# Patient Record
Sex: Male | Born: 1940
Health system: Southern US, Community
[De-identification: ages and names within clinical notes are randomized; demographics above are authoritative.]

## PROBLEM LIST (undated history)

## (undated) DIAGNOSIS — I6523 Occlusion and stenosis of bilateral carotid arteries: Secondary | ICD-10-CM

## (undated) DIAGNOSIS — N400 Enlarged prostate without lower urinary tract symptoms: Secondary | ICD-10-CM

## (undated) DIAGNOSIS — R897 Abnormal histological findings in specimens from other organs, systems and tissues: Secondary | ICD-10-CM

## (undated) DIAGNOSIS — I6930 Unspecified sequelae of cerebral infarction: Secondary | ICD-10-CM

## (undated) DIAGNOSIS — R972 Elevated prostate specific antigen [PSA]: Secondary | ICD-10-CM

## (undated) DIAGNOSIS — F329 Major depressive disorder, single episode, unspecified: Secondary | ICD-10-CM

## (undated) DIAGNOSIS — K219 Gastro-esophageal reflux disease without esophagitis: Secondary | ICD-10-CM

## (undated) DIAGNOSIS — R0600 Dyspnea, unspecified: Secondary | ICD-10-CM

## (undated) DIAGNOSIS — E785 Hyperlipidemia, unspecified: Secondary | ICD-10-CM

## (undated) DIAGNOSIS — Z7901 Long term (current) use of anticoagulants: Secondary | ICD-10-CM

## (undated) DIAGNOSIS — Z955 Presence of coronary angioplasty implant and graft: Secondary | ICD-10-CM

## (undated) DIAGNOSIS — Z8673 Personal history of transient ischemic attack (TIA), and cerebral infarction without residual deficits: Secondary | ICD-10-CM

## (undated) DIAGNOSIS — G4733 Obstructive sleep apnea (adult) (pediatric): Secondary | ICD-10-CM

## (undated) DIAGNOSIS — F32A Depression, unspecified: Secondary | ICD-10-CM

## (undated) DIAGNOSIS — Z86711 Personal history of pulmonary embolism: Secondary | ICD-10-CM

## (undated) DIAGNOSIS — T8859XA Other complications of anesthesia, initial encounter: Secondary | ICD-10-CM

## (undated) DIAGNOSIS — M51369 Other intervertebral disc degeneration, lumbar region without mention of lumbar back pain or lower extremity pain: Secondary | ICD-10-CM

## (undated) DIAGNOSIS — N281 Cyst of kidney, acquired: Secondary | ICD-10-CM

## (undated) DIAGNOSIS — E119 Type 2 diabetes mellitus without complications: Secondary | ICD-10-CM

## (undated) DIAGNOSIS — R0609 Other forms of dyspnea: Secondary | ICD-10-CM

## (undated) DIAGNOSIS — I2510 Atherosclerotic heart disease of native coronary artery without angina pectoris: Secondary | ICD-10-CM

## (undated) DIAGNOSIS — IMO0002 Reserved for concepts with insufficient information to code with codable children: Secondary | ICD-10-CM

## (undated) DIAGNOSIS — C61 Malignant neoplasm of prostate: Secondary | ICD-10-CM

## (undated) DIAGNOSIS — G629 Polyneuropathy, unspecified: Secondary | ICD-10-CM

## (undated) DIAGNOSIS — N182 Chronic kidney disease, stage 2 (mild): Secondary | ICD-10-CM

## (undated) DIAGNOSIS — M5136 Other intervertebral disc degeneration, lumbar region: Secondary | ICD-10-CM

## (undated) DIAGNOSIS — Z87898 Personal history of other specified conditions: Secondary | ICD-10-CM

## (undated) DIAGNOSIS — Z9989 Dependence on other enabling machines and devices: Secondary | ICD-10-CM

## (undated) DIAGNOSIS — T4145XA Adverse effect of unspecified anesthetic, initial encounter: Secondary | ICD-10-CM

## (undated) DIAGNOSIS — I639 Cerebral infarction, unspecified: Secondary | ICD-10-CM

## (undated) DIAGNOSIS — I6502 Occlusion and stenosis of left vertebral artery: Secondary | ICD-10-CM

## (undated) DIAGNOSIS — F419 Anxiety disorder, unspecified: Secondary | ICD-10-CM

## (undated) DIAGNOSIS — M5030 Other cervical disc degeneration, unspecified cervical region: Secondary | ICD-10-CM

## (undated) DIAGNOSIS — I10 Essential (primary) hypertension: Secondary | ICD-10-CM

## (undated) DIAGNOSIS — N289 Disorder of kidney and ureter, unspecified: Secondary | ICD-10-CM

## (undated) DIAGNOSIS — R2681 Unsteadiness on feet: Secondary | ICD-10-CM

## (undated) HISTORY — PX: CARPAL TUNNEL RELEASE: SHX101

## (undated) HISTORY — PX: CARDIAC CATHETERIZATION: SHX172

## (undated) HISTORY — PX: CATARACT EXTRACTION W/ INTRAOCULAR LENS  IMPLANT, BILATERAL: SHX1307

## (undated) HISTORY — PX: UMBILICAL HERNIA REPAIR: SHX196

## (undated) HISTORY — DX: Other cervical disc degeneration, unspecified cervical region: M50.30

## (undated) HISTORY — PX: CARDIOVASCULAR STRESS TEST: SHX262

## (undated) HISTORY — PX: COLONOSCOPY: SHX174

## (undated) HISTORY — DX: Reserved for concepts with insufficient information to code with codable children: IMO0002

## (undated) HISTORY — DX: Unsteadiness on feet: R26.81

---

## 2002-01-09 HISTORY — PX: NEUROPLASTY / TRANSPOSITION ULNAR NERVE AT ELBOW: SUR895

## 2002-01-09 HISTORY — PX: ANTERIOR CERVICAL DECOMP/DISCECTOMY FUSION: SHX1161

## 2003-01-10 DIAGNOSIS — J189 Pneumonia, unspecified organism: Secondary | ICD-10-CM

## 2003-01-10 HISTORY — DX: Pneumonia, unspecified organism: J18.9

## 2003-01-10 HISTORY — PX: OTHER SURGICAL HISTORY: SHX169

## 2004-09-01 ENCOUNTER — Emergency Department (HOSPITAL_COMMUNITY): Admission: EM | Admit: 2004-09-01 | Discharge: 2004-09-01 | Payer: Self-pay | Admitting: Emergency Medicine

## 2005-05-18 ENCOUNTER — Emergency Department (HOSPITAL_COMMUNITY): Admission: EM | Admit: 2005-05-18 | Discharge: 2005-05-19 | Payer: Self-pay | Admitting: Emergency Medicine

## 2005-11-27 ENCOUNTER — Encounter: Admission: RE | Admit: 2005-11-27 | Discharge: 2006-01-12 | Payer: Self-pay | Admitting: Neurosurgery

## 2006-07-19 ENCOUNTER — Emergency Department (HOSPITAL_COMMUNITY): Admission: EM | Admit: 2006-07-19 | Discharge: 2006-07-19 | Payer: Self-pay | Admitting: Emergency Medicine

## 2006-08-01 ENCOUNTER — Ambulatory Visit (HOSPITAL_COMMUNITY): Admission: RE | Admit: 2006-08-01 | Discharge: 2006-08-01 | Payer: Self-pay | Admitting: Emergency Medicine

## 2006-08-08 ENCOUNTER — Encounter: Admission: RE | Admit: 2006-08-08 | Discharge: 2006-11-01 | Payer: Self-pay | Admitting: Neurosurgery

## 2006-11-22 ENCOUNTER — Observation Stay (HOSPITAL_COMMUNITY): Admission: EM | Admit: 2006-11-22 | Discharge: 2006-11-23 | Payer: Self-pay | Admitting: Emergency Medicine

## 2007-05-30 ENCOUNTER — Emergency Department (HOSPITAL_COMMUNITY): Admission: EM | Admit: 2007-05-30 | Discharge: 2007-05-30 | Payer: Self-pay | Admitting: Emergency Medicine

## 2008-01-10 DIAGNOSIS — Z955 Presence of coronary angioplasty implant and graft: Secondary | ICD-10-CM

## 2008-01-10 HISTORY — DX: Presence of coronary angioplasty implant and graft: Z95.5

## 2008-02-03 ENCOUNTER — Encounter
Admission: RE | Admit: 2008-02-03 | Discharge: 2008-02-03 | Payer: Self-pay | Admitting: Physical Medicine and Rehabilitation

## 2008-02-10 HISTORY — PX: CORONARY ANGIOPLASTY WITH STENT PLACEMENT: SHX49

## 2008-02-26 ENCOUNTER — Inpatient Hospital Stay (HOSPITAL_COMMUNITY): Admission: EM | Admit: 2008-02-26 | Discharge: 2008-02-27 | Payer: Self-pay | Admitting: Emergency Medicine

## 2008-03-05 ENCOUNTER — Encounter (HOSPITAL_COMMUNITY): Admission: RE | Admit: 2008-03-05 | Discharge: 2008-06-03 | Payer: Self-pay | Admitting: Interventional Cardiology

## 2008-06-04 ENCOUNTER — Encounter (HOSPITAL_COMMUNITY): Admission: RE | Admit: 2008-06-04 | Discharge: 2008-08-09 | Payer: Self-pay | Admitting: Interventional Cardiology

## 2008-06-17 ENCOUNTER — Encounter: Admission: RE | Admit: 2008-06-17 | Discharge: 2008-06-17 | Payer: Self-pay | Admitting: Family Medicine

## 2008-07-09 ENCOUNTER — Encounter: Admission: RE | Admit: 2008-07-09 | Discharge: 2008-09-01 | Payer: Self-pay | Admitting: Neurology

## 2009-02-05 ENCOUNTER — Encounter (INDEPENDENT_AMBULATORY_CARE_PROVIDER_SITE_OTHER): Payer: Self-pay | Admitting: Internal Medicine

## 2009-02-05 ENCOUNTER — Inpatient Hospital Stay (HOSPITAL_COMMUNITY)
Admission: EM | Admit: 2009-02-05 | Discharge: 2009-02-07 | Payer: Self-pay | Source: Home / Self Care | Admitting: Emergency Medicine

## 2009-02-05 ENCOUNTER — Ambulatory Visit: Payer: Self-pay | Admitting: Vascular Surgery

## 2009-03-08 ENCOUNTER — Encounter: Admission: RE | Admit: 2009-03-08 | Discharge: 2009-06-06 | Payer: Self-pay | Admitting: Neurology

## 2009-06-03 ENCOUNTER — Encounter: Admission: RE | Admit: 2009-06-03 | Discharge: 2009-06-03 | Payer: Self-pay | Admitting: Unknown Physician Specialty

## 2009-06-14 ENCOUNTER — Encounter: Admission: RE | Admit: 2009-06-14 | Discharge: 2009-06-24 | Payer: Self-pay | Admitting: Family Medicine

## 2009-06-25 ENCOUNTER — Encounter: Admission: RE | Admit: 2009-06-25 | Discharge: 2009-06-25 | Payer: Self-pay | Admitting: Unknown Physician Specialty

## 2009-08-26 ENCOUNTER — Encounter: Admission: RE | Admit: 2009-08-26 | Discharge: 2009-10-07 | Payer: Self-pay | Admitting: Family Medicine

## 2009-10-06 ENCOUNTER — Ambulatory Visit: Payer: Self-pay | Admitting: Cardiology

## 2009-11-14 ENCOUNTER — Inpatient Hospital Stay (HOSPITAL_COMMUNITY): Admission: EM | Admit: 2009-11-14 | Discharge: 2009-11-16 | Payer: Self-pay | Admitting: Emergency Medicine

## 2010-02-01 ENCOUNTER — Ambulatory Visit: Payer: Self-pay | Admitting: Cardiology

## 2010-02-23 ENCOUNTER — Ambulatory Visit: Payer: Medicare Other | Attending: Neurology | Admitting: Physical Therapy

## 2010-02-23 DIAGNOSIS — M545 Low back pain, unspecified: Secondary | ICD-10-CM | POA: Insufficient documentation

## 2010-02-23 DIAGNOSIS — IMO0001 Reserved for inherently not codable concepts without codable children: Secondary | ICD-10-CM | POA: Insufficient documentation

## 2010-02-23 DIAGNOSIS — M2560 Stiffness of unspecified joint, not elsewhere classified: Secondary | ICD-10-CM | POA: Insufficient documentation

## 2010-02-28 ENCOUNTER — Ambulatory Visit: Payer: Medicare Other | Admitting: Physical Therapy

## 2010-03-02 ENCOUNTER — Ambulatory Visit: Payer: Medicare Other | Admitting: Physical Therapy

## 2010-03-07 ENCOUNTER — Ambulatory Visit: Payer: Medicare Other | Admitting: Physical Therapy

## 2010-03-09 ENCOUNTER — Ambulatory Visit: Payer: Medicare Other | Admitting: Physical Therapy

## 2010-03-09 ENCOUNTER — Encounter: Payer: MEDICARE | Admitting: Physical Therapy

## 2010-03-10 ENCOUNTER — Encounter: Payer: Medicare Other | Admitting: Physical Therapy

## 2010-03-16 ENCOUNTER — Ambulatory Visit: Payer: Medicare Other | Attending: Internal Medicine | Admitting: Physical Therapy

## 2010-03-16 DIAGNOSIS — IMO0001 Reserved for inherently not codable concepts without codable children: Secondary | ICD-10-CM | POA: Insufficient documentation

## 2010-03-16 DIAGNOSIS — M545 Low back pain, unspecified: Secondary | ICD-10-CM | POA: Insufficient documentation

## 2010-03-16 DIAGNOSIS — M2560 Stiffness of unspecified joint, not elsewhere classified: Secondary | ICD-10-CM | POA: Insufficient documentation

## 2010-03-17 ENCOUNTER — Encounter: Payer: MEDICARE | Admitting: Physical Therapy

## 2010-03-22 ENCOUNTER — Ambulatory Visit: Payer: Medicare Other | Admitting: Physical Therapy

## 2010-03-22 LAB — URINE MICROSCOPIC-ADD ON

## 2010-03-22 LAB — URINE CULTURE
Colony Count: NO GROWTH
Culture  Setup Time: 201111062124
Culture: NO GROWTH

## 2010-03-22 LAB — DIFFERENTIAL
Basophils Absolute: 0 10*3/uL (ref 0.0–0.1)
Basophils Relative: 0 % (ref 0–1)
Eosinophils Absolute: 0 10*3/uL (ref 0.0–0.7)
Eosinophils Relative: 1 % (ref 0–5)
Lymphocytes Relative: 24 % (ref 12–46)
Lymphs Abs: 1.7 10*3/uL (ref 0.7–4.0)
Monocytes Absolute: 0.7 10*3/uL (ref 0.1–1.0)
Monocytes Relative: 10 % (ref 3–12)
Neutro Abs: 4.5 10*3/uL (ref 1.7–7.7)
Neutrophils Relative %: 65 % (ref 43–77)

## 2010-03-22 LAB — CBC
HCT: 35.8 % — ABNORMAL LOW (ref 39.0–52.0)
Hemoglobin: 12.1 g/dL — ABNORMAL LOW (ref 13.0–17.0)
MCH: 32.4 pg (ref 26.0–34.0)
MCHC: 33.8 g/dL (ref 30.0–36.0)
MCV: 96 fL (ref 78.0–100.0)
Platelets: 186 10*3/uL (ref 150–400)
RBC: 3.73 MIL/uL — ABNORMAL LOW (ref 4.22–5.81)
RDW: 14.3 % (ref 11.5–15.5)
WBC: 6.9 10*3/uL (ref 4.0–10.5)

## 2010-03-22 LAB — GLUCOSE, CAPILLARY
Glucose-Capillary: 121 mg/dL — ABNORMAL HIGH (ref 70–99)
Glucose-Capillary: 143 mg/dL — ABNORMAL HIGH (ref 70–99)
Glucose-Capillary: 156 mg/dL — ABNORMAL HIGH (ref 70–99)
Glucose-Capillary: 160 mg/dL — ABNORMAL HIGH (ref 70–99)
Glucose-Capillary: 172 mg/dL — ABNORMAL HIGH (ref 70–99)
Glucose-Capillary: 206 mg/dL — ABNORMAL HIGH (ref 70–99)
Glucose-Capillary: 226 mg/dL — ABNORMAL HIGH (ref 70–99)

## 2010-03-22 LAB — CARDIAC PANEL(CRET KIN+CKTOT+MB+TROPI)
CK, MB: 2.2 ng/mL (ref 0.3–4.0)
Relative Index: 2.1 (ref 0.0–2.5)
Total CK: 103 U/L (ref 7–232)
Troponin I: <0.01 ng/mL (ref 0.00–0.06)

## 2010-03-22 LAB — URINALYSIS, ROUTINE W REFLEX MICROSCOPIC
Bilirubin Urine: NEGATIVE
Glucose, UA: 250 mg/dL — AB
Hgb urine dipstick: NEGATIVE
Ketones, ur: NEGATIVE mg/dL
Nitrite: NEGATIVE
Protein, ur: NEGATIVE mg/dL
Specific Gravity, Urine: 1.019 (ref 1.005–1.030)
Urobilinogen, UA: 0.2 mg/dL (ref 0.0–1.0)
pH: 5.5 (ref 5.0–8.0)

## 2010-03-22 LAB — POCT I-STAT, CHEM 8
BUN: 18 mg/dL (ref 6–23)
Calcium, Ion: 1.2 mmol/L (ref 1.12–1.32)
Chloride: 104 mEq/L (ref 96–112)
Creatinine, Ser: 1.2 mg/dL (ref 0.4–1.5)
Glucose, Bld: 119 mg/dL — ABNORMAL HIGH (ref 70–99)
HCT: 39 % (ref 39.0–52.0)
Hemoglobin: 13.3 g/dL (ref 13.0–17.0)
Potassium: 4.7 mEq/L (ref 3.5–5.1)
Sodium: 138 mEq/L (ref 135–145)
TCO2: 27 mmol/L (ref 0–100)

## 2010-03-22 LAB — POCT CARDIAC MARKERS
CKMB, poc: <1 ng/mL — ABNORMAL LOW (ref 1.0–8.0)
Myoglobin, poc: 63.6 ng/mL (ref 12–200)
Troponin i, poc: <0.05 ng/mL (ref 0.00–0.09)

## 2010-03-22 LAB — CK TOTAL AND CKMB (NOT AT ARMC)
CK, MB: 2.2 ng/mL (ref 0.3–4.0)
Relative Index: 1.2 (ref 0.0–2.5)
Total CK: 178 U/L (ref 7–232)

## 2010-03-22 LAB — TSH: TSH: 0.547 u[IU]/mL (ref 0.350–4.500)

## 2010-03-22 LAB — HEMOGLOBIN A1C
Hgb A1c MFr Bld: 8.3 % — ABNORMAL HIGH (ref <5.7)
Mean Plasma Glucose: 192 mg/dL — ABNORMAL HIGH (ref <117)

## 2010-03-22 LAB — PROTIME-INR
INR: 0.99 (ref 0.00–1.49)
Prothrombin Time: 13.3 seconds (ref 11.6–15.2)

## 2010-03-22 LAB — TROPONIN I: Troponin I: <0.01 ng/mL (ref 0.00–0.06)

## 2010-03-22 LAB — D-DIMER, QUANTITATIVE: D-Dimer, Quant: 0.94 ug/mL-FEU — ABNORMAL HIGH (ref 0.00–0.48)

## 2010-03-27 LAB — DIFFERENTIAL
Basophils Absolute: 0 10*3/uL (ref 0.0–0.1)
Basophils Absolute: 0 10*3/uL (ref 0.0–0.1)
Basophils Absolute: 0 10*3/uL (ref 0.0–0.1)
Basophils Relative: 0 % (ref 0–1)
Basophils Relative: 0 % (ref 0–1)
Basophils Relative: 1 % (ref 0–1)
Eosinophils Absolute: 0 10*3/uL (ref 0.0–0.7)
Eosinophils Absolute: 0.1 10*3/uL (ref 0.0–0.7)
Eosinophils Absolute: 0.2 10*3/uL (ref 0.0–0.7)
Eosinophils Relative: 0 % (ref 0–5)
Eosinophils Relative: 1 % (ref 0–5)
Eosinophils Relative: 2 % (ref 0–5)
Lymphocytes Relative: 13 % (ref 12–46)
Lymphocytes Relative: 17 % (ref 12–46)
Lymphocytes Relative: 24 % (ref 12–46)
Lymphs Abs: 1.2 10*3/uL (ref 0.7–4.0)
Lymphs Abs: 1.2 10*3/uL (ref 0.7–4.0)
Lymphs Abs: 1.7 10*3/uL (ref 0.7–4.0)
Monocytes Absolute: 0.8 10*3/uL (ref 0.1–1.0)
Monocytes Absolute: 0.9 10*3/uL (ref 0.1–1.0)
Monocytes Absolute: 1 10*3/uL (ref 0.1–1.0)
Monocytes Relative: 11 % (ref 3–12)
Monocytes Relative: 11 % (ref 3–12)
Monocytes Relative: 13 % — ABNORMAL HIGH (ref 3–12)
Neutro Abs: 4.4 10*3/uL (ref 1.7–7.7)
Neutro Abs: 4.8 10*3/uL (ref 1.7–7.7)
Neutro Abs: 6.5 10*3/uL (ref 1.7–7.7)
Neutrophils Relative %: 62 % (ref 43–77)
Neutrophils Relative %: 69 % (ref 43–77)
Neutrophils Relative %: 75 % (ref 43–77)
WBC Morphology: INCREASED

## 2010-03-27 LAB — CBC
HCT: 33.1 % — ABNORMAL LOW (ref 39.0–52.0)
HCT: 34.9 % — ABNORMAL LOW (ref 39.0–52.0)
HCT: 36 % — ABNORMAL LOW (ref 39.0–52.0)
Hemoglobin: 11 g/dL — ABNORMAL LOW (ref 13.0–17.0)
Hemoglobin: 12.2 g/dL — ABNORMAL LOW (ref 13.0–17.0)
Hemoglobin: 12.4 g/dL — ABNORMAL LOW (ref 13.0–17.0)
MCHC: 33.3 g/dL (ref 30.0–36.0)
MCHC: 34.4 g/dL (ref 30.0–36.0)
MCHC: 34.9 g/dL (ref 30.0–36.0)
MCV: 95.1 fL (ref 78.0–100.0)
MCV: 97 fL (ref 78.0–100.0)
MCV: 97.7 fL (ref 78.0–100.0)
Platelets: 124 10*3/uL — ABNORMAL LOW (ref 150–400)
Platelets: 138 10*3/uL — ABNORMAL LOW (ref 150–400)
Platelets: 148 10*3/uL — ABNORMAL LOW (ref 150–400)
RBC: 3.41 MIL/uL — ABNORMAL LOW (ref 4.22–5.81)
RBC: 3.67 MIL/uL — ABNORMAL LOW (ref 4.22–5.81)
RBC: 3.68 MIL/uL — ABNORMAL LOW (ref 4.22–5.81)
RDW: 14.3 % (ref 11.5–15.5)
RDW: 14.5 % (ref 11.5–15.5)
RDW: 14.5 % (ref 11.5–15.5)
WBC: 7 10*3/uL (ref 4.0–10.5)
WBC: 7 10*3/uL (ref 4.0–10.5)
WBC: 8.7 10*3/uL (ref 4.0–10.5)

## 2010-03-27 LAB — BASIC METABOLIC PANEL
BUN: 16 mg/dL (ref 6–23)
BUN: 22 mg/dL (ref 6–23)
CO2: 27 mEq/L (ref 19–32)
CO2: 28 mEq/L (ref 19–32)
Calcium: 8 mg/dL — ABNORMAL LOW (ref 8.4–10.5)
Calcium: 8.5 mg/dL (ref 8.4–10.5)
Chloride: 101 mEq/L (ref 96–112)
Chloride: 103 mEq/L (ref 96–112)
Creatinine, Ser: 1.33 mg/dL (ref 0.4–1.5)
Creatinine, Ser: 1.47 mg/dL (ref 0.4–1.5)
GFR calc Af Amer: 58 mL/min — ABNORMAL LOW (ref >60)
GFR calc Af Amer: >60 mL/min (ref >60)
GFR calc non Af Amer: 48 mL/min — ABNORMAL LOW (ref >60)
GFR calc non Af Amer: 54 mL/min — ABNORMAL LOW (ref >60)
Glucose, Bld: 216 mg/dL — ABNORMAL HIGH (ref 70–99)
Glucose, Bld: 324 mg/dL — ABNORMAL HIGH (ref 70–99)
Potassium: 4.2 mEq/L (ref 3.5–5.1)
Potassium: 4.2 mEq/L (ref 3.5–5.1)
Sodium: 137 mEq/L (ref 135–145)
Sodium: 138 mEq/L (ref 135–145)

## 2010-03-27 LAB — COMPREHENSIVE METABOLIC PANEL
ALT: 18 U/L (ref 0–53)
ALT: 21 U/L (ref 0–53)
AST: 17 U/L (ref 0–37)
AST: 33 U/L (ref 0–37)
Albumin: 2.7 g/dL — ABNORMAL LOW (ref 3.5–5.2)
Albumin: 3 g/dL — ABNORMAL LOW (ref 3.5–5.2)
Alkaline Phosphatase: 35 U/L — ABNORMAL LOW (ref 39–117)
Alkaline Phosphatase: 36 U/L — ABNORMAL LOW (ref 39–117)
BUN: 20 mg/dL (ref 6–23)
BUN: 22 mg/dL (ref 6–23)
CO2: 24 mEq/L (ref 19–32)
CO2: 28 mEq/L (ref 19–32)
Calcium: 7.7 mg/dL — ABNORMAL LOW (ref 8.4–10.5)
Calcium: 8.1 mg/dL — ABNORMAL LOW (ref 8.4–10.5)
Chloride: 105 mEq/L (ref 96–112)
Chloride: 107 mEq/L (ref 96–112)
Creatinine, Ser: 1.3 mg/dL (ref 0.4–1.5)
Creatinine, Ser: 1.33 mg/dL (ref 0.4–1.5)
GFR calc Af Amer: >60 mL/min (ref >60)
GFR calc Af Amer: >60 mL/min (ref >60)
GFR calc non Af Amer: 54 mL/min — ABNORMAL LOW (ref >60)
GFR calc non Af Amer: 55 mL/min — ABNORMAL LOW (ref >60)
Glucose, Bld: 146 mg/dL — ABNORMAL HIGH (ref 70–99)
Glucose, Bld: 301 mg/dL — ABNORMAL HIGH (ref 70–99)
Potassium: 3.7 mEq/L (ref 3.5–5.1)
Potassium: 5.5 mEq/L — ABNORMAL HIGH (ref 3.5–5.1)
Sodium: 138 mEq/L (ref 135–145)
Sodium: 141 mEq/L (ref 135–145)
Total Bilirubin: 0.5 mg/dL (ref 0.3–1.2)
Total Bilirubin: 2 mg/dL — ABNORMAL HIGH (ref 0.3–1.2)
Total Protein: 5.7 g/dL — ABNORMAL LOW (ref 6.0–8.3)
Total Protein: 5.9 g/dL — ABNORMAL LOW (ref 6.0–8.3)

## 2010-03-27 LAB — POCT CARDIAC MARKERS
CKMB, poc: <1 ng/mL — ABNORMAL LOW (ref 1.0–8.0)
CKMB, poc: <1 ng/mL — ABNORMAL LOW (ref 1.0–8.0)
Myoglobin, poc: 91.2 ng/mL (ref 12–200)
Myoglobin, poc: 93.8 ng/mL (ref 12–200)
Troponin i, poc: <0.05 ng/mL (ref 0.00–0.09)
Troponin i, poc: <0.05 ng/mL (ref 0.00–0.09)

## 2010-03-27 LAB — CK TOTAL AND CKMB (NOT AT ARMC)
CK, MB: 1.1 ng/mL (ref 0.3–4.0)
Relative Index: 0.7 (ref 0.0–2.5)
Total CK: 166 U/L (ref 7–232)

## 2010-03-27 LAB — URINE MICROSCOPIC-ADD ON

## 2010-03-27 LAB — URINALYSIS, ROUTINE W REFLEX MICROSCOPIC
Glucose, UA: NEGATIVE mg/dL
Hgb urine dipstick: NEGATIVE
Ketones, ur: 15 mg/dL — AB
Nitrite: NEGATIVE
Protein, ur: NEGATIVE mg/dL
Specific Gravity, Urine: 1.024 (ref 1.005–1.030)
Urobilinogen, UA: 0.2 mg/dL (ref 0.0–1.0)
pH: 5.5 (ref 5.0–8.0)

## 2010-03-27 LAB — CULTURE, BLOOD (ROUTINE X 2)
Culture: NO GROWTH
Culture: NO GROWTH

## 2010-03-27 LAB — GLUCOSE, CAPILLARY
Glucose-Capillary: 126 mg/dL — ABNORMAL HIGH (ref 70–99)
Glucose-Capillary: 165 mg/dL — ABNORMAL HIGH (ref 70–99)
Glucose-Capillary: 187 mg/dL — ABNORMAL HIGH (ref 70–99)
Glucose-Capillary: 193 mg/dL — ABNORMAL HIGH (ref 70–99)
Glucose-Capillary: 214 mg/dL — ABNORMAL HIGH (ref 70–99)
Glucose-Capillary: 236 mg/dL — ABNORMAL HIGH (ref 70–99)
Glucose-Capillary: 242 mg/dL — ABNORMAL HIGH (ref 70–99)
Glucose-Capillary: 303 mg/dL — ABNORMAL HIGH (ref 70–99)
Glucose-Capillary: 383 mg/dL — ABNORMAL HIGH (ref 70–99)
Glucose-Capillary: 415 mg/dL — ABNORMAL HIGH (ref 70–99)

## 2010-03-27 LAB — MAGNESIUM
Magnesium: 1.9 mg/dL (ref 1.5–2.5)
Magnesium: 2.2 mg/dL (ref 1.5–2.5)

## 2010-03-27 LAB — CARDIAC PANEL(CRET KIN+CKTOT+MB+TROPI)
CK, MB: 1 ng/mL (ref 0.3–4.0)
CK, MB: 1.4 ng/mL (ref 0.3–4.0)
Relative Index: 0.6 (ref 0.0–2.5)
Relative Index: 0.8 (ref 0.0–2.5)
Total CK: 172 U/L (ref 7–232)
Total CK: 177 U/L (ref 7–232)
Troponin I: 0.02 ng/mL (ref 0.00–0.06)
Troponin I: 0.05 ng/mL (ref 0.00–0.06)

## 2010-03-27 LAB — TROPONIN I: Troponin I: 0.01 ng/mL (ref 0.00–0.06)

## 2010-03-29 ENCOUNTER — Ambulatory Visit: Payer: Medicare Other | Admitting: Physical Therapy

## 2010-04-06 ENCOUNTER — Ambulatory Visit: Payer: Medicare Other | Admitting: Physical Therapy

## 2010-04-13 ENCOUNTER — Ambulatory Visit: Payer: Medicare Other | Attending: Neurology | Admitting: Physical Therapy

## 2010-04-13 DIAGNOSIS — M545 Low back pain, unspecified: Secondary | ICD-10-CM | POA: Insufficient documentation

## 2010-04-13 DIAGNOSIS — IMO0001 Reserved for inherently not codable concepts without codable children: Secondary | ICD-10-CM | POA: Insufficient documentation

## 2010-04-13 DIAGNOSIS — M2560 Stiffness of unspecified joint, not elsewhere classified: Secondary | ICD-10-CM | POA: Insufficient documentation

## 2010-04-19 LAB — GLUCOSE, CAPILLARY: Glucose-Capillary: 109 mg/dL — ABNORMAL HIGH (ref 70–99)

## 2010-04-20 ENCOUNTER — Ambulatory Visit: Payer: Medicare Other | Admitting: Physical Therapy

## 2010-04-20 LAB — GLUCOSE, CAPILLARY
Glucose-Capillary: 103 mg/dL — ABNORMAL HIGH (ref 70–99)
Glucose-Capillary: 86 mg/dL (ref 70–99)

## 2010-04-21 LAB — GLUCOSE, CAPILLARY
Glucose-Capillary: 100 mg/dL — ABNORMAL HIGH (ref 70–99)
Glucose-Capillary: 136 mg/dL — ABNORMAL HIGH (ref 70–99)
Glucose-Capillary: 100 mg/dL — ABNORMAL HIGH (ref 70–99)
Glucose-Capillary: 102 mg/dL — ABNORMAL HIGH (ref 70–99)
Glucose-Capillary: 102 mg/dL — ABNORMAL HIGH (ref 70–99)
Glucose-Capillary: 103 mg/dL — ABNORMAL HIGH (ref 70–99)
Glucose-Capillary: 104 mg/dL — ABNORMAL HIGH (ref 70–99)
Glucose-Capillary: 104 mg/dL — ABNORMAL HIGH (ref 70–99)
Glucose-Capillary: 110 mg/dL — ABNORMAL HIGH (ref 70–99)
Glucose-Capillary: 111 mg/dL — ABNORMAL HIGH (ref 70–99)
Glucose-Capillary: 117 mg/dL — ABNORMAL HIGH (ref 70–99)
Glucose-Capillary: 143 mg/dL — ABNORMAL HIGH (ref 70–99)
Glucose-Capillary: 62 mg/dL — ABNORMAL LOW (ref 70–99)
Glucose-Capillary: 63 mg/dL — ABNORMAL LOW (ref 70–99)
Glucose-Capillary: 66 mg/dL — ABNORMAL LOW (ref 70–99)
Glucose-Capillary: 70 mg/dL (ref 70–99)
Glucose-Capillary: 95 mg/dL (ref 70–99)

## 2010-04-22 ENCOUNTER — Encounter: Payer: Medicare Other | Admitting: Physical Therapy

## 2010-04-26 LAB — CBC
HCT: 33.8 % — ABNORMAL LOW (ref 39.0–52.0)
HCT: 35.7 % — ABNORMAL LOW (ref 39.0–52.0)
HCT: 38.4 % — ABNORMAL LOW (ref 39.0–52.0)
HCT: 38.6 % — ABNORMAL LOW (ref 39.0–52.0)
Hemoglobin: 11.7 g/dL — ABNORMAL LOW (ref 13.0–17.0)
Hemoglobin: 12.4 g/dL — ABNORMAL LOW (ref 13.0–17.0)
Hemoglobin: 13.1 g/dL (ref 13.0–17.0)
Hemoglobin: 13.4 g/dL (ref 13.0–17.0)
MCHC: 34.1 g/dL (ref 30.0–36.0)
MCHC: 34.6 g/dL (ref 30.0–36.0)
MCHC: 34.7 g/dL (ref 30.0–36.0)
MCHC: 34.7 g/dL (ref 30.0–36.0)
MCV: 97.8 fL (ref 78.0–100.0)
MCV: 98 fL (ref 78.0–100.0)
MCV: 98.2 fL (ref 78.0–100.0)
MCV: 99.1 fL (ref 78.0–100.0)
Platelets: 166 10*3/uL (ref 150–400)
Platelets: 180 10*3/uL (ref 150–400)
Platelets: 191 10*3/uL (ref 150–400)
Platelets: 192 10*3/uL (ref 150–400)
RBC: 3.44 MIL/uL — ABNORMAL LOW (ref 4.22–5.81)
RBC: 3.65 MIL/uL — ABNORMAL LOW (ref 4.22–5.81)
RBC: 3.87 MIL/uL — ABNORMAL LOW (ref 4.22–5.81)
RBC: 3.95 MIL/uL — ABNORMAL LOW (ref 4.22–5.81)
RDW: 13.6 % (ref 11.5–15.5)
RDW: 13.9 % (ref 11.5–15.5)
RDW: 14 % (ref 11.5–15.5)
RDW: 14 % (ref 11.5–15.5)
WBC: 3.7 10*3/uL — ABNORMAL LOW (ref 4.0–10.5)
WBC: 5.6 10*3/uL (ref 4.0–10.5)
WBC: 8.6 10*3/uL (ref 4.0–10.5)
WBC: 9.3 10*3/uL (ref 4.0–10.5)

## 2010-04-26 LAB — PROTIME-INR
INR: 1 (ref 0.00–1.49)
Prothrombin Time: 13.3 seconds (ref 11.6–15.2)

## 2010-04-26 LAB — COMPREHENSIVE METABOLIC PANEL
ALT: 29 U/L (ref 0–53)
AST: 24 U/L (ref 0–37)
Albumin: 3.5 g/dL (ref 3.5–5.2)
Alkaline Phosphatase: 53 U/L (ref 39–117)
BUN: 21 mg/dL (ref 6–23)
CO2: 24 mEq/L (ref 19–32)
Calcium: 8.7 mg/dL (ref 8.4–10.5)
Chloride: 106 mEq/L (ref 96–112)
Creatinine, Ser: 1.13 mg/dL (ref 0.4–1.5)
GFR calc Af Amer: >60 mL/min (ref >60)
GFR calc non Af Amer: >60 mL/min (ref >60)
Glucose, Bld: 77 mg/dL (ref 70–99)
Potassium: 4.7 mEq/L (ref 3.5–5.1)
Sodium: 139 mEq/L (ref 135–145)
Total Bilirubin: 0.6 mg/dL (ref 0.3–1.2)
Total Protein: 5.7 g/dL — ABNORMAL LOW (ref 6.0–8.3)

## 2010-04-26 LAB — DIFFERENTIAL
Basophils Absolute: 0 10*3/uL (ref 0.0–0.1)
Basophils Relative: 1 % (ref 0–1)
Eosinophils Absolute: 0.1 10*3/uL (ref 0.0–0.7)
Eosinophils Relative: 1 % (ref 0–5)
Lymphocytes Relative: 35 % (ref 12–46)
Lymphs Abs: 2 10*3/uL (ref 0.7–4.0)
Monocytes Absolute: 0.4 10*3/uL (ref 0.1–1.0)
Monocytes Relative: 8 % (ref 3–12)
Neutro Abs: 3.1 10*3/uL (ref 1.7–7.7)
Neutrophils Relative %: 56 % (ref 43–77)

## 2010-04-26 LAB — GLUCOSE, CAPILLARY
Glucose-Capillary: 129 mg/dL — ABNORMAL HIGH (ref 70–99)
Glucose-Capillary: 149 mg/dL — ABNORMAL HIGH (ref 70–99)
Glucose-Capillary: 162 mg/dL — ABNORMAL HIGH (ref 70–99)
Glucose-Capillary: 171 mg/dL — ABNORMAL HIGH (ref 70–99)
Glucose-Capillary: 178 mg/dL — ABNORMAL HIGH (ref 70–99)
Glucose-Capillary: 219 mg/dL — ABNORMAL HIGH (ref 70–99)
Glucose-Capillary: 233 mg/dL — ABNORMAL HIGH (ref 70–99)
Glucose-Capillary: 253 mg/dL — ABNORMAL HIGH (ref 70–99)
Glucose-Capillary: 308 mg/dL — ABNORMAL HIGH (ref 70–99)

## 2010-04-26 LAB — CK TOTAL AND CKMB (NOT AT ARMC)
CK, MB: 3.5 ng/mL (ref 0.3–4.0)
Relative Index: 2.7 — ABNORMAL HIGH (ref 0.0–2.5)
Total CK: 130 U/L (ref 7–232)

## 2010-04-26 LAB — CARDIAC PANEL(CRET KIN+CKTOT+MB+TROPI)
CK, MB: 3.3 ng/mL (ref 0.3–4.0)
CK, MB: 3.4 ng/mL (ref 0.3–4.0)
Relative Index: 2.4 (ref 0.0–2.5)
Relative Index: 2.4 (ref 0.0–2.5)
Total CK: 138 U/L (ref 7–232)
Total CK: 142 U/L (ref 7–232)
Troponin I: <0.01 ng/mL (ref 0.00–0.06)
Troponin I: <0.01 ng/mL (ref 0.00–0.06)

## 2010-04-26 LAB — BASIC METABOLIC PANEL
BUN: 19 mg/dL (ref 6–23)
CO2: 25 mEq/L (ref 19–32)
Calcium: 8.1 mg/dL — ABNORMAL LOW (ref 8.4–10.5)
Chloride: 104 mEq/L (ref 96–112)
Creatinine, Ser: 1.05 mg/dL (ref 0.4–1.5)
GFR calc Af Amer: >60 mL/min (ref >60)
GFR calc non Af Amer: >60 mL/min (ref >60)
Glucose, Bld: 164 mg/dL — ABNORMAL HIGH (ref 70–99)
Potassium: 3.9 mEq/L (ref 3.5–5.1)
Sodium: 136 mEq/L (ref 135–145)

## 2010-04-26 LAB — TROPONIN I: Troponin I: <0.01 ng/mL (ref 0.00–0.06)

## 2010-04-26 LAB — TSH: TSH: 0.602 u[IU]/mL (ref 0.350–4.500)

## 2010-04-26 LAB — APTT: aPTT: 25 seconds (ref 24–37)

## 2010-05-24 NOTE — Procedures (Signed)
EEG NUMBER:  [id number].   ORDERED BY:  Dr. Lynelle Doctor.   This is a 70 year old man with episodes of blurred vision and a history  of recent TIA.  He did not bring his medicines with him.  Patient was  described clinically as being awake and alert.  Electrographically, he  was also awake and alert.  This is a routine 17 channel EEG with one  channel devoted to EKG, utilizing International 10/20 lead placement  system.  Background consisted of a well-organized well-developed, well  modulated 10 Hz alpha activity which is predominant to the posterior  head regions and reactive to eye opening.  No interhemispheric asymmetry  is identified and no definite epileptiform discharges are seen.  Photic  stimulation produced occipital photic driving at several flash  frequencies near in the patient's native range.  Hyperventilation did  not produce significant change in the background activity.  EKG monitor  reveals relatively regular rhythm with a rate of 72 beats per minute.   CONCLUSION:  Normal EEG in the waking state without seizure activity or  focal abnormality seen during the course today's recording.  Clinical  correlation is recommended.      Catherine A. Orlin Hilding, M.D.  Electronically Signed     ZOX:WRUE  D:  08/01/2006 22:55:56  T:  08/02/2006 12:00:13  Job #:  [id number]

## 2010-05-27 NOTE — Discharge Summary (Signed)
NAME:  [name], [name] NO.:  [id number]   MEDICAL RECORD NO.:  [id number]          PATIENT TYPE:  OBV   LOCATION:  4707                         FACILITY:  [facility]   PHYSICIAN:  Corky Crafts, MDDATE OF BIRTH:  December 05, 1940   DATE OF ADMISSION:  11/22/2006  DATE OF DISCHARGE:  11/23/2006                               DISCHARGE SUMMARY   DISCHARGE DIAGNOSES:  1. Chest pain.  2. Syncope following administration of sublingual nitroglycerin.  3. Diabetes mellitus.  4. Dyslipidemia.  5. Long-term medication use.  6. Benign prostatic hypertrophy.   HOSPITAL COURSE:  Steve Culp is a 70 year old male patient who was  admitted to the hospital on November 22, 2006, with chest pain.  Point  of care enzymes x2 were negative.  CK-MB/troponin were negative x1.  EKG  showed normal sinus rhythm, rate 55, with no acute ST-T wave changes.   Other lab studies show normal LFTs, hemoglobin A1c 7.1, white count 6.7,  hemoglobin 12.8, hematocrit 38, platelets 202, sodium 136, potassium  4.4, BUN 16, creatinine 1.13, TSH 0.792.   Apparently, the patient did have a syncopal episode after administration  of sublingual nitroglycerin.  At least, that is what the records  indicate.   The patient wanted to go home.  He wanted the Cardiolite performed as an  outpatient.  Therefore, on hospital day #1, he was discharged to home.   DISCHARGE MEDICATIONS:  1. Aspirin 81 mg a day.  2. Actos 1 tablet daily as prior to admission.  3. Darvocet-N 100 one tablet p.o. p.r.n.  4. Flomax daily as prior to admission.  5. Lipitor daily as prior to admission.  6. Starlix as prior to admission.  7. Plavix 75 mg a day.  8. Avodart daily.  9. Janumet daily.  10.Lyrica daily.  11.Sublingual nitroglycerin p.r.n. chest pain.  The patient essentially has no change in his medications.   We did make him a follow-up appointment for Friday, November 30, 2006,  at 10:00 a.m. at Dr. Hoyle Barr  office for chemical stress test.  He is  not to eat after midnight, and we will mail further information to the  patient regarding this study.      Guy Franco, P.A.      Corky Crafts, MD  Electronically Signed    LB/MEDQ  D:  01/04/2007  T:  01/04/2007  Job:  [id number]

## 2010-06-09 ENCOUNTER — Observation Stay (HOSPITAL_COMMUNITY)
Admission: EM | Admit: 2010-06-09 | Discharge: 2010-06-10 | Disposition: A | Discharge status: Home / Self Care | Payer: Medicare Other | Attending: Interventional Cardiology | Admitting: Interventional Cardiology

## 2010-06-09 DIAGNOSIS — R5381 Other malaise: Secondary | ICD-10-CM | POA: Insufficient documentation

## 2010-06-09 DIAGNOSIS — Z7982 Long term (current) use of aspirin: Secondary | ICD-10-CM | POA: Insufficient documentation

## 2010-06-09 DIAGNOSIS — G894 Chronic pain syndrome: Secondary | ICD-10-CM | POA: Insufficient documentation

## 2010-06-09 DIAGNOSIS — R0789 Other chest pain: Secondary | ICD-10-CM | POA: Insufficient documentation

## 2010-06-09 DIAGNOSIS — M129 Arthropathy, unspecified: Secondary | ICD-10-CM | POA: Insufficient documentation

## 2010-06-09 DIAGNOSIS — Z79899 Other long term (current) drug therapy: Secondary | ICD-10-CM | POA: Insufficient documentation

## 2010-06-09 DIAGNOSIS — R0989 Other specified symptoms and signs involving the circulatory and respiratory systems: Secondary | ICD-10-CM | POA: Insufficient documentation

## 2010-06-09 DIAGNOSIS — Z8673 Personal history of transient ischemic attack (TIA), and cerebral infarction without residual deficits: Secondary | ICD-10-CM | POA: Insufficient documentation

## 2010-06-09 DIAGNOSIS — R0602 Shortness of breath: Secondary | ICD-10-CM | POA: Insufficient documentation

## 2010-06-09 DIAGNOSIS — E119 Type 2 diabetes mellitus without complications: Secondary | ICD-10-CM | POA: Insufficient documentation

## 2010-06-09 DIAGNOSIS — I2510 Atherosclerotic heart disease of native coronary artery without angina pectoris: Principal | ICD-10-CM | POA: Insufficient documentation

## 2010-06-09 DIAGNOSIS — R5383 Other fatigue: Secondary | ICD-10-CM | POA: Insufficient documentation

## 2010-06-09 DIAGNOSIS — Z7902 Long term (current) use of antithrombotics/antiplatelets: Secondary | ICD-10-CM | POA: Insufficient documentation

## 2010-06-09 DIAGNOSIS — R0609 Other forms of dyspnea: Secondary | ICD-10-CM | POA: Insufficient documentation

## 2010-06-09 DIAGNOSIS — N400 Enlarged prostate without lower urinary tract symptoms: Secondary | ICD-10-CM | POA: Insufficient documentation

## 2010-06-09 LAB — GLUCOSE, CAPILLARY: Glucose-Capillary: 218 mg/dL — ABNORMAL HIGH (ref 70–99)

## 2010-06-09 LAB — CBC
HCT: 40.1 % (ref 39.0–52.0)
Hemoglobin: 14.2 g/dL (ref 13.0–17.0)
MCH: 32.5 pg (ref 26.0–34.0)
MCHC: 35.4 g/dL (ref 30.0–36.0)
MCV: 91.8 fL (ref 78.0–100.0)
Platelets: 211 10*3/uL (ref 150–400)
RBC: 4.37 MIL/uL (ref 4.22–5.81)
RDW: 13 % (ref 11.5–15.5)
WBC: 6.1 10*3/uL (ref 4.0–10.5)

## 2010-06-09 LAB — BASIC METABOLIC PANEL
BUN: 16 mg/dL (ref 6–23)
CO2: 31 mEq/L (ref 19–32)
Calcium: 9.4 mg/dL (ref 8.4–10.5)
Chloride: 97 mEq/L (ref 96–112)
Creatinine, Ser: 1.09 mg/dL (ref 0.4–1.5)
GFR calc Af Amer: >60 mL/min (ref >60)
GFR calc non Af Amer: >60 mL/min (ref >60)
Glucose, Bld: 214 mg/dL — ABNORMAL HIGH (ref 70–99)
Potassium: 4.6 mEq/L (ref 3.5–5.1)
Sodium: 134 mEq/L — ABNORMAL LOW (ref 135–145)

## 2010-06-09 LAB — PROTIME-INR
INR: 0.96 (ref 0.00–1.49)
Prothrombin Time: 13 seconds (ref 11.6–15.2)

## 2010-06-09 LAB — PRO B NATRIURETIC PEPTIDE: Pro B Natriuretic peptide (BNP): 19.3 pg/mL (ref 0–125)

## 2010-06-10 LAB — GLUCOSE, CAPILLARY
Glucose-Capillary: 140 mg/dL — ABNORMAL HIGH (ref 70–99)
Glucose-Capillary: 101 mg/dL — ABNORMAL HIGH (ref 70–99)
Glucose-Capillary: 184 mg/dL — ABNORMAL HIGH (ref 70–99)
Glucose-Capillary: 177 mg/dL — ABNORMAL HIGH (ref 70–99)

## 2010-06-10 LAB — TSH: TSH: 0.96 u[IU]/mL (ref 0.350–4.500)

## 2010-06-16 NOTE — Cardiovascular Report (Signed)
NAME:  Steve Brown, Steve Brown NO.:  [id number]  MEDICAL RECORD NO.:  [id number]           PATIENT TYPE:  O  LOCATION:  4741                         FACILITY:  [facility]  PHYSICIAN:  Corky Crafts, MDDATE OF BIRTH:  1940/12/31  DATE OF PROCEDURE:  06/10/2010 DATE OF DISCHARGE:  06/10/2010                           CARDIAC CATHETERIZATION   PROCEDURES PERFORMED: 1. Left heart catheterization. 2. Left ventriculogram. 3. Coronary angiogram. 4. Abdominal aortogram.  OPERATOR:  Corky Crafts, MD  INDICATIONS:  Unstable angina, coronary artery disease.  PROCEDURE NOTE:  The risks and benefits of cardiac catheterization was explained to the patient.  Informed consent was obtained.  He was brought to the cath lab.  He was prepped and draped in usual sterile fashion.  His right groin was infiltrated with 1% lidocaine.  A 6-French sheath was placed into the right common femoral artery using modified Seldinger technique.  Left coronary artery angiography was performed using a JL-4.0 catheter.  The catheter was advanced to the vessel ostium under fluoroscopic guidance.  Digital angiography was performed in multiple projections using hand injection of contrast.  Right coronary artery angiography was then performed using a JR-4.0 catheter in a similar fashion.  A pigtail catheter was advanced in the ascending aorta and across the aortic valve under fluoroscopic guidance.  Power injection of contrast was performed in the RAO projection to image the left ventricle.  Catheter was pulled back under continuous hemodynamic pressure monitoring.  Catheter was withdrawn to the abdominal aorta and power injection of contrast was performed in the AP projection to image the infrarenal abdominal aorta.  An Angio-Seal was deployed for hemostasis.  FINDINGS:  The LAD and left circumflex have separate ostia. The left anterior descending has mild irregularities proximally.  In  the mid vessel, there is a 40-50% mid lesion at the origin of the septal prior to the previous stent.  The old stent is widely patent.  The distal LAD is a medium-to-small sized vessel. The left circumflex is a medium-sized vessel.  The OM-1 and OM-2 are widely patent. The right coronary artery is a large dominant vessel.  There are mild irregularities.  The posterior descending is a large vessel. Posterolateral artery is a small vessel, is widely patent. The left ventriculogram showed normal left ventricular function with the left ventricular ejection fraction of 55%. Abdominal aortogram showed no abdominal aortic aneurysm with bilateral single renal arteries, both of which are widely patent.  Aortoiliac bifurcation is widely patent.  HEMODYNAMICS:  Left ventricular pressure 141/7 with an LVEDP of 13 mmHg, aortic pressure 144/76 with a mean aortic pressure of 105 mmHg.  IMPRESSION: 1. Widely patent left anterior descending coronary artery stent, mild-     to-moderate mid left anterior descending coronary artery disease. 2. Normal left ventricular function. 3. No abdominal aortic aneurysm or renal artery stenosis. 4. Normal hemodynamics.  RECOMMENDATIONS:  Continue aggressive medical therapy.  The patient will likely be able to be discharged later today.     Corky Crafts, MD     JSV/MEDQ  D:  06/10/2010  T:  06/10/2010  Job:  [id number]  Electronically Signed  by Lance Muss MD on 06/16/2010 12:27:00 PM

## 2010-06-16 NOTE — Discharge Summary (Signed)
  NAME:  AVIS, MCMAHILL NO.:  [id number]  MEDICAL RECORD NO.:  [id number]           PATIENT TYPE:  O  LOCATION:  4741                         FACILITY:  [facility]  PHYSICIAN:  Corky Crafts, MDDATE OF BIRTH:  August 04, 1940  DATE OF ADMISSION:  06/09/2010 DATE OF DISCHARGE:  06/10/2010                              DISCHARGE SUMMARY   PRIMARY CARE PHYSICIAN:  Hessie Diener (C.Alan) Tenny Craw, MD  FINAL DIAGNOSES: 1. Coronary artery disease. 2. Noncardiac chest pain. 3. Diabetes.  PROCEDURES PERFORMED:  Cardiac catheterization showing on June 10, 2010, showing only mild-to-moderate mid LAD disease, prior mid LAD stent is widely patent.  No hemodynamically significant coronary artery disease.  HOSPITAL COURSE:  The patient was admitted after being seen in our office and complaining of heaviness in his chest and numbness down his arms.  He underwent cardiac catheterization showing the above findings. He tolerated the procedure well.  He had an Angio-Seal in his right groin and had no bleeding issues.  His BNP was normal.  His vital signs are stable in the hospital.  He had no further chest discomfort or shortness of breath.  DISCHARGE MEDICATIONS: 1. Aspirin 325 mg daily. 2. Avodart 0.5 mg daily. 3. Cymbalta 60 mg daily. 4. Janumet 50/1000 one tablet b.i.d., he is to restart this medicine     on June 12, 2010. 5. Lasix 20 mg p.o. daily p.r.n. swelling. 6. Levemir 8 units subcutaneous nightly. 7. Lyrica 1 capsule 300 mg b.i.d. 8. Sublingual nitroglycerin p.r.n. 9. Plavix 75 mg daily. 10.Prandin one tablet t.i.d. 11.Ramipril 2.5 mg daily. 12.Tramadol 50 mg p.r.n. pain. 13.Vitamin D. 14.Voltaren gel.  ACTIVITY:  Increase activity slowly.  No lifting more than 10 pounds for about a week.  DIET:  Low-sodium heart-healthy diet along with the diabetic diet.  FOLLOWUP:  With Dr. Eldridge Dace in about 4 weeks.  He will also follow up with Dr. Tenny Craw, his primary care  doctor.  SPECIAL INSTRUCTIONS:  Hold Janumet until June 12, 2010.     Corky Crafts, MD     JSV/MEDQ  D:  06/10/2010  T:  06/11/2010  Job:  [id number]  Electronically Signed by Lance Muss MD on 06/16/2010 12:27:06 PM

## 2010-07-14 NOTE — H&P (Signed)
NAME:  Steve Brown, Steve Brown NO.:  [id number]  MEDICAL RECORD NO.:  [id number]           PATIENT TYPE:  O  LOCATION:  4741                         FACILITY:  [facility]  PHYSICIAN:  Lyn Records, M.D.   DATE OF BIRTH:  05/20/1940  DATE OF ADMISSION:  06/09/2010 DATE OF DISCHARGE:                             HISTORY & PHYSICAL   REASON FOR ADMISSION:  Sent to the emergency room by [facility].  SUBJECTIVE:  The patient is 70 years of age and has a 2-week history of fatigue and dyspnea.  A cardiac catheterization was planned for approximately 1 week from now.  The patient came to the office this morning complaining of arm tingling.  An electrocardiogram was performed and the patient was asked to come to the emergency room for admission. He has been sitting in the triage area for several hours.  He is totally asymptomatic.  He has not had chest pain.  There was a prior history of LAD stent in 2010.  The patient is diabetic.  He is perturbed that he has been in the emergency room for so long today.  SIGNIFICANT MEDICAL PROBLEMS: 1. Diabetes. 2. Coronary artery disease with LAD stent 2010. 3. Chronic pain syndrome care for by Dr. Porfirio Mylar Dohmeier. 4. History of left hand unspecified neuropathy.  FAMILY HISTORY:  Both mother and father are deceased.  Neither had coronary disease.  Mother died of a stroke.  One of 7 brothers committed suicide at age 9.  One sister died of stroke and diabetes complications at age 39.  SOCIAL HISTORY:  Does not smoke.  Denies ethanol consumption. Unemployed retired divorced father of 4.  ALLERGIES:  PHENERGAN.  MEDICATIONS: 1. Lyrica 300 mg b.i.d. 2. Valtrex 1 g daily as directed. 3. Avodart 0.5 mg tablets p.o. daily. 4. Voltaren gel 1% as directed. 5. Centrum Silver multiple vitamin one per day. 6. Vitamin D3 5000 units/mL 1 capsule daily. 7. Janumet 50/1000 mg 1 tablet b.i.d. 8. Aspirin 325 mg daily. 9. NitroQuick 0.4 mg  sublingually p.r.n. 10.Plavix 75 mg per day. 11.Cymbalta 30 mg daily. 12.Metanx 3-35-2 mg tablets 1 tablet twice a day. 13.Meloxicam 7.5 mg 1-2 tablets p.r.n. pain. 14.Prandin 1 mg 15-30 minutes before meals three times daily. 15.Levemir FlexPen 100 units/mL 8 units subcu daily. 16.Furosemide 20 mg daily. 17.Tramadol HCl 50 mg every 6 hours. 18.Ramipril 2.5 mg daily.  REVIEW OF SYSTEMS:  Otherwise, unremarkable.  OBJECTIVE:  GENERAL:  The patient is lying in the gurney room 31 no acute distress watching television somewhat perturbed. VITAL SIGNS:  Blood pressure 138/70, heart rate 70. SKIN:  Warm and dry. HEENT:  Unremarkable. NECK:  No JVD or carotid bruits. LUNGS:  Clear. CARDIAC:  Soft 1/6 systolic murmur, otherwise unremarkable. ABDOMEN:  Obese, nontender.  No obvious organomegaly is noted. EXTREMITIES:  No edema.  Pulses are 2+ and symmetric in the radials and posterior tibialis bilaterally. NEURO:  No significant central deficits.  Cognitive function appears normal.  LABORATORY DATA:  No laboratory data is available.  IMAGING:  EKG from the office and EKG here in the emergency room is normal.  ASSESSMENT: 1. Nonspecific  complaints of fatigue and dyspnea of uncertain cause,     not likely to be cardiac. 2. History of coronary artery disease with prior stent 2010. 3. Diabetes.  PLAN: 1. N.p.o. after midnight for possible cath by Dr. Eldridge Dace in a.m. 2. Continue medications with the exception of Janumet. 3. We will feed the patient this evening. 4. We will check a TSH level.     Lyn Records, M.D.     HWS/MEDQ  D:  06/09/2010  T:  06/10/2010  Job:  [id number]  Electronically Signed by Verdis Prime M.D. on 07/14/2010 01:35:31 PM

## 2010-10-05 LAB — PROTIME-INR
INR: 1
Prothrombin Time: 13.1

## 2010-10-05 LAB — POCT I-STAT, CHEM 8
BUN: 22
Calcium, Ion: 1.19
Chloride: 104
Creatinine, Ser: 1.4
Glucose, Bld: 106 — ABNORMAL HIGH
HCT: 39
Hemoglobin: 13.3
Potassium: 4.7
Sodium: 138
TCO2: 28

## 2010-10-18 LAB — DIFFERENTIAL
Basophils Absolute: 0
Basophils Relative: 0
Eosinophils Absolute: 0.1 — ABNORMAL LOW
Eosinophils Relative: 1
Lymphocytes Relative: 27
Lymphs Abs: 1.8
Monocytes Absolute: 0.5
Monocytes Relative: 8
Neutro Abs: 4.3
Neutrophils Relative %: 64

## 2010-10-18 LAB — I-STAT 8, (EC8 V) (CONVERTED LAB)
Acid-Base Excess: 1
BUN: 17
Bicarbonate: 28.5 — ABNORMAL HIGH
Chloride: 106
Glucose, Bld: 105 — ABNORMAL HIGH
HCT: 41
Hemoglobin: 13.9
Operator id: 270111
Potassium: 4.8
Sodium: 139
TCO2: 30
pCO2, Ven: 59.6 — ABNORMAL HIGH
pH, Ven: 7.288

## 2010-10-18 LAB — POCT CARDIAC MARKERS
CKMB, poc: 3
CKMB, poc: 3.7
Myoglobin, poc: 84.4
Myoglobin, poc: 95.2
Operator id: 270111
Operator id: 270111
Troponin i, poc: <0.05
Troponin i, poc: <0.05

## 2010-10-18 LAB — COMPREHENSIVE METABOLIC PANEL
ALT: 40
AST: 31
Albumin: 3.7
Alkaline Phosphatase: 65
BUN: 16
CO2: 29
Calcium: 8.9
Chloride: 102
Creatinine, Ser: 1.13
GFR calc Af Amer: >60
GFR calc non Af Amer: >60
Glucose, Bld: 92
Potassium: 4.4
Sodium: 136
Total Bilirubin: 0.5
Total Protein: 6.6

## 2010-10-18 LAB — CBC
HCT: 38 — ABNORMAL LOW
Hemoglobin: 12.8 — ABNORMAL LOW
MCHC: 33.6
MCV: 98.2
Platelets: 202
RBC: 3.87 — ABNORMAL LOW
RDW: 14
WBC: 6.7

## 2010-10-18 LAB — APTT: aPTT: 27

## 2010-10-18 LAB — PROTIME-INR
INR: 1
Prothrombin Time: 13.3

## 2010-10-18 LAB — HEMOGLOBIN A1C
Hgb A1c MFr Bld: 7.1 — ABNORMAL HIGH
Mean Plasma Glucose: 175

## 2010-10-18 LAB — CK TOTAL AND CKMB (NOT AT ARMC)
CK, MB: 6.1 — ABNORMAL HIGH
Relative Index: 1.6
Total CK: 387 — ABNORMAL HIGH

## 2010-10-18 LAB — TROPONIN I: Troponin I: 0.01

## 2010-10-18 LAB — POCT I-STAT CREATININE
Creatinine, Ser: 1.2
Operator id: 270111

## 2010-10-18 LAB — TSH: TSH: 0.792

## 2010-10-25 LAB — URINALYSIS, ROUTINE W REFLEX MICROSCOPIC
Bilirubin Urine: NEGATIVE
Glucose, UA: >1000 — AB
Hgb urine dipstick: NEGATIVE
Ketones, ur: 15 — AB
Nitrite: NEGATIVE
Protein, ur: NEGATIVE
Specific Gravity, Urine: 1.037 — ABNORMAL HIGH
Urobilinogen, UA: 0.2
pH: 6.5

## 2010-10-25 LAB — CBC
HCT: 35.4 — ABNORMAL LOW
Hemoglobin: 12 — ABNORMAL LOW
MCHC: 33.8
MCV: 96.1
Platelets: 167
RBC: 3.69 — ABNORMAL LOW
RDW: 13.6
WBC: 7.1

## 2010-10-25 LAB — POCT I-STAT CREATININE
Creatinine, Ser: 1.3
Operator id: 146091

## 2010-10-25 LAB — APTT: aPTT: 20 — ABNORMAL LOW

## 2010-10-25 LAB — DIFFERENTIAL
Basophils Absolute: 0.1
Basophils Relative: 1
Eosinophils Absolute: 0.1
Eosinophils Relative: 1
Lymphocytes Relative: 19
Lymphs Abs: 1.3
Monocytes Absolute: 0.5
Monocytes Relative: 7
Neutro Abs: 5.1
Neutrophils Relative %: 72

## 2010-10-25 LAB — POCT CARDIAC MARKERS
CKMB, poc: 2.7
Myoglobin, poc: 59.6
Operator id: 146091
Troponin i, poc: <0.05

## 2010-10-25 LAB — PROTIME-INR
INR: 1
Prothrombin Time: 12.9

## 2010-10-25 LAB — URINE MICROSCOPIC-ADD ON

## 2010-10-25 LAB — I-STAT 8, (EC8 V) (CONVERTED LAB)
Acid-Base Excess: 3 — ABNORMAL HIGH
BUN: 15
Bicarbonate: 29.7 — ABNORMAL HIGH
Chloride: 97
Glucose, Bld: 484 — ABNORMAL HIGH
HCT: 40
Hemoglobin: 13.6
Operator id: 146091
Potassium: 5.1
Sodium: 130 — ABNORMAL LOW
TCO2: 31
pCO2, Ven: 54 — ABNORMAL HIGH
pH, Ven: 7.348 — ABNORMAL HIGH

## 2011-01-26 ENCOUNTER — Ambulatory Visit: Payer: Medicare Other

## 2011-01-26 DIAGNOSIS — Z79899 Other long term (current) drug therapy: Secondary | ICD-10-CM

## 2011-01-26 NOTE — Patient Instructions (Signed)
Original EKG given to pt, copy kept for office per policy.

## 2011-01-26 NOTE — Progress Notes (Signed)
EKG for [facility]

## 2011-02-03 ENCOUNTER — Emergency Department (HOSPITAL_COMMUNITY)
Admission: EM | Admit: 2011-02-03 | Discharge: 2011-02-03 | Disposition: A | Discharge status: Home / Self Care | Payer: Medicare Other | Attending: Emergency Medicine | Admitting: Emergency Medicine

## 2011-02-03 ENCOUNTER — Other Ambulatory Visit: Payer: Self-pay

## 2011-02-03 ENCOUNTER — Encounter (HOSPITAL_COMMUNITY): Payer: Self-pay | Admitting: Emergency Medicine

## 2011-02-03 DIAGNOSIS — E119 Type 2 diabetes mellitus without complications: Secondary | ICD-10-CM | POA: Insufficient documentation

## 2011-02-03 DIAGNOSIS — R1110 Vomiting, unspecified: Secondary | ICD-10-CM | POA: Insufficient documentation

## 2011-02-03 DIAGNOSIS — R5383 Other fatigue: Secondary | ICD-10-CM | POA: Diagnosis not present

## 2011-02-03 DIAGNOSIS — R42 Dizziness and giddiness: Secondary | ICD-10-CM | POA: Diagnosis not present

## 2011-02-03 DIAGNOSIS — R5381 Other malaise: Secondary | ICD-10-CM | POA: Insufficient documentation

## 2011-02-03 DIAGNOSIS — I2510 Atherosclerotic heart disease of native coronary artery without angina pectoris: Secondary | ICD-10-CM | POA: Insufficient documentation

## 2011-02-03 DIAGNOSIS — R55 Syncope and collapse: Secondary | ICD-10-CM | POA: Insufficient documentation

## 2011-02-03 DIAGNOSIS — I10 Essential (primary) hypertension: Secondary | ICD-10-CM | POA: Insufficient documentation

## 2011-02-03 DIAGNOSIS — R112 Nausea with vomiting, unspecified: Secondary | ICD-10-CM | POA: Diagnosis not present

## 2011-02-03 DIAGNOSIS — R404 Transient alteration of awareness: Secondary | ICD-10-CM | POA: Diagnosis not present

## 2011-02-03 HISTORY — DX: Essential (primary) hypertension: I10

## 2011-02-03 HISTORY — DX: Atherosclerotic heart disease of native coronary artery without angina pectoris: I25.10

## 2011-02-03 LAB — CBC
HCT: 36.6 % — ABNORMAL LOW (ref 39.0–52.0)
Hemoglobin: 12.8 g/dL — ABNORMAL LOW (ref 13.0–17.0)
MCH: 32.1 pg (ref 26.0–34.0)
MCHC: 35 g/dL (ref 30.0–36.0)
MCV: 91.7 fL (ref 78.0–100.0)
Platelets: 171 10*3/uL (ref 150–400)
RBC: 3.99 MIL/uL — ABNORMAL LOW (ref 4.22–5.81)
RDW: 13.2 % (ref 11.5–15.5)
WBC: 9 10*3/uL (ref 4.0–10.5)

## 2011-02-03 LAB — POCT I-STAT, CHEM 8
BUN: 17 mg/dL (ref 6–23)
Calcium, Ion: 1.1 mmol/L — ABNORMAL LOW (ref 1.12–1.32)
Chloride: 102 mEq/L (ref 96–112)
Creatinine, Ser: 1 mg/dL (ref 0.50–1.35)
Glucose, Bld: 190 mg/dL — ABNORMAL HIGH (ref 70–99)
HCT: 43 % (ref 39.0–52.0)
Hemoglobin: 14.6 g/dL (ref 13.0–17.0)
Potassium: 4.5 mEq/L (ref 3.5–5.1)
Sodium: 140 mEq/L (ref 135–145)
TCO2: 27 mmol/L (ref 0–100)

## 2011-02-03 LAB — URINALYSIS, ROUTINE W REFLEX MICROSCOPIC
Bilirubin Urine: NEGATIVE
Glucose, UA: NEGATIVE mg/dL
Hgb urine dipstick: NEGATIVE
Ketones, ur: 40 mg/dL — AB
Nitrite: NEGATIVE
Protein, ur: NEGATIVE mg/dL
Specific Gravity, Urine: 1.018 (ref 1.005–1.030)
Urobilinogen, UA: 1 mg/dL (ref 0.0–1.0)
pH: 6.5 (ref 5.0–8.0)

## 2011-02-03 LAB — DIFFERENTIAL
Basophils Absolute: 0 10*3/uL (ref 0.0–0.1)
Basophils Relative: 0 % (ref 0–1)
Eosinophils Absolute: 0.1 10*3/uL (ref 0.0–0.7)
Eosinophils Relative: 1 % (ref 0–5)
Lymphocytes Relative: 5 % — ABNORMAL LOW (ref 12–46)
Lymphs Abs: 0.4 10*3/uL — ABNORMAL LOW (ref 0.7–4.0)
Monocytes Absolute: 0.5 10*3/uL (ref 0.1–1.0)
Monocytes Relative: 5 % (ref 3–12)
Neutro Abs: 8.1 10*3/uL — ABNORMAL HIGH (ref 1.7–7.7)
Neutrophils Relative %: 90 % — ABNORMAL HIGH (ref 43–77)

## 2011-02-03 LAB — URINE MICROSCOPIC-ADD ON

## 2011-02-03 MED ORDER — ONDANSETRON 8 MG PO TBDP: ORAL_TABLET | ORAL | Status: AC

## 2011-02-03 MED ORDER — ONDANSETRON HCL 4 MG/2ML IJ SOLN
4.0000 mg | Freq: Once | INTRAMUSCULAR | Status: AC
  Administered 2011-02-03: 4 mg via INTRAVENOUS

## 2011-02-03 MED ORDER — ONDANSETRON 8 MG PO TBDP: ORAL_TABLET | ORAL | Status: DC

## 2011-02-03 MED ORDER — SODIUM CHLORIDE 0.9 % IV BOLUS (SEPSIS)
1000.0000 mL | Freq: Once | INTRAVENOUS | Status: AC
  Administered 2011-02-03: 1000 mL via INTRAVENOUS

## 2011-02-03 MED ORDER — ONDANSETRON HCL 4 MG/2ML IJ SOLN
INTRAMUSCULAR | Status: AC
  Filled 2011-02-03: qty 2

## 2011-02-03 NOTE — ED Provider Notes (Signed)
History     CSN: [id number]  Arrival date & time 02/03/11  0254   First MD Initiated Contact with Patient 02/03/11 0300      Chief Complaint  Patient presents with  . Emesis    HPI  History provided by the patient. Patient is a 71 year old male with history of diabetes, hypertension, coranary artery disease who presents with complaints of nausea and vomiting that began last evening. Patient reports having some feelings of increased fatigue and general illness earlier in the evening. Patient went to a friend's house for dinner and shortly after eating started to feel worsening symptoms of nausea. He began to have 3 episodes of vomiting. It also reports having associated lightheadedness with syncopal episode after standing up quickly. Pt does not report any aggravating or alleviating factors. Patient does not recall having any known sick contacts. Patient states he felt fine in the morning and had a good workout at the [employer] [place]. He states that he feels he may just be a little dehydrated. He has tried to drink water since feeling poorly. Patient denies having any fever, chills, chest pain, heart palpitations, shortness of breath. Patient denies any URI symptoms, no nasal congestion, sore throat, cough.  He denies any dysuria, hematuria, urinary frequency, abdominal pain, diarrhea or constipation.    Past Medical History  Diagnosis Date  . Diabetes mellitus   . Coronary artery disease   . Hypertension     No past surgical history on file.  No family history on file.  History  Substance Use Topics  . Smoking status: Not on file  . Smokeless tobacco: Not on file  . Alcohol Use:       Review of Systems  Constitutional: Negative for fever, chills and appetite change.  HENT: Negative for congestion, sore throat and rhinorrhea.   Respiratory: Negative for cough and shortness of breath.   Cardiovascular: Negative for chest pain and palpitations.  Gastrointestinal: Positive for nausea and  vomiting. Negative for abdominal pain, diarrhea, constipation and blood in stool.  Genitourinary: Negative for dysuria, frequency and hematuria.  Neurological: Positive for light-headedness.  All other systems reviewed and are negative.    Allergies  Review of patient's allergies indicates no known allergies.  Home Medications   Current Outpatient Rx  Name Route Sig Dispense Refill  . VITAMIN D 1000 UNITS PO TABS Oral Take 1,000 Units by mouth daily.      BP 126/64  Pulse 71  Temp 98 F (36.7 C)  Resp 18  SpO2 95%  Physical Exam  Nursing note and vitals reviewed. Constitutional: He is oriented to person, place, and time. He appears well-developed and well-nourished.  HENT:  Head: Normocephalic and atraumatic.  Mouth/Throat: Oropharynx is clear and moist.  Eyes: Conjunctivae and EOM are normal. Pupils are equal, round, and reactive to light.  Neck: Normal range of motion. Neck supple.  Cardiovascular: Normal rate and regular rhythm.   Pulmonary/Chest: Effort normal and breath sounds normal. No respiratory distress. He has no wheezes. He has no rales.  Abdominal: Soft. Bowel sounds are normal. He exhibits no distension. There is no tenderness. There is no rebound and no guarding.  Neurological: He is alert and oriented to person, place, and time.  Skin: Skin is warm. No rash noted.  Psychiatric: His behavior is normal.    ED Course  Procedures    Labs Reviewed  CBC  DIFFERENTIAL  I-STAT, CHEM 8  URINALYSIS, ROUTINE W REFLEX MICROSCOPIC   Results for orders placed  during the hospital encounter of 02/03/11  CBC      Component Value Range   WBC 9.0  4.0 - 10.5 (K/uL)   RBC 3.99 (*) 4.22 - 5.81 (MIL/uL)   Hemoglobin 12.8 (*) 13.0 - 17.0 (g/dL)   HCT 96.0 (*) 45.4 - 52.0 (%)   MCV 91.7  78.0 - 100.0 (fL)   MCH 32.1  26.0 - 34.0 (pg)   MCHC 35.0  30.0 - 36.0 (g/dL)   RDW 09.8  11.9 - 14.7 (%)   Platelets 171  150 - 400 (K/uL)  DIFFERENTIAL      Component Value  Range   Neutrophils Relative 90 (*) 43 - 77 (%)   Neutro Abs 8.1 (*) 1.7 - 7.7 (K/uL)   Lymphocytes Relative 5 (*) 12 - 46 (%)   Lymphs Abs 0.4 (*) 0.7 - 4.0 (K/uL)   Monocytes Relative 5  3 - 12 (%)   Monocytes Absolute 0.5  0.1 - 1.0 (K/uL)   Eosinophils Relative 1  0 - 5 (%)   Eosinophils Absolute 0.1  0.0 - 0.7 (K/uL)   Basophils Relative 0  0 - 1 (%)   Basophils Absolute 0.0  0.0 - 0.1 (K/uL)  URINALYSIS, ROUTINE W REFLEX MICROSCOPIC      Component Value Range   Color, Urine YELLOW  YELLOW    APPearance CLEAR  CLEAR    Specific Gravity, Urine 1.018  1.005 - 1.030    pH 6.5  5.0 - 8.0    Glucose, UA NEGATIVE  NEGATIVE (mg/dL)   Hgb urine dipstick NEGATIVE  NEGATIVE    Bilirubin Urine NEGATIVE  NEGATIVE    Ketones, ur 40 (*) NEGATIVE (mg/dL)   Protein, ur NEGATIVE  NEGATIVE (mg/dL)   Urobilinogen, UA 1.0  0.0 - 1.0 (mg/dL)   Nitrite NEGATIVE  NEGATIVE    Leukocytes, UA TRACE (*) NEGATIVE   URINE MICROSCOPIC-ADD ON      Component Value Range   Squamous Epithelial / LPF RARE  RARE    WBC, UA 0-2  <3 (WBC/hpf)   Bacteria, UA RARE  RARE        1. Nausea & vomiting       MDM  3:00a.m. Patient seen and evaluated. patient in no acute distress. Patient non-toxic-appearing   Pt seen and discussed with Attending Physician.  ECG unremarkable and charted by Attending. Attending agrees with work up and tx plan.   Pt reports feeling much better after Zofran. Patient tolerating by mouth fluids.     Angus Seller, PA 02/03/11 Jeralyn Bennett

## 2011-02-03 NOTE — ED Notes (Signed)
PT has been feeling nauseated and vomited x 3. Pt reported a syncopal episode worsening upon sitting up. No pain or other complaints.

## 2011-02-03 NOTE — ED Notes (Signed)
CBG 144 per EMS.

## 2011-02-03 NOTE — ED Notes (Signed)
Patient is resting comfortably. 

## 2011-02-03 NOTE — ED Provider Notes (Signed)
3:35 AM  I performed a history and physical examination of Steve Brown and discussed his management with Fabio Pierce.  I agree with the history, physical, assessment, and plan of care, with the following exceptions: None  The patient is awake, alert, and oriented to person, place, time, and event, and in no apparent distress. He is sitting upright on the edge of the bed. His lung sounds are clear in all fields without rales, rhonchi, or wheezes. His heart sounds are normal, without murmur, rub, or gallop, and are regular in rate and rhythm.   Date: 02/03/2011  Rate: 68  Rhythm: normal sinus rhythm  QRS Axis: left  Intervals: normal  ST/T Wave abnormalities: normal  Conduction Disutrbances:none  Narrative Interpretation: Non-provocative EKG  Old EKG Reviewed: No significant changes    I was present for the following procedures: None Time Spent in Critical Care of the patient: None Time spent in discussions with the patient and family: 5 minutes  Manus Rudd, MD 02/03/11 [phone]

## 2011-02-06 DIAGNOSIS — D649 Anemia, unspecified: Secondary | ICD-10-CM | POA: Diagnosis not present

## 2011-02-06 DIAGNOSIS — E119 Type 2 diabetes mellitus without complications: Secondary | ICD-10-CM | POA: Diagnosis not present

## 2011-02-08 NOTE — ED Provider Notes (Signed)
Evaluation and management procedures were performed by the PA/NP under my supervision/collaboration.   Aniqa Hare D Dilyn Osoria, MD 02/08/11 2004 

## 2011-02-15 DIAGNOSIS — G5790 Unspecified mononeuropathy of unspecified lower limb: Secondary | ICD-10-CM | POA: Diagnosis not present

## 2011-02-15 DIAGNOSIS — M19019 Primary osteoarthritis, unspecified shoulder: Secondary | ICD-10-CM | POA: Diagnosis not present

## 2011-02-15 DIAGNOSIS — M1990 Unspecified osteoarthritis, unspecified site: Secondary | ICD-10-CM | POA: Diagnosis not present

## 2011-02-15 DIAGNOSIS — G894 Chronic pain syndrome: Secondary | ICD-10-CM | POA: Diagnosis not present

## 2011-02-28 DIAGNOSIS — E119 Type 2 diabetes mellitus without complications: Secondary | ICD-10-CM | POA: Diagnosis not present

## 2011-03-06 DIAGNOSIS — E1059 Type 1 diabetes mellitus with other circulatory complications: Secondary | ICD-10-CM | POA: Diagnosis not present

## 2011-03-06 DIAGNOSIS — L608 Other nail disorders: Secondary | ICD-10-CM | POA: Diagnosis not present

## 2011-03-06 DIAGNOSIS — I739 Peripheral vascular disease, unspecified: Secondary | ICD-10-CM | POA: Diagnosis not present

## 2011-04-15 DIAGNOSIS — J209 Acute bronchitis, unspecified: Secondary | ICD-10-CM | POA: Diagnosis not present

## 2011-04-29 DIAGNOSIS — J019 Acute sinusitis, unspecified: Secondary | ICD-10-CM | POA: Diagnosis not present

## 2011-05-10 DIAGNOSIS — M47812 Spondylosis without myelopathy or radiculopathy, cervical region: Secondary | ICD-10-CM | POA: Diagnosis not present

## 2011-05-10 DIAGNOSIS — G894 Chronic pain syndrome: Secondary | ICD-10-CM | POA: Diagnosis not present

## 2011-05-10 DIAGNOSIS — M5030 Other cervical disc degeneration, unspecified cervical region: Secondary | ICD-10-CM | POA: Diagnosis not present

## 2011-05-10 DIAGNOSIS — M1990 Unspecified osteoarthritis, unspecified site: Secondary | ICD-10-CM | POA: Diagnosis not present

## 2011-05-10 DIAGNOSIS — Z79899 Other long term (current) drug therapy: Secondary | ICD-10-CM | POA: Diagnosis not present

## 2011-05-25 DIAGNOSIS — E1059 Type 1 diabetes mellitus with other circulatory complications: Secondary | ICD-10-CM | POA: Diagnosis not present

## 2011-05-25 DIAGNOSIS — L84 Corns and callosities: Secondary | ICD-10-CM | POA: Diagnosis not present

## 2011-05-25 DIAGNOSIS — L608 Other nail disorders: Secondary | ICD-10-CM | POA: Diagnosis not present

## 2011-05-25 DIAGNOSIS — I739 Peripheral vascular disease, unspecified: Secondary | ICD-10-CM | POA: Diagnosis not present

## 2011-06-06 DIAGNOSIS — IMO0001 Reserved for inherently not codable concepts without codable children: Secondary | ICD-10-CM | POA: Diagnosis not present

## 2011-06-06 DIAGNOSIS — B49 Unspecified mycosis: Secondary | ICD-10-CM | POA: Diagnosis not present

## 2011-06-07 DIAGNOSIS — M19019 Primary osteoarthritis, unspecified shoulder: Secondary | ICD-10-CM | POA: Diagnosis not present

## 2011-06-14 DIAGNOSIS — E119 Type 2 diabetes mellitus without complications: Secondary | ICD-10-CM | POA: Diagnosis not present

## 2011-06-14 DIAGNOSIS — M545 Low back pain, unspecified: Secondary | ICD-10-CM | POA: Diagnosis not present

## 2011-06-15 DIAGNOSIS — E1139 Type 2 diabetes mellitus with other diabetic ophthalmic complication: Secondary | ICD-10-CM | POA: Diagnosis not present

## 2011-06-21 DIAGNOSIS — M65979 Unspecified synovitis and tenosynovitis, unspecified ankle and foot: Secondary | ICD-10-CM | POA: Diagnosis not present

## 2011-06-21 DIAGNOSIS — M659 Synovitis and tenosynovitis, unspecified: Secondary | ICD-10-CM | POA: Diagnosis not present

## 2011-06-21 DIAGNOSIS — M722 Plantar fascial fibromatosis: Secondary | ICD-10-CM | POA: Diagnosis not present

## 2011-06-21 DIAGNOSIS — M7730 Calcaneal spur, unspecified foot: Secondary | ICD-10-CM | POA: Diagnosis not present

## 2011-06-21 DIAGNOSIS — M7150 Other bursitis, not elsewhere classified, unspecified site: Secondary | ICD-10-CM | POA: Diagnosis not present

## 2011-06-29 DIAGNOSIS — M7150 Other bursitis, not elsewhere classified, unspecified site: Secondary | ICD-10-CM | POA: Diagnosis not present

## 2011-06-29 DIAGNOSIS — M722 Plantar fascial fibromatosis: Secondary | ICD-10-CM | POA: Diagnosis not present

## 2011-07-04 DIAGNOSIS — N433 Hydrocele, unspecified: Secondary | ICD-10-CM | POA: Diagnosis not present

## 2011-07-04 DIAGNOSIS — N509 Disorder of male genital organs, unspecified: Secondary | ICD-10-CM | POA: Diagnosis not present

## 2011-07-04 DIAGNOSIS — N139 Obstructive and reflux uropathy, unspecified: Secondary | ICD-10-CM | POA: Diagnosis not present

## 2011-07-18 ENCOUNTER — Emergency Department (HOSPITAL_COMMUNITY): Payer: Medicare Other

## 2011-07-18 ENCOUNTER — Encounter (HOSPITAL_COMMUNITY): Payer: Self-pay | Admitting: Emergency Medicine

## 2011-07-18 ENCOUNTER — Inpatient Hospital Stay (HOSPITAL_COMMUNITY)
Admission: EM | Admit: 2011-07-18 | Discharge: 2011-07-20 | DRG: 556 | Disposition: A | Discharge status: Home / Self Care | Payer: Medicare Other | Attending: Internal Medicine | Admitting: Internal Medicine

## 2011-07-18 DIAGNOSIS — E119 Type 2 diabetes mellitus without complications: Secondary | ICD-10-CM | POA: Diagnosis not present

## 2011-07-18 DIAGNOSIS — R7301 Impaired fasting glucose: Secondary | ICD-10-CM | POA: Diagnosis not present

## 2011-07-18 DIAGNOSIS — G8190 Hemiplegia, unspecified affecting unspecified side: Secondary | ICD-10-CM | POA: Diagnosis not present

## 2011-07-18 DIAGNOSIS — I6350 Cerebral infarction due to unspecified occlusion or stenosis of unspecified cerebral artery: Secondary | ICD-10-CM | POA: Diagnosis not present

## 2011-07-18 DIAGNOSIS — R209 Unspecified disturbances of skin sensation: Secondary | ICD-10-CM | POA: Diagnosis not present

## 2011-07-18 DIAGNOSIS — I2510 Atherosclerotic heart disease of native coronary artery without angina pectoris: Secondary | ICD-10-CM | POA: Diagnosis present

## 2011-07-18 DIAGNOSIS — E118 Type 2 diabetes mellitus with unspecified complications: Secondary | ICD-10-CM | POA: Diagnosis present

## 2011-07-18 DIAGNOSIS — Z8673 Personal history of transient ischemic attack (TIA), and cerebral infarction without residual deficits: Secondary | ICD-10-CM

## 2011-07-18 DIAGNOSIS — R29898 Other symptoms and signs involving the musculoskeletal system: Principal | ICD-10-CM | POA: Diagnosis present

## 2011-07-18 DIAGNOSIS — I10 Essential (primary) hypertension: Secondary | ICD-10-CM | POA: Diagnosis present

## 2011-07-18 DIAGNOSIS — R5381 Other malaise: Secondary | ICD-10-CM | POA: Diagnosis not present

## 2011-07-18 DIAGNOSIS — R5383 Other fatigue: Secondary | ICD-10-CM | POA: Diagnosis not present

## 2011-07-18 DIAGNOSIS — I639 Cerebral infarction, unspecified: Secondary | ICD-10-CM

## 2011-07-18 DIAGNOSIS — I6789 Other cerebrovascular disease: Secondary | ICD-10-CM | POA: Diagnosis not present

## 2011-07-18 HISTORY — DX: Personal history of transient ischemic attack (TIA), and cerebral infarction without residual deficits: Z86.73

## 2011-07-18 HISTORY — DX: Cerebral infarction, unspecified: I63.9

## 2011-07-18 LAB — CBC WITH DIFFERENTIAL/PLATELET
Basophils Absolute: 0 10*3/uL (ref 0.0–0.1)
Basophils Relative: 0 % (ref 0–1)
Eosinophils Absolute: 0 10*3/uL (ref 0.0–0.7)
Eosinophils Relative: 1 % (ref 0–5)
HCT: 37.7 % — ABNORMAL LOW (ref 39.0–52.0)
Hemoglobin: 13.1 g/dL (ref 13.0–17.0)
Lymphocytes Relative: 42 % (ref 12–46)
Lymphs Abs: 2 10*3/uL (ref 0.7–4.0)
MCH: 31.6 pg (ref 26.0–34.0)
MCHC: 34.7 g/dL (ref 30.0–36.0)
MCV: 90.8 fL (ref 78.0–100.0)
Monocytes Absolute: 0.3 10*3/uL (ref 0.1–1.0)
Monocytes Relative: 7 % (ref 3–12)
Neutro Abs: 2.5 10*3/uL (ref 1.7–7.7)
Neutrophils Relative %: 51 % (ref 43–77)
Platelets: 229 10*3/uL (ref 150–400)
RBC: 4.15 MIL/uL — ABNORMAL LOW (ref 4.22–5.81)
RDW: 13.4 % (ref 11.5–15.5)
WBC: 4.9 10*3/uL (ref 4.0–10.5)

## 2011-07-18 LAB — BASIC METABOLIC PANEL
BUN: 26 mg/dL — ABNORMAL HIGH (ref 6–23)
CO2: 27 mEq/L (ref 19–32)
Calcium: 9.4 mg/dL (ref 8.4–10.5)
Chloride: 95 mEq/L — ABNORMAL LOW (ref 96–112)
Creatinine, Ser: 1.2 mg/dL (ref 0.50–1.35)
GFR calc Af Amer: 69 mL/min — ABNORMAL LOW (ref >90)
GFR calc non Af Amer: 60 mL/min — ABNORMAL LOW (ref >90)
Glucose, Bld: 264 mg/dL — ABNORMAL HIGH (ref 70–99)
Potassium: 4.4 mEq/L (ref 3.5–5.1)
Sodium: 132 mEq/L — ABNORMAL LOW (ref 135–145)

## 2011-07-18 LAB — POCT I-STAT TROPONIN I: Troponin i, poc: 0.01 ng/mL (ref 0.00–0.08)

## 2011-07-18 MED ORDER — ENOXAPARIN SODIUM 40 MG/0.4ML ~~LOC~~ SOLN: 40.0000 mg | Freq: Every day | SUBCUTANEOUS | Status: DC

## 2011-07-18 MED ORDER — VITAMIN D3 25 MCG (1000 UNIT) PO TABS
1000.0000 [IU] | ORAL_TABLET | Freq: Every day | ORAL | Status: DC
  Administered 2011-07-19 – 2011-07-20 (×2): 1000 [IU] via ORAL
  Filled 2011-07-18 (×2): qty 1

## 2011-07-18 MED ORDER — DULOXETINE HCL 30 MG PO CPEP
30.0000 mg | ORAL_CAPSULE | Freq: Every day | ORAL | Status: DC
  Administered 2011-07-19 – 2011-07-20 (×2): 30 mg via ORAL
  Filled 2011-07-18 (×2): qty 1

## 2011-07-18 MED ORDER — SODIUM CHLORIDE 0.9 % IJ SOLN: 3.0000 mL | INTRAMUSCULAR | Status: DC | PRN

## 2011-07-18 MED ORDER — ENOXAPARIN SODIUM 40 MG/0.4ML ~~LOC~~ SOLN
40.0000 mg | Freq: Every day | SUBCUTANEOUS | Status: DC
  Administered 2011-07-18 – 2011-07-19 (×2): 40 mg via SUBCUTANEOUS
  Filled 2011-07-18 (×3): qty 0.4

## 2011-07-18 MED ORDER — FERROUS SULFATE 325 (65 FE) MG PO TABS
325.0000 mg | ORAL_TABLET | Freq: Every day | ORAL | Status: DC
  Administered 2011-07-20: 325 mg via ORAL
  Filled 2011-07-18 (×3): qty 1

## 2011-07-18 MED ORDER — VALACYCLOVIR HCL 500 MG PO TABS
500.0000 mg | ORAL_TABLET | Freq: Every day | ORAL | Status: DC
  Administered 2011-07-19 – 2011-07-20 (×2): 500 mg via ORAL
  Filled 2011-07-18 (×2): qty 1

## 2011-07-18 MED ORDER — RAMIPRIL 2.5 MG PO CAPS
2.5000 mg | ORAL_CAPSULE | Freq: Every day | ORAL | Status: DC
  Administered 2011-07-19 – 2011-07-20 (×2): 2.5 mg via ORAL
  Filled 2011-07-18 (×2): qty 1

## 2011-07-18 MED ORDER — SODIUM CHLORIDE 0.9 % IV SOLN: 250.0000 mL | INTRAVENOUS | Status: DC | PRN

## 2011-07-18 MED ORDER — SODIUM CHLORIDE 0.9 % IJ SOLN
3.0000 mL | Freq: Two times a day (BID) | INTRAMUSCULAR | Status: DC
  Administered 2011-07-18 – 2011-07-20 (×4): 3 mL via INTRAVENOUS

## 2011-07-18 MED ORDER — DUTASTERIDE 0.5 MG PO CAPS
0.5000 mg | ORAL_CAPSULE | Freq: Every day | ORAL | Status: DC
  Administered 2011-07-19 – 2011-07-20 (×2): 0.5 mg via ORAL
  Filled 2011-07-18 (×2): qty 1

## 2011-07-18 MED ORDER — ASPIRIN 81 MG PO CHEW
162.0000 mg | CHEWABLE_TABLET | Freq: Every day | ORAL | Status: DC
  Administered 2011-07-19 – 2011-07-20 (×2): 162 mg via ORAL
  Filled 2011-07-18 (×2): qty 2

## 2011-07-18 MED ORDER — ATORVASTATIN CALCIUM 20 MG PO TABS
20.0000 mg | ORAL_TABLET | Freq: Every day | ORAL | Status: DC
  Administered 2011-07-19: 20 mg via ORAL
  Filled 2011-07-18 (×2): qty 1

## 2011-07-18 MED ORDER — PREGABALIN 100 MG PO CAPS
300.0000 mg | ORAL_CAPSULE | Freq: Two times a day (BID) | ORAL | Status: DC
Start: 2011-07-18 — End: 2011-07-20
  Administered 2011-07-18 – 2011-07-20 (×4): 300 mg via ORAL
  Filled 2011-07-18: qty 1
  Filled 2011-07-18 (×3): qty 2
  Filled 2011-07-18 (×2): qty 1
  Filled 2011-07-18: qty 3

## 2011-07-18 MED ORDER — INSULIN ASPART 100 UNIT/ML ~~LOC~~ SOLN
0.0000 [IU] | Freq: Three times a day (TID) | SUBCUTANEOUS | Status: DC
  Administered 2011-07-19: 3 [IU] via SUBCUTANEOUS
  Administered 2011-07-19: 5 [IU] via SUBCUTANEOUS
  Administered 2011-07-20 (×2): 3 [IU] via SUBCUTANEOUS

## 2011-07-18 MED ORDER — CLOPIDOGREL BISULFATE 75 MG PO TABS
75.0000 mg | ORAL_TABLET | Freq: Every day | ORAL | Status: DC
  Administered 2011-07-20: 75 mg via ORAL
  Filled 2011-07-18 (×3): qty 1

## 2011-07-18 NOTE — ED Provider Notes (Signed)
History     CSN: [id number]  Arrival date & time 07/18/11  1827   First MD Initiated Contact with Patient 07/18/11 1932      Chief Complaint  Patient presents with  . Extremity Weakness  . Numbness    (Consider location/radiation/quality/duration/timing/severity/associated sxs/prior treatment) Patient is a 71 y.o. male presenting with extremity weakness. The history is provided by the patient.  Extremity Weakness This is a new problem. The current episode started more than 2 days ago. Pertinent negatives include no chest pain, no abdominal pain, no headaches and no shortness of breath.   patient has had some generalized weakness that began on Saturday. On Sunday he states he had an episode refill dizzy. Yesterday he noticed it more difficulty using his left leg than normal. He's had a previous stroke with some left-sided deficits. He states this is more severe. He states he feels as if it now and is sitting on his left leg. No trauma. He states he was not able to walk on the treadmill like he normally does. He states he had to use a cane. He had a previous stroke. He also states his left face feels numb. He states it feels like he got a shot of Novocain. No headaches. He states that people he was talking to on the phone said that he sounded like he had a heart attack.  Past Medical History  Diagnosis Date  . Diabetes mellitus   . Coronary artery disease   . Hypertension   . Stroke 2002, 2003  . History of TIAs     Past Surgical History  Procedure Date  . Hernia repair   . Carpal tunnel release     bilateral  . Elbow surgery     right elbow - nerve release  . Cervical disc surgery   . Lungs     "fluid pumped off lungs"  . Coronary stent placement Feb. 2010  . Cardiac catheterization     History reviewed. No pertinent family history.  History  Substance Use Topics  . Smoking status: Never Smoker   . Smokeless tobacco: Never Used  . Alcohol Use: No      Review of  Systems  Constitutional: Negative for activity change and appetite change.  HENT: Negative for neck stiffness.   Eyes: Negative for pain.  Respiratory: Negative for chest tightness and shortness of breath.   Cardiovascular: Negative for chest pain and leg swelling.  Gastrointestinal: Negative for nausea, vomiting, abdominal pain and diarrhea.  Genitourinary: Negative for flank pain.  Musculoskeletal: Positive for gait problem and extremity weakness. Negative for back pain.  Skin: Negative for rash.  Neurological: Positive for dizziness, weakness and numbness. Negative for headaches.  Psychiatric/Behavioral: Negative for behavioral problems.    Allergies  Review of patient's allergies indicates no known allergies.  Home Medications   Current Outpatient Rx  Name Route Sig Dispense Refill  . ASPIRIN 325 MG PO TABS Oral Take 162.5 mg by mouth daily.    Marland Kitchen VITAMIN D 1000 UNITS PO TABS Oral Take 1,000 Units by mouth daily.    Marland Kitchen CLOPIDOGREL BISULFATE 75 MG PO TABS Oral Take 75 mg by mouth daily.    . DULOXETINE HCL 30 MG PO CPEP Oral Take 30 mg by mouth daily.    . DUTASTERIDE 0.5 MG PO CAPS Oral Take 0.5 mg by mouth daily.    Marland Kitchen FERROUS SULFATE 325 (65 FE) MG PO TABS Oral Take 325 mg by mouth daily with breakfast.    .  FUROSEMIDE 20 MG PO TABS Oral Take 20 mg by mouth daily as needed. swelling    . INSULIN DETEMIR 100 UNIT/ML [place] SOLN Subcutaneous Inject 16 Units into the skin daily.    Marland Kitchen METFORMIN HCL 500 MG PO TABS Oral Take 1,000 mg by mouth 2 (two) times daily with a meal.    . ADULT MULTIVITAMIN W/MINERALS CH Oral Take 1 tablet by mouth daily.    Marland Kitchen PREGABALIN 300 MG PO CAPS Oral Take 300 mg by mouth 2 (two) times daily.    Marland Kitchen RAMIPRIL 2.5 MG PO CAPS Oral Take 2.5 mg by mouth daily.    Marland Kitchen REPAGLINIDE 2 MG PO TABS Oral Take 2 mg by mouth 3 (three) times daily before meals.    Marland Kitchen ROSUVASTATIN CALCIUM 10 MG PO TABS Oral Take 10 mg by mouth daily.    Marland Kitchen VALACYCLOVIR HCL 500 MG PO TABS Oral Take  500 mg by mouth daily.      BP 128/70  Pulse 72  Temp 98.6 F (37 C) (Oral)  Resp 15  SpO2 100%  Physical Exam  Nursing note and vitals reviewed. Constitutional: He is oriented to person, place, and time. He appears well-developed and well-nourished.  HENT:  Head: Normocephalic and atraumatic.  Eyes: EOM are normal. Pupils are equal, round, and reactive to light.  Neck: Normal range of motion. Neck supple.  Cardiovascular: Normal rate, regular rhythm and normal heart sounds.   No murmur heard. Pulmonary/Chest: Effort normal and breath sounds normal.  Abdominal: Soft. Bowel sounds are normal. He exhibits no distension and no mass. There is no tenderness. There is no rebound and no guarding.  Musculoskeletal: Normal range of motion. He exhibits no edema.  Neurological: He is alert and oriented to person, place, and time. A cranial nerve deficit is present.       Paresthesias to left face. Asymmetric facies at rest, but good smile. Paresthesias to left arm and left leg. Decreased strength on left lower extremity compared to right. He states this is greater than a normal weakness. Patient cannot raise his left arm above the horizontal. He states this has been chronic since the neck operation. He states he has some weakness to the left hand also. Good grip strength bilaterally.  Skin: Skin is warm and dry.  Psychiatric: He has a normal mood and affect.    ED Course  Procedures (including critical care time)  Labs Reviewed  CBC WITH DIFFERENTIAL - Abnormal; Notable for the following:    RBC 4.15 (*)     HCT 37.7 (*)     All other components within normal limits  BASIC METABOLIC PANEL - Abnormal; Notable for the following:    Sodium 132 (*)     Chloride 95 (*)     Glucose, Bld 264 (*)     BUN 26 (*)     GFR calc non Af Amer 60 (*)     GFR calc Af Amer 69 (*)     All other components within normal limits  POCT I-STAT TROPONIN I   Ct Head Wo Contrast  07/18/2011  *RADIOLOGY  REPORT*  Clinical Data: Left-sided weakness with sensory symptoms that began 2 days ago.  Facial numbness.  CT HEAD WITHOUT CONTRAST  Technique:  Contiguous axial images were obtained from the base of the skull through the vertex without contrast.  Comparison: 11/14/2009.  Findings: Equivocal asymmetric hypodensity, roughly 2 cm in size, of the right frontal opercular cortex ( image 17) could represent  an acute or subacute infarct. Partial volume averaging of the sylvian fissure not excluded however.  Correlate clinically.  Small hypodensity slightly superior, right frontal subcortical white matter (image 19) likely chronic lacune although was not present in 2011.  No acute hemorrhage, mass lesion, hydrocephalus, or extra-axial fluid.  No CT signs of proximal vascular thrombosis.  Moderate vascular calcification is seen in the carotid siphon regions. Calvarium is intact.  There is chronic sinus disease in the right division of the sphenoid.  No air-fluid levels are present. Negative orbits.  No mastoid fluid.  IMPRESSION: Possible acute right hemisphere infarct.  See discussion above.  No intracranial hemorrhage or mass lesion.  Original Report Authenticated By: Elsie Stain, M.D.     No diagnosis found.    Date: 07/18/2011  Rate: 75  Rhythm: normal sinus rhythm  QRS Axis: normal  Intervals: normal  ST/T Wave abnormalities: normal  Conduction Disutrbances:none  Narrative Interpretation: some repolabnormality.  Old EKG Reviewed: unchanged    MDM  Patient with left-sided weakness. CT scan shows acute to subacute infarct. He's not a TPA candidate due to timing. Patient be admitted to medicine.        Juliet Rude. Rubin Payor, MD 07/18/11 2120

## 2011-07-18 NOTE — ED Notes (Signed)
Stroke screen assessment #2 . Pt lungs are clear to auscultation. Pt able to sip and swallow h2o w/o any s/s of coughing, water leaks out of mouth, no choking or breathlessness. Pt able to sip water via straw w/o any coughing choking or breathlessness, no wet gurgly voice noted.  Pt able to eat 1/2 cracker w/o any coughing, choking or breathlessness, no wet gurlgly voice noted. No change in lung sounds after swallow screen. Pt passed swallow screen.  Pt was kept NPO prior to this screen.

## 2011-07-18 NOTE — ED Notes (Addendum)
Pt c/o left sided weakness with pain that feels like pins and needles started on Sunday ,but has worsen . " It feels like im having a Stroke." My lip funny feels like i had Novcaine"

## 2011-07-18 NOTE — H&P (Signed)
PCP:   Miguel Aschoff, MD   Chief Complaint: Left-sided weakness 3 days ago.   HPI: Steve Brown is an 71 y.o. male with multiple medical problems including prior CVA bilaterally over 10 years ago, with resulting mild left hemiparesis, recurrent TIAs, diabetes, hypertension, coronary disease status post prior cardiac stent, on aspirin and Plavix faithfully, presents to the emergency room complaining of increased weakness especially in his left lower extremity. He stated 3 days ago he experienced paresthesia of his left upper extremity and a bit on his left face as well as more pronounced weakness of his left lower extremity. He thought he has musculoskeletal cramps, and try to go to the gym to "work them off". He noted that he could not get on the treadmill anymore than a few minutes. He denied slurred speech, visual changes, problems swallowing, headache, or any other symptomology. Evaluation in emergency room included a head CT showed possible acute or subacute right hemispheric infarct. He has normal white count and hemoglobin, elevated blood sugar to 264, negative troponin, and unremarkable electrolytes. Hospitalist was asked to admit him for further evaluation of his CVA.  Rewiew of Systems:  The patient denies anorexia, fever, weight loss,, vision loss, decreased hearing, hoarseness, chest pain, syncope, dyspnea on exertion, peripheral edema, balance deficits, hemoptysis, abdominal pain, melena, hematochezia, severe indigestion/heartburn, hematuria, incontinence, genital sores, , suspicious skin lesions, transient blindness, difficulty walking, depression, unusual weight change, abnormal bleeding, enlarged lymph nodes, angioedema, and breast masses.    Past Medical History  Diagnosis Date  . Diabetes mellitus   . Coronary artery disease   . Hypertension   . Stroke 2002, 2003  . History of TIAs     Past Surgical History  Procedure Date  . Hernia repair   . Carpal tunnel release    bilateral  . Elbow surgery     right elbow - nerve release  . Cervical disc surgery   . Lungs     "fluid pumped off lungs"  . Coronary stent placement Feb. 2010  . Cardiac catheterization     Medications:  HOME MEDS: Prior to Admission medications   Medication Sig Start Date End Date Taking? Authorizing Provider  aspirin 325 MG tablet Take 162.5 mg by mouth daily.   Yes Historical Provider, MD  cholecalciferol (VITAMIN D) 1000 UNITS tablet Take 1,000 Units by mouth daily.   Yes Historical Provider, MD  clopidogrel (PLAVIX) 75 MG tablet Take 75 mg by mouth daily.   Yes Historical Provider, MD  DULoxetine (CYMBALTA) 30 MG capsule Take 30 mg by mouth daily.   Yes Historical Provider, MD  dutasteride (AVODART) 0.5 MG capsule Take 0.5 mg by mouth daily.   Yes Historical Provider, MD  ferrous sulfate 325 (65 FE) MG tablet Take 325 mg by mouth daily with breakfast.   Yes Historical Provider, MD  furosemide (LASIX) 20 MG tablet Take 20 mg by mouth daily as needed. swelling   Yes Historical Provider, MD  insulin detemir (LEVEMIR) 100 UNIT/ML injection Inject 16 Units into the skin daily.   Yes Historical Provider, MD  metFORMIN (GLUCOPHAGE) 500 MG tablet Take 1,000 mg by mouth 2 (two) times daily with a meal.   Yes Historical Provider, MD  Multiple Vitamin (MULTIVITAMIN WITH MINERALS) TABS Take 1 tablet by mouth daily.   Yes Historical Provider, MD  pregabalin (LYRICA) 300 MG capsule Take 300 mg by mouth 2 (two) times daily.   Yes Historical Provider, MD  ramipril (ALTACE) 2.5 MG capsule Take 2.5 mg  by mouth daily.   Yes Historical Provider, MD  repaglinide (PRANDIN) 2 MG tablet Take 2 mg by mouth 3 (three) times daily before meals.   Yes Historical Provider, MD  rosuvastatin (CRESTOR) 10 MG tablet Take 10 mg by mouth daily.   Yes Historical Provider, MD  valACYclovir (VALTREX) 500 MG tablet Take 500 mg by mouth daily.   Yes Historical Provider, MD     Allergies:  No Known  Allergies  Social History:   reports that he has never smoked. He has never used smokeless tobacco. He reports that he does not drink alcohol or use illicit drugs.  Family History: History reviewed. No pertinent family history.   Physical Exam: Filed Vitals:   07/18/11 1842 07/18/11 1924 07/18/11 1937 07/18/11 2222  BP: 145/71 128/70    Pulse: 66 69 72   Temp: 98.6 F (37 C)   98.3 F (36.8 C)  TempSrc: Oral     Resp: 20 14 15    SpO2: 99% 100% 100%    Blood pressure 128/70, pulse 72, temperature 98.3 F (36.8 C), temperature source Oral, resp. rate 15, SpO2 100.00%.  GEN:  Pleasant person lying in the stretcher in no acute distress; cooperative with exam PSYCH:  alert and oriented x4; does not appear anxious or depressed; affect is appropriate. HEENT: Mucous membranes pink and anicteric; PERRLA; EOM intact; no cervical lymphadenopathy nor thyromegaly or carotid bruit; no JVD; Breasts:: Not examined CHEST WALL: No tenderness CHEST: Normal respiration, clear to auscultation bilaterally HEART: Regular rate and rhythm; no murmurs rubs or gallops BACK: No kyphosis or scoliosis; no CVA tenderness ABDOMEN: soft non-tender; no masses, no organomegaly, normal abdominal bowel sounds; no pannus; no intertriginous candida. Rectal Exam: Not done EXTREMITIES: No bone or joint deformity; age-appropriate arthropathy of the hands and knees; no edema; no ulcerations. Genitalia: not examined PULSES: 2+ and symmetric SKIN: Normal hydration no rash or ulceration CNS: Cranial nerves 2-12 grossly intact, his speech is fluent, tongue is midline, he has facial symmetry. Uvula elevates with phonation. Left hand grasp is slightly weaker and than right. Left lower extremity is definitely weaker and than the right. Babinski's negative bilaterally. Dorsiflexion and plantar flexion about the same bilaterally.   Labs & Imaging Results for orders placed during the hospital encounter of 07/18/11 (from the  past 48 hour(s))  CBC WITH DIFFERENTIAL     Status: Abnormal   Collection Time   07/18/11  7:55 PM      Component Value Range Comment   WBC 4.9  4.0 - 10.5 K/uL    RBC 4.15 (*) 4.22 - 5.81 MIL/uL    Hemoglobin 13.1  13.0 - 17.0 g/dL    HCT 11.9 (*) 14.7 - 52.0 %    MCV 90.8  78.0 - 100.0 fL    MCH 31.6  26.0 - 34.0 pg    MCHC 34.7  30.0 - 36.0 g/dL    RDW 82.9  56.2 - 13.0 %    Platelets 229  150 - 400 K/uL    Neutrophils Relative 51  43 - 77 %    Neutro Abs 2.5  1.7 - 7.7 K/uL    Lymphocytes Relative 42  12 - 46 %    Lymphs Abs 2.0  0.7 - 4.0 K/uL    Monocytes Relative 7  3 - 12 %    Monocytes Absolute 0.3  0.1 - 1.0 K/uL    Eosinophils Relative 1  0 - 5 %    Eosinophils Absolute 0.0  0.0 - 0.7 K/uL    Basophils Relative 0  0 - 1 %    Basophils Absolute 0.0  0.0 - 0.1 K/uL   BASIC METABOLIC PANEL     Status: Abnormal   Collection Time   07/18/11  7:55 PM      Component Value Range Comment   Sodium 132 (*) 135 - 145 mEq/L    Potassium 4.4  3.5 - 5.1 mEq/L    Chloride 95 (*) 96 - 112 mEq/L    CO2 27  19 - 32 mEq/L    Glucose, Bld 264 (*) 70 - 99 mg/dL    BUN 26 (*) 6 - 23 mg/dL    Creatinine, Ser 9.60  0.50 - 1.35 mg/dL    Calcium 9.4  8.4 - 45.4 mg/dL    GFR calc non Af Amer 60 (*) >90 mL/min    GFR calc Af Amer 69 (*) >90 mL/min   POCT I-STAT TROPONIN I     Status: Normal   Collection Time   07/18/11  8:00 PM      Component Value Range Comment   Troponin i, poc 0.01  0.00 - 0.08 ng/mL    Comment 3             Ct Head Wo Contrast  07/18/2011  *RADIOLOGY REPORT*  Clinical Data: Left-sided weakness with sensory symptoms that began 2 days ago.  Facial numbness.  CT HEAD WITHOUT CONTRAST  Technique:  Contiguous axial images were obtained from the base of the skull through the vertex without contrast.  Comparison: 11/14/2009.  Findings: Equivocal asymmetric hypodensity, roughly 2 cm in size, of the right frontal opercular cortex ( image 17) could represent an acute or subacute  infarct. Partial volume averaging of the sylvian fissure not excluded however.  Correlate clinically.  Small hypodensity slightly superior, right frontal subcortical white matter (image 19) likely chronic lacune although was not present in 2011.  No acute hemorrhage, mass lesion, hydrocephalus, or extra-axial fluid.  No CT signs of proximal vascular thrombosis.  Moderate vascular calcification is seen in the carotid siphon regions. Calvarium is intact.  There is chronic sinus disease in the right division of the sphenoid.  No air-fluid levels are present. Negative orbits.  No mastoid fluid.  IMPRESSION: Possible acute right hemisphere infarct.  See discussion above.  No intracranial hemorrhage or mass lesion.  Original Report Authenticated By: Elsie Stain, M.D.      Assessment Present on Admission:  .CVA (cerebral infarction) .DM (diabetes mellitus) .HTN (hypertension) .CAD in native artery   PLAN:  This gentleman likely had a right hemispheric stroke 3 to 4 days ago. His ictus excluded any thrombolytic therapy. Since his last stroke was many years ago, we'll admit him for complete stroke workup to include MRI/MRA of the brain, carotid Doppler, and transthoracic echo. He has been on both aspirin and Plavix, and I will continue these 2 medications. He will likely need to get his blood sugar and blood pressure under better control. Will consult physical therapy. For his diabetes, will continue insulin sliding scale but will stop his metformin. His cardiovascular status is stable. Please consult neurology in the morning for any further recommendation. He is stable, full code, and will be admitted to [facility] hospitalist service.   Oher plans as per orders.    Steve Brown 07/18/2011, 11:06 PM

## 2011-07-19 ENCOUNTER — Observation Stay (HOSPITAL_COMMUNITY): Payer: Medicare Other

## 2011-07-19 DIAGNOSIS — I2510 Atherosclerotic heart disease of native coronary artery without angina pectoris: Secondary | ICD-10-CM | POA: Diagnosis not present

## 2011-07-19 DIAGNOSIS — I6789 Other cerebrovascular disease: Secondary | ICD-10-CM | POA: Diagnosis not present

## 2011-07-19 DIAGNOSIS — Z8673 Personal history of transient ischemic attack (TIA), and cerebral infarction without residual deficits: Secondary | ICD-10-CM | POA: Diagnosis not present

## 2011-07-19 DIAGNOSIS — G8190 Hemiplegia, unspecified affecting unspecified side: Secondary | ICD-10-CM

## 2011-07-19 DIAGNOSIS — E119 Type 2 diabetes mellitus without complications: Secondary | ICD-10-CM | POA: Diagnosis not present

## 2011-07-19 DIAGNOSIS — R29898 Other symptoms and signs involving the musculoskeletal system: Secondary | ICD-10-CM | POA: Diagnosis not present

## 2011-07-19 DIAGNOSIS — I10 Essential (primary) hypertension: Secondary | ICD-10-CM | POA: Diagnosis not present

## 2011-07-19 DIAGNOSIS — R209 Unspecified disturbances of skin sensation: Secondary | ICD-10-CM | POA: Diagnosis not present

## 2011-07-19 DIAGNOSIS — I6350 Cerebral infarction due to unspecified occlusion or stenosis of unspecified cerebral artery: Secondary | ICD-10-CM | POA: Diagnosis not present

## 2011-07-19 LAB — RAPID URINE DRUG SCREEN, HOSP PERFORMED
Amphetamines: NOT DETECTED
Barbiturates: NOT DETECTED
Benzodiazepines: NOT DETECTED
Cocaine: NOT DETECTED
Opiates: NOT DETECTED
Tetrahydrocannabinol: NOT DETECTED

## 2011-07-19 LAB — GLUCOSE, CAPILLARY
Glucose-Capillary: 219 mg/dL — ABNORMAL HIGH (ref 70–99)
Glucose-Capillary: 222 mg/dL — ABNORMAL HIGH (ref 70–99)
Glucose-Capillary: 270 mg/dL — ABNORMAL HIGH (ref 70–99)
Glucose-Capillary: 253 mg/dL — ABNORMAL HIGH (ref 70–99)

## 2011-07-19 LAB — LIPID PANEL
Cholesterol: 167 mg/dL (ref 0–200)
HDL: 60 mg/dL (ref >39)
LDL Cholesterol: 91 mg/dL (ref 0–99)
Total CHOL/HDL Ratio: 2.8 RATIO
Triglycerides: 80 mg/dL (ref <150)
VLDL: 16 mg/dL (ref 0–40)

## 2011-07-19 LAB — HEMOGLOBIN A1C
Hgb A1c MFr Bld: 8.5 % — ABNORMAL HIGH (ref <5.7)
Mean Plasma Glucose: 197 mg/dL — ABNORMAL HIGH (ref <117)

## 2011-07-19 MED ORDER — INSULIN DETEMIR 100 UNIT/ML ~~LOC~~ SOLN
16.0000 [IU] | Freq: Every day | SUBCUTANEOUS | Status: DC
  Administered 2011-07-19: 16 [IU] via SUBCUTANEOUS
  Filled 2011-07-19: qty 10

## 2011-07-19 MED ORDER — LORAZEPAM 2 MG/ML IJ SOLN
1.0000 mg | Freq: Once | INTRAMUSCULAR | Status: AC | PRN
  Administered 2011-07-19: 11:00:00 via INTRAVENOUS
  Filled 2011-07-19: qty 1

## 2011-07-19 MED ORDER — ZOLPIDEM TARTRATE 5 MG PO TABS
5.0000 mg | ORAL_TABLET | Freq: Once | ORAL | Status: AC
  Administered 2011-07-19: 5 mg via ORAL
  Filled 2011-07-19: qty 1

## 2011-07-19 MED ORDER — SODIUM CHLORIDE 0.9 % IV SOLN
250.0000 mL | INTRAVENOUS | Status: DC | PRN
Start: 2011-07-19 — End: 2011-07-20
  Administered 2011-07-19: 16:00:00 via INTRAVENOUS

## 2011-07-19 MED ORDER — INSULIN ASPART 100 UNIT/ML ~~LOC~~ SOLN
2.0000 [IU] | Freq: Once | SUBCUTANEOUS | Status: AC
  Administered 2011-07-19: 2 [IU] via SUBCUTANEOUS

## 2011-07-19 MED ORDER — OXYCODONE HCL 5 MG PO TABS
5.0000 mg | ORAL_TABLET | Freq: Once | ORAL | Status: AC
  Administered 2011-07-19: 5 mg via ORAL
  Filled 2011-07-19: qty 1

## 2011-07-19 NOTE — Progress Notes (Signed)
[facility] PROGRESS NOTE  Abelardo Seidner YNW:[id number] DOB: [birth year] DOA: 07/18/2011 PCP: Miguel Aschoff, MD  Assessment/Plan: Principal Problem:  *CVA (cerebral infarction) Active Problems:  DM (diabetes mellitus)  HTN (hypertension)  CAD in native artery  History of recurrent TIAs   Right-sided weakness -MRI/MRA of the brain showed no acute neurologic events. -Patient had previous bilateral strokes, continue aspirin and Plavix. -Stress sometimes can cause th these weaknesses to be prominent at any time. -I asked PT/OT to evaluate home.  Diabetes mellitus type 2 -Continue home medication regimen. -Check hemoglobin A1c, insulin sliding scale and carbohydrate modified diet.  Hypertension -Continue home medications, stable.  History of CVA -Is stable, continue aspirin, Plavix.  Code Status: full Family Communication:  Disposition Plan: Remains as inpatient.  Brief narrative: 71 year old African American male with past medical history of CVA came in with mild right-sided hemiparesis.  Consultants:  None  Procedures:  2-D echocardiogram pending  Antibiotics:  None  HPI/Subjective: Says his weakness is improving.  Objective: Filed Vitals:   07/18/11 1937 07/18/11 2222 07/18/11 2305 07/19/11 0500  BP:   148/84 137/68  Pulse: 72  63 61  Temp:  98.3 F (36.8 C) 98.2 F (36.8 C) 97.7 F (36.5 C)  TempSrc:   Oral Oral  Resp: 15  16 17   Height:   5\' 5"  (1.651 m)   Weight:   72.077 kg (158 lb 14.4 oz)   SpO2: 100%  98% 98%    Intake/Output Summary (Last 24 hours) at 07/19/11 1325 Last data filed at 07/19/11 0600  Gross per 24 hour  Intake      0 ml  Output    300 ml  Net   -300 ml    Exam:  General: Alert and awake, oriented x3, not in any acute distress. HEENT: anicteric sclera, pupils reactive to light and accommodation, EOMI CVS: S1-S2 clear, no murmur rubs or gallops Chest: clear to auscultation bilaterally, no wheezing, rales or  rhonchi Abdomen: soft nontender, nondistended, normal bowel sounds, no organomegaly Extremities: no cyanosis, clubbing or edema noted bilaterally Neuro: Cranial nerves II-XII intact, no focal neurological deficits  Data Reviewed: Basic Metabolic Panel:  Lab 07/18/11 6578  NA 132*  K 4.4  CL 95*  CO2 27  GLUCOSE 264*  BUN 26*  CREATININE 1.20  CALCIUM 9.4  MG --  PHOS --   Liver Function Tests: No results found for this basename: AST:5,ALT:5,ALKPHOS:5,BILITOT:5,PROT:5,ALBUMIN:5 in the last 168 hours No results found for this basename: LIPASE:5,AMYLASE:5 in the last 168 hours No results found for this basename: AMMONIA:5 in the last 168 hours CBC:  Lab 07/18/11 1955  WBC 4.9  NEUTROABS 2.5  HGB 13.1  HCT 37.7*  MCV 90.8  PLT 229   Cardiac Enzymes: No results found for this basename: CKTOTAL:5,CKMB:5,CKMBINDEX:5,TROPONINI:5 in the last 168 hours BNP (last 3 results) No results found for this basename: PROBNP:3 in the last 8760 hours CBG:  Lab 07/19/11 1203 07/19/11 0742  GLUCAP 222* 219*    No results found for this or any previous visit (from the past 240 hour(s)).   Studies: Ct Head Wo Contrast  07/18/2011  *RADIOLOGY REPORT*  Clinical Data: Left-sided weakness with sensory symptoms that began 2 days ago.  Facial numbness.  CT HEAD WITHOUT CONTRAST  Technique:  Contiguous axial images were obtained from the base of the skull through the vertex without contrast.  Comparison: 11/14/2009.  Findings: Equivocal asymmetric hypodensity, roughly 2 cm in size, of the right frontal opercular cortex (  image 17) could represent an acute or subacute infarct. Partial volume averaging of the sylvian fissure not excluded however.  Correlate clinically.  Small hypodensity slightly superior, right frontal subcortical white matter (image 19) likely chronic lacune although was not present in 2011.  No acute hemorrhage, mass lesion, hydrocephalus, or extra-axial fluid.  No CT signs of  proximal vascular thrombosis.  Moderate vascular calcification is seen in the carotid siphon regions. Calvarium is intact.  There is chronic sinus disease in the right division of the sphenoid.  No air-fluid levels are present. Negative orbits.  No mastoid fluid.  IMPRESSION: Possible acute right hemisphere infarct.  See discussion above.  No intracranial hemorrhage or mass lesion.  Original Report Authenticated By: Elsie Stain, M.D.   Mr [facility] Wo Contrast  07/19/2011   *RADIOLOGY REPORT*  Clinical Data:  Left-sided weakness and facial numbness for past 3 days.  Rule out stroke.  MRI BRAIN WITHOUT CONTRAST MRA HEAD WITHOUT CONTRAST  Technique: Multiplanar, multiecho pulse sequences of the brain and surrounding structures were obtained according to standard protocol without intravenous contrast.  Angiographic images of the head were obtained using MRA technique without contrast.  Comparison: 07/18/2011 CT.  06/06/2007 MR.  MRI HEAD  Findings:  Motion degraded exam.  No acute infarct.  Remote infarct inferior aspect left cerebellum, left frontal lobe and posterior left basal ganglia.  Mild global atrophy without hydrocephalus.  No intracranial hemorrhage.  No intracranial mass lesion detected on this unenhanced exam.  Incidentally noted is a left frontal subcutaneous lipoma.  Exophthalmos.  Prior cervical spine fusion with appearance of corpectomy C3 level and inferiorly incompletely assessed.  Mild spinal stenosis C2-3 level.  Paranasal sinus mucosal thickening.  Polypoid opacification right sphenoid sinus.  Partial opacification mid to posterior left ethmoid sinus air cells.  IMPRESSION: Motion degraded exam.  No acute infarct.  Please see above.  MRA HEAD  Findings: Motion degraded exam.  Adequately assessing for aneurysm or evaluating degree of stenosis is therefore limited.  Right vertebral artery is dominant with mild narrowing proximal to the takeoff of the right PICA.  Moderate to marked narrowing of  the nondominant left vertebral artery.  Nonvisualization left PICA.  No high-grade stenosis involving majority of the basilar artery. Basilar tip not adequately assess secondary to motion.  Nonvisualization right AICA.  Flow is seen within portions of the internal carotid arteries, middle cerebral artery and anterior cerebral artery bilaterally. Evaluating for stenosis is not possible secondary to the motion.  IMPRESSION: Exam limited by motion.  Please see above.  Original Report Authenticated By: Fuller Canada, M.D.   Mri Brain Without Contrast  07/19/2011   *RADIOLOGY REPORT*  Clinical Data:  Left-sided weakness and facial numbness for past 3 days.  Rule out stroke.  MRI BRAIN WITHOUT CONTRAST MRA HEAD WITHOUT CONTRAST  Technique: Multiplanar, multiecho pulse sequences of the brain and surrounding structures were obtained according to standard protocol without intravenous contrast.  Angiographic images of the head were obtained using MRA technique without contrast.  Comparison: 07/18/2011 CT.  06/06/2007 MR.  MRI HEAD  Findings:  Motion degraded exam.  No acute infarct.  Remote infarct inferior aspect left cerebellum, left frontal lobe and posterior left basal ganglia.  Mild global atrophy without hydrocephalus.  No intracranial hemorrhage.  No intracranial mass lesion detected on this unenhanced exam.  Incidentally noted is a left frontal subcutaneous lipoma.  Exophthalmos.  Prior cervical spine fusion with appearance of corpectomy C3 level and inferiorly incompletely assessed.  Mild spinal stenosis C2-3 level.  Paranasal sinus mucosal thickening.  Polypoid opacification right sphenoid sinus.  Partial opacification mid to posterior left ethmoid sinus air cells.  IMPRESSION: Motion degraded exam.  No acute infarct.  Please see above.  MRA HEAD  Findings: Motion degraded exam.  Adequately assessing for aneurysm or evaluating degree of stenosis is therefore limited.  Right vertebral artery is dominant with  mild narrowing proximal to the takeoff of the right PICA.  Moderate to marked narrowing of the nondominant left vertebral artery.  Nonvisualization left PICA.  No high-grade stenosis involving majority of the basilar artery. Basilar tip not adequately assess secondary to motion.  Nonvisualization right AICA.  Flow is seen within portions of the internal carotid arteries, middle cerebral artery and anterior cerebral artery bilaterally. Evaluating for stenosis is not possible secondary to the motion.  IMPRESSION: Exam limited by motion.  Please see above.  Original Report Authenticated By: Fuller Canada, M.D.    Scheduled Meds:   . aspirin  162 mg Oral Daily  . atorvastatin  20 mg Oral q1800  . cholecalciferol  1,000 Units Oral Daily  . clopidogrel  75 mg Oral Daily  . DULoxetine  30 mg Oral Daily  . dutasteride  0.5 mg Oral Daily  . enoxaparin (LOVENOX) injection  40 mg Subcutaneous QHS  . ferrous sulfate  325 mg Oral Q breakfast  . insulin aspart  0-9 Units Subcutaneous TID WC  . pregabalin  300 mg Oral BID  . ramipril  2.5 mg Oral Daily  . sodium chloride  3 mL Intravenous Q12H  . valACYclovir  500 mg Oral Daily  . zolpidem  5 mg Oral Once  . DISCONTD: enoxaparin (LOVENOX) injection  40 mg Subcutaneous QHS   Continuous Infusions:    [facility] Pager [phone]  If 7PM-7AM, please contact night-coverage [email or web address] Password [email or web address] 07/19/2011, 1:25 PM   LOS: 1 day

## 2011-07-19 NOTE — Progress Notes (Signed)
  Echocardiogram 2D Echocardiogram has been performed.  Steve Brown 07/19/2011, 3:06 PM

## 2011-07-19 NOTE — Progress Notes (Signed)
VASCULAR LAB PRELIMINARY  PRELIMINARY  PRELIMINARY  PRELIMINARY  Carotid duplex  completed.    Preliminary report:  Bilateral:  No evidence of hemodynamically significant internal carotid artery stenosis.   Vertebral artery flow is antegrade.      Hien Cunliffe, RVT 07/19/2011, 9:24 AM

## 2011-07-19 NOTE — Progress Notes (Signed)
Inpatient Diabetes Program Recommendations  AACE/ADA: New Consensus Statement on Inpatient Glycemic Control (2009)  Target Ranges:  Prepandial:   less than 140 mg/dL      Peak postprandial:   less than 180 mg/dL (1-2 hours)      Critically ill patients:  140 - 180 mg/dL   Reason for Visit: Hyperglycemia  Results for Steve Brown, Steve Brown (MRN [id number]) as of 07/19/2011 14:39  Ref. Range 07/19/2011 07:42 07/19/2011 12:03  Glucose-Capillary Latest Range: 70-99 mg/dL 409 (H) 811 (H)  Results for [name], [name] (MRN [id number]) as of 07/19/2011 14:39  Ref. Range 07/18/2011 19:55  Hemoglobin A1C Latest Range: <5.7 % 8.5 (H)  On Levemir 16 units QHS and metformin at home.  Inpatient Diabetes Program Recommendations Insulin - Basal: Needs basal insulin - Add Levemir 16 units QHS  Note: Will follow.

## 2011-07-19 NOTE — Progress Notes (Signed)
INITIAL ADULT NUTRITION ASSESSMENT Date: 07/19/2011   Time: 2:13 PM Reason for Assessment: Nutrition Risk for unintentional weight loss > 10 lb.  ASSESSMENT: Male 71 y.o.  Dx: CVA (cerebral infarction)  Hx:  Past Medical History  Diagnosis Date  . Diabetes mellitus   . Coronary artery disease   . Hypertension   . Stroke 2002, 2003  . History of TIAs     Related Meds:  Scheduled Meds:   . aspirin  162 mg Oral Daily  . atorvastatin  20 mg Oral q1800  . cholecalciferol  1,000 Units Oral Daily  . clopidogrel  75 mg Oral Daily  . DULoxetine  30 mg Oral Daily  . dutasteride  0.5 mg Oral Daily  . enoxaparin (LOVENOX) injection  40 mg Subcutaneous QHS  . ferrous sulfate  325 mg Oral Q breakfast  . insulin aspart  0-9 Units Subcutaneous TID WC  . pregabalin  300 mg Oral BID  . ramipril  2.5 mg Oral Daily  . sodium chloride  3 mL Intravenous Q12H  . valACYclovir  500 mg Oral Daily  . zolpidem  5 mg Oral Once  . DISCONTD: enoxaparin (LOVENOX) injection  40 mg Subcutaneous QHS   Continuous Infusions:  PRN Meds:.sodium chloride, LORazepam, sodium chloride, DISCONTD: sodium chloride   Ht: 5\' 5"  (165.1 cm)  Wt: 158 lb 14.4 oz (72.077 kg)  Ideal Wt: 61.8 kg % Ideal Wt: 116% Wt Readings from Last 10 Encounters:  07/18/11 158 lb 14.4 oz (72.077 kg)    Usual Wt: 188 lb per patient in 2007 % Usual Wt: 84%  BMI: 26.2 kg/m^2 (Overweight)  Food/Nutrition Related Hx: Patient reported his appetite and intake have been well. He reported he recently did a challenge at the [facility] and lost a lot of weight, he reported he feels as though he lost too much weight and would like to gain some back. He asked good nutrition questions and is without any further questions at this time. Patient voiced snack preference.   Labs:  CMP     Component Value Date/Time   NA 132* 07/18/2011 1955   K 4.4 07/18/2011 1955   CL 95* 07/18/2011 1955   CO2 27 07/18/2011 1955   GLUCOSE 264* 07/18/2011 1955   BUN 26*  07/18/2011 1955   CREATININE 1.20 07/18/2011 1955   CALCIUM 9.4 07/18/2011 1955   PROT 5.7* 02/07/2009 0510   ALBUMIN 2.7* 02/07/2009 0510   AST 17 02/07/2009 0510   ALT 18 02/07/2009 0510   ALKPHOS 35* 02/07/2009 0510   BILITOT 0.5 02/07/2009 0510   GFRNONAA 60* 07/18/2011 1955   GFRAA 69* 07/18/2011 1955     Intake/Output Summary (Last 24 hours) at 07/19/11 1419 Last data filed at 07/19/11 1300  Gross per 24 hour  Intake    120 ml  Output    300 ml  Net   -180 ml     Diet Order: Carb Control  Supplements/Tube Feeding: none at this time.   IVF:    Estimated Nutritional Needs:   Kcal: 1610-9604 Protein: 79-93 grams Fluid: 1 ml per kcal intake  NUTRITION DIAGNOSIS: -No nutrition diagnosis.   MONITORING/EVALUATION(Goals): PO intake, weights, labs 1. PO intake > 75% at meals and snacks.   EDUCATION NEEDS: -No education needs identified at this time  INTERVENTION: 1. Will order patient high protein snacks BID to increase PO intake.  2. RD to follow for nutrition plan of care.   Dietitian [phone]  DOCUMENTATION CODES Per approved criteria  -  Not Applicable    Iven Finn [facility] 07/19/2011, 2:13 PM

## 2011-07-19 NOTE — Evaluation (Signed)
Physical Therapy Evaluation Patient Details Name: Steve Brown MRN: [id number] DOB: January 16, 1940 Today's Date: 07/19/2011 Time: 4098-1191 PT Time Calculation (min): 17 min  PT Assessment / Plan / Recommendation Clinical Impression  will benefit from PT to  work on high level balance activities to maximize independence for home;     PT Assessment  Patient needs continued PT services    Follow Up Recommendations  No PT follow up    Barriers to Discharge        Equipment Recommendations  None recommended by PT    Recommendations for Other Services     Frequency      Precautions / Restrictions Precautions Precautions: None Restrictions Weight Bearing Restrictions: No   Pertinent Vitals/Pain       Mobility  Bed Mobility Bed Mobility: Not assessed Details for Bed Mobility Assistance: pt up in room upon PT arrival Transfers Transfers: Sit to Stand;Stand to Sit Sit to Stand: 5: Supervision Stand to Sit: 5: Supervision Details for Transfer Assistance: for safety Ambulation/Gait Ambulation/Gait Assistance: 5: Supervision;4: Min guard Ambulation Distance (Feet): 350 Feet Assistive device: None Gait Pattern: Step-through pattern;Decreased stance time - left;Decreased step length - left    Exercises     PT Diagnosis: Difficulty walking  PT Problem List: Decreased balance;Decreased mobility PT Treatment Interventions: Gait training;Stair training;Functional mobility training;Therapeutic activities;Balance training;Patient/family education   PT Goals Acute Rehab PT Goals PT Goal Formulation: With patient Time For Goal Achievement: 07/26/11 Potential to Achieve Goals: Good Pt will go Sit to Stand: Independently PT Goal: Sit to Stand - Progress: Goal set today Pt will Ambulate: >150 feet;Independently PT Goal: Ambulate - Progress: Goal set today Pt will Go Up / Down Stairs: Flight;with rail(s);with supervision PT Goal: Up/Down Stairs - Progress: Goal set today  Visit  Information  Last PT Received On: 07/19/11 Assistance Needed: +1    Subjective Data  Subjective: You heard I was up? Patient Stated Goal: home, but not before ready   Prior Functioning  Home Living Lives With: Alone Type of Home: Apartment Home Access: Stairs to enter Entrance Stairs-Number of Steps: 15 Home Layout: One level Additional Comments: pt works out 4x per week at the [employer] Prior Function Level of Independence: Independent Able to Take Stairs?: Yes Driving: Yes Communication Communication: No difficulties    Cognition  Overall Cognitive Status: Appears within functional limits for tasks assessed/performed Arousal/Alertness: Awake/alert Orientation Level: Appears intact for tasks assessed Behavior During Session: [facility] for tasks performed    Extremity/Trunk Assessment Right Upper Extremity Assessment RUE ROM/Strength/Tone: [facility] for tasks assessed Left Upper Extremity Assessment LUE ROM/Strength/Tone: Deficits LUE ROM/Strength/Tone Deficits: left shoulder flexion and abduction unable to move in full ROM without assist; pt has L UE weakness at baseline Right Lower Extremity Assessment RLE ROM/Strength/Tone: [facility] for tasks assessed RLE Coordination: WFL - gross motor Left Lower Extremity Assessment LLE ROM/Strength/Tone: WFL for tasks assessed LLE Coordination: WFL - gross motor   Balance High Level Balance High Level Balance Activites: Backward walking;Direction changes;Turns;Head turns High Level Balance Comments: no LOB with above  End of Session PT - End of Session Activity Tolerance: Patient tolerated treatment well Patient left: in bed;with call bell/phone within reach;with nursing in room Nurse Communication: Mobility status;Other (comment) (pt to walk with nursing)  GP Functional Assessment Tool Used: clinical judgement Functional Limitation: Mobility: Walking and moving around Mobility: Walking and Moving Around Current Status [phone]): At least 1 percent  but less than 20 percent impaired, limited or restricted Mobility: Walking and Moving Around  Goal Status ([id number]): 0 percent impaired, limited or restricted   [facility] 07/19/2011, 2:05 PM

## 2011-07-19 NOTE — Progress Notes (Signed)
   CARE MANAGEMENT NOTE 07/19/2011  Patient:  Steve Brown, Steve Brown   Account Number:  [id number]  Date Initiated:  07/19/2011  Documentation initiated by:  Jiles Crocker  Subjective/Objective Assessment:   ADMITTED WITH LEFT SIDED WEAKNESS     Action/Plan:   PCP: Miguel Aschoff, MD; LIVES AT HOME WITH FAMILY; POSSIBLY NEEDS HHC AT DISCHARGE, AWAITING ON PT EVALS   Anticipated DC Date:  07/21/2011   Anticipated DC Plan:  HOME W HOME HEALTH SERVICES      DC Planning Services  CM consult       Status of service:  In process, will continue to follow Medicare Important Message given?  NA - LOS <3 / Initial given by admissions (If response is "NO", the following Medicare IM given date fields will be blank)  Per UR Regulation:  Reviewed for med. necessity/level of care/duration of stay  Comments:  07/19/2011- B Satina Jerrell RN, BSN, MHA

## 2011-07-20 DIAGNOSIS — I2510 Atherosclerotic heart disease of native coronary artery without angina pectoris: Secondary | ICD-10-CM

## 2011-07-20 LAB — BASIC METABOLIC PANEL
BUN: 18 mg/dL (ref 6–23)
CO2: 31 mEq/L (ref 19–32)
Calcium: 8.9 mg/dL (ref 8.4–10.5)
Chloride: 102 mEq/L (ref 96–112)
Creatinine, Ser: 1.08 mg/dL (ref 0.50–1.35)
GFR calc Af Amer: 79 mL/min — ABNORMAL LOW (ref >90)
GFR calc non Af Amer: 68 mL/min — ABNORMAL LOW (ref >90)
Glucose, Bld: 180 mg/dL — ABNORMAL HIGH (ref 70–99)
Potassium: 4.3 mEq/L (ref 3.5–5.1)
Sodium: 137 mEq/L (ref 135–145)

## 2011-07-20 LAB — GLUCOSE, CAPILLARY
Glucose-Capillary: 205 mg/dL — ABNORMAL HIGH (ref 70–99)
Glucose-Capillary: 222 mg/dL — ABNORMAL HIGH (ref 70–99)
Glucose-Capillary: 232 mg/dL — ABNORMAL HIGH (ref 70–99)

## 2011-07-20 MED ORDER — OXYCODONE-ACETAMINOPHEN 5-325 MG PO TABS: 1.0000 | ORAL_TABLET | Freq: Four times a day (QID) | ORAL | Status: AC | PRN

## 2011-07-20 NOTE — Progress Notes (Signed)
Physical Therapy Treatment Patient Details Name: Steve Brown MRN: [id number] DOB: Mar 15, 1940 Today's Date: 07/20/2011 Time: 4098-1191 PT Time Calculation (min): 10 min  PT Assessment / Plan / Recommendation Comments on Treatment Session  Pt stated he is suppose to leave today.    Follow Up Recommendations  No PT follow up    Barriers to Discharge        Equipment Recommendations  None recommended by PT    Recommendations for Other Services    Frequency     Plan Discharge plan remains appropriate    Precautions / Restrictions     Pertinent Vitals/Pain     Mobility  Bed Mobility Bed Mobility: Not assessed Details for Bed Mobility Assistance: Pt negociating self around room Transfers Transfers: Sit to Stand;Stand to Sit Sit to Stand: 6: Modified independent (Device/Increase time);From chair/3-in-1 Stand to Sit: 6: Modified independent (Device/Increase time);To chair/3-in-1 Ambulation/Gait Ambulation/Gait Assistance: 5: Supervision Ambulation Distance (Feet): 450 Feet Assistive device: None Ambulation/Gait Assistance Details: good safety cognition and no LOB, mild c/o low back pain that radiates down R hip Gait Pattern: Step-through pattern     PT Goals                                      progressing    Visit Information  Last PT Received On: 07/20/11 Assistance Needed: +1                   End of Session PT - End of Session Equipment Utilized During Treatment: Gait belt Activity Tolerance: Patient tolerated treatment well Patient left: in chair;with call bell/phone within reach   Felecia Shelling  PTA [facility]  Acute  Rehab Pager     [phone]

## 2011-07-20 NOTE — Discharge Summary (Signed)
Physician Discharge Summary  Steve Brown EXB:[id number] DOB: 1940-12-27 DOA: 07/18/2011  PCP: Miguel Aschoff, MD  Admit date: 07/18/2011 Discharge date: 07/20/2011  Recommendations for Outpatient Follow-up:  1. Followup with primary care physician  Discharge Diagnoses:  Principal Problem:  *Hemiparesis Active Problems:  DM (diabetes mellitus)  HTN (hypertension)  CAD in native artery  History of recurrent TIAs   1. Left-sided weakness  Discharge Condition: Stable  Diet recommendation: Heart healthy diet  History of present illness:  Steve Brown is an 71 y.o. male with multiple medical problems including prior CVA bilaterally over 10 years ago, with resulting mild left hemiparesis, recurrent TIAs, diabetes, hypertension, coronary disease status post prior cardiac stent, on aspirin and Plavix faithfully, presents to the emergency room complaining of increased weakness especially in his left lower extremity. He stated 3 days ago he experienced paresthesia of his left upper extremity and a bit on his left face as well as more pronounced weakness of his left lower extremity. He thought he has musculoskeletal cramps, and try to go to the gym to "work them off". He noted that he could not get on the treadmill anymore than a few minutes. He denied slurred speech, visual changes, problems swallowing, headache, or any other symptomology. Evaluation in emergency room included a head CT showed possible acute or subacute right hemispheric infarct. He has normal white count and hemoglobin, elevated blood sugar to 264, negative troponin, and unremarkable electrolytes. Hospitalist was asked to admit him for further evaluation of his CVA.   Hospital Course:   1. Left-sided weakness: Patient had a history of previous bilateral CVA's over 10 years ago, resulted in mild left sided hemiparesis, patient had recurrent TIAs, diabetes. He came into the hospital complaining about "his leg his dragging", because  of his history of strokes, CT scan was done in the emergency department which showed possible acute or subacute right hemispheric infarct. Patient admitted to rule out acute stroke, patient was evaluated by MRI/MRA of the brain which showed no acute neurologic events, patient aspirin and Plavix was continued. Evaluation by PT/OT was done and they both recommended no followup. Then patient complained also about right leg weakness, then he mentioned it might be secondary to pain which limits his movement. Patient said he had low back pain for which she's taking multiple nonnarcotic medications. Patient is walking around without assistance, was deemed safe to be discharged.  2. Diabetes mellitus type 2: Patient hemoglobin A1c is 8.5, patient home medication regimen was not changed, he might need more adjustment in his basal insulin dose. Advised to take blood glucose level log to his primary care physician.  3. Hypertension: This is been stable throughout the hospital stay, no changes were done to his home medications.  4. History of CVA: As mentioned above with resulting mild left sided hemiparesis, continue his aspirin, Plavix and rosuvastatin.  5. Lower back pain: This is seems to be chronic, patient to followup with his primary care physician. He had MRI on 06/25/2009 showed prominent Schmorl's nodule and advanced arthritis which can cause his back pain. Prescription for Percocet as needed was given.  Procedures:  2-D echocardiogram done 07/19/2011 results are pending  Consultations:  None  Discharge Exam: Filed Vitals:   07/20/11 1207  BP: 136/80  Pulse: 70  Temp: 98 F (36.7 C)  Resp: 18   Filed Vitals:   07/19/11 1356 07/19/11 2236 07/20/11 0559 07/20/11 1207  BP: 119/66 110/69 115/70 136/80  Pulse: 78 61 61 70  Temp: 97.5  F (36.4 C) 97.9 F (36.6 C) 98.2 F (36.8 C) 98 F (36.7 C)  TempSrc: Oral Oral Oral Oral  Resp: 18 18 18 18   Height:      Weight:      SpO2: 99% 98%  98% 98%   General: Alert and awake, oriented x3, not in any acute distress. HEENT: anicteric sclera, pupils reactive to light and accommodation, EOMI CVS: S1-S2 clear, no murmur rubs or gallops Chest: clear to auscultation bilaterally, no wheezing, rales or rhonchi Abdomen: soft nontender, nondistended, normal bowel sounds, no organomegaly Extremities: no cyanosis, clubbing or edema noted bilaterally Neuro: Cranial nerves II-XII intact, no focal neurological deficits  Discharge Instructions  Discharge Orders    Future Orders Please Complete By Expires   Increase activity slowly        Medication List  As of 07/20/2011 12:43 PM   TAKE these medications         aspirin 325 MG tablet   Take 162.5 mg by mouth daily.      cholecalciferol 1000 UNITS tablet   Commonly known as: VITAMIN D   Take 1,000 Units by mouth daily.      clopidogrel 75 MG tablet   Commonly known as: PLAVIX   Take 75 mg by mouth daily.      DULoxetine 30 MG capsule   Commonly known as: CYMBALTA   Take 30 mg by mouth daily.      dutasteride 0.5 MG capsule   Commonly known as: AVODART   Take 0.5 mg by mouth daily.      ferrous sulfate 325 (65 FE) MG tablet   Take 325 mg by mouth daily with breakfast.      furosemide 20 MG tablet   Commonly known as: LASIX   Take 20 mg by mouth daily as needed. swelling      insulin detemir 100 UNIT/ML injection   Commonly known as: LEVEMIR   Inject 16 Units into the skin daily.      metFORMIN 500 MG tablet   Commonly known as: GLUCOPHAGE   Take 1,000 mg by mouth 2 (two) times daily with a meal.      multivitamin with minerals Tabs   Take 1 tablet by mouth daily.      oxyCODONE-acetaminophen 5-325 MG per tablet   Commonly known as: PERCOCET   Take 1 tablet by mouth every 6 (six) hours as needed for pain.      pregabalin 300 MG capsule   Commonly known as: LYRICA   Take 300 mg by mouth 2 (two) times daily.      ramipril 2.5 MG capsule   Commonly known as:  ALTACE   Take 2.5 mg by mouth daily.      repaglinide 2 MG tablet   Commonly known as: PRANDIN   Take 2 mg by mouth 3 (three) times daily before meals.      rosuvastatin 10 MG tablet   Commonly known as: CRESTOR   Take 10 mg by mouth daily.      valACYclovir 500 MG tablet   Commonly known as: VALTREX   Take 500 mg by mouth daily.           Follow-up Information    Follow up with [facility], [place] in 1 week.   Contact information:   [address] [place] [address] [phone]           The results of significant diagnostics from this hospitalization (including imaging,  microbiology, ancillary and laboratory) are listed below for reference.    Significant Diagnostic Studies: Ct Head Wo Contrast  07/18/2011  *RADIOLOGY REPORT*  Clinical Data: Left-sided weakness with sensory symptoms that began 2 days ago.  Facial numbness.  CT HEAD WITHOUT CONTRAST  Technique:  Contiguous axial images were obtained from the base of the skull through the vertex without contrast.  Comparison: 11/14/2009.  Findings: Equivocal asymmetric hypodensity, roughly 2 cm in size, of the right frontal opercular cortex ( image 17) could represent an acute or subacute infarct. Partial volume averaging of the sylvian fissure not excluded however.  Correlate clinically.  Small hypodensity slightly superior, right frontal subcortical white matter (image 19) likely chronic lacune although was not present in 2011.  No acute hemorrhage, mass lesion, hydrocephalus, or extra-axial fluid.  No CT signs of proximal vascular thrombosis.  Moderate vascular calcification is seen in the carotid siphon regions. Calvarium is intact.  There is chronic sinus disease in the right division of the sphenoid.  No air-fluid levels are present. Negative orbits.  No mastoid fluid.  IMPRESSION: Possible acute right hemisphere infarct.  See discussion above.  No intracranial hemorrhage or mass lesion.  Original  Report Authenticated By: Elsie Stain, M.D.   Mr [facility] Wo Contrast  07/19/2011   *RADIOLOGY REPORT*  Clinical Data:  Left-sided weakness and facial numbness for past 3 days.  Rule out stroke.  MRI BRAIN WITHOUT CONTRAST MRA HEAD WITHOUT CONTRAST  Technique: Multiplanar, multiecho pulse sequences of the brain and surrounding structures were obtained according to standard protocol without intravenous contrast.  Angiographic images of the head were obtained using MRA technique without contrast.  Comparison: 07/18/2011 CT.  06/06/2007 MR.  MRI HEAD  Findings:  Motion degraded exam.  No acute infarct.  Remote infarct inferior aspect left cerebellum, left frontal lobe and posterior left basal ganglia.  Mild global atrophy without hydrocephalus.  No intracranial hemorrhage.  No intracranial mass lesion detected on this unenhanced exam.  Incidentally noted is a left frontal subcutaneous lipoma.  Exophthalmos.  Prior cervical spine fusion with appearance of corpectomy C3 level and inferiorly incompletely assessed.  Mild spinal stenosis C2-3 level.  Paranasal sinus mucosal thickening.  Polypoid opacification right sphenoid sinus.  Partial opacification mid to posterior left ethmoid sinus air cells.  IMPRESSION: Motion degraded exam.  No acute infarct.  Please see above.  MRA HEAD  Findings: Motion degraded exam.  Adequately assessing for aneurysm or evaluating degree of stenosis is therefore limited.  Right vertebral artery is dominant with mild narrowing proximal to the takeoff of the right PICA.  Moderate to marked narrowing of the nondominant left vertebral artery.  Nonvisualization left PICA.  No high-grade stenosis involving majority of the basilar artery. Basilar tip not adequately assess secondary to motion.  Nonvisualization right AICA.  Flow is seen within portions of the internal carotid arteries, middle cerebral artery and anterior cerebral artery bilaterally. Evaluating for stenosis is not possible  secondary to the motion.  IMPRESSION: Exam limited by motion.  Please see above.  Original Report Authenticated By: Fuller Canada, M.D.   Mri Brain Without Contrast  07/19/2011   *RADIOLOGY REPORT*  Clinical Data:  Left-sided weakness and facial numbness for past 3 days.  Rule out stroke.  MRI BRAIN WITHOUT CONTRAST MRA HEAD WITHOUT CONTRAST  Technique: Multiplanar, multiecho pulse sequences of the brain and surrounding structures were obtained according to standard protocol without intravenous contrast.  Angiographic images of the head were obtained using MRA  technique without contrast.  Comparison: 07/18/2011 CT.  06/06/2007 MR.  MRI HEAD  Findings:  Motion degraded exam.  No acute infarct.  Remote infarct inferior aspect left cerebellum, left frontal lobe and posterior left basal ganglia.  Mild global atrophy without hydrocephalus.  No intracranial hemorrhage.  No intracranial mass lesion detected on this unenhanced exam.  Incidentally noted is a left frontal subcutaneous lipoma.  Exophthalmos.  Prior cervical spine fusion with appearance of corpectomy C3 level and inferiorly incompletely assessed.  Mild spinal stenosis C2-3 level.  Paranasal sinus mucosal thickening.  Polypoid opacification right sphenoid sinus.  Partial opacification mid to posterior left ethmoid sinus air cells.  IMPRESSION: Motion degraded exam.  No acute infarct.  Please see above.  MRA HEAD  Findings: Motion degraded exam.  Adequately assessing for aneurysm or evaluating degree of stenosis is therefore limited.  Right vertebral artery is dominant with mild narrowing proximal to the takeoff of the right PICA.  Moderate to marked narrowing of the nondominant left vertebral artery.  Nonvisualization left PICA.  No high-grade stenosis involving majority of the basilar artery. Basilar tip not adequately assess secondary to motion.  Nonvisualization right AICA.  Flow is seen within portions of the internal carotid arteries, middle cerebral  artery and anterior cerebral artery bilaterally. Evaluating for stenosis is not possible secondary to the motion.  IMPRESSION: Exam limited by motion.  Please see above.  Original Report Authenticated By: Fuller Canada, M.D.    Microbiology: No results found for this or any previous visit (from the past 240 hour(s)).   Labs: Basic Metabolic Panel:  Lab 07/20/11 4098 07/18/11 1955  NA 137 132*  K 4.3 4.4  CL 102 95*  CO2 31 27  GLUCOSE 180* 264*  BUN 18 26*  CREATININE 1.08 1.20  CALCIUM 8.9 9.4  MG -- --  PHOS -- --   Liver Function Tests: No results found for this basename: AST:5,ALT:5,ALKPHOS:5,BILITOT:5,PROT:5,ALBUMIN:5 in the last 168 hours No results found for this basename: LIPASE:5,AMYLASE:5 in the last 168 hours No results found for this basename: AMMONIA:5 in the last 168 hours CBC:  Lab 07/18/11 1955  WBC 4.9  NEUTROABS 2.5  HGB 13.1  HCT 37.7*  MCV 90.8  PLT 229   Cardiac Enzymes: No results found for this basename: CKTOTAL:5,CKMB:5,CKMBINDEX:5,TROPONINI:5 in the last 168 hours BNP: BNP (last 3 results) No results found for this basename: PROBNP:3 in the last 8760 hours CBG:  Lab 07/20/11 1203 07/20/11 0813 07/20/11 0109 07/19/11 2107 07/19/11 1753  GLUCAP 232* 222* 205* 253* 270*    Time coordinating discharge: 40  Signed:  Rivaldo Hineman A  [facility] 07/20/2011, 12:43 PM

## 2011-07-24 DIAGNOSIS — N433 Hydrocele, unspecified: Secondary | ICD-10-CM | POA: Diagnosis not present

## 2011-07-24 DIAGNOSIS — R351 Nocturia: Secondary | ICD-10-CM | POA: Diagnosis not present

## 2011-07-24 DIAGNOSIS — R972 Elevated prostate specific antigen [PSA]: Secondary | ICD-10-CM | POA: Diagnosis not present

## 2011-07-26 DIAGNOSIS — I679 Cerebrovascular disease, unspecified: Secondary | ICD-10-CM | POA: Diagnosis not present

## 2011-07-26 DIAGNOSIS — R51 Headache: Secondary | ICD-10-CM | POA: Diagnosis not present

## 2011-07-26 DIAGNOSIS — E782 Mixed hyperlipidemia: Secondary | ICD-10-CM | POA: Diagnosis not present

## 2011-08-16 DIAGNOSIS — M5126 Other intervertebral disc displacement, lumbar region: Secondary | ICD-10-CM | POA: Diagnosis not present

## 2011-08-16 DIAGNOSIS — G8929 Other chronic pain: Secondary | ICD-10-CM | POA: Diagnosis not present

## 2011-08-16 DIAGNOSIS — M25569 Pain in unspecified knee: Secondary | ICD-10-CM | POA: Diagnosis not present

## 2011-08-16 DIAGNOSIS — M19019 Primary osteoarthritis, unspecified shoulder: Secondary | ICD-10-CM | POA: Diagnosis not present

## 2011-08-16 DIAGNOSIS — Z79899 Other long term (current) drug therapy: Secondary | ICD-10-CM | POA: Diagnosis not present

## 2011-08-17 DIAGNOSIS — I739 Peripheral vascular disease, unspecified: Secondary | ICD-10-CM | POA: Diagnosis not present

## 2011-08-17 DIAGNOSIS — E1059 Type 1 diabetes mellitus with other circulatory complications: Secondary | ICD-10-CM | POA: Diagnosis not present

## 2011-08-17 DIAGNOSIS — L84 Corns and callosities: Secondary | ICD-10-CM | POA: Diagnosis not present

## 2011-08-17 DIAGNOSIS — L608 Other nail disorders: Secondary | ICD-10-CM | POA: Diagnosis not present

## 2011-08-17 DIAGNOSIS — M2040 Other hammer toe(s) (acquired), unspecified foot: Secondary | ICD-10-CM | POA: Diagnosis not present

## 2011-09-14 DIAGNOSIS — I679 Cerebrovascular disease, unspecified: Secondary | ICD-10-CM | POA: Diagnosis not present

## 2011-09-14 DIAGNOSIS — G609 Hereditary and idiopathic neuropathy, unspecified: Secondary | ICD-10-CM | POA: Diagnosis not present

## 2011-09-14 DIAGNOSIS — Z23 Encounter for immunization: Secondary | ICD-10-CM | POA: Diagnosis not present

## 2011-09-14 DIAGNOSIS — E782 Mixed hyperlipidemia: Secondary | ICD-10-CM | POA: Diagnosis not present

## 2011-09-14 DIAGNOSIS — N400 Enlarged prostate without lower urinary tract symptoms: Secondary | ICD-10-CM | POA: Diagnosis not present

## 2011-09-14 DIAGNOSIS — E559 Vitamin D deficiency, unspecified: Secondary | ICD-10-CM | POA: Diagnosis not present

## 2011-09-14 DIAGNOSIS — I10 Essential (primary) hypertension: Secondary | ICD-10-CM | POA: Diagnosis not present

## 2011-09-14 DIAGNOSIS — E119 Type 2 diabetes mellitus without complications: Secondary | ICD-10-CM | POA: Diagnosis not present

## 2011-09-14 DIAGNOSIS — Z0000 Encounter for general adult medical examination without abnormal findings: Secondary | ICD-10-CM | POA: Diagnosis not present

## 2011-10-08 DIAGNOSIS — J209 Acute bronchitis, unspecified: Secondary | ICD-10-CM | POA: Diagnosis not present

## 2011-10-16 DIAGNOSIS — R351 Nocturia: Secondary | ICD-10-CM | POA: Diagnosis not present

## 2011-10-23 DIAGNOSIS — R972 Elevated prostate specific antigen [PSA]: Secondary | ICD-10-CM | POA: Diagnosis not present

## 2011-10-23 DIAGNOSIS — N281 Cyst of kidney, acquired: Secondary | ICD-10-CM | POA: Diagnosis not present

## 2011-10-24 DIAGNOSIS — IMO0002 Reserved for concepts with insufficient information to code with codable children: Secondary | ICD-10-CM | POA: Diagnosis not present

## 2011-10-24 DIAGNOSIS — M5137 Other intervertebral disc degeneration, lumbosacral region: Secondary | ICD-10-CM | POA: Diagnosis not present

## 2011-10-24 DIAGNOSIS — M1990 Unspecified osteoarthritis, unspecified site: Secondary | ICD-10-CM | POA: Diagnosis not present

## 2011-10-24 DIAGNOSIS — M47817 Spondylosis without myelopathy or radiculopathy, lumbosacral region: Secondary | ICD-10-CM | POA: Diagnosis not present

## 2011-11-08 ENCOUNTER — Ambulatory Visit
Admission: RE | Admit: 2011-11-08 | Discharge: 2011-11-08 | Disposition: A | Discharge status: Home / Self Care | Payer: Medicare Other | Source: Ambulatory Visit | Attending: Family Medicine | Admitting: Family Medicine

## 2011-11-08 ENCOUNTER — Other Ambulatory Visit: Payer: Self-pay | Admitting: Family Medicine

## 2011-11-08 DIAGNOSIS — W19XXXA Unspecified fall, initial encounter: Secondary | ICD-10-CM | POA: Diagnosis not present

## 2011-11-08 DIAGNOSIS — R0789 Other chest pain: Secondary | ICD-10-CM

## 2011-11-08 DIAGNOSIS — IMO0001 Reserved for inherently not codable concepts without codable children: Secondary | ICD-10-CM | POA: Diagnosis not present

## 2011-11-08 DIAGNOSIS — M545 Low back pain, unspecified: Secondary | ICD-10-CM

## 2011-11-08 DIAGNOSIS — S0990XA Unspecified injury of head, initial encounter: Secondary | ICD-10-CM | POA: Diagnosis not present

## 2011-11-08 DIAGNOSIS — M25512 Pain in left shoulder: Secondary | ICD-10-CM

## 2011-11-08 DIAGNOSIS — H35379 Puckering of macula, unspecified eye: Secondary | ICD-10-CM | POA: Diagnosis not present

## 2011-11-08 DIAGNOSIS — M25519 Pain in unspecified shoulder: Secondary | ICD-10-CM | POA: Diagnosis not present

## 2011-11-08 DIAGNOSIS — H538 Other visual disturbances: Secondary | ICD-10-CM | POA: Diagnosis not present

## 2011-11-08 DIAGNOSIS — M549 Dorsalgia, unspecified: Secondary | ICD-10-CM | POA: Diagnosis not present

## 2011-11-08 DIAGNOSIS — R42 Dizziness and giddiness: Secondary | ICD-10-CM | POA: Diagnosis not present

## 2011-11-08 DIAGNOSIS — R079 Chest pain, unspecified: Secondary | ICD-10-CM | POA: Diagnosis not present

## 2011-11-09 DIAGNOSIS — I739 Peripheral vascular disease, unspecified: Secondary | ICD-10-CM | POA: Diagnosis not present

## 2011-11-09 DIAGNOSIS — L84 Corns and callosities: Secondary | ICD-10-CM | POA: Diagnosis not present

## 2011-11-09 DIAGNOSIS — L608 Other nail disorders: Secondary | ICD-10-CM | POA: Diagnosis not present

## 2011-11-09 DIAGNOSIS — E1059 Type 1 diabetes mellitus with other circulatory complications: Secondary | ICD-10-CM | POA: Diagnosis not present

## 2011-11-21 DIAGNOSIS — IMO0002 Reserved for concepts with insufficient information to code with codable children: Secondary | ICD-10-CM | POA: Diagnosis not present

## 2011-11-21 DIAGNOSIS — M5137 Other intervertebral disc degeneration, lumbosacral region: Secondary | ICD-10-CM | POA: Diagnosis not present

## 2011-11-24 DIAGNOSIS — N281 Cyst of kidney, acquired: Secondary | ICD-10-CM | POA: Diagnosis not present

## 2011-11-24 DIAGNOSIS — N400 Enlarged prostate without lower urinary tract symptoms: Secondary | ICD-10-CM | POA: Diagnosis not present

## 2011-11-24 DIAGNOSIS — R972 Elevated prostate specific antigen [PSA]: Secondary | ICD-10-CM | POA: Diagnosis not present

## 2011-12-20 DIAGNOSIS — K1239 Other oral mucositis (ulcerative): Secondary | ICD-10-CM | POA: Diagnosis not present

## 2011-12-20 DIAGNOSIS — K0510 Chronic gingivitis, plaque induced: Secondary | ICD-10-CM | POA: Diagnosis not present

## 2011-12-20 DIAGNOSIS — K121 Other forms of stomatitis: Secondary | ICD-10-CM | POA: Diagnosis not present

## 2011-12-28 DIAGNOSIS — M5137 Other intervertebral disc degeneration, lumbosacral region: Secondary | ICD-10-CM | POA: Diagnosis not present

## 2011-12-28 DIAGNOSIS — M79609 Pain in unspecified limb: Secondary | ICD-10-CM | POA: Diagnosis not present

## 2011-12-28 DIAGNOSIS — G894 Chronic pain syndrome: Secondary | ICD-10-CM | POA: Diagnosis not present

## 2011-12-28 DIAGNOSIS — Z79899 Other long term (current) drug therapy: Secondary | ICD-10-CM | POA: Diagnosis not present

## 2011-12-28 DIAGNOSIS — IMO0001 Reserved for inherently not codable concepts without codable children: Secondary | ICD-10-CM | POA: Diagnosis not present

## 2012-01-15 DIAGNOSIS — IMO0001 Reserved for inherently not codable concepts without codable children: Secondary | ICD-10-CM | POA: Diagnosis not present

## 2012-01-25 DIAGNOSIS — M5137 Other intervertebral disc degeneration, lumbosacral region: Secondary | ICD-10-CM | POA: Diagnosis not present

## 2012-01-25 DIAGNOSIS — M79609 Pain in unspecified limb: Secondary | ICD-10-CM | POA: Diagnosis not present

## 2012-01-25 DIAGNOSIS — G894 Chronic pain syndrome: Secondary | ICD-10-CM | POA: Diagnosis not present

## 2012-02-05 DIAGNOSIS — L608 Other nail disorders: Secondary | ICD-10-CM | POA: Diagnosis not present

## 2012-02-05 DIAGNOSIS — L84 Corns and callosities: Secondary | ICD-10-CM | POA: Diagnosis not present

## 2012-02-05 DIAGNOSIS — E1059 Type 1 diabetes mellitus with other circulatory complications: Secondary | ICD-10-CM | POA: Diagnosis not present

## 2012-02-05 DIAGNOSIS — I739 Peripheral vascular disease, unspecified: Secondary | ICD-10-CM | POA: Diagnosis not present

## 2012-02-09 DIAGNOSIS — G894 Chronic pain syndrome: Secondary | ICD-10-CM | POA: Diagnosis not present

## 2012-02-09 DIAGNOSIS — M5137 Other intervertebral disc degeneration, lumbosacral region: Secondary | ICD-10-CM | POA: Diagnosis not present

## 2012-02-09 DIAGNOSIS — IMO0002 Reserved for concepts with insufficient information to code with codable children: Secondary | ICD-10-CM | POA: Diagnosis not present

## 2012-02-09 DIAGNOSIS — IMO0001 Reserved for inherently not codable concepts without codable children: Secondary | ICD-10-CM | POA: Diagnosis not present

## 2012-02-13 DIAGNOSIS — M5137 Other intervertebral disc degeneration, lumbosacral region: Secondary | ICD-10-CM | POA: Diagnosis not present

## 2012-02-16 DIAGNOSIS — J019 Acute sinusitis, unspecified: Secondary | ICD-10-CM | POA: Diagnosis not present

## 2012-03-06 DIAGNOSIS — Z23 Encounter for immunization: Secondary | ICD-10-CM | POA: Diagnosis not present

## 2012-03-06 DIAGNOSIS — K1370 Unspecified lesions of oral mucosa: Secondary | ICD-10-CM | POA: Diagnosis not present

## 2012-03-06 DIAGNOSIS — R109 Unspecified abdominal pain: Secondary | ICD-10-CM | POA: Diagnosis not present

## 2012-03-06 DIAGNOSIS — IMO0001 Reserved for inherently not codable concepts without codable children: Secondary | ICD-10-CM | POA: Diagnosis not present

## 2012-03-06 DIAGNOSIS — K5732 Diverticulitis of large intestine without perforation or abscess without bleeding: Secondary | ICD-10-CM | POA: Diagnosis not present

## 2012-03-12 DIAGNOSIS — IMO0001 Reserved for inherently not codable concepts without codable children: Secondary | ICD-10-CM | POA: Diagnosis not present

## 2012-03-12 DIAGNOSIS — M5137 Other intervertebral disc degeneration, lumbosacral region: Secondary | ICD-10-CM | POA: Diagnosis not present

## 2012-03-12 DIAGNOSIS — Z79899 Other long term (current) drug therapy: Secondary | ICD-10-CM | POA: Diagnosis not present

## 2012-03-12 DIAGNOSIS — M461 Sacroiliitis, not elsewhere classified: Secondary | ICD-10-CM | POA: Diagnosis not present

## 2012-03-12 DIAGNOSIS — G894 Chronic pain syndrome: Secondary | ICD-10-CM | POA: Diagnosis not present

## 2012-03-14 DIAGNOSIS — H43819 Vitreous degeneration, unspecified eye: Secondary | ICD-10-CM | POA: Diagnosis not present

## 2012-03-26 ENCOUNTER — Other Ambulatory Visit: Payer: Self-pay | Admitting: Pain Medicine

## 2012-03-26 DIAGNOSIS — M79609 Pain in unspecified limb: Secondary | ICD-10-CM

## 2012-03-26 DIAGNOSIS — M545 Low back pain, unspecified: Secondary | ICD-10-CM

## 2012-03-28 DIAGNOSIS — M79609 Pain in unspecified limb: Secondary | ICD-10-CM | POA: Diagnosis not present

## 2012-03-28 DIAGNOSIS — IMO0002 Reserved for concepts with insufficient information to code with codable children: Secondary | ICD-10-CM | POA: Diagnosis not present

## 2012-04-02 ENCOUNTER — Ambulatory Visit
Admission: RE | Admit: 2012-04-02 | Discharge: 2012-04-02 | Disposition: A | Discharge status: Home / Self Care | Payer: Medicare Other | Source: Ambulatory Visit | Attending: Pain Medicine | Admitting: Pain Medicine

## 2012-04-02 DIAGNOSIS — M5126 Other intervertebral disc displacement, lumbar region: Secondary | ICD-10-CM | POA: Diagnosis not present

## 2012-04-02 DIAGNOSIS — M48061 Spinal stenosis, lumbar region without neurogenic claudication: Secondary | ICD-10-CM | POA: Diagnosis not present

## 2012-04-02 DIAGNOSIS — M79609 Pain in unspecified limb: Secondary | ICD-10-CM

## 2012-04-02 DIAGNOSIS — M545 Low back pain, unspecified: Secondary | ICD-10-CM

## 2012-04-11 DIAGNOSIS — G894 Chronic pain syndrome: Secondary | ICD-10-CM | POA: Diagnosis not present

## 2012-04-11 DIAGNOSIS — IMO0002 Reserved for concepts with insufficient information to code with codable children: Secondary | ICD-10-CM | POA: Diagnosis not present

## 2012-04-11 DIAGNOSIS — M5380 Other specified dorsopathies, site unspecified: Secondary | ICD-10-CM | POA: Diagnosis not present

## 2012-04-11 DIAGNOSIS — M5137 Other intervertebral disc degeneration, lumbosacral region: Secondary | ICD-10-CM | POA: Diagnosis not present

## 2012-04-11 DIAGNOSIS — M79609 Pain in unspecified limb: Secondary | ICD-10-CM | POA: Diagnosis not present

## 2012-04-15 DIAGNOSIS — I10 Essential (primary) hypertension: Secondary | ICD-10-CM | POA: Diagnosis not present

## 2012-04-15 DIAGNOSIS — I2510 Atherosclerotic heart disease of native coronary artery without angina pectoris: Secondary | ICD-10-CM | POA: Diagnosis not present

## 2012-04-15 DIAGNOSIS — IMO0001 Reserved for inherently not codable concepts without codable children: Secondary | ICD-10-CM | POA: Diagnosis not present

## 2012-04-15 DIAGNOSIS — R634 Abnormal weight loss: Secondary | ICD-10-CM | POA: Diagnosis not present

## 2012-04-15 DIAGNOSIS — E782 Mixed hyperlipidemia: Secondary | ICD-10-CM | POA: Diagnosis not present

## 2012-04-18 DIAGNOSIS — R634 Abnormal weight loss: Secondary | ICD-10-CM | POA: Diagnosis not present

## 2012-04-23 ENCOUNTER — Telehealth: Payer: Self-pay | Admitting: *Deleted

## 2012-04-23 NOTE — Telephone Encounter (Signed)
Pls call pt back and ask him to proceed to the ER if he is having acute Symptoms; it looks like he was last seen in clinic in 6/13, pls relay to patient my recommendation. thx

## 2012-04-24 ENCOUNTER — Telehealth: Payer: Self-pay | Admitting: *Deleted

## 2012-04-24 NOTE — Telephone Encounter (Signed)
Called patient  04/24/12.Marland KitchenMarland KitchenMarland Kitchenas

## 2012-04-24 NOTE — Telephone Encounter (Signed)
Called patient to inform to proceed to ER with acute symptoms, per Dr. Frances Furbish, patient understood and said ok.Marland KitchenMarland Kitchen

## 2012-04-25 ENCOUNTER — Emergency Department (HOSPITAL_COMMUNITY): Payer: Medicare Other

## 2012-04-25 ENCOUNTER — Encounter (HOSPITAL_COMMUNITY): Payer: Self-pay | Admitting: Emergency Medicine

## 2012-04-25 ENCOUNTER — Observation Stay (HOSPITAL_COMMUNITY)
Admission: EM | Admit: 2012-04-25 | Discharge: 2012-04-26 | Disposition: A | Discharge status: Home / Self Care | Payer: Medicare Other | Attending: Internal Medicine | Admitting: Internal Medicine

## 2012-04-25 DIAGNOSIS — E1059 Type 1 diabetes mellitus with other circulatory complications: Secondary | ICD-10-CM | POA: Diagnosis not present

## 2012-04-25 DIAGNOSIS — L608 Other nail disorders: Secondary | ICD-10-CM | POA: Diagnosis not present

## 2012-04-25 DIAGNOSIS — Z8673 Personal history of transient ischemic attack (TIA), and cerebral infarction without residual deficits: Secondary | ICD-10-CM

## 2012-04-25 DIAGNOSIS — I10 Essential (primary) hypertension: Secondary | ICD-10-CM | POA: Diagnosis not present

## 2012-04-25 DIAGNOSIS — J984 Other disorders of lung: Secondary | ICD-10-CM | POA: Diagnosis not present

## 2012-04-25 DIAGNOSIS — I2510 Atherosclerotic heart disease of native coronary artery without angina pectoris: Secondary | ICD-10-CM

## 2012-04-25 DIAGNOSIS — R55 Syncope and collapse: Principal | ICD-10-CM

## 2012-04-25 DIAGNOSIS — E119 Type 2 diabetes mellitus without complications: Secondary | ICD-10-CM | POA: Diagnosis not present

## 2012-04-25 DIAGNOSIS — R7309 Other abnormal glucose: Secondary | ICD-10-CM | POA: Diagnosis not present

## 2012-04-25 DIAGNOSIS — R404 Transient alteration of awareness: Secondary | ICD-10-CM | POA: Diagnosis not present

## 2012-04-25 DIAGNOSIS — I739 Peripheral vascular disease, unspecified: Secondary | ICD-10-CM | POA: Diagnosis not present

## 2012-04-25 LAB — POCT I-STAT, CHEM 8
BUN: 20 mg/dL (ref 6–23)
Calcium, Ion: 1.22 mmol/L (ref 1.13–1.30)
Chloride: 99 mEq/L (ref 96–112)
Creatinine, Ser: 1.2 mg/dL (ref 0.50–1.35)
Glucose, Bld: 323 mg/dL — ABNORMAL HIGH (ref 70–99)
HCT: 35 % — ABNORMAL LOW (ref 39.0–52.0)
Hemoglobin: 11.9 g/dL — ABNORMAL LOW (ref 13.0–17.0)
Potassium: 4.1 mEq/L (ref 3.5–5.1)
Sodium: 136 mEq/L (ref 135–145)
TCO2: 30 mmol/L (ref 0–100)

## 2012-04-25 LAB — CBC WITH DIFFERENTIAL/PLATELET
Basophils Absolute: 0 10*3/uL (ref 0.0–0.1)
Basophils Relative: 0 % (ref 0–1)
Eosinophils Absolute: 0 10*3/uL (ref 0.0–0.7)
Eosinophils Relative: 0 % (ref 0–5)
HCT: 32.3 % — ABNORMAL LOW (ref 39.0–52.0)
Hemoglobin: 11.6 g/dL — ABNORMAL LOW (ref 13.0–17.0)
Lymphocytes Relative: 33 % (ref 12–46)
Lymphs Abs: 2.1 10*3/uL (ref 0.7–4.0)
MCH: 32 pg (ref 26.0–34.0)
MCHC: 35.9 g/dL (ref 30.0–36.0)
MCV: 89 fL (ref 78.0–100.0)
Monocytes Absolute: 0.5 10*3/uL (ref 0.1–1.0)
Monocytes Relative: 8 % (ref 3–12)
Neutro Abs: 3.8 10*3/uL (ref 1.7–7.7)
Neutrophils Relative %: 59 % (ref 43–77)
Platelets: 175 10*3/uL (ref 150–400)
RBC: 3.63 MIL/uL — ABNORMAL LOW (ref 4.22–5.81)
RDW: 12.8 % (ref 11.5–15.5)
WBC: 6.4 10*3/uL (ref 4.0–10.5)

## 2012-04-25 LAB — POCT I-STAT TROPONIN I: Troponin i, poc: 0 ng/mL (ref 0.00–0.08)

## 2012-04-25 LAB — GLUCOSE, CAPILLARY: Glucose-Capillary: 307 mg/dL — ABNORMAL HIGH (ref 70–99)

## 2012-04-25 NOTE — ED Provider Notes (Signed)
Patient seen/examined in the Emergency Department in conjunction with Resident Physician Provider Noe Gens Patient reports syncope earlier today, now back to baseline Exam : he is awake/alert, no arm/leg drift.  No facial droop Plan: likely admission due to cardiac risk factors and potential for cardiac dysrhythmia    Joya Gaskins, MD 04/25/12 2321

## 2012-04-25 NOTE — ED Notes (Signed)
Per EMS, approx 45 min ago, pt was in yard and experienced a near syncopal episode. Pt caught himself on car, and pt states thing were getting black but now things are "slowly coming back." Pt has hx of CVA and TIA. Pt is neuro intact with no deficits. CBG 295. BP 152/74. A/o x4. Pt denies n/v/d.

## 2012-04-25 NOTE — ED Notes (Signed)
Patient transported to X-ray 

## 2012-04-25 NOTE — ED Provider Notes (Signed)
History     CSN: [id number]  Arrival date & time 04/25/12  2156   First MD Initiated Contact with Patient 04/25/12 2208      Chief Complaint  Patient presents with  . Near Syncope    (Consider location/radiation/quality/duration/timing/severity/associated sxs/prior treatment) Patient is a 72 y.o. male presenting with syncope. The history is provided by the patient and a friend.  Loss of Consciousness  This is a new problem. The current episode started less than 1 hour ago. Episode frequency: once. The problem has not changed since onset.He lost consciousness for a period of less than one minute. The problem is associated with normal activity (was in driveway; did not fall or hit head). Associated symptoms include light-headedness and weakness (generalized). Pertinent negatives include abdominal pain, chest pain, diaphoresis, dizziness, fever, headaches, nausea, palpitations, seizures and vomiting. He has tried nothing for the symptoms. His past medical history is significant for CAD, CVA, DM, HTN and TIA.    Past Medical History  Diagnosis Date  . Diabetes mellitus   . Coronary artery disease   . Hypertension   . Stroke 2002, 2003  . History of TIAs     Past Surgical History  Procedure Laterality Date  . Hernia repair    . Carpal tunnel release      bilateral  . Elbow surgery      right elbow - nerve release  . Cervical disc surgery    . Lungs      "fluid pumped off lungs"  . Coronary stent placement  Feb. 2010  . Cardiac catheterization      No family history on file.  History  Substance Use Topics  . Smoking status: Never Smoker   . Smokeless tobacco: Never Used  . Alcohol Use: No      Review of Systems  Constitutional: Positive for fatigue. Negative for fever, chills, diaphoresis, activity change and appetite change.  HENT: Negative for neck pain.   Respiratory: Negative for cough, chest tightness, shortness of breath and wheezing.   Cardiovascular:  Positive for syncope. Negative for chest pain, palpitations and leg swelling.  Gastrointestinal: Negative for nausea, vomiting, abdominal pain, diarrhea and constipation.  Skin: Negative for rash and wound.  Neurological: Positive for syncope, weakness (generalized) and light-headedness. Negative for dizziness, seizures, speech difficulty, numbness and headaches.  All other systems reviewed and are negative.    Allergies  Review of patient's allergies indicates no known allergies.  Home Medications   Current Outpatient Rx  Name  Route  Sig  Dispense  Refill  . aspirin 325 MG tablet   Oral   Take 325 mg by mouth daily.          . cholecalciferol (VITAMIN D) 1000 UNITS tablet   Oral   Take 1,000 Units by mouth daily.         . clopidogrel (PLAVIX) 75 MG tablet   Oral   Take 75 mg by mouth daily.         . DULoxetine (CYMBALTA) 60 MG capsule   Oral   Take 60 mg by mouth daily.         Marland Kitchen dutasteride (AVODART) 0.5 MG capsule   Oral   Take 0.5 mg by mouth daily.         . ferrous sulfate 325 (65 FE) MG tablet   Oral   Take 325 mg by mouth daily with breakfast.         . furosemide (LASIX) 20 MG tablet  Oral   Take 20 mg by mouth daily as needed. swelling         . insulin detemir (LEVEMIR) 100 UNIT/ML injection   Subcutaneous   Inject 26 Units into the skin daily.          . metFORMIN (GLUCOPHAGE) 500 MG tablet   Oral   Take 1,000 mg by mouth 2 (two) times daily with a meal.         . Multiple Vitamin (MULTIVITAMIN WITH MINERALS) TABS   Oral   Take 1 tablet by mouth daily.         . pregabalin (LYRICA) 300 MG capsule   Oral   Take 300 mg by mouth 2 (two) times daily.         . ramipril (ALTACE) 2.5 MG capsule   Oral   Take 2.5 mg by mouth daily.         . repaglinide (PRANDIN) 2 MG tablet   Oral   Take 2 mg by mouth 3 (three) times daily before meals.         . rosuvastatin (CRESTOR) 10 MG tablet   Oral   Take 10 mg by mouth  daily.         . valACYclovir (VALTREX) 500 MG tablet   Oral   Take 500 mg by mouth daily.           BP 141/69  Pulse 68  Temp(Src) 98.5 F (36.9 C) (Oral)  Resp 13  SpO2 97%  Physical Exam  Nursing note and vitals reviewed. Constitutional: He appears well-developed and well-nourished.  HENT:  Head: Normocephalic and atraumatic.  Right Ear: External ear normal.  Left Ear: External ear normal.  Nose: Nose normal.  Mouth/Throat: Oropharynx is clear and moist. No oropharyngeal exudate.  Eyes: Conjunctivae are normal. Pupils are equal, round, and reactive to light.  Neck: Normal range of motion. Neck supple.  Cardiovascular: Normal rate, regular rhythm, normal heart sounds and intact distal pulses.   Pulmonary/Chest: Effort normal and breath sounds normal. No respiratory distress. He has no wheezes. He has no rales. He exhibits no tenderness.  Abdominal: Soft. Bowel sounds are normal. He exhibits no distension and no mass. There is no tenderness. There is no rebound and no guarding.  Musculoskeletal: Normal range of motion. He exhibits no edema and no tenderness.  Neurological: He is alert. He displays normal reflexes. No cranial nerve deficit. He exhibits abnormal muscle tone (mild weakness on left side compared with right side; pt states this is baseline s/p CVA 10 yrs ago). Coordination normal.  Skin: Skin is warm and dry. No rash noted. No erythema. No pallor.  Psychiatric: He has a normal mood and affect. His behavior is normal. Judgment and thought content normal.    ED Course  Procedures (including critical care time)  Labs Reviewed  GLUCOSE, CAPILLARY - Abnormal; Notable for the following:    Glucose-Capillary 307 (*)    All other components within normal limits  CBC WITH DIFFERENTIAL - Abnormal; Notable for the following:    RBC 3.63 (*)    Hemoglobin 11.6 (*)    HCT 32.3 (*)    All other components within normal limits  POCT I-STAT, CHEM 8 - Abnormal; Notable  for the following:    Glucose, Bld 323 (*)    Hemoglobin 11.9 (*)    HCT 35.0 (*)    All other components within normal limits  POCT I-STAT TROPONIN I   Dg Chest 2 View  04/25/2012  *RADIOLOGY REPORT*  Clinical Data: Near syncope.  CHEST - 2 VIEW  Comparison: 11/08/2011  Findings: The heart size and pulmonary vascularity are normal and the lungs are clear except for minimal scarring at the left lung base laterally.  No acute osseous abnormality.  IMPRESSION: No acute disease.   Original Report Authenticated By: Francene Boyers, M.D.     Date: 04/26/2012  Rate: 75 bpm  Rhythm: normal sinus rhythm  QRS Axis: normal  Intervals: normal  ST/T Wave abnormalities: early repolarization  Conduction Disutrbances:none  Narrative Interpretation: Normal sinus rhythm with early repolarization  Old EKG Reviewed: unchanged    1. Syncope   2. CAD (coronary artery disease)   3. History of stroke     MDM  72 yo M w/hx CVA 10 years ago with residual mild left hemiparesis, recurrent TIA's, diabetes, hypertension, coronary artery disease (s/p prior cardiac stent-on aspirin and Plavix), presents after syncopal event at home. Has felt generalized weakness and light-headedness for several days. Prodromal light-headedness and generalized weakness before and after the event today. EKG without evidence of acute ischemia or arrythmia. Orthostatics negative. CXR not c/w pneumonia. Initial troponin negative, Electrolytes WNL. Will admit to hospitalist service for telemetry and observation.           Clemetine Marker, MD 04/26/12 [phone]

## 2012-04-26 DIAGNOSIS — R42 Dizziness and giddiness: Secondary | ICD-10-CM | POA: Diagnosis not present

## 2012-04-26 NOTE — ED Notes (Signed)
Admitting at bedside 

## 2012-04-26 NOTE — ED Provider Notes (Signed)
I have personally seen and examined the patient.  I have discussed the plan of care with the resident.  I have reviewed the documentation on PMH/FH/Soc. History.  I have reviewed the documentation of the resident and agree.  I have reviewed and agree with the ECG interpretation(s) documented by the resident.   Joya Gaskins, MD 04/26/12 [phone]

## 2012-04-26 NOTE — ED Notes (Signed)
Pt given urinal for restroom

## 2012-04-26 NOTE — Consult Note (Signed)
[facility] History and Physical  [name] ZOX:[id number] DOB: [birth year]    PCP:   Miguel Aschoff, MD   Chief Complaint: lightheadedness for one minute.  HPI: Steve Brown is an 72 y.o. male with hx of DM, HTN, CVA, CAD, anxiety, but no hx of CHF, presents to the ER with one minute of lightheadedness.  He denied syncope, chest pain, shortness of breath, palpitation, nausea, vomiting, diaphoresis, fever, chills, or HA.  Evaluation in the ER included normal renal fx test, no drop in Hct, BP normal, and no CHF.  EKG was unremarkable and troponin was negative.  He now feels at his baseline.  Hospitalist was asked to admit him for near syncope.  Rewiew of Systems:  Constitutional: Negative for malaise, fever and chills. No significant weight loss or weight gain Eyes: Negative for eye pain, redness and discharge, diplopia, visual changes, or flashes of light. ENMT: Negative for ear pain, hoarseness, nasal congestion, sinus pressure and sore throat. No headaches; tinnitus, drooling, or problem swallowing. Cardiovascular: Negative for chest pain, palpitations, diaphoresis, dyspnea and peripheral edema. ; No orthopnea, PND Respiratory: Negative for cough, hemoptysis, wheezing and stridor. No pleuritic chestpain. Gastrointestinal: Negative for nausea, vomiting, diarrhea, constipation, abdominal pain, melena, blood in stool, hematemesis, jaundice and rectal bleeding.    Genitourinary: Negative for frequency, dysuria, incontinence,flank pain and hematuria; Musculoskeletal: Negative for back pain and neck pain. Negative for swelling and trauma.;  Skin: . Negative for pruritus, rash, abrasions, bruising and skin lesion.; ulcerations Neuro: Negative for headache,  and neck stiffness. Negative for weakness, altered level of consciousness , altered mental status, extremity weakness, burning feet, involuntary movement, seizure and syncope.  Psych: negative for anxiety, depression, insomnia,  tearfulness, panic attacks, hallucinations, paranoia, suicidal or homicidal ideation   Past Medical History  Diagnosis Date  . Diabetes mellitus   . Coronary artery disease   . Hypertension   . Stroke 2002, 2003  . History of TIAs     Past Surgical History  Procedure Laterality Date  . Hernia repair    . Carpal tunnel release      bilateral  . Elbow surgery      right elbow - nerve release  . Cervical disc surgery    . Lungs      "fluid pumped off lungs"  . Coronary stent placement  Feb. 2010  . Cardiac catheterization      Medications:  HOME MEDS: Prior to Admission medications   Medication Sig Start Date End Date Taking? Authorizing Provider  aspirin 325 MG tablet Take 325 mg by mouth daily.    Yes Historical Provider, MD  cholecalciferol (VITAMIN D) 1000 UNITS tablet Take 1,000 Units by mouth daily.   Yes Historical Provider, MD  clopidogrel (PLAVIX) 75 MG tablet Take 75 mg by mouth daily.   Yes Historical Provider, MD  DULoxetine (CYMBALTA) 60 MG capsule Take 60 mg by mouth daily.   Yes Historical Provider, MD  dutasteride (AVODART) 0.5 MG capsule Take 0.5 mg by mouth daily.   Yes Historical Provider, MD  ferrous sulfate 325 (65 FE) MG tablet Take 325 mg by mouth daily with breakfast.   Yes Historical Provider, MD  furosemide (LASIX) 20 MG tablet Take 20 mg by mouth daily as needed. swelling   Yes Historical Provider, MD  insulin detemir (LEVEMIR) 100 UNIT/ML injection Inject 26 Units into the skin daily.    Yes Historical Provider, MD  metFORMIN (GLUCOPHAGE) 500 MG tablet Take 1,000 mg by mouth 2 (two) times daily  with a meal.   Yes Historical Provider, MD  Multiple Vitamin (MULTIVITAMIN WITH MINERALS) TABS Take 1 tablet by mouth daily.   Yes Historical Provider, MD  pregabalin (LYRICA) 300 MG capsule Take 300 mg by mouth 2 (two) times daily.   Yes Historical Provider, MD  ramipril (ALTACE) 2.5 MG capsule Take 2.5 mg by mouth daily.   Yes Historical Provider, MD   repaglinide (PRANDIN) 2 MG tablet Take 2 mg by mouth 3 (three) times daily before meals.   Yes Historical Provider, MD  rosuvastatin (CRESTOR) 10 MG tablet Take 10 mg by mouth daily.   Yes Historical Provider, MD  valACYclovir (VALTREX) 500 MG tablet Take 500 mg by mouth daily.   Yes Historical Provider, MD     Allergies:  No Known Allergies  Social History:   reports that he has never smoked. He has never used smokeless tobacco. He reports that he does not drink alcohol or use illicit drugs.  Family History: No family history on file.   Physical Exam: Filed Vitals:   04/25/12 2325 04/25/12 2330 04/26/12 0013 04/26/12 0042  BP: 128/69 126/76 138/80 136/72  Pulse: 75 75 61 63  Temp:      TempSrc:      Resp:   11 16  SpO2:   98% 100%   Blood pressure 136/72, pulse 63, temperature 98.5 F (36.9 C), temperature source Oral, resp. rate 16, SpO2 100.00%.  GEN:  Pleasant  patient lying in the stretcher in no acute distress; cooperative with exam. PSYCH:  alert and oriented x4; does not appear anxious or depressed; affect is appropriate. HEENT: Mucous membranes pink and anicteric; PERRLA; EOM intact; no cervical lymphadenopathy nor thyromegaly or carotid bruit; no JVD; There were no stridor. Neck is very supple. Breasts:: Not examined CHEST WALL: No tenderness CHEST: Normal respiration, clear to auscultation bilaterally.  HEART: Regular rate and rhythm.  There are no murmur, rub, or gallops.   BACK: No kyphosis or scoliosis; no CVA tenderness ABDOMEN: soft and non-tender; no masses, no organomegaly, normal abdominal bowel sounds; no pannus; no intertriginous candida. There is no rebound and no distention. Rectal Exam: Not done EXTREMITIES: No bone or joint deformity; age-appropriate arthropathy of the hands and knees; no edema; no ulcerations.  There is no calf tenderness. Genitalia: not examined PULSES: 2+ and symmetric SKIN: Normal hydration no rash or ulceration CNS: Cranial  nerves 2-12 grossly intact no focal lateralizing neurologic deficit.  Speech is fluent; uvula elevated with phonation, facial symmetry and tongue midline. DTR are normal bilaterally, cerebella exam is intact, barbinski is negative and strengths are equaled bilaterally.  No sensory loss.   Labs on Admission:  Basic Metabolic Panel:  Recent Labs Lab 04/25/12 2339  NA 136  K 4.1  CL 99  GLUCOSE 323*  BUN 20  CREATININE 1.20   Liver Function Tests: No results found for this basename: AST, ALT, ALKPHOS, BILITOT, PROT, ALBUMIN,  in the last 168 hours No results found for this basename: LIPASE, AMYLASE,  in the last 168 hours No results found for this basename: AMMONIA,  in the last 168 hours CBC:  Recent Labs Lab 04/25/12 2231 04/25/12 2339  WBC 6.4  --   NEUTROABS 3.8  --   HGB 11.6* 11.9*  HCT 32.3* 35.0*  MCV 89.0  --   PLT 175  --    Cardiac Enzymes: No results found for this basename: CKTOTAL, CKMB, CKMBINDEX, TROPONINI,  in the last 168 hours  CBG:  Recent  Labs Lab 04/25/12 2209  GLUCAP 307*     Radiological Exams on Admission: Dg Chest 2 View  04/25/2012  *RADIOLOGY REPORT*  Clinical Data: Near syncope.  CHEST - 2 VIEW  Comparison: 11/08/2011  Findings: The heart size and pulmonary vascularity are normal and the lungs are clear except for minimal scarring at the left lung base laterally.  No acute osseous abnormality.  IMPRESSION: No acute disease.   Original Report Authenticated By: Francene Boyers, M.D.    Assessment/Plan Near syncope. Stable known CAD TIA Anxiety  PLAN:  I have fully evaluate this gentleman, and he doesn't fail the [place] criteria for syncope.  He is feeling well after given IVF.  I don't think he needs to be admitted to the hospital as the yield for any positive diagnostic test is extremely low.  I spoke with him and he agrees.  He will follow up with his PCP and strict criteria for returning to the ER were given to him as well.  Thank  you for asking me to partake in the care of your nice patient.  Other plans as per orders.  Code Status: FULL Unk Lightning, MD. [facility] Pager [phone] 7pm to 7am.  04/26/2012, 1:12 AM

## 2012-04-26 NOTE — ED Notes (Signed)
Per Dr Conley Rolls please discharge pt home with instructions to follow up with PCP. Continue home medications as rx'd. Return to ED if symptoms return or worsen.

## 2012-05-08 DIAGNOSIS — M545 Low back pain, unspecified: Secondary | ICD-10-CM | POA: Diagnosis not present

## 2012-05-09 ENCOUNTER — Other Ambulatory Visit: Payer: Self-pay | Admitting: Neurosurgery

## 2012-05-10 DIAGNOSIS — J309 Allergic rhinitis, unspecified: Secondary | ICD-10-CM | POA: Diagnosis not present

## 2012-05-10 DIAGNOSIS — J329 Chronic sinusitis, unspecified: Secondary | ICD-10-CM | POA: Diagnosis not present

## 2012-05-15 DIAGNOSIS — M5137 Other intervertebral disc degeneration, lumbosacral region: Secondary | ICD-10-CM | POA: Diagnosis not present

## 2012-05-15 DIAGNOSIS — G894 Chronic pain syndrome: Secondary | ICD-10-CM | POA: Diagnosis not present

## 2012-05-15 DIAGNOSIS — M47817 Spondylosis without myelopathy or radiculopathy, lumbosacral region: Secondary | ICD-10-CM | POA: Diagnosis not present

## 2012-05-15 DIAGNOSIS — G609 Hereditary and idiopathic neuropathy, unspecified: Secondary | ICD-10-CM | POA: Diagnosis not present

## 2012-05-17 DIAGNOSIS — G609 Hereditary and idiopathic neuropathy, unspecified: Secondary | ICD-10-CM | POA: Diagnosis not present

## 2012-05-17 DIAGNOSIS — D649 Anemia, unspecified: Secondary | ICD-10-CM | POA: Diagnosis not present

## 2012-05-17 DIAGNOSIS — Z79899 Other long term (current) drug therapy: Secondary | ICD-10-CM | POA: Diagnosis not present

## 2012-05-17 DIAGNOSIS — R634 Abnormal weight loss: Secondary | ICD-10-CM | POA: Diagnosis not present

## 2012-05-23 DIAGNOSIS — R972 Elevated prostate specific antigen [PSA]: Secondary | ICD-10-CM | POA: Diagnosis not present

## 2012-06-05 DIAGNOSIS — N390 Urinary tract infection, site not specified: Secondary | ICD-10-CM | POA: Diagnosis not present

## 2012-06-05 DIAGNOSIS — N400 Enlarged prostate without lower urinary tract symptoms: Secondary | ICD-10-CM | POA: Diagnosis not present

## 2012-06-10 DIAGNOSIS — R634 Abnormal weight loss: Secondary | ICD-10-CM | POA: Diagnosis not present

## 2012-06-10 DIAGNOSIS — D509 Iron deficiency anemia, unspecified: Secondary | ICD-10-CM | POA: Diagnosis not present

## 2012-06-10 DIAGNOSIS — D126 Benign neoplasm of colon, unspecified: Secondary | ICD-10-CM | POA: Diagnosis not present

## 2012-06-11 DIAGNOSIS — M5137 Other intervertebral disc degeneration, lumbosacral region: Secondary | ICD-10-CM | POA: Diagnosis not present

## 2012-06-13 DIAGNOSIS — N3289 Other specified disorders of bladder: Secondary | ICD-10-CM | POA: Diagnosis not present

## 2012-06-13 DIAGNOSIS — R634 Abnormal weight loss: Secondary | ICD-10-CM | POA: Diagnosis not present

## 2012-06-26 DIAGNOSIS — R634 Abnormal weight loss: Secondary | ICD-10-CM | POA: Diagnosis not present

## 2012-06-26 DIAGNOSIS — IMO0001 Reserved for inherently not codable concepts without codable children: Secondary | ICD-10-CM | POA: Diagnosis not present

## 2012-06-30 ENCOUNTER — Inpatient Hospital Stay (HOSPITAL_COMMUNITY)
Admission: EM | Admit: 2012-06-30 | Discharge: 2012-07-02 | DRG: 176 | Disposition: A | Discharge status: Home / Self Care | Payer: Medicare Other | Attending: Internal Medicine | Admitting: Internal Medicine

## 2012-06-30 ENCOUNTER — Emergency Department (HOSPITAL_COMMUNITY): Payer: Medicare Other

## 2012-06-30 ENCOUNTER — Encounter (HOSPITAL_COMMUNITY): Payer: Self-pay

## 2012-06-30 DIAGNOSIS — I10 Essential (primary) hypertension: Secondary | ICD-10-CM | POA: Diagnosis present

## 2012-06-30 DIAGNOSIS — Z7902 Long term (current) use of antithrombotics/antiplatelets: Secondary | ICD-10-CM | POA: Diagnosis not present

## 2012-06-30 DIAGNOSIS — E118 Type 2 diabetes mellitus with unspecified complications: Secondary | ICD-10-CM | POA: Diagnosis present

## 2012-06-30 DIAGNOSIS — I2510 Atherosclerotic heart disease of native coronary artery without angina pectoris: Secondary | ICD-10-CM | POA: Diagnosis not present

## 2012-06-30 DIAGNOSIS — R069 Unspecified abnormalities of breathing: Secondary | ICD-10-CM | POA: Diagnosis not present

## 2012-06-30 DIAGNOSIS — R319 Hematuria, unspecified: Secondary | ICD-10-CM | POA: Diagnosis not present

## 2012-06-30 DIAGNOSIS — I2699 Other pulmonary embolism without acute cor pulmonale: Secondary | ICD-10-CM | POA: Diagnosis not present

## 2012-06-30 DIAGNOSIS — Z8673 Personal history of transient ischemic attack (TIA), and cerebral infarction without residual deficits: Secondary | ICD-10-CM

## 2012-06-30 DIAGNOSIS — Z9861 Coronary angioplasty status: Secondary | ICD-10-CM | POA: Diagnosis not present

## 2012-06-30 DIAGNOSIS — R5383 Other fatigue: Secondary | ICD-10-CM | POA: Diagnosis not present

## 2012-06-30 DIAGNOSIS — Z79899 Other long term (current) drug therapy: Secondary | ICD-10-CM | POA: Diagnosis not present

## 2012-06-30 DIAGNOSIS — E119 Type 2 diabetes mellitus without complications: Secondary | ICD-10-CM | POA: Diagnosis not present

## 2012-06-30 DIAGNOSIS — D696 Thrombocytopenia, unspecified: Secondary | ICD-10-CM | POA: Diagnosis present

## 2012-06-30 DIAGNOSIS — G8190 Hemiplegia, unspecified affecting unspecified side: Secondary | ICD-10-CM

## 2012-06-30 DIAGNOSIS — IMO0001 Reserved for inherently not codable concepts without codable children: Secondary | ICD-10-CM | POA: Diagnosis present

## 2012-06-30 DIAGNOSIS — R0602 Shortness of breath: Secondary | ICD-10-CM | POA: Diagnosis not present

## 2012-06-30 DIAGNOSIS — Z86711 Personal history of pulmonary embolism: Secondary | ICD-10-CM | POA: Diagnosis present

## 2012-06-30 DIAGNOSIS — Z794 Long term (current) use of insulin: Secondary | ICD-10-CM | POA: Diagnosis not present

## 2012-06-30 DIAGNOSIS — D649 Anemia, unspecified: Secondary | ICD-10-CM | POA: Diagnosis present

## 2012-06-30 DIAGNOSIS — R5381 Other malaise: Secondary | ICD-10-CM | POA: Diagnosis not present

## 2012-06-30 LAB — BASIC METABOLIC PANEL
BUN: 21 mg/dL (ref 6–23)
CO2: 26 mEq/L (ref 19–32)
Calcium: 8.4 mg/dL (ref 8.4–10.5)
Chloride: 101 mEq/L (ref 96–112)
Creatinine, Ser: 1.04 mg/dL (ref 0.50–1.35)
GFR calc Af Amer: 82 mL/min — ABNORMAL LOW (ref >90)
GFR calc non Af Amer: 71 mL/min — ABNORMAL LOW (ref >90)
Glucose, Bld: 336 mg/dL — ABNORMAL HIGH (ref 70–99)
Potassium: 4.3 mEq/L (ref 3.5–5.1)
Sodium: 135 mEq/L (ref 135–145)

## 2012-06-30 LAB — CBC WITH DIFFERENTIAL/PLATELET
Basophils Absolute: 0 10*3/uL (ref 0.0–0.1)
Basophils Relative: 0 % (ref 0–1)
Eosinophils Absolute: 0 10*3/uL (ref 0.0–0.7)
Eosinophils Relative: 0 % (ref 0–5)
HCT: 33.8 % — ABNORMAL LOW (ref 39.0–52.0)
Hemoglobin: 11.8 g/dL — ABNORMAL LOW (ref 13.0–17.0)
Lymphocytes Relative: 34 % (ref 12–46)
Lymphs Abs: 2.2 10*3/uL (ref 0.7–4.0)
MCH: 31.3 pg (ref 26.0–34.0)
MCHC: 34.9 g/dL (ref 30.0–36.0)
MCV: 89.7 fL (ref 78.0–100.0)
Monocytes Absolute: 0.6 10*3/uL (ref 0.1–1.0)
Monocytes Relative: 9 % (ref 3–12)
Neutro Abs: 3.7 10*3/uL (ref 1.7–7.7)
Neutrophils Relative %: 57 % (ref 43–77)
Platelets: 137 10*3/uL — ABNORMAL LOW (ref 150–400)
RBC: 3.77 MIL/uL — ABNORMAL LOW (ref 4.22–5.81)
RDW: 13.2 % (ref 11.5–15.5)
WBC: 6.5 10*3/uL (ref 4.0–10.5)

## 2012-06-30 LAB — TROPONIN I: Troponin I: <0.3 ng/mL (ref <0.30)

## 2012-06-30 LAB — PRO B NATRIURETIC PEPTIDE: Pro B Natriuretic peptide (BNP): 1794 pg/mL — ABNORMAL HIGH (ref 0–125)

## 2012-06-30 MED ORDER — SODIUM CHLORIDE 0.9 % IV SOLN
INTRAVENOUS | Status: DC
  Administered 2012-06-30: 19:00:00 via INTRAVENOUS

## 2012-06-30 MED ORDER — IOHEXOL 350 MG/ML SOLN
100.0000 mL | Freq: Once | INTRAVENOUS | Status: AC | PRN
  Administered 2012-06-30: 100 mL via INTRAVENOUS

## 2012-06-30 NOTE — ED Notes (Signed)
Pt in CT.

## 2012-06-30 NOTE — ED Notes (Signed)
Per [facility], pt from [facility] physicians for SOB and cardiac work up. Pt c/o SOB with exertion for the past 3 days, minor exertion makes it worse. Sent from [facility] for inverted t waves on EKG. Stent placement hx 20g to LAC. VSS.

## 2012-07-01 ENCOUNTER — Inpatient Hospital Stay (HOSPITAL_COMMUNITY): Payer: Medicare Other

## 2012-07-01 ENCOUNTER — Encounter (HOSPITAL_COMMUNITY): Payer: Self-pay | Admitting: Internal Medicine

## 2012-07-01 DIAGNOSIS — I2510 Atherosclerotic heart disease of native coronary artery without angina pectoris: Secondary | ICD-10-CM

## 2012-07-01 DIAGNOSIS — I10 Essential (primary) hypertension: Secondary | ICD-10-CM | POA: Diagnosis not present

## 2012-07-01 DIAGNOSIS — R319 Hematuria, unspecified: Secondary | ICD-10-CM | POA: Diagnosis not present

## 2012-07-01 DIAGNOSIS — I2699 Other pulmonary embolism without acute cor pulmonale: Principal | ICD-10-CM

## 2012-07-01 DIAGNOSIS — E119 Type 2 diabetes mellitus without complications: Secondary | ICD-10-CM | POA: Diagnosis present

## 2012-07-01 DIAGNOSIS — Z86711 Personal history of pulmonary embolism: Secondary | ICD-10-CM | POA: Diagnosis present

## 2012-07-01 LAB — GLUCOSE, CAPILLARY
Glucose-Capillary: 135 mg/dL — ABNORMAL HIGH (ref 70–99)
Glucose-Capillary: 200 mg/dL — ABNORMAL HIGH (ref 70–99)
Glucose-Capillary: 245 mg/dL — ABNORMAL HIGH (ref 70–99)
Glucose-Capillary: 70 mg/dL (ref 70–99)

## 2012-07-01 LAB — COMPREHENSIVE METABOLIC PANEL
ALT: 29 U/L (ref 0–53)
AST: 22 U/L (ref 0–37)
Albumin: 2.9 g/dL — ABNORMAL LOW (ref 3.5–5.2)
Alkaline Phosphatase: 68 U/L (ref 39–117)
BUN: 14 mg/dL (ref 6–23)
CO2: 26 mEq/L (ref 19–32)
Calcium: 8.4 mg/dL (ref 8.4–10.5)
Chloride: 103 mEq/L (ref 96–112)
Creatinine, Ser: 0.93 mg/dL (ref 0.50–1.35)
GFR calc Af Amer: >90 mL/min (ref >90)
GFR calc non Af Amer: 83 mL/min — ABNORMAL LOW (ref >90)
Glucose, Bld: 125 mg/dL — ABNORMAL HIGH (ref 70–99)
Potassium: 3.9 mEq/L (ref 3.5–5.1)
Sodium: 137 mEq/L (ref 135–145)
Total Bilirubin: 0.4 mg/dL (ref 0.3–1.2)
Total Protein: 5.9 g/dL — ABNORMAL LOW (ref 6.0–8.3)

## 2012-07-01 LAB — URINALYSIS, ROUTINE W REFLEX MICROSCOPIC
Bilirubin Urine: NEGATIVE
Glucose, UA: >1000 mg/dL — AB
Ketones, ur: NEGATIVE mg/dL
Leukocytes, UA: NEGATIVE
Nitrite: NEGATIVE
Protein, ur: NEGATIVE mg/dL
Specific Gravity, Urine: 1.026 (ref 1.005–1.030)
Urobilinogen, UA: 0.2 mg/dL (ref 0.0–1.0)
pH: 5 (ref 5.0–8.0)

## 2012-07-01 LAB — CBC WITH DIFFERENTIAL/PLATELET
Basophils Absolute: 0 10*3/uL (ref 0.0–0.1)
Basophils Relative: 0 % (ref 0–1)
Eosinophils Absolute: 0 10*3/uL (ref 0.0–0.7)
Eosinophils Relative: 0 % (ref 0–5)
HCT: 34.9 % — ABNORMAL LOW (ref 39.0–52.0)
Hemoglobin: 12 g/dL — ABNORMAL LOW (ref 13.0–17.0)
Lymphocytes Relative: 44 % (ref 12–46)
Lymphs Abs: 2.7 10*3/uL (ref 0.7–4.0)
MCH: 31.2 pg (ref 26.0–34.0)
MCHC: 34.4 g/dL (ref 30.0–36.0)
MCV: 90.6 fL (ref 78.0–100.0)
Monocytes Absolute: 0.5 10*3/uL (ref 0.1–1.0)
Monocytes Relative: 9 % (ref 3–12)
Neutro Abs: 2.9 10*3/uL (ref 1.7–7.7)
Neutrophils Relative %: 47 % (ref 43–77)
Platelets: 149 10*3/uL — ABNORMAL LOW (ref 150–400)
RBC: 3.85 MIL/uL — ABNORMAL LOW (ref 4.22–5.81)
RDW: 13.3 % (ref 11.5–15.5)
WBC: 6.2 10*3/uL (ref 4.0–10.5)

## 2012-07-01 LAB — HEPARIN LEVEL (UNFRACTIONATED): Heparin Unfractionated: 0.95 IU/mL — ABNORMAL HIGH (ref 0.30–0.70)

## 2012-07-01 LAB — URINE MICROSCOPIC-ADD ON

## 2012-07-01 LAB — TROPONIN I: Troponin I: <0.3 ng/mL (ref <0.30)

## 2012-07-01 LAB — HEMOGLOBIN A1C
Hgb A1c MFr Bld: 11.6 % — ABNORMAL HIGH (ref <5.7)
Mean Plasma Glucose: 286 mg/dL — ABNORMAL HIGH (ref <117)

## 2012-07-01 MED ORDER — HEPARIN (PORCINE) IN NACL 100-0.45 UNIT/ML-% IJ SOLN
1100.0000 [IU]/h | INTRAMUSCULAR | Status: AC
  Administered 2012-07-01: 1100 [IU]/h via INTRAVENOUS
  Filled 2012-07-01: qty 250

## 2012-07-01 MED ORDER — SODIUM CHLORIDE 0.9 % IV SOLN
INTRAVENOUS | Status: DC
  Administered 2012-07-01: 03:00:00 via INTRAVENOUS

## 2012-07-01 MED ORDER — REPAGLINIDE 2 MG PO TABS
2.0000 mg | ORAL_TABLET | Freq: Three times a day (TID) | ORAL | Status: DC
  Administered 2012-07-01 – 2012-07-02 (×4): 2 mg via ORAL
  Filled 2012-07-01 (×6): qty 1

## 2012-07-01 MED ORDER — ADULT MULTIVITAMIN W/MINERALS CH
1.0000 | ORAL_TABLET | Freq: Every evening | ORAL | Status: DC
  Administered 2012-07-01: 1 via ORAL
  Filled 2012-07-01 (×2): qty 1

## 2012-07-01 MED ORDER — ATORVASTATIN CALCIUM 20 MG PO TABS
20.0000 mg | ORAL_TABLET | Freq: Every day | ORAL | Status: DC
  Administered 2012-07-01: 20 mg via ORAL
  Filled 2012-07-01 (×2): qty 1

## 2012-07-01 MED ORDER — INSULIN DETEMIR 100 UNIT/ML ~~LOC~~ SOLN
18.0000 [IU] | Freq: Every day | SUBCUTANEOUS | Status: DC
  Filled 2012-07-01: qty 0.25

## 2012-07-01 MED ORDER — FERROUS SULFATE 325 (65 FE) MG PO TABS
325.0000 mg | ORAL_TABLET | Freq: Every day | ORAL | Status: DC
  Administered 2012-07-01 – 2012-07-02 (×2): 325 mg via ORAL
  Filled 2012-07-01 (×3): qty 1

## 2012-07-01 MED ORDER — INSULIN DETEMIR 100 UNIT/ML ~~LOC~~ SOLN
30.0000 [IU] | Freq: Every day | SUBCUTANEOUS | Status: DC
  Administered 2012-07-01 – 2012-07-02 (×2): 30 [IU] via SUBCUTANEOUS
  Filled 2012-07-01 (×2): qty 0.3

## 2012-07-01 MED ORDER — RIVAROXABAN 20 MG PO TABS: 20.0000 mg | ORAL_TABLET | Freq: Every day | ORAL | Status: DC

## 2012-07-01 MED ORDER — ONDANSETRON HCL 4 MG/2ML IJ SOLN: 4.0000 mg | Freq: Four times a day (QID) | INTRAMUSCULAR | Status: DC | PRN

## 2012-07-01 MED ORDER — VITAMIN D3 25 MCG (1000 UNIT) PO TABS
1000.0000 [IU] | ORAL_TABLET | Freq: Every day | ORAL | Status: DC
  Administered 2012-07-01 – 2012-07-02 (×2): 1000 [IU] via ORAL
  Filled 2012-07-01 (×2): qty 1

## 2012-07-01 MED ORDER — RAMIPRIL 2.5 MG PO CAPS
2.5000 mg | ORAL_CAPSULE | Freq: Every evening | ORAL | Status: DC
  Filled 2012-07-01: qty 1

## 2012-07-01 MED ORDER — DICLOFENAC SODIUM 1 % TD GEL: 1.0000 "application " | Freq: Three times a day (TID) | TRANSDERMAL | Status: DC | PRN

## 2012-07-01 MED ORDER — RIVAROXABAN 15 MG PO TABS
15.0000 mg | ORAL_TABLET | Freq: Two times a day (BID) | ORAL | Status: DC
  Filled 2012-07-01 (×3): qty 1

## 2012-07-01 MED ORDER — INSULIN ASPART 100 UNIT/ML ~~LOC~~ SOLN: 0.0000 [IU] | Freq: Every day | SUBCUTANEOUS | Status: DC

## 2012-07-01 MED ORDER — CLOPIDOGREL BISULFATE 75 MG PO TABS
75.0000 mg | ORAL_TABLET | Freq: Every day | ORAL | Status: DC
  Administered 2012-07-01: 75 mg via ORAL
  Filled 2012-07-01 (×2): qty 1

## 2012-07-01 MED ORDER — INSULIN ASPART 100 UNIT/ML ~~LOC~~ SOLN
0.0000 [IU] | Freq: Three times a day (TID) | SUBCUTANEOUS | Status: DC
  Administered 2012-07-01: 1 [IU] via SUBCUTANEOUS

## 2012-07-01 MED ORDER — REPAGLINIDE 2 MG PO TABS
2.0000 mg | ORAL_TABLET | Freq: Three times a day (TID) | ORAL | Status: DC
  Administered 2012-07-01: 2 mg via ORAL
  Filled 2012-07-01 (×4): qty 1

## 2012-07-01 MED ORDER — INSULIN ASPART 100 UNIT/ML ~~LOC~~ SOLN
0.0000 [IU] | Freq: Three times a day (TID) | SUBCUTANEOUS | Status: DC
  Administered 2012-07-01: 5 [IU] via SUBCUTANEOUS
  Administered 2012-07-01 – 2012-07-02 (×2): 3 [IU] via SUBCUTANEOUS

## 2012-07-01 MED ORDER — TAPENTADOL HCL 50 MG PO TABS: 50.0000 mg | ORAL_TABLET | Freq: Three times a day (TID) | ORAL | Status: DC | PRN

## 2012-07-01 MED ORDER — HEPARIN (PORCINE) IN NACL 100-0.45 UNIT/ML-% IJ SOLN
1000.0000 [IU]/h | INTRAMUSCULAR | Status: DC
  Filled 2012-07-01: qty 250

## 2012-07-01 MED ORDER — GLUCERNA SHAKE PO LIQD
237.0000 mL | Freq: Three times a day (TID) | ORAL | Status: DC
  Administered 2012-07-01 – 2012-07-02 (×4): 237 mL via ORAL

## 2012-07-01 MED ORDER — ONDANSETRON HCL 4 MG PO TABS: 4.0000 mg | ORAL_TABLET | Freq: Four times a day (QID) | ORAL | Status: DC | PRN

## 2012-07-01 MED ORDER — ASPIRIN 325 MG PO TABS
325.0000 mg | ORAL_TABLET | Freq: Every day | ORAL | Status: DC
  Filled 2012-07-01: qty 1

## 2012-07-01 MED ORDER — RIVAROXABAN 15 MG PO TABS
15.0000 mg | ORAL_TABLET | Freq: Two times a day (BID) | ORAL | Status: DC
  Administered 2012-07-01: 15 mg via ORAL
  Filled 2012-07-01 (×3): qty 1

## 2012-07-01 MED ORDER — DULOXETINE HCL 60 MG PO CPEP
60.0000 mg | ORAL_CAPSULE | Freq: Every day | ORAL | Status: DC
  Administered 2012-07-01 – 2012-07-02 (×2): 60 mg via ORAL
  Filled 2012-07-01 (×2): qty 1

## 2012-07-01 MED ORDER — ACETAMINOPHEN 650 MG RE SUPP: 650.0000 mg | Freq: Four times a day (QID) | RECTAL | Status: DC | PRN

## 2012-07-01 MED ORDER — HEPARIN SODIUM (PORCINE) 5000 UNIT/ML IJ SOLN: 4500.0000 [IU] | Freq: Once | INTRAMUSCULAR | Status: AC

## 2012-07-01 MED ORDER — HEPARIN (PORCINE) IN NACL 100-0.45 UNIT/ML-% IJ SOLN
1100.0000 [IU]/h | Freq: Once | INTRAMUSCULAR | Status: DC
  Filled 2012-07-01: qty 250

## 2012-07-01 MED ORDER — VALACYCLOVIR HCL 500 MG PO TABS
500.0000 mg | ORAL_TABLET | Freq: Every day | ORAL | Status: DC
  Administered 2012-07-01 – 2012-07-02 (×2): 500 mg via ORAL
  Filled 2012-07-01 (×2): qty 1

## 2012-07-01 MED ORDER — SODIUM CHLORIDE 0.9 % IJ SOLN
3.0000 mL | Freq: Two times a day (BID) | INTRAMUSCULAR | Status: DC
  Administered 2012-07-01 – 2012-07-02 (×2): 3 mL via INTRAVENOUS

## 2012-07-01 MED ORDER — DUTASTERIDE 0.5 MG PO CAPS
0.5000 mg | ORAL_CAPSULE | Freq: Every day | ORAL | Status: DC
  Administered 2012-07-01 – 2012-07-02 (×2): 0.5 mg via ORAL
  Filled 2012-07-01 (×2): qty 1

## 2012-07-01 MED ORDER — PREGABALIN 75 MG PO CAPS
300.0000 mg | ORAL_CAPSULE | Freq: Two times a day (BID) | ORAL | Status: DC
  Administered 2012-07-01 – 2012-07-02 (×4): 300 mg via ORAL
  Filled 2012-07-01: qty 1
  Filled 2012-07-01 (×3): qty 4
  Filled 2012-07-01: qty 3

## 2012-07-01 MED ORDER — SODIUM CHLORIDE 0.9 % IV SOLN
INTRAVENOUS | Status: DC
  Administered 2012-07-01: 02:00:00 via INTRAVENOUS

## 2012-07-01 MED ORDER — ACETAMINOPHEN 325 MG PO TABS: 650.0000 mg | ORAL_TABLET | Freq: Four times a day (QID) | ORAL | Status: DC | PRN

## 2012-07-01 MED ORDER — INSULIN ASPART 100 UNIT/ML ~~LOC~~ SOLN
4.0000 [IU] | Freq: Three times a day (TID) | SUBCUTANEOUS | Status: DC
  Administered 2012-07-01 – 2012-07-02 (×3): 4 [IU] via SUBCUTANEOUS

## 2012-07-01 NOTE — H&P (Addendum)
[facility] History and Physical  Steve Brown ZOX:[id number] DOB: Jan 27, 1940 DOA: 06/30/2012  Referring physician:ER physician. PCP: Steve Aschoff, MD  Specialists: Dr. Eldridge Dace cardiologist.  Chief Complaint: shortness of breath.  HPI: Steve Brown is a 72 y.o. male with known history of CAD status post stenting, previous history of TIAs, diabetes mellitus type 2, anemia started experiencing shortness of breath over the last 3 days. Patient's shortness of breath is present on exertion denies any associated chest pain or any fever chills or productive cough. Patient had gone to his PCPs office and EKG showed T wave inversion in V3 V4 and patient was referred to the ER concerning for ACS. In the ER CT angiogram of the chest shows pulmonary embolism. Patient is hemodynamically stable and has been started on IV heparin. Patient denies any recent travel or any recent surgery. Patient has not had any previous history of PE on DVT. Denies any fever chills dizziness loss of consciousness.  Review of Systems: As presented in the history of presenting illness, rest negative.  Past Medical History  Diagnosis Date  . Diabetes mellitus   . Coronary artery disease   . Hypertension   . Stroke 2002, 2003  . History of TIAs    Past Surgical History  Procedure Laterality Date  . Hernia repair    . Carpal tunnel release      bilateral  . Elbow surgery      right elbow - nerve release  . Cervical disc surgery    . Lungs      "fluid pumped off lungs"  . Coronary stent placement  Feb. 2010  . Cardiac catheterization     Social History:  reports that he has never smoked. He has never used smokeless tobacco. He reports that he does not drink alcohol or use illicit drugs. Home. where does patient live-- Can do ADLs. Can patient participate in ADLs?  No Known Allergies  History reviewed. No pertinent family history. negative for PE.   Prior to Admission medications   Medication Sig  Start Date End Date Taking? Authorizing Provider  aspirin 325 MG tablet Take 325 mg by mouth daily.    Yes Historical Provider, MD  cholecalciferol (VITAMIN D) 1000 UNITS tablet Take 1,000 Units by mouth daily.   Yes Historical Provider, MD  clopidogrel (PLAVIX) 75 MG tablet Take 75 mg by mouth daily.   Yes Historical Provider, MD  diclofenac sodium (VOLTAREN) 1 % GEL Apply 1 application topically 3 (three) times daily as needed (for pain in left arm and shoulder).   Yes Historical Provider, MD  DULoxetine (CYMBALTA) 60 MG capsule Take 60 mg by mouth daily.   Yes Historical Provider, MD  dutasteride (AVODART) 0.5 MG capsule Take 0.5 mg by mouth daily.   Yes Historical Provider, MD  ferrous sulfate 325 (65 FE) MG tablet Take 325 mg by mouth daily with breakfast.   Yes Historical Provider, MD  furosemide (LASIX) 20 MG tablet Take 20 mg by mouth daily as needed (swelling).    Yes Historical Provider, MD  insulin detemir (LEVEMIR) 100 UNIT/ML injection Inject 18-25 Units into the skin daily. Based on sugar levels   Yes Historical Provider, MD  metFORMIN (GLUCOPHAGE) 500 MG tablet Take 1,000 mg by mouth 2 (two) times daily with a meal.   Yes Historical Provider, MD  Multiple Vitamin (MULTIVITAMIN WITH MINERALS) TABS Take 1 tablet by mouth every evening.    Yes Historical Provider, MD  pregabalin (LYRICA) 300 MG capsule Take 300 mg  by mouth 2 (two) times daily.   Yes Historical Provider, MD  ramipril (ALTACE) 2.5 MG capsule Take 2.5 mg by mouth every evening.    Yes Historical Provider, MD  repaglinide (PRANDIN) 2 MG tablet Take 2 mg by mouth 3 (three) times daily before meals.   Yes Historical Provider, MD  rosuvastatin (CRESTOR) 10 MG tablet Take 10 mg by mouth every evening.    Yes Historical Provider, MD  tapentadol (NUCYNTA) 50 MG TABS Take 50 mg by mouth 3 (three) times daily as needed (for pain).   Yes Historical Provider, MD  valACYclovir (VALTREX) 500 MG tablet Take 500 mg by mouth daily.   Yes  Historical Provider, MD   Physical Exam: Filed Vitals:   06/30/12 2215 06/30/12 2230 06/30/12 2345 07/01/12 0000  BP: 111/75 104/71 108/74 123/78  Pulse: 95 72 70 68  Temp:      TempSrc:      Resp: 18 18 16    SpO2: 98% 100% 99% 100%     General:  Well-developed well-nourished.  Eyes: anicteric no pallor.  ENT: no discharge from ears eyes nose mouth.  Neck: no mass felt.  Cardiovascular: S1-S2 heard.  Respiratory: no rhonchi or crepitations.  Abdomen: soft nontender bowel sounds present.  Skin: no rash.  Musculoskeletal: no edema.  Psychiatric: appears normal.  Neurologic: alert awake oriented to time place and person. Moves all extremities.  Labs on Admission:  Basic Metabolic Panel:  Recent Labs Lab 06/30/12 1849  NA 135  K 4.3  CL 101  CO2 26  GLUCOSE 336*  BUN 21  CREATININE 1.04  CALCIUM 8.4   Liver Function Tests: No results found for this basename: AST, ALT, ALKPHOS, BILITOT, PROT, ALBUMIN,  in the last 168 hours No results found for this basename: LIPASE, AMYLASE,  in the last 168 hours No results found for this basename: AMMONIA,  in the last 168 hours CBC:  Recent Labs Lab 06/30/12 1849  WBC 6.5  NEUTROABS 3.7  HGB 11.8*  HCT 33.8*  MCV 89.7  PLT 137*   Cardiac Enzymes:  Recent Labs Lab 06/30/12 1849  TROPONINI <0.30    BNP (last 3 results)  Recent Labs  06/30/12 1849  PROBNP 1794.0*   CBG: No results found for this basename: GLUCAP,  in the last 168 hours  Radiological Exams on Admission: Dg Chest 2 View  06/30/2012   *RADIOLOGY REPORT*  Clinical Data: Short of breath  CHEST - 2 VIEW  Comparison: 04/25/2012  Findings: Heart size and vascularity are normal.  Negative for pneumonia or effusion.  No mass lesion.  IMPRESSION: No active cardiopulmonary abnormality.   Original Report Authenticated By: Janeece Riggers, M.D.   Ct Angio Chest Pe W/cm &/or Wo Cm  06/30/2012   *RADIOLOGY REPORT*  Clinical Data: Shortness of breath.   CT ANGIOGRAPHY CHEST  Technique:  Multidetector CT imaging of the chest using the standard protocol during bolus administration of intravenous contrast. Multiplanar reconstructed images including MIPs were obtained and reviewed to evaluate the vascular anatomy.  Contrast: OMNIPAQUE IOHEXOL 350 MG/ML SOLN  Comparison: 11/15/2009  Findings: There are large bilateral pulmonary emboli and involving all lobes of both lungs.  This is most pronounced in the lower lobes, right greater than left.  And heart is normal size.  Aorta is normal caliber. No mediastinal, hilar, or axillary adenopathy. Visualized thyroid and chest wall soft tissues unremarkable.  On no confluent airspace opacities.  Minimal dependent and bibasilar atelectasis.  No effusions.  And Imaging into the upper abdomen shows no acute findings.  And  No acute bony abnormality.  Degenerative spurring anteriorly throughout the thoracic spine.  IMPRESSION: Large bilateral pulmonary emboli and centrally and extending into all lobes bilaterally.  Critical Value/emergent results were called by telephone at the time of interpretation on 06/30/2012 at 11:50 p.m. to Dr. Deretha Emory, who verbally acknowledged these results.   Original Report Authenticated By: Charlett Nose, M.D.    EKG: Independently reviewed. Normal sinus rhythm with T-wave inversions in lead V3 and V4.  Assessment/Plan Principal Problem:   Pulmonary embolism Active Problems:   DM (diabetes mellitus)   HTN (hypertension)   CAD in native artery   History of recurrent TIAs   Diabetes mellitus   1. Pulmonary embolism - unprovoked. Hemodynamically stable. Patient has been started on IV heparin which will be continued. If patient is stable change to oral anticoagulants. Cycle cardiac markers. Check Dopplers of the lower extremity. Patient eventually will need referral to hematologist. 2. CAD status post stenting - denies any chest pain. Check cardiac markers given the EKG changes.  Discuss with patient's cardiologist in a.m. With regarding to continuation of patient's Plavix and aspirin as patient will be started on oral anticoagulants. 3. Diabetes mellitus type 2 uncontrolled - check hemoglobin A1c. Closely follow CBGs. Patient's insulin dose at home as continue this time but will need adjustments. 4. Hypertension - continue home medications. We'll hold Lasix. 5. Anemia - patient states that he has had colonoscopy and EGD this June and was unremarkable as per the patient. Closely follow CBC. 6. Mild thrombocytopenia - probably secondary to PE. 7. History of TIAs.    Code Status: full code.  Family Communication: none.  Disposition Plan: admit to inpatient.    Avalina Benko N. [facility] Pager [phone].  If 7PM-7AM, please contact night-coverage [email or web address] Password [email or web address] 07/01/2012, 1:13 AM

## 2012-07-01 NOTE — ED Provider Notes (Signed)
History     CSN: [id number]  Arrival date & time 06/30/12  1706   First MD Initiated Contact with Patient 06/30/12 1723      Chief Complaint  Patient presents with  . Shortness of Breath    (Consider location/radiation/quality/duration/timing/severity/associated sxs/prior treatment) Patient is a 72 y.o. male presenting with shortness of breath. The history is provided by the patient and the EMS personnel.  Shortness of Breath Associated symptoms: no abdominal pain, no chest pain, no fever, no rash and no vomiting    patient referred in by [facility] physicians for complaint of shortness of breath some lightheadedness for 3 days. No chest pain. Patient did have a stent placed for coronary artery disease in February of 2010. Also followed by [facility]. Patient denies any leg swelling or leg discomfort. No abdominal pain no nausea no vomiting. Patient came in by EMS. Already was on 2 L of oxygen. Oxygen saturations on this was 98% or above.   Past Medical History  Diagnosis Date  . Diabetes mellitus   . Coronary artery disease   . Hypertension   . Stroke 2002, 2003  . History of TIAs     Past Surgical History  Procedure Laterality Date  . Hernia repair    . Carpal tunnel release      bilateral  . Elbow surgery      right elbow - nerve release  . Cervical disc surgery    . Lungs      "fluid pumped off lungs"  . Coronary stent placement  Feb. 2010  . Cardiac catheterization      History reviewed. No pertinent family history.  History  Substance Use Topics  . Smoking status: Never Smoker   . Smokeless tobacco: Never Used  . Alcohol Use: No      Review of Systems  Constitutional: Negative for fever.  HENT: Negative for congestion.   Respiratory: Positive for shortness of breath. Negative for chest tightness.   Cardiovascular: Negative for chest pain.  Gastrointestinal: Negative for nausea, vomiting and abdominal pain.  Genitourinary: Negative for dysuria.   Musculoskeletal: Negative for back pain.  Skin: Negative for rash.  Neurological: Negative for syncope.  Hematological: Does not bruise/bleed easily.  Psychiatric/Behavioral: Negative for confusion.    Allergies  Review of patient's allergies indicates no known allergies.  Home Medications   Current Outpatient Rx  Name  Route  Sig  Dispense  Refill  . aspirin 325 MG tablet   Oral   Take 325 mg by mouth daily.          . cholecalciferol (VITAMIN D) 1000 UNITS tablet   Oral   Take 1,000 Units by mouth daily.         . clopidogrel (PLAVIX) 75 MG tablet   Oral   Take 75 mg by mouth daily.         . diclofenac sodium (VOLTAREN) 1 % GEL   Topical   Apply 1 application topically 3 (three) times daily as needed (for pain in left arm and shoulder).         . DULoxetine (CYMBALTA) 60 MG capsule   Oral   Take 60 mg by mouth daily.         Marland Kitchen dutasteride (AVODART) 0.5 MG capsule   Oral   Take 0.5 mg by mouth daily.         . ferrous sulfate 325 (65 FE) MG tablet   Oral   Take 325 mg by mouth daily  with breakfast.         . furosemide (LASIX) 20 MG tablet   Oral   Take 20 mg by mouth daily as needed (swelling).          . insulin detemir (LEVEMIR) 100 UNIT/ML injection   Subcutaneous   Inject 18-25 Units into the skin daily. Based on sugar levels         . metFORMIN (GLUCOPHAGE) 500 MG tablet   Oral   Take 1,000 mg by mouth 2 (two) times daily with a meal.         . Multiple Vitamin (MULTIVITAMIN WITH MINERALS) TABS   Oral   Take 1 tablet by mouth every evening.          . pregabalin (LYRICA) 300 MG capsule   Oral   Take 300 mg by mouth 2 (two) times daily.         . ramipril (ALTACE) 2.5 MG capsule   Oral   Take 2.5 mg by mouth every evening.          . repaglinide (PRANDIN) 2 MG tablet   Oral   Take 2 mg by mouth 3 (three) times daily before meals.         . rosuvastatin (CRESTOR) 10 MG tablet   Oral   Take 10 mg by mouth every  evening.          . tapentadol (NUCYNTA) 50 MG TABS   Oral   Take 50 mg by mouth 3 (three) times daily as needed (for pain).         . valACYclovir (VALTREX) 500 MG tablet   Oral   Take 500 mg by mouth daily.           BP 123/78  Pulse 68  Temp(Src) 97.6 F (36.4 C) (Oral)  Resp 16  SpO2 100%  Physical Exam  Nursing note and vitals reviewed. Constitutional: He is oriented to person, place, and time. He appears well-developed and well-nourished. No distress.  HENT:  Head: Normocephalic and atraumatic.  Mouth/Throat: Oropharynx is clear and moist.  Eyes: Conjunctivae and EOM are normal. Pupils are equal, round, and reactive to light.  Neck: Normal range of motion.  Cardiovascular: Normal rate, regular rhythm, normal heart sounds and intact distal pulses.   No murmur heard. Pulmonary/Chest: Effort normal and breath sounds normal.  Abdominal: Soft. Bowel sounds are normal. There is no tenderness.  Musculoskeletal: Normal range of motion. He exhibits no edema and no tenderness.  Neurological: He is alert and oriented to person, place, and time. No cranial nerve deficit. He exhibits normal muscle tone. Coordination normal.  Skin: Skin is warm. No rash noted.    ED Course  Procedures (including critical care time)  Labs Reviewed  CBC WITH DIFFERENTIAL - Abnormal; Notable for the following:    RBC 3.77 (*)    Hemoglobin 11.8 (*)    HCT 33.8 (*)    Platelets 137 (*)    All other components within normal limits  BASIC METABOLIC PANEL - Abnormal; Notable for the following:    Glucose, Bld 336 (*)    GFR calc non Af Amer 71 (*)    GFR calc Af Amer 82 (*)    All other components within normal limits  PRO B NATRIURETIC PEPTIDE - Abnormal; Notable for the following:    Pro B Natriuretic peptide (BNP) 1794.0 (*)    All other components within normal limits  TROPONIN I  HEPARIN LEVEL (UNFRACTIONATED)  CBC  Dg Chest 2 View  06/30/2012   *RADIOLOGY REPORT*  Clinical  Data: Short of breath  CHEST - 2 VIEW  Comparison: 04/25/2012  Findings: Heart size and vascularity are normal.  Negative for pneumonia or effusion.  No mass lesion.  IMPRESSION: No active cardiopulmonary abnormality.   Original Report Authenticated By: Janeece Riggers, M.D.   Ct Angio Chest Pe W/cm &/or Wo Cm  06/30/2012   *RADIOLOGY REPORT*  Clinical Data: Shortness of breath.  CT ANGIOGRAPHY CHEST  Technique:  Multidetector CT imaging of the chest using the standard protocol during bolus administration of intravenous contrast. Multiplanar reconstructed images including MIPs were obtained and reviewed to evaluate the vascular anatomy.  Contrast: OMNIPAQUE IOHEXOL 350 MG/ML SOLN  Comparison: 11/15/2009  Findings: There are large bilateral pulmonary emboli and involving all lobes of both lungs.  This is most pronounced in the lower lobes, right greater than left.  And heart is normal size.  Aorta is normal caliber. No mediastinal, hilar, or axillary adenopathy. Visualized thyroid and chest wall soft tissues unremarkable.  On no confluent airspace opacities.  Minimal dependent and bibasilar atelectasis.  No effusions.  And Imaging into the upper abdomen shows no acute findings.  And  No acute bony abnormality.  Degenerative spurring anteriorly throughout the thoracic spine.  IMPRESSION: Large bilateral pulmonary emboli and centrally and extending into all lobes bilaterally.  Critical Value/emergent results were called by telephone at the time of interpretation on 06/30/2012 at 11:50 p.m. to Dr. Deretha Emory, who verbally acknowledged these results.   Original Report Authenticated By: Charlett Nose, M.D.     1. Pulmonary embolus     CRITICAL CARE Performed by: Shelda Jakes. Total critical care time: 30 Critical care time was exclusive of separately billable procedures and treating other patients. Critical care was necessary to treat or prevent imminent or life-threatening deterioration. Critical care  was time spent personally by me on the following activities: development of treatment plan with patient and/or surrogate as well as nursing, discussions with consultants, evaluation of patient's response to treatment, examination of patient, obtaining history from patient or surrogate, ordering and performing treatments and interventions, ordering and review of laboratory studies, ordering and review of radiographic studies, pulse oximetry and re-evaluation of patient's condition.   MDM  CT angios consistent with bilateral pulmonary embolus large in nature. Patient not hypoxic stable but will need admission will be started on heparin. Patient admitted to step down by hospitalist service patient primary care doctors [facility] physicians. Heparin bolus and heparin infusion ordered. CT findings explain patient's short of breath for the past 3 days. Never had any chest pain despite having coronary artery disease history and a stent. Troponin was negative. Saturation oxygen 98% on 2 L. Patient was seen earlier [facility] physicians EKG had inverted T waves that were new referred in here for the shortness of breath patient never had chest pain.  After initial workup all been negative besides the CT angiogram clear had no explanation for the patient's shortness of breath the last consideration was for possible pulmonary embolus. Patient has no leg swelling.       Shelda Jakes, MD 07/01/12 [phone]

## 2012-07-01 NOTE — Progress Notes (Signed)
ANTICOAGULATION CONSULT NOTE - Initial Consult  Pharmacy Consult for heparin Indication: pulmonary embolus  No Known Allergies  Patient Measurements: Heparin Dosing Weight: 70kg  Vital Signs: Temp: 97.6 F (36.4 C) (06/22 2145) Temp src: Oral (06/22 2145) BP: 123/78 mmHg (06/23 0000) Pulse Rate: 68 (06/23 0000)  Labs:  Recent Labs  06/30/12 1849  HGB 11.8*  HCT 33.8*  PLT 137*  CREATININE 1.04  TROPONINI <0.30     Medical History: Past Medical History  Diagnosis Date  . Diabetes mellitus   . Coronary artery disease   . Hypertension   . Stroke 2002, 2003  . History of TIAs      Assessment: 72yo male c/o SOB SOB w/ exertion x3d, sent to ED from [facility] for inverted T waves on EKG, CT here reveals large bilateral PE, to begin heparin.  Goal of Therapy:  Heparin level 0.3-0.7 units/ml Monitor platelets by anticoagulation protocol: Yes   Plan:  Will give heparin 4500 units IV bolus x1 followed by gtt at 1100 units/hr and monitor heparin levels and CBC.  Vernard Gambles, PharmD, BCPS  07/01/2012,12:42 AM

## 2012-07-01 NOTE — Progress Notes (Signed)
INITIAL NUTRITION ASSESSMENT  DOCUMENTATION CODES Per approved criteria  -Not Applicable   INTERVENTION: 1. Glucerna Shake po BID, each supplement provides 220 kcal and 10 grams of protein.  NUTRITION DIAGNOSIS: Inadequate oral intake related to chronic illness as evidenced by wt loss.   Goal: Pt to meet >/= 90% of their estimated nutrition needs  Monitor:  Weight, po intake, labs  Reason for Assessment: MST  72 y.o. male  Admitting Dx: Pulmonary embolism  ASSESSMENT: Pt with known history of CAD status post stenting, previous history of TIA's, DM type 2 started experiencing shortness of breath 3 days PTA. Found to have pulmonary embolism and uncontrolled DM. HbA1C 8.5.   Pt reported that he has had a very good appetite and that he has been eating 100% of his meals. Confirmed by RN. He says that he weighed 180 lbs for an extended period of time, but that he joined a competition at the [facility] to see who could have the most weight loss. He said that he won, but that weight kept coming off after he got to his goal weight (170-175 lbs). He attributed his high blood glucose to being on steroids for an extended period of time. He is not happy at his current weight and is trying to gain while keeping his blood glucose under control. He agreed to try Glucerna shakes to supplement his diet.   Height: Ht Readings from Last 1 Encounters:  07/01/12 5\' 5"  (1.651 m)    Weight: Wt Readings from Last 1 Encounters:  07/01/12 153 lb 6.4 oz (69.582 kg)    Ideal Body Weight: 61.5 kg  % Ideal Body Weight: 100%  Wt Readings from Last 10 Encounters:  07/01/12 153 lb 6.4 oz (69.582 kg)  07/18/11 158 lb 14.4 oz (72.077 kg)    Usual Body Weight: 180 lbs  % Usual Body Weight: 85%, partly intentional weight loss  BMI:  Body mass index is 25.53 kg/(m^2).  Estimated Nutritional Needs: Kcal: 1700-2000 Protein: 70-85 g Fluid: 1.7-2.0 L  Skin: WNL  Diet Order: Carb Control  EDUCATION  NEEDS: -No education needs identified at this time   Intake/Output Summary (Last 24 hours) at 07/01/12 1043 Last data filed at 07/01/12 0817  Gross per 24 hour  Intake 655.55 ml  Output    625 ml  Net  30.55 ml    Last BM: 06/30/12   Labs:   Recent Labs Lab 06/30/12 1849 07/01/12 0714  NA 135 137  K 4.3 3.9  CL 101 103  CO2 26 26  BUN 21 14  CREATININE 1.04 0.93  CALCIUM 8.4 8.4  GLUCOSE 336* 125*    CBG (last 3)   Recent Labs  07/01/12 0623  GLUCAP 135*    Scheduled Meds: . atorvastatin  20 mg Oral q1800  . cholecalciferol  1,000 Units Oral Daily  . DULoxetine  60 mg Oral Daily  . dutasteride  0.5 mg Oral Daily  . ferrous sulfate  325 mg Oral Q breakfast  . insulin aspart  0-15 Units Subcutaneous TID WC  . insulin aspart  0-5 Units Subcutaneous QHS  . insulin aspart  4 Units Subcutaneous TID WC  . insulin detemir  30 Units Subcutaneous Daily  . multivitamin with minerals  1 tablet Oral QPM  . pregabalin  300 mg Oral BID  . repaglinide  2 mg Oral TID AC  . rivaroxaban  15 mg Oral BID WC   Followed by  . [START ON 07/22/2012] rivaroxaban  20  mg Oral Q supper  . sodium chloride  3 mL Intravenous Q12H  . valACYclovir  500 mg Oral Daily    Continuous Infusions:   Past Medical History  Diagnosis Date  . Diabetes mellitus   . Coronary artery disease   . Hypertension   . Stroke 2002, 2003  . History of TIAs     Past Surgical History  Procedure Laterality Date  . Hernia repair    . Carpal tunnel release      bilateral  . Elbow surgery      right elbow - nerve release  . Cervical disc surgery    . Lungs      "fluid pumped off lungs"  . Coronary stent placement  Feb. 2010  . Cardiac catheterization    . Coronary angioplasty      Steve Brown RD, LDN

## 2012-07-01 NOTE — Progress Notes (Addendum)
[facility]                                                                                Patient Demographics  Steve Brown, is a 72 y.o. male, DOB - 25-Feb-1940, ZOX:[id number], WJX:[id number]  Admit date - 06/30/2012  Admitting Physician [name], MD  Outpatient Primary MD for the patient is [facility], MD  LOS - 1   Chief Complaint  Patient presents with  . Shortness of Breath        Assessment & Plan    Pulmonary embolism - unprovoked. Hemodynamically stable. Patient has been started on IV heparin which will be switched to xaralto as he remains stable without any symptoms today , Negative cardiac markers. Check Dopplers of the lower extremity. Patient eventually will need referral to hematologist.     Mild Hematuria - This is chronic intermittent hematuria at the end of his urinary stream, patient told us today after he was seen around 1 PM, I discussed his case with urologist Dr. Margarita Grizzle who patient was supposed to see today as outpatient for this problem, his urologist recommends continuing anticoagulation without any changes, this likely is secondary to BPH which patient has history of, his urologist will see him as outpatient in a week no further workup recommended by him.     CAD status post stenting - denies any chest pain. Negative cardiac markers have some stable EKG changes. discussed with patient's cardiologist Dr. Eldridge Dace. No Plavix and aspirin as patient will be started on xaralto. In case he needs to go on Coumadin Plavix can be added.   Diabetes mellitus type 2 uncontrolled -  Closely follow CBGs. Increased liver metastases and sliding-scale insulin and added pre-meal NovoLog for better control.  Lab Results  Component Value Date   HGBA1C 8.5* 07/18/2011    CBG (last 3)   Recent Labs  07/01/12 0623  GLUCAP 135*       Hypertension - blood pressure is stable currently off of blood pressure medications.    Anemia - patient  states that he has had colonoscopy and EGD this June and was unremarkable as per the patient. Closely follow CBC.     Mild thrombocytopenia - probably secondary to PE. History of TIAs.     Code Status: Full  Family Communication: None present  Disposition Plan: Home   Procedures  CT angiogram chest, bilateral lower extremity venous duplex   Consults   Dr. Eldridge Dace over the phone   DVT Prophylaxis  xaralto - Heparin    Lab Results  Component Value Date   PLT 149* 07/01/2012    Medications  Scheduled Meds: . atorvastatin  20 mg Oral q1800  . cholecalciferol  1,000 Units Oral Daily  . DULoxetine  60 mg Oral Daily  . dutasteride  0.5 mg Oral Daily  . ferrous sulfate  325 mg Oral Q breakfast  . insulin aspart  0-15 Units Subcutaneous TID WC  . insulin aspart  0-5 Units Subcutaneous QHS  . insulin aspart  0-9 Units Subcutaneous TID WC  . insulin aspart  4 Units Subcutaneous TID WC  . insulin detemir  30 Units Subcutaneous Daily  . multivitamin with minerals  1 tablet Oral QPM  . pregabalin  300 mg Oral BID  . repaglinide  2 mg Oral TID AC  . sodium chloride  3 mL Intravenous Q12H  . valACYclovir  500 mg Oral Daily   Continuous Infusions: . heparin 1,100 Units/hr (07/01/12 0131)   PRN Meds:.acetaminophen, acetaminophen, diclofenac sodium, ondansetron (ZOFRAN) IV, ondansetron, tapentadol  Antibiotics    Anti-infectives   Start     Dose/Rate Route Frequency Ordered Stop   07/01/12 1000  valACYclovir (VALTREX) tablet 500 mg     500 mg Oral Daily 07/01/12 0234         Time Spent in minutes   35   Christoher Drudge K M.D on 07/01/2012 at 8:37 AM  Between 7am to 7pm - Pager - [phone]  After 7pm go to [email or web address] - password [email or web address]  And look for the night coverage person covering for me after hours  [facility] Office  [phone]    Subjective:   Steve Brown today has, No headache, No chest pain, No abdominal pain - No Nausea, No new  weakness tingling or numbness, No Cough - SOB.   Objective:   Filed Vitals:   07/01/12 0130 07/01/12 0145 07/01/12 0240 07/01/12 0613  BP: 107/71 107/69 135/82 102/52  Pulse: 64 64 68 65  Temp: 98.2 F (36.8 C)  97.4 F (36.3 C) 98.1 F (36.7 C)  TempSrc: Oral  Oral Oral  Resp:  17 18 18   Height:   5\' 5"  (1.651 m)   Weight:   69.582 kg (153 lb 6.4 oz)   SpO2: 100% 100% 98% 100%    Wt Readings from Last 3 Encounters:  07/01/12 69.582 kg (153 lb 6.4 oz)  07/18/11 72.077 kg (158 lb 14.4 oz)     Intake/Output Summary (Last 24 hours) at 07/01/12 0837 Last data filed at 07/01/12 0817  Gross per 24 hour  Intake 655.55 ml  Output    625 ml  Net  30.55 ml    Exam Awake Alert, Oriented X 3, No new F.N deficits, Normal affect [place].AT,PERRAL Supple Neck,No JVD, No cervical lymphadenopathy appriciated.  Symmetrical Chest wall movement, Good air movement bilaterally, CTAB RRR,No Gallops,Rubs or new Murmurs, No Parasternal Heave +ve B.Sounds, Abd Soft, Non tender, No organomegaly appriciated, No rebound - guarding or rigidity. No Cyanosis, Clubbing or edema, No new Rash or bruise    Data Review   Micro Results No results found for this or any previous visit (from the past 240 hour(s)).  Radiology Reports Dg Chest 2 View  06/30/2012   *RADIOLOGY REPORT*  Clinical Data: Short of breath  CHEST - 2 VIEW  Comparison: 04/25/2012  Findings: Heart size and vascularity are normal.  Negative for pneumonia or effusion.  No mass lesion.  IMPRESSION: No active cardiopulmonary abnormality.   Original Report Authenticated By: Janeece Riggers, M.D.   Ct Angio Chest Pe W/cm &/or Wo Cm  06/30/2012   *RADIOLOGY REPORT*  Clinical Data: Shortness of breath.  CT ANGIOGRAPHY CHEST  Technique:  Multidetector CT imaging of the chest using the standard protocol during bolus administration of intravenous contrast. Multiplanar reconstructed images including MIPs were obtained and reviewed to evaluate the  vascular anatomy.  Contrast: OMNIPAQUE IOHEXOL 350 MG/ML SOLN  Comparison: 11/15/2009  Findings: There are large bilateral pulmonary emboli and involving all lobes of both lungs.  This is most pronounced in the lower lobes, right greater than left.  And heart is normal size.  Aorta is normal caliber. No mediastinal,  hilar, or axillary adenopathy. Visualized thyroid and chest wall soft tissues unremarkable.  On no confluent airspace opacities.  Minimal dependent and bibasilar atelectasis.  No effusions.  And Imaging into the upper abdomen shows no acute findings.  And  No acute bony abnormality.  Degenerative spurring anteriorly throughout the thoracic spine.  IMPRESSION: Large bilateral pulmonary emboli and centrally and extending into all lobes bilaterally.  Critical Value/emergent results were called by telephone at the time of interpretation on 06/30/2012 at 11:50 p.m. to Dr. Deretha Emory, who verbally acknowledged these results.   Original Report Authenticated By: Charlett Nose, M.D.    CBC  Recent Labs Lab 06/30/12 1849 07/01/12 0714  WBC 6.5 6.2  HGB 11.8* 12.0*  HCT 33.8* 34.9*  PLT 137* 149*  MCV 89.7 90.6  MCH 31.3 31.2  MCHC 34.9 34.4  RDW 13.2 13.3  LYMPHSABS 2.2 2.7  MONOABS 0.6 0.5  EOSABS 0.0 0.0  BASOSABS 0.0 0.0    Chemistries   Recent Labs Lab 06/30/12 1849  NA 135  K 4.3  CL 101  CO2 26  GLUCOSE 336*  BUN 21  CREATININE 1.04  CALCIUM 8.4   ------------------------------------------------------------------------------------------------------------------ estimated creatinine clearance is 57.5 ml/min (by C-G formula based on Cr of 1.04). ------------------------------------------------------------------------------------------------------------------ No results found for this basename: HGBA1C,  in the last 72 hours ------------------------------------------------------------------------------------------------------------------ No results found for this  basename: CHOL, HDL, LDLCALC, TRIG, CHOLHDL, LDLDIRECT,  in the last 72 hours ------------------------------------------------------------------------------------------------------------------ No results found for this basename: TSH, T4TOTAL, FREET3, T3FREE, THYROIDAB,  in the last 72 hours ------------------------------------------------------------------------------------------------------------------ No results found for this basename: VITAMINB12, FOLATE, FERRITIN, TIBC, IRON, RETICCTPCT,  in the last 72 hours  Coagulation profile No results found for this basename: INR, PROTIME,  in the last 168 hours  No results found for this basename: DDIMER,  in the last 72 hours  Cardiac Enzymes  Recent Labs Lab 06/30/12 1849 07/01/12 0255  TROPONINI <0.30 <0.30   ------------------------------------------------------------------------------------------------------------------ No components found with this basename: POCBNP,

## 2012-07-01 NOTE — Progress Notes (Signed)
UR COMPLETED  

## 2012-07-01 NOTE — Progress Notes (Signed)
*  PRELIMINARY RESULTS* Vascular Ultrasound Lower extremity venous duplex has been completed.  Preliminary findings: negative for DVT.    Farrel Demark, RDMS, RVT  07/01/2012, 10:00 AM

## 2012-07-01 NOTE — Progress Notes (Addendum)
ANTICOAGULATION CONSULT NOTE - Initial Consult  Pharmacy Consult for Xarelto Indication: pulmonary embolus  No Known Allergies Patient Measurements: Height: 5\' 5"  (165.1 cm) Weight: 153 lb 6.4 oz (69.582 kg) (a scale) IBW/kg (Calculated) : 61.5 Vital Signs: Temp: 98.1 F (36.7 C) (06/23 0613) Temp src: Oral (06/23 0613) BP: 102/52 mmHg (06/23 0613) Pulse Rate: 65 (06/23 0613) Labs:  Recent Labs  06/30/12 1849 07/01/12 0255 07/01/12 0714  HGB 11.8*  --  12.0*  HCT 33.8*  --  34.9*  PLT 137*  --  149*  HEPARINUNFRC  --   --  0.95*  CREATININE 1.04  --  0.93  TROPONINI <0.30 <0.30  --    Estimated Creatinine Clearance: 64.3 ml/min (by C-G formula based on Cr of 0.93).  Assessment: 72 YO male admitted with large bilateral PE on IV heparin. First heparin level is supra-therapeutic at 0.95. Patient is stable today and cardiac markers are negative. MD switching from IV heparin to Xarelto. SCr is stable at 0.93/estimated CrCl~64 mL/min. H/H is stable if not improved. Platelets are stable at 149. No bleeding is reported.   Goal of Therapy:  Monitor platelets by anticoagulation protocol: Yes   Plan:  1. Stop heparin drip and heparin levels.  2. Start Xarelto 15mg  po BID with food x 3 weeks, then decrease to 20mg  daily with food. - 1st dose at the time of heparin discontinuation.  3. Follow-up LE Dopplers.  4. Monitor for signs and symptoms of bleeding.   Link Snuffer, PharmD, BCPS Clinical Pharmacist [phone] 07/01/2012,8:49 AM    ADDENDUM: CHANGE XARELTO BACK TO HEPARIN 1:59 PM Patient developing hematuria. Dr. Thedore Mins wants to change patient back to heparin and target a low heparin goal of 0.3 to 0.5. Patient has already received an AM dose of Xarelto at 09:32 AM. Due to half-life of this medication, must wait 24 hours before resuming heparin. Note that patient had elevated HL after a large bolus of 4500 units and rate of 1100 units/hr and dose needed to be decreased. The  level likely reflected the bolus.   Heparin dosing weight = 69.5 kg.  Plan: 1. Resume heparin on 07/02/12 at 09:30 AM at a lower rate of 1000 units/hr without a bolus due to hematuria and recent Xarelto dose.  2. Heparin level and CBC daily.   Link Snuffer, PharmD, BCPS Clinical Pharmacist [phone] 07/01/2012, 2:08 PM

## 2012-07-01 NOTE — Care Management Note (Addendum)
  Page 1 of 1   07/01/2012     1:29:27 PM   CARE MANAGEMENT NOTE 07/01/2012  Patient:  Steve Brown, Steve Brown   Account Number:  [id number]  Date Initiated:  07/01/2012  Documentation initiated by:  [facility]  Subjective/Objective Assessment:   72 y.o. male with known history of CAD status post stenting, previous history of TIAs, diabetes mellitus type 2, anemia started experiencing shortness of breath over the last 3 days.     Action/Plan:   Anticoagulants/ Benefits check for Xaralto   Anticipated DC Date:  07/02/2012   Anticipated DC Plan:  HOME/SELF CARE      DC Planning Services  CM consult      Choice offered to / List presented to:             Status of service:   Medicare Important Message given?   (If response is "NO", the following Medicare IM given date fields will be blank) Date Medicare IM given:   Date Additional Medicare IM given:    Discharge Disposition:    Per UR Regulation:    If discussed at Long Length of Stay Meetings, dates discussed:    Comments:  07/01/12 @ 1315.Marland KitchenMarland KitchenXaralto benefits check = $40.15 fo15mg  BID for 21 days and $55.00/90day fo20mg  QD.  CM gave pt $10 prescription refill card. Tamla Winkels J. Lucretia Roers, RN, BSN, [place] [phone].  07/01/12 @ 0900.Marland KitchenMarland KitchenOletta Cohn, RN, BSN, NCM CM to complete benefits check for Xaralto 15mg  BID for 21days; 20mg  QD for 9months.  Will notify MD and pt when results are back.

## 2012-07-01 NOTE — Progress Notes (Signed)
Patient arrived via stretcher from ED.  Patient alert and oriented x4.  Patient resting comfortably in no acute distress. RN will continue to monitor. Louretta Parma, RN

## 2012-07-02 DIAGNOSIS — I2699 Other pulmonary embolism without acute cor pulmonale: Secondary | ICD-10-CM | POA: Diagnosis not present

## 2012-07-02 LAB — CBC
HCT: 35.7 % — ABNORMAL LOW (ref 39.0–52.0)
Hemoglobin: 12.2 g/dL — ABNORMAL LOW (ref 13.0–17.0)
MCH: 30.9 pg (ref 26.0–34.0)
MCHC: 34.2 g/dL (ref 30.0–36.0)
MCV: 90.4 fL (ref 78.0–100.0)
Platelets: 145 10*3/uL — ABNORMAL LOW (ref 150–400)
RBC: 3.95 MIL/uL — ABNORMAL LOW (ref 4.22–5.81)
RDW: 13.2 % (ref 11.5–15.5)
WBC: 6 10*3/uL (ref 4.0–10.5)

## 2012-07-02 LAB — GLUCOSE, CAPILLARY: Glucose-Capillary: 170 mg/dL — ABNORMAL HIGH (ref 70–99)

## 2012-07-02 MED ORDER — RIVAROXABAN 15 MG PO TABS
15.0000 mg | ORAL_TABLET | Freq: Two times a day (BID) | ORAL | Status: DC
  Administered 2012-07-02: 15 mg via ORAL
  Filled 2012-07-02 (×3): qty 1

## 2012-07-02 MED ORDER — RIVAROXABAN 15 MG PO TABS: 15.0000 mg | ORAL_TABLET | Freq: Two times a day (BID) | ORAL | Status: DC

## 2012-07-02 NOTE — Progress Notes (Addendum)
ANTICOAGULATION CONSULT NOTE - Initial Consult  Pharmacy Consult for Xarelto Indication: pulmonary embolus  No Known Allergies Patient Measurements: Height: 5\' 5"  (165.1 cm) Weight: 154 lb 6.4 oz (70.035 kg) (scale A) IBW/kg (Calculated) : 61.5 Vital Signs: Temp: 98.3 F (36.8 C) (06/24 0525) Temp src: Oral (06/24 0525) BP: 121/69 mmHg (06/24 0525) Pulse Rate: 63 (06/24 0525) Labs:  Recent Labs  06/30/12 1849 07/01/12 0255 07/01/12 0714 07/02/12 0453  HGB 11.8*  --  12.0* 12.2*  HCT 33.8*  --  34.9* 35.7*  PLT 137*  --  149* 145*  HEPARINUNFRC  --   --  0.95*  --   CREATININE 1.04  --  0.93  --   TROPONINI <0.30 <0.30  --   --    Estimated Creatinine Clearance: 64.3 ml/min (by C-G formula based on Cr of 0.93).  Assessment: 72 YO male admitted with large bilateral PE on IV heparin Patient is stable today.  MD switching from IV heparin to Xarelto after attempting yesterday. . SCr is stable at 0.93/estimated CrCl~64 mL/min. H/H is stable . Platelets are stable. No bleeding is reported.   Goal of Therapy:  Monitor platelets by anticoagulation protocol: Yes   Plan:  1. Stop heparin drip and heparin levels.  2. Start Xarelto 15mg  po BID with food x 3 weeks, then decrease to 20mg  daily with food. - 1st dose at the time of heparin discontinuation.  3. Follow-up LE Dopplers.  4. Monitor for signs and symptoms of bleeding.   Link Snuffer, PharmD, BCPS Clinical Pharmacist [phone] 07/02/2012,7:34 AM    ADDENDUM: CHANGE XARELTO BACK TO HEPARIN 7:34 AM Patient developing hematuria. Dr. Thedore Mins wants to change patient back to heparin and target a low heparin goal of 0.3 to 0.5. Patient has already received an AM dose of Xarelto at 09:32 AM. Due to half-life of this medication, must wait 24 hours before resuming heparin. Note that patient had elevated HL after a large bolus of 4500 units and rate of 1100 units/hr and dose needed to be decreased. The level likely reflected the  bolus.   Heparin dosing weight = 69.5 kg.  Plan: 1. Resume heparin on 07/02/12 at 09:30 AM at a lower rate of 1000 units/hr without a bolus due to hematuria and recent Xarelto dose.  2. Heparin level and CBC daily.   Janice Coffin

## 2012-07-02 NOTE — Discharge Summary (Signed)
[facility]                                                                                   Steve Brown, is a 72 y.o. male  DOB [birth year]  MRN [id number].  Admission date:  06/30/2012  Discharge Date:  07/02/2012  Primary MD  Miguel Aschoff, MD  Admitting Physician  [name], MD  Admission Diagnosis  HTN (hypertension) [401.9] Pulmonary embolus [415.19] CAD in native artery [414.01] Diabetes mellitus [250.00]  Discharge Diagnosis     Principal Problem:   Pulmonary embolism Active Problems:   DM (diabetes mellitus)   HTN (hypertension)   CAD in native artery   History of recurrent TIAs   Diabetes mellitus    Past Medical History  Diagnosis Date  . Diabetes mellitus   . Coronary artery disease   . Hypertension   . Stroke 2002, 2003  . History of TIAs     Past Surgical History  Procedure Laterality Date  . Hernia repair    . Carpal tunnel release      bilateral  . Elbow surgery      right elbow - nerve release  . Cervical disc surgery    . Lungs      "fluid pumped off lungs"  . Coronary stent placement  Feb. 2010  . Cardiac catheterization    . Coronary angioplasty       Recommendations for primary care physician for things to follow:       Discharge Diagnoses:   Principal Problem:   Pulmonary embolism Active Problems:   DM (diabetes mellitus)   HTN (hypertension)   CAD in native artery   History of recurrent TIAs   Diabetes mellitus    Discharge Condition: Stable   Diet recommendation: See Discharge Instructions below   Consults Dr. Margarita Grizzle urology and Dr. Eldridge Dace cardiology over the phone    History of present illness and  Hospital Course:     Kindly see H&P for history of present illness and admission details, please review complete Labs, Consult reports and Test reports for all details in brief Steve Brown, is a 72 y.o. male, patient with history of CAD status post stent in 2010, hypertension, type 2  diabetes mellitus, TIAs was admitted to the hospital with shortness of breath and chest pain and was found to have bilateral unprovoked PEs, he was initially kept on heparin drip and then transitioned to xaralto and will be discharged home after result of teaching. Patient is clinically stable completely symptom-free at this time. Lower committee Doppler is negative. He will followup with his PCP and outpatient with hematology for PE workup for hypercoagulable state.   Patient also had mild hematuria which is chronic and it typically happens after his urination has stopped, I discussed his case with his urologist Dr. Margarita Grizzle who suggested this is due to his history of BPH and that he should continue anticoagulation without any hesitation and follow with him in a week post discharge. His renal ultrasound showed renal cysts only and he will follow with Dr. Margarita Grizzle for that.   History of TIAs and CAD post stenting I discussed his case with Dr. Eldridge Dace  his cardiologist who recommends xaralto his aspirin and it should be stopped which have been done.  For his hypertension and diabetes mellitus type 2 which does not appear to be in good control his A1c was 8.5 his home medications will be continued we'll request PCP to continue monitoring his diabetes and hypertension and adjust medications as needed.    His chronic anemia and thrombocytopenia are stable we'll continue to follow with PCP for the same.     Today   Subjective:   Steve Brown today has no headache,no chest abdominal pain,no new weakness tingling or numbness, feels much better wants to go home today.   Objective:   Blood pressure 121/69, pulse 63, temperature 98.3 F (36.8 C), temperature source Oral, resp. rate 18, height 5\' 5"  (1.651 m), weight 70.035 kg (154 lb 6.4 oz), SpO2 99.00%.   Intake/Output Summary (Last 24 hours) at 07/02/12 0809 Last data filed at 07/02/12 0641  Gross per 24 hour  Intake   1160 ml  Output    1840 ml  Net   -680 ml    Exam Awake Alert, Oriented *3, No new F.N deficits, Normal affect [place].AT,PERRAL Supple Neck,No JVD, No cervical lymphadenopathy appriciated.  Symmetrical Chest wall movement, Good air movement bilaterally, CTAB RRR,No Gallops,Rubs or new Murmurs, No Parasternal Heave +ve B.Sounds, Abd Soft, Non tender, No organomegaly appriciated, No rebound -guarding or rigidity. No Cyanosis, Clubbing or edema, No new Rash or bruise  Data Review   Major procedures and Radiology Reports - PLEASE review detailed and final reports for all details in brief -   Vascular Ultrasound   Lower extremity venous duplex has been completed. Preliminary findings: negative for DVT.    Dg Chest 2 View  06/30/2012   *RADIOLOGY REPORT*  Clinical Data: Short of breath  CHEST - 2 VIEW  Comparison: 04/25/2012  Findings: Heart size and vascularity are normal.  Negative for pneumonia or effusion.  No mass lesion.  IMPRESSION: No active cardiopulmonary abnormality.   Original Report Authenticated By: Janeece Riggers, M.D.   Ct Angio Chest Pe W/cm &/or Wo Cm  06/30/2012   *RADIOLOGY REPORT*  Clinical Data: Shortness of breath.  CT ANGIOGRAPHY CHEST  Technique:  Multidetector CT imaging of the chest using the standard protocol during bolus administration of intravenous contrast. Multiplanar reconstructed images including MIPs were obtained and reviewed to evaluate the vascular anatomy.  Contrast: OMNIPAQUE IOHEXOL 350 MG/ML SOLN  Comparison: 11/15/2009  Findings: There are large bilateral pulmonary emboli and involving all lobes of both lungs.  This is most pronounced in the lower lobes, right greater than left.  And heart is normal size.  Aorta is normal caliber. No mediastinal, hilar, or axillary adenopathy. Visualized thyroid and chest wall soft tissues unremarkable.  On no confluent airspace opacities.  Minimal dependent and bibasilar atelectasis.  No effusions.  And Imaging into the upper abdomen  shows no acute findings.  And  No acute bony abnormality.  Degenerative spurring anteriorly throughout the thoracic spine.  IMPRESSION: Large bilateral pulmonary emboli and centrally and extending into all lobes bilaterally.  Critical Value/emergent results were called by telephone at the time of interpretation on 06/30/2012 at 11:50 p.m. to Dr. Deretha Emory, who verbally acknowledged these results.   Original Report Authenticated By: Charlett Nose, M.D.   US Renal  07/01/2012   *RADIOLOGY REPORT*  Clinical Data:  Hematuria  RENAL/URINARY TRACT ULTRASOUND COMPLETE  Comparison:  CT abdomen/pelvis 11/17/2010  Findings:  Right Kidney:  Normal in size and  parenchymal echogenicity.  No evidence of solid mass or hydronephrosis. Small 0.9 x 1.0 x 0.8 cm anechoic simple cyst in the lower pole. This cyst remains unchanged compared to the prior CT scan.  Left Kidney:  Normal in size and parenchymal echogenicity.  No evidence of solid mass or hydronephrosis. Small 0.9 x 0.6 x 0.8 cm anechoic simple cyst in the interpolar left kidney. This remains essentially unchanged compared to the prior CT scan.  Bladder:  Appears normal for degree of bladder distention.  IMPRESSION:  1.  No acute abnormality. 2.  Stable small simple renal cysts bilaterally.   Original Report Authenticated By: Malachy Moan, M.D.    Micro Results      No results found for this or any previous visit (from the past 240 hour(s)).   CBC w Diff: Lab Results  Component Value Date   WBC 6.0 07/02/2012   HGB 12.2* 07/02/2012   HCT 35.7* 07/02/2012   PLT 145* 07/02/2012   LYMPHOPCT 44 07/01/2012   MONOPCT 9 07/01/2012   EOSPCT 0 07/01/2012   BASOPCT 0 07/01/2012    CMP: Lab Results  Component Value Date   NA 137 07/01/2012   K 3.9 07/01/2012   CL 103 07/01/2012   CO2 26 07/01/2012   BUN 14 07/01/2012   CREATININE 0.93 07/01/2012   PROT 5.9* 07/01/2012   ALBUMIN 2.9* 07/01/2012   BILITOT 0.4 07/01/2012   ALKPHOS 68 07/01/2012   AST 22 07/01/2012    ALT 29 07/01/2012  .   Discharge Instructions     Follow with Primary MD Miguel Aschoff, MD in 4 days   Get CBC, CMP, checked 4 days by Primary MD and again as instructed by your Primary MD..  Get Medicines reviewed and adjusted.  Please request your Prim.MD to go over all Hospital Tests and Procedure/Radiological results at the follow up, please get all Hospital records sent to your Prim MD by signing hospital release before you go home.  Activity: As tolerated with Full fall precautions use walker/cane & assistance as needed   Diet:   Heart healthy low carbohydrate  For Heart failure patients - Check your Weight same time everyday, if you gain over 2 pounds, or you develop in leg swelling, experience more shortness of breath or chest pain, call your Primary MD immediately. Follow Cardiac Low Salt Diet and 1.8 lit/day fluid restriction.  Disposition Home    If you experience worsening of your admission symptoms, develop shortness of breath, life threatening emergency, suicidal or homicidal thoughts you must seek medical attention immediately by calling 911 or calling your MD immediately  if symptoms less severe.  You Must read complete instructions/literature along with all the possible adverse reactions/side effects for all the Medicines you take and that have been prescribed to you. Take any new Medicines after you have completely understood and accpet all the possible adverse reactions/side effects.   Do not drive and provide baby sitting services if your were admitted for syncope or siezures until you have seen by Primary MD or a Neurologist and advised to do so again.  Do not drive when taking Pain medications.    Do not take more than prescribed Pain, Sleep and Anxiety Medications  Special Instructions: If you have smoked or chewed Tobacco  in the last 2 yrs please stop smoking, stop any regular Alcohol  and or any Recreational drug use.  Wear Seat belts while  driving.   Please note  You were cared for by a  hospitalist during your hospital stay. If you have any questions about your discharge medications or the care you received while you were in the hospital after you are discharged, you can call the unit and asked to speak with the hospitalist on call if the hospitalist that took care of you is not available. Once you are discharged, your primary care physician will handle any further medical issues. Please note that NO REFILLS for any discharge medications will be authorized once you are discharged, as it is imperative that you return to your primary care physician (or establish a relationship with a primary care physician if you do not have one) for your aftercare needs so that they can reassess your need for medications and monitor your lab values.   Follow-up Information   Follow up with Milford Cage, MD On 07/09/2012. (8:15 am)    Contact information:   [address] [place] [address] [place] [address] [phone]       Follow up with [facility], MD. Schedule an appointment as soon as possible for a visit in 4 days.   Contact information:   [address] [place] [address] [phone]       Follow up with Levert Feinstein, MD. Schedule an appointment as soon as possible for a visit in 1 month.   Contact information:   [address] [place] [address] [phone]         Discharge Medications     Medication List    STOP taking these medications       aspirin 325 MG tablet     clopidogrel 75 MG tablet  Commonly known as:  PLAVIX      TAKE these medications       cholecalciferol 1000 UNITS tablet  Commonly known as:  VITAMIN D  Take 1,000 Units by mouth daily.     diclofenac sodium 1 % Gel  Commonly known as:  VOLTAREN  Apply 1 application topically 3 (three) times daily as needed (for pain in left arm and shoulder).     DULoxetine 60 MG capsule   Commonly known as:  CYMBALTA  Take 60 mg by mouth daily.     dutasteride 0.5 MG capsule  Commonly known as:  AVODART  Take 0.5 mg by mouth daily.     ferrous sulfate 325 (65 FE) MG tablet  Take 325 mg by mouth daily with breakfast.     furosemide 20 MG tablet  Commonly known as:  LASIX  Take 20 mg by mouth daily as needed (swelling).     insulin detemir 100 UNIT/ML injection  Commonly known as:  LEVEMIR  Inject 18-25 Units into the skin daily. Based on sugar levels     metFORMIN 500 MG tablet  Commonly known as:  GLUCOPHAGE  Take 1,000 mg by mouth 2 (two) times daily with a meal.     multivitamin with minerals Tabs  Take 1 tablet by mouth every evening.     pregabalin 300 MG capsule  Commonly known as:  LYRICA  Take 300 mg by mouth 2 (two) times daily.     ramipril 2.5 MG capsule  Commonly known as:  ALTACE  Take 2.5 mg by mouth every evening.     repaglinide 2 MG tablet  Commonly known as:  PRANDIN  Take 2 mg by mouth 3 (three) times daily before meals.     Rivaroxaban 15 MG Tabs tablet  Commonly known as:  XARELTO  Take 1 tablet (15 mg total) by mouth 2 (two) times daily. Dispense this dose for 21 days, thereafter her primary care physician.     rosuvastatin 10 MG tablet  Commonly known as:  CRESTOR  Take 10 mg by mouth every evening.     tapentadol 50 MG Tabs  Commonly known as:  NUCYNTA  Take 50 mg by mouth 3 (three) times daily as needed (for pain).     valACYclovir 500 MG tablet  Commonly known as:  VALTREX  Take 500 mg by mouth daily.           Total Time in preparing paper work, data evaluation and todays exam - 35 minutes  Leroy Sea M.D on 07/02/2012 at 8:09 AM  [facility] Office  [phone]

## 2012-07-02 NOTE — Progress Notes (Signed)
DC IV, DC Tele, DC Home. Discharge instructions and home medications discussed with patient and patient's daughter. Patient and family denied any questions or concerns at this time. Patient leaving unit via wheelchair and appears in no acute distress.

## 2012-07-09 DIAGNOSIS — N400 Enlarged prostate without lower urinary tract symptoms: Secondary | ICD-10-CM | POA: Diagnosis not present

## 2012-07-09 DIAGNOSIS — R351 Nocturia: Secondary | ICD-10-CM | POA: Diagnosis not present

## 2012-07-09 DIAGNOSIS — E1059 Type 1 diabetes mellitus with other circulatory complications: Secondary | ICD-10-CM | POA: Diagnosis not present

## 2012-07-09 DIAGNOSIS — L608 Other nail disorders: Secondary | ICD-10-CM | POA: Diagnosis not present

## 2012-07-09 DIAGNOSIS — M79609 Pain in unspecified limb: Secondary | ICD-10-CM | POA: Diagnosis not present

## 2012-07-09 DIAGNOSIS — G894 Chronic pain syndrome: Secondary | ICD-10-CM | POA: Diagnosis not present

## 2012-07-09 DIAGNOSIS — IMO0002 Reserved for concepts with insufficient information to code with codable children: Secondary | ICD-10-CM | POA: Diagnosis not present

## 2012-07-09 DIAGNOSIS — M5137 Other intervertebral disc degeneration, lumbosacral region: Secondary | ICD-10-CM | POA: Diagnosis not present

## 2012-07-09 DIAGNOSIS — I739 Peripheral vascular disease, unspecified: Secondary | ICD-10-CM | POA: Diagnosis not present

## 2012-07-09 DIAGNOSIS — R310 Gross hematuria: Secondary | ICD-10-CM | POA: Diagnosis not present

## 2012-07-15 DIAGNOSIS — IMO0001 Reserved for inherently not codable concepts without codable children: Secondary | ICD-10-CM | POA: Diagnosis not present

## 2012-07-15 DIAGNOSIS — I2510 Atherosclerotic heart disease of native coronary artery without angina pectoris: Secondary | ICD-10-CM | POA: Diagnosis not present

## 2012-07-15 DIAGNOSIS — I2699 Other pulmonary embolism without acute cor pulmonale: Secondary | ICD-10-CM | POA: Diagnosis not present

## 2012-07-15 DIAGNOSIS — T81718A Complication of other artery following a procedure, not elsewhere classified, initial encounter: Secondary | ICD-10-CM | POA: Diagnosis not present

## 2012-07-15 DIAGNOSIS — I10 Essential (primary) hypertension: Secondary | ICD-10-CM | POA: Diagnosis not present

## 2012-07-18 ENCOUNTER — Other Ambulatory Visit (HOSPITAL_COMMUNITY): Payer: Self-pay | Admitting: Internal Medicine

## 2012-07-21 ENCOUNTER — Other Ambulatory Visit: Payer: Self-pay | Admitting: Neurology

## 2012-07-23 ENCOUNTER — Telehealth: Payer: Self-pay | Admitting: Oncology

## 2012-07-23 NOTE — Telephone Encounter (Signed)
S/W PT IN REF TO NP APPT. ON 08/14/12@3 :00 REFERRING DR [facility] DX-PULMONARY EMBOLUS MAILED NP PACKET

## 2012-07-24 ENCOUNTER — Other Ambulatory Visit: Payer: Self-pay | Admitting: Neurology

## 2012-07-24 ENCOUNTER — Telehealth: Payer: Self-pay | Admitting: Neurology

## 2012-07-24 NOTE — Telephone Encounter (Signed)
Sent message to scheduler to contact patient for annual appt.

## 2012-07-25 ENCOUNTER — Telehealth: Payer: Self-pay | Admitting: Oncology

## 2012-07-25 NOTE — Telephone Encounter (Signed)
C/D 07/25/12 for appt. 08/14/12

## 2012-07-26 ENCOUNTER — Encounter: Payer: Self-pay | Admitting: Neurology

## 2012-07-26 ENCOUNTER — Ambulatory Visit (INDEPENDENT_AMBULATORY_CARE_PROVIDER_SITE_OTHER): Payer: Medicare Other | Admitting: Neurology

## 2012-07-26 VITALS — BP 121/77 | HR 66 | Resp 16 | Ht 65.0 in | Wt 156.0 lb

## 2012-07-26 DIAGNOSIS — E785 Hyperlipidemia, unspecified: Secondary | ICD-10-CM

## 2012-07-26 DIAGNOSIS — IMO0002 Reserved for concepts with insufficient information to code with codable children: Secondary | ICD-10-CM | POA: Diagnosis not present

## 2012-07-26 DIAGNOSIS — M2550 Pain in unspecified joint: Secondary | ICD-10-CM

## 2012-07-26 DIAGNOSIS — M5137 Other intervertebral disc degeneration, lumbosacral region: Secondary | ICD-10-CM | POA: Diagnosis not present

## 2012-07-26 DIAGNOSIS — I10 Essential (primary) hypertension: Secondary | ICD-10-CM

## 2012-07-26 DIAGNOSIS — G4710 Hypersomnia, unspecified: Secondary | ICD-10-CM

## 2012-07-26 DIAGNOSIS — R296 Repeated falls: Secondary | ICD-10-CM | POA: Insufficient documentation

## 2012-07-26 DIAGNOSIS — I6350 Cerebral infarction due to unspecified occlusion or stenosis of unspecified cerebral artery: Secondary | ICD-10-CM | POA: Diagnosis not present

## 2012-07-26 DIAGNOSIS — E1149 Type 2 diabetes mellitus with other diabetic neurological complication: Secondary | ICD-10-CM | POA: Insufficient documentation

## 2012-07-26 DIAGNOSIS — M5030 Other cervical disc degeneration, unspecified cervical region: Secondary | ICD-10-CM

## 2012-07-26 DIAGNOSIS — M792 Neuralgia and neuritis, unspecified: Secondary | ICD-10-CM

## 2012-07-26 DIAGNOSIS — M545 Low back pain, unspecified: Secondary | ICD-10-CM | POA: Diagnosis not present

## 2012-07-26 DIAGNOSIS — G5690 Unspecified mononeuropathy of unspecified upper limb: Secondary | ICD-10-CM

## 2012-07-26 DIAGNOSIS — I2510 Atherosclerotic heart disease of native coronary artery without angina pectoris: Secondary | ICD-10-CM

## 2012-07-26 DIAGNOSIS — Z8679 Personal history of other diseases of the circulatory system: Secondary | ICD-10-CM

## 2012-07-26 DIAGNOSIS — I639 Cerebral infarction, unspecified: Secondary | ICD-10-CM

## 2012-07-26 DIAGNOSIS — Z8673 Personal history of transient ischemic attack (TIA), and cerebral infarction without residual deficits: Secondary | ICD-10-CM | POA: Diagnosis not present

## 2012-07-26 DIAGNOSIS — Z79899 Other long term (current) drug therapy: Secondary | ICD-10-CM | POA: Diagnosis not present

## 2012-07-26 DIAGNOSIS — G894 Chronic pain syndrome: Secondary | ICD-10-CM | POA: Diagnosis not present

## 2012-07-26 DIAGNOSIS — E119 Type 2 diabetes mellitus without complications: Secondary | ICD-10-CM

## 2012-07-26 DIAGNOSIS — W19XXXA Unspecified fall, initial encounter: Secondary | ICD-10-CM

## 2012-07-26 DIAGNOSIS — G4730 Sleep apnea, unspecified: Secondary | ICD-10-CM

## 2012-07-26 MED ORDER — DICLOFENAC SODIUM 1 % TD GEL: 2.0000 g | Freq: Three times a day (TID) | TRANSDERMAL | Status: DC | PRN

## 2012-07-26 MED ORDER — PREGABALIN 300 MG PO CAPS: 300.0000 mg | ORAL_CAPSULE | Freq: Two times a day (BID) | ORAL | Status: DC

## 2012-07-26 NOTE — Progress Notes (Signed)
[facility]  Provider:  Dr Vickey Huger Referring Provider: Miguel Aschoff, MD Primary Care Physician:  Miguel Aschoff, MD  Chief Complaint  Patient presents with  . [facility] / Refill    # 10  Revisit    HPI:  Steve Brown is a 72 y.o. male here as a referral from Dr. Tenny Craw for cerebrovascular disease. Steve Brown is a  72 year old right-handed African American gentleman, seen here today for a routine revisit. He had 2 strokes  , one in each hemisphere , embolic strokes.   He is followed by a Dr. Wayland Denis the since summer 2011 for  pain management especially of back pain. But he has experienced other sources of discomfort besides his established back pain diagnosis,  he had to reported that coughing or sneezing  not affect  his back pain and he denies any numbness or change in bowel or bladder continence.  One of his main complaints was a sharp pain arising from the middle to lower back worse on the right side that radiates into the upper right buttock and sometimes down the anterior leg. He has been seeing Dr. Lucille Passy and Dr. Eldridge Dace have answered his cardiology questions.  He has continued to use anticoagulation and is now on a Coumadin replacement Xeralto.   He patient was originally referred in 2011 for sleep apnea the patient has reported having a tendency to have nightmares but every night but frequently visit dreams and felt that once he used CPAP also become more frequent. They may offer all reflexes ability to enter REM sleep stages via apnea is treated I have and 55-1/2-year-old daughter download from the CPAP machine to review here from generally to thousand 11 at that time the patient was on 11 cm water and had a residual AHI of 1.1 but his compliance was spotty. Today's Epworth sleepiness score is 9 points the fatigue severity score is 40 pints the geriatric depression score is 5 points and his fall risk assessment is 9 points.  He did discontinue the CPAP use- i will not  longer follow him  at the  sleep clinic. His pain management needs to be centered with pain clinic.  He continued to go to the [facility] 4 days per week.  He is active and his weight is stable.   The patient requests a refill of a topical anesthetic in topical form as well as Lyrica  refills.    Review of Systems: Out of a complete 14 system review, the patient complains of only the following symptoms, and all other reviewed systems are negative.  High fatigue, chronic pain.   History   Social History  . Marital Status: Divorced    Spouse Name: N/A    Number of Children: 4  . Years of Education: 12   Occupational History  .      retired   Social History Main Topics  . Smoking status: Never Smoker   . Smokeless tobacco: Never Used  . Alcohol Use: No  . Drug Use: No  . Sexually Active: Not on file   Other Topics Concern  . Not on file   Social History Narrative   Patient lives at home with  his daughter and he is single.  Patient is retired.    Caffeine - one cups daily.   Right handed.    Family History  Problem Relation Age of Onset  . Aneurysm Mother   . Cancer Father     Past Medical History  Diagnosis Date  .  Diabetes mellitus   . Coronary artery disease   . Hypertension   . Stroke 2002, 2003  . History of TIAs   . Lumbago   . Benign localized hyperplasia of prostate without urinary obstruction and other lower urinary tract symptoms (LUTS)   . Other and unspecified hyperlipidemia   . Unspecified essential hypertension   . Type II or unspecified type diabetes mellitus without mention of complication, not stated as uncontrolled   . Personal history of unspecified circulatory disease   . Unspecified fall   . Hypersomnia with sleep apnea, unspecified   . Pain in joint, multiple sites   . Degeneration of cervical intervertebral disc   . Unspecified cardiovascular disease     Past Surgical History  Procedure Laterality Date  . Hernia repair    . Carpal  tunnel release      bilateral  . Elbow surgery      right elbow - nerve release  . Cervical disc surgery    . Lungs      "fluid pumped off lungs"  . Coronary stent placement  Feb. 2010  . Cardiac catheterization    . Coronary angioplasty      Current Outpatient Prescriptions  Medication Sig Dispense Refill  . cholecalciferol (VITAMIN D) 1000 UNITS tablet Take 1,000 Units by mouth daily.      . diclofenac sodium (VOLTAREN) 1 % GEL Apply 1 application topically 3 (three) times daily as needed (for pain in left arm and shoulder).      . DULoxetine (CYMBALTA) 60 MG capsule Take 60 mg by mouth daily.      Marland Kitchen dutasteride (AVODART) 0.5 MG capsule Take 0.5 mg by mouth daily.      . ferrous sulfate 325 (65 FE) MG tablet Take 325 mg by mouth daily with breakfast.      . furosemide (LASIX) 20 MG tablet Take 20 mg by mouth daily as needed (swelling).       . insulin detemir (LEVEMIR) 100 UNIT/ML injection Inject 18-25 Units into the skin daily. Based on sugar levels      . LYRICA 300 MG capsule TAKE 1 CAPSULE BY MOUTH TWICE DAILY  180 capsule  0  . metFORMIN (GLUCOPHAGE) 500 MG tablet Take 1,000 mg by mouth 2 (two) times daily with a meal.      . Multiple Vitamin (MULTIVITAMIN WITH MINERALS) TABS Take 1 tablet by mouth every evening.       . ramipril (ALTACE) 2.5 MG capsule Take 2.5 mg by mouth every evening.       . repaglinide (PRANDIN) 2 MG tablet Take 2 mg by mouth 3 (three) times daily before meals.      . Rivaroxaban (XARELTO) 15 MG TABS tablet Take 1 tablet (15 mg total) by mouth 2 (two) times daily. Dispense this dose for 21 days, thereafter her primary care physician.  42 tablet  0  . rosuvastatin (CRESTOR) 10 MG tablet Take 10 mg by mouth every evening.       . tapentadol (NUCYNTA) 50 MG TABS Take 50 mg by mouth 3 (three) times daily as needed (for pain).      . valACYclovir (VALTREX) 500 MG tablet Take 500 mg by mouth daily.       No current facility-administered medications for this  visit.    Allergies as of 07/26/2012  . (No Known Allergies)    Vitals: BP 121/77  Pulse 66  Resp 16  Ht 5\' 5"  (1.651 m)  Wt 156  lb (70.761 kg)  BMI 25.96 kg/m2 Last Weight:  Wt Readings from Last 1 Encounters:  07/26/12 156 lb (70.761 kg)   Last Height:   Ht Readings from Last 1 Encounters:  07/26/12 5\' 5"  (1.651 m)     Physical exam:  General: The patient is awake, alert and appears not in acute distress. The patient is well groomed. Head: Normocephalic, atraumatic. Neck is supple. Mallampati3 neck circumference: 14.5 , retrognathia. Nasal septum is midline, nasal airflow unhindered.  Cardiovascular:  regular rate - rare palpiations, without  murmurs or carotid bruit, and without distended neck veins. Respiratory: Lungs are clear to auscultation. Skin:  Without evidence of edema, or rash Trunk: BMI is elevated - normal posture.  Neurologic exam : The patient is awake and alert, oriented to place and time.  Memory subjective  described as intact. There is a normal attention span & concentration ability. Speech is fluent without dysarthria, dysphonia or aphasia. Mood and affect are appropriate.  Cranial nerves: Pupils are equal and briskly reactive to light. Funduscopic exam without  evidence of pallor or edema. Extraocular movements  in vertical and horizontal planes intact and without nystagmus. Visual fields by finger perimetry are intact. Hearing to finger rub intact.  Facial sensation intact to fine touch. Facial motor strength is symmetric and tongue and uvula move midline.  Motor exam:  The patient reports pain radiating down the left upper extremity and his left hand is less sensitive to pinprick and fine touch . This started after the stroke on the right MCA.  He also has reported a patch over the left shoulder that is numb. He is able to perform rapid movements with both upper extremities and hands finger-to-nose and heel-to-shin. His gait is normal he does not need  an assistive device such as a cane or walker. His reflexes were symmetric. He has not reported any recent falls. This fall risk assessment was at 9 points, 5 from medications. 2 from age . Sensory:  Fine touch, pinprick and vibration were tested in all extremities. Proprioception  normal.  Coordination: Rapid alternating movements in the fingers/hands is tested and normal. Finger-to-nose maneuver tested and normal without evidence of ataxia, dysmetria or tremor.   Deep tendon reflexes: in the  upper and lower extremities are symmetric and intact. Babinski maneuver response is left upgoing .  Assessment:  After physical and neurologic examination, review of laboratory studies, imaging, neurophysiology testing and pre-existing records, assessment will be reviewed on the problem list.  Continued stroke prevention on XERALTO ,  continued neurologic  Pain treatment  on Lyrica and topical Voltaren . Patient remains with orthopedist and pain management .   Plan:  Treatment plan and additional workup will be reviewed under Problem List. Refills.

## 2012-07-26 NOTE — Patient Instructions (Signed)
Exercise to Lose Weight Exercise and a healthy diet may help you lose weight. Your doctor may suggest specific exercises. EXERCISE IDEAS AND TIPS  Choose low-cost things you enjoy doing, such as walking, bicycling, or exercising to workout videos.  Take stairs instead of the elevator.  Walk during your lunch break.  Park your car further away from work or school.  Go to a gym or an exercise class.  Start with 5 to 10 minutes of exercise each day. Build up to 30 minutes of exercise 4 to 6 days a week.  Wear shoes with good support and comfortable clothes.  Stretch before and after working out.  Work out until you breathe harder and your heart beats faster.  Drink extra water when you exercise.  Do not do so much that you hurt yourself, feel dizzy, or get very short of breath. Exercises that burn about 150 calories:  Running 1  miles in 15 minutes.  Playing volleyball for 45 to 60 minutes.  Washing and waxing a car for 45 to 60 minutes.  Playing touch football for 45 minutes.  Walking 1  miles in 35 minutes.  Pushing a stroller 1  miles in 30 minutes.  Playing basketball for 30 minutes.  Raking leaves for 30 minutes.  Bicycling 5 miles in 30 minutes.  Walking 2 miles in 30 minutes.  Dancing for 30 minutes.  Shoveling snow for 15 minutes.  Swimming laps for 20 minutes.  Walking up stairs for 15 minutes.  Bicycling 4 miles in 15 minutes.  Gardening for 30 to 45 minutes.  Jumping rope for 15 minutes.  Washing windows or floors for 45 to 60 minutes. Document Released: 01/28/2010 Document Revised: 03/20/2011 Document Reviewed: 01/28/2010 [facility] Patient Information 2014 [place], [place]. Neuropathic Pain We often think that pain has a physical cause. If we get rid of the cause, the pain should go away. Nerves themselves can also cause pain. It is called neuropathic pain, which means nerve abnormality. It may be difficult for the patients who have it and  for the treating caregivers. Pain is usually described as acute (short-lived) or chronic (long-lasting). Acute pain is related to the physical sensations caused by an injury. It can last from a few seconds to many weeks, but it usually goes away when normal healing occurs. Chronic pain lasts beyond the typical healing time. With neuropathic pain, the nerve fibers themselves may be damaged or injured. They then send incorrect signals to other pain centers. The pain you feel is real, but the cause is not easy to find.  CAUSES  Chronic pain can result from diseases, such as diabetes and shingles (an infection related to chickenpox), or from trauma, surgery, or amputation. It can also happen without any known injury or disease. The nerves are sending pain messages, even though there is no identifiable cause for such messages.   Other common causes of neuropathy include diabetes, phantom limb pain, or Regional Pain Syndrome (RPS).  As with all forms of chronic back pain, if neuropathy is not correctly treated, there can be a number of associated problems that lead to a downward cycle for the patient. These include depression, sleeplessness, feelings of fear and anxiety, limited social interaction and inability to do normal daily activities or work.  The most dramatic and mysterious example of neuropathic pain is called "phantom limb syndrome." This occurs when an arm or a leg has been removed because of illness or injury. The brain still gets pain messages from the  nerves that originally carried impulses from the missing limb. These nerves now seem to misfire and cause troubling pain.  Neuropathic pain often seems to have no cause. It responds poorly to standard pain treatment. Neuropathic pain can occur after:  Shingles (herpes zoster virus infection).  A lasting burning sensation of the skin, caused usually by injury to a peripheral nerve.  Peripheral neuropathy which is widespread nerve damage, often  caused by diabetes or alcoholism.  Phantom limb pain following an amputation.  Facial nerve problems (trigeminal neuralgia).  Multiple sclerosis.  Reflex sympathetic dystrophy.  Pain which comes with cancer and cancer chemotherapy.  Entrapment neuropathy such as when pressure is put on a nerve such as in carpal tunnel syndrome.  Back, leg, and hip problems (sciatica).  Spine or back surgery.  HIV Infection or AIDS where nerves are infected by viruses. Your caregiver can explain items in the above list which may apply to you. SYMPTOMS  Characteristics of neuropathic pain are:  Severe, sharp, electric shock-like, shooting, lightening-like, knife-like.  Pins and needles sensation.  Deep burning, deep cold, or deep ache.  Persistent numbness, tingling, or weakness.  Pain resulting from light touch or other stimulus that would not usually cause pain.  Increased sensitivity to something that would normally cause pain, such as a pinprick. Pain may persist for months or years following the healing of damaged tissues. When this happens, pain signals no longer sound an alarm about current injuries or injuries about to happen. Instead, the alarm system itself is not working correctly.  Neuropathic pain may get worse instead of better over time. For some people, it can lead to serious disability. It is important to be aware that severe injury in a limb can occur without a proper, protective pain response.Burns, cuts, and other injuries may go unnoticed. Without proper treatment, these injuries can become infected or lead to further disability. Take any injury seriously, and consult your caregiver for treatment. DIAGNOSIS  When you have a pain with no known cause, your caregiver will probably ask some specific questions:   Do you have any other conditions, such as diabetes, shingles, multiple sclerosis, or HIV infection?  How would you describe your pain? (Neuropathic pain is often  described as shooting, stabbing, burning, or searing.)  Is your pain worse at any time of the day? (Neuropathic pain is usually worse at night.)  Does the pain seem to follow a certain physical pathway?  Does the pain come from an area that has missing or injured nerves? (An example would be phantom limb pain.)  Is the pain triggered by minor things such as rubbing against the sheets at night? These questions often help define the type of pain involved. Once your caregiver knows what is happening, treatment can begin. Anticonvulsant, antidepressant drugs, and various pain relievers seem to work in some cases. If another condition, such as diabetes is involved, better management of that disorder may relieve the neuropathic pain.  TREATMENT  Neuropathic pain is frequently long-lasting and tends not to respond to treatment with narcotic type pain medication. It may respond well to other drugs such as antiseizure and antidepressant medications. Usually, neuropathic problems do not completely go away, but partial improvement is often possible with proper treatment. Your caregivers have large numbers of medications available to treat you. Do not be discouraged if you do not get immediate relief. Sometimes different medications or a combination of medications will be tried before you receive the results you are hoping for. See your  caregiver if you have pain that seems to be coming from nowhere and does not go away. Help is available.  SEEK IMMEDIATE MEDICAL CARE IF:   There is a sudden change in the quality of your pain, especially if the change is on only one side of the body.  You notice changes of the skin, such as redness, black or purple discoloration, swelling, or an ulcer.  You cannot move the affected limbs. Document Released: 09/23/2003 Document Revised: 03/20/2011 Document Reviewed: 09/23/2003 [facility] Patient Information 2014 [place], [place].

## 2012-07-28 ENCOUNTER — Other Ambulatory Visit: Payer: Self-pay | Admitting: Oncology

## 2012-07-28 DIAGNOSIS — D649 Anemia, unspecified: Secondary | ICD-10-CM

## 2012-07-28 DIAGNOSIS — I2699 Other pulmonary embolism without acute cor pulmonale: Secondary | ICD-10-CM

## 2012-07-29 ENCOUNTER — Telehealth: Payer: Self-pay | Admitting: Oncology

## 2012-07-29 NOTE — Telephone Encounter (Signed)
pof for 7/20 forwarded to HIM - new pt.

## 2012-08-14 ENCOUNTER — Encounter: Payer: Self-pay | Admitting: Oncology

## 2012-08-14 ENCOUNTER — Ambulatory Visit: Payer: Medicare Other

## 2012-08-14 ENCOUNTER — Other Ambulatory Visit (HOSPITAL_BASED_OUTPATIENT_CLINIC_OR_DEPARTMENT_OTHER): Payer: Medicare Other | Admitting: Lab

## 2012-08-14 ENCOUNTER — Telehealth: Payer: Self-pay | Admitting: Oncology

## 2012-08-14 ENCOUNTER — Ambulatory Visit (HOSPITAL_BASED_OUTPATIENT_CLINIC_OR_DEPARTMENT_OTHER): Payer: Medicare Other | Admitting: Oncology

## 2012-08-14 VITALS — BP 133/70 | HR 68 | Temp 97.9°F | Resp 18 | Ht 65.0 in | Wt 161.0 lb

## 2012-08-14 DIAGNOSIS — I2699 Other pulmonary embolism without acute cor pulmonale: Secondary | ICD-10-CM

## 2012-08-14 DIAGNOSIS — Z8673 Personal history of transient ischemic attack (TIA), and cerebral infarction without residual deficits: Secondary | ICD-10-CM | POA: Diagnosis not present

## 2012-08-14 DIAGNOSIS — D649 Anemia, unspecified: Secondary | ICD-10-CM

## 2012-08-14 DIAGNOSIS — I10 Essential (primary) hypertension: Secondary | ICD-10-CM

## 2012-08-14 DIAGNOSIS — E119 Type 2 diabetes mellitus without complications: Secondary | ICD-10-CM | POA: Diagnosis not present

## 2012-08-14 DIAGNOSIS — I2510 Atherosclerotic heart disease of native coronary artery without angina pectoris: Secondary | ICD-10-CM | POA: Diagnosis not present

## 2012-08-14 DIAGNOSIS — I6350 Cerebral infarction due to unspecified occlusion or stenosis of unspecified cerebral artery: Secondary | ICD-10-CM | POA: Diagnosis not present

## 2012-08-14 DIAGNOSIS — E785 Hyperlipidemia, unspecified: Secondary | ICD-10-CM | POA: Diagnosis not present

## 2012-08-14 LAB — CBC & DIFF AND RETIC
BASO%: 0 % (ref 0.0–2.0)
Basophils Absolute: 0 10*3/uL (ref 0.0–0.1)
EOS%: 0 % (ref 0.0–7.0)
Eosinophils Absolute: 0 10*3/uL (ref 0.0–0.5)
HCT: 38.8 % (ref 38.4–49.9)
HGB: 13.1 g/dL (ref 13.0–17.1)
Immature Retic Fract: 3.3 % (ref 3.00–10.60)
LYMPH%: 39 % (ref 14.0–49.0)
MCH: 31 pg (ref 27.2–33.4)
MCHC: 33.8 g/dL (ref 32.0–36.0)
MCV: 91.7 fL (ref 79.3–98.0)
MONO#: 0.4 10*3/uL (ref 0.1–0.9)
MONO%: 7.6 % (ref 0.0–14.0)
NEUT#: 2.9 10*3/uL (ref 1.5–6.5)
NEUT%: 53.4 % (ref 39.0–75.0)
Platelets: 204 10*3/uL (ref 140–400)
RBC: 4.23 10*6/uL (ref 4.20–5.82)
RDW: 13.3 % (ref 11.0–14.6)
Retic %: 1.01 % (ref 0.80–1.80)
Retic Ct Abs: 42.72 10*3/uL (ref 34.80–93.90)
WBC: 5.4 10*3/uL (ref 4.0–10.3)
lymph#: 2.1 10*3/uL (ref 0.9–3.3)
nRBC: 0 % (ref 0–0)

## 2012-08-14 LAB — COMPREHENSIVE METABOLIC PANEL (CC13)
ALT: 14 U/L (ref 0–55)
AST: 15 U/L (ref 5–34)
Albumin: 3.4 g/dL — ABNORMAL LOW (ref 3.5–5.0)
Alkaline Phosphatase: 92 U/L (ref 40–150)
BUN: 19 mg/dL (ref 7.0–26.0)
CO2: 27 mEq/L (ref 22–29)
Calcium: 9.3 mg/dL (ref 8.4–10.4)
Chloride: 104 mEq/L (ref 98–109)
Creatinine: 1.3 mg/dL (ref 0.7–1.3)
Glucose: 262 mg/dl — ABNORMAL HIGH (ref 70–140)
Potassium: 4.3 mEq/L (ref 3.5–5.1)
Sodium: 140 mEq/L (ref 136–145)
Total Bilirubin: 0.39 mg/dL (ref 0.20–1.20)
Total Protein: 6.9 g/dL (ref 6.4–8.3)

## 2012-08-14 LAB — CHCC SMEAR

## 2012-08-14 LAB — MORPHOLOGY: PLT EST: ADEQUATE

## 2012-08-14 LAB — LACTATE DEHYDROGENASE (CC13): LDH: 160 U/L (ref 125–245)

## 2012-08-14 NOTE — Progress Notes (Signed)
New Patient Hematology-Oncology Evaluation   Waco Foerster [id number] 1940/03/12 72 y.o. 08/14/2012  CC: Dr. Vianne Bulls; Dr Susa Raring   Reason for referral: Advice on duration of anticoagulation in a 72 year old man who recently had bilateral unprovoked pulmonary emboli   HPI:  Pleasant 72 year old man who looks much younger than his stated age. He has long-standing diabetes mellitus initially diagnosed in 1986 on insulin since 2008. He has hypertension. Coronary artery disease status post coronary stent. Cerebrovascular disease status post stroke in 2002 and a TIA in 2003. He went on a bus trip to [place] but states the bus stopped every few hours and the passengers got out and walked around. When he came home, he went on another trip. He drove to [facility]. About one week later he developed increasing dyspnea on minimal exertion just walking to his mailbox. He saw his primary care physician. Electrocardiogram was done and was abnormal and he was directed immediately to the [facility] emergency department where he was admitted on June 22. Cardiogram showed sinus rhythm with T wave inversions. However a CT angiogram of the chest showed large, bilateral, pulmonary emboli involving all lobes of both lungs. No pulmonary parenchymal mass, no mediastinal hilar or axillary adenopathy, thyroid appeared normal. Upper abdomen including the entire liver was normal. He was briefly anticoagulated with heparin and then put on Xarelto. He had venous Doppler studies of his lower extremities the next day which did not reveal a source for the clot. He gave a history of end of stream hematuria. History of a hydrocele. He underwent renal ultrasound on June 23 which was normal. No kidney masses. He has had a recent GU evaluation by Dr. Margarita Grizzle. He tells me that he had an upper endoscopy and a colonoscopy  just a few months ago by a Dr. Rhetta Mura in [place].  He has no constitutional symptoms.  No signs or symptoms of collagen vascular disorder. He is a never smoker.  There is no family history of blood clots in 5 brothers or 6 sisters. His father died of pancreatic cancer at age 58. His mother lived until age 57 and died of a ruptured cerebral aneurysm.   PMH: Past Medical History  Diagnosis Date  . Diabetes mellitus   . Coronary artery disease   . Hypertension   . Stroke 2002, 2003  . History of TIAs   . Lumbago   . Benign localized hyperplasia of prostate without urinary obstruction and other lower urinary tract symptoms (LUTS)   . Other and unspecified hyperlipidemia   . Unspecified essential hypertension   . Type II or unspecified type diabetes mellitus without mention of complication, not stated as uncontrolled   . Personal history of unspecified circulatory disease   . Unspecified fall   . Hypersomnia with sleep apnea, unspecified   . Pain in joint, multiple sites   . Degeneration of cervical intervertebral disc   . Unspecified cardiovascular disease   No history of hepatitis, yellow jaundice, thyroid disease, seizure, ulcers,  Past Surgical History  Procedure Laterality Date  . Hernia repair    . Carpal tunnel release      bilateral  . Elbow surgery      right elbow - nerve release  . Cervical disc surgery    . Lungs      "fluid pumped off lungs"  . Coronary stent placement  Feb. 2010  . Cardiac catheterization    . Coronary angioplasty      Allergies: No Known  Allergies  Medications: Xarelto 20 mg daily. Crestor 10 mg daily, Nucynta 50 mg 3 times a day when necessary pain, Valtrex 500 mg daily, Prandin 2 mg 3 times a day a.c., Altace 2.5 mg daily, Lyrica 300 mg twice a day, Glucophage 500 mg 2 tablets daily, Lasix 20 mg when necessary swelling, insulin 100 units per mL 18-25 units daily (levmir). Cymbalta 60 mg daily, Avodart 0.5 mg daily, Voltaren 1% gel when necessary arm and shoulder pain, vitamin D 1000 units daily, multivitamins one daily, ferrous  sulfate 325 mg daily.    Social History: Divorced. 4 healthy daughters. 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  he has never smoked. He drinks an occasional beer or glass of gin.  Family History: Family History  Problem Relation Age of Onset  . Aneurysm Mother   . Cancer Father     Review of Systems: Constitutional symptoms: See above HEENT: No sore throat Respiratory: Resolved dyspnea Cardiovascular:  No ischemic chest pain or palpitations Gastrointestinal ROS: No change in bowel habit Genito-Urinary ROS: See above  Hematological and Lymphatic: Musculoskeletal: Occasional arthritis pain Neurologic: He denies any paresthesias, no chronic headaches Dermatologic: No rash or ecchymosis Remaining ROS negative.  Physical Exam: Blood pressure 133/70, pulse 68, temperature 97.9 F (36.6 C), temperature source Oral, resp. rate 18, height 5\' 5"  (1.651 m), weight 161 lb (73.029 kg), SpO2 100.00%. Wt Readings from Last 3 Encounters:  08/14/12 161 lb (73.029 kg)  07/26/12 156 lb (70.761 kg)  07/02/12 154 lb 6.4 oz (70.035 kg)    General appearance: Well-nourished African American man HENNT: Pharynx no erythema or exudate Lymph nodes: No lymphadenopathy Breasts: Lungs: Clear to auscultation resonant to percussion Heart: Regular rhythm no murmur Vascular: No carotid bruits. Decreased right carotid pulse. No cyanosis. Abdominal: Soft, nontender, no mass, no organomegaly GU: Extremities: No edema, no calf tenderness Neurologic: Mental status intact, PERRLA, optic disc sharp on the right, not well visualized on the left, motor strength 5 over 5, reflexes 1+ symmetric, sensation intact to vibration over the fingertips Skin: No rash or ecchymosis    Lab Results: Lab Results  Component Value Date   WBC 5.4 08/14/2012   HGB 13.1 08/14/2012   HCT 38.8 08/14/2012   MCV 91.7 08/14/2012   PLT 204 08/14/2012     Chemistry      Component Value Date/Time   NA 140 08/14/2012 1513   NA 137  07/01/2012 0714   K 4.3 08/14/2012 1513   K 3.9 07/01/2012 0714   CL 103 07/01/2012 0714   CO2 27 08/14/2012 1513   CO2 26 07/01/2012 0714   BUN 19.0 08/14/2012 1513   BUN 14 07/01/2012 0714   CREATININE 1.3 08/14/2012 1513   CREATININE 0.93 07/01/2012 0714      Component Value Date/Time   CALCIUM 9.3 08/14/2012 1513   CALCIUM 8.4 07/01/2012 0714   ALKPHOS 92 08/14/2012 1513   ALKPHOS 68 07/01/2012 0714   AST 15 08/14/2012 1513   AST 22 07/01/2012 0714   ALT 14 08/14/2012 1513   ALT 29 07/01/2012 0714   BILITOT 0.39 08/14/2012 1513   BILITOT 0.4 07/01/2012 3086       Radiological Studies: See discussion above     Impression and Plan: Unprovoked bilateral pulmonary emboli in a 72 year old man with long-standing diabetes, hypertension, cardiovascular and cerebrovascular disease. Recent extensive evaluation shows no evidence for an occult malignancy.  I believe his clot is related to his age and native vessel disease. He  has a coronary stent. He has had a stroke and TIA. He has not had pulmonary emboli. I believe he should be maintained on chronic anticoagulation.  We discussed the risk versus benefit of Xarelto. The drug has now been on the market for about 3 years. We are not seeing an increase of bleeding complications. It is certainly more user friendly than Coumadin. We don't have data on long-term use of this drug yet. We discussed that we probably will not have a reversal agent for about another 2 years. He is doing well on the drug and I encouraged him to continue it. I would like to reevaluate him on an annual basis for any updates in the field or any modifications.     Levert Feinstein, MD 08/14/2012, 5:29 PM

## 2012-08-14 NOTE — Telephone Encounter (Signed)
gv pt appt schedule for August 2015.

## 2012-08-14 NOTE — Progress Notes (Signed)
Checked in new patient with financial issues. Mail and phone only - no email. Didn't ask if living will/POA

## 2012-08-15 ENCOUNTER — Telehealth: Payer: Self-pay | Admitting: *Deleted

## 2012-08-15 LAB — PROTHROMBIN GENE MUTATION

## 2012-08-15 NOTE — Telephone Encounter (Signed)
Message copied by Gala Romney on Thu Aug 15, 2012  3:06 PM ------      Message from: Levert Feinstein      Created: Thu Aug 15, 2012  2:19 PM       Call pt: all the special lab tests we did yesterday came back normal ------

## 2012-08-15 NOTE — Telephone Encounter (Signed)
Notified pt that special lab test done yesterday came back normal.  Pt verbalized understanding and expressed appreciation for call.

## 2012-08-16 LAB — LUPUS ANTICOAGULANT PANEL
DRVVT: 39.8 secs (ref <42.9)
Lupus Anticoagulant: NOT DETECTED
PTT Lupus Anticoagulant: 32.7 secs (ref 28.0–43.0)

## 2012-08-16 LAB — IMMUNOFIXATION ELECTROPHORESIS
IgA: 299 mg/dL (ref 68–379)
IgG (Immunoglobin G), Serum: 1160 mg/dL (ref 650–1600)
IgM, Serum: 40 mg/dL — ABNORMAL LOW (ref 41–251)
Total Protein, Serum Electrophoresis: 6.5 g/dL (ref 6.0–8.3)

## 2012-08-16 LAB — ANTITHROMBIN III: AntiThromb III Func: 110 % (ref 76–126)

## 2012-08-16 LAB — CARDIOLIPIN ANTIBODIES, IGG, IGM, IGA
Anticardiolipin IgA: 6 APL U/mL (ref <22)
Anticardiolipin IgG: 14 GPL U/mL (ref <23)
Anticardiolipin IgM: 0 MPL U/mL (ref <11)

## 2012-08-16 LAB — BETA-2 GLYCOPROTEIN ANTIBODIES
Beta-2 Glyco I IgG: 5 G Units (ref <20)
Beta-2-Glycoprotein I IgA: 0 A Units (ref <20)
Beta-2-Glycoprotein I IgM: 0 M Units (ref <20)

## 2012-08-16 LAB — D-DIMER, QUANTITATIVE: D-Dimer, Quant: 0.27 ug/mL-FEU (ref 0.00–0.48)

## 2012-08-16 LAB — FACTOR 5 LEIDEN

## 2012-08-20 ENCOUNTER — Inpatient Hospital Stay: Admit: 2012-08-20 | Payer: Self-pay | Admitting: Neurosurgery

## 2012-08-20 SURGERY — POSTERIOR LUMBAR FUSION 1 LEVEL
Anesthesia: General | Site: Back

## 2012-08-28 DIAGNOSIS — M5137 Other intervertebral disc degeneration, lumbosacral region: Secondary | ICD-10-CM | POA: Diagnosis not present

## 2012-08-28 DIAGNOSIS — IMO0001 Reserved for inherently not codable concepts without codable children: Secondary | ICD-10-CM | POA: Diagnosis not present

## 2012-08-28 DIAGNOSIS — G894 Chronic pain syndrome: Secondary | ICD-10-CM | POA: Diagnosis not present

## 2012-08-28 DIAGNOSIS — Z79899 Other long term (current) drug therapy: Secondary | ICD-10-CM | POA: Diagnosis not present

## 2012-08-29 DIAGNOSIS — M47817 Spondylosis without myelopathy or radiculopathy, lumbosacral region: Secondary | ICD-10-CM | POA: Diagnosis not present

## 2012-09-24 DIAGNOSIS — M5030 Other cervical disc degeneration, unspecified cervical region: Secondary | ICD-10-CM | POA: Diagnosis not present

## 2012-09-24 DIAGNOSIS — G894 Chronic pain syndrome: Secondary | ICD-10-CM | POA: Diagnosis not present

## 2012-09-24 DIAGNOSIS — M5137 Other intervertebral disc degeneration, lumbosacral region: Secondary | ICD-10-CM | POA: Diagnosis not present

## 2012-09-24 DIAGNOSIS — Z79899 Other long term (current) drug therapy: Secondary | ICD-10-CM | POA: Diagnosis not present

## 2012-09-24 DIAGNOSIS — M19019 Primary osteoarthritis, unspecified shoulder: Secondary | ICD-10-CM | POA: Diagnosis not present

## 2012-10-08 ENCOUNTER — Emergency Department (HOSPITAL_COMMUNITY): Payer: Medicare Other

## 2012-10-08 ENCOUNTER — Observation Stay (HOSPITAL_COMMUNITY)
Admission: EM | Admit: 2012-10-08 | Discharge: 2012-10-09 | Disposition: A | Discharge status: Home / Self Care | Payer: Medicare Other | Attending: Internal Medicine | Admitting: Internal Medicine

## 2012-10-08 DIAGNOSIS — Z7901 Long term (current) use of anticoagulants: Secondary | ICD-10-CM | POA: Insufficient documentation

## 2012-10-08 DIAGNOSIS — Z9861 Coronary angioplasty status: Secondary | ICD-10-CM | POA: Insufficient documentation

## 2012-10-08 DIAGNOSIS — Z79899 Other long term (current) drug therapy: Secondary | ICD-10-CM | POA: Insufficient documentation

## 2012-10-08 DIAGNOSIS — R0989 Other specified symptoms and signs involving the circulatory and respiratory systems: Secondary | ICD-10-CM | POA: Diagnosis not present

## 2012-10-08 DIAGNOSIS — I10 Essential (primary) hypertension: Secondary | ICD-10-CM

## 2012-10-08 DIAGNOSIS — G8190 Hemiplegia, unspecified affecting unspecified side: Secondary | ICD-10-CM

## 2012-10-08 DIAGNOSIS — G4710 Hypersomnia, unspecified: Secondary | ICD-10-CM

## 2012-10-08 DIAGNOSIS — R079 Chest pain, unspecified: Principal | ICD-10-CM

## 2012-10-08 DIAGNOSIS — M545 Low back pain, unspecified: Secondary | ICD-10-CM

## 2012-10-08 DIAGNOSIS — I2699 Other pulmonary embolism without acute cor pulmonale: Secondary | ICD-10-CM | POA: Diagnosis not present

## 2012-10-08 DIAGNOSIS — I209 Angina pectoris, unspecified: Secondary | ICD-10-CM | POA: Diagnosis present

## 2012-10-08 DIAGNOSIS — Z23 Encounter for immunization: Secondary | ICD-10-CM | POA: Diagnosis not present

## 2012-10-08 DIAGNOSIS — Z8679 Personal history of other diseases of the circulatory system: Secondary | ICD-10-CM

## 2012-10-08 DIAGNOSIS — I2510 Atherosclerotic heart disease of native coronary artery without angina pectoris: Secondary | ICD-10-CM

## 2012-10-08 DIAGNOSIS — Z86711 Personal history of pulmonary embolism: Secondary | ICD-10-CM | POA: Diagnosis not present

## 2012-10-08 DIAGNOSIS — E119 Type 2 diabetes mellitus without complications: Secondary | ICD-10-CM | POA: Diagnosis not present

## 2012-10-08 DIAGNOSIS — M2550 Pain in unspecified joint: Secondary | ICD-10-CM

## 2012-10-08 DIAGNOSIS — W19XXXA Unspecified fall, initial encounter: Secondary | ICD-10-CM

## 2012-10-08 DIAGNOSIS — E118 Type 2 diabetes mellitus with unspecified complications: Secondary | ICD-10-CM | POA: Diagnosis not present

## 2012-10-08 DIAGNOSIS — K1370 Unspecified lesions of oral mucosa: Secondary | ICD-10-CM | POA: Diagnosis not present

## 2012-10-08 DIAGNOSIS — R0609 Other forms of dyspnea: Secondary | ICD-10-CM | POA: Diagnosis not present

## 2012-10-08 DIAGNOSIS — M5030 Other cervical disc degeneration, unspecified cervical region: Secondary | ICD-10-CM

## 2012-10-08 DIAGNOSIS — Z8673 Personal history of transient ischemic attack (TIA), and cerebral infarction without residual deficits: Secondary | ICD-10-CM

## 2012-10-08 DIAGNOSIS — E785 Hyperlipidemia, unspecified: Secondary | ICD-10-CM

## 2012-10-08 DIAGNOSIS — I639 Cerebral infarction, unspecified: Secondary | ICD-10-CM

## 2012-10-08 LAB — POCT I-STAT TROPONIN I: Troponin i, poc: 0 ng/mL (ref 0.00–0.08)

## 2012-10-08 LAB — PRO B NATRIURETIC PEPTIDE: Pro B Natriuretic peptide (BNP): 57.6 pg/mL (ref 0–125)

## 2012-10-08 LAB — CBC
HCT: 36.3 % — ABNORMAL LOW (ref 39.0–52.0)
Hemoglobin: 12.8 g/dL — ABNORMAL LOW (ref 13.0–17.0)
MCH: 31 pg (ref 26.0–34.0)
MCHC: 35.3 g/dL (ref 30.0–36.0)
MCV: 87.9 fL (ref 78.0–100.0)
Platelets: 172 10*3/uL (ref 150–400)
RBC: 4.13 MIL/uL — ABNORMAL LOW (ref 4.22–5.81)
RDW: 12.8 % (ref 11.5–15.5)
WBC: 7.5 10*3/uL (ref 4.0–10.5)

## 2012-10-08 LAB — BASIC METABOLIC PANEL
BUN: 19 mg/dL (ref 6–23)
CO2: 28 mEq/L (ref 19–32)
Calcium: 8.9 mg/dL (ref 8.4–10.5)
Chloride: 100 mEq/L (ref 96–112)
Creatinine, Ser: 1.02 mg/dL (ref 0.50–1.35)
GFR calc Af Amer: 84 mL/min — ABNORMAL LOW (ref >90)
GFR calc non Af Amer: 72 mL/min — ABNORMAL LOW (ref >90)
Glucose, Bld: 144 mg/dL — ABNORMAL HIGH (ref 70–99)
Potassium: 4.8 mEq/L (ref 3.5–5.1)
Sodium: 134 mEq/L — ABNORMAL LOW (ref 135–145)

## 2012-10-08 LAB — GLUCOSE, CAPILLARY: Glucose-Capillary: 293 mg/dL — ABNORMAL HIGH (ref 70–99)

## 2012-10-08 MED ORDER — INSULIN ASPART 100 UNIT/ML ~~LOC~~ SOLN
0.0000 [IU] | SUBCUTANEOUS | Status: DC
  Administered 2012-10-08: 8 [IU] via SUBCUTANEOUS
  Administered 2012-10-09: 3 [IU] via SUBCUTANEOUS
  Administered 2012-10-09: 5 [IU] via SUBCUTANEOUS
  Administered 2012-10-09: 3 [IU] via SUBCUTANEOUS

## 2012-10-08 MED ORDER — ATORVASTATIN CALCIUM 10 MG PO TABS
10.0000 mg | ORAL_TABLET | Freq: Every day | ORAL | Status: DC
  Administered 2012-10-09: 10 mg via ORAL
  Filled 2012-10-08 (×2): qty 1

## 2012-10-08 MED ORDER — VALACYCLOVIR HCL 500 MG PO TABS
500.0000 mg | ORAL_TABLET | Freq: Every day | ORAL | Status: DC
  Administered 2012-10-09: 500 mg via ORAL
  Filled 2012-10-08: qty 1

## 2012-10-08 MED ORDER — FERROUS SULFATE 325 (65 FE) MG PO TABS
325.0000 mg | ORAL_TABLET | Freq: Every day | ORAL | Status: DC
  Administered 2012-10-09: 325 mg via ORAL
  Filled 2012-10-08 (×2): qty 1

## 2012-10-08 MED ORDER — DUTASTERIDE 0.5 MG PO CAPS
0.5000 mg | ORAL_CAPSULE | Freq: Every day | ORAL | Status: DC
  Administered 2012-10-09: 0.5 mg via ORAL
  Filled 2012-10-08: qty 1

## 2012-10-08 MED ORDER — OXYCODONE HCL 5 MG PO TABS: 10.0000 mg | ORAL_TABLET | Freq: Four times a day (QID) | ORAL | Status: DC | PRN

## 2012-10-08 MED ORDER — ONDANSETRON HCL 4 MG/2ML IJ SOLN: 4.0000 mg | Freq: Four times a day (QID) | INTRAMUSCULAR | Status: DC | PRN

## 2012-10-08 MED ORDER — ACETAMINOPHEN 325 MG PO TABS: 650.0000 mg | ORAL_TABLET | ORAL | Status: DC | PRN

## 2012-10-08 MED ORDER — FUROSEMIDE 20 MG PO TABS
20.0000 mg | ORAL_TABLET | Freq: Every day | ORAL | Status: DC | PRN
  Filled 2012-10-08: qty 1

## 2012-10-08 MED ORDER — REPAGLINIDE 2 MG PO TABS
2.0000 mg | ORAL_TABLET | Freq: Three times a day (TID) | ORAL | Status: DC
  Filled 2012-10-08 (×4): qty 1

## 2012-10-08 MED ORDER — RAMIPRIL 2.5 MG PO CAPS
2.5000 mg | ORAL_CAPSULE | Freq: Every evening | ORAL | Status: DC
  Administered 2012-10-08: 2.5 mg via ORAL
  Filled 2012-10-08 (×2): qty 1

## 2012-10-08 MED ORDER — ONDANSETRON HCL 4 MG/2ML IJ SOLN: 4.0000 mg | Freq: Three times a day (TID) | INTRAMUSCULAR | Status: DC | PRN

## 2012-10-08 MED ORDER — PREGABALIN 50 MG PO CAPS
300.0000 mg | ORAL_CAPSULE | Freq: Two times a day (BID) | ORAL | Status: DC
  Administered 2012-10-08 – 2012-10-09 (×2): 300 mg via ORAL
  Filled 2012-10-08 (×2): qty 6

## 2012-10-08 MED ORDER — DULOXETINE HCL 60 MG PO CPEP
60.0000 mg | ORAL_CAPSULE | Freq: Every day | ORAL | Status: DC
  Administered 2012-10-09: 60 mg via ORAL
  Filled 2012-10-08: qty 1

## 2012-10-08 MED ORDER — RIVAROXABAN 15 MG PO TABS
15.0000 mg | ORAL_TABLET | Freq: Every day | ORAL | Status: DC
  Administered 2012-10-09: 15 mg via ORAL
  Filled 2012-10-08: qty 1

## 2012-10-08 MED ORDER — VITAMIN D3 25 MCG (1000 UNIT) PO TABS
1000.0000 [IU] | ORAL_TABLET | Freq: Every day | ORAL | Status: DC
  Administered 2012-10-09: 1000 [IU] via ORAL
  Filled 2012-10-08: qty 1

## 2012-10-08 MED ORDER — ADULT MULTIVITAMIN W/MINERALS CH
1.0000 | ORAL_TABLET | Freq: Every evening | ORAL | Status: DC
  Administered 2012-10-08: 1 via ORAL
  Filled 2012-10-08 (×2): qty 1

## 2012-10-08 NOTE — ED Notes (Signed)
Patient is resting comfortably. 

## 2012-10-08 NOTE — ED Provider Notes (Signed)
CSN: [id number]     Arrival date & time 10/08/12  1420 History   First MD Initiated Contact with Patient 10/08/12 1506     Chief Complaint  Patient presents with  . Hypotension   (Consider location/radiation/quality/duration/timing/severity/associated sxs/prior Treatment) The history is provided by the patient and medical records. No language interpreter was used.    Steve Brown is a 72 y.o. male  with a hx of DM, CAD, HTN, CVA (2002 and 2003), TIAs, angina presents to the Emergency Department complaining of acute, now resolved left sided chest pain which began after receiving his flu shot. It was associated with blurred vision which began only several minutes after the chest pain and was described as generalized blurry vision without diplopia, lights or floaters.  He reports that the chest pain was located on the left side of his chest, described as a squeezing sensation and unlike his usual angina.  Pt was given a SL nitro with resolution of his chest pain within 10 minutes.  His blurry vision lasted approximately 45 minutes and resolved spontanesouly after his arrival at the hospital. Pt denies fever, chills, headache, neck pain, cough, nasal congestion, abdominal pain, nausea, vomiting, diarrhea, weakness, dizziness, lightheadedness, syncope, dysuria, hematuria.    He reports he was at the doctor's office to see his PCP about some dyspnea on exertion which has been intermittent for several years.  He reports this issue began 1-2 weeks ago and is made worse when he walks up the stairs in his home.  Record review shows that pt was dx with bilateral PEs in June 2014 for which he is taking Xarelto.    Past Medical History  Diagnosis Date  . Diabetes mellitus   . Coronary artery disease   . Hypertension   . Stroke 2002, 2003  . History of TIAs   . Lumbago   . Benign localized hyperplasia of prostate without urinary obstruction and other lower urinary tract symptoms (LUTS)   . Other and  unspecified hyperlipidemia   . Unspecified essential hypertension   . Type II or unspecified type diabetes mellitus without mention of complication, not stated as uncontrolled   . Personal history of unspecified circulatory disease   . Unspecified fall   . Hypersomnia with sleep apnea, unspecified   . Pain in joint, multiple sites   . Degeneration of cervical intervertebral disc   . Unspecified cardiovascular disease    Past Surgical History  Procedure Laterality Date  . Hernia repair    . Carpal tunnel release      bilateral  . Elbow surgery      right elbow - nerve release  . Cervical disc surgery    . Lungs      "fluid pumped off lungs"  . Coronary stent placement  Feb. 2010  . Cardiac catheterization    . Coronary angioplasty     Family History  Problem Relation Age of Onset  . Aneurysm Mother   . Cancer Father    History  Substance Use Topics  . Smoking status: Never Smoker   . Smokeless tobacco: Never Used  . Alcohol Use: No    Review of Systems  Constitutional: Negative for fever, diaphoresis, appetite change, fatigue and unexpected weight change.  HENT: Negative for mouth sores and neck stiffness.   Eyes: Positive for visual disturbance.  Respiratory: Positive for shortness of breath. Negative for cough, chest tightness and wheezing.   Cardiovascular: Positive for chest pain.  Gastrointestinal: Negative for nausea, vomiting, abdominal pain,  diarrhea and constipation.  Endocrine: Negative for polydipsia, polyphagia and polyuria.  Genitourinary: Negative for dysuria, urgency, frequency and hematuria.  Musculoskeletal: Negative for back pain.  Skin: Negative for rash.  Allergic/Immunologic: Negative for immunocompromised state.  Neurological: Negative for syncope, light-headedness and headaches.  Hematological: Does not bruise/bleed easily.  Psychiatric/Behavioral: Negative for sleep disturbance. The patient is not nervous/anxious.     Allergies  Review of  patient's allergies indicates no known allergies.  Home Medications   Current Outpatient Rx  Name  Route  Sig  Dispense  Refill  . cholecalciferol (VITAMIN D) 1000 UNITS tablet   Oral   Take 1,000 Units by mouth daily.         . diclofenac sodium (VOLTAREN) 1 % GEL   Topical   Apply 2 g topically 3 (three) times daily as needed (for pain in left arm and shoulder).   1 Tube   5   . DULoxetine (CYMBALTA) 60 MG capsule   Oral   Take 60 mg by mouth daily.         Marland Kitchen dutasteride (AVODART) 0.5 MG capsule   Oral   Take 0.5 mg by mouth daily.         . ferrous sulfate 325 (65 FE) MG tablet   Oral   Take 325 mg by mouth daily with breakfast.         . furosemide (LASIX) 20 MG tablet   Oral   Take 20 mg by mouth daily as needed (swelling).          . insulin detemir (LEVEMIR) 100 UNIT/ML injection   Subcutaneous   Inject 18-25 Units into the skin daily. Based on sugar levels         . metFORMIN (GLUCOPHAGE) 500 MG tablet   Oral   Take 1,000 mg by mouth 2 (two) times daily with a meal.         . Multiple Vitamin (MULTIVITAMIN WITH MINERALS) TABS   Oral   Take 1 tablet by mouth every evening.          . pregabalin (LYRICA) 300 MG capsule   Oral   Take 1 capsule (300 mg total) by mouth 2 (two) times daily.   180 capsule   3     PLEASE SCHEDULE APPT Pharmacy Fax [phone]   . ramipril (ALTACE) 2.5 MG capsule   Oral   Take 2.5 mg by mouth every evening.          . repaglinide (PRANDIN) 2 MG tablet   Oral   Take 2 mg by mouth 3 (three) times daily before meals.         . Rivaroxaban (XARELTO) 15 MG TABS tablet   Oral   Take 1 tablet (15 mg total) by mouth 2 (two) times daily. Dispense this dose for 21 days, thereafter her primary care physician.   42 tablet   0   . rosuvastatin (CRESTOR) 10 MG tablet   Oral   Take 10 mg by mouth every evening.          . tapentadol (NUCYNTA) 50 MG TABS   Oral   Take 50 mg by mouth 3 (three) times daily as  needed (for pain).         . valACYclovir (VALTREX) 500 MG tablet   Oral   Take 500 mg by mouth daily.          BP 129/108  Pulse 123  Temp(Src) 98 F (36.7 C) (Oral)  Resp 20  SpO2 93% Physical Exam  Nursing note and vitals reviewed. Constitutional: He is oriented to person, place, and time. He appears well-developed and well-nourished. No distress.  Awake, alert, nontoxic appearance  HENT:  Head: Normocephalic and atraumatic.  Mouth/Throat: Oropharynx is clear and moist. No oropharyngeal exudate.  Eyes: Conjunctivae are normal. Pupils are equal, round, and reactive to light. No scleral icterus.  Neck: Normal range of motion. Neck supple.  Cardiovascular: Normal rate, regular rhythm, normal heart sounds and intact distal pulses.   No murmur heard. Pulmonary/Chest: Effort normal and breath sounds normal. No accessory muscle usage. Not tachypneic. No respiratory distress. He has no decreased breath sounds. He has no wheezes. He has no rhonchi. He has no rales. He exhibits no tenderness and no bony tenderness.  Abdominal: Soft. Bowel sounds are normal. He exhibits no mass. There is no tenderness. There is no rebound and no guarding.  Musculoskeletal: Normal range of motion. He exhibits no edema and no tenderness.  Lymphadenopathy:    He has no cervical adenopathy.  Neurological: He is alert and oriented to person, place, and time. He exhibits normal muscle tone. Coordination normal.  Speech is clear and goal oriented Moves extremities without ataxia  Skin: Skin is warm and dry. He is not diaphoretic. No erythema.  Psychiatric: He has a normal mood and affect. His behavior is normal.    ED Course  Procedures (including critical care time) Labs Review Labs Reviewed  CBC - Abnormal; Notable for the following:    RBC 4.13 (*)    Hemoglobin 12.8 (*)    HCT 36.3 (*)    All other components within normal limits  BASIC METABOLIC PANEL - Abnormal; Notable for the following:     Sodium 134 (*)    Glucose, Bld 144 (*)    GFR calc non Af Amer 72 (*)    GFR calc Af Amer 84 (*)    All other components within normal limits  PRO B NATRIURETIC PEPTIDE  POCT I-STAT TROPONIN I   Imaging Review Dg Chest 2 View  10/08/2012   CLINICAL DATA:  Chest pain  EXAM: CHEST  2 VIEW  COMPARISON:  06/30/2012  FINDINGS: Cardiomediastinal silhouette is stable. No acute infiltrate or pleural effusion. No pulmonary edema. Stable mild degenerative changes thoracic spine.  IMPRESSION: No active cardiopulmonary disease.  No significant change.   Electronically Signed   By: Natasha Mead   On: 10/08/2012 15:41    ECG:  Date: 10/08/2012  Rate: 56  Rhythm: normal sinus rhythm  QRS Axis: normal  Intervals: normal  ST/T Wave abnormalities: nonspecific ST changes  Conduction Disutrbances:none  Narrative Interpretation: ST elevation noted in V2, ECG on 06/30/12 with inverted T waves V1-V4 without ST elevations; EMS ECG reviewed, taken at 1355 with 2 mm of ST elevation in V2 and V3.    Old EKG Reviewed: changes noted    MDM   1. Chest pain   2. Hypertension   3. History of TIAs   4. Other and unspecified hyperlipidemia   5. Type II or unspecified type diabetes mellitus without mention of complication, not stated as uncontrolled   6. Pulmonary embolism     Steve Brown presents with chest pain resolved with nitro and associated blurry vision; all now resolved.  Patient with chest pain and improvement and eventual resolution after nitroglycerin and changes on his EKG.  Initial troponin negative, further labs pending.  We'll plan for admission and ACS rule out.  5:41  PM Initial troponin negative, CBC and BMP unremarkable. Chest x-ray without active pulmonary disease.  Patient's chest pain has resolved and has not returned however his ECG is concerning for possible acute coronary syndrome. We'll admit to triad for ACS rule out.  Concern for cardiac etiology of Chest Pain. Cardiology has been  consulted and will see patient in the ED for likely admit.  Pt has been re-evaluated prior to consult and VSS, NAD, heart RRR, pain 0/10, lungs CTAB. Abnormalities on EKG as noted above and first round of cardiac enzymes negative. This case was discussed with Dr. Freida Busman who has seen the patient and agrees with plan to admit.    On record review patient was [facility] cardiology in 2012. He was recently admitted and diagnosed with a PE on 06/30/2012.  He is currently anticoagulated with Xarelto.   9:14 PM Discussed with Dr Tresa Endo of [facility] Cardiology who recommends general medicine admission and ACS rule out.  Will admit to medicine.   Dahlia Client Steve Plotkin, PA-C 10/08/12 2115

## 2012-10-08 NOTE — ED Notes (Signed)
According to EMS, patient went to his MD's office to receive his flu shot and started having chest pain after the shot.  The patient received one SL nitro and his blood pressure dropped.   Upon EMS arrival the patient's blood pressure 98/76 so they placed an IV and initiated a saline bolus. He received 200cc of NS and transported here.  According to EMS, Orthostatic vitals were positive.  The patient was transported here.

## 2012-10-08 NOTE — ED Notes (Signed)
Bed in Low post w/ brakes on and side rails up x 2. Call bell within reach of pt. Comfort measures offered.  

## 2012-10-08 NOTE — ED Notes (Addendum)
Pt requesting to eat, PA notified and said that it was fine for pt to eat. Gave pt dt coke and water, meal try has been ordered.

## 2012-10-08 NOTE — ED Provider Notes (Signed)
Medical screening examination/treatment/procedure(s) were conducted as a shared visit with non-physician practitioner(s) and myself.  I personally evaluated the patient during the encounter  Pt with chest pain pta that was relieved with nitro--ecg reviewed and new changes noted, pt pain free at this point, will admit for observation  Toy Baker, MD 10/08/12 1642

## 2012-10-08 NOTE — H&P (Signed)
[facility] History and Physical  Dalon Reichart GNF:[id number] DOB: 1940-11-10 DOA: 10/08/2012  Referring physician: ED PCP: Miguel Aschoff, MD  Chief Complaint: Chest pain  HPI: Steve Brown is a 72 y.o. male with pmh including CAD s/p stent in 2010, PE in June of this year on Xarelto.  He presents to the ED after an episode of chest pain that occurred earlier today after getting blood draw and his flu shot.  Pain was located on the L side of his chest, improved with NTG.  Patient notes that since his PE he has been having DOE even with mild exercise but he has been pushing through this and still exercises routinely.  Review of Systems: 12 systems reviewed and otherwise negative.  Past Medical History  Diagnosis Date  . Diabetes mellitus   . Coronary artery disease   . Hypertension   . Stroke 2002, 2003  . History of TIAs   . Lumbago   . Benign localized hyperplasia of prostate without urinary obstruction and other lower urinary tract symptoms (LUTS)   . Other and unspecified hyperlipidemia   . Unspecified essential hypertension   . Type II or unspecified type diabetes mellitus without mention of complication, not stated as uncontrolled   . Personal history of unspecified circulatory disease   . Unspecified fall   . Hypersomnia with sleep apnea, unspecified   . Pain in joint, multiple sites   . Degeneration of cervical intervertebral disc   . Unspecified cardiovascular disease    Past Surgical History  Procedure Laterality Date  . Hernia repair    . Carpal tunnel release      bilateral  . Elbow surgery      right elbow - nerve release  . Cervical disc surgery    . Lungs      "fluid pumped off lungs"  . Coronary stent placement  Feb. 2010  . Cardiac catheterization    . Coronary angioplasty     Social History:  reports that he has never smoked. He has never used smokeless tobacco. He reports that he does not drink alcohol or use illicit drugs.   No Known  Allergies  Family History  Problem Relation Age of Onset  . Aneurysm Mother   . Cancer Father     Prior to Admission medications   Medication Sig Start Date End Date Taking? Authorizing Provider  cholecalciferol (VITAMIN D) 1000 UNITS tablet Take 1,000 Units by mouth daily.   Yes Historical Provider, MD  diclofenac sodium (VOLTAREN) 1 % GEL Apply 2 g topically 3 (three) times daily as needed (for pain in left arm and shoulder). 07/26/12  Yes Carmen Dohmeier, MD  DULoxetine (CYMBALTA) 60 MG capsule Take 60 mg by mouth daily.   Yes Historical Provider, MD  dutasteride (AVODART) 0.5 MG capsule Take 0.5 mg by mouth daily.   Yes Historical Provider, MD  ferrous sulfate 325 (65 FE) MG tablet Take 325 mg by mouth daily with breakfast.   Yes Historical Provider, MD  furosemide (LASIX) 20 MG tablet Take 20 mg by mouth daily as needed (swelling).    Yes Historical Provider, MD  insulin detemir (LEVEMIR) 100 UNIT/ML injection Inject 18-25 Units into the skin daily. Based on sugar levels   Yes Historical Provider, MD  metFORMIN (GLUCOPHAGE) 500 MG tablet Take 1,000 mg by mouth 2 (two) times daily with a meal.   Yes Historical Provider, MD  Multiple Vitamin (MULTIVITAMIN WITH MINERALS) TABS Take 1 tablet by mouth every evening.  Yes Historical Provider, MD  pregabalin (LYRICA) 300 MG capsule Take 1 capsule (300 mg total) by mouth 2 (two) times daily. 07/26/12  Yes Carmen Dohmeier, MD  ramipril (ALTACE) 2.5 MG capsule Take 2.5 mg by mouth every evening.    Yes Historical Provider, MD  repaglinide (PRANDIN) 2 MG tablet Take 2 mg by mouth 3 (three) times daily before meals.   Yes Historical Provider, MD  Rivaroxaban (XARELTO) 15 MG TABS tablet Take 15 mg by mouth daily. Dispense this dose for 21 days, thereafter her primary care physician. 07/02/12  Yes Leroy Sea, MD  rosuvastatin (CRESTOR) 10 MG tablet Take 10 mg by mouth every evening.    Yes Historical Provider, MD  tapentadol (NUCYNTA) 50 MG TABS  Take 50 mg by mouth 3 (three) times daily as needed (for pain).   Yes Historical Provider, MD  valACYclovir (VALTREX) 500 MG tablet Take 500 mg by mouth daily.   Yes Historical Provider, MD   Physical Exam: Filed Vitals:   10/08/12 2214  BP: 131/61  Pulse: 80  Temp:   Resp: 18     General:  NAD, resting comfortably in bed  Eyes: PEERLA EOMI  ENT: mucous membranes moist  Neck: supple w/o JVD  Cardiovascular: RRR w/o MRG  Respiratory: CTA B  Abdomen: soft, nt, nd, bs+  Skin: no rash nor lesion  Musculoskeletal: MAE, full ROM all 4 extremities  Psychiatric: normal tone and affect  Neurologic: AAOx3, grossly non-focal   Labs on Admission:  Basic Metabolic Panel:  Recent Labs Lab 10/08/12 1523  NA 134*  K 4.8  CL 100  CO2 28  GLUCOSE 144*  BUN 19  CREATININE 1.02  CALCIUM 8.9   Liver Function Tests: No results found for this basename: AST, ALT, ALKPHOS, BILITOT, PROT, ALBUMIN,  in the last 168 hours No results found for this basename: LIPASE, AMYLASE,  in the last 168 hours No results found for this basename: AMMONIA,  in the last 168 hours CBC:  Recent Labs Lab 10/08/12 1523  WBC 7.5  HGB 12.8*  HCT 36.3*  MCV 87.9  PLT 172   Cardiac Enzymes: No results found for this basename: CKTOTAL, CKMB, CKMBINDEX, TROPONINI,  in the last 168 hours  BNP (last 3 results)  Recent Labs  06/30/12 1849 10/08/12 1523  PROBNP 1794.0* 57.6   CBG: No results found for this basename: GLUCAP,  in the last 168 hours  Radiological Exams on Admission: Dg Chest 2 View  10/08/2012   CLINICAL DATA:  Chest pain  EXAM: CHEST  2 VIEW  COMPARISON:  06/30/2012  FINDINGS: Cardiomediastinal silhouette is stable. No acute infiltrate or pleural effusion. No pulmonary edema. Stable mild degenerative changes thoracic spine.  IMPRESSION: No active cardiopulmonary disease.  No significant change.   Electronically Signed   By: Natasha Mead   On: 10/08/2012 15:41    EKG:  Independently reviewed.  EKG improved since last visit (with the acute PE) and now appears to be back to where it was in 2013.  Assessment/Plan Principal Problem:   Chest pain Active Problems:   Diabetes mellitus type 2 with complications   CAD in native artery   Pulmonary embolism   1. Chest pain 2. H/o CAD - Patient needs cardiac rule out, admitting under chest pain obs protocol, needs cards consult in AM HEART score = 7, although the EKG findings appear to be baseline from 2013. 3. H/o PE - alternatively the patients chronic DOE could also be due  to pulmonary HTN as a sequella of his PE back in June, the history of the chronic DOE fits this picture and the PE was large enough to cause EKG changes during that admission (which have since resolved). 4. DM2 - putting patient on q4h SSI while NPO.    Code Status: Full (must indicate code status--if unknown or must be presumed, indicate so) Family Communication: Wife at bedside (indicate person spoken with, if applicable, with phone number if by telephone) Disposition Plan: Admit to obs (indicate anticipated LOS)  Time spent: 70 min  Demaris Leavell M. [facility] Pager [phone]  If 7PM-7AM, please contact night-coverage [email or web address] Password [facility] 10/08/2012, 10:49 PM

## 2012-10-09 ENCOUNTER — Encounter (HOSPITAL_COMMUNITY): Payer: Self-pay | Admitting: *Deleted

## 2012-10-09 DIAGNOSIS — R079 Chest pain, unspecified: Secondary | ICD-10-CM | POA: Diagnosis not present

## 2012-10-09 DIAGNOSIS — I2699 Other pulmonary embolism without acute cor pulmonale: Secondary | ICD-10-CM

## 2012-10-09 DIAGNOSIS — I10 Essential (primary) hypertension: Secondary | ICD-10-CM | POA: Diagnosis not present

## 2012-10-09 DIAGNOSIS — E118 Type 2 diabetes mellitus with unspecified complications: Secondary | ICD-10-CM | POA: Diagnosis not present

## 2012-10-09 DIAGNOSIS — I2510 Atherosclerotic heart disease of native coronary artery without angina pectoris: Secondary | ICD-10-CM | POA: Diagnosis not present

## 2012-10-09 LAB — GLUCOSE, CAPILLARY
Glucose-Capillary: 178 mg/dL — ABNORMAL HIGH (ref 70–99)
Glucose-Capillary: 186 mg/dL — ABNORMAL HIGH (ref 70–99)
Glucose-Capillary: 207 mg/dL — ABNORMAL HIGH (ref 70–99)

## 2012-10-09 LAB — TROPONIN I
Troponin I: <0.3 ng/mL (ref <0.30)
Troponin I: <0.3 ng/mL (ref <0.30)
Troponin I: <0.3 ng/mL (ref <0.30)

## 2012-10-09 NOTE — Progress Notes (Signed)
Utilization review completed.  

## 2012-10-09 NOTE — Progress Notes (Signed)
Inpatient Diabetes Program Recommendations  AACE/ADA: New Consensus Statement on Inpatient Glycemic Control (2013)  Target Ranges:  Prepandial:   less than 140 mg/dL      Peak postprandial:   less than 180 mg/dL (1-2 hours)      Critically ill patients:  140 - 180 mg/dL   Reason for Visit: Results for KASEAN, DENHERDER (MRN [id number]) as of 10/09/2012 10:55  Ref. Range 10/08/2012 22:46 10/09/2012 04:33 10/09/2012 08:40  Glucose-Capillary Latest Range: 70-99 mg/dL 578 (H) 469 (H) 629 (H)   Note that patient took basal insulin prior to admit.  Please add Levemir 18 units daily.  Also please consider rechecking A1C.   Beryl Meager, RN, BC-ADM Inpatient Diabetes Coordinator Pager [phone]

## 2012-10-09 NOTE — Discharge Summary (Signed)
Physician Discharge Summary  Steve Brown VOZ:[id number] DOB: [birth year] DOA: 10/08/2012  PCP: Miguel Aschoff, MD  Admit date: 10/08/2012 Discharge date: 10/09/2012  Discharge Diagnoses:  Principal Problem:   Chest pain Active Problems:   Diabetes mellitus type 2 with complications   CAD in native artery   Previous history of Pulmonary embolism  Discharge Condition: stable  Filed Weights   10/08/12 2245  Weight: 71.396 kg (157 lb 6.4 oz)    History of present illness:  Steve Brown is a 72 y.o. male with pmh including CAD s/p stent in 2010, PE in June of this year on Xarelto. He presents to the ED after an episode of chest pain that occurred earlier today after getting blood draw and his flu shot. Pain was located on the L side of his chest, improved with NTG. Patient notes that since his PE he has been having DOE even with mild exercise but he has been pushing through this and still exercises routinely.  Hospital Course:  Patient was admitted to telemetry. MI ruled out. EKG showed no acute changes. No recurrence of chest pain. Has seen Dr. Eldridge Dace in the past and I've asked him to schedule a followup visit.  Discharge Exam: Filed Vitals:   10/09/12 0400  BP: 123/61  Pulse: 64  Temp: 98.9 F (37.2 C)  Resp: 18    General: Comfortable. Alert and oriented per Cardiovascular: Regular rate rhythm without murmurs gallops rubs Respiratory: Clear to auscultation bilaterally without wheeze rhonchi or rales Musculoskeletal: No chest wall tenderness. Extremities: No clubbing cyanosis or edema  Discharge Instructions  Discharge Orders   Future Appointments Provider Department Dept Phone   04/15/2013 2:00 PM Everette Rank, MD [facility] [phone]   07/24/2013 3:00 PM Melvyn Novas, MD [facility] [phone]   08/12/2013 2:00 PM Windell Hummingbird [facility] MEDICAL ONCOLOGY [phone]   08/12/2013 2:30 PM Levert Feinstein,  MD [facility] MEDICAL ONCOLOGY [phone]   Future Orders Complete By Expires   Activity as tolerated - No restrictions  As directed    Diet - low sodium heart healthy  As directed    Diet Carb Modified  As directed        Medication List         cholecalciferol 1000 UNITS tablet  Commonly known as:  VITAMIN D  Take 1,000 Units by mouth daily.     diclofenac sodium 1 % Gel  Commonly known as:  VOLTAREN  Apply 2 g topically 3 (three) times daily as needed (for pain in left arm and shoulder).     DULoxetine 60 MG capsule  Commonly known as:  CYMBALTA  Take 60 mg by mouth daily.     dutasteride 0.5 MG capsule  Commonly known as:  AVODART  Take 0.5 mg by mouth daily.     ferrous sulfate 325 (65 FE) MG tablet  Take 325 mg by mouth daily with breakfast.     furosemide 20 MG tablet  Commonly known as:  LASIX  Take 20 mg by mouth daily as needed (swelling).     insulin detemir 100 UNIT/ML injection  Commonly known as:  LEVEMIR  Inject 18-25 Units into the skin daily. Based on sugar levels     metFORMIN 500 MG tablet  Commonly known as:  GLUCOPHAGE  Take 1,000 mg by mouth 2 (two) times daily with a meal.     multivitamin with minerals Tabs tablet  Take 1 tablet by mouth  every evening.     pregabalin 300 MG capsule  Commonly known as:  LYRICA  Take 1 capsule (300 mg total) by mouth 2 (two) times daily.     ramipril 2.5 MG capsule  Commonly known as:  ALTACE  Take 2.5 mg by mouth every evening.     repaglinide 2 MG tablet  Commonly known as:  PRANDIN  Take 2 mg by mouth 3 (three) times daily before meals.     Rivaroxaban 15 MG Tabs tablet  Commonly known as:  XARELTO  Take 15 mg by mouth daily. Dispense this dose for 21 days, thereafter her primary care physician.     rosuvastatin 10 MG tablet  Commonly known as:  CRESTOR  Take 10 mg by mouth every evening.     tapentadol 50 MG Tabs tablet  Commonly known as:  NUCYNTA  Take 50 mg by mouth 3  (three) times daily as needed (for pain).     valACYclovir 500 MG tablet  Commonly known as:  VALTREX  Take 500 mg by mouth daily.       No Known Allergies     Follow-up Information   Schedule an appointment as soon as possible for a visit with Corky Crafts., MD.   Specialty:  Cardiology   Contact information:   [address] [place] [address] [phone]        The results of significant diagnostics from this hospitalization (including imaging, microbiology, ancillary and laboratory) are listed below for reference.    Significant Diagnostic Studies: Dg Chest 2 View  10/08/2012   CLINICAL DATA:  Chest pain  EXAM: CHEST  2 VIEW  COMPARISON:  06/30/2012  FINDINGS: Cardiomediastinal silhouette is stable. No acute infiltrate or pleural effusion. No pulmonary edema. Stable mild degenerative changes thoracic spine.  IMPRESSION: No active cardiopulmonary disease.  No significant change.   Electronically Signed   By: Natasha Mead   On: 10/08/2012 15:41   EKG Sinus rhythm RSR' in V1 or V2, right VCD or RVH Borderline ST elevation, anterior leads  Microbiology: No results found for this or any previous visit (from the past 240 hour(s)).   Labs: Basic Metabolic Panel:  Recent Labs Lab 10/08/12 1523  NA 134*  K 4.8  CL 100  CO2 28  GLUCOSE 144*  BUN 19  CREATININE 1.02  CALCIUM 8.9   Liver Function Tests: No results found for this basename: AST, ALT, ALKPHOS, BILITOT, PROT, ALBUMIN,  in the last 168 hours No results found for this basename: LIPASE, AMYLASE,  in the last 168 hours No results found for this basename: AMMONIA,  in the last 168 hours CBC:  Recent Labs Lab 10/08/12 1523  WBC 7.5  HGB 12.8*  HCT 36.3*  MCV 87.9  PLT 172   Cardiac Enzymes:  Recent Labs Lab 10/09/12 0005 10/09/12 0532 10/09/12 1235  TROPONINI <0.30 <0.30 <0.30   BNP: BNP (last 3 results)  Recent Labs  06/30/12 1849 10/08/12 1523  PROBNP 1794.0*  57.6   CBG:  Recent Labs Lab 10/08/12 2246 10/09/12 0433 10/09/12 0840 10/09/12 1128  GLUCAP 293* 178* 186* 207*   Signed:  Platon Arocho L  [facility] 10/09/2012, 2:17 PM

## 2012-10-11 NOTE — ED Provider Notes (Signed)
Medical screening examination/treatment/procedure(s) were performed by non-physician practitioner and as supervising physician I was immediately available for consultation/collaboration.  Toy Baker, MD 10/11/12 [phone]

## 2012-10-15 ENCOUNTER — Encounter: Payer: Self-pay | Admitting: Interventional Cardiology

## 2012-10-15 ENCOUNTER — Ambulatory Visit (INDEPENDENT_AMBULATORY_CARE_PROVIDER_SITE_OTHER): Payer: Medicare Other | Admitting: Interventional Cardiology

## 2012-10-15 VITALS — BP 108/66 | HR 73 | Ht 65.0 in | Wt 160.0 lb

## 2012-10-15 DIAGNOSIS — I10 Essential (primary) hypertension: Secondary | ICD-10-CM

## 2012-10-15 DIAGNOSIS — I2510 Atherosclerotic heart disease of native coronary artery without angina pectoris: Secondary | ICD-10-CM | POA: Diagnosis not present

## 2012-10-15 DIAGNOSIS — R079 Chest pain, unspecified: Secondary | ICD-10-CM

## 2012-10-15 DIAGNOSIS — I2699 Other pulmonary embolism without acute cor pulmonale: Secondary | ICD-10-CM | POA: Diagnosis not present

## 2012-10-15 DIAGNOSIS — K1370 Unspecified lesions of oral mucosa: Secondary | ICD-10-CM | POA: Diagnosis not present

## 2012-10-15 DIAGNOSIS — K1321 Leukoplakia of oral mucosa, including tongue: Secondary | ICD-10-CM | POA: Diagnosis not present

## 2012-10-15 NOTE — Progress Notes (Signed)
Patient ID: Steve Brown, male   DOB: 06-16-1940, 72 y.o.   MRN: [id number]    [address] [place], [place]  [address] Phone: [phone] Fax:  [phone]  Date:  10/15/2012   ID:  Steve Brown, DOB [birth year], MRN [id number]  PCP:  Miguel Aschoff, MD      History of Present Illness: Steve Brown is a 72 y.o. male  who had an LAD stent a few years ago. 2012 cath showed patent stent and no significant CAD. He will restart exercise tomorrow, 4-5 days /week doing an arthritis class, and using the treadmill.  He went on an 8 hour bus ride. He ended up with DOE. He went to the ER. He had bilateral PE.  Chest pain episode that prompted visit to Dr. Tenny Craw.  After NTG, he came close to passing out and went to ER.  W/u was negative.  Since then, he has had one episode of chest discomfort at night.  No CP with walking.  He has not been to the gym in a few weeks.  He has been fatigued.   CAD/ASCVD:  Denies.  Dizziness.  Leg edema.  Nitroglycerin.  Palpitations.  Paroxysmal nocturnal dyspnea.  Syncope.     Wt Readings from Last 3 Encounters:  10/15/12 160 lb (72.576 kg)  10/08/12 157 lb 6.4 oz (71.396 kg)  08/14/12 161 lb (73.029 kg)     Past Medical History  Diagnosis Date  . Diabetes mellitus   . Coronary artery disease   . Hypertension   . Stroke 2002, 2003  . History of TIAs   . Lumbago   . Benign localized hyperplasia of prostate without urinary obstruction and other lower urinary tract symptoms (LUTS)   . Other and unspecified hyperlipidemia   . Unspecified essential hypertension   . Type II or unspecified type diabetes mellitus without mention of complication, not stated as uncontrolled   . Personal history of unspecified circulatory disease   . Unspecified fall   . Hypersomnia with sleep apnea, unspecified   . Pain in joint, multiple sites   . Degeneration of cervical intervertebral disc   . Unspecified cardiovascular disease     Current Outpatient  Prescriptions  Medication Sig Dispense Refill  . cholecalciferol (VITAMIN D) 1000 UNITS tablet Take 1,000 Units by mouth daily.      . diclofenac sodium (VOLTAREN) 1 % GEL Apply 2 g topically 3 (three) times daily as needed (for pain in left arm and shoulder).  1 Tube  5  . DULoxetine (CYMBALTA) 60 MG capsule Take 60 mg by mouth daily.      Marland Kitchen dutasteride (AVODART) 0.5 MG capsule Take 0.5 mg by mouth daily.      . ferrous sulfate 325 (65 FE) MG tablet Take 325 mg by mouth daily with breakfast.      . furosemide (LASIX) 20 MG tablet Take 20 mg by mouth daily as needed (swelling).       . insulin detemir (LEVEMIR) 100 UNIT/ML injection Inject 18-25 Units into the skin daily. Based on sugar levels      . metFORMIN (GLUCOPHAGE) 500 MG tablet Take 1,000 mg by mouth 2 (two) times daily with a meal.      . Multiple Vitamin (MULTIVITAMIN WITH MINERALS) TABS Take 1 tablet by mouth every evening.       . pregabalin (LYRICA) 300 MG capsule Take 1 capsule (300 mg total) by mouth 2 (two) times daily.  180 capsule  3  .  ramipril (ALTACE) 2.5 MG capsule Take 2.5 mg by mouth every evening.       . repaglinide (PRANDIN) 2 MG tablet Take 2 mg by mouth 3 (three) times daily before meals.      . Rivaroxaban (XARELTO) 15 MG TABS tablet Take 15 mg by mouth daily. Dispense this dose for 21 days, thereafter her primary care physician.      . rosuvastatin (CRESTOR) 10 MG tablet Take 10 mg by mouth every evening.       . tapentadol (NUCYNTA) 50 MG TABS Take 50 mg by mouth 3 (three) times daily as needed (for pain).      . valACYclovir (VALTREX) 500 MG tablet Take 500 mg by mouth daily.       No current facility-administered medications for this visit.    Allergies:   No Known Allergies  Social History:  The patient  reports that he has never smoked. He has never used smokeless tobacco. He reports that he does not drink alcohol or use illicit drugs.   Family History:  The patient's family history includes Aneurysm in  his mother; Cancer in his father.   ROS:  Please see the history of present illness.  No nausea, vomiting.  No fevers, chills.  No focal weakness.  No dysuria.    All other systems reviewed and negative.   PHYSICAL EXAM: VS:  BP 108/66  Pulse 73  Ht 5\' 5"  (1.651 m)  Wt 160 lb (72.576 kg)  BMI 26.63 kg/m2  SpO2 96% Well nourished, well developed, in no acute distress HEENT: normal Neck: no JVD, no carotid bruits Cardiac:  normal S1, S2; RRR;  Lungs:  clear to auscultation bilaterally, no wheezing, rhonchi or rales Abd: soft, nontender, no hepatomegaly Ext: no edema Skin: warm and dry Neuro:   no focal abnormalities noted  EKG:  Sinus brady, no ST segment changes  ASSESSMENT AND PLAN:  1. Chest pain.  SOme atypical features. Plan for cardiolite. He had anterior T wave changes at Time of PE that have resolved since then.    Atherosclerotic heart disease of native coronary artery with angina pectoris   LAD DE stent over a year old.  He did have anterior T wave inversions with large bilateral PE. No problems with walking. Off antiplatelet therapy due to Xarelto.  2. Essential hypertension, benign  Continue Ramipril Capsule, 2.5 MG, TAKE 1 CAPSULE EVERY DAY Notes: Controlled. Check at the pharmacy. If readings continue to be low, may need to stop ramipril.  3. Pulmonary embolism  Continue Xarelto 20 mg tablet, ., 1 tablet, orally, once a day Notes: Large bilateral PE. He will be seeing a hematologist. they'll need to determine whether or not he has an underlying hypercoagulable state.    Signed, Fredric Mare, MD, [facility] 10/15/2012 2:26 PM

## 2012-10-15 NOTE — Patient Instructions (Addendum)
Your physician has requested that you have en exercise stress myoview. For further information please visit [email or web address]. Please follow instruction sheet, as given.   Your physician recommends that you continue on your current medications as directed. Please refer to the Current Medication list given to you today. 

## 2012-10-18 DIAGNOSIS — I739 Peripheral vascular disease, unspecified: Secondary | ICD-10-CM | POA: Diagnosis not present

## 2012-10-18 DIAGNOSIS — L84 Corns and callosities: Secondary | ICD-10-CM | POA: Diagnosis not present

## 2012-10-18 DIAGNOSIS — L608 Other nail disorders: Secondary | ICD-10-CM | POA: Diagnosis not present

## 2012-10-18 DIAGNOSIS — E1059 Type 1 diabetes mellitus with other circulatory complications: Secondary | ICD-10-CM | POA: Diagnosis not present

## 2012-10-22 DIAGNOSIS — M5137 Other intervertebral disc degeneration, lumbosacral region: Secondary | ICD-10-CM | POA: Diagnosis not present

## 2012-10-22 DIAGNOSIS — M47817 Spondylosis without myelopathy or radiculopathy, lumbosacral region: Secondary | ICD-10-CM | POA: Diagnosis not present

## 2012-10-22 DIAGNOSIS — G894 Chronic pain syndrome: Secondary | ICD-10-CM | POA: Diagnosis not present

## 2012-10-22 DIAGNOSIS — Z79899 Other long term (current) drug therapy: Secondary | ICD-10-CM | POA: Diagnosis not present

## 2012-10-22 DIAGNOSIS — K1321 Leukoplakia of oral mucosa, including tongue: Secondary | ICD-10-CM | POA: Diagnosis not present

## 2012-10-22 DIAGNOSIS — M67919 Unspecified disorder of synovium and tendon, unspecified shoulder: Secondary | ICD-10-CM | POA: Diagnosis not present

## 2012-10-23 ENCOUNTER — Ambulatory Visit (HOSPITAL_COMMUNITY): Payer: Medicare Other | Attending: Cardiology | Admitting: Radiology

## 2012-10-23 VITALS — BP 140/82 | HR 58 | Ht 65.0 in | Wt 159.0 lb

## 2012-10-23 DIAGNOSIS — R0609 Other forms of dyspnea: Secondary | ICD-10-CM | POA: Diagnosis not present

## 2012-10-23 DIAGNOSIS — R079 Chest pain, unspecified: Secondary | ICD-10-CM | POA: Diagnosis not present

## 2012-10-23 DIAGNOSIS — Z8673 Personal history of transient ischemic attack (TIA), and cerebral infarction without residual deficits: Secondary | ICD-10-CM | POA: Diagnosis not present

## 2012-10-23 DIAGNOSIS — R5381 Other malaise: Secondary | ICD-10-CM | POA: Insufficient documentation

## 2012-10-23 DIAGNOSIS — I2510 Atherosclerotic heart disease of native coronary artery without angina pectoris: Secondary | ICD-10-CM

## 2012-10-23 DIAGNOSIS — I252 Old myocardial infarction: Secondary | ICD-10-CM | POA: Diagnosis not present

## 2012-10-23 DIAGNOSIS — R61 Generalized hyperhidrosis: Secondary | ICD-10-CM | POA: Insufficient documentation

## 2012-10-23 DIAGNOSIS — R5383 Other fatigue: Secondary | ICD-10-CM | POA: Diagnosis not present

## 2012-10-23 DIAGNOSIS — R0989 Other specified symptoms and signs involving the circulatory and respiratory systems: Secondary | ICD-10-CM | POA: Diagnosis not present

## 2012-10-23 MED ORDER — TECHNETIUM TC 99M SESTAMIBI GENERIC - CARDIOLITE
30.0000 | Freq: Once | INTRAVENOUS | Status: AC | PRN
  Administered 2012-10-23: 30 via INTRAVENOUS

## 2012-10-23 MED ORDER — TECHNETIUM TC 99M SESTAMIBI GENERIC - CARDIOLITE
10.0000 | Freq: Once | INTRAVENOUS | Status: AC | PRN
  Administered 2012-10-23: 10 via INTRAVENOUS

## 2012-10-23 NOTE — Progress Notes (Signed)
[facility] SITE 3 NUCLEAR MED [address] [place], [place] [address] [phone]    Cardiology Nuclear Med Study  Steve Brown is a 72 y.o. male     MRN : [id number]     DOB: 1940-06-09  Procedure Date: 10/23/2012  Nuclear Med Background Indication for Stress Test:  Evaluation for Ischemia and Stent Patency History: 10/08/12 ED with CP, MI R/O,  2013 Echo EF60-65%, 2012 Normal Heart Catheterization, 2011 MPS  Normal, EF 63%, 2010 Stent LAD Cardiac Risk Factors: Carotid Disease, CVA, Hypertension, IDDM Type 2 and Lipids  Symptoms:  Chest Pain (last episode of chest discomfort was about 2-weeks ago), Diaphoresis, DOE and Fatigue   Nuclear Pre-Procedure Caffeine/Decaff Intake:  None NPO After: 8:00pm   Lungs:  Clear. O2 Sat: 98% on room air. IV 0.9% NS with Angio Cath:  22g  IV Site: R Forearm  IV Started by:  Bonnita Levan, RN  Chest Size (in):  44 Cup Size: n/a  Height: 5\' 5"  (1.651 m)  Weight:  159 lb (72.122 kg)  BMI:  Body mass index is 26.46 kg/(m^2). Tech Comments:  Held Diabetic Med'Brown, BS 261 @ 9 am    Nuclear Med Study 1 or 2 day study: 1 day  Stress Test Type:  Stress  Reading MD: Lance Muss, MD  Order Authorizing Provider:  Everette Rank, MD  Resting Radionuclide: Technetium 72m Sestamibi  Resting Radionuclide Dose: 10.8 mCi   Stress Radionuclide:  Technetium 82m Sestamibi  Stress Radionuclide Dose: 32.9 mCi           Stress Protocol Rest HR: 58 Stress HR: 134  Rest BP: 140/82 Stress BP: 161/68  Exercise Time (min): 7:15 METS: 8.9   Predicted Max HR: 150 bpm % Max HR: 89.33 bpm Rate Pressure Product: 40102   Dose of Adenosine (mg):  n/a Dose of Lexiscan: n/a mg  Dose of Atropine (mg): n/a Dose of Dobutamine: n/a mcg/kg/min (at max HR)  Stress Test Technologist: Smiley Houseman, CMA-N  Nuclear Technologist:  Doyne Keel, CNMT     Rest Procedure:  Myocardial perfusion imaging was performed at rest 45 minutes following the intravenous  administration of Technetium 71m Sestamibi.  Rest ECG: NSR - Normal EKG  Stress Procedure:  The patient exercised on the treadmill utilizing the Bruce Protocol for 7:15 minutes. The patient stopped due to fatigue and denied any chest pain.  Technetium 44m Sestamibi was injected at peak exercise and myocardial perfusion imaging was performed after a brief delay.  Stress ECG: No significant change from baseline ECG  QPS Raw Data Images:  Mild diaphragmatic attenuation; normal left ventricular size. Stress Images:  Normal homogeneous uptake in all areas of the myocardium. Rest Images:  There is decreased uptake in the inferior wall. Subtraction (SDS):  No evidence of ischemia. Transient Ischemic Dilatation (Normal <1.22):  0.93 Lung/Heart Ratio (Normal <0.45):  0.27  Quantitative Gated Spect Images QGS EDV:  85 ml QGS ESV:  30 ml  Impression Exercise Capacity:  Good exercise capacity. BP Response:  Normal blood pressure response. Clinical Symptoms:  No significant symptoms noted. ECG Impression:  No significant ST segment change suggestive of ischemia. Comparison with Prior Nuclear Study: No images to compare  Overall Impression:  Normal stress nuclear study.  LV Ejection Fraction: 65%.  LV Wall Motion:  NL LV Function; NL Wall Motion   Steve Brown.

## 2012-10-24 NOTE — Progress Notes (Signed)
Nuclear study routed to Dr. Eldridge Dace

## 2012-11-12 ENCOUNTER — Encounter: Payer: Self-pay | Admitting: Interventional Cardiology

## 2012-12-03 DIAGNOSIS — IMO0001 Reserved for inherently not codable concepts without codable children: Secondary | ICD-10-CM | POA: Diagnosis not present

## 2012-12-03 DIAGNOSIS — G894 Chronic pain syndrome: Secondary | ICD-10-CM | POA: Diagnosis not present

## 2012-12-03 DIAGNOSIS — M5030 Other cervical disc degeneration, unspecified cervical region: Secondary | ICD-10-CM | POA: Diagnosis not present

## 2012-12-03 DIAGNOSIS — Z79899 Other long term (current) drug therapy: Secondary | ICD-10-CM | POA: Diagnosis not present

## 2012-12-03 DIAGNOSIS — M5137 Other intervertebral disc degeneration, lumbosacral region: Secondary | ICD-10-CM | POA: Diagnosis not present

## 2012-12-24 DIAGNOSIS — K1321 Leukoplakia of oral mucosa, including tongue: Secondary | ICD-10-CM | POA: Diagnosis not present

## 2012-12-26 DIAGNOSIS — J329 Chronic sinusitis, unspecified: Secondary | ICD-10-CM | POA: Diagnosis not present

## 2012-12-30 DIAGNOSIS — I739 Peripheral vascular disease, unspecified: Secondary | ICD-10-CM | POA: Diagnosis not present

## 2012-12-30 DIAGNOSIS — E1059 Type 1 diabetes mellitus with other circulatory complications: Secondary | ICD-10-CM | POA: Diagnosis not present

## 2012-12-30 DIAGNOSIS — L608 Other nail disorders: Secondary | ICD-10-CM | POA: Diagnosis not present

## 2012-12-31 DIAGNOSIS — J40 Bronchitis, not specified as acute or chronic: Secondary | ICD-10-CM | POA: Diagnosis not present

## 2012-12-31 DIAGNOSIS — R05 Cough: Secondary | ICD-10-CM | POA: Diagnosis not present

## 2012-12-31 DIAGNOSIS — R059 Cough, unspecified: Secondary | ICD-10-CM | POA: Diagnosis not present

## 2013-01-06 DIAGNOSIS — N400 Enlarged prostate without lower urinary tract symptoms: Secondary | ICD-10-CM | POA: Diagnosis not present

## 2013-01-06 DIAGNOSIS — R351 Nocturia: Secondary | ICD-10-CM | POA: Diagnosis not present

## 2013-01-06 DIAGNOSIS — R972 Elevated prostate specific antigen [PSA]: Secondary | ICD-10-CM | POA: Diagnosis not present

## 2013-01-15 DIAGNOSIS — R310 Gross hematuria: Secondary | ICD-10-CM | POA: Diagnosis not present

## 2013-01-15 DIAGNOSIS — R351 Nocturia: Secondary | ICD-10-CM | POA: Diagnosis not present

## 2013-01-15 DIAGNOSIS — N400 Enlarged prostate without lower urinary tract symptoms: Secondary | ICD-10-CM | POA: Diagnosis not present

## 2013-01-15 DIAGNOSIS — R972 Elevated prostate specific antigen [PSA]: Secondary | ICD-10-CM | POA: Diagnosis not present

## 2013-01-16 ENCOUNTER — Other Ambulatory Visit (HOSPITAL_COMMUNITY): Payer: Self-pay | Admitting: Urology

## 2013-01-16 DIAGNOSIS — G894 Chronic pain syndrome: Secondary | ICD-10-CM | POA: Diagnosis not present

## 2013-01-16 DIAGNOSIS — Z79899 Other long term (current) drug therapy: Secondary | ICD-10-CM | POA: Diagnosis not present

## 2013-01-16 DIAGNOSIS — R972 Elevated prostate specific antigen [PSA]: Secondary | ICD-10-CM

## 2013-01-16 DIAGNOSIS — C61 Malignant neoplasm of prostate: Secondary | ICD-10-CM

## 2013-01-16 DIAGNOSIS — M5030 Other cervical disc degeneration, unspecified cervical region: Secondary | ICD-10-CM | POA: Diagnosis not present

## 2013-01-16 DIAGNOSIS — M5137 Other intervertebral disc degeneration, lumbosacral region: Secondary | ICD-10-CM | POA: Diagnosis not present

## 2013-02-03 ENCOUNTER — Ambulatory Visit (HOSPITAL_COMMUNITY)
Admission: RE | Admit: 2013-02-03 | Discharge: 2013-02-03 | Disposition: A | Discharge status: Home / Self Care | Payer: Medicare Other | Source: Ambulatory Visit | Attending: Urology | Admitting: Urology

## 2013-02-03 DIAGNOSIS — R972 Elevated prostate specific antigen [PSA]: Secondary | ICD-10-CM | POA: Insufficient documentation

## 2013-02-03 DIAGNOSIS — N138 Other obstructive and reflux uropathy: Secondary | ICD-10-CM | POA: Diagnosis not present

## 2013-02-03 DIAGNOSIS — N323 Diverticulum of bladder: Secondary | ICD-10-CM | POA: Diagnosis not present

## 2013-02-03 DIAGNOSIS — N403 Nodular prostate with lower urinary tract symptoms: Principal | ICD-10-CM | POA: Insufficient documentation

## 2013-02-03 DIAGNOSIS — C61 Malignant neoplasm of prostate: Secondary | ICD-10-CM

## 2013-02-03 DIAGNOSIS — N320 Bladder-neck obstruction: Secondary | ICD-10-CM | POA: Diagnosis not present

## 2013-02-03 DIAGNOSIS — N401 Enlarged prostate with lower urinary tract symptoms: Secondary | ICD-10-CM | POA: Diagnosis not present

## 2013-02-03 MED ORDER — GADOBENATE DIMEGLUMINE 529 MG/ML IV SOLN
20.0000 mL | Freq: Once | INTRAVENOUS | Status: AC | PRN
  Administered 2013-02-03: 15 mL via INTRAVENOUS

## 2013-03-07 DIAGNOSIS — D649 Anemia, unspecified: Secondary | ICD-10-CM | POA: Diagnosis not present

## 2013-03-07 DIAGNOSIS — R079 Chest pain, unspecified: Secondary | ICD-10-CM | POA: Diagnosis not present

## 2013-03-09 ENCOUNTER — Encounter: Payer: Self-pay | Admitting: Oncology

## 2013-03-12 ENCOUNTER — Ambulatory Visit (INDEPENDENT_AMBULATORY_CARE_PROVIDER_SITE_OTHER): Payer: Medicare Other | Admitting: Physician Assistant

## 2013-03-12 ENCOUNTER — Encounter: Payer: Self-pay | Admitting: Physician Assistant

## 2013-03-12 VITALS — BP 122/62 | HR 80 | Ht 65.0 in | Wt 165.0 lb

## 2013-03-12 DIAGNOSIS — I10 Essential (primary) hypertension: Secondary | ICD-10-CM | POA: Diagnosis not present

## 2013-03-12 DIAGNOSIS — I2699 Other pulmonary embolism without acute cor pulmonale: Secondary | ICD-10-CM

## 2013-03-12 DIAGNOSIS — I2510 Atherosclerotic heart disease of native coronary artery without angina pectoris: Secondary | ICD-10-CM

## 2013-03-12 DIAGNOSIS — R079 Chest pain, unspecified: Secondary | ICD-10-CM | POA: Diagnosis not present

## 2013-03-12 DIAGNOSIS — M2550 Pain in unspecified joint: Secondary | ICD-10-CM

## 2013-03-12 NOTE — Patient Instructions (Addendum)
Your physician recommends that you keep your  follow-up appointment 04/24/13 at 2:15 with DR. [place]  Your physician recommends that you continue on your current medications as directed. Please refer to the Current Medication list given to you today.

## 2013-03-12 NOTE — Assessment & Plan Note (Signed)
Controlled.  

## 2013-03-12 NOTE — Progress Notes (Signed)
HPI: This is a very pleasant 73 year old patient of Dr. Eldridge DaceVaranasi with history of coronary artery disease status post LAD stent a few years ago. Cardiac cath in 2012 showed patent stent and no significant CAD. He had recurrent chest pain in 10/2012 at which time a stress Myoview was ordered and was a normal stress nuclear study. The patient also has a history of large bilateral pulmonary embolus on Xarelto.  Last week the patient developed right-sided chest pain described as a pressure associated with fatigue. He had a cold at the same time. This usually occurred while he was watching TV and would last 5-10 minutes before which he spontaneously. He did not try nitroglycerin. He also continue to work [employer] and had no symptoms with exertion. He saw Dr. Duane LopeAlan Ross who placed him on a nitroglycerin patch. The patient has had no further chest pain.  The patient also has chronic neck pain since he had cervical fusion in 2004. This has been bothering him more recently and he hasn't seen an orthopedist since 2008.  No Known Allergies  Current Outpatient Prescriptions on File Prior to Visit: cholecalciferol (VITAMIN D) 1000 UNITS tablet, Take 1,000 Units by mouth daily., Disp: , Rfl:  diclofenac sodium (VOLTAREN) 1 % GEL, Apply 2 g topically 3 (three) times daily as needed (for pain in left arm and shoulder)., Disp: 1 Tube, Rfl: 5 DULoxetine (CYMBALTA) 60 MG capsule, Take 60 mg by mouth daily., Disp: , Rfl:  dutasteride (AVODART) 0.5 MG capsule, Take 0.5 mg by mouth daily., Disp: , Rfl:  ferrous sulfate 325 (65 FE) MG tablet, Take 325 mg by mouth daily with breakfast., Disp: , Rfl:  furosemide (LASIX) 20 MG tablet, Take 20 mg by mouth daily as needed (swelling). , Disp: , Rfl:  insulin detemir (LEVEMIR) 100 UNIT/ML injection, Inject 18-25 Units into the skin daily. Based on sugar levels, Disp: , Rfl:  metFORMIN (GLUCOPHAGE) 500 MG tablet, Take 1,000 mg by mouth 2 (two) times daily with a meal., Disp: , Rfl:   Multiple Vitamin (MULTIVITAMIN WITH MINERALS) TABS, Take 1 tablet by mouth every evening. , Disp: , Rfl:  pregabalin (LYRICA) 300 MG capsule, Take 1 capsule (300 mg total) by mouth 2 (two) times daily., Disp: 180 capsule, Rfl: 3 ramipril (ALTACE) 2.5 MG capsule, Take 2.5 mg by mouth every evening. , Disp: , Rfl:  repaglinide (PRANDIN) 2 MG tablet, Take 2 mg by mouth 3 (three) times daily before meals., Disp: , Rfl:  Rivaroxaban (XARELTO) 15 MG TABS tablet, Take 15 mg by mouth daily. Dispense this dose for 21 days, thereafter her primary care physician., Disp: , Rfl:  rosuvastatin (CRESTOR) 10 MG tablet, Take 10 mg by mouth every evening. , Disp: , Rfl:  tapentadol (NUCYNTA) 50 MG TABS, Take 50 mg by mouth 3 (three) times daily as needed (for pain)., Disp: , Rfl:  valACYclovir (VALTREX) 500 MG tablet, Take 500 mg by mouth daily., Disp: , Rfl:   No current facility-administered medications on file prior to visit.   Past Medical History:   Diabetes mellitus                                            Coronary artery disease  Hypertension                                                 Stroke                                          2002, 2003   History of TIAs                                              Lumbago                                                      Benign localized hyperplasia of prostate witho*              Other and unspecified hyperlipidemia                         Unspecified essential hypertension                           Type II or unspecified type diabetes mellitus *              Personal history of unspecified circulatory di*              Unspecified fall                                             Hypersomnia with sleep apnea, unspecified                    Pain in joint, multiple sites                                Degeneration of cervical intervertebral disc                 Unspecified cardiovascular disease                           Past Surgical History:   HERNIA REPAIR                                                 CARPAL TUNNEL RELEASE                                           Comment:bilateral   ELBOW SURGERY  Comment:right elbow - nerve release   CERVICAL DISC SURGERY                                         lungs                                                           Comment:"fluid pumped off lungs"   CORONARY STENT PLACEMENT                         Feb. 2010    CARDIAC CATHETERIZATION                                       CORONARY ANGIOPLASTY                                         Review of patient's family history indicates:   Aneurysm                       Mother                   Cancer                         Father                   Social History   Marital Status: Divorced            Spouse Name:                      Years of Education: 12              Number of children: 4           Occupational History Occupation          Associate Professor            Comment                                                      retired  Social History Main Topics   Smoking Status: Never Smoker                     Smokeless Status: Never Used                       Alcohol Use: No             Drug Use: No             Sexual Activity: Yes                    Birth Control/Protection: None  Other Topics  Concern   None on file  Social History Narrative   Patient lives at home with  his daughter and he is single.  Patient is retired.    Caffeine - one cups daily.   Right handed.    ROS: See history of present illness otherwise negative   PHYSICAL EXAM: Well-nournished, in no acute distress. Neck: No JVD, HJR, Bruit, or thyroid enlargement  Lungs: Decreased breath sounds but No tachypnea, clear without wheezing, rales, or rhonchi  Cardiovascular: RRR, PMI not displaced, positive S4, no murmur, bruit, thrill, or heave.  Abdomen: BS normal.  Soft without organomegaly, masses, lesions or tenderness.  Extremities: without cyanosis, clubbing or edema. Good distal pulses bilateral  SKin: Warm, no lesions or rashes   Musculoskeletal: No deformities  Neuro: no focal signs  BP 122/62  Pulse 80  Ht 5\' 5"  (1.651 m)  Wt 165 lb (74.844 kg)  BMI 27.46 kg/m2   EKG: Normal sinus rhythm, normal EKG  Stress Myoview 10/2012 Impression Exercise Capacity:  Good exercise capacity. BP Response:  Normal blood pressure response. Clinical Symptoms:  No significant symptoms noted. ECG Impression:  No significant ST segment change suggestive of ischemia. Comparison with Prior Nuclear Study: No images to compare  Overall Impression:  Normal stress nuclear study.  LV Ejection Fraction: 65%.  LV Wall Motion:  NL LV Function; NL Wall Motion

## 2013-03-12 NOTE — Assessment & Plan Note (Signed)
On Xarelto. Saw a hematologist in [place] and was told he didn't have a clotting disorder.

## 2013-03-12 NOTE — Assessment & Plan Note (Signed)
Chest pain at rest last week. Patient had no exertional chest pain and had a normal stress Myoview in 10/2012. His chest pain has resolved. Not sure if they resolve spontaneously or do to a nitroglycerin patch. Continue nitroglycerin patch for now. Call if any further chest pain. Followup with [name] April 16.

## 2013-03-12 NOTE — Assessment & Plan Note (Signed)
Having significant cervical neck pain, recommend follow up with orthopedist

## 2013-03-14 DIAGNOSIS — L608 Other nail disorders: Secondary | ICD-10-CM | POA: Diagnosis not present

## 2013-03-14 DIAGNOSIS — I739 Peripheral vascular disease, unspecified: Secondary | ICD-10-CM | POA: Diagnosis not present

## 2013-03-14 DIAGNOSIS — E1059 Type 1 diabetes mellitus with other circulatory complications: Secondary | ICD-10-CM | POA: Diagnosis not present

## 2013-03-20 ENCOUNTER — Observation Stay (HOSPITAL_COMMUNITY)
Admission: EM | Admit: 2013-03-20 | Discharge: 2013-03-21 | Disposition: A | Discharge status: Home / Self Care | Payer: Medicare Other | Attending: Interventional Cardiology | Admitting: Interventional Cardiology

## 2013-03-20 ENCOUNTER — Emergency Department (HOSPITAL_COMMUNITY): Payer: Medicare Other

## 2013-03-20 ENCOUNTER — Encounter (HOSPITAL_COMMUNITY): Payer: Self-pay | Admitting: Emergency Medicine

## 2013-03-20 DIAGNOSIS — I10 Essential (primary) hypertension: Secondary | ICD-10-CM | POA: Diagnosis not present

## 2013-03-20 DIAGNOSIS — Z8673 Personal history of transient ischemic attack (TIA), and cerebral infarction without residual deficits: Secondary | ICD-10-CM

## 2013-03-20 DIAGNOSIS — N400 Enlarged prostate without lower urinary tract symptoms: Secondary | ICD-10-CM | POA: Insufficient documentation

## 2013-03-20 DIAGNOSIS — Z9181 History of falling: Secondary | ICD-10-CM | POA: Insufficient documentation

## 2013-03-20 DIAGNOSIS — I6350 Cerebral infarction due to unspecified occlusion or stenosis of unspecified cerebral artery: Secondary | ICD-10-CM | POA: Diagnosis not present

## 2013-03-20 DIAGNOSIS — Z9889 Other specified postprocedural states: Secondary | ICD-10-CM | POA: Diagnosis not present

## 2013-03-20 DIAGNOSIS — R55 Syncope and collapse: Secondary | ICD-10-CM | POA: Diagnosis present

## 2013-03-20 DIAGNOSIS — G4710 Hypersomnia, unspecified: Secondary | ICD-10-CM | POA: Diagnosis not present

## 2013-03-20 DIAGNOSIS — Z79899 Other long term (current) drug therapy: Secondary | ICD-10-CM | POA: Diagnosis not present

## 2013-03-20 DIAGNOSIS — M5030 Other cervical disc degeneration, unspecified cervical region: Secondary | ICD-10-CM | POA: Insufficient documentation

## 2013-03-20 DIAGNOSIS — I209 Angina pectoris, unspecified: Secondary | ICD-10-CM | POA: Diagnosis present

## 2013-03-20 DIAGNOSIS — Z794 Long term (current) use of insulin: Secondary | ICD-10-CM | POA: Diagnosis not present

## 2013-03-20 DIAGNOSIS — E119 Type 2 diabetes mellitus without complications: Secondary | ICD-10-CM | POA: Insufficient documentation

## 2013-03-20 DIAGNOSIS — L74519 Primary focal hyperhidrosis, unspecified: Secondary | ICD-10-CM | POA: Diagnosis not present

## 2013-03-20 DIAGNOSIS — R079 Chest pain, unspecified: Secondary | ICD-10-CM

## 2013-03-20 DIAGNOSIS — E118 Type 2 diabetes mellitus with unspecified complications: Secondary | ICD-10-CM | POA: Diagnosis present

## 2013-03-20 DIAGNOSIS — R0602 Shortness of breath: Secondary | ICD-10-CM | POA: Diagnosis not present

## 2013-03-20 DIAGNOSIS — R0789 Other chest pain: Secondary | ICD-10-CM | POA: Diagnosis not present

## 2013-03-20 DIAGNOSIS — I2510 Atherosclerotic heart disease of native coronary artery without angina pectoris: Secondary | ICD-10-CM | POA: Diagnosis present

## 2013-03-20 DIAGNOSIS — H539 Unspecified visual disturbance: Secondary | ICD-10-CM | POA: Diagnosis not present

## 2013-03-20 DIAGNOSIS — Z9861 Coronary angioplasty status: Secondary | ICD-10-CM | POA: Diagnosis not present

## 2013-03-20 DIAGNOSIS — Z7901 Long term (current) use of anticoagulants: Secondary | ICD-10-CM | POA: Diagnosis not present

## 2013-03-20 DIAGNOSIS — Z86711 Personal history of pulmonary embolism: Secondary | ICD-10-CM | POA: Diagnosis present

## 2013-03-20 DIAGNOSIS — G4730 Sleep apnea, unspecified: Secondary | ICD-10-CM | POA: Diagnosis not present

## 2013-03-20 DIAGNOSIS — R072 Precordial pain: Secondary | ICD-10-CM | POA: Diagnosis not present

## 2013-03-20 LAB — CBC
HCT: 39 % (ref 39.0–52.0)
Hemoglobin: 13.6 g/dL (ref 13.0–17.0)
MCH: 31.8 pg (ref 26.0–34.0)
MCHC: 34.9 g/dL (ref 30.0–36.0)
MCV: 91.1 fL (ref 78.0–100.0)
Platelets: 193 10*3/uL (ref 150–400)
RBC: 4.28 MIL/uL (ref 4.22–5.81)
RDW: 12.7 % (ref 11.5–15.5)
WBC: 5.5 10*3/uL (ref 4.0–10.5)

## 2013-03-20 LAB — PRO B NATRIURETIC PEPTIDE: Pro B Natriuretic peptide (BNP): 34.7 pg/mL (ref 0–125)

## 2013-03-20 LAB — COMPREHENSIVE METABOLIC PANEL
ALT: 19 U/L (ref 0–53)
AST: 19 U/L (ref 0–37)
Albumin: 3.3 g/dL — ABNORMAL LOW (ref 3.5–5.2)
Alkaline Phosphatase: 70 U/L (ref 39–117)
BUN: 15 mg/dL (ref 6–23)
CO2: 30 mEq/L (ref 19–32)
Calcium: 8.9 mg/dL (ref 8.4–10.5)
Chloride: 100 mEq/L (ref 96–112)
Creatinine, Ser: 0.97 mg/dL (ref 0.50–1.35)
GFR calc Af Amer: >90 mL/min (ref >90)
GFR calc non Af Amer: 82 mL/min — ABNORMAL LOW (ref >90)
Glucose, Bld: 156 mg/dL — ABNORMAL HIGH (ref 70–99)
Potassium: 4.3 mEq/L (ref 3.7–5.3)
Sodium: 139 mEq/L (ref 137–147)
Total Bilirubin: 0.4 mg/dL (ref 0.3–1.2)
Total Protein: 6.2 g/dL (ref 6.0–8.3)

## 2013-03-20 LAB — APTT: aPTT: 29 seconds (ref 24–37)

## 2013-03-20 LAB — URINE MICROSCOPIC-ADD ON

## 2013-03-20 LAB — URINALYSIS, ROUTINE W REFLEX MICROSCOPIC
Bilirubin Urine: NEGATIVE
Glucose, UA: 500 mg/dL — AB
Hgb urine dipstick: NEGATIVE
Ketones, ur: NEGATIVE mg/dL
Nitrite: NEGATIVE
Protein, ur: NEGATIVE mg/dL
Specific Gravity, Urine: 1.027 (ref 1.005–1.030)
Urobilinogen, UA: 0.2 mg/dL (ref 0.0–1.0)
pH: 6.5 (ref 5.0–8.0)

## 2013-03-20 LAB — PROTIME-INR
INR: 1.33 (ref 0.00–1.49)
Prothrombin Time: 16.2 seconds — ABNORMAL HIGH (ref 11.6–15.2)

## 2013-03-20 LAB — TROPONIN I
Troponin I: <0.3 ng/mL (ref <0.30)
Troponin I: <0.3 ng/mL (ref <0.30)

## 2013-03-20 LAB — D-DIMER, QUANTITATIVE: D-Dimer, Quant: <0.27 ug/mL-FEU (ref 0.00–0.48)

## 2013-03-20 MED ORDER — RIVAROXABAN 20 MG PO TABS
20.0000 mg | ORAL_TABLET | Freq: Every day | ORAL | Status: DC
  Administered 2013-03-21: 20 mg via ORAL
  Filled 2013-03-20: qty 1

## 2013-03-20 MED ORDER — ATORVASTATIN CALCIUM 20 MG PO TABS
20.0000 mg | ORAL_TABLET | Freq: Every day | ORAL | Status: DC
  Administered 2013-03-20: 20 mg via ORAL
  Filled 2013-03-20 (×2): qty 1

## 2013-03-20 MED ORDER — DICLOFENAC SODIUM 1 % TD GEL: 2.0000 g | Freq: Three times a day (TID) | TRANSDERMAL | Status: DC | PRN

## 2013-03-20 MED ORDER — OXYCODONE HCL 5 MG PO TABS: 10.0000 mg | ORAL_TABLET | Freq: Four times a day (QID) | ORAL | Status: DC | PRN

## 2013-03-20 MED ORDER — DULOXETINE HCL 60 MG PO CPEP
60.0000 mg | ORAL_CAPSULE | Freq: Every day | ORAL | Status: DC
  Administered 2013-03-21: 60 mg via ORAL
  Filled 2013-03-20: qty 1

## 2013-03-20 MED ORDER — PREGABALIN 75 MG PO CAPS: 300.0000 mg | ORAL_CAPSULE | Freq: Two times a day (BID) | ORAL | Status: DC

## 2013-03-20 MED ORDER — NITROGLYCERIN 0.4 MG SL SUBL: 0.4000 mg | SUBLINGUAL_TABLET | SUBLINGUAL | Status: DC | PRN

## 2013-03-20 MED ORDER — INSULIN DETEMIR 100 UNIT/ML ~~LOC~~ SOLN
18.0000 [IU] | Freq: Every day | SUBCUTANEOUS | Status: DC
  Administered 2013-03-21: 18 [IU] via SUBCUTANEOUS
  Filled 2013-03-20: qty 0.18

## 2013-03-20 MED ORDER — RAMIPRIL 2.5 MG PO CAPS
2.5000 mg | ORAL_CAPSULE | Freq: Every evening | ORAL | Status: DC
  Administered 2013-03-20: 2.5 mg via ORAL
  Filled 2013-03-20 (×2): qty 1

## 2013-03-20 MED ORDER — REPAGLINIDE 2 MG PO TABS
2.0000 mg | ORAL_TABLET | Freq: Three times a day (TID) | ORAL | Status: DC
  Administered 2013-03-21 (×2): 2 mg via ORAL
  Filled 2013-03-20 (×4): qty 1

## 2013-03-20 MED ORDER — ASPIRIN EC 81 MG PO TBEC
81.0000 mg | DELAYED_RELEASE_TABLET | Freq: Every day | ORAL | Status: DC
Start: 2013-03-21 — End: 2013-03-21
  Administered 2013-03-21: 81 mg via ORAL
  Filled 2013-03-20: qty 1

## 2013-03-20 MED ORDER — FERROUS SULFATE 325 (65 FE) MG PO TABS
325.0000 mg | ORAL_TABLET | Freq: Every day | ORAL | Status: DC
  Administered 2013-03-21: 325 mg via ORAL
  Filled 2013-03-20 (×2): qty 1

## 2013-03-20 MED ORDER — METFORMIN HCL 500 MG PO TABS
1000.0000 mg | ORAL_TABLET | Freq: Two times a day (BID) | ORAL | Status: DC
  Administered 2013-03-21: 1000 mg via ORAL
  Filled 2013-03-20 (×3): qty 2

## 2013-03-20 MED ORDER — RIVAROXABAN 15 MG PO TABS: 15.0000 mg | ORAL_TABLET | Freq: Every day | ORAL | Status: DC

## 2013-03-20 MED ORDER — VALACYCLOVIR HCL 500 MG PO TABS
500.0000 mg | ORAL_TABLET | Freq: Every day | ORAL | Status: DC
  Administered 2013-03-21: 500 mg via ORAL
  Filled 2013-03-20: qty 1

## 2013-03-20 MED ORDER — INSULIN DETEMIR 100 UNIT/ML ~~LOC~~ SOLN: 18.0000 [IU] | Freq: Every day | SUBCUTANEOUS | Status: DC

## 2013-03-20 MED ORDER — ASPIRIN 81 MG PO CHEW
324.0000 mg | CHEWABLE_TABLET | Freq: Once | ORAL | Status: AC
  Administered 2013-03-20: 324 mg via ORAL
  Filled 2013-03-20: qty 4

## 2013-03-20 MED ORDER — PREGABALIN 75 MG PO CAPS
300.0000 mg | ORAL_CAPSULE | Freq: Two times a day (BID) | ORAL | Status: DC
  Administered 2013-03-20 – 2013-03-21 (×2): 300 mg via ORAL
  Filled 2013-03-20 (×2): qty 4

## 2013-03-20 MED ORDER — DUTASTERIDE 0.5 MG PO CAPS
0.5000 mg | ORAL_CAPSULE | Freq: Every day | ORAL | Status: DC
  Administered 2013-03-21: 0.5 mg via ORAL
  Filled 2013-03-20: qty 1

## 2013-03-20 NOTE — ED Provider Notes (Signed)
CSN: [id number]     Arrival date & time 03/20/13  1159 History   First MD Initiated Contact with Patient 03/20/13 1303     Chief Complaint  Patient presents with  . Chest Pain    Syncopal Episode     (Consider location/radiation/quality/duration/timing/severity/associated sxs/prior Treatment) The history is provided by the patient.   73-year-old gentleman followed by cardiology locally. Patient has chest pain frequently and is on nitroglycerin for that. Patient is also on is a role toe. Patient had a stent placed back in 2010. Patient went to pain management today developed chest pain and went to 5/10 in the office currently now is 3/10. Also patient got diaphoretic felt short of breath no nausea no vomiting and did have a syncopal episode. However bystanders helped him down he did not fall and have atraumatic injury. Chest pain is right substernal. Does not radiate to the back. This pain was more intense and what he normally gets.  Past Medical History  Diagnosis Date  . Diabetes mellitus   . Coronary artery disease   . Hypertension   . Stroke 2002, 2003  . History of TIAs   . Lumbago   . Benign localized hyperplasia of prostate without urinary obstruction and other lower urinary tract symptoms (LUTS)   . Other and unspecified hyperlipidemia   . Unspecified essential hypertension   . Type II or unspecified type diabetes mellitus without mention of complication, not stated as uncontrolled   . Personal history of unspecified circulatory disease   . Unspecified fall   . Hypersomnia with sleep apnea, unspecified   . Pain in joint, multiple sites   . Degeneration of cervical intervertebral disc   . Unspecified cardiovascular disease    Past Surgical History  Procedure Laterality Date  . Hernia repair    . Carpal tunnel release      bilateral  . Elbow surgery      right elbow - nerve release  . Cervical disc surgery    . Lungs      "fluid pumped off lungs"  . Coronary stent  placement  Feb. 2010  . Cardiac catheterization    . Coronary angioplasty     Family History  Problem Relation Age of Onset  . Aneurysm Mother   . Cancer Father    History  Substance Use Topics  . Smoking status: Never Smoker   . Smokeless tobacco: Never Used  . Alcohol Use: No    Review of Systems  Constitutional: Positive for diaphoresis. Negative for fever.  Eyes: Positive for visual disturbance.  Respiratory: Positive for shortness of breath.   Cardiovascular: Positive for chest pain.  Gastrointestinal: Negative for nausea, vomiting and abdominal pain.  Genitourinary: Negative for dysuria.  Musculoskeletal: Negative for back pain.  Skin: Negative for rash.  Neurological: Positive for syncope and light-headedness.  Hematological: Bruises/bleeds easily.  Psychiatric/Behavioral: Negative for confusion.      Allergies  Review of patient's allergies indicates no known allergies.  Home Medications   Current Outpatient Rx  Name  Route  Sig  Dispense  Refill  . Ascorbic Acid (VITAMIN C PO)   Oral   Take 1 tablet by mouth daily.         . cholecalciferol (VITAMIN D) 1000 UNITS tablet   Oral   Take 1,000 Units by mouth daily.         . diclofenac sodium (VOLTAREN) 1 % GEL   Topical   Apply 2 g topically 3 (three) times daily  as needed (for pain in left arm and shoulder).   1 Tube   5   . DULoxetine (CYMBALTA) 60 MG capsule   Oral   Take 60 mg by mouth daily.         Marland Kitchen dutasteride (AVODART) 0.5 MG capsule   Oral   Take 0.5 mg by mouth daily.         . ferrous sulfate 325 (65 FE) MG tablet   Oral   Take 325 mg by mouth daily with breakfast.         . furosemide (LASIX) 20 MG tablet   Oral   Take 20 mg by mouth daily as needed (swelling).          . insulin detemir (LEVEMIR) 100 UNIT/ML injection   Subcutaneous   Inject 18-25 Units into the skin daily. Based on sugar levels         . metFORMIN (GLUCOPHAGE) 500 MG tablet   Oral   Take  1,000 mg by mouth 2 (two) times daily with a meal.         . Multiple Vitamin (MULTIVITAMIN WITH MINERALS) TABS   Oral   Take 1 tablet by mouth every evening.          . nitroGLYCERIN (NITRODUR - DOSED IN MG/24 HR) 0.2 mg/hr patch   Transdermal   Place 0.2 mg onto the skin every 12 (twelve) hours.         . pregabalin (LYRICA) 300 MG capsule   Oral   Take 1 capsule (300 mg total) by mouth 2 (two) times daily.   180 capsule   [facility] Fax [phone]   . ramipril (ALTACE) 2.5 MG capsule   Oral   Take 2.5 mg by mouth every evening.          . repaglinide (PRANDIN) 2 MG tablet   Oral   Take 2 mg by mouth 3 (three) times daily before meals.         . Rivaroxaban (XARELTO) 15 MG TABS tablet   Oral   Take 15 mg by mouth daily. Dispense this dose for 21 days, thereafter her primary care physician.         . rosuvastatin (CRESTOR) 10 MG tablet   Oral   Take 10 mg by mouth every evening.          . tapentadol (NUCYNTA) 50 MG TABS   Oral   Take 50 mg by mouth 3 (three) times daily as needed (for pain).         . valACYclovir (VALTREX) 500 MG tablet   Oral   Take 500 mg by mouth daily.          BP 144/75  Pulse 62  Temp(Src) 97.9 F (36.6 C) (Oral)  Resp 18  SpO2 95% Physical Exam  Nursing note and vitals reviewed. Constitutional: He is oriented to person, place, and time. He appears well-developed and well-nourished. No distress.  HENT:  Head: Normocephalic and atraumatic.  Mouth/Throat: Oropharynx is clear and moist.  Eyes: Conjunctivae and EOM are normal. Pupils are equal, round, and reactive to light.  Neck: Normal range of motion.  Cardiovascular: Normal rate, regular rhythm and normal heart sounds.   No murmur heard. Pulmonary/Chest: Effort normal and breath sounds normal. No respiratory distress.  Abdominal: Soft. Bowel sounds are normal. There is no tenderness.  Musculoskeletal: Normal range of motion.  Neurological:  He is alert and oriented to  person, place, and time. No cranial nerve deficit. He exhibits normal muscle tone. Coordination normal.  Skin: Skin is warm. No rash noted.    ED Course  Procedures (including critical care time) Labs Review Labs Reviewed  COMPREHENSIVE METABOLIC PANEL - Abnormal; Notable for the following:    Glucose, Bld 156 (*)    Albumin 3.3 (*)    GFR calc non Af Amer 82 (*)    All other components within normal limits  PROTIME-INR - Abnormal; Notable for the following:    Prothrombin Time 16.2 (*)    All other components within normal limits  URINALYSIS, ROUTINE W REFLEX MICROSCOPIC - Abnormal; Notable for the following:    Glucose, UA 500 (*)    Leukocytes, UA TRACE (*)    All other components within normal limits  URINE MICROSCOPIC-ADD ON - Abnormal; Notable for the following:    Squamous Epithelial / LPF FEW (*)    All other components within normal limits  APTT  CBC  PRO B NATRIURETIC PEPTIDE  TROPONIN I  D-DIMER, QUANTITATIVE   Results for orders placed during the hospital encounter of 03/20/13  APTT      Result Value Ref Range   aPTT 29  24 - 37 seconds  CBC      Result Value Ref Range   WBC 5.5  4.0 - 10.5 K/uL   RBC 4.28  4.22 - 5.81 MIL/uL   Hemoglobin 13.6  13.0 - 17.0 g/dL   HCT 39.0  39.0 - 52.0 %   MCV 91.1  78.0 - 100.0 fL   MCH 31.8  26.0 - 34.0 pg   MCHC 34.9  30.0 - 36.0 g/dL   RDW 12.7  11.5 - 15.5 %   Platelets 193  150 - 400 K/uL  COMPREHENSIVE METABOLIC PANEL      Result Value Ref Range   Sodium 139  137 - 147 mEq/L   Potassium 4.3  3.7 - 5.3 mEq/L   Chloride 100  96 - 112 mEq/L   CO2 30  19 - 32 mEq/L   Glucose, Bld 156 (*) 70 - 99 mg/dL   BUN 15  6 - 23 mg/dL   Creatinine, Ser 0.97  0.50 - 1.35 mg/dL   Calcium 8.9  8.4 - 10.5 mg/dL   Total Protein 6.2  6.0 - 8.3 g/dL   Albumin 3.3 (*) 3.5 - 5.2 g/dL   AST 19  0 - 37 U/L   ALT 19  0 - 53 U/L   Alkaline Phosphatase 70  39 - 117 U/L   Total Bilirubin 0.4  0.3 - 1.2 mg/dL    GFR calc non Af Amer 82 (*) >90 mL/min   GFR calc Af Amer >90  >90 mL/min  PRO B NATRIURETIC PEPTIDE      Result Value Ref Range   Pro B Natriuretic peptide (BNP) 34.7  0 - 125 pg/mL  PROTIME-INR      Result Value Ref Range   Prothrombin Time 16.2 (*) 11.6 - 15.2 seconds   INR 1.33  0.00 - 1.49  TROPONIN I      Result Value Ref Range   Troponin I <0.30  <0.30 ng/mL  URINALYSIS, ROUTINE W REFLEX MICROSCOPIC      Result Value Ref Range   Color, Urine YELLOW  YELLOW   APPearance CLEAR  CLEAR   Specific Gravity, Urine 1.027  1.005 - 1.030   pH 6.5  5.0 - 8.0   Glucose, UA 500 (*)  NEGATIVE mg/dL   Hgb urine dipstick NEGATIVE  NEGATIVE   Bilirubin Urine NEGATIVE  NEGATIVE   Ketones, ur NEGATIVE  NEGATIVE mg/dL   Protein, ur NEGATIVE  NEGATIVE mg/dL   Urobilinogen, UA 0.2  0.0 - 1.0 mg/dL   Nitrite NEGATIVE  NEGATIVE   Leukocytes, UA TRACE (*) NEGATIVE  D-DIMER, QUANTITATIVE      Result Value Ref Range   D-Dimer, Quant <0.27  0.00 - 0.48 ug/mL-FEU  URINE MICROSCOPIC-ADD ON      Result Value Ref Range   Squamous Epithelial / LPF FEW (*) RARE   WBC, UA 0-2  <3 WBC/hpf   RBC / HPF 0-2  <3 RBC/hpf   Bacteria, UA RARE  RARE   Urine-Other MUCOUS PRESENT      Imaging Review Dg Chest 2 View  03/20/2013   CLINICAL DATA:  Chest pain with syncope today  EXAM: CHEST  2 VIEW  COMPARISON:  DG CHEST 2V dated 12/31/2012; DG CHEST 2 VIEW dated 10/08/2012  FINDINGS: Mild cardiac enlargement stable. Calcification of the aortic arch stable. Vascular pattern is normal. Lungs are clear.  IMPRESSION: No active cardiopulmonary disease.   Electronically Signed   By: Skipper Cliche M.D.   On: 03/20/2013 13:24   Ct Head Wo Contrast  03/20/2013   CLINICAL DATA:  HISTORY OF STROKE, CHEST PAIN FOLLOWUP BY SYNCOPE TODAY  EXAM: CT HEAD WITHOUT CONTRAST  TECHNIQUE: Contiguous axial images were obtained from the base of the skull through the vertex without intravenous contrast.  COMPARISON:  CT HEAD W/O CM  dated 11/08/2011; MR HEAD W/O CM dated 07/19/2011  FINDINGS: There is near complete opacification of the right sphenoid sinus. This represents a progression of inflammatory change that was seen on prior CT scan. There is mild diffuse atrophy and white matter hypo attenuation with small lacunar infarct left cerebellum. There is no evidence of acute vascular territory infarct. There is no hemorrhage, extra-axial fluid, or hydrocephalus.  IMPRESSION: Chronic sphenoid sinusitis.  No acute intracranial abnormalities.   Electronically Signed   By: Skipper Cliche M.D.   On: 03/20/2013 15:10     EKG Interpretation   Date/Time:  Thursday March 20 2013 12:10:06 EDT Ventricular Rate:  56 PR Interval:  156 QRS Duration: 92 QT Interval:  406 QTC Calculation: 392 R Axis:   -27 Text Interpretation:  Age not entered, assumed to be  73 years old for  purpose of ECG interpretation Sinus rhythm Borderline left axis deviation  RSR' in V1 or V2, right VCD or RVH Borderline ST elevation, anterior leads  No significant change was found Except some increase in St segment  elevation in anterior leads compared to 10/23/12 Confirmed by Irania Durell   MD, Stellah Donovan [phone]) on 03/20/2013 12:21:07 PM      MDM   Final diagnoses:  Chest pain  Syncope    Patient followed by cardiology. Patient had a stent placed in 2010. Patient was at pain management today. Diaphoretic had increased chest pain has syncopal episode. Patient's initial workup for the chest pain troponin negative d-dimer negative chest x-ray negative. Head CT negative. EKG without any acute changes. There was some slight increase in ST segment elevation in the anterior leads compared to EKG from 10/23/2012. Not consistent with STEMI.       Mervin Kung, MD 03/20/13 [phone]

## 2013-03-20 NOTE — H&P (Signed)
Pt. Seen and examined. Agree with the NP/PA-C note as written. Pleasant 77 you male with a history of CAD and LAD stent, recently non-ischemic on NST. He was started on a nitroglycerin patch a few weeks ago and has described some heaviness in his legs recently. Today, he was at the doctor's office for a spinal injection. While waiting (before being injected), he felt hot, then sweaty. He had some of his "typical chest pain", but it was somewhat worse on the right side, radiating to the left. He then had a syncopal episode. The next thing he remembers is waking up on the floor with people putting pillows under his head.  Impression: 1. Suspect vasovagal syncope, contributed by nitrates 2. Cannot r/o arrhythmogenic event, such as VT  Recommend: 1. Admit for observation. 2. Cycle cardiac enzymes.  3. Hold nitroglycerin patches 4. Check carotid dopplers for completeness 5. May need outpatient Event monitor after discharge.  Pixie Casino, MD, [facility] Attending Cardiologist [place]

## 2013-03-20 NOTE — H&P (Signed)
Cardiologist: [name] is an 73 y.o. male.   Chief Complaint:  Chest Pain/syncope HPI:  Steve Brown is a 73 y.o. male who had an LAD stent a few years ago. 2012 cath showed patent stent and no significant CAD.  He underwent a nuclear stress test on 10/23/12 which revealed EF of 65% and no ischemia.   His history also includes bilateral PE in June 2014 for which he takes Xarelto.  He has not missed any doses.  He reports being at the pain clinic for back injections today when he suddenly became diaphoretic, felt hot and developed CP(5/10).  He described it as "pressure" and was in the right to center of his chest.  No radiation, sob, nausea, palpitations.  He was prescribed 0.$RemoveBeforeDE'2mg'VnilOZaLdfguaoL$  NTG patches by Dr. Harrington Challenger previously.  He took it off thinking it may have decreased his BP.  He tried to stand up and felt light-headed.  The person next to him asked if he was ok and the next thing he remembered was clinic staff all around him. Yesterday he was at the gym walking around the track and felt more tired then usual.  The patient currently denies fever, orthopnea, PND, cough, congestion, abdominal pain, hematochezia, melena, lower extremity edema, claudication.    Medications:  Prior to Admission medications   Medication Sig Start Date End Date Taking? Authorizing Provider  Ascorbic Acid (VITAMIN C PO) Take 1 tablet by mouth daily.   Yes Historical Provider, MD  cholecalciferol (VITAMIN D) 1000 UNITS tablet Take 1,000 Units by mouth daily.   Yes Historical Provider, MD  diclofenac sodium (VOLTAREN) 1 % GEL Apply 2 g topically 3 (three) times daily as needed (for pain in left arm and shoulder). 07/26/12  Yes Carmen Dohmeier, MD  DULoxetine (CYMBALTA) 60 MG capsule Take 60 mg by mouth daily.   Yes Historical Provider, MD  dutasteride (AVODART) 0.5 MG capsule Take 0.5 mg by mouth daily.   Yes Historical Provider, MD  ferrous sulfate 325 (65 FE) MG tablet Take 325 mg by mouth daily with  breakfast.   Yes Historical Provider, MD  furosemide (LASIX) 20 MG tablet Take 20 mg by mouth daily as needed (swelling).    Yes Historical Provider, MD  insulin detemir (LEVEMIR) 100 UNIT/ML injection Inject 18-25 Units into the skin daily. Based on sugar levels   Yes Historical Provider, MD  metFORMIN (GLUCOPHAGE) 500 MG tablet Take 1,000 mg by mouth 2 (two) times daily with a meal.   Yes Historical Provider, MD  Multiple Vitamin (MULTIVITAMIN WITH MINERALS) TABS Take 1 tablet by mouth every evening.    Yes Historical Provider, MD  nitroGLYCERIN (NITRODUR - DOSED IN MG/24 HR) 0.2 mg/hr patch Place 0.2 mg onto the skin every 12 (twelve) hours.   Yes Historical Provider, MD  pregabalin (LYRICA) 300 MG capsule Take 1 capsule (300 mg total) by mouth 2 (two) times daily. 07/26/12  Yes Carmen Dohmeier, MD  ramipril (ALTACE) 2.5 MG capsule Take 2.5 mg by mouth every evening.    Yes Historical Provider, MD  repaglinide (PRANDIN) 2 MG tablet Take 2 mg by mouth 3 (three) times daily before meals.   Yes Historical Provider, MD  Rivaroxaban (XARELTO) 15 MG TABS tablet Take 15 mg by mouth daily. Dispense this dose for 21 days, thereafter her primary care physician. 07/02/12  Yes Thurnell Lose, MD  rosuvastatin (CRESTOR) 10 MG tablet Take 10 mg by mouth every evening.    Yes Historical Provider, MD  tapentadol (NUCYNTA) 50 MG TABS Take 50 mg by mouth 3 (three) times daily as needed (for pain).   Yes Historical Provider, MD  valACYclovir (VALTREX) 500 MG tablet Take 500 mg by mouth daily.   Yes Historical Provider, MD     Past Medical History  Diagnosis Date  . Diabetes mellitus   . Coronary artery disease   . Hypertension   . Stroke 2002, 2003  . History of TIAs   . Lumbago   . Benign localized hyperplasia of prostate without urinary obstruction and other lower urinary tract symptoms (LUTS)   . Other and unspecified hyperlipidemia   . Unspecified essential hypertension   . Type II or unspecified type  diabetes mellitus without mention of complication, not stated as uncontrolled   . Personal history of unspecified circulatory disease   . Unspecified fall   . Hypersomnia with sleep apnea, unspecified   . Pain in joint, multiple sites   . Degeneration of cervical intervertebral disc   . Unspecified cardiovascular disease     Past Surgical History  Procedure Laterality Date  . Hernia repair    . Carpal tunnel release      bilateral  . Elbow surgery      right elbow - nerve release  . Cervical disc surgery    . Lungs      "fluid pumped off lungs"  . Coronary stent placement  Feb. 2010  . Cardiac catheterization    . Coronary angioplasty      Family History  Problem Relation Age of Onset  . Aneurysm Mother   . Cancer Father    Social History:  reports that he has never smoked. He has never used smokeless tobacco. He reports that he does not drink alcohol or use illicit drugs.  Allergies: No Known Allergies   (Not in a hospital admission)  Results for orders placed during the hospital encounter of 03/20/13 (from the past 48 hour(s))  APTT     Status: None   Collection Time    03/20/13  1:00 PM      Result Value Ref Range   aPTT 29  24 - 37 seconds  CBC     Status: None   Collection Time    03/20/13  1:00 PM      Result Value Ref Range   WBC 5.5  4.0 - 10.5 K/uL   RBC 4.28  4.22 - 5.81 MIL/uL   Hemoglobin 13.6  13.0 - 17.0 g/dL   HCT 39.0  39.0 - 52.0 %   MCV 91.1  78.0 - 100.0 fL   MCH 31.8  26.0 - 34.0 pg   MCHC 34.9  30.0 - 36.0 g/dL   RDW 12.7  11.5 - 15.5 %   Platelets 193  150 - 400 K/uL  COMPREHENSIVE METABOLIC PANEL     Status: Abnormal   Collection Time    03/20/13  1:00 PM      Result Value Ref Range   Sodium 139  137 - 147 mEq/L   Potassium 4.3  3.7 - 5.3 mEq/L   Chloride 100  96 - 112 mEq/L   CO2 30  19 - 32 mEq/L   Glucose, Bld 156 (*) 70 - 99 mg/dL   BUN 15  6 - 23 mg/dL   Creatinine, Ser 0.97  0.50 - 1.35 mg/dL   Calcium 8.9  8.4 - 10.5  mg/dL   Total Protein 6.2  6.0 - 8.3 g/dL   Albumin 3.3 (*)  3.5 - 5.2 g/dL   AST 19  0 - 37 U/L   ALT 19  0 - 53 U/L   Alkaline Phosphatase 70  39 - 117 U/L   Total Bilirubin 0.4  0.3 - 1.2 mg/dL   GFR calc non Af Amer 82 (*) >90 mL/min   GFR calc Af Amer >90  >90 mL/min   Comment: (NOTE)     The eGFR has been calculated using the CKD EPI equation.     This calculation has not been validated in all clinical situations.     eGFR's persistently <90 mL/min signify possible Chronic Kidney     Disease.  PRO B NATRIURETIC PEPTIDE     Status: None   Collection Time    03/20/13  1:00 PM      Result Value Ref Range   Pro B Natriuretic peptide (BNP) 34.7  0 - 125 pg/mL  PROTIME-INR     Status: Abnormal   Collection Time    03/20/13  1:00 PM      Result Value Ref Range   Prothrombin Time 16.2 (*) 11.6 - 15.2 seconds   INR 1.33  0.00 - 1.49  TROPONIN I     Status: None   Collection Time    03/20/13  1:00 PM      Result Value Ref Range   Troponin I <0.30  <0.30 ng/mL   Comment:            Due to the release kinetics of cTnI,     a negative result within the first hours     of the onset of symptoms does not rule out     myocardial infarction with certainty.     If myocardial infarction is still suspected,     repeat the test at appropriate intervals.  D-DIMER, QUANTITATIVE     Status: None   Collection Time    03/20/13  1:08 PM      Result Value Ref Range   D-Dimer, Quant <0.27  0.00 - 0.48 ug/mL-FEU   Comment:            AT THE INHOUSE ESTABLISHED CUTOFF     VALUE OF 0.48 ug/mL FEU,     THIS ASSAY HAS BEEN DOCUMENTED     IN THE LITERATURE TO HAVE     A SENSITIVITY AND NEGATIVE     PREDICTIVE VALUE OF AT LEAST     98 TO 99%.  THE TEST RESULT     SHOULD BE CORRELATED WITH     AN ASSESSMENT OF THE CLINICAL     PROBABILITY OF DVT / VTE.  URINALYSIS, ROUTINE W REFLEX MICROSCOPIC     Status: Abnormal   Collection Time    03/20/13  2:55 PM      Result Value Ref Range   Color,  Urine YELLOW  YELLOW   APPearance CLEAR  CLEAR   Specific Gravity, Urine 1.027  1.005 - 1.030   pH 6.5  5.0 - 8.0   Glucose, UA 500 (*) NEGATIVE mg/dL   Hgb urine dipstick NEGATIVE  NEGATIVE   Bilirubin Urine NEGATIVE  NEGATIVE   Ketones, ur NEGATIVE  NEGATIVE mg/dL   Protein, ur NEGATIVE  NEGATIVE mg/dL   Urobilinogen, UA 0.2  0.0 - 1.0 mg/dL   Nitrite NEGATIVE  NEGATIVE   Leukocytes, UA TRACE (*) NEGATIVE  URINE MICROSCOPIC-ADD ON     Status: Abnormal   Collection Time    03/20/13  2:55 PM  Result Value Ref Range   Squamous Epithelial / LPF FEW (*) RARE   WBC, UA 0-2  <3 WBC/hpf   RBC / HPF 0-2  <3 RBC/hpf   Bacteria, UA RARE  RARE   Urine-Other MUCOUS PRESENT     Comment: TRICHOMONAS PRESENT   Dg Chest 2 View  03/20/2013   CLINICAL DATA:  Chest pain with syncope today  EXAM: CHEST  2 VIEW  COMPARISON:  DG CHEST 2V dated 12/31/2012; DG CHEST 2 VIEW dated 10/08/2012  FINDINGS: Mild cardiac enlargement stable. Calcification of the aortic arch stable. Vascular pattern is normal. Lungs are clear.  IMPRESSION: No active cardiopulmonary disease.   Electronically Signed   By: Skipper Cliche M.D.   On: 03/20/2013 13:24   Ct Head Wo Contrast  03/20/2013   CLINICAL DATA:  HISTORY OF STROKE, CHEST PAIN FOLLOWUP BY SYNCOPE TODAY  EXAM: CT HEAD WITHOUT CONTRAST  TECHNIQUE: Contiguous axial images were obtained from the base of the skull through the vertex without intravenous contrast.  COMPARISON:  CT HEAD W/O CM dated 11/08/2011; MR HEAD W/O CM dated 07/19/2011  FINDINGS: There is near complete opacification of the right sphenoid sinus. This represents a progression of inflammatory change that was seen on prior CT scan. There is mild diffuse atrophy and white matter hypo attenuation with small lacunar infarct left cerebellum. There is no evidence of acute vascular territory infarct. There is no hemorrhage, extra-axial fluid, or hydrocephalus.  IMPRESSION: Chronic sphenoid sinusitis.  No acute  intracranial abnormalities.   Electronically Signed   By: Skipper Cliche M.D.   On: 03/20/2013 15:10    Review of Systems  Constitutional: Positive for diaphoresis. Negative for fever.  HENT: Negative for congestion and sore throat.   Respiratory: Negative for cough and shortness of breath.   Cardiovascular: Positive for chest pain. Negative for palpitations, orthopnea, claudication, leg swelling and PND.  Gastrointestinal: Negative for nausea, vomiting, blood in stool and melena.  Genitourinary: Negative for hematuria.  Musculoskeletal: Negative for myalgias.  Neurological: Positive for dizziness and weakness.  All other systems reviewed and are negative.    Blood pressure 145/79, pulse 60, temperature 97.9 F (36.6 C), temperature source Oral, resp. rate 13, SpO2 98.00%. Physical Exam  Nursing note and vitals reviewed. Constitutional: He is oriented to person, place, and time. He appears well-developed and well-nourished. No distress.  HENT:  Head: Normocephalic and atraumatic.  Eyes: EOM are normal. Pupils are equal, round, and reactive to light.  Neck: Normal range of motion. Neck supple. No JVD present.  Cardiovascular: Normal rate, regular rhythm, S1 normal and S2 normal.   No murmur heard. Pulses:      Radial pulses are 2+ on the right side, and 2+ on the left side.       Dorsalis pedis pulses are 2+ on the right side, and 2+ on the left side.  No carotid bruit.  Respiratory: Effort normal and breath sounds normal. He has no wheezes. He has no rales. He exhibits no tenderness.  GI: Soft. Bowel sounds are normal. He exhibits no distension. There is no tenderness.  Musculoskeletal: He exhibits no edema (No LEE).  Lymphadenopathy:    He has no cervical adenopathy.  Neurological: He is alert and oriented to person, place, and time. He exhibits normal muscle tone.  Skin: Skin is warm and dry.  Psychiatric: He has a normal mood and affect.     Assessment/Plan Principal  Problem:   Syncope Active Problems:   Diabetes mellitus  type 2 with complications   CAD in native artery   History of recurrent TIAs   History of pulmonary embolism: June 2014,  Takes Xarelto   Hypertension   Chest pain  Plan:    6 male with history of CAD and stent to the LAD.  Cath in 2012 showed it was patent with 40-50% lesion proximal to the stent.  Normal EF.  NST on Oct 2014 was nonischemic with EF 65%.  Uncontrolled DM-Last Hgb A1C in June 2014 was 11.6.  He presents with syncope and CP.  BP at the clinic initially was 80/40.  CT head negative for intracranial abnormality and CXR shows no acute cardiopulmonary disease.  Glucose is 156.  D dimer negative.  Troponin negative thus far.  Albumin slightly low but other labs WNL.  EKG with J point elevation anteriorly but without acute changes compared to Sept 2014.  Could have had a vasovagal episode or perhaps ventricular arrhythmia causing hypoperfusion.  MI?  He is not hypoglycemic.  Will admit to telemetry to follow rhythm.  Monitor BP.  Cycle Cardiac enzymes.  Will hold PRN lasix.  Check orthostatic vital signs.  Steve Brown 03/20/2013, 5:08 PM

## 2013-03-21 ENCOUNTER — Other Ambulatory Visit: Payer: Self-pay | Admitting: Cardiology

## 2013-03-21 DIAGNOSIS — R55 Syncope and collapse: Secondary | ICD-10-CM | POA: Diagnosis not present

## 2013-03-21 DIAGNOSIS — I2510 Atherosclerotic heart disease of native coronary artery without angina pectoris: Secondary | ICD-10-CM | POA: Diagnosis not present

## 2013-03-21 DIAGNOSIS — R079 Chest pain, unspecified: Secondary | ICD-10-CM | POA: Diagnosis not present

## 2013-03-21 DIAGNOSIS — R0789 Other chest pain: Secondary | ICD-10-CM | POA: Diagnosis not present

## 2013-03-21 DIAGNOSIS — E118 Type 2 diabetes mellitus with unspecified complications: Secondary | ICD-10-CM

## 2013-03-21 DIAGNOSIS — Z8673 Personal history of transient ischemic attack (TIA), and cerebral infarction without residual deficits: Secondary | ICD-10-CM | POA: Diagnosis not present

## 2013-03-21 DIAGNOSIS — E119 Type 2 diabetes mellitus without complications: Secondary | ICD-10-CM | POA: Diagnosis not present

## 2013-03-21 LAB — TROPONIN I
Troponin I: <0.3 ng/mL (ref <0.30)
Troponin I: <0.3 ng/mL (ref <0.30)

## 2013-03-21 LAB — LIPID PANEL
Cholesterol: 144 mg/dL (ref 0–200)
HDL: 57 mg/dL (ref >39)
LDL Cholesterol: 63 mg/dL (ref 0–99)
Total CHOL/HDL Ratio: 2.5 RATIO
Triglycerides: 122 mg/dL (ref <150)
VLDL: 24 mg/dL (ref 0–40)

## 2013-03-21 LAB — BASIC METABOLIC PANEL
BUN: 15 mg/dL (ref 6–23)
CO2: 27 mEq/L (ref 19–32)
Calcium: 9.3 mg/dL (ref 8.4–10.5)
Chloride: 98 mEq/L (ref 96–112)
Creatinine, Ser: 1.23 mg/dL (ref 0.50–1.35)
GFR calc Af Amer: 67 mL/min — ABNORMAL LOW (ref >90)
GFR calc non Af Amer: 58 mL/min — ABNORMAL LOW (ref >90)
Glucose, Bld: 205 mg/dL — ABNORMAL HIGH (ref 70–99)
Potassium: 5 mEq/L (ref 3.7–5.3)
Sodium: 138 mEq/L (ref 137–147)

## 2013-03-21 LAB — HEMOGLOBIN A1C
Hgb A1c MFr Bld: 12.2 % — ABNORMAL HIGH (ref <5.7)
Mean Plasma Glucose: 303 mg/dL — ABNORMAL HIGH (ref <117)

## 2013-03-21 LAB — GLUCOSE, CAPILLARY
Glucose-Capillary: 214 mg/dL — ABNORMAL HIGH (ref 70–99)
Glucose-Capillary: 255 mg/dL — ABNORMAL HIGH (ref 70–99)

## 2013-03-21 LAB — TSH: TSH: 0.618 u[IU]/mL (ref 0.350–4.500)

## 2013-03-21 NOTE — Discharge Summary (Signed)
Physician Discharge Summary       Patient ID: Steve Brown MRN: [id number] DOB/AGE: 1940-10-18 73 y.o.  Admit date: 03/20/2013 Discharge date: 03/21/2013  Discharge Diagnoses:  Principal Problem:   Syncope, secondary to orthostatic hypotension Active Problems:   Diabetes mellitus type 2 with complications   CAD in native artery   History of recurrent TIAs   History of pulmonary embolism: June 2014,  Takes Xarelto   Hypertension   Chest pain   Discharged Condition: good  Procedures: none  Hospital Course:  73 y.o. male who had an LAD stent a few years ago. 2012 cath showed patent stent and no significant CAD. He underwent a nuclear stress test on 10/23/12 which revealed EF of 65% and no ischemia. His history also includes bilateral PE in June 2014 for which he takes Xarelto. He has not missed any doses. He presented to ER after being at the pain clinic for back injections today when he suddenly became diaphoretic, felt hot and developed CP(5/10). He described it as "pressure" and was in the right to center of his chest. No radiation, sob, nausea, palpitations. He was prescribed 0.2mg  NTG patches by Dr. Harrington Challenger previously. He took it off thinking it may have decreased his BP. He tried to stand up and felt light-headed. The person next to him asked if he was ok and the next thing he remembered was clinic staff all around him. Yesterday he was at the gym walking around the track and felt more tired then usual. The patient currently denies fever, orthopnea, PND, cough, congestion, abdominal pain, hematochezia, melena, lower extremity edema, claudication.   Orthostatic BPs Lying 119/72; sitting 112/74; standing 108/65  NTG patch had been removed.  Admitted for monitoring. Negative MI, no arrhthymias. 03/21/13 pt seen and evaluated by Dr. Acie Fredrickson who found pt stable and ready for discharge.  Plan will be outpt carotid dopplers and follow up with [name].  Pt ambulated in the hall without  complications or dizziness. NTG patch stopped.    Consults: None  Significant Diagnostic Studies:  BMET    Component Value Date/Time   NA 138 03/21/2013 0400   NA 140 08/14/2012 1513   K 5.0 03/21/2013 0400   K 4.3 08/14/2012 1513   CL 98 03/21/2013 0400   CO2 27 03/21/2013 0400   CO2 27 08/14/2012 1513   GLUCOSE 205* 03/21/2013 0400   GLUCOSE 262* 08/14/2012 1513   BUN 15 03/21/2013 0400   BUN 19.0 08/14/2012 1513   CREATININE 1.23 03/21/2013 0400   CREATININE 1.3 08/14/2012 1513   CALCIUM 9.3 03/21/2013 0400   CALCIUM 9.3 08/14/2012 1513   GFRNONAA 58* 03/21/2013 0400   GFRAA 67* 03/21/2013 0400    CBC    Component Value Date/Time   WBC 5.5 03/20/2013 1300   WBC 5.4 08/14/2012 1512   RBC 4.28 03/20/2013 1300   RBC 4.23 08/14/2012 1512   HGB 13.6 03/20/2013 1300   HGB 13.1 08/14/2012 1512   HCT 39.0 03/20/2013 1300   HCT 38.8 08/14/2012 1512   PLT 193 03/20/2013 1300   PLT 204 08/14/2012 1512   MCV 91.1 03/20/2013 1300   MCV 91.7 08/14/2012 1512   MCH 31.8 03/20/2013 1300   MCH 31.0 08/14/2012 1512   MCHC 34.9 03/20/2013 1300   MCHC 33.8 08/14/2012 1512   RDW 12.7 03/20/2013 1300   RDW 13.3 08/14/2012 1512   LYMPHSABS 2.1 08/14/2012 1512   LYMPHSABS 2.7 07/01/2012 0714   MONOABS 0.4 08/14/2012 1512  MONOABS 0.5 07/01/2012 0714   EOSABS 0.0 08/14/2012 1512   EOSABS 0.0 07/01/2012 0714   BASOSABS 0.0 08/14/2012 1512   BASOSABS 0.0 07/01/2012 0714    Troponin I <0.30 X 3 Pro BNP 34.7  Tchol 144, TG 122, HDL 57, LDL 63  DDimer <0.27 TSH 0.618 u/a clear  CT Head: There is near complete opacification of the right sphenoid sinus. This represents a progression of inflammatory change that was seen on prior CT scan. There is mild diffuse atrophy and white matter hypo attenuation with small lacunar infarct left cerebellum. There is no evidence of acute vascular territory infarct. There is no hemorrhage, extra-axial fluid, or hydrocephalus. IMPRESSION: Chronic sphenoid sinusitis. No acute intracranial  abnormalities.  2 View CXR: FINDINGS: Mild cardiac enlargement stable. Calcification of the aortic arch stable. Vascular pattern is normal. Lungs are clear. IMPRESSION: No active cardiopulmonary disease.     Discharge Exam: Blood pressure 109/68, pulse 71, temperature 98.2 F (36.8 C), temperature source Oral, resp. rate 20, SpO2 100.00%.   Disposition: 01-Home or Self Care       Future Appointments Provider Department Dept Phone   03/26/2013 12:00 PM [facility] [address] [phone]   04/24/2013 2:15 PM Casandra Doffing, MD [facility] [phone]   07/24/2013 3:00 PM Larey Seat, MD [facility] [phone]       Medication List    STOP taking these medications       nitroGLYCERIN 0.2 mg/hr patch  Commonly known as:  NITRODUR - Dosed in mg/24 hr      TAKE these medications       cholecalciferol 1000 UNITS tablet  Commonly known as:  VITAMIN D  Take 1,000 Units by mouth daily.     diclofenac sodium 1 % Gel  Commonly known as:  VOLTAREN  Apply 2 g topically 3 (three) times daily as needed (for pain in left arm and shoulder).     DULoxetine 60 MG capsule  Commonly known as:  CYMBALTA  Take 60 mg by mouth daily.     dutasteride 0.5 MG capsule  Commonly known as:  AVODART  Take 0.5 mg by mouth daily.     ferrous sulfate 325 (65 FE) MG tablet  Take 325 mg by mouth daily with breakfast.     furosemide 20 MG tablet  Commonly known as:  LASIX  Take 20 mg by mouth daily as needed (swelling).     insulin detemir 100 UNIT/ML injection  Commonly known as:  LEVEMIR  Inject 18-25 Units into the skin daily. If blood sugar is <150 give 18 units.  If blood sugar is 151-200 give 20 units.  If blood sugar is >200 give 25 units.     metFORMIN 500 MG tablet  Commonly known as:  GLUCOPHAGE  Take 1,000 mg by mouth 2 (two) times daily with a meal.     multivitamin with minerals Tabs tablet  Take 1  tablet by mouth every evening.     pregabalin 300 MG capsule  Commonly known as:  LYRICA  Take 1 capsule (300 mg total) by mouth 2 (two) times daily.     ramipril 2.5 MG capsule  Commonly known as:  ALTACE  Take 2.5 mg by mouth every evening.     repaglinide 2 MG tablet  Commonly known as:  PRANDIN  Take 2 mg by mouth 3 (three) times daily before meals.     rosuvastatin 10 MG tablet  Commonly known as:  CRESTOR  Take 10 mg by mouth every evening.     tapentadol 50 MG Tabs tablet  Commonly known as:  NUCYNTA  Take 50 mg by mouth 3 (three) times daily as needed (for pain).     valACYclovir 500 MG tablet  Commonly known as:  VALTREX  Take 500 mg by mouth daily.     VITAMIN C PO  Take 1 tablet by mouth daily.     XARELTO 20 MG Tabs tablet  Generic drug:  Rivaroxaban  Take 20 mg by mouth daily.       Follow-up Information   Follow up with Jettie Booze., MD On 03/24/2013. (as previously instructed)    Specialty:  Cardiology   Contact information:   [address] [place] [address] [phone]       Follow up with Jettie Booze., MD On 03/26/2013. ( at noon  at [facility] [address] office for carotid dopplers  )    Specialty:  Cardiology   Contact information:   [address] [address] [phone]        Discharge Instructions: Stop NTG patch  Heart Healthy Diabetic Diet.  Call if any further problems.   Signed: Isaiah Serge Nurse Practitioner-Certified [facility]: [facility] 03/21/2013, 12:23 PM  Time spent on discharge : >30 minutes.     Attending Note:   The patient was seen and examined.  Agree with assessment and plan as noted above.  Changes made to the above note as needed.  Please see my note from earlier today. Mr. Fehringer appears to be stable. I suspect his episode of syncope was due to orthostatic hypotension because of his nitroglycerin patch. The nitroglycerin patch  has been discontinued. He's feeling quite well. He'll followup with his primary cardiologist.  Ramond Dial., MD, [facility] 03/21/2013, 6:53 PM

## 2013-03-21 NOTE — Discharge Instructions (Signed)
Stop NTG patch  Heart Healthy Diabetic Diet.  Call if any further problems.

## 2013-03-21 NOTE — Progress Notes (Signed)
Pt discharged to home per MD order. Pt received and reviewed all discharge instructions and medication information including follow-up appointments and prescription information. Pt verbalized understanding. Pt alert and oriented at discharge with no complaints of pain. Pt IV and telemetry box removed prior to discharge. Pt escorted to private vehicle via wheelchair by nurse tech. Sadiya Durand C  

## 2013-03-21 NOTE — Progress Notes (Addendum)
PROGRESS NOTE  Subjective:   Steve Brown is a 73 y.o. Male  1. Hx of LAD stent a few years ago. 2012 cath showed patent stent and no significant CAD. He underwent a nuclear stress test on 10/23/12 which revealed EF of 65% and no ischemia. .  2.  bilateral PE in June 2014 for which he takes Xarelto. He has not missed any doses.   He reports being at the pain clinic for back injections today when he suddenly became diaphoretic, felt hot and developed CP(5/10). He described it as "pressure" and was in the right to center of his chest. No radiation, sob, nausea, palpitations. He was prescribed 0.2mg  NTG patches by Dr. Harrington Challenger previously. He took it off thinking it may have decreased his BP. He tried to stand up and felt light-headed. The person next to him asked if he was ok and the next thing he remembered was clinic staff all around him. Yesterday he was at the gym walking around the track and felt more tired then usual. The patient currently denies fever, orthopnea, PND, cough, congestion, abdominal pain, hematochezia, melena, lower extremity edema, claudication.   Objective:    Vital Signs:   Temp:  [97.9 F (36.6 C)-98.2 F (36.8 C)] 98.2 F (36.8 C) (03/13 0622) Pulse Rate:  [53-102] 71 (03/13 0622) Resp:  [9-24] 20 (03/12 1900) BP: (108-167)/(63-92) 109/68 mmHg (03/13 0627) SpO2:  [95 %-100 %] 100 % (03/13 0622)  Last BM Date: 03/21/13   24-hour weight change: Weight change:   Weight trends: There were no vitals filed for this visit.  Intake/Output:  03/12 0701 - 03/13 0700 In: 255 [P.O.:255] Out: -      Physical Exam: BP 109/68  Pulse 71  Temp(Src) 98.2 F (36.8 C) (Oral)  Resp 20  SpO2 100%  Wt Readings from Last 3 Encounters:  03/12/13 165 lb (74.844 kg)  10/23/12 159 lb (72.122 kg)  10/15/12 160 lb (72.576 kg)    General: Vital signs reviewed and noted.   Head: Normocephalic, atraumatic.  Eyes: conjunctivae/corneas clear.  EOM's intact.     Throat: normal  Neck:  normal  Lungs:    clear  Heart:  RR, normal s1s2  Abdomen:  Soft, non-tender, non-distended    Extremities: No edema   Neurologic: A&O X3, CN II - XII are grossly intact.   Psych: Normal     Labs: BMET:  Recent Labs  03/20/13 1300 03/21/13 0400  NA 139 138  K 4.3 5.0  CL 100 98  CO2 30 27  GLUCOSE 156* 205*  BUN 15 15  CREATININE 0.97 1.23  CALCIUM 8.9 9.3    Liver function tests:  Recent Labs  03/20/13 1300  AST 19  ALT 19  ALKPHOS 70  BILITOT 0.4  PROT 6.2  ALBUMIN 3.3*   No results found for this basename: LIPASE, AMYLASE,  in the last 72 hours  CBC:  Recent Labs  03/20/13 1300  WBC 5.5  HGB 13.6  HCT 39.0  MCV 91.1  PLT 193    Cardiac Enzymes:  Recent Labs  03/20/13 1300 03/20/13 2245 03/21/13 0400 03/21/13 0729  TROPONINI <0.30 <0.30 <0.30 <0.30    Coagulation Studies:  Recent Labs  03/20/13 1300  LABPROT 16.2*  INR 1.33    Other: No components found with this basename: POCBNP,   Recent Labs  03/20/13 1308  DDIMER <0.27   No results found for this basename: HGBA1C,  in the last 72  hours  Recent Labs  03/21/13 0400  CHOL 144  HDL 57  LDLCALC 63  TRIG 122  CHOLHDL 2.5    Recent Labs  03/20/13 2245  TSH 0.618   No results found for this basename: VITAMINB12, FOLATE, FERRITIN, TIBC, IRON, RETICCTPCT,  in the last 72 hours   Other results:  Tele-  NSR    Medications:    Infusions:    Scheduled Medications: . aspirin EC  81 mg Oral Daily  . atorvastatin  20 mg Oral q1800  . DULoxetine  60 mg Oral Daily  . dutasteride  0.5 mg Oral Daily  . ferrous sulfate  325 mg Oral Q breakfast  . insulin detemir  18 Units Subcutaneous Daily  . metFORMIN  1,000 mg Oral BID WC  . pregabalin  300 mg Oral BID  . ramipril  2.5 mg Oral QPM  . repaglinide  2 mg Oral TID AC  . rivaroxaban  20 mg Oral Daily  . valACYclovir  500 mg Oral Daily    Assessment/ Plan:   Principal Problem:    Syncope Active Problems:   Diabetes mellitus type 2 with complications   CAD in native artery   History of recurrent TIAs   History of pulmonary embolism: June 2014,  Takes Xarelto   Hypertension   Chest pain  1. Syncope: The patient's symptoms are consistent with orthostatic hypotension. This corresponds to the fact that he was just started on a nitroglycerin patch a week before. His electrolytes are normal. Glucose is a little elevated-2 of 5 to We'll discontinue the nitroglycerin patch. He feels quite well. He has been up ambulating and has not had any symptoms. He is orthostatic blood pressure measurements were negative.  He Is scheduled for carotid duplex scan but I think it we can delay this and get this in the office next week. There is no reason to keep him in the hospital for elective carotid duplex scanning.  2. CAD: He is stable.  He has ruled out for myocardial infarction. His LDL is 63. His HDL is 57.  3. diabetes mellitus: His glucose is slightly elevated. Continue with his current dose of Glucophage. He'll followup with his medical Dr.  4. Hyperlipidemia: his lipids are very favorable. Continue with the current dose of Crestor 10 mg a day  5. History of pulmonary embolus: Continue Xarelto   He is ready for discharge. We have discontinued the nitroglycerin patch. Follow up with [name] in  The office.   Disposition: Length of Stay: 1  Thayer Headings, Brooke Bonito., MD, [facility] 03/21/2013, 11:39 AM Office [phone] Pager [phone]   1

## 2013-03-26 ENCOUNTER — Encounter (HOSPITAL_COMMUNITY): Payer: Medicare Other

## 2013-04-01 ENCOUNTER — Encounter: Payer: Self-pay | Admitting: Nurse Practitioner

## 2013-04-01 ENCOUNTER — Ambulatory Visit (INDEPENDENT_AMBULATORY_CARE_PROVIDER_SITE_OTHER): Payer: Medicare Other | Admitting: Nurse Practitioner

## 2013-04-01 VITALS — BP 130/70 | HR 69 | Ht 65.0 in | Wt 166.4 lb

## 2013-04-01 DIAGNOSIS — I2510 Atherosclerotic heart disease of native coronary artery without angina pectoris: Secondary | ICD-10-CM | POA: Diagnosis not present

## 2013-04-01 DIAGNOSIS — R079 Chest pain, unspecified: Secondary | ICD-10-CM

## 2013-04-01 NOTE — Progress Notes (Signed)
Steve Brown Date of Birth: Dec 27, 1940 Medical Record #[id number]  History of Present Illness: Steve Brown is seen back today for a post hospital visit. Seen for [name]. He has known CAD with past LAD stent. Was cathed in 2012 which showed patent stent and no significant CAD otherwise. Negative Myoview in October of 2014. Other problems include bilateral PE since June of 2014 - on Xarelto, type 2 DM, recurrent TIAs, and HTN.  Most recently admitted with chest pain. He had been to the pain clinic for back injections. Tried to stand up and got orthostatic - near syncope. He had recently been given NTG patch - these were stopped at the time of this admission. Negative admission. Was to have outpatient carotid doppler and evaluation with follow up with [name].  Comes back today. Here alone. Appointment has been moved several times. He notes that for about 2 weeks prior to this recent admission that he had started having chest discomfort described as pressure that would "pass thru". Last for 10 to 15 minutes. The day that he was at the pain clinic his chest felt sore. Since his discharge, he has continued to have chest pressure - comes and goes - comes with and without exertion - lasts still for 10 to 15 minutes. Feels drained with the spells. Has been put on PPI therapy - really can't tell a difference. Not checking his blood pressure at home. Diabetes is not controlled - A1C over 12.  While he has done better since he has gotten further out from his PE, he has had a change in symptoms that necessitated getting NTG patches.   Current Outpatient Prescriptions  Medication Sig Dispense Refill  . Ascorbic Acid (VITAMIN C PO) Take 1 tablet by mouth daily.      . cholecalciferol (VITAMIN D) 1000 UNITS tablet Take 1,000 Units by mouth daily.      . diclofenac sodium (VOLTAREN) 1 % GEL Apply 2 g topically 3 (three) times daily as needed (for pain in left arm and shoulder).  1 Tube  5  .  DULoxetine (CYMBALTA) 60 MG capsule Take 60 mg by mouth daily.      Marland Kitchen dutasteride (AVODART) 0.5 MG capsule Take 0.5 mg by mouth daily.      . ferrous sulfate 325 (65 FE) MG tablet Take 325 mg by mouth daily with breakfast.      . furosemide (LASIX) 20 MG tablet Take 20 mg by mouth daily as needed (swelling).       . insulin detemir (LEVEMIR) 100 UNIT/ML injection Inject 18-25 Units into the skin daily. If blood sugar is <150 give 18 units.  If blood sugar is 151-200 give 20 units.  If blood sugar is >200 give 25 units.      . metFORMIN (GLUCOPHAGE) 500 MG tablet Take 1,000 mg by mouth 2 (two) times daily with a meal.      . Multiple Vitamin (MULTIVITAMIN WITH MINERALS) TABS Take 1 tablet by mouth every evening.       . pantoprazole (PROTONIX) 40 MG tablet Take 40 mg by mouth daily.      . pregabalin (LYRICA) 300 MG capsule Take 1 capsule (300 mg total) by mouth 2 (two) times daily.  180 capsule  3  . ramipril (ALTACE) 2.5 MG capsule Take 2.5 mg by mouth every evening.       . repaglinide (PRANDIN) 2 MG tablet Take 2 mg by mouth 3 (three) times daily before meals.      Marland Kitchen  Rivaroxaban (XARELTO) 20 MG TABS tablet Take 20 mg by mouth daily.      . rosuvastatin (CRESTOR) 10 MG tablet Take 10 mg by mouth every evening.       . tapentadol (NUCYNTA) 50 MG TABS Take 50 mg by mouth 3 (three) times daily as needed (for pain).      . valACYclovir (VALTREX) 500 MG tablet Take 500 mg by mouth daily.       No current facility-administered medications for this visit.    No Known Allergies  Past Medical History  Diagnosis Date  . Diabetes mellitus   . Coronary artery disease   . Hypertension   . Stroke 2002, 2003  . History of TIAs   . Lumbago   . Benign localized hyperplasia of prostate without urinary obstruction and other lower urinary tract symptoms (LUTS)   . Other and unspecified hyperlipidemia   . Unspecified essential hypertension   . Type II or unspecified type diabetes mellitus without  mention of complication, not stated as uncontrolled   . Personal history of unspecified circulatory disease   . Unspecified fall   . Hypersomnia with sleep apnea, unspecified   . Pain in joint, multiple sites   . Degeneration of cervical intervertebral disc   . Unspecified cardiovascular disease     Past Surgical History  Procedure Laterality Date  . Hernia repair    . Carpal tunnel release      bilateral  . Elbow surgery      right elbow - nerve release  . Cervical disc surgery    . Lungs      "fluid pumped off lungs"  . Coronary stent placement  Feb. 2010  . Cardiac catheterization    . Coronary angioplasty      History  Smoking status  . Never Smoker   Smokeless tobacco  . Never Used    History  Alcohol Use No    Family History  Problem Relation Age of Onset  . Aneurysm Mother   . Cancer Father     Review of Systems: The review of systems is per the HPI.  All other systems were reviewed and are negative.  Physical Exam: BP 130/70  Pulse 69  Ht 5' 5" (1.651 m)  Wt 166 lb 6.4 oz (75.479 kg)  BMI 27.69 kg/m2 Patient is very pleasant and in no acute distress. Skin is warm and dry. Color is normal.  HEENT is unremarkable. Normocephalic/atraumatic. PERRL. Sclera are nonicteric. Neck is supple. No masses. No JVD. Lungs are clear. Cardiac exam shows a regular rate and rhythm. Abdomen is soft. Extremities are without edema. Gait and ROM are intact. No gross neurologic deficits noted.  LABORATORY DATA: EKG today shows sinus rhythm - it is normal -  tracing reviewed with Dr. Varanasi.   Lab Results  Component Value Date   WBC 5.5 03/20/2013   HGB 13.6 03/20/2013   HCT 39.0 03/20/2013   PLT 193 03/20/2013   GLUCOSE 205* 03/21/2013   CHOL 144 03/21/2013   TRIG 122 03/21/2013   HDL 57 03/21/2013   LDLCALC 63 03/21/2013   ALT 19 03/20/2013   AST 19 03/20/2013   NA 138 03/21/2013   K 5.0 03/21/2013   CL 98 03/21/2013   CREATININE 1.23 03/21/2013   BUN 15 03/21/2013   CO2 27  03/21/2013   TSH 0.618 03/20/2013   INR 1.33 03/20/2013   HGBA1C 12.2* 03/20/2013   Carotid Doppler Summary: Findings suggest 1-39% internal carotid artery stenosis   bilaterally. The right vertebral arteryis patent with antegrade flow.   Echo Study Conclusions from 2013 - Left ventricle: The cavity size was normal. There was mild focal basal hypertrophy of the septum. Systolic function was normal. The estimated ejection fraction was in the range of 60% to 65%. Wall motion was normal; there were no regional wall motion abnormalities. Doppler parameters are consistent with abnormal left ventricular relaxation (grade 1 diastolic dysfunction). - Aortic valve: Trivial regurgitation. - Tricuspid valve: Moderate regurgitation. - Pulmonary arteries: Systolic pressure was mildly increased. PA peak pressure: 34mm Hg (S).   CARDIAC CATH IMPRESSION:  1. Widely patent left anterior descending coronary artery stent, mild-  to-moderate mid left anterior descending coronary artery disease.  2. Normal left ventricular function.  3. No abdominal aortic aneurysm or renal artery stenosis.  4. Normal hemodynamics.  RECOMMENDATIONS: Continue aggressive medical therapy. The patient will  likely be able to be discharged later today.  Jayadeep S Varanasi, MD  JSV/MEDQ D: 06/10/2010 T: 06/10/2010 Job: [id number]   Assessment / Plan: 1. CAD - prior stent - continues to have chest pain somewhat atypical but has had negative stress test last fall - - do not feel we can use nitrates - may need further testing - need to talk with Dr. Varanasi. He agrees - will plan for repeat cardiac cath. Procedure discussed with the patient by Dr. Varanasi and he is willing to proceed.   2. Near syncope - supposedly from the NTG patch - he has had some transient dizziness yesterday - will not restart.   3. DM - uncontrolled  4. HTN - BP ok today  5. Pulmonary emboli - committed to life long anticoagulation with  Xarelto.  Will check labs today. Arrange for cardiac cath with Dr. Varanasi  Patient is agreeable to this plan and will call if any problems develop in the interim.   Dimitrios Balestrieri C. Tyan Dy, RN, ANP-C [facility] [address] [place], [place]  [address] [phone]   

## 2013-04-01 NOTE — Patient Instructions (Addendum)
Do NOT take anymore Xarelto  We will arrange for a heart catheterization Thursday afternoon with [name]  We will check lab today  You are scheduled for a cardiac catheterization on Thursday afternoon,  March 26th with [name] or associate.  Go to [facility] 2nd Floor Short Stay on Thursday, March 26th at 11:30am.  Enter thru the [employer] entrance A No food or drink after midnight on Wednesday. You may take your medications with a sip of water on the day of your procedure except no more Xarelto.   Only take 1/2 dose of your insulin on Wednesday night and no insulin on Thursday morning.  Coronary Angiography Coronary angiography is an X-ray procedure used to look at the arteries in the heart. In this procedure, a dye (contrast dye) is injected through a long, hollow tube (catheter). The catheter is about the size of a piece of cooked spaghetti and is inserted through your groin, wrist, or arm. The dye is injected into each artery, and X-rays are then taken to show if there is a blockage in the arteries of your heart. LET [facility] CARE PROVIDER KNOW ABOUT:  Any allergies you have, including allergies to shellfish or contrast dye.   All medicines you are taking, including vitamins, herbs, eye drops, creams, and over-the-counter medicines.   Previous problems you or members of your family have had with the use of anesthetics.   Any blood disorders you have.   Previous surgeries you have had.  History of kidney problems or failure.   Other medical conditions you have. RISKS AND COMPLICATIONS  Generally, coronary angiography is a safe procedure. However, as with any procedure, complications can occur. Possible complications include:  Allergic reaction to the dye.  Bleeding from the access site or other locations.  Kidney injury, especially in people with impaired kidney function.  Stroke (rare).  Heart attack (rare). BEFORE THE PROCEDURE   Do not eat or  drink anything after midnight the night before the procedure, or as directed by your health care provider.   Ask your health care provider if it is okay to take any needed medicines with a sip of water.  PROCEDURE  You may be given a medicine to help you relax (sedative) before the procedure. This medicine is given through an intravenous (IV) access tube that is inserted into one of your veins.   The area where the catheter will be inserted is washed and shaved. This is usually done in the groin but may be done in the fold of your arm (near your elbow) or in the wrist.   A medicine will be given to numb the area where the catheter will be inserted (local anesthetic).   The health care provider will insert the catheter into an artery. The catheter is guided by using a special type of X-ray (fluoroscopy) of the blood vessel being examined.   A special dye is then injected into the catheter, and X-rays are taken. The dye helps to show where any narrowing or blockages are located in the heart arteries.  AFTER THE PROCEDURE   If the procedure is done through the leg, you will be kept in bed lying flat for several hours. You will be instructed to not bend or cross your legs.  The insertion site will be checked frequently.   The pulse in your feet or wrist will be checked frequently.   Additional blood tests, X-rays, and an electrocardiogram may be done.   You may  need to stay in the hospital overnight for observation.  Document Released: 07/02/2002 Document Revised: 08/28/2012 Document Reviewed: 05/20/2012 [facility] Patient Information 2014 [place].

## 2013-04-02 LAB — CBC
HCT: 39.5 % (ref 39.0–52.0)
Hemoglobin: 13.1 g/dL (ref 13.0–17.0)
MCHC: 33.1 g/dL (ref 30.0–36.0)
MCV: 94.6 fl (ref 78.0–100.0)
Platelets: 164 10*3/uL (ref 150.0–400.0)
RBC: 4.18 Mil/uL — ABNORMAL LOW (ref 4.22–5.81)
RDW: 13.7 % (ref 11.5–14.6)
WBC: 6.1 10*3/uL (ref 4.5–10.5)

## 2013-04-02 LAB — BASIC METABOLIC PANEL
BUN: 14 mg/dL (ref 6–23)
CO2: 31 mEq/L (ref 19–32)
Calcium: 9.2 mg/dL (ref 8.4–10.5)
Chloride: 100 mEq/L (ref 96–112)
Creatinine, Ser: 1.1 mg/dL (ref 0.4–1.5)
GFR: 81.44 mL/min (ref >60.00)
Glucose, Bld: 273 mg/dL — ABNORMAL HIGH (ref 70–99)
Potassium: 4.8 mEq/L (ref 3.5–5.1)
Sodium: 137 mEq/L (ref 135–145)

## 2013-04-02 LAB — APTT: aPTT: 25.3 s (ref 21.7–28.8)

## 2013-04-02 LAB — PROTIME-INR
INR: 1.4 ratio — ABNORMAL HIGH (ref 0.8–1.0)
Prothrombin Time: 15.1 s — ABNORMAL HIGH (ref 10.2–12.4)

## 2013-04-03 ENCOUNTER — Ambulatory Visit (HOSPITAL_COMMUNITY)
Admission: RE | Admit: 2013-04-03 | Discharge: 2013-04-03 | Disposition: A | Discharge status: Home / Self Care | Payer: Medicare Other | Source: Ambulatory Visit | Attending: Interventional Cardiology | Admitting: Interventional Cardiology

## 2013-04-03 ENCOUNTER — Encounter (HOSPITAL_COMMUNITY)
Admission: RE | Disposition: A | Discharge status: Home / Self Care | Payer: Medicare Other | Source: Ambulatory Visit | Attending: Interventional Cardiology

## 2013-04-03 DIAGNOSIS — Z9861 Coronary angioplasty status: Secondary | ICD-10-CM | POA: Insufficient documentation

## 2013-04-03 DIAGNOSIS — I6529 Occlusion and stenosis of unspecified carotid artery: Secondary | ICD-10-CM | POA: Insufficient documentation

## 2013-04-03 DIAGNOSIS — N400 Enlarged prostate without lower urinary tract symptoms: Secondary | ICD-10-CM | POA: Insufficient documentation

## 2013-04-03 DIAGNOSIS — I2510 Atherosclerotic heart disease of native coronary artery without angina pectoris: Secondary | ICD-10-CM

## 2013-04-03 DIAGNOSIS — R079 Chest pain, unspecified: Secondary | ICD-10-CM

## 2013-04-03 DIAGNOSIS — M545 Low back pain, unspecified: Secondary | ICD-10-CM | POA: Insufficient documentation

## 2013-04-03 DIAGNOSIS — R0789 Other chest pain: Secondary | ICD-10-CM | POA: Insufficient documentation

## 2013-04-03 DIAGNOSIS — I10 Essential (primary) hypertension: Secondary | ICD-10-CM | POA: Insufficient documentation

## 2013-04-03 DIAGNOSIS — E119 Type 2 diabetes mellitus without complications: Secondary | ICD-10-CM | POA: Diagnosis not present

## 2013-04-03 DIAGNOSIS — G4730 Sleep apnea, unspecified: Secondary | ICD-10-CM | POA: Diagnosis not present

## 2013-04-03 DIAGNOSIS — G4710 Hypersomnia, unspecified: Secondary | ICD-10-CM | POA: Insufficient documentation

## 2013-04-03 DIAGNOSIS — E785 Hyperlipidemia, unspecified: Secondary | ICD-10-CM | POA: Insufficient documentation

## 2013-04-03 DIAGNOSIS — Z7901 Long term (current) use of anticoagulants: Secondary | ICD-10-CM | POA: Insufficient documentation

## 2013-04-03 DIAGNOSIS — M5030 Other cervical disc degeneration, unspecified cervical region: Secondary | ICD-10-CM | POA: Diagnosis not present

## 2013-04-03 DIAGNOSIS — Z8673 Personal history of transient ischemic attack (TIA), and cerebral infarction without residual deficits: Secondary | ICD-10-CM | POA: Insufficient documentation

## 2013-04-03 DIAGNOSIS — Z86711 Personal history of pulmonary embolism: Secondary | ICD-10-CM | POA: Insufficient documentation

## 2013-04-03 HISTORY — PX: LEFT HEART CATHETERIZATION WITH CORONARY ANGIOGRAM: SHX5451

## 2013-04-03 LAB — GLUCOSE, CAPILLARY
Glucose-Capillary: 139 mg/dL — ABNORMAL HIGH (ref 70–99)
Glucose-Capillary: 115 mg/dL — ABNORMAL HIGH (ref 70–99)

## 2013-04-03 LAB — POCT ACTIVATED CLOTTING TIME: Activated Clotting Time: 332 seconds

## 2013-04-03 SURGERY — LEFT HEART CATHETERIZATION WITH CORONARY ANGIOGRAM
Anesthesia: LOCAL

## 2013-04-03 MED ORDER — FENTANYL CITRATE 0.05 MG/ML IJ SOLN
INTRAMUSCULAR | Status: AC
  Filled 2013-04-03: qty 2

## 2013-04-03 MED ORDER — LIDOCAINE HCL (PF) 1 % IJ SOLN
INTRAMUSCULAR | Status: AC
  Filled 2013-04-03: qty 30

## 2013-04-03 MED ORDER — DIAZEPAM 5 MG PO TABS
5.0000 mg | ORAL_TABLET | ORAL | Status: AC
  Administered 2013-04-03: 5 mg via ORAL
  Filled 2013-04-03: qty 1

## 2013-04-03 MED ORDER — SODIUM CHLORIDE 0.9 % IV SOLN: 250.0000 mL | INTRAVENOUS | Status: DC | PRN

## 2013-04-03 MED ORDER — MIDAZOLAM HCL 2 MG/2ML IJ SOLN
INTRAMUSCULAR | Status: AC
  Filled 2013-04-03: qty 2

## 2013-04-03 MED ORDER — HEPARIN (PORCINE) IN NACL 2-0.9 UNIT/ML-% IJ SOLN
INTRAMUSCULAR | Status: AC
  Filled 2013-04-03: qty 1000

## 2013-04-03 MED ORDER — NITROGLYCERIN 0.4 MG SL SUBL: 0.4000 mg | SUBLINGUAL_TABLET | SUBLINGUAL | Status: DC | PRN

## 2013-04-03 MED ORDER — NITROGLYCERIN 0.4 MG SL SUBL
0.4000 mg | SUBLINGUAL_TABLET | SUBLINGUAL | Status: DC | PRN
  Filled 2013-04-03: qty 25

## 2013-04-03 MED ORDER — NITROGLYCERIN 0.4 MG SL SUBL
SUBLINGUAL_TABLET | SUBLINGUAL | Status: AC
  Administered 2013-04-03: 0.4 mg
  Filled 2013-04-03: qty 1

## 2013-04-03 MED ORDER — BIVALIRUDIN 250 MG IV SOLR
INTRAVENOUS | Status: AC
  Filled 2013-04-03: qty 250

## 2013-04-03 MED ORDER — NITROGLYCERIN 0.2 MG/ML ON CALL CATH LAB
INTRAVENOUS | Status: AC
  Filled 2013-04-03: qty 1

## 2013-04-03 MED ORDER — SODIUM CHLORIDE 0.9 % IJ SOLN: 3.0000 mL | INTRAMUSCULAR | Status: DC | PRN

## 2013-04-03 MED ORDER — METFORMIN HCL 500 MG PO TABS: 1000.0000 mg | ORAL_TABLET | Freq: Two times a day (BID) | ORAL | Status: DC

## 2013-04-03 MED ORDER — RIVAROXABAN 20 MG PO TABS: 20.0000 mg | ORAL_TABLET | Freq: Every day | ORAL | Status: DC

## 2013-04-03 MED ORDER — SODIUM CHLORIDE 0.9 % IV SOLN: 1.0000 mL/kg/h | INTRAVENOUS | Status: DC

## 2013-04-03 MED ORDER — HEPARIN SODIUM (PORCINE) 1000 UNIT/ML IJ SOLN
INTRAMUSCULAR | Status: AC
  Filled 2013-04-03: qty 1

## 2013-04-03 MED ORDER — ASPIRIN 81 MG PO CHEW
CHEWABLE_TABLET | ORAL | Status: AC
  Filled 2013-04-03: qty 1

## 2013-04-03 MED ORDER — SODIUM CHLORIDE 0.9 % IJ SOLN: 3.0000 mL | Freq: Two times a day (BID) | INTRAMUSCULAR | Status: DC

## 2013-04-03 MED ORDER — SODIUM CHLORIDE 0.9 % IV SOLN: INTRAVENOUS | Status: DC

## 2013-04-03 MED ORDER — ADENOSINE 12 MG/4ML IV SOLN
16.0000 mL | Freq: Once | INTRAVENOUS | Status: DC
  Filled 2013-04-03: qty 16

## 2013-04-03 MED ORDER — ASPIRIN 81 MG PO CHEW
81.0000 mg | CHEWABLE_TABLET | ORAL | Status: AC
  Administered 2013-04-03: 81 mg via ORAL

## 2013-04-03 MED ORDER — VERAPAMIL HCL 2.5 MG/ML IV SOLN
INTRAVENOUS | Status: AC
  Filled 2013-04-03: qty 2

## 2013-04-03 NOTE — Progress Notes (Signed)
STATES NTG MADE SLEEPY

## 2013-04-03 NOTE — H&P (View-Only) (Signed)
Steve Brown Date of Birth: Dec 27, 1940 Medical Record #[id number]  History of Present Illness: Steve Brown is seen back today for a post hospital visit. Seen for [name]. He has known CAD with past LAD stent. Was cathed in 2012 which showed patent stent and no significant CAD otherwise. Negative Myoview in October of 2014. Other problems include bilateral PE since June of 2014 - on Xarelto, type 2 DM, recurrent TIAs, and HTN.  Most recently admitted with chest pain. He had been to the pain clinic for back injections. Tried to stand up and got orthostatic - near syncope. He had recently been given NTG patch - these were stopped at the time of this admission. Negative admission. Was to have outpatient carotid doppler and evaluation with follow up with [name].  Comes back today. Here alone. Appointment has been moved several times. He notes that for about 2 weeks prior to this recent admission that he had started having chest discomfort described as pressure that would "pass thru". Last for 10 to 15 minutes. The day that he was at the pain clinic his chest felt sore. Since his discharge, he has continued to have chest pressure - comes and goes - comes with and without exertion - lasts still for 10 to 15 minutes. Feels drained with the spells. Has been put on PPI therapy - really can't tell a difference. Not checking his blood pressure at home. Diabetes is not controlled - A1C over 12.  While he has done better since he has gotten further out from his PE, he has had a change in symptoms that necessitated getting NTG patches.   Current Outpatient Prescriptions  Medication Sig Dispense Refill  . Ascorbic Acid (VITAMIN C PO) Take 1 tablet by mouth daily.      . cholecalciferol (VITAMIN D) 1000 UNITS tablet Take 1,000 Units by mouth daily.      . diclofenac sodium (VOLTAREN) 1 % GEL Apply 2 g topically 3 (three) times daily as needed (for pain in left arm and shoulder).  1 Tube  5  .  DULoxetine (CYMBALTA) 60 MG capsule Take 60 mg by mouth daily.      Marland Kitchen dutasteride (AVODART) 0.5 MG capsule Take 0.5 mg by mouth daily.      . ferrous sulfate 325 (65 FE) MG tablet Take 325 mg by mouth daily with breakfast.      . furosemide (LASIX) 20 MG tablet Take 20 mg by mouth daily as needed (swelling).       . insulin detemir (LEVEMIR) 100 UNIT/ML injection Inject 18-25 Units into the skin daily. If blood sugar is <150 give 18 units.  If blood sugar is 151-200 give 20 units.  If blood sugar is >200 give 25 units.      . metFORMIN (GLUCOPHAGE) 500 MG tablet Take 1,000 mg by mouth 2 (two) times daily with a meal.      . Multiple Vitamin (MULTIVITAMIN WITH MINERALS) TABS Take 1 tablet by mouth every evening.       . pantoprazole (PROTONIX) 40 MG tablet Take 40 mg by mouth daily.      . pregabalin (LYRICA) 300 MG capsule Take 1 capsule (300 mg total) by mouth 2 (two) times daily.  180 capsule  3  . ramipril (ALTACE) 2.5 MG capsule Take 2.5 mg by mouth every evening.       . repaglinide (PRANDIN) 2 MG tablet Take 2 mg by mouth 3 (three) times daily before meals.      Marland Kitchen  Rivaroxaban (XARELTO) 20 MG TABS tablet Take 20 mg by mouth daily.      . rosuvastatin (CRESTOR) 10 MG tablet Take 10 mg by mouth every evening.       . tapentadol (NUCYNTA) 50 MG TABS Take 50 mg by mouth 3 (three) times daily as needed (for pain).      . valACYclovir (VALTREX) 500 MG tablet Take 500 mg by mouth daily.       No current facility-administered medications for this visit.    No Known Allergies  Past Medical History  Diagnosis Date  . Diabetes mellitus   . Coronary artery disease   . Hypertension   . Stroke 2002, 2003  . History of TIAs   . Lumbago   . Benign localized hyperplasia of prostate without urinary obstruction and other lower urinary tract symptoms (LUTS)   . Other and unspecified hyperlipidemia   . Unspecified essential hypertension   . Type II or unspecified type diabetes mellitus without  mention of complication, not stated as uncontrolled   . Personal history of unspecified circulatory disease   . Unspecified fall   . Hypersomnia with sleep apnea, unspecified   . Pain in joint, multiple sites   . Degeneration of cervical intervertebral disc   . Unspecified cardiovascular disease     Past Surgical History  Procedure Laterality Date  . Hernia repair    . Carpal tunnel release      bilateral  . Elbow surgery      right elbow - nerve release  . Cervical disc surgery    . Lungs      "fluid pumped off lungs"  . Coronary stent placement  Feb. 2010  . Cardiac catheterization    . Coronary angioplasty      History  Smoking status  . Never Smoker   Smokeless tobacco  . Never Used    History  Alcohol Use No    Family History  Problem Relation Age of Onset  . Aneurysm Mother   . Cancer Father     Review of Systems: The review of systems is per the HPI.  All other systems were reviewed and are negative.  Physical Exam: BP 130/70  Pulse 69  Ht 5\' 5"  (1.651 m)  Wt 166 lb 6.4 oz (75.479 kg)  BMI 27.69 kg/m2 Patient is very pleasant and in no acute distress. Skin is warm and dry. Color is normal.  HEENT is unremarkable. Normocephalic/atraumatic. PERRL. Sclera are nonicteric. Neck is supple. No masses. No JVD. Lungs are clear. Cardiac exam shows a regular rate and rhythm. Abdomen is soft. Extremities are without edema. Gait and ROM are intact. No gross neurologic deficits noted.  LABORATORY DATA: EKG today shows sinus rhythm - it is normal -  tracing reviewed with [name].   Lab Results  Component Value Date   WBC 5.5 03/20/2013   HGB 13.6 03/20/2013   HCT 39.0 03/20/2013   PLT 193 03/20/2013   GLUCOSE 205* 03/21/2013   CHOL 144 03/21/2013   TRIG 122 03/21/2013   HDL 57 03/21/2013   LDLCALC 63 03/21/2013   ALT 19 03/20/2013   AST 19 03/20/2013   NA 138 03/21/2013   K 5.0 03/21/2013   CL 98 03/21/2013   CREATININE 1.23 03/21/2013   BUN 15 03/21/2013   CO2 27  03/21/2013   TSH 0.618 03/20/2013   INR 1.33 03/20/2013   HGBA1C 12.2* 03/20/2013   Carotid Doppler Summary: Findings suggest 1-39% internal carotid artery stenosis  bilaterally. The right vertebral arteryis patent with antegrade flow.   Echo Study Conclusions from 2013 - Left ventricle: The cavity size was normal. There was mild focal basal hypertrophy of the septum. Systolic function was normal. The estimated ejection fraction was in the range of 60% to 65%. Wall motion was normal; there were no regional wall motion abnormalities. Doppler parameters are consistent with abnormal left ventricular relaxation (grade 1 diastolic dysfunction). - Aortic valve: Trivial regurgitation. - Tricuspid valve: Moderate regurgitation. - Pulmonary arteries: Systolic pressure was mildly increased. PA peak pressure: 78mm Hg (S).   CARDIAC CATH IMPRESSION:  1. Widely patent left anterior descending coronary artery stent, mild-  to-moderate mid left anterior descending coronary artery disease.  2. Normal left ventricular function.  3. No abdominal aortic aneurysm or renal artery stenosis.  4. Normal hemodynamics.  RECOMMENDATIONS: Continue aggressive medical therapy. The patient will  likely be able to be discharged later today.  Jettie Booze, MD  JSV/MEDQ D: 06/10/2010 T: 06/10/2010 Job: [id number]   Assessment / Plan: 1. CAD - prior stent - continues to have chest pain somewhat atypical but has had negative stress test last fall - - do not feel we can use nitrates - may need further testing - need to talk with [name]. He agrees - will plan for repeat cardiac cath. Procedure discussed with the patient by [name] and he is willing to proceed.   2. Near syncope - supposedly from the NTG patch - he has had some transient dizziness yesterday - will not restart.   3. DM - uncontrolled  4. HTN - BP ok today  5. Pulmonary emboli - committed to life long anticoagulation with  Xarelto.  Will check labs today. Arrange for cardiac cath with [name]  Patient is agreeable to this plan and will call if any problems develop in the interim.   Burtis Junes, RN, [facility] [address] [place], [place]  [address] [phone]

## 2013-04-03 NOTE — Interval H&P Note (Signed)
Cath Lab Visit (complete for each Cath Lab visit)  Clinical Evaluation Leading to the Procedure:   ACS: no  Non-ACS:    Anginal Classification: CCS IV  Anti-ischemic medical therapy: No Therapy  Non-Invasive Test Results: Low-risk stress test findings: cardiac mortality <1%/year  Prior CABG: No previous CABG      History and Physical Interval Note:  04/03/2013 2:58 PM  Steve Brown  has presented today for surgery, with the diagnosis of Undtable Angina  The various methods of treatment have been discussed with the patient and family. After consideration of risks, benefits and other options for treatment, the patient has consented to  Procedure(s): LEFT HEART CATHETERIZATION WITH CORONARY ANGIOGRAM (N/A) as a surgical intervention .  The patient's history has been reviewed, patient examined, no change in status, stable for surgery.  I have reviewed the patient's chart and labs.  Questions were answered to the patient's satisfaction.     Stephani Janak S.

## 2013-04-03 NOTE — CV Procedure (Addendum)
PROCEDURE:  Left heart catheterization with selective coronary angiography, left ventriculogram.  FFR mid circumflex. Right subclavian angiogram, right innominate artery angiogram.  INDICATIONS:   Chest pain  The risks, benefits, and details of the procedure were explained to the patient.  The patient verbalized understanding and wanted to proceed.  Informed written consent was obtained.  PROCEDURE TECHNIQUE:  After Xylocaine anesthesia a 44F sheath was placed in the right radial artery. There was significant tortuosity at the right subclavian/ right innominate arteries. We performed angiograms from both vessels respectively using hand injection of contrast through the JR 4 catheter.  Were only able to advance the JR 4 catheter up to the innominate over a first core wire. There was too much resistance to advance the wire into the aorta. Please see below for details.  Access was obtained in the right femoral artery with a single anterior needle wall stick after lidocaine anesthesia.   Left coronary angiography was done using a Judkins L3.5 guide catheter.  Right coronary angiography was done using a Judkins R4 guide catheter.  Left ventriculography was done using a pigtail catheter. The FFR was done using a JL 4.0 guiding catheter. Please see below for details. A right femoral angiogram was performed. A 6 French Angio-Seal was placed in the right femoral artery for hemostasis. A TR band was used for the right radial artery sheath.   CONTRAST:  Total of 110 cc.  COMPLICATIONS:  None.    HEMODYNAMICS:  Aortic pressure was 144/75; LV pressure was 142/5; LVEDP 12.  There was no gradient between the left ventricle and aorta.    ANGIOGRAPHIC DATA:   The left main coronary artery is absent. There appear to be separate ostia of the LAD and circumflex.  The left anterior descending artery is a large vessel which wraps around the apex. There is mild disease in the mid vessel before the previously placed  stent. The stent appears widely patent. There several small to medium-sized diagonals which are patent. The first diagonal has a 40% proximal stenosis.  The left circumflex artery is a large vessel. The first obtuse marginal is small but patent. The second obtuse marginal is large and widely patent. After the second obtuse marginal, there is a focal 50-60% stenosis. It appears worse in some views. The third obtuse marginal is medium size and widely patent.  The right coronary artery is a large dominant vessel. There is mild atherosclerosis in the mid vessel. The posterior descending artery is large and widely patent. Posterior lateral artery is medium size and widely patent.  Right subclavian: The right common carotid artery right vertebral artery originate and appear patent proximally. There is significant tortuosity.  Right innominate: After the catheter is advanced to the innominate, another angiogram was performed. This revealed that the left common carotid originates from the innominate.  LEFT VENTRICULOGRAM:  Left ventricular angiogram was done in the 30 RAO projection and revealed normal left ventricular wall motion and systolic function with an estimated ejection fraction of 50 %.  LVEDP was 12 mmHg.  Interventional narrative: A JR 4 guiding catheter was used to engage the left circumflex. IV Angiomax was used for anticoagulation. A pressure wire was placed across the area disease in the circumflex. Resting FFR was 0.99. After adenosine the FFR was 0.93. Is thought to be insignificant for ischemia.  The procedure was stopped at that point. The Angio-Seal was deployed in his right groin for hemostasis.  IMPRESSIONS:  1. Apparently absent left main  coronary artery. There appear to be separate ostia of the left anterior descending and left circumflex. 2. Patent stent in the mid left anterior descending artery.  Mild disease in the remainder of the LAD and its branches. 3. Moderate focal  lesion in the mid left circumflex artery.  FFR of this lesion was negative for ischemia. No significant disease in the remainder of the left circumflex and its branches. 4. Widely patent right coronary artery. 5. Normal left ventricular systolic function.  LVEDP 12 mmHg.  Ejection fraction 50%. 6.   Significant tortuosity in the right subclavian/innominate area. Would not attempt radial cath from the right radial in the future. If radial approach is necessary, would try left radial.  RECOMMENDATION:  Continue medical therapy. He needs aggressive diabetes control. It appears that his chest discomfort is noncardiac. If his symptoms persist, will try to increase medical therapy. He will resume his Xarelto in 2 days.  Marland Kitchen

## 2013-04-03 NOTE — Progress Notes (Signed)
C/O 2/10 CHEST HEAVINESS AND JENNIFER,RN NOTIFIED AND ORDERS NOTED; EKG DONE, O2 AT 2L/MIN; NTG GIVEN WITH RELIEF OF CHEST HEAVINESS

## 2013-04-03 NOTE — Discharge Instructions (Signed)
Angiography, Care After Refer to this sheet in the next few weeks. These instructions provide you with information on caring for yourself after your procedure. Your health care provider may also give you more specific instructions. Your treatment has been planned according to current medical practices, but problems sometimes occur. Call your health care provider if you have any problems or questions after your procedure.  WHAT TO EXPECT AFTER THE PROCEDURE After your procedure, it is typical to have the following sensations:  Minor discomfort or tenderness and a small bump at the catheter insertion site. The bump should usually decrease in size and tenderness within 1 to 2 weeks.  Any bruising will usually fade within 2 to 4 weeks. HOME CARE INSTRUCTIONS   You may need to keep taking blood thinners if they were prescribed for you. Only take over-the-counter or prescription medicines for pain, fever, or discomfort as directed by your health care provider.  Do not apply powder or lotion to the site.  Do not sit in a bathtub, swimming pool, or whirlpool for 5 to 7 days.  You may shower 24 hours after the procedure. Remove the bandage (dressing) and gently wash the site with plain soap and water. Gently pat the site dry.  Inspect the site at least twice daily.  Limit your activity for the first 48 hours. Do not bend, squat, or lift anything over 20 lb (9 kg) or as directed by your health care provider.  Do not drive home if you are discharged the day of the procedure. Have someone else drive you. Follow instructions about when you can drive or return to work. SEEK MEDICAL CARE IF:  You get lightheaded when standing up.  You have drainage (other than a small amount of blood on the dressing).  You have chills.  You have a fever.  You have redness, warmth, swelling, or pain at the insertion site. SEEK IMMEDIATE MEDICAL CARE IF:   You develop chest pain or shortness of breath, feel faint,  or pass out.  You have bleeding, swelling larger than a walnut, or drainage from the catheter insertion site.  You develop pain, discoloration, coldness, or severe bruising in the leg or arm that held the catheter.  You develop bleeding from any other place, such as the bowels. You may see bright red blood in your urine or stools, or your stools may appear black and tarry.  You have heavy bleeding from the site. If this happens, hold pressure on the site. MAKE SURE YOU:  Understand these instructions.  Will watch your condition.  Will get help right away if you are not doing well or get worse. Document Released: 07/14/2004 Document Revised: 08/28/2012 Document Reviewed: 05/20/2012 [facility] Patient Information 2014 [place]. Radial Site Care Refer to this sheet in the next few weeks. These instructions provide you with information on caring for yourself after your procedure. Your caregiver may also give you more specific instructions. Your treatment has been planned according to current medical practices, but problems sometimes occur. Call your caregiver if you have any problems or questions after your procedure. HOME CARE INSTRUCTIONS  You may shower the day after the procedure.Remove the bandage (dressing) and gently wash the site with plain soap and water.Gently pat the site dry.  Do not apply powder or lotion to the site.  Do not submerge the affected site in water for 3 to 5 days.  Inspect the site at least twice daily.  Do not flex or bend the affected  arm for 24 hours.  No lifting over 5 pounds (2.3 kg) for 5 days after your procedure.  Do not drive home if you are discharged the same day of the procedure. Have someone else drive you.  You may drive 24 hours after the procedure unless otherwise instructed by your caregiver.  Do not operate machinery or power tools for 24 hours.  A responsible adult should be with you for the first 24 hours after you arrive  home. What to expect:  Any bruising will usually fade within 1 to 2 weeks.  Blood that collects in the tissue (hematoma) may be painful to the touch. It should usually decrease in size and tenderness within 1 to 2 weeks. SEEK IMMEDIATE MEDICAL CARE IF:  You have unusual pain at the radial site.  You have redness, warmth, swelling, or pain at the radial site.  You have drainage (other than a small amount of blood on the dressing).  You have chills.  You have a fever or persistent symptoms for more than 72 hours.  You have a fever and your symptoms suddenly get worse.  Your arm becomes pale, cool, tingly, or numb.  You have heavy bleeding from the site. Hold pressure on the site. Document Released: 01/28/2010 Document Revised: 03/20/2011 Document Reviewed: 01/28/2010 [facility] Patient Information 2014 [place], [place].

## 2013-04-03 NOTE — Interval H&P Note (Signed)
Cath Lab Visit (complete for each Cath Lab visit)  Clinical Evaluation Leading to the Procedure:   ACS: no  Non-ACS:    Anginal Classification: CCS IV  Anti-ischemic medical therapy: No Therapy  Non-Invasive Test Results: Low-risk stress test findings: cardiac mortality <1%/year  Prior CABG: No previous CABG  Recurrent rest pain.  Syncope with sustained release nitrates.    History and Physical Interval Note:  04/03/2013 3:01 PM  Steve Brown  has presented today for surgery, with the diagnosis of Undtable Angina  The various methods of treatment have been discussed with the patient and family. After consideration of risks, benefits and other options for treatment, the patient has consented to  Procedure(s): LEFT HEART CATHETERIZATION WITH CORONARY ANGIOGRAM (N/A) as a surgical intervention .  The patient's history has been reviewed, patient examined, no change in status, stable for surgery.  I have reviewed the patient's chart and labs.  Questions were answered to the patient's satisfaction.     Steve Brown S.

## 2013-04-04 MED FILL — Sodium Chloride IV Soln 0.9%: INTRAVENOUS | Qty: 50 | Status: AC

## 2013-04-07 ENCOUNTER — Ambulatory Visit: Payer: Medicare Other | Admitting: Physician Assistant

## 2013-04-09 DIAGNOSIS — Z0000 Encounter for general adult medical examination without abnormal findings: Secondary | ICD-10-CM | POA: Diagnosis not present

## 2013-04-09 DIAGNOSIS — E782 Mixed hyperlipidemia: Secondary | ICD-10-CM | POA: Diagnosis not present

## 2013-04-09 DIAGNOSIS — I10 Essential (primary) hypertension: Secondary | ICD-10-CM | POA: Diagnosis not present

## 2013-04-09 DIAGNOSIS — Z23 Encounter for immunization: Secondary | ICD-10-CM | POA: Diagnosis not present

## 2013-04-09 DIAGNOSIS — IMO0001 Reserved for inherently not codable concepts without codable children: Secondary | ICD-10-CM | POA: Diagnosis not present

## 2013-04-09 DIAGNOSIS — G609 Hereditary and idiopathic neuropathy, unspecified: Secondary | ICD-10-CM | POA: Diagnosis not present

## 2013-04-09 DIAGNOSIS — E559 Vitamin D deficiency, unspecified: Secondary | ICD-10-CM | POA: Diagnosis not present

## 2013-04-09 DIAGNOSIS — E1149 Type 2 diabetes mellitus with other diabetic neurological complication: Secondary | ICD-10-CM | POA: Diagnosis not present

## 2013-04-10 DIAGNOSIS — IMO0001 Reserved for inherently not codable concepts without codable children: Secondary | ICD-10-CM | POA: Diagnosis not present

## 2013-04-10 DIAGNOSIS — E559 Vitamin D deficiency, unspecified: Secondary | ICD-10-CM | POA: Diagnosis not present

## 2013-04-10 DIAGNOSIS — Z0000 Encounter for general adult medical examination without abnormal findings: Secondary | ICD-10-CM | POA: Diagnosis not present

## 2013-04-10 DIAGNOSIS — E782 Mixed hyperlipidemia: Secondary | ICD-10-CM | POA: Diagnosis not present

## 2013-04-10 DIAGNOSIS — I10 Essential (primary) hypertension: Secondary | ICD-10-CM | POA: Diagnosis not present

## 2013-04-10 DIAGNOSIS — G609 Hereditary and idiopathic neuropathy, unspecified: Secondary | ICD-10-CM | POA: Diagnosis not present

## 2013-04-10 DIAGNOSIS — Z23 Encounter for immunization: Secondary | ICD-10-CM | POA: Diagnosis not present

## 2013-04-10 DIAGNOSIS — E1149 Type 2 diabetes mellitus with other diabetic neurological complication: Secondary | ICD-10-CM | POA: Diagnosis not present

## 2013-04-15 ENCOUNTER — Ambulatory Visit: Payer: Medicare Other | Admitting: Interventional Cardiology

## 2013-04-20 ENCOUNTER — Other Ambulatory Visit: Payer: Self-pay | Admitting: Neurology

## 2013-04-24 ENCOUNTER — Encounter: Payer: Self-pay | Admitting: Interventional Cardiology

## 2013-04-24 ENCOUNTER — Ambulatory Visit (INDEPENDENT_AMBULATORY_CARE_PROVIDER_SITE_OTHER): Payer: Medicare Other | Admitting: Interventional Cardiology

## 2013-04-24 VITALS — BP 130/60 | HR 66 | Ht 65.0 in | Wt 164.0 lb

## 2013-04-24 DIAGNOSIS — Z86711 Personal history of pulmonary embolism: Secondary | ICD-10-CM

## 2013-04-24 DIAGNOSIS — I2510 Atherosclerotic heart disease of native coronary artery without angina pectoris: Secondary | ICD-10-CM | POA: Diagnosis not present

## 2013-04-24 DIAGNOSIS — E782 Mixed hyperlipidemia: Secondary | ICD-10-CM | POA: Insufficient documentation

## 2013-04-24 DIAGNOSIS — I10 Essential (primary) hypertension: Secondary | ICD-10-CM | POA: Diagnosis not present

## 2013-04-24 NOTE — Progress Notes (Signed)
Patient ID: Steve Brown, male   DOB: [birth year], 73 y.o.   MRN: [id number]    [place], [address] [place], [place]  [address] Phone: [phone] Fax:  [phone]  Date:  04/24/2013   ID:  Steve Brown, DOB 09/17/1940, MRN [id number]  PCP:  Gus Height, MD      History of Present Illness: Steve Brown is a 73 y.o. male who had an LAD stent in 2010. 2012 cath showed patent stent and no significant CAD. He had bilateral PE in 7/14.  He has been on Xarelto ever since.  He had a cath showing moderate disease which was negative by FFR.  He still has CP, nonexertional, that starts in the neck and goes to the chest.  He had neck surgery in 2004.  He thinks it is related to this.    Wt Readings from Last 3 Encounters:  04/24/13 164 lb (74.39 kg)  04/03/13 166 lb (75.297 kg)  04/03/13 166 lb (75.297 kg)     Past Medical History  Diagnosis Date  . Diabetes mellitus   . Coronary artery disease   . Hypertension   . Stroke 2002, 2003  . History of TIAs   . Lumbago   . Benign localized hyperplasia of prostate without urinary obstruction and other lower urinary tract symptoms (LUTS)   . Other and unspecified hyperlipidemia   . Unspecified essential hypertension   . Type II or unspecified type diabetes mellitus without mention of complication, not stated as uncontrolled   . Personal history of unspecified circulatory disease   . Unspecified fall   . Hypersomnia with sleep apnea, unspecified   . Pain in joint, multiple sites   . Degeneration of cervical intervertebral disc   . Unspecified cardiovascular disease     Current Outpatient Prescriptions  Medication Sig Dispense Refill  . Ascorbic Acid (VITAMIN C PO) Take 1 tablet by mouth daily.      . cholecalciferol (VITAMIN D) 1000 UNITS tablet Take 1,000 Units by mouth daily.      . DULoxetine (CYMBALTA) 60 MG capsule Take 60 mg by mouth daily.      Marland Kitchen dutasteride (AVODART) 0.5 MG capsule Take 0.5 mg by mouth daily.        . ferrous sulfate 325 (65 FE) MG tablet Take 325 mg by mouth daily with breakfast.      . furosemide (LASIX) 20 MG tablet Take 20 mg by mouth daily as needed (swelling).       . insulin detemir (LEVEMIR) 100 UNIT/ML injection Inject 18-25 Units into the skin daily. If blood sugar is <150 give 18 units.  If blood sugar is 151-200 give 20 units.  If blood sugar is >200 give 25 units.      . metFORMIN (GLUCOPHAGE) 500 MG tablet Take 2 tablets (1,000 mg total) by mouth 2 (two) times daily with a meal.      . Multiple Vitamin (MULTIVITAMIN WITH MINERALS) TABS Take 1 tablet by mouth every evening.       . nitroGLYCERIN (NITROSTAT) 0.4 MG SL tablet Place 1 tablet (0.4 mg total) under the tongue every 5 (five) minutes as needed for chest pain.  25 tablet  12  . pantoprazole (PROTONIX) 40 MG tablet Take 40 mg by mouth daily.      . pregabalin (LYRICA) 300 MG capsule Take 1 capsule (300 mg total) by mouth 2 (two) times daily.  180 capsule  3  . ramipril (ALTACE) 2.5 MG capsule Take 2.5  mg by mouth every evening.       . repaglinide (PRANDIN) 2 MG tablet Take 2 mg by mouth 3 (three) times daily before meals.      . Rivaroxaban (XARELTO) 20 MG TABS tablet Take 1 tablet (20 mg total) by mouth daily with supper.  30 tablet    . rosuvastatin (CRESTOR) 10 MG tablet Take 10 mg by mouth every evening.       . tapentadol (NUCYNTA) 50 MG TABS Take 50 mg by mouth 3 (three) times daily as needed (for pain).      . valACYclovir (VALTREX) 500 MG tablet Take 500 mg by mouth daily.      . VOLTAREN 1 % GEL APPLY 2 G TOPICALLY 3 (THREE) TIMES DAILY AS NEEDED (FOR PAIN IN LEFT ARM AND SHOULDER).  100 g  1   No current facility-administered medications for this visit.    Allergies:   No Known Allergies  Social History:  The patient  reports that he has never smoked. He has never used smokeless tobacco. He reports that he does not drink alcohol or use illicit drugs.   Family History:  The patient's family history includes  Aneurysm in his mother; Cancer in his father.   ROS:  Please see the history of present illness.  No nausea, vomiting.  No fevers, chills.  No focal weakness.  No dysuria.    All other systems reviewed and negative.   PHYSICAL EXAM: VS:  BP 130/60  Pulse 66  Ht 5\' 5"  (1.651 m)  Wt 164 lb (74.39 kg)  BMI 27.29 kg/m2 Well nourished, well developed, in no acute distress HEENT: normal Neck: no JVD, no carotid bruits Cardiac:  normal S1, S2; RRR; Lungs:  clear to auscultation bilaterally, no wheezing, rhonchi or rales Abd: soft, nontender, no hepatomegaly Ext: no edema; 2+ radial; 2+ right femoral pulse; 2+ right PT pulse Skin: warm and dry Neuro:   no focal abnormalities noted      ASSESSMENT AND PLAN:  1. CAD:   LAD DE stent in 2012.  He did have anterior T wave inversions with large bilateral PE. No problems with walking. Off antiplatelet therapy due to Xarelto.  2. Essential hypertension, benign  Continue Ramipril Capsule, 2.5 MG, TAKE 1 CAPSULE EVERY DAY Notes: Controlled. Check at the pharmacy. If readings continue to be low, may need to stop ramipril.  3. Pulmonary embolism  Continue Xarelto 20 mg tablet, ., 1 tablet, orally, once a day Notes: Large bilateral PE in 7/14. He will be seeing a hematologist. they'll need to determine length of treatment with Xarelto.  If Xarelto is stopped, then would restart  clopidogrel 75 mg daily. 4. Hyperlipidemia: LDL 70, HDL > 50 in 4/15.  continue Crestor.   Signed  Mina Marble, MD, [facility] 04/24/2013 3:05 PM

## 2013-04-24 NOTE — Patient Instructions (Signed)
Your physician recommends that you continue on your current medications as directed. Please refer to the Current Medication list given to you today.  Your physician wants you to follow-up in: 1 year with [name]. You will receive a reminder letter in the mail two months in advance. If you don't receive a letter, please call our office to schedule the follow-up appointment.   Follow up with Dr. Beryle Beams at [facility]. His nurse Tamela Oddi should give you a call with an appointment. If she doesn't call you within the next week, call her at [phone] to schedule an appointment.

## 2013-04-29 DIAGNOSIS — M5030 Other cervical disc degeneration, unspecified cervical region: Secondary | ICD-10-CM | POA: Diagnosis not present

## 2013-04-29 DIAGNOSIS — G894 Chronic pain syndrome: Secondary | ICD-10-CM | POA: Diagnosis not present

## 2013-04-29 DIAGNOSIS — Z79899 Other long term (current) drug therapy: Secondary | ICD-10-CM | POA: Diagnosis not present

## 2013-04-29 DIAGNOSIS — M5137 Other intervertebral disc degeneration, lumbosacral region: Secondary | ICD-10-CM | POA: Diagnosis not present

## 2013-05-09 DIAGNOSIS — N476 Balanoposthitis: Secondary | ICD-10-CM | POA: Diagnosis not present

## 2013-05-19 ENCOUNTER — Ambulatory Visit (INDEPENDENT_AMBULATORY_CARE_PROVIDER_SITE_OTHER): Payer: Medicare Other | Admitting: Oncology

## 2013-05-19 ENCOUNTER — Encounter: Payer: Self-pay | Admitting: Oncology

## 2013-05-19 VITALS — BP 109/64 | HR 84 | Temp 98.4°F | Ht 65.0 in | Wt 160.1 lb

## 2013-05-19 DIAGNOSIS — Z86711 Personal history of pulmonary embolism: Secondary | ICD-10-CM

## 2013-05-19 DIAGNOSIS — E119 Type 2 diabetes mellitus without complications: Secondary | ICD-10-CM | POA: Diagnosis not present

## 2013-05-19 DIAGNOSIS — R42 Dizziness and giddiness: Secondary | ICD-10-CM

## 2013-05-19 DIAGNOSIS — Z8673 Personal history of transient ischemic attack (TIA), and cerebral infarction without residual deficits: Secondary | ICD-10-CM

## 2013-05-19 DIAGNOSIS — I2699 Other pulmonary embolism without acute cor pulmonale: Secondary | ICD-10-CM | POA: Diagnosis not present

## 2013-05-19 DIAGNOSIS — E785 Hyperlipidemia, unspecified: Secondary | ICD-10-CM

## 2013-05-19 DIAGNOSIS — I2510 Atherosclerotic heart disease of native coronary artery without angina pectoris: Secondary | ICD-10-CM

## 2013-05-19 DIAGNOSIS — R2681 Unsteadiness on feet: Secondary | ICD-10-CM

## 2013-05-19 HISTORY — DX: Unsteadiness on feet: R26.81

## 2013-05-19 LAB — CBC WITH DIFFERENTIAL/PLATELET
Basophils Absolute: 0 10*3/uL (ref 0.0–0.1)
Basophils Relative: 0 % (ref 0–1)
Eosinophils Absolute: 0 10*3/uL (ref 0.0–0.7)
Eosinophils Relative: 0 % (ref 0–5)
HCT: 39.7 % (ref 39.0–52.0)
Hemoglobin: 13.6 g/dL (ref 13.0–17.0)
Lymphocytes Relative: 40 % (ref 12–46)
Lymphs Abs: 2.2 10*3/uL (ref 0.7–4.0)
MCH: 30.6 pg (ref 26.0–34.0)
MCHC: 34.3 g/dL (ref 30.0–36.0)
MCV: 89.2 fL (ref 78.0–100.0)
Monocytes Absolute: 0.4 10*3/uL (ref 0.1–1.0)
Monocytes Relative: 8 % (ref 3–12)
Neutro Abs: 2.9 10*3/uL (ref 1.7–7.7)
Neutrophils Relative %: 52 % (ref 43–77)
Platelets: 202 10*3/uL (ref 150–400)
RBC: 4.45 MIL/uL (ref 4.22–5.81)
RDW: 12.5 % (ref 11.5–15.5)
WBC: 5.5 10*3/uL (ref 4.0–10.5)

## 2013-05-19 LAB — BASIC METABOLIC PANEL WITH GFR
BUN: 17 mg/dL (ref 6–23)
CO2: 30 mEq/L (ref 19–32)
Calcium: 9 mg/dL (ref 8.4–10.5)
Chloride: 98 mEq/L (ref 96–112)
Creat: 1.1 mg/dL (ref 0.50–1.35)
GFR, Est African American: 78 mL/min
GFR, Est Non African American: 67 mL/min
Glucose, Bld: 263 mg/dL — ABNORMAL HIGH (ref 70–99)
Potassium: 4.4 mEq/L (ref 3.5–5.3)
Sodium: 137 mEq/L (ref 135–145)

## 2013-05-19 MED ORDER — ASPIRIN 81 MG PO CHEW
324.0000 mg | CHEWABLE_TABLET | Freq: Once | ORAL | Status: AC
  Administered 2013-05-19: 324 mg via ORAL

## 2013-05-19 NOTE — Patient Instructions (Signed)
Aspirin 4 x 81 mg tabs given to you in clinic today To lab today CT scan  of brain toaday at [facility] coated Aspirin 81 mg daily tomorrow Continue Xarelto current dose Call Dr Harrington Challenger to let him know about your unsteady walking and dizziness.  We will forward copies of reports to him. Return to see Dr Beryle Beams in 3 months

## 2013-05-20 ENCOUNTER — Other Ambulatory Visit: Payer: Self-pay | Admitting: Oncology

## 2013-05-20 ENCOUNTER — Ambulatory Visit (HOSPITAL_COMMUNITY)
Admission: RE | Admit: 2013-05-20 | Discharge: 2013-05-20 | Disposition: A | Discharge status: Home / Self Care | Payer: Medicare Other | Source: Ambulatory Visit | Attending: Oncology | Admitting: Oncology

## 2013-05-20 DIAGNOSIS — R5381 Other malaise: Secondary | ICD-10-CM | POA: Insufficient documentation

## 2013-05-20 DIAGNOSIS — R2681 Unsteadiness on feet: Secondary | ICD-10-CM

## 2013-05-20 DIAGNOSIS — R42 Dizziness and giddiness: Secondary | ICD-10-CM

## 2013-05-20 DIAGNOSIS — R5383 Other fatigue: Secondary | ICD-10-CM

## 2013-05-20 DIAGNOSIS — G319 Degenerative disease of nervous system, unspecified: Secondary | ICD-10-CM | POA: Diagnosis not present

## 2013-05-20 DIAGNOSIS — Z8673 Personal history of transient ischemic attack (TIA), and cerebral infarction without residual deficits: Secondary | ICD-10-CM

## 2013-05-20 DIAGNOSIS — Z86711 Personal history of pulmonary embolism: Secondary | ICD-10-CM

## 2013-05-20 DIAGNOSIS — I6350 Cerebral infarction due to unspecified occlusion or stenosis of unspecified cerebral artery: Secondary | ICD-10-CM | POA: Diagnosis not present

## 2013-05-20 NOTE — Progress Notes (Signed)
Patient ID: Steve Brown, male   DOB: 10/25/1940, 73 y.o.   MRN: [id number] Hematology and Oncology Follow Up Visit  Willim Turnage [id number] 11-10-1940 73 y.o. 05/20/2013 9:15 AM   Principle Diagnosis: Encounter Diagnoses  Name Primary?  . History of recurrent TIAs Yes  . History of pulmonary embolism: June 2014,  Takes Xarelto   . Unsteady gait   . Dizziness and giddiness      Interim History:  Annual followup visit for this 73 year old man with multiple cardiac risk factors including hypertension, hyperlipidemia, and diabetes. He is  status post LAD coronary stent placement.. He has cerebrovascular disease. He has had multiple TIAs in the past. I initially evaluated him in August of 2014 for advice on anticoagulation subsequent to unprovoked large-volume bilateral pulmonary emboli diagnosed in June 2014. I felt that his blood clot was most likely related to his known vascular disease and recommended long-term anticoagulation. He was on Xarelto at the time of his visit. I felt it was reasonable to continue that drug. He continues to followup closely with his cardiologist. He just had a brief admission in March of this year to evaluate a syncopal attack. This was felt to be related to orthostatic hypotension and not to an arrhythmia.  He now reports to me that yesterday he woke up early but felt extremely tired and went back to bed. He didn't wake up until 2:30 in the afternoon. When he got up to try to walk he was unsteady on his feet and was experiencing vertigo. No focal weakness. No slurred speech. No headache. Symptoms have improved over the last 24 hours. He did not call any physician to report these symptoms.  He is not having any chest pain, pressure, or palpitations.   Medications: reviewed  Allergies: No Known Allergies  Review of Systems: Hematology:  No bleeding or bruising ENT ROS: No sore throat Breast ROS:  Respiratory ROS: No cough or dyspnea Cardiovascular ROS:   See above Gastrointestinal ROS:  No abdominal pain or change in bowel habit  Genito-Urinary ROS: No urinary tract symptoms Musculoskeletal ROS: No muscle bone or joint pain Neurological ROS: See above Dermatological ROS: No rash Remaining ROS negative:   Physical Exam: Blood pressure 109/64, pulse 84, temperature 98.4 F (36.9 C), temperature source Oral, height 5\' 5"  (1.651 m), weight 160 lb 1.6 oz (72.621 kg), SpO2 98.00%. Wt Readings from Last 3 Encounters:  05/19/13 160 lb 1.6 oz (72.621 kg)  04/24/13 164 lb (74.39 kg)  04/03/13 166 lb (75.297 kg)     General appearance: A pleasant well-nourished African American man  HENNT: Pharynx no erythema, exudate, mass, or ulcer. No thyromegaly or thyroid nodules Lymph nodes: No cervical, supraclavicular, or axillary lymphadenopathy Breasts: Lungs: Clear to auscultation, resonant to percussion throughout Heart: Regular rhythm, no murmur, no gallop, no rub, no click, no edema Abdomen: Soft, nontender, normal bowel sounds, no mass, no organomegaly Extremities: No edema, no calf tenderness Musculoskeletal: no joint deformities GU:  Vascular: Carotid pulses 2+, no bruits, distal pulses: Dorsalis pedis 1+ symmetric Neurologic: Alert, oriented, PERRLA, optic discs sharp and vessels normal, no hemorrhage or exudate, cranial nerves grossly normal, motor strength 5 over 5, reflexes 1+ symmetric, upper body coordination normal, finger to finger, finger to nose, rapid alternating movements normal, gait normal, he is able to do tandem walking with no difficulty. Skin: No rash or ecchymosis  Lab Results: CBC W/Diff    Component Value Date/Time   WBC 5.5 05/19/2013 1108   WBC 5.4  08/14/2012 1512   RBC 4.45 05/19/2013 1108   RBC 4.23 08/14/2012 1512   HGB 13.6 05/19/2013 1108   HGB 13.1 08/14/2012 1512   HCT 39.7 05/19/2013 1108   HCT 38.8 08/14/2012 1512   PLT 202 05/19/2013 1108   PLT 204 08/14/2012 1512   MCV 89.2 05/19/2013 1108   MCV 91.7 08/14/2012 1512    MCH 30.6 05/19/2013 1108   MCH 31.0 08/14/2012 1512   MCHC 34.3 05/19/2013 1108   MCHC 33.8 08/14/2012 1512   RDW 12.5 05/19/2013 1108   RDW 13.3 08/14/2012 1512   LYMPHSABS 2.2 05/19/2013 1108   LYMPHSABS 2.1 08/14/2012 1512   MONOABS 0.4 05/19/2013 1108   MONOABS 0.4 08/14/2012 1512   EOSABS 0.0 05/19/2013 1108   EOSABS 0.0 08/14/2012 1512   BASOSABS 0.0 05/19/2013 1108   BASOSABS 0.0 08/14/2012 1512     Chemistry      Component Value Date/Time   NA 137 05/19/2013 1108   NA 140 08/14/2012 1513   K 4.4 05/19/2013 1108   K 4.3 08/14/2012 1513   CL 98 05/19/2013 1108   CO2 30 05/19/2013 1108   CO2 27 08/14/2012 1513   BUN 17 05/19/2013 1108   BUN 19.0 08/14/2012 1513   CREATININE 1.10 05/19/2013 1108   CREATININE 1.1 04/01/2013 1631   CREATININE 1.3 08/14/2012 1513      Component Value Date/Time   CALCIUM 9.0 05/19/2013 1108   CALCIUM 9.3 08/14/2012 1513   ALKPHOS 70 03/20/2013 1300   ALKPHOS 92 08/14/2012 1513   AST 19 03/20/2013 1300   AST 15 08/14/2012 1513   ALT 19 03/20/2013 1300   ALT 14 08/14/2012 1513   BILITOT 0.4 03/20/2013 1300   BILITOT 0.39 08/14/2012 1513       Impression:  #1. History of unprovoked large volume pulmonary emboli Continue long-term anticoagulation with Xarelto.  #2. Known cerebrovascular disease. No 24-hour history of vertigo and unsteady gait. No focal deficits on current neurologic exam. He is supposed to be taking low dose aspirin but has not been taking it. I gave him 481 mg aspirin tablets in the clinic today. I am scheduling him for a contrast CT scan of the brain to look for any new pathology. He is advised to go back on aspirin 81 mg daily in addition to the Xarelto. Of note, at least one large clinical trial was stopped early went to antiplatelet agents were added to a Xa inhibitor (apixiban) secondary to unacceptable bleeding complications. Therefore, I would not add Plavix back in addition to the aspirin.  #3. Known coronary artery disease status post LAD stent  #4.  Insulin-dependent diabetes  #5. Hyperlipidemia    CC: Patient Care Team: Gus Height, MD as PCP - General (Obstetrics and Gynecology)   Annia Belt, MD 5/12/20159:15 AM

## 2013-05-22 ENCOUNTER — Telehealth: Payer: Self-pay | Admitting: *Deleted

## 2013-05-22 NOTE — Telephone Encounter (Signed)
Called pt - informed pt CT of brain showed new,small mini stroke which probably happened a few weeks ago and to stay on ASA and Xarelto per Dr Beryle Beams. Also remined pt to call Dr Harrington Challenger' office as instructed per Dr Beryle Beams - stated he would.

## 2013-05-22 NOTE — Telephone Encounter (Signed)
Message copied by Ebbie Latus on Thu May 22, 2013 12:02 PM ------      Message from: Annia Belt      Created: Wed May 21, 2013  9:07 AM       Call pt: CT brain shows  new, small ministroke which probably happened a few weeks ago.  Stay on aspirin and Xarelto. ------

## 2013-05-26 DIAGNOSIS — I739 Peripheral vascular disease, unspecified: Secondary | ICD-10-CM | POA: Diagnosis not present

## 2013-05-26 DIAGNOSIS — E1059 Type 1 diabetes mellitus with other circulatory complications: Secondary | ICD-10-CM | POA: Diagnosis not present

## 2013-05-26 DIAGNOSIS — L608 Other nail disorders: Secondary | ICD-10-CM | POA: Diagnosis not present

## 2013-05-30 DIAGNOSIS — R5381 Other malaise: Secondary | ICD-10-CM | POA: Diagnosis not present

## 2013-05-30 DIAGNOSIS — R930 Abnormal findings on diagnostic imaging of skull and head, not elsewhere classified: Secondary | ICD-10-CM | POA: Diagnosis not present

## 2013-05-30 DIAGNOSIS — J329 Chronic sinusitis, unspecified: Secondary | ICD-10-CM | POA: Diagnosis not present

## 2013-05-30 DIAGNOSIS — R937 Abnormal findings on diagnostic imaging of other parts of musculoskeletal system: Secondary | ICD-10-CM | POA: Insufficient documentation

## 2013-05-30 DIAGNOSIS — R059 Cough, unspecified: Secondary | ICD-10-CM | POA: Diagnosis not present

## 2013-05-30 DIAGNOSIS — R05 Cough: Secondary | ICD-10-CM | POA: Diagnosis not present

## 2013-05-30 DIAGNOSIS — R5383 Other fatigue: Secondary | ICD-10-CM | POA: Diagnosis not present

## 2013-06-10 DIAGNOSIS — M5137 Other intervertebral disc degeneration, lumbosacral region: Secondary | ICD-10-CM | POA: Diagnosis not present

## 2013-06-10 DIAGNOSIS — M47817 Spondylosis without myelopathy or radiculopathy, lumbosacral region: Secondary | ICD-10-CM | POA: Diagnosis not present

## 2013-06-10 DIAGNOSIS — M5030 Other cervical disc degeneration, unspecified cervical region: Secondary | ICD-10-CM | POA: Diagnosis not present

## 2013-06-10 DIAGNOSIS — M5412 Radiculopathy, cervical region: Secondary | ICD-10-CM | POA: Diagnosis not present

## 2013-06-12 ENCOUNTER — Encounter: Payer: Self-pay | Admitting: *Deleted

## 2013-06-18 ENCOUNTER — Encounter: Payer: Self-pay | Admitting: Neurology

## 2013-06-18 ENCOUNTER — Ambulatory Visit (INDEPENDENT_AMBULATORY_CARE_PROVIDER_SITE_OTHER): Payer: Medicare Other | Admitting: Neurology

## 2013-06-18 ENCOUNTER — Ambulatory Visit: Payer: Medicare Other | Attending: Neurology | Admitting: Physical Therapy

## 2013-06-18 ENCOUNTER — Encounter (INDEPENDENT_AMBULATORY_CARE_PROVIDER_SITE_OTHER): Payer: Self-pay

## 2013-06-18 VITALS — BP 121/74 | HR 65 | Ht 65.25 in | Wt 163.0 lb

## 2013-06-18 DIAGNOSIS — IMO0001 Reserved for inherently not codable concepts without codable children: Secondary | ICD-10-CM | POA: Insufficient documentation

## 2013-06-18 DIAGNOSIS — I69998 Other sequelae following unspecified cerebrovascular disease: Secondary | ICD-10-CM | POA: Diagnosis not present

## 2013-06-18 DIAGNOSIS — R0609 Other forms of dyspnea: Secondary | ICD-10-CM | POA: Diagnosis not present

## 2013-06-18 DIAGNOSIS — R0989 Other specified symptoms and signs involving the circulatory and respiratory systems: Secondary | ICD-10-CM

## 2013-06-18 DIAGNOSIS — I2510 Atherosclerotic heart disease of native coronary artery without angina pectoris: Secondary | ICD-10-CM | POA: Diagnosis not present

## 2013-06-18 DIAGNOSIS — D689 Coagulation defect, unspecified: Secondary | ICD-10-CM

## 2013-06-18 DIAGNOSIS — I639 Cerebral infarction, unspecified: Secondary | ICD-10-CM

## 2013-06-18 DIAGNOSIS — M79605 Pain in left leg: Secondary | ICD-10-CM

## 2013-06-18 DIAGNOSIS — I69959 Hemiplegia and hemiparesis following unspecified cerebrovascular disease affecting unspecified side: Secondary | ICD-10-CM | POA: Diagnosis not present

## 2013-06-18 DIAGNOSIS — I6350 Cerebral infarction due to unspecified occlusion or stenosis of unspecified cerebral artery: Secondary | ICD-10-CM

## 2013-06-18 DIAGNOSIS — T50905A Adverse effect of unspecified drugs, medicaments and biological substances, initial encounter: Secondary | ICD-10-CM

## 2013-06-18 DIAGNOSIS — M79609 Pain in unspecified limb: Secondary | ICD-10-CM | POA: Diagnosis not present

## 2013-06-18 DIAGNOSIS — R638 Other symptoms and signs concerning food and fluid intake: Secondary | ICD-10-CM | POA: Diagnosis not present

## 2013-06-18 DIAGNOSIS — R0683 Snoring: Secondary | ICD-10-CM

## 2013-06-18 DIAGNOSIS — Z8679 Personal history of other diseases of the circulatory system: Secondary | ICD-10-CM | POA: Diagnosis not present

## 2013-06-18 DIAGNOSIS — R635 Abnormal weight gain: Secondary | ICD-10-CM

## 2013-06-18 DIAGNOSIS — R269 Unspecified abnormalities of gait and mobility: Secondary | ICD-10-CM | POA: Insufficient documentation

## 2013-06-18 DIAGNOSIS — G8190 Hemiplegia, unspecified affecting unspecified side: Secondary | ICD-10-CM | POA: Diagnosis not present

## 2013-06-18 DIAGNOSIS — R5381 Other malaise: Secondary | ICD-10-CM | POA: Insufficient documentation

## 2013-06-18 DIAGNOSIS — G4710 Hypersomnia, unspecified: Secondary | ICD-10-CM

## 2013-06-18 DIAGNOSIS — G4730 Sleep apnea, unspecified: Secondary | ICD-10-CM

## 2013-06-18 NOTE — Progress Notes (Signed)
[facility]  Provider:  Dr Brett Fairy Referring Provider: Gus Height, MD Primary Care Physician:  Gus Height, MD  Chief Complaint  Patient presents with  . New Evaluation    Room 10  . Neurologic Problem    The exam is today referred in new for a neurologic problem. The patient has followed Dr. Phillip Heal for tremor his oncologist hematologist and saw him last in March or April of this year in May he underwent a CT of the head, documenting chronic sphemoid sinusitis which has been progressive from last year. His primary care physician, Dr. Melinda Crutch requested a reevaluation.   In addition the patient has been concerned that he may have had a stroke. He had an episode of confusion mild exercising at the [facility]. He also noted that his falling asleep when not stimulated or physically active and naps quite often in his recliner.  He had an upper respiratory infection which Dr. Harrington Challenger had treated.  During that time, he noticed his left arm to be weaker.  In early May he also became extremely fatigued and he stated he could not get out of bed for about 2 weeks,  he lost the ability to recognize what day of the week it was. His left eye became bloodshot- soon after his left leg was weaker than before. During exercises he lifts a 3 pound  weight with the right arm exercises and only a 2 pound weight for the left-  he has noted pain shooting down the left arm into the hand during weight lifting.  I also reviewed his long medication list : patient has been on Xeralto for chronic anticoagulation.  Please note that the patient has moderate depression in the past as documented on the geriatric depression score today again, but he endorsed at 9 points out of 15.     Last visit notes:  HPI:  Steve Brown is a 73 y.o. male here as a referral from Dr. Harrington Challenger for cerebrovascular disease. [name] is a  73 year old right-handed African American gentleman, seen here today for a routine revisit.  He had 2 strokes  , one in each hemisphere , embolic strokes.  He is followed by a Dr.Spivey  since Summer 2011 for  pain management , especially of back pain. But he has experienced other sources of discomfort besides his established back pain diagnosis.  He had to reported that coughing or sneezing  not affect his back pain and he denies any numbness or change in bowel or bladder continence. One of his main complaints was a sharp pain arising from the middle to lower back worse on the right side that radiates into the upper right buttock and sometimes down the anterior leg.  He has been seeing Dr. Geoffery Lyons . [name] had ruled out cardiology conditions.He has continued to use anticoagulation and is now on a Coumadin replacement Xeralto.   He patient was originally referred in 2011 for sleep apnea the patient has reported having a tendency to have nightmares but every night but frequently visit dreams and felt that once he used CPAP also become more frequent. They may offer all reflexes ability to enter REM sleep stages via apnea is treated I have and 98-1/2-year-old daughter download from the CPAP machine to review here from generally to thousand 11 at that time the patient was on 11 cm water and had a residual AHI of 1.1 but his compliance was spotty. Today's Epworth sleepiness score is 9 points the fatigue severity score  is 40 pints the geriatric depression score is 5 points and his fall risk assessment is 9 points.  He did discontinue the CPAP use- i will not longer follow him  at the  sleep clinic. His pain management needs to be centered with pain clinic.  He continued to go to the [facility] 4 days per week.  He is active and his weight is stable.   The patient requests a refill of a topical anesthetic in topical form as well as Lyrica  refills.    Review of Systems: Out of a complete 14 system review, the patient complains of only the following symptoms, and all other reviewed systems are  negative.  High fatigue, chronic pain. DEPRESSION, left sided symptoms, such as weaker grip, weaker "leg" and blood shot eye.   History   Social History  . Marital Status: Divorced    Spouse Name: N/A    Number of Children: 68  . Years of Education: 12   Occupational History  .      retired   Social History Main Topics  . Smoking status: Never Smoker   . Smokeless tobacco: Never Used  . Alcohol Use: No  . Drug Use: No  . Sexual Activity: Not on file   Other Topics Concern  . Not on file   Social History Narrative   Patient lives at home alone and he is single.  Patient is retired.    Caffeine - one cups daily.   Right handed.   Patient has a high school education.   Patient has four adult children.    Family History  Problem Relation Age of Onset  . Aneurysm Mother   . Cancer Father     Past Medical History  Diagnosis Date  . Diabetes mellitus   . Coronary artery disease   . Hypertension   . Stroke 2002, 2003  . History of TIAs   . Lumbago   . Benign localized hyperplasia of prostate without urinary obstruction and other lower urinary tract symptoms (LUTS)   . Other and unspecified hyperlipidemia   . Unspecified essential hypertension   . Type II or unspecified type diabetes mellitus without mention of complication, not stated as uncontrolled   . Personal history of unspecified circulatory disease   . Unspecified fall   . Hypersomnia with sleep apnea, unspecified   . Pain in joint, multiple sites   . Degeneration of cervical intervertebral disc   . Unspecified cardiovascular disease   . Unsteady gait 05/19/2013    X 24 hours - improving. Hx prior CVA/TIAs; multiple risk factors; on Xarelto    Past Surgical History  Procedure Laterality Date  . Hernia repair    . Carpal tunnel release      bilateral  . Elbow surgery      right elbow - nerve release  . Cervical disc surgery    . Lungs      "fluid pumped off lungs"  . Coronary stent placement  Feb.  2010  . Cardiac catheterization    . Coronary angioplasty      Current Outpatient Prescriptions  Medication Sig Dispense Refill  . Ascorbic Acid (VITAMIN C PO) Take 1 tablet by mouth daily.      . BD PEN NEEDLE NANO U/F 32G X 4 MM MISC       . cholecalciferol (VITAMIN D) 1000 UNITS tablet Take 1,000 Units by mouth daily.      . DULoxetine (CYMBALTA) 60 MG capsule Take 60 mg  by mouth daily.      Marland Kitchen dutasteride (AVODART) 0.5 MG capsule Take 0.5 mg by mouth daily.      . ferrous sulfate 325 (65 FE) MG tablet Take 325 mg by mouth daily with breakfast.      . fluticasone (FLONASE) 50 MCG/ACT nasal spray       . furosemide (LASIX) 20 MG tablet Take 20 mg by mouth daily as needed (swelling).       . insulin detemir (LEVEMIR) 100 UNIT/ML injection Inject 18-25 Units into the skin daily. If blood sugar is <150 give 18 units.  If blood sugar is 151-200 give 20 units.  If blood sugar is >200 give 25 units.      . metFORMIN (GLUCOPHAGE) 500 MG tablet Take 2 tablets (1,000 mg total) by mouth 2 (two) times daily with a meal.      . Multiple Vitamin (MULTIVITAMIN WITH MINERALS) TABS Take 1 tablet by mouth every evening.       . nitroGLYCERIN (NITROSTAT) 0.4 MG SL tablet Place 1 tablet (0.4 mg total) under the tongue every 5 (five) minutes as needed for chest pain.  25 tablet  12  . nystatin ointment (MYCOSTATIN)       . pantoprazole (PROTONIX) 40 MG tablet Take 40 mg by mouth daily.      . pregabalin (LYRICA) 300 MG capsule Take 1 capsule (300 mg total) by mouth 2 (two) times daily.  180 capsule  3  . ramipril (ALTACE) 2.5 MG capsule Take 2.5 mg by mouth every evening.       . repaglinide (PRANDIN) 2 MG tablet Take 2 mg by mouth 3 (three) times daily before meals.      . Rivaroxaban (XARELTO) 20 MG TABS tablet Take 1 tablet (20 mg total) by mouth daily with supper.  30 tablet    . rosuvastatin (CRESTOR) 10 MG tablet Take 10 mg by mouth every evening.       . tapentadol (NUCYNTA) 50 MG TABS Take 50 mg by  mouth 3 (three) times daily as needed (for pain).      . valACYclovir (VALTREX) 500 MG tablet Take 500 mg by mouth daily.      . VOLTAREN 1 % GEL APPLY 2 G TOPICALLY 3 (THREE) TIMES DAILY AS NEEDED (FOR PAIN IN LEFT ARM AND SHOULDER).  100 g  1   No current facility-administered medications for this visit.    Allergies as of 06/18/2013 - Review Complete 06/18/2013  Allergen Reaction Noted  . Phenergan  [promethazine]  06/18/2013    Vitals: BP 121/74  Pulse 65  Ht 5' 5.25" (1.657 m)  Wt 163 lb (73.936 kg)  BMI 26.93 kg/m2 Last Weight:  Wt Readings from Last 1 Encounters:  06/18/13 163 lb (73.936 kg)   Last Height:   Ht Readings from Last 1 Encounters:  06/18/13 5' 5.25" (1.657 m)     Physical exam:  General: The patient is awake, alert and appears not in acute distress. The patient is well groomed. Head: Normocephalic, atraumatic. Neck is supple. Mallampati3 neck circumference: 14.5 , retrognathia. Nasal septum is midline, nasal airflow unhindered.  Cardiovascular:  regular rate - rare palpiations, without  murmurs or carotid bruit, and without distended neck veins. Respiratory: Lungs are clear to auscultation. Skin:  Without evidence of edema, or rash Trunk: BMI is elevated - normal posture.  Neurologic exam : The patient is awake and alert, oriented to place and time.  Memory subjective  described as intact.  There is a normal attention span & concentration ability.  Speech is fluent without dysarthria, dysphonia or aphasia. Mood and affect : concerned, worried.  Cranial nerves: Pupils are equal and briskly reactive to light. Funduscopic exam without evidence of pallor or edema.  Extraocular movements  in vertical and horizontal planes intact and without nystagmus. Visual fields by finger perimetry are intact. Hearing to finger rub intact.  Facial sensation intact to fine touch. Facial motor strength is symmetric and tongue and uvula move midline.  Motor exam:  The  patient reports pain radiating down the left upper extremity and his left hand is less sensitive to pinprick and fine touch . This started after the stroke on the right MCA.  He also has reported a patch over the left shoulder that is numb. He is able to perform rapid movements with both upper extremities and hands finger-to-nose .  His gait is changed, -  He walks extremely careful, and tried to lean on the computer cart with his left arm- which almost let to a fall. He could only with difficulties ascend to the exam table, .  His DT reflexes were symmetric. He has not reported any recent falls.  His left Babinski response is up-going, but he a has strong plantar and dorsiflexion at the ankle. No foot drop.    This 2014  fall risk assessment was at 9 points, 5 from medications. 2  Points from age alone in his last visit, IT is now at 12 points.  .  Sensory:  Fine touch, pinprick and vibration were tested in all extremities. Proprioception normal. He describes shock sensations down the left leg, preserved brisk reflexes, his left arm feels heavy, he has trouble to place the left leg fisrt on a stair, when walking he drifts to the left. Stumbling on occasion.    Coordination: Rapid alternating movements in the fingers/hands is tested and normal. Finger-to-nose maneuver tested and normal without evidence of ataxia, dysmetria or tremor.   Deep tendon reflexes: in the  upper and lower extremities are symmetric and intact. Babinski maneuver response is left upgoing .  Assessment:  After physical and neurologic examination, review of laboratory studies, imaging, neurophysiology testing and pre-existing records, assessment ; The patient may indeed have suffered another stroke or has cervical spine changes that could account for the heaviness on the left side upper and lower extremity.  He reports weight gain form steroid sue and has stopped injections for now.   His Uvula was not longer visible on exam,  and he is likely to snore now.  There could be apnea now.      Plan:  Treatment plan and additional workup will be reviewed under Problem List. Refills.  SPLIT STUDY with CO2 ordered,  MRI brain and cervical spine ordered.  PT to be addressing gait stability.  Continued stroke prevention on XERALTO ,  continued neurologic pain treatment  on Lyrica and topical Voltaren Dr .Vira Blanco . Patient remains with orthopedist and pain management .

## 2013-06-18 NOTE — Patient Instructions (Signed)

## 2013-06-20 DIAGNOSIS — R5381 Other malaise: Secondary | ICD-10-CM | POA: Diagnosis not present

## 2013-06-20 DIAGNOSIS — E782 Mixed hyperlipidemia: Secondary | ICD-10-CM | POA: Diagnosis not present

## 2013-06-20 DIAGNOSIS — E559 Vitamin D deficiency, unspecified: Secondary | ICD-10-CM | POA: Diagnosis not present

## 2013-06-20 DIAGNOSIS — I679 Cerebrovascular disease, unspecified: Secondary | ICD-10-CM | POA: Diagnosis not present

## 2013-06-20 DIAGNOSIS — I10 Essential (primary) hypertension: Secondary | ICD-10-CM | POA: Diagnosis not present

## 2013-06-20 DIAGNOSIS — I2510 Atherosclerotic heart disease of native coronary artery without angina pectoris: Secondary | ICD-10-CM | POA: Diagnosis not present

## 2013-06-20 DIAGNOSIS — R5383 Other fatigue: Secondary | ICD-10-CM | POA: Diagnosis not present

## 2013-06-20 DIAGNOSIS — IMO0001 Reserved for inherently not codable concepts without codable children: Secondary | ICD-10-CM | POA: Diagnosis not present

## 2013-06-20 DIAGNOSIS — G609 Hereditary and idiopathic neuropathy, unspecified: Secondary | ICD-10-CM | POA: Diagnosis not present

## 2013-06-24 ENCOUNTER — Ambulatory Visit: Payer: Medicare Other | Admitting: Physical Therapy

## 2013-06-24 DIAGNOSIS — IMO0001 Reserved for inherently not codable concepts without codable children: Secondary | ICD-10-CM | POA: Diagnosis not present

## 2013-06-24 DIAGNOSIS — I69998 Other sequelae following unspecified cerebrovascular disease: Secondary | ICD-10-CM | POA: Diagnosis not present

## 2013-06-24 DIAGNOSIS — R269 Unspecified abnormalities of gait and mobility: Secondary | ICD-10-CM | POA: Diagnosis not present

## 2013-06-24 DIAGNOSIS — R5381 Other malaise: Secondary | ICD-10-CM | POA: Diagnosis not present

## 2013-06-25 ENCOUNTER — Ambulatory Visit: Payer: Medicare Other | Admitting: Physical Therapy

## 2013-06-25 DIAGNOSIS — IMO0001 Reserved for inherently not codable concepts without codable children: Secondary | ICD-10-CM | POA: Diagnosis not present

## 2013-06-25 DIAGNOSIS — R5381 Other malaise: Secondary | ICD-10-CM | POA: Diagnosis not present

## 2013-06-25 DIAGNOSIS — R269 Unspecified abnormalities of gait and mobility: Secondary | ICD-10-CM | POA: Diagnosis not present

## 2013-06-25 DIAGNOSIS — I69998 Other sequelae following unspecified cerebrovascular disease: Secondary | ICD-10-CM | POA: Diagnosis not present

## 2013-06-27 DIAGNOSIS — R972 Elevated prostate specific antigen [PSA]: Secondary | ICD-10-CM | POA: Diagnosis not present

## 2013-07-04 ENCOUNTER — Ambulatory Visit
Admission: RE | Admit: 2013-07-04 | Discharge: 2013-07-04 | Disposition: A | Discharge status: Home / Self Care | Payer: Medicare Other | Source: Ambulatory Visit | Attending: Neurology | Admitting: Neurology

## 2013-07-04 DIAGNOSIS — I6350 Cerebral infarction due to unspecified occlusion or stenosis of unspecified cerebral artery: Secondary | ICD-10-CM | POA: Diagnosis not present

## 2013-07-04 DIAGNOSIS — T50905A Adverse effect of unspecified drugs, medicaments and biological substances, initial encounter: Secondary | ICD-10-CM

## 2013-07-04 DIAGNOSIS — D689 Coagulation defect, unspecified: Secondary | ICD-10-CM

## 2013-07-04 DIAGNOSIS — G4730 Sleep apnea, unspecified: Secondary | ICD-10-CM

## 2013-07-04 DIAGNOSIS — I639 Cerebral infarction, unspecified: Secondary | ICD-10-CM

## 2013-07-04 DIAGNOSIS — G4710 Hypersomnia, unspecified: Secondary | ICD-10-CM

## 2013-07-04 DIAGNOSIS — R0683 Snoring: Secondary | ICD-10-CM

## 2013-07-04 DIAGNOSIS — R635 Abnormal weight gain: Secondary | ICD-10-CM

## 2013-07-07 DIAGNOSIS — N401 Enlarged prostate with lower urinary tract symptoms: Secondary | ICD-10-CM | POA: Diagnosis not present

## 2013-07-07 DIAGNOSIS — R972 Elevated prostate specific antigen [PSA]: Secondary | ICD-10-CM | POA: Diagnosis not present

## 2013-07-07 DIAGNOSIS — R351 Nocturia: Secondary | ICD-10-CM | POA: Diagnosis not present

## 2013-07-08 ENCOUNTER — Ambulatory Visit: Payer: Medicare Other | Admitting: Physical Therapy

## 2013-07-08 DIAGNOSIS — R5381 Other malaise: Secondary | ICD-10-CM | POA: Diagnosis not present

## 2013-07-08 DIAGNOSIS — I69998 Other sequelae following unspecified cerebrovascular disease: Secondary | ICD-10-CM | POA: Diagnosis not present

## 2013-07-08 DIAGNOSIS — R269 Unspecified abnormalities of gait and mobility: Secondary | ICD-10-CM | POA: Diagnosis not present

## 2013-07-08 DIAGNOSIS — IMO0001 Reserved for inherently not codable concepts without codable children: Secondary | ICD-10-CM | POA: Diagnosis not present

## 2013-07-09 ENCOUNTER — Encounter (INDEPENDENT_AMBULATORY_CARE_PROVIDER_SITE_OTHER): Payer: Self-pay

## 2013-07-09 ENCOUNTER — Ambulatory Visit (INDEPENDENT_AMBULATORY_CARE_PROVIDER_SITE_OTHER): Payer: Medicare Other | Admitting: Diagnostic Neuroimaging

## 2013-07-09 DIAGNOSIS — M79605 Pain in left leg: Secondary | ICD-10-CM

## 2013-07-09 DIAGNOSIS — M792 Neuralgia and neuritis, unspecified: Secondary | ICD-10-CM

## 2013-07-09 DIAGNOSIS — M79609 Pain in unspecified limb: Secondary | ICD-10-CM | POA: Diagnosis not present

## 2013-07-09 DIAGNOSIS — Z0289 Encounter for other administrative examinations: Secondary | ICD-10-CM

## 2013-07-09 DIAGNOSIS — I639 Cerebral infarction, unspecified: Secondary | ICD-10-CM

## 2013-07-09 DIAGNOSIS — R635 Abnormal weight gain: Secondary | ICD-10-CM

## 2013-07-09 DIAGNOSIS — R209 Unspecified disturbances of skin sensation: Secondary | ICD-10-CM | POA: Diagnosis not present

## 2013-07-09 DIAGNOSIS — T50905A Adverse effect of unspecified drugs, medicaments and biological substances, initial encounter: Secondary | ICD-10-CM

## 2013-07-09 NOTE — Procedures (Signed)
   [facility]  NCS (NERVE CONDUCTION STUDY) WITH EMG (ELECTROMYOGRAPHY) REPORT   STUDY DATE: 07/09/13 PATIENT NAME: Steve Brown DOB: [birth year] MRN: [id number]  ORDERING CLINICIAN: Larey Seat, MD   TECHNOLOGIST: Laretta Alstrom  ELECTROMYOGRAPHER: Earlean Polka. Everhett Bozard, MD  CLINICAL INFORMATION: 73 year old male with left arm and left leg numbness and burning.  FINDINGS: NERVE CONDUCTION STUDY: Left median, left ulnar, bilateral peroneal and bilateral tibial motor responses and F-wave latencies are normal. Bilateral H reflex responses are normal. Left median, left ulnar, bilateral peroneal sensory responses are normal.  NEEDLE ELECTROMYOGRAPHY: Left upper extremity deltoid, biceps, triceps, flexor carpi radialis, first dorsal interosseous muscles show no abnormal spontaneous activity at rest and normal motor unit recruitment on exertion. Paraspinal muscles deferred as patient is on anticoagulation.   IMPRESSION:  Normal study. No evidence of large fiber neuropathy.    INTERPRETING PHYSICIAN:  Penni Bombard, MD Certified in Neurology, Neurophysiology and Neuroimaging  [facility] [address], [place], [place] [address] [phone]

## 2013-07-10 ENCOUNTER — Ambulatory Visit: Payer: Medicare Other | Admitting: Physical Therapy

## 2013-07-15 ENCOUNTER — Ambulatory Visit: Payer: Medicare Other | Admitting: Physical Therapy

## 2013-07-17 ENCOUNTER — Ambulatory Visit: Payer: Medicare Other | Attending: Neurology | Admitting: Physical Therapy

## 2013-07-17 DIAGNOSIS — IMO0001 Reserved for inherently not codable concepts without codable children: Secondary | ICD-10-CM | POA: Insufficient documentation

## 2013-07-17 DIAGNOSIS — R5381 Other malaise: Secondary | ICD-10-CM | POA: Diagnosis not present

## 2013-07-17 DIAGNOSIS — R269 Unspecified abnormalities of gait and mobility: Secondary | ICD-10-CM | POA: Diagnosis not present

## 2013-07-17 DIAGNOSIS — I69998 Other sequelae following unspecified cerebrovascular disease: Secondary | ICD-10-CM | POA: Diagnosis not present

## 2013-07-17 DIAGNOSIS — I10 Essential (primary) hypertension: Secondary | ICD-10-CM | POA: Diagnosis not present

## 2013-07-21 ENCOUNTER — Ambulatory Visit: Payer: Medicare Other | Admitting: Physical Therapy

## 2013-07-21 DIAGNOSIS — I10 Essential (primary) hypertension: Secondary | ICD-10-CM | POA: Diagnosis not present

## 2013-07-21 DIAGNOSIS — M542 Cervicalgia: Secondary | ICD-10-CM | POA: Diagnosis not present

## 2013-07-21 DIAGNOSIS — IMO0001 Reserved for inherently not codable concepts without codable children: Secondary | ICD-10-CM | POA: Diagnosis not present

## 2013-07-21 NOTE — Progress Notes (Signed)
Quick Note:  Left message with EMG/NCV normal, no large fiber neuropathy found, per Dr. Brett Fairy. Relayed that further detail can be discussed in follow up appointment this week. ______

## 2013-07-23 ENCOUNTER — Ambulatory Visit: Payer: Medicare Other | Admitting: Physical Therapy

## 2013-07-23 ENCOUNTER — Observation Stay (HOSPITAL_COMMUNITY)
Admission: EM | Admit: 2013-07-23 | Discharge: 2013-07-24 | Disposition: A | Discharge status: Home / Self Care | Payer: Medicare Other | Attending: Family Medicine | Admitting: Family Medicine

## 2013-07-23 ENCOUNTER — Encounter (HOSPITAL_COMMUNITY): Payer: Self-pay | Admitting: Emergency Medicine

## 2013-07-23 DIAGNOSIS — I2510 Atherosclerotic heart disease of native coronary artery without angina pectoris: Secondary | ICD-10-CM | POA: Diagnosis not present

## 2013-07-23 DIAGNOSIS — M5030 Other cervical disc degeneration, unspecified cervical region: Secondary | ICD-10-CM | POA: Insufficient documentation

## 2013-07-23 DIAGNOSIS — R61 Generalized hyperhidrosis: Secondary | ICD-10-CM | POA: Diagnosis not present

## 2013-07-23 DIAGNOSIS — Z8673 Personal history of transient ischemic attack (TIA), and cerebral infarction without residual deficits: Secondary | ICD-10-CM | POA: Diagnosis not present

## 2013-07-23 DIAGNOSIS — E118 Type 2 diabetes mellitus with unspecified complications: Secondary | ICD-10-CM | POA: Diagnosis present

## 2013-07-23 DIAGNOSIS — R55 Syncope and collapse: Secondary | ICD-10-CM | POA: Diagnosis not present

## 2013-07-23 DIAGNOSIS — E785 Hyperlipidemia, unspecified: Secondary | ICD-10-CM | POA: Diagnosis present

## 2013-07-23 DIAGNOSIS — R269 Unspecified abnormalities of gait and mobility: Secondary | ICD-10-CM | POA: Diagnosis not present

## 2013-07-23 DIAGNOSIS — G8190 Hemiplegia, unspecified affecting unspecified side: Secondary | ICD-10-CM

## 2013-07-23 DIAGNOSIS — N400 Enlarged prostate without lower urinary tract symptoms: Secondary | ICD-10-CM | POA: Diagnosis not present

## 2013-07-23 DIAGNOSIS — G4710 Hypersomnia, unspecified: Secondary | ICD-10-CM | POA: Insufficient documentation

## 2013-07-23 DIAGNOSIS — E782 Mixed hyperlipidemia: Secondary | ICD-10-CM

## 2013-07-23 DIAGNOSIS — E119 Type 2 diabetes mellitus without complications: Secondary | ICD-10-CM | POA: Insufficient documentation

## 2013-07-23 DIAGNOSIS — G4730 Sleep apnea, unspecified: Secondary | ICD-10-CM

## 2013-07-23 DIAGNOSIS — M545 Low back pain, unspecified: Secondary | ICD-10-CM | POA: Diagnosis present

## 2013-07-23 DIAGNOSIS — I639 Cerebral infarction, unspecified: Secondary | ICD-10-CM

## 2013-07-23 DIAGNOSIS — I10 Essential (primary) hypertension: Secondary | ICD-10-CM | POA: Diagnosis not present

## 2013-07-23 DIAGNOSIS — M2550 Pain in unspecified joint: Secondary | ICD-10-CM

## 2013-07-23 DIAGNOSIS — R42 Dizziness and giddiness: Secondary | ICD-10-CM | POA: Diagnosis not present

## 2013-07-23 DIAGNOSIS — IMO0002 Reserved for concepts with insufficient information to code with codable children: Secondary | ICD-10-CM | POA: Diagnosis not present

## 2013-07-23 DIAGNOSIS — Z9861 Coronary angioplasty status: Secondary | ICD-10-CM | POA: Insufficient documentation

## 2013-07-23 DIAGNOSIS — Z794 Long term (current) use of insulin: Secondary | ICD-10-CM | POA: Insufficient documentation

## 2013-07-23 DIAGNOSIS — Z79899 Other long term (current) drug therapy: Secondary | ICD-10-CM | POA: Diagnosis not present

## 2013-07-23 DIAGNOSIS — Z8679 Personal history of other diseases of the circulatory system: Secondary | ICD-10-CM

## 2013-07-23 DIAGNOSIS — R2681 Unsteadiness on feet: Secondary | ICD-10-CM

## 2013-07-23 DIAGNOSIS — R112 Nausea with vomiting, unspecified: Secondary | ICD-10-CM | POA: Diagnosis not present

## 2013-07-23 DIAGNOSIS — Z9889 Other specified postprocedural states: Secondary | ICD-10-CM | POA: Insufficient documentation

## 2013-07-23 DIAGNOSIS — Z86711 Personal history of pulmonary embolism: Secondary | ICD-10-CM | POA: Diagnosis present

## 2013-07-23 DIAGNOSIS — R404 Transient alteration of awareness: Secondary | ICD-10-CM | POA: Diagnosis present

## 2013-07-23 DIAGNOSIS — W19XXXA Unspecified fall, initial encounter: Secondary | ICD-10-CM

## 2013-07-23 NOTE — ED Notes (Signed)
PT reported after leaving a friends house He starting sweating and had nausea. Pt Friend stated Pt sat in chair on arrival home . Pt was found on the floor by friend  and he vomited when he woke up .

## 2013-07-23 NOTE — ED Notes (Signed)
Pt placed in gown and in bed. Pt monitored by pulse ox, bp cuff, and 12-lead. 

## 2013-07-23 NOTE — ED Notes (Signed)
CBG 169 per EMS

## 2013-07-24 ENCOUNTER — Observation Stay (HOSPITAL_COMMUNITY): Payer: Medicare Other

## 2013-07-24 ENCOUNTER — Ambulatory Visit: Payer: Medicare Other | Admitting: Neurology

## 2013-07-24 ENCOUNTER — Telehealth: Payer: Self-pay | Admitting: *Deleted

## 2013-07-24 ENCOUNTER — Encounter (HOSPITAL_COMMUNITY): Payer: Self-pay | Admitting: Urology

## 2013-07-24 DIAGNOSIS — Z86711 Personal history of pulmonary embolism: Secondary | ICD-10-CM | POA: Diagnosis not present

## 2013-07-24 DIAGNOSIS — M545 Low back pain, unspecified: Secondary | ICD-10-CM

## 2013-07-24 DIAGNOSIS — J9819 Other pulmonary collapse: Secondary | ICD-10-CM | POA: Diagnosis not present

## 2013-07-24 DIAGNOSIS — I2510 Atherosclerotic heart disease of native coronary artery without angina pectoris: Secondary | ICD-10-CM

## 2013-07-24 DIAGNOSIS — E118 Type 2 diabetes mellitus with unspecified complications: Secondary | ICD-10-CM

## 2013-07-24 DIAGNOSIS — R55 Syncope and collapse: Secondary | ICD-10-CM | POA: Diagnosis not present

## 2013-07-24 DIAGNOSIS — E785 Hyperlipidemia, unspecified: Secondary | ICD-10-CM

## 2013-07-24 DIAGNOSIS — I519 Heart disease, unspecified: Secondary | ICD-10-CM | POA: Diagnosis not present

## 2013-07-24 DIAGNOSIS — I10 Essential (primary) hypertension: Secondary | ICD-10-CM

## 2013-07-24 LAB — URINE MICROSCOPIC-ADD ON

## 2013-07-24 LAB — CBC WITH DIFFERENTIAL/PLATELET
Basophils Absolute: 0 10*3/uL (ref 0.0–0.1)
Basophils Relative: 0 % (ref 0–1)
Eosinophils Absolute: 0 10*3/uL (ref 0.0–0.7)
Eosinophils Relative: 0 % (ref 0–5)
HCT: 36.9 % — ABNORMAL LOW (ref 39.0–52.0)
Hemoglobin: 12.4 g/dL — ABNORMAL LOW (ref 13.0–17.0)
Lymphocytes Relative: 47 % — ABNORMAL HIGH (ref 12–46)
Lymphs Abs: 2.7 10*3/uL (ref 0.7–4.0)
MCH: 30.1 pg (ref 26.0–34.0)
MCHC: 33.6 g/dL (ref 30.0–36.0)
MCV: 89.6 fL (ref 78.0–100.0)
Monocytes Absolute: 0.6 10*3/uL (ref 0.1–1.0)
Monocytes Relative: 11 % (ref 3–12)
Neutro Abs: 2.4 10*3/uL (ref 1.7–7.7)
Neutrophils Relative %: 42 % — ABNORMAL LOW (ref 43–77)
Platelets: 211 10*3/uL (ref 150–400)
RBC: 4.12 MIL/uL — ABNORMAL LOW (ref 4.22–5.81)
RDW: 13.2 % (ref 11.5–15.5)
WBC: 5.7 10*3/uL (ref 4.0–10.5)

## 2013-07-24 LAB — I-STAT CHEM 8, ED
BUN: 17 mg/dL (ref 6–23)
Calcium, Ion: 1.23 mmol/L (ref 1.13–1.30)
Chloride: 100 mEq/L (ref 96–112)
Creatinine, Ser: 1.1 mg/dL (ref 0.50–1.35)
Glucose, Bld: 193 mg/dL — ABNORMAL HIGH (ref 70–99)
HCT: 40 % (ref 39.0–52.0)
Hemoglobin: 13.6 g/dL (ref 13.0–17.0)
Potassium: 4.1 mEq/L (ref 3.7–5.3)
Sodium: 139 mEq/L (ref 137–147)
TCO2: 29 mmol/L (ref 0–100)

## 2013-07-24 LAB — BASIC METABOLIC PANEL
Anion gap: 10 (ref 5–15)
BUN: 15 mg/dL (ref 6–23)
CO2: 29 mEq/L (ref 19–32)
Calcium: 8.8 mg/dL (ref 8.4–10.5)
Chloride: 101 mEq/L (ref 96–112)
Creatinine, Ser: 1.03 mg/dL (ref 0.50–1.35)
GFR calc Af Amer: 82 mL/min — ABNORMAL LOW (ref >90)
GFR calc non Af Amer: 71 mL/min — ABNORMAL LOW (ref >90)
Glucose, Bld: 311 mg/dL — ABNORMAL HIGH (ref 70–99)
Potassium: 4.5 mEq/L (ref 3.7–5.3)
Sodium: 140 mEq/L (ref 137–147)

## 2013-07-24 LAB — GLUCOSE, CAPILLARY
Glucose-Capillary: 264 mg/dL — ABNORMAL HIGH (ref 70–99)
Glucose-Capillary: 266 mg/dL — ABNORMAL HIGH (ref 70–99)
Glucose-Capillary: 356 mg/dL — ABNORMAL HIGH (ref 70–99)
Glucose-Capillary: 124 mg/dL — ABNORMAL HIGH (ref 70–99)

## 2013-07-24 LAB — CBC
HCT: 36 % — ABNORMAL LOW (ref 39.0–52.0)
Hemoglobin: 12 g/dL — ABNORMAL LOW (ref 13.0–17.0)
MCH: 29.9 pg (ref 26.0–34.0)
MCHC: 33.3 g/dL (ref 30.0–36.0)
MCV: 89.6 fL (ref 78.0–100.0)
Platelets: 193 10*3/uL (ref 150–400)
RBC: 4.02 MIL/uL — ABNORMAL LOW (ref 4.22–5.81)
RDW: 13.1 % (ref 11.5–15.5)
WBC: 6 10*3/uL (ref 4.0–10.5)

## 2013-07-24 LAB — URINALYSIS, ROUTINE W REFLEX MICROSCOPIC
Bilirubin Urine: NEGATIVE
Glucose, UA: 500 mg/dL — AB
Hgb urine dipstick: NEGATIVE
Ketones, ur: NEGATIVE mg/dL
Nitrite: NEGATIVE
Protein, ur: NEGATIVE mg/dL
Specific Gravity, Urine: 1.026 (ref 1.005–1.030)
Urobilinogen, UA: 1 mg/dL (ref 0.0–1.0)
pH: 5 (ref 5.0–8.0)

## 2013-07-24 LAB — I-STAT TROPONIN, ED: Troponin i, poc: 0.01 ng/mL (ref 0.00–0.08)

## 2013-07-24 MED ORDER — RAMIPRIL 1.25 MG PO CAPS
1.2500 mg | ORAL_CAPSULE | Freq: Every evening | ORAL | Status: DC
  Administered 2013-07-24: 1.25 mg via ORAL
  Filled 2013-07-24: qty 1

## 2013-07-24 MED ORDER — ADULT MULTIVITAMIN W/MINERALS CH
1.0000 | ORAL_TABLET | Freq: Every evening | ORAL | Status: DC
  Administered 2013-07-24: 1 via ORAL
  Filled 2013-07-24: qty 1

## 2013-07-24 MED ORDER — ACETAMINOPHEN 325 MG PO TABS: 650.0000 mg | ORAL_TABLET | Freq: Four times a day (QID) | ORAL | Status: DC | PRN

## 2013-07-24 MED ORDER — SODIUM CHLORIDE 0.9 % IJ SOLN: 3.0000 mL | INTRAMUSCULAR | Status: DC | PRN

## 2013-07-24 MED ORDER — RIVAROXABAN 20 MG PO TABS
20.0000 mg | ORAL_TABLET | Freq: Every day | ORAL | Status: DC
  Administered 2013-07-24: 20 mg via ORAL
  Filled 2013-07-24: qty 1

## 2013-07-24 MED ORDER — ALUM & MAG HYDROXIDE-SIMETH 200-200-20 MG/5ML PO SUSP: 30.0000 mL | Freq: Four times a day (QID) | ORAL | Status: DC | PRN

## 2013-07-24 MED ORDER — PREGABALIN 75 MG PO CAPS
300.0000 mg | ORAL_CAPSULE | Freq: Two times a day (BID) | ORAL | Status: DC
  Administered 2013-07-24: 300 mg via ORAL
  Filled 2013-07-24: qty 4

## 2013-07-24 MED ORDER — INSULIN DETEMIR 100 UNIT/ML ~~LOC~~ SOLN
30.0000 [IU] | SUBCUTANEOUS | Status: DC
  Administered 2013-07-24: 30 [IU] via SUBCUTANEOUS
  Filled 2013-07-24 (×2): qty 0.3

## 2013-07-24 MED ORDER — ONDANSETRON HCL 4 MG/2ML IJ SOLN: 4.0000 mg | Freq: Four times a day (QID) | INTRAMUSCULAR | Status: DC | PRN

## 2013-07-24 MED ORDER — INSULIN ASPART 100 UNIT/ML ~~LOC~~ SOLN
0.0000 [IU] | Freq: Three times a day (TID) | SUBCUTANEOUS | Status: DC
  Administered 2013-07-24: 9 [IU] via SUBCUTANEOUS
  Administered 2013-07-24: 1 [IU] via SUBCUTANEOUS

## 2013-07-24 MED ORDER — PANTOPRAZOLE SODIUM 40 MG PO TBEC
40.0000 mg | DELAYED_RELEASE_TABLET | Freq: Every day | ORAL | Status: DC
  Administered 2013-07-24: 40 mg via ORAL
  Filled 2013-07-24: qty 1

## 2013-07-24 MED ORDER — PREGABALIN 75 MG PO CAPS
300.0000 mg | ORAL_CAPSULE | Freq: Once | ORAL | Status: AC
  Administered 2013-07-24: 300 mg via ORAL
  Filled 2013-07-24: qty 4

## 2013-07-24 MED ORDER — OXYCODONE HCL 5 MG PO TABS: 5.0000 mg | ORAL_TABLET | ORAL | Status: DC | PRN

## 2013-07-24 MED ORDER — INSULIN ASPART 100 UNIT/ML ~~LOC~~ SOLN: 0.0000 [IU] | Freq: Three times a day (TID) | SUBCUTANEOUS | Status: DC

## 2013-07-24 MED ORDER — SODIUM CHLORIDE 0.9 % IV SOLN: 250.0000 mL | INTRAVENOUS | Status: DC | PRN

## 2013-07-24 MED ORDER — CIPROFLOXACIN HCL 500 MG PO TABS: 500.0000 mg | ORAL_TABLET | Freq: Two times a day (BID) | ORAL | Status: DC

## 2013-07-24 MED ORDER — SODIUM CHLORIDE 0.9 % IJ SOLN
3.0000 mL | Freq: Two times a day (BID) | INTRAMUSCULAR | Status: DC
  Administered 2013-07-24: 3 mL via INTRAVENOUS

## 2013-07-24 MED ORDER — SODIUM CHLORIDE 0.9 % IV BOLUS (SEPSIS)
500.0000 mL | Freq: Once | INTRAVENOUS | Status: AC
  Administered 2013-07-24: 500 mL via INTRAVENOUS

## 2013-07-24 MED ORDER — ONDANSETRON HCL 4 MG PO TABS: 4.0000 mg | ORAL_TABLET | Freq: Four times a day (QID) | ORAL | Status: DC | PRN

## 2013-07-24 MED ORDER — FUROSEMIDE 20 MG PO TABS
20.0000 mg | ORAL_TABLET | Freq: Every day | ORAL | Status: DC | PRN
  Filled 2013-07-24: qty 1

## 2013-07-24 MED ORDER — VITAMIN D3 25 MCG (1000 UNIT) PO TABS
1000.0000 [IU] | ORAL_TABLET | Freq: Every day | ORAL | Status: DC
  Administered 2013-07-24: 1000 [IU] via ORAL
  Filled 2013-07-24: qty 1

## 2013-07-24 MED ORDER — FERROUS SULFATE 325 (65 FE) MG PO TABS
325.0000 mg | ORAL_TABLET | Freq: Every day | ORAL | Status: DC
  Administered 2013-07-24: 325 mg via ORAL
  Filled 2013-07-24 (×2): qty 1

## 2013-07-24 MED ORDER — DUTASTERIDE 0.5 MG PO CAPS
0.5000 mg | ORAL_CAPSULE | Freq: Every day | ORAL | Status: DC
  Administered 2013-07-24: 0.5 mg via ORAL
  Filled 2013-07-24: qty 1

## 2013-07-24 MED ORDER — DULOXETINE HCL 60 MG PO CPEP
60.0000 mg | ORAL_CAPSULE | Freq: Every day | ORAL | Status: DC
  Administered 2013-07-24: 60 mg via ORAL
  Filled 2013-07-24: qty 1

## 2013-07-24 MED ORDER — ACETAMINOPHEN 650 MG RE SUPP: 650.0000 mg | Freq: Four times a day (QID) | RECTAL | Status: DC | PRN

## 2013-07-24 MED ORDER — ATORVASTATIN CALCIUM 20 MG PO TABS
20.0000 mg | ORAL_TABLET | Freq: Every day | ORAL | Status: DC
  Administered 2013-07-24: 20 mg via ORAL
  Filled 2013-07-24: qty 1

## 2013-07-24 MED ORDER — HYDROMORPHONE HCL PF 1 MG/ML IJ SOLN: 0.5000 mg | INTRAMUSCULAR | Status: DC | PRN

## 2013-07-24 MED ORDER — VALACYCLOVIR HCL 500 MG PO TABS
500.0000 mg | ORAL_TABLET | Freq: Every day | ORAL | Status: DC
  Administered 2013-07-24: 500 mg via ORAL
  Filled 2013-07-24: qty 1

## 2013-07-24 MED ORDER — NITROGLYCERIN 0.4 MG SL SUBL: 0.4000 mg | SUBLINGUAL_TABLET | SUBLINGUAL | Status: DC | PRN

## 2013-07-24 NOTE — Consult Note (Signed)
CARDIOLOGY CONSULT NOTE      Patient ID: Steve Brown MRN: [id number] DOB/AGE: 03/26/1940 73 y.o.  Admit date: 07/23/2013 Referring Physician Dr. Darrick Meigs Primary Physician Dr. Melinda Crutch Primary Cardiologist Irish Lack Reason for Consultation syncope  HPI: 73 year old man well known to me. He has a history of coronary artery disease. He has had prior PCI of the LAD. He had a cardiac cath in March 2015. Nonobstructive disease was noted. He also has a history of TIA and DVT with pulmonary embolism. He had been doing well. He had gone out to [employer] and eaten. He had steak, potato and salad.  He then went to a friend's house with his wife. After about 30 minutes, he started feeling unwell. He became nauseated. They decided to go home. While he was in the car, the nausea got worse. He began sweating. He started to feel like his vision was going black. They reached home. He was unable to walk all the way into the house due to feeling weak. He sat down in the [place]. The next thing he remembers, his wife was calling 911. He apparently passed out while he was sitting in a chair. He did not hurt himself. At the time of passing out, he also vomited. He has no recollection of vomiting. He was admitted to the hospital. He is ruled out for MI with. He does not report any palpitations or further weakness. He has walked in his room without any difficulties. He denies any chest discomfort or shortness of breath.  Review of systems complete and found to be negative unless listed above   Past Medical History  Diagnosis Date  . Diabetes mellitus   . Coronary artery disease   . Hypertension   . History of TIAs   . Lumbago   . Benign localized hyperplasia of prostate without urinary obstruction and other lower urinary tract symptoms (LUTS)   . Other and unspecified hyperlipidemia   . Unspecified essential hypertension   . Type II or unspecified type diabetes mellitus without mention of complication, not  stated as uncontrolled   . Personal history of unspecified circulatory disease   . Unspecified fall   . Hypersomnia with sleep apnea, unspecified   . Pain in joint, multiple sites   . Degeneration of cervical intervertebral disc   . Unspecified cardiovascular disease   . Unsteady gait 05/19/2013    X 24 hours - improving. Hx prior CVA/TIAs; multiple risk factors; on Xarelto  . Stroke 2002, 2003    both sided weakness    Family History  Problem Relation Age of Onset  . Aneurysm Mother   . Cancer Father     History   Social History  . Marital Status: Divorced    Spouse Name: N/A    Number of Children: 67  . Years of Education: 12   Occupational History  .      retired   Social History Main Topics  . Smoking status: Never Smoker   . Smokeless tobacco: Never Used  . Alcohol Use: No  . Drug Use: No  . Sexual Activity: Not on file   Other Topics Concern  . Not on file   Social History Narrative   Patient lives at home alone and he is single.  Patient is retired.    Caffeine - one cups daily.   Right handed.   Patient has a high school education.   Patient has four adult children.    Past Surgical History  Procedure Laterality  Date  . Hernia repair    . Carpal tunnel release      bilateral  . Elbow surgery      right elbow - nerve release  . Cervical disc surgery    . Lungs      "fluid pumped off lungs"  . Coronary stent placement  Feb. 2010  . Cardiac catheterization    . Coronary angioplasty       Prescriptions prior to admission  Medication Sig Dispense Refill  . Ascorbic Acid (VITAMIN C PO) Take 1 tablet by mouth daily.      . cholecalciferol (VITAMIN D) 1000 UNITS tablet Take 1,000 Units by mouth daily.      . diclofenac sodium (VOLTAREN) 1 % GEL Apply 2 g topically 3 (three) times daily as needed (pain).      . DULoxetine (CYMBALTA) 60 MG capsule Take 60 mg by mouth daily.      Marland Kitchen dutasteride (AVODART) 0.5 MG capsule Take 0.5 mg by mouth daily.      .  ferrous sulfate 325 (65 FE) MG tablet Take 325 mg by mouth daily with breakfast.      . fluticasone (FLONASE) 50 MCG/ACT nasal spray Place 2 sprays into both nostrils daily as needed for allergies.       . furosemide (LASIX) 20 MG tablet Take 20 mg by mouth daily as needed (swelling).       . insulin detemir (LEVEMIR) 100 UNIT/ML injection Inject 30 Units into the skin every morning.       . metFORMIN (GLUCOPHAGE) 500 MG tablet Take 2 tablets (1,000 mg total) by mouth 2 (two) times daily with a meal.      . Multiple Vitamin (MULTIVITAMIN WITH MINERALS) TABS Take 1 tablet by mouth every evening.       . nitroGLYCERIN (NITROSTAT) 0.4 MG SL tablet Place 1 tablet (0.4 mg total) under the tongue every 5 (five) minutes as needed for chest pain.  25 tablet  12  . pantoprazole (PROTONIX) 40 MG tablet Take 40 mg by mouth daily.      . pregabalin (LYRICA) 300 MG capsule Take 1 capsule (300 mg total) by mouth 2 (two) times daily.  180 capsule  3  . ramipril (ALTACE) 2.5 MG capsule Take 1.25 mg by mouth every evening.       . repaglinide (PRANDIN) 2 MG tablet Take 2 mg by mouth 3 (three) times daily before meals.      . Rivaroxaban (XARELTO) 20 MG TABS tablet Take 1 tablet (20 mg total) by mouth daily with supper.  30 tablet    . rosuvastatin (CRESTOR) 10 MG tablet Take 10 mg by mouth every evening.       . tapentadol (NUCYNTA) 50 MG TABS Take 50 mg by mouth 3 (three) times daily as needed (for pain).      . valACYclovir (VALTREX) 500 MG tablet Take 500 mg by mouth daily.        Physical Exam: Vitals:   Filed Vitals:   07/24/13 0230 07/24/13 0311 07/24/13 0735 07/24/13 1144  BP: 143/74 154/76  135/83  Pulse: 66 69  61  Temp:  97.4 F (36.3 C) 97.8 F (36.6 C) 97.4 F (36.3 C)  TempSrc:  Oral Oral Oral  Resp: 12 18  16   Height:  5\' 5"  (1.651 m)    Weight:  172 lb 8 oz (78.245 kg)    SpO2: 99% 99% 97% 99%   I&O's:   Intake/Output Summary (Last  24 hours) at 07/24/13 1155 Last data filed at  07/24/13 0900  Gross per 24 hour  Intake    260 ml  Output    600 ml  Net   -340 ml   Physical exam:  [place]/AT EOMI No JVD, No carotid bruit RRR S1S2  No wheezing Soft. NT, nondistended No edema. No gross focal motor or sensory deficits Normal affect  Labs:   Lab Results  Component Value Date   WBC 6.0 07/24/2013   HGB 12.0* 07/24/2013   HCT 36.0* 07/24/2013   MCV 89.6 07/24/2013   PLT 193 07/24/2013    Recent Labs Lab 07/24/13 0600  NA 140  K 4.5  CL 101  CO2 29  BUN 15  CREATININE 1.03  CALCIUM 8.8  GLUCOSE 311*   Lab Results  Component Value Date   CKTOTAL 103 11/14/2009   CKMB 2.2 11/14/2009   TROPONINI <0.30 03/21/2013    Lab Results  Component Value Date   CHOL 144 03/21/2013   CHOL 167 07/19/2011   Lab Results  Component Value Date   HDL 57 03/21/2013   HDL 60 07/19/2011   Lab Results  Component Value Date   LDLCALC 63 03/21/2013   LDLCALC 91 07/19/2011   Lab Results  Component Value Date   TRIG 122 03/21/2013   TRIG 80 07/19/2011   Lab Results  Component Value Date   CHOLHDL 2.5 03/21/2013   CHOLHDL 2.8 07/19/2011   No results found for this basename: LDLDIRECT       EKG: Normal sinus rhythm, early repolarization ASSESSMENT AND PLAN:   Syncope, CAD  1) echocardiogram pending. If this is normal, would not plan any further cardiac workup. I suspect his syncope was related to a vagal response. He had a long prodrome including sweating, nausea and eventual vomiting. I encouraged him to stay well hydrated. He should try to lie down if he has any symptoms like this prodrome.  2) CAD: No ischemia workup needed at this time. He had a recent cardiac cath.  3) follow telemetry was in the hospital. He wants to go home. He feels back to baseline. Signed:   Mina Marble, MD, [facility] 07/24/2013, 11:55 AM

## 2013-07-24 NOTE — Telephone Encounter (Signed)
FYI, previous note to make you aware. Thanks

## 2013-07-24 NOTE — Progress Notes (Signed)
Utilization review completed.  

## 2013-07-24 NOTE — H&P (Signed)
[facility] Admission History and Physical       Steve Brown WVP:[id number] DOB: 1940/05/12 DOA: 07/23/2013  Referring physician:  EDP PCP: Gus Height, MD  Specialists:   Chief Complaint: Passed Out  HPI: Steve Brown is a 73 y.o. male with a history of CAD, DM2, HTN,Hyperlipidemia, and previous CVAs with ,mild residual right hemiparesis who presnt to the ED with complaints of passing out.   He reports that he began to have dizziness and diaphoresis while he was at a friends house.   He states that he drove home and, when he got out of his car, he passed out onto the driveway.  His friend  came out of the house and found him on the ground, he reports that he had been there  2 minutes.    He denies having associated chest pain or headache.   He was referred for medical admission for  further evaluation.     Review of Systems:  Constitutional: No Weight Loss, No Weight Gain, Night Sweats, Fevers, Chills, Dizziness, Fatigue, or Generalized Weakness HEENT: No Headaches, Difficulty Swallowing,Tooth/Dental Problems,Sore Throat,  No Sneezing, Rhinitis, Ear Ache, Nasal Congestion, or Post Nasal Drip,  Cardio-vascular:  No Chest pain, Orthopnea, PND, Edema in Lower Extremities, Anasarca, +Dizziness, Palpitations  Resp: No Dyspnea, No DOE, No Cough, No Hemoptysis, No Wheezing.    GI: No Heartburn, Indigestion, Abdominal Pain, Nausea, Vomiting, Diarrhea, Hematemesis, Hematochezia, Melena, Change in Bowel Habits,  Loss of Appetite  GU: No Dysuria, Change in Color of Urine, No Urgency or Frequency, No Flank pain.  Musculoskeletal: No Joint Pain or Swelling, No Decreased Range of Motion, No Back Pain.  Neurologic:  +Syncope, No Seizures, Muscle Weakness, Paresthesia, Vision Disturbance or Loss, No Diplopia, No Vertigo, +Difficulty Walking( Chronic),  Skin: No Rash or Lesions. Psych: No Change in Mood or Affect, No Depression or Anxiety, No Memory loss, No Confusion, or  Hallucinations   Past Medical History  Diagnosis Date  . Diabetes mellitus   . Coronary artery disease   . Hypertension   . History of TIAs   . Lumbago   . Benign localized hyperplasia of prostate without urinary obstruction and other lower urinary tract symptoms (LUTS)   . Other and unspecified hyperlipidemia   . Unspecified essential hypertension   . Type II or unspecified type diabetes mellitus without mention of complication, not stated as uncontrolled   . Personal history of unspecified circulatory disease   . Unspecified fall   . Hypersomnia with sleep apnea, unspecified   . Pain in joint, multiple sites   . Degeneration of cervical intervertebral disc   . Unspecified cardiovascular disease   . Unsteady gait 05/19/2013    X 24 hours - improving. Hx prior CVA/TIAs; multiple risk factors; on Xarelto  . Stroke 2002, 2003    both sided weakness     Past Surgical History  Procedure Laterality Date  . Hernia repair    . Carpal tunnel release      bilateral  . Elbow surgery      right elbow - nerve release  . Cervical disc surgery    . Lungs      "fluid pumped off lungs"  . Coronary stent placement  Feb. 2010  . Cardiac catheterization    . Coronary angioplasty        Prior to Admission medications   Medication Sig Start Date End Date Taking? Authorizing Provider  Ascorbic Acid (VITAMIN C PO) Take 1 tablet by mouth daily.  Yes Historical Provider, MD  cholecalciferol (VITAMIN D) 1000 UNITS tablet Take 1,000 Units by mouth daily.   Yes Historical Provider, MD  diclofenac sodium (VOLTAREN) 1 % GEL Apply 2 g topically 3 (three) times daily as needed (pain).   Yes Historical Provider, MD  DULoxetine (CYMBALTA) 60 MG capsule Take 60 mg by mouth daily.   Yes Historical Provider, MD  dutasteride (AVODART) 0.5 MG capsule Take 0.5 mg by mouth daily.   Yes Historical Provider, MD  ferrous sulfate 325 (65 FE) MG tablet Take 325 mg by mouth daily with breakfast.   Yes Historical  Provider, MD  fluticasone (FLONASE) 50 MCG/ACT nasal spray Place 2 sprays into both nostrils daily as needed for allergies.  05/30/13  Yes Historical Provider, MD  furosemide (LASIX) 20 MG tablet Take 20 mg by mouth daily as needed (swelling).    Yes Historical Provider, MD  insulin detemir (LEVEMIR) 100 UNIT/ML injection Inject 30 Units into the skin every morning.    Yes Historical Provider, MD  metFORMIN (GLUCOPHAGE) 500 MG tablet Take 2 tablets (1,000 mg total) by mouth 2 (two) times daily with a meal. 04/05/13  Yes Jettie Booze, MD  Multiple Vitamin (MULTIVITAMIN WITH MINERALS) TABS Take 1 tablet by mouth every evening.    Yes Historical Provider, MD  nitroGLYCERIN (NITROSTAT) 0.4 MG SL tablet Place 1 tablet (0.4 mg total) under the tongue every 5 (five) minutes as needed for chest pain. 04/03/13  Yes Jettie Booze, MD  pantoprazole (PROTONIX) 40 MG tablet Take 40 mg by mouth daily.   Yes Historical Provider, MD  pregabalin (LYRICA) 300 MG capsule Take 1 capsule (300 mg total) by mouth 2 (two) times daily. 07/26/12  Yes Carmen Dohmeier, MD  ramipril (ALTACE) 2.5 MG capsule Take 1.25 mg by mouth every evening.    Yes Historical Provider, MD  repaglinide (PRANDIN) 2 MG tablet Take 2 mg by mouth 3 (three) times daily before meals.   Yes Historical Provider, MD  Rivaroxaban (XARELTO) 20 MG TABS tablet Take 1 tablet (20 mg total) by mouth daily with supper. 04/05/13  Yes Jettie Booze, MD  rosuvastatin (CRESTOR) 10 MG tablet Take 10 mg by mouth every evening.    Yes Historical Provider, MD  tapentadol (NUCYNTA) 50 MG TABS Take 50 mg by mouth 3 (three) times daily as needed (for pain).   Yes Historical Provider, MD  valACYclovir (VALTREX) 500 MG tablet Take 500 mg by mouth daily.   Yes Historical Provider, MD     Allergies  Allergen Reactions  . Phenergan [Promethazine] Other (See Comments)    Mood changes     Social History:  reports that he has never smoked. He has never used  smokeless tobacco. He reports that he does not drink alcohol or use illicit drugs.     Family History  Problem Relation Age of Onset  . Aneurysm Mother   . Cancer Father       Physical Exam:  GEN:  Pleasant Elderly Obese 73 y.o. African [place] male examined and in no acute distress; cooperative with exam Filed Vitals:   07/24/13 0130 07/24/13 0200 07/24/13 0230 07/24/13 0311  BP: 127/64 133/66 143/74 154/76  Pulse: 71 72 66 69  Temp:    97.4 F (36.3 C)  TempSrc:    Oral  Resp: 29 14 12 18   Height:    5\' 5"  (1.651 m)  Weight:    78.245 kg (172 lb 8 oz)  SpO2: 95% 98% 99%  99%   Blood pressure 154/76, pulse 69, temperature 97.4 F (36.3 C), temperature source Oral, resp. rate 18, height 5\' 5"  (1.651 m), weight 78.245 kg (172 lb 8 oz), SpO2 99.00%. PSYCH: He is alert and oriented x4; does not appear anxious does not appear depressed; affect is normal HEENT: Normocephalic and Atraumatic, Mucous membranes pink; PERRLA; EOM intact; Fundi:  Benign;  No scleral icterus, Nares: Patent, Oropharynx: Clear,  Fair Dentition,    Neck:  FROM, No Cervical Lymphadenopathy nor Thyromegaly or Carotid Bruit; No JVD; Breasts:: Not examined CHEST WALL: No tenderness CHEST: Normal respiration, clear to auscultation bilaterally HEART: Regular rate and rhythm; no murmurs rubs or gallops BACK: No kyphosis or scoliosis; No CVA tenderness ABDOMEN: Positive Bowel Sounds, Obese, Soft Non-Tender; No Masses, No Organomegaly. Rectal Exam: Not done EXTREMITIES: No Cyanosis, Clubbing, or Edema; No Ulcerations. Genitalia: not examined PULSES: 2+ and symmetric SKIN: Normal hydration no rash or ulceration CNS:  Alert and Oriented x 4, No  Acute Focal Deficits,   Mild Gait: deferred   Vascular: pulses palpable throughout    Labs on Admission:  Basic Metabolic Panel:  Recent Labs Lab 07/24/13 0032  NA 139  K 4.1  CL 100  GLUCOSE 193*  BUN 17  CREATININE 1.10   Liver Function Tests: No results  found for this basename: AST, ALT, ALKPHOS, BILITOT, PROT, ALBUMIN,  in the last 168 hours No results found for this basename: LIPASE, AMYLASE,  in the last 168 hours No results found for this basename: AMMONIA,  in the last 168 hours CBC:  Recent Labs Lab 07/24/13 0026 07/24/13 0032  WBC 5.7  --   NEUTROABS 2.4  --   HGB 12.4* 13.6  HCT 36.9* 40.0  MCV 89.6  --   PLT 211  --    Cardiac Enzymes: No results found for this basename: CKTOTAL, CKMB, CKMBINDEX, TROPONINI,  in the last 168 hours  BNP (last 3 results)  Recent Labs  10/08/12 1523 03/20/13 1300  PROBNP 57.6 34.7   CBG:  Recent Labs Lab 07/24/13 0313  GLUCAP 264*    Radiological Exams on Admission: Dg Chest 2 View  07/24/2013   CLINICAL DATA:  Syncope  EXAM: CHEST  2 VIEW  COMPARISON:  March 20, 2013  FINDINGS: There is a questionable nipple shadow at the right base. There is mild left base atelectasis. Elsewhere lungs are clear. Heart size and pulmonary vascularity are normal. No adenopathy. There is atherosclerotic change in aorta. There is postoperative change in the lower cervical spine. There is calcification in each carotid artery.  IMPRESSION: Question nipple shadow right base. Advise repeat study with nipple markers to confirm that this opacity is a nipple shadow.  Left base atelectasis.  No edema or consolidation.  There is carotid artery calcification bilaterally.   Electronically Signed   By: Lowella Grip M.D.   On: 07/24/2013 02:09     EKG: Independently reviewed.    Assessment/Plan:   73 y.o. male with  Principal Problem: 1.   Syncope:     Admitted to telemetry Bed for syncope workup,  Cardiac monitoring,    Cycle Troponins,  Check Orthostatics, Neuro checks.      Active Problems: 2.   Diabetes mellitus type 2 with complications:    Hold Metformin and Prandin rx,  Continue Lantus Insulin,   and Add SSI coverage PRN.  Diabetic Diet.     3.   HTN (hypertension):    Continue Ramipril Rx  ,  And Lasix Rx,  Monitor BPs.     4.  CAD in native artery:    Stable   5.  History of pulmonary embolism: June 2014,    Takes Xarelto:     6.  Lumbago:     Pain Rx PRN.  On Nucynta and Lyrica Rx at Home.     7.  Other and unspecified hyperlipidemia:    Continue Statin Rx, on Crestor at home.     8.  DVT Prophylaxis:      On Xarelto Rx.        Code Status:  FULL CODE     Family Communication:  No Family at Bedside    Disposition Plan:           Time spent:  [place] [facility] Pager [phone]   If [place] Please Contact the Day Rounding Team MD for [facility]  If 7PM-7AM, Please Contact night-coverage  [email or web address] Password [facility] 07/24/2013, 5:34 AM

## 2013-07-24 NOTE — Progress Notes (Signed)
Pt discharged to home per MD order. Pt received and reviewed all discharge instructions and medication information including follow-up appointments and prescription information. Pt verbalized understanding. Pt alert and oriented at discharge with no complaints of pain. Pt IV and telemetry box removed prior to discharge. Pt escorted to private vehicle via wheelchair by nurse tech. Steve Brown  

## 2013-07-24 NOTE — Progress Notes (Signed)
  Echocardiogram 2D Echocardiogram has been performed.  Steve Brown 07/24/2013, 4:20 PM

## 2013-07-24 NOTE — ED Provider Notes (Signed)
CSN: [id number]     Arrival date & time 07/23/13  2254 History   First MD Initiated Contact with Patient 07/24/13 0019     Chief Complaint  Patient presents with  . Nausea  . Emesis  . Loss of Consciousness     (Consider location/radiation/quality/duration/timing/severity/associated sxs/prior Treatment) HPI Comments: Patient presents with syncopal episode. He has a history of diabetes, coronary artery disease status post stent in 2010. He also has a history of PE and is on Xarelto. He hasn't missed any doses of Xarelto. He states today he went out to dinner and when he was driving back from dinner to his friend's house, he started becoming diaphoretic and nauseated. He was feeling lightheaded. When he got to his friend's house he sat down on a chair in the [place] and had a syncopal episode. This lasted a couple of minutes per bystanders. He vomited x1. He did not fall out of chair. He did not hit his head. He says he feels better now. He denies ever having any chest tightness or any chest discomfort. He denies shortness of breath. He denies any palpitations. He denies any abdominal pain. He did have some blurry vision prior to the syncopal episode but denies any currently. He denies any numbness or weakness in his extremities. He denies any syncopal episodes in the past however on review of his records, he had a syncopal episode in March. He had a catheterization at that time which did show some diffuse disease and he's been managed medically. He denies any recent illnesses. He denies any leg swelling or calf tenderness.  Patient is a 73 y.o. male presenting with vomiting and syncope.  Emesis Associated symptoms: no abdominal pain, no arthralgias, no chills, no diarrhea and no headaches   Loss of Consciousness Associated symptoms: diaphoresis, dizziness, nausea and vomiting   Associated symptoms: no chest pain, no fever, no headaches, no shortness of breath and no weakness     Past Medical  History  Diagnosis Date  . Diabetes mellitus   . Coronary artery disease   . Hypertension   . History of TIAs   . Lumbago   . Benign localized hyperplasia of prostate without urinary obstruction and other lower urinary tract symptoms (LUTS)   . Other and unspecified hyperlipidemia   . Unspecified essential hypertension   . Type II or unspecified type diabetes mellitus without mention of complication, not stated as uncontrolled   . Personal history of unspecified circulatory disease   . Unspecified fall   . Hypersomnia with sleep apnea, unspecified   . Pain in joint, multiple sites   . Degeneration of cervical intervertebral disc   . Unspecified cardiovascular disease   . Unsteady gait 05/19/2013    X 24 hours - improving. Hx prior CVA/TIAs; multiple risk factors; on Xarelto  . Stroke 2002, 2003    both sided weakness   Past Surgical History  Procedure Laterality Date  . Hernia repair    . Carpal tunnel release      bilateral  . Elbow surgery      right elbow - nerve release  . Cervical disc surgery    . Lungs      "fluid pumped off lungs"  . Coronary stent placement  Feb. 2010  . Cardiac catheterization    . Coronary angioplasty     Family History  Problem Relation Age of Onset  . Aneurysm Mother   . Cancer Father    History  Substance Use Topics  .  Smoking status: Never Smoker   . Smokeless tobacco: Never Used  . Alcohol Use: No    Review of Systems  Constitutional: Positive for diaphoresis. Negative for fever, chills and fatigue.  HENT: Negative for congestion, rhinorrhea and sneezing.   Eyes: Negative.   Respiratory: Negative for cough, chest tightness and shortness of breath.   Cardiovascular: Positive for syncope. Negative for chest pain and leg swelling.  Gastrointestinal: Positive for nausea and vomiting. Negative for abdominal pain, diarrhea and blood in stool.  Genitourinary: Negative for frequency, hematuria, flank pain and difficulty urinating.   Musculoskeletal: Negative for arthralgias and back pain.  Skin: Negative for rash.  Neurological: Positive for dizziness, syncope and light-headedness. Negative for speech difficulty, weakness, numbness and headaches.      Allergies  Phenergan  Home Medications   Prior to Admission medications   Medication Sig Start Date End Date Taking? Authorizing Provider  Ascorbic Acid (VITAMIN C PO) Take 1 tablet by mouth daily.   Yes Historical Provider, MD  cholecalciferol (VITAMIN D) 1000 UNITS tablet Take 1,000 Units by mouth daily.   Yes Historical Provider, MD  diclofenac sodium (VOLTAREN) 1 % GEL Apply 2 g topically 3 (three) times daily as needed (pain).   Yes Historical Provider, MD  DULoxetine (CYMBALTA) 60 MG capsule Take 60 mg by mouth daily.   Yes Historical Provider, MD  dutasteride (AVODART) 0.5 MG capsule Take 0.5 mg by mouth daily.   Yes Historical Provider, MD  ferrous sulfate 325 (65 FE) MG tablet Take 325 mg by mouth daily with breakfast.   Yes Historical Provider, MD  fluticasone (FLONASE) 50 MCG/ACT nasal spray Place 2 sprays into both nostrils daily as needed for allergies.  05/30/13  Yes Historical Provider, MD  furosemide (LASIX) 20 MG tablet Take 20 mg by mouth daily as needed (swelling).    Yes Historical Provider, MD  insulin detemir (LEVEMIR) 100 UNIT/ML injection Inject 30 Units into the skin every morning.    Yes Historical Provider, MD  metFORMIN (GLUCOPHAGE) 500 MG tablet Take 2 tablets (1,000 mg total) by mouth 2 (two) times daily with a meal. 04/05/13  Yes Jettie Booze, MD  Multiple Vitamin (MULTIVITAMIN WITH MINERALS) TABS Take 1 tablet by mouth every evening.    Yes Historical Provider, MD  nitroGLYCERIN (NITROSTAT) 0.4 MG SL tablet Place 1 tablet (0.4 mg total) under the tongue every 5 (five) minutes as needed for chest pain. 04/03/13  Yes Jettie Booze, MD  pantoprazole (PROTONIX) 40 MG tablet Take 40 mg by mouth daily.   Yes Historical Provider, MD   pregabalin (LYRICA) 300 MG capsule Take 1 capsule (300 mg total) by mouth 2 (two) times daily. 07/26/12  Yes Carmen Dohmeier, MD  ramipril (ALTACE) 2.5 MG capsule Take 1.25 mg by mouth every evening.    Yes Historical Provider, MD  repaglinide (PRANDIN) 2 MG tablet Take 2 mg by mouth 3 (three) times daily before meals.   Yes Historical Provider, MD  Rivaroxaban (XARELTO) 20 MG TABS tablet Take 1 tablet (20 mg total) by mouth daily with supper. 04/05/13  Yes Jettie Booze, MD  rosuvastatin (CRESTOR) 10 MG tablet Take 10 mg by mouth every evening.    Yes Historical Provider, MD  tapentadol (NUCYNTA) 50 MG TABS Take 50 mg by mouth 3 (three) times daily as needed (for pain).   Yes Historical Provider, MD  valACYclovir (VALTREX) 500 MG tablet Take 500 mg by mouth daily.   Yes Historical Provider, MD  BP 154/76  Pulse 69  Temp(Src) 97.4 F (36.3 C) (Oral)  Resp 18  Ht 5\' 5"  (1.651 m)  Wt 172 lb 8 oz (78.245 kg)  BMI 28.71 kg/m2  SpO2 99% Physical Exam  Constitutional: He is oriented to person, place, and time. He appears well-developed and well-nourished.  HENT:  Head: Normocephalic and atraumatic.  Eyes: Pupils are equal, round, and reactive to light.  Neck: Normal range of motion. Neck supple.  Cardiovascular: Normal rate, regular rhythm and normal heart sounds.   Pulmonary/Chest: Effort normal and breath sounds normal. No respiratory distress. He has no wheezes. He has no rales. He exhibits no tenderness.  Abdominal: Soft. Bowel sounds are normal. There is no tenderness. There is no rebound and no guarding.  Musculoskeletal: Normal range of motion. He exhibits no edema.  Lymphadenopathy:    He has no cervical adenopathy.  Neurological: He is alert and oriented to person, place, and time. He has normal strength. No cranial nerve deficit or sensory deficit. GCS eye subscore is 4. GCS verbal subscore is 5. GCS motor subscore is 6.  Skin: Skin is warm and dry. No rash noted.   Psychiatric: He has a normal mood and affect.    ED Course  Procedures (including critical care time) Labs Review Results for orders placed during the hospital encounter of 07/23/13  CBC WITH DIFFERENTIAL      Result Value Ref Range   WBC 5.7  4.0 - 10.5 K/uL   RBC 4.12 (*) 4.22 - 5.81 MIL/uL   Hemoglobin 12.4 (*) 13.0 - 17.0 g/dL   HCT 36.9 (*) 39.0 - 52.0 %   MCV 89.6  78.0 - 100.0 fL   MCH 30.1  26.0 - 34.0 pg   MCHC 33.6  30.0 - 36.0 g/dL   RDW 13.2  11.5 - 15.5 %   Platelets 211  150 - 400 K/uL   Neutrophils Relative % 42 (*) 43 - 77 %   Neutro Abs 2.4  1.7 - 7.7 K/uL   Lymphocytes Relative 47 (*) 12 - 46 %   Lymphs Abs 2.7  0.7 - 4.0 K/uL   Monocytes Relative 11  3 - 12 %   Monocytes Absolute 0.6  0.1 - 1.0 K/uL   Eosinophils Relative 0  0 - 5 %   Eosinophils Absolute 0.0  0.0 - 0.7 K/uL   Basophils Relative 0  0 - 1 %   Basophils Absolute 0.0  0.0 - 0.1 K/uL  URINALYSIS, ROUTINE W REFLEX MICROSCOPIC      Result Value Ref Range   Color, Urine YELLOW  YELLOW   APPearance CLOUDY (*) CLEAR   Specific Gravity, Urine 1.026  1.005 - 1.030   pH 5.0  5.0 - 8.0   Glucose, UA 500 (*) NEGATIVE mg/dL   Hgb urine dipstick NEGATIVE  NEGATIVE   Bilirubin Urine NEGATIVE  NEGATIVE   Ketones, ur NEGATIVE  NEGATIVE mg/dL   Protein, ur NEGATIVE  NEGATIVE mg/dL   Urobilinogen, UA 1.0  0.0 - 1.0 mg/dL   Nitrite NEGATIVE  NEGATIVE   Leukocytes, UA MODERATE (*) NEGATIVE  URINE MICROSCOPIC-ADD ON      Result Value Ref Range   Squamous Epithelial / LPF FEW (*) RARE   WBC, UA 21-50  <3 WBC/hpf   RBC / HPF 3-6  <3 RBC/hpf   Bacteria, UA RARE  RARE   Casts HYALINE CASTS (*) NEGATIVE   Urine-Other MUCOUS PRESENT    CBC  Result Value Ref Range   WBC 6.0  4.0 - 10.5 K/uL   RBC 4.02 (*) 4.22 - 5.81 MIL/uL   Hemoglobin 12.0 (*) 13.0 - 17.0 g/dL   HCT 36.0 (*) 39.0 - 52.0 %   MCV 89.6  78.0 - 100.0 fL   MCH 29.9  26.0 - 34.0 pg   MCHC 33.3  30.0 - 36.0 g/dL   RDW 13.1  11.5 - 15.5  %   Platelets 193  150 - 400 K/uL  GLUCOSE, CAPILLARY      Result Value Ref Range   Glucose-Capillary 264 (*) 70 - 99 mg/dL  I-STAT CHEM 8, ED      Result Value Ref Range   Sodium 139  137 - 147 mEq/L   Potassium 4.1  3.7 - 5.3 mEq/L   Chloride 100  96 - 112 mEq/L   BUN 17  6 - 23 mg/dL   Creatinine, Ser 1.10  0.50 - 1.35 mg/dL   Glucose, Bld 193 (*) 70 - 99 mg/dL   Calcium, Ion 1.23  1.13 - 1.30 mmol/L   TCO2 29  0 - 100 mmol/L   Hemoglobin 13.6  13.0 - 17.0 g/dL   HCT 40.0  39.0 - 52.0 %  I-STAT TROPOININ, ED      Result Value Ref Range   Troponin i, poc 0.01  0.00 - 0.08 ng/mL   Comment 3            Mr Brain Wo Contrast  07/07/2013   [facility]  NEUROIMAGING REPORT   STUDY DATE: 07/04/13 PATIENT NAME: Jowell Bossi DOB: 1940/10/31 MRN: [id number]  ORDERING CLINICIAN: Larey Seat, MD  CLINICAL HISTORY: 73 year old male with confusion and left arm pain.  EXAM: MRI brain (without)  TECHNIQUE: MRI of the brain without contrast was obtained utilizing 5 mm  axial slices with T1, T2, T2 flair, SWI and diffusion weighted views.  T1  sagittal and T2 coronal views were obtained. CONTRAST: no IMAGING SITE: [facility] [address] (1.5 Tesla MRI)    FINDINGS:  No abnormal lesions are seen on diffusion-weighted views to suggest acute  ischemia. The cortical sulci, fissures and cisterns are normal in size and  appearance. Lateral, third and fourth ventricle are normal in size and  appearance. No extra-axial fluid collections are seen. No evidence of mass  effect or midline shift.  Few scattered periventricular and subcortical  and pontine and cerebellar chronic small vessel ischemic disease and small  lacunar infarcts.   On sagittal views the posterior fossa, pituitary gland and corpus callosum  are unremarkable. No evidence of intracranial hemorrhage on SWI views. The  orbits and their contents, paranasal sinuses and calvarium are notable for  post-surgical  orbits.  Intracranial flow voids are present.     07/07/2013   Abnormal MRI brain (without) demonstrating: 1. Few scattered periventricular and subcortical and pontine and  cerebellar chronic small vessel ischemic disease and small lacunar  infarcts.  2. No acute findings.  3. No change from CT on 05/20/13.   INTERPRETING PHYSICIAN:  Penni Bombard, MD Certified in Neurology, Neurophysiology and Neuroimaging  [facility] [address], [place], [place] [address] [phone]      Imaging Review Dg Chest 2 View  07/24/2013   CLINICAL DATA:  Syncope  EXAM: CHEST  2 VIEW  COMPARISON:  March 20, 2013  FINDINGS: There is a questionable nipple shadow at the right base. There is mild  left base atelectasis. Elsewhere lungs are clear. Heart size and pulmonary vascularity are normal. No adenopathy. There is atherosclerotic change in aorta. There is postoperative change in the lower cervical spine. There is calcification in each carotid artery.  IMPRESSION: Question nipple shadow right base. Advise repeat study with nipple markers to confirm that this opacity is a nipple shadow.  Left base atelectasis.  No edema or consolidation.  There is carotid artery calcification bilaterally.   Electronically Signed   By: Lowella Grip M.D.   On: 07/24/2013 02:09     EKG Interpretation   Date/Time:  Thursday July 24 2013 00:24:11 EDT Ventricular Rate:  66 PR Interval:  153 QRS Duration: 93 QT Interval:  411 QTC Calculation: 431 R Axis:   -22 Text Interpretation:  Sinus rhythm Borderline left axis deviation Low  voltage, precordial leads Abnormal R-wave progression, early transition  Minimal ST elevation, anterior leads since last tracing no significant  change Confirmed by Iantha Titsworth  MD, Dael Howland ([id number]) on 07/24/2013 12:31:57 AM      MDM   Final diagnoses:  Syncope, unspecified syncope type    Patient with syncopal episode. His EKG did not show ischemic changes or arrhythmias.  Given his underlying risk factors and age, I felt he needed to be admitted for further evaluation. I spoke with Dr. Hiram Comber with the [facility] to admit the patient.    Malvin Johns, MD 07/24/13 [phone]

## 2013-07-24 NOTE — Discharge Summary (Addendum)
Physician Discharge Summary  Burrel Legrand WLN:[id number] DOB: 05-15-1940 DOA: 07/23/2013  PCP: Gus Height, MD  Admit date: 07/23/2013 Discharge date: 07/24/2013  Time spent: 37* minutes  Recommendations for Outpatient Follow-up:  1. *Follow up PCP in 2 weeks  Discharge Diagnoses:  Principal Problem:   Syncope Active Problems:   Diabetes mellitus type 2 with complications   HTN (hypertension)   CAD in native artery   History of pulmonary embolism: June 2014,  Takes Xarelto   Lumbago   Other and unspecified hyperlipidemia   Discharge Condition: Stable  Diet recommendation: *Low salt diet  Filed Weights   07/24/13 0311  Weight: 78.245 kg (172 lb 8 oz)    History of present illness:  73 y.o. male with a history of CAD, DM2, HTN,Hyperlipidemia, and previous CVAs with ,mild residual right hemiparesis who presnt to the ED with complaints of passing out.  He reports that he began to have dizziness and diaphoresis while he was at a friends house. He states that he drove home and, when he got out of his car, he passed out onto the driveway. His friend  came out of the house and found him on the ground, he reports that he had been there 2 minutes. He denies having associated chest pain or headache. He was referred for medical admission for  further evaluation.    Hospital Course:  Syncope- Likely vasovagal, was seen by cardiology. Npo need of holter monitor. Echo shows no WMA. It shows grade 1 diastolic dysfunction.  CAD- Had recent cardiac cath , no further ischemic work up per cardiology.  H/oDiabetes mellitus type 2 with complications:  Continue Metformin and Prandin rx, Continue Lantus Insulin,     HTN (hypertension):  Continue Ramipril Rx , And Lasix Rx, Monitor BPs.    History of pulmonary embolism: June 2014,  Takes Xarelto:    Lumbago:  Pain Rx PRN. On Nucynta and Lyrica Rx at Home.    Unspecified hyperlipidemia:  Continue Statin Rx, on Crestor at home.   ?  UTI - Patient has abnormal UA, started on Cipro 500 mg po BID for seven days. Did not want to stay for urine culture.    Procedures:  *Echo  Consultations:  Cardiology  Discharge Exam: Filed Vitals:   07/24/13 1626  BP: 132/71  Pulse:   Temp:   Resp:       Discharge Instructions You were cared for by a hospitalist during your hospital stay. If you have any questions about your discharge medications or the care you received while you were in the hospital after you are discharged, you can call the unit and asked to speak with the hospitalist on call if the hospitalist that took care of you is not available. Once you are discharged, your primary care physician will handle any further medical issues. Please note that NO REFILLS for any discharge medications will be authorized once you are discharged, as it is imperative that you return to your primary care physician (or establish a relationship with a primary care physician if you do not have one) for your aftercare needs so that they can reassess your need for medications and monitor your lab values.  Discharge Instructions   Diet - low sodium heart healthy    Complete by:  As directed      Increase activity slowly    Complete by:  As directed             Medication List  cholecalciferol 1000 UNITS tablet  Commonly known as:  VITAMIN D  Take 1,000 Units by mouth daily.     ciprofloxacin 500 MG tablet  Commonly known as:  CIPRO  Take 1 tablet (500 mg total) by mouth 2 (two) times daily.     diclofenac sodium 1 % Gel  Commonly known as:  VOLTAREN  Apply 2 g topically 3 (three) times daily as needed (pain).     DULoxetine 60 MG capsule  Commonly known as:  CYMBALTA  Take 60 mg by mouth daily.     dutasteride 0.5 MG capsule  Commonly known as:  AVODART  Take 0.5 mg by mouth daily.     ferrous sulfate 325 (65 FE) MG tablet  Take 325 mg by mouth daily with breakfast.     fluticasone 50 MCG/ACT nasal spray   Commonly known as:  FLONASE  Place 2 sprays into both nostrils daily as needed for allergies.     furosemide 20 MG tablet  Commonly known as:  LASIX  Take 20 mg by mouth daily as needed (swelling).     insulin detemir 100 UNIT/ML injection  Commonly known as:  LEVEMIR  Inject 30 Units into the skin every morning.     metFORMIN 500 MG tablet  Commonly known as:  GLUCOPHAGE  Take 2 tablets (1,000 mg total) by mouth 2 (two) times daily with a meal.     multivitamin with minerals Tabs tablet  Take 1 tablet by mouth every evening.     nitroGLYCERIN 0.4 MG SL tablet  Commonly known as:  NITROSTAT  Place 1 tablet (0.4 mg total) under the tongue every 5 (five) minutes as needed for chest pain.     pantoprazole 40 MG tablet  Commonly known as:  PROTONIX  Take 40 mg by mouth daily.     pregabalin 300 MG capsule  Commonly known as:  LYRICA  Take 1 capsule (300 mg total) by mouth 2 (two) times daily.     ramipril 2.5 MG capsule  Commonly known as:  ALTACE  Take 1.25 mg by mouth every evening.     repaglinide 2 MG tablet  Commonly known as:  PRANDIN  Take 2 mg by mouth 3 (three) times daily before meals.     rivaroxaban 20 MG Tabs tablet  Commonly known as:  XARELTO  Take 1 tablet (20 mg total) by mouth daily with supper.     rosuvastatin 10 MG tablet  Commonly known as:  CRESTOR  Take 10 mg by mouth every evening.     tapentadol 50 MG Tabs tablet  Commonly known as:  NUCYNTA  Take 50 mg by mouth 3 (three) times daily as needed (for pain).     valACYclovir 500 MG tablet  Commonly known as:  VALTREX  Take 500 mg by mouth daily.     VITAMIN C PO  Take 1 tablet by mouth daily.       Allergies  Allergen Reactions  . Phenergan [Promethazine] Other (See Comments)    Mood changes       The results of significant diagnostics from this hospitalization (including imaging, microbiology, ancillary and laboratory) are listed below for reference.    Significant Diagnostic  Studies: Dg Chest 2 View  07/24/2013   CLINICAL DATA:  Syncope  EXAM: CHEST  2 VIEW  COMPARISON:  March 20, 2013  FINDINGS: There is a questionable nipple shadow at the right base. There is mild left base atelectasis. Elsewhere lungs are clear.  Heart size and pulmonary vascularity are normal. No adenopathy. There is atherosclerotic change in aorta. There is postoperative change in the lower cervical spine. There is calcification in each carotid artery.  IMPRESSION: Question nipple shadow right base. Advise repeat study with nipple markers to confirm that this opacity is a nipple shadow.  Left base atelectasis.  No edema or consolidation.  There is carotid artery calcification bilaterally.   Electronically Signed   By: Lowella Grip M.D.   On: 07/24/2013 02:09   Mr Brain Wo Contrast  07/07/2013   [facility]  NEUROIMAGING REPORT   STUDY DATE: 07/04/13 PATIENT NAME: Steve Brown DOB: 09-21-1940 MRN: [id number]  ORDERING CLINICIAN: Larey Seat, MD  CLINICAL HISTORY: 73 year old male with confusion and left arm pain.  EXAM: MRI brain (without)  TECHNIQUE: MRI of the brain without contrast was obtained utilizing 5 mm  axial slices with T1, T2, T2 flair, SWI and diffusion weighted views.  T1  sagittal and T2 coronal views were obtained. CONTRAST: no IMAGING SITE: [facility] [address] (1.5 Tesla MRI)    FINDINGS:  No abnormal lesions are seen on diffusion-weighted views to suggest acute  ischemia. The cortical sulci, fissures and cisterns are normal in size and  appearance. Lateral, third and fourth ventricle are normal in size and  appearance. No extra-axial fluid collections are seen. No evidence of mass  effect or midline shift.  Few scattered periventricular and subcortical  and pontine and cerebellar chronic small vessel ischemic disease and small  lacunar infarcts.   On sagittal views the posterior fossa, pituitary gland and corpus callosum  are unremarkable. No  evidence of intracranial hemorrhage on SWI views. The  orbits and their contents, paranasal sinuses and calvarium are notable for  post-surgical orbits.  Intracranial flow voids are present.     07/07/2013   Abnormal MRI brain (without) demonstrating: 1. Few scattered periventricular and subcortical and pontine and  cerebellar chronic small vessel ischemic disease and small lacunar  infarcts.  2. No acute findings.  3. No change from CT on 05/20/13.   INTERPRETING PHYSICIAN:  Penni Bombard, MD Certified in Neurology, Neurophysiology and Neuroimaging  [facility] [address] [place], [place] [address] [phone]    Microbiology: No results found for this or any previous visit (from the past 240 hour(s)).   Labs: Basic Metabolic Panel:  Recent Labs Lab 07/24/13 0032 07/24/13 0600  NA 139 140  K 4.1 4.5  CL 100 101  CO2  --  29  GLUCOSE 193* 311*  BUN 17 15  CREATININE 1.10 1.03  CALCIUM  --  8.8   Liver Function Tests: No results found for this basename: AST, ALT, ALKPHOS, BILITOT, PROT, ALBUMIN,  in the last 168 hours No results found for this basename: LIPASE, AMYLASE,  in the last 168 hours No results found for this basename: AMMONIA,  in the last 168 hours CBC:  Recent Labs Lab 07/24/13 0026 07/24/13 0032 07/24/13 0600  WBC 5.7  --  6.0  NEUTROABS 2.4  --   --   HGB 12.4* 13.6 12.0*  HCT 36.9* 40.0 36.0*  MCV 89.6  --  89.6  PLT 211  --  193   Cardiac Enzymes: No results found for this basename: CKTOTAL, CKMB, CKMBINDEX, TROPONINI,  in the last 168 hours BNP: BNP (last 3 results)  Recent Labs  10/08/12 1523 03/20/13 1300  PROBNP 57.6 34.7   CBG:  Recent Labs  Lab 07/24/13 0313 07/24/13 0731 07/24/13 1145 07/24/13 1649  GLUCAP 264* 266* 356* 124*       Signed:  Lakeith Careaga S  [facility] 07/24/2013, 6:50 PM

## 2013-07-24 NOTE — Progress Notes (Signed)
Pt arrived to floor in NAD. Pt alert and oriented x4, VSS, pt steady on feet and with no complaints of dizziness. Pt educated to call RN if need to get OOB d/t syncopal episode. Pt verbalized understanding. Will continue to monitor. Ronnette Hila, RN

## 2013-07-24 NOTE — Progress Notes (Signed)
Subjective: Patient seen and examined, admitted with syncope. Cardiac enzymes are negative so far. Filed Vitals:   07/24/13 0735  BP:   Pulse:   Temp: 97.8 F (36.6 C)  Resp:     Chest: Clear Bilaterally Heart : S1S2 RRR Abdomen: Soft, nontender Ext : No edema Neuro: Alert, oriented x 3  A/P  Syncope:   syncope workup, Cardiac monitoring,  Cycle Troponins, Check Orthostatics, Neuro checks.  Will check echo and get cardiology consultation.  2. Diabetes mellitus type 2 with complications:  Hold Metformin and Prandin rx, Continue Lantus Insulin,  and Add SSI coverage PRN. Diabetic Diet.   3. HTN (hypertension):  Continue Ramipril Rx , And Lasix Rx, Monitor BPs.   4. CAD in native artery:  Stable   5. History of pulmonary embolism: June 2014,  Takes Xarelto:   6. Lumbago:  Pain Rx PRN. On Nucynta and Lyrica Rx at Home.   7. Unspecified hyperlipidemia:  Continue Statin Rx, on Crestor at home.   8. DVT Prophylaxis:  On Xarelto Rx.      [facility] [phone]

## 2013-07-24 NOTE — Telephone Encounter (Signed)
Not a problem, thank patient for the update. i hope is soon better again. CD

## 2013-07-28 NOTE — Progress Notes (Signed)
Quick Note:  Spoke to patient and relayed MRI brain results, per Dr. Brett Fairy. The patient is interested in OT. ______

## 2013-07-28 NOTE — Telephone Encounter (Signed)
I spoke to patient and relayed his MRI results.  He would like to receive OT for his pain in arm, as suggested in result note.  Please advise.

## 2013-07-29 ENCOUNTER — Ambulatory Visit: Payer: Medicare Other | Admitting: Physical Therapy

## 2013-07-29 DIAGNOSIS — IMO0001 Reserved for inherently not codable concepts without codable children: Secondary | ICD-10-CM | POA: Diagnosis not present

## 2013-08-01 ENCOUNTER — Telehealth: Payer: Self-pay | Admitting: Neurology

## 2013-08-01 DIAGNOSIS — R29898 Other symptoms and signs involving the musculoskeletal system: Secondary | ICD-10-CM

## 2013-08-01 NOTE — Telephone Encounter (Signed)
Spoke to patient and relayed ordered has been entered and he will be contacted by [facility] for appointment.

## 2013-08-01 NOTE — Telephone Encounter (Signed)
OT ordered. See MRI result note. CD

## 2013-08-04 ENCOUNTER — Ambulatory Visit: Payer: Medicare Other | Admitting: Physical Therapy

## 2013-08-06 ENCOUNTER — Ambulatory Visit (INDEPENDENT_AMBULATORY_CARE_PROVIDER_SITE_OTHER): Payer: Medicare Other | Admitting: Neurology

## 2013-08-06 DIAGNOSIS — R0902 Hypoxemia: Secondary | ICD-10-CM

## 2013-08-06 DIAGNOSIS — G4710 Hypersomnia, unspecified: Secondary | ICD-10-CM

## 2013-08-06 DIAGNOSIS — G4733 Obstructive sleep apnea (adult) (pediatric): Secondary | ICD-10-CM | POA: Diagnosis not present

## 2013-08-06 DIAGNOSIS — I639 Cerebral infarction, unspecified: Secondary | ICD-10-CM

## 2013-08-06 DIAGNOSIS — T50905A Adverse effect of unspecified drugs, medicaments and biological substances, initial encounter: Secondary | ICD-10-CM

## 2013-08-06 DIAGNOSIS — D689 Coagulation defect, unspecified: Secondary | ICD-10-CM

## 2013-08-06 DIAGNOSIS — G4730 Sleep apnea, unspecified: Secondary | ICD-10-CM

## 2013-08-06 DIAGNOSIS — R635 Abnormal weight gain: Secondary | ICD-10-CM

## 2013-08-06 DIAGNOSIS — R0683 Snoring: Secondary | ICD-10-CM

## 2013-08-07 ENCOUNTER — Other Ambulatory Visit: Payer: Self-pay | Admitting: Neurology

## 2013-08-08 DIAGNOSIS — L608 Other nail disorders: Secondary | ICD-10-CM | POA: Diagnosis not present

## 2013-08-08 DIAGNOSIS — I739 Peripheral vascular disease, unspecified: Secondary | ICD-10-CM | POA: Diagnosis not present

## 2013-08-08 DIAGNOSIS — E1059 Type 1 diabetes mellitus with other circulatory complications: Secondary | ICD-10-CM | POA: Diagnosis not present

## 2013-08-08 NOTE — Telephone Encounter (Signed)
Last OV note says: continued neurologic pain treatment on Lyrica and topical Voltaren Dr .Vira Blanco

## 2013-08-11 ENCOUNTER — Ambulatory Visit: Payer: Medicare Other | Attending: Neurology | Admitting: Occupational Therapy

## 2013-08-11 ENCOUNTER — Telehealth: Payer: Self-pay | Admitting: *Deleted

## 2013-08-11 DIAGNOSIS — IMO0001 Reserved for inherently not codable concepts without codable children: Secondary | ICD-10-CM | POA: Insufficient documentation

## 2013-08-11 DIAGNOSIS — R269 Unspecified abnormalities of gait and mobility: Secondary | ICD-10-CM | POA: Diagnosis not present

## 2013-08-11 DIAGNOSIS — R5381 Other malaise: Secondary | ICD-10-CM | POA: Insufficient documentation

## 2013-08-11 DIAGNOSIS — I69998 Other sequelae following unspecified cerebrovascular disease: Secondary | ICD-10-CM | POA: Insufficient documentation

## 2013-08-11 NOTE — Telephone Encounter (Signed)
Patient is on the wait list, calling to see if the patient was still interested in a sooner appointment, left message to return the call to schedule an appointment with NP MM or Dr. Brett Fairy.

## 2013-08-11 NOTE — Telephone Encounter (Signed)
Patient returning my call, he has an appointment with Dr. Brett Fairy on 08/14/13, appointment is kept, patient's name has been removed from wait list.

## 2013-08-12 ENCOUNTER — Ambulatory Visit: Payer: Medicare Other | Admitting: Oncology

## 2013-08-12 ENCOUNTER — Other Ambulatory Visit: Payer: Medicare Other

## 2013-08-13 ENCOUNTER — Other Ambulatory Visit: Payer: Self-pay | Admitting: Neurology

## 2013-08-13 ENCOUNTER — Ambulatory Visit: Payer: Medicare Other | Admitting: Physical Therapy

## 2013-08-13 DIAGNOSIS — IMO0001 Reserved for inherently not codable concepts without codable children: Secondary | ICD-10-CM | POA: Diagnosis not present

## 2013-08-13 NOTE — Telephone Encounter (Signed)
Per last QA:[id number] neurologic pain treatment on Lyrica and topical Voltaren Dr .Vira Blanco

## 2013-08-14 ENCOUNTER — Ambulatory Visit (INDEPENDENT_AMBULATORY_CARE_PROVIDER_SITE_OTHER): Payer: Medicare Other | Admitting: Neurology

## 2013-08-14 ENCOUNTER — Encounter: Payer: Self-pay | Admitting: Neurology

## 2013-08-14 ENCOUNTER — Ambulatory Visit: Payer: Medicare Other | Admitting: Occupational Therapy

## 2013-08-14 VITALS — BP 127/75 | HR 68 | Resp 16 | Ht 65.0 in | Wt 166.0 lb

## 2013-08-14 DIAGNOSIS — G9389 Other specified disorders of brain: Secondary | ICD-10-CM

## 2013-08-14 DIAGNOSIS — IMO0001 Reserved for inherently not codable concepts without codable children: Secondary | ICD-10-CM | POA: Diagnosis not present

## 2013-08-14 DIAGNOSIS — G4733 Obstructive sleep apnea (adult) (pediatric): Secondary | ICD-10-CM | POA: Diagnosis not present

## 2013-08-14 DIAGNOSIS — I10 Essential (primary) hypertension: Secondary | ICD-10-CM | POA: Diagnosis not present

## 2013-08-14 DIAGNOSIS — I2510 Atherosclerotic heart disease of native coronary artery without angina pectoris: Secondary | ICD-10-CM

## 2013-08-14 DIAGNOSIS — R9082 White matter disease, unspecified: Secondary | ICD-10-CM | POA: Insufficient documentation

## 2013-08-14 NOTE — Progress Notes (Signed)
[facility]  Provider:  Dr Brett Fairy Referring Provider: Gus Height, MD Primary Care Physician:   Melinda Crutch, MD  Chief Complaint  Patient presents with  . Follow-up    Room 11  . Cerebrovascular Accident     Interval history :  Mr. Bones is meanwhile 73 years old and has been staying active. He has controlled his weight he is physically active,  he also has undergone a complete course of physical therapy since  last visit. After out  last visit,  we had obtained an MRI of the brain without contrast and compared to the study from earlier the same year : there was no  change in comparison to the May CT on this MRI dated 07-04-13 . The patient has few scattered small vessel injuries and some lacunar infarcts.  The main risk factor for these as hypertension and diabetes-  Occupational therapy saw him for left arm pain. Additional risk factor is the patient's diabetes mellitus condition. I referred him for occupational therapy to get further insight in what may have caused his arm pain.  He noted that he could left pounds with the left 3 pounds with her right hand now there is still a significant  difference .  resulting in the conduction study and EMG study which was then performed on 07-09-13, normal study no evidence of large fiber neuropathy in the left upper extremity deltoid, biceps, triceps and flexor carpi radialis as well as interosseus  Muscles; no  abnormal EMG activity. He underwent a sleep study at [facility] : results from 08-07-13 reviewed today : Mr. Carneal was diagnosed with severe sleep apnea at an AHI of 65.9. Lowest point of oxygen saturation was 70%. He was titrated to CPAP at 9 cm water he slept 92.7 minutes at that pressure of which 23 minutes of REM sleep. The AHI was now 0.6 per hour. The previously very fragmented sleep architecture became now essentially normal. The technologist used a PIC all equal nasal mask. The patient is asked to return in about 50  days for a followup with the CPAP machine.  I also reviewed his long medication list : patient has been on Xeralto for chronic anticoagulation.       ROS :  SOB, angina pectoris, nocturia. Apnea , snoring, fatigue.    Last visit notes:  HPI:  Khadar Monger is a 73 y.o. male here as a referral from Dr. Harrington Challenger for cerebrovascular disease. Mr. Leatham is a  73 year old right-handed African American gentleman, seen here today for a routine revisit. He had 2 strokes  , one in each hemisphere , embolic strokes.  He is followed by a Dr.Spivey  since Summer 2011 for  pain management , especially of back pain. But he has experienced other sources of discomfort besides his established back pain diagnosis.  He had to reported that coughing or sneezing  not affect his back pain and he denies any numbness or change in bowel or bladder continence. One of his main complaints was a sharp pain arising from the middle to lower back worse on the right side that radiates into the upper right buttock and sometimes down the anterior leg.  He has been seeing Dr. Geoffery Lyons . [name] had ruled out cardiology conditions.He has continued to use anticoagulation and is now on a Coumadin replacement Xeralto.   He patient was originally referred in 2011 for sleep apnea the patient has reported having a tendency to have nightmares but every night but  frequently visit dreams and felt that once he used CPAP also become more frequent. They may offer all reflexes ability to enter REM sleep stages via apnea is treated I have and 36-1/2-year-old daughter download from the CPAP machine to review here from generally to thousand 11 at that time the patient was on 11 cm water and had a residual AHI of 1.1 but his compliance was spotty. Today's Epworth sleepiness score is 9 points the fatigue severity score is 40 pints the geriatric depression score is 5 points and his fall risk assessment is 9 points.  He did discontinue the CPAP use- i  will not longer follow him  at the  sleep clinic. His pain management needs to be centered with pain clinic.  He continued to go to the [facility] 4 days per week.  He is active and his weight is stable.   The patient requests a refill of a topical anesthetic in topical form as well as Lyrica  refills.      History   Social History  . Marital Status: Divorced    Spouse Name: N/A    Number of Children: 51  . Years of Education: 12   Occupational History  .      retired   Social History Main Topics  . Smoking status: Never Smoker   . Smokeless tobacco: Never Used  . Alcohol Use: No  . Drug Use: No  . Sexual Activity: Not on file   Other Topics Concern  . Not on file   Social History Narrative   Patient lives at home alone and he is single.  Patient is retired.    Caffeine - one cups daily.   Right handed.   Patient has a high school education.   Patient has four adult children.    Family History  Problem Relation Age of Onset  . Aneurysm Mother   . Cancer Father     Past Medical History  Diagnosis Date  . Diabetes mellitus   . Coronary artery disease   . Hypertension   . History of TIAs   . Lumbago   . Benign localized hyperplasia of prostate without urinary obstruction and other lower urinary tract symptoms (LUTS)   . Other and unspecified hyperlipidemia   . Unspecified essential hypertension   . Type II or unspecified type diabetes mellitus without mention of complication, not stated as uncontrolled   . Personal history of unspecified circulatory disease   . Unspecified fall   . Hypersomnia with sleep apnea, unspecified   . Pain in joint, multiple sites   . Degeneration of cervical intervertebral disc   . Unspecified cardiovascular disease   . Unsteady gait 05/19/2013    X 24 hours - improving. Hx prior CVA/TIAs; multiple risk factors; on Xarelto  . Stroke 2002, 2003    both sided weakness    Past Surgical History  Procedure Laterality Date  . Hernia  repair    . Carpal tunnel release      bilateral  . Elbow surgery      right elbow - nerve release  . Cervical disc surgery    . Lungs      "fluid pumped off lungs"  . Coronary stent placement  Feb. 2010  . Cardiac catheterization    . Coronary angioplasty      Current Outpatient Prescriptions  Medication Sig Dispense Refill  . Ascorbic Acid (VITAMIN C PO) Take 1 tablet by mouth daily.      . chlorhexidine (  PERIDEX) 0.12 % solution       . cholecalciferol (VITAMIN D) 1000 UNITS tablet Take 1,000 Units by mouth daily.      . ciprofloxacin (CIPRO) 500 MG tablet Take 1 tablet (500 mg total) by mouth 2 (two) times daily.  14 tablet  0  . diclofenac sodium (VOLTAREN) 1 % GEL Apply 2 g topically 3 (three) times daily as needed (pain).      . DULoxetine (CYMBALTA) 60 MG capsule Take 60 mg by mouth daily.      Marland Kitchen dutasteride (AVODART) 0.5 MG capsule Take 0.5 mg by mouth daily.      . ferrous sulfate 325 (65 FE) MG tablet Take 325 mg by mouth daily with breakfast.      . fluticasone (FLONASE) 50 MCG/ACT nasal spray Place 2 sprays into both nostrils daily as needed for allergies.       . furosemide (LASIX) 20 MG tablet Take 20 mg by mouth daily as needed (swelling).       . insulin detemir (LEVEMIR) 100 UNIT/ML injection Inject 30 Units into the skin every morning.       . metFORMIN (GLUCOPHAGE) 500 MG tablet Take 2 tablets (1,000 mg total) by mouth 2 (two) times daily with a meal.      . Multiple Vitamin (MULTIVITAMIN WITH MINERALS) TABS Take 1 tablet by mouth every evening.       . nitroGLYCERIN (NITROSTAT) 0.4 MG SL tablet Place 1 tablet (0.4 mg total) under the tongue every 5 (five) minutes as needed for chest pain.  25 tablet  12  . pantoprazole (PROTONIX) 40 MG tablet Take 40 mg by mouth daily.      . pregabalin (LYRICA) 300 MG capsule Take 1 capsule (300 mg total) by mouth 2 (two) times daily.  180 capsule  3  . ramipril (ALTACE) 2.5 MG capsule Take 1.25 mg by mouth every evening.       .  repaglinide (PRANDIN) 2 MG tablet Take 2 mg by mouth 3 (three) times daily before meals.      . Rivaroxaban (XARELTO) 20 MG TABS tablet Take 1 tablet (20 mg total) by mouth daily with supper.  30 tablet    . rosuvastatin (CRESTOR) 10 MG tablet Take 10 mg by mouth every evening.       . tapentadol (NUCYNTA) 50 MG TABS Take 50 mg by mouth 3 (three) times daily as needed (for pain).      . valACYclovir (VALTREX) 500 MG tablet Take 500 mg by mouth daily.       No current facility-administered medications for this visit.    Allergies as of 08/14/2013 - Review Complete 08/14/2013  Allergen Reaction Noted  . Phenergan [promethazine] Other (See Comments) 06/18/2013    Vitals: BP 127/75  Pulse 68  Resp 16  Ht 5\' 5"  (1.651 m)  Wt 166 lb (75.297 kg)  BMI 27.62 kg/m2 Last Weight:  Wt Readings from Last 1 Encounters:  08/14/13 166 lb (75.297 kg)   Last Height:   Ht Readings from Last 1 Encounters:  08/14/13 5\' 5"  (1.651 m)     Physical exam:  General: The patient is awake, alert and appears not in acute distress. The patient is well groomed. Head: Normocephalic, atraumatic. Neck is supple. Mallampati 3 neck circumference: 14.55 inches  , retrognathia.  Nasal septum is midline, nasal airflow unhindered.  Cardiovascular:  Regular rate - rare palpiations, without  murmurs or carotid bruit, and without distended neck veins. Respiratory: Lungs  are clear to auscultation. Skin:  Without evidence of edema, or rash Trunk: BMI is elevated - normal posture.  Neurologic exam : The patient is awake and alert, oriented to place and time.   Memory subjective  described as intact. There is a normal attention span & concentration ability.  Speech is fluent without dysarthria, dysphonia or aphasia. Mood and affect : concerned, worried.  Cranial nerves: Pupils are equal and briskly reactive to light. Funduscopic exam without evidence of pallor or edema.  Extraocular movements  in vertical and  horizontal planes intact and without nystagmus. Visual fields by finger perimetry are intact. Hearing to finger rub intact.  Facial sensation intact to fine touch. Facial motor strength is symmetric and tongue and uvula move midline.  Motor exam:  The patient reports pain radiating down the left upper extremity and his left hand is less sensitive to pinprick and fine touch . This started after the stroke on the right MCA.  He also has reported a patch over the left shoulder that is numb. He is able to perform rapid movements with both upper extremities and hands finger-to-nose .  His gait is changed, -  He walks extremely careful, and tried to lean on the computer cart with his left arm- which almost let to a fall. He could only with difficulties ascend to the exam table, .  His DT reflexes were symmetric. He has not reported any recent falls.   His left Babinski response is up-going, but he a has strong plantar and dorsiflexion at the ankle. No foot drop.    Sensory:  Fine touch, pinprick and vibration were tested in all extremities. Proprioception normal. He describes shock sensations down the left leg, preserved brisk reflexes, his left arm feels heavy, he has trouble to place the left leg fisrt on a stair, when walking he drifts to the left. Stumbling on occasion.   Coordination: Rapid alternating movements in the fingers/hands is tested and normal. Finger-to-nose maneuver tested and normal without evidence of ataxia, dysmetria or tremor.  Deep tendon reflexes: in the  upper and lower extremities are symmetric and intact. Babinski maneuver response is left upgoing .  Assessment:  After physical and neurologic examination, review of laboratory studies, imaging, neurophysiology testing and pre-existing records, assessment ;  SLEEP STUDY REVIEWED AND DISCUSSED :   MRI brain reviewed .    small vessel disease,  hand pain not explained by NCS , EMG or brain neck MRI.  Mr. Goodwyn was diagnosed  with severe sleep apnea at an AHI of 65.9. Lowest point of oxygen saturation was 70%. He was titrated to CPAP at 9 cm water he slept 92.7 minutes at that pressure of which 23 minutes of REM sleep. The AHI was now 0.6 per hour. The previously very fragmented sleep architecture became now essentially normal. The technologist used a PIC all equal nasal mask. The patient is asked to return in about 50 days for a followup with the CPAP machine.   Plan:  Treatment plan and additional workup ; CPAP at 9 cm water with PICO nasal mask. Patiet is willing to start therapy, which may help his daytime fatigue and alertness.  Continued stroke prevention on XERALTO ,Continued neurologic pain treatment on Lyrica and topical Voltaren with  Dr .Vira Blanco . Patient remains with orthopedist and pain management .

## 2013-08-14 NOTE — Patient Instructions (Signed)

## 2013-08-16 ENCOUNTER — Telehealth: Payer: Self-pay | Admitting: Neurology

## 2013-08-16 ENCOUNTER — Encounter: Payer: Self-pay | Admitting: Neurology

## 2013-08-16 NOTE — Telephone Encounter (Signed)
Pt was provided sleep study results by Dr. Brett Fairy during 08/14/2013 office visit.  He received a copy of his sleep study report and a copy of his report was sent to Dr. Melinda Crutch.  Pt is aware that he will be contacted by [facility] to begin CPAP therapy.

## 2013-08-18 ENCOUNTER — Ambulatory Visit: Payer: Medicare Other | Admitting: Occupational Therapy

## 2013-08-18 DIAGNOSIS — IMO0001 Reserved for inherently not codable concepts without codable children: Secondary | ICD-10-CM | POA: Diagnosis not present

## 2013-08-20 ENCOUNTER — Encounter: Payer: Medicare Other | Admitting: Occupational Therapy

## 2013-08-25 ENCOUNTER — Ambulatory Visit: Payer: Medicare Other | Admitting: Occupational Therapy

## 2013-08-25 DIAGNOSIS — IMO0001 Reserved for inherently not codable concepts without codable children: Secondary | ICD-10-CM | POA: Diagnosis not present

## 2013-08-26 ENCOUNTER — Ambulatory Visit: Payer: Medicare Other | Admitting: Occupational Therapy

## 2013-08-26 DIAGNOSIS — IMO0001 Reserved for inherently not codable concepts without codable children: Secondary | ICD-10-CM | POA: Diagnosis not present

## 2013-08-28 ENCOUNTER — Encounter: Payer: Self-pay | Admitting: Neurology

## 2013-09-01 ENCOUNTER — Ambulatory Visit: Payer: Medicare Other | Admitting: Occupational Therapy

## 2013-09-01 DIAGNOSIS — IMO0001 Reserved for inherently not codable concepts without codable children: Secondary | ICD-10-CM | POA: Diagnosis not present

## 2013-09-03 DIAGNOSIS — G609 Hereditary and idiopathic neuropathy, unspecified: Secondary | ICD-10-CM | POA: Diagnosis not present

## 2013-09-03 DIAGNOSIS — E7800 Pure hypercholesterolemia, unspecified: Secondary | ICD-10-CM | POA: Diagnosis not present

## 2013-09-03 DIAGNOSIS — IMO0001 Reserved for inherently not codable concepts without codable children: Secondary | ICD-10-CM | POA: Diagnosis not present

## 2013-09-03 DIAGNOSIS — I10 Essential (primary) hypertension: Secondary | ICD-10-CM | POA: Diagnosis not present

## 2013-09-04 ENCOUNTER — Ambulatory Visit: Payer: Medicare Other | Admitting: Occupational Therapy

## 2013-09-04 DIAGNOSIS — IMO0001 Reserved for inherently not codable concepts without codable children: Secondary | ICD-10-CM | POA: Diagnosis not present

## 2013-09-05 DIAGNOSIS — H20019 Primary iridocyclitis, unspecified eye: Secondary | ICD-10-CM | POA: Diagnosis not present

## 2013-09-05 DIAGNOSIS — H35039 Hypertensive retinopathy, unspecified eye: Secondary | ICD-10-CM | POA: Diagnosis not present

## 2013-09-08 ENCOUNTER — Ambulatory Visit: Payer: Medicare Other | Admitting: Occupational Therapy

## 2013-09-08 DIAGNOSIS — IMO0001 Reserved for inherently not codable concepts without codable children: Secondary | ICD-10-CM | POA: Diagnosis not present

## 2013-09-10 ENCOUNTER — Ambulatory Visit: Payer: Medicare Other | Attending: Neurology | Admitting: Occupational Therapy

## 2013-09-10 DIAGNOSIS — I69998 Other sequelae following unspecified cerebrovascular disease: Secondary | ICD-10-CM | POA: Diagnosis not present

## 2013-09-10 DIAGNOSIS — R269 Unspecified abnormalities of gait and mobility: Secondary | ICD-10-CM | POA: Diagnosis not present

## 2013-09-10 DIAGNOSIS — IMO0001 Reserved for inherently not codable concepts without codable children: Secondary | ICD-10-CM | POA: Insufficient documentation

## 2013-09-10 DIAGNOSIS — R5381 Other malaise: Secondary | ICD-10-CM | POA: Diagnosis not present

## 2013-09-12 DIAGNOSIS — H35039 Hypertensive retinopathy, unspecified eye: Secondary | ICD-10-CM | POA: Diagnosis not present

## 2013-09-12 DIAGNOSIS — H20019 Primary iridocyclitis, unspecified eye: Secondary | ICD-10-CM | POA: Diagnosis not present

## 2013-09-17 ENCOUNTER — Encounter: Payer: Medicare Other | Admitting: Occupational Therapy

## 2013-09-24 DIAGNOSIS — E161 Other hypoglycemia: Secondary | ICD-10-CM | POA: Diagnosis not present

## 2013-09-24 DIAGNOSIS — R7301 Impaired fasting glucose: Secondary | ICD-10-CM | POA: Diagnosis not present

## 2013-09-25 ENCOUNTER — Ambulatory Visit (INDEPENDENT_AMBULATORY_CARE_PROVIDER_SITE_OTHER): Payer: Medicare Other | Admitting: Adult Health

## 2013-09-25 ENCOUNTER — Encounter: Payer: Self-pay | Admitting: Neurology

## 2013-09-25 ENCOUNTER — Encounter: Payer: Self-pay | Admitting: Adult Health

## 2013-09-25 ENCOUNTER — Encounter (INDEPENDENT_AMBULATORY_CARE_PROVIDER_SITE_OTHER): Payer: Self-pay

## 2013-09-25 VITALS — BP 126/70 | HR 61 | Ht 65.0 in | Wt 172.0 lb

## 2013-09-25 DIAGNOSIS — Z8673 Personal history of transient ischemic attack (TIA), and cerebral infarction without residual deficits: Secondary | ICD-10-CM | POA: Diagnosis not present

## 2013-09-25 DIAGNOSIS — I2510 Atherosclerotic heart disease of native coronary artery without angina pectoris: Secondary | ICD-10-CM

## 2013-09-25 DIAGNOSIS — G4733 Obstructive sleep apnea (adult) (pediatric): Secondary | ICD-10-CM

## 2013-09-25 DIAGNOSIS — Z9989 Dependence on other enabling machines and devices: Principal | ICD-10-CM

## 2013-09-25 NOTE — Progress Notes (Signed)
I agree with the assessment and plan as directed by NP .The patient is known to me .   Lateia Fraser, MD  

## 2013-09-25 NOTE — Progress Notes (Signed)
PATIENT: Steve Brown DOB: December 15, 1940  REASON FOR VISIT: follow up HISTORY FROM: patient  HISTORY OF PRESENT ILLNESS: Steve Brown is a 73 year old male with history of obstructive sleep Apnea. He returns today for a 30 day compliance download. He did not bring his machine with him. [facility] to get a report. He reports that he uses the machine nightly for at least 6 hours most nights.  His Epworth score is 8 points was previously 9 points. His fatigue severity score is 32 was previously 40. Patient reports that he gets about 8 hours of sleep a night. He goes to bed between 10 and 11 pm  arises between 6:30-7:30 am.  He denies having trouble falling asleep or staying a sleep. States that the gets up about 2-3 times a night to urinate. Overall patient doesn't feel much different using the CPAP. He has only being using it one month. He still has some left shoulder pain but he continues to exercise with light 2-3lb weights. [facility]. He was getting steroid injections in the back which were helpful but it did cause him to gain some weight. He has also seen a neurosurgeon who recommended surgery. He is still contemplating this.   HISTORY 08/14/13 (CD): Steve Brown is meanwhile 73 years old and has been staying active. He has controlled his weight he is physically active, he also has undergone a complete course of physical therapy since last visit. After out last visit, we had obtained an MRI of the brain without contrast and compared to the study from earlier the same year : there was no change in comparison to the May CT on this MRI dated 07-04-13 .  The patient has few scattered small vessel injuries and some lacunar infarcts.  The main risk factor for these as hypertension and diabetes- Occupational therapy saw him for left arm pain. Additional risk factor is the patient's diabetes mellitus condition. I referred him for  occupational therapy to get further insight in what may have caused his arm pain.  He noted that he could left pounds with the left 3 pounds with her right hand now there is still a significant difference .  resulting in the conduction study and EMG study which was then performed on 07-09-13, normal study no evidence of large fiber neuropathy in the left upper extremity deltoid, biceps, triceps and flexor carpi radialis as well as interosseus Muscles; no abnormal EMG activity.  He underwent a sleep study at [facility] : results from 08-07-13 reviewed today :  Steve Brown was diagnosed with severe sleep apnea at an AHI of 65.9. Lowest point of oxygen saturation was 70%. He was titrated to CPAP at 9 cm water he slept 92.7 minutes at that pressure of which 23 minutes of REM sleep. The AHI was now 0.6 per hour. The previously very fragmented sleep architecture became now essentially normal. The technologist used a PIC all equal nasal mask. The patient is asked to return in about 50 days for a followup with the CPAP machine.  I also reviewed his long medication list : patient has been on Xeralto for chronic anticoagulation   REVIEW OF SYSTEMS: Full 14 system review of systems performed and notable only for:  Constitutional: N/A  Eyes: Eye redness Ear/Nose/Throat: N/A  Skin: N/A  Cardiovascular: N/A  Respiratory: N/A  Gastrointestinal: Swollen abdomen  Genitourinary: Frequency of urination,  urgency Hematology/Lymphatic: N/A  Endocrine: N/A Musculoskeletal: Neck pain, neck stiffness  Allergy/Immunology: N/A  Neurological: Numbness, weakness Psychiatric: N/A Sleep: N/A   ALLERGIES: Allergies  Allergen Reactions  . Phenergan [Promethazine] Other (See Comments)    Mood changes     HOME MEDICATIONS: Outpatient Prescriptions Prior to Visit  Medication Sig Dispense Refill  . Ascorbic Acid (VITAMIN C PO) Take 1 tablet by mouth daily.      . chlorhexidine (PERIDEX) 0.12 % solution       .  cholecalciferol (VITAMIN D) 1000 UNITS tablet Take 1,000 Units by mouth daily.      . ciprofloxacin (CIPRO) 500 MG tablet Take 1 tablet (500 mg total) by mouth 2 (two) times daily.  14 tablet  0  . diclofenac sodium (VOLTAREN) 1 % GEL Apply 2 g topically 3 (three) times daily as needed (pain).      . DULoxetine (CYMBALTA) 60 MG capsule Take 60 mg by mouth daily.      Marland Kitchen dutasteride (AVODART) 0.5 MG capsule Take 0.5 mg by mouth daily.      . ferrous sulfate 325 (65 FE) MG tablet Take 325 mg by mouth daily with breakfast.      . fluticasone (FLONASE) 50 MCG/ACT nasal spray Place 2 sprays into both nostrils daily as needed for allergies.       . furosemide (LASIX) 20 MG tablet Take 20 mg by mouth daily as needed (swelling).       . insulin detemir (LEVEMIR) 100 UNIT/ML injection Inject 30 Units into the skin every morning.       . metFORMIN (GLUCOPHAGE) 500 MG tablet Take 2 tablets (1,000 mg total) by mouth 2 (two) times daily with a meal.      . Multiple Vitamin (MULTIVITAMIN WITH MINERALS) TABS Take 1 tablet by mouth every evening.       . nitroGLYCERIN (NITROSTAT) 0.4 MG SL tablet Place 1 tablet (0.4 mg total) under the tongue every 5 (five) minutes as needed for chest pain.  25 tablet  12  . pantoprazole (PROTONIX) 40 MG tablet Take 40 mg by mouth daily.      . pregabalin (LYRICA) 300 MG capsule Take 1 capsule (300 mg total) by mouth 2 (two) times daily.  180 capsule  3  . ramipril (ALTACE) 2.5 MG capsule Take 1.25 mg by mouth every evening.       . repaglinide (PRANDIN) 2 MG tablet Take 2 mg by mouth 3 (three) times daily before meals.      . Rivaroxaban (XARELTO) 20 MG TABS tablet Take 1 tablet (20 mg total) by mouth daily with supper.  30 tablet    . rosuvastatin (CRESTOR) 10 MG tablet Take 10 mg by mouth every evening.       . tapentadol (NUCYNTA) 50 MG TABS Take 50 mg by mouth 3 (three) times daily as needed (for pain).      . valACYclovir (VALTREX) 500 MG tablet Take 500 mg by mouth daily.        No facility-administered medications prior to visit.    PAST MEDICAL HISTORY: Past Medical History  Diagnosis Date  . Diabetes mellitus   . Coronary artery disease   . Hypertension   . History of TIAs   . Lumbago   . Benign localized hyperplasia of prostate without urinary obstruction and other lower urinary tract symptoms (LUTS)   . Other and unspecified hyperlipidemia   . Unspecified essential hypertension   . Type II or unspecified type  diabetes mellitus without mention of complication, not stated as uncontrolled   . Personal history of unspecified circulatory disease   . Unspecified fall   . Hypersomnia with sleep apnea, unspecified   . Pain in joint, multiple sites   . Degeneration of cervical intervertebral disc   . Unspecified cardiovascular disease   . Unsteady gait 05/19/2013    X 24 hours - improving. Hx prior CVA/TIAs; multiple risk factors; on Xarelto  . Stroke 2002, 2003    both sided weakness    PAST SURGICAL HISTORY: Past Surgical History  Procedure Laterality Date  . Hernia repair    . Carpal tunnel release      bilateral  . Elbow surgery      right elbow - nerve release  . Cervical disc surgery    . Lungs      "fluid pumped off lungs"  . Coronary stent placement  Feb. 2010  . Cardiac catheterization    . Coronary angioplasty      FAMILY HISTORY: Family History  Problem Relation Age of Onset  . Aneurysm Mother   . Cancer Father     SOCIAL HISTORY: History   Social History  . Marital Status: Divorced    Spouse Name: N/A    Number of Children: 35  . Years of Education: 12   Occupational History  .      retired   Social History Main Topics  . Smoking status: Never Smoker   . Smokeless tobacco: Never Used  . Alcohol Use: No  . Drug Use: No  . Sexual Activity: Not on file   Other Topics Concern  . Not on file   Social History Narrative   Patient lives at home alone and he is single.  Patient is retired.    Caffeine - one cups  daily.   Right handed.   Patient has a high school education.   Patient has four adult children.      PHYSICAL EXAM  Filed Vitals:   09/25/13 1420  BP: 97/53  Pulse: 73  Height: 5\' 5"  (1.651 m)  Weight: 172 lb (78.019 kg)   Body mass index is 28.62 kg/(m^2).  Generalized: Well developed, in no acute distress  NECK: Circumference 16 inches, Mallampati 4+  Neurological examination  Mentation: Alert oriented to time, place, history taking. Follows all commands speech and language fluent Cranial nerve II-XII: Pupils were equal round reactive to light. Extraocular movements were full, visual field were full on confrontational test. Facial sensation and strength were normal.Uvula tongue midline. Head turning and shoulder shrug  were normal and symmetric. Motor: The motor testing reveals 5 over 5 strength of all 4 extremities. Good symmetric motor tone is noted throughout.  Sensory: Sensory testing is intact to soft touch on all 4 extremities. No evidence of extinction is noted.  Coordination: Cerebellar testing reveals good finger-nose-finger and heel-to-shin bilaterally.  Gait and station: Gait is normal.   Reflexes: Deep tendon reflexes are symmetric and normal bilaterally.     DIAGNOSTIC DATA (LABS, IMAGING, TESTING) - I reviewed patient records, labs, notes, testing and imaging myself where available.  Lab Results  Component Value Date   WBC 6.0 07/24/2013   HGB 12.0* 07/24/2013   HCT 36.0* 07/24/2013   MCV 89.6 07/24/2013   PLT 193 07/24/2013      Component Value Date/Time   NA 140 07/24/2013 0600   NA 140 08/14/2012 1513   K 4.5 07/24/2013 0600   K 4.3 08/14/2012 1513  CL 101 07/24/2013 0600   CO2 29 07/24/2013 0600   CO2 27 08/14/2012 1513   GLUCOSE 311* 07/24/2013 0600   GLUCOSE 262* 08/14/2012 1513   BUN 15 07/24/2013 0600   BUN 19.0 08/14/2012 1513   CREATININE 1.03 07/24/2013 0600   CREATININE 1.10 05/19/2013 1108   CREATININE 1.3 08/14/2012 1513   CALCIUM 8.8 07/24/2013 0600     CALCIUM 9.3 08/14/2012 1513   PROT 6.2 03/20/2013 1300   PROT 6.9 08/14/2012 1513   ALBUMIN 3.3* 03/20/2013 1300   ALBUMIN 3.4* 08/14/2012 1513   AST 19 03/20/2013 1300   AST 15 08/14/2012 1513   ALT 19 03/20/2013 1300   ALT 14 08/14/2012 1513   ALKPHOS 70 03/20/2013 1300   ALKPHOS 92 08/14/2012 1513   BILITOT 0.4 03/20/2013 1300   BILITOT 0.39 08/14/2012 1513   GFRNONAA 71* 07/24/2013 0600   GFRNONAA 67 05/19/2013 1108   GFRAA 82* 07/24/2013 0600   GFRAA 78 05/19/2013 1108   Lab Results  Component Value Date   CHOL 144 03/21/2013   HDL 57 03/21/2013   LDLCALC 63 03/21/2013   TRIG 122 03/21/2013   CHOLHDL 2.5 03/21/2013   Lab Results  Component Value Date   HGBA1C 12.2* 03/20/2013   No results found for this basename: VITAMINB12   Lab Results  Component Value Date   TSH 0.618 03/20/2013      ASSESSMENT AND PLAN 74 y.o. year old male  has a past medical history of Diabetes mellitus; Coronary artery disease; Hypertension; History of TIAs; Lumbago; Benign localized hyperplasia of prostate without urinary obstruction and other lower urinary tract symptoms (LUTS); Other and unspecified hyperlipidemia; Unspecified essential hypertension; Type II or unspecified type diabetes mellitus without mention of complication, not stated as uncontrolled; Personal history of unspecified circulatory disease; Unspecified fall; Hypersomnia with sleep apnea, unspecified; Pain in joint, multiple sites; Degeneration of cervical intervertebral disc; Unspecified cardiovascular disease; Unsteady gait (05/19/2013); and Stroke (2002, 2003). here with:  1. OSA on CPAP  [facility] and have asked that they send [place] a report. It had not come by the end of our visit today. I will call the patient with the results. Once we have the results I will be able to determine if the CPAP has improved his apnea index. His fatigue score has improve but the Epworth sleepiness score has remained about the same.  Otherwise  patient should follow-up in 3-4 months. He should continue exercising daily.    Ward Givens, MSN, NP-C 09/25/2013, 2:50 PM [facility] [address], [place], [place] [address] [phone]  Note: This document was prepared with digital dictation and possible smart phrase technology. Any transcriptional errors that result from this process are unintentional.

## 2013-09-25 NOTE — Patient Instructions (Signed)
Sleep Apnea  Sleep apnea is a sleep disorder characterized by abnormal pauses in breathing while you sleep. When your breathing pauses, the level of oxygen in your blood decreases. This causes you to move out of deep sleep and into light sleep. As a result, your quality of sleep is poor, and the system that carries your blood throughout your body (cardiovascular system) experiences stress. If sleep apnea remains untreated, the following conditions can develop:  High blood pressure (hypertension).  Coronary artery disease.  Inability to achieve or maintain an erection (impotence).  Impairment of your thought process (cognitive dysfunction). There are three types of sleep apnea: 1. Obstructive sleep apnea--Pauses in breathing during sleep because of a blocked airway. 2. Central sleep apnea--Pauses in breathing during sleep because the area of the brain that controls your breathing does not send the correct signals to the muscles that control breathing. 3. Mixed sleep apnea--A combination of both obstructive and central sleep apnea. RISK FACTORS The following risk factors can increase your risk of developing sleep apnea:  Being overweight.  Smoking.  Having narrow passages in your nose and throat.  Being of older age.  Being male.  Alcohol use.  Sedative and tranquilizer use.  Ethnicity. Among individuals younger than 35 years, African Americans are at increased risk of sleep apnea. SYMPTOMS   Difficulty staying asleep.  Daytime sleepiness and fatigue.  Loss of energy.  Irritability.  Loud, heavy snoring.  Morning headaches.  Trouble concentrating.  Forgetfulness.  Decreased interest in sex. DIAGNOSIS  In order to diagnose sleep apnea, your caregiver will perform a physical examination. Your caregiver may suggest that you take a home sleep test. Your caregiver may also recommend that you spend the night in a sleep lab. In the sleep lab, several monitors record  information about your heart, lungs, and brain while you sleep. Your leg and arm movements and blood oxygen level are also recorded. TREATMENT The following actions may help to resolve mild sleep apnea:  Sleeping on your side.   Using a decongestant if you have nasal congestion.   Avoiding the use of depressants, including alcohol, sedatives, and narcotics.   Losing weight and modifying your diet if you are overweight. There also are devices and treatments to help open your airway:  Oral appliances. These are custom-made mouthpieces that shift your lower jaw forward and slightly open your bite. This opens your airway.  Devices that create positive airway pressure. This positive pressure "splints" your airway open to help you breathe better during sleep. The following devices create positive airway pressure:  Continuous positive airway pressure (CPAP) device. The CPAP device creates a continuous level of air pressure with an air pump. The air is delivered to your airway through a mask while you sleep. This continuous pressure keeps your airway open.  Nasal expiratory positive airway pressure (EPAP) device. The EPAP device creates positive air pressure as you exhale. The device consists of single-use valves, which are inserted into each nostril and held in place by adhesive. The valves create very little resistance when you inhale but create much more resistance when you exhale. That increased resistance creates the positive airway pressure. This positive pressure while you exhale keeps your airway open, making it easier to breath when you inhale again.  Bilevel positive airway pressure (BPAP) device. The BPAP device is used mainly in patients with central sleep apnea. This device is similar to the CPAP device because it also uses an air pump to deliver continuous air pressure   through a mask. However, with the BPAP machine, the pressure is set at two different levels. The pressure when you  exhale is lower than the pressure when you inhale.  Surgery. Typically, surgery is only done if you cannot comply with less invasive treatments or if the less invasive treatments do not improve your condition. Surgery involves removing excess tissue in your airway to create a wider passage way. Document Released: 12/16/2001 Document Revised: 04/22/2012 Document Reviewed: 05/04/2011 Exit[employer] Patient Information 2015 [employer]. This information is not intended to replace advice given to you by your health care provider. Make sure you discuss any questions you have with your health care provider.  

## 2013-09-29 ENCOUNTER — Telehealth: Payer: Self-pay | Admitting: Adult Health

## 2013-09-29 NOTE — Progress Notes (Addendum)
I received his CPAP download results from [facility]- 30 day download indicated 97% compliance, AHI 4.3 at a pressure of 9cm H2O with EPR 1. He uses his machine for an average of 5 hours and 59 minutes.

## 2013-09-29 NOTE — Telephone Encounter (Signed)
Called patient and and left patient a message about C Pap report stated to patient if any questions please give [place] a call back.

## 2013-09-29 NOTE — Telephone Encounter (Signed)
I received his CPAP download results from [facility]. His results were good. 97 % compliance, AHI of 4.3, uses his machine for an average of 5 hours and 59 minutes. Can you please call the patient with these results.

## 2013-10-02 DIAGNOSIS — IMO0001 Reserved for inherently not codable concepts without codable children: Secondary | ICD-10-CM | POA: Diagnosis not present

## 2013-10-02 DIAGNOSIS — E7800 Pure hypercholesterolemia, unspecified: Secondary | ICD-10-CM | POA: Diagnosis not present

## 2013-10-02 DIAGNOSIS — I10 Essential (primary) hypertension: Secondary | ICD-10-CM | POA: Diagnosis not present

## 2013-10-09 DIAGNOSIS — I2699 Other pulmonary embolism without acute cor pulmonale: Secondary | ICD-10-CM | POA: Diagnosis not present

## 2013-10-09 DIAGNOSIS — E782 Mixed hyperlipidemia: Secondary | ICD-10-CM | POA: Diagnosis not present

## 2013-10-09 DIAGNOSIS — I10 Essential (primary) hypertension: Secondary | ICD-10-CM | POA: Diagnosis not present

## 2013-10-09 DIAGNOSIS — G609 Hereditary and idiopathic neuropathy, unspecified: Secondary | ICD-10-CM | POA: Diagnosis not present

## 2013-10-09 DIAGNOSIS — Z23 Encounter for immunization: Secondary | ICD-10-CM | POA: Diagnosis not present

## 2013-10-09 DIAGNOSIS — I679 Cerebrovascular disease, unspecified: Secondary | ICD-10-CM | POA: Diagnosis not present

## 2013-10-09 DIAGNOSIS — E1140 Type 2 diabetes mellitus with diabetic neuropathy, unspecified: Secondary | ICD-10-CM | POA: Diagnosis not present

## 2013-10-09 DIAGNOSIS — A6000 Herpesviral infection of urogenital system, unspecified: Secondary | ICD-10-CM | POA: Diagnosis not present

## 2013-10-23 DIAGNOSIS — E1051 Type 1 diabetes mellitus with diabetic peripheral angiopathy without gangrene: Secondary | ICD-10-CM | POA: Diagnosis not present

## 2013-10-23 DIAGNOSIS — L603 Nail dystrophy: Secondary | ICD-10-CM | POA: Diagnosis not present

## 2013-10-23 DIAGNOSIS — I739 Peripheral vascular disease, unspecified: Secondary | ICD-10-CM | POA: Diagnosis not present

## 2013-11-05 ENCOUNTER — Telehealth: Payer: Self-pay | Admitting: Neurology

## 2013-11-05 NOTE — Telephone Encounter (Signed)
Patient calling to get scheduled for his CPAP compliance, please return call and advise.

## 2013-11-10 ENCOUNTER — Telehealth: Payer: Self-pay | Admitting: Neurology

## 2013-11-10 NOTE — Telephone Encounter (Signed)
Patient calling with complaints of both of his arms locking up, please return call and advise.

## 2013-11-11 NOTE — Telephone Encounter (Signed)
I spoke to pt.  He has trigger fingers, painful.  He had referral by Dr. Harrington Challenger to [facility], and they requested he get records from [facility] on previous care he had done there.   He is not able too.  I recommended that he call Dr. Harrington Challenger and have their office call and get appt for him.   He stated he would.

## 2013-11-17 ENCOUNTER — Encounter: Payer: Self-pay | Admitting: Neurology

## 2013-11-17 ENCOUNTER — Ambulatory Visit (INDEPENDENT_AMBULATORY_CARE_PROVIDER_SITE_OTHER): Payer: Medicare Other | Admitting: Neurology

## 2013-11-17 VITALS — BP 110/66 | HR 60 | Temp 98.0°F | Resp 14 | Ht 65.25 in | Wt 177.0 lb

## 2013-11-17 DIAGNOSIS — Z9989 Dependence on other enabling machines and devices: Principal | ICD-10-CM

## 2013-11-17 DIAGNOSIS — G4733 Obstructive sleep apnea (adult) (pediatric): Secondary | ICD-10-CM | POA: Diagnosis not present

## 2013-11-17 DIAGNOSIS — IMO0002 Reserved for concepts with insufficient information to code with codable children: Secondary | ICD-10-CM

## 2013-11-17 DIAGNOSIS — I2510 Atherosclerotic heart disease of native coronary artery without angina pectoris: Secondary | ICD-10-CM | POA: Diagnosis not present

## 2013-11-17 HISTORY — DX: Reserved for concepts with insufficient information to code with codable children: IMO0002

## 2013-11-17 NOTE — Patient Instructions (Signed)

## 2013-11-17 NOTE — Progress Notes (Signed)
PATIENT: Steve Brown DOB: 02/25/1940  REASON FOR VISIT: follow up HISTORY FROM: patient  HISTORY OF PRESENT ILLNESS: last visit note with MM, NP. 09-01-13 Mr. Krumholz is a 72 year old male with history of obstructive sleep Apnea. He returns today for a 30 day compliance download. He did not bring his machine with him. We will have to call [facility] to try to get a report. He reports that he uses the machine nightly for at least 6 hours most nights.  His Epworth score is 8 points was previously 9 points. His fatigue severity score is 32 was previously 40. Patient reports that he gets about 8 hours of sleep a night. He goes to bed between 10 and 11 pm  arises between 6:30-7:30 am.  He denies having trouble falling asleep or staying a sleep. States that the gets up about 2-3 times a night to urinate. Overall patient doesn't feel much different using the CPAP. He has only being using it one month. He still has some left shoulder pain but he continues to exercise with light 2-3lb weights. [facility]. He was getting steroid injections in the back which were helpful but it did cause him to gain some weight. He has also seen a neurosurgeon who recommended surgery. He is still contemplating this.   HISTORY 08/14/13 (CD): Mr. Mcclune is meanwhile 73 years old and has been staying active. He has controlled his weight he is physically active, he also has undergone a complete course of physical therapy since last visit. After out last visit, we had obtained an MRI of the brain without contrast and compared to the study from earlier the same year : there was no change in comparison to the May CT on this MRI dated 07-04-13 .  The patient has few scattered small vessel injuries and some lacunar infarcts.  The main risk factor for these as hypertension and diabetes- Occupational therapy saw him for left arm pain. Additional risk factor is the patient's diabetes  mellitus condition. I referred him for occupational therapy to get further insight in what may have caused his arm pain.  He noted that he could left pounds with the left 3 pounds with her right hand now there is still a significant difference .  resulting in the conduction study and EMG study which was then performed on 07-09-13, normal study no evidence of large fiber neuropathy in the left upper extremity deltoid, biceps, triceps and flexor carpi radialis as well as interosseus Muscles; no abnormal EMG activity.  He underwent a sleep study at [facility] : results from 08-07-13 reviewed today :  Mr. Felkins was diagnosed with severe sleep apnea at an AHI of 65.9. Lowest point of oxygen saturation was 70%. He was titrated to CPAP at 9 cm water he slept 92.7 minutes at that pressure of which 23 minutes of REM sleep. The AHI was now 0.6 per hour. The previously very fragmented sleep architecture became now essentially normal. The technologist used a PIC all equal nasal mask. The patient is asked to return in about 50 days for a followup with the CPAP machine.  I also reviewed his long medication list : patient has been on Xeralto for chronic anticoagulation.    11-17-13 Interval history : Mr. Mareno, who was diagnosed with severe sleep apnea at an AHI of 65.9 has made  efforts to increase his CPAP compliance.  CPAP was initiated to reduce  his secondary CVA risk.  He has used the machine over the last 30 days still only on 14 days.  A download dated 11-16-13 shows a 40% compliance for this at pressure of 9 cm water with 1 cm EPR.  On days used he will use it , the time is for 4 hours and 59 minutes.  He acknowledges that he has to work on more frequently using the machine !!! His residual AHI was excellent at 2.5 and I would predict that with a higher compliance and having a new mask and more  comfort , reduce his overall AHI even further. He endorsed today the Epworth sleepiness score at 8 points and  the  fatigue severity score at 34 points.   He endorsed in his review of systems:  joint pain, neck pain, apnea on the nights when he doesn't use the CPAP.   REVIEW OF SYSTEMS: Full 14 system review of systems performed , notable  for:   Eyes: Eye redness Gastrointestinal: Swollen abdomen  Genitourinary: Frequency of urination, urgency Musculoskeletal: Neck pain, neck stiffness  Neurological: Numbness, weakness Sleep: apnea.   ALLERGIES: Allergies  Allergen Reactions  . Phenergan [Promethazine] Other (See Comments)    Mood changes     HOME MEDICATIONS: Outpatient Prescriptions Prior to Visit  Medication Sig Dispense Refill  . Ascorbic Acid (VITAMIN C PO) Take 1 tablet by mouth daily.    . BD PEN NEEDLE NANO U/F 32G X 4 MM MISC     . chlorhexidine (PERIDEX) 0.12 % solution     . cholecalciferol (VITAMIN D) 1000 UNITS tablet Take 1,000 Units by mouth daily.    . diclofenac sodium (VOLTAREN) 1 % GEL Apply 2 g topically 3 (three) times daily as needed (pain).    . DULoxetine (CYMBALTA) 60 MG capsule Take 60 mg by mouth daily.    Marland Kitchen dutasteride (AVODART) 0.5 MG capsule Take 0.5 mg by mouth daily.    . ferrous sulfate 325 (65 FE) MG tablet Take 325 mg by mouth daily with breakfast.    . fluticasone (FLONASE) 50 MCG/ACT nasal spray Place 2 sprays into both nostrils daily as needed for allergies.     . furosemide (LASIX) 20 MG tablet Take 20 mg by mouth daily as needed (swelling).     . metFORMIN (GLUCOPHAGE) 500 MG tablet Take 2 tablets (1,000 mg total) by mouth 2 (two) times daily with a meal. (Patient taking differently: Take 500 mg by mouth 2 (two) times daily with a meal. )    . Multiple Vitamin (MULTIVITAMIN WITH MINERALS) TABS Take 1 tablet by mouth every evening.     . nitroGLYCERIN (NITROSTAT) 0.4 MG SL tablet Place 1 tablet (0.4 mg total) under the tongue every 5 (five) minutes as needed for chest pain. 25 tablet 12  . pantoprazole (PROTONIX) 40 MG tablet Take 40 mg by mouth daily.      . pregabalin (LYRICA) 300 MG capsule Take 1 capsule (300 mg total) by mouth 2 (two) times daily. 180 capsule 3  . ramipril (ALTACE) 2.5 MG capsule Take 1.25 mg by mouth every evening.     . Rivaroxaban (XARELTO) 20 MG TABS tablet Take 1 tablet (20 mg total) by mouth daily with supper. 30 tablet   . rosuvastatin (CRESTOR) 10 MG tablet Take 10 mg by mouth every evening.     . valACYclovir (VALTREX) 500 MG tablet Take 500 mg by mouth daily.    . prednisoLONE acetate (PRED FORTE) 1 % ophthalmic suspension  Place 1 drop into both eyes as needed.     . repaglinide (PRANDIN) 2 MG tablet Take 2 mg by mouth 3 (three) times daily before meals.    . tapentadol (NUCYNTA) 50 MG TABS Take 50 mg by mouth 3 (three) times daily as needed (for pain).    . ciprofloxacin (CIPRO) 500 MG tablet Take 1 tablet (500 mg total) by mouth 2 (two) times daily. 14 tablet 0  . insulin detemir (LEVEMIR) 100 UNIT/ML injection Inject 30 Units into the skin every morning.      No facility-administered medications prior to visit.    PAST MEDICAL HISTORY: Past Medical History  Diagnosis Date  . Diabetes mellitus   . Coronary artery disease   . Hypertension   . History of TIAs   . Lumbago   . Benign localized hyperplasia of prostate without urinary obstruction and other lower urinary tract symptoms (LUTS)   . Other and unspecified hyperlipidemia   . Unspecified essential hypertension   . Type II or unspecified type diabetes mellitus without mention of complication, not stated as uncontrolled   . Personal history of unspecified circulatory disease   . Unspecified fall   . Hypersomnia with sleep apnea, unspecified   . Pain in joint, multiple sites   . Degeneration of cervical intervertebral disc   . Unspecified cardiovascular disease   . Unsteady gait 05/19/2013    X 24 hours - improving. Hx prior CVA/TIAs; multiple risk factors; on Xarelto  . Stroke 2002, 2003    both sided weakness  . Trigger finger of both hands  11-17-13    PAST SURGICAL HISTORY: Past Surgical History  Procedure Laterality Date  . Hernia repair    . Carpal tunnel release      bilateral  . Elbow surgery      right elbow - nerve release  . Cervical disc surgery    . Lungs      "fluid pumped off lungs"  . Coronary stent placement  Feb. 2010  . Cardiac catheterization    . Coronary angioplasty      FAMILY HISTORY: Family History  Problem Relation Age of Onset  . Aneurysm Mother   . Cancer Father     SOCIAL HISTORY: History   Social History  . Marital Status: Divorced    Spouse Name: N/A    Number of Children: 46  . Years of Education: 12   Occupational History  .      retired   Social History Main Topics  . Smoking status: Never Smoker   . Smokeless tobacco: Never Used  . Alcohol Use: No  . Drug Use: No  . Sexual Activity: Not on file   Other Topics Concern  . Not on file   Social History Narrative   Patient lives at home alone and he is single.  Patient is retired.    Caffeine - one cups daily.   Right handed.   Patient has a high school education.   Patient has four adult children.      PHYSICAL EXAM  Filed Vitals:   11/17/13 1104  BP: 110/66  Pulse: 60  Temp: 98 F (36.7 C)  TempSrc: Oral  Resp: 14  Height: 5' 5.25" (1.657 m)  Weight: 177 lb (80.287 kg)   Body mass index is 29.24 kg/(m^2).  Generalized: Well developed, in no acute distress  NECK: Circumference 16 inches, Mallampati 4+  Neurological examination  Mentation: Alert oriented to time, place, history taking. Follows all commands  speech and language fluent Cranial nerves : Pupils were equal round reactive to light. Marland KitchenUvula tongue midline.  Neck circumference 14.75 inches. Motor:  5 over 5 strength of all 4 extremities. Good symmetric motor tone is noted throughout.  Sensory: Sensory testing is intact to soft touch on all 4 extremities.     DIAGNOSTIC DATA (LABS, IMAGING, TESTING) - I reviewed patient records, labs,  notes, testing and imaging myself where available.  Lab Results  Component Value Date   WBC 6.0 07/24/2013   HGB 12.0* 07/24/2013   HCT 36.0* 07/24/2013   MCV 89.6 07/24/2013   PLT 193 07/24/2013      Component Value Date/Time   NA 140 07/24/2013 0600   NA 140 08/14/2012 1513   K 4.5 07/24/2013 0600   K 4.3 08/14/2012 1513   CL 101 07/24/2013 0600   CO2 29 07/24/2013 0600   CO2 27 08/14/2012 1513   GLUCOSE 311* 07/24/2013 0600   GLUCOSE 262* 08/14/2012 1513   BUN 15 07/24/2013 0600   BUN 19.0 08/14/2012 1513   CREATININE 1.03 07/24/2013 0600   CREATININE 1.10 05/19/2013 1108   CREATININE 1.3 08/14/2012 1513   CALCIUM 8.8 07/24/2013 0600   CALCIUM 9.3 08/14/2012 1513   PROT 6.2 03/20/2013 1300   PROT 6.9 08/14/2012 1513   ALBUMIN 3.3* 03/20/2013 1300   ALBUMIN 3.4* 08/14/2012 1513   AST 19 03/20/2013 1300   AST 15 08/14/2012 1513   ALT 19 03/20/2013 1300   ALT 14 08/14/2012 1513   ALKPHOS 70 03/20/2013 1300   ALKPHOS 92 08/14/2012 1513   BILITOT 0.4 03/20/2013 1300   BILITOT 0.39 08/14/2012 1513   GFRNONAA 71* 07/24/2013 0600   GFRNONAA 67 05/19/2013 1108   GFRAA 82* 07/24/2013 0600   GFRAA 78 05/19/2013 1108   Lab Results  Component Value Date   CHOL 144 03/21/2013   HDL 57 03/21/2013   LDLCALC 63 03/21/2013   TRIG 122 03/21/2013   CHOLHDL 2.5 03/21/2013   Lab Results  Component Value Date   HGBA1C 12.2* 03/20/2013   No results found for: VITAMINB12 Lab Results  Component Value Date   TSH 0.618 03/20/2013      ASSESSMENT AND PLAN 73 y.o. year old male  has a past medical history of Diabetes mellitus; Coronary artery disease; Hypertension; History of TIAs; Lumbago; Benign localized hyperplasia of prostate without urinary obstruction and other lower urinary tract symptoms (LUTS); Other and unspecified hyperlipidemia; Unspecified essential hypertension; Type II or unspecified type diabetes mellitus without mention of complication, not stated as  uncontrolled; Personal history of unspecified circulatory disease; Unspecified fall; Hypersomnia with sleep apnea, unspecified; Pain in joint, multiple sites; Degeneration of cervical intervertebral disc; Unspecified cardiovascular disease; Unsteady gait (05/19/2013); Stroke (2002, 2003); and Trigger finger of both hands (11-17-13). here with:  1. OSA on CPAP. Improve compliance by placing the CPAP ready for use next to your bed site.  His fatigue score has improve but the Epworth sleepiness score has remained about the same.  The  patient should follow-up in 12  Month with his CPAP .   He should continue exercising daily.    Zephyra Bernardi, MD   11/17/2013, 11:35 AM [facility] [address], [place], [place] [address] [phone]

## 2013-11-26 ENCOUNTER — Encounter: Payer: Self-pay | Admitting: Neurology

## 2013-12-02 ENCOUNTER — Encounter: Payer: Self-pay | Admitting: Neurology

## 2013-12-11 DIAGNOSIS — H1013 Acute atopic conjunctivitis, bilateral: Secondary | ICD-10-CM | POA: Diagnosis not present

## 2013-12-12 DIAGNOSIS — M65331 Trigger finger, right middle finger: Secondary | ICD-10-CM | POA: Diagnosis not present

## 2013-12-12 DIAGNOSIS — N401 Enlarged prostate with lower urinary tract symptoms: Secondary | ICD-10-CM | POA: Diagnosis not present

## 2013-12-12 DIAGNOSIS — M65332 Trigger finger, left middle finger: Secondary | ICD-10-CM | POA: Diagnosis not present

## 2013-12-12 DIAGNOSIS — R351 Nocturia: Secondary | ICD-10-CM | POA: Diagnosis not present

## 2013-12-12 DIAGNOSIS — R972 Elevated prostate specific antigen [PSA]: Secondary | ICD-10-CM | POA: Diagnosis not present

## 2013-12-18 ENCOUNTER — Encounter (HOSPITAL_COMMUNITY): Payer: Self-pay | Admitting: Interventional Cardiology

## 2013-12-18 DIAGNOSIS — N401 Enlarged prostate with lower urinary tract symptoms: Secondary | ICD-10-CM | POA: Diagnosis not present

## 2013-12-19 DIAGNOSIS — M65331 Trigger finger, right middle finger: Secondary | ICD-10-CM | POA: Diagnosis not present

## 2013-12-19 DIAGNOSIS — M65332 Trigger finger, left middle finger: Secondary | ICD-10-CM | POA: Diagnosis not present

## 2013-12-22 DIAGNOSIS — Z79891 Long term (current) use of opiate analgesic: Secondary | ICD-10-CM | POA: Diagnosis not present

## 2013-12-22 DIAGNOSIS — E1142 Type 2 diabetes mellitus with diabetic polyneuropathy: Secondary | ICD-10-CM | POA: Diagnosis not present

## 2013-12-22 DIAGNOSIS — G894 Chronic pain syndrome: Secondary | ICD-10-CM | POA: Diagnosis not present

## 2013-12-22 DIAGNOSIS — Z79899 Other long term (current) drug therapy: Secondary | ICD-10-CM | POA: Diagnosis not present

## 2013-12-22 DIAGNOSIS — G5792 Unspecified mononeuropathy of left lower limb: Secondary | ICD-10-CM | POA: Diagnosis not present

## 2013-12-22 DIAGNOSIS — G5692 Unspecified mononeuropathy of left upper limb: Secondary | ICD-10-CM | POA: Diagnosis not present

## 2013-12-22 DIAGNOSIS — G5791 Unspecified mononeuropathy of right lower limb: Secondary | ICD-10-CM | POA: Diagnosis not present

## 2013-12-22 DIAGNOSIS — M1990 Unspecified osteoarthritis, unspecified site: Secondary | ICD-10-CM | POA: Diagnosis not present

## 2013-12-22 DIAGNOSIS — M791 Myalgia: Secondary | ICD-10-CM | POA: Diagnosis not present

## 2013-12-22 DIAGNOSIS — G5691 Unspecified mononeuropathy of right upper limb: Secondary | ICD-10-CM | POA: Diagnosis not present

## 2013-12-25 ENCOUNTER — Ambulatory Visit: Payer: Medicare Other | Admitting: Adult Health

## 2013-12-25 DIAGNOSIS — H1013 Acute atopic conjunctivitis, bilateral: Secondary | ICD-10-CM | POA: Diagnosis not present

## 2013-12-25 DIAGNOSIS — R109 Unspecified abdominal pain: Secondary | ICD-10-CM | POA: Diagnosis not present

## 2013-12-25 DIAGNOSIS — R350 Frequency of micturition: Secondary | ICD-10-CM | POA: Diagnosis not present

## 2014-01-15 DIAGNOSIS — E1051 Type 1 diabetes mellitus with diabetic peripheral angiopathy without gangrene: Secondary | ICD-10-CM | POA: Diagnosis not present

## 2014-01-15 DIAGNOSIS — I739 Peripheral vascular disease, unspecified: Secondary | ICD-10-CM | POA: Diagnosis not present

## 2014-01-15 DIAGNOSIS — L603 Nail dystrophy: Secondary | ICD-10-CM | POA: Diagnosis not present

## 2014-02-02 DIAGNOSIS — M545 Low back pain: Secondary | ICD-10-CM | POA: Diagnosis not present

## 2014-02-02 DIAGNOSIS — M4807 Spinal stenosis, lumbosacral region: Secondary | ICD-10-CM | POA: Diagnosis not present

## 2014-02-02 DIAGNOSIS — E1165 Type 2 diabetes mellitus with hyperglycemia: Secondary | ICD-10-CM | POA: Diagnosis not present

## 2014-02-02 DIAGNOSIS — M4690 Unspecified inflammatory spondylopathy, site unspecified: Secondary | ICD-10-CM | POA: Diagnosis not present

## 2014-02-10 DIAGNOSIS — M4724 Other spondylosis with radiculopathy, thoracic region: Secondary | ICD-10-CM | POA: Diagnosis not present

## 2014-02-10 DIAGNOSIS — M5416 Radiculopathy, lumbar region: Secondary | ICD-10-CM | POA: Diagnosis not present

## 2014-02-19 DIAGNOSIS — R0781 Pleurodynia: Secondary | ICD-10-CM | POA: Diagnosis not present

## 2014-02-19 DIAGNOSIS — R0789 Other chest pain: Secondary | ICD-10-CM | POA: Diagnosis not present

## 2014-03-13 DIAGNOSIS — F409 Phobic anxiety disorder, unspecified: Secondary | ICD-10-CM | POA: Diagnosis not present

## 2014-03-13 DIAGNOSIS — R109 Unspecified abdominal pain: Secondary | ICD-10-CM | POA: Diagnosis not present

## 2014-03-16 ENCOUNTER — Ambulatory Visit (INDEPENDENT_AMBULATORY_CARE_PROVIDER_SITE_OTHER): Payer: Medicare Other | Admitting: Adult Health

## 2014-03-16 ENCOUNTER — Other Ambulatory Visit: Payer: Self-pay | Admitting: Family Medicine

## 2014-03-16 ENCOUNTER — Encounter: Payer: Self-pay | Admitting: Adult Health

## 2014-03-16 VITALS — BP 127/76 | HR 67 | Ht 62.5 in | Wt 179.0 lb

## 2014-03-16 DIAGNOSIS — G4733 Obstructive sleep apnea (adult) (pediatric): Secondary | ICD-10-CM | POA: Diagnosis not present

## 2014-03-16 DIAGNOSIS — Z9989 Dependence on other enabling machines and devices: Principal | ICD-10-CM

## 2014-03-16 DIAGNOSIS — R269 Unspecified abnormalities of gait and mobility: Secondary | ICD-10-CM | POA: Diagnosis not present

## 2014-03-16 DIAGNOSIS — R109 Unspecified abdominal pain: Secondary | ICD-10-CM

## 2014-03-16 NOTE — Progress Notes (Signed)
PATIENT: Steve Brown DOB: 1940/11/24  REASON FOR VISIT: follow up- shirt to sleep apnea on CPAP HISTORY FROM: patient  HISTORY OF PRESENT ILLNESS: Steve Brown is a 74 year old male with history of obstructive sleep Apnea. He returns today for a 30 day compliance download. He brought his machine with him today and his reports shows an AHI of 0.8 at 9 cm of water, uses his machine for 4 hours and 46 minutes a night, with 13% compliance. His Epworth score is 15 points was previously 8 points. His fatigue severity score is 36 was previously 34. Patient reports that he gets about 8 hours of sleep a night. He goes to bed around 10:30 PM and arises at 6:30 AM. He denies having trouble falling asleep or staying a sleep. States that the gets up about 3-4  times a night to urinate. The patient states that he has not been using his CPAP regularly because he forgets to put it on before bedtime. The patient also states that when he is walking he tends to veer off to the right or the left. The patient does have severe stenosis in the lumbar region. He is currently being followed by pain management Dr. Vira Blanco. He denies any falls.   HISTORY: Steve Brown is a 74 year old male with history of obstructive sleep Apnea. He returns today for a 30 day compliance download. He did not bring his machine with him. We will have to call [facility] to try to get a report. He reports that he uses the machine nightly for at least 6 hours most nights. His Epworth score is 8 points was previously 9 points. His fatigue severity score is 32 was previously 40. Patient reports that he gets about 8 hours of sleep a night. He goes to bed between 10 and 11 pm arises between 6:30-7:30 am. He denies having trouble falling asleep or staying a sleep. States that the gets up about 2-3 times a night to urinate. Overall patient doesn't feel much different using the CPAP. He has only being using it one month. He still has some left  shoulder pain but he continues to exercise with light 2-3lb weights. [facility]. He was getting steroid injections in the back which were helpful but it did cause him to gain some weight. He has also seen a neurosurgeon who recommended surgery. He is still contemplating this.   HISTORY 08/14/13 (CD): Steve Brown is meanwhile 74 years old and has been staying active. He has controlled his weight he is physically active, he also has undergone a complete course of physical therapy since last visit. After out last visit, we had obtained an MRI of the brain without contrast and compared to the study from earlier the same year : there was no change in comparison to the May CT on this MRI dated 07-04-13 .  The patient has few scattered small vessel injuries and some lacunar infarcts.  The main risk factor for these as hypertension and diabetes- Occupational therapy saw him for left arm pain. Additional risk factor is the patient's diabetes mellitus condition. I referred him for occupational therapy to get further insight in what may have caused his arm pain.  He noted that he could left pounds with the left 3 pounds with her right hand now there is still a significant difference .  resulting in the conduction study and EMG study which was then performed  on 07-09-13, normal study no evidence of large fiber neuropathy in the left upper extremity deltoid, biceps, triceps and flexor carpi radialis as well as interosseus Muscles; no abnormal EMG activity.  He underwent a sleep study at [facility] : results from 08-07-13 reviewed today :  Steve Brown was diagnosed with severe sleep apnea at an AHI of 65.9. Lowest point of oxygen saturation was 70%. He was titrated to CPAP at 9 cm water he slept 92.7 minutes at that pressure of which 23 minutes of REM sleep. The AHI was now 0.6 per hour. The previously very fragmented sleep architecture became now essentially normal. The  technologist used a PIC all equal nasal mask. The patient is asked to return in about 50 days for a followup with the CPAP machine.  I also reviewed his long medication list : patient has been on Xeralto for chronic anticoagulation.   11-17-13 Interval history : Steve Brown, who was diagnosed with severe sleep apnea at an AHI of 65.9 has made efforts to increase his CPAP compliance. CPAP was initiated to reduce his secondary CVA risk.  He has used the machine over the last 30 days still only on 14 days. A download dated 11-16-13 shows a 40% compliance for this at pressure of 9 cm water with 1 cm EPR.  On days used he will use it , the time is for 4 hours and 59 minutes.  He acknowledges that he has to work on more frequently using the machine !!! His residual AHI was excellent at 2.5 and I would predict that with a higher compliance and having a new mask and more comfort , reduce his overall AHI even further. He endorsed today the Epworth sleepiness score at 8 points and the fatigue severity score at 34 points.   REVIEW OF SYSTEMS: Out of a complete 14 system review of symptoms, the patient complains only of the following symptoms, and all other reviewed systems are negative.  Fatigue, eye redness, excessive eating, frequency of urination, numbness, nervousness, neck pain, apnea  ALLERGIES: Allergies  Allergen Reactions  . Phenergan [Promethazine] Other (See Comments)    Mood changes     HOME MEDICATIONS: Outpatient Prescriptions Prior to Visit  Medication Sig Dispense Refill  . Ascorbic Acid (VITAMIN C PO) Take 1 tablet by mouth daily.    . BD PEN NEEDLE NANO U/F 32G X 4 MM MISC     . chlorhexidine (PERIDEX) 0.12 % solution     . cholecalciferol (VITAMIN D) 1000 UNITS tablet Take 1,000 Units by mouth daily.    . diclofenac sodium (VOLTAREN) 1 % GEL Apply 2 g topically 3 (three) times daily as needed (pain).    . DULoxetine (CYMBALTA) 60 MG capsule Take 60 mg by mouth daily.      Marland Kitchen dutasteride (AVODART) 0.5 MG capsule Take 0.5 mg by mouth daily.    . ferrous sulfate 325 (65 FE) MG tablet Take 325 mg by mouth daily with breakfast.    . fluticasone (FLONASE) 50 MCG/ACT nasal spray Place 2 sprays into both nostrils daily as needed for allergies.     . furosemide (LASIX) 20 MG tablet Take 20 mg by mouth daily as needed (swelling).     Marland Kitchen HUMALOG MIX 75/25 KWIKPEN (75-25) 100 UNIT/ML Kwikpen Inject into the skin. 20 units in am, 18 units in pm.  1  . metFORMIN (GLUCOPHAGE) 500 MG tablet Take 2 tablets (1,000 mg total) by mouth 2 (two) times daily with a meal. (Patient  taking differently: Take 500 mg by mouth 2 (two) times daily with a meal. )    . Multiple Vitamin (MULTIVITAMIN WITH MINERALS) TABS Take 1 tablet by mouth every evening.     . nitroGLYCERIN (NITROSTAT) 0.4 MG SL tablet Place 1 tablet (0.4 mg total) under the tongue every 5 (five) minutes as needed for chest pain. 25 tablet 12  . nystatin (MYCOSTATIN) 100000 UNIT/ML suspension   3  . pantoprazole (PROTONIX) 40 MG tablet Take 40 mg by mouth daily.    . prednisoLONE acetate (PRED FORTE) 1 % ophthalmic suspension Place 1 drop into both eyes as needed.     . pregabalin (LYRICA) 300 MG capsule Take 1 capsule (300 mg total) by mouth 2 (two) times daily. 180 capsule 3  . ramipril (ALTACE) 1.25 MG capsule Take 1.25 mg by mouth daily.   4  . ramipril (ALTACE) 2.5 MG capsule Take 1.25 mg by mouth every evening.     . repaglinide (PRANDIN) 2 MG tablet Take 2 mg by mouth 3 (three) times daily before meals.    . Rivaroxaban (XARELTO) 20 MG TABS tablet Take 1 tablet (20 mg total) by mouth daily with supper. 30 tablet   . rosuvastatin (CRESTOR) 10 MG tablet Take 10 mg by mouth every evening.     . valACYclovir (VALTREX) 500 MG tablet Take 500 mg by mouth daily.    . tapentadol (NUCYNTA) 50 MG TABS Take 50 mg by mouth 3 (three) times daily as needed (for pain).     No facility-administered medications prior to visit.    PAST  MEDICAL HISTORY: Past Medical History  Diagnosis Date  . Diabetes mellitus   . Coronary artery disease   . Hypertension   . History of TIAs   . Lumbago   . Benign localized hyperplasia of prostate without urinary obstruction and other lower urinary tract symptoms (LUTS)   . Other and unspecified hyperlipidemia   . Unspecified essential hypertension   . Type II or unspecified type diabetes mellitus without mention of complication, not stated as uncontrolled   . Personal history of unspecified circulatory disease   . Unspecified fall   . Hypersomnia with sleep apnea, unspecified   . Pain in joint, multiple sites   . Degeneration of cervical intervertebral disc   . Unspecified cardiovascular disease   . Unsteady gait 05/19/2013    X 24 hours - improving. Hx prior CVA/TIAs; multiple risk factors; on Xarelto  . Stroke 2002, 2003    both sided weakness  . Trigger finger of both hands 11-17-13    PAST SURGICAL HISTORY: Past Surgical History  Procedure Laterality Date  . Hernia repair    . Carpal tunnel release      bilateral  . Elbow surgery      right elbow - nerve release  . Cervical disc surgery    . Lungs      "fluid pumped off lungs"  . Coronary stent placement  Feb. 2010  . Cardiac catheterization    . Coronary angioplasty    . Left heart catheterization with coronary angiogram N/A 04/03/2013    Procedure: LEFT HEART CATHETERIZATION WITH CORONARY ANGIOGRAM;  Surgeon: Jettie Booze, MD;  Location: [facility] C[facility] LAB;  Service: Cardiovascular;  Laterality: N/A;    FAMILY HISTORY: Family History  Problem Relation Age of Onset  . Aneurysm Mother   . Cancer Father     SOCIAL HISTORY: History   Social History  . Marital Status: Divorced  Spouse Name: N/A  . Number of Children: 4  . Years of Education: 12   Occupational History  .      retired   Social History Main Topics  . Smoking status: Never Smoker   . Smokeless tobacco: Never Used  . Alcohol Use: No  .  Drug Use: No  . Sexual Activity: Not on file   Other Topics Concern  . Not on file   Social History Narrative   Patient lives at home alone and he is single.  Patient is retired.    Caffeine - one cups daily.   Right handed.   Patient has a high school education.   Patient has four adult children.      PHYSICAL EXAM  Filed Vitals:   03/16/14 0918  BP: 127/76  Pulse: 67  Height: 5' 2.5" (1.588 m)  Weight: 179 lb (81.194 kg)   Body mass index is 32.2 kg/(m^2).  Generalized: Well developed, in no acute distress  Neck: Conference 16-3/4 inches, Mallampati 3+  Neurological examination  Mentation: Alert oriented to time, place, history taking. Follows all commands speech and language fluent Cranial nerve II-XII: Pupils were equal round reactive to light. Extraocular movements were full, visual field were full on confrontational test. Facial sensation and strength were normal. Uvula tongue midline. Head turning and shoulder shrug  were normal and symmetric. Motor: The motor testing reveals 5 over 5 strength of all 4 extremities. Good symmetric motor tone is noted throughout.  Sensory: Sensory testing is intact to soft touch on all 4 extremities. No evidence of extinction is noted.  Coordination: Cerebellar testing reveals good finger-nose-finger and heel-to-shin bilaterally.  Gait and station: Gait is normal. .  Reflexes: Deep tendon reflexes are symmetric and normal bilaterally.     DIAGNOSTIC DATA (LABS, IMAGING, TESTING) - I reviewed patient records, labs, notes, testing and imaging myself where available.   No results found for: VITAMINB12 Lab Results  Component Value Date   TSH 0.618 03/20/2013      ASSESSMENT AND PLAN 74 y.o. year old male  has a past medical history of Diabetes mellitus; Coronary artery disease; Hypertension; History of TIAs; Lumbago; Benign localized hyperplasia of prostate without urinary obstruction and other lower urinary tract symptoms  (LUTS); Other and unspecified hyperlipidemia; Unspecified essential hypertension; Type II or unspecified type diabetes mellitus without mention of complication, not stated as uncontrolled; Personal history of unspecified circulatory disease; Unspecified fall; Hypersomnia with sleep apnea, unspecified; Pain in joint, multiple sites; Degeneration of cervical intervertebral disc; Unspecified cardiovascular disease; Unsteady gait (05/19/2013); Stroke (2002, 2003); and Trigger finger of both hands (11-17-13). here with:  1. Obstructive sleep apnea on CPAP 2. Gait disturbance  Patient only uses CPAP for 4 out of 30 days. Patient states that he simply forgets to put it on before bedtime. I advised the patient that he needs to put the CPAP on a soon as he gets in bed. I have explained that his Epworth sleepiness score has increased his visit. Most likely this is due to him not using his CPAP regularly. I also went over the risk and benefits of using and not using his CPAP. The patient states that he will start using his CPAP nightly. Patient is also complaining of some issues with his gait. He states that he tends to veer off to the right or left. This is most likely due to his ongoing back pain. Patient does have severe stenosis in the lumbar region. I advised the patient to  continue to follow-up with Dr. Vira Blanco regarding this. He verbalized understanding the patient will follow-up with this office in 2 months for a repeat download.   Ward Givens, MSN, NP-C 03/16/2014, 9:33 AM [facility] [address], [place], [place] [address] [phone]  Note: This document was prepared with digital dictation and possible smart phrase technology. Any transcriptional errors that result from this process are unintentional.

## 2014-03-16 NOTE — Patient Instructions (Signed)
Please use the CPAP nightly. Sleep Apnea  Sleep apnea is a sleep disorder characterized by abnormal pauses in breathing while you sleep. When your breathing pauses, the level of oxygen in your blood decreases. This causes you to move out of deep sleep and into light sleep. As a result, your quality of sleep is poor, and the system that carries your blood throughout your body (cardiovascular system) experiences stress. If sleep apnea remains untreated, the following conditions can develop:  High blood pressure (hypertension).  Coronary artery disease.  Inability to achieve or maintain an erection (impotence).  Impairment of your thought process (cognitive dysfunction). There are three types of sleep apnea: 1. Obstructive sleep apnea--Pauses in breathing during sleep because of a blocked airway. 2. Central sleep apnea--Pauses in breathing during sleep because the area of the brain that controls your breathing does not send the correct signals to the muscles that control breathing. 3. Mixed sleep apnea--A combination of both obstructive and central sleep apnea. RISK FACTORS The following risk factors can increase your risk of developing sleep apnea:  Being overweight.  Smoking.  Having narrow passages in your nose and throat.  Being of older age.  Being male.  Alcohol use.  Sedative and tranquilizer use.  Ethnicity. Among individuals younger than 35 years, African Americans are at increased risk of sleep apnea. SYMPTOMS   Difficulty staying asleep.  Daytime sleepiness and fatigue.  Loss of energy.  Irritability.  Loud, heavy snoring.  Morning headaches.  Trouble concentrating.  Forgetfulness.  Decreased interest in sex. DIAGNOSIS  In order to diagnose sleep apnea, your caregiver will perform a physical examination. Your caregiver may suggest that you take a home sleep test. Your caregiver may also recommend that you spend the night in a sleep lab. In the sleep lab,  several monitors record information about your heart, lungs, and brain while you sleep. Your leg and arm movements and blood oxygen level are also recorded. TREATMENT The following actions may help to resolve mild sleep apnea:  Sleeping on your side.   Using a decongestant if you have nasal congestion.   Avoiding the use of depressants, including alcohol, sedatives, and narcotics.   Losing weight and modifying your diet if you are overweight. There also are devices and treatments to help open your airway:  Oral appliances. These are custom-made mouthpieces that shift your lower jaw forward and slightly open your bite. This opens your airway.  Devices that create positive airway pressure. This positive pressure "splints" your airway open to help you breathe better during sleep. The following devices create positive airway pressure:  Continuous positive airway pressure (CPAP) device. The CPAP device creates a continuous level of air pressure with an air pump. The air is delivered to your airway through a mask while you sleep. This continuous pressure keeps your airway open.  Nasal expiratory positive airway pressure (EPAP) device. The EPAP device creates positive air pressure as you exhale. The device consists of single-use valves, which are inserted into each nostril and held in place by adhesive. The valves create very little resistance when you inhale but create much more resistance when you exhale. That increased resistance creates the positive airway pressure. This positive pressure while you exhale keeps your airway open, making it easier to breath when you inhale again.  Bilevel positive airway pressure (BPAP) device. The BPAP device is used mainly in patients with central sleep apnea. This device is similar to the CPAP device because it also uses an air pump  to deliver continuous air pressure through a mask. However, with the BPAP machine, the pressure is set at two different levels.  The pressure when you exhale is lower than the pressure when you inhale.  Surgery. Typically, surgery is only done if you cannot comply with less invasive treatments or if the less invasive treatments do not improve your condition. Surgery involves removing excess tissue in your airway to create a wider passage way. Document Released: 12/16/2001 Document Revised: 04/22/2012 Document Reviewed: 05/04/2011 [facility] Patient Information 2015 [place], [place]. This information is not intended to replace advice given to you by your health care provider. Make sure you discuss any questions you have with your health care provider.

## 2014-03-16 NOTE — Progress Notes (Signed)
I agree with the assessment and plan as directed by NP .The patient is known to me .   Owen Pagnotta, MD  

## 2014-03-17 ENCOUNTER — Encounter: Payer: Self-pay | Admitting: Neurology

## 2014-03-17 DIAGNOSIS — M545 Low back pain: Secondary | ICD-10-CM | POA: Diagnosis not present

## 2014-03-17 DIAGNOSIS — M79606 Pain in leg, unspecified: Secondary | ICD-10-CM | POA: Diagnosis not present

## 2014-03-17 DIAGNOSIS — G609 Hereditary and idiopathic neuropathy, unspecified: Secondary | ICD-10-CM | POA: Diagnosis not present

## 2014-03-17 DIAGNOSIS — Z79899 Other long term (current) drug therapy: Secondary | ICD-10-CM | POA: Diagnosis not present

## 2014-03-17 DIAGNOSIS — H1012 Acute atopic conjunctivitis, left eye: Secondary | ICD-10-CM | POA: Diagnosis not present

## 2014-03-17 DIAGNOSIS — G894 Chronic pain syndrome: Secondary | ICD-10-CM | POA: Diagnosis not present

## 2014-03-17 DIAGNOSIS — E1165 Type 2 diabetes mellitus with hyperglycemia: Secondary | ICD-10-CM | POA: Diagnosis not present

## 2014-03-19 ENCOUNTER — Other Ambulatory Visit: Payer: Self-pay | Admitting: Obstetrics and Gynecology

## 2014-03-19 DIAGNOSIS — R109 Unspecified abdominal pain: Secondary | ICD-10-CM | POA: Diagnosis not present

## 2014-03-19 LAB — CREATININE, SERUM: Creat: 1.29 mg/dL (ref 0.50–1.35)

## 2014-03-19 LAB — BUN: BUN: 18 mg/dL (ref 6–23)

## 2014-03-20 ENCOUNTER — Ambulatory Visit
Admission: RE | Admit: 2014-03-20 | Discharge: 2014-03-20 | Disposition: A | Discharge status: Home / Self Care | Payer: Medicare Other | Source: Ambulatory Visit | Attending: Family Medicine | Admitting: Family Medicine

## 2014-03-20 DIAGNOSIS — R1012 Left upper quadrant pain: Secondary | ICD-10-CM | POA: Diagnosis not present

## 2014-03-20 DIAGNOSIS — R109 Unspecified abdominal pain: Secondary | ICD-10-CM

## 2014-03-20 MED ORDER — IOPAMIDOL (ISOVUE-300) INJECTION 61%
100.0000 mL | Freq: Once | INTRAVENOUS | Status: AC | PRN
  Administered 2014-03-20: 100 mL via INTRAVENOUS

## 2014-03-26 DIAGNOSIS — I739 Peripheral vascular disease, unspecified: Secondary | ICD-10-CM | POA: Diagnosis not present

## 2014-03-26 DIAGNOSIS — L603 Nail dystrophy: Secondary | ICD-10-CM | POA: Diagnosis not present

## 2014-03-26 DIAGNOSIS — E1051 Type 1 diabetes mellitus with diabetic peripheral angiopathy without gangrene: Secondary | ICD-10-CM | POA: Diagnosis not present

## 2014-04-09 DIAGNOSIS — R05 Cough: Secondary | ICD-10-CM | POA: Diagnosis not present

## 2014-04-09 DIAGNOSIS — R52 Pain, unspecified: Secondary | ICD-10-CM | POA: Diagnosis not present

## 2014-04-14 DIAGNOSIS — M5417 Radiculopathy, lumbosacral region: Secondary | ICD-10-CM | POA: Diagnosis not present

## 2014-04-14 DIAGNOSIS — M5412 Radiculopathy, cervical region: Secondary | ICD-10-CM | POA: Diagnosis not present

## 2014-04-14 DIAGNOSIS — M1990 Unspecified osteoarthritis, unspecified site: Secondary | ICD-10-CM | POA: Diagnosis not present

## 2014-04-14 DIAGNOSIS — G629 Polyneuropathy, unspecified: Secondary | ICD-10-CM | POA: Diagnosis not present

## 2014-05-01 DIAGNOSIS — E782 Mixed hyperlipidemia: Secondary | ICD-10-CM | POA: Diagnosis not present

## 2014-05-01 DIAGNOSIS — E559 Vitamin D deficiency, unspecified: Secondary | ICD-10-CM | POA: Diagnosis not present

## 2014-05-01 DIAGNOSIS — N400 Enlarged prostate without lower urinary tract symptoms: Secondary | ICD-10-CM | POA: Diagnosis not present

## 2014-05-01 DIAGNOSIS — I10 Essential (primary) hypertension: Secondary | ICD-10-CM | POA: Diagnosis not present

## 2014-05-01 DIAGNOSIS — E1140 Type 2 diabetes mellitus with diabetic neuropathy, unspecified: Secondary | ICD-10-CM | POA: Diagnosis not present

## 2014-05-01 DIAGNOSIS — D649 Anemia, unspecified: Secondary | ICD-10-CM | POA: Diagnosis not present

## 2014-05-07 DIAGNOSIS — Z1389 Encounter for screening for other disorder: Secondary | ICD-10-CM | POA: Diagnosis not present

## 2014-05-07 DIAGNOSIS — Z0001 Encounter for general adult medical examination with abnormal findings: Secondary | ICD-10-CM | POA: Diagnosis not present

## 2014-05-11 DIAGNOSIS — M545 Low back pain: Secondary | ICD-10-CM | POA: Diagnosis not present

## 2014-05-18 ENCOUNTER — Ambulatory Visit (INDEPENDENT_AMBULATORY_CARE_PROVIDER_SITE_OTHER): Payer: Medicare Other | Admitting: Neurology

## 2014-05-18 ENCOUNTER — Encounter: Payer: Self-pay | Admitting: Neurology

## 2014-05-18 VITALS — BP 128/68 | HR 70 | Resp 18 | Ht 64.96 in | Wt 191.0 lb

## 2014-05-18 DIAGNOSIS — G4733 Obstructive sleep apnea (adult) (pediatric): Secondary | ICD-10-CM | POA: Diagnosis not present

## 2014-05-18 DIAGNOSIS — Z9989 Dependence on other enabling machines and devices: Principal | ICD-10-CM

## 2014-05-18 DIAGNOSIS — J320 Chronic maxillary sinusitis: Secondary | ICD-10-CM | POA: Diagnosis not present

## 2014-05-18 DIAGNOSIS — M4806 Spinal stenosis, lumbar region: Secondary | ICD-10-CM | POA: Diagnosis not present

## 2014-05-18 DIAGNOSIS — M4802 Spinal stenosis, cervical region: Secondary | ICD-10-CM | POA: Diagnosis not present

## 2014-05-18 DIAGNOSIS — J410 Simple chronic bronchitis: Secondary | ICD-10-CM | POA: Diagnosis not present

## 2014-05-18 DIAGNOSIS — J322 Chronic ethmoidal sinusitis: Secondary | ICD-10-CM | POA: Diagnosis not present

## 2014-05-18 DIAGNOSIS — M48062 Spinal stenosis, lumbar region with neurogenic claudication: Secondary | ICD-10-CM

## 2014-05-18 DIAGNOSIS — J040 Acute laryngitis: Secondary | ICD-10-CM | POA: Diagnosis not present

## 2014-05-18 NOTE — Progress Notes (Signed)
PATIENT: Steve Brown DOB: [birth year]  REASON FOR VISIT: follow up- shirt to sleep apnea on CPAP HISTORY FROM: patient  HISTORY OF PRESENT ILLNESS: Steve Brown is a 74 year old male with history of obstructive sleep Apnea. He returns today for a 30 day compliance download. He brought his machine with him today and his reports shows an AHI of 0.8 at 9 cm of water, uses his machine for 4 hours and 46 minutes a night, with 13% compliance. His Epworth score is 15 points was previously 8 points. His fatigue severity score is 36 was previously 34. Patient reports that he gets about 8 hours of sleep a night. He goes to bed around 10:30 PM and arises at 6:30 AM. He denies having trouble falling asleep or staying a sleep. States that the gets up about 3-4  times a night to urinate. The patient states that he has not been using his CPAP regularly because he forgets to put it on before bedtime. The patient also states that when he is walking he tends to veer off to the right or the left. The patient does have severe stenosis in the lumbar region. He is currently being followed by pain management Dr. Vira Blanco. He denies any falls.   HISTORY: Steve Brown is a 74 year old male with history of obstructive sleep Apnea. He returns today for a 30 day compliance download. He did not bring his machine with him. We will have to call [facility] home [facility] to try to get a report. He reports that he uses the machine nightly for at least 6 hours most nights. His Epworth score is 8 points was previously 9 points. His fatigue severity score is 32 was previously 40. Patient reports that he gets about 8 hours of sleep a night. He goes to bed between 10 and 11 pm arises between 6:30-7:30 am. He denies having trouble falling asleep or staying a sleep. States that the gets up about 2-3 times a night to urinate. Overall patient doesn't feel much different using the CPAP. He has only being using it one month. He still has some left  shoulder pain but he continues to exercise with light 2-3lb weights. He [facility]. He was getting steroid injections in the back which were helpful but it did cause him to gain some weight. He has also seen a neurosurgeon who recommended surgery. He is still contemplating this.   HISTORY 08/14/13 (CD): Steve Brown is meanwhile 74 years old and has been staying active. He has controlled his weight he is physically active, he also has undergone a complete course of physical therapy since last visit. After out last visit, we had obtained an MRI of the brain without contrast and compared to the study from earlier the same year : there was no change in comparison to the May CT on this MRI dated 07-04-13 .  The patient has few scattered small vessel injuries and some lacunar infarcts.  The main risk factor for these as hypertension and diabetes- Occupational therapy saw him for left arm pain. Additional risk factor is the patient's diabetes mellitus condition. I referred him for occupational therapy to get further insight in what may have caused his arm pain.  He noted that he could left pounds with the left 3 pounds with her right hand now there is still a significant difference .  resulting in the conduction study and EMG study which was then performed  on 07-09-13, normal study no evidence of large fiber neuropathy in the left upper extremity deltoid, biceps, triceps and flexor carpi radialis as well as interosseus Muscles; no abnormal EMG activity.  He underwent a sleep study at [facility] : results from 08-07-13 reviewed today :  Steve Brown was diagnosed with severe sleep apnea at an AHI of 65.9. Lowest point of oxygen saturation was 70%. He was titrated to CPAP at 9 cm water he slept 92.7 minutes at that pressure of which 23 minutes of REM sleep. The AHI was now 0.6 per hour. The previously very fragmented sleep architecture became now essentially normal. The  technologist used a PIC all equal nasal mask. The patient is asked to return in about 50 days for a followup with the CPAP machine.  I also reviewed his long medication list : patient has been on Xeralto for chronic anticoagulation.  : Steve Brown, who was diagnosed with severe sleep apnea at an AHI of 65.9,  has made efforts to increase his CPAP compliance. CPAP was initiated to reduce his secondary CVA risk.  He has used the machine over the last 30 days still only on 18 days, and all these days over 4 hours his compliance is 53% his average user time is 6 hours and 18 minutes his EPR is 1 cm and a set pressure 9 cm water unchanged from the last year. His residual AHI was 0.9 documenting that the setting is effective. Fatigue severity score was only 18 and sleepiness score 6 points again I at contribute to the lower sleepiness score also to his use of CPAP. He stated that he had some sinusitis problems that kept him from using CPAP and has just seen Dr. Ernesto Rutherford who prescribed no antibiotics after other measures had failed. He is reluctant to use an anti-decongestant which I would also not recommend because it can  spike his blood pressure. He has Flonase which Dr. Harrington Challenger his primary care physician has prescribed for him. I will see him again in one year for CPAP compliance visit but urged him to try to use the machine every night or at least if he has a medical reason not to use it to discontinue the use for 3 or 4 days until he recovers and then start again. He has also gained some weight which is a risk factor for obstructive sleep apnea alongside his retrognathia. He is now 185 pounds.   ALLERGIES: Allergies  Allergen Reactions  . Phenergan [Promethazine] Other (See Comments)    Mood changes     HOME MEDICATIONS: Outpatient Prescriptions Prior to Visit  Medication Sig Dispense Refill  . Ascorbic Acid (VITAMIN C PO) Take 1 tablet by mouth daily.    . BD PEN NEEDLE NANO U/F 32G X 4 MM MISC       . chlorhexidine (PERIDEX) 0.12 % solution     . cholecalciferol (VITAMIN D) 1000 UNITS tablet Take 1,000 Units by mouth daily.    . diclofenac sodium (VOLTAREN) 1 % GEL Apply 2 g topically 3 (three) times daily as needed (pain).    . DULoxetine (CYMBALTA) 60 MG capsule Take 60 mg by mouth daily.    Marland Kitchen dutasteride (AVODART) 0.5 MG capsule Take 0.5 mg by mouth daily.    . ferrous sulfate 325 (65 FE) MG tablet Take 325 mg by mouth daily with breakfast.    . fluticasone (FLONASE) 50 MCG/ACT nasal spray Place 2 sprays into both nostrils daily as needed for allergies.     Marland Kitchen  furosemide (LASIX) 20 MG tablet Take 20 mg by mouth daily as needed (swelling).     Marland Kitchen HUMALOG MIX 75/25 KWIKPEN (75-25) 100 UNIT/ML Kwikpen Inject into the skin. 20 units in am, 18 units in pm.  1  . metFORMIN (GLUCOPHAGE) 500 MG tablet Take 2 tablets (1,000 mg total) by mouth 2 (two) times daily with a meal. (Patient taking differently: Take 500 mg by mouth 2 (two) times daily with a meal. )    . Multiple Vitamin (MULTIVITAMIN WITH MINERALS) TABS Take 1 tablet by mouth every evening.     . nitroGLYCERIN (NITROSTAT) 0.4 MG SL tablet Place 1 tablet (0.4 mg total) under the tongue every 5 (five) minutes as needed for chest pain. 25 tablet 12  . nystatin (MYCOSTATIN) 100000 UNIT/ML suspension   3  . pantoprazole (PROTONIX) 40 MG tablet Take 40 mg by mouth daily.    . prednisoLONE acetate (PRED FORTE) 1 % ophthalmic suspension Place 1 drop into both eyes as needed.     . pregabalin (LYRICA) 300 MG capsule Take 1 capsule (300 mg total) by mouth 2 (two) times daily. 180 capsule 3  . ramipril (ALTACE) 1.25 MG capsule Take 1.25 mg by mouth daily.   4  . ramipril (ALTACE) 2.5 MG capsule Take 1.25 mg by mouth every evening.     . repaglinide (PRANDIN) 2 MG tablet Take 2 mg by mouth 3 (three) times daily before meals.    . Rivaroxaban (XARELTO) 20 MG TABS tablet Take 1 tablet (20 mg total) by mouth daily with supper. 30 tablet   .  rosuvastatin (CRESTOR) 10 MG tablet Take 10 mg by mouth every evening.     . valACYclovir (VALTREX) 500 MG tablet Take 500 mg by mouth daily.     No facility-administered medications prior to visit.    PAST MEDICAL HISTORY: Past Medical History  Diagnosis Date  . Diabetes mellitus   . Coronary artery disease   . Hypertension   . History of TIAs   . Lumbago   . Benign localized hyperplasia of prostate without urinary obstruction and other lower urinary tract symptoms (LUTS)   . Other and unspecified hyperlipidemia   . Unspecified essential hypertension   . Type II or unspecified type diabetes mellitus without mention of complication, not stated as uncontrolled   . Personal history of unspecified circulatory disease   . Unspecified fall   . Hypersomnia with sleep apnea, unspecified   . Pain in joint, multiple sites   . Degeneration of cervical intervertebral disc   . Unspecified cardiovascular disease   . Unsteady gait 05/19/2013    X 24 hours - improving. Hx prior CVA/TIAs; multiple risk factors; on Xarelto  . Stroke 2002, 2003    both sided weakness  . Trigger finger of both hands 11-17-13    PAST SURGICAL HISTORY: Past Surgical History  Procedure Laterality Date  . Hernia repair    . Carpal tunnel release      bilateral  . Elbow surgery      right elbow - nerve release  . Cervical disc surgery    . Lungs      "fluid pumped off lungs"  . Coronary stent placement  Feb. 2010  . Cardiac catheterization    . Coronary angioplasty    . Left heart catheterization with coronary angiogram N/A 04/03/2013    Procedure: LEFT HEART CATHETERIZATION WITH CORONARY ANGIOGRAM;  Surgeon: Jettie Booze, MD;  Location: [facility] CATH LAB;  Service: Cardiovascular;  Laterality: N/A;    FAMILY HISTORY: Family History  Problem Relation Age of Onset  . Aneurysm Mother   . Cancer Father     SOCIAL HISTORY: History   Social History  . Marital Status: Divorced    Spouse Name: N/A  .  Number of Children: 4  . Years of Education: 12   Occupational History  .      retired   Social History Main Topics  . Smoking status: Never Smoker   . Smokeless tobacco: Never Used  . Alcohol Use: No  . Drug Use: No  . Sexual Activity: Not on file   Other Topics Concern  . Not on file   Social History Narrative   Patient lives at home alone and he is single.  Patient is retired.    Caffeine - one cups daily.   Right handed.   Patient has a high school education.   Patient has four adult children.      PHYSICAL EXAM  Filed Vitals:   05/18/14 1550  BP: 128/68  Pulse: 70  Resp: 18  Height: 5' 4.96" (1.65 m)  Weight: 191 lb (86.637 kg)   Body mass index is 31.82 kg/(m^2).  Generalized: Well developed, in no acute distress  Neck: Conference 16-3/4 inches, Mallampati 3+  Neurological examination  Mentation: Alert oriented to time, place, history taking.  Follows all commands speech and language fluent  Facial sensation and strength were normal. Uvula tongue midline. Head turning and shoulder shrug  were normal and symmetric. Motor: The motor testing reveals 5 over 5 strength of all 4 extremities. Good symmetric motor tone is noted throughout.  Sensory: Sensory testing is intact to soft touch on all 4 extremities. No evidence of extinction is noted.  Coordination: Cerebellar testing reveals good finger-nose-finger and heel-to-shin bilaterally.  Gait and station: Gait is normal. .  Reflexes: Deep tendon reflexes are symmetric and normal bilaterally.     DIAGNOSTIC DATA (LABS, IMAGING, TESTING) - I reviewed patient records, labs, notes, testing and imaging myself where available.    ASSESSMENT AND PLAN 1. Obstructive sleep apnea on CPAP- compliance is poor at 53% ,  resulting AHI is excellent at 0.9 .  He replied that he uses it every day, the machine may record wrong.EPworth 6 and FSS 18.   He then advised m that he has rhinitis and this hurt when  using CPAP. Dr  Ernesto Rutherford.  2. Gait disturbance due to severe spinal stenosis.  3. Cognitive status is intact.   Patient only uses CPAP for 17 out of 30 , last time  4 out of 30 days.I advised the patient that he needs to put the CPAP on a soon as he gets in bed.  I have explained that his Epworth sleepiness score has been .  Most likely this is due to him not using his CPAP regularly. I also went over the risk and benefits of using and not using his CPAP. The patient states that he will start using his CPAP nightly.   He states that he tends to veer off to the right or left.   This is most likely due to his ongoing back pain. Patient does have severe stenosis in the lumbar region.    I advised the patient to continue to follow-up with Dr. Vira Blanco regarding this.  He verbalized understanding-  the patient will follow-up with NP in this office in 12 months for a repeat download.   Larey Seat, MD  05/18/2014, 4:01  PM [facility] [address] [place], [place] [address] [phone]  Note: This document was prepared with digital dictation and possible smart phrase technology. Any transcriptional errors that result from this process are unintentional.

## 2014-05-18 NOTE — Addendum Note (Signed)
Addended by: Larey Seat on: 05/18/2014 04:27 PM   Modules accepted: Orders

## 2014-05-26 ENCOUNTER — Other Ambulatory Visit (HOSPITAL_COMMUNITY): Payer: Self-pay | Admitting: Otolaryngology

## 2014-05-26 DIAGNOSIS — R519 Headache, unspecified: Secondary | ICD-10-CM

## 2014-05-26 DIAGNOSIS — R51 Headache: Principal | ICD-10-CM

## 2014-05-26 DIAGNOSIS — J322 Chronic ethmoidal sinusitis: Secondary | ICD-10-CM | POA: Diagnosis not present

## 2014-05-26 DIAGNOSIS — J320 Chronic maxillary sinusitis: Secondary | ICD-10-CM | POA: Diagnosis not present

## 2014-05-27 ENCOUNTER — Encounter: Payer: Self-pay | Admitting: Interventional Cardiology

## 2014-05-27 ENCOUNTER — Ambulatory Visit (INDEPENDENT_AMBULATORY_CARE_PROVIDER_SITE_OTHER): Payer: Medicare Other | Admitting: Interventional Cardiology

## 2014-05-27 VITALS — BP 140/86 | HR 61 | Ht 65.0 in | Wt 190.1 lb

## 2014-05-27 DIAGNOSIS — I2510 Atherosclerotic heart disease of native coronary artery without angina pectoris: Secondary | ICD-10-CM

## 2014-05-27 DIAGNOSIS — I10 Essential (primary) hypertension: Secondary | ICD-10-CM | POA: Diagnosis not present

## 2014-05-27 DIAGNOSIS — E782 Mixed hyperlipidemia: Secondary | ICD-10-CM

## 2014-05-27 DIAGNOSIS — Z86711 Personal history of pulmonary embolism: Secondary | ICD-10-CM

## 2014-05-27 NOTE — Progress Notes (Signed)
Patient ID: Steve Brown, male   DOB: June 15, 1940, 74 y.o.   MRN: [id number]     Cardiology Office Note   Date:  05/27/2014   ID:  Steve Brown, DOB [birth year], MRN [id number]  PCP:   Melinda Crutch, MD    No chief complaint on file. f/u CAD   Wt Readings from Last 3 Encounters:  05/27/14 190 lb 1.9 oz (86.238 kg)  05/18/14 191 lb (86.637 kg)  03/16/14 179 lb (81.194 kg)       History of Present Illness: Steve Brown is a 74 y.o. male   who had an LAD stent in 2010. 2012 cath showed patent stent and no significant CAD. He had bilateral PE in 7/14. He has been on Xarelto ever since. He had a cath showing moderate disease which was negative by FFR in 2014.  No further CP.   He had neck surgery in 2004.  Does silver sneakers and walks the track. He is limited by his back.      Past Medical History  Diagnosis Date  . Diabetes mellitus   . Coronary artery disease   . Hypertension   . History of TIAs   . Lumbago   . Benign localized hyperplasia of prostate without urinary obstruction and other lower urinary tract symptoms (LUTS)   . Other and unspecified hyperlipidemia   . Unspecified essential hypertension   . Type II or unspecified type diabetes mellitus without mention of complication, not stated as uncontrolled   . Personal history of unspecified circulatory disease   . Unspecified fall   . Hypersomnia with sleep apnea, unspecified   . Pain in joint, multiple sites   . Degeneration of cervical intervertebral disc   . Unspecified cardiovascular disease   . Unsteady gait 05/19/2013    X 24 hours - improving. Hx prior CVA/TIAs; multiple risk factors; on Xarelto  . Stroke 2002, 2003    both sided weakness  . Trigger finger of both hands 11-17-13    Past Surgical History  Procedure Laterality Date  . Hernia repair    . Carpal tunnel release      bilateral  . Elbow surgery      right elbow - nerve release  . Cervical disc surgery    . Lungs      "fluid  pumped off lungs"  . Coronary stent placement  Feb. 2010  . Cardiac catheterization    . Coronary angioplasty    . Left heart catheterization with coronary angiogram N/A 04/03/2013    Procedure: LEFT HEART CATHETERIZATION WITH CORONARY ANGIOGRAM;  Surgeon: Jettie Booze, MD;  Location: [facility] CATH LAB;  Service: Cardiovascular;  Laterality: N/A;     Current Outpatient Prescriptions  Medication Sig Dispense Refill  . Ascorbic Acid (VITAMIN C PO) Take 1 tablet by mouth daily.    . BD PEN NEEDLE NANO U/F 32G X 4 MM MISC     . cefUROXime (CEFTIN) 250 MG tablet Take 250 mg by mouth 2 (two) times daily.  0  . chlorhexidine (PERIDEX) 0.12 % solution Use as directed 15 mLs in the mouth or throat 2 (two) times daily.     . cholecalciferol (VITAMIN D) 1000 UNITS tablet Take 1,000 Units by mouth daily.    . diclofenac sodium (VOLTAREN) 1 % GEL Apply 2 g topically 3 (three) times daily as needed (pain).    . DULoxetine (CYMBALTA) 60 MG capsule Take 60 mg by mouth daily.    Marland Kitchen dutasteride (AVODART) 0.5 MG capsule  Take 0.5 mg by mouth daily.    . ferrous sulfate 325 (65 FE) MG tablet Take 325 mg by mouth daily with breakfast.    . fluticasone (FLONASE) 50 MCG/ACT nasal spray Place 2 sprays into both nostrils daily as needed for allergies.     . furosemide (LASIX) 20 MG tablet Take 20 mg by mouth daily as needed (swelling).     Marland Kitchen HUMALOG MIX 75/25 KWIKPEN (75-25) 100 UNIT/ML Kwikpen Inject into the skin. 20 units in am, 18 units in pm.  1  . lactobacillus acidophilus & bulgar (LACTINEX) chewable tablet Chew 1 tablet by mouth 2 (two) times daily.  0  . metFORMIN (GLUCOPHAGE) 500 MG tablet Take 2 tablets (1,000 mg total) by mouth 2 (two) times daily with a meal. (Patient taking differently: Take 500 mg by mouth 2 (two) times daily with a meal. )    . Multiple Vitamin (MULTIVITAMIN WITH MINERALS) TABS Take 1 tablet by mouth every evening.     . nitroGLYCERIN (NITROSTAT) 0.4 MG SL tablet Place 1 tablet (0.4  mg total) under the tongue every 5 (five) minutes as needed for chest pain. 25 tablet 12  . nystatin (MYCOSTATIN) 100000 UNIT/ML suspension   3  . pantoprazole (PROTONIX) 40 MG tablet Take 40 mg by mouth daily.    . prednisoLONE acetate (PRED FORTE) 1 % ophthalmic suspension Place 1 drop into both eyes as needed.     . pregabalin (LYRICA) 300 MG capsule Take 1 capsule (300 mg total) by mouth 2 (two) times daily. 180 capsule 3  . ramipril (ALTACE) 1.25 MG capsule Take 1.25 mg by mouth daily.   4  . ramipril (ALTACE) 2.5 MG capsule Take 1.25 mg by mouth every evening.     . repaglinide (PRANDIN) 2 MG tablet Take 2 mg by mouth 3 (three) times daily before meals.    . Rivaroxaban (XARELTO) 20 MG TABS tablet Take 1 tablet (20 mg total) by mouth daily with supper. 30 tablet   . rosuvastatin (CRESTOR) 10 MG tablet Take 10 mg by mouth every evening.     . valACYclovir (VALTREX) 500 MG tablet Take 500 mg by mouth daily.     No current facility-administered medications for this visit.    Allergies:   Phenergan    Social History:  The patient  reports that he has never smoked. He has never used smokeless tobacco. He reports that he does not drink alcohol or use illicit drugs.   Family History:  The patient's *family history includes Aneurysm in his mother; Cancer in his father.    ROS:  Please see the history of present illness.   Otherwise, review of systems are positive for back pain, treated with shots.  Weight gain due to the injected steroid.   All other systems are reviewed and negative.    PHYSICAL EXAM: VS:  BP 140/86 mmHg  Pulse 61  Ht 5\' 5"  (1.651 m)  Wt 190 lb 1.9 oz (86.238 kg)  BMI 31.64 kg/m2 , BMI Body mass index is 31.64 kg/(m^2). GEN: Well nourished, well developed, in no acute distress HEENT: normal Neck: no JVD, carotid bruits, or masses Cardiac: RRR; no murmurs, rubs, or gallops,no edema  Respiratory:  clear to auscultation bilaterally, normal work of breathing GI: soft,  nontender, nondistended, + BS MS: no deformity or atrophy Skin: warm and dry, no rash Neuro:  Strength and sensation are intact Psych: euthymic mood, full affect   EKG:   The ekg ordered today  demonstrates NSR, no ST segment   Recent Labs: 07/24/2013: Hemoglobin 12.0*; Platelets 193; Potassium 4.5; Sodium 140 03/19/2014: BUN 18; Creatinine 1.29   Lipid Panel    Component Value Date/Time   CHOL 144 03/21/2013 0400   TRIG 122 03/21/2013 0400   HDL 57 03/21/2013 0400   CHOLHDL 2.5 03/21/2013 0400   VLDL 24 03/21/2013 0400   LDLCALC 63 03/21/2013 0400     Other studies Reviewed: Additional studies/ records that were reviewed today with results demonstrating: DES in LAD.   ASSESSMENT AND PLAN:  CAD:  LAD DE stent in 2010.  He did have anterior T wave inversions with large bilateral PE. No problems with walking. Was Off antiplatelet therapy due to Xarelto. He feels better taking the baby asirin in adddition.  If there are beelding problems, he will stop the aspirin.  2. Essential hypertension, benign  Continue Ramipril Capsule, 2.5 MG, TAKE 1 CAPSULE EVERY DAY Notes: Today, borderlineControlled. Checked at the pharmacy.  Controlled.  No low readings. COuld in increase ramipril if readings increase.   3. Pulmonary embolism  Continue Xarelto 20 mg tablet, ., 1 tablet, orally, once a day Notes: Large bilateral PE in 7/14. He saw Dr. Beryle Beams.  Long term anticoagulation recommended.   4. Hyperlipidemia: LDL 70, HDL > 50 in 4/15. continue Crestor.  Obtain readings from Dr. Harrington Challenger office.     Current medicines are reviewed at length with the patient today.  The patient concerns regarding his medicines were addressed.  The following changes have been made:  No change  Try to find exercises that are easy on his back.  Labs/ tests ordered today include:  No orders of the defined types were placed in this encounter.    Recommend 150 minutes/week of aerobic exercise Low fat,  low carb, high fiber diet recommended  Disposition:   FU in 1 year   Teresita Madura., MD  05/27/2014 12:25 PM    [facility], [place], [place]  [address] Phone: [phone]; Fax: [phone]

## 2014-05-27 NOTE — Patient Instructions (Addendum)
Medication Instructions:  Your physician recommends that you continue on your current medications as directed. Please refer to the Current Medication list given to you today.   Labwork: We will get a copy of your lab work from Dr. Alan Ripper office.  Testing/Procedures: NONE  Follow-Up: Your physician wants you to follow-up in: 12 months with [name]. You will receive a reminder letter in the mail two months in advance. If you don't receive a letter, please call our office to schedule the follow-up appointment.   Any Other Special Instructions Will Be Listed Below (If Applicable).

## 2014-06-04 DIAGNOSIS — L603 Nail dystrophy: Secondary | ICD-10-CM | POA: Diagnosis not present

## 2014-06-04 DIAGNOSIS — I739 Peripheral vascular disease, unspecified: Secondary | ICD-10-CM | POA: Diagnosis not present

## 2014-06-04 DIAGNOSIS — E1051 Type 1 diabetes mellitus with diabetic peripheral angiopathy without gangrene: Secondary | ICD-10-CM | POA: Diagnosis not present

## 2014-06-05 ENCOUNTER — Ambulatory Visit (HOSPITAL_COMMUNITY)
Admission: RE | Admit: 2014-06-05 | Discharge: 2014-06-05 | Disposition: A | Discharge status: Home / Self Care | Payer: Medicare Other | Source: Ambulatory Visit | Attending: Otolaryngology | Admitting: Otolaryngology

## 2014-06-05 ENCOUNTER — Ambulatory Visit (HOSPITAL_COMMUNITY): Payer: 59

## 2014-06-05 DIAGNOSIS — R51 Headache: Secondary | ICD-10-CM | POA: Diagnosis not present

## 2014-06-05 DIAGNOSIS — Z8673 Personal history of transient ischemic attack (TIA), and cerebral infarction without residual deficits: Secondary | ICD-10-CM | POA: Diagnosis not present

## 2014-06-05 DIAGNOSIS — R519 Headache, unspecified: Secondary | ICD-10-CM

## 2014-06-05 LAB — POCT I-STAT CREATININE: Creatinine, Ser: 1.1 mg/dL (ref 0.61–1.24)

## 2014-06-05 MED ORDER — GADOBENATE DIMEGLUMINE 529 MG/ML IV SOLN
20.0000 mL | Freq: Once | INTRAVENOUS | Status: AC | PRN
  Administered 2014-06-05: 16 mL via INTRAVENOUS

## 2014-06-09 ENCOUNTER — Telehealth: Payer: Self-pay | Admitting: Neurology

## 2014-06-09 NOTE — Telephone Encounter (Signed)
Relayed message to Dr. Brett Fairy. Dr. Berle Mull office faxed [place] the needed imaging results. Dr. Brett Fairy said she did not need to talk to Dr. Ernesto Rutherford.

## 2014-06-09 NOTE — Telephone Encounter (Signed)
Dr Crossley(ENT) called to speak with Dr Brett Fairy. Cyril Mourning said to take a message.  Dr Ernesto Rutherford asked to be called on cell @ [phone].

## 2014-06-10 NOTE — Telephone Encounter (Signed)
Gave Dr. Brett Fairy Dr. Mackey Birchwood' number and asked her to call him.

## 2014-06-10 NOTE — Telephone Encounter (Signed)
Patient called and stated that Dr. Ernesto Rutherford needs a health clearance from Dr. Brett Fairy before he removes a cyst from the patients ear. Please call and advise.

## 2014-06-11 ENCOUNTER — Ambulatory Visit
Admission: RE | Admit: 2014-06-11 | Discharge: 2014-06-11 | Disposition: A | Discharge status: Home / Self Care | Payer: Medicare Other | Source: Ambulatory Visit | Attending: Neurology | Admitting: Neurology

## 2014-06-11 DIAGNOSIS — G4733 Obstructive sleep apnea (adult) (pediatric): Secondary | ICD-10-CM | POA: Diagnosis not present

## 2014-06-11 DIAGNOSIS — M4802 Spinal stenosis, cervical region: Secondary | ICD-10-CM

## 2014-06-11 DIAGNOSIS — H6523 Chronic serous otitis media, bilateral: Secondary | ICD-10-CM | POA: Diagnosis not present

## 2014-06-11 DIAGNOSIS — Z9989 Dependence on other enabling machines and devices: Principal | ICD-10-CM

## 2014-06-11 DIAGNOSIS — M4806 Spinal stenosis, lumbar region: Secondary | ICD-10-CM | POA: Diagnosis not present

## 2014-06-11 DIAGNOSIS — M48062 Spinal stenosis, lumbar region with neurogenic claudication: Secondary | ICD-10-CM

## 2014-06-17 ENCOUNTER — Telehealth: Payer: Self-pay | Admitting: Neurology

## 2014-06-17 NOTE — Telephone Encounter (Signed)
Patient called requesting to speak with the nurse regarding the results to him MRI. Please call and advise.

## 2014-06-18 DIAGNOSIS — I10 Essential (primary) hypertension: Secondary | ICD-10-CM | POA: Diagnosis not present

## 2014-06-18 DIAGNOSIS — R972 Elevated prostate specific antigen [PSA]: Secondary | ICD-10-CM | POA: Diagnosis not present

## 2014-06-18 DIAGNOSIS — E780 Pure hypercholesterolemia: Secondary | ICD-10-CM | POA: Diagnosis not present

## 2014-06-18 DIAGNOSIS — G609 Hereditary and idiopathic neuropathy, unspecified: Secondary | ICD-10-CM | POA: Diagnosis not present

## 2014-06-18 DIAGNOSIS — E1165 Type 2 diabetes mellitus with hyperglycemia: Secondary | ICD-10-CM | POA: Diagnosis not present

## 2014-06-18 NOTE — Telephone Encounter (Signed)
Patient returned the call, informed him that [facility] would call him when MRI results were available.

## 2014-06-18 NOTE — Telephone Encounter (Signed)
Returned pt's phone call. His MRI results are not available yet. I will call them when they are. No answer, left message asking him to call me back.

## 2014-06-18 NOTE — Telephone Encounter (Signed)
Error

## 2014-06-24 ENCOUNTER — Other Ambulatory Visit: Payer: Self-pay | Admitting: Otolaryngology

## 2014-06-24 DIAGNOSIS — R51 Headache: Principal | ICD-10-CM

## 2014-06-24 DIAGNOSIS — R0981 Nasal congestion: Secondary | ICD-10-CM

## 2014-06-24 DIAGNOSIS — R519 Headache, unspecified: Secondary | ICD-10-CM

## 2014-06-26 ENCOUNTER — Ambulatory Visit
Admission: RE | Admit: 2014-06-26 | Discharge: 2014-06-26 | Disposition: A | Discharge status: Home / Self Care | Payer: Medicare Other | Source: Ambulatory Visit | Attending: Otolaryngology | Admitting: Otolaryngology

## 2014-06-26 DIAGNOSIS — R519 Headache, unspecified: Secondary | ICD-10-CM

## 2014-06-26 DIAGNOSIS — J323 Chronic sphenoidal sinusitis: Secondary | ICD-10-CM | POA: Diagnosis not present

## 2014-06-26 DIAGNOSIS — R0981 Nasal congestion: Secondary | ICD-10-CM

## 2014-06-26 DIAGNOSIS — R51 Headache: Principal | ICD-10-CM

## 2014-06-30 ENCOUNTER — Telehealth: Payer: Self-pay

## 2014-06-30 NOTE — Telephone Encounter (Signed)
-----   Message from Larey Seat, MD sent at 06/29/2014  4:36 PM EDT ----- Multilevel fusion. Bone spurring, no change in comparison to CT from 2010.

## 2014-06-30 NOTE — Telephone Encounter (Signed)
Gave pt results of MRI. Pt verbalized understanding.

## 2014-07-06 DIAGNOSIS — J342 Deviated nasal septum: Secondary | ICD-10-CM | POA: Diagnosis not present

## 2014-07-06 DIAGNOSIS — J329 Chronic sinusitis, unspecified: Secondary | ICD-10-CM | POA: Diagnosis not present

## 2014-07-27 DIAGNOSIS — R079 Chest pain, unspecified: Secondary | ICD-10-CM | POA: Diagnosis not present

## 2014-07-27 DIAGNOSIS — N62 Hypertrophy of breast: Secondary | ICD-10-CM | POA: Diagnosis not present

## 2014-07-27 DIAGNOSIS — M25559 Pain in unspecified hip: Secondary | ICD-10-CM | POA: Diagnosis not present

## 2014-07-30 DIAGNOSIS — R972 Elevated prostate specific antigen [PSA]: Secondary | ICD-10-CM | POA: Diagnosis not present

## 2014-08-12 DIAGNOSIS — E119 Type 2 diabetes mellitus without complications: Secondary | ICD-10-CM | POA: Diagnosis not present

## 2014-08-17 DIAGNOSIS — J329 Chronic sinusitis, unspecified: Secondary | ICD-10-CM | POA: Diagnosis not present

## 2014-08-20 DIAGNOSIS — E1051 Type 1 diabetes mellitus with diabetic peripheral angiopathy without gangrene: Secondary | ICD-10-CM | POA: Diagnosis not present

## 2014-08-20 DIAGNOSIS — L603 Nail dystrophy: Secondary | ICD-10-CM | POA: Diagnosis not present

## 2014-08-20 DIAGNOSIS — I739 Peripheral vascular disease, unspecified: Secondary | ICD-10-CM | POA: Diagnosis not present

## 2014-09-01 DIAGNOSIS — J323 Chronic sphenoidal sinusitis: Secondary | ICD-10-CM | POA: Diagnosis not present

## 2014-09-01 DIAGNOSIS — J310 Chronic rhinitis: Secondary | ICD-10-CM | POA: Diagnosis not present

## 2014-09-01 DIAGNOSIS — J343 Hypertrophy of nasal turbinates: Secondary | ICD-10-CM | POA: Diagnosis not present

## 2014-09-01 DIAGNOSIS — Z9989 Dependence on other enabling machines and devices: Secondary | ICD-10-CM | POA: Diagnosis not present

## 2014-09-01 DIAGNOSIS — G4733 Obstructive sleep apnea (adult) (pediatric): Secondary | ICD-10-CM | POA: Diagnosis not present

## 2014-09-01 DIAGNOSIS — J322 Chronic ethmoidal sinusitis: Secondary | ICD-10-CM | POA: Diagnosis not present

## 2014-09-01 DIAGNOSIS — J342 Deviated nasal septum: Secondary | ICD-10-CM | POA: Diagnosis not present

## 2014-09-09 ENCOUNTER — Telehealth: Payer: Self-pay

## 2014-09-09 NOTE — Telephone Encounter (Signed)
Pt came by office today wanting to be seen by Dr. Brett Fairy for  L arm swelling. He reported that his L arm and hand swell in the evenings. The pt denied having seen his PCP for this problem. I advised pt that I would ask Dr. Brett Fairy if she would see him and call him back. I spoke to Dr. Brett Fairy and she wants me to advise pt to see his PCP, Dr. Lona Kettle since she doesn't think this is a neurological problem.  I called and left a message at pt's home number asking him to give me a call back.  I also called and spoke to Dr. Harrington Challenger at [facility] appointments department and asked them to please call and schedule the pt for L arm swelling.

## 2014-09-10 DIAGNOSIS — M25512 Pain in left shoulder: Secondary | ICD-10-CM | POA: Diagnosis not present

## 2014-09-10 DIAGNOSIS — M1990 Unspecified osteoarthritis, unspecified site: Secondary | ICD-10-CM | POA: Diagnosis not present

## 2014-09-10 DIAGNOSIS — M542 Cervicalgia: Secondary | ICD-10-CM | POA: Diagnosis not present

## 2014-09-10 DIAGNOSIS — M5412 Radiculopathy, cervical region: Secondary | ICD-10-CM | POA: Diagnosis not present

## 2014-09-10 DIAGNOSIS — R609 Edema, unspecified: Secondary | ICD-10-CM | POA: Diagnosis not present

## 2014-09-10 DIAGNOSIS — M65312 Trigger thumb, left thumb: Secondary | ICD-10-CM | POA: Diagnosis not present

## 2014-09-10 DIAGNOSIS — G629 Polyneuropathy, unspecified: Secondary | ICD-10-CM | POA: Diagnosis not present

## 2014-09-10 DIAGNOSIS — M5417 Radiculopathy, lumbosacral region: Secondary | ICD-10-CM | POA: Diagnosis not present

## 2014-10-02 DIAGNOSIS — M67912 Unspecified disorder of synovium and tendon, left shoulder: Secondary | ICD-10-CM | POA: Diagnosis not present

## 2014-10-02 DIAGNOSIS — M4722 Other spondylosis with radiculopathy, cervical region: Secondary | ICD-10-CM | POA: Diagnosis not present

## 2014-10-09 DIAGNOSIS — M65332 Trigger finger, left middle finger: Secondary | ICD-10-CM | POA: Diagnosis not present

## 2014-10-09 DIAGNOSIS — M65331 Trigger finger, right middle finger: Secondary | ICD-10-CM | POA: Diagnosis not present

## 2014-10-09 DIAGNOSIS — M79641 Pain in right hand: Secondary | ICD-10-CM | POA: Diagnosis not present

## 2014-10-13 DIAGNOSIS — M65332 Trigger finger, left middle finger: Secondary | ICD-10-CM | POA: Diagnosis not present

## 2014-10-13 DIAGNOSIS — M65331 Trigger finger, right middle finger: Secondary | ICD-10-CM | POA: Diagnosis not present

## 2014-10-19 DIAGNOSIS — M65332 Trigger finger, left middle finger: Secondary | ICD-10-CM | POA: Diagnosis not present

## 2014-10-19 DIAGNOSIS — M65331 Trigger finger, right middle finger: Secondary | ICD-10-CM | POA: Diagnosis not present

## 2014-10-22 DIAGNOSIS — G609 Hereditary and idiopathic neuropathy, unspecified: Secondary | ICD-10-CM | POA: Diagnosis not present

## 2014-10-22 DIAGNOSIS — E7800 Pure hypercholesterolemia, unspecified: Secondary | ICD-10-CM | POA: Diagnosis not present

## 2014-10-22 DIAGNOSIS — E1165 Type 2 diabetes mellitus with hyperglycemia: Secondary | ICD-10-CM | POA: Diagnosis not present

## 2014-10-22 DIAGNOSIS — I10 Essential (primary) hypertension: Secondary | ICD-10-CM | POA: Diagnosis not present

## 2014-10-28 DIAGNOSIS — G5601 Carpal tunnel syndrome, right upper limb: Secondary | ICD-10-CM | POA: Diagnosis not present

## 2014-10-30 DIAGNOSIS — G5601 Carpal tunnel syndrome, right upper limb: Secondary | ICD-10-CM | POA: Diagnosis not present

## 2014-10-30 DIAGNOSIS — G5602 Carpal tunnel syndrome, left upper limb: Secondary | ICD-10-CM | POA: Diagnosis not present

## 2014-10-30 DIAGNOSIS — M67912 Unspecified disorder of synovium and tendon, left shoulder: Secondary | ICD-10-CM | POA: Diagnosis not present

## 2014-11-03 ENCOUNTER — Emergency Department (HOSPITAL_COMMUNITY): Payer: Medicare Other

## 2014-11-03 ENCOUNTER — Inpatient Hospital Stay (HOSPITAL_COMMUNITY)
Admission: EM | Admit: 2014-11-03 | Discharge: 2014-11-05 | DRG: 069 | Disposition: A | Discharge status: Home / Self Care | Payer: Medicare Other | Attending: Family Medicine | Admitting: Family Medicine

## 2014-11-03 ENCOUNTER — Encounter (HOSPITAL_COMMUNITY): Payer: Self-pay | Admitting: Emergency Medicine

## 2014-11-03 ENCOUNTER — Other Ambulatory Visit: Payer: Self-pay | Admitting: Orthopedic Surgery

## 2014-11-03 DIAGNOSIS — Z86718 Personal history of other venous thrombosis and embolism: Secondary | ICD-10-CM | POA: Diagnosis not present

## 2014-11-03 DIAGNOSIS — I10 Essential (primary) hypertension: Secondary | ICD-10-CM | POA: Diagnosis present

## 2014-11-03 DIAGNOSIS — I6789 Other cerebrovascular disease: Secondary | ICD-10-CM | POA: Diagnosis not present

## 2014-11-03 DIAGNOSIS — G458 Other transient cerebral ischemic attacks and related syndromes: Secondary | ICD-10-CM

## 2014-11-03 DIAGNOSIS — Z955 Presence of coronary angioplasty implant and graft: Secondary | ICD-10-CM

## 2014-11-03 DIAGNOSIS — B009 Herpesviral infection, unspecified: Secondary | ICD-10-CM | POA: Diagnosis present

## 2014-11-03 DIAGNOSIS — E1140 Type 2 diabetes mellitus with diabetic neuropathy, unspecified: Secondary | ICD-10-CM | POA: Diagnosis present

## 2014-11-03 DIAGNOSIS — M75102 Unspecified rotator cuff tear or rupture of left shoulder, not specified as traumatic: Secondary | ICD-10-CM

## 2014-11-03 DIAGNOSIS — M6281 Muscle weakness (generalized): Secondary | ICD-10-CM | POA: Diagnosis not present

## 2014-11-03 DIAGNOSIS — I639 Cerebral infarction, unspecified: Secondary | ICD-10-CM | POA: Diagnosis not present

## 2014-11-03 DIAGNOSIS — Z794 Long term (current) use of insulin: Secondary | ICD-10-CM

## 2014-11-03 DIAGNOSIS — R531 Weakness: Secondary | ICD-10-CM | POA: Diagnosis not present

## 2014-11-03 DIAGNOSIS — R29898 Other symptoms and signs involving the musculoskeletal system: Secondary | ICD-10-CM

## 2014-11-03 DIAGNOSIS — Z86711 Personal history of pulmonary embolism: Secondary | ICD-10-CM

## 2014-11-03 DIAGNOSIS — I69359 Hemiplegia and hemiparesis following cerebral infarction affecting unspecified side: Secondary | ICD-10-CM | POA: Diagnosis not present

## 2014-11-03 DIAGNOSIS — G4733 Obstructive sleep apnea (adult) (pediatric): Secondary | ICD-10-CM | POA: Diagnosis present

## 2014-11-03 DIAGNOSIS — G459 Transient cerebral ischemic attack, unspecified: Secondary | ICD-10-CM | POA: Diagnosis not present

## 2014-11-03 DIAGNOSIS — Z7902 Long term (current) use of antithrombotics/antiplatelets: Secondary | ICD-10-CM

## 2014-11-03 DIAGNOSIS — N400 Enlarged prostate without lower urinary tract symptoms: Secondary | ICD-10-CM | POA: Diagnosis present

## 2014-11-03 DIAGNOSIS — K219 Gastro-esophageal reflux disease without esophagitis: Secondary | ICD-10-CM | POA: Diagnosis present

## 2014-11-03 DIAGNOSIS — I2510 Atherosclerotic heart disease of native coronary artery without angina pectoris: Secondary | ICD-10-CM | POA: Diagnosis present

## 2014-11-03 DIAGNOSIS — G452 Multiple and bilateral precerebral artery syndromes: Secondary | ICD-10-CM | POA: Diagnosis not present

## 2014-11-03 LAB — I-STAT TROPONIN, ED: Troponin i, poc: 0 ng/mL (ref 0.00–0.08)

## 2014-11-03 LAB — COMPREHENSIVE METABOLIC PANEL
ALT: 21 U/L (ref 17–63)
AST: 22 U/L (ref 15–41)
Albumin: 3.2 g/dL — ABNORMAL LOW (ref 3.5–5.0)
Alkaline Phosphatase: 63 U/L (ref 38–126)
Anion gap: 5 (ref 5–15)
BUN: 12 mg/dL (ref 6–20)
CO2: 28 mmol/L (ref 22–32)
Calcium: 9.2 mg/dL (ref 8.9–10.3)
Chloride: 105 mmol/L (ref 101–111)
Creatinine, Ser: 1.06 mg/dL (ref 0.61–1.24)
GFR calc Af Amer: >60 mL/min (ref >60)
GFR calc non Af Amer: >60 mL/min (ref >60)
Glucose, Bld: 222 mg/dL — ABNORMAL HIGH (ref 65–99)
Potassium: 4.5 mmol/L (ref 3.5–5.1)
Sodium: 138 mmol/L (ref 135–145)
Total Bilirubin: 0.5 mg/dL (ref 0.3–1.2)
Total Protein: 5.9 g/dL — ABNORMAL LOW (ref 6.5–8.1)

## 2014-11-03 LAB — URINALYSIS, ROUTINE W REFLEX MICROSCOPIC
Bilirubin Urine: NEGATIVE
Glucose, UA: >1000 mg/dL — AB
Hgb urine dipstick: NEGATIVE
Ketones, ur: NEGATIVE mg/dL
Leukocytes, UA: NEGATIVE
Nitrite: NEGATIVE
Protein, ur: NEGATIVE mg/dL
Specific Gravity, Urine: 1.016 (ref 1.005–1.030)
Urobilinogen, UA: 0.2 mg/dL (ref 0.0–1.0)
pH: 6 (ref 5.0–8.0)

## 2014-11-03 LAB — CBC
HCT: 37 % — ABNORMAL LOW (ref 39.0–52.0)
Hemoglobin: 12.7 g/dL — ABNORMAL LOW (ref 13.0–17.0)
MCH: 30.5 pg (ref 26.0–34.0)
MCHC: 34.3 g/dL (ref 30.0–36.0)
MCV: 88.9 fL (ref 78.0–100.0)
Platelets: 196 10*3/uL (ref 150–400)
RBC: 4.16 MIL/uL — ABNORMAL LOW (ref 4.22–5.81)
RDW: 12.8 % (ref 11.5–15.5)
WBC: 4.7 10*3/uL (ref 4.0–10.5)

## 2014-11-03 LAB — ETHANOL: Alcohol, Ethyl (B): <5 mg/dL (ref <5)

## 2014-11-03 LAB — RAPID URINE DRUG SCREEN, HOSP PERFORMED
Amphetamines: NOT DETECTED
Barbiturates: NOT DETECTED
Benzodiazepines: NOT DETECTED
Cocaine: NOT DETECTED
Opiates: NOT DETECTED
Tetrahydrocannabinol: NOT DETECTED

## 2014-11-03 LAB — DIFFERENTIAL
Basophils Absolute: 0 10*3/uL (ref 0.0–0.1)
Basophils Relative: 0 %
Eosinophils Absolute: 0 10*3/uL (ref 0.0–0.7)
Eosinophils Relative: 1 %
Lymphocytes Relative: 49 %
Lymphs Abs: 2.3 10*3/uL (ref 0.7–4.0)
Monocytes Absolute: 0.4 10*3/uL (ref 0.1–1.0)
Monocytes Relative: 8 %
Neutro Abs: 2 10*3/uL (ref 1.7–7.7)
Neutrophils Relative %: 42 %

## 2014-11-03 LAB — URINE MICROSCOPIC-ADD ON

## 2014-11-03 LAB — APTT: aPTT: 31 seconds (ref 24–37)

## 2014-11-03 LAB — GLUCOSE, CAPILLARY: Glucose-Capillary: 176 mg/dL — ABNORMAL HIGH (ref 65–99)

## 2014-11-03 LAB — PROTIME-INR
INR: 1.36 (ref 0.00–1.49)
Prothrombin Time: 16.9 seconds — ABNORMAL HIGH (ref 11.6–15.2)

## 2014-11-03 MED ORDER — RIVAROXABAN 20 MG PO TABS: 20.0000 mg | ORAL_TABLET | Freq: Every day | ORAL | Status: DC

## 2014-11-03 MED ORDER — ASPIRIN 81 MG PO CHEW
81.0000 mg | CHEWABLE_TABLET | Freq: Every day | ORAL | Status: DC
  Administered 2014-11-03 – 2014-11-05 (×3): 81 mg via ORAL
  Filled 2014-11-03 (×3): qty 1

## 2014-11-03 MED ORDER — ROSUVASTATIN CALCIUM 10 MG PO TABS: 10.0000 mg | ORAL_TABLET | Freq: Every evening | ORAL | Status: DC

## 2014-11-03 MED ORDER — PANTOPRAZOLE SODIUM 40 MG PO TBEC
40.0000 mg | DELAYED_RELEASE_TABLET | Freq: Every day | ORAL | Status: DC
  Administered 2014-11-04 – 2014-11-05 (×3): 40 mg via ORAL
  Filled 2014-11-03 (×3): qty 1

## 2014-11-03 MED ORDER — METFORMIN HCL 500 MG PO TABS
500.0000 mg | ORAL_TABLET | Freq: Two times a day (BID) | ORAL | Status: DC
  Administered 2014-11-04 – 2014-11-05 (×3): 500 mg via ORAL
  Filled 2014-11-03 (×3): qty 1

## 2014-11-03 MED ORDER — FUROSEMIDE 20 MG PO TABS: 20.0000 mg | ORAL_TABLET | Freq: Every day | ORAL | Status: DC | PRN

## 2014-11-03 MED ORDER — RIVAROXABAN 20 MG PO TABS
20.0000 mg | ORAL_TABLET | Freq: Every day | ORAL | Status: DC
  Administered 2014-11-04 – 2014-11-05 (×2): 20 mg via ORAL
  Filled 2014-11-03 (×2): qty 1

## 2014-11-03 MED ORDER — DUTASTERIDE 0.5 MG PO CAPS: 0.5000 mg | ORAL_CAPSULE | Freq: Every day | ORAL | Status: DC

## 2014-11-03 MED ORDER — SENNOSIDES-DOCUSATE SODIUM 8.6-50 MG PO TABS: 1.0000 | ORAL_TABLET | Freq: Every evening | ORAL | Status: DC | PRN

## 2014-11-03 MED ORDER — REPAGLINIDE 2 MG PO TABS
2.0000 mg | ORAL_TABLET | Freq: Three times a day (TID) | ORAL | Status: DC
  Administered 2014-11-04: 2 mg via ORAL
  Filled 2014-11-03 (×7): qty 1

## 2014-11-03 MED ORDER — INSULIN ASPART 100 UNIT/ML ~~LOC~~ SOLN
0.0000 [IU] | Freq: Three times a day (TID) | SUBCUTANEOUS | Status: DC
  Administered 2014-11-04: 3 [IU] via SUBCUTANEOUS
  Administered 2014-11-04: 2 [IU] via SUBCUTANEOUS
  Administered 2014-11-05 (×2): 3 [IU] via SUBCUTANEOUS

## 2014-11-03 MED ORDER — ROSUVASTATIN CALCIUM 10 MG PO TABS
10.0000 mg | ORAL_TABLET | Freq: Every evening | ORAL | Status: DC
  Administered 2014-11-04: 10 mg via ORAL
  Filled 2014-11-03 (×2): qty 1

## 2014-11-03 MED ORDER — REPAGLINIDE 2 MG PO TABS
2.0000 mg | ORAL_TABLET | Freq: Three times a day (TID) | ORAL | Status: DC
  Filled 2014-11-03: qty 1

## 2014-11-03 MED ORDER — STROKE: EARLY STAGES OF RECOVERY BOOK
Freq: Once | Status: AC
  Administered 2014-11-03: 1

## 2014-11-03 MED ORDER — DUTASTERIDE 0.5 MG PO CAPS
0.5000 mg | ORAL_CAPSULE | Freq: Every day | ORAL | Status: DC
  Administered 2014-11-04 – 2014-11-05 (×3): 0.5 mg via ORAL
  Filled 2014-11-03 (×3): qty 1

## 2014-11-03 MED ORDER — PREGABALIN 75 MG PO CAPS
300.0000 mg | ORAL_CAPSULE | Freq: Two times a day (BID) | ORAL | Status: DC
  Administered 2014-11-03 – 2014-11-05 (×4): 300 mg via ORAL
  Filled 2014-11-03 (×4): qty 4

## 2014-11-03 MED ORDER — INSULIN ASPART PROT & ASPART (70-30 MIX) 100 UNIT/ML ~~LOC~~ SUSP
18.0000 [IU] | Freq: Two times a day (BID) | SUBCUTANEOUS | Status: DC
  Administered 2014-11-04 – 2014-11-05 (×2): 18 [IU] via SUBCUTANEOUS
  Filled 2014-11-03: qty 10

## 2014-11-03 MED ORDER — VALACYCLOVIR HCL 500 MG PO TABS
500.0000 mg | ORAL_TABLET | Freq: Every day | ORAL | Status: DC
  Administered 2014-11-04 – 2014-11-05 (×2): 500 mg via ORAL
  Filled 2014-11-03 (×2): qty 1

## 2014-11-03 MED ORDER — VALACYCLOVIR HCL 500 MG PO TABS: 500.0000 mg | ORAL_TABLET | Freq: Every day | ORAL | Status: DC

## 2014-11-03 NOTE — H&P (Signed)
History and Physical  Erhard Senske JKK:[id number] DOB: 03-12-1940 DOA: 11/03/2014  PCP:  Melinda Crutch, MD   Chief Complaint: Left arm heaviness and dizziness.   History of Present Illness:  Patient is a 74 yo male with history of CVA s/p bilateral weakness R>L, DMII, CAD s/p stent placement in 2010, HTN, BPH, and DVT on Xarelto who came with cc of left arm weakness and heaviness that started this morning around 6 am and was resolved by the afternoon around 2 pm. This was associated with mild slurred speech that was resolved within couple of hours with no other symptoms of sensory deficits or motor weakness in other extremities or facial droop,or dysphagia. He started feeling a little dizzy last night and woke up this morning feeling a little " shaky" before he started having those symptoms. He has no other complaints and his symptoms are completely resolved now as he stated.   Review of Systems:  CONSTITUTIONAL:  No night sweats.  No fatigue, malaise, lethargy.  No fever or chills. Eyes:  No visual changes.  No eye pain.  No eye discharge.   ENT:    No epistaxis.  No sinus pain.  No sore throat.  No ear pain.  No congestion. RESPIRATORY:  No cough.  No wheeze.  No hemoptysis.  No shortness of breath. CARDIOVASCULAR:  No chest pains.  No palpitations. GASTROINTESTINAL:  No abdominal pain.  No nausea or vomiting.  No diarrhea or constipation.  No hematemesis.  No hematochezia.  No melena. GENITOURINARY:  No urgency.  No frequency.  No dysuria.  No hematuria.  No obstructive symptoms.  No discharge.  No pain.  No significant abnormal bleeding. MUSCULOSKELETAL:  No musculoskeletal pain.  No joint swelling.  No arthritis. NEUROLOGICAL:  No confusion.  Weakness in both RUE and LUE.  PSYCHIATRIC:  No depression. No anxiety. No suicidal ideation. SKIN:  No rashes.  No lesions.  No wounds. ENDOCRINE:  No unexplained weight loss.  No polydipsia.  No polyuria.  No polyphagia. HEMATOLOGIC:   No anemia.  No purpura.  No petechiae.  No bleeding.  ALLERGIC AND IMMUNOLOGIC:  No pruritus.  No swelling Other:  Past Medical and Surgical History:   Past Medical History  Diagnosis Date  . Diabetes mellitus   . Coronary artery disease   . Hypertension   . History of TIAs   . Lumbago   . Benign localized hyperplasia of prostate without urinary obstruction and other lower urinary tract symptoms (LUTS)   . Other and unspecified hyperlipidemia   . Unspecified essential hypertension   . Type II or unspecified type diabetes mellitus without mention of complication, not stated as uncontrolled   . Personal history of unspecified circulatory disease   . Unspecified fall   . Hypersomnia with sleep apnea, unspecified   . Pain in joint, multiple sites   . Degeneration of cervical intervertebral disc   . Unspecified cardiovascular disease   . Unsteady gait 05/19/2013    X 24 hours - improving. Hx prior CVA/TIAs; multiple risk factors; on Xarelto  . Stroke [facility]) 2002, 2003    both sided weakness  . Trigger finger of both hands 11-17-13   Past Surgical History  Procedure Laterality Date  . Hernia repair    . Carpal tunnel release      bilateral  . Elbow surgery      right elbow - nerve release  . Cervical disc surgery    . Lungs      "fluid  pumped off lungs"  . Coronary stent placement  Feb. 2010  . Cardiac catheterization    . Coronary angioplasty    . Left heart catheterization with coronary angiogram N/A 04/03/2013    Procedure: LEFT HEART CATHETERIZATION WITH CORONARY ANGIOGRAM;  Surgeon: Jettie Booze, MD;  Location: [facility] CATH LAB;  Service: Cardiovascular;  Laterality: N/A;    Social History:   reports that he has never smoked. He has never used smokeless tobacco. He reports that he does not drink alcohol or use illicit drugs.  Allergies  Allergen Reactions  . Phenergan [Promethazine] Other (See Comments)    Mood changes     Family History  Problem Relation Age of  Onset  . Aneurysm Mother   . Cancer Father      Prior to Admission medications   Medication Sig Start Date End Date Taking? Authorizing Provider  Ascorbic Acid (VITAMIN C PO) Take 1 tablet by mouth daily.   Yes Historical Provider, MD  chlorhexidine (PERIDEX) 0.12 % solution Use as directed 15 mLs in the mouth or throat 2 (two) times daily.  07/25/13  Yes Historical Provider, MD  cholecalciferol (VITAMIN D) 1000 UNITS tablet Take 1,000 Units by mouth daily.   Yes Historical Provider, MD  diclofenac sodium (VOLTAREN) 1 % GEL Apply 2 g topically 3 (three) times daily as needed (pain).   Yes Historical Provider, MD  dutasteride (AVODART) 0.5 MG capsule Take 0.5 mg by mouth daily.   Yes Historical Provider, MD  furosemide (LASIX) 20 MG tablet Take 20 mg by mouth daily as needed (swelling).    Yes Historical Provider, MD  HUMALOG MIX 75/25 KWIKPEN (75-25) 100 UNIT/ML Kwikpen Inject into the skin. 20 units in am, 18 units in pm. 10/21/13  Yes Historical Provider, MD  metFORMIN (GLUCOPHAGE) 500 MG tablet Take 2 tablets (1,000 mg total) by mouth 2 (two) times daily with a meal. Patient taking differently: Take 500 mg by mouth 2 (two) times daily with a meal.  04/05/13  Yes Jettie Booze, MD  Multiple Vitamin (MULTIVITAMIN WITH MINERALS) TABS Take 1 tablet by mouth every evening.    Yes Historical Provider, MD  pantoprazole (PROTONIX) 40 MG tablet Take 40 mg by mouth daily.   Yes Historical Provider, MD  pregabalin (LYRICA) 300 MG capsule Take 1 capsule (300 mg total) by mouth 2 (two) times daily. 07/26/12  Yes Carmen Dohmeier, MD  ramipril (ALTACE) 1.25 MG capsule Take 1.25 mg by mouth daily.  10/13/13  Yes Historical Provider, MD  ramipril (ALTACE) 2.5 MG capsule Take 1.25 mg by mouth every evening.    Yes Historical Provider, MD  Rivaroxaban (XARELTO) 20 MG TABS tablet Take 1 tablet (20 mg total) by mouth daily with supper. Patient taking differently: Take 20 mg by mouth daily.  04/05/13  Yes  Jettie Booze, MD  rosuvastatin (CRESTOR) 10 MG tablet Take 10 mg by mouth every evening.    Yes Historical Provider, MD  valACYclovir (VALTREX) 500 MG tablet Take 500 mg by mouth daily.   Yes Historical Provider, MD  BD PEN NEEDLE NANO U/F 32G X 4 MM MISC  09/01/13   Historical Provider, MD  DULoxetine (CYMBALTA) 60 MG capsule Take 60 mg by mouth daily.    Historical Provider, MD  ferrous sulfate 325 (65 FE) MG tablet Take 325 mg by mouth daily with breakfast.    Historical Provider, MD  fluticasone (FLONASE) 50 MCG/ACT nasal spray Place 2 sprays into both nostrils daily as needed for  allergies.  05/30/13   Historical Provider, MD  lactobacillus acidophilus & bulgar (LACTINEX) chewable tablet Chew 1 tablet by mouth 2 (two) times daily. 05/19/14   Historical Provider, MD  nitroGLYCERIN (NITROSTAT) 0.4 MG SL tablet Place 1 tablet (0.4 mg total) under the tongue every 5 (five) minutes as needed for chest pain. 04/03/13   Jettie Booze, MD  nystatin (MYCOSTATIN) 100000 UNIT/ML suspension  10/09/13   Historical Provider, MD  prednisoLONE acetate (PRED FORTE) 1 % ophthalmic suspension Place 1 drop into both eyes as needed.  09/05/13   Historical Provider, MD  repaglinide (PRANDIN) 2 MG tablet Take 2 mg by mouth 3 (three) times daily before meals.    Historical Provider, MD    Physical Exam: BP 155/89 mmHg  Pulse 53  Temp(Src) 98.2 F (36.8 C) (Oral)  Resp 16  SpO2 98%  GENERAL : Well developed, well nourished, alert and cooperative, and appears to be in no acute distress. HEAD: normocephalic. EYES: PERRL, EOMI. Fundi normal, vision is grossly intact. EARS: hearing grossly intact. NOSE: No nasal discharge. THROAT: Oral cavity and pharynx normal.   NECK: Neck supple,. CARDIAC: Normal S1 and S2. No S3, S4 or murmurs. Rhythm is regular. LUNGS: Clear to auscultation and percussion without rales, rhonchi, wheezing or diminished breath sounds. ABDOMEN: Positive bowel sounds. Soft,  nondistended, nontender. No guarding or rebound. No masses. EXTREMITIES: No significant deformity or joint abnormality. NEUROLOGICAL: The mental examination revealed the patient was oriented to person, place, and time.CN II-XII intact. Strength and sensation asymmetric with right arm 4/5 and left 5/5 ( residual from prior stroke). . Cerebellar testing normal. SKIN: Skin normal color, texture and turgor with no lesions or eruptions. PSYCHIATRIC:  The patient was able to demonstrate good judgement and reason, without hallucinations, abnormal affect or abnormal behaviors during the examination. Patient is not suicidal.          Labs on Admission:  Reviewed.   Radiological Exams on Admission: Ct Head Wo Contrast  11/03/2014  CLINICAL DATA:  One day history of left arm weakness EXAM: CT HEAD WITHOUT CONTRAST TECHNIQUE: Contiguous axial images were obtained from the base of the skull through the vertex without intravenous contrast. COMPARISON:  Head CT May 20, 2013; brain MRI Jun 05, 2014 FINDINGS: Age related volume loss is stable. There is no intracranial mass, hemorrhage, extra-axial fluid collection, or midline shift. A prior tiny lacunar infarct in the posterior right corona radiata is less apparent at this time compared to the prior study. There is minimal periventricular small vessel disease elsewhere. There no acute infarct is evident. The bony calvarium appears intact. The mastoid air cells are clear. Extensive opacification in the right sphenoid sinus is again noted. There is also mild ethmoid sinus disease bilaterally. IMPRESSION: Age related volume loss. Prior tiny lacunar infarct in the right corona radiata posteriorly. Minimal periventricular small vessel disease. No intracranial mass, hemorrhage, or acute appearing infarct. Question mucocele in the right sphenoid sinus again noted. Mild ethmoid sinus disease bilaterally. Electronically Signed   By: Lowella Grip III M.D.   On: 11/03/2014  16:04     Assessment/Plan  transient ischemic attack: Resolved in < 12 hours Given asp in the ER. Will continue asp, statin and Xarelto as CT is neg for acute hemorrhagic infarcts.  PT/OT/speech in am Neuro in am: defer MRI till neuro see patient.  Recent Echo in 2015 : Left ventricle: The cavity size was normal. Wall thickness was normal. Systolic function was normal. The estimated  ejection fraction was in the range of 60% to 65%. Doppler parameters are consistent with abnormal left ventricular relaxation (grade 1 diastolic dysfunction).   Aortic valve: There was trivial regurgitation.   Pulmonary arteries: PA peak pressure: 33 mm Hg (S). - will check carotid US bilaterally. - holding BP meds : permissive BP < 180/90 - Admit to Tele   DMII:  Resume home meds with low dose correction  DVT:  Continue Xarelto  HTN: hold meds for now  CAD: continue asp and statin    DVT prophylaxis: on full dose AC GI prophylaxis: PPI Consultants: Neuro Code Status: Full    Gennaro Africa M.D [facility]

## 2014-11-03 NOTE — ED Notes (Signed)
Attempted report x1. 

## 2014-11-03 NOTE — ED Notes (Signed)
Dr. Freda Munro advised he did not want to call a code stroke on the patient.

## 2014-11-03 NOTE — ED Notes (Signed)
MD at bedside. 

## 2014-11-03 NOTE — ED Notes (Signed)
Pt arrives via [facility] for c/o left arm weakness that patient noticed this am at 0630 when he woke up. Ems reports no neuro deficits, equal grip strength bilaterally. Speech clear, resp e/u.

## 2014-11-03 NOTE — ED Notes (Signed)
Patient given an urinal to use 

## 2014-11-04 ENCOUNTER — Ambulatory Visit (HOSPITAL_COMMUNITY): Payer: Medicare Other

## 2014-11-04 ENCOUNTER — Inpatient Hospital Stay (HOSPITAL_COMMUNITY): Payer: Medicare Other

## 2014-11-04 DIAGNOSIS — R29898 Other symptoms and signs involving the musculoskeletal system: Secondary | ICD-10-CM

## 2014-11-04 DIAGNOSIS — G452 Multiple and bilateral precerebral artery syndromes: Secondary | ICD-10-CM

## 2014-11-04 LAB — GLUCOSE, CAPILLARY
Glucose-Capillary: 176 mg/dL — ABNORMAL HIGH (ref 65–99)
Glucose-Capillary: 225 mg/dL — ABNORMAL HIGH (ref 65–99)
Glucose-Capillary: 53 mg/dL — ABNORMAL LOW (ref 65–99)
Glucose-Capillary: 81 mg/dL (ref 65–99)
Glucose-Capillary: 238 mg/dL — ABNORMAL HIGH (ref 65–99)

## 2014-11-04 MED ORDER — RAMIPRIL 1.25 MG PO CAPS: 1.2500 mg | ORAL_CAPSULE | Freq: Every evening | ORAL | Status: DC

## 2014-11-04 MED ORDER — RAMIPRIL 1.25 MG PO CAPS
1.2500 mg | ORAL_CAPSULE | Freq: Every day | ORAL | Status: DC
  Administered 2014-11-04 – 2014-11-05 (×2): 1.25 mg via ORAL
  Filled 2014-11-04 (×2): qty 1

## 2014-11-04 NOTE — Progress Notes (Signed)
Steve Brown MIW:[id number] DOB: [birth year] DOA: 11/03/2014 PCP:  Melinda Crutch, MD  Brief narrative: 74 y/o ? Prior CVA x 2 2002/2003 TIA 2013 Neck surg 2004-has severe Lumbar stenosis and follows Pain management Dr. Vira Blanco DMt yy ii OSA on CPAP foll Dr. Brett Fairy Htn CAd c LAD stent 2012 Bilat PE 07/2012   Past medical history-As per Problem list Chart reviewed as below-   Consultants:  None yet  Procedures:  none  Antibiotics:  none   Subjective   Feels fair Relates had some dizzyness and L arm wekaness over the course of the past 24 hours No nausea no vomiting No chest pain No blurred vision or double vision no dysarthria    Objective    Interim History:   Telemetry: Sinus bradycardia   Objective: Filed Vitals:   11/04/14 0522 11/04/14 0800 11/04/14 0957 11/04/14 1000  BP: 118/57 147/78 131/67 122/64  Pulse: 60 58 60 61  Temp: 98.2 F (36.8 C) 98.4 F (36.9 C) 98 F (36.7 C) 97.5 F (36.4 C)  TempSrc: Oral Oral Oral Oral  Resp: 18 18 18 18   Height:      Weight:      SpO2: 97% 96% 100% 99%    Intake/Output Summary (Last 24 hours) at 11/04/14 1154 Last data filed at 11/04/14 0442  Gross per 24 hour  Intake      0 ml  Output    650 ml  Net   -650 ml    Exam:  General: EOMI NCAT tongue protrudes midline, smile symmetric Shrug symmetric, power 5/5 grossly Cardiovascular: S1-S2 no murmur rub or gallop Respiratory: Clinically clear no added sound Abdomen: Soft nontender nondistended no rebound Skin no lower extremity edema Neuro intact as above. Gait not assessed, reflexes 2/3, power 5/5 sensory grossly intact  Data Reviewed: Basic Metabolic Panel:  Recent Labs Lab 11/03/14 1525  NA 138  K 4.5  CL 105  CO2 28  GLUCOSE 222*  BUN 12  CREATININE 1.06  CALCIUM 9.2   Liver Function Tests:  Recent Labs Lab 11/03/14 1525  AST 22  ALT 21  ALKPHOS 63  BILITOT 0.5  PROT 5.9*  ALBUMIN 3.2*   No results for input(s):  LIPASE, AMYLASE in the last 168 hours. No results for input(s): AMMONIA in the last 168 hours. CBC:  Recent Labs Lab 11/03/14 1525  WBC 4.7  NEUTROABS 2.0  HGB 12.7*  HCT 37.0*  MCV 88.9  PLT 196   Cardiac Enzymes: No results for input(s): CKTOTAL, CKMB, CKMBINDEX, TROPONINI in the last 168 hours. BNP: Invalid input(s): POCBNP CBG:  Recent Labs Lab 11/03/14 2205 11/04/14 0654 11/04/14 1125  GLUCAP 176* 176* 225*    No results found for this or any previous visit (from the past 240 hour(s)).   Studies:              All Imaging reviewed and is as per above notation   Scheduled Meds: . aspirin  81 mg Oral Daily  . dutasteride  0.5 mg Oral Daily  . insulin aspart  0-9 Units Subcutaneous TID WC  . insulin aspart protamine- aspart  18 Units Subcutaneous BID AC  . metFORMIN  500 mg Oral BID WC  . pantoprazole  40 mg Oral Daily  . pregabalin  300 mg Oral BID  . repaglinide  2 mg Oral TID AC  . rivaroxaban  20 mg Oral Daily  . rosuvastatin  10 mg Oral QPM  . valACYclovir  500 mg Oral  Daily   Continuous Infusions:    Assessment/Plan:   1. Stroke versus TIA-ordered MRI brain. If shows any new finding we'll consult neurology. Already on Rivaroxiban 20 daily for PE which should continue. 2. OSA on CPAP-we will continue CPAP hair 3. Hypertension allow permissive hypertension but will restart home meds ramipril 1.25 daily, Lasix 40 daily 4. 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 Sliding scale insulin. Blood sugars ranging between 117-30. Continue 3 [place] in 300 twice a day 5. Prior history CAD 2012-see above discussion. Consider aspirin addition 6. Bilateral PE in 2014-continues Xarelto 20 od 7. Body mass index is 36.7 kg/(m^2).-weight loss strategies as an outpatient 8. Herpes-continue  valacyclovir 500 daily 9. Reflux continue pantoprazole 40 daily 10. BPH, continue dutasteride 0.50 daily    Presumed full code No family present Inpatient to workup complete  Verneita Griffes, MD  [facility] Pager [phone] 11/04/2014, 11:54 AM    LOS: 1 day

## 2014-11-04 NOTE — Evaluation (Signed)
Physical Therapy Evaluation Patient Details Name: Steve Brown MRN: [id number] DOB: Oct 04, 1940 Today's Date: 11/04/2014   History of Present Illness  Pt is a 74 y/o male with a PMH of CVA, CAD, HTN, DVT, LUE weakness s/p cervical surgery, and back pain with reported radiculopathy in LLE. Pt presents with LUE weakness and associated slurred speech that started morning of admission, which resolved around 2pm.   Clinical Impression  Pt admitted with above diagnosis. Pt currently with functional limitations due to the deficits listed below (see PT Problem List). At the time of PT eval pt was able to perform transfers and ambulation with modified independence to supervision for safety. Pt reports he is not at his baseline of function, and reports continued feeling of weakness in the L side. Pt will benefit from skilled PT to increase their independence and safety with mobility to allow discharge to the venue listed below.       Follow Up Recommendations Home health PT;Supervision - Intermittent    Equipment Recommendations  None recommended by PT    Recommendations for Other Services       Precautions / Restrictions Precautions Precautions: Fall Restrictions Weight Bearing Restrictions: No      Mobility  Bed Mobility Overal bed mobility: Needs Assistance Bed Mobility: Supine to Sit     Supine to sit: Modified independent (Device/Increase time)     General bed mobility comments: Increased time required. HOB flat and no use of rail.   Transfers Overall transfer level: Modified independent Equipment used: None             General transfer comment: Increased time to power-up to full standing position. No physical assist required.   Ambulation/Gait Ambulation/Gait assistance: Supervision Ambulation Distance (Feet): 500 Feet Assistive device: None Gait Pattern/deviations: Step-through pattern;Decreased stride length;Decreased weight shift to left Gait velocity:  Decreased Gait velocity interpretation: Below normal speed for age/gender General Gait Details: Supervision for safety. No physical assistance required. Cadence uneven and grossly slow compared to reported baseline.   Stairs Stairs: Yes Stairs assistance: Min guard Stair Management: One rail Right;Alternating pattern;Step to pattern;Forwards Number of Stairs: 11 General stair comments: VC's for step-to pattern for safety however pt was able to complete alternating step pattern well.   Wheelchair Mobility    Modified Rankin (Stroke Patients Only) Modified Rankin (Stroke Patients Only) Pre-Morbid Rankin Score: No symptoms Modified Rankin: Slight disability     Balance Overall balance assessment: No apparent balance deficits (not formally assessed)                                           Pertinent Vitals/Pain Pain Assessment: 0-10 Pain Score: 7  Pain Location: L hand and back Pain Descriptors / Indicators: Aching;Constant Pain Intervention(s): Limited activity within patient's tolerance;Monitored during session;Repositioned    Home Living Family/patient expects to be discharged to:: Private residence Living Arrangements: Alone Available Help at Discharge: Family;Friend(s);Available PRN/intermittently Type of Home: Apartment Home Access: Stairs to enter Entrance Stairs-Rails: Right Entrance Stairs-Number of Steps: 18 Home Layout: One level Home Equipment: Cane - single point;Grab bars - tub/shower      Prior Function Level of Independence: Independent         Comments: Driving, doing grocery shopping, community ambulator     Hand Dominance   Dominant Hand: Right    Extremity/Trunk Assessment   Upper Extremity Assessment: LUE deficits/detail  LUE Deficits / Details: Pt reports pain in his hand and feeling of weakness. MMT revealed only slight weakness when compared to R side.    Lower Extremity Assessment: LLE deficits/detail    LLE Deficits / Details: Pt reports radicular pain from back and feeling of weakness compared to the R side. MMT revealed no strength differences and R=L. Initially L quad weaker than R, however with retest L=R.   Cervical / Trunk Assessment: Normal  Communication   Communication: No difficulties  Cognition Arousal/Alertness: Awake/alert Behavior During Therapy: WFL for tasks assessed/performed Overall Cognitive Status: History of cognitive impairments - at baseline       Memory: Decreased short-term memory (Pt reports decreased STM at baseline)              General Comments      Exercises        Assessment/Plan    PT Assessment Patient needs continued PT services  PT Diagnosis Difficulty walking;Acute pain   PT Problem List Decreased strength;Decreased range of motion;Decreased activity tolerance;Decreased balance;Decreased mobility;Decreased knowledge of use of DME;Decreased safety awareness;Decreased knowledge of precautions;Pain  PT Treatment Interventions DME instruction;Gait training;Stair training;Functional mobility training;Therapeutic activities;Therapeutic exercise;Neuromuscular re-education;Patient/family education   PT Goals (Current goals can be found in the Care Plan section) Acute Rehab PT Goals Patient Stated Goal: Return to the gym PT Goal Formulation: With patient Time For Goal Achievement: 11/11/14 Potential to Achieve Goals: Good    Frequency Min 4X/week   Barriers to discharge Decreased caregiver support Pt lives alone    Co-evaluation               End of Session Equipment Utilized During Treatment: Gait belt Activity Tolerance: Patient tolerated treatment well Patient left: in chair;with call bell/phone within reach;with chair alarm set Nurse Communication: Mobility status         Time: 1127-1203 PT Time Calculation (min) (ACUTE ONLY): 36 min   Charges:   PT Evaluation $Initial PT Evaluation Tier I: 1 Procedure PT  Treatments $Gait Training: 8-22 mins   PT G Codes:        Rolinda Roan 11-18-2014, 12:34 PM   Rolinda Roan, PT, DPT Acute Rehabilitation Services Pager: [phone]

## 2014-11-04 NOTE — Progress Notes (Signed)
Pt meal delayed due to MRI. Pt states feels blood sugar dropping. Given orange juice and pt has begun meal. Declines any extra attempts to elevate blood sugar other than sugar packets in orange juice. Will recheck in 15 minutes. Wendee Copp

## 2014-11-04 NOTE — Care Management Note (Signed)
Case Management Note  Patient Details  Name: Abdiaziz Klahn MRN: [id number] Date of Birth: [birth year]  Subjective/Objective:                    Action/Plan: Patient admitted with TIA. CT of head negative and awaiting results of MRI. PT/OT recommending home health services. CM will continue to follow for discharge needs.   Expected Discharge Date:                  Expected Discharge Plan:  [place]  In-House Referral:     Discharge planning Services     Post Acute Care Choice:    Choice offered to:     DME Arranged:    DME Agency:     HH Arranged:    [place] Agency:     Status of Service:  In process, will continue to follow  Medicare Important Message Given:    Date Medicare IM Given:    Medicare IM give by:    Date Additional Medicare IM Given:    Additional Medicare Important Message give by:     If discussed at [place] of Stay Meetings, dates discussed:    Additional Comments:  Pollie Friar, RN 11/04/2014, 3:23 PM

## 2014-11-04 NOTE — Evaluation (Signed)
Speech Language Pathology Evaluation Patient Details Name: Steve Brown MRN: [id number] DOB: 1940-04-09 Today's Date: 11/04/2014 Time: 3267-1245 SLP Time Calculation (min) (ACUTE ONLY): 38 min  Problem List:  Patient Active Problem List   Diagnosis Date Noted  . TIA (transient ischemic attack) 11/03/2014  . OSA on CPAP 05/18/2014  . White matter disease 08/14/2013  . OSA (obstructive sleep apnea) 08/14/2013  . Unsteady gait 05/19/2013  . Mixed hyperlipidemia 04/24/2013  . Syncope 03/20/2013  . Chest pain 10/08/2012  . Hypertension   . Stroke ([place])   . History of TIAs   . Lumbago   . Other and unspecified hyperlipidemia   . Personal history of unspecified circulatory disease   . Unspecified fall   . Pain in joint, multiple sites   . Degeneration of cervical intervertebral disc   . Unspecified cardiovascular disease   . History of pulmonary embolism: June 2014,  Takes Xarelto 07/01/2012    Class: History of  . Hemiparesis ([place]) 07/18/2011  . Diabetes mellitus type 2 with complications ([place]) 80/99/8338  . HTN (hypertension) 07/18/2011  . CAD in native artery 07/18/2011  . History of recurrent TIAs 07/18/2011   Past Medical History:  Past Medical History  Diagnosis Date  . Diabetes mellitus   . Coronary artery disease   . Hypertension   . History of TIAs   . Lumbago   . Benign localized hyperplasia of prostate without urinary obstruction and other lower urinary tract symptoms (LUTS)   . Other and unspecified hyperlipidemia   . Unspecified essential hypertension   . Type II or unspecified type diabetes mellitus without mention of complication, not stated as uncontrolled   . Personal history of unspecified circulatory disease   . Unspecified fall   . Hypersomnia with sleep apnea, unspecified   . Pain in joint, multiple sites   . Degeneration of cervical intervertebral disc   . Unspecified cardiovascular disease   . Unsteady gait 05/19/2013    X 24 hours - improving.  Hx prior CVA/TIAs; multiple risk factors; on Xarelto  . Stroke [facility]) 2002, 2003    both sided weakness  . Trigger finger of both hands 11-17-13   Past Surgical History:  Past Surgical History  Procedure Laterality Date  . Hernia repair    . Carpal tunnel release      bilateral  . Elbow surgery      right elbow - nerve release  . Cervical disc surgery    . Lungs      "fluid pumped off lungs"  . Coronary stent placement  Feb. 2010  . Cardiac catheterization    . Coronary angioplasty    . Left heart catheterization with coronary angiogram N/A 04/03/2013    Procedure: LEFT HEART CATHETERIZATION WITH CORONARY ANGIOGRAM;  Surgeon: Jettie Booze, MD;  Location: [facility] CATH LAB;  Service: Cardiovascular;  Laterality: N/A;   HPI:  Pt is a 74 y/o male with a PMH of CVA, CAD, HTN, DVT, LUE weakness s/p cervical surgery, and back pain with reported radiculopathy in LLE. Pt presents with LUE weakness and associated slurred speech that started morning of admission, which resolved around 2pm.    Assessment / Plan / Recommendation Clinical Impression  Pt scored 28/30 per the [facility] - within normal range.  He described difficulty with concentrating and recalling information, such that it is interfering with his life.  We reviewed testing, particularly his normal scores in attention and recall. Pt described stressors in his life that may be contributing to  reduced functional attention/recall - we discussed compensatory strategies, activities, and utilizing his support system.  Pt quite introspective, concerned about his health and the health of his family members.  Provided support and encouragement.  Pt will not need f/u SLP services upon D/C.    SLP Assessment  Patient does not need any further Speech Lanaguage Pathology Services    Follow Up Recommendations       Frequency and Duration        Pertinent Vitals/Pain Pain Assessment: 0-10 Pain Score: 7  Pain Location: L  shoulder - arm Pain  Descriptors / Indicators: Aching Pain Intervention(s): Limited activity within patient's tolerance   SLP Goals     SLP Evaluation Prior Functioning  Cognitive/Linguistic Baseline: Baseline deficits Baseline deficit details: pt describes difficulty concentrating, poor ST memory Type of Home: Apartment Available Help at Discharge: Family;Friend(s);Available PRN/intermittently Vocation: Retired (retired Tour manager)   Cognition  Overall Cognitive Status: History of cognitive impairments - at baseline Arousal/Alertness: Awake/alert Orientation Level: Oriented X4 Attention: Selective Selective Attention: Appears intact Memory: Appears intact Awareness: Appears intact Problem Solving: Appears intact Executive Function: Reasoning Reasoning: Appears intact Safety/Judgment: Appears intact    Comprehension  Auditory Comprehension Overall Auditory Comprehension: Appears within functional limits for tasks assessed Visual Recognition/Discrimination Discrimination: Within Function Limits Reading Comprehension Reading Status: Within funtional limits    Expression Expression Primary Mode of Expression: Verbal Verbal Expression Overall Verbal Expression: Appears within functional limits for tasks assessed Written Expression Dominant Hand: Right   Oral / Motor Oral Motor/Sensory Function Overall Oral Motor/Sensory Function: Appears within functional limits for tasks assessed Motor Speech Overall Motor Speech: Appears within functional limits for tasks assessed   GO     Steve Brown 11/04/2014, 2:29 PM

## 2014-11-04 NOTE — Progress Notes (Signed)
VASCULAR LAB PRELIMINARY  PRELIMINARY  PRELIMINARY  PRELIMINARY  Carotid duplex completed.    Preliminary report:  Bilateral:  1-39% ICA stenosis.  Vertebral artery flow is antegrade.     Odetta Forness, RVS 11/04/2014, 4:15 PM

## 2014-11-04 NOTE — Progress Notes (Signed)
Occupational Therapy Evaluation Patient Details Name: Steve Brown MRN: [id number] DOB: 03-07-1940 Today's Date: 11/04/2014    History of Present Illness Pt is a 74 y/o male with a PMH of CVA, CAD, HTN, DVT, LUE weakness s/p cervical surgery, and back pain with reported radiculopathy in LLE. Pt presents with LUE weakness and associated slurred speech that started morning of admission, which resolved around 2pm.    Clinical Impression   PTA, pt lived alone and was independent with ADL and mobility. Active and goes to the [facility]. Pt demonstrates minimal deficits with fine motor/coordination skills LUE. Pt reports he has difficulty with this arm at baseline due to his neck and shoulder issues but that his arm feels "heavy". At this time, recommend follow up with [place]. Will follow acutely to address established goals.     Follow Up Recommendations  Home health OT    Equipment Recommendations  None recommended by OT    Recommendations for Other Services       Precautions / Restrictions Precautions Precautions: Fall Restrictions Weight Bearing Restrictions: No      Mobility Bed Mobility OOB in chair Transfers Overall transfer level: Modified independent Equipment used: None             General transfer comment: Increased time to power-up to full standing position. No physical assist required.  Able to get up from lower couch Able to bend over and pick up item from floor without LOB   Balance Overall balance assessment: No apparent balance deficits (not formally assessed)                                          ADL      Pt  Overall set up for ADL. Pt reports more difficulty wit fasteners.     mod I with functional mobility                  Pt lives alone. Feel it would be beneficial for HHOT to wokr with pt in his home to assure safety and retu                   Vision Vision Assessment?: Yes Eye Alignment: Within Functional  Limits Alignment/Gaze Preference: Within Defined Limits Tracking/Visual Pursuits: Able to track stimulus in all quads without difficulty Saccades: Within functional limits Convergence: Within functional limits Visual Fields: No apparent deficits Additional Comments: feels vision has returned to normal   Perception     Praxis      Pertinent Vitals/Pain Pain Assessment: 0-10 Pain Score: 7  Pain Location: L  shoulder - arm Pain Descriptors / Indicators: Aching Pain Intervention(s): Limited activity within patient's tolerance     Hand Dominance Right   Extremity/Trunk Assessment Upper Extremity Assessment Upper Extremity Assessment: LUE deficits/detail LUE Deficits / Details: Pt reports pain in his hand and feeling of weakness. MMT revealed only slight weakness when compared to R side.    Lower Extremity Assessment - below per PT eval Lower Extremity Assessment: LLE deficits/detail LLE Deficits / Details: Pt reports radicular pain from back and feeling of weakness compared to the R side. MMT revealed no strength differences and R=L. Initially L quad weaker than R, however with retest L=R.  LLE Sensation:  (With testing pt reports no sensation differences)   Cervical / Trunk Assessment Cervical / Trunk Assessment: Normal   Communication Communication Communication: No  difficulties   Cognition Arousal/Alertness: Awake/alert Behavior During Therapy: WFL for tasks assessed/performed Overall Cognitive Status: no family present to determine baseline       Memory: Decreased short-term memory (Pt reports decreased STM at baseline)             General Comments       Exercises Exercises: Other exercises Other Exercises Other Exercises: fine motor coordination/in -hand manipulation sk8ills using coins   Shoulder Instructions      Home Living Family/patient expects to be discharged to:: Private residence Living Arrangements: Alone Available Help at Discharge:  Family;Friend(s);Available PRN/intermittently Type of Home: Apartment Home Access: Stairs to enter Entrance Stairs-Number of Steps: 18 Entrance Stairs-Rails: Right Home Layout: One level     Bathroom Shower/Tub: Teacher, early years/pre: Standard Bathroom Accessibility: Yes   Home Equipment: Cane - single point;Grab bars - tub/shower          Prior Functioning/Environment Level of Independence: Independent        Comments: Driving, doing grocery shopping, community ambulator    OT Diagnosis: Generalized weakness   OT Problem List: Decreased strength;Decreased coordination   OT Treatment/Interventions: Self-care/ADL training;Therapeutic exercise;Neuromuscular education;DME and/or AE instruction;Therapeutic activities;Patient/family education    OT Goals(Current goals can be found in the care plan section) Acute Rehab OT Goals Patient Stated Goal: Return to the gym OT Goal Formulation: With patient Time For Goal Achievement: 11/11/14 Potential to Achieve Goals: Good  OT Frequency: Min 2X/week   Barriers to D/C:            Co-evaluation              End of Session Equipment Utilized During Treatment: Gait belt Nurse Communication: Mobility status;Other (comment) (mod I within room)  Activity Tolerance: Patient tolerated treatment well Patient left: in chair;with call bell/phone within reach   Time: 1411-1440 OT Time Calculation (min): 29 min Charges:  OT General Charges $OT Visit: 1 Procedure OT Evaluation $Initial OT Evaluation Tier I: 1 Procedure OT Treatments $Self Care/Home Management : 8-22 mins G-Codes:    Evangeline Utley,HILLARY 11/15/14, 3:00 PM   [facility], OTR/L  [phone] 11/15/14

## 2014-11-05 DIAGNOSIS — G459 Transient cerebral ischemic attack, unspecified: Principal | ICD-10-CM

## 2014-11-05 LAB — GLUCOSE, CAPILLARY
Glucose-Capillary: 221 mg/dL — ABNORMAL HIGH (ref 65–99)
Glucose-Capillary: 241 mg/dL — ABNORMAL HIGH (ref 65–99)

## 2014-11-05 MED ORDER — ASPIRIN EC 81 MG PO TBEC: 81.0000 mg | DELAYED_RELEASE_TABLET | Freq: Every day | ORAL | Status: DC

## 2014-11-05 NOTE — Care Management Note (Signed)
Case Management Note  Patient Details  Name: Steve Brown MRN: [id number] Date of Birth: 08-May-1940  Subjective/Objective:                    Action/Plan: Patient being discharged home today. PT/OT recommending home health services. CM spoke with the patient and he would rather have outpatient PT/OT. Patient states he has been to [facility] in the past and would like to use the rehab center again. CM spoke with Dr Verlon Au and he was in agreement to let pt go to outpatient rehab. CM placed orders through [facility] and placed the information on the AVS. Bedside RN updated.   Expected Discharge Date:                  Expected Discharge Plan:  Home/Self Care  In-House Referral:     Discharge planning Services  CM Consult  Post Acute Care Choice:    Choice offered to:     DME Arranged:    DME Agency:     HH Arranged:    [place] Agency:     Status of Service:  Completed, signed off  Medicare Important Message Given:    Date Medicare IM Given:    Medicare IM give by:    Date Additional Medicare IM Given:    Additional Medicare Important Message give by:     If discussed at [place] of Stay Meetings, dates discussed:    Additional Comments:  Pollie Friar, RN 11/05/2014, 10:12 AM

## 2014-11-05 NOTE — Discharge Summary (Signed)
Physician Discharge Summary  Steve Brown JXB:[id number] DOB: [birth year] DOA: 11/03/2014  PCP:  Melinda Crutch, MD  Admit date: 11/03/2014 Discharge date: 11/05/2014  Time spent: 30 minutes  Recommendations for Outpatient Follow-up:  1. Rpt Aic and lipid panel in 3 mo 2. Needs close f/u Dr. Brett Fairy   Discharge Diagnoses:  Active Problems:   TIA (transient ischemic attack)   Discharge Condition: stable  Diet recommendation: hh diabetic  Filed Weights   11/03/14 2122  Weight: 85.231 kg (187 lb 14.4 oz)    History of present illness:   74 y/o ? Prior CVA x 2 2002/2003 TIA 2013 Neck surg 2004-has severe Lumbar stenosis and follows Pain management Dr. Vira Blanco DMt yy ii OSA on CPAP foll Dr. Brett Fairy Htn CAd c LAD stent 2012 Bilat PE 07/2012  Admitted with R sided Arm numbness and weakness  Hospital Course:  Assessment/Plan:   1. Stroke versus TIA-ordered MIR brain neg for new infarct. Already on Rivaroxiban 20 daily for PE which should continue.  Should also continue ASA 81 daily which patient only takes sporadically.  Needs OP follow up Dr. Brett Fairy.  I discussed case with Neurologist and it was felt that since Echo had been performed last yr, low utility rpt 2. OSA on CPAP-we will continue CPAP 3. Hypertension allow permissive hypertension but will restart home meds ramipril 1.25 daily, Lasix 40 daily 4. Diabetes mellitus type 2 with complications neuropathy-continue for now 18 units twice a day 7030 insulin, may continue metformin 2000 milligrams twice a day,  hold Prandin 2 mg 3 times a day given risk for hypoglycemia. Sliding scale insulin. Blood sugars ranging between 117-30. Continue pregabalin 300 twice a day 5. Prior history CAD 2012-see above discussion.  6. Bilateral PE in 2014-continues Xarelto 20 od 7. Body mass index is 36.7 kg/(m^2).-weight loss strategies as an outpatient 8. Herpes-continue valacyclovir 500 daily 9. Reflux continue pantoprazole 40  daily 10. BPH, continue dutasteride 0.50 daily   Procedures:  Carotids  MRI  Discharge Exam: Filed Vitals:   11/05/14 0520  BP: 120/62  Pulse: 56  Temp: 98.2 F (36.8 C)  Resp: 20   Alert pleasant in nad No further weakness but some mild numbness L arm No other concerns eating and drinking without issue   General: eomi ncat Cardiovascular:  s1 s2 no m/r/f Respiratory: clear no added sound  Discharge Instructions   Discharge Instructions    Diet - low sodium heart healthy    Complete by:  As directed      Discharge instructions    Complete by:  As directed   Please continue your regular medications without any change Would recommend that you see Dr. Brett Fairy for concerns fo recent TIA in follow up Continue Aspirin and Xarelto     Increase activity slowly    Complete by:  As directed           Current Discharge Medication List    START taking these medications   Details  aspirin EC 81 MG tablet Take 1 tablet (81 mg total) by mouth daily.      CONTINUE these medications which have NOT CHANGED   Details  Ascorbic Acid (VITAMIN C PO) Take 1 tablet by mouth daily.    chlorhexidine (PERIDEX) 0.12 % solution Use as directed 15 mLs in the mouth or throat 2 (two) times daily.     cholecalciferol (VITAMIN D) 1000 UNITS tablet Take 1,000 Units by mouth daily.    diclofenac sodium (VOLTAREN) 1 % GEL  Apply 2 g topically 3 (three) times daily as needed (pain).    dutasteride (AVODART) 0.5 MG capsule Take 0.5 mg by mouth daily.    furosemide (LASIX) 20 MG tablet Take 20 mg by mouth daily as needed (swelling).     HUMALOG MIX 75/25 KWIKPEN (75-25) 100 UNIT/ML Kwikpen Inject into the skin. 20 units in am, 18 units in pm. Refills: 1    metFORMIN (GLUCOPHAGE) 500 MG tablet Take 2 tablets (1,000 mg total) by mouth 2 (two) times daily with a meal.    Multiple Vitamin (MULTIVITAMIN WITH MINERALS) TABS Take 1 tablet by mouth every evening.     pantoprazole (PROTONIX)  40 MG tablet Take 40 mg by mouth daily.    pregabalin (LYRICA) 300 MG capsule Take 1 capsule (300 mg total) by mouth 2 (two) times daily. Qty: 180 capsule, Refills: 3   Associated Diagnoses: Stroke ([place]); Neuropathic pain of hand, left    ramipril (ALTACE) 1.25 MG capsule Take 1.25 mg by mouth daily.  Refills: 4    Rivaroxaban (XARELTO) 20 MG TABS tablet Take 1 tablet (20 mg total) by mouth daily with supper. Qty: 30 tablet    rosuvastatin (CRESTOR) 10 MG tablet Take 10 mg by mouth every evening.     valACYclovir (VALTREX) 500 MG tablet Take 500 mg by mouth daily.    BD PEN NEEDLE NANO U/F 32G X 4 MM MISC     DULoxetine (CYMBALTA) 60 MG capsule Take 60 mg by mouth daily.    ferrous sulfate 325 (65 FE) MG tablet Take 325 mg by mouth daily with breakfast.    fluticasone (FLONASE) 50 MCG/ACT nasal spray Place 2 sprays into both nostrils daily as needed for allergies.     lactobacillus acidophilus & bulgar (LACTINEX) chewable tablet Chew 1 tablet by mouth 2 (two) times daily. Refills: 0    nitroGLYCERIN (NITROSTAT) 0.4 MG SL tablet Place 1 tablet (0.4 mg total) under the tongue every 5 (five) minutes as needed for chest pain. Qty: 25 tablet, Refills: 12    nystatin (MYCOSTATIN) 100000 UNIT/ML suspension Refills: 3    prednisoLONE acetate (PRED FORTE) 1 % ophthalmic suspension Place 1 drop into both eyes as needed.       STOP taking these medications     repaglinide (PRANDIN) 2 MG tablet        Allergies  Allergen Reactions  . Phenergan [Promethazine] Other (See Comments)    Mood changes       The results of significant diagnostics from this hospitalization (including imaging, microbiology, ancillary and laboratory) are listed below for reference.    Significant Diagnostic Studies: Ct Head Wo Contrast  11/03/2014  CLINICAL DATA:  One day history of left arm weakness EXAM: CT HEAD WITHOUT CONTRAST TECHNIQUE: Contiguous axial images were obtained from the base of the  skull through the vertex without intravenous contrast. COMPARISON:  Head CT May 20, 2013; brain MRI Jun 05, 2014 FINDINGS: Age related volume loss is stable. There is no intracranial mass, hemorrhage, extra-axial fluid collection, or midline shift. A prior tiny lacunar infarct in the posterior right corona radiata is less apparent at this time compared to the prior study. There is minimal periventricular small vessel disease elsewhere. There no acute infarct is evident. The bony calvarium appears intact. The mastoid air cells are clear. Extensive opacification in the right sphenoid sinus is again noted. There is also mild ethmoid sinus disease bilaterally. IMPRESSION: Age related volume loss. Prior tiny lacunar infarct in the right  corona radiata posteriorly. Minimal periventricular small vessel disease. No intracranial mass, hemorrhage, or acute appearing infarct. Question mucocele in the right sphenoid sinus again noted. Mild ethmoid sinus disease bilaterally. Electronically Signed   By: Lowella Grip III M.D.   On: 11/03/2014 16:04   Mr Brain Wo Contrast  11/04/2014  CLINICAL DATA:  One day history of left arm weakness. EXAM: MRI HEAD WITHOUT CONTRAST TECHNIQUE: Multiplanar, multiecho pulse sequences of the brain and surrounding structures were obtained without intravenous contrast. COMPARISON:  CT head without contrast 11/03/2014. MRI brain without contrast 06/05/2014. FINDINGS: The diffusion-weighted images demonstrate no evidence for acute or subacute infarction. A remote anterior left frontal lobe cortical infarct is again seen. Minimal periventricular T2 changes otherwise within normal limits for age. No acute hemorrhage or mass lesion is present. The ventricles are of normal size. A left paramedian pontine remote lacunar infarct is again noted. Remote lacunar infarcts are present in the inferior cerebellum bilaterally. The left vertebral artery is chronically occluded. The right vertebral artery  feeds the basilar artery which is patent. Anterior circulation is patent. Chronic opacification of the right sphenoid sinus is stable. Mild mucosal thickening is again seen in the left sphenoid sinus. The anterior paranasal sinuses are clear. Bilateral lens replacements are present. The globes and orbits are otherwise intact. Skullbase is within normal limits. Midline images demonstrate postoperative changes in the cervical spine. No other focal lesions are evident. IMPRESSION: 1. No acute abnormality or significant interval change. 2. Remote infarcts involving the anterior left frontal lobe, the left para midline pons, in the inferior cerebellum bilaterally. 3. Chronic occlusion of the left vertebral artery. Electronically Signed   By: San Morelle M.D.   On: 11/04/2014 18:28    Microbiology: No results found for this or any previous visit (from the past 240 hour(s)).   Labs: Basic Metabolic Panel:  Recent Labs Lab 11/03/14 1525  NA 138  K 4.5  CL 105  CO2 28  GLUCOSE 222*  BUN 12  CREATININE 1.06  CALCIUM 9.2   Liver Function Tests:  Recent Labs Lab 11/03/14 1525  AST 22  ALT 21  ALKPHOS 63  BILITOT 0.5  PROT 5.9*  ALBUMIN 3.2*   No results for input(s): LIPASE, AMYLASE in the last 168 hours. No results for input(s): AMMONIA in the last 168 hours. CBC:  Recent Labs Lab 11/03/14 1525  WBC 4.7  NEUTROABS 2.0  HGB 12.7*  HCT 37.0*  MCV 88.9  PLT 196   Cardiac Enzymes: No results for input(s): CKTOTAL, CKMB, CKMBINDEX, TROPONINI in the last 168 hours. BNP: BNP (last 3 results) No results for input(s): BNP in the last 8760 hours.  ProBNP (last 3 results) No results for input(s): PROBNP in the last 8760 hours.  CBG:  Recent Labs Lab 11/04/14 1125 11/04/14 1822 11/04/14 1847 11/04/14 2139 11/05/14 0636  GLUCAP 225* 53* 81 238* 221*       Signed:  Nita Sells  [facility] 11/05/2014, 9:16 AM

## 2014-11-05 NOTE — Progress Notes (Signed)
Physical Therapy Treatment Patient Details Name: Steve Brown MRN: [id number] DOB: Jan 02, 1941 Today's Date: 11/05/2014    History of Present Illness Pt is a 74 y/o male with a PMH of CVA, CAD, HTN, DVT, LUE weakness s/p cervical surgery, and back pain with reported radiculopathy in LLE. Pt presents with LUE weakness and associated slurred speech that started morning of admission, which resolved around 2pm.     PT Comments    Pt progressing towards physical therapy goals. Was able to perform transfers and ambulation without assist, and negotiated a flight of stairs with supervision for safety. Will continue to follow and progress as able per POC.   Follow Up Recommendations  Outpatient PT;Supervision - Intermittent     Equipment Recommendations  None recommended by PT    Recommendations for Other Services       Precautions / Restrictions Precautions Precautions: Fall Restrictions Weight Bearing Restrictions: No    Mobility  Bed Mobility               General bed mobility comments: Pt sitting up in chair upon PT arrival.   Transfers Overall transfer level: Modified independent Equipment used: None             General transfer comment: No physical assist required.   Ambulation/Gait Ambulation/Gait assistance: Modified independent (Device/Increase time) Ambulation Distance (Feet): 600 Feet Assistive device: None Gait Pattern/deviations: Decreased weight shift to left;Step-through pattern Gait velocity: Decreased Gait velocity interpretation: Below normal speed for age/gender General Gait Details: Supervision for safety. Noted a more natural gait pattern and speed compared to evaluation.    Stairs Stairs: Yes Stairs assistance: Supervision Stair Management: One rail Right;Alternating pattern;Step to pattern;Forwards Number of Stairs: 11 General stair comments: No unsteadiness noted.   Wheelchair Mobility    Modified Rankin (Stroke Patients  Only) Modified Rankin (Stroke Patients Only) Pre-Morbid Rankin Score: No symptoms Modified Rankin: Slight disability     Balance Overall balance assessment: No apparent balance deficits (not formally assessed)                                  Cognition Arousal/Alertness: Awake/alert Behavior During Therapy: WFL for tasks assessed/performed Overall Cognitive Status: History of cognitive impairments - at baseline       Memory: Decreased short-term memory              Exercises      General Comments        Pertinent Vitals/Pain Pain Assessment: No/denies pain    Home Living                      Prior Function            PT Goals (current goals can now be found in the care plan section) Acute Rehab PT Goals Patient Stated Goal: Return to the gym PT Goal Formulation: With patient Time For Goal Achievement: 11/11/14 Potential to Achieve Goals: Good Progress towards PT goals: Progressing toward goals    Frequency  Min 4X/week    PT Plan Current plan remains appropriate    Co-evaluation             End of Session Equipment Utilized During Treatment: Gait belt Activity Tolerance: Patient tolerated treatment well Patient left: in chair;with call bell/phone within reach     Time: 1059-1115 PT Time Calculation (min) (ACUTE ONLY): 16 min  Charges:  $Gait Training: 8-22 mins  G Codes:      Rolinda Roan 11/05/2014, 12:10 PM  Rolinda Roan, PT, DPT Acute Rehabilitation Services Pager: [phone]

## 2014-11-05 NOTE — Progress Notes (Addendum)
Inpatient Diabetes Program Recommendations  AACE/ADA: New Consensus Statement on Inpatient Glycemic Control (2015)  Target Ranges:  Prepandial:   less than 140 mg/dL      Peak postprandial:   less than 180 mg/dL (1-2 hours)      Critically ill patients:  140 - 180 mg/dL   Review of Glycemic Control  Diabetes history: DM 2 Outpatient Diabetes medications: Prandin 2 mg TID, Metformin 500 mg BID, 75/25 18 units BID Current orders for Inpatient glycemic control: Prandin 2 mg TID, Metformin 500 mg BID, 75/25 18 units BID, Novolog Sensitive TID  Inpatient Diabetes Program Recommendations: Oral Agents: Patient did not get Prandin yesterday until supper time. After the prandin was given at supper time patient had hypoglycemia 30 minutes later of 53 mg/dl. While inpatient please consider discontinuing the oral Prandin. Please consider increasing 70/30 to 22 units BID due to the hyperglycemia in the 200's.  Thanks,  Tama Headings RN, MSN, [facility] Inpatient Diabetes Coordinator Team Pager [phone] (8a-5p)

## 2014-11-05 NOTE — Progress Notes (Signed)
Patient is comfortable , alert oriented with no sign of distress.  Discharge instruction given.  Patient discharge home.

## 2014-11-05 NOTE — ED Provider Notes (Signed)
Arrival Date & Time: 11/03/14 & 1444 History  HPI Limitations: none. Chief Complaint  Patient presents with  . Transient Ischemic Attack   HPI Steve Brown is a 74 y.o. male with a chief complaint of Transient Ischemic Attack with endorsement of left arm weakness that started around 6 AM and that rapidly improved with near resolution by arrival of EMS. Associated slurred speech that resolved within 2 hours of onset of symptoms. Felt unsteady on feet per the patient and denies any other symptomatic abnormalities or weakness.  Patient with Hx of prior TIAs and CVA with residual bilateral weakness. Patient is currently on DVT treatment with Xarelto.  Past Medical History  I reviewed & agree with nursing's documentation on PMHx, PSHx, SHx and FHx. Past Medical History  Diagnosis Date  . Diabetes mellitus   . Coronary artery disease   . Hypertension   . History of TIAs   . Lumbago   . Benign localized hyperplasia of prostate without urinary obstruction and other lower urinary tract symptoms (LUTS)   . Other and unspecified hyperlipidemia   . Unspecified essential hypertension   . Type II or unspecified type diabetes mellitus without mention of complication, not stated as uncontrolled   . Personal history of unspecified circulatory disease   . Unspecified fall   . Hypersomnia with sleep apnea, unspecified   . Pain in joint, multiple sites   . Degeneration of cervical intervertebral disc   . Unspecified cardiovascular disease   . Unsteady gait 05/19/2013    X 24 hours - improving. Hx prior CVA/TIAs; multiple risk factors; on Xarelto  . Stroke [facility]) 2002, 2003    both sided weakness  . Trigger finger of both hands 11-17-13   Past Surgical History  Procedure Laterality Date  . Hernia repair    . Carpal tunnel release      bilateral  . Elbow surgery      right elbow - nerve release  . Cervical disc surgery    . Lungs      "fluid pumped off lungs"  . Coronary stent placement   Feb. 2010  . Cardiac catheterization    . Coronary angioplasty    . Left heart catheterization with coronary angiogram N/A 04/03/2013    Procedure: LEFT HEART CATHETERIZATION WITH CORONARY ANGIOGRAM;  Surgeon: Jettie Booze, MD;  Location: [facility] CATH LAB;  Service: Cardiovascular;  Laterality: N/A;   Social History   Social History  . Marital Status: Divorced    Spouse Name: N/A  . Number of Children: 4  . Years of Education: 12   Occupational History  .      retired   Social History Main Topics  . Smoking status: Never Smoker   . Smokeless tobacco: Never Used  . Alcohol Use: No  . Drug Use: No  . Sexual Activity: Not Asked   Other Topics Concern  . None   Social History Narrative   Patient lives at home alone and he is single.  Patient is retired.    Caffeine - one cups daily.   Right handed.   Patient has a high school education.   Patient has four adult children.   Family History  Problem Relation Age of Onset  . Aneurysm Mother   . Cancer Father     Review of Systems  Complete ROS obtained and pertinent positive and negatives documented above in HPI. All other ROS negative.  Allergies  Phenergan  Home Medications   Prior to Admission medications  Medication Sig Start Date End Date Taking? Authorizing Provider  Ascorbic Acid (VITAMIN C PO) Take 1 tablet by mouth daily.   Yes Historical Provider, MD  chlorhexidine (PERIDEX) 0.12 % solution Use as directed 15 mLs in the mouth or throat 2 (two) times daily.  07/25/13  Yes Historical Provider, MD  cholecalciferol (VITAMIN D) 1000 UNITS tablet Take 1,000 Units by mouth daily.   Yes Historical Provider, MD  diclofenac sodium (VOLTAREN) 1 % GEL Apply 2 g topically 3 (three) times daily as needed (pain).   Yes Historical Provider, MD  dutasteride (AVODART) 0.5 MG capsule Take 0.5 mg by mouth daily.   Yes Historical Provider, MD  furosemide (LASIX) 20 MG tablet Take 20 mg by mouth daily as needed (swelling).    Yes  Historical Provider, MD  HUMALOG MIX 75/25 KWIKPEN (75-25) 100 UNIT/ML Kwikpen Inject into the skin. 20 units in am, 18 units in pm. 10/21/13  Yes Historical Provider, MD  metFORMIN (GLUCOPHAGE) 500 MG tablet Take 2 tablets (1,000 mg total) by mouth 2 (two) times daily with a meal. Patient taking differently: Take 500 mg by mouth 2 (two) times daily with a meal.  04/05/13  Yes Jettie Booze, MD  Multiple Vitamin (MULTIVITAMIN WITH MINERALS) TABS Take 1 tablet by mouth every evening.    Yes Historical Provider, MD  pantoprazole (PROTONIX) 40 MG tablet Take 40 mg by mouth daily.   Yes Historical Provider, MD  pregabalin (LYRICA) 300 MG capsule Take 1 capsule (300 mg total) by mouth 2 (two) times daily. 07/26/12  Yes Carmen Dohmeier, MD  ramipril (ALTACE) 1.25 MG capsule Take 1.25 mg by mouth daily.  10/13/13  Yes Historical Provider, MD  Rivaroxaban (XARELTO) 20 MG TABS tablet Take 1 tablet (20 mg total) by mouth daily with supper. Patient taking differently: Take 20 mg by mouth daily.  04/05/13  Yes Jettie Booze, MD  rosuvastatin (CRESTOR) 10 MG tablet Take 10 mg by mouth every evening.    Yes Historical Provider, MD  valACYclovir (VALTREX) 500 MG tablet Take 500 mg by mouth daily.   Yes Historical Provider, MD  aspirin EC 81 MG tablet Take 1 tablet (81 mg total) by mouth daily. 11/05/14   Nita Sells, MD  BD PEN NEEDLE NANO U/F 32G X 4 MM MISC  09/01/13   Historical Provider, MD  DULoxetine (CYMBALTA) 60 MG capsule Take 60 mg by mouth daily.    Historical Provider, MD  ferrous sulfate 325 (65 FE) MG tablet Take 325 mg by mouth daily with breakfast.    Historical Provider, MD  fluticasone (FLONASE) 50 MCG/ACT nasal spray Place 2 sprays into both nostrils daily as needed for allergies.  05/30/13   Historical Provider, MD  lactobacillus acidophilus & bulgar (LACTINEX) chewable tablet Chew 1 tablet by mouth 2 (two) times daily. 05/19/14   Historical Provider, MD  nitroGLYCERIN (NITROSTAT)  0.4 MG SL tablet Place 1 tablet (0.4 mg total) under the tongue every 5 (five) minutes as needed for chest pain. 04/03/13   Jettie Booze, MD  nystatin (MYCOSTATIN) 100000 UNIT/ML suspension  10/09/13   Historical Provider, MD  prednisoLONE acetate (PRED FORTE) 1 % ophthalmic suspension Place 1 drop into both eyes as needed.  09/05/13   Historical Provider, MD    Physical Exam  BP 133/61 mmHg  Pulse 64  Temp(Src) 98.9 F (37.2 C) (Oral)  Resp 20  Ht 5' (1.524 m)  Wt 187 lb 14.4 oz (85.231 kg)  BMI 36.70 kg/m2  SpO2 100% Physical Exam  Constitutional: He is oriented to person, place, and time. He appears well-developed and well-nourished.  Non-toxic appearance. He does not appear ill. No distress.  HENT:  Head: Normocephalic and atraumatic.  Right Ear: External ear normal.  Left Ear: External ear normal.  Eyes: Pupils are equal, round, and reactive to light. No scleral icterus.  Neck: Normal range of motion. Neck supple. No tracheal deviation present.  Cardiovascular: Normal heart sounds and intact distal pulses.   No murmur heard. Pulmonary/Chest: Effort normal and breath sounds normal. No stridor. No respiratory distress. He has no wheezes. He has no rales.  Abdominal: Soft. Bowel sounds are normal. He exhibits no distension. There is no tenderness. There is no rebound and no guarding.  Musculoskeletal: Normal range of motion.  Neurological: He is alert and oriented to person, place, and time. He has normal reflexes. No cranial nerve deficit or sensory deficit. Coordination normal.  LUE 4/5 and RUE 4+/5. LLE 4/5 and RLE 4/5 Strength.  Skin: Skin is warm and dry. No pallor.  Psychiatric: He has a normal mood and affect. His behavior is normal.  Nursing note and vitals reviewed.   ED Course  Procedures Labs Review Labs Reviewed  PROTIME-INR - Abnormal; Notable for the following:    Prothrombin Time 16.9 (*)    All other components within normal limits  CBC - Abnormal;  Notable for the following:    RBC 4.16 (*)    Hemoglobin 12.7 (*)    HCT 37.0 (*)    All other components within normal limits  COMPREHENSIVE METABOLIC PANEL - Abnormal; Notable for the following:    Glucose, Bld 222 (*)    Total Protein 5.9 (*)    Albumin 3.2 (*)    All other components within normal limits  URINALYSIS, ROUTINE W REFLEX MICROSCOPIC (NOT AT [facility]) - Abnormal; Notable for the following:    Glucose, UA >1000 (*)    All other components within normal limits  GLUCOSE, CAPILLARY - Abnormal; Notable for the following:    Glucose-Capillary 176 (*)    All other components within normal limits  GLUCOSE, CAPILLARY - Abnormal; Notable for the following:    Glucose-Capillary 176 (*)    All other components within normal limits  GLUCOSE, CAPILLARY - Abnormal; Notable for the following:    Glucose-Capillary 225 (*)    All other components within normal limits  GLUCOSE, CAPILLARY - Abnormal; Notable for the following:    Glucose-Capillary 53 (*)    All other components within normal limits  GLUCOSE, CAPILLARY - Abnormal; Notable for the following:    Glucose-Capillary 238 (*)    All other components within normal limits  GLUCOSE, CAPILLARY - Abnormal; Notable for the following:    Glucose-Capillary 221 (*)    All other components within normal limits  GLUCOSE, CAPILLARY - Abnormal; Notable for the following:    Glucose-Capillary 241 (*)    All other components within normal limits  APTT  DIFFERENTIAL  URINE RAPID DRUG SCREEN, HOSP PERFORMED  ETHANOL  URINE MICROSCOPIC-ADD ON  GLUCOSE, CAPILLARY  I-STAT TROPOININ, ED    Imaging Review Mr Brain Wo Contrast  11/04/2014  CLINICAL DATA:  One day history of left arm weakness. EXAM: MRI HEAD WITHOUT CONTRAST TECHNIQUE: Multiplanar, multiecho pulse sequences of the brain and surrounding structures were obtained without intravenous contrast. COMPARISON:  CT head without contrast 11/03/2014. MRI brain without contrast 06/05/2014.  FINDINGS: The diffusion-weighted images demonstrate no evidence for acute or subacute infarction.  A remote anterior left frontal lobe cortical infarct is again seen. Minimal periventricular T2 changes otherwise within normal limits for age. No acute hemorrhage or mass lesion is present. The ventricles are of normal size. A left paramedian pontine remote lacunar infarct is again noted. Remote lacunar infarcts are present in the inferior cerebellum bilaterally. The left vertebral artery is chronically occluded. The right vertebral artery feeds the basilar artery which is patent. Anterior circulation is patent. Chronic opacification of the right sphenoid sinus is stable. Mild mucosal thickening is again seen in the left sphenoid sinus. The anterior paranasal sinuses are clear. Bilateral lens replacements are present. The globes and orbits are otherwise intact. Skullbase is within normal limits. Midline images demonstrate postoperative changes in the cervical spine. No other focal lesions are evident. IMPRESSION: 1. No acute abnormality or significant interval change. 2. Remote infarcts involving the anterior left frontal lobe, the left para midline pons, in the inferior cerebellum bilaterally. 3. Chronic occlusion of the left vertebral artery. Electronically Signed   By: San Morelle M.D.   On: 11/04/2014 18:28    Laboratory and Imaging results were personally reviewed by myself and used in the medical decision making of this patient's treatment and disposition.  EKG Interpretation  EKG Interpretation  Date/Time:  Tuesday November 03 2014 14:58:51 EDT Ventricular Rate:  55 PR Interval:  153 QRS Duration: 100 QT Interval:  401 QTC Calculation: 383 R Axis:   -6 Text Interpretation:  Sinus rhythm Low voltage, precordial leads Abnormal R-wave progression, early transition ED PHYSICIAN INTERPRETATION AVAILABLE IN [facility] Confirmed by TEST, Record ([id number]) on 11/05/2014 9:31:39 AM      MDM   Steve Brown is a 74 y.o. male with H&P as above. ED clinical course as follows:  Neurologic deficits are consistent with TIA as patient presents with near symptomatic resolution. Patient also presents outside tPA window at approximate onset > 6 hrs.   ECG reveals no concern for ischemia, and in light of negative troponin do not suspect ACS. CBC and CMP unremarkable.   I obtained CT head that revealed prior ischemic changes and no acute bleeds. I obtained consultation with the Hospitalist service for concerns of TIA with need for further risk reduction. I discussed the patients clinical course including their H&P, as well as, their diagnostic studies. Based upon that discussion, we've decided that the patient will require admission.  Clinical Impression:  1. Other specified transient cerebral ischemias   2. Left arm weakness   3. CVA (cerebral infarction)    Disposition:  Admit for continued workup and management of TIA.  Patient care discussed with Dr. Delane Ginger, who oversaw their evaluation & treatment & voiced agreement. House Officer: Voncille Lo, MD, Emergency Medicine.   Voncille Lo, MD 11/06/14 0005  Lajean Saver, MD 11/09/14 [id number]

## 2014-11-09 DIAGNOSIS — I10 Essential (primary) hypertension: Secondary | ICD-10-CM | POA: Diagnosis not present

## 2014-11-09 DIAGNOSIS — Z794 Long term (current) use of insulin: Secondary | ICD-10-CM | POA: Diagnosis not present

## 2014-11-09 DIAGNOSIS — Z23 Encounter for immunization: Secondary | ICD-10-CM | POA: Diagnosis not present

## 2014-11-09 DIAGNOSIS — R079 Chest pain, unspecified: Secondary | ICD-10-CM | POA: Diagnosis not present

## 2014-11-09 DIAGNOSIS — I679 Cerebrovascular disease, unspecified: Secondary | ICD-10-CM | POA: Diagnosis not present

## 2014-11-09 DIAGNOSIS — E1140 Type 2 diabetes mellitus with diabetic neuropathy, unspecified: Secondary | ICD-10-CM | POA: Diagnosis not present

## 2014-11-15 ENCOUNTER — Ambulatory Visit
Admission: RE | Admit: 2014-11-15 | Discharge: 2014-11-15 | Disposition: A | Discharge status: Home / Self Care | Payer: Medicare Other | Source: Ambulatory Visit | Attending: Orthopedic Surgery | Admitting: Orthopedic Surgery

## 2014-11-15 DIAGNOSIS — M25512 Pain in left shoulder: Secondary | ICD-10-CM | POA: Diagnosis not present

## 2014-11-15 DIAGNOSIS — M75102 Unspecified rotator cuff tear or rupture of left shoulder, not specified as traumatic: Secondary | ICD-10-CM

## 2014-11-17 ENCOUNTER — Ambulatory Visit: Payer: Medicare Other | Admitting: Physical Therapy

## 2014-11-17 ENCOUNTER — Ambulatory Visit: Payer: Medicare Other | Attending: Family Medicine | Admitting: Occupational Therapy

## 2014-11-17 ENCOUNTER — Telehealth: Payer: Self-pay

## 2014-11-17 ENCOUNTER — Encounter: Payer: Self-pay | Admitting: Physical Therapy

## 2014-11-17 DIAGNOSIS — M6281 Muscle weakness (generalized): Secondary | ICD-10-CM | POA: Insufficient documentation

## 2014-11-17 DIAGNOSIS — R6889 Other general symptoms and signs: Secondary | ICD-10-CM | POA: Diagnosis not present

## 2014-11-17 DIAGNOSIS — M79601 Pain in right arm: Secondary | ICD-10-CM | POA: Diagnosis not present

## 2014-11-17 NOTE — Telephone Encounter (Signed)
Spoke to pt regarding his appt tomorrow with Jinny Blossom, NP. Dr. Brett Fairy saw the pt in May of 2016 and advised him to follow up in a year. I called to see if he needs to keep the appt with [facility] tomorrow. He says that he does need to keep it because his PCP said he needed to follow up with neurology regarding his cpap. Pt reports that he has had a TIA since he was last here. Pt wants to keep the appt tomorrow with [facility].

## 2014-11-18 ENCOUNTER — Ambulatory Visit (INDEPENDENT_AMBULATORY_CARE_PROVIDER_SITE_OTHER): Payer: Medicare Other | Admitting: Adult Health

## 2014-11-18 ENCOUNTER — Encounter: Payer: Self-pay | Admitting: Adult Health

## 2014-11-18 VITALS — BP 121/69 | HR 69 | Ht 65.0 in | Wt 190.0 lb

## 2014-11-18 DIAGNOSIS — G609 Hereditary and idiopathic neuropathy, unspecified: Secondary | ICD-10-CM | POA: Diagnosis not present

## 2014-11-18 DIAGNOSIS — G4733 Obstructive sleep apnea (adult) (pediatric): Secondary | ICD-10-CM

## 2014-11-18 DIAGNOSIS — I10 Essential (primary) hypertension: Secondary | ICD-10-CM | POA: Diagnosis not present

## 2014-11-18 DIAGNOSIS — Z8673 Personal history of transient ischemic attack (TIA), and cerebral infarction without residual deficits: Secondary | ICD-10-CM

## 2014-11-18 DIAGNOSIS — E7800 Pure hypercholesterolemia, unspecified: Secondary | ICD-10-CM | POA: Diagnosis not present

## 2014-11-18 DIAGNOSIS — Z9989 Dependence on other enabling machines and devices: Principal | ICD-10-CM

## 2014-11-18 DIAGNOSIS — I639 Cerebral infarction, unspecified: Secondary | ICD-10-CM | POA: Diagnosis not present

## 2014-11-18 DIAGNOSIS — E1165 Type 2 diabetes mellitus with hyperglycemia: Secondary | ICD-10-CM | POA: Diagnosis not present

## 2014-11-18 NOTE — Progress Notes (Signed)
I agree with the assessment and plan as directed by NP .The patient is known to me .   Laquesha Holcomb, MD  

## 2014-11-18 NOTE — Therapy (Signed)
[facility] [address] [place], [place], [address] Phone: [phone]   Fax:  [phone]  Physical Therapy Evaluation  Patient Details  Name: Steve Brown MRN: [id number] Date of Birth: 05-08-1940 Referring Provider: Nita Sells MD  Encounter Date: 11/17/2014      PT End of Session - 11/17/14 1445    Visit Number 1   Number of Visits 1   Authorization Type Medicare   PT Start Time 9935   PT Stop Time 1440   PT Time Calculation (min) 37 min   Activity Tolerance Patient tolerated treatment well   Behavior During Therapy [facility] for tasks assessed/performed      Past Medical History  Diagnosis Date  . Diabetes mellitus   . Coronary artery disease   . Hypertension   . History of TIAs   . Lumbago   . Benign localized hyperplasia of prostate without urinary obstruction and other lower urinary tract symptoms (LUTS)   . Other and unspecified hyperlipidemia   . Unspecified essential hypertension   . Type II or unspecified type diabetes mellitus without mention of complication, not stated as uncontrolled   . Personal history of unspecified circulatory disease   . Unspecified fall   . Hypersomnia with sleep apnea, unspecified   . Pain in joint, multiple sites   . Degeneration of cervical intervertebral disc   . Unspecified cardiovascular disease   . Unsteady gait 05/19/2013    X 24 hours - improving. Hx prior CVA/TIAs; multiple risk factors; on Xarelto  . Stroke [facility]) 2002, 2003    both sided weakness  . Trigger finger of both hands 11-17-13    Past Surgical History  Procedure Laterality Date  . Hernia repair    . Carpal tunnel release      bilateral  . Elbow surgery      right elbow - nerve release  . Cervical disc surgery    . Lungs      "fluid pumped off lungs"  . Coronary stent placement  Feb. 2010  . Cardiac catheterization    . Coronary angioplasty    . Left heart catheterization with  coronary angiogram N/A 04/03/2013    Procedure: LEFT HEART CATHETERIZATION WITH CORONARY ANGIOGRAM;  Surgeon: Jettie Booze, MD;  Location[facility] CATH LAB;  Service: Cardiovascular;  Laterality: N/A;    There were no vitals filed for this visit.  Visit Diagnosis:  Decreased functional activity tolerance      Subjective Assessment - 11/17/14 1410    Subjective On 11/03/2014 he awoke with visual changes with left UE heaviness and balance instability. He went to ED with diagnosis of TIA. He was admitted 11/03/2014 - 11/05/2014. He was referred for PT & OT evaluations.    Patient Stated Goals He wants to get some of pain out and improve balance.    Currently in Pain? Yes   Pain Score 8   in last week best 6/10 worst 9/10   Pain Location Abdomen   Pain Orientation Left   Pain Descriptors / Indicators Radiating;Aching;Burning   Pain Type Chronic pain   Pain Radiating Towards radates to hand   Pain Onset More than a month ago   Pain Frequency Constant   Aggravating Factors  carry groceries or move it wrong    Pain Relieving Factors heat, tens unit, voltaren gel   Pain Score 9   Pain Location Back   Pain Orientation Mid;Lower   Pain Descriptors / Indicators Burning;Stabbing;Aching   Pain  Type Chronic pain   Pain Onset More than a month ago   Pain Frequency Constant   Aggravating Factors  walking, bending, sitting too long,    Pain Relieving Factors TENS unit, voltaren gel            [facility] PT Assessment - 11/17/14 1400    Assessment   Medical Diagnosis TIA   Referring Provider Nita Sells MD   Precautions   Precautions Fall   Restrictions   Weight Bearing Restrictions No   Balance Screen   Has the patient fallen in the past 6 months No   Has the patient had a decrease in activity level because of a fear of falling?  No   Is the patient reluctant to leave their home because of a fear of falling?  No   Home Environment   Living Environment Private residence    Living Arrangements Alone   Type of [place] to enter   Entrance Stairs-Number of Steps 18   Entrance Stairs-Rails Right   [place] One level   Prior Function   Level of [place] Retired   [employer]   Observation/Other Assessments   Focus on Therapeutic Outcomes (FOTO)  68 Functional Status   Stroke Impact Scale  83.3   Fear Avoidance Belief Questionnaire (FABQ)  26   Tone   Assessment Location Left Lower Extremity   ROM / Strength   AROM / PROM / Strength AROM;Strength   AROM   Overall AROM  Within functional limits for tasks performed   Strength   Overall Strength Comments Bil. LE tested in sitting 5/5   Ambulation/Gait   Ambulation/Gait Yes   Ambulation/Gait Assistance 6: Modified independent (Device/Increase time)   Ambulation Distance (Feet) 500 Feet   Assistive device None   Gait Pattern Within Functional Limits   Ambulation Surface Indoor;Level   Gait velocity 2.55 ft/sec   Stairs Yes   Stairs Assistance 6: Modified independent (Device/Increase time)   Stair Management Technique One rail Right;Alternating pattern;Forwards   Number of Stairs 4   Ramp 6: Modified independent (Device)  no device   Curb 6: Modified independent (Device/increase time)  no device   Standardized Balance Assessment   Standardized Balance Assessment Berg Balance Test;Timed Up and Go Test   Berg Balance Test   Sit to Stand Able to stand without using hands and stabilize independently   Standing Unsupported Able to stand safely 2 minutes   Sitting with Back Unsupported but Feet Supported on Floor or Stool Able to sit safely and securely 2 minutes   Stand to Sit Sits safely with minimal use of hands   Transfers Able to transfer safely, minor use of hands   Standing Unsupported with Eyes Closed Able to stand 10 seconds safely   Standing Ubsupported with Feet Together Able to place feet together independently and stand 1 minute safely    From Standing, Reach Forward with Outstretched Arm Can reach confidently >25 cm (10")   From Standing Position, Pick up Object from Floor Able to pick up shoe safely and easily   From Standing Position, Turn to Look Behind Over each Shoulder Looks behind from both sides and weight shifts well   Turn 360 Degrees Able to turn 360 degrees safely in 4 seconds or less   Standing Unsupported, Alternately Place Feet on Step/Stool Able to stand independently and safely and complete 8 steps in 20 seconds   Standing Unsupported, One  Foot in [place] to place foot tandem independently and hold 30 seconds   Standing on One Leg Able to lift leg independently and hold > 10 seconds   Total Score 56   Functional Gait  Assessment   Gait assessed  Yes   Gait Level Surface Walks 20 ft in less than 7 sec but greater than 5.5 sec, uses assistive device, slower speed, mild gait deviations, or deviates 6-10 in outside of the 12 in walkway width.   Change in Gait Speed Able to change speed, demonstrates mild gait deviations, deviates 6-10 in outside of the 12 in walkway width, or no gait deviations, unable to achieve a major change in velocity, or uses a change in velocity, or uses an assistive device.   Gait with Horizontal Head Turns Performs head turns smoothly with no change in gait. Deviates no more than 6 in outside 12 in walkway width   Gait with Vertical Head Turns Performs head turns with no change in gait. Deviates no more than 6 in outside 12 in walkway width.   Gait and Pivot Turn Pivot turns safely within 3 sec and stops quickly with no loss of balance.   Step Over Obstacle Is able to step over 2 stacked shoe boxes taped together (9 in total height) without changing gait speed. No evidence of imbalance.   Gait with Narrow Base of Support Is able to ambulate for 10 steps heel to toe with no staggering.   Gait with Eyes Closed Walks 20 ft, uses assistive device, slower speed, mild gait deviations, deviates  6-10 in outside 12 in walkway width. Ambulates 20 ft in less than 9 sec but greater than 7 sec.   Ambulating Backwards Walks 20 ft, no assistive devices, good speed, no evidence for imbalance, normal gait   Steps Alternating feet, must use rail.   Total Score 26   LUE Tone   LUE Tone Within Functional Limits                           PT Education - 12/16/14 1445    Education provided Yes   Education Details increasing activity level and returning to fitness program at [facility]) Educated Patient   Methods Explanation   Comprehension Verbalized understanding                    Plan - 12-16-2014 1445    Clinical Impression Statement This 74yo male had recent neurological deficit. He was referred to PT for evaluation. Berg Balance 56/56, Functional Gait Assessment 26/30 and independent gait all test within functional level for age with minimal fall risk. Patient does not appear to require skilled surfaces of PT at this time.    PT Frequency One time visit   PT Next Visit Plan Evaluation only   Consulted and Agree with Plan of Care Patient          G-Codes - 12-16-2014 1445    Functional Assessment Tool Used Berg Balance 56/56, Functional Gait Assessment 26/30   Functional Limitation Mobility: Walking and moving around   Mobility: Walking and Moving Around Current Status (Y6503) At least 1 percent but less than 20 percent impaired, limited or restricted   Mobility: Walking and Moving Around Goal Status (T4656) At least 1 percent but less than 20 percent impaired, limited or restricted   Mobility: Walking and Moving Around Discharge Status [phone]) At least 1 percent but less than  20 percent impaired, limited or restricted       Problem List Patient Active Problem List   Diagnosis Date Noted  . TIA (transient ischemic attack) 11/03/2014  . OSA on CPAP 05/18/2014  . White matter disease 08/14/2013  . OSA (obstructive sleep apnea) 08/14/2013  .  Unsteady gait 05/19/2013  . Mixed hyperlipidemia 04/24/2013  . Syncope 03/20/2013  . Chest pain 10/08/2012  . Hypertension   . Stroke ([place])   . History of TIAs   . Lumbago   . Other and unspecified hyperlipidemia   . Personal history of unspecified circulatory disease   . Unspecified fall   . Pain in joint, multiple sites   . Degeneration of cervical intervertebral disc   . Unspecified cardiovascular disease   . History of pulmonary embolism: June 2014,  Takes Xarelto 07/01/2012    Class: History of  . Hemiparesis ([place]) 07/18/2011  . Diabetes mellitus type 2 with complications ([place]) 43/32/9518  . HTN (hypertension) 07/18/2011  . CAD in native artery 07/18/2011  . History of recurrent TIAs 07/18/2011    Jamey Reas PT, DPT 11/18/2014, 2:24 PM  [facility] [address] [place], [place], [address] Phone: [phone]   Fax:  [phone]  Name: Tahjai Schetter MRN: [id number] Date of Birth: [birth year]

## 2014-11-18 NOTE — Patient Instructions (Addendum)
Try using the CPAP greater than 4 hours each night. Best to put the mask on a soon as you lay down to go to bed. Continue on Xarelto and aspirin. Keep regular follow-up with your primary care provider. Blood pressure goal less than 130/90 Cholesterol LDL less than 100 Hemoglobin A1c less than 6.5% If you have any stroke like symptoms please call when immediately If your symptoms worsen or you develop new symptoms please let us know. \

## 2014-11-18 NOTE — Progress Notes (Signed)
PATIENT: Steve Brown DOB: 17-May-1940  REASON FOR VISIT: follow up- obstructive sleep apnea on CPAP HISTORY FROM: patient  HISTORY OF PRESENT ILLNESS: Steve Brown is a 74 year old male with a history of obstructive sleep apnea on CPAP. He returns today for a follow-up. The patient CPAP download indicates that he uses machine 21 out of 30 days for compliance of 70%. He only uses machine 20 out of 30 days for compliance of 67%. On average he uses his machine 6 hours and 2 minutes. His residual AHI is 1.6 on 9 cm of water with EPR 1. He has a leak and the 95th percentile at 26.5 L/m. The patient was recently in the hospital for a TIA event. The patient experienced weakness in the left arm. He called EMS and the weakness resolved prior to their arrival. He was admitted to the hospital CT scan and MRI was negative. Carotid Dopplers were unremarkable. The patient had a 2-D echo in 2015- results are in [place]. The patient remains on Xarelto after having a PE. He is also on aspirin The patient's blood pressure remains under good control. His primary care manages his hypertension hyperlipidemia and diabetes. He denies any additional strokelike symptoms. He is starting physical therapy this week. He returns today for an evaluation.  HISTORY HISTORY 08/14/13 (CD): Steve Brown is meanwhile 74 years old and has been staying active. He has controlled his weight he is physically active, he also has undergone a complete course of physical therapy since last visit. After out last visit, we had obtained an MRI of the brain without contrast and compared to the study from earlier the same year : there was no change in comparison to the May CT on this MRI dated 07-04-13 .  The patient has few scattered small vessel injuries and some lacunar infarcts.  The main risk factor for these as hypertension and diabetes- Occupational therapy saw him for left arm pain. Additional risk factor is the patient's diabetes mellitus  condition. I referred him for occupational therapy to get further insight in what may have caused his arm pain.  He noted that he could left pounds with the left 3 pounds with her right hand now there is still a significant difference .  resulting in the conduction study and EMG study which was then performed on 07-09-13, normal study no evidence of large fiber neuropathy in the left upper extremity deltoid, biceps, triceps and flexor carpi radialis as well as interosseus Muscles; no abnormal EMG activity.  He underwent a sleep study at [facility] : results from 08-07-13 reviewed today :  Steve Brown was diagnosed with severe sleep apnea at an AHI of 65.9. Lowest point of oxygen saturation was 70%. He was titrated to CPAP at 9 cm water he slept 92.7 minutes at that pressure of which 23 minutes of REM sleep. The AHI was now 0.6 per hour. The previously very fragmented sleep architecture became now essentially normal. The technologist used a PIC all equal nasal mask. The patient is asked to return in about 50 days for a followup with the CPAP machine.  I also reviewed his long medication list : patient has been on Xeralto for chronic anticoagulation. UPDATE 05/18/14 Steve Brown, who was diagnosed with severe sleep apnea at an AHI of 65.9, has made efforts to increase his CPAP compliance. CPAP was initiated to reduce his secondary CVA risk.  He has used the machine over the last 30 days still only on 18 days, and  all these days over 4 hours his compliance is 53% his average user time is 6 hours and 18 minutes his EPR is 1 cm and a set pressure 9 cm water unchanged from the last year. His residual AHI was 0.9 documenting that the setting is effective. Fatigue severity score was only 18 and sleepiness score 6 points again I at contribute to the lower sleepiness score also to his use of CPAP. He stated that he had some sinusitis problems that kept him from using CPAP and has just seen Dr. Ernesto Rutherford who  prescribed no antibiotics after other measures had failed. He is reluctant to use an anti-decongestant which I would also not recommend because it can spike his blood pressure. He has Flonase which Dr. Harrington Challenger his primary care physician has prescribed for him. I will see him again in one year for CPAP compliance visit but urged him to try to use the machine every night or at least if he has a medical reason not to use it to discontinue the use for 3 or 4 days until he recovers and then start again. He has also gained some weight which is a risk factor for obstructive sleep apnea alongside his retrognathia. He is now 185 pounds.   REVIEW OF SYSTEMS: Out of a complete 14 system review of symptoms, the patient complains only of the following symptoms, and all other reviewed systems are negative.  Epworth Sleepiness Scale 12, fatigue severity score 36 Activity change, back pain  ALLERGIES: Allergies  Allergen Reactions  . Phenergan [Promethazine] Other (See Comments)    Mood changes     HOME MEDICATIONS: Outpatient Prescriptions Prior to Visit  Medication Sig Dispense Refill  . Ascorbic Acid (VITAMIN C PO) Take 1 tablet by mouth daily.    Marland Kitchen aspirin EC 81 MG tablet Take 1 tablet (81 mg total) by mouth daily.    . BD PEN NEEDLE NANO U/F 32G X 4 MM MISC     . chlorhexidine (PERIDEX) 0.12 % solution Use as directed 15 mLs in the mouth or throat 2 (two) times daily.     . cholecalciferol (VITAMIN D) 1000 UNITS tablet Take 1,000 Units by mouth daily.    . diclofenac sodium (VOLTAREN) 1 % GEL Apply 2 g topically 3 (three) times daily as needed (pain).    . DULoxetine (CYMBALTA) 60 MG capsule Take 60 mg by mouth daily.    Marland Kitchen dutasteride (AVODART) 0.5 MG capsule Take 0.5 mg by mouth daily.    . ferrous sulfate 325 (65 FE) MG tablet Take 325 mg by mouth daily with breakfast.    . fluticasone (FLONASE) 50 MCG/ACT nasal spray Place 2 sprays into both nostrils daily as needed for allergies.     . furosemide  (LASIX) 20 MG tablet Take 20 mg by mouth daily as needed (swelling).     Marland Kitchen HUMALOG MIX 75/25 KWIKPEN (75-25) 100 UNIT/ML Kwikpen Inject into the skin. 20 units in am, 18 units in pm.  1  . lactobacillus acidophilus & bulgar (LACTINEX) chewable tablet Chew 1 tablet by mouth 2 (two) times daily.  0  . metFORMIN (GLUCOPHAGE) 500 MG tablet Take 2 tablets (1,000 mg total) by mouth 2 (two) times daily with a meal. (Patient taking differently: Take 500 mg by mouth 2 (two) times daily with a meal. )    . Multiple Vitamin (MULTIVITAMIN WITH MINERALS) TABS Take 1 tablet by mouth every evening.     . nitroGLYCERIN (NITROSTAT) 0.4 MG SL tablet Place  1 tablet (0.4 mg total) under the tongue every 5 (five) minutes as needed for chest pain. 25 tablet 12  . nystatin (MYCOSTATIN) 100000 UNIT/ML suspension   3  . pantoprazole (PROTONIX) 40 MG tablet Take 40 mg by mouth daily.    . prednisoLONE acetate (PRED FORTE) 1 % ophthalmic suspension Place 1 drop into both eyes as needed.     . pregabalin (LYRICA) 300 MG capsule Take 1 capsule (300 mg total) by mouth 2 (two) times daily. 180 capsule 3  . ramipril (ALTACE) 1.25 MG capsule Take 1.25 mg by mouth daily.   4  . Rivaroxaban (XARELTO) 20 MG TABS tablet Take 1 tablet (20 mg total) by mouth daily with supper. (Patient taking differently: Take 20 mg by mouth daily. ) 30 tablet   . rosuvastatin (CRESTOR) 10 MG tablet Take 10 mg by mouth every evening.     . valACYclovir (VALTREX) 500 MG tablet Take 500 mg by mouth daily.     No facility-administered medications prior to visit.    PAST MEDICAL HISTORY: Past Medical History  Diagnosis Date  . Diabetes mellitus   . Coronary artery disease   . Hypertension   . History of TIAs   . Lumbago   . Benign localized hyperplasia of prostate without urinary obstruction and other lower urinary tract symptoms (LUTS)   . Other and unspecified hyperlipidemia   . Unspecified essential hypertension   . Type II or unspecified type  diabetes mellitus without mention of complication, not stated as uncontrolled   . Personal history of unspecified circulatory disease   . Unspecified fall   . Hypersomnia with sleep apnea, unspecified   . Pain in joint, multiple sites   . Degeneration of cervical intervertebral disc   . Unspecified cardiovascular disease   . Unsteady gait 05/19/2013    X 24 hours - improving. Hx prior CVA/TIAs; multiple risk factors; on Xarelto  . Stroke [facility]) 2002, 2003    both sided weakness  . Trigger finger of both hands 11-17-13    PAST SURGICAL HISTORY: Past Surgical History  Procedure Laterality Date  . Hernia repair    . Carpal tunnel release      bilateral  . Elbow surgery      right elbow - nerve release  . Cervical disc surgery    . Lungs      "fluid pumped off lungs"  . Coronary stent placement  Feb. 2010  . Cardiac catheterization    . Coronary angioplasty    . Left heart catheterization with coronary angiogram N/A 04/03/2013    Procedure: LEFT HEART CATHETERIZATION WITH CORONARY ANGIOGRAM;  Surgeon: Jettie Booze, MD;  Location: [facility] CATH LAB;  Service: Cardiovascular;  Laterality: N/A;    FAMILY HISTORY: Family History  Problem Relation Age of Onset  . Aneurysm Mother   . Cancer Father     SOCIAL HISTORY: Social History   Social History  . Marital Status: Divorced    Spouse Name: N/A  . Number of Children: 4  . Years of Education: 12   Occupational History  .      retired   Social History Main Topics  . Smoking status: Never Smoker   . Smokeless tobacco: Never Used  . Alcohol Use: No  . Drug Use: No  . Sexual Activity: Not on file   Other Topics Concern  . Not on file   Social History Narrative   Patient lives at home alone and he is single.  Patient is retired.    Caffeine - one cups daily.   Right handed.   Patient has a high school education.   Patient has four adult children.      PHYSICAL EXAM  Filed Vitals:   11/18/14 1102  BP: 121/69    Pulse: 69  Height: 5\' 5"  (1.651 m)  Weight: 190 lb (86.183 kg)   Body mass index is 31.62 kg/(m^2).  Generalized: Well developed, in no acute distress  Neck: Circumference 15-1/2 inches, Mallampati 3+  Neurological examination  Mentation: Alert oriented to time, place, history taking. Follows all commands speech and language fluent Cranial nerve II-XII: Pupils were equal round reactive to light. Extraocular movements were full, visual field were full on confrontational test. Facial sensation and strength were normal. Uvula tongue midline. Head turning and shoulder shrug  were normal and symmetric. Motor: The motor testing reveals 5 over 5 strength of all 4 extremities. Good symmetric motor tone is noted throughout.  Sensory: Sensory testing is intact to soft touch on all 4 extremities. No evidence of extinction is noted.  Coordination: Cerebellar testing reveals good finger-nose-finger and heel-to-shin bilaterally.  Gait and station: Gait is normal. Tandem gait is normal. Romberg is negative. No drift is seen.  Reflexes: Deep tendon reflexes are symmetric and normal bilaterally.   DIAGNOSTIC DATA (LABS, IMAGING, TESTING) - I reviewed patient records, labs, notes, testing and imaging myself where available.  Lab Results  Component Value Date   WBC 4.7 11/03/2014   HGB 12.7* 11/03/2014   HCT 37.0* 11/03/2014   MCV 88.9 11/03/2014   PLT 196 11/03/2014      Component Value Date/Time   NA 138 11/03/2014 1525   NA 140 08/14/2012 1513   K 4.5 11/03/2014 1525   K 4.3 08/14/2012 1513   CL 105 11/03/2014 1525   CO2 28 11/03/2014 1525   CO2 27 08/14/2012 1513   GLUCOSE 222* 11/03/2014 1525   GLUCOSE 262* 08/14/2012 1513   BUN 12 11/03/2014 1525   BUN 19.0 08/14/2012 1513   CREATININE 1.06 11/03/2014 1525   CREATININE 1.29 03/19/2014 1130   CREATININE 1.3 08/14/2012 1513   CALCIUM 9.2 11/03/2014 1525   CALCIUM 9.3 08/14/2012 1513   PROT 5.9* 11/03/2014 1525   PROT 6.9 08/14/2012  1513   ALBUMIN 3.2* 11/03/2014 1525   ALBUMIN 3.4* 08/14/2012 1513   AST 22 11/03/2014 1525   AST 15 08/14/2012 1513   ALT 21 11/03/2014 1525   ALT 14 08/14/2012 1513   ALKPHOS 63 11/03/2014 1525   ALKPHOS 92 08/14/2012 1513   BILITOT 0.5 11/03/2014 1525   BILITOT 0.39 08/14/2012 1513   GFRNONAA >60 11/03/2014 1525   GFRNONAA 67 05/19/2013 1108   GFRAA >60 11/03/2014 1525   GFRAA 78 05/19/2013 1108   Lab Results  Component Value Date   CHOL 144 03/21/2013   HDL 57 03/21/2013   LDLCALC 63 03/21/2013   TRIG 122 03/21/2013   CHOLHDL 2.5 03/21/2013   Lab Results  Component Value Date   HGBA1C 12.2* 03/20/2013    Lab Results  Component Value Date   TSH 0.618 03/20/2013      ASSESSMENT AND PLAN 74 y.o. year old male  has a past medical history of Diabetes mellitus; Coronary artery disease; Hypertension; History of TIAs; Lumbago; Benign localized hyperplasia of prostate without urinary obstruction and other lower urinary tract symptoms (LUTS); Other and unspecified hyperlipidemia; Unspecified essential hypertension; Type II or unspecified type diabetes mellitus without mention of complication, not  stated as uncontrolled; Personal history of unspecified circulatory disease; Unspecified fall; Hypersomnia with sleep apnea, unspecified; Pain in joint, multiple sites; Degeneration of cervical intervertebral disc; Unspecified cardiovascular disease; Unsteady gait (05/19/2013); Stroke [facility]) (2002, 2003); and Trigger finger of both hands (11-17-13). here with:  1. Obstructive sleep apnea on CPAP 2. History of TIA event  I have encouraged patient to use his CPAP more often. He should put the mask on a soon as he goes to bed. The patient's residual AHI is under good control. The patient did have a TIA event. He will remain on Xarelto and aspirin. Patient should maintain strict control of his blood pressure with goal less than 130/90. Cholesterol LDL less than 100 and hemoglobin A1c less than  6.5%. Keep regular follow-ups with PCP. Patient advised that if he has any strokelike symptoms he should call 911 immediately. He will follow-up in 6 months or sooner if needed.   Ward Givens, MSN, NP-C 11/18/2014, 10:59 AM [facility] [address] [place], [place] [address] [phone]

## 2014-11-19 ENCOUNTER — Encounter: Payer: Self-pay | Admitting: Occupational Therapy

## 2014-11-19 ENCOUNTER — Ambulatory Visit: Payer: Medicare Other | Admitting: Occupational Therapy

## 2014-11-19 DIAGNOSIS — M79601 Pain in right arm: Secondary | ICD-10-CM | POA: Diagnosis not present

## 2014-11-19 DIAGNOSIS — I739 Peripheral vascular disease, unspecified: Secondary | ICD-10-CM | POA: Diagnosis not present

## 2014-11-19 DIAGNOSIS — L603 Nail dystrophy: Secondary | ICD-10-CM | POA: Diagnosis not present

## 2014-11-19 DIAGNOSIS — E1051 Type 1 diabetes mellitus with diabetic peripheral angiopathy without gangrene: Secondary | ICD-10-CM | POA: Diagnosis not present

## 2014-11-19 DIAGNOSIS — M6281 Muscle weakness (generalized): Secondary | ICD-10-CM | POA: Diagnosis not present

## 2014-11-19 DIAGNOSIS — R6889 Other general symptoms and signs: Secondary | ICD-10-CM | POA: Diagnosis not present

## 2014-11-19 NOTE — Therapy (Signed)
[facility] [address] [place], [place], [address] Phone: [phone]   Fax:  [phone]  Occupational Therapy Evaluation  Patient Details  Name: Steve Brown MRN: [id number] Date of Birth: 1940-05-14 Referring Provider: Dr. Nita Sells  Encounter Date: 11/17/2014      OT End of Session - 11/19/14 1001    Visit Number 1   Number of Visits 17   Date for OT Re-Evaluation 01/16/15   Authorization Type Medicare, G-code needed   Authorization - Visit Number 1   Authorization - Number of Visits 10   OT Start Time 1017   OT Stop Time 1100   OT Time Calculation (min) 43 min   Activity Tolerance Patient tolerated treatment well   Behavior During Therapy [facility] for tasks assessed/performed      Past Medical History  Diagnosis Date  . Diabetes mellitus   . Coronary artery disease   . Hypertension   . History of TIAs   . Lumbago   . Benign localized hyperplasia of prostate without urinary obstruction and other lower urinary tract symptoms (LUTS)   . Other and unspecified hyperlipidemia   . Unspecified essential hypertension   . Type II or unspecified type diabetes mellitus without mention of complication, not stated as uncontrolled   . Personal history of unspecified circulatory disease   . Unspecified fall   . Hypersomnia with sleep apnea, unspecified   . Pain in joint, multiple sites   . Degeneration of cervical intervertebral disc   . Unspecified cardiovascular disease   . Unsteady gait 05/19/2013    X 24 hours - improving. Hx prior CVA/TIAs; multiple risk factors; on Xarelto  . Stroke [facility]) 2002, 2003    both sided weakness  . Trigger finger of both hands 11-17-13    Past Surgical History  Procedure Laterality Date  . Hernia repair    . Carpal tunnel release      bilateral  . Elbow surgery      right elbow - nerve release  . Cervical disc surgery    . Lungs      "fluid pumped off lungs"  .  Coronary stent placement  Feb. 2010  . Cardiac catheterization    . Coronary angioplasty    . Left heart catheterization with coronary angiogram N/A 04/03/2013    Procedure: LEFT HEART CATHETERIZATION WITH CORONARY ANGIOGRAM;  Surgeon: Jettie Booze, MD;  Location: [facility] CATH LAB;  Service: Cardiovascular;  Laterality: N/A;    There were no vitals filed for this visit.  Visit Diagnosis:  Generalized muscle weakness  Decreased activity tolerance  Pain of right arm      Subjective Assessment - 11/19/14 0943    Subjective  "The (fludio) machine really helped last time.  That's one of the reasons that I came here"   Pertinent History hx of multiple CVAs and TIAs   Patient Stated Goals improve LUE functional use/strength, return to [facility] activities   Currently in Pain? Yes   Pain Score 8   6-7/10 prior to recent TIA, 4/10 at best   Pain Location Arm   Pain Orientation Left   Pain Descriptors / Indicators Pins and needles;Tingling;Sharp  heavy   Pain Type Chronic pain   Pain Onset More than a month ago   Pain Frequency Constant   Aggravating Factors  overuse   Pain Relieving Factors heat, tens, voltaren gel, exercise   Effect of Pain on Daily Activities limits activity   Multiple Pain Sites  Yes   Pain Score 9   Pain Location Back   Pain Orientation Upper;Mid;Lower   Pain Descriptors / Indicators Burning;Aching;Stabbing   Pain Type Chronic pain   Pain Onset More than a month ago   Pain Frequency Constant   Aggravating Factors  walking, bending, standing, getting up from recliner   Pain Relieving Factors TENS unit, voltaren gel   Effect of Pain on Daily Activities affects ADLs, OT will monitor as realates to treatment, but will not directly address due to chronic nature           [facility] OT Assessment - 11/19/14 0001    Assessment   Diagnosis TIA (Transient cerebral ischemias)   Referring Provider Dr. Nita Sells   Onset Date 11/03/14   Assessment hospitalized  11/03/14-11/05/14   Prior Therapy previously for past TIAs/CVA (pt unsure of date)   Precautions   Precautions Fall   Balance Screen   Has the patient fallen in the past 6 months No   Home  Environment   Family/patient expects to be discharged to: Private residence   Lives With Alone   Prior Function   Level of Independence Independent with basic ADLs;Independent with homemaking with ambulation   Vocation Retired   Leisure has not returned to the [facility] for exercise.  Pt was going 4-5 days/week (cardio, light weights, silver sneakers)   ADL   ADL comments Pt reports performing BADLs mod I.  unable to use L hand to open jars, difficulty gripping things, unable to carry groceries in L hand, unable to use L hand to cut food   IADL   Shopping --  mod I, unable to carry groceries in LUE   Light Housekeeping --  mod I   Prior Level of Function Meal Prep goes out a lot   Meal Prep --  mod I   Community Mobility Drives own vehicle   Medication Management Is responsible for taking medication in correct dosages at correct time   Physiological scientist financial matters independently (budgets, writes checks, pays rent, bills goes to bank), collects and keeps track of income   Mobility   Mobility Status Independent   Written Expression   Dominant Hand Right   Vision - History   Baseline Vision Wears glasses only for reading   Additional Comments reports mild blurriness occasionally, but close to baseline per pt report   Activity Tolerance   Activity Tolerance Comments Pt reports that he is uanble to pick up 80 y.o. grandson from school and play with him as he does not feel like he can physical perform.  Pt reports that he had someone folding clothes for him prior to this TIA.   Cognition   Overall Cognitive Status History of cognitive impairments - at baseline  pt reports mild changes, but close to baseline   Memory Impaired  using compensation strategies--"I wasn't sharp before"    Observation/Other Assessments   Other Surveys  Select   Upper Extremity Functional Index  32/80   Sensation   Additional Comments Pt reports decr sensation   Coordination   9 Hole Peg Test Right;Left   Right 9 Hole Peg Test 25.66   Left 9 Hole Peg Test 30.72   ROM / Strength   AROM / PROM / Strength AROM;Strength   AROM   Overall AROM  Within functional limits for tasks performed   Strength   Overall Strength Comments Proximal LUE strength grossly 3+/5 to 4-/5, RUE proximal strength grossly 4+ to 5/5  Hand Function   Right Hand Grip (lbs) 42   Left Hand Grip (lbs) 45   LUE Tone   LUE Tone Within Functional Limits                           OT Short Term Goals - 11/19/14 1008    OT SHORT TERM GOAL #1   Title Pt will be independent with initial HEP for LUE strength.--check STGs 12/16/14   Time 4   Period Weeks   Status New   OT SHORT TERM GOAL #2   Title Pt will be able to retrieve 2-3lb object from overhead shelf with good safety at least 3x.   Time 4   Period Weeks   Status New   OT SHORT TERM GOAL #3   Title Pt will report LUE pain less than or equal to 7/10 consistently during ADLs.   Time 4   Period Weeks   Status New   OT SHORT TERM GOAL #4   Title Pt will verbalize understanding of AE/strategies for ADLs prn (including energy conservation strategies).   Time 4   Period Weeks   Status New   OT SHORT TERM GOAL #5   Title Pt will be able to cut food using LUE as assist.   Time 4   Period Weeks   Status New           OT Long Term Goals - 11/19/14 1011    OT LONG TERM GOAL #1   Title Pt will be independent with updated HEP for LUE strengthening.--check LTGs 01/15/15   Time 8   Period Weeks   Status New   OT LONG TERM GOAL #2   Title Pt will improve LUE functional use as shown by improving score on Upper Extremity Functional Scale to at least 45/80.   Baseline 32/80   Time 8   Period Weeks   Status New   OT LONG TERM GOAL #3   Title  Pt will demo ability to perform physical activity for at least 65min in prep for playing with 5 y.o. grandwon.   Time 8   Period Weeks   Status New   OT LONG TERM GOAL #4   Title Pt will return to previous community fitness activities with min difficulty.   Time 8   Period Weeks   Status New   OT LONG TERM GOAL #5   Title Pt will be able to carry 5lb bag of groceries in LUE for at least 45feet safely.   Time 8   Period Weeks   Status New               Plan - 11/19/14 1002    Clinical Impression Statement Pt is a 74 y.o. male s/p recent hospitalization for TIA.  Pt reports hx of arthritis and multiple TIAs/CVA in the past.  Pt presents today with decreased strength/activity tolerance, decr LUE functional use, and chronic pain (worse per pt).  Pt would benefit from occupational therapy to improve LUE functional use, ADL/IADL performance, and prepare pt to return to previous community fitness activities.   Pt will benefit from skilled therapeutic intervention in order to improve on the following deficits (Retired) Decreased cognition;Decreased mobility;Decreased strength;Impaired UE functional use;Pain;Decreased knowledge of use of DME;Decreased activity tolerance;Decreased endurance;Impaired sensation   Rehab Potential Good   OT Frequency 2x / week   OT Duration 8 weeks  +eval, but may modify depending on progress  OT Treatment/Interventions Moist Heat;Passive range of motion;Therapeutic activities;DME and/or AE instruction;Therapeutic exercises;Neuromuscular education;Therapeutic exercise;Functional Mobility Training;Patient/family education;Self-care/ADL training;Ultrasound;Manual Therapy;Splinting;Energy conservation;Electrical Stimulation;Fluidtherapy   Plan initiate HEP for LUE strength   Consulted and Agree with Plan of Care Patient        Problem List Patient Active Problem List   Diagnosis Date Noted  . TIA (transient ischemic attack) 11/03/2014  . OSA on CPAP  05/18/2014  . White matter disease 08/14/2013  . OSA (obstructive sleep apnea) 08/14/2013  . Unsteady gait 05/19/2013  . Mixed hyperlipidemia 04/24/2013  . Syncope 03/20/2013  . Chest pain 10/08/2012  . Hypertension   . Stroke ([place])   . History of TIAs   . Lumbago   . Other and unspecified hyperlipidemia   . Personal history of unspecified circulatory disease   . Unspecified fall   . Pain in joint, multiple sites   . Degeneration of cervical intervertebral disc   . Unspecified cardiovascular disease   . History of pulmonary embolism: June 2014,  Takes Xarelto 07/01/2012    Class: History of  . Hemiparesis ([place]) 07/18/2011  . Diabetes mellitus type 2 with complications ([place]) 99991111  . HTN (hypertension) 07/18/2011  . CAD in native artery 07/18/2011  . History of recurrent TIAs 07/18/2011    [facility] 11/19/2014, 10:26 AM  [place] [address] [place], [place], [address] Phone: [phone]   Fax:  [phone]  Name: Daulton Britts MRN: [id number] Date of Birth: 1940-04-08  Vianne Bulls, OTR/L 11/19/2014 10:26 AM

## 2014-11-19 NOTE — Therapy (Signed)
[facility] [address] [place], [place], [address] Phone: [phone]   Fax:  [phone]  Occupational Therapy Treatment  Patient Details  Name: Steve Brown MRN: [id number] Date of Birth: Jul 29, 1940 Referring Provider: Dr. Nita Sells  Encounter Date: 11/19/2014      OT End of Session - 11/19/14 1529    Visit Number 2   Number of Visits 17   Date for OT Re-Evaluation 01/16/15   Authorization Type Medicare, G-code needed   Authorization - Visit Number 2   Authorization - Number of Visits 10   OT Start Time 1440   OT Stop Time 1530   OT Time Calculation (min) 50 min   Activity Tolerance Patient tolerated treatment well      Past Medical History  Diagnosis Date  . Diabetes mellitus   . Coronary artery disease   . Hypertension   . History of TIAs   . Lumbago   . Benign localized hyperplasia of prostate without urinary obstruction and other lower urinary tract symptoms (LUTS)   . Other and unspecified hyperlipidemia   . Unspecified essential hypertension   . Type II or unspecified type diabetes mellitus without mention of complication, not stated as uncontrolled   . Personal history of unspecified circulatory disease   . Unspecified fall   . Hypersomnia with sleep apnea, unspecified   . Pain in joint, multiple sites   . Degeneration of cervical intervertebral disc   . Unspecified cardiovascular disease   . Unsteady gait 05/19/2013    X 24 hours - improving. Hx prior CVA/TIAs; multiple risk factors; on Xarelto  . Stroke [facility]) 2002, 2003    both sided weakness  . Trigger finger of both hands 11-17-13    Past Surgical History  Procedure Laterality Date  . Hernia repair    . Carpal tunnel release      bilateral  . Elbow surgery      right elbow - nerve release  . Cervical disc surgery    . Lungs      "fluid pumped off lungs"  . Coronary stent placement  Feb. 2010  . Cardiac  catheterization    . Coronary angioplasty    . Left heart catheterization with coronary angiogram N/A 04/03/2013    Procedure: LEFT HEART CATHETERIZATION WITH CORONARY ANGIOGRAM;  Surgeon: Jettie Booze, MD;  Location[facility] CATH LAB;  Service: Cardiovascular;  Laterality: N/A;    There were no vitals filed for this visit.  Visit Diagnosis:  Decreased functional activity tolerance  Generalized muscle weakness      Subjective Assessment - 11/19/14 1446    Pertinent History hx of multiple CVAs and TIAs   Patient Stated Goals improve LUE functional use/strength, return to [facility] activities   Currently in Pain? Yes   Pain Score 8    Pain Location Arm   Pain Orientation Left   Pain Descriptors / Indicators Pins and needles;Tingling;Sharp   Pain Type Chronic pain   Pain Onset More than a month ago   Pain Frequency Constant   Aggravating Factors  overuse   Pain Relieving Factors heat, tens, voltaren gel, exercise                OT Treatments/Exercises (OP) - 11/19/14 0001    ADLs   ADL Comments Energy conservation techniques issued and reviewed. see pt instructions   Exercises   Exercises Shoulder   Shoulder Exercises: ROM/Strengthening   Other ROM/Strengthening Exercises Theraband HEP issued - pt  performed each x 10 reps (see pt instructions)   Modalities   Modalities Fluidotherapy   RUE Fluidotherapy   Number Minutes Fluidotherapy 12 Minutes   RUE Fluidotherapy Location Hand;Wrist   Comments at beginning of session to reduce pain (simultaneously with Lt hand per pt request)   LUE Fluidotherapy   Number Minutes Fluidotherapy 12 Minutes   LUE Fluidotherapy Location Hand;Wrist   Comments at beginning of session to reduce pain                OT Education - 11/19/14 1451    Education provided Yes   Education Details Theraband HEP, energy conservation technqiues   Person(s) Educated Patient   Methods Explanation;Demonstration;Handout   Comprehension  Verbalized understanding;Returned demonstration          OT Short Term Goals - 11/19/14 1008    OT SHORT TERM GOAL #1   Title Pt will be independent with initial HEP for LUE strength.--check STGs 12/16/14   Time 4   Period Weeks   Status New   OT SHORT TERM GOAL #2   Title Pt will be able to retrieve 2-3lb object from overhead shelf with good safety at least 3x.   Time 4   Period Weeks   Status New   OT SHORT TERM GOAL #3   Title Pt will report LUE pain less than or equal to 7/10 consistently during ADLs.   Time 4   Period Weeks   Status New   OT SHORT TERM GOAL #4   Title Pt will verbalize understanding of AE/strategies for ADLs prn (including energy conservation strategies).   Time 4   Period Weeks   Status New   OT SHORT TERM GOAL #5   Title Pt will be able to cut food using LUE as assist.   Time 4   Period Weeks   Status New           OT Long Term Goals - 11/19/14 1011    OT LONG TERM GOAL #1   Title Pt will be independent with updated HEP for LUE strengthening.--check LTGs 01/15/15   Time 8   Period Weeks   Status New   OT LONG TERM GOAL #2   Title Pt will improve LUE functional use as shown by improving score on Upper Extremity Functional Scale to at least 45/80.   Baseline 32/80   Time 8   Period Weeks   Status New   OT LONG TERM GOAL #3   Title Pt will demo ability to perform physical activity for at least 28min in prep for playing with 5 y.o. grandwon.   Time 8   Period Weeks   Status New   OT LONG TERM GOAL #4   Title Pt will return to previous community fitness activities with min difficulty.   Time 8   Period Weeks   Status New   OT LONG TERM GOAL #5   Title Pt will be able to carry 5lb bag of groceries in LUE for at least 77feet safely.   Time 8   Period Weeks   Status New               Plan - 11/19/14 1530    Clinical Impression Statement Pt progressing towards STG's and UE strength/endurance   Plan review theraband HEP prn, A/E  options for opening jars, practice cutting, etc   Consulted and Agree with Plan of Care Patient        Problem List Patient  Active Problem List   Diagnosis Date Noted  . TIA (transient ischemic attack) 11/03/2014  . OSA on CPAP 05/18/2014  . White matter disease 08/14/2013  . OSA (obstructive sleep apnea) 08/14/2013  . Unsteady gait 05/19/2013  . Mixed hyperlipidemia 04/24/2013  . Syncope 03/20/2013  . Chest pain 10/08/2012  . Hypertension   . Stroke ([place])   . History of TIAs   . Lumbago   . Other and unspecified hyperlipidemia   . Personal history of unspecified circulatory disease   . Unspecified fall   . Pain in joint, multiple sites   . Degeneration of cervical intervertebral disc   . Unspecified cardiovascular disease   . History of pulmonary embolism: June 2014,  Takes Xarelto 07/01/2012    Class: History of  . Hemiparesis ([place]) 07/18/2011  . Diabetes mellitus type 2 with complications ([place]) 99991111  . HTN (hypertension) 07/18/2011  . CAD in native artery 07/18/2011  . History of recurrent TIAs 07/18/2011    Carey Bullocks, OTR/L 11/19/2014, 3:34 PM  [facility] [address] [place], [place], [address] Phone: [phone]   Fax:  [phone]  Name: Lacorey Nadeem MRN: [id number] Date of Birth: 09-Jan-1941

## 2014-11-19 NOTE — Patient Instructions (Addendum)
  Strengthening: Resisted Flexion   Hold tubing with __Lt___ arm(s) at side. Pull forward and up. Move shoulder through pain-free range of motion. Repeat __10__ times per set.  Do _1-2_ sessions per day , every other day   Strengthening: Resisted Extension   Hold tubing in __Lt___ hand(s), arm forward. Pull arm back, elbow straight. Repeat _10___ times per set. Do _1-2___ sessions per day, every other day.   Resisted Horizontal Abduction: Bilateral   Sit or stand, tubing in both hands, arms out in front. Keeping arms straight, pinch shoulder blades together and stretch arms out. Repeat _10___ times per set. Do _1-2___ sessions per day, every other day.   Elbow Flexion: Resisted   With tubing held in ___Lt___ hand(s) and other end secured under foot, curl arm up as far as possible. Repeat _10___ times per set. Do _1-2___ sessions per day, every other day.    Elbow Extension: Resisted   Sit in chair with resistive band held at shoulder level Rt hand, and ___Lt ____ elbow bent. Pull down with Lt hand to straighten elbow. Repeat _10___ times per set.  Do _1-2___ sessions per day, every other day.     Energy Conservation Techniques  1. Sit for as many activities as possible. 2. Use slow, smooth movements.  Rushing increases discomfort. 3. Determine the necessity of performing the task.  Simplify those tasks that are necessary.  (Get clothes out of the dryer when they are warm instead of ironing, let dishes air dry, etc.) 4. Take frequent rests both during and between activities.  Avoid repetitive tasks. 5. Pre-plan your activities; try a daily and/or weekly schedule.  Spread out the activities that are most fatiguing (break up cleaning tasks over multiple days). 6. Remember to plan a balance of work, rest and recreation. 7. Consider the best time for each activity.  Do the most exertive task when you have the most energy. 8. Don't carry items if you can push them.  Slide,  don't lift. Push, don't pull. 9. Utilize two hands when appropriate. 10. Maintain good posture and use proper body mechanics.  Avoid remaining in one position for too long.  When lifting, bend at the knees, not at the waist.  Exhale when bending down, inhale when straightening up.  Carry objects as close to your body and as near to the center of the pelvis.  11. Avoid wasted body movements (position yourself for the task so that you avoid bending, twisting, etc.when possible). 12. Select the best working environment.  Consider lighting, ventilation, clothing, and equipment. 46. Organize your storage areas, making the items you use daily convenient.  Store heaviest items at waist height.  Store frequently used items between shoulders and knee height.  Consider leaving frequently used items on countertops.  (You can organize in storage baskets based on time used/purpose). 14. Feelings and emotions can be real causes of fatigue.  Try to avoid unnecessary worry, irritation, or  frustration.  Avoid stress, it can also be a source of fatigue. 15. Get help from other people for difficult tasks. 16. Explore equipment or items that may be able to do the job for you with greater ease.  (Electric can openers, blenders, lightweight items for cleaning, etc.)

## 2014-11-20 ENCOUNTER — Ambulatory Visit: Payer: Medicare Other | Admitting: Rehabilitative and Restorative Service Providers"

## 2014-11-25 DIAGNOSIS — M7989 Other specified soft tissue disorders: Secondary | ICD-10-CM | POA: Diagnosis not present

## 2014-11-25 DIAGNOSIS — M2550 Pain in unspecified joint: Secondary | ICD-10-CM | POA: Diagnosis not present

## 2014-12-08 ENCOUNTER — Ambulatory Visit: Payer: Medicare Other | Admitting: Occupational Therapy

## 2014-12-08 DIAGNOSIS — M79601 Pain in right arm: Secondary | ICD-10-CM | POA: Diagnosis not present

## 2014-12-08 DIAGNOSIS — M6281 Muscle weakness (generalized): Secondary | ICD-10-CM

## 2014-12-08 DIAGNOSIS — R6889 Other general symptoms and signs: Secondary | ICD-10-CM | POA: Diagnosis not present

## 2014-12-08 NOTE — Therapy (Signed)
[facility] [address] [place], [place], [address] Phone: [phone]   Fax:  [phone]  Occupational Therapy Treatment  Patient Details  Name: Steve Brown MRN: [id number] Date of Birth: Nov 05, 1940 Referring Provider: Dr. Nita Sells  Encounter Date: 12/08/2014      OT End of Session - 12/08/14 1327    Visit Number 3   Number of Visits 17   Date for OT Re-Evaluation 01/16/15   Authorization Type Medicare, G-code needed   Authorization - Visit Number 3   Authorization - Number of Visits 10   OT Start Time [id number]   OT Stop Time 1400   OT Time Calculation (min) 43 min   Activity Tolerance Patient tolerated treatment well   Behavior During Therapy [facility] for tasks assessed/performed      Past Medical History  Diagnosis Date  . Diabetes mellitus   . Coronary artery disease   . Hypertension   . History of TIAs   . Lumbago   . Benign localized hyperplasia of prostate without urinary obstruction and other lower urinary tract symptoms (LUTS)   . Other and unspecified hyperlipidemia   . Unspecified essential hypertension   . Type II or unspecified type diabetes mellitus without mention of complication, not stated as uncontrolled   . Personal history of unspecified circulatory disease   . Unspecified fall   . Hypersomnia with sleep apnea, unspecified   . Pain in joint, multiple sites   . Degeneration of cervical intervertebral disc   . Unspecified cardiovascular disease   . Unsteady gait 05/19/2013    X 24 hours - improving. Hx prior CVA/TIAs; multiple risk factors; on Xarelto  . Stroke [facility]) 2002, 2003    both sided weakness  . Trigger finger of both hands 11-17-13    Past Surgical History  Procedure Laterality Date  . Hernia repair    . Carpal tunnel release      bilateral  . Elbow surgery      right elbow - nerve release  . Cervical disc surgery    . Lungs      "fluid pumped off lungs"  .  Coronary stent placement  Feb. 2010  . Cardiac catheterization    . Coronary angioplasty    . Left heart catheterization with coronary angiogram N/A 04/03/2013    Procedure: LEFT HEART CATHETERIZATION WITH CORONARY ANGIOGRAM;  Surgeon: Jettie Booze, MD;  Location: [facility] CATH LAB;  Service: Cardiovascular;  Laterality: N/A;    There were no vitals filed for this visit.  Visit Diagnosis:  Decreased functional activity tolerance  Generalized muscle weakness  Decreased activity tolerance  Pain of right arm      Subjective Assessment - 12/08/14 1325    Pertinent History hx of multiple CVAs and TIAs   Patient Stated Goals improve LUE functional use/strength, return to [facility] activities   Currently in Pain? Yes   Pain Score 9    Pain Location Arm   Pain Orientation Left   Pain Descriptors / Indicators Pins and needles;Tingling;Sharp   Pain Type Chronic pain   Pain Onset More than a month ago   Pain Frequency Constant   Aggravating Factors  overuse   Pain Relieving Factors heat      Treatment: Fluidotherapy x 9 mins to bilateral UE's due to pain and stiffness(RUE per pt request), hot pack applied to left shoulder simultaneously, no adverse reactions.  Reviewed red theraband exercises, 10-20 reps each, min v.c. Arm bike x 5 mins  level 5 for conditioning. Pt practiced fastening buttons with buttonhook following instructions, and pt performed simulated cutting of meat using foam grip on fork.Pt reported increased ease and he was provided with foam grip for home use.                          OT Short Term Goals - 11/19/14 1008    OT SHORT TERM GOAL #1   Title Pt will be independent with initial HEP for LUE strength.--check STGs 12/16/14   Time 4   Period Weeks   Status New   OT SHORT TERM GOAL #2   Title Pt will be able to retrieve 2-3lb object from overhead shelf with good safety at least 3x.   Time 4   Period Weeks   Status New   OT SHORT TERM GOAL #3    Title Pt will report LUE pain less than or equal to 7/10 consistently during ADLs.   Time 4   Period Weeks   Status New   OT SHORT TERM GOAL #4   Title Pt will verbalize understanding of AE/strategies for ADLs prn (including energy conservation strategies).   Time 4   Period Weeks   Status New   OT SHORT TERM GOAL #5   Title Pt will be able to cut food using LUE as assist.   Time 4   Period Weeks   Status New           OT Long Term Goals - 11/19/14 1011    OT LONG TERM GOAL #1   Title Pt will be independent with updated HEP for LUE strengthening.--check LTGs 01/15/15   Time 8   Period Weeks   Status New   OT LONG TERM GOAL #2   Title Pt will improve LUE functional use as shown by improving score on Upper Extremity Functional Scale to at least 45/80.   Baseline 32/80   Time 8   Period Weeks   Status New   OT LONG TERM GOAL #3   Title Pt will demo ability to perform physical activity for at least 26min in prep for playing with 7 y.o. grandwon.   Time 8   Period Weeks   Status New   OT LONG TERM GOAL #4   Title Pt will return to previous community fitness activities with min difficulty.   Time 8   Period Weeks   Status New   OT LONG TERM GOAL #5   Title Pt will be able to carry 5lb bag of groceries in LUE for at least 49feet safely.   Time 8   Period Weeks   Status New               Plan - 12/08/14 1357    Clinical Impression Statement Pt is progressing towards goals for UE strength and functional use.   Pt will benefit from skilled therapeutic intervention in order to improve on the following deficits (Retired) Decreased cognition;Decreased mobility;Decreased strength;Impaired UE functional use;Pain;Decreased knowledge of use of DME;Decreased activity tolerance;Decreased endurance;Impaired sensation   Rehab Potential Good   OT Frequency 2x / week   OT Duration 8 weeks   OT Treatment/Interventions Moist Heat;Passive range of motion;Therapeutic activities;DME  and/or AE instruction;Therapeutic exercises;Neuromuscular education;Therapeutic exercise;Functional Mobility Training;Patient/family education;Self-care/ADL training;Ultrasound;Manual Therapy;Splinting;Energy conservation;Electrical Stimulation;Fluidtherapy   Plan address LUE functional use/ strength   Consulted and Agree with Plan of Care Patient        Problem List Patient Active  Problem List   Diagnosis Date Noted  . TIA (transient ischemic attack) 11/03/2014  . OSA on CPAP 05/18/2014  . White matter disease 08/14/2013  . OSA (obstructive sleep apnea) 08/14/2013  . Unsteady gait 05/19/2013  . Mixed hyperlipidemia 04/24/2013  . Syncope 03/20/2013  . Chest pain 10/08/2012  . Hypertension   . Stroke ([place])   . History of TIAs   . Lumbago   . Other and unspecified hyperlipidemia   . Personal history of unspecified circulatory disease   . Unspecified fall   . Pain in joint, multiple sites   . Degeneration of cervical intervertebral disc   . Unspecified cardiovascular disease   . History of pulmonary embolism: June 2014,  Takes Xarelto 07/01/2012    Class: History of  . Hemiparesis ([place]) 07/18/2011  . Diabetes mellitus type 2 with complications ([place]) 99991111  . HTN (hypertension) 07/18/2011  . CAD in native artery 07/18/2011  . History of recurrent TIAs 07/18/2011    Frederica Chrestman 12/08/2014, 1:59 PM Theone Murdoch, OTR/L [phone] Phone: [phone] 1:59 PM 12/08/2014 [facility] [address] [place], [place], [address] Phone: [phone]   Fax:  [phone]  Name: Steve Brown MRN: [id number] Date of Birth: 07-Oct-1940

## 2014-12-11 ENCOUNTER — Ambulatory Visit: Payer: Medicare Other | Attending: Family Medicine | Admitting: *Deleted

## 2014-12-11 DIAGNOSIS — M79601 Pain in right arm: Secondary | ICD-10-CM | POA: Insufficient documentation

## 2014-12-11 DIAGNOSIS — R6889 Other general symptoms and signs: Secondary | ICD-10-CM | POA: Diagnosis not present

## 2014-12-11 DIAGNOSIS — M6281 Muscle weakness (generalized): Secondary | ICD-10-CM | POA: Insufficient documentation

## 2014-12-11 DIAGNOSIS — M67912 Unspecified disorder of synovium and tendon, left shoulder: Secondary | ICD-10-CM | POA: Diagnosis not present

## 2014-12-11 DIAGNOSIS — M65332 Trigger finger, left middle finger: Secondary | ICD-10-CM | POA: Diagnosis not present

## 2014-12-11 NOTE — Therapy (Signed)
[facility] [address] [place], [place], [address] Phone: [phone]   Fax:  [phone]  Occupational Therapy Treatment  Patient Details  Name: Steve Brown MRN: [id number] Date of Birth: 1940/02/17 Referring Provider: Dr. Nita Sells  Encounter Date: 12/11/2014      OT End of Session - 12/11/14 1502    Visit Number 4   Number of Visits 17   Date for OT Re-Evaluation 01/16/15   Authorization Type Medicare, G-code needed   Authorization - Visit Number 4   Authorization - Number of Visits 10   OT Start Time [id number]   OT Stop Time 1400   OT Time Calculation (min) 45 min   Activity Tolerance Patient tolerated treatment well   Behavior During Therapy [facility] for tasks assessed/performed      Past Medical History  Diagnosis Date  . Diabetes mellitus   . Coronary artery disease   . Hypertension   . History of TIAs   . Lumbago   . Benign localized hyperplasia of prostate without urinary obstruction and other lower urinary tract symptoms (LUTS)   . Other and unspecified hyperlipidemia   . Unspecified essential hypertension   . Type II or unspecified type diabetes mellitus without mention of complication, not stated as uncontrolled   . Personal history of unspecified circulatory disease   . Unspecified fall   . Hypersomnia with sleep apnea, unspecified   . Pain in joint, multiple sites   . Degeneration of cervical intervertebral disc   . Unspecified cardiovascular disease   . Unsteady gait 05/19/2013    X 24 hours - improving. Hx prior CVA/TIAs; multiple risk factors; on Xarelto  . Stroke [facility]) 2002, 2003    both sided weakness  . Trigger finger of both hands 11-17-13    Past Surgical History  Procedure Laterality Date  . Hernia repair    . Carpal tunnel release      bilateral  . Elbow surgery      right elbow - nerve release  . Cervical disc surgery    . Lungs      "fluid pumped off lungs"  .  Coronary stent placement  Feb. 2010  . Cardiac catheterization    . Coronary angioplasty    . Left heart catheterization with coronary angiogram N/A 04/03/2013    Procedure: LEFT HEART CATHETERIZATION WITH CORONARY ANGIOGRAM;  Surgeon: Jettie Booze, MD;  Location: [facility] CATH LAB;  Service: Cardiovascular;  Laterality: N/A;    There were no vitals filed for this visit.  Visit Diagnosis:  Generalized muscle weakness  Pain of right arm  Decreased functional activity tolerance  Decreased activity tolerance      Subjective Assessment - 12/11/14 1321    Pertinent History hx of multiple CVAs and TIAs   Patient Stated Goals improve LUE functional use/strength, return to [facility] activities   Currently in Pain? Yes   Pain Score 9    Pain Location Arm   Pain Orientation Left   Pain Descriptors / Indicators Aching;Sharp;Shooting   Pain Type Chronic pain   Pain Radiating Towards radiates shoulder to hand   Pain Onset More than a month ago   Aggravating Factors  overuse    Pain Relieving Factors heat   Effect of Pain on Daily Activities limits activity    Multiple Pain Sites Yes   Pain Score 9   Pain Location Back   Pain Orientation Mid;Lower   Pain Descriptors / Indicators Burning;Aching;Stabbing   Pain Type  Chronic pain   Pain Onset More than a month ago   Pain Frequency Constant   Aggravating Factors  walking, bending, standing, getting up from recliner   Pain Relieving Factors voltaren gel   Effect of Pain on Daily Activities functional tasks and daily exercise routine          Pt on ergometer/arm bike for 7 minutes, frontwards and backwards motion with one rest break needed ~5 minutes in.   Focused therapeutic activity on reaching in cabinets for functional reach, standing and using functional reach to put large pegs in peg board and take them back out.  Focused therapeutic exercise on weighted ball ( 2.2 lbs) shoulder exercises. Pt seated in chair for these exercises,  therapist encouraged focus on trunk/core control as well; having patient sit forward and not against back of chair.   Due to weakness in left shoulder and some chronic pain, pt needed rest breaks throughout session.                       OT Short Term Goals - 12/11/14 1501    OT SHORT TERM GOAL #1   Title Pt will be independent with initial HEP for LUE strength.--check STGs 12/16/14   Time 4   Period Weeks   Status On-going   OT SHORT TERM GOAL #2   Title Pt will be able to retrieve 2-3lb object from overhead shelf with good safety at least 3x.   Time 4   Period Weeks   Status On-going   OT SHORT TERM GOAL #3   Title Pt will report LUE pain less than or equal to 7/10 consistently during ADLs.   Time 4   Period Weeks   Status On-going   OT SHORT TERM GOAL #4   Title Pt will verbalize understanding of AE/strategies for ADLs prn (including energy conservation strategies).   Time 4   Period Weeks   Status On-going   OT SHORT TERM GOAL #5   Title Pt will be able to cut food using LUE as assist.   Time 4   Period Weeks   Status On-going           OT Long Term Goals - 12/11/14 1501    OT LONG TERM GOAL #1   Title Pt will be independent with updated HEP for LUE strengthening.--check LTGs 01/15/15   Time 8   Period Weeks   Status On-going   OT LONG TERM GOAL #2   Title Pt will improve LUE functional use as shown by improving score on Upper Extremity Functional Scale to at least 45/80.   Baseline 32/80   Time 8   Period Weeks   Status On-going   OT LONG TERM GOAL #3   Title Pt will demo ability to perform physical activity for at least 34min in prep for playing with 7 y.o. grandwon.   Time 8   Period Weeks   Status On-going   OT LONG TERM GOAL #4   Title Pt will return to previous community fitness activities with min difficulty.   Time 8   Period Weeks   Status On-going   OT LONG TERM GOAL #5   Title Pt will be able to carry 5lb bag of groceries in LUE  for at least 47feet safely.   Time 8   Period Weeks   Status On-going               Plan - 12/11/14 1457  Clinical Impression Statement Pt continues to progress towards OT goals for UE strength and functional use. Continue plan of care.    Pt will benefit from skilled therapeutic intervention in order to improve on the following deficits (Retired) Decreased cognition;Decreased mobility;Decreased strength;Impaired UE functional use;Pain;Decreased knowledge of use of DME;Decreased activity tolerance;Decreased endurance;Impaired sensation   Rehab Potential Good   OT Frequency 2x / week   OT Duration 8 weeks   OT Treatment/Interventions Moist Heat;Passive range of motion;Therapeutic activities;DME and/or AE instruction;Therapeutic exercises;Neuromuscular education;Therapeutic exercise;Functional Mobility Training;Patient/family education;Self-care/ADL training;Ultrasound;Manual Therapy;Splinting;Energy conservation;Electrical Stimulation;Fluidtherapy   Plan LUE functional use and strength   Consulted and Agree with Plan of Care Patient        Problem List Patient Active Problem List   Diagnosis Date Noted  . TIA (transient ischemic attack) 11/03/2014  . OSA on CPAP 05/18/2014  . White matter disease 08/14/2013  . OSA (obstructive sleep apnea) 08/14/2013  . Unsteady gait 05/19/2013  . Mixed hyperlipidemia 04/24/2013  . Syncope 03/20/2013  . Chest pain 10/08/2012  . Hypertension   . Stroke ([place])   . History of TIAs   . Lumbago   . Other and unspecified hyperlipidemia   . Personal history of unspecified circulatory disease   . Unspecified fall   . Pain in joint, multiple sites   . Degeneration of cervical intervertebral disc   . Unspecified cardiovascular disease   . History of pulmonary embolism: June 2014,  Takes Xarelto 07/01/2012    Class: History of  . Hemiparesis ([place]) 07/18/2011  . Diabetes mellitus type 2 with complications ([place]) 99991111  . HTN  (hypertension) 07/18/2011  . CAD in native artery 07/18/2011  . History of recurrent TIAs 07/18/2011    Joselle Deeds , MS, OTR/L, CLT Pager: [phone]  12/11/2014, 3:08 PM  [facility] [address] [place], [place], [address] Phone: [phone]   Fax:  [phone]  Name: Steve Brown MRN: [id number] Date of Birth: [birth year]

## 2014-12-15 ENCOUNTER — Ambulatory Visit: Payer: Medicare Other | Admitting: Occupational Therapy

## 2014-12-15 DIAGNOSIS — R6889 Other general symptoms and signs: Secondary | ICD-10-CM

## 2014-12-15 DIAGNOSIS — M79601 Pain in right arm: Secondary | ICD-10-CM | POA: Diagnosis not present

## 2014-12-15 DIAGNOSIS — M6281 Muscle weakness (generalized): Secondary | ICD-10-CM

## 2014-12-15 NOTE — Therapy (Signed)
[facility] [address] [place], [place], [address] Phone: [phone]   Fax:  [phone]  Occupational Therapy Treatment  Patient Details  Name: Steve Brown MRN: [id number] Date of Birth: Aug 01, 1940 Referring Provider: Dr. Nita Sells  Encounter Date: 12/15/2014      OT End of Session - 12/15/14 1325    Visit Number 5   Number of Visits 17   Date for OT Re-Evaluation 01/16/15   Authorization Type Medicare, G-code needed   Authorization - Visit Number 5   Authorization - Number of Visits 10   OT Start Time 1318   OT Stop Time 1400   OT Time Calculation (min) 42 min   Activity Tolerance Patient tolerated treatment well   Behavior During Therapy [facility] for tasks assessed/performed      Past Medical History  Diagnosis Date  . Diabetes mellitus   . Coronary artery disease   . Hypertension   . History of TIAs   . Lumbago   . Benign localized hyperplasia of prostate without urinary obstruction and other lower urinary tract symptoms (LUTS)   . Other and unspecified hyperlipidemia   . Unspecified essential hypertension   . Type II or unspecified type diabetes mellitus without mention of complication, not stated as uncontrolled   . Personal history of unspecified circulatory disease   . Unspecified fall   . Hypersomnia with sleep apnea, unspecified   . Pain in joint, multiple sites   . Degeneration of cervical intervertebral disc   . Unspecified cardiovascular disease   . Unsteady gait 05/19/2013    X 24 hours - improving. Hx prior CVA/TIAs; multiple risk factors; on Xarelto  . Stroke [facility]) 2002, 2003    both sided weakness  . Trigger finger of both hands 11-17-13    Past Surgical History  Procedure Laterality Date  . Hernia repair    . Carpal tunnel release      bilateral  . Elbow surgery      right elbow - nerve release  . Cervical disc surgery    . Lungs      "fluid pumped off lungs"  .  Coronary stent placement  Feb. 2010  . Cardiac catheterization    . Coronary angioplasty    . Left heart catheterization with coronary angiogram N/A 04/03/2013    Procedure: LEFT HEART CATHETERIZATION WITH CORONARY ANGIOGRAM;  Surgeon: Jettie Booze, MD;  Location: [facility] CATH LAB;  Service: Cardiovascular;  Laterality: N/A;    There were no vitals filed for this visit.  Visit Diagnosis:  Generalized muscle weakness  Decreased activity tolerance      Subjective Assessment - 12/15/14 1319    Subjective  Pt reports that he has returned to the [facility] and walked and did a silver sneakers class today.  Pt reports L shoulder bone spurs, but MD does not think he is a good canidate for surgery due to stroke risk.   Pertinent History hx of multiple CVAs and TIAs   Patient Stated Goals improve LUE functional use/strength, return to [facility] activities   Currently in Pain? Yes   Pain Location Arm   Pain Orientation Left   Pain Descriptors / Indicators Aching;Shooting;Sharp   Aggravating Factors  overuse   Pain Relieving Factors heat                      OT Treatments/Exercises (OP) - 12/15/14 0001    ADLs   ADL Comments Discussed progress--see goals section  Shoulder Exercises: Seated   Other Seated Exercises Arm bike x37min level 3 for conditioning without rest.    Functional Reaching Activities   Mid Level Functional reaching to place/remove clothespins with 1-8lb resistance with 2lb wt. on wrist for incr strength.  Removed without weight on wrist.   High Level Functional reaching with LUE in sitting to place small pegs in vertical pegboard with min difficulty and fatigue.  Pt demo min compensation but demo good awareness, but unable to fully correct.  Intermittent rest breaks.   LUE Fluidotherapy   Number Minutes Fluidotherapy 8 Minutes   LUE Fluidotherapy Location Hand;Wrist   Comments with no adverse reactions at beginning of session at pt request due to pain (BUEs at the  same time)                  OT Short Term Goals - 12/15/14 1339    OT SHORT TERM GOAL #1   Title Pt will be independent with initial HEP for LUE strength.--check STGs 12/16/14   Time 4   Period Weeks   Status On-going   OT SHORT TERM GOAL #2   Title Pt will be able to retrieve 2-3lb object from overhead shelf with good safety at least 3x.   Time 4   Period Weeks   Status On-going   OT SHORT TERM GOAL #3   Title Pt will report LUE pain less than or equal to 7/10 consistently during ADLs.   Time 4   Period Weeks   Status On-going   OT SHORT TERM GOAL #4   Title Pt will verbalize understanding of AE/strategies for ADLs prn (including energy conservation strategies).   Time 4   Period Weeks   Status Achieved  12/15/14   OT SHORT TERM GOAL #5   Title Pt will be able to cut food using LUE as assist.   Time 4   Period Weeks   Status Achieved  12/15/14           OT Long Term Goals - 12/11/14 1501    OT LONG TERM GOAL #1   Title Pt will be independent with updated HEP for LUE strengthening.--check LTGs 01/15/15   Time 8   Period Weeks   Status On-going   OT LONG TERM GOAL #2   Title Pt will improve LUE functional use as shown by improving score on Upper Extremity Functional Scale to at least 45/80.   Baseline 32/80   Time 8   Period Weeks   Status On-going   OT LONG TERM GOAL #3   Title Pt will demo ability to perform physical activity for at least 22min in prep for playing with 7 y.o. grandwon.   Time 8   Period Weeks   Status On-going   OT LONG TERM GOAL #4   Title Pt will return to previous community fitness activities with min difficulty.   Time 8   Period Weeks   Status On-going   OT LONG TERM GOAL #5   Title Pt will be able to carry 5lb bag of groceries in LUE for at least 18feet safely.   Time 8   Period Weeks   Status On-going               Plan - 12/15/14 1343    Clinical Impression Statement Pt is progressing towards goals with  improved UE strength and LUE functional use.   Plan LUE functional use and strength; review HEP (and check goal)  Consulted and Agree with Plan of Care Patient        Problem List Patient Active Problem List   Diagnosis Date Noted  . TIA (transient ischemic attack) 11/03/2014  . OSA on CPAP 05/18/2014  . White matter disease 08/14/2013  . OSA (obstructive sleep apnea) 08/14/2013  . Unsteady gait 05/19/2013  . Mixed hyperlipidemia 04/24/2013  . Syncope 03/20/2013  . Chest pain 10/08/2012  . Hypertension   . Stroke ([place])   . History of TIAs   . Lumbago   . Other and unspecified hyperlipidemia   . Personal history of unspecified circulatory disease   . Unspecified fall   . Pain in joint, multiple sites   . Degeneration of cervical intervertebral disc   . Unspecified cardiovascular disease   . History of pulmonary embolism: June 2014,  Takes Xarelto 07/01/2012    Class: History of  . Hemiparesis ([place]) 07/18/2011  . Diabetes mellitus type 2 with complications ([place]) 99991111  . HTN (hypertension) 07/18/2011  . CAD in native artery 07/18/2011  . History of recurrent TIAs 07/18/2011    [facility] 12/15/2014, 2:01 PM  [facility] [address] [place], [place], [address] Phone: [phone]   Fax:  [phone]  Name: Steve Brown MRN: [id number] Date of Birth: 02-Jul-1940  Vianne Bulls, OTR/L 12/15/2014 2:01 PM

## 2014-12-17 ENCOUNTER — Ambulatory Visit: Payer: Medicare Other | Admitting: *Deleted

## 2014-12-22 ENCOUNTER — Encounter: Payer: Medicare Other | Admitting: Occupational Therapy

## 2014-12-24 ENCOUNTER — Encounter: Payer: Medicare Other | Admitting: Occupational Therapy

## 2014-12-24 DIAGNOSIS — M5136 Other intervertebral disc degeneration, lumbar region: Secondary | ICD-10-CM | POA: Diagnosis not present

## 2014-12-24 DIAGNOSIS — M5417 Radiculopathy, lumbosacral region: Secondary | ICD-10-CM | POA: Diagnosis not present

## 2014-12-29 ENCOUNTER — Encounter: Payer: Self-pay | Admitting: Occupational Therapy

## 2014-12-29 ENCOUNTER — Ambulatory Visit: Payer: Medicare Other | Admitting: Occupational Therapy

## 2014-12-29 DIAGNOSIS — M6281 Muscle weakness (generalized): Secondary | ICD-10-CM

## 2014-12-29 DIAGNOSIS — R6889 Other general symptoms and signs: Secondary | ICD-10-CM

## 2014-12-29 DIAGNOSIS — M79601 Pain in right arm: Secondary | ICD-10-CM | POA: Diagnosis not present

## 2014-12-29 NOTE — Therapy (Signed)
[facility] [address] [place], [place], [address] Phone: [phone]   Fax:  [phone]  Occupational Therapy Treatment  Patient Details  Name: Steve Brown MRN: [id number] Date of Birth: 04-11-1940 Referring Provider: Dr. Nita Sells  Encounter Date: 12/29/2014      OT End of Session - 12/29/14 1345    Visit Number 6   Number of Visits 17   Date for OT Re-Evaluation 01/16/15   Authorization Type Medicare, G-code needed   Authorization - Visit Number 6   Authorization - Number of Visits 10   OT Start Time 1320   OT Stop Time 1400   OT Time Calculation (min) 40 min   Activity Tolerance Patient tolerated treatment well   Behavior During Therapy [facility] for tasks assessed/performed      Past Medical History  Diagnosis Date  . Diabetes mellitus   . Coronary artery disease   . Hypertension   . History of TIAs   . Lumbago   . Benign localized hyperplasia of prostate without urinary obstruction and other lower urinary tract symptoms (LUTS)   . Other and unspecified hyperlipidemia   . Unspecified essential hypertension   . Type II or unspecified type diabetes mellitus without mention of complication, not stated as uncontrolled   . Personal history of unspecified circulatory disease   . Unspecified fall   . Hypersomnia with sleep apnea, unspecified   . Pain in joint, multiple sites   . Degeneration of cervical intervertebral disc   . Unspecified cardiovascular disease   . Unsteady gait 05/19/2013    X 24 hours - improving. Hx prior CVA/TIAs; multiple risk factors; on Xarelto  . Stroke [facility]) 2002, 2003    both sided weakness  . Trigger finger of both hands 11-17-13    Past Surgical History  Procedure Laterality Date  . Hernia repair    . Carpal tunnel release      bilateral  . Elbow surgery      right elbow - nerve release  . Cervical disc surgery    . Lungs      "fluid pumped off lungs"  .  Coronary stent placement  Feb. 2010  . Cardiac catheterization    . Coronary angioplasty    . Left heart catheterization with coronary angiogram N/A 04/03/2013    Procedure: LEFT HEART CATHETERIZATION WITH CORONARY ANGIOGRAM;  Surgeon: Jettie Booze, MD;  Location: [facility] CATH LAB;  Service: Cardiovascular;  Laterality: N/A;    There were no vitals filed for this visit.  Visit Diagnosis:  Generalized muscle weakness  Decreased activity tolerance  Pain of right arm      Subjective Assessment - 12/29/14 1403    Subjective  Pt reports that his LUE is close to how it was before   Pertinent History hx of multiple CVAs and TIAs   Patient Stated Goals improve LUE functional use/strength, return to [facility] activities   Currently in Pain? Yes   Pain Score 7   4-8/10   Pain Location Arm   Pain Orientation Left   Pain Descriptors / Indicators Aching;Sharp;Sore   Pain Type Chronic pain   Aggravating Factors  overuse   Pain Relieving Factors heat                      OT Treatments/Exercises (OP) - 12/29/14 0001    ADLs   Home Maintenance Pt reports carrying groceries in LUE now.  Pt able to carry bag with  5lb weight 212feet without difficulty/rest.   Shoulder Exercises: Seated   Other Seated Exercises Arm bike x37min level 5 for conditioning with 1 short <56min rest followed by x18min with level 3 resistance   Functional Reaching Activities   Mid Level mid-to high range functional reaching to place small pegs in pegboard for incr activity tolerance xapprox 10 min without rest.   High Level Placing 2lb object on overhead shelf x5 with min difficulty, but mild incr pain.                OT Education - 12/29/14 1357    Education provided Yes   Education Details reviewed Steve theraband HEP   Person(s) Educated Patient   Methods Explanation   Comprehension Verbalized understanding;Returned demonstration          OT Short Term Goals - 12/29/14 1341    OT SHORT TERM  GOAL #1   Title Pt will be independent with initial HEP for LUE strength.--check STGs 12/16/14   Time 4   Period Weeks   Status Achieved  12/29/14   OT SHORT TERM GOAL #2   Title Pt will be able to retrieve 2-3lb object from overhead shelf with good safety at least 3x.   Time 4   Period Weeks   Status Achieved  12/29/14:  with 2lbs with good safety, but reports mild incr pain   OT SHORT TERM GOAL #3   Title Pt will report LUE pain less than or equal to 7/10 consistently during ADLs.   Time 4   Period Weeks   Status On-going  12/29/14:  approximating 7-8/10 or less   OT SHORT TERM GOAL #4   Title Pt will verbalize understanding of AE/strategies for ADLs prn (including energy conservation strategies).   Time 4   Period Weeks   Status Achieved  12/15/14   OT SHORT TERM GOAL #5   Title Pt will be able to cut food using LUE as assist.   Time 4   Period Weeks   Status Achieved  12/15/14           OT Long Term Goals - 12/29/14 1349    OT LONG TERM GOAL #1   Title Pt will be independent with updated HEP for LUE strengthening.--check LTGs 01/15/15   Time 8   Period Weeks   Status On-going   OT LONG TERM GOAL #2   Title Pt will improve LUE functional use as shown by improving score on Upper Extremity Functional Scale to at least 45/80.   Baseline 32/80   Time 8   Period Weeks   Status On-going   OT LONG TERM GOAL #3   Title Pt will demo ability to perform physical activity for at least 73min in prep for playing with 5 y.o. grandwon.   Time 8   Period Weeks   Status Achieved  12/29/14   OT LONG TERM GOAL #4   Title Pt will return to previous community fitness activities with min difficulty.   Time 8   Period Weeks   Status On-going   OT LONG TERM GOAL #5   Title Pt will be able to carry 5lb bag of groceries in LUE for at least 66feet safely.   Time 8   Period Weeks   Status Achieved  12/29/14               Plan - 12/29/14 1346    Clinical Impression  Statement Pt continues to progress towards goals for improved  UE strength/activity tolerance.   Plan pt reports that he would like to continue for 2 more visits after this week due to missed visits last week; therefore plan to d/c in next 3 visits.   Consulted and Agree with Plan of Care Patient        Problem List Patient Active Problem List   Diagnosis Date Noted  . TIA (transient ischemic attack) 11/03/2014  . OSA on CPAP 05/18/2014  . White matter disease 08/14/2013  . OSA (obstructive sleep apnea) 08/14/2013  . Unsteady gait 05/19/2013  . Mixed hyperlipidemia 04/24/2013  . Syncope 03/20/2013  . Chest pain 10/08/2012  . Hypertension   . Stroke ([place])   . History of TIAs   . Lumbago   . Other and unspecified hyperlipidemia   . Personal history of unspecified circulatory disease   . Unspecified fall   . Pain in joint, multiple sites   . Degeneration of cervical intervertebral disc   . Unspecified cardiovascular disease   . History of pulmonary embolism: June 2014,  Takes Xarelto 07/01/2012    Class: History of  . Hemiparesis ([place]) 07/18/2011  . Diabetes mellitus type 2 with complications ([place]) 99991111  . HTN (hypertension) 07/18/2011  . CAD in native artery 07/18/2011  . History of recurrent TIAs 07/18/2011    [facility] 12/29/2014, 5:18 PM  [facility] [address] [place], [place], [address] Phone: [phone]   Fax:  [phone]  Name: Steve Brown MRN: [id number] Date of Birth: 1940-02-01  Vianne Bulls, OTR/L 12/29/2014 5:18 PM

## 2014-12-31 ENCOUNTER — Ambulatory Visit: Payer: Medicare Other | Admitting: *Deleted

## 2014-12-31 DIAGNOSIS — M6281 Muscle weakness (generalized): Secondary | ICD-10-CM

## 2014-12-31 DIAGNOSIS — M79601 Pain in right arm: Secondary | ICD-10-CM | POA: Diagnosis not present

## 2014-12-31 DIAGNOSIS — R6889 Other general symptoms and signs: Secondary | ICD-10-CM

## 2014-12-31 NOTE — Therapy (Signed)
[facility] [address] [place], [place], [address] Phone: [phone]   Fax:  [phone]  Occupational Therapy Treatment  Patient Details  Name: Ayo Dejardin MRN: [id number] Date of Birth: Sep 27, 1940 Referring Provider: Dr. Nita Sells  Encounter Date: 12/31/2014      OT End of Session - 12/31/14 1404    Visit Number 7   Number of Visits 17   Date for OT Re-Evaluation 01/16/15   Authorization Type Medicare, G-code needed   Authorization - Visit Number 7   Authorization - Number of Visits 10   OT Start Time [id number]   OT Stop Time 1410   OT Time Calculation (min) 55 min   Activity Tolerance Patient tolerated treatment well   Behavior During Therapy [facility] for tasks assessed/performed      Past Medical History  Diagnosis Date  . Diabetes mellitus   . Coronary artery disease   . Hypertension   . History of TIAs   . Lumbago   . Benign localized hyperplasia of prostate without urinary obstruction and other lower urinary tract symptoms (LUTS)   . Other and unspecified hyperlipidemia   . Unspecified essential hypertension   . Type II or unspecified type diabetes mellitus without mention of complication, not stated as uncontrolled   . Personal history of unspecified circulatory disease   . Unspecified fall   . Hypersomnia with sleep apnea, unspecified   . Pain in joint, multiple sites   . Degeneration of cervical intervertebral disc   . Unspecified cardiovascular disease   . Unsteady gait 05/19/2013    X 24 hours - improving. Hx prior CVA/TIAs; multiple risk factors; on Xarelto  . Stroke [facility]) 2002, 2003    both sided weakness  . Trigger finger of both hands 11-17-13    Past Surgical History  Procedure Laterality Date  . Hernia repair    . Carpal tunnel release      bilateral  . Elbow surgery      right elbow - nerve release  . Cervical disc surgery    . Lungs      "fluid pumped off lungs"  .  Coronary stent placement  Feb. 2010  . Cardiac catheterization    . Coronary angioplasty    . Left heart catheterization with coronary angiogram N/A 04/03/2013    Procedure: LEFT HEART CATHETERIZATION WITH CORONARY ANGIOGRAM;  Surgeon: Jettie Booze, MD;  Location: [facility] CATH LAB;  Service: Cardiovascular;  Laterality: N/A;    There were no vitals filed for this visit.  Visit Diagnosis:  Generalized muscle weakness  Decreased activity tolerance  Pain of right arm  Decreased functional activity tolerance      Subjective Assessment - 12/31/14 1320    Subjective  I feel the same, all is good. I feel like my LUE is close to back to normal. I feel like I could come here everyday and it would never get to 100%.    Pertinent History hx of multiple CVAs and TIAs   Patient Stated Goals improve LUE functional use/strength, return to [facility] activities   Currently in Pain? Yes   Pain Score 6    Pain Location Arm   Pain Orientation Left   Pain Descriptors / Indicators Aching;Sore;Stabbing;Throbbing  "all of it"   Pain Type Chronic pain   Pain Radiating Towards radiates shoulder to hand   Pain Onset More than a month ago   Pain Frequency Constant   Aggravating Factors  overuse   Pain  Relieving Factors heat   Effect of Pain on Daily Activities limits activity   Multiple Pain Sites Yes   Pain Score 4  "which is really good"   Pain Location Back   Pain Orientation Mid;Lower   Pain Descriptors / Indicators Burning;Aching;Stabbing   Pain Type Chronic pain   Pain Onset More than a month ago   Pain Frequency Constant   Aggravating Factors  bending, arching, twisting, lifting - a lot of movement    Pain Relieving Factors volatren gel, relaxation, ice, heat   Effect of Pain on Daily Activities functional tasks and daily exercise routine      Pt started therapy with UE bike; random, level 4 for 5 minutes.    Skilled intervention focusing on: 1) High reach activity, placing clips on top  of filing cabinet. Pt required cueing for compensatory strategies. Encouraged pincer grasp during this activity. 2) Repetitive activity using stick to hit ball back to therapist, focusing on activity tolerance/endurance of LUE and generalized weakness.  3) Therapeutic exercise focusing on LUE strengthening using weighted balls; shoulder flexion, shoulder extension, shoulder press, elbow flexion/extension  Fludiotherapy at end of session for 10 minutes to help decrease pain.                          OT Short Term Goals - 12/31/14 1327    OT SHORT TERM GOAL #1   Title Pt will be independent with initial HEP for LUE strength.--check STGs 12/16/14   Time 4   Period Weeks   Status Achieved  12/29/14   OT SHORT TERM GOAL #2   Title Pt will be able to retrieve 2-3lb object from overhead shelf with good safety at least 3x.   Time 4   Period Weeks   Status Achieved  12/29/14:  with 2lbs with good safety, but reports mild incr pain   OT SHORT TERM GOAL #3   Title Pt will report LUE pain less than or equal to 7/10 consistently during ADLs.   Time 4   Period Weeks   Status On-going  12/29/14:  approximating 7-8/10 or less; 12/22 pt reported pain as 6/10 ("this is good")   OT SHORT TERM GOAL #4   Title Pt will verbalize understanding of AE/strategies for ADLs prn (including energy conservation strategies).   Time 4   Period Weeks   Status Achieved  12/15/14   OT SHORT TERM GOAL #5   Title Pt will be able to cut food using LUE as assist.   Time 4   Period Weeks   Status Achieved  12/15/14           OT Long Term Goals - 12/31/14 1327    OT LONG TERM GOAL #1   Title Pt will be independent with updated HEP for LUE strengthening.--check LTGs 01/15/15   Time 8   Period Weeks   Status On-going   OT LONG TERM GOAL #2   Title Pt will improve LUE functional use as shown by improving score on Upper Extremity Functional Scale to at least 45/80.   Baseline 32/80   Time 8    Period Weeks   Status On-going   OT LONG TERM GOAL #3   Title Pt will demo ability to perform physical activity for at least 37min in prep for playing with 72 y.o. grandwon.   Time 8   Period Weeks   Status Achieved  12/29/14   OT LONG TERM GOAL #4  Title Pt will return to previous community fitness activities with min difficulty.   Time 8   Period Weeks   Status On-going   OT LONG TERM GOAL #5   Title Pt will be able to carry 5lb bag of groceries in LUE for at least 58feet safely.   Time 8   Period Weeks   Status Achieved  12/29/14               Plan - 12/31/14 1401    Clinical Impression Statement Pt continues to progress towards OT goals. Pt motivated to regain strength and decrease pain. Pt states he really enjoys Fluidtherapy to decrease pain in Lt hand/wrist/forearm.    Pt will benefit from skilled therapeutic intervention in order to improve on the following deficits (Retired) Decreased cognition;Decreased mobility;Decreased strength;Impaired UE functional use;Pain;Decreased knowledge of use of DME;Decreased activity tolerance;Decreased endurance;Impaired sensation   Rehab Potential Good   OT Frequency 2x / week   OT Duration 8 weeks   OT Treatment/Interventions Moist Heat;Passive range of motion;Therapeutic activities;DME and/or AE instruction;Therapeutic exercises;Neuromuscular education;Therapeutic exercise;Functional Mobility Training;Patient/family education;Self-care/ADL training;Ultrasound;Manual Therapy;Splinting;Energy conservation;Electrical Stimulation;Fluidtherapy   Plan Pt reports that he would like to continue for 1 more visit due to missed visit last week, therefore plan to d/c in next 2 visits   Consulted and Agree with Plan of Care Patient        Problem List Patient Active Problem List   Diagnosis Date Noted  . TIA (transient ischemic attack) 11/03/2014  . OSA on CPAP 05/18/2014  . White matter disease 08/14/2013  . OSA (obstructive sleep  apnea) 08/14/2013  . Unsteady gait 05/19/2013  . Mixed hyperlipidemia 04/24/2013  . Syncope 03/20/2013  . Chest pain 10/08/2012  . Hypertension   . Stroke ([place])   . History of TIAs   . Lumbago   . Other and unspecified hyperlipidemia   . Personal history of unspecified circulatory disease   . Unspecified fall   . Pain in joint, multiple sites   . Degeneration of cervical intervertebral disc   . Unspecified cardiovascular disease   . History of pulmonary embolism: June 2014,  Takes Xarelto 07/01/2012    Class: History of  . Hemiparesis ([place]) 07/18/2011  . Diabetes mellitus type 2 with complications ([place]) 99991111  . HTN (hypertension) 07/18/2011  . CAD in native artery 07/18/2011  . History of recurrent TIAs 07/18/2011    Chrys Racer , MS, OTR/L, CLT Pager: [phone]  12/31/2014, 2:55 PM  [facility] [address] [place], [place], [address] Phone: [phone]   Fax:  [phone]  Name: Chanze Vanegas MRN: [id number] Date of Birth: Jun 14, 1940

## 2015-01-12 ENCOUNTER — Ambulatory Visit: Payer: Medicare Other | Attending: Family Medicine | Admitting: Occupational Therapy

## 2015-01-12 DIAGNOSIS — R6889 Other general symptoms and signs: Secondary | ICD-10-CM | POA: Diagnosis not present

## 2015-01-12 DIAGNOSIS — M79601 Pain in right arm: Secondary | ICD-10-CM | POA: Diagnosis not present

## 2015-01-12 DIAGNOSIS — M6281 Muscle weakness (generalized): Secondary | ICD-10-CM | POA: Insufficient documentation

## 2015-01-12 NOTE — Therapy (Signed)
[facility] [address] [place], [place], [address] Phone: [phone]   Fax:  [phone]  Occupational Therapy Treatment  Patient Details  Name: Steve Brown MRN: [id number] Date of Birth: 1940/07/08 Referring Provider: Dr. Nita Sells  Encounter Date: 01/12/2015      OT End of Session - 01/12/15 1413    Visit Number 8   Number of Visits 17   Date for OT Re-Evaluation 01/16/15   Authorization Type Medicare, G-code needed   Authorization - Visit Number 8   Authorization - Number of Visits 10   OT Start Time [id number]   OT Stop Time 1447   OT Time Calculation (min) 42 min   Activity Tolerance Patient tolerated treatment well   Behavior During Therapy [facility] for tasks assessed/performed      Past Medical History  Diagnosis Date  . Diabetes mellitus   . Coronary artery disease   . Hypertension   . History of TIAs   . Lumbago   . Benign localized hyperplasia of prostate without urinary obstruction and other lower urinary tract symptoms (LUTS)   . Other and unspecified hyperlipidemia   . Unspecified essential hypertension   . Type II or unspecified type diabetes mellitus without mention of complication, not stated as uncontrolled   . Personal history of unspecified circulatory disease   . Unspecified fall   . Hypersomnia with sleep apnea, unspecified   . Pain in joint, multiple sites   . Degeneration of cervical intervertebral disc   . Unspecified cardiovascular disease   . Unsteady gait 05/19/2013    X 24 hours - improving. Hx prior CVA/TIAs; multiple risk factors; on Xarelto  . Stroke [facility]) 2002, 2003    both sided weakness  . Trigger finger of both hands 11-17-13    Past Surgical History  Procedure Laterality Date  . Hernia repair    . Carpal tunnel release      bilateral  . Elbow surgery      right elbow - nerve release  . Cervical disc surgery    . Lungs      "fluid pumped off lungs"  .  Coronary stent placement  Feb. 2010  . Cardiac catheterization    . Coronary angioplasty    . Left heart catheterization with coronary angiogram N/A 04/03/2013    Procedure: LEFT HEART CATHETERIZATION WITH CORONARY ANGIOGRAM;  Surgeon: Jettie Booze, MD;  Location: [facility] CATH LAB;  Service: Cardiovascular;  Laterality: N/A;    There were no vitals filed for this visit.  Visit Diagnosis:  Generalized muscle weakness  Decreased activity tolerance  Pain of right arm      Subjective Assessment - 01/12/15 1408    Subjective  "Everything is going good"   Pertinent History hx of multiple CVAs and TIAs   Patient Stated Goals improve LUE functional use/strength, return to [facility] activities   Currently in Pain? Yes   Pain Score 7    Pain Location Arm   Pain Orientation Left   Pain Descriptors / Indicators Aching;Sore;Throbbing   Pain Type Chronic pain   Pain Frequency Constant   Aggravating Factors  overuse   Pain Relieving Factors heat                      OT Treatments/Exercises (OP) - 01/12/15 0001     Exercises   Other Exercises 1 With 4lb weighted ball, pt performed shoulder flex, chest press, horizontal adduction/abduction, and diagonals with BUEs x10  each.   Other Weight-Bearing Exercises 1 Wt. bearing through both UEs on elbow in modified plank with min cues x5 (10sec hold),  Then with side to side wt. shift in this position with alternating UE lifts.  modified bridging off edge of mat (unable to lift body) to encourage incr scapular stabilization x8 with 10sec hold.     Reciprocal Movements Arm bike x6 min level 4 for conditioning withou rest.   Functional Reaching Activities   Mid Level Functional reaching to place/remove clothespins with 1-8lb resistance for incr strength/activity tolerance.   RUE Fluidotherapy   Number Minutes Fluidotherapy 6 Minutes   RUE Fluidotherapy Location Hand;Wrist   Comments with no adverse reactions for pain (at end of session)   BUEs at the same time   LUE Fluidotherapy   Number Minutes Fluidotherapy 6 Minutes   LUE Fluidotherapy Location Hand;Wrist   Comments with no adverse reactions for pain (at end of session)  BUEs at the same time.     Recommended Paraffin for home use and instructed pt in where to purchase, use, and purpose (due to pain).  Pt verbalized understanding.  Began checking goals and discussing progress.             OT Short Term Goals - 12/31/14 1327    OT SHORT TERM GOAL #1   Title Pt will be independent with initial HEP for LUE strength.--check STGs 12/16/14   Time 4   Period Weeks   Status Achieved  12/29/14   OT SHORT TERM GOAL #2   Title Pt will be able to retrieve 2-3lb object from overhead shelf with good safety at least 3x.   Time 4   Period Weeks   Status Achieved  12/29/14:  with 2lbs with good safety, but reports mild incr pain   OT SHORT TERM GOAL #3   Title Pt will report LUE pain less than or equal to 7/10 consistently during ADLs.   Time 4   Period Weeks   Status On-going  12/29/14:  approximating 7-8/10 or less; 12/22 pt reported pain as 6/10 ("this is good")   OT SHORT TERM GOAL #4   Title Pt will verbalize understanding of AE/strategies for ADLs prn (including energy conservation strategies).   Time 4   Period Weeks   Status Achieved  12/15/14   OT SHORT TERM GOAL #5   Title Pt will be able to cut food using LUE as assist.   Time 4   Period Weeks   Status Achieved  12/15/14           OT Long Term Goals - 01/12/15 1412    OT LONG TERM GOAL #1   Title Pt will be independent with updated HEP for LUE strengthening.--check LTGs 01/15/15   Time 8   Period Weeks   Status On-going   OT LONG TERM GOAL #2   Title Pt will improve LUE functional use as shown by improving score on Upper Extremity Functional Scale to at least 45/80.   Baseline 32/80   Time 8   Period Weeks   Status On-going   OT LONG TERM GOAL #3   Title Pt will demo ability to perform  physical activity for at least 29min in prep for playing with 5 y.o. grandwon.   Time 8   Period Weeks   Status Achieved  12/29/14   OT LONG TERM GOAL #4   Title Pt will return to previous community fitness activities with min difficulty.   Time  8   Period Weeks   Status Achieved  01/12/15   OT LONG TERM GOAL #5   Title Pt will be able to carry 5lb bag of groceries in LUE for at least 20feet safely.   Time 8   Period Weeks   Status Achieved  12/29/14               Plan - 01/12/15 1412    Clinical Impression Statement Pt continues to progress towards goals with plan to d/c next session.   Plan check remaining goals and d/c next session, try Paraffin, ?update theraband HEP   Consulted and Agree with Plan of Care Patient        Problem List Patient Active Problem List   Diagnosis Date Noted  . TIA (transient ischemic attack) 11/03/2014  . OSA on CPAP 05/18/2014  . White matter disease 08/14/2013  . OSA (obstructive sleep apnea) 08/14/2013  . Unsteady gait 05/19/2013  . Mixed hyperlipidemia 04/24/2013  . Syncope 03/20/2013  . Chest pain 10/08/2012  . Hypertension   . Stroke ([place])   . History of TIAs   . Lumbago   . Other and unspecified hyperlipidemia   . Personal history of unspecified circulatory disease   . Unspecified fall   . Pain in joint, multiple sites   . Degeneration of cervical intervertebral disc   . Unspecified cardiovascular disease   . History of pulmonary embolism: June 2014,  Takes Xarelto 07/01/2012    Class: History of  . Hemiparesis ([place]) 07/18/2011  . Diabetes mellitus type 2 with complications ([place]) 99991111  . HTN (hypertension) 07/18/2011  . CAD in native artery 07/18/2011  . History of recurrent TIAs 07/18/2011    [facility] 01/12/2015, 3:39 PM  [place] [address] [place], [place], [address] Phone: [phone]   Fax:  [phone]  Name: Dwon Traub MRN: [id number] Date of Birth: 1940/09/23  Vianne Bulls, OTR/L 01/12/2015 3:39 PM

## 2015-01-14 ENCOUNTER — Ambulatory Visit: Payer: Medicare Other | Admitting: Occupational Therapy

## 2015-01-14 DIAGNOSIS — R6889 Other general symptoms and signs: Secondary | ICD-10-CM | POA: Diagnosis not present

## 2015-01-14 DIAGNOSIS — M79601 Pain in right arm: Secondary | ICD-10-CM | POA: Diagnosis not present

## 2015-01-14 DIAGNOSIS — M6281 Muscle weakness (generalized): Secondary | ICD-10-CM | POA: Diagnosis not present

## 2015-01-14 NOTE — Therapy (Signed)
[facility] [address] [place], [place], [address] Phone: [phone]   Fax:  [phone]  Occupational Therapy Treatment  Patient Details  Name: Steve Brown MRN: [id number] Date of Birth: 13-Apr-1940 Referring Provider: Dr. Nita Sells  Encounter Date: 01/14/2015      OT End of Session - 01/14/15 1627    Visit Number 9   Number of Visits 17   Date for OT Re-Evaluation 01/16/15   Authorization Type Medicare, G-code needed   Authorization - Visit Number 9   Authorization - Number of Visits 10   OT Start Time 1700   OT Stop Time 1615   OT Time Calculation (min) 42 min   Activity Tolerance Patient tolerated treatment well   Behavior During Therapy [facility] for tasks assessed/performed      Past Medical History  Diagnosis Date  . Diabetes mellitus   . Coronary artery disease   . Hypertension   . History of TIAs   . Lumbago   . Benign localized hyperplasia of prostate without urinary obstruction and other lower urinary tract symptoms (LUTS)   . Other and unspecified hyperlipidemia   . Unspecified essential hypertension   . Type II or unspecified type diabetes mellitus without mention of complication, not stated as uncontrolled   . Personal history of unspecified circulatory disease   . Unspecified fall   . Hypersomnia with sleep apnea, unspecified   . Pain in joint, multiple sites   . Degeneration of cervical intervertebral disc   . Unspecified cardiovascular disease   . Unsteady gait 05/19/2013    X 24 hours - improving. Hx prior CVA/TIAs; multiple risk factors; on Xarelto  . Stroke [facility]) 2002, 2003    both sided weakness  . Trigger finger of both hands 11-17-13    Past Surgical History  Procedure Laterality Date  . Hernia repair    . Carpal tunnel release      bilateral  . Elbow surgery      right elbow - nerve release  . Cervical disc surgery    . Lungs      "fluid pumped off lungs"  .  Coronary stent placement  Feb. 2010  . Cardiac catheterization    . Coronary angioplasty    . Left heart catheterization with coronary angiogram N/A 04/03/2013    Procedure: LEFT HEART CATHETERIZATION WITH CORONARY ANGIOGRAM;  Surgeon: Jettie Booze, MD;  Location: [facility] LAB;  Service: Cardiovascular;  Laterality: N/A;    There were no vitals filed for this visit.  Visit Diagnosis:  Generalized muscle weakness  Decreased activity tolerance  Pain of right arm  Decreased functional activity tolerance      Subjective Assessment - 01/14/15 1634    Pertinent History hx of multiple CVAs and TIAs   Patient Stated Goals improve LUE functional use/strength, return to [facility] activities   Currently in Pain? Yes   Pain Score 6   2/10 after parrafin   Pain Location Arm   Pain Orientation Left   Pain Descriptors / Indicators Aching   Pain Type Chronic pain   Pain Onset More than a month ago   Pain Frequency Constant   Aggravating Factors  overuse   Pain Relieving Factors heat   Effect of Pain on Daily Activities limits activity                Treatment: Parrafin to bilateral hands/ wrists x 10 mins, no adverse reactions. Pt plans to try at home, pain  in LUE decreased to 2/10 afterwards. Therapist checked progress towards goals. Pt scored 52/80 on Upper extremity functional scale. Upgraded HEP for theraband to green, 10 reps each exercise, pt returned demonstration.                 OT Short Term Goals - 01/14/15 1548    OT SHORT TERM GOAL #1   Title Pt will be independent with initial HEP for LUE strength.--check STGs 12/16/14   Time 4   Period Weeks   Status Achieved  12/29/14   OT SHORT TERM GOAL #2   Title Pt will be able to retrieve 2-3lb object from overhead shelf with good safety at least 3x.   Time 4   Period Weeks   Status Achieved  12/29/14:  with 2lbs with good safety, but reports mild incr pain   OT SHORT TERM GOAL #3   Title Pt will report  LUE pain less than or equal to 7/10 consistently during ADLs.   Baseline 6/10 pain at most   Time 4   Period Weeks   Status Achieved  12/29/14:  approximating 7-8/10 or less; 12/22 pt reported pain as 6/10 ("this is good")   OT SHORT TERM GOAL #4   Title Pt will verbalize understanding of AE/strategies for ADLs prn (including energy conservation strategies).   Time 4   Period Weeks   Status Achieved  12/15/14   OT SHORT TERM GOAL #5   Title Pt will be able to cut food using LUE as assist.   Time 4   Period Weeks   Status Achieved  12/15/14           OT Long Term Goals - 01/14/15 1550    OT LONG TERM GOAL #1   Title Pt will be independent with updated HEP for LUE strengthening.--check LTGs 01/15/15   Time 8   Period Weeks   Status Achieved 01/14/15   OT LONG TERM GOAL #2   Title Pt will improve LUE functional use as shown by improving score on Upper Extremity Functional Scale to at least 45/80.   Baseline 32/80   Time 8   Period Weeks   Status Achieved  52/80   OT LONG TERM GOAL #3   Title Pt will demo ability to perform physical activity for at least 30mn in prep for playing with 7 y.o. grandwon.   Time 8   Period Weeks   Status Achieved  12/29/14   OT LONG TERM GOAL #4   Title Pt will return to previous community fitness activities with min difficulty.   Time 8   Period Weeks   Status Achieved  01/12/15   OT LONG TERM GOAL #5   Title Pt will be able to carry 5lb bag of groceries in LUE for at least 267ft safely.   Time 8   Period Weeks   Status Achieved  12/29/14               Plan - 01/14/15 1606    Clinical Impression Statement Pt made excellent progress he agress with plans for d/c.   Pt will benefit from skilled therapeutic intervention in order to improve on the following deficits (Retired) Decreased cognition;Decreased mobility;Decreased strength;Impaired UE functional use;Pain;Decreased knowledge of use of DME;Decreased activity  tolerance;Decreased endurance;Impaired sensation   Rehab Potential Good   OT Frequency 2x / week   OT Duration 8 weeks   OT Treatment/Interventions Moist Heat;Passive range of motion;Therapeutic activities;DME and/or AE instruction;Therapeutic exercises;Neuromuscular education;Therapeutic exercise;Functional  Mobility Training;Patient/family education;Self-care/ADL training;Ultrasound;Manual Therapy;Splinting;Energy conservation;Electrical Stimulation;Fluidtherapy   Plan d/c   Consulted and Agree with Plan of Care Patient          G-Codes - Feb 10, 2015 1628    Functional Assessment Tool Used Upper extremity functional scale 52/80   Functional Limitation Carrying, moving and handling objects   Carrying, Moving and Handling Objects Current Status (I1947) --   Carrying, Moving and Handling Objects Goal Status (X2527) At least 20 percent but less than 40 percent impaired, limited or restricted   Carrying, Moving and Handling Objects Discharge Status [phone]) At least 20 percent but less than 40 percent impaired, limited or restricted      Problem List Patient Active Problem List   Diagnosis Date Noted  . TIA (transient ischemic attack) 11/03/2014  . OSA on CPAP 05/18/2014  . White matter disease 08/14/2013  . OSA (obstructive sleep apnea) 08/14/2013  . Unsteady gait 05/19/2013  . Mixed hyperlipidemia 04/24/2013  . Syncope 03/20/2013  . Chest pain 10/08/2012  . Hypertension   . Stroke ([place])   . History of TIAs   . Lumbago   . Other and unspecified hyperlipidemia   . Personal history of unspecified circulatory disease   . Unspecified fall   . Pain in joint, multiple sites   . Degeneration of cervical intervertebral disc   . Unspecified cardiovascular disease   . History of pulmonary embolism: June 2014,  Takes Xarelto 07/01/2012    Class: History of  . Hemiparesis ([place]) 07/18/2011  . Diabetes mellitus type 2 with complications ([place]) 09/12/147  . HTN (hypertension) 07/18/2011  .  CAD in native artery 07/18/2011  . History of recurrent TIAs 07/18/2011  Theone Murdoch, OTR/L [phone] Phone: [phone] 4:40 PM 02/10/15  Syrus Nakama Feb 10, 2015, 4:38 PM OCCUPATIONAL THERAPY DISCHARGE SUMMARY  Current functional level related to goals / functional outcomes: Pt made excellent progress and achieved all goals.   Remaining deficits: Decreased strength, mild pain in LUE   Education / Equipment: Pt was educated regarding HEP. He returned demonstration.  Plan: Patient agrees to discharge.  Patient goals were partially met. Patient is being discharged due to meeting the stated rehab goals.  ?????     [facility] [address] [place], [place], [address] Phone: [phone]   Fax:  [phone]  Name: Steve Brown MRN: [id number] Date of Birth: 07-11-1940

## 2015-01-22 DIAGNOSIS — M65332 Trigger finger, left middle finger: Secondary | ICD-10-CM | POA: Diagnosis not present

## 2015-01-22 DIAGNOSIS — M67912 Unspecified disorder of synovium and tendon, left shoulder: Secondary | ICD-10-CM | POA: Diagnosis not present

## 2015-01-27 DIAGNOSIS — R350 Frequency of micturition: Secondary | ICD-10-CM | POA: Diagnosis not present

## 2015-01-27 DIAGNOSIS — Z0000 Encounter for general adult medical examination without abnormal findings: Secondary | ICD-10-CM | POA: Diagnosis not present

## 2015-01-27 DIAGNOSIS — N390 Urinary tract infection, site not specified: Secondary | ICD-10-CM | POA: Diagnosis not present

## 2015-02-02 DIAGNOSIS — E1042 Type 1 diabetes mellitus with diabetic polyneuropathy: Secondary | ICD-10-CM | POA: Diagnosis not present

## 2015-02-17 DIAGNOSIS — Z0000 Encounter for general adult medical examination without abnormal findings: Secondary | ICD-10-CM | POA: Diagnosis not present

## 2015-02-17 DIAGNOSIS — R300 Dysuria: Secondary | ICD-10-CM | POA: Diagnosis not present

## 2015-02-17 DIAGNOSIS — R350 Frequency of micturition: Secondary | ICD-10-CM | POA: Diagnosis not present

## 2015-02-17 DIAGNOSIS — R81 Glycosuria: Secondary | ICD-10-CM | POA: Diagnosis not present

## 2015-03-12 ENCOUNTER — Ambulatory Visit: Payer: Medicare Other | Attending: Family Medicine | Admitting: Rehabilitation

## 2015-03-12 ENCOUNTER — Encounter: Payer: Self-pay | Admitting: Rehabilitation

## 2015-03-12 DIAGNOSIS — R2681 Unsteadiness on feet: Secondary | ICD-10-CM | POA: Insufficient documentation

## 2015-03-12 NOTE — Therapy (Signed)
[facility] [address] [place], [place], [address] Phone: [phone]   Fax:  [phone]  Physical Therapy Evaluation (One time Eval only)  Patient Details  Name: Steve Brown MRN: [id number] Date of Birth: 11-Feb-1940 Referring Provider: Lona Kettle, MD  Encounter Date: 03/12/2015      PT End of Session - 03/12/15 1312    Visit Number 1   Number of Visits 1   Authorization Type Medicare   PT Start Time [id number]   PT Stop Time 1102   PT Time Calculation (min) 47 min   Activity Tolerance Patient tolerated treatment well   Behavior During Therapy [facility] for tasks assessed/performed      Past Medical History  Diagnosis Date  . Diabetes mellitus   . Coronary artery disease   . Hypertension   . History of TIAs   . Lumbago   . Benign localized hyperplasia of prostate without urinary obstruction and other lower urinary tract symptoms (LUTS)   . Other and unspecified hyperlipidemia   . Unspecified essential hypertension   . Type II or unspecified type diabetes mellitus without mention of complication, not stated as uncontrolled   . Personal history of unspecified circulatory disease   . Unspecified fall   . Hypersomnia with sleep apnea, unspecified   . Pain in joint, multiple sites   . Degeneration of cervical intervertebral disc   . Unspecified cardiovascular disease   . Unsteady gait 05/19/2013    X 24 hours - improving. Hx prior CVA/TIAs; multiple risk factors; on Xarelto  . Stroke [facility]) 2002, 2003    both sided weakness  . Trigger finger of both hands 11-17-13    Past Surgical History  Procedure Laterality Date  . Hernia repair    . Carpal tunnel release      bilateral  . Elbow surgery      right elbow - nerve release  . Cervical disc surgery    . Lungs      "fluid pumped off lungs"  . Coronary stent placement  Feb. 2010  . Cardiac catheterization    . Coronary angioplasty    . Left heart  catheterization with coronary angiogram N/A 04/03/2013    Procedure: LEFT HEART CATHETERIZATION WITH CORONARY ANGIOGRAM;  Surgeon: Jettie Booze, MD;  Location: [facility] CATH LAB;  Service: Cardiovascular;  Laterality: N/A;    There were no vitals filed for this visit.  Visit Diagnosis:  Gait instability - Plan: PT plan of care cert/re-cert      Subjective Assessment - 03/12/15 1022    Subjective "I called Dr. Harrington Challenger and I explained to his Nurse that when I walk around the track or out in the community, I stumble, or drift to the L or R.  I'm just not real steady.  It has started getting worse here lately because I try to walk a mile at least 4-5 days a week."   Limitations Walking   Patient Stated Goals He wants to get some of pain out and improve balance.    Currently in Pain? Yes   Pain Score 6    Pain Location Back   Pain Orientation Lower;Mid   Pain Descriptors / Indicators Aching;Stabbing;Sharp   Pain Type Chronic pain   Pain Radiating Towards radiates to L leg   Pain Onset More than a month ago   Pain Frequency Constant   Aggravating Factors  walking a lot   Pain Relieving Factors walking initially (can't take pain medication due  to side effects)            [facility] PT Assessment - 03/12/15 0001    Assessment   Medical Diagnosis unsteady gait   Referring Provider Lona Kettle, MD   Onset Date/Surgical Date --  per pt report, noted decreased balance 6 months ago   Precautions   Precautions Fall   Restrictions   Weight Bearing Restrictions No   Balance Screen   Has the patient fallen in the past 6 months No  reports "stumbling"   Has the patient had a decrease in activity level because of a fear of falling?  Yes   Is the patient reluctant to leave their home because of a fear of falling?  No   Home Environment   Living Environment Private residence   Living Arrangements Alone   Available Help at Discharge Family;Available PRN/intermittently   Type of Home Apartment    Home Access Stairs to enter   Entrance Stairs-Number of Steps 18   Entrance Stairs-Rails Left   Home Layout One level   Prior Function   Level of Independence Independent with basic ADLs;Independent   Vocation Retired   Biomedical scientist retired [employer]   Leisure Going to [employer] 4-5 days/wk to walk a mile and do [employer] class and some machines   Cognition   Overall Cognitive Status Within Functional Limits for tasks assessed   Sensation   Light Touch Impaired Detail   Light Touch Impaired Details Impaired RLE;Impaired LLE  hx of neuropathy, but symptoms intermittent   Hot/Cold Appears Intact   Coordination   Gross Motor Movements are Fluid and Coordinated Yes   Fine Motor Movements are Fluid and Coordinated Yes   ROM / Strength   AROM / PROM / Strength Strength   Strength   Overall Strength Within functional limits for tasks performed   Transfers   Transfers Sit to Stand;Stand to Sit   Sit to Stand 6: Modified independent (Device/Increase time)   Stand to Sit 6: Modified independent (Device/Increase time)   Ambulation/Gait   Ambulation/Gait Yes   Ambulation/Gait Assistance 6: Modified independent (Device/Increase time)   Gait velocity 2.68 ft/sec   Functional Gait  Assessment   Gait assessed  Yes   Gait Level Surface Walks 20 ft in less than 7 sec but greater than 5.5 sec, uses assistive device, slower speed, mild gait deviations, or deviates 6-10 in outside of the 12 in walkway width.   Change in Gait Speed Able to smoothly change walking speed without loss of balance or gait deviation. Deviate no more than 6 in outside of the 12 in walkway width.   Gait with Horizontal Head Turns Performs head turns smoothly with no change in gait. Deviates no more than 6 in outside 12 in walkway width   Gait with Vertical Head Turns Performs head turns with no change in gait. Deviates no more than 6 in outside 12 in walkway width.   Gait and Pivot Turn Pivot turns safely within 3 sec  and stops quickly with no loss of balance.   Step Over Obstacle Is able to step over one shoe box (4.5 in total height) without changing gait speed. No evidence of imbalance.   Gait with Narrow Base of Support Is able to ambulate for 10 steps heel to toe with no staggering.   Gait with Eyes Closed Walks 20 ft, uses assistive device, slower speed, mild gait deviations, deviates 6-10 in outside 12 in walkway width. Ambulates 20 ft in less  than 9 sec but greater than 7 sec.   Ambulating Backwards Walks 20 ft, uses assistive device, slower speed, mild gait deviations, deviates 6-10 in outside 12 in walkway width.   Steps Alternating feet, no rail.   Total Score 26                           PT Education - Mar 30, 2015 1311    Education provided Yes   Education Details Education on continued community fitness and follow up with MD regarding MRI of spine if symptoms persist   Person(s) Educated Patient   Methods Explanation   Comprehension Verbalized understanding                    Plan - 2015-03-30 1312    Clinical Impression Statement Pt presents with recent reports of increasing instability.  Note that he presented to OP neuro PT in November for recent TIA in October of 2016.  Pt demonstrates 4 to 5/5 strength in all motions, balance is 26/30 on FGA (same as in November), gait speed had improved to 2.68 ft/sec this visit vs last visit in November.  Pt is active in [employer] 4-5 times per week and walks often.  Do not feel that pt warrants skilled PT at this time.  Recommend he follow up with MD for further imaging if radiculopathy increases in LLE.  Pt verbalized understanding.    PT Frequency One time visit   PT Next Visit Plan Evaluation only   Consulted and Agree with Plan of Care Patient          G-Codes - 03-30-15 1457    Functional Assessment Tool Used FGA 26/30   Functional Limitation Mobility: Walking and moving around   Mobility: Walking and Moving Around  Current Status [phone]) At least 1 percent but less than 20 percent impaired, limited or restricted   Mobility: Walking and Moving Around Goal Status [phone]) At least 1 percent but less than 20 percent impaired, limited or restricted   Mobility: Walking and Moving Around Discharge Status [phone]) At least 1 percent but less than 20 percent impaired, limited or restricted       Problem List Patient Active Problem List   Diagnosis Date Noted  . TIA (transient ischemic attack) 11/03/2014  . OSA on CPAP 05/18/2014  . White matter disease 08/14/2013  . OSA (obstructive sleep apnea) 08/14/2013  . Unsteady gait 05/19/2013  . Mixed hyperlipidemia 04/24/2013  . Syncope 03/20/2013  . Chest pain 10/08/2012  . Hypertension   . Stroke ([place])   . History of TIAs   . Lumbago   . Other and unspecified hyperlipidemia   . Personal history of unspecified circulatory disease   . Unspecified fall   . Pain in joint, multiple sites   . Degeneration of cervical intervertebral disc   . Unspecified cardiovascular disease   . History of pulmonary embolism: June 2014,  Takes Xarelto 07/01/2012    Class: History of  . Hemiparesis ([place]) 07/18/2011  . Diabetes mellitus type 2 with complications ([place]) [id number]  . HTN (hypertension) 07/18/2011  . CAD in native artery 07/18/2011  . History of recurrent TIAs 07/18/2011    Cameron Sprang, PT, MPT [facility] [address] [place], [place], [address] Phone: [phone]   Fax:  [phone] 03/30/15, 2:58 PM  Name: Steve Brown MRN: [id number] Date of Birth: 1940/07/03

## 2015-03-25 DIAGNOSIS — E7800 Pure hypercholesterolemia, unspecified: Secondary | ICD-10-CM | POA: Diagnosis not present

## 2015-03-25 DIAGNOSIS — G609 Hereditary and idiopathic neuropathy, unspecified: Secondary | ICD-10-CM | POA: Diagnosis not present

## 2015-03-25 DIAGNOSIS — Z79899 Other long term (current) drug therapy: Secondary | ICD-10-CM | POA: Diagnosis not present

## 2015-03-25 DIAGNOSIS — E1165 Type 2 diabetes mellitus with hyperglycemia: Secondary | ICD-10-CM | POA: Diagnosis not present

## 2015-04-01 DIAGNOSIS — G609 Hereditary and idiopathic neuropathy, unspecified: Secondary | ICD-10-CM | POA: Diagnosis not present

## 2015-04-01 DIAGNOSIS — E7800 Pure hypercholesterolemia, unspecified: Secondary | ICD-10-CM | POA: Diagnosis not present

## 2015-04-01 DIAGNOSIS — I10 Essential (primary) hypertension: Secondary | ICD-10-CM | POA: Diagnosis not present

## 2015-04-01 DIAGNOSIS — E1165 Type 2 diabetes mellitus with hyperglycemia: Secondary | ICD-10-CM | POA: Diagnosis not present

## 2015-04-08 DIAGNOSIS — G894 Chronic pain syndrome: Secondary | ICD-10-CM | POA: Diagnosis not present

## 2015-04-08 DIAGNOSIS — Z79891 Long term (current) use of opiate analgesic: Secondary | ICD-10-CM | POA: Diagnosis not present

## 2015-04-08 DIAGNOSIS — M79606 Pain in leg, unspecified: Secondary | ICD-10-CM | POA: Diagnosis not present

## 2015-04-08 DIAGNOSIS — M545 Low back pain: Secondary | ICD-10-CM | POA: Diagnosis not present

## 2015-04-08 DIAGNOSIS — M542 Cervicalgia: Secondary | ICD-10-CM | POA: Diagnosis not present

## 2015-04-08 DIAGNOSIS — Z79899 Other long term (current) drug therapy: Secondary | ICD-10-CM | POA: Diagnosis not present

## 2015-04-08 DIAGNOSIS — E669 Obesity, unspecified: Secondary | ICD-10-CM | POA: Diagnosis not present

## 2015-04-12 DIAGNOSIS — H578 Other specified disorders of eye and adnexa: Secondary | ICD-10-CM | POA: Diagnosis not present

## 2015-04-12 DIAGNOSIS — L659 Nonscarring hair loss, unspecified: Secondary | ICD-10-CM | POA: Diagnosis not present

## 2015-04-20 DIAGNOSIS — E1151 Type 2 diabetes mellitus with diabetic peripheral angiopathy without gangrene: Secondary | ICD-10-CM | POA: Diagnosis not present

## 2015-04-20 DIAGNOSIS — L603 Nail dystrophy: Secondary | ICD-10-CM | POA: Diagnosis not present

## 2015-04-20 DIAGNOSIS — I739 Peripheral vascular disease, unspecified: Secondary | ICD-10-CM | POA: Diagnosis not present

## 2015-04-22 DIAGNOSIS — I69398 Other sequelae of cerebral infarction: Secondary | ICD-10-CM | POA: Diagnosis not present

## 2015-04-22 DIAGNOSIS — Z79899 Other long term (current) drug therapy: Secondary | ICD-10-CM | POA: Diagnosis not present

## 2015-04-22 DIAGNOSIS — Z7982 Long term (current) use of aspirin: Secondary | ICD-10-CM | POA: Diagnosis not present

## 2015-04-22 DIAGNOSIS — Z8673 Personal history of transient ischemic attack (TIA), and cerebral infarction without residual deficits: Secondary | ICD-10-CM | POA: Diagnosis not present

## 2015-04-22 DIAGNOSIS — Z7901 Long term (current) use of anticoagulants: Secondary | ICD-10-CM | POA: Diagnosis not present

## 2015-04-22 DIAGNOSIS — Z86711 Personal history of pulmonary embolism: Secondary | ICD-10-CM | POA: Diagnosis not present

## 2015-04-22 DIAGNOSIS — R413 Other amnesia: Secondary | ICD-10-CM | POA: Diagnosis not present

## 2015-04-22 DIAGNOSIS — Z955 Presence of coronary angioplasty implant and graft: Secondary | ICD-10-CM | POA: Diagnosis not present

## 2015-04-22 DIAGNOSIS — E119 Type 2 diabetes mellitus without complications: Secondary | ICD-10-CM | POA: Diagnosis not present

## 2015-04-22 DIAGNOSIS — I63219 Cerebral infarction due to unspecified occlusion or stenosis of unspecified vertebral arteries: Secondary | ICD-10-CM | POA: Insufficient documentation

## 2015-04-22 DIAGNOSIS — G4733 Obstructive sleep apnea (adult) (pediatric): Secondary | ICD-10-CM | POA: Diagnosis not present

## 2015-04-22 DIAGNOSIS — I10 Essential (primary) hypertension: Secondary | ICD-10-CM | POA: Diagnosis not present

## 2015-04-22 DIAGNOSIS — Z794 Long term (current) use of insulin: Secondary | ICD-10-CM | POA: Diagnosis not present

## 2015-04-22 DIAGNOSIS — I2510 Atherosclerotic heart disease of native coronary artery without angina pectoris: Secondary | ICD-10-CM | POA: Diagnosis not present

## 2015-04-22 DIAGNOSIS — R260 Ataxic gait: Secondary | ICD-10-CM | POA: Diagnosis not present

## 2015-04-22 DIAGNOSIS — E785 Hyperlipidemia, unspecified: Secondary | ICD-10-CM | POA: Diagnosis not present

## 2015-04-28 DIAGNOSIS — G629 Polyneuropathy, unspecified: Secondary | ICD-10-CM | POA: Diagnosis not present

## 2015-05-07 DIAGNOSIS — I63219 Cerebral infarction due to unspecified occlusion or stenosis of unspecified vertebral arteries: Secondary | ICD-10-CM | POA: Diagnosis not present

## 2015-05-12 DIAGNOSIS — Z0000 Encounter for general adult medical examination without abnormal findings: Secondary | ICD-10-CM | POA: Diagnosis not present

## 2015-05-12 DIAGNOSIS — N390 Urinary tract infection, site not specified: Secondary | ICD-10-CM | POA: Diagnosis not present

## 2015-05-12 DIAGNOSIS — R300 Dysuria: Secondary | ICD-10-CM | POA: Diagnosis not present

## 2015-05-18 ENCOUNTER — Encounter: Payer: Self-pay | Admitting: Adult Health

## 2015-05-18 ENCOUNTER — Ambulatory Visit (INDEPENDENT_AMBULATORY_CARE_PROVIDER_SITE_OTHER): Payer: Medicare Other | Admitting: Adult Health

## 2015-05-18 ENCOUNTER — Telehealth: Payer: Self-pay | Admitting: *Deleted

## 2015-05-18 VITALS — BP 147/79 | HR 58 | Resp 12 | Ht 65.0 in | Wt 187.8 lb

## 2015-05-18 DIAGNOSIS — Z9989 Dependence on other enabling machines and devices: Principal | ICD-10-CM

## 2015-05-18 DIAGNOSIS — G4733 Obstructive sleep apnea (adult) (pediatric): Secondary | ICD-10-CM

## 2015-05-18 NOTE — Progress Notes (Signed)
PATIENT: Steve Brown DOB: [birth year]  REASON FOR VISIT: follow up- [facility]sa on cpap HISTORY FROM: patient  HISTORY OF PRESENT ILLNESS: Steve Brown is a 75 year old male with a history of obstructive sleep apnea on CPAP. He returns today for follow-up. His download indicates that he uses machine 27 out of 30 days for compliance of 90%. He uses machine greater than 4 hours 27 out of 30 days for compliance of 90%. On average he uses his machine 6 hours and 29 minutes. His residual AHI is 1.2 on 9 cm of water with EPR 1. He does have a significant leak in the 95th percentile at 32.3 L/m. Patient states that he still feels somewhat sleepy during the day. He states that he is due to change on his mask and straps. He also reports that he has a hard time sleeping on his back. Therefore when he sleeps on his sides occasionally the pillow will push the mask off his face causing a leak. He reports that he does try to exercise daily. He has continued on Xarelto and aspirin for stroke prevention. His primary care is managing his hypertension and hyperlipidemia. He denies any additional strokelike symptoms. He returns today for an evaluation.  HISTORY 11/18/14: Steve Brown is a 75 year old male with a history of obstructive sleep apnea on CPAP. He returns today for a follow-up. The patient CPAP download indicates that he uses machine 21 out of 30 days for compliance of 70%. He only uses machine 20 out of 30 days for compliance of 67%. On average he uses his machine 6 hours and 2 minutes. His residual AHI is 1.6 on 9 cm of water with EPR 1. He has a leak and the 95th percentile at 26.5 L/m. The patient was recently in the hospital for a TIA event. The patient experienced weakness in the left arm. He called EMS and the weakness resolved prior to their arrival. He was admitted to the hospital CT scan and MRI was negative. Carotid Dopplers were unremarkable. The patient had a 2-D echo in 2015- results are in [place]. The  patient remains on Xarelto after having a PE. He is also on aspirin The patient's blood pressure remains under good control. His primary care manages his hypertension hyperlipidemia and diabetes. He denies any additional strokelike symptoms. He is starting physical therapy this week. He returns today for an evaluation.  HISTORY HISTORY 08/14/13 (CD): Steve Brown is meanwhile 75 years old and has been staying active. He has controlled his weight he is physically active, he also has undergone a complete course of physical therapy since last visit. After out last visit, we had obtained an MRI of the brain without contrast and compared to the study from earlier the same year : there was no change in comparison to the May CT on this MRI dated 07-04-13 .  The patient has few scattered small vessel injuries and some lacunar infarcts.  The main risk factor for these as hypertension and diabetes- Occupational therapy saw him for left arm pain. Additional risk factor is the patient's diabetes mellitus condition. I referred him for occupational therapy to get further insight in what may have caused his arm pain.  He noted that he could left pounds with the left 3 pounds with her right hand now there is still a significant difference .  resulting in the conduction study and EMG study which was then performed on 07-09-13, normal study no evidence of large fiber neuropathy in the left upper  extremity deltoid, biceps, triceps and flexor carpi radialis as well as interosseus Muscles; no abnormal EMG activity.  He underwent a sleep study at [facility] : results from 08-07-13 reviewed today :  Steve Brown was diagnosed with severe sleep apnea at an AHI of 65.9. Lowest point of oxygen saturation was 70%. He was titrated to CPAP at 9 cm water he slept 92.7 minutes at that pressure of which 23 minutes of REM sleep. The AHI was now 0.6 per hour. The previously very fragmented sleep architecture became now essentially normal.  The technologist used a PIC all equal nasal mask. The patient is asked to return in about 50 days for a followup with the CPAP machine.  I also reviewed his long medication list : patient has been on Xeralto for chronic anticoagulation. UPDATE 05/18/14 Steve Brown, who was diagnosed with severe sleep apnea at an AHI of 65.9, has made efforts to increase his CPAP compliance. CPAP was initiated to reduce his secondary CVA risk.  He has used the machine over the last 30 days still only on 18 days, and all these days over 4 hours his compliance is 53% his average user time is 6 hours and 18 minutes his EPR is 1 cm and a set pressure 9 cm water unchanged from the last year. His residual AHI was 0.9 documenting that the setting is effective. Fatigue severity score was only 18 and sleepiness score 6 points again I at contribute to the lower sleepiness score also to his use of CPAP. He stated that he had some sinusitis problems that kept him from using CPAP and has just seen Dr. Ernesto Rutherford who prescribed no antibiotics after other measures had failed. He is reluctant to use an anti-decongestant which I would also not recommend because it can spike his blood pressure. He has Flonase which Dr. Harrington Challenger his primary care physician has prescribed for him. I will see him again in one year for CPAP compliance visit but urged him to try to use the machine every night or at least if he has a medical reason not to use it to discontinue the use for 3 or 4 days until he recovers and then start again. He has also gained some weight which is a risk factor for obstructive sleep apnea alongside his retrognathia. He is now 185 pounds.   REVIEW OF SYSTEMS: Out of a complete 14 system review of symptoms, the patient complains only of the following symptoms, and all other reviewed systems are negative.  Frequency of urination, numbness  ALLERGIES: Allergies  Allergen Reactions  . Phenergan [Promethazine] Other (See Comments)    Mood  changes     HOME MEDICATIONS: Outpatient Prescriptions Prior to Visit  Medication Sig Dispense Refill  . Ascorbic Acid (VITAMIN C PO) Take 1 tablet by mouth daily.    Marland Kitchen aspirin EC 81 MG tablet Take 1 tablet (81 mg total) by mouth daily.    . BD PEN NEEDLE NANO U/F 32G X 4 MM MISC     . chlorhexidine (PERIDEX) 0.12 % solution Use as directed 15 mLs in the mouth or throat 2 (two) times daily.     . cholecalciferol (VITAMIN D) 1000 UNITS tablet Take 1,000 Units by mouth daily.    . diclofenac sodium (VOLTAREN) 1 % GEL Apply 2 g topically 3 (three) times daily as needed (pain).    Marland Kitchen dutasteride (AVODART) 0.5 MG capsule Take 0.5 mg by mouth daily.    . ferrous sulfate 325 (65 FE)  MG tablet Take 325 mg by mouth daily with breakfast.    . fluticasone (FLONASE) 50 MCG/ACT nasal spray Place 2 sprays into both nostrils daily as needed for allergies.     . furosemide (LASIX) 20 MG tablet Take 20 mg by mouth daily as needed (swelling).     Marland Kitchen HUMALOG MIX 75/25 KWIKPEN (75-25) 100 UNIT/ML Kwikpen Inject into the skin. 20 units in am, 18 units in pm.  1  . lactobacillus acidophilus & bulgar (LACTINEX) chewable tablet Chew 1 tablet by mouth 2 (two) times daily.  0  . metFORMIN (GLUCOPHAGE) 500 MG tablet Take 2 tablets (1,000 mg total) by mouth 2 (two) times daily with a meal. (Patient taking differently: Take 500 mg by mouth 2 (two) times daily with a meal. )    . Multiple Vitamin (MULTIVITAMIN WITH MINERALS) TABS Take 1 tablet by mouth every evening.     . nitroGLYCERIN (NITROSTAT) 0.4 MG SL tablet Place 1 tablet (0.4 mg total) under the tongue every 5 (five) minutes as needed for chest pain. 25 tablet 12  . nystatin (MYCOSTATIN) 100000 UNIT/ML suspension   3  . pantoprazole (PROTONIX) 40 MG tablet Take 40 mg by mouth daily.    . prednisoLONE acetate (PRED FORTE) 1 % ophthalmic suspension Place 1 drop into both eyes as needed.     . pregabalin (LYRICA) 300 MG capsule Take 1 capsule (300 mg total) by mouth 2  (two) times daily. 180 capsule 3  . ramipril (ALTACE) 2.5 MG capsule Take 2.5 mg by mouth daily.  1  . Rivaroxaban (XARELTO) 20 MG TABS tablet Take 1 tablet (20 mg total) by mouth daily with supper. (Patient taking differently: Take 20 mg by mouth daily. ) 30 tablet   . rosuvastatin (CRESTOR) 10 MG tablet Take 10 mg by mouth every evening.     . valACYclovir (VALTREX) 500 MG tablet Take 500 mg by mouth daily.    . DULoxetine (CYMBALTA) 60 MG capsule Take 60 mg by mouth daily. Reported on 05/18/2015     No facility-administered medications prior to visit.    PAST MEDICAL HISTORY: Past Medical History  Diagnosis Date  . Diabetes mellitus   . Coronary artery disease   . Hypertension   . History of TIAs   . Lumbago   . Benign localized hyperplasia of prostate without urinary obstruction and other lower urinary tract symptoms (LUTS)   . Other and unspecified hyperlipidemia   . Unspecified essential hypertension   . Type II or unspecified type diabetes mellitus without mention of complication, not stated as uncontrolled   . Personal history of unspecified circulatory disease   . Unspecified fall   . Hypersomnia with sleep apnea, unspecified   . Pain in joint, multiple sites   . Degeneration of cervical intervertebral disc   . Unspecified cardiovascular disease   . Unsteady gait 05/19/2013    X 24 hours - improving. Hx prior CVA/TIAs; multiple risk factors; on Xarelto  . Stroke [facility]) 2002, 2003    both sided weakness  . Trigger finger of both hands 11-17-13    PAST SURGICAL HISTORY: Past Surgical History  Procedure Laterality Date  . Hernia repair    . Carpal tunnel release      bilateral  . Elbow surgery      right elbow - nerve release  . Cervical disc surgery    . Lungs      "fluid pumped off lungs"  . Coronary stent placement  Feb. 2010  .  Cardiac catheterization    . Coronary angioplasty    . Left heart catheterization with coronary angiogram N/A 04/03/2013    Procedure:  LEFT HEART CATHETERIZATION WITH CORONARY ANGIOGRAM;  Surgeon: Jettie Booze, MD;  [facility] C[facility] LAB;  Service: Cardiovascular;  Laterality: N/A;    FAMILY HISTORY: Family History  Problem Relation Age of Onset  . Aneurysm Mother   . Cancer Father     SOCIAL HISTORY: Social History   Social History  . Marital Status: Divorced    Spouse Name: N/A  . Number of Children: 4  . Years of Education: 12   Occupational History  .      retired   Social History Main Topics  . Smoking status: Never Smoker   . Smokeless tobacco: Never Used  . Alcohol Use: No  . Drug Use: No  . Sexual Activity: Not on file   Other Topics Concern  . Not on file   Social History Narrative   Patient lives at home alone and he is single.  Patient is retired.    Caffeine - one cups daily.   Right handed.   Patient has a high school education.   Patient has four adult children.      PHYSICAL EXAM  Filed Vitals:   05/18/15 1110  BP: 147/79  Pulse: 58  Resp: 12  Height: 5\' 5"  (1.651 m)  Weight: 187 lb 12.8 oz (85.186 kg)   Body mass index is 31.25 kg/(m^2).  Generalized: Well developed, in no acute distress   Neurological examination  Mentation: Alert oriented to time, place, history taking. Follows all commands speech and language fluent Cranial nerve II-XII: Pupils were equal round reactive to light. Extraocular movements were full, visual field were full on confrontational test. Facial sensation and strength were normal. Uvula tongue midline. Mallampati 3+ Head turning and shoulder shrug  were normal and symmetric. Motor: The motor testing reveals 5 over 5 strength of all 4 extremities. Good symmetric motor tone is noted throughout.  Sensory: Sensory testing is intact to soft touch on all 4 extremities. No evidence of extinction is noted.  Coordination: Cerebellar testing reveals good finger-nose-finger and heel-to-shin bilaterally.  Gait and station: Gait is normal. Tandem gait  is normal. Romberg is negative. No drift is seen.  Reflexes: Deep tendon reflexes are symmetric and normal bilaterally.   DIAGNOSTIC DATA (LABS, IMAGING, TESTING) - I reviewed patient records, labs, notes, testing and imaging myself where available.  Lab Results  Component Value Date   WBC 4.7 11/03/2014   HGB 12.7* 11/03/2014   HCT 37.0* 11/03/2014   MCV 88.9 11/03/2014   PLT 196 11/03/2014      Component Value Date/Time   NA 138 11/03/2014 1525   NA 140 08/14/2012 1513   K 4.5 11/03/2014 1525   K 4.3 08/14/2012 1513   CL 105 11/03/2014 1525   CO2 28 11/03/2014 1525   CO2 27 08/14/2012 1513   GLUCOSE 222* 11/03/2014 1525   GLUCOSE 262* 08/14/2012 1513   BUN 12 11/03/2014 1525   BUN 19.0 08/14/2012 1513   CREATININE 1.06 11/03/2014 1525   CREATININE 1.29 03/19/2014 1130   CREATININE 1.3 08/14/2012 1513   CALCIUM 9.2 11/03/2014 1525   CALCIUM 9.3 08/14/2012 1513   PROT 5.9* 11/03/2014 1525   PROT 6.9 08/14/2012 1513   ALBUMIN 3.2* 11/03/2014 1525   ALBUMIN 3.4* 08/14/2012 1513   AST 22 11/03/2014 1525   AST 15 08/14/2012 1513   ALT 21 11/03/2014  1525   ALT 14 08/14/2012 1513   ALKPHOS 63 11/03/2014 1525   ALKPHOS 92 08/14/2012 1513   BILITOT 0.5 11/03/2014 1525   BILITOT 0.39 08/14/2012 1513   GFRNONAA >60 11/03/2014 1525   GFRNONAA 67 05/19/2013 1108   GFRAA >60 11/03/2014 1525   GFRAA 78 05/19/2013 1108   Lab Results  Component Value Date   CHOL 144 03/21/2013   HDL 57 03/21/2013   LDLCALC 63 03/21/2013   TRIG 122 03/21/2013   CHOLHDL 2.5 03/21/2013   Lab Results  Component Value Date   HGBA1C 12.2* 03/20/2013    Lab Results  Component Value Date   TSH 0.618 03/20/2013      ASSESSMENT AND PLAN 75 y.o. year old male  has a past medical history of Diabetes mellitus; Coronary artery disease; Hypertension; History of TIAs; Lumbago; Benign localized hyperplasia of prostate without urinary obstruction and other lower urinary tract symptoms (LUTS);  Other and unspecified hyperlipidemia; Unspecified essential hypertension; Type II or unspecified type diabetes mellitus without mention of complication, not stated as uncontrolled; Personal history of unspecified circulatory disease; Unspecified fall; Hypersomnia with sleep apnea, unspecified; Pain in joint, multiple sites; Degeneration of cervical intervertebral disc; Unspecified cardiovascular disease; Unsteady gait (05/19/2013); Stroke [facility]) (2002, 2003); and Trigger finger of both hands (11-17-13). here with:  1. Obstructive sleep apnea on CPAP  Overall the patient is doing well. His compliance is much better at this visit. He is encouraged to continue using the CPAP nightly. I will send in a new prescription for new supplies. He is encouraged to change his mask and straps out every 3 months. If he feels that the mask is still leaking he may need to be refitted. I've also suggested a doughnut-type pillow to prevent the mask from slipping off when he is sleeping on his sides. Patient verbalized understanding. He will follow-up in 6 months or sooner if needed.    Ward Givens, MSN, NP-C 05/18/2015, 11:18 AM [facility] [address] [place], [place] [address] [phone]

## 2015-05-18 NOTE — Patient Instructions (Addendum)
Continue using CPAP nightly  Change out supplies every 3 months Continue to follow with PCP for stroke prevention If your symptoms worsen or you develop new symptoms please let us know.

## 2015-05-18 NOTE — Progress Notes (Signed)
I agree with the assessment and plan as directed by NP .The patient is known to me .   Dajahnae Vondra, MD  

## 2015-05-18 NOTE — Telephone Encounter (Signed)
-----   Message from Leeroy Bock sent at 05/18/2015 12:35 PM EDT ----- Regarding: RE: DME   new supplies  Order has been sent. Thanks Katharine Look!  ----- Message -----    From: Brandon Melnick, RN    Sent: 05/18/2015  12:01 PM      To: Mary A Ozimek Subject: DME   new supplies                             [facility].  FYI  DME order for new cpap supplies for this pt,    Thanks Acupuncturist

## 2015-05-25 DIAGNOSIS — E1140 Type 2 diabetes mellitus with diabetic neuropathy, unspecified: Secondary | ICD-10-CM | POA: Diagnosis not present

## 2015-05-25 DIAGNOSIS — G629 Polyneuropathy, unspecified: Secondary | ICD-10-CM | POA: Diagnosis not present

## 2015-05-25 DIAGNOSIS — Z794 Long term (current) use of insulin: Secondary | ICD-10-CM | POA: Diagnosis not present

## 2015-05-25 DIAGNOSIS — Z0000 Encounter for general adult medical examination without abnormal findings: Secondary | ICD-10-CM | POA: Diagnosis not present

## 2015-05-25 DIAGNOSIS — Z7984 Long term (current) use of oral hypoglycemic drugs: Secondary | ICD-10-CM | POA: Diagnosis not present

## 2015-05-26 DIAGNOSIS — M65332 Trigger finger, left middle finger: Secondary | ICD-10-CM | POA: Diagnosis not present

## 2015-05-26 DIAGNOSIS — M65331 Trigger finger, right middle finger: Secondary | ICD-10-CM | POA: Diagnosis not present

## 2015-05-28 DIAGNOSIS — E119 Type 2 diabetes mellitus without complications: Secondary | ICD-10-CM | POA: Diagnosis not present

## 2015-06-23 DIAGNOSIS — M545 Low back pain: Secondary | ICD-10-CM | POA: Diagnosis not present

## 2015-06-23 DIAGNOSIS — Z79899 Other long term (current) drug therapy: Secondary | ICD-10-CM | POA: Diagnosis not present

## 2015-06-23 DIAGNOSIS — Z79891 Long term (current) use of opiate analgesic: Secondary | ICD-10-CM | POA: Diagnosis not present

## 2015-06-23 DIAGNOSIS — G894 Chronic pain syndrome: Secondary | ICD-10-CM | POA: Diagnosis not present

## 2015-06-23 DIAGNOSIS — M542 Cervicalgia: Secondary | ICD-10-CM | POA: Diagnosis not present

## 2015-06-29 DIAGNOSIS — L603 Nail dystrophy: Secondary | ICD-10-CM | POA: Diagnosis not present

## 2015-06-29 DIAGNOSIS — E1051 Type 1 diabetes mellitus with diabetic peripheral angiopathy without gangrene: Secondary | ICD-10-CM | POA: Diagnosis not present

## 2015-06-29 DIAGNOSIS — I739 Peripheral vascular disease, unspecified: Secondary | ICD-10-CM | POA: Diagnosis not present

## 2015-07-08 DIAGNOSIS — E7800 Pure hypercholesterolemia, unspecified: Secondary | ICD-10-CM | POA: Diagnosis not present

## 2015-07-08 DIAGNOSIS — G609 Hereditary and idiopathic neuropathy, unspecified: Secondary | ICD-10-CM | POA: Diagnosis not present

## 2015-07-08 DIAGNOSIS — E1165 Type 2 diabetes mellitus with hyperglycemia: Secondary | ICD-10-CM | POA: Diagnosis not present

## 2015-07-08 DIAGNOSIS — I10 Essential (primary) hypertension: Secondary | ICD-10-CM | POA: Diagnosis not present

## 2015-07-15 DIAGNOSIS — E1165 Type 2 diabetes mellitus with hyperglycemia: Secondary | ICD-10-CM | POA: Diagnosis not present

## 2015-07-21 DIAGNOSIS — M545 Low back pain: Secondary | ICD-10-CM | POA: Diagnosis not present

## 2015-07-21 DIAGNOSIS — M542 Cervicalgia: Secondary | ICD-10-CM | POA: Diagnosis not present

## 2015-07-21 DIAGNOSIS — E669 Obesity, unspecified: Secondary | ICD-10-CM | POA: Diagnosis not present

## 2015-07-21 DIAGNOSIS — M79606 Pain in leg, unspecified: Secondary | ICD-10-CM | POA: Diagnosis not present

## 2015-07-21 DIAGNOSIS — G894 Chronic pain syndrome: Secondary | ICD-10-CM | POA: Diagnosis not present

## 2015-07-27 DIAGNOSIS — R972 Elevated prostate specific antigen [PSA]: Secondary | ICD-10-CM | POA: Diagnosis not present

## 2015-08-03 DIAGNOSIS — R972 Elevated prostate specific antigen [PSA]: Secondary | ICD-10-CM | POA: Diagnosis not present

## 2015-08-03 DIAGNOSIS — R300 Dysuria: Secondary | ICD-10-CM | POA: Diagnosis not present

## 2015-09-07 DIAGNOSIS — R972 Elevated prostate specific antigen [PSA]: Secondary | ICD-10-CM | POA: Diagnosis not present

## 2015-09-07 DIAGNOSIS — R300 Dysuria: Secondary | ICD-10-CM | POA: Diagnosis not present

## 2015-09-09 ENCOUNTER — Encounter: Payer: Self-pay | Admitting: Neurology

## 2015-10-11 DIAGNOSIS — G894 Chronic pain syndrome: Secondary | ICD-10-CM | POA: Diagnosis not present

## 2015-10-11 DIAGNOSIS — M542 Cervicalgia: Secondary | ICD-10-CM | POA: Diagnosis not present

## 2015-10-11 DIAGNOSIS — E669 Obesity, unspecified: Secondary | ICD-10-CM | POA: Diagnosis not present

## 2015-10-11 DIAGNOSIS — M79606 Pain in leg, unspecified: Secondary | ICD-10-CM | POA: Diagnosis not present

## 2015-10-13 ENCOUNTER — Emergency Department (HOSPITAL_BASED_OUTPATIENT_CLINIC_OR_DEPARTMENT_OTHER): Payer: Medicare Other

## 2015-10-13 ENCOUNTER — Emergency Department (HOSPITAL_BASED_OUTPATIENT_CLINIC_OR_DEPARTMENT_OTHER)
Admission: EM | Admit: 2015-10-13 | Discharge: 2015-10-13 | Discharge status: Against Medical Advice | Payer: Medicare Other | Attending: Emergency Medicine | Admitting: Emergency Medicine

## 2015-10-13 ENCOUNTER — Encounter: Payer: Self-pay | Admitting: Interventional Cardiology

## 2015-10-13 ENCOUNTER — Encounter (HOSPITAL_BASED_OUTPATIENT_CLINIC_OR_DEPARTMENT_OTHER): Payer: Self-pay

## 2015-10-13 DIAGNOSIS — E119 Type 2 diabetes mellitus without complications: Secondary | ICD-10-CM | POA: Diagnosis not present

## 2015-10-13 DIAGNOSIS — I2510 Atherosclerotic heart disease of native coronary artery without angina pectoris: Secondary | ICD-10-CM | POA: Diagnosis not present

## 2015-10-13 DIAGNOSIS — Z7982 Long term (current) use of aspirin: Secondary | ICD-10-CM | POA: Diagnosis not present

## 2015-10-13 DIAGNOSIS — K810 Acute cholecystitis: Secondary | ICD-10-CM

## 2015-10-13 DIAGNOSIS — Z79899 Other long term (current) drug therapy: Secondary | ICD-10-CM | POA: Diagnosis not present

## 2015-10-13 DIAGNOSIS — K8020 Calculus of gallbladder without cholecystitis without obstruction: Secondary | ICD-10-CM | POA: Diagnosis not present

## 2015-10-13 DIAGNOSIS — Z7984 Long term (current) use of oral hypoglycemic drugs: Secondary | ICD-10-CM | POA: Diagnosis not present

## 2015-10-13 DIAGNOSIS — R1011 Right upper quadrant pain: Secondary | ICD-10-CM | POA: Diagnosis not present

## 2015-10-13 DIAGNOSIS — Z8673 Personal history of transient ischemic attack (TIA), and cerebral infarction without residual deficits: Secondary | ICD-10-CM | POA: Insufficient documentation

## 2015-10-13 DIAGNOSIS — I10 Essential (primary) hypertension: Secondary | ICD-10-CM | POA: Insufficient documentation

## 2015-10-13 DIAGNOSIS — R109 Unspecified abdominal pain: Secondary | ICD-10-CM | POA: Diagnosis not present

## 2015-10-13 LAB — COMPREHENSIVE METABOLIC PANEL
ALT: 17 U/L (ref 17–63)
AST: 23 U/L (ref 15–41)
Albumin: 3.8 g/dL (ref 3.5–5.0)
Alkaline Phosphatase: 48 U/L (ref 38–126)
Anion gap: 6 (ref 5–15)
BUN: 20 mg/dL (ref 6–20)
CO2: 26 mmol/L (ref 22–32)
Calcium: 9.2 mg/dL (ref 8.9–10.3)
Chloride: 103 mmol/L (ref 101–111)
Creatinine, Ser: 1.23 mg/dL (ref 0.61–1.24)
GFR calc Af Amer: >60 mL/min (ref >60)
GFR calc non Af Amer: 56 mL/min — ABNORMAL LOW (ref >60)
Glucose, Bld: 243 mg/dL — ABNORMAL HIGH (ref 65–99)
Potassium: 5.2 mmol/L — ABNORMAL HIGH (ref 3.5–5.1)
Sodium: 135 mmol/L (ref 135–145)
Total Bilirubin: 1 mg/dL (ref 0.3–1.2)
Total Protein: 7.2 g/dL (ref 6.5–8.1)

## 2015-10-13 LAB — CBC WITH DIFFERENTIAL/PLATELET
Basophils Absolute: 0 10*3/uL (ref 0.0–0.1)
Basophils Relative: 0 %
Eosinophils Absolute: 0 10*3/uL (ref 0.0–0.7)
Eosinophils Relative: 0 %
HCT: 40.6 % (ref 39.0–52.0)
Hemoglobin: 13.7 g/dL (ref 13.0–17.0)
Lymphocytes Relative: 15 %
Lymphs Abs: 1.5 10*3/uL (ref 0.7–4.0)
MCH: 30.5 pg (ref 26.0–34.0)
MCHC: 33.7 g/dL (ref 30.0–36.0)
MCV: 90.4 fL (ref 78.0–100.0)
Monocytes Absolute: 1.1 10*3/uL — ABNORMAL HIGH (ref 0.1–1.0)
Monocytes Relative: 11 %
Neutro Abs: 7.3 10*3/uL (ref 1.7–7.7)
Neutrophils Relative %: 74 %
Platelets: 183 10*3/uL (ref 150–400)
RBC: 4.49 MIL/uL (ref 4.22–5.81)
RDW: 12.8 % (ref 11.5–15.5)
WBC: 10 10*3/uL (ref 4.0–10.5)

## 2015-10-13 LAB — LIPASE, BLOOD: Lipase: 34 U/L (ref 11–51)

## 2015-10-13 MED ORDER — IOPAMIDOL (ISOVUE-300) INJECTION 61%
100.0000 mL | Freq: Once | INTRAVENOUS | Status: AC | PRN
  Administered 2015-10-13: 100 mL via INTRAVENOUS

## 2015-10-13 MED ORDER — ACETAMINOPHEN 500 MG PO TABS
1000.0000 mg | ORAL_TABLET | Freq: Once | ORAL | Status: AC
  Administered 2015-10-13: 1000 mg via ORAL
  Filled 2015-10-13: qty 2

## 2015-10-13 MED ORDER — ONDANSETRON 4 MG PO TBDP: ORAL_TABLET | ORAL | 0 refills | Status: DC

## 2015-10-13 MED ORDER — AMOXICILLIN-POT CLAVULANATE ER 1000-62.5 MG PO TB12: 2.0000 | ORAL_TABLET | Freq: Two times a day (BID) | ORAL | 0 refills | Status: DC

## 2015-10-13 MED ORDER — SODIUM CHLORIDE 0.9 % IV BOLUS (SEPSIS)
1000.0000 mL | Freq: Once | INTRAVENOUS | Status: AC
  Administered 2015-10-13: 1000 mL via INTRAVENOUS

## 2015-10-13 NOTE — ED Triage Notes (Signed)
C/o abd pain started yesterday-denies n/v/d-sent from PCP office-NAD-steady gait

## 2015-10-13 NOTE — ED Notes (Signed)
MD at bedside. 

## 2015-10-13 NOTE — ED Notes (Signed)
Patient transported to Ultrasound 

## 2015-10-13 NOTE — ED Provider Notes (Signed)
[facility] DE[facility] Provider Note   CSN: [id number] Arrival date & time: 10/13/15  1607     History   Chief Complaint Chief Complaint  Patient presents with  . Abdominal Pain    HPI Steve Brown is a 75 y.o. male.  75 yo M with a chief complaint of diffuse lower abdominal pain. Going on for the past week or so. Associated with constipation. Patient is passing flatus denies nausea or vomiting. Patient has been having some very small hard stools but no large normal bowel movement for at least a week. Was seen by his family physician today who is concerned for possible bowel obstruction and sent the patient here.   The history is provided by the patient.  Abdominal Pain   This is a new problem. The current episode started less than 1 hour ago. The problem occurs constantly. The problem has been gradually worsening. The pain is associated with eating. The pain is at a severity of 6/10. The pain is moderate. Associated symptoms include constipation (for at least a week). Pertinent negatives include fever, diarrhea, vomiting, headaches, arthralgias and myalgias. Nothing aggravates the symptoms. Nothing relieves the symptoms.    Past Medical History:  Diagnosis Date  . Benign localized hyperplasia of prostate without urinary obstruction and other lower urinary tract symptoms (LUTS)   . Coronary artery disease   . Degeneration of cervical intervertebral disc   . Diabetes mellitus   . History of TIAs   . Hypersomnia with sleep apnea, unspecified   . Hypertension   . Lumbago   . Other and unspecified hyperlipidemia   . Pain in joint, multiple sites   . Personal history of unspecified circulatory disease   . Stroke [facility]) 2002, 2003   both sided weakness  . Trigger finger of both hands 11-17-13  . Type II or unspecified type diabetes mellitus without mention of complication, not stated as uncontrolled   . Unspecified cardiovascular disease   . Unspecified essential  hypertension   . Unspecified fall   . Unsteady gait 05/19/2013   X 24 hours - improving. Hx prior CVA/TIAs; multiple risk factors; on Xarelto    Patient Active Problem List   Diagnosis Date Noted  . TIA (transient ischemic attack) 11/03/2014  . OSA on CPAP 05/18/2014  . White matter disease 08/14/2013  . OSA (obstructive sleep apnea) 08/14/2013  . Unsteady gait 05/19/2013  . Mixed hyperlipidemia 04/24/2013  . Syncope 03/20/2013  . Chest pain 10/08/2012  . Hypertension   . Stroke ([place])   . History of TIAs   . Lumbago   . Other and unspecified hyperlipidemia   . Personal history of unspecified circulatory disease   . Unspecified fall   . Pain in joint, multiple sites   . Degeneration of cervical intervertebral disc   . Unspecified cardiovascular disease   . History of pulmonary embolism: June 2014,  Takes Xarelto 07/01/2012    Class: History of  . Hemiparesis ([place]) 07/18/2011  . Diabetes mellitus type 2 with complications ([place]) 99991111  . HTN (hypertension) 07/18/2011  . CAD in native artery 07/18/2011  . History of recurrent TIAs 07/18/2011    Past Surgical History:  Procedure Laterality Date  . CARDIAC CATHETERIZATION    . CARPAL TUNNEL RELEASE     bilateral  . CERVICAL DISC SURGERY    . CORONARY ANGIOPLASTY    . CORONARY STENT PLACEMENT  Feb. 2010  . ELBOW SURGERY     right elbow - nerve release  .  HERNIA REPAIR    . LEFT HEART CATHETERIZATION WITH CORONARY ANGIOGRAM N/A 04/03/2013   Procedure: LEFT HEART CATHETERIZATION WITH CORONARY ANGIOGRAM;  Surgeon: Jettie Booze, MD;  Location: [facility] CATH LAB;  Service: Cardiovascular;  Laterality: N/A;  . lungs     "fluid pumped off lungs"       Home Medications    Prior to Admission medications   Medication Sig Start Date End Date Taking? Authorizing Provider  amoxicillin-clavulanate (AUGMENTIN XR) 1000-62.5 MG 12 hr tablet Take 2 tablets by mouth 2 (two) times daily. 10/13/15   Deno Etienne, DO  Ascorbic Acid  (VITAMIN C PO) Take 1 tablet by mouth daily.    Historical Provider, MD  aspirin EC 81 MG tablet Take 1 tablet (81 mg total) by mouth daily. 11/05/14   Nita Sells, MD  BD PEN NEEDLE NANO U/F 32G X 4 MM MISC  09/01/13   Historical Provider, MD  chlorhexidine (PERIDEX) 0.12 % solution Use as directed 15 mLs in the mouth or throat 2 (two) times daily.  07/25/13   Historical Provider, MD  cholecalciferol (VITAMIN D) 1000 UNITS tablet Take 1,000 Units by mouth daily.    Historical Provider, MD  diclofenac sodium (VOLTAREN) 1 % GEL Apply 2 g topically 3 (three) times daily as needed (pain).    Historical Provider, MD  dutasteride (AVODART) 0.5 MG capsule Take 0.5 mg by mouth daily.    Historical Provider, MD  ferrous sulfate 325 (65 FE) MG tablet Take 325 mg by mouth daily with breakfast.    Historical Provider, MD  fluticasone (FLONASE) 50 MCG/ACT nasal spray Place 2 sprays into both nostrils daily as needed for allergies.  05/30/13   Historical Provider, MD  furosemide (LASIX) 20 MG tablet Take 20 mg by mouth daily as needed (swelling).     Historical Provider, MD  HUMALOG MIX 75/25 KWIKPEN (75-25) 100 UNIT/ML Kwikpen Inject into the skin. 20 units in am, 18 units in pm. 10/21/13   Historical Provider, MD  lactobacillus acidophilus & bulgar (LACTINEX) chewable tablet Chew 1 tablet by mouth 2 (two) times daily. 05/19/14   Historical Provider, MD  metFORMIN (GLUCOPHAGE) 500 MG tablet Take 2 tablets (1,000 mg total) by mouth 2 (two) times daily with a meal. Patient taking differently: Take 500 mg by mouth 2 (two) times daily with a meal.  04/05/13   Jettie Booze, MD  Multiple Vitamin (MULTIVITAMIN WITH MINERALS) TABS Take 1 tablet by mouth every evening.     Historical Provider, MD  nitroGLYCERIN (NITROSTAT) 0.4 MG SL tablet Place 1 tablet (0.4 mg total) under the tongue every 5 (five) minutes as needed for chest pain. 04/03/13   Jettie Booze, MD  nystatin (MYCOSTATIN) 100000 UNIT/ML  suspension  10/09/13   Historical Provider, MD  ondansetron (ZOFRAN ODT) 4 MG disintegrating tablet 4mg  ODT q4 hours prn nausea/vomit 10/13/15   Deno Etienne, DO  pantoprazole (PROTONIX) 40 MG tablet Take 40 mg by mouth daily.    Historical Provider, MD  prednisoLONE acetate (PRED FORTE) 1 % ophthalmic suspension Place 1 drop into both eyes as needed.  09/05/13   Historical Provider, MD  pregabalin (LYRICA) 300 MG capsule Take 1 capsule (300 mg total) by mouth 2 (two) times daily. 07/26/12   Asencion Partridge Dohmeier, MD  ramipril (ALTACE) 2.5 MG capsule Take 2.5 mg by mouth daily. 09/08/14   Historical Provider, MD  Rivaroxaban (XARELTO) 20 MG TABS tablet Take 1 tablet (20 mg total) by mouth daily with supper. Patient  taking differently: Take 20 mg by mouth daily.  04/05/13   Jettie Booze, MD  rosuvastatin (CRESTOR) 10 MG tablet Take 10 mg by mouth every evening.     Historical Provider, MD  valACYclovir (VALTREX) 500 MG tablet Take 500 mg by mouth daily.    Historical Provider, MD    Family History Family History  Problem Relation Age of Onset  . Aneurysm Mother   . Cancer Father     Social History Social History  Substance Use Topics  . Smoking status: Never Smoker  . Smokeless tobacco: Never Used  . Alcohol use No     Allergies   Phenergan [promethazine]   Review of Systems Review of Systems  Constitutional: Negative for chills and fever.  HENT: Negative for congestion and facial swelling.   Eyes: Negative for discharge and visual disturbance.  Respiratory: Negative for shortness of breath.   Cardiovascular: Negative for chest pain and palpitations.  Gastrointestinal: Positive for abdominal pain and constipation (for at least a week). Negative for diarrhea and vomiting.  Musculoskeletal: Negative for arthralgias and myalgias.  Skin: Negative for color change and rash.  Neurological: Negative for tremors, syncope and headaches.  Psychiatric/Behavioral: Negative for confusion and  dysphoric mood.     Physical Exam Updated Vital Signs BP 131/64   Pulse (!) 52   Temp 97.5 F (36.4 C) (Oral)   Resp 18   Ht 5\' 4"  (1.626 m)   Wt 182 lb (82.6 kg)   SpO2 93%   BMI 31.24 kg/m   Physical Exam  Constitutional: He is oriented to person, place, and time. He appears well-developed and well-nourished.  HENT:  Head: Normocephalic and atraumatic.  Eyes: EOM are normal. Pupils are equal, round, and reactive to light.  Neck: Normal range of motion. Neck supple. No JVD present.  Cardiovascular: Normal rate and regular rhythm.  Exam reveals no gallop and no friction rub.   No murmur heard. Pulmonary/Chest: No respiratory distress. He has no wheezes.  Abdominal: He exhibits no distension and no mass. There is tenderness (mild diffuse, lower.  Some TTP to the RUQ, negative muprhys). There is guarding (ruq). There is no rebound.  Musculoskeletal: Normal range of motion.  Neurological: He is alert and oriented to person, place, and time.  Skin: No rash noted. No pallor.  Psychiatric: He has a normal mood and affect. His behavior is normal.  Nursing note and vitals reviewed.    ED Treatments / Results  Labs (all labs ordered are listed, but only abnormal results are displayed) Labs Reviewed  CBC WITH DIFFERENTIAL/PLATELET - Abnormal; Notable for the following:       Result Value   Monocytes Absolute 1.1 (*)    All other components within normal limits  COMPREHENSIVE METABOLIC PANEL - Abnormal; Notable for the following:    Potassium 5.2 (*)    Glucose, Bld 243 (*)    GFR calc non Af Amer 56 (*)    All other components within normal limits  LIPASE, BLOOD    EKG  EKG Interpretation None       Radiology Ct Abdomen Pelvis W Contrast  Result Date: 10/13/2015 CLINICAL DATA:  Abdominal pain for 2 days EXAM: CT ABDOMEN AND PELVIS WITH CONTRAST TECHNIQUE: Multidetector CT imaging of the abdomen and pelvis was performed using the standard protocol following bolus  administration of intravenous contrast. CONTRAST:  170mL ISOVUE-300 IOPAMIDOL (ISOVUE-300) INJECTION 61% COMPARISON:  03/20/2014 FINDINGS: Lower chest: No acute abnormality. Coronary calcifications are noted. Hepatobiliary:  There is a rounded hypodense lesion identified in the right lobe of the liver near the dome stable from the prior exam with peripheral enhancement consistent with small hepatic hemangioma. Gallbladder is well distended without evidence of cholelithiasis. Some mild pericholecystic fluid is seen. Pancreas: Unremarkable. No pancreatic ductal dilatation or surrounding inflammatory changes. Spleen: Normal in size without focal abnormality. Adrenals/Urinary Tract: The adrenal glands are within normal limits. No renal calculi or obstructive changes are seen. Bilateral renal cysts are seen stable from the prior exam. Bladder is well distended. A small diverticulum is noted adjacent to the bladder on the left. Stomach/Bowel: Stomach is within normal limits. Appendix appears normal. No evidence of bowel wall thickening, distention, or inflammatory changes. Vascular/Lymphatic: Aortic atherosclerosis. No enlarged abdominal or pelvic lymph nodes. Reproductive: Prostate is unremarkable. Other: No abdominal wall hernia or abnormality. No abdominopelvic ascites. Musculoskeletal: Degenerative changes of lumbar spine are seen. IMPRESSION: Distended gallbladder with pericholecystic fluid. This may represent acute cholecystitis. Ultrasound may be helpful for further evaluation. Stable changes in the kidneys Stable hepatic hemangioma. Electronically Signed   By: Inez Catalina M.D.   On: 10/13/2015 19:04   US Abdomen Limited Ruq  Result Date: 10/13/2015 CLINICAL DATA:  Abnormal gallbladder on recent CT EXAM: US ABDOMEN LIMITED - RIGHT UPPER QUADRANT COMPARISON:  CT from earlier in the same day FINDINGS: Gallbladder: Gallbladder wall thickening is noted to 1 cm. Cholelithiasis and gallbladder sludge is noted.  Positive sonographic Percell Miller sign is noted although diffuse abdominal pain is noted as well making this less specific. Common bile duct: Diameter: 7 mm Liver: No focal lesion identified. Within normal limits in parenchymal echogenicity. IMPRESSION: Changes consistent with acute cholecystitis. Cholelithiasis and gallbladder sludge is noted as well. Electronically Signed   By: Inez Catalina M.D.   On: 10/13/2015 21:00    Procedures Procedures (including critical care time)  Medications Ordered in ED Medications  sodium chloride 0.9 % bolus 1,000 mL (0 mLs Intravenous Stopped 10/13/15 1925)  acetaminophen (TYLENOL) tablet 1,000 mg (1,000 mg Oral Given 10/13/15 1642)  iopamidol (ISOVUE-300) 61 % injection 100 mL (100 mLs Intravenous Contrast Given 10/13/15 1827)     Initial Impression / Assessment and Plan / ED Course  I have reviewed the triage vital signs and the nursing notes.  Pertinent labs & imaging results that were available during my care of the patient were reviewed by me and considered in my medical decision making (see chart for details).  Clinical Course    75 yo M With a chief complaint of diffuse abdominal pain and constipation. Patient with very mild lower abdominal tenderness some distention. He was sent from family [facility] office with concern for small bowel obstruction obtain a CT scan with contrast.  CT scan with concern for acute cholecystitis. Obtain an ultrasound that was consistent with the same. I discussed the results with the patient, he does not feel ready to go to surgery or stand the hospital. He states that he would like to go home and think about it for a day or 2. I discussed the patient that the concern would be that he would have an acute perforation of his gallbladder making the surgery much more complicated and leaving him to be very sick and possibly kill him. The patient understands this risk and feels that he is not ready first day in the hospital. I told  patient he is more than welcome to return at any time I discussed the antibiotic choice with the pharmacist on call.  Given a number to call a Surgeon first thing in the morning.  9:48 PM:  I have discussed the diagnosis/risks/treatment options with the patient and family and believe the pt to be eligible for discharge home to follow-up with General surgery. We also discussed returning to the ED immediately if new or worsening sx occur. We discussed the sx which are most concerning (e.g., sudden worsening pain, fever, inability to tolerate by mouth) that necessitate immediate return. Medications administered to the patient during their visit and any new prescriptions provided to the patient are listed below.  Medications given during this visit Medications  sodium chloride 0.9 % bolus 1,000 mL (0 mLs Intravenous Stopped 10/13/15 1925)  acetaminophen (TYLENOL) tablet 1,000 mg (1,000 mg Oral Given 10/13/15 1642)  iopamidol (ISOVUE-300) 61 % injection 100 mL (100 mLs Intravenous Contrast Given 10/13/15 1827)     The patient appears reasonably screen and/or stabilized for discharge and I doubt any other medical condition or other [facility] requiring further screening, evaluation, or treatment in the ED at this time prior to discharge.    Final Clinical Impressions(s) / ED Diagnoses   Final diagnoses:  RUQ abdominal pain  Acute cholecystitis    New Prescriptions New Prescriptions   AMOXICILLIN-CLAVULANATE (AUGMENTIN XR) 1000-62.5 MG 12 HR TABLET    Take 2 tablets by mouth 2 (two) times daily.   ONDANSETRON (ZOFRAN ODT) 4 MG DISINTEGRATING TABLET    4mg  ODT q4 hours prn nausea/vomit     Deno Etienne, DO 10/13/15 2148

## 2015-10-13 NOTE — Discharge Instructions (Signed)
Call the surgeon first thing in the morning. Tell them your diagnosed with acute infected gallbladder and asked them if they want to see you in the office this morning or in one of the emergency departments. Return for any worsening symptoms fever vomiting.

## 2015-10-15 ENCOUNTER — Ambulatory Visit: Payer: Self-pay | Admitting: Surgery

## 2015-10-15 ENCOUNTER — Other Ambulatory Visit: Payer: Self-pay | Admitting: Surgery

## 2015-10-15 DIAGNOSIS — K8010 Calculus of gallbladder with chronic cholecystitis without obstruction: Secondary | ICD-10-CM | POA: Diagnosis not present

## 2015-10-15 NOTE — H&P (Signed)
Steve Brown is an 75 y.o. male.   Chief Complaint: Abdominal pain HPI: This is a 75 yo male who presented to the ED on 10/13/15 with diffuse lower abdominal pain.  This has been present for about a week.  This is associated with some constipation.  The patient denies any nausea or vomiting.  The pain comes in waves.    The patient is on Xarelto, but he has not taken it since 10/13/15.   He underwent workup in the emergency department including CT scan and ultrasound.  This showed cholelithiasis with some pericholecystic fluid.  Direct admission and urgent cholecystectomy were recommended to the patient but he declined.  Today he feels well.  He has minimal abdominal tenderness.  He has been eating.  Bowel movements have been mildly constipated but otherwise normal.  He denies any nausea.  No fever.  Past Medical History:  Diagnosis Date  . Benign localized hyperplasia of prostate without urinary obstruction and other lower urinary tract symptoms (LUTS)   . Coronary artery disease   . Degeneration of cervical intervertebral disc   . Diabetes mellitus   . History of TIAs   . Hypersomnia with sleep apnea, unspecified   . Hypertension   . Lumbago   . Other and unspecified hyperlipidemia   . Pain in joint, multiple sites   . Personal history of unspecified circulatory disease   . Stroke (HCC) 2002, 2003   both sided weakness  . Trigger finger of both hands 11-17-13  . Type II or unspecified type diabetes mellitus without mention of complication, not stated as uncontrolled   . Unspecified cardiovascular disease   . Unspecified essential hypertension   . Unspecified fall   . Unsteady gait 05/19/2013   X 24 hours - improving. Hx prior CVA/TIAs; multiple risk factors; on Xarelto    Past Surgical History:  Procedure Laterality Date  . CARDIAC CATHETERIZATION    . CARPAL TUNNEL RELEASE     bilateral  . CERVICAL DISC SURGERY    . CORONARY ANGIOPLASTY    . CORONARY STENT PLACEMENT  Feb. 2010   . ELBOW SURGERY     right elbow - nerve release  . HERNIA REPAIR    . LEFT HEART CATHETERIZATION WITH CORONARY ANGIOGRAM N/A 04/03/2013   Procedure: LEFT HEART CATHETERIZATION WITH CORONARY ANGIOGRAM;  Surgeon: Jayadeep S Varanasi, MD;  Location: [facility] CATH LAB;  Service: Cardiovascular;  Laterality: N/A;  . lungs     "fluid pumped off lungs"    Family History  Problem Relation Age of Onset  . Aneurysm Mother   . Cancer Father    Social History:  reports that he has never smoked. He has never used smokeless tobacco. He reports that he does not drink alcohol or use drugs.  Allergies:  Allergies  Allergen Reactions  . Phenergan [Promethazine] Other (See Comments)    Mood changes    Prior to Admission medications   Medication Sig Start Date End Date Taking? Authorizing Provider  amoxicillin-clavulanate (AUGMENTIN XR) 1000-62.5 MG 12 hr tablet Take 2 tablets by mouth 2 (two) times daily. 10/13/15   Dan Floyd, DO  Ascorbic Acid (VITAMIN C PO) Take 1 tablet by mouth daily.    Historical Provider, MD  aspirin EC 81 MG tablet Take 1 tablet (81 mg total) by mouth daily. 11/05/14   Jai-Gurmukh Samtani, MD  BD PEN NEEDLE NANO U/F 32G X 4 MM MISC  09/01/13   Historical Provider, MD  chlorhexidine (PERIDEX) 0.12 % solution Use   as directed 15 mLs in the mouth or throat 2 (two) times daily.  07/25/13   Historical Provider, MD  cholecalciferol (VITAMIN D) 1000 UNITS tablet Take 1,000 Units by mouth daily.    Historical Provider, MD  diclofenac sodium (VOLTAREN) 1 % GEL Apply 2 g topically 3 (three) times daily as needed (pain).    Historical Provider, MD  dutasteride (AVODART) 0.5 MG capsule Take 0.5 mg by mouth daily.    Historical Provider, MD  ferrous sulfate 325 (65 FE) MG tablet Take 325 mg by mouth daily with breakfast.    Historical Provider, MD  fluticasone (FLONASE) 50 MCG/ACT nasal spray Place 2 sprays into both nostrils daily as needed for allergies.  05/30/13   Historical Provider, MD   furosemide (LASIX) 20 MG tablet Take 20 mg by mouth daily as needed (swelling).     Historical Provider, MD  HUMALOG MIX 75/25 KWIKPEN (75-25) 100 UNIT/ML Kwikpen Inject into the skin. 20 units in am, 18 units in pm. 10/21/13   Historical Provider, MD  lactobacillus acidophilus & bulgar (LACTINEX) chewable tablet Chew 1 tablet by mouth 2 (two) times daily. 05/19/14   Historical Provider, MD  metFORMIN (GLUCOPHAGE) 500 MG tablet Take 2 tablets (1,000 mg total) by mouth 2 (two) times daily with a meal. Patient taking differently: Take 500 mg by mouth 2 (two) times daily with a meal.  04/05/13   Jayadeep S Varanasi, MD  Multiple Vitamin (MULTIVITAMIN WITH MINERALS) TABS Take 1 tablet by mouth every evening.     Historical Provider, MD  nitroGLYCERIN (NITROSTAT) 0.4 MG SL tablet Place 1 tablet (0.4 mg total) under the tongue every 5 (five) minutes as needed for chest pain. 04/03/13   Jayadeep S Varanasi, MD  nystatin (MYCOSTATIN) 100000 UNIT/ML suspension  10/09/13   Historical Provider, MD  ondansetron (ZOFRAN ODT) 4 MG disintegrating tablet 4mg ODT q4 hours prn nausea/vomit 10/13/15   Dan Floyd, DO  pantoprazole (PROTONIX) 40 MG tablet Take 40 mg by mouth daily.    Historical Provider, MD  prednisoLONE acetate (PRED FORTE) 1 % ophthalmic suspension Place 1 drop into both eyes as needed.  09/05/13   Historical Provider, MD  pregabalin (LYRICA) 300 MG capsule Take 1 capsule (300 mg total) by mouth 2 (two) times daily. 07/26/12   Carmen Dohmeier, MD  ramipril (ALTACE) 2.5 MG capsule Take 2.5 mg by mouth daily. 09/08/14   Historical Provider, MD  Rivaroxaban (XARELTO) 20 MG TABS tablet Take 1 tablet (20 mg total) by mouth daily with supper. Patient taking differently: Take 20 mg by mouth daily.  04/05/13   Jayadeep S Varanasi, MD  rosuvastatin (CRESTOR) 10 MG tablet Take 10 mg by mouth every evening.     Historical Provider, MD  valACYclovir (VALTREX) 500 MG tablet Take 500 mg by mouth daily.    Historical  Provider, MD     Results for orders placed or performed during the hospital encounter of 10/13/15 (from the past 48 hour(s))  CBC with Differential     Status: Abnormal   Collection Time: 10/13/15  4:30 PM  Result Value Ref Range   WBC 10.0 4.0 - 10.5 K/uL   RBC 4.49 4.22 - 5.81 MIL/uL   Hemoglobin 13.7 13.0 - 17.0 g/dL   HCT 40.6 39.0 - 52.0 %   MCV 90.4 78.0 - 100.0 fL   MCH 30.5 26.0 - 34.0 pg   MCHC 33.7 30.0 - 36.0 g/dL   RDW 12.8 11.5 - 15.5 %   Platelets   183 150 - 400 K/uL   Neutrophils Relative % 74 %   Neutro Abs 7.3 1.7 - 7.7 K/uL   Lymphocytes Relative 15 %   Lymphs Abs 1.5 0.7 - 4.0 K/uL   Monocytes Relative 11 %   Monocytes Absolute 1.1 (H) 0.1 - 1.0 K/uL   Eosinophils Relative 0 %   Eosinophils Absolute 0.0 0.0 - 0.7 K/uL   Basophils Relative 0 %   Basophils Absolute 0.0 0.0 - 0.1 K/uL  Comprehensive metabolic panel     Status: Abnormal   Collection Time: 10/13/15  4:30 PM  Result Value Ref Range   Sodium 135 135 - 145 mmol/L   Potassium 5.2 (H) 3.5 - 5.1 mmol/L   Chloride 103 101 - 111 mmol/L   CO2 26 22 - 32 mmol/L   Glucose, Bld 243 (H) 65 - 99 mg/dL   BUN 20 6 - 20 mg/dL   Creatinine, Ser 1.23 0.61 - 1.24 mg/dL   Calcium 9.2 8.9 - 10.3 mg/dL   Total Protein 7.2 6.5 - 8.1 g/dL   Albumin 3.8 3.5 - 5.0 g/dL   AST 23 15 - 41 U/L   ALT 17 17 - 63 U/L   Alkaline Phosphatase 48 38 - 126 U/L   Total Bilirubin 1.0 0.3 - 1.2 mg/dL   GFR calc non Af Amer 56 (L) >60 mL/min   GFR calc Af Amer >60 >60 mL/min    Comment: (NOTE) The eGFR has been calculated using the CKD EPI equation. This calculation has not been validated in all clinical situations. eGFR's persistently <60 mL/min signify possible Chronic Kidney Disease.    Anion gap 6 5 - 15  Lipase, blood     Status: None   Collection Time: 10/13/15  4:30 PM  Result Value Ref Range   Lipase 34 11 - 51 U/L   Ct Abdomen Pelvis W Contrast  Result Date: 10/13/2015 CLINICAL DATA:  Abdominal pain for 2 days  EXAM: CT ABDOMEN AND PELVIS WITH CONTRAST TECHNIQUE: Multidetector CT imaging of the abdomen and pelvis was performed using the standard protocol following bolus administration of intravenous contrast. CONTRAST:  100mL ISOVUE-300 IOPAMIDOL (ISOVUE-300) INJECTION 61% COMPARISON:  03/20/2014 FINDINGS: Lower chest: No acute abnormality. Coronary calcifications are noted. Hepatobiliary: There is a rounded hypodense lesion identified in the right lobe of the liver near the dome stable from the prior exam with peripheral enhancement consistent with small hepatic hemangioma. Gallbladder is well distended without evidence of cholelithiasis. Some mild pericholecystic fluid is seen. Pancreas: Unremarkable. No pancreatic ductal dilatation or surrounding inflammatory changes. Spleen: Normal in size without focal abnormality. Adrenals/Urinary Tract: The adrenal glands are within normal limits. No renal calculi or obstructive changes are seen. Bilateral renal cysts are seen stable from the prior exam. Bladder is well distended. A small diverticulum is noted adjacent to the bladder on the left. Stomach/Bowel: Stomach is within normal limits. Appendix appears normal. No evidence of bowel wall thickening, distention, or inflammatory changes. Vascular/Lymphatic: Aortic atherosclerosis. No enlarged abdominal or pelvic lymph nodes. Reproductive: Prostate is unremarkable. Other: No abdominal wall hernia or abnormality. No abdominopelvic ascites. Musculoskeletal: Degenerative changes of lumbar spine are seen. IMPRESSION: Distended gallbladder with pericholecystic fluid. This may represent acute cholecystitis. Ultrasound may be helpful for further evaluation. Stable changes in the kidneys Stable hepatic hemangioma. Electronically Signed   By: Mark  Lukens M.D.   On: 10/13/2015 19:04   Us Abdomen Limited Ruq  Result Date: 10/13/2015 CLINICAL DATA:  Abnormal gallbladder on recent CT   EXAM: US ABDOMEN LIMITED - RIGHT UPPER QUADRANT  COMPARISON:  CT from earlier in the same day FINDINGS: Gallbladder: Gallbladder wall thickening is noted to 1 cm. Cholelithiasis and gallbladder sludge is noted. Positive sonographic Murphy sign is noted although diffuse abdominal pain is noted as well making this less specific. Common bile duct: Diameter: 7 mm Liver: No focal lesion identified. Within normal limits in parenchymal echogenicity. IMPRESSION: Changes consistent with acute cholecystitis. Cholelithiasis and gallbladder sludge is noted as well. Electronically Signed   By: Mark  Lukens M.D.   On: 10/13/2015 21:00    Review of Systems  Constitutional: Negative for weight loss.  HENT: Negative for ear discharge, ear pain, hearing loss and tinnitus.   Eyes: Negative for blurred vision, double vision, photophobia and pain.  Respiratory: Negative for cough, sputum production and shortness of breath.   Cardiovascular: Negative for chest pain.  Gastrointestinal: Positive for abdominal pain and constipation. Negative for nausea and vomiting.  Genitourinary: Negative for dysuria, flank pain, frequency and urgency.  Musculoskeletal: Negative for back pain, falls, joint pain, myalgias and neck pain.  Neurological: Negative for dizziness, tingling, sensory change, focal weakness, loss of consciousness and headaches.  Endo/Heme/Allergies: Does not bruise/bleed easily.  Psychiatric/Behavioral: Negative for depression, memory loss and substance abuse. The patient is not nervous/anxious.    Physical Exam  WDWN in NAD Eyes:  Pupils equal, round; sclera anicteric HENT:  Oral mucosa moist; good dentition  Neck:  No masses palpated, no thyromegaly Lungs:  CTA bilaterally; normal respiratory effort CV:  Regular rate and rhythm; no murmurs; extremities well-perfused with no edema Abd:  +bowel sounds, soft, protuberant; large upper midline rectus diastasis; healed umbilical hernia scar with no sign of hernia Minimal RUQ/ epigastric abdominal tenderness;  much improved from 10/4. Skin:  Warm, dry; no sign of jaundice Psychiatric - alert and oriented x 4; calm mood and affect  Assessment/Plan Acute calculus cholecystitis - symptoms seem to be resolving.  Will obtain cardiac and neurologic clearance from Drs. Varanasi and Dohmeier.  Will need to hold anticoagulation prior to surgery.  Recommend laparoscopic cholecystectomy with intraoperative cholangiogram.  The surgical procedure has been discussed with the patient.  Potential risks, benefits, alternative treatments, and expected outcomes have been explained.  All of the patient's questions at this time have been answered.  The likelihood of reaching the patient's treatment goal is good.  The patient understand the proposed surgical procedure and wishes to proceed.   Selisa Tensley K., MD 10/15/2015, 9:24 AM   

## 2015-10-20 ENCOUNTER — Encounter: Payer: Self-pay | Admitting: Interventional Cardiology

## 2015-10-20 ENCOUNTER — Telehealth: Payer: Self-pay

## 2015-10-20 NOTE — Telephone Encounter (Signed)
Pt came to the office today requesting that Dr. Brett Fairy give him clearance for his gall bladder surgery. He says that his surgeon at [facility] told him that it was "up to me to get clearance from all my doctors". I told him that usually there is a form to fill out by Dr. Brett Fairy. Pt did not have a form but insisted that I do this right now and give him clearance. I advised him that I would call [facility] to find out specifically what they need from [place] and then I would call him. Pt verbalized understanding.  I called [facility] and spoke to triage nurse. She said that she will forward this request to the surgeon's RN and they will fax [place] the form to complete.

## 2015-10-21 ENCOUNTER — Ambulatory Visit (INDEPENDENT_AMBULATORY_CARE_PROVIDER_SITE_OTHER): Payer: Medicare Other | Admitting: Interventional Cardiology

## 2015-10-21 ENCOUNTER — Encounter: Payer: Self-pay | Admitting: Interventional Cardiology

## 2015-10-21 VITALS — BP 110/60 | HR 61 | Ht 64.0 in | Wt 182.0 lb

## 2015-10-21 DIAGNOSIS — I10 Essential (primary) hypertension: Secondary | ICD-10-CM | POA: Diagnosis not present

## 2015-10-21 DIAGNOSIS — I2510 Atherosclerotic heart disease of native coronary artery without angina pectoris: Secondary | ICD-10-CM

## 2015-10-21 DIAGNOSIS — E782 Mixed hyperlipidemia: Secondary | ICD-10-CM

## 2015-10-21 DIAGNOSIS — Z01810 Encounter for preprocedural cardiovascular examination: Secondary | ICD-10-CM

## 2015-10-21 DIAGNOSIS — Z794 Long term (current) use of insulin: Secondary | ICD-10-CM

## 2015-10-21 DIAGNOSIS — Z86711 Personal history of pulmonary embolism: Secondary | ICD-10-CM

## 2015-10-21 DIAGNOSIS — E118 Type 2 diabetes mellitus with unspecified complications: Secondary | ICD-10-CM

## 2015-10-21 NOTE — Telephone Encounter (Signed)
Patient returned Kristen's call °

## 2015-10-21 NOTE — Telephone Encounter (Signed)
Dr. Brett Fairy reviewed Dr. Vonna Kotyk notes and gave pt clearance for surgery neurologically. However, she asked that pt's cardiologist address pt's xarelto use. I faxed the letter signed by Dr. Brett Fairy to Dr. Vonna Kotyk office.  I called pt to advise him of this. No answer, left a message asking him to call me back.

## 2015-10-21 NOTE — Telephone Encounter (Signed)
Spoke to pt and advised him that Dr. Brett Fairy cleared him for surgery and wrote a letter to Dr. Georgette Dover to that effect. It was faxed to Dr. Vonna Kotyk office. Pt verbalized understanding.

## 2015-10-21 NOTE — Progress Notes (Signed)
Cardiology Office Note   Date:  10/21/2015   ID:  Steve Brown, DOB 1940-07-19, MRN [id number]  PCP:   Melinda Crutch, MD    No chief complaint on file. CAD   Wt Readings from Last 3 Encounters:  10/21/15 182 lb (82.6 kg)  10/13/15 182 lb (82.6 kg)  05/18/15 187 lb 12.8 oz (85.2 kg)       History of Present Illness: Steve Brown is a 75 y.o. male  who had an LAD stent in 2010. 2012 cath showed patent stent and no significant CAD. He had bilateral PE in 7/14. He has been on Xarelto ever since. He had a cath showing moderate disease which was negative by FFR in 2014.  No further CP.   He had neck surgery in 2004.No cardiac problems at that time.   Does silver sneakers and walks the track. He is limited by his back.  He is able to walk 2 miles at a time without CP or SHOB.  He walks up a flight of stairs regularly and has no cardiac issues with this.    Now, he has cholecystitis and will need surgery.  He is here for preoperative eval.   With the above exercise, he is keeping his DM better controlled.  He is eating healthier as well.   A1C decreased form 11 to 9.     Past Medical History:  Diagnosis Date  . Benign localized hyperplasia of prostate without urinary obstruction and other lower urinary tract symptoms (LUTS)   . Coronary artery disease   . Degeneration of cervical intervertebral disc   . Diabetes mellitus   . History of TIAs   . Hypersomnia with sleep apnea, unspecified   . Hypertension   . Lumbago   . Other and unspecified hyperlipidemia   . Pain in joint, multiple sites   . Personal history of unspecified circulatory disease   . Stroke [facility]) 2002, 2003   both sided weakness  . Trigger finger of both hands 11-17-13  . Type II or unspecified type diabetes mellitus without mention of complication, not stated as uncontrolled   . Unspecified cardiovascular disease   . Unspecified essential hypertension   . Unspecified fall   . Unsteady gait  05/19/2013   X 24 hours - improving. Hx prior CVA/TIAs; multiple risk factors; on Xarelto    Past Surgical History:  Procedure Laterality Date  . CARDIAC CATHETERIZATION    . CARPAL TUNNEL RELEASE     bilateral  . CERVICAL DISC SURGERY    . CORONARY ANGIOPLASTY    . CORONARY STENT PLACEMENT  Feb. 2010  . ELBOW SURGERY     right elbow - nerve release  . HERNIA REPAIR    . LEFT HEART CATHETERIZATION WITH CORONARY ANGIOGRAM N/A 04/03/2013   Procedure: LEFT HEART CATHETERIZATION WITH CORONARY ANGIOGRAM;  Surgeon: Jettie Booze, MD;  Location: [facility] CATH LAB;  Service: Cardiovascular;  Laterality: N/A;  . lungs     "fluid pumped off lungs"     Current Outpatient Prescriptions  Medication Sig Dispense Refill  . amoxicillin-clavulanate (AUGMENTIN XR) 1000-62.5 MG 12 hr tablet Take 2 tablets by mouth 2 (two) times daily. 28 tablet 0  . Ascorbic Acid (VITAMIN C PO) Take 1 tablet by mouth daily.    Marland Kitchen aspirin EC 81 MG tablet Take 1 tablet (81 mg total) by mouth daily.    . BD PEN NEEDLE NANO U/F 32G X 4 MM MISC by Does not apply route.     Marland Kitchen  chlorhexidine (PERIDEX) 0.12 % solution Use as directed 15 mLs in the mouth or throat 2 (two) times daily.     . cholecalciferol (VITAMIN D) 1000 UNITS tablet Take 1,000 Units by mouth daily.    . diclofenac sodium (VOLTAREN) 1 % GEL Apply 2 g topically 3 (three) times daily as needed (pain).    Marland Kitchen dutasteride (AVODART) 0.5 MG capsule Take 0.5 mg by mouth daily.    . ferrous sulfate 325 (65 FE) MG tablet Take 325 mg by mouth daily with breakfast.    . fluticasone (FLONASE) 50 MCG/ACT nasal spray Place 2 sprays into both nostrils daily as needed for allergies.     . furosemide (LASIX) 20 MG tablet Take 20 mg by mouth daily as needed (swelling).     Marland Kitchen HUMALOG MIX 75/25 KWIKPEN (75-25) 100 UNIT/ML Kwikpen Inject into the skin. 20 units in am, 18 units in pm.  1  . lactobacillus acidophilus & bulgar (LACTINEX) chewable tablet Chew 1 tablet by mouth 2 (two)  times daily.  0  . metFORMIN (GLUCOPHAGE) 500 MG tablet Take 500 mg by mouth 2 (two) times daily with a meal.    . Multiple Vitamin (MULTIVITAMIN WITH MINERALS) TABS Take 1 tablet by mouth every evening.     . nitroGLYCERIN (NITROSTAT) 0.4 MG SL tablet Place 1 tablet (0.4 mg total) under the tongue every 5 (five) minutes as needed for chest pain. 25 tablet 12  . nystatin (MYCOSTATIN) 100000 UNIT/ML suspension Take 5 mLs by mouth 2 (two) times daily.   3  . ondansetron (ZOFRAN ODT) 4 MG disintegrating tablet 4mg  ODT q4 hours prn nausea/vomit 20 tablet 0  . pantoprazole (PROTONIX) 40 MG tablet Take 40 mg by mouth daily.    . prednisoLONE acetate (PRED FORTE) 1 % ophthalmic suspension Place 1 drop into both eyes as needed.     . pregabalin (LYRICA) 300 MG capsule Take 1 capsule (300 mg total) by mouth 2 (two) times daily. 180 capsule 3  . ramipril (ALTACE) 2.5 MG capsule Take 2.5 mg by mouth daily.  1  . rivaroxaban (XARELTO) 20 MG TABS tablet Take 20 mg by mouth daily with supper.    . rosuvastatin (CRESTOR) 10 MG tablet Take 10 mg by mouth every evening.     . valACYclovir (VALTREX) 500 MG tablet Take 500 mg by mouth daily.     No current facility-administered medications for this visit.     Allergies:   Phenergan [promethazine]    Social History:  The patient  reports that he has never smoked. He has never used smokeless tobacco. He reports that he does not drink alcohol or use drugs.   Family History:  The patient's family history includes Aneurysm in his mother; Cancer in his father.    ROS:  Please see the history of present illness.   Otherwise, review of systems are positive for abdominal pain related to cholecystitis.  .   All other systems are reviewed and negative.    PHYSICAL EXAM: VS:  BP 110/60   Pulse 61   Ht 5\' 4"  (1.626 m)   Wt 182 lb (82.6 kg)   BMI 31.24 kg/m  , BMI Body mass index is 31.24 kg/m. GEN: Well nourished, well developed, in no acute distress  HEENT:  normal  Neck: no JVD, carotid bruits, or masses Cardiac: RRR; no murmurs, rubs, or gallops,no edema  Respiratory:  clear to auscultation bilaterally, normal work of breathing GI: soft, nontender, nondistended, + BS, obese  MS: no deformity or atrophy  Skin: warm and dry, no rash Neuro:  Strength and sensation are intact Psych: euthymic mood, full affect   EKG:   The ekg ordered today demonstrates NSR, non specific ST changes   Recent Labs: 10/13/2015: ALT 17; BUN 20; Creatinine, Ser 1.23; Hemoglobin 13.7; Platelets 183; Potassium 5.2; Sodium 135   Lipid Panel    Component Value Date/Time   CHOL 144 03/21/2013 0400   TRIG 122 03/21/2013 0400   HDL 57 03/21/2013 0400   CHOLHDL 2.5 03/21/2013 0400   VLDL 24 03/21/2013 0400   LDLCALC 63 03/21/2013 0400     Other studies Reviewed: Additional studies/ records that were reviewed today with results demonstrating: cath results reviewed.   ASSESSMENT AND PLAN:  1. CAD: No angina. Continue aggressive secondary prevention. 2. HTN: Blood pressure well controlled. Continue current medicines. 3. PE: Continue Xarelto. He is taking this indefinitely. He will have to stop this for at least a couple of days before his surgery. 4. Hyperlipidemia: Continue Crestor. Will obtain most recent lipid results from his primary care physician. 5. Preoperative eval: No further cardiac testing required prior to gallbladder surgery. 6. DM type 2:  I encouraged his lifestyle changes to help lower his blood sugar. He should try to exercise more and watch his diet carefully.   Current medicines are reviewed at length with the patient today.  The patient concerns regarding his medicines were addressed.  The following changes have been made:  No change  Labs/ tests ordered today include:  No orders of the defined types were placed in this encounter.   Recommend 150 minutes/week of aerobic exercise Low fat, low carb, high fiber diet  recommended  Disposition:   FU in one year   Signed, Larae Grooms, MD  10/21/2015 9:23 AM    [facility] [place], [place], [place]  [address] Phone: [phone]; Fax: [phone]

## 2015-10-21 NOTE — Patient Instructions (Signed)
**Note De-identified  Obfuscation** Medication Instructions:  Same-no changes  Labwork: None  Testing/Procedures: None  Follow-Up: Your physician wants you to follow-up in: 1 year. You will receive a reminder letter in the mail two months in advance. If you don't receive a letter, please call our office to schedule the follow-up appointment.      If you need a refill on your cardiac medications before your next appointment, please call your pharmacy.   

## 2015-10-26 DIAGNOSIS — E1042 Type 1 diabetes mellitus with diabetic polyneuropathy: Secondary | ICD-10-CM | POA: Diagnosis not present

## 2015-10-29 ENCOUNTER — Encounter (HOSPITAL_COMMUNITY)
Admission: RE | Admit: 2015-10-29 | Discharge: 2015-10-29 | Disposition: A | Discharge status: Home / Self Care | Payer: Medicare Other | Source: Ambulatory Visit | Attending: Surgery | Admitting: Surgery

## 2015-10-29 ENCOUNTER — Encounter (HOSPITAL_COMMUNITY): Payer: Self-pay

## 2015-10-29 DIAGNOSIS — K819 Cholecystitis, unspecified: Secondary | ICD-10-CM | POA: Diagnosis not present

## 2015-10-29 DIAGNOSIS — Z01812 Encounter for preprocedural laboratory examination: Secondary | ICD-10-CM | POA: Insufficient documentation

## 2015-10-29 HISTORY — DX: Depression, unspecified: F32.A

## 2015-10-29 HISTORY — DX: Major depressive disorder, single episode, unspecified: F32.9

## 2015-10-29 HISTORY — DX: Polyneuropathy, unspecified: G62.9

## 2015-10-29 HISTORY — DX: Gastro-esophageal reflux disease without esophagitis: K21.9

## 2015-10-29 LAB — BASIC METABOLIC PANEL
Anion gap: 5 (ref 5–15)
BUN: 12 mg/dL (ref 6–20)
CO2: 29 mmol/L (ref 22–32)
Calcium: 8.9 mg/dL (ref 8.9–10.3)
Chloride: 103 mmol/L (ref 101–111)
Creatinine, Ser: 1.11 mg/dL (ref 0.61–1.24)
GFR calc Af Amer: >60 mL/min (ref >60)
GFR calc non Af Amer: >60 mL/min (ref >60)
Glucose, Bld: 139 mg/dL — ABNORMAL HIGH (ref 65–99)
Potassium: 4.5 mmol/L (ref 3.5–5.1)
Sodium: 137 mmol/L (ref 135–145)

## 2015-10-29 LAB — GLUCOSE, CAPILLARY: Glucose-Capillary: 167 mg/dL — ABNORMAL HIGH (ref 65–99)

## 2015-10-29 LAB — CBC
HCT: 40 % (ref 39.0–52.0)
Hemoglobin: 13.4 g/dL (ref 13.0–17.0)
MCH: 30.2 pg (ref 26.0–34.0)
MCHC: 33.5 g/dL (ref 30.0–36.0)
MCV: 90.3 fL (ref 78.0–100.0)
Platelets: 246 10*3/uL (ref 150–400)
RBC: 4.43 MIL/uL (ref 4.22–5.81)
RDW: 12.8 % (ref 11.5–15.5)
WBC: 4.8 10*3/uL (ref 4.0–10.5)

## 2015-10-29 NOTE — Pre-Procedure Instructions (Signed)
Steve Brown  10/29/2015     Your procedure is scheduled on Tuesday, October 24.  Report to [facility] Admitting at 10:30 AM                  Your surgery or procedure is scheduled for 12:30 AM   Call this number if you have problems the morning of surgery:[phone]   Remember:  Do not eat food or drink liquids after midnight Monday, October 23.  Take these medicines the morning of surgery with A SIP OF WATER :pantoprazole (PROTONIX), pregabalin (LYRICA), valACYclovir (VALTREX).               DO NOT take metFORMIN (GLUCOPHAGE), or Insulin the morning of surgery.                 Stop Xarelto as instructed by Dr Georgette Dover.Marland Kitchen                            How to Manage Your Diabetes Before and After Surgery  WHAT DO I DO ABOUT MY DIABETES MEDICATION? Marland Kitchen Do not take oral diabetes medicines (pills) the morning of surgery.  . THE NIGHT BEFORE SURGERY, take _12  units of 75/25 insulin.      . THE MORNING OF SURGERY, take units of iinsulin. Why is it important to control my blood sugar before and after surgery? . Improving blood sugar levels before and after surgery helps healing and can limit problems. . A way of improving blood sugar control is eating a healthy diet by: o  Eating less sugar and carbohydrates o  Increasing activity/exercise o  Talking with your doctor about reaching your blood sugar goals . High blood sugars (greater than 180 mg/dL) can raise your risk of infections and slow your recovery, so you will need to focus on controlling your diabetes during the weeks before surgery. . Make sure that the doctor who takes care of your diabetes knows about your planned surgery including the date and location.  How do I manage my blood sugar before surgery? . Check your blood sugar at least 4 times a day, starting 2 days before surgery, to make sure that the level is not too high or low. o Check your blood sugar the morning of your surgery when you wake up and every 2  hours until you get to the Short Stay unit. . If your blood sugar is less than 70 mg/dL, you will need to treat for low blood sugar: o Do not take insulin. o Treat a low blood sugar (less than 70 mg/dL) with  cup of clear juice (cranberry or apple), 4 glucose tablets, OR glucose gel. o Recheck blood sugar in 15 minutes after treatment (to make sure it is greater than 70 mg/dL). If your blood sugar is not greater than 70 mg/dL on recheck, call [phone] for further instructions. . Report your blood sugar to the short stay nurse when you get to Short Stay.  . If you are admitted to the hospital after surgery: o Your blood sugar will be checked by the staff and you will probably be given insulin after surgery (instead of oral diabetes medicines) to make sure you have good blood sugar levels. o The goal for blood sugar control after surgery is 80-180 mg/dL.  Patient Signature:  Date:   Nurse Signature:  Date:    Do not wear jewelry, make-up or nail polish.  Do  not wear lotions, powders, or perfumes, or deodorant.  Men may shave face and neck.  Do not bring valuables to the hospital.  [facility] is not responsible for any belongings or valuables.  Contacts, dentures or bridgework may not be worn into surgery.  Leave your suitcase in the car.  After surgery it may be brought to your room.  For patients admitted to the hospital, discharge time will be determined by your treatment team.  Patients discharged the day of surgery will not be allowed to drive home.   Special instructions:  Review  [facility] - Preparing For Surgery.  Please read over the following fact sheets that you were given: [facility]- Preparing For Surgery and Patient Instructions for Mupirocin Application, Coughing and Deep Breathing, Pain Booklet

## 2015-10-29 NOTE — Progress Notes (Signed)
Mr Steve Brown reports that CBGs run in 120- 150 range, "last A1C was 9 approximately 6 months ago.". Patients endocrinologist is Dr Suzette Battiest

## 2015-10-30 LAB — HEMOGLOBIN A1C
Hgb A1c MFr Bld: 7.8 % — ABNORMAL HIGH (ref 4.8–5.6)
Mean Plasma Glucose: 177 mg/dL

## 2015-11-02 ENCOUNTER — Ambulatory Visit (HOSPITAL_COMMUNITY): Payer: Medicare Other | Admitting: Certified Registered Nurse Anesthetist

## 2015-11-02 ENCOUNTER — Observation Stay (HOSPITAL_COMMUNITY)
Admission: RE | Admit: 2015-11-02 | Discharge: 2015-11-03 | Disposition: A | Discharge status: Home / Self Care | Payer: Medicare Other | Source: Ambulatory Visit | Attending: Surgery | Admitting: Surgery

## 2015-11-02 ENCOUNTER — Encounter (HOSPITAL_COMMUNITY): Payer: Self-pay | Admitting: *Deleted

## 2015-11-02 ENCOUNTER — Ambulatory Visit (HOSPITAL_COMMUNITY): Payer: Medicare Other

## 2015-11-02 ENCOUNTER — Encounter (HOSPITAL_COMMUNITY)
Admission: RE | Disposition: A | Discharge status: Home / Self Care | Payer: Self-pay | Source: Ambulatory Visit | Attending: Surgery

## 2015-11-02 DIAGNOSIS — I2510 Atherosclerotic heart disease of native coronary artery without angina pectoris: Secondary | ICD-10-CM | POA: Diagnosis not present

## 2015-11-02 DIAGNOSIS — R2681 Unsteadiness on feet: Secondary | ICD-10-CM | POA: Insufficient documentation

## 2015-11-02 DIAGNOSIS — M545 Low back pain: Secondary | ICD-10-CM | POA: Insufficient documentation

## 2015-11-02 DIAGNOSIS — E119 Type 2 diabetes mellitus without complications: Secondary | ICD-10-CM | POA: Insufficient documentation

## 2015-11-02 DIAGNOSIS — G4710 Hypersomnia, unspecified: Secondary | ICD-10-CM | POA: Insufficient documentation

## 2015-11-02 DIAGNOSIS — Z7901 Long term (current) use of anticoagulants: Secondary | ICD-10-CM | POA: Insufficient documentation

## 2015-11-02 DIAGNOSIS — Z79899 Other long term (current) drug therapy: Secondary | ICD-10-CM | POA: Insufficient documentation

## 2015-11-02 DIAGNOSIS — I10 Essential (primary) hypertension: Secondary | ICD-10-CM | POA: Insufficient documentation

## 2015-11-02 DIAGNOSIS — K5900 Constipation, unspecified: Secondary | ICD-10-CM | POA: Insufficient documentation

## 2015-11-02 DIAGNOSIS — E785 Hyperlipidemia, unspecified: Secondary | ICD-10-CM | POA: Diagnosis not present

## 2015-11-02 DIAGNOSIS — Z7982 Long term (current) use of aspirin: Secondary | ICD-10-CM | POA: Diagnosis not present

## 2015-11-02 DIAGNOSIS — N400 Enlarged prostate without lower urinary tract symptoms: Secondary | ICD-10-CM | POA: Insufficient documentation

## 2015-11-02 DIAGNOSIS — Z794 Long term (current) use of insulin: Secondary | ICD-10-CM | POA: Diagnosis not present

## 2015-11-02 DIAGNOSIS — M5030 Other cervical disc degeneration, unspecified cervical region: Secondary | ICD-10-CM | POA: Diagnosis not present

## 2015-11-02 DIAGNOSIS — D1803 Hemangioma of intra-abdominal structures: Secondary | ICD-10-CM | POA: Diagnosis not present

## 2015-11-02 DIAGNOSIS — K8010 Calculus of gallbladder with chronic cholecystitis without obstruction: Secondary | ICD-10-CM | POA: Diagnosis not present

## 2015-11-02 DIAGNOSIS — Z8249 Family history of ischemic heart disease and other diseases of the circulatory system: Secondary | ICD-10-CM | POA: Diagnosis not present

## 2015-11-02 DIAGNOSIS — G4730 Sleep apnea, unspecified: Secondary | ICD-10-CM | POA: Insufficient documentation

## 2015-11-02 DIAGNOSIS — I700 Atherosclerosis of aorta: Secondary | ICD-10-CM | POA: Diagnosis not present

## 2015-11-02 DIAGNOSIS — Z955 Presence of coronary angioplasty implant and graft: Secondary | ICD-10-CM | POA: Insufficient documentation

## 2015-11-02 DIAGNOSIS — Z8673 Personal history of transient ischemic attack (TIA), and cerebral infarction without residual deficits: Secondary | ICD-10-CM | POA: Diagnosis not present

## 2015-11-02 DIAGNOSIS — Z809 Family history of malignant neoplasm, unspecified: Secondary | ICD-10-CM | POA: Diagnosis not present

## 2015-11-02 DIAGNOSIS — Q6102 Congenital multiple renal cysts: Secondary | ICD-10-CM | POA: Insufficient documentation

## 2015-11-02 DIAGNOSIS — Z888 Allergy status to other drugs, medicaments and biological substances status: Secondary | ICD-10-CM | POA: Insufficient documentation

## 2015-11-02 DIAGNOSIS — K828 Other specified diseases of gallbladder: Secondary | ICD-10-CM | POA: Diagnosis not present

## 2015-11-02 DIAGNOSIS — Z6830 Body mass index (BMI) 30.0-30.9, adult: Secondary | ICD-10-CM | POA: Insufficient documentation

## 2015-11-02 DIAGNOSIS — K7689 Other specified diseases of liver: Secondary | ICD-10-CM | POA: Diagnosis not present

## 2015-11-02 HISTORY — PX: CHOLECYSTECTOMY: SHX55

## 2015-11-02 LAB — GLUCOSE, CAPILLARY
Glucose-Capillary: 132 mg/dL — ABNORMAL HIGH (ref 65–99)
Glucose-Capillary: 179 mg/dL — ABNORMAL HIGH (ref 65–99)
Glucose-Capillary: 159 mg/dL — ABNORMAL HIGH (ref 65–99)
Glucose-Capillary: 243 mg/dL — ABNORMAL HIGH (ref 65–99)

## 2015-11-02 SURGERY — LAPAROSCOPIC CHOLECYSTECTOMY WITH INTRAOPERATIVE CHOLANGIOGRAM
Anesthesia: General | Site: Abdomen

## 2015-11-02 MED ORDER — FERROUS SULFATE 325 (65 FE) MG PO TABS
325.0000 mg | ORAL_TABLET | Freq: Every day | ORAL | Status: DC
  Administered 2015-11-03: 325 mg via ORAL
  Filled 2015-11-02: qty 1

## 2015-11-02 MED ORDER — ONDANSETRON HCL 4 MG/2ML IJ SOLN
INTRAMUSCULAR | Status: DC | PRN
  Administered 2015-11-02: 4 mg via INTRAVENOUS

## 2015-11-02 MED ORDER — MORPHINE SULFATE (PF) 2 MG/ML IV SOLN
2.0000 mg | INTRAVENOUS | Status: DC | PRN
  Administered 2015-11-02: 2 mg via INTRAVENOUS
  Filled 2015-11-02: qty 1

## 2015-11-02 MED ORDER — 0.9 % SODIUM CHLORIDE (POUR BTL) OPTIME
TOPICAL | Status: DC | PRN
  Administered 2015-11-02: 1000 mL

## 2015-11-02 MED ORDER — DIPHENHYDRAMINE HCL 50 MG/ML IJ SOLN: 12.5000 mg | Freq: Four times a day (QID) | INTRAMUSCULAR | Status: DC | PRN

## 2015-11-02 MED ORDER — ROSUVASTATIN CALCIUM 10 MG PO TABS
10.0000 mg | ORAL_TABLET | Freq: Every evening | ORAL | Status: DC
  Administered 2015-11-02: 10 mg via ORAL
  Filled 2015-11-02 (×2): qty 1

## 2015-11-02 MED ORDER — FENTANYL CITRATE (PF) 100 MCG/2ML IJ SOLN
INTRAMUSCULAR | Status: AC
  Filled 2015-11-02: qty 4

## 2015-11-02 MED ORDER — METFORMIN HCL 500 MG PO TABS
500.0000 mg | ORAL_TABLET | Freq: Two times a day (BID) | ORAL | Status: DC
  Administered 2015-11-02 – 2015-11-03 (×2): 500 mg via ORAL
  Filled 2015-11-02 (×2): qty 1

## 2015-11-02 MED ORDER — CEFAZOLIN SODIUM-DEXTROSE 2-4 GM/100ML-% IV SOLN
INTRAVENOUS | Status: AC
  Filled 2015-11-02: qty 100

## 2015-11-02 MED ORDER — ESMOLOL HCL 100 MG/10ML IV SOLN
INTRAVENOUS | Status: DC | PRN
  Administered 2015-11-02: 30 mg via INTRAVENOUS

## 2015-11-02 MED ORDER — BUPIVACAINE-EPINEPHRINE 0.25% -1:200000 IJ SOLN
INTRAMUSCULAR | Status: DC | PRN
  Administered 2015-11-02: 10 mL

## 2015-11-02 MED ORDER — IOPAMIDOL (ISOVUE-300) INJECTION 61%
INTRAVENOUS | Status: AC
  Filled 2015-11-02: qty 50

## 2015-11-02 MED ORDER — DIPHENHYDRAMINE HCL 12.5 MG/5ML PO ELIX: 12.5000 mg | ORAL_SOLUTION | Freq: Four times a day (QID) | ORAL | Status: DC | PRN

## 2015-11-02 MED ORDER — FENTANYL CITRATE (PF) 100 MCG/2ML IJ SOLN
INTRAMUSCULAR | Status: DC | PRN
  Administered 2015-11-02: 100 ug via INTRAVENOUS
  Administered 2015-11-02 (×2): 50 ug via INTRAVENOUS

## 2015-11-02 MED ORDER — PHENYLEPHRINE 40 MCG/ML (10ML) SYRINGE FOR IV PUSH (FOR BLOOD PRESSURE SUPPORT)
PREFILLED_SYRINGE | INTRAVENOUS | Status: AC
  Filled 2015-11-02: qty 30

## 2015-11-02 MED ORDER — INSULIN ASPART 100 UNIT/ML ~~LOC~~ SOLN
0.0000 [IU] | Freq: Every day | SUBCUTANEOUS | Status: DC
  Administered 2015-11-02: 2 [IU] via SUBCUTANEOUS

## 2015-11-02 MED ORDER — PROPOFOL 10 MG/ML IV BOLUS
INTRAVENOUS | Status: AC
  Filled 2015-11-02: qty 20

## 2015-11-02 MED ORDER — INSULIN ASPART 100 UNIT/ML ~~LOC~~ SOLN
0.0000 [IU] | Freq: Three times a day (TID) | SUBCUTANEOUS | Status: DC
  Administered 2015-11-03 (×2): 11 [IU] via SUBCUTANEOUS

## 2015-11-02 MED ORDER — DUTASTERIDE 0.5 MG PO CAPS
0.5000 mg | ORAL_CAPSULE | Freq: Every day | ORAL | Status: DC
  Administered 2015-11-02 – 2015-11-03 (×2): 0.5 mg via ORAL
  Filled 2015-11-02 (×2): qty 1

## 2015-11-02 MED ORDER — SUGAMMADEX SODIUM 500 MG/5ML IV SOLN
INTRAVENOUS | Status: DC | PRN
  Administered 2015-11-02: 180 mg via INTRAVENOUS

## 2015-11-02 MED ORDER — LABETALOL HCL 5 MG/ML IV SOLN
INTRAVENOUS | Status: AC
  Filled 2015-11-02: qty 4

## 2015-11-02 MED ORDER — ROCURONIUM BROMIDE 10 MG/ML (PF) SYRINGE
PREFILLED_SYRINGE | INTRAVENOUS | Status: AC
  Filled 2015-11-02: qty 20

## 2015-11-02 MED ORDER — CEFAZOLIN SODIUM-DEXTROSE 2-3 GM-% IV SOLR
INTRAVENOUS | Status: DC | PRN
  Administered 2015-11-02: 2 g via INTRAVENOUS

## 2015-11-02 MED ORDER — NITROGLYCERIN 0.4 MG SL SUBL: 0.4000 mg | SUBLINGUAL_TABLET | SUBLINGUAL | Status: DC | PRN

## 2015-11-02 MED ORDER — RAMIPRIL 2.5 MG PO CAPS
2.5000 mg | ORAL_CAPSULE | Freq: Every day | ORAL | Status: DC
  Administered 2015-11-02: 2.5 mg via ORAL
  Filled 2015-11-02 (×3): qty 1

## 2015-11-02 MED ORDER — PHENYLEPHRINE HCL 10 MG/ML IJ SOLN
INTRAMUSCULAR | Status: DC | PRN
  Administered 2015-11-02: 40 ug via INTRAVENOUS
  Administered 2015-11-02: 120 ug via INTRAVENOUS

## 2015-11-02 MED ORDER — PANTOPRAZOLE SODIUM 40 MG PO TBEC
40.0000 mg | DELAYED_RELEASE_TABLET | Freq: Every day | ORAL | Status: DC
  Administered 2015-11-03: 40 mg via ORAL
  Filled 2015-11-02: qty 1

## 2015-11-02 MED ORDER — CEFAZOLIN SODIUM-DEXTROSE 2-4 GM/100ML-% IV SOLN
2.0000 g | Freq: Three times a day (TID) | INTRAVENOUS | Status: AC
  Administered 2015-11-02: 2 g via INTRAVENOUS
  Filled 2015-11-02: qty 100

## 2015-11-02 MED ORDER — MIDAZOLAM HCL 2 MG/2ML IJ SOLN: 0.5000 mg | Freq: Once | INTRAMUSCULAR | Status: DC | PRN

## 2015-11-02 MED ORDER — METHOCARBAMOL 500 MG PO TABS: 500.0000 mg | ORAL_TABLET | Freq: Four times a day (QID) | ORAL | Status: DC | PRN

## 2015-11-02 MED ORDER — IOPAMIDOL (ISOVUE-300) INJECTION 61%
INTRAVENOUS | Status: DC | PRN
  Administered 2015-11-02: 5 mL

## 2015-11-02 MED ORDER — FUROSEMIDE 20 MG PO TABS: 20.0000 mg | ORAL_TABLET | Freq: Every day | ORAL | Status: DC | PRN

## 2015-11-02 MED ORDER — ESMOLOL HCL 100 MG/10ML IV SOLN
INTRAVENOUS | Status: AC
  Filled 2015-11-02: qty 10

## 2015-11-02 MED ORDER — PROMETHAZINE HCL 25 MG/ML IJ SOLN: 6.2500 mg | INTRAMUSCULAR | Status: DC | PRN

## 2015-11-02 MED ORDER — SUGAMMADEX SODIUM 500 MG/5ML IV SOLN
INTRAVENOUS | Status: AC
  Filled 2015-11-02: qty 5

## 2015-11-02 MED ORDER — LIDOCAINE HCL (CARDIAC) 20 MG/ML IV SOLN
INTRAVENOUS | Status: DC | PRN
  Administered 2015-11-02: 20 mg via INTRAVENOUS

## 2015-11-02 MED ORDER — EPHEDRINE SULFATE 50 MG/ML IJ SOLN
INTRAMUSCULAR | Status: DC | PRN
  Administered 2015-11-02 (×2): 10 mg via INTRAVENOUS

## 2015-11-02 MED ORDER — HYDROMORPHONE HCL 2 MG/ML IJ SOLN
INTRAMUSCULAR | Status: AC
  Administered 2015-11-02: 0.25 mg
  Filled 2015-11-02: qty 1

## 2015-11-02 MED ORDER — SODIUM CHLORIDE 0.9 % IR SOLN
Status: DC | PRN
  Administered 2015-11-02: 1000 mL

## 2015-11-02 MED ORDER — SENNOSIDES-DOCUSATE SODIUM 8.6-50 MG PO TABS: 1.0000 | ORAL_TABLET | Freq: Every evening | ORAL | Status: DC | PRN

## 2015-11-02 MED ORDER — MEPERIDINE HCL 25 MG/ML IJ SOLN: 6.2500 mg | INTRAMUSCULAR | Status: DC | PRN

## 2015-11-02 MED ORDER — LIDOCAINE 2% (20 MG/ML) 5 ML SYRINGE
INTRAMUSCULAR | Status: AC
  Filled 2015-11-02: qty 15

## 2015-11-02 MED ORDER — BUPIVACAINE-EPINEPHRINE (PF) 0.5% -1:200000 IJ SOLN
INTRAMUSCULAR | Status: AC
  Filled 2015-11-02: qty 30

## 2015-11-02 MED ORDER — HYDROMORPHONE HCL 1 MG/ML IJ SOLN
0.2500 mg | INTRAMUSCULAR | Status: DC | PRN
  Administered 2015-11-02 (×4): 0.5 mg via INTRAVENOUS

## 2015-11-02 MED ORDER — ONDANSETRON 4 MG PO TBDP: 4.0000 mg | ORAL_TABLET | Freq: Four times a day (QID) | ORAL | Status: DC | PRN

## 2015-11-02 MED ORDER — KETOROLAC TROMETHAMINE 15 MG/ML IJ SOLN
15.0000 mg | Freq: Four times a day (QID) | INTRAMUSCULAR | Status: DC
  Administered 2015-11-02 – 2015-11-03 (×3): 15 mg via INTRAVENOUS
  Filled 2015-11-02 (×3): qty 1

## 2015-11-02 MED ORDER — HEMOSTATIC AGENTS (NO CHARGE) OPTIME
TOPICAL | Status: DC | PRN
  Administered 2015-11-02: 1 via TOPICAL

## 2015-11-02 MED ORDER — LACTATED RINGERS IV SOLN
INTRAVENOUS | Status: DC
  Administered 2015-11-02 – 2015-11-03 (×3): via INTRAVENOUS

## 2015-11-02 MED ORDER — PROPOFOL 10 MG/ML IV BOLUS
INTRAVENOUS | Status: DC | PRN
Start: 2015-11-02 — End: 2015-11-02
  Administered 2015-11-02: 150 mg via INTRAVENOUS

## 2015-11-02 MED ORDER — LABETALOL HCL 5 MG/ML IV SOLN
INTRAVENOUS | Status: DC | PRN
  Administered 2015-11-02: 10 mg via INTRAVENOUS

## 2015-11-02 MED ORDER — ROCURONIUM BROMIDE 100 MG/10ML IV SOLN
INTRAVENOUS | Status: DC | PRN
  Administered 2015-11-02: 20 mg via INTRAVENOUS
  Administered 2015-11-02: 50 mg via INTRAVENOUS

## 2015-11-02 MED ORDER — ENOXAPARIN SODIUM 30 MG/0.3ML ~~LOC~~ SOLN
30.0000 mg | SUBCUTANEOUS | Status: DC
  Administered 2015-11-03: 30 mg via SUBCUTANEOUS
  Filled 2015-11-02: qty 0.3

## 2015-11-02 MED ORDER — ONDANSETRON HCL 4 MG/2ML IJ SOLN: 4.0000 mg | Freq: Four times a day (QID) | INTRAMUSCULAR | Status: DC | PRN

## 2015-11-02 MED ORDER — OXYCODONE-ACETAMINOPHEN 5-325 MG PO TABS: 1.0000 | ORAL_TABLET | ORAL | Status: DC | PRN

## 2015-11-02 MED ORDER — EPHEDRINE 5 MG/ML INJ
INTRAVENOUS | Status: AC
  Filled 2015-11-02: qty 10

## 2015-11-02 MED ORDER — PREGABALIN 75 MG PO CAPS
300.0000 mg | ORAL_CAPSULE | Freq: Two times a day (BID) | ORAL | Status: DC
  Administered 2015-11-02 – 2015-11-03 (×2): 300 mg via ORAL
  Filled 2015-11-02 (×2): qty 4

## 2015-11-02 MED ORDER — ONDANSETRON HCL 4 MG/2ML IJ SOLN
INTRAMUSCULAR | Status: AC
  Filled 2015-11-02: qty 4

## 2015-11-02 MED ORDER — SUCCINYLCHOLINE CHLORIDE 200 MG/10ML IV SOSY
PREFILLED_SYRINGE | INTRAVENOUS | Status: AC
  Filled 2015-11-02: qty 20

## 2015-11-02 SURGICAL SUPPLY — 51 items
APL SKNCLS STERI-STRIP NONHPOA (GAUZE/BANDAGES/DRESSINGS) ×1
APPLIER CLIP ROT 10 11.4 M/L (STAPLE) ×2
APR CLP MED LRG 11.4X10 (STAPLE) ×1
BAG SPEC RTRVL LRG 6X4 10 (ENDOMECHANICALS) ×1
BENZOIN TINCTURE PRP APPL 2/3 (GAUZE/BANDAGES/DRESSINGS) ×2 IMPLANT
BLADE SURG ROTATE 9660 (MISCELLANEOUS) IMPLANT
CANISTER SUCTION 2500CC (MISCELLANEOUS) ×2 IMPLANT
CHLORAPREP W/TINT 26ML (MISCELLANEOUS) ×2 IMPLANT
CLIP APPLIE ROT 10 11.4 M/L (STAPLE) ×1 IMPLANT
COVER MAYO STAND STRL (DRAPES) ×2 IMPLANT
COVER SURGICAL LIGHT HANDLE (MISCELLANEOUS) ×2 IMPLANT
DRAPE C-ARM 42X72 X-RAY (DRAPES) ×2 IMPLANT
DRSG TEGADERM 2-3/8X2-3/4 SM (GAUZE/BANDAGES/DRESSINGS) ×6 IMPLANT
DRSG TEGADERM 4X4.75 (GAUZE/BANDAGES/DRESSINGS) ×2 IMPLANT
ELECT REM PT RETURN 9FT ADLT (ELECTROSURGICAL) ×2
ELECTRODE REM PT RTRN 9FT ADLT (ELECTROSURGICAL) ×1 IMPLANT
FILTER SMOKE EVAC LAPAROSHD (FILTER) ×2 IMPLANT
GAUZE SPONGE 2X2 8PLY STRL LF (GAUZE/BANDAGES/DRESSINGS) ×1 IMPLANT
GLOVE BIO SURGEON STRL SZ7 (GLOVE) ×2 IMPLANT
GLOVE BIO SURGEON STRL SZ8 (GLOVE) ×1 IMPLANT
GLOVE BIOGEL PI IND STRL 6.5 (GLOVE) IMPLANT
GLOVE BIOGEL PI IND STRL 7.0 (GLOVE) IMPLANT
GLOVE BIOGEL PI IND STRL 7.5 (GLOVE) ×1 IMPLANT
GLOVE BIOGEL PI INDICATOR 6.5 (GLOVE) ×1
GLOVE BIOGEL PI INDICATOR 7.0 (GLOVE) ×1
GLOVE BIOGEL PI INDICATOR 7.5 (GLOVE) ×1
GLOVE ECLIPSE 7.0 STRL STRAW (GLOVE) ×1 IMPLANT
GLOVE SURG SS PI 6.0 STRL IVOR (GLOVE) ×1 IMPLANT
GOWN STRL REUS W/ TWL LRG LVL3 (GOWN DISPOSABLE) ×3 IMPLANT
GOWN STRL REUS W/TWL LRG LVL3 (GOWN DISPOSABLE) ×6
HEMOSTAT SNOW SURGICEL 2X4 (HEMOSTASIS) ×1 IMPLANT
KIT BASIN OR (CUSTOM PROCEDURE TRAY) ×2 IMPLANT
KIT ROOM TURNOVER OR (KITS) ×2 IMPLANT
NS IRRIG 1000ML POUR BTL (IV SOLUTION) ×2 IMPLANT
PAD ARMBOARD 7.5X6 YLW CONV (MISCELLANEOUS) ×2 IMPLANT
POUCH SPECIMEN RETRIEVAL 10MM (ENDOMECHANICALS) ×2 IMPLANT
SCISSORS LAP 5X35 DISP (ENDOMECHANICALS) ×2 IMPLANT
SET CHOLANGIOGRAPH 5 50 .035 (SET/KITS/TRAYS/PACK) ×2 IMPLANT
SET IRRIG TUBING LAPAROSCOPIC (IRRIGATION / IRRIGATOR) ×2 IMPLANT
SLEEVE ENDOPATH XCEL 5M (ENDOMECHANICALS) ×2 IMPLANT
SPECIMEN JAR SMALL (MISCELLANEOUS) ×2 IMPLANT
SPONGE GAUZE 2X2 STER 10/PKG (GAUZE/BANDAGES/DRESSINGS) ×1
STRIP CLOSURE SKIN 1/2X4 (GAUZE/BANDAGES/DRESSINGS) ×2 IMPLANT
SUT MNCRL AB 4-0 PS2 18 (SUTURE) ×2 IMPLANT
TOWEL OR 17X24 6PK STRL BLUE (TOWEL DISPOSABLE) ×2 IMPLANT
TOWEL OR 17X26 10 PK STRL BLUE (TOWEL DISPOSABLE) ×2 IMPLANT
TRAY LAPAROSCOPIC MC (CUSTOM PROCEDURE TRAY) ×2 IMPLANT
TROCAR XCEL BLUNT TIP 100MML (ENDOMECHANICALS) ×2 IMPLANT
TROCAR XCEL NON-BLD 11X100MML (ENDOMECHANICALS) ×2 IMPLANT
TROCAR XCEL NON-BLD 5MMX100MML (ENDOMECHANICALS) ×2 IMPLANT
TUBING INSUFFLATION (TUBING) ×2 IMPLANT

## 2015-11-02 NOTE — Interval H&P Note (Signed)
History and Physical Interval Note:  11/02/2015 8:56 AM  Steve Brown  has presented today for surgery, with the diagnosis of CHRONIC CALCULUS CHOLECYSTITIS  The various methods of treatment have been discussed with the patient and family. After consideration of risks, benefits and other options for treatment, the patient has consented to  Procedure(s): LAPAROSCOPIC CHOLECYSTECTOMY WITH INTRAOPERATIVE CHOLANGIOGRAM (N/A) as a surgical intervention .  The patient's history has been reviewed, patient examined, no change in status, stable for surgery.  I have reviewed the patient's chart and labs.  Questions were answered to the patient's satisfaction.     Carlena Ruybal K.

## 2015-11-02 NOTE — Anesthesia Procedure Notes (Addendum)
Procedure Name: Intubation Date/Time: 11/02/2015 11:07 AM Performed by: Oletta Lamas Pre-anesthesia Checklist: Patient identified, Emergency Drugs available, Suction available and Patient being monitored Patient Re-evaluated:Patient Re-evaluated prior to inductionOxygen Delivery Method: Circle System Utilized Preoxygenation: Pre-oxygenation with 100% oxygen Intubation Type: IV induction Ventilation: Mask ventilation without difficulty Laryngoscope Size: Mac and 3 Grade View: Grade I Tube type: Oral Tube size: 7.5 mm Number of attempts: 1 Airway Equipment and Method: Stylet Placement Confirmation: ETT inserted through vocal cords under direct vision,  positive ETCO2 and breath sounds checked- equal and bilateral Secured at: 23 cm Tube secured with: Tape Dental Injury: Teeth and Oropharynx as per pre-operative assessment

## 2015-11-02 NOTE — Progress Notes (Signed)
Pt's CBG at bedtime was 243. Dr. Georgette Dover paged and notified of pt's condition and ordered Glycemic Index (SSI) order set. Will continue to monitor pt.Marland Kitchen

## 2015-11-02 NOTE — Anesthesia Postprocedure Evaluation (Signed)
Anesthesia Post Note  Patient: Steve Brown  Procedure(s) Performed: Procedure(s) (LRB): LAPAROSCOPIC CHOLECYSTECTOMY WITH INTRAOPERATIVE CHOLANGIOGRAM (N/A)  Patient location during evaluation: PACU Anesthesia Type: General Level of consciousness: awake and alert, oriented and patient cooperative Pain management: pain level controlled Vital Signs Assessment: post-procedure vital signs reviewed and stable Respiratory status: spontaneous breathing, nonlabored ventilation, respiratory function stable and patient connected to nasal cannula oxygen Cardiovascular status: blood pressure returned to baseline and stable Postop Assessment: no signs of nausea or vomiting Anesthetic complications: no    Last Vitals:  Vitals:   11/02/15 1337 11/02/15 1428  BP: (!) 143/68 (!) 168/82  Pulse: 60 66  Resp: 13 17  Temp: 36.4 C 36.3 C    Last Pain:  Vitals:   11/02/15 1428  TempSrc: Oral  PainSc:                  Mabeline Varas,E. Loveta Dellis

## 2015-11-02 NOTE — Anesthesia Preprocedure Evaluation (Addendum)
Anesthesia Evaluation  Patient identified by MRN, date of birth, ID band Patient awake    Reviewed: Allergy & Precautions, NPO status , Patient's Chart, lab work & pertinent test results  History of Anesthesia Complications Negative for: history of anesthetic complications  Airway Mallampati: I  TM Distance: >3 FB Neck ROM: Full    Dental  (+) Dental Advisory Given   Pulmonary sleep apnea and Continuous Positive Airway Pressure Ventilation , PE   breath sounds clear to auscultation       Cardiovascular hypertension, Pt. on medications (-) angina+ CAD and + Cardiac Stents   Rhythm:Regular Rate:Normal  '15 ECHO: EF 60-65%, valves OK '12 cath: patent LAD stent, no significant CAD   Neuro/Psych Anxiety Depression TIACVA (maybe global weakness, walks ok)    GI/Hepatic Neg liver ROS, GERD  Medicated and Controlled,  Endo/Other  diabetes (glu 132), Insulin Dependent, Oral Hypoglycemic AgentsMorbid obesity  Renal/GU negative Renal ROS     Musculoskeletal   Abdominal (+) + obese,   Peds  Hematology negative hematology ROS (+)   Anesthesia Other Findings   Reproductive/Obstetrics                            Anesthesia Physical Anesthesia Plan  ASA: III  Anesthesia Plan: General   Post-op Pain Management:    Induction: Intravenous  Airway Management Planned: Oral ETT  Additional Equipment:   Intra-op Plan:   Post-operative Plan: Extubation in OR  Informed Consent: I have reviewed the patients History and Physical, chart, labs and discussed the procedure including the risks, benefits and alternatives for the proposed anesthesia with the patient or authorized representative who has indicated his/her understanding and acceptance.   Dental advisory given  Plan Discussed with: CRNA and Surgeon  Anesthesia Plan Comments: (Plan routine monitors, GETA)        Anesthesia Quick  Evaluation

## 2015-11-02 NOTE — Op Note (Signed)
Laparoscopic Cholecystectomy with IOC Procedure Note  Indications: This patient presents with symptomatic gallbladder disease and will undergo laparoscopic cholecystectomy.  Pre-operative Diagnosis: Calculus of gallbladder with other cholecystitis, without mention of obstruction  Post-operative Diagnosis: Same  Surgeon: Sanyla Summey K.   Assistants: Judyann Munson, RNFA  Anesthesia: General endotracheal anesthesia  ASA Class: 2  Procedure Details  The patient was seen again in the Holding Room. The risks, benefits, complications, treatment options, and expected outcomes were discussed with the patient. The possibilities of reaction to medication, pulmonary aspiration, perforation of viscus, bleeding, recurrent infection, finding a normal gallbladder, the need for additional procedures, failure to diagnose a condition, the possible need to convert to an open procedure, and creating a complication requiring transfusion or operation were discussed with the patient. The likelihood of improving the patient's symptoms with return to their baseline status is good.  The patient and/or family concurred with the proposed plan, giving informed consent. The site of surgery properly noted. The patient was taken to Operating Room, identified as Steve Brown and the procedure verified as Laparoscopic Cholecystectomy with Intraoperative Cholangiogram. A Time Out was held and the above information confirmed.  Prior to the induction of general anesthesia, antibiotic prophylaxis was administered. General endotracheal anesthesia was then administered and tolerated well. After the induction, the abdomen was prepped with Chloraprep and draped in the sterile fashion. The patient was positioned in the supine position.  Local anesthetic agent was injected into the skin near the umbilicus and an incision made. We dissected down to the abdominal fascia with blunt dissection.  The fascia was incised vertically and  we entered the peritoneal cavity bluntly.  A pursestring suture of 0-Vicryl was placed around the fascial opening.  The Hasson cannula was inserted and secured with the stay suture.  Pneumoperitoneum was then created with CO2 and tolerated well without any adverse changes in the patient's vital signs. An 11-mm port was placed in the subxiphoid position.  Two 5-mm ports were placed in the right upper quadrant. All skin incisions were infiltrated with a local anesthetic agent before making the incision and placing the trocars.   We positioned the patient in reverse Trendelenburg, tilted slightly to the patient's left.  The gallbladder was identified, the fundus grasped and retracted cephalad. There were some omental adhesions to the surface of the gallbladder.  Adhesions were lysed bluntly and with the electrocautery where indicated, taking care not to injure any adjacent organs or viscus. The infundibulum was grasped and retracted laterally, exposing the peritoneum overlying the triangle of Calot. This was then divided and exposed in a blunt fashion. A critical view of the cystic duct and cystic artery was obtained.  The cystic duct was clearly identified and bluntly dissected circumferentially. The cystic duct was ligated with a clip distally.   An incision was made in the cystic duct and the [facility] cholangiogram catheter introduced. The catheter was secured using a clip. A cholangiogram was then obtained which showed good visualization of the distal and proximal biliary tree with no sign of filling defects or obstruction.  Contrast flowed easily into the duodenum. The catheter was then removed.   The cystic duct was then ligated with clips and divided. The cystic artery was identified, dissected free, ligated with clips and divided as well.   The gallbladder was dissected from the liver bed in retrograde fashion with the electrocautery. The gallbladder was removed and placed in an Endocatch sac. The liver bed  was irrigated and inspected. Hemostasis was achieved  with the electrocautery and Surgicel SNOW. Copious irrigation was utilized and was repeatedly aspirated until clear.  The gallbladder and Endocatch sac were then removed through the umbilical port site.  The pursestring suture was used to close the umbilical fascia.    We again inspected the right upper quadrant for hemostasis.  Pneumoperitoneum was released as we removed the trocars.  4-0 Monocryl was used to close the skin.   Benzoin, steri-strips, and clean dressings were applied. The patient was then extubated and brought to the recovery room in stable condition. Instrument, sponge, and needle counts were correct at closure and at the conclusion of the case.   Findings: Cholecystitis with Cholelithiasis  Estimated Blood Loss: Minimal         Drains: none         Specimens: Gallbladder           Complications: None; patient tolerated the procedure well.         Disposition: PACU - hemodynamically stable.         Condition: stable  Imogene Burn. Georgette Dover, MD, [facility]  General/ Trauma Surgery  11/02/2015 12:05 PM

## 2015-11-02 NOTE — H&P (View-Only) (Signed)
Steve Brown is an 75 y.o. male.   Chief Complaint: Abdominal pain HPI: This is a 75 yo male who presented to the ED on 10/13/15 with diffuse lower abdominal pain.  This has been present for about a week.  This is associated with some constipation.  The patient denies any nausea or vomiting.  The pain comes in waves.    The patient is on Xarelto, but he has not taken it since 10/13/15.   He underwent workup in the emergency department including CT scan and ultrasound.  This showed cholelithiasis with some pericholecystic fluid.  Direct admission and urgent cholecystectomy were recommended to the patient but he declined.  Today he feels well.  He has minimal abdominal tenderness.  He has been eating.  Bowel movements have been mildly constipated but otherwise normal.  He denies any nausea.  No fever.  Past Medical History:  Diagnosis Date  . Benign localized hyperplasia of prostate without urinary obstruction and other lower urinary tract symptoms (LUTS)   . Coronary artery disease   . Degeneration of cervical intervertebral disc   . Diabetes mellitus   . History of TIAs   . Hypersomnia with sleep apnea, unspecified   . Hypertension   . Lumbago   . Other and unspecified hyperlipidemia   . Pain in joint, multiple sites   . Personal history of unspecified circulatory disease   . Stroke [facility]) 2002, 2003   both sided weakness  . Trigger finger of both hands 11-17-13  . Type II or unspecified type diabetes mellitus without mention of complication, not stated as uncontrolled   . Unspecified cardiovascular disease   . Unspecified essential hypertension   . Unspecified fall   . Unsteady gait 05/19/2013   X 24 hours - improving. Hx prior CVA/TIAs; multiple risk factors; on Xarelto    Past Surgical History:  Procedure Laterality Date  . CARDIAC CATHETERIZATION    . CARPAL TUNNEL RELEASE     bilateral  . CERVICAL DISC SURGERY    . CORONARY ANGIOPLASTY    . CORONARY STENT PLACEMENT  Feb. 2010   . ELBOW SURGERY     right elbow - nerve release  . HERNIA REPAIR    . LEFT HEART CATHETERIZATION WITH CORONARY ANGIOGRAM N/A 04/03/2013   Procedure: LEFT HEART CATHETERIZATION WITH CORONARY ANGIOGRAM;  Surgeon: Jettie Booze, MD;  Location: [facility] CATH LAB;  Service: Cardiovascular;  Laterality: N/A;  . lungs     "fluid pumped off lungs"    Family History  Problem Relation Age of Onset  . Aneurysm Mother   . Cancer Father    Social History:  reports that he has never smoked. He has never used smokeless tobacco. He reports that he does not drink alcohol or use drugs.  Allergies:  Allergies  Allergen Reactions  . Phenergan [Promethazine] Other (See Comments)    Mood changes    Prior to Admission medications   Medication Sig Start Date End Date Taking? Authorizing Provider  amoxicillin-clavulanate (AUGMENTIN XR) 1000-62.5 MG 12 hr tablet Take 2 tablets by mouth 2 (two) times daily. 10/13/15   Deno Etienne, DO  Ascorbic Acid (VITAMIN C PO) Take 1 tablet by mouth daily.    Historical Provider, MD  aspirin EC 81 MG tablet Take 1 tablet (81 mg total) by mouth daily. 11/05/14   Nita Sells, MD  BD PEN NEEDLE NANO U/F 32G X 4 MM MISC  09/01/13   Historical Provider, MD  chlorhexidine (PERIDEX) 0.12 % solution Use  as directed 15 mLs in the mouth or throat 2 (two) times daily.  07/25/13   Historical Provider, MD  cholecalciferol (VITAMIN D) 1000 UNITS tablet Take 1,000 Units by mouth daily.    Historical Provider, MD  diclofenac sodium (VOLTAREN) 1 % GEL Apply 2 g topically 3 (three) times daily as needed (pain).    Historical Provider, MD  dutasteride (AVODART) 0.5 MG capsule Take 0.5 mg by mouth daily.    Historical Provider, MD  ferrous sulfate 325 (65 FE) MG tablet Take 325 mg by mouth daily with breakfast.    Historical Provider, MD  fluticasone (FLONASE) 50 MCG/ACT nasal spray Place 2 sprays into both nostrils daily as needed for allergies.  05/30/13   Historical Provider, MD   furosemide (LASIX) 20 MG tablet Take 20 mg by mouth daily as needed (swelling).     Historical Provider, MD  HUMALOG MIX 75/25 KWIKPEN (75-25) 100 UNIT/ML Kwikpen Inject into the skin. 20 units in am, 18 units in pm. 10/21/13   Historical Provider, MD  lactobacillus acidophilus & bulgar (LACTINEX) chewable tablet Chew 1 tablet by mouth 2 (two) times daily. 05/19/14   Historical Provider, MD  metFORMIN (GLUCOPHAGE) 500 MG tablet Take 2 tablets (1,000 mg total) by mouth 2 (two) times daily with a meal. Patient taking differently: Take 500 mg by mouth 2 (two) times daily with a meal.  04/05/13   Jayadeep S Varanasi, MD  Multiple Vitamin (MULTIVITAMIN WITH MINERALS) TABS Take 1 tablet by mouth every evening.     Historical Provider, MD  nitroGLYCERIN (NITROSTAT) 0.4 MG SL tablet Place 1 tablet (0.4 mg total) under the tongue every 5 (five) minutes as needed for chest pain. 04/03/13   Jayadeep S Varanasi, MD  nystatin (MYCOSTATIN) 100000 UNIT/ML suspension  10/09/13   Historical Provider, MD  ondansetron (ZOFRAN ODT) 4 MG disintegrating tablet 4mg ODT q4 hours prn nausea/vomit 10/13/15   Dan Floyd, DO  pantoprazole (PROTONIX) 40 MG tablet Take 40 mg by mouth daily.    Historical Provider, MD  prednisoLONE acetate (PRED FORTE) 1 % ophthalmic suspension Place 1 drop into both eyes as needed.  09/05/13   Historical Provider, MD  pregabalin (LYRICA) 300 MG capsule Take 1 capsule (300 mg total) by mouth 2 (two) times daily. 07/26/12   Carmen Dohmeier, MD  ramipril (ALTACE) 2.5 MG capsule Take 2.5 mg by mouth daily. 09/08/14   Historical Provider, MD  Rivaroxaban (XARELTO) 20 MG TABS tablet Take 1 tablet (20 mg total) by mouth daily with supper. Patient taking differently: Take 20 mg by mouth daily.  04/05/13   Jayadeep S Varanasi, MD  rosuvastatin (CRESTOR) 10 MG tablet Take 10 mg by mouth every evening.     Historical Provider, MD  valACYclovir (VALTREX) 500 MG tablet Take 500 mg by mouth daily.    Historical  Provider, MD     Results for orders placed or performed during the hospital encounter of 10/13/15 (from the past 48 hour(s))  CBC with Differential     Status: Abnormal   Collection Time: 10/13/15  4:30 PM  Result Value Ref Range   WBC 10.0 4.0 - 10.5 K/uL   RBC 4.49 4.22 - 5.81 MIL/uL   Hemoglobin 13.7 13.0 - 17.0 g/dL   HCT 40.6 39.0 - 52.0 %   MCV 90.4 78.0 - 100.0 fL   MCH 30.5 26.0 - 34.0 pg   MCHC 33.7 30.0 - 36.0 g/dL   RDW 12.8 11.5 - 15.5 %   Platelets   183 150 - 400 K/uL   Neutrophils Relative % 74 %   Neutro Abs 7.3 1.7 - 7.7 K/uL   Lymphocytes Relative 15 %   Lymphs Abs 1.5 0.7 - 4.0 K/uL   Monocytes Relative 11 %   Monocytes Absolute 1.1 (H) 0.1 - 1.0 K/uL   Eosinophils Relative 0 %   Eosinophils Absolute 0.0 0.0 - 0.7 K/uL   Basophils Relative 0 %   Basophils Absolute 0.0 0.0 - 0.1 K/uL  Comprehensive metabolic panel     Status: Abnormal   Collection Time: 10/13/15  4:30 PM  Result Value Ref Range   Sodium 135 135 - 145 mmol/L   Potassium 5.2 (H) 3.5 - 5.1 mmol/L   Chloride 103 101 - 111 mmol/L   CO2 26 22 - 32 mmol/L   Glucose, Bld 243 (H) 65 - 99 mg/dL   BUN 20 6 - 20 mg/dL   Creatinine, Ser 1.23 0.61 - 1.24 mg/dL   Calcium 9.2 8.9 - 10.3 mg/dL   Total Protein 7.2 6.5 - 8.1 g/dL   Albumin 3.8 3.5 - 5.0 g/dL   AST 23 15 - 41 U/L   ALT 17 17 - 63 U/L   Alkaline Phosphatase 48 38 - 126 U/L   Total Bilirubin 1.0 0.3 - 1.2 mg/dL   GFR calc non Af Amer 56 (L) >60 mL/min   GFR calc Af Amer >60 >60 mL/min    Comment: (NOTE) The eGFR has been calculated using the CKD EPI equation. This calculation has not been validated in all clinical situations. eGFR's persistently <60 mL/min signify possible Chronic Kidney Disease.    Anion gap 6 5 - 15  Lipase, blood     Status: None   Collection Time: 10/13/15  4:30 PM  Result Value Ref Range   Lipase 34 11 - 51 U/L   Ct Abdomen Pelvis W Contrast  Result Date: 10/13/2015 CLINICAL DATA:  Abdominal pain for 2 days  EXAM: CT ABDOMEN AND PELVIS WITH CONTRAST TECHNIQUE: Multidetector CT imaging of the abdomen and pelvis was performed using the standard protocol following bolus administration of intravenous contrast. CONTRAST:  100mL ISOVUE-300 IOPAMIDOL (ISOVUE-300) INJECTION 61% COMPARISON:  03/20/2014 FINDINGS: Lower chest: No acute abnormality. Coronary calcifications are noted. Hepatobiliary: There is a rounded hypodense lesion identified in the right lobe of the liver near the dome stable from the prior exam with peripheral enhancement consistent with small hepatic hemangioma. Gallbladder is well distended without evidence of cholelithiasis. Some mild pericholecystic fluid is seen. Pancreas: Unremarkable. No pancreatic ductal dilatation or surrounding inflammatory changes. Spleen: Normal in size without focal abnormality. Adrenals/Urinary Tract: The adrenal glands are within normal limits. No renal calculi or obstructive changes are seen. Bilateral renal cysts are seen stable from the prior exam. Bladder is well distended. A small diverticulum is noted adjacent to the bladder on the left. Stomach/Bowel: Stomach is within normal limits. Appendix appears normal. No evidence of bowel wall thickening, distention, or inflammatory changes. Vascular/Lymphatic: Aortic atherosclerosis. No enlarged abdominal or pelvic lymph nodes. Reproductive: Prostate is unremarkable. Other: No abdominal wall hernia or abnormality. No abdominopelvic ascites. Musculoskeletal: Degenerative changes of lumbar spine are seen. IMPRESSION: Distended gallbladder with pericholecystic fluid. This may represent acute cholecystitis. Ultrasound may be helpful for further evaluation. Stable changes in the kidneys Stable hepatic hemangioma. Electronically Signed   By: Mark  Lukens M.D.   On: 10/13/2015 19:04   Us Abdomen Limited Ruq  Result Date: 10/13/2015 CLINICAL DATA:  Abnormal gallbladder on recent CT   EXAM: US ABDOMEN LIMITED - RIGHT UPPER QUADRANT  COMPARISON:  CT from earlier in the same day FINDINGS: Gallbladder: Gallbladder wall thickening is noted to 1 cm. Cholelithiasis and gallbladder sludge is noted. Positive sonographic Percell Miller sign is noted although diffuse abdominal pain is noted as well making this less specific. Common bile duct: Diameter: 7 mm Liver: No focal lesion identified. Within normal limits in parenchymal echogenicity. IMPRESSION: Changes consistent with acute cholecystitis. Cholelithiasis and gallbladder sludge is noted as well. Electronically Signed   By: Inez Catalina M.D.   On: 10/13/2015 21:00    Review of Systems  Constitutional: Negative for weight loss.  HENT: Negative for ear discharge, ear pain, hearing loss and tinnitus.   Eyes: Negative for blurred vision, double vision, photophobia and pain.  Respiratory: Negative for cough, sputum production and shortness of breath.   Cardiovascular: Negative for chest pain.  Gastrointestinal: Positive for abdominal pain and constipation. Negative for nausea and vomiting.  Genitourinary: Negative for dysuria, flank pain, frequency and urgency.  Musculoskeletal: Negative for back pain, falls, joint pain, myalgias and neck pain.  Neurological: Negative for dizziness, tingling, sensory change, focal weakness, loss of consciousness and headaches.  Endo/Heme/Allergies: Does not bruise/bleed easily.  Psychiatric/Behavioral: Negative for depression, memory loss and substance abuse. The patient is not nervous/anxious.    Physical Exam  WDWN in NAD Eyes:  Pupils equal, round; sclera anicteric HENT:  Oral mucosa moist; good dentition  Neck:  No masses palpated, no thyromegaly Lungs:  CTA bilaterally; normal respiratory effort CV:  Regular rate and rhythm; no murmurs; extremities well-perfused with no edema Abd:  +bowel sounds, soft, protuberant; large upper midline rectus diastasis; healed umbilical hernia scar with no sign of hernia Minimal RUQ/ epigastric abdominal tenderness;  much improved from 10/4. Skin:  Warm, dry; no sign of jaundice Psychiatric - alert and oriented x 4; calm mood and affect  Assessment/Plan Acute calculus cholecystitis - symptoms seem to be resolving.  Will obtain cardiac and neurologic clearance from Drs. [place] and [place].  Will need to hold anticoagulation prior to surgery.  Recommend laparoscopic cholecystectomy with intraoperative cholangiogram.  The surgical procedure has been discussed with the patient.  Potential risks, benefits, alternative treatments, and expected outcomes have been explained.  All of the patient's questions at this time have been answered.  The likelihood of reaching the patient's treatment goal is good.  The patient understand the proposed surgical procedure and wishes to proceed.   Maia Petties., MD 10/15/2015, 9:24 AM

## 2015-11-02 NOTE — Transfer of Care (Signed)
Immediate Anesthesia Transfer of Care Note  Patient: Steve Brown  Procedure(s) Performed: Procedure(s): LAPAROSCOPIC CHOLECYSTECTOMY WITH INTRAOPERATIVE CHOLANGIOGRAM (N/A)  Patient Location: PACU  Anesthesia Type:General  Level of Consciousness: awake, alert , oriented and patient cooperative  Airway & Oxygen Therapy: Patient Spontanous Breathing and Patient connected to nasal cannula oxygen  Post-op Assessment: Report given to RN and Post -op Vital signs reviewed and stable  Post vital signs: Reviewed and stable  Last Vitals:  Vitals:   11/02/15 0859 11/02/15 1220  BP: (!) 156/82   Pulse: 70   Resp: 18   Temp: 36.5 C 36.3 C    Last Pain:  Vitals:   11/02/15 1220  TempSrc:   PainSc: 0-No pain      Patients Stated Pain Goal: 3 ([id number])  Complications: No apparent anesthesia complications

## 2015-11-03 ENCOUNTER — Encounter (HOSPITAL_COMMUNITY): Payer: Self-pay | Admitting: Surgery

## 2015-11-03 DIAGNOSIS — I2510 Atherosclerotic heart disease of native coronary artery without angina pectoris: Secondary | ICD-10-CM | POA: Diagnosis not present

## 2015-11-03 DIAGNOSIS — K828 Other specified diseases of gallbladder: Secondary | ICD-10-CM | POA: Diagnosis not present

## 2015-11-03 DIAGNOSIS — K8010 Calculus of gallbladder with chronic cholecystitis without obstruction: Secondary | ICD-10-CM | POA: Diagnosis not present

## 2015-11-03 DIAGNOSIS — N400 Enlarged prostate without lower urinary tract symptoms: Secondary | ICD-10-CM | POA: Diagnosis not present

## 2015-11-03 DIAGNOSIS — E119 Type 2 diabetes mellitus without complications: Secondary | ICD-10-CM | POA: Diagnosis not present

## 2015-11-03 DIAGNOSIS — M5030 Other cervical disc degeneration, unspecified cervical region: Secondary | ICD-10-CM | POA: Diagnosis not present

## 2015-11-03 LAB — GLUCOSE, CAPILLARY
Glucose-Capillary: 312 mg/dL — ABNORMAL HIGH (ref 65–99)
Glucose-Capillary: 331 mg/dL — ABNORMAL HIGH (ref 65–99)

## 2015-11-03 MED ORDER — OXYCODONE-ACETAMINOPHEN 5-325 MG PO TABS: 1.0000 | ORAL_TABLET | ORAL | 0 refills | Status: DC | PRN

## 2015-11-03 NOTE — Discharge Instructions (Signed)
[facility] ______[facility] °LAPAROSCOPIC SURGERY: POST OP INSTRUCTIONS °Always review your discharge instruction sheet given to you by the facility where your surgery was performed. °IF YOU HAVE DISABILITY OR FAMILY LEAVE FORMS, YOU MUST BRING THEM TO THE OFFICE FOR PROCESSING.   °DO NOT GIVE THEM TO YOUR DOCTOR. ° °1. A prescription for pain medication may be given to you upon discharge.  Take your pain medication as prescribed, if needed.  If narcotic pain medicine is not needed, then you may take acetaminophen (Tylenol) or ibuprofen (Advil) as needed. °2. Take your usually prescribed medications unless otherwise directed. °3. If you need a refill on your pain medication, please contact your pharmacy.  They will contact our office to request authorization. Prescriptions will not be filled after 5pm or on week-ends. °4. You should follow a light diet the first few days after arrival home, such as soup and crackers, etc.  Be sure to include lots of fluids daily. °5. Most patients will experience some swelling and bruising in the area of the incisions.  Ice packs will help.  Swelling and bruising can take several days to resolve.  °6. It is common to experience some constipation if taking pain medication after surgery.  Increasing fluid intake and taking a stool softener (such as Colace) will usually help or prevent this problem from occurring.  A mild laxative (Milk of Magnesia or Miralax) should be taken according to package instructions if there are no bowel movements after 48 hours. °7. Unless discharge instructions indicate otherwise, you may remove your bandages 24-48 hours after surgery, and you may shower at that time.  You may have steri-strips (small skin tapes) in place directly over the incision.  These strips should be left on the skin for 7-10 days.  If your surgeon used skin glue on the incision, you may shower in 24 hours.  The glue will flake off over the next 2-3 weeks.  Any sutures or  staples will be removed at the office during your follow-up visit. °8. ACTIVITIES:  You may resume regular (light) daily activities beginning the next day--such as daily self-care, walking, climbing stairs--gradually increasing activities as tolerated.  You may have sexual intercourse when it is comfortable.  Refrain from any heavy lifting or straining until approved by your doctor. °a. You may drive when you are no longer taking prescription pain medication, you can comfortably wear a seatbelt, and you can safely maneuver your car and apply brakes. °b. RETURN TO WORK:  __________________________________________________________ °9. You should see your doctor in the office for a follow-up appointment approximately 2-3 weeks after your surgery.  Make sure that you call for this appointment within a day or two after you arrive home to insure a convenient appointment time. °10. OTHER INSTRUCTIONS: __________________________________________________________________________________________________________________________ __________________________________________________________________________________________________________________________ °WHEN TO CALL YOUR DOCTOR: °1. Fever over 101.0 °2. Inability to urinate °3. Continued bleeding from incision. °4. Increased pain, redness, or drainage from the incision. °5. Increasing abdominal pain ° °The clinic staff is available to answer your questions during regular business hours.  Please don’t hesitate to call and ask to speak to one of the nurses for clinical concerns.  If you have a medical emergency, go to the nearest emergency room or call 911.  A surgeon from [facility] is always on call at the hospital. °[address], [address], [place], [place]  [address] ? [address] [address], , [place]   [address] °[phone] ? [phone] ? FAX [phone] °Web site:   [email or web address] °

## 2015-11-03 NOTE — Progress Notes (Signed)
IV taken out. Pt. D/c'd.Steve Brown

## 2015-11-03 NOTE — Discharge Summary (Signed)
Physician Discharge Summary  Patient ID: Steve Brown MRN: [id number] DOB/AGE: 1940/09/26 75 y.o.  Admit date: 11/02/2015 Discharge date: 11/03/2015  Admission Diagnoses:  Chronic calculus cholecystitis  Discharge Diagnoses: same Active Problems:   Chronic cholecystitis with calculus   Discharged Condition: good.    Hospital Course: Lap chole with IOC on 10/25.  Kept overnight for observation and pain control.  No problems overnight.  Ready for discharge.  Consults: None  Significant Diagnostic Studies: radiology: IOC  Dg Cholangiogram Operative  Result Date: 11/02/2015 CLINICAL DATA:  Intraoperative cholangiogram during laparoscopic cholecystectomy. EXAM: INTRAOPERATIVE CHOLANGIOGRAM FLUOROSCOPY TIME:  14 seconds COMPARISON:  Right upper quadrant abdominal ultrasound - 10/13/2015; CT abdomen and pelvis - 10/13/2015 FINDINGS: Intraoperative cholangiographic images of the right upper abdominal quadrant during laparoscopic cholecystectomy are provided for review. Surgical clips overlie the expected location of the gallbladder fossa. Contrast injection demonstrates selective cannulation of the central aspect of the cystic duct. There is passage of contrast through the central aspect of the cystic duct with filling of a non dilated common bile duct. There is passage of contrast though the CBD and into the descending portion of the duodenum. There is minimal reflux of injected contrast into the common hepatic duct and central aspect of the non dilated intrahepatic biliary system. There are no discrete filling defects within the opacified portions of the biliary system to suggest the presence of choledocholithiasis. IMPRESSION: No evidence of choledocholithiasis. Electronically Signed   By: Sandi Mariscal M.D.   On: 11/02/2015 14:13   Ct Abdomen Pelvis W Contrast  Result Date: 10/13/2015 CLINICAL DATA:  Abdominal pain for 2 days EXAM: CT ABDOMEN AND PELVIS WITH CONTRAST TECHNIQUE: Multidetector  CT imaging of the abdomen and pelvis was performed using the standard protocol following bolus administration of intravenous contrast. CONTRAST:  132mL ISOVUE-300 IOPAMIDOL (ISOVUE-300) INJECTION 61% COMPARISON:  03/20/2014 FINDINGS: Lower chest: No acute abnormality. Coronary calcifications are noted. Hepatobiliary: There is a rounded hypodense lesion identified in the right lobe of the liver near the dome stable from the prior exam with peripheral enhancement consistent with small hepatic hemangioma. Gallbladder is well distended without evidence of cholelithiasis. Some mild pericholecystic fluid is seen. Pancreas: Unremarkable. No pancreatic ductal dilatation or surrounding inflammatory changes. Spleen: Normal in size without focal abnormality. Adrenals/Urinary Tract: The adrenal glands are within normal limits. No renal calculi or obstructive changes are seen. Bilateral renal cysts are seen stable from the prior exam. Bladder is well distended. A small diverticulum is noted adjacent to the bladder on the left. Stomach/Bowel: Stomach is within normal limits. Appendix appears normal. No evidence of bowel wall thickening, distention, or inflammatory changes. Vascular/Lymphatic: Aortic atherosclerosis. No enlarged abdominal or pelvic lymph nodes. Reproductive: Prostate is unremarkable. Other: No abdominal wall hernia or abnormality. No abdominopelvic ascites. Musculoskeletal: Degenerative changes of lumbar spine are seen. IMPRESSION: Distended gallbladder with pericholecystic fluid. This may represent acute cholecystitis. Ultrasound may be helpful for further evaluation. Stable changes in the kidneys Stable hepatic hemangioma. Electronically Signed   By: Inez Catalina M.D.   On: 10/13/2015 19:04   US Abdomen Limited Ruq  Result Date: 10/13/2015 CLINICAL DATA:  Abnormal gallbladder on recent CT EXAM: US ABDOMEN LIMITED - RIGHT UPPER QUADRANT COMPARISON:  CT from earlier in the same day FINDINGS: Gallbladder:  Gallbladder wall thickening is noted to 1 cm. Cholelithiasis and gallbladder sludge is noted. Positive sonographic Percell Miller sign is noted although diffuse abdominal pain is noted as well making this less specific. Common bile duct: Diameter: 7 mm  Liver: No focal lesion identified. Within normal limits in parenchymal echogenicity. IMPRESSION: Changes consistent with acute cholecystitis. Cholelithiasis and gallbladder sludge is noted as well. Electronically Signed   By: Inez Catalina M.D.   On: 10/13/2015 21:00     Treatments:Lap chole with IOC  Discharge Exam: Blood pressure (!) 123/56, pulse 64, temperature 99.2 F (37.3 C), temperature source Oral, resp. rate 18, height 5\' 5"  (1.651 m), weight 84.2 kg (185 lb 10 oz), SpO2 100 %. General appearance: alert, cooperative and no distress Resp: clear to auscultation bilaterally Cardio: regular rate and rhythm, S1, S2 normal, no murmur, click, rub or gallop GI: soft, incisional tenderness; dressings c/d/i except for some staining on umbilical dressing  Disposition: home  Discharge Instructions    Call MD for:  persistant nausea and vomiting    Complete by:  As directed    Call MD for:  redness, tenderness, or signs of infection (pain, swelling, redness, odor or green/yellow discharge around incision site)    Complete by:  As directed    Call MD for:  severe uncontrolled pain    Complete by:  As directed    Call MD for:  temperature >100.4    Complete by:  As directed    Diet general    Complete by:  As directed    Driving Restrictions    Complete by:  As directed    Do not drive while taking pain medications   Increase activity slowly    Complete by:  As directed    May shower / Bathe    Complete by:  As directed        Medication List    STOP taking these medications   ondansetron 4 MG disintegrating tablet Commonly known as:  ZOFRAN ODT     TAKE these medications   amoxicillin-clavulanate 1000-62.5 MG 12 hr tablet Commonly  known as:  AUGMENTIN XR Take 2 tablets by mouth 2 (two) times daily.   aspirin EC 81 MG tablet Take 1 tablet (81 mg total) by mouth daily.   BD PEN NEEDLE NANO U/F 32G X 4 MM Misc Generic drug:  Insulin Pen Needle by Does not apply route.   bisacodyl 5 MG EC tablet Commonly known as:  DULCOLAX Take 5 mg by mouth daily as needed for moderate constipation.   chlorhexidine 0.12 % solution Commonly known as:  PERIDEX Use as directed 15 mLs in the mouth or throat 2 (two) times daily.   cholecalciferol 1000 units tablet Commonly known as:  VITAMIN D Take 1,000 Units by mouth daily.   diclofenac sodium 1 % Gel Commonly known as:  VOLTAREN Apply 2 g topically 3 (three) times daily as needed (pain).   dutasteride 0.5 MG capsule Commonly known as:  AVODART Take 0.5 mg by mouth daily.   ferrous sulfate 325 (65 FE) MG tablet Take 325 mg by mouth daily with breakfast.   fluticasone 50 MCG/ACT nasal spray Commonly known as:  FLONASE Place 2 sprays into both nostrils daily as needed for allergies.   furosemide 20 MG tablet Commonly known as:  LASIX Take 20 mg by mouth daily as needed (swelling).   HUMALOG MIX 75/25 KWIKPEN (75-25) 100 UNIT/ML Kwikpen Generic drug:  Insulin Lispro Prot & Lispro Inject 18-20 Units into the skin 2 (two) times daily. 18 units in evening, and 20 units in the morning.   lactobacillus acidophilus & bulgar chewable tablet Chew 1 tablet by mouth daily.   metFORMIN 500 MG tablet Commonly  known as:  GLUCOPHAGE Take 500 mg by mouth 2 (two) times daily with a meal.   multivitamin with minerals Tabs tablet Take 1 tablet by mouth every evening.   nitroGLYCERIN 0.4 MG SL tablet Commonly known as:  NITROSTAT Place 1 tablet (0.4 mg total) under the tongue every 5 (five) minutes as needed for chest pain.   nystatin 100000 UNIT/ML suspension Commonly known as:  MYCOSTATIN Take 5 mLs by mouth 2 (two) times daily as needed (for oral thrush.).    oxyCODONE-acetaminophen 5-325 MG tablet Commonly known as:  PERCOCET/ROXICET Take 1-2 tablets by mouth every 4 (four) hours as needed for moderate pain.   pantoprazole 40 MG tablet Commonly known as:  PROTONIX Take 40 mg by mouth daily.   pregabalin 300 MG capsule Commonly known as:  LYRICA Take 1 capsule (300 mg total) by mouth 2 (two) times daily.   ramipril 2.5 MG capsule Commonly known as:  ALTACE Take 2.5 mg by mouth daily.   rivaroxaban 20 MG Tabs tablet Commonly known as:  XARELTO Take 20 mg by mouth daily.   rosuvastatin 10 MG tablet Commonly known as:  CRESTOR Take 10 mg by mouth every evening.   valACYclovir 500 MG tablet Commonly known as:  VALTREX Take 500 mg by mouth daily.   VITAMIN C PO Take 1 tablet by mouth daily.      Follow-up Information    Jeancarlo Leffler K., MD. Schedule an appointment as soon as possible for a visit in 3 week(s).   Specialty:  General Surgery Contact information: [address] [place] [address] [phone]           Signed: Maia Petties. 11/03/2015, 7:31 AM

## 2015-11-03 NOTE — Progress Notes (Signed)
D/C papers gone over with pt. Prescription given to pt. NO questions/complaints.

## 2015-11-16 DIAGNOSIS — Z23 Encounter for immunization: Secondary | ICD-10-CM | POA: Diagnosis not present

## 2015-11-18 ENCOUNTER — Encounter: Payer: Self-pay | Admitting: Neurology

## 2015-11-18 ENCOUNTER — Ambulatory Visit (INDEPENDENT_AMBULATORY_CARE_PROVIDER_SITE_OTHER): Payer: Medicare Other | Admitting: Neurology

## 2015-11-18 VITALS — BP 138/62 | HR 64 | Resp 20 | Ht 65.0 in | Wt 182.0 lb

## 2015-11-18 DIAGNOSIS — Z7189 Other specified counseling: Secondary | ICD-10-CM

## 2015-11-18 DIAGNOSIS — Z9989 Dependence on other enabling machines and devices: Secondary | ICD-10-CM | POA: Diagnosis not present

## 2015-11-18 DIAGNOSIS — G4733 Obstructive sleep apnea (adult) (pediatric): Secondary | ICD-10-CM | POA: Diagnosis not present

## 2015-11-18 DIAGNOSIS — I2510 Atherosclerotic heart disease of native coronary artery without angina pectoris: Secondary | ICD-10-CM | POA: Diagnosis not present

## 2015-11-18 NOTE — Progress Notes (Signed)
PATIENT: Steve Brown DOB: Sep 23, 1940  REASON FOR VISIT: follow up- osa on cpap HISTORY FROM: patient  HISTORY OF PRESENT ILLNESS:   11-18-2015  Steve Brown is a 75 year old male with a history of obstructive sleep apnea on CPAP. He returns today for follow-up. His last download from May 2017 ndicated that he uses machine 27 out of 30 days for compliance of 90%.   He stopped using his CPAP at the end of June 2017, had felt sick for while, finally underwent gallbladder surgery after month of abdominal pain. The gallbladder inflammation did cause some sleep interruption and some discomfort at night, He was tossing and turning and also rubbing alcohol on his belly to relieve the symptoms. Now since the surgery he has regained the ability to sleep more soundly and is wanting to return to CPAP use. His daughter suffered a stroke earlier this month, he is worried. For the months of June he has a compliance of 22 out of 30 days 75% for 6 hours and 13 minutes on average use the 9 cm setting with an AHI of 0.9. He also reports that he has a hard time sleeping on his back. Therefore when he sleeps on his sides occasionally the pillow will push the mask off his face causing a leak.  He reports that he does try to exercise daily. He has continued on Xarelto and aspirin for stroke prevention. His primary care is managing his hypertension and hyperlipidemia. He denies any additional strokelike symptoms.   HISTORY 11/18/14: Steve Brown is a 75 year old male with a history of obstructive sleep apnea on CPAP. He returns today for a follow-up. The patient CPAP download indicates that he uses machine 21 out of 30 days for compliance of 70%. He only uses machine 20 out of 30 days for compliance of 67%. On average he uses his machine 6 hours and 2 minutes. His residual AHI is 1.6 on 9 cm of water with EPR 1. He has a leak and the 95th percentile at 26.5 L/m. The patient was recently in the hospital for a TIA event.  The patient experienced weakness in the left arm. He called EMS and the weakness resolved prior to their arrival. He was admitted to the hospital CT scan and MRI was negative. Carotid Dopplers were unremarkable. The patient had a 2-D echo in 2015- results are in [place]. The patient remains on Xarelto after having a PE. He is also on aspirin The patient's blood pressure remains under good control. His primary care manages his hypertension hyperlipidemia and diabetes. He denies any additional strokelike symptoms. He is starting physical therapy this week. He returns today for an evaluation.  HISTORY HISTORY 08/14/13 (CD): Steve Brown is meanwhile 75 years old and has been staying active. He has controlled his weight he is physically active, he also has undergone a complete course of physical therapy since last visit. After out last visit, we had obtained an MRI of the brain without contrast and compared to the study from earlier the same year : there was no change in comparison to the May CT on this MRI dated 07-04-13 .  The patient has few scattered small vessel injuries and some lacunar infarcts.  The main risk factor for these as hypertension and diabetes- Occupational therapy saw him for left arm pain. Additional risk factor is the patient's diabetes mellitus condition. I referred him for occupational therapy to get further insight in what may have caused his arm pain.  He noted  that he could left pounds with the left 3 pounds with her right hand now there is still a significant difference .  resulting in the conduction study and EMG study which was then performed on 07-09-13, normal study no evidence of large fiber neuropathy in the left upper extremity deltoid, biceps, triceps and flexor carpi radialis as well as interosseus Muscles; no abnormal EMG activity.  He underwent a sleep study at [facility] : results from 08-07-13 reviewed today :  Steve Brown was diagnosed with severe sleep apnea at an AHI of  65.9. Lowest point of oxygen saturation was 70%. He was titrated to CPAP at 9 cm water he slept 92.7 minutes at that pressure of which 23 minutes of REM sleep. The AHI was now 0.6 per hour. The previously very fragmented sleep architecture became now essentially normal. The technologist used a PIC all equal nasal mask. The patient is asked to return in about 50 days for a followup with the CPAP machine.  I also reviewed his long medication list : patient has been on Xeralto for chronic anticoagulation. UPDATE 05/18/14 Steve Brown, who was diagnosed with severe sleep apnea at an AHI of 65.9, has made efforts to increase his CPAP compliance. CPAP was initiated to reduce his secondary CVA risk.  He has used the machine over the last 30 days still only on 18 days, and all these days over 4 hours his compliance is 53% his average user time is 6 hours and 18 minutes his EPR is 1 cm and a set pressure 9 cm water unchanged from the last year. His residual AHI was 0.9 documenting that the setting is effective. Fatigue severity score was only 18 and sleepiness score 6 points again I at contribute to the lower sleepiness score also to his use of CPAP. He stated that he had some sinusitis problems that kept him from using CPAP and has just seen Dr. Ernesto Rutherford who prescribed no antibiotics after other measures had failed. He is reluctant to use an anti-decongestant which I would also not recommend because it can spike his blood pressure. He has Flonase which Dr. Harrington Challenger his primary care physician has prescribed for him. I will see him again in one year for CPAP compliance visit but urged him to try to use the machine every night or at least if he has a medical reason not to use it to discontinue the use for 3 or 4 days until he recovers and then start again. He has also gained some weight which is a risk factor for obstructive sleep apnea alongside his retrognathia. He is now 185 pounds.   REVIEW OF SYSTEMS: Out of a  complete 14 system review of symptoms, the patient complains only of the following symptoms, and all other reviewed systems are negative.  Frequency of urination, numbness  ALLERGIES: Allergies  Allergen Reactions  . Other Other (See Comments)    Per patient- cardiac cath dye-  "woke up during procedure hysterical."  . Phenergan [Promethazine] Other (See Comments)    Mood changes     HOME MEDICATIONS: Outpatient Medications Prior to Visit  Medication Sig Dispense Refill  . amoxicillin-clavulanate (AUGMENTIN XR) 1000-62.5 MG 12 hr tablet Take 2 tablets by mouth 2 (two) times daily. 28 tablet 0  . Ascorbic Acid (VITAMIN C PO) Take 1 tablet by mouth daily.    Marland Kitchen aspirin EC 81 MG tablet Take 1 tablet (81 mg total) by mouth daily.    . BD PEN NEEDLE NANO U/F  32G X 4 MM MISC by Does not apply route.     . bisacodyl (DULCOLAX) 5 MG EC tablet Take 5 mg by mouth daily as needed for moderate constipation.    . chlorhexidine (PERIDEX) 0.12 % solution Use as directed 15 mLs in the mouth or throat 2 (two) times daily.     . cholecalciferol (VITAMIN D) 1000 UNITS tablet Take 1,000 Units by mouth daily.    . diclofenac sodium (VOLTAREN) 1 % GEL Apply 2 g topically 3 (three) times daily as needed (pain).    Marland Kitchen dutasteride (AVODART) 0.5 MG capsule Take 0.5 mg by mouth daily.    . ferrous sulfate 325 (65 FE) MG tablet Take 325 mg by mouth daily with breakfast.    . fluticasone (FLONASE) 50 MCG/ACT nasal spray Place 2 sprays into both nostrils daily as needed for allergies.     . furosemide (LASIX) 20 MG tablet Take 20 mg by mouth daily as needed (swelling).     Marland Kitchen HUMALOG MIX 75/25 KWIKPEN (75-25) 100 UNIT/ML Kwikpen Inject 18-20 Units into the skin 2 (two) times daily. 18 units in evening, and 20 units in the morning.  1  . lactobacillus acidophilus & bulgar (LACTINEX) chewable tablet Chew 1 tablet by mouth daily.   0  . metFORMIN (GLUCOPHAGE) 500 MG tablet Take 500 mg by mouth 2 (two) times daily with a  meal.    . Multiple Vitamin (MULTIVITAMIN WITH MINERALS) TABS Take 1 tablet by mouth every evening.     . nitroGLYCERIN (NITROSTAT) 0.4 MG SL tablet Place 1 tablet (0.4 mg total) under the tongue every 5 (five) minutes as needed for chest pain. 25 tablet 12  . nystatin (MYCOSTATIN) 100000 UNIT/ML suspension Take 5 mLs by mouth 2 (two) times daily as needed (for oral thrush.).   3  . oxyCODONE-acetaminophen (PERCOCET/ROXICET) 5-325 MG tablet Take 1-2 tablets by mouth every 4 (four) hours as needed for moderate pain. 30 tablet 0  . pantoprazole (PROTONIX) 40 MG tablet Take 40 mg by mouth daily.    . pregabalin (LYRICA) 300 MG capsule Take 1 capsule (300 mg total) by mouth 2 (two) times daily. 180 capsule 3  . ramipril (ALTACE) 2.5 MG capsule Take 2.5 mg by mouth daily.  1  . rivaroxaban (XARELTO) 20 MG TABS tablet Take 20 mg by mouth daily.     . rosuvastatin (CRESTOR) 10 MG tablet Take 10 mg by mouth every evening.     . valACYclovir (VALTREX) 500 MG tablet Take 500 mg by mouth daily.     No facility-administered medications prior to visit.     PAST MEDICAL HISTORY: Past Medical History:  Diagnosis Date  . Benign localized hyperplasia of prostate without urinary obstruction and other lower urinary tract symptoms (LUTS)   . Blood dyscrasia   . Coronary artery disease   . Degeneration of cervical intervertebral disc   . Depression   . Diabetes mellitus   . Dyspnea    with exertion  . GERD (gastroesophageal reflux disease)   . History of TIAs   . Hypersomnia with sleep apnea, unspecified   . Hypertension   . Lumbago   . Neuropathy ([place])   . Other and unspecified hyperlipidemia   . Pain in joint, multiple sites   . PE (pulmonary thromboembolism) ([place])   . Personal history of unspecified circulatory disease   . Pneumonia 2005   2010  . Sleep apnea   . Stroke [facility]) 2002, 2003   both sided  weakness  . Trigger finger of both hands 11-17-13  . Type II or unspecified type diabetes  mellitus without mention of complication, not stated as uncontrolled   . Unspecified cardiovascular disease   . Unspecified essential hypertension   . Unspecified fall   . Unsteady gait 05/19/2013   X 24 hours - improving. Hx prior CVA/TIAs; multiple risk factors; on Xarelto    PAST SURGICAL HISTORY: Past Surgical History:  Procedure Laterality Date  . CARDIAC CATHETERIZATION    . CARPAL TUNNEL RELEASE     bilateral  . CERVICAL DISC SURGERY    . CHOLECYSTECTOMY N/A 11/02/2015   Procedure: LAPAROSCOPIC CHOLECYSTECTOMY WITH INTRAOPERATIVE CHOLANGIOGRAM;  Surgeon: Donnie Mesa, MD;  Location: [place];  Service: General;  Laterality: N/A;  . COLONOSCOPY    . CORONARY ANGIOPLASTY    . CORONARY STENT PLACEMENT  Feb. 2010  . ELBOW SURGERY     right elbow - nerve release  . EYE SURGERY Bilateral    cataract  . HERNIA REPAIR     umbicical  . LEFT HEART CATHETERIZATION WITH CORONARY ANGIOGRAM N/A 04/03/2013   Procedure: LEFT HEART CATHETERIZATION WITH CORONARY ANGIOGRAM;  Surgeon: Jettie Booze, MD;  Location: [facility] CATH LAB;  Service: Cardiovascular;  Laterality: N/A;  . lungs     "fluid pumped off lungs"    FAMILY HISTORY: Family History  Problem Relation Age of Onset  . Aneurysm Mother   . Cancer Father   . Heart attack Neg Hx     SOCIAL HISTORY: Social History   Social History  . Marital status: Divorced    Spouse name: N/A  . Number of children: 4  . Years of education: 12   Occupational History  .  Retired    retired   Social History Main Topics  . Smoking status: Never Smoker  . Smokeless tobacco: Never Used  . Alcohol use No  . Drug use: No  . Sexual activity: Not on file   Other Topics Concern  . Not on file   Social History Narrative   Patient lives at home alone and he is single.  Patient is retired.    Caffeine - one cups daily.   Right handed.   Patient has a high school education.   Patient has four adult children.      PHYSICAL  EXAM  Vitals:   11/18/15 1051  BP: 138/62  Pulse: 64  Resp: 20  Weight: 182 lb (82.6 kg)  Height: 5\' 5"  (1.651 m)   Body mass index is 30.29 kg/m.  Generalized: Well developed, in no acute distress   Neurological examination  Mentation: Alert oriented to time, place, history taking. Follows all commands speech and language fluent Cranial nerve II-XII: Pupils were equal round reactive to light. Extraocular movements were full, visual field were full on confrontational test. Facial sensation and strength were normal. Uvula tongue midline. Mallampati 3+ Head turning and shoulder shrug  were normal and symmetric. Motor: The motor testing reveals 5 over 5 strength of all 4 extremities. Good symmetric motor tone is noted throughout.  Sensory: Sensory testing is intact to soft touch on all 4 extremities. No evidence of extinction is noted.  Coordination: Cerebellar testing reveals good finger-nose-finger and heel-to-shin bilaterally.  Gait and station: Gait is normal. Tandem gait is normal. Romberg is negative. No drift is seen.  Reflexes: Deep tendon reflexes are symmetric and normal bilaterally.   DIAGNOSTIC DATA (LABS, IMAGING, TESTING) - I reviewed patient records, labs, notes, testing and  imaging myself where available.  Lab Results  Component Value Date   WBC 4.8 10/29/2015   HGB 13.4 10/29/2015   HCT 40.0 10/29/2015   MCV 90.3 10/29/2015   PLT 246 10/29/2015      Component Value Date/Time   NA 137 10/29/2015 1053   NA 140 08/14/2012 1513   K 4.5 10/29/2015 1053   K 4.3 08/14/2012 1513   CL 103 10/29/2015 1053   CO2 29 10/29/2015 1053   CO2 27 08/14/2012 1513   GLUCOSE 139 (H) 10/29/2015 1053   GLUCOSE 262 (H) 08/14/2012 1513   BUN 12 10/29/2015 1053   BUN 19.0 08/14/2012 1513   CREATININE 1.11 10/29/2015 1053   CREATININE 1.29 03/19/2014 1130   CREATININE 1.3 08/14/2012 1513   CALCIUM 8.9 10/29/2015 1053   CALCIUM 9.3 08/14/2012 1513   PROT 7.2 10/13/2015 1630    PROT 6.9 08/14/2012 1513   ALBUMIN 3.8 10/13/2015 1630   ALBUMIN 3.4 (L) 08/14/2012 1513   AST 23 10/13/2015 1630   AST 15 08/14/2012 1513   ALT 17 10/13/2015 1630   ALT 14 08/14/2012 1513   ALKPHOS 48 10/13/2015 1630   ALKPHOS 92 08/14/2012 1513   BILITOT 1.0 10/13/2015 1630   BILITOT 0.39 08/14/2012 1513   GFRNONAA >60 10/29/2015 1053   GFRNONAA 67 05/19/2013 1108   GFRAA >60 10/29/2015 1053   GFRAA 78 05/19/2013 1108   Lab Results  Component Value Date   CHOL 144 03/21/2013   HDL 57 03/21/2013   LDLCALC 63 03/21/2013   TRIG 122 03/21/2013   CHOLHDL 2.5 03/21/2013   Lab Results  Component Value Date   HGBA1C 7.8 (H) 10/29/2015    Lab Results  Component Value Date   TSH 0.618 03/20/2013      ASSESSMENT AND PLAN 75 y.o. year old male  has a past medical history of Benign localized hyperplasia of prostate without urinary obstruction and other lower urinary tract symptoms (LUTS); Blood dyscrasia; Coronary artery disease; Degeneration of cervical intervertebral disc; Depression; Diabetes mellitus; Dyspnea; GERD (gastroesophageal reflux disease); History of TIAs; Hypersomnia with sleep apnea, unspecified; Hypertension; Lumbago; Neuropathy ([place]); Other and unspecified hyperlipidemia; Pain in joint, multiple sites; PE (pulmonary thromboembolism) ([place]); Personal history of unspecified circulatory disease; Pneumonia (2005); Sleep apnea; Stroke [facility]) (2002, 2003); Trigger finger of both hands (11-17-13); Type II or unspecified type diabetes mellitus without mention of complication, not stated as uncontrolled; Unspecified cardiovascular disease; Unspecified essential hypertension; Unspecified fall; and Unsteady gait (05/19/2013). here for a 15 minute visit for compliance.  I was surprised to find him non compliant for the last 2 month , but he has been hospitalized, had cholecystectomy surgery and multiple sinus infections. He is now ready to restart.   1. Obstructive sleep apnea on CPAP,   Steve Brown still has supplies at home to allow him to start using CPAP compliantly again. CPAP is currently set at 9 cm water pressure was 1 cm EPR and when using CPAP he averaged6 hours and 13 minutes of nocturnal user time. Residual AHI was 0.9. These data are from June. As he explained he had other stressors and medical issues affecting his compliance but he is willing to return. His fatigue severity score today was 36 points and his Epworth sleepiness score 6 points. This degree of sleepiness should not affect him driving safely or operating machinery.  No supplies to order, he has enough at home.   RV in 6 month with new compliance data - NP to see  patient.        Detria Cummings, MD  11/18/2015, 11:08 AM [facility] [address] [place], [place] [address] [phone]

## 2015-12-06 ENCOUNTER — Telehealth: Payer: Self-pay | Admitting: Interventional Cardiology

## 2015-12-06 MED ORDER — NITROGLYCERIN 0.4 MG SL SUBL: 0.4000 mg | SUBLINGUAL_TABLET | SUBLINGUAL | 1 refills | Status: DC | PRN

## 2015-12-06 NOTE — Telephone Encounter (Signed)
Pt calling regarding refill for [place] [phone] idf any problems

## 2015-12-07 ENCOUNTER — Telehealth: Payer: Self-pay | Admitting: Interventional Cardiology

## 2015-12-07 MED ORDER — NITROGLYCERIN 0.4 MG SL SUBL: 0.4000 mg | SUBLINGUAL_TABLET | SUBLINGUAL | 12 refills | Status: DC | PRN

## 2015-12-07 NOTE — Telephone Encounter (Signed)
°*  STAT* If patient is at the pharmacy, call can be transferred to refill team.   1. Which medications need to be refilled? (please list name of each medication and dose if known) NitroGlycerin (lost other prescription) 2. Which pharmacy/location (including street and city if local pharmacy) is medication to be sent to?[facility] [address]   3. Do they need a 30 day or 90 day supply? [place]

## 2015-12-07 NOTE — Telephone Encounter (Signed)
Refill sent to the pharmacy electronically.  

## 2015-12-09 DIAGNOSIS — M545 Low back pain: Secondary | ICD-10-CM | POA: Diagnosis not present

## 2015-12-09 DIAGNOSIS — M5417 Radiculopathy, lumbosacral region: Secondary | ICD-10-CM | POA: Diagnosis not present

## 2015-12-09 DIAGNOSIS — M5136 Other intervertebral disc degeneration, lumbar region: Secondary | ICD-10-CM | POA: Diagnosis not present

## 2015-12-29 DIAGNOSIS — I63212 Cerebral infarction due to unspecified occlusion or stenosis of left vertebral arteries: Secondary | ICD-10-CM | POA: Diagnosis not present

## 2015-12-29 DIAGNOSIS — Z7982 Long term (current) use of aspirin: Secondary | ICD-10-CM | POA: Diagnosis not present

## 2015-12-29 DIAGNOSIS — E784 Other hyperlipidemia: Secondary | ICD-10-CM | POA: Diagnosis not present

## 2015-12-29 DIAGNOSIS — E119 Type 2 diabetes mellitus without complications: Secondary | ICD-10-CM | POA: Diagnosis not present

## 2015-12-29 DIAGNOSIS — I2510 Atherosclerotic heart disease of native coronary artery without angina pectoris: Secondary | ICD-10-CM | POA: Diagnosis not present

## 2015-12-29 DIAGNOSIS — Z79899 Other long term (current) drug therapy: Secondary | ICD-10-CM | POA: Diagnosis not present

## 2015-12-29 DIAGNOSIS — Z794 Long term (current) use of insulin: Secondary | ICD-10-CM | POA: Diagnosis not present

## 2015-12-29 DIAGNOSIS — I10 Essential (primary) hypertension: Secondary | ICD-10-CM | POA: Diagnosis not present

## 2015-12-29 DIAGNOSIS — Z86711 Personal history of pulmonary embolism: Secondary | ICD-10-CM | POA: Diagnosis not present

## 2015-12-29 DIAGNOSIS — Z7901 Long term (current) use of anticoagulants: Secondary | ICD-10-CM | POA: Diagnosis not present

## 2015-12-29 DIAGNOSIS — E785 Hyperlipidemia, unspecified: Secondary | ICD-10-CM | POA: Diagnosis not present

## 2015-12-29 DIAGNOSIS — G4733 Obstructive sleep apnea (adult) (pediatric): Secondary | ICD-10-CM | POA: Diagnosis not present

## 2015-12-29 DIAGNOSIS — Z95828 Presence of other vascular implants and grafts: Secondary | ICD-10-CM | POA: Diagnosis not present

## 2015-12-29 DIAGNOSIS — I63219 Cerebral infarction due to unspecified occlusion or stenosis of unspecified vertebral arteries: Secondary | ICD-10-CM | POA: Diagnosis not present

## 2015-12-31 DIAGNOSIS — M542 Cervicalgia: Secondary | ICD-10-CM | POA: Diagnosis not present

## 2015-12-31 DIAGNOSIS — G894 Chronic pain syndrome: Secondary | ICD-10-CM | POA: Diagnosis not present

## 2015-12-31 DIAGNOSIS — Z79899 Other long term (current) drug therapy: Secondary | ICD-10-CM | POA: Diagnosis not present

## 2015-12-31 DIAGNOSIS — M545 Low back pain: Secondary | ICD-10-CM | POA: Diagnosis not present

## 2015-12-31 DIAGNOSIS — M79606 Pain in leg, unspecified: Secondary | ICD-10-CM | POA: Diagnosis not present

## 2015-12-31 DIAGNOSIS — Z79891 Long term (current) use of opiate analgesic: Secondary | ICD-10-CM | POA: Diagnosis not present

## 2016-01-14 DIAGNOSIS — N419 Inflammatory disease of prostate, unspecified: Secondary | ICD-10-CM | POA: Diagnosis not present

## 2016-01-14 DIAGNOSIS — R413 Other amnesia: Secondary | ICD-10-CM | POA: Diagnosis not present

## 2016-01-25 DIAGNOSIS — E1051 Type 1 diabetes mellitus with diabetic peripheral angiopathy without gangrene: Secondary | ICD-10-CM | POA: Diagnosis not present

## 2016-01-25 DIAGNOSIS — L603 Nail dystrophy: Secondary | ICD-10-CM | POA: Diagnosis not present

## 2016-01-25 DIAGNOSIS — I739 Peripheral vascular disease, unspecified: Secondary | ICD-10-CM | POA: Diagnosis not present

## 2016-02-01 DIAGNOSIS — R05 Cough: Secondary | ICD-10-CM | POA: Diagnosis not present

## 2016-02-15 DIAGNOSIS — L97909 Non-pressure chronic ulcer of unspecified part of unspecified lower leg with unspecified severity: Secondary | ICD-10-CM | POA: Diagnosis not present

## 2016-02-15 DIAGNOSIS — J069 Acute upper respiratory infection, unspecified: Secondary | ICD-10-CM | POA: Diagnosis not present

## 2016-02-21 ENCOUNTER — Encounter: Payer: Self-pay | Admitting: Neurology

## 2016-02-21 ENCOUNTER — Ambulatory Visit (INDEPENDENT_AMBULATORY_CARE_PROVIDER_SITE_OTHER): Payer: Medicare Other | Admitting: Neurology

## 2016-02-21 VITALS — BP 120/52 | HR 76 | Resp 16 | Ht 65.0 in | Wt 180.0 lb

## 2016-02-21 DIAGNOSIS — G3184 Mild cognitive impairment, so stated: Secondary | ICD-10-CM

## 2016-02-21 DIAGNOSIS — I63113 Cerebral infarction due to embolism of bilateral vertebral arteries: Secondary | ICD-10-CM | POA: Diagnosis not present

## 2016-02-21 DIAGNOSIS — I63012 Cerebral infarction due to thrombosis of left vertebral artery: Secondary | ICD-10-CM

## 2016-02-21 DIAGNOSIS — Z87898 Personal history of other specified conditions: Secondary | ICD-10-CM

## 2016-02-21 DIAGNOSIS — Z9114 Patient's other noncompliance with medication regimen: Secondary | ICD-10-CM | POA: Diagnosis not present

## 2016-02-21 DIAGNOSIS — G4733 Obstructive sleep apnea (adult) (pediatric): Secondary | ICD-10-CM

## 2016-02-21 MED ORDER — MEMANTINE HCL 28 X 5 MG & 21 X 10 MG PO TABS
ORAL_TABLET | ORAL | 12 refills | Status: DC
Start: 2016-02-21 — End: 2016-02-24

## 2016-02-21 NOTE — Addendum Note (Signed)
Addended by: Larey Seat on: 02/21/2016 03:06 PM   Modules accepted: Orders

## 2016-02-21 NOTE — Patient Instructions (Signed)
Cerebral Arteriosclerosis Introduction Cerebral arteriosclerosis is a condition that affects the arteries in your brain. When you have cerebral arteriosclerosis, these arteries become thicker, harder, and narrower than normal. Once this damage starts, a sticky substance (plaque) can build up inside your arteries and block blood flow. This reduces the normal blood flow to your brain. Blood carries oxygen to your brain. Since blood flow to your brain is reduced, your brain may not get the oxygen it needs. This process may start early in life and build up over time. Blood clots or plaques that rupture and break off may completely block blood flow and cause sudden and serious damage. Cerebral arteriosclerosis can cause serious problems such as stroke or dementia. What increases the risk? Risk factors for developing cerebral arteriosclerosis include:  Being male.  Older age. The likelihood of this disorder increases with age.  Being Asian, African American, or Hispanic.  Having a family history of certain conditions, such as heart disease or stroke.  Having heart disease or a history of stroke. Lifestyle factors that can increase your risk of developing cerebral arteriosclerosis include:  Smoking or being exposed to secondhand smoke.  Having diabetes.  Being overweight.  Not getting enough exercise.  Having high blood pressure (hypertension).  Having high cholesterol.  Having a diet high in fat. What are the signs or symptoms? The most common symptoms of cerebral arteriosclerosis include:  Numbness or weakness in your arms or legs.  Speech problems.  Problems swallowing.  Vision problems.  Confusion.  Irritability.  Personality changes, such as loss of interest, irritability, or confusion (vascular dementia).  Headache or facial pain. How is this diagnosed? Your health care provider may diagnose cerebral arteriosclerosis based on your symptoms and a physical exam. You may  also have imaging studiesof your brain to confirm the diagnosis. These may include:  An imaging test using sound waves (ultrasound).  An imaging test after getting an injection of dye (angiogram). How is this treated? Treatment may include:  Medicine to control conditions that put you at risk of developing cerebral arteriosclerosis. These conditions may include:  High blood pressure.  High cholesterol.  Diabetes.  Taking medicine to thin your blood and prevent stroke.  Surgery. This may be:  Intracranial stent placement. This is a procedure to locate a blocked artery in the brain, widen the artery, and keep the artery open.  Surgery to bypass a blocked artery with a healthy artery taken from another part of the body. Follow these instructions at home:  Take medicine only as directed by your health care provider.  Do not use any tobacco products, including cigarettes, chewing tobacco, or electronic cigarettes. If you need help quitting, ask your health care provider.  Eat a low-fat, low-cholesterol, and low-sodium diet as directed by your health care provider.  Follow an exercise program approved by your health care provider.  Maintain a healthy weight. Lose weight if necessary.  Work with your health care provider to manage other health conditions, such as hypertension or diabetes.  Keep all follow-up visits as directed by your health care provider. This is important. Contact a health care provider if:  You have frequent headaches.  You have dizziness.  You or someone else notices changes in your personality.  You have facial pain.  You have periods of confusion. Get help right away if:  You have a very bad headache.  You have weakness or numbness in your arms or legs.  You have droopiness of your face.  You   have difficulty speaking.  You have difficulty swallowing.  You have trouble understanding what other people are saying.  You have sudden  confusion.  You have sudden vision problems.  You have a seizure or loss of consciousness. Any of these symptoms may represent a serious problem that is an emergency. Do not wait to see if the symptoms will go away. Get medical help right away. Call your local emergency services (911 in [place] Do not drive yourself to the hospital.  This information is not intended to replace advice given to you by your health care provider. Make sure you discuss any questions you have with your health care provider. Document Released: 12/16/2001 Document Revised: 06/03/2015 Document Reviewed: 02/13/2013  2017 Elsevier  

## 2016-02-21 NOTE — Progress Notes (Signed)
PATIENT: Steve Brown DOB: [birth year]  REASON FOR VISIT: This patient has been established as a sleep patient in our clinic since 2013, has a history of multi-infarct, hypertension, diabetes and was evaluated for neuropathy in 2015, was followed for CPAP compliance until last May when he was no longer compliant due to repeated sinus infections, hospitalizations etc. He stated today that he has not been able to reinitiate CPAP use since March 2017.   his primary care physician, Dr. Harrington Challenger, referred now for memory concerns, in a patient with known multi-infarct history but with a sliding result on his minimal mental status examinations. A note from January 5 of this year states that the patient had scored 26 out of 29 points in a high school educated African-American gentleman age 61. The patient had suffered multiple strokes and ongoing TIA events, image studies were quoted below. I used today a Montral cognitive assessment and he scored 24 out of 30 points 2 points less  Expected. The details of the exam results are copied to the physical exam note. He did particularly well with the trail making test but had difficulties with clock drawing and copying an image. His short-term memory was excellent 5 out of 5 words, he had trouble with serial 7- but he had a lifelong difficulties with mathematics all through HS.   11-18-2015 CD  Steve Brown is a 76 year old male with a history of obstructive sleep apnea on CPAP. He returns today for follow-up. His last download from May 2017i ndicated that he uses machine 27 out of 30 days for compliance of 90%. He stopped using his CPAP at the end of March and again in June 2017, had felt sick for while, finally underwent gallbladder surgery after month of abdominal pain. The gallbladder inflammation did cause some sleep interruption and some discomfort at night, He was tossing and turning and also rubbing alcohol on his belly to relieve the symptoms. Now since the  surgery he has regained the ability to sleep more soundly and is wanting to return to CPAP use. His daughter suffered a stroke earlier this month, he is worried. For the months of June he has a compliance of 22 out of 30 days 75% for 6 hours and 13 minutes on average use the 9 cm setting with an AHI of 0.9. He also reports that he has a hard time sleeping on his back. Therefore when he sleeps on his sides occasionally the pillow will push the mask off his face causing a leak.  He reports that he does try to exercise daily. He has continued on Xarelto and aspirin for stroke prevention. His primary care is managing his hypertension and hyperlipidemia. He denies any additional strokelike symptoms.   HISTORY 11/18/14: Steve Brown is a 76 year old male with a history of obstructive sleep apnea on CPAP. He returns today for a follow-up. The patient CPAP download indicates that he uses machine 21 out of 30 days for compliance of 70%. He only uses machine 20 out of 30 days for compliance of 67%. On average he uses his machine 6 hours and 2 minutes. His residual AHI is 1.6 on 9 cm of water with EPR 1. He has a leak and the 95th percentile at 26.5 L/m. The patient was recently in the hospital for a TIA event. The patient experienced weakness in the left arm. He called EMS and the weakness resolved prior to their arrival. He was admitted to the hospital CT scan and MRI was negative.  Carotid Dopplers were unremarkable. The patient had a 2-D echo in 2015- results are in [place]. The patient remains on Xarelto after having a PE. He is also on aspirin The patient's blood pressure remains under good control. His primary care manages his hypertension hyperlipidemia and diabetes. He denies any additional strokelike symptoms. He is starting physical therapy this week. He returns today for an evaluation.  UPDATE 05/18/14 Steve Brown, who was diagnosed with severe sleep apnea at an AHI of 65.9, has made efforts to increase his CPAP  compliance. CPAP was initiated to reduce his secondary CVA risk.  He has used the machine over the last 30 days still only on 18 days, and all these days over 4 hours his compliance is 53% his average user time is 6 hours and 18 minutes his EPR is 1 cm and a set pressure 9 cm water unchanged from the last year. His residual AHI was 0.9 documenting that the setting is effective. Fatigue severity score was only 18 and sleepiness score 6 points again I at contribute to the lower sleepiness score also to his use of CPAP. He stated that he had some sinusitis problems that kept him from using CPAP and has just seen Dr. Ernesto Rutherford who prescribed no antibiotics after other measures had failed. He is reluctant to use an anti-decongestant which I would also not recommend because it can spike his blood pressure. He has Flonase which Dr. Harrington Challenger his primary care physician has prescribed for him. I will see him again in one year for CPAP compliance visit but urged him to try to use the machine every night or at least if he has a medical reason not to use it to discontinue the use for 3 or 4 days until he recovers and then start again. He has also gained some weight which is a risk factor for obstructive sleep apnea alongside his retrognathia. He is now 185 pounds.   HISTORY HISTORY 08/14/13 (CD): Steve Brown is meanwhile 76 years old and has been staying active. He has controlled his weight he is physically active, he also has undergone a complete course of physical therapy since last visit. After out last visit, we had obtained an MRI of the brain without contrast and compared to the study from earlier the same year : there was no change in comparison to the May CT on this MRI dated 07-04-13 . The patient has few scattered small vessel injuries and some lacunar infarcts.  The main risk factor for these as hypertension and diabetes- Occupational therapy saw him for left arm pain. Additional risk factor is the patient's diabetes  mellitus condition. I referred him for occupational therapy to get further insight in what may have caused his arm pain.  He noted that he could left pounds with the left 3 pounds with her right hand now there is still a significant difference .  resulting in the conduction study and EMG study which was then performed on 07-09-13, normal study no evidence of large fiber neuropathy in the left upper extremity deltoid, biceps, triceps and flexor carpi radialis as well as interosseus Muscles; no abnormal EMG activity.  He underwent a sleep study at [facility] : results from 08-07-13 reviewed today :  Steve Brown was diagnosed with severe sleep apnea at an AHI of 65.9. Lowest point of oxygen saturation was 70%. He was titrated to CPAP at 9 cm water he slept 92.7 minutes at that pressure of which 23 minutes of REM sleep. The  AHI was now 0.6 per hour. The previously very fragmented sleep architecture became now essentially normal. The technologist used a PIC all equal nasal mask. The patient is asked to return in about 50 days for a followup with the CPAP machine.  I also reviewed his long medication list : patient has been on Xeralto for chronic anticoagulation.  REVIEW OF SYSTEMS: Out of a complete 14 system review of symptoms, the patient complains only of the following symptoms, and all other reviewed systems are negative.  "i cannot recall the Sunday sermon. Non compliant with CPAP- 11 month on and off.   ALLERGIES: Allergies  Allergen Reactions  . Other Other (See Comments)    Per patient- cardiac cath dye-  "woke up during procedure hysterical."  . Phenergan [Promethazine] Other (See Comments)    Mood changes     HOME MEDICATIONS: Outpatient Medications Prior to Visit  Medication Sig Dispense Refill  . amoxicillin-clavulanate (AUGMENTIN XR) 1000-62.5 MG 12 hr tablet Take 2 tablets by mouth 2 (two) times daily. 28 tablet 0  . Ascorbic Acid (VITAMIN C PO) Take 1 tablet by mouth daily.      Marland Kitchen aspirin EC 81 MG tablet Take 1 tablet (81 mg total) by mouth daily.    . BD PEN NEEDLE NANO U/F 32G X 4 MM MISC by Does not apply route.     . bisacodyl (DULCOLAX) 5 MG EC tablet Take 5 mg by mouth daily as needed for moderate constipation.    . chlorhexidine (PERIDEX) 0.12 % solution Use as directed 15 mLs in the mouth or throat 2 (two) times daily.     . cholecalciferol (VITAMIN D) 1000 UNITS tablet Take 1,000 Units by mouth daily.    . diclofenac sodium (VOLTAREN) 1 % GEL Apply 2 g topically 3 (three) times daily as needed (pain).    Marland Kitchen dutasteride (AVODART) 0.5 MG capsule Take 0.5 mg by mouth daily.    . ferrous sulfate 325 (65 FE) MG tablet Take 325 mg by mouth daily with breakfast.    . fluticasone (FLONASE) 50 MCG/ACT nasal spray Place 2 sprays into both nostrils daily as needed for allergies.     . furosemide (LASIX) 20 MG tablet Take 20 mg by mouth daily as needed (swelling).     Marland Kitchen HUMALOG MIX 75/25 KWIKPEN (75-25) 100 UNIT/ML Kwikpen Inject 18-20 Units into the skin 2 (two) times daily. 18 units in evening, and 20 units in the morning.  1  . lactobacillus acidophilus & bulgar (LACTINEX) chewable tablet Chew 1 tablet by mouth daily.   0  . metFORMIN (GLUCOPHAGE) 500 MG tablet Take 500 mg by mouth 2 (two) times daily with a meal.    . Multiple Vitamin (MULTIVITAMIN WITH MINERALS) TABS Take 1 tablet by mouth every evening.     . nitroGLYCERIN (NITROSTAT) 0.4 MG SL tablet Place 1 tablet (0.4 mg total) under the tongue every 5 (five) minutes as needed for chest pain. 25 tablet 12  . nystatin (MYCOSTATIN) 100000 UNIT/ML suspension Take 5 mLs by mouth 2 (two) times daily as needed (for oral thrush.).   3  . pantoprazole (PROTONIX) 40 MG tablet Take 40 mg by mouth daily.    . pregabalin (LYRICA) 300 MG capsule Take 1 capsule (300 mg total) by mouth 2 (two) times daily. 180 capsule 3  . ramipril (ALTACE) 2.5 MG capsule Take 2.5 mg by mouth daily.  1  . rivaroxaban (XARELTO) 20 MG TABS tablet  Take 20 mg by mouth  daily.     . rosuvastatin (CRESTOR) 10 MG tablet Take 10 mg by mouth every evening.     . valACYclovir (VALTREX) 500 MG tablet Take 500 mg by mouth daily.    Marland Kitchen oxyCODONE-acetaminophen (PERCOCET/ROXICET) 5-325 MG tablet Take 1-2 tablets by mouth every 4 (four) hours as needed for moderate pain. 30 tablet 0   No facility-administered medications prior to visit.     PAST MEDICAL HISTORY: Past Medical History:  Diagnosis Date  . Benign localized hyperplasia of prostate without urinary obstruction and other lower urinary tract symptoms (LUTS)   . Blood dyscrasia   . Coronary artery disease   . Degeneration of cervical intervertebral disc   . Depression   . Diabetes mellitus   . Dyspnea    with exertion  . GERD (gastroesophageal reflux disease)   . History of TIAs   . Hypersomnia with sleep apnea, unspecified   . Hypertension   . Lumbago   . Neuropathy ([place])   . Other and unspecified hyperlipidemia   . Pain in joint, multiple sites   . PE (pulmonary thromboembolism) ([place])   . Personal history of unspecified circulatory disease   . Pneumonia 2005   2010  . Sleep apnea   . Stroke [facility]) 2002, 2003   both sided weakness  . Trigger finger of both hands 11-17-13  . Type II or unspecified type diabetes mellitus without mention of complication, not stated as uncontrolled   . Unspecified cardiovascular disease   . Unspecified essential hypertension   . Unspecified fall   . Unsteady gait 05/19/2013   X 24 hours - improving. Hx prior CVA/TIAs; multiple risk factors; on Xarelto    PAST SURGICAL HISTORY: Past Surgical History:  Procedure Laterality Date  . CARDIAC CATHETERIZATION    . CARPAL TUNNEL RELEASE     bilateral  . CERVICAL DISC SURGERY    . CHOLECYSTECTOMY N/A 11/02/2015   Procedure: LAPAROSCOPIC CHOLECYSTECTOMY WITH INTRAOPERATIVE CHOLANGIOGRAM;  Surgeon: Donnie Mesa, MD;  Location: [place];  Service: General;  Laterality: N/A;  . COLONOSCOPY    .  CORONARY ANGIOPLASTY    . CORONARY STENT PLACEMENT  Feb. 2010  . ELBOW SURGERY     right elbow - nerve release  . EYE SURGERY Bilateral    cataract  . HERNIA REPAIR     umbicical  . LEFT HEART CATHETERIZATION WITH CORONARY ANGIOGRAM N/A 04/03/2013   Procedure: LEFT HEART CATHETERIZATION WITH CORONARY ANGIOGRAM;  Surgeon: Jettie Booze, MD;  Location: [facility] CATH LAB;  Service: Cardiovascular;  Laterality: N/A;  . lungs     "fluid pumped off lungs"    FAMILY HISTORY: Family History  Problem Relation Age of Onset  . Aneurysm Mother   . Cancer Father   . Heart attack Neg Hx     SOCIAL HISTORY: Social History   Social History  . Marital status: Divorced    Spouse name: N/A  . Number of children: 4  . Years of education: 12   Occupational History  .  Retired    retired   Social History Main Topics  . Smoking status: Never Smoker  . Smokeless tobacco: Never Used  . Alcohol use No  . Drug use: No  . Sexual activity: Not on file   Other Topics Concern  . Not on file   Social History Narrative   Patient lives at home alone and he is single.  Patient is retired.    Caffeine - one cups daily.   Right  handed.   Patient has a high school education.   Patient has four adult children.      PHYSICAL EXAM  Vitals:   02/21/16 1359  BP: (!) 120/52  Pulse: 76  Resp: 16  Weight: 180 lb (81.6 kg)  Height: 5\' 5"  (1.651 m)   Body mass index is 29.95 kg/m.  Generalized: Well developed, in no acute distress   Neurological examination  Mentation: Alert oriented to time, place, history taking.  Montreal Cognitive Assessment  02/21/2016  Visuospatial/ Executive (0/5) 3  Naming (0/3) 3  Attention: Read list of digits (0/2) 2  Attention: Read list of letters (0/1) 0  Attention: Serial 7 subtraction starting at 100 (0/3) 0  Language: Repeat phrase (0/2) 2  Language : Fluency (0/1) 1  Abstraction (0/2) 2  Delayed Recall (0/5) 5  Orientation (0/6) 5  Total 23    Adjusted Score (based on education) 24    Follows all commands speech and language fluent Cranial nerve : Taste and smell preserved , Pupils were equal round reactive to light. Extraocular movements were full, visual field were full on confrontational test. Facial sensation and strength were normal. Uvula tongue midline. Mallampati 3+ Head turning and shoulder shrug  were normal and symmetric. Motor:  5 /5 in 4 extremities,  symmetric motor tone is noted throughout.  Sensory: Sensory testing is intact to soft touch on all 4 extremities. No evidence of extinction is noted.  Coordination: finger-nose-finger intact  bilaterally.  Gait and station: Gait is normal. Tandem gait is normal. Romberg is negative. No drift is seen.  Reflexes: Deep tendon reflexes are symmetric  bilaterally.   DIAGNOSTIC DATA (LABS, IMAGING, TESTING) - I reviewed patient records, labs, notes, testing and imaging myself where available.  [place] stroke clinic note from 31- 20 2017- Dr Caroline Sauger,   Lab Results  Component Value Date   WBC 4.8 10/29/2015   HGB 13.4 10/29/2015   HCT 40.0 10/29/2015   MCV 90.3 10/29/2015   PLT 246 10/29/2015        ASSESSMENT AND PLAN 76 y.o. year old male who has been followed for CPAP compliance in the sleep clinic of the [place] office,  has now been non compliant with CPAP since March 2017, almost a year. I cannot supply new parts for CPAP under these circumstance - non compliance.   1. Obstructive sleep apnea non compliant with CPAP, untreated. 2.  Mr. Mould scored 24 out of 30 points and a Montral cognitive assessment, placing him at the mild cognitive impairment. He does have some amnestic events, describes how he cannot remember as a sermon after church visit, he does not have orientation difficulties such as date time place. And remarkably he will could recall 5 out of 5 words. This is not typical for short-term memory loss either. He did have visual spatial difficulties  with clock drawing and copying a cube. He is very selective and partial difficulties may well be related to his previous strokes. He also is chronically anticoagulated to prevent strokes I did think we need to repeat an imaging study of the brain - I suspected that this is a vascular or infarct related memory loss rather than Alzheimer's disease.   It was after review of his notes from Dr Caroline Sauger at [facility] that I noticed that he had just been reevaluated for new stroke at the end of last year including imaging studies. I do not need to repeat any. His cognitive deficits are  explained  with a multi-infarct dementia.    Multi infarct dementia- stroke prevention is main goal, I will ask him to keep his follow ups with Dr. Caroline Sauger at [facility]. He remains non compliant , may be due to his memory deficits. He is at higher risk of embolic strokes as long as OSA is untreated. He stated he wants to restart CPAP therapy and I ordered a SPLIT , follow up with sleep clinic NP.     Ceana Fiala, MD  02/21/2016, 2:33 PM [facility] [address] [place], [place] [address] [phone]

## 2016-02-24 ENCOUNTER — Telehealth: Payer: Self-pay | Admitting: Neurology

## 2016-02-24 DIAGNOSIS — I63012 Cerebral infarction due to thrombosis of left vertebral artery: Secondary | ICD-10-CM

## 2016-02-24 DIAGNOSIS — G4733 Obstructive sleep apnea (adult) (pediatric): Secondary | ICD-10-CM

## 2016-02-24 DIAGNOSIS — G609 Hereditary and idiopathic neuropathy, unspecified: Secondary | ICD-10-CM | POA: Diagnosis not present

## 2016-02-24 DIAGNOSIS — Z9114 Patient's other noncompliance with medication regimen: Secondary | ICD-10-CM

## 2016-02-24 DIAGNOSIS — G3184 Mild cognitive impairment, so stated: Secondary | ICD-10-CM

## 2016-02-24 DIAGNOSIS — I63113 Cerebral infarction due to embolism of bilateral vertebral arteries: Secondary | ICD-10-CM

## 2016-02-24 DIAGNOSIS — E7800 Pure hypercholesterolemia, unspecified: Secondary | ICD-10-CM | POA: Diagnosis not present

## 2016-02-24 DIAGNOSIS — I10 Essential (primary) hypertension: Secondary | ICD-10-CM | POA: Diagnosis not present

## 2016-02-24 DIAGNOSIS — E1165 Type 2 diabetes mellitus with hyperglycemia: Secondary | ICD-10-CM | POA: Diagnosis not present

## 2016-02-24 DIAGNOSIS — Z87898 Personal history of other specified conditions: Secondary | ICD-10-CM

## 2016-02-24 MED ORDER — MEMANTINE HCL 28 X 5 MG & 21 X 10 MG PO TABS: ORAL_TABLET | ORAL | 12 refills | Status: DC

## 2016-02-24 NOTE — Telephone Encounter (Signed)
I have reprinted and faxed to correct pharmacy

## 2016-02-24 NOTE — Telephone Encounter (Signed)
Pt called said refill for memantine (NAMENDA TITRATION PAK) tablet pack should be sent to [facility]

## 2016-03-08 ENCOUNTER — Encounter (HOSPITAL_BASED_OUTPATIENT_CLINIC_OR_DEPARTMENT_OTHER): Payer: Medicare Other | Attending: Nurse Practitioner

## 2016-03-08 DIAGNOSIS — Z86711 Personal history of pulmonary embolism: Secondary | ICD-10-CM | POA: Diagnosis not present

## 2016-03-08 DIAGNOSIS — Z794 Long term (current) use of insulin: Secondary | ICD-10-CM | POA: Insufficient documentation

## 2016-03-08 DIAGNOSIS — I10 Essential (primary) hypertension: Secondary | ICD-10-CM | POA: Diagnosis not present

## 2016-03-08 DIAGNOSIS — Z8673 Personal history of transient ischemic attack (TIA), and cerebral infarction without residual deficits: Secondary | ICD-10-CM | POA: Insufficient documentation

## 2016-03-08 DIAGNOSIS — Z872 Personal history of diseases of the skin and subcutaneous tissue: Secondary | ICD-10-CM | POA: Insufficient documentation

## 2016-03-08 DIAGNOSIS — Z955 Presence of coronary angioplasty implant and graft: Secondary | ICD-10-CM | POA: Insufficient documentation

## 2016-03-08 DIAGNOSIS — Z86718 Personal history of other venous thrombosis and embolism: Secondary | ICD-10-CM | POA: Diagnosis not present

## 2016-03-08 DIAGNOSIS — G4730 Sleep apnea, unspecified: Secondary | ICD-10-CM | POA: Diagnosis not present

## 2016-03-08 DIAGNOSIS — S81801D Unspecified open wound, right lower leg, subsequent encounter: Secondary | ICD-10-CM | POA: Diagnosis not present

## 2016-03-08 DIAGNOSIS — I2510 Atherosclerotic heart disease of native coronary artery without angina pectoris: Secondary | ICD-10-CM | POA: Diagnosis not present

## 2016-03-08 DIAGNOSIS — E1140 Type 2 diabetes mellitus with diabetic neuropathy, unspecified: Secondary | ICD-10-CM | POA: Insufficient documentation

## 2016-03-24 DIAGNOSIS — M545 Low back pain: Secondary | ICD-10-CM | POA: Diagnosis not present

## 2016-03-24 DIAGNOSIS — Z79899 Other long term (current) drug therapy: Secondary | ICD-10-CM | POA: Diagnosis not present

## 2016-03-24 DIAGNOSIS — G894 Chronic pain syndrome: Secondary | ICD-10-CM | POA: Diagnosis not present

## 2016-03-24 DIAGNOSIS — M542 Cervicalgia: Secondary | ICD-10-CM | POA: Diagnosis not present

## 2016-03-24 DIAGNOSIS — M79606 Pain in leg, unspecified: Secondary | ICD-10-CM | POA: Diagnosis not present

## 2016-03-24 DIAGNOSIS — Z79891 Long term (current) use of opiate analgesic: Secondary | ICD-10-CM | POA: Diagnosis not present

## 2016-03-27 ENCOUNTER — Ambulatory Visit (INDEPENDENT_AMBULATORY_CARE_PROVIDER_SITE_OTHER): Payer: Medicare Other | Admitting: Neurology

## 2016-03-27 ENCOUNTER — Other Ambulatory Visit: Payer: Self-pay | Admitting: Neurology

## 2016-03-27 DIAGNOSIS — I63113 Cerebral infarction due to embolism of bilateral vertebral arteries: Secondary | ICD-10-CM

## 2016-03-27 DIAGNOSIS — Z87898 Personal history of other specified conditions: Secondary | ICD-10-CM

## 2016-03-27 DIAGNOSIS — I63012 Cerebral infarction due to thrombosis of left vertebral artery: Secondary | ICD-10-CM

## 2016-03-27 DIAGNOSIS — G4733 Obstructive sleep apnea (adult) (pediatric): Secondary | ICD-10-CM

## 2016-03-27 DIAGNOSIS — R310 Gross hematuria: Secondary | ICD-10-CM | POA: Diagnosis not present

## 2016-03-27 DIAGNOSIS — G3184 Mild cognitive impairment, so stated: Secondary | ICD-10-CM

## 2016-03-27 DIAGNOSIS — Z91199 Patient's noncompliance with other medical treatment and regimen due to unspecified reason: Secondary | ICD-10-CM

## 2016-03-27 DIAGNOSIS — Z9114 Patient's other noncompliance with medication regimen: Secondary | ICD-10-CM

## 2016-03-27 MED ORDER — MEMANTINE HCL 10 MG PO TABS: 10.0000 mg | ORAL_TABLET | Freq: Two times a day (BID) | ORAL | 3 refills | Status: DC

## 2016-04-03 NOTE — Procedures (Signed)
PATIENT'S NAME:  Steve Brown, Geers DOB:      [birth year]      MR#:    [id number]     DATE OF RECORDING: 03/27/2016 REFERRING M.D.:  Lawerance Cruel, MD Study Performed:  Split-Night Titration Study HISTORY: This patient has been established as a sleep patient in our clinic since 2013, has a history of multi-infarct vascular cerebral disease, hypertension, diabetes, was evaluated for neuropathy in 2015, was followed for CPAP compliance until last May when he was no longer compliant due to repeated sinus infections, hospitalizations etc. He stated today that he has not been able to reinitiate CPAP use since March 2017. The patient had suffered multiple strokes and ongoing TIA events, image studies were quoted below. Montral cognitive assessment scored 24 out of 30 points. CAD, Depression, Diabetes, Dyspnea, GERD, History of TIA, Hypersomnia with sleep apnea, Hypertension, Lumbago, Neuropathy, PE, Pneumonia, Sleep Apnea, Stroke, and Trigger finger of both hands.  The patient's weight 180 pounds with a height of 65 (inches), resulting in a BMI of 30.1 kg/m2. The patient's neck circumference measured 17 inches.  CURRENT MEDICATIONS: Augmentin, Vitamin C, Aspirin, BD Pen, Peridex, Vitamin D, Voltaren, Avodart, FE, Flonase, Lasix, Humalog, Lactinex, Glucophage, Multivitamin, Mycostatin, Protonix, Lyrica, Altace, Xarelto, Crestor, Valtrex, Oxycodone.    PROCEDURE:  This is a multichannel digital polysomnogram utilizing the Somnostar 11.2 system.  Electrodes and sensors were applied and monitored per AASM Specifications.   EEG, EOG, Chin and Limb EMG, were sampled at 200 Hz.  ECG, Snore and Nasal Pressure, Thermal Airflow, Respiratory Effort, CPAP Flow and Pressure, Oximetry was sampled at 50 Hz. Digital video and audio were recorded.      BASELINE STUDY WITHOUT CPAP RESULTS:  Lights Out was at 21:55 and Lights On at 05:35.  Total recording time (TRT) was 162, with a total sleep time (TST) of 129 minutes.    The patient's sleep latency was 17.5 minutes.  REM latency was 106.5 minutes.  The sleep efficiency was 79.6 %.    SLEEP ARCHITECTURE: WASO (Wake after sleep onset) was 2 minutes, Stage N1 was 11.5 minutes, Stage N2 was 96 minutes, Stage N3 was 0 minutes and Stage R (REM sleep) was 21.5 minutes.  The percentages were Stage N1 8.9%, Stage N2 74.4%, Stage N3 0% and Stage R (REM sleep) 16.7%.   RESPIRATORY ANALYSIS:  There were a total of 131 respiratory events:  103 obstructive apneas, 0 central apneas and 0 mixed apneas with a total of 103 apneas and an apnea index (AI) of 47.9. There were 28 hypopneas with a hypopnea index of 13.. The patient also had 0 respiratory event related arousals (RERAs).  Snoring was noted.     The total APNEA/HYPOPNEA INDEX (AHI) was 60.9 /hour and the total RESPIRATORY DISTURBANCE INDEX was 60.9 /hour. 19 events occurred in REM sleep and 38 events in NREM. The REM AHI was 53.0, /hr. versus a non-REM AHI of 62.5 /hr. The patient spent 187 minutes sleep time in the supine position 233 minutes in non-supine. The supine AHI was 71.7 /hour versus a non-supine AHI of 46.2 /hour.  OXYGEN SATURATION & C02:  The wake baseline 02 saturation was 94%, with the lowest being 75%. Time spent below 89% saturation equaled 79 minutes.  PERIODIC LIMB MOVEMENTS:    The patient had a total of 0 Periodic Limb Movements.  The arousals were noted as: 20 were spontaneous, 0 were associated with PLMs, and 29 were associated with respiratory events.  Audio and video analysis  did not show any abnormal or unusual movements, behaviors, phonations or vocalizations The patient took one bathroom break. Snoring was noted EKG was in keeping with normal sinus rhythm (NSR)  TITRATION STUDY WITH CPAP RESULTS:   CPAP was initiated at  5 cmH20 with heated humidity per AASM split night standards and pressure was advanced to 13 cmH20 because of hypopneas, apneas and desaturations.  At a PAP pressure of 13  cmH20, a reduction of the AHI to 0.0 /hour was seen, and the nadir rose to 93% SpO2, sleep efficiency was 98.4 %    Total recording time (TRT) was 298 minutes, with a total sleep time (TST) of 291 minutes. The patient's sleep latency was 1 minutes. REM latency was 93.5 minutes.  The sleep efficiency was 97.7 %.    SLEEP ARCHITECTURE: Wake after sleep was 5.5 minutes, Stage N1 11 minutes, Stage N2 168 minutes, Stage N3 36.5 minutes and Stage R (REM sleep) 75.5 minutes. The percentages were: Stage N1 3.8%, Stage N2 57.7%, Stage N3 12.5% and Stage R (REM sleep) 25.9%.  The arousals were noted as: 18 were spontaneous, 0 were associated with PLMs, and  6 were associated with respiratory events.  RESPIRATORY ANALYSIS:  There were 22 respiratory events: 6 obstructive apneas, 2 central apneas and 14 hypopneas with 0 respiratory event related arousals (RERAs).    The total APNEA/HYPOPNEA INDEX  (AHI) was 4.5 /hr. and the total RESPIRATORY DISTURBANCE INDEX was 4.5 /hr.  3 events occurred in REM sleep and 19 events in NREM. The REM AHI was 2.4 /hr. versus a non-REM AHI of 5.3 /hr. REM sleep was achieved on a pressure of 9 cm water pressure (AHI was 6.7.)  The patient spent 39% of total sleep time in the supine position. The supine AHI was 10.7 /hour, versus a non-supine AHI of 0.7/hour.  OXYGEN SATURATION & C02:  The wake baseline 02 saturation was 92%, with the lowest being 82%. Time spent below 89% saturation equaled 8 minutes.  PERIODIC LIMB MOVEMENTS:   The patient had a total of 0 Periodic Limb Movements.    POLYSOMNOGRAPHY IMPRESSION :   1. Severe Obstructive Sleep Apnea(OSA) at AHI of 60.9/hr. and supine AHI of 71.7 /hr.  2. Loud Snoring 3. CPAP was initiated at 5 cm water and titrated to 13 cm water with complete resolution of AHI.  4.  Mostly sinus rhythm with some PVCs.     RECOMMENDATIONS:  1. Advice to start CPAP at 13 cmH2O with 3 cm EPR, using a medium sized Fisher Paykel Simplus mask.    2. Compliance to PAP therapy should be emphasized.  Compliance, AHI and air leak information to be downloaded for objective assessment at 30 days, 180 days and annually thereafter.   3. Further information regarding OSA may be obtained from [employer] (www.sleepfoundation.org) or American Sleep Apnea Association (www.sleepapnea.org). 4. Avoid caffeine-containing beverages and chocolate. 5. A follow up appointment will be scheduled in the Sleep Clinic at [facility].      I certify that I have reviewed the entire raw data recording prior to the issuance of this report in accordance with the Standards of Accreditation of the American Academy of Sleep Medicine (AASM)      Larey Seat, M.D.  04-03-2016  Diplomat, American Board of Psychiatry and Neurology  Diplomat, [place] of Sleep Medicine Medical Director, [place] [facility]

## 2016-04-03 NOTE — Addendum Note (Signed)
Addended by: Larey Seat on: 04/03/2016 05:37 PM   Modules accepted: Orders

## 2016-04-04 ENCOUNTER — Telehealth: Payer: Self-pay

## 2016-04-04 NOTE — Telephone Encounter (Signed)
-----   Message from Larey Seat, MD sent at 04/03/2016  5:37 PM EDT ----- POLYSOMNOGRAPHY IMPRESSION :   1. Severe Obstructive Sleep Apnea (OSA) at AHI of 60.9/hr. and supine AHI of 71.7 /hr.  2. Loud Snoring 3. CPAP was initiated at 5 cm water and titrated to 13 cm water with complete resolution of AHI.  4.  Mostly sinus rhythm with some PVCs.     RECOMMENDATIONS:  Advice to start CPAP at 13 cmH2O with 3 cm EPR, using a medium sized Fisher Paykel Simplus mask.   1. Compliance to PAP therapy should be emphasized.  Compliance, AHI and air leak information to be downloaded for objective assessment at 30 days, 180 days and annually thereafter.   2. Further information regarding OSA may be obtained from [employer] (www.sleepfoundation.org) or American Sleep Apnea Association (www.sleepapnea.org). 3. Avoid caffeine-containing beverages and chocolate. 4. A follow up appointment will be scheduled in the Sleep Clinic at [facility].      I certify that I have reviewed the entire raw data recording prior to the issuance of this report in accordance with the Standards of Accreditation of the [place] [employer] (AASM)     Larey Seat, M.D.  04-03-2016

## 2016-04-04 NOTE — Telephone Encounter (Signed)
I called pt to discuss his sleep study results. No answer, left a message asking him to call me back. 

## 2016-04-06 NOTE — Telephone Encounter (Signed)
I called pt again to discuss his sleep study results. Left a message asking him to call me back.

## 2016-04-11 ENCOUNTER — Telehealth: Payer: Self-pay | Admitting: Neurology

## 2016-04-11 NOTE — Telephone Encounter (Signed)
I called pt, see other telephone note from 04/11/2016.

## 2016-04-11 NOTE — Telephone Encounter (Signed)
I called pt. I advised him that his sleep study showed severe osa with loud snoring. EKG showed mainly NSR with some PVCs. CPAP was successful in treating pt's osa. Dr. Brett Fairy recommends that pt start a cpap at home. Pt says that he has an old cpap and uses [facility]. Will send the order to [facility]. I advised pt to avoid caffeine containing beverages and chocolate.  A follow up appt was scheduled for 08/03/2016 at 2:30pm. Pt verbalized understanding of results. Pt had no questions at this time but was encouraged to call back if questions arise.

## 2016-04-11 NOTE — Telephone Encounter (Signed)
Patient called wanting results from sleep study

## 2016-04-18 DIAGNOSIS — L603 Nail dystrophy: Secondary | ICD-10-CM | POA: Diagnosis not present

## 2016-04-18 DIAGNOSIS — E1051 Type 1 diabetes mellitus with diabetic peripheral angiopathy without gangrene: Secondary | ICD-10-CM | POA: Diagnosis not present

## 2016-04-18 DIAGNOSIS — I739 Peripheral vascular disease, unspecified: Secondary | ICD-10-CM | POA: Diagnosis not present

## 2016-04-25 ENCOUNTER — Observation Stay (HOSPITAL_BASED_OUTPATIENT_CLINIC_OR_DEPARTMENT_OTHER)
Admission: EM | Admit: 2016-04-25 | Discharge: 2016-04-27 | Disposition: A | Discharge status: Home / Self Care | Payer: Medicare Other | Attending: Internal Medicine | Admitting: Internal Medicine

## 2016-04-25 ENCOUNTER — Encounter (HOSPITAL_BASED_OUTPATIENT_CLINIC_OR_DEPARTMENT_OTHER): Payer: Self-pay

## 2016-04-25 ENCOUNTER — Emergency Department (HOSPITAL_BASED_OUTPATIENT_CLINIC_OR_DEPARTMENT_OTHER): Payer: Medicare Other

## 2016-04-25 DIAGNOSIS — G4719 Other hypersomnia: Secondary | ICD-10-CM | POA: Insufficient documentation

## 2016-04-25 DIAGNOSIS — R55 Syncope and collapse: Principal | ICD-10-CM | POA: Diagnosis present

## 2016-04-25 DIAGNOSIS — Z86711 Personal history of pulmonary embolism: Secondary | ICD-10-CM | POA: Diagnosis not present

## 2016-04-25 DIAGNOSIS — Z955 Presence of coronary angioplasty implant and graft: Secondary | ICD-10-CM | POA: Diagnosis not present

## 2016-04-25 DIAGNOSIS — S4991XA Unspecified injury of right shoulder and upper arm, initial encounter: Secondary | ICD-10-CM | POA: Diagnosis not present

## 2016-04-25 DIAGNOSIS — Z9049 Acquired absence of other specified parts of digestive tract: Secondary | ICD-10-CM | POA: Insufficient documentation

## 2016-04-25 DIAGNOSIS — Z7984 Long term (current) use of oral hypoglycemic drugs: Secondary | ICD-10-CM | POA: Diagnosis not present

## 2016-04-25 DIAGNOSIS — E1122 Type 2 diabetes mellitus with diabetic chronic kidney disease: Secondary | ICD-10-CM | POA: Diagnosis not present

## 2016-04-25 DIAGNOSIS — Z8249 Family history of ischemic heart disease and other diseases of the circulatory system: Secondary | ICD-10-CM | POA: Insufficient documentation

## 2016-04-25 DIAGNOSIS — Z794 Long term (current) use of insulin: Secondary | ICD-10-CM | POA: Diagnosis not present

## 2016-04-25 DIAGNOSIS — R319 Hematuria, unspecified: Secondary | ICD-10-CM | POA: Diagnosis not present

## 2016-04-25 DIAGNOSIS — Z809 Family history of malignant neoplasm, unspecified: Secondary | ICD-10-CM | POA: Insufficient documentation

## 2016-04-25 DIAGNOSIS — B9689 Other specified bacterial agents as the cause of diseases classified elsewhere: Secondary | ICD-10-CM | POA: Diagnosis not present

## 2016-04-25 DIAGNOSIS — B952 Enterococcus as the cause of diseases classified elsewhere: Secondary | ICD-10-CM | POA: Diagnosis not present

## 2016-04-25 DIAGNOSIS — I272 Pulmonary hypertension, unspecified: Secondary | ICD-10-CM | POA: Insufficient documentation

## 2016-04-25 DIAGNOSIS — Z888 Allergy status to other drugs, medicaments and biological substances status: Secondary | ICD-10-CM | POA: Insufficient documentation

## 2016-04-25 DIAGNOSIS — I10 Essential (primary) hypertension: Secondary | ICD-10-CM | POA: Diagnosis present

## 2016-04-25 DIAGNOSIS — R8299 Other abnormal findings in urine: Secondary | ICD-10-CM | POA: Diagnosis not present

## 2016-04-25 DIAGNOSIS — S299XXA Unspecified injury of thorax, initial encounter: Secondary | ICD-10-CM | POA: Diagnosis not present

## 2016-04-25 DIAGNOSIS — E118 Type 2 diabetes mellitus with unspecified complications: Secondary | ICD-10-CM | POA: Diagnosis present

## 2016-04-25 DIAGNOSIS — I700 Atherosclerosis of aorta: Secondary | ICD-10-CM | POA: Diagnosis not present

## 2016-04-25 DIAGNOSIS — I083 Combined rheumatic disorders of mitral, aortic and tricuspid valves: Secondary | ICD-10-CM | POA: Insufficient documentation

## 2016-04-25 DIAGNOSIS — N182 Chronic kidney disease, stage 2 (mild): Secondary | ICD-10-CM | POA: Diagnosis not present

## 2016-04-25 DIAGNOSIS — E7800 Pure hypercholesterolemia, unspecified: Secondary | ICD-10-CM | POA: Diagnosis not present

## 2016-04-25 DIAGNOSIS — F329 Major depressive disorder, single episode, unspecified: Secondary | ICD-10-CM | POA: Diagnosis not present

## 2016-04-25 DIAGNOSIS — Z79899 Other long term (current) drug therapy: Secondary | ICD-10-CM | POA: Insufficient documentation

## 2016-04-25 DIAGNOSIS — Z86718 Personal history of other venous thrombosis and embolism: Secondary | ICD-10-CM | POA: Diagnosis not present

## 2016-04-25 DIAGNOSIS — N390 Urinary tract infection, site not specified: Secondary | ICD-10-CM | POA: Diagnosis not present

## 2016-04-25 DIAGNOSIS — R8290 Unspecified abnormal findings in urine: Secondary | ICD-10-CM | POA: Diagnosis not present

## 2016-04-25 DIAGNOSIS — Z9989 Dependence on other enabling machines and devices: Secondary | ICD-10-CM

## 2016-04-25 DIAGNOSIS — N3001 Acute cystitis with hematuria: Secondary | ICD-10-CM

## 2016-04-25 DIAGNOSIS — Z91041 Radiographic dye allergy status: Secondary | ICD-10-CM | POA: Insufficient documentation

## 2016-04-25 DIAGNOSIS — E1140 Type 2 diabetes mellitus with diabetic neuropathy, unspecified: Secondary | ICD-10-CM | POA: Diagnosis not present

## 2016-04-25 DIAGNOSIS — I129 Hypertensive chronic kidney disease with stage 1 through stage 4 chronic kidney disease, or unspecified chronic kidney disease: Secondary | ICD-10-CM | POA: Diagnosis not present

## 2016-04-25 DIAGNOSIS — G4733 Obstructive sleep apnea (adult) (pediatric): Secondary | ICD-10-CM

## 2016-04-25 DIAGNOSIS — E119 Type 2 diabetes mellitus without complications: Secondary | ICD-10-CM | POA: Diagnosis not present

## 2016-04-25 DIAGNOSIS — N400 Enlarged prostate without lower urinary tract symptoms: Secondary | ICD-10-CM | POA: Insufficient documentation

## 2016-04-25 DIAGNOSIS — K219 Gastro-esophageal reflux disease without esophagitis: Secondary | ICD-10-CM | POA: Insufficient documentation

## 2016-04-25 DIAGNOSIS — E785 Hyperlipidemia, unspecified: Secondary | ICD-10-CM | POA: Insufficient documentation

## 2016-04-25 DIAGNOSIS — Z8673 Personal history of transient ischemic attack (TIA), and cerebral infarction without residual deficits: Secondary | ICD-10-CM | POA: Insufficient documentation

## 2016-04-25 DIAGNOSIS — Z7982 Long term (current) use of aspirin: Secondary | ICD-10-CM | POA: Insufficient documentation

## 2016-04-25 DIAGNOSIS — Z7901 Long term (current) use of anticoagulants: Secondary | ICD-10-CM | POA: Insufficient documentation

## 2016-04-25 DIAGNOSIS — I2510 Atherosclerotic heart disease of native coronary artery without angina pectoris: Secondary | ICD-10-CM | POA: Diagnosis not present

## 2016-04-25 DIAGNOSIS — Z8679 Personal history of other diseases of the circulatory system: Secondary | ICD-10-CM | POA: Insufficient documentation

## 2016-04-25 HISTORY — DX: Chronic kidney disease, stage 2 (mild): N18.2

## 2016-04-25 LAB — COMPREHENSIVE METABOLIC PANEL
ALT: 26 U/L (ref 17–63)
AST: 26 U/L (ref 15–41)
Albumin: 4 g/dL (ref 3.5–5.0)
Alkaline Phosphatase: 61 U/L (ref 38–126)
Anion gap: 6 (ref 5–15)
BUN: 24 mg/dL — ABNORMAL HIGH (ref 6–20)
CO2: 30 mmol/L (ref 22–32)
Calcium: 9.2 mg/dL (ref 8.9–10.3)
Chloride: 101 mmol/L (ref 101–111)
Creatinine, Ser: 1.28 mg/dL — ABNORMAL HIGH (ref 0.61–1.24)
GFR calc Af Amer: >60 mL/min (ref >60)
GFR calc non Af Amer: 53 mL/min — ABNORMAL LOW (ref >60)
Glucose, Bld: 252 mg/dL — ABNORMAL HIGH (ref 65–99)
Potassium: 4.3 mmol/L (ref 3.5–5.1)
Sodium: 137 mmol/L (ref 135–145)
Total Bilirubin: 0.7 mg/dL (ref 0.3–1.2)
Total Protein: 7.3 g/dL (ref 6.5–8.1)

## 2016-04-25 LAB — CBC WITH DIFFERENTIAL/PLATELET
Basophils Absolute: 0 10*3/uL (ref 0.0–0.1)
Basophils Relative: 0 %
Eosinophils Absolute: 0.1 10*3/uL (ref 0.0–0.7)
Eosinophils Relative: 1 %
HCT: 39.1 % (ref 39.0–52.0)
Hemoglobin: 13.3 g/dL (ref 13.0–17.0)
Lymphocytes Relative: 33 %
Lymphs Abs: 2.1 10*3/uL (ref 0.7–4.0)
MCH: 31.4 pg (ref 26.0–34.0)
MCHC: 34 g/dL (ref 30.0–36.0)
MCV: 92.4 fL (ref 78.0–100.0)
Monocytes Absolute: 0.6 10*3/uL (ref 0.1–1.0)
Monocytes Relative: 10 %
Neutro Abs: 3.6 10*3/uL (ref 1.7–7.7)
Neutrophils Relative %: 56 %
Platelets: 191 10*3/uL (ref 150–400)
RBC: 4.23 MIL/uL (ref 4.22–5.81)
RDW: 12.5 % (ref 11.5–15.5)
WBC: 6.5 10*3/uL (ref 4.0–10.5)

## 2016-04-25 LAB — TROPONIN I: Troponin I: <0.03 ng/mL (ref <0.03)

## 2016-04-25 LAB — CBG MONITORING, ED: Glucose-Capillary: 254 mg/dL — ABNORMAL HIGH (ref 65–99)

## 2016-04-25 MED ORDER — SODIUM CHLORIDE 0.9 % IV BOLUS (SEPSIS)
500.0000 mL | Freq: Once | INTRAVENOUS | Status: AC
  Administered 2016-04-25: 500 mL via INTRAVENOUS

## 2016-04-25 NOTE — ED Triage Notes (Signed)
Pt c/o having blurred vision approx 715pm-stood to get gingerale-pt's girlfriend stated pt turned to go to couch then began to fall/pass out-girlfriend assisted pt to couch-few seconds of LOC-pt states his recall was he woke to her standing over him-pt sent from [facility]-steady gait-denies HA-pain to right chest into neck-thinks may be r/t to fall

## 2016-04-25 NOTE — ED Notes (Signed)
ED Provider at bedside. 

## 2016-04-25 NOTE — ED Provider Notes (Signed)
TIME SEEN: 11:08 PM  CHIEF COMPLAINT: LOC  HPI: Steve Brown is a 76 y.o. male with a PMHx of stroke, HTN, DM, high cholesterol, who presents to the Emergency Department complaining of LOC onset 7:15 PM tonight. Pt reports associated right sided chest, right sided neck pain, right shoulder, and mild HA. Pt has not tried any medications for the relief of his symptoms. He notes that he began to feel "woozy" and "fuzzy" following eating dinner and reports that he consumed a ginger ale, due to thinking that his blood sugar was low. States that he felt like he was going to pass out. His vision was blurry. He denies associated chest pain or shortness of breath. Pt states that he stood up and only remembers waking up on the floor. Pt significant other notes that she noticed the pt falling and aided the pt to the ground. Pt significant other states that the pt right shoulder was holding onto the countertop and was in a weird position during his fall. Pt reports that he was evaluated at [facility] and was informed to come into the ED for further evaluation of his symptoms. He denies numbness, tingling, SOB, CP, fever, cough, vomiting, diarrhea, and any other symptoms. Pt states that he takes insulin and pills for his hx of DM. Denies smoking cigarettes or PMHx of A-fib. Denies any known history of CHF. Pt states that he takes daily xarelto following his stroke and had a cardiac stent placed in 2010. Per his records it also appears patient recently was diagnosed with a pulmonary embolus.   ROS: See HPI Constitutional: no fever  Eyes: no drainage  ENT: no runny nose   Cardiovascular:  no chest pain  Resp: no SOB  GI: no vomiting GU: no dysuria Integumentary: no rash  Allergy: no hives  Musculoskeletal: no leg swelling  Neurological: +LOC. no slurred speech ROS otherwise negative  PAST MEDICAL HISTORY/PAST SURGICAL HISTORY:  Past Medical History:  Diagnosis Date  . Benign localized hyperplasia  of prostate without urinary obstruction and other lower urinary tract symptoms (LUTS)   . Blood dyscrasia   . Coronary artery disease   . Degeneration of cervical intervertebral disc   . Depression   . Diabetes mellitus   . Dyspnea    with exertion  . GERD (gastroesophageal reflux disease)   . History of TIAs   . Hypersomnia with sleep apnea, unspecified   . Hypertension   . Lumbago   . Neuropathy   . Other and unspecified hyperlipidemia   . Pain in joint, multiple sites   . PE (pulmonary thromboembolism) ([place])   . Personal history of unspecified circulatory disease   . Pneumonia 2005   2010  . Sleep apnea   . Stroke [facility]) 2002, 2003   both sided weakness  . Trigger finger of both hands 11-17-13  . Type II or unspecified type diabetes mellitus without mention of complication, not stated as uncontrolled   . Unspecified cardiovascular disease   . Unspecified essential hypertension   . Unspecified fall   . Unsteady gait 05/19/2013   X 24 hours - improving. Hx prior CVA/TIAs; multiple risk factors; on Xarelto    MEDICATIONS:  Prior to Admission medications   Medication Sig Start Date End Date Taking? Authorizing Provider  amoxicillin-clavulanate (AUGMENTIN XR) 1000-62.5 MG 12 hr tablet Take 2 tablets by mouth 2 (two) times daily. 10/13/15   Deno Etienne, DO  Ascorbic Acid (VITAMIN C PO) Take 1 tablet by mouth daily.  Historical Provider, MD  aspirin EC 81 MG tablet Take 1 tablet (81 mg total) by mouth daily. 11/05/14   Nita Sells, MD  BD PEN NEEDLE NANO U/F 32G X 4 MM MISC by Does not apply route.  09/01/13   Historical Provider, MD  bisacodyl (DULCOLAX) 5 MG EC tablet Take 5 mg by mouth daily as needed for moderate constipation.    Historical Provider, MD  chlorhexidine (PERIDEX) 0.12 % solution Use as directed 15 mLs in the mouth or throat 2 (two) times daily.  07/25/13   Historical Provider, MD  cholecalciferol (VITAMIN D) 1000 UNITS tablet Take 1,000 Units by mouth daily.     Historical Provider, MD  diclofenac sodium (VOLTAREN) 1 % GEL Apply 2 g topically 3 (three) times daily as needed (pain).    Historical Provider, MD  dutasteride (AVODART) 0.5 MG capsule Take 0.5 mg by mouth daily.    Historical Provider, MD  ferrous sulfate 325 (65 FE) MG tablet Take 325 mg by mouth daily with breakfast.    Historical Provider, MD  fluticasone (FLONASE) 50 MCG/ACT nasal spray Place 2 sprays into both nostrils daily as needed for allergies.  05/30/13   Historical Provider, MD  furosemide (LASIX) 20 MG tablet Take 20 mg by mouth daily as needed (swelling).     Historical Provider, MD  HUMALOG MIX 75/25 KWIKPEN (75-25) 100 UNIT/ML Kwikpen Inject 18-20 Units into the skin 2 (two) times daily. 18 units in evening, and 20 units in the morning. 10/21/13   Historical Provider, MD  lactobacillus acidophilus & bulgar (LACTINEX) chewable tablet Chew 1 tablet by mouth daily.  05/19/14   Historical Provider, MD  memantine (NAMENDA TITRATION PAK) tablet pack 5 mg/day for =1 week; 5 mg twice daily for =1 week; 15 mg/day given in 5 mg and 10 mg separated doses for =1 week; then 10 mg twice daily 02/24/16   Larey Seat, MD  memantine (NAMENDA) 10 MG tablet Take 1 tablet (10 mg total) by mouth 2 (two) times daily. 03/27/16   Asencion Partridge Dohmeier, MD  metFORMIN (GLUCOPHAGE) 500 MG tablet Take 500 mg by mouth 2 (two) times daily with a meal.    Historical Provider, MD  Multiple Vitamin (MULTIVITAMIN WITH MINERALS) TABS Take 1 tablet by mouth every evening.     Historical Provider, MD  nitroGLYCERIN (NITROSTAT) 0.4 MG SL tablet Place 1 tablet (0.4 mg total) under the tongue every 5 (five) minutes as needed for chest pain. 12/07/15   Jettie Booze, MD  nystatin (MYCOSTATIN) 100000 UNIT/ML suspension Take 5 mLs by mouth 2 (two) times daily as needed (for oral thrush.).  10/09/13   Historical Provider, MD  pantoprazole (PROTONIX) 40 MG tablet Take 40 mg by mouth daily.    Historical Provider, MD   pregabalin (LYRICA) 300 MG capsule Take 1 capsule (300 mg total) by mouth 2 (two) times daily. 07/26/12   Asencion Partridge Dohmeier, MD  ramipril (ALTACE) 2.5 MG capsule Take 2.5 mg by mouth daily. 09/08/14   Historical Provider, MD  rivaroxaban (XARELTO) 20 MG TABS tablet Take 20 mg by mouth daily.     Historical Provider, MD  rosuvastatin (CRESTOR) 10 MG tablet Take 10 mg by mouth every evening.     Historical Provider, MD  valACYclovir (VALTREX) 500 MG tablet Take 500 mg by mouth daily.    Historical Provider, MD    ALLERGIES:  Allergies  Allergen Reactions  . Other Other (See Comments)    Per patient- cardiac cath dye-  "  woke up during procedure hysterical."  . Phenergan [Promethazine] Other (See Comments)    Mood changes     SOCIAL HISTORY:  Social History  Substance Use Topics  . Smoking status: Never Smoker  . Smokeless tobacco: Never Used  . Alcohol use No    FAMILY HISTORY: Family History  Problem Relation Age of Onset  . Aneurysm Mother   . Cancer Father   . Heart attack Neg Hx     EXAM: BP (!) 164/77 (BP Location: Left Arm)   Pulse 60   Temp 98.4 F (36.9 C) (Oral)   Resp 18   Ht 5\' 5"  (1.651 m)   Wt 180 lb (81.6 kg)   SpO2 100%   BMI 29.95 kg/m  CONSTITUTIONAL: Alert and oriented and responds appropriately to questions. Well-appearing; well-nourished HEAD: Normocephalic, Atraumatic EYES: Conjunctivae clear, pupils appear equal, EOMI ENT: normal nose; moist mucous membranes NECK: Supple, no meningismus, no nuchal rigidity, no LAD; no midline spinal tenderness or step-off or deformity CARD: RRR; S1 and S2 appreciated; no murmurs, no clicks, no rubs, no gallops RESP: Normal chest excursion without splinting or tachypnea; breath sounds clear and equal bilaterally; no wheezes, no rhonchi, no rales, no hypoxia or respiratory distress, speaking full sentences ABD/GI: Normal bowel sounds; non-distended; soft, non-tender, no rebound, no guarding, no peritoneal signs, no  hepatosplenomegaly BACK:  The back appears normal and is non-tender to palpation, there is no CVA tenderness, no midline spinal tenderness or step-off or deformity EXT: Mildly tender throughout the right shoulder without obvious deformity. Full range of motion in this joint. 2+ radial pulses bilaterally. 2+ DP pulses bilaterally. Normal ROM in all joints; otherwise extremities are non-tender to palpation; no edema; normal capillary refill; no cyanosis, no calf tenderness or swelling    SKIN: Normal color for age and race; warm; no rash NEURO: Moves all extremities equally.  Strength 5/5 in all four extremities.  Normal sensation diffusely.  CN 2-12 grossly intact.  No dysmetria to finger to nose testing bilaterally.  Normal speech.   PSYCH: The patient's mood and manner are appropriate. Grooming and personal hygiene are appropriate.  MEDICAL DECISION MAKING: Patient here with syncopal event. Was diagnosed with urinary tract infection urgent care which could contribute to his symptoms but given his significant cardiac history I feel he should be monitored in the hospital. Will obtain labs, urine, CT of the head, chest x-ray. He is neurologically intact currently and has no complaints other than right shoulder pain. We'll obtain x-rays of the shoulder. No other sign of trauma on exam.  ED PROGRESS: Patient's labs are unremarkable other than very mild acute renal failure which he has received 500 mL of IV fluids for. Head CT normal. Chest x-ray clear. X-ray of the shoulder shows no acute abnormality. He does appear to have urinary tract infection and culture is pending. We'll start him on ceftriaxone. Have recommended admission for cardiac monitoring, possible echocardiogram. It appears his last echo was in 2015 per our records. Patient comfortable with this plan.   2:29 AM Discussed patient's case with Blaine Hamper, Dr. hospitalist.  I have recommended admission and patient (and family if present) agree with this  plan. Admitting physician will place admission orders.   I reviewed all nursing notes, vitals, pertinent previous records, EKGs, lab and urine results, imaging (as available).     EKG Interpretation  Date/Time:  Tuesday April 25 2016 22:44:38 EDT Ventricular Rate:  57 PR Interval:  162 QRS Duration: 84 QT Interval:  392 QTC Calculation: 381 R Axis:   11 Text Interpretation:  Sinus bradycardia Otherwise normal ECG No significant change since last tracing Confirmed by Rosalee Tolley,  DO, Jequan Shahin [phone]) on 04/25/2016 11:05:43 PM        I personally performed the services described in this documentation, which was scribed in my presence. The recorded information has been reviewed and is accurate.    [place], DO 04/26/16 0630

## 2016-04-25 NOTE — ED Notes (Signed)
Patient transported to CT 

## 2016-04-26 ENCOUNTER — Emergency Department (HOSPITAL_BASED_OUTPATIENT_CLINIC_OR_DEPARTMENT_OTHER): Payer: Medicare Other

## 2016-04-26 ENCOUNTER — Other Ambulatory Visit (HOSPITAL_BASED_OUTPATIENT_CLINIC_OR_DEPARTMENT_OTHER): Payer: Self-pay | Admitting: Radiology

## 2016-04-26 ENCOUNTER — Encounter (HOSPITAL_COMMUNITY): Payer: Self-pay | Admitting: Family Medicine

## 2016-04-26 ENCOUNTER — Observation Stay (HOSPITAL_COMMUNITY): Payer: Medicare Other

## 2016-04-26 DIAGNOSIS — S4991XA Unspecified injury of right shoulder and upper arm, initial encounter: Secondary | ICD-10-CM | POA: Diagnosis not present

## 2016-04-26 DIAGNOSIS — G4733 Obstructive sleep apnea (adult) (pediatric): Secondary | ICD-10-CM

## 2016-04-26 DIAGNOSIS — I10 Essential (primary) hypertension: Secondary | ICD-10-CM

## 2016-04-26 DIAGNOSIS — I2510 Atherosclerotic heart disease of native coronary artery without angina pectoris: Secondary | ICD-10-CM

## 2016-04-26 DIAGNOSIS — Z9989 Dependence on other enabling machines and devices: Secondary | ICD-10-CM | POA: Diagnosis not present

## 2016-04-26 DIAGNOSIS — N3001 Acute cystitis with hematuria: Secondary | ICD-10-CM

## 2016-04-26 DIAGNOSIS — N390 Urinary tract infection, site not specified: Secondary | ICD-10-CM | POA: Diagnosis present

## 2016-04-26 DIAGNOSIS — Z794 Long term (current) use of insulin: Secondary | ICD-10-CM | POA: Diagnosis not present

## 2016-04-26 DIAGNOSIS — S299XXA Unspecified injury of thorax, initial encounter: Secondary | ICD-10-CM | POA: Diagnosis not present

## 2016-04-26 DIAGNOSIS — E118 Type 2 diabetes mellitus with unspecified complications: Secondary | ICD-10-CM

## 2016-04-26 DIAGNOSIS — R55 Syncope and collapse: Secondary | ICD-10-CM | POA: Diagnosis not present

## 2016-04-26 DIAGNOSIS — Z86711 Personal history of pulmonary embolism: Secondary | ICD-10-CM | POA: Diagnosis not present

## 2016-04-26 DIAGNOSIS — N182 Chronic kidney disease, stage 2 (mild): Secondary | ICD-10-CM | POA: Diagnosis present

## 2016-04-26 LAB — URINALYSIS, ROUTINE W REFLEX MICROSCOPIC
Bilirubin Urine: NEGATIVE
Glucose, UA: 250 mg/dL — AB
Ketones, ur: NEGATIVE mg/dL
Nitrite: NEGATIVE
Protein, ur: NEGATIVE mg/dL
Specific Gravity, Urine: 1.018 (ref 1.005–1.030)
pH: 6.5 (ref 5.0–8.0)

## 2016-04-26 LAB — GLUCOSE, CAPILLARY
Glucose-Capillary: 78 mg/dL (ref 65–99)
Glucose-Capillary: 183 mg/dL — ABNORMAL HIGH (ref 65–99)
Glucose-Capillary: 245 mg/dL — ABNORMAL HIGH (ref 65–99)
Glucose-Capillary: 255 mg/dL — ABNORMAL HIGH (ref 65–99)
Glucose-Capillary: 224 mg/dL — ABNORMAL HIGH (ref 65–99)

## 2016-04-26 LAB — BASIC METABOLIC PANEL
Anion gap: 7 (ref 5–15)
BUN: 16 mg/dL (ref 6–20)
CO2: 29 mmol/L (ref 22–32)
Calcium: 8.8 mg/dL — ABNORMAL LOW (ref 8.9–10.3)
Chloride: 103 mmol/L (ref 101–111)
Creatinine, Ser: 1.08 mg/dL (ref 0.61–1.24)
GFR calc Af Amer: >60 mL/min (ref >60)
GFR calc non Af Amer: >60 mL/min (ref >60)
Glucose, Bld: 103 mg/dL — ABNORMAL HIGH (ref 65–99)
Potassium: 3.7 mmol/L (ref 3.5–5.1)
Sodium: 139 mmol/L (ref 135–145)

## 2016-04-26 LAB — HEMOGLOBIN A1C
Hgb A1c MFr Bld: 9.2 % — ABNORMAL HIGH (ref 4.8–5.6)
Mean Plasma Glucose: 217 mg/dL

## 2016-04-26 LAB — URINALYSIS, MICROSCOPIC (REFLEX)

## 2016-04-26 MED ORDER — SODIUM CHLORIDE 0.9% FLUSH
3.0000 mL | Freq: Two times a day (BID) | INTRAVENOUS | Status: DC
  Administered 2016-04-26 – 2016-04-27 (×2): 3 mL via INTRAVENOUS

## 2016-04-26 MED ORDER — HYDROCODONE-ACETAMINOPHEN 5-325 MG PO TABS
1.0000 | ORAL_TABLET | Freq: Once | ORAL | Status: AC
  Administered 2016-04-26: 1 via ORAL
  Filled 2016-04-26: qty 1

## 2016-04-26 MED ORDER — ONDANSETRON HCL 4 MG/2ML IJ SOLN: 4.0000 mg | Freq: Four times a day (QID) | INTRAMUSCULAR | Status: DC | PRN

## 2016-04-26 MED ORDER — VITAMIN D 1000 UNITS PO TABS
1000.0000 [IU] | ORAL_TABLET | Freq: Every day | ORAL | Status: DC
  Administered 2016-04-26 – 2016-04-27 (×2): 1000 [IU] via ORAL
  Filled 2016-04-26 (×2): qty 1

## 2016-04-26 MED ORDER — INSULIN ASPART 100 UNIT/ML ~~LOC~~ SOLN
0.0000 [IU] | Freq: Three times a day (TID) | SUBCUTANEOUS | Status: DC
  Administered 2016-04-26: 5 [IU] via SUBCUTANEOUS
  Administered 2016-04-26: 3 [IU] via SUBCUTANEOUS
  Administered 2016-04-26 – 2016-04-27 (×2): 2 [IU] via SUBCUTANEOUS

## 2016-04-26 MED ORDER — SODIUM CHLORIDE 0.9 % IV SOLN
INTRAVENOUS | Status: AC
  Administered 2016-04-26: 07:00:00 via INTRAVENOUS

## 2016-04-26 MED ORDER — DEXTROSE 5 % IV SOLN
1.0000 g | INTRAVENOUS | Status: DC
  Filled 2016-04-26: qty 10

## 2016-04-26 MED ORDER — DEXTROSE 5 % IV SOLN
1.0000 g | Freq: Once | INTRAVENOUS | Status: AC
  Administered 2016-04-26: 1 g via INTRAVENOUS
  Filled 2016-04-26: qty 10

## 2016-04-26 MED ORDER — INSULIN GLARGINE 100 UNIT/ML ~~LOC~~ SOLN
15.0000 [IU] | Freq: Two times a day (BID) | SUBCUTANEOUS | Status: DC
  Administered 2016-04-26 – 2016-04-27 (×3): 15 [IU] via SUBCUTANEOUS
  Filled 2016-04-26 (×4): qty 0.15

## 2016-04-26 MED ORDER — ADULT MULTIVITAMIN W/MINERALS CH
1.0000 | ORAL_TABLET | Freq: Every evening | ORAL | Status: DC
  Administered 2016-04-26: 1 via ORAL
  Filled 2016-04-26: qty 1

## 2016-04-26 MED ORDER — MEMANTINE HCL 10 MG PO TABS
10.0000 mg | ORAL_TABLET | Freq: Two times a day (BID) | ORAL | Status: DC
Start: 2016-04-26 — End: 2016-04-27
  Administered 2016-04-26 – 2016-04-27 (×3): 10 mg via ORAL
  Filled 2016-04-26 (×3): qty 1

## 2016-04-26 MED ORDER — ACETAMINOPHEN 650 MG RE SUPP: 650.0000 mg | Freq: Four times a day (QID) | RECTAL | Status: DC | PRN

## 2016-04-26 MED ORDER — DICLOFENAC SODIUM 1 % TD GEL
2.0000 g | Freq: Three times a day (TID) | TRANSDERMAL | Status: DC | PRN
Start: 2016-04-26 — End: 2016-04-27

## 2016-04-26 MED ORDER — PREGABALIN 100 MG PO CAPS
300.0000 mg | ORAL_CAPSULE | Freq: Two times a day (BID) | ORAL | Status: DC
  Administered 2016-04-26 – 2016-04-27 (×3): 300 mg via ORAL
  Filled 2016-04-26 (×3): qty 3

## 2016-04-26 MED ORDER — BISACODYL 5 MG PO TBEC
5.0000 mg | DELAYED_RELEASE_TABLET | Freq: Every day | ORAL | Status: DC | PRN
Start: 2016-04-26 — End: 2016-04-27

## 2016-04-26 MED ORDER — DUTASTERIDE 0.5 MG PO CAPS
0.5000 mg | ORAL_CAPSULE | Freq: Every day | ORAL | Status: DC
  Administered 2016-04-26 – 2016-04-27 (×2): 0.5 mg via ORAL
  Filled 2016-04-26 (×2): qty 1

## 2016-04-26 MED ORDER — PANTOPRAZOLE SODIUM 40 MG PO TBEC
40.0000 mg | DELAYED_RELEASE_TABLET | Freq: Every day | ORAL | Status: DC
  Administered 2016-04-26 – 2016-04-27 (×2): 40 mg via ORAL
  Filled 2016-04-26 (×2): qty 1

## 2016-04-26 MED ORDER — ROSUVASTATIN CALCIUM 10 MG PO TABS
10.0000 mg | ORAL_TABLET | Freq: Every evening | ORAL | Status: DC
  Administered 2016-04-26: 10 mg via ORAL
  Filled 2016-04-26: qty 1

## 2016-04-26 MED ORDER — ASPIRIN EC 81 MG PO TBEC
81.0000 mg | DELAYED_RELEASE_TABLET | Freq: Every day | ORAL | Status: DC
  Administered 2016-04-26 – 2016-04-27 (×2): 81 mg via ORAL
  Filled 2016-04-26 (×2): qty 1

## 2016-04-26 MED ORDER — INSULIN ASPART 100 UNIT/ML ~~LOC~~ SOLN: 0.0000 [IU] | Freq: Three times a day (TID) | SUBCUTANEOUS | Status: DC

## 2016-04-26 MED ORDER — RIVAROXABAN 20 MG PO TABS
20.0000 mg | ORAL_TABLET | Freq: Every day | ORAL | Status: DC
  Administered 2016-04-26 – 2016-04-27 (×2): 20 mg via ORAL
  Filled 2016-04-26 (×2): qty 1

## 2016-04-26 MED ORDER — SODIUM CHLORIDE 0.9% FLUSH: 3.0000 mL | Freq: Two times a day (BID) | INTRAVENOUS | Status: DC

## 2016-04-26 MED ORDER — ACETAMINOPHEN 325 MG PO TABS
650.0000 mg | ORAL_TABLET | Freq: Four times a day (QID) | ORAL | Status: DC | PRN
  Administered 2016-04-26: 650 mg via ORAL
  Filled 2016-04-26: qty 2

## 2016-04-26 MED ORDER — RAMIPRIL 2.5 MG PO CAPS
2.5000 mg | ORAL_CAPSULE | Freq: Every day | ORAL | Status: DC
Start: 2016-04-26 — End: 2016-04-27
  Administered 2016-04-26: 2.5 mg via ORAL
  Filled 2016-04-26 (×2): qty 1

## 2016-04-26 MED ORDER — RIVAROXABAN 20 MG PO TABS
20.0000 mg | ORAL_TABLET | Freq: Every day | ORAL | Status: DC
Start: 2016-04-26 — End: 2016-04-26

## 2016-04-26 MED ORDER — ONDANSETRON HCL 4 MG PO TABS: 4.0000 mg | ORAL_TABLET | Freq: Four times a day (QID) | ORAL | Status: DC | PRN

## 2016-04-26 MED ORDER — FLUTICASONE PROPIONATE 50 MCG/ACT NA SUSP: 2.0000 | Freq: Every day | NASAL | Status: DC | PRN

## 2016-04-26 MED ORDER — CYCLOBENZAPRINE HCL 5 MG PO TABS
5.0000 mg | ORAL_TABLET | Freq: Once | ORAL | Status: AC
  Administered 2016-04-26: 5 mg via ORAL
  Filled 2016-04-26: qty 1

## 2016-04-26 MED ORDER — LACTINEX PO CHEW
1.0000 | CHEWABLE_TABLET | Freq: Every day | ORAL | Status: DC
  Administered 2016-04-26 – 2016-04-27 (×2): 1 via ORAL
  Filled 2016-04-26 (×2): qty 1

## 2016-04-26 MED ORDER — VALACYCLOVIR HCL 500 MG PO TABS
500.0000 mg | ORAL_TABLET | Freq: Every day | ORAL | Status: DC
  Administered 2016-04-26 – 2016-04-27 (×2): 500 mg via ORAL
  Filled 2016-04-26 (×2): qty 1

## 2016-04-26 MED ORDER — SODIUM CHLORIDE 0.9 % IV SOLN: 250.0000 mL | INTRAVENOUS | Status: DC | PRN

## 2016-04-26 MED ORDER — SODIUM CHLORIDE 0.9% FLUSH: 3.0000 mL | INTRAVENOUS | Status: DC | PRN

## 2016-04-26 MED ORDER — INSULIN ASPART 100 UNIT/ML ~~LOC~~ SOLN
0.0000 [IU] | Freq: Every day | SUBCUTANEOUS | Status: DC
  Administered 2016-04-26: 2 [IU] via SUBCUTANEOUS

## 2016-04-26 NOTE — Progress Notes (Signed)
Patient refused CPAP for the night  

## 2016-04-26 NOTE — Progress Notes (Signed)
Patient seen and examined, admitted early this morning by Dr. Tobi Bastos evaluation of syncopal episode. Note, syncopal episode was preceded by prodromal symptoms of lightheadedness and dizziness. Found to have UTI-does endorse frequency of urination and mild suprapubic pain. He apparently has been having issues with hematuria for the past year, and is following with urology.  Telemetry-no arrhythmia  Vital signs: Stable Chest: Bilateral clear CVS: S1-S2 regular Abdomen: Soft nontender Neuro: Nonfocal  Impression: Syncope with prodromal symptoms of lightheadedness: Suspect vasovagal episode. Orthostatic vital signs negative. Per Patient his CBGs were in the 200s range.  UTI: Continue Rocephin, await cultures  History of venous thromboembolism: On anticoagulation with Xarelto.  History of CAD status post remote PCI 8 years back: No chest pain or shortness of breath. EKG/troponins negative.  Plan: Await echo  Continue Rocephin-await cultures  Probably home once workup is complete  Rest as outlined by admitting M.D.

## 2016-04-26 NOTE — Progress Notes (Signed)
Inpatient Diabetes Program Recommendations  AACE/ADA: New Consensus Statement on Inpatient Glycemic Control (2015)  Target Ranges:  Prepandial:   less than 140 mg/dL      Peak postprandial:   less than 180 mg/dL (1-2 hours)      Critically ill patients:  140 - 180 mg/dL   Lab Results  Component Value Date   GLUCAP 183 (H) 04/26/2016   HGBA1C 7.8 (H) 10/29/2015    Review of Glycemic Control:  Results for Steve Brown, Steve Brown (MRN [id number]) as of 04/26/2016 09:16  Ref. Range 04/25/2016 22:48 04/26/2016 04:50 04/26/2016 07:50  Glucose-Capillary Latest Ref Range: 65 - 99 mg/dL 254 (H) 78 183 (H)   Diabetes history: Type 2 diabetes Outpatient Diabetes medications: 75/25 20 units AM and 18 units PM, Metformin 500 mg bid Current orders for Inpatient glycemic control:  Lantus 15 units bid, Novolog moderate tid with meals and HS Inpatient Diabetes Program Recommendations:    Note concerns regarding hypoglycemia.  Please consider reducing Lantus to 15 units daily. Will follow.  Thanks, Adah Perl, RN, BC-ADM Inpatient Diabetes Coordinator Pager [phone] (8a-5p)

## 2016-04-26 NOTE — Progress Notes (Signed)
This is a no charge note  Transfer from [facility] per Dr. Leonides Schanz  76 year old male with past medical history of hypertension, hyperlipidemia, diabetes mellitus, GERD, depression, stroke, OSA, PE on Xarelto, CAD, s/p of stent placement, CKD-2, who presents with syncope. Also has right-sided chest pain, right neck pain and mild headache. Chest x-ray negative. X-ray of right shoulder negative. CT head is negative. Urinalysis positive for UTI with large amount of leukocyte. Troponin negative. WBC 6.2, temperature normal, O2 sats section 96% on room air. Pt is accepted to tele bed for obs. IV rocephin was given in ED.  Ivor Costa, MD  [facility] Pager [phone]  If 7PM-7AM, please contact night-coverage [email or web address] Password [facility] 04/26/2016, 2:44 AM

## 2016-04-26 NOTE — CV Procedure (Signed)
2D ech attempted twice, once patient eating , them procedure in progress. Will try echo later

## 2016-04-26 NOTE — ED Notes (Signed)
Reports called to [place], rn  on the floor

## 2016-04-26 NOTE — H&P (Signed)
History and Physical    Steve Brown RJJ:[id number] DOB: 22-Sep-1940 DOA: 04/25/2016  PCP: Melinda Crutch, MD   Patient coming from: Home, by way of [facility]  Chief Complaint: Syncope  HPI: Steve Brown is a 76 y.o. male with medical history significant for hypertension, insulin-dependent diabetes mellitus, history of CVA, history of PE on Xarelto, CAD, OSA on CPAP, and GERD who presents the emergency department following a syncopal episode. Patient reports that he had been in his usual state of health and was having an uneventful day when he was at a friend's house for dinner and became acutely lightheaded before standing up and losing consciousness. Patient reports that he was preparing to eat when he developed acute lightheadedness with blurring of his vision. He felt as though he was having a hypoglycemic episode and got up to get some ginger ale. Upon standing, patient's symptoms worsened acutely and he reached to a table to support himself while his friend attempted to hold them up. He reportedly lost consciousness and slipped to the ground onto his right side. He regained alertness within seconds and felt better after drinking the ginger ale. After the episode, he was experiencing some mild lightheadedness and nausea, as well as mild right shoulder pain which she believes is secondary to the fall as he was trying to hold himself up with his right arm on a table. Patient went to a walk-in clinic where he was advised to seek further evaluation in the ED.  [facility] ED Course: Upon arrival to the [facility] ED, patient is found to be afebrile, saturating well on room air, and with vital signs stable. Orthostatic vitals are negative. EKG features a sinus bradycardia with rate 57 and is otherwise normal. Chest x-ray is notable for shallow inspiration and linear atelectasis in both bases. Right shoulder radiographs are notable for chronic degenerative changes but no acute abnormality. Noncontrast  head CT is negative for acute intracranial abnormality. Chemistry panels notable for a creatinine 1.2 weight, slightly up from apparent baseline of 1.1. Serum glucose is elevated to 252. CBC is unremarkable and troponin is undetectable. Urinalysis is suggestive of infection and hematuria. Patient was given a 500 mL normal saline bolus, urine was sent for culture, and he was treated with a gram of Rocephin IV. He remained hemodynamically stable in the ED and in no apparent respiratory distress. On recheck, glucose is 78 without administration of insulin. Transfer to [facility] for admission was arranged, but with no bed availability there, patient was redirected to [facility]. He is interviewed and examined on telemetry and emesis: Where he remains hemodynamically stable, well-appearing, and in no respiratory distress.  Review of Systems:  All other systems reviewed and apart from HPI, are negative.  Past Medical History:  Diagnosis Date  . Benign localized hyperplasia of prostate without urinary obstruction and other lower urinary tract symptoms (LUTS)   . Blood dyscrasia   . Coronary artery disease   . Degeneration of cervical intervertebral disc   . Depression   . Diabetes mellitus   . Dyspnea    with exertion  . GERD (gastroesophageal reflux disease)   . History of TIAs   . Hypersomnia with sleep apnea, unspecified   . Hypertension   . Lumbago   . Neuropathy   . Other and unspecified hyperlipidemia   . Pain in joint, multiple sites   . PE (pulmonary thromboembolism) ([place])   . Personal history of unspecified circulatory disease   . Pneumonia 2005  2010  . Sleep apnea   . Stroke [facility]) 2002, 2003   both sided weakness  . Trigger finger of both hands 11-17-13  . Type II or unspecified type diabetes mellitus without mention of complication, not stated as uncontrolled   . Unspecified cardiovascular disease   . Unspecified essential hypertension   . Unspecified fall   .  Unsteady gait 05/19/2013   X 24 hours - improving. Hx prior CVA/TIAs; multiple risk factors; on Xarelto    Past Surgical History:  Procedure Laterality Date  . CARDIAC CATHETERIZATION    . CARPAL TUNNEL RELEASE     bilateral  . CERVICAL DISC SURGERY    . CHOLECYSTECTOMY N/A 11/02/2015   Procedure: LAPAROSCOPIC CHOLECYSTECTOMY WITH INTRAOPERATIVE CHOLANGIOGRAM;  Surgeon: Donnie Mesa, MD;  Location: [facility];  Service: General;  Laterality: N/A;  . COLONOSCOPY    . CORONARY ANGIOPLASTY    . CORONARY STENT PLACEMENT  Feb. 2010  . ELBOW SURGERY     right elbow - nerve release  . EYE SURGERY Bilateral    cataract  . HERNIA REPAIR     umbicical  . LEFT HEART CATHETERIZATION WITH CORONARY ANGIOGRAM N/A 04/03/2013   Procedure: LEFT HEART CATHETERIZATION WITH CORONARY ANGIOGRAM;  Surgeon: Jettie Booze, MD;  Location: [facility] CATH LAB;  Service: Cardiovascular;  Laterality: N/A;  . lungs     "fluid pumped off lungs"     reports that he has never smoked. He has never used smokeless tobacco. He reports that he does not drink alcohol or use drugs.  Allergies  Allergen Reactions  . Other Other (See Comments)    Per patient- cardiac cath dye-  "woke up during procedure hysterical."  . Phenergan [Promethazine] Other (See Comments)    Mood changes     Family History  Problem Relation Age of Onset  . Aneurysm Mother   . Cancer Father   . Heart attack Neg Hx      Prior to Admission medications   Medication Sig Start Date End Date Taking? Authorizing Provider  Ascorbic Acid (VITAMIN C PO) Take 1 tablet by mouth daily.    Historical Provider, MD  aspirin EC 81 MG tablet Take 1 tablet (81 mg total) by mouth daily. 11/05/14   Nita Sells, MD  BD PEN NEEDLE NANO U/F 32G X 4 MM MISC by Does not apply route.  09/01/13   Historical Provider, MD  bisacodyl (DULCOLAX) 5 MG EC tablet Take 5 mg by mouth daily as needed for moderate constipation.    Historical Provider, MD  chlorhexidine  (PERIDEX) 0.12 % solution Use as directed 15 mLs in the mouth or throat 2 (two) times daily.  07/25/13   Historical Provider, MD  cholecalciferol (VITAMIN D) 1000 UNITS tablet Take 1,000 Units by mouth daily.    Historical Provider, MD  diclofenac sodium (VOLTAREN) 1 % GEL Apply 2 g topically 3 (three) times daily as needed (pain).    Historical Provider, MD  dutasteride (AVODART) 0.5 MG capsule Take 0.5 mg by mouth daily.    Historical Provider, MD  ferrous sulfate 325 (65 FE) MG tablet Take 325 mg by mouth daily with breakfast.    Historical Provider, MD  fluticasone (FLONASE) 50 MCG/ACT nasal spray Place 2 sprays into both nostrils daily as needed for allergies.  05/30/13   Historical Provider, MD  furosemide (LASIX) 20 MG tablet Take 20 mg by mouth daily as needed (swelling).     Historical Provider, MD  [place] (  75-25) 100 UNIT/ML Kwikpen Inject 18-20 Units into the skin 2 (two) times daily. 18 units in evening, and 20 units in the morning. 10/21/13   Historical Provider, MD  lactobacillus acidophilus & bulgar (LACTINEX) chewable tablet Chew 1 tablet by mouth daily.  05/19/14   Historical Provider, MD  memantine (NAMENDA) 10 MG tablet Take 1 tablet (10 mg total) by mouth 2 (two) times daily. 03/27/16   Asencion Partridge Dohmeier, MD  metFORMIN (GLUCOPHAGE) 500 MG tablet Take 500 mg by mouth 2 (two) times daily with a meal.    Historical Provider, MD  Multiple Vitamin (MULTIVITAMIN WITH MINERALS) TABS Take 1 tablet by mouth every evening.     Historical Provider, MD  nitroGLYCERIN (NITROSTAT) 0.4 MG SL tablet Place 1 tablet (0.4 mg total) under the tongue every 5 (five) minutes as needed for chest pain. 12/07/15   Jettie Booze, MD  nystatin (MYCOSTATIN) 100000 UNIT/ML suspension Take 5 mLs by mouth 2 (two) times daily as needed (for oral thrush.).  10/09/13   Historical Provider, MD  pantoprazole (PROTONIX) 40 MG tablet Take 40 mg by mouth daily.    Historical Provider, MD  pregabalin  (LYRICA) 300 MG capsule Take 1 capsule (300 mg total) by mouth 2 (two) times daily. 07/26/12   Asencion Partridge Dohmeier, MD  ramipril (ALTACE) 2.5 MG capsule Take 2.5 mg by mouth daily. 09/08/14   Historical Provider, MD  rivaroxaban (XARELTO) 20 MG TABS tablet Take 20 mg by mouth daily.     Historical Provider, MD  rosuvastatin (CRESTOR) 10 MG tablet Take 10 mg by mouth every evening.     Historical Provider, MD  valACYclovir (VALTREX) 500 MG tablet Take 500 mg by mouth daily.    Historical Provider, MD    Physical Exam: Vitals:   04/26/16 0200 04/26/16 0300 04/26/16 0330 04/26/16 0436  BP: 115/75 123/78 137/80 (!) 160/84  Pulse: (!) 55 (!) 52  (!) 53  Resp: 17 14 14 16   Temp:    98.5 F (36.9 C)  TempSrc:    Oral  SpO2: 96% 100%  94%  Weight:    82.5 kg (181 lb 14.4 oz)  Height:    5\' 5"  (1.651 m)      Constitutional: NAD, calm, comfortable Eyes: PERTLA, lids and conjunctivae normal ENMT: Mucous membranes are moist. Posterior pharynx clear of any exudate or lesions.   Neck: normal, supple, no masses, no thyromegaly Respiratory: clear to auscultation bilaterally, no wheezing, no crackles. Normal respiratory effort.  Cardiovascular: Rate ~60 and regular. No significant murmur appreciated. No extremity edema. No significant JVD. Abdomen: No distension, no tenderness, no masses palpated. Bowel sounds normal.  Musculoskeletal: no clubbing / cyanosis. No joint deformity upper and lower extremities.    Skin: no significant rashes, lesions, ulcers. Warm, dry, well-perfused. Neurologic: CN 2-12 grossly intact. Sensation intact, DTR normal. Strength 5/5 in all 4 limbs.  Psychiatric: Alert and oriented x 3. Normal mood and affect.     Labs on Admission: I have personally reviewed following labs and imaging studies  CBC:  Recent Labs Lab 04/25/16 2306  WBC 6.5  NEUTROABS 3.6  HGB 13.3  HCT 39.1  MCV 92.4  PLT 947   Basic Metabolic Panel:  Recent Labs Lab 04/25/16 2306  NA 137  K  4.3  CL 101  CO2 30  GLUCOSE 252*  BUN 24*  CREATININE 1.28*  CALCIUM 9.2   GFR: Estimated Creatinine Clearance: 50.1 mL/min (A) (by C-G formula based on SCr of 1.28 mg/dL (  H)). Liver Function Tests:  Recent Labs Lab 04/25/16 2306  AST 26  ALT 26  ALKPHOS 61  BILITOT 0.7  PROT 7.3  ALBUMIN 4.0   No results for input(s): LIPASE, AMYLASE in the last 168 hours. No results for input(s): AMMONIA in the last 168 hours. Coagulation Profile: No results for input(s): INR, PROTIME in the last 168 hours. Cardiac Enzymes:  Recent Labs Lab 04/25/16 2306  TROPONINI <0.03   BNP (last 3 results) No results for input(s): PROBNP in the last 8760 hours. HbA1C: No results for input(s): HGBA1C in the last 72 hours. CBG:  Recent Labs Lab 04/25/16 2248 04/26/16 0450  GLUCAP 254* 78   Lipid Profile: No results for input(s): CHOL, HDL, LDLCALC, TRIG, CHOLHDL, LDLDIRECT in the last 72 hours. Thyroid Function Tests: No results for input(s): TSH, T4TOTAL, FREET4, T3FREE, THYROIDAB in the last 72 hours. Anemia Panel: No results for input(s): VITAMINB12, FOLATE, FERRITIN, TIBC, IRON, RETICCTPCT in the last 72 hours. Urine analysis:    Component Value Date/Time   COLORURINE YELLOW 04/25/2016 0033   APPEARANCEUR CLOUDY (A) 04/25/2016 0033   LABSPEC 1.018 04/25/2016 0033   PHURINE 6.5 04/25/2016 0033   GLUCOSEU 250 (A) 04/25/2016 0033   HGBUR LARGE (A) 04/25/2016 0033   BILIRUBINUR NEGATIVE 04/25/2016 0033   KETONESUR NEGATIVE 04/25/2016 0033   PROTEINUR NEGATIVE 04/25/2016 0033   UROBILINOGEN 0.2 11/03/2014 1701   NITRITE NEGATIVE 04/25/2016 0033   LEUKOCYTESUR LARGE (A) 04/25/2016 0033   Sepsis Labs: @LABRCNTIP (procalcitonin:4,lacticidven:4) )No results found for this or any previous visit (from the past 240 hour(s)).   Radiological Exams on Admission: Dg Chest 2 View  Result Date: 04/26/2016 CLINICAL DATA:  Patient passed out and fell earlier this evening, injuring his  right shoulder EXAM: CHEST  2 VIEW COMPARISON:  02/19/2014 FINDINGS: Normal heart size and pulmonary vascularity. Shallow inspiration with linear atelectasis or scarring in the lung bases. No focal consolidation. No blunting of costophrenic angles. No pneumothorax. Degenerative changes in the spine. Postoperative changes in the cervical spine. Surgical clips in the right upper quadrant. Degenerative changes in the shoulders. Calcification of the aorta. IMPRESSION: Shallow inspiration with linear atelectasis in the lung bases. Electronically Signed   By: Lucienne Capers M.D.   On: 04/26/2016 01:09   Dg Shoulder Right  Result Date: 04/26/2016 CLINICAL DATA:  Patient passed out and fell earlier this evening with injury to the right shoulder. EXAM: RIGHT SHOULDER - 2+ VIEW COMPARISON:  None. FINDINGS: Degenerative changes are present in the acromioclavicular and glenohumeral joint. No evidence of acute fracture or dislocation in the right shoulder. No focal bone lesion or bone destruction. Bone cortex appears intact. Incidental postoperative changes in the cervical spine. IMPRESSION: Degenerative changes in the right shoulder. No acute bony abnormalities. Electronically Signed   By: Lucienne Capers M.D.   On: 04/26/2016 01:08   Ct Head Wo Contrast  Result Date: 04/26/2016 CLINICAL DATA:  Blurry vision with syncopal episode EXAM: CT HEAD WITHOUT CONTRAST TECHNIQUE: Contiguous axial images were obtained from the base of the skull through the vertex without intravenous contrast. COMPARISON:  11/04/2014 MRI, CT brain 11/03/2014 FINDINGS: Brain: No acute territorial infarction, intracranial hemorrhage or focal mass lesion is visualized. Old left cerebellar infarct. Mild atrophy. Stable ventricle size. Vascular: No hyperdense vessels. Scattered calcifications at the carotid siphons. Skull: No fracture.  No suspicious bone lesion Sinuses/Orbits: Mucosal thickening in the sphenoid and ethmoid sinuses. Mild mucosal  thickening in the maxillary sinuses. No acute orbital abnormality. Other: Stable  fatty mass along the outer table of the left frontal bone. IMPRESSION: No CT evidence for acute intracranial abnormality. Electronically Signed   By: Donavan Foil M.D.   On: 04/26/2016 00:06    EKG: Independently reviewed. Sinus bradycardia (rate 57), otherwise normal.   Assessment/Plan  1. Syncope  - Pt presents with a syncopal episode upon standing that was immediately preceded by lightheadedness and blurred vision  - Pt suspects this was secondary to hypoglycemia as he experienced similar sxs before; he reports feeling better after drinking a soft drink  - Head CT negative for acute intracranial abnormality  - EKG with sinus brady (rate 57), otherwise normal; sinus brady with rates in mid 50's also seen on some old EKG's  - Orthostatic vitals were negative  - Plan to monitor on telemetry and obtain echocardiogram   2. UTI - Suprapubic tenderness noted and UA consistent with infection - Urine was sent for culture and empiric Rocephin was started  - Continue Rocephin pending culture data   3. Insulin-dependent DM  - A1c was 7.8% in October 2017 - Managed at home with Humalog 75/25, 20 units qAM and 18 units qPM - Check CBG with meals and qHS  - There is some concern for hypoglycemia contributing to the syncope  - Start Lantus 15 units BID with a low-intensity sliding-scale correctional  - Update A1c given concern for possible hypoglycemic episodes    4. CAD - No anginal complaints  - EKG unchanged and troponin undetectable  - Continue Crestor, ASA 81, ramipril   5. Hx of PE  - No evidence for acute VTE  - Continue Xarelto    6. Hypertension - BP is mildly elevated in ED  - Continue ramipril    7. OSA  - Tolerating CPAP at home, will continue qHS    8. Hx of CVA  - No acute focal deficit and head CT negative for acute intracranial abnormality  - Continue ASA and statin - Pt has some  memory-loss and will be continued on Namenda     DVT prophylaxis: Xarelto Code Status: Full  Family Communication: Discussed with patient Disposition Plan: Observe on telemetry Consults called: None Admission status: Observation    Vianne Bulls, MD [facility] Pager [phone]  If 7PM-7AM, please contact night-coverage [email or web address] Password [email or web address]  04/26/2016, 5:29 AM

## 2016-04-27 ENCOUNTER — Observation Stay (HOSPITAL_BASED_OUTPATIENT_CLINIC_OR_DEPARTMENT_OTHER): Payer: Medicare Other

## 2016-04-27 DIAGNOSIS — R55 Syncope and collapse: Secondary | ICD-10-CM | POA: Diagnosis not present

## 2016-04-27 DIAGNOSIS — Z86711 Personal history of pulmonary embolism: Secondary | ICD-10-CM | POA: Diagnosis not present

## 2016-04-27 DIAGNOSIS — I2510 Atherosclerotic heart disease of native coronary artery without angina pectoris: Secondary | ICD-10-CM | POA: Diagnosis not present

## 2016-04-27 DIAGNOSIS — N3001 Acute cystitis with hematuria: Secondary | ICD-10-CM | POA: Diagnosis not present

## 2016-04-27 HISTORY — PX: TRANSTHORACIC ECHOCARDIOGRAM: SHX275

## 2016-04-27 LAB — ECHOCARDIOGRAM COMPLETE
Height: 65 in
Weight: 2915.2 oz

## 2016-04-27 LAB — GLUCOSE, CAPILLARY
Glucose-Capillary: 170 mg/dL — ABNORMAL HIGH (ref 65–99)
Glucose-Capillary: 217 mg/dL — ABNORMAL HIGH (ref 65–99)

## 2016-04-27 MED ORDER — AMOXICILLIN 500 MG PO CAPS: 500.0000 mg | ORAL_CAPSULE | Freq: Three times a day (TID) | ORAL | 0 refills | Status: DC

## 2016-04-27 MED ORDER — AMOXICILLIN 500 MG PO CAPS
500.0000 mg | ORAL_CAPSULE | Freq: Three times a day (TID) | ORAL | Status: DC
  Administered 2016-04-27: 500 mg via ORAL
  Filled 2016-04-27: qty 1

## 2016-04-27 NOTE — Progress Notes (Signed)
Pt has orders to be discharged. Discharge instructions given and pt has no additional questions at this time. Medication regimen reviewed and pt educated. Pt verbalized understanding and has no additional questions. Telemetry box removed. IV removed and site in good condition. Pt stable and waiting for transportation.  Korey Arroyo RN 

## 2016-04-27 NOTE — Progress Notes (Signed)
  Echocardiogram 2D Echocardiogram has been performed.  Steve Brown 04/27/2016, 10:41 AM

## 2016-04-27 NOTE — Progress Notes (Addendum)
Inpatient Diabetes Program Recommendations  AACE/ADA: New Consensus Statement on Inpatient Glycemic Control (2015)  Target Ranges:  Prepandial:   less than 140 mg/dL      Peak postprandial:   less than 180 mg/dL (1-2 hours)      Critically ill patients:  140 - 180 mg/dL   Lab Results  Component Value Date   GLUCAP 170 (H) 04/27/2016   HGBA1C 9.2 (H) 04/26/2016    Review of Glycemic Control Results for [name], [name] (MRN [id number]) as of 04/27/2016 09:26  Ref. Range 04/26/2016 07:50 04/26/2016 10:59 04/26/2016 16:52 04/26/2016 21:35 04/27/2016 07:33  Glucose-Capillary Latest Ref Range: 65 - 99 mg/dL 183 (H) 245 (H) 255 (H) 224 (H) 170 (H)   Diabetes history: Type 2 diabetes Outpatient Diabetes medications: 75/25 20 units AM and 18 units PM, Metformin 500 mg bid Current orders for Inpatient glycemic control:  Lantus 15 units bid, Novolog moderate tid with meals and HS  Inpatient Diabetes Program Recommendations:  Noted postprandial CBGs elevated.  Please consider Novolog meal coverage 4 units tid if eats 50%. Change diet to carb modified.  Thank you, Steve Brown. Steve Filo, RN, MSN, CDE  Diabetes Coordinator Inpatient Glycemic Control Team Team Pager [phone] (8am-5pm) 04/27/2016 9:28 AM

## 2016-04-27 NOTE — Discharge Summary (Signed)
PATIENT DETAILS Name: Steve Brown Age: 76 y.o. Sex: male Date of Birth: Jan 03, 1941 MRN: [id number]. Admitting Physician: Vianne Bulls, MD Z[facility] Harrington Challenger, MD  Admit Date: 04/25/2016 Discharge date: 04/27/2016  Recommendations for Outpatient Follow-up:  1. Follow up with PCP in 1-2 weeks 2. Please obtain BMP/CBC in one week 3. Please follow up on final urine culture results 4. Please ensure follow-up with [name]   Admitted From:  Home  Disposition: [place]:  Yes  Equipment/Devices: None  Discharge Condition: Stable  CODE STATUS: FULL CODE  Diet recommendation:  Heart Healthy / Carb Modified   Brief Summary: See H&P, Labs, Consult and Test reports for all details in brief, patient is a 76 year old male with history of CAD, history of venous thromboembolism on anticoagulation with Xarelto, who has had intermittent hematuria and follows with urology-presented to the hospital for evaluation of his syncopal episode. Please note, syncopal episode was preceded by symptoms of lightheadedness and dizziness. See below for further details  Brief Hospital Course: Syncopal episode: This was preceded by prodromal symptoms of lightheadedness and dizziness. Highly suspicious for vasovagal episode. His orthostatic vital signs were negative. Per patient, his CBGs were in the 200s when he first arrived at the ED/urgent care. He was admitted and monitored in telemetry without any major arrhythmias. Echocardiogram showed preserved EF with focal basal hypertrophy. Case was discussed with his primary cardiologist (Dr Irish Lack) who reviewed the chart and echocardiogram-he did not think patient had obstructive pathology responsible for his syncopal episode given echocardiogram findings. Recommendations are to continue to stay well hydrated, and follow-up with his primary cardiologist in the next few weeks.  UTI: Maintained on Rocephin-urine cultures positive for Enterococcus  faecalis-sensitivities are pending. He did acknowledge intermittent hematuria for the past 1 year, and has had mild suprapubic pain and frequency of urination for the past few days prior to this hospitalization. We will empirically treat him with amoxicillin 1 week-case was discussed with infectious disease Dr Linus Salmons over the phone.   History of intermittent hematuria: Ongoing for the past one year-patient currently following with urologist-have encouraged patient to keep his next appointment  History of CAD status post remote PCI: EKG and troponin was negative.  Rest of his medical problems were stable during this short hospital stay.  Procedures/Studies: Echo 4/19>> - Left ventricle: Normal GLLS at -18.4% The cavity size was normal.   There was severe focal basal hypertrophy. Systolic function was   normal. The estimated ejection fraction was in the range of 60%   to 65%. Wall motion was normal; there were no regional wall   motion abnormalities. Features are consistent with a pseudonormal   left ventricular filling pattern, with concomitant abnormal   relaxation and increased filling pressure (grade 2 diastolic   dysfunction). - Aortic valve: Trileaflet; normal thickness, mildly calcified   leaflets. There was mild regurgitation. - Aorta: Aortic root dimension: 39 mm (ED). - Aortic root: The aortic root was mildly dilated. - Mitral valve: There was mild regurgitation. - Atrial septum: There was increased thickness of the septum,   consistent with lipomatous hypertrophy. - Pulmonary arteries: PA peak pressure: 42 mm Hg (S).   Discharge Diagnoses:  Principal Problem:   Syncope Active Problems:   Diabetes mellitus type 2 with complications (HCC)   HTN (hypertension)   CAD in native artery   History of pulmonary embolism: June 2014,  Takes Xarelto   OSA on CPAP   CKD (chronic kidney disease), stage II  UTI (urinary tract infection)   Discharge Instructions:  Activity:  As  tolerated with Full fall precautions use walker/cane & assistance as needed   Discharge Instructions    Diet - low sodium heart healthy    Complete by:  As directed    Diet Carb Modified    Complete by:  As directed    Increase activity slowly    Complete by:  As directed      Allergies as of 04/27/2016      Reactions   Other Other (See Comments)   Per patient- cardiac cath dye-  "woke up during procedure hysterical."   Phenergan [promethazine] Other (See Comments)   Mood changes       Medication List    TAKE these medications   amoxicillin 500 MG capsule Commonly known as:  AMOXIL Take 1 capsule (500 mg total) by mouth every 8 (eight) hours.   aspirin EC 81 MG tablet Take 1 tablet (81 mg total) by mouth daily.   BD PEN NEEDLE NANO U/F 32G X 4 MM Misc Generic drug:  Insulin Pen Needle by Does not apply route.   bisacodyl 5 MG EC tablet Commonly known as:  DULCOLAX Take 5 mg by mouth daily as needed for moderate constipation.   cholecalciferol 1000 units tablet Commonly known as:  VITAMIN D Take 1,000 Units by mouth daily.   dutasteride 0.5 MG capsule Commonly known as:  AVODART Take 0.5 mg by mouth daily.   furosemide 20 MG tablet Commonly known as:  LASIX Take 20 mg by mouth daily as needed (swelling).   HUMALOG MIX 75/25 KWIKPEN (75-25) 100 UNIT/ML Kwikpen Generic drug:  Insulin Lispro Prot & Lispro Inject 18-20 Units into the skin 2 (two) times daily. 18 units in evening, and 20 units in the morning.   memantine 10 MG tablet Commonly known as:  NAMENDA Take 1 tablet (10 mg total) by mouth 2 (two) times daily.   metFORMIN 500 MG 24 hr tablet Commonly known as:  GLUCOPHAGE-XR Take 500 mg by mouth 2 (two) times daily.   nitroGLYCERIN 0.4 MG SL tablet Commonly known as:  NITROSTAT Place 1 tablet (0.4 mg total) under the tongue every 5 (five) minutes as needed for chest pain.   pantoprazole 40 MG tablet Commonly known as:  PROTONIX Take 40 mg by mouth  daily.   pregabalin 300 MG capsule Commonly known as:  LYRICA Take 1 capsule (300 mg total) by mouth 2 (two) times daily.   ramipril 2.5 MG capsule Commonly known as:  ALTACE Take 2.5 mg by mouth daily.   rivaroxaban 20 MG Tabs tablet Commonly known as:  XARELTO Take 20 mg by mouth daily.   rosuvastatin 10 MG tablet Commonly known as:  CRESTOR Take 10 mg by mouth every evening.   valACYclovir 500 MG tablet Commonly known as:  VALTREX Take 500 mg by mouth daily.   VITAMIN C PO Take 1 tablet by mouth daily.      Follow-up Information    Melinda Crutch, MD Follow up in 1 week(s).   Specialty:  Family Medicine Contact information: [place] [address] [phone]        Larae Grooms, MD. Schedule an appointment as soon as possible for a visit in 1 week(s).   Specialties:  Cardiology, Radiology, Interventional Cardiology Contact information: [address] [place] [address] [phone]          Allergies  Allergen Reactions  . Other  Other (See Comments)    Per patient- cardiac cath dye-  "woke up during procedure hysterical."  . Phenergan [Promethazine] Other (See Comments)    Mood changes       Consultations:   None   Other Procedures/Studies: Dg Chest 2 View  Result Date: 04/26/2016 CLINICAL DATA:  Patient passed out and fell earlier this evening, injuring his right shoulder EXAM: CHEST  2 VIEW COMPARISON:  02/19/2014 FINDINGS: Normal heart size and pulmonary vascularity. Shallow inspiration with linear atelectasis or scarring in the lung bases. No focal consolidation. No blunting of costophrenic angles. No pneumothorax. Degenerative changes in the spine. Postoperative changes in the cervical spine. Surgical clips in the right upper quadrant. Degenerative changes in the shoulders. Calcification of the aorta. IMPRESSION: Shallow inspiration with linear atelectasis in the lung bases. Electronically Signed   By:  Lucienne Capers M.D.   On: 04/26/2016 01:09   Dg Shoulder Right  Result Date: 04/26/2016 CLINICAL DATA:  Patient passed out and fell earlier this evening with injury to the right shoulder. EXAM: RIGHT SHOULDER - 2+ VIEW COMPARISON:  None. FINDINGS: Degenerative changes are present in the acromioclavicular and glenohumeral joint. No evidence of acute fracture or dislocation in the right shoulder. No focal bone lesion or bone destruction. Bone cortex appears intact. Incidental postoperative changes in the cervical spine. IMPRESSION: Degenerative changes in the right shoulder. No acute bony abnormalities. Electronically Signed   By: Lucienne Capers M.D.   On: 04/26/2016 01:08   Ct Head Wo Contrast  Result Date: 04/26/2016 CLINICAL DATA:  Blurry vision with syncopal episode EXAM: CT HEAD WITHOUT CONTRAST TECHNIQUE: Contiguous axial images were obtained from the base of the skull through the vertex without intravenous contrast. COMPARISON:  11/04/2014 MRI, CT brain 11/03/2014 FINDINGS: Brain: No acute territorial infarction, intracranial hemorrhage or focal mass lesion is visualized. Old left cerebellar infarct. Mild atrophy. Stable ventricle size. Vascular: No hyperdense vessels. Scattered calcifications at the carotid siphons. Skull: No fracture.  No suspicious bone lesion Sinuses/Orbits: Mucosal thickening in the sphenoid and ethmoid sinuses. Mild mucosal thickening in the maxillary sinuses. No acute orbital abnormality. Other: Stable fatty mass along the outer table of the left frontal bone. IMPRESSION: No CT evidence for acute intracranial abnormality. Electronically Signed   By: Donavan Foil M.D.   On: 04/26/2016 00:06      TODAY-DAY OF DISCHARGE:  Subjective:   Zeth Buday today has no headache,no chest abdominal pain,no new weakness tingling or numbness, feels much better wants to go home today.   Objective:   Blood pressure (!) 146/77, pulse (!) 51, temperature 97.4 F (36.3 C),  temperature source Oral, resp. rate 18, height 5\' 5"  (1.651 m), weight 82.6 kg (182 lb 3.2 oz), SpO2 98 %.  Intake/Output Summary (Last 24 hours) at 04/27/16 1129 Last data filed at 04/27/16 0655  Gross per 24 hour  Intake              960 ml  Output             1875 ml  Net             -915 ml   Filed Weights   04/25/16 2249 04/26/16 0436 04/27/16 0531  Weight: 81.6 kg (180 lb) 82.5 kg (181 lb 14.4 oz) 82.6 kg (182 lb 3.2 oz)    Exam: Awake Alert, Oriented *3, No new F.N deficits, Normal affect [place].AT,PERRAL Supple Neck,No JVD, No cervical lymphadenopathy appriciated.  Symmetrical Chest wall movement, Good air movement  bilaterally, CTAB RRR,No Gallops,Rubs or new Murmurs, No Parasternal Heave +ve B.Sounds, Abd Soft, Non tender, No organomegaly appriciated, No rebound -guarding or rigidity. No Cyanosis, Clubbing or edema, No new Rash or bruise   PERTINENT RADIOLOGIC STUDIES: Dg Chest 2 View  Result Date: 04/26/2016 CLINICAL DATA:  Patient passed out and fell earlier this evening, injuring his right shoulder EXAM: CHEST  2 VIEW COMPARISON:  02/19/2014 FINDINGS: Normal heart size and pulmonary vascularity. Shallow inspiration with linear atelectasis or scarring in the lung bases. No focal consolidation. No blunting of costophrenic angles. No pneumothorax. Degenerative changes in the spine. Postoperative changes in the cervical spine. Surgical clips in the right upper quadrant. Degenerative changes in the shoulders. Calcification of the aorta. IMPRESSION: Shallow inspiration with linear atelectasis in the lung bases. Electronically Signed   By: Lucienne Capers M.D.   On: 04/26/2016 01:09   Dg Shoulder Right  Result Date: 04/26/2016 CLINICAL DATA:  Patient passed out and fell earlier this evening with injury to the right shoulder. EXAM: RIGHT SHOULDER - 2+ VIEW COMPARISON:  None. FINDINGS: Degenerative changes are present in the acromioclavicular and glenohumeral joint. No evidence of  acute fracture or dislocation in the right shoulder. No focal bone lesion or bone destruction. Bone cortex appears intact. Incidental postoperative changes in the cervical spine. IMPRESSION: Degenerative changes in the right shoulder. No acute bony abnormalities. Electronically Signed   By: Lucienne Capers M.D.   On: 04/26/2016 01:08   Ct Head Wo Contrast  Result Date: 04/26/2016 CLINICAL DATA:  Blurry vision with syncopal episode EXAM: CT HEAD WITHOUT CONTRAST TECHNIQUE: Contiguous axial images were obtained from the base of the skull through the vertex without intravenous contrast. COMPARISON:  11/04/2014 MRI, CT brain 11/03/2014 FINDINGS: Brain: No acute territorial infarction, intracranial hemorrhage or focal mass lesion is visualized. Old left cerebellar infarct. Mild atrophy. Stable ventricle size. Vascular: No hyperdense vessels. Scattered calcifications at the carotid siphons. Skull: No fracture.  No suspicious bone lesion Sinuses/Orbits: Mucosal thickening in the sphenoid and ethmoid sinuses. Mild mucosal thickening in the maxillary sinuses. No acute orbital abnormality. Other: Stable fatty mass along the outer table of the left frontal bone. IMPRESSION: No CT evidence for acute intracranial abnormality. Electronically Signed   By: Donavan Foil M.D.   On: 04/26/2016 00:06     PERTINENT LAB RESULTS: CBC:  Recent Labs  04/25/16 2306  WBC 6.5  HGB 13.3  HCT 39.1  PLT 191   CMET CMP     Component Value Date/Time   NA 139 04/26/2016 0616   NA 140 08/14/2012 1513   K 3.7 04/26/2016 0616   K 4.3 08/14/2012 1513   CL 103 04/26/2016 0616   CO2 29 04/26/2016 0616   CO2 27 08/14/2012 1513   GLUCOSE 103 (H) 04/26/2016 0616   GLUCOSE 262 (H) 08/14/2012 1513   BUN 16 04/26/2016 0616   BUN 19.0 08/14/2012 1513   CREATININE 1.08 04/26/2016 0616   CREATININE 1.29 03/19/2014 1130   CREATININE 1.3 08/14/2012 1513   CALCIUM 8.8 (L) 04/26/2016 0616   CALCIUM 9.3 08/14/2012 1513   PROT  7.3 04/25/2016 2306   PROT 6.9 08/14/2012 1513   ALBUMIN 4.0 04/25/2016 2306   ALBUMIN 3.4 (L) 08/14/2012 1513   AST 26 04/25/2016 2306   AST 15 08/14/2012 1513   ALT 26 04/25/2016 2306   ALT 14 08/14/2012 1513   ALKPHOS 61 04/25/2016 2306   ALKPHOS 92 08/14/2012 1513   BILITOT 0.7 04/25/2016 2306  BILITOT 0.39 08/14/2012 1513   GFRNONAA >60 04/26/2016 0616   GFRNONAA 67 05/19/2013 1108   GFRAA >60 04/26/2016 0616   GFRAA 78 05/19/2013 1108    GFR Estimated Creatinine Clearance: 59.3 mL/min (by C-G formula based on SCr of 1.08 mg/dL). No results for input(s): LIPASE, AMYLASE in the last 72 hours.  Recent Labs  04/25/16 2306  TROPONINI <0.03   Invalid input(s): POCBNP No results for input(s): DDIMER in the last 72 hours.  Recent Labs  04/26/16 0616  HGBA1C 9.2*   No results for input(s): CHOL, HDL, LDLCALC, TRIG, CHOLHDL, LDLDIRECT in the last 72 hours. No results for input(s): TSH, T4TOTAL, T3FREE, THYROIDAB in the last 72 hours.  Invalid input(s): FREET3 No results for input(s): VITAMINB12, FOLATE, FERRITIN, TIBC, IRON, RETICCTPCT in the last 72 hours. Coags: No results for input(s): INR in the last 72 hours.  Invalid input(s): PT Microbiology: Recent Results (from the past 240 hour(s))  Urine culture     Status: Abnormal (Preliminary result)   Collection Time: 04/26/16 12:33 AM  Result Value Ref Range Status   Specimen Description URINE, RANDOM  Final   Special Requests NONE  Final   Culture >=100,000 COLONIES/mL ENTEROCOCCUS FAECALIS (A)  Final   Report Status PENDING  Incomplete    FURTHER DISCHARGE INSTRUCTIONS:  Get Medicines reviewed and adjusted: Please take all your medications with you for your next visit with your Primary MD  Laboratory/radiological data: Please request your Primary MD to go over all hospital tests and procedure/radiological results at the follow up, please ask your Primary MD to get all Hospital records sent to his/her  office.  In some cases, they will be blood work, cultures and biopsy results pending at the time of your discharge. Please request that your primary care M.D. goes through all the records of your hospital data and follows up on these results.  Also Note the following: If you experience worsening of your admission symptoms, develop shortness of breath, life threatening emergency, suicidal or homicidal thoughts you must seek medical attention immediately by calling 911 or calling your MD immediately  if symptoms less severe.  You must read complete instructions/literature along with all the possible adverse reactions/side effects for all the Medicines you take and that have been prescribed to you. Take any new Medicines after you have completely understood and accpet all the possible adverse reactions/side effects.   Do not drive when taking Pain medications or sleeping medications (Benzodaizepines)  Do not take more than prescribed Pain, Sleep and Anxiety Medications. It is not advisable to combine anxiety,sleep and pain medications without talking with your primary care practitioner  Special Instructions: If you have smoked or chewed Tobacco  in the last 2 yrs please stop smoking, stop any regular Alcohol  and or any Recreational drug use.  Wear Seat belts while driving.  Please note: You were cared for by a hospitalist during your hospital stay. Once you are discharged, your primary care physician will handle any further medical issues. Please note that NO REFILLS for any discharge medications will be authorized once you are discharged, as it is imperative that you return to your primary care physician (or establish a relationship with a primary care physician if you do not have one) for your post hospital discharge needs so that they can reassess your need for medications and monitor your lab values.  Total Time spent coordinating discharge including counseling, education and face to face time  equals 35 minutes.  Signed: Dispensing optician  Yameli Delamater 04/27/2016 11:29 AM

## 2016-04-28 LAB — URINE CULTURE: Culture: >=100000 — AB

## 2016-05-05 DIAGNOSIS — M549 Dorsalgia, unspecified: Secondary | ICD-10-CM | POA: Diagnosis not present

## 2016-05-05 DIAGNOSIS — N390 Urinary tract infection, site not specified: Secondary | ICD-10-CM | POA: Diagnosis not present

## 2016-05-05 DIAGNOSIS — R899 Unspecified abnormal finding in specimens from other organs, systems and tissues: Secondary | ICD-10-CM | POA: Diagnosis not present

## 2016-05-09 DIAGNOSIS — R3121 Asymptomatic microscopic hematuria: Secondary | ICD-10-CM | POA: Diagnosis not present

## 2016-05-09 DIAGNOSIS — N410 Acute prostatitis: Secondary | ICD-10-CM | POA: Diagnosis not present

## 2016-05-10 ENCOUNTER — Ambulatory Visit: Payer: Medicare Other | Admitting: Interventional Cardiology

## 2016-05-12 ENCOUNTER — Ambulatory Visit: Payer: Medicare Other | Admitting: Interventional Cardiology

## 2016-05-15 ENCOUNTER — Encounter: Payer: Self-pay | Admitting: Adult Health

## 2016-05-17 ENCOUNTER — Ambulatory Visit (INDEPENDENT_AMBULATORY_CARE_PROVIDER_SITE_OTHER): Payer: Medicare Other | Admitting: Adult Health

## 2016-05-17 ENCOUNTER — Encounter: Payer: Self-pay | Admitting: Adult Health

## 2016-05-17 VITALS — BP 121/64 | HR 62 | Ht 65.0 in | Wt 187.4 lb

## 2016-05-17 DIAGNOSIS — I63012 Cerebral infarction due to thrombosis of left vertebral artery: Secondary | ICD-10-CM

## 2016-05-17 DIAGNOSIS — G4733 Obstructive sleep apnea (adult) (pediatric): Secondary | ICD-10-CM

## 2016-05-17 DIAGNOSIS — Z9989 Dependence on other enabling machines and devices: Secondary | ICD-10-CM | POA: Diagnosis not present

## 2016-05-17 NOTE — Progress Notes (Signed)
I agree with the assessment and plan as directed by NP . He has longstanding compliance problems with CPAP. The patient is known to me .   Kullen Tomasetti, MD

## 2016-05-17 NOTE — Patient Instructions (Signed)
Continue using CPAP nightly >4 hours each night If your symptoms worsen or you develop new symptoms please let us know.

## 2016-05-17 NOTE — Progress Notes (Signed)
PATIENT: Steve Brown DOB: [birth year]  REASON FOR VISIT: follow up- [facility] on cpap, memory trouble HISTORY FROM: patient  HISTORY OF PRESENT ILLNESS: Mr. Panning is a 76 year old male with a history of stroke, memory disturbance and obstructive sleep apnea on CPAP. He returns today for a compliance download. The patient had a repeat sleep study that shows severe sleep apnea and was placed on CPAP therapy. The patient received his CPAP April 20 therefore we're unable to obtain a full 30 day download. His download today indicates that he uses machine 16/18 days. He uses machine greater than 4 hours 12 out of 18 days. On average he uses his machine for 5 hours and 29 minutes. His residual AHI is 2.3 on 13 cm water with EPR of 3. His leak in the 95th percentile was 48.9%. He states that the mask typically leaks when he lays on his side. Although according to the graph there are several nights that he does very well and does not have a significant leak. His Epworth sleepiness score is 6 and fatigue severity score is 33. He states that he does wake up 3-4 times a night to urinate and he always puts the mask back on when he returns to bed. He does state that in April he had a syncopal episode. He was taken to the emergency room. His glucose levels were over 200 and he also had a urinary tract infection. He has followed with his primary care and has appointment with his endocrinologist coming up. He also will follow with his cardiologist on Friday. He returns today for an evaluation.   HISTORY 02/21/16 Copied From Dr. Edwena Felty notes: This patient has been established as a sleep patient in our clinic since 2013, has a history of multi-infarct, hypertension, diabetes and was evaluated for neuropathy in 2015, was followed for CPAP compliance until last May when he was no longer compliant due to repeated sinus infections, hospitalizations etc. He stated today that he has not been able to reinitiate CPAP use  since March 2017.   his primary care physician, Dr. Harrington Challenger, referred now for memory concerns, in a patient with known multi-infarct history but with a sliding result on his minimal mental status examinations. A note from January 5 of this year states that the patient had scored 26 out of 29 points in a high school educated African-American gentleman age 64. The patient had suffered multiple strokes and ongoing TIA events, image studies were quoted below. I used today a [place]ral cognitive assessment and he scored 24 out of 30 points 2 points less  Expected. The details of the exam results are copied to the physical exam note. He did particularly well with the trail making test but had difficulties with clock drawing and copying an image. His short-term memory was excellent 5 out of 5 words, he had trouble with serial 7- but he had a lifelong difficulties with mathematics all through HS.   REVIEW OF SYSTEMS: Out of a complete 14 system review of symptoms, the patient complains only of the following symptoms, and all other reviewed systems are negative.  Neck pain, neck stiffness, numbness, joint pain, apnea, snoring  ALLERGIES: Allergies  Allergen Reactions  . Other Other (See Comments)    Per patient- cardiac cath dye-  "woke up during procedure hysterical."  . Phenergan [Promethazine] Other (See Comments)    Mood changes     HOME MEDICATIONS: Outpatient Medications Prior to Visit  Medication Sig Dispense Refill  . amoxicillin (  AMOXIL) 500 MG capsule Take 1 capsule (500 mg total) by mouth every 8 (eight) hours. 21 capsule 0  . Ascorbic Acid (VITAMIN C PO) Take 1 tablet by mouth daily.    Marland Kitchen aspirin EC 81 MG tablet Take 1 tablet (81 mg total) by mouth daily.    . BD PEN NEEDLE NANO U/F 32G X 4 MM MISC by Does not apply route.     . bisacodyl (DULCOLAX) 5 MG EC tablet Take 5 mg by mouth daily as needed for moderate constipation.    . cholecalciferol (VITAMIN D) 1000 UNITS tablet Take 1,000 Units  by mouth daily.    Marland Kitchen dutasteride (AVODART) 0.5 MG capsule Take 0.5 mg by mouth daily.    . furosemide (LASIX) 20 MG tablet Take 20 mg by mouth daily as needed (swelling).     Marland Kitchen HUMALOG MIX 75/25 KWIKPEN (75-25) 100 UNIT/ML Kwikpen Inject 18-20 Units into the skin 2 (two) times daily. 18 units in evening, and 20 units in the morning.  1  . memantine (NAMENDA) 10 MG tablet Take 1 tablet (10 mg total) by mouth 2 (two) times daily. 60 tablet 3  . metFORMIN (GLUCOPHAGE-XR) 500 MG 24 hr tablet Take 500 mg by mouth 2 (two) times daily.    . nitroGLYCERIN (NITROSTAT) 0.4 MG SL tablet Place 1 tablet (0.4 mg total) under the tongue every 5 (five) minutes as needed for chest pain. 25 tablet 12  . pantoprazole (PROTONIX) 40 MG tablet Take 40 mg by mouth daily.    . pregabalin (LYRICA) 300 MG capsule Take 1 capsule (300 mg total) by mouth 2 (two) times daily. 180 capsule 3  . ramipril (ALTACE) 2.5 MG capsule Take 2.5 mg by mouth daily.  1  . rivaroxaban (XARELTO) 20 MG TABS tablet Take 20 mg by mouth daily.     . rosuvastatin (CRESTOR) 10 MG tablet Take 10 mg by mouth every evening.     . valACYclovir (VALTREX) 500 MG tablet Take 500 mg by mouth daily.     No facility-administered medications prior to visit.     PAST MEDICAL HISTORY: Past Medical History:  Diagnosis Date  . Benign localized hyperplasia of prostate without urinary obstruction and other lower urinary tract symptoms (LUTS)   . Blood dyscrasia   . CKD (chronic kidney disease), stage II   . Coronary artery disease   . Degeneration of cervical intervertebral disc   . Depression   . Diabetes mellitus   . Dyspnea    with exertion  . GERD (gastroesophageal reflux disease)   . History of TIAs   . Hypersomnia with sleep apnea, unspecified   . Hypertension   . Lumbago   . Neuropathy   . Other and unspecified hyperlipidemia   . Pain in joint, multiple sites   . PE (pulmonary thromboembolism) ([place])   . Personal history of unspecified  circulatory disease   . Pneumonia 2005   2010  . Sleep apnea   . Stroke [facility]) 2002, 2003   both sided weakness  . Syncope 04/25/2016  . Trigger finger of both hands 11-17-13  . Type II or unspecified type diabetes mellitus without mention of complication, not stated as uncontrolled   . Unspecified cardiovascular disease   . Unspecified essential hypertension   . Unspecified fall   . Unsteady gait 05/19/2013   X 24 hours - improving. Hx prior CVA/TIAs; multiple risk factors; on Xarelto    PAST SURGICAL HISTORY: Past Surgical History:  Procedure Laterality Date  .  CARDIAC CATHETERIZATION    . CARPAL TUNNEL RELEASE     bilateral  . CERVICAL DISC SURGERY    . CHOLECYSTECTOMY N/A 11/02/2015   Procedure: LAPAROSCOPIC CHOLECYSTECTOMY WITH INTRAOPERATIVE CHOLANGIOGRAM;  Surgeon: Donnie Mesa, MD;  Location: [facility];  Service: General;  Laterality: N/A;  . COLONOSCOPY    . CORONARY ANGIOPLASTY    . CORONARY STENT PLACEMENT  Feb. 2010  . ELBOW SURGERY     right elbow - nerve release  . EYE SURGERY Bilateral    cataract  . HERNIA REPAIR     umbicical  . LEFT HEART CATHETERIZATION WITH CORONARY ANGIOGRAM N/A 04/03/2013   Procedure: LEFT HEART CATHETERIZATION WITH CORONARY ANGIOGRAM;  Surgeon: Jettie Booze, MD;  Location: [facility] CATH LAB;  Service: Cardiovascular;  Laterality: N/A;  . lungs     "fluid pumped off lungs"    FAMILY HISTORY: Family History  Problem Relation Age of Onset  . Aneurysm Mother   . Cancer Father   . Heart attack Neg Hx     SOCIAL HISTORY: Social History   Social History  . Marital status: Divorced    Spouse name: N/A  . Number of children: 4  . Years of education: 12   Occupational History  .  Retired    retired   Social History Main Topics  . Smoking status: Never Smoker  . Smokeless tobacco: Never Used  . Alcohol use No  . Drug use: No  . Sexual activity: Not on file   Other Topics Concern  . Not on file   Social History Narrative    Patient lives at home alone and he is single.  Patient is retired.    Caffeine - one cups daily.   Right handed.   Patient has a high school education.   Patient has four adult children.      PHYSICAL EXAM  Vitals:   05/17/16 1100  BP: 121/64  Pulse: 62  Weight: 187 lb 6.4 oz (85 kg)  Height: 5\' 5"  (1.651 m)   Body mass index is 31.18 kg/m.  Generalized: Well developed, in no acute distress   Neurological examination  Mentation: Alert oriented to time, place, history taking. Follows all commands speech and language fluent Cranial nerve II-XII: Pupils were equal round reactive to light. Extraocular movements were full, visual field were full on confrontational test. Facial sensation and strength were normal. Uvula tongue midline. Head turning and shoulder shrug  were normal and symmetric. Neck circumference 16.5 inches, Mallampati 3+ Motor: The motor testing reveals 5 over 5 strength of all 4 extremities. Good symmetric motor tone is noted throughout.  Sensory: Sensory testing is intact to soft touch on all 4 extremities. No evidence of extinction is noted.  Coordination: Cerebellar testing reveals good finger-nose-finger and heel-to-shin bilaterally.  Gait and station: Gait is normal. Tandem gait is normal. Romberg is negative. No drift is seen.  Reflexes: Deep tendon reflexes are symmetric and normal bilaterally.   DIAGNOSTIC DATA (LABS, IMAGING, TESTING) - I reviewed patient records, labs, notes, testing and imaging myself where available.  Lab Results  Component Value Date   WBC 6.5 04/25/2016   HGB 13.3 04/25/2016   HCT 39.1 04/25/2016   MCV 92.4 04/25/2016   PLT 191 04/25/2016      Component Value Date/Time   NA 139 04/26/2016 0616   NA 140 08/14/2012 1513   K 3.7 04/26/2016 0616   K 4.3 08/14/2012 1513   CL 103 04/26/2016 0616   CO2  29 04/26/2016 0616   CO2 27 08/14/2012 1513   GLUCOSE 103 (H) 04/26/2016 0616   GLUCOSE 262 (H) 08/14/2012 1513   BUN 16  04/26/2016 0616   BUN 19.0 08/14/2012 1513   CREATININE 1.08 04/26/2016 0616   CREATININE 1.29 03/19/2014 1130   CREATININE 1.3 08/14/2012 1513   CALCIUM 8.8 (L) 04/26/2016 0616   CALCIUM 9.3 08/14/2012 1513   PROT 7.3 04/25/2016 2306   PROT 6.9 08/14/2012 1513   ALBUMIN 4.0 04/25/2016 2306   ALBUMIN 3.4 (L) 08/14/2012 1513   AST 26 04/25/2016 2306   AST 15 08/14/2012 1513   ALT 26 04/25/2016 2306   ALT 14 08/14/2012 1513   ALKPHOS 61 04/25/2016 2306   ALKPHOS 92 08/14/2012 1513   BILITOT 0.7 04/25/2016 2306   BILITOT 0.39 08/14/2012 1513   GFRNONAA >60 04/26/2016 0616   GFRNONAA 67 05/19/2013 1108   GFRAA >60 04/26/2016 0616   GFRAA 78 05/19/2013 1108   Lab Results  Component Value Date   CHOL 144 03/21/2013   HDL 57 03/21/2013   LDLCALC 63 03/21/2013   TRIG 122 03/21/2013   CHOLHDL 2.5 03/21/2013   Lab Results  Component Value Date   HGBA1C 9.2 (H) 04/26/2016    Lab Results  Component Value Date   TSH 0.618 03/20/2013      ASSESSMENT AND PLAN 76 y.o. year old male  has a past medical history of Benign localized hyperplasia of prostate without urinary obstruction and other lower urinary tract symptoms (LUTS); Blood dyscrasia; CKD (chronic kidney disease), stage II; Coronary artery disease; Degeneration of cervical intervertebral disc; Depression; Diabetes mellitus; Dyspnea; GERD (gastroesophageal reflux disease); History of TIAs; Hypersomnia with sleep apnea, unspecified; Hypertension; Lumbago; Neuropathy; Other and unspecified hyperlipidemia; Pain in joint, multiple sites; PE (pulmonary thromboembolism) ([place]); Personal history of unspecified circulatory disease; Pneumonia (2005); Sleep apnea; Stroke [facility]) (2002, 2003); Syncope (04/25/2016); Trigger finger of both hands (11-17-13); Type II or unspecified type diabetes mellitus without mention of complication, not stated as uncontrolled; Unspecified cardiovascular disease; Unspecified essential hypertension; Unspecified  fall; and Unsteady gait (05/19/2013). here with:  1. Obstructive sleep apnea on CPAP  The patient does have good compliance with his CPAP since he received a. I've encouraged him to continue using it nightly and for greater than 4 hours each night. He should also make sure his straps are tight to ensure that the mask does not leak. Advised that if his mask continues to leak he should let us know. We may have to get him back in for a mask refit. Patient voiced understanding. He will keep appointment in July with Dr. Brett Fairy. At that if he has any additional syncopal episodes he should let us know.  I spent 15 minutes with the patient 50% of this time was spent reviewing the patient's CPAP download.   Ward Givens, MSN, NP-C 05/17/2016, 10:54 AM [facility] [address] [place], [place] [address] [phone]

## 2016-05-18 ENCOUNTER — Encounter (HOSPITAL_COMMUNITY): Payer: Self-pay | Admitting: Family Medicine

## 2016-05-18 ENCOUNTER — Observation Stay (HOSPITAL_COMMUNITY): Payer: Medicare Other

## 2016-05-18 ENCOUNTER — Emergency Department (HOSPITAL_COMMUNITY): Payer: Medicare Other

## 2016-05-18 ENCOUNTER — Observation Stay (HOSPITAL_COMMUNITY)
Admission: EM | Admit: 2016-05-18 | Discharge: 2016-05-19 | Disposition: A | Discharge status: Home / Self Care | Payer: Medicare Other | Attending: Family Medicine | Admitting: Family Medicine

## 2016-05-18 DIAGNOSIS — R739 Hyperglycemia, unspecified: Secondary | ICD-10-CM | POA: Diagnosis not present

## 2016-05-18 DIAGNOSIS — N281 Cyst of kidney, acquired: Secondary | ICD-10-CM | POA: Insufficient documentation

## 2016-05-18 DIAGNOSIS — R55 Syncope and collapse: Secondary | ICD-10-CM | POA: Diagnosis not present

## 2016-05-18 DIAGNOSIS — R404 Transient alteration of awareness: Secondary | ICD-10-CM | POA: Diagnosis not present

## 2016-05-18 DIAGNOSIS — Z794 Long term (current) use of insulin: Secondary | ICD-10-CM

## 2016-05-18 DIAGNOSIS — K219 Gastro-esophageal reflux disease without esophagitis: Secondary | ICD-10-CM | POA: Insufficient documentation

## 2016-05-18 DIAGNOSIS — Z9989 Dependence on other enabling machines and devices: Secondary | ICD-10-CM

## 2016-05-18 DIAGNOSIS — I951 Orthostatic hypotension: Secondary | ICD-10-CM | POA: Diagnosis present

## 2016-05-18 DIAGNOSIS — E875 Hyperkalemia: Secondary | ICD-10-CM | POA: Insufficient documentation

## 2016-05-18 DIAGNOSIS — Z86711 Personal history of pulmonary embolism: Secondary | ICD-10-CM

## 2016-05-18 DIAGNOSIS — I272 Pulmonary hypertension, unspecified: Secondary | ICD-10-CM | POA: Diagnosis not present

## 2016-05-18 DIAGNOSIS — N390 Urinary tract infection, site not specified: Secondary | ICD-10-CM | POA: Diagnosis not present

## 2016-05-18 DIAGNOSIS — N182 Chronic kidney disease, stage 2 (mild): Secondary | ICD-10-CM | POA: Diagnosis not present

## 2016-05-18 DIAGNOSIS — E1165 Type 2 diabetes mellitus with hyperglycemia: Secondary | ICD-10-CM | POA: Diagnosis not present

## 2016-05-18 DIAGNOSIS — F329 Major depressive disorder, single episode, unspecified: Secondary | ICD-10-CM | POA: Diagnosis not present

## 2016-05-18 DIAGNOSIS — R2681 Unsteadiness on feet: Secondary | ICD-10-CM | POA: Insufficient documentation

## 2016-05-18 DIAGNOSIS — E860 Dehydration: Secondary | ICD-10-CM | POA: Diagnosis not present

## 2016-05-18 DIAGNOSIS — E118 Type 2 diabetes mellitus with unspecified complications: Secondary | ICD-10-CM

## 2016-05-18 DIAGNOSIS — E1122 Type 2 diabetes mellitus with diabetic chronic kidney disease: Secondary | ICD-10-CM | POA: Insufficient documentation

## 2016-05-18 DIAGNOSIS — N323 Diverticulum of bladder: Secondary | ICD-10-CM | POA: Insufficient documentation

## 2016-05-18 DIAGNOSIS — I129 Hypertensive chronic kidney disease with stage 1 through stage 4 chronic kidney disease, or unspecified chronic kidney disease: Secondary | ICD-10-CM | POA: Insufficient documentation

## 2016-05-18 DIAGNOSIS — G4733 Obstructive sleep apnea (adult) (pediatric): Secondary | ICD-10-CM

## 2016-05-18 DIAGNOSIS — Z8679 Personal history of other diseases of the circulatory system: Secondary | ICD-10-CM | POA: Diagnosis not present

## 2016-05-18 DIAGNOSIS — R42 Dizziness and giddiness: Secondary | ICD-10-CM | POA: Insufficient documentation

## 2016-05-18 DIAGNOSIS — N179 Acute kidney failure, unspecified: Secondary | ICD-10-CM | POA: Diagnosis not present

## 2016-05-18 DIAGNOSIS — J329 Chronic sinusitis, unspecified: Secondary | ICD-10-CM | POA: Diagnosis not present

## 2016-05-18 DIAGNOSIS — Z7982 Long term (current) use of aspirin: Secondary | ICD-10-CM | POA: Insufficient documentation

## 2016-05-18 DIAGNOSIS — Z955 Presence of coronary angioplasty implant and graft: Secondary | ICD-10-CM | POA: Insufficient documentation

## 2016-05-18 DIAGNOSIS — I69359 Hemiplegia and hemiparesis following cerebral infarction affecting unspecified side: Secondary | ICD-10-CM | POA: Insufficient documentation

## 2016-05-18 DIAGNOSIS — Z7901 Long term (current) use of anticoagulants: Secondary | ICD-10-CM | POA: Insufficient documentation

## 2016-05-18 DIAGNOSIS — R531 Weakness: Secondary | ICD-10-CM | POA: Diagnosis not present

## 2016-05-18 DIAGNOSIS — I700 Atherosclerosis of aorta: Secondary | ICD-10-CM | POA: Diagnosis not present

## 2016-05-18 DIAGNOSIS — E782 Mixed hyperlipidemia: Secondary | ICD-10-CM | POA: Diagnosis not present

## 2016-05-18 DIAGNOSIS — M5030 Other cervical disc degeneration, unspecified cervical region: Secondary | ICD-10-CM | POA: Diagnosis not present

## 2016-05-18 DIAGNOSIS — N400 Enlarged prostate without lower urinary tract symptoms: Secondary | ICD-10-CM | POA: Diagnosis not present

## 2016-05-18 DIAGNOSIS — I2510 Atherosclerotic heart disease of native coronary artery without angina pectoris: Secondary | ICD-10-CM | POA: Diagnosis not present

## 2016-05-18 DIAGNOSIS — G8190 Hemiplegia, unspecified affecting unspecified side: Secondary | ICD-10-CM

## 2016-05-18 LAB — RAPID URINE DRUG SCREEN, HOSP PERFORMED
Amphetamines: NOT DETECTED
Barbiturates: NOT DETECTED
Benzodiazepines: NOT DETECTED
Cocaine: NOT DETECTED
Opiates: NOT DETECTED
Tetrahydrocannabinol: NOT DETECTED

## 2016-05-18 LAB — COMPREHENSIVE METABOLIC PANEL
ALT: 22 U/L (ref 17–63)
AST: 26 U/L (ref 15–41)
Albumin: 3.7 g/dL (ref 3.5–5.0)
Alkaline Phosphatase: 72 U/L (ref 38–126)
Anion gap: 7 (ref 5–15)
BUN: 24 mg/dL — ABNORMAL HIGH (ref 6–20)
CO2: 23 mmol/L (ref 22–32)
Calcium: 8.6 mg/dL — ABNORMAL LOW (ref 8.9–10.3)
Chloride: 102 mmol/L (ref 101–111)
Creatinine, Ser: 2.03 mg/dL — ABNORMAL HIGH (ref 0.61–1.24)
GFR calc Af Amer: 35 mL/min — ABNORMAL LOW (ref >60)
GFR calc non Af Amer: 31 mL/min — ABNORMAL LOW (ref >60)
Glucose, Bld: 195 mg/dL — ABNORMAL HIGH (ref 65–99)
Potassium: 5 mmol/L (ref 3.5–5.1)
Sodium: 132 mmol/L — ABNORMAL LOW (ref 135–145)
Total Bilirubin: 0.5 mg/dL (ref 0.3–1.2)
Total Protein: 6.4 g/dL — ABNORMAL LOW (ref 6.5–8.1)

## 2016-05-18 LAB — I-STAT TROPONIN, ED: Troponin i, poc: 0 ng/mL (ref 0.00–0.08)

## 2016-05-18 LAB — CBC WITH DIFFERENTIAL/PLATELET
Basophils Absolute: 0 10*3/uL (ref 0.0–0.1)
Basophils Relative: 1 %
Eosinophils Absolute: 0.1 10*3/uL (ref 0.0–0.7)
Eosinophils Relative: 1 %
HCT: 35.9 % — ABNORMAL LOW (ref 39.0–52.0)
Hemoglobin: 12.3 g/dL — ABNORMAL LOW (ref 13.0–17.0)
Lymphocytes Relative: 30 %
Lymphs Abs: 1.8 10*3/uL (ref 0.7–4.0)
MCH: 31.2 pg (ref 26.0–34.0)
MCHC: 34.3 g/dL (ref 30.0–36.0)
MCV: 91.1 fL (ref 78.0–100.0)
Monocytes Absolute: 0.4 10*3/uL (ref 0.1–1.0)
Monocytes Relative: 7 %
Neutro Abs: 3.8 10*3/uL (ref 1.7–7.7)
Neutrophils Relative %: 61 %
Platelets: 184 10*3/uL (ref 150–400)
RBC: 3.94 MIL/uL — ABNORMAL LOW (ref 4.22–5.81)
RDW: 13.3 % (ref 11.5–15.5)
WBC: 6.1 10*3/uL (ref 4.0–10.5)

## 2016-05-18 LAB — URINALYSIS, ROUTINE W REFLEX MICROSCOPIC
Bilirubin Urine: NEGATIVE
Glucose, UA: NEGATIVE mg/dL
Hgb urine dipstick: NEGATIVE
Ketones, ur: NEGATIVE mg/dL
Leukocytes, UA: NEGATIVE
Nitrite: NEGATIVE
Protein, ur: NEGATIVE mg/dL
Specific Gravity, Urine: 1.009 (ref 1.005–1.030)
pH: 5 (ref 5.0–8.0)

## 2016-05-18 LAB — CBG MONITORING, ED: Glucose-Capillary: 151 mg/dL — ABNORMAL HIGH (ref 65–99)

## 2016-05-18 LAB — ETHANOL: Alcohol, Ethyl (B): <5 mg/dL (ref <5)

## 2016-05-18 LAB — GLUCOSE, CAPILLARY: Glucose-Capillary: 266 mg/dL — ABNORMAL HIGH (ref 65–99)

## 2016-05-18 MED ORDER — INSULIN ASPART PROT & ASPART (70-30 MIX) 100 UNIT/ML ~~LOC~~ SUSP
15.0000 [IU] | Freq: Two times a day (BID) | SUBCUTANEOUS | Status: DC
  Administered 2016-05-18 – 2016-05-19 (×3): 15 [IU] via SUBCUTANEOUS
  Filled 2016-05-18: qty 10

## 2016-05-18 MED ORDER — INSULIN ASPART 100 UNIT/ML ~~LOC~~ SOLN
0.0000 [IU] | Freq: Every day | SUBCUTANEOUS | Status: DC
  Administered 2016-05-18: 3 [IU] via SUBCUTANEOUS

## 2016-05-18 MED ORDER — SODIUM CHLORIDE 0.9 % IV BOLUS (SEPSIS)
1000.0000 mL | Freq: Once | INTRAVENOUS | Status: AC
  Administered 2016-05-18: 1000 mL via INTRAVENOUS

## 2016-05-18 MED ORDER — SODIUM CHLORIDE 0.9 % IV SOLN
INTRAVENOUS | Status: AC
  Administered 2016-05-18 – 2016-05-19 (×2): via INTRAVENOUS

## 2016-05-18 MED ORDER — ACETAMINOPHEN 650 MG RE SUPP: 650.0000 mg | Freq: Four times a day (QID) | RECTAL | Status: DC | PRN

## 2016-05-18 MED ORDER — BISACODYL 5 MG PO TBEC: 5.0000 mg | DELAYED_RELEASE_TABLET | Freq: Every day | ORAL | Status: DC | PRN

## 2016-05-18 MED ORDER — SODIUM CHLORIDE 0.9% FLUSH: 3.0000 mL | Freq: Two times a day (BID) | INTRAVENOUS | Status: DC

## 2016-05-18 MED ORDER — INSULIN ASPART 100 UNIT/ML ~~LOC~~ SOLN
0.0000 [IU] | Freq: Three times a day (TID) | SUBCUTANEOUS | Status: DC
Start: 2016-05-19 — End: 2016-05-19
  Administered 2016-05-19 (×3): 2 [IU] via SUBCUTANEOUS

## 2016-05-18 MED ORDER — RIVAROXABAN 15 MG PO TABS: 15.0000 mg | ORAL_TABLET | Freq: Every day | ORAL | Status: DC

## 2016-05-18 MED ORDER — SODIUM CHLORIDE 0.9 % IV SOLN
Freq: Once | INTRAVENOUS | Status: AC
  Administered 2016-05-18: 16:00:00 via INTRAVENOUS

## 2016-05-18 MED ORDER — SULFAMETHOXAZOLE-TRIMETHOPRIM 800-160 MG PO TABS
1.0000 | ORAL_TABLET | Freq: Two times a day (BID) | ORAL | Status: DC
  Administered 2016-05-18 – 2016-05-19 (×2): 1 via ORAL
  Filled 2016-05-18 (×2): qty 1

## 2016-05-18 MED ORDER — VALACYCLOVIR HCL 500 MG PO TABS
500.0000 mg | ORAL_TABLET | Freq: Every day | ORAL | Status: DC
  Administered 2016-05-19: 500 mg via ORAL
  Filled 2016-05-18: qty 1

## 2016-05-18 MED ORDER — VITAMIN C 500 MG PO TABS
500.0000 mg | ORAL_TABLET | Freq: Every day | ORAL | Status: DC
  Administered 2016-05-19: 500 mg via ORAL
  Filled 2016-05-18: qty 1

## 2016-05-18 MED ORDER — ASPIRIN EC 81 MG PO TBEC
81.0000 mg | DELAYED_RELEASE_TABLET | Freq: Every day | ORAL | Status: DC
  Administered 2016-05-19: 81 mg via ORAL
  Filled 2016-05-18: qty 1

## 2016-05-18 MED ORDER — ROSUVASTATIN CALCIUM 10 MG PO TABS
10.0000 mg | ORAL_TABLET | Freq: Every evening | ORAL | Status: DC
  Administered 2016-05-18 – 2016-05-19 (×2): 10 mg via ORAL
  Filled 2016-05-18 (×2): qty 1

## 2016-05-18 MED ORDER — DOCUSATE SODIUM 100 MG PO CAPS
100.0000 mg | ORAL_CAPSULE | Freq: Two times a day (BID) | ORAL | Status: DC
  Administered 2016-05-18 – 2016-05-19 (×2): 100 mg via ORAL
  Filled 2016-05-18 (×2): qty 1

## 2016-05-18 MED ORDER — ONDANSETRON HCL 4 MG/2ML IJ SOLN: 4.0000 mg | Freq: Four times a day (QID) | INTRAMUSCULAR | Status: DC | PRN

## 2016-05-18 MED ORDER — MEMANTINE HCL 10 MG PO TABS
10.0000 mg | ORAL_TABLET | Freq: Two times a day (BID) | ORAL | Status: DC
  Administered 2016-05-18 – 2016-05-19 (×2): 10 mg via ORAL
  Filled 2016-05-18 (×2): qty 1

## 2016-05-18 MED ORDER — TAMSULOSIN HCL 0.4 MG PO CAPS
0.4000 mg | ORAL_CAPSULE | Freq: Every day | ORAL | Status: DC
  Administered 2016-05-19: 0.4 mg via ORAL
  Filled 2016-05-18: qty 1

## 2016-05-18 MED ORDER — VITAMIN D 1000 UNITS PO TABS
1000.0000 [IU] | ORAL_TABLET | Freq: Every day | ORAL | Status: DC
  Administered 2016-05-19: 1000 [IU] via ORAL
  Filled 2016-05-18: qty 1

## 2016-05-18 MED ORDER — PREGABALIN 100 MG PO CAPS
300.0000 mg | ORAL_CAPSULE | Freq: Two times a day (BID) | ORAL | Status: DC
  Administered 2016-05-18 – 2016-05-19 (×2): 300 mg via ORAL
  Filled 2016-05-18 (×2): qty 3

## 2016-05-18 MED ORDER — NITROGLYCERIN 0.4 MG SL SUBL: 0.4000 mg | SUBLINGUAL_TABLET | SUBLINGUAL | Status: DC | PRN

## 2016-05-18 MED ORDER — PANTOPRAZOLE SODIUM 40 MG PO TBEC
40.0000 mg | DELAYED_RELEASE_TABLET | Freq: Every day | ORAL | Status: DC
  Administered 2016-05-19: 40 mg via ORAL
  Filled 2016-05-18: qty 1

## 2016-05-18 MED ORDER — DUTASTERIDE 0.5 MG PO CAPS
0.5000 mg | ORAL_CAPSULE | Freq: Every day | ORAL | Status: DC
  Administered 2016-05-19: 0.5 mg via ORAL
  Filled 2016-05-18: qty 1

## 2016-05-18 MED ORDER — ACETAMINOPHEN 325 MG PO TABS: 650.0000 mg | ORAL_TABLET | Freq: Four times a day (QID) | ORAL | Status: DC | PRN

## 2016-05-18 MED ORDER — ONDANSETRON HCL 4 MG PO TABS: 4.0000 mg | ORAL_TABLET | Freq: Four times a day (QID) | ORAL | Status: DC | PRN

## 2016-05-18 NOTE — ED Provider Notes (Signed)
[facility] [place] [facility] DEPT Provider Note   CSN: [id number] Arrival date & time: 05/18/16  1319     History   Chief Complaint No chief complaint on file.   HPI Steve Brown is a 76 y.o. male.  HPI   Steve Brown is a 76 y.o. male, with a history of CAD, PE, CKD stage II, HTN, CVA, and DM, presenting to the ED with a near syncopal episode that occurred just prior to arrival. Patient states he was walking around outside setting up for a BBQ. He became hot, sweaty, vision blurred, generalized weakness, and room-spinning dizziness. Patient sat down and then felt like he might lose consciousness. Family members helped the patient stay sitting upright. Patient did not fall.  EMS reports initial BP 77/41 and CBG 232. Patient states he currently feels normal. Patient had a normal breakfast this morning, but only had one bottle of water.   Denies CP, SOB, N/V/D, abdominal pain, fever/chills, numbness or focal weakness, urinary complaints, or any other complaints.   Has an appointment with his cardiologist, [name], on May 30.  Past Medical History:  Diagnosis Date  . Benign localized hyperplasia of prostate without urinary obstruction and other lower urinary tract symptoms (LUTS)   . Blood dyscrasia   . CKD (chronic kidney disease), stage II   . Coronary artery disease   . Degeneration of cervical intervertebral disc   . Depression   . Diabetes mellitus   . Dyspnea    with exertion  . GERD (gastroesophageal reflux disease)   . History of TIAs   . Hypersomnia with sleep apnea, unspecified   . Hypertension   . Lumbago   . Neuropathy   . Other and unspecified hyperlipidemia   . Pain in joint, multiple sites   . PE (pulmonary thromboembolism) ([place])   . Personal history of unspecified circulatory disease   . Pneumonia 2005   2010  . Sleep apnea   . Stroke [facility]) 2002, 2003   both sided weakness  . Syncope 04/25/2016  . Trigger finger of both hands 11-17-13  . Type II or  unspecified type diabetes mellitus without mention of complication, not stated as uncontrolled   . Unspecified cardiovascular disease   . Unspecified essential hypertension   . Unspecified fall   . Unsteady gait 05/19/2013   X 24 hours - improving. Hx prior CVA/TIAs; multiple risk factors; on Xarelto    Patient Active Problem List   Diagnosis Date Noted  . CKD (chronic kidney disease), stage II 04/26/2016  . UTI (urinary tract infection) 04/26/2016  . Encounter for counseling on use of CPAP 11/18/2015  . Chronic cholecystitis with calculus 11/02/2015  . TIA (transient ischemic attack) 11/03/2014  . OSA on CPAP 05/18/2014  . White matter disease 08/14/2013  . OSA (obstructive sleep apnea) 08/14/2013  . Unsteady gait 05/19/2013  . Mixed hyperlipidemia 04/24/2013  . Syncope 03/20/2013  . Chest pain 10/08/2012  . Hypertension   . Stroke ([place])   . History of TIAs   . Lumbago   . Other and unspecified hyperlipidemia   . Personal history of unspecified circulatory disease   . Unspecified fall   . Pain in joint, multiple sites   . Degeneration of cervical intervertebral disc   . Unspecified cardiovascular disease   . History of pulmonary embolism: June 2014,  Takes Xarelto 07/01/2012    Class: History of  . Hemiparesis ([place]) 07/18/2011  . Diabetes mellitus type 2 with complications ([place]) 27/06/2374  . HTN (hypertension) 07/18/2011  .  CAD in native artery 07/18/2011  . History of recurrent TIAs 07/18/2011    Past Surgical History:  Procedure Laterality Date  . CARDIAC CATHETERIZATION    . CARPAL TUNNEL RELEASE     bilateral  . CERVICAL DISC SURGERY    . CHOLECYSTECTOMY N/A 11/02/2015   Procedure: LAPAROSCOPIC CHOLECYSTECTOMY WITH INTRAOPERATIVE CHOLANGIOGRAM;  Surgeon: Donnie Mesa, MD;  Location: [facility];  Service: General;  Laterality: N/A;  . COLONOSCOPY    . CORONARY ANGIOPLASTY    . CORONARY STENT PLACEMENT  Feb. 2010  . ELBOW SURGERY     right elbow - nerve release  .  EYE SURGERY Bilateral    cataract  . HERNIA REPAIR     umbicical  . LEFT HEART CATHETERIZATION WITH CORONARY ANGIOGRAM N/A 04/03/2013   Procedure: LEFT HEART CATHETERIZATION WITH CORONARY ANGIOGRAM;  Surgeon: Jettie Booze, MD;  Location: [facility] CATH LAB;  Service: Cardiovascular;  Laterality: N/A;  . lungs     "fluid pumped off lungs"       Home Medications    Prior to Admission medications   Medication Sig Start Date End Date Taking? Authorizing Provider  amoxicillin (AMOXIL) 500 MG capsule Take 1 capsule (500 mg total) by mouth every 8 (eight) hours. 04/27/16   Ghimire, Henreitta Leber, MD  Ascorbic Acid (VITAMIN C PO) Take 1 tablet by mouth daily.    [provider]  aspirin EC 81 MG tablet Take 1 tablet (81 mg total) by mouth daily. 11/05/14   Nita Sells, MD  BD PEN NEEDLE NANO U/F 32G X 4 MM MISC by Does not apply route.  09/01/13   [provider]  bisacodyl (DULCOLAX) 5 MG EC tablet Take 5 mg by mouth daily as needed for moderate constipation.    [provider]  celecoxib (CELEBREX) 200 MG capsule Take 200 mg by mouth 2 (two) times daily.  05/04/16   [provider]  cholecalciferol (VITAMIN D) 1000 UNITS tablet Take 1,000 Units by mouth daily.    [provider]  dutasteride (AVODART) 0.5 MG capsule Take 0.5 mg by mouth daily.    [provider]  furosemide (LASIX) 20 MG tablet Take 20 mg by mouth daily as needed (swelling).     [provider]  HUMALOG MIX 75/25 KWIKPEN (75-25) 100 UNIT/ML Kwikpen Inject 18-20 Units into the skin 2 (two) times daily. 18 units in evening, and 20 units in the morning. 10/21/13   [provider]  memantine (NAMENDA) 10 MG tablet Take 1 tablet (10 mg total) by mouth 2 (two) times daily. 03/27/16   Dohmeier, Asencion Partridge, MD  metFORMIN (GLUCOPHAGE-XR) 500 MG 24 hr tablet Take 500 mg by mouth 2 (two) times daily. 04/02/16   [provider]  nitroGLYCERIN (NITROSTAT) 0.4 MG SL  tablet Place 1 tablet (0.4 mg total) under the tongue every 5 (five) minutes as needed for chest pain. 12/07/15   Jettie Booze, MD  pantoprazole (PROTONIX) 40 MG tablet Take 40 mg by mouth daily.    [provider]  pregabalin (LYRICA) 300 MG capsule Take 1 capsule (300 mg total) by mouth 2 (two) times daily. 07/26/12   Dohmeier, Asencion Partridge, MD  ramipril (ALTACE) 2.5 MG capsule Take 2.5 mg by mouth daily. 09/08/14   [provider]  rivaroxaban (XARELTO) 20 MG TABS tablet Take 20 mg by mouth daily.     [provider]  rosuvastatin (CRESTOR) 10 MG tablet Take 10 mg by mouth every evening.  [provider]  valACYclovir (VALTREX) 500 MG tablet Take 500 mg by mouth daily.    [provider]    Family History Family History  Problem Relation Age of Onset  . Aneurysm Mother   . Cancer Father   . Heart attack Neg Hx     Social History Social History  Substance Use Topics  . Smoking status: Never Smoker  . Smokeless tobacco: Never Used  . Alcohol use No     Allergies   Other and Phenergan [promethazine]   Review of Systems Review of Systems  Constitutional: Positive for diaphoresis (resolved). Negative for chills and fever.  Respiratory: Negative for shortness of breath.   Cardiovascular: Negative for chest pain.  Gastrointestinal: Negative for abdominal pain, diarrhea, nausea and vomiting.  Neurological: Positive for dizziness, syncope (near-syncope) and weakness. Negative for numbness and headaches.  All other systems reviewed and are negative.    Physical Exam Updated Vital Signs BP 111/60   Pulse 61   Resp 15   SpO2 99%   Physical Exam  Constitutional: He appears well-developed and well-nourished. No distress.  HENT:  Head: Normocephalic and atraumatic.  Mouth/Throat: Oropharynx is clear and moist.  Eyes: Conjunctivae and EOM are normal. Pupils are equal, round, and reactive to light.  Neck: Normal range of motion.  Neck supple.  Cardiovascular: Normal rate, regular rhythm, normal heart sounds and intact distal pulses.   Pulmonary/Chest: Effort normal and breath sounds normal. No respiratory distress.  Abdominal: Soft. There is no tenderness. There is no guarding.  Musculoskeletal: He exhibits no edema.  Normal motor function intact in all extremities and spine. No midline spinal tenderness.   Lymphadenopathy:    He has no cervical adenopathy.  Neurological: He is alert.  No sensory deficits. Strength 5/5 in all extremities. No gait disturbance. Coordination intact including heel to shin and finger to nose. Cranial nerves III-XII grossly intact. No facial droop.   Skin: Skin is warm and dry. He is not diaphoretic.  Psychiatric: He has a normal mood and affect. His behavior is normal.  Nursing note and vitals reviewed.    ED Treatments / Results  Labs (all labs ordered are listed, but only abnormal results are displayed) Labs Reviewed  COMPREHENSIVE METABOLIC PANEL - Abnormal; Notable for the following:       Result Value   Sodium 132 (*)    Glucose, Bld 195 (*)    BUN 24 (*)    Creatinine, Ser 2.03 (*)    Calcium 8.6 (*)    Total Protein 6.4 (*)    GFR calc non Af Amer 31 (*)    GFR calc Af Amer 35 (*)    All other components within normal limits  CBC WITH DIFFERENTIAL/PLATELET - Abnormal; Notable for the following:    RBC 3.94 (*)    Hemoglobin 12.3 (*)    HCT 35.9 (*)    All other components within normal limits  ETHANOL  URINALYSIS, ROUTINE W REFLEX MICROSCOPIC  RAPID URINE DRUG SCREEN, HOSP PERFORMED  I-STAT TROPOININ, ED  CBG MONITORING, ED     EKG  EKG Interpretation  Date/Time:  Thursday May 18 2016 13:19:19 EDT Ventricular Rate:  59 PR Interval:    QRS Duration: 89 QT Interval:  386 QTC Calculation: 383 R Axis:   -22 Text Interpretation:  Sinus rhythm RSR' in V1 or V2, right VCD or RVH Inferior infarct, old Lateral leads are also involved no acute changes compared to  04/25/2016 Confirmed by LIU  MD, Hinton Dyer [phone]) on 05/18/2016 2:00:52 PM       Radiology Dg Chest 2 View  Result Date: 05/18/2016 CLINICAL DATA:  Syncope.  Hyperglycemia. EXAM: CHEST  2 VIEW COMPARISON:  Two-view chest x-ray 04/26/2016 FINDINGS: The heart size is normal. Atherosclerotic calcifications are again noted at the aortic arch. Lung volumes are low. Linear atelectasis or scarring is again seen at the left base. The lungs are otherwise clear. There is no edema or effusion. IMPRESSION: 1. No acute cardiopulmonary disease or significant interval change. 2. Low lung volumes with left basilar atelectasis or scarring. Electronically Signed   By: San Morelle M.D.   On: 05/18/2016 14:43    Procedures Procedures (including critical care time)  Medications Ordered in ED Medications  0.9 %  sodium chloride infusion (not administered)  sodium chloride 0.9 % bolus 1,000 mL (1,000 mLs Intravenous New Bag/Given 05/18/16 1506)     Initial Impression / Assessment and Plan / ED Course  I have reviewed the triage vital signs and the nursing notes.  Pertinent labs & imaging results that were available during my care of the patient were reviewed by me and considered in my medical decision making (see chart for details).  Clinical Course as of May 19 1598  Thu May 18, 2016  1559 Spoke with Dr. Wynetta Emery, hospitalist, who agreed to admit the patient.  [SJ]    Clinical Course User Index [SJ] Harlo Fabela C, PA-C    Patient presents with a near syncopal event. Based on patient recount of the event, speed of recovery, and physical exam findings, orthostatic versus vagal syncope suspected. However, due to the patient's risk factors, observation admission recommended.      Findings and plan of care discussed with Brantley Stage, MD. Dr. Oleta Mouse personally evaluated and examined this patient.  Vitals:   05/18/16 1415 05/18/16 1430 05/18/16 1445 05/18/16 1500  BP: 109/62 98/61 114/68 120/71  Pulse: 64  (!) 59 (!) 58 (!) 55  Resp: 16 12 12 15   SpO2: 96% 100% 100% 100%   Vitals:   05/18/16 1500 05/18/16 1515 05/18/16 1530 05/18/16 1545  BP: 120/71 124/75 (!) 133/92 127/73  Pulse: (!) 55 (!) 53 61 (!) 59  Resp: 15 (!) 22 15 18   SpO2: 100% 100% 98% 98%    Additional pertinent details from patient history: Patient had an episode of syncope with associate chest, right shoulder, and right neck pain for which he was seen in the ED on April 25, 2016. Admitted and then discharged 4/19. Head CT and chest x-ray were without acute abnormality. Echo performed April 19 shows EF of 60-65% with mild aortic and mitral valve regurg. Patient had bilateral carotid duplex ultrasound performed in October 2016. Bilateral ICA stenosis rated 1-39% with no significant changes since previous study in 2015. Patient had a cardiac cath performed in March 2015 by [name] with following findings:  IMPRESSIONS (Cardiac cath 04/03/2013):  1. Apparently absent left main coronary artery. There appear to be separate ostia of the left anterior descending and left circumflex. 2. Patent stent in the mid left anterior descending artery.  Mild disease in the remainder of the LAD and its branches. 3. Moderate focal lesion in the mid left circumflex artery.  FFR of this lesion was negative for ischemia. No significant disease in the remainder of the left circumflex and its branches. 4. Widely patent right coronary artery. 5. Normal left ventricular systolic function.  LVEDP 12 mmHg.  Ejection fraction 50%. 6.  Significant tortuosity in the right subclavian/innominate area. Would not attempt radial cath from the right radial in the future. If radial approach is necessary, would try left radial.  CT Angio of the Head and Neck on 05/07/15 at [facility]: 1.Non-opacification of the majority of the nondominant left vertebral artery with reconstitution via muscular collaterals at the C1 vertebral body and subsequent poor  opacification to the level of the PICA. Findings are consistent with age-indeterminate occlusion or slow flow, likely related to the sequela of dissection or atherosclerotic disease. Per review of the electronic medical record, the patient's clinical team is aware of this finding. 2.No acute intracranial abnormality. Although no evidence of acute infarction, mass, or hemorrhage is seen, CT is relatively insensitive for the detection of hypoxia/ischemia within the first 24 to 48 hours, and an MRI scan may be indicated. 3.Small remote infarcts in the left cerebellum and posterior left frontal lobe. 4.Atherosclerotic calcifications of the cervical and intracranial carotid arteries without hemodynamically significant stenosis. 5.Postsurgical changes of anterior plate and screw fixation spanning C3-C6 with corpectomy and strut graft placement. The C6 screws traverse the C6-7 disc space. 6.Changes of chronic sinusitis and near complete opacification of the right sphenoid sinus with hyperdense material, likely reflecting inspissated secretions versus fungal colonization.  Final Clinical Impressions(s) / ED Diagnoses   Final diagnoses:  Near syncope  AKI (acute kidney injury) [facility])    New Prescriptions New Prescriptions   No medications on file     Layla Maw 05/18/16 1600    Forde Dandy, MD 05/18/16 [phone]

## 2016-05-18 NOTE — ED Notes (Signed)
Pt's CBG result was 151. Informed Ronalee Belts - RN.

## 2016-05-18 NOTE — ED Triage Notes (Signed)
Pt arrived via ems from a local park for a bbq when he experienced a sudden onset of dizziness and fatigue. Pt denies n/v/d or pain. Pt states he had some blurred vision but it has since cleared up. Per the Pt, he has hx of DM, cardiac cath, and htn. EMS initial BP was 77/41, CBG 232. Pt denies CP or SOB. Pt states he was sweating. EMS administered 519ml bolus of NS en route. Pt is alert and oriented x4.

## 2016-05-18 NOTE — Progress Notes (Signed)
RT NOTE:  Pt refuses CPAP tonight. Pt understands to call RN/RT if he changes his mind.

## 2016-05-18 NOTE — H&P (Signed)
History and Physical  Steve Brown XKG:[id number] DOB: April 28, 1940 DOA: 05/18/2016  Referring physician: Arlean Hopping  PCP: Lawerance Cruel, MD  Outpatient Specialists:  1. Asencion Partridge Dohmeier MD - Neurology  Chief Complaint: almost passed out   HPI: Steve Brown is a 76 y.o. male with a history of CAD, PE, CKD stage II, HTN, CVA, and poorly controlled IDDM, presented to the ED complaining of a near syncopal episode that occurred just prior to arrival. Patient states he was walking around outside setting up for a BBQ.  He became hot, sweaty, vision blurred, generalized weakness, and room-spinning dizziness. Patient sat down and then felt like he might lose consciousness. Family members helped the patient stay sitting upright. Patient did not fall or pass out.  EMS was called and patient was noted to be dehydrated and EMS reports initial BP 77/41 and CBG 232. Patient states he currently feels much better and back to normal baseline. Patient had a normal breakfast this morning, but only had one bottle of water.  He does take blood pressure medication and also takes diuretics.  He denies CP, SOB, N/V/D, abdominal pain, fever/chills, numbness or focal weakness, urinary complaints, or any other complaints.  He is being admitted for observation for IVFs.  He was noted in ED to have Acute renal injury likely from dehydration.     Review of Systems: All systems reviewed and apart from history of presenting illness, are negative.  Past Medical History:  Diagnosis Date  . Benign localized hyperplasia of prostate without urinary obstruction and other lower urinary tract symptoms (LUTS)   . Blood dyscrasia   . CKD (chronic kidney disease), stage II   . Coronary artery disease   . Degeneration of cervical intervertebral disc   . Depression   . Diabetes mellitus   . Dyspnea    with exertion  . GERD (gastroesophageal reflux disease)   . History of TIAs   . Hypersomnia with sleep apnea, unspecified    . Hypertension   . Lumbago   . Neuropathy   . Other and unspecified hyperlipidemia   . Pain in joint, multiple sites   . PE (pulmonary thromboembolism) ([place])   . Personal history of unspecified circulatory disease   . Pneumonia 2005   2010  . Sleep apnea   . Stroke [facility]) 2002, 2003   both sided weakness  . Syncope 04/25/2016  . Trigger finger of both hands 11-17-13  . Type II or unspecified type diabetes mellitus without mention of complication, not stated as uncontrolled   . Unspecified cardiovascular disease   . Unspecified essential hypertension   . Unspecified fall   . Unsteady gait 05/19/2013   X 24 hours - improving. Hx prior CVA/TIAs; multiple risk factors; on Xarelto   Past Surgical History:  Procedure Laterality Date  . CARDIAC CATHETERIZATION    . CARPAL TUNNEL RELEASE     bilateral  . CERVICAL DISC SURGERY    . CHOLECYSTECTOMY N/A 11/02/2015   Procedure: LAPAROSCOPIC CHOLECYSTECTOMY WITH INTRAOPERATIVE CHOLANGIOGRAM;  Surgeon: Donnie Mesa, MD;  Location: [place];  Service: General;  Laterality: N/A;  . COLONOSCOPY    . CORONARY ANGIOPLASTY    . CORONARY STENT PLACEMENT  Feb. 2010  . ELBOW SURGERY     right elbow - nerve release  . EYE SURGERY Bilateral    cataract  . HERNIA REPAIR     umbicical  . LEFT HEART CATHETERIZATION WITH CORONARY ANGIOGRAM N/A 04/03/2013   Procedure: LEFT HEART CATHETERIZATION  WITH CORONARY ANGIOGRAM;  Surgeon: Jettie Booze, MD;  Location: [facility] CATH LAB;  Service: Cardiovascular;  Laterality: N/A;  . lungs     "fluid pumped off lungs"   Social History:  reports that he has never smoked. He has never used smokeless tobacco. He reports that he does not drink alcohol or use drugs.   Allergies  Allergen Reactions  . Other Other (See Comments)    Per patient- cardiac cath dye-  "woke up during procedure hysterical."  . Phenergan [Promethazine] Other (See Comments)    Mood changes     Family History  Problem Relation Age of  Onset  . Aneurysm Mother   . Cancer Father   . Heart attack Neg Hx     Prior to Admission medications   Medication Sig Start Date End Date Taking? Authorizing Provider  amoxicillin (AMOXIL) 500 MG capsule Take 1 capsule (500 mg total) by mouth every 8 (eight) hours. 04/27/16   Ghimire, Henreitta Leber, MD  Ascorbic Acid (VITAMIN C PO) Take 1 tablet by mouth daily.    [provider]  aspirin EC 81 MG tablet Take 1 tablet (81 mg total) by mouth daily. 11/05/14   Nita Sells, MD  BD PEN NEEDLE NANO U/F 32G X 4 MM MISC by Does not apply route.  09/01/13   [provider]  bisacodyl (DULCOLAX) 5 MG EC tablet Take 5 mg by mouth daily as needed for moderate constipation.    [provider]  celecoxib (CELEBREX) 200 MG capsule Take 200 mg by mouth 2 (two) times daily.  05/04/16   [provider]  cholecalciferol (VITAMIN D) 1000 UNITS tablet Take 1,000 Units by mouth daily.    [provider]  dutasteride (AVODART) 0.5 MG capsule Take 0.5 mg by mouth daily.    [provider]  furosemide (LASIX) 20 MG tablet Take 20 mg by mouth daily as needed (swelling).     [provider]  HUMALOG MIX 75/25 KWIKPEN (75-25) 100 UNIT/ML Kwikpen Inject 18-20 Units into the skin 2 (two) times daily. 18 units in evening, and 20 units in the morning. 10/21/13   [provider]  memantine (NAMENDA) 10 MG tablet Take 1 tablet (10 mg total) by mouth 2 (two) times daily. 03/27/16   Dohmeier, Asencion Partridge, MD  metFORMIN (GLUCOPHAGE-XR) 500 MG 24 hr tablet Take 500 mg by mouth 2 (two) times daily. 04/02/16   [provider]  nitroGLYCERIN (NITROSTAT) 0.4 MG SL tablet Place 1 tablet (0.4 mg total) under the tongue every 5 (five) minutes as needed for chest pain. 12/07/15   Jettie Booze, MD  pantoprazole (PROTONIX) 40 MG tablet Take 40 mg by mouth daily.    [provider]  pregabalin (LYRICA) 300 MG capsule Take 1 capsule (300 mg total) by  mouth 2 (two) times daily. 07/26/12   Dohmeier, Asencion Partridge, MD  ramipril (ALTACE) 2.5 MG capsule Take 2.5 mg by mouth daily. 09/08/14   [provider]  rivaroxaban (XARELTO) 20 MG TABS tablet Take 20 mg by mouth daily.     [provider]  rosuvastatin (CRESTOR) 10 MG tablet Take 10 mg by mouth every evening.     [provider]  valACYclovir (VALTREX) 500 MG tablet Take 500 mg by mouth daily.    [provider]   Physical Exam: Vitals:   05/18/16 1500 05/18/16 1515 05/18/16 1530 05/18/16 1545  BP: 120/71 124/75 (!) 133/92 127/73  Pulse: (!) 55 (!) 53 61 (!)  59  Resp: 15 (!) 22 15 18   SpO2: 100% 100% 98% 98%     General exam: Moderately built and nourished patient, lying comfortably supine on the gurney in no obvious distress.  Head, eyes and ENT: Nontraumatic and normocephalic. Pupils equally reacting to light and accommodation. Oral mucosa dry.  Neck: Supple. No JVD, carotid bruit or thyromegaly.  Lymphatics: No lymphadenopathy.  Respiratory system: Clear to auscultation. No increased work of breathing.  Cardiovascular system: S1 and S2 heard. No JVD, murmurs, gallops, clicks or pedal edema.  Gastrointestinal system: Abdomen is nondistended, soft and nontender. Normal bowel sounds heard. No organomegaly or masses appreciated.  Central nervous system: Alert and oriented. No focal neurological deficits.  Extremities: Symmetric 5 x 5 power. Peripheral pulses symmetrically felt.   Skin: No rashes or acute findings.  Musculoskeletal system: Negative exam.  Psychiatry: Pleasant and cooperative.   Labs on Admission:  Basic Metabolic Panel:  Recent Labs Lab 05/18/16 1455  NA 132*  K 5.0  CL 102  CO2 23  GLUCOSE 195*  BUN 24*  CREATININE 2.03*  CALCIUM 8.6*   Liver Function Tests:  Recent Labs Lab 05/18/16 1455  AST 26  ALT 22  ALKPHOS 72  BILITOT 0.5  PROT 6.4*  ALBUMIN 3.7   No results for input(s): LIPASE, AMYLASE in the  last 168 hours. No results for input(s): AMMONIA in the last 168 hours. CBC:  Recent Labs Lab 05/18/16 1455  WBC 6.1  NEUTROABS 3.8  HGB 12.3*  HCT 35.9*  MCV 91.1  PLT 184   Cardiac Enzymes: No results for input(s): CKTOTAL, CKMB, CKMBINDEX, TROPONINI in the last 168 hours.  BNP (last 3 results) No results for input(s): PROBNP in the last 8760 hours. CBG: No results for input(s): GLUCAP in the last 168 hours.  Radiological Exams on Admission: Dg Chest 2 View  Result Date: 05/18/2016 CLINICAL DATA:  Syncope.  Hyperglycemia. EXAM: CHEST  2 VIEW COMPARISON:  Two-view chest x-ray 04/26/2016 FINDINGS: The heart size is normal. Atherosclerotic calcifications are again noted at the aortic arch. Lung volumes are low. Linear atelectasis or scarring is again seen at the left base. The lungs are otherwise clear. There is no edema or effusion. IMPRESSION: 1. No acute cardiopulmonary disease or significant interval change. 2. Low lung volumes with left basilar atelectasis or scarring. Electronically Signed   By: San Morelle M.D.   On: 05/18/2016 14:43    EKG: Independently reviewed. NSR Echo: 4/19: Study Conclusions  - Left ventricle: Normal GLLS at -18.4% The cavity size was normal.   There was severe focal basal hypertrophy. Systolic function was  normal. The estimated ejection fraction was in the range of 60%  to 65%. Wall motion was normal; there were no regional wall  motion abnormalities. Features are consistent with a pseudonormal  left ventricular filling pattern, with concomitant abnormal  relaxation and increased filling pressure (grade 2 diastolic dysfunction). - Aortic valve: Trileaflet; normal thickness, mildly calcified  leaflets. There was mild regurgitation. - Aorta: Aortic root dimension: 39 mm (ED). - Aortic root: The aortic root was mildly dilated. - Mitral valve: There was mild regurgitation. - Atrial septum: There was increased thickness of the septum,    consistent with lipomatous hypertrophy. - Pulmonary arteries: PA peak pressure: 42 mm Hg (S).  Impressions:  - The right ventricular systolic pressure was increased consistent with moderate pulmonary hypertension.  Assessment/Plan Principal Problem:   Acute kidney injury [facility]) Active Problems:   Hemiparesis ([place])  Diabetes mellitus type 2 with complications ([place])   CAD in native artery   History of pulmonary embolism: June 2014,  Takes Xarelto   Degeneration of cervical intervertebral disc   Unsteady gait   OSA on CPAP   CKD (chronic kidney disease), stage II   Dehydration   Near syncope   Orthostatic hypotension  1. Acute Kidney Injury - likely secondary to dehydration from sweating and UTI, hyperglycemia and poor oral intake, hopefully will improve with hydration.  IV normal saline infusion ordered, follow BMP, avoid nephrotoxins.  Check bladder scan and renal US.  2. Orthostatic hypotension - hydration with IVF ordered.  Fall precautions.  PT evaluation ordered.   3. Dehydration - hold lasix, hydrate with IV normal saline.   4. Near syncope - secondary to hypotension, AKI and dehydration, BPs improving with fluids.  Fall precautions.  Monitor on telemetry overnight.   5. Uncontrolled diabetes mellitus with complications - as evidenced by an A1c >9%. Pt is followed by endocrinology.  He is on Humalog 75/25 BID plus metformin and prandin as needed.  Would hold prandin and metformin for now, sliding scale coverage ordered, reduced dose of humalog given AKI.  Monitor BS and adjust treatment as required for good glycemic control. Hypoglycemia orders in place.   6. OSA on CPAP - ordered CPAP in hospital.  7. Pulmonary Hypertension - stable.   8. Chronic anticoagulation, history of PE - resume xarelto (renally dosed).   9. CAD - stable. No chest pain and normal troponin.  EKG reviewed by me. 10. BPH- rule out obstruction given AKI, check bladder scan and renal US.  Resume home flomax  and avodart.   11. UTI - pt was being treated for UTI prior to admission, resuming Bactrim DS tablets.   Denies dysuria.     DVT Prophylaxis: Xarelto  Code Status: full   Family Communication: bedside  Disposition Plan: Home in 1-2 midnights   Time spent: 91 mins  Irwin Brakeman, MD [facility] Pager [phone]   If 7PM-7AM, please contact night-coverage [email or web address] Password [email or web address] 05/18/2016, 4:17 PM

## 2016-05-19 DIAGNOSIS — N390 Urinary tract infection, site not specified: Secondary | ICD-10-CM | POA: Diagnosis not present

## 2016-05-19 DIAGNOSIS — I951 Orthostatic hypotension: Secondary | ICD-10-CM | POA: Diagnosis not present

## 2016-05-19 DIAGNOSIS — E118 Type 2 diabetes mellitus with unspecified complications: Secondary | ICD-10-CM | POA: Diagnosis not present

## 2016-05-19 DIAGNOSIS — Z86711 Personal history of pulmonary embolism: Secondary | ICD-10-CM | POA: Diagnosis not present

## 2016-05-19 DIAGNOSIS — Z794 Long term (current) use of insulin: Secondary | ICD-10-CM | POA: Diagnosis not present

## 2016-05-19 DIAGNOSIS — R55 Syncope and collapse: Secondary | ICD-10-CM | POA: Diagnosis not present

## 2016-05-19 DIAGNOSIS — E860 Dehydration: Secondary | ICD-10-CM | POA: Diagnosis not present

## 2016-05-19 DIAGNOSIS — N179 Acute kidney failure, unspecified: Secondary | ICD-10-CM | POA: Diagnosis not present

## 2016-05-19 DIAGNOSIS — M5030 Other cervical disc degeneration, unspecified cervical region: Secondary | ICD-10-CM | POA: Diagnosis not present

## 2016-05-19 DIAGNOSIS — I2510 Atherosclerotic heart disease of native coronary artery without angina pectoris: Secondary | ICD-10-CM | POA: Diagnosis not present

## 2016-05-19 DIAGNOSIS — N182 Chronic kidney disease, stage 2 (mild): Secondary | ICD-10-CM | POA: Diagnosis not present

## 2016-05-19 LAB — BASIC METABOLIC PANEL
Anion gap: 5 (ref 5–15)
Anion gap: 6 (ref 5–15)
BUN: 14 mg/dL (ref 6–20)
BUN: 13 mg/dL (ref 6–20)
CO2: 25 mmol/L (ref 22–32)
CO2: 26 mmol/L (ref 22–32)
Calcium: 8.6 mg/dL — ABNORMAL LOW (ref 8.9–10.3)
Calcium: 8.7 mg/dL — ABNORMAL LOW (ref 8.9–10.3)
Chloride: 107 mmol/L (ref 101–111)
Chloride: 106 mmol/L (ref 101–111)
Creatinine, Ser: 1.43 mg/dL — ABNORMAL HIGH (ref 0.61–1.24)
Creatinine, Ser: 1.28 mg/dL — ABNORMAL HIGH (ref 0.61–1.24)
GFR calc Af Amer: 54 mL/min — ABNORMAL LOW (ref >60)
GFR calc Af Amer: >60 mL/min (ref >60)
GFR calc non Af Amer: 47 mL/min — ABNORMAL LOW (ref >60)
GFR calc non Af Amer: 53 mL/min — ABNORMAL LOW (ref >60)
Glucose, Bld: 216 mg/dL — ABNORMAL HIGH (ref 65–99)
Glucose, Bld: 174 mg/dL — ABNORMAL HIGH (ref 65–99)
Potassium: 5.9 mmol/L — ABNORMAL HIGH (ref 3.5–5.1)
Potassium: 5.3 mmol/L — ABNORMAL HIGH (ref 3.5–5.1)
Sodium: 137 mmol/L (ref 135–145)
Sodium: 138 mmol/L (ref 135–145)

## 2016-05-19 LAB — CBC
HCT: 36.9 % — ABNORMAL LOW (ref 39.0–52.0)
Hemoglobin: 12.2 g/dL — ABNORMAL LOW (ref 13.0–17.0)
MCH: 30.2 pg (ref 26.0–34.0)
MCHC: 33.1 g/dL (ref 30.0–36.0)
MCV: 91.3 fL (ref 78.0–100.0)
Platelets: 173 10*3/uL (ref 150–400)
RBC: 4.04 MIL/uL — ABNORMAL LOW (ref 4.22–5.81)
RDW: 13.1 % (ref 11.5–15.5)
WBC: 4.3 10*3/uL (ref 4.0–10.5)

## 2016-05-19 LAB — GLUCOSE, CAPILLARY
Glucose-Capillary: 41 mg/dL — CL (ref 65–99)
Glucose-Capillary: 81 mg/dL (ref 65–99)
Glucose-Capillary: 184 mg/dL — ABNORMAL HIGH (ref 65–99)
Glucose-Capillary: 159 mg/dL — ABNORMAL HIGH (ref 65–99)
Glucose-Capillary: 199 mg/dL — ABNORMAL HIGH (ref 65–99)
Glucose-Capillary: 168 mg/dL — ABNORMAL HIGH (ref 65–99)

## 2016-05-19 LAB — MAGNESIUM: Magnesium: 1.9 mg/dL (ref 1.7–2.4)

## 2016-05-19 LAB — PHOSPHORUS: Phosphorus: 3 mg/dL (ref 2.5–4.6)

## 2016-05-19 MED ORDER — SODIUM POLYSTYRENE SULFONATE 15 GM/60ML PO SUSP
30.0000 g | Freq: Once | ORAL | Status: AC
  Administered 2016-05-19: 30 g via ORAL
  Filled 2016-05-19: qty 120

## 2016-05-19 MED ORDER — FUROSEMIDE 10 MG/ML IJ SOLN: 20.0000 mg | Freq: Once | INTRAMUSCULAR | Status: DC

## 2016-05-19 MED ORDER — RIVAROXABAN 20 MG PO TABS
20.0000 mg | ORAL_TABLET | Freq: Every day | ORAL | Status: DC
  Administered 2016-05-19: 20 mg via ORAL
  Filled 2016-05-19: qty 1

## 2016-05-19 NOTE — Evaluation (Signed)
Physical Therapy Evaluation Patient Details Name: Steve Brown MRN: [id number] DOB: 08-28-1940 Today's Date: 05/19/2016   History of Present Illness  76 y.o. male with a history of CAD, PE, CKD stage II, HTN, CVA, and poorly controlled IDDM, presented to the ED complaining of a near syncopal episode that occurred just prior to arrival.  Clinical Impression  Pt presents with the above diagnosis and below deficits for therapy evaluation. Pt was independent prior to hospitalizations including working out at [employer] at least 4x a week. Pt is Mod I to Supervision for a majority of activities this session, but has 1 LOB with self recovery this session. Pt will benefit from an additional acute PT visits if not DC'd in order to address balance deficits.     Follow Up Recommendations No PT follow up    Equipment Recommendations  None recommended by PT    Recommendations for Other Services       Precautions / Restrictions Precautions Precautions: Fall Restrictions Weight Bearing Restrictions: No      Mobility  Bed Mobility Overal bed mobility: Independent             General bed mobility comments: able to get EOB without any assistance  Transfers Overall transfer level: Modified independent Equipment used: None             General transfer comment: uses hand to stand up from EOB  Ambulation/Gait Ambulation/Gait assistance: Supervision;Min guard Ambulation Distance (Feet): 150 Feet Assistive device: None Gait Pattern/deviations: Step-through pattern;Decreased step length - right;Decreased step length - left;Drifts right/left Gait velocity: decreased Gait velocity interpretation: Below normal speed for age/gender General Gait Details: Pt is requires Min guard for portion of gait and supervision for majority. Pt has 1 LOB toward right with self-recovery by grasping onto railing. slow cadence and decreased step length noted. Pt reports this is his normal  sequencing  Stairs Stairs: Yes Stairs assistance: Modified independent (Device/Increase time) Stair Management: One rail Right Number of Stairs: 18 General stair comments: able to stand ascend and descend stairs with Mod I  Wheelchair Mobility    Modified Rankin (Stroke Patients Only)       Balance Overall balance assessment: History of Falls;Needs assistance Sitting-balance support: No upper extremity supported;Feet supported Sitting balance-Leahy Scale: Normal     Standing balance support: No upper extremity supported Standing balance-Leahy Scale: Good Standing balance comment: 1 LOB with gait, steady with static balance                             Pertinent Vitals/Pain Pain Assessment: No/denies pain    Home Living Family/patient expects to be discharged to:: Private residence Living Arrangements: Alone Available Help at Discharge: Family;Friend(s);Available PRN/intermittently Type of Home: Apartment Home Access: Stairs to enter Entrance Stairs-Rails: Right Entrance Stairs-Number of Steps: 18 Home Layout: One level Home Equipment: Cane - single point Additional Comments: pt works out 4x per week    Prior Function Level of Independence: Independent         Comments: drives, 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[employer], [employer] manager   Dominant Hand: Right    Extremity/Trunk Assessment   Upper Extremity Assessment Upper Extremity Assessment: Defer to OT evaluation    Lower Extremity Assessment Lower Extremity Assessment: Overall WFL for tasks assessed       Communication   Communication: No difficulties  Cognition Arousal/Alertness: Awake/alert Behavior During Therapy: WFL for tasks assessed/performed Overall Cognitive  Status: Within Functional Limits for tasks assessed                                        General Comments General comments (skin integrity, edema, etc.): Celesta Gentile is present throughout assessment. Pt  plans to go home with her from the hospital before returning to his home.     Exercises     Assessment/Plan    PT Assessment Patient needs continued PT services  PT Problem List Decreased balance;Decreased mobility       PT Treatment Interventions Gait training;Functional mobility training;Balance training    PT Goals (Current goals can be found in the Care Plan section)  Acute Rehab PT Goals Patient Stated Goal: get back to normal routine PT Goal Formulation: With patient Time For Goal Achievement: 05/26/16 Potential to Achieve Goals: Good    Frequency Min 3X/week   Barriers to discharge        Co-evaluation               AM-PAC PT "6 Clicks" Daily Activity  Outcome Measure Difficulty turning over in bed (including adjusting bedclothes, sheets and blankets)?: None Difficulty moving from lying on back to sitting on the side of the bed? : None Difficulty sitting down on and standing up from a chair with arms (e.g., wheelchair, bedside commode, etc,.)?: None Help needed moving to and from a bed to chair (including a wheelchair)?: A Little Help needed walking in hospital room?: A Little Help needed climbing 3-5 steps with a railing? : A Little 6 Click Score: 21    End of Session Equipment Utilized During Treatment: Gait belt Activity Tolerance: Patient tolerated treatment well Patient left: in bed;with call bell/phone within reach;with family/visitor present Nurse Communication: Mobility status PT Visit Diagnosis: Unsteadiness on feet (R26.81)    Time: 1135-1150 PT Time Calculation (min) (ACUTE ONLY): 15 min   Charges:   PT Evaluation $PT Eval Moderate Complexity: 1 Procedure     PT G Codes:   PT G-Codes **NOT FOR INPATIENT CLASS** Functional Assessment Tool Used: AM-PAC 6 Clicks Basic Mobility;Clinical judgement Functional Limitation: Mobility: Walking and moving around Mobility: Walking and Moving Around Current Status (Y6378): At least 20 percent but  less than 40 percent impaired, limited or restricted Mobility: Walking and Moving Around Goal Status [phone]): 0 percent impaired, limited or restricted    Scheryl Marten PT, DPT  [phone]   Shanon Rosser 05/19/2016, 12:26 PM

## 2016-05-19 NOTE — Progress Notes (Signed)
Inpatient Diabetes Program Recommendations  AACE/ADA: New Consensus Statement on Inpatient Glycemic Control (2015)  Target Ranges:  Prepandial:   less than 140 mg/dL      Peak postprandial:   less than 180 mg/dL (1-2 hours)      Critically ill patients:  140 - 180 mg/dL   Results for ARMONTE, TORTORELLA (MRN [id number]) as of 05/19/2016 10:02  Ref. Range 05/18/2016 16:47 05/18/2016 21:05 05/19/2016 01:41 05/19/2016 02:00 05/19/2016 02:52 05/19/2016 07:44  Glucose-Capillary Latest Ref Range: 65 - 99 mg/dL 151 (H) 266 (H) 41 (LL) 81 184 (H) 159 (H)    Admit with: Acute Kidney Injury/ Dehydration/ UTI  History: DM  Home DM Meds: Humalog 75/25 Insulin- 18 units AM/ 20 units PM       Metformin 500 mg BID  Current Insulin Orders: 70/30 Insulin- 15 units BID          Novolog Sensitive Correction Scale/ SSI (0-9 units) TID AC + HS      MD- Note patient had Hypoglycemic event last night at 1am.    Question if the Novolog that patient received at 10pm for elevated CBG of 266 mg/dl (pt got 3 units Novolog SSI last PM) caused the low CBG??  Please consider discontinuation of bedtime correction scale (SSI).    --Will follow patient during hospitalization--  Wyn Quaker RN, MSN, CDE Diabetes Coordinator Inpatient Glycemic Control Team Team Pager: [phone] (8a-5p)

## 2016-05-19 NOTE — Discharge Summary (Addendum)
Physician Discharge Summary  Steve Brown RKY:[id number] DOB: Jan 30, 1940 DOA: 05/18/2016  PCP: Lawerance Cruel, MD  Admit date: 05/18/2016 Discharge date: 05/19/2016  Admitted From: Home  Disposition:  Home   Recommendations for Outpatient Follow-up:  1. Follow up with PCP in 1 weeks 2. Please obtain BMP/CBC in one week 3. Please follow up with cardiologist in 2 week.   Discharge Condition: STABLE  CODE STATUS: FULL   Brief/Interim Summary: HPI: Steve Brown is a 76 y.o. male with a history of CAD, PE, CKD stage II, HTN, CVA, and poorly controlled IDDM, presented to the ED complaining of a near syncopal episode that occurred just prior to arrival. Patient states he was walking around outside setting up for a BBQ.  He became hot, sweaty, vision blurred, generalized weakness, and room-spinning dizziness. Patient sat down and then felt like he might lose consciousness. Family members helped the patient stay sitting upright. Patient did not fall or pass out.  EMS was called and patient was noted to be dehydrated and EMS reports initial BP 77/41 and CBG 232. Patient states he currently feels much better and back to normal baseline. Patient had a normal breakfast this morning, but only had one bottle of water.  He does take blood pressure medication and also takes diuretics.  He denies CP, SOB, N/V/D, abdominal pain, fever/chills, numbness or focal weakness, urinary complaints, or any other complaints.  He is being admitted for observation for IVFs.  He was noted in ED to have Acute renal injury likely from dehydration.     1. Acute Kidney Injury - secondary to dehydration from sweating and UTI, hyperglycemia and poor oral intake, hopefully will improve with hydration.  IV normal saline infusion ordered, follow BMP, avoid nephrotoxins.  Check bladder scan and renal US.  2. Orthostatic hypotension - hydration with IVF ordered.  Fall precautions.  PT evaluation did not recommend additional PT  needs.  3. Dehydration - hold lasix, hydrate with IV normal saline.  improved with IvF hydration.  4. Near syncope - secondary to hypotension, AKI and dehydration, BPs improving with fluids.  Fall precautions.  Monitored on telemetry overnight.   5. Uncontrolled diabetes mellitus with complications - as evidenced by an A1c >9%. Pt is followed by endocrinology.  He is on Humalog 75/25 BID plus metformin and prandin as needed.  Would hold prandin and metformin for now, sliding scale coverage ordered, reduced dose of humalog given AKI.  Monitor BS and adjust treatment as required for good glycemic control. Hypoglycemia orders in place.   6. Hypoglycemia- Pt had an episode of low blood glucose treated with sandwich and crackers, suspect from insulin stacking from HS correction insulin which was discontinued, no recurrence of low BS seen.  Advised to monitor BS closely at home.   7. OSA on CPAP - ordered CPAP in hospital.  8. Pulmonary Hypertension - stable.   9. Hyperkalemia - treated with kayexalate, repeat BMP shows improvement, recheck with PCP next week.  10. Chronic anticoagulation, history of PE - resumed xarelto.    11. CAD - stable. No chest pain and normal troponin.  EKG reviewed by me. 12. BPH- rule out obstruction given AKI, checked bladder scan and renal US with normal kidney appearance.  Resumed home flomax and avodart.   13. UTI - pt was being treated for UTI prior to admission, resuming Bactrim DS tablets.   Denies dysuria.     DVT Prophylaxis: Xarelto  Code Status: full   Family Communication: bedside  Disposition Plan: Home  Discharge Diagnoses:  Principal Problem:   Acute kidney injury [facility]) Active Problems:   Hemiparesis ([place])   Diabetes mellitus type 2 with complications ([place])   CAD in native artery   History of pulmonary embolism: June 2014,  Takes Xarelto   Degeneration of cervical intervertebral disc   Unsteady gait   OSA on CPAP   CKD (chronic kidney disease), stage  II   UTI (urinary tract infection)   Dehydration   Near syncope   Orthostatic hypotension  Discharge Instructions  Discharge Instructions    Increase activity slowly    Complete by:  As directed      Allergies as of 05/19/2016      Reactions   Other Other (See Comments)   Per patient- cardiac cath dye-  "woke up during procedure hysterical."   Phenergan [promethazine] Other (See Comments)   Mood changes       Medication List    STOP taking these medications   celecoxib 200 MG capsule Commonly known as:  CELEBREX     TAKE these medications   aspirin EC 81 MG tablet Take 1 tablet (81 mg total) by mouth daily.   BD PEN NEEDLE NANO U/F 32G X 4 MM Misc Generic drug:  Insulin Pen Needle by Does not apply route.   bisacodyl 5 MG EC tablet Commonly known as:  DULCOLAX Take 5 mg by mouth daily as needed for moderate constipation.   cholecalciferol 1000 units tablet Commonly known as:  VITAMIN D Take 1,000 Units by mouth daily.   dutasteride 0.5 MG capsule Commonly known as:  AVODART Take 0.5 mg by mouth daily.   furosemide 20 MG tablet Commonly known as:  LASIX Take 20 mg by mouth daily as needed (swelling).   HUMALOG MIX 75/25 KWIKPEN (75-25) 100 UNIT/ML Kwikpen Generic drug:  Insulin Lispro Prot & Lispro Inject 18-20 Units into the skin 2 (two) times daily. 18 units in evening, and 20 units in the morning.   memantine 10 MG tablet Commonly known as:  NAMENDA Take 1 tablet (10 mg total) by mouth 2 (two) times daily.   metFORMIN 500 MG 24 hr tablet Commonly known as:  GLUCOPHAGE-XR Take 500 mg by mouth 2 (two) times daily.   nitroGLYCERIN 0.4 MG SL tablet Commonly known as:  NITROSTAT Place 1 tablet (0.4 mg total) under the tongue every 5 (five) minutes as needed for chest pain.   pantoprazole 40 MG tablet Commonly known as:  PROTONIX Take 40 mg by mouth daily.   pregabalin 300 MG capsule Commonly known as:  LYRICA Take 1 capsule (300 mg total) by mouth  2 (two) times daily.   ramipril 2.5 MG capsule Commonly known as:  ALTACE Take 2.5 mg by mouth daily.   repaglinide 1 MG tablet Commonly known as:  PRANDIN Take 1 mg by mouth daily as needed.   rivaroxaban 20 MG Tabs tablet Commonly known as:  XARELTO Take 20 mg by mouth daily.   rosuvastatin 10 MG tablet Commonly known as:  CRESTOR Take 10 mg by mouth every evening.   sulfamethoxazole-trimethoprim 800-160 MG tablet Commonly known as:  BACTRIM DS,SEPTRA DS Take 1 tablet by mouth 2 (two) times daily.   tamsulosin 0.4 MG Caps capsule Commonly known as:  FLOMAX Take 0.4 mg by mouth daily.   valACYclovir 500 MG tablet Commonly known as:  VALTREX Take 500 mg by mouth daily.   VITAMIN C PO Take 1 tablet by mouth daily.  Follow-up Information    Lawerance Cruel, MD. Schedule an appointment as soon as possible for a visit in 1 week(s).   Specialty:  Family Medicine Why:  Hospital Follow Up  Contact information: [place] [address] [phone]        Jettie Booze, MD. Schedule an appointment as soon as possible for a visit in 2 week(s).   Specialties:  Cardiology, Radiology, Interventional Cardiology Why:  hospital Follow up  Contact information: [address] [place] [address] [phone]          Allergies  Allergen Reactions  . Other Other (See Comments)    Per patient- cardiac cath dye-  "woke up during procedure hysterical."  . Phenergan [Promethazine] Other (See Comments)    Mood changes    Procedures/Studies: Dg Chest 2 View  Result Date: 05/18/2016 CLINICAL DATA:  Syncope.  Hyperglycemia. EXAM: CHEST  2 VIEW COMPARISON:  Two-view chest x-ray 04/26/2016 FINDINGS: The heart size is normal. Atherosclerotic calcifications are again noted at the aortic arch. Lung volumes are low. Linear atelectasis or scarring is again seen at the left base. The lungs are otherwise clear. There is no edema or  effusion. IMPRESSION: 1. No acute cardiopulmonary disease or significant interval change. 2. Low lung volumes with left basilar atelectasis or scarring. Electronically Signed   By: San Morelle M.D.   On: 05/18/2016 14:43   Dg Chest 2 View  Result Date: 04/26/2016 CLINICAL DATA:  Patient passed out and fell earlier this evening, injuring his right shoulder EXAM: CHEST  2 VIEW COMPARISON:  02/19/2014 FINDINGS: Normal heart size and pulmonary vascularity. Shallow inspiration with linear atelectasis or scarring in the lung bases. No focal consolidation. No blunting of costophrenic angles. No pneumothorax. Degenerative changes in the spine. Postoperative changes in the cervical spine. Surgical clips in the right upper quadrant. Degenerative changes in the shoulders. Calcification of the aorta. IMPRESSION: Shallow inspiration with linear atelectasis in the lung bases. Electronically Signed   By: Lucienne Capers M.D.   On: 04/26/2016 01:09   Dg Shoulder Right  Result Date: 04/26/2016 CLINICAL DATA:  Patient passed out and fell earlier this evening with injury to the right shoulder. EXAM: RIGHT SHOULDER - 2+ VIEW COMPARISON:  None. FINDINGS: Degenerative changes are present in the acromioclavicular and glenohumeral joint. No evidence of acute fracture or dislocation in the right shoulder. No focal bone lesion or bone destruction. Bone cortex appears intact. Incidental postoperative changes in the cervical spine. IMPRESSION: Degenerative changes in the right shoulder. No acute bony abnormalities. Electronically Signed   By: Lucienne Capers M.D.   On: 04/26/2016 01:08   Ct Head Wo Contrast  Result Date: 04/26/2016 CLINICAL DATA:  Blurry vision with syncopal episode EXAM: CT HEAD WITHOUT CONTRAST TECHNIQUE: Contiguous axial images were obtained from the base of the skull through the vertex without intravenous contrast. COMPARISON:  11/04/2014 MRI, CT brain 11/03/2014 FINDINGS: Brain: No acute territorial  infarction, intracranial hemorrhage or focal mass lesion is visualized. Old left cerebellar infarct. Mild atrophy. Stable ventricle size. Vascular: No hyperdense vessels. Scattered calcifications at the carotid siphons. Skull: No fracture.  No suspicious bone lesion Sinuses/Orbits: Mucosal thickening in the sphenoid and ethmoid sinuses. Mild mucosal thickening in the maxillary sinuses. No acute orbital abnormality. Other: Stable fatty mass along the outer table of the left frontal bone. IMPRESSION: No CT evidence for acute intracranial abnormality. Electronically Signed   By: Donavan Foil M.D.   On: 04/26/2016  00:06   US Renal  Result Date: 05/18/2016 CLINICAL DATA:  Acute renal failure. EXAM: RENAL / URINARY TRACT ULTRASOUND COMPLETE COMPARISON:  07/01/2012 FINDINGS: Right Kidney: Length: 12.1. Echogenicity within normal limits. No mass or hydronephrosis visualized. There is a 0.8 cm hypoechoic circumscribed mass within the lower pole of the kidney. Left Kidney: Length: 11.6 cm. Echogenicity within normal limits. No mass or hydronephrosis visualized. 0.8 cm midpole hypoechoic circumscribed mass. Bladder: Left peritrigonal bladder diverticulum. Urinary bladder otherwise normal for its degree of distention. IMPRESSION: Normal appearance of the kidneys, apart from bilateral benign appearing subcentimeter renal cysts. Left peritrigonal bladder diverticulum. Electronically Signed   By: Fidela Salisbury M.D.   On: 05/18/2016 20:35    (Echo, Carotid, EGD, Colonoscopy, ERCP)    Subjective: Pt says he feels much better, he has been ambulating well.    Discharge Exam: Vitals:   05/19/16 0530 05/19/16 1432  BP: (!) 144/82 (!) 151/74  Pulse: (!) 57 60  Resp: 18 20  Temp: 97.4 F (36.3 C) 97.7 F (36.5 C)   Vitals:   05/18/16 1714 05/18/16 2013 05/19/16 0530 05/19/16 1432  BP: (!) 142/70 125/65 (!) 144/82 (!) 151/74  Pulse: 68 (!) 52 (!) 57 60  Resp: 19 18 18 20   Temp:  97.8 F (36.6 C) 97.4 F  (36.3 C) 97.7 F (36.5 C)  TempSrc:  Oral Oral Oral  SpO2: 100% 98% 100% 99%  Weight:      Height:        General: Pt is alert, awake, not in acute distress Cardiovascular: RRR, S1/S2 +, no rubs, no gallops Respiratory: CTA bilaterally, no wheezing, no rhonchi Abdominal: Soft, NT, ND, bowel sounds + Extremities: no edema, no cyanosis  The results of significant diagnostics from this hospitalization (including imaging, microbiology, ancillary and laboratory) are listed below for reference.     Microbiology: No results found for this or any previous visit (from the past 240 hour(s)).   Labs: BNP (last 3 results) No results for input(s): BNP in the last 8760 hours. Basic Metabolic Panel:  Recent Labs Lab 05/18/16 1455 05/19/16 0524 05/19/16 1516  NA 132* 137 138  K 5.0 5.9* 5.3*  CL 102 107 106  CO2 23 25 26   GLUCOSE 195* 216* 174*  BUN 24* 14 13  CREATININE 2.03* 1.43* 1.28*  CALCIUM 8.6* 8.6* 8.7*  MG  --  1.9  --   PHOS  --  3.0  --    Liver Function Tests:  Recent Labs Lab 05/18/16 1455  AST 26  ALT 22  ALKPHOS 72  BILITOT 0.5  PROT 6.4*  ALBUMIN 3.7   No results for input(s): LIPASE, AMYLASE in the last 168 hours. No results for input(s): AMMONIA in the last 168 hours. CBC:  Recent Labs Lab 05/18/16 1455 05/19/16 0524  WBC 6.1 4.3  NEUTROABS 3.8  --   HGB 12.3* 12.2*  HCT 35.9* 36.9*  MCV 91.1 91.3  PLT 184 173   Cardiac Enzymes: No results for input(s): CKTOTAL, CKMB, CKMBINDEX, TROPONINI in the last 168 hours. BNP: Invalid input(s): POCBNP CBG:  Recent Labs Lab 05/19/16 0200 05/19/16 0252 05/19/16 0744 05/19/16 1132 05/19/16 1628  GLUCAP 81 184* 159* 199* 168*   D-Dimer No results for input(s): DDIMER in the last 72 hours. Hgb A1c No results for input(s): HGBA1C in the last 72 hours. Lipid Profile No results for input(s): CHOL, HDL, LDLCALC, TRIG, CHOLHDL, LDLDIRECT in the last 72 hours. Thyroid function studies No results  for input(s): TSH, T4TOTAL, T3FREE, THYROIDAB in the last 72 hours.  Invalid input(s): FREET3 Anemia work up No results for input(s): VITAMINB12, FOLATE, FERRITIN, TIBC, IRON, RETICCTPCT in the last 72 hours. Urinalysis    Component Value Date/Time   COLORURINE STRAW (A) 05/18/2016 1947   APPEARANCEUR CLEAR 05/18/2016 1947   LABSPEC 1.009 05/18/2016 1947   PHURINE 5.0 05/18/2016 1947   GLUCOSEU NEGATIVE 05/18/2016 1947   HGBUR NEGATIVE 05/18/2016 [place] NEGATIVE 05/18/2016 [place] NEGATIVE 05/18/2016 1947   PROTEINUR NEGATIVE 05/18/2016 1947   UROBILINOGEN 0.2 11/03/2014 1701   NITRITE NEGATIVE 05/18/2016 1947   LEUKOCYTESUR NEGATIVE 05/18/2016 1947   Sepsis Labs Invalid input(s): PROCALCITONIN,  WBC,  LACTICIDVEN Microbiology No results found for this or any previous visit (from the past 240 hour(s)).   Time coordinating discharge: Over 30 minutes  SIGNED:  Irwin Brakeman, MD  [facility] 05/19/2016, 4:30 PM Pager [phone]  If 7PM-7AM, please contact night-coverage [email or web address] Password [email or web address]

## 2016-05-19 NOTE — Evaluation (Signed)
Occupational Therapy Evaluation Patient Details Name: Steve Brown MRN: [id number] DOB: June 16, 1940 Today's Date: 05/19/2016    History of Present Illness 76 y.o. male with a history of CAD, PE, CKD stage II, HTN, CVA, and poorly controlled IDDM, presented to the ED complaining of a near syncopal episode that occurred just prior to arrival.   Clinical Impression   Pt admitted with the above diagnoses and presents with below problem list. PTA pt was independent with ADLs, lives alone, drives, and 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the [employer] 4x/week. Pt presents at/near baseline with ADLs. Session details below. No further acute OT needs indicated. OT signing off.     Follow Up Recommendations  No OT follow up    Equipment Recommendations  None recommended by OT    Recommendations for Other Services       Precautions / Restrictions Restrictions Weight Bearing Restrictions: No      Mobility Bed Mobility Overal bed mobility: Independent                Transfers Overall transfer level: Independent                    Balance Overall balance assessment: No apparent balance deficits (not formally assessed)                                         ADL either performed or assessed with clinical judgement   ADL Overall ADL's : At baseline                                       General ADL Comments: Pt completed toilet transfer, simulated tub shower transfer, household distance functional mobility, and bed mobility at/near baseline level. Discussed getting MD clearance prior to resuming exercising. Discussed pausing in standing prior to ambulating after initial stand.      Vision         Perception     Praxis      Pertinent Vitals/Pain Pain Assessment: No/denies pain     Hand Dominance     Extremity/Trunk Assessment Upper Extremity Assessment Upper Extremity Assessment: Overall WFL for tasks assessed   Lower Extremity Assessment Lower  Extremity Assessment: Defer to PT evaluation;Overall WFL for tasks assessed       Communication Communication Communication: No difficulties   Cognition Arousal/Alertness: Awake/alert Behavior During Therapy: WFL for tasks assessed/performed Overall Cognitive Status: Within Functional Limits for tasks assessed                                     General Comments       Exercises     Shoulder Instructions      Home Living Family/patient expects to be discharged to:: Private residence Living Arrangements: Alone Available Help at Discharge: Family;Friend(s);Available PRN/intermittently Type of Home: Apartment Home Access: Stairs to enter Entrance Stairs-Number of Steps: 18 Entrance Stairs-Rails: Right Home Layout: One level     Bathroom Shower/Tub: Teacher, early years/pre: Standard     Home Equipment: Cane - single point   Additional Comments: pt works out 4x per week      Prior Functioning/Environment Level of Independence: Independent        Comments: drives, works out  at [facility] [place], community ambulator        OT Problem List:        OT Treatment/Interventions:      OT Goals(Current goals can be found in the care plan section) Acute Rehab OT Goals Patient Stated Goal: get back to normal routine  OT Frequency:     Barriers to D/C:            Co-evaluation              AM-PAC PT "6 Clicks" Daily Activity     Outcome Measure Help from another person eating meals?: None Help from another person taking care of personal grooming?: None Help from another person toileting, which includes using toliet, bedpan, or urinal?: None Help from another person bathing (including washing, rinsing, drying)?: None Help from another person to put on and taking off regular upper body clothing?: None Help from another person to put on and taking off regular lower body clothing?: None 6 Click Score: 24   End of Session Nurse Communication:  Mobility status  Activity Tolerance: Patient tolerated treatment well Patient left: in bed;with call bell/phone within reach;with family/visitor present;Other (comment) (sitting EOB)  OT Visit Diagnosis: Unsteadiness on feet (R26.81)                Time: 4327-6147 OT Time Calculation (min): 16 min Charges:  OT General Charges $OT Visit: 1 Procedure OT Evaluation $OT Eval Low Complexity: 1 Procedure G-Codes: OT G-codes **NOT FOR INPATIENT CLASS** Functional Assessment Tool Used: AM-PAC 6 Clicks Daily Activity Functional Limitation: Self care Self Care Current Status (W9295): 0 percent impaired, limited or restricted     Hortencia Pilar 05/19/2016, 11:23 AM

## 2016-05-19 NOTE — Discharge Instructions (Addendum)
Seek medical care or return if symptoms recur, worsen or new problems develop.     Information on my medicine - XARELTO (rivaroxaban)  This medication education was reviewed with me or my healthcare representative as part of my discharge preparation.    WHY WAS XARELTO PRESCRIBED FOR YOU? Xarelto was prescribed to treat blood clots that may have been found in the veins of your legs (deep vein thrombosis) or in your lungs (pulmonary embolism) and to reduce the risk of them occurring again.  What do you need to know about Xarelto? The dose is one 20 mg tablet taken ONCE A DAY with your evening meal.  DO NOT stop taking Xarelto without talking to the health care provider who prescribed the medication.  Refill your prescription for 20 mg tablets before you run out.  After discharge, you should have regular check-up appointments with your healthcare provider that is prescribing your Xarelto.  In the future your dose may need to be changed if your kidney function changes by a significant amount.  What do you do if you miss a dose? If you are taking Xarelto TWICE DAILY and you miss a dose, take it as soon as you remember. You may take two 15 mg tablets (total 30 mg) at the same time then resume your regularly scheduled 15 mg twice daily the next day.  If you are taking Xarelto ONCE DAILY and you miss a dose, take it as soon as you remember on the same day then continue your regularly scheduled once daily regimen the next day. Do not take two doses of Xarelto at the same time.   Important Safety Information Xarelto is a blood thinner medicine that can cause bleeding. You should call your healthcare provider right away if you experience any of the following: ? Bleeding from an injury or your nose that does not stop. ? Unusual colored urine (red or dark brown) or unusual colored stools (red or black). ? Unusual bruising for unknown reasons. ? A serious fall or if you hit your head (even if  there is no bleeding).  Some medicines may interact with Xarelto and might increase your risk of bleeding while on Xarelto. To help avoid this, consult your healthcare provider or pharmacist prior to using any new prescription or non-prescription medications, including herbals, vitamins, non-steroidal anti-inflammatory drugs (NSAIDs) and supplements.  This website has more information on Xarelto: [email or web address]

## 2016-05-19 NOTE — Care Management Obs Status (Signed)
[place] NOTIFICATION   Patient Details  Name: Steve Brown MRN: [id number] Date of Birth: 05-17-1940   Medicare Observation Status Notification Given:  Yes    Sharin Mons, RN 05/19/2016, 4:18 PM

## 2016-05-19 NOTE — Care Management Note (Signed)
Case Management Note  Patient Details  Name: Steve Brown MRN: [id number] Date of Birth: 1940/03/24  Subjective/Objective:                  Presents with syncopal episode/ARF,history of CAD, PE, CKD stage II, HTN, CVA, and poorly controlled IDDM.  Koda Routon (Daughter) Deatra James [facility] [phone]      PCP: Lona Kettle  Action/Plan: PT/OT evaluations pending.... CM to f/u with disposition needs.  Expected Discharge Date:                  Expected Discharge Plan:  Home/Self Care  In-House Referral:     Discharge planning Services  CM Consult  Post Acute Care Choice:    Choice offered to:     DME Arranged:    DME Agency:     HH Arranged:    HH Agency:     Status of Service:  In process, will continue to follow  If discussed at Long Length of Stay Meetings, dates discussed:    Additional Comments:  Sharin Mons, RN 05/19/2016, 11:49 AM

## 2016-05-19 NOTE — Progress Notes (Signed)
Steve Brown to be D/C'd Home per MD order.  Discussed with the patient and all questions fully answered.  VSS, Skin clean, dry and intact without evidence of skin break down, no evidence of skin tears noted. IV catheter discontinued intact. Site without signs and symptoms of complications. Dressing and pressure applied.  An After Visit Summary was printed and given to the patient. Patient received prescription.  D/c education completed with patient/family including follow up instructions, medication list, d/c activities limitations if indicated, with other d/c instructions as indicated by MD - patient able to verbalize understanding, all questions fully answered.   Patient instructed to return to ED, call 911, or call MD for any changes in condition.   Patient escorted via [name], and D/C home via private auto.  [name] 05/19/2016 8:37 PM

## 2016-05-19 NOTE — Progress Notes (Signed)
Hypoglycemic Event  CBG: 41  Treatment: 15 GM carbohydrate snack  Symptoms: Sweaty, shaky  Follow-up CBG: Time: 0200 CBG Result:81  Possible Reasons for Event: Unknown  Comments/MD notified:    Candace Gallus

## 2016-05-22 DIAGNOSIS — E119 Type 2 diabetes mellitus without complications: Secondary | ICD-10-CM | POA: Diagnosis not present

## 2016-05-23 DIAGNOSIS — M79602 Pain in left arm: Secondary | ICD-10-CM | POA: Diagnosis not present

## 2016-05-23 DIAGNOSIS — N179 Acute kidney failure, unspecified: Secondary | ICD-10-CM | POA: Diagnosis not present

## 2016-06-01 ENCOUNTER — Ambulatory Visit (INDEPENDENT_AMBULATORY_CARE_PROVIDER_SITE_OTHER): Payer: Medicare Other | Admitting: Interventional Cardiology

## 2016-06-01 ENCOUNTER — Encounter: Payer: Self-pay | Admitting: Interventional Cardiology

## 2016-06-01 ENCOUNTER — Encounter (INDEPENDENT_AMBULATORY_CARE_PROVIDER_SITE_OTHER): Payer: Self-pay

## 2016-06-01 VITALS — BP 110/62 | HR 72 | Ht 65.0 in | Wt 190.4 lb

## 2016-06-01 DIAGNOSIS — R55 Syncope and collapse: Secondary | ICD-10-CM | POA: Diagnosis not present

## 2016-06-01 DIAGNOSIS — E782 Mixed hyperlipidemia: Secondary | ICD-10-CM | POA: Diagnosis not present

## 2016-06-01 DIAGNOSIS — R42 Dizziness and giddiness: Secondary | ICD-10-CM

## 2016-06-01 DIAGNOSIS — I2510 Atherosclerotic heart disease of native coronary artery without angina pectoris: Secondary | ICD-10-CM

## 2016-06-01 NOTE — Progress Notes (Signed)
Cardiology Office Note   Date:  06/01/2016   ID:  Steve Brown, DOB 1940-11-06, MRN [id number]  PCP:  Lawerance Cruel, MD    No chief complaint on file.    Wt Readings from Last 3 Encounters:  06/01/16 190 lb 6.4 oz (86.4 kg)  05/18/16 196 lb 10.4 oz (89.2 kg)  05/17/16 187 lb 6.4 oz (85 kg)       History of Present Illness: Steve Brown is a 76 y.o. male  who had an LAD stent in 2010. 2012 cath showed patent stent and no significant CAD. He had bilateral PE in 7/14. He has been on Xarelto ever since. He had a cath showing moderate disease which was negative by FFR in 2014.  No further CP. He had neck surgery in 2004.No cardiac problems at that time.   He exercises regularly at the [facility].  [facility].  He was at a picnic and got dizzy in 5/18.  He had a presyncopal spell.  He was admitted to the hospital.  It was thought to be due to dehydration.  He was rehydrated and Cr improved.  4/18 echo showed normal LV function.  He has had some UTIs as well.  He had some blood in th urine at times.    He is tolerating his Xarelto at this time.   Since May 11,2018: Denies : Chest pain. Dizziness. Leg edema. Nitroglycerin use. Orthopnea. Palpitations. Paroxysmal nocturnal dyspnea. Shortness of breath. Syncope       Past Medical History:  Diagnosis Date  . Benign localized hyperplasia of prostate without urinary obstruction and other lower urinary tract symptoms (LUTS)   . Blood dyscrasia   . CKD (chronic kidney disease), stage II   . Coronary artery disease   . Degeneration of cervical intervertebral disc   . Depression   . Diabetes mellitus   . Dyspnea    with exertion  . GERD (gastroesophageal reflux disease)   . History of TIAs   . Hypersomnia with sleep apnea, unspecified   . Hypertension   . Lumbago   . Neuropathy   . Other and unspecified hyperlipidemia   . Pain in joint, multiple sites   . PE (pulmonary  thromboembolism) ([place])   . Personal history of unspecified circulatory disease   . Pneumonia 2005   2010  . Sleep apnea   . Stroke [facility]) 2002, 2003   both sided weakness  . Syncope 04/25/2016  . Trigger finger of both hands 11-17-13  . Type II or unspecified type diabetes mellitus without mention of complication, not stated as uncontrolled   . Unspecified cardiovascular disease   . Unspecified essential hypertension   . Unspecified fall   . Unsteady gait 05/19/2013   X 24 hours - improving. Hx prior CVA/TIAs; multiple risk factors; on Xarelto    Past Surgical History:  Procedure Laterality Date  . CARDIAC CATHETERIZATION    . CARPAL TUNNEL RELEASE     bilateral  . CERVICAL DISC SURGERY    . CHOLECYSTECTOMY N/A 11/02/2015   Procedure: LAPAROSCOPIC CHOLECYSTECTOMY WITH INTRAOPERATIVE CHOLANGIOGRAM;  Surgeon: Donnie Mesa, MD;  Location: [place];  Service: General;  Laterality: N/A;  . COLONOSCOPY    . CORONARY ANGIOPLASTY    . CORONARY STENT PLACEMENT  Feb. 2010  . ELBOW SURGERY     right elbow - nerve release  . EYE SURGERY Bilateral    cataract  . HERNIA REPAIR  umbicical  . LEFT HEART CATHETERIZATION WITH CORONARY ANGIOGRAM N/A 04/03/2013   Procedure: LEFT HEART CATHETERIZATION WITH CORONARY ANGIOGRAM;  Surgeon: Jettie Booze, MD;  Location: [facility] CATH LAB;  Service: Cardiovascular;  Laterality: N/A;  . lungs     "fluid pumped off lungs"     Current Outpatient Prescriptions  Medication Sig Dispense Refill  . Ascorbic Acid (VITAMIN C PO) Take 1 tablet by mouth daily.    Marland Kitchen aspirin EC 81 MG tablet Take 1 tablet (81 mg total) by mouth daily.    . BD PEN NEEDLE NANO U/F 32G X 4 MM MISC by Does not apply route.     . bisacodyl (DULCOLAX) 5 MG EC tablet Take 5 mg by mouth daily as needed for moderate constipation.    . cholecalciferol (VITAMIN D) 1000 UNITS tablet Take 1,000 Units by mouth daily.    Marland Kitchen dutasteride (AVODART) 0.5 MG capsule Take 0.5 mg by mouth daily.    .  furosemide (LASIX) 20 MG tablet Take 20 mg by mouth daily as needed (swelling).     Marland Kitchen HUMALOG MIX 75/25 KWIKPEN (75-25) 100 UNIT/ML Kwikpen Inject 18-20 Units into the skin 2 (two) times daily. 18 units in evening, and 20 units in the morning.  1  . memantine (NAMENDA) 10 MG tablet Take 1 tablet (10 mg total) by mouth 2 (two) times daily. 60 tablet 3  . nitroGLYCERIN (NITROSTAT) 0.4 MG SL tablet Place 1 tablet (0.4 mg total) under the tongue every 5 (five) minutes as needed for chest pain. 25 tablet 12  . pantoprazole (PROTONIX) 40 MG tablet Take 40 mg by mouth daily.    . pregabalin (LYRICA) 300 MG capsule Take 1 capsule (300 mg total) by mouth 2 (two) times daily. 180 capsule 3  . ramipril (ALTACE) 2.5 MG capsule Take 2.5 mg by mouth daily.  1  . repaglinide (PRANDIN) 1 MG tablet Take 1 mg by mouth daily as needed.    . rivaroxaban (XARELTO) 20 MG TABS tablet Take 20 mg by mouth daily.     . rosuvastatin (CRESTOR) 10 MG tablet Take 10 mg by mouth every evening.     . sulfamethoxazole-trimethoprim (BACTRIM DS,SEPTRA DS) 800-160 MG tablet Take 1 tablet by mouth 2 (two) times daily.    . tamsulosin (FLOMAX) 0.4 MG CAPS capsule Take 0.4 mg by mouth daily.    . valACYclovir (VALTREX) 500 MG tablet Take 500 mg by mouth daily.    . metFORMIN (GLUCOPHAGE-XR) 500 MG 24 hr tablet Take 500 mg by mouth 2 (two) times daily.     No current facility-administered medications for this visit.     Allergies:   Other and Phenergan [promethazine]    Social History:  The patient  reports that he has never smoked. He has never used smokeless tobacco. He reports that he does not drink alcohol or use drugs.   Family History:  The patient's family history includes Aneurysm in his mother; Cancer in his father.    ROS:  Please see the history of present illness.   Otherwise, review of systems are positive for recent presyncope.   All other systems are reviewed and negative.    PHYSICAL EXAM: VS:  BP 110/62 (BP  Location: Right Arm, Patient Position: Sitting, Cuff Size: Normal)   Pulse 72   Ht 5\' 5"  (1.651 m)   Wt 190 lb 6.4 oz (86.4 kg)   SpO2 96%   BMI 31.68 kg/m  , BMI Body mass index is  31.68 kg/m. GEN: Well nourished, well developed, in no acute distress  HEENT: normal  Neck: no JVD, carotid bruits, or masses Cardiac: RRR; no murmurs, rubs, or gallops,no edema  Respiratory:  clear to auscultation bilaterally, normal work of breathing GI: soft, nontender, nondistended, + BS MS: no deformity or atrophy  Skin: warm and dry, no rash Neuro:  Strength and sensation are intact Psych: euthymic mood, full affect   EKG:   The ekg ordered 05/18/16 demonstrates NSR. No significant ST changes   Recent Labs: 05/18/2016: ALT 22 05/19/2016: BUN 13; Creatinine, Ser 1.28; Hemoglobin 12.2; Magnesium 1.9; Platelets 173; Potassium 5.3; Sodium 138   Lipid Panel    Component Value Date/Time   CHOL 144 03/21/2013 0400   TRIG 122 03/21/2013 0400   HDL 57 03/21/2013 0400   CHOLHDL 2.5 03/21/2013 0400   VLDL 24 03/21/2013 0400   LDLCALC 63 03/21/2013 0400     Other studies Reviewed: Additional studies/ records that were reviewed today with results demonstrating: *echo as noted above.   ASSESSMENT AND PLAN:  1. CAD: No angina on medical therapy. Continue aggressive secondary prevention.  Will consider stopping aspirin if he continues on the referral to band. 2. Hyperlipidemia: Continue lipid-lowering therapy. Lipids well controlled. 3. Presyncope: He remembers everything from the episode at the picnic. He remembers what people were saying.  He was likely dehydrated. If his blood pressure stays low, would stop ramus. He is on for renal protection in the setting of diabetes but if he is having low blood pressure symptoms, he may not tolerate it.   Current medicines are reviewed at length with the patient today.  The patient concerns regarding his medicines were addressed.  The following changes have  been made:  No change  Labs/ tests ordered today include:  No orders of the defined types were placed in this encounter.   Recommend 150 minutes/week of aerobic exercise Low fat, low carb, high fiber diet recommended  Disposition:   FU in as scheduled in 4 months   Signed, Larae Grooms, MD  06/01/2016 1:31 PM    [facility] [place], [place], [place]  [address] Phone: [phone]; Fax: [phone]

## 2016-06-01 NOTE — Patient Instructions (Signed)
Medication Instructions:  Your physician recommends that you continue on your current medications as directed. Please refer to the Current Medication list given to you today.   Labwork: None ordered  Testing/Procedures: None ordered  Follow-Up: Your physician wants you to follow-up in: October with Dr. Varanasi. You will receive a reminder letter in the mail two months in advance. If you don't receive a letter, please call our office to schedule the follow-up appointment.   Any Other Special Instructions Will Be Listed Below (If Applicable).     If you need a refill on your cardiac medications before your next appointment, please call your pharmacy.   

## 2016-06-06 ENCOUNTER — Other Ambulatory Visit: Payer: Self-pay

## 2016-06-06 MED ORDER — MEMANTINE HCL 10 MG PO TABS: 10.0000 mg | ORAL_TABLET | Freq: Two times a day (BID) | ORAL | 1 refills | Status: DC

## 2016-06-07 ENCOUNTER — Ambulatory Visit: Payer: Medicare Other | Admitting: Interventional Cardiology

## 2016-06-12 DIAGNOSIS — R3121 Asymptomatic microscopic hematuria: Secondary | ICD-10-CM | POA: Diagnosis not present

## 2016-06-16 DIAGNOSIS — Z79899 Other long term (current) drug therapy: Secondary | ICD-10-CM | POA: Diagnosis not present

## 2016-06-16 DIAGNOSIS — G894 Chronic pain syndrome: Secondary | ICD-10-CM | POA: Diagnosis not present

## 2016-06-16 DIAGNOSIS — Z79891 Long term (current) use of opiate analgesic: Secondary | ICD-10-CM | POA: Diagnosis not present

## 2016-06-16 DIAGNOSIS — M542 Cervicalgia: Secondary | ICD-10-CM | POA: Diagnosis not present

## 2016-06-16 DIAGNOSIS — M79606 Pain in leg, unspecified: Secondary | ICD-10-CM | POA: Diagnosis not present

## 2016-06-16 DIAGNOSIS — M545 Low back pain: Secondary | ICD-10-CM | POA: Diagnosis not present

## 2016-06-20 ENCOUNTER — Other Ambulatory Visit: Payer: Self-pay | Admitting: Urology

## 2016-06-20 DIAGNOSIS — R972 Elevated prostate specific antigen [PSA]: Secondary | ICD-10-CM

## 2016-06-20 DIAGNOSIS — N5201 Erectile dysfunction due to arterial insufficiency: Secondary | ICD-10-CM | POA: Diagnosis not present

## 2016-06-20 DIAGNOSIS — R351 Nocturia: Secondary | ICD-10-CM | POA: Diagnosis not present

## 2016-06-27 DIAGNOSIS — I10 Essential (primary) hypertension: Secondary | ICD-10-CM | POA: Diagnosis not present

## 2016-06-27 DIAGNOSIS — E7800 Pure hypercholesterolemia, unspecified: Secondary | ICD-10-CM | POA: Diagnosis not present

## 2016-06-27 DIAGNOSIS — G609 Hereditary and idiopathic neuropathy, unspecified: Secondary | ICD-10-CM | POA: Diagnosis not present

## 2016-06-27 DIAGNOSIS — E1165 Type 2 diabetes mellitus with hyperglycemia: Secondary | ICD-10-CM | POA: Diagnosis not present

## 2016-07-13 DIAGNOSIS — G894 Chronic pain syndrome: Secondary | ICD-10-CM | POA: Diagnosis not present

## 2016-07-13 DIAGNOSIS — M79606 Pain in leg, unspecified: Secondary | ICD-10-CM | POA: Diagnosis not present

## 2016-07-13 DIAGNOSIS — Z79899 Other long term (current) drug therapy: Secondary | ICD-10-CM | POA: Diagnosis not present

## 2016-07-13 DIAGNOSIS — M5136 Other intervertebral disc degeneration, lumbar region: Secondary | ICD-10-CM | POA: Diagnosis not present

## 2016-07-13 DIAGNOSIS — M5417 Radiculopathy, lumbosacral region: Secondary | ICD-10-CM | POA: Diagnosis not present

## 2016-07-13 DIAGNOSIS — Z79891 Long term (current) use of opiate analgesic: Secondary | ICD-10-CM | POA: Diagnosis not present

## 2016-07-18 DIAGNOSIS — E1042 Type 1 diabetes mellitus with diabetic polyneuropathy: Secondary | ICD-10-CM | POA: Diagnosis not present

## 2016-08-01 ENCOUNTER — Encounter: Payer: Self-pay | Admitting: Neurology

## 2016-08-03 ENCOUNTER — Encounter: Payer: Self-pay | Admitting: Neurology

## 2016-08-03 ENCOUNTER — Ambulatory Visit (INDEPENDENT_AMBULATORY_CARE_PROVIDER_SITE_OTHER): Payer: Medicare Other | Admitting: Neurology

## 2016-08-03 VITALS — BP 93/50 | HR 66 | Ht 65.0 in | Wt 192.0 lb

## 2016-08-03 DIAGNOSIS — G4733 Obstructive sleep apnea (adult) (pediatric): Secondary | ICD-10-CM | POA: Diagnosis not present

## 2016-08-03 DIAGNOSIS — F0150 Vascular dementia without behavioral disturbance: Secondary | ICD-10-CM | POA: Diagnosis not present

## 2016-08-03 DIAGNOSIS — I2510 Atherosclerotic heart disease of native coronary artery without angina pectoris: Secondary | ICD-10-CM

## 2016-08-03 DIAGNOSIS — Z9989 Dependence on other enabling machines and devices: Secondary | ICD-10-CM | POA: Diagnosis not present

## 2016-08-03 NOTE — Progress Notes (Signed)
PATIENT: Steve Brown DOB: [birth year]  REASON FOR VISIT:  I have the nature of seeing Steve Brown today on 08/03/2016, he has achieved 80% compliance on his CPAP use was 5 hours and 4 minutes of daily use on average, his residual AHI is 1.3. He is using CPAP at a set pressure of 13 cm water with 3 cm EPR. He had become non compliant - and needed a new study for  Supplies and resetting of his CPAP>  He had undergone a new split-night titration study on 03/27/2016 also he had been followed in our sleep clinic since 2013. He has a history of multi-infarct cerebrovascular disease, hypertension, diabetes, and has been followed for CPAP compliance. His baseline AHI was confirmed at 60.1 per hour AHI, he had prolonged hypoxemia saturation below 89% was 79 minutes. He improved drastically with CPAP saturation equaled only 8 minutes, he had no PLMS, his AHI was reduced to 4.5. Sleep efficiency with CPAP was 98%! He still has nocturia 3-4 times, and he sleeps longer than he uses CPAP. Feels less fatigued.      This patient has been established as a sleep patient in our clinic since 2013, has a history of multi-infarct, hypertension, diabetes and was evaluated for neuropathy in 2015, was followed for CPAP compliance until last May when he was no longer compliant due to repeated sinus infections, hospitalizations etc. He stated today that he has not been able to reinitiate CPAP use since March 2017.   his primary care physician, Dr. Harrington Challenger, referred now for memory concerns, in a patient with known multi-infarct history but with a sliding result on his minimal mental status examinations. A note from January 5 of this year states that the patient had scored 26 out of 29 points in a high school educated African-American gentleman age 43. The patient had suffered multiple strokes and ongoing TIA events, image studies were quoted below. I used today a Montral cognitive assessment and he scored 24 out of 30 points  2 points less  Expected. The details of the exam results are copied to the physical exam note. He did particularly well with the trail making test but had difficulties with clock drawing and copying an image. His short-term memory was excellent 5 out of 5 words, he had trouble with serial 7- but he had a lifelong difficulties with mathematics all through HS.  Steve Brown scored 24 out of 30 points and a Montral cognitive assessment, placing him at the mild cognitive impairment. He does have some amnestic events, describes how he cannot remember as a sermon after church visit, he does not have orientation difficulties such as date time place. And remarkably he will could recall 5 out of 5 words. This is not typical for short-term memory loss either. He did have visual spatial difficulties with clock drawing and copying a cube. He is very selective and partial difficulties may well be related to his previous strokes. He also is chronically anticoagulated to prevent strokes I did think we need to repeat an imaging study of the brain - I suspected that this is a vascular or infarct related memory loss rather than Alzheimer's disease.   It was after review of his notes from Dr Caroline Sauger at [facility] that I noticed that he had just been reevaluated for new stroke at the end of last year including imaging studies. I do not need to repeat any. His cognitive deficits are  explained with a multi-infarct dementia.  11-18-2015 CD  Steve Brown is a 76 year old male with a history of obstructive sleep apnea on CPAP. He returns today for follow-up. His last download from May 2017i ndicated that he uses machine 27 out of 30 days for compliance of 90%. He stopped using his CPAP at the end of March and again in June 2017, had felt sick for while, finally underwent gallbladder surgery after month of abdominal pain. The gallbladder inflammation did cause some sleep interruption and some discomfort at night, He was tossing and  turning and also rubbing alcohol on his belly to relieve the symptoms. Now since the surgery he has regained the ability to sleep more soundly and is wanting to return to CPAP use. His daughter suffered a stroke earlier this month, he is worried. For the months of June he has a compliance of 22 out of 30 days 75% for 6 hours and 13 minutes on average use the 9 cm setting with an AHI of 0.9. He also reports that he has a hard time sleeping on his back. Therefore when he sleeps on his sides occasionally the pillow will push the mask off his face causing a leak.  He reports that he does try to exercise daily. He has continued on Xarelto and aspirin for stroke prevention. His primary care is managing his hypertension and hyperlipidemia. He denies any additional strokelike symptoms.   UPDATE 05/18/14 Steve Brown, who was diagnosed with severe sleep apnea at an AHI of 65.9, has made efforts to increase his CPAP compliance. CPAP was initiated to reduce his secondary CVA risk.  He has used the machine over the last 30 days still only on 18 days, and all these days over 4 hours his compliance is 53% his average user time is 6 hours and 18 minutes his EPR is 1 cm and a set pressure 9 cm water unchanged from the last year. His residual AHI was 0.9 documenting that the setting is effective. Fatigue severity score was only 18 and sleepiness score 6 points again I at contribute to the lower sleepiness score also to his use of CPAP. He stated that he had some sinusitis problems that kept him from using CPAP and has just seen Dr. Ernesto Rutherford who prescribed no antibiotics after other measures had failed. He is reluctant to use an anti-decongestant which I would also not recommend because it can spike his blood pressure. He has Flonase which Dr. Harrington Challenger his primary care physician has prescribed for him. I will see him again in one year for CPAP compliance visit but urged him to try to use the machine every night or at least if he  has a medical reason not to use it to discontinue the use for 3 or 4 days until he recovers and then start again. He has also gained some weight which is a risk factor for obstructive sleep apnea alongside his retrognathia. He is now 185 pounds.   HISTORY HISTORY 08/14/13 (CD): Steve Brown is meanwhile 76 years old and has been staying active. He has controlled his weight he is physically active, he also has undergone a complete course of physical therapy since last visit. After out last visit, we had obtained an MRI of the brain without contrast and compared to the study from earlier the same year : there was no change in comparison to the May CT on this MRI dated 07-04-13 . The patient has few scattered small vessel injuries and some lacunar infarcts.  The main risk factor for  these as hypertension and diabetes- Occupational therapy saw him for left arm pain. Additional risk factor is the patient's diabetes mellitus condition. I referred him for occupational therapy to get further insight in what may have caused his arm pain.  He noted that he could left pounds with the left 3 pounds with her right hand now there is still a significant difference .  resulting in the conduction study and EMG study which was then performed on 07-09-13, normal study no evidence of large fiber neuropathy in the left upper extremity deltoid, biceps, triceps and flexor carpi radialis as well as interosseus Muscles; no abnormal EMG activity.  He underwent a sleep study at [facility] : results from 08-07-13 reviewed today :  Steve Brown was diagnosed with severe sleep apnea at an AHI of 65.9. Lowest point of oxygen saturation was 70%. He was titrated to CPAP at 9 cm water he slept 92.7 minutes at that pressure of which 23 minutes of REM sleep. The AHI was now 0.6 per hour. The previously very fragmented sleep architecture became now essentially normal. The technologist used a PIC all equal nasal mask. The patient is asked to  return in about 50 days for a followup with the CPAP machine.  I also reviewed his long medication list : patient has been on Xeralto for chronic anticoagulation.  REVIEW OF SYSTEMS: Out of a complete 14 system review of symptoms, the patient complains only of the following symptoms, and all other reviewed systems are negative.  "i cannot recall the Sunday sermon. Non compliant with CPAP- 11 month on and off.   ALLERGIES: Allergies  Allergen Reactions  . Other Other (See Comments)    Per patient- cardiac cath dye-  "woke up during procedure hysterical."  . Phenergan [Promethazine] Other (See Comments)    Mood changes     HOME MEDICATIONS: Outpatient Medications Prior to Visit  Medication Sig Dispense Refill  . Ascorbic Acid (VITAMIN C PO) Take 1 tablet by mouth daily.    Marland Kitchen aspirin EC 81 MG tablet Take 1 tablet (81 mg total) by mouth daily.    . BD PEN NEEDLE NANO U/F 32G X 4 MM MISC by Does not apply route.     . bisacodyl (DULCOLAX) 5 MG EC tablet Take 5 mg by mouth daily as needed for moderate constipation.    . cholecalciferol (VITAMIN D) 1000 UNITS tablet Take 1,000 Units by mouth daily.    Marland Kitchen dutasteride (AVODART) 0.5 MG capsule Take 0.5 mg by mouth daily.    . furosemide (LASIX) 20 MG tablet Take 20 mg by mouth daily as needed (swelling).     Marland Kitchen HUMALOG MIX 75/25 KWIKPEN (75-25) 100 UNIT/ML Kwikpen Inject 18-20 Units into the skin 2 (two) times daily. 18 units in evening, and 20 units in the morning.  1  . memantine (NAMENDA) 10 MG tablet Take 1 tablet (10 mg total) by mouth 2 (two) times daily. 180 tablet 1  . metFORMIN (GLUCOPHAGE-XR) 500 MG 24 hr tablet Take 500 mg by mouth 2 (two) times daily.    . nitroGLYCERIN (NITROSTAT) 0.4 MG SL tablet Place 1 tablet (0.4 mg total) under the tongue every 5 (five) minutes as needed for chest pain. 25 tablet 12  . pantoprazole (PROTONIX) 40 MG tablet Take 40 mg by mouth daily.    . pregabalin (LYRICA) 300 MG capsule Take 1 capsule (300 mg  total) by mouth 2 (two) times daily. 180 capsule 3  . ramipril (ALTACE) 2.5 MG  capsule Take 2.5 mg by mouth daily.  1  . repaglinide (PRANDIN) 1 MG tablet Take 1 mg by mouth daily as needed.    . rivaroxaban (XARELTO) 20 MG TABS tablet Take 20 mg by mouth daily.     . rosuvastatin (CRESTOR) 10 MG tablet Take 10 mg by mouth every evening.     . sulfamethoxazole-trimethoprim (BACTRIM DS,SEPTRA DS) 800-160 MG tablet Take 1 tablet by mouth 2 (two) times daily.    . tamsulosin (FLOMAX) 0.4 MG CAPS capsule Take 0.4 mg by mouth daily.    . valACYclovir (VALTREX) 500 MG tablet Take 500 mg by mouth daily.     No facility-administered medications prior to visit.     PAST MEDICAL HISTORY: Past Medical History:  Diagnosis Date  . Benign localized hyperplasia of prostate without urinary obstruction and other lower urinary tract symptoms (LUTS)   . Blood dyscrasia   . CKD (chronic kidney disease), stage II   . Coronary artery disease   . Degeneration of cervical intervertebral disc   . Depression   . Diabetes mellitus   . Dyspnea    with exertion  . GERD (gastroesophageal reflux disease)   . History of TIAs   . Hypersomnia with sleep apnea, unspecified   . Hypertension   . Lumbago   . Neuropathy   . Other and unspecified hyperlipidemia   . Pain in joint, multiple sites   . PE (pulmonary thromboembolism) ([place])   . Personal history of unspecified circulatory disease   . Pneumonia 2005   2010  . Sleep apnea   . Stroke [facility]) 2002, 2003   both sided weakness  . Syncope 04/25/2016  . Trigger finger of both hands 11-17-13  . Type II or unspecified type diabetes mellitus without mention of complication, not stated as uncontrolled   . Unspecified cardiovascular disease   . Unspecified essential hypertension   . Unspecified fall   . Unsteady gait 05/19/2013   X 24 hours - improving. Hx prior CVA/TIAs; multiple risk factors; on Xarelto    PAST SURGICAL HISTORY: Past Surgical History:    Procedure Laterality Date  . CARDIAC CATHETERIZATION    . CARPAL TUNNEL RELEASE     bilateral  . CERVICAL DISC SURGERY    . CHOLECYSTECTOMY N/A 11/02/2015   Procedure: LAPAROSCOPIC CHOLECYSTECTOMY WITH INTRAOPERATIVE CHOLANGIOGRAM;  Surgeon: Donnie Mesa, MD;  Location: [facility];  Service: General;  Laterality: N/A;  . COLONOSCOPY    . CORONARY ANGIOPLASTY    . CORONARY STENT PLACEMENT  Feb. 2010  . ELBOW SURGERY     right elbow - nerve release  . EYE SURGERY Bilateral    cataract  . HERNIA REPAIR     umbicical  . LEFT HEART CATHETERIZATION WITH CORONARY ANGIOGRAM N/A 04/03/2013   Procedure: LEFT HEART CATHETERIZATION WITH CORONARY ANGIOGRAM;  Surgeon: Jettie Booze, MD;  Location: [facility] CATH LAB;  Service: Cardiovascular;  Laterality: N/A;  . lungs     "fluid pumped off lungs"    FAMILY HISTORY: Family History  Problem Relation Age of Onset  . Aneurysm Mother   . Cancer Father   . Heart attack Neg Hx     SOCIAL HISTORY: Social History   Social History  . Marital status: Divorced    Spouse name: N/A  . Number of children: 4  . Years of education: 12   Occupational History  .  Retired    retired   Social History Main Topics  . Smoking status: Never Smoker  .  Smokeless tobacco: Never Used  . Alcohol use No  . Drug use: No  . Sexual activity: Not on file   Other Topics Concern  . Not on file   Social History Narrative   Patient lives at home alone and he is single.  Patient is retired.    Caffeine - one cups daily.   Right handed.   Patient has a high school education.   Patient has four adult children.      PHYSICAL EXAM  Vitals:   08/03/16 1434  BP: (!) 93/50  Pulse: 66  Weight: 192 lb (87.1 kg)  Height: 5\' 5"  (1.651 m)   Body mass index is 31.95 kg/m.  Generalized: Well developed, in no acute distress   Neurological examination  Mentation: Alert oriented to time, place, history taking.  Montreal Cognitive Assessment  02/21/2016   Visuospatial/ Executive (0/5) 3  Naming (0/3) 3  Attention: Read list of digits (0/2) 2  Attention: Read list of letters (0/1) 0  Attention: Serial 7 subtraction starting at 100 (0/3) 0  Language: Repeat phrase (0/2) 2  Language : Fluency (0/1) 1  Abstraction (0/2) 2  Delayed Recall (0/5) 5  Orientation (0/6) 5  Total 23  Adjusted Score (based on education) 24    Follows all commands speech and language fluent Cranial nerve :  Taste and smell preserved , Pupils were equal round reactive to light. Extraocular movements were full, visual field were full on confrontational test. Facial sensation and strength were normal. Uvula tongue midline. Mallampati 3+ Head turning and shoulder shrug were normal and symmetric.Motor:   symmetric motor tone is noted throughout, no hemiparesis or spasticity. .  Sensory: Sensory testing is intact to soft touch on all 4 extremities. Gait and station: Gait is normal. No drift is seen. Reflexes: Deep tendon reflexes are symmetric  bilaterally.   DIAGNOSTIC DATA (LABS, IMAGING, TESTING) - I reviewed patient records, labs, notes, testing and imaging myself where available.   Lab Results  Component Value Date   WBC 4.3 05/19/2016   HGB 12.2 (L) 05/19/2016   HCT 36.9 (L) 05/19/2016   MCV 91.3 05/19/2016   PLT 173 05/19/2016    ASSESSMENT AND PLAN 76 y.o. year old male who has been followed for CPAP compliance in the sleep clinic of the [place] office,  has now been non compliant with CPAP since March 2017, almost a year. I cannot supply new parts for CPAP under these circumstance - non compliance.   1. Obstructive sleep apnea - now again compliant with CPAP, should adhere to daily use at a minimum of 4 hours.    Multi infarct dementia- stroke prevention is main goal, I will ask him to keep his follow ups with Dr. Caroline Sauger at [facility] neuro-vascular clinic. He is at higher risk of embolic strokes as long as OSA was untreated. He  wanted to restart CPAP  therapy and he will from now on see my NP once a year.   Larey Seat, MD  08/03/2016, 3:20 PM [facility] [address] [place], [place] [address] [phone]

## 2016-08-03 NOTE — Patient Instructions (Signed)
Memory disturbance   means losing some of your brain ability.  People with dementia may have problems with:  Memory.  Making decisions.  Behavior.  Speaking.  Thinking.  Solving problems.  Follow these instructions at home: Medicine  Take over-the-counter and prescription medicines only as told by your doctor.  Avoid taking medicines that can change how you think. These include pain or sleeping medicines. Lifestyle   Make healthy choices: ? Be active as told by your doctor. ? Do not use any tobacco products, such as cigarettes, chewing tobacco, and e-cigarettes. If you need help quitting, ask your doctor. ? Eat a healthy diet. ? When you get stressed, do something to help yourself relax. Your doctor can give you tips. ? Spend time with other people.  Drink enough fluid to keep your pee (urine) clear or pale yellow.  Make sure you get good sleep. Use these tips to help you get a good night's rest: ? Try not to take naps during the day. ? Keep your sleeping area dark and cool. ? In the few hours before you go to bed, try not to do any exercise. ? Try not to have foods and drinks with caffeine in the evening. General instructions  Talk with your doctor to figure out: ? What you need help with. ? What your safety needs are.  If you were given a bracelet that tracks your location, make sure to wear it.  Keep all follow-up visits as told by your doctor. This is important. Contact a doctor if:  You have any new problems.  You have problems with choking or swallowing.  You have any symptoms of a different sickness. Get help right away if:  You have a fever.  You feel mixed up (confused) or more mixed up than before.  You have new sleepiness.  You have sleepiness that gets worse.  You have a hard time staying awake.  You or your family members are worried for your safety. This information is not intended to replace advice given to you by your health care  provider. Make sure you discuss any questions you have with your health care provider. Document Released: 12/09/2007 Document Revised: 06/03/2015 Document Reviewed: 09/23/2014 Elsevier Interactive Patient Education  [employer].

## 2016-08-03 NOTE — Addendum Note (Signed)
Addended by: Larey Seat on: 08/03/2016 03:34 PM   Modules accepted: Orders

## 2016-08-17 ENCOUNTER — Encounter (HOSPITAL_BASED_OUTPATIENT_CLINIC_OR_DEPARTMENT_OTHER): Payer: Self-pay | Admitting: *Deleted

## 2016-08-17 ENCOUNTER — Other Ambulatory Visit: Payer: Self-pay | Admitting: Urology

## 2016-08-23 ENCOUNTER — Encounter (HOSPITAL_BASED_OUTPATIENT_CLINIC_OR_DEPARTMENT_OTHER): Payer: Self-pay | Admitting: *Deleted

## 2016-08-23 NOTE — Progress Notes (Signed)
To [facility] [place] on arrival,to complete fleet enema in am.Ekg with chart-Per Dr Louis Meckel pt will not need to stop Xarelto.Npo after Mn-may take protonix with a sip of water.

## 2016-08-24 DIAGNOSIS — N390 Urinary tract infection, site not specified: Secondary | ICD-10-CM | POA: Diagnosis not present

## 2016-08-30 ENCOUNTER — Other Ambulatory Visit: Payer: Self-pay | Admitting: Student

## 2016-08-31 ENCOUNTER — Ambulatory Visit (HOSPITAL_BASED_OUTPATIENT_CLINIC_OR_DEPARTMENT_OTHER)
Admission: RE | Admit: 2016-08-31 | Discharge: 2016-08-31 | Disposition: A | Discharge status: Home / Self Care | Payer: Medicare Other | Source: Ambulatory Visit | Attending: Urology | Admitting: Urology

## 2016-08-31 ENCOUNTER — Encounter (HOSPITAL_BASED_OUTPATIENT_CLINIC_OR_DEPARTMENT_OTHER)
Admission: RE | Disposition: A | Discharge status: Home / Self Care | Payer: Self-pay | Source: Ambulatory Visit | Attending: Urology

## 2016-08-31 ENCOUNTER — Ambulatory Visit (HOSPITAL_BASED_OUTPATIENT_CLINIC_OR_DEPARTMENT_OTHER): Payer: Medicare Other | Admitting: Anesthesiology

## 2016-08-31 ENCOUNTER — Ambulatory Visit (HOSPITAL_COMMUNITY)
Admission: RE | Admit: 2016-08-31 | Discharge: 2016-08-31 | Disposition: A | Discharge status: Home / Self Care | Payer: Medicare Other | Source: Ambulatory Visit | Attending: Urology | Admitting: Urology

## 2016-08-31 ENCOUNTER — Encounter (HOSPITAL_BASED_OUTPATIENT_CLINIC_OR_DEPARTMENT_OTHER): Payer: Self-pay

## 2016-08-31 DIAGNOSIS — R972 Elevated prostate specific antigen [PSA]: Secondary | ICD-10-CM

## 2016-08-31 DIAGNOSIS — F329 Major depressive disorder, single episode, unspecified: Secondary | ICD-10-CM | POA: Insufficient documentation

## 2016-08-31 DIAGNOSIS — Z9989 Dependence on other enabling machines and devices: Secondary | ICD-10-CM | POA: Insufficient documentation

## 2016-08-31 DIAGNOSIS — I10 Essential (primary) hypertension: Secondary | ICD-10-CM | POA: Diagnosis not present

## 2016-08-31 DIAGNOSIS — G4730 Sleep apnea, unspecified: Secondary | ICD-10-CM | POA: Insufficient documentation

## 2016-08-31 DIAGNOSIS — Z79899 Other long term (current) drug therapy: Secondary | ICD-10-CM | POA: Diagnosis not present

## 2016-08-31 DIAGNOSIS — E119 Type 2 diabetes mellitus without complications: Secondary | ICD-10-CM | POA: Insufficient documentation

## 2016-08-31 DIAGNOSIS — Z7901 Long term (current) use of anticoagulants: Secondary | ICD-10-CM | POA: Diagnosis not present

## 2016-08-31 DIAGNOSIS — E785 Hyperlipidemia, unspecified: Secondary | ICD-10-CM | POA: Insufficient documentation

## 2016-08-31 DIAGNOSIS — Z794 Long term (current) use of insulin: Secondary | ICD-10-CM | POA: Insufficient documentation

## 2016-08-31 DIAGNOSIS — I2510 Atherosclerotic heart disease of native coronary artery without angina pectoris: Secondary | ICD-10-CM | POA: Insufficient documentation

## 2016-08-31 DIAGNOSIS — Z87440 Personal history of urinary (tract) infections: Secondary | ICD-10-CM | POA: Diagnosis not present

## 2016-08-31 DIAGNOSIS — Z6830 Body mass index (BMI) 30.0-30.9, adult: Secondary | ICD-10-CM | POA: Insufficient documentation

## 2016-08-31 DIAGNOSIS — Z888 Allergy status to other drugs, medicaments and biological substances status: Secondary | ICD-10-CM | POA: Diagnosis not present

## 2016-08-31 DIAGNOSIS — K219 Gastro-esophageal reflux disease without esophagitis: Secondary | ICD-10-CM | POA: Diagnosis not present

## 2016-08-31 DIAGNOSIS — C61 Malignant neoplasm of prostate: Secondary | ICD-10-CM | POA: Insufficient documentation

## 2016-08-31 DIAGNOSIS — F419 Anxiety disorder, unspecified: Secondary | ICD-10-CM | POA: Insufficient documentation

## 2016-08-31 HISTORY — DX: Type 2 diabetes mellitus without complications: E11.9

## 2016-08-31 HISTORY — DX: Other intervertebral disc degeneration, lumbar region without mention of lumbar back pain or lower extremity pain: M51.369

## 2016-08-31 HISTORY — DX: Obstructive sleep apnea (adult) (pediatric): G47.33

## 2016-08-31 HISTORY — PX: PROSTATE BIOPSY: SHX241

## 2016-08-31 HISTORY — DX: Elevated prostate specific antigen (PSA): R97.20

## 2016-08-31 HISTORY — DX: Occlusion and stenosis of left vertebral artery: I65.02

## 2016-08-31 HISTORY — DX: Cyst of kidney, acquired: N28.1

## 2016-08-31 HISTORY — DX: Personal history of pulmonary embolism: Z86.711

## 2016-08-31 HISTORY — DX: Hyperlipidemia, unspecified: E78.5

## 2016-08-31 HISTORY — DX: Dependence on other enabling machines and devices: Z99.89

## 2016-08-31 HISTORY — DX: Long term (current) use of anticoagulants: Z79.01

## 2016-08-31 HISTORY — DX: Personal history of transient ischemic attack (TIA), and cerebral infarction without residual deficits: Z86.73

## 2016-08-31 HISTORY — DX: Presence of coronary angioplasty implant and graft: Z95.5

## 2016-08-31 HISTORY — DX: Dyspnea, unspecified: R06.00

## 2016-08-31 HISTORY — DX: Benign prostatic hyperplasia without lower urinary tract symptoms: N40.0

## 2016-08-31 HISTORY — DX: Personal history of other specified conditions: Z87.898

## 2016-08-31 HISTORY — DX: Unspecified sequelae of cerebral infarction: I69.30

## 2016-08-31 HISTORY — DX: Occlusion and stenosis of bilateral carotid arteries: I65.23

## 2016-08-31 HISTORY — DX: Other intervertebral disc degeneration, lumbar region: M51.36

## 2016-08-31 HISTORY — DX: Reserved for concepts with insufficient information to code with codable children: IMO0002

## 2016-08-31 HISTORY — DX: Other forms of dyspnea: R06.09

## 2016-08-31 LAB — POCT I-STAT 4, (NA,K, GLUC, HGB,HCT)
Glucose, Bld: 247 mg/dL — ABNORMAL HIGH (ref 65–99)
HCT: 37 % — ABNORMAL LOW (ref 39.0–52.0)
Hemoglobin: 12.6 g/dL — ABNORMAL LOW (ref 13.0–17.0)
Potassium: 4.2 mmol/L (ref 3.5–5.1)
Sodium: 138 mmol/L (ref 135–145)

## 2016-08-31 LAB — GLUCOSE, CAPILLARY
Glucose-Capillary: 224 mg/dL — ABNORMAL HIGH (ref 65–99)
Glucose-Capillary: 223 mg/dL — ABNORMAL HIGH (ref 65–99)

## 2016-08-31 SURGERY — BIOPSY, PROSTATE, RECTAL APPROACH, WITH US GUIDANCE
Anesthesia: Monitor Anesthesia Care | Site: Prostate

## 2016-08-31 MED ORDER — CEFTRIAXONE SODIUM 1 G IJ SOLR
INTRAMUSCULAR | Status: AC
  Filled 2016-08-31: qty 10

## 2016-08-31 MED ORDER — DEXTROSE 5 % IV SOLN
1.0000 g | INTRAVENOUS | Status: AC
  Administered 2016-08-31: 1 g via INTRAVENOUS
  Filled 2016-08-31: qty 10

## 2016-08-31 MED ORDER — RIVAROXABAN 20 MG PO TABS
20.0000 mg | ORAL_TABLET | Freq: Every day | ORAL | Status: DC
Start: 2016-08-31 — End: 2017-05-25

## 2016-08-31 MED ORDER — LIDOCAINE 2% (20 MG/ML) 5 ML SYRINGE
INTRAMUSCULAR | Status: AC
Start: 2016-08-31 — End: 2016-08-31
  Filled 2016-08-31: qty 5

## 2016-08-31 MED ORDER — ACETAMINOPHEN 10 MG/ML IV SOLN
INTRAVENOUS | Status: AC
  Filled 2016-08-31: qty 100

## 2016-08-31 MED ORDER — DEXTROSE 5 % IV SOLN
INTRAVENOUS | Status: AC
  Filled 2016-08-31: qty 50

## 2016-08-31 MED ORDER — FLEET ENEMA 7-19 GM/118ML RE ENEM
1.0000 | ENEMA | Freq: Once | RECTAL | Status: AC
  Administered 2016-08-31: 1 via RECTAL
  Filled 2016-08-31: qty 1

## 2016-08-31 MED ORDER — FENTANYL CITRATE (PF) 100 MCG/2ML IJ SOLN
25.0000 ug | INTRAMUSCULAR | Status: DC | PRN
  Filled 2016-08-31: qty 1

## 2016-08-31 MED ORDER — SODIUM CHLORIDE 0.9 % IV SOLN
INTRAVENOUS | Status: DC
Start: 2016-08-31 — End: 2016-08-31
  Administered 2016-08-31: 07:00:00 via INTRAVENOUS
  Filled 2016-08-31: qty 1000

## 2016-08-31 MED ORDER — ONDANSETRON HCL 4 MG/2ML IJ SOLN
INTRAMUSCULAR | Status: DC | PRN
  Administered 2016-08-31: 4 mg via INTRAVENOUS

## 2016-08-31 MED ORDER — ONDANSETRON HCL 4 MG/2ML IJ SOLN
4.0000 mg | Freq: Once | INTRAMUSCULAR | Status: DC | PRN
  Filled 2016-08-31: qty 2

## 2016-08-31 MED ORDER — PROPOFOL 500 MG/50ML IV EMUL
INTRAVENOUS | Status: AC
Start: 2016-08-31 — End: 2016-08-31
  Filled 2016-08-31: qty 50

## 2016-08-31 MED ORDER — LIDOCAINE HCL 2 % IJ SOLN
INTRAMUSCULAR | Status: DC | PRN
  Administered 2016-08-31: 7 mL

## 2016-08-31 MED ORDER — ACETAMINOPHEN 10 MG/ML IV SOLN
INTRAVENOUS | Status: DC | PRN
  Administered 2016-08-31: 1000 mg via INTRAVENOUS

## 2016-08-31 MED ORDER — PROPOFOL 10 MG/ML IV BOLUS
INTRAVENOUS | Status: DC | PRN
  Administered 2016-08-31 (×3): 10 mg via INTRAVENOUS

## 2016-08-31 MED ORDER — FENTANYL CITRATE (PF) 100 MCG/2ML IJ SOLN
INTRAMUSCULAR | Status: DC | PRN
  Administered 2016-08-31 (×8): 12.5 ug via INTRAVENOUS

## 2016-08-31 MED ORDER — PROPOFOL 500 MG/50ML IV EMUL
INTRAVENOUS | Status: DC | PRN
Start: 2016-08-31 — End: 2016-08-31
  Administered 2016-08-31: 25 ug/kg/min via INTRAVENOUS

## 2016-08-31 MED ORDER — FENTANYL CITRATE (PF) 100 MCG/2ML IJ SOLN
INTRAMUSCULAR | Status: AC
  Filled 2016-08-31: qty 2

## 2016-08-31 MED ORDER — LIDOCAINE HCL (CARDIAC) 20 MG/ML IV SOLN
INTRAVENOUS | Status: DC | PRN
  Administered 2016-08-31: 60 mg via INTRAVENOUS

## 2016-08-31 SURGICAL SUPPLY — 14 items
GLOVE BIO SURGEON STRL SZ7.5 (GLOVE) IMPLANT
INST BIOPSY MAXCORE 18GX25 (NEEDLE) ×1 IMPLANT
INSTR BIOPSY MAXCORE 18GX20 (NEEDLE) IMPLANT
KIT RM TURNOVER CYSTO AR (KITS) ×2 IMPLANT
NDL SAFETY ECLIPSE 18X1.5 (NEEDLE) IMPLANT
NDL SPNL 22GX7 QUINCKE BK (NEEDLE) IMPLANT
NEEDLE HYPO 18GX1.5 SHARP (NEEDLE)
NEEDLE SPNL 22GX7 QUINCKE BK (NEEDLE) IMPLANT
SPONGE GAUZE 4X4 12PLY STER LF (GAUZE/BANDAGES/DRESSINGS) ×1 IMPLANT
SURGILUBE 2OZ TUBE FLIPTOP (MISCELLANEOUS) ×2 IMPLANT
SYR CONTROL 10ML LL (SYRINGE) IMPLANT
TOWEL OR 17X24 6PK STRL BLUE (TOWEL DISPOSABLE) IMPLANT
TUBE CONNECTING 12X1/4 (SUCTIONS) IMPLANT
UNDERPAD 30X30 INCONTINENT (UNDERPADS AND DIAPERS) ×3 IMPLANT

## 2016-08-31 NOTE — H&P (Signed)
Elevated PSA- Established Patient  HPI: Steve Brown is a 76 year-old male established patient who is here for follow up for further evaluation of his elevated PSA.  The patient was last seen 1 month. His last PSA was performed approximately 06/13/2016. The last PSA value was 5.57 (10%) free. The patient states he does take 5 alpha reductase inhibitor medication.   The patient complains of recent onset or worsening of his lower urinary tract symptom(s) that include frequency and nocturia. He takes Alfuzosin/Dutasteride for his lower urinary tract symptoms.   The patient has had a prostate MRI. The MRI was performed and showed: 2015 - no evidence of HG lesions. He has had a prostate biopsy done. Last prostate biopsy was: 06/21/2003. The patient does not have a family history of prostate cancer.   Hematuria eval in 2012 - negative  h/o elevated PSA (prostate MRI in 2015 negative), negative bx in 2004 and 2005.  Treated for UTI in March - presented with dysuria and gross hematuria. Recurrent or imcompletely treated prostatitis in May 2018 - treated with one month of bactrim DS.   hematuria eval 3/18 - cysto (b/l hutch diverticulum), prostatitis - o/w unremarkable.  May 10th he was hospitalized for dehydration. He has been taken off metformin and celebrex for now until he has another appointment with PCP.   Intv: Patient presents today after completing a month of bactrim. The patient states that the burning, foul-smelling, and urinary frequency has improved significantly. Denies any fevers or chills. Denies any ongoing gross hematuria.     AUA Symptom Score: He never has the sensation of not emptying his bladder completely after finishing urinating. He never has to urinate again less that two hours after he has finished urinating. He does not have to stop and start again several times when he urinates. He never finds it difficult to postpone urination. Less than 20% of the time he has a weak urinary  stream. He never has to push or strain to begin urination. He has to get up to urinate 3 times from the time he goes to bed until the time he gets up in the morning.   Calculated AUA Symptom Score: 4    ALLERGIES: Phenergan - Other Reaction, "drunk" feeling    MEDICATIONS: Dutasteride 0.5 mg capsule 1 capsule PO Daily  Tamsulosin Hcl 0.4 mg capsule, ext release 24 hr 1 capsule PO Daily  Crestor 10 MG Oral Tablet 1 Oral Daily  Furosemide 20 MG Oral Tablet Oral  Humalog 100 unit/ml vial Subcutaneous  Lyrica 300 MG Oral Capsule Oral  Nitrostat 0.4 mg tablet, sublingual Sublingual  Ramipril 2.5 mg capsule Oral  Valacyclovir 500 mg tablet Oral  Vitamin D3 1,000 unit tablet Oral  Voltaren 1 % gel External  Xarelto 20 mg tablet Oral     GU PSH: Cystoscopy - 05/09/2016      PSH Notes: Lung Surgery, Neck Surgery, Decompression Of Median Nerve At Carpal Tunnel   NON-GU PSH: Carpal Tunnel Surgery.. - 2009 Lung Surgery (Unspecified) - 2009    GU PMH: Acute prostatitis - 05/09/2016 Microscopic hematuria - 05/09/2016 Gross hematuria, The patient's gross hematuria is likely secondary to a urinary tract infection. I did: Bactrim today as well as into urine culture. We will plan to follow-up the urine culture to ensure that he is on the appropriate agent. The patient is planned to follow up with me in July with a PSA prior. We'll keep that appointment. - 03/27/2016, Gross hematuria, - 2015, Gross  Hematuria, - 2014 Elevated PSA - 09/07/2015, - 08/03/2015, Elevated prostate specific antigen (PSA), - 07/30/2014 Dysuria, Dysuria - 05/12/2015 Urinary Tract Inf, Unspec site, Suspected urinary tract infection - 05/12/2015, Urinary tract infection, - 2014, Pyuria, - 2014 Urinary Frequency, Urinary frequency - 02/17/2015 Low back pain, Lumbago - 2016 BPH w/LUTS, Benign localized prostatic hyperplasia with lower urinary tract symptoms (LUTS) - 2015 Nocturia, Nocturia - 2015 BPH w/o LUTS, Benign prostatic hypertrophy  without lower urinary tract symptoms - 2015 ED due to arterial insufficiency, Erectile dysfunction due to arterial insufficiency - 2014 Encounter for Prostate Cancer screening, Prostate cancer screening - 2014 Epididymitis, Epididymitis Right - 2014 Hydrocele, Unspec, Hydrocele, bilateral - 2014, Hydrocele, right, - 2014 Obstructive and reflux uropathy, Unspec, Obstructive uropathy - 2014 Other microscopic hematuria, Microscopic Hematuria - 2014 Personal Hx Oth Urinary System diseases, History of urinary tract obstruction - 2014 Renal cyst, Renal cyst, acquired - 2014      PMH Notes:  2007-11-11 13:17:01 - Note: Pneumonia  2011-07-04 14:11:38 - Note: Scrotal Pain   NON-GU PMH: Glycosuria, Glycosuria - 02/17/2015 Personal history of other specified conditions, History of brain tumor - 01/27/2015 Trichomoniasis, unspecified, Trichomoniasis - 07/30/2014 Encounter for general adult medical examination without abnormal findings, Encounter for preventive health examination - 2016 Arrhythmia, Rhythm Disorder - 2014 Decreased libido, Decreased libido - 2014 Gastric ulcer, unspecified as acute or chronic, without hemorrhage or perforation, Gastric Ulcer - 2014 Personal history of other diseases of the nervous system and sense organs, History of sleep apnea - 2014 Personal history of other endocrine, nutritional and metabolic disease, History of hypercholesterolemia - 2014, History of diabetes mellitus, - 2014 Personal history of transient ischemic attack (TIA), and cerebral infarction without residual deficits, History of transient cerebral ischemia - 2014    FAMILY HISTORY: Cerebral Artery Aneurysm - Mother Death In The Family Father - Runs In Family Death In The Family Mother - Runs In Family Family Health Status Number - Runs In Family   SOCIAL HISTORY: Marital Status: Married Current Smoking Status: Patient has never smoked.   Tobacco Use Assessment Completed: Used Tobacco in last 30  days? Has never drank.  Drinks 3 caffeinated drinks per day.     Notes: Never A Smoker, Marital History - Divorced, Retired From Work, Alcohol Use, Caffeine Use   REVIEW OF SYSTEMS:    GU Review Male:   Patient reports frequent urination, get up at night to urinate, leakage of urine, and erection problems. Patient denies hard to postpone urination, burning/ pain with urination, stream starts and stops, trouble starting your stream, have to strain to urinate , and penile pain.  Gastrointestinal (Upper):   Patient denies nausea, vomiting, and indigestion/ heartburn.  Gastrointestinal (Lower):   Patient denies diarrhea and constipation.  Constitutional:   Patient reports fatigue. Patient denies fever, night sweats, and weight loss.  Skin:   Patient denies skin rash/ lesion and itching.  Eyes:   Patient denies blurred vision and double vision.  Ears/ Nose/ Throat:   Patient denies sore throat and sinus problems.  Hematologic/Lymphatic:   Patient denies swollen glands and easy bruising.  Cardiovascular:   Patient denies leg swelling and chest pains.  Respiratory:   Patient denies cough and shortness of breath.  Endocrine:   Patient denies excessive thirst.  Musculoskeletal:   Patient reports joint pain and back pain.   Neurological:   Patient denies headaches and dizziness.  Psychologic:   Patient denies depression and anxiety.   VITAL SIGNS:  06/20/2016 10:53 AM  BP 135/67 mmHg  Pulse 60 /min   PAST DATA REVIEWED:  Source Of History:  Patient  Lab Test Review:   PSA  Records Review:   Previous Patient Records   06/12/16 07/27/15 06/19/14 06/28/13 01/06/13 05/23/12 10/16/11 07/05/11  PSA  Total PSA 5.57 ng/dl 4.37  3.63  3.74  3.44  3.12  3.52  2.99   Free PSA 0.56 ng/dl         % Free PSA 10 %           11/24/08 11/19/08  Hormones  Testosterone, Total 438.0  433.0     PROCEDURES:          Urinalysis - 81003 Dipstick  Color: Straw    ASSESSMENT:      ICD-10 Details   1 GU:   Elevated PSA - R97.20   2   ED due to arterial insufficiency - N52.01   3   Nocturia - R35.1 Stable     PLAN:           Schedule Labs: 6 Weeks - PSA  Return Visit/Planned Activity: 6-8 Weeks - Schedule Surgery          Document Letter(s):  Created for Patient: Clinical Summary         Notes:   The patient's PSA is rising despite resolution of his symptoms. I'm concerned about his rising PSA and have recommended that we proceed with prostate biopsy. The patient had 2 prostate biopsies more than 10 years ago and has visited memories about the discomfort associated with it. He has agreed to do a prostate biopsy, but only under general anesthesia. We will plan to get this done after he returns from his family reunion in August. We'll plan to get a PSA prior.

## 2016-08-31 NOTE — Transfer of Care (Signed)
Immediate Anesthesia Transfer of Care Note  Patient: Steve Brown  Procedure(s) Performed: Procedure(s) (LRB): PROSTATE  BIOPSY TRANSRECTAL ULTRASONIC PROSTATE (TUBP) (N/A)  Patient Location: PACU  Anesthesia Type: General  Level of Consciousness: awake, sedated, patient cooperative and responds to stimulation  Airway & Oxygen Therapy: Patient Spontanous Breathing and Patient connected to RA  Post-op Assessment: Report given to PACU RN, Post -op Vital signs reviewed and stable and Patient moving all extremities  Post vital signs: Reviewed and stable  Complications: No apparent anesthesia complications

## 2016-08-31 NOTE — Anesthesia Procedure Notes (Signed)
Procedure Name: MAC Date/Time: 08/31/2016 7:55 AM Performed by: Justice Rocher Pre-anesthesia Checklist: Patient identified, Timeout performed, Patient being monitored, Suction available and Emergency Drugs available Oxygen Delivery Method: Simple face mask Preoxygenation: Pre-oxygenation with 100% oxygen Induction Type: IV induction Placement Confirmation: breath sounds checked- equal and bilateral and positive ETCO2

## 2016-08-31 NOTE — Anesthesia Preprocedure Evaluation (Addendum)
Anesthesia Evaluation  Patient identified by MRN, date of birth, ID band Patient awake    Reviewed: Allergy & Precautions, NPO status , Patient's Chart, lab work & pertinent test results  History of Anesthesia Complications Negative for: history of anesthetic complications  Airway Mallampati: III  TM Distance: >3 FB Neck ROM: Full    Dental  (+) Dental Advisory Given   Pulmonary sleep apnea and Continuous Positive Airway Pressure Ventilation , PE   breath sounds clear to auscultation       Cardiovascular hypertension, Pt. on medications (-) angina+ CAD and + Cardiac Stents   Rhythm:Regular Rate:Normal  ECG: SB, rate 59  ECHO: - Left ventricle: Normal GLLS at -18.4% The cavity size was normal. There was severe focal basal hypertrophy. Systolic function was normal. The estimated ejection fraction was in the range of 60% to 65%. Wall motion was normal; there were no regional wall motion abnormalities. Features are consistent with a pseudonormal left ventricular filling pattern, with concomitant abnormal relaxation and increased filling pressure (grade 2 diastolic dysfunction). Aortic valve: Trileaflet; normal thickness, mildly calcified leaflets. There was mild regurgitation. Aorta: Aortic root dimension: 39 mm (ED). Aortic root: The aortic root was mildly dilated. Mitral valve: There was mild regurgitation. Atrial septum: There was increased thickness of the septum, consistent with lipomatous hypertrophy. Pulmonary arteries: PA peak pressure: 42 mm Hg (S).  Sees cardiologist '12 cath: patent LAD stent, no significant CAD   Neuro/Psych Anxiety Depression Neuropathy TIACVA (maybe global weakness, walks ok)    GI/Hepatic Neg liver ROS, GERD  Medicated and Controlled,  Endo/Other  diabetes, Insulin Dependent, Oral Hypoglycemic AgentsMorbid obesity  Renal/GU negative Renal ROS     Musculoskeletal   Abdominal (+) + obese,    Peds  Hematology negative hematology ROS (+)   Anesthesia Other Findings Hyperlipidemia  Reproductive/Obstetrics                            Anesthesia Physical  Anesthesia Plan  ASA: III  Anesthesia Plan: MAC   Post-op Pain Management:    Induction: Intravenous  PONV Risk Score and Plan: 1 and Propofol infusion and Treatment may vary due to age or medical condition  Airway Management Planned:   Additional Equipment:   Intra-op Plan:   Post-operative Plan:   Informed Consent: I have reviewed the patients History and Physical, chart, labs and discussed the procedure including the risks, benefits and alternatives for the proposed anesthesia with the patient or authorized representative who has indicated his/her understanding and acceptance.   Dental advisory given  Plan Discussed with: CRNA  Anesthesia Plan Comments:         Anesthesia Quick Evaluation

## 2016-08-31 NOTE — Op Note (Signed)
Prostate Biopsy Procedure  Preoperative diagnosis: Elevated PSA Postoperative diagnosis: Same  Procedure performed: Transrectal ultrasound, periprosthetic local anesthetic block with 2% lidocaine, and prostate biopsy, ultrasound-guided, it is sextant area bilaterally with a total of 12 cores obtained.  Indication: Steve Brown is a 76 year old African-American male with an elevated PSA. He did have a positive culture that was obtained last week and he has been on 3 days of ciprofloxacin twice a day.    Informed consent was obtained after discussing risks/benefits of the procedure.  A time out was performed to ensure correct patient identity.  Pre-Procedure: - Last PSA Level: No results found for: PSA - Ceftriaxone was given preoperatively -Transrectal Ultrasound performed revealing a 38 gm prostate -No significant hypoechoic or median lobe noted  Procedure: - Prostate block performed using 10 cc 1% lidocaine and biopsies taken from sextant areas, a total of 12 under ultrasound guidance.  Post-Procedure: - Patient tolerated the procedure well - He was counseled to seek immediate medical attention if experiences any severe pain, significant bleeding, or fevers - Return in one week to discuss biopsy results  Ardis Hughs, M.D.

## 2016-08-31 NOTE — Discharge Instructions (Signed)
Post Anesthesia Home Care Instructions  Activity: Get plenty of rest for the remainder of the day. A responsible individual must stay with you for 24 hours following the procedure.  For the next 24 hours, DO NOT: -Drive a car -Paediatric nurse -Drink alcoholic beverages -Take any medication unless instructed by your physician -Make any legal decisions or sign important papers.  Meals: Start with liquid foods such as gelatin or soup. Progress to regular foods as tolerated. Avoid greasy, spicy, heavy foods. If nausea and/or vomiting occur, drink only clear liquids until the nausea and/or vomiting subsides. Call your physician if vomiting continues.  Special Instructions/Symptoms: Your throat may feel dry or sore from the anesthesia or the breathing tube placed in your throat during surgery. If this causes discomfort, gargle with warm salt water. The discomfort should disappear within 24 hours.  If you had a scopolamine patch placed behind your ear for the management of post- operative nausea and/or vomiting:  1. The medication in the patch is effective for 72 hours, after which it should be removed.  Wrap patch in a tissue and discard in the trash. Wash hands thoroughly with soap and water. 2. You may remove the patch earlier than 72 hours if you experience unpleasant side effects which may include dry mouth, dizziness or visual disturbances. 3. Avoid touching the patch. Wash your hands with soap and water after contact with the patch.   Resume all home medications Complete antibiotics as prescribed You should expect to have blood in your urine and stool for approximately 2 days, however this can linger for up to two weeks. You may notice blood in your ejaculate as well which then may develop into a rust color for up to 6 weeks. Recommend taking ibuprofen 400 mg 3 times daily times 5 days. This will help reduce inflammation as well as pain. Resume Xarelto in 72 hours or once bleeding has  stopped.  Transrectal Ultrasound-Guided Biopsy A transrectal ultrasound-guided biopsy is a procedure to remove samples of tissue from your prostate using ultrasound images to guide the procedure. The procedure is usually done to evaluate the prostate gland of men who have an elevated prostate-specific antigen (PSA). PSA is a blood test to screen for prostate cancer. The biopsy samples are taken to check for prostate cancer. Tell a health care provider about:  Any allergies you have.  All medicines you are taking, including vitamins, herbs, eye drops, creams, and over-the-counter medicines.  Any problems you or family members have had with anesthetic medicines.  Any blood disorders you have.  Any surgeries you have had.  Any medical conditions you have. What are the risks? Generally, this is a safe procedure. However, as with any procedure, problems can occur. Possible problems include:  Infection of your prostate.  Bleeding from your rectum or blood in your urine.  Difficulty urinating.  Nerve damage (this is usually temporary).  Damage to surrounding structures such as blood vessels, organs, and muscles, which would require other procedures.  What happens before the procedure?  Do not eat or drink anything after midnight on the night before the procedure or as directed by your health care provider.  Take medicines only as directed by your health care provider.  Your health care provider may have you stop taking certain medicines 5-7 days before the procedure.  You will be given an enema before the procedure. During an enema, a liquid is injected into your rectum to clear out waste.  You may have lab tests  the day of your procedure.  Plan to have someone take you home after the procedure. What happens during the procedure?  You will be given medicine to help you relax (sedative) before the procedure. An IV tube will be inserted into one of your veins and used to give  fluids and medicine.  You will be given antibiotic medicine to reduce the risk of an infection.  You will be placed on your side for the procedure.  A probe with lubricated gel will be placed into your rectum, and images will be taken of your prostate and surrounding structures.  Numbing medicine will be injected into the prostate before the biopsy samples are taken.  A biopsy needle will then be inserted and guided to your prostate with the use of the ultrasound images.  Samples of prostate tissue will be taken, and the needle will then be removed.  The biopsy samples will be sent to a lab to be analyzed. Results are usually back in 2-3 days. What happens after the procedure?  You will be taken to a recovery area where you will be monitored.  You may have some discomfort in the rectal area. You will be given pain medicines to control this.  You may be allowed to go home the same day, or you may need to stay in the hospital overnight. This information is not intended to replace advice given to you by your health care provider. Make sure you discuss any questions you have with your health care provider. Document Released: 05/12/2013 Document Revised: 06/03/2015 Document Reviewed: 08/14/2012 Elsevier Interactive Patient Education  [employer].

## 2016-08-31 NOTE — Anesthesia Postprocedure Evaluation (Signed)
Anesthesia Post Note  Patient: Steve Brown  Procedure(s) Performed: Procedure(s) (LRB): PROSTATE  BIOPSY TRANSRECTAL ULTRASONIC PROSTATE (TUBP) (N/A)     Patient location during evaluation: PACU Anesthesia Type: MAC Level of consciousness: awake and alert Pain management: pain level controlled Vital Signs Assessment: post-procedure vital signs reviewed and stable Respiratory status: spontaneous breathing, nonlabored ventilation, respiratory function stable and patient connected to nasal cannula oxygen Cardiovascular status: stable and blood pressure returned to baseline Anesthetic complications: no    Last Vitals:  Vitals:   08/31/16 1000 08/31/16 1012  BP: (!) 172/77 (!) 166/79  Pulse: (!) 50 (!) 50  Resp: 16 16  Temp: 36.6 C   SpO2:      Last Pain:  Vitals:   08/31/16 0620  TempSrc:   PainSc: 8                  Keeara Frees P B[place]

## 2016-09-01 ENCOUNTER — Encounter (HOSPITAL_BASED_OUTPATIENT_CLINIC_OR_DEPARTMENT_OTHER): Payer: Self-pay | Admitting: Urology

## 2016-09-01 DIAGNOSIS — N400 Enlarged prostate without lower urinary tract symptoms: Secondary | ICD-10-CM | POA: Diagnosis not present

## 2016-09-01 DIAGNOSIS — R350 Frequency of micturition: Secondary | ICD-10-CM | POA: Diagnosis not present

## 2016-09-04 DIAGNOSIS — Z7901 Long term (current) use of anticoagulants: Secondary | ICD-10-CM | POA: Diagnosis not present

## 2016-09-04 DIAGNOSIS — H532 Diplopia: Secondary | ICD-10-CM | POA: Diagnosis not present

## 2016-09-04 DIAGNOSIS — I679 Cerebrovascular disease, unspecified: Secondary | ICD-10-CM | POA: Diagnosis not present

## 2016-09-05 DIAGNOSIS — Z8673 Personal history of transient ischemic attack (TIA), and cerebral infarction without residual deficits: Secondary | ICD-10-CM | POA: Diagnosis not present

## 2016-09-05 DIAGNOSIS — H532 Diplopia: Secondary | ICD-10-CM | POA: Diagnosis not present

## 2016-09-05 DIAGNOSIS — G9389 Other specified disorders of brain: Secondary | ICD-10-CM | POA: Diagnosis not present

## 2016-09-14 DIAGNOSIS — Z8546 Personal history of malignant neoplasm of prostate: Secondary | ICD-10-CM | POA: Diagnosis not present

## 2016-09-15 ENCOUNTER — Encounter: Payer: Self-pay | Admitting: Radiation Oncology

## 2016-09-20 DIAGNOSIS — J329 Chronic sinusitis, unspecified: Secondary | ICD-10-CM | POA: Diagnosis not present

## 2016-09-20 DIAGNOSIS — R930 Abnormal findings on diagnostic imaging of skull and head, not elsewhere classified: Secondary | ICD-10-CM | POA: Diagnosis not present

## 2016-09-20 DIAGNOSIS — E782 Mixed hyperlipidemia: Secondary | ICD-10-CM | POA: Diagnosis not present

## 2016-09-25 ENCOUNTER — Encounter: Payer: Self-pay | Admitting: Radiation Oncology

## 2016-09-25 ENCOUNTER — Ambulatory Visit
Admission: RE | Admit: 2016-09-25 | Discharge: 2016-09-25 | Disposition: A | Discharge status: Home / Self Care | Payer: Medicare Other | Source: Ambulatory Visit | Attending: Radiation Oncology | Admitting: Radiation Oncology

## 2016-09-25 VITALS — BP 115/65 | HR 64 | Temp 98.0°F | Resp 16 | Ht 65.0 in | Wt 188.2 lb

## 2016-09-25 DIAGNOSIS — Z9049 Acquired absence of other specified parts of digestive tract: Secondary | ICD-10-CM | POA: Diagnosis not present

## 2016-09-25 DIAGNOSIS — Z7984 Long term (current) use of oral hypoglycemic drugs: Secondary | ICD-10-CM | POA: Diagnosis not present

## 2016-09-25 DIAGNOSIS — J3089 Other allergic rhinitis: Secondary | ICD-10-CM | POA: Diagnosis not present

## 2016-09-25 DIAGNOSIS — R972 Elevated prostate specific antigen [PSA]: Secondary | ICD-10-CM | POA: Diagnosis not present

## 2016-09-25 DIAGNOSIS — N182 Chronic kidney disease, stage 2 (mild): Secondary | ICD-10-CM | POA: Diagnosis not present

## 2016-09-25 DIAGNOSIS — C61 Malignant neoplasm of prostate: Secondary | ICD-10-CM | POA: Diagnosis not present

## 2016-09-25 DIAGNOSIS — Z510 Encounter for antineoplastic radiation therapy: Secondary | ICD-10-CM | POA: Diagnosis not present

## 2016-09-25 DIAGNOSIS — Z8042 Family history of malignant neoplasm of prostate: Secondary | ICD-10-CM | POA: Diagnosis not present

## 2016-09-25 DIAGNOSIS — I129 Hypertensive chronic kidney disease with stage 1 through stage 4 chronic kidney disease, or unspecified chronic kidney disease: Secondary | ICD-10-CM | POA: Diagnosis not present

## 2016-09-25 DIAGNOSIS — Z7982 Long term (current) use of aspirin: Secondary | ICD-10-CM | POA: Diagnosis not present

## 2016-09-25 DIAGNOSIS — E1122 Type 2 diabetes mellitus with diabetic chronic kidney disease: Secondary | ICD-10-CM | POA: Diagnosis not present

## 2016-09-25 DIAGNOSIS — Z9889 Other specified postprocedural states: Secondary | ICD-10-CM | POA: Diagnosis not present

## 2016-09-25 DIAGNOSIS — J329 Chronic sinusitis, unspecified: Secondary | ICD-10-CM | POA: Diagnosis not present

## 2016-09-25 DIAGNOSIS — Z79899 Other long term (current) drug therapy: Secondary | ICD-10-CM | POA: Insufficient documentation

## 2016-09-25 DIAGNOSIS — R3129 Other microscopic hematuria: Secondary | ICD-10-CM | POA: Diagnosis not present

## 2016-09-25 DIAGNOSIS — R42 Dizziness and giddiness: Secondary | ICD-10-CM | POA: Diagnosis not present

## 2016-09-25 HISTORY — DX: Malignant neoplasm of prostate: C61

## 2016-09-25 HISTORY — DX: Abnormal histological findings in specimens from other organs, systems and tissues: R89.7

## 2016-09-25 NOTE — Progress Notes (Signed)
See progress note under physician encounter. 

## 2016-09-25 NOTE — Progress Notes (Signed)
Radiation Oncology         [phone] ________________________________  Initial Outpatient Consultation  Name: Steve Brown MRN: [id number]  Date: 09/25/2016  DOB: [birth year]  NT:IRWE, Dwyane Luo, MD  Ardis Hughs, MD   REFERRING PHYSICIAN: Ardis Hughs, MD  DIAGNOSIS: 76 y.o. gentleman with Stage T1c adenocarcinoma of the prostate with Gleason Score of 3+4, and PSA of 5.57    ICD-10-CM   1. Malignant neoplasm of prostate ([place]) C61     HISTORY OF PRESENT ILLNESS: Steve Brown is a 76 y.o. male with a diagnosis of prostate cancer. Prior to his diagnosis, he had two negative biopsies of the prostate in 2004 and 2005, and a negative prostate MRI in 2015. His PSA continued to rise and measured 4.37 in July 2017. More recently he was noted to have microscopic hematuria by his primary care physician, Dr. Melinda Crutch.  Accordingly, he was referred for evaluation in urology by Dr. Louis Meckel on 05/09/2016 and diagnosed with prostatitis. This was treated with one month of Bactrim in combination with Tamsulosin 0.4 mg. A digital rectal examination was performed at that time revealing no nodules. Once his prostatitis had resolved, his PSA was rechecked on 06/12/2016 and was elevated at 5.57. The patient proceeded to transrectal ultrasound with 12 biopsies of the prostate on 08/31/2016 under general anesthesia.  The prostate volume measured 38 cc.  Out of 12 core biopsies, 5 were positive.  The maximum Gleason score was 3+4, and this was seen in right mid lateral, right apex lateral, right mid medial, right apex medial, and left mid lateral.    The patient reviewed the biopsy results with his urologist and he has kindly been referred today for discussion of potential radiation treatment options. He has a family history of prostate cancer in his father.    PREVIOUS RADIATION THERAPY: No  PAST MEDICAL HISTORY:  Past Medical History:  Diagnosis Date  . Abnormal prostate biopsy     . Anticoagulant long-term use    currently xarelto  . BPH with elevated PSA   . CKD (chronic kidney disease), stage II   . Coronary artery disease    CARDIOLOGIST-  DR Irish Lack--  2010-- PCI w/ stenting midLAD  . DDD (degenerative disc disease), lumbar   . Degeneration of cervical intervertebral disc   . Depression   . Dyspnea on exertion   . GERD (gastroesophageal reflux disease)   . Hemiparesis due to cerebral infarction ([place])   . History of cerebrovascular accident (CVA) with residual deficit 2002 and 2003--  hemiparisis both sides   per MRI  anterior left frontal lobe, left para midline pons, and inferior cerebullam bilaterally infarcts  . History of pulmonary embolus (PE)    06-30-2012  extensive bilaterally  . History of recurrent TIAs   . History of syncope    hx multiple pre-syncope and syncopal episodes due to vasovagal, orthostatic hypotension, dehydration  . History of TIAs   . Hyperlipidemia   . Hypertension   . Mild atherosclerosis of carotid artery, bilateral    per last duplex 11-04-2014  bilateral ICA 1--39%  . Neuropathy   . OSA on CPAP    followed by dr dohmeier--  sev. osa w/ AHI 65.9  . Prostate cancer ([place])   . S/P coronary artery stent placement 2010   stenting to mid LAD  . Simple renal cyst    bilaterally  . Trigger finger of both hands 11-17-13  . Type 2 diabetes mellitus ([place]) dx 1986  last one A1c 9.2 on 04-26-2016  . Unsteady gait    . Hx prior CVA/TIAs;  . Vertebral artery occlusion, left    chronic      PAST SURGICAL HISTORY: Past Surgical History:  Procedure Laterality Date  . ANTERIOR CERVICAL DECOMP/DISCECTOMY FUSION  2004   C3 -- C6  . CARDIAC CATHETERIZATION  06-10-2010   dr Irish Lack   wide patent LAD stent, mid lesion at the origin of the septal prior to the previous stent 40-50%/  normal LVF, ef 55%  . CARDIOVASCULAR STRESS TEST  10-23-2012  dr Irish Lack   normal nuclear perfusion study w/ no ischemia/  normal LV function and wall  motion , ef 65%  . CARPAL TUNNEL RELEASE Bilateral   . CATARACT EXTRACTION W/ INTRAOCULAR LENS  IMPLANT, BILATERAL    . CHOLECYSTECTOMY N/A 11/02/2015   Procedure: LAPAROSCOPIC CHOLECYSTECTOMY WITH INTRAOPERATIVE CHOLANGIOGRAM;  Surgeon: Donnie Mesa, MD;  Location: [facility];  Service: General;  Laterality: N/A;  . COLONOSCOPY    . CORONARY ANGIOPLASTY WITH STENT PLACEMENT  02/2008   stenting to mid LAD  . LEFT HEART CATHETERIZATION WITH CORONARY ANGIOGRAM N/A 04/03/2013   Procedure: LEFT HEART CATHETERIZATION WITH CORONARY ANGIOGRAM;  Surgeon: Jettie Booze, MD;  Location: [facility] CATH LAB;  Service: Cardiovascular;  Laterality: N/A;  patent mLAD stent  w/ mild disease in remainder LAD and its branches;  mod. focal lesion midLCFx- FFR of lesion was negative for ischemia/  normal LVSF, ef 50%  . lungs     "fluid pumped off lungs"  . NEUROPLASTY / TRANSPOSITION ULNAR NERVE AT ELBOW Right   . PROSTATE BIOPSY N/A 08/31/2016   Procedure: PROSTATE  BIOPSY TRANSRECTAL ULTRASONIC PROSTATE (TUBP);  Surgeon: Ardis Hughs, MD;  Location: [facility];  Service: Urology;  Laterality: N/A;  . TRANSTHORACIC ECHOCARDIOGRAM  04/27/2016   severe focal basal LVH, ef 60-65%,  grade 2 diastoilc dysfunction/  mild AR, MR, and TR/  atrial septum lipomatous hypertrophy/  PASP 38mmHg  . UMBILICAL HERNIA REPAIR      FAMILY HISTORY:  Family History  Problem Relation Age of Onset  . Aneurysm Mother   . Cancer Father        unknown either pancreatic or prostate  . Heart attack Neg Hx     SOCIAL HISTORY:  Social History   Social History  . Marital status: Divorced    Spouse name: N/A  . Number of children: 4  . Years of education: 12   Occupational History  .  Retired    retired   Social History Main Topics  . Smoking status: Never Smoker  . Smokeless tobacco: Never Used  . Alcohol use No  . Drug use: No  . Sexual activity: Yes   Other Topics Concern  . Not on file   Social  History Narrative   Patient lives at home alone and he is single.  Patient is retired.    Caffeine - one cups daily.   Right handed.   Patient has a high school education.   Patient has four adult children.    ALLERGIES: Other and Phenergan [promethazine]  MEDICATIONS:  Current Outpatient Prescriptions  Medication Sig Dispense Refill  . Ascorbic Acid (VITAMIN C PO) Take 1 tablet by mouth daily.    Marland Kitchen aspirin EC 81 MG tablet Take 1 tablet (81 mg total) by mouth daily.    . BD PEN NEEDLE NANO U/F 32G X 4 MM MISC by Does not apply route.     Marland Kitchen  bisacodyl (DULCOLAX) 5 MG EC tablet Take 5 mg by mouth daily as needed for moderate constipation.    . cholecalciferol (VITAMIN D) 1000 UNITS tablet Take 1,000 Units by mouth daily.    Marland Kitchen dutasteride (AVODART) 0.5 MG capsule Take 0.5 mg by mouth daily.    . furosemide (LASIX) 20 MG tablet Take 20 mg by mouth daily as needed (swelling).     Marland Kitchen HUMALOG MIX 75/25 KWIKPEN (75-25) 100 UNIT/ML Kwikpen Inject 18-20 Units into the skin 2 (two) times daily. 18 units in evening, and 20 units in the morning.  1  . memantine (NAMENDA) 10 MG tablet Take 1 tablet (10 mg total) by mouth 2 (two) times daily. 180 tablet 1  . metFORMIN (GLUCOPHAGE-XR) 500 MG 24 hr tablet Take 500 mg by mouth 2 (two) times daily.    . nitroGLYCERIN (NITROSTAT) 0.4 MG SL tablet Place 1 tablet (0.4 mg total) under the tongue every 5 (five) minutes as needed for chest pain. 25 tablet 12  . pantoprazole (PROTONIX) 40 MG tablet Take 40 mg by mouth daily.    . pregabalin (LYRICA) 300 MG capsule Take 1 capsule (300 mg total) by mouth 2 (two) times daily. 180 capsule 3  . ramipril (ALTACE) 2.5 MG capsule Take 2.5 mg by mouth daily.  1  . repaglinide (PRANDIN) 1 MG tablet Take 1 mg by mouth daily as needed.    . rivaroxaban (XARELTO) 20 MG TABS tablet Take 1 tablet (20 mg total) by mouth daily. Resume in 72 hours after procedure. 30 tablet   . rosuvastatin (CRESTOR) 10 MG tablet Take 10 mg by mouth  every evening.     . sulfamethoxazole-trimethoprim (BACTRIM DS,SEPTRA DS) 800-160 MG tablet Take 1 tablet by mouth 2 (two) times daily.    . tamsulosin (FLOMAX) 0.4 MG CAPS capsule Take 0.4 mg by mouth daily.    . valACYclovir (VALTREX) 500 MG tablet Take 500 mg by mouth daily.     No current facility-administered medications for this encounter.     REVIEW OF SYSTEMS:  On review of systems, the patient reports that he is doing well overall. He denies any chest pain, shortness of breath, cough, fevers, chills, night sweats, or unintended weight changes. He denies any bowel disturbances, and denies abdominal pain, nausea or vomiting. He reports generalized chronic pain related to his arthritis, mostly in the upper left extremity, that has gradually increased since his neck surgery in 2004. His IPSS was 21, indicating severe urinary symptoms with persistent hematuria. He denies dysuria, leakage or incontinence. He is rarely able to complete sexual activity with all attempts. A complete review of systems is obtained and is otherwise negative.    PHYSICAL EXAM:  Wt Readings from Last 3 Encounters:  09/25/16 188 lb 3.2 oz (85.4 kg)  08/31/16 185 lb (83.9 kg)  08/03/16 192 lb (87.1 kg)   Temp Readings from Last 3 Encounters:  09/25/16 98 F (36.7 C) (Oral)  08/31/16 97.9 F (36.6 C)  05/19/16 97.7 F (36.5 C) (Oral)   BP Readings from Last 3 Encounters:  09/25/16 115/65  08/31/16 (!) 166/79  08/03/16 (!) 93/50   Pulse Readings from Last 3 Encounters:  09/25/16 64  08/31/16 (!) 50  08/03/16 66   Pain Assessment Pain Score:  (generalized pain)/10  In general this is a well appearing African-American man in no acute distress. He is alert and oriented x4 and appropriate throughout the examination. HEENT reveals that the patient is normocephalic, atraumatic. EOMs are  intact. PERRLA. Skin is intact without any evidence of gross lesions. Cardiopulmonary assessment is negative for acute  distress and he exhibits normal effort.   KPS = 100  100 - Normal; no complaints; no evidence of disease. 90   - Able to carry on normal activity; minor signs or symptoms of disease. 80   - Normal activity with effort; some signs or symptoms of disease. 29   - Cares for self; unable to carry on normal activity or to do active work. 60   - Requires occasional assistance, but is able to care for most of his personal needs. 50   - Requires considerable assistance and frequent medical care. 27   - Disabled; requires special care and assistance. 41   - Severely disabled; hospital admission is indicated although death not imminent. 4   - Very sick; hospital admission necessary; active supportive treatment necessary. 10   - Moribund; fatal processes progressing rapidly. 0     - Dead  Karnofsky DA, Abelmann [place], Craver LS and Burchenal JH [phone]) The use of the nitrogen mustards in the palliative treatment of carcinoma: with particular reference to bronchogenic carcinoma Cancer 1 634-56  LABORATORY DATA:  Lab Results  Component Value Date   WBC 4.3 05/19/2016   HGB 12.6 (L) 08/31/2016   HCT 37.0 (L) 08/31/2016   MCV 91.3 05/19/2016   PLT 173 05/19/2016   Lab Results  Component Value Date   NA 138 08/31/2016   K 4.2 08/31/2016   CL 106 05/19/2016   CO2 26 05/19/2016   Lab Results  Component Value Date   ALT 22 05/18/2016   AST 26 05/18/2016   ALKPHOS 72 05/18/2016   BILITOT 0.5 05/18/2016     RADIOGRAPHY: [place] Intraoperative  Result Date: 08/31/2016 CLINICAL DATA:  Ultrasound was provided for use by the ordering physician, and a technical charge was applied by the performing facility.  No radiologist interpretation/professional services rendered.     IMPRESSION/PLAN: 1. 76 y.o. gentleman with Stage T1c adenocarcinoma of the prostate with Gleason Score of 3+4, and PSA of 5.57. Today Dr. Tammi Klippel reviewed the findings and workup thus far.  We discussed the natural history of prostate  cancer.  We reviewed the the implications of T-stage, Gleason's Score, and PSA on decision-making and outcomes in prostate cancer.  We discussed radiation treatment in the management of prostate cancer with regard to the logistics and delivery of external beam radiation treatment as well as the logistics and delivery of prostate brachytherapy.  We compared and contrasted each of these approaches and given his urinary scores and need for tamsulosin, we would anticipate moving forward with external beam radiotherapy rather than brachytherapy. The patient expressed interest in external beam radiotherapy. We will share this with Dr. Louis Meckel and Dr. Tammi Klippel discussed fiducial marker placement as well as options for spaceOAR and fiducial markers under general anesthesia. We will notify our scheduling department to move forward with scheduling his fiducials and spaceOAR in the OR with Dr. Louis Meckel.    Carola Rhine, PAC Seen with ------------------------------------------------   Tyler Pita, MD [facility] Medical Director and Director of Stereotactic Radiosurgery Direct Dial: [phone]  Fax: [phone] [email or web address]  Skype  [email or web address]  This document serves as a record of services personally performed by Tyler Pita, MD and Shona Simpson, PA-C. It was created on their behalf by Rae Lips, a trained medical scribe. The creation of this record is based on the scribe's personal observations and the providers'  statements to them. This document has been checked and approved by the attending providers.

## 2016-09-25 NOTE — Progress Notes (Signed)
GU Location of Tumor / Histology: prostatic adenocarcinoma  If Prostate Cancer, Gleason Score is (3 + 4) and PSA is (5.57). Prostate volume: 38 grams.  Constantin Hillery was referred by PCP, Melinda Crutch, to Dr. Louis Meckel for further evaluation of microscopic hematuria in May 2018.   Biopsies of prostate (if applicable) revealed:      Past/Anticipated interventions by urology, if any: cystoscopy, prostate biopsy, referral to radiation oncology.  Past/Anticipated interventions by medical oncology, if any: no  Weight changes, if any: No  Bowel/Bladder complaints, if any: IPSS 21. Reports hematuria continues. Denies dysuria, leakage or incontinence.    Nausea/Vomiting, if any: no  Pain issues, if any:  Reports generalized chronic pain related to arthritis mostly in left upper extremity. Reports pain has gradually increased since neck surgery in 2004.  SAFETY ISSUES:  Prior radiation? no  Pacemaker/ICD? no  Possible current pregnancy? no  Is the patient on methotrexate? no  Current Complaints / other details:  76 year old male. Retired.

## 2016-09-26 DIAGNOSIS — E1165 Type 2 diabetes mellitus with hyperglycemia: Secondary | ICD-10-CM | POA: Diagnosis not present

## 2016-09-28 ENCOUNTER — Ambulatory Visit: Admission: RE | Admit: 2016-09-28 | Payer: Medicare Other | Source: Ambulatory Visit | Admitting: Radiation Oncology

## 2016-09-28 ENCOUNTER — Encounter: Payer: Self-pay | Admitting: Neurology

## 2016-09-28 ENCOUNTER — Ambulatory Visit (INDEPENDENT_AMBULATORY_CARE_PROVIDER_SITE_OTHER): Payer: Medicare Other | Admitting: Neurology

## 2016-09-28 ENCOUNTER — Other Ambulatory Visit: Payer: Self-pay | Admitting: Urology

## 2016-09-28 ENCOUNTER — Telehealth: Payer: Self-pay | Admitting: Radiation Oncology

## 2016-09-28 VITALS — BP 118/62 | HR 61 | Ht 65.0 in | Wt 189.0 lb

## 2016-09-28 DIAGNOSIS — Z8673 Personal history of transient ischemic attack (TIA), and cerebral infarction without residual deficits: Secondary | ICD-10-CM | POA: Insufficient documentation

## 2016-09-28 DIAGNOSIS — F0150 Vascular dementia without behavioral disturbance: Secondary | ICD-10-CM | POA: Diagnosis not present

## 2016-09-28 DIAGNOSIS — I2510 Atherosclerotic heart disease of native coronary artery without angina pectoris: Secondary | ICD-10-CM

## 2016-09-28 DIAGNOSIS — C61 Malignant neoplasm of prostate: Secondary | ICD-10-CM

## 2016-09-28 NOTE — Progress Notes (Signed)
PATIENT: Steve Brown DOB: August 03, 1940  REASON FOR VISIT:  Interval history from 09/28/2016. Steve Brown had recently seen his primary care physician, Dr. Harle Battiest, who ordered an MRI of the brain, this time the MRI was performed at [facility] sound, his previous study from 10/ 2016 have been performed at [facility] and was interpreted by Dr. Parke Simmers. The study was interpreted by Dr. Marinus Maw. Reading the reports it seems very similar. The new about a bilateral cerebellar hemispheres, left paramedian pons, the new about chronic sinusitis and diffuse white matter disease. Dr. Marinus Maw commented on a small area of encephalomalacia that he found slightly atypical in the left frontal lobe.  I was able to review both MRI side-by-side in the presence of the patient. I do not see a difference. I reviewed T2-weighted images and axial FLAIR.     I have the nature of seeing Steve Brown today on 08/03/2016, he has achieved 80% compliance on his CPAP use was 5 hours and 4 minutes of daily use on average, his residual AHI is 1.3. He is using CPAP at a set pressure of 13 cm water with 3 cm EPR. He had become non compliant - and needed a new study for  Supplies and resetting of his CPAP>  He had undergone a new split-night titration study on 03/27/2016 also he had been followed in our sleep clinic since 2013. He has a history of multi-infarct cerebrovascular disease, hypertension, diabetes, and has been followed for CPAP compliance. His baseline AHI was confirmed at 60.1 per hour AHI, he had prolonged hypoxemia saturation below 89% was 79 minutes. He improved drastically with CPAP saturation equaled only 8 minutes, he had no PLMS, his AHI was reduced to 4.5. Sleep efficiency with CPAP was 98%! He still has nocturia 3-4 times, and he sleeps longer than he uses CPAP. Feels less fatigued.      This patient has been established as a sleep patient in our clinic since 2013, has a history of multi-infarct, hypertension,  diabetes and was evaluated for neuropathy in 2015, was followed for CPAP compliance until last May when he was no longer compliant due to repeated sinus infections, hospitalizations etc. He stated today that he has not been able to reinitiate CPAP use since March 2017.   his primary care physician, Dr. Harrington Challenger, referred now for memory concerns, in a patient with known multi-infarct history but with a sliding result on his minimal mental status examinations. A note from January 5 of this year states that the patient had scored 26 out of 29 points in a high school educated African-American gentleman age 76. The patient had suffered multiple strokes and ongoing TIA events, image studies were quoted below. I used today a Montral cognitive assessment and he scored 24 out of 30 points 2 points less  Expected. The details of the exam results are copied to the physical exam note. He did particularly well with the trail making test but had difficulties with clock drawing and copying an image. His short-term memory was excellent 5 out of 5 words, he had trouble with serial 7- but he had a lifelong difficulties with mathematics all through HS.  Steve Brown scored 24 out of 30 points and a Montral cognitive assessment, placing him at the mild cognitive impairment. He does have some amnestic events, describes how he cannot remember as a sermon after church visit, he does not have orientation difficulties such as date time place. And remarkably he will could recall 5  out of 5 words. This is not typical for short-term memory loss either. He did have visual spatial difficulties with clock drawing and copying a cube. He is very selective and partial difficulties may well be related to his previous strokes. He also is chronically anticoagulated to prevent strokes I did think we need to repeat an imaging study of the brain - I suspected that this is a vascular or infarct related memory loss rather than Alzheimer's disease.   It  was after review of his notes from Dr Caroline Sauger at [facility] that I noticed that he had just been reevaluated for new stroke at the end of last year including imaging studies. I do not need to repeat any. His cognitive deficits are  explained with a multi-infarct dementia.     11-18-2015 CD  Steve Brown is a 76 year old male with a history of obstructive sleep apnea on CPAP. He returns today for follow-up. His last download from May 2017i ndicated that he uses machine 27 out of 30 days for compliance of 90%. He stopped using his CPAP at the end of March and again in June 2017, had felt sick for while, finally underwent gallbladder surgery after month of abdominal pain. The gallbladder inflammation did cause some sleep interruption and some discomfort at night, He was tossing and turning and also rubbing alcohol on his belly to relieve the symptoms. Now since the surgery he has regained the ability to sleep more soundly and is wanting to return to CPAP use. His daughter suffered a stroke earlier this month, he is worried. For the months of June he has a compliance of 22 out of 30 days 75% for 6 hours and 13 minutes on average use the 9 cm setting with an AHI of 0.9. He also reports that he has a hard time sleeping on his back. Therefore when he sleeps on his sides occasionally the pillow will push the mask off his face causing a leak.  He reports that he does try to exercise daily. He has continued on Xarelto and aspirin for stroke prevention. His primary care is managing his hypertension and hyperlipidemia. He denies any additional strokelike symptoms.   UPDATE 05/18/14 Steve Brown, who was diagnosed with severe sleep apnea at an AHI of 65.9, has made efforts to increase his CPAP compliance. CPAP was initiated to reduce his secondary CVA risk.  He has used the machine over the last 30 days still only on 18 days, and all these days over 4 hours his compliance is 53% his average user time is 6 hours and 18  minutes his EPR is 1 cm and a set pressure 9 cm water unchanged from the last year. His residual AHI was 0.9 documenting that the setting is effective. Fatigue severity score was only 18 and sleepiness score 6 points again I at contribute to the lower sleepiness score also to his use of CPAP. He stated that he had some sinusitis problems that kept him from using CPAP and has just seen Dr. Ernesto Rutherford who prescribed no antibiotics after other measures had failed. He is reluctant to use an anti-decongestant which I would also not recommend because it can spike his blood pressure. He has Flonase which Dr. Harrington Challenger his primary care physician has prescribed for him. I will see him again in one year for CPAP compliance visit but urged him to try to use the machine every night or at least if he has a medical reason not to use it to discontinue  the use for 3 or 4 days until he recovers and then start again. He has also gained some weight which is a risk factor for obstructive sleep apnea alongside his retrognathia. He is now 185 pounds.   HISTORY HISTORY 08/14/13 (CD): Mr. Neisen is meanwhile 76 years old and has been staying active. He has controlled his weight he is physically active, he also has undergone a complete course of physical therapy since last visit. After out last visit, we had obtained an MRI of the brain without contrast and compared to the study from earlier the same year : there was no change in comparison to the May CT on this MRI dated 07-04-13 . The patient has few scattered small vessel injuries and some lacunar infarcts.  The main risk factor for these as hypertension and diabetes- Occupational therapy saw him for left arm pain. Additional risk factor is the patient's diabetes mellitus condition. I referred him for occupational therapy to get further insight in what may have caused his arm pain.  He noted that he could left pounds with the left 3 pounds with her right hand now there is still a  significant difference .  resulting in the conduction study and EMG study which was then performed on 07-09-13, normal study no evidence of large fiber neuropathy in the left upper extremity deltoid, biceps, triceps and flexor carpi radialis as well as interosseus Muscles; no abnormal EMG activity.  He underwent a sleep study at [facility] [facility] : results from 08-07-13 reviewed today :  Mr. Strohmeier was diagnosed with severe sleep apnea at an AHI of 65.9. Lowest point of oxygen saturation was 70%. He was titrated to CPAP at 9 cm water he slept 92.7 minutes at that pressure of which 23 minutes of REM sleep. The AHI was now 0.6 per hour. The previously very fragmented sleep architecture became now essentially normal. The technologist used a PIC all equal nasal mask. The patient is asked to return in about 50 days for a followup with the CPAP machine.  I also reviewed his long medication list : patient has been on Xeralto for chronic anticoagulation.  REVIEW OF SYSTEMS: Out of a complete 14 system review of symptoms, the patient complains only of the following symptoms, and all other reviewed systems are negative.  "i cannot recall the Sunday sermon. Non compliant with CPAP- 11 month on and off.   ALLERGIES: Allergies  Allergen Reactions  . Other Other (See Comments)    Per patient- cardiac cath dye-  "woke up during procedure hysterical."  . Phenergan [Promethazine] Other (See Comments)    Mood changes     HOME MEDICATIONS: Outpatient Medications Prior to Visit  Medication Sig Dispense Refill  . Ascorbic Acid (VITAMIN C PO) Take 1 tablet by mouth daily.    Marland Kitchen aspirin EC 81 MG tablet Take 1 tablet (81 mg total) by mouth daily.    . BD PEN NEEDLE NANO U/F 32G X 4 MM MISC by Does not apply route.     . bisacodyl (DULCOLAX) 5 MG EC tablet Take 5 mg by mouth daily as needed for moderate constipation.    . cholecalciferol (VITAMIN D) 1000 UNITS tablet Take 1,000 Units by mouth daily.    Marland Kitchen dutasteride  (AVODART) 0.5 MG capsule Take 0.5 mg by mouth daily.    . furosemide (LASIX) 20 MG tablet Take 20 mg by mouth daily as needed (swelling).     Marland Kitchen HUMALOG MIX 75/25 KWIKPEN (75-25) 100 UNIT/ML [employer]  Inject 18-20 Units into the skin 2 (two) times daily. 18 units in evening, and 20 units in the morning.  1  . memantine (NAMENDA) 10 MG tablet Take 1 tablet (10 mg total) by mouth 2 (two) times daily. 180 tablet 1  . metFORMIN (GLUCOPHAGE-XR) 500 MG 24 hr tablet Take 500 mg by mouth 2 (two) times daily.    . nitroGLYCERIN (NITROSTAT) 0.4 MG SL tablet Place 1 tablet (0.4 mg total) under the tongue every 5 (five) minutes as needed for chest pain. 25 tablet 12  . pantoprazole (PROTONIX) 40 MG tablet Take 40 mg by mouth daily.    . pregabalin (LYRICA) 300 MG capsule Take 1 capsule (300 mg total) by mouth 2 (two) times daily. 180 capsule 3  . ramipril (ALTACE) 2.5 MG capsule Take 2.5 mg by mouth daily.  1  . repaglinide (PRANDIN) 1 MG tablet Take 1 mg by mouth daily as needed.    . rivaroxaban (XARELTO) 20 MG TABS tablet Take 1 tablet (20 mg total) by mouth daily. Resume in 72 hours after procedure. 30 tablet   . rosuvastatin (CRESTOR) 10 MG tablet Take 10 mg by mouth every evening.     . sulfamethoxazole-trimethoprim (BACTRIM DS,SEPTRA DS) 800-160 MG tablet Take 1 tablet by mouth 2 (two) times daily.    . tamsulosin (FLOMAX) 0.4 MG CAPS capsule Take 0.4 mg by mouth daily.    . valACYclovir (VALTREX) 500 MG tablet Take 500 mg by mouth daily.     No facility-administered medications prior to visit.     PAST MEDICAL HISTORY: Past Medical History:  Diagnosis Date  . Abnormal prostate biopsy   . Anticoagulant long-term use    currently xarelto  . BPH with elevated PSA   . CKD (chronic kidney disease), stage II   . Coronary artery disease    CARDIOLOGIST-  DR Irish Lack--  2010-- PCI w/ stenting midLAD  . DDD (degenerative disc disease), lumbar   . Degeneration of cervical intervertebral disc   .  Depression   . Dyspnea on exertion   . GERD (gastroesophageal reflux disease)   . Hemiparesis due to cerebral infarction ([place])   . History of cerebrovascular accident (CVA) with residual deficit 2002 and 2003--  hemiparisis both sides   per MRI  anterior left frontal lobe, left para midline pons, and inferior cerebullam bilaterally infarcts  . History of pulmonary embolus (PE)    06-30-2012  extensive bilaterally  . History of recurrent TIAs   . History of syncope    hx multiple pre-syncope and syncopal episodes due to vasovagal, orthostatic hypotension, dehydration  . History of TIAs   . Hyperlipidemia   . Hypertension   . Mild atherosclerosis of carotid artery, bilateral    per last duplex 11-04-2014  bilateral ICA 1--39%  . Neuropathy   . OSA on CPAP    followed by dr Pharrell Ledford--  sev. osa w/ AHI 65.9  . Prostate cancer ([place])   . S/P coronary artery stent placement 2010   stenting to mid LAD  . Simple renal cyst    bilaterally  . Trigger finger of both hands 11-17-13  . Type 2 diabetes mellitus ([place]) dx 1986   last one A1c 9.2 on 04-26-2016  . Unsteady gait    . Hx prior CVA/TIAs;  . Vertebral artery occlusion, left    chronic    PAST SURGICAL HISTORY: Past Surgical History:  Procedure Laterality Date  . ANTERIOR CERVICAL DECOMP/DISCECTOMY FUSION  2004   C3 --  C6  . CARDIAC CATHETERIZATION  06-10-2010   dr Irish Lack   wide patent LAD stent, mid lesion at the origin of the septal prior to the previous stent 40-50%/  normal LVF, ef 55%  . CARDIOVASCULAR STRESS TEST  10-23-2012  dr Irish Lack   normal nuclear perfusion study w/ no ischemia/  normal LV function and wall motion , ef 65%  . CARPAL TUNNEL RELEASE Bilateral   . CATARACT EXTRACTION W/ INTRAOCULAR LENS  IMPLANT, BILATERAL    . CHOLECYSTECTOMY N/A 11/02/2015   Procedure: LAPAROSCOPIC CHOLECYSTECTOMY WITH INTRAOPERATIVE CHOLANGIOGRAM;  Surgeon: Donnie Mesa, MD;  Location: [facility];  Service: General;  Laterality: N/A;    . COLONOSCOPY    . CORONARY ANGIOPLASTY WITH STENT PLACEMENT  02/2008   stenting to mid LAD  . LEFT HEART CATHETERIZATION WITH CORONARY ANGIOGRAM N/A 04/03/2013   Procedure: LEFT HEART CATHETERIZATION WITH CORONARY ANGIOGRAM;  Surgeon: Jettie Booze, MD;  Location: [facility] CATH LAB;  Service: Cardiovascular;  Laterality: N/A;  patent mLAD stent  w/ mild disease in remainder LAD and its branches;  mod. focal lesion midLCFx- FFR of lesion was negative for ischemia/  normal LVSF, ef 50%  . lungs     "fluid pumped off lungs"  . NEUROPLASTY / TRANSPOSITION ULNAR NERVE AT ELBOW Right   . PROSTATE BIOPSY N/A 08/31/2016   Procedure: PROSTATE  BIOPSY TRANSRECTAL ULTRASONIC PROSTATE (TUBP);  Surgeon: Ardis Hughs, MD;  Location: [facility];  Service: Urology;  Laterality: N/A;  . TRANSTHORACIC ECHOCARDIOGRAM  04/27/2016   severe focal basal LVH, ef 60-65%,  grade 2 diastoilc dysfunction/  mild AR, MR, and TR/  atrial septum lipomatous hypertrophy/  PASP 34mmHg  . UMBILICAL HERNIA REPAIR      FAMILY HISTORY: Family History  Problem Relation Age of Onset  . Aneurysm Mother   . Cancer Father        unknown either pancreatic or prostate  . Heart attack Neg Hx     SOCIAL HISTORY: Social History   Social History  . Marital status: Divorced    Spouse name: N/A  . Number of children: 4  . Years of education: 12   Occupational History  .  Retired    retired   Social History Main Topics  . Smoking status: Never Smoker  . Smokeless tobacco: Never Used  . Alcohol use No  . Drug use: No  . Sexual activity: Yes   Other Topics Concern  . Not on file   Social History Narrative   Patient lives at home alone and he is single.  Patient is retired.    Caffeine - one cups daily.   Right handed.   Patient has a high school education.   Patient has four adult children.      PHYSICAL EXAM  Vitals:   09/28/16 1005  BP: 118/62  Pulse: 61  Weight: 189 lb (85.7 kg)   Height: 5\' 5"  (1.651 m)   Body mass index is 31.45 kg/m.  Generalized: Well developed, in no acute distress   Neurological examination  Mentation: Alert oriented to time, place, history taking.  Montreal Cognitive Assessment  02/21/2016  Visuospatial/ Executive (0/5) 3  Naming (0/3) 3  Attention: Read list of digits (0/2) 2  Attention: Read list of letters (0/1) 0  Attention: Serial 7 subtraction starting at 100 (0/3) 0  Language: Repeat phrase (0/2) 2  Language : Fluency (0/1) 1  Abstraction (0/2) 2  Delayed Recall (0/5) 5  Orientation (0/6)  5  Total 23  Adjusted Score (based on education) 24    Follows all commands speech and language fluent Cranial nerve :  Taste and smell preserved , Pupils were equal round reactive to light. Extraocular movements were full, visual field were full on confrontational test. Facial sensation and strength were normal. Uvula tongue midline. Mallampati 3+ Head turning and shoulder shrug were normal and symmetric.Motor:   symmetric motor tone is noted throughout, no hemiparesis or spasticity. .  Sensory: Sensory testing is intact to soft touch on all 4 extremities. Gait and station: Gait is normal. No drift is seen. Reflexes: Deep tendon reflexes are symmetric  bilaterally.   DIAGNOSTIC DATA (LABS, IMAGING, TESTING) - I reviewed patient records, labs, notes, testing and imaging myself where available.   Lab Results  Component Value Date   WBC 4.3 05/19/2016   HGB 12.6 (L) 08/31/2016   HCT 37.0 (L) 08/31/2016   MCV 91.3 05/19/2016   PLT 173 05/19/2016    ASSESSMENT AND PLAN 76 y.o. year old male who has been followed for CPAP compliance in the sleep clinic of the [place] office,  has now been non compliant with CPAP since March 2017, almost a year.  Multi infarct dementia- stroke prevention is main goal, I will ask him to keep his follow ups with Dr. Caroline Sauger at [facility] clinic. His MRI comparison was unremarkable. I see no change  in the left frontal lobe, no new hemosiderin deposit, the same remote stroke.  He is at higher risk of embolic strokes as long as OSA was untreated.  He  wanted to restart CPAP therapy and he will from now on see my NP once a year.   Larey Seat, MD  09/28/2016, 10:32 AM [facility] [address] [place], [place] [address] [phone]

## 2016-09-28 NOTE — Telephone Encounter (Signed)
Received voicemail message from patient requesting return call. Phoned patient at home. No answer. Left message with my contact number. Awaiting return call.

## 2016-09-29 DIAGNOSIS — Z23 Encounter for immunization: Secondary | ICD-10-CM | POA: Diagnosis not present

## 2016-09-29 DIAGNOSIS — Z79899 Other long term (current) drug therapy: Secondary | ICD-10-CM | POA: Diagnosis not present

## 2016-09-29 DIAGNOSIS — E782 Mixed hyperlipidemia: Secondary | ICD-10-CM | POA: Diagnosis not present

## 2016-09-29 DIAGNOSIS — J329 Chronic sinusitis, unspecified: Secondary | ICD-10-CM | POA: Diagnosis not present

## 2016-09-29 DIAGNOSIS — J342 Deviated nasal septum: Secondary | ICD-10-CM | POA: Diagnosis not present

## 2016-10-02 ENCOUNTER — Telehealth: Payer: Self-pay | Admitting: Radiation Oncology

## 2016-10-02 NOTE — Telephone Encounter (Signed)
Attempted to reach patient a second time. No answer. Left message with my contact information.

## 2016-10-02 NOTE — Telephone Encounter (Signed)
I spoke with the patient and he's ready to move forward to schedule space OAR and fiducials.     Carola Rhine, PAC

## 2016-10-11 ENCOUNTER — Other Ambulatory Visit: Payer: Self-pay | Admitting: Urology

## 2016-10-11 DIAGNOSIS — C61 Malignant neoplasm of prostate: Secondary | ICD-10-CM

## 2016-10-12 ENCOUNTER — Telehealth: Payer: Self-pay | Admitting: Medical Oncology

## 2016-10-12 NOTE — Telephone Encounter (Signed)
I notified Steve Brown that he has been scheduled for gold markers and space OAR 11/15/16 at [facility] surgical center. I informed him that he will receive a call with instructions regarding procedure. I gave him my office number and asked him to call with questions or concerns. He voiced understanding and thanked me for getting him this information.

## 2016-10-12 NOTE — Telephone Encounter (Signed)
Spoke with Steve Brown to introduce myself as the prostate nurse navigator and my role. I was unable to meet him the day he consulted with Dr. Tammi Klippel. He states that he is going to do 8 weeks of radiation. I asked if he an appointment for his gold markers and space OAR. He states is has been waiting but has not heard.  I will follow up on this appointment and give him a call back. He voiced understanding.

## 2016-10-16 ENCOUNTER — Telehealth: Payer: Self-pay | Admitting: Medical Oncology

## 2016-10-16 DIAGNOSIS — G894 Chronic pain syndrome: Secondary | ICD-10-CM | POA: Diagnosis not present

## 2016-10-16 DIAGNOSIS — M542 Cervicalgia: Secondary | ICD-10-CM | POA: Diagnosis not present

## 2016-10-16 DIAGNOSIS — M79606 Pain in leg, unspecified: Secondary | ICD-10-CM | POA: Diagnosis not present

## 2016-10-16 DIAGNOSIS — M545 Low back pain: Secondary | ICD-10-CM | POA: Diagnosis not present

## 2016-10-16 NOTE — Telephone Encounter (Signed)
Mr. Gossman called stating that he has MRI scheduled for space OAR with gold marker placement. He states he needs to be in an open MRI. I will follow up with MRI and give him a call back.

## 2016-10-16 NOTE — Telephone Encounter (Signed)
I called MRI to ask about he open MRI machine for space OAR placement. I was informed that patient goes in feet first and only a small amount of the head is in. [facility] is the place the space OAR is done. They suggested we give patient small dose of ativan. I spoke with Steve Brown and he states that if he goes in feet first and he is not totally in the machine he will be ok. He does not want to take the ativan. I informed him that he can always call out and stop the MRI if he feels he can not tolerate it. He voiced understanding and thanked me for making him feel better about the procedure.

## 2016-10-17 DIAGNOSIS — L603 Nail dystrophy: Secondary | ICD-10-CM | POA: Diagnosis not present

## 2016-10-17 DIAGNOSIS — E1051 Type 1 diabetes mellitus with diabetic peripheral angiopathy without gangrene: Secondary | ICD-10-CM | POA: Diagnosis not present

## 2016-10-17 DIAGNOSIS — I739 Peripheral vascular disease, unspecified: Secondary | ICD-10-CM | POA: Diagnosis not present

## 2016-10-19 ENCOUNTER — Ambulatory Visit (HOSPITAL_COMMUNITY): Payer: Medicare Other

## 2016-10-20 DIAGNOSIS — D120 Benign neoplasm of cecum: Secondary | ICD-10-CM | POA: Diagnosis not present

## 2016-10-20 DIAGNOSIS — Z86010 Personal history of colonic polyps: Secondary | ICD-10-CM | POA: Diagnosis not present

## 2016-10-23 ENCOUNTER — Other Ambulatory Visit: Payer: Self-pay | Admitting: Urology

## 2016-10-26 ENCOUNTER — Telehealth: Payer: Self-pay | Admitting: Radiation Oncology

## 2016-10-26 NOTE — Telephone Encounter (Signed)
LM for pt's daughter to return call.   

## 2016-10-31 ENCOUNTER — Encounter: Payer: Self-pay | Admitting: Interventional Cardiology

## 2016-10-31 ENCOUNTER — Encounter (INDEPENDENT_AMBULATORY_CARE_PROVIDER_SITE_OTHER): Payer: Self-pay

## 2016-10-31 ENCOUNTER — Ambulatory Visit (INDEPENDENT_AMBULATORY_CARE_PROVIDER_SITE_OTHER): Payer: Medicare Other | Admitting: Interventional Cardiology

## 2016-10-31 VITALS — BP 132/72 | HR 61 | Ht 65.0 in | Wt 189.8 lb

## 2016-10-31 DIAGNOSIS — I2510 Atherosclerotic heart disease of native coronary artery without angina pectoris: Secondary | ICD-10-CM

## 2016-10-31 DIAGNOSIS — E1159 Type 2 diabetes mellitus with other circulatory complications: Secondary | ICD-10-CM | POA: Diagnosis not present

## 2016-10-31 DIAGNOSIS — E782 Mixed hyperlipidemia: Secondary | ICD-10-CM | POA: Diagnosis not present

## 2016-10-31 DIAGNOSIS — C61 Malignant neoplasm of prostate: Secondary | ICD-10-CM | POA: Diagnosis not present

## 2016-10-31 NOTE — Patient Instructions (Addendum)
Medication Instructions:  Your physician has recommended you make the following change in your medication:   STOP Aspirin  Labwork: None ordered  Testing/Procedures: None ordered  Follow-Up: Your physician wants you to follow-up in: 1 year with [name]. You will receive a reminder letter in the mail two months in advance. If you don't receive a letter, please call our office to schedule the follow-up appointment.   Any Other Special Instructions Will Be Listed Below (If Applicable).     If you need a refill on your cardiac medications before your next appointment, please call your pharmacy.

## 2016-10-31 NOTE — Progress Notes (Signed)
Cardiology Office Note   Date:  10/31/2016   ID:  Steve Brown, DOB 1940/12/31, MRN [id number]  PCP:  Lawerance Cruel, MD    No chief complaint on file. CAD   Wt Readings from Last 3 Encounters:  10/31/16 189 lb 12.8 oz (86.1 kg)  09/28/16 189 lb (85.7 kg)  09/25/16 188 lb 3.2 oz (85.4 kg)       History of Present Illness: Steve Brown is a 76 y.o. male  who had an LAD stent in 2010. 2012 cath showed patent stent and no significant CAD. He had bilateral PE in 7/14. He has been on Xarelto ever since. He had a cath showing moderate disease which was negative by FFR in 2014.  No further CP. He had neck surgery in 2004.No cardiac problems at that time.   He was at a picnic and got dizzy in 5/18.  He had a presyncopal spell.  He was admitted to the hospital.  It was thought to be due to dehydration.  He was rehydrated and Cr improved.  4/18 echo showed normal LV function.  He has had some UTIs as well.  He had some blood in the urine at times.    Since the last visit, he has done well.  Exercises regularly, mostly walking.  Denies : Chest pain. Dizziness. Leg edema. Nitroglycerin use. Orthopnea. Palpitations. Paroxysmal nocturnal dyspnea. Shortness of breath. Syncope.   He will be starting radiation for his prostate cancer.      Past Medical History:  Diagnosis Date  . Abnormal prostate biopsy   . Anticoagulant long-term use    currently xarelto  . BPH with elevated PSA   . CKD (chronic kidney disease), stage II   . Coronary artery disease    CARDIOLOGIST-  DR Irish Lack--  2010-- PCI w/ stenting midLAD  . DDD (degenerative disc disease), lumbar   . Degeneration of cervical intervertebral disc   . Depression   . Dyspnea on exertion   . GERD (gastroesophageal reflux disease)   . Hemiparesis due to cerebral infarction   . History of cerebrovascular accident (CVA) with residual deficit 2002 and 2003--  hemiparisis both sides   per MRI  anterior left  frontal lobe, left para midline pons, and inferior cerebullam bilaterally infarcts  . History of pulmonary embolus (PE)    06-30-2012  extensive bilaterally  . History of recurrent TIAs   . History of syncope    hx multiple pre-syncope and syncopal episodes due to vasovagal, orthostatic hypotension, dehydration  . History of TIAs   . Hyperlipidemia   . Hypertension   . Mild atherosclerosis of carotid artery, bilateral    per last duplex 11-04-2014  bilateral ICA 1--39%  . Neuropathy   . OSA on CPAP    followed by dr dohmeier--  sev. osa w/ AHI 65.9  . Prostate cancer ([name])   . S/P coronary artery stent placement 2010   stenting to mid LAD  . Simple renal cyst    bilaterally  . Trigger finger of both hands 11-17-13  . Type 2 diabetes mellitus ([place]) dx 1986   last one A1c 9.2 on 04-26-2016  . Unsteady gait    . Hx prior CVA/TIAs;  . Vertebral artery occlusion, left    chronic    Past Surgical History:  Procedure Laterality Date  . ANTERIOR CERVICAL DECOMP/DISCECTOMY FUSION  2004   C3 -- C6  . CARDIAC CATHETERIZATION  06-10-2010   dr Irish Lack   wide patent  LAD stent, mid lesion at the origin of the septal prior to the previous stent 40-50%/  normal LVF, ef 55%  . CARDIOVASCULAR STRESS TEST  10-23-2012  dr Irish Lack   normal nuclear perfusion study w/ no ischemia/  normal LV function and wall motion , ef 65%  . CARPAL TUNNEL RELEASE Bilateral   . CATARACT EXTRACTION W/ INTRAOCULAR LENS  IMPLANT, BILATERAL    . CHOLECYSTECTOMY N/A 11/02/2015   Procedure: LAPAROSCOPIC CHOLECYSTECTOMY WITH INTRAOPERATIVE CHOLANGIOGRAM;  Surgeon: Donnie Mesa, MD;  Location: [facility];  Service: General;  Laterality: N/A;  . COLONOSCOPY    . CORONARY ANGIOPLASTY WITH STENT PLACEMENT  02/2008   stenting to mid LAD  . LEFT HEART CATHETERIZATION WITH CORONARY ANGIOGRAM N/A 04/03/2013   Procedure: LEFT HEART CATHETERIZATION WITH CORONARY ANGIOGRAM;  Surgeon: Jettie Booze, MD;  Location: [facility] CATH LAB;   Service: Cardiovascular;  Laterality: N/A;  patent mLAD stent  w/ mild disease in remainder LAD and its branches;  mod. focal lesion midLCFx- FFR of lesion was negative for ischemia/  normal LVSF, ef 50%  . lungs     "fluid pumped off lungs"  . NEUROPLASTY / TRANSPOSITION ULNAR NERVE AT ELBOW Right   . PROSTATE BIOPSY N/A 08/31/2016   Procedure: PROSTATE  BIOPSY TRANSRECTAL ULTRASONIC PROSTATE (TUBP);  Surgeon: Ardis Hughs, MD;  Location: [facility];  Service: Urology;  Laterality: N/A;  . TRANSTHORACIC ECHOCARDIOGRAM  04/27/2016   severe focal basal LVH, ef 60-65%,  grade 2 diastoilc dysfunction/  mild AR, MR, and TR/  atrial septum lipomatous hypertrophy/  PASP 25mmHg  . UMBILICAL HERNIA REPAIR       Current Outpatient Prescriptions  Medication Sig Dispense Refill  . Ascorbic Acid (VITAMIN C PO) Take 1 tablet by mouth daily.    Marland Kitchen aspirin EC 81 MG tablet Take 1 tablet (81 mg total) by mouth daily.    . BD PEN NEEDLE NANO U/F 32G X 4 MM MISC by Does not apply route.     . bisacodyl (DULCOLAX) 5 MG EC tablet Take 5 mg by mouth daily as needed for moderate constipation.    . cholecalciferol (VITAMIN D) 1000 UNITS tablet Take 1,000 Units by mouth daily.    . diclofenac sodium (VOLTAREN) 1 % GEL Apply 1 application topically daily as needed.  1  . dutasteride (AVODART) 0.5 MG capsule Take 0.5 mg by mouth daily.    . fluticasone (FLONASE) 50 MCG/ACT nasal spray Place 1 spray into both nostrils daily.  0  . furosemide (LASIX) 20 MG tablet Take 20 mg by mouth daily as needed (swelling).     Marland Kitchen HUMALOG MIX 75/25 KWIKPEN (75-25) 100 UNIT/ML Kwikpen Inject 18-20 Units into the skin 2 (two) times daily. 18 units in evening, and 20 units in the morning.  1  . memantine (NAMENDA) 10 MG tablet Take 1 tablet (10 mg total) by mouth 2 (two) times daily. 180 tablet 1  . metFORMIN (GLUCOPHAGE-XR) 500 MG 24 hr tablet Take 500 mg by mouth 2 (two) times daily.    . nitroGLYCERIN (NITROSTAT)  0.4 MG SL tablet Place 1 tablet (0.4 mg total) under the tongue every 5 (five) minutes as needed for chest pain. 25 tablet 12  . pantoprazole (PROTONIX) 40 MG tablet Take 40 mg by mouth daily.    . pregabalin (LYRICA) 300 MG capsule Take 1 capsule (300 mg total) by mouth 2 (two) times daily. 180 capsule 3  . ramipril (ALTACE) 2.5 MG capsule Take  2.5 mg by mouth daily.  1  . repaglinide (PRANDIN) 1 MG tablet Take 1 mg by mouth daily as needed.    . rivaroxaban (XARELTO) 20 MG TABS tablet Take 1 tablet (20 mg total) by mouth daily. Resume in 72 hours after procedure. 30 tablet   . rosuvastatin (CRESTOR) 10 MG tablet Take 10 mg by mouth every evening.     . sulfamethoxazole-trimethoprim (BACTRIM DS,SEPTRA DS) 800-160 MG tablet Take 1 tablet by mouth 2 (two) times daily.    . tamsulosin (FLOMAX) 0.4 MG CAPS capsule Take 0.4 mg by mouth daily.    . valACYclovir (VALTREX) 500 MG tablet Take 500 mg by mouth daily.     No current facility-administered medications for this visit.     Allergies:   Other and Phenergan [promethazine]    Social History:  The patient  reports that he has never smoked. He has never used smokeless tobacco. He reports that he does not drink alcohol or use drugs.   Family History:  The patient's family history includes Aneurysm in his mother; Cancer in his father.    ROS:  Please see the history of present illness.   Otherwise, review of systems are positive for occasional fatigue.   All other systems are reviewed and negative.    PHYSICAL EXAM: VS:  BP 132/72   Pulse 61   Ht 5\' 5"  (1.651 m)   Wt 189 lb 12.8 oz (86.1 kg)   SpO2 97%   BMI 31.58 kg/m  , BMI Body mass index is 31.58 kg/m. GEN: Well nourished, well developed, in no acute distress  HEENT: normal  Neck: no JVD, carotid bruits, or masses Cardiac: RRR; no murmurs, rubs, or gallops,no edema  Respiratory:  clear to auscultation bilaterally, normal work of breathing GI: soft, nontender, nondistended, +  BS MS: no deformity or atrophy  Skin: warm and dry, no rash Neuro:  Strength and sensation are intact Psych: euthymic mood, full affect   EKG:   The ekg ordered 5/18 demonstrates NSR, no Significant ST changes   Recent Labs: 05/18/2016: ALT 22 05/19/2016: BUN 13; Creatinine, Ser 1.28; Magnesium 1.9; Platelets 173 08/31/2016: Hemoglobin 12.6; Potassium 4.2; Sodium 138   Lipid Panel    Component Value Date/Time   CHOL 144 03/21/2013 0400   TRIG 122 03/21/2013 0400   HDL 57 03/21/2013 0400   CHOLHDL 2.5 03/21/2013 0400   VLDL 24 03/21/2013 0400   LDLCALC 63 03/21/2013 0400     Other studies Reviewed: Additional studies/ records that were reviewed today with results demonstrating: lipids from PMD reviewed.   ASSESSMENT AND PLAN:  1. CAD: No angina on current medical therapy.  Stop aspirin since he is on Xarelto.  THis should reduce bleeding risk given prostate issues and upcoming radiation. 2. Hyperlipidemia: LDL 63 in 9/18 3. DM: Continue aggressive medical therapy with PMD.  A1C 9.2 in 4/18.  Continue regularl walking and healthy diet should help.   4. Prostate cancer: starting radiation.   Current medicines are reviewed at length with the patient today.  The patient concerns regarding his medicines were addressed.  The following changes have been made:  No change  Labs/ tests ordered today include:  No orders of the defined types were placed in this encounter.   Recommend 150 minutes/week of aerobic exercise Low fat, low carb, high fiber diet recommended  Disposition:   FU in 1 year   Signed, Larae Grooms, MD  10/31/2016 12:03 PM    [facility] [place], [place], [place]  [address] Phone: [phone]; Fax: [phone]

## 2016-11-02 ENCOUNTER — Telehealth: Payer: Self-pay | Admitting: Radiation Oncology

## 2016-11-02 NOTE — Telephone Encounter (Signed)
I spoke with the patient's daughter to update her on her father's care after she had called wanting to discuss his case.

## 2016-11-07 ENCOUNTER — Encounter (HOSPITAL_BASED_OUTPATIENT_CLINIC_OR_DEPARTMENT_OTHER): Payer: Self-pay | Admitting: *Deleted

## 2016-11-07 NOTE — Progress Notes (Signed)
Npo after midnight arrive 630 am [facility] take avodart, flonase nasal spray, lyrica, tamsulosin, sip of water in am, take 1/2 dose of humalog 75/35 insulin at hs 11-14-16 bring cpap, mask, tubing and maching needs I stat 8 and poct and ekg

## 2016-11-12 ENCOUNTER — Encounter (HOSPITAL_BASED_OUTPATIENT_CLINIC_OR_DEPARTMENT_OTHER): Payer: Self-pay | Admitting: *Deleted

## 2016-11-12 ENCOUNTER — Emergency Department (HOSPITAL_BASED_OUTPATIENT_CLINIC_OR_DEPARTMENT_OTHER): Payer: Medicare Other

## 2016-11-12 ENCOUNTER — Emergency Department (HOSPITAL_BASED_OUTPATIENT_CLINIC_OR_DEPARTMENT_OTHER)
Admission: EM | Admit: 2016-11-12 | Discharge: 2016-11-12 | Disposition: A | Discharge status: Home / Self Care | Payer: Medicare Other | Attending: Emergency Medicine | Admitting: Emergency Medicine

## 2016-11-12 DIAGNOSIS — Z8673 Personal history of transient ischemic attack (TIA), and cerebral infarction without residual deficits: Secondary | ICD-10-CM | POA: Diagnosis not present

## 2016-11-12 DIAGNOSIS — E119 Type 2 diabetes mellitus without complications: Secondary | ICD-10-CM | POA: Diagnosis not present

## 2016-11-12 DIAGNOSIS — E785 Hyperlipidemia, unspecified: Secondary | ICD-10-CM | POA: Diagnosis not present

## 2016-11-12 DIAGNOSIS — M545 Low back pain, unspecified: Secondary | ICD-10-CM

## 2016-11-12 DIAGNOSIS — G8929 Other chronic pain: Secondary | ICD-10-CM

## 2016-11-12 DIAGNOSIS — Z79899 Other long term (current) drug therapy: Secondary | ICD-10-CM | POA: Insufficient documentation

## 2016-11-12 DIAGNOSIS — Z794 Long term (current) use of insulin: Secondary | ICD-10-CM | POA: Insufficient documentation

## 2016-11-12 DIAGNOSIS — Z86718 Personal history of other venous thrombosis and embolism: Secondary | ICD-10-CM | POA: Diagnosis not present

## 2016-11-12 DIAGNOSIS — M25512 Pain in left shoulder: Secondary | ICD-10-CM | POA: Diagnosis not present

## 2016-11-12 DIAGNOSIS — I2510 Atherosclerotic heart disease of native coronary artery without angina pectoris: Secondary | ICD-10-CM | POA: Insufficient documentation

## 2016-11-12 DIAGNOSIS — I129 Hypertensive chronic kidney disease with stage 1 through stage 4 chronic kidney disease, or unspecified chronic kidney disease: Secondary | ICD-10-CM | POA: Insufficient documentation

## 2016-11-12 DIAGNOSIS — N182 Chronic kidney disease, stage 2 (mild): Secondary | ICD-10-CM | POA: Insufficient documentation

## 2016-11-12 DIAGNOSIS — M542 Cervicalgia: Secondary | ICD-10-CM | POA: Diagnosis not present

## 2016-11-12 DIAGNOSIS — I10 Essential (primary) hypertension: Secondary | ICD-10-CM | POA: Diagnosis not present

## 2016-11-12 LAB — CBC WITH DIFFERENTIAL/PLATELET
Basophils Absolute: 0 10*3/uL (ref 0.0–0.1)
Basophils Relative: 0 %
Eosinophils Absolute: 0.2 10*3/uL (ref 0.0–0.7)
Eosinophils Relative: 3 %
HCT: 36.8 % — ABNORMAL LOW (ref 39.0–52.0)
Hemoglobin: 12.9 g/dL — ABNORMAL LOW (ref 13.0–17.0)
Lymphocytes Relative: 41 %
Lymphs Abs: 2.1 10*3/uL (ref 0.7–4.0)
MCH: 30.9 pg (ref 26.0–34.0)
MCHC: 35.1 g/dL (ref 30.0–36.0)
MCV: 88.2 fL (ref 78.0–100.0)
Monocytes Absolute: 0.5 10*3/uL (ref 0.1–1.0)
Monocytes Relative: 10 %
Neutro Abs: 2.2 10*3/uL (ref 1.7–7.7)
Neutrophils Relative %: 46 %
Platelets: 225 10*3/uL (ref 150–400)
RBC: 4.17 MIL/uL — ABNORMAL LOW (ref 4.22–5.81)
RDW: 12.5 % (ref 11.5–15.5)
WBC: 5 10*3/uL (ref 4.0–10.5)

## 2016-11-12 LAB — BASIC METABOLIC PANEL
Anion gap: 5 (ref 5–15)
BUN: 19 mg/dL (ref 6–20)
CO2: 27 mmol/L (ref 22–32)
Calcium: 8.7 mg/dL — ABNORMAL LOW (ref 8.9–10.3)
Chloride: 104 mmol/L (ref 101–111)
Creatinine, Ser: 1.41 mg/dL — ABNORMAL HIGH (ref 0.61–1.24)
GFR calc Af Amer: 55 mL/min — ABNORMAL LOW (ref >60)
GFR calc non Af Amer: 48 mL/min — ABNORMAL LOW (ref >60)
Glucose, Bld: 124 mg/dL — ABNORMAL HIGH (ref 65–99)
Potassium: 4 mmol/L (ref 3.5–5.1)
Sodium: 136 mmol/L (ref 135–145)

## 2016-11-12 MED ORDER — ACETAMINOPHEN 500 MG PO TABS
1000.0000 mg | ORAL_TABLET | Freq: Once | ORAL | Status: AC
  Administered 2016-11-12: 1000 mg via ORAL
  Filled 2016-11-12: qty 2

## 2016-11-12 NOTE — ED Triage Notes (Signed)
Pt reports body aches, shakiness and intermittent dizziness x 3 days. Denies fevers.

## 2016-11-12 NOTE — ED Notes (Signed)
IV attempt.

## 2016-11-12 NOTE — ED Notes (Signed)
ED Provider at bedside. 

## 2016-11-12 NOTE — ED Provider Notes (Signed)
[facility] Provider Note  CSN: [id number] Arrival date & time: 11/12/16 1637  Chief Complaint(s) Shoulder Pain and Back Pain  HPI Steve Brown is a 75 y.o. male with a history of degenerative disc disease in the cervical spine status post fusion with associated chronic upper back and left shoulder pain presents to the emergency department with exacerbation of his chronic pain and new onset of lower back pain.  He denies any trauma, heavy lifting, twisting.  Pain is exacerbated with movement and palpation.  His symptoms have been worsening over the past 3 or 4 days.  Intermittent in nature.  Alleviated by certain positions as well as Tylenol.  He denies any fevers, chills, chest pain, shortness of breath, abdominal pain, urinary or bowel incontinence, lower extremity weakness or numbness.  No urinary symptoms.  No nausea, vomiting, or diarrhea.  No other alleviating or aggravating factors.  He denies any other physical complaints.  Of note patient does have a history of prostate cancer and is currently undergoing evaluation for radiation.  He is not currently being treated with any chemotherapy.   HPI  Past Medical History Past Medical History:  Diagnosis Date  . Abnormal prostate biopsy   . Anticoagulant long-term use    currently xarelto  . BPH with elevated PSA   . CKD (chronic kidney disease), stage II   . Complication of anesthesia    limted neck rom limited use of left arm due to cva  . Coronary artery disease    CARDIOLOGIST-  DR Irish Lack--  2010-- PCI w/ stenting midLAD  . DDD (degenerative disc disease), lumbar   . Degeneration of cervical intervertebral disc   . Depression   . Dyspnea on exertion   . GERD (gastroesophageal reflux disease)   . Hemiparesis due to cerebral infarction   . History of cerebrovascular accident (CVA) with residual deficit 2002 and 2003--  hemiparisis both sides   per MRI  anterior left frontal lobe, left para  midline pons, and inferior cerebullam bilaterally infarcts  . History of pulmonary embolus (PE)    06-30-2012  extensive bilaterally  . History of recurrent TIAs   . History of syncope    hx multiple pre-syncope and syncopal episodes due to vasovagal, orthostatic hypotension, dehydration  . History of TIAs    several since 2002  . Hyperlipidemia   . Hypertension   . Mild atherosclerosis of carotid artery, bilateral    per last duplex 11-04-2014  bilateral ICA 1--39%  . Neuropathy    fingers  . OSA on CPAP    followed by dr dohmeier--  sev. osa w/ AHI 65.9  . Prostate cancer ([place]) dx 2018  . S/P coronary artery stent placement 2010   stenting to mid LAD  . Simple renal cyst    bilaterally  . Trigger finger of both hands 11-17-13  . Type 2 diabetes mellitus ([place]) dx 1986   last one A1c 9.2 on 04-26-2016  . Unsteady gait    . Hx prior CVA/TIAs;  . Vertebral artery occlusion, left    chronic   Patient Active Problem List   Diagnosis Date Noted  . Malignant neoplasm of prostate ([place]) 09/28/2016  . Vascular dementia in remission 09/28/2016  . Remote history of stroke 09/28/2016  . Acute kidney injury ([place]) 05/18/2016  . Dehydration 05/18/2016  . Near syncope 05/18/2016  . Orthostatic hypotension 05/18/2016  . CKD (chronic kidney disease), stage II 04/26/2016  . UTI (urinary tract infection) 04/26/2016  .  Encounter for counseling on use of CPAP 11/18/2015  . Chronic cholecystitis with calculus 11/02/2015  . TIA (transient ischemic attack) 11/03/2014  . OSA on CPAP 05/18/2014  . White matter disease 08/14/2013  . OSA (obstructive sleep apnea) 08/14/2013  . Unsteady gait 05/19/2013  . Mixed hyperlipidemia 04/24/2013  . Syncope 03/20/2013  . Chest pain 10/08/2012  . Hypertension   . Stroke ([place])   . History of TIAs   . Lumbago   . Other and unspecified hyperlipidemia   . Personal history of unspecified circulatory disease   . Unspecified fall   . Pain in joint, multiple  sites   . Degeneration of cervical intervertebral disc   . Unspecified cardiovascular disease   . History of pulmonary embolism: June 2014,  Takes Xarelto 07/01/2012    Class: History of  . Hemiparesis ([place]) 07/18/2011  . Diabetes mellitus type 2 with complications ([place]) 33/82/5053  . HTN (hypertension) 07/18/2011  . CAD in native artery 07/18/2011  . History of recurrent TIAs 07/18/2011   Home Medication(s) Prior to Admission medications   Medication Sig Start Date End Date Taking? Authorizing Provider  Ascorbic Acid (VITAMIN C PO) Take 1 tablet by mouth daily.    [provider]  BD PEN NEEDLE NANO U/F 32G X 4 MM MISC by Does not apply route.  09/01/13   [provider]  bisacodyl (DULCOLAX) 5 MG EC tablet Take 5 mg by mouth daily as needed for moderate constipation.    [provider]  cholecalciferol (VITAMIN D) 1000 UNITS tablet Take 1,000 Units by mouth daily.    [provider]  diclofenac sodium (VOLTAREN) 1 % GEL Apply 1 application topically daily as needed. 10/16/16   [provider]  dutasteride (AVODART) 0.5 MG capsule Take 0.5 mg by mouth daily.    [provider]  fluticasone (FLONASE) 50 MCG/ACT nasal spray Place 1 spray into both nostrils daily. 09/26/16   [provider]  furosemide (LASIX) 20 MG tablet Take 20 mg by mouth daily as needed (swelling).     [provider]  HUMALOG MIX 75/25 KWIKPEN (75-25) 100 UNIT/ML Kwikpen Inject 18-20 Units into the skin 2 (two) times daily. 18 units in evening, and30 units in the morning. 10/21/13   [provider]  memantine (NAMENDA) 10 MG tablet Take 1 tablet (10 mg total) by mouth 2 (two) times daily. Patient taking differently: Take 10 mg by mouth 2 (two) times daily. Pt out ot med 06/06/16   Dohmeier, Asencion Partridge, MD  metFORMIN (GLUCOPHAGE-XR) 500 MG 24 hr tablet Take 500 mg by mouth every evening.  04/02/16   [provider]  nitroGLYCERIN (NITROSTAT)  0.4 MG SL tablet Place 1 tablet (0.4 mg total) under the tongue every 5 (five) minutes as needed for chest pain. 12/07/15   Jettie Booze, MD  pantoprazole (PROTONIX) 40 MG tablet Take 40 mg by mouth daily. Pt out of med    [provider]  pregabalin (LYRICA) 300 MG capsule Take 1 capsule (300 mg total) by mouth 2 (two) times daily. 07/26/12   Dohmeier, Asencion Partridge, MD  ramipril (ALTACE) 2.5 MG capsule Take 2.5 mg by mouth daily. 09/08/14   [provider]  repaglinide (PRANDIN) 1 MG tablet Take 1 mg by mouth daily as needed. 05/17/16   [provider]  rivaroxaban (XARELTO) 20 MG TABS tablet Take 1 tablet (20 mg total) by mouth daily. Resume in 72 hours after procedure. 08/31/16   Ardis Hughs, MD  rosuvastatin (CRESTOR) 10 MG tablet Take 10 mg by mouth every evening.     [provider]  sulfamethoxazole-trimethoprim (BACTRIM DS,SEPTRA DS) 800-160 MG tablet Take 1 tablet by mouth 2 (two) times daily. 05/09/16   [provider]  tamsulosin (FLOMAX) 0.4 MG CAPS capsule Take 0.4 mg by mouth daily. 05/09/16   [provider]  valACYclovir (VALTREX) 500 MG tablet Take 500 mg by mouth daily.    [provider]                                                                                                                                    Past Surgical History Past Surgical History:  Procedure Laterality Date  . ANTERIOR CERVICAL DECOMP/DISCECTOMY FUSION  2004   C3 -- C6 limited rom  . CARDIAC CATHETERIZATION  06-10-2010   dr Irish Lack   wide patent LAD stent, mid lesion at the origin of the septal prior to the previous stent 40-50%/  normal LVF, ef 55%  . CARDIOVASCULAR STRESS TEST  10-23-2012  dr Irish Lack   normal nuclear perfusion study w/ no ischemia/  normal LV function and wall motion , ef 65%  . CARPAL TUNNEL RELEASE Bilateral   . CATARACT EXTRACTION W/ INTRAOCULAR LENS  IMPLANT, BILATERAL    . COLONOSCOPY    . CORONARY  ANGIOPLASTY WITH STENT PLACEMENT  02/2008   stenting to mid LAD  . lungs  2005   "fluid pumped off lungs"  . NEUROPLASTY / TRANSPOSITION ULNAR NERVE AT ELBOW Right 2004  . TRANSTHORACIC ECHOCARDIOGRAM  04/27/2016   severe focal basal LVH, ef 60-65%,  grade 2 diastoilc dysfunction/  mild AR, MR, and TR/  atrial septum lipomatous hypertrophy/  PASP 90mmHg  . UMBILICAL HERNIA REPAIR     Family History Family History  Problem Relation Age of Onset  . Aneurysm Mother   . Cancer Father        unknown either pancreatic or prostate  . Heart attack Neg Hx     Social History Social History   Tobacco Use  . Smoking status: Never Smoker  . Smokeless tobacco: Never Used  Substance Use Topics  . Alcohol use: No    Alcohol/week: 0.0 oz  . Drug use: No   Allergies Other and Phenergan [promethazine]  Review of Systems Review of Systems All other systems are reviewed and are negative for acute change except as noted in the HPI  Physical Exam Vital Signs  I have reviewed the triage vital signs BP 134/78 (BP Location: Right Arm)   Pulse 88   Temp 97.7 F (36.5 C)   Resp 17   Ht 5\' 5"  (1.651 m)   Wt 85.7 kg (189 lb)   SpO2 100%   BMI 31.45 kg/m    Physical Exam  Constitutional: He is oriented to person, place, and time. He appears well-developed and well-nourished. No distress.  HENT:  Head: Normocephalic and  atraumatic.  Nose: Nose normal.  Eyes: Conjunctivae and EOM are normal. Pupils are equal, round, and reactive to light. Right eye exhibits no discharge. Left eye exhibits no discharge. No scleral icterus.  Neck: Normal range of motion. Neck supple.  Cardiovascular: Normal rate and regular rhythm. Exam reveals no gallop and no friction rub.  No murmur heard. Pulmonary/Chest: Effort normal and breath sounds normal. No stridor. No respiratory distress. He has no rales.  Abdominal: Soft. He exhibits no distension. There is no tenderness.  Musculoskeletal: He exhibits no  edema.       Cervical back: He exhibits tenderness. He exhibits no bony tenderness and no deformity.       Lumbar back: He exhibits tenderness. He exhibits no bony tenderness.       Back:  Neurological: He is alert and oriented to person, place, and time.  Spine Exam: Strength: 5/5 throughout LE bilaterally (hip flexion/extension, adduction/abduction; knee flexion/extension; foot dorsiflexion/plantarflexion, inversion/eversion; great toe inversion) Sensation: Intact to light touch in proximal and distal LE bilaterally Reflexes: 2+ quadriceps and achilles reflexes   Skin: Skin is warm and dry. No rash noted. He is not diaphoretic. No erythema.  Psychiatric: He has a normal mood and affect.  Vitals reviewed.   ED Results and Treatments Labs (all labs ordered are listed, but only abnormal results are displayed) Labs Reviewed  CBC WITH DIFFERENTIAL/PLATELET - Abnormal; Notable for the following components:      Result Value   RBC 4.17 (*)    Hemoglobin 12.9 (*)    HCT 36.8 (*)    All other components within normal limits  BASIC METABOLIC PANEL - Abnormal; Notable for the following components:   Glucose, Bld 124 (*)    Creatinine, Ser 1.41 (*)    Calcium 8.7 (*)    GFR calc non Af Amer 48 (*)    GFR calc Af Amer 55 (*)    All other components within normal limits                                                                                                                         EKG  EKG Interpretation  Date/Time:  Sunday November 12 2016 16:48:57 EST Ventricular Rate:  52 PR Interval:    QRS Duration: 104 QT Interval:  417 QTC Calculation: 388 R Axis:   -24 Text Interpretation:  Sinus rhythm Atrial premature complexes RSR' in V1 or V2, right VCD or RVH Inferior infarct, old Baseline wander in lead(s) V3 No significant change since last tracing Confirmed by Addison Lank [phone]) on 11/12/2016 4:52:53 PM      Radiology Dg Cervical Spine 2-3 Views  Result Date:  11/12/2016 CLINICAL DATA:  Patient with cervical spine pain for multiple days. No injury. EXAM: CERVICAL SPINE - 2-3 VIEW COMPARISON:  Cervical spine radiograph 03/09/2010. FINDINGS: Multilevel anterior cervical spinal fusion is demonstrated. Fusion and anterior cervical spinal hardware appear stable when compared to prior exam. Prevertebral soft tissues are unremarkable. Lung  apices are clear. IMPRESSION: Multilevel anterior cervical spinal fusion similar in appearance when compared to prior exam. No definite acute osseous abnormality. Electronically Signed   By: Lovey Newcomer M.D.   On: 11/12/2016 18:02   Dg Lumbar Spine 2-3 Views  Result Date: 11/12/2016 CLINICAL DATA:  Chronic low back pain with acute exacerbation over the last 5 days. EXAM: LUMBAR SPINE - 2-3 VIEW COMPARISON:  CT of the abdomen and pelvis 10/13/2015. FINDINGS: A superior endplate fracture at L4 is remote. Five non rib-bearing lumbar type vertebral bodies are present. Vertebral body heights are maintained otherwise. Disc spaces are stable. Advanced facet hypertrophy is again noted at L4-5 and L5-S1. Atherosclerotic calcifications are again noted within the aorta and branch vessels without aneurysm. IMPRESSION: 1. No acute abnormality or significant interval change. 2. Moderate facet hypertrophy at L4-5 and L5-S1. Foraminal disease is not excluded. 3. Atherosclerosis. Electronically Signed   By: San Morelle M.D.   On: 11/12/2016 18:04   Pertinent labs & imaging results that were available during my care of the patient were reviewed by me and considered in my medical decision making (see chart for details).  Medications Ordered in ED Medications  acetaminophen (TYLENOL) tablet 1,000 mg (1,000 mg Oral Given 11/12/16 1734)                                                                                                                                    Procedures Procedures  (including critical care time)  Medical Decision  Making / ED Course I have reviewed the nursing notes for this encounter and the patient's prior records (if available in EHR or on provided paperwork).    Given the patient's history of prostate cancer, plain films of the cervical and lumbar spine were obtained to assess for any bony lesions which were negative.  No neurologic deficits noted on exam concerning for cauda equina.  No infectious, respiratory, GI or urinary symptoms.  Screening labs obtained which are grossly reassuring.  The patient appears reasonably screened and/or stabilized for discharge and I doubt any other medical condition or other [facility] requiring further screening, evaluation, or treatment in the ED at this time prior to discharge.  The patient is safe for discharge with strict return precautions.   Final Clinical Impression(s) / ED Diagnoses Final diagnoses:  Acute bilateral low back pain without sciatica  Chronic left shoulder pain   Disposition: Discharge  Condition: Good  I have discussed the results, Dx and Tx plan with the patient who expressed understanding and agree(s) with the plan. Discharge instructions discussed at great length. The patient was given strict return precautions who verbalized understanding of the instructions. No further questions at time of discharge.    This SmartLink is deprecated. Use AVSMEDLIST instead to display the medication list for a patient.  Follow Up: Lawerance Cruel, [place] [id number] [phone]  Schedule an appointment as  soon as possible for a visit  If symptoms do not improve or  worsen       This chart was dictated using voice recognition software.  Despite best efforts to proofread,  errors can occur which can change the documentation meaning.  Fatima Blank, MD 11/12/16 2007

## 2016-11-12 NOTE — ED Notes (Signed)
Pt discharged to home NAD.  

## 2016-11-13 NOTE — Progress Notes (Signed)
PT CALLED VIA PHONE, HAD QUESTIONS.  WANTING TO VERITY INSTRUCTIONS ABOUT MEDICATION FOR DOS.  PT VERBALIZED UNDERSTANDING WHEN TOLD 1/2 DOSE HUMALOG 75/35 INSULIN NIGHT BEFORE SURGERY AND MORNING OF SURGERY ONLY TAKE FLOMAX, FLONASE SPRAY, LYRICA, AVODART W/ SIPS OF WATER.  AND ASKED IF OK TO TAKE TYLENOL FOR JOINT PAIN, PT VERBALIZED UNDERSTANDING TO OK TO TAKE TYLENOL FOR PAIN EVEN UP UNTIL MORNING OF SURGERY IF NEEDED. ADVISED ONLY TAKE 1000MG  AT ONE TIME EVERY 8 HOURS, THREE TIMES PER DAY NOT TO GO OVER TOTAL 3000MG  PER DAY.

## 2016-11-14 ENCOUNTER — Encounter (HOSPITAL_BASED_OUTPATIENT_CLINIC_OR_DEPARTMENT_OTHER): Payer: Self-pay | Admitting: Anesthesiology

## 2016-11-14 NOTE — Anesthesia Preprocedure Evaluation (Addendum)
Anesthesia Evaluation  Patient identified by MRN, date of birth, ID band Patient awake    Reviewed: Allergy & Precautions, NPO status , Patient's Chart, lab work & pertinent test results, reviewed documented beta blocker date and time   History of Anesthesia Complications (+) history of anesthetic complications  Airway Mallampati: III  TM Distance: >3 FB Neck ROM: Full    Dental no notable dental hx. (+) Teeth Intact, Caps   Pulmonary sleep apnea and Continuous Positive Airway Pressure Ventilation ,    Pulmonary exam normal breath sounds clear to auscultation       Cardiovascular hypertension, Pt. on medications and Pt. on home beta blockers + CAD, + Cardiac Stents and + Peripheral Vascular Disease  Normal cardiovascular exam Rhythm:Regular Rate:Normal  MI 2010- Stent mid LAD Hx/o PTE   Neuro/Psych PSYCHIATRIC DISORDERS Depression Unsteady gait Weakness left arm. Left leg TIACVA, Residual Symptoms    GI/Hepatic GERD  Controlled and Medicated,  Endo/Other  diabetes, Poorly Controlled, Type 2, Oral Hypoglycemic Agents, Insulin DependentObesity Hyperlipidemia  Renal/GU Renal InsufficiencyRenal disease   Prostate Ca    Musculoskeletal  (+) Arthritis , Osteoarthritis,  DDD lumbar and cervical spines   Abdominal (+) + obese,   Peds  Hematology  (+) anemia , Xarelto- last dose 3 days ago    Anesthesia Other Findings   Reproductive/Obstetrics                           Anesthesia Physical Anesthesia Plan  ASA: III  Anesthesia Plan: General   Post-op Pain Management:    Induction: Intravenous  PONV Risk Score and Plan: 3 and Propofol infusion, Ondansetron, Promethazine and Treatment may vary due to age or medical condition  Airway Management Planned: LMA  Additional Equipment:   Intra-op Plan:   Post-operative Plan: Extubation in OR  Informed Consent: I have reviewed the patients  History and Physical, chart, labs and discussed the procedure including the risks, benefits and alternatives for the proposed anesthesia with the patient or authorized representative who has indicated his/her understanding and acceptance.   Dental advisory given  Plan Discussed with: Anesthesiologist, CRNA and Surgeon  Anesthesia Plan Comments:        Anesthesia Quick Evaluation

## 2016-11-14 NOTE — Progress Notes (Signed)
Spoke with Coni Made-clarified if PT/PTT needed. Stated Dr Louis Meckel had standard orders only for pre op.PT/PTT canceled.

## 2016-11-15 ENCOUNTER — Ambulatory Visit (HOSPITAL_BASED_OUTPATIENT_CLINIC_OR_DEPARTMENT_OTHER)
Admission: RE | Admit: 2016-11-15 | Discharge: 2016-11-15 | Disposition: A | Discharge status: Home / Self Care | Payer: Medicare Other | Source: Ambulatory Visit | Attending: Urology | Admitting: Urology

## 2016-11-15 ENCOUNTER — Encounter (HOSPITAL_BASED_OUTPATIENT_CLINIC_OR_DEPARTMENT_OTHER): Payer: Self-pay

## 2016-11-15 ENCOUNTER — Ambulatory Visit (HOSPITAL_BASED_OUTPATIENT_CLINIC_OR_DEPARTMENT_OTHER): Payer: Medicare Other | Admitting: Anesthesiology

## 2016-11-15 ENCOUNTER — Encounter (HOSPITAL_BASED_OUTPATIENT_CLINIC_OR_DEPARTMENT_OTHER)
Admission: RE | Disposition: A | Discharge status: Home / Self Care | Payer: Self-pay | Source: Ambulatory Visit | Attending: Urology

## 2016-11-15 ENCOUNTER — Ambulatory Visit (HOSPITAL_COMMUNITY): Admission: RE | Admit: 2016-11-15 | Payer: Medicare Other | Source: Ambulatory Visit | Admitting: Urology

## 2016-11-15 DIAGNOSIS — Z9989 Dependence on other enabling machines and devices: Secondary | ICD-10-CM | POA: Diagnosis not present

## 2016-11-15 DIAGNOSIS — E1151 Type 2 diabetes mellitus with diabetic peripheral angiopathy without gangrene: Secondary | ICD-10-CM | POA: Insufficient documentation

## 2016-11-15 DIAGNOSIS — K219 Gastro-esophageal reflux disease without esophagitis: Secondary | ICD-10-CM | POA: Insufficient documentation

## 2016-11-15 DIAGNOSIS — Z6830 Body mass index (BMI) 30.0-30.9, adult: Secondary | ICD-10-CM | POA: Diagnosis not present

## 2016-11-15 DIAGNOSIS — E7800 Pure hypercholesterolemia, unspecified: Secondary | ICD-10-CM | POA: Diagnosis not present

## 2016-11-15 DIAGNOSIS — Z794 Long term (current) use of insulin: Secondary | ICD-10-CM | POA: Insufficient documentation

## 2016-11-15 DIAGNOSIS — I252 Old myocardial infarction: Secondary | ICD-10-CM | POA: Diagnosis not present

## 2016-11-15 DIAGNOSIS — G4730 Sleep apnea, unspecified: Secondary | ICD-10-CM | POA: Diagnosis not present

## 2016-11-15 DIAGNOSIS — I10 Essential (primary) hypertension: Secondary | ICD-10-CM | POA: Diagnosis not present

## 2016-11-15 DIAGNOSIS — C61 Malignant neoplasm of prostate: Secondary | ICD-10-CM

## 2016-11-15 DIAGNOSIS — E669 Obesity, unspecified: Secondary | ICD-10-CM | POA: Insufficient documentation

## 2016-11-15 DIAGNOSIS — Z7901 Long term (current) use of anticoagulants: Secondary | ICD-10-CM | POA: Insufficient documentation

## 2016-11-15 DIAGNOSIS — I69354 Hemiplegia and hemiparesis following cerebral infarction affecting left non-dominant side: Secondary | ICD-10-CM | POA: Insufficient documentation

## 2016-11-15 DIAGNOSIS — E1165 Type 2 diabetes mellitus with hyperglycemia: Secondary | ICD-10-CM | POA: Insufficient documentation

## 2016-11-15 DIAGNOSIS — E1122 Type 2 diabetes mellitus with diabetic chronic kidney disease: Secondary | ICD-10-CM | POA: Diagnosis not present

## 2016-11-15 DIAGNOSIS — I2510 Atherosclerotic heart disease of native coronary artery without angina pectoris: Secondary | ICD-10-CM | POA: Diagnosis not present

## 2016-11-15 DIAGNOSIS — Z955 Presence of coronary angioplasty implant and graft: Secondary | ICD-10-CM | POA: Diagnosis not present

## 2016-11-15 DIAGNOSIS — Z79899 Other long term (current) drug therapy: Secondary | ICD-10-CM | POA: Insufficient documentation

## 2016-11-15 DIAGNOSIS — I129 Hypertensive chronic kidney disease with stage 1 through stage 4 chronic kidney disease, or unspecified chronic kidney disease: Secondary | ICD-10-CM | POA: Diagnosis not present

## 2016-11-15 DIAGNOSIS — E785 Hyperlipidemia, unspecified: Secondary | ICD-10-CM | POA: Insufficient documentation

## 2016-11-15 DIAGNOSIS — N182 Chronic kidney disease, stage 2 (mild): Secondary | ICD-10-CM | POA: Diagnosis not present

## 2016-11-15 DIAGNOSIS — N400 Enlarged prostate without lower urinary tract symptoms: Secondary | ICD-10-CM | POA: Diagnosis not present

## 2016-11-15 HISTORY — PX: GOLD SEED IMPLANT: SHX6343

## 2016-11-15 HISTORY — DX: Other complications of anesthesia, initial encounter: T88.59XA

## 2016-11-15 HISTORY — PX: SPACE OAR INSTILLATION: SHX6769

## 2016-11-15 HISTORY — DX: Adverse effect of unspecified anesthetic, initial encounter: T41.45XA

## 2016-11-15 LAB — GLUCOSE, CAPILLARY
Glucose-Capillary: 91 mg/dL (ref 65–99)
Glucose-Capillary: 153 mg/dL — ABNORMAL HIGH (ref 65–99)

## 2016-11-15 SURGERY — INSERTION, GOLD SEEDS
Anesthesia: General | Site: Rectum

## 2016-11-15 MED ORDER — PROPOFOL 10 MG/ML IV BOLUS
INTRAVENOUS | Status: AC
  Filled 2016-11-15: qty 40

## 2016-11-15 MED ORDER — FLEET ENEMA 7-19 GM/118ML RE ENEM
1.0000 | ENEMA | Freq: Once | RECTAL | Status: AC
  Administered 2016-11-15: 1 via RECTAL
  Filled 2016-11-15: qty 1

## 2016-11-15 MED ORDER — CIPROFLOXACIN IN D5W 400 MG/200ML IV SOLN
400.0000 mg | INTRAVENOUS | Status: AC
  Administered 2016-11-15: 400 mg via INTRAVENOUS
  Filled 2016-11-15: qty 200

## 2016-11-15 MED ORDER — FENTANYL CITRATE (PF) 100 MCG/2ML IJ SOLN
INTRAMUSCULAR | Status: AC
Start: 2016-11-15 — End: 2016-11-15
  Filled 2016-11-15: qty 2

## 2016-11-15 MED ORDER — ONDANSETRON HCL 4 MG/2ML IJ SOLN
INTRAMUSCULAR | Status: DC | PRN
  Administered 2016-11-15: 4 mg via INTRAVENOUS

## 2016-11-15 MED ORDER — FENTANYL CITRATE (PF) 100 MCG/2ML IJ SOLN
25.0000 ug | INTRAMUSCULAR | Status: DC | PRN
  Filled 2016-11-15: qty 1

## 2016-11-15 MED ORDER — FENTANYL CITRATE (PF) 100 MCG/2ML IJ SOLN
INTRAMUSCULAR | Status: AC
  Filled 2016-11-15: qty 2

## 2016-11-15 MED ORDER — MEPERIDINE HCL 25 MG/ML IJ SOLN
6.2500 mg | INTRAMUSCULAR | Status: DC | PRN
  Filled 2016-11-15: qty 1

## 2016-11-15 MED ORDER — DEXAMETHASONE SODIUM PHOSPHATE 10 MG/ML IJ SOLN
INTRAMUSCULAR | Status: AC
  Filled 2016-11-15: qty 1

## 2016-11-15 MED ORDER — FENTANYL CITRATE (PF) 100 MCG/2ML IJ SOLN
50.0000 ug | Freq: Once | INTRAMUSCULAR | Status: AC
  Administered 2016-11-15: 50 ug via INTRAVENOUS
  Filled 2016-11-15: qty 1

## 2016-11-15 MED ORDER — LIDOCAINE 2% (20 MG/ML) 5 ML SYRINGE
INTRAMUSCULAR | Status: AC
  Filled 2016-11-15: qty 5

## 2016-11-15 MED ORDER — DEXAMETHASONE SODIUM PHOSPHATE 4 MG/ML IJ SOLN
INTRAMUSCULAR | Status: DC | PRN
  Administered 2016-11-15: 5 mg via INTRAVENOUS

## 2016-11-15 MED ORDER — LIDOCAINE 2% (20 MG/ML) 5 ML SYRINGE
INTRAMUSCULAR | Status: DC | PRN
  Administered 2016-11-15: 60 mg via INTRAVENOUS

## 2016-11-15 MED ORDER — ONDANSETRON HCL 4 MG/2ML IJ SOLN
INTRAMUSCULAR | Status: AC
  Filled 2016-11-15: qty 2

## 2016-11-15 MED ORDER — PROPOFOL 10 MG/ML IV BOLUS
INTRAVENOUS | Status: DC | PRN
  Administered 2016-11-15: 140 mg via INTRAVENOUS

## 2016-11-15 MED ORDER — ONDANSETRON HCL 4 MG/2ML IJ SOLN
4.0000 mg | Freq: Once | INTRAMUSCULAR | Status: DC | PRN
  Filled 2016-11-15: qty 2

## 2016-11-15 MED ORDER — KETOROLAC TROMETHAMINE 15 MG/ML IJ SOLN
INTRAMUSCULAR | Status: DC | PRN
  Administered 2016-11-15: 15 mg via INTRAVENOUS

## 2016-11-15 MED ORDER — CIPROFLOXACIN IN D5W 400 MG/200ML IV SOLN
INTRAVENOUS | Status: AC
  Filled 2016-11-15: qty 200

## 2016-11-15 MED ORDER — LACTATED RINGERS IV SOLN
INTRAVENOUS | Status: DC
  Administered 2016-11-15: 07:00:00 via INTRAVENOUS
  Administered 2016-11-15: 1000 mL via INTRAVENOUS
  Filled 2016-11-15: qty 1000

## 2016-11-15 MED ORDER — SODIUM CHLORIDE 0.9 % IJ SOLN
INTRAMUSCULAR | Status: DC | PRN
  Administered 2016-11-15: 10 mL

## 2016-11-15 MED ORDER — FENTANYL CITRATE (PF) 100 MCG/2ML IJ SOLN
INTRAMUSCULAR | Status: DC | PRN
  Administered 2016-11-15 (×2): 25 ug via INTRAVENOUS
  Administered 2016-11-15: 50 ug via INTRAVENOUS

## 2016-11-15 MED ORDER — TRAMADOL HCL 50 MG PO TABS: 50.0000 mg | ORAL_TABLET | Freq: Four times a day (QID) | ORAL | 0 refills | Status: DC | PRN

## 2016-11-15 SURGICAL SUPPLY — 17 items
COVER TABLE BACK 60X90 (DRAPES) ×2 IMPLANT
DRSG TEGADERM 4X4.75 (GAUZE/BANDAGES/DRESSINGS) ×1 IMPLANT
DRSG TEGADERM 8X12 (GAUZE/BANDAGES/DRESSINGS) ×1 IMPLANT
GAUZE SPONGE 4X4 8PLY STR LF (GAUZE/BANDAGES/DRESSINGS) ×1 IMPLANT
GLOVE BIO SURGEON STRL SZ7.5 (GLOVE) ×3 IMPLANT
GLOVE BIOGEL PI IND STRL 7.5 (GLOVE) IMPLANT
GLOVE BIOGEL PI INDICATOR 7.5 (GLOVE) ×2
GLOVE ECLIPSE 8.0 STRL XLNG CF (GLOVE) ×3 IMPLANT
GOWN STRL REUS W/TWL XL LVL3 (GOWN DISPOSABLE) ×4 IMPLANT
IMPL SPACEOAR SYSTEM 10ML (MISCELLANEOUS) IMPLANT
IMPLANT SPACEOAR SYSTEM 10ML (MISCELLANEOUS) ×3
KIT RM TURNOVER CYSTO AR (KITS) ×3 IMPLANT
MARKER GOLD PRELOAD 1.2X3 (Urological Implant) ×2 IMPLANT
PACK CYSTOSCOPY (CUSTOM PROCEDURE TRAY) ×1 IMPLANT
SEED GOLD PRELOAD 1.2X3 (Urological Implant) ×3 IMPLANT
SURGILUBE 2OZ TUBE FLIPTOP (MISCELLANEOUS) ×3 IMPLANT
UNDERPAD 30X30 INCONTINENT (UNDERPADS AND DIAPERS) ×3 IMPLANT

## 2016-11-15 NOTE — Anesthesia Postprocedure Evaluation (Signed)
Anesthesia Post Note  Patient: Steve Brown  Procedure(s) Performed: GOLD SEED IMPLANT TIMES THREE (N/A Prostate) SPACE OAR INSTILLATION (N/A Rectum)     Patient location during evaluation: PACU Anesthesia Type: General Level of consciousness: awake and alert and oriented Pain management: pain level controlled Vital Signs Assessment: post-procedure vital signs reviewed and stable Respiratory status: spontaneous breathing, nonlabored ventilation and respiratory function stable Cardiovascular status: blood pressure returned to baseline and stable Postop Assessment: no apparent nausea or vomiting Anesthetic complications: no    Last Vitals:  Vitals:   11/15/16 1030 11/15/16 1045  BP: (!) 161/88 (!) 166/87  Pulse: (!) 47 60  Resp: 15 14  Temp:    SpO2: 97% 93%    Last Pain:  Vitals:   11/15/16 1045  TempSrc:   PainSc: 5                  Naydene Kamrowski A.

## 2016-11-15 NOTE — Discharge Instructions (Signed)
DISCHARGE INSTRUCTIONS FOR PROSTATE MARKER PLACEMENT FOR RADIATION  Diet Resume your usual diet when you return home. To keep your bowels moving easily and softly, drink prune, apple and cranberry juice at room temperature. You may also take a stool softener, such as Colace, which is available without prescription at local pharmacies. Daily activities ? No driving or heavy lifting for at least two days after the implant. ? No bike riding, horseback riding or riding lawn mowers for the first month after the implant. ? Any strenuous physical activity should be approved by your doctor before you resume it. Sexual relations You may resume sexual relations two weeks after the procedure. Your semen may be dark brown or black; this is normal and is related bleeding that may have occurred during the implant. Postoperative swelling Expect swelling and bruising of the scrotum and perineum (the area between the scrotum and anus). Both the swelling and the bruising should resolve in l or 2 weeks. Ice packs and over- the-counter medications such as Tylenol, Advil or Aleve may lessen your discomfort. Postoperative urination Most men experience burning on urination and/or urinary frequency. If this becomes bothersome, contact your Urologist.  Medication can be prescribed to relieve these problems.  It is normal to have some blood in your urine for a few days after the implant. Contact your doctor for ? Temperature greater than 101 F ? Increasing pain ? Inability to urinate You should have follow up with your urologist and radiation oncologist about 3 weeks after the procedure. ? Do not take any nonsteroidal anti inflammatories (Advil, Motrin, Ibuprofen, aleve) until after 3:30 pm.    Post Anesthesia Home Care Instructions  Activity: Get plenty of rest for the remainder of the day. A responsible individual must stay with you for 24 hours following the procedure.  For the next 24 hours, DO NOT: -Drive a  car -Paediatric nurse -Drink alcoholic beverages -Take any medication unless instructed by your physician -Make any legal decisions or sign important papers.  Meals: Start with liquid foods such as gelatin or soup. Progress to regular foods as tolerated. Avoid greasy, spicy, heavy foods. If nausea and/or vomiting occur, drink only clear liquids until the nausea and/or vomiting subsides. Call your physician if vomiting continues.  Special Instructions/Symptoms: Your throat may feel dry or sore from the anesthesia or the breathing tube placed in your throat during surgery. If this causes discomfort, gargle with warm salt water. The discomfort should disappear within 24 hours.  If you had a scopolamine patch placed behind your ear for the management of post- operative nausea and/or vomiting:  1. The medication in the patch is effective for 72 hours, after which it should be removed.  Wrap patch in a tissue and discard in the trash. Wash hands thoroughly with soap and water. 2. You may remove the patch earlier than 72 hours if you experience unpleasant side effects which may include dry mouth, dizziness or visual disturbances. 3. Avoid touching the patch. Wash your hands with soap and water after contact with the patch.

## 2016-11-15 NOTE — Op Note (Signed)
Preoperative diagnosis:  1. Prostate cancer  Postoperative diagnosis:  1. Same  Procedure: 1. Gold seed fiducial marker placement 2. Injection of space OAR Biogel  Surgeon: Ardis Hughs, MD  Anesthesia: General  Complications: None  Intraoperative findings:  Fiducial markers placed in the routine location transperitoneally under direct transrectal ultrasound guidance  EBL: Minimal  Specimens: None  Indication: Steve Brown is a 76 y.o. patient with intermediate risk prostate cancer.  We discussed treatment options and the patient opted to proceed with external beam radiation therapy.  As part of his treatment the patient needed fiducial marker placement.  We have also discussed the use of Space OAR Biogel which the patient has agreed to proceed with after discussing all the risks and the associated benefits..  After reviewing the management options for treatment, he elected to proceed with the above surgical procedure(s). We have discussed the potential benefits and risks of the procedure, side effects of the proposed treatment, the likelihood of the patient achieving the goals of the procedure, and any potential problems that might occur during the procedure or recuperation. Informed consent has been obtained.  Description of procedure:  The patient was taken to the operating room and general anesthesia was induced.  The patient was placed in the dorsal lithotomy position, prepped and draped in the usual sterile fashion, and preoperative antibiotics were administered. A preoperative time-out was performed.   The transrectal ultrasound was placed using the stepper.  Using ultrasound guidance I then placed 3 gold seed fiducial markers.  These were placed at the prostate base just right of midline, the prostate apex just right of midline, and far left mid gland.  Next, the space OAR Biogel was mixed and prepared as directed.  I then used a 18-gauge beveled spinal needle and  under ultrasound guidance placed the tip of the needle into the plane between the prostate and the rectum right at the midline and mid gland.  I injected approximately 1 cc of saline to ensure good position and distribution of the fluid.  I then injected a total of 10 cc of the Space OAR into the potential space between the prostate and the rectum under visual guidance.  Moving the ultrasound between sagittal and transverse images the placement was noted to be excellent.  The needle was then retracted/removed and the ultrasound removed.  The patient was subsequently awoken and returned the PACU in stable condition.    Ardis Hughs, M.D.

## 2016-11-15 NOTE — Anesthesia Procedure Notes (Signed)
Procedure Name: LMA Insertion Date/Time: 11/15/2016 8:41 AM Performed by: Justice Rocher, CRNA Pre-anesthesia Checklist: Patient identified, Emergency Drugs available, Suction available and Patient being monitored Patient Re-evaluated:Patient Re-evaluated prior to induction Oxygen Delivery Method: Circle system utilized Preoxygenation: Pre-oxygenation with 100% oxygen Induction Type: IV induction Ventilation: Mask ventilation without difficulty LMA: LMA inserted LMA Size: 5.0 Number of attempts: 1 Airway Equipment and Method: Bite block Placement Confirmation: positive ETCO2 and breath sounds checked- equal and bilateral Tube secured with: Tape Dental Injury: Teeth and Oropharynx as per pre-operative assessment

## 2016-11-15 NOTE — Transfer of Care (Signed)
Immediate Anesthesia Transfer of Care Note  Patient: Steve Brown  Procedure(s) Performed: Procedure(s) (LRB): GOLD SEED IMPLANT (N/A) SPACE OAR INSTILLATION (N/A)  Patient Location: PACU  Anesthesia Type: General  Level of Consciousness: awake, sedated, patient cooperative and responds to stimulation  Airway & Oxygen Therapy: Patient Spontanous Breathing and Patient connected to [place] oxygen  Post-op Assessment: Report given to PACU RN, Post -op Vital signs reviewed and stable and Patient moving all extremities  Post vital signs: Reviewed and stable  Complications: No apparent anesthesia complications

## 2016-11-15 NOTE — H&P (Signed)
f/u to monitor Prostate Cancer  HPI: Steve Brown is a 76 year-old male established patient who is here for interval evaluation of his prostate cancer.  The patient was last seen 2 weeks prior.   He was diagnosed with prostate cancer in approximately 08/31/2016. The patient's gleason score was: 3+4=7. Pretreatment PSA: 5.57.   Pre-treatment SHIM: 15.   Returns today for discussion of his biopsy results  3+4=7 in 4/12 cores in right apex and right mid, and left mid-lateral.  Vol 38gms, no median lobe.   IPSS: 4   Intv: The patient presents today for follow-up of his prostate cancer diagnosis. He recovered from his prostate biopsy without any issues. He doesn't have any ongoing bleeding or dysuria.     ALLERGIES: Phenergan - Other Reaction, "drunk" feeling    MEDICATIONS: Dutasteride 0.5 mg capsule 1 capsule PO Daily  Tamsulosin Hcl 0.4 mg capsule, ext release 24 hr 1 capsule PO Daily  Crestor 10 MG Oral Tablet 1 Oral Daily  Furosemide 20 MG Oral Tablet Oral  Humalog 100 unit/ml vial Subcutaneous  Lyrica 300 MG Oral Capsule Oral  Nitrostat 0.4 mg tablet, sublingual Sublingual  Ramipril 2.5 mg capsule Oral  Valacyclovir 500 mg tablet Oral  Vitamin D3 1,000 unit tablet Oral  Voltaren 1 % gel External  Xarelto 20 mg tablet Oral     GU PSH: Cystoscopy - 05/09/2016 Prostate Needle Biopsy - 08/31/2016      PSH Notes: Lung Surgery, Neck Surgery, Decompression Of Median Nerve At Carpal Tunnel   NON-GU PSH: Carpal Tunnel Surgery.. - 2009 Lung Surgery (Unspecified) - 2009    GU PMH: Urinary Frequency (Stable) - 09/01/2016, Urinary frequency, - 2017 Elevated PSA - 06/20/2016, - 09/07/2015, - 08/03/2015, Elevated prostate specific antigen (PSA), - 2016 Nocturia (Stable) - 06/20/2016, Nocturia, - 2015 Acute prostatitis - 05/09/2016 Microscopic hematuria - 05/09/2016 Gross hematuria, The patient's gross hematuria is likely secondary to a urinary tract infection. I did: Bactrim today as well  as into urine culture. We will plan to follow-up the urine culture to ensure that he is on the appropriate agent. The patient is planned to follow up with me in July with a PSA prior. We'll keep that appointment. - 03/27/2016, Gross hematuria, - 2015, Gross Hematuria, - 2014 Dysuria, Dysuria - 05/12/2015 Urinary Tract Inf, Unspec site, Suspected urinary tract infection - 05/12/2015, Urinary tract infection, - 2014, Pyuria, - 2014 Low back pain, Lumbago - 2016 BPH w/LUTS, Benign localized prostatic hyperplasia with lower urinary tract symptoms (LUTS) - 2015 BPH w/o LUTS, Benign prostatic hypertrophy without lower urinary tract symptoms - 2015 ED due to arterial insufficiency, Erectile dysfunction due to arterial insufficiency - 2014 Encounter for Prostate Cancer screening, Prostate cancer screening - 2014 Epididymitis, Epididymitis Right - 2014 Hydrocele, Unspec, Hydrocele, bilateral - 2014, Hydrocele, right, - 2014 Obstructive and reflux uropathy, Unspec, Obstructive uropathy - 2014 Other microscopic hematuria, Microscopic Hematuria - 2014 Personal Hx Oth Urinary System diseases, History of urinary tract obstruction - 2014 Renal cyst, Renal cyst, acquired - 2014      PMH Notes:  2007-11-11 13:17:01 - Note: Pneumonia  2011-07-04 14:11:38 - Note: Scrotal Pain   NON-GU PMH: Glycosuria, Glycosuria - 2017 Personal history of other specified conditions, History of brain tumor - 2017 Trichomoniasis, unspecified, Trichomoniasis - 2016 Encounter for general adult medical examination without abnormal findings, Encounter for preventive health examination - 2016 Arrhythmia, Rhythm Disorder - 2014 Decreased libido, Decreased libido - 2014 Gastric ulcer, unspecified as acute or chronic, without  hemorrhage or perforation, Gastric Ulcer - 2014 Personal history of other diseases of the nervous system and sense organs, History of sleep apnea - 2014 Personal history of other endocrine, nutritional and metabolic  disease, History of hypercholesterolemia - 2014, History of diabetes mellitus, - 2014 Personal history of transient ischemic attack (TIA), and cerebral infarction without residual deficits, History of transient cerebral ischemia - 2014    FAMILY HISTORY: Cerebral Artery Aneurysm - Mother Death In The Family Father - Runs In Family Death In The Family Mother - Runs In Family Family Health Status Number - Runs In Family   SOCIAL HISTORY: Marital Status: Married Preferred Language: English; Ethnicity: Not Hispanic Or Latino; Race: Black or African American Current Smoking Status: Patient has never smoked.   Tobacco Use Assessment Completed: Used Tobacco in last 30 days? Has never drank.  Drinks 3 caffeinated drinks per day.     Notes: Never A Smoker, Marital History - Divorced, Retired From Work, Alcohol Use, Caffeine Use   REVIEW OF SYSTEMS:    GU Review Male:   Patient denies frequent urination, hard to postpone urination, burning/ pain with urination, get up at night to urinate, leakage of urine, stream starts and stops, trouble starting your stream, have to strain to urinate , erection problems, and penile pain.  Gastrointestinal (Upper):   Patient denies nausea, vomiting, and indigestion/ heartburn.  Gastrointestinal (Lower):   Patient denies diarrhea and constipation.  Constitutional:   Patient denies fever, night sweats, weight loss, and fatigue.  Skin:   Patient denies skin rash/ lesion and itching.  Eyes:   Patient denies blurred vision and double vision.  Ears/ Nose/ Throat:   Patient denies sore throat and sinus problems.  Hematologic/Lymphatic:   Patient denies swollen glands and easy bruising.  Cardiovascular:   Patient denies leg swelling and chest pains.  Respiratory:   Patient denies cough and shortness of breath.  Endocrine:   Patient denies excessive thirst.  Musculoskeletal:   Patient denies back pain and joint pain.  Neurological:   Patient denies headaches and  dizziness.  Psychologic:   Patient denies depression and anxiety.   VITAL SIGNS: None   MULTI-SYSTEM PHYSICAL EXAMINATION:    Constitutional: Well-nourished. No physical deformities. Normally developed. Good grooming.  Neck: Neck symmetrical, not swollen. Normal tracheal position.  Respiratory: No labored breathing, no use of accessory muscles.   Cardiovascular: Normal temperature, normal extremity pulses, no swelling, no varicosities.  Lymphatic: No enlargement of neck, axillae, groin.  Skin: No paleness, no jaundice, no cyanosis. No lesion, no ulcer, no rash.  Neurologic / Psychiatric: Oriented to time, oriented to place, oriented to person. No depression, no anxiety, no agitation.  Gastrointestinal: No mass, no tenderness, no rigidity, non obese abdomen.  Eyes: Normal conjunctivae. Normal eyelids.  Ears, Nose, Mouth, and Throat: Left ear no scars, no lesions, no masses. Right ear no scars, no lesions, no masses. Nose no scars, no lesions, no masses. Normal hearing. Normal lips.  Musculoskeletal: Normal gait and station of head and neck.     PAST DATA REVIEWED:  Source Of History:  Patient  Lab Test Review:   PSA  Records Review:   Pathology Reports, Previous Patient Records   06/12/16 07/27/15 06/19/14 06/28/13 01/06/13 05/23/12 10/16/11 07/05/11  PSA  Total PSA 5.57 ng/dl 4.37  3.63  3.74  3.44  3.12  3.52  2.99   Free PSA 0.56 ng/dl         % Free PSA 10 %  11/24/08 11/19/08  Hormones  Testosterone, Total 438.0  433.0     PROCEDURES:          Urinalysis w/Scope Dipstick Dipstick Cont'd Micro  Color: Yellow Bilirubin: Neg WBC/hpf: NS (Not Seen)  Appearance: Cloudy Ketones: Neg RBC/hpf: >60/hpf  Specific Gravity: 1.015 Blood: 3+ Bacteria: NS (Not Seen)  pH: 5.5 Protein: Neg Cystals: NS (Not Seen)  Glucose: Neg Urobilinogen: 0.2 Casts: NS (Not Seen)    Nitrites: Neg Trichomonas: Not Present    Leukocyte Esterase: Neg Mucous: Not Present      Epithelial Cells: 0  - 5/hpf      Yeast: NS (Not Seen)      Sperm: Not Present    ASSESSMENT:      ICD-10 Details  1 GU:   History of prostate cancer - Z85.46    PLAN:           Document Letter(s):  Created for Patient: Clinical Summary    The patient was counseled about the natural history of prostate cancer and the standard treatment options that are available for prostate cancer. It was explained to him how his age and life expectancy, clinical stage, Gleason score, and PSA affect his prognosis, the decision to proceed with additional staging studies, as well as how that information influences recommended treatment strategies. We discussed the roles for active surveillance, radiation therapy, surgical therapy, androgen deprivation, as well as ablative therapy options for the treatment of prostate cancer as appropriate to his individual cancer situation. We discussed the risks and benefits of these options with regard to their impact on cancer control and also in terms of potential adverse events, complications, and impact on quality of life particularly related to urinary, bowel, and sexual function. The patient was encouraged to ask questions throughout the discussion today and all questions were answered to his stated satisfaction. In addition, the patient was provided with and/or directed to appropriate resources and literature for further education about prostate cancer and treatment options.   35 minutes were spent in face to face consultation with patient today.          Notes:   I recommended the patient discusses radiation treatment options with Dr. Tammi Klippel. He may be a good candidate for brachytherapy. We also discussed external beam radiation therapy in detail. I did tell him about surgical extirpation as well, but think he be best served with radiation. We also briefly discussed potential for active surveillance. He does understand that with active surveillance he will need repeat biopsies, which he  did tolerate quite well this time.

## 2016-11-15 NOTE — Interval H&P Note (Signed)
History and Physical Interval Note:  11/15/2016 8:41 AM  Steve Brown  has presented today for surgery, with the diagnosis of PROSTATE CANCER  The various methods of treatment have been discussed with the patient and family. After consideration of risks, benefits and other options for treatment, the patient has consented to  Procedure(s): GOLD SEED IMPLANT (N/A) SPACE OAR INSTILLATION (N/A) as a surgical intervention .  The patient's history has been reviewed, patient examined, no change in status, stable for surgery.  I have reviewed the patient's chart and labs.  Questions were answered to the patient's satisfaction.     Louis Meckel W

## 2016-11-16 ENCOUNTER — Encounter (HOSPITAL_BASED_OUTPATIENT_CLINIC_OR_DEPARTMENT_OTHER): Payer: Self-pay | Admitting: Urology

## 2016-11-16 DIAGNOSIS — H532 Diplopia: Secondary | ICD-10-CM | POA: Diagnosis not present

## 2016-11-16 DIAGNOSIS — H00016 Hordeolum externum left eye, unspecified eyelid: Secondary | ICD-10-CM | POA: Diagnosis not present

## 2016-11-16 DIAGNOSIS — I69359 Hemiplegia and hemiparesis following cerebral infarction affecting unspecified side: Secondary | ICD-10-CM | POA: Diagnosis not present

## 2016-11-16 DIAGNOSIS — R531 Weakness: Secondary | ICD-10-CM | POA: Diagnosis not present

## 2016-11-17 ENCOUNTER — Ambulatory Visit
Admission: RE | Admit: 2016-11-17 | Discharge: 2016-11-17 | Disposition: A | Discharge status: Home / Self Care | Payer: Medicare Other | Source: Ambulatory Visit | Attending: Radiation Oncology | Admitting: Radiation Oncology

## 2016-11-17 DIAGNOSIS — Z510 Encounter for antineoplastic radiation therapy: Secondary | ICD-10-CM | POA: Diagnosis not present

## 2016-11-17 DIAGNOSIS — I129 Hypertensive chronic kidney disease with stage 1 through stage 4 chronic kidney disease, or unspecified chronic kidney disease: Secondary | ICD-10-CM | POA: Diagnosis not present

## 2016-11-17 DIAGNOSIS — Z8042 Family history of malignant neoplasm of prostate: Secondary | ICD-10-CM | POA: Diagnosis not present

## 2016-11-17 DIAGNOSIS — C61 Malignant neoplasm of prostate: Secondary | ICD-10-CM

## 2016-11-17 DIAGNOSIS — N182 Chronic kidney disease, stage 2 (mild): Secondary | ICD-10-CM | POA: Diagnosis not present

## 2016-11-17 DIAGNOSIS — E1122 Type 2 diabetes mellitus with diabetic chronic kidney disease: Secondary | ICD-10-CM | POA: Diagnosis not present

## 2016-11-19 NOTE — Progress Notes (Signed)
  Radiation Oncology         [phone]) [phone] ________________________________  Name: Steve Brown MRN: [id number]  Date: 11/17/2016  DOB: April 29, 1940  SIMULATION AND TREATMENT PLANNING NOTE    ICD-10-CM   1. Malignant neoplasm of prostate ([place]) C61     DIAGNOSIS:  76 y.o. gentleman with Stage T1c adenocarcinoma of the prostate with Gleason Score of 3+4, and PSA of 5.57  NARRATIVE:  The patient was brought to the [facility].  Identity was confirmed.  All relevant records and images related to the planned course of therapy were reviewed.  The patient freely provided informed written consent to proceed with treatment after reviewing the details related to the planned course of therapy. The consent form was witnessed and verified by the simulation staff.  Then, the patient was set-up in a stable reproducible supine position for radiation therapy.  A vacuum lock pillow device was custom fabricated to position his legs in a reproducible immobilized position.  Then, I performed a urethrogram under sterile conditions to identify the prostatic apex.  CT images were obtained.  Surface markings were placed.  The CT images were loaded into the planning software.  MRI to be done for delineation of the prostate from the SpaceOAR gel  Then the prostate target and avoidance structures including the rectum, bladder, bowel and hips were contoured.  Treatment planning then occurred.  The radiation prescription was entered and confirmed.  A total of one complex treatment devices was fabricated. I have requested : Intensity Modulated Radiotherapy (IMRT) is medically necessary for this case for the following reason:  Rectal sparing.  PLAN:  The patient will receive 78 Gy in 40 fractions.  ________________________________  Sheral Apley Tammi Klippel, M.D.

## 2016-11-20 ENCOUNTER — Encounter (HOSPITAL_COMMUNITY): Payer: Self-pay

## 2016-11-20 ENCOUNTER — Ambulatory Visit (HOSPITAL_COMMUNITY)
Admission: RE | Admit: 2016-11-20 | Discharge: 2016-11-20 | Disposition: A | Discharge status: Home / Self Care | Payer: Medicare Other | Source: Ambulatory Visit | Attending: Urology | Admitting: Urology

## 2016-11-20 DIAGNOSIS — C61 Malignant neoplasm of prostate: Secondary | ICD-10-CM

## 2016-11-21 DIAGNOSIS — H0014 Chalazion left upper eyelid: Secondary | ICD-10-CM | POA: Diagnosis not present

## 2016-11-22 DIAGNOSIS — Z8042 Family history of malignant neoplasm of prostate: Secondary | ICD-10-CM | POA: Diagnosis not present

## 2016-11-22 DIAGNOSIS — Z510 Encounter for antineoplastic radiation therapy: Secondary | ICD-10-CM | POA: Diagnosis not present

## 2016-11-22 DIAGNOSIS — N182 Chronic kidney disease, stage 2 (mild): Secondary | ICD-10-CM | POA: Diagnosis not present

## 2016-11-22 DIAGNOSIS — I129 Hypertensive chronic kidney disease with stage 1 through stage 4 chronic kidney disease, or unspecified chronic kidney disease: Secondary | ICD-10-CM | POA: Diagnosis not present

## 2016-11-22 DIAGNOSIS — C61 Malignant neoplasm of prostate: Secondary | ICD-10-CM | POA: Diagnosis not present

## 2016-11-22 DIAGNOSIS — E1122 Type 2 diabetes mellitus with diabetic chronic kidney disease: Secondary | ICD-10-CM | POA: Diagnosis not present

## 2016-11-27 ENCOUNTER — Ambulatory Visit
Admission: RE | Admit: 2016-11-27 | Discharge: 2016-11-27 | Disposition: A | Discharge status: Home / Self Care | Payer: Medicare Other | Source: Ambulatory Visit | Attending: Radiation Oncology | Admitting: Radiation Oncology

## 2016-11-27 DIAGNOSIS — E1122 Type 2 diabetes mellitus with diabetic chronic kidney disease: Secondary | ICD-10-CM | POA: Diagnosis not present

## 2016-11-27 DIAGNOSIS — N182 Chronic kidney disease, stage 2 (mild): Secondary | ICD-10-CM | POA: Diagnosis not present

## 2016-11-27 DIAGNOSIS — Z510 Encounter for antineoplastic radiation therapy: Secondary | ICD-10-CM | POA: Diagnosis not present

## 2016-11-27 DIAGNOSIS — Z8042 Family history of malignant neoplasm of prostate: Secondary | ICD-10-CM | POA: Diagnosis not present

## 2016-11-27 DIAGNOSIS — I129 Hypertensive chronic kidney disease with stage 1 through stage 4 chronic kidney disease, or unspecified chronic kidney disease: Secondary | ICD-10-CM | POA: Diagnosis not present

## 2016-11-27 DIAGNOSIS — C61 Malignant neoplasm of prostate: Secondary | ICD-10-CM | POA: Diagnosis not present

## 2016-11-28 ENCOUNTER — Ambulatory Visit
Admission: RE | Admit: 2016-11-28 | Discharge: 2016-11-28 | Disposition: A | Discharge status: Home / Self Care | Payer: Medicare Other | Source: Ambulatory Visit | Attending: Radiation Oncology | Admitting: Radiation Oncology

## 2016-11-28 DIAGNOSIS — I129 Hypertensive chronic kidney disease with stage 1 through stage 4 chronic kidney disease, or unspecified chronic kidney disease: Secondary | ICD-10-CM | POA: Diagnosis not present

## 2016-11-28 DIAGNOSIS — E1122 Type 2 diabetes mellitus with diabetic chronic kidney disease: Secondary | ICD-10-CM | POA: Diagnosis not present

## 2016-11-28 DIAGNOSIS — C61 Malignant neoplasm of prostate: Secondary | ICD-10-CM | POA: Diagnosis not present

## 2016-11-28 DIAGNOSIS — N182 Chronic kidney disease, stage 2 (mild): Secondary | ICD-10-CM | POA: Diagnosis not present

## 2016-11-28 DIAGNOSIS — Z510 Encounter for antineoplastic radiation therapy: Secondary | ICD-10-CM | POA: Diagnosis not present

## 2016-11-28 DIAGNOSIS — Z8042 Family history of malignant neoplasm of prostate: Secondary | ICD-10-CM | POA: Diagnosis not present

## 2016-11-29 ENCOUNTER — Ambulatory Visit
Admission: RE | Admit: 2016-11-29 | Discharge: 2016-11-29 | Disposition: A | Discharge status: Home / Self Care | Payer: Medicare Other | Source: Ambulatory Visit | Attending: Radiation Oncology | Admitting: Radiation Oncology

## 2016-11-29 DIAGNOSIS — N182 Chronic kidney disease, stage 2 (mild): Secondary | ICD-10-CM | POA: Diagnosis not present

## 2016-11-29 DIAGNOSIS — Z510 Encounter for antineoplastic radiation therapy: Secondary | ICD-10-CM | POA: Diagnosis not present

## 2016-11-29 DIAGNOSIS — E1122 Type 2 diabetes mellitus with diabetic chronic kidney disease: Secondary | ICD-10-CM | POA: Diagnosis not present

## 2016-11-29 DIAGNOSIS — C61 Malignant neoplasm of prostate: Secondary | ICD-10-CM | POA: Diagnosis not present

## 2016-11-29 DIAGNOSIS — Z8042 Family history of malignant neoplasm of prostate: Secondary | ICD-10-CM | POA: Diagnosis not present

## 2016-11-29 DIAGNOSIS — I129 Hypertensive chronic kidney disease with stage 1 through stage 4 chronic kidney disease, or unspecified chronic kidney disease: Secondary | ICD-10-CM | POA: Diagnosis not present

## 2016-12-04 ENCOUNTER — Ambulatory Visit
Admission: RE | Admit: 2016-12-04 | Discharge: 2016-12-04 | Disposition: A | Discharge status: Home / Self Care | Payer: Medicare Other | Source: Ambulatory Visit | Attending: Radiation Oncology | Admitting: Radiation Oncology

## 2016-12-04 DIAGNOSIS — I129 Hypertensive chronic kidney disease with stage 1 through stage 4 chronic kidney disease, or unspecified chronic kidney disease: Secondary | ICD-10-CM | POA: Diagnosis not present

## 2016-12-04 DIAGNOSIS — Z8042 Family history of malignant neoplasm of prostate: Secondary | ICD-10-CM | POA: Diagnosis not present

## 2016-12-04 DIAGNOSIS — N182 Chronic kidney disease, stage 2 (mild): Secondary | ICD-10-CM | POA: Diagnosis not present

## 2016-12-04 DIAGNOSIS — E1122 Type 2 diabetes mellitus with diabetic chronic kidney disease: Secondary | ICD-10-CM | POA: Diagnosis not present

## 2016-12-04 DIAGNOSIS — Z510 Encounter for antineoplastic radiation therapy: Secondary | ICD-10-CM | POA: Diagnosis not present

## 2016-12-04 DIAGNOSIS — C61 Malignant neoplasm of prostate: Secondary | ICD-10-CM | POA: Diagnosis not present

## 2016-12-05 ENCOUNTER — Ambulatory Visit
Admission: RE | Admit: 2016-12-05 | Discharge: 2016-12-05 | Disposition: A | Discharge status: Home / Self Care | Payer: Medicare Other | Source: Ambulatory Visit | Attending: Radiation Oncology | Admitting: Radiation Oncology

## 2016-12-05 DIAGNOSIS — E1122 Type 2 diabetes mellitus with diabetic chronic kidney disease: Secondary | ICD-10-CM | POA: Diagnosis not present

## 2016-12-05 DIAGNOSIS — I129 Hypertensive chronic kidney disease with stage 1 through stage 4 chronic kidney disease, or unspecified chronic kidney disease: Secondary | ICD-10-CM | POA: Diagnosis not present

## 2016-12-05 DIAGNOSIS — Z510 Encounter for antineoplastic radiation therapy: Secondary | ICD-10-CM | POA: Diagnosis not present

## 2016-12-05 DIAGNOSIS — N182 Chronic kidney disease, stage 2 (mild): Secondary | ICD-10-CM | POA: Diagnosis not present

## 2016-12-05 DIAGNOSIS — Z8042 Family history of malignant neoplasm of prostate: Secondary | ICD-10-CM | POA: Diagnosis not present

## 2016-12-05 DIAGNOSIS — C61 Malignant neoplasm of prostate: Secondary | ICD-10-CM | POA: Diagnosis not present

## 2016-12-06 ENCOUNTER — Ambulatory Visit
Admission: RE | Admit: 2016-12-06 | Discharge: 2016-12-06 | Disposition: A | Discharge status: Home / Self Care | Payer: Medicare Other | Source: Ambulatory Visit | Attending: Radiation Oncology | Admitting: Radiation Oncology

## 2016-12-06 DIAGNOSIS — I129 Hypertensive chronic kidney disease with stage 1 through stage 4 chronic kidney disease, or unspecified chronic kidney disease: Secondary | ICD-10-CM | POA: Diagnosis not present

## 2016-12-06 DIAGNOSIS — N182 Chronic kidney disease, stage 2 (mild): Secondary | ICD-10-CM | POA: Diagnosis not present

## 2016-12-06 DIAGNOSIS — Z8042 Family history of malignant neoplasm of prostate: Secondary | ICD-10-CM | POA: Diagnosis not present

## 2016-12-06 DIAGNOSIS — C61 Malignant neoplasm of prostate: Secondary | ICD-10-CM | POA: Diagnosis not present

## 2016-12-06 DIAGNOSIS — Z510 Encounter for antineoplastic radiation therapy: Secondary | ICD-10-CM | POA: Diagnosis not present

## 2016-12-06 DIAGNOSIS — E1122 Type 2 diabetes mellitus with diabetic chronic kidney disease: Secondary | ICD-10-CM | POA: Diagnosis not present

## 2016-12-07 ENCOUNTER — Ambulatory Visit
Admission: RE | Admit: 2016-12-07 | Discharge: 2016-12-07 | Disposition: A | Discharge status: Home / Self Care | Payer: Medicare Other | Source: Ambulatory Visit | Attending: Radiation Oncology | Admitting: Radiation Oncology

## 2016-12-07 DIAGNOSIS — E1122 Type 2 diabetes mellitus with diabetic chronic kidney disease: Secondary | ICD-10-CM | POA: Diagnosis not present

## 2016-12-07 DIAGNOSIS — N182 Chronic kidney disease, stage 2 (mild): Secondary | ICD-10-CM | POA: Diagnosis not present

## 2016-12-07 DIAGNOSIS — Z8042 Family history of malignant neoplasm of prostate: Secondary | ICD-10-CM | POA: Diagnosis not present

## 2016-12-07 DIAGNOSIS — I129 Hypertensive chronic kidney disease with stage 1 through stage 4 chronic kidney disease, or unspecified chronic kidney disease: Secondary | ICD-10-CM | POA: Diagnosis not present

## 2016-12-07 DIAGNOSIS — Z510 Encounter for antineoplastic radiation therapy: Secondary | ICD-10-CM | POA: Diagnosis not present

## 2016-12-07 DIAGNOSIS — C61 Malignant neoplasm of prostate: Secondary | ICD-10-CM | POA: Diagnosis not present

## 2016-12-08 ENCOUNTER — Ambulatory Visit
Admission: RE | Admit: 2016-12-08 | Discharge: 2016-12-08 | Disposition: A | Discharge status: Home / Self Care | Payer: Medicare Other | Source: Ambulatory Visit | Attending: Radiation Oncology | Admitting: Radiation Oncology

## 2016-12-08 ENCOUNTER — Other Ambulatory Visit: Payer: Self-pay | Admitting: Radiation Oncology

## 2016-12-08 DIAGNOSIS — Z510 Encounter for antineoplastic radiation therapy: Secondary | ICD-10-CM | POA: Diagnosis not present

## 2016-12-08 DIAGNOSIS — R300 Dysuria: Secondary | ICD-10-CM

## 2016-12-08 DIAGNOSIS — Z8042 Family history of malignant neoplasm of prostate: Secondary | ICD-10-CM | POA: Diagnosis not present

## 2016-12-08 DIAGNOSIS — I129 Hypertensive chronic kidney disease with stage 1 through stage 4 chronic kidney disease, or unspecified chronic kidney disease: Secondary | ICD-10-CM | POA: Diagnosis not present

## 2016-12-08 DIAGNOSIS — C61 Malignant neoplasm of prostate: Secondary | ICD-10-CM | POA: Diagnosis not present

## 2016-12-08 DIAGNOSIS — E1122 Type 2 diabetes mellitus with diabetic chronic kidney disease: Secondary | ICD-10-CM | POA: Diagnosis not present

## 2016-12-08 DIAGNOSIS — N182 Chronic kidney disease, stage 2 (mild): Secondary | ICD-10-CM | POA: Diagnosis not present

## 2016-12-08 LAB — URINALYSIS, MICROSCOPIC - CHCC
Bilirubin (Urine): NEGATIVE
Glucose: NEGATIVE mg/dL
Ketones: NEGATIVE mg/dL
Nitrite: NEGATIVE
Protein: 30 mg/dL
Specific Gravity, Urine: 1.015 (ref 1.003–1.035)
Urobilinogen, UR: 0.2 mg/dL (ref 0.2–1)
pH: 7 (ref 4.6–8.0)

## 2016-12-08 NOTE — Progress Notes (Signed)
Please call patient with normal result.  No antibiotic needed, we will await culture, may want to consider AZO  Thanks. MM

## 2016-12-10 LAB — URINE CULTURE

## 2016-12-11 ENCOUNTER — Ambulatory Visit
Admission: RE | Admit: 2016-12-11 | Discharge: 2016-12-11 | Disposition: A | Discharge status: Home / Self Care | Payer: Medicare Other | Source: Ambulatory Visit | Attending: Radiation Oncology | Admitting: Radiation Oncology

## 2016-12-11 ENCOUNTER — Telehealth: Payer: Self-pay | Admitting: Radiation Oncology

## 2016-12-11 DIAGNOSIS — N182 Chronic kidney disease, stage 2 (mild): Secondary | ICD-10-CM | POA: Diagnosis not present

## 2016-12-11 DIAGNOSIS — C61 Malignant neoplasm of prostate: Secondary | ICD-10-CM | POA: Diagnosis not present

## 2016-12-11 DIAGNOSIS — Z510 Encounter for antineoplastic radiation therapy: Secondary | ICD-10-CM | POA: Diagnosis not present

## 2016-12-11 DIAGNOSIS — I129 Hypertensive chronic kidney disease with stage 1 through stage 4 chronic kidney disease, or unspecified chronic kidney disease: Secondary | ICD-10-CM | POA: Diagnosis not present

## 2016-12-11 DIAGNOSIS — E1122 Type 2 diabetes mellitus with diabetic chronic kidney disease: Secondary | ICD-10-CM | POA: Diagnosis not present

## 2016-12-11 DIAGNOSIS — Z8042 Family history of malignant neoplasm of prostate: Secondary | ICD-10-CM | POA: Diagnosis not present

## 2016-12-11 MED ORDER — NITROFURANTOIN MONOHYD MACRO 100 MG PO CAPS: 100.0000 mg | ORAL_CAPSULE | Freq: Two times a day (BID) | ORAL | 0 refills | Status: AC

## 2016-12-11 NOTE — Telephone Encounter (Signed)
Phoned patient at home as requested by Dr. Tammi Klippel. Informed the patient his urine culture confirmed an infection thus, Macrobid was escribed to [facility] on [address]. Reviewed use and stress he should take the medication until its completely gone. Patient verbalized understanding and expressed appreciation for the call.

## 2016-12-11 NOTE — Telephone Encounter (Signed)
-----   Message from Tyler Pita, MD sent at 12/11/2016  8:09 AM EST ----- Please call patient.  Sent [facility] to [facility]

## 2016-12-12 ENCOUNTER — Ambulatory Visit
Admission: RE | Admit: 2016-12-12 | Discharge: 2016-12-12 | Disposition: A | Discharge status: Home / Self Care | Payer: Medicare Other | Source: Ambulatory Visit | Attending: Radiation Oncology | Admitting: Radiation Oncology

## 2016-12-12 DIAGNOSIS — Z8042 Family history of malignant neoplasm of prostate: Secondary | ICD-10-CM | POA: Diagnosis not present

## 2016-12-12 DIAGNOSIS — E1122 Type 2 diabetes mellitus with diabetic chronic kidney disease: Secondary | ICD-10-CM | POA: Diagnosis not present

## 2016-12-12 DIAGNOSIS — C61 Malignant neoplasm of prostate: Secondary | ICD-10-CM | POA: Diagnosis not present

## 2016-12-12 DIAGNOSIS — N182 Chronic kidney disease, stage 2 (mild): Secondary | ICD-10-CM | POA: Diagnosis not present

## 2016-12-12 DIAGNOSIS — E119 Type 2 diabetes mellitus without complications: Secondary | ICD-10-CM | POA: Diagnosis not present

## 2016-12-12 DIAGNOSIS — Z510 Encounter for antineoplastic radiation therapy: Secondary | ICD-10-CM | POA: Diagnosis not present

## 2016-12-12 DIAGNOSIS — I129 Hypertensive chronic kidney disease with stage 1 through stage 4 chronic kidney disease, or unspecified chronic kidney disease: Secondary | ICD-10-CM | POA: Diagnosis not present

## 2016-12-13 ENCOUNTER — Ambulatory Visit
Admission: RE | Admit: 2016-12-13 | Discharge: 2016-12-13 | Disposition: A | Discharge status: Home / Self Care | Payer: Medicare Other | Source: Ambulatory Visit | Attending: Radiation Oncology | Admitting: Radiation Oncology

## 2016-12-13 DIAGNOSIS — I129 Hypertensive chronic kidney disease with stage 1 through stage 4 chronic kidney disease, or unspecified chronic kidney disease: Secondary | ICD-10-CM | POA: Diagnosis not present

## 2016-12-13 DIAGNOSIS — N182 Chronic kidney disease, stage 2 (mild): Secondary | ICD-10-CM | POA: Diagnosis not present

## 2016-12-13 DIAGNOSIS — C61 Malignant neoplasm of prostate: Secondary | ICD-10-CM | POA: Diagnosis not present

## 2016-12-13 DIAGNOSIS — Z510 Encounter for antineoplastic radiation therapy: Secondary | ICD-10-CM | POA: Diagnosis not present

## 2016-12-13 DIAGNOSIS — Z8042 Family history of malignant neoplasm of prostate: Secondary | ICD-10-CM | POA: Diagnosis not present

## 2016-12-13 DIAGNOSIS — E1122 Type 2 diabetes mellitus with diabetic chronic kidney disease: Secondary | ICD-10-CM | POA: Diagnosis not present

## 2016-12-14 ENCOUNTER — Ambulatory Visit
Admission: RE | Admit: 2016-12-14 | Discharge: 2016-12-14 | Disposition: A | Discharge status: Home / Self Care | Payer: Medicare Other | Source: Ambulatory Visit | Attending: Radiation Oncology | Admitting: Radiation Oncology

## 2016-12-14 DIAGNOSIS — E1122 Type 2 diabetes mellitus with diabetic chronic kidney disease: Secondary | ICD-10-CM | POA: Diagnosis not present

## 2016-12-14 DIAGNOSIS — N182 Chronic kidney disease, stage 2 (mild): Secondary | ICD-10-CM | POA: Diagnosis not present

## 2016-12-14 DIAGNOSIS — Z510 Encounter for antineoplastic radiation therapy: Secondary | ICD-10-CM | POA: Diagnosis not present

## 2016-12-14 DIAGNOSIS — Z8042 Family history of malignant neoplasm of prostate: Secondary | ICD-10-CM | POA: Diagnosis not present

## 2016-12-14 DIAGNOSIS — I129 Hypertensive chronic kidney disease with stage 1 through stage 4 chronic kidney disease, or unspecified chronic kidney disease: Secondary | ICD-10-CM | POA: Diagnosis not present

## 2016-12-14 DIAGNOSIS — C61 Malignant neoplasm of prostate: Secondary | ICD-10-CM | POA: Diagnosis not present

## 2016-12-15 ENCOUNTER — Ambulatory Visit
Admission: RE | Admit: 2016-12-15 | Discharge: 2016-12-15 | Disposition: A | Discharge status: Home / Self Care | Payer: Medicare Other | Source: Ambulatory Visit | Attending: Radiation Oncology | Admitting: Radiation Oncology

## 2016-12-15 DIAGNOSIS — C61 Malignant neoplasm of prostate: Secondary | ICD-10-CM | POA: Diagnosis not present

## 2016-12-15 DIAGNOSIS — E1122 Type 2 diabetes mellitus with diabetic chronic kidney disease: Secondary | ICD-10-CM | POA: Diagnosis not present

## 2016-12-15 DIAGNOSIS — Z8042 Family history of malignant neoplasm of prostate: Secondary | ICD-10-CM | POA: Diagnosis not present

## 2016-12-15 DIAGNOSIS — I129 Hypertensive chronic kidney disease with stage 1 through stage 4 chronic kidney disease, or unspecified chronic kidney disease: Secondary | ICD-10-CM | POA: Diagnosis not present

## 2016-12-15 DIAGNOSIS — N182 Chronic kidney disease, stage 2 (mild): Secondary | ICD-10-CM | POA: Diagnosis not present

## 2016-12-15 DIAGNOSIS — Z510 Encounter for antineoplastic radiation therapy: Secondary | ICD-10-CM | POA: Diagnosis not present

## 2016-12-18 ENCOUNTER — Ambulatory Visit: Payer: Medicare Other

## 2016-12-19 ENCOUNTER — Ambulatory Visit
Admission: RE | Admit: 2016-12-19 | Discharge: 2016-12-19 | Disposition: A | Discharge status: Home / Self Care | Payer: Medicare Other | Source: Ambulatory Visit | Attending: Radiation Oncology | Admitting: Radiation Oncology

## 2016-12-19 DIAGNOSIS — E1122 Type 2 diabetes mellitus with diabetic chronic kidney disease: Secondary | ICD-10-CM | POA: Diagnosis not present

## 2016-12-19 DIAGNOSIS — Z8042 Family history of malignant neoplasm of prostate: Secondary | ICD-10-CM | POA: Diagnosis not present

## 2016-12-19 DIAGNOSIS — C61 Malignant neoplasm of prostate: Secondary | ICD-10-CM | POA: Diagnosis not present

## 2016-12-19 DIAGNOSIS — N182 Chronic kidney disease, stage 2 (mild): Secondary | ICD-10-CM | POA: Diagnosis not present

## 2016-12-19 DIAGNOSIS — Z510 Encounter for antineoplastic radiation therapy: Secondary | ICD-10-CM | POA: Diagnosis not present

## 2016-12-19 DIAGNOSIS — I129 Hypertensive chronic kidney disease with stage 1 through stage 4 chronic kidney disease, or unspecified chronic kidney disease: Secondary | ICD-10-CM | POA: Diagnosis not present

## 2016-12-20 ENCOUNTER — Ambulatory Visit
Admission: RE | Admit: 2016-12-20 | Discharge: 2016-12-20 | Disposition: A | Discharge status: Home / Self Care | Payer: Medicare Other | Source: Ambulatory Visit | Attending: Radiation Oncology | Admitting: Radiation Oncology

## 2016-12-20 DIAGNOSIS — E1122 Type 2 diabetes mellitus with diabetic chronic kidney disease: Secondary | ICD-10-CM | POA: Diagnosis not present

## 2016-12-20 DIAGNOSIS — C61 Malignant neoplasm of prostate: Secondary | ICD-10-CM | POA: Diagnosis not present

## 2016-12-20 DIAGNOSIS — Z8042 Family history of malignant neoplasm of prostate: Secondary | ICD-10-CM | POA: Diagnosis not present

## 2016-12-20 DIAGNOSIS — N182 Chronic kidney disease, stage 2 (mild): Secondary | ICD-10-CM | POA: Diagnosis not present

## 2016-12-20 DIAGNOSIS — I129 Hypertensive chronic kidney disease with stage 1 through stage 4 chronic kidney disease, or unspecified chronic kidney disease: Secondary | ICD-10-CM | POA: Diagnosis not present

## 2016-12-20 DIAGNOSIS — Z510 Encounter for antineoplastic radiation therapy: Secondary | ICD-10-CM | POA: Diagnosis not present

## 2016-12-21 ENCOUNTER — Ambulatory Visit
Admission: RE | Admit: 2016-12-21 | Discharge: 2016-12-21 | Disposition: A | Discharge status: Home / Self Care | Payer: Medicare Other | Source: Ambulatory Visit | Attending: Radiation Oncology | Admitting: Radiation Oncology

## 2016-12-21 DIAGNOSIS — C61 Malignant neoplasm of prostate: Secondary | ICD-10-CM | POA: Diagnosis not present

## 2016-12-21 DIAGNOSIS — Z510 Encounter for antineoplastic radiation therapy: Secondary | ICD-10-CM | POA: Diagnosis not present

## 2016-12-21 DIAGNOSIS — E1122 Type 2 diabetes mellitus with diabetic chronic kidney disease: Secondary | ICD-10-CM | POA: Diagnosis not present

## 2016-12-21 DIAGNOSIS — Z8042 Family history of malignant neoplasm of prostate: Secondary | ICD-10-CM | POA: Diagnosis not present

## 2016-12-21 DIAGNOSIS — I129 Hypertensive chronic kidney disease with stage 1 through stage 4 chronic kidney disease, or unspecified chronic kidney disease: Secondary | ICD-10-CM | POA: Diagnosis not present

## 2016-12-21 DIAGNOSIS — N182 Chronic kidney disease, stage 2 (mild): Secondary | ICD-10-CM | POA: Diagnosis not present

## 2016-12-22 ENCOUNTER — Ambulatory Visit
Admission: RE | Admit: 2016-12-22 | Discharge: 2016-12-22 | Disposition: A | Discharge status: Home / Self Care | Payer: Medicare Other | Source: Ambulatory Visit | Attending: Radiation Oncology | Admitting: Radiation Oncology

## 2016-12-22 DIAGNOSIS — E1122 Type 2 diabetes mellitus with diabetic chronic kidney disease: Secondary | ICD-10-CM | POA: Diagnosis not present

## 2016-12-22 DIAGNOSIS — C61 Malignant neoplasm of prostate: Secondary | ICD-10-CM | POA: Diagnosis not present

## 2016-12-22 DIAGNOSIS — N182 Chronic kidney disease, stage 2 (mild): Secondary | ICD-10-CM | POA: Diagnosis not present

## 2016-12-22 DIAGNOSIS — Z8042 Family history of malignant neoplasm of prostate: Secondary | ICD-10-CM | POA: Diagnosis not present

## 2016-12-22 DIAGNOSIS — Z510 Encounter for antineoplastic radiation therapy: Secondary | ICD-10-CM | POA: Diagnosis not present

## 2016-12-22 DIAGNOSIS — I129 Hypertensive chronic kidney disease with stage 1 through stage 4 chronic kidney disease, or unspecified chronic kidney disease: Secondary | ICD-10-CM | POA: Diagnosis not present

## 2016-12-25 ENCOUNTER — Ambulatory Visit
Admission: RE | Admit: 2016-12-25 | Discharge: 2016-12-25 | Disposition: A | Discharge status: Home / Self Care | Payer: Medicare Other | Source: Ambulatory Visit | Attending: Radiation Oncology | Admitting: Radiation Oncology

## 2016-12-25 DIAGNOSIS — Z510 Encounter for antineoplastic radiation therapy: Secondary | ICD-10-CM | POA: Diagnosis not present

## 2016-12-25 DIAGNOSIS — C61 Malignant neoplasm of prostate: Secondary | ICD-10-CM | POA: Diagnosis not present

## 2016-12-26 ENCOUNTER — Ambulatory Visit
Admission: RE | Admit: 2016-12-26 | Discharge: 2016-12-26 | Disposition: A | Discharge status: Home / Self Care | Payer: Medicare Other | Source: Ambulatory Visit | Attending: Radiation Oncology | Admitting: Radiation Oncology

## 2016-12-26 DIAGNOSIS — C61 Malignant neoplasm of prostate: Secondary | ICD-10-CM | POA: Diagnosis not present

## 2016-12-26 DIAGNOSIS — Z510 Encounter for antineoplastic radiation therapy: Secondary | ICD-10-CM | POA: Diagnosis not present

## 2016-12-27 ENCOUNTER — Ambulatory Visit
Admission: RE | Admit: 2016-12-27 | Discharge: 2016-12-27 | Disposition: A | Discharge status: Home / Self Care | Payer: Medicare Other | Source: Ambulatory Visit | Attending: Radiation Oncology | Admitting: Radiation Oncology

## 2016-12-27 DIAGNOSIS — C61 Malignant neoplasm of prostate: Secondary | ICD-10-CM | POA: Diagnosis not present

## 2016-12-27 DIAGNOSIS — Z510 Encounter for antineoplastic radiation therapy: Secondary | ICD-10-CM | POA: Diagnosis not present

## 2016-12-28 ENCOUNTER — Ambulatory Visit
Admission: RE | Admit: 2016-12-28 | Discharge: 2016-12-28 | Disposition: A | Discharge status: Home / Self Care | Payer: Medicare Other | Source: Ambulatory Visit | Attending: Radiation Oncology | Admitting: Radiation Oncology

## 2016-12-28 DIAGNOSIS — C61 Malignant neoplasm of prostate: Secondary | ICD-10-CM | POA: Diagnosis not present

## 2016-12-28 DIAGNOSIS — Z510 Encounter for antineoplastic radiation therapy: Secondary | ICD-10-CM | POA: Diagnosis not present

## 2016-12-29 ENCOUNTER — Ambulatory Visit
Admission: RE | Admit: 2016-12-29 | Discharge: 2016-12-29 | Disposition: A | Discharge status: Home / Self Care | Payer: Medicare Other | Source: Ambulatory Visit | Attending: Radiation Oncology | Admitting: Radiation Oncology

## 2016-12-29 ENCOUNTER — Other Ambulatory Visit: Payer: Self-pay | Admitting: Radiation Oncology

## 2016-12-29 DIAGNOSIS — C61 Malignant neoplasm of prostate: Secondary | ICD-10-CM | POA: Diagnosis not present

## 2016-12-29 DIAGNOSIS — Z510 Encounter for antineoplastic radiation therapy: Secondary | ICD-10-CM | POA: Diagnosis not present

## 2016-12-29 MED ORDER — PHENAZOPYRIDINE HCL 200 MG PO TABS: 200.0000 mg | ORAL_TABLET | Freq: Three times a day (TID) | ORAL | 0 refills | Status: DC | PRN

## 2016-12-29 MED ORDER — NITROFURANTOIN MONOHYD MACRO 100 MG PO CAPS: 100.0000 mg | ORAL_CAPSULE | Freq: Two times a day (BID) | ORAL | 0 refills | Status: DC

## 2017-01-01 ENCOUNTER — Ambulatory Visit
Admission: RE | Admit: 2017-01-01 | Discharge: 2017-01-01 | Disposition: A | Discharge status: Home / Self Care | Payer: Medicare Other | Source: Ambulatory Visit | Attending: Radiation Oncology | Admitting: Radiation Oncology

## 2017-01-01 DIAGNOSIS — Z510 Encounter for antineoplastic radiation therapy: Secondary | ICD-10-CM | POA: Diagnosis not present

## 2017-01-01 DIAGNOSIS — C61 Malignant neoplasm of prostate: Secondary | ICD-10-CM | POA: Diagnosis not present

## 2017-01-03 ENCOUNTER — Ambulatory Visit
Admission: RE | Admit: 2017-01-03 | Discharge: 2017-01-03 | Disposition: A | Discharge status: Home / Self Care | Payer: Medicare Other | Source: Ambulatory Visit | Attending: Radiation Oncology | Admitting: Radiation Oncology

## 2017-01-03 DIAGNOSIS — Z510 Encounter for antineoplastic radiation therapy: Secondary | ICD-10-CM | POA: Diagnosis not present

## 2017-01-03 DIAGNOSIS — C61 Malignant neoplasm of prostate: Secondary | ICD-10-CM | POA: Diagnosis not present

## 2017-01-04 ENCOUNTER — Ambulatory Visit
Admission: RE | Admit: 2017-01-04 | Discharge: 2017-01-04 | Disposition: A | Discharge status: Home / Self Care | Payer: Medicare Other | Source: Ambulatory Visit | Attending: Radiation Oncology | Admitting: Radiation Oncology

## 2017-01-04 DIAGNOSIS — Z510 Encounter for antineoplastic radiation therapy: Secondary | ICD-10-CM | POA: Diagnosis not present

## 2017-01-04 DIAGNOSIS — C61 Malignant neoplasm of prostate: Secondary | ICD-10-CM | POA: Diagnosis not present

## 2017-01-05 ENCOUNTER — Ambulatory Visit
Admission: RE | Admit: 2017-01-05 | Discharge: 2017-01-05 | Disposition: A | Discharge status: Home / Self Care | Payer: Medicare Other | Source: Ambulatory Visit | Attending: Radiation Oncology | Admitting: Radiation Oncology

## 2017-01-05 DIAGNOSIS — G894 Chronic pain syndrome: Secondary | ICD-10-CM | POA: Diagnosis not present

## 2017-01-05 DIAGNOSIS — M79606 Pain in leg, unspecified: Secondary | ICD-10-CM | POA: Diagnosis not present

## 2017-01-05 DIAGNOSIS — C61 Malignant neoplasm of prostate: Secondary | ICD-10-CM | POA: Diagnosis not present

## 2017-01-05 DIAGNOSIS — M542 Cervicalgia: Secondary | ICD-10-CM | POA: Diagnosis not present

## 2017-01-05 DIAGNOSIS — M545 Low back pain: Secondary | ICD-10-CM | POA: Diagnosis not present

## 2017-01-05 DIAGNOSIS — Z510 Encounter for antineoplastic radiation therapy: Secondary | ICD-10-CM | POA: Diagnosis not present

## 2017-01-08 ENCOUNTER — Ambulatory Visit
Admission: RE | Admit: 2017-01-08 | Discharge: 2017-01-08 | Disposition: A | Discharge status: Home / Self Care | Payer: Medicare Other | Source: Ambulatory Visit | Attending: Radiation Oncology | Admitting: Radiation Oncology

## 2017-01-08 DIAGNOSIS — C61 Malignant neoplasm of prostate: Secondary | ICD-10-CM | POA: Diagnosis not present

## 2017-01-08 DIAGNOSIS — Z510 Encounter for antineoplastic radiation therapy: Secondary | ICD-10-CM | POA: Diagnosis not present

## 2017-01-10 ENCOUNTER — Ambulatory Visit
Admission: RE | Admit: 2017-01-10 | Discharge: 2017-01-10 | Disposition: A | Discharge status: Home / Self Care | Payer: Medicare Other | Source: Ambulatory Visit | Attending: Radiation Oncology | Admitting: Radiation Oncology

## 2017-01-10 DIAGNOSIS — C61 Malignant neoplasm of prostate: Secondary | ICD-10-CM | POA: Diagnosis not present

## 2017-01-10 DIAGNOSIS — Z510 Encounter for antineoplastic radiation therapy: Secondary | ICD-10-CM | POA: Diagnosis not present

## 2017-01-11 ENCOUNTER — Ambulatory Visit
Admission: RE | Admit: 2017-01-11 | Discharge: 2017-01-11 | Disposition: A | Discharge status: Home / Self Care | Payer: Medicare Other | Source: Ambulatory Visit | Attending: Radiation Oncology | Admitting: Radiation Oncology

## 2017-01-11 DIAGNOSIS — Z510 Encounter for antineoplastic radiation therapy: Secondary | ICD-10-CM | POA: Diagnosis not present

## 2017-01-11 DIAGNOSIS — R300 Dysuria: Secondary | ICD-10-CM | POA: Diagnosis not present

## 2017-01-11 DIAGNOSIS — Z8546 Personal history of malignant neoplasm of prostate: Secondary | ICD-10-CM | POA: Diagnosis not present

## 2017-01-11 DIAGNOSIS — C61 Malignant neoplasm of prostate: Secondary | ICD-10-CM | POA: Diagnosis not present

## 2017-01-12 ENCOUNTER — Ambulatory Visit
Admission: RE | Admit: 2017-01-12 | Discharge: 2017-01-12 | Disposition: A | Discharge status: Home / Self Care | Payer: Medicare Other | Source: Ambulatory Visit | Attending: Radiation Oncology | Admitting: Radiation Oncology

## 2017-01-12 DIAGNOSIS — C61 Malignant neoplasm of prostate: Secondary | ICD-10-CM | POA: Diagnosis not present

## 2017-01-12 DIAGNOSIS — Z510 Encounter for antineoplastic radiation therapy: Secondary | ICD-10-CM | POA: Diagnosis not present

## 2017-01-12 DIAGNOSIS — R300 Dysuria: Secondary | ICD-10-CM | POA: Diagnosis not present

## 2017-01-15 ENCOUNTER — Ambulatory Visit
Admission: RE | Admit: 2017-01-15 | Discharge: 2017-01-15 | Disposition: A | Discharge status: Home / Self Care | Payer: Medicare Other | Source: Ambulatory Visit | Attending: Radiation Oncology | Admitting: Radiation Oncology

## 2017-01-15 DIAGNOSIS — C61 Malignant neoplasm of prostate: Secondary | ICD-10-CM | POA: Diagnosis not present

## 2017-01-15 DIAGNOSIS — Z510 Encounter for antineoplastic radiation therapy: Secondary | ICD-10-CM | POA: Diagnosis not present

## 2017-01-16 ENCOUNTER — Ambulatory Visit
Admission: RE | Admit: 2017-01-16 | Discharge: 2017-01-16 | Disposition: A | Discharge status: Home / Self Care | Payer: Medicare Other | Source: Ambulatory Visit | Attending: Radiation Oncology | Admitting: Radiation Oncology

## 2017-01-16 DIAGNOSIS — Z510 Encounter for antineoplastic radiation therapy: Secondary | ICD-10-CM | POA: Diagnosis not present

## 2017-01-16 DIAGNOSIS — C61 Malignant neoplasm of prostate: Secondary | ICD-10-CM | POA: Diagnosis not present

## 2017-01-17 ENCOUNTER — Ambulatory Visit
Admission: RE | Admit: 2017-01-17 | Discharge: 2017-01-17 | Disposition: A | Discharge status: Home / Self Care | Payer: Medicare Other | Source: Ambulatory Visit | Attending: Radiation Oncology | Admitting: Radiation Oncology

## 2017-01-17 DIAGNOSIS — Z510 Encounter for antineoplastic radiation therapy: Secondary | ICD-10-CM | POA: Diagnosis not present

## 2017-01-17 DIAGNOSIS — C61 Malignant neoplasm of prostate: Secondary | ICD-10-CM | POA: Diagnosis not present

## 2017-01-18 ENCOUNTER — Ambulatory Visit
Admission: RE | Admit: 2017-01-18 | Discharge: 2017-01-18 | Disposition: A | Discharge status: Home / Self Care | Payer: Medicare Other | Source: Ambulatory Visit | Attending: Radiation Oncology | Admitting: Radiation Oncology

## 2017-01-18 DIAGNOSIS — C61 Malignant neoplasm of prostate: Secondary | ICD-10-CM | POA: Diagnosis not present

## 2017-01-18 DIAGNOSIS — Z510 Encounter for antineoplastic radiation therapy: Secondary | ICD-10-CM | POA: Diagnosis not present

## 2017-01-19 ENCOUNTER — Ambulatory Visit
Admission: RE | Admit: 2017-01-19 | Discharge: 2017-01-19 | Disposition: A | Discharge status: Home / Self Care | Payer: Medicare Other | Source: Ambulatory Visit | Attending: Radiation Oncology | Admitting: Radiation Oncology

## 2017-01-19 DIAGNOSIS — C61 Malignant neoplasm of prostate: Secondary | ICD-10-CM | POA: Diagnosis not present

## 2017-01-19 DIAGNOSIS — Z510 Encounter for antineoplastic radiation therapy: Secondary | ICD-10-CM | POA: Diagnosis not present

## 2017-01-22 ENCOUNTER — Telehealth: Payer: Self-pay | Admitting: Neurology

## 2017-01-22 ENCOUNTER — Other Ambulatory Visit: Payer: Self-pay | Admitting: Neurology

## 2017-01-22 ENCOUNTER — Ambulatory Visit
Admission: RE | Admit: 2017-01-22 | Discharge: 2017-01-22 | Disposition: A | Discharge status: Home / Self Care | Payer: Medicare Other | Source: Ambulatory Visit | Attending: Radiation Oncology | Admitting: Radiation Oncology

## 2017-01-22 DIAGNOSIS — C61 Malignant neoplasm of prostate: Secondary | ICD-10-CM | POA: Diagnosis not present

## 2017-01-22 DIAGNOSIS — Z510 Encounter for antineoplastic radiation therapy: Secondary | ICD-10-CM | POA: Diagnosis not present

## 2017-01-22 DIAGNOSIS — G4733 Obstructive sleep apnea (adult) (pediatric): Secondary | ICD-10-CM

## 2017-01-22 NOTE — Telephone Encounter (Signed)
Called the patient. He came in to the office today inquiring about cpap parts. I dont see where we had ordered anything when he was last seen. I have contacted the patient to get a better understanding on what it is he is needing from [place] Appears his DME is [facility]. Need to confirm that when he calls back

## 2017-01-23 ENCOUNTER — Ambulatory Visit
Admission: RE | Admit: 2017-01-23 | Discharge: 2017-01-23 | Disposition: A | Discharge status: Home / Self Care | Payer: Medicare Other | Source: Ambulatory Visit | Attending: Radiation Oncology | Admitting: Radiation Oncology

## 2017-01-23 DIAGNOSIS — I10 Essential (primary) hypertension: Secondary | ICD-10-CM | POA: Diagnosis not present

## 2017-01-23 DIAGNOSIS — E1165 Type 2 diabetes mellitus with hyperglycemia: Secondary | ICD-10-CM | POA: Diagnosis not present

## 2017-01-23 DIAGNOSIS — G609 Hereditary and idiopathic neuropathy, unspecified: Secondary | ICD-10-CM | POA: Diagnosis not present

## 2017-01-23 DIAGNOSIS — Z510 Encounter for antineoplastic radiation therapy: Secondary | ICD-10-CM | POA: Diagnosis not present

## 2017-01-23 DIAGNOSIS — E7800 Pure hypercholesterolemia, unspecified: Secondary | ICD-10-CM | POA: Diagnosis not present

## 2017-01-23 DIAGNOSIS — C61 Malignant neoplasm of prostate: Secondary | ICD-10-CM | POA: Diagnosis not present

## 2017-01-23 DIAGNOSIS — E669 Obesity, unspecified: Secondary | ICD-10-CM | POA: Diagnosis not present

## 2017-01-24 ENCOUNTER — Ambulatory Visit
Admission: RE | Admit: 2017-01-24 | Discharge: 2017-01-24 | Disposition: A | Discharge status: Home / Self Care | Payer: Medicare Other | Source: Ambulatory Visit | Attending: Radiation Oncology | Admitting: Radiation Oncology

## 2017-01-24 DIAGNOSIS — C61 Malignant neoplasm of prostate: Secondary | ICD-10-CM | POA: Diagnosis not present

## 2017-01-24 DIAGNOSIS — Z510 Encounter for antineoplastic radiation therapy: Secondary | ICD-10-CM | POA: Diagnosis not present

## 2017-01-25 ENCOUNTER — Ambulatory Visit: Payer: Medicare Other

## 2017-01-25 ENCOUNTER — Ambulatory Visit
Admission: RE | Admit: 2017-01-25 | Discharge: 2017-01-25 | Disposition: A | Discharge status: Home / Self Care | Payer: Medicare Other | Source: Ambulatory Visit | Attending: Radiation Oncology | Admitting: Radiation Oncology

## 2017-01-25 DIAGNOSIS — C61 Malignant neoplasm of prostate: Secondary | ICD-10-CM | POA: Diagnosis not present

## 2017-01-25 DIAGNOSIS — Z510 Encounter for antineoplastic radiation therapy: Secondary | ICD-10-CM | POA: Diagnosis not present

## 2017-01-25 DIAGNOSIS — R300 Dysuria: Secondary | ICD-10-CM | POA: Diagnosis not present

## 2017-01-25 DIAGNOSIS — Z8546 Personal history of malignant neoplasm of prostate: Secondary | ICD-10-CM | POA: Diagnosis not present

## 2017-01-26 ENCOUNTER — Encounter: Payer: Self-pay | Admitting: Radiation Oncology

## 2017-01-26 ENCOUNTER — Ambulatory Visit
Admission: RE | Admit: 2017-01-26 | Discharge: 2017-01-26 | Disposition: A | Discharge status: Home / Self Care | Payer: Medicare Other | Source: Ambulatory Visit | Attending: Radiation Oncology | Admitting: Radiation Oncology

## 2017-01-26 DIAGNOSIS — Z510 Encounter for antineoplastic radiation therapy: Secondary | ICD-10-CM | POA: Diagnosis not present

## 2017-01-26 DIAGNOSIS — C61 Malignant neoplasm of prostate: Secondary | ICD-10-CM | POA: Diagnosis not present

## 2017-01-30 DIAGNOSIS — L603 Nail dystrophy: Secondary | ICD-10-CM | POA: Diagnosis not present

## 2017-01-30 DIAGNOSIS — E1051 Type 1 diabetes mellitus with diabetic peripheral angiopathy without gangrene: Secondary | ICD-10-CM | POA: Diagnosis not present

## 2017-01-30 DIAGNOSIS — I739 Peripheral vascular disease, unspecified: Secondary | ICD-10-CM | POA: Diagnosis not present

## 2017-01-31 NOTE — Progress Notes (Signed)
  Radiation Oncology         [phone]) [phone] ________________________________  Name: Steve Brown MRN: [id number]  Date: 01/26/2017  DOB: 1940-10-20  End of Treatment Note  Diagnosis:   77 y.o. male with Stage T1cadenocarcinoma of the prostate with Gleason Score of 3+4, and PSA of5.57    Indication for treatment:  Curative, Definitive Radiotherapy       Radiation treatment dates:   11/27/2016 - 01/26/2017  Site/dose:   The prostate was treated to 78 Gy in 40 fractions of 1.95 Gy  Beams/energy:   The patient was treated with IMRT using volumetric arc therapy delivering 6 MV X-rays to clockwise and counterclockwise circumferential arcs with a 90 degree collimator offset to avoid dose scalloping.  Image guidance was performed with daily cone beam CT prior to each fraction to align to gold markers in the prostate and assure proper bladder and rectal fill volumes.  Immobilization was achieved with BodyFix custom mold.  Narrative: The patient tolerated radiation treatment relatively well.  He experienced modest fatigue and some minor urinary irritation symptoms of nocturia x4-5, urgency with leakage, weak stream, and incomplete emptying which was managed with Flomax.  He also experienced dysuria which was treated with Urogesic Blue with relief. Towards the end of treatment, he reported some dysuria but denied any hematuria. He developed a UTI  12/08/16 that was treated with an [place].  He took a second round of antibiotics from his PCP due to persistent abdominal discomfort and dysuria and the dysuria mildly improved. He also reported occasional constipation, relieved by a stool softener.  Plan: The patient has completed radiation treatment. He will return to radiation oncology clinic for routine followup in one month. I advised him to call or return sooner if he has any questions or concerns related to his recovery or treatment. ________________________________  Sheral Apley. Tammi Klippel, M.D.  This  document serves as a record of services personally performed by Tyler Pita, MD. It was created on his behalf by Rae Lips, a trained medical scribe. The creation of this record is based on the scribe's personal observations and the provider's statements to them. This document has been checked and approved by the attending provider.

## 2017-02-27 DIAGNOSIS — R109 Unspecified abdominal pain: Secondary | ICD-10-CM | POA: Diagnosis not present

## 2017-02-27 DIAGNOSIS — R05 Cough: Secondary | ICD-10-CM | POA: Diagnosis not present

## 2017-02-27 DIAGNOSIS — R319 Hematuria, unspecified: Secondary | ICD-10-CM | POA: Diagnosis not present

## 2017-03-02 ENCOUNTER — Encounter: Payer: Self-pay | Admitting: *Deleted

## 2017-03-02 ENCOUNTER — Inpatient Hospital Stay: Admission: RE | Admit: 2017-03-02 | Payer: Self-pay | Source: Ambulatory Visit | Admitting: Urology

## 2017-03-02 NOTE — Progress Notes (Signed)
Steve Brown with Steve Brown he has the flu since Friday of last week he went to see his PCP Dr. Harrington Challenger and started Amoxicillin qid afebrile.  Taking po fluids without problems. He saw Dr. Louis Meckel on 02-13-17 for hematuria. Will not be able to come in for his one month follow up appointment today.  Steve Bruning, PA-C made aware.

## 2017-03-09 ENCOUNTER — Encounter: Payer: Self-pay | Admitting: Urology

## 2017-03-09 ENCOUNTER — Telehealth: Payer: Self-pay | Admitting: Radiation Oncology

## 2017-03-09 DIAGNOSIS — R310 Gross hematuria: Secondary | ICD-10-CM | POA: Diagnosis not present

## 2017-03-09 NOTE — Progress Notes (Signed)
This patient called this afternoon reporting increased gross hematuria, weak stream, incomplete bladder emptying, incontinence and suprapubic discomfort.  He completed prostate external beam radiotherapy on January 26, 2017 for treatment of his Gleason 3+4 prostate cancer.  He has a history of intermittent gross hematuria, prostatitis and previous negative hematuria evaluations.  His most recent cystoscopy for hematuria was in March 2018 with Dr. Louis Meckel revealing bilateral Hutch diverticulum and prostatitis but otherwise unremarkable.  He reports seeing bright red blood with occasional dark clots in the urine over the past 48 hours and is having increasing difficulty emptying his bladder.  I have called [facility] and they have graciously offered him an acute evaluation appointment with Steve Roam, NP at 2:15 PM today.  Steve Pepper, RN will notify the patient. With  Nicholos Johns, PA-C

## 2017-03-09 NOTE — Telephone Encounter (Signed)
Received voicemail message from patient requesting a return call. Phoned patient back. Patient reports bright red to dark red hematuria. Reports dysuria, nocturia x 4-5, and urinary leakage. Denies diarrhea. Patient reports he is afebrile. Patient consulted on 09/25/2016 and hematuria was reported then. Patient completed radiation therapy on 01/26/2017 after receiving 78 Gy in 40 fractions. Patient evaluated by Dr. Louis Meckel on 01/11/2017 and reported hematuria then. Patient missed one month follow up with Ashlyn Bruning, PA-C because he had the flu. Informed Ashlyn of these findings. Ashlyn arranged for the patient to be evaluated by Jimmey Ralph, NP at [facility]. Phoned patient back and instructed him to present to [facility] for a 2:15 pm appointment. Patient verbalized understanding.

## 2017-03-20 ENCOUNTER — Ambulatory Visit
Admission: RE | Admit: 2017-03-20 | Discharge: 2017-03-20 | Disposition: A | Discharge status: Home / Self Care | Payer: Medicare Other | Source: Ambulatory Visit | Attending: Urology | Admitting: Urology

## 2017-03-20 ENCOUNTER — Encounter: Payer: Self-pay | Admitting: *Deleted

## 2017-03-20 ENCOUNTER — Encounter: Payer: Self-pay | Admitting: Urology

## 2017-03-20 VITALS — BP 159/72 | HR 60 | Temp 98.3°F | Resp 20 | Ht 65.0 in | Wt 194.8 lb

## 2017-03-20 DIAGNOSIS — R310 Gross hematuria: Secondary | ICD-10-CM | POA: Diagnosis not present

## 2017-03-20 DIAGNOSIS — Z923 Personal history of irradiation: Secondary | ICD-10-CM | POA: Insufficient documentation

## 2017-03-20 DIAGNOSIS — Z888 Allergy status to other drugs, medicaments and biological substances status: Secondary | ICD-10-CM | POA: Diagnosis not present

## 2017-03-20 DIAGNOSIS — Z79899 Other long term (current) drug therapy: Secondary | ICD-10-CM | POA: Diagnosis not present

## 2017-03-20 DIAGNOSIS — Z7984 Long term (current) use of oral hypoglycemic drugs: Secondary | ICD-10-CM | POA: Diagnosis not present

## 2017-03-20 DIAGNOSIS — G8929 Other chronic pain: Secondary | ICD-10-CM | POA: Insufficient documentation

## 2017-03-20 DIAGNOSIS — C61 Malignant neoplasm of prostate: Secondary | ICD-10-CM | POA: Insufficient documentation

## 2017-03-20 NOTE — Progress Notes (Signed)
[place] spoke with Mr. Wilkerson appointment moved from 1530 to 1500 today per [employer], PA-C.

## 2017-03-20 NOTE — Progress Notes (Signed)
Radiation Oncology         [phone] ________________________________  Name: Steve Brown MRN: [id number]  Date: 03/20/2017  DOB: 1941/01/08  Post Treatment Note  CC: Lawerance Cruel, MD  Ardis Hughs, MD  Diagnosis:   77 y.o. male with Stage T1cadenocarcinoma of the prostate with Gleason Score of 3+4, and PSA of5.57    Interval Since Last Radiation:  8 weeks  11/27/2016 - 01/26/2017:  The prostate was treated to 78 Gy in 40 fractions of 1.95 Gy  Narrative:  The patient returns today for routine follow-up.  He tolerated radiation treatment relatively well.  He experienced modest fatigue and some minor urinary irritation symptoms of nocturia x4-5, urgency with leakage, weak stream, and incomplete emptying which was managed with Flomax.  He also experienced dysuria which was treated with Urogesic Blue with relief. Towards the end of treatment, he reported some dysuria but denied any hematuria. He developed a UTI  12/08/16 that was treated with an [place].  He took a second round of antibiotics from his PCP due to persistent abdominal discomfort and dysuria and the dysuria mildly improved. He also reported occasional constipation, relieved by a stool softener.                              On review of systems, the patient states that he is doing well overall but continues to struggle with lower urinary tract symptoms.  His current IPS S score is 18 indicating moderate urinary symptoms.  His most bothersome symptoms are increased frequency and nocturia 4 times per night.  He continues with persistent dysuria on most days although he reports that there are some days where he does not notice it at all.  He also continues with intermittent bouts of gross hematuria but has not been passing any clots recently.  When he notices blood in the urine he increases the amount of water he is drinking to help flush his bladder.  He also continues to experience some chronic aching in his pelvis as  well as an achy pain in the penis.  All of these symptoms seem to be more noticeable since completing radiation treatments however they were all also present prior to beginning radiation treatments.  He reports that his bowels are back to normal and he is having a normal bowel movement regularly.  He denies abdominal pain, nausea, vomiting or diarrhea.  He has gained approximately 5 pounds over the last month since he has been down and out with the flu.  He is now on the mend and is looking forward to getting back into a regular exercise routine at the [facility].  ALLERGIES:  is allergic to other and phenergan [promethazine].  Meds: Current Outpatient Medications  Medication Sig Dispense Refill  . acetaminophen (TYLENOL) 500 MG tablet Take 1,000 mg every 6 (six) hours as needed by mouth.    . Ascorbic Acid (VITAMIN C PO) Take 1 tablet by mouth daily.    . BD PEN NEEDLE NANO U/F 32G X 4 MM MISC by Does not apply route.     . bisacodyl (DULCOLAX) 5 MG EC tablet Take 5 mg by mouth daily as needed for moderate constipation.    . cholecalciferol (VITAMIN D) 1000 UNITS tablet Take 1,000 Units by mouth daily.    . diclofenac sodium (VOLTAREN) 1 % GEL Apply 1 application topically daily as needed.  1  . fluticasone (FLONASE) 50 MCG/ACT nasal  spray Place 1 spray into both nostrils daily.  0  . furosemide (LASIX) 20 MG tablet Take 20 mg by mouth daily as needed (swelling).     Marland Kitchen HUMALOG MIX 75/25 KWIKPEN (75-25) 100 UNIT/ML Kwikpen Inject 18-20 Units into the skin 2 (two) times daily. 18 units in evening, and30 units in the morning.  1  . memantine (NAMENDA) 10 MG tablet Take 1 tablet (10 mg total) by mouth 2 (two) times daily. (Patient taking differently: Take 10 mg by mouth 2 (two) times daily. Pt out ot med) 180 tablet 1  . metFORMIN (GLUCOPHAGE-XR) 500 MG 24 hr tablet Take 500 mg by mouth every evening.     . pantoprazole (PROTONIX) 40 MG tablet Take 40 mg by mouth daily. Pt out of med    . phenazopyridine  (PYRIDIUM) 200 MG tablet Take 1 tablet (200 mg total) by mouth 3 (three) times daily as needed for pain. 30 tablet 0  . pregabalin (LYRICA) 300 MG capsule Take 1 capsule (300 mg total) by mouth 2 (two) times daily. 180 capsule 3  . ramipril (ALTACE) 2.5 MG capsule Take 2.5 mg by mouth daily.  1  . rivaroxaban (XARELTO) 20 MG TABS tablet Take 1 tablet (20 mg total) by mouth daily. Resume in 72 hours after procedure. 30 tablet   . rosuvastatin (CRESTOR) 10 MG tablet Take 10 mg by mouth every evening.     . tamsulosin (FLOMAX) 0.4 MG CAPS capsule Take 0.4 mg by mouth daily.    . valACYclovir (VALTREX) 500 MG tablet Take 500 mg by mouth daily.    . nitrofurantoin, macrocrystal-monohydrate, (MACROBID) 100 MG capsule Take 1 capsule (100 mg total) by mouth 2 (two) times daily. (Patient not taking: Reported on 03/20/2017) 14 capsule 0  . nitroGLYCERIN (NITROSTAT) 0.4 MG SL tablet Place 1 tablet (0.4 mg total) under the tongue every 5 (five) minutes as needed for chest pain. (Patient not taking: Reported on 03/20/2017) 25 tablet 12  . repaglinide (PRANDIN) 1 MG tablet Take 1 mg by mouth daily as needed.    . traMADol (ULTRAM) 50 MG tablet Take 1-2 tablets (50-100 mg total) every 6 (six) hours as needed by mouth for moderate pain. (Patient not taking: Reported on 03/20/2017) 15 tablet 0   No current facility-administered medications for this encounter.     Physical Findings:  height is 5\' 5"  (1.651 m) and weight is 194 lb 12.8 oz (88.4 kg). His oral temperature is 98.3 F (36.8 C). His blood pressure is 159/72 (abnormal) and his pulse is 60. His respiration is 20 and oxygen saturation is 100%.  Pain Assessment Pain Score: 5  Pain Loc: Back(Back and pelvic area)/10 In general this is a well appearing African-American male in no acute distress.  He's alert and oriented x4 and appropriate throughout the examination. Cardiopulmonary assessment is negative for acute distress and he exhibits normal effort.   Lab  Findings: Lab Results  Component Value Date   WBC 5.0 11/12/2016   HGB 12.9 (L) 11/12/2016   HCT 36.8 (L) 11/12/2016   MCV 88.2 11/12/2016   PLT 225 11/12/2016     Radiographic Findings: No results found.  Impression/Plan: 1. 77 y.o. male with Stage T1cadenocarcinoma of the prostate with Gleason Score of 3+4, and PSA of5.57.   He will continue to follow up with urology for ongoing PSA determinations and has an appointment scheduled with Dr. Louis Meckel on April 24, 2017. He understands what to expect with regards to PSA monitoring going  forward. I will look forward to following his response to treatment via correspondence with urology, and would be happy to continue to participate in his care if clinically indicated. I talked to the patient about what to expect in the future, including his risk for erectile dysfunction and rectal bleeding. I encouraged him to call or return to the office if he has any questions regarding his previous radiation or possible radiation side effects. He was comfortable with this plan and will follow up as needed. 2. Intermittent gross hematuria and chronic pelvic pain.  I have encouraged him that his symptoms will likely continue to gradually improve with time.  However, I have also cautioned him that I am concerned that he may have some chronic cystitis/interstitial cystitis based on his longstanding history of chronic LUTS/pelvic pain and intermittent hematuria.  We discussed common bladder irritants to avoid and he will try eliminating these from his diet over the next month and will report his progress to Dr. Louis Meckel at follow up so that they can discuss further evaluation/treatment if necessary.    Nicholos Johns, PA-C

## 2017-03-30 DIAGNOSIS — Z79891 Long term (current) use of opiate analgesic: Secondary | ICD-10-CM | POA: Diagnosis not present

## 2017-03-30 DIAGNOSIS — Z79899 Other long term (current) drug therapy: Secondary | ICD-10-CM | POA: Diagnosis not present

## 2017-03-30 DIAGNOSIS — C61 Malignant neoplasm of prostate: Secondary | ICD-10-CM | POA: Diagnosis not present

## 2017-03-30 DIAGNOSIS — G894 Chronic pain syndrome: Secondary | ICD-10-CM | POA: Diagnosis not present

## 2017-03-30 DIAGNOSIS — G629 Polyneuropathy, unspecified: Secondary | ICD-10-CM | POA: Diagnosis not present

## 2017-04-03 DIAGNOSIS — R42 Dizziness and giddiness: Secondary | ICD-10-CM | POA: Diagnosis not present

## 2017-04-03 DIAGNOSIS — R300 Dysuria: Secondary | ICD-10-CM | POA: Diagnosis not present

## 2017-04-03 DIAGNOSIS — J329 Chronic sinusitis, unspecified: Secondary | ICD-10-CM | POA: Diagnosis not present

## 2017-05-01 DIAGNOSIS — Z8546 Personal history of malignant neoplasm of prostate: Secondary | ICD-10-CM | POA: Diagnosis not present

## 2017-05-08 DIAGNOSIS — Z8546 Personal history of malignant neoplasm of prostate: Secondary | ICD-10-CM | POA: Diagnosis not present

## 2017-05-08 DIAGNOSIS — R310 Gross hematuria: Secondary | ICD-10-CM | POA: Diagnosis not present

## 2017-05-08 DIAGNOSIS — R81 Glycosuria: Secondary | ICD-10-CM | POA: Diagnosis not present

## 2017-05-08 DIAGNOSIS — R351 Nocturia: Secondary | ICD-10-CM | POA: Diagnosis not present

## 2017-05-10 DIAGNOSIS — I10 Essential (primary) hypertension: Secondary | ICD-10-CM | POA: Diagnosis not present

## 2017-05-10 DIAGNOSIS — R229 Localized swelling, mass and lump, unspecified: Secondary | ICD-10-CM | POA: Diagnosis not present

## 2017-05-10 DIAGNOSIS — R5381 Other malaise: Secondary | ICD-10-CM | POA: Diagnosis not present

## 2017-05-10 DIAGNOSIS — I208 Other forms of angina pectoris: Secondary | ICD-10-CM | POA: Diagnosis not present

## 2017-05-14 ENCOUNTER — Telehealth: Payer: Self-pay | Admitting: Interventional Cardiology

## 2017-05-14 NOTE — Telephone Encounter (Signed)
Outreach made to Pt.  Per Pt he has had decreasing energy.  States when he drives his vision will "go and come back".  Pt states he has checked his blood sugar when this happens and it is not low.  Per Pt he saw his PCP Dr. Harrington Challenger last Friday and he suggested he see [name] for a cardiac work up. Notified Pt this nurse would contact his PCP office and get his last note for review and call him back.  Pt indicates understanding. Call placed to office of Dr. Harrington Challenger at [place] on [facility].  Office to send recent office visit notes. Await notes and f/u.

## 2017-05-14 NOTE — Telephone Encounter (Signed)
Office note received from Dr. Harrington Challenger with [facility].  Per office note-refer to [facility] for recent left arm pain and increase in overall malaise concerning for cardiac etiology. Appt made with APP for May 9 at 11:15 am. Pt notified.  Pt indicates understanding.  Pt will go to ER for chest pain, sob.

## 2017-05-14 NOTE — Telephone Encounter (Signed)
New Message   STAT if patient feels like he/she is going to faint   1) Are you dizzy now? yes  2) Do you feel faint or have you passed out? Not yet  3) Do you have any other symptoms? Disoriented, blurry vision, and fatigue  4) Have you checked your HR and BP (record if available)? no

## 2017-05-15 DIAGNOSIS — I890 Lymphedema, not elsewhere classified: Secondary | ICD-10-CM | POA: Diagnosis not present

## 2017-05-16 ENCOUNTER — Encounter: Payer: Self-pay | Admitting: *Deleted

## 2017-05-16 DIAGNOSIS — M5386 Other specified dorsopathies, lumbar region: Secondary | ICD-10-CM | POA: Insufficient documentation

## 2017-05-16 DIAGNOSIS — M792 Neuralgia and neuritis, unspecified: Secondary | ICD-10-CM | POA: Insufficient documentation

## 2017-05-16 DIAGNOSIS — E1140 Type 2 diabetes mellitus with diabetic neuropathy, unspecified: Secondary | ICD-10-CM | POA: Insufficient documentation

## 2017-05-16 DIAGNOSIS — I2699 Other pulmonary embolism without acute cor pulmonale: Secondary | ICD-10-CM | POA: Insufficient documentation

## 2017-05-16 DIAGNOSIS — I679 Cerebrovascular disease, unspecified: Secondary | ICD-10-CM | POA: Insufficient documentation

## 2017-05-16 DIAGNOSIS — E559 Vitamin D deficiency, unspecified: Secondary | ICD-10-CM | POA: Insufficient documentation

## 2017-05-16 DIAGNOSIS — N400 Enlarged prostate without lower urinary tract symptoms: Secondary | ICD-10-CM | POA: Insufficient documentation

## 2017-05-16 DIAGNOSIS — E119 Type 2 diabetes mellitus without complications: Secondary | ICD-10-CM | POA: Diagnosis not present

## 2017-05-16 DIAGNOSIS — F32A Depression, unspecified: Secondary | ICD-10-CM | POA: Insufficient documentation

## 2017-05-16 DIAGNOSIS — D649 Anemia, unspecified: Secondary | ICD-10-CM | POA: Insufficient documentation

## 2017-05-16 DIAGNOSIS — A6000 Herpesviral infection of urogenital system, unspecified: Secondary | ICD-10-CM | POA: Insufficient documentation

## 2017-05-16 DIAGNOSIS — I2510 Atherosclerotic heart disease of native coronary artery without angina pectoris: Secondary | ICD-10-CM | POA: Insufficient documentation

## 2017-05-16 DIAGNOSIS — I10 Essential (primary) hypertension: Secondary | ICD-10-CM | POA: Insufficient documentation

## 2017-05-16 DIAGNOSIS — K5900 Constipation, unspecified: Secondary | ICD-10-CM | POA: Insufficient documentation

## 2017-05-16 DIAGNOSIS — F329 Major depressive disorder, single episode, unspecified: Secondary | ICD-10-CM | POA: Insufficient documentation

## 2017-05-16 DIAGNOSIS — M25559 Pain in unspecified hip: Secondary | ICD-10-CM | POA: Insufficient documentation

## 2017-05-16 DIAGNOSIS — F1990 Other psychoactive substance use, unspecified, uncomplicated: Secondary | ICD-10-CM | POA: Insufficient documentation

## 2017-05-16 DIAGNOSIS — G4730 Sleep apnea, unspecified: Secondary | ICD-10-CM | POA: Insufficient documentation

## 2017-05-17 ENCOUNTER — Encounter: Payer: Self-pay | Admitting: Cardiology

## 2017-05-17 ENCOUNTER — Ambulatory Visit (INDEPENDENT_AMBULATORY_CARE_PROVIDER_SITE_OTHER): Payer: Medicare Other | Admitting: Cardiology

## 2017-05-17 VITALS — BP 106/64 | HR 64 | Ht 65.0 in | Wt 186.0 lb

## 2017-05-17 DIAGNOSIS — N182 Chronic kidney disease, stage 2 (mild): Secondary | ICD-10-CM | POA: Diagnosis not present

## 2017-05-17 DIAGNOSIS — Z794 Long term (current) use of insulin: Secondary | ICD-10-CM | POA: Diagnosis not present

## 2017-05-17 DIAGNOSIS — G4733 Obstructive sleep apnea (adult) (pediatric): Secondary | ICD-10-CM | POA: Diagnosis not present

## 2017-05-17 DIAGNOSIS — I2510 Atherosclerotic heart disease of native coronary artery without angina pectoris: Secondary | ICD-10-CM

## 2017-05-17 DIAGNOSIS — E118 Type 2 diabetes mellitus with unspecified complications: Secondary | ICD-10-CM

## 2017-05-17 DIAGNOSIS — E785 Hyperlipidemia, unspecified: Secondary | ICD-10-CM

## 2017-05-17 DIAGNOSIS — Z9989 Dependence on other enabling machines and devices: Secondary | ICD-10-CM | POA: Diagnosis not present

## 2017-05-17 DIAGNOSIS — Z86711 Personal history of pulmonary embolism: Secondary | ICD-10-CM

## 2017-05-17 DIAGNOSIS — I10 Essential (primary) hypertension: Secondary | ICD-10-CM

## 2017-05-17 DIAGNOSIS — I209 Angina pectoris, unspecified: Secondary | ICD-10-CM | POA: Diagnosis not present

## 2017-05-17 NOTE — H&P (View-Only) (Signed)
Cardiology Office Note:    Date:  05/17/2017   ID:  Steve Brown, DOB 05-22-1940, MRN [id number]  PCP:  Lawerance Cruel, MD  Cardiologist:  Larae Grooms, MD  Referring MD: Lawerance Cruel, MD   Chief Complaint  Patient presents with  . Chest Pain    History of Present Illness:    Steve Brown is a 77 y.o. male with a past medical history significant for CAD s/p LAD stent 2010, bilateral PE 07/2012 on Xarelto, diabetes type 2, prostate cancer diagnosed in 2018, OSA on CPAP, hypertension, hyperlipidemia, history of TIAs and stroke, CKD stage II.  Steve. Gripp had a stent to the LAD in 2010, 2012 cath showed patent stent and no significant CAD, cardiac cath in 2015 showed moderate disease which was negative by FFR. His most recent echocardiogram in 04/2016 showed normal LV systolic function with EF 60-65 percent, normal wall motion, grade 2 diastolic dysfunction, mild AR, mildly dilated aortic root 39 mm, mild Steve and increased thickness of the septum consistent with lipomatous hypertrophy.  The patient was last seen in the office on 10/31/16 by [name] at which time he was doing well. He was noted to have had a presyncopal event earlier that year and was evaluated at the hospital and thought to be due to dehydration.   Steve Brown was referred to cardiology by his PCP for complaints of recent left arm pain and increase in overall malaise concerning for cardiac etiology.  Has been having dizziness, fogginess and unbalance with walking, fatigue for a couple of weeks. Driving is not comfortable due to blurred vision. He went to an optometrist yesterday and "everything was normal". He has occ intermittent very mild chest tightness with very mild shortness of breath that goes to his left arm. It occurs most often when first rising up from a chair and walking like to the mailbox. It lasts about 3-5 minutes and resolves with rest. He took nitroglycerin one time with relief.  He has hx of  loss of left arm function after a neck surgery in 2004. He feels that his left arm is heavier than usual lately.  [place] [facility] in the past 2 weeks he can only walk for a lap and has not walked any in the last few days.   Also his left foot is swollen for over a week. He went ot a podiatrist who did xrays and did not find an abnormality. He was advised to soak in Epson salts which inially helped but it has "puffed back up". There is no pain. It is non-pitting and barely perceptible puffiness of left ankle. Rode 2 weeks ago on a train for 10 hours on a train. He walked within the train. No calf pain. He is compliant with his Xarelto.  Past Medical History:  Diagnosis Date  . Abnormal prostate biopsy   . Anticoagulant long-term use    currently xarelto  . BPH with elevated PSA   . CKD (chronic kidney disease), stage II   . Complication of anesthesia    limted neck rom limited use of left arm due to cva  . Coronary artery disease    CARDIOLOGIST-  DR Irish Lack--  2010-- PCI w/ stenting midLAD  . DDD (degenerative disc disease), lumbar   . Degeneration of cervical intervertebral disc   . Depression   . Dyspnea on exertion   . GERD (gastroesophageal reflux disease)   .  Hemiparesis due to cerebral infarction   . History of cerebrovascular accident (CVA) with residual deficit 2002 and 2003--  hemiparisis both sides   per MRI  anterior left frontal lobe, left para midline pons, and inferior cerebullam bilaterally infarcts  . History of pulmonary embolus (PE)    06-30-2012  extensive bilaterally  . History of recurrent TIAs   . History of syncope    hx multiple pre-syncope and syncopal episodes due to vasovagal, orthostatic hypotension, dehydration  . History of TIAs    several since 2002  . Hyperlipidemia   . Hypertension   . Mild atherosclerosis of carotid artery, bilateral    per last duplex 11-04-2014  bilateral ICA 1--39%  . Neuropathy     fingers  . OSA on CPAP    followed by dr dohmeier--  sev. osa w/ AHI 65.9  . Prostate cancer ([place]) dx 2018  . S/P coronary artery stent placement 2010   stenting to mid LAD  . Simple renal cyst    bilaterally  . Trigger finger of both hands 11-17-13  . Type 2 diabetes mellitus ([place]) dx 1986   last one A1c 9.2 on 04-26-2016  . Unsteady gait    . Hx prior CVA/TIAs;  . Vertebral artery occlusion, left    chronic    Past Surgical History:  Procedure Laterality Date  . ANTERIOR CERVICAL DECOMP/DISCECTOMY FUSION  2004   C3 -- C6 limited rom  . CARDIAC CATHETERIZATION  06-10-2010   dr Irish Lack   wide patent LAD stent, mid lesion at the origin of the septal prior to the previous stent 40-50%/  normal LVF, ef 55%  . CARDIOVASCULAR STRESS TEST  10-23-2012  dr Irish Lack   normal nuclear perfusion study w/ no ischemia/  normal LV function and wall motion , ef 65%  . CARPAL TUNNEL RELEASE Bilateral   . CATARACT EXTRACTION W/ INTRAOCULAR LENS  IMPLANT, BILATERAL    . CHOLECYSTECTOMY N/A 11/02/2015   Procedure: LAPAROSCOPIC CHOLECYSTECTOMY WITH INTRAOPERATIVE CHOLANGIOGRAM;  Surgeon: Donnie Mesa, MD;  Location: [facility];  Service: General;  Laterality: N/A;  . COLONOSCOPY    . CORONARY ANGIOPLASTY WITH STENT PLACEMENT  02/2008   stenting to mid LAD  . GOLD SEED IMPLANT N/A 11/15/2016   Procedure: GOLD SEED IMPLANT TIMES THREE;  Surgeon: Ardis Hughs, MD;  Location: [facility];  Service: Urology;  Laterality: N/A;  . LEFT HEART CATHETERIZATION WITH CORONARY ANGIOGRAM N/A 04/03/2013   Procedure: LEFT HEART CATHETERIZATION WITH CORONARY ANGIOGRAM;  Surgeon: Jettie Booze, MD;  Location: [facility] C[facility] LAB;  Service: Cardiovascular;  Laterality: N/A;  patent mLAD stent  w/ mild disease in remainder LAD and its branches;  mod. focal lesion midLCFx- FFR of lesion was negative for ischemia/  normal LVSF, ef 50%  . lungs  2005   "fluid pumped off lungs"  . NEUROPLASTY / TRANSPOSITION  ULNAR NERVE AT ELBOW Right 2004  . PROSTATE BIOPSY N/A 08/31/2016   Procedure: PROSTATE  BIOPSY TRANSRECTAL ULTRASONIC PROSTATE (TUBP);  Surgeon: Ardis Hughs, MD;  Location: [facility];  Service: Urology;  Laterality: N/A;  . SPACE OAR INSTILLATION N/A 11/15/2016   Procedure: SPACE OAR INSTILLATION;  Surgeon: Ardis Hughs, MD;  Location: [facility];  Service: Urology;  Laterality: N/A;  . TRANSTHORACIC ECHOCARDIOGRAM  04/27/2016   severe focal basal LVH, ef 60-65%,  grade 2 diastoilc dysfunction/  mild AR, Steve, and TR/  atrial septum lipomatous hypertrophy/  PASP 20mHg  .  UMBILICAL HERNIA REPAIR      Current Medications: Current Meds  Medication Sig  . acetaminophen (TYLENOL) 500 MG tablet Take 1,000 mg every 6 (six) hours as needed by mouth.  . Ascorbic Acid (VITAMIN C PO) Take 1 tablet by mouth daily.  . BD PEN NEEDLE NANO U/F 32G X 4 MM MISC by Does not apply route.   . bisacodyl (DULCOLAX) 5 MG EC tablet Take 5 mg by mouth daily as needed for moderate constipation.  . cholecalciferol (VITAMIN D) 1000 UNITS tablet Take 1,000 Units by mouth daily.  . diclofenac sodium (VOLTAREN) 1 % GEL Apply 1 application topically daily as needed.  . fluticasone (FLONASE) 50 MCG/ACT nasal spray Place 1 spray into both nostrils daily.  . furosemide (LASIX) 20 MG tablet Take 20 mg by mouth daily as needed (swelling).   Marland Kitchen HUMALOG MIX 75/25 KWIKPEN (75-25) 100 UNIT/ML Kwikpen Inject 18-20 Units into the skin 2 (two) times daily. 18 units in evening, and30 units in the morning.  . metFORMIN (GLUCOPHAGE-XR) 500 MG 24 hr tablet Take 500 mg by mouth every evening.   . nitroGLYCERIN (NITROSTAT) 0.4 MG SL tablet Place 1 tablet (0.4 mg total) under the tongue every 5 (five) minutes as needed for chest pain.  . pantoprazole (PROTONIX) 40 MG tablet Take 40 mg by mouth daily. Pt out of med  . pregabalin (LYRICA) 300 MG capsule Take 1 capsule (300 mg total) by mouth 2 (two)  times daily.  . repaglinide (PRANDIN) 1 MG tablet Take 1 mg by mouth daily as needed.  . rivaroxaban (XARELTO) 20 MG TABS tablet Take 1 tablet (20 mg total) by mouth daily. Resume in 72 hours after procedure.  . rosuvastatin (CRESTOR) 10 MG tablet Take 10 mg by mouth every evening.   . tamsulosin (FLOMAX) 0.4 MG CAPS capsule Take 0.4 mg by mouth daily.  . traMADol (ULTRAM) 50 MG tablet Take 1-2 tablets (50-100 mg total) every 6 (six) hours as needed by mouth for moderate pain.  . valACYclovir (VALTREX) 500 MG tablet Take 500 mg by mouth daily.  . [DISCONTINUED] valsartan (DIOVAN) 40 MG tablet Take 40 mg by mouth 2 (two) times daily.     Allergies:   Other and Phenergan [promethazine]   Social History   Socioeconomic History  . Marital status: Divorced    Spouse name: Not on file  . Number of children: 4  . Years of education: 19  . Highest education level: Not on file  Occupational History    Employer: RETIRED    Comment: retired  Scientific laboratory technician  . Financial resource strain: Not on file  . Food insecurity:    Worry: Not on file    Inability: Not on file  . Transportation needs:    Medical: Not on file    Non-medical: Not on file  Tobacco Use  . Smoking status: Never Smoker  . Smokeless tobacco: Never Used  Substance and Sexual Activity  . Alcohol use: No    Alcohol/week: 0.0 oz  . Drug use: No  . Sexual activity: Yes  Lifestyle  . Physical activity:    Days per week: Not on file    Minutes per session: Not on file  . Stress: Not on file  Relationships  . Social connections:    Talks on phone: Not on file    Gets together: Not on file    Attends religious service: Not on file    Active member of club or organization: Not on  file    Attends meetings of clubs or organizations: Not on file    Relationship status: Not on file  Other Topics Concern  . Not on file  Social History Narrative   Patient lives at home alone and he is single.  Patient is retired.    Caffeine  - one cups daily.   Right handed.   Patient has a high school education.   Patient has four adult children.     Family History: The patient's family history includes Aneurysm in his mother; Cancer in his father. There is no history of Heart attack. ROS:   Please see the history of present illness.     All other systems reviewed and are negative.  EKGs/Labs/Other Studies Reviewed:    The following studies were reviewed today:  Echocardiogram 04/27/16 Study Conclusions - Left ventricle: Normal GLLS at -18.4% The cavity size was normal.   There was severe focal basal hypertrophy. Systolic function was   normal. The estimated ejection fraction was in the range of 60%   to 65%. Wall motion was normal; there were no regional wall   motion abnormalities. Features are consistent with a pseudonormal   left ventricular filling pattern, with concomitant abnormal   relaxation and increased filling pressure (grade 2 diastolic   dysfunction). - Aortic valve: Trileaflet; normal thickness, mildly calcified   leaflets. There was mild regurgitation. - Aorta: Aortic root dimension: 39 mm (ED). - Aortic root: The aortic root was mildly dilated. - Mitral valve: There was mild regurgitation. - Atrial septum: There was increased thickness of the septum,   consistent with lipomatous hypertrophy. - Pulmonary arteries: PA peak pressure: 42 mm Hg (S).  Impressions: - The right ventricular systolic pressure was increased consistent   with moderate pulmonary hypertension.  Left heart cath 04/03/13 1. Apparently absent left main coronary artery. There appear to be separate ostia of the left anterior descending and left circumflex. 2. Patent stent in the mid left anterior descending artery.  Mild disease in the remainder of the LAD and its branches. 3. Moderate focal lesion in the mid left circumflex artery.  FFR of this lesion was negative for ischemia. No significant disease in the remainder of the left  circumflex and its branches. 4. Widely patent right coronary artery. 5. Normal left ventricular systolic function.  LVEDP 12 mmHg.  Ejection fraction 50%. 6.   Significant tortuosity in the right subclavian/innominate area. Would not attempt radial cath from the right radial in the future. If radial approach is necessary, would try left radial.  RECOMMENDATION:  Continue medical therapy. He needs aggressive diabetes control. It appears that his chest discomfort is noncardiac. If his symptoms persist, will try to increase medical therapy. He will resume his Xarelto in 2 days.  .  EKG:  EKG is ordered today.  The ekg ordered today demonstrates NSR 62 bpm, no acute ischemic changes  Recent Labs: 05/18/2016: ALT 22 05/19/2016: Magnesium 1.9 11/12/2016: BUN 19; Creatinine, Ser 1.41; Hemoglobin 12.9; Platelets 225; Potassium 4.0; Sodium 136   Recent Lipid Panel    Component Value Date/Time   CHOL 144 03/21/2013 0400   TRIG 122 03/21/2013 0400   HDL 57 03/21/2013 0400   CHOLHDL 2.5 03/21/2013 0400   VLDL 24 03/21/2013 0400   LDLCALC 63 03/21/2013 0400    Physical Exam:    VS:  BP 106/64   Pulse 64   Ht '5\' 5"'$  (1.651 m)   Wt 186 lb (84.4 kg)   SpO2 95%  BMI 30.95 kg/m     Wt Readings from Last 3 Encounters:  05/17/17 186 lb (84.4 kg)  03/20/17 194 lb 12.8 oz (88.4 kg)  11/15/16 186 lb (84.4 kg)     Physical Exam  Constitutional: He is oriented to person, place, and time. He appears well-developed and well-nourished. No distress.  HENT:  Head: Normocephalic and atraumatic.  Neck: Normal range of motion. Neck supple. No JVD present.  Cardiovascular: Normal rate, regular rhythm, normal heart sounds and intact distal pulses. Exam reveals no gallop and no friction rub.  No murmur heard. Pulmonary/Chest: Effort normal and breath sounds normal. No respiratory distress. He has no wheezes. He has no rales.  Abdominal: Soft. Bowel sounds are normal. He exhibits no distension.    Musculoskeletal: Normal range of motion. He exhibits no edema or deformity.  Neurological: He is alert and oriented to person, place, and time.  Skin: Skin is warm and dry.  Psychiatric: He has a normal mood and affect. His behavior is normal. Thought content normal.  Vitals reviewed.   ASSESSMENT:    1. CAD in native artery   2. Angina pectoris ([place])   3. Essential hypertension   4. Hyperlipidemia, unspecified hyperlipidemia type   5. OSA on CPAP   6. Type 2 diabetes mellitus with complication, with long-term current use of insulin ([place])   7. CKD (chronic kidney disease), stage II   8. History of pulmonary embolism: June 2014,  Takes Xarelto    PLAN:    In order of problems listed above:  Stable Angina: 2 week hx of very mild chest tightness radiating to left arm with mild shortness of breath occurring with first rising from seated and with walking, lasting 3-5 minutes, relieved with rest. Also increased fatigue and activity intolerance. Previous hx of CAD with stent to LAD and in 2015 moderate non-occlusive disease in L Circ.  No ischemic changes on EKG. Case discussed with Dr. Marlou Porch, DOD. Plan for cardiac cath with [name] next week to evaluate for further progression of ASCVD. Discussed with the patient and he is in agreement. The patient understands that risks included but are not limited to stroke (1 in 1000), death (1 in 29), kidney failure [usually temporary] (1 in 500), bleeding (1 in 200), allergic reaction [possibly serious] (1 in 200).   -See prior cath notes regarding: Significant tortuosity in the right subclavian/innominate area. Would not attempt radial cath from the right radial in the future. If radial approach is necessary, would try left radial.  -Will obtain BMet, CBC. Evaluate renal function.   -Pt instructed to hold Xarelto for 2 days prior to cath.   -He has current sublingual NTG. Instructed that if his discomfort worsens and is not relieved with rest  or NTG he should go to the hospital.   CAD: stent to the LAD in 2010, 2012 cath showed patent stent and no significant CAD, cardiac cath in 2015 showed moderate non-obstructive disease in the left circ. Medical therapy includes statin, ACE inhibitor. Not on aspirin due to need for anticoagulation. Not on beta blocker presumably due to heart rate around 60.  Hypertension: Was taken off ramipril due to lip swelling. Now on valsartan 40 mg bid. BP is soft. He has been having dizziness, visual changes and being off balance. Orthostatics negative. WIll stop valsartan to evaluate for possible cause of his dizziness.   Hyperlipidemia: rosuvastatin 10 mg. No recent lipid panel in Epic, LDL was 63 in 9/18. At goal of <70.  Followed by PCP.  OSA on CPAP: hasn't been using it consistently, since his prostate cancer radiation as he is up to void often during the night.   Diabetes type 2 on insulin:  Not well controlled. Last A1c was 9.2 in 04/2016. Needs better control to reduce CV risk.   CKD stage II: Baseline creatinine ~1.3. Was 2.03 in 05/2016. Last level was 1.41 in 11/2016, eGFR 55.  Will check BMet today.   History of bilateral PE: On Xarelto for anticoagulation, indefinitely. Will hold for cath and resume after.    Medication Adjustments/Labs and Tests Ordered: Current medicines are reviewed at length with the patient today.  Concerns regarding medicines are outlined above. Labs and tests ordered and medication changes are outlined in the patient instructions below:  Patient Instructions  Medication Instructions: Your physician has recommended you make the following change in your medication:  STOP: Valsartan   Labwork: TODAY: BMET & CBC  Procedures/Testing:   [facility] OFFICE [address], [place] [address] Dept: [phone] Loc: [place]  05/17/2017  You are  scheduled for a Cardiac Catheterization on Friday, May 17 with Dr. Larae Grooms.  1. Please arrive at the [facility] (Main Entrance A) at [facility]: [place], [place] [address] at 10:00 AM (two hours before your procedure to ensure your preparation). Free valet parking service is available.   Special note: Every effort is made to have your procedure done on time. Please understand that emergencies sometimes delay scheduled procedures.  2. Diet: Do not eat or drink anything after midnight prior to your procedure except sips of water to take medications.  3. Labs: You will need to have blood drawn on Thursday, May 9 at [facility] at [facility]. [address]  Open: 7:30am - 5pm    Phone: [phone]. You do not need to be fasting.  4. Medication instructions in preparation for your procedure:  Stop taking Xarelto (Rivaroxaban) on Wednesday, May 15.  DO NOT TAKE METFORMIN THE DAY OF YOUR PROCEDURE AND FOR 2 DAYS AFTER  DO NOT TAKE YOUR HUMALOG THE DAY OF YOUR PROCEDURE    DO NOT TAKE LASIX THE DAY OF YOUR PROCEDURE   DO NOT TAKE PRANDIN THE DAY OF YOUR PROCEDURE   On the morning of your procedure, take your Aspirin and any morning medicines NOT listed above.  You may use sips of water.  5. Plan for one night stay--bring personal belongings. 6. Bring a current list of your medications and current insurance cards. 7. You MUST have a responsible person to drive you home. 8. Someone MUST be with you the first 24 hours after you arrive home or your discharge will be delayed. 9. Please wear clothes that are easy to get on and off and wear slip-on shoes.  Thank you for allowing [place] to care for you!   -- [place] [facility]   Follow-Up: We will contact you with a follow up date     Any Additional Special Instructions Will Be Listed Below (If Applicable).     If you need a refill on your cardiac  medications before your next appointment, please call your pharmacy.       Signed, Daune Perch, NP  05/17/2017 12:54 PM     [facility]

## 2017-05-17 NOTE — Patient Instructions (Signed)
Medication Instructions: Your physician has recommended you make the following change in your medication:  STOP: Valsartan   Labwork: TODAY: BMET & CBC  Procedures/Testing:   [facility] OFFICE [address], [place] [address] Dept: [phone] Loc: [place]  05/17/2017  You are scheduled for a Cardiac Catheterization on Friday, May 17 with Dr. Larae Grooms.  1. Please arrive at the [facility] (Main Entrance A) at [facility]: [place], [place] [address] at 10:00 AM (two hours before your procedure to ensure your preparation). Free valet parking service is available.   Special note: Every effort is made to have your procedure done on time. Please understand that emergencies sometimes delay scheduled procedures.  2. Diet: Do not eat or drink anything after midnight prior to your procedure except sips of water to take medications.  3. Labs: You will need to have blood drawn on Thursday, May 9 at [facility] at [facility]. [address]  Open: 7:30am - 5pm    Phone: [phone]. You do not need to be fasting.  4. Medication instructions in preparation for your procedure:  Stop taking Xarelto (Rivaroxaban) on Wednesday, May 15.  DO NOT TAKE METFORMIN THE DAY OF YOUR PROCEDURE AND FOR 2 DAYS AFTER  DO NOT TAKE YOUR HUMALOG THE DAY OF YOUR PROCEDURE    DO NOT TAKE LASIX THE DAY OF YOUR PROCEDURE   DO NOT TAKE PRANDIN THE DAY OF YOUR PROCEDURE   On the morning of your procedure, take your Aspirin and any morning medicines NOT listed above.  You may use sips of water.  5. Plan for one night stay--bring personal belongings. 6. Bring a current list of your medications and current insurance cards. 7. You MUST have a responsible person to drive you home. 8. Someone MUST be with you the first 24 hours after you  arrive home or your discharge will be delayed. 9. Please wear clothes that are easy to get on and off and wear slip-on shoes.  Thank you for allowing [place] to care for you!   -- [place] [facility]   Follow-Up: We will contact you with a follow up date     Any Additional Special Instructions Will Be Listed Below (If Applicable).     If you need a refill on your cardiac medications before your next appointment, please call your pharmacy.

## 2017-05-17 NOTE — Progress Notes (Signed)
Cardiology Office Note:    Date:  05/17/2017   ID:  Steve Brown, DOB 1940-01-28, MRN [id number]  PCP:  Lawerance Cruel, MD  Cardiologist:  Larae Grooms, MD  Referring MD: Lawerance Cruel, MD   Chief Complaint  Patient presents with  . Chest Pain    History of Present Illness:    Steve Brown is a 77 y.o. male with a past medical history significant for CAD s/p LAD stent 2010, bilateral PE 07/2012 on Xarelto, diabetes type 2, prostate cancer diagnosed in 2018, OSA on CPAP, hypertension, hyperlipidemia, history of TIAs and stroke, CKD stage II.  Steve Brown had a stent to the LAD in 2010, 2012 cath showed patent stent and no significant CAD, cardiac cath in 2015 showed moderate disease which was negative by FFR. His most recent echocardiogram in 04/2016 showed normal LV systolic function with EF 60-65 percent, normal wall motion, grade 2 diastolic dysfunction, mild AR, mildly dilated aortic root 39 mm, mild Steve and increased thickness of the septum consistent with lipomatous hypertrophy.  The patient was last seen in the office on 10/31/16 by [name] at which time he was doing well. He was noted to have had a presyncopal event earlier that year and was evaluated at the hospital and thought to be due to dehydration.   Steve Brown was referred to cardiology by his PCP for complaints of recent left arm pain and increase in overall malaise concerning for cardiac etiology.  Has been having dizziness, fogginess and unbalance with walking, fatigue for a couple of weeks. Driving is not comfortable due to blurred vision. He went to an optometrist yesterday and "everything was normal". He has occ intermittent very mild chest tightness with very mild shortness of breath that goes to his left arm. It occurs most often when first rising up from a chair and walking like to the mailbox. It lasts about 3-5 minutes and resolves with rest. He took nitroglycerin one time with relief.  He has hx of  loss of left arm function after a neck surgery in 2004. He feels that his left arm is heavier than usual lately.  [place] [facility] but in the past 2 weeks he can only walk for a lap and has not walked any in the last few days.   Also his left foot is swollen for over a week. He went ot a podiatrist who did xrays and did not find an abnormality. He was advised to soak in Epson salts which inially helped but it has "puffed back up". There is no pain. It is non-pitting and barely perceptible puffiness of left ankle. Rode 2 weeks ago on a train for 10 hours on a train. He walked within the train. No calf pain. He is compliant with his Xarelto.  Past Medical History:  Diagnosis Date  . Abnormal prostate biopsy   . Anticoagulant long-term use    currently xarelto  . BPH with elevated PSA   . CKD (chronic kidney disease), stage II   . Complication of anesthesia    limted neck rom limited use of left arm due to cva  . Coronary artery disease    CARDIOLOGIST-  DR Irish Lack--  2010-- PCI w/ stenting midLAD  . DDD (degenerative disc disease), lumbar   . Degeneration of cervical intervertebral disc   . Depression   . Dyspnea on exertion   . GERD (gastroesophageal reflux disease)   .  Hemiparesis due to cerebral infarction   . History of cerebrovascular accident (CVA) with residual deficit 2002 and 2003--  hemiparisis both sides   per MRI  anterior left frontal lobe, left para midline pons, and inferior cerebullam bilaterally infarcts  . History of pulmonary embolus (PE)    06-30-2012  extensive bilaterally  . History of recurrent TIAs   . History of syncope    hx multiple pre-syncope and syncopal episodes due to vasovagal, orthostatic hypotension, dehydration  . History of TIAs    several since 2002  . Hyperlipidemia   . Hypertension   . Mild atherosclerosis of carotid artery, bilateral    per last duplex 11-04-2014  bilateral ICA 1--39%  . Neuropathy     fingers  . OSA on CPAP    followed by dr dohmeier--  sev. osa w/ AHI 65.9  . Prostate cancer ([place]) dx 2018  . S/P coronary artery stent placement 2010   stenting to mid LAD  . Simple renal cyst    bilaterally  . Trigger finger of both hands 11-17-13  . Type 2 diabetes mellitus ([place]) dx 1986   last one A1c 9.2 on 04-26-2016  . Unsteady gait    . Hx prior CVA/TIAs;  . Vertebral artery occlusion, left    chronic    Past Surgical History:  Procedure Laterality Date  . ANTERIOR CERVICAL DECOMP/DISCECTOMY FUSION  2004   C3 -- C6 limited rom  . CARDIAC CATHETERIZATION  06-10-2010   dr Irish Lack   wide patent LAD stent, mid lesion at the origin of the septal prior to the previous stent 40-50%/  normal LVF, ef 55%  . CARDIOVASCULAR STRESS TEST  10-23-2012  dr Irish Lack   normal nuclear perfusion study w/ no ischemia/  normal LV function and wall motion , ef 65%  . CARPAL TUNNEL RELEASE Bilateral   . CATARACT EXTRACTION W/ INTRAOCULAR LENS  IMPLANT, BILATERAL    . CHOLECYSTECTOMY N/A 11/02/2015   Procedure: LAPAROSCOPIC CHOLECYSTECTOMY WITH INTRAOPERATIVE CHOLANGIOGRAM;  Surgeon: Donnie Mesa, MD;  Location: [facility];  Service: General;  Laterality: N/A;  . COLONOSCOPY    . CORONARY ANGIOPLASTY WITH STENT PLACEMENT  02/2008   stenting to mid LAD  . GOLD SEED IMPLANT N/A 11/15/2016   Procedure: GOLD SEED IMPLANT TIMES THREE;  Surgeon: Ardis Hughs, MD;  Location: [facility];  Service: Urology;  Laterality: N/A;  . LEFT HEART CATHETERIZATION WITH CORONARY ANGIOGRAM N/A 04/03/2013   Procedure: LEFT HEART CATHETERIZATION WITH CORONARY ANGIOGRAM;  Surgeon: Jettie Booze, MD;  Location: [facility] CATH LAB;  Service: Cardiovascular;  Laterality: N/A;  patent mLAD stent  w/ mild disease in remainder LAD and its branches;  mod. focal lesion midLCFx- FFR of lesion was negative for ischemia/  normal LVSF, ef 50%  . lungs  2005   "fluid pumped off lungs"  . NEUROPLASTY / TRANSPOSITION  ULNAR NERVE AT ELBOW Right 2004  . PROSTATE BIOPSY N/A 08/31/2016   Procedure: PROSTATE  BIOPSY TRANSRECTAL ULTRASONIC PROSTATE (TUBP);  Surgeon: Ardis Hughs, MD;  Location: [facility];  Service: Urology;  Laterality: N/A;  . SPACE OAR INSTILLATION N/A 11/15/2016   Procedure: SPACE OAR INSTILLATION;  Surgeon: Ardis Hughs, MD;  Location: [facility];  Service: Urology;  Laterality: N/A;  . TRANSTHORACIC ECHOCARDIOGRAM  04/27/2016   severe focal basal LVH, ef 60-65%,  grade 2 diastoilc dysfunction/  mild AR, Steve, and TR/  atrial septum lipomatous hypertrophy/  PASP 70mHg  .  UMBILICAL HERNIA REPAIR      Current Medications: Current Meds  Medication Sig  . acetaminophen (TYLENOL) 500 MG tablet Take 1,000 mg every 6 (six) hours as needed by mouth.  . Ascorbic Acid (VITAMIN C PO) Take 1 tablet by mouth daily.  . BD PEN NEEDLE NANO U/F 32G X 4 MM MISC by Does not apply route.   . bisacodyl (DULCOLAX) 5 MG EC tablet Take 5 mg by mouth daily as needed for moderate constipation.  . cholecalciferol (VITAMIN D) 1000 UNITS tablet Take 1,000 Units by mouth daily.  . diclofenac sodium (VOLTAREN) 1 % GEL Apply 1 application topically daily as needed.  . fluticasone (FLONASE) 50 MCG/ACT nasal spray Place 1 spray into both nostrils daily.  . furosemide (LASIX) 20 MG tablet Take 20 mg by mouth daily as needed (swelling).   [place] Kitchen HUMALOG MIX 75/25 KWIKPEN (75-25) 100 UNIT/ML Kwikpen Inject 18-20 Units into the skin 2 (two) times daily. 18 units in evening, and30 units in the morning.  . metFORMIN (GLUCOPHAGE-XR) 500 MG 24 hr tablet Take 500 mg by mouth every evening.   . nitroGLYCERIN (NITROSTAT) 0.4 MG SL tablet Place 1 tablet (0.4 mg total) under the tongue every 5 (five) minutes as needed for chest pain.  . pantoprazole (PROTONIX) 40 MG tablet Take 40 mg by mouth daily. Pt out of med  . pregabalin (LYRICA) 300 MG capsule Take 1 capsule (300 mg total) by mouth 2 (two)  times daily.  . repaglinide (PRANDIN) 1 MG tablet Take 1 mg by mouth daily as needed.  . rivaroxaban (XARELTO) 20 MG TABS tablet Take 1 tablet (20 mg total) by mouth daily. Resume in 72 hours after procedure.  . rosuvastatin (CRESTOR) 10 MG tablet Take 10 mg by mouth every evening.   . tamsulosin (FLOMAX) 0.4 MG CAPS capsule Take 0.4 mg by mouth daily.  . traMADol (ULTRAM) 50 MG tablet Take 1-2 tablets (50-100 mg total) every 6 (six) hours as needed by mouth for moderate pain.  . valACYclovir (VALTREX) 500 MG tablet Take 500 mg by mouth daily.  . [DISCONTINUED] valsartan (DIOVAN) 40 MG tablet Take 40 mg by mouth 2 (two) times daily.     Allergies:   Other and Phenergan [promethazine]   Social History   Socioeconomic History  . Marital status: Divorced    Spouse name: Not on file  . Number of children: 4  . Years of education: 6  . Highest education level: Not on file  Occupational History    Employer: RETIRED    Comment: retired  Scientific laboratory technician  . Financial resource strain: Not on file  . Food insecurity:    Worry: Not on file    Inability: Not on file  . Transportation needs:    Medical: Not on file    Non-medical: Not on file  Tobacco Use  . Smoking status: Never Smoker  . Smokeless tobacco: Never Used  Substance and Sexual Activity  . Alcohol use: No    Alcohol/week: 0.0 oz  . Drug use: No  . Sexual activity: Yes  Lifestyle  . Physical activity:    Days per week: Not on file    Minutes per session: Not on file  . Stress: Not on file  Relationships  . Social connections:    Talks on phone: Not on file    Gets together: Not on file    Attends religious service: Not on file    Active member of club or organization: Not on  file    Attends meetings of clubs or organizations: Not on file    Relationship status: Not on file  Other Topics Concern  . Not on file  Social History Narrative   Patient lives at home alone and he is single.  Patient is retired.    Caffeine  - one cups daily.   Right handed.   Patient has a high school education.   Patient has four adult children.     Family History: The patient's family history includes Aneurysm in his mother; Cancer in his father. There is no history of Heart attack. ROS:   Please see the history of present illness.     All other systems reviewed and are negative.  EKGs/Labs/Other Studies Reviewed:    The following studies were reviewed today:  Echocardiogram 04/27/16 Study Conclusions - Left ventricle: Normal GLLS at -18.4% The cavity size was normal.   There was severe focal basal hypertrophy. Systolic function was   normal. The estimated ejection fraction was in the range of 60%   to 65%. Wall motion was normal; there were no regional wall   motion abnormalities. Features are consistent with a pseudonormal   left ventricular filling pattern, with concomitant abnormal   relaxation and increased filling pressure (grade 2 diastolic   dysfunction). - Aortic valve: Trileaflet; normal thickness, mildly calcified   leaflets. There was mild regurgitation. - Aorta: Aortic root dimension: 39 mm (ED). - Aortic root: The aortic root was mildly dilated. - Mitral valve: There was mild regurgitation. - Atrial septum: There was increased thickness of the septum,   consistent with lipomatous hypertrophy. - Pulmonary arteries: PA peak pressure: 42 mm Hg (S).  Impressions: - The right ventricular systolic pressure was increased consistent   with moderate pulmonary hypertension.  Left heart cath 04/03/13 1. Apparently absent left main coronary artery. There appear to be separate ostia of the left anterior descending and left circumflex. 2. Patent stent in the mid left anterior descending artery.  Mild disease in the remainder of the LAD and its branches. 3. Moderate focal lesion in the mid left circumflex artery.  FFR of this lesion was negative for ischemia. No significant disease in the remainder of the left  circumflex and its branches. 4. Widely patent right coronary artery. 5. Normal left ventricular systolic function.  LVEDP 12 mmHg.  Ejection fraction 50%. 6.   Significant tortuosity in the right subclavian/innominate area. Would not attempt radial cath from the right radial in the future. If radial approach is necessary, would try left radial.  RECOMMENDATION:  Continue medical therapy. He needs aggressive diabetes control. It appears that his chest discomfort is noncardiac. If his symptoms persist, will try to increase medical therapy. He will resume his Xarelto in 2 days.  .  EKG:  EKG is ordered today.  The ekg ordered today demonstrates NSR 62 bpm, no acute ischemic changes  Recent Labs: 05/18/2016: ALT 22 05/19/2016: Magnesium 1.9 11/12/2016: BUN 19; Creatinine, Ser 1.41; Hemoglobin 12.9; Platelets 225; Potassium 4.0; Sodium 136   Recent Lipid Panel    Component Value Date/Time   CHOL 144 03/21/2013 0400   TRIG 122 03/21/2013 0400   HDL 57 03/21/2013 0400   CHOLHDL 2.5 03/21/2013 0400   VLDL 24 03/21/2013 0400   LDLCALC 63 03/21/2013 0400    Physical Exam:    VS:  BP 106/64   Pulse 64   Ht '5\' 5"'$  (1.651 m)   Wt 186 lb (84.4 kg)   SpO2 95%  BMI 30.95 kg/m     Wt Readings from Last 3 Encounters:  05/17/17 186 lb (84.4 kg)  03/20/17 194 lb 12.8 oz (88.4 kg)  11/15/16 186 lb (84.4 kg)     Physical Exam  Constitutional: He is oriented to person, place, and time. He appears well-developed and well-nourished. No distress.  HENT:  Head: Normocephalic and atraumatic.  Neck: Normal range of motion. Neck supple. No JVD present.  Cardiovascular: Normal rate, regular rhythm, normal heart sounds and intact distal pulses. Exam reveals no gallop and no friction rub.  No murmur heard. Pulmonary/Chest: Effort normal and breath sounds normal. No respiratory distress. He has no wheezes. He has no rales.  Abdominal: Soft. Bowel sounds are normal. He exhibits no distension.    Musculoskeletal: Normal range of motion. He exhibits no edema or deformity.  Neurological: He is alert and oriented to person, place, and time.  Skin: Skin is warm and dry.  Psychiatric: He has a normal mood and affect. His behavior is normal. Thought content normal.  Vitals reviewed.   ASSESSMENT:    1. CAD in native artery   2. Angina pectoris ([place])   3. Essential hypertension   4. Hyperlipidemia, unspecified hyperlipidemia type   5. OSA on CPAP   6. Type 2 diabetes mellitus with complication, with long-term current use of insulin ([place])   7. CKD (chronic kidney disease), stage II   8. History of pulmonary embolism: June 2014,  Takes Xarelto    PLAN:    In order of problems listed above:  Stable Angina: 2 week hx of very mild chest tightness radiating to left arm with mild shortness of breath occurring with first rising from seated and with walking, lasting 3-5 minutes, relieved with rest. Also increased fatigue and activity intolerance. Previous hx of CAD with stent to LAD and in 2015 moderate non-occlusive disease in L Circ.  No ischemic changes on EKG. Case discussed with Dr. Marlou Porch, DOD. Plan for cardiac cath with [name] next week to evaluate for further progression of ASCVD. Discussed with the patient and he is in agreement. The patient understands that risks included but are not limited to stroke (1 in 1000), death (1 in 5), kidney failure [usually temporary] (1 in 500), bleeding (1 in 200), allergic reaction [possibly serious] (1 in 200).   -See prior cath notes regarding: Significant tortuosity in the right subclavian/innominate area. Would not attempt radial cath from the right radial in the future. If radial approach is necessary, would try left radial.  -Will obtain BMet, CBC. Evaluate renal function.   -Pt instructed to hold Xarelto for 2 days prior to cath.   -He has current sublingual NTG. Instructed that if his discomfort worsens and is not relieved with rest  or NTG he should go to the hospital.   CAD: stent to the LAD in 2010, 2012 cath showed patent stent and no significant CAD, cardiac cath in 2015 showed moderate non-obstructive disease in the left circ. Medical therapy includes statin, ACE inhibitor. Not on aspirin due to need for anticoagulation. Not on beta blocker presumably due to heart rate around 60.  Hypertension: Was taken off ramipril due to lip swelling. Now on valsartan 40 mg bid. BP is soft. He has been having dizziness, visual changes and being off balance. Orthostatics negative. WIll stop valsartan to evaluate for possible cause of his dizziness.   Hyperlipidemia: rosuvastatin 10 mg. No recent lipid panel in Epic, LDL was 63 in 9/18. At goal of <70.  Followed by PCP.  OSA on CPAP: hasn't been using it consistently, since his prostate cancer radiation as he is up to void often during the night.   Diabetes type 2 on insulin:  Not well controlled. Last A1c was 9.2 in 04/2016. Needs better control to reduce CV risk.   CKD stage II: Baseline creatinine ~1.3. Was 2.03 in 05/2016. Last level was 1.41 in 11/2016, eGFR 55.  Will check BMet today.   History of bilateral PE: On Xarelto for anticoagulation, indefinitely. Will hold for cath and resume after.    Medication Adjustments/Labs and Tests Ordered: Current medicines are reviewed at length with the patient today.  Concerns regarding medicines are outlined above. Labs and tests ordered and medication changes are outlined in the patient instructions below:  Patient Instructions  Medication Instructions: Your physician has recommended you make the following change in your medication:  STOP: Valsartan   Labwork: TODAY: BMET & CBC  Procedures/Testing:   [facility] OFF[facility] [address], [place] [address] Dept: [phone] Loc: [place]  05/17/2017  You are  scheduled for a Cardiac Catheterization on Friday, May 17 with Dr. Larae Grooms.  1. Please arrive at the [facility] (Main Entrance A) at [facility]: [place], [place] [address] at 10:00 AM (two hours before your procedure to ensure your preparation). Free valet parking service is available.   Special note: Every effort is made to have your procedure done on time. Please understand that emergencies sometimes delay scheduled procedures.  2. Diet: Do not eat or drink anything after midnight prior to your procedure except sips of water to take medications.  3. Labs: You will need to have blood drawn on Thursday, May 9 at [facility]. [address]  Open: 7:30am - 5pm    Phone: [phone]. You do not need to be fasting.  4. Medication instructions in preparation for your procedure:  Stop taking Xarelto (Rivaroxaban) on Wednesday, May 15.  DO NOT TAKE METFORMIN THE DAY OF YOUR PROCEDURE AND FOR 2 DAYS AFTER  DO NOT TAKE YOUR HUMALOG THE DAY OF YOUR PROCEDURE    DO NOT TAKE LASIX THE DAY OF YOUR PROCEDURE   DO NOT TAKE PRANDIN THE DAY OF YOUR PROCEDURE   On the morning of your procedure, take your Aspirin and any morning medicines NOT listed above.  You may use sips of water.  5. Plan for one night stay--bring personal belongings. 6. Bring a current list of your medications and current insurance cards. 7. You MUST have a responsible person to drive you home. 8. Someone MUST be with you the first 24 hours after you arrive home or your discharge will be delayed. 9. Please wear clothes that are easy to get on and off and wear slip-on shoes.  Thank you for allowing [place] to care for you!   -- [facility]   Follow-Up: We will contact you with a follow up date     Any Additional Special Instructions Will Be Listed Below (If Applicable).     If you need a refill on your cardiac  medications before your next appointment, please call your pharmacy.       Signed, Daune Perch, NP  05/17/2017 12:54 PM    [facility]

## 2017-05-18 LAB — CBC
Hematocrit: 38.6 % (ref 37.5–51.0)
Hemoglobin: 12.5 g/dL — ABNORMAL LOW (ref 13.0–17.7)
MCH: 30.9 pg (ref 26.6–33.0)
MCHC: 32.4 g/dL (ref 31.5–35.7)
MCV: 96 fL (ref 79–97)
Platelets: 212 10*3/uL (ref 150–379)
RBC: 4.04 x10E6/uL — ABNORMAL LOW (ref 4.14–5.80)
RDW: 14.1 % (ref 12.3–15.4)
WBC: 4 10*3/uL (ref 3.4–10.8)

## 2017-05-18 LAB — BASIC METABOLIC PANEL
BUN/Creatinine Ratio: 22 (ref 10–24)
BUN: 30 mg/dL — ABNORMAL HIGH (ref 8–27)
CO2: 23 mmol/L (ref 20–29)
Calcium: 9.1 mg/dL (ref 8.6–10.2)
Chloride: 103 mmol/L (ref 96–106)
Creatinine, Ser: 1.36 mg/dL — ABNORMAL HIGH (ref 0.76–1.27)
GFR calc Af Amer: 58 mL/min/{1.73_m2} — ABNORMAL LOW (ref >59)
GFR calc non Af Amer: 51 mL/min/{1.73_m2} — ABNORMAL LOW (ref >59)
Glucose: 104 mg/dL — ABNORMAL HIGH (ref 65–99)
Potassium: 5.1 mmol/L (ref 3.5–5.2)
Sodium: 142 mmol/L (ref 134–144)

## 2017-05-21 DIAGNOSIS — E1165 Type 2 diabetes mellitus with hyperglycemia: Secondary | ICD-10-CM | POA: Diagnosis not present

## 2017-05-21 DIAGNOSIS — E7800 Pure hypercholesterolemia, unspecified: Secondary | ICD-10-CM | POA: Diagnosis not present

## 2017-05-21 DIAGNOSIS — G609 Hereditary and idiopathic neuropathy, unspecified: Secondary | ICD-10-CM | POA: Diagnosis not present

## 2017-05-21 DIAGNOSIS — I10 Essential (primary) hypertension: Secondary | ICD-10-CM | POA: Diagnosis not present

## 2017-05-24 ENCOUNTER — Telehealth: Payer: Self-pay | Admitting: *Deleted

## 2017-05-24 NOTE — Telephone Encounter (Addendum)
Pt contacted pre-catheterization scheduled at [facility] for: Friday May 25, 2017 12 Noon Verified arrival time and place: [place] Entrance A at: 10 AM  No solid food after midnight prior to cath, clear liquids until 5 AM day of procedure.   Hold: Metformin 05/25/17 and 48 hours post procedure Insulin AM of procedure, adjust PM Insulin. Furosemide 05/24/17 and 05/25/17. Xarelto -last dose AM 05/23/17 until post procedure.  AM meds can be  taken pre-cath with sip of water including: ASA 81 mg   Pt is aware to drink water for pre-procedure hydration.   Confirmed patient has responsible person to drive home post procedure and observe patient for 24 hours: yes

## 2017-05-25 ENCOUNTER — Ambulatory Visit (HOSPITAL_COMMUNITY)
Admission: RE | Admit: 2017-05-25 | Discharge: 2017-05-25 | Disposition: A | Discharge status: Home / Self Care | Payer: Medicare Other | Source: Ambulatory Visit | Attending: Interventional Cardiology | Admitting: Interventional Cardiology

## 2017-05-25 ENCOUNTER — Ambulatory Visit (HOSPITAL_COMMUNITY)
Admission: RE | Disposition: A | Discharge status: Home / Self Care | Payer: Self-pay | Source: Ambulatory Visit | Attending: Interventional Cardiology

## 2017-05-25 DIAGNOSIS — E1122 Type 2 diabetes mellitus with diabetic chronic kidney disease: Secondary | ICD-10-CM | POA: Insufficient documentation

## 2017-05-25 DIAGNOSIS — Z9049 Acquired absence of other specified parts of digestive tract: Secondary | ICD-10-CM | POA: Insufficient documentation

## 2017-05-25 DIAGNOSIS — Z86711 Personal history of pulmonary embolism: Secondary | ICD-10-CM | POA: Diagnosis not present

## 2017-05-25 DIAGNOSIS — N400 Enlarged prostate without lower urinary tract symptoms: Secondary | ICD-10-CM | POA: Insufficient documentation

## 2017-05-25 DIAGNOSIS — Z8546 Personal history of malignant neoplasm of prostate: Secondary | ICD-10-CM | POA: Insufficient documentation

## 2017-05-25 DIAGNOSIS — Z7951 Long term (current) use of inhaled steroids: Secondary | ICD-10-CM | POA: Diagnosis not present

## 2017-05-25 DIAGNOSIS — Z7901 Long term (current) use of anticoagulants: Secondary | ICD-10-CM | POA: Diagnosis not present

## 2017-05-25 DIAGNOSIS — G4733 Obstructive sleep apnea (adult) (pediatric): Secondary | ICD-10-CM | POA: Diagnosis not present

## 2017-05-25 DIAGNOSIS — E1140 Type 2 diabetes mellitus with diabetic neuropathy, unspecified: Secondary | ICD-10-CM | POA: Diagnosis not present

## 2017-05-25 DIAGNOSIS — M5136 Other intervertebral disc degeneration, lumbar region: Secondary | ICD-10-CM | POA: Insufficient documentation

## 2017-05-25 DIAGNOSIS — I129 Hypertensive chronic kidney disease with stage 1 through stage 4 chronic kidney disease, or unspecified chronic kidney disease: Secondary | ICD-10-CM | POA: Insufficient documentation

## 2017-05-25 DIAGNOSIS — Z9889 Other specified postprocedural states: Secondary | ICD-10-CM | POA: Insufficient documentation

## 2017-05-25 DIAGNOSIS — Z8673 Personal history of transient ischemic attack (TIA), and cerebral infarction without residual deficits: Secondary | ICD-10-CM | POA: Insufficient documentation

## 2017-05-25 DIAGNOSIS — Z8249 Family history of ischemic heart disease and other diseases of the circulatory system: Secondary | ICD-10-CM | POA: Insufficient documentation

## 2017-05-25 DIAGNOSIS — Z888 Allergy status to other drugs, medicaments and biological substances status: Secondary | ICD-10-CM | POA: Insufficient documentation

## 2017-05-25 DIAGNOSIS — Z955 Presence of coronary angioplasty implant and graft: Secondary | ICD-10-CM | POA: Insufficient documentation

## 2017-05-25 DIAGNOSIS — Z9842 Cataract extraction status, left eye: Secondary | ICD-10-CM | POA: Insufficient documentation

## 2017-05-25 DIAGNOSIS — Z9841 Cataract extraction status, right eye: Secondary | ICD-10-CM | POA: Diagnosis not present

## 2017-05-25 DIAGNOSIS — Z79899 Other long term (current) drug therapy: Secondary | ICD-10-CM | POA: Insufficient documentation

## 2017-05-25 DIAGNOSIS — N182 Chronic kidney disease, stage 2 (mild): Secondary | ICD-10-CM | POA: Diagnosis not present

## 2017-05-25 DIAGNOSIS — E860 Dehydration: Secondary | ICD-10-CM | POA: Diagnosis not present

## 2017-05-25 DIAGNOSIS — E785 Hyperlipidemia, unspecified: Secondary | ICD-10-CM | POA: Insufficient documentation

## 2017-05-25 DIAGNOSIS — Z981 Arthrodesis status: Secondary | ICD-10-CM | POA: Diagnosis not present

## 2017-05-25 DIAGNOSIS — K219 Gastro-esophageal reflux disease without esophagitis: Secondary | ICD-10-CM | POA: Insufficient documentation

## 2017-05-25 DIAGNOSIS — Z794 Long term (current) use of insulin: Secondary | ICD-10-CM | POA: Diagnosis not present

## 2017-05-25 DIAGNOSIS — I25118 Atherosclerotic heart disease of native coronary artery with other forms of angina pectoris: Secondary | ICD-10-CM | POA: Insufficient documentation

## 2017-05-25 HISTORY — PX: LEFT HEART CATH AND CORONARY ANGIOGRAPHY: CATH118249

## 2017-05-25 LAB — GLUCOSE, CAPILLARY
Glucose-Capillary: 209 mg/dL — ABNORMAL HIGH (ref 65–99)
Glucose-Capillary: 169 mg/dL — ABNORMAL HIGH (ref 65–99)
Glucose-Capillary: 265 mg/dL — ABNORMAL HIGH (ref 65–99)

## 2017-05-25 SURGERY — LEFT HEART CATH AND CORONARY ANGIOGRAPHY
Anesthesia: LOCAL

## 2017-05-25 MED ORDER — SODIUM CHLORIDE 0.9 % WEIGHT BASED INFUSION
3.0000 mL/kg/h | INTRAVENOUS | Status: DC
  Administered 2017-05-25: 3 mL/kg/h via INTRAVENOUS

## 2017-05-25 MED ORDER — HEPARIN (PORCINE) IN NACL 1000-0.9 UT/500ML-% IV SOLN
INTRAVENOUS | Status: AC
  Filled 2017-05-25: qty 1000

## 2017-05-25 MED ORDER — LIDOCAINE HCL (PF) 1 % IJ SOLN
INTRAMUSCULAR | Status: DC | PRN
  Administered 2017-05-25: 15 mL via SUBCUTANEOUS

## 2017-05-25 MED ORDER — SODIUM CHLORIDE 0.9 % WEIGHT BASED INFUSION: 1.0000 mL/kg/h | INTRAVENOUS | Status: DC

## 2017-05-25 MED ORDER — LIDOCAINE HCL (PF) 1 % IJ SOLN
INTRAMUSCULAR | Status: AC
  Filled 2017-05-25: qty 30

## 2017-05-25 MED ORDER — SODIUM CHLORIDE 0.9% FLUSH: 3.0000 mL | INTRAVENOUS | Status: DC | PRN

## 2017-05-25 MED ORDER — ONDANSETRON HCL 4 MG/2ML IJ SOLN
4.0000 mg | Freq: Four times a day (QID) | INTRAMUSCULAR | Status: DC | PRN
Start: 2017-05-25 — End: 2017-05-25

## 2017-05-25 MED ORDER — METFORMIN HCL ER 500 MG PO TB24: 500.0000 mg | ORAL_TABLET | Freq: Every evening | ORAL | Status: DC

## 2017-05-25 MED ORDER — SODIUM CHLORIDE 0.9% FLUSH: 3.0000 mL | Freq: Two times a day (BID) | INTRAVENOUS | Status: DC

## 2017-05-25 MED ORDER — FENTANYL CITRATE (PF) 100 MCG/2ML IJ SOLN
INTRAMUSCULAR | Status: AC
  Filled 2017-05-25: qty 2

## 2017-05-25 MED ORDER — MIDAZOLAM HCL 2 MG/2ML IJ SOLN
INTRAMUSCULAR | Status: DC | PRN
  Administered 2017-05-25: 2 mg via INTRAVENOUS
  Administered 2017-05-25: 1 mg via INTRAVENOUS

## 2017-05-25 MED ORDER — SODIUM CHLORIDE 0.9 % IV SOLN: 250.0000 mL | INTRAVENOUS | Status: DC | PRN

## 2017-05-25 MED ORDER — MIDAZOLAM HCL 2 MG/2ML IJ SOLN
INTRAMUSCULAR | Status: AC
  Filled 2017-05-25: qty 2

## 2017-05-25 MED ORDER — RIVAROXABAN 20 MG PO TABS: 20.0000 mg | ORAL_TABLET | Freq: Every day | ORAL | Status: DC

## 2017-05-25 MED ORDER — IOHEXOL 350 MG/ML SOLN
INTRAVENOUS | Status: DC | PRN
  Administered 2017-05-25: 55 mL via INTRA_ARTERIAL

## 2017-05-25 MED ORDER — ACETAMINOPHEN 325 MG PO TABS: 650.0000 mg | ORAL_TABLET | ORAL | Status: DC | PRN

## 2017-05-25 MED ORDER — HEPARIN (PORCINE) IN NACL 2-0.9 UNITS/ML
INTRAMUSCULAR | Status: AC | PRN
  Administered 2017-05-25 (×2): 500 mL

## 2017-05-25 MED ORDER — FENTANYL CITRATE (PF) 100 MCG/2ML IJ SOLN
INTRAMUSCULAR | Status: DC | PRN
  Administered 2017-05-25: 25 ug via INTRAVENOUS
  Administered 2017-05-25: 50 ug via INTRAVENOUS

## 2017-05-25 MED ORDER — SODIUM CHLORIDE 0.9 % IV SOLN: INTRAVENOUS | Status: AC

## 2017-05-25 MED ORDER — ASPIRIN 81 MG PO CHEW: 81.0000 mg | CHEWABLE_TABLET | ORAL | Status: DC

## 2017-05-25 SURGICAL SUPPLY — 8 items
CATH INFINITI 5FR MULTPACK ANG (CATHETERS) ×1 IMPLANT
COVER PRB 48X5XTLSCP FOLD TPE (BAG) IMPLANT
COVER PROBE 5X48 (BAG) ×2
KIT HEART LEFT (KITS) ×2 IMPLANT
PACK CARDIAC CATHETERIZATION (CUSTOM PROCEDURE TRAY) ×2 IMPLANT
SHEATH PINNACLE 5F 10CM (SHEATH) ×1 IMPLANT
TRANSDUCER W/STOPCOCK (MISCELLANEOUS) ×2 IMPLANT
WIRE EMERALD 3MM-J .035X150CM (WIRE) ×1 IMPLANT

## 2017-05-25 NOTE — Interval H&P Note (Signed)
Cath Lab Visit (complete for each Cath Lab visit)  Clinical Evaluation Leading to the Procedure:   ACS: No.  Non-ACS:    Anginal Classification: CCS III  Anti-ischemic medical therapy: Minimal Therapy (1 class of medications)  Non-Invasive Test Results: No non-invasive testing performed  Prior CABG: No previous CABG      History and Physical Interval Note:  05/25/2017 11:32 AM  Steve Brown  has presented today for surgery, with the diagnosis of angina  The various methods of treatment have been discussed with the patient and family. After consideration of risks, benefits and other options for treatment, the patient has consented to  Procedure(s): LEFT HEART CATH AND CORONARY ANGIOGRAPHY (N/A) as a surgical intervention .  The patient's history has been reviewed, patient examined, no change in status, stable for surgery.  I have reviewed the patient's chart and labs.  Questions were answered to the patient's satisfaction.     Larae Grooms

## 2017-05-25 NOTE — Discharge Instructions (Signed)

## 2017-05-25 NOTE — Progress Notes (Signed)
Site area: Right groin a 5 french arterial sheath was removed  Site Prior to Removal:  Level 0  Pressure Applied For s0 MINUTES    Bedrest Beginning at 1300p  Manual:   Yes.    Patient Status During Pull:  stable  Post Pull Groin Site:  Level 0  Post Pull Instructions Given:  Yes.    Post Pull Pulses Present:  Yes.    Dressing Applied:  Yes.    Comments:  VS remain stable

## 2017-05-25 NOTE — Research (Signed)
CADFEM Informed Consent   Subject Name: Steve Brown  Subject met inclusion and exclusion criteria.  The informed consent form, study requirements and expectations were reviewed with the subject and questions and concerns were addressed prior to the signing of the consent form.  The subject verbalized understanding of the trail requirements.  The subject agreed to participate in the CADFEM trial and signed the informed consent.  The informed consent was obtained prior to performance of any protocol-specific procedures for the subject.  A copy of the signed informed consent was given to the subject and a copy was placed in the subject's medical record.  Christena Flake 05/25/2017, 10:15 AM

## 2017-05-28 ENCOUNTER — Encounter (HOSPITAL_COMMUNITY): Payer: Self-pay | Admitting: Interventional Cardiology

## 2017-05-28 DIAGNOSIS — E1165 Type 2 diabetes mellitus with hyperglycemia: Secondary | ICD-10-CM | POA: Diagnosis not present

## 2017-05-28 DIAGNOSIS — E7800 Pure hypercholesterolemia, unspecified: Secondary | ICD-10-CM | POA: Diagnosis not present

## 2017-05-28 DIAGNOSIS — I10 Essential (primary) hypertension: Secondary | ICD-10-CM | POA: Diagnosis not present

## 2017-05-28 DIAGNOSIS — G609 Hereditary and idiopathic neuropathy, unspecified: Secondary | ICD-10-CM | POA: Diagnosis not present

## 2017-05-28 MED FILL — Heparin Sod (Porcine)-NaCl IV Soln 1000 Unit/500ML-0.9%: INTRAVENOUS | Qty: 1000 | Status: AC

## 2017-05-29 DIAGNOSIS — R42 Dizziness and giddiness: Secondary | ICD-10-CM | POA: Diagnosis not present

## 2017-05-29 DIAGNOSIS — R5383 Other fatigue: Secondary | ICD-10-CM | POA: Diagnosis not present

## 2017-05-29 DIAGNOSIS — R531 Weakness: Secondary | ICD-10-CM | POA: Diagnosis not present

## 2017-05-29 DIAGNOSIS — R300 Dysuria: Secondary | ICD-10-CM | POA: Diagnosis not present

## 2017-05-29 DIAGNOSIS — I209 Angina pectoris, unspecified: Secondary | ICD-10-CM | POA: Diagnosis not present

## 2017-06-07 DIAGNOSIS — I69354 Hemiplegia and hemiparesis following cerebral infarction affecting left non-dominant side: Secondary | ICD-10-CM | POA: Diagnosis not present

## 2017-06-07 DIAGNOSIS — E559 Vitamin D deficiency, unspecified: Secondary | ICD-10-CM | POA: Diagnosis not present

## 2017-06-07 DIAGNOSIS — Z1389 Encounter for screening for other disorder: Secondary | ICD-10-CM | POA: Diagnosis not present

## 2017-06-07 DIAGNOSIS — Z0000 Encounter for general adult medical examination without abnormal findings: Secondary | ICD-10-CM | POA: Diagnosis not present

## 2017-06-07 DIAGNOSIS — E782 Mixed hyperlipidemia: Secondary | ICD-10-CM | POA: Diagnosis not present

## 2017-06-07 DIAGNOSIS — I10 Essential (primary) hypertension: Secondary | ICD-10-CM | POA: Diagnosis not present

## 2017-06-07 DIAGNOSIS — I679 Cerebrovascular disease, unspecified: Secondary | ICD-10-CM | POA: Diagnosis not present

## 2017-06-07 DIAGNOSIS — A6000 Herpesviral infection of urogenital system, unspecified: Secondary | ICD-10-CM | POA: Diagnosis not present

## 2017-06-12 NOTE — Progress Notes (Signed)
Cardiology Office Note   Date:  06/13/2017   ID:  Jaran Sainz, DOB [birth year], MRN [id number]  PCP:  Lawerance Cruel, MD    No chief complaint on file.  CAD  Wt Readings from Last 3 Encounters:  06/13/17 187 lb (84.8 kg)  05/25/17 186 lb (84.4 kg)  05/17/17 186 lb (84.4 kg)       History of Present Illness: Steve Brown is a 77 y.o. male  With a h/o CAD. He has an LAd DES and moderate circumflex disease.  Per the record: He has a "past medical history significant for CAD s/p LAD stent 2010, bilateral PE 07/2012 on Xarelto, diabetes type 2, prostate cancer diagnosed in 2018, OSA on CPAP, hypertension, hyperlipidemia, history of TIAs and stroke, CKD stage II.  Mr. Barrack had a stent to the LAD in 2010, 2012 cath showed patent stent and no significant CAD, cardiac cath in 2015 showed moderate disease which was negative by FFR. His most recent echocardiogram in 04/2016 showed normal LV systolic function with EF 60-65 percent, normal wall motion, grade 2 diastolic dysfunction, mild AR, mildly dilated aortic root 39 mm, mild MR and increased thickness of the septum consistent with lipomatous hypertrophy."    Last cath in 2019 showed:  "Acute Mrg lesion is 25% stenosed.  Mid RCA lesion is 25% stenosed.  2nd Mrg lesion is 40% stenosed.  Mid Cx lesion is 40% stenosed.  Previously placed Mid LAD stent (unknown type) is widely patent.  Prox LAD-1 lesion is 25% stenosed.  Prox LAD-2 lesion is 25% stenosed.  The left ventricular systolic function is normal.  LV end diastolic pressure is normal.  The left ventricular ejection fraction is 55-65% by visual estimate.  There is no aortic valve stenosis.   Patent stent.  Nonobstructive disease.  Would investigate noncardiac causes of fatigue."    No groin trouble pst cath.   He continues to have atypical pain in his chest.  No relation to exertion.  It typically happens when eating that he feels a brief pain going from the  right side of his chest to the left.  It resolves on its own.  It does not limit his activity.  [facility] is [facility] the [facility] without any problems.  Feels fatigue is a little better.    No problems on Xarelto.      Past Medical History:  Diagnosis Date  . Abnormal prostate biopsy   . Anticoagulant long-term use    currently xarelto  . BPH with elevated PSA   . CKD (chronic kidney disease), stage II   . Complication of anesthesia    limted neck rom limited use of left arm due to cva  . Coronary artery disease    CARDIOLOGIST-  DR Irish Lack--  2010-- PCI w/ stenting midLAD  . DDD (degenerative disc disease), lumbar   . Degeneration of cervical intervertebral disc   . Depression   . Dyspnea on exertion   . GERD (gastroesophageal reflux disease)   . Hemiparesis due to cerebral infarction   . History of cerebrovascular accident (CVA) with residual deficit 2002 and 2003--  hemiparisis both sides   per MRI  anterior left frontal lobe, left para midline pons, and inferior cerebullam bilaterally infarcts  . History of pulmonary embolus (PE)    06-30-2012  extensive bilaterally  . History of recurrent TIAs   . History of syncope    hx multiple pre-syncope and syncopal episodes due to vasovagal, orthostatic hypotension, dehydration  .  History of TIAs    several since 2002  . Hyperlipidemia   . Hypertension   . Mild atherosclerosis of carotid artery, bilateral    per last duplex 11-04-2014  bilateral ICA 1--39%  . Neuropathy    fingers  . OSA on CPAP    followed by dr dohmeier--  sev. osa w/ AHI 65.9  . Prostate cancer ([place]) dx 2018  . S/P coronary artery stent placement 2010   stenting to mid LAD  . Simple renal cyst    bilaterally  . Trigger finger of both hands 11-17-13  . Type 2 diabetes mellitus ([place]) dx 1986   last one A1c 9.2 on 04-26-2016  . Unsteady gait    . Hx prior CVA/TIAs;  . Vertebral artery occlusion, left    chronic    Past Surgical History:  Procedure Laterality  Date  . ANTERIOR CERVICAL DECOMP/DISCECTOMY FUSION  2004   C3 -- C6 limited rom  . CARDIAC CATHETERIZATION  06-10-2010   dr Irish Lack   wide patent LAD stent, mid lesion at the origin of the septal prior to the previous stent 40-50%/  normal LVF, ef 55%  . CARDIOVASCULAR STRESS TEST  10-23-2012  dr Irish Lack   normal nuclear perfusion study w/ no ischemia/  normal LV function and wall motion , ef 65%  . CARPAL TUNNEL RELEASE Bilateral   . CATARACT EXTRACTION W/ INTRAOCULAR LENS  IMPLANT, BILATERAL    . CHOLECYSTECTOMY N/A 11/02/2015   Procedure: LAPAROSCOPIC CHOLECYSTECTOMY WITH INTRAOPERATIVE CHOLANGIOGRAM;  Surgeon: Donnie Mesa, MD;  Location: [facility];  Service: General;  Laterality: N/A;  . COLONOSCOPY    . CORONARY ANGIOPLASTY WITH STENT PLACEMENT  02/2008   stenting to mid LAD  . GOLD SEED IMPLANT N/A 11/15/2016   Procedure: GOLD SEED IMPLANT TIMES THREE;  Surgeon: Ardis Hughs, MD;  Location: [facility];  Service: Urology;  Laterality: N/A;  . LEFT HEART CATH AND CORONARY ANGIOGRAPHY N/A 05/25/2017   Procedure: LEFT HEART CATH AND CORONARY ANGIOGRAPHY;  Surgeon: Jettie Booze, MD;  Location: [facility];  Service: Cardiovascular;  Laterality: N/A;  . LEFT HEART CATHETERIZATION WITH CORONARY ANGIOGRAM N/A 04/03/2013   Procedure: LEFT HEART CATHETERIZATION WITH CORONARY ANGIOGRAM;  Surgeon: Jettie Booze, MD;  Location: [facility] CATH LAB;  Service: Cardiovascular;  Laterality: N/A;  patent mLAD stent  w/ mild disease in remainder LAD and its branches;  mod. focal lesion midLCFx- FFR of lesion was negative for ischemia/  normal LVSF, ef 50%  . lungs  2005   "fluid pumped off lungs"  . NEUROPLASTY / TRANSPOSITION ULNAR NERVE AT ELBOW Right 2004  . PROSTATE BIOPSY N/A 08/31/2016   Procedure: PROSTATE  BIOPSY TRANSRECTAL ULTRASONIC PROSTATE (TUBP);  Surgeon: Ardis Hughs, MD;  Location: [facility];  Service: Urology;  Laterality: N/A;  .  SPACE OAR INSTILLATION N/A 11/15/2016   Procedure: SPACE OAR INSTILLATION;  Surgeon: Ardis Hughs, MD;  Location: [facility];  Service: Urology;  Laterality: N/A;  . TRANSTHORACIC ECHOCARDIOGRAM  04/27/2016   severe focal basal LVH, ef 60-65%,  grade 2 diastoilc dysfunction/  mild AR, MR, and TR/  atrial septum lipomatous hypertrophy/  PASP 24mmHg  . UMBILICAL HERNIA REPAIR       Current Outpatient Medications  Medication Sig Dispense Refill  . acetaminophen (TYLENOL) 500 MG tablet Take 1,000 mg by mouth every 6 (six) hours as needed.     . Ascorbic Acid (VITAMIN C PO) Take  1 tablet by mouth daily.    . BD PEN NEEDLE NANO U/F 32G X 4 MM MISC by Does not apply route.     . bisacodyl (DULCOLAX) 5 MG EC tablet Take 5 mg by mouth daily as needed for moderate constipation.    . cholecalciferol (VITAMIN D) 1000 UNITS tablet Take 1,000 Units by mouth daily.    . diclofenac sodium (VOLTAREN) 1 % GEL Apply 1 application topically daily as needed (pain).   1  . Echinacea 450 MG CAPS Take 1 capsule by mouth daily.    . Ferrous Gluconate-C-Folic Acid (IRON-C PO) Take 1 tablet by mouth daily.    . fluticasone (FLONASE) 50 MCG/ACT nasal spray Place 1 spray into both nostrils 2 (two) times daily.   0  . furosemide (LASIX) 20 MG tablet Take 20 mg by mouth daily as needed (swelling).     Marland Kitchen HUMALOG MIX 75/25 KWIKPEN (75-25) 100 UNIT/ML Kwikpen Inject 6-10 Units into the skin 3 (three) times daily with meals.   1  . Insulin Glargine (TOUJEO SOLOSTAR) 300 UNIT/ML SOPN Inject 25 Units into the skin every morning.    . MECLIZINE HCL PO Take 2 capsules by mouth daily.    . metFORMIN (GLUCOPHAGE) 500 MG tablet Take 500 mg by mouth as needed (Diabetes).    . Multiple Vitamin (MULTI-DAY PO) Take 1 tablet by mouth daily.    . nitroGLYCERIN (NITROSTAT) 0.4 MG SL tablet Place 1 tablet (0.4 mg total) under the tongue every 5 (five) minutes as needed for chest pain. 25 tablet 12  . pantoprazole  (PROTONIX) 40 MG tablet Take 40 mg by mouth daily.     . pregabalin (LYRICA) 300 MG capsule Take 1 capsule (300 mg total) by mouth 2 (two) times daily. 180 capsule 3  . rivaroxaban (XARELTO) 20 MG TABS tablet Take 1 tablet (20 mg total) by mouth daily. Resume in 72 hours after procedure. 30 tablet   . rosuvastatin (CRESTOR) 10 MG tablet Take 10 mg by mouth every evening.     . tamsulosin (FLOMAX) 0.4 MG CAPS capsule Take 0.4 mg by mouth daily.    . valACYclovir (VALTREX) 500 MG tablet Take 500 mg by mouth daily.     No current facility-administered medications for this visit.     Allergies:   Other and Phenergan [promethazine]    Social History:  The patient  reports that he has never smoked. He has never used smokeless tobacco. He reports that he does not drink alcohol or use drugs.   Family History:  The patient's family history includes Aneurysm in his mother; Cancer in his father.    ROS:  Please see the history of present illness.   Otherwise, review of systems are positive for mild fatigue.   All other systems are reviewed and negative.    PHYSICAL EXAM: VS:  BP 110/68   Pulse (!) 55   Ht 5\' 5"  (1.651 m)   Wt 187 lb (84.8 kg)   SpO2 99%   BMI 31.12 kg/m  , BMI Body mass index is 31.12 kg/m. GEN: Well nourished, well developed, in no acute distress  HEENT: normal  Neck: no JVD, carotid bruits, or masses Cardiac: RRR; no murmurs, rubs, or gallops,no edema  Respiratory:  clear to auscultation bilaterally, normal work of breathing GI: soft, nontender, nondistended, + BS MS: no deformity or atrophy  Skin: warm and dry, no rash Neuro:  Strength and sensation are intact Psych: euthymic mood, full affect  Recent Labs: 05/17/2017: BUN 30; Creatinine, Ser 1.36; Hemoglobin 12.5; Platelets 212; Potassium 5.1; Sodium 142   Lipid Panel    Component Value Date/Time   CHOL 144 03/21/2013 0400   TRIG 122 03/21/2013 0400   HDL 57 03/21/2013 0400   CHOLHDL 2.5 03/21/2013 0400     VLDL 24 03/21/2013 0400   LDLCALC 63 03/21/2013 0400     Other studies Reviewed: Additional studies/ records that were reviewed today with results demonstrating: cath report reviewed.   ASSESSMENT AND PLAN:  1. CAD: COntinue medical therapy.  Sx controled. 2. DM: Continue healthy diet. 3. HTN: Controlled with lifestyle changes.  No further meds needed at this time.  4. OSA: COntinue CPAP.  5. Anticoagulated: FOr h/o PE. Xarelto. No bleeding issues.    Current medicines are reviewed at length with the patient today.  The patient concerns regarding his medicines were addressed.  The following changes have been made:  No change  Labs/ tests ordered today include:  No orders of the defined types were placed in this encounter.   Recommend 150 minutes/week of aerobic exercise Low fat, low carb, high fiber diet recommended  Disposition:   FU in 6 months   Signed, Larae Grooms, MD  06/13/2017 11:17 AM    [address], [place], [place]  [id number] Phone: [phone]; Fax: [phone]

## 2017-06-13 ENCOUNTER — Ambulatory Visit (INDEPENDENT_AMBULATORY_CARE_PROVIDER_SITE_OTHER): Payer: Medicare Other | Admitting: Interventional Cardiology

## 2017-06-13 ENCOUNTER — Encounter: Payer: Self-pay | Admitting: Interventional Cardiology

## 2017-06-13 VITALS — BP 110/68 | HR 55 | Ht 65.0 in | Wt 187.0 lb

## 2017-06-13 DIAGNOSIS — I2510 Atherosclerotic heart disease of native coronary artery without angina pectoris: Secondary | ICD-10-CM | POA: Diagnosis not present

## 2017-06-13 DIAGNOSIS — I25118 Atherosclerotic heart disease of native coronary artery with other forms of angina pectoris: Secondary | ICD-10-CM | POA: Diagnosis not present

## 2017-06-13 DIAGNOSIS — Z7901 Long term (current) use of anticoagulants: Secondary | ICD-10-CM

## 2017-06-13 DIAGNOSIS — G4733 Obstructive sleep apnea (adult) (pediatric): Secondary | ICD-10-CM | POA: Diagnosis not present

## 2017-06-13 DIAGNOSIS — E1159 Type 2 diabetes mellitus with other circulatory complications: Secondary | ICD-10-CM

## 2017-06-13 DIAGNOSIS — I10 Essential (primary) hypertension: Secondary | ICD-10-CM | POA: Diagnosis not present

## 2017-06-13 NOTE — Patient Instructions (Signed)

## 2017-06-21 DIAGNOSIS — G629 Polyneuropathy, unspecified: Secondary | ICD-10-CM | POA: Diagnosis not present

## 2017-06-21 DIAGNOSIS — M79606 Pain in leg, unspecified: Secondary | ICD-10-CM | POA: Diagnosis not present

## 2017-06-21 DIAGNOSIS — Z79899 Other long term (current) drug therapy: Secondary | ICD-10-CM | POA: Diagnosis not present

## 2017-06-21 DIAGNOSIS — Z79891 Long term (current) use of opiate analgesic: Secondary | ICD-10-CM | POA: Diagnosis not present

## 2017-06-21 DIAGNOSIS — M545 Low back pain: Secondary | ICD-10-CM | POA: Diagnosis not present

## 2017-06-21 DIAGNOSIS — G894 Chronic pain syndrome: Secondary | ICD-10-CM | POA: Diagnosis not present

## 2017-06-26 ENCOUNTER — Telehealth: Payer: Self-pay | Admitting: Interventional Cardiology

## 2017-06-26 NOTE — Telephone Encounter (Signed)
Patient calling and states that earlier today he had pain in his left arm and that it felt heavier. He states that his PCP instructed him to take NTG for this so he took NTG x 1 and felt dizzy afterwards. Patient denies having any chest pain, SOB, n/v, or any other symptoms. Patient states that his left arm heaviness/pain is not associated with activity. Patient's recent cath 05/25/17 showed patent stent, nonobstructive disease. Patient states that he has experienced heaviness in his left arm since his neck surgery in 2004. Patient denies having any symptoms at this time. Made patient aware that NTG is a vasodilator and will drop his BP causing him to feel dizzy and lightheaded when he uses it. Instructed the patient to sit down before taking. Made patient aware that his left arm heaviness sounded more neurological than cardiac. Patient states that he has an appointment with Dr. Brett Fairy in July. Instructed the patient to let us know if his symptoms change or worsen. Patient verbalized understanding and thanked me for the call.

## 2017-06-26 NOTE — Telephone Encounter (Signed)
Pt calling  Pt took Nitro and is dizzy  Pt c/o medication issue:  1. Name of Medication: Nitroglycin  2. How are you currently taking this medication (dosage and times per day)?1 tablety As needed  3. Are you having a reaction (difficulty breathing--STAT)? no  4. What is your medication issue? Pt is dizzy.

## 2017-06-28 ENCOUNTER — Ambulatory Visit: Payer: Medicare Other | Admitting: Cardiology

## 2017-06-29 DIAGNOSIS — I679 Cerebrovascular disease, unspecified: Secondary | ICD-10-CM | POA: Diagnosis not present

## 2017-06-29 DIAGNOSIS — R930 Abnormal findings on diagnostic imaging of skull and head, not elsewhere classified: Secondary | ICD-10-CM | POA: Diagnosis not present

## 2017-06-29 DIAGNOSIS — M5412 Radiculopathy, cervical region: Secondary | ICD-10-CM | POA: Diagnosis not present

## 2017-07-03 ENCOUNTER — Other Ambulatory Visit: Payer: Self-pay | Admitting: Family Medicine

## 2017-07-11 ENCOUNTER — Other Ambulatory Visit: Payer: Self-pay | Admitting: Family Medicine

## 2017-07-11 DIAGNOSIS — I679 Cerebrovascular disease, unspecified: Secondary | ICD-10-CM

## 2017-07-23 ENCOUNTER — Ambulatory Visit
Admission: RE | Admit: 2017-07-23 | Discharge: 2017-07-23 | Disposition: A | Discharge status: Home / Self Care | Payer: Medicare Other | Source: Ambulatory Visit | Attending: Family Medicine | Admitting: Family Medicine

## 2017-07-23 DIAGNOSIS — I639 Cerebral infarction, unspecified: Secondary | ICD-10-CM | POA: Diagnosis not present

## 2017-07-23 DIAGNOSIS — I679 Cerebrovascular disease, unspecified: Secondary | ICD-10-CM

## 2017-07-23 DIAGNOSIS — M4802 Spinal stenosis, cervical region: Secondary | ICD-10-CM | POA: Diagnosis not present

## 2017-07-23 MED ORDER — GADOBENATE DIMEGLUMINE 529 MG/ML IV SOLN
17.0000 mL | Freq: Once | INTRAVENOUS | Status: AC | PRN
  Administered 2017-07-23: 17 mL via INTRAVENOUS

## 2017-07-26 DIAGNOSIS — R109 Unspecified abdominal pain: Secondary | ICD-10-CM | POA: Diagnosis not present

## 2017-07-26 DIAGNOSIS — I679 Cerebrovascular disease, unspecified: Secondary | ICD-10-CM | POA: Diagnosis not present

## 2017-07-27 ENCOUNTER — Other Ambulatory Visit: Payer: Self-pay | Admitting: Family Medicine

## 2017-07-27 DIAGNOSIS — I679 Cerebrovascular disease, unspecified: Secondary | ICD-10-CM

## 2017-07-31 ENCOUNTER — Ambulatory Visit (INDEPENDENT_AMBULATORY_CARE_PROVIDER_SITE_OTHER): Payer: Medicare Other | Admitting: Neurology

## 2017-07-31 VITALS — BP 122/65 | HR 68 | Ht 65.0 in | Wt 187.0 lb

## 2017-07-31 DIAGNOSIS — D6489 Other specified anemias: Secondary | ICD-10-CM

## 2017-07-31 DIAGNOSIS — I679 Cerebrovascular disease, unspecified: Secondary | ICD-10-CM

## 2017-07-31 DIAGNOSIS — C61 Malignant neoplasm of prostate: Secondary | ICD-10-CM

## 2017-07-31 DIAGNOSIS — Z9114 Patient's other noncompliance with medication regimen: Secondary | ICD-10-CM | POA: Diagnosis not present

## 2017-07-31 DIAGNOSIS — I69398 Other sequelae of cerebral infarction: Secondary | ICD-10-CM

## 2017-07-31 DIAGNOSIS — I2510 Atherosclerotic heart disease of native coronary artery without angina pectoris: Secondary | ICD-10-CM

## 2017-07-31 DIAGNOSIS — R42 Dizziness and giddiness: Secondary | ICD-10-CM

## 2017-07-31 DIAGNOSIS — D649 Anemia, unspecified: Secondary | ICD-10-CM | POA: Insufficient documentation

## 2017-07-31 DIAGNOSIS — T66XXXD Radiation sickness, unspecified, subsequent encounter: Secondary | ICD-10-CM

## 2017-07-31 DIAGNOSIS — T66XXXA Radiation sickness, unspecified, initial encounter: Secondary | ICD-10-CM | POA: Insufficient documentation

## 2017-07-31 NOTE — Progress Notes (Signed)
PATIENT: Steve Brown DOB: 03/18/1940  REASON FOR VISIT:  Interval history 07-31-2017,   Interval history from 31 July 2017 for Steve Brown, a 77 year old African-American right-handed gentleman who is presenting with cognitive concerns concerns of vascular origin.  He has been diagnosed with a history of multi-infarct cerebrovascular disease, hypertension, diabetes, has been followed for CPAP compliance, was on chronic anticoagulation but no longer is able to take aspirin. He reports dizziness - lightheaded, not exercise tolerant. Worse when standing up. Antivert was given by PCP.  This has to be seen in relation to changes in RBC and WBC - Dr Waymon Budge follows also his anticoagulants.    With CPAP he had for a while drastically improved his apnea count but he is not using the CPAP on a nightly basis.  He has now again nocturia and sleepiness, higher fatigue- see below.  CPAP Compliance for the last 30 days was only 30% and average of 1 hours 38 minutes, CPAP was set at 13 cm with 3 cm EPR he has a high air leak but the residual apnea count is only 2.2/h.  I am not sure how I can encourage him to use the machine more often.    In light of the his CPAP noncompliance is also the results of recent MRIs of the brain on 23 July 2017.  These were ordered by Dr. Harrington Challenger, his primary care physician.  MRI brain with and without contrast showed a 4 mm right periventricular occipital focus of reduced diffusion likely a new stroke, older strokes affect the right cerebellar region, the left frontal lobe, patchy pontine and supratentorial white matter intensities.  There is also chronically occluded left vertebral artery.  Known is also that the patient has myelomalacia and his cervical spine MRI was done without contrast = It showed between C3 and the 6 cervical vertebrae and anterior cervical fusion with arthrodesis, he does not have canal stenosis but neuroforaminal narrowing on multiple levels.   This may also explain some arm pain or shoulder pain or even weakness.   His Mini-Mental status examination today was 26 out of 30 points, the Epworth sleepiness score 11 points, fatigue severity was high at 47 out of 63 possible points.  The patient also presented with a very low white blood cell count to his primary care physician WBC was only 3.8, RBC 4.0 hemoglobin 12.9 hematocrit 38 MCV 95 which is high, neutrophils 1.6K per microliter which is low.- has a diagnosis of non-diabetic retinopathy was a working diagnosis.  He is followed by Dr. Geroge Baseman, a podiatrist. He is not driving now.   Interval history from 09/28/2016. Steve Brown had recently seen his primary care physician, Dr. Harle Battiest, who ordered an MRI of the brain, this time the MRI was performed at a different location from his previous study from 10/ 2016 ( have been performed at [facility] and was interpreted by Dr. Lanora Manis). The study was interpreted by Dr. Marinus Maw.  Reading the reports it seems very similar. The new about a bilateral cerebellar hemispheres, left paramedian pons, the new about chronic sinusitis and diffuse white matter disease. Dr. Marinus Maw commented on a small area of encephalomalacia that he found slightly atypical in the left frontal lobe.  I was able to review both MRI side-by-side in the presence of the patient. I do not see a difference. I reviewed T2-weighted images and axial FLAIR.  I have the nature of seeing Steve Brown today on 08/03/2016, he has achieved 80%  compliance on his CPAP use was 5 hours and 4 minutes of daily use on average, his residual AHI is 1.3. He is using CPAP at a set pressure of 13 cm water with 3 cm EPR. He had become non compliant - and needed a new study for  Supplies and resetting of his CPAP>  He had undergone a new split-night titration study on 03/27/2016 also he had been followed in our sleep clinic since 2013. He has a history of multi-infarct cerebrovascular disease, hypertension,  diabetes, and has been followed for CPAP compliance.  His baseline AHI was confirmed at 60.1 per hour AHI, he had prolonged hypoxemia saturation below 89% was 79 minutes.  He improved drastically with CPAP saturation equaled only 8 minutes, he had no PLMS, his AHI was reduced to 4.5. Sleep efficiency with CPAP was 98%! He still has nocturia 3-4 times, and he sleeps longer than he uses CPAP. Feels less fatigued.     HPI:  This patient has been established as a sleep patient in our clinic since 2013, has a history of multi-infarct, hypertension, diabetes and was evaluated for neuropathy in 2015, was followed for CPAP compliance until last May when he was no longer compliant due to repeated sinus infections, hospitalizations etc. He stated today that he has not been able to reinitiate CPAP use since March 2017.  His primary care physician, Dr. Harrington Challenger, re- referred now for memory concerns, in a patient with known multi-infarct history but with a sliding result on his minimal mental status examinations. A note from January 5 of this year states that the patient had scored 26 out of 29 points in a high school educated African-American gentleman age 36. The patient had suffered multiple strokes and ongoing presumed TIA events, image studies were quoted below. II suspected that this is a vascular or infarct related memory loss rather than Alzheimer's disease.  It was after review of his notes from Dr Caroline Sauger at [facility] that I noticed that he had just been reevaluated for new stroke at the end of last year including imaging studies. I do not need to repeat any. His cognitive deficits are  explained with a multi-infarct dementia.   UPDATE 05/18/14 Steve Brown, who was diagnosed with severe sleep apnea at an AHI of 65.9, has made efforts to increase his CPAP compliance. CPAP was initiated to reduce his secondary CVA risk.   He stated that he had some sinusitis problems that kept him from using CPAP and has just  seen Dr. Ernesto Rutherford who prescribed no antibiotics after other measures had failed. He has COPD / Asthma and sees pulmonology. He is reluctant to use an anti-decongestant which I would also not recommend because it can spike his blood pressure. He has Flonase which Dr. Harrington Challenger his primary care physician has prescribed for him.  He is now 185 pounds. He also reports that he has a hard time sleeping on his back. Therefore when he sleeps on his sides occasionally the pillow will push the mask off his face causing a leak. He reports that he does try to exercise daily. He has continued on Xarelto and aspirin for stroke prevention. His primary care is managing his hypertension and hyperlipidemia. He denies any additional strokelike symptoms.    HISTORY HISTORY 08/14/13 (CD): Steve Brown is meanwhile 77 years old and has been staying active. He has controlled his weight he is physically active, he also has undergone a complete course of physical therapy since last visit. After out  last visit, we had obtained an MRI of the brain without contrast and compared to the study from earlier the same year : there was no change in comparison to the May CT on this MRI dated 07-04-13 . The patient has few scattered small vessel injuries and some lacunar infarcts. The main risk factor for these as hypertension and diabetes- Occupational therapy saw him for left arm pain. Additional risk factor is the patient's diabetes mellitus condition. I referred him for occupational therapy to get further insight in what may have caused his arm pain.  He noted that he could left pounds with the left 3 pounds with her right hand now there is still a significant difference .  resulting in the conduction study and EMG study which was then performed on 07-09-13, normal study no evidence of large fiber neuropathy in the left upper extremity deltoid, biceps, triceps and flexor carpi radialis as well as interosseus Muscles; no abnormal EMG activity.  He  underwent a sleep study at [facility] : results from 08-07-13 reviewed today :  Steve Brown was diagnosed with severe sleep apnea at an AHI of 65.9. Lowest point of oxygen saturation was 70%. He was titrated to CPAP at 9 cm water he slept 92.7 minutes at that pressure of which 23 minutes of REM sleep. The AHI was now 0.6 per hour. The previously very fragmented sleep architecture became now essentially normal. The technologist used a PIC all equal nasal mask. The patient is asked to return in about 50 days for a followup with the CPAP machine.  I also reviewed his long medication list : patient has been on Xeralto for chronic anticoagulation.  REVIEW OF SYSTEMS: Out of a complete 14 system review of symptoms, the patient complains only of the following symptoms, and all other reviewed systems are negative.  "i cannot recall the Sunday sermon. Non compliant with CPAP- 11 month on and off.   ALLERGIES: Allergies  Allergen Reactions  . Other Other (See Comments)    Per patient- cardiac cath dye-  "woke up during procedure hysterical."  . Phenergan [Promethazine] Other (See Comments)    Mood changes     HOME MEDICATIONS: Outpatient Medications Prior to Visit  Medication Sig Dispense Refill  . acetaminophen (TYLENOL) 500 MG tablet Take 1,000 mg by mouth every 6 (six) hours as needed.     . Ascorbic Acid (VITAMIN C PO) Take 1 tablet by mouth daily.    . BD PEN NEEDLE NANO U/F 32G X 4 MM MISC by Does not apply route.     . bisacodyl (DULCOLAX) 5 MG EC tablet Take 5 mg by mouth daily as needed for moderate constipation.    . cholecalciferol (VITAMIN D) 1000 UNITS tablet Take 1,000 Units by mouth daily.    . diclofenac sodium (VOLTAREN) 1 % GEL Apply 1 application topically daily as needed (pain).   1  . Echinacea 450 MG CAPS Take 1 capsule by mouth daily.    . Ferrous Gluconate-C-Folic Acid (IRON-C PO) Take 1 tablet by mouth daily.    . fluticasone (FLONASE) 50 MCG/ACT nasal spray Place 1  spray into both nostrils 2 (two) times daily.   0  . furosemide (LASIX) 20 MG tablet Take 20 mg by mouth daily as needed (swelling).     Marland Kitchen HUMALOG MIX 75/25 KWIKPEN (75-25) 100 UNIT/ML Kwikpen Inject 6-10 Units into the skin 3 (three) times daily with meals.   1  . Insulin Glargine (TOUJEO SOLOSTAR) 300 UNIT/ML  SOPN Inject 25 Units into the skin every morning.    . MECLIZINE HCL PO Take 2 capsules by mouth daily.    . metFORMIN (GLUCOPHAGE) 500 MG tablet Take 500 mg by mouth as needed (Diabetes).    . Multiple Vitamin (MULTI-DAY PO) Take 1 tablet by mouth daily.    . nitroGLYCERIN (NITROSTAT) 0.4 MG SL tablet Place 1 tablet (0.4 mg total) under the tongue every 5 (five) minutes as needed for chest pain. 25 tablet 12  . pantoprazole (PROTONIX) 40 MG tablet Take 40 mg by mouth daily.     . pregabalin (LYRICA) 300 MG capsule Take 1 capsule (300 mg total) by mouth 2 (two) times daily. 180 capsule 3  . rivaroxaban (XARELTO) 20 MG TABS tablet Take 1 tablet (20 mg total) by mouth daily. Resume in 72 hours after procedure. 30 tablet   . rosuvastatin (CRESTOR) 10 MG tablet Take 10 mg by mouth every evening.     . tamsulosin (FLOMAX) 0.4 MG CAPS capsule Take 0.4 mg by mouth daily.    . valACYclovir (VALTREX) 500 MG tablet Take 500 mg by mouth daily.     No facility-administered medications prior to visit.     PAST MEDICAL HISTORY: Past Medical History:  Diagnosis Date  . Abnormal prostate biopsy   . Anticoagulant long-term use    currently xarelto  . BPH with elevated PSA   . CKD (chronic kidney disease), stage II   . Complication of anesthesia    limted neck rom limited use of left arm due to cva  . Coronary artery disease    CARDIOLOGIST-  DR Irish Lack--  2010-- PCI w/ stenting midLAD  . DDD (degenerative disc disease), lumbar   . Degeneration of cervical intervertebral disc   . Depression   . Dyspnea on exertion   . GERD (gastroesophageal reflux disease)   . Hemiparesis due to cerebral  infarction   . History of cerebrovascular accident (CVA) with residual deficit 2002 and 2003--  hemiparisis both sides   per MRI  anterior left frontal lobe, left para midline pons, and inferior cerebullam bilaterally infarcts  . History of pulmonary embolus (PE)    06-30-2012  extensive bilaterally  . History of recurrent TIAs   . History of syncope    hx multiple pre-syncope and syncopal episodes due to vasovagal, orthostatic hypotension, dehydration  . History of TIAs    several since 2002  . Hyperlipidemia   . Hypertension   . Mild atherosclerosis of carotid artery, bilateral    per last duplex 11-04-2014  bilateral ICA 1--39%  . Neuropathy    fingers  . OSA on CPAP    followed by dr Mikel Hardgrove--  sev. osa w/ AHI 65.9  . Prostate cancer ([place]) dx 2018  . S/P coronary artery stent placement 2010   stenting to mid LAD  . Simple renal cyst    bilaterally  . Trigger finger of both hands 11-17-13  . Type 2 diabetes mellitus ([place]) dx 1986   last one A1c 9.2 on 04-26-2016  . Unsteady gait    . Hx prior CVA/TIAs;  . Vertebral artery occlusion, left    chronic    PAST SURGICAL HISTORY: Past Surgical History:  Procedure Laterality Date  . ANTERIOR CERVICAL DECOMP/DISCECTOMY FUSION  2004   C3 -- C6 limited rom  . CARDIAC CATHETERIZATION  06-10-2010   dr Irish Lack   wide patent LAD stent, mid lesion at the origin of the septal prior to the previous stent  40-50%/  normal LVF, ef 55%  . CARDIOVASCULAR STRESS TEST  10-23-2012  dr Irish Lack   normal nuclear perfusion study w/ no ischemia/  normal LV function and wall motion , ef 65%  . CARPAL TUNNEL RELEASE Bilateral   . CATARACT EXTRACTION W/ INTRAOCULAR LENS  IMPLANT, BILATERAL    . CHOLECYSTECTOMY N/A 11/02/2015   Procedure: LAPAROSCOPIC CHOLECYSTECTOMY WITH INTRAOPERATIVE CHOLANGIOGRAM;  Surgeon: Donnie Mesa, MD;  Location: [facility];  Service: General;  Laterality: N/A;  . COLONOSCOPY    . CORONARY ANGIOPLASTY WITH STENT PLACEMENT   02/2008   stenting to mid LAD  . GOLD SEED IMPLANT N/A 11/15/2016   Procedure: GOLD SEED IMPLANT TIMES THREE;  Surgeon: Ardis Hughs, MD;  Location: [facility];  Service: Urology;  Laterality: N/A;  . LEFT HEART CATH AND CORONARY ANGIOGRAPHY N/A 05/25/2017   Procedure: LEFT HEART CATH AND CORONARY ANGIOGRAPHY;  Surgeon: Jettie Booze, MD;  Location: [facility];  Service: Cardiovascular;  Laterality: N/A;  . LEFT HEART CATHETERIZATION WITH CORONARY ANGIOGRAM N/A 04/03/2013   Procedure: LEFT HEART CATHETERIZATION WITH CORONARY ANGIOGRAM;  Surgeon: Jettie Booze, MD;  Location: [facility] C[facility] LAB;  Service: Cardiovascular;  Laterality: N/A;  patent mLAD stent  w/ mild disease in remainder LAD and its branches;  mod. focal lesion midLCFx- FFR of lesion was negative for ischemia/  normal LVSF, ef 50%  . lungs  2005   "fluid pumped off lungs"  . NEUROPLASTY / TRANSPOSITION ULNAR NERVE AT ELBOW Right 2004  . PROSTATE BIOPSY N/A 08/31/2016   Procedure: PROSTATE  BIOPSY TRANSRECTAL ULTRASONIC PROSTATE (TUBP);  Surgeon: Ardis Hughs, MD;  Location: [facility];  Service: Urology;  Laterality: N/A;  . SPACE OAR INSTILLATION N/A 11/15/2016   Procedure: SPACE OAR INSTILLATION;  Surgeon: Ardis Hughs, MD;  Location: [facility];  Service: Urology;  Laterality: N/A;  . TRANSTHORACIC ECHOCARDIOGRAM  04/27/2016   severe focal basal LVH, ef 60-65%,  grade 2 diastoilc dysfunction/  mild AR, MR, and TR/  atrial septum lipomatous hypertrophy/  PASP 87mmHg  . UMBILICAL HERNIA REPAIR      FAMILY HISTORY: Family History  Problem Relation Age of Onset  . Aneurysm Mother   . Cancer Father        unknown either pancreatic or prostate  . Heart attack Neg Hx     SOCIAL HISTORY: Social History   Socioeconomic History  . Marital status: Divorced    Spouse name: Not on file  . Number of children: 4  . Years of education: 33  . Highest  education level: Not on file  Occupational History    Employer: RETIRED    Comment: retired  Scientific laboratory technician  . Financial resource strain: Not on file  . Food insecurity:    Worry: Not on file    Inability: Not on file  . Transportation needs:    Medical: Not on file    Non-medical: Not on file  Tobacco Use  . Smoking status: Never Smoker  . Smokeless tobacco: Never Used  Substance and Sexual Activity  . Alcohol use: No    Alcohol/week: 0.0 oz  . Drug use: No  . Sexual activity: Yes  Lifestyle  . Physical activity:    Days per week: Not on file    Minutes per session: Not on file  . Stress: Not on file  Relationships  . Social connections:    Talks on phone: Not on file  Gets together: Not on file    Attends religious service: Not on file    Active member of club or organization: Not on file    Attends meetings of clubs or organizations: Not on file    Relationship status: Not on file  . Intimate partner violence:    Fear of current or ex partner: Not on file    Emotionally abused: Not on file    Physically abused: Not on file    Forced sexual activity: Not on file  Other Topics Concern  . Not on file  Social History Narrative   Patient lives at home alone and he is single.  Patient is retired.    Caffeine - one cups daily.   Right handed.   Patient has a high school education.   Patient has four adult children.      PHYSICAL EXAM  Vitals:   07/31/17 1105 07/31/17 1107 07/31/17 1108  BP: 117/66 110/62 122/65  Pulse: (!) 57 64 68  Weight: 187 lb (84.8 kg)    Height: 5\' 5"  (1.651 m)     Body mass index is 31.12 kg/m.  Generalized: Well developed, in no acute distress . Seems depressed.   Pale mucous lining.   Neurological examination  Mentation: Alert oriented to time, place, history taking.   MMSE - Mini Mental State Exam 07/31/2017  Orientation to time 5  Orientation to Place 5  Registration 3  Attention/ Calculation 3  Recall 2  Language- name  2 objects 2  Language- repeat 1  Language- follow 3 step command 3  Language- read & follow direction 1  Write a sentence 1  Copy design 0  Total score 26     Montreal Cognitive Assessment  02/21/2016  Visuospatial/ Executive (0/5) 3  Naming (0/3) 3  Attention: Read list of digits (0/2) 2  Attention: Read list of letters (0/1) 0  Attention: Serial 7 subtraction starting at 100 (0/3) 0  Language: Repeat phrase (0/2) 2  Language : Fluency (0/1) 1  Abstraction (0/2) 2  Delayed Recall (0/5) 5  Orientation (0/6) 5  Total 23  Adjusted Score (based on education) 24    DIAGNOSTIC DATA (LABS, IMAGING, TESTING) - I reviewed patient records, labs, notes, testing and imaging myself where available.   Lab Results  Component Value Date   WBC 4.0 05/17/2017   HGB 12.5 (L) 05/17/2017   HCT 38.6 05/17/2017   MCV 96 05/17/2017   PLT 212 05/17/2017     General: The patient is awake, alert and appears in no acute distress. The patient is well groomed. HEENT: Normocephalic, atraumatic.  Face is symmetric without facial masking.  Pupils are equal, round & reactive to light & accomodation.  Extraocular tracking  no nystagmus noted.    Mallampati is class  3.  Tongue protrudes centrally & palate elevates symmetrically.  Neck:  Supple.ring grossly intact.  Speech is clear , mildly hypophonic with no dysarthria noted.   There is no lip, neck or jaw tremor.  Neck is mildly rigid.  Oropharynx exam reveals  Mental Status:  The patient is awake, alert, oriented to all 4 spheres.  His memory, language & knowledge are appropriate.  Thre is no aphasia, agnosia, apraxia or anomia.  Speech is clear with normal prosody & enunciation.  Thought process is linear.   Cranial nerves: Described above under HEENT exam.  In addition, shoulder shrug is normal with equal shoulder height noted. Sensory:  Fine touch, pinprick, vibration & temperature  sensation were well-preserved.  Motor exam:   Normal tone and normal  muscle bulk and symmetric normal strength in all extremities.  No resting tremor, no postural or action tremor.  R Cerebellar testing:  No dysmetria or intention tremor on finger-to-nose testing.  There is no truncal or gait ataxia.    ASSESSMENT AND PLAN 77 y.o. year old male who has been followed for CPAP compliance in the sleep clinic of the [place] office,  has now been non compliant with CPAP since March 2017, almost a year.   Multi infarct dementia- stroke prevention is main goal, I will ask him to keep his follow ups with Dr. Caroline Sauger at [facility] neuro-vascular clinic.  His MRI of the brain : There was a 4 mm lesion newly noted, but in an area that would bot affect balance or vestibular function.  Cervical spine myelomalacia. Prostrate cancer related anemia and low WBC,, bone marrow reduced capacity. Radiation or primary cancer effect? He has not had chemotherapy he stated.   He is at higher risk of embolic strokes as long as OSA was untreated. He is anticoagulated.  His apnea was severe at AHI 60 and I have due to his non compliance re tested him less than 3 years ago to get new CPAP co supplies, but we have now the same situaion again. He had hypoxemia- and will not respond to dental device, may respond to an inspire procedure.   He was not thrilled with that option.  He was given a new mask from my own supplies.  He will be evaluated at vestibular rehab for Vertigo.  He needs orthostatic BP with next visits.   He will from now on see my NP , next visit in 3 month.   Larey Seat, MD  07/31/2017, 11:20 AM [facility] [address], [place], [place] [address] [phone]

## 2017-07-31 NOTE — Patient Instructions (Signed)
Orthostatic Hypotension Orthostatic hypotension is a sudden drop in blood pressure that happens when you quickly change positions, such as when you get up from a seated or lying position. Blood pressure is a measurement of how strongly, or weakly, your blood is pressing against the walls of your arteries. Arteries are blood vessels that carry blood from your heart throughout your body. When blood pressure is too low, you may not get enough blood to your brain or to the rest of your organs. This can cause weakness, light-headedness, rapid heartbeat, and fainting. This can last for just a few seconds or for up to a few minutes. Orthostatic hypotension is usually not a serious problem. However, if it happens frequently or gets worse, it may be a sign of something more serious. What are the causes? This condition may be caused by:  Sudden changes in posture, such as standing up quickly after you have been sitting or lying down.  Blood loss.  Loss of body fluids (dehydration).  Heart problems.  Hormone (endocrine) problems.  Pregnancy.  Severe infection.  Lack of certain nutrients.  Severe allergic reactions (anaphylaxis).  Certain medicines, such as blood pressure medicine or medicines that make the body lose excess fluids (diuretics). Sometimes, this condition can be caused by not taking medicine as directed, such as taking too much of a certain medicine.  What increases the risk? Certain factors can make you more likely to develop orthostatic hypotension, including:  Age. Risk increases as you get older.  Conditions that affect the heart or the central nervous system.  Taking certain medicines, such as blood pressure medicine or diuretics.  Being pregnant.  What are the signs or symptoms? Symptoms of this condition may include:  Weakness.  Light-headedness.  Dizziness.  Blurred vision.  Fatigue.  Rapid heartbeat.  Fainting, in severe cases.  How is this  diagnosed? This condition is diagnosed based on:  Your medical history.  Your symptoms.  Your blood pressure measurement. Your health care provider will check your blood pressure when you are: ? Lying down. ? Sitting. ? Standing.  A blood pressure reading is recorded as two numbers, such as "120 over 80" (or 120/80). The first ("top") number is called the systolic pressure. It is a measure of the pressure in your arteries as your heart beats. The second ("bottom") number is called the diastolic pressure. It is a measure of the pressure in your arteries when your heart relaxes between beats. Blood pressure is measured in a unit called mm Hg. Healthy blood pressure for adults is 120/80. If your blood pressure is below 90/60, you may be diagnosed with hypotension. Other information or tests that may be used to diagnose orthostatic hypotension include:  Your other vital signs, such as your heart rate and temperature.  Blood tests.  Tilt table test. For this test, you will be safely secured to a table that moves you from a lying position to an upright position. Your heart rhythm and blood pressure will be monitored during the test.  How is this treated? Treatment for this condition may include:  Changing your diet. This may involve eating more salt (sodium) or drinking more water.  Taking medicines to raise your blood pressure.  Changing the dosage of certain medicines you are taking that might be lowering your blood pressure.  Wearing compression stockings. These stockings help to prevent blood clots and reduce swelling in your legs.  In some cases, you may need to go to the hospital for:    Fluid replacement. This means you will receive fluids through an IV tube.  Blood replacement. This means you will receive donated blood through an IV tube (transfusion).  Treating an infection or heart problems, if this applies.  Monitoring. You may need to be monitored while medicines that you  are taking wear off.  Follow these instructions at home: Eating and drinking   Drink enough fluid to keep your urine clear or pale yellow.  Eat a healthy diet and follow instructions from your health care provider about eating or drinking restrictions. A healthy diet includes: ? Fresh fruits and vegetables. ? Whole grains. ? Lean meats. ? Low-fat dairy products.  Eat extra salt only as directed. Do not add extra salt to your diet unless your health care provider told you to do that.  Eat frequent, small meals.  Avoid standing up suddenly after eating. Medicines  Take over-the-counter and prescription medicines only as told by your health care provider. ? Follow instructions from your health care provider about changing the dosage of your current medicines, if this applies. ? Do not stop or adjust any of your medicines on your own. General instructions  Wear compression stockings as told by your health care provider.  Get up slowly from lying down or sitting positions. This gives your blood pressure a chance to adjust.  Avoid hot showers and excessive heat as directed by your health care provider.  Return to your normal activities as told by your health care provider. Ask your health care provider what activities are safe for you.  Do not use any products that contain nicotine or tobacco, such as cigarettes and e-cigarettes. If you need help quitting, ask your health care provider.  Keep all follow-up visits as told by your health care provider. This is important. Contact a health care provider if:  You vomit.  You have diarrhea.  You have a fever for more than 2-3 days.  You feel more thirsty than usual.  You feel weak and tired. Get help right away if:  You have chest pain.  You have a fast or irregular heartbeat.  You develop numbness in any part of your body.  You cannot move your arms or your legs.  You have trouble speaking.  You become sweaty or feel  lightheaded.  You faint.  You feel short of breath.  You have trouble staying awake.  You feel confused. This information is not intended to replace advice given to you by your health care provider. Make sure you discuss any questions you have with your health care provider. Document Released: 12/16/2001 Document Revised: 09/14/2015 Document Reviewed: 06/18/2015 Elsevier Interactive Patient Education  2018 Elsevier Inc.  

## 2017-08-07 ENCOUNTER — Ambulatory Visit: Payer: Medicare Other | Admitting: Adult Health

## 2017-08-09 ENCOUNTER — Ambulatory Visit
Admission: RE | Admit: 2017-08-09 | Discharge: 2017-08-09 | Disposition: A | Discharge status: Home / Self Care | Payer: Medicare Other | Source: Ambulatory Visit | Attending: Family Medicine | Admitting: Family Medicine

## 2017-08-09 DIAGNOSIS — I6523 Occlusion and stenosis of bilateral carotid arteries: Secondary | ICD-10-CM | POA: Diagnosis not present

## 2017-08-09 DIAGNOSIS — I679 Cerebrovascular disease, unspecified: Secondary | ICD-10-CM

## 2017-08-14 DIAGNOSIS — E1042 Type 1 diabetes mellitus with diabetic polyneuropathy: Secondary | ICD-10-CM | POA: Diagnosis not present

## 2017-08-28 ENCOUNTER — Ambulatory Visit: Payer: Medicare Other | Attending: Neurology | Admitting: Physical Therapy

## 2017-08-28 DIAGNOSIS — R2689 Other abnormalities of gait and mobility: Secondary | ICD-10-CM | POA: Insufficient documentation

## 2017-08-28 DIAGNOSIS — R2681 Unsteadiness on feet: Secondary | ICD-10-CM | POA: Insufficient documentation

## 2017-08-28 DIAGNOSIS — R42 Dizziness and giddiness: Secondary | ICD-10-CM | POA: Insufficient documentation

## 2017-08-29 ENCOUNTER — Other Ambulatory Visit: Payer: Self-pay

## 2017-08-29 ENCOUNTER — Encounter: Payer: Self-pay | Admitting: Physical Therapy

## 2017-08-29 DIAGNOSIS — R05 Cough: Secondary | ICD-10-CM | POA: Diagnosis not present

## 2017-08-29 NOTE — Therapy (Signed)
[facility] [address] [place], [place], [address] Phone: [phone]   Fax:  [phone]  Physical Therapy Evaluation  Patient Details  Name: Steve Brown MRN: [id number] Date of Birth: 03/24/1940 Referring Provider: Asencion Partridge Dohmeier   Encounter Date: 08/28/2017  PT End of Session - 08/29/17 2100    Visit Number  1    Number of Visits  9    Date for PT Re-Evaluation  09/28/17    Authorization Type  Medicare and [employer]    Authorization Time Period  08-28-17 - 11-26-17    PT Start Time  1017    PT Stop Time  1101    PT Time Calculation (min)  44 min       Past Medical History:  Diagnosis Date  . Abnormal prostate biopsy   . Anticoagulant long-term use    currently xarelto  . BPH with elevated PSA   . CKD (chronic kidney disease), stage II   . Complication of anesthesia    limted neck rom limited use of left arm due to cva  . Coronary artery disease    CARDIOLOGIST-  DR Irish Lack--  2010-- PCI w/ stenting midLAD  . DDD (degenerative disc disease), lumbar   . Degeneration of cervical intervertebral disc   . Depression   . Dyspnea on exertion   . GERD (gastroesophageal reflux disease)   . Hemiparesis due to cerebral infarction   . History of cerebrovascular accident (CVA) with residual deficit 2002 and 2003--  hemiparisis both sides   per MRI  anterior left frontal lobe, left para midline pons, and inferior cerebullam bilaterally infarcts  . History of pulmonary embolus (PE)    06-30-2012  extensive bilaterally  . History of recurrent TIAs   . History of syncope    hx multiple pre-syncope and syncopal episodes due to vasovagal, orthostatic hypotension, dehydration  . History of TIAs    several since 2002  . Hyperlipidemia   . Hypertension   . Mild atherosclerosis of carotid artery, bilateral    per last duplex 11-04-2014  bilateral ICA 1--39%  . Neuropathy    fingers  . OSA on CPAP    followed by dr  dohmeier--  sev. osa w/ AHI 65.9  . Prostate cancer ([place]) dx 2018  . S/P coronary artery stent placement 2010   stenting to mid LAD  . Simple renal cyst    bilaterally  . Trigger finger of both hands 11-17-13  . Type 2 diabetes mellitus ([place]) dx 1986   last one A1c 9.2 on 04-26-2016  . Unsteady gait    . Hx prior CVA/TIAs;  . Vertebral artery occlusion, left    chronic    Past Surgical History:  Procedure Laterality Date  . ANTERIOR CERVICAL DECOMP/DISCECTOMY FUSION  2004   C3 -- C6 limited rom  . CARDIAC CATHETERIZATION  06-10-2010   dr Irish Lack   wide patent LAD stent, mid lesion at the origin of the septal prior to the previous stent 40-50%/  normal LVF, ef 55%  . CARDIOVASCULAR STRESS TEST  10-23-2012  dr Irish Lack   normal nuclear perfusion study w/ no ischemia/  normal LV function and wall motion , ef 65%  . CARPAL TUNNEL RELEASE Bilateral   . CATARACT EXTRACTION W/ INTRAOCULAR LENS  IMPLANT, BILATERAL    . CHOLECYSTECTOMY N/A 11/02/2015   Procedure: LAPAROSCOPIC CHOLECYSTECTOMY WITH INTRAOPERATIVE CHOLANGIOGRAM;  Surgeon: Donnie Mesa, MD;  Location: [facility];  Service: General;  Laterality: N/A;  .  COLONOSCOPY    . CORONARY ANGIOPLASTY WITH STENT PLACEMENT  02/2008   stenting to mid LAD  . GOLD SEED IMPLANT N/A 11/15/2016   Procedure: GOLD SEED IMPLANT TIMES THREE;  Surgeon: Ardis Hughs, MD;  Location: [facility];  Service: Urology;  Laterality: N/A;  . LEFT HEART CATH AND CORONARY ANGIOGRAPHY N/A 05/25/2017   Procedure: LEFT HEART CATH AND CORONARY ANGIOGRAPHY;  Surgeon: Jettie Booze, MD;  Location: [facility];  Service: Cardiovascular;  Laterality: N/A;  . LEFT HEART CATHETERIZATION WITH CORONARY ANGIOGRAM N/A 04/03/2013   Procedure: LEFT HEART CATHETERIZATION WITH CORONARY ANGIOGRAM;  Surgeon: Jettie Booze, MD;  Location: [facility] CATH [facility];  Service: Cardiovascular;  Laterality: N/A;  patent mLAD stent  w/ mild disease in remainder LAD  and its branches;  mod. focal lesion midLCFx- FFR of lesion was negative for ischemia/  normal LVSF, ef 50%  . lungs  2005   "fluid pumped off lungs"  . NEUROPLASTY / TRANSPOSITION ULNAR NERVE AT ELBOW Right 2004  . PROSTATE BIOPSY N/A 08/31/2016   Procedure: PROSTATE  BIOPSY TRANSRECTAL ULTRASONIC PROSTATE (TUBP);  Surgeon: Ardis Hughs, MD;  Location[facility];  Service: Urology;  Laterality: N/A;  . SPACE OAR INSTILLATION N/A 11/15/2016   Procedure: SPACE OAR INSTILLATION;  Surgeon: Ardis Hughs, MD;  Location: [facility];  Service: Urology;  Laterality: N/A;  . TRANSTHORACIC ECHOCARDIOGRAM  04/27/2016   severe focal basal LVH, ef 60-65%,  grade 2 diastoilc dysfunction/  mild AR, MR, and TR/  atrial septum lipomatous hypertrophy/  PASP 67mmHg  . UMBILICAL HERNIA REPAIR      There were no vitals filed for this visit.   Subjective Assessment - 08/29/17 2047    Subjective  Pt states his balance is not good - goes to [facility] and walks daily in the mornings; pt states he has had PT at this facility in the past, but does not feel that it has been that beneficial; pt states vertigo is constant    Pertinent History  h/o CVA's - 2002 and 2003; h/o TIA's; vertigo due to cerebrovascular disease and as late effect of CVA (per referral); lumbar DDD (radiculopathy?) pt reports LBP with standing; Lt heart catherization 05-25-17; DOE    Patient Stated Goals  "Try to minimize the situation - help with balance and vertigo"    Currently in Pain?  Yes    Pain Score  8     Pain Orientation  Left    Pain Descriptors / Indicators  Aching;Burning;Stabbing;Pins and needles    Pain Type  Chronic pain;Neuropathic pain    Pain Onset  More than a month ago    Pain Frequency  Constant    Pain Relieving Factors  pt states he is scheduled to receive another injection          [facility] PT Assessment - 08/29/17 0001      Assessment   Medical Diagnosis  Vertigo; Gait  instability    Referring Provider  Asencion Partridge Dohmeier    Onset Date/Surgical Date  --   May 2019   Prior Therapy  Pt had PT evals only in Nov. 2016 and March 2017      Precautions   Precautions  Fall      Restrictions   Weight Bearing Restrictions  No      Balance Screen   Has the patient fallen in the past 6 months  No    Has the  patient had a decrease in activity level because of a fear of falling?   No    Is the patient reluctant to leave their home because of a fear of falling?   No      Home Environment   Living Environment  Private residence    Living Arrangements  Alone    Type of [place] to enter    Entrance Stairs-Number of Steps  18    Entrance Stairs-Rails  Left    Home Layout  One level      ROM / Strength   AROM / PROM / Strength  Strength      Strength   Overall Strength  Within functional limits for tasks performed    Strength Assessment Site  Hip;Knee;Ankle    Right/Left Hip  Right;Left      Transfers   Transfers  Sit to Stand    Number of Reps  Other reps (comment)   1   Comments  no UE support used       Ambulation/Gait   Ambulation/Gait  Yes    Ambulation/Gait Assistance  6: Modified independent (Device/Increase time)    Ambulation Distance (Feet)  100 Feet    Assistive device  None    Gait Pattern  Step-through pattern    Ambulation Surface  Level;Indoor    Gait velocity  13.57 = 2.42 ft/sec      Standardized Balance Assessment   Standardized Balance Assessment  Timed Up and Go Test      Timed Up and Go Test   Normal TUG (seconds)  15.81   no device               Objective measurements completed on examination: See above findings.                   PT Long Term Goals - 08/29/17 2125      PT LONG TERM GOAL #1   Title  Improve TUG score from 15.81 secs to </= 13.5 secs without device to decr. fall risk.    Baseline  15.81 secs on 08-28-17    Time  4    Period  Weeks    Status  New     Target Date  09/28/17      PT LONG TERM GOAL #2   Title  Increase gait velocity to >/= 2.7 ft/sec without device for incr. gait efficiency.      Baseline  2.42 ft/sec = 13.57 secs    Time  4    Period  Weeks    Status  New    Target Date  09/28/17      PT LONG TERM GOAL #3   Title  Increase distance in 3" walk test by at least 75' to demo improved endurance.    Baseline  3" walk test to be completed     Time  4    Status  New    Target Date  09/28/17      PT LONG TERM GOAL #4   Title  Independent in HEP for balance exs.    Time  4    Period  Weeks    Status  New    Target Date  09/28/17             Plan - 08/29/17 2102    Clinical Impression Statement  Pt is a 77 yr old gentleman with c/o chronic vertigo of  central etiology per MD's notes, c/o left sided pain, LLE pain with prolonged standing due to lumbar DDD, and DOE due to cardiac dysfunction.  Pt presents with decreased balance, decreased gait speed and decreased endurance/activity tolerance.  Pt presents with minimally decreased high level balance skills with pt able to stand on LLE 5.38 secs and on RLE 12.28 secs.   Pt reports dizziness 8/10 at time of eval.      History and Personal Factors relevant to plan of care:  Pt goes to [facility] daily to walk or do [employer]ne[employer] class: pt lives alone; h/o CVA's with constant vertigo    Clinical Presentation  Stable    Clinical Presentation due to:  vertigo due to cerebrovascular disease; decreased endurance due to cardiac dysfunction    Clinical Decision Making  Low    Rehab Potential  Good    PT Frequency  2x / week    PT Duration  4 weeks    PT Treatment/Interventions  ADLs/Self Care Home Management;DME Instruction;Stair training;Gait training;Therapeutic activities;Therapeutic exercise;Balance training;Neuromuscular re-education;Patient/family education;Vestibular    PT Next Visit Plan  begin HEP for balance; do 3" walk test; try standing on compliant surface    PT Home  Exercise Plan  see above    Consulted and Agree with Plan of Care  Patient       Patient will benefit from skilled therapeutic intervention in order to improve the following deficits and impairments:  Cardiopulmonary status limiting activity, Decreased activity tolerance, Decreased balance, Dizziness, Pain, Abnormal gait, Decreased strength, Decreased endurance  Visit Diagnosis: Other abnormalities of gait and mobility - Plan: PT plan of care cert/re-cert  Dizziness and giddiness - Plan: PT plan of care cert/re-cert  Unsteadiness on feet - Plan: PT plan of care cert/re-cert     Problem List Patient Active Problem List   Diagnosis Date Noted  . Radiation therapy complication 78/93/8101  . Primary prostate cancer ([place]) 07/31/2017  . Anemia 07/31/2017  . Vertigo due to cerebrovascular disease 07/31/2017  . Poor compliance with CPAP treatment 07/31/2017  . Absolute anemia 05/16/2017  . Avitaminosis D 05/16/2017  . Benign essential HTN 05/16/2017  . Benign prostatic hypertrophy without urinary obstruction 05/16/2017  . Clinical depression 05/16/2017  . CN (constipation) 05/16/2017  . Current drug use 05/16/2017  . Diabetic neuropathy ([place]) 05/16/2017  . Genital herpes 05/16/2017  . Infarction of lung due to iatrogenic pulmonary embolism ([place]) 05/16/2017  . Peripheral neuralgia 05/16/2017  . Arteriosclerosis of coronary artery 05/16/2017  . Artery disease, cerebral 05/16/2017  . Apnea, sleep 05/16/2017  . Arthralgia of hip or thigh 05/16/2017  . Malignant neoplasm of prostate ([place]) 09/28/2016  . Vascular dementia in remission 09/28/2016  . Remote history of stroke 09/28/2016  . Acute kidney injury ([place]) 05/18/2016  . Dehydration 05/18/2016  . Near syncope 05/18/2016  . Orthostatic hypotension 05/18/2016  . CKD (chronic kidney disease), stage II 04/26/2016  . UTI (urinary tract infection) 04/26/2016  . Encounter for counseling on use of CPAP 11/18/2015  . Chronic  cholecystitis with calculus 11/02/2015  . 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 TIA (transient ischemic attack) 11/03/2014  . OSA on CPAP 05/18/2014  . White matter disease 08/14/2013  . OSA (obstructive sleep apnea) 08/14/2013  . Abnormal x-ray of temporomandibular joint 05/30/2013  . Chronic infection of sinus 05/30/2013  . Cough 05/30/2013  . Fatigue 05/30/2013  . Unsteady gait 05/19/2013  . Combined fat and carbohydrate induced hyperlipemia 04/24/2013  . Syncope 03/20/2013  .  Angina pectoris ([place]) 10/08/2012  . Hypertension   . Stroke ([place])   . History of TIAs   . Lumbago   . Other and unspecified hyperlipidemia   . Personal history of unspecified circulatory disease   . Unspecified fall   . Pain in joint, multiple sites   . Degeneration of cervical intervertebral disc   . Unspecified cardiovascular disease   . History of pulmonary embolism: June 2014,  Takes Xarelto 07/01/2012    Class: History of  . Hemiparesis ([place]) 07/18/2011  . Diabetes mellitus type 2 with complications ([place]) 76/22/6333  . HTN (hypertension) 07/18/2011  . CAD in native artery 07/18/2011  . History of recurrent TIAs 07/18/2011    Alda Lea, PT 08/29/2017, 9:35 PM  [facility] [address] [place], [place], [address] Phone: [phone]   Fax:  [phone]  Name: Steve Brown MRN: [id number] Date of Birth: November 18, 1940

## 2017-09-03 ENCOUNTER — Encounter: Payer: Self-pay | Admitting: Physical Therapy

## 2017-09-03 ENCOUNTER — Ambulatory Visit: Payer: Medicare Other | Admitting: Physical Therapy

## 2017-09-03 VITALS — BP 125/64 | HR 65

## 2017-09-03 DIAGNOSIS — R2689 Other abnormalities of gait and mobility: Secondary | ICD-10-CM

## 2017-09-03 DIAGNOSIS — R2681 Unsteadiness on feet: Secondary | ICD-10-CM

## 2017-09-03 DIAGNOSIS — M5136 Other intervertebral disc degeneration, lumbar region: Secondary | ICD-10-CM | POA: Diagnosis not present

## 2017-09-03 DIAGNOSIS — R42 Dizziness and giddiness: Secondary | ICD-10-CM

## 2017-09-03 DIAGNOSIS — M79605 Pain in left leg: Secondary | ICD-10-CM | POA: Diagnosis not present

## 2017-09-03 DIAGNOSIS — G894 Chronic pain syndrome: Secondary | ICD-10-CM | POA: Diagnosis not present

## 2017-09-03 DIAGNOSIS — M5417 Radiculopathy, lumbosacral region: Secondary | ICD-10-CM | POA: Diagnosis not present

## 2017-09-03 NOTE — Patient Instructions (Signed)
Standing Marching   Using a chair if necessary, march in place. Repeat 10 times. Do 1 sessions per day.  http://gt2.exer.us/344   Copyright  VHI. All rights reserved.   Hip Backward Kick   Using a chair for balance, keep legs shoulder width apart and toes pointed for- ward. Slowly extend one leg back, keeping knee straight. Do not lean forward. Repeat with other leg. Repeat 10 times. Do 1 sessions per day.  http://gt2.exer.us/340   Copyright  VHI. All rights reserved.     Hip Side Kick   Holding a chair for balance, keep legs shoulder width apart and toes pointed forward. Swing a leg out to side, keeping knee straight. Do not lean. Repeat using other leg. Repeat 10  times. Do 1 sessions per day.   ALSO DO FORWARD KICKS - 10 reps alternating legs  Feet Heel-Toe "Tandem", Varied Arm Positions - Eyes Open   With eyes open, right foot directly in front of the other, arms out, look straight ahead at a stationary object. Hold 30  seconds. Repeat 1-2 times per session. Do 1 sessions per day.  Copyright  VHI. All rights reserved.       Standing On One Leg Without Support .  Stand on one leg in neutral spine without support. Hold 10 seconds. Repeat on other leg. Do 2 repetitions EACH LEG   .             Braiding  - crossovers front and then stepping behind - separately  Move to side: 1) cross right leg in front of left,  , then 3) step right leg behind left leg ,     Feet Apart (Compliant Surface) Head Motion - Eyes Open AND THEN EYES CLOSED     With eyes open, standing on compliant surface: _pillows_______, feet shoulder width apart, move head slowly: up and down. Repeat __1__ times per session. Do _1-2___ sessions per day. DO HEAD TURNS SIDE TO SIDE 5 X AND THEN UP/DOWN 5 X

## 2017-09-03 NOTE — Therapy (Signed)
[facility] [address] [place], [place], [address] Phone: [phone]   Fax:  [phone]  Physical Therapy Treatment  Patient Details  Name: Steve Brown MRN: [id number] Date of Birth: Aug 27, 1940 Referring Provider: Asencion Partridge Dohmeier   Encounter Date: 09/03/2017  PT End of Session - 09/03/17 1131    Visit Number  2    Number of Visits  9    Date for PT Re-Evaluation  09/28/17    Authorization Type  Medicare and [facility]    Authorization Time Period  08-28-17 - 11-26-17    PT Start Time  1015    PT Stop Time  1114    PT Time Calculation (min)  59 min       Past Medical History:  Diagnosis Date  . Abnormal prostate biopsy   . Anticoagulant long-term use    currently xarelto  . BPH with elevated PSA   . CKD (chronic kidney disease), stage II   . Complication of anesthesia    limted neck rom limited use of left arm due to cva  . Coronary artery disease    CARDIOLOGIST-  DR Irish Lack--  2010-- PCI w/ stenting midLAD  . DDD (degenerative disc disease), lumbar   . Degeneration of cervical intervertebral disc   . Depression   . Dyspnea on exertion   . GERD (gastroesophageal reflux disease)   . Hemiparesis due to cerebral infarction   . History of cerebrovascular accident (CVA) with residual deficit 2002 and 2003--  hemiparisis both sides   per MRI  anterior left frontal lobe, left para midline pons, and inferior cerebullam bilaterally infarcts  . History of pulmonary embolus (PE)    06-30-2012  extensive bilaterally  . History of recurrent TIAs   . History of syncope    hx multiple pre-syncope and syncopal episodes due to vasovagal, orthostatic hypotension, dehydration  . History of TIAs    several since 2002  . Hyperlipidemia   . Hypertension   . Mild atherosclerosis of carotid artery, bilateral    per last duplex 11-04-2014  bilateral ICA 1--39%  . Neuropathy    fingers  . OSA on CPAP    followed by dr  dohmeier--  sev. osa w/ AHI 65.9  . Prostate cancer ([place]) dx 2018  . S/P coronary artery stent placement 2010   stenting to mid LAD  . Simple renal cyst    bilaterally  . Trigger finger of both hands 11-17-13  . Type 2 diabetes mellitus ([place]) dx 1986   last one A1c 9.2 on 04-26-2016  . Unsteady gait    . Hx prior CVA/TIAs;  . Vertebral artery occlusion, left    chronic    Past Surgical History:  Procedure Laterality Date  . ANTERIOR CERVICAL DECOMP/DISCECTOMY FUSION  2004   C3 -- C6 limited rom  . CARDIAC CATHETERIZATION  06-10-2010   dr Irish Lack   wide patent LAD stent, mid lesion at the origin of the septal prior to the previous stent 40-50%/  normal LVF, ef 55%  . CARDIOVASCULAR STRESS TEST  10-23-2012  dr Irish Lack   normal nuclear perfusion study w/ no ischemia/  normal LV function and wall motion , ef 65%  . CARPAL TUNNEL RELEASE Bilateral   . CATARACT EXTRACTION W/ INTRAOCULAR LENS  IMPLANT, BILATERAL    . CHOLECYSTECTOMY N/A 11/02/2015   Procedure: LAPAROSCOPIC CHOLECYSTECTOMY WITH INTRAOPERATIVE CHOLANGIOGRAM;  Surgeon: Donnie Mesa, MD;  Location: [facility];  Service: General;  Laterality: N/A;  .  COLONOSCOPY    . CORONARY ANGIOPLASTY WITH STENT PLACEMENT  02/2008   stenting to mid LAD  . GOLD SEED IMPLANT N/A 11/15/2016   Procedure: GOLD SEED IMPLANT TIMES THREE;  Surgeon: Ardis Hughs, MD;  Location: [facility];  Service: Urology;  Laterality: N/A;  . LEFT HEART CATH AND CORONARY ANGIOGRAPHY N/A 05/25/2017   Procedure: LEFT HEART CATH AND CORONARY ANGIOGRAPHY;  Surgeon: Jettie Booze, MD;  Location: [facility];  Service: Cardiovascular;  Laterality: N/A;  . LEFT HEART CATHETERIZATION WITH CORONARY ANGIOGRAM N/A 04/03/2013   Procedure: LEFT HEART CATHETERIZATION WITH CORONARY ANGIOGRAM;  Surgeon: Jettie Booze, MD;  Location: [facility] C[facility];  Service: Cardiovascular;  Laterality: N/A;  patent mLAD stent  w/ mild disease in remainder LAD  and its branches;  mod. focal lesion midLCFx- FFR of lesion was negative for ischemia/  normal LVSF, ef 50%  . lungs  2005   "fluid pumped off lungs"  . NEUROPLASTY / TRANSPOSITION ULNAR NERVE AT ELBOW Right 2004  . PROSTATE BIOPSY N/A 08/31/2016   Procedure: PROSTATE  BIOPSY TRANSRECTAL ULTRASONIC PROSTATE (TUBP);  Surgeon: Ardis Hughs, MD;  Location: [facility];  Service: Urology;  Laterality: N/A;  . SPACE OAR INSTILLATION N/A 11/15/2016   Procedure: SPACE OAR INSTILLATION;  Surgeon: Ardis Hughs, MD;  Location: [facility];  Service: Urology;  Laterality: N/A;  . TRANSTHORACIC ECHOCARDIOGRAM  04/27/2016   severe focal basal LVH, ef 60-65%,  grade 2 diastoilc dysfunction/  mild AR, MR, and TR/  atrial septum lipomatous hypertrophy/  PASP 29mmHg  . UMBILICAL HERNIA REPAIR      Vitals:   09/03/17 1017 09/03/17 1024  BP: 122/60 125/64  Pulse: 64 65   122/60 - Rt arm                                2nd reading - BP taken in Lt arm  Subjective Assessment - 09/03/17 1122    Subjective  Pt reports moderate vertigo at this time - states it is about the same as it was last week; states he does [employer] class on Mon, Wed, Friday and chair yoga on Tues and Thurs.     Pertinent History  h/o CVA's - 2002 and 2003; h/o TIA's; vertigo due to cerebrovascular disease and as late effect of CVA (per referral); lumbar DDD (radiculopathy?) pt reports LBP with standing; Lt heart catherization 05-25-17; DOE    Patient Stated Goals  "Try to minimize the situation - help with balance and vertigo"    Currently in Pain?  Yes    Pain Score  8     Pain Location  --   left side of body   Pain Orientation  Left    Pain Descriptors / Indicators  Aching;Burning;Pins and needles;Stabbing    Pain Onset  More than a month ago    Pain Frequency  Constant    Aggravating Factors   no specific     Pain Relieving Factors  injections (epidurals?)                        [facility] Adult PT Treatment/Exercise - 09/03/17 1031      Transfers   Transfers  Sit to Stand    Number of Reps  Other reps (comment)   5   Comments  no UE support used -  feet on blue Airex      Ambulation/Gait   Ambulation/Gait  Yes    Ambulation/Gait Assistance  6: Modified independent (Device/Increase time)    Ambulation Distance (Feet)  242 Feet   in 3" walk test   Assistive device  None    Gait Pattern  Within Functional Limits    Ambulation Surface  Level;Indoor    Gait Comments  pt reported some increased dizziness upon completion of 3" walk test      Self-Care   Self-Care  Other Self-Care Comments   Discussed benefits of aquatic therapy -plan to add to rx pla      step ups - no UE support used - 10 reps RLE leading, 10 reps LLE leading - onto 6" step   Balance Exercises - 09/03/17 1127      Balance Exercises: Standing   Standing Eyes Opened  Narrow base of support (BOS);Wide (BOA);Head turns;Foam/compliant surface;5 reps    Standing Eyes Closed  Narrow base of support (BOS);Wide (BOA);Head turns;Foam/compliant surface;5 reps    Tandem Stance  Eyes open;2 reps;30 secs    SLS  Eyes open;1 rep;10 secs   each leg   Sidestepping  1 rep    Other Standing Exercises  Pt performed forward, back and side kicks 10 reps each leg alternating :  marching in place, added horizontal and vertical head turns 5 reps each; crossovers and then stepping behind along counter top ; tandem and SLS         PT Education - 09/03/17 1130    Education Details  HEP - exercises along counter top: standing on pillows in corner with feet apart - EO and EC with head turns    Person(s) Educated  Patient    Methods  Explanation;Demonstration;Handout    Comprehension  Verbalized understanding;Returned demonstration          PT Long Term Goals - 08/29/17 2125      PT LONG TERM GOAL #1   Title  Improve TUG score from 15.81 secs to </= 13.5 secs without device  to decr. fall risk.    Baseline  15.81 secs on 08-28-17    Time  4    Period  Weeks    Status  New    Target Date  09/28/17      PT LONG TERM GOAL #2   Title  Increase gait velocity to >/= 2.7 ft/sec without device for incr. gait efficiency.      Baseline  2.42 ft/sec = 13.57 secs    Time  4    Period  Weeks    Status  New    Target Date  09/28/17      PT LONG TERM GOAL #3   Title  Increase distance in 3" walk test by at least 75' to demo improved endurance.    Baseline  3" walk test to be completed     Time  4    Status  New    Target Date  09/28/17      PT LONG TERM GOAL #4   Title  Independent in HEP for balance exs.    Time  4    Period  Weeks    Status  New    Target Date  09/28/17            Plan - 09/03/17 1133    Clinical Impression Statement  Pt demonstrates decreased vestibular input in maintaining balance as evidenced by increased sway with standing on compliant  surface with EC with pt reporting increased dizziness with this activity , and especially with addition of head turns to standing on compliant surface.  Basic balance skills are good, with pt able to perform SLS on each leg today > 10 secs.  Standing balance exercises do aggravate back pain per pt's report.  Pt scheduled for injection this pm.                                                                                                                              Rehab Potential  Good    PT Frequency  2x / week    PT Duration  4 weeks    PT Treatment/Interventions  ADLs/Self Care Home Management;DME Instruction;Stair training;Gait training;Therapeutic activities;Therapeutic exercise;Balance training;Neuromuscular re-education;Patient/family education;Vestibular    PT Next Visit Plan  do SOT (pt states this test was done in the past) - compare results:  check balance exercises given on 09-03-17; cont/balance/ vestibular exercises    PT Home Exercise Plan  see above    Consulted and Agree with Plan of  Care  Patient       Patient will benefit from skilled therapeutic intervention in order to improve the following deficits and impairments:  Cardiopulmonary status limiting activity, Decreased activity tolerance, Decreased balance, Dizziness, Pain, Abnormal gait, Decreased strength, Decreased endurance  Visit Diagnosis: Other abnormalities of gait and mobility  Dizziness and giddiness  Unsteadiness on feet     Problem List Patient Active Problem List   Diagnosis Date Noted  . Radiation therapy complication 59/56/3875  . Primary prostate cancer ([place]) 07/31/2017  . Anemia 07/31/2017  . Vertigo due to cerebrovascular disease 07/31/2017  . Poor compliance with CPAP treatment 07/31/2017  . Absolute anemia 05/16/2017  . Avitaminosis D 05/16/2017  . Benign essential HTN 05/16/2017  . Benign prostatic hypertrophy without urinary obstruction 05/16/2017  . Clinical depression 05/16/2017  . CN (constipation) 05/16/2017  . Current drug use 05/16/2017  . Diabetic neuropathy ([place]) 05/16/2017  . Genital herpes 05/16/2017  . Infarction of lung due to iatrogenic pulmonary embolism ([place]) 05/16/2017  . Peripheral neuralgia 05/16/2017  . Arteriosclerosis of coronary artery 05/16/2017  . Artery disease, cerebral 05/16/2017  . Apnea, sleep 05/16/2017  . Arthralgia of hip or thigh 05/16/2017  . Malignant neoplasm of prostate ([place]) 09/28/2016  . Vascular dementia in remission 09/28/2016  . Remote history of stroke 09/28/2016  . Acute kidney injury ([place]) 05/18/2016  . Dehydration 05/18/2016  . Near syncope 05/18/2016  . Orthostatic hypotension 05/18/2016  . CKD (chronic kidney disease), stage II 04/26/2016  . UTI (urinary tract infection) 04/26/2016  . Encounter for counseling on use of CPAP 11/18/2015  . Chronic cholecystitis with calculus 11/02/2015  . 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 TIA (transient ischemic attack) 11/03/2014  . OSA on CPAP 05/18/2014  . White matter disease  08/14/2013  . OSA (obstructive sleep apnea) 08/14/2013  . Abnormal x-ray of temporomandibular joint 05/30/2013  .  Chronic infection of sinus 05/30/2013  . Cough 05/30/2013  . Fatigue 05/30/2013  . Unsteady gait 05/19/2013  . Combined fat and carbohydrate induced hyperlipemia 04/24/2013  . Syncope 03/20/2013  . Angina pectoris ([place]) 10/08/2012  . Hypertension   . Stroke ([place])   . History of TIAs   . Lumbago   . Other and unspecified hyperlipidemia   . Personal history of unspecified circulatory disease   . Unspecified fall   . Pain in joint, multiple sites   . Degeneration of cervical intervertebral disc   . Unspecified cardiovascular disease   . History of pulmonary embolism: June 2014,  Takes Xarelto 07/01/2012    Class: History of  . Hemiparesis ([place]) 07/18/2011  . Diabetes mellitus type 2 with complications ([place]) 41/28/2081  . HTN (hypertension) 07/18/2011  . CAD in native artery 07/18/2011  . History of recurrent TIAs 07/18/2011    Alda Lea, PT 09/03/2017, 11:39 AM  [facility] [address] [place], [place], [address] Phone: [phone]   Fax:  [phone]  Name: Steve Brown MRN: [id number] Date of Birth: 01-Mar-1940

## 2017-09-04 ENCOUNTER — Encounter: Payer: Self-pay | Admitting: Physical Therapy

## 2017-09-04 ENCOUNTER — Ambulatory Visit: Payer: Medicare Other | Admitting: Physical Therapy

## 2017-09-04 DIAGNOSIS — R2681 Unsteadiness on feet: Secondary | ICD-10-CM

## 2017-09-04 DIAGNOSIS — R2689 Other abnormalities of gait and mobility: Secondary | ICD-10-CM | POA: Diagnosis not present

## 2017-09-04 DIAGNOSIS — R42 Dizziness and giddiness: Secondary | ICD-10-CM | POA: Diagnosis not present

## 2017-09-04 NOTE — Therapy (Signed)
[facility] [address] [place], [place], [address] Phone: [phone]   Fax:  [phone]  Physical Therapy Treatment  Patient Details  Name: Steve Brown MRN: [id number] Date of Birth: 1940-11-25 Referring Provider: Asencion Partridge Dohmeier   Encounter Date: 09/04/2017  PT End of Session - 09/04/17 1901    Visit Number  3    Number of Visits  9    Date for PT Re-Evaluation  09/28/17    Authorization Type  Medicare and [facility]    Authorization Time Period  08-28-17 - 11-26-17    PT Start Time  1315    PT Stop Time  1403    PT Time Calculation (min)  48 min    Activity Tolerance  Patient tolerated treatment well       Past Medical History:  Diagnosis Date  . Abnormal prostate biopsy   . Anticoagulant long-term use    currently xarelto  . BPH with elevated PSA   . CKD (chronic kidney disease), stage II   . Complication of anesthesia    limted neck rom limited use of left arm due to cva  . Coronary artery disease    CARDIOLOGIST-  DR Irish Lack--  2010-- PCI w/ stenting midLAD  . DDD (degenerative disc disease), lumbar   . Degeneration of cervical intervertebral disc   . Depression   . Dyspnea on exertion   . GERD (gastroesophageal reflux disease)   . Hemiparesis due to cerebral infarction   . History of cerebrovascular accident (CVA) with residual deficit 2002 and 2003--  hemiparisis both sides   per MRI  anterior left frontal lobe, left para midline pons, and inferior cerebullam bilaterally infarcts  . History of pulmonary embolus (PE)    06-30-2012  extensive bilaterally  . History of recurrent TIAs   . History of syncope    hx multiple pre-syncope and syncopal episodes due to vasovagal, orthostatic hypotension, dehydration  . History of TIAs    several since 2002  . Hyperlipidemia   . Hypertension   . Mild atherosclerosis of carotid artery, bilateral    per last duplex 11-04-2014  bilateral ICA 1--39%  .  Neuropathy    fingers  . OSA on CPAP    followed by dr dohmeier--  sev. osa w/ AHI 65.9  . Prostate cancer ([place]) dx 2018  . S/P coronary artery stent placement 2010   stenting to mid LAD  . Simple renal cyst    bilaterally  . Trigger finger of both hands 11-17-13  . Type 2 diabetes mellitus ([place]) dx 1986   last one A1c 9.2 on 04-26-2016  . Unsteady gait    . Hx prior CVA/TIAs;  . Vertebral artery occlusion, left    chronic    Past Surgical History:  Procedure Laterality Date  . ANTERIOR CERVICAL DECOMP/DISCECTOMY FUSION  2004   C3 -- C6 limited rom  . CARDIAC CATHETERIZATION  06-10-2010   dr Irish Lack   wide patent LAD stent, mid lesion at the origin of the septal prior to the previous stent 40-50%/  normal LVF, ef 55%  . CARDIOVASCULAR STRESS TEST  10-23-2012  dr Irish Lack   normal nuclear perfusion study w/ no ischemia/  normal LV function and wall motion , ef 65%  . CARPAL TUNNEL RELEASE Bilateral   . CATARACT EXTRACTION W/ INTRAOCULAR LENS  IMPLANT, BILATERAL    . CHOLECYSTECTOMY N/A 11/02/2015   Procedure: LAPAROSCOPIC CHOLECYSTECTOMY WITH INTRAOPERATIVE CHOLANGIOGRAM;  Surgeon: Donnie Mesa, MD;  Location:  [facility] OR;  Service: General;  Laterality: N/A;  . COLONOSCOPY    . CORONARY ANGIOPLASTY WITH STENT PLACEMENT  02/2008   stenting to mid LAD  . GOLD SEED IMPLANT N/A 11/15/2016   Procedure: GOLD SEED IMPLANT TIMES THREE;  Surgeon: Ardis Hughs, MD;  Location: [facility];  Service: Urology;  Laterality: N/A;  . LEFT HEART CATH AND CORONARY ANGIOGRAPHY N/A 05/25/2017   Procedure: LEFT HEART CATH AND CORONARY ANGIOGRAPHY;  Surgeon: Jettie Booze, MD;  Location: [facility];  Service: Cardiovascular;  Laterality: N/A;  . LEFT HEART CATHETERIZATION WITH CORONARY ANGIOGRAM N/A 04/03/2013   Procedure: LEFT HEART CATHETERIZATION WITH CORONARY ANGIOGRAM;  Surgeon: Jettie Booze, MD;  Location: [facility] CATH LAB;  Service: Cardiovascular;  Laterality:  N/A;  patent mLAD stent  w/ mild disease in remainder LAD and its branches;  mod. focal lesion midLCFx- FFR of lesion was negative for ischemia/  normal LVSF, ef 50%  . lungs  2005   "fluid pumped off lungs"  . NEUROPLASTY / TRANSPOSITION ULNAR NERVE AT ELBOW Right 2004  . PROSTATE BIOPSY N/A 08/31/2016   Procedure: PROSTATE  BIOPSY TRANSRECTAL ULTRASONIC PROSTATE (TUBP);  Surgeon: Ardis Hughs, MD;  Location: [facility];  Service: Urology;  Laterality: N/A;  . SPACE OAR INSTILLATION N/A 11/15/2016   Procedure: SPACE OAR INSTILLATION;  Surgeon: Ardis Hughs, MD;  Location: [facility];  Service: Urology;  Laterality: N/A;  . TRANSTHORACIC ECHOCARDIOGRAM  04/27/2016   severe focal basal LVH, ef 60-65%,  grade 2 diastoilc dysfunction/  mild AR, MR, and TR/  atrial septum lipomatous hypertrophy/  PASP 8mmHg  . UMBILICAL HERNIA REPAIR      There were no vitals filed for this visit.  Subjective Assessment - 09/04/17 1314    Subjective  Took meclizine this morning ~9:30 and then went to the gym and walked 1 mi plus 3 laps. Tends to take it ~2x/wk (ex. if he knows he's going to drive a long way, if he knows he has to stand at front of church to pray or usher). Reports he gets anxious at church thinking about having to walk up to the front with everyone watching because he is afraid he'll get dizzy and lose his balance. Also [facility] if he stops to talk to someone he'll often feel himself rocking and is afraid the person can see him rocking.     Pertinent History  h/o CVA's - 2002 and 2003; h/o TIA's; vertigo due to cerebrovascular disease and as late effect of CVA (per referral); lumbar DDD (radiculopathy?) pt reports LBP with standing; Lt heart catherization 05-25-17; DOE    Patient Stated Goals  "Try to minimize the situation - help with balance and vertigo"    Currently in Pain?  Yes    Pain Score  8     Pain Location  Arm     Pain Orientation  Left    Pain Descriptors / Indicators  Aching;Burning;Stabbing;Pins and needles    Pain Type  Neuropathic pain;Chronic pain    Pain Radiating Towards  left occiput down left arm    Pain Onset  More than a month ago    Pain Frequency  Constant    Aggravating Factors   nothing specific          Treatment-  Completed SOT: Sensory Organization Testing=70% compared to age/height normative value of 62%.  Patient demonstrated scores of:   97 for use of somatosensory feedback for balance (compared to age/height normative value of 80),  78 for use of visual feedback for balance (compared to age/height normative value of 74),   58 for use of vestibular feedback for balance (compared to age/height normative value of 50).   COG readings were WNL, but consistently slightly posterior of center (which was also the finding in 2015).         Analysis of hip vs ankle strategy also all WNL  Compared these results to his SOT completed 06/2013 with patient showing improved overall composite score (70 vs 67) and vestibular system score (58 vs 43).      See also pt education section for education completed.                       PT Education - 09/04/17 1858    Education Details  role anxiety can play in increasing dizziness; purpose of carrying a cane for incr somatosensory input to help with balance and dizziness by giving brain an additional point of reference; results of SOT and compared to 4 years ago    Person(s) Educated  Patient    Methods  Explanation;Handout    Comprehension  Verbalized understanding;Need further instruction          PT Long Term Goals - 08/29/17 2125      PT LONG TERM GOAL #1   Title  Improve TUG score from 15.81 secs to </= 13.5 secs without device to decr. fall risk.    Baseline  15.81 secs on 08-28-17    Time  4    Period  Weeks    Status  New    Target Date  09/28/17      PT LONG TERM GOAL #2   Title  Increase gait  velocity to >/= 2.7 ft/sec without device for incr. gait efficiency.      Baseline  2.42 ft/sec = 13.57 secs    Time  4    Period  Weeks    Status  New    Target Date  09/28/17      PT LONG TERM GOAL #3   Title  Increase distance in 3" walk test by at least 75' to demo improved endurance.    Baseline  3" walk test to be completed     Time  4    Status  New    Target Date  09/28/17      PT LONG TERM GOAL #4   Title  Independent in HEP for balance exs.    Time  4    Period  Weeks    Status  New    Target Date  09/28/17            Plan - 09/04/17 1905    Clinical Impression Statement  Session focused on completing Sensory Organization Testing via Risk analyst and pt education. In comparison to the SOT pt did in 06/2013, patient's composite score has improved and all 3 sensory systems were WNL (somatosensory, vision, vestibular). Patient noted he did take meclizine this morning. Patient reported increased dizziness and sense of imbalance as he stepped off the machine and reaching out for UE support to walk 4 feet to chair. Noted during education re: SOT results, pt's eyes were frequently dysconjugate, however pt denied visual blurring or diplopia. Also noted MRI brain 07/23/17 showed acute 4 mm RIGHT occipital periventricular infarct. Will plan further oculomotor  testing next visit with ?addition of eye exercises.     Rehab Potential  Good    PT Frequency  2x / week    PT Duration  4 weeks    PT Treatment/Interventions  ADLs/Self Care Home Management;DME Instruction;Stair training;Gait training;Therapeutic activities;Therapeutic exercise;Balance training;Neuromuscular re-education;Patient/family education;Vestibular    PT Next Visit Plan  do oculomotor testing (07/23/17 MRI showed acute rt occipital infarct--pt noted to have dysconjugate gaze at end of session 8/27); add eye exercises if approp; check balance exercises given on 09-03-17; cont/balance/ vestibular exercises    PT  Home Exercise Plan  see above    Consulted and Agree with Plan of Care  Patient       Patient will benefit from skilled therapeutic intervention in order to improve the following deficits and impairments:  Cardiopulmonary status limiting activity, Decreased activity tolerance, Decreased balance, Dizziness, Pain, Abnormal gait, Decreased strength, Decreased endurance  Visit Diagnosis: Dizziness and giddiness  Unsteadiness on feet     Problem List Patient Active Problem List   Diagnosis Date Noted  . Radiation therapy complication 42/70/6237  . Primary prostate cancer ([place]) 07/31/2017  . Anemia 07/31/2017  . Vertigo due to cerebrovascular disease 07/31/2017  . Poor compliance with CPAP treatment 07/31/2017  . Absolute anemia 05/16/2017  . Avitaminosis D 05/16/2017  . Benign essential HTN 05/16/2017  . Benign prostatic hypertrophy without urinary obstruction 05/16/2017  . Clinical depression 05/16/2017  . CN (constipation) 05/16/2017  . Current drug use 05/16/2017  . Diabetic neuropathy ([place]) 05/16/2017  . Genital herpes 05/16/2017  . Infarction of lung due to iatrogenic pulmonary embolism ([place]) 05/16/2017  . Peripheral neuralgia 05/16/2017  . Arteriosclerosis of coronary artery 05/16/2017  . Artery disease, cerebral 05/16/2017  . Apnea, sleep 05/16/2017  . Arthralgia of hip or thigh 05/16/2017  . Malignant neoplasm of prostate ([place]) 09/28/2016  . Vascular dementia in remission 09/28/2016  . Remote history of stroke 09/28/2016  . Acute kidney injury ([place]) 05/18/2016  . Dehydration 05/18/2016  . Near syncope 05/18/2016  . Orthostatic hypotension 05/18/2016  . CKD (chronic kidney disease), stage II 04/26/2016  . UTI (urinary tract infection) 04/26/2016  . Encounter for counseling on use of CPAP 11/18/2015  . Chronic cholecystitis with calculus 11/02/2015  . 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 TIA (transient ischemic attack) 11/03/2014  . OSA on CPAP 05/18/2014   . White matter disease 08/14/2013  . OSA (obstructive sleep apnea) 08/14/2013  . Abnormal x-ray of temporomandibular joint 05/30/2013  . Chronic infection of sinus 05/30/2013  . Cough 05/30/2013  . Fatigue 05/30/2013  . Unsteady gait 05/19/2013  . Combined fat and carbohydrate induced hyperlipemia 04/24/2013  . Syncope 03/20/2013  . Angina pectoris ([place]) 10/08/2012  . Hypertension   . Stroke ([place])   . History of TIAs   . Lumbago   . Other and unspecified hyperlipidemia   . Personal history of unspecified circulatory disease   . Unspecified fall   . Pain in joint, multiple sites   . Degeneration of cervical intervertebral disc   . Unspecified cardiovascular disease   . History of pulmonary embolism: June 2014,  Takes Xarelto 07/01/2012    Class: History of  . Hemiparesis ([place]) 07/18/2011  . Diabetes mellitus type 2 with complications ([place]) 62/83/1517  . HTN (hypertension) 07/18/2011  . CAD in native artery 07/18/2011  . History of recurrent TIAs 07/18/2011    Rexanne Mano, PT 09/04/2017, 7:28 PM  [facility] [address]  [place], [place], [address] Phone: [phone]   Fax:  [phone]  Name: Steve Brown MRN: [id number] Date of Birth: November 06, 1940

## 2017-09-09 ENCOUNTER — Emergency Department (HOSPITAL_COMMUNITY): Payer: Medicare Other

## 2017-09-09 ENCOUNTER — Emergency Department (HOSPITAL_BASED_OUTPATIENT_CLINIC_OR_DEPARTMENT_OTHER)
Admission: EM | Admit: 2017-09-09 | Discharge: 2017-09-10 | Disposition: A | Discharge status: Home / Self Care | Payer: Medicare Other | Attending: Emergency Medicine | Admitting: Emergency Medicine

## 2017-09-09 ENCOUNTER — Other Ambulatory Visit: Payer: Self-pay

## 2017-09-09 ENCOUNTER — Encounter (HOSPITAL_BASED_OUTPATIENT_CLINIC_OR_DEPARTMENT_OTHER): Payer: Self-pay | Admitting: Emergency Medicine

## 2017-09-09 ENCOUNTER — Emergency Department (HOSPITAL_BASED_OUTPATIENT_CLINIC_OR_DEPARTMENT_OTHER): Payer: Medicare Other

## 2017-09-09 DIAGNOSIS — M542 Cervicalgia: Secondary | ICD-10-CM

## 2017-09-09 DIAGNOSIS — I2510 Atherosclerotic heart disease of native coronary artery without angina pectoris: Secondary | ICD-10-CM | POA: Insufficient documentation

## 2017-09-09 DIAGNOSIS — H538 Other visual disturbances: Secondary | ICD-10-CM | POA: Diagnosis not present

## 2017-09-09 DIAGNOSIS — Z7901 Long term (current) use of anticoagulants: Secondary | ICD-10-CM | POA: Diagnosis not present

## 2017-09-09 DIAGNOSIS — Z8673 Personal history of transient ischemic attack (TIA), and cerebral infarction without residual deficits: Secondary | ICD-10-CM | POA: Insufficient documentation

## 2017-09-09 DIAGNOSIS — E1122 Type 2 diabetes mellitus with diabetic chronic kidney disease: Secondary | ICD-10-CM | POA: Diagnosis not present

## 2017-09-09 DIAGNOSIS — Z794 Long term (current) use of insulin: Secondary | ICD-10-CM | POA: Insufficient documentation

## 2017-09-09 DIAGNOSIS — R42 Dizziness and giddiness: Secondary | ICD-10-CM | POA: Diagnosis not present

## 2017-09-09 DIAGNOSIS — I129 Hypertensive chronic kidney disease with stage 1 through stage 4 chronic kidney disease, or unspecified chronic kidney disease: Secondary | ICD-10-CM | POA: Insufficient documentation

## 2017-09-09 DIAGNOSIS — Z79899 Other long term (current) drug therapy: Secondary | ICD-10-CM | POA: Diagnosis not present

## 2017-09-09 DIAGNOSIS — N182 Chronic kidney disease, stage 2 (mild): Secondary | ICD-10-CM | POA: Diagnosis not present

## 2017-09-09 HISTORY — DX: Disorder of kidney and ureter, unspecified: N28.9

## 2017-09-09 LAB — BASIC METABOLIC PANEL
Anion gap: 9 (ref 5–15)
BUN: 36 mg/dL — ABNORMAL HIGH (ref 8–23)
CO2: 28 mmol/L (ref 22–32)
Calcium: 9.1 mg/dL (ref 8.9–10.3)
Chloride: 100 mmol/L (ref 98–111)
Creatinine, Ser: 1.44 mg/dL — ABNORMAL HIGH (ref 0.61–1.24)
GFR calc Af Amer: 53 mL/min — ABNORMAL LOW (ref >60)
GFR calc non Af Amer: 46 mL/min — ABNORMAL LOW (ref >60)
Glucose, Bld: 212 mg/dL — ABNORMAL HIGH (ref 70–99)
Potassium: 4.7 mmol/L (ref 3.5–5.1)
Sodium: 137 mmol/L (ref 135–145)

## 2017-09-09 LAB — CBC
HCT: 39.5 % (ref 39.0–52.0)
Hemoglobin: 14 g/dL (ref 13.0–17.0)
MCH: 32.7 pg (ref 26.0–34.0)
MCHC: 35.4 g/dL (ref 30.0–36.0)
MCV: 92.3 fL (ref 78.0–100.0)
Platelets: 200 10*3/uL (ref 150–400)
RBC: 4.28 MIL/uL (ref 4.22–5.81)
RDW: 12.6 % (ref 11.5–15.5)
WBC: 7.4 10*3/uL (ref 4.0–10.5)

## 2017-09-09 LAB — URINALYSIS, ROUTINE W REFLEX MICROSCOPIC
Bilirubin Urine: NEGATIVE
Glucose, UA: NEGATIVE mg/dL
Ketones, ur: NEGATIVE mg/dL
Leukocytes, UA: NEGATIVE
Nitrite: NEGATIVE
Protein, ur: NEGATIVE mg/dL
Specific Gravity, Urine: <1.005 — ABNORMAL LOW (ref 1.005–1.030)
pH: 5.5 (ref 5.0–8.0)

## 2017-09-09 LAB — URINALYSIS, MICROSCOPIC (REFLEX)

## 2017-09-09 LAB — CBG MONITORING, ED
Glucose-Capillary: 207 mg/dL — ABNORMAL HIGH (ref 70–99)
Glucose-Capillary: 141 mg/dL — ABNORMAL HIGH (ref 70–99)

## 2017-09-09 LAB — TROPONIN I: Troponin I: <0.03 ng/mL (ref <0.03)

## 2017-09-09 MED ORDER — LORAZEPAM 2 MG/ML IJ SOLN
1.0000 mg | Freq: Once | INTRAMUSCULAR | Status: AC
  Administered 2017-09-09: 1 mg via INTRAVENOUS
  Filled 2017-09-09: qty 1

## 2017-09-09 MED ORDER — IOPAMIDOL (ISOVUE-370) INJECTION 76%
100.0000 mL | Freq: Once | INTRAVENOUS | Status: AC | PRN
  Administered 2017-09-09: 100 mL via INTRAVENOUS

## 2017-09-09 NOTE — ED Provider Notes (Signed)
[facility] DEPARTMENT Provider Note   CSN: [id number] Arrival date & time: 09/09/17  1433     History   Chief Complaint Chief Complaint  Patient presents with  . Dizziness    HPI Steve Brown is a 77 y.o. male.  The history is provided by the patient and medical records. No language interpreter was used.  Neurologic Problem  This is a recurrent problem. The current episode started more than 2 days ago. The problem occurs constantly. The problem has been gradually improving. Associated symptoms include headaches (resolved HA). Pertinent negatives include no chest pain, no abdominal pain and no shortness of breath. Nothing aggravates the symptoms. Nothing relieves the symptoms. He has tried nothing for the symptoms. The treatment provided no relief.    Past Medical History:  Diagnosis Date  . Abnormal prostate biopsy   . Anticoagulant long-term use    currently xarelto  . BPH with elevated PSA   . CKD (chronic kidney disease), stage II   . Complication of anesthesia    limted neck rom limited use of left arm due to cva  . Coronary artery disease    CARDIOLOGIST-  DR Irish Lack--  2010-- PCI w/ stenting midLAD  . DDD (degenerative disc disease), lumbar   . Degeneration of cervical intervertebral disc   . Depression   . Dyspnea on exertion   . GERD (gastroesophageal reflux disease)   . Hemiparesis due to cerebral infarction   . History of cerebrovascular accident (CVA) with residual deficit 2002 and 2003--  hemiparisis both sides   per MRI  anterior left frontal lobe, left para midline pons, and inferior cerebullam bilaterally infarcts  . History of pulmonary embolus (PE)    06-30-2012  extensive bilaterally  . History of recurrent TIAs   . History of syncope    hx multiple pre-syncope and syncopal episodes due to vasovagal, orthostatic hypotension, dehydration  . History of TIAs    several since 2002  . Hyperlipidemia   . Hypertension   . Mild  atherosclerosis of carotid artery, bilateral    per last duplex 11-04-2014  bilateral ICA 1--39%  . Neuropathy    fingers  . OSA on CPAP    followed by dr dohmeier--  sev. osa w/ AHI 65.9  . Prostate cancer ([place]) dx 2018  . S/P coronary artery stent placement 2010   stenting to mid LAD  . Simple renal cyst    bilaterally  . Trigger finger of both hands 11-17-13  . Type 2 diabetes mellitus ([place]) dx 1986   last one A1c 9.2 on 04-26-2016  . Unsteady gait    . Hx prior CVA/TIAs;  . Vertebral artery occlusion, left    chronic    Patient Active Problem List   Diagnosis Date Noted  . Radiation therapy complication 40/08/6759  . Primary prostate cancer ([place]) 07/31/2017  . Anemia 07/31/2017  . Vertigo due to cerebrovascular disease 07/31/2017  . Poor compliance with CPAP treatment 07/31/2017  . Absolute anemia 05/16/2017  . Avitaminosis D 05/16/2017  . Benign essential HTN 05/16/2017  . Benign prostatic hypertrophy without urinary obstruction 05/16/2017  . Clinical depression 05/16/2017  . CN (constipation) 05/16/2017  . Current drug use 05/16/2017  . Diabetic neuropathy ([place]) 05/16/2017  . Genital herpes 05/16/2017  . Infarction of lung due to iatrogenic pulmonary embolism ([place]) 05/16/2017  . Peripheral neuralgia 05/16/2017  . Arteriosclerosis of coronary artery 05/16/2017  . Artery disease, cerebral 05/16/2017  . Apnea, sleep 05/16/2017  .  Arthralgia of hip or thigh 05/16/2017  . Malignant neoplasm of prostate ([place]) 09/28/2016  . Vascular dementia in remission 09/28/2016  . Remote history of stroke 09/28/2016  . Acute kidney injury ([place]) 05/18/2016  . Dehydration 05/18/2016  . Near syncope 05/18/2016  . Orthostatic hypotension 05/18/2016  . CKD (chronic kidney disease), stage II 04/26/2016  . UTI (urinary tract infection) 04/26/2016  . Encounter for counseling on use of CPAP 11/18/2015  . Chronic cholecystitis with calculus 11/02/2015  . Stroke, 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[place])  04/22/2015  . TIA (transient ischemic attack) 11/03/2014  . OSA on CPAP 05/18/2014  . White matter disease 08/14/2013  . OSA (obstructive sleep apnea) 08/14/2013  . Abnormal x-ray of temporomandibular joint 05/30/2013  . Chronic infection of sinus 05/30/2013  . Cough 05/30/2013  . Fatigue 05/30/2013  . Unsteady gait 05/19/2013  . Combined fat and carbohydrate induced hyperlipemia 04/24/2013  . Syncope 03/20/2013  . Angina pectoris ([place]) 10/08/2012  . Hypertension   . Stroke ([place])   . History of TIAs   . Lumbago   . Other and unspecified hyperlipidemia   . Personal history of unspecified circulatory disease   . Unspecified fall   . Pain in joint, multiple sites   . Degeneration of cervical intervertebral disc   . Unspecified cardiovascular disease   . History of pulmonary embolism: June 2014,  Takes Xarelto 07/01/2012    Class: History of  . Hemiparesis ([place]) 07/18/2011  . Diabetes mellitus type 2 with complications ([place]) 38/25/0539  . HTN (hypertension) 07/18/2011  . CAD in native artery 07/18/2011  . History of recurrent TIAs 07/18/2011    Past Surgical History:  Procedure Laterality Date  . ANTERIOR CERVICAL DECOMP/DISCECTOMY FUSION  2004   C3 -- C6 limited rom  . CARDIAC CATHETERIZATION  06-10-2010   dr Irish Lack   wide patent LAD stent, mid lesion at the origin of the septal prior to the previous stent 40-50%/  normal LVF, ef 55%  . CARDIOVASCULAR STRESS TEST  10-23-2012  dr Irish Lack   normal nuclear perfusion study w/ no ischemia/  normal LV function and wall motion , ef 65%  . CARPAL TUNNEL RELEASE Bilateral   . CATARACT EXTRACTION W/ INTRAOCULAR LENS  IMPLANT, BILATERAL    . CHOLECYSTECTOMY N/A 11/02/2015   Procedure: LAPAROSCOPIC CHOLECYSTECTOMY WITH INTRAOPERATIVE CHOLANGIOGRAM;  Surgeon: Donnie Mesa, MD;  Location: [facility];  Service: General;  Laterality: N/A;  . COLONOSCOPY    . CORONARY ANGIOPLASTY WITH STENT PLACEMENT  02/2008   stenting to mid LAD  . GOLD  SEED IMPLANT N/A 11/15/2016   Procedure: GOLD SEED IMPLANT TIMES THREE;  Surgeon: Ardis Hughs, MD;  Location[facility];  Service: Urology;  Laterality: N/A;  . LEFT HEART CATH AND CORONARY ANGIOGRAPHY N/A 05/25/2017   Procedure: LEFT HEART CATH AND CORONARY ANGIOGRAPHY;  Surgeon: Jettie Booze, MD;  Location: [facility] CV LAB;  Service: Cardiovascular;  Laterality: N/A;  . LEFT HEART CATHETERIZATION WITH CORONARY ANGIOGRAM N/A 04/03/2013   Procedure: LEFT HEART CATHETERIZATION WITH CORONARY ANGIOGRAM;  Surgeon: Jettie Booze, MD;  Location: [facility] CATH LAB;  Service: Cardiovascular;  Laterality: N/A;  patent mLAD stent  w/ mild disease in remainder LAD and its branches;  mod. focal lesion midLCFx- FFR of lesion was negative for ischemia/  normal LVSF, ef 50%  . lungs  2005   "fluid pumped off lungs"  . NEUROPLASTY / TRANSPOSITION ULNAR NERVE AT ELBOW Right 2004  . PROSTATE BIOPSY N/A 08/31/2016  Procedure: PROSTATE  BIOPSY TRANSRECTAL ULTRASONIC PROSTATE (TUBP);  Surgeon: Ardis Hughs, MD;  Location: [facility];  Service: Urology;  Laterality: N/A;  . SPACE OAR INSTILLATION N/A 11/15/2016   Procedure: SPACE OAR INSTILLATION;  Surgeon: Ardis Hughs, MD;  Location: [facility];  Service: Urology;  Laterality: N/A;  . TRANSTHORACIC ECHOCARDIOGRAM  04/27/2016   severe focal basal LVH, ef 60-65%,  grade 2 diastoilc dysfunction/  mild AR, MR, and TR/  atrial septum lipomatous hypertrophy/  PASP 39mmHg  . UMBILICAL HERNIA REPAIR          Home Medications    Prior to Admission medications   Medication Sig Start Date End Date Taking? Authorizing Provider  acetaminophen (TYLENOL) 500 MG tablet Take 1,000 mg by mouth every 6 (six) hours as needed.     [provider]  Ascorbic Acid (VITAMIN C PO) Take 1 tablet by mouth daily.    [provider]  BD PEN NEEDLE NANO U/F 32G X 4 MM MISC by Does not apply  route.  09/01/13   [provider]  bisacodyl (DULCOLAX) 5 MG EC tablet Take 5 mg by mouth daily as needed for moderate constipation.    [provider]  cholecalciferol (VITAMIN D) 1000 UNITS tablet Take 1,000 Units by mouth daily.    [provider]  diclofenac sodium (VOLTAREN) 1 % GEL Apply 1 application topically daily as needed (pain).  10/16/16   [provider]  Echinacea 450 MG CAPS Take 1 capsule by mouth daily.    [provider]  Ferrous Gluconate-C-Folic Acid (IRON-C PO) Take 1 tablet by mouth daily.    [provider]  fluticasone (FLONASE) 50 MCG/ACT nasal spray Place 1 spray into both nostrils 2 (two) times daily.  09/26/16   [provider]  furosemide (LASIX) 20 MG tablet Take 20 mg by mouth daily as needed (swelling).     [provider]  HUMALOG MIX 75/25 KWIKPEN (75-25) 100 UNIT/ML Kwikpen Inject 6-10 Units into the skin 3 (three) times daily with meals.  10/21/13   [provider]  Insulin Glargine (TOUJEO SOLOSTAR) 300 UNIT/ML SOPN Inject 25 Units into the skin every morning.    [provider]  MECLIZINE HCL PO Take 2 capsules by mouth daily.    [provider]  metFORMIN (GLUCOPHAGE) 500 MG tablet Take 500 mg by mouth as needed (Diabetes).    [provider]  Multiple Vitamin (MULTI-DAY PO) Take 1 tablet by mouth daily.    [provider]  nitroGLYCERIN (NITROSTAT) 0.4 MG SL tablet Place 1 tablet (0.4 mg total) under the tongue every 5 (five) minutes as needed for chest pain. 12/07/15   Jettie Booze, MD  pantoprazole (PROTONIX) 40 MG tablet Take 40 mg by mouth daily.     [provider]  pregabalin (LYRICA) 300 MG capsule Take 1 capsule (300 mg total) by mouth 2 (two) times daily. 07/26/12   Dohmeier, Asencion Partridge, MD  rivaroxaban (XARELTO) 20 MG TABS tablet Take 1 tablet (20 mg total) by mouth daily. Resume in 72 hours after procedure. 05/26/17    Jettie Booze, MD  rosuvastatin (CRESTOR) 10 MG tablet Take 10 mg by mouth every evening.     [provider]  tamsulosin (FLOMAX) 0.4 MG CAPS capsule Take 0.4 mg by mouth daily. 05/09/16   [provider]  valACYclovir (VALTREX) 500 MG tablet Take 500 mg by mouth daily.    [provider]    Family History Family History  Problem Relation Age of Onset  . Aneurysm Mother   . Cancer Father        unknown either pancreatic or prostate  . Heart attack Neg Hx     Social History Social History   Tobacco Use  . Smoking status: Never Smoker  . Smokeless tobacco: Never Used  Substance Use Topics  . Alcohol use: No    Alcohol/week: 0.0 standard drinks  . Drug use: No     Allergies   Other and Phenergan [promethazine]   Review of Systems Review of Systems  Constitutional: Negative for chills, diaphoresis, fatigue and fever.  HENT: Negative for congestion.   Eyes: Positive for visual disturbance. Negative for photophobia.  Respiratory: Negative for cough, chest tightness, shortness of breath and wheezing.   Cardiovascular: Negative for chest pain.  Gastrointestinal: Negative for abdominal pain, constipation, diarrhea, nausea and vomiting.  Genitourinary: Negative for flank pain and frequency.  Musculoskeletal: Positive for neck pain. Negative for back pain and neck stiffness.  Skin: Negative for rash and wound.  Neurological: Positive for dizziness and headaches (resolved HA). Negative for syncope, facial asymmetry, speech difficulty, weakness, light-headedness and numbness.  Psychiatric/Behavioral: Negative for agitation.  All other systems reviewed and are negative.    Physical Exam Updated Vital Signs BP (!) 115/53 (BP Location: Left Arm)   Pulse 76   Temp 99.1 F (37.3 C) (Oral)   Resp 18   Ht 5\' 5"  (1.651 m)   Wt 84.8 kg   SpO2 98%   BMI 31.12 kg/m   Physical Exam  Constitutional: He is oriented to person, place, and time. He  appears well-developed and well-nourished. No distress.  HENT:  Head: Normocephalic and atraumatic.  Nose: Nose normal.  Mouth/Throat: Oropharynx is clear and moist. No oropharyngeal exudate.  Eyes: Pupils are equal, round, and reactive to light. Conjunctivae and EOM are normal.  Neck: Normal range of motion. Neck supple.  Cardiovascular: Normal rate and regular rhythm.  No murmur heard. Pulmonary/Chest: Effort normal and breath sounds normal. No respiratory distress. He has no wheezes. He has no rales. He exhibits no tenderness.  Abdominal: Soft. There is no tenderness.  Musculoskeletal: He exhibits no edema or tenderness.  Neurological: He is alert and oriented to person, place, and time. He is not disoriented. He displays no tremor. No cranial nerve deficit or sensory deficit. He exhibits normal muscle tone. Coordination and gait normal. GCS eye subscore is 4. GCS verbal subscore is 5. GCS motor subscore is 6.  Patient had unsteadiness on his feet but passed a Romberg test.  Skin: Skin is warm and dry. Capillary refill takes less than 2 seconds. He is not diaphoretic. No erythema. No pallor.  Psychiatric: He has a normal mood and affect.  Nursing note and vitals reviewed.    ED Treatments / Results  Labs (all labs ordered are listed, but only abnormal results are displayed) Labs Reviewed  BASIC METABOLIC PANEL - Abnormal; Notable for the following components:      Result Value   Glucose, Bld 212 (*)    BUN 36 (*)    Creatinine, Ser 1.44 (*)    GFR calc non Af Amer 46 (*)    GFR calc Af Amer 53 (*)    All other components within normal limits  CBG MONITORING, ED - Abnormal; Notable for the following components:   Glucose-Capillary 207 (*)    All other components within normal limits  URINE  CULTURE  CBC  TROPONIN I  URINALYSIS, ROUTINE W REFLEX MICROSCOPIC    EKG EKG Interpretation  Date/Time:  Sunday September 09 2017 14:49:33 EDT Ventricular Rate:  68 PR Interval:      QRS Duration: 86 QT Interval:  370 QTC Calculation: 394 R Axis:   3 Text Interpretation:  Sinus rhythm Atrial premature complexes Abnormal R-wave progression, early transition When compared to prior, no significnat changes seen.  No STEMI Confirmed by Antony Blackbird [phone]) on 09/09/2017 3:49:47 PM   Radiology Ct Angio Head W Or Wo Contrast  Result Date: 09/09/2017 CLINICAL DATA:  Persistent central vertigo EXAM: CT ANGIOGRAPHY HEAD AND NECK TECHNIQUE: Multidetector CT imaging of the head and neck was performed using the standard protocol during bolus administration of intravenous contrast. Multiplanar CT image reconstructions and MIPs were obtained to evaluate the vascular anatomy. Carotid stenosis measurements (when applicable) are obtained utilizing NASCET criteria, using the distal internal carotid diameter as the denominator. CONTRAST:  147mL ISOVUE-370 IOPAMIDOL (ISOVUE-370) INJECTION 76% COMPARISON:  Brain MRI 07/23/2017. brain MRI 07/03/2013 FINDINGS: CT HEAD FINDINGS Brain: No evidence of acute infarction, hemorrhage, hydrocephalus, extra-axial collection or mass lesion/mass effect. Remote small vessel infarct in the left more than right cerebellum. Vascular: Atherosclerotic calcification. Skull: No acute or aggressive finding. Subgaleal lipoma along the left forehead. Sinuses: Chronic right sphenoid sinusitis with inspissated material based on MRI. There is chronic left posterior ethmoid sinusitis as well. Orbits: Bilateral cataract resection. Review of the MIP images confirms the above findings CTA NECK FINDINGS Aortic arch: Atherosclerotic plaque.  Two vessel branching. Right carotid system: Primarily calcified plaque at the common carotid bifurcation with proximal ICA stenosis measuring up to 40% on sagittal reformats. No ulceration, beading, or dissection. Left carotid system: Calcified plaque on the proximal common carotid without flow limiting stenosis. Calcified plaque at the common carotid  bifurcation and ICA bulb without significant stenosis. No beading, ulceration, or dissection. Vertebral arteries: Proximal subclavian atherosclerosis without flow limiting stenosis. The dominant right vertebral artery is smooth and widely patent to the dura. There is chronic nonvisualization of the left vertebral artery until think vessel at V3. The left PICA is patent, likely from retrograde flow. Skeleton: C3-C5 corpectomy with ventral plate. No acute osseous finding. Other neck: No incidental mass or inflammation. Upper chest: Negative Review of the MIP images confirms the above findings CTA HEAD FINDINGS Anterior circulation: Calcification in the carotid siphons without stenosis. No branch occlusion or aneurysm. Posterior circulation: Strong right vertebral artery dominance. Right V4 segment calcified plaque with mild luminal narrowing. The basilar is smooth and widely patent. Robust flow in posterior circulation branches Venous sinuses: Patent Anatomic variants: None significant Delayed phase: No abnormal intracranial enhancement. Review of the MIP images confirms the above findings IMPRESSION: 1. No emergent finding. 2. Left vertebral occlusion with downstream reconstitution, present since at least 2015. Small remote cerebellar infarcts may have been related. 3. Cervical carotid atherosclerosis with up to 40% proximal right ICA stenosis. 4. Major intracranial vessels are widely patent. Electronically Signed   By: Monte Fantasia M.D.   On: 09/09/2017 16:51   Ct Angio Neck W And/or Wo Contrast  Result Date: 09/09/2017 CLINICAL DATA:  Persistent central vertigo EXAM: CT ANGIOGRAPHY HEAD AND NECK TECHNIQUE: Multidetector CT imaging of the head and neck was performed using the standard protocol during bolus administration of intravenous contrast. Multiplanar CT image reconstructions and MIPs were obtained to evaluate the vascular anatomy. Carotid stenosis measurements (when applicable) are obtained utilizing  NASCET criteria, using  the distal internal carotid diameter as the denominator. CONTRAST:  154mL ISOVUE-370 IOPAMIDOL (ISOVUE-370) INJECTION 76% COMPARISON:  Brain MRI 07/23/2017. brain MRI 07/03/2013 FINDINGS: CT HEAD FINDINGS Brain: No evidence of acute infarction, hemorrhage, hydrocephalus, extra-axial collection or mass lesion/mass effect. Remote small vessel infarct in the left more than right cerebellum. Vascular: Atherosclerotic calcification. Skull: No acute or aggressive finding. Subgaleal lipoma along the left forehead. Sinuses: Chronic right sphenoid sinusitis with inspissated material based on MRI. There is chronic left posterior ethmoid sinusitis as well. Orbits: Bilateral cataract resection. Review of the MIP images confirms the above findings CTA NECK FINDINGS Aortic arch: Atherosclerotic plaque.  Two vessel branching. Right carotid system: Primarily calcified plaque at the common carotid bifurcation with proximal ICA stenosis measuring up to 40% on sagittal reformats. No ulceration, beading, or dissection. Left carotid system: Calcified plaque on the proximal common carotid without flow limiting stenosis. Calcified plaque at the common carotid bifurcation and ICA bulb without significant stenosis. No beading, ulceration, or dissection. Vertebral arteries: Proximal subclavian atherosclerosis without flow limiting stenosis. The dominant right vertebral artery is smooth and widely patent to the dura. There is chronic nonvisualization of the left vertebral artery until think vessel at V3. The left PICA is patent, likely from retrograde flow. Skeleton: C3-C5 corpectomy with ventral plate. No acute osseous finding. Other neck: No incidental mass or inflammation. Upper chest: Negative Review of the MIP images confirms the above findings CTA HEAD FINDINGS Anterior circulation: Calcification in the carotid siphons without stenosis. No branch occlusion or aneurysm. Posterior circulation: Strong right vertebral  artery dominance. Right V4 segment calcified plaque with mild luminal narrowing. The basilar is smooth and widely patent. Robust flow in posterior circulation branches Venous sinuses: Patent Anatomic variants: None significant Delayed phase: No abnormal intracranial enhancement. Review of the MIP images confirms the above findings IMPRESSION: 1. No emergent finding. 2. Left vertebral occlusion with downstream reconstitution, present since at least 2015. Small remote cerebellar infarcts may have been related. 3. Cervical carotid atherosclerosis with up to 40% proximal right ICA stenosis. 4. Major intracranial vessels are widely patent. Electronically Signed   By: Monte Fantasia M.D.   On: 09/09/2017 16:51    Procedures Procedures (including critical care time)  Medications Ordered in ED Medications  iopamidol (ISOVUE-370) 76 % injection 100 mL (100 mLs Intravenous Contrast Given 09/09/17 1610)     Initial Impression / Assessment and Plan / ED Course  I have reviewed the triage vital signs and the nursing notes.  Pertinent labs & imaging results that were available during my care of the patient were reviewed by me and considered in my medical decision making (see chart for details).     Auburn Hert is a 77 y.o. male with a past medical history significant for CAD status post PCI, hypertension, CKD, prior pulmonary embolism on Xarelto, diabetes, chronic left vertebral artery occlusion, prior stroke, and numerous TIAs who presents with right-sided neck pain, persistent room spinning dizziness, and vision changes.  He reports that 3 days ago, on Friday, at 6 PM patient was playing with his grandson when he felt unsteady.  He reports that he feels like he is "on a ship" with spinning around him.  He says that he cannot maintain his balance well.  He reports that over the last 2 days it has slightly improved.  He says that he had a headache initially on Friday which had improved as well.  He  currently has no headache but still has a 2 out  of 10 right sided neck aching.  He reports that he has had blurry vision for the last 3 days that has also gradually begun to improve.  He denies nausea, vomiting, chest pain, shortness of breath, palpitations, constipation, diarrhea, or urinary symptoms.  He has not fallen with this but he continues to need to hold onto things for the unsteadiness.  He reports that he has had dizziness in the past with prior strokes and has tried meclizine.  He says that meclizine did not help over the last 2 days.  He reports has been staying in bed due to the difficulty with ambulation.    On exam, patient was able to take several steps without falling over.  He still felt unsteady.  He had normal finger-nose finger testing bilaterally.  Normal sensation in all extremities.  Normal strength in all extremities.  No facial droop.  Pupils were reactive bilaterally with normal extraocular movements.  No facial droop.  Patient had clear speech.  Patient had no neck tenderness and had normal neck range of motion.  Back was nontender.  No evidence of trauma was seen.  Based on patient's history of numerous strokes in the vertebral artery occlusion, I am concerned about a neurologic cause of his symptoms.  With the new right-sided neck pain, given his known left occlusion I am concerned he may have a right-sided vertebral artery problem.  Patient will have screening laboratory testing and will get CTA of the head and neck at this facility.  I spoke with neurology and they are going to recommend getting the CT scans of outlined above as well as transfer to the ED at [facility] for MRI once this is completed.  5:26 PM Patient's laboratory testing began to return.  Initial troponin was negative.  Metabolic panel showed elevated creatinine similar to prior.  CBC reassuring.  CTA of the head and neck did not show evidence of new dissection or occlusion.  Patient is chronic left  vertebral artery occlusion since 2015 which looks unchanged.  No emergent findings seen on the CTA.  After reassuring CTA, patient will be transferred to [facility] for the MRI which has been ordered.  Patient requested to go by personal vehicle and since his symptoms have remained stable here and he has had this for several days, we feel this is reasonable as he will be driven by family.    Dr. Ellender Hose at [facility] excepted the patient in transfer and patient will be in ED to ED transfer for further work-up.  If MRI is negative for acute stroke, patient will likely be stable for discharge home to continue his home meclizine.  If MRI reveals stroke, patient will likely need neurology evaluation.    Patient transferred in stable condition with no worsening of symptoms.      Final Clinical Impressions(s) / ED Diagnoses   Final diagnoses:  Dizziness  Neck pain  Blurry vision, bilateral    Clinical Impression: 1. Dizziness   2. Neck pain   3. Blurry vision, bilateral     Disposition: Patient transferred to [facility] [facility]  Anticipate discharge if MRI shows no new stroke.  If stroke is found, consult neurology.  This note was prepared with assistance of Systems analyst. Occasional wrong-word or sound-a-like substitutions may have occurred due to the inherent limitations of voice recognition software.      Ofelia Podolski, Gwenyth Allegra, MD 09/10/17 [phone]

## 2017-09-09 NOTE — ED Notes (Signed)
Pt to MRI at this time.

## 2017-09-09 NOTE — ED Provider Notes (Signed)
[name] EMERGENCY DEPARTMENT Provider Note   CSN: [id number] Arrival date & time: 09/09/17  1433     History   Chief Complaint Chief Complaint  Patient presents with  . Dizziness    HPI Steve Brown is a 77 y.o. male.  The history is provided by the patient.  Dizziness  Quality:  Lightheadedness, vertigo and room spinning Severity:  Moderate Onset quality:  Gradual Duration:  1 month (worse over the last 3 days) Timing:  Intermittent Progression:  Waxing and waning Chronicity:  New Context: standing up   Context: not with head movement   Relieved by:  Lying down (meclizine) Worsened by:  Movement Associated symptoms: no chest pain, no diarrhea, no nausea, no palpitations, no shortness of breath and no vomiting   Risk factors: hx of stroke (2002) and hx of vertigo     Past Medical History:  Diagnosis Date  . Abnormal prostate biopsy   . Anticoagulant long-term use    currently xarelto  . BPH with elevated PSA   . CKD (chronic kidney disease), stage II   . Complication of anesthesia    limted neck rom limited use of left arm due to cva  . Coronary artery disease    CARDIOLOGIST-  DR Irish Lack--  2010-- PCI w/ stenting midLAD  . DDD (degenerative disc disease), lumbar   . Degeneration of cervical intervertebral disc   . Depression   . Dyspnea on exertion   . GERD (gastroesophageal reflux disease)   . Hemiparesis due to cerebral infarction   . History of cerebrovascular accident (CVA) with residual deficit 2002 and 2003--  hemiparisis both sides   per MRI  anterior left frontal lobe, left para midline pons, and inferior cerebullam bilaterally infarcts  . History of pulmonary embolus (PE)    06-30-2012  extensive bilaterally  . History of recurrent TIAs   . History of syncope    hx multiple pre-syncope and syncopal episodes due to vasovagal, orthostatic hypotension, dehydration  . History of TIAs    several since 2002  . Hyperlipidemia    . Hypertension   . Mild atherosclerosis of carotid artery, bilateral    per last duplex 11-04-2014  bilateral ICA 1--39%  . Neuropathy    fingers  . OSA on CPAP    followed by dr dohmeier--  sev. osa w/ AHI 65.9  . Prostate cancer ([place]) dx 2018  . Renal insufficiency   . S/P coronary artery stent placement 2010   stenting to mid LAD  . Simple renal cyst    bilaterally  . Trigger finger of both hands 11-17-13  . Type 2 diabetes mellitus ([place]) dx 1986   last one A1c 9.2 on 04-26-2016  . Unsteady gait    . Hx prior CVA/TIAs;  . Vertebral artery occlusion, left    chronic    Patient Active Problem List   Diagnosis Date Noted  . Radiation therapy complication 25/85/2778  . Primary prostate cancer ([place]) 07/31/2017  . Anemia 07/31/2017  . Vertigo due to cerebrovascular disease 07/31/2017  . Poor compliance with CPAP treatment 07/31/2017  . Absolute anemia 05/16/2017  . Avitaminosis D 05/16/2017  . Benign essential HTN 05/16/2017  . Benign prostatic hypertrophy without urinary obstruction 05/16/2017  . Clinical depression 05/16/2017  . CN (constipation) 05/16/2017  . Current drug use 05/16/2017  . Diabetic neuropathy ([place]) 05/16/2017  . Genital herpes 05/16/2017  . Infarction of lung due to iatrogenic pulmonary embolism ([place]) 05/16/2017  . Peripheral neuralgia  05/16/2017  . Arteriosclerosis of coronary artery 05/16/2017  . Artery disease, cerebral 05/16/2017  . Apnea, sleep 05/16/2017  . Arthralgia of hip or thigh 05/16/2017  . Malignant neoplasm of prostate ([place]) 09/28/2016  . Vascular dementia in remission 09/28/2016  . Remote history of stroke 09/28/2016  . Acute kidney injury ([place]) 05/18/2016  . Dehydration 05/18/2016  . Near syncope 05/18/2016  . Orthostatic hypotension 05/18/2016  . CKD (chronic kidney disease), stage II 04/26/2016  . UTI (urinary tract infection) 04/26/2016  . Encounter for counseling on use of CPAP 11/18/2015  . Chronic cholecystitis with  calculus 11/02/2015  . Stroke, 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[place]) 04/22/2015  . TIA (transient ischemic attack) 11/03/2014  . OSA on CPAP 05/18/2014  . White matter disease 08/14/2013  . OSA (obstructive sleep apnea) 08/14/2013  . Abnormal x-ray of temporomandibular joint 05/30/2013  . Chronic infection of sinus 05/30/2013  . Cough 05/30/2013  . Fatigue 05/30/2013  . Unsteady gait 05/19/2013  . Combined fat and carbohydrate induced hyperlipemia 04/24/2013  . Syncope 03/20/2013  . Angina pectoris ([place]) 10/08/2012  . Hypertension   . Stroke ([place])   . History of TIAs   . Lumbago   . Other and unspecified hyperlipidemia   . Personal history of unspecified circulatory disease   . Unspecified fall   . Pain in joint, multiple sites   . Degeneration of cervical intervertebral disc   . Unspecified cardiovascular disease   . History of pulmonary embolism: June 2014,  Takes Xarelto 07/01/2012    Class: History of  . Hemiparesis ([place]) 07/18/2011  . Diabetes mellitus type 2 with complications ([place]) 35/57/3220  . HTN (hypertension) 07/18/2011  . CAD in native artery 07/18/2011  . History of recurrent TIAs 07/18/2011    Past Surgical History:  Procedure Laterality Date  . ANTERIOR CERVICAL DECOMP/DISCECTOMY FUSION  2004   C3 -- C6 limited rom  . CARDIAC CATHETERIZATION  06-10-2010   dr Irish Lack   wide patent LAD stent, mid lesion at the origin of the septal prior to the previous stent 40-50%/  normal LVF, ef 55%  . CARDIOVASCULAR STRESS TEST  10-23-2012  dr Irish Lack   normal nuclear perfusion study w/ no ischemia/  normal LV function and wall motion , ef 65%  . CARPAL TUNNEL RELEASE Bilateral   . CATARACT EXTRACTION W/ INTRAOCULAR LENS  IMPLANT, BILATERAL    . CHOLECYSTECTOMY N/A 11/02/2015   Procedure: LAPAROSCOPIC CHOLECYSTECTOMY WITH INTRAOPERATIVE CHOLANGIOGRAM;  Surgeon: Donnie Mesa, MD;  Location: [facility];  Service: General;  Laterality: N/A;  . COLONOSCOPY    . CORONARY ANGIOPLASTY WITH  STENT PLACEMENT  02/2008   stenting to mid LAD  . GOLD SEED IMPLANT N/A 11/15/2016   Procedure: GOLD SEED IMPLANT TIMES THREE;  Surgeon: Ardis Hughs, MD;  Location[facility];  Service: Urology;  Laterality: N/A;  . LEFT HEART CATH AND CORONARY ANGIOGRAPHY N/A 05/25/2017   Procedure: LEFT HEART CATH AND CORONARY ANGIOGRAPHY;  Surgeon: Jettie Booze, MD;  Location: [facility] CV LAB;  Service: Cardiovascular;  Laterality: N/A;  . LEFT HEART CATHETERIZATION WITH CORONARY ANGIOGRAM N/A 04/03/2013   Procedure: LEFT HEART CATHETERIZATION WITH CORONARY ANGIOGRAM;  Surgeon: Jettie Booze, MD;  Location[facility] CATH LAB;  Service: Cardiovascular;  Laterality: N/A;  patent mLAD stent  w/ mild disease in remainder LAD and its branches;  mod. focal lesion midLCFx- FFR of lesion was negative for ischemia/  normal LVSF, ef 50%  . lungs  2005   "fluid  pumped off lungs"  . NEUROPLASTY / TRANSPOSITION ULNAR NERVE AT ELBOW Right 2004  . PROSTATE BIOPSY N/A 08/31/2016   Procedure: PROSTATE  BIOPSY TRANSRECTAL ULTRASONIC PROSTATE (TUBP);  Surgeon: Ardis Hughs, MD;  Location: [facility];  Service: Urology;  Laterality: N/A;  . SPACE OAR INSTILLATION N/A 11/15/2016   Procedure: SPACE OAR INSTILLATION;  Surgeon: Ardis Hughs, MD;  Location[facility];  Service: Urology;  Laterality: N/A;  . TRANSTHORACIC ECHOCARDIOGRAM  04/27/2016   severe focal basal LVH, ef 60-65%,  grade 2 diastoilc dysfunction/  mild AR, MR, and TR/  atrial septum lipomatous hypertrophy/  PASP 45mmHg  . UMBILICAL HERNIA REPAIR          Home Medications    Prior to Admission medications   Medication Sig Start Date End Date Taking? Authorizing Provider  acetaminophen (TYLENOL) 500 MG tablet Take 1,000 mg by mouth every 6 (six) hours as needed (pain).    Yes [provider]  albuterol (PROVENTIL HFA;VENTOLIN HFA) 108 (90 Base) MCG/ACT inhaler Inhale 2 puffs  into the lungs every 6 (six) hours as needed for wheezing or shortness of breath.   Yes [provider]  Ascorbic Acid (VITAMIN C PO) Take 1 tablet by mouth daily.   Yes [provider]  bisacodyl (DULCOLAX) 5 MG EC tablet Take 5 mg by mouth daily as needed for moderate constipation.   Yes [provider]  cholecalciferol (VITAMIN D) 1000 UNITS tablet Take 1,000 Units by mouth daily.   Yes [provider]  diclofenac sodium (VOLTAREN) 1 % GEL Apply 1 application topically See admin instructions. Apply topically once daily , may use an additional time as needed for pain 10/16/16  Yes [provider]  Echinacea 450 MG CAPS Take 450 mg by mouth daily.    Yes [provider]  fluticasone (FLONASE) 50 MCG/ACT nasal spray Place 1 spray into both nostrils 2 (two) times daily.  09/26/16  Yes [provider]  furosemide (LASIX) 20 MG tablet Take 20 mg by mouth daily as needed (leg and ankle swelling).    Yes [provider]  Insulin Glargine (TOUJEO SOLOSTAR) 300 UNIT/ML SOPN Inject 20-25 Units into the skin daily before breakfast.    Yes [provider]  insulin lispro (HUMALOG KWIKPEN) 100 UNIT/ML KiwkPen Inject 6-10 Units into the skin 3 (three) times daily with meals.   Yes [provider]  IRON PO Take 1 tablet by mouth daily.   Yes [provider]  meclizine (ANTIVERT) 25 MG tablet Take 12.5-25 mg by mouth 3 (three) times daily as needed for dizziness.   Yes [provider]  metFORMIN (GLUCOPHAGE) 500 MG tablet Take 500 mg by mouth 2 (two) times daily as needed (CBG >200).    Yes [provider]  Multiple Vitamin (MULTIVITAMIN WITH MINERALS) TABS tablet Take 1 tablet by mouth daily.   Yes [provider]  nitroGLYCERIN (NITROSTAT) 0.4 MG SL tablet Place 1 tablet (0.4 mg total) under the tongue every 5 (five) minutes as needed for chest pain. 12/07/15  Yes Jettie Booze, MD    pantoprazole (PROTONIX) 40 MG tablet Take 40 mg by mouth daily as needed (acid reflux/heartburn).    Yes [provider]  pregabalin (LYRICA) 300 MG capsule Take 1 capsule (300 mg total) by mouth 2 (two) times daily. 07/26/12  Yes Dohmeier, Asencion Partridge, MD  rivaroxaban (XARELTO) 20 MG TABS tablet Take 1 tablet (20 mg total) by mouth  daily. Resume in 72 hours after procedure. Patient taking differently: Take 20 mg by mouth daily with breakfast. Resume in 72 hours after procedure. 05/26/17  Yes Jettie Booze, MD  rosuvastatin (CRESTOR) 10 MG tablet Take 10 mg by mouth at bedtime.    Yes [provider]  tamsulosin (FLOMAX) 0.4 MG CAPS capsule Take 0.4 mg by mouth daily. 05/09/16  Yes [provider]  valACYclovir (VALTREX) 500 MG tablet Take 500 mg by mouth daily.   Yes [provider]  azithromycin (ZITHROMAX) 250 MG tablet Take 250-500 mg by mouth See admin instructions. Take 2 tablets (500 mg) by mouth 1st day, then take 1 tablet (250 mg) daily on days 2-5 08/29/17   [provider]  BD PEN NEEDLE NANO U/F 32G X 4 MM MISC by Does not apply route.  09/01/13   [provider]  predniSONE (DELTASONE) 10 MG tablet Take 10 mg by mouth daily. 5 day course completed on 09/03/17 08/29/17   [provider]    Family History Family History  Problem Relation Age of Onset  . Aneurysm Mother   . Cancer Father        unknown either pancreatic or prostate  . Heart attack Neg Hx     Social History Social History   Tobacco Use  . Smoking status: Never Smoker  . Smokeless tobacco: Never Used  Substance Use Topics  . Alcohol use: No    Alcohol/week: 0.0 standard drinks  . Drug use: No     Allergies   Other and Phenergan [promethazine]   Review of Systems Review of Systems  Constitutional: Negative for chills and fever.  HENT: Positive for congestion, ear pain, rhinorrhea, sinus pressure and sinus pain. Negative for sore throat.    Eyes: Positive for visual disturbance. Negative for pain.  Respiratory: Negative for cough and shortness of breath.   Cardiovascular: Negative for chest pain and palpitations.  Gastrointestinal: Negative for abdominal pain, diarrhea, nausea and vomiting.  Genitourinary: Negative for dysuria and hematuria.  Musculoskeletal: Negative for arthralgias and back pain.  Skin: Negative for color change and rash.  Neurological: Positive for dizziness. Negative for seizures and syncope.  All other systems reviewed and are negative.    Physical Exam Updated Vital Signs BP 128/78 (BP Location: Right Arm)   Pulse 68   Temp 98.6 F (37 C) (Oral)   Resp 16   Ht 5\' 5"  (1.651 m)   Wt 84.8 kg   SpO2 100%   BMI 31.12 kg/m   Physical Exam  Constitutional: He appears well-developed and well-nourished.  HENT:  Head: Normocephalic and atraumatic.  Right Ear: Tympanic membrane normal.  Left Ear: Tympanic membrane normal.  Eyes: Conjunctivae are normal.  Neck: Neck supple.  Cardiovascular: Normal rate and regular rhythm.  No murmur heard. Pulmonary/Chest: Effort normal and breath sounds normal. No respiratory distress.  Abdominal: Soft. There is no tenderness.  Musculoskeletal: He exhibits no edema.  Neurological: He is alert.  Skin: Skin is warm and dry.  Psychiatric: He has a normal mood and affect.  Nursing note and vitals reviewed.    ED Treatments / Results  Labs (all labs ordered are listed, but only abnormal results are displayed) Labs Reviewed  BASIC METABOLIC PANEL - Abnormal; Notable for the following components:      Result Value   Glucose, Bld 212 (*)    BUN 36 (*)    Creatinine, Ser 1.44 (*)    GFR calc non Af Wyvonnia Lora  46 (*)    GFR calc Af Amer 53 (*)    All other components within normal limits  URINALYSIS, ROUTINE W REFLEX MICROSCOPIC - Abnormal; Notable for the following components:   Specific Gravity, Urine <1.005 (*)    Hgb urine dipstick TRACE (*)    All other  components within normal limits  URINALYSIS, MICROSCOPIC (REFLEX) - Abnormal; Notable for the following components:   Bacteria, UA RARE (*)    All other components within normal limits  CBG MONITORING, ED - Abnormal; Notable for the following components:   Glucose-Capillary 207 (*)    All other components within normal limits  CBG MONITORING, ED - Abnormal; Notable for the following components:   Glucose-Capillary 141 (*)    All other components within normal limits  URINE CULTURE  CBC  TROPONIN I    EKG EKG Interpretation  Date/Time:  Sunday September 09 2017 14:49:33 EDT Ventricular Rate:  68 PR Interval:    QRS Duration: 86 QT Interval:  370 QTC Calculation: 394 R Axis:   3 Text Interpretation:  Sinus rhythm Atrial premature complexes Abnormal R-wave progression, early transition When compared to prior, no significnat changes seen.  No STEMI Confirmed by Antony Blackbird [phone]) on 09/09/2017 3:49:47 PM Also confirmed by Antony Blackbird [phone]), editor Philomena Doheny [phone])  on 09/10/2017 8:38:13 AM   Radiology Ct Angio Head W Or Wo Contrast  Result Date: 09/09/2017 CLINICAL DATA:  Persistent central vertigo EXAM: CT ANGIOGRAPHY HEAD AND NECK TECHNIQUE: Multidetector CT imaging of the head and neck was performed using the standard protocol during bolus administration of intravenous contrast. Multiplanar CT image reconstructions and MIPs were obtained to evaluate the vascular anatomy. Carotid stenosis measurements (when applicable) are obtained utilizing NASCET criteria, using the distal internal carotid diameter as the denominator. CONTRAST:  174mL ISOVUE-370 IOPAMIDOL (ISOVUE-370) INJECTION 76% COMPARISON:  Brain MRI 07/23/2017. brain MRI 07/03/2013 FINDINGS: CT HEAD FINDINGS Brain: No evidence of acute infarction, hemorrhage, hydrocephalus, extra-axial collection or mass lesion/mass effect. Remote small vessel infarct in the left more than right cerebellum. Vascular: Atherosclerotic  calcification. Skull: No acute or aggressive finding. Subgaleal lipoma along the left forehead. Sinuses: Chronic right sphenoid sinusitis with inspissated material based on MRI. There is chronic left posterior ethmoid sinusitis as well. Orbits: Bilateral cataract resection. Review of the MIP images confirms the above findings CTA NECK FINDINGS Aortic arch: Atherosclerotic plaque.  Two vessel branching. Right carotid system: Primarily calcified plaque at the common carotid bifurcation with proximal ICA stenosis measuring up to 40% on sagittal reformats. No ulceration, beading, or dissection. Left carotid system: Calcified plaque on the proximal common carotid without flow limiting stenosis. Calcified plaque at the common carotid bifurcation and ICA bulb without significant stenosis. No beading, ulceration, or dissection. Vertebral arteries: Proximal subclavian atherosclerosis without flow limiting stenosis. The dominant right vertebral artery is smooth and widely patent to the dura. There is chronic nonvisualization of the left vertebral artery until think vessel at V3. The left PICA is patent, likely from retrograde flow. Skeleton: C3-C5 corpectomy with ventral plate. No acute osseous finding. Other neck: No incidental mass or inflammation. Upper chest: Negative Review of the MIP images confirms the above findings CTA HEAD FINDINGS Anterior circulation: Calcification in the carotid siphons without stenosis. No branch occlusion or aneurysm. Posterior circulation: Strong right vertebral artery dominance. Right V4 segment calcified plaque with mild luminal narrowing. The basilar is smooth and widely patent. Robust flow in posterior circulation branches Venous sinuses: Patent Anatomic variants: None significant Delayed  phase: No abnormal intracranial enhancement. Review of the MIP images confirms the above findings IMPRESSION: 1. No emergent finding. 2. Left vertebral occlusion with downstream reconstitution, present  since at least 2015. Small remote cerebellar infarcts may have been related. 3. Cervical carotid atherosclerosis with up to 40% proximal right ICA stenosis. 4. Major intracranial vessels are widely patent. Electronically Signed   By: Monte Fantasia M.D.   On: 09/09/2017 16:51   Ct Angio Neck W And/or Wo Contrast  Result Date: 09/09/2017 CLINICAL DATA:  Persistent central vertigo EXAM: CT ANGIOGRAPHY HEAD AND NECK TECHNIQUE: Multidetector CT imaging of the head and neck was performed using the standard protocol during bolus administration of intravenous contrast. Multiplanar CT image reconstructions and MIPs were obtained to evaluate the vascular anatomy. Carotid stenosis measurements (when applicable) are obtained utilizing NASCET criteria, using the distal internal carotid diameter as the denominator. CONTRAST:  150mL ISOVUE-370 IOPAMIDOL (ISOVUE-370) INJECTION 76% COMPARISON:  Brain MRI 07/23/2017. brain MRI 07/03/2013 FINDINGS: CT HEAD FINDINGS Brain: No evidence of acute infarction, hemorrhage, hydrocephalus, extra-axial collection or mass lesion/mass effect. Remote small vessel infarct in the left more than right cerebellum. Vascular: Atherosclerotic calcification. Skull: No acute or aggressive finding. Subgaleal lipoma along the left forehead. Sinuses: Chronic right sphenoid sinusitis with inspissated material based on MRI. There is chronic left posterior ethmoid sinusitis as well. Orbits: Bilateral cataract resection. Review of the MIP images confirms the above findings CTA NECK FINDINGS Aortic arch: Atherosclerotic plaque.  Two vessel branching. Right carotid system: Primarily calcified plaque at the common carotid bifurcation with proximal ICA stenosis measuring up to 40% on sagittal reformats. No ulceration, beading, or dissection. Left carotid system: Calcified plaque on the proximal common carotid without flow limiting stenosis. Calcified plaque at the common carotid bifurcation and ICA bulb without  significant stenosis. No beading, ulceration, or dissection. Vertebral arteries: Proximal subclavian atherosclerosis without flow limiting stenosis. The dominant right vertebral artery is smooth and widely patent to the dura. There is chronic nonvisualization of the left vertebral artery until think vessel at V3. The left PICA is patent, likely from retrograde flow. Skeleton: C3-C5 corpectomy with ventral plate. No acute osseous finding. Other neck: No incidental mass or inflammation. Upper chest: Negative Review of the MIP images confirms the above findings CTA HEAD FINDINGS Anterior circulation: Calcification in the carotid siphons without stenosis. No branch occlusion or aneurysm. Posterior circulation: Strong right vertebral artery dominance. Right V4 segment calcified plaque with mild luminal narrowing. The basilar is smooth and widely patent. Robust flow in posterior circulation branches Venous sinuses: Patent Anatomic variants: None significant Delayed phase: No abnormal intracranial enhancement. Review of the MIP images confirms the above findings IMPRESSION: 1. No emergent finding. 2. Left vertebral occlusion with downstream reconstitution, present since at least 2015. Small remote cerebellar infarcts may have been related. 3. Cervical carotid atherosclerosis with up to 40% proximal right ICA stenosis. 4. Major intracranial vessels are widely patent. Electronically Signed   By: Monte Fantasia M.D.   On: 09/09/2017 16:51   Mr Brain Wo Contrast  Result Date: 09/09/2017 CLINICAL DATA:  Intermittent dizziness, gait imbalance and blurry vision for a month. Symptoms worsening for 3 days. History of stroke, hypertension, hyperlipidemia, LEFT vertebral artery occlusion. EXAM: MRI HEAD WITHOUT CONTRAST TECHNIQUE: Multiplanar, multiecho pulse sequences of the brain and surrounding structures were obtained without intravenous contrast. COMPARISON:  MRI head July 23, 2017 FINDINGS: Multiple sequences are moderately  motion degraded. INTRACRANIAL CONTENTS: No reduced diffusion to suggest acute ischemia. No susceptibility artifact  to suggest hemorrhage. The ventricles and sulci are normal for patient's age. Old LEFT greater than RIGHT cerebellar infarcts. Small area LEFT frontal lobe encephalomalacia. A few scattered subcentimeter supratentorial white matter FLAIR T2 hyperintensities compatible with mild chronic small vessel ischemic changes, less than expected for age. No suspicious parenchymal signal, masses, mass effect. No abnormal extra-axial fluid collections. No extra-axial masses. VASCULAR: T2 bright signal LEFT vertebral artery consistent with occlusion. SKULL AND UPPER CERVICAL SPINE: No abnormal sellar expansion. No suspicious calvarial bone marrow signal. Cervical spine plate and stretched. Craniocervical junction maintained. LEFT frontal scalp lipoma. SINUSES/ORBITS: Sphenoid ethmoidal sinusitis. Included ocular globes and orbital contents are non-suspicious. Status post bilateral ocular lens implants. OTHER: None. IMPRESSION: 1. No acute intracranial process on this motion degraded examination. 2. Old small LEFT frontal and bilateral cerebellar infarcts. 3. Mild chronic small vessel ischemic changes. 4. Chronically occluded LEFT vertebral artery. Electronically Signed   By: Elon Alas M.D.   On: 09/09/2017 22:18    Procedures Procedures (including critical care time)  Medications Ordered in ED Medications  iopamidol (ISOVUE-370) 76 % injection 100 mL (100 mLs Intravenous Contrast Given 09/09/17 1610)  LORazepam (ATIVAN) injection 1 mg (1 mg Intravenous Given 09/09/17 2103)     Initial Impression / Assessment and Plan / ED Course  I have reviewed the triage vital signs and the nursing notes.  Pertinent labs & imaging results that were available during my care of the patient were reviewed by me and considered in my medical decision making (see chart for details).     The patient is a 77 year old  man with past medical history of CAD, hypertension, PE on Xarelto, CKD, and previous CVA.  The patient presents with dizziness for the past 3 days.  Patient reports that it is worse or gets up from lying down.  He reports that it has improved with meclizine but not resolved.  CTA head and neck from [facility] ED reveals no acute intracranial abnormalities.  The patient was transferred here for an MRI.  On my exam, the patient has normal finger-to-nose, intact sensation, 5 out of 5 strength with flexion and extension of bilateral upper and lower extremities.. Intact cranial nerves.  From [facility] ED, CBC shows no significant leukocytosis or anemia.  BMP shows chronically elevated creatinine.  Troponin negative.  Here, MRI shows no acute infarcts.  Patient drowsy after being given Ativan due to claustrophobia in MRI scanner.  Patient was observed until he was alert and able to ambulate.  Patient instructed to follow-up with his neurologist and continue taking meclizine.  Patient given strict return precautions.  Patient reports understanding and agreement of plan.  Patient care supervised by Dr. Laverta [place].  Irven [place], MD  Final Clinical Impressions(s) / ED Diagnoses   Final diagnoses:  Dizziness  Neck pain  Blurry vision, bilateral  Vertigo    ED Discharge Orders    None       Irven [place], MD 09/10/17 1237    Margette Fast, MD 09/10/17 [phone]

## 2017-09-09 NOTE — ED Triage Notes (Signed)
Intermittent dizziness, balance issues and blurred vision over the past month. Has been seen by neuro and given meclizine and had multiple tests run. Pt states his symptoms have worsened since Friday.

## 2017-09-09 NOTE — ED Notes (Signed)
CBG collected per pts request. Result "[id number]." RN, Minna Merritts, notified.

## 2017-09-09 NOTE — ED Notes (Signed)
CT will need to wait for labs to result prior to imaging, pt will also require a 20G AC IV for CTA exam of head and neck

## 2017-09-10 NOTE — ED Notes (Signed)
PT states understanding of care given, follow up care. PT ambulated from ED to car with a steady gait.  

## 2017-09-10 NOTE — ED Notes (Signed)
Pt was able to ambulate in room with stand by assist. Gait was a little unsteady when pt walked back to the bed.

## 2017-09-11 ENCOUNTER — Ambulatory Visit: Payer: Medicare Other | Admitting: Physical Therapy

## 2017-09-11 LAB — URINE CULTURE: Culture: <10000 — AB

## 2017-09-13 ENCOUNTER — Ambulatory Visit: Payer: Medicare Other | Admitting: Physical Therapy

## 2017-09-13 DIAGNOSIS — I679 Cerebrovascular disease, unspecified: Secondary | ICD-10-CM | POA: Diagnosis not present

## 2017-09-13 DIAGNOSIS — R42 Dizziness and giddiness: Secondary | ICD-10-CM | POA: Diagnosis not present

## 2017-09-13 DIAGNOSIS — H538 Other visual disturbances: Secondary | ICD-10-CM | POA: Diagnosis not present

## 2017-09-17 DIAGNOSIS — E119 Type 2 diabetes mellitus without complications: Secondary | ICD-10-CM | POA: Diagnosis not present

## 2017-09-18 ENCOUNTER — Ambulatory Visit: Payer: Medicare Other | Attending: Neurology | Admitting: Physical Therapy

## 2017-09-18 VITALS — BP 145/70 | HR 61

## 2017-09-18 DIAGNOSIS — R42 Dizziness and giddiness: Secondary | ICD-10-CM | POA: Insufficient documentation

## 2017-09-18 DIAGNOSIS — R2689 Other abnormalities of gait and mobility: Secondary | ICD-10-CM | POA: Diagnosis not present

## 2017-09-18 DIAGNOSIS — R2681 Unsteadiness on feet: Secondary | ICD-10-CM

## 2017-09-18 NOTE — Patient Instructions (Addendum)
Cane Exercise: Extension / Internal Rotation    Stand holding cane behind back with both hands palm-up. Slide cane up spine toward head. Hold _3___ seconds. Repeat _10___ times. Do _1___ sessions per day.  http://gt2.exer.us/85    SHOULDER: Abduction / Adduction (Cane)    Hold cane with both hands. Raise arms out to one side. Repeat to other side. Hold _3__ seconds each side. Use _NO __ lb weight on cane. _10__ reps per set, __1_ sets per day, __5_ days per week   SHOULDER: Flexion - Sitting    Hold cane with both hands. Raise arms up. Keep elbows straight. Hold _3-5__ seconds. Use NO__ lb weight on cane. _10__ reps per set, _1__ sets per day, _5__ days per week   SHOULDER: Extension (Band)    Start with arm slightly forward. Holding band, pull backward, past hip, keeping elbow straight. Do not swing arm. Hold 3-5___ seconds. Use ___yellow_____ band. 10__ reps per set, _1__ sets per day, __5_ days per week   SHOULDER: Abduction (Band)    Holding band, raise arm out and up. Keep elbow straight. Do not shrug shoulders. Hold _3__ seconds. Use _YELLOW_______ band. _10__ reps per set, __1_ sets per day, _5__ days per week  Copyright  VHI. All rights reserved.  Shoulder Flexion: Sitting (Single Arm)    Anchor tubing under foot. Same side hand thumb up, raise arm forward. Repeat _10_ times per set. Repeat with other arm. Do 1__ sets per session. Do 5__ sessions per week.   http://tub.exer.us/281  Wall Push-Off: Assisted    Stand 3__ feet from wall, partner behind. Partner pushes you toward wall. Absorb impact with your arms. Immediately push back. Partner stops you and pushes back toward wall. Repeat _3__ times per set. Rest __10_ seconds after set. Do _1__ sets per session.  ALSO can weight bear through left arm on table - gently push down - hold 3-5 secs ; 10 reps               Gaze Stabilization: Tip Card  1.Target must remain in focus, not  blurry, and appear stationary while head is in motion. 2.Perform exercises with small head movements (45 to either side of midline). 3.Increase speed of head motion so long as target is in focus. 4.If you wear eyeglasses, be sure you can see target through lens (therapist will give specific instructions for bifocal / progressive lenses). 5.These exercises may provoke dizziness or nausea. Work through these symptoms. If too dizzy, slow head movement slightly. Rest between each exercise. 6.Exercises demand concentration; avoid distractions. 7.For safety, perform standing exercises close to a counter, wall, corner, or next to someone.  Copyright  VHI. All rights reserved.  Gaze Stabilization: Standing Feet Apart    Feet shoulder width apart, keeping eyes on target on wall ____ feet away, tilt head down 15-30 and move head side to side for ____ seconds. Repeat while moving head up and down for ____ seconds. Do ____ sessions per day. Repeat using target on pattern background.  Copyright  VHI. All rights reserved.

## 2017-09-19 ENCOUNTER — Encounter: Payer: Self-pay | Admitting: Physical Therapy

## 2017-09-19 NOTE — Therapy (Signed)
[facility] [address] [place], [place], [address] Phone: [phone]   Fax:  [phone]  Physical Therapy Treatment  Patient Details  Name: Steve Brown MRN: [id number] Date of Birth: 04-01-1940 Referring Provider: Asencion Partridge Dohmeier   Encounter Date: 09/18/2017  PT End of Session - 09/19/17 0902    Visit Number  4    Number of Visits  9    Date for PT Re-Evaluation  09/28/17    Authorization Type  Medicare and [employer]    Authorization Time Period  08-28-17 - 11-26-17    PT Start Time  4270    PT Stop Time  1401    PT Time Calculation (min)  44 min       Past Medical History:  Diagnosis Date  . Abnormal prostate biopsy   . Anticoagulant long-term use    currently xarelto  . BPH with elevated PSA   . CKD (chronic kidney disease), stage II   . Complication of anesthesia    limted neck rom limited use of left arm due to cva  . Coronary artery disease    CARDIOLOGIST-  DR Irish Lack--  2010-- PCI w/ stenting midLAD  . DDD (degenerative disc disease), lumbar   . Degeneration of cervical intervertebral disc   . Depression   . Dyspnea on exertion   . GERD (gastroesophageal reflux disease)   . Hemiparesis due to cerebral infarction   . History of cerebrovascular accident (CVA) with residual deficit 2002 and 2003--  hemiparisis both sides   per MRI  anterior left frontal lobe, left para midline pons, and inferior cerebullam bilaterally infarcts  . History of pulmonary embolus (PE)    06-30-2012  extensive bilaterally  . History of recurrent TIAs   . History of syncope    hx multiple pre-syncope and syncopal episodes due to vasovagal, orthostatic hypotension, dehydration  . History of TIAs    several since 2002  . Hyperlipidemia   . Hypertension   . Mild atherosclerosis of carotid artery, bilateral    per last duplex 11-04-2014  bilateral ICA 1--39%  . Neuropathy    fingers  . OSA on CPAP    followed by dr  dohmeier--  sev. osa w/ AHI 65.9  . Prostate cancer ([place]) dx 2018  . Renal insufficiency   . S/P coronary artery stent placement 2010   stenting to mid LAD  . Simple renal cyst    bilaterally  . Trigger finger of both hands 11-17-13  . Type 2 diabetes mellitus ([place]) dx 1986   last one A1c 9.2 on 04-26-2016  . Unsteady gait    . Hx prior CVA/TIAs;  . Vertebral artery occlusion, left    chronic    Past Surgical History:  Procedure Laterality Date  . ANTERIOR CERVICAL DECOMP/DISCECTOMY FUSION  2004   C3 -- C6 limited rom  . CARDIAC CATHETERIZATION  06-10-2010   dr Irish Lack   wide patent LAD stent, mid lesion at the origin of the septal prior to the previous stent 40-50%/  normal LVF, ef 55%  . CARDIOVASCULAR STRESS TEST  10-23-2012  dr Irish Lack   normal nuclear perfusion study w/ no ischemia/  normal LV function and wall motion , ef 65%  . CARPAL TUNNEL RELEASE Bilateral   . CATARACT EXTRACTION W/ INTRAOCULAR LENS  IMPLANT, BILATERAL    . CHOLECYSTECTOMY N/A 11/02/2015   Procedure: LAPAROSCOPIC CHOLECYSTECTOMY WITH INTRAOPERATIVE CHOLANGIOGRAM;  Surgeon: Donnie Mesa, MD;  Location: [facility];  Service: General;  Laterality: N/A;  . COLONOSCOPY    . CORONARY ANGIOPLASTY WITH STENT PLACEMENT  02/2008   stenting to mid LAD  . GOLD SEED IMPLANT N/A 11/15/2016   Procedure: GOLD SEED IMPLANT TIMES THREE;  Surgeon: Ardis Hughs, MD;  Location: [facility];  Service: Urology;  Laterality: N/A;  . LEFT HEART CATH AND CORONARY ANGIOGRAPHY N/A 05/25/2017   Procedure: LEFT HEART CATH AND CORONARY ANGIOGRAPHY;  Surgeon: Jettie Booze, MD;  Location: [facility];  Service: Cardiovascular;  Laterality: N/A;  . LEFT HEART CATHETERIZATION WITH CORONARY ANGIOGRAM N/A 04/03/2013   Procedure: LEFT HEART CATHETERIZATION WITH CORONARY ANGIOGRAM;  Surgeon: Jettie Booze, MD;  Location: [facility] CATH [facility];  Service: Cardiovascular;  Laterality: N/A;  patent mLAD stent  w/ mild  disease in remainder LAD and its branches;  mod. focal lesion midLCFx- FFR of lesion was negative for ischemia/  normal LVSF, ef 50%  . lungs  2005   "fluid pumped off lungs"  . NEUROPLASTY / TRANSPOSITION ULNAR NERVE AT ELBOW Right 2004  . PROSTATE BIOPSY N/A 08/31/2016   Procedure: PROSTATE  BIOPSY TRANSRECTAL ULTRASONIC PROSTATE (TUBP);  Surgeon: Ardis Hughs, MD;  Location: [facility];  Service: Urology;  Laterality: N/A;  . SPACE OAR INSTILLATION N/A 11/15/2016   Procedure: SPACE OAR INSTILLATION;  Surgeon: Ardis Hughs, MD;  Location: [facility];  Service: Urology;  Laterality: N/A;  . TRANSTHORACIC ECHOCARDIOGRAM  04/27/2016   severe focal basal LVH, ef 60-65%,  grade 2 diastoilc dysfunction/  mild AR, MR, and TR/  atrial septum lipomatous hypertrophy/  PASP 20mmHg  . UMBILICAL HERNIA REPAIR      Vitals:   09/18/17 1402  BP: (!) 145/70  Pulse: 61    Subjective Assessment - 09/19/17 0852    Subjective  Pt states he was in ED on 09-09-17 due to having had  mini-stroke; states he was transferred from [place] to [facility] to have MRI done; states MRI did not reveal any infarcts but states his left arm is weaker than it was and is a little swollen; states it had to have been a mini stroke; pt states dizziness is about the same as it was     Pertinent History  h/o CVA's - 2002 and 2003; h/o TIA's; vertigo due to cerebrovascular disease and as late effect of CVA (per referral); lumbar DDD (radiculopathy?) pt reports LBP with standing; Lt heart catherization 05-25-17; DOE    Patient Stated Goals  "Try to minimize the situation - help with balance and vertigo"    Currently in Pain?  No/denies                       [facility] Adult PT Treatment/Exercise - 09/19/17 0001      Exercises   Exercises  Shoulder      Shoulder Exercises: Standing   Flexion  Strengthening;Left;10 reps;Theraband   Yellow   ABduction  Strengthening;Left;10  reps;Theraband   yellow   Extension  Strengthening;Left;Theraband;10 reps   yellow   Other Standing Exercises  Pt performed wand exercises for Lt shoulder flexion, abduction, extension and internal rotation:  pt unable to attempt external rotation wand exercise     Other Standing Exercises  Pt performed weight bearing through LUE - pushing down on counter top to reduce tightness and weight bear through LUE - holding 5 secs for 3 reps       Vestibular Treatment/Exercise - 09/19/17  0001      Vestibular Treatment/Exercise   Gaze Exercises  X1 Viewing Horizontal;X1 Viewing Vertical      X1 Viewing Horizontal   Foot Position  bilateral stance    Time  --   for 60 secs   Reps  1    Comments  minimal c/o incr. dizziness      X1 Viewing Vertical   Foot Position  bilateral stance    Time  --   for 60 secs   Reps  1    Comments  minimal c/o incr. vertigo but pt reports slightly more dizziness with vertical than with horiozontal x 1 viewing             PT Education - 09/19/17 0901    Education Details  added LUE shoulder exercises to HEP due to mini-stroke which occurred on 09-09-17    Person(s) Educated  Patient    Methods  Explanation;Demonstration;Handout    Comprehension  Verbalized understanding;Returned demonstration          PT Long Term Goals - 08/29/17 2125      PT LONG TERM GOAL #1   Title  Improve TUG score from 15.81 secs to </= 13.5 secs without device to decr. fall risk.    Baseline  15.81 secs on 08-28-17    Time  4    Period  Weeks    Status  New    Target Date  09/28/17      PT LONG TERM GOAL #2   Title  Increase gait velocity to >/= 2.7 ft/sec without device for incr. gait efficiency.      Baseline  2.42 ft/sec = 13.57 secs    Time  4    Period  Weeks    Status  New    Target Date  09/28/17      PT LONG TERM GOAL #3   Title  Increase distance in 3" walk test by at least 75' to demo improved endurance.    Baseline  3" walk test to be completed      Time  4    Status  New    Target Date  09/28/17      PT LONG TERM GOAL #4   Title  Independent in HEP for balance exs.    Time  4    Period  Weeks    Status  New    Target Date  09/28/17            Plan - 09/19/17 0902    Clinical Impression Statement  Pt reports increased weakness in LUE as residual effect of mini-stroke which occurred on 09-09-17;  pt had Lt shoulder ROM limitations prior to most recent mini-stroke on 09-09-17 but states AROM is less at this time than it was prior to 09-09-17.  Pt reports no significant increased dizziness since this episode on 09-09-17.  Pt reported only slightly increased dizziness with x1 viewing exercise in standing.       Rehab Potential  Good    PT Frequency  2x / week    PT Duration  4 weeks    PT Treatment/Interventions  ADLs/Self Care Home Management;DME Instruction;Stair training;Gait training;Therapeutic activities;Therapeutic exercise;Balance training;Neuromuscular re-education;Patient/family education;Vestibular    PT Next Visit Plan  check shoulder exercises added to HEP on 09-18-17; continue balance and vestibular exercises     PT Home Exercise Plan  see above    Consulted and Agree with Plan of Care  Patient  Patient will benefit from skilled therapeutic intervention in order to improve the following deficits and impairments:  Cardiopulmonary status limiting activity, Decreased activity tolerance, Decreased balance, Dizziness, Pain, Abnormal gait, Decreased strength, Decreased endurance  Visit Diagnosis: Other abnormalities of gait and mobility  Unsteadiness on feet     Problem List Patient Active Problem List   Diagnosis Date Noted  . Radiation therapy complication 96/04/5407  . Primary prostate cancer ([place]) 07/31/2017  . Anemia 07/31/2017  . Vertigo due to cerebrovascular disease 07/31/2017  . Poor compliance with CPAP treatment 07/31/2017  . Absolute anemia 05/16/2017  . Avitaminosis D 05/16/2017  . Benign essential  HTN 05/16/2017  . Benign prostatic hypertrophy without urinary obstruction 05/16/2017  . Clinical depression 05/16/2017  . CN (constipation) 05/16/2017  . Current drug use 05/16/2017  . Diabetic neuropathy ([place]) 05/16/2017  . Genital herpes 05/16/2017  . Infarction of lung due to iatrogenic pulmonary embolism ([place]) 05/16/2017  . Peripheral neuralgia 05/16/2017  . Arteriosclerosis of coronary artery 05/16/2017  . Artery disease, cerebral 05/16/2017  . Apnea, sleep 05/16/2017  . Arthralgia of hip or thigh 05/16/2017  . Malignant neoplasm of prostate ([place]) 09/28/2016  . Vascular dementia in remission 09/28/2016  . Remote history of stroke 09/28/2016  . Acute kidney injury ([place]) 05/18/2016  . Dehydration 05/18/2016  . Near syncope 05/18/2016  . Orthostatic hypotension 05/18/2016  . CKD (chronic kidney disease), stage II 04/26/2016  . UTI (urinary tract infection) 04/26/2016  . Encounter for counseling on use of CPAP 11/18/2015  . Chronic cholecystitis with calculus 11/02/2015  . 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 TIA (transient ischemic attack) 11/03/2014  . OSA on CPAP 05/18/2014  . White matter disease 08/14/2013  . OSA (obstructive sleep apnea) 08/14/2013  . Abnormal x-ray of temporomandibular joint 05/30/2013  . Chronic infection of sinus 05/30/2013  . Cough 05/30/2013  . Fatigue 05/30/2013  . Unsteady gait 05/19/2013  . Combined fat and carbohydrate induced hyperlipemia 04/24/2013  . Syncope 03/20/2013  . Angina pectoris ([place]) 10/08/2012  . Hypertension   . Stroke ([place])   . History of TIAs   . Lumbago   . Other and unspecified hyperlipidemia   . Personal history of unspecified circulatory disease   . Unspecified fall   . Pain in joint, multiple sites   . Degeneration of cervical intervertebral disc   . Unspecified cardiovascular disease   . History of pulmonary embolism: June 2014,  Takes Xarelto 07/01/2012    Class: History of  . Hemiparesis ([place])  07/18/2011  . Diabetes mellitus type 2 with complications ([place]) 81/19/1478  . HTN (hypertension) 07/18/2011  . CAD in native artery 07/18/2011  . History of recurrent TIAs 07/18/2011    Alda Lea, PT 09/19/2017, 9:09 AM  [facility] [address] [place], [place], [address] Phone: [phone]   Fax:  [phone]  Name: Brevyn Ring MRN: [id number] Date of Birth: Jan 24, 1940

## 2017-09-20 ENCOUNTER — Ambulatory Visit: Payer: Medicare Other | Admitting: Physical Therapy

## 2017-09-20 ENCOUNTER — Telehealth: Payer: Self-pay | Admitting: *Deleted

## 2017-09-20 DIAGNOSIS — G894 Chronic pain syndrome: Secondary | ICD-10-CM | POA: Diagnosis not present

## 2017-09-20 DIAGNOSIS — M545 Low back pain: Secondary | ICD-10-CM | POA: Diagnosis not present

## 2017-09-20 DIAGNOSIS — R42 Dizziness and giddiness: Secondary | ICD-10-CM | POA: Diagnosis not present

## 2017-09-20 DIAGNOSIS — R2681 Unsteadiness on feet: Secondary | ICD-10-CM

## 2017-09-20 DIAGNOSIS — Z79891 Long term (current) use of opiate analgesic: Secondary | ICD-10-CM | POA: Diagnosis not present

## 2017-09-20 DIAGNOSIS — Z79899 Other long term (current) drug therapy: Secondary | ICD-10-CM | POA: Diagnosis not present

## 2017-09-20 DIAGNOSIS — R2689 Other abnormalities of gait and mobility: Secondary | ICD-10-CM | POA: Diagnosis not present

## 2017-09-20 DIAGNOSIS — M79606 Pain in leg, unspecified: Secondary | ICD-10-CM | POA: Diagnosis not present

## 2017-09-20 DIAGNOSIS — C61 Malignant neoplasm of prostate: Secondary | ICD-10-CM | POA: Diagnosis not present

## 2017-09-20 NOTE — Telephone Encounter (Signed)
I will mark his name down and call if we have cancellations to get him in.

## 2017-09-20 NOTE — Telephone Encounter (Signed)
Noted, will be on the look out.   

## 2017-09-20 NOTE — Telephone Encounter (Signed)
Patient stopped by the office to request an appt with Dr. Brett Fairy asap.  States he had a TIA this past weekend and was told he needed to see Dr. Brett Fairy.

## 2017-09-21 ENCOUNTER — Encounter: Payer: Self-pay | Admitting: Physical Therapy

## 2017-09-21 NOTE — Therapy (Signed)
[facility] [address] [place], [place], [address] Phone: [phone]   Fax:  [phone]  Physical Therapy Treatment  Patient Details  Name: Steve Brown MRN: [id number] Date of Birth: Feb 15, 1940 Referring Provider: Asencion Partridge Dohmeier   Encounter Date: 09/20/2017  PT End of Session - 09/21/17 1014    Visit Number  5    Number of Visits  9    Date for PT Re-Evaluation  09/28/17    Authorization Type  Medicare and [employer]    Authorization Time Period  08-28-17 - 11-26-17    PT Start Time  1316    PT Stop Time  1401    PT Time Calculation (min)  45 min    Equipment Utilized During Treatment  Gait belt       Past Medical History:  Diagnosis Date  . Abnormal prostate biopsy   . Anticoagulant long-term use    currently xarelto  . BPH with elevated PSA   . CKD (chronic kidney disease), stage II   . Complication of anesthesia    limted neck rom limited use of left arm due to cva  . Coronary artery disease    CARDIOLOGIST-  DR Irish Lack--  2010-- PCI w/ stenting midLAD  . DDD (degenerative disc disease), lumbar   . Degeneration of cervical intervertebral disc   . Depression   . Dyspnea on exertion   . GERD (gastroesophageal reflux disease)   . Hemiparesis due to cerebral infarction   . History of cerebrovascular accident (CVA) with residual deficit 2002 and 2003--  hemiparisis both sides   per MRI  anterior left frontal lobe, left para midline pons, and inferior cerebullam bilaterally infarcts  . History of pulmonary embolus (PE)    06-30-2012  extensive bilaterally  . History of recurrent TIAs   . History of syncope    hx multiple pre-syncope and syncopal episodes due to vasovagal, orthostatic hypotension, dehydration  . History of TIAs    several since 2002  . Hyperlipidemia   . Hypertension   . Mild atherosclerosis of carotid artery, bilateral    per last duplex 11-04-2014  bilateral ICA 1--39%  . Neuropathy     fingers  . OSA on CPAP    followed by dr dohmeier--  sev. osa w/ AHI 65.9  . Prostate cancer ([place]) dx 2018  . Renal insufficiency   . S/P coronary artery stent placement 2010   stenting to mid LAD  . Simple renal cyst    bilaterally  . Trigger finger of both hands 11-17-13  . Type 2 diabetes mellitus ([place]) dx 1986   last one A1c 9.2 on 04-26-2016  . Unsteady gait    . Hx prior CVA/TIAs;  . Vertebral artery occlusion, left    chronic    Past Surgical History:  Procedure Laterality Date  . ANTERIOR CERVICAL DECOMP/DISCECTOMY FUSION  2004   C3 -- C6 limited rom  . CARDIAC CATHETERIZATION  06-10-2010   dr Irish Lack   wide patent LAD stent, mid lesion at the origin of the septal prior to the previous stent 40-50%/  normal LVF, ef 55%  . CARDIOVASCULAR STRESS TEST  10-23-2012  dr Irish Lack   normal nuclear perfusion study w/ no ischemia/  normal LV function and wall motion , ef 65%  . CARPAL TUNNEL RELEASE Bilateral   . CATARACT EXTRACTION W/ INTRAOCULAR LENS  IMPLANT, BILATERAL    . CHOLECYSTECTOMY N/A 11/02/2015   Procedure: LAPAROSCOPIC CHOLECYSTECTOMY WITH INTRAOPERATIVE CHOLANGIOGRAM;  Surgeon:  Donnie Mesa, MD;  Location: [facility];  Service: General;  Laterality: N/A;  . COLONOSCOPY    . CORONARY ANGIOPLASTY WITH STENT PLACEMENT  02/2008   stenting to mid LAD  . GOLD SEED IMPLANT N/A 11/15/2016   Procedure: GOLD SEED IMPLANT TIMES THREE;  Surgeon: Ardis Hughs, MD;  Location: [facility];  Service: Urology;  Laterality: N/A;  . LEFT HEART CATH AND CORONARY ANGIOGRAPHY N/A 05/25/2017   Procedure: LEFT HEART CATH AND CORONARY ANGIOGRAPHY;  Surgeon: Jettie Booze, MD;  Location: [facility];  Service: Cardiovascular;  Laterality: N/A;  . LEFT HEART CATHETERIZATION WITH CORONARY ANGIOGRAM N/A 04/03/2013   Procedure: LEFT HEART CATHETERIZATION WITH CORONARY ANGIOGRAM;  Surgeon: Jettie Booze, MD;  Location: [facility];  Service: Cardiovascular;   Laterality: N/A;  patent mLAD stent  w/ mild disease in remainder LAD and its branches;  mod. focal lesion midLCFx- FFR of lesion was negative for ischemia/  normal LVSF, ef 50%  . lungs  2005   "fluid pumped off lungs"  . NEUROPLASTY / TRANSPOSITION ULNAR NERVE AT ELBOW Right 2004  . PROSTATE BIOPSY N/A 08/31/2016   Procedure: PROSTATE  BIOPSY TRANSRECTAL ULTRASONIC PROSTATE (TUBP);  Surgeon: Ardis Hughs, MD;  Location: [facility];  Service: Urology;  Laterality: N/A;  . SPACE OAR INSTILLATION N/A 11/15/2016   Procedure: SPACE OAR INSTILLATION;  Surgeon: Ardis Hughs, MD;  Location: [facility];  Service: Urology;  Laterality: N/A;  . TRANSTHORACIC ECHOCARDIOGRAM  04/27/2016   severe focal basal LVH, ef 60-65%,  grade 2 diastoilc dysfunction/  mild AR, MR, and TR/  atrial septum lipomatous hypertrophy/  PASP 54mmHg  . UMBILICAL HERNIA REPAIR      There were no vitals filed for this visit.  Subjective Assessment - 09/21/17 1009    Subjective  Pt reports "just a little bit of dizziness" at this time; states shoulder exs. given for HEP at previous PT session are going well    Pertinent History  h/o CVA's - 2002 and 2003; h/o TIA's; vertigo due to cerebrovascular disease and as late effect of CVA (per referral); lumbar DDD (radiculopathy?) pt reports LBP with standing; Lt heart catherization 05-25-17; DOE    Patient Stated Goals  "Try to minimize the situation - help with balance and vertigo"    Currently in Pain?  Yes    Pain Score  6     Pain Location  Other (Comment)   entire left side of body since TIA on 09-09-17   Pain Orientation  Left    Pain Descriptors / Indicators  Aching;Burning;Pins and needles    Pain Type  Neuropathic pain    Pain Onset  1 to 4 weeks ago    Pain Frequency  Constant                       [facility] Adult PT Treatment/Exercise - 09/21/17 0001      Exercises   Exercises  Knee/Hip      Knee/Hip Exercises:  Aerobic   Recumbent Bike  SiFit level 2.0 x 5" with UE's and LE's          Balance Exercises - 09/21/17 1011      Balance Exercises: Standing   Standing Eyes Opened  Narrow base of support (BOS);Wide (BOA);Head turns;Foam/compliant surface;5 reps    Standing Eyes Closed  Narrow base of support (BOS);Wide (BOA);Head turns;Foam/compliant surface    SLS  Eyes open;1 rep;10 secs    Tandem Gait  Forward;1 rep;Other (comment)   with CGA to min assist for LOB - 25' x 1   Other Standing Exercises  Pt performed amb. approx. 40' tossing ball and catching; amb. 40' making circles with ball clockwise and then 40' x1 making circles with ball counterclockwise for improved VOR       Pt performed balance/vestibular activities on red mat on incline/decline; marching in place with EO/EC with no head turns; progressing to Marching in place with EO with horizontal head turns x 5 reps, then vertical head turns x 5 reps with EO then with EC  Pt then performed this same sequence with marching on decline with red mat still on ramp;  Pt performed alternate stepping up/back  On incline with CGA and then alternate stepping down/back on decline 5 reps each leg  Red mat on floor - pt performed alternate crossovers with each leg x 1 rep, then 180 degree turn on mat - crossovers with each leg , then 180 degree turn for Total of 5 reps; this activity increased c/o dizziness but pt able to rest for approx. 1" and then continue   Tap over seam line on mat straight ahead, then tap in front of stance leg, then back to initial tap position, then to bilateral stance position - for improved SLS on Compliant surface - 5 reps each leg with CGA for safety  Alternate tap ups to 1st step 5 reps each, then to 2nd step 5 reps each - standing on floor Pt performed standing perpendicular on blue balance beam inside // bars with horizontal head turns 5 reps, then vertical head turns 5 reps with UE support prn     PT Long Term  Goals - 08/29/17 2125      PT LONG TERM GOAL #1   Title  Improve TUG score from 15.81 secs to </= 13.5 secs without device to decr. fall risk.    Baseline  15.81 secs on 08-28-17    Time  4    Period  Weeks    Status  New    Target Date  09/28/17      PT LONG TERM GOAL #2   Title  Increase gait velocity to >/= 2.7 ft/sec without device for incr. gait efficiency.      Baseline  2.42 ft/sec = 13.57 secs    Time  4    Period  Weeks    Status  New    Target Date  09/28/17      PT LONG TERM GOAL #3   Title  Increase distance in 3" walk test by at least 75' to demo improved endurance.    Baseline  3" walk test to be completed     Time  4    Status  New    Target Date  09/28/17      PT LONG TERM GOAL #4   Title  Independent in HEP for balance exs.    Time  4    Period  Weeks    Status  New    Target Date  09/28/17            Plan - 09/21/17 1015    Clinical Impression Statement  Pt tolerated balance exercises well but required frequent short rest periods due to fatigue with activity.  Pt reported slight increased dizziness experienced with exercises with EC on compliant surface, indicative of decreased vestibular input in maintaining balance.  Rehab Potential  Good    PT Frequency  2x / week    PT Duration  4 weeks    PT Treatment/Interventions  ADLs/Self Care Home Management;DME Instruction;Stair training;Gait training;Therapeutic activities;Therapeutic exercise;Balance training;Neuromuscular re-education;Patient/family education;Vestibular    PT Next Visit Plan  continue balance and vestibular exercises     PT Home Exercise Plan  see above    Consulted and Agree with Plan of Care  Patient       Patient will benefit from skilled therapeutic intervention in order to improve the following deficits and impairments:  Cardiopulmonary status limiting activity, Decreased activity tolerance, Decreased balance, Dizziness, Pain, Abnormal gait, Decreased strength, Decreased  endurance  Visit Diagnosis: Dizziness and giddiness  Unsteadiness on feet     Problem List Patient Active Problem List   Diagnosis Date Noted  . Radiation therapy complication 46/65/9935  . Primary prostate cancer ([place]) 07/31/2017  . Anemia 07/31/2017  . Vertigo due to cerebrovascular disease 07/31/2017  . Poor compliance with CPAP treatment 07/31/2017  . Absolute anemia 05/16/2017  . Avitaminosis D 05/16/2017  . Benign essential HTN 05/16/2017  . Benign prostatic hypertrophy without urinary obstruction 05/16/2017  . Clinical depression 05/16/2017  . CN (constipation) 05/16/2017  . Current drug use 05/16/2017  . Diabetic neuropathy ([place]) 05/16/2017  . Genital herpes 05/16/2017  . Infarction of lung due to iatrogenic pulmonary embolism ([place]) 05/16/2017  . Peripheral neuralgia 05/16/2017  . Arteriosclerosis of coronary artery 05/16/2017  . Artery disease, cerebral 05/16/2017  . Apnea, sleep 05/16/2017  . Arthralgia of hip or thigh 05/16/2017  . Malignant neoplasm of prostate ([place]) 09/28/2016  . Vascular dementia in remission 09/28/2016  . Remote history of stroke 09/28/2016  . Acute kidney injury ([place]) 05/18/2016  . Dehydration 05/18/2016  . Near syncope 05/18/2016  . Orthostatic hypotension 05/18/2016  . CKD (chronic kidney disease), stage II 04/26/2016  . UTI (urinary tract infection) 04/26/2016  . Encounter for counseling on use of CPAP 11/18/2015  . Chronic cholecystitis with calculus 11/02/2015  . 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 TIA (transient ischemic attack) 11/03/2014  . OSA on CPAP 05/18/2014  . White matter disease 08/14/2013  . OSA (obstructive sleep apnea) 08/14/2013  . Abnormal x-ray of temporomandibular joint 05/30/2013  . Chronic infection of sinus 05/30/2013  . Cough 05/30/2013  . Fatigue 05/30/2013  . Unsteady gait 05/19/2013  . Combined fat and carbohydrate induced hyperlipemia 04/24/2013  . Syncope 03/20/2013  . Angina pectoris  ([place]) 10/08/2012  . Hypertension   . Stroke ([place])   . History of TIAs   . Lumbago   . Other and unspecified hyperlipidemia   . Personal history of unspecified circulatory disease   . Unspecified fall   . Pain in joint, multiple sites   . Degeneration of cervical intervertebral disc   . Unspecified cardiovascular disease   . History of pulmonary embolism: June 2014,  Takes Xarelto 07/01/2012    Class: History of  . Hemiparesis ([place]) 07/18/2011  . Diabetes mellitus type 2 with complications ([place]) 70/17/7939  . HTN (hypertension) 07/18/2011  . CAD in native artery 07/18/2011  . History of recurrent TIAs 07/18/2011    Alda Lea, PT 09/21/2017, 10:38 AM  [facility] [address] [place], [place], [address] Phone: [phone]   Fax:  [phone]  Name: Raheel Kunkle MRN: [id number] Date of Birth: 1940/12/27

## 2017-09-21 NOTE — Telephone Encounter (Addendum)
Note pt has appt with Jinny Blossom NP on 09/25/17. Dr. Brett Fairy updated, will plan on pt f/u with [employer] as planned. Called pt to remind him of his appt. Received no answer and vm box was full.

## 2017-09-23 ENCOUNTER — Encounter: Payer: Self-pay | Admitting: Adult Health

## 2017-09-24 DIAGNOSIS — Z23 Encounter for immunization: Secondary | ICD-10-CM | POA: Diagnosis not present

## 2017-09-25 ENCOUNTER — Encounter: Payer: Self-pay | Admitting: Adult Health

## 2017-09-25 ENCOUNTER — Encounter

## 2017-09-25 ENCOUNTER — Ambulatory Visit: Payer: Medicare Other | Admitting: Physical Therapy

## 2017-09-25 ENCOUNTER — Ambulatory Visit (INDEPENDENT_AMBULATORY_CARE_PROVIDER_SITE_OTHER): Payer: Medicare Other | Admitting: Adult Health

## 2017-09-25 VITALS — BP 95/46 | HR 72 | Ht 65.0 in | Wt 185.0 lb

## 2017-09-25 DIAGNOSIS — Z9989 Dependence on other enabling machines and devices: Secondary | ICD-10-CM

## 2017-09-25 DIAGNOSIS — I2510 Atherosclerotic heart disease of native coronary artery without angina pectoris: Secondary | ICD-10-CM | POA: Diagnosis not present

## 2017-09-25 DIAGNOSIS — R42 Dizziness and giddiness: Secondary | ICD-10-CM

## 2017-09-25 DIAGNOSIS — R2689 Other abnormalities of gait and mobility: Secondary | ICD-10-CM | POA: Diagnosis not present

## 2017-09-25 DIAGNOSIS — G4733 Obstructive sleep apnea (adult) (pediatric): Secondary | ICD-10-CM | POA: Diagnosis not present

## 2017-09-25 DIAGNOSIS — R2681 Unsteadiness on feet: Secondary | ICD-10-CM | POA: Diagnosis not present

## 2017-09-25 NOTE — Progress Notes (Signed)
Community message sent via Epic to [place], [facility] re: CPAP order.

## 2017-09-25 NOTE — Patient Instructions (Addendum)
Your Plan:  Continue using CPAP nightly  Mask refitting Monitor BP If your symptoms worsen or you develop new symptoms please let us know.    Thank you for coming to see [place] at [facility]. I hope we have been able to provide you high quality care today.  You may receive a patient satisfaction survey over the next few weeks. We would appreciate your feedback and comments so that we may continue to improve ourselves and the health of our patients.

## 2017-09-25 NOTE — Progress Notes (Signed)
PATIENT: Hau Sanor DOB: Sep 07, 1940  REASON FOR VISIT: follow up HISTORY FROM: patient  HISTORY OF PRESENT ILLNESS: Today 09/25/17:  Mr. Hegeman is a 77 year old male with a history of obstructive sleep apnea on CPAP and dizziness.  He returns today for follow-up.  His CPAP download indicates that he use his machine 21 out of 30 days for compliance of 70%.  He uses machine greater than 4 hours 20 days for compliance of 67%.  On average he uses his machine 6 hours and 4 minutes.  His residual AHI is 4.2 on 13 cmH2O with EPR of 3.  His leak in the 95th percentile is 103.4 L/min.  Patient states that when he wakes up at night his mask is still on his face but it is leaking significantly.  He has tried adjusting his straps but it continues to leak.  He reports that he continues to have episodes of dizziness.  He is currently in vestibular rehab.  His blood pressure is low today.  He reports that he is no longer on any blood pressure medication.  He returns today for evaluation.   HISTORY  31 July 2017 (copied from Dr. Edwena Felty notes) for Mr. Murat Rideout, a 77 year old African-American right-handed gentleman who is presenting with cognitive concerns concerns of vascular origin.  He has been diagnosed with a history of multi-infarct cerebrovascular disease, hypertension, diabetes, has been followed for CPAP compliance, was on chronic anticoagulation but no longer is able to take aspirin. He reports dizziness - lightheaded, not exercise tolerant. Worse when standing up. Antivert was given by PCP.  This has to be seen in relation to changes in RBC and WBC - Dr Waymon Budge follows also his anticoagulants.    With CPAP he had for a while drastically improved his apnea count but he is not using the CPAP on a nightly basis.  He has now again nocturia and sleepiness, higher fatigue- see below.  CPAP Compliance for the last 30 days was only 30% and average of 1 hours 38 minutes, CPAP was set at 13  cm with 3 cm EPR he has a high air leak but the residual apnea count is only 2.2/h.  I am not sure how I can encourage him to use the machine more often.    In light of the his CPAP noncompliance is also the results of recent MRIs of the brain on 23 July 2017.  These were ordered by Dr. Harrington Challenger, his primary care physician.  MRI brain with and without contrast showed a 4 mm right periventricular occipital focus of reduced diffusion likely a new stroke, older strokes affect the right cerebellar region, the left frontal lobe, patchy pontine and supratentorial white matter intensities.  There is also chronically occluded left vertebral artery.  Known is also that the patient has myelomalacia and his cervical spine MRI was done without contrast = It showed between C3 and the 6 cervical vertebrae and anterior cervical fusion with arthrodesis, he does not have canal stenosis but neuroforaminal narrowing on multiple levels.  This may also explain some arm pain or shoulder pain or even weakness.   His Mini-Mental status examination today was 26 out of 30 points, the Epworth sleepiness score 11 points, fatigue severity was high at 47 out of 63 possible points.  The patient also presented with a very low white blood cell count to his primary care physician WBC was only 3.8, RBC 4.0 hemoglobin 12.9 hematocrit 38 MCV 95 which is high, neutrophils 1.6K  per microliter which is low.- has a diagnosis of non-diabetic retinopathy was a working diagnosis.  He is followed by Dr. Geroge Baseman, a podiatrist. He is not driving now.    REVIEW OF SYSTEMS: Out of a complete 14 system review of symptoms, the patient complains only of the following symptoms, and all other reviewed systems are negative.  See HPI  ALLERGIES: Allergies  Allergen Reactions  . Other Other (See Comments)    Per patient- cardiac cath dye-  "woke up during procedure hysterical."  . Phenergan [Promethazine] Other (See Comments)    Mood changes     HOME  MEDICATIONS: Outpatient Medications Prior to Visit  Medication Sig Dispense Refill  . acetaminophen (TYLENOL) 500 MG tablet Take 1,000 mg by mouth every 6 (six) hours as needed (pain).     Marland Kitchen albuterol (PROVENTIL HFA;VENTOLIN HFA) 108 (90 Base) MCG/ACT inhaler Inhale 2 puffs into the lungs every 6 (six) hours as needed for wheezing or shortness of breath.    . Ascorbic Acid (VITAMIN C PO) Take 1 tablet by mouth daily.    . BD PEN NEEDLE NANO U/F 32G X 4 MM MISC by Does not apply route.     . bisacodyl (DULCOLAX) 5 MG EC tablet Take 5 mg by mouth daily as needed for moderate constipation.    . cholecalciferol (VITAMIN D) 1000 UNITS tablet Take 1,000 Units by mouth daily.    . diclofenac sodium (VOLTAREN) 1 % GEL Apply 1 application topically See admin instructions. Apply topically once daily , may use an additional time as needed for pain  1  . Echinacea 450 MG CAPS Take 450 mg by mouth daily.     . fluticasone (FLONASE) 50 MCG/ACT nasal spray Place 1 spray into both nostrils 2 (two) times daily.   0  . furosemide (LASIX) 20 MG tablet Take 20 mg by mouth daily as needed (leg and ankle swelling).     . Insulin Glargine (TOUJEO SOLOSTAR) 300 UNIT/ML SOPN Inject 20-25 Units into the skin daily before breakfast.     . insulin lispro (HUMALOG KWIKPEN) 100 UNIT/ML KiwkPen Inject 6-10 Units into the skin 3 (three) times daily with meals.    . IRON PO Take 1 tablet by mouth daily.    . meclizine (ANTIVERT) 25 MG tablet Take 12.5-25 mg by mouth 3 (three) times daily as needed for dizziness.    . metFORMIN (GLUCOPHAGE) 500 MG tablet Take 500 mg by mouth 2 (two) times daily as needed (CBG >200).     . Multiple Vitamin (MULTIVITAMIN WITH MINERALS) TABS tablet Take 1 tablet by mouth daily.    . nitroGLYCERIN (NITROSTAT) 0.4 MG SL tablet Place 1 tablet (0.4 mg total) under the tongue every 5 (five) minutes as needed for chest pain. 25 tablet 12  . pantoprazole (PROTONIX) 40 MG tablet Take 40 mg by mouth daily as  needed (acid reflux/heartburn).     . pregabalin (LYRICA) 300 MG capsule Take 1 capsule (300 mg total) by mouth 2 (two) times daily. 180 capsule 3  . rivaroxaban (XARELTO) 20 MG TABS tablet Take 1 tablet (20 mg total) by mouth daily. Resume in 72 hours after procedure. (Patient taking differently: Take 20 mg by mouth daily with breakfast. Resume in 72 hours after procedure.) 30 tablet   . rosuvastatin (CRESTOR) 10 MG tablet Take 10 mg by mouth at bedtime.     . tamsulosin (FLOMAX) 0.4 MG CAPS capsule Take 0.4 mg by mouth daily.    . valACYclovir (  VALTREX) 500 MG tablet Take 500 mg by mouth daily.    Marland Kitchen azithromycin (ZITHROMAX) 250 MG tablet Take 250-500 mg by mouth See admin instructions. Take 2 tablets (500 mg) by mouth 1st day, then take 1 tablet (250 mg) daily on days 2-5  0  . predniSONE (DELTASONE) 10 MG tablet Take 10 mg by mouth daily. 5 day course completed on 09/03/17  0   No facility-administered medications prior to visit.     PAST MEDICAL HISTORY: Past Medical History:  Diagnosis Date  . Abnormal prostate biopsy   . Anticoagulant long-term use    currently xarelto  . BPH with elevated PSA   . CKD (chronic kidney disease), stage II   . Complication of anesthesia    limted neck rom limited use of left arm due to cva  . Coronary artery disease    CARDIOLOGIST-  DR Irish Lack--  2010-- PCI w/ stenting midLAD  . DDD (degenerative disc disease), lumbar   . Degeneration of cervical intervertebral disc   . Depression   . Dyspnea on exertion   . GERD (gastroesophageal reflux disease)   . Hemiparesis due to cerebral infarction   . History of cerebrovascular accident (CVA) with residual deficit 2002 and 2003--  hemiparisis both sides   per MRI  anterior left frontal lobe, left para midline pons, and inferior cerebullam bilaterally infarcts  . History of pulmonary embolus (PE)    06-30-2012  extensive bilaterally  . History of recurrent TIAs   . History of syncope    hx multiple  pre-syncope and syncopal episodes due to vasovagal, orthostatic hypotension, dehydration  . History of TIAs    several since 2002  . Hyperlipidemia   . Hypertension   . Mild atherosclerosis of carotid artery, bilateral    per last duplex 11-04-2014  bilateral ICA 1--39%  . Neuropathy    fingers  . OSA on CPAP    followed by dr dohmeier--  sev. osa w/ AHI 65.9  . Prostate cancer ([place]) dx 2018  . Renal insufficiency   . S/P coronary artery stent placement 2010   stenting to mid LAD  . Simple renal cyst    bilaterally  . Trigger finger of both hands 11-17-13  . Type 2 diabetes mellitus ([place]) dx 1986   last one A1c 9.2 on 04-26-2016  . Unsteady gait    . Hx prior CVA/TIAs;  . Vertebral artery occlusion, left    chronic    PAST SURGICAL HISTORY: Past Surgical History:  Procedure Laterality Date  . ANTERIOR CERVICAL DECOMP/DISCECTOMY FUSION  2004   C3 -- C6 limited rom  . CARDIAC CATHETERIZATION  06-10-2010   dr Irish Lack   wide patent LAD stent, mid lesion at the origin of the septal prior to the previous stent 40-50%/  normal LVF, ef 55%  . CARDIOVASCULAR STRESS TEST  10-23-2012  dr Irish Lack   normal nuclear perfusion study w/ no ischemia/  normal LV function and wall motion , ef 65%  . CARPAL TUNNEL RELEASE Bilateral   . CATARACT EXTRACTION W/ INTRAOCULAR LENS  IMPLANT, BILATERAL    . CHOLECYSTECTOMY N/A 11/02/2015   Procedure: LAPAROSCOPIC CHOLECYSTECTOMY WITH INTRAOPERATIVE CHOLANGIOGRAM;  Surgeon: Donnie Mesa, MD;  Location: [facility];  Service: General;  Laterality: N/A;  . COLONOSCOPY    . CORONARY ANGIOPLASTY WITH STENT PLACEMENT  02/2008   stenting to mid LAD  . GOLD SEED IMPLANT N/A 11/15/2016   Procedure: GOLD SEED IMPLANT TIMES THREE;  Surgeon: Ardis Hughs,  MD;  Location: [place];  Service: Urology;  Laterality: N/A;  . LEFT HEART CATH AND CORONARY ANGIOGRAPHY N/A 05/25/2017   Procedure: LEFT HEART CATH AND CORONARY ANGIOGRAPHY;  Surgeon:  Jettie Booze, MD;  Location: [facility];  Service: Cardiovascular;  Laterality: N/A;  . LEFT HEART CATHETERIZATION WITH CORONARY ANGIOGRAM N/A 04/03/2013   Procedure: LEFT HEART CATHETERIZATION WITH CORONARY ANGIOGRAM;  Surgeon: Jettie Booze, MD;  Location: [facility];  Service: Cardiovascular;  Laterality: N/A;  patent mLAD stent  w/ mild disease in remainder LAD and its branches;  mod. focal lesion midLCFx- FFR of lesion was negative for ischemia/  normal LVSF, ef 50%  . lungs  2005   "fluid pumped off lungs"  . NEUROPLASTY / TRANSPOSITION ULNAR NERVE AT ELBOW Right 2004  . PROSTATE BIOPSY N/A 08/31/2016   Procedure: PROSTATE  BIOPSY TRANSRECTAL ULTRASONIC PROSTATE (TUBP);  Surgeon: Ardis Hughs, MD;  Location: [facility];  Service: Urology;  Laterality: N/A;  . SPACE OAR INSTILLATION N/A 11/15/2016   Procedure: SPACE OAR INSTILLATION;  Surgeon: Ardis Hughs, MD;  Location: [facility];  Service: Urology;  Laterality: N/A;  . TRANSTHORACIC ECHOCARDIOGRAM  04/27/2016   severe focal basal LVH, ef 60-65%,  grade 2 diastoilc dysfunction/  mild AR, MR, and TR/  atrial septum lipomatous hypertrophy/  PASP 5mmHg  . UMBILICAL HERNIA REPAIR      FAMILY HISTORY: Family History  Problem Relation Age of Onset  . Aneurysm Mother   . Cancer Father        unknown either pancreatic or prostate  . Heart attack Neg Hx     SOCIAL HISTORY: Social History   Socioeconomic History  . Marital status: Divorced    Spouse name: Not on file  . Number of children: 4  . Years of education: 69  . Highest education level: Not on file  Occupational History    Employer: RETIRED    Comment: retired  Scientific laboratory technician  . Financial resource strain: Not on file  . Food insecurity:    Worry: Not on file    Inability: Not on file  . Transportation needs:    Medical: Not on file    Non-medical: Not on file  Tobacco Use  . Smoking status: Never  Smoker  . Smokeless tobacco: Never Used  Substance and Sexual Activity  . Alcohol use: No    Alcohol/week: 0.0 standard drinks  . Drug use: No  . Sexual activity: Yes  Lifestyle  . Physical activity:    Days per week: Not on file    Minutes per session: Not on file  . Stress: Not on file  Relationships  . Social connections:    Talks on phone: Not on file    Gets together: Not on file    Attends religious service: Not on file    Active member of club or organization: Not on file    Attends meetings of clubs or organizations: Not on file    Relationship status: Not on file  . Intimate partner violence:    Fear of current or ex partner: Not on file    Emotionally abused: Not on file    Physically abused: Not on file    Forced sexual activity: Not on file  Other Topics Concern  . Not on file  Social History Narrative   Patient lives at home alone and he is single.  Patient is retired.  Caffeine - one cups daily.   Right handed.   Patient has a high school education.   Patient has four adult children.      PHYSICAL EXAM  Vitals:   09/25/17 1302  BP: (!) 95/46  Pulse: 72  Weight: 185 lb (83.9 kg)  Height: 5\' 5"  (1.651 m)   Body mass index is 30.79 kg/m.  Generalized: Well developed, in no acute distress   Neurological examination  Mentation: Alert oriented to time, place, history taking. Follows all commands speech and language fluent Cranial nerve II-XII: Pupils were equal round reactive to light. Extraocular movements were full, visual field were full on confrontational test. Facial sensation and strength were normal. Uvula tongue midline. Head turning and shoulder shrug  were normal and symmetric. Motor: The motor testing reveals 5 over 5 strength of all 4 extremities. Good symmetric motor tone is noted throughout.  Sensory: Sensory testing is intact to soft touch on all 4 extremities. No evidence of extinction is noted.  Coordination: Cerebellar testing  reveals good finger-nose-finger and heel-to-shin bilaterally.  Gait and station: Gait is normal.  Reflexes: Deep tendon reflexes are symmetric and normal bilaterally.   DIAGNOSTIC DATA (LABS, IMAGING, TESTING) - I reviewed patient records, labs, notes, testing and imaging myself where available.  Lab Results  Component Value Date   WBC 7.4 09/09/2017   HGB 14.0 09/09/2017   HCT 39.5 09/09/2017   MCV 92.3 09/09/2017   PLT 200 09/09/2017      Component Value Date/Time   NA 137 09/09/2017 1524   NA 142 05/17/2017 1255   NA 140 08/14/2012 1513   K 4.7 09/09/2017 1524   K 4.3 08/14/2012 1513   CL 100 09/09/2017 1524   CO2 28 09/09/2017 1524   CO2 27 08/14/2012 1513   GLUCOSE 212 (H) 09/09/2017 1524   GLUCOSE 262 (H) 08/14/2012 1513   BUN 36 (H) 09/09/2017 1524   BUN 30 (H) 05/17/2017 1255   BUN 19.0 08/14/2012 1513   CREATININE 1.44 (H) 09/09/2017 1524   CREATININE 1.29 03/19/2014 1130   CREATININE 1.3 08/14/2012 1513   CALCIUM 9.1 09/09/2017 1524   CALCIUM 9.3 08/14/2012 1513   PROT 6.4 (L) 05/18/2016 1455   PROT 6.9 08/14/2012 1513   ALBUMIN 3.7 05/18/2016 1455   ALBUMIN 3.4 (L) 08/14/2012 1513   AST 26 05/18/2016 1455   AST 15 08/14/2012 1513   ALT 22 05/18/2016 1455   ALT 14 08/14/2012 1513   ALKPHOS 72 05/18/2016 1455   ALKPHOS 92 08/14/2012 1513   BILITOT 0.5 05/18/2016 1455   BILITOT 0.39 08/14/2012 1513   GFRNONAA 46 (L) 09/09/2017 1524   GFRNONAA 67 05/19/2013 1108   GFRAA 53 (L) 09/09/2017 1524   GFRAA 78 05/19/2013 1108   Lab Results  Component Value Date   CHOL 144 03/21/2013   HDL 57 03/21/2013   LDLCALC 63 03/21/2013   TRIG 122 03/21/2013   CHOLHDL 2.5 03/21/2013   Lab Results  Component Value Date   HGBA1C 9.2 (H) 04/26/2016   No results found for: VITAMINB12 Lab Results  Component Value Date   TSH 0.618 03/20/2013      ASSESSMENT AND PLAN 77 y.o. year old male  has a past medical history of Abnormal prostate biopsy, Anticoagulant  long-term use, BPH with elevated PSA, CKD (chronic kidney disease), stage II, Complication of anesthesia, Coronary artery disease, DDD (degenerative disc disease), lumbar, Degeneration of cervical intervertebral disc, Depression, Dyspnea on exertion, GERD (gastroesophageal reflux disease), Hemiparesis due  to cerebral infarction, History of cerebrovascular accident (CVA) with residual deficit (2002 and 2003--  hemiparisis both sides), History of pulmonary embolus (PE), History of recurrent TIAs, History of syncope, History of TIAs, Hyperlipidemia, Hypertension, Mild atherosclerosis of carotid artery, bilateral, Neuropathy, OSA on CPAP, Prostate cancer ([place]) (dx 2018), Renal insufficiency, S/P coronary artery stent placement (2010), Simple renal cyst, Trigger finger of both hands (11-17-13), Type 2 diabetes mellitus ([place]) (dx 1986), Unsteady gait, and Vertebral artery occlusion, left. here with :  1.  Obstructive sleep apnea on CPAP 2.  Dizziness  The patient is encouraged to continue using the CPAP nightly and greater than 4 hours each night.  I will send him to his DME company for mask refitting.  He is encouraged to continue with vestibular rehab.  He is advised that if his symptoms worsen or he develops new symptoms he should let us know.  He will follow-up in 6 months or sooner if needed.     Ward Givens, MSN, NP-C 09/25/2017, 1:30 PM [facility] [address], [place], [place] [address] [phone]

## 2017-09-26 ENCOUNTER — Ambulatory Visit: Payer: Medicare Other | Admitting: Neurology

## 2017-09-26 ENCOUNTER — Encounter: Payer: Self-pay | Admitting: Physical Therapy

## 2017-09-26 NOTE — Therapy (Signed)
[facility] [address] [place], [place], [address] Phone: [phone]   Fax:  [phone]  Physical Therapy Treatment  Patient Details  Name: Steve Brown MRN: [id number] Date of Birth: 09/22/1940 Referring Provider: Asencion Partridge Dohmeier   Encounter Date: 09/25/2017  PT End of Session - 09/26/17 1029    Visit Number  6    Number of Visits  9    Date for PT Re-Evaluation  10/05/17   extended POC by 1 week due to missed appts due to lack of schedule availability   Authorization Type  Medicare and [employer]    Authorization Time Period  08-28-17 - 11-26-17    PT Start Time  1019    PT Stop Time  1103    PT Time Calculation (min)  44 min       Past Medical History:  Diagnosis Date  . Abnormal prostate biopsy   . Anticoagulant long-term use    currently xarelto  . BPH with elevated PSA   . CKD (chronic kidney disease), stage II   . Complication of anesthesia    limted neck rom limited use of left arm due to cva  . Coronary artery disease    CARDIOLOGIST-  DR Irish Lack--  2010-- PCI w/ stenting midLAD  . DDD (degenerative disc disease), lumbar   . Degeneration of cervical intervertebral disc   . Depression   . Dyspnea on exertion   . GERD (gastroesophageal reflux disease)   . Hemiparesis due to cerebral infarction   . History of cerebrovascular accident (CVA) with residual deficit 2002 and 2003--  hemiparisis both sides   per MRI  anterior left frontal lobe, left para midline pons, and inferior cerebullam bilaterally infarcts  . History of pulmonary embolus (PE)    06-30-2012  extensive bilaterally  . History of recurrent TIAs   . History of syncope    hx multiple pre-syncope and syncopal episodes due to vasovagal, orthostatic hypotension, dehydration  . History of TIAs    several since 2002  . Hyperlipidemia   . Hypertension   . Mild atherosclerosis of carotid artery, bilateral    per last duplex 11-04-2014   bilateral ICA 1--39%  . Neuropathy    fingers  . OSA on CPAP    followed by dr dohmeier--  sev. osa w/ AHI 65.9  . Prostate cancer ([place]) dx 2018  . Renal insufficiency   . S/P coronary artery stent placement 2010   stenting to mid LAD  . Simple renal cyst    bilaterally  . Trigger finger of both hands 11-17-13  . Type 2 diabetes mellitus ([place]) dx 1986   last one A1c 9.2 on 04-26-2016  . Unsteady gait    . Hx prior CVA/TIAs;  . Vertebral artery occlusion, left    chronic    Past Surgical History:  Procedure Laterality Date  . ANTERIOR CERVICAL DECOMP/DISCECTOMY FUSION  2004   C3 -- C6 limited rom  . CARDIAC CATHETERIZATION  06-10-2010   dr Irish Lack   wide patent LAD stent, mid lesion at the origin of the septal prior to the previous stent 40-50%/  normal LVF, ef 55%  . CARDIOVASCULAR STRESS TEST  10-23-2012  dr Irish Lack   normal nuclear perfusion study w/ no ischemia/  normal LV function and wall motion , ef 65%  . CARPAL TUNNEL RELEASE Bilateral   . CATARACT EXTRACTION W/ INTRAOCULAR LENS  IMPLANT, BILATERAL    . CHOLECYSTECTOMY N/A 11/02/2015   Procedure: LAPAROSCOPIC  CHOLECYSTECTOMY WITH INTRAOPERATIVE CHOLANGIOGRAM;  Surgeon: Donnie Mesa, MD;  Location: [facility];  Service: General;  Laterality: N/A;  . COLONOSCOPY    . CORONARY ANGIOPLASTY WITH STENT PLACEMENT  02/2008   stenting to mid LAD  . GOLD SEED IMPLANT N/A 11/15/2016   Procedure: GOLD SEED IMPLANT TIMES THREE;  Surgeon: Ardis Hughs, MD;  Location[facility];  Service: Urology;  Laterality: N/A;  . LEFT HEART CATH AND CORONARY ANGIOGRAPHY N/A 05/25/2017   Procedure: LEFT HEART CATH AND CORONARY ANGIOGRAPHY;  Surgeon: Jettie Booze, MD;  Location: [facility] CV [facility];  Service: Cardiovascular;  Laterality: N/A;  . LEFT HEART CATHETERIZATION WITH CORONARY ANGIOGRAM N/A 04/03/2013   Procedure: LEFT HEART CATHETERIZATION WITH CORONARY ANGIOGRAM;  Surgeon: Jettie Booze, MD;  Location: [facility]  CATH LAB;  Service: Cardiovascular;  Laterality: N/A;  patent mLAD stent  w/ mild disease in remainder LAD and its branches;  mod. focal lesion midLCFx- FFR of lesion was negative for ischemia/  normal LVSF, ef 50%  . lungs  2005   "fluid pumped off lungs"  . NEUROPLASTY / TRANSPOSITION ULNAR NERVE AT ELBOW Right 2004  . PROSTATE BIOPSY N/A 08/31/2016   Procedure: PROSTATE  BIOPSY TRANSRECTAL ULTRASONIC PROSTATE (TUBP);  Surgeon: Ardis Hughs, MD;  Location: [facility];  Service: Urology;  Laterality: N/A;  . SPACE OAR INSTILLATION N/A 11/15/2016   Procedure: SPACE OAR INSTILLATION;  Surgeon: Ardis Hughs, MD;  Location: [facility];  Service: Urology;  Laterality: N/A;  . TRANSTHORACIC ECHOCARDIOGRAM  04/27/2016   severe focal basal LVH, ef 60-65%,  grade 2 diastoilc dysfunction/  mild AR, MR, and TR/  atrial septum lipomatous hypertrophy/  PASP 14mmHg  . UMBILICAL HERNIA REPAIR      There were no vitals filed for this visit.  Subjective Assessment - 09/26/17 1019    Subjective  Pt states he continues to have increased pain on left side of body since having TIA on 09-09-17    Pertinent History  h/o CVA's - 2002 and 2003; h/o TIA's; vertigo due to cerebrovascular disease and as late effect of CVA (per referral); lumbar DDD (radiculopathy?) pt reports LBP with standing; Lt heart catherization 05-25-17; DOE    Patient Stated Goals  "Try to minimize the situation - help with balance and vertigo"                       [facility] Adult PT Treatment/Exercise - 09/26/17 0001      Ambulation/Gait   Ambulation/Gait  Yes    Ambulation/Gait Assistance  5: Supervision   2# weight used on LLE during gait training   Ambulation Distance (Feet)  300 Feet    Assistive device  None    Gait Pattern  Within Functional Limits    Ambulation Surface  Level;Indoor    Gait Comments  pt reported use of 2# weight on LLE felt good during gait training       Exercises   Exercises  Knee/Hip;Ankle      Knee/Hip Exercises: Stretches   Active Hamstring Stretch  Left;1 rep;30 seconds    Gastroc Stretch  Left;1 rep;30 seconds   use of 4" step     Knee/Hip Exercises: Aerobic   Nustep  Level 4 x 10" with UE's and LE's   NO CHARGE as unsupervised (after session ended)      Knee/Hip Exercises: Standing   Heel Raises  Both;1 set;10 reps  Forward Step Up  Left;1 set;10 reps;Hand Hold: 0;Step Height: 6"    Step Down  Left;1 set;Hand Hold: 2;10 reps;Step Height: 6"      Shoulder Exercises: Standing   Other Standing Exercises  Pt performed stepping over and back of balance beam with LLE with use of 2# weight for strengthening x 10 reps with UE support prn          Balance Exercises - 09/26/17 1027      Balance Exercises: Standing   Standing Eyes Closed  Wide (BOA);Foam/compliant surface;Head turns;5 reps   standing on rockerboard inside // bars w/UE support   Rockerboard  Anterior/posterior;Lateral;Head turns;EO;10 reps;Intermittent UE support   insdie // bars   Other Standing Exercises  Pt performed amb. forwards and backwards on tiptoes along counter for UE support prn x 2 reps each with CGA - for improved dynamic standing balance and for gastroc strengthening             PT Long Term Goals - 08/29/17 2125      PT LONG TERM GOAL #1   Title  Improve TUG score from 15.81 secs to </= 13.5 secs without device to decr. fall risk.    Baseline  15.81 secs on 08-28-17    Time  4    Period  Weeks    Status  New    Target Date  09/28/17      PT LONG TERM GOAL #2   Title  Increase gait velocity to >/= 2.7 ft/sec without device for incr. gait efficiency.      Baseline  2.42 ft/sec = 13.57 secs    Time  4    Period  Weeks    Status  New    Target Date  09/28/17      PT LONG TERM GOAL #3   Title  Increase distance in 3" walk test by at least 75' to demo improved endurance.    Baseline  3" walk test to be completed     Time  4     Status  New    Target Date  09/28/17      PT LONG TERM GOAL #4   Title  Independent in HEP for balance exs.    Time  4    Period  Weeks    Status  New    Target Date  09/28/17            Plan - 09/26/17 1030    Clinical Impression Statement  Pt tolerated strengthening exercises well with use of 2# weight on LLE for strengthening and incr. proprioceptive input; pt stated use of weight felt "really good" and pt demonstrated ability to lift LLE higher with increased Lt hip and knee flexion in swing phase noted, especially with pt focusing on movement.  POC to be extended as pt has only attended 6/9 treatment sessions to date due to 1 missed appt and lack of schedule availability.      Rehab Potential  Good    PT Frequency  2x / week    PT Duration  4 weeks    PT Treatment/Interventions  ADLs/Self Care Home Management;DME Instruction;Stair training;Gait training;Therapeutic activities;Therapeutic exercise;Balance training;Neuromuscular re-education;Patient/family education;Vestibular    PT Next Visit Plan  continue strengthening exercises for LLE with use of 2# weight; cont balance/vestibular/dynamic gait activities;  please have pt add on 2 more appts for completion of PT program    PT Home Exercise Plan  see above    Consulted  and Agree with Plan of Care  Patient       Patient will benefit from skilled therapeutic intervention in order to improve the following deficits and impairments:  Cardiopulmonary status limiting activity, Decreased activity tolerance, Decreased balance, Dizziness, Pain, Abnormal gait, Decreased strength, Decreased endurance  Visit Diagnosis: Unsteadiness on feet  Other abnormalities of gait and mobility     Problem List Patient Active Problem List   Diagnosis Date Noted  . Radiation therapy complication 70/78/6754  . Primary prostate cancer ([place]) 07/31/2017  . Anemia 07/31/2017  . Vertigo due to cerebrovascular disease 07/31/2017  . Poor compliance  with CPAP treatment 07/31/2017  . Absolute anemia 05/16/2017  . Avitaminosis D 05/16/2017  . Benign essential HTN 05/16/2017  . Benign prostatic hypertrophy without urinary obstruction 05/16/2017  . Clinical depression 05/16/2017  . CN (constipation) 05/16/2017  . Current drug use 05/16/2017  . Diabetic neuropathy ([place]) 05/16/2017  . Genital herpes 05/16/2017  . Infarction of lung due to iatrogenic pulmonary embolism ([place]) 05/16/2017  . Peripheral neuralgia 05/16/2017  . Arteriosclerosis of coronary artery 05/16/2017  . Artery disease, cerebral 05/16/2017  . Apnea, sleep 05/16/2017  . Arthralgia of hip or thigh 05/16/2017  . Malignant neoplasm of prostate ([place]) 09/28/2016  . Vascular dementia in remission 09/28/2016  . Remote history of stroke 09/28/2016  . Acute kidney injury ([place]) 05/18/2016  . Dehydration 05/18/2016  . Near syncope 05/18/2016  . Orthostatic hypotension 05/18/2016  . CKD (chronic kidney disease), stage II 04/26/2016  . UTI (urinary tract infection) 04/26/2016  . Encounter for counseling on use of CPAP 11/18/2015  . Chronic cholecystitis with calculus 11/02/2015  . 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 TIA (transient ischemic attack) 11/03/2014  . OSA on CPAP 05/18/2014  . White matter disease 08/14/2013  . OSA (obstructive sleep apnea) 08/14/2013  . Abnormal x-ray of temporomandibular joint 05/30/2013  . Chronic infection of sinus 05/30/2013  . Cough 05/30/2013  . Fatigue 05/30/2013  . Unsteady gait 05/19/2013  . Combined fat and carbohydrate induced hyperlipemia 04/24/2013  . Syncope 03/20/2013  . Angina pectoris ([place]) 10/08/2012  . Hypertension   . Stroke ([place])   . History of TIAs   . Lumbago   . Other and unspecified hyperlipidemia   . Personal history of unspecified circulatory disease   . Unspecified fall   . Pain in joint, multiple sites   . Degeneration of cervical intervertebral disc   . Unspecified cardiovascular disease   . History  of pulmonary embolism: June 2014,  Takes Xarelto 07/01/2012    Class: History of  . Hemiparesis ([place]) 07/18/2011  . Diabetes mellitus type 2 with complications ([place]) 49/20/1007  . HTN (hypertension) 07/18/2011  . CAD in native artery 07/18/2011  . History of recurrent TIAs 07/18/2011    Alda Lea, PT 09/26/2017, 10:56 AM  [facility] [address] [place], [place], [address] Phone: [phone]   Fax:  [phone]  Name: Steve Brown MRN: [id number] Date of Birth: 04-04-1940

## 2017-09-28 ENCOUNTER — Encounter: Payer: Self-pay | Admitting: Physical Therapy

## 2017-09-28 ENCOUNTER — Ambulatory Visit: Payer: Medicare Other | Admitting: Physical Therapy

## 2017-09-28 DIAGNOSIS — R2689 Other abnormalities of gait and mobility: Secondary | ICD-10-CM | POA: Diagnosis not present

## 2017-09-28 DIAGNOSIS — R42 Dizziness and giddiness: Secondary | ICD-10-CM | POA: Diagnosis not present

## 2017-09-28 DIAGNOSIS — R2681 Unsteadiness on feet: Secondary | ICD-10-CM | POA: Diagnosis not present

## 2017-09-28 NOTE — Therapy (Signed)
[facility] [address] [place], [place], [address] Phone: [phone]   Fax:  [phone]  Physical Therapy Treatment  Patient Details  Name: Steve Brown MRN: [id number] Date of Birth: March 25, 1940 Referring Provider: Asencion Partridge Dohmeier   Encounter Date: 09/28/2017  PT End of Session - 09/27/17 2122    Visit Number  6    Number of Visits  9    Date for PT Re-Evaluation  10/05/17   extended POC by 1 week due to missed appts due to lack of schedule availability   Authorization Type  Medicare and [employer]    Authorization Time Period  08-28-17 - 11-26-17    PT Start Time  1320    PT Stop Time  1402    PT Time Calculation (min)  42 min    Activity Tolerance  Patient tolerated treatment well    Behavior During Therapy  [facility] for tasks assessed/performed       Past Medical History:  Diagnosis Date  . Abnormal prostate biopsy   . Anticoagulant long-term use    currently xarelto  . BPH with elevated PSA   . CKD (chronic kidney disease), stage II   . Complication of anesthesia    limted neck rom limited use of left arm due to cva  . Coronary artery disease    CARDIOLOGIST-  DR Irish Lack--  2010-- PCI w/ stenting midLAD  . DDD (degenerative disc disease), lumbar   . Degeneration of cervical intervertebral disc   . Depression   . Dyspnea on exertion   . GERD (gastroesophageal reflux disease)   . Hemiparesis due to cerebral infarction   . History of cerebrovascular accident (CVA) with residual deficit 2002 and 2003--  hemiparisis both sides   per MRI  anterior left frontal lobe, left para midline pons, and inferior cerebullam bilaterally infarcts  . History of pulmonary embolus (PE)    06-30-2012  extensive bilaterally  . History of recurrent TIAs   . History of syncope    hx multiple pre-syncope and syncopal episodes due to vasovagal, orthostatic hypotension, dehydration  . History of TIAs    several since 2002  .  Hyperlipidemia   . Hypertension   . Mild atherosclerosis of carotid artery, bilateral    per last duplex 11-04-2014  bilateral ICA 1--39%  . Neuropathy    fingers  . OSA on CPAP    followed by dr dohmeier--  sev. osa w/ AHI 65.9  . Prostate cancer ([place]) dx 2018  . Renal insufficiency   . S/P coronary artery stent placement 2010   stenting to mid LAD  . Simple renal cyst    bilaterally  . Trigger finger of both hands 11-17-13  . Type 2 diabetes mellitus ([place]) dx 1986   last one A1c 9.2 on 04-26-2016  . Unsteady gait    . Hx prior CVA/TIAs;  . Vertebral artery occlusion, left    chronic    Past Surgical History:  Procedure Laterality Date  . ANTERIOR CERVICAL DECOMP/DISCECTOMY FUSION  2004   C3 -- C6 limited rom  . CARDIAC CATHETERIZATION  06-10-2010   dr Irish Lack   wide patent LAD stent, mid lesion at the origin of the septal prior to the previous stent 40-50%/  normal LVF, ef 55%  . CARDIOVASCULAR STRESS TEST  10-23-2012  dr Irish Lack   normal nuclear perfusion study w/ no ischemia/  normal LV function and wall motion , ef 65%  . CARPAL TUNNEL RELEASE Bilateral   .  CATARACT EXTRACTION W/ INTRAOCULAR LENS  IMPLANT, BILATERAL    . CHOLECYSTECTOMY N/A 11/02/2015   Procedure: LAPAROSCOPIC CHOLECYSTECTOMY WITH INTRAOPERATIVE CHOLANGIOGRAM;  Surgeon: Donnie Mesa, MD;  Location: [facility];  Service: General;  Laterality: N/A;  . COLONOSCOPY    . CORONARY ANGIOPLASTY WITH STENT PLACEMENT  02/2008   stenting to mid LAD  . GOLD SEED IMPLANT N/A 11/15/2016   Procedure: GOLD SEED IMPLANT TIMES THREE;  Surgeon: Ardis Hughs, MD;  Location: [facility];  Service: Urology;  Laterality: N/A;  . LEFT HEART CATH AND CORONARY ANGIOGRAPHY N/A 05/25/2017   Procedure: LEFT HEART CATH AND CORONARY ANGIOGRAPHY;  Surgeon: Jettie Booze, MD;  Location: [facility];  Service: Cardiovascular;  Laterality: N/A;  . LEFT HEART CATHETERIZATION WITH CORONARY ANGIOGRAM N/A  04/03/2013   Procedure: LEFT HEART CATHETERIZATION WITH CORONARY ANGIOGRAM;  Surgeon: Jettie Booze, MD;  Location: [facility] CATH LAB;  Service: Cardiovascular;  Laterality: N/A;  patent mLAD stent  w/ mild disease in remainder LAD and its branches;  mod. focal lesion midLCFx- FFR of lesion was negative for ischemia/  normal LVSF, ef 50%  . lungs  2005   "fluid pumped off lungs"  . NEUROPLASTY / TRANSPOSITION ULNAR NERVE AT ELBOW Right 2004  . PROSTATE BIOPSY N/A 08/31/2016   Procedure: PROSTATE  BIOPSY TRANSRECTAL ULTRASONIC PROSTATE (TUBP);  Surgeon: Ardis Hughs, MD;  Location: [facility];  Service: Urology;  Laterality: N/A;  . SPACE OAR INSTILLATION N/A 11/15/2016   Procedure: SPACE OAR INSTILLATION;  Surgeon: Ardis Hughs, MD;  Location: [facility];  Service: Urology;  Laterality: N/A;  . TRANSTHORACIC ECHOCARDIOGRAM  04/27/2016   severe focal basal LVH, ef 60-65%,  grade 2 diastoilc dysfunction/  mild AR, MR, and TR/  atrial septum lipomatous hypertrophy/  PASP 75mmHg  . UMBILICAL HERNIA REPAIR      There were no vitals filed for this visit.  Subjective Assessment - 09/28/17 1324    Subjective  Only issue is the dizziness and I don't know if it will ever get better. If I move my eyes all the way to the right, it makes things "go crazy" and I feel like I'm going to fall. Reports he has symptoms of dizziness when doing VORx1 exercise for HEP.     Pertinent History  h/o CVA's - 2002 and 2003; h/o TIA's; vertigo due to cerebrovascular disease and as late effect of CVA (per referral); lumbar DDD (radiculopathy?) pt reports LBP with standing; Lt heart catherization 05-25-17; DOE    Patient Stated Goals  "Try to minimize the situation - help with balance and vertigo"    Currently in Pain?  No/denies                       [facility] Adult PT Treatment/Exercise - 09/28/17 1400      Ambulation/Gait   Ambulation/Gait Assistance  5:  Supervision   use of 2# weight on LLE   Ambulation Distance (Feet)  300 Feet    Assistive device  None    Gait Pattern  Within Functional Limits    Ambulation Surface  Level;Indoor    Gait Comments  states he plans to purchase an ankle weight to use at [facility]      Lumbar Exercises: Quadruped   Straight Leg Raise  5 reps   each LE x 2 sets     Knee/Hip Exercises: Machines for Strengthening   Total Gym Leg  Press  70# x 20 reps bil; then each leg individually x 20 reps.           Balance Exercises - 09/28/17 2114      Balance Exercises: Standing   Rockerboard  Anterior/posterior;EO;EC   hip strategy/bow and recover x 10 reps   Balance Beam  step over and back with 2# weight on LLE    Other Standing Exercises  --   alternating heel taps on 6" step x 20 reps            PT Long Term Goals - 08/29/17 2125      PT LONG TERM GOAL #1   Title  Improve TUG score from 15.81 secs to </= 13.5 secs without device to decr. fall risk.    Baseline  15.81 secs on 08-28-17    Time  4    Period  Weeks    Status  New    Target Date  09/28/17      PT LONG TERM GOAL #2   Title  Increase gait velocity to >/= 2.7 ft/sec without device for incr. gait efficiency.      Baseline  2.42 ft/sec = 13.57 secs    Time  4    Period  Weeks    Status  New    Target Date  09/28/17      PT LONG TERM GOAL #3   Title  Increase distance in 3" walk test by at least 75' to demo improved endurance.    Baseline  3" walk test to be completed     Time  4    Status  New    Target Date  09/28/17      PT LONG TERM GOAL #4   Title  Independent in HEP for balance exs.    Time  4    Period  Weeks    Status  New    Target Date  09/28/17            Plan - 09/28/17 2122    Clinical Impression Statement  Continued focus on strengtheing bil LEs. Patient agreed to adding appointments due to lack of schedule availabiltiy when initially scheduling his appointnments.     Rehab Potential  Good    PT  Frequency  2x / week    PT Duration  4 weeks    PT Treatment/Interventions  ADLs/Self Care Home Management;DME Instruction;Stair training;Gait training;Therapeutic activities;Therapeutic exercise;Balance training;Neuromuscular re-education;Patient/family education;Vestibular    PT Next Visit Plan  continue strengthening exercises for LLE with use of 2# weight; cont balance/vestibular/dynamic gait activities;      PT Home Exercise Plan  see above    Consulted and Agree with Plan of Care  Patient       Patient will benefit from skilled therapeutic intervention in order to improve the following deficits and impairments:  Cardiopulmonary status limiting activity, Decreased activity tolerance, Decreased balance, Dizziness, Pain, Abnormal gait, Decreased strength, Decreased endurance  Visit Diagnosis: Other abnormalities of gait and mobility     Problem List Patient Active Problem List   Diagnosis Date Noted  . Radiation therapy complication 09/81/1914  . Primary prostate cancer ([place]) 07/31/2017  . Anemia 07/31/2017  . Vertigo due to cerebrovascular disease 07/31/2017  . Poor compliance with CPAP treatment 07/31/2017  . Absolute anemia 05/16/2017  . Avitaminosis D 05/16/2017  . Benign essential HTN 05/16/2017  . Benign prostatic hypertrophy without urinary obstruction 05/16/2017  . Clinical depression 05/16/2017  . CN (constipation) 05/16/2017  .  Current drug use 05/16/2017  . Diabetic neuropathy ([place]) 05/16/2017  . Genital herpes 05/16/2017  . Infarction of lung due to iatrogenic pulmonary embolism ([employer]) 05/16/2017  . Peripheral neuralgia 05/16/2017  . Arteriosclerosis of coronary artery 05/16/2017  . Artery disease, cerebral 05/16/2017  . Apnea, sleep 05/16/2017  . Arthralgia of hip or thigh 05/16/2017  . Malignant neoplasm of prostate ([place]) 09/28/2016  . Vascular dementia in remission 09/28/2016  . Remote history of stroke 09/28/2016  . Acute kidney injury ([place]) 05/18/2016  .  Dehydration 05/18/2016  . Near syncope 05/18/2016  . Orthostatic hypotension 05/18/2016  . CKD (chronic kidney disease), stage II 04/26/2016  . UTI (urinary tract infection) 04/26/2016  . Encounter for counseling on use of CPAP 11/18/2015  . Chronic cholecystitis with calculus 11/02/2015  . 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 TIA (transient ischemic attack) 11/03/2014  . OSA on CPAP 05/18/2014  . White matter disease 08/14/2013  . OSA (obstructive sleep apnea) 08/14/2013  . Abnormal x-ray of temporomandibular joint 05/30/2013  . Chronic infection of sinus 05/30/2013  . Cough 05/30/2013  . Fatigue 05/30/2013  . Unsteady gait 05/19/2013  . Combined fat and carbohydrate induced hyperlipemia 04/24/2013  . Syncope 03/20/2013  . Angina pectoris ([place]) 10/08/2012  . Hypertension   . Stroke ([place])   . History of TIAs   . Lumbago   . Other and unspecified hyperlipidemia   . Personal history of unspecified circulatory disease   . Unspecified fall   . Pain in joint, multiple sites   . Degeneration of cervical intervertebral disc   . Unspecified cardiovascular disease   . History of pulmonary embolism: June 2014,  Takes Xarelto 07/01/2012    Class: History of  . Hemiparesis ([place]) 07/18/2011  . Diabetes mellitus type 2 with complications ([place]) 44/96/7591  . HTN (hypertension) 07/18/2011  . CAD in native artery 07/18/2011  . History of recurrent TIAs 07/18/2011    Rexanne Mano, PT 09/28/2017, 9:29 PM  [facility] [address] [place], [place], [address] Phone: [phone]   Fax:  [phone]  Name: Shuaib Corsino MRN: [id number] Date of Birth: 08/07/1940

## 2017-10-02 DIAGNOSIS — E1165 Type 2 diabetes mellitus with hyperglycemia: Secondary | ICD-10-CM | POA: Diagnosis not present

## 2017-10-05 ENCOUNTER — Ambulatory Visit (INDEPENDENT_AMBULATORY_CARE_PROVIDER_SITE_OTHER): Payer: Medicare Other | Admitting: Vascular Surgery

## 2017-10-05 ENCOUNTER — Encounter

## 2017-10-05 ENCOUNTER — Other Ambulatory Visit: Payer: Self-pay

## 2017-10-05 ENCOUNTER — Encounter: Payer: Self-pay | Admitting: Vascular Surgery

## 2017-10-05 VITALS — BP 117/65 | HR 67 | Resp 19 | Ht 65.0 in | Wt 182.0 lb

## 2017-10-05 DIAGNOSIS — I6502 Occlusion and stenosis of left vertebral artery: Secondary | ICD-10-CM

## 2017-10-05 NOTE — Progress Notes (Signed)
Patient ID: Steve Brown, male   DOB: 09/28/1940, 77 y.o.   MRN: [id number]  Reason for Consult: New Patient (Initial Visit) (eval occluded veteberal - Dr. Harrington Challenger)   Referred by Lawerance Cruel, MD  Subjective:     HPI:  Steve Brown is a 77 y.o. male with a history of bilateral strokes.  He is also had vertigo and is undergone work-up.  He has a known left vertebral artery occlusion for which she is now sent for evaluation.  This was recently confirmed with duplex.  He does take Xarelto and a statin drug.  He is not on any antiplatelets.  He does not know when he occluded his vertebral artery.  He is got no lower extremity symptoms.  He remains active working out the wife.  History of coronary artery stenting in 2010.  No previous lower extremity interventions.  Never a smoker risk factors for vascular disease include hypertension hyperlipidemia  Past Medical History:  Diagnosis Date  . Abnormal prostate biopsy   . Anticoagulant long-term use    currently xarelto  . BPH with elevated PSA   . CKD (chronic kidney disease), stage II   . Complication of anesthesia    limted neck rom limited use of left arm due to cva  . Coronary artery disease    CARDIOLOGIST-  DR Irish Lack--  2010-- PCI w/ stenting midLAD  . DDD (degenerative disc disease), lumbar   . Degeneration of cervical intervertebral disc   . Depression   . Dyspnea on exertion   . GERD (gastroesophageal reflux disease)   . Hemiparesis due to cerebral infarction   . History of cerebrovascular accident (CVA) with residual deficit 2002 and 2003--  hemiparisis both sides   per MRI  anterior left frontal lobe, left para midline pons, and inferior cerebullam bilaterally infarcts  . History of pulmonary embolus (PE)    06-30-2012  extensive bilaterally  . History of recurrent TIAs   . History of syncope    hx multiple pre-syncope and syncopal episodes due to vasovagal, orthostatic hypotension, dehydration  . History of  TIAs    several since 2002  . Hyperlipidemia   . Hypertension   . Mild atherosclerosis of carotid artery, bilateral    per last duplex 11-04-2014  bilateral ICA 1--39%  . Neuropathy    fingers  . OSA on CPAP    followed by dr dohmeier--  sev. osa w/ AHI 65.9  . Prostate cancer ([place]) dx 2018  . Renal insufficiency   . S/P coronary artery stent placement 2010   stenting to mid LAD  . Simple renal cyst    bilaterally  . Stroke ([place])   . Trigger finger of both hands 11-17-13  . Type 2 diabetes mellitus ([place]) dx 1986   last one A1c 9.2 on 04-26-2016  . Unsteady gait    . Hx prior CVA/TIAs;  . Vertebral artery occlusion, left    chronic   Family History  Problem Relation Age of Onset  . Aneurysm Mother   . Cancer Father        unknown either pancreatic or prostate  . Heart attack Neg Hx    Past Surgical History:  Procedure Laterality Date  . ANTERIOR CERVICAL DECOMP/DISCECTOMY FUSION  2004   C3 -- C6 limited rom  . CARDIAC CATHETERIZATION  06-10-2010   dr Irish Lack   wide patent LAD stent, mid lesion at the origin of the septal prior to the previous stent 40-50%/  normal LVF,  ef 55%  . CARDIOVASCULAR STRESS TEST  10-23-2012  dr Irish Lack   normal nuclear perfusion study w/ no ischemia/  normal LV function and wall motion , ef 65%  . CARPAL TUNNEL RELEASE Bilateral   . CATARACT EXTRACTION W/ INTRAOCULAR LENS  IMPLANT, BILATERAL    . CHOLECYSTECTOMY N/A 11/02/2015   Procedure: LAPAROSCOPIC CHOLECYSTECTOMY WITH INTRAOPERATIVE CHOLANGIOGRAM;  Surgeon: Donnie Mesa, MD;  Location: [facility];  Service: General;  Laterality: N/A;  . COLONOSCOPY    . CORONARY ANGIOPLASTY WITH STENT PLACEMENT  02/2008   stenting to mid LAD  . GOLD SEED IMPLANT N/A 11/15/2016   Procedure: GOLD SEED IMPLANT TIMES THREE;  Surgeon: Ardis Hughs, MD;  Location: [facility];  Service: Urology;  Laterality: N/A;  . LEFT HEART CATH AND CORONARY ANGIOGRAPHY N/A 05/25/2017   Procedure: LEFT  HEART CATH AND CORONARY ANGIOGRAPHY;  Surgeon: Jettie Booze, MD;  Location: [facility];  Service: Cardiovascular;  Laterality: N/A;  . LEFT HEART CATHETERIZATION WITH CORONARY ANGIOGRAM N/A 04/03/2013   Procedure: LEFT HEART CATHETERIZATION WITH CORONARY ANGIOGRAM;  Surgeon: Jettie Booze, MD;  Location: [facility] CATH LAB;  Service: Cardiovascular;  Laterality: N/A;  patent mLAD stent  w/ mild disease in remainder LAD and its branches;  mod. focal lesion midLCFx- FFR of lesion was negative for ischemia/  normal LVSF, ef 50%  . lungs  2005   "fluid pumped off lungs"  . NEUROPLASTY / TRANSPOSITION ULNAR NERVE AT ELBOW Right 2004  . PROSTATE BIOPSY N/A 08/31/2016   Procedure: PROSTATE  BIOPSY TRANSRECTAL ULTRASONIC PROSTATE (TUBP);  Surgeon: Ardis Hughs, MD;  Location: [facility];  Service: Urology;  Laterality: N/A;  . SPACE OAR INSTILLATION N/A 11/15/2016   Procedure: SPACE OAR INSTILLATION;  Surgeon: Ardis Hughs, MD;  Location: [facility];  Service: Urology;  Laterality: N/A;  . TRANSTHORACIC ECHOCARDIOGRAM  04/27/2016   severe focal basal LVH, ef 60-65%,  grade 2 diastoilc dysfunction/  mild AR, MR, and TR/  atrial septum lipomatous hypertrophy/  PASP 66mmHg  . UMBILICAL HERNIA REPAIR      Short Social History:  Social History   Tobacco Use  . Smoking status: Never Smoker  . Smokeless tobacco: Never Used  Substance Use Topics  . Alcohol use: No    Alcohol/week: 0.0 standard drinks    Allergies  Allergen Reactions  . Other Other (See Comments)    Per patient- cardiac cath dye-  "woke up during procedure hysterical."  . Phenergan [Promethazine] Other (See Comments)    Mood changes     Current Outpatient Medications  Medication Sig Dispense Refill  . acetaminophen (TYLENOL) 500 MG tablet Take 1,000 mg by mouth every 6 (six) hours as needed (pain).     Marland Kitchen albuterol (PROVENTIL HFA;VENTOLIN HFA) 108 (90 Base) MCG/ACT inhaler  Inhale 2 puffs into the lungs every 6 (six) hours as needed for wheezing or shortness of breath.    . Ascorbic Acid (VITAMIN C PO) Take 1 tablet by mouth daily.    . BD PEN NEEDLE NANO U/F 32G X 4 MM MISC by Does not apply route.     . bisacodyl (DULCOLAX) 5 MG EC tablet Take 5 mg by mouth daily as needed for moderate constipation.    . cholecalciferol (VITAMIN D) 1000 UNITS tablet Take 1,000 Units by mouth daily.    . diclofenac sodium (VOLTAREN) 1 % GEL Apply 1 application topically See admin instructions. Apply topically once daily ,  may use an additional time as needed for pain  1  . Echinacea 450 MG CAPS Take 450 mg by mouth daily.     . fluticasone (FLONASE) 50 MCG/ACT nasal spray Place 1 spray into both nostrils 2 (two) times daily.   0  . furosemide (LASIX) 20 MG tablet Take 20 mg by mouth daily as needed (leg and ankle swelling).     . Insulin Glargine (TOUJEO SOLOSTAR) 300 UNIT/ML SOPN Inject 20-25 Units into the skin daily before breakfast.     . insulin lispro (HUMALOG KWIKPEN) 100 UNIT/ML KiwkPen Inject 6-10 Units into the skin 3 (three) times daily with meals.    . IRON PO Take 1 tablet by mouth daily.    . meclizine (ANTIVERT) 25 MG tablet Take 12.5-25 mg by mouth 3 (three) times daily as needed for dizziness.    . metFORMIN (GLUCOPHAGE) 500 MG tablet Take 500 mg by mouth 2 (two) times daily as needed (CBG >200).     . Multiple Vitamin (MULTIVITAMIN WITH MINERALS) TABS tablet Take 1 tablet by mouth daily.    . nitroGLYCERIN (NITROSTAT) 0.4 MG SL tablet Place 1 tablet (0.4 mg total) under the tongue every 5 (five) minutes as needed for chest pain. 25 tablet 12  . pantoprazole (PROTONIX) 40 MG tablet Take 40 mg by mouth daily as needed (acid reflux/heartburn).     . pregabalin (LYRICA) 300 MG capsule Take 1 capsule (300 mg total) by mouth 2 (two) times daily. 180 capsule 3  . rivaroxaban (XARELTO) 20 MG TABS tablet Take 1 tablet (20 mg total) by mouth daily. Resume in 72 hours after  procedure. (Patient taking differently: Take 20 mg by mouth daily with breakfast. Resume in 72 hours after procedure.) 30 tablet   . rosuvastatin (CRESTOR) 10 MG tablet Take 10 mg by mouth at bedtime.     . tamsulosin (FLOMAX) 0.4 MG CAPS capsule Take 0.4 mg by mouth daily.    . valACYclovir (VALTREX) 500 MG tablet Take 500 mg by mouth daily.     No current facility-administered medications for this visit.     Review of Systems  Constitutional:  Constitutional negative. HENT: HENT negative.  Eyes: Eyes negative.  Respiratory: Positive for shortness of breath and wheezing.  Cardiovascular: Positive for chest pain.  GI: Gastrointestinal negative.  Skin: Skin negative.  Neurological: Positive for dizziness, focal weakness and light-headedness.  Hematologic: Hematologic/lymphatic negative.  Psychiatric: Psychiatric negative.        Objective:  Objective   Vitals:   10/05/17 1406  BP: 117/65  Pulse: 67  Resp: 19  SpO2: 96%  Weight: 182 lb (82.6 kg)  Height: 5\' 5"  (1.651 m)   Body mass index is 30.29 kg/m.  Physical Exam  Constitutional: He is oriented to person, place, and time. He appears well-developed.  HENT:  Head: Normocephalic.  Eyes: Pupils are equal, round, and reactive to light.  Neck: Normal range of motion. Neck supple.  Cardiovascular:  Pulses:      Carotid pulses are 2+ on the right side, and 2+ on the left side.      Radial pulses are 2+ on the right side, and 2+ on the left side.       Popliteal pulses are 2+ on the right side, and 2+ on the left side.       Dorsalis pedis pulses are 2+ on the right side, and 2+ on the left side.       Posterior tibial pulses are 2+  on the right side, and 2+ on the left side.  Pulmonary/Chest: Effort normal.  Abdominal: Soft. He exhibits no mass.  Neurological: He is alert and oriented to person, place, and time.  Skin: Skin is warm and dry.  Psychiatric: He has a normal mood and affect. His behavior is normal. Judgment  and thought content normal.    Data: I reviewed his previous carotid duplex which demonstrates less than 50% stenosis in his bilateral internal carotid arteries with right vertebral artery dominant left vertebral artery nonvisualized.  I reviewed his previous CT Angio of his head and neck with the patient demonstrating the blockage from the vertebral artery which is a long segment from just after the takeoff to just prior to the basilar artery.     Assessment/Plan:     77 year old male with history of strokes and now vertigo with a known occluded left vertebral artery of unknown etiology or timeframe.  Given the long segment occlusion as well as the low risk of having an occluded left vertebral artery I would not recommend any intervention.  We will have to make sure that his other vertebral artery remains patent along with the subclavian on that side and his carotid arteries as he does have ongoing risk factors.  We will see him in 1 year with repeat carotid duplex.  We reviewed his CT scan together today and he demonstrates good understanding.     Waynetta Sandy MD [facility]

## 2017-10-23 DIAGNOSIS — Z8546 Personal history of malignant neoplasm of prostate: Secondary | ICD-10-CM | POA: Diagnosis not present

## 2017-10-29 ENCOUNTER — Ambulatory Visit: Payer: Medicare Other | Attending: Neurology | Admitting: Physical Therapy

## 2017-10-29 DIAGNOSIS — R8271 Bacteriuria: Secondary | ICD-10-CM | POA: Diagnosis not present

## 2017-10-29 DIAGNOSIS — R2681 Unsteadiness on feet: Secondary | ICD-10-CM | POA: Insufficient documentation

## 2017-10-29 DIAGNOSIS — Z8546 Personal history of malignant neoplasm of prostate: Secondary | ICD-10-CM | POA: Diagnosis not present

## 2017-10-29 DIAGNOSIS — R2689 Other abnormalities of gait and mobility: Secondary | ICD-10-CM | POA: Insufficient documentation

## 2017-10-29 DIAGNOSIS — R350 Frequency of micturition: Secondary | ICD-10-CM | POA: Diagnosis not present

## 2017-10-30 ENCOUNTER — Encounter: Payer: Self-pay | Admitting: Physical Therapy

## 2017-10-30 DIAGNOSIS — R109 Unspecified abdominal pain: Secondary | ICD-10-CM | POA: Diagnosis not present

## 2017-10-30 NOTE — Therapy (Signed)
[facility] [address] [place], [place], [address] Phone: [phone]   Fax:  [phone]  Physical Therapy Treatment  Patient Details  Name: Steve Brown MRN: [id number] Date of Birth: [birth year] Referring Provider (PT): Asencion Partridge Dohmeier   Encounter Date: 10/29/2017  PT End of Session - 10/30/17 1924    Visit Number  7    Number of Visits  9    Date for PT Re-Evaluation  10/05/17    Authorization Type  Medicare and [facility]    Authorization Time Period  08-28-17 - 11-26-17    PT Start Time  7902    PT Stop Time  1403    PT Time Calculation (min)  46 min       Past Medical History:  Diagnosis Date  . Abnormal prostate biopsy   . Anticoagulant long-term use    currently xarelto  . BPH with elevated PSA   . CKD (chronic kidney disease), stage II   . Complication of anesthesia    limted neck rom limited use of left arm due to cva  . Coronary artery disease    CARDIOLOGIST-  DR Irish Lack--  2010-- PCI w/ stenting midLAD  . DDD (degenerative disc disease), lumbar   . Degeneration of cervical intervertebral disc   . Depression   . Dyspnea on exertion   . GERD (gastroesophageal reflux disease)   . Hemiparesis due to cerebral infarction   . History of cerebrovascular accident (CVA) with residual deficit 2002 and 2003--  hemiparisis both sides   per MRI  anterior left frontal lobe, left para midline pons, and inferior cerebullam bilaterally infarcts  . History of pulmonary embolus (PE)    06-30-2012  extensive bilaterally  . History of recurrent TIAs   . History of syncope    hx multiple pre-syncope and syncopal episodes due to vasovagal, orthostatic hypotension, dehydration  . History of TIAs    several since 2002  . Hyperlipidemia   . Hypertension   . Mild atherosclerosis of carotid artery, bilateral    per last duplex 11-04-2014  bilateral ICA 1--39%  . Neuropathy    fingers  . OSA on CPAP    followed by  dr dohmeier--  sev. osa w/ AHI 65.9  . Prostate cancer ([place]) dx 2018  . Renal insufficiency   . S/P coronary artery stent placement 2010   stenting to mid LAD  . Simple renal cyst    bilaterally  . Stroke ([place])   . Trigger finger of both hands 11-17-13  . Type 2 diabetes mellitus ([place]) dx 1986   last one A1c 9.2 on 04-26-2016  . Unsteady gait    . Hx prior CVA/TIAs;  . Vertebral artery occlusion, left    chronic    Past Surgical History:  Procedure Laterality Date  . ANTERIOR CERVICAL DECOMP/DISCECTOMY FUSION  2004   C3 -- C6 limited rom  . CARDIAC CATHETERIZATION  06-10-2010   dr Irish Lack   wide patent LAD stent, mid lesion at the origin of the septal prior to the previous stent 40-50%/  normal LVF, ef 55%  . CARDIOVASCULAR STRESS TEST  10-23-2012  dr Irish Lack   normal nuclear perfusion study w/ no ischemia/  normal LV function and wall motion , ef 65%  . CARPAL TUNNEL RELEASE Bilateral   . CATARACT EXTRACTION W/ INTRAOCULAR LENS  IMPLANT, BILATERAL    . CHOLECYSTECTOMY N/A 11/02/2015   Procedure: LAPAROSCOPIC CHOLECYSTECTOMY WITH INTRAOPERATIVE CHOLANGIOGRAM;  Surgeon: Donnie Mesa, MD;  Location: [facility];  Service: General;  Laterality: N/A;  . COLONOSCOPY    . CORONARY ANGIOPLASTY WITH STENT PLACEMENT  02/2008   stenting to mid LAD  . GOLD SEED IMPLANT N/A 11/15/2016   Procedure: GOLD SEED IMPLANT TIMES THREE;  Surgeon: Ardis Hughs, MD;  Location[facility];  Service: Urology;  Laterality: N/A;  . LEFT HEART CATH AND CORONARY ANGIOGRAPHY N/A 05/25/2017   Procedure: LEFT HEART CATH AND CORONARY ANGIOGRAPHY;  Surgeon: Jettie Booze, MD;  Location: [facility];  Service: Cardiovascular;  Laterality: N/A;  . LEFT HEART CATHETERIZATION WITH CORONARY ANGIOGRAM N/A 04/03/2013   Procedure: LEFT HEART CATHETERIZATION WITH CORONARY ANGIOGRAM;  Surgeon: Jettie Booze, MD;  Location: [facility] CATH LAB;  Service: Cardiovascular;  Laterality: N/A;  patent  mLAD stent  w/ mild disease in remainder LAD and its branches;  mod. focal lesion midLCFx- FFR of lesion was negative for ischemia/  normal LVSF, ef 50%  . lungs  2005   "fluid pumped off lungs"  . NEUROPLASTY / TRANSPOSITION ULNAR NERVE AT ELBOW Right 2004  . PROSTATE BIOPSY N/A 08/31/2016   Procedure: PROSTATE  BIOPSY TRANSRECTAL ULTRASONIC PROSTATE (TUBP);  Surgeon: Ardis Hughs, MD;  Location: [facility];  Service: Urology;  Laterality: N/A;  . SPACE OAR INSTILLATION N/A 11/15/2016   Procedure: SPACE OAR INSTILLATION;  Surgeon: Ardis Hughs, MD;  Location: [facility];  Service: Urology;  Laterality: N/A;  . TRANSTHORACIC ECHOCARDIOGRAM  04/27/2016   severe focal basal LVH, ef 60-65%,  grade 2 diastoilc dysfunction/  mild AR, MR, and TR/  atrial septum lipomatous hypertrophy/  PASP 66mmHg  . UMBILICAL HERNIA REPAIR      There were no vitals filed for this visit.  Subjective Assessment - 10/30/17 1917    Subjective  Pt states he is doing better; states he is walking at the [facility] and doing crunches on the ab maching for abdominal strengthening     Pertinent History  h/o CVA's - 2002 and 2003; h/o TIA's; vertigo due to cerebrovascular disease and as late effect of CVA (per referral); lumbar DDD (radiculopathy?) pt reports LBP with standing; Lt heart catherization 05-25-17; DOE    Patient Stated Goals  "Try to minimize the situation - help with balance and vertigo"                       [facility] Adult PT Treatment/Exercise - 10/30/17 0001      Knee/Hip Exercises: Stretches   Active Hamstring Stretch  Left;1 rep;30 seconds    Gastroc Stretch  Left;1 rep;30 seconds   use of 4" step     Knee/Hip Exercises: Aerobic   Recumbent Bike  SiFit level 2.0 x 5" with UE's and LE's      Knee/Hip Exercises: Machines for Strengthening   Total Gym Leg Press  80# 2 sets 10 reps:  45# LLE only 2 sets 10 reps       Knee/Hip Exercises: Standing    Heel Raises  Both;1 set;10 reps    Forward Step Up  Left;1 set;10 reps;Hand Hold: 0;Step Height: 6"    Step Down  Left;1 set;Hand Hold: 2;10 reps;Step Height: 6"          Balance Exercises - 10/30/17 1923      Balance Exercises: Standing   Rockerboard  Anterior/posterior;EO;EC   hip strategy/bow and recover x 10 reps   Other Standing Exercises  pt performed standing  on Bosu with UE support inside // bars - squats x 5 reps; moving each leg forward/back and laterally x 5 reps each for improved SLS             PT Long Term Goals - 08/29/17 2125      PT LONG TERM GOAL #1   Title  Improve TUG score from 15.81 secs to </= 13.5 secs without device to decr. fall risk.    Baseline  15.81 secs on 08-28-17    Time  4    Period  Weeks    Status  New    Target Date  09/28/17      PT LONG TERM GOAL #2   Title  Increase gait velocity to >/= 2.7 ft/sec without device for incr. gait efficiency.      Baseline  2.42 ft/sec = 13.57 secs    Time  4    Period  Weeks    Status  New    Target Date  09/28/17      PT LONG TERM GOAL #3   Title  Increase distance in 3" walk test by at least 75' to demo improved endurance.    Baseline  3" walk test to be completed     Time  4    Status  New    Target Date  09/28/17      PT LONG TERM GOAL #4   Title  Independent in HEP for balance exs.    Time  4    Period  Weeks    Status  New    Target Date  09/28/17            Plan - 10/30/17 1926    Clinical Impression Statement  Pt progressing well towards goals; pt reports that the exercises for strengthening LE's have helped to reduce his back pain overall     Rehab Potential  Good    PT Frequency  2x / week    PT Duration  4 weeks    PT Treatment/Interventions  ADLs/Self Care Home Management;DME Instruction;Stair training;Gait training;Therapeutic activities;Therapeutic exercise;Balance training;Neuromuscular re-education;Patient/family education;Vestibular    PT Next Visit Plan  check  LTG's - D/C next session    PT Home Exercise Plan  see above    Consulted and Agree with Plan of Care  Patient       Patient will benefit from skilled therapeutic intervention in order to improve the following deficits and impairments:  Cardiopulmonary status limiting activity, Decreased activity tolerance, Decreased balance, Dizziness, Pain, Abnormal gait, Decreased strength, Decreased endurance  Visit Diagnosis: Other abnormalities of gait and mobility  Unsteadiness on feet     Problem List Patient Active Problem List   Diagnosis Date Noted  . Radiation therapy complication 25/95/6387  . Primary prostate cancer ([place]) 07/31/2017  . Anemia 07/31/2017  . Vertigo due to cerebrovascular disease 07/31/2017  . Poor compliance with CPAP treatment 07/31/2017  . Absolute anemia 05/16/2017  . Avitaminosis D 05/16/2017  . Benign essential HTN 05/16/2017  . Benign prostatic hypertrophy without urinary obstruction 05/16/2017  . Clinical depression 05/16/2017  . CN (constipation) 05/16/2017  . Current drug use 05/16/2017  . Diabetic neuropathy ([place]) 05/16/2017  . Genital herpes 05/16/2017  . Infarction of lung due to iatrogenic pulmonary embolism ([place]) 05/16/2017  . Peripheral neuralgia 05/16/2017  . Arteriosclerosis of coronary artery 05/16/2017  . Artery disease, cerebral 05/16/2017  . Apnea, sleep 05/16/2017  . Arthralgia of hip or thigh 05/16/2017  . Malignant neoplasm  of prostate ([place]) 09/28/2016  . Vascular dementia in remission ([place]) 09/28/2016  . Remote history of stroke 09/28/2016  . Acute kidney injury ([place]) 05/18/2016  . Dehydration 05/18/2016  . Near syncope 05/18/2016  . Orthostatic hypotension 05/18/2016  . CKD (chronic kidney disease), stage II 04/26/2016  . UTI (urinary tract infection) 04/26/2016  . Encounter for counseling on use of CPAP 11/18/2015  . Chronic cholecystitis with calculus 11/02/2015  . Stroke, 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[place]) 04/22/2015  . TIA (transient  ischemic attack) 11/03/2014  . OSA on CPAP 05/18/2014  . White matter disease 08/14/2013  . OSA (obstructive sleep apnea) 08/14/2013  . Abnormal x-ray of temporomandibular joint 05/30/2013  . Chronic infection of sinus 05/30/2013  . Cough 05/30/2013  . Fatigue 05/30/2013  . Unsteady gait 05/19/2013  . Combined fat and carbohydrate induced hyperlipemia 04/24/2013  . Syncope 03/20/2013  . Angina pectoris ([place]) 10/08/2012  . Hypertension   . Stroke ([place])   . History of TIAs   . Lumbago   . Other and unspecified hyperlipidemia   . Personal history of unspecified circulatory disease   . Unspecified fall   . Pain in joint, multiple sites   . Degeneration of cervical intervertebral disc   . Unspecified cardiovascular disease   . History of pulmonary embolism: June 2014,  Takes Xarelto 07/01/2012    Class: History of  . Hemiparesis ([place]) 07/18/2011  . Diabetes mellitus type 2 with complications ([place]) 75/30/0511  . HTN (hypertension) 07/18/2011  . CAD in native artery 07/18/2011  . History of recurrent TIAs 07/18/2011    Alda Lea, PT 10/30/2017, 7:29 PM  [facility] [address] [place], [place], [address] Phone: [phone]   Fax:  [phone]  Name: Lannis Lichtenwalner MRN: [id number] Date of Birth: 01/28/1940

## 2017-10-31 ENCOUNTER — Ambulatory Visit: Payer: Medicare Other | Admitting: Physical Therapy

## 2017-11-01 ENCOUNTER — Ambulatory Visit: Payer: Medicare Other | Admitting: Physical Therapy

## 2017-11-01 DIAGNOSIS — R2689 Other abnormalities of gait and mobility: Secondary | ICD-10-CM | POA: Diagnosis not present

## 2017-11-01 DIAGNOSIS — R2681 Unsteadiness on feet: Secondary | ICD-10-CM | POA: Diagnosis not present

## 2017-11-02 NOTE — Therapy (Addendum)
[facility] [address] [place], [place], [address] Phone: [phone]   Fax:  [phone]  Physical Therapy Treatment  Patient Details  Name: Steve Brown MRN: [id number] Date of Birth: 1940/04/13 Referring Provider (PT): Asencion Partridge Dohmeier   Encounter Date: 11/01/2017    Past Medical History:  Diagnosis Date  . Abnormal prostate biopsy   . Anticoagulant long-term use    currently xarelto  . BPH with elevated PSA   . CKD (chronic kidney disease), stage II   . Complication of anesthesia    limted neck rom limited use of left arm due to cva  . Coronary artery disease    CARDIOLOGIST-  DR Irish Lack--  2010-- PCI w/ stenting midLAD  . DDD (degenerative disc disease), lumbar   . Degeneration of cervical intervertebral disc   . Depression   . Dyspnea on exertion   . GERD (gastroesophageal reflux disease)   . Hemiparesis due to cerebral infarction   . History of cerebrovascular accident (CVA) with residual deficit 2002 and 2003--  hemiparisis both sides   per MRI  anterior left frontal lobe, left para midline pons, and inferior cerebullam bilaterally infarcts  . History of pulmonary embolus (PE)    06-30-2012  extensive bilaterally  . History of recurrent TIAs   . History of syncope    hx multiple pre-syncope and syncopal episodes due to vasovagal, orthostatic hypotension, dehydration  . History of TIAs    several since 2002  . Hyperlipidemia   . Hypertension   . Mild atherosclerosis of carotid artery, bilateral    per last duplex 11-04-2014  bilateral ICA 1--39%  . Neuropathy    fingers  . OSA on CPAP    followed by dr dohmeier--  sev. osa w/ AHI 65.9  . Prostate cancer ([place]) dx 2018  . Renal insufficiency   . S/P coronary artery stent placement 2010   stenting to mid LAD  . Simple renal cyst    bilaterally  . Stroke ([place])   . Trigger finger of both hands 11-17-13  . Type 2 diabetes mellitus ([place]) dx  1986   last one A1c 9.2 on 04-26-2016  . Unsteady gait    . Hx prior CVA/TIAs;  . Vertebral artery occlusion, left    chronic    Past Surgical History:  Procedure Laterality Date  . ANTERIOR CERVICAL DECOMP/DISCECTOMY FUSION  2004   C3 -- C6 limited rom  . CARDIAC CATHETERIZATION  06-10-2010   dr Irish Lack   wide patent LAD stent, mid lesion at the origin of the septal prior to the previous stent 40-50%/  normal LVF, ef 55%  . CARDIOVASCULAR STRESS TEST  10-23-2012  dr Irish Lack   normal nuclear perfusion study w/ no ischemia/  normal LV function and wall motion , ef 65%  . CARPAL TUNNEL RELEASE Bilateral   . CATARACT EXTRACTION W/ INTRAOCULAR LENS  IMPLANT, BILATERAL    . CHOLECYSTECTOMY N/A 11/02/2015   Procedure: LAPAROSCOPIC CHOLECYSTECTOMY WITH INTRAOPERATIVE CHOLANGIOGRAM;  Surgeon: Donnie Mesa, MD;  Location: [facility];  Service: General;  Laterality: N/A;  . COLONOSCOPY    . CORONARY ANGIOPLASTY WITH STENT PLACEMENT  02/2008   stenting to mid LAD  . GOLD SEED IMPLANT N/A 11/15/2016   Procedure: GOLD SEED IMPLANT TIMES THREE;  Surgeon: Ardis Hughs, MD;  Location: [facility];  Service: Urology;  Laterality: N/A;  . LEFT HEART CATH AND CORONARY ANGIOGRAPHY N/A 05/25/2017   Procedure: LEFT HEART CATH AND CORONARY  ANGIOGRAPHY;  Surgeon: Jettie Booze, MD;  Location: [facility];  Service: Cardiovascular;  Laterality: N/A;  . LEFT HEART CATHETERIZATION WITH CORONARY ANGIOGRAM N/A 04/03/2013   Procedure: LEFT HEART CATHETERIZATION WITH CORONARY ANGIOGRAM;  Surgeon: Jettie Booze, MD;  Location: [facility] CATH LAB;  Service: Cardiovascular;  Laterality: N/A;  patent mLAD stent  w/ mild disease in remainder LAD and its branches;  mod. focal lesion midLCFx- FFR of lesion was negative for ischemia/  normal LVSF, ef 50%  . lungs  2005   "fluid pumped off lungs"  . NEUROPLASTY / TRANSPOSITION ULNAR NERVE AT ELBOW Right 2004  . PROSTATE BIOPSY N/A 08/31/2016    Procedure: PROSTATE  BIOPSY TRANSRECTAL ULTRASONIC PROSTATE (TUBP);  Surgeon: Ardis Hughs, MD;  Location: [facility];  Service: Urology;  Laterality: N/A;  . SPACE OAR INSTILLATION N/A 11/15/2016   Procedure: SPACE OAR INSTILLATION;  Surgeon: Ardis Hughs, MD;  Location: [facility];  Service: Urology;  Laterality: N/A;  . TRANSTHORACIC ECHOCARDIOGRAM  04/27/2016   severe focal basal LVH, ef 60-65%,  grade 2 diastoilc dysfunction/  mild AR, MR, and TR/  atrial septum lipomatous hypertrophy/  PASP 32mHg  . UMBILICAL HERNIA REPAIR      There were no vitals filed for this visit.                    [place] PT Treatment/Exercise - 11/05/17 0001      Ambulation/Gait   Ambulation/Gait  Yes    Ambulation/Gait Assistance  6: Modified independent (Device/Increase time)    Gait velocity  11.34 secs = 2.89 ft/sec       Standardized Balance Assessment   Standardized Balance Assessment  Timed Up and Go Test      Timed Up and Go Test   TUG  Normal TUG    Normal TUG (seconds)  13   no device     Knee/Hip Exercises: Machines for Strengthening   Total Gym Leg Press  80# 2 sets 10 reps:  45# LLE only 2 sets 10 reps       Knee/Hip Exercises: Standing   Heel Raises  Both;1 set;10 reps    Forward Step Up  Left;1 set;10 reps;Hand Hold: 0;Step Height: 6"    Step Down  Left;1 set;Hand Hold: 2;10 reps;Step Height: 6"                  PT Long Term Goals - 11/01/17 1414      PT LONG TERM GOAL #1   Title  Improve TUG score from 15.81 secs to </= 13.5 secs without device to decr. fall risk.    Baseline  15.81 secs on 08-28-17; 13.00    Status  Achieved      PT LONG TERM GOAL #2   Title  Increase gait velocity to >/= 2.7 ft/sec without device for incr. gait efficiency.      Baseline  2.42 ft/sec = 13.57 secs;  11.34 secs = 2.89    Status  Achieved      PT LONG TERM GOAL #3   Title  Increase distance in 3" walk test by at  least 75' to demo improved endurance.    Status  Deferred      PT LONG TERM GOAL #4   Title  Independent in HEP for balance exs.    Status  Achieved              Patient  will benefit from skilled therapeutic intervention in order to improve the following deficits and impairments:     Visit Diagnosis: Other abnormalities of gait and mobility  Unsteadiness on feet     Problem List Patient Active Problem List   Diagnosis Date Noted  . Radiation therapy complication 97/02/6376  . Primary prostate cancer ([place]) 07/31/2017  . Anemia 07/31/2017  . Vertigo due to cerebrovascular disease 07/31/2017  . Poor compliance with CPAP treatment 07/31/2017  . Absolute anemia 05/16/2017  . Avitaminosis D 05/16/2017  . Benign essential HTN 05/16/2017  . Benign prostatic hypertrophy without urinary obstruction 05/16/2017  . Clinical depression 05/16/2017  . CN (constipation) 05/16/2017  . Current drug use 05/16/2017  . Diabetic neuropathy ([place]) 05/16/2017  . Genital herpes 05/16/2017  . Infarction of lung due to iatrogenic pulmonary embolism ([place]) 05/16/2017  . Peripheral neuralgia 05/16/2017  . Arteriosclerosis of coronary artery 05/16/2017  . Artery disease, cerebral 05/16/2017  . Apnea, sleep 05/16/2017  . Arthralgia of hip or thigh 05/16/2017  . Malignant neoplasm of prostate ([place]) 09/28/2016  . Vascular dementia in remission ([place]) 09/28/2016  . Remote history of stroke 09/28/2016  . Acute kidney injury ([place]) 05/18/2016  . Dehydration 05/18/2016  . Near syncope 05/18/2016  . Orthostatic hypotension 05/18/2016  . CKD (chronic kidney disease), stage II 04/26/2016  . UTI (urinary tract infection) 04/26/2016  . Encounter for counseling on use of CPAP 11/18/2015  . Chronic cholecystitis with calculus 11/02/2015  . 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 TIA (transient ischemic attack) 11/03/2014  . OSA on CPAP 05/18/2014  . White matter disease 08/14/2013  . OSA  (obstructive sleep apnea) 08/14/2013  . Abnormal x-ray of temporomandibular joint 05/30/2013  . Chronic infection of sinus 05/30/2013  . Cough 05/30/2013  . Fatigue 05/30/2013  . Unsteady gait 05/19/2013  . Combined fat and carbohydrate induced hyperlipemia 04/24/2013  . Syncope 03/20/2013  . Angina pectoris ([place]) 10/08/2012  . Hypertension   . Stroke ([place])   . History of TIAs   . Lumbago   . Other and unspecified hyperlipidemia   . Personal history of unspecified circulatory disease   . Unspecified fall   . Pain in joint, multiple sites   . Degeneration of cervical intervertebral disc   . Unspecified cardiovascular disease   . History of pulmonary embolism: June 2014,  Takes Xarelto 07/01/2012    Class: History of  . Hemiparesis ([place]) 07/18/2011  . Diabetes mellitus type 2 with complications ([place]) 58/85/0277  . HTN (hypertension) 07/18/2011  . CAD in native artery 07/18/2011  . History of recurrent TIAs 07/18/2011    PHYSICAL THERAPY DISCHARGE SUMMARY  Visits from Start of Care: 8  Current functional level related to goals / functional outcomes: See above for progress towards goals - majority of LTG's have been achieved   Remaining deficits: Pt continues to c/o mild vertigo - varies in intensity per pt report   Education / Equipment:  Pt has been instructed in a HEP consisting of strengthening and balance exercises.  Pt reports he continues to go [facility] for [facility] program. Plan: Patient agrees to discharge.  Patient goals were met. Patient is being discharged due to meeting the stated rehab goals.  ?????          Alda Lea, PT 11/05/2017, 6:53 PM  [facility] [address] [place], [place], [address] Phone: [phone]   Fax:  [phone]  Name: Steve Brown MRN: [id number] Date of  Birth: 08-Dec-1940

## 2017-11-10 DIAGNOSIS — J019 Acute sinusitis, unspecified: Secondary | ICD-10-CM | POA: Diagnosis not present

## 2017-11-16 DIAGNOSIS — E1151 Type 2 diabetes mellitus with diabetic peripheral angiopathy without gangrene: Secondary | ICD-10-CM | POA: Diagnosis not present

## 2017-11-16 DIAGNOSIS — L603 Nail dystrophy: Secondary | ICD-10-CM | POA: Diagnosis not present

## 2017-11-16 DIAGNOSIS — I739 Peripheral vascular disease, unspecified: Secondary | ICD-10-CM | POA: Diagnosis not present

## 2017-12-12 DIAGNOSIS — M545 Low back pain: Secondary | ICD-10-CM | POA: Diagnosis not present

## 2017-12-12 DIAGNOSIS — M79606 Pain in leg, unspecified: Secondary | ICD-10-CM | POA: Diagnosis not present

## 2017-12-12 DIAGNOSIS — Z79899 Other long term (current) drug therapy: Secondary | ICD-10-CM | POA: Diagnosis not present

## 2017-12-12 DIAGNOSIS — Z79891 Long term (current) use of opiate analgesic: Secondary | ICD-10-CM | POA: Diagnosis not present

## 2017-12-12 DIAGNOSIS — G894 Chronic pain syndrome: Secondary | ICD-10-CM | POA: Diagnosis not present

## 2017-12-19 DIAGNOSIS — I739 Peripheral vascular disease, unspecified: Secondary | ICD-10-CM | POA: Diagnosis not present

## 2018-01-17 DIAGNOSIS — N419 Inflammatory disease of prostate, unspecified: Secondary | ICD-10-CM | POA: Diagnosis not present

## 2018-01-17 DIAGNOSIS — R109 Unspecified abdominal pain: Secondary | ICD-10-CM | POA: Diagnosis not present

## 2018-01-17 DIAGNOSIS — R05 Cough: Secondary | ICD-10-CM | POA: Diagnosis not present

## 2018-01-17 NOTE — Progress Notes (Signed)
Cardiology Office Note   Date:  01/21/2018   ID:  Steve Brown, DOB 1940-04-21, MRN [id number]  PCP:  Lawerance Cruel, MD    No chief complaint on file.  CAD  Wt Readings from Last 3 Encounters:  01/21/18 196 lb 9.6 oz (89.2 kg)  10/05/17 182 lb (82.6 kg)  09/25/17 185 lb (83.9 kg)       History of Present Illness: Steve Brown is a 78 y.o. male  With a h/o CAD. He has an LAd DES and moderate circumflex disease.  Per the record: He has a "past medical history significant for CAD s/p LAD stent 2010, bilateral PE 07/2012 on Xarelto, diabetes type 2, prostate cancer diagnosed in 2018, OSA on CPAP, hypertension, hyperlipidemia, history of TIAs and stroke, CKD stage II. Steve Brown had a stent to the LAD in 2010, 2012 cath showed patent stent and no significant CAD, cardiac cath in 2015showed moderate disease which was negative by FFR. His most recent echocardiogram in 04/2016 showed normal LV systolic function with EF 60-65 percent, normal wall motion, grade 2 diastolic dysfunction, mild AR, mildly dilated aortic root 39 mm, mild MR and increased thickness of the septum consistent with lipomatous hypertrophy."    Last cath in 2019 showed:  "Acute Mrg lesion is 25% stenosed.  Mid RCA lesion is 25% stenosed.  2nd Mrg lesion is 40% stenosed.  Mid Cx lesion is 40% stenosed.  Previously placed Mid LAD stent (unknown type) is widely patent.  Prox LAD-1 lesion is 25% stenosed.  Prox LAD-2 lesion is 25% stenosed.  The left ventricular systolic function is normal.  LV end diastolic pressure is normal.  The left ventricular ejection fraction is 55-65% by visual estimate.  There is no aortic valve stenosis.  Patent stent. Nonobstructive disease. Would investigate noncardiac causes of fatigue."    Since the last visit, he decreased exercise and gained some weight.   Denies : Chest pain.  Leg edema. Nitroglycerin use. Orthopnea. Palpitations. Paroxysmal nocturnal  dyspnea. Shortness of breath. Syncope.   One dizzy spell recently while he had a cold.   He had a TIA affecting the left side of his body in 9/19.  MRI was OK.  Left leg getting better.  Still feels tight.     Past Medical History:  Diagnosis Date  . Abnormal prostate biopsy   . Anticoagulant long-term use    currently xarelto  . BPH with elevated PSA   . CKD (chronic kidney disease), stage II   . Complication of anesthesia    limted neck rom limited use of left arm due to cva  . Coronary artery disease    CARDIOLOGIST-  DR Irish Lack--  2010-- PCI w/ stenting midLAD  . DDD (degenerative disc disease), lumbar   . Degeneration of cervical intervertebral disc   . Depression   . Dyspnea on exertion   . GERD (gastroesophageal reflux disease)   . Hemiparesis due to cerebral infarction   . History of cerebrovascular accident (CVA) with residual deficit 2002 and 2003--  hemiparisis both sides   per MRI  anterior left frontal lobe, left para midline pons, and inferior cerebullam bilaterally infarcts  . History of pulmonary embolus (PE)    06-30-2012  extensive bilaterally  . History of recurrent TIAs   . History of syncope    hx multiple pre-syncope and syncopal episodes due to vasovagal, orthostatic hypotension, dehydration  . History of TIAs    several since 2002  . Hyperlipidemia   .  Hypertension   . Mild atherosclerosis of carotid artery, bilateral    per last duplex 11-04-2014  bilateral ICA 1--39%  . Neuropathy    fingers  . OSA on CPAP    followed by dr dohmeier--  sev. osa w/ AHI 65.9  . Prostate cancer ([place]) dx 2018  . Renal insufficiency   . S/P coronary artery stent placement 2010   stenting to mid LAD  . Simple renal cyst    bilaterally  . Stroke ([place])   . Trigger finger of both hands 11-17-13  . Type 2 diabetes mellitus ([place]) dx 1986   last one A1c 9.2 on 04-26-2016  . Unsteady gait    . Hx prior CVA/TIAs;  . Vertebral artery occlusion, left    chronic     Past Surgical History:  Procedure Laterality Date  . ANTERIOR CERVICAL DECOMP/DISCECTOMY FUSION  2004   C3 -- C6 limited rom  . CARDIAC CATHETERIZATION  06-10-2010   dr Irish Lack   wide patent LAD stent, mid lesion at the origin of the septal prior to the previous stent 40-50%/  normal LVF, ef 55%  . CARDIOVASCULAR STRESS TEST  10-23-2012  dr Irish Lack   normal nuclear perfusion study w/ no ischemia/  normal LV function and wall motion , ef 65%  . CARPAL TUNNEL RELEASE Bilateral   . CATARACT EXTRACTION W/ INTRAOCULAR LENS  IMPLANT, BILATERAL    . CHOLECYSTECTOMY N/A 11/02/2015   Procedure: LAPAROSCOPIC CHOLECYSTECTOMY WITH INTRAOPERATIVE CHOLANGIOGRAM;  Surgeon: Donnie Mesa, MD;  Location: [facility];  Service: General;  Laterality: N/A;  . COLONOSCOPY    . CORONARY ANGIOPLASTY WITH STENT PLACEMENT  02/2008   stenting to mid LAD  . GOLD SEED IMPLANT N/A 11/15/2016   Procedure: GOLD SEED IMPLANT TIMES THREE;  Surgeon: Ardis Hughs, MD;  Location: [facility];  Service: Urology;  Laterality: N/A;  . LEFT HEART CATH AND CORONARY ANGIOGRAPHY N/A 05/25/2017   Procedure: LEFT HEART CATH AND CORONARY ANGIOGRAPHY;  Surgeon: Jettie Booze, MD;  Location: [facility];  Service: Cardiovascular;  Laterality: N/A;  . LEFT HEART CATHETERIZATION WITH CORONARY ANGIOGRAM N/A 04/03/2013   Procedure: LEFT HEART CATHETERIZATION WITH CORONARY ANGIOGRAM;  Surgeon: Jettie Booze, MD;  Location: [facility] CATH LAB;  Service: Cardiovascular;  Laterality: N/A;  patent mLAD stent  w/ mild disease in remainder LAD and its branches;  mod. focal lesion midLCFx- FFR of lesion was negative for ischemia/  normal LVSF, ef 50%  . lungs  2005   "fluid pumped off lungs"  . NEUROPLASTY / TRANSPOSITION ULNAR NERVE AT ELBOW Right 2004  . PROSTATE BIOPSY N/A 08/31/2016   Procedure: PROSTATE  BIOPSY TRANSRECTAL ULTRASONIC PROSTATE (TUBP);  Surgeon: Ardis Hughs, MD;  Location[facility];  Service: Urology;  Laterality: N/A;  . SPACE OAR INSTILLATION N/A 11/15/2016   Procedure: SPACE OAR INSTILLATION;  Surgeon: Ardis Hughs, MD;  Location: [facility];  Service: Urology;  Laterality: N/A;  . TRANSTHORACIC ECHOCARDIOGRAM  04/27/2016   severe focal basal LVH, ef 60-65%,  grade 2 diastoilc dysfunction/  mild AR, MR, and TR/  atrial septum lipomatous hypertrophy/  PASP 23mmHg  . UMBILICAL HERNIA REPAIR       Current Outpatient Medications  Medication Sig Dispense Refill  . acetaminophen (TYLENOL) 500 MG tablet Take 1,000 mg by mouth every 6 (six) hours as needed (pain).     Marland Kitchen albuterol (PROVENTIL HFA;VENTOLIN HFA) 108 (90 Base) MCG/ACT inhaler Inhale  2 puffs into the lungs every 6 (six) hours as needed for wheezing or shortness of breath.    . Ascorbic Acid (VITAMIN C PO) Take 1 tablet by mouth daily.    . BD PEN NEEDLE NANO U/F 32G X 4 MM MISC by Does not apply route.     . bisacodyl (DULCOLAX) 5 MG EC tablet Take 5 mg by mouth daily as needed for moderate constipation.    . cholecalciferol (VITAMIN D) 1000 UNITS tablet Take 1,000 Units by mouth daily.    . diclofenac sodium (VOLTAREN) 1 % GEL Apply 1 application topically See admin instructions. Apply topically once daily , may use an additional time as needed for pain  1  . Echinacea 450 MG CAPS Take 450 mg by mouth daily.     . fluticasone (FLONASE) 50 MCG/ACT nasal spray Place 1 spray into both nostrils 2 (two) times daily.   0  . furosemide (LASIX) 20 MG tablet Take 20 mg by mouth daily as needed (leg and ankle swelling).     . Insulin Glargine (TOUJEO SOLOSTAR) 300 UNIT/ML SOPN Inject 20-25 Units into the skin daily before breakfast.     . insulin lispro (HUMALOG KWIKPEN) 100 UNIT/ML KiwkPen Inject 6-10 Units into the skin 3 (three) times daily with meals.    . IRON PO Take 1 tablet by mouth daily.    . meclizine (ANTIVERT) 25 MG tablet Take 12.5-25 mg by mouth 3 (three) times daily as  needed for dizziness.    . metFORMIN (GLUCOPHAGE) 500 MG tablet Take 500 mg by mouth 2 (two) times daily as needed (CBG >200).     . Multiple Vitamin (MULTIVITAMIN WITH MINERALS) TABS tablet Take 1 tablet by mouth daily.    . nitroGLYCERIN (NITROSTAT) 0.4 MG SL tablet Place 1 tablet (0.4 mg total) under the tongue every 5 (five) minutes as needed for chest pain. 25 tablet 12  . pantoprazole (PROTONIX) 40 MG tablet Take 40 mg by mouth daily as needed (acid reflux/heartburn).     . pregabalin (LYRICA) 300 MG capsule Take 1 capsule (300 mg total) by mouth 2 (two) times daily. 180 capsule 3  . rivaroxaban (XARELTO) 20 MG TABS tablet Take 20 mg by mouth daily with supper.    . rosuvastatin (CRESTOR) 10 MG tablet Take 10 mg by mouth at bedtime.     . tamsulosin (FLOMAX) 0.4 MG CAPS capsule Take 0.4 mg by mouth daily.    . valACYclovir (VALTREX) 500 MG tablet Take 500 mg by mouth daily.     No current facility-administered medications for this visit.     Allergies:   Other and Phenergan [promethazine]    Social History:  The patient  reports that he has never smoked. He has never used smokeless tobacco. He reports that he does not drink alcohol or use drugs.   Family History:  The patient's family history includes Aneurysm in his mother; Cancer in his father.    ROS:  Please see the history of present illness.   Otherwise, review of systems are positive for left side pain.   All other systems are reviewed and negative.    PHYSICAL EXAM: VS:  BP 122/62   Pulse 66   Ht 5\' 5"  (1.651 m)   Wt 196 lb 9.6 oz (89.2 kg)   BMI 32.72 kg/m  , BMI Body mass index is 32.72 kg/m. GEN: Well nourished, well developed, in no acute distress  HEENT: normal  Neck: no JVD, carotid bruits,  or masses Cardiac: RRR; no murmurs, rubs, or gallops,no edema  Respiratory:  clear to auscultation bilaterally, normal work of breathing GI: soft, nontender, nondistended, + BS MS: no deformity or atrophy  Skin: warm and  dry, no rash Neuro:  Strength and sensation are intact Psych: euthymic mood, full affect   Recent Labs: 09/09/2017: BUN 36; Creatinine, Ser 1.44; Hemoglobin 14.0; Platelets 200; Potassium 4.7; Sodium 137   Lipid Panel    Component Value Date/Time   CHOL 144 03/21/2013 0400   TRIG 122 03/21/2013 0400   HDL 57 03/21/2013 0400   CHOLHDL 2.5 03/21/2013 0400   VLDL 24 03/21/2013 0400   LDLCALC 63 03/21/2013 0400     Other studies Reviewed: Additional studies/ records that were reviewed today with results demonstrating: 2018: EF normal..   ASSESSMENT AND PLAN:  1. CAD: No angina on medical therapy. Lipids controlled in 2019.  Increase activity and change diet as noted below.   2. DM: A1C 8.1.  Discussed more plant based diet.  Increase fiber as well.  3. HTN: The current medical regimen is effective;  continue present plan and medications. 4. OSA: Using CPAP.  5. Anticoagulated:  On Xarelto for prior PE.  No bleeding problems.  6. Dizziness: He has seen ENT before, but he did not want to consider surgery.  I discussed sending him to Dr. Benjamine Mola for rehab options. He is agreeable.    Current medicines are reviewed at length with the patient today.  The patient concerns regarding his medicines were addressed.  The following changes have been made:  No change  Labs/ tests ordered today include:  No orders of the defined types were placed in this encounter.   Recommend 150 minutes/week of aerobic exercise Low fat, low carb, high fiber diet recommended  Disposition:   FU in 1 year   Signed, Larae Grooms, MD  01/21/2018 11:44 AM    [facility] [place], [place], [place]  [address] Phone: [phone]; Fax: [phone]

## 2018-01-20 ENCOUNTER — Telehealth: Payer: Self-pay

## 2018-01-20 NOTE — Telephone Encounter (Signed)
Patient meets inclusion criteria for current pharmacy residency project to initiate SGLT2i or GLP1-RA therapy for cardiovascular benefit due to current diagnosis of ASCVD and DM.  Patient has scheduled appointment with [name] on 01/21/18.  Pharmacist will discuss initiation of Victoza or Jardiance and enrollment into study with patient and provider at upcoming office visit. Patient may benefit more from Jardiance due to CKD stage II, however patient also has a history of UTI.   Relevant labs and dates: Lab Results  Component Value Date   HGBA1C 9.2 (H) 04/26/2016  HGBA1C  8.1 on 05/2017 per [place]   Lab Results  Component Value Date   CREATININE 1.44 (H) 09/09/2017   CREATININE 1.36 (H) 05/17/2017   CREATININE 1.41 (H) 11/12/2016   CrCl cannot be calculated (Patient's most recent lab result is older than the maximum 21 days allowed.). Wt Readings from Last 1 Encounters:  10/05/17 182 lb (82.6 kg)   BP Readings from Last 1 Encounters:  10/05/17 117/65    Metformin use: Tor Netters D PGY1 Pharmacy Resident   01/20/2018      1:57 PM

## 2018-01-21 ENCOUNTER — Encounter: Payer: Self-pay | Admitting: Interventional Cardiology

## 2018-01-21 ENCOUNTER — Ambulatory Visit (INDEPENDENT_AMBULATORY_CARE_PROVIDER_SITE_OTHER): Payer: Medicare Other | Admitting: Interventional Cardiology

## 2018-01-21 VITALS — BP 122/62 | HR 66 | Ht 65.0 in | Wt 196.6 lb

## 2018-01-21 DIAGNOSIS — R42 Dizziness and giddiness: Secondary | ICD-10-CM

## 2018-01-21 DIAGNOSIS — I10 Essential (primary) hypertension: Secondary | ICD-10-CM | POA: Diagnosis not present

## 2018-01-21 DIAGNOSIS — G4733 Obstructive sleep apnea (adult) (pediatric): Secondary | ICD-10-CM

## 2018-01-21 DIAGNOSIS — Z7901 Long term (current) use of anticoagulants: Secondary | ICD-10-CM | POA: Diagnosis not present

## 2018-01-21 DIAGNOSIS — I25118 Atherosclerotic heart disease of native coronary artery with other forms of angina pectoris: Secondary | ICD-10-CM | POA: Diagnosis not present

## 2018-01-21 DIAGNOSIS — E1159 Type 2 diabetes mellitus with other circulatory complications: Secondary | ICD-10-CM | POA: Diagnosis not present

## 2018-01-21 NOTE — Patient Instructions (Signed)
Medication Instructions:  Your physician recommends that you continue on your current medications as directed. Please refer to the Current Medication list given to you today.  If you need a refill on your cardiac medications before your next appointment, please call your pharmacy.   Lab work: None Ordered  If you have labs (blood work) drawn today and your tests are completely normal, you will receive your results only by: Marland Kitchen MyChart Message (if you have MyChart) OR . A paper copy in the mail If you have any lab test that is abnormal or we need to change your treatment, we will call you to review the results.  Testing/Procedures: None ordered  Follow-Up: You have been referred to ENT for dizziness   At [facility], you and your health needs are our priority.  As part of our continuing mission to provide you with exceptional heart care, we have created designated Provider Care Teams.  These Care Teams include your primary Cardiologist (physician) and Advanced Practice Providers (APPs -  Physician Assistants and Nurse Practitioners) who all work together to provide you with the care you need, when you need it. . You will need a follow up appointment in 1 year.  Please call our office 2 months in advance to schedule this appointment.  You may see Casandra Doffing, MD or one of the following Advanced Practice Providers on your designated Care Team:   . Lyda Jester, PA-C . Dayna Dunn, PA-C . Ermalinda Barrios, PA-C  Any Other Special Instructions Will Be Listed Below (If Applicable).

## 2018-02-05 DIAGNOSIS — E7800 Pure hypercholesterolemia, unspecified: Secondary | ICD-10-CM | POA: Diagnosis not present

## 2018-02-05 DIAGNOSIS — E1165 Type 2 diabetes mellitus with hyperglycemia: Secondary | ICD-10-CM | POA: Diagnosis not present

## 2018-02-05 DIAGNOSIS — I10 Essential (primary) hypertension: Secondary | ICD-10-CM | POA: Diagnosis not present

## 2018-02-07 DIAGNOSIS — J069 Acute upper respiratory infection, unspecified: Secondary | ICD-10-CM | POA: Diagnosis not present

## 2018-02-08 DIAGNOSIS — H838X3 Other specified diseases of inner ear, bilateral: Secondary | ICD-10-CM | POA: Diagnosis not present

## 2018-02-08 DIAGNOSIS — R42 Dizziness and giddiness: Secondary | ICD-10-CM | POA: Diagnosis not present

## 2018-02-08 DIAGNOSIS — H903 Sensorineural hearing loss, bilateral: Secondary | ICD-10-CM | POA: Diagnosis not present

## 2018-02-14 DIAGNOSIS — R42 Dizziness and giddiness: Secondary | ICD-10-CM | POA: Diagnosis not present

## 2018-02-14 DIAGNOSIS — H903 Sensorineural hearing loss, bilateral: Secondary | ICD-10-CM | POA: Diagnosis not present

## 2018-02-19 DIAGNOSIS — E1042 Type 1 diabetes mellitus with diabetic polyneuropathy: Secondary | ICD-10-CM | POA: Diagnosis not present

## 2018-02-27 DIAGNOSIS — R42 Dizziness and giddiness: Secondary | ICD-10-CM | POA: Diagnosis not present

## 2018-03-05 DIAGNOSIS — R42 Dizziness and giddiness: Secondary | ICD-10-CM | POA: Diagnosis not present

## 2018-03-06 DIAGNOSIS — G894 Chronic pain syndrome: Secondary | ICD-10-CM | POA: Diagnosis not present

## 2018-03-06 DIAGNOSIS — M545 Low back pain: Secondary | ICD-10-CM | POA: Diagnosis not present

## 2018-03-06 DIAGNOSIS — I69354 Hemiplegia and hemiparesis following cerebral infarction affecting left non-dominant side: Secondary | ICD-10-CM | POA: Diagnosis not present

## 2018-03-06 DIAGNOSIS — M79605 Pain in left leg: Secondary | ICD-10-CM | POA: Diagnosis not present

## 2018-03-06 DIAGNOSIS — I10 Essential (primary) hypertension: Secondary | ICD-10-CM | POA: Diagnosis not present

## 2018-03-06 DIAGNOSIS — Z79899 Other long term (current) drug therapy: Secondary | ICD-10-CM | POA: Diagnosis not present

## 2018-03-06 DIAGNOSIS — F411 Generalized anxiety disorder: Secondary | ICD-10-CM | POA: Diagnosis not present

## 2018-03-06 DIAGNOSIS — I779 Disorder of arteries and arterioles, unspecified: Secondary | ICD-10-CM | POA: Diagnosis not present

## 2018-03-06 DIAGNOSIS — Z79891 Long term (current) use of opiate analgesic: Secondary | ICD-10-CM | POA: Diagnosis not present

## 2018-03-06 DIAGNOSIS — M79606 Pain in leg, unspecified: Secondary | ICD-10-CM | POA: Diagnosis not present

## 2018-03-08 ENCOUNTER — Other Ambulatory Visit: Payer: Self-pay | Admitting: Family Medicine

## 2018-03-08 DIAGNOSIS — I739 Peripheral vascular disease, unspecified: Principal | ICD-10-CM

## 2018-03-08 DIAGNOSIS — I779 Disorder of arteries and arterioles, unspecified: Secondary | ICD-10-CM

## 2018-03-12 DIAGNOSIS — R2689 Other abnormalities of gait and mobility: Secondary | ICD-10-CM | POA: Diagnosis not present

## 2018-03-19 DIAGNOSIS — R2689 Other abnormalities of gait and mobility: Secondary | ICD-10-CM | POA: Diagnosis not present

## 2018-03-24 ENCOUNTER — Other Ambulatory Visit: Payer: Self-pay

## 2018-03-24 ENCOUNTER — Ambulatory Visit
Admission: RE | Admit: 2018-03-24 | Discharge: 2018-03-24 | Disposition: A | Discharge status: Home / Self Care | Payer: Medicare Other | Source: Ambulatory Visit | Attending: Family Medicine | Admitting: Family Medicine

## 2018-03-24 DIAGNOSIS — I739 Peripheral vascular disease, unspecified: Principal | ICD-10-CM

## 2018-03-24 DIAGNOSIS — I779 Disorder of arteries and arterioles, unspecified: Secondary | ICD-10-CM

## 2018-03-24 DIAGNOSIS — I63542 Cerebral infarction due to unspecified occlusion or stenosis of left cerebellar artery: Secondary | ICD-10-CM | POA: Diagnosis not present

## 2018-03-26 DIAGNOSIS — R2689 Other abnormalities of gait and mobility: Secondary | ICD-10-CM | POA: Diagnosis not present

## 2018-03-27 DIAGNOSIS — R930 Abnormal findings on diagnostic imaging of skull and head, not elsewhere classified: Secondary | ICD-10-CM | POA: Diagnosis not present

## 2018-03-27 DIAGNOSIS — R42 Dizziness and giddiness: Secondary | ICD-10-CM | POA: Diagnosis not present

## 2018-04-03 DIAGNOSIS — R2689 Other abnormalities of gait and mobility: Secondary | ICD-10-CM | POA: Diagnosis not present

## 2018-04-11 ENCOUNTER — Telehealth (HOSPITAL_COMMUNITY): Payer: Self-pay

## 2018-04-11 NOTE — Telephone Encounter (Signed)
Called pt to inform him that we received his referral. Dr. Estanislado Pandy has reviewed and would like to schedule an angio after th COVID19 restrictions are lifted. Pt was also informed to give [place] a call if his sx worsen, we may have to get him in sooner. Pt agreed with this plan. Will call pt end of April to schedule. AW

## 2018-04-22 DIAGNOSIS — Z8546 Personal history of malignant neoplasm of prostate: Secondary | ICD-10-CM | POA: Diagnosis not present

## 2018-04-29 DIAGNOSIS — R8271 Bacteriuria: Secondary | ICD-10-CM | POA: Diagnosis not present

## 2018-04-29 DIAGNOSIS — Z8546 Personal history of malignant neoplasm of prostate: Secondary | ICD-10-CM | POA: Diagnosis not present

## 2018-04-29 DIAGNOSIS — R351 Nocturia: Secondary | ICD-10-CM | POA: Diagnosis not present

## 2018-04-29 DIAGNOSIS — N5201 Erectile dysfunction due to arterial insufficiency: Secondary | ICD-10-CM | POA: Diagnosis not present

## 2018-05-17 DIAGNOSIS — R2689 Other abnormalities of gait and mobility: Secondary | ICD-10-CM | POA: Diagnosis not present

## 2018-05-24 ENCOUNTER — Telehealth (HOSPITAL_COMMUNITY): Payer: Self-pay | Admitting: Radiology

## 2018-05-24 ENCOUNTER — Other Ambulatory Visit (HOSPITAL_COMMUNITY): Payer: Self-pay | Admitting: Interventional Radiology

## 2018-05-24 DIAGNOSIS — R930 Abnormal findings on diagnostic imaging of skull and head, not elsewhere classified: Secondary | ICD-10-CM

## 2018-05-24 DIAGNOSIS — R2689 Other abnormalities of gait and mobility: Secondary | ICD-10-CM | POA: Diagnosis not present

## 2018-05-24 DIAGNOSIS — R42 Dizziness and giddiness: Secondary | ICD-10-CM

## 2018-05-24 NOTE — Telephone Encounter (Signed)
Called pt, left VM for him to call to schedule cerebral angio with Deveshwar JM

## 2018-05-28 ENCOUNTER — Telehealth: Payer: Self-pay

## 2018-05-28 NOTE — Telephone Encounter (Signed)
Unable to get in contact with the patient to convert their office visit with [facility] on 06/06/2018 into a doxy.me visit. I left a voicemail asking the patient to return my call. Office number was provided.   If patient calls back please convert their office visit into a doxy.me visit.

## 2018-05-29 DIAGNOSIS — G894 Chronic pain syndrome: Secondary | ICD-10-CM | POA: Diagnosis not present

## 2018-05-29 DIAGNOSIS — M542 Cervicalgia: Secondary | ICD-10-CM | POA: Diagnosis not present

## 2018-05-29 DIAGNOSIS — Z79891 Long term (current) use of opiate analgesic: Secondary | ICD-10-CM | POA: Diagnosis not present

## 2018-05-29 DIAGNOSIS — G629 Polyneuropathy, unspecified: Secondary | ICD-10-CM | POA: Diagnosis not present

## 2018-05-29 DIAGNOSIS — C61 Malignant neoplasm of prostate: Secondary | ICD-10-CM | POA: Diagnosis not present

## 2018-05-29 DIAGNOSIS — Z79899 Other long term (current) drug therapy: Secondary | ICD-10-CM | POA: Diagnosis not present

## 2018-05-29 NOTE — Telephone Encounter (Signed)
Noted! Thank you

## 2018-05-29 NOTE — Telephone Encounter (Signed)
5-20 pt has called and gave verbal okay to file insurance for doxy.me vv pt email address:[email or web address]  Pt understands that although there may be some limitations with this type of visit, we will take all precautions to reduce any security or privacy concerns.  Pt understands that this will be treated like an in office visit and we will file with pt's insurance, and there may be a patient responsible charge related to this service.

## 2018-05-31 ENCOUNTER — Telehealth: Payer: Self-pay | Admitting: Student

## 2018-05-31 ENCOUNTER — Other Ambulatory Visit: Payer: Self-pay | Admitting: Student

## 2018-05-31 DIAGNOSIS — R2689 Other abnormalities of gait and mobility: Secondary | ICD-10-CM | POA: Diagnosis not present

## 2018-05-31 NOTE — Telephone Encounter (Signed)
Phoned premedication prescriptions (due to contrast allergy) to [facility] in [place], [place] [phone]) at [phone]:  1. Prednisone 50 mg tablets- take one tablet by mouth at 0730 pm 06/03/2018, take one tablet by mouth at 0130 am 06/04/2018, and take one tablet by mouth 0730 am 06/04/2018 in preparation for procedure; dispense 3 tablets with 0 refills. 2. Benadryl 50 mg tablets- take one tablet by mouth at 0730 am 06/04/2018 in preparation for procedure; dispense 1 tablet with 0 refills.   Bea Graff Jaylnn Ullery, PA-C 05/31/2018, 9:58 AM

## 2018-06-04 ENCOUNTER — Ambulatory Visit (HOSPITAL_COMMUNITY): Admission: RE | Admit: 2018-06-04 | Payer: Medicare Other | Source: Ambulatory Visit

## 2018-06-04 NOTE — Telephone Encounter (Signed)
Called pt and r/s appt to Debbora Presto NP for 03-08-18 at 1000, email resent to [email or web address].

## 2018-06-06 ENCOUNTER — Ambulatory Visit: Payer: Medicare Other | Admitting: Adult Health

## 2018-06-06 ENCOUNTER — Ambulatory Visit (INDEPENDENT_AMBULATORY_CARE_PROVIDER_SITE_OTHER): Payer: Medicare Other | Admitting: Family Medicine

## 2018-06-06 ENCOUNTER — Other Ambulatory Visit: Payer: Self-pay

## 2018-06-06 ENCOUNTER — Encounter: Payer: Self-pay | Admitting: Family Medicine

## 2018-06-06 DIAGNOSIS — Z9989 Dependence on other enabling machines and devices: Secondary | ICD-10-CM

## 2018-06-06 DIAGNOSIS — G4733 Obstructive sleep apnea (adult) (pediatric): Secondary | ICD-10-CM

## 2018-06-06 DIAGNOSIS — E1165 Type 2 diabetes mellitus with hyperglycemia: Secondary | ICD-10-CM | POA: Diagnosis not present

## 2018-06-06 NOTE — Progress Notes (Signed)
PATIENT: Steve Brown DOB: Feb 14, 1940  REASON FOR VISIT: follow up HISTORY FROM: patient  Virtual Visit via Telephone Note  I connected with Steve Brown on 06/06/18 at 10:00 AM EDT by telephone and verified that I am speaking with the correct person using two identifiers.   I discussed the limitations, risks, security and privacy concerns of performing an evaluation and management service by telephone and the availability of in person appointments. I also discussed with the patient that there may be a patient responsible charge related to this service. The patient expressed understanding and agreed to proceed.   History of Present Illness:  06/06/18 Steve Brown is a 78 y.o. male for follow up of OSA on CPAP. He reports doing well with CPAP therapy. Download report dated 05/05/2021 Jun 04, 2018 reveals that he is using his machine 25 out of the last 30 days for compliance of 83%.  He is used his machine greater than 4 hours 25 out of 30 days for compliance of 83%.  Average usage was 6 hours and 43 minutes.  AHI was 2.4 on 13 cm of water and an EPR level of 3.  There is a high leak of 97.5.  At his last visit with [facility] in September he was sent for mask refitting.  He reports that while being fitted for mask there was no significant leak.  When he returned home he reports that his straps are too tight.  He is unable to get the straps in a comfortable position without a significant leak.  He is feeling well rested.  He feels that he sleeps better.    HISTORY (copied from [place] note on 09/25/2017)  Steve Brown is a 78 year old male with a history of obstructive sleep apnea on CPAP and dizziness.  He returns today for follow-up.  His CPAP download indicates that he use his machine 21 out of 30 days for compliance of 70%.  He uses machine greater than 4 hours 20 days for compliance of 67%.  On average he uses his machine 6 hours and 4 minutes.  His residual AHI is 4.2 on 13  cmH2O with EPR of 3.  His leak in the 95th percentile is 103.4 L/min.  Patient states that when he wakes up at night his mask is still on his face but it is leaking significantly.  He has tried adjusting his straps but it continues to leak.  He reports that he continues to have episodes of dizziness.  He is currently in vestibular rehab.  His blood pressure is low today.  He reports that he is no longer on any blood pressure medication.  He returns today for evaluation.   HISTORY  31 July 2017 (copied from Dr. Edwena Felty notes) for [name], a 78 year old African-American right-handed gentleman who is presenting with cognitive concerns concerns of vascular origin. He has been diagnosed with a history of multi-infarct cerebrovascular disease, hypertension, diabetes, has been followed for CPAP compliance, was on chronic anticoagulation but no longer is able to take aspirin. He reports dizziness - lightheaded, not exercise tolerant. Worse when standing up. Antivert was given by PCP. This has to be seen in relation to changes in RBC and WBC - Dr Waymon Budge follows also his anticoagulants.   With CPAP he had for a while drastically improved his apnea count but he is not using the CPAP on a nightly basis. He has now again nocturia and sleepiness, higher fatigue- see below.  CPAPCompliance for the last 30  days was only 30% and average of 1 hours 38 minutes, CPAP was set at 13 cm with 3 cm EPR he has a high air leak but the residual apnea count is only 2.2/h. I am not sure how I can encourage him to use the machine more often.   In light of the his CPAP noncompliance is also the results of recent MRIs of the brain on 23 July 2017. These were ordered by Dr. Harrington Challenger, his primary care physician. MRI brain with and without contrast showed a 4 mm right periventricular occipital focus of reduced diffusion likely a new stroke, older strokes affect the right cerebellar region, the left frontal lobe,  patchy pontine and supratentorial white matter intensities. There is also chronically occluded left vertebral artery. Known is also that the patient has myelomalacia and his cervical spine MRI was done without contrast =It showed between C3 and the 6 cervical vertebrae and anterior cervical fusion with arthrodesis, he does not have canal stenosis but neuroforaminal narrowing on multiple levels. This may also explain some arm pain or shoulder pain or even weakness.  His Mini-Mental status examination today was 26 out of 30 points, the Epworth sleepiness score 11 points, fatigue severity was high at 47 out of 63 possible points.  The patient also presented with a very low white blood cell count to his primary care physician WBC was only 3.8, RBC 4.0 hemoglobin 12.9 hematocrit 38 MCV 95 which is high, neutrophils 1.6K per microliter which is low.- has adiagnosis of non-diabetic retinopathy was a working diagnosis. He is followed by Dr. Geroge Baseman, a podiatrist. He is not driving now.   Observations/Objective:  Generalized: Well developed, in no acute distress  Mentation: Alert oriented to time, place, history taking. Follows all commands speech and language fluent   Assessment and Plan:  78 y.o. year old male  has a past medical history of Abnormal prostate biopsy, Anticoagulant long-term use, BPH with elevated PSA, CKD (chronic kidney disease), stage II, Complication of anesthesia, Coronary artery disease, DDD (degenerative disc disease), lumbar, Degeneration of cervical intervertebral disc, Depression, Dyspnea on exertion, GERD (gastroesophageal reflux disease), Hemiparesis due to cerebral infarction, History of cerebrovascular accident (CVA) with residual deficit (2002 and 2003--  hemiparisis both sides), History of pulmonary embolus (PE), History of recurrent TIAs, History of syncope, History of TIAs, Hyperlipidemia, Hypertension, Mild atherosclerosis of carotid artery, bilateral, Neuropathy,  OSA on CPAP, Prostate cancer ([place]) (dx 2018), Renal insufficiency, S/P coronary artery stent placement (2010), Simple renal cyst, Stroke [facility]), Trigger finger of both hands (11-17-13), Type 2 diabetes mellitus ([place]) (dx 1986), Unsteady gait, and Vertebral artery occlusion, left. here with    ICD-10-CM   1. OSA on CPAP G47.33    Z99.89    He is doing well with CPAP therapy.  Download reveals optimal compliance.  He was encouraged to continue nightly use of CPAP and for greater than 4 hours each night.  He does continue to have a significant leak. He was advised that the only way to fix this was to tighten strap. He states that he will try.  I recommend 6 month follow up. He verbalizes understanding and agreement with this plan.   No orders of the defined types were placed in this encounter.   No orders of the defined types were placed in this encounter.    Follow Up Instructions:  I discussed the assessment and treatment plan with the patient. The patient was provided an opportunity to ask questions and all were answered.  The patient agreed with the plan and demonstrated an understanding of the instructions.   The patient was advised to call back or seek an in-person evaluation if the symptoms worsen or if the condition fails to improve as anticipated.  I provided 20 minutes of non-face-to-face time during this encounter.  Patient is located at his place of residence during video conference.  Provider is located at the office.  Liane Comber, RN helped to facilitate visit.   Debbora Presto, NP

## 2018-06-11 ENCOUNTER — Ambulatory Visit (HOSPITAL_COMMUNITY): Admission: RE | Admit: 2018-06-11 | Payer: Medicare Other | Source: Ambulatory Visit

## 2018-06-12 ENCOUNTER — Other Ambulatory Visit: Payer: Self-pay | Admitting: Student

## 2018-06-12 ENCOUNTER — Other Ambulatory Visit: Payer: Self-pay | Admitting: Radiology

## 2018-06-13 ENCOUNTER — Encounter (HOSPITAL_COMMUNITY): Payer: Self-pay

## 2018-06-13 ENCOUNTER — Other Ambulatory Visit: Payer: Self-pay

## 2018-06-13 ENCOUNTER — Other Ambulatory Visit (HOSPITAL_COMMUNITY): Payer: Self-pay | Admitting: Interventional Radiology

## 2018-06-13 ENCOUNTER — Ambulatory Visit (HOSPITAL_COMMUNITY)
Admission: RE | Admit: 2018-06-13 | Discharge: 2018-06-13 | Disposition: A | Discharge status: Home / Self Care | Payer: Medicare Other | Source: Ambulatory Visit | Attending: Interventional Radiology | Admitting: Interventional Radiology

## 2018-06-13 DIAGNOSIS — I6502 Occlusion and stenosis of left vertebral artery: Secondary | ICD-10-CM | POA: Diagnosis not present

## 2018-06-13 DIAGNOSIS — I69354 Hemiplegia and hemiparesis following cerebral infarction affecting left non-dominant side: Secondary | ICD-10-CM | POA: Insufficient documentation

## 2018-06-13 DIAGNOSIS — Z955 Presence of coronary angioplasty implant and graft: Secondary | ICD-10-CM | POA: Insufficient documentation

## 2018-06-13 DIAGNOSIS — Z86711 Personal history of pulmonary embolism: Secondary | ICD-10-CM | POA: Diagnosis not present

## 2018-06-13 DIAGNOSIS — R930 Abnormal findings on diagnostic imaging of skull and head, not elsewhere classified: Secondary | ICD-10-CM

## 2018-06-13 DIAGNOSIS — Z79899 Other long term (current) drug therapy: Secondary | ICD-10-CM | POA: Diagnosis not present

## 2018-06-13 DIAGNOSIS — I6521 Occlusion and stenosis of right carotid artery: Secondary | ICD-10-CM | POA: Diagnosis not present

## 2018-06-13 DIAGNOSIS — Z888 Allergy status to other drugs, medicaments and biological substances status: Secondary | ICD-10-CM | POA: Insufficient documentation

## 2018-06-13 DIAGNOSIS — I129 Hypertensive chronic kidney disease with stage 1 through stage 4 chronic kidney disease, or unspecified chronic kidney disease: Secondary | ICD-10-CM | POA: Diagnosis not present

## 2018-06-13 DIAGNOSIS — K219 Gastro-esophageal reflux disease without esophagitis: Secondary | ICD-10-CM | POA: Diagnosis not present

## 2018-06-13 DIAGNOSIS — E1122 Type 2 diabetes mellitus with diabetic chronic kidney disease: Secondary | ICD-10-CM | POA: Diagnosis not present

## 2018-06-13 DIAGNOSIS — E785 Hyperlipidemia, unspecified: Secondary | ICD-10-CM | POA: Insufficient documentation

## 2018-06-13 DIAGNOSIS — G4733 Obstructive sleep apnea (adult) (pediatric): Secondary | ICD-10-CM | POA: Diagnosis not present

## 2018-06-13 DIAGNOSIS — R42 Dizziness and giddiness: Secondary | ICD-10-CM | POA: Diagnosis present

## 2018-06-13 DIAGNOSIS — Z794 Long term (current) use of insulin: Secondary | ICD-10-CM | POA: Insufficient documentation

## 2018-06-13 DIAGNOSIS — N182 Chronic kidney disease, stage 2 (mild): Secondary | ICD-10-CM | POA: Insufficient documentation

## 2018-06-13 DIAGNOSIS — Z7901 Long term (current) use of anticoagulants: Secondary | ICD-10-CM | POA: Diagnosis not present

## 2018-06-13 DIAGNOSIS — E1140 Type 2 diabetes mellitus with diabetic neuropathy, unspecified: Secondary | ICD-10-CM | POA: Diagnosis not present

## 2018-06-13 DIAGNOSIS — I69351 Hemiplegia and hemiparesis following cerebral infarction affecting right dominant side: Secondary | ICD-10-CM | POA: Insufficient documentation

## 2018-06-13 DIAGNOSIS — Z8249 Family history of ischemic heart disease and other diseases of the circulatory system: Secondary | ICD-10-CM | POA: Diagnosis not present

## 2018-06-13 HISTORY — PX: IR ANGIO VERTEBRAL SEL VERTEBRAL UNI R MOD SED: IMG5368

## 2018-06-13 HISTORY — PX: IR US GUIDE VASC ACCESS RIGHT: IMG2390

## 2018-06-13 HISTORY — PX: IR ANGIO INTRA EXTRACRAN SEL COM CAROTID INNOMINATE BILAT MOD SED: IMG5360

## 2018-06-13 LAB — BASIC METABOLIC PANEL
Anion gap: 9 (ref 5–15)
BUN: 26 mg/dL — ABNORMAL HIGH (ref 8–23)
CO2: 22 mmol/L (ref 22–32)
Calcium: 9.1 mg/dL (ref 8.9–10.3)
Chloride: 103 mmol/L (ref 98–111)
Creatinine, Ser: 1.6 mg/dL — ABNORMAL HIGH (ref 0.61–1.24)
GFR calc Af Amer: 48 mL/min — ABNORMAL LOW (ref >60)
GFR calc non Af Amer: 41 mL/min — ABNORMAL LOW (ref >60)
Glucose, Bld: 354 mg/dL — ABNORMAL HIGH (ref 70–99)
Potassium: 4.4 mmol/L (ref 3.5–5.1)
Sodium: 134 mmol/L — ABNORMAL LOW (ref 135–145)

## 2018-06-13 LAB — CBC
HCT: 39.5 % (ref 39.0–52.0)
Hemoglobin: 13.4 g/dL (ref 13.0–17.0)
MCH: 31.3 pg (ref 26.0–34.0)
MCHC: 33.9 g/dL (ref 30.0–36.0)
MCV: 92.3 fL (ref 80.0–100.0)
Platelets: 193 10*3/uL (ref 150–400)
RBC: 4.28 MIL/uL (ref 4.22–5.81)
RDW: 12.5 % (ref 11.5–15.5)
WBC: 6.1 10*3/uL (ref 4.0–10.5)
nRBC: 0 % (ref 0.0–0.2)

## 2018-06-13 LAB — PROTIME-INR
INR: 1.1 (ref 0.8–1.2)
Prothrombin Time: 14.2 seconds (ref 11.4–15.2)

## 2018-06-13 LAB — GLUCOSE, CAPILLARY
Glucose-Capillary: 323 mg/dL — ABNORMAL HIGH (ref 70–99)
Glucose-Capillary: 296 mg/dL — ABNORMAL HIGH (ref 70–99)

## 2018-06-13 MED ORDER — MIDAZOLAM HCL 2 MG/2ML IJ SOLN
INTRAMUSCULAR | Status: AC
  Filled 2018-06-13: qty 2

## 2018-06-13 MED ORDER — HEPARIN SODIUM (PORCINE) 1000 UNIT/ML IJ SOLN
INTRAMUSCULAR | Status: AC | PRN
  Administered 2018-06-13: 2000 [IU] via INTRA_ARTERIAL

## 2018-06-13 MED ORDER — SODIUM CHLORIDE 0.9 % IV SOLN: INTRAVENOUS | Status: AC

## 2018-06-13 MED ORDER — SODIUM CHLORIDE 0.9 % IV SOLN
INTRAVENOUS | Status: DC
  Administered 2018-06-13: 08:00:00 via INTRAVENOUS

## 2018-06-13 MED ORDER — NITROGLYCERIN 1 MG/10 ML FOR IR/CATH LAB
INTRA_ARTERIAL | Status: AC
  Filled 2018-06-13: qty 10

## 2018-06-13 MED ORDER — INSULIN ASPART 100 UNIT/ML ~~LOC~~ SOLN
0.0000 [IU] | Freq: Three times a day (TID) | SUBCUTANEOUS | Status: DC
  Administered 2018-06-13: 09:00:00 15 [IU] via SUBCUTANEOUS

## 2018-06-13 MED ORDER — IOHEXOL 300 MG/ML  SOLN: 70.0000 mL | Freq: Once | INTRAMUSCULAR | Status: DC | PRN

## 2018-06-13 MED ORDER — VERAPAMIL HCL 2.5 MG/ML IV SOLN
INTRAVENOUS | Status: AC | PRN
  Administered 2018-06-13: 2.5 mg via INTRA_ARTERIAL

## 2018-06-13 MED ORDER — HEPARIN SODIUM (PORCINE) 1000 UNIT/ML IJ SOLN
INTRAMUSCULAR | Status: AC
  Filled 2018-06-13: qty 1

## 2018-06-13 MED ORDER — NITROGLYCERIN 1 MG/10 ML FOR IR/CATH LAB
INTRA_ARTERIAL | Status: AC | PRN
  Administered 2018-06-13 (×2): 200 ug via INTRA_ARTERIAL

## 2018-06-13 MED ORDER — FENTANYL CITRATE (PF) 100 MCG/2ML IJ SOLN
INTRAMUSCULAR | Status: AC | PRN
  Administered 2018-06-13: 25 ug via INTRAVENOUS

## 2018-06-13 MED ORDER — LIDOCAINE HCL 1 % IJ SOLN
INTRAMUSCULAR | Status: AC
  Filled 2018-06-13: qty 20

## 2018-06-13 MED ORDER — FENTANYL CITRATE (PF) 100 MCG/2ML IJ SOLN
INTRAMUSCULAR | Status: AC
  Filled 2018-06-13: qty 2

## 2018-06-13 MED ORDER — VERAPAMIL HCL 2.5 MG/ML IV SOLN
INTRAVENOUS | Status: AC
  Filled 2018-06-13: qty 2

## 2018-06-13 MED ORDER — MIDAZOLAM HCL 2 MG/2ML IJ SOLN
INTRAMUSCULAR | Status: AC | PRN
  Administered 2018-06-13: 1 mg via INTRAVENOUS

## 2018-06-13 MED ORDER — INSULIN ASPART 100 UNIT/ML ~~LOC~~ SOLN
SUBCUTANEOUS | Status: AC
  Filled 2018-06-13: qty 1

## 2018-06-13 MED ORDER — LIDOCAINE HCL (PF) 1 % IJ SOLN
INTRAMUSCULAR | Status: AC | PRN
  Administered 2018-06-13: 5 mL

## 2018-06-13 NOTE — Sedation Documentation (Signed)
MD advised pt not to take Metformin for 48 hours, may resume on Saturday.  Pt verbalizes uinderstanding

## 2018-06-13 NOTE — Procedures (Signed)
S/P bilateral common carotid and RT Vertbral artreriogram. RT Radial approach. Findings. 1.. Dominant Rt [facility] fills the Lt VBJ to the Lt PICA.

## 2018-06-13 NOTE — H&P (Signed)
Chief Complaint: Patient was seen in consultation today for dizziness  Referring Physician(s): Dr. Harrington Challenger  Supervising Physician: Luanne Bras  Patient Status: [facility] - Out-pt  History of Present Illness: Steve Brown is a 78 y.o. male with past medical history of CKD, CAD, GERD, CVA with residual weakness, HTN, DM, and renal insufficiency who has recently developed progressive dizziness and imbalance. He underwent carotid dopplers which showed possible vertebral artery stenosis. This was subsequently followed by an MRA Head which showed: 1. Chronic occlusion of the left vertebral artery with minimal flow related enhancement distally, likely due to collateral flow across the vertebrobasilar confluence. No antegrade flow identified in the left PICA. 2. Normal anterior circulation. 3. Small left cerebellar infarct, unchanged.  He was referred to Neurointerventional Radiology for a diagnostic cerebral angiogram.   He presents today in his usual state of health.  He has been NPO.  He does not take blood thinners.   Past Medical History:  Diagnosis Date  . Abnormal prostate biopsy   . Anticoagulant long-term use    currently xarelto  . BPH with elevated PSA   . CKD (chronic kidney disease), stage II   . Complication of anesthesia    limted neck rom limited use of left arm due to cva  . Coronary artery disease    CARDIOLOGIST-  DR Irish Lack--  2010-- PCI w/ stenting midLAD  . DDD (degenerative disc disease), lumbar   . Degeneration of cervical intervertebral disc   . Depression   . Dyspnea on exertion   . GERD (gastroesophageal reflux disease)   . Hemiparesis due to cerebral infarction   . History of cerebrovascular accident (CVA) with residual deficit 2002 and 2003--  hemiparisis both sides   per MRI  anterior left frontal lobe, left para midline pons, and inferior cerebullam bilaterally infarcts  . History of pulmonary embolus (PE)    06-30-2012  extensive  bilaterally  . History of recurrent TIAs   . History of syncope    hx multiple pre-syncope and syncopal episodes due to vasovagal, orthostatic hypotension, dehydration  . History of TIAs    several since 2002  . Hyperlipidemia   . Hypertension   . Mild atherosclerosis of carotid artery, bilateral    per last duplex 11-04-2014  bilateral ICA 1--39%  . Neuropathy    fingers  . OSA on CPAP    followed by dr dohmeier--  sev. osa w/ AHI 65.9  . Prostate cancer ([place]) dx 2018  . Renal insufficiency   . S/P coronary artery stent placement 2010   stenting to mid LAD  . Simple renal cyst    bilaterally  . Stroke ([place])   . Trigger finger of both hands 11-17-13  . Type 2 diabetes mellitus ([place]) dx 1986   last one A1c 9.2 on 04-26-2016  . Unsteady gait    . Hx prior CVA/TIAs;  . Vertebral artery occlusion, left    chronic    Past Surgical History:  Procedure Laterality Date  . ANTERIOR CERVICAL DECOMP/DISCECTOMY FUSION  2004   C3 -- C6 limited rom  . CARDIAC CATHETERIZATION  06-10-2010   dr Irish Lack   wide patent LAD stent, mid lesion at the origin of the septal prior to the previous stent 40-50%/  normal LVF, ef 55%  . CARDIOVASCULAR STRESS TEST  10-23-2012  dr Irish Lack   normal nuclear perfusion study w/ no ischemia/  normal LV function and wall motion , ef 65%  . CARPAL TUNNEL RELEASE Bilateral   .  CATARACT EXTRACTION W/ INTRAOCULAR LENS  IMPLANT, BILATERAL    . CHOLECYSTECTOMY N/A 11/02/2015   Procedure: LAPAROSCOPIC CHOLECYSTECTOMY WITH INTRAOPERATIVE CHOLANGIOGRAM;  Surgeon: Donnie Mesa, MD;  Location: [place];  Service: General;  Laterality: N/A;  . COLONOSCOPY    . CORONARY ANGIOPLASTY WITH STENT PLACEMENT  02/2008   stenting to mid LAD  . GOLD SEED IMPLANT N/A 11/15/2016   Procedure: GOLD SEED IMPLANT TIMES THREE;  Surgeon: Ardis Hughs, MD;  [facility];  Service: Urology;  Laterality: N/A;  . LEFT HEART CATH AND CORONARY ANGIOGRAPHY N/A  05/25/2017   Procedure: LEFT HEART CATH AND CORONARY ANGIOGRAPHY;  Surgeon: Jettie Booze, MD;  Location: [place] CV [facility];  Service: Cardiovascular;  Laterality: N/A;  . LEFT HEART CATHETERIZATION WITH CORONARY ANGIOGRAM N/A 04/03/2013   Procedure: LEFT HEART CATHETERIZATION WITH CORONARY ANGIOGRAM;  Surgeon: Jettie Booze, MD;  Location: [facility] CATH LAB;  Service: Cardiovascular;  Laterality: N/A;  patent mLAD stent  w/ mild disease in remainder LAD and its branches;  mod. focal lesion midLCFx- FFR of lesion was negative for ischemia/  normal LVSF, ef 50%  . lungs  2005   "fluid pumped off lungs"  . NEUROPLASTY / TRANSPOSITION ULNAR NERVE AT ELBOW Right 2004  . PROSTATE BIOPSY N/A 08/31/2016   Procedure: PROSTATE  BIOPSY TRANSRECTAL ULTRASONIC PROSTATE (TUBP);  Surgeon: Ardis Hughs, MD;  Location: [facility];  Service: Urology;  Laterality: N/A;  . SPACE OAR INSTILLATION N/A 11/15/2016   Procedure: SPACE OAR INSTILLATION;  Surgeon: Ardis Hughs, MD;  Location: [facility];  Service: Urology;  Laterality: N/A;  . TRANSTHORACIC ECHOCARDIOGRAM  04/27/2016   severe focal basal LVH, ef 60-65%,  grade 2 diastoilc dysfunction/  mild AR, MR, and TR/  atrial septum lipomatous hypertrophy/  PASP 41mmHg  . UMBILICAL HERNIA REPAIR      Allergies: Other and Phenergan [promethazine]  Medications: Prior to Admission medications   Medication Sig Start Date End Date Taking? Authorizing Provider  acetaminophen (TYLENOL) 500 MG tablet Take 1,000 mg by mouth every 6 (six) hours as needed (pain).    Yes [provider]  Ascorbic Acid (VITAMIN C PO) Take 1 tablet by mouth daily.   Yes [provider]  BD PEN NEEDLE NANO U/F 32G X 4 MM MISC by Does not apply route.  09/01/13  Yes [provider]  bisacodyl (DULCOLAX) 5 MG EC tablet Take 5 mg by mouth daily as needed for moderate constipation.   Yes [provider]   cholecalciferol (VITAMIN D) 1000 UNITS tablet Take 1,000 Units by mouth daily.   Yes [provider]  diclofenac sodium (VOLTAREN) 1 % GEL Apply 1 application topically See admin instructions. Apply topically once daily , may use an additional time as needed for pain 10/16/16  Yes [provider]  Echinacea 450 MG CAPS Take 450 mg by mouth daily.    Yes [provider]  fluticasone (FLONASE) 50 MCG/ACT nasal spray Place 1 spray into both nostrils 2 (two) times daily.  09/26/16  Yes [provider]  furosemide (LASIX) 20 MG tablet Take 20 mg by mouth daily as needed (leg and ankle swelling).    Yes [provider]  insulin lispro (HUMALOG KWIKPEN) 100 UNIT/ML KiwkPen Inject 6-10 Units into the skin 3 (three) times daily with meals.   Yes [provider]  IRON PO Take 1 tablet by mouth daily.   Yes [provider]  losartan (COZAAR) 100 MG tablet Take 100 mg by mouth daily. 03/29/18  Yes [provider]  metFORMIN (GLUCOPHAGE) 500 MG tablet Take 500 mg by mouth 2 (two) times daily as needed (CBG >200).    Yes [provider]  Multiple Vitamin (MULTIVITAMIN WITH MINERALS) TABS tablet Take 1 tablet by mouth daily.   Yes [provider]  pregabalin (LYRICA) 300 MG capsule Take 1 capsule (300 mg total) by mouth 2 (two) times daily. 07/26/12  Yes Dohmeier, Asencion Partridge, MD  rosuvastatin (CRESTOR) 10 MG tablet Take 10 mg by mouth at bedtime.    Yes [provider]  tamsulosin (FLOMAX) 0.4 MG CAPS capsule Take 0.4 mg by mouth daily. 05/09/16  Yes [provider]  TRESIBA FLEXTOUCH 100 UNIT/ML SOPN FlexTouch Pen Inject 20 Units into the skin every morning. 05/11/18  Yes [provider]  valACYclovir (VALTREX) 500 MG tablet Take 500 mg by mouth daily.   Yes [provider]  albuterol (PROVENTIL HFA;VENTOLIN HFA) 108 (90 Base) MCG/ACT inhaler Inhale 2 puffs into the lungs every 6 (six) hours as needed  for wheezing or shortness of breath.    [provider]  meclizine (ANTIVERT) 25 MG tablet Take 12.5-25 mg by mouth 3 (three) times daily as needed for dizziness.    [provider]  nitroGLYCERIN (NITROSTAT) 0.4 MG SL tablet Place 1 tablet (0.4 mg total) under the tongue every 5 (five) minutes as needed for chest pain. 12/07/15   Jettie Booze, MD  pantoprazole (PROTONIX) 40 MG tablet Take 40 mg by mouth daily as needed (acid reflux/heartburn).     [provider]  rivaroxaban (XARELTO) 20 MG TABS tablet Take 20 mg by mouth daily with supper.    [provider]     Family History  Problem Relation Age of Onset  . Aneurysm Mother   . Cancer Father        unknown either pancreatic or prostate  . Heart attack Neg Hx     Social History   Socioeconomic History  . Marital status: Divorced    Spouse name: Not on file  . Number of children: 4  . Years of education: 35  . Highest education level: Not on file  Occupational History    Employer: RETIRED    Comment: retired  Scientific laboratory technician  . Financial resource strain: Not on file  . Food insecurity:    Worry: Not on file    Inability: Not on file  . Transportation needs:    Medical: Not on file    Non-medical: Not on file  Tobacco Use  . Smoking status: Never Smoker  . Smokeless tobacco: Never Used  Substance and Sexual Activity  . Alcohol use: No    Alcohol/week: 0.0 standard drinks  . Drug use: No  . Sexual activity: Yes  Lifestyle  . Physical activity:    Days per week: Not on file    Minutes per session: Not on file  . Stress: Not on file  Relationships  . Social connections:    Talks on phone: Not on file    Gets together: Not on file    Attends religious service: Not on file    Active member of club or organization: Not on file    Attends meetings of clubs or organizations: Not on file    Relationship status: Not on file  Other Topics Concern  . Not on file  Social History  Narrative   Patient lives at home  alone and he is single.  Patient is retired.    Caffeine - one cups daily.   Right handed.   Patient has a high school education.   Patient has four adult children.     Review of Systems: A 12 point ROS discussed and pertinent positives are indicated in the HPI above.  All other systems are negative.  Review of Systems  Constitutional: Negative for fatigue and fever.  Respiratory: Negative for cough and shortness of breath.   Cardiovascular: Negative for chest pain.  Gastrointestinal: Negative for abdominal pain, nausea and vomiting.  Genitourinary: Negative for dysuria.  Musculoskeletal: Negative for back pain.  Neurological: Positive for dizziness and weakness.  Psychiatric/Behavioral: Negative for behavioral problems and confusion.    Vital Signs: BP (!) 154/97   Pulse 71   Temp 97.7 F (36.5 C) (Oral)   Resp 16   Ht 5\' 5"  (1.651 m)   Wt 200 lb (90.7 kg)   SpO2 100%   BMI 33.28 kg/m   Physical Exam Vitals signs and nursing note reviewed.  Constitutional:      Appearance: Normal appearance.  HENT:     Mouth/Throat:     Mouth: Mucous membranes are moist.     Pharynx: Oropharynx is clear.  Eyes:     Extraocular Movements: Extraocular movements intact.     Pupils: Pupils are equal, round, and reactive to light.  Cardiovascular:     Rate and Rhythm: Normal rate and regular rhythm.  Pulmonary:     Effort: Pulmonary effort is normal. No respiratory distress.     Breath sounds: Normal breath sounds.  Abdominal:     General: Abdomen is flat.     Palpations: Abdomen is soft.  Skin:    General: Skin is warm and dry.  Neurological:     General: No focal deficit present.     Mental Status: He is alert and oriented to person, place, and time. Mental status is at baseline.  Psychiatric:        Mood and Affect: Mood normal.        Behavior: Behavior normal.        Thought Content: Thought content normal.        Judgment: Judgment  normal.      MD Evaluation Airway: WNL Heart: WNL Abdomen: WNL Chest/ Lungs: WNL ASA  Classification: 3 Mallampati/Airway Score: Three   Imaging: No results found.  Labs:  CBC: Recent Labs    09/09/17 1524  WBC 7.4  HGB 14.0  HCT 39.5  PLT 200    COAGS: No results for input(s): INR, APTT in the last 8760 hours.  BMP: Recent Labs    09/09/17 1524  NA 137  K 4.7  CL 100  CO2 28  GLUCOSE 212*  BUN 36*  CALCIUM 9.1  CREATININE 1.44*  GFRNONAA 46*  GFRAA 53*    LIVER FUNCTION TESTS: No results for input(s): BILITOT, AST, ALT, ALKPHOS, PROT, ALBUMIN in the last 8760 hours.  TUMOR MARKERS: No results for input(s): AFPTM, CEA, CA199, CHROMGRNA in the last 8760 hours.  Assessment and Plan: Patient with past medical history of CVA, HTN, CAD presents with complaint of dizziness, imbalance.  IR consulted for diagnostic angiogram at the request of Dr. Harrington Challenger. Case reviewed by Dr. Estanislado Pandy who approves patient for procedure.  Patient presents today in their usual state of health.  He has been NPO and is not currently on blood thinners.    Risks and benefits of angiogram were  discussed with the patient including, but not limited to bleeding, infection, vascular injury or contrast induced renal failure.  This interventional procedure involves the use of X-rays and because of the nature of the planned procedure, it is possible that we will have prolonged use of X-ray fluoroscopy.  Potential radiation risks to you include (but are not limited to) the following: - A slightly elevated risk for cancer  several years later in life. This risk is typically less than 0.5% percent. This risk is low in comparison to the normal incidence of human cancer, which is 33% for women and 50% for men according to the [place]. - Radiation induced injury can include skin redness, resembling a rash, tissue breakdown / ulcers and hair loss (which can be temporary or  permanent).   The likelihood of either of these occurring depends on the difficulty of the procedure and whether you are sensitive to radiation due to previous procedures, disease, or genetic conditions.   IF your procedure requires a prolonged use of radiation, you will be notified and given written instructions for further action.  It is your responsibility to monitor the irradiated area for the 2 weeks following the procedure and to notify your physician if you are concerned that you have suffered a radiation induced injury.    All of the patient's questions were answered, patient is agreeable to proceed.  Consent signed and in chart.  Thank you for this interesting consult.  I greatly enjoyed meeting Kylan Liberati and look forward to participating in their care.  A copy of this report was sent to the requesting provider on this date.  Electronically Signed: Docia Barrier, PA 06/13/2018, 8:06 AM   I spent a total of  30 Minutes   in face to face in clinical consultation, greater than 50% of which was counseling/coordinating care for dizziness.

## 2018-06-13 NOTE — Sedation Documentation (Signed)
O2 [place] / ETCO2 monitor removed per md

## 2018-06-13 NOTE — Discharge Instructions (Signed)
Radial Site Care ° °This sheet gives you information about how to care for yourself after your procedure. Your health care provider may also give you more specific instructions. If you have problems or questions, contact your health care provider. °What can I expect after the procedure? °After the procedure, it is common to have: °· Bruising and tenderness at the catheter insertion area. °Follow these instructions at home: °Medicines °· Take over-the-counter and prescription medicines only as told by your health care provider. °Insertion site care °· Follow instructions from your health care provider about how to take care of your insertion site. Make sure you: °? Wash your hands with soap and water before you change your bandage (dressing). If soap and water are not available, use hand sanitizer. °? Change your dressing as told by your health care provider. °? Leave stitches (sutures), skin glue, or adhesive strips in place. These skin closures may need to stay in place for 2 weeks or longer. If adhesive strip edges start to loosen and curl up, you may trim the loose edges. Do not remove adhesive strips completely unless your health care provider tells you to do that. °· Check your insertion site every day for signs of infection. Check for: °? Redness, swelling, or pain. °? Fluid or blood. °? Pus or a bad smell. °? Warmth. °· Do not take baths, swim, or use a hot tub until your health care provider approves. °· You may shower 24-48 hours after the procedure, or as directed by your health care provider. °? Remove the dressing and gently wash the site with plain soap and water. °? Pat the area dry with a clean towel. °? Do not rub the site. That could cause bleeding. °· Do not apply powder or lotion to the site. °Activity ° °· For 24 hours after the procedure, or as directed by your health care provider: °? Do not flex or bend the affected arm. °? Do not push or pull heavy objects with the affected arm. °? Do not  drive yourself home from the hospital or clinic. You may drive 24 hours after the procedure unless your health care provider tells you not to. °? Do not operate machinery or power tools. °· Do not lift anything that is heavier than 10 lb (4.5 kg), or the limit that you are told, until your health care provider says that it is safe. °· Ask your health care provider when it is okay to: °? Return to work or school. °? Resume usual physical activities or sports. °? Resume sexual activity. °General instructions °· If the catheter site starts to bleed, raise your arm and put firm pressure on the site. If the bleeding does not stop, get help right away. This is a medical emergency. °· If you went home on the same day as your procedure, a responsible adult should be with you for the first 24 hours after you arrive home. °· Keep all follow-up visits as told by your health care provider. This is important. °Contact a health care provider if: °· You have a fever. °· You have redness, swelling, or yellow drainage around your insertion site. °Get help right away if: °· You have unusual pain at the radial site. °· The catheter insertion area swells very fast. °· The insertion area is bleeding, and the bleeding does not stop when you hold steady pressure on the area. °· Your arm or hand becomes pale, cool, tingly, or numb. °These symptoms may represent a serious problem   that is an emergency. Do not wait to see if the symptoms will go away. Get medical help right away. Call your local emergency services (911 in the [place] Do not drive yourself to the hospital. °Summary °· After the procedure, it is common to have bruising and tenderness at the site. °· Follow instructions from your health care provider about how to take care of your radial site wound. Check the wound every day for signs of infection. °· Do not lift anything that is heavier than 10 lb (4.5 kg), or the limit that you are told, until your health care provider says  that it is safe. °This information is not intended to replace advice given to you by your health care provider. Make sure you discuss any questions you have with your health care provider. °Document Released: 01/28/2010 Document Revised: 01/31/2017 Document Reviewed: 01/31/2017 °[employer] Interactive Patient Education © 2019 [employer] °Cerebral Angiogram ° °A cerebral angiogram is a procedure that is used to examine the blood vessels in the brain and neck. In this procedure, contrast dye is injected through a long, thin tube (catheter) into an artery. X-rays are then taken, which show if there is a blockage or problem in a blood vessel. °Tell a health care provider about: °· Any allergies you have, including allergies to shellfish, contrast dye, or iodine °· All medicines you are taking, including vitamins, herbs, eye drops, creams, and over-the-counter medicines. °· Any problems you or family members have had with anesthetic medicines. °· Any blood disorders you have. °· Any surgeries you have had. °· Any medical conditions you have. °· Whether you are pregnant or may be pregnant. °· Whether you are currently breastfeeding. °What are the risks? °Generally, this is a safe procedure. However, problems may occur, including: °· Damage to surrounding nerves, tissues, or structures. °· Blood clot. °· Inability to remember what happened (amnesia). This is usually temporary. °· Weakness, numbness, speech, or vision problems. This is usually temporary. °· Stroke. °· Kidney injury. °· Bleeding or bruising. °· Allergic reaction medicines or dyes. °· Infection. °What happens before the procedure? °Staying hydrated °Follow instructions from your health care provider about hydration, which may include: °· Up to 2 hours before the procedure - you may continue to drink clear liquids, such as water, clear fruit juice, black coffee, and plain tea. °Eating and drinking restrictions °Follow instructions from your health care  provider about eating and drinking, which may include: °· 8 hours before the procedure - stop eating heavy meals or foods such as meat, fried foods, or fatty foods. °· 6 hours before the procedure - stop eating light meals or foods, such as toast or cereal. °· 6 hours before the procedure - stop drinking milk or drinks that contain milk. °· 2 hours before the procedure - stop drinking clear liquids. °General instructions °· Ask your health care provider about: °? Changing or stopping your regular medicines. This is especially important if you are taking diabetes medicines or blood thinners. °? Taking medicines such as aspirin or ibuprofen. These medicines can thin your blood. Do not take these medicines before your procedure if your health care provider asks you not to. °· You may have blood tests done. °· Plan to have someone take you home from the hospital or clinic. °· If you will be going home right after the procedure, plan to have someone with you for 24 hours. °What happens during the procedure? °· To reduce your risk of infection: °? Your health   care team will wash or sanitize their hands. °? Your skin will be washed with soap. °? Hair may be removed from the surgical area. °· You will lie on your back on an imaging bed with an X-ray machine around you. °· Your head will be secured to the bed with a strap or device to help you keep still. °· An IV tube will be inserted into one of your veins. °· You will be given one or more of the following: °? A medicine to help you relax (sedative). °? A medicine to numb the area (local anesthetic) where the catheter will be inserted. This is usually your groin, leg, or arm. °· Your heart rate and other vital signs will be watched carefully. You may have electrodes placed on your chest. °· A small cut (incision) will be made where the catheter will be inserted. °· The catheter will be inserted into an artery that leads to the head. You may feel slight pressure. °· The  catheter will be moved through the body up to your neck and brain. X-ray images will help your health care provider bring the catheter to the correct location. °· The dye will be injected into the catheter and will travel to your head or neck area. You may feel a warming or burning sensation or notice a strange taste in your mouth as the dye goes through your system. °· Images will be taken to show how the dye flows through the area. °· While the images are being taken, you may be given instructions on breathing, swallowing, moving, or talking. °· When the images are finished, the catheter will be slowly removed. °· Pressure will be applied to the skin to stop any bleeding. A tight bandage (dressing) or seal will be applied to the skin. °· Your IV will be removed. °The procedure may vary among health care providers and hospitals. °What happens after the procedure? °· Your blood pressure, heart rate, breathing rate, and blood oxygen level will be monitored until the medicines you were given have worn off. °· You will be asked to lie flat for several hours. The arm or leg where the catheter was inserted will need to be kept straight while you are in the recovery room. °· The insertion site will be watched for bleeding and you will be checked often. °· You will be instructed to drink plenty of fluids. This will help wash the contrast dye out of your system. °· Do not drive for 24 hours if you received a sedative. °· It is up to you to get the results of your procedure. Ask your health care provider, or the department that is doing the procedure, when your results will be ready. °Summary °· A cerebral angiogram is a procedure that is used to examine the blood vessels in the brain and neck. °· In this procedure, contrast dye is injected through a long, thin tube (catheter) into an artery. X-rays are then taken, which show if there is a blockage or problem in a blood vessel. °· You will be given a sedative to help you  relax during the procedure. A local anesthetic will be used to numb the area where the catheter is inserted. You may feel pressure when the catheter is inserted, and you may feel a warm sensation when the dye is injected. °· After the procedure, you will be asked to lie flat for several hours. The arm or leg where the catheter was inserted will need to be kept   straight while you are in the recovery room. °This information is not intended to replace advice given to you by your health care provider. Make sure you discuss any questions you have with your health care provider. °Document Released: 05/12/2013 Document Revised: 01/31/2016 Document Reviewed: 01/31/2016 °[employer] Interactive Patient Education © 2019 [employer] °Moderate Conscious Sedation, Adult, Care After °These instructions provide you with information about caring for yourself after your procedure. Your health care provider may also give you more specific instructions. Your treatment has been planned according to current medical practices, but problems sometimes occur. Call your health care provider if you have any problems or questions after your procedure. °What can I expect after the procedure? °After your procedure, it is common: °· To feel sleepy for several hours. °· To feel clumsy and have poor balance for several hours. °· To have poor judgment for several hours. °· To vomit if you eat too soon. °Follow these instructions at home: °For at least 24 hours after the procedure: ° °· Do not: °? Participate in activities where you could fall or become injured. °? Drive. °? Use heavy machinery. °? Drink alcohol. °? Take sleeping pills or medicines that cause drowsiness. °? Make important decisions or sign legal documents. °? Take care of children on your own. °· Rest. °Eating and drinking °· Follow the diet recommended by your health care provider. °· If you vomit: °? Drink water, juice, or soup when you can drink without vomiting. °? Make sure you  have little or no nausea before eating solid foods. °General instructions °· Have a responsible adult stay with you until you are awake and alert. °· Take over-the-counter and prescription medicines only as told by your health care provider. °· If you smoke, do not smoke without supervision. °· Keep all follow-up visits as told by your health care provider. This is important. °Contact a health care provider if: °· You keep feeling nauseous or you keep vomiting. °· You feel light-headed. °· You develop a rash. °· You have a fever. °Get help right away if: °· You have trouble breathing. °This information is not intended to replace advice given to you by your health care provider. Make sure you discuss any questions you have with your health care provider. °Document Released: 10/16/2012 Document Revised: 05/31/2015 Document Reviewed: 04/17/2015 °[employer] Interactive Patient Education © 2019 [employer] ° °

## 2018-06-14 ENCOUNTER — Other Ambulatory Visit (HOSPITAL_COMMUNITY): Payer: Self-pay | Admitting: Interventional Radiology

## 2018-06-14 ENCOUNTER — Encounter (HOSPITAL_COMMUNITY): Payer: Self-pay | Admitting: Interventional Radiology

## 2018-06-14 DIAGNOSIS — R42 Dizziness and giddiness: Secondary | ICD-10-CM

## 2018-06-14 DIAGNOSIS — R930 Abnormal findings on diagnostic imaging of skull and head, not elsewhere classified: Secondary | ICD-10-CM

## 2018-06-17 ENCOUNTER — Telehealth (HOSPITAL_COMMUNITY): Payer: Self-pay

## 2018-06-17 ENCOUNTER — Encounter (HOSPITAL_COMMUNITY): Payer: Self-pay | Admitting: *Deleted

## 2018-06-17 ENCOUNTER — Emergency Department (HOSPITAL_COMMUNITY)
Admission: EM | Admit: 2018-06-17 | Discharge: 2018-06-18 | Disposition: A | Discharge status: Home / Self Care | Payer: Medicare Other | Attending: Emergency Medicine | Admitting: Emergency Medicine

## 2018-06-17 ENCOUNTER — Other Ambulatory Visit: Payer: Self-pay

## 2018-06-17 ENCOUNTER — Emergency Department (HOSPITAL_COMMUNITY): Payer: Medicare Other

## 2018-06-17 DIAGNOSIS — Z7984 Long term (current) use of oral hypoglycemic drugs: Secondary | ICD-10-CM | POA: Insufficient documentation

## 2018-06-17 DIAGNOSIS — Z8673 Personal history of transient ischemic attack (TIA), and cerebral infarction without residual deficits: Secondary | ICD-10-CM | POA: Diagnosis not present

## 2018-06-17 DIAGNOSIS — Z79899 Other long term (current) drug therapy: Secondary | ICD-10-CM | POA: Diagnosis not present

## 2018-06-17 DIAGNOSIS — R079 Chest pain, unspecified: Secondary | ICD-10-CM | POA: Diagnosis not present

## 2018-06-17 DIAGNOSIS — E1122 Type 2 diabetes mellitus with diabetic chronic kidney disease: Secondary | ICD-10-CM | POA: Insufficient documentation

## 2018-06-17 DIAGNOSIS — I2510 Atherosclerotic heart disease of native coronary artery without angina pectoris: Secondary | ICD-10-CM | POA: Diagnosis not present

## 2018-06-17 DIAGNOSIS — I129 Hypertensive chronic kidney disease with stage 1 through stage 4 chronic kidney disease, or unspecified chronic kidney disease: Secondary | ICD-10-CM | POA: Insufficient documentation

## 2018-06-17 DIAGNOSIS — M79602 Pain in left arm: Secondary | ICD-10-CM | POA: Insufficient documentation

## 2018-06-17 DIAGNOSIS — N182 Chronic kidney disease, stage 2 (mild): Secondary | ICD-10-CM | POA: Insufficient documentation

## 2018-06-17 LAB — CBC
HCT: 38.6 % — ABNORMAL LOW (ref 39.0–52.0)
Hemoglobin: 13.2 g/dL (ref 13.0–17.0)
MCH: 31.9 pg (ref 26.0–34.0)
MCHC: 34.2 g/dL (ref 30.0–36.0)
MCV: 93.2 fL (ref 80.0–100.0)
Platelets: 178 10*3/uL (ref 150–400)
RBC: 4.14 MIL/uL — ABNORMAL LOW (ref 4.22–5.81)
RDW: 12.5 % (ref 11.5–15.5)
WBC: 4.6 10*3/uL (ref 4.0–10.5)
nRBC: 0 % (ref 0.0–0.2)

## 2018-06-17 LAB — BASIC METABOLIC PANEL
Anion gap: 12 (ref 5–15)
BUN: 24 mg/dL — ABNORMAL HIGH (ref 8–23)
CO2: 21 mmol/L — ABNORMAL LOW (ref 22–32)
Calcium: 9.1 mg/dL (ref 8.9–10.3)
Chloride: 103 mmol/L (ref 98–111)
Creatinine, Ser: 1.26 mg/dL — ABNORMAL HIGH (ref 0.61–1.24)
GFR calc Af Amer: >60 mL/min (ref >60)
GFR calc non Af Amer: 55 mL/min — ABNORMAL LOW (ref >60)
Glucose, Bld: 189 mg/dL — ABNORMAL HIGH (ref 70–99)
Potassium: 4.7 mmol/L (ref 3.5–5.1)
Sodium: 136 mmol/L (ref 135–145)

## 2018-06-17 LAB — TROPONIN I: Troponin I: <0.03 ng/mL (ref <0.03)

## 2018-06-17 MED ORDER — SODIUM CHLORIDE 0.9% FLUSH: 3.0000 mL | Freq: Once | INTRAVENOUS | Status: DC

## 2018-06-17 NOTE — ED Triage Notes (Signed)
Pt in c/o chest pain and weakness for the last few days, denies shortness of breath, pain is to left side of chest and radiates down his left arm, no distress noted

## 2018-06-17 NOTE — Telephone Encounter (Signed)
Pt called complaining of new sx since his angiogram on Thursday. He says the opposite arm has been weak and his is having some dizziness. He walked to the mailbox earlier and said he almost didn't make it back to the house he was so weak. I gave this information to our PA and she spoke with Dr. Estanislado Pandy. They advised pt to go to the ER since those are new sx of weakness and dizziness. Pt agreed with this plan and will get someone to bring him in. AW

## 2018-06-18 DIAGNOSIS — M79602 Pain in left arm: Secondary | ICD-10-CM | POA: Diagnosis not present

## 2018-06-18 LAB — CBG MONITORING, ED: Glucose-Capillary: 120 mg/dL — ABNORMAL HIGH (ref 70–99)

## 2018-06-18 NOTE — ED Notes (Signed)
Patient verbalizes understanding of discharge instructions. Opportunity for questioning and answers were provided. Armband removed by staff, pt discharged from ED.  

## 2018-06-18 NOTE — ED Provider Notes (Signed)
[facility] Provider Note   CSN: [id number] Arrival date & time: 06/17/18  1658    History   Chief Complaint Chief Complaint  Patient presents with  . Chest Pain    HPI Steve Brown is a 78 y.o. male.     The history is provided by the patient.  Chest Pain  Chest pain location: Left arm. Pain quality: aching   Pain radiates to:  Does not radiate Pain severity:  Mild Onset quality:  Gradual Timing:  Constant Progression:  Unchanged Chronicity:  New Relieved by:  Nothing Worsened by:  Nothing Associated symptoms: no abdominal pain, no cough, no diaphoresis, no fatigue, no fever, no nausea, no shortness of breath, no vomiting and no weakness   Patient reports left arm pain for several days.  Denies chest pain.  Denies shortness of breath, denies diaphoresis.  Reports he was concerned it may be his heart.  No back pain.  He reports he does have problems with his neck from previous surgery, and at times he will have pain in the left arm.  He did have an IR procedure last week, but they approached this via his right arm.  Past Medical History:  Diagnosis Date  . Abnormal prostate biopsy   . Anticoagulant long-term use    currently xarelto  . BPH with elevated PSA   . CKD (chronic kidney disease), stage II   . Complication of anesthesia    limted neck rom limited use of left arm due to cva  . Coronary artery disease    CARDIOLOGIST-  DR Irish Lack--  2010-- PCI w/ stenting midLAD  . DDD (degenerative disc disease), lumbar   . Degeneration of cervical intervertebral disc   . Depression   . Dyspnea on exertion   . GERD (gastroesophageal reflux disease)   . Hemiparesis due to cerebral infarction   . History of cerebrovascular accident (CVA) with residual deficit 2002 and 2003--  hemiparisis both sides   per MRI  anterior left frontal lobe, left para midline pons, and inferior cerebullam bilaterally infarcts  . History of pulmonary  embolus (PE)    06-30-2012  extensive bilaterally  . History of recurrent TIAs   . History of syncope    hx multiple pre-syncope and syncopal episodes due to vasovagal, orthostatic hypotension, dehydration  . History of TIAs    several since 2002  . Hyperlipidemia   . Hypertension   . Mild atherosclerosis of carotid artery, bilateral    per last duplex 11-04-2014  bilateral ICA 1--39%  . Neuropathy    fingers  . OSA on CPAP    followed by dr dohmeier--  sev. osa w/ AHI 65.9  . Prostate cancer ([place]) dx 2018  . Renal insufficiency   . S/P coronary artery stent placement 2010   stenting to mid LAD  . Simple renal cyst    bilaterally  . Stroke ([place])   . Trigger finger of both hands 11-17-13  . Type 2 diabetes mellitus ([place]) dx 1986   last one A1c 9.2 on 04-26-2016  . Unsteady gait    . Hx prior CVA/TIAs;  . Vertebral artery occlusion, left    chronic    Patient Active Problem List   Diagnosis Date Noted  . Radiation therapy complication 25/42/7062  . Primary prostate cancer ([place]) 07/31/2017  . Anemia 07/31/2017  . Vertigo due to cerebrovascular disease 07/31/2017  . Poor compliance with CPAP treatment 07/31/2017  . Absolute anemia 05/16/2017  .  Avitaminosis D 05/16/2017  . Benign essential HTN 05/16/2017  . Benign prostatic hypertrophy without urinary obstruction 05/16/2017  . Clinical depression 05/16/2017  . CN (constipation) 05/16/2017  . Current drug use 05/16/2017  . Diabetic neuropathy ([place]) 05/16/2017  . Genital herpes 05/16/2017  . Infarction of lung due to iatrogenic pulmonary embolism ([place]) 05/16/2017  . Peripheral neuralgia 05/16/2017  . Arteriosclerosis of coronary artery 05/16/2017  . Artery disease, cerebral 05/16/2017  . Apnea, sleep 05/16/2017  . Arthralgia of hip or thigh 05/16/2017  . Malignant neoplasm of prostate ([place]) 09/28/2016  . Vascular dementia in remission ([place]) 09/28/2016  . Remote history of stroke 09/28/2016  . Acute kidney injury ([place])  05/18/2016  . Dehydration 05/18/2016  . Near syncope 05/18/2016  . Orthostatic hypotension 05/18/2016  . CKD (chronic kidney disease), stage II 04/26/2016  . UTI (urinary tract infection) 04/26/2016  . Encounter for counseling on use of CPAP 11/18/2015  . Chronic cholecystitis with calculus 11/02/2015  . Stroke, 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[place]) 04/22/2015  . TIA (transient ischemic attack) 11/03/2014  . OSA on CPAP 05/18/2014  . White matter disease 08/14/2013  . OSA (obstructive sleep apnea) 08/14/2013  . Abnormal x-ray of temporomandibular joint 05/30/2013  . Chronic infection of sinus 05/30/2013  . Cough 05/30/2013  . Fatigue 05/30/2013  . Unsteady gait 05/19/2013  . Combined fat and carbohydrate induced hyperlipemia 04/24/2013  . Syncope 03/20/2013  . Angina pectoris ([place]) 10/08/2012  . Hypertension   . Stroke ([place])   . History of TIAs   . Lumbago   . Other and unspecified hyperlipidemia   . Personal history of unspecified circulatory disease   . Unspecified fall   . Pain in joint, multiple sites   . Degeneration of cervical intervertebral disc   . Unspecified cardiovascular disease   . History of pulmonary embolism: June 2014,  Takes Xarelto 07/01/2012    Class: History of  . Hemiparesis ([place]) 07/18/2011  . Diabetes mellitus type 2 with complications ([place]) 40/98/1191  . HTN (hypertension) 07/18/2011  . CAD in native artery 07/18/2011  . History of recurrent TIAs 07/18/2011    Past Surgical History:  Procedure Laterality Date  . ANTERIOR CERVICAL DECOMP/DISCECTOMY FUSION  2004   C3 -- C6 limited rom  . CARDIAC CATHETERIZATION  06-10-2010   dr Irish Lack   wide patent LAD stent, mid lesion at the origin of the septal prior to the previous stent 40-50%/  normal LVF, ef 55%  . CARDIOVASCULAR STRESS TEST  10-23-2012  dr Irish Lack   normal nuclear perfusion study w/ no ischemia/  normal LV function and wall motion , ef 65%  . CARPAL TUNNEL RELEASE Bilateral   . CATARACT EXTRACTION  W/ INTRAOCULAR LENS  IMPLANT, BILATERAL    . CHOLECYSTECTOMY N/A 11/02/2015   Procedure: LAPAROSCOPIC CHOLECYSTECTOMY WITH INTRAOPERATIVE CHOLANGIOGRAM;  Surgeon: Donnie Mesa, MD;  Location: [facility];  Service: General;  Laterality: N/A;  . COLONOSCOPY    . CORONARY ANGIOPLASTY WITH STENT PLACEMENT  02/2008   stenting to mid LAD  . GOLD SEED IMPLANT N/A 11/15/2016   Procedure: GOLD SEED IMPLANT TIMES THREE;  Surgeon: Ardis Hughs, MD;  Location[facility];  Service: Urology;  Laterality: N/A;  . IR ANGIO INTRA EXTRACRAN SEL COM CAROTID INNOMINATE BILAT MOD SED  06/13/2018  . IR ANGIO VERTEBRAL SEL VERTEBRAL UNI R MOD SED  06/13/2018  . IR US GUIDE VASC ACCESS RIGHT  06/13/2018  . LEFT HEART CATH AND CORONARY ANGIOGRAPHY N/A 05/25/2017  Procedure: LEFT HEART CATH AND CORONARY ANGIOGRAPHY;  Surgeon: Jettie Booze, MD;  Location: [facility] CV LAB;  Service: Cardiovascular;  Laterality: N/A;  . LEFT HEART CATHETERIZATION WITH CORONARY ANGIOGRAM N/A 04/03/2013   Procedure: LEFT HEART CATHETERIZATION WITH CORONARY ANGIOGRAM;  Surgeon: Jettie Booze, MD;  Location: [facility] CATH LAB;  Service: Cardiovascular;  Laterality: N/A;  patent mLAD stent  w/ mild disease in remainder LAD and its branches;  mod. focal lesion midLCFx- FFR of lesion was negative for ischemia/  normal LVSF, ef 50%  . lungs  2005   "fluid pumped off lungs"  . NEUROPLASTY / TRANSPOSITION ULNAR NERVE AT ELBOW Right 2004  . PROSTATE BIOPSY N/A 08/31/2016   Procedure: PROSTATE  BIOPSY TRANSRECTAL ULTRASONIC PROSTATE (TUBP);  Surgeon: Ardis Hughs, MD;  Location[facility];  Service: Urology;  Laterality: N/A;  . SPACE OAR INSTILLATION N/A 11/15/2016   Procedure: SPACE OAR INSTILLATION;  Surgeon: Ardis Hughs, MD;  Location: [facility];  Service: Urology;  Laterality: N/A;  . TRANSTHORACIC ECHOCARDIOGRAM  04/27/2016   severe focal basal LVH, ef 60-65%,  grade 2  diastoilc dysfunction/  mild AR, MR, and TR/  atrial septum lipomatous hypertrophy/  PASP 35mmHg  . UMBILICAL HERNIA REPAIR          Home Medications    Prior to Admission medications   Medication Sig Start Date End Date Taking? Authorizing Provider  acetaminophen (TYLENOL) 500 MG tablet Take 1,000 mg by mouth every 6 (six) hours as needed (pain).    Yes [provider]  albuterol (PROVENTIL HFA;VENTOLIN HFA) 108 (90 Base) MCG/ACT inhaler Inhale 2 puffs into the lungs every 6 (six) hours as needed for wheezing or shortness of breath.   Yes [provider]  Ascorbic Acid (VITAMIN C PO) Take 1 tablet by mouth daily.   Yes [provider]  bisacodyl (DULCOLAX) 5 MG EC tablet Take 5 mg by mouth daily as needed for moderate constipation.   Yes [provider]  cholecalciferol (VITAMIN D) 1000 UNITS tablet Take 1,000 Units by mouth daily.   Yes [provider]  diclofenac sodium (VOLTAREN) 1 % GEL Apply 1 application topically See admin instructions. Apply topically once daily , may use an additional time as needed for pain 10/16/16  Yes [provider]  Echinacea 450 MG CAPS Take 450 mg by mouth daily.    Yes [provider]  fluticasone (FLONASE) 50 MCG/ACT nasal spray Place 1 spray into both nostrils 2 (two) times daily as needed for allergies.  09/26/16  Yes [provider]  furosemide (LASIX) 20 MG tablet Take 20 mg by mouth daily as needed (leg and ankle swelling).    Yes [provider]  insulin lispro (HUMALOG KWIKPEN) 100 UNIT/ML KiwkPen Inject 6-10 Units into the skin 3 (three) times daily with meals.   Yes [provider]  IRON PO Take 1 tablet by mouth daily.   Yes [provider]  losartan (COZAAR) 100 MG tablet Take 100 mg by mouth daily. 03/29/18  Yes [provider]  meclizine (ANTIVERT) 25 MG tablet Take 12.5-25 mg by mouth 3 (three) times daily as needed for dizziness.   Yes  [provider]  metFORMIN (GLUCOPHAGE) 500 MG tablet Take 500 mg by mouth 2 (two) times daily as needed (CBG >200).    Yes [provider]  Multiple Vitamin (MULTIVITAMIN WITH MINERALS) TABS tablet Take 1 tablet by mouth daily.   Yes  [provider]  nitroGLYCERIN (NITROSTAT) 0.4 MG SL tablet Place 1 tablet (0.4 mg total) under the tongue every 5 (five) minutes as needed for chest pain. 12/07/15  Yes Jettie Booze, MD  pantoprazole (PROTONIX) 40 MG tablet Take 40 mg by mouth daily as needed (acid reflux/heartburn).    Yes [provider]  pregabalin (LYRICA) 300 MG capsule Take 1 capsule (300 mg total) by mouth 2 (two) times daily. 07/26/12  Yes Dohmeier, Asencion Partridge, MD  rivaroxaban (XARELTO) 20 MG TABS tablet Take 20 mg by mouth daily.    Yes [provider]  rosuvastatin (CRESTOR) 10 MG tablet Take 10 mg by mouth at bedtime.    Yes [provider]  tamsulosin (FLOMAX) 0.4 MG CAPS capsule Take 0.4 mg by mouth daily. 05/09/16  Yes [provider]  TRESIBA FLEXTOUCH 100 UNIT/ML SOPN FlexTouch Pen Inject 20 Units into the skin every morning. 05/11/18  Yes [provider]  valACYclovir (VALTREX) 500 MG tablet Take 500 mg by mouth daily.   Yes [provider]    Family History Family History  Problem Relation Age of Onset  . Aneurysm Mother   . Cancer Father        unknown either pancreatic or prostate  . Heart attack Neg Hx     Social History Social History   Tobacco Use  . Smoking status: Never Smoker  . Smokeless tobacco: Never Used  Substance Use Topics  . Alcohol use: No    Alcohol/week: 0.0 standard drinks  . Drug use: No     Allergies   Other and Phenergan [promethazine]   Review of Systems Review of Systems  Constitutional: Negative for diaphoresis, fatigue and fever.  Respiratory: Negative for cough and shortness of breath.   Cardiovascular: Positive for chest pain.  Gastrointestinal:  Negative for abdominal pain, nausea and vomiting.  Neurological: Negative for weakness.  All other systems reviewed and are negative.    Physical Exam Updated Vital Signs BP (!) 158/90   Pulse (!) 50   Temp 98.6 F (37 C) (Oral)   Resp 14   SpO2 99%   Physical Exam  CONSTITUTIONAL: Well developed/well nourished HEAD: Normocephalic/atraumatic EYES: EOMI ENMT: Mucous membranes moist NECK: supple no meningeal signs SPINE/BACK:entire spine nontender CV: S1/S2 noted, no murmurs/rubs/gallops noted LUNGS: Lungs are clear to auscultation bilaterally, no apparent distress ABDOMEN: soft, nontender, no rebound or guarding, bowel sounds noted throughout abdomen GU:no cva tenderness NEURO: Pt is awake/alert/appropriate, moves all extremitiesx4.  No facial droop.   EXTREMITIES: pulses normal/equal, full ROM, pulses equal in both arms.  No lower extremity edema or tenderness.  Minimal bruising of right wrist, but no erythema.  No edema in either upper extremity, no signs of deformities or tenderness to left arm. Equal handgrips noted SKIN: warm, color normal PSYCH: no abnormalities of mood noted, alert and oriented to situation  ED Treatments / Results  Labs (all labs ordered are listed, but only abnormal results are displayed) Labs Reviewed  BASIC METABOLIC PANEL - Abnormal; Notable for the following components:      Result Value   CO2 21 (*)    Glucose, Bld 189 (*)    BUN 24 (*)    Creatinine, Ser 1.26 (*)    GFR calc non Af Amer 55 (*)    All other components within normal limits  CBC - Abnormal; Notable for the following components:   RBC 4.14 (*)    HCT 38.6 (*)    All other  components within normal limits  CBG MONITORING, ED - Abnormal; Notable for the following components:   Glucose-Capillary 120 (*)    All other components within normal limits  TROPONIN I    EKG EKG Interpretation  Date/Time:  Monday June 17 2018 17:15:37 EDT Ventricular Rate:  59 PR Interval:   170 QRS Duration: 80 QT Interval:  382 QTC Calculation: 378 R Axis:   14 Text Interpretation:  Sinus bradycardia Otherwise normal ECG Interpretation limited secondary to artifact Confirmed by Ripley Fraise [phone]) on 06/18/2018 1:04:40 AM   Radiology Dg Chest 2 View  Result Date: 06/17/2018 CLINICAL DATA:  Chest pain today. Hx Angiogram last week, c-spine surgery 2004. Left arm pain with slight dizziness since Thursday. EXAM: CHEST - 2 VIEW COMPARISON:  08/29/2017 FINDINGS: Linear scarring along the left hemidiaphragm. Atherosclerotic calcification of the aortic arch. Lower cervical plate and screw fixator. The lungs appear otherwise clear. Borderline enlargement of the cardiopericardial silhouette. Degenerative glenohumeral spurring. Thoracic spondylosis. No blunting of the costophrenic angles. IMPRESSION: 1. Stable scarring along the left hemidiaphragm. 2.  Aortic Atherosclerosis (ICD10-I70.0). Electronically Signed   By: Van Clines M.D.   On: 06/17/2018 18:45    Procedures Procedures   Medications Ordered in ED Medications  sodium chloride flush (NS) 0.9 % injection 3 mL (has no administration in time range)     Initial Impression / Assessment and Plan / ED Course  I have reviewed the triage vital signs and the nursing notes.  Pertinent labs & imaging results that were available during my care of the patient were reviewed by me and considered in my medical decision making (see chart for details).        Pt was concerned that the left arm pain he has had for several days represented his heart. EKG unchanged.  Troponin negative.  He is well-appearing.  Denies shortness of breath. Continues to deny any chest or back pain. He Is very well-appearing. He did have an IR procedure last week, to explore VBI.  However they approached this via his right arm.  No complications noted  He is appropriate for discharge home.  Patient agreeable with plan Final Clinical Impressions(s)  / ED Diagnoses   Final diagnoses:  Left arm pain    ED Discharge Orders    None       Ripley Fraise, MD 06/18/18 [phone]

## 2018-06-18 NOTE — Discharge Instructions (Addendum)

## 2018-06-19 DIAGNOSIS — Z23 Encounter for immunization: Secondary | ICD-10-CM | POA: Diagnosis not present

## 2018-06-19 DIAGNOSIS — M79602 Pain in left arm: Secondary | ICD-10-CM | POA: Diagnosis not present

## 2018-07-08 DIAGNOSIS — M79602 Pain in left arm: Secondary | ICD-10-CM | POA: Diagnosis not present

## 2018-07-08 DIAGNOSIS — R109 Unspecified abdominal pain: Secondary | ICD-10-CM | POA: Diagnosis not present

## 2018-07-08 DIAGNOSIS — I69354 Hemiplegia and hemiparesis following cerebral infarction affecting left non-dominant side: Secondary | ICD-10-CM | POA: Diagnosis not present

## 2018-07-08 DIAGNOSIS — E1140 Type 2 diabetes mellitus with diabetic neuropathy, unspecified: Secondary | ICD-10-CM | POA: Diagnosis not present

## 2018-07-08 DIAGNOSIS — I679 Cerebrovascular disease, unspecified: Secondary | ICD-10-CM | POA: Diagnosis not present

## 2018-07-08 DIAGNOSIS — I2510 Atherosclerotic heart disease of native coronary artery without angina pectoris: Secondary | ICD-10-CM | POA: Diagnosis not present

## 2018-07-16 ENCOUNTER — Ambulatory Visit (INDEPENDENT_AMBULATORY_CARE_PROVIDER_SITE_OTHER): Payer: Medicare Other | Admitting: Neurology

## 2018-07-16 ENCOUNTER — Other Ambulatory Visit: Payer: Self-pay

## 2018-07-16 ENCOUNTER — Encounter: Payer: Self-pay | Admitting: Neurology

## 2018-07-16 VITALS — BP 109/63 | HR 59 | Temp 98.0°F | Ht 65.0 in | Wt 198.0 lb

## 2018-07-16 DIAGNOSIS — I10 Essential (primary) hypertension: Secondary | ICD-10-CM | POA: Diagnosis not present

## 2018-07-16 DIAGNOSIS — E782 Mixed hyperlipidemia: Secondary | ICD-10-CM | POA: Diagnosis not present

## 2018-07-16 DIAGNOSIS — F0150 Vascular dementia without behavioral disturbance: Secondary | ICD-10-CM | POA: Diagnosis not present

## 2018-07-16 DIAGNOSIS — E559 Vitamin D deficiency, unspecified: Secondary | ICD-10-CM | POA: Diagnosis not present

## 2018-07-16 DIAGNOSIS — I25118 Atherosclerotic heart disease of native coronary artery with other forms of angina pectoris: Secondary | ICD-10-CM

## 2018-07-16 DIAGNOSIS — Z9989 Dependence on other enabling machines and devices: Secondary | ICD-10-CM | POA: Diagnosis not present

## 2018-07-16 DIAGNOSIS — R42 Dizziness and giddiness: Secondary | ICD-10-CM

## 2018-07-16 DIAGNOSIS — G4733 Obstructive sleep apnea (adult) (pediatric): Secondary | ICD-10-CM

## 2018-07-16 DIAGNOSIS — Z0000 Encounter for general adult medical examination without abnormal findings: Secondary | ICD-10-CM | POA: Diagnosis not present

## 2018-07-16 DIAGNOSIS — R319 Hematuria, unspecified: Secondary | ICD-10-CM | POA: Diagnosis not present

## 2018-07-16 DIAGNOSIS — D649 Anemia, unspecified: Secondary | ICD-10-CM | POA: Diagnosis not present

## 2018-07-16 DIAGNOSIS — A6000 Herpesviral infection of urogenital system, unspecified: Secondary | ICD-10-CM | POA: Diagnosis not present

## 2018-07-16 DIAGNOSIS — T66XXXD Radiation sickness, unspecified, subsequent encounter: Secondary | ICD-10-CM

## 2018-07-16 DIAGNOSIS — I679 Cerebrovascular disease, unspecified: Secondary | ICD-10-CM | POA: Diagnosis not present

## 2018-07-16 DIAGNOSIS — I69354 Hemiplegia and hemiparesis following cerebral infarction affecting left non-dominant side: Secondary | ICD-10-CM | POA: Diagnosis not present

## 2018-07-16 NOTE — Progress Notes (Signed)
PATIENT: Steve Brown DOB: 1940/04/26  REASON FOR VISIT:  Interval history 07-16-2018, [name] was last seen in a virtual visit phone call more or less by Debbora Presto nurse practitioner on 06 Jun 2018.  She followed up for OSA and CPAP only.  In the meantime on June 13, 2018 Dr. Harrington Challenger initiated a angiography study for the patient who had complained about vertigo and dizziness and has known atherosclerotic disease of the carotid arteries coronary arteries and is also a renal insufficiency patient.  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  Recurrent TIAs and syncopes were also to be evaluated as well as a history of pulmonary emboli.  The patient presented to Dr. Talbert Forest more for angiography.  The angiogram angiography was performed at: Hospital.  Patient presented with a blood pressure of 554/90 7 mmHg, respiration was 16 a minute, BMI 33.2, review of systems was normal or nonenhanced.  The result report is very long the impression is that of retrograde opacification of the nondominant left vertebrobasilar junction from the right vertebral artery to the level of the left posterior inferior cerebellar artery.  This is an indication of a proximal occlusion of the left vertebral artery.  The right middle cerebral artery on the right anterior cerebral artery showed normal drainage.  The right internal carotid artery had normal flow, the dominant right vertebral artery origin was widely patent.  Please note that the patient had a known vertebrobasilar insufficiency is a left cerebellar infarct which is fully explained by the angiographic findings.  4 days later [name] presented to the emergency room at [facility] with chest pain.  He reported left arm pain for about 2 days no true chest pain but radiating into the left arm without diaphoresis or shortness of breath.  His procedure had approached  the vascular tree through the right arm.  An EKG was normal referred by Dr. Elenore Rota recline on 06-18-2018.  Troponin was negative.  He was not short of breath.   In short we knew that [name] has posterior circulation problems, he knew that he had a cerebellar stroke in the past and a vertebral artery occlusion.  This explains most of his dizziness especially when visible to general the diagnosis of cerebrovascular disease has been made in the year 2010 or 2011 for the first time.  Meclizine is asymptomatic treatment can help to suppress some of the vertigo symptoms or dizziness symptoms it is never a cure.  He also has had physical therapy with Dr. Festus Holts ENT and has not felt that this has benefited his balance.  I also was able to dedicate a little bit of time to his compliance on CPAP, he has been using the machine 24 out of 30 days with 67% compliance until 08 July 2018 I wish he would have a more actual date which could look more compliant, CPAP is set at 13 cm pressure with 3 cm EPR and he has an AHI residual of 1.0/h which speaks for very good resolution of apnea he does have very high air leakage.  I would like for him to consider continuing to use the machine but for over 4 hours each night he has made significant effort since I last talked to him, his Epworth Sleepiness Scale is six-point out of 24 which also is reduced,  The machine is 76 years old and  we will retest him and prescribe an auto-titration device.    Interval history from 31 July 2017 for [name], a 78 year old African-American [name], a 78 year old African-American right-handed gentleman who is presenting with cognitive concerns concerns of vascular origin.  He has been diagnosed with a history of multi-infarct cerebrovascular disease, hypertension, diabetes, has been followed for CPAP compliance, was on chronic anticoagulation but no longer is able to take aspirin. He reports dizziness - lightheaded, not exercise tolerant. Worse when standing up.  Antivert was given by PCP.  This has to be seen in relation to changes in RBC and WBC - Dr Waymon Budge follows also his anticoagulants.    With CPAP he had for a while drastically improved his apnea count but he is not using the CPAP on a nightly basis.  He has now again nocturia and sleepiness, higher fatigue- see below.  CPAP Compliance for the last 30 days was only 30% and average of 1 hours 38 minutes, CPAP was set at 13 cm with 3 cm EPR he has a high air leak but the residual apnea count is only 2.2/h.  I am not sure how I can encourage him to use the machine more often.    In light of the his CPAP noncompliance is also the results of recent MRIs of the brain on 23 July 2017.  These were ordered by Dr. Harrington Challenger, his primary care physician.  MRI brain with and without contrast showed a 4 mm right periventricular occipital focus of reduced diffusion likely a new stroke, older strokes affect the right cerebellar region, the left frontal lobe, patchy pontine and supratentorial white matter intensities.  There is also chronically occluded left vertebral artery.  Known is also that the patient has myelomalacia and his cervical spine MRI was done without contrast = It showed between C3 and the 6 cervical vertebrae and anterior cervical fusion with arthrodesis, he does not have canal stenosis but neuroforaminal narrowing on multiple levels.  This may also explain some arm pain or shoulder pain or even weakness.   His Mini-Mental status examination today was 26 out of 30 points, the Epworth sleepiness score 11 points, fatigue severity was high at 47 out of 63 possible points.  The patient also presented with a very low white blood cell count to his primary care physician WBC was only 3.8, RBC 4.0 hemoglobin 12.9 hematocrit 38 MCV 95 which is high, neutrophils 1.6K per microliter which is low.- has a diagnosis of non-diabetic retinopathy was a working diagnosis.  He is followed by Dr. Geroge Baseman, a podiatrist. He is  not driving now.   Interval history from 09/28/2016. Steve Brown had recently seen his primary care physician, Dr. Harle Battiest, who ordered an MRI of the brain, this time the MRI was performed at a different location from his previous study from 10/ 2016 ( have been performed at [facility] and was interpreted by Dr. Lanora Manis). The study was interpreted by Dr. Marinus Maw.  Reading the reports it seems very similar. The new about a bilateral cerebellar hemispheres, left paramedian pons, the new about chronic sinusitis and diffuse white matter disease. Dr. Marinus Maw commented on a small area of encephalomalacia that he found slightly atypical in the left frontal lobe.  I was able to review both MRI side-by-side in the presence of the patient. I do not see a difference. I reviewed T2-weighted images and axial FLAIR.  I have the nature of seeing Steve Brown today on 08/03/2016, he has achieved 80% compliance on his CPAP  use was 5 hours and 4 minutes of daily use on average, his residual AHI is 1.3. He is using CPAP at a set pressure of 13 cm water with 3 cm EPR. He had become non compliant - and needed a new study for  Supplies and resetting of his CPAP>  He had undergone a new split-night titration study on 03/27/2016 also he had been followed in our sleep clinic since 2013. He has a history of multi-infarct cerebrovascular disease, hypertension, diabetes, and has been followed for CPAP compliance.  His baseline AHI was confirmed at 60.1 per hour AHI, he had prolonged hypoxemia saturation below 89% was 79 minutes.  He improved drastically with CPAP saturation equaled only 8 minutes, he had no PLMS, his AHI was reduced to 4.5. Sleep efficiency with CPAP was 98%! He still has nocturia 3-4 times, and he sleeps longer than he uses CPAP. Feels less fatigued.     HPI:  This patient has been established as a sleep patient in our clinic since 2013, has a history of multi-infarct, hypertension, diabetes and was evaluated for  neuropathy in 2015, was followed for CPAP compliance until last May when he was no longer compliant due to repeated sinus infections, hospitalizations etc. He stated today that he has not been able to reinitiate CPAP use since March 2017.  His primary care physician, Dr. Harrington Challenger, re- referred now for memory concerns, in a patient with known multi-infarct history but with a sliding result on his minimal mental status examinations. A note from January 5 of this year states that the patient had scored 26 out of 29 points in a high school educated African-American gentleman age 75. The patient had suffered multiple strokes and ongoing presumed TIA events, image studies were quoted below. II suspected that this is a vascular or infarct related memory loss rather than Alzheimer's disease.  It was after review of his notes from Dr Caroline Sauger at [facility] that I noticed that he had just been reevaluated for new stroke at the end of last year including imaging studies. I do not need to repeat any. His cognitive deficits are  explained with a multi-infarct dementia.   UPDATE 05/18/14 Steve Brown, who was diagnosed with severe sleep apnea at an AHI of 65.9, has made efforts to increase his CPAP compliance. CPAP was initiated to reduce his secondary CVA risk.   He stated that he had some sinusitis problems that kept him from using CPAP and has just seen Dr. Ernesto Rutherford who prescribed no antibiotics after other measures had failed. He has COPD / Asthma and sees pulmonology. He is reluctant to use an anti-decongestant which I would also not recommend because it can spike his blood pressure. He has Flonase which Dr. Harrington Challenger his primary care physician has prescribed for him.  He is now 185 pounds. He also reports that he has a hard time sleeping on his back. Therefore when he sleeps on his sides occasionally the pillow will push the mask off his face causing a leak. He reports that he does try to exercise daily. He has continued on  Xarelto and aspirin for stroke prevention. His primary care is managing his hypertension and hyperlipidemia. He denies any additional strokelike symptoms.    HISTORY HISTORY 08/14/13 (CD): Steve Brown is meanwhile 78 years old and has been staying active. He has controlled his weight he is physically active, he also has undergone a complete course of physical therapy since last visit. After out last visit, we had  obtained an MRI of the brain without contrast and compared to the study from earlier the same year : there was no change in comparison to the May CT on this MRI dated 07-04-13 . The patient has few scattered small vessel injuries and some lacunar infarcts. The main risk factor for these as hypertension and diabetes- Occupational therapy saw him for left arm pain. Additional risk factor is the patient's diabetes mellitus condition. I referred him for occupational therapy to get further insight in what may have caused his arm pain.  He noted that he could left pounds with the left 3 pounds with her right hand now there is still a significant difference .  resulting in the conduction study and EMG study which was then performed on 07-09-13, normal study no evidence of large fiber neuropathy in the left upper extremity deltoid, biceps, triceps and flexor carpi radialis as well as interosseus Muscles; no abnormal EMG activity.  He underwent a sleep study at [place] [facility] : results from 08-07-13 reviewed today :  Steve Brown was diagnosed with severe sleep apnea at an AHI of 65.9. Lowest point of oxygen saturation was 70%. He was titrated to CPAP at 9 cm water he slept 92.7 minutes at that pressure of which 23 minutes of REM sleep. The AHI was now 0.6 per hour. The previously very fragmented sleep architecture became now essentially normal. The technologist used a PIC all equal nasal mask. The patient is asked to return in about 50 days for a followup with the CPAP machine.  I also reviewed his long  medication list : patient has been on Xeralto for chronic anticoagulation.  REVIEW OF SYSTEMS: Out of a complete 14 system review of symptoms, the patient complains only of the following symptoms, and all other reviewed systems are negative.  "i cannot recall the Sunday sermon. Non compliant with CPAP- 11 month on and off.   ALLERGIES: Allergies  Allergen Reactions   Other Other (See Comments)    Per patient- cardiac cath dye-  "woke up during procedure hysterical."   Phenergan [Promethazine] Other (See Comments)    Mood changes     HOME MEDICATIONS: Outpatient Medications Prior to Visit  Medication Sig Dispense Refill   acetaminophen (TYLENOL) 500 MG tablet Take 1,000 mg by mouth every 6 (six) hours as needed (pain).      albuterol (PROVENTIL HFA;VENTOLIN HFA) 108 (90 Base) MCG/ACT inhaler Inhale 2 puffs into the lungs every 6 (six) hours as needed for wheezing or shortness of breath.     Ascorbic Acid (VITAMIN C PO) Take 1 tablet by mouth daily.     bisacodyl (DULCOLAX) 5 MG EC tablet Take 5 mg by mouth daily as needed for moderate constipation.     cholecalciferol (VITAMIN D) 1000 UNITS tablet Take 1,000 Units by mouth daily.     diclofenac sodium (VOLTAREN) 1 % GEL Apply 1 application topically See admin instructions. Apply topically once daily , may use an additional time as needed for pain  1   Echinacea 450 MG CAPS Take 450 mg by mouth daily.      fluticasone (FLONASE) 50 MCG/ACT nasal spray Place 1 spray into both nostrils 2 (two) times daily as needed for allergies.   0   furosemide (LASIX) 20 MG tablet Take 20 mg by mouth daily as needed (leg and ankle swelling).      insulin lispro (HUMALOG KWIKPEN) 100 UNIT/ML KiwkPen Inject 6-10 Units into the skin 3 (three) times daily with meals.  IRON PO Take 1 tablet by mouth daily.     losartan (COZAAR) 100 MG tablet Take 100 mg by mouth daily.     meclizine (ANTIVERT) 25 MG tablet Take 12.5-25 mg by mouth 3 (three)  times daily as needed for dizziness.     metFORMIN (GLUCOPHAGE) 500 MG tablet Take 500 mg by mouth 2 (two) times daily as needed (CBG >200).      Multiple Vitamin (MULTIVITAMIN WITH MINERALS) TABS tablet Take 1 tablet by mouth daily.     nitroGLYCERIN (NITROSTAT) 0.4 MG SL tablet Place 1 tablet (0.4 mg total) under the tongue every 5 (five) minutes as needed for chest pain. 25 tablet 12   pantoprazole (PROTONIX) 40 MG tablet Take 40 mg by mouth daily as needed (acid reflux/heartburn).      pregabalin (LYRICA) 300 MG capsule Take 1 capsule (300 mg total) by mouth 2 (two) times daily. 180 capsule 3   rivaroxaban (XARELTO) 20 MG TABS tablet Take 20 mg by mouth daily.      rosuvastatin (CRESTOR) 10 MG tablet Take 10 mg by mouth at bedtime.      tamsulosin (FLOMAX) 0.4 MG CAPS capsule Take 0.4 mg by mouth daily.     TRESIBA FLEXTOUCH 100 UNIT/ML SOPN FlexTouch Pen Inject 20 Units into the skin every morning.     valACYclovir (VALTREX) 500 MG tablet Take 500 mg by mouth daily.     No facility-administered medications prior to visit.     PAST MEDICAL HISTORY: Past Medical History:  Diagnosis Date   Abnormal prostate biopsy    Anticoagulant long-term use    currently xarelto   BPH with elevated PSA    CKD (chronic kidney disease), stage II    Complication of anesthesia    limted neck rom limited use of left arm due to cva   Coronary artery disease    CARDIOLOGIST-  DR Irish Lack--  2010-- PCI w/ stenting midLAD   DDD (degenerative disc disease), lumbar    Degeneration of cervical intervertebral disc    Depression    Dyspnea on exertion    GERD (gastroesophageal reflux disease)    Hemiparesis due to cerebral infarction    History of cerebrovascular accident (CVA) with residual deficit 2002 and 2003--  hemiparisis both sides   per MRI  anterior left frontal lobe, left para midline pons, and inferior cerebullam bilaterally infarcts   History of pulmonary embolus (PE)     06-30-2012  extensive bilaterally   History of recurrent TIAs    History of syncope    hx multiple pre-syncope and syncopal episodes due to vasovagal, orthostatic hypotension, dehydration   History of TIAs    several since 2002   Hyperlipidemia    Hypertension    Mild atherosclerosis of carotid artery, bilateral    per last duplex 11-04-2014  bilateral ICA 1--39%   Neuropathy    fingers   OSA on CPAP    followed by dr Delbert Darley--  sev. osa w/ AHI 65.9   Prostate cancer ([place]) dx 2018   Renal insufficiency    S/P coronary artery stent placement 2010   stenting to mid LAD   Simple renal cyst    bilaterally   Stroke [facility])    Trigger finger of both hands 11-17-13   Type 2 diabetes mellitus ([place]) dx 1986   last one A1c 9.2 on 04-26-2016   Unsteady gait    . Hx prior CVA/TIAs;   Vertebral artery occlusion, left    chronic  PAST SURGICAL HISTORY: Past Surgical History:  Procedure Laterality Date   ANTERIOR CERVICAL DECOMP/DISCECTOMY FUSION  2004   C3 -- C6 limited rom   CARDIAC CATHETERIZATION  06-10-2010   dr Irish Lack   wide patent LAD stent, mid lesion at the origin of the septal prior to the previous stent 40-50%/  normal LVF, ef 55%   CARDIOVASCULAR STRESS TEST  10-23-2012  dr Irish Lack   normal nuclear perfusion study w/ no ischemia/  normal LV function and wall motion , ef 65%   CARPAL TUNNEL RELEASE Bilateral    CATARACT EXTRACTION W/ INTRAOCULAR LENS  IMPLANT, BILATERAL     CHOLECYSTECTOMY N/A 11/02/2015   Procedure: LAPAROSCOPIC CHOLECYSTECTOMY WITH INTRAOPERATIVE CHOLANGIOGRAM;  Surgeon: Donnie Mesa, MD;  Location: [place];  Service: General;  Laterality: N/A;   COLONOSCOPY     CORONARY ANGIOPLASTY WITH STENT PLACEMENT  02/2008   stenting to mid LAD   GOLD SEED IMPLANT N/A 11/15/2016   Procedure: [place];  Surgeon: Ardis Hughs, MD;  Location: [facility];  Service: Urology;  Laterality: N/A;   IR  ANGIO INTRA EXTRACRAN SEL COM CAROTID INNOMINATE BILAT MOD SED  06/13/2018   IR ANGIO VERTEBRAL SEL VERTEBRAL UNI R MOD SED  06/13/2018   IR US GUIDE VASC ACCESS RIGHT  06/13/2018   LEFT HEART CATH AND CORONARY ANGIOGRAPHY N/A 05/25/2017   Procedure: LEFT HEART CATH AND CORONARY ANGIOGRAPHY;  Surgeon: Jettie Booze, MD;  Location: [place] [facility];  Service: Cardiovascular;  Laterality: N/A;   LEFT HEART CATHETERIZATION WITH CORONARY ANGIOGRAM N/A 04/03/2013   Procedure: LEFT HEART CATHETERIZATION WITH CORONARY ANGIOGRAM;  Surgeon: Jettie Booze, MD;  Location: [facility] CATH LAB;  Service: Cardiovascular;  Laterality: N/A;  patent mLAD stent  w/ mild disease in remainder LAD and its branches;  mod. focal lesion midLCFx- FFR of lesion was negative for ischemia/  normal LVSF, ef 50%   lungs  2005   "fluid pumped off lungs"   NEUROPLASTY / TRANSPOSITION ULNAR NERVE AT ELBOW Right 2004   PROSTATE BIOPSY N/A 08/31/2016   Procedure: PROSTATE  BIOPSY TRANSRECTAL ULTRASONIC PROSTATE (TUBP);  Surgeon: Ardis Hughs, MD;  Location: [facility];  Service: Urology;  Laterality: N/A;   SPACE OAR INSTILLATION N/A 11/15/2016   Procedure: SPACE OAR INSTILLATION;  Surgeon: Ardis Hughs, MD;  Location: [facility];  Service: Urology;  Laterality: N/A;   TRANSTHORACIC ECHOCARDIOGRAM  04/27/2016   severe focal basal LVH, ef 60-65%,  grade 2 diastoilc dysfunction/  mild AR, MR, and TR/  atrial septum lipomatous hypertrophy/  PASP [id number]   UMBILICAL HERNIA REPAIR      FAMILY HISTORY: Family History  Problem Relation Age of Onset   Aneurysm Mother    Cancer Father        unknown either pancreatic or prostate   Heart attack Neg Hx     SOCIAL HISTORY: Social History   Socioeconomic History   Marital status: Divorced    Spouse name: Not on file   Number of children: 4   Years of education: 12   Highest education level: Not on file  Occupational  History    Employer: RETIRED    Comment: retired  Scientist, product/process development strain: Not on file   Food insecurity    Worry: Not on file    Inability: Not on file   Transportation needs    Medical: Not on file  Non-medical: Not on file  Tobacco Use   Smoking status: Never Smoker   Smokeless tobacco: Never Used  Substance and Sexual Activity   Alcohol use: No    Alcohol/week: 0.0 standard drinks   Drug use: No   Sexual activity: Yes  Lifestyle   Physical activity    Days per week: Not on file    Minutes per session: Not on file   Stress: Not on file  Relationships   Social connections    Talks on phone: Not on file    Gets together: Not on file    Attends religious service: Not on file    Active member of club or organization: Not on file    Attends meetings of clubs or organizations: Not on file    Relationship status: Not on file   Intimate partner violence    Fear of current or ex partner: Not on file    Emotionally abused: Not on file    Physically abused: Not on file    Forced sexual activity: Not on file  Other Topics Concern   Not on file  Social History Narrative   Patient lives at home alone and he is single.  Patient is retired.    Caffeine - one cups daily.   Right handed.   Patient has a high school education.   Patient has four adult children.      PHYSICAL EXAM  Vitals:   07/16/18 1359  BP: 109/63  Pulse: (!) 59  Temp: 98 F (36.7 C)  Weight: 198 lb (89.8 kg)  Height: 5\' 5"  (1.651 m)   Body mass index is 32.95 kg/m.  Generalized: Well developed, in no acute distress . Seems depressed.   Pale mucous lining.   Neurological examination  Mentation: Alert oriented to time, place, history taking.   MMSE - Mini Mental State Exam 07/31/2017  Orientation to time 5  Orientation to Place 5  Registration 3  Attention/ Calculation 3  Recall 2  Language- name 2 objects 2  Language- repeat 1  Language- follow 3 step  command 3  Language- read & follow direction 1  Write a sentence 1  Copy design 0  Total score 26     Montreal Cognitive Assessment  02/21/2016  Visuospatial/ Executive (0/5) 3  Naming (0/3) 3  Attention: Read list of digits (0/2) 2  Attention: Read list of letters (0/1) 0  Attention: Serial 7 subtraction starting at 100 (0/3) 0  Language: Repeat phrase (0/2) 2  Language : Fluency (0/1) 1  Abstraction (0/2) 2  Delayed Recall (0/5) 5  Orientation (0/6) 5  Total 23  Adjusted Score (based on education) 24    DIAGNOSTIC DATA (LABS, IMAGING, TESTING) - I reviewed patient records, labs, notes, testing and imaging myself where available.   Lab Results  Component Value Date   WBC 4.6 06/17/2018   HGB 13.2 06/17/2018   HCT 38.6 (L) 06/17/2018   MCV 93.2 06/17/2018   PLT 178 06/17/2018     General: The patient is awake, alert and appears in no acute distress. The patient is well groomed. HEENT: Normocephalic, atraumatic.  Face is symmetric without facial masking.  Pupils are equal, round & reactive to light & accomodation.  Extraocular tracking  no nystagmus noted.    Mallampati is class  3.  Tongue protrudes centrally & palate elevates symmetrically.  Neck:  Supple.ring grossly intact.  Speech is clear , mildly hypophonic with no dysarthria noted.   There is no  lip, neck or jaw tremor.  Neck is mildly rigid.  Oropharynx exam reveals  Mental Status:  The patient is awake, alert, oriented to all 4 spheres.   His memory, language & knowledge are appropriate.  Thre is no aphasia, agnosia, apraxia or anomia.  Speech is clear with normal prosody & enunciation.   Cranial nerves: Described above under HEENT exam.  In addition, shoulder shrug is normal with equal shoulder height noted. Sensory:  Fine touch, pinprick, vibration & temperature sensation were well-preserved.  Motor exam:   Normal tone and normal muscle bulk and symmetric normal strength in all extremities.  No resting tremor,  no postural or action tremor.   Cerebellar testing:  No dysmetria or intention tremor on finger-to-nose testing.   Arms were extended fully, no pronator drift. There is no truncal or gait ataxia.    ASSESSMENT AND PLAN 78 y.o. year old male who has been followed for CPAP compliance in the sleep clinic of the [place] office,  has now been re- referred by Dr Harrington Challenger, MD,  for posterior perfusion, brain -cerebellum vasculation evaluation.  His remote stroke left cerebellar was confirmed as originating near the PICA through vascular occlusion of the vertebral artery. Angiography confirms this indirect as cause of the balance problems.  Multi infarct dementia- stroke prevention is main goal, I will ask him to keep his follow ups with Dr. Caroline Sauger at [facility] neuro[facility]vascular [facility].  He is due for a new CPAP, I ordered a confirmatory sleep test  ( HST will suffice )and we. Will prescribe auto-CPAP.   He will from now on see the  Nurse Practitoner  , next visit in 3 month, alternating with me .   Larey Seat, MD  07/16/2018, 2:34 PM [facility] [address], [place], [place] [address] [phone]

## 2018-07-16 NOTE — Patient Instructions (Signed)

## 2018-07-16 NOTE — Addendum Note (Signed)
Addended by: Larey Seat on: 07/16/2018 03:00 PM   Modules accepted: Orders

## 2018-07-19 DIAGNOSIS — R300 Dysuria: Secondary | ICD-10-CM | POA: Diagnosis not present

## 2018-07-19 DIAGNOSIS — Z8546 Personal history of malignant neoplasm of prostate: Secondary | ICD-10-CM | POA: Diagnosis not present

## 2018-07-19 DIAGNOSIS — R310 Gross hematuria: Secondary | ICD-10-CM | POA: Diagnosis not present

## 2018-08-29 ENCOUNTER — Ambulatory Visit (INDEPENDENT_AMBULATORY_CARE_PROVIDER_SITE_OTHER): Payer: Medicare Other | Admitting: Neurology

## 2018-08-29 ENCOUNTER — Other Ambulatory Visit: Payer: Self-pay

## 2018-08-29 DIAGNOSIS — G4733 Obstructive sleep apnea (adult) (pediatric): Secondary | ICD-10-CM

## 2018-08-29 DIAGNOSIS — G4734 Idiopathic sleep related nonobstructive alveolar hypoventilation: Secondary | ICD-10-CM

## 2018-08-29 DIAGNOSIS — T66XXXD Radiation sickness, unspecified, subsequent encounter: Secondary | ICD-10-CM

## 2018-08-29 DIAGNOSIS — F0150 Vascular dementia without behavioral disturbance: Secondary | ICD-10-CM

## 2018-08-29 DIAGNOSIS — I679 Cerebrovascular disease, unspecified: Secondary | ICD-10-CM

## 2018-08-29 DIAGNOSIS — R42 Dizziness and giddiness: Secondary | ICD-10-CM

## 2018-09-02 DIAGNOSIS — R3915 Urgency of urination: Secondary | ICD-10-CM | POA: Diagnosis not present

## 2018-09-02 DIAGNOSIS — F430 Acute stress reaction: Secondary | ICD-10-CM | POA: Diagnosis not present

## 2018-09-02 DIAGNOSIS — R6889 Other general symptoms and signs: Secondary | ICD-10-CM | POA: Diagnosis not present

## 2018-09-04 DIAGNOSIS — M5417 Radiculopathy, lumbosacral region: Secondary | ICD-10-CM | POA: Diagnosis not present

## 2018-09-04 DIAGNOSIS — M5136 Other intervertebral disc degeneration, lumbar region: Secondary | ICD-10-CM | POA: Diagnosis not present

## 2018-09-08 NOTE — Addendum Note (Signed)
Addended by: Larey Seat on: 09/08/2018 01:25 PM   Modules accepted: Orders

## 2018-09-08 NOTE — Procedures (Signed)
PATIENT'S NAME:  Steve Brown, Steve Brown DOB:      June 23, 1940      MRN:    [id number]     DATE OF RECORDING: 08/29/2018  MR REFERRING M.D.:  Lona Kettle, MD  Study Performed:   Baseline Polysomnogram HISTORY:  Mr. Gillis Holthusen, a 78 year old African American gentleman, was seen for CPAP compliance in a virtual visit with NP Lomax on 06-06-2018. On 06-13-2018 he had acute vertigo and was referred for angiography. He has carotid disease, renal artery disease, and strokes / TIAs in the past. 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 This explains most of his dizziness, especially since the diagnosis of cerebrovascular disease has been made in the year 2010 or 2011 for the first time.  Meclizine can help to suppress some of the vertigo symptoms or dizziness symptoms, but did not offer a cure.  He also has had therapy Bettey Costa  withDr. Benjamine Mola in ENT and has not felt that this has benefited his balance. He was seen 4 days after angiography in the ED with angina.     I also was able to dedicate a little bit of time to his compliance on CPAP, he has been using the machine 24 out of 30 days with 67% compliance until 08 July 2018. I wish he would have a more actual date. His CPAP is set at 13 cm pressure with 3 cm EPR and he has an AHI residual of 1.0/h- which speaks for very good resolution of apnea. He does have very high air leakage.  I would like for him to consider continuing to use the machine but for well over 4 hours each night. Epworth Sleepiness Scale is six-point out of 24 which also is reduced since on CPAP.  Plan: The machine is 76 years old and we will retest him and prescribe an auto-titration device. The patient endorsed the Epworth Sleepiness Scale at 6 points.  The patient's weight 198 pounds with a height of 65 (inches), resulting in a BMI of 33.1 kg/m2.   CURRENT MEDICATIONS:  Augmentin, Vitamin C, Aspirin, BD Pen, Peridex, Vitamin D, Voltaren, Avodart, FE, Flonase, Lasix, Humalog, Lactinex, Glucophage, Multivitamin, Mycostatin, Protonix, Lyrica, Altace, Xarelto, Crestor, Valtrex, Oxycodone.   PROCEDURE:  This is a multichannel digital polysomnogram utilizing the Somnostar 11.2 system.  Electrodes and sensors were applied and monitored per AASM Specifications.   EEG, EOG, Chin and Limb EMG, were sampled at 200 Hz.  ECG, Snore and Nasal Pressure, Thermal Airflow, Respiratory Effort, CPAP Flow and Pressure, Oximetry was sampled at 50 Hz. Digital video and audio were recorded.      BASELINE STUDY: Lights Out was at 22:01 and Lights On at 04:51.  Total recording time (TRT) was 411 minutes, with a total sleep time (TST) of 347 minutes.   The patient's sleep latency was 10.5 minutes.  REM latency was 69 minutes.  The sleep efficiency was 84.4 %.     SLEEP ARCHITECTURE: WASO (Wake after sleep onset) was 60.5 minutes.  There were 27.5 minutes in Stage N1, 280 minutes Stage N2, 0 minutes Stage N3 and 39.5 minutes in Stage REM.  The percentage of Stage N1 was 7.9%, Stage N2 was 80.7%, Stage N3 was 0% and Stage R (REM sleep) was 11.4%.   RESPIRATORY ANALYSIS:  There were a total of 278 respiratory events:  166 obstructive apneas, 38 central apneas and  20 mixed apneas with a total of 224 apneas and an apnea index (AI) of 38.7 /hour. There were 54 hypopneas with a hypopnea index of 9.3 /hour. The patient also had several respiratory event related arousals (RERAs).      The total APNEA/HYPOPNEA INDEX (AHI) was 48.1 /hour.  26 events occurred in REM sleep and 150 events in NREM. The REM AHI was 39.5 /hour, versus a non-REM AHI of 49.2. The patient spent 19 minutes of total sleep time in the supine position and 328 minutes in non-supine. The supine AHI was 56.9 versus a non-supine AHI of 47.6/h.  OXYGEN SATURATION & C02:  The Wake baseline 02 saturation was 93%, with the lowest being 73%.  Time spent below 89% saturation equaled 209 minutes.   AROUSALS:  The arousals were noted as: 26 were spontaneous, 0 were associated with PLMs, and 142 were associated with respiratory events. The patient had a total of 0 Periodic Limb Movements.   Audio and video analysis did not show any abnormal or unusual movements, complex behaviors, phonations or vocalizations.   Snoring was loud and intermittent, very loud while sleeping on his left side.  IMPRESSION: 1. Complex, mostly Obstructive Sleep Apnea (OSA) a6t AHI of 48.1/ h - high severity.  2. Primary Snoring 3. Some Central and Mixed Sleep Apnea was present.   4. Prolonged Hypoxemia, most severe desaturation during REM sleep ( see screen shots)    RECOMMENDATIONS:  1. Advise full-night, attended, CPAP titration study to optimize therapy. He has done well on 13 cm water pressure, but may have remained hypoxic under CPAP therapy. I will request oxygen supplementation in that case.    I certify that I have reviewed the entire raw data recording prior to the issuance of this report in accordance with the Standards of Accreditation of the American Academy of Sleep Medicine (AASM)   Larey Seat, MD   09-07-2018 Diplomat, American Board of Psychiatry and Neurology  Diplomat, American Board of Sleep Medicine Medical Director, [place] [facility] [place], NP, Lawerance Cruel, MD

## 2018-09-10 ENCOUNTER — Telehealth: Payer: Self-pay

## 2018-09-10 NOTE — Telephone Encounter (Signed)
-----   Message from Larey Seat, MD sent at 09/08/2018  1:25 PM EDT ----- IMPRESSION:  1. Complex, mostly Obstructive Sleep Apnea (OSA) a6t AHI of 48.1/  h - high severity.  2. Primary Snoring  3. Some Central and Mixed Sleep Apnea was present.   4. Prolonged Hypoxemia, most severe desaturation during REM sleep  ( see screen shots)    RECOMMENDATIONS:   1. Advise full-night, attended, CPAP titration study to optimize  therapy. He has done well on 13 cm water pressure, but may have  remained hypoxic under CPAP therapy. I will request oxygen  supplementation in that case.    Cc to Dr. Harrington Challenger, Sadie Haber

## 2018-09-10 NOTE — Telephone Encounter (Signed)
I called pt. I advised pt that Dr. Brett Fairy reviewed their sleep study results and found that has severe osa and recommends that pt be treated with a cpap. Dr. Brett Fairy recommends that pt return for a repeat sleep study in order to properly titrate the cpap and ensure a good mask fit. Pt is agreeable to returning for a titration study. I advised pt that our sleep lab will file with pt's insurance and call pt to schedule the sleep study when we hear back from the pt's insurance regarding coverage of this sleep study. Pt verbalized understanding of results. Pt had no questions at this time but was encouraged to call back if questions arise.

## 2018-09-17 DIAGNOSIS — H52203 Unspecified astigmatism, bilateral: Secondary | ICD-10-CM | POA: Diagnosis not present

## 2018-09-17 DIAGNOSIS — E119 Type 2 diabetes mellitus without complications: Secondary | ICD-10-CM | POA: Diagnosis not present

## 2018-09-25 ENCOUNTER — Other Ambulatory Visit: Payer: Self-pay

## 2018-09-25 ENCOUNTER — Encounter (HOSPITAL_BASED_OUTPATIENT_CLINIC_OR_DEPARTMENT_OTHER): Payer: Medicare Other | Attending: Physician Assistant

## 2018-09-25 DIAGNOSIS — Z86718 Personal history of other venous thrombosis and embolism: Secondary | ICD-10-CM | POA: Diagnosis not present

## 2018-09-25 DIAGNOSIS — Z872 Personal history of diseases of the skin and subcutaneous tissue: Secondary | ICD-10-CM | POA: Insufficient documentation

## 2018-09-25 DIAGNOSIS — Z923 Personal history of irradiation: Secondary | ICD-10-CM | POA: Diagnosis not present

## 2018-09-25 DIAGNOSIS — I10 Essential (primary) hypertension: Secondary | ICD-10-CM | POA: Insufficient documentation

## 2018-09-25 DIAGNOSIS — E119 Type 2 diabetes mellitus without complications: Secondary | ICD-10-CM | POA: Diagnosis not present

## 2018-09-25 DIAGNOSIS — G4730 Sleep apnea, unspecified: Secondary | ICD-10-CM | POA: Diagnosis not present

## 2018-09-25 DIAGNOSIS — Z955 Presence of coronary angioplasty implant and graft: Secondary | ICD-10-CM | POA: Insufficient documentation

## 2018-09-25 DIAGNOSIS — E1140 Type 2 diabetes mellitus with diabetic neuropathy, unspecified: Secondary | ICD-10-CM | POA: Insufficient documentation

## 2018-09-25 DIAGNOSIS — Z86711 Personal history of pulmonary embolism: Secondary | ICD-10-CM | POA: Diagnosis not present

## 2018-09-25 DIAGNOSIS — Z8673 Personal history of transient ischemic attack (TIA), and cerebral infarction without residual deficits: Secondary | ICD-10-CM | POA: Diagnosis not present

## 2018-09-25 DIAGNOSIS — I2510 Atherosclerotic heart disease of native coronary artery without angina pectoris: Secondary | ICD-10-CM | POA: Diagnosis not present

## 2018-09-25 DIAGNOSIS — Z794 Long term (current) use of insulin: Secondary | ICD-10-CM | POA: Diagnosis not present

## 2018-09-25 DIAGNOSIS — S81801A Unspecified open wound, right lower leg, initial encounter: Secondary | ICD-10-CM | POA: Diagnosis not present

## 2018-10-02 DIAGNOSIS — M5136 Other intervertebral disc degeneration, lumbar region: Secondary | ICD-10-CM | POA: Diagnosis not present

## 2018-10-02 DIAGNOSIS — M5417 Radiculopathy, lumbosacral region: Secondary | ICD-10-CM | POA: Diagnosis not present

## 2018-10-05 ENCOUNTER — Other Ambulatory Visit (HOSPITAL_COMMUNITY)
Admission: RE | Admit: 2018-10-05 | Discharge: 2018-10-05 | Disposition: A | Discharge status: Home / Self Care | Payer: Medicare Other | Source: Ambulatory Visit | Attending: Neurology | Admitting: Neurology

## 2018-10-05 DIAGNOSIS — Z20828 Contact with and (suspected) exposure to other viral communicable diseases: Secondary | ICD-10-CM | POA: Diagnosis not present

## 2018-10-05 DIAGNOSIS — Z01812 Encounter for preprocedural laboratory examination: Secondary | ICD-10-CM | POA: Insufficient documentation

## 2018-10-06 LAB — NOVEL CORONAVIRUS, NAA (HOSP ORDER, SEND-OUT TO REF LAB; TAT 18-24 HRS): SARS-CoV-2, NAA: NOT DETECTED

## 2018-10-07 ENCOUNTER — Other Ambulatory Visit (HOSPITAL_COMMUNITY): Payer: Self-pay

## 2018-10-09 ENCOUNTER — Other Ambulatory Visit: Payer: Self-pay

## 2018-10-09 ENCOUNTER — Ambulatory Visit (INDEPENDENT_AMBULATORY_CARE_PROVIDER_SITE_OTHER): Payer: Medicare Other | Admitting: Neurology

## 2018-10-09 DIAGNOSIS — G4733 Obstructive sleep apnea (adult) (pediatric): Secondary | ICD-10-CM | POA: Diagnosis not present

## 2018-10-09 DIAGNOSIS — T66XXXD Radiation sickness, unspecified, subsequent encounter: Secondary | ICD-10-CM

## 2018-10-09 DIAGNOSIS — R42 Dizziness and giddiness: Secondary | ICD-10-CM

## 2018-10-09 DIAGNOSIS — Y842 Radiological procedure and radiotherapy as the cause of abnormal reaction of the patient, or of later complication, without mention of misadventure at the time of the procedure: Secondary | ICD-10-CM

## 2018-10-09 DIAGNOSIS — I679 Cerebrovascular disease, unspecified: Secondary | ICD-10-CM

## 2018-10-09 DIAGNOSIS — F0150 Vascular dementia without behavioral disturbance: Secondary | ICD-10-CM

## 2018-10-09 DIAGNOSIS — Z9989 Dependence on other enabling machines and devices: Secondary | ICD-10-CM

## 2018-10-09 DIAGNOSIS — G4734 Idiopathic sleep related nonobstructive alveolar hypoventilation: Secondary | ICD-10-CM

## 2018-10-21 DIAGNOSIS — G4734 Idiopathic sleep related nonobstructive alveolar hypoventilation: Secondary | ICD-10-CM | POA: Insufficient documentation

## 2018-10-21 DIAGNOSIS — F0150 Vascular dementia without behavioral disturbance: Secondary | ICD-10-CM | POA: Insufficient documentation

## 2018-10-21 NOTE — Procedures (Signed)
PATIENT'S NAME:  Fard, Jacek DOB:      April 25, 1940      MR#:    [id number]     DATE OF RECORDING: 10/09/2018 Reynold Bowen.  REFERRING M.D.:  Lona Kettle, MD Study Performed:   Titration to positive airway pressure HISTORY:  The current CPAP machine is 76 years old and will be replaced by an auto-titration device.  Last sleep study from 08-29-2018: The total APNEA/HYPOPNEA INDEX (AHI) was 48.1 /hour.   The REM AHI was 39.5 /hour, versus a non-REM AHI of 49.2. The supine AHI was 56.9 versus a non-supine AHI of 47.6/h. Sp02 saturation nadir 73%. Time spent below 89% saturation equaled 209 minutes. The patient endorsed the Epworth Sleepiness Scale at 6 points.   The patient's weight 198 pounds with a height of 65 (inches), resulting in a BMI of 33.1 kg/m2. The patient's neck circumference measured 17 inches. History of CAD, Depression, Diabetes, Dyspnea, GERD, History of TIA, Hypersomnia with sleep apnea, Hypertension, Lumbago, Neuropathy, PE, Pneumonia, CKD, Sleep Apnea, Stroke, vertigo, vascular dementia, TIA and strokes, and Trigger finger of both hands. Mr. Jezewski had his last office visit on 07-16-2018,dedicated to compliance on CPAP -virtual visit with Amy Lomax, NP.   CURRENT MEDICATIONS: Augmentin, Vitamin C, Aspirin, BD Pen, Peridex, Vitamin D, Voltaren, Avodart, FE, Flonase, Lasix, Humalog, Lactinex, Glucophage, Multivitamin, Mycostatin, Protonix, Lyrica, Altace, Xarelto, Crestor, Valtrex, Oxycodone.  PROCEDURE:  This is a multichannel digital polysomnogram utilizing the SomnoStar 11.2 system.  Electrodes and sensors were applied and monitored per AASM Specifications.   EEG, EOG, Chin and Limb EMG, were sampled at 200 Hz.  ECG, Snore and Nasal Pressure, Thermal Airflow, Respiratory Effort, CPAP Flow and Pressure, Oximetry was sampled at 50 Hz. Digital video and audio were recorded.     CPAP was initiated at 5 cmH20 with heated humidity per AASM split night standards and pressure was advanced to 20  cmH20 because of hypopneas, apneas and desaturations.  At a PAP pressure of 20 cmH20, the patient slept for 29 minutes, 98% efficiency. There was a reduction of the AHI to 4.1/h with improvement of oxygen saturation and sleep apnea. The patient used his own ResMed FFM, the F 20 in size medium.    Lights Out was at 22:38 and Lights On at 04:58. Total recording time (TRT) was 379 minutes, with a total sleep time (TST) of 335 minutes. The patient's sleep latency was 4 minutes. REM latency was 114 minutes.  The sleep efficiency was 88.4 %.    SLEEP ARCHITECTURE: WASO (Wake after sleep onset) was 43.5 minutes.  There were 1.5 minutes in Stage N1, 284 minutes Stage N2, 12.5 minutes Stage N3 and 37 minutes in Stage REM.  The percentage of Stage N1 was .4%, Stage N2 was 84.8%, Stage N3 was 3.7% and Stage R (REM sleep) was 11.%. The sleep architecture was notable for unfragmented sleep.  RESPIRATORY ANALYSIS:  There was a total of 108 respiratory events: 91 obstructive apneas, 1 central apnea and 16 hypopneas. The patient also had many respiratory event related arousals (RERAs).     The total APNEA/HYPOPNEA INDEX (AHI) was 19.3 /hour and the total RESPIRATORY DISTURBANCE INDEX was 38 /hour.  9 events occurred in REM sleep and 99 events in NREM.  The REM AHI was 14.6 /hour versus a non-REM AHI of 19.9 /hour.  The patient spent 227.5 minutes of total sleep time in the supine position and 108 minutes in non-supine.  The supine AHI was 28.5, versus a non-supine  AHI of 0.0.  OXYGEN SATURATION & C02:  The baseline 02 saturation was 94%, with the lowest being 78%. Time spent below 89% saturation equaled 50 minutes. The arousals were noted as: 19 were spontaneous, 0 were associated with PLMs, and 70 were associated with respiratory events. Strongly supine dependent apnea.  The patient had a total of 0 Periodic Limb Movements.  Audio and video analysis did not show any abnormal or unusual movements, behaviors,  phonations or vocalizations.  EKG was in keeping with normal sinus rhythm (NSR).  DIAGNOSIS 1. Severe Obstructive Sleep Apnea was only partially controlled under CPAP pressure of 20 cm water, using a FFM. Strong supine dependent sleep apnea. 2. Intermittent, brief sleep related Hypoxemia for 50 minutes of the TST. 3. 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    PLANS/RECOMMENDATIONS: I am not sure if the patient can tolerate 20 cm water pressure CPAP. He is 78 years old, has had multiple strokes and uses a FFM which makes it difficult to sleep on his side. The patient used his own ResMed FFM, the F 20 in size medium.   1.   2. I will give him an autotitration capable CPAP machine with heated humidity set to 9 through 20 cm water, 1 cm EPR. If he is not tolerating this setting, I will invite him to return for a BiPAP titration as requested.  3. All PAP therapy compliance is defined as 4 hours or more of nightly use. Any apnea patient should avoid sedatives, hypnotics, and alcoholic beverage consumption before bedtime.   PS:  Can we get him this machine as a rental? If it works well for him, he can stay on it.   A follow up appointment will be scheduled in the Sleep Clinic at [facility].   Please call [phone] with any questions.     I certify that I have reviewed the entire raw data recording prior to the issuance of this report in accordance with the Standards of Accreditation of the American Academy of Sleep Medicine (AASM)     Larey Seat, M.D.   10-21-2018 Diplomat, American Board of Psychiatry and Neurology  Diplomat, [place] of Sleep Medicine Medical Director, [place] [facility] at [facility]

## 2018-10-21 NOTE — Addendum Note (Signed)
Addended by: Larey Seat on: 10/21/2018 05:56 PM   Modules accepted: Orders

## 2018-10-22 ENCOUNTER — Telehealth: Payer: Self-pay | Admitting: Neurology

## 2018-10-22 NOTE — Telephone Encounter (Signed)
-----   Message from Larey Seat, MD sent at 10/21/2018  5:56 PM EDT -----  DIAGNOSIS 1. Severe Obstructive Sleep Apnea was only partially controlled under CPAP pressure of 20 cm water, using a FFM. Strong supine dependent sleep apnea was noted here ( in contrast to the baseline study). 2. Intermittent, brief sleep related Hypoxemia for 50 minutes of the TST. 3. 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    PLANS/RECOMMENDATIONS: I am not sure if the patient can tolerate 20 cm water pressure CPAP. He is 78 years old, has had multiple strokes and uses a FFM which makes it difficult to sleep on his side. The patient used his own ResMed FFM, the F 20 in size medium.      I will give him an autotitration capable CPAP machine with heated humidity set to 9 through 20 cm water, 1 cm EPR. If he is not tolerating this setting, I will invite him to return for a BiPAP titration as requested.    All PAP therapy compliance is defined as 4 hours or more of nightly use. Any apnea patient should avoid sedatives, hypnotics, and alcoholic beverage consumption before bedtime.   PS:  Can we get him this machine as a rental? If it works well for him, he can stay on it.

## 2018-10-22 NOTE — Telephone Encounter (Signed)
Called patient to discuss sleep study results. No answer at this time. LVM for the patient to call back.   

## 2018-10-23 NOTE — Telephone Encounter (Signed)
Pt returning call please call back °

## 2018-10-23 NOTE — Telephone Encounter (Signed)
Called the patient back to advise of the sleep study results. Reviewed the study with him. Patient was already set up with a new machine through [facility] on 09/24/2018. Informed I would send the order for the pressure changes to [facility] and got him on the books for a sleep initial cpap follow up. Pt verbalized understanding. 11/28/2018 at 11:30 am with Ward Givens, NP

## 2018-10-28 DIAGNOSIS — Z8546 Personal history of malignant neoplasm of prostate: Secondary | ICD-10-CM | POA: Diagnosis not present

## 2018-10-28 DIAGNOSIS — R350 Frequency of micturition: Secondary | ICD-10-CM | POA: Diagnosis not present

## 2018-10-28 DIAGNOSIS — N5201 Erectile dysfunction due to arterial insufficiency: Secondary | ICD-10-CM | POA: Diagnosis not present

## 2018-11-07 ENCOUNTER — Encounter (HOSPITAL_BASED_OUTPATIENT_CLINIC_OR_DEPARTMENT_OTHER): Payer: Medicare Other | Admitting: Internal Medicine

## 2018-11-14 ENCOUNTER — Other Ambulatory Visit: Payer: Self-pay

## 2018-11-14 ENCOUNTER — Encounter (HOSPITAL_BASED_OUTPATIENT_CLINIC_OR_DEPARTMENT_OTHER): Payer: Medicare Other | Attending: Internal Medicine | Admitting: Internal Medicine

## 2018-11-14 DIAGNOSIS — Z8546 Personal history of malignant neoplasm of prostate: Secondary | ICD-10-CM | POA: Insufficient documentation

## 2018-11-14 DIAGNOSIS — K219 Gastro-esophageal reflux disease without esophagitis: Secondary | ICD-10-CM | POA: Insufficient documentation

## 2018-11-14 DIAGNOSIS — Z86711 Personal history of pulmonary embolism: Secondary | ICD-10-CM | POA: Insufficient documentation

## 2018-11-14 DIAGNOSIS — Z86718 Personal history of other venous thrombosis and embolism: Secondary | ICD-10-CM | POA: Diagnosis not present

## 2018-11-14 DIAGNOSIS — E785 Hyperlipidemia, unspecified: Secondary | ICD-10-CM | POA: Insufficient documentation

## 2018-11-14 DIAGNOSIS — E1136 Type 2 diabetes mellitus with diabetic cataract: Secondary | ICD-10-CM | POA: Diagnosis not present

## 2018-11-14 DIAGNOSIS — M1990 Unspecified osteoarthritis, unspecified site: Secondary | ICD-10-CM | POA: Diagnosis not present

## 2018-11-14 DIAGNOSIS — S91302A Unspecified open wound, left foot, initial encounter: Secondary | ICD-10-CM | POA: Diagnosis not present

## 2018-11-14 DIAGNOSIS — N400 Enlarged prostate without lower urinary tract symptoms: Secondary | ICD-10-CM | POA: Insufficient documentation

## 2018-11-14 DIAGNOSIS — Z794 Long term (current) use of insulin: Secondary | ICD-10-CM | POA: Insufficient documentation

## 2018-11-14 DIAGNOSIS — I2510 Atherosclerotic heart disease of native coronary artery without angina pectoris: Secondary | ICD-10-CM | POA: Diagnosis not present

## 2018-11-14 DIAGNOSIS — I10 Essential (primary) hypertension: Secondary | ICD-10-CM | POA: Diagnosis not present

## 2018-11-14 DIAGNOSIS — E11621 Type 2 diabetes mellitus with foot ulcer: Secondary | ICD-10-CM | POA: Insufficient documentation

## 2018-11-14 DIAGNOSIS — G4733 Obstructive sleep apnea (adult) (pediatric): Secondary | ICD-10-CM | POA: Diagnosis not present

## 2018-11-14 DIAGNOSIS — E1042 Type 1 diabetes mellitus with diabetic polyneuropathy: Secondary | ICD-10-CM | POA: Diagnosis not present

## 2018-11-14 DIAGNOSIS — S91301A Unspecified open wound, right foot, initial encounter: Secondary | ICD-10-CM | POA: Diagnosis not present

## 2018-11-14 DIAGNOSIS — F0150 Vascular dementia without behavioral disturbance: Secondary | ICD-10-CM | POA: Diagnosis not present

## 2018-11-14 DIAGNOSIS — I69359 Hemiplegia and hemiparesis following cerebral infarction affecting unspecified side: Secondary | ICD-10-CM | POA: Diagnosis not present

## 2018-11-18 NOTE — Progress Notes (Signed)
Steve Brown, Steve Brown ([id number]) Visit Report for 11/14/2018 Chief Complaint Document Details Patient Name: Date of Service: Steve Brown, Steve Brown 11/14/2018 10:30 AM Medical Record [id number] Patient Account Number: [id number] Date of Birth/Sex: Treating RN: [birth year] (78 y.o. Steve Brown Primary Care Provider: Lona Kettle Other Clinician: Referring Provider: Treating Provider/Extender:Cianna Kasparian, Steve Brown, Steve Brown in Treatment: 0 Information Obtained from: Patient Chief Complaint Patient arrived for evaluation of his right lower extremity 11/14/2018; patient is again back in clinic with complaints of wounds between his bilateral toes Electronic Signature(s) Signed: 11/14/2018 5:45:12 PM By: Linton Ham MD Entered By: Linton Ham on 11/14/2018 11:59:30 -------------------------------------------------------------------------------- HPI Details Patient Name: Date of Service: Steve Brown, Steve Brown 11/14/2018 10:30 AM Medical Record [id number] Patient Account Number: [id number] Date of Birth/Sex: Treating RN: [birth year] (78 y.o. Steve Brown Primary Care Provider: Lona Kettle Other Clinician: Referring Provider: Treating Provider/Extender:Elazar Argabright, Steve Brown, Steve Brown in Treatment: 0 History of Present Illness HPI Description: 2/28/118- the patient arrives for evaluation of his right lower extremity. He had a wound to the anterior aspect of his right lower extremity, originally a traumatic injury. He has been treating this with over-the- counter triple antibiotic ointment and leaving open to air. He is a diabetic with his most recent A1c is 7.8, which is a trend down from 9. He is controlled with oral agents and insulin. He denies any residual pain to his leg. He wanted to be seen to confirm that no additional testing or intervention needed to be performed. His wound is completely epithelialized. Readmission: 09/25/2018 on evaluation today patient  presents for reevaluation in our clinic although he has not been seen actually since February 2018. Today he is seen due to what sounds to be maceration breakdown between his toes that he noted. With that being said he was advised by the pharmacist to use new skin to try to help clear this up. He has been putting out between his toes and to be partly honest it seems like it is done well for him in fact he seems to be excellent upon evaluation today. There is no signs of any infection in fact there is no signs of anything even open at this time. READMISSION 11/14/2018 This is a patient we have seen twice in this clinic before out of concern for lower extremity wounds. In February 2018 he apparently had a wound on his right lower extremity but he was heel by the time he got to our clinic. On September 25, 2018 he was seen here because of wounds between his toes again when he got here there was no open wound. He apparently does have a history of tinea pedis according the patient although we have never seen this. He does not have neuropathy or PAD that we know about. He comes in today complaining of wounds between his bilateral fourth and fifth toes Past medical history; type 2 diabetes, vascular dementia, obstructive sleep apnea, hypertension, hyperlipidemia, history of vertebral artery occlusion prostate CA. Electronic Signature(s) Signed: 11/14/2018 5:45:12 PM By: Linton Ham MD Entered By: Linton Ham on 11/14/2018 12:01:44 -------------------------------------------------------------------------------- Physical Exam Details Patient Name: Date of Service: Steve Brown, Steve Brown 11/14/2018 10:30 AM Medical Record [id number] Patient Account Number: [id number] Date of Birth/Sex: Treating RN: [birth year] (78 y.o. Steve Brown Primary Care Provider: Lona Kettle Other Clinician: Referring Provider: Treating Provider/Extender:Jariel Drost, Steve Brown, Steve Brown in Treatment:  0 Constitutional Sitting or standing Blood Pressure is within target range for patient.. Pulse regular and within target range for patient.Marland Kitchen Respirations regular, non-labored and within target  range.. Temperature is normal and within the target range for the patient.Marland Kitchen Appears in no distress. Eyes Conjunctivae clear. No discharge.no icterus. Respiratory work of breathing is normal. Cardiovascular Pedal pulses palpable and strong bilaterally.. Integumentary (Hair, Skin) No primary cutaneous issue is seen. Neurological Normal to vibration and light touch. Psychiatric appears at normal baseline. Notes Wound exam; all of his toe web spaces are examined. He does not have any open wounds. Nor does he have any tinea pedis. He does have an indent in the lateral fourth toes bilaterally where his fifth toes pressed into these I wonder if this is what he is experience are seen Electronic Signature(s) Signed: 11/14/2018 5:45:12 PM By: Linton Ham MD Entered By: Linton Ham on 11/14/2018 12:03:50 -------------------------------------------------------------------------------- Physician Orders Details Patient Name: Date of Service: Steve Brown, Steve Brown 11/14/2018 10:30 AM Medical Record [id number] Patient Account Number: [id number] Date of Birth/Sex: Treating RN: 04-19-1940 (78 y.o. Steve Brown Primary Care Provider: Lona Kettle Other Clinician: Referring Provider: Treating Provider/Extender:Taron Conrey, Steve Brown, Steve Brown in Treatment: 0 Verbal / Phone Orders: No Diagnosis Coding [facility]charge From [facility] Services Discharge from [place] - call if you need assistance with any open wounds Skin Barriers/Peri-Wound Care Moisturizing lotion - daily, at night Secondary Dressing Other: - patient to keep 4th and 5th toes separate with gauze or recommendations from podiatry Additional Orders / Instructions Other: - follow up with podiatry Electronic  Signature(s) Signed: 11/14/2018 5:16:49 PM By: Kela Millin Signed: 11/14/2018 5:45:12 PM By: Linton Ham MD Entered By: Kela Millin on 11/14/2018 11:40:41 -------------------------------------------------------------------------------- Problem List Details Patient Name: Date of Service: Steve Brown, Steve Brown 11/14/2018 10:30 AM Medical Record [id number] Patient Account Number: [id number] Date of Birth/Sex: Treating RN: 1940/08/24 (78 y.o. Steve Brown Primary Care Provider: Lona Kettle Other Clinician: Referring Provider: Treating Provider/Extender:Oleta Gunnoe, Steve Brown, Steve Brown in Treatment: 0 Active Problems ICD-10 Evaluated Encounter Evaluated Encounter Code Description Active Date Today Diagnosis E11.621 Type 2 diabetes mellitus with foot ulcer 11/14/2018 No Yes Inactive Problems Resolved Problems Electronic Signature(s) Signed: 11/14/2018 5:45:12 PM By: Linton Ham MD Entered By: Linton Ham on 11/14/2018 11:58:31 -------------------------------------------------------------------------------- Progress Note Details Patient Name: Date of Service: Steve Brown, Steve Brown 11/14/2018 10:30 AM Medical Record [id number] Patient Account Number: [id number] Date of Birth/Sex: Treating RN: 02-Apr-1940 (78 y.o. Steve Brown Primary Care Provider: Lona Kettle Other Clinician: Referring Provider: Treating Provider/Extender:Britton Bera, Steve Brown, Steve Brown in Treatment: 0 Subjective Chief Complaint Information obtained from Patient Patient arrived for evaluation of his right lower extremity 11/14/2018; patient is again back in clinic with complaints of wounds between his bilateral toes History of Present Illness (HPI) 2/28/118- the patient arrives for evaluation of his right lower extremity. He had a wound to the anterior aspect of his right lower extremity, originally a traumatic injury. He has been treating this with over-the-counter triple  antibiotic ointment and leaving open to air. He is a diabetic with his most recent A1c is 7.8, which is a trend down from 9. He is controlled with oral agents and insulin. He denies any residual pain to his leg. He wanted to be seen to confirm that no additional testing or intervention needed to be performed. His wound is completely epithelialized. Readmission: 09/25/2018 on evaluation today patient presents for reevaluation in our clinic although he has not been seen actually since February 2018. Today he is seen due to what sounds to be maceration breakdown between his toes that he noted. With that being said he was advised by the pharmacist to use new skin  to try to help clear this up. He has been putting out between his toes and to be partly honest it seems like it is done well for him in fact he seems to be excellent upon evaluation today. There is no signs of any infection in fact there is no signs of anything even open at this time. READMISSION 11/14/2018 This is a patient we have seen twice in this clinic before out of concern for lower extremity wounds. In February 2018 he apparently had a wound on his right lower extremity but he was heel by the time he got to our clinic. On September 25, 2018 he was seen here because of wounds between his toes again when he got here there was no open wound. He apparently does have a history of tinea pedis according the patient although we have never seen this. He does not have neuropathy or PAD that we know about. He comes in today complaining of wounds between his bilateral fourth and fifth toes Past medical history; type 2 diabetes, vascular dementia, obstructive sleep apnea, hypertension, hyperlipidemia, history of vertebral artery occlusion prostate CA. Patient History Information obtained from Patient. Allergies Phenergan (Severity: Moderate), tramadol HCl (Severity: Severe, Reaction: "makes him crazy") Family History Cancer - Father, Diabetes  - Siblings, Seizures - Siblings, Stroke - Mother,Siblings, No family history of Heart Disease, Hereditary Spherocytosis, Hypertension, Kidney Disease, Lung Disease, Thyroid Problems, Tuberculosis. Social History Never smoker, Marital Status - Divorced, Alcohol Use - Never, Drug Use - No History, Caffeine Use - Daily - coffee. Medical History Eyes Patient has history of Cataracts - bilateral removed Denies history of Glaucoma, Optic Neuritis Ear/Nose/Mouth/Throat Denies history of Chronic sinus problems/congestion, Middle ear problems Hematologic/Lymphatic Denies history of Anemia, Hemophilia, Human Immunodeficiency Virus, Lymphedema, Sickle Cell Disease Respiratory Patient has history of Sleep Apnea Denies history of Aspiration, Asthma, Chronic Obstructive Pulmonary Disease (COPD), Pneumothorax, Tuberculosis Cardiovascular Patient has history of Coronary Artery Disease, Deep Vein Thrombosis, Hypertension Denies history of Angina, Arrhythmia, Congestive Heart Failure, Hypotension, Myocardial Infarction, Peripheral Arterial Disease, Peripheral Venous Disease, Phlebitis, Vasculitis Gastrointestinal Denies history of Cirrhosis , Colitis, Crohnoos, Hepatitis A, Hepatitis B, Hepatitis C Endocrine Patient has history of Type II Diabetes Denies history of Type I Diabetes Genitourinary Denies history of End Stage Renal Disease Immunological Denies history of Lupus Erythematosus, Raynaudoos, Scleroderma Integumentary (Skin) Denies history of History of Burn Musculoskeletal Patient has history of Osteoarthritis Denies history of Gout, Rheumatoid Arthritis, Osteomyelitis Neurologic Patient has history of Neuropathy Denies history of Dementia, Quadriplegia, Paraplegia, Seizure Disorder Oncologic Patient has history of Received Radiation - prostatre Denies history of Received Chemotherapy Psychiatric Patient has history of Confinement Anxiety Denies history of  Anorexia/bulimia Hospitalization/Surgery History - Cardiac Cath. - Carpal Tunnel Release. - Cervical Disc Surgery. - Chloecystectomy. - Colonoscopy. - Coronary Angioplasty. - Coronary Stent Placement. - Elbow Surgery. - Eye Surgery. - Hernia Repair. - Left Heart Cath w/ Coronary Angiogram. - Fluid pumped off Lungs. Medical And Surgical History Notes Hematologic/Lymphatic Hyperlipidemia Respiratory Pulmonary Embolism Gastrointestinal Cholecystitis GERD Genitourinary Prostatic Hyperplasia Musculoskeletal Lumbago Joint Pain Degenerative Disc Neurologic Hemiparesis Recurrent TIA Stroke Syncope Unsteady Gait White Matter Disease Review of Systems (ROS) Constitutional Symptoms (General Health) Denies complaints or symptoms of Fatigue, Fever, Chills, Marked Weight Change. Eyes Denies complaints or symptoms of Dry Eyes, Vision Changes, Glasses / Contacts. Ear/Nose/Mouth/Throat Denies complaints or symptoms of Chronic sinus problems or rhinitis. Gastrointestinal Denies complaints or symptoms of Frequent diarrhea, Nausea, Vomiting. Integumentary (Skin) Denies complaints or symptoms of Wounds. Musculoskeletal Denies  complaints or symptoms of Muscle Pain, Muscle Weakness. Objective Constitutional Sitting or standing Blood Pressure is within target range for patient.. Pulse regular and within target range for patient.Marland Kitchen Respirations regular, non-labored and within target range.. Temperature is normal and within the target range for the patient.Marland Kitchen Appears in no distress. Vitals Time Taken: 11:03 AM, Height: 65 in, Source: Stated, Weight: 200 lbs, Source: Stated, BMI: 33.3, Temperature: 98.3 F, Pulse: 60 bpm, Respiratory Rate: 18 breaths/min, Blood Pressure: 124/63 mmHg, Capillary Blood Glucose: 234 mg/dl. General Notes: glucose per pt report Eyes Conjunctivae clear. No discharge.no icterus. Respiratory work of breathing is normal. Cardiovascular Pedal pulses palpable and strong  bilaterally.. Neurological Normal to vibration and light touch. Psychiatric appears at normal baseline. General Notes: Wound exam; all of his toe web spaces are examined. He does not have any open wounds. Nor does he have any tinea pedis. He does have an indent in the lateral fourth toes bilaterally where his fifth toes pressed into these I wonder if this is what he is experience are seen Integumentary (Hair, Skin) No primary cutaneous issue is seen. Assessment Active Problems ICD-10 Type 2 diabetes mellitus with foot ulcer Plan Discharge From [facility] Services: Discharge from [place] - call if you need assistance with any open wounds Skin Barriers/Peri-Wound Care: Moisturizing lotion - daily, at night Secondary Dressing: Other: - patient to keep 4th and 5th toes separate with gauze or recommendations from podiatry Additional Orders / Instructions: Other: - follow up with podiatry 1. The patient is going to his podiatrist at [facility] this afternoon at 230 2. He does not need to be followed here he has no open wounds in fact he does not have much in the way of diabetic complications. No evidence of peripheral neuropathy I have no evidence of PAD 3. I wonder about keeping his fourth and fifth toes separate with gauze or recommendations from podiatry. Electronic Signature(s) Signed: 11/14/2018 5:45:12 PM By: Linton Ham MD Entered By: Linton Ham on 11/14/2018 12:04:47 -------------------------------------------------------------------------------- HxROS Details Patient Name: Date of Service: Steve Brown, Steve Brown 11/14/2018 10:30 AM Medical Record [id number] Patient Account Number: [id number] Date of Birth/Sex: Treating RN: 04/23/1940 (78 y.o. Steve Brown Primary Care Provider: Other Clinician: Lona Kettle Referring Provider: Treating Provider/Extender:Cato Liburd, Steve Brown, Steve Brown in Treatment: 0 Information Obtained  From Patient Constitutional Symptoms (General Health) Complaints and Symptoms: Negative for: Fatigue; Fever; Chills; Marked Weight Change Eyes Complaints and Symptoms: Negative for: Dry Eyes; Vision Changes; Glasses / Contacts Medical History: Positive for: Cataracts - bilateral removed Negative for: Glaucoma; Optic Neuritis Ear/Nose/Mouth/Throat Complaints and Symptoms: Negative for: Chronic sinus problems or rhinitis Medical History: Negative for: Chronic sinus problems/congestion; Middle ear problems Gastrointestinal Complaints and Symptoms: Negative for: Frequent diarrhea; Nausea; Vomiting Medical History: Negative for: Cirrhosis ; Colitis; Crohns; Hepatitis A; Hepatitis B; Hepatitis C Past Medical History Notes: Cholecystitis GERD Integumentary (Skin) Complaints and Symptoms: Negative for: Wounds Medical History: Negative for: History of Burn Musculoskeletal Complaints and Symptoms: Negative for: Muscle Pain; Muscle Weakness Medical History: Positive for: Osteoarthritis Negative for: Gout; Rheumatoid Arthritis; Osteomyelitis Past Medical History Notes: Lumbago Joint Pain Degenerative Disc Hematologic/Lymphatic Medical History: Negative for: Anemia; Hemophilia; Human Immunodeficiency Virus; Lymphedema; Sickle Cell Disease Past Medical History Notes: Hyperlipidemia Respiratory Medical History: Positive for: Sleep Apnea Negative for: Aspiration; Asthma; Chronic Obstructive Pulmonary Disease (COPD); Pneumothorax; Tuberculosis Past Medical History Notes: Pulmonary Embolism Cardiovascular Medical History: Positive for: Coronary Artery Disease; Deep Vein Thrombosis; Hypertension Negative for: Angina; Arrhythmia; Congestive Heart Failure; Hypotension;  Myocardial Infarction; Peripheral Arterial Disease; Peripheral Venous Disease; Phlebitis; Vasculitis Endocrine Medical History: Positive for: Type II Diabetes Negative for: Type I Diabetes Time with diabetes:  since 1986 Treated with: Insulin, Oral agents Blood sugar tested every day: Yes Tested : 2 times per day Blood sugar testing results: Breakfast: 130; Dinner: 130-150 Genitourinary Medical History: Negative for: End Stage Renal Disease Past Medical History Notes: Prostatic Hyperplasia Immunological Medical History: Negative for: Lupus Erythematosus; Raynauds; Scleroderma Neurologic Medical History: Positive for: Neuropathy Negative for: Dementia; Quadriplegia; Paraplegia; Seizure Disorder Past Medical History Notes: Hemiparesis Recurrent TIA Stroke Syncope Unsteady Gait White Matter Disease Oncologic Medical History: Positive for: Received Radiation - prostatre Negative for: Received Chemotherapy Psychiatric Medical History: Positive for: Confinement Anxiety Negative for: Anorexia/bulimia HBO Extended History Items Eyes: Cataracts Immunizations Pneumococcal Vaccine: Received Pneumococcal Vaccination: Yes Implantable Devices No devices added Hospitalization / Surgery History Type of Hospitalization/Surgery Cardiac Cath Carpal Tunnel Release Cervical Disc Surgery Chloecystectomy Colonoscopy Coronary Angioplasty Coronary Stent Placement Elbow Surgery Eye Surgery Hernia Repair Left Heart Cath w/ Coronary Angiogram Fluid pumped off Lungs Family and Social History Cancer: Yes - Father; Diabetes: Yes - Siblings; Heart Disease: No; Hereditary Spherocytosis: No; Hypertension: No; Kidney Disease: No; Lung Disease: No; Seizures: Yes - Siblings; Stroke: Yes - Mother,Siblings; Thyroid Problems: No; Tuberculosis: No; Never smoker; Marital Status - Divorced; Alcohol Use: Never; Drug Use: No History; Caffeine Use: Daily - coffee; Financial Concerns: No; Food, Clothing or Shelter Needs: No; Support System Lacking: No; Transportation Concerns: No Electronic Signature(s) Signed: 11/14/2018 5:45:12 PM By: Linton Ham MD Signed: 11/18/2018 5:46:53 PM By: Levan Hurst RN,  BSN Entered By: Levan Hurst on 11/14/2018 11:09:12 -------------------------------------------------------------------------------- SuperBill Details Patient Name: Date of Service: STATEN, SALMO 11/14/2018 Medical Record [id number] Patient Account Number: [id number] Date of Birth/Sex: Treating RN: 20-Dec-1940 (78 y.o. Steve Brown Primary Care Provider: Lona Kettle Other Clinician: Referring Provider: Treating Provider/Extender:Therma Lasure, Steve Brown, Steve Brown in Treatment: 0 Diagnosis Coding ICD-10 Codes Code Description E11.621 Type 2 diabetes mellitus with foot ulcer Facility Procedures The patient participates with Medicare or their insurance follows the Medicare Facility Guidelines: CPT4 Code Description Modifier Quantity [id number] [place] VISIT-LEV 4 EST PT 1 Physician Procedures CPT4 Code: [id number] Description: [id number] - WC PHYS LEVEL 3 - EST PT ICD-10 Diagnosis Description E11.621 Type 2 diabetes mellitus with foot ulcer Modifier: Quantity: 1 Electronic Signature(s) Signed: 11/14/2018 5:45:12 PM By: Linton Ham MD Entered By: Linton Ham on 11/14/2018 12:05:02

## 2018-11-18 NOTE — Progress Notes (Signed)
ADHVIK, NANNA ([id number]) Visit Report for 11/14/2018 Allergy List Details Patient Name: Date of Service: MURIEL, OHM 11/14/2018 10:30 AM Medical Record [id number] Patient Account Number: [id number] Date of Birth/Sex: Treating RN: [birth year] (78 y.o. Janyth Contes Primary Care Trew Sunde: Lona Kettle Other Clinician: Referring Gray Doering: Treating Pheonix Wisby/Extender:Robson, Archie Patten, CHARLES Weeks in Treatment: 0 Allergies Active Allergies Phenergan Severity: Moderate tramadol HCl Reaction: "makes him crazy" Severity: Severe Allergy Notes Electronic Signature(s) Signed: 11/18/2018 5:46:53 PM By: Levan Hurst RN, BSN Entered By: Levan Hurst on 11/14/2018 11:04:38 -------------------------------------------------------------------------------- Trail Details Patient Name: Date of Service: MCHALE, HEINZMANN 11/14/2018 10:30 AM Medical Record [id number] Patient Account Number: [id number] Date of Birth/Sex: Treating RN: January 27, 1940 (78 y.o. Janyth Contes Primary Care Zayquan Bogard: Lona Kettle Other Clinician: Referring Ashir Kunz: Treating Calvary Difranco/Extender:Robson, Archie Patten, Docia Barrier in Treatment: 0 Visit Information Patient Arrived: Ambulatory Arrival Time: 11:00 Accompanied By: alone Transfer Assistance: None Patient Identification Verified: Yes Secondary Verification Process Yes Completed: Patient Requires Transmission- No Patient Requires Transmission- No Based Precautions: Patient Has Alerts: Yes Patient Alerts: Patient on Blood Thinner History Since Last Visit Added or deleted any medications: No Any new allergies or adverse reactions: No Had a fall or experienced change in activities of daily living that may affect risk of falls: No Signs or symptoms of abuse/neglect since last visito No Hospitalized since last visit: No Implantable device outside of the clinic excluding No cellular tissue based products placed  in the center since last visit: Pain Present Now: No Electronic Signature(s) Signed: 11/18/2018 5:46:53 PM By: Levan Hurst RN, BSN Entered By: Levan Hurst on 11/14/2018 11:02:19 -------------------------------------------------------------------------------- Clinic Level of Care Assessment Details Patient Name: Date of Service: WILLIAMS, STROMGREN 11/14/2018 10:30 AM Medical Record [id number] Patient Account Number: [id number] Date of Birth/Sex: Treating RN: 25-Jul-1940 (78 y.o. Marvis Repress Primary Care Nakari Bracknell: Lona Kettle Other Clinician: Referring Raif Chachere: Treating Syrita Dovel/Extender:Robson, Archie Patten, CHARLES Weeks in Treatment: 0 Clinic Level of Care Assessment Items TOOL 2 Quantity Score X - Use when only an EandM is performed on the INITIAL visit 1 0 ASSESSMENTS - Nursing Assessment / Reassessment X - General Physical Exam (combine w/ comprehensive assessment (listed just below) 1 20 when performed on new pt. evals) X - Comprehensive Assessment (HX, ROS, Risk Assessments, Wounds Hx, etc.) 1 25 ASSESSMENTS - Wound and Skin Assessment / Reassessment []  - Simple Wound Assessment / Reassessment - one wound 0 []  - Complex Wound Assessment / Reassessment - multiple wounds 0 X - Dermatologic / Skin Assessment (not related to wound area) 1 10 ASSESSMENTS - Ostomy and/or Continence Assessment and Care []  - Incontinence Assessment and Management 0 []  - Ostomy Care Assessment and Management (repouching, etc.) 0 PROCESS - Coordination of Care X - Simple Patient / Family Education for ongoing care 1 15 []  - Complex (extensive) Patient / Family Education for ongoing care 0 X - Staff obtains Programmer, systems, Records, Test Results / Process Orders 1 10 []  - Staff telephones HHA, Nursing Homes / Clarify orders / etc 0 []  - Routine Transfer to another Facility (non-emergent condition) 0 []  - Routine Hospital Admission (non-emergent condition) 0 X - New Admissions / Medical laboratory scientific officer / Ordering NPWT, Apligraf, etc. 1 15 []  - Emergency Hospital Admission (emergent condition) 0 X - Simple Discharge Coordination 1 10 []  - Complex (extensive) Discharge Coordination 0 PROCESS - Special Needs []  - Pediatric / Minor Patient Management 0 []  - Isolation Patient Management 0 []  - Hearing / Language / Visual special needs 0 X - Assessment  of Community assistance (transportation, D/C planning, etc.) 1 15 []  - Additional assistance / Altered mentation 0 []  - Support Surface(s) Assessment (bed, cushion, seat, etc.) 0 INTERVENTIONS - Wound Cleansing / Measurement []  - Wound Imaging (photographs - any number of wounds) 0 []  - Wound Tracing (instead of photographs) 0 []  - Simple Wound Measurement - one wound 0 []  - Complex Wound Measurement - multiple wounds 0 []  - Simple Wound Cleansing - one wound 0 []  - Complex Wound Cleansing - multiple wounds 0 INTERVENTIONS - Wound Dressings []  - Small Wound Dressing one or multiple wounds 0 []  - Medium Wound Dressing one or multiple wounds 0 []  - Large Wound Dressing one or multiple wounds 0 []  - Application of Medications - injection 0 INTERVENTIONS - Miscellaneous []  - External ear exam 0 []  - Specimen Collection (cultures, biopsies, blood, body fluids, etc.) 0 []  - Specimen(s) / Culture(s) sent or taken to Lab for analysis 0 []  - Patient Transfer (multiple staff / Civil Service fast streamer / Similar devices) 0 []  - Simple Staple / Suture removal (25 or less) 0 []  - Complex Staple / Suture removal (26 or more) 0 []  - Hypo / Hyperglycemic Management (close monitor of Blood Glucose) 0 []  - Ankle / Brachial Index (ABI) - do not check if billed separately 0 Has the patient been seen at the hospital within the last three years: Yes Total Score: 120 Level Of Care: New/Established - Level 4 Electronic Signature(s) Signed: 11/14/2018 5:16:49 PM By: Kela Millin Entered By: Kela Millin on 11/14/2018  11:38:58 -------------------------------------------------------------------------------- Lower Extremity Assessment Details Patient Name: Date of Service: RHONDA, LEDSOME 11/14/2018 10:30 AM Medical Record [id number] Patient Account Number: [id number] Date of Birth/Sex: Treating RN: [birth year] (78 y.o. Janyth Contes Primary Care Nyree Applegate: Lona Kettle Other Clinician: Referring Tahiri Shareef: Treating Trellis Vanoverbeke/Extender:Robson, Archie Patten, CHARLES Weeks in Treatment: 0 Vascular Assessment Pulses: Dorsalis Pedis Palpable: [Left:Yes] [Right:Yes] Electronic Signature(s) Signed: 11/18/2018 5:46:53 PM By: Levan Hurst RN, BSN Entered By: Levan Hurst on 11/14/2018 11:12:49 -------------------------------------------------------------------------------- Multi Wound Chart Details Patient Name: Date of Service: LASHAUN, HYMON 11/14/2018 10:30 AM Medical Record [id number] Patient Account Number: [id number] Date of Birth/Sex: Treating RN: 12-27-1940 (78 y.o. Hessie Diener Primary Care Deanna Boehlke: Lona Kettle Other Clinician: Referring Pamala Hayman: Treating Katiya Fike/Extender:Robson, Archie Patten, CHARLES Weeks in Treatment: 0 Vital Signs Height(in): 65 Capillary Blood 234 Glucose(mg/dl): Weight(lbs): 200 Pulse(bpm): 60 Body Mass Index(BMI): 33 Blood Pressure(mmHg): 124/63 Temperature(F): 98.3 Respiratory 18 Rate(breaths/min): Wound Assessments Treatment Notes Electronic Signature(s) Signed: 11/14/2018 5:30:28 PM By: Deon Pilling Signed: 11/14/2018 5:45:12 PM By: Linton Ham MD Entered By: Linton Ham on 11/14/2018 11:58:38 -------------------------------------------------------------------------------- Multi-Disciplinary Care Plan Details Patient Name: Date of Service: EXZAVIER, BACKES 11/14/2018 10:30 AM Medical Record [id number] Patient Account Number: [id number] Date of Birth/Sex: Treating RN: [birth year] (78 y.o. Marvis Repress Primary Care Mayley Lish: Lona Kettle Other Clinician: Referring Dacota Devall: Treating Dorothye Berni/Extender:Robson, Archie Patten, Docia Barrier in Treatment: 0 Active Inactive Electronic Signature(s) Signed: 11/14/2018 5:16:49 PM By: Kela Millin Entered By: Kela Millin on 11/14/2018 11:37:34 -------------------------------------------------------------------------------- Pain Assessment Details Patient Name: Date of Service: ZYHEIR, EASTER 11/14/2018 10:30 AM Medical Record [id number] Patient Account Number: [id number] Date of Birth/Sex: Treating RN: Aug 06, 1940 (78 y.o. Janyth Contes Primary Care Stephane Junkins: Lona Kettle Other Clinician: Referring Kilynn Fitzsimmons: Treating Tylen Leverich/Extender:Robson, Archie Patten, CHARLES Weeks in Treatment: 0 Active Problems Location of Pain Severity and Description of Pain Patient Has Paino No Site Locations Pain Management and Medication Current Pain Management: Electronic Signature(s) Signed: 11/18/2018 5:46:53 PM By: Levan Hurst RN, BSN  Entered By: Levan Hurst on 11/14/2018 11:12:56 -------------------------------------------------------------------------------- Patient/Caregiver Education Details Patient Name: Date of Service: MAGGIE, KRIPPNER 11/5/2020andnbsp10:30 AM Medical Record [phone] Patient Account Number: [id number] Date of Birth/Gender: Treating RN: Nov 02, 1940 (78 y.o. Marvis Repress Primary Care Physician: Lona Kettle Other Clinician: Referring Physician: Treating Physician/Extender:Robson, Archie Patten, Docia Barrier in Treatment: 0 Education Assessment Education Provided To: Patient Education Topics Provided Welcome To [facility] [place]: Handouts: Welcome To [facility] [place] Methods: Explain/Verbal Responses: State content correctly Electronic Signature(s) Signed: 11/14/2018 5:16:49 PM By: Kela Millin Entered By: Kela Millin on 11/14/2018  11:37:46 -------------------------------------------------------------------------------- Vitals Details Patient Name: Date of Service: SHOTARO, LEWY 11/14/2018 10:30 AM Medical Record [id number] Patient Account Number: [id number] Date of Birth/Sex: Treating RN: Sep 25, 1940 (78 y.o. Janyth Contes Primary Care Berk Pilot: Lona Kettle Other Clinician: Referring Aniko Finnigan: Treating Reana Chacko/Extender:Robson, Archie Patten, CHARLES Weeks in Treatment: 0 Vital Signs Time Taken: 11:03 Temperature (F): 98.3 Height (in): 65 Pulse (bpm): 60 Source: Stated Respiratory Rate (breaths/min): 18 Weight (lbs): 200 Blood Pressure (mmHg): 124/63 Source: Stated Capillary Blood Glucose (mg/dl): 234 Body Mass Index (BMI): 33.3 Reference Range: 80 - 120 mg / dl Notes glucose per pt report Electronic Signature(s) Signed: 11/18/2018 5:46:53 PM By: Levan Hurst RN, BSN Entered By: Levan Hurst on 11/14/2018 11:04:31

## 2018-11-18 NOTE — Progress Notes (Signed)
Steve Brown, Steve Brown ([id number]) Visit Report for 11/14/2018 Abuse/Suicide Risk Screen Details Patient Name: Date of Service: Steve Brown, Steve Brown 11/14/2018 10:30 AM Medical Record [id number] Patient Account Number: [id number] Date of Birth/Sex: Treating RN: 10/26/1940 (78 y.o. Janyth Contes Primary Care Korby Ratay: Lona Kettle Other Clinician: Referring Efrain Clauson: Treating Cosandra Plouffe/Extender:Robson, Archie Patten, CHARLES Weeks in Treatment: 0 Abuse/Suicide Risk Screen Items Answer ABUSE RISK SCREEN: Has anyone close to you tried to hurt or harm you recentlyo No Do you feel uncomfortable with anyone in your familyo No Has anyone forced you do things that you didnt want to doo No Electronic Signature(s) Signed: 11/18/2018 5:46:53 PM By: Levan Hurst RN, BSN Entered By: Levan Hurst on 11/14/2018 11:09:19 -------------------------------------------------------------------------------- Activities of Daily Living Details Patient Name: Date of Service: Steve Brown, Steve Brown 11/14/2018 10:30 AM Medical Record [id number] Patient Account Number: [id number] Date of Birth/Sex: Treating RN: May 23, 1940 (78 y.o. Janyth Contes Primary Care Alexxus Sobh: Lona Kettle Other Clinician: Referring Redmond Whittley: Treating Zhyon Antenucci/Extender:Robson, Archie Patten, CHARLES Weeks in Treatment: 0 Activities of Daily Living Items Answer Activities of Daily Living (Please select one for each item) Drive Automobile Completely Able Take Medications Completely Able Use Telephone Completely Able Care for Appearance Completely Able Use Toilet Completely Able Bath / Shower Completely Able Dress Self Completely Able Feed Self Completely Able Walk Completely Able Get In / Out Bed Completely Able Housework Completely Able Prepare Meals Completely Hall Completely Able Shop for Self Completely Able Electronic Signature(s) Signed: 11/18/2018 5:46:53 PM By: Levan Hurst RN, BSN Entered By:  Levan Hurst on 11/14/2018 11:09:58 -------------------------------------------------------------------------------- Education Screening Details Patient Name: Date of Service: Steve Brown, Steve Brown 11/14/2018 10:30 AM Medical Record [id number] Patient Account Number: [id number] Date of Birth/Sex: Treating RN: 04-03-1940 (78 y.o. Janyth Contes Primary Care Earnie Bechard: Lona Kettle Other Clinician: Referring Hulon Ferron: Treating Chandra Feger/Extender:Robson, Archie Patten, Docia Barrier in Treatment: 0 Primary Learner Assessed: Patient Learning Preferences/Education Level/Primary Language Learning Preference: Explanation, Demonstration, Printed Material Highest Education Level: High School Preferred Language: English Cognitive Barrier Language Barrier: No Translator Needed: No Memory Deficit: No Emotional Barrier: No Cultural/Religious Beliefs Affecting Medical Care: No Physical Barrier Impaired Vision: No Impaired Hearing: No Decreased Hand dexterity: No Knowledge/Comprehension Knowledge Level: High Comprehension Level: High Ability to understand written High instructions: Ability to understand verbal High instructions: Motivation Anxiety Level: Calm Cooperation: Cooperative Education Importance: Acknowledges Need Interest in Health Problems: Asks Questions Perception: Coherent Willingness to Engage in Self- High Management Activities: Readiness to Engage in Self- High Management Activities: Electronic Signature(s) Signed: 11/18/2018 5:46:53 PM By: Levan Hurst RN, BSN Entered By: Levan Hurst on 11/14/2018 11:10:21 -------------------------------------------------------------------------------- Fall Risk Assessment Details Patient Name: Date of Service: Steve Brown, Steve Brown 11/14/2018 10:30 AM Medical Record [id number] Patient Account Number: [id number] Date of Birth/Sex: Treating RN: Aug 22, 1940 (78 y.o. Janyth Contes Primary Care Babak Lucus: Lona Kettle Other Clinician: Referring Shaya Reddick: Treating Dea Bitting/Extender:Robson, Archie Patten, Docia Barrier in Treatment: 0 Fall Risk Assessment Items Have you had 2 or more falls in the last 12 monthso 0 No Have you had any fall that resulted in injury in the last 12 monthso 0 No FALLS RISK SCREEN History of falling - immediate or within 3 months 0 No Secondary diagnosis (Do you have 2 or more medical diagnoseso) 15 Yes Ambulatory aid None/bed rest/wheelchair/nurse 0 Yes Crutches/cane/walker 0 No Furniture 0 No Intravenous therapy Access/Saline/Heparin Lock 0 No Weak (short steps with or without shuffle, stooped but able to lift head 0 No while walking, may seek support from furniture) Impaired (short steps with shuffle, may have  difficulty arising from chair, 0 No head down, impaired balance) Mental Status Oriented to own ability 0 Yes Overestimates or forgets limitations 0 No Risk Level: Low Risk Score: 15 Electronic Signature(s) Signed: 11/18/2018 5:46:53 PM By: Levan Hurst RN, BSN Entered By: Levan Hurst on 11/14/2018 11:10:40 -------------------------------------------------------------------------------- Foot Assessment Details Patient Name: Date of Service: Steve Brown, Steve Brown 11/14/2018 10:30 AM Medical Record [id number] Patient Account Number: [id number] Date of Birth/Sex: Treating RN: 1940/04/23 (78 y.o. Janyth Contes Primary Care Ottis Vacha: Lona Kettle Other Clinician: Referring Cambelle Suchecki: Treating Akiel Fennell/Extender:Robson, Archie Patten, CHARLES Weeks in Treatment: 0 Foot Assessment Items Site Locations + = Sensation present, - = Sensation absent, C = Callus, U = Ulcer R = Redness, W = Warmth, M = Maceration, PU = Pre-ulcerative lesion F = Fissure, S = Swelling, D = Dryness Assessment Right: Left: Other Deformity: No No Prior Foot Ulcer: No No Prior Amputation: No No Charcot Joint: No No Ambulatory Status: Ambulatory Without Help Gait:  Steady Electronic Signature(s) Signed: 11/18/2018 5:46:53 PM By: Levan Hurst RN, BSN Entered By: Levan Hurst on 11/14/2018 11:12:15 -------------------------------------------------------------------------------- Nutrition Risk Screening Details Patient Name: Date of Service: Steve Brown, Steve Brown 11/14/2018 10:30 AM Medical Record [id number] Patient Account Number: [id number] Date of Birth/Sex: Treating RN: Mar 16, 1940 (78 y.o. Janyth Contes Primary Care Brick Ketcher: Lona Kettle Other Clinician: Referring Braya Habermehl: Treating Shammond Arave/Extender:Robson, Archie Patten, CHARLES Weeks in Treatment: 0 Height (in): 65 Weight (lbs): 200 Body Mass Index (BMI): 33.3 Nutrition Risk Screening Items Score Screening NUTRITION RISK SCREEN: I have an illness or condition that made me change the kind and/or 2 Yes amount of food I eat I eat fewer than two meals per day 0 No I eat few fruits and vegetables, or milk products 0 No I have three or more drinks of beer, liquor or wine almost every day 0 No I have tooth or mouth problems that make it hard for me to eat 0 No I don't always have enough money to buy the food I need 0 No I eat alone most of the time 0 No I take three or more different prescribed or over-the-counter drugs a day 1 Yes 0 No Without wanting to, I have lost or gained 10 pounds in the last six months I am not always physically able to shop, cook and/or feed myself 0 No Nutrition Protocols Good Risk Protocol Provide education on Moderate Risk Protocol 0 nutrition High Risk Proctocol Risk Level: Moderate Risk Score: 3 Electronic Signature(s) Signed: 11/18/2018 5:46:53 PM By: Levan Hurst RN, BSN Entered By: Levan Hurst on 11/14/2018 11:11:00

## 2018-11-25 ENCOUNTER — Encounter: Payer: Self-pay | Admitting: Neurology

## 2018-11-26 DIAGNOSIS — L989 Disorder of the skin and subcutaneous tissue, unspecified: Secondary | ICD-10-CM | POA: Diagnosis not present

## 2018-11-26 DIAGNOSIS — D6859 Other primary thrombophilia: Secondary | ICD-10-CM | POA: Diagnosis not present

## 2018-11-28 ENCOUNTER — Other Ambulatory Visit: Payer: Self-pay

## 2018-11-28 ENCOUNTER — Ambulatory Visit (INDEPENDENT_AMBULATORY_CARE_PROVIDER_SITE_OTHER): Payer: Medicare Other | Admitting: Adult Health

## 2018-11-28 ENCOUNTER — Encounter: Payer: Self-pay | Admitting: Adult Health

## 2018-11-28 ENCOUNTER — Telehealth: Payer: Self-pay | Admitting: Adult Health

## 2018-11-28 VITALS — BP 106/64 | HR 64 | Temp 98.0°F | Ht 65.0 in | Wt 203.0 lb

## 2018-11-28 DIAGNOSIS — G4733 Obstructive sleep apnea (adult) (pediatric): Secondary | ICD-10-CM | POA: Diagnosis not present

## 2018-11-28 DIAGNOSIS — I25118 Atherosclerotic heart disease of native coronary artery with other forms of angina pectoris: Secondary | ICD-10-CM

## 2018-11-28 DIAGNOSIS — Z9989 Dependence on other enabling machines and devices: Secondary | ICD-10-CM | POA: Diagnosis not present

## 2018-11-28 NOTE — Progress Notes (Signed)
PATIENT: Steve Brown DOB: 20-Sep-1940  REASON FOR VISIT: follow up HISTORY FROM: patient  HISTORY OF PRESENT ILLNESS: Today 11/28/18:  [name] is a 78 year old male with a history of obstructive sleep apnea on CPAP.  He returns today for follow-up.  His download indicates that he uses machine nightly for compliance of 100%.  He uses machine greater than 4 hours 29 days for compliance of 97%.  On average he uses his machine 5 hours and 50 minutes.  His residual AHI is 1.1 on 13 cm of water with EPR of 1.  His leak in the 95th percentile is 39.2 L/min.  He reports that the CPAP is working well.  He has noticed the benefit.  On occasion he may feel the mask leaking but this is not the norm.  He returns today for an evaluation.  HISTORY 07-16-2018, [name] was last seen in a virtual visit phone call more or less by Debbora Presto nurse practitioner on 06 Jun 2018.  She followed up for OSA and CPAP only.  In the meantime on June 13, 2018 Dr. Harrington Challenger initiated a angiography study for the patient who had complained about vertigo and dizziness and has known atherosclerotic disease of the carotid arteries coronary arteries and is also a renal insufficiency patient.  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  Recurrent TIAs and syncopes were also to be evaluated as well as a history of pulmonary emboli.  The patient presented to Dr. Talbert Forest more for angiography.  The angiogram angiography was performed at: Hospital.  Patient presented with a blood pressure of 554/90 7 mmHg, respiration was 16 a minute, BMI 33.2, review of systems was normal or nonenhanced.  The result report is very long the impression is that of retrograde opacification of the nondominant left vertebrobasilar junction from the right vertebral artery to the level of the left posterior inferior cerebellar artery.  This is an indication of a proximal  occlusion of the left vertebral artery.  The right middle cerebral artery on the right anterior cerebral artery showed normal drainage.  The right internal carotid artery had normal flow, the dominant right vertebral artery origin was widely patent.  Please note that the patient had a known vertebrobasilar insufficiency is a left cerebellar infarct which is fully explained by the angiographic findings.  REVIEW OF SYSTEMS: Out of a complete 14 system review of symptoms, the patient complains only of the following symptoms, and all other reviewed systems are negative.  See HPI  ALLERGIES: Allergies  Allergen Reactions  . Other Other (See Comments)    Per patient- cardiac cath dye-  "woke up during procedure hysterical."  . Phenergan [Promethazine] Other (See Comments)    Mood changes     HOME MEDICATIONS: Outpatient Medications Prior to Visit  Medication Sig Dispense Refill  . acetaminophen (TYLENOL) 500 MG tablet Take 1,000 mg by mouth every 6 (six) hours as needed (pain).     Marland Kitchen albuterol (PROVENTIL HFA;VENTOLIN HFA) 108 (90 Base) MCG/ACT inhaler Inhale 2 puffs into the lungs every 6 (six) hours as needed for wheezing or shortness of breath.    . Ascorbic Acid (VITAMIN C PO) Take 1 tablet by mouth daily.    . bisacodyl (DULCOLAX) 5 MG EC tablet Take 5 mg by mouth daily as needed for moderate constipation.    . cholecalciferol (VITAMIN D) 1000 UNITS tablet Take  1,000 Units by mouth daily.    . diclofenac sodium (VOLTAREN) 1 % GEL Apply 1 application topically See admin instructions. Apply topically once daily , may use an additional time as needed for pain  1  . Echinacea 450 MG CAPS Take 450 mg by mouth daily.     . fluticasone (FLONASE) 50 MCG/ACT nasal spray Place 1 spray into both nostrils 2 (two) times daily as needed for allergies.   0  . furosemide (LASIX) 20 MG tablet Take 20 mg by mouth daily as needed (leg and ankle swelling).     . insulin lispro (HUMALOG KWIKPEN) 100 UNIT/ML  KiwkPen Inject 6-10 Units into the skin 3 (three) times daily with meals.    . IRON PO Take 1 tablet by mouth daily.    Marland Kitchen losartan (COZAAR) 100 MG tablet Take 100 mg by mouth daily.    . meclizine (ANTIVERT) 25 MG tablet Take 12.5-25 mg by mouth 3 (three) times daily as needed for dizziness.    . metFORMIN (GLUCOPHAGE) 500 MG tablet Take 500 mg by mouth 2 (two) times daily as needed (CBG >200).     . Multiple Vitamin (MULTIVITAMIN WITH MINERALS) TABS tablet Take 1 tablet by mouth daily.    . nitroGLYCERIN (NITROSTAT) 0.4 MG SL tablet Place 1 tablet (0.4 mg total) under the tongue every 5 (five) minutes as needed for chest pain. 25 tablet 12  . pantoprazole (PROTONIX) 40 MG tablet Take 40 mg by mouth daily as needed (acid reflux/heartburn).     . pregabalin (LYRICA) 300 MG capsule Take 1 capsule (300 mg total) by mouth 2 (two) times daily. 180 capsule 3  . rivaroxaban (XARELTO) 20 MG TABS tablet Take 20 mg by mouth daily.     . rosuvastatin (CRESTOR) 10 MG tablet Take 10 mg by mouth at bedtime.     . tamsulosin (FLOMAX) 0.4 MG CAPS capsule Take 0.4 mg by mouth daily.    Tyler Aas FLEXTOUCH 100 UNIT/ML SOPN FlexTouch Pen Inject 20 Units into the skin every morning.    . valACYclovir (VALTREX) 500 MG tablet Take 500 mg by mouth daily.     No facility-administered medications prior to visit.     PAST MEDICAL HISTORY: Past Medical History:  Diagnosis Date  . Abnormal prostate biopsy   . Anticoagulant long-term use    currently xarelto  . BPH with elevated PSA   . CKD (chronic kidney disease), stage II   . Complication of anesthesia    limted neck rom limited use of left arm due to cva  . Coronary artery disease    CARDIOLOGIST-  DR Irish Lack--  2010-- PCI w/ stenting midLAD  . DDD (degenerative disc disease), lumbar   . Degeneration of cervical intervertebral disc   . Depression   . Dyspnea on exertion   . GERD (gastroesophageal reflux disease)   . Hemiparesis due to cerebral infarction    . History of cerebrovascular accident (CVA) with residual deficit 2002 and 2003--  hemiparisis both sides   per MRI  anterior left frontal lobe, left para midline pons, and inferior cerebullam bilaterally infarcts  . History of pulmonary embolus (PE)    06-30-2012  extensive bilaterally  . History of recurrent TIAs   . History of syncope    hx multiple pre-syncope and syncopal episodes due to vasovagal, orthostatic hypotension, dehydration  . History of TIAs    several since 2002  . Hyperlipidemia   . Hypertension   . Mild atherosclerosis of  carotid artery, bilateral    per last duplex 11-04-2014  bilateral ICA 1--39%  . Neuropathy    fingers  . OSA on CPAP    followed by dr dohmeier--  sev. osa w/ AHI 65.9  . Prostate cancer ([place]) dx 2018  . Renal insufficiency   . S/P coronary artery stent placement 2010   stenting to mid LAD  . Simple renal cyst    bilaterally  . Stroke ([place])   . Trigger finger of both hands 11-17-13  . Type 2 diabetes mellitus ([place]) dx 1986   last one A1c 9.2 on 04-26-2016  . Unsteady gait    . Hx prior CVA/TIAs;  . Vertebral artery occlusion, left    chronic    PAST SURGICAL HISTORY: Past Surgical History:  Procedure Laterality Date  . ANTERIOR CERVICAL DECOMP/DISCECTOMY FUSION  2004   C3 -- C6 limited rom  . CARDIAC CATHETERIZATION  06-10-2010   dr Irish Lack   wide patent LAD stent, mid lesion at the origin of the septal prior to the previous stent 40-50%/  normal LVF, ef 55%  . CARDIOVASCULAR STRESS TEST  10-23-2012  dr Irish Lack   normal nuclear perfusion study w/ no ischemia/  normal LV function and wall motion , ef 65%  . CARPAL TUNNEL RELEASE Bilateral   . CATARACT EXTRACTION W/ INTRAOCULAR LENS  IMPLANT, BILATERAL    . CHOLECYSTECTOMY N/A 11/02/2015   Procedure: LAPAROSCOPIC CHOLECYSTECTOMY WITH INTRAOPERATIVE CHOLANGIOGRAM;  Surgeon: Donnie Mesa, MD;  Location: [facility];  Service: General;  Laterality: N/A;  . COLONOSCOPY    . CORONARY  ANGIOPLASTY WITH STENT PLACEMENT  02/2008   stenting to mid LAD  . GOLD SEED IMPLANT N/A 11/15/2016   Procedure: GOLD SEED IMPLANT TIMES THREE;  Surgeon: Ardis Hughs, MD;  Location: [facility];  Service: Urology;  Laterality: N/A;  . IR ANGIO INTRA EXTRACRAN SEL COM CAROTID INNOMINATE BILAT MOD SED  06/13/2018  . IR ANGIO VERTEBRAL SEL VERTEBRAL UNI R MOD SED  06/13/2018  . IR US GUIDE VASC ACCESS RIGHT  06/13/2018  . LEFT HEART CATH AND CORONARY ANGIOGRAPHY N/A 05/25/2017   Procedure: LEFT HEART CATH AND CORONARY ANGIOGRAPHY;  Surgeon: Jettie Booze, MD;  Location: [facility];  Service: Cardiovascular;  Laterality: N/A;  . LEFT HEART CATHETERIZATION WITH CORONARY ANGIOGRAM N/A 04/03/2013   Procedure: LEFT HEART CATHETERIZATION WITH CORONARY ANGIOGRAM;  Surgeon: Jettie Booze, MD;  Location: [facility] C[facility];  Service: Cardiovascular;  Laterality: N/A;  patent mLAD stent  w/ mild disease in remainder LAD and its branches;  mod. focal lesion midLCFx- FFR of lesion was negative for ischemia/  normal LVSF, ef 50%  . lungs  2005   "fluid pumped off lungs"  . NEUROPLASTY / TRANSPOSITION ULNAR NERVE AT ELBOW Right 2004  . PROSTATE BIOPSY N/A 08/31/2016   Procedure: PROSTATE  BIOPSY TRANSRECTAL ULTRASONIC PROSTATE (TUBP);  Surgeon: Ardis Hughs, MD;  Location: [facility];  Service: Urology;  Laterality: N/A;  . SPACE OAR INSTILLATION N/A 11/15/2016   Procedure: SPACE OAR INSTILLATION;  Surgeon: Ardis Hughs, MD;  Location: [facility];  Service: Urology;  Laterality: N/A;  . TRANSTHORACIC ECHOCARDIOGRAM  04/27/2016   severe focal basal LVH, ef 60-65%,  grade 2 diastoilc dysfunction/  mild AR, MR, and TR/  atrial septum lipomatous hypertrophy/  PASP 18mmHg  . UMBILICAL HERNIA REPAIR      FAMILY HISTORY: Family History  Problem Relation Age of Onset  .  Aneurysm Mother   . Cancer Father        unknown either pancreatic or  prostate  . Stroke Brother   . Dementia Sister   . Heart attack Neg Hx     SOCIAL HISTORY: Social History   Socioeconomic History  . Marital status: Divorced    Spouse name: Not on file  . Number of children: 4  . Years of education: 71  . Highest education level: Not on file  Occupational History    Employer: RETIRED    Comment: retired  Scientific laboratory technician  . Financial resource strain: Not on file  . Food insecurity    Worry: Not on file    Inability: Not on file  . Transportation needs    Medical: Not on file    Non-medical: Not on file  Tobacco Use  . Smoking status: Never Smoker  . Smokeless tobacco: Never Used  Substance and Sexual Activity  . Alcohol use: No    Alcohol/week: 0.0 standard drinks  . Drug use: No  . Sexual activity: Yes  Lifestyle  . Physical activity    Days per week: Not on file    Minutes per session: Not on file  . Stress: Not on file  Relationships  . Social Herbalist on phone: Not on file    Gets together: Not on file    Attends religious service: Not on file    Active member of club or organization: Not on file    Attends meetings of clubs or organizations: Not on file    Relationship status: Not on file  . Intimate partner violence    Fear of current or ex partner: Not on file    Emotionally abused: Not on file    Physically abused: Not on file    Forced sexual activity: Not on file  Other Topics Concern  . Not on file  Social History Narrative   Patient lives at home alone and he is single.  Patient is retired.    Caffeine - one cups daily.   Right handed.   Patient has a high school education.   Patient has four adult children.      PHYSICAL EXAM  Vitals:   11/28/18 1114  BP: 106/64  Pulse: 64  Temp: 98 F (36.7 C)  Weight: 203 lb (92.1 kg)  Height: 5\' 5"  (1.651 m)   Body mass index is 33.78 kg/m.  Generalized: Well developed, in no acute distress  Chest: Lungs clear to auscultation bilaterally   Neurological examination  Mentation: Alert oriented to time, place, history taking. Follows all commands speech and language fluent Cranial nerve II-XII: Extraocular movements were full, visual field were full on confrontational test Head turning and shoulder shrug  were normal and symmetric. Motor: The motor testing reveals 5 over 5 strength of all 4 extremities. Good symmetric motor tone is noted throughout.  Sensory: Sensory testing is intact to soft touch on all 4 extremities. No evidence of extinction is noted.  Gait and station: Gait is normal.    DIAGNOSTIC DATA (LABS, IMAGING, TESTING) - I reviewed patient records, labs, notes, testing and imaging myself where available.  Lab Results  Component Value Date   WBC 4.6 06/17/2018   HGB 13.2 06/17/2018   HCT 38.6 (L) 06/17/2018   MCV 93.2 06/17/2018   PLT 178 06/17/2018      Component Value Date/Time   NA 136 06/17/2018 1723   NA 142 05/17/2017 1255  NA 140 08/14/2012 1513   K 4.7 06/17/2018 1723   K 4.3 08/14/2012 1513   CL 103 06/17/2018 1723   CO2 21 (L) 06/17/2018 1723   CO2 27 08/14/2012 1513   GLUCOSE 189 (H) 06/17/2018 1723   GLUCOSE 262 (H) 08/14/2012 1513   BUN 24 (H) 06/17/2018 1723   BUN 30 (H) 05/17/2017 1255   BUN 19.0 08/14/2012 1513   CREATININE 1.26 (H) 06/17/2018 1723   CREATININE 1.29 03/19/2014 1130   CREATININE 1.3 08/14/2012 1513   CALCIUM 9.1 06/17/2018 1723   CALCIUM 9.3 08/14/2012 1513   PROT 6.4 (L) 05/18/2016 1455   PROT 6.9 08/14/2012 1513   ALBUMIN 3.7 05/18/2016 1455   ALBUMIN 3.4 (L) 08/14/2012 1513   AST 26 05/18/2016 1455   AST 15 08/14/2012 1513   ALT 22 05/18/2016 1455   ALT 14 08/14/2012 1513   ALKPHOS 72 05/18/2016 1455   ALKPHOS 92 08/14/2012 1513   BILITOT 0.5 05/18/2016 1455   BILITOT 0.39 08/14/2012 1513   GFRNONAA 55 (L) 06/17/2018 1723   GFRNONAA 67 05/19/2013 1108   GFRAA >60 06/17/2018 1723   GFRAA 78 05/19/2013 1108   Lab Results  Component Value Date   CHOL  144 03/21/2013   HDL 57 03/21/2013   LDLCALC 63 03/21/2013   TRIG 122 03/21/2013   CHOLHDL 2.5 03/21/2013   Lab Results  Component Value Date   HGBA1C 9.2 (H) 04/26/2016   No results found for: VITAMINB12 Lab Results  Component Value Date   TSH 0.618 03/20/2013      ASSESSMENT AND PLAN 78 y.o. year old male  has a past medical history of Abnormal prostate biopsy, Anticoagulant long-term use, BPH with elevated PSA, CKD (chronic kidney disease), stage II, Complication of anesthesia, Coronary artery disease, DDD (degenerative disc disease), lumbar, Degeneration of cervical intervertebral disc, Depression, Dyspnea on exertion, GERD (gastroesophageal reflux disease), Hemiparesis due to cerebral infarction, History of cerebrovascular accident (CVA) with residual deficit (2002 and 2003--  hemiparisis both sides), History of pulmonary embolus (PE), History of recurrent TIAs, History of syncope, History of TIAs, Hyperlipidemia, Hypertension, Mild atherosclerosis of carotid artery, bilateral, Neuropathy, OSA on CPAP, Prostate cancer ([place]) (dx 2018), Renal insufficiency, S/P coronary artery stent placement (2010), Simple renal cyst, Stroke ([place]), Trigger finger of both hands (11-17-13), Type 2 diabetes mellitus ([place]) (dx 1986), Unsteady gait, and Vertebral artery occlusion, left. here with:  1. Obstructive sleep apnea on CPAP  The patient's CPAP download shows excellent compliance and good treatment of his apnea.  He is encouraged to continue using CPAP nightly and greater than 4 hours each night.  I advised the patient to make sure he changes out his supplies regularly and making sure his straps are adjusted so that his mask does not leak.  He is advised that if his symptoms worsen or he develops new symptoms he should let us know.  He will follow-up in 1 year or sooner if needed   I spent 15 minutes with the patient. 50% of this time was spent reviewing CPAP download   Ward Givens, MSN, NP-C  11/28/2018, 11:32 AM [facility] [address], [place], [place] [address] [phone]

## 2018-11-28 NOTE — Telephone Encounter (Signed)
Patient was seen today and was given a yearly f/u to schedule at check-out (Return in about 1 year (around 11/28/2019). Patient states he will call back to schedule this.

## 2018-11-28 NOTE — Patient Instructions (Signed)
Continue using CPAP nightly and greater than 4 hours each night °If your symptoms worsen or you develop new symptoms please let us know.  ° °

## 2018-11-29 DIAGNOSIS — H02403 Unspecified ptosis of bilateral eyelids: Secondary | ICD-10-CM | POA: Diagnosis not present

## 2018-12-02 DIAGNOSIS — E669 Obesity, unspecified: Secondary | ICD-10-CM | POA: Diagnosis not present

## 2018-12-02 DIAGNOSIS — E1165 Type 2 diabetes mellitus with hyperglycemia: Secondary | ICD-10-CM | POA: Diagnosis not present

## 2018-12-02 DIAGNOSIS — C61 Malignant neoplasm of prostate: Secondary | ICD-10-CM | POA: Diagnosis not present

## 2018-12-02 DIAGNOSIS — E7800 Pure hypercholesterolemia, unspecified: Secondary | ICD-10-CM | POA: Diagnosis not present

## 2018-12-02 DIAGNOSIS — G609 Hereditary and idiopathic neuropathy, unspecified: Secondary | ICD-10-CM | POA: Diagnosis not present

## 2018-12-02 DIAGNOSIS — I10 Essential (primary) hypertension: Secondary | ICD-10-CM | POA: Diagnosis not present

## 2018-12-02 DIAGNOSIS — I639 Cerebral infarction, unspecified: Secondary | ICD-10-CM | POA: Diagnosis not present

## 2018-12-12 DIAGNOSIS — M25512 Pain in left shoulder: Secondary | ICD-10-CM | POA: Diagnosis not present

## 2018-12-12 DIAGNOSIS — M25812 Other specified joint disorders, left shoulder: Secondary | ICD-10-CM | POA: Diagnosis not present

## 2018-12-16 ENCOUNTER — Emergency Department (HOSPITAL_BASED_OUTPATIENT_CLINIC_OR_DEPARTMENT_OTHER)
Admission: EM | Admit: 2018-12-16 | Discharge: 2018-12-16 | Disposition: A | Discharge status: Home / Self Care | Payer: Medicare Other | Attending: Emergency Medicine | Admitting: Emergency Medicine

## 2018-12-16 ENCOUNTER — Encounter (HOSPITAL_BASED_OUTPATIENT_CLINIC_OR_DEPARTMENT_OTHER): Payer: Self-pay

## 2018-12-16 ENCOUNTER — Other Ambulatory Visit: Payer: Self-pay

## 2018-12-16 ENCOUNTER — Emergency Department (HOSPITAL_BASED_OUTPATIENT_CLINIC_OR_DEPARTMENT_OTHER): Payer: Medicare Other

## 2018-12-16 DIAGNOSIS — Z79899 Other long term (current) drug therapy: Secondary | ICD-10-CM | POA: Diagnosis not present

## 2018-12-16 DIAGNOSIS — I2510 Atherosclerotic heart disease of native coronary artery without angina pectoris: Secondary | ICD-10-CM | POA: Insufficient documentation

## 2018-12-16 DIAGNOSIS — N3001 Acute cystitis with hematuria: Secondary | ICD-10-CM | POA: Diagnosis not present

## 2018-12-16 DIAGNOSIS — Z8673 Personal history of transient ischemic attack (TIA), and cerebral infarction without residual deficits: Secondary | ICD-10-CM | POA: Diagnosis not present

## 2018-12-16 DIAGNOSIS — Z7901 Long term (current) use of anticoagulants: Secondary | ICD-10-CM | POA: Insufficient documentation

## 2018-12-16 DIAGNOSIS — R202 Paresthesia of skin: Secondary | ICD-10-CM | POA: Insufficient documentation

## 2018-12-16 DIAGNOSIS — E1122 Type 2 diabetes mellitus with diabetic chronic kidney disease: Secondary | ICD-10-CM | POA: Insufficient documentation

## 2018-12-16 DIAGNOSIS — R0602 Shortness of breath: Secondary | ICD-10-CM | POA: Diagnosis not present

## 2018-12-16 DIAGNOSIS — R5383 Other fatigue: Secondary | ICD-10-CM | POA: Diagnosis present

## 2018-12-16 DIAGNOSIS — E1140 Type 2 diabetes mellitus with diabetic neuropathy, unspecified: Secondary | ICD-10-CM | POA: Insufficient documentation

## 2018-12-16 DIAGNOSIS — I129 Hypertensive chronic kidney disease with stage 1 through stage 4 chronic kidney disease, or unspecified chronic kidney disease: Secondary | ICD-10-CM | POA: Diagnosis not present

## 2018-12-16 DIAGNOSIS — Z8546 Personal history of malignant neoplasm of prostate: Secondary | ICD-10-CM | POA: Insufficient documentation

## 2018-12-16 DIAGNOSIS — R531 Weakness: Secondary | ICD-10-CM | POA: Diagnosis not present

## 2018-12-16 DIAGNOSIS — N182 Chronic kidney disease, stage 2 (mild): Secondary | ICD-10-CM | POA: Insufficient documentation

## 2018-12-16 DIAGNOSIS — Z20828 Contact with and (suspected) exposure to other viral communicable diseases: Secondary | ICD-10-CM | POA: Insufficient documentation

## 2018-12-16 LAB — CBC WITH DIFFERENTIAL/PLATELET
Abs Immature Granulocytes: 0.03 10*3/uL (ref 0.00–0.07)
Basophils Absolute: 0 10*3/uL (ref 0.0–0.1)
Basophils Relative: 0 %
Eosinophils Absolute: 0 10*3/uL (ref 0.0–0.5)
Eosinophils Relative: 0 %
HCT: 41.2 % (ref 39.0–52.0)
Hemoglobin: 13.7 g/dL (ref 13.0–17.0)
Immature Granulocytes: 0 %
Lymphocytes Relative: 29 %
Lymphs Abs: 2 10*3/uL (ref 0.7–4.0)
MCH: 32.5 pg (ref 26.0–34.0)
MCHC: 33.3 g/dL (ref 30.0–36.0)
MCV: 97.6 fL (ref 80.0–100.0)
Monocytes Absolute: 0.6 10*3/uL (ref 0.1–1.0)
Monocytes Relative: 9 %
Neutro Abs: 4.2 10*3/uL (ref 1.7–7.7)
Neutrophils Relative %: 62 %
Platelets: 217 10*3/uL (ref 150–400)
RBC: 4.22 MIL/uL (ref 4.22–5.81)
RDW: 12.6 % (ref 11.5–15.5)
WBC: 6.9 10*3/uL (ref 4.0–10.5)
nRBC: 0 % (ref 0.0–0.2)

## 2018-12-16 LAB — URINALYSIS, ROUTINE W REFLEX MICROSCOPIC
Bilirubin Urine: NEGATIVE
Glucose, UA: NEGATIVE mg/dL
Ketones, ur: NEGATIVE mg/dL
Nitrite: NEGATIVE
Protein, ur: NEGATIVE mg/dL
Specific Gravity, Urine: 1.02 (ref 1.005–1.030)
pH: 6 (ref 5.0–8.0)

## 2018-12-16 LAB — CK: Total CK: 211 U/L (ref 49–397)

## 2018-12-16 LAB — COMPREHENSIVE METABOLIC PANEL
ALT: 25 U/L (ref 0–44)
AST: 23 U/L (ref 15–41)
Albumin: 4 g/dL (ref 3.5–5.0)
Alkaline Phosphatase: 58 U/L (ref 38–126)
Anion gap: 8 (ref 5–15)
BUN: 26 mg/dL — ABNORMAL HIGH (ref 8–23)
CO2: 28 mmol/L (ref 22–32)
Calcium: 9.2 mg/dL (ref 8.9–10.3)
Chloride: 103 mmol/L (ref 98–111)
Creatinine, Ser: 1.38 mg/dL — ABNORMAL HIGH (ref 0.61–1.24)
GFR calc Af Amer: 57 mL/min — ABNORMAL LOW (ref >60)
GFR calc non Af Amer: 49 mL/min — ABNORMAL LOW (ref >60)
Glucose, Bld: 124 mg/dL — ABNORMAL HIGH (ref 70–99)
Potassium: 4.2 mmol/L (ref 3.5–5.1)
Sodium: 139 mmol/L (ref 135–145)
Total Bilirubin: 0.6 mg/dL (ref 0.3–1.2)
Total Protein: 7.1 g/dL (ref 6.5–8.1)

## 2018-12-16 LAB — URINALYSIS, MICROSCOPIC (REFLEX): WBC, UA: >50 WBC/hpf (ref 0–5)

## 2018-12-16 LAB — TROPONIN I (HIGH SENSITIVITY): Troponin I (High Sensitivity): 9 ng/L (ref <18)

## 2018-12-16 LAB — SARS CORONAVIRUS 2 AG (30 MIN TAT): SARS Coronavirus 2 Ag: NEGATIVE

## 2018-12-16 MED ORDER — CEPHALEXIN 500 MG PO CAPS: 500.0000 mg | ORAL_CAPSULE | Freq: Two times a day (BID) | ORAL | 0 refills | Status: AC

## 2018-12-16 MED ORDER — SODIUM CHLORIDE 0.9 % IV BOLUS
1000.0000 mL | Freq: Once | INTRAVENOUS | Status: AC
  Administered 2018-12-16: 1000 mL via INTRAVENOUS

## 2018-12-16 MED ORDER — IOHEXOL 350 MG/ML SOLN
100.0000 mL | Freq: Once | INTRAVENOUS | Status: AC | PRN
  Administered 2018-12-16: 100 mL via INTRAVENOUS

## 2018-12-16 MED ORDER — SODIUM CHLORIDE 0.9 % IV SOLN
1.0000 g | Freq: Once | INTRAVENOUS | Status: AC
  Administered 2018-12-16: 1 g via INTRAVENOUS
  Filled 2018-12-16: qty 10

## 2018-12-16 NOTE — Discharge Instructions (Signed)
Take the antibiotics as prescribed.  Follow-up with your primary care provider in the next 2 days for reevaluation.  Keep your appointment with cardiology.  If you develop any unilateral weakness, facial droop, sudden onset headache, worsening chest pain, shortness of breath, coughing up blood please seek reevaluation emergency department.

## 2018-12-16 NOTE — ED Notes (Signed)
Pt called for ride

## 2018-12-16 NOTE — ED Notes (Signed)
ED Provider at bedside. 

## 2018-12-16 NOTE — ED Triage Notes (Addendum)
[facility] report- pt with weakness, muscle aches x 3 weeks-denies SOB, dizziness-Pt c/o fatigue, body pain x 3 weeks-states he was neg covid the end of Oct-denies fever/cough-nasal congestion noted and pt agrees-NAD-steady gait

## 2018-12-16 NOTE — ED Provider Notes (Signed)
[facility] EMERGENCY DEPARTMENT Provider Note   CSN: [id number] Arrival date & time: 12/16/18  1154    History   Chief Complaint Chief Complaint  Patient presents with   Fatigue    HPI Steve Brown is a 78 y.o. male with past medical history significant for PE, on Xarelto, CKD, CAD, CHF sees cardiology Dr. Miachel Roux , PCI with stent in 2010, chronic dyspnea on exertion, CVA, Chronic right vertebral artery occlusion, hypertension, hyperlipidemia who presents for evaluation of fatigue muscle aches and pains as well as generalized weakness.  Symptoms started 3 weeks ago.  States he does have history of chronic pain which feels similar.  Tested Covid negative at the end of October.  Denies sudden onset thunderclap headache, fever, chills, cough, hemoptysis, abdominal pain, dysuria, hematuria, unilateral weakness, drooling, dysphagia, trismus.  Patient admits to chronic dyspnea on exertion, chronic chest pain x10 years.  States he has felt mildly short of breath with exertion however at baseline. Walks 2 miles daily without difficulty. No chest pain extending to left arm, left jaw.  Admits to chronic tingling in his bilateral upper and lower extremities which is been present since his prior neck surgery. Has not take anything for symptoms.  Denies additional aggravating or  relieving factors. Mild dysuria x 1 week.  Left heart cath 2019 with 40% stenosis marginal.  25% occlusion to RCA, LAD.  Mid LAD stent widely patent.  EF 55 to 65%  History obtained from patient and past medical records.  No interpreter is used.     HPI  Past Medical History:  Diagnosis Date   Abnormal prostate biopsy    Anticoagulant long-term use    currently xarelto   BPH with elevated PSA    CKD (chronic kidney disease), stage II    Complication of anesthesia    limted neck rom limited use of left arm due to cva   Coronary artery disease    CARDIOLOGIST-  DR Irish Lack--  2010-- PCI w/ stenting  midLAD   DDD (degenerative disc disease), lumbar    Degeneration of cervical intervertebral disc    Depression    Dyspnea on exertion    GERD (gastroesophageal reflux disease)    Hemiparesis due to cerebral infarction    History of cerebrovascular accident (CVA) with residual deficit 2002 and 2003--  hemiparisis both sides   per MRI  anterior left frontal lobe, left para midline pons, and inferior cerebullam bilaterally infarcts   History of pulmonary embolus (PE)    06-30-2012  extensive bilaterally   History of recurrent TIAs    History of syncope    hx multiple pre-syncope and syncopal episodes due to vasovagal, orthostatic hypotension, dehydration   History of TIAs    several since 2002   Hyperlipidemia    Hypertension    Mild atherosclerosis of carotid artery, bilateral    per last duplex 11-04-2014  bilateral ICA 1--39%   Neuropathy    fingers   OSA on CPAP    followed by dr dohmeier--  sev. osa w/ AHI 65.9   Prostate cancer ([place]) dx 2018   Renal insufficiency    S/P coronary artery stent placement 2010   stenting to mid LAD   Simple renal cyst    bilaterally   Stroke [facility])    Trigger finger of both hands 11-17-13   Type 2 diabetes mellitus ([place]) dx 1986   last one A1c 9.2 on 04-26-2016   Unsteady gait    . Hx prior  CVA/TIAs;   Vertebral artery occlusion, left    chronic    Patient Active Problem List   Diagnosis Date Noted   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 ([place]) 10/21/2018   Nocturnal hypoxemia 10/21/2018   Radiation therapy complication [id number]   Primary prostate cancer ([place]) 07/31/2017   Anemia 07/31/2017   Vertigo due to cerebrovascular disease 07/31/2017   Poor compliance with CPAP treatment 07/31/2017   Absolute anemia 05/16/2017   Avitaminosis D 05/16/2017   Benign essential HTN 05/16/2017   Benign prostatic hypertrophy without urinary obstruction 05/16/2017   Clinical depression 05/16/2017     CN (constipation) 05/16/2017   Current drug use 05/16/2017   Diabetic neuropathy ([place]) 05/16/2017   Genital herpes 05/16/2017   Infarction of lung due to iatrogenic pulmonary embolism ([place]) 05/16/2017   Peripheral neuralgia 05/16/2017   Arteriosclerosis of coronary artery 05/16/2017   Artery disease, cerebral 05/16/2017   Apnea, sleep 05/16/2017   Arthralgia of hip or thigh 05/16/2017   Malignant neoplasm of prostate ([place]) 09/28/2016   Vascular dementia in remission ([place]) 09/28/2016   Remote history of stroke 09/28/2016   Acute kidney injury ([place]) 05/18/2016   Dehydration 05/18/2016   Near syncope 05/18/2016   Orthostatic hypotension 05/18/2016   CKD (chronic kidney disease), stage II 04/26/2016   UTI (urinary tract infection) 04/26/2016   Encounter for counseling on use of CPAP 11/18/2015   Chronic cholecystitis with calculus 11/02/2015   Stroke, 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[place]) 04/22/2015   TIA (transient ischemic attack) 11/03/2014   OSA on CPAP 05/18/2014   White matter disease 08/14/2013   OSA (obstructive sleep apnea) 08/14/2013   Abnormal x-ray of temporomandibular joint 05/30/2013   Chronic infection of sinus 05/30/2013   Cough 05/30/2013   Fatigue 05/30/2013   Unsteady gait 05/19/2013   Combined fat and carbohydrate induced hyperlipemia 04/24/2013   Syncope 03/20/2013   Angina pectoris ([place]) 10/08/2012   Hypertension    Stroke ([place])    History of TIAs    Lumbago    Other and unspecified hyperlipidemia    Personal history of unspecified circulatory disease    Unspecified fall    Pain in joint, multiple sites    Degeneration of cervical intervertebral disc    Unspecified cardiovascular disease    History of pulmonary embolism: June 2014,  Takes Xarelto 07/01/2012    Class: History of   Hemiparesis ([place]) 07/18/2011   Diabetes mellitus type 2 with complications ([place]) 99991111   HTN (hypertension) 07/18/2011   CAD in  native artery 07/18/2011   History of recurrent TIAs 07/18/2011    Past Surgical History:  Procedure Laterality Date   ANTERIOR CERVICAL DECOMP/DISCECTOMY FUSION  2004   C3 -- C6 limited rom   CARDIAC CATHETERIZATION  06-10-2010   dr Irish Lack   wide patent LAD stent, mid lesion at the origin of the septal prior to the previous stent 40-50%/  normal LVF, ef 55%   CARDIOVASCULAR STRESS TEST  10-23-2012  dr Irish Lack   normal nuclear perfusion study w/ no ischemia/  normal LV function and wall motion , ef 65%   CARPAL TUNNEL RELEASE Bilateral    CATARACT EXTRACTION W/ INTRAOCULAR LENS  IMPLANT, BILATERAL     CHOLECYSTECTOMY N/A 11/02/2015   Procedure: LAPAROSCOPIC CHOLECYSTECTOMY WITH INTRAOPERATIVE CHOLANGIOGRAM;  Surgeon: Donnie Mesa, MD;  Location: [facility];  Service: General;  Laterality: N/A;   COLONOSCOPY     CORONARY ANGIOPLASTY WITH STENT PLACEMENT  02/2008   stenting to mid LAD   GOLD SEED IMPLANT  N/A 11/15/2016   Procedure: GOLD SEED IMPLANT TIMES THREE;  Surgeon: Ardis Hughs, MD;  Location: [facility];  Service: Urology;  Laterality: N/A;   IR ANGIO INTRA EXTRACRAN SEL COM CAROTID INNOMINATE BILAT MOD SED  06/13/2018   IR ANGIO VERTEBRAL SEL VERTEBRAL UNI R MOD SED  06/13/2018   IR US GUIDE VASC ACCESS RIGHT  06/13/2018   LEFT HEART CATH AND CORONARY ANGIOGRAPHY N/A 05/25/2017   Procedure: LEFT HEART CATH AND CORONARY ANGIOGRAPHY;  Surgeon: Jettie Booze, MD;  Location: [facility] CV LAB;  Service: Cardiovascular;  Laterality: N/A;   LEFT HEART CATHETERIZATION WITH CORONARY ANGIOGRAM N/A 04/03/2013   Procedure: LEFT HEART CATHETERIZATION WITH CORONARY ANGIOGRAM;  Surgeon: Jettie Booze, MD;  Location: [facility] CATH LAB;  Service: Cardiovascular;  Laterality: N/A;  patent mLAD stent  w/ mild disease in remainder LAD and its branches;  mod. focal lesion midLCFx- FFR of lesion was negative for ischemia/  normal LVSF, ef 50%   lungs  2005   "fluid  pumped off lungs"   NEUROPLASTY / TRANSPOSITION ULNAR NERVE AT ELBOW Right 2004   PROSTATE BIOPSY N/A 08/31/2016   Procedure: PROSTATE  BIOPSY TRANSRECTAL ULTRASONIC PROSTATE (TUBP);  Surgeon: Ardis Hughs, MD;  Location: [facility];  Service: Urology;  Laterality: N/A;   SPACE OAR INSTILLATION N/A 11/15/2016   Procedure: SPACE OAR INSTILLATION;  Surgeon: Ardis Hughs, MD;  Location: [facility];  Service: Urology;  Laterality: N/A;   TRANSTHORACIC ECHOCARDIOGRAM  04/27/2016   severe focal basal LVH, ef 60-65%,  grade 2 diastoilc dysfunction/  mild AR, MR, and TR/  atrial septum lipomatous hypertrophy/  PASP [id number]   UMBILICAL HERNIA REPAIR          Home Medications    Prior to Admission medications   Medication Sig Start Date End Date Taking? Authorizing Provider  acetaminophen (TYLENOL) 500 MG tablet Take 1,000 mg by mouth every 6 (six) hours as needed (pain).    Yes [provider]  Ascorbic Acid (VITAMIN C PO) Take 1 tablet by mouth daily.   Yes [provider]  cholecalciferol (VITAMIN D) 1000 UNITS tablet Take 1,000 Units by mouth daily.   Yes [provider]  diclofenac sodium (VOLTAREN) 1 % GEL Apply 1 application topically See admin instructions. Apply topically once daily , may use an additional time as needed for pain 10/16/16  Yes [provider]  Echinacea 450 MG CAPS Take 450 mg by mouth daily.    Yes [provider]  furosemide (LASIX) 20 MG tablet Take 20 mg by mouth daily as needed (leg and ankle swelling).    Yes [provider]  insulin lispro (HUMALOG KWIKPEN) 100 UNIT/ML KiwkPen Inject 6-10 Units into the skin 3 (three) times daily with meals.   Yes [provider]  IRON PO Take 1 tablet by mouth daily.   Yes [provider]  losartan (COZAAR) 100 MG tablet Take 100 mg by mouth daily. 03/29/18  Yes [provider]  Multiple Vitamin  (MULTIVITAMIN WITH MINERALS) TABS tablet Take 1 tablet by mouth daily.   Yes [provider]  pregabalin (LYRICA) 300 MG capsule Take 1 capsule (300 mg total) by mouth 2 (two) times daily. 07/26/12  Yes Dohmeier, Asencion Partridge, MD  rivaroxaban (XARELTO) 20 MG TABS tablet Take 20 mg by mouth daily.    Yes [provider]  rosuvastatin (CRESTOR) 10 MG tablet Take 10 mg by mouth at  bedtime.    Yes [provider]  tamsulosin (FLOMAX) 0.4 MG CAPS capsule Take 0.4 mg by mouth daily. 05/09/16  Yes [provider]  valACYclovir (VALTREX) 500 MG tablet Take 500 mg by mouth daily.   Yes [provider]  albuterol (PROVENTIL HFA;VENTOLIN HFA) 108 (90 Base) MCG/ACT inhaler Inhale 2 puffs into the lungs every 6 (six) hours as needed for wheezing or shortness of breath.    [provider]  bisacodyl (DULCOLAX) 5 MG EC tablet Take 5 mg by mouth daily as needed for moderate constipation.    [provider]  cephALEXin (KEFLEX) 500 MG capsule Take 1 capsule (500 mg total) by mouth 2 (two) times daily for 7 days. 12/16/18 12/23/18  Margherita Collyer A, PA-C  fluticasone (FLONASE) 50 MCG/ACT nasal spray Place 1 spray into both nostrils 2 (two) times daily as needed for allergies.  09/26/16   [provider]  meclizine (ANTIVERT) 25 MG tablet Take 12.5-25 mg by mouth 3 (three) times daily as needed for dizziness.    [provider]  nitroGLYCERIN (NITROSTAT) 0.4 MG SL tablet Place 1 tablet (0.4 mg total) under the tongue every 5 (five) minutes as needed for chest pain. 12/07/15   Jettie Booze, MD  pantoprazole (PROTONIX) 40 MG tablet Take 40 mg by mouth daily as needed (acid reflux/heartburn).     [provider]  TRESIBA FLEXTOUCH 100 UNIT/ML SOPN FlexTouch Pen Inject 20 Units into the skin every morning. 05/11/18   [provider]    Family History Family History  Problem Relation Age of Onset   Aneurysm Mother    Cancer  Father        unknown either pancreatic or prostate   Stroke Brother    Dementia Sister    Heart attack Neg Hx     Social History Social History   Tobacco Use   Smoking status: Never Smoker   Smokeless tobacco: Never Used  Substance Use Topics   Alcohol use: No    Alcohol/week: 0.0 standard drinks   Drug use: No     Allergies   Other and Phenergan [promethazine]   Review of Systems Review of Systems  Constitutional: Positive for fatigue (x3 weeks). Negative for activity change, appetite change and chills.  Respiratory: Positive for shortness of breath (Chronic on exhertion). Negative for cough, choking and chest tightness.   Cardiovascular: Positive for chest pain (Chronic x 10 years per patient). Negative for palpitations and leg swelling.  Gastrointestinal: Negative.   Genitourinary: Negative.   Musculoskeletal: Positive for myalgias (Chronic since surgery) and neck pain. Negative for arthralgias, back pain, gait problem, joint swelling and neck stiffness.  Skin: Negative.   Neurological: Positive for weakness (Generalized weakness, no focal weakness) and headaches. Negative for dizziness, tremors, syncope, speech difficulty, light-headedness and numbness.  All other systems reviewed and are negative.    Physical Exam Updated Vital Signs BP (!) 166/84    Pulse (!) 55    Temp 99.3 F (37.4 C) (Oral)    Resp 13    Ht 5\' 5"  (1.651 m)    Wt 89.8 kg    SpO2 97%    BMI 32.95 kg/m   Physical Exam Vitals signs and nursing note reviewed.  Constitutional:      General: He is not in acute distress.    Appearance: He is not ill-appearing, toxic-appearing or diaphoretic.  HENT:     Head: Normocephalic and atraumatic.     Jaw: There is  normal jaw occlusion.     Nose: Nose normal.  Eyes:     Extraocular Movements: Extraocular movements intact.     Comments: No horizontal, vertical or rotational nystagmus   Neck:     Musculoskeletal: Full passive range of motion  without pain, normal range of motion and neck supple.     Vascular: No carotid bruit or JVD.     Trachea: Trachea and phonation normal.     Meningeal: Brudzinski's sign and Kernig's sign absent.     Comments: Full active and passive ROM without pain No midline or paraspinal tenderness No nuchal rigidity or meningeal signs  Cardiovascular:     Rate and Rhythm: Normal rate.     Pulses: Normal pulses.          Radial pulses are 2+ on the right side and 2+ on the left side.       Posterior tibial pulses are 2+ on the right side and 2+ on the left side.     Heart sounds: Normal heart sounds.  Pulmonary:     Effort: Pulmonary effort is normal.     Breath sounds: Normal breath sounds and air entry.  Chest:     Chest wall: No deformity, swelling, tenderness, crepitus or edema.  Musculoskeletal:     Right lower leg: No edema.     Left lower leg: No edema.  Feet:     Right foot:     Skin integrity: Skin integrity normal.     Left foot:     Skin integrity: Skin integrity normal.  Skin:    General: Skin is warm.     Capillary Refill: Capillary refill takes less than 2 seconds.  Neurological:     General: No focal deficit present.     Mental Status: He is alert and oriented to person, place, and time.     Comments: Mental Status:  Alert, oriented, thought content appropriate. Speech fluent without evidence of aphasia. Able to follow 2 step commands without difficulty.  Cranial Nerves:  II:  Peripheral visual fields grossly normal, pupils equal, round, reactive to light III,IV, VI: ptosis not present, extra-ocular motions intact bilaterally  V,VII: smile symmetric, facial light touch sensation equal VIII: hearing grossly normal bilaterally  IX,X: midline uvula rise  XI: bilateral shoulder shrug equal and strong XII: midline tongue extension  Motor:  5/5 in upper and lower extremities bilaterally including strong and equal grip strength and dorsiflexion/plantar flexion Sensory: Pinprick  and light touch normal in all extremities.  Deep Tendon Reflexes: 2+ and symmetric  Cerebellar: normal finger-to-nose with bilateral upper extremities Gait: normal gait and balance CV: distal pulses palpable throughout     ED Treatments / Results  Labs (all labs ordered are listed, but only abnormal results are displayed) Labs Reviewed  COMPREHENSIVE METABOLIC PANEL - Abnormal; Notable for the following components:      Result Value   Glucose, Bld 124 (*)    BUN 26 (*)    Creatinine, Ser 1.38 (*)    GFR calc non Af Amer 49 (*)    GFR calc Af Amer 57 (*)    All other components within normal limits  URINALYSIS, ROUTINE W REFLEX MICROSCOPIC - Abnormal; Notable for the following components:   APPearance CLOUDY (*)    Hgb urine dipstick SMALL (*)    Leukocytes,Ua LARGE (*)    All other components within normal limits  URINALYSIS, MICROSCOPIC (REFLEX) - Abnormal; Notable for the following components:   Bacteria, UA  MANY (*)    All other components within normal limits  SARS CORONAVIRUS 2 AG (30 MIN TAT)  URINE CULTURE  CBC WITH DIFFERENTIAL/PLATELET  CK  TROPONIN I (HIGH SENSITIVITY)  TROPONIN I (HIGH SENSITIVITY)    EKG EKG Interpretation  Date/Time:  Monday December 16 2018 12:26:05 EST Ventricular Rate:  66 PR Interval:  146 QRS Duration: 78 QT Interval:  366 QTC Calculation: 383 R Axis:   9 Text Interpretation: Normal sinus rhythm Cannot rule out Anterior infarct , age undetermined Abnormal ECG Confirmed by Madalyn Rob [id number]) on 12/16/2018 2:16:01 PM   Radiology Ct Angio Head W Or Wo Contrast  Result Date: 12/16/2018 CLINICAL DATA:  Headache.  Neck pain history of prostate cancer. EXAM: CT ANGIOGRAPHY HEAD AND NECK TECHNIQUE: Multidetector CT imaging of the head and neck was performed using the standard protocol during bolus administration of intravenous contrast. Multiplanar CT image reconstructions and MIPs were obtained to evaluate the vascular anatomy.  Carotid stenosis measurements (when applicable) are obtained utilizing NASCET criteria, using the distal internal carotid diameter as the denominator. CONTRAST:  145mL OMNIPAQUE IOHEXOL 350 MG/ML SOLN COMPARISON:  CT a head and neck is 09/09/2017 FINDINGS: CT HEAD FINDINGS Brain: Mild atrophy, typical for age. Small white matter hypodensities bilaterally are stable. No acute infarct, hemorrhage, mass. No midline shift. Vascular: Carotid artery calcification bilaterally. Negative for hyperdense vessel Skull: Negative Sinuses: Hyperdense secretions in the right sphenoid and left ethmoid sinus similar to prior study. Orbits: Bilateral cataract surgery.  No orbital mass lesion. Review of the MIP images confirms the above findings CTA NECK FINDINGS Aortic arch: Atherosclerotic aortic arch without aneurysm or dissection. Bovine branching pattern. Mild stenosis origin of left subclavian artery. Right carotid system: Tortuous innominate. Right subclavian artery widely patent. Right common carotid artery widely patent. Atherosclerotic calcification in the right carotid bifurcation. Less than 50% diameter stenosis proximal right internal carotid artery unchanged from the prior study. No additional internal carotid artery stenosis. Left carotid system: Atherosclerotic calcification in the left common carotid artery with less than 50% diameter stenosis in the mid section. Atherosclerotic calcification left carotid bulb with less than 50% diameter stenosis left internal carotid artery. Vertebral arteries: Right vertebral artery is dominant and is widely patent to the basilar. Chronic occlusion left vertebral artery at the origin with reconstitution at the C2 level. Surrounding muscular collaterals suggests this is reconstitution from collaterals. Skeleton: Corpectomy and anterior fusion C3 through C6 unchanged. No acute skeletal abnormality. Other neck: Negative for mass or adenopathy. Upper chest: No acute abnormality in the  lung apices bilaterally. Review of the MIP images confirms the above findings CTA HEAD FINDINGS Anterior circulation: Atherosclerotic calcification in the cavernous carotid bilaterally causing mild stenosis. Anterior and middle cerebral arteries patent bilaterally without significant stenosis. Posterior circulation: Both vertebral arteries patent to the basilar. Right vertebral patent with mild atherosclerotic disease but no significant stenosis. PICA patent bilaterally. Basilar widely patent. Superior cerebellar and posterior cerebral arteries patent bilaterally without stenosis. Venous sinuses: Normal venous enhancement Anatomic variants: None Review of the MIP images confirms the above findings IMPRESSION: 1. Less than 50% diameter stenosis of the internal carotid artery bilaterally due to calcific stenosis. Less than 50% diameter stenosis due to calcific stenosis mid left common carotid artery. 2. Dominant right vertebral artery which is widely patent. Chronic occlusion left vertebral artery with reconstitution at the C2 level. 3. Mild atherosclerotic calcifications stenosis in the cavernous carotid bilaterally. No intracranial large vessel occlusion or flow limiting stenosis. 4. CT  head demonstrates no acute abnormality. Electronically Signed   By: Franchot Gallo M.D.   On: 12/16/2018 15:50   Ct Angio Neck W And/or Wo Contrast  Result Date: 12/16/2018 CLINICAL DATA:  Headache.  Neck pain history of prostate cancer. EXAM: CT ANGIOGRAPHY HEAD AND NECK TECHNIQUE: Multidetector CT imaging of the head and neck was performed using the standard protocol during bolus administration of intravenous contrast. Multiplanar CT image reconstructions and MIPs were obtained to evaluate the vascular anatomy. Carotid stenosis measurements (when applicable) are obtained utilizing NASCET criteria, using the distal internal carotid diameter as the denominator. CONTRAST:  166mL OMNIPAQUE IOHEXOL 350 MG/ML SOLN COMPARISON:  CT a  head and neck is 09/09/2017 FINDINGS: CT HEAD FINDINGS Brain: Mild atrophy, typical for age. Small white matter hypodensities bilaterally are stable. No acute infarct, hemorrhage, mass. No midline shift. Vascular: Carotid artery calcification bilaterally. Negative for hyperdense vessel Skull: Negative Sinuses: Hyperdense secretions in the right sphenoid and left ethmoid sinus similar to prior study. Orbits: Bilateral cataract surgery.  No orbital mass lesion. Review of the MIP images confirms the above findings CTA NECK FINDINGS Aortic arch: Atherosclerotic aortic arch without aneurysm or dissection. Bovine branching pattern. Mild stenosis origin of left subclavian artery. Right carotid system: Tortuous innominate. Right subclavian artery widely patent. Right common carotid artery widely patent. Atherosclerotic calcification in the right carotid bifurcation. Less than 50% diameter stenosis proximal right internal carotid artery unchanged from the prior study. No additional internal carotid artery stenosis. Left carotid system: Atherosclerotic calcification in the left common carotid artery with less than 50% diameter stenosis in the mid section. Atherosclerotic calcification left carotid bulb with less than 50% diameter stenosis left internal carotid artery. Vertebral arteries: Right vertebral artery is dominant and is widely patent to the basilar. Chronic occlusion left vertebral artery at the origin with reconstitution at the C2 level. Surrounding muscular collaterals suggests this is reconstitution from collaterals. Skeleton: Corpectomy and anterior fusion C3 through C6 unchanged. No acute skeletal abnormality. Other neck: Negative for mass or adenopathy. Upper chest: No acute abnormality in the lung apices bilaterally. Review of the MIP images confirms the above findings CTA HEAD FINDINGS Anterior circulation: Atherosclerotic calcification in the cavernous carotid bilaterally causing mild stenosis. Anterior and  middle cerebral arteries patent bilaterally without significant stenosis. Posterior circulation: Both vertebral arteries patent to the basilar. Right vertebral patent with mild atherosclerotic disease but no significant stenosis. PICA patent bilaterally. Basilar widely patent. Superior cerebellar and posterior cerebral arteries patent bilaterally without stenosis. Venous sinuses: Normal venous enhancement Anatomic variants: None Review of the MIP images confirms the above findings IMPRESSION: 1. Less than 50% diameter stenosis of the internal carotid artery bilaterally due to calcific stenosis. Less than 50% diameter stenosis due to calcific stenosis mid left common carotid artery. 2. Dominant right vertebral artery which is widely patent. Chronic occlusion left vertebral artery with reconstitution at the C2 level. 3. Mild atherosclerotic calcifications stenosis in the cavernous carotid bilaterally. No intracranial large vessel occlusion or flow limiting stenosis. 4. CT head demonstrates no acute abnormality. Electronically Signed   By: Franchot Gallo M.D.   On: 12/16/2018 15:50   Dg Chest Portable 1 View  Result Date: 12/16/2018 CLINICAL DATA:  Cough, shortness of breath. Additional history provided: Weakness, muscle aches for 3 weeks. EXAM: PORTABLE CHEST 1 VIEW COMPARISON:  Chest radiograph 06/17/2018 FINDINGS: Stable, mild cardiomegaly.  Aortic atherosclerosis. No evidence of airspace consolidation within the lungs. No evidence of pneumothorax or sizable pleural effusion. No acute bony  abnormality. Partially visualized cervical spinal fusion hardware. IMPRESSION: No evidence of acute cardiopulmonary abnormality. Stable, mild cardiomegaly. Aortic atherosclerosis. Electronically Signed   By: Kellie Simmering DO   On: 12/16/2018 14:42    Procedures Procedures (including critical care time)  Medications Ordered in ED Medications  sodium chloride 0.9 % bolus 1,000 mL (0 mLs Intravenous Stopped 12/16/18 1607)    iohexol (OMNIPAQUE) 350 MG/ML injection 100 mL (100 mLs Intravenous Contrast Given 12/16/18 1522)  cefTRIAXone (ROCEPHIN) 1 g in sodium chloride 0.9 % 100 mL IVPB (0 g Intravenous Stopped 12/16/18 1645)   Initial Impression / Assessment and Plan / ED Course  I have reviewed the triage vital signs and the nursing notes.  Pertinent labs & imaging results that were available during my care of the patient were reviewed by me and considered in my medical decision making (see chart for details).  78 year old patient with extensive medical history presents for fatigue and body aches and pains x3 weeks. Hx chronic body pains which states feels similar. He is afebrile, nonseptic, non-ill-appearing.  He does have multiple complaints however these appear chronic in nature.  Patient states he has had chronic chest pain times 10 years, shortness of breath with exertion as well as chronic neck pain. Walks 2 mild daily without difficulty. He does have prior history of CVA, CAD and has been stented recently in 2010.  He did have a cath in 2019 which showed stenosis 40% of marginal however 25% stenosis of RCA, LAD his prior stent was widely patent.  Patient states his chest pain shortness of breath has not changed.  Patient states she does have a headache however states she also has chronic headaches.  Denies any dizziness or any focal neurologic deficits.  He has a nonfocal neurologic exam without deficits.  Patient appears overall well.  Was originally tested negative for Covid at the beginning of 3 weeks which was negative.  Has been compliant with his medications, specifically his Xarelto.  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 No DVT on exam and he does not any tachycardia, tachypnea or hypoxia.   Labs and imaging personally reviewed: CBC without leukocytosis  Metabolic panel with creatinine 1.38, previous 1.26, 1.6 CK 211 Troponin negative Urinalysis grossly positive for UTI, blood no flank or abd pain, low  suspicion for infected stone. Pain film chest with stable cardiomegaly, no infiltrates, pulmonary edema, pneumothorax. No fluid overload on exam. EKG ST/T changes. CTA head and neck with stable carotid artery stenosis, less than 50%, similar to prior CT imaging.  No gross CVA. Rapid COVID negative, offered send out Covid test which patient declined.  1715: Patient reevaluated.  States he feels much improved with IV fluids.  Patient continues to reassure me that his chest pain and shortness of breath are at his baseline that he has had over the last 10 years.  Troponin here in the ED is negative.  EKG without ST/T changes.  This is reassuring.  Low suspicion for acute ACS, PE, dissection, [place], or myocarditis.  Does have history of PE however he is compliant with his anticoagulation, has no tachycardia, tachypnea or hypoxia.  His blood pressure stable.  He does have follow-up with cardiology this week.  He continues to have a nonfocal neurologic exam without deficits.  Shared decision making with patient for admission for UTI with generalized weakness versus outpatient therapy.  Patient states he would like trial of outpatient therapy which I feel is reasonable.  He is tolerating  p.o. intake in ED without difficulty.  His abdomen remains soft, nontender without focal pain.  Nonsurgical abdomen.  Patient with normal neurologic exam.  No vertical or rotational nystagmus. Patient normal finger-nose and normal gait.  No slurred speech renal or weakness.  Doubt CVA or other central cause of vertigo.  VSS, no tracheal deviation, no JVD or new murmur, RRR, breath sounds equal bilaterally, EKG without acute abnormalities, negative troponin, and negative CXR.    The patient has been appropriately medically screened and/or stabilized in the ED. I have low suspicion for any other emergent medical condition which would require further screening, evaluation or treatment in the ED or require inpatient  management.  Patient is hemodynamically stable and in no acute distress.  Patient able to ambulate in department prior to ED.  Evaluation does not show acute pathology that would require ongoing or additional emergent interventions while in the emergency department or further inpatient treatment.  I have discussed the diagnosis with the patient and answered all questions.  Pain is been managed while in the emergency department and patient has no further complaints prior to discharge.  Patient is comfortable with plan discussed in room and is stable for discharge at this time.  I have discussed strict return precautions for returning to the emergency department.  Patient was encouraged to follow-up with PCP/specialist refer to at discharge.     Discussed patient with attending provider, Dr. Melina Copa who agrees with above treatment, plan disposition.    Final Clinical Impressions(s) / ED Diagnoses   Final diagnoses:  Acute cystitis with hematuria  Weakness    ED Discharge Orders         Ordered    cephALEXin (KEFLEX) 500 MG capsule  2 times daily     12/16/18 1722           Tinsley Everman A, PA-C 12/16/18 1729    Hayden Rasmussen, MD 12/17/18 1143

## 2018-12-17 NOTE — Progress Notes (Signed)
IRIS, MCQUILLAN ([id number]) Visit Report for 09/25/2018 Chief Complaint Document Details Patient Name: Date of Service: Steve Brown, Steve Brown 09/25/2018 1:15 PM Medical Record [id number] Patient Account Number: [id number] Date of Birth/Sex: Treating RN: April 19, 1940 (78 y.o. Ernestene Mention Primary Care Provider: Lona Kettle Other Clinician: Sandre Kitty Referring Provider: Treating Provider/Extender:Stone III, Selena Lesser, CHARLES Weeks in Treatment: 0 Information Obtained from: Patient Chief Complaint Patient arrived for evaluation of his right lower extremity Electronic Signature(s) Signed: 09/25/2018 3:12:15 PM By: Worthy Keeler PA-C Entered By: Worthy Keeler on 09/25/2018 15:12:14 -------------------------------------------------------------------------------- HPI Details Patient Name: Date of Service: Steve Brown, Steve Brown 09/25/2018 1:15 PM Medical Record [id number] Patient Account Number: [id number] Date of Birth/Sex: Treating RN: Feb 06, 1940 (78 y.o. Ernestene Mention Primary Care Provider: Lona Kettle Other Clinician: Sandre Kitty Referring Provider: Treating Provider/Extender:Stone III, Selena Lesser, CHARLES Weeks in Treatment: 0 History of Present Illness HPI Description: 2/28/118- the patient arrives for evaluation of his right lower extremity. He had a wound to the anterior aspect of his right lower extremity, originally a traumatic injury. He has been treating this with over-the- counter triple antibiotic ointment and leaving open to air. He is a diabetic with his most recent A1c is 7.8, which is a trend down from 9. He is controlled with oral agents and insulin. He denies any residual pain to his leg. He wanted to be seen to confirm that no additional testing or intervention needed to be performed. His wound is completely epithelialized. Readmission: 09/25/2018 on evaluation today patient presents for reevaluation in our clinic although he has not  been seen actually since February 2018. Today he is seen due to what sounds to be maceration breakdown between his toes that he noted. With that being said he was advised by the pharmacist to use new skin to try to help clear this up. He has been putting out between his toes and to be partly honest it seems like it is done well for him in fact he seems to be excellent upon evaluation today. There is no signs of any infection in fact there is no signs of anything even open at this time. Electronic Signature(s) Signed: 09/25/2018 7:09:41 PM By: Worthy Keeler PA-C Entered By: Worthy Keeler on 09/25/2018 19:09:41 -------------------------------------------------------------------------------- Physical Exam Details Patient Name: Date of Service: Steve Brown, Steve Brown 09/25/2018 1:15 PM Medical Record [id number] Patient Account Number: [id number] Date of Birth/Sex: Treating RN: [birth year] (78 y.o. Ernestene Mention Primary Care Provider: Lona Kettle Other Clinician: Sandre Kitty Referring Provider: Treating Provider/Extender:Stone III, Selena Lesser, CHARLES Weeks in Treatment: 0 Constitutional sitting or standing blood pressure is within target range for patient.. pulse regular and within target range for patient.Marland Kitchen respirations regular, non-labored and within target range for patient.Marland Kitchen temperature within target range for patient.. Well-nourished and well-hydrated in no acute distress. Eyes conjunctiva clear no eyelid edema noted. pupils equal round and reactive to light and accommodation. Ears, Nose, Mouth, and Throat no gross abnormality of ear auricles or external auditory canals. normal hearing noted during conversation. mucus membranes moist. Respiratory normal breathing without difficulty. clear to auscultation bilaterally. Cardiovascular regular rate and rhythm with normal S1, S2. 2+ dorsalis pedis/posterior tibialis pulses. no clubbing, cyanosis, significant edema, <3 sec  cap refill. Gastrointestinal (GI) soft, non-tender, non-distended, +BS. no ventral hernia noted. Musculoskeletal normal gait and posture. no significant deformity or arthritic changes, no loss or range of motion, no clubbing. Psychiatric this patient is able to make decisions and demonstrates good insight into disease process. Alert and Oriented x  3. pleasant and cooperative. Notes Upon evaluation today patient's wound actually showed signs of excellent epithelialization at this time. There does not appear to be any evidence of infection which is good news there is also no signs really of any skin breakdown which is also excellent news. With that being said I really feel like that what he is done with the new skin over the area has protect the region and allow this to heal although I see some evidence for maceration I will discuss options for him to help manage this in the future. Electronic Signature(s) Signed: 09/25/2018 7:10:26 PM By: Worthy Keeler PA-C Entered By: Worthy Keeler on 09/25/2018 19:10:26 -------------------------------------------------------------------------------- Physician Orders Details Patient Name: Date of Service: Steve Brown, Steve Brown 09/25/2018 1:15 PM Medical Record [id number] Patient Account Number: [id number] Date of Birth/Sex: Treating RN: May 03, 1940 (78 y.o. Ernestene Mention Primary Care Provider: Lona Kettle Other Clinician: Sandre Kitty Referring Provider: Treating Provider/Extender:Stone III, Selena Lesser, CHARLES Weeks in Treatment: 0 Verbal / Phone Orders: No Diagnosis Coding ICD-10 Coding Code Description E11.9 Type 2 diabetes mellitus without complications Discharge From [facility] Services Discharge from [place] Other: - light application of foot powder to toes daily Additional Orders / Instructions Other: - inspect feet daily Electronic Signature(s) Signed: 09/25/2018 7:10:00 PM By: Baruch Gouty RN, BSN Signed: 09/25/2018 7:17:52 PM By: Worthy Keeler PA-C Entered By: Baruch Gouty on 09/25/2018 14:52:17 -------------------------------------------------------------------------------- Problem List Details Patient Name: Date of Service: Steve Brown, Steve Brown 09/25/2018 1:15 PM Medical Record [id number] Patient Account Number: [id number] Date of Birth/Sex: Treating RN: 1940-07-17 (78 y.o. Ernestene Mention Primary Care Provider: Lona Kettle Other Clinician: Sandre Kitty Referring Provider: Treating Provider/Extender:Stone III, Selena Lesser, CHARLES Weeks in Treatment: 0 Active Problems ICD-10 Evaluated Encounter Code Description Active Date Today Diagnosis E11.9 Type 2 diabetes mellitus without complications [id number] No Yes Inactive Problems Resolved Problems Electronic Signature(s) Signed: 09/25/2018 2:43:05 PM By: Worthy Keeler PA-C Entered By: Worthy Keeler on 09/25/2018 14:43:04 -------------------------------------------------------------------------------- Progress Note Details Patient Name: Date of Service: Steve Brown, Steve Brown 09/25/2018 1:15 PM Medical Record [id number] Patient Account Number: [id number] Date of Birth/Sex: Treating RN: February 11, 1940 (78 y.o. Ernestene Mention Primary Care Provider: Lona Kettle Other Clinician: Sandre Kitty Referring Provider: Treating Provider/Extender:Stone III, Selena Lesser, CHARLES Weeks in Treatment: 0 Subjective Chief Complaint Information obtained from Patient Patient arrived for evaluation of his right lower extremity History of Present Illness (HPI) 2/28/118- the patient arrives for evaluation of his right lower extremity. He had a wound to the anterior aspect of his right lower extremity, originally a traumatic injury. He has been treating this with over-the-counter triple antibiotic ointment and leaving open to air. He is a diabetic with his most recent A1c is 7.8, which is a trend down from 9.  He is controlled with oral agents and insulin. He denies any residual pain to his leg. He wanted to be seen to confirm that no additional testing or intervention needed to be performed. His wound is completely epithelialized. Readmission: 09/25/2018 on evaluation today patient presents for reevaluation in our clinic although he has not been seen actually since February 2018. Today he is seen due to what sounds to be maceration breakdown between his toes that he noted. With that being said he was advised by the pharmacist to use new skin to try to help clear this up. He has been putting out between his toes and to be partly honest it seems like it is done well for him  in fact he seems to be excellent upon evaluation today. There is no signs of any infection in fact there is no signs of anything even open at this time. Patient History Information obtained from Patient. Allergies Phenergan (Severity: Moderate), tramadol HCl (Severity: Severe, Reaction: "makes him crazy") Family History Cancer - Father, Diabetes - Siblings, Seizures - Siblings, Stroke - Mother,Siblings, No family history of Heart Disease, Hereditary Spherocytosis, Hypertension, Kidney Disease, Lung Disease, Thyroid Problems, Tuberculosis. Social History Never smoker, Marital Status - Divorced, Alcohol Use - Never, Drug Use - No History, Caffeine Use - Daily - coffee. Medical History Eyes Patient has history of Cataracts - bilateral removed Denies history of Glaucoma, Optic Neuritis Ear/Nose/Mouth/Throat Denies history of Chronic sinus problems/congestion, Middle ear problems Hematologic/Lymphatic Denies history of Anemia, Hemophilia, Human Immunodeficiency Virus, Lymphedema, Sickle Cell Disease Respiratory Patient has history of Sleep Apnea Denies history of Aspiration, Asthma, Chronic Obstructive Pulmonary Disease (COPD), Pneumothorax, Tuberculosis Cardiovascular Patient has history of Coronary Artery Disease, Deep Vein  Thrombosis, Hypertension Denies history of Angina, Arrhythmia, Congestive Heart Failure, Hypotension, Myocardial Infarction, Peripheral Arterial Disease, Peripheral Venous Disease, Phlebitis, Vasculitis Gastrointestinal Denies history of Cirrhosis , Colitis, Crohnoos, Hepatitis A, Hepatitis B, Hepatitis C Endocrine Patient has history of Type II Diabetes Denies history of Type I Diabetes Genitourinary Denies history of End Stage Renal Disease Immunological Denies history of Lupus Erythematosus, Raynaudoos, Scleroderma Integumentary (Skin) Denies history of History of Burn Musculoskeletal Patient has history of Osteoarthritis Denies history of Gout, Rheumatoid Arthritis, Osteomyelitis Neurologic Patient has history of Neuropathy Denies history of Dementia, Quadriplegia, Paraplegia, Seizure Disorder Oncologic Patient has history of Received Radiation - prostatre Denies history of Received Chemotherapy Psychiatric Patient has history of Confinement Anxiety Denies history of Anorexia/bulimia Hospitalization/Surgery History - Cardiac Cath. - Carpal Tunnel Release. - Cervical Disc Surgery. - Chloecystectomy. - Colonoscopy. - Coronary Angioplasty. - Coronary Stent Placement. - Elbow Surgery. - Eye Surgery. - Hernia Repair. - Left Heart Cath w/ Coronary Angiogram. - Fluid pumped off Lungs. Medical And Surgical History Notes Hematologic/Lymphatic Hyperlipidemia Respiratory Pulmonary Embolism Gastrointestinal Cholecystitis GERD Genitourinary Prostatic Hyperplasia Musculoskeletal Lumbago Joint Pain Degenerative Disc Neurologic Hemiparesis Recurrent TIA Stroke Syncope Unsteady Gait White Matter Disease Review of Systems (ROS) Constitutional Symptoms (General Health) Denies complaints or symptoms of Fatigue, Fever, Chills, Marked Weight Change. Eyes Denies complaints or symptoms of Dry Eyes, Vision Changes, Glasses / Contacts. Ear/Nose/Mouth/Throat Denies complaints or symptoms  of Chronic sinus problems or rhinitis. Respiratory Denies complaints or symptoms of Chronic or frequent coughs, Shortness of Breath. Cardiovascular Denies complaints or symptoms of Chest pain. Gastrointestinal Denies complaints or symptoms of Frequent diarrhea, Nausea, Vomiting. Endocrine Denies complaints or symptoms of Heat/cold intolerance. Genitourinary Denies complaints or symptoms of Frequent urination. Integumentary (Skin) Complains or has symptoms of Wounds. Musculoskeletal Denies complaints or symptoms of Muscle Pain, Muscle Weakness. Neurologic Denies complaints or symptoms of Numbness/parasthesias. Psychiatric Denies complaints or symptoms of Claustrophobia, Suicidal. Objective Constitutional sitting or standing blood pressure is within target range for patient.. pulse regular and within target range for patient.Marland Kitchen respirations regular, non-labored and within target range for patient.Marland Kitchen temperature within target range for patient.. Well-nourished and well-hydrated in no acute distress. Vitals Time Taken: 2:09 PM, Height: 65 in, Source: Stated, Weight: 192 lbs, Source: Stated, BMI: 31.9, Temperature: 98.3 F, Pulse: 61 bpm, Respiratory Rate: 16 breaths/min, Blood Pressure: 134/63 mmHg. Eyes conjunctiva clear no eyelid edema noted. pupils equal round and reactive to light and accommodation. Ears, Nose, Mouth, and Throat no gross abnormality of ear auricles or  external auditory canals. normal hearing noted during conversation. mucus membranes moist. Respiratory normal breathing without difficulty. clear to auscultation bilaterally. Cardiovascular regular rate and rhythm with normal S1, S2. 2+ dorsalis pedis/posterior tibialis pulses. no clubbing, cyanosis, significant edema, Gastrointestinal (GI) soft, non-tender, non-distended, +BS. no ventral hernia noted. Musculoskeletal normal gait and posture. no significant deformity or arthritic changes, no loss or range of  motion, no clubbing. Psychiatric this patient is able to make decisions and demonstrates good insight into disease process. Alert and Oriented x 3. pleasant and cooperative. General Notes: Upon evaluation today patient's wound actually showed signs of excellent epithelialization at this time. There does not appear to be any evidence of infection which is good news there is also no signs really of any skin breakdown which is also excellent news. With that being said I really feel like that what he is done with the new skin over the area has protect the region and allow this to heal although I see some evidence for maceration I will discuss options for him to help manage this in the future. Assessment Active Problems ICD-10 Type 2 diabetes mellitus without complications Plan Discharge From [facility] Services: Discharge from [place] [facility] Barriers/Peri-Wound Care: Other: - light application of foot powder to toes daily Additional Orders / Instructions: Other: - inspect feet daily 1. I am in a suggest that the patient currently use the new skin for 1 more week just to protect the region which I think will do well for him. 2. I recommend ongoing after that that he inspect his feet daily and light application of foot powder to the area between his toes would be appropriate in order to help prevent maceration and breakdown. This tends to happen more in the summer which again supports the maceration hypothesis for what is going on with his feet. Obviously today everything looks great. Patient will follow-up as needed with [place] here in the clinic. Electronic Signature(s) Signed: 09/25/2018 7:11:03 PM By: Worthy Keeler PA-C Entered By: Worthy Keeler on 09/25/2018 19:11:03 -------------------------------------------------------------------------------- HxROS Details Patient Name: Date of Service: Steve Brown, Steve Brown 09/25/2018 1:15 PM Medical Record [id number] Patient Account Number:  [id number] Date of Birth/Sex: Treating RN: 1940/02/20 (78 y.o. Steve Brown) Steve Brown Primary Care Provider: Lona Kettle Other Clinician: Sandre Kitty Referring Provider: Treating Provider/Extender:Stone III, Selena Lesser, CHARLES Weeks in Treatment: 0 Information Obtained From Patient Constitutional Symptoms (General Health) Complaints and Symptoms: Negative for: Fatigue; Fever; Chills; Marked Weight Change Eyes Complaints and Symptoms: Negative for: Dry Eyes; Vision Changes; Glasses / Contacts Medical History: Positive for: Cataracts - bilateral removed Negative for: Glaucoma; Optic Neuritis Ear/Nose/Mouth/Throat Complaints and Symptoms: Negative for: Chronic sinus problems or rhinitis Medical History: Negative for: Chronic sinus problems/congestion; Middle ear problems Respiratory Complaints and Symptoms: Negative for: Chronic or frequent coughs; Shortness of Breath Medical History: Positive for: Sleep Apnea Negative for: Aspiration; Asthma; Chronic Obstructive Pulmonary Disease (COPD); Pneumothorax; Tuberculosis Past Medical History Notes: Pulmonary Embolism Cardiovascular Complaints and Symptoms: Negative for: Chest pain Medical History: Positive for: Coronary Artery Disease; Deep Vein Thrombosis; Hypertension Negative for: Angina; Arrhythmia; Congestive Heart Failure; Hypotension; Myocardial Infarction; Peripheral Arterial Disease; Peripheral Venous Disease; Phlebitis; Vasculitis Gastrointestinal Complaints and Symptoms: Negative for: Frequent diarrhea; Nausea; Vomiting Medical History: Negative for: Cirrhosis ; Colitis; Crohns; Hepatitis A; Hepatitis B; Hepatitis C Past Medical History Notes: Cholecystitis GERD Endocrine Complaints and Symptoms: Negative for: Heat/cold intolerance Medical History: Positive for: Type II Diabetes Negative for: Type I Diabetes Time with diabetes: since 1986 Treated with:  Insulin, Oral agents Blood sugar tested every day:  Yes Tested : 2 times per day Blood sugar testing results: Breakfast: 130; Dinner: 130-150 Genitourinary Complaints and Symptoms: Negative for: Frequent urination Medical History: Negative for: End Stage Renal Disease Past Medical History Notes: Prostatic Hyperplasia Integumentary (Skin) Complaints and Symptoms: Positive for: Wounds Medical History: Negative for: History of Burn Musculoskeletal Complaints and Symptoms: Negative for: Muscle Pain; Muscle Weakness Medical History: Positive for: Osteoarthritis Negative for: Gout; Rheumatoid Arthritis; Osteomyelitis Past Medical History Notes: Lumbago Joint Pain Degenerative Disc Neurologic Complaints and Symptoms: Negative for: Numbness/parasthesias Medical History: Positive for: Neuropathy Negative for: Dementia; Quadriplegia; Paraplegia; Seizure Disorder Past Medical History Notes: Hemiparesis Recurrent TIA Stroke Syncope Unsteady Gait White Matter Disease Psychiatric Complaints and Symptoms: Negative for: Claustrophobia; Suicidal Medical History: Positive for: Confinement Anxiety Negative for: Anorexia/bulimia Hematologic/Lymphatic Medical History: Negative for: Anemia; Hemophilia; Human Immunodeficiency Virus; Lymphedema; Sickle Cell Disease Past Medical History Notes: Hyperlipidemia Immunological Medical History: Negative for: Lupus Erythematosus; Raynauds; Scleroderma Oncologic Medical History: Positive for: Received Radiation - prostatre Negative for: Received Chemotherapy HBO Extended History Items Eyes: Cataracts Immunizations Pneumococcal Vaccine: Received Pneumococcal Vaccination: Yes Implantable Devices No devices added Hospitalization / Surgery History Type of Hospitalization/Surgery Cardiac Cath Carpal Tunnel Release Cervical Disc Surgery Chloecystectomy Colonoscopy Coronary Angioplasty Coronary Stent Placement Elbow Surgery Eye Surgery Hernia Repair Left Heart Cath w/ Coronary  Angiogram Fluid pumped off Lungs Family and Social History Cancer: Yes - Father; Diabetes: Yes - Siblings; Heart Disease: No; Hereditary Spherocytosis: No; Hypertension: No; Kidney Disease: No; Lung Disease: No; Seizures: Yes - Siblings; Stroke: Yes - Mother,Siblings; Thyroid Problems: No; Tuberculosis: No; Never smoker; Marital Status - Divorced; Alcohol Use: Never; Drug Use: No History; Caffeine Use: Daily - coffee; Financial Concerns: No; Food, Clothing or Shelter Needs: No; Support System Lacking: No; Transportation Concerns: No Electronic Signature(s) Signed: 09/25/2018 7:17:52 PM By: Worthy Keeler PA-C Signed: 12/17/2018 3:04:22 PM By: Steve Coria RN Entered By: Steve Brown on 09/25/2018 14:14:00 -------------------------------------------------------------------------------- SuperBill Details Patient Name: Date of Service: Steve Brown, Steve Brown 09/25/2018 Medical Record [id number] Patient Account Number: [id number] Date of Birth/Sex: Treating RN: 08-14-1940 (78 y.o. Ernestene Mention Primary Care Provider: Lona Kettle Other Clinician: Sandre Kitty Referring Provider: Treating Provider/Extender:Stone III, Selena Lesser, CHARLES Weeks in Treatment: 0 Diagnosis Coding ICD-10 Codes Code Description E11.9 Type 2 diabetes mellitus without complications Facility Procedures CPT4 Code: [id number] Description: [place] VISIT-LEV 3 EST PT Modifier: Quantity: 1 Physician Procedures CPT4 Code: [id number] Description: [id number] - WC PHYS LEVEL 4 - EST PT ICD-10 Diagnosis Description E11.9 Type 2 diabetes mellitus without complications Modifier: Quantity: 1 Electronic Signature(s) Signed: 09/25/2018 7:11:22 PM By: Worthy Keeler PA-C Previous Signature: 09/25/2018 7:10:00 PM Version By: Baruch Gouty RN, BSN Entered By: Worthy Keeler on 09/25/2018 19:11:22

## 2018-12-17 NOTE — Progress Notes (Signed)
Steve Brown, Steve Brown ([id number]) Visit Report for 09/25/2018 Allergy List Details Patient Name: Date of Service: Steve Brown, Steve Brown 09/25/2018 1:15 PM Medical Record [id number] Patient Account Number: [id number] Date of Birth/Sex: Treating RN: December 12, 1940 (78 y.o. [name]) Carlene Coria Primary Care Danisha Brassfield: Lona Kettle Other Clinician: Sandre Kitty Referring Larz Mark: Treating Leyla Soliz/Extender:Stone III, Selena Lesser, CHARLES Weeks in Treatment: 0 Allergies Active Allergies Phenergan Severity: Moderate tramadol HCl Reaction: "makes him crazy" Severity: Severe Allergy Notes Electronic Signature(s) Signed: 12/17/2018 3:04:22 PM By: Carlene Coria RN Entered By: Carlene Coria on 09/25/2018 14:11:16 -------------------------------------------------------------------------------- [place] Details Patient Name: Date of Service: Steve Brown, Steve Brown 09/25/2018 1:15 PM Medical Record [id number] Patient Account Number: [id number] Date of Birth/Sex: Treating RN: Aug 22, 1940 (78 y.o. [name]) Carlene Coria Primary Care Sage Hammill: Lona Kettle Other Clinician: Sandre Kitty Referring Britiney Blahnik: Treating Oletta Buehring/Extender:Stone III, Selena Lesser, CHARLES Weeks in Treatment: 0 Visit Information Patient Arrived: Ambulatory Arrival Time: 14:07 Accompanied By: self Transfer Assistance: None Patient Identification Verified: Yes Secondary Verification Process Yes Completed: Patient Requires Transmission-Based No Patient Requires Transmission-Based No Precautions: Patient Has Alerts: No History Since Last Visit All ordered tests and consults were completed: No Added or deleted any medications: No Any new allergies or adverse reactions: No Had a fall or experienced change in activities of daily living that may affect risk of falls: No Signs or symptoms of abuse/neglect since last visito No Hospitalized since last visit: No Implantable device outside of the clinic excluding No cellular  tissue based products placed in the center since last visit: Pain Present Now: No Electronic Signature(s) Signed: 12/17/2018 3:04:22 PM By: Carlene Coria RN Entered By: Carlene Coria on 09/25/2018 14:09:06 -------------------------------------------------------------------------------- Clinic Level of Care Assessment Details Patient Name: Date of Service: Steve Brown, Steve Brown 09/25/2018 1:15 PM Medical Record [id number] Patient Account Number: [id number] Date of Birth/Sex: Treating RN: 06-26-1940 (78 y.o. Ernestene Mention Primary Care Evarose Altland: Lona Kettle Other Clinician: Sandre Kitty Referring Tyri Elmore: Treating Milliana Reddoch/Extender:Stone III, Selena Lesser, CHARLES Weeks in Treatment: 0 Clinic Level of Care Assessment Items TOOL 2 Quantity Score []  - Use when only an EandM is performed on the INITIAL visit 0 ASSESSMENTS - Nursing Assessment / Reassessment X - General Physical Exam (combine w/ comprehensive assessment (listed just below) 1 20 when performed on new pt. evals) X - Comprehensive Assessment (HX, ROS, Risk Assessments, Wounds Hx, etc.) 1 25 ASSESSMENTS - Wound and Skin Assessment / Reassessment []  - Simple Wound Assessment / Reassessment - one wound 0 []  - Complex Wound Assessment / Reassessment - multiple wounds 0 X - Dermatologic / Skin Assessment (not related to wound area) 1 10 ASSESSMENTS - Ostomy and/or Continence Assessment and Care []  - Incontinence Assessment and Management 0 []  - Ostomy Care Assessment and Management (repouching, etc.) 0 PROCESS - Coordination of Care X - Simple Patient / Family Education for ongoing care 1 15 []  - Complex (extensive) Patient / Family Education for ongoing care 0 X - Staff obtains Programmer, systems, Records, Test Results / Process Orders 1 10 []  - Staff telephones HHA, Nursing Homes / Clarify orders / etc 0 []  - Routine Transfer to another Facility (non-emergent condition) 0 []  - Routine Hospital Admission (non-emergent condition)  0 []  - New Admissions / Biomedical engineer / Ordering NPWT, Apligraf, etc. 0 []  - Emergency Hospital Admission (emergent condition) 0 X - Simple Discharge Coordination 1 10 []  - Complex (extensive) Discharge Coordination 0 PROCESS - Special Needs []  - Pediatric / Minor Patient Management 0 []  - Isolation Patient Management 0 []  - Hearing / Language / Visual  special needs 0 []  - Assessment of Community assistance (transportation, D/C planning, etc.) 0 []  - Additional assistance / Altered mentation 0 []  - Support Surface(s) Assessment (bed, cushion, seat, etc.) 0 INTERVENTIONS - Wound Cleansing / Measurement []  - Wound Imaging (photographs - any number of wounds) 0 []  - Wound Tracing (instead of photographs) 0 []  - Simple Wound Measurement - one wound 0 []  - Complex Wound Measurement - multiple wounds 0 []  - Simple Wound Cleansing - one wound 0 []  - Complex Wound Cleansing - multiple wounds 0 INTERVENTIONS - Wound Dressings []  - Small Wound Dressing one or multiple wounds 0 []  - Medium Wound Dressing one or multiple wounds 0 []  - Large Wound Dressing one or multiple wounds 0 []  - Application of Medications - injection 0 INTERVENTIONS - Miscellaneous []  - External ear exam 0 []  - Specimen Collection (cultures, biopsies, blood, body fluids, etc.) 0 []  - Specimen(s) / Culture(s) sent or taken to Lab for analysis 0 []  - Patient Transfer (multiple staff / Civil Service fast streamer / Similar devices) 0 []  - Simple Staple / Suture removal (25 or less) 0 []  - Complex Staple / Suture removal (26 or more) 0 []  - Hypo / Hyperglycemic Management (close monitor of Blood Glucose) 0 []  - Ankle / Brachial Index (ABI) - do not check if billed separately 0 Has the patient been seen at the hospital within the last three years: Yes Total Score: 90 Level Of Care: New/Established - Level 3 Electronic Signature(s) Signed: 09/25/2018 7:10:00 PM By: Baruch Gouty RN, BSN Entered By: Baruch Gouty on 09/25/2018  14:50:47 -------------------------------------------------------------------------------- Encounter Discharge Information Details Patient Name: Date of Service: Steve Brown, Steve Brown 09/25/2018 1:15 PM Medical Record [id number] Patient Account Number: [id number] Date of Birth/Sex: Treating RN: August 28, 1940 (78 y.o. Ernestene Mention Primary Care Rolondo Pierre: Lona Kettle Other Clinician: Sandre Kitty Referring Alesana Magistro: Treating Fortune Brannigan/Extender:Stone III, Selena Lesser, CHARLES Weeks in Treatment: 0 Encounter Discharge Information Items Discharge Condition: Stable Ambulatory Status: Ambulatory Discharge Destination: Home Transportation: Private Auto Accompanied By: self Schedule Follow-up Appointment: Yes Clinical Summary of Care: Patient Declined Electronic Signature(s) Signed: 09/25/2018 7:10:00 PM By: Baruch Gouty RN, BSN Entered By: Baruch Gouty on 09/25/2018 14:53:25 -------------------------------------------------------------------------------- Lower Extremity Assessment Details Patient Name: Date of Service: Steve Brown, Steve Brown 09/25/2018 1:15 PM Medical Record [id number] Patient Account Number: [id number] Date of Birth/Sex: Treating RN: 1940/07/26 (78 y.o. [name]) Carlene Coria Primary Care Farha Dano: Lona Kettle Other Clinician: Sandre Kitty Referring Geovani Tootle: Treating Nesta Scaturro/Extender:Stone III, Selena Lesser, CHARLES Weeks in Treatment: 0 Edema Assessment Assessed: [Left: No] [Right: No] E[Left: dema] [Right: :] Calf Left: Right: Point of Measurement: 33 cm From Medial Instep 40 cm 25.5 cm Ankle Left: Right: Point of Measurement: 10 cm From Medial Instep 24 cm 41 cm Vascular Assessment Pulses: Dorsalis Pedis Palpable: [Left:Yes] [Right:Yes] Electronic Signature(s) Signed: 12/17/2018 3:04:22 PM By: Carlene Coria RN Entered By: Carlene Coria on 09/25/2018 14:23:53 -------------------------------------------------------------------------------- Pain  Assessment Details Patient Name: Date of Service: Steve Brown, Steve Brown 09/25/2018 1:15 PM Medical Record [id number] Patient Account Number: [id number] Date of Birth/Sex: Treating RN: 1940/10/09 (78 y.o. [name]) Carlene Coria Primary Care Trestan Vahle: Lona Kettle Other Clinician: Sandre Kitty Referring Alayzia Pavlock: Treating Yisell Sprunger/Extender:Stone III, Selena Lesser, CHARLES Weeks in Treatment: 0 Active Problems Location of Pain Severity and Description of Pain Patient Has Paino No Site Locations Pain Management and Medication Current Pain Management: Electronic Signature(s) Signed: 12/17/2018 3:04:22 PM By: Carlene Coria RN Entered By: Carlene Coria on 09/25/2018 14:24:18 -------------------------------------------------------------------------------- Patient/Caregiver Education Details Patient Name: Date of Service: Steve Brown 9/16/2020andnbsp1:15  PM Medical Record [id number] Patient Account Number: [id number] Date of Birth/Gender: Treating RN: 08-03-1940 (78 y.o. Ernestene Mention Primary Care Physician: Lona Kettle Other Clinician: Sandre Kitty Referring Physician: Treating Physician/Extender:Stone III, Selena Lesser, CHARLES Weeks in Treatment: 0 Education Assessment Education Provided To: Patient Education Topics Provided Elevated Blood Sugar/ Impact on Healing: Wound/Skin Impairment: Handouts: Caring for Your Ulcer, Skin Care Do's and Dont's Methods: Explain/Verbal, Printed Responses: Reinforcements needed Electronic Signature(s) Signed: 09/25/2018 7:10:00 PM By: Baruch Gouty RN, BSN Entered By: Baruch Gouty on 09/25/2018 14:49:59 -------------------------------------------------------------------------------- [place] Details Patient Name: Date of Service: Steve Brown, Steve Brown 09/25/2018 1:15 PM Medical Record [id number] Patient Account Number: [id number] Date of Birth/Sex: Treating RN: 07-24-1940 (78 y.o. [name]) Carlene Coria Primary Care Shriyan Arakawa: Lona Kettle Other Clinician: Sandre Kitty Referring Granvil Djordjevic: Treating Owen Pagnotta/Extender:Stone III, Selena Lesser, CHARLES Weeks in Treatment: 0 Vital Signs Time Taken: 14:09 Temperature (F): 98.3 Height (in): 65 Pulse (bpm): 61 Source: Stated Respiratory Rate (breaths/min): 16 Weight (lbs): 192 Blood Pressure (mmHg): 134/63 Source: Stated Reference Range: 80 - 120 mg / dl Body Mass Index (BMI): 31.9 Electronic Signature(s) Signed: 12/17/2018 3:04:22 PM By: Carlene Coria RN Entered By: Carlene Coria on 09/25/2018 14:11:07

## 2018-12-17 NOTE — Progress Notes (Signed)
Steve Brown, Steve Brown ([id number]) Visit Report for 09/25/2018 Abuse/Suicide Risk Screen Details Patient Name: Date of Service: Steve Brown, Steve Brown 09/25/2018 1:15 PM Medical Record [id number] Patient Account Number: [id number] Date of Birth/Sex: Treating RN: Mar 22, 1940 (78 y.o. Steve Brown) Carlene Coria Primary Care Steve Brown: Steve Brown Other Clinician: Sandre Brown Referring Steve Brown: Treating Steve Brown/Extender:Steve Brown, Steve Brown, Steve Brown in Treatment: 0 Abuse/Suicide Risk Screen Items Answer ABUSE RISK SCREEN: Has anyone close to you tried to hurt or harm you recentlyo No Do you feel uncomfortable with anyone in your familyo No Has anyone forced you do things that you didnt want to doo No Electronic Signature(s) Signed: 12/17/2018 3:04:22 PM By: Carlene Coria RN Entered By: Carlene Coria on 09/25/2018 14:14:19 -------------------------------------------------------------------------------- Activities of Daily Living Details Patient Name: Date of Service: Steve Brown, Steve Brown 09/25/2018 1:15 PM Medical Record [id number] Patient Account Number: [id number] Date of Birth/Sex: Treating RN: 1940-07-29 (78 y.o. Steve Brown) Carlene Coria Primary Care Steve Brown: Steve Brown Other Clinician: Sandre Brown Referring Steve Brown: Treating Steve Brown/Extender:Steve Brown, Steve Brown, Steve Brown in Treatment: 0 Activities of Daily Living Items Answer Activities of Daily Living (Please select one for each item) Drive Automobile Completely Able Take Medications Completely Able Use Telephone Completely [place] for Appearance Completely Able Use Toilet Completely [place] / Shower Completely Able Dress Self Completely Able Feed Self Completely Able Walk Completely Able Get In / Out Bed Completely [place] for Self Completely Able Electronic Signature(s) Signed: 12/17/2018 3:04:22 PM By: Carlene Coria  RN Entered By: Carlene Coria on 09/25/2018 14:14:52 -------------------------------------------------------------------------------- Education Screening Details Patient Name: Date of Service: Steve Brown 09/25/2018 1:15 PM Medical Record [id number] Patient Account Number: [id number] Date of Birth/Sex: Treating RN: 28-Aug-1940 (78 y.o. Steve Brown) Carlene Coria Primary Care Steve Brown: Steve Brown Other Clinician: Sandre Brown Referring Steve Brown: Treating Steve Brown/Extender:Steve Brown, Steve Brown, Steve Brown in Treatment: 0 Primary Learner Assessed: Patient Learning Preferences/Education Level/Primary Language Learning Preference: Explanation Highest Education Level: High School Preferred Language: English Cognitive Barrier Language Barrier: No Translator Needed: No Memory Deficit: No Emotional Barrier: No Cultural/Religious Beliefs Affecting Medical Care: No Physical Barrier Impaired Vision: No Impaired Hearing: No Decreased Hand dexterity: No Knowledge/Comprehension Knowledge Level: High Comprehension Level: High Ability to understand written High instructions: Ability to understand verbal High instructions: Motivation Anxiety Level: Calm Cooperation: Cooperative Education Importance: Acknowledges Need Interest in Health Problems: Asks Questions Perception: Coherent Willingness to Engage in Self- High Management Activities: Readiness to Engage in Self- High Management Activities: Electronic Signature(s) Signed: 12/17/2018 3:04:22 PM By: Carlene Coria RN Entered By: Carlene Coria on 09/25/2018 14:15:40 -------------------------------------------------------------------------------- Fall Risk Assessment Details Patient Name: Date of Service: Steve Brown, Steve Brown 09/25/2018 1:15 PM Medical Record [id number] Patient Account Number: [id number] Date of Birth/Sex: Treating RN: [birth year] (78 y.o. Steve Brown) Carlene Coria Primary Care Steve Brown: Steve Brown Other  Clinician: Sandre Brown Referring Steve Brown: Treating Steve Brown/Extender:Steve Brown, Steve Brown, Steve Brown in Treatment: 0 Fall Risk Assessment Items Have you had 2 or more falls in the last 12 monthso 0 No Have you had any fall that resulted in injury in the last 12 monthso 0 No FALLS RISK SCREEN History of falling - immediate or within 3 months 0 No Secondary diagnosis (Do you have 2 or more medical diagnoseso) 0 No Ambulatory aid None/bed rest/wheelchair/nurse 0 No Crutches/cane/walker 0 No Furniture 0 No Intravenous therapy Access/Saline/Heparin Lock 0 No Weak (short steps with or without shuffle, stooped but able to lift head 0 No while walking, may seek support from furniture) Impaired (  short steps with shuffle, may have difficulty arising from chair, 0 No head down, impaired balance) Mental Status Oriented to own ability 0 No Overestimates or forgets limitations 0 No Risk Level: Low Risk Score: 0 Electronic Signature(s) Signed: 12/17/2018 3:04:22 PM By: Carlene Coria RN Entered By: Carlene Coria on 09/25/2018 14:15:56 -------------------------------------------------------------------------------- Foot Assessment Details Patient Name: Date of Service: Steve Brown, Steve Brown 09/25/2018 1:15 PM Medical Record [id number] Patient Account Number: [id number] Date of Birth/Sex: Treating RN: [birth year] (78 y.o. Steve Brown) Carlene Coria Primary Care Arne Schlender: Steve Brown Other Clinician: Sandre Brown Referring Karron Goens: Treating Steve Brown/Extender:Steve Brown, Steve Brown, Steve Brown in Treatment: 0 Foot Assessment Items Site Locations + = Sensation present, - = Sensation absent, C = Callus, U = Ulcer R = Redness, W = Warmth, M = Maceration, PU = Pre-ulcerative lesion F = Fissure, S = Swelling, D = Dryness Assessment Right: Left: Other Deformity: No No Prior Foot Ulcer: No No Prior Amputation: No No Charcot Joint: No No Ambulatory Status: Ambulatory Without Help Gait:  Steady Electronic Signature(s) Signed: 12/17/2018 3:04:22 PM By: Carlene Coria RN Entered By: Carlene Coria on 09/25/2018 14:22:12 -------------------------------------------------------------------------------- Nutrition Risk Screening Details Patient Name: Date of Service: Steve Brown, Steve Brown 09/25/2018 1:15 PM Medical Record [id number] Patient Account Number: [id number] Date of Birth/Sex: Treating RN: 24-Jun-1940 (78 y.o. Steve Brown) Carlene Coria Primary Care Perkins Molina: Steve Brown Other Clinician: Sandre Brown Referring Bryon Parker: Treating Shinita Mac/Extender:Steve Brown, Steve Brown, Steve Brown in Treatment: 0 Height (in): 65 Weight (lbs): 192 Body Mass Index (BMI): 31.9 Nutrition Risk Screening Items Score Screening NUTRITION RISK SCREEN: I have an illness or condition that made me change the kind and/or 0 No amount of food I eat I eat fewer than two meals per day 0 No I eat few fruits and vegetables, or milk products 0 No I have three or more drinks of beer, liquor or wine almost every day 0 No I have tooth or mouth problems that make it hard for me to eat 0 No I don't always have enough money to buy the food I need 0 No I eat alone most of the time 0 No I take three or more different prescribed or over-the-counter drugs a day 1 Yes 0 No Without wanting to, I have lost or gained 10 pounds in the last six months I am not always physically able to shop, cook and/or feed myself 0 No Nutrition Protocols Good Risk Protocol 0 No interventions needed Moderate Risk Protocol High Risk Proctocol Risk Level: Good Risk Score: 1 Electronic Signature(s) Signed: 12/17/2018 3:04:22 PM By: Carlene Coria RN Entered By: Carlene Coria on 09/25/2018 14:16:11

## 2018-12-18 LAB — URINE CULTURE: Culture: >=100000 — AB

## 2018-12-19 ENCOUNTER — Telehealth: Payer: Self-pay | Admitting: *Deleted

## 2018-12-19 NOTE — Telephone Encounter (Signed)
Post ED Visit - Positive Culture Follow-up  Culture report reviewed by antimicrobial stewardship pharmacist: [place] Team []  Elenor Quinones, Pharm.D. []  Heide Guile, Pharm.D., BCPS AQ-ID []  Parks Neptune, Pharm.D., BCPS []  Alycia Rossetti, Pharm.D., BCPS []  [place], Pharm.D., BCPS, AAHIVP []  Legrand Como, Pharm.D., BCPS, AAHIVP []  Salome Arnt, PharmD, BCPS []  Johnnette Gourd, PharmD, BCPS []  Hughes Better, PharmD, BCPS []  Leeroy Cha, PharmD []  Laqueta Linden, PharmD, BCPS []  Albertina Parr, PharmD  [place] Team []  Leodis Sias, PharmD []  Lindell Spar, PharmD []  Royetta Asal, PharmD []  Graylin Shiver, Rph []  Rema Fendt) Glennon Mac, PharmD []  Arlyn Dunning, PharmD []  Netta Cedars, PharmD []  Dia Sitter, PharmD []  Leone Haven, PharmD []  Gretta Arab, PharmD []  Theodis Shove, PharmD []  Peggyann Juba, PharmD []  Reuel Boom, PharmD Elicia Lamp, PharmD  Positive urine culture Treated with Cephalexin, organism sensitive to the same and no further patient follow-up is required at this time.  Harlon Flor Talley 12/19/2018, 1:31 PM

## 2018-12-23 IMAGING — CR DG CERVICAL SPINE 2 OR 3 VIEWS
3 series · 3 of 3 positions shown · non-contrast
Comparison: Cervical spine radiograph 03/09/2010.

CLINICAL DATA: Patient with cervical spine pain for multiple days.
No injury.

EXAM:
CERVICAL SPINE - 2-3 VIEW

[w c-spine lat *]
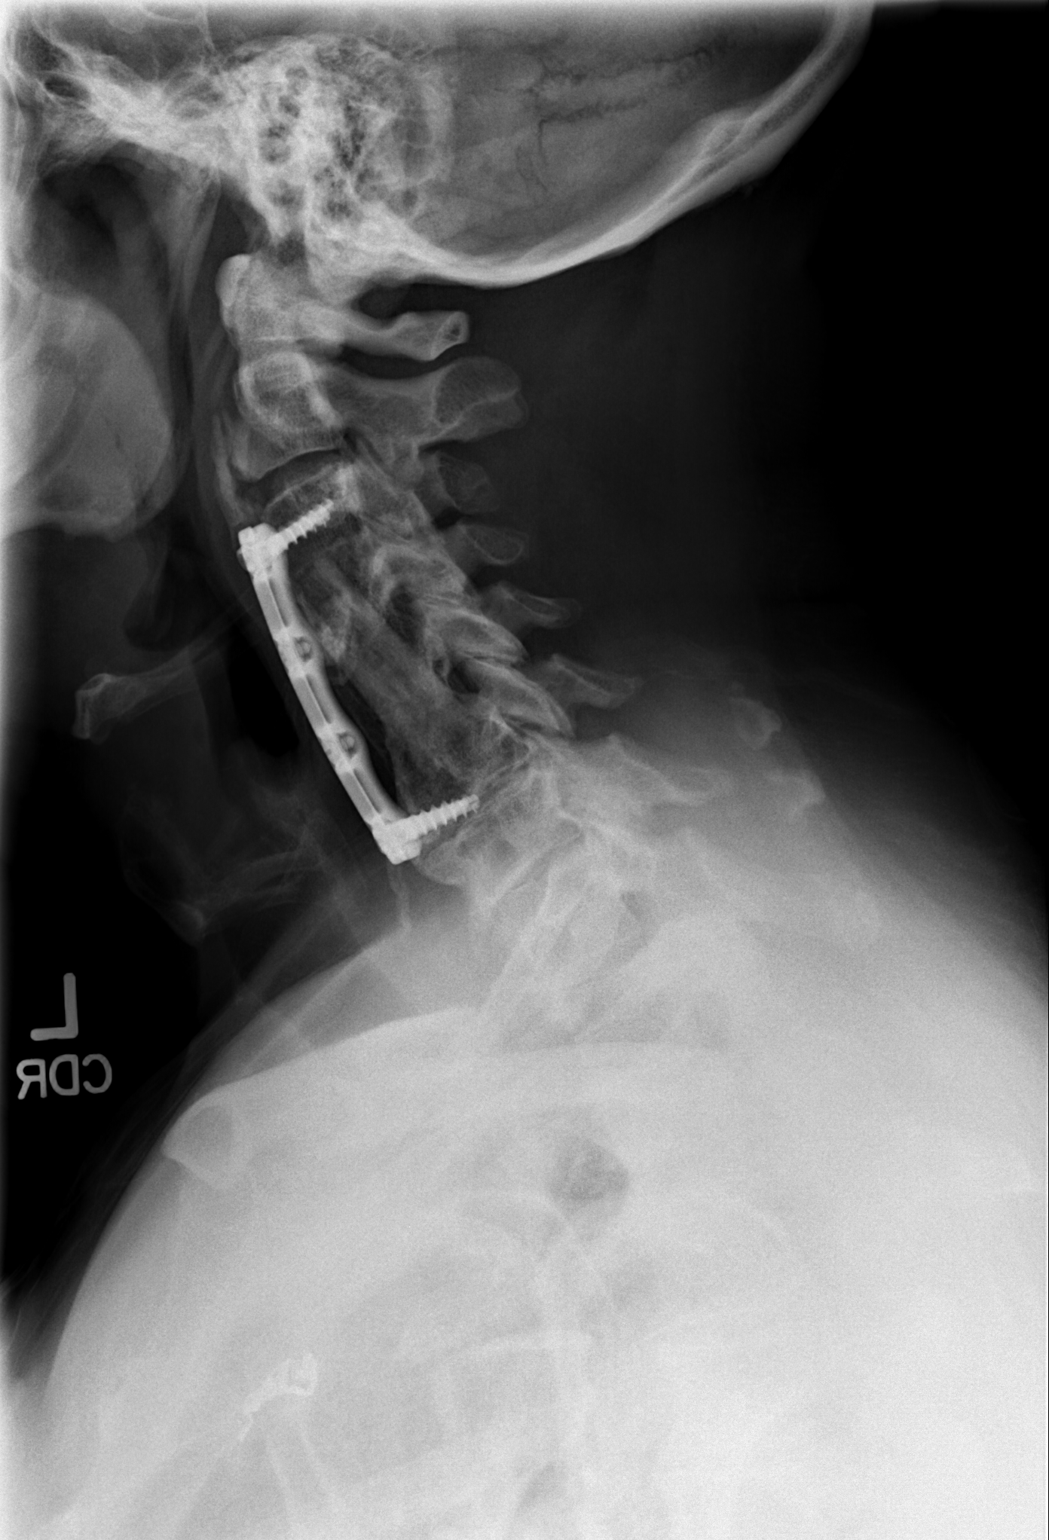

[w c-spine a.p. *]
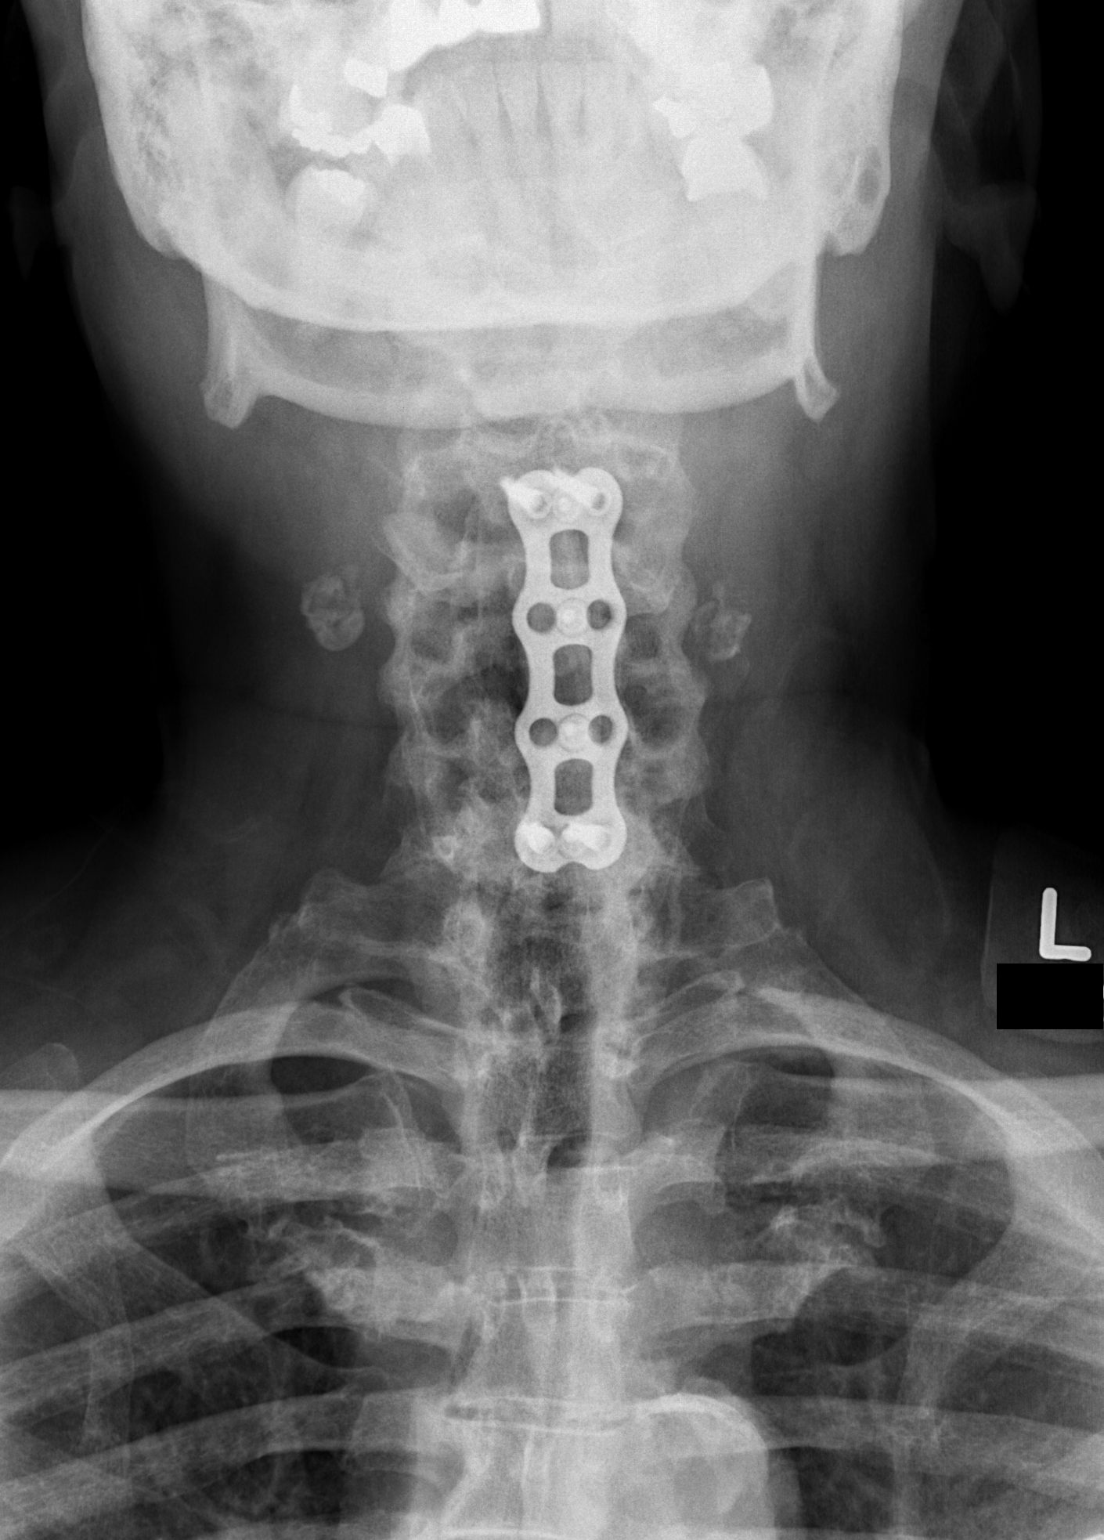

[w c-spine odontoid]
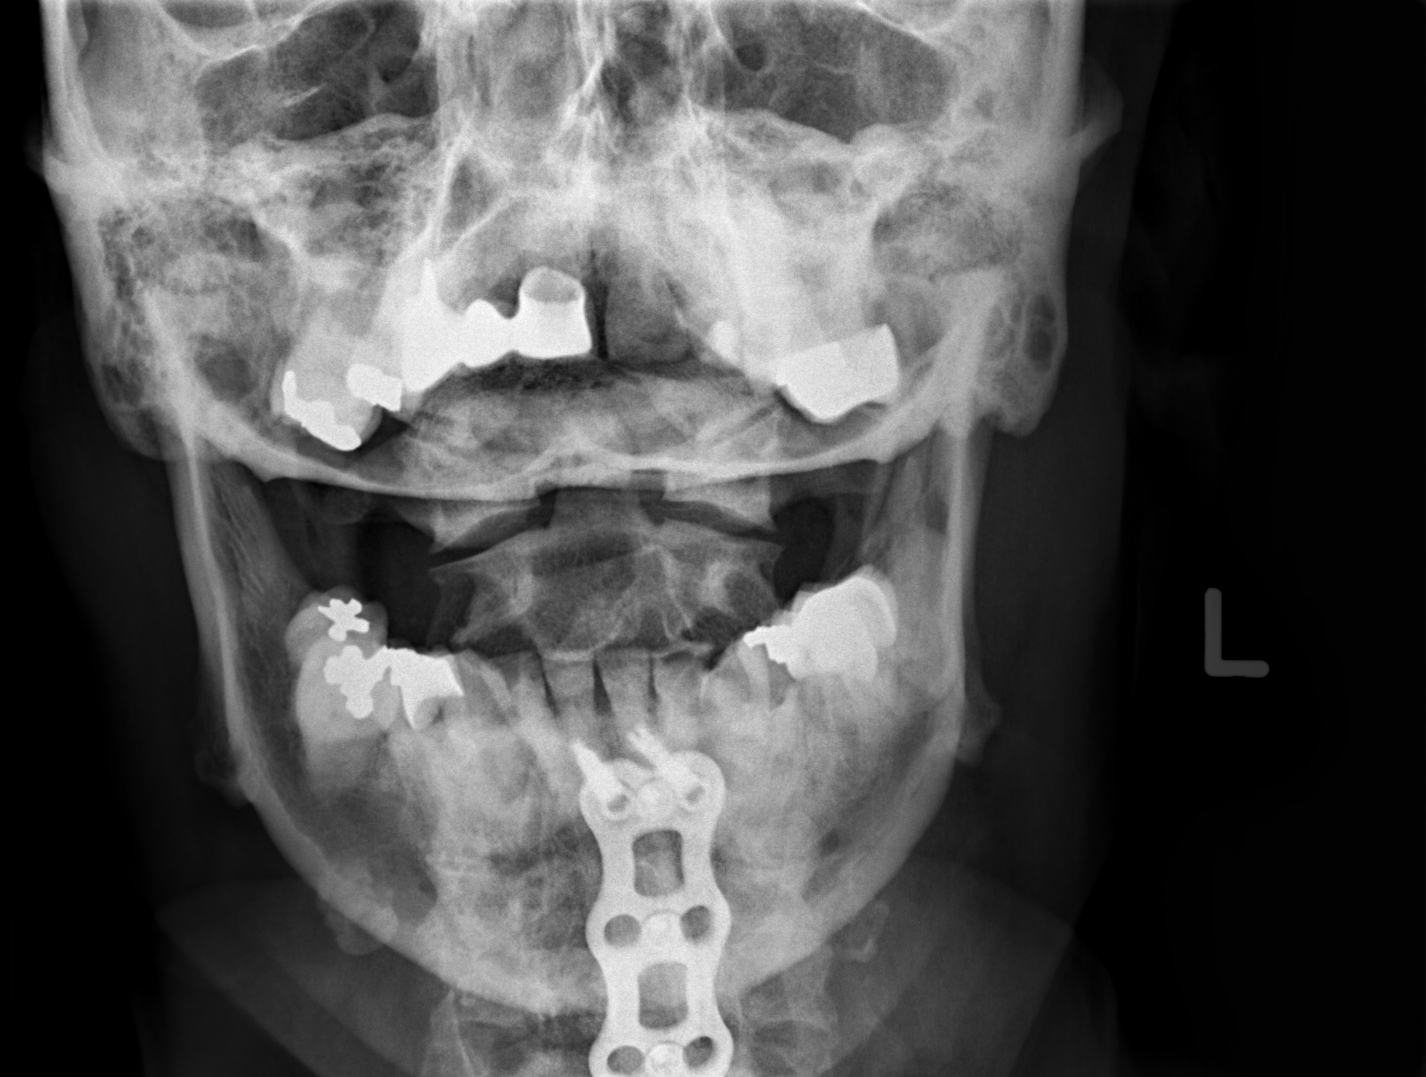

[3 of 3 positions shown; findings below may reference images not displayed]

FINDINGS: Multilevel anterior cervical spinal fusion is demonstrated. Fusion
and anterior cervical spinal hardware appear stable when compared to
prior exam. Prevertebral soft tissues are unremarkable. Lung apices
are clear.
IMPRESSION: Multilevel anterior cervical spinal fusion similar in appearance
when compared to prior exam. No definite acute osseous abnormality.

## 2019-01-12 ENCOUNTER — Other Ambulatory Visit: Payer: Self-pay

## 2019-01-12 ENCOUNTER — Emergency Department (HOSPITAL_BASED_OUTPATIENT_CLINIC_OR_DEPARTMENT_OTHER)
Admission: EM | Admit: 2019-01-12 | Discharge: 2019-01-12 | Disposition: A | Discharge status: Home / Self Care | Payer: Medicare Other | Attending: Emergency Medicine | Admitting: Emergency Medicine

## 2019-01-12 ENCOUNTER — Emergency Department (HOSPITAL_BASED_OUTPATIENT_CLINIC_OR_DEPARTMENT_OTHER): Payer: Medicare Other

## 2019-01-12 ENCOUNTER — Encounter (HOSPITAL_BASED_OUTPATIENT_CLINIC_OR_DEPARTMENT_OTHER): Payer: Self-pay | Admitting: Emergency Medicine

## 2019-01-12 DIAGNOSIS — R5383 Other fatigue: Secondary | ICD-10-CM | POA: Insufficient documentation

## 2019-01-12 DIAGNOSIS — M7918 Myalgia, other site: Secondary | ICD-10-CM | POA: Diagnosis not present

## 2019-01-12 DIAGNOSIS — I129 Hypertensive chronic kidney disease with stage 1 through stage 4 chronic kidney disease, or unspecified chronic kidney disease: Secondary | ICD-10-CM | POA: Insufficient documentation

## 2019-01-12 DIAGNOSIS — F0150 Vascular dementia without behavioral disturbance: Secondary | ICD-10-CM | POA: Insufficient documentation

## 2019-01-12 DIAGNOSIS — R0602 Shortness of breath: Secondary | ICD-10-CM | POA: Diagnosis not present

## 2019-01-12 DIAGNOSIS — Z79899 Other long term (current) drug therapy: Secondary | ICD-10-CM | POA: Diagnosis not present

## 2019-01-12 DIAGNOSIS — Z20822 Contact with and (suspected) exposure to covid-19: Secondary | ICD-10-CM | POA: Diagnosis not present

## 2019-01-12 DIAGNOSIS — I2510 Atherosclerotic heart disease of native coronary artery without angina pectoris: Secondary | ICD-10-CM | POA: Diagnosis not present

## 2019-01-12 DIAGNOSIS — Z794 Long term (current) use of insulin: Secondary | ICD-10-CM | POA: Diagnosis not present

## 2019-01-12 DIAGNOSIS — R42 Dizziness and giddiness: Secondary | ICD-10-CM | POA: Diagnosis not present

## 2019-01-12 DIAGNOSIS — N182 Chronic kidney disease, stage 2 (mild): Secondary | ICD-10-CM | POA: Diagnosis not present

## 2019-01-12 DIAGNOSIS — R609 Edema, unspecified: Secondary | ICD-10-CM | POA: Diagnosis not present

## 2019-01-12 DIAGNOSIS — E1122 Type 2 diabetes mellitus with diabetic chronic kidney disease: Secondary | ICD-10-CM | POA: Diagnosis not present

## 2019-01-12 DIAGNOSIS — E785 Hyperlipidemia, unspecified: Secondary | ICD-10-CM | POA: Diagnosis not present

## 2019-01-12 DIAGNOSIS — Z20828 Contact with and (suspected) exposure to other viral communicable diseases: Secondary | ICD-10-CM | POA: Insufficient documentation

## 2019-01-12 DIAGNOSIS — Z8546 Personal history of malignant neoplasm of prostate: Secondary | ICD-10-CM | POA: Diagnosis not present

## 2019-01-12 DIAGNOSIS — Z8673 Personal history of transient ischemic attack (TIA), and cerebral infarction without residual deficits: Secondary | ICD-10-CM | POA: Diagnosis not present

## 2019-01-12 DIAGNOSIS — M5489 Other dorsalgia: Secondary | ICD-10-CM | POA: Diagnosis not present

## 2019-01-12 LAB — CBC WITH DIFFERENTIAL/PLATELET
Abs Immature Granulocytes: 0.01 K/uL (ref 0.00–0.07)
Basophils Absolute: 0 K/uL (ref 0.0–0.1)
Basophils Relative: 0 %
Eosinophils Absolute: 0 K/uL (ref 0.0–0.5)
Eosinophils Relative: 0 %
HCT: 40.9 % (ref 39.0–52.0)
Hemoglobin: 13.8 g/dL (ref 13.0–17.0)
Immature Granulocytes: 0 %
Lymphocytes Relative: 33 %
Lymphs Abs: 1.8 K/uL (ref 0.7–4.0)
MCH: 32.6 pg (ref 26.0–34.0)
MCHC: 33.7 g/dL (ref 30.0–36.0)
MCV: 96.7 fL (ref 80.0–100.0)
Monocytes Absolute: 0.6 K/uL (ref 0.1–1.0)
Monocytes Relative: 12 %
Neutro Abs: 2.9 K/uL (ref 1.7–7.7)
Neutrophils Relative %: 55 %
Platelets: 166 K/uL (ref 150–400)
RBC: 4.23 MIL/uL (ref 4.22–5.81)
RDW: 12.4 % (ref 11.5–15.5)
WBC: 5.4 K/uL (ref 4.0–10.5)
nRBC: 0 % (ref 0.0–0.2)

## 2019-01-12 LAB — URINALYSIS, ROUTINE W REFLEX MICROSCOPIC
Bilirubin Urine: NEGATIVE
Glucose, UA: NEGATIVE mg/dL
Hgb urine dipstick: NEGATIVE
Ketones, ur: NEGATIVE mg/dL
Leukocytes,Ua: NEGATIVE
Nitrite: NEGATIVE
Protein, ur: NEGATIVE mg/dL
Specific Gravity, Urine: 1.01 (ref 1.005–1.030)
pH: 6.5 (ref 5.0–8.0)

## 2019-01-12 LAB — COMPREHENSIVE METABOLIC PANEL
ALT: 25 U/L (ref 0–44)
AST: 22 U/L (ref 15–41)
Albumin: 4 g/dL (ref 3.5–5.0)
Alkaline Phosphatase: 69 U/L (ref 38–126)
Anion gap: 7 (ref 5–15)
BUN: 15 mg/dL (ref 8–23)
CO2: 30 mmol/L (ref 22–32)
Calcium: 9.1 mg/dL (ref 8.9–10.3)
Chloride: 103 mmol/L (ref 98–111)
Creatinine, Ser: 1.19 mg/dL (ref 0.61–1.24)
GFR calc Af Amer: >60 mL/min (ref >60)
GFR calc non Af Amer: 59 mL/min — ABNORMAL LOW (ref >60)
Glucose, Bld: 95 mg/dL (ref 70–99)
Potassium: 3.9 mmol/L (ref 3.5–5.1)
Sodium: 140 mmol/L (ref 135–145)
Total Bilirubin: 0.7 mg/dL (ref 0.3–1.2)
Total Protein: 7 g/dL (ref 6.5–8.1)

## 2019-01-12 LAB — TROPONIN I (HIGH SENSITIVITY): Troponin I (High Sensitivity): 11 ng/L (ref <18)

## 2019-01-12 LAB — BRAIN NATRIURETIC PEPTIDE: B Natriuretic Peptide: 53.7 pg/mL (ref 0.0–100.0)

## 2019-01-12 LAB — SARS CORONAVIRUS 2 AG (30 MIN TAT): SARS Coronavirus 2 Ag: NEGATIVE

## 2019-01-12 NOTE — ED Triage Notes (Signed)
Per EMS, pt c/o dizziness, low back pain, and abd swelling x 3 days.

## 2019-01-12 NOTE — Discharge Instructions (Addendum)
Please follow up with your cardiologist next week as scheduled.

## 2019-01-12 NOTE — ED Notes (Signed)
Provided urinal. °

## 2019-01-12 NOTE — ED Provider Notes (Signed)
[facility] EMERGENCY DEPARTMENT Provider Note   CSN: [id number] Arrival date & time: 01/12/19  1240     History Chief Complaint  Patient presents with  . Dizziness    Steve Brown is a 78 y.o. male presenting to ED with constellation of symptoms  Fatigue, myalgia x 3 days Exertional lightheadedness Chronic low back pain, not new, reports he was told he needs surgery near his spine but has declined due to his age. No fevers chills  Came in today because pain was getting worse, and he had been feeling lightheaded and weak performing his daily activities at home  Symptoms were not solely exertional   HPI     Past Medical History:  Diagnosis Date  . Abnormal prostate biopsy   . Anticoagulant long-term use    currently xarelto  . BPH with elevated PSA   . CKD (chronic kidney disease), stage II   . Complication of anesthesia    limted neck rom limited use of left arm due to cva  . Coronary artery disease    CARDIOLOGIST-  DR Irish Lack--  2010-- PCI w/ stenting midLAD  . DDD (degenerative disc disease), lumbar   . Degeneration of cervical intervertebral disc   . Depression   . Dyspnea on exertion   . GERD (gastroesophageal reflux disease)   . Hemiparesis due to cerebral infarction   . History of cerebrovascular accident (CVA) with residual deficit 2002 and 2003--  hemiparisis both sides   per MRI  anterior left frontal lobe, left para midline pons, and inferior cerebullam bilaterally infarcts  . History of pulmonary embolus (PE)    06-30-2012  extensive bilaterally  . History of recurrent TIAs   . History of syncope    hx multiple pre-syncope and syncopal episodes due to vasovagal, orthostatic hypotension, dehydration  . History of TIAs    several since 2002  . Hyperlipidemia   . Hypertension   . Mild atherosclerosis of carotid artery, bilateral    per last duplex 11-04-2014  bilateral ICA 1--39%  . Neuropathy    fingers  . OSA on CPAP    followed  by dr dohmeier--  sev. osa w/ AHI 65.9  . Prostate cancer ([place]) dx 2018  . Renal insufficiency   . S/P coronary artery stent placement 2010   stenting to mid LAD  . Simple renal cyst    bilaterally  . Stroke ([place])   . Trigger finger of both hands 11-17-13  . Type 2 diabetes mellitus ([place]) dx 1986   last one A1c 9.2 on 04-26-2016  . Unsteady gait    . Hx prior CVA/TIAs;  . Vertebral artery occlusion, left    chronic    Patient Active Problem List   Diagnosis Date Noted  . 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 ([place]) 10/21/2018  . Nocturnal hypoxemia 10/21/2018  . Radiation therapy complication [id number]  . Primary prostate cancer ([place]) 07/31/2017  . Anemia 07/31/2017  . Vertigo due to cerebrovascular disease 07/31/2017  . Poor compliance with CPAP treatment 07/31/2017  . Absolute anemia 05/16/2017  . Avitaminosis D 05/16/2017  . Benign essential HTN 05/16/2017  . Benign prostatic hypertrophy without urinary obstruction 05/16/2017  . Clinical depression 05/16/2017  . CN (constipation) 05/16/2017  . Current drug use 05/16/2017  . Diabetic neuropathy ([place]) 05/16/2017  . Genital herpes 05/16/2017  . Infarction of lung due to iatrogenic pulmonary embolism ([place]) 05/16/2017  . Peripheral neuralgia 05/16/2017  . Arteriosclerosis of coronary artery 05/16/2017  . Artery disease,  cerebral 05/16/2017  . Apnea, sleep 05/16/2017  . Arthralgia of hip or thigh 05/16/2017  . Malignant neoplasm of prostate ([place]) 09/28/2016  . Vascular dementia in remission ([place]) 09/28/2016  . Remote history of stroke 09/28/2016  . Acute kidney injury ([place]) 05/18/2016  . Dehydration 05/18/2016  . Near syncope 05/18/2016  . Orthostatic hypotension 05/18/2016  . CKD (chronic kidney disease), stage II 04/26/2016  . UTI (urinary tract infection) 04/26/2016  . Encounter for counseling on use of CPAP 11/18/2015  . Chronic cholecystitis with calculus 11/02/2015  . Stroke, vertebral artery  ([place]) 04/22/2015  . TIA (transient ischemic attack) 11/03/2014  . OSA on CPAP 05/18/2014  . White matter disease 08/14/2013  . OSA (obstructive sleep apnea) 08/14/2013  . Abnormal x-ray of temporomandibular joint 05/30/2013  . Chronic infection of sinus 05/30/2013  . Cough 05/30/2013  . Fatigue 05/30/2013  . Unsteady gait 05/19/2013  . Combined fat and carbohydrate induced hyperlipemia 04/24/2013  . Syncope 03/20/2013  . Angina pectoris ([place]) 10/08/2012  . Hypertension   . Stroke ([place])   . History of TIAs   . Lumbago   . Other and unspecified hyperlipidemia   . Personal history of unspecified circulatory disease   . Unspecified fall   . Pain in joint, multiple sites   . Degeneration of cervical intervertebral disc   . Unspecified cardiovascular disease   . History of pulmonary embolism: June 2014,  Takes Xarelto 07/01/2012    Class: History of  . Hemiparesis ([place]) 07/18/2011  . Diabetes mellitus type 2 with complications ([place]) 99991111  . HTN (hypertension) 07/18/2011  . CAD in native artery 07/18/2011  . History of recurrent TIAs 07/18/2011    Past Surgical History:  Procedure Laterality Date  . ANTERIOR CERVICAL DECOMP/DISCECTOMY FUSION  2004   C3 -- C6 limited rom  . CARDIAC CATHETERIZATION  06-10-2010   dr Irish Lack   wide patent LAD stent, mid lesion at the origin of the septal prior to the previous stent 40-50%/  normal LVF, ef 55%  . CARDIOVASCULAR STRESS TEST  10-23-2012  dr Irish Lack   normal nuclear perfusion study w/ no ischemia/  normal LV function and wall motion , ef 65%  . CARPAL TUNNEL RELEASE Bilateral   . CATARACT EXTRACTION W/ INTRAOCULAR LENS  IMPLANT, BILATERAL    . CHOLECYSTECTOMY N/A 11/02/2015   Procedure: LAPAROSCOPIC CHOLECYSTECTOMY WITH INTRAOPERATIVE CHOLANGIOGRAM;  Surgeon: Donnie Mesa, MD;  Location: [facility];  Service: General;  Laterality: N/A;  . COLONOSCOPY    . CORONARY ANGIOPLASTY WITH STENT PLACEMENT  02/2008   stenting to mid LAD  .  GOLD SEED IMPLANT N/A 11/15/2016   Procedure: GOLD SEED IMPLANT TIMES THREE;  Surgeon: Ardis Hughs, MD;  Location: [facility];  Service: Urology;  Laterality: N/A;  . IR ANGIO INTRA EXTRACRAN SEL COM CAROTID INNOMINATE BILAT MOD SED  06/13/2018  . IR ANGIO VERTEBRAL SEL VERTEBRAL UNI R MOD SED  06/13/2018  . IR US GUIDE VASC ACCESS RIGHT  06/13/2018  . LEFT HEART CATH AND CORONARY ANGIOGRAPHY N/A 05/25/2017   Procedure: LEFT HEART CATH AND CORONARY ANGIOGRAPHY;  Surgeon: Jettie Booze, MD;  Location: [facility] CV LAB;  Service: Cardiovascular;  Laterality: N/A;  . LEFT HEART CATHETERIZATION WITH CORONARY ANGIOGRAM N/A 04/03/2013   Procedure: LEFT HEART CATHETERIZATION WITH CORONARY ANGIOGRAM;  Surgeon: Jettie Booze, MD;  Location[facility] CATH LAB;  Service: Cardiovascular;  Laterality: N/A;  patent mLAD stent  w/ mild disease in remainder LAD and  its branches;  mod. focal lesion midLCFx- FFR of lesion was negative for ischemia/  normal LVSF, ef 50%  . lungs  2005   "fluid pumped off lungs"  . NEUROPLASTY / TRANSPOSITION ULNAR NERVE AT ELBOW Right 2004  . PROSTATE BIOPSY N/A 08/31/2016   Procedure: PROSTATE  BIOPSY TRANSRECTAL ULTRASONIC PROSTATE (TUBP);  Surgeon: Ardis Hughs, MD;  Location: [facility];  Service: Urology;  Laterality: N/A;  . SPACE OAR INSTILLATION N/A 11/15/2016   Procedure: SPACE OAR INSTILLATION;  Surgeon: Ardis Hughs, MD;  Location: [facility];  Service: Urology;  Laterality: N/A;  . TRANSTHORACIC ECHOCARDIOGRAM  04/27/2016   severe focal basal LVH, ef 60-65%,  grade 2 diastoilc dysfunction/  mild AR, MR, and TR/  atrial septum lipomatous hypertrophy/  PASP 41mmHg  . UMBILICAL HERNIA REPAIR         Family History  Problem Relation Age of Onset  . Aneurysm Mother   . Cancer Father        unknown either pancreatic or prostate  . Stroke Brother   . Dementia Sister   . Heart attack Neg Hx      Social History   Tobacco Use  . Smoking status: Never Smoker  . Smokeless tobacco: Never Used  Substance Use Topics  . Alcohol use: No    Alcohol/week: 0.0 standard drinks  . Drug use: No    Home Medications Prior to Admission medications   Medication Sig Start Date End Date Taking? Authorizing Provider  acetaminophen (TYLENOL) 500 MG tablet Take 1,000 mg by mouth every 6 (six) hours as needed (pain).     [provider]  albuterol (PROVENTIL HFA;VENTOLIN HFA) 108 (90 Base) MCG/ACT inhaler Inhale 2 puffs into the lungs every 6 (six) hours as needed for wheezing or shortness of breath.    [provider]  Ascorbic Acid (VITAMIN C PO) Take 1 tablet by mouth daily.    [provider]  bisacodyl (DULCOLAX) 5 MG EC tablet Take 5 mg by mouth daily as needed for moderate constipation.    [provider]  cholecalciferol (VITAMIN D) 1000 UNITS tablet Take 1,000 Units by mouth daily.    [provider]  diclofenac sodium (VOLTAREN) 1 % GEL Apply 1 application topically See admin instructions. Apply topically once daily , may use an additional time as needed for pain 10/16/16   [provider]  Echinacea 450 MG CAPS Take 450 mg by mouth daily.     [provider]  fluticasone (FLONASE) 50 MCG/ACT nasal spray Place 1 spray into both nostrils 2 (two) times daily as needed for allergies.  09/26/16   [provider]  furosemide (LASIX) 20 MG tablet Take 20 mg by mouth daily as needed (leg and ankle swelling).     [provider]  insulin lispro (HUMALOG KWIKPEN) 100 UNIT/ML KiwkPen Inject 6-10 Units into the skin 3 (three) times daily with meals.    [provider]  IRON PO Take 1 tablet by mouth daily.    [provider]  losartan (COZAAR) 100 MG tablet Take 100 mg by mouth daily. 03/29/18   [provider]  meclizine (ANTIVERT) 25 MG tablet Take 12.5-25 mg by mouth 3 (three) times daily as  needed for dizziness.    [provider]  Multiple Vitamin (MULTIVITAMIN WITH MINERALS) TABS tablet Take 1 tablet by mouth daily.    [provider]  nitroGLYCERIN (NITROSTAT) 0.4 MG SL tablet Place 1  tablet (0.4 mg total) under the tongue every 5 (five) minutes as needed for chest pain. 12/07/15   Jettie Booze, MD  pantoprazole (PROTONIX) 40 MG tablet Take 40 mg by mouth daily as needed (acid reflux/heartburn).     [provider]  pregabalin (LYRICA) 300 MG capsule Take 1 capsule (300 mg total) by mouth 2 (two) times daily. 07/26/12   Dohmeier, Asencion Partridge, MD  rivaroxaban (XARELTO) 20 MG TABS tablet Take 20 mg by mouth daily.     [provider]  rosuvastatin (CRESTOR) 10 MG tablet Take 10 mg by mouth at bedtime.     [provider]  tamsulosin (FLOMAX) 0.4 MG CAPS capsule Take 0.4 mg by mouth daily. 05/09/16   [provider]  TRESIBA FLEXTOUCH 100 UNIT/ML SOPN FlexTouch Pen Inject 20 Units into the skin every morning. 05/11/18   [provider]  valACYclovir (VALTREX) 500 MG tablet Take 500 mg by mouth daily.    [provider]    Allergies    Other and Phenergan [promethazine]  Review of Systems   Review of Systems  Constitutional: Positive for fatigue. Negative for chills and fever.  Respiratory: Negative for cough and shortness of breath.   Cardiovascular: Negative for chest pain and palpitations.  Gastrointestinal: Negative for abdominal pain, nausea and vomiting.  Genitourinary: Negative for dysuria and hematuria.  Musculoskeletal: Positive for arthralgias, back pain and myalgias.  Skin: Negative for pallor and rash.  Neurological: Positive for light-headedness. Negative for seizures and syncope.  Psychiatric/Behavioral: Negative for agitation and confusion.  All other systems reviewed and are negative.   Physical Exam Updated Vital Signs BP 138/78   Pulse 61   Temp 98.4 F (36.9 C) (Oral)   Resp 16    Ht 5\' 5"  (1.651 m)   Wt 89.8 kg   SpO2 100%   BMI 32.95 kg/m   Physical Exam Vitals and nursing note reviewed.  Constitutional:      Appearance: He is well-developed.  HENT:     Head: Normocephalic and atraumatic.  Eyes:     Conjunctiva/sclera: Conjunctivae normal.  Cardiovascular:     Rate and Rhythm: Normal rate and regular rhythm.     Pulses: Normal pulses.  Pulmonary:     Effort: Pulmonary effort is normal. No respiratory distress.     Breath sounds: Normal breath sounds.  Abdominal:     General: There is distension.     Palpations: Abdomen is soft.     Tenderness: There is no abdominal tenderness. There is no guarding or rebound. Negative signs include Murphy's sign and McBurney's sign.     Comments: Abd distension chronic per patient  Musculoskeletal:     Cervical back: Neck supple.  Skin:    General: Skin is warm and dry.  Neurological:     General: No focal deficit present.     Mental Status: He is alert and oriented to person, place, and time.     Cranial Nerves: No cranial nerve deficit.     Gait: Gait normal.  Psychiatric:        Mood and Affect: Mood normal.        Behavior: Behavior normal.     ED Results / Procedures / Treatments   Labs (all labs ordered are listed, but only abnormal results are displayed) Labs Reviewed  COMPREHENSIVE METABOLIC PANEL - Abnormal; Notable for the following components:      Result Value   GFR calc non Af Amer 59 (*)  All other components within normal limits  SARS CORONAVIRUS 2 AG (30 MIN TAT)  CBC WITH DIFFERENTIAL/PLATELET  BRAIN NATRIURETIC PEPTIDE  URINALYSIS, ROUTINE W REFLEX MICROSCOPIC  TROPONIN I (HIGH SENSITIVITY)    EKG EKG Interpretation  Date/Time:  Sunday January 12 2019 12:55:46 EST Ventricular Rate:  62 PR Interval:  156 QRS Duration: 80 QT Interval:  388 QTC Calculation: 393 R Axis:   18 Text Interpretation: Normal sinus rhythm Low voltage QRS Borderline ECG No STEMI Confirmed by Octaviano Glow [phone]) on 01/12/2019 2:55:07 PM   Radiology DG Chest Portable 1 View  Result Date: 01/12/2019 CLINICAL DATA:  Shortness of breath. EXAM: PORTABLE CHEST 1 VIEW COMPARISON:  December 16, 2018. FINDINGS: Stable cardiomegaly. No pneumothorax or pleural effusion is noted. Both lungs are clear. The visualized skeletal structures are unremarkable. IMPRESSION: No active disease. Electronically Signed   By: Marijo Conception M.D.   On: 01/12/2019 15:35    Procedures Procedures (including critical care time)  Medications Ordered in ED Medications - No data to display  ED Course  I have reviewed the triage vital signs and the nursing notes.  Pertinent labs & imaging results that were available during my care of the patient were reviewed by me and considered in my medical decision making (see chart for details).  79 yo male w/ complicated medical hx including PE on xarelto, CKD, CAD, CHF, presenting to the ED with fatigue and back pain.    No red flags for cord compression or cauda equina His back pain he reports is chronic and bilateral in the lower back, reproducible on exam, and far less suggestive of aortic aneurysm or dissection to me   Vital signs unremarkable.  Bloodwork fairly unremarkable with normal BNP, CMP wnl, no anemia, WBC wnl. Low suspicion for bacterial infection, sepsis, ACS at this time Low suspicion for PE with no hypoxia or respiratory complaints.  Patient compliant with xarelto as well.  No signs or symptoms of stroke or TIA  Rapid covid negative Okay to discharge, f/u with his pcp  Clinical Course as of Jan 13 136  Sun Jan 12, 2019  1659 Patient has cardiology f/u on 1/12, advised him to keep appointment, with unremarkable w/u here okay for discharge.  I do not suspect a single troponin of 11 after multiple days of symptoms with a benign ecg is indicative of ACS at this time, but did advise that he keep his cardiology appointment for further evaluation   [MT]     Clinical Course User Index [MT] Chasidy Janak, Carola Rhine, MD    Final Clinical Impression(s) / ED Diagnoses Final diagnoses:  Lightheadedness    Rx / DC Orders ED Discharge Orders    None       Wyvonnia Dusky, MD 01/13/19 [phone]

## 2019-01-12 NOTE — ED Notes (Signed)
Pt on monitor 

## 2019-01-17 ENCOUNTER — Telehealth: Payer: Self-pay

## 2019-01-20 NOTE — Progress Notes (Signed)
Cardiology Office Note   Date:  01/22/2019   ID:  Steve Brown, DOB [birth year], MRN [id number]  PCP:  Steve Cruel, MD    No chief complaint on file.  CAD  Wt Readings from Last 3 Encounters:  01/22/19 202 lb 6.4 oz (91.8 kg)  01/12/19 198 lb (89.8 kg)  12/16/18 198 lb (89.8 kg)       History of Present Illness: Steve Brown is a 79 y.o. male  With a h/o CAD. He has an LAD DES and moderate circumflexdisease. Per the record: He has a "past medical history significant for CAD s/p LAD stent 2010, bilateral PE 07/2012 on Xarelto, diabetes type 2, prostate cancer diagnosed in 2018- treated with radiation, OSA on CPAP, hypertension, hyperlipidemia, history of TIAs and stroke, CKD stage II. Steve Brown had a stent to the LAD in 2010, 2012 cath showed patent stent and no significant CAD, cardiac cath in 2015showed moderate disease which was negative by FFR. His most recent echocardiogram in 04/2016 showed normal LV systolic function with EF 60-65 percent, normal wall motion, grade 2 diastolic dysfunction, mild AR, mildly dilated aortic root 39 mm, mild MR and increased thickness of the septum consistent with lipomatous hypertrophy."  Last cath in 2019 showed:  "Acute Mrg lesion is 25% stenosed.  Mid RCA lesion is 25% stenosed.  2nd Mrg lesion is 40% stenosed.  Mid Cx lesion is 40% stenosed.  Previously placed Mid LAD stent (unknown type) is widely patent.  Prox LAD-1 lesion is 25% stenosed.  Prox LAD-2 lesion is 25% stenosed.  The left ventricular systolic function is normal.  LV end diastolic pressure is normal.  The left ventricular ejection fraction is 55-65% by visual estimate.  There is no aortic valve stenosis.  Patent stent. Nonobstructive disease. Would investigate noncardiac causes of fatigue."  He had a TIA affecting the left side of his body in 9/19.  MRI was OK.   In Jan 2020,  He reported: " Dizziness: He has seen ENT before, but he  did not want to consider surgery.  I discussed sending him to Dr. Benjamine Mola for rehab options. He is agreeable. "  Since the last visit, he decreased exercise and gained some weight.   Denies : Chest pain.  Leg edema. Nitroglycerin use. Orthopnea. Palpitations. Paroxysmal nocturnal dyspnea. Shortness of breath. Syncope.    Past Medical History:  Diagnosis Date  . Abnormal prostate biopsy   . Anticoagulant long-term use    currently xarelto  . BPH with elevated PSA   . CKD (chronic kidney disease), stage II   . Complication of anesthesia    limted neck rom limited use of left arm due to cva  . Coronary artery disease    CARDIOLOGIST-  DR Irish Lack--  2010-- PCI w/ stenting midLAD  . DDD (degenerative disc disease), lumbar   . Degeneration of cervical intervertebral disc   . Depression   . Dyspnea on exertion   . GERD (gastroesophageal reflux disease)   . Hemiparesis due to cerebral infarction   . History of cerebrovascular accident (CVA) with residual deficit 2002 and 2003--  hemiparisis both sides   per MRI  anterior left frontal lobe, left para midline pons, and inferior cerebullam bilaterally infarcts  . History of pulmonary embolus (PE)    06-30-2012  extensive bilaterally  . History of recurrent TIAs   . History of syncope    hx multiple pre-syncope and syncopal episodes due to vasovagal, orthostatic hypotension, dehydration  .  History of TIAs    several since 2002  . Hyperlipidemia   . Hypertension   . Mild atherosclerosis of carotid artery, bilateral    per last duplex 11-04-2014  bilateral ICA 1--39%  . Neuropathy    fingers  . OSA on CPAP    followed by dr dohmeier--  sev. osa w/ AHI 65.9  . Prostate cancer ([name]) dx 2018  . Renal insufficiency   . S/P coronary artery stent placement 2010   stenting to mid LAD  . Simple renal cyst    bilaterally  . Stroke ([place])   . Trigger finger of both hands 11-17-13  . Type 2 diabetes mellitus ([name]) dx 1986   last one A1c 9.2 on  04-26-2016  . Unsteady gait    . Hx prior CVA/TIAs;  . Vertebral artery occlusion, left    chronic    Past Surgical History:  Procedure Laterality Date  . ANTERIOR CERVICAL DECOMP/DISCECTOMY FUSION  2004   C3 -- C6 limited rom  . CARDIAC CATHETERIZATION  06-10-2010   dr Irish Lack   wide patent LAD stent, mid lesion at the origin of the septal prior to the previous stent 40-50%/  normal LVF, ef 55%  . CARDIOVASCULAR STRESS TEST  10-23-2012  dr Irish Lack   normal nuclear perfusion study w/ no ischemia/  normal LV function and wall motion , ef 65%  . CARPAL TUNNEL RELEASE Bilateral   . CATARACT EXTRACTION W/ INTRAOCULAR LENS  IMPLANT, BILATERAL    . CHOLECYSTECTOMY N/A 11/02/2015   Procedure: LAPAROSCOPIC CHOLECYSTECTOMY WITH INTRAOPERATIVE CHOLANGIOGRAM;  Surgeon: Donnie Mesa, MD;  Location: [facility];  Service: General;  Laterality: N/A;  . COLONOSCOPY    . CORONARY ANGIOPLASTY WITH STENT PLACEMENT  02/2008   stenting to mid LAD  . GOLD SEED IMPLANT N/A 11/15/2016   Procedure: GOLD SEED IMPLANT TIMES THREE;  Surgeon: Ardis Hughs, MD;  Location: [facility];  Service: Urology;  Laterality: N/A;  . IR ANGIO INTRA EXTRACRAN SEL COM CAROTID INNOMINATE BILAT MOD SED  06/13/2018  . IR ANGIO VERTEBRAL SEL VERTEBRAL UNI R MOD SED  06/13/2018  . IR US GUIDE VASC ACCESS RIGHT  06/13/2018  . LEFT HEART CATH AND CORONARY ANGIOGRAPHY N/A 05/25/2017   Procedure: LEFT HEART CATH AND CORONARY ANGIOGRAPHY;  Surgeon: Jettie Booze, MD;  Location: [facility];  Service: Cardiovascular;  Laterality: N/A;  . LEFT HEART CATHETERIZATION WITH CORONARY ANGIOGRAM N/A 04/03/2013   Procedure: LEFT HEART CATHETERIZATION WITH CORONARY ANGIOGRAM;  Surgeon: Jettie Booze, MD;  Location: [facility] CATH [facility];  Service: Cardiovascular;  Laterality: N/A;  patent mLAD stent  w/ mild disease in remainder LAD and its branches;  mod. focal lesion midLCFx- FFR of lesion was negative for ischemia/  normal  LVSF, ef 50%  . lungs  2005   "fluid pumped off lungs"  . NEUROPLASTY / TRANSPOSITION ULNAR NERVE AT ELBOW Right 2004  . PROSTATE BIOPSY N/A 08/31/2016   Procedure: PROSTATE  BIOPSY TRANSRECTAL ULTRASONIC PROSTATE (TUBP);  Surgeon: Ardis Hughs, MD;  Location: [facility];  Service: Urology;  Laterality: N/A;  . SPACE OAR INSTILLATION N/A 11/15/2016   Procedure: SPACE OAR INSTILLATION;  Surgeon: Ardis Hughs, MD;  Location: [facility];  Service: Urology;  Laterality: N/A;  . TRANSTHORACIC ECHOCARDIOGRAM  04/27/2016   severe focal basal LVH, ef 60-65%,  grade 2 diastoilc dysfunction/  mild AR, MR, and TR/  atrial septum lipomatous hypertrophy/  PASP 47mmHg  .  UMBILICAL HERNIA REPAIR       Current Outpatient Medications  Medication Sig Dispense Refill  . acetaminophen (TYLENOL) 500 MG tablet Take 1,000 mg by mouth every 6 (six) hours as needed (pain).     . Ascorbic Acid (VITAMIN C PO) Take 1 tablet by mouth daily.    . bisacodyl (DULCOLAX) 5 MG EC tablet Take 5 mg by mouth daily as needed for moderate constipation.    . cholecalciferol (VITAMIN D) 1000 UNITS tablet Take 1,000 Units by mouth daily.    . diclofenac sodium (VOLTAREN) 1 % GEL Apply 1 application topically See admin instructions. Apply topically once daily , may use an additional time as needed for pain  1  . Echinacea 450 MG CAPS Take 450 mg by mouth daily.     . fluticasone (FLONASE) 50 MCG/ACT nasal spray Place 1 spray into both nostrils 2 (two) times daily as needed for allergies.   0  . furosemide (LASIX) 20 MG tablet Take 20 mg by mouth daily as needed (leg and ankle swelling).     . insulin lispro (HUMALOG KWIKPEN) 100 UNIT/ML KiwkPen Inject 6-10 Units into the skin 3 (three) times daily with meals.    . IRON PO Take 1 tablet by mouth daily.    Marland Kitchen losartan (COZAAR) 100 MG tablet Take 100 mg by mouth daily.    . meclizine (ANTIVERT) 25 MG tablet Take 12.5-25 mg by mouth 3 (three)  times daily as needed for dizziness.    . Multiple Vitamin (MULTIVITAMIN WITH MINERALS) TABS tablet Take 1 tablet by mouth daily.    . nitroGLYCERIN (NITROSTAT) 0.4 MG SL tablet Place 1 tablet (0.4 mg total) under the tongue every 5 (five) minutes as needed for chest pain. 25 tablet 12  . pantoprazole (PROTONIX) 40 MG tablet Take 40 mg by mouth daily as needed (acid reflux/heartburn).     . pregabalin (LYRICA) 300 MG capsule Take 1 capsule (300 mg total) by mouth 2 (two) times daily. 180 capsule 3  . rivaroxaban (XARELTO) 20 MG TABS tablet Take 20 mg by mouth daily.     . rosuvastatin (CRESTOR) 10 MG tablet Take 10 mg by mouth at bedtime.     . tamsulosin (FLOMAX) 0.4 MG CAPS capsule Take 0.4 mg by mouth daily.    Tyler Aas FLEXTOUCH 100 UNIT/ML SOPN FlexTouch Pen Inject 20 Units into the skin every morning.    . valACYclovir (VALTREX) 500 MG tablet Take 500 mg by mouth daily.    Marland Kitchen zinc gluconate 50 MG tablet Take 50 mg by mouth daily.     No current facility-administered medications for this visit.    Allergies:   Other and Phenergan [promethazine]    Social History:  The patient  reports that he has never smoked. He has never used smokeless tobacco. He reports that he does not drink alcohol or use drugs.   Family History:  The patient's family history includes Aneurysm in his mother; Cancer in his father; Dementia in his sister; Stroke in his brother.    ROS:  Please see the history of present illness.   Otherwise, review of systems are positive for occasional lightheadedness .   All other systems are reviewed and negative.    PHYSICAL EXAM: VS:  BP 136/76   Pulse 68   Ht 5\' 5"  (1.651 m)   Wt 202 lb 6.4 oz (91.8 kg)   SpO2 97%   BMI 33.68 kg/m  , BMI Body mass index is 33.68  kg/m. GEN: Well nourished, well developed, in no acute distress  HEENT: normal  Neck: no JVD, carotid bruits, or masses Cardiac: RRR; no murmurs, rubs, or gallops,no edema  Respiratory:  clear to  auscultation bilaterally, normal work of breathing GI: soft, nontender, nondistended, + BS MS: no deformity or atrophy  Skin: warm and dry, no rash Neuro:  Strength and sensation are intact Psych: euthymic mood, full affect   EKG:   The ekg ordered today demonstrates NSR, no ST changes   Recent Labs: 01/12/2019: ALT 25; B Natriuretic Peptide 53.7; BUN 15; Creatinine, Ser 1.19; Hemoglobin 13.8; Platelets 166; Potassium 3.9; Sodium 140   Lipid Panel    Component Value Date/Time   CHOL 144 03/21/2013 0400   TRIG 122 03/21/2013 0400   HDL 57 03/21/2013 0400   CHOLHDL 2.5 03/21/2013 0400   VLDL 24 03/21/2013 0400   LDLCALC 63 03/21/2013 0400     Other studies Reviewed: Additional studies/ records that were reviewed today with results demonstrating: labs reviewed, ER records reviewed   ASSESSMENT AND PLAN:  1. CAD: No angina.  Continue aggressive secondary prevention. Weight gain with COVID due to less exercise. Diet has been difficult to maintain when home alone. Would have Dr. Harrington Challenger check lipids.   2. DM: A1C 8.0 in 11/2018. Increase exercise.  Whole food plant based diet recommended. 3. HTN: The current medical regimen is effective;  continue present plan and medications. 4. OSA: Using CPAP.  5. Lightheadedness: seeing Dr. Benjamine Mola. Stay hydrated.  6. Anticoagulated: Tolerating Xarelto given bilateral PE in 2014.  7. COVID vaccine recommended.  Phone number to schedule appt given.   8. Heart healthy diet info given.    Current medicines are reviewed at length with the patient today.  The patient concerns regarding his medicines were addressed.  The following changes have been made:  No change  Labs/ tests ordered today include:  No orders of the defined types were placed in this encounter.   Recommend 150 minutes/week of aerobic exercise Low fat, low carb, high fiber diet recommended  Disposition:   FU in 1 year   Signed, Larae Grooms, MD  01/22/2019 10:37 AM      [facility] [place], [place], [place]  [address] Phone: [phone]; Fax: [phone]

## 2019-01-22 ENCOUNTER — Encounter: Payer: Self-pay | Admitting: Interventional Cardiology

## 2019-01-22 ENCOUNTER — Ambulatory Visit (INDEPENDENT_AMBULATORY_CARE_PROVIDER_SITE_OTHER): Payer: Medicare Other | Admitting: Interventional Cardiology

## 2019-01-22 ENCOUNTER — Other Ambulatory Visit: Payer: Self-pay

## 2019-01-22 VITALS — BP 136/76 | HR 68 | Ht 65.0 in | Wt 202.4 lb

## 2019-01-22 DIAGNOSIS — I10 Essential (primary) hypertension: Secondary | ICD-10-CM

## 2019-01-22 DIAGNOSIS — I25118 Atherosclerotic heart disease of native coronary artery with other forms of angina pectoris: Secondary | ICD-10-CM

## 2019-01-22 DIAGNOSIS — G4733 Obstructive sleep apnea (adult) (pediatric): Secondary | ICD-10-CM

## 2019-01-22 DIAGNOSIS — E1159 Type 2 diabetes mellitus with other circulatory complications: Secondary | ICD-10-CM

## 2019-01-22 DIAGNOSIS — Z7901 Long term (current) use of anticoagulants: Secondary | ICD-10-CM

## 2019-01-22 NOTE — Patient Instructions (Signed)
Medication Instructions:  Your physician recommends that you continue on your current medications as directed. Please refer to the Current Medication list given to you today.  *If you need a refill on your cardiac medications before your next appointment, please call your pharmacy*  Lab Work: Please have cholesterol checked with your Primary Care Doctor  If you have labs (blood work) drawn today and your tests are completely normal, you will receive your results only by: Marland Kitchen MyChart Message (if you have MyChart) OR . A paper copy in the mail If you have any lab test that is abnormal or we need to change your treatment, we will call you to review the results.  Testing/Procedures: None ordered  Follow-Up: At [facility], you and your health needs are our priority.  As part of our continuing mission to provide you with exceptional heart care, we have created designated Provider Care Teams.  These Care Teams include your primary Cardiologist (physician) and Advanced Practice Providers (APPs -  Physician Assistants and Nurse Practitioners) who all work together to provide you with the care you need, when you need it.  Your next appointment:   12 month(s)  The format for your next appointment:   In Person  Provider:   You may see Larae Grooms, MD or one of the following Advanced Practice Providers on your designated Care Team:    Melina Copa, PA-C  Ermalinda Barrios, PA-C   Other Instructions We are recommending the COVID-19 vaccine to all of our patients. Cardiac medications (including blood thinners) should not deter anyone from being vaccinated and there is no need to hold any of those medications prior to vaccine administration.   Currently, there is a hotline to call (active 01/17/19) to schedule vaccination appointments as no walk-ins will be accepted.    Vaccines through the health department can be arranged by calling [phone]    Vaccines through [facility] can be arranged by  calling [phone] or visiting [email or web address]   If you have further questions or concerns about the vaccine process, please visit [email or web address], [email or web address], or contact your primary care physician.

## 2019-01-23 DIAGNOSIS — M25812 Other specified joint disorders, left shoulder: Secondary | ICD-10-CM | POA: Diagnosis not present

## 2019-01-28 DIAGNOSIS — I69354 Hemiplegia and hemiparesis following cerebral infarction affecting left non-dominant side: Secondary | ICD-10-CM | POA: Diagnosis not present

## 2019-01-28 DIAGNOSIS — E782 Mixed hyperlipidemia: Secondary | ICD-10-CM | POA: Diagnosis not present

## 2019-01-28 DIAGNOSIS — I679 Cerebrovascular disease, unspecified: Secondary | ICD-10-CM | POA: Diagnosis not present

## 2019-01-28 DIAGNOSIS — Z79899 Other long term (current) drug therapy: Secondary | ICD-10-CM | POA: Diagnosis not present

## 2019-01-28 DIAGNOSIS — I10 Essential (primary) hypertension: Secondary | ICD-10-CM | POA: Diagnosis not present

## 2019-01-28 DIAGNOSIS — D649 Anemia, unspecified: Secondary | ICD-10-CM | POA: Diagnosis not present

## 2019-01-28 DIAGNOSIS — N400 Enlarged prostate without lower urinary tract symptoms: Secondary | ICD-10-CM | POA: Diagnosis not present

## 2019-01-28 DIAGNOSIS — I700 Atherosclerosis of aorta: Secondary | ICD-10-CM | POA: Diagnosis not present

## 2019-01-28 DIAGNOSIS — E559 Vitamin D deficiency, unspecified: Secondary | ICD-10-CM | POA: Diagnosis not present

## 2019-01-28 DIAGNOSIS — M791 Myalgia, unspecified site: Secondary | ICD-10-CM | POA: Diagnosis not present

## 2019-02-03 DIAGNOSIS — H0279 Other degenerative disorders of eyelid and periocular area: Secondary | ICD-10-CM | POA: Diagnosis not present

## 2019-02-03 DIAGNOSIS — H02423 Myogenic ptosis of bilateral eyelids: Secondary | ICD-10-CM | POA: Diagnosis not present

## 2019-02-03 DIAGNOSIS — H02831 Dermatochalasis of right upper eyelid: Secondary | ICD-10-CM | POA: Diagnosis not present

## 2019-02-03 DIAGNOSIS — H02834 Dermatochalasis of left upper eyelid: Secondary | ICD-10-CM | POA: Diagnosis not present

## 2019-02-03 DIAGNOSIS — H53483 Generalized contraction of visual field, bilateral: Secondary | ICD-10-CM | POA: Diagnosis not present

## 2019-02-03 DIAGNOSIS — H57813 Brow ptosis, bilateral: Secondary | ICD-10-CM | POA: Diagnosis not present

## 2019-02-03 DIAGNOSIS — H02413 Mechanical ptosis of bilateral eyelids: Secondary | ICD-10-CM | POA: Diagnosis not present

## 2019-02-04 ENCOUNTER — Other Ambulatory Visit: Payer: Self-pay | Admitting: *Deleted

## 2019-02-04 DIAGNOSIS — J310 Chronic rhinitis: Secondary | ICD-10-CM | POA: Diagnosis not present

## 2019-02-04 DIAGNOSIS — H6123 Impacted cerumen, bilateral: Secondary | ICD-10-CM | POA: Diagnosis not present

## 2019-02-04 DIAGNOSIS — R42 Dizziness and giddiness: Secondary | ICD-10-CM | POA: Diagnosis not present

## 2019-02-04 DIAGNOSIS — H838X3 Other specified diseases of inner ear, bilateral: Secondary | ICD-10-CM | POA: Diagnosis not present

## 2019-02-04 DIAGNOSIS — J343 Hypertrophy of nasal turbinates: Secondary | ICD-10-CM | POA: Diagnosis not present

## 2019-02-04 DIAGNOSIS — H903 Sensorineural hearing loss, bilateral: Secondary | ICD-10-CM | POA: Diagnosis not present

## 2019-02-04 DIAGNOSIS — J342 Deviated nasal septum: Secondary | ICD-10-CM | POA: Diagnosis not present

## 2019-02-04 NOTE — Patient Outreach (Signed)
Steve Brown) Care Management  02/04/2019  Steve Brown [birth year] [id number]   Unsuccessful outreach attempt made to patient.  RN Health Coach left HIPAA compliant voicemail message along with her contact information.  Plan: RN Health Coach will call patient within 2-3 business days.   Emelia Loron RN, BSN [place] [phone] [email or web address]

## 2019-02-05 ENCOUNTER — Ambulatory Visit: Payer: Self-pay | Admitting: *Deleted

## 2019-02-05 DIAGNOSIS — D649 Anemia, unspecified: Secondary | ICD-10-CM | POA: Diagnosis not present

## 2019-02-05 DIAGNOSIS — I69354 Hemiplegia and hemiparesis following cerebral infarction affecting left non-dominant side: Secondary | ICD-10-CM | POA: Diagnosis not present

## 2019-02-05 DIAGNOSIS — M791 Myalgia, unspecified site: Secondary | ICD-10-CM | POA: Diagnosis not present

## 2019-02-05 DIAGNOSIS — I679 Cerebrovascular disease, unspecified: Secondary | ICD-10-CM | POA: Diagnosis not present

## 2019-02-05 DIAGNOSIS — E559 Vitamin D deficiency, unspecified: Secondary | ICD-10-CM | POA: Diagnosis not present

## 2019-02-05 DIAGNOSIS — E782 Mixed hyperlipidemia: Secondary | ICD-10-CM | POA: Diagnosis not present

## 2019-02-05 DIAGNOSIS — I10 Essential (primary) hypertension: Secondary | ICD-10-CM | POA: Diagnosis not present

## 2019-02-05 DIAGNOSIS — N400 Enlarged prostate without lower urinary tract symptoms: Secondary | ICD-10-CM | POA: Diagnosis not present

## 2019-02-05 DIAGNOSIS — Z79899 Other long term (current) drug therapy: Secondary | ICD-10-CM | POA: Diagnosis not present

## 2019-02-05 DIAGNOSIS — I700 Atherosclerosis of aorta: Secondary | ICD-10-CM | POA: Diagnosis not present

## 2019-02-06 ENCOUNTER — Other Ambulatory Visit: Payer: Self-pay | Admitting: *Deleted

## 2019-02-06 DIAGNOSIS — R42 Dizziness and giddiness: Secondary | ICD-10-CM | POA: Diagnosis not present

## 2019-02-06 NOTE — Patient Outreach (Signed)
[facility]) Care Management  02/06/2019  Kmari Pell 1940-07-18 [id number]  Unsuccessful outreach attempt made to patient.  RN Health Coach left HIPAA compliant voicemail message along with her contact information.  Plan: RN Health Coach will call patient within 3-4 business days.  Emelia Loron RN, BSN [place] [phone] [email or web address]

## 2019-02-10 ENCOUNTER — Other Ambulatory Visit: Payer: Self-pay | Admitting: *Deleted

## 2019-02-10 ENCOUNTER — Encounter: Payer: Self-pay | Admitting: *Deleted

## 2019-02-10 NOTE — Patient Outreach (Signed)
[facility]) Care Management  02/10/2019  Steve Brown [birth year] [id number]   Third unsuccessful outreach attempt made to patient for screening.  RN Health Coach left HIPAA compliant voicemail message along with her contact information.  Assessment:  Multiple attempts to establish contact with patient without success  Plan: [place] will close within 10 business days if no return call from patient.   Emelia Loron RN, BSN [name] [phone] [email or web address]

## 2019-02-10 NOTE — Patient Outreach (Signed)
[facility]) Care Management  02/10/2019   Thad Derick 17-Jan-1940 [id number]  RN Health Coach Initial Assessment  Outreach Attempt: Successful initial telephone outreach call to patient. HIPAA identifiers obtained.  Social: Patient lives alone. He is independent with all ADLs , IADLs, and currently transports himself to all of his medical appointments. Patient denies having any recent falls and reports that his home environment is safe. His daughter Jonelle Sidle would be able to provide care for the patient if the patient were to need her assistance and emotionally the Mr. Mousel is doing well at this time.  Conditions:  Past medical history includes but is not limited to: prostate cancer, chronic kidney disease stage II, hemiparesis due to cerebral infarct, depression, coronary artery disease, type II diabetes, hypertension, hyperlipidemia, pulmonary embolism, sleep apnea. Patient reports that his FBS today was 177. He takes his blood sugar in the morning for fasting result and before each meal as he has a sliding scale to go by for his meal insulin doses. His glucose ranges are from 70- the low 200's Patient reports that he does walk for physical activity and he tries to adhere to diabetic and low salt diet. He does report that he was seen recently in the ED for lightheadedness and was released to be followed closely by cardiologist of who he saw 01/22/19. Patient states that he feels somewhat better and will follow-up with ENT specialist to evaluate as well. Mr. Fullard states that he has not been exercising like he did previously due to covid and being at home alone it is difficult to closely follow his diabetic diet.   Medications: Patient takes 20 different medications and supplements daily of which he manages. He denies any problematic side effects and he does not have any questions or concerns regarding his medications at this time. Patient reports that he can afford his  medications.  Appointments: Last PCP visit was 01/28/19 and patient reports he has another scheduled PCP visit early this month. He had a recent cardiology visit with [name] on 01/22/19. He is followed by Dr. Chalmers Cater who he last saw on 12/02/18.  Advanced Directives: Patient is unsure if he has Advance Directives completed. Nurse did discuss the directives and will sent the patient documents.   Consent: [facility] services reviewed and discussed with patient and patient verbally consents for services and disease management outreaches.   Current Medications:  Current Outpatient Medications  Medication Sig Dispense Refill  . acetaminophen (TYLENOL) 500 MG tablet Take 1,000 mg by mouth every 6 (six) hours as needed (pain).     . Ascorbic Acid (VITAMIN C PO) Take 1 tablet by mouth daily.    . bisacodyl (DULCOLAX) 5 MG EC tablet Take 5 mg by mouth daily as needed for moderate constipation.    . cholecalciferol (VITAMIN D) 1000 UNITS tablet Take 1,000 Units by mouth daily.    . diclofenac sodium (VOLTAREN) 1 % GEL Apply 1 application topically See admin instructions. Apply topically once daily , may use an additional time as needed for pain  1  . Echinacea 450 MG CAPS Take 450 mg by mouth daily.     . fluticasone (FLONASE) 50 MCG/ACT nasal spray Place 1 spray into both nostrils 2 (two) times daily as needed for allergies.   0  . furosemide (LASIX) 20 MG tablet Take 20 mg by mouth daily as needed (leg and ankle swelling).     . insulin lispro (HUMALOG KWIKPEN) 100 UNIT/ML KiwkPen Inject 6-10 Units  into the skin 3 (three) times daily with meals.    . IRON PO Take 1 tablet by mouth daily.    Marland Kitchen losartan (COZAAR) 100 MG tablet Take 100 mg by mouth daily.    . meclizine (ANTIVERT) 25 MG tablet Take 12.5-25 mg by mouth 3 (three) times daily as needed for dizziness.    . Multiple Vitamin (MULTIVITAMIN WITH MINERALS) TABS tablet Take 1 tablet by mouth daily.    . nitroGLYCERIN (NITROSTAT) 0.4 MG SL tablet Place  1 tablet (0.4 mg total) under the tongue every 5 (five) minutes as needed for chest pain. 25 tablet 12  . pantoprazole (PROTONIX) 40 MG tablet Take 40 mg by mouth daily as needed (acid reflux/heartburn).     . pregabalin (LYRICA) 300 MG capsule Take 1 capsule (300 mg total) by mouth 2 (two) times daily. 180 capsule 3  . rivaroxaban (XARELTO) 20 MG TABS tablet Take 20 mg by mouth daily.     . rosuvastatin (CRESTOR) 10 MG tablet Take 10 mg by mouth at bedtime.     . tamsulosin (FLOMAX) 0.4 MG CAPS capsule Take 0.4 mg by mouth daily.    Tyler Aas FLEXTOUCH 100 UNIT/ML SOPN FlexTouch Pen Inject 20 Units into the skin every morning.    . valACYclovir (VALTREX) 500 MG tablet Take 500 mg by mouth daily.    Marland Kitchen zinc gluconate 50 MG tablet Take 50 mg by mouth daily.     No current facility-administered medications for this visit.    Functional Status:  In your present state of health, do you have any difficulty performing the following activities: 02/10/2019 06/13/2018  Hearing? N N  Vision? N N  Difficulty concentrating or making decisions? N N  Walking or climbing stairs? N N  Dressing or bathing? N N  Doing errands, shopping? N -  Preparing Food and eating ? N -  Using the Toilet? N -  In the past six months, have you accidently leaked urine? N -  Do you have problems with loss of bowel control? N -  Managing your Medications? N -  Managing your Finances? N -  Housekeeping or managing your Housekeeping? N -  Some recent data might be hidden    Fall/Depression Screening: Fall Risk  02/10/2019 03/20/2017 09/25/2016  Falls in the past year? 0 No No  Number falls in past yr: 0 - -  Injury with Fall? 0 - -  Risk for fall due to : Other (Comment) - -  Risk for fall due to: Comment has been experiencing lightheadedness - -  Follow up Falls prevention discussed;Education provided;Falls evaluation completed - -   PHQ 2/9 Scores 02/10/2019 02/10/2019 03/20/2017 09/25/2016 05/19/2013  PHQ - 2 Score 1 1 0 0 0    THN CM Care Plan Problem One     Most Recent Value  Care Plan Problem One  Knowledge Deficit of diabetes management related to wide ranges of glucose values and diet adherence  Role Documenting the Problem One  [place] for Problem One  Active  THN Long Term Goal   Patient will identify diet behaviors that increases his FBS > 200 within the next 90 days  THN Long Term Goal Start Date  02/10/19  Interventions for Problem One Long Term Goal  RN discussed importance of monitoring sugar and counting carbohydrates, encouraged patient to continue with daily FBS and premeal CBG monitoring and record data, encouraged medication adherence, discussed reflecting back to diet when glucose  values are high, sent Diabetes information packet, sent education about counting carbohydrates, discuss weight management, exercise and how it relates to diabetes, sent weight loss tips education.  THN CM Short Term Goal #1   Patient will discuss the difference between simple carbohydrates and complex carbohydrates within the next 30 days  THN CM Short Term Goal #1 Start Date  02/10/19  Interventions for Short Term Goal #1  RN will send education regarding diabetes and carbohydrates, RN discussed monitoring carbohydrates, discussed reflecting back to what patient ate previously when glucose values are high.      Plan:  RN Health Coach sent [place] sent  Health Coach letter RN Health Coach sent [place] letter and assessment to PCP RN Health Coach sent Diabetes information Packet RN Health Coach sent EMMI education carbohydrate counting  RN Health Coach sent EMMI education weight loss tips RN Health Coach sent Advanced Directive document RN Health Coach will call patient within the month of March and patient agrees to future outreach calls.   Emelia Loron RN, BSN [place] [phone] [email or web address]

## 2019-02-13 DIAGNOSIS — I739 Peripheral vascular disease, unspecified: Secondary | ICD-10-CM | POA: Diagnosis not present

## 2019-02-13 DIAGNOSIS — E1051 Type 1 diabetes mellitus with diabetic peripheral angiopathy without gangrene: Secondary | ICD-10-CM | POA: Diagnosis not present

## 2019-02-13 DIAGNOSIS — L603 Nail dystrophy: Secondary | ICD-10-CM | POA: Diagnosis not present

## 2019-02-19 DIAGNOSIS — Z1152 Encounter for screening for COVID-19: Secondary | ICD-10-CM | POA: Diagnosis not present

## 2019-02-19 DIAGNOSIS — B349 Viral infection, unspecified: Secondary | ICD-10-CM | POA: Diagnosis not present

## 2019-02-20 DIAGNOSIS — Z1152 Encounter for screening for COVID-19: Secondary | ICD-10-CM | POA: Diagnosis not present

## 2019-02-20 DIAGNOSIS — B349 Viral infection, unspecified: Secondary | ICD-10-CM | POA: Diagnosis not present

## 2019-02-27 DIAGNOSIS — M25812 Other specified joint disorders, left shoulder: Secondary | ICD-10-CM | POA: Diagnosis not present

## 2019-03-04 ENCOUNTER — Other Ambulatory Visit: Payer: Self-pay | Admitting: *Deleted

## 2019-03-04 NOTE — Patient Outreach (Signed)
[facility]) Care Management  03/04/2019   Steve Brown November 11, 1940 [id number]  Successful telephone outreach call to patient. HIPAA identifiers obtained. RN called patient today after he left a voice message for nurse to contact him. Patient explained that yesterday he felt inspired to change his health habits to improve his overall health and diabetes. He called nurse to get guidance regarding diet and nutrition of which would help him to both lose weight and improve diabetes. After patient called he did remember that nurse sent him a diabetes packet and information regarding weight loss tips of which he has started to review. Patient explained that the information is beneficial and that he will continue to review over the content and will reach out to nurse if he has any questions or concerns. Patient stated that his fasting blood sugar was 154 this morning and it usually ranges between 100-200. His pre-meal blood sugar varies depending on how well he has eaten. Pre-meal blood sugars are 80-150 when eating well and 180-200 when he has not controlled his diabetic diet well. Patient discussed that the pandemic has had an impact on his dietary  and exercise habits but he is wanting to change this for himself and for his grandson who has recently been diagnosed with prediabetes at a young age. Nurse did provide patient with phone number to [facility] for him to inquire about programs for him and his young grandson.  Steve Brown is reflecting back to what he has eaten that he feels has elevated his blood sugar. He is committed to limited these items or removing them from his cabinet. His physical activity has decreased as he has not been able to go to the [facility] Now that he has received both covid vaccinations, nurse suggested that he call his PCP and cardiologist to see if they feel it would be safe for him to return if he is very cautious. Patient  states that he will contact physicians. He states he does walk outside for about 30 minutes 3 times weekly. Patient and nurse agreed that Steve Brown would continue to review over the information that was sent to him earlier this month and that the nurse would follow up with him in March to reevaluate.   Current Medications:  Current Outpatient Medications  Medication Sig Dispense Refill  . acetaminophen (TYLENOL) 500 MG tablet Take 1,000 mg by mouth every 6 (six) hours as needed (pain).     . Ascorbic Acid (VITAMIN C PO) Take 1 tablet by mouth daily.    . bisacodyl (DULCOLAX) 5 MG EC tablet Take 5 mg by mouth daily as needed for moderate constipation.    . cholecalciferol (VITAMIN D) 1000 UNITS tablet Take 1,000 Units by mouth daily.    . diclofenac sodium (VOLTAREN) 1 % GEL Apply 1 application topically See admin instructions. Apply topically once daily , may use an additional time as needed for pain  1  . Echinacea 450 MG CAPS Take 450 mg by mouth daily.     . fluticasone (FLONASE) 50 MCG/ACT nasal spray Place 1 spray into both nostrils 2 (two) times daily as needed for allergies.   0  . furosemide (LASIX) 20 MG tablet Take 20 mg by mouth daily as needed (leg and ankle swelling).     . insulin lispro (HUMALOG KWIKPEN) 100 UNIT/ML KiwkPen Inject 6-10 Units into the skin 3 (three) times daily with meals.    . IRON PO Take 1 tablet  by mouth daily.    Marland Kitchen losartan (COZAAR) 100 MG tablet Take 100 mg by mouth daily.    . meclizine (ANTIVERT) 25 MG tablet Take 12.5-25 mg by mouth 3 (three) times daily as needed for dizziness.    . Multiple Vitamin (MULTIVITAMIN WITH MINERALS) TABS tablet Take 1 tablet by mouth daily.    . nitroGLYCERIN (NITROSTAT) 0.4 MG SL tablet Place 1 tablet (0.4 mg total) under the tongue every 5 (five) minutes as needed for chest pain. 25 tablet 12  . pantoprazole (PROTONIX) 40 MG tablet Take 40 mg by mouth daily as needed (acid reflux/heartburn).     . pregabalin (LYRICA) 300 MG  capsule Take 1 capsule (300 mg total) by mouth 2 (two) times daily. 180 capsule 3  . rivaroxaban (XARELTO) 20 MG TABS tablet Take 20 mg by mouth daily.     . rosuvastatin (CRESTOR) 10 MG tablet Take 10 mg by mouth at bedtime.     . tamsulosin (FLOMAX) 0.4 MG CAPS capsule Take 0.4 mg by mouth daily.    Tyler Aas FLEXTOUCH 100 UNIT/ML SOPN FlexTouch Pen Inject 20 Units into the skin every morning.    . valACYclovir (VALTREX) 500 MG tablet Take 500 mg by mouth daily.    Marland Kitchen zinc gluconate 50 MG tablet Take 50 mg by mouth daily.     No current facility-administered medications for this visit.    Functional Status:  In your present state of health, do you have any difficulty performing the following activities: 03/04/2019 02/10/2019  Hearing? N N  Vision? N N  Difficulty concentrating or making decisions? N N  Walking or climbing stairs? N N  Dressing or bathing? N N  Doing errands, shopping? N N  Preparing Food and eating ? N N  Using the Toilet? N N  In the past six months, have you accidently leaked urine? N N  Do you have problems with loss of bowel control? N N  Managing your Medications? N N  Managing your Finances? N N  Housekeeping or managing your Housekeeping? N N  Some recent data might be hidden    Fall/Depression Screening: Fall Risk  03/04/2019 02/10/2019 03/20/2017  Falls in the past year? 0 0 No  Number falls in past yr: 0 0 -  Injury with Fall? 0 0 -  Risk for fall due to : Other (Comment);Impaired mobility Other (Comment) -  Risk for fall due to: Comment reports lightheadedness at times has been experiencing lightheadedness -  Follow up Falls prevention discussed;Education provided;Falls evaluation completed Falls prevention discussed;Education provided;Falls evaluation completed -   PHQ 2/9 Scores 02/10/2019 02/10/2019 03/20/2017 09/25/2016 05/19/2013  PHQ - 2 Score 1 1 0 0 0   THN CM Care Plan Problem One     Most Recent Value  Care Plan Problem One  Knowledge Deficit of  diabetes management related to wide ranges of glucose values and diet adherence  Role Documenting the Problem One  [place] for Problem One  Active  THN Long Term Goal   Active Patient will identify diet behaviors that increases his FBS > 200 within the next 90 days  Interventions for Problem One Long Term Goal  RN encouraged patient to contiue to review infomation in the diabetes packet that was sent to him, discussed food items that patient feels is keeping blood sugar elevated and patient states he will remove them from his cabinet, gave patient resource informaiton regarding [place], dicussed  using portion control, patient states he will contact PCP and cardiologist to see if they feel it is safe for him to go back to the [facility] given that he has had the covid vaccines, patient states he will continue to walk outside for about 30 minutes 3 times weekly, patient will make note of the food items he eats that is elevating his blood sugar.  THN CM Short Term Goal #1   Patient will discuss the difference between simple carbohydrates and complex carbohydrates within the next 30 days  Interventions for Short Term Goal #1  RN encouraged patient to review the nutrition and carbohydrate information of the diabetic packet that was sent to him, nurse discussed using portion control when serving carbohydrates, discussed adding proteins to each meal to balance carbohydrates ingested     Plan:  Amorita will call patient within the month of March and patient agrees to future outreach calls.   Emelia Loron RN, BSN [place] [phone] [email or web address]

## 2019-03-06 DIAGNOSIS — M25812 Other specified joint disorders, left shoulder: Secondary | ICD-10-CM | POA: Diagnosis not present

## 2019-03-11 ENCOUNTER — Ambulatory Visit: Payer: Medicare Other | Admitting: *Deleted

## 2019-03-12 DIAGNOSIS — E782 Mixed hyperlipidemia: Secondary | ICD-10-CM | POA: Diagnosis not present

## 2019-03-12 DIAGNOSIS — R748 Abnormal levels of other serum enzymes: Secondary | ICD-10-CM | POA: Diagnosis not present

## 2019-03-12 DIAGNOSIS — N183 Chronic kidney disease, stage 3 unspecified: Secondary | ICD-10-CM | POA: Diagnosis not present

## 2019-03-20 ENCOUNTER — Other Ambulatory Visit: Payer: Self-pay

## 2019-03-20 ENCOUNTER — Emergency Department (HOSPITAL_BASED_OUTPATIENT_CLINIC_OR_DEPARTMENT_OTHER): Payer: Medicare Other

## 2019-03-20 ENCOUNTER — Encounter (HOSPITAL_BASED_OUTPATIENT_CLINIC_OR_DEPARTMENT_OTHER): Payer: Self-pay | Admitting: *Deleted

## 2019-03-20 ENCOUNTER — Emergency Department (HOSPITAL_BASED_OUTPATIENT_CLINIC_OR_DEPARTMENT_OTHER)
Admission: EM | Admit: 2019-03-20 | Discharge: 2019-03-21 | Disposition: A | Discharge status: Home / Self Care | Payer: Medicare Other | Attending: Emergency Medicine | Admitting: Emergency Medicine

## 2019-03-20 DIAGNOSIS — R29810 Facial weakness: Secondary | ICD-10-CM | POA: Diagnosis not present

## 2019-03-20 DIAGNOSIS — I129 Hypertensive chronic kidney disease with stage 1 through stage 4 chronic kidney disease, or unspecified chronic kidney disease: Secondary | ICD-10-CM | POA: Diagnosis not present

## 2019-03-20 DIAGNOSIS — I10 Essential (primary) hypertension: Secondary | ICD-10-CM | POA: Diagnosis not present

## 2019-03-20 DIAGNOSIS — Z79899 Other long term (current) drug therapy: Secondary | ICD-10-CM | POA: Insufficient documentation

## 2019-03-20 DIAGNOSIS — I2510 Atherosclerotic heart disease of native coronary artery without angina pectoris: Secondary | ICD-10-CM | POA: Insufficient documentation

## 2019-03-20 DIAGNOSIS — Z8546 Personal history of malignant neoplasm of prostate: Secondary | ICD-10-CM | POA: Diagnosis not present

## 2019-03-20 DIAGNOSIS — R52 Pain, unspecified: Secondary | ICD-10-CM | POA: Diagnosis not present

## 2019-03-20 DIAGNOSIS — N182 Chronic kidney disease, stage 2 (mild): Secondary | ICD-10-CM | POA: Insufficient documentation

## 2019-03-20 DIAGNOSIS — R4781 Slurred speech: Secondary | ICD-10-CM | POA: Diagnosis not present

## 2019-03-20 DIAGNOSIS — Z8673 Personal history of transient ischemic attack (TIA), and cerebral infarction without residual deficits: Secondary | ICD-10-CM | POA: Diagnosis not present

## 2019-03-20 DIAGNOSIS — Z7901 Long term (current) use of anticoagulants: Secondary | ICD-10-CM | POA: Diagnosis not present

## 2019-03-20 DIAGNOSIS — Z20822 Contact with and (suspected) exposure to covid-19: Secondary | ICD-10-CM | POA: Insufficient documentation

## 2019-03-20 DIAGNOSIS — E1140 Type 2 diabetes mellitus with diabetic neuropathy, unspecified: Secondary | ICD-10-CM | POA: Diagnosis not present

## 2019-03-20 DIAGNOSIS — R531 Weakness: Secondary | ICD-10-CM | POA: Insufficient documentation

## 2019-03-20 DIAGNOSIS — E1122 Type 2 diabetes mellitus with diabetic chronic kidney disease: Secondary | ICD-10-CM | POA: Diagnosis not present

## 2019-03-20 DIAGNOSIS — Z794 Long term (current) use of insulin: Secondary | ICD-10-CM | POA: Insufficient documentation

## 2019-03-20 DIAGNOSIS — R29898 Other symptoms and signs involving the musculoskeletal system: Secondary | ICD-10-CM | POA: Insufficient documentation

## 2019-03-20 LAB — COMPREHENSIVE METABOLIC PANEL
ALT: 23 U/L (ref 0–44)
AST: 21 U/L (ref 15–41)
Albumin: 3.7 g/dL (ref 3.5–5.0)
Alkaline Phosphatase: 90 U/L (ref 38–126)
Anion gap: 7 (ref 5–15)
BUN: 19 mg/dL (ref 8–23)
CO2: 27 mmol/L (ref 22–32)
Calcium: 8.9 mg/dL (ref 8.9–10.3)
Chloride: 101 mmol/L (ref 98–111)
Creatinine, Ser: 1.31 mg/dL — ABNORMAL HIGH (ref 0.61–1.24)
GFR calc Af Amer: >60 mL/min (ref >60)
GFR calc non Af Amer: 53 mL/min — ABNORMAL LOW (ref >60)
Glucose, Bld: 214 mg/dL — ABNORMAL HIGH (ref 70–99)
Potassium: 4.2 mmol/L (ref 3.5–5.1)
Sodium: 135 mmol/L (ref 135–145)
Total Bilirubin: 0.8 mg/dL (ref 0.3–1.2)
Total Protein: 6.9 g/dL (ref 6.5–8.1)

## 2019-03-20 LAB — DIFFERENTIAL
Abs Immature Granulocytes: 0.01 10*3/uL (ref 0.00–0.07)
Basophils Absolute: 0 10*3/uL (ref 0.0–0.1)
Basophils Relative: 0 %
Eosinophils Absolute: 0 10*3/uL (ref 0.0–0.5)
Eosinophils Relative: 0 %
Immature Granulocytes: 0 %
Lymphocytes Relative: 24 %
Lymphs Abs: 1.4 10*3/uL (ref 0.7–4.0)
Monocytes Absolute: 0.6 10*3/uL (ref 0.1–1.0)
Monocytes Relative: 10 %
Neutro Abs: 3.7 10*3/uL (ref 1.7–7.7)
Neutrophils Relative %: 66 %

## 2019-03-20 LAB — PROTIME-INR
INR: 1.6 — ABNORMAL HIGH (ref 0.8–1.2)
Prothrombin Time: 18.6 seconds — ABNORMAL HIGH (ref 11.4–15.2)

## 2019-03-20 LAB — RAPID URINE DRUG SCREEN, HOSP PERFORMED
Amphetamines: NOT DETECTED
Barbiturates: NOT DETECTED
Benzodiazepines: NOT DETECTED
Cocaine: NOT DETECTED
Opiates: NOT DETECTED
Tetrahydrocannabinol: NOT DETECTED

## 2019-03-20 LAB — URINALYSIS, MICROSCOPIC (REFLEX)

## 2019-03-20 LAB — ETHANOL: Alcohol, Ethyl (B): <10 mg/dL (ref <10)

## 2019-03-20 LAB — CBC
HCT: 42.2 % (ref 39.0–52.0)
Hemoglobin: 14.2 g/dL (ref 13.0–17.0)
MCH: 31.6 pg (ref 26.0–34.0)
MCHC: 33.6 g/dL (ref 30.0–36.0)
MCV: 94 fL (ref 80.0–100.0)
Platelets: 182 10*3/uL (ref 150–400)
RBC: 4.49 MIL/uL (ref 4.22–5.81)
RDW: 12.5 % (ref 11.5–15.5)
WBC: 5.7 10*3/uL (ref 4.0–10.5)
nRBC: 0 % (ref 0.0–0.2)

## 2019-03-20 LAB — URINALYSIS, ROUTINE W REFLEX MICROSCOPIC
Bilirubin Urine: NEGATIVE
Glucose, UA: NEGATIVE mg/dL
Ketones, ur: NEGATIVE mg/dL
Nitrite: NEGATIVE
Protein, ur: NEGATIVE mg/dL
Specific Gravity, Urine: <1.005 — ABNORMAL LOW (ref 1.005–1.030)
pH: 6 (ref 5.0–8.0)

## 2019-03-20 LAB — APTT: aPTT: 29 seconds (ref 24–36)

## 2019-03-20 LAB — SARS CORONAVIRUS 2 AG (30 MIN TAT): SARS Coronavirus 2 Ag: NEGATIVE

## 2019-03-20 MED ORDER — ACETAMINOPHEN 325 MG PO TABS
ORAL_TABLET | ORAL | Status: AC
  Filled 2019-03-20: qty 2

## 2019-03-20 MED ORDER — ACETAMINOPHEN 325 MG PO TABS
650.0000 mg | ORAL_TABLET | Freq: Once | ORAL | Status: AC
  Administered 2019-03-20: 650 mg via ORAL

## 2019-03-20 NOTE — ED Provider Notes (Signed)
[facility] EMERGENCY DEPARTMENT Provider Note   CSN: [id number] Arrival date & time: 03/20/19  1851     History Chief Complaint  Patient presents with  . Weakness    Steve Brown is a 79 y.o. male.  HPI 79 year old male presents with left-sided pain and weakness.  Started last night at dinnertime.  Has not gone away.  He feels pain from his left ear to his foot.  His left arm feels heavy and also a little bit in his left leg.  No numbness.  He feels like his speech is slightly slurred.  Call his doctor told him to come to the ER.  He is had multiple prior strokes and TIAs.  No headache.  His neck hurts that he states is a part of the pain going from here to foot.   Past Medical History:  Diagnosis Date  . Abnormal prostate biopsy   . Anticoagulant long-term use    currently xarelto  . BPH with elevated PSA   . CKD (chronic kidney disease), stage II   . Complication of anesthesia    limted neck rom limited use of left arm due to cva  . Coronary artery disease    CARDIOLOGIST-  DR Irish Lack--  2010-- PCI w/ stenting midLAD  . DDD (degenerative disc disease), lumbar   . Degeneration of cervical intervertebral disc   . Depression   . Dyspnea on exertion   . GERD (gastroesophageal reflux disease)   . Hemiparesis due to cerebral infarction   . History of cerebrovascular accident (CVA) with residual deficit 2002 and 2003--  hemiparisis both sides   per MRI  anterior left frontal lobe, left para midline pons, and inferior cerebullam bilaterally infarcts  . History of pulmonary embolus (PE)    06-30-2012  extensive bilaterally  . History of recurrent TIAs   . History of syncope    hx multiple pre-syncope and syncopal episodes due to vasovagal, orthostatic hypotension, dehydration  . History of TIAs    several since 2002  . Hyperlipidemia   . Hypertension   . Mild atherosclerosis of carotid artery, bilateral    per last duplex 11-04-2014  bilateral ICA 1--39%  .  Neuropathy    fingers  . OSA on CPAP    followed by dr dohmeier--  sev. osa w/ AHI 65.9  . Prostate cancer ([place]) dx 2018  . Renal insufficiency   . S/P coronary artery stent placement 2010   stenting to mid LAD  . Simple renal cyst    bilaterally  . Stroke ([place])   . Trigger finger of both hands 11-17-13  . Type 2 diabetes mellitus ([employer]) dx 1986   last one A1c 9.2 on 04-26-2016  . Unsteady gait    . Hx prior CVA/TIAs;  . Vertebral artery occlusion, left    chronic    Patient Active Problem List   Diagnosis Date Noted  . 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 ([place]) 10/21/2018  . Nocturnal hypoxemia 10/21/2018  . Radiation therapy complication [id number]  . Primary prostate cancer ([place]) 07/31/2017  . Anemia 07/31/2017  . Vertigo due to cerebrovascular disease 07/31/2017  . Poor compliance with CPAP treatment 07/31/2017  . Absolute anemia 05/16/2017  . Avitaminosis D 05/16/2017  . Benign essential HTN 05/16/2017  . Benign prostatic hypertrophy without urinary obstruction 05/16/2017  . Clinical depression 05/16/2017  . CN (constipation) 05/16/2017  . Current drug use 05/16/2017  . Diabetic neuropathy ([place]) 05/16/2017  . Genital herpes 05/16/2017  . Infarction  of lung due to iatrogenic pulmonary embolism ([place]) 05/16/2017  . Peripheral neuralgia 05/16/2017  . Arteriosclerosis of coronary artery 05/16/2017  . Artery disease, cerebral 05/16/2017  . Apnea, sleep 05/16/2017  . Arthralgia of hip or thigh 05/16/2017  . Malignant neoplasm of prostate ([place]) 09/28/2016  . Vascular dementia in remission ([place]) 09/28/2016  . Remote history of stroke 09/28/2016  . Acute kidney injury ([place]) 05/18/2016  . Dehydration 05/18/2016  . Near syncope 05/18/2016  . Orthostatic hypotension 05/18/2016  . CKD (chronic kidney disease), stage II 04/26/2016  . UTI (urinary tract infection) 04/26/2016  . Encounter for counseling on use of CPAP 11/18/2015  . Chronic cholecystitis with  calculus 11/02/2015  . Stroke, 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[place]) 04/22/2015  . TIA (transient ischemic attack) 11/03/2014  . OSA on CPAP 05/18/2014  . White matter disease 08/14/2013  . OSA (obstructive sleep apnea) 08/14/2013  . Abnormal x-ray of temporomandibular joint 05/30/2013  . Chronic infection of sinus 05/30/2013  . Cough 05/30/2013  . Fatigue 05/30/2013  . Unsteady gait 05/19/2013  . Combined fat and carbohydrate induced hyperlipemia 04/24/2013  . Syncope 03/20/2013  . Angina pectoris ([place]) 10/08/2012  . Hypertension   . Stroke ([place])   . History of TIAs   . Lumbago   . Other and unspecified hyperlipidemia   . Personal history of unspecified circulatory disease   . Unspecified fall   . Pain in joint, multiple sites   . Degeneration of cervical intervertebral disc   . Unspecified cardiovascular disease   . History of pulmonary embolism: June 2014,  Takes Xarelto 07/01/2012    Class: History of  . Hemiparesis ([place]) 07/18/2011  . Diabetes mellitus type 2 with complications ([place]) 99991111  . HTN (hypertension) 07/18/2011  . CAD in native artery 07/18/2011  . History of recurrent TIAs 07/18/2011    Past Surgical History:  Procedure Laterality Date  . ANTERIOR CERVICAL DECOMP/DISCECTOMY FUSION  2004   C3 -- C6 limited rom  . CARDIAC CATHETERIZATION  06-10-2010   dr Irish Lack   wide patent LAD stent, mid lesion at the origin of the septal prior to the previous stent 40-50%/  normal LVF, ef 55%  . CARDIOVASCULAR STRESS TEST  10-23-2012  dr Irish Lack   normal nuclear perfusion study w/ no ischemia/  normal LV function and wall motion , ef 65%  . CARPAL TUNNEL RELEASE Bilateral   . CATARACT EXTRACTION W/ INTRAOCULAR LENS  IMPLANT, BILATERAL    . CHOLECYSTECTOMY N/A 11/02/2015   Procedure: LAPAROSCOPIC CHOLECYSTECTOMY WITH INTRAOPERATIVE CHOLANGIOGRAM;  Surgeon: Donnie Mesa, MD;  Location: [facility];  Service: General;  Laterality: N/A;  . COLONOSCOPY    . CORONARY ANGIOPLASTY WITH  STENT PLACEMENT  02/2008   stenting to mid LAD  . GOLD SEED IMPLANT N/A 11/15/2016   Procedure: GOLD SEED IMPLANT TIMES THREE;  Surgeon: Ardis Hughs, MD;  Location: [facility];  Service: Urology;  Laterality: N/A;  . IR ANGIO INTRA EXTRACRAN SEL COM CAROTID INNOMINATE BILAT MOD SED  06/13/2018  . IR ANGIO VERTEBRAL SEL VERTEBRAL UNI R MOD SED  06/13/2018  . IR US GUIDE VASC ACCESS RIGHT  06/13/2018  . LEFT HEART CATH AND CORONARY ANGIOGRAPHY N/A 05/25/2017   Procedure: LEFT HEART CATH AND CORONARY ANGIOGRAPHY;  Surgeon: Jettie Booze, MD;  Location: [facility] CV LAB;  Service: Cardiovascular;  Laterality: N/A;  . LEFT HEART CATHETERIZATION WITH CORONARY ANGIOGRAM N/A 04/03/2013   Procedure: LEFT HEART CATHETERIZATION WITH CORONARY ANGIOGRAM;  Surgeon: Charlann Lange  Irish Lack, MD;  Location: [facility] CATH LAB;  Service: Cardiovascular;  Laterality: N/A;  patent mLAD stent  w/ mild disease in remainder LAD and its branches;  mod. focal lesion midLCFx- FFR of lesion was negative for ischemia/  normal LVSF, ef 50%  . lungs  2005   "fluid pumped off lungs"  . NEUROPLASTY / TRANSPOSITION ULNAR NERVE AT ELBOW Right 2004  . PROSTATE BIOPSY N/A 08/31/2016   Procedure: PROSTATE  BIOPSY TRANSRECTAL ULTRASONIC PROSTATE (TUBP);  Surgeon: Ardis Hughs, MD;  Location: [facility];  Service: Urology;  Laterality: N/A;  . SPACE OAR INSTILLATION N/A 11/15/2016   Procedure: SPACE OAR INSTILLATION;  Surgeon: Ardis Hughs, MD;  Location: [facility]  Service: Urology;  Laterality: N/A;  . TRANSTHORACIC ECHOCARDIOGRAM  04/27/2016   severe focal basal LVH, ef 60-65%,  grade 2 diastoilc dysfunction/  mild AR, MR, and TR/  atrial septum lipomatous hypertrophy/  PASP 40mmHg  . UMBILICAL HERNIA REPAIR         Family History  Problem Relation Age of Onset  . Aneurysm Mother   . Cancer Father        unknown either pancreatic or prostate  . Stroke Brother   .  Dementia Sister   . Heart attack Neg Hx     Social History   Tobacco Use  . Smoking status: Never Smoker  . Smokeless tobacco: Never Used  Substance Use Topics  . Alcohol use: No    Alcohol/week: 0.0 standard drinks  . Drug use: No    Home Medications Prior to Admission medications   Medication Sig Start Date End Date Taking? Authorizing Provider  acetaminophen (TYLENOL) 500 MG tablet Take 1,000 mg by mouth every 6 (six) hours as needed (pain).     [provider]  Ascorbic Acid (VITAMIN C PO) Take 1 tablet by mouth daily.    [provider]  bisacodyl (DULCOLAX) 5 MG EC tablet Take 5 mg by mouth daily as needed for moderate constipation.    [provider]  cholecalciferol (VITAMIN D) 1000 UNITS tablet Take 1,000 Units by mouth daily.    [provider]  diclofenac sodium (VOLTAREN) 1 % GEL Apply 1 application topically See admin instructions. Apply topically once daily , may use an additional time as needed for pain 10/16/16   [provider]  Echinacea 450 MG CAPS Take 450 mg by mouth daily.     [provider]  fluticasone (FLONASE) 50 MCG/ACT nasal spray Place 1 spray into both nostrils 2 (two) times daily as needed for allergies.  09/26/16   [provider]  furosemide (LASIX) 20 MG tablet Take 20 mg by mouth daily as needed (leg and ankle swelling).     [provider]  insulin lispro (HUMALOG KWIKPEN) 100 UNIT/ML KiwkPen Inject 6-10 Units into the skin 3 (three) times daily with meals.    [provider]  IRON PO Take 1 tablet by mouth daily.    [provider]  losartan (COZAAR) 100 MG tablet Take 100 mg by mouth daily. 03/29/18   [provider]  meclizine (ANTIVERT) 25 MG tablet Take 12.5-25 mg by mouth 3 (three) times daily as needed for dizziness.    [provider]  Multiple Vitamin (MULTIVITAMIN WITH MINERALS) TABS tablet Take 1 tablet by mouth daily.    [provider]  nitroGLYCERIN (NITROSTAT) 0.4 MG SL tablet Place 1 tablet (0.4 mg total) under the tongue every 5 (  five) minutes as needed for chest pain. 12/07/15   Jettie Booze, MD  pantoprazole (PROTONIX) 40 MG tablet Take 40 mg by mouth daily as needed (acid reflux/heartburn).     [provider]  pregabalin (LYRICA) 300 MG capsule Take 1 capsule (300 mg total) by mouth 2 (two) times daily. 07/26/12   Dohmeier, Asencion Partridge, MD  rivaroxaban (XARELTO) 20 MG TABS tablet Take 20 mg by mouth daily.     [provider]  rosuvastatin (CRESTOR) 10 MG tablet Take 10 mg by mouth at bedtime.     [provider]  tamsulosin (FLOMAX) 0.4 MG CAPS capsule Take 0.4 mg by mouth daily. 05/09/16   [provider]  TRESIBA FLEXTOUCH 100 UNIT/ML SOPN FlexTouch Pen Inject 20 Units into the skin every morning. 05/11/18   [provider]  valACYclovir (VALTREX) 500 MG tablet Take 500 mg by mouth daily.    [provider]  zinc gluconate 50 MG tablet Take 50 mg by mouth daily.    [provider]    Allergies    Other, Phenergan [promethazine], and Tramadol  Review of Systems   Review of Systems  Cardiovascular: Negative for chest pain.  Musculoskeletal: Positive for neck pain.  Neurological: Positive for weakness. Negative for numbness and headaches.  All other systems reviewed and are negative.   Physical Exam Updated Vital Signs BP (!) 169/84   Pulse (!) 59   Temp 98.6 F (37 C) (Oral)   Resp 17   Ht 5\' 5"  (1.651 m)   Wt 93 kg   SpO2 96%   BMI 34.11 kg/m   Physical Exam Vitals and nursing note reviewed.  Constitutional:      Appearance: He is well-developed.  HENT:     Head: Normocephalic and atraumatic.     Right Ear: External ear normal.     Left Ear: External ear normal.     Nose: Nose normal.  Eyes:     General:        Right eye: No discharge.        Left eye: No discharge.     Extraocular Movements: Extraocular movements  intact.     Pupils: Pupils are equal, round, and reactive to light.  Cardiovascular:     Rate and Rhythm: Normal rate and regular rhythm.     Heart sounds: Normal heart sounds.  Pulmonary:     Effort: Pulmonary effort is normal.     Breath sounds: Normal breath sounds.  Abdominal:     General: There is no distension.     Palpations: Abdomen is soft.     Tenderness: There is no abdominal tenderness.  Musculoskeletal:     Cervical back: Neck supple.  Skin:    General: Skin is warm and dry.  Neurological:     Mental Status: He is alert.     Comments: CN 3-12 grossly intact. 5/5 strength in all 4 extremities but slightly weaker in LUE. Grossly normal sensation. Normal finger to nose.   Psychiatric:        Mood and Affect: Mood is not anxious.     ED Results / Procedures / Treatments   Labs (all labs ordered are listed, but only abnormal results are displayed) Labs Reviewed  PROTIME-INR - Abnormal; Notable for the following components:      Result Value   Prothrombin Time 18.6 (*)    INR 1.6 (*)    All other components within normal limits  COMPREHENSIVE METABOLIC PANEL -  Abnormal; Notable for the following components:   Glucose, Bld 214 (*)    Creatinine, Ser 1.31 (*)    GFR calc non Af Amer 53 (*)    All other components within normal limits  URINALYSIS, ROUTINE W REFLEX MICROSCOPIC - Abnormal; Notable for the following components:   APPearance HAZY (*)    Specific Gravity, Urine <1.005 (*)    Hgb urine dipstick MODERATE (*)    Leukocytes,Ua SMALL (*)    All other components within normal limits  URINALYSIS, MICROSCOPIC (REFLEX) - Abnormal; Notable for the following components:   Bacteria, UA RARE (*)    All other components within normal limits  ETHANOL  APTT  CBC  DIFFERENTIAL  RAPID URINE DRUG SCREEN, HOSP PERFORMED    EKG EKG Interpretation  Date/Time:  Thursday March 20 2019 19:00:03 EST Ventricular Rate:  64 PR Interval:    QRS Duration: 87 QT  Interval:  398 QTC Calculation: 411 R Axis:   -9 Text Interpretation: Sinus rhythm Low voltage, precordial leads RSR' in V1 or V2, right VCD or RVH no significant change since Jan 2021 Confirmed by Sherwood Gambler [phone]) on 03/20/2019 7:16:58 PM   Radiology CT HEAD WO CONTRAST  Result Date: 03/20/2019 CLINICAL DATA:  Slurred speech and left-sided weakness, initial encounter EXAM: CT HEAD WITHOUT CONTRAST TECHNIQUE: Contiguous axial images were obtained from the base of the skull through the vertex without intravenous contrast. COMPARISON:  12/16/2018 FINDINGS: Brain: Mild atrophic changes and chronic white matter ischemic changes are seen. No findings to suggest acute hemorrhage, acute infarction or space-occupying mass lesion are noted. Vascular: No hyperdense vessel or unexpected calcification. Skull: Normal. Negative for fracture or focal lesion. Sinuses/Orbits: Ethmoid and sphenoid opacification is noted stable in appearance from the prior exam. Other: Left scalp lipoma is noted and stable. IMPRESSION: Chronic atrophic and ischemic changes without acute abnormality Electronically Signed   By: Inez Catalina M.D.   On: 03/20/2019 19:35    Procedures Procedures (including critical care time)  Medications Ordered in ED Medications - No data to display  ED Course  I have reviewed the triage vital signs and the nursing notes.  Pertinent labs & imaging results that were available during my care of the patient were reviewed by me and considered in my medical decision making (see chart for details).    MDM Rules/Calculators/A&P                      Patient has remained with subtle weakness.  Teleneurology was consulted.  I was unable to physically talk to the neurologist but note indicates he needs to get MRI.  Unclear if he intends for patient to be admitted but I think we need to confirm whether or not there is a stroke first patient will thus be transferred to [facility], ER for MRI.   Discussed with Dr. Francia Greaves who accepts in transfer. Final Clinical Impression(s) / ED Diagnoses Final diagnoses:  Left-sided weakness    Rx / DC Orders ED Discharge Orders    None       Sherwood Gambler, MD 03/20/19 [id number]

## 2019-03-20 NOTE — Consult Note (Signed)
[facility] TeleNeurology Consult Services  Stat Consult  Date of Service:   03/20/2019 20:11:39  Impression:     .  I63.9 - Cerebrovascular accident (CVA), unspecified mechanism ([place])  Comments/Sign-Out: acute onset mild left sided weakness - concerning for new right parietal/subcortical stroke vs recrudescence from previous stroke. Recommend admission for stroke workup.  CT HEAD: Showed No Acute Hemorrhage or Acute Core Infarct Reviewed  Metrics: [employer] Notification Time: 03/20/2019 20:09:45 Stamp Time: 03/20/2019 20:11:39 Callback Response Time: 03/20/2019 20:14:59  Our recommendations are outlined below.  Recommendations:     .  hold xarelto for now to reduce risk of hemorrhagic conversion. ASA in the meantime.   Imaging Studies:     .  MRI Head     .  MRA Head and Neck Without Contrast When Available - Stroke Protocol  Therapies:     .  Physical Therapy, Occupational Therapy, Speech Therapy Assessment When Applicable  Other WorkUp:     .  Infectious/metabolic workup per primary team  Disposition: Neurology Follow Up Recommended  Sign Out:     .  Discussed with Emergency Department Provider  ----------------------------------------------------------------------------------------------------  Chief Complaint: left sided weakness  History of Present Illness: Patient is a 79 year old Male.  79 year old man with acute onset left sided weakness. He reports he felt tired and 'achy' starting at approximately 1800. It has persisted all throughout the day. He called his physician, who recommended he come to the ED. He is still able to walk. He also describes slurred speech. He also has mild blurry vision. 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    Past Medical History:     . Atrial Fibrillation     . Stroke  Anticoagulant use:  xarelto - last dose this  am.  Antiplatelet use: No     Examination: BP(168/87), Pulse(56), Blood Glucose(214) 1A: Level of Consciousness - Alert; keenly responsive + 0 1B: Ask Month and Age - Both Questions Right + 0 1C: Blink Eyes & Squeeze Hands - Performs Both Tasks + 0 2: Test Horizontal Extraocular Movements - Normal + 0 3: Test Visual Fields - No Visual Loss + 0 4: Test Facial Palsy (Use Grimace if Obtunded) - Normal symmetry + 0 5A: Test Left Arm Motor Drift - No Drift for 10 Seconds + 0 5B: Test Right Arm Motor Drift - No Drift for 10 Seconds + 0 6A: Test Left Leg Motor Drift - No Drift for 5 Seconds + 0 6B: Test Right Leg Motor Drift - No Drift for 5 Seconds + 0 7: Test Limb Ataxia (FNF/Heel-Shin) - No Ataxia + 0 8: Test Sensation - Normal; No sensory loss + 0 9: Test Language/Aphasia - Normal; No aphasia + 0 10: Test Dysarthria - Normal + 0 11: Test Extinction/Inattention - No abnormality + 0  NIHSS Score: 0    Due to the immediate potential for life-threatening deterioration due to underlying acute neurologic illness, I spent 20 minutes providing critical care. This time includes time for face to face visit via telemedicine, review of medical records, imaging studies and discussion of findings with providers, the patient and/or family.   Dr Hal Morales   [facility] [phone]  Case [id number]

## 2019-03-20 NOTE — ED Notes (Signed)
BIB [facility] from [facility] ED. Transferred for MRI r/o Stroke. NIHSS 0. A/OX4.

## 2019-03-20 NOTE — ED Triage Notes (Signed)
Pt states las pm felt bad  Pain from left side of face down neck  side to leg with left arm weakness and heavy  Pt states he has slurred speech states feels like the last time he had [place]

## 2019-03-20 NOTE — ED Notes (Signed)
[facility] notified Steve Brown) - patient ready for transport to [facility] ED

## 2019-03-21 ENCOUNTER — Ambulatory Visit (HOSPITAL_COMMUNITY)
Admission: RE | Admit: 2019-03-21 | Discharge: 2019-03-21 | Disposition: A | Discharge status: Home / Self Care | Payer: Medicare Other | Source: Ambulatory Visit | Attending: Family Medicine | Admitting: Family Medicine

## 2019-03-21 ENCOUNTER — Other Ambulatory Visit (HOSPITAL_COMMUNITY): Payer: Self-pay | Admitting: Family Medicine

## 2019-03-21 DIAGNOSIS — R531 Weakness: Secondary | ICD-10-CM | POA: Diagnosis not present

## 2019-03-21 DIAGNOSIS — M79602 Pain in left arm: Secondary | ICD-10-CM

## 2019-03-21 DIAGNOSIS — R410 Disorientation, unspecified: Secondary | ICD-10-CM

## 2019-03-21 DIAGNOSIS — R4781 Slurred speech: Secondary | ICD-10-CM | POA: Diagnosis not present

## 2019-03-21 MED ORDER — LORAZEPAM 1 MG PO TABS
1.0000 mg | ORAL_TABLET | Freq: Once | ORAL | Status: DC
  Filled 2019-03-21: qty 1

## 2019-03-21 NOTE — ED Provider Notes (Signed)
Patient transferred from [facility] with slight left-sided weakness, to get MRI to rule out stroke.  Patient is resting comfortably.  On exam, I cannot elicit any clear weakness.  MRI is ordered.  Patient is refusing MRI stating he will only go to an open MRI scanner.  Since there was question of stroke but no weakness on my exam, it is felt that he is safe for discharge and is referred back to his PCP to order MRI as an outpatient.   Steve Fuel, MD [phone]

## 2019-03-21 NOTE — ED Notes (Addendum)
Patient refused MRI , will notify MD . Patient stated he is considering out - patient MRI .

## 2019-03-21 NOTE — Discharge Instructions (Addendum)
You came to [facility] emergency department for an MRI scan to rule out stroke.  You have refused to have the MRI done here.  Please be aware that it is possible that you have had a stroke and we cannot start treatment until it is diagnosed.  Please see your primary care provider as soon as possible to make arrangements to have the MRI done as an outpatient.  If you change your mind at any point, you are welcome to come back to the emergency department.

## 2019-03-24 DIAGNOSIS — E1140 Type 2 diabetes mellitus with diabetic neuropathy, unspecified: Secondary | ICD-10-CM | POA: Diagnosis not present

## 2019-03-24 DIAGNOSIS — H532 Diplopia: Secondary | ICD-10-CM | POA: Diagnosis not present

## 2019-03-24 DIAGNOSIS — R519 Headache, unspecified: Secondary | ICD-10-CM | POA: Diagnosis not present

## 2019-03-24 DIAGNOSIS — H538 Other visual disturbances: Secondary | ICD-10-CM | POA: Diagnosis not present

## 2019-03-27 ENCOUNTER — Other Ambulatory Visit: Payer: Self-pay | Admitting: *Deleted

## 2019-03-27 NOTE — Patient Outreach (Signed)
[place] [facility]) Care Management  03/27/2019  Steve Brown 11/17/1940 [id number]   Unsuccessful outreach attempt made to patient for telephone assessment. Patient explained that he would not be able to talk today and ask that the nurse call him at a later date.   Plan: RN Health Coach will call patient within the month of March  [place], [place] [phone] [email or web address]

## 2019-03-28 ENCOUNTER — Other Ambulatory Visit: Payer: Self-pay | Admitting: *Deleted

## 2019-03-28 DIAGNOSIS — M25519 Pain in unspecified shoulder: Secondary | ICD-10-CM | POA: Diagnosis not present

## 2019-03-28 DIAGNOSIS — G894 Chronic pain syndrome: Secondary | ICD-10-CM | POA: Diagnosis not present

## 2019-03-28 DIAGNOSIS — M79606 Pain in leg, unspecified: Secondary | ICD-10-CM | POA: Diagnosis not present

## 2019-03-28 DIAGNOSIS — M545 Low back pain: Secondary | ICD-10-CM | POA: Diagnosis not present

## 2019-03-28 NOTE — Patient Outreach (Signed)
[place] [facility]) Care Management  03/28/2019  Steve Brown Sep 17, 1940 [id number]   Successful telephone outreach call to patient. HIPAA identifiers obtained. Called patient initially at 1200 and patient stated he was just returning from an appointment with his pain clinic physician and asked if nurse could call him back around 1330. Nurse called patient back and HIPAA identifiers were obtained. Patient explained that he has not been doing well lately. He has been in a lot of pain with his right arm and recently went to the ED on 03/20/19 for left sided weakness. He commented that at the doctor's office today his B/P was high. He does not remember how high and he does not have a blood pressure cuff to re-evaluate; adding that he forgot to take his blood pressure medicine yesterday and this morning. At this point patient explained that he was not feeling well and asked nurse if it would be alright if he did not continue with the conversation "there is just a lot going on." Nurse discussed if needed [name] does have LCSW that could contact patient if he felt like this would be beneficial and nurse voiced concerned regarding patient's B/P and requested that the patient contact her if he felt she could help him in any way.    Plan: RN Health Coach will call patient within a month and patient agrees to future outreach calls.   Emelia Loron RN, BSN [name] [phone] [name]

## 2019-03-31 DIAGNOSIS — J310 Chronic rhinitis: Secondary | ICD-10-CM | POA: Diagnosis not present

## 2019-03-31 DIAGNOSIS — J342 Deviated nasal septum: Secondary | ICD-10-CM | POA: Diagnosis not present

## 2019-03-31 DIAGNOSIS — R42 Dizziness and giddiness: Secondary | ICD-10-CM | POA: Diagnosis not present

## 2019-03-31 DIAGNOSIS — J343 Hypertrophy of nasal turbinates: Secondary | ICD-10-CM | POA: Diagnosis not present

## 2019-04-03 DIAGNOSIS — E7800 Pure hypercholesterolemia, unspecified: Secondary | ICD-10-CM | POA: Diagnosis not present

## 2019-04-03 DIAGNOSIS — N189 Chronic kidney disease, unspecified: Secondary | ICD-10-CM | POA: Diagnosis not present

## 2019-04-03 DIAGNOSIS — I10 Essential (primary) hypertension: Secondary | ICD-10-CM | POA: Diagnosis not present

## 2019-04-03 DIAGNOSIS — E1165 Type 2 diabetes mellitus with hyperglycemia: Secondary | ICD-10-CM | POA: Diagnosis not present

## 2019-04-03 DIAGNOSIS — C61 Malignant neoplasm of prostate: Secondary | ICD-10-CM | POA: Diagnosis not present

## 2019-04-03 DIAGNOSIS — G609 Hereditary and idiopathic neuropathy, unspecified: Secondary | ICD-10-CM | POA: Diagnosis not present

## 2019-04-03 DIAGNOSIS — E669 Obesity, unspecified: Secondary | ICD-10-CM | POA: Diagnosis not present

## 2019-04-03 DIAGNOSIS — I639 Cerebral infarction, unspecified: Secondary | ICD-10-CM | POA: Diagnosis not present

## 2019-04-22 DIAGNOSIS — Z8546 Personal history of malignant neoplasm of prostate: Secondary | ICD-10-CM | POA: Diagnosis not present

## 2019-04-28 ENCOUNTER — Other Ambulatory Visit: Payer: Self-pay | Admitting: *Deleted

## 2019-04-28 ENCOUNTER — Encounter: Payer: Self-pay | Admitting: *Deleted

## 2019-04-28 NOTE — Patient Outreach (Signed)
[place] [facility]) Care Management  [place]  04/28/2019   Steve Brown 1940-09-28 [id number]  Subjective: Patient states that he is doing fairly well explaining there has bee a lot going on both physically and emotionally. Patient states he has been in a lot of pain particularly with his left shoulder and back. This has prevented him from being able to workout at the [facility] which is loves because he has many good friends he meets at the gym. Patient reports that he received a shot in his shoulder for the pain and it elevated his blood sugars, he recently had to go to the ED on 03/20/19 for left sided weakness which was very emotional given the stroke he has had in the past, and he feels isolated and alone due to Covid and in general. Patient does feel that speaking with LCSW would be beneficial and nurse will place CSW referral. Nurse did discuss distraction and relaxation techniques with patient. Patient's FBS today was 264. He feels it was high because he ate a piece of coconut pie last night. He adds when I am feeling alone and low it is hard for me to pass by the cabinet without getting something to eat. Patient also explains that he has had a few hypoglycemic episodes as well when he eats lightly and takes his medication; it dropped to 60. Patient reports his ranges are from 60->300. Steve Brown did say he is going to see a diabetic RD June 10 th and that he does want to get control of his diabetes.  Nurse did provide diabetes education and encourage patient to eat protein with his meals and drink Glucerna if he does eat lightly to decrease chances of hypoglycemia. Patient explained that he would start to read food labels and would try to decrease that amount of carbohydrates he is consuming daily. Patient denies any falls and states he is not having any difficulty with maintaining his home or running necessary errands independently. He did not have any further concerns at this time.    Encounter Medications:  Outpatient Encounter Medications as of 04/28/2019  Medication Sig  . acetaminophen (TYLENOL) 500 MG tablet Take 1,000 mg by mouth every 6 (six) hours as needed (pain).   . Ascorbic Acid (VITAMIN C PO) Take 1 tablet by mouth daily.  . bisacodyl (DULCOLAX) 5 MG EC tablet Take 5 mg by mouth daily as needed for moderate constipation.  . cholecalciferol (VITAMIN D) 1000 UNITS tablet Take 1,000 Units by mouth daily.  . diclofenac sodium (VOLTAREN) 1 % GEL Apply 1 application topically See admin instructions. Apply topically once daily , may use an additional time as needed for pain  . Echinacea 450 MG CAPS Take 450 mg by mouth daily.   . fluticasone (FLONASE) 50 MCG/ACT nasal spray Place 1 spray into both nostrils 2 (two) times daily as needed for allergies.   . furosemide (LASIX) 20 MG tablet Take 20 mg by mouth daily as needed (leg and ankle swelling).   . insulin lispro (HUMALOG KWIKPEN) 100 UNIT/ML KiwkPen Inject 6-10 Units into the skin 3 (three) times daily with meals.  . IRON PO Take 1 tablet by mouth daily.  Marland Kitchen losartan (COZAAR) 100 MG tablet Take 100 mg by mouth daily.  . meclizine (ANTIVERT) 25 MG tablet Take 12.5-25 mg by mouth 3 (three) times daily as needed for dizziness.  . Multiple Vitamin (MULTIVITAMIN WITH MINERALS) TABS tablet Take 1 tablet by mouth daily.  . nitroGLYCERIN (NITROSTAT) 0.4  MG SL tablet Place 1 tablet (0.4 mg total) under the tongue every 5 (five) minutes as needed for chest pain.  . pantoprazole (PROTONIX) 40 MG tablet Take 40 mg by mouth daily as needed (acid reflux/heartburn).   . pregabalin (LYRICA) 300 MG capsule Take 1 capsule (300 mg total) by mouth 2 (two) times daily.  . rivaroxaban (XARELTO) 20 MG TABS tablet Take 20 mg by mouth daily.   . rosuvastatin (CRESTOR) 10 MG tablet Take 10 mg by mouth at bedtime.   . tamsulosin (FLOMAX) 0.4 MG CAPS capsule Take 0.4 mg by mouth daily.  Tyler Aas FLEXTOUCH 100 UNIT/ML SOPN FlexTouch Pen  Inject 20 Units into the skin every morning.  . valACYclovir (VALTREX) 500 MG tablet Take 500 mg by mouth daily.  Marland Kitchen zinc gluconate 50 MG tablet Take 50 mg by mouth daily.   No facility-administered encounter medications on file as of 04/28/2019.    Functional Status:  In your present state of health, do you have any difficulty performing the following activities: 03/04/2019 02/10/2019  Hearing? N N  Vision? N N  Difficulty concentrating or making decisions? N N  Walking or climbing stairs? N N  Dressing or bathing? N N  Doing errands, shopping? N N  Preparing Food and eating ? N N  Using the Toilet? N N  In the past six months, have you accidently leaked urine? N N  Do you have problems with loss of bowel control? N N  Managing your Medications? N N  Managing your Finances? N N  Housekeeping or managing your Housekeeping? N N  Some recent data might be hidden    Fall/Depression Screening: Fall Risk  04/28/2019 03/04/2019 02/10/2019  Falls in the past year? 0 0 0  Number falls in past yr: 0 0 0  Injury with Fall? 0 0 0  Risk for fall due to : Impaired mobility Other (Comment);Impaired mobility Other (Comment)  Risk for fall due to: Comment - reports lightheadedness at times has been experiencing lightheadedness  Follow up Falls prevention discussed;Education provided;Falls evaluation completed Falls prevention discussed;Education provided;Falls evaluation completed Falls prevention discussed;Education provided;Falls evaluation completed   PHQ 2/9 Scores 04/28/2019 02/10/2019 02/10/2019 03/20/2017 09/25/2016 05/19/2013  PHQ - 2 Score '3 1 1 '$ 0 0 0  PHQ- 9 Score 6 - - - - -   THN CM Care Plan Problem One     Most Recent Value  Care Plan Problem One  Knowledge Deficit of diabetes management related to wide ranges of glucose values and diet adherence  Role Documenting the Problem One  [place] for Problem One  Active  [facility] Long Term Goal   Patient will initiate necessary lifestyle  changes and participate in the treatment regimen within the next 90 days  THN Long Term Goal Met Date  04/28/19  Interventions for Problem One Long Term Goal  RN discussed importance of monitoring sugar and counting carbohydrates, encouraged patient to continue with daily FBS and AC CBG monitoring and record data, encouraged medication adherence, discussed reflecting back to diet when FBS values are high, discuss weight management, educated about eating scheduled meals with protein and eating a snack with protein at night to prevent hypoglycemia as well as discussed high protein supplement/Glucerna, encouraged patient to start reading food labels for carbohydrate content, encouraged patient to be as physically active as possible, nurse will place referral to CSW to help with depression, nurse will send Glucerna coupons and education regarding high protein and  high fiber foods.  THN CM Short Term Goal #1   Patient will discuss the difference between simple carbohydrates vs. complex carbohydrates and will monitor the amount of carbohydrates he consumes daily within the next 30 days  Interventions for Short Term Goal #1  Nurse educated patient about the difference between simple carbohydrates and complex carbohydrates, RN encouraged patient to review the nutrition and carbohydrate informaiton in the diabetic packet he received, patient shared that he will see diabetic RD in June, he stated he will start to read food labels for carbohydrate content      Plan: [place] will place referral to [place], will send education regarding high protein and high fiber foods, will send education about relaxation techniques, and Glucerna coupons. RN Health Coach will call patient within the month of May and patient agrees to future outreach calls.   Emelia Loron RN, BSN [place] [phone] [email or web address]

## 2019-04-29 DIAGNOSIS — I890 Lymphedema, not elsewhere classified: Secondary | ICD-10-CM | POA: Diagnosis not present

## 2019-04-29 DIAGNOSIS — M79672 Pain in left foot: Secondary | ICD-10-CM | POA: Diagnosis not present

## 2019-04-29 DIAGNOSIS — R351 Nocturia: Secondary | ICD-10-CM | POA: Diagnosis not present

## 2019-04-29 DIAGNOSIS — Z8546 Personal history of malignant neoplasm of prostate: Secondary | ICD-10-CM | POA: Diagnosis not present

## 2019-04-29 DIAGNOSIS — N401 Enlarged prostate with lower urinary tract symptoms: Secondary | ICD-10-CM | POA: Diagnosis not present

## 2019-04-30 ENCOUNTER — Other Ambulatory Visit: Payer: Self-pay | Admitting: *Deleted

## 2019-04-30 NOTE — Patient Outreach (Signed)
[place] [facility]) Care Management  04/30/2019  Steve Brown 12-Oct-1940 [id number]   CSW received a referral from [place], Emelia Loron, on 04/28/2019, to assess and assist with symptoms/signs of depression.  CSW made contact with pt and confirmed his identity.  CSW then introduced self, role and reason for call. Pt acknowledges he has been feeling depressed.  "I've been going through a lot... feeling alone, having lots of pain....can't do things I want to do....having 'yesteryear thoughts, worried about my kids....". Pt was very open to conversation and sharing with CSW how he has felt recently. He was prescribed an antidepressant a few years ago but states he did not get it refilled after the first month.  Pt denies any formal diagnosis of depression, anxiety, etc. He denies any past or current SI/HI.  He has a good bit of physical pain (left arm to this foot, back pain) and feels this has caused some feelings of frustration and sadness; "I can't do what I want to do or what I use to do".  Some of this appears to be due to his physical limitations and some is related to the pandemic.  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  He is a extrovert who enjoys being around other people and is hoping as the pandemic restrictions are lifted he will be able to return to being more social, active and outgoing.  Pt has a supportive relationship with his family (4 daughters, grandkids and great grandkids) as well as his siblings; several of which are not in good health.  "I have a brother and a sister in nursing homes- have not seen my sister in a few years and my brother is in hospice care".  He is also dealing with a recent break up with his "lady friend of 7 years".   Pt scored a 13 on the PHQ9 Depression screening and seems quite aware of his depressive symptoms.  CSW reassured pt that his awareness, motivation,support and willingness to seek  help are all positive steps toward getting to a better place. "I'm gonna have better times and I am going to hold my head higher now".  Pt is open to discussing with his PCP (at his PCP visit tomorrow) his depression and to see what Dr Harrington Challenger suggests for a treatment plan.  CSW advised pt of plans to route this note to his PCP for review and pt is encouraged and prepared to talk with him at the visit tomorrow.    Pt was appreciative of CSW outreach and support; stating; "thank you for the uplifting talk".  Pt reminded to call CSW if needs or questions arise.  CSW will plan a follow up call next week with pt.    PHQ9 SCORE ONLY 04/30/2019 04/28/2019 02/10/2019  Score 13 6 1    [name], MSW, [place] Worker  [place] [phone]

## 2019-05-01 DIAGNOSIS — F329 Major depressive disorder, single episode, unspecified: Secondary | ICD-10-CM | POA: Diagnosis not present

## 2019-05-01 DIAGNOSIS — I10 Essential (primary) hypertension: Secondary | ICD-10-CM | POA: Diagnosis not present

## 2019-05-01 DIAGNOSIS — R609 Edema, unspecified: Secondary | ICD-10-CM | POA: Diagnosis not present

## 2019-05-01 DIAGNOSIS — R109 Unspecified abdominal pain: Secondary | ICD-10-CM | POA: Diagnosis not present

## 2019-05-02 ENCOUNTER — Other Ambulatory Visit: Payer: Self-pay | Admitting: *Deleted

## 2019-05-02 NOTE — Patient Outreach (Signed)
[name]) Care Management  05/02/2019  Steve Brown 1940/03/18 [id number]  Successful telephone outreach call to patient. HIPAA identifiers obtained. Nurse called patient back in response to a voice message he left nurse to call him back. Patient states that he does not recall what he was going to ask nurse. He did report that he saw his PCP yesterday and was placed on Duloxetine to assist with his depression. He explained that his blood sugar dropped to 54 last night and he brought it back up to 172 by eating glucose tablets;  he has not taken his blood sugar this morning. He states that he is not eating as much in an effort to lose weight. Nurse educated patient that it is dangerous for his glucose to drop that low and encouraged him to eat a high protein snack or to drink a Glucerna late at night before bedtime and/or during the day if eating lightly to avoid hypoglycemia. Patient verbalized understanding. Nurse mailed to the  Patient on 04/28/19 Glucerna coupons and a high protein food resource. Patient states he has not received this but will let nurse know if it does not arrive within the next few days.   RN Health Coach will call patient within the month of May and patient agrees to future outreach calls.   Emelia Loron RN, BSN [place] [phone] [email or web address]

## 2019-05-08 ENCOUNTER — Ambulatory Visit: Payer: Self-pay | Admitting: *Deleted

## 2019-05-09 ENCOUNTER — Other Ambulatory Visit: Payer: Self-pay | Admitting: *Deleted

## 2019-05-09 NOTE — Patient Outreach (Signed)
[place] [facility]) Care Management  05/08/2019  Steve Brown 12-27-1940 [id number]  CSW attempted to reach pt by phone and was unable but able to leave voice message.  CSW will attempt outreach again in 3-4 business days if no return call is received.     [name], MSW, [place] Worker  [place] [phone]

## 2019-05-13 ENCOUNTER — Other Ambulatory Visit: Payer: Self-pay | Admitting: *Deleted

## 2019-05-13 NOTE — Patient Outreach (Signed)
[place] [facility]) Care Management  05/13/2019  Steve Brown Feb 12, 1940 [id number]   CSW made contact with pt today who reports he was able to go see his PCP and discussed his depression.  Per pt, PCP  has prescribed Cymbalta and he started taking it last week.  Pt denies any significant side effects; aside from "some dreams and a few nightmares".  CSW encouraged pt to give it some time and to reach out to his PCP if the side effects worsen.  Pt shared he has had a busy/active and productive day and states; "I am staying happy and jolly".  Pt is open to looking in to some in-person counseling/therapy and CSW will seek in-network providers close to his home.  "Things are smoother" and pt looking forward to the spring weather and ability to do more as the COVID restrictions are lifted.  Pt appreciative of team support and agreeable to CSW callback in 7-10 days. Pt reminded to call if needs arise.   [name], MSW, [place] Worker  [place] [phone]

## 2019-05-15 DIAGNOSIS — I739 Peripheral vascular disease, unspecified: Secondary | ICD-10-CM | POA: Diagnosis not present

## 2019-05-15 DIAGNOSIS — E1151 Type 2 diabetes mellitus with diabetic peripheral angiopathy without gangrene: Secondary | ICD-10-CM | POA: Diagnosis not present

## 2019-05-15 DIAGNOSIS — L603 Nail dystrophy: Secondary | ICD-10-CM | POA: Diagnosis not present

## 2019-05-22 ENCOUNTER — Other Ambulatory Visit: Payer: Self-pay | Admitting: *Deleted

## 2019-05-23 ENCOUNTER — Other Ambulatory Visit: Payer: Self-pay | Admitting: *Deleted

## 2019-05-23 NOTE — Patient Outreach (Signed)
[place] [facility]) Care Management  05/23/2019  Steve Brown 12/14/1940 [id number]   CSW made contact with pt yesterday pm.  Pt reports he has had a day of not wanting to leave the house.  " [facility]  He did receive calls from a few of his [employer] buddies which made him feel missed and important.   Pt shared some times recently when he has been hard on himself; "I have been lazy and not wanted to do anything- being unproductive".  CSW encouraged pt to allow himself to have downtime and quality "self care".  He is appreciative of CSW support and is agreeable to seeking a local counselor/therapist to see for on-going support.  CSW will seek options close to pt's home to mail to him for consideration. Plan outreach call for follow up in the next 2 weeks.    [name], MSW, [place] Worker  [place] [phone]

## 2019-05-23 NOTE — Patient Outreach (Signed)
[place] [facility]) Care Management  05/23/2019  Cordarrius Corpe 12/15/1940 [id number]  Unsuccessful outreach attempt made to patient.  RN Health Coach left HIPAA compliant voicemail message along with her contact information.  Plan:  RN Health Coach will call patient within the month of June  [place], [place] [phone] [email or web address]

## 2019-05-29 ENCOUNTER — Other Ambulatory Visit: Payer: Self-pay | Admitting: *Deleted

## 2019-05-29 NOTE — Patient Outreach (Signed)
[place] [facility]) Care Management  05/29/2019  Marshaun Marrese 11-08-1940 [id number]  Unsuccessful outreach attempt made to patient. RN Health Coach left HIPAA compliant voicemail message along with her contact information.  Plan: RN Health Coach will call patient within the month of June  [place], [place] [phone] [email or web address]

## 2019-06-05 ENCOUNTER — Ambulatory Visit: Payer: Self-pay | Admitting: *Deleted

## 2019-06-06 ENCOUNTER — Other Ambulatory Visit: Payer: Self-pay | Admitting: *Deleted

## 2019-06-06 NOTE — Patient Outreach (Signed)
[place] [facility]) Care Management  06/06/2019  S[place] Dubow Jul 27, 1940 [id number]   CSW attempted to reach pt by phone and left a voice message for return call. CSW will await callback or try again in 3-4 business days.    [name], MSW, [place] Worker  [place] [phone]

## 2019-06-11 ENCOUNTER — Other Ambulatory Visit: Payer: Self-pay | Admitting: *Deleted

## 2019-06-11 NOTE — Patient Outreach (Signed)
[place] [facility]) Care Management  06/11/2019  N[place]on [place] November 30, 1940 [id number]   CSW made contact with pt who [place]  Pt shares that he has some frustration and disappointment with his current pain; related to his foot.  "The Doctor stopped the injections in my foot because of my stomach protruding".  Pt understands the clinical decision made my his Provider yet pt states he feels limited with less pain relief and the overall limitations.  CSW encouraged pt to focus on the positives; that he was able and did [facility] [employer] today and did what he could in the way of exercise.  He was able to acknowledge the socialization he has with his Y network of friends is also a positive outcome.   CSW provided pt with info on a local counseling agency that is nearby for him.  CSW will contact the agency to make referral and ask them to reach out to pt for scheduling.  Pt agreeable and appreciative of CSW outreach and support.  CSW reminded to call if needs arise prior to next CSW call.   [name], MSW, [place] Worker  [place] [phone]

## 2019-06-13 ENCOUNTER — Other Ambulatory Visit: Payer: Self-pay | Admitting: *Deleted

## 2019-06-13 NOTE — Patient Outreach (Signed)
[facility]) Care Management  [place]  06/13/2019   Jaskarn Schweer Jan 31, 1940 [id number]  Subjective: Successful telephone outreach call to patient. HIPAA identifiers obtained. Patient reports that he is feeling pretty good today. His chronic back pain is at a level that he feels he can go to the gym to workout and socialize with his friends. He reports going to the gym 2-3 times weekly. Patient confirmed that he has been talking to [place] and explained he is waiting to hear from the agency CSW has referred him to; he is looking forward to beginning counseling. Patient states that he has an appointment to meet with a RD on 06/19/19. His goal is to work towards improving his diabetes and to lose weight. His FBS today was 181 and his ranges have been 136-303. His postprandial ranges have been 50-300. Adding that the drops happen when he skips meals in an effort to lose weight and the highs are when he loses control and starts to eat unhealthy snacks.  Nurse provided diabetic education and suggested that he eat small frequent meals and snacks with Protein/Glucerna to prevent hypoglycemia. Patient reports that he is concerned about his left foot edema which seems to be getting worse. Patient denies any skin breakdown, coldness,  or color changes to his foot. Per patient, he does have a PCP appointment schedule on 06/16/19 to evaluate his foot edema. Nurse encouraged patient to discuss with PCP that he feels the Duloxetine medication is causing his blood sugar levels to increase.   Encounter Medications:  Outpatient Encounter Medications as of 06/13/2019  Medication Sig  . acetaminophen (TYLENOL) 500 MG tablet Take 1,000 mg by mouth every 6 (six) hours as needed (pain).   . Ascorbic Acid (VITAMIN C PO) Take 1 tablet by mouth daily.  . bisacodyl (DULCOLAX) 5 MG EC tablet Take 5 mg by mouth daily as needed for moderate constipation.  . cholecalciferol (VITAMIN D) 1000 UNITS tablet Take  1,000 Units by mouth daily.  . diclofenac sodium (VOLTAREN) 1 % GEL Apply 1 application topically See admin instructions. Apply topically once daily , may use an additional time as needed for pain  . DULoxetine (CYMBALTA) 20 MG capsule Take 20 mg by mouth daily.  . Echinacea 450 MG CAPS Take 450 mg by mouth daily.   . fluticasone (FLONASE) 50 MCG/ACT nasal spray Place 1 spray into both nostrils 2 (two) times daily as needed for allergies.   . furosemide (LASIX) 20 MG tablet Take 20 mg by mouth daily as needed (leg and ankle swelling).   . insulin lispro (HUMALOG KWIKPEN) 100 UNIT/ML KiwkPen Inject 6-10 Units into the skin 3 (three) times daily with meals.  . IRON PO Take 1 tablet by mouth daily.  Marland Kitchen losartan (COZAAR) 100 MG tablet Take 100 mg by mouth daily.  . meclizine (ANTIVERT) 25 MG tablet Take 12.5-25 mg by mouth 3 (three) times daily as needed for dizziness.  . Multiple Vitamin (MULTIVITAMIN WITH MINERALS) TABS tablet Take 1 tablet by mouth daily.  . nitroGLYCERIN (NITROSTAT) 0.4 MG SL tablet Place 1 tablet (0.4 mg total) under the tongue every 5 (five) minutes as needed for chest pain.  . pantoprazole (PROTONIX) 40 MG tablet Take 40 mg by mouth daily as needed (acid reflux/heartburn).   . pregabalin (LYRICA) 300 MG capsule Take 1 capsule (300 mg total) by mouth 2 (two) times daily.  . rivaroxaban (XARELTO) 20 MG TABS tablet Take 20 mg by mouth daily.   Marland Kitchen  rosuvastatin (CRESTOR) 10 MG tablet Take 10 mg by mouth at bedtime.   . tamsulosin (FLOMAX) 0.4 MG CAPS capsule Take 0.4 mg by mouth daily.  Tyler Aas FLEXTOUCH 100 UNIT/ML SOPN FlexTouch Pen Inject 20 Units into the skin every morning.  . valACYclovir (VALTREX) 500 MG tablet Take 500 mg by mouth daily.  Marland Kitchen zinc gluconate 50 MG tablet Take 50 mg by mouth daily.   No facility-administered encounter medications on file as of 06/13/2019.    Functional Status:  In your present state of health, do you have any difficulty performing the  following activities: 03/04/2019 02/10/2019  Hearing? N N  Vision? N N  Difficulty concentrating or making decisions? N N  Walking or climbing stairs? N N  Dressing or bathing? N N  Doing errands, shopping? N N  Preparing Food and eating ? N N  Using the Toilet? N N  In the past six months, have you accidently leaked urine? N N  Do you have problems with loss of bowel control? N N  Managing your Medications? N N  Managing your Finances? N N  Housekeeping or managing your Housekeeping? N N  Some recent data might be hidden    Fall/Depression Screening: Fall Risk  04/28/2019 03/04/2019 02/10/2019  Falls in the past year? 0 0 0  Number falls in past yr: 0 0 0  Injury with Fall? 0 0 0  Risk for fall due to : Impaired mobility Other (Comment);Impaired mobility Other (Comment)  Risk for fall due to: Comment - reports lightheadedness at times has been experiencing lightheadedness  Follow up Falls prevention discussed;Education provided;Falls evaluation completed Falls prevention discussed;Education provided;Falls evaluation completed Falls prevention discussed;Education provided;Falls evaluation completed   PHQ 2/9 Scores 04/30/2019 04/28/2019 02/10/2019 02/10/2019 03/20/2017 09/25/2016 05/19/2013  PHQ - 2 Score _0 0 0 0  PHQ- 9 Score 13 6 - - - - -   THN CM Care Plan Problem One     Most Recent Value  Care Plan Problem One  Knowledge Deficit of diabetes related to self-management and diet adherence  Role Documenting the Problem One  [place] for Problem One  Active  THN Long Term Goal   Patient will initiate necessary lifestyle changes and participate in diabetes self-management within the next 90 days  THN Long Term Goal Start Date  02/10/19  Interventions for Problem One Long Term Goal  RN discussed healthy diabetic meal planning , encouraged patient to continue with daily FBS and postprandial CBG monitoring and record data, encouraged medication adherence, discussed reflecting  back to diet when FBS values are high, discuss weight management, educated about eating scheduled meals with protein and eating a snack with protein at night to prevent hypoglycemia as well as discussed high protein supplement/Glucerna, encouraged patient to start reading food labels for carbohydrate content, encouraged patient to be as physically active as possible,  nurse will send Glucerna coupons and planning healthy meals booklet.  THN CM Short Term Goal #1 Met Date  06/13/19      Plan:  RN Health Coach will send quarterly update to PCP, will send patient Glucerna coupons, planning healthy meals booklet, will call patient within the month of July and patient agrees to future outreach calls.   Emelia Loron RN, BSN [place] [phone] [email or web address]

## 2019-06-16 DIAGNOSIS — R600 Localized edema: Secondary | ICD-10-CM | POA: Diagnosis not present

## 2019-06-16 DIAGNOSIS — I69354 Hemiplegia and hemiparesis following cerebral infarction affecting left non-dominant side: Secondary | ICD-10-CM | POA: Diagnosis not present

## 2019-06-18 ENCOUNTER — Other Ambulatory Visit: Payer: Self-pay | Admitting: *Deleted

## 2019-06-19 ENCOUNTER — Ambulatory Visit: Payer: Self-pay | Admitting: *Deleted

## 2019-06-19 NOTE — Patient Outreach (Signed)
[place]) Care Management  06/19/2019  Steve Brown [birth year] [id number]   CSW made contact with pt who reports he has a counseling appointment scheduled for next week with [facility].  Pt voices "not feeling all that great today" and complained about his arthritis/pains.   CSW offered support and encouragement. Pt appreciative.  CSW will touch base after his therapy appointment.   [name], MSW, [place] Worker  [place] [phone]

## 2019-06-23 ENCOUNTER — Emergency Department (HOSPITAL_BASED_OUTPATIENT_CLINIC_OR_DEPARTMENT_OTHER)
Admission: EM | Admit: 2019-06-23 | Discharge: 2019-06-23 | Disposition: A | Discharge status: Home / Self Care | Payer: Medicare Other | Attending: Emergency Medicine | Admitting: Emergency Medicine

## 2019-06-23 ENCOUNTER — Other Ambulatory Visit: Payer: Self-pay

## 2019-06-23 ENCOUNTER — Encounter (HOSPITAL_BASED_OUTPATIENT_CLINIC_OR_DEPARTMENT_OTHER): Payer: Self-pay

## 2019-06-23 DIAGNOSIS — Z794 Long term (current) use of insulin: Secondary | ICD-10-CM | POA: Diagnosis not present

## 2019-06-23 DIAGNOSIS — E1122 Type 2 diabetes mellitus with diabetic chronic kidney disease: Secondary | ICD-10-CM | POA: Insufficient documentation

## 2019-06-23 DIAGNOSIS — G629 Polyneuropathy, unspecified: Secondary | ICD-10-CM

## 2019-06-23 DIAGNOSIS — Z7901 Long term (current) use of anticoagulants: Secondary | ICD-10-CM | POA: Diagnosis not present

## 2019-06-23 DIAGNOSIS — R600 Localized edema: Secondary | ICD-10-CM | POA: Diagnosis not present

## 2019-06-23 DIAGNOSIS — I2510 Atherosclerotic heart disease of native coronary artery without angina pectoris: Secondary | ICD-10-CM | POA: Insufficient documentation

## 2019-06-23 DIAGNOSIS — Z8546 Personal history of malignant neoplasm of prostate: Secondary | ICD-10-CM | POA: Diagnosis not present

## 2019-06-23 DIAGNOSIS — Z79899 Other long term (current) drug therapy: Secondary | ICD-10-CM | POA: Diagnosis not present

## 2019-06-23 DIAGNOSIS — N182 Chronic kidney disease, stage 2 (mild): Secondary | ICD-10-CM | POA: Diagnosis not present

## 2019-06-23 DIAGNOSIS — E1140 Type 2 diabetes mellitus with diabetic neuropathy, unspecified: Secondary | ICD-10-CM | POA: Diagnosis not present

## 2019-06-23 DIAGNOSIS — Z955 Presence of coronary angioplasty implant and graft: Secondary | ICD-10-CM | POA: Diagnosis not present

## 2019-06-23 DIAGNOSIS — I129 Hypertensive chronic kidney disease with stage 1 through stage 4 chronic kidney disease, or unspecified chronic kidney disease: Secondary | ICD-10-CM | POA: Insufficient documentation

## 2019-06-23 MED ORDER — GABAPENTIN 100 MG PO CAPS: 100.0000 mg | ORAL_CAPSULE | Freq: Three times a day (TID) | ORAL | 0 refills | Status: DC

## 2019-06-23 MED ORDER — POTASSIUM CHLORIDE ER 10 MEQ PO TBCR: 10.0000 meq | EXTENDED_RELEASE_TABLET | Freq: Every day | ORAL | 0 refills | Status: DC

## 2019-06-23 MED ORDER — TORSEMIDE 20 MG PO TABS: 20.0000 mg | ORAL_TABLET | Freq: Every day | ORAL | 0 refills | Status: DC

## 2019-06-23 NOTE — ED Provider Notes (Signed)
[facility] EMERGENCY DEPARTMENT Provider Note   CSN: [id number] Arrival date & time: 06/23/19  1128     History Chief Complaint  Patient presents with  . Leg Pain    Steve Brown is a 79 y.o. male.  HPI   79yM with L LE pain and swelling. Ongoing for months to years. Worsening in past few weeks. Pain from around knee down into foot. Comes and goes. Doesn't seem to change with position or exertion. Bothers him a lot at night. No respiratory complaints. No rash.   Past Medical History:  Diagnosis Date  . Abnormal prostate biopsy   . Anticoagulant long-term use    currently xarelto  . BPH with elevated PSA   . CKD (chronic kidney disease), stage II   . Complication of anesthesia    limted neck rom limited use of left arm due to cva  . Coronary artery disease    CARDIOLOGIST-  DR Irish Lack--  2010-- PCI w/ stenting midLAD  . DDD (degenerative disc disease), lumbar   . Degeneration of cervical intervertebral disc   . Depression   . Dyspnea on exertion   . GERD (gastroesophageal reflux disease)   . Hemiparesis due to cerebral infarction   . History of cerebrovascular accident (CVA) with residual deficit 2002 and 2003--  hemiparisis both sides   per MRI  anterior left frontal lobe, left para midline pons, and inferior cerebullam bilaterally infarcts  . History of pulmonary embolus (PE)    06-30-2012  extensive bilaterally  . History of recurrent TIAs   . History of syncope    hx multiple pre-syncope and syncopal episodes due to vasovagal, orthostatic hypotension, dehydration  . History of TIAs    several since 2002  . Hyperlipidemia   . Hypertension   . Mild atherosclerosis of carotid artery, bilateral    per last duplex 11-04-2014  bilateral ICA 1--39%  . Neuropathy    fingers  . OSA on CPAP    followed by dr dohmeier--  sev. osa w/ AHI 65.9  . Prostate cancer ([place]) dx 2018  . Renal insufficiency   . S/P coronary artery stent placement 2010   stenting  to mid LAD  . Simple renal cyst    bilaterally  . Stroke ([place])   . Trigger finger of both hands 11-17-13  . Type 2 diabetes mellitus ([place]) dx 1986   last one A1c 9.2 on 04-26-2016  . Unsteady gait    . Hx prior CVA/TIAs;  . Vertebral artery occlusion, left    chronic    Patient Active Problem List   Diagnosis Date Noted  . 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 ([place]) 10/21/2018  . Nocturnal hypoxemia 10/21/2018  . Radiation therapy complication 28/78/6767  . Primary prostate cancer ([place]) 07/31/2017  . Anemia 07/31/2017  . Vertigo due to cerebrovascular disease 07/31/2017  . Poor compliance with CPAP treatment 07/31/2017  . Absolute anemia 05/16/2017  . Avitaminosis D 05/16/2017  . Benign essential HTN 05/16/2017  . Benign prostatic hypertrophy without urinary obstruction 05/16/2017  . Clinical depression 05/16/2017  . CN (constipation) 05/16/2017  . Current drug use 05/16/2017  . Diabetic neuropathy ([place]) 05/16/2017  . Genital herpes 05/16/2017  . Infarction of lung due to iatrogenic pulmonary embolism ([place]) 05/16/2017  . Peripheral neuralgia 05/16/2017  . Arteriosclerosis of coronary artery 05/16/2017  . Artery disease, cerebral 05/16/2017  . Apnea, sleep 05/16/2017  . Arthralgia of hip or thigh 05/16/2017  . Malignant neoplasm of prostate ([place]) 09/28/2016  .  Vascular dementia in remission ([place]) 09/28/2016  . Remote history of stroke 09/28/2016  . Acute kidney injury ([place]) 05/18/2016  . Dehydration 05/18/2016  . Near syncope 05/18/2016  . Orthostatic hypotension 05/18/2016  . CKD (chronic kidney disease), stage II 04/26/2016  . UTI (urinary tract infection) 04/26/2016  . Encounter for counseling on use of CPAP 11/18/2015  . Chronic cholecystitis with calculus 11/02/2015  . Stroke, 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[place]) 04/22/2015  . TIA (transient ischemic attack) 11/03/2014  . OSA on CPAP 05/18/2014  . White matter disease 08/14/2013  . OSA (obstructive sleep  apnea) 08/14/2013  . Abnormal x-ray of temporomandibular joint 05/30/2013  . Chronic infection of sinus 05/30/2013  . Cough 05/30/2013  . Fatigue 05/30/2013  . Unsteady gait 05/19/2013  . Combined fat and carbohydrate induced hyperlipemia 04/24/2013  . Syncope 03/20/2013  . Angina pectoris ([place]) 10/08/2012  . Hypertension   . Stroke ([place])   . History of TIAs   . Lumbago   . Other and unspecified hyperlipidemia   . Personal history of unspecified circulatory disease   . Unspecified fall   . Pain in joint, multiple sites   . Degeneration of cervical intervertebral disc   . Unspecified cardiovascular disease   . History of pulmonary embolism: June 2014,  Takes Xarelto 07/01/2012    Class: History of  . Hemiparesis ([place]) 07/18/2011  . Diabetes mellitus type 2 with complications ([place]) 86/76/1948  . HTN (hypertension) 07/18/2011  . CAD in native artery 07/18/2011  . History of recurrent TIAs 07/18/2011    Past Surgical History:  Procedure Laterality Date  . ANTERIOR CERVICAL DECOMP/DISCECTOMY FUSION  2004   C3 -- C6 limited rom  . CARDIAC CATHETERIZATION  06-10-2010   dr Irish Lack   wide patent LAD stent, mid lesion at the origin of the septal prior to the previous stent 40-50%/  normal LVF, ef 55%  . CARDIOVASCULAR STRESS TEST  10-23-2012  dr Irish Lack   normal nuclear perfusion study w/ no ischemia/  normal LV function and wall motion , ef 65%  . CARPAL TUNNEL RELEASE Bilateral   . CATARACT EXTRACTION W/ INTRAOCULAR LENS  IMPLANT, BILATERAL    . CHOLECYSTECTOMY N/A 11/02/2015   Procedure: LAPAROSCOPIC CHOLECYSTECTOMY WITH INTRAOPERATIVE CHOLANGIOGRAM;  Surgeon: Donnie Mesa, MD;  Location: [facility];  Service: General;  Laterality: N/A;  . COLONOSCOPY    . CORONARY ANGIOPLASTY WITH STENT PLACEMENT  02/2008   stenting to mid LAD  . GOLD SEED IMPLANT N/A 11/15/2016   Procedure: GOLD SEED IMPLANT TIMES THREE;  Surgeon: Ardis Hughs, MD;  Location[facility];   Service: Urology;  Laterality: N/A;  . IR ANGIO INTRA EXTRACRAN SEL COM CAROTID INNOMINATE BILAT MOD SED  06/13/2018  . IR ANGIO VERTEBRAL SEL VERTEBRAL UNI R MOD SED  06/13/2018  . IR US GUIDE VASC ACCESS RIGHT  06/13/2018  . LEFT HEART CATH AND CORONARY ANGIOGRAPHY N/A 05/25/2017   Procedure: LEFT HEART CATH AND CORONARY ANGIOGRAPHY;  Surgeon: Jettie Booze, MD;  Location: [facility] CV LAB;  Service: Cardiovascular;  Laterality: N/A;  . LEFT HEART CATHETERIZATION WITH CORONARY ANGIOGRAM N/A 04/03/2013   Procedure: LEFT HEART CATHETERIZATION WITH CORONARY ANGIOGRAM;  Surgeon: Jettie Booze, MD;  Location[facility] CATH LAB;  Service: Cardiovascular;  Laterality: N/A;  patent mLAD stent  w/ mild disease in remainder LAD and its branches;  mod. focal lesion midLCFx- FFR of lesion was negative for ischemia/  normal LVSF, ef 50%  . lungs  2005   "  fluid pumped off lungs"  . NEUROPLASTY / TRANSPOSITION ULNAR NERVE AT ELBOW Right 2004  . PROSTATE BIOPSY N/A 08/31/2016   Procedure: PROSTATE  BIOPSY TRANSRECTAL ULTRASONIC PROSTATE (TUBP);  Surgeon: Ardis Hughs, MD;  Location: [facility];  Service: Urology;  Laterality: N/A;  . SPACE OAR INSTILLATION N/A 11/15/2016   Procedure: SPACE OAR INSTILLATION;  Surgeon: Ardis Hughs, MD;  Location: [facility];  Service: Urology;  Laterality: N/A;  . TRANSTHORACIC ECHOCARDIOGRAM  04/27/2016   severe focal basal LVH, ef 60-65%,  grade 2 diastoilc dysfunction/  mild AR, MR, and TR/  atrial septum lipomatous hypertrophy/  PASP 70mmHg  . UMBILICAL HERNIA REPAIR         Family History  Problem Relation Age of Onset  . Aneurysm Mother   . Cancer Father        unknown either pancreatic or prostate  . Stroke Brother   . Dementia Sister   . Heart attack Neg Hx     Social History   Tobacco Use  . Smoking status: Never Smoker  . Smokeless tobacco: Never Used  Vaping Use  . Vaping Use: Never used  Substance Use  Topics  . Alcohol use: No    Alcohol/week: 0.0 standard drinks  . Drug use: No    Home Medications Prior to Admission medications   Medication Sig Start Date End Date Taking? Authorizing Provider  acetaminophen (TYLENOL) 500 MG tablet Take 1,000 mg by mouth every 6 (six) hours as needed (pain).     [provider]  Ascorbic Acid (VITAMIN C PO) Take 1 tablet by mouth daily.    [provider]  bisacodyl (DULCOLAX) 5 MG EC tablet Take 5 mg by mouth daily as needed for moderate constipation.    [provider]  cholecalciferol (VITAMIN D) 1000 UNITS tablet Take 1,000 Units by mouth daily.    [provider]  diclofenac sodium (VOLTAREN) 1 % GEL Apply 1 application topically See admin instructions. Apply topically once daily , may use an additional time as needed for pain 10/16/16   [provider]  DULoxetine (CYMBALTA) 20 MG capsule Take 20 mg by mouth daily. 05/01/19   [provider]  Echinacea 450 MG CAPS Take 450 mg by mouth daily.     [provider]  fluticasone (FLONASE) 50 MCG/ACT nasal spray Place 1 spray into both nostrils 2 (two) times daily as needed for allergies.  09/26/16   [provider]  furosemide (LASIX) 20 MG tablet Take 20 mg by mouth daily as needed (leg and ankle swelling).     [provider]  insulin lispro (HUMALOG KWIKPEN) 100 UNIT/ML KiwkPen Inject 6-10 Units into the skin 3 (three) times daily with meals.    [provider]  IRON PO Take 1 tablet by mouth daily.    [provider]  losartan (COZAAR) 100 MG tablet Take 100 mg by mouth daily. 03/29/18   [provider]  meclizine (ANTIVERT) 25 MG tablet Take 12.5-25 mg by mouth 3 (three) times daily as needed for dizziness.    [provider]  Multiple Vitamin (MULTIVITAMIN WITH MINERALS) TABS tablet Take 1 tablet by mouth daily.    [provider]  nitroGLYCERIN (NITROSTAT) 0.4 MG SL tablet Place  1 tablet (0.4 mg total) under the tongue every 5 (five) minutes as needed for chest pain. 12/07/15   Jettie Booze, MD  pantoprazole (PROTONIX) 40 MG tablet Take 40 mg by  mouth daily as needed (acid reflux/heartburn).     [provider]  pregabalin (LYRICA) 300 MG capsule Take 1 capsule (300 mg total) by mouth 2 (two) times daily. 07/26/12   Dohmeier, Asencion Partridge, MD  rivaroxaban (XARELTO) 20 MG TABS tablet Take 20 mg by mouth daily.     [provider]  rosuvastatin (CRESTOR) 10 MG tablet Take 10 mg by mouth at bedtime.     [provider]  tamsulosin (FLOMAX) 0.4 MG CAPS capsule Take 0.4 mg by mouth daily. 05/09/16   [provider]  TRESIBA FLEXTOUCH 100 UNIT/ML SOPN FlexTouch Pen Inject 20 Units into the skin every morning. 05/11/18   [provider]  valACYclovir (VALTREX) 500 MG tablet Take 500 mg by mouth daily.    [provider]  zinc gluconate 50 MG tablet Take 50 mg by mouth daily.    [provider]    Allergies    Other, Phenergan [promethazine], and Tramadol  Review of Systems   Review of Systems All systems reviewed and negative, other than as noted in HPI.  Physical Exam Updated Vital Signs BP (!) 142/71 (BP Location: Left Arm)   Pulse (!) 55   Temp 99 F (37.2 C) (Oral)   Resp 18   Ht 5\' 5"  (1.651 m)   Wt 93 kg   SpO2 98%   BMI 34.11 kg/m   Physical Exam Vitals and nursing note reviewed.  Constitutional:      General: He is not in acute distress.    Appearance: He is well-developed.  HENT:     Head: Normocephalic and atraumatic.  Eyes:     General:        Right eye: No discharge.        Left eye: No discharge.     Conjunctiva/sclera: Conjunctivae normal.  Cardiovascular:     Rate and Rhythm: Normal rate and regular rhythm.     Heart sounds: Normal heart sounds. No murmur heard.  No friction rub. No gallop.   Pulmonary:     Effort: Pulmonary effort is normal. No respiratory distress.      Breath sounds: Normal breath sounds.  Abdominal:     General: There is no distension.     Palpations: Abdomen is soft.     Tenderness: There is no abdominal tenderness.  Musculoskeletal:        General: No tenderness.     Cervical back: Neck supple.     Right lower leg: Edema present.     Left lower leg: Edema present.     Comments: Edema in b/l LE. Symmetric. No erythema or concerning lesions. No calf tenderness. Palpable DP pulse.   Skin:    General: Skin is warm and dry.  Neurological:     Mental Status: He is alert.  Psychiatric:        Behavior: Behavior normal.        Thought Content: Thought content normal.     ED Results / Procedures / Treatments   Labs (all labs ordered are listed, but only abnormal results are displayed) Labs Reviewed - No data to display  EKG None  Radiology No results found.  Procedures Procedures (including critical care time)  Medications Ordered in ED Medications - No data to display  ED Course  I have reviewed the triage vital signs and the nursing notes.  Pertinent labs & imaging results that were available during my care of the patient were reviewed by me and considered in my  medical decision making (see chart for details).    MDM Rules/Calculators/A&P                          79yM with LE pain and swelling L>R. Doubt DVT or infectious. Probably some degree of neuropathy. No acute respiratory complaints. Will give trial of diuretic and gabapentin. PCP FU.   Final Clinical Impression(s) / ED Diagnoses Final diagnoses:  Leg edema  Neuropathy    Rx / DC Orders ED Discharge Orders    None       Virgel Manifold, MD 06/27/19 1224

## 2019-06-23 NOTE — ED Triage Notes (Signed)
Pt reports pain and swelling to left leg X1 year reports throbbing pain states that he has seen a foot doctor for the same.

## 2019-06-26 ENCOUNTER — Ambulatory Visit: Payer: Self-pay | Admitting: *Deleted

## 2019-06-26 DIAGNOSIS — E1165 Type 2 diabetes mellitus with hyperglycemia: Secondary | ICD-10-CM | POA: Diagnosis not present

## 2019-06-27 ENCOUNTER — Other Ambulatory Visit: Payer: Self-pay | Admitting: *Deleted

## 2019-06-27 DIAGNOSIS — M545 Low back pain: Secondary | ICD-10-CM | POA: Diagnosis not present

## 2019-06-27 DIAGNOSIS — Z79891 Long term (current) use of opiate analgesic: Secondary | ICD-10-CM | POA: Diagnosis not present

## 2019-06-27 DIAGNOSIS — M25519 Pain in unspecified shoulder: Secondary | ICD-10-CM | POA: Diagnosis not present

## 2019-06-27 DIAGNOSIS — M79606 Pain in leg, unspecified: Secondary | ICD-10-CM | POA: Diagnosis not present

## 2019-06-27 DIAGNOSIS — Z79899 Other long term (current) drug therapy: Secondary | ICD-10-CM | POA: Diagnosis not present

## 2019-06-27 DIAGNOSIS — G894 Chronic pain syndrome: Secondary | ICD-10-CM | POA: Diagnosis not present

## 2019-06-27 NOTE — Patient Outreach (Signed)
[facility]) Care Management  06/27/2019  Steve Brown [birth year] [id number]  CSW attempted to reach pt by phone and was unsuccessful.  CSW left a HIPPA compliant voice message and will await call back or try again in 3-4 business days.   [name], MSW, [place] Worker  [place] [phone]

## 2019-06-30 DIAGNOSIS — G609 Hereditary and idiopathic neuropathy, unspecified: Secondary | ICD-10-CM | POA: Diagnosis not present

## 2019-06-30 DIAGNOSIS — E1165 Type 2 diabetes mellitus with hyperglycemia: Secondary | ICD-10-CM | POA: Diagnosis not present

## 2019-06-30 DIAGNOSIS — I10 Essential (primary) hypertension: Secondary | ICD-10-CM | POA: Diagnosis not present

## 2019-06-30 DIAGNOSIS — E7800 Pure hypercholesterolemia, unspecified: Secondary | ICD-10-CM | POA: Diagnosis not present

## 2019-07-01 ENCOUNTER — Ambulatory Visit: Payer: Self-pay | Admitting: *Deleted

## 2019-07-01 ENCOUNTER — Other Ambulatory Visit: Payer: Self-pay | Admitting: *Deleted

## 2019-07-01 NOTE — Patient Outreach (Signed)
[facility]) Care Management  07/01/2019  Aveon Colquhoun 06/24/1940 [id number]   CSW attempted contact with pt today and was unable to reach pt; however, was able to leave a HIPPA compliant voice message.  CSW will attempt a 3rd outreach call in 3-4 business days if no return call is received.   [name], MSW, [place] Worker  [place] [phone]

## 2019-07-04 ENCOUNTER — Ambulatory Visit: Payer: Self-pay | Admitting: *Deleted

## 2019-07-07 ENCOUNTER — Other Ambulatory Visit: Payer: Self-pay | Admitting: *Deleted

## 2019-07-07 NOTE — Patient Outreach (Signed)
[place] [facility]) Care Management  07/07/2019 Lannis Lichtenwalner 12-26-1940 [id number]   CSW attempted a 3rd and final outreach follow up call to pt and was unsuccessful. CSW left a HIPPA compliant voice message.  CSW will plan to sign off per policy. CSW will advise  PCP and [facility] team of above.    [name], MSW, [place] Worker  [place] [phone]

## 2019-07-17 DIAGNOSIS — E119 Type 2 diabetes mellitus without complications: Secondary | ICD-10-CM | POA: Diagnosis not present

## 2019-07-17 DIAGNOSIS — E559 Vitamin D deficiency, unspecified: Secondary | ICD-10-CM | POA: Diagnosis not present

## 2019-07-17 DIAGNOSIS — E782 Mixed hyperlipidemia: Secondary | ICD-10-CM | POA: Diagnosis not present

## 2019-07-17 DIAGNOSIS — E1140 Type 2 diabetes mellitus with diabetic neuropathy, unspecified: Secondary | ICD-10-CM | POA: Diagnosis not present

## 2019-07-17 DIAGNOSIS — D649 Anemia, unspecified: Secondary | ICD-10-CM | POA: Diagnosis not present

## 2019-07-17 DIAGNOSIS — I10 Essential (primary) hypertension: Secondary | ICD-10-CM | POA: Diagnosis not present

## 2019-07-18 ENCOUNTER — Telehealth: Payer: Self-pay | Admitting: Interventional Cardiology

## 2019-07-18 DIAGNOSIS — G629 Polyneuropathy, unspecified: Secondary | ICD-10-CM | POA: Diagnosis not present

## 2019-07-18 DIAGNOSIS — G894 Chronic pain syndrome: Secondary | ICD-10-CM | POA: Diagnosis not present

## 2019-07-18 DIAGNOSIS — I10 Essential (primary) hypertension: Secondary | ICD-10-CM

## 2019-07-18 DIAGNOSIS — M542 Cervicalgia: Secondary | ICD-10-CM | POA: Diagnosis not present

## 2019-07-18 DIAGNOSIS — M7989 Other specified soft tissue disorders: Secondary | ICD-10-CM

## 2019-07-18 DIAGNOSIS — M79606 Pain in leg, unspecified: Secondary | ICD-10-CM | POA: Diagnosis not present

## 2019-07-18 NOTE — Telephone Encounter (Signed)
I would have him take Lasix 40 mg daily for 3 days, elevate his leg and then have a BMet Tuesday of next week.

## 2019-07-18 NOTE — Telephone Encounter (Signed)
Pt c/o swelling: STAT is pt has developed SOB within 24 hours  1) How much weight have you gained and in what time span? Went from 190 lbs  to 210 lbs since pandemic and gyms closed  2) If swelling, where is the swelling located? Left leg swells  3) Are you currently taking a fluid pill? yes  4) Are you currently SOB? no  5) Do you have a log of your daily weights (if so, list)? no  6) Have you gained 3 pounds in a day or 5 pounds in a week? Probably 5 pounds in a week  7) Have you traveled recently? Yes, [place] and [place] a few weeks ago.    Patient states he had a stroke back in February and his left side swelled up. He states his left leg has continued to swell at night and then go down. He states it is also painful. He is scheduled 08/22/2019.

## 2019-07-18 NOTE — Telephone Encounter (Signed)
Returned call to patient who states he is having left leg and foot swelling. He was seen in ED on 6/14 for this complaint and was prescribed furosemide 20 mg daily as needed. Reports he is taking furosemide almost daily, does not take it on days that he has to be out of the house a lot. He reports not much improvement after taking. Has appointment with Dr. Harrington Challenger, PCP, on 7/14. States he saw PA at pain management today and was advised to call us. Has an MRI ordered "of everything."  Reports SOB with walking long distances; walked 1 mile today and had to stop and rest a few times. Fatigue and SOB are reasons he has to stop. I gave him instructions on weighing himself daily and he says he will not be able to see the weight on the scale because of his stomach. Advised him to try to find a way to check his weight every morning in same clothing, to elevate legs above level of heart when he is sitting, and to get compression knee highs. Gave him information on loungedoctor device that he says he will ask his daughter to look up.  I advised continued furosemide daily and continue walking and that I will forward message to [name] for awareness and additional advice. He verbalized understanding and agreement and thanked me for the call.

## 2019-07-18 NOTE — Telephone Encounter (Signed)
Returned call to patient to review Dr. Hassell Done advice. He states he is taking furosemide 40 mg daily, not 20 mg and I had him verify with his Rx bottles. Reports good urine output. I advised him to double the furosemide to 80 mg for 3 days (Sat. Sun., Mon.) and scheduled him to come to our office for bmet on Tuesday 7/13. I advised that he take at least 40 mg of furosemide on each day if he does not tolerate 80 mg the first day. He agrees to go to medical supply store for compression stockings and will work on better leg elevation. I advised him to call back prior to lab appointment with questions or concerns. He verbalized understanding and agreement with plan and thanked me for the call.

## 2019-07-22 ENCOUNTER — Other Ambulatory Visit: Payer: Self-pay

## 2019-07-22 ENCOUNTER — Other Ambulatory Visit: Payer: Medicare Other

## 2019-07-22 DIAGNOSIS — I10 Essential (primary) hypertension: Secondary | ICD-10-CM

## 2019-07-22 DIAGNOSIS — M7989 Other specified soft tissue disorders: Secondary | ICD-10-CM | POA: Diagnosis not present

## 2019-07-22 LAB — BASIC METABOLIC PANEL
BUN/Creatinine Ratio: 12 (ref 10–24)
BUN: 18 mg/dL (ref 8–27)
CO2: 25 mmol/L (ref 20–29)
Calcium: 9.2 mg/dL (ref 8.6–10.2)
Chloride: 101 mmol/L (ref 96–106)
Creatinine, Ser: 1.51 mg/dL — ABNORMAL HIGH (ref 0.76–1.27)
GFR calc Af Amer: 51 mL/min/{1.73_m2} — ABNORMAL LOW (ref >59)
GFR calc non Af Amer: 44 mL/min/{1.73_m2} — ABNORMAL LOW (ref >59)
Glucose: 188 mg/dL — ABNORMAL HIGH (ref 65–99)
Potassium: 4.2 mmol/L (ref 3.5–5.2)
Sodium: 137 mmol/L (ref 134–144)

## 2019-07-23 DIAGNOSIS — D649 Anemia, unspecified: Secondary | ICD-10-CM | POA: Diagnosis not present

## 2019-07-23 DIAGNOSIS — K219 Gastro-esophageal reflux disease without esophagitis: Secondary | ICD-10-CM | POA: Diagnosis not present

## 2019-07-23 DIAGNOSIS — Z0000 Encounter for general adult medical examination without abnormal findings: Secondary | ICD-10-CM | POA: Diagnosis not present

## 2019-07-23 DIAGNOSIS — I10 Essential (primary) hypertension: Secondary | ICD-10-CM | POA: Diagnosis not present

## 2019-07-23 DIAGNOSIS — I700 Atherosclerosis of aorta: Secondary | ICD-10-CM | POA: Diagnosis not present

## 2019-07-23 DIAGNOSIS — E782 Mixed hyperlipidemia: Secondary | ICD-10-CM | POA: Diagnosis not present

## 2019-07-23 DIAGNOSIS — I209 Angina pectoris, unspecified: Secondary | ICD-10-CM | POA: Diagnosis not present

## 2019-07-23 DIAGNOSIS — E559 Vitamin D deficiency, unspecified: Secondary | ICD-10-CM | POA: Diagnosis not present

## 2019-07-23 DIAGNOSIS — N183 Chronic kidney disease, stage 3 unspecified: Secondary | ICD-10-CM | POA: Diagnosis not present

## 2019-07-23 DIAGNOSIS — G609 Hereditary and idiopathic neuropathy, unspecified: Secondary | ICD-10-CM | POA: Diagnosis not present

## 2019-07-23 DIAGNOSIS — I69354 Hemiplegia and hemiparesis following cerebral infarction affecting left non-dominant side: Secondary | ICD-10-CM | POA: Diagnosis not present

## 2019-07-23 DIAGNOSIS — B009 Herpesviral infection, unspecified: Secondary | ICD-10-CM | POA: Diagnosis not present

## 2019-07-24 DIAGNOSIS — E1165 Type 2 diabetes mellitus with hyperglycemia: Secondary | ICD-10-CM | POA: Diagnosis not present

## 2019-07-24 DIAGNOSIS — E669 Obesity, unspecified: Secondary | ICD-10-CM | POA: Diagnosis not present

## 2019-07-24 NOTE — Telephone Encounter (Signed)
Patient is calling to follow up regarding swelling. He states he would like to report his daily weight in pounds for Dr. Hassell Done nurse.   07/20/19 - 211.6 lbs 07/21/19 - 209.5 lbs 07/22/19 - 209.4 lbs 07/23/19 - 206 lbs 07/24/19 - 206 lbs

## 2019-07-24 NOTE — Telephone Encounter (Signed)
Called patient back to let him know we received his weight readings. Patient wanted to let the doctor know he has been walking the last two day, 1 mile and 1/2 mile. Will forward to [name] and his nurse.

## 2019-07-25 NOTE — Telephone Encounter (Signed)
Excellent results on weight.  COntinue current plan.  JV

## 2019-07-28 NOTE — Telephone Encounter (Signed)
Called and made patient aware that [name] reviewed his weights and that he has been getting regular exercise. Instructed patient to continue with current treatment plan. He verbalized understanding and thanked me for the call.

## 2019-08-01 ENCOUNTER — Other Ambulatory Visit: Payer: Self-pay | Admitting: *Deleted

## 2019-08-01 NOTE — Patient Outreach (Signed)
[facility] ([facility] Care Management  [facility] Social Work  08/01/2019  Steve Brown 03/26/1940 [id number]  Subjective:  Successful telephone outreach call to patient. HIPAA identifiers obtained. Patient states he is doing fairly well. He explained that he developed worsening left leg swelling and was seen in the ED on 06/23/19. The ED prescribed furosemide 20 mg which did not help and he developed SOB  With exertion. At that time, he call his cardiologist and his furosemide was increased to 40 mg daily as needed. Patient states he has lost about 5 pound of fluid weight and his SOB is better. He has been advised to weigh himself daily, call his cardiologist if he gains weight suddenly or becomes symptomatic again, and has a follow-up appointment with cardiology 08/22/19.  Patient reports that he is now seeing an RD for his diabetes, his first appointment was 06/19/19. His FBS was 181 today and his FBS ranges are 76-185. His postprandial blood sugars range from 85 to >200. He had a decrease in his A1c from 8.0 to 7.8. Patient is going to the [employer] weekly to walk and to take chair yoga classes. He states he is motivated to start to read food labels for carbohydrate and sodium content. Emotionally, patient explains he has good days and bad days. He denies having any notion to hurt himself or feelings of not wanting to continue. Patient states he missed his appointment with [facility]. Nurse gave him the facilities phone number and patient stated that he was going to call and reschedule an appointment. Nurse confirmed that patient had her contact information and requested that he call her for any further questions or concerns as needed. Patient verbalized understanding.   Encounter Medications:  Outpatient Encounter Medications as of 08/01/2019  Medication Sig Note  . acetaminophen (TYLENOL) 500 MG tablet Take 1,000 mg by mouth every 6 (six) hours as needed (pain).    . Ascorbic Acid  (VITAMIN C PO) Take 1 tablet by mouth daily.   . bisacodyl (DULCOLAX) 5 MG EC tablet Take 5 mg by mouth daily as needed for moderate constipation.   . cholecalciferol (VITAMIN D) 1000 UNITS tablet Take 1,000 Units by mouth daily.   . diclofenac sodium (VOLTAREN) 1 % GEL Apply 1 application topically See admin instructions. Apply topically once daily , may use an additional time as needed for pain   . DULoxetine (CYMBALTA) 20 MG capsule Take 20 mg by mouth daily. 08/01/2019: Patient states he takes several times daily.  . Echinacea 450 MG CAPS Take 450 mg by mouth daily.    . fluticasone (FLONASE) 50 MCG/ACT nasal spray Place 1 spray into both nostrils 2 (two) times daily as needed for allergies.    . furosemide (LASIX) 20 MG tablet Take 20 mg by mouth daily as needed (leg and ankle swelling).    . furosemide (LASIX) 40 MG tablet Take 40 mg by mouth daily as needed.   . gabapentin (NEURONTIN) 100 MG capsule Take 1 capsule (100 mg total) by mouth 3 (three) times daily.   . insulin lispro (HUMALOG KWIKPEN) 100 UNIT/ML KiwkPen Inject 6-10 Units into the skin 3 (three) times daily with meals.   . IRON PO Take 1 tablet by mouth daily.   . losartan (COZAAR) 100 MG tablet Take 100 mg by mouth daily.   . meclizine (ANTIVERT) 25 MG tablet Take 12.5-25 mg by mouth 3 (three) times daily as needed for dizziness.   . Multiple Vitamin (MULTIVITAMIN WITH MINERALS) TABS   tablet Take 1 tablet by mouth daily.   . nitroGLYCERIN (NITROSTAT) 0.4 MG SL tablet Place 1 tablet (0.4 mg total) under the tongue every 5 (five) minutes as needed for chest pain.   . pantoprazole (PROTONIX) 40 MG tablet Take 40 mg by mouth daily as needed (acid reflux/heartburn).    . potassium chloride (KLOR-CON) 10 MEQ tablet Take 1 tablet (10 mEq total) by mouth daily.   . pregabalin (LYRICA) 300 MG capsule Take 1 capsule (300 mg total) by mouth 2 (two) times daily.   . rivaroxaban (XARELTO) 20 MG TABS tablet Take 20 mg by mouth daily.    .  rosuvastatin (CRESTOR) 10 MG tablet Take 10 mg by mouth at bedtime.    . tamsulosin (FLOMAX) 0.4 MG CAPS capsule Take 0.4 mg by mouth daily.   . torsemide (DEMADEX) 20 MG tablet Take 1 tablet (20 mg total) by mouth daily.   . TRESIBA FLEXTOUCH 100 UNIT/ML SOPN FlexTouch Pen Inject 20 Units into the skin every morning.   . valACYclovir (VALTREX) 500 MG tablet Take 500 mg by mouth daily.   . zinc gluconate 50 MG tablet Take 50 mg by mouth daily.    No facility-administered encounter medications on file as of 08/01/2019.    Functional Status:  In your present state of health, do you have any difficulty performing the following activities: 03/04/2019 02/10/2019  Hearing? N N  Vision? N N  Difficulty concentrating or making decisions? N N  Walking or climbing stairs? N N  Dressing or bathing? N N  Doing errands, shopping? N N  Preparing Food and eating ? N N  Using the Toilet? N N  In the past six months, have you accidently leaked urine? N N  Do you have problems with loss of bowel control? N N  Managing your Medications? N N  Managing your Finances? N N  Housekeeping or managing your Housekeeping? N N  Some recent data might be hidden    Fall/Depression Screening:  PHQ 2/9 Scores 04/30/2019 04/28/2019 02/10/2019 02/10/2019 03/20/2017 09/25/2016 05/19/2013  PHQ - 2 Score 4 3 1 1 0 0 0  PHQ- 9 Score 13 6 - - - - -   Goals Addressed            This Visit's Progress   . Patient will report reduction in A1c by 0.5-1 points within the next 90 days       CARE PLAN ENTRY (see longtitudinal plan of care for additional care plan information)  Objective:  Lab Results  Component Value Date   HGBA1C 9.2 (H) 04/26/2016 .   Lab Results  Component Value Date   CREATININE 1.51 (H) 07/22/2019   CREATININE 1.31 (H) 03/20/2019   CREATININE 1.19 01/12/2019 .   . No results found for: EGFR  Current Barriers:  . Knowledge Deficits related to basic Diabetes pathophysiology and self  care/management  Case Manager Clinical Goal(s):  Over the next 90 days, patient will demonstrate improved adherence to prescribed treatment plan for diabetes self care/management as evidenced by:  . Verbalize daily monitoring and recording of CBG within 90 days . Verbalize adherence to ADA/ carb modified diet within the next 90 days . Verbalize exercise 2-3 days/week  Interventions:  . Reviewed medications with patient and discussed importance of medication adherence . Encouraged patient to read food labels to monitor carbohydrate intake, to continue to go to RD, to go to chair yoga class and the [employer] 3 times weekly, praised patient for   reduction of A1c to 7.8  Patient Self Care Activities:  . Self administers oral medications as prescribed . Self administers insulin as prescribed . Checks blood sugars as prescribed and utilize hyper and hypoglycemia protocol as needed . Patient states he goes to the [facility] and walks around the track and has begun taking chair yoga twice weekly         Plan: [place] will send patient education regarding fluid overload and Glucerna coupons, will call patient within the month of September, and patient agrees to future outreach calls.   Emelia Loron RN, BSN [place] [phone] [email or web address]

## 2019-08-04 ENCOUNTER — Other Ambulatory Visit: Payer: Self-pay | Admitting: Physician Assistant

## 2019-08-04 DIAGNOSIS — R531 Weakness: Secondary | ICD-10-CM

## 2019-08-04 DIAGNOSIS — M549 Dorsalgia, unspecified: Secondary | ICD-10-CM

## 2019-08-05 ENCOUNTER — Telehealth: Payer: Self-pay | Admitting: Interventional Cardiology

## 2019-08-05 DIAGNOSIS — R0600 Dyspnea, unspecified: Secondary | ICD-10-CM

## 2019-08-05 DIAGNOSIS — M7989 Other specified soft tissue disorders: Secondary | ICD-10-CM

## 2019-08-05 DIAGNOSIS — R0609 Other forms of dyspnea: Secondary | ICD-10-CM

## 2019-08-05 NOTE — Telephone Encounter (Signed)
Late entry due to Epic downtime:  Called and spoke to patient. Patient reports having LE edema of his left foot. Patient is anticoagulated. Last weight 211 lbs yesterday. Patient had been taking lasix 80 mg QD x 3 days then decreased to 40 mg QD, and has decreased to 20 mg QD for the past several days. Patient reports not taking his potassium 10 mEq QD for at least a few weeks. Last Cr 1.51 and K 4.2 on 7/13. Denies increased salt intake. Discussed with [name] and patient is to stop lasix, remain off of potassium until after repeat BMET (scheduled for tomorrow). Patient also needs an echo (scheduled for 7/30). Patient verbalized understanding to all instruction and thanked me for the call.

## 2019-08-05 NOTE — Telephone Encounter (Signed)
Pt c/o medication issue:  1. Name of Medication: furosemide (LASIX) 40 MG tablet  2. How are you currently taking this medication (dosage and times per day)? Two 40 MG tablets daily   3. Are you having a reaction (difficulty breathing--STAT)? Yes   4. What is your medication issue? Steve Brown is calling stating he is having a reaction to taking 80 MG's of Lasix daily. He states he has been experiencing excessive fatigue, lightheadedness, and dizziness. Steve Brown spoke with his "Coach" due to this and she advised him she did not see why he would need to stay on that high of a dosage. He has not yet taken any Lasix today, please advise.

## 2019-08-06 ENCOUNTER — Other Ambulatory Visit: Payer: Medicare Other | Admitting: *Deleted

## 2019-08-06 ENCOUNTER — Other Ambulatory Visit: Payer: Self-pay

## 2019-08-06 DIAGNOSIS — R0600 Dyspnea, unspecified: Secondary | ICD-10-CM

## 2019-08-06 DIAGNOSIS — R0609 Other forms of dyspnea: Secondary | ICD-10-CM

## 2019-08-06 DIAGNOSIS — M7989 Other specified soft tissue disorders: Secondary | ICD-10-CM

## 2019-08-07 DIAGNOSIS — R531 Weakness: Secondary | ICD-10-CM | POA: Diagnosis not present

## 2019-08-07 LAB — BASIC METABOLIC PANEL
BUN/Creatinine Ratio: 14 (ref 10–24)
BUN: 20 mg/dL (ref 8–27)
CO2: 27 mmol/L (ref 20–29)
Calcium: 9.4 mg/dL (ref 8.6–10.2)
Chloride: 104 mmol/L (ref 96–106)
Creatinine, Ser: 1.46 mg/dL — ABNORMAL HIGH (ref 0.76–1.27)
GFR calc Af Amer: 53 mL/min/{1.73_m2} — ABNORMAL LOW (ref >59)
GFR calc non Af Amer: 46 mL/min/{1.73_m2} — ABNORMAL LOW (ref >59)
Glucose: 127 mg/dL — ABNORMAL HIGH (ref 65–99)
Potassium: 4.1 mmol/L (ref 3.5–5.2)
Sodium: 140 mmol/L (ref 134–144)

## 2019-08-07 LAB — PRO B NATRIURETIC PEPTIDE: NT-Pro BNP: 93 pg/mL (ref 0–486)

## 2019-08-08 ENCOUNTER — Ambulatory Visit (HOSPITAL_COMMUNITY): Payer: Medicare Other | Attending: Cardiovascular Disease

## 2019-08-08 ENCOUNTER — Telehealth: Payer: Self-pay | Admitting: Interventional Cardiology

## 2019-08-08 ENCOUNTER — Other Ambulatory Visit: Payer: Self-pay

## 2019-08-08 DIAGNOSIS — I890 Lymphedema, not elsewhere classified: Secondary | ICD-10-CM | POA: Diagnosis not present

## 2019-08-08 DIAGNOSIS — M12272 Villonodular synovitis (pigmented), left ankle and foot: Secondary | ICD-10-CM | POA: Diagnosis not present

## 2019-08-08 DIAGNOSIS — R0609 Other forms of dyspnea: Secondary | ICD-10-CM

## 2019-08-08 DIAGNOSIS — R0600 Dyspnea, unspecified: Secondary | ICD-10-CM

## 2019-08-08 DIAGNOSIS — M12271 Villonodular synovitis (pigmented), right ankle and foot: Secondary | ICD-10-CM | POA: Diagnosis not present

## 2019-08-08 DIAGNOSIS — M7989 Other specified soft tissue disorders: Secondary | ICD-10-CM | POA: Diagnosis not present

## 2019-08-08 LAB — ECHOCARDIOGRAM COMPLETE
Area-P 1/2: 4.15 cm2
P 1/2 time: 508 msec
S' Lateral: 2.8 cm

## 2019-08-08 MED ORDER — FUROSEMIDE 20 MG PO TABS
ORAL_TABLET | ORAL | 3 refills | Status: DC
Start: 2019-08-08 — End: 2020-06-01

## 2019-08-08 MED ORDER — POTASSIUM CHLORIDE ER 10 MEQ PO TBCR
EXTENDED_RELEASE_TABLET | ORAL | 3 refills | Status: DC
Start: 2019-08-08 — End: 2019-08-17

## 2019-08-08 NOTE — Telephone Encounter (Signed)
Patient is returning Steve Brown's call regarding lab results, Sharyn Lull unavailable. Please call back.

## 2019-08-08 NOTE — Telephone Encounter (Signed)
Steve Booze, MD  Drue Novel I, RN Cc: P Cv Div Ch St Triage  "BMet stable. No evidence of fluid overload.   OK to use Furosemide 20- 40 mg PO daily prn leg swelling, and can take KCl 10 mEq only when he takes the furosemide."   The patient has been notified of the result and verbalized understanding.  All questions (if any) were answered. Antonieta Iba, RN 08/08/2019 1:16 PM

## 2019-08-11 ENCOUNTER — Inpatient Hospital Stay (HOSPITAL_COMMUNITY)
Admission: EM | Admit: 2019-08-11 | Discharge: 2019-08-17 | DRG: 291 | Disposition: A | Discharge status: Home Health | Payer: Medicare Other | Attending: Internal Medicine | Admitting: Internal Medicine

## 2019-08-11 ENCOUNTER — Other Ambulatory Visit: Payer: Self-pay

## 2019-08-11 ENCOUNTER — Emergency Department (HOSPITAL_COMMUNITY): Payer: Medicare Other

## 2019-08-11 ENCOUNTER — Telehealth: Payer: Self-pay | Admitting: Interventional Cardiology

## 2019-08-11 ENCOUNTER — Encounter (HOSPITAL_COMMUNITY): Payer: Self-pay | Admitting: Emergency Medicine

## 2019-08-11 DIAGNOSIS — I25110 Atherosclerotic heart disease of native coronary artery with unstable angina pectoris: Secondary | ICD-10-CM | POA: Diagnosis present

## 2019-08-11 DIAGNOSIS — R7989 Other specified abnormal findings of blood chemistry: Secondary | ICD-10-CM | POA: Diagnosis not present

## 2019-08-11 DIAGNOSIS — I10 Essential (primary) hypertension: Secondary | ICD-10-CM | POA: Diagnosis present

## 2019-08-11 DIAGNOSIS — F329 Major depressive disorder, single episode, unspecified: Secondary | ICD-10-CM | POA: Diagnosis present

## 2019-08-11 DIAGNOSIS — E1122 Type 2 diabetes mellitus with diabetic chronic kidney disease: Secondary | ICD-10-CM | POA: Diagnosis present

## 2019-08-11 DIAGNOSIS — M5126 Other intervertebral disc displacement, lumbar region: Secondary | ICD-10-CM | POA: Diagnosis not present

## 2019-08-11 DIAGNOSIS — M5386 Other specified dorsopathies, lumbar region: Secondary | ICD-10-CM | POA: Diagnosis present

## 2019-08-11 DIAGNOSIS — R079 Chest pain, unspecified: Secondary | ICD-10-CM | POA: Diagnosis present

## 2019-08-11 DIAGNOSIS — I2510 Atherosclerotic heart disease of native coronary artery without angina pectoris: Secondary | ICD-10-CM | POA: Diagnosis not present

## 2019-08-11 DIAGNOSIS — N400 Enlarged prostate without lower urinary tract symptoms: Secondary | ICD-10-CM | POA: Diagnosis present

## 2019-08-11 DIAGNOSIS — N182 Chronic kidney disease, stage 2 (mild): Secondary | ICD-10-CM | POA: Diagnosis present

## 2019-08-11 DIAGNOSIS — I130 Hypertensive heart and chronic kidney disease with heart failure and stage 1 through stage 4 chronic kidney disease, or unspecified chronic kidney disease: Secondary | ICD-10-CM | POA: Diagnosis present

## 2019-08-11 DIAGNOSIS — R0602 Shortness of breath: Secondary | ICD-10-CM

## 2019-08-11 DIAGNOSIS — M5030 Other cervical disc degeneration, unspecified cervical region: Secondary | ICD-10-CM | POA: Diagnosis present

## 2019-08-11 DIAGNOSIS — I69354 Hemiplegia and hemiparesis following cerebral infarction affecting left non-dominant side: Secondary | ICD-10-CM

## 2019-08-11 DIAGNOSIS — R140 Abdominal distension (gaseous): Secondary | ICD-10-CM

## 2019-08-11 DIAGNOSIS — G629 Polyneuropathy, unspecified: Secondary | ICD-10-CM | POA: Diagnosis present

## 2019-08-11 DIAGNOSIS — I209 Angina pectoris, unspecified: Secondary | ICD-10-CM | POA: Diagnosis not present

## 2019-08-11 DIAGNOSIS — Z9989 Dependence on other enabling machines and devices: Secondary | ICD-10-CM

## 2019-08-11 DIAGNOSIS — R609 Edema, unspecified: Secondary | ICD-10-CM | POA: Diagnosis not present

## 2019-08-11 DIAGNOSIS — I700 Atherosclerosis of aorta: Secondary | ICD-10-CM | POA: Diagnosis present

## 2019-08-11 DIAGNOSIS — E118 Type 2 diabetes mellitus with unspecified complications: Secondary | ICD-10-CM | POA: Diagnosis present

## 2019-08-11 DIAGNOSIS — G8929 Other chronic pain: Secondary | ICD-10-CM | POA: Diagnosis present

## 2019-08-11 DIAGNOSIS — G4733 Obstructive sleep apnea (adult) (pediatric): Secondary | ICD-10-CM | POA: Diagnosis not present

## 2019-08-11 DIAGNOSIS — Z823 Family history of stroke: Secondary | ICD-10-CM

## 2019-08-11 DIAGNOSIS — M5136 Other intervertebral disc degeneration, lumbar region: Secondary | ICD-10-CM | POA: Diagnosis not present

## 2019-08-11 DIAGNOSIS — Z8249 Family history of ischemic heart disease and other diseases of the circulatory system: Secondary | ICD-10-CM

## 2019-08-11 DIAGNOSIS — Z7901 Long term (current) use of anticoagulants: Secondary | ICD-10-CM

## 2019-08-11 DIAGNOSIS — N1832 Chronic kidney disease, stage 3b: Secondary | ICD-10-CM | POA: Diagnosis present

## 2019-08-11 DIAGNOSIS — Z86711 Personal history of pulmonary embolism: Secondary | ICD-10-CM | POA: Diagnosis present

## 2019-08-11 DIAGNOSIS — M48061 Spinal stenosis, lumbar region without neurogenic claudication: Secondary | ICD-10-CM | POA: Diagnosis present

## 2019-08-11 DIAGNOSIS — Z7401 Bed confinement status: Secondary | ICD-10-CM | POA: Diagnosis not present

## 2019-08-11 DIAGNOSIS — M47819 Spondylosis without myelopathy or radiculopathy, site unspecified: Secondary | ICD-10-CM | POA: Diagnosis present

## 2019-08-11 DIAGNOSIS — E11649 Type 2 diabetes mellitus with hypoglycemia without coma: Secondary | ICD-10-CM | POA: Diagnosis present

## 2019-08-11 DIAGNOSIS — I5033 Acute on chronic diastolic (congestive) heart failure: Secondary | ICD-10-CM | POA: Diagnosis present

## 2019-08-11 DIAGNOSIS — R0600 Dyspnea, unspecified: Secondary | ICD-10-CM | POA: Diagnosis not present

## 2019-08-11 DIAGNOSIS — Z9841 Cataract extraction status, right eye: Secondary | ICD-10-CM

## 2019-08-11 DIAGNOSIS — E785 Hyperlipidemia, unspecified: Secondary | ICD-10-CM | POA: Diagnosis present

## 2019-08-11 DIAGNOSIS — Z9049 Acquired absence of other specified parts of digestive tract: Secondary | ICD-10-CM

## 2019-08-11 DIAGNOSIS — M25552 Pain in left hip: Secondary | ICD-10-CM | POA: Diagnosis not present

## 2019-08-11 DIAGNOSIS — Z9842 Cataract extraction status, left eye: Secondary | ICD-10-CM

## 2019-08-11 DIAGNOSIS — E7800 Pure hypercholesterolemia, unspecified: Secondary | ICD-10-CM | POA: Diagnosis present

## 2019-08-11 DIAGNOSIS — Z794 Long term (current) use of insulin: Secondary | ICD-10-CM

## 2019-08-11 DIAGNOSIS — I499 Cardiac arrhythmia, unspecified: Secondary | ICD-10-CM | POA: Diagnosis not present

## 2019-08-11 DIAGNOSIS — Z20822 Contact with and (suspected) exposure to covid-19: Secondary | ICD-10-CM | POA: Diagnosis not present

## 2019-08-11 DIAGNOSIS — Z923 Personal history of irradiation: Secondary | ICD-10-CM

## 2019-08-11 DIAGNOSIS — M2550 Pain in unspecified joint: Secondary | ICD-10-CM | POA: Diagnosis not present

## 2019-08-11 DIAGNOSIS — K219 Gastro-esophageal reflux disease without esophagitis: Secondary | ICD-10-CM | POA: Diagnosis present

## 2019-08-11 DIAGNOSIS — Z888 Allergy status to other drugs, medicaments and biological substances status: Secondary | ICD-10-CM

## 2019-08-11 DIAGNOSIS — Z8546 Personal history of malignant neoplasm of prostate: Secondary | ICD-10-CM

## 2019-08-11 DIAGNOSIS — G4730 Sleep apnea, unspecified: Secondary | ICD-10-CM | POA: Diagnosis present

## 2019-08-11 DIAGNOSIS — Z955 Presence of coronary angioplasty implant and graft: Secondary | ICD-10-CM

## 2019-08-11 DIAGNOSIS — Z8673 Personal history of transient ischemic attack (TIA), and cerebral infarction without residual deficits: Secondary | ICD-10-CM | POA: Diagnosis not present

## 2019-08-11 DIAGNOSIS — R0789 Other chest pain: Secondary | ICD-10-CM | POA: Diagnosis not present

## 2019-08-11 DIAGNOSIS — K567 Ileus, unspecified: Secondary | ICD-10-CM | POA: Diagnosis present

## 2019-08-11 DIAGNOSIS — I959 Hypotension, unspecified: Secondary | ICD-10-CM | POA: Diagnosis not present

## 2019-08-11 DIAGNOSIS — Z79899 Other long term (current) drug therapy: Secondary | ICD-10-CM

## 2019-08-11 DIAGNOSIS — Z961 Presence of intraocular lens: Secondary | ICD-10-CM | POA: Diagnosis present

## 2019-08-11 DIAGNOSIS — I69351 Hemiplegia and hemiparesis following cerebral infarction affecting right dominant side: Secondary | ICD-10-CM | POA: Diagnosis not present

## 2019-08-11 DIAGNOSIS — M5432 Sciatica, left side: Secondary | ICD-10-CM | POA: Diagnosis present

## 2019-08-11 DIAGNOSIS — I429 Cardiomyopathy, unspecified: Secondary | ICD-10-CM | POA: Diagnosis not present

## 2019-08-11 DIAGNOSIS — R42 Dizziness and giddiness: Secondary | ICD-10-CM | POA: Diagnosis not present

## 2019-08-11 DIAGNOSIS — Z8679 Personal history of other diseases of the circulatory system: Secondary | ICD-10-CM | POA: Diagnosis not present

## 2019-08-11 DIAGNOSIS — I200 Unstable angina: Secondary | ICD-10-CM | POA: Diagnosis not present

## 2019-08-11 LAB — COMPREHENSIVE METABOLIC PANEL
ALT: 19 U/L (ref 0–44)
AST: 21 U/L (ref 15–41)
Albumin: 3.6 g/dL (ref 3.5–5.0)
Alkaline Phosphatase: 65 U/L (ref 38–126)
Anion gap: 9 (ref 5–15)
BUN: 18 mg/dL (ref 8–23)
CO2: 29 mmol/L (ref 22–32)
Calcium: 9.5 mg/dL (ref 8.9–10.3)
Chloride: 103 mmol/L (ref 98–111)
Creatinine, Ser: 1.48 mg/dL — ABNORMAL HIGH (ref 0.61–1.24)
GFR calc Af Amer: 52 mL/min — ABNORMAL LOW (ref >60)
GFR calc non Af Amer: 45 mL/min — ABNORMAL LOW (ref >60)
Glucose, Bld: 61 mg/dL — ABNORMAL LOW (ref 70–99)
Potassium: 3.9 mmol/L (ref 3.5–5.1)
Sodium: 141 mmol/L (ref 135–145)
Total Bilirubin: 0.9 mg/dL (ref 0.3–1.2)
Total Protein: 6.7 g/dL (ref 6.5–8.1)

## 2019-08-11 LAB — CBC WITH DIFFERENTIAL/PLATELET
Abs Immature Granulocytes: 0.01 10*3/uL (ref 0.00–0.07)
Basophils Absolute: 0 10*3/uL (ref 0.0–0.1)
Basophils Relative: 0 %
Eosinophils Absolute: 0 10*3/uL (ref 0.0–0.5)
Eosinophils Relative: 0 %
HCT: 39.6 % (ref 39.0–52.0)
Hemoglobin: 13.3 g/dL (ref 13.0–17.0)
Immature Granulocytes: 0 %
Lymphocytes Relative: 36 %
Lymphs Abs: 1.9 10*3/uL (ref 0.7–4.0)
MCH: 31.7 pg (ref 26.0–34.0)
MCHC: 33.6 g/dL (ref 30.0–36.0)
MCV: 94.3 fL (ref 80.0–100.0)
Monocytes Absolute: 0.7 10*3/uL (ref 0.1–1.0)
Monocytes Relative: 13 %
Neutro Abs: 2.7 10*3/uL (ref 1.7–7.7)
Neutrophils Relative %: 51 %
Platelets: 199 10*3/uL (ref 150–400)
RBC: 4.2 MIL/uL — ABNORMAL LOW (ref 4.22–5.81)
RDW: 12.6 % (ref 11.5–15.5)
WBC: 5.4 10*3/uL (ref 4.0–10.5)
nRBC: 0 % (ref 0.0–0.2)

## 2019-08-11 LAB — CBG MONITORING, ED
Glucose-Capillary: 48 mg/dL — ABNORMAL LOW (ref 70–99)
Glucose-Capillary: 235 mg/dL — ABNORMAL HIGH (ref 70–99)

## 2019-08-11 LAB — BRAIN NATRIURETIC PEPTIDE: B Natriuretic Peptide: 70.3 pg/mL (ref 0.0–100.0)

## 2019-08-11 LAB — D-DIMER, QUANTITATIVE: D-Dimer, Quant: 1.24 ug/mL-FEU — ABNORMAL HIGH (ref 0.00–0.50)

## 2019-08-11 LAB — TROPONIN I (HIGH SENSITIVITY)
Troponin I (High Sensitivity): 12 ng/L (ref <18)
Troponin I (High Sensitivity): 12 ng/L (ref <18)

## 2019-08-11 LAB — SARS CORONAVIRUS 2 BY RT PCR (HOSPITAL ORDER, PERFORMED IN ~~LOC~~ HOSPITAL LAB): SARS Coronavirus 2: NEGATIVE

## 2019-08-11 MED ORDER — IOHEXOL 350 MG/ML SOLN
75.0000 mL | Freq: Once | INTRAVENOUS | Status: AC | PRN
  Administered 2019-08-11: 75 mL via INTRAVENOUS

## 2019-08-11 MED ORDER — FUROSEMIDE 10 MG/ML IJ SOLN
60.0000 mg | Freq: Once | INTRAMUSCULAR | Status: AC
  Administered 2019-08-11: 60 mg via INTRAVENOUS
  Filled 2019-08-11: qty 6

## 2019-08-11 MED ORDER — DEXTROSE 50 % IV SOLN
1.0000 | Freq: Once | INTRAVENOUS | Status: AC
  Administered 2019-08-11: 50 mL via INTRAVENOUS
  Filled 2019-08-11: qty 50

## 2019-08-11 MED ORDER — ENOXAPARIN SODIUM 100 MG/ML ~~LOC~~ SOLN
1.0000 mg/kg | Freq: Two times a day (BID) | SUBCUTANEOUS | Status: DC
  Administered 2019-08-12 – 2019-08-14 (×4): 95 mg via SUBCUTANEOUS
  Filled 2019-08-11 (×5): qty 0.95

## 2019-08-11 MED ORDER — NITROGLYCERIN 0.4 MG SL SUBL: 0.4000 mg | SUBLINGUAL_TABLET | SUBLINGUAL | Status: DC | PRN

## 2019-08-11 MED ORDER — SODIUM CHLORIDE 0.9 % IV BOLUS
500.0000 mL | Freq: Once | INTRAVENOUS | Status: AC
  Administered 2019-08-11: 500 mL via INTRAVENOUS

## 2019-08-11 NOTE — ED Provider Notes (Signed)
[facility] EMERGENCY DEPARTMENT Provider Note   CSN: [id number] Arrival date & time: 08/11/19  1625     History Chief Complaint  Patient presents with  . Chest Pain    Steve Brown is a 79 y.o. male possible history of CAD, LAD stent, CKD, PE (currently on Xarelto), hyperlipidemia, hypertension who brought in by EMS for evaluation of left-sided chest pain, shortness of breath, fatigue.  He states that this has been ongoing for about a week.  He states that has been intermittently occurring at first but states over the last few days, it has become more constant.  He describes the chest pain as a dull ache and states that it radiates into his left upper arm.  He denies any associated nausea/vomiting or diaphoresis.  He states that it occurs randomly and he does not think that the pain is worsened with exertion.  He does state that he has had fatigue and shortness of breath which is worse with exertion.  He states that yesterday, he walked to the mailbox.  He does endorse that he was very short of breath and fatigue by the time he got to the mailbox.  Today, he was attempting to walk to the mailbox and could not make it all the way because he got so short of breath and tired.  He had to call EMS.  He was having some chest pain with EMS and was given nitro which he states brought his chest pain down to 7/10-6/10.  He states that the pain is not worse with deep inspiration.  He states he felt lightheaded today as well.  No syncopal episode.  He also states that he has had leg swelling over the last 2 months.  Particularly in the left.  He states that he will notice that it is worse throughout the day and then when he goes to sleep and wakes up in the morning, it is better but then the leg swelling returns.  He had a small bump in his Lasix recently and now is on 20 mg a day which he states he has been compliant with.  He also reports he has been compliant with his Xarelto.  He does not  smoke.  He has a history of hypertension, hyperlipidemia, diabetes.  He does not think that there is any family history of MI before the age of 10.  He thinks that his PEs were caused by travel to [place]. He did get COVID vaccine.    The history is provided by the patient.    HPI: A 79 year old patient with a history of treated diabetes, hypertension, hypercholesterolemia and obesity presents for evaluation of chest pain. Initial onset of pain was more than 6 hours ago. The patient's chest pain is described as heaviness/pressure/tightness, is not worse with exertion and is relieved by nitroglycerin. The patient's chest pain is not middle- or left-sided, is not well-localized, is not sharp and does radiate to the arms/jaw/neck. The patient does not complain of nausea and denies diaphoresis. The patient has no history of stroke, has no history of peripheral artery disease, has not smoked in the past 90 days and has no relevant family history of coronary artery disease (first degree relative at less than age 61).   Past Medical History:  Diagnosis Date  . Abnormal prostate biopsy   . Anticoagulant long-term use    currently xarelto  . BPH with elevated PSA   . CKD (chronic kidney disease), stage II   .  Complication of anesthesia    limted neck rom limited use of left arm due to cva  . Coronary artery disease    CARDIOLOGIST-  DR Irish Lack--  2010-- PCI w/ stenting midLAD  . DDD (degenerative disc disease), lumbar   . Degeneration of cervical intervertebral disc   . Depression   . Dyspnea on exertion   . GERD (gastroesophageal reflux disease)   . Hemiparesis due to cerebral infarction   . History of cerebrovascular accident (CVA) with residual deficit 2002 and 2003--  hemiparisis both sides   per MRI  anterior left frontal lobe, left para midline pons, and inferior cerebullam bilaterally infarcts  . History of pulmonary embolus (PE)    06-30-2012  extensive bilaterally  . History of  recurrent TIAs   . History of syncope    hx multiple pre-syncope and syncopal episodes due to vasovagal, orthostatic hypotension, dehydration  . History of TIAs    several since 2002  . Hyperlipidemia   . Hypertension   . Mild atherosclerosis of carotid artery, bilateral    per last duplex 11-04-2014  bilateral ICA 1--39%  . Neuropathy    fingers  . OSA on CPAP    followed by dr dohmeier--  sev. osa w/ AHI 65.9  . Prostate cancer ([place]) dx 2018  . Renal insufficiency   . S/P coronary artery stent placement 2010   stenting to mid LAD  . Simple renal cyst    bilaterally  . Stroke ([place])   . Trigger finger of both hands 11-17-13  . Type 2 diabetes mellitus ([place]) dx 1986   last one A1c 9.2 on 04-26-2016  . Unsteady gait    . Hx prior CVA/TIAs;  . Vertebral artery occlusion, left    chronic    Patient Active Problem List   Diagnosis Date Noted  . 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 ([place]) 10/21/2018  . Nocturnal hypoxemia 10/21/2018  . Radiation therapy complication 49/70/2637  . Primary prostate cancer ([place]) 07/31/2017  . Anemia 07/31/2017  . Vertigo due to cerebrovascular disease 07/31/2017  . Poor compliance with CPAP treatment 07/31/2017  . Absolute anemia 05/16/2017  . Avitaminosis D 05/16/2017  . Benign essential HTN 05/16/2017  . Benign prostatic hypertrophy without urinary obstruction 05/16/2017  . Clinical depression 05/16/2017  . CN (constipation) 05/16/2017  . Current drug use 05/16/2017  . Diabetic neuropathy ([place]) 05/16/2017  . Genital herpes 05/16/2017  . Infarction of lung due to iatrogenic pulmonary embolism ([place]) 05/16/2017  . Peripheral neuralgia 05/16/2017  . Arteriosclerosis of coronary artery 05/16/2017  . Artery disease, cerebral 05/16/2017  . Apnea, sleep 05/16/2017  . Arthralgia of hip or thigh 05/16/2017  . Malignant neoplasm of prostate ([place]) 09/28/2016  . Vascular dementia in remission ([place]) 09/28/2016  . Remote history of  stroke 09/28/2016  . Acute kidney injury ([place]) 05/18/2016  . Dehydration 05/18/2016  . Near syncope 05/18/2016  . Orthostatic hypotension 05/18/2016  . CKD (chronic kidney disease), stage II 04/26/2016  . UTI (urinary tract infection) 04/26/2016  . Encounter for counseling on use of CPAP 11/18/2015  . Chronic cholecystitis with calculus 11/02/2015  . Stroke, 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[place]) 04/22/2015  . TIA (transient ischemic attack) 11/03/2014  . OSA on CPAP 05/18/2014  . White matter disease 08/14/2013  . OSA (obstructive sleep apnea) 08/14/2013  . Abnormal x-ray of temporomandibular joint 05/30/2013  . Chronic infection of sinus 05/30/2013  . Cough 05/30/2013  . Fatigue 05/30/2013  . Unsteady gait 05/19/2013  . Combined fat and  carbohydrate induced hyperlipemia 04/24/2013  . Syncope 03/20/2013  . Angina pectoris ([place]) 10/08/2012  . Hypertension   . Stroke ([place])   . History of TIAs   . Lumbago   . Other and unspecified hyperlipidemia   . Personal history of unspecified circulatory disease   . Unspecified fall   . Pain in joint, multiple sites   . Degeneration of cervical intervertebral disc   . Unspecified cardiovascular disease   . History of pulmonary embolism: June 2014,  Takes Xarelto 07/01/2012    Class: History of  . Hemiparesis ([place]) 07/18/2011  . Diabetes mellitus type 2 with complications ([place]) 17/61/6073  . HTN (hypertension) 07/18/2011  . CAD in native artery 07/18/2011  . History of recurrent TIAs 07/18/2011    Past Surgical History:  Procedure Laterality Date  . ANTERIOR CERVICAL DECOMP/DISCECTOMY FUSION  2004   C3 -- C6 limited rom  . CARDIAC CATHETERIZATION  06-10-2010   dr Irish Lack   wide patent LAD stent, mid lesion at the origin of the septal prior to the previous stent 40-50%/  normal LVF, ef 55%  . CARDIOVASCULAR STRESS TEST  10-23-2012  dr Irish Lack   normal nuclear perfusion study w/ no ischemia/  normal LV function and wall motion , ef 65%  . CARPAL  TUNNEL RELEASE Bilateral   . CATARACT EXTRACTION W/ INTRAOCULAR LENS  IMPLANT, BILATERAL    . CHOLECYSTECTOMY N/A 11/02/2015   Procedure: LAPAROSCOPIC CHOLECYSTECTOMY WITH INTRAOPERATIVE CHOLANGIOGRAM;  Surgeon: Donnie Mesa, MD;  Location: [facility];  Service: General;  Laterality: N/A;  . COLONOSCOPY    . CORONARY ANGIOPLASTY WITH STENT PLACEMENT  02/2008   stenting to mid LAD  . GOLD SEED IMPLANT N/A 11/15/2016   Procedure: GOLD SEED IMPLANT TIMES THREE;  Surgeon: Ardis Hughs, MD;  Location: [facility];  Service: Urology;  Laterality: N/A;  . IR ANGIO INTRA EXTRACRAN SEL COM CAROTID INNOMINATE BILAT MOD SED  06/13/2018  . IR ANGIO VERTEBRAL SEL VERTEBRAL UNI R MOD SED  06/13/2018  . IR US GUIDE VASC ACCESS RIGHT  06/13/2018  . LEFT HEART CATH AND CORONARY ANGIOGRAPHY N/A 05/25/2017   Procedure: LEFT HEART CATH AND CORONARY ANGIOGRAPHY;  Surgeon: Jettie Booze, MD;  Location: [facility];  Service: Cardiovascular;  Laterality: N/A;  . LEFT HEART CATHETERIZATION WITH CORONARY ANGIOGRAM N/A 04/03/2013   Procedure: LEFT HEART CATHETERIZATION WITH CORONARY ANGIOGRAM;  Surgeon: Jettie Booze, MD;  Location: [facility] CATH LAB;  Service: Cardiovascular;  Laterality: N/A;  patent mLAD stent  w/ mild disease in remainder LAD and its branches;  mod. focal lesion midLCFx- FFR of lesion was negative for ischemia/  normal LVSF, ef 50%  . lungs  2005   "fluid pumped off lungs"  . NEUROPLASTY / TRANSPOSITION ULNAR NERVE AT ELBOW Right 2004  . PROSTATE BIOPSY N/A 08/31/2016   Procedure: PROSTATE  BIOPSY TRANSRECTAL ULTRASONIC PROSTATE (TUBP);  Surgeon: Ardis Hughs, MD;  Location: [facility];  Service: Urology;  Laterality: N/A;  . SPACE OAR INSTILLATION N/A 11/15/2016   Procedure: SPACE OAR INSTILLATION;  Surgeon: Ardis Hughs, MD;  Location: [facility];  Service: Urology;  Laterality: N/A;  . TRANSTHORACIC ECHOCARDIOGRAM  04/27/2016    severe focal basal LVH, ef 60-65%,  grade 2 diastoilc dysfunction/  mild AR, MR, and TR/  atrial septum lipomatous hypertrophy/  PASP 63mmHg  . UMBILICAL HERNIA REPAIR         Family History  Problem Relation Age  of Onset  . Aneurysm Mother   . Cancer Father        unknown either pancreatic or prostate  . Stroke Brother   . Dementia Sister   . Heart attack Neg Hx     Social History   Tobacco Use  . Smoking status: Never Smoker  . Smokeless tobacco: Never Used  Vaping Use  . Vaping Use: Never used  Substance Use Topics  . Alcohol use: No    Alcohol/week: 0.0 standard drinks  . Drug use: No    Home Medications Prior to Admission medications   Medication Sig Start Date End Date Taking? Authorizing Provider  acetaminophen (TYLENOL) 500 MG tablet Take 1,000 mg by mouth every 6 (six) hours as needed (pain).    Yes [provider]  Ascorbic Acid (VITAMIN C PO) Take 1 tablet by mouth daily.   Yes [provider]  cholecalciferol (VITAMIN D) 1000 UNITS tablet Take 1,000 Units by mouth daily.   Yes [provider]  diclofenac sodium (VOLTAREN) 1 % GEL Apply 1 application topically See admin instructions. Apply topically once daily , may use an additional time as needed for pain 10/16/16  Yes [provider]  Echinacea 450 MG CAPS Take 450 mg by mouth daily.    Yes [provider]  fluticasone (FLONASE) 50 MCG/ACT nasal spray Place 1 spray into both nostrils 2 (two) times daily as needed for allergies.  09/26/16  Yes [provider]  furosemide (LASIX) 20 MG tablet Take 1-2 tablets daily as needed for swelling. Patient taking differently: Take 20 mg by mouth daily.  08/08/19  Yes Jettie Booze, MD  insulin lispro (HUMALOG KWIKPEN) 100 UNIT/ML KiwkPen Inject 6-10 Units into the skin 3 (three) times daily with meals.   Yes [provider]  losartan (COZAAR) 100 MG tablet Take 100 mg by mouth daily. 03/29/18  Yes [provider]  Multiple Vitamin (MULTIVITAMIN WITH MINERALS) TABS tablet Take 1 tablet by mouth daily.   Yes [provider]  nitroGLYCERIN (NITROSTAT) 0.4 MG SL tablet Place 1 tablet (0.4 mg total) under the tongue every 5 (five) minutes as needed for chest pain. 12/07/15  Yes Jettie Booze, MD  pregabalin (LYRICA) 300 MG capsule Take 1 capsule (300 mg total) by mouth 2 (two) times daily. 07/26/12  Yes Dohmeier, Asencion Partridge, MD  rivaroxaban (XARELTO) 20 MG TABS tablet Take 20 mg by mouth daily.    Yes [provider]  TRESIBA FLEXTOUCH 100 UNIT/ML SOPN FlexTouch Pen Inject 26 Units into the skin every morning.  05/11/18  Yes [provider]  valACYclovir (VALTREX) 500 MG tablet Take 500 mg by mouth daily.   Yes [provider]  zinc gluconate 50 MG tablet Take 50 mg by mouth daily.   Yes [provider]  gabapentin (NEURONTIN) 100 MG capsule Take 1 capsule (100 mg total) by mouth 3 (three) times daily. Patient not taking: Reported on 08/11/2019 06/23/19   Virgel Manifold, MD  potassium chloride (KLOR-CON) 10 MEQ tablet Take 1 tablet (10 mEq total) by mouth as needed when taking Lasix Patient not taking: Reported on 08/11/2019 08/08/19   Jettie Booze, MD    Allergies    Other, Phenergan [promethazine], and Tramadol  Review of Systems   Review of Systems  Constitutional: Positive for fatigue. Negative for fever.  Respiratory: Positive for shortness of breath. Negative for cough.   Cardiovascular: Positive for chest pain and leg swelling.  Gastrointestinal: Negative for  abdominal pain, nausea and vomiting.  Genitourinary: Negative for dysuria and hematuria.  Neurological: Negative for headaches.  All other systems reviewed and are negative.   Physical Exam Updated Vital Signs BP (!) 157/89   Pulse 69   Temp 98.8 F (37.1 C) (Oral)   Resp 15   Ht 5\' 5"  (1.651 m)   Wt 94.8 kg   SpO2 98%   BMI 34.78 kg/m   Physical Exam Vitals and  nursing note reviewed.  Constitutional:      Appearance: Normal appearance. He is well-developed.  HENT:     Head: Normocephalic and atraumatic.  Eyes:     General: Lids are normal.     Conjunctiva/sclera: Conjunctivae normal.     Pupils: Pupils are equal, round, and reactive to light.  Cardiovascular:     Rate and Rhythm: Normal rate and regular rhythm.     Pulses: Normal pulses.          Radial pulses are 2+ on the right side and 2+ on the left side.       Dorsalis pedis pulses are 2+ on the right side and 2+ on the left side.     Heart sounds: Normal heart sounds. No murmur heard.  No friction rub. No gallop.   Pulmonary:     Effort: Pulmonary effort is normal.     Breath sounds: Normal breath sounds.     Comments: Lungs clear to auscultation bilaterally.  Symmetric chest rise.  No wheezing, rales, rhonchi. No evidence of respiratory distress.  Abdominal:     Palpations: Abdomen is soft. Abdomen is not rigid.     Tenderness: There is no abdominal tenderness. There is no guarding.  Musculoskeletal:        General: Normal range of motion.     Cervical back: Full passive range of motion without pain.     Comments: 2+ pitting edema noted bilateral lower extremities that begins at the proximal tib-fib region and extends distally.  No overlying warmth, erythema.  Skin:    General: Skin is warm and dry.     Capillary Refill: Capillary refill takes less than 2 seconds.  Neurological:     Mental Status: He is alert and oriented to person, place, and time.  Psychiatric:        Speech: Speech normal.     ED Results / Procedures / Treatments   Labs (all labs ordered are listed, but only abnormal results are displayed) Labs Reviewed  COMPREHENSIVE METABOLIC PANEL - Abnormal; Notable for the following components:      Result Value   Glucose, Bld 61 (*)    Creatinine, Ser 1.48 (*)    GFR calc non Af Amer 45 (*)    GFR calc Af Amer 52 (*)    All other components within normal limits   CBC WITH DIFFERENTIAL/PLATELET - Abnormal; Notable for the following components:   RBC 4.20 (*)    All other components within normal limits  D-DIMER, QUANTITATIVE (NOT AT [facility]) - Abnormal; Notable for the following components:   D-Dimer, Quant 1.24 (*)    All other components within normal limits  CBG MONITORING, ED - Abnormal; Notable for the following components:   Glucose-Capillary 48 (*)    All other components within normal limits  SARS CORONAVIRUS 2 BY RT PCR (HOSPITAL ORDER, [facility])  BRAIN NATRIURETIC PEPTIDE  SODIUM, URINE, RANDOM  CBG MONITORING, ED  TROPONIN I (HIGH SENSITIVITY)  TROPONIN I (HIGH SENSITIVITY)  EKG EKG Interpretation  Date/Time:  Monday August 11 2019 16:31:21 EDT Ventricular Rate:  65 PR Interval:    QRS Duration: 90 QT Interval:  411 QTC Calculation: 428 R Axis:   -4 Text Interpretation: Sinus rhythm Low voltage, precordial leads RSR' in V1 or V2, right VCD or RVH since last tracing no significant change Confirmed by Malvin Johns [phone]) on 08/11/2019 5:39:53 PM   Radiology DG Chest 2 View  Result Date: 08/11/2019 CLINICAL DATA:  79 year old male with chest pain. EXAM: CHEST - 2 VIEW COMPARISON:  Chest radiograph dated 01/12/2019. FINDINGS: Patchy area of airspace density at the left lung base which may represent atelectasis or infiltrate. Clinical correlation and follow-up to resolution recommended. There is no pleural effusion or pneumothorax. Mild cardiomegaly. Atherosclerotic calcification of the aorta. Degenerative changes of the spine. No acute osseous pathology. Lower cervical ACDF. Cholecystectomy clips. IMPRESSION: Left lung base density concerning for pneumonia. Clinical correlation and follow-up to resolution recommended. Electronically Signed   By: Anner Crete M.D.   On: 08/11/2019 17:16   CT Angio Chest PE W and/or Wo Contrast  Result Date: 08/11/2019 CLINICAL DATA:  Positive D-dimer, PE suspected,  history of prior pulmonary embolism EXAM: CT ANGIOGRAPHY CHEST WITH CONTRAST TECHNIQUE: Multidetector CT imaging of the chest was performed using the standard protocol during bolus administration of intravenous contrast. Multiplanar CT image reconstructions and MIPs were obtained to evaluate the vascular anatomy. CONTRAST:  20mL OMNIPAQUE IOHEXOL 350 MG/ML SOLN COMPARISON:  CT PA 06/30/2012 FINDINGS: Cardiovascular: Satisfactory opacification of the pulmonary arteries to the segmental level. No evidence of acute or residual chronic pulmonary artery emboli. Mild dilatation of the pulmonary trunk. Cardiac size within normal limits. No pericardial effusion. Three-vessel coronary artery disease is noted. Atherosclerotic plaque within the normal caliber aorta. Suboptimal opacification of the aorta for luminal evaluation. No acute periaortic abnormality. Shared origin of the brachiocephalic and left common carotid artery with minimal plaque in the proximal great vessels. No major venous abnormalities. Mediastinum/Nodes: No mediastinal fluid or gas. Normal thyroid gland and thoracic inlet. No acute abnormality of the trachea. Small hiatal hernia without other acute abnormality of thoracic esophagus. No worrisome mediastinal, hilar or axillary adenopathy. Lungs/Pleura: Atelectatic changes are present throughout the lungs, likely accentuated by imaging during exhalation for the angiographic technique. There is some mild diffuse airways thickening however with scattered secretions. No consolidation, features of edema, pneumothorax, or effusion. No suspicious pulmonary nodules or masses. Upper Abdomen: Prior cholecystectomy. Mild nonspecific bilateral perinephric stranding. Can be seen with advanced age/senescent change. Musculoskeletal: Multilevel degenerative changes are present in the imaged portions of the spine. Multilevel flowing anterior osteophytosis, compatible with features of diffuse idiopathic skeletal hyperostosis  (DISH). Additional degenerative changes in the bilateral shoulders. No acute or worrisome osseous lesions. No suspicious chest wall lesions. Review of the MIP images confirms the above findings. IMPRESSION: 1. No evidence of acute or chronic residual pulmonary artery emboli. 2. Mild dilatation of the pulmonary trunk, can be seen with pulmonary arterial hypertension. 3. Atelectatic changes throughout the lungs, likely accentuated by imaging during exhalation for the angiographic technique. 4. Mild diffuse airways thickening may reflect bronchitic change either acute or chronic. 5. Coronary artery calcifications are present. Please note that the presence of coronary artery calcium documents the presence of coronary artery disease, the severity of this disease and any potential stenosis cannot be assessed on this non-gated CT examination. 6. Aortic Atherosclerosis (ICD10-I70.0). Electronically Signed   By: Lovena Le M.D.   On: 08/11/2019 19:44  Procedures Procedures (including critical care time)  Medications Ordered in ED Medications  nitroGLYCERIN (NITROSTAT) SL tablet 0.4 mg (has no administration in time range)  furosemide (LASIX) injection 60 mg (has no administration in time range)  enoxaparin (LOVENOX) injection 95 mg (has no administration in time range)  sodium chloride 0.9 % bolus 500 mL (0 mLs Intravenous Stopped 08/11/19 2210)  dextrose 50 % solution 50 mL (50 mLs Intravenous Given 08/11/19 1849)  iohexol (OMNIPAQUE) 350 MG/ML injection 75 mL (75 mLs Intravenous Contrast Given 08/11/19 1931)    ED Course  I have reviewed the triage vital signs and the nursing notes.  Pertinent labs & imaging results that were available during my care of the patient were reviewed by me and considered in my medical decision making (see chart for details).    MDM Rules/Calculators/A&P HEAR Score: 53                        79 year old male with past history of CAD, PCI stent, hypertension, hyperlipidemia,  diabetes, PE (currently on Xarelto) who presents for evaluation of chest pain, shortness of breath and fatigue.  This has been ongoing for the last week or so but worse in the last few days.  Today, could not even make it to the mailbox.  On initial ED arrival, he is afebrile, nontoxic-appearing.  Vital signs are stable.  No evidence of respiratory distress.  Patient given nitro by EMS which improved his chest pain but still having 6/10 pain.  Concern for ACS etiology such as MI versus NSTEMI versus angina.  Concern for infectious etiology.  Plan to check labs, EKG, chest x-ray.  Chest x-ray concerning for possible pneumonia in the left lower lung.  Initial trop negative.  D-dimer is elevated at 1.24.  BNP is 70 point 0.3.  CMP shows normal BUN.  Creatinine is 1.48.  CBC shows no leukocytosis.  Hemoglobin stable at 13.3.  Given elevations in D-dimer, will plan for CTA of chest for evaluation of PE.  CTA shows no evidence of PE.  Mild dilatation of the pulmonary trunk noted.   Given patient's history/risk factors, he has a HEART score of 5.  At this time, given his worsening progression of symptoms as well as his risk factors, feel that admission with trending his troponins and observation is warranted.  Discussed patient with Dr. Gasper Sells (cardiology) agrees with plan for admission.  Recommends medical admission given patient's complex history with cardiology consulting as needed.  He will come evaluate the patient.  Discussed with Dr. Gasper Sells (Cardiology) after evaluation.  He will give Lasix for diuresis.  Will consult hospitalist for admission.   Portions of this note were generated with [employer]. Dictation errors may occur despite best attempts at proofreading.  Final Clinical Impression(s) / ED Diagnoses Final diagnoses:  Nonspecific chest pain  Shortness of breath    Rx / DC Orders ED Discharge Orders    None       Desma Mcgregor 08/11/19 2215     Malvin Johns, MD 08/11/19 2342

## 2019-08-11 NOTE — ED Notes (Signed)
Quincy Sheehan, RN aware that pt had 235 CBG

## 2019-08-11 NOTE — ED Notes (Signed)
Pt eating a turkey sandwich at this time. 

## 2019-08-11 NOTE — ED Triage Notes (Signed)
Pt to ED via [facility] with c/o  Days of left chest pain radiating down left arm.  EMS gave ASA 324mg  and NTG 1 SL with relief

## 2019-08-11 NOTE — Progress Notes (Signed)
ANTICOAGULATION CONSULT NOTE - Initial Consult  Pharmacy Consult for Lovenox Indication: chest pain/ACS/hx PE  Allergies  Allergen Reactions  . Other Other (See Comments)    Per patient- cardiac cath dye-  "woke up during procedure hysterical."  . Phenergan [Promethazine] Other (See Comments)    Mood changes   . Tramadol     Patient Measurements: Height: 5\' 5"  (165.1 cm) Weight: 94.8 kg (209 lb) IBW/kg (Calculated) : 61.5  Vital Signs: Temp: 98.8 F (37.1 C) (08/02 1632) Temp Source: Oral (08/02 1632) BP: 157/89 (08/02 1900) Pulse Rate: 69 (08/02 1900)  Labs: Recent Labs    08/11/19 1730  HGB 13.3  HCT 39.6  PLT 199  CREATININE 1.48*  TROPONINIHS 12    Estimated Creatinine Clearance: 44.2 mL/min (A) (by C-G formula based on SCr of 1.48 mg/dL (H)).   Medical History: Past Medical History:  Diagnosis Date  . Abnormal prostate biopsy   . Anticoagulant long-term use    currently xarelto  . BPH with elevated PSA   . CKD (chronic kidney disease), stage II   . Complication of anesthesia    limted neck rom limited use of left arm due to cva  . Coronary artery disease    CARDIOLOGIST-  DR Irish Lack--  2010-- PCI w/ stenting midLAD  . DDD (degenerative disc disease), lumbar   . Degeneration of cervical intervertebral disc   . Depression   . Dyspnea on exertion   . GERD (gastroesophageal reflux disease)   . Hemiparesis due to cerebral infarction   . History of cerebrovascular accident (CVA) with residual deficit 2002 and 2003--  hemiparisis both sides   per MRI  anterior left frontal lobe, left para midline pons, and inferior cerebullam bilaterally infarcts  . History of pulmonary embolus (PE)    06-30-2012  extensive bilaterally  . History of recurrent TIAs   . History of syncope    hx multiple pre-syncope and syncopal episodes due to vasovagal, orthostatic hypotension, dehydration  . History of TIAs    several since 2002  . Hyperlipidemia   . Hypertension    . Mild atherosclerosis of carotid artery, bilateral    per last duplex 11-04-2014  bilateral ICA 1--39%  . Neuropathy    fingers  . OSA on CPAP    followed by dr dohmeier--  sev. osa w/ AHI 65.9  . Prostate cancer ([place]) dx 2018  . Renal insufficiency   . S/P coronary artery stent placement 2010   stenting to mid LAD  . Simple renal cyst    bilaterally  . Stroke ([place])   . Trigger finger of both hands 11-17-13  . Type 2 diabetes mellitus ([place]) dx 1986   last one A1c 9.2 on 04-26-2016  . Unsteady gait    . Hx prior CVA/TIAs;  . Vertebral artery occlusion, left    chronic   Assessment: 46 YOM presenting with CP, hx CAD.  Hx PE on Xarelto PTA, last dose 8/2 AM.    Goal of Therapy:  Anti-Xa level 0.6-1 units/ml 4hrs after LMWH dose given Monitor platelets by anticoagulation protocol: Yes   Plan:  Lovenox 1mg /kg (95 mg) SQ every 12 hours Monitor renal function, s/s bleeding, ability to transition back to [place], PharmD Clinical Pharmacist ED Pharmacist Phone # [phone] 08/11/2019 9:40 PM

## 2019-08-11 NOTE — Consult Note (Signed)
Cardiology Consultation:   Patient ID: Steve Brown MRN: [id number]; DOB: [birth year]  Admit date: 08/11/2019 Date of Consult: 08/11/2019  Primary Care Provider: Lawerance Cruel, MD [facility] Cardiologist: Larae Grooms, MD  [facility] Electrophysiologist:  None    Patient Profile:   Steve Brown is a 79 y.o. male with a hx of CAD s/p Prox LAD Stent in 2010, last LCP 2014; PE on Xarelto, CKD IIIB Creatine ~ 1.48; IDDM (Type II), Prior stroke 3/21 without residual deficit, OSA  who is being seen today for the evaluation of chest pain and shortness of breath at the request of ED Team [facility]).  History of Present Illness:   Steve Brown is a 79 yo M who presents after one month of DOE and reduced exercise intolerance.  Was able to walk 2 miles at [facility]. Since past one week, patient has increased shortness of breath, DOE to mailbox, and bendopnea; able to lie flat and denies PND/orthopnea.  Patient has also begin to develop chest pain and chest pressure radiating to arm.  Non exertional lead to EMS eval.  Given Nitro both by EMS and in ED with improvement of sx; presently CP free.  Last Xarelto use 08/11/19.    In ED, D Dimer elevated, CT PE showed prior stent, No PE, no florid pulmonary edema.  NT proBNP 93 (no comparison in sx). 1st hsTrop 12.  Patient for observation for CP syndrome and shortness of breath.  Past Medical History:  Diagnosis Date  . Abnormal prostate biopsy   . Anticoagulant long-term use    currently xarelto  . BPH with elevated PSA   . CKD (chronic kidney disease), stage II   . Complication of anesthesia    limted neck rom limited use of left arm due to cva  . Coronary artery disease    CARDIOLOGIST-  DR Irish Lack--  2010-- PCI w/ stenting midLAD  . DDD (degenerative disc disease), lumbar   . Degeneration of cervical intervertebral disc   . Depression   . Dyspnea on exertion   . GERD (gastroesophageal reflux disease)   . Hemiparesis due to cerebral  infarction   . History of cerebrovascular accident (CVA) with residual deficit 2002 and 2003--  hemiparisis both sides   per MRI  anterior left frontal lobe, left para midline pons, and inferior cerebullam bilaterally infarcts  . History of pulmonary embolus (PE)    06-30-2012  extensive bilaterally  . History of recurrent TIAs   . History of syncope    hx multiple pre-syncope and syncopal episodes due to vasovagal, orthostatic hypotension, dehydration  . History of TIAs    several since 2002  . Hyperlipidemia   . Hypertension   . Mild atherosclerosis of carotid artery, bilateral    per last duplex 11-04-2014  bilateral ICA 1--39%  . Neuropathy    fingers  . OSA on CPAP    followed by dr dohmeier--  sev. osa w/ AHI 65.9  . Prostate cancer ([place]) dx 2018  . Renal insufficiency   . S/P coronary artery stent placement 2010   stenting to mid LAD  . Simple renal cyst    bilaterally  . Stroke ([place])   . Trigger finger of both hands 11-17-13  . Type 2 diabetes mellitus ([place]) dx 1986   last one A1c 9.2 on 04-26-2016  . Unsteady gait    . Hx prior CVA/TIAs;  . Vertebral artery occlusion, left    chronic    Past Surgical History:  Procedure  Laterality Date  . ANTERIOR CERVICAL DECOMP/DISCECTOMY FUSION  2004   C3 -- C6 limited rom  . CARDIAC CATHETERIZATION  06-10-2010   dr Irish Lack   wide patent LAD stent, mid lesion at the origin of the septal prior to the previous stent 40-50%/  normal LVF, ef 55%  . CARDIOVASCULAR STRESS TEST  10-23-2012  dr Irish Lack   normal nuclear perfusion study w/ no ischemia/  normal LV function and wall motion , ef 65%  . CARPAL TUNNEL RELEASE Bilateral   . CATARACT EXTRACTION W/ INTRAOCULAR LENS  IMPLANT, BILATERAL    . CHOLECYSTECTOMY N/A 11/02/2015   Procedure: LAPAROSCOPIC CHOLECYSTECTOMY WITH INTRAOPERATIVE CHOLANGIOGRAM;  Surgeon: Donnie Mesa, MD;  Location: [place];  Service: General;  Laterality: N/A;  . COLONOSCOPY    . CORONARY ANGIOPLASTY WITH  STENT PLACEMENT  02/2008   stenting to mid LAD  . GOLD SEED IMPLANT N/A 11/15/2016   Procedure: GOLD SEED IMPLANT TIMES THREE;  Surgeon: Ardis Hughs, MD;  Location: [facility];  Service: Urology;  Laterality: N/A;  . IR ANGIO INTRA EXTRACRAN SEL COM CAROTID INNOMINATE BILAT MOD SED  06/13/2018  . IR ANGIO VERTEBRAL SEL VERTEBRAL UNI R MOD SED  06/13/2018  . IR US GUIDE VASC ACCESS RIGHT  06/13/2018  . LEFT HEART CATH AND CORONARY ANGIOGRAPHY N/A 05/25/2017   Procedure: LEFT HEART CATH AND CORONARY ANGIOGRAPHY;  Surgeon: Jettie Booze, MD;  Location: [place] CV LAB;  Service: Cardiovascular;  Laterality: N/A;  . LEFT HEART CATHETERIZATION WITH CORONARY ANGIOGRAM N/A 04/03/2013   Procedure: LEFT HEART CATHETERIZATION WITH CORONARY ANGIOGRAM;  Surgeon: Jettie Booze, MD;  Location: [facility] CATH LAB;  Service: Cardiovascular;  Laterality: N/A;  patent mLAD stent  w/ mild disease in remainder LAD and its branches;  mod. focal lesion midLCFx- FFR of lesion was negative for ischemia/  normal LVSF, ef 50%  . lungs  2005   "fluid pumped off lungs"  . NEUROPLASTY / TRANSPOSITION ULNAR NERVE AT ELBOW Right 2004  . PROSTATE BIOPSY N/A 08/31/2016   Procedure: PROSTATE  BIOPSY TRANSRECTAL ULTRASONIC PROSTATE (TUBP);  Surgeon: Ardis Hughs, MD;  Location: [facility];  Service: Urology;  Laterality: N/A;  . SPACE OAR INSTILLATION N/A 11/15/2016   Procedure: SPACE OAR INSTILLATION;  Surgeon: Ardis Hughs, MD;  Location: [facility];  Service: Urology;  Laterality: N/A;  . TRANSTHORACIC ECHOCARDIOGRAM  04/27/2016   severe focal basal LVH, ef 60-65%,  grade 2 diastoilc dysfunction/  mild AR, MR, and TR/  atrial septum lipomatous hypertrophy/  PASP 48mmHg  . UMBILICAL HERNIA REPAIR      Home Medications:  Prior to Admission medications   Medication Sig Start Date End Date Taking? Authorizing Provider  acetaminophen (TYLENOL) 500 MG tablet  Take 1,000 mg by mouth every 6 (six) hours as needed (pain).    Yes [provider]  Ascorbic Acid (VITAMIN C PO) Take 1 tablet by mouth daily.   Yes [provider]  cholecalciferol (VITAMIN D) 1000 UNITS tablet Take 1,000 Units by mouth daily.   Yes [provider]  diclofenac sodium (VOLTAREN) 1 % GEL Apply 1 application topically See admin instructions. Apply topically once daily , may use an additional time as needed for pain 10/16/16  Yes [provider]  Echinacea 450 MG CAPS Take 450 mg by mouth daily.    Yes [provider]  fluticasone (FLONASE) 50 MCG/ACT nasal spray Place 1 spray into both nostrils  2 (two) times daily as needed for allergies.  09/26/16  Yes [provider]  furosemide (LASIX) 20 MG tablet Take 1-2 tablets daily as needed for swelling. Patient taking differently: Take 20 mg by mouth daily.  08/08/19  Yes Jettie Booze, MD  insulin lispro (HUMALOG KWIKPEN) 100 UNIT/ML KiwkPen Inject 6-10 Units into the skin 3 (three) times daily with meals.   Yes [provider]  losartan (COZAAR) 100 MG tablet Take 100 mg by mouth daily. 03/29/18  Yes [provider]  Multiple Vitamin (MULTIVITAMIN WITH MINERALS) TABS tablet Take 1 tablet by mouth daily.   Yes [provider]  nitroGLYCERIN (NITROSTAT) 0.4 MG SL tablet Place 1 tablet (0.4 mg total) under the tongue every 5 (five) minutes as needed for chest pain. 12/07/15  Yes Jettie Booze, MD  pregabalin (LYRICA) 300 MG capsule Take 1 capsule (300 mg total) by mouth 2 (two) times daily. 07/26/12  Yes Dohmeier, Asencion Partridge, MD  rivaroxaban (XARELTO) 20 MG TABS tablet Take 20 mg by mouth daily.    Yes [provider]  TRESIBA FLEXTOUCH 100 UNIT/ML SOPN FlexTouch Pen Inject 26 Units into the skin every morning.  05/11/18  Yes [provider]  valACYclovir (VALTREX) 500 MG tablet Take 500 mg by mouth daily.   Yes [provider]    zinc gluconate 50 MG tablet Take 50 mg by mouth daily.   Yes [provider]  gabapentin (NEURONTIN) 100 MG capsule Take 1 capsule (100 mg total) by mouth 3 (three) times daily. Patient not taking: Reported on 08/11/2019 06/23/19   Virgel Manifold, MD  potassium chloride (KLOR-CON) 10 MEQ tablet Take 1 tablet (10 mEq total) by mouth as needed when taking Lasix Patient not taking: Reported on 08/11/2019 08/08/19   Jettie Booze, MD   PRN Meds: nitroGLYCERIN  Allergies:    Allergies  Allergen Reactions  . Other Other (See Comments)    Per patient- cardiac cath dye-  "woke up during procedure hysterical."  . Phenergan [Promethazine] Other (See Comments)    Mood changes   . Tramadol     Social History:   Social History   Socioeconomic History  . Marital status: Divorced    Spouse name: Not on file  . Number of children: 4  . Years of education: 48  . Highest education level: Not on file  Occupational History    Employer: RETIRED    Comment: retired  Tobacco Use  . Smoking status: Never Smoker  . Smokeless tobacco: Never Used  Vaping Use  . Vaping Use: Never used  Substance and Sexual Activity  . Alcohol use: No    Alcohol/week: 0.0 standard drinks  . Drug use: No  . Sexual activity: Not Currently  Other Topics Concern  . Not on file  Social History Narrative   Patient lives at home alone and he is single.  Patient is retired.    Caffeine - one cups daily.   Right handed.   Patient has a high school education.   Patient has four adult children.   Social Determinants of Health   Financial Resource Strain: Low Risk   . Difficulty of Paying Living Expenses: Not hard at all  Food Insecurity: No Food Insecurity  . Worried About Charity fundraiser in the Last Year: Never true  . Ran Out of Food in the Last Year: Never true  Transportation Needs:   . Lack of Transportation (Medical):   Marland Kitchen Lack of Transportation (  Non-Medical):   Physical Activity:  Insufficiently Active  . Days of Exercise per Week: 4 days  . Minutes of Exercise per Session: 30 min  Stress:   . Feeling of Stress :   Social Connections:   . Frequency of Communication with Friends and Family:   . Frequency of Social Gatherings with Friends and Family:   . Attends Religious Services:   . Active Member of Clubs or Organizations:   . Attends Archivist Meetings:   Marland Kitchen Marital Status:   Intimate Partner Violence:   . Fear of Current or Ex-Partner:   . Emotionally Abused:   Marland Kitchen Physically Abused:   . Sexually Abused:     Family History:   Denies new hx of MI in family, denies hx of HF in family Family History  Problem Relation Age of Onset  . Aneurysm Mother   . Cancer Father        unknown either pancreatic or prostate  . Stroke Brother   . Dementia Sister   . Heart attack Neg Hx     ROS:  Please see the history of present illness.  Denies bleeding issues (coughing up blood, blood in urine stool, bruising) on Xarelto. All other ROS reviewed and negative.     Physical Exam/Data:   Vitals:   08/11/19 1631 08/11/19 1632 08/11/19 1900  BP:  114/78 (!) 157/89  Pulse:  60 69  Resp:  19 15  Temp:  98.8 F (37.1 C)   TempSrc:  Oral   SpO2:  95% 98%  Weight: 94.8 kg    Height: 5\' 5"  (1.651 m)     No intake or output data in the 24 hours ending 08/11/19 2107 Last 3 Weights 08/11/2019 06/23/2019 03/20/2019  Weight (lbs) 209 lb 205 lb 205 lb  Weight (kg) 94.802 kg 92.987 kg 92.987 kg     Body mass index is 34.78 kg/m.  General:  Obese Male , in no acute distress HEENT: normal Lymph: no adenopathy Neck: difficult JVD assessment Cardiac:  normal S1, S2; RRR; no murmur  Lungs:  Good air movement, crackles in the lung bases Abd: soft, nontender, dullness to percussion with positive fluid wave Ext: warm but with 2+ bilateral pitting edema Musculoskeletal:  No deformities, BUE and BLE strength normal and equal Skin: warm without rash on visible  skin Psych:  Normal affect   EKG:  The EKG was personally reviewed and demonstrates:  Sinus rhythm, rate of 65, no ST/T changes, no q waves, evidence of RVH (V2), iRBBB Telemetry:  Telemetry was personally reviewed and demonstrates:  Sinus rhythm to sinus bradycardia  Relevant CV Studies: LCP 2019:   "Acute Mrg lesion is 25% stenosed.  Mid RCA lesion is 25% stenosed.  2nd Mrg lesion is 40% stenosed.  Mid Cx lesion is 40% stenosed.  Previously placed Mid LAD stent (unknown type) is widely patent.  Prox LAD-1 lesion is 25% stenosed.  Prox LAD-2 lesion is 25% stenosed.  The left ventricular systolic function is normal.  LV end diastolic pressure is normal.  The left ventricular ejection fraction is 55-65% by visual estimate.  There is no aortic valve stenosis.  Recent Echo 1. Normal GLS -17.5. Left ventricular ejection fraction, by estimation,  is 60 to 65%. The left ventricle has normal function. The left ventricle  has no regional wall motion abnormalities. Left ventricular diastolic  parameters were normal.  2. Right ventricular systolic function is normal. The right ventricular  size is normal. There is  mildly elevated pulmonary artery systolic  pressure.  3. The mitral valve is normal in structure. Trivial mitral valve  regurgitation. No evidence of mitral stenosis.  4. The aortic valve is tricuspid. Aortic valve regurgitation is trivial.  Mild aortic valve sclerosis is present, with no evidence of aortic valve  stenosis.  5. Mild pulmonic stenosis.  6. The inferior vena cava is normal in size with greater than 50% respiratory variability, suggesting right atrial pressure of 3 mmHg.   Laboratory Data:  High Sensitivity Troponin:   Recent Labs  Lab 08/11/19 1730  TROPONINIHS 12     Chemistry Recent Labs  Lab 08/06/19 1243 08/11/19 1730  NA 140 141  K 4.1 3.9  CL 104 103  CO2 27 29  GLUCOSE 127* 61*  BUN 20 18  CREATININE 1.46* 1.48*  CALCIUM  9.4 9.5  GFRNONAA 46* 45*  GFRAA 53* 52*  ANIONGAP  --  9    Recent Labs  Lab 08/11/19 1730  PROT 6.7  ALBUMIN 3.6  AST 21  ALT 19  ALKPHOS 65  BILITOT 0.9   Hematology Recent Labs  Lab 08/11/19 1730  WBC 5.4  RBC 4.20*  HGB 13.3  HCT 39.6  MCV 94.3  MCH 31.7  MCHC 33.6  RDW 12.6  PLT 199   BNP Recent Labs  Lab 08/06/19 1243 08/11/19 1730  BNP  --  70.3  PROBNP 93  --     DDimer  Recent Labs  Lab 08/11/19 1742  DDIMER 1.24*    Radiology/Studies:  DG Chest 2 View  Result Date: 08/11/2019 CLINICAL DATA:  79 year old male with chest pain. EXAM: CHEST - 2 VIEW COMPARISON:  Chest radiograph dated 01/12/2019. FINDINGS: Patchy area of airspace density at the left lung base which may represent atelectasis or infiltrate. Clinical correlation and follow-up to resolution recommended. There is no pleural effusion or pneumothorax. Mild cardiomegaly. Atherosclerotic calcification of the aorta. Degenerative changes of the spine. No acute osseous pathology. Lower cervical ACDF. Cholecystectomy clips. IMPRESSION: Left lung base density concerning for pneumonia. Clinical correlation and follow-up to resolution recommended. Electronically Signed   By: Anner Crete M.D.   On: 08/11/2019 17:16   CT Angio Chest PE W and/or Wo Contrast  Result Date: 08/11/2019 CLINICAL DATA:  Positive D-dimer, PE suspected, history of prior pulmonary embolism EXAM: CT ANGIOGRAPHY CHEST WITH CONTRAST TECHNIQUE: Multidetector CT imaging of the chest was performed using the standard protocol during bolus administration of intravenous contrast. Multiplanar CT image reconstructions and MIPs were obtained to evaluate the vascular anatomy. CONTRAST:  75mL OMNIPAQUE IOHEXOL 350 MG/ML SOLN COMPARISON:  CT PA 06/30/2012 FINDINGS: Cardiovascular: Satisfactory opacification of the pulmonary arteries to the segmental level. No evidence of acute or residual chronic pulmonary artery emboli. Mild dilatation of the  pulmonary trunk. Cardiac size within normal limits. No pericardial effusion. Three-vessel coronary artery disease is noted. Atherosclerotic plaque within the normal caliber aorta. Suboptimal opacification of the aorta for luminal evaluation. No acute periaortic abnormality. Shared origin of the brachiocephalic and left common carotid artery with minimal plaque in the proximal great vessels. No major venous abnormalities. Mediastinum/Nodes: No mediastinal fluid or gas. Normal thyroid gland and thoracic inlet. No acute abnormality of the trachea. Small hiatal hernia without other acute abnormality of thoracic esophagus. No worrisome mediastinal, hilar or axillary adenopathy. Lungs/Pleura: Atelectatic changes are present throughout the lungs, likely accentuated by imaging during exhalation for the angiographic technique. There is some mild diffuse airways thickening however with scattered secretions. No consolidation,  features of edema, pneumothorax, or effusion. No suspicious pulmonary nodules or masses. Upper Abdomen: Prior cholecystectomy. Mild nonspecific bilateral perinephric stranding. Can be seen with advanced age/senescent change. Musculoskeletal: Multilevel degenerative changes are present in the imaged portions of the spine. Multilevel flowing anterior osteophytosis, compatible with features of diffuse idiopathic skeletal hyperostosis (DISH). Additional degenerative changes in the bilateral shoulders. No acute or worrisome osseous lesions. No suspicious chest wall lesions. Review of the MIP images confirms the above findings. IMPRESSION: 1. No evidence of acute or chronic residual pulmonary artery emboli. 2. Mild dilatation of the pulmonary trunk, can be seen with pulmonary arterial hypertension. 3. Atelectatic changes throughout the lungs, likely accentuated by imaging during exhalation for the angiographic technique. 4. Mild diffuse airways thickening may reflect bronchitic change either acute or chronic.  5. Coronary artery calcifications are present. Please note that the presence of coronary artery calcium documents the presence of coronary artery disease, the severity of this disease and any potential stenosis cannot be assessed on this non-gated CT examination. 6. Aortic Atherosclerosis (ICD10-I70.0). Electronically Signed   By: Lovena Le M.D.   On: 08/11/2019 19:44   ECHOCARDIOGRAM COMPLETE  Result Date: 08/08/2019    ECHOCARDIOGRAM REPORT   Patient Name:   Steve Brown Date of Exam: 08/08/2019 Medical Rec #:  [id number]        Height:       65.0 in Accession #:    [id number]       Weight:       205.0 lb Date of Birth:  [birth year]         BSA:          1.999 m Patient Age:    39 years         BP:           136/76 mmHg Patient Gender: M                HR:           65 bpm. Exam Location:  [place] Procedure: 2D Echo, 3D Echo, Cardiac Doppler, Color Doppler and Strain Analysis Indications:    R06.00 Dyspnea on exertion  History:        Patient has prior history of Echocardiogram examinations, most                 recent 04/27/2016. CAD, TIA and Stroke, Signs/Symptoms:Syncope                 and Orthostatic hypotension.; Risk Factors:Hypertension, Sleep                 Apnea, Diabetes and Dyslipidemia. CKD stage 2. Anemia.  Sonographer:    Jessee Avers, RDCS Referring Phys: [place]  1. Normal GLS -17.5. Left ventricular ejection fraction, by estimation, is 60 to 65%. The left ventricle has normal function. The left ventricle has no regional wall motion abnormalities. Left ventricular diastolic parameters were normal.  2. Right ventricular systolic function is normal. The right ventricular size is normal. There is mildly elevated pulmonary artery systolic pressure.  3. The mitral valve is normal in structure. Trivial mitral valve regurgitation. No evidence of mitral stenosis.  4. The aortic valve is tricuspid. Aortic valve regurgitation is trivial. Mild aortic valve sclerosis  is present, with no evidence of aortic valve stenosis.  5. Mild pulmonic stenosis.  6. The inferior vena cava is normal in size with greater than 50% respiratory variability, suggesting right atrial pressure of  3 mmHg. Comparison(s): 04/27/16 EF 60-65%. GLS -18.4%. PA pressure 7mmHg. FINDINGS  Left Ventricle: Normal GLS -17.5. Left ventricular ejection fraction, by estimation, is 60 to 65%. The left ventricle has normal function. The left ventricle has no regional wall motion abnormalities. The left ventricular internal cavity size was normal  in size. There is no left ventricular hypertrophy. Left ventricular diastolic parameters were normal. Right Ventricle: The right ventricular size is normal. No increase in right ventricular wall thickness. Right ventricular systolic function is normal. There is mildly elevated pulmonary artery systolic pressure. The tricuspid regurgitant velocity is 2.87  m/s, and with an assumed right atrial pressure of 8 mmHg, the estimated right ventricular systolic pressure is 09.9 mmHg. Left Atrium: Left atrial size was normal in size. Right Atrium: Right atrial size was normal in size. Pericardium: There is no evidence of pericardial effusion. Mitral Valve: The mitral valve is normal in structure. Normal mobility of the mitral valve leaflets. Trivial mitral valve regurgitation. No evidence of mitral valve stenosis. Tricuspid Valve: The tricuspid valve is normal in structure. Tricuspid valve regurgitation is mild . No evidence of tricuspid stenosis. Aortic Valve: The aortic valve is tricuspid. Aortic valve regurgitation is trivial. Aortic regurgitation PHT measures 508 msec. Mild aortic valve sclerosis is present, with no evidence of aortic valve stenosis. Pulmonic Valve: The pulmonic valve was normal in structure. Pulmonic valve regurgitation is not visualized. Mild pulmonic stenosis. Aorta: The aortic root is normal in size and structure. Venous: The inferior vena cava is normal in size  with greater than 50% respiratory variability, suggesting right atrial pressure of 3 mmHg. IAS/Shunts: No atrial level shunt detected by color flow Doppler.  LEFT VENTRICLE PLAX 2D LVIDd:         5.00 cm  Diastology LVIDs:         2.80 cm  LV e' lateral:   7.38 cm/s LV PW:         1.10 cm  LV E/e' lateral: 10.8 LV IVS:        1.10 cm  LV e' medial:    5.27 cm/s LVOT diam:     2.00 cm  LV E/e' medial:  15.1 LV SV:         80 LV SV Index:   40       2D Longitudinal Strain LVOT Area:     3.14 cm 2D Strain GLS (A2C):   -18.8 %                         2D Strain GLS (A3C):   -17.2 %                         2D Strain GLS (A4C):   -16.5 %                         2D Strain GLS Avg:     -17.5 %                          3D Volume EF:                         3D EF:        56 %  LV EDV:       145 ml                         LV ESV:       64 ml                         LV SV:        81 ml RIGHT VENTRICLE RV Basal diam:  3.20 cm RV S prime:     12.70 cm/s TAPSE (M-mode): 2.3 cm RVSP:           40.9 mmHg LEFT ATRIUM             Index       RIGHT ATRIUM           Index LA diam:        3.50 cm 1.75 cm/m  RA Pressure: 8.00 mmHg LA Vol (A2C):   48.2 ml 24.11 ml/m RA Area:     15.20 cm LA Vol (A4C):   41.0 ml 20.51 ml/m RA Volume:   36.00 ml  18.01 ml/m LA Biplane Vol: 45.8 ml 22.91 ml/m  AORTIC VALVE LVOT Vmax:   120.00 cm/s LVOT Vmean:  76.100 cm/s LVOT VTI:    0.254 m AI PHT:      508 msec  AORTA Ao Root diam: 3.70 cm Ao Asc diam:  3.60 cm MITRAL VALVE               TRICUSPID VALVE                            TR Peak grad:   32.9 mmHg                            TR Vmax:        287.00 cm/s MV E velocity: 79.70 cm/s  Estimated RAP:  8.00 mmHg MV A velocity: 72.00 cm/s  RVSP:           40.9 mmHg MV E/A ratio:  1.11                            SHUNTS                            Systemic VTI:  0.25 m                            Systemic Diam: 2.00 cm Jenkins Rouge MD Electronically signed by Jenkins Rouge MD  Signature Date/Time: 08/08/2019/4:05:24 PM    Final      [id number]} HEAR Score (for undifferentiated chest pain):  HEAR Score: 5    New York Heart Association (NYHA) Functional Class NYHA Class III  Assessment and Plan:   1. Chest Pain Syndrome/Unstable Angina - subacute  (1 week) and worsening - no BB in the setting of intolerance (bradycardia, ? Of nocturnal bradycardia per patient) - At this time, will continue anticoagulation, in significant troponin elevation on change in clinical syndrome will add ASA (load) - trend troponin - will hold Xarelto last dose 08/11/19 AM and start Lovenox in AM; depending on clinical course, patient may need LCP during admission - continue nitro PRN - would keep NPO  at midnight for stress testing in AM (given 75 cc contrast load and CrCl 44; would prefer Stress CMR or Stress Echo; will discuss with AM colleagues for availability)  2. Question of HFpEF Decompensation - Lower extremity edema, bendopnea, and weight gain and abdominal swelling - but with NTproBNP of only 93 (in obese pt), Echo not suggestive of HFrEF with normal IVC per report on recent Echo and CT not showing florid pulmonary edema - will trial lasix 60 mg IV X1 (home dose is 20 mg PO)  In the setting of  Stable Insulin Dependent DM - Insulin mgmt while NPO at [place] per primary CKD Stage III b OSA Prior stroke PE 2014 on long term AC Obesity  For questions or updates, please contact [facility] Please consult [email or web address] for contact info under    Signed, Werner Lean, MD  08/11/2019 9:07 PM

## 2019-08-11 NOTE — H&P (Addendum)
[name] OMV:[id number] DOB: 05/19/1940 DOA: 08/11/2019     PCP: Lawerance Cruel, MD   Outpatient Specialists:   CARDS: [name]   Endcrinology Dr. Chalmers Cater  Patient arrived to ER on 08/11/19 at 1625 Referred by Attending Malvin Johns, MD   Patient coming from: home Lives alone,        Chief Complaint:   Chief Complaint  Patient presents with  . Chest Pain    HPI: Steve Brown is a 79 y.o. male with medical history significant of  CAD, LAD stent, CKD, PE (currently on Xarelto), hyperlipidemia, hypertension      Presented with left-sided chest pain radiating down to left arm EMS was called on arrival gave 324 mg of aspirin and 1 sublingual nitro with some relief Pain has been intermittent he is not sure if it is worse with exertion his shortness of breath and fatigue has been getting worse with exertion.  Today when he try to walk to the mailbox he could not do it because he got short of breath.  Pain is not worse with inspiration.  He has been feeling somewhat lightheaded today no syncope.  He has been having lower extremity edema for the past 2 months left worse than right.  It is better in the morning when he puts his legs up.  He is try to increase his Lasix dose. Patient is on Xarelto for history of PE which he thought was secondary to travel.  And has been taking it.  Reports that left shoulder pain has been bothering him since 2004 after neck surgery   Infectious risk factors:  Reports shortness of breath, severe fatigue   Has  been vaccinated against COVID    Initial COVID TEST  NEGATIVE   Lab Results  Component Value Date   [place] NEGATIVE 08/11/2019   [place] NOT DETECTED 10/05/2018   Regarding pertinent Chronic problems:    Hyperlipidemia -  Not on statins Lipid Panel     Component Value Date/Time   CHOL 144 03/21/2013 0400   TRIG 122 03/21/2013 0400   HDL 57 03/21/2013 0400   CHOLHDL 2.5 03/21/2013 0400   VLDL 24 03/21/2013  0400   LDLCALC 63 03/21/2013 0400   History of pulmonary embolism on Xarelto    HTN on Cozaar, does not tolerate BB  Recent echo showed no CHF done 08/08/2019   CAD  - On Aspirin,                 -  followed by cardiology                - last cardiac cath 2012     DM 2 -  Lab Results  Component Value Date   HGBA1C 9.2 (H) 04/26/2016   on insulin,       obesity-   BMI Readings from Last 1 Encounters:  08/11/19 34.78 kg/m      OSA -on nocturnal CPAP,     Hx of TIA-  with/out residual deficits not on Aspirin    CKD stage III  baseline Cr1.5 Estimated Creatinine Clearance: 44.2 mL/min (A) (by C-G formula based on SCr of 1.48 mg/dL (H)).  Lab Results  Component Value Date   CREATININE 1.48 (H) 08/11/2019   CREATININE 1.46 (H) 08/06/2019   CREATININE 1.51 (H) 07/22/2019        While in ER: Hypoglycemic to 40 -6 Was given LAsix IV 60 mg CTA neg for PE Trop 12 BNP 93  ER Provider Called:   Cardiology   Dr.Chandrasekhar They Recommend admit to medicine , Keep NPO change to lovenox in AM    seen in    in ER and will follow up   Hospitalist was called for admission for Chest pain evaluation   The following Work up has been ordered so far:  Orders Placed This Encounter  Procedures  . SARS Coronavirus 2 by RT PCR (hospital order, performed in [facility] hospital lab) Nasopharyngeal Nasopharyngeal Swab  . DG Chest 2 View  . CT Angio Chest PE W and/or Wo Contrast  . Comprehensive metabolic panel  . CBC with Differential  . Brain natriuretic peptide  . D-dimer, quantitative (not at [facility])  . Sodium, urine, random  . Diet NPO time specified Except for: [employer], Sips with Meds  . Patient may eat/drink  . Inpatient consult to Cardiology  ALL PATIENTS BEING ADMITTED/HAVING PROCEDURES NEED COVID-19 SCREENING  . ENOXAPARIN (LOVENOX) PER PHARMACY CONSULT  . Consult to hospitalist  ALL PATIENTS BEING ADMITTED/HAVING PROCEDURES NEED COVID-19 SCREENING  . CBG monitoring,  ED  . POC CBG, ED  . ED EKG  . [place]ine lock IV   Following Medications were ordered in ER: Medications  nitroGLYCERIN (NITROSTAT) SL tablet 0.4 mg (has no administration in time range)  enoxaparin (LOVENOX) injection 95 mg (has no administration in time range)  sodium chloride 0.9 % bolus 500 mL (0 mLs Intravenous Stopped 08/11/19 2210)  dextrose 50 % solution 50 mL (50 mLs Intravenous Given 08/11/19 1849)  iohexol (OMNIPAQUE) 350 MG/ML injection 75 mL (75 mLs Intravenous Contrast Given 08/11/19 1931)  furosemide (LASIX) injection 60 mg (60 mg Intravenous Given 08/11/19 2217)        Consult Orders  (From admission, onward)         Start     Ordered   08/11/19 2214  Consult to hospitalist  ALL PATIENTS BEING ADMITTED/HAVING PROCEDURES NEED COVID-19 SCREENING Paged by Jerene Pitch  Once       Comments: ALL PATIENTS BEING ADMITTED/HAVING PROCEDURES NEED COVID-19 SCREENING  Provider:  (Not yet assigned)  Question Answer Comment  Place call to: [facility]   Reason for Consult Admit      08/11/19 2213           Significant initial  Findings: Abnormal Labs Reviewed  COMPREHENSIVE METABOLIC PANEL - Abnormal; Notable for the following components:      Result Value   Glucose, Bld 61 (*)    Creatinine, Ser 1.48 (*)    GFR calc non Af Amer 45 (*)    GFR calc Af Amer 52 (*)    All other components within normal limits  CBC WITH DIFFERENTIAL/PLATELET - Abnormal; Notable for the following components:   RBC 4.20 (*)    All other components within normal limits  D-DIMER, QUANTITATIVE (NOT AT [facility]) - Abnormal; Notable for the following components:   D-Dimer, Quant 1.24 (*)    All other components within normal limits  CBG MONITORING, ED - Abnormal; Notable for the following components:   Glucose-Capillary 48 (*)    All other components within normal limits  CBG MONITORING, ED - Abnormal; Notable for the following components:   Glucose-Capillary 235 (*)    All other components within  normal limits    Otherwise labs showing:    Recent Labs  Lab 08/06/19 1243 08/11/19 1730  NA 140 141  K 4.1 3.9  CO2 27 29  GLUCOSE 127* 61*  BUN 20 18  CREATININE 1.46* 1.48*  CALCIUM 9.4 9.5    Cr    stable,    Lab Results  Component Value Date   CREATININE 1.48 (H) 08/11/2019   CREATININE 1.46 (H) 08/06/2019   CREATININE 1.51 (H) 07/22/2019    Recent Labs  Lab 08/11/19 1730  AST 21  ALT 19  ALKPHOS 65  BILITOT 0.9  PROT 6.7  ALBUMIN 3.6   Lab Results  Component Value Date   CALCIUM 9.5 08/11/2019   PHOS 3.0 05/19/2016      WBC       Component Value Date/Time   WBC 5.4 08/11/2019 1730   ANC    Component Value Date/Time   NEUTROABS 2.7 08/11/2019 1730   NEUTROABS 2.9 08/14/2012 1512   ALC No components found for: LYMPHAB    Plt: Lab Results  Component Value Date   PLT 199 08/11/2019       COVID-19 Labs  Recent Labs    08/11/19 1742  DDIMER 1.24*    Lab Results  Component Value Date   SARSCOV2NAA NOT DETECTED 10/05/2018    HG/HCT  stable,       Component Value Date/Time   HGB 13.3 08/11/2019 1730   HGB 12.5 (L) 05/17/2017 1255   HGB 13.1 08/14/2012 1512   HCT 39.6 08/11/2019 1730   HCT 38.6 05/17/2017 1255   HCT 38.8 08/14/2012 1512    No results for input(s): LIPASE, AMYLASE in the last 168 hours. No results for input(s): AMMONIA in the last 168 hours.    Troponin 12     ECG: Ordered Personally reviewed by me showing: HR : 65  Rhythm: NSR   no evidence of ischemic changes QTC 428   BNP (last 3 results) Recent Labs    01/12/19 1523 08/11/19 1730  BNP 53.7 70.3      DM  labs:  HbA1C: No results for input(s): HGBA1C in the last 8760 hours.     CBG (last 3)  Recent Labs    08/11/19 1841 08/11/19 2229  GLUCAP 48* 235*     UA   ordered      Ordered   CXR - /PNA  CTA chest - nonacute, no PE, no evidence of infiltrate     ED Triage Vitals  Enc Vitals Group     BP 08/11/19 1632 114/78     Pulse  Rate 08/11/19 1632 60     Resp 08/11/19 1632 19     Temp 08/11/19 1632 98.8 F (37.1 C)     Temp Source 08/11/19 1632 Oral     SpO2 08/11/19 1632 95 %     Weight 08/11/19 1631 209 lb (94.8 kg)     Height 08/11/19 1631 5\' 5"  (1.651 m)     Head Circumference --      Peak Flow --      Pain Score 08/11/19 1630 6     Pain Loc --      Pain Edu? --      Excl. in [place]? --   TMAX(24)@       Latest  Blood pressure (!) 147/86, pulse 62, temperature 98.8 F (37.1 C), temperature source Oral, resp. rate 15, height 5\' 5"  (1.651 m), weight 94.8 kg, SpO2 96 %.    Review of Systems:    Pertinent positives include:   dyspnea on exertion, chest pain Bilateral lower extremity swelling  Constitutional:  No weight loss, night sweats, Fevers, chills, fatigue, weight loss  HEENT:  No headaches,  Difficulty swallowing,Tooth/dental problems,Sore throat,  No sneezing, itching, ear ache, nasal congestion, post nasal drip,  Cardio-vascular:  No , Orthopnea, PND, anasarca, dizziness, palpitations.no GI:  No heartburn, indigestion, abdominal pain, nausea, vomiting, diarrhea, change in bowel habits, loss of appetite, melena, blood in stool, hematemesis Resp:  no shortness of breath at rest. No No excess mucus, no productive cough, No non-productive cough, No coughing up of blood.No change in color of mucus.No wheezing. Skin:  no rash or lesions. No jaundice GU:  no dysuria, change in color of urine, no urgency or frequency. No straining to urinate.  No flank pain.  Musculoskeletal:  No joint pain or no joint swelling. No decreased range of motion. No back pain.  Psych:  No change in mood or affect. No depression or anxiety. No memory loss.  Neuro: no localizing neurological complaints, no tingling, no weakness, no double vision, no gait abnormality, no slurred speech, no confusion  All systems reviewed and apart from [place] all are negative  Past Medical History:   Past Medical History:  Diagnosis Date   . Abnormal prostate biopsy   . Anticoagulant long-term use    currently xarelto  . BPH with elevated PSA   . CKD (chronic kidney disease), stage II   . Complication of anesthesia    limted neck rom limited use of left arm due to cva  . Coronary artery disease    CARDIOLOGIST-  DR Irish Lack--  2010-- PCI w/ stenting midLAD  . DDD (degenerative disc disease), lumbar   . Degeneration of cervical intervertebral disc   . Depression   . Dyspnea on exertion   . GERD (gastroesophageal reflux disease)   . Hemiparesis due to cerebral infarction   . History of cerebrovascular accident (CVA) with residual deficit 2002 and 2003--  hemiparisis both sides   per MRI  anterior left frontal lobe, left para midline pons, and inferior cerebullam bilaterally infarcts  . History of pulmonary embolus (PE)    06-30-2012  extensive bilaterally  . History of recurrent TIAs   . History of syncope    hx multiple pre-syncope and syncopal episodes due to vasovagal, orthostatic hypotension, dehydration  . History of TIAs    several since 2002  . Hyperlipidemia   . Hypertension   . Mild atherosclerosis of carotid artery, bilateral    per last duplex 11-04-2014  bilateral ICA 1--39%  . Neuropathy    fingers  . OSA on CPAP    followed by dr dohmeier--  sev. osa w/ AHI 65.9  . Prostate cancer ([place]) dx 2018  . Renal insufficiency   . S/P coronary artery stent placement 2010   stenting to mid LAD  . Simple renal cyst    bilaterally  . Stroke ([place])   . Trigger finger of both hands 11-17-13  . Type 2 diabetes mellitus ([place]) dx 1986   last one A1c 9.2 on 04-26-2016  . Unsteady gait    . Hx prior CVA/TIAs;  . Vertebral artery occlusion, left    chronic      Past Surgical History:  Procedure Laterality Date  . ANTERIOR CERVICAL DECOMP/DISCECTOMY FUSION  2004   C3 -- C6 limited rom  . CARDIAC CATHETERIZATION  06-10-2010   dr Irish Lack   wide patent LAD stent, mid lesion at the origin of the septal prior to  the previous stent 40-50%/  normal LVF, ef 55%  . CARDIOVASCULAR STRESS TEST  10-23-2012  dr Irish Lack   normal nuclear perfusion study w/ no  ischemia/  normal LV function and wall motion , ef 65%  . CARPAL TUNNEL RELEASE Bilateral   . CATARACT EXTRACTION W/ INTRAOCULAR LENS  IMPLANT, BILATERAL    . CHOLECYSTECTOMY N/A 11/02/2015   Procedure: LAPAROSCOPIC CHOLECYSTECTOMY WITH INTRAOPERATIVE CHOLANGIOGRAM;  Surgeon: Donnie Mesa, MD;  Location: [facility];  Service: General;  Laterality: N/A;  . COLONOSCOPY    . CORONARY ANGIOPLASTY WITH STENT PLACEMENT  02/2008   stenting to mid LAD  . GOLD SEED IMPLANT N/A 11/15/2016   Procedure: GOLD SEED IMPLANT TIMES THREE;  Surgeon: Ardis Hughs, MD;  Location: [facility];  Service: Urology;  Laterality: N/A;  . IR ANGIO INTRA EXTRACRAN SEL COM CAROTID INNOMINATE BILAT MOD SED  06/13/2018  . IR ANGIO VERTEBRAL SEL VERTEBRAL UNI R MOD SED  06/13/2018  . IR US GUIDE VASC ACCESS RIGHT  06/13/2018  . LEFT HEART CATH AND CORONARY ANGIOGRAPHY N/A 05/25/2017   Procedure: LEFT HEART CATH AND CORONARY ANGIOGRAPHY;  Surgeon: Jettie Booze, MD;  Location: [facility] CV LAB;  Service: Cardiovascular;  Laterality: N/A;  . LEFT HEART CATHETERIZATION WITH CORONARY ANGIOGRAM N/A 04/03/2013   Procedure: LEFT HEART CATHETERIZATION WITH CORONARY ANGIOGRAM;  Surgeon: Jettie Booze, MD;  Location: [facility] CATH LAB;  Service: Cardiovascular;  Laterality: N/A;  patent mLAD stent  w/ mild disease in remainder LAD and its branches;  mod. focal lesion midLCFx- FFR of lesion was negative for ischemia/  normal LVSF, ef 50%  . lungs  2005   "fluid pumped off lungs"  . NEUROPLASTY / TRANSPOSITION ULNAR NERVE AT ELBOW Right 2004  . PROSTATE BIOPSY N/A 08/31/2016   Procedure: PROSTATE  BIOPSY TRANSRECTAL ULTRASONIC PROSTATE (TUBP);  Surgeon: Ardis Hughs, MD;  Location: [facility];  Service: Urology;  Laterality: N/A;  . SPACE OAR INSTILLATION  N/A 11/15/2016   Procedure: SPACE OAR INSTILLATION;  Surgeon: Ardis Hughs, MD;  Location: [facility];  Service: Urology;  Laterality: N/A;  . TRANSTHORACIC ECHOCARDIOGRAM  04/27/2016   severe focal basal LVH, ef 60-65%,  grade 2 diastoilc dysfunction/  mild AR, MR, and TR/  atrial septum lipomatous hypertrophy/  PASP 19mmHg  . UMBILICAL HERNIA REPAIR      Social History:  Ambulatory  Steve Brown,     reports that he has never smoked. He has never used smokeless tobacco. He reports that he does not drink alcohol and does not use drugs.     Family History:   Family History  Problem Relation Age of Onset  . Aneurysm Mother   . Cancer Father        unknown either pancreatic or prostate  . Stroke Brother   . Dementia Sister   . Heart attack Neg Hx     Allergies: Allergies  Allergen Reactions  . Other Other (See Comments)    Per patient- cardiac cath dye-  "woke up during procedure hysterical."  . Phenergan [Promethazine] Other (See Comments)    Mood changes   . Tramadol      Prior to Admission medications   Medication Sig Start Date End Date Taking? Authorizing Provider  acetaminophen (TYLENOL) 500 MG tablet Take 1,000 mg by mouth every 6 (six) hours as needed (pain).    Yes [provider]  Ascorbic Acid (VITAMIN C PO) Take 1 tablet by mouth daily.   Yes [provider]  cholecalciferol (VITAMIN D) 1000 UNITS tablet Take 1,000 Units by mouth daily.   Yes [provider]  diclofenac sodium (VOLTAREN) 1 % GEL Apply 1 application topically See admin instructions. Apply topically once daily , may use an additional time as needed for pain 10/16/16  Yes [provider]  Echinacea 450 MG CAPS Take 450 mg by mouth daily.    Yes [provider]  fluticasone (FLONASE) 50 MCG/ACT nasal spray Place 1 spray into both nostrils 2 (two) times daily as needed for allergies.  09/26/16  Yes [provider]  furosemide (LASIX)  20 MG tablet Take 1-2 tablets daily as needed for swelling. Patient taking differently: Take 20 mg by mouth daily.  08/08/19  Yes Jettie Booze, MD  insulin lispro (HUMALOG KWIKPEN) 100 UNIT/ML KiwkPen Inject 6-10 Units into the skin 3 (three) times daily with meals.   Yes [provider]  losartan (COZAAR) 100 MG tablet Take 100 mg by mouth daily. 03/29/18  Yes [provider]  Multiple Vitamin (MULTIVITAMIN WITH MINERALS) TABS tablet Take 1 tablet by mouth daily.   Yes [provider]  nitroGLYCERIN (NITROSTAT) 0.4 MG SL tablet Place 1 tablet (0.4 mg total) under the tongue every 5 (five) minutes as needed for chest pain. 12/07/15  Yes Jettie Booze, MD  pregabalin (LYRICA) 300 MG capsule Take 1 capsule (300 mg total) by mouth 2 (two) times daily. 07/26/12  Yes Dohmeier, Asencion Partridge, MD  rivaroxaban (XARELTO) 20 MG TABS tablet Take 20 mg by mouth daily.    Yes [provider]  TRESIBA FLEXTOUCH 100 UNIT/ML SOPN FlexTouch Pen Inject 26 Units into the skin every morning.  05/11/18  Yes [provider]  valACYclovir (VALTREX) 500 MG tablet Take 500 mg by mouth daily.   Yes [provider]  zinc gluconate 50 MG tablet Take 50 mg by mouth daily.   Yes [provider]  gabapentin (NEURONTIN) 100 MG capsule Take 1 capsule (100 mg total) by mouth 3 (three) times daily. Patient not taking: Reported on 08/11/2019 06/23/19   Virgel Manifold, MD  potassium chloride (KLOR-CON) 10 MEQ tablet Take 1 tablet (10 mEq total) by mouth as needed when taking Lasix Patient not taking: Reported on 08/11/2019 08/08/19   Jettie Booze, MD   Physical Exam: Vitals with BMI 08/11/2019 08/11/2019 08/11/2019  Height - - -  Weight - - -  BMI - - -  Systolic 144 315 400  Diastolic 86 89 69  Pulse 62 69 58     1. General:  in No  Acute distress    Chronically ill -appearing 2. Psychological: Alert and Oriented 3. Head/ENT:   Moist  Mucous Membranes                           Head Non traumatic, neck supple                            Poor Dentition 4. SKIN: normal  Skin turgor,  Skin clean Dry and intact no rash 5. Heart: Regular rate and rhythm no  Murmur, no Rub or gallop 6. Lungs:   no wheezes or crackles   7. Abdomen: Soft, non-tender,  distended bowel sounds present 8. Lower extremities: no clubbing, cyanosis, no edema 9. Neurologically Grossly intact, moving all 4 extremities equally   10. MSK: Normal range of motion   All other LABS:     Recent Labs  Lab 08/11/19 1730  WBC 5.4  NEUTROABS 2.7  HGB 13.3  HCT 39.6  MCV 94.3  PLT 199     Recent Labs  Lab 08/06/19 1243 08/11/19 1730  NA 140 141  K 4.1 3.9  CL 104 103  CO2 27 29  GLUCOSE 127* 61*  BUN 20 18  CREATININE 1.46* 1.48*  CALCIUM 9.4 9.5     Recent Labs  Lab 08/11/19 1730  AST 21  ALT 19  ALKPHOS 65  BILITOT 0.9  PROT 6.7  ALBUMIN 3.6       Cultures:    Component Value Date/Time   SDES  12/16/2018 1431    URINE, CLEAN CATCH Performed at [facility], [address] [place], [place] [address]    [facility]  12/16/2018 1431    NONE Performed at [facility], [address] [place], [place] [address]    CULT (A) 12/16/2018 1431    >=100,000 COLONIES/mL AEROCOCCUS URINAE Standardized susceptibility testing for this organism is not available. Performed at [facility], [place] [address], [place],  [address]    REPTSTATUS 12/18/2018 FINAL 12/16/2018 1431     Radiological Exams on Admission: DG Chest 2 View  Result Date: 08/11/2019 CLINICAL DATA:  79 year old male with chest pain. EXAM: CHEST - 2 VIEW COMPARISON:  Chest radiograph dated 01/12/2019. FINDINGS: Patchy area of airspace density at the left lung base which may represent atelectasis or infiltrate. Clinical correlation and follow-up to resolution recommended. There is no pleural effusion or pneumothorax. Mild cardiomegaly. Atherosclerotic calcification  of the aorta. Degenerative changes of the spine. No acute osseous pathology. Lower cervical ACDF. Cholecystectomy clips. IMPRESSION: Left lung base density concerning for pneumonia. Clinical correlation and follow-up to resolution recommended. Electronically Signed   By: Anner Crete M.D.   On: 08/11/2019 17:16   CT Angio Chest PE W and/or Wo Contrast  Result Date: 08/11/2019 CLINICAL DATA:  Positive D-dimer, PE suspected, history of prior pulmonary embolism EXAM: CT ANGIOGRAPHY CHEST WITH CONTRAST TECHNIQUE: Multidetector CT imaging of the chest was performed using the standard protocol during bolus administration of intravenous contrast. Multiplanar CT image reconstructions and MIPs were obtained to evaluate the vascular anatomy. CONTRAST:  10mL OMNIPAQUE IOHEXOL 350 MG/ML SOLN COMPARISON:  CT PA 06/30/2012 FINDINGS: Cardiovascular: Satisfactory opacification of the pulmonary arteries to the segmental level. No evidence of acute or residual chronic pulmonary artery emboli. Mild dilatation of the pulmonary trunk. Cardiac size within normal limits. No pericardial effusion. Three-vessel coronary artery disease is noted. Atherosclerotic plaque within the normal caliber aorta. Suboptimal opacification of the aorta for luminal evaluation. No acute periaortic abnormality. Shared origin of the brachiocephalic and left common carotid artery with minimal plaque in the proximal great vessels. No major venous abnormalities. Mediastinum/Nodes: No mediastinal fluid or gas. Normal thyroid gland and thoracic inlet. No acute abnormality of the trachea. Small hiatal hernia without other acute abnormality of thoracic esophagus. No worrisome mediastinal, hilar or axillary adenopathy. Lungs/Pleura: Atelectatic changes are present throughout the lungs, likely accentuated by imaging during exhalation for the angiographic technique. There is some mild diffuse airways thickening however with scattered secretions. No consolidation,  features of edema, pneumothorax, or effusion. No suspicious pulmonary nodules or masses. Upper Abdomen: Prior cholecystectomy. Mild nonspecific bilateral perinephric stranding. Can be seen with advanced age/senescent change. Musculoskeletal: Multilevel degenerative changes are present in the imaged portions of the spine. Multilevel flowing anterior osteophytosis, compatible with features of diffuse idiopathic skeletal hyperostosis (DISH). Additional degenerative changes in the bilateral shoulders. No acute or worrisome osseous lesions. No  suspicious chest wall lesions. Review of the MIP images confirms the above findings. IMPRESSION: 1. No evidence of acute or chronic residual pulmonary artery emboli. 2. Mild dilatation of the pulmonary trunk, can be seen with pulmonary arterial hypertension. 3. Atelectatic changes throughout the lungs, likely accentuated by imaging during exhalation for the angiographic technique. 4. Mild diffuse airways thickening may reflect bronchitic change either acute or chronic. 5. Coronary artery calcifications are present. Please note that the presence of coronary artery calcium documents the presence of coronary artery disease, the severity of this disease and any potential stenosis cannot be assessed on this non-gated CT examination. 6. Aortic Atherosclerosis (ICD10-I70.0). Electronically Signed   By: Lovena Le M.D.   On: 08/11/2019 19:44    Chart has been reviewed   Assessment/Plan  79 y.o. male with medical history significant of  CAD, LAD stent, CKD, PE (currently on Xarelto), hyperlipidemia, hypertension    Admitted for Chest pain evaluation    Present on Admission: . Chest pain -- H=  1  ,E=0  ,A=  2  , R  2  , T 0  ,  for the  Total of 5  therefore will admit for observation and further evaluation ( Risk of MACE: Scores 0-3  of 0.9-1.7%.,  4-6: 12-16.6% , Scores ?7: 50-65% ) - CT A negative for significant PE  - troponin unremarkable  -No acute ischemic changes  on ECG   -  Other explanation for chest pain could be pulmonary   - monitor on telemetry, cycle cardiac enzymes, obtain serial ECG and  ECHO in AM.   - Daily aspirin -  Further risk stratify with lipid panel, hgA1C, obtain TSH.  Make sure patient is on Aspirin.  We will notify cardiology regarding patient's admission. Further management depends on pending  workup  . Apnea, sleep - CPAP    . Benign essential HTN - restart HOme meds when stable  . CAD in native artery - start   Aspirin  not on statin, does not tolerate BB apprecite cardiology evaluation NPO PNM for possible Cath  . CKD (chronic kidney disease), stage II -chronic stable avoid nephrotoxic medications   . Diabetes mellitus type 2 with complications ([place]) -initially hypoglycemic hold home medications currently blood sugar above 200.  Gently restart sliding scale if able.  Order diabetic coordinator consult.  If continues to be difficult to manage may need to discuss with primary endocrinologist in a.m.   . History of pulmonary embolism: June 2014,  Takes Xarelto -transition from Xarelto to Lovenox as per cardiology recommendation   History of TIA.-Aspirin daily.   Left leg edema - on Xarelto but given sig leg difference will check [place]  abd distention - obtain KUB, KUB showed ileus, abd distention may explain worsening shortness of breath. Keep NPO, order bowel regimen given constipation If no improvement may need Gi consult     Other plan as per orders.  DVT prophylaxis:   Lovenox       Code Status:    Code Status: Prior FULL CODE  as per patient      Family Communication:   Family not at  Bedside    Disposition Plan:     To home once workup is complete and patient is stable   Following barriers for discharge:  Pain controlled with PO medications                                                          Will need consultants to evaluate patient prior to  discharge                        Consults called:  Cardiology is aware  Admission status:  ED Disposition    ED Disposition Condition [place]: [place] [100100]  Level of Care: Progressive [102]  Admit to Progressive based on following criteria: CARDIOVASCULAR & THORACIC of moderate stability with acute coronary syndrome symptoms/low risk myocardial infarction/hypertensive urgency/arrhythmias/heart failure potentially compromising stability and stable post cardiovascular intervention patients.  Covid Evaluation: Asymptomatic Screening Protocol (No Symptoms)  Diagnosis: Chest pain [212248]  Admitting Physician: Toy Baker [[id number]]  Attending Physician: Toy Baker [[id number]]        Obs      Level of care       SDU tele indefinitely please discontinue once patient no longer qualifies COVID-19 Labs Ongoing CHest pain   Lab Results  Component Value Date   [place] NEGATIVE 08/11/2019     Precautions: admitted as     Covid Negative  result   PPE: Used by the provider:   N95  eye Goggles,  Gloves   Britani Beattie 08/12/2019, 12:19 AM    [facility]     after 2 AM please page floor coverage PA If 7AM-7PM, please contact the day team taking care of the patient using [email or web address]   Patient was evaluated in the context of the global COVID-19 pandemic, which necessitated consideration that the patient might be at risk for infection with the SARS-CoV-2 virus that causes COVID-19. Institutional protocols and algorithms that pertain to the evaluation of patients at risk for COVID-19 are in a state of rapid change based on information released by regulatory bodies including the CDC and federal and state organizations. These policies and algorithms were followed during the patient's care.

## 2019-08-12 ENCOUNTER — Other Ambulatory Visit: Payer: Self-pay | Admitting: *Deleted

## 2019-08-12 ENCOUNTER — Observation Stay (HOSPITAL_BASED_OUTPATIENT_CLINIC_OR_DEPARTMENT_OTHER): Payer: Medicare Other

## 2019-08-12 ENCOUNTER — Other Ambulatory Visit: Payer: Self-pay

## 2019-08-12 ENCOUNTER — Observation Stay (HOSPITAL_COMMUNITY): Payer: Medicare Other

## 2019-08-12 DIAGNOSIS — E1122 Type 2 diabetes mellitus with diabetic chronic kidney disease: Secondary | ICD-10-CM | POA: Diagnosis present

## 2019-08-12 DIAGNOSIS — K567 Ileus, unspecified: Secondary | ICD-10-CM | POA: Diagnosis present

## 2019-08-12 DIAGNOSIS — I130 Hypertensive heart and chronic kidney disease with heart failure and stage 1 through stage 4 chronic kidney disease, or unspecified chronic kidney disease: Secondary | ICD-10-CM | POA: Diagnosis present

## 2019-08-12 DIAGNOSIS — R609 Edema, unspecified: Secondary | ICD-10-CM

## 2019-08-12 DIAGNOSIS — N182 Chronic kidney disease, stage 2 (mild): Secondary | ICD-10-CM | POA: Diagnosis not present

## 2019-08-12 DIAGNOSIS — R0600 Dyspnea, unspecified: Secondary | ICD-10-CM | POA: Diagnosis not present

## 2019-08-12 DIAGNOSIS — R079 Chest pain, unspecified: Secondary | ICD-10-CM

## 2019-08-12 DIAGNOSIS — Z7401 Bed confinement status: Secondary | ICD-10-CM | POA: Diagnosis not present

## 2019-08-12 DIAGNOSIS — Z8673 Personal history of transient ischemic attack (TIA), and cerebral infarction without residual deficits: Secondary | ICD-10-CM | POA: Diagnosis not present

## 2019-08-12 DIAGNOSIS — I25110 Atherosclerotic heart disease of native coronary artery with unstable angina pectoris: Secondary | ICD-10-CM | POA: Diagnosis present

## 2019-08-12 DIAGNOSIS — I499 Cardiac arrhythmia, unspecified: Secondary | ICD-10-CM | POA: Diagnosis not present

## 2019-08-12 DIAGNOSIS — E7800 Pure hypercholesterolemia, unspecified: Secondary | ICD-10-CM | POA: Diagnosis present

## 2019-08-12 DIAGNOSIS — I5033 Acute on chronic diastolic (congestive) heart failure: Secondary | ICD-10-CM | POA: Diagnosis present

## 2019-08-12 DIAGNOSIS — G4733 Obstructive sleep apnea (adult) (pediatric): Secondary | ICD-10-CM | POA: Diagnosis present

## 2019-08-12 DIAGNOSIS — R140 Abdominal distension (gaseous): Secondary | ICD-10-CM | POA: Diagnosis not present

## 2019-08-12 DIAGNOSIS — M5386 Other specified dorsopathies, lumbar region: Secondary | ICD-10-CM | POA: Diagnosis not present

## 2019-08-12 DIAGNOSIS — Z961 Presence of intraocular lens: Secondary | ICD-10-CM | POA: Diagnosis present

## 2019-08-12 DIAGNOSIS — Z8679 Personal history of other diseases of the circulatory system: Secondary | ICD-10-CM | POA: Diagnosis not present

## 2019-08-12 DIAGNOSIS — I2510 Atherosclerotic heart disease of native coronary artery without angina pectoris: Secondary | ICD-10-CM | POA: Diagnosis not present

## 2019-08-12 DIAGNOSIS — M5136 Other intervertebral disc degeneration, lumbar region: Secondary | ICD-10-CM | POA: Diagnosis present

## 2019-08-12 DIAGNOSIS — I69351 Hemiplegia and hemiparesis following cerebral infarction affecting right dominant side: Secondary | ICD-10-CM | POA: Diagnosis not present

## 2019-08-12 DIAGNOSIS — G629 Polyneuropathy, unspecified: Secondary | ICD-10-CM | POA: Diagnosis present

## 2019-08-12 DIAGNOSIS — E118 Type 2 diabetes mellitus with unspecified complications: Secondary | ICD-10-CM | POA: Diagnosis not present

## 2019-08-12 DIAGNOSIS — M5030 Other cervical disc degeneration, unspecified cervical region: Secondary | ICD-10-CM | POA: Diagnosis present

## 2019-08-12 DIAGNOSIS — M48061 Spinal stenosis, lumbar region without neurogenic claudication: Secondary | ICD-10-CM | POA: Diagnosis not present

## 2019-08-12 DIAGNOSIS — R0789 Other chest pain: Secondary | ICD-10-CM | POA: Diagnosis not present

## 2019-08-12 DIAGNOSIS — Z20822 Contact with and (suspected) exposure to covid-19: Secondary | ICD-10-CM | POA: Diagnosis present

## 2019-08-12 DIAGNOSIS — E11649 Type 2 diabetes mellitus with hypoglycemia without coma: Secondary | ICD-10-CM | POA: Diagnosis present

## 2019-08-12 DIAGNOSIS — K219 Gastro-esophageal reflux disease without esophagitis: Secondary | ICD-10-CM | POA: Diagnosis present

## 2019-08-12 DIAGNOSIS — E785 Hyperlipidemia, unspecified: Secondary | ICD-10-CM | POA: Diagnosis present

## 2019-08-12 DIAGNOSIS — I10 Essential (primary) hypertension: Secondary | ICD-10-CM | POA: Diagnosis not present

## 2019-08-12 DIAGNOSIS — N1832 Chronic kidney disease, stage 3b: Secondary | ICD-10-CM | POA: Diagnosis present

## 2019-08-12 DIAGNOSIS — Z86711 Personal history of pulmonary embolism: Secondary | ICD-10-CM | POA: Diagnosis not present

## 2019-08-12 DIAGNOSIS — M5126 Other intervertebral disc displacement, lumbar region: Secondary | ICD-10-CM | POA: Diagnosis not present

## 2019-08-12 DIAGNOSIS — I700 Atherosclerosis of aorta: Secondary | ICD-10-CM | POA: Diagnosis present

## 2019-08-12 DIAGNOSIS — N400 Enlarged prostate without lower urinary tract symptoms: Secondary | ICD-10-CM | POA: Diagnosis present

## 2019-08-12 DIAGNOSIS — I69354 Hemiplegia and hemiparesis following cerebral infarction affecting left non-dominant side: Secondary | ICD-10-CM | POA: Diagnosis not present

## 2019-08-12 DIAGNOSIS — F329 Major depressive disorder, single episode, unspecified: Secondary | ICD-10-CM | POA: Diagnosis present

## 2019-08-12 DIAGNOSIS — M2550 Pain in unspecified joint: Secondary | ICD-10-CM | POA: Diagnosis not present

## 2019-08-12 DIAGNOSIS — M47819 Spondylosis without myelopathy or radiculopathy, site unspecified: Secondary | ICD-10-CM | POA: Diagnosis present

## 2019-08-12 DIAGNOSIS — M25552 Pain in left hip: Secondary | ICD-10-CM | POA: Diagnosis not present

## 2019-08-12 DIAGNOSIS — R0602 Shortness of breath: Secondary | ICD-10-CM

## 2019-08-12 DIAGNOSIS — I429 Cardiomyopathy, unspecified: Secondary | ICD-10-CM | POA: Diagnosis not present

## 2019-08-12 DIAGNOSIS — Z9989 Dependence on other enabling machines and devices: Secondary | ICD-10-CM | POA: Diagnosis not present

## 2019-08-12 LAB — CBC WITH DIFFERENTIAL/PLATELET
Abs Immature Granulocytes: 0.01 10*3/uL (ref 0.00–0.07)
Basophils Absolute: 0 10*3/uL (ref 0.0–0.1)
Basophils Relative: 1 %
Eosinophils Absolute: 0 10*3/uL (ref 0.0–0.5)
Eosinophils Relative: 1 %
HCT: 39.6 % (ref 39.0–52.0)
Hemoglobin: 13.5 g/dL (ref 13.0–17.0)
Immature Granulocytes: 0 %
Lymphocytes Relative: 32 %
Lymphs Abs: 1.6 10*3/uL (ref 0.7–4.0)
MCH: 32.1 pg (ref 26.0–34.0)
MCHC: 34.1 g/dL (ref 30.0–36.0)
MCV: 94.3 fL (ref 80.0–100.0)
Monocytes Absolute: 0.6 10*3/uL (ref 0.1–1.0)
Monocytes Relative: 13 %
Neutro Abs: 2.7 10*3/uL (ref 1.7–7.7)
Neutrophils Relative %: 53 %
Platelets: 220 10*3/uL (ref 150–400)
RBC: 4.2 MIL/uL — ABNORMAL LOW (ref 4.22–5.81)
RDW: 12.6 % (ref 11.5–15.5)
WBC: 5 10*3/uL (ref 4.0–10.5)
nRBC: 0 % (ref 0.0–0.2)

## 2019-08-12 LAB — GLUCOSE, CAPILLARY
Glucose-Capillary: 174 mg/dL — ABNORMAL HIGH (ref 70–99)
Glucose-Capillary: 216 mg/dL — ABNORMAL HIGH (ref 70–99)
Glucose-Capillary: 267 mg/dL — ABNORMAL HIGH (ref 70–99)
Glucose-Capillary: 311 mg/dL — ABNORMAL HIGH (ref 70–99)

## 2019-08-12 LAB — COMPREHENSIVE METABOLIC PANEL
ALT: 19 U/L (ref 0–44)
AST: 19 U/L (ref 15–41)
Albumin: 3.4 g/dL — ABNORMAL LOW (ref 3.5–5.0)
Alkaline Phosphatase: 62 U/L (ref 38–126)
Anion gap: 10 (ref 5–15)
BUN: 16 mg/dL (ref 8–23)
CO2: 30 mmol/L (ref 22–32)
Calcium: 9.1 mg/dL (ref 8.9–10.3)
Chloride: 99 mmol/L (ref 98–111)
Creatinine, Ser: 1.39 mg/dL — ABNORMAL HIGH (ref 0.61–1.24)
GFR calc Af Amer: 56 mL/min — ABNORMAL LOW (ref >60)
GFR calc non Af Amer: 49 mL/min — ABNORMAL LOW (ref >60)
Glucose, Bld: 176 mg/dL — ABNORMAL HIGH (ref 70–99)
Potassium: 3.8 mmol/L (ref 3.5–5.1)
Sodium: 139 mmol/L (ref 135–145)
Total Bilirubin: 1.1 mg/dL (ref 0.3–1.2)
Total Protein: 6.5 g/dL (ref 6.5–8.1)

## 2019-08-12 LAB — HEMOGLOBIN A1C
Hgb A1c MFr Bld: 7.5 % — ABNORMAL HIGH (ref 4.8–5.6)
Mean Plasma Glucose: 168.55 mg/dL

## 2019-08-12 LAB — RAPID URINE DRUG SCREEN, HOSP PERFORMED
Amphetamines: NOT DETECTED
Barbiturates: NOT DETECTED
Benzodiazepines: NOT DETECTED
Cocaine: NOT DETECTED
Opiates: NOT DETECTED
Tetrahydrocannabinol: NOT DETECTED

## 2019-08-12 LAB — MAGNESIUM: Magnesium: 1.7 mg/dL (ref 1.7–2.4)

## 2019-08-12 LAB — LIPID PANEL
Cholesterol: 203 mg/dL — ABNORMAL HIGH (ref 0–200)
HDL: 44 mg/dL (ref >40)
LDL Cholesterol: 138 mg/dL — ABNORMAL HIGH (ref 0–99)
Total CHOL/HDL Ratio: 4.6 RATIO
Triglycerides: 107 mg/dL (ref <150)
VLDL: 21 mg/dL (ref 0–40)

## 2019-08-12 LAB — MRSA PCR SCREENING: MRSA by PCR: NEGATIVE

## 2019-08-12 LAB — SODIUM, URINE, RANDOM: Sodium, Ur: 118 mmol/L

## 2019-08-12 LAB — CBG MONITORING, ED
Glucose-Capillary: 151 mg/dL — ABNORMAL HIGH (ref 70–99)
Glucose-Capillary: 151 mg/dL — ABNORMAL HIGH (ref 70–99)
Glucose-Capillary: 158 mg/dL — ABNORMAL HIGH (ref 70–99)
Glucose-Capillary: 159 mg/dL — ABNORMAL HIGH (ref 70–99)
Glucose-Capillary: 159 mg/dL — ABNORMAL HIGH (ref 70–99)
Glucose-Capillary: 186 mg/dL — ABNORMAL HIGH (ref 70–99)
Glucose-Capillary: 207 mg/dL — ABNORMAL HIGH (ref 70–99)
Glucose-Capillary: 207 mg/dL — ABNORMAL HIGH (ref 70–99)

## 2019-08-12 LAB — PHOSPHORUS: Phosphorus: 4 mg/dL (ref 2.5–4.6)

## 2019-08-12 LAB — TSH: TSH: 1.536 u[IU]/mL (ref 0.350–4.500)

## 2019-08-12 LAB — TROPONIN I (HIGH SENSITIVITY)
Troponin I (High Sensitivity): 11 ng/L (ref <18)
Troponin I (High Sensitivity): 12 ng/L (ref <18)

## 2019-08-12 MED ORDER — HYDROCODONE-ACETAMINOPHEN 5-325 MG PO TABS
1.0000 | ORAL_TABLET | ORAL | Status: DC | PRN
  Administered 2019-08-12: 1 via ORAL
  Administered 2019-08-13 – 2019-08-15 (×3): 2 via ORAL
  Filled 2019-08-12: qty 2
  Filled 2019-08-12: qty 1
  Filled 2019-08-12 (×2): qty 2

## 2019-08-12 MED ORDER — PNEUMOCOCCAL VAC POLYVALENT 25 MCG/0.5ML IJ INJ: 0.5000 mL | INJECTION | INTRAMUSCULAR | Status: DC | PRN

## 2019-08-12 MED ORDER — PREGABALIN 75 MG PO CAPS
300.0000 mg | ORAL_CAPSULE | Freq: Two times a day (BID) | ORAL | Status: DC
  Administered 2019-08-12 – 2019-08-17 (×11): 300 mg via ORAL
  Filled 2019-08-12: qty 4
  Filled 2019-08-12: qty 12
  Filled 2019-08-12 (×2): qty 4
  Filled 2019-08-12: qty 12
  Filled 2019-08-12: qty 4
  Filled 2019-08-12: qty 3
  Filled 2019-08-12 (×5): qty 4

## 2019-08-12 MED ORDER — ACETAMINOPHEN 325 MG PO TABS
650.0000 mg | ORAL_TABLET | Freq: Four times a day (QID) | ORAL | Status: DC | PRN
  Administered 2019-08-13 – 2019-08-16 (×3): 650 mg via ORAL
  Filled 2019-08-12 (×4): qty 2

## 2019-08-12 MED ORDER — FUROSEMIDE 10 MG/ML IJ SOLN
40.0000 mg | Freq: Two times a day (BID) | INTRAMUSCULAR | Status: DC
  Administered 2019-08-12: 40 mg via INTRAVENOUS
  Filled 2019-08-12: qty 4

## 2019-08-12 MED ORDER — SODIUM CHLORIDE 0.9% FLUSH
3.0000 mL | Freq: Two times a day (BID) | INTRAVENOUS | Status: DC
  Administered 2019-08-12 – 2019-08-16 (×10): 3 mL via INTRAVENOUS

## 2019-08-12 MED ORDER — BISACODYL 10 MG RE SUPP: 10.0000 mg | Freq: Once | RECTAL | Status: DC

## 2019-08-12 MED ORDER — ASPIRIN 81 MG PO CHEW
81.0000 mg | CHEWABLE_TABLET | Freq: Every day | ORAL | Status: DC
  Administered 2019-08-12 – 2019-08-14 (×3): 81 mg via ORAL
  Filled 2019-08-12 (×3): qty 1

## 2019-08-12 MED ORDER — ONDANSETRON HCL 4 MG/2ML IJ SOLN: 4.0000 mg | Freq: Four times a day (QID) | INTRAMUSCULAR | Status: DC | PRN

## 2019-08-12 MED ORDER — SENNA 8.6 MG PO TABS
1.0000 | ORAL_TABLET | Freq: Every day | ORAL | Status: DC
  Administered 2019-08-12 – 2019-08-15 (×3): 8.6 mg via ORAL
  Filled 2019-08-12 (×4): qty 1

## 2019-08-12 MED ORDER — DOCUSATE SODIUM 100 MG PO CAPS
100.0000 mg | ORAL_CAPSULE | Freq: Two times a day (BID) | ORAL | Status: DC
  Administered 2019-08-12 – 2019-08-17 (×10): 100 mg via ORAL
  Filled 2019-08-12 (×10): qty 1

## 2019-08-12 MED ORDER — INSULIN ASPART 100 UNIT/ML ~~LOC~~ SOLN
0.0000 [IU] | SUBCUTANEOUS | Status: DC
  Administered 2019-08-12: 1 [IU] via SUBCUTANEOUS
  Administered 2019-08-12: 4 [IU] via SUBCUTANEOUS
  Administered 2019-08-12: 3 [IU] via SUBCUTANEOUS
  Administered 2019-08-12 – 2019-08-13 (×2): 1 [IU] via SUBCUTANEOUS
  Administered 2019-08-13: 2 [IU] via SUBCUTANEOUS
  Administered 2019-08-13: 4 [IU] via SUBCUTANEOUS
  Administered 2019-08-14: 2 [IU] via SUBCUTANEOUS
  Administered 2019-08-14: 4 [IU] via SUBCUTANEOUS
  Administered 2019-08-14: 2 [IU] via SUBCUTANEOUS
  Administered 2019-08-14: 3 [IU] via SUBCUTANEOUS

## 2019-08-12 MED ORDER — POLYETHYLENE GLYCOL 3350 17 G PO PACK
17.0000 g | PACK | Freq: Every day | ORAL | Status: DC
  Administered 2019-08-12: 17 g via ORAL
  Filled 2019-08-12 (×2): qty 1

## 2019-08-12 MED ORDER — FUROSEMIDE 10 MG/ML IJ SOLN
60.0000 mg | Freq: Two times a day (BID) | INTRAMUSCULAR | Status: DC
  Administered 2019-08-12 – 2019-08-13 (×2): 60 mg via INTRAVENOUS
  Filled 2019-08-12 (×2): qty 6

## 2019-08-12 MED ORDER — ONDANSETRON HCL 4 MG PO TABS: 4.0000 mg | ORAL_TABLET | Freq: Four times a day (QID) | ORAL | Status: DC | PRN

## 2019-08-12 MED ORDER — ACETAMINOPHEN 650 MG RE SUPP: 650.0000 mg | Freq: Four times a day (QID) | RECTAL | Status: DC | PRN

## 2019-08-12 NOTE — Progress Notes (Addendum)
PROGRESS NOTE    Steve Brown  JAS:[id number] DOB: Dec 20, 1940 DOA: 08/11/2019 PCP: Lawerance Cruel, MD  Brief Narrative:   37 black male CAD stent 2010, 2014-last echo 08/08/2019 no CHF IDDM TY 2 Stroke 03/29/2019 OSA on CPAP Pulmonary embolism continuously taking Xarelto CKD 3B baseline CR 1.5 Vaccinated versus Covid Admit from home 8/2-usually active male 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[employer] walks around the track 1 to 2 miles stop doing this?  1 to 2 months prior-ambulating over the past week has been worse for him he has felt lower extremity swelling abdominal swelling and left side has been preferentially more swollen than right Came to ED for evaluation-received ASA 325+ SL nitro-relieved pain/heaviness in chest-Rx Lasix IV 60 Initial troponin not concerning BNP probably falsely low 2/2 BMI 33 Patient evaluated by cardiology this a.m.  Assessment & Plan:   Active Problems:   Diabetes mellitus type 2 with complications (HCC)   HTN (hypertension)   CAD in native artery   History of recurrent TIAs   History of pulmonary embolism: June 2014,  Takes Xarelto   OSA on CPAP   CKD (chronic kidney disease), stage II   Benign essential HTN   Apnea, sleep   Ischemic chest pain (HCC)   Nonspecific chest pain   Ileus (HCC)   Chest pain   1. Anginal equivalent versus decompensated CHf? a. Diuresis per cardiology-no BB 2/2 acute component? b. Continue nitroglycerin aspirin etc. as per them c. ?  Cath?  Stress test 2. Pulmonary embolism 06/2012 a. CT chest done on admission does not confirm pulmonary embolism and refutes questionable pneumonia diagnosis on chest x-ray b. Dopplers lower extremity 8/3 - for DVT c. Lovenox >Xarelto at this time secondary to possibility for cath? 3. Recurrent TIA a. ASA 81 mg currently-outpatient follow-up 4. DM TY 2 managed by Dr. Dwyane Dee in the outpatient setting 5. Hypoglycemia of 48 on admission 6. Complicated by neuropathy in addition a. Blood sugars improved  150s to 170s b.  continue gabapentin c. long-acting on hold 7. HTN a. Well-controlled at this time does not seem to be on any meds b. We will resume low-dose Cozaar in the next several days depending on creatinine 8. CKD 3 a. Outpatient follow-up monitor trends phosphorus in a.m. 9. Sleep apnea 10. OSA on CPAP a. We will allow him to use his own device if he brings it in b. At bedtime settings per respiratory 11. BMI 33  DVT prophylaxis: Lovenox Code Status: Full Family Communication: None  Disposition: Inpatient pending diuresis?  Need for invasive versus other work-up per cards  Status is: Inpatient  Remains inpatient appropriate because:Ongoing diagnostic testing needed not appropriate for outpatient work up, Unsafe d/c plan and IV treatments appropriate due to intensity of illness or inability to take PO   Dispo: The patient is from: Home              Anticipated d/c is to: Home              Anticipated d/c date is: 2 days              Patient currently is not medically stable to d/c.       Consultants:   Cardiology  Procedures: None yet  Antimicrobials: None   Subjective: Awake coherent pleasant taking him off the oxygen he does not desat No chest pain Relates left shoulder pain which is chronic Also relates has been having shortness of breath as per HPI above Otherwise  in good spirits although a little sleepy  Objective: Vitals:   08/12/19 1059 08/12/19 1129 08/12/19 1306 08/12/19 1330  BP: 139/83  99/65 119/74  Pulse: 74  78 73  Resp: 20  19 16   Temp:   98.2 F (36.8 C) 98.4 F (36.9 C)  TempSrc:   Oral Oral  SpO2:  100% 97% 97%  Weight:    90.8 kg  Height:    5\' 5"  (1.651 m)    Intake/Output Summary (Last 24 hours) at 08/12/2019 1505 Last data filed at 08/12/2019 1310 Gross per 24 hour  Intake --  Output 1475 ml  Net -1475 ml   Filed Weights   08/11/19 1631 08/12/19 1330  Weight: 94.8 kg 90.8 kg    Examination:  General exam: EOMI NCAT  Mallampati 4 no JVD appreciable at 30 degrees Respiratory system: Clear no rales rhonchi added sound Cardiovascular system: S1-S2 sinus rhythm cannot appreciate murmur Gastrointestinal system: Obese no organomegaly limited exam. Central nervous system: Intact no focal deficit Extremities: Right lower extremity less distended than left Skin: Trace edema Psychiatry: Euthymic congruent and pleasant  Data Reviewed: I have personally reviewed following labs and imaging studies BUN/creatinine 18/1.4-->16/1.3 LDL 138 total cholesterol 203 HDL 44 Hemoglobin 13.5  Radiology Studies: DG Chest 2 View  Result Date: 08/11/2019 CLINICAL DATA:  79 year old male with chest pain. EXAM: CHEST - 2 VIEW COMPARISON:  Chest radiograph dated 01/12/2019. FINDINGS: Patchy area of airspace density at the left lung base which may represent atelectasis or infiltrate. Clinical correlation and follow-up to resolution recommended. There is no pleural effusion or pneumothorax. Mild cardiomegaly. Atherosclerotic calcification of the aorta. Degenerative changes of the spine. No acute osseous pathology. Lower cervical ACDF. Cholecystectomy clips. IMPRESSION: Left lung base density concerning for pneumonia. Clinical correlation and follow-up to resolution recommended. Electronically Signed   By: Anner Crete M.D.   On: 08/11/2019 17:16   DG Abd 1 View  Result Date: 08/12/2019 CLINICAL DATA:  Abdomen distension EXAM: ABDOMEN - 1 VIEW COMPARISON:  None. FINDINGS: Clips in the right upper quadrant. Mild diffuse increased small and large bowel gas without distention. Moderate stool in the colon. Probable phleboliths in the pelvis. IMPRESSION: Mild diffuse increased small and large bowel gas without distention, possible ileus. Moderate stool in the colon. Electronically Signed   By: Donavan Foil M.D.   On: 08/12/2019 00:34   CT Angio Chest PE W and/or Wo Contrast  Result Date: 08/11/2019 CLINICAL DATA:  Positive D-dimer, PE  suspected, history of prior pulmonary embolism EXAM: CT ANGIOGRAPHY CHEST WITH CONTRAST TECHNIQUE: Multidetector CT imaging of the chest was performed using the standard protocol during bolus administration of intravenous contrast. Multiplanar CT image reconstructions and MIPs were obtained to evaluate the vascular anatomy. CONTRAST:  33mL OMNIPAQUE IOHEXOL 350 MG/ML SOLN COMPARISON:  CT PA 06/30/2012 FINDINGS: Cardiovascular: Satisfactory opacification of the pulmonary arteries to the segmental level. No evidence of acute or residual chronic pulmonary artery emboli. Mild dilatation of the pulmonary trunk. Cardiac size within normal limits. No pericardial effusion. Three-vessel coronary artery disease is noted. Atherosclerotic plaque within the normal caliber aorta. Suboptimal opacification of the aorta for luminal evaluation. No acute periaortic abnormality. Shared origin of the brachiocephalic and left common carotid artery with minimal plaque in the proximal great vessels. No major venous abnormalities. Mediastinum/Nodes: No mediastinal fluid or gas. Normal thyroid gland and thoracic inlet. No acute abnormality of the trachea. Small hiatal hernia without other acute abnormality of thoracic esophagus. No worrisome mediastinal, hilar or  axillary adenopathy. Lungs/Pleura: Atelectatic changes are present throughout the lungs, likely accentuated by imaging during exhalation for the angiographic technique. There is some mild diffuse airways thickening however with scattered secretions. No consolidation, features of edema, pneumothorax, or effusion. No suspicious pulmonary nodules or masses. Upper Abdomen: Prior cholecystectomy. Mild nonspecific bilateral perinephric stranding. Can be seen with advanced age/senescent change. Musculoskeletal: Multilevel degenerative changes are present in the imaged portions of the spine. Multilevel flowing anterior osteophytosis, compatible with features of diffuse idiopathic skeletal  hyperostosis (DISH). Additional degenerative changes in the bilateral shoulders. No acute or worrisome osseous lesions. No suspicious chest wall lesions. Review of the MIP images confirms the above findings. IMPRESSION: 1. No evidence of acute or chronic residual pulmonary artery emboli. 2. Mild dilatation of the pulmonary trunk, can be seen with pulmonary arterial hypertension. 3. Atelectatic changes throughout the lungs, likely accentuated by imaging during exhalation for the angiographic technique. 4. Mild diffuse airways thickening may reflect bronchitic change either acute or chronic. 5. Coronary artery calcifications are present. Please note that the presence of coronary artery calcium documents the presence of coronary artery disease, the severity of this disease and any potential stenosis cannot be assessed on this non-gated CT examination. 6. Aortic Atherosclerosis (ICD10-I70.0). Electronically Signed   By: Lovena Le M.D.   On: 08/11/2019 19:44   VAS [place] LOWER EXTREMITY VENOUS (DVT)  Result Date: 08/12/2019  Lower Venous DVTStudy Indications: Edema, and SOB.  Comparison Study: Prior study 07-01-2012 LEV bilateral negative for DVT Performing Technologist: Darlin Coco  Examination Guidelines: A complete evaluation includes B-mode imaging, spectral Doppler, color Doppler, and power Doppler as needed of all accessible portions of each vessel. Bilateral testing is considered an integral part of a complete examination. Limited examinations for reoccurring indications may be performed as noted. The reflux portion of the exam is performed with the patient in reverse Trendelenburg.  +-----+---------------+---------+-----------+----------+--------------+ RIGHTCompressibilityPhasicitySpontaneityPropertiesThrombus Aging +-----+---------------+---------+-----------+----------+--------------+ CFV  Full           Yes      Yes                                  +-----+---------------+---------+-----------+----------+--------------+   +---------+---------------+---------+-----------+----------+--------------+ LEFT     CompressibilityPhasicitySpontaneityPropertiesThrombus Aging +---------+---------------+---------+-----------+----------+--------------+ CFV      Full           Yes      Yes                                 +---------+---------------+---------+-----------+----------+--------------+ SFJ      Partial                                                     +---------+---------------+---------+-----------+----------+--------------+ FV Prox  Full                                                        +---------+---------------+---------+-----------+----------+--------------+ FV Mid   Full                                                        +---------+---------------+---------+-----------+----------+--------------+  FV DistalFull                                                        +---------+---------------+---------+-----------+----------+--------------+ PFV      Full                                                        +---------+---------------+---------+-----------+----------+--------------+ POP      Full           Yes      Yes                                 +---------+---------------+---------+-----------+----------+--------------+ PTV      Full                                                        +---------+---------------+---------+-----------+----------+--------------+ PERO     Full                                                        +---------+---------------+---------+-----------+----------+--------------+     Summary: RIGHT: - No evidence of common femoral vein obstruction.  LEFT: - There is no evidence of deep vein thrombosis in the lower extremity.  - No cystic structure found in the popliteal fossa.  *See table(s) above for measurements and observations. Electronically signed  by Deitra Mayo MD on 08/12/2019 at 11:03:33 AM.    Final      Scheduled Meds: . aspirin  81 mg Oral Daily  . bisacodyl  10 mg Rectal Once  . docusate sodium  100 mg Oral BID  . enoxaparin (LOVENOX) injection  1 mg/kg Subcutaneous Q12H  . furosemide  60 mg Intravenous BID  . insulin aspart  0-6 Units Subcutaneous Q4H  . polyethylene glycol  17 g Oral Daily  . pregabalin  300 mg Oral BID  . senna  1 tablet Oral Daily  . sodium chloride flush  3 mL Intravenous Q12H  45 Continuous Infusions:   LOS: 0 days    Time spent: [place], MD [facility] To contact the attending provider between 7A-7P or the covering provider during after hours 7P-7A, please log into the web site [email or web address] and access using universal [place] password for that web site. If you do not have the password, please call the hospital operator.  08/12/2019, 3:05 PM

## 2019-08-12 NOTE — Patient Outreach (Signed)
[facility]) Care Management  08/12/2019  Steve Brown [birth year] [id number]  [place] will close case due to patient being admitted to the hospital. [facility] CM will continue to follow patient and his care needs.   Plan: RN Health Coach will close case.  Emelia Loron RN, BSN [place] [phone] [email or web address]

## 2019-08-12 NOTE — Progress Notes (Signed)
Progress Note  Patient Name: Steve Brown Date of Encounter: 08/12/2019  [facility] Cardiologist: Larae Grooms, MD   Subjective   Patinet is breathing better  Denies CP at present  Urinated a lot     Inpatient Medications    Scheduled Meds: . aspirin  81 mg Oral Daily  . bisacodyl  10 mg Rectal Once  . docusate sodium  100 mg Oral BID  . enoxaparin (LOVENOX) injection  1 mg/kg Subcutaneous Q12H  . insulin aspart  0-6 Units Subcutaneous Q4H  . polyethylene glycol  17 g Oral Daily  . pregabalin  300 mg Oral BID  . sodium chloride flush  3 mL Intravenous Q12H   Continuous Infusions:  PRN Meds: acetaminophen **OR** acetaminophen, HYDROcodone-acetaminophen, nitroGLYCERIN, ondansetron **OR** ondansetron (ZOFRAN) IV   Vital Signs    Vitals:   08/12/19 0700 08/12/19 0701 08/12/19 0702 08/12/19 0711  BP: 127/64   124/64  Pulse:  (!) 53 (!) 51 (!) 51  Resp: 16 14 19 18   Temp:      TempSrc:      SpO2:  100% 99% 99%  Weight:      Height:        Intake/Output Summary (Last 24 hours) at 08/12/2019 0721 Last data filed at 08/11/2019 2135 Gross per 24 hour  Intake --  Output 850 ml  Net -850 ml   Last 3 Weights 08/11/2019 06/23/2019 03/20/2019  Weight (lbs) 209 lb 205 lb 205 lb  Weight (kg) 94.802 kg 92.987 kg 92.987 kg      Telemetry    SR - Personally Reviewed  ECG    No new  - Personally Reviewed  Physical Exam   GEN: No acute distress.   Neck: Neck is full   Difficlut to assess JVP   Cardiac: RRR, no murmurs, rubs, or gallops.  Respiratory: Clear to auscultation bilaterally. GI: Soft, nontender, non-distended  MS: 1+edema to thigh. Neuro:  Nonfocal  Psych: Normal affect   Labs    High Sensitivity Troponin:   Recent Labs  Lab 08/11/19 1730 08/11/19 2135 08/12/19 0015 08/12/19 0231  TROPONINIHS 12 12 11 12       Chemistry Recent Labs  Lab 08/06/19 1243 08/11/19 1730 08/12/19 0424  NA 140 141 139  K 4.1 3.9 3.8  CL 104 103 99  CO2  27 29 30   GLUCOSE 127* 61* 176*  BUN 20 18 16   CREATININE 1.46* 1.48* 1.39*  CALCIUM 9.4 9.5 9.1  PROT  --  6.7 6.5  ALBUMIN  --  3.6 3.4*  AST  --  21 19  ALT  --  19 19  ALKPHOS  --  65 62  BILITOT  --  0.9 1.1  GFRNONAA 46* 45* 49*  GFRAA 53* 52* 56*  ANIONGAP  --  9 10     Hematology Recent Labs  Lab 08/11/19 1730 08/12/19 0424  WBC 5.4 5.0  RBC 4.20* 4.20*  HGB 13.3 13.5  HCT 39.6 39.6  MCV 94.3 94.3  MCH 31.7 32.1  MCHC 33.6 34.1  RDW 12.6 12.6  PLT 199 220    BNP Recent Labs  Lab 08/06/19 1243 08/11/19 1730  BNP  --  70.3  PROBNP 93  --      DDimer  Recent Labs  Lab 08/11/19 1742  DDIMER 1.24*     Radiology    DG Chest 2 View  Result Date: 08/11/2019 CLINICAL DATA:  80 year old male with chest pain. EXAM: CHEST - 2 VIEW  COMPARISON:  Chest radiograph dated 01/12/2019. FINDINGS: Patchy area of airspace density at the left lung base which may represent atelectasis or infiltrate. Clinical correlation and follow-up to resolution recommended. There is no pleural effusion or pneumothorax. Mild cardiomegaly. Atherosclerotic calcification of the aorta. Degenerative changes of the spine. No acute osseous pathology. Lower cervical ACDF. Cholecystectomy clips. IMPRESSION: Left lung base density concerning for pneumonia. Clinical correlation and follow-up to resolution recommended. Electronically Signed   By: Anner Crete M.D.   On: 08/11/2019 17:16   DG Abd 1 View  Result Date: 08/12/2019 CLINICAL DATA:  Abdomen distension EXAM: ABDOMEN - 1 VIEW COMPARISON:  None. FINDINGS: Clips in the right upper quadrant. Mild diffuse increased small and large bowel gas without distention. Moderate stool in the colon. Probable phleboliths in the pelvis. IMPRESSION: Mild diffuse increased small and large bowel gas without distention, possible ileus. Moderate stool in the colon. Electronically Signed   By: Donavan Foil M.D.   On: 08/12/2019 00:34   CT Angio Chest PE W and/or  Wo Contrast  Result Date: 08/11/2019 CLINICAL DATA:  Positive D-dimer, PE suspected, history of prior pulmonary embolism EXAM: CT ANGIOGRAPHY CHEST WITH CONTRAST TECHNIQUE: Multidetector CT imaging of the chest was performed using the standard protocol during bolus administration of intravenous contrast. Multiplanar CT image reconstructions and MIPs were obtained to evaluate the vascular anatomy. CONTRAST:  15mL OMNIPAQUE IOHEXOL 350 MG/ML SOLN COMPARISON:  CT PA 06/30/2012 FINDINGS: Cardiovascular: Satisfactory opacification of the pulmonary arteries to the segmental level. No evidence of acute or residual chronic pulmonary artery emboli. Mild dilatation of the pulmonary trunk. Cardiac size within normal limits. No pericardial effusion. Three-vessel coronary artery disease is noted. Atherosclerotic plaque within the normal caliber aorta. Suboptimal opacification of the aorta for luminal evaluation. No acute periaortic abnormality. Shared origin of the brachiocephalic and left common carotid artery with minimal plaque in the proximal great vessels. No major venous abnormalities. Mediastinum/Nodes: No mediastinal fluid or gas. Normal thyroid gland and thoracic inlet. No acute abnormality of the trachea. Small hiatal hernia without other acute abnormality of thoracic esophagus. No worrisome mediastinal, hilar or axillary adenopathy. Lungs/Pleura: Atelectatic changes are present throughout the lungs, likely accentuated by imaging during exhalation for the angiographic technique. There is some mild diffuse airways thickening however with scattered secretions. No consolidation, features of edema, pneumothorax, or effusion. No suspicious pulmonary nodules or masses. Upper Abdomen: Prior cholecystectomy. Mild nonspecific bilateral perinephric stranding. Can be seen with advanced age/senescent change. Musculoskeletal: Multilevel degenerative changes are present in the imaged portions of the spine. Multilevel flowing  anterior osteophytosis, compatible with features of diffuse idiopathic skeletal hyperostosis (DISH). Additional degenerative changes in the bilateral shoulders. No acute or worrisome osseous lesions. No suspicious chest wall lesions. Review of the MIP images confirms the above findings. IMPRESSION: 1. No evidence of acute or chronic residual pulmonary artery emboli. 2. Mild dilatation of the pulmonary trunk, can be seen with pulmonary arterial hypertension. 3. Atelectatic changes throughout the lungs, likely accentuated by imaging during exhalation for the angiographic technique. 4. Mild diffuse airways thickening may reflect bronchitic change either acute or chronic. 5. Coronary artery calcifications are present. Please note that the presence of coronary artery calcium documents the presence of coronary artery disease, the severity of this disease and any potential stenosis cannot be assessed on this non-gated CT examination. 6. Aortic Atherosclerosis (ICD10-I70.0). Electronically Signed   By: Lovena Le M.D.   On: 08/11/2019 19:44    Cardiac Studies  Relevant CV Studies: LCP  2019:   "Acute Mrg lesion is 25% stenosed.  Mid RCA lesion is 25% stenosed.  2nd Mrg lesion is 40% stenosed.  Mid Cx lesion is 40% stenosed.  Previously placed Mid LAD stent (unknown type) is widely patent.  Prox LAD-1 lesion is 25% stenosed.  Prox LAD-2 lesion is 25% stenosed.  The left ventricular systolic function is normal.  LV end diastolic pressure is normal.  The left ventricular ejection fraction is 55-65% by visual estimate.  There is no aortic valve stenosis.  Recent Echo 7.30.21 1. Normal GLS -17.5. Left ventricular ejection fraction, by estimation,  is 60 to 65%. The left ventricle has normal function. The left ventricle  has no regional wall motion abnormalities. Left ventricular diastolic  parameters were normal.  2. Right ventricular systolic function is normal. The right ventricular  size  is normal. There is mildly elevated pulmonary artery systolic  pressure.  3. The mitral valve is normal in structure. Trivial mitral valve  regurgitation. No evidence of mitral stenosis.  4. The aortic valve is tricuspid. Aortic valve regurgitation is trivial.  Mild aortic valve sclerosis is present, with no evidence of aortic valve  stenosis.  5. Mild pulmonic stenosis.  6. The inferior vena cava is normal in size with greater than 50% respiratory variability, suggesting right atrial pressure of 3 mmHg.    LE dopplers:  NEg forDVT on L  Patient Profile     Steve Brown is a 79 y.o. male with a hx of CAD s/p Prox LAD Stent in 2010, last LCP 2014; PE on Xarelto, CKD IIIB Creatine ~ 1.48; IDDM (Type II), Prior stroke 3/21 without residual deficit, OSA  who is being seen today for the evaluation of chest pain and shortness of breath at the request of ED Team [facility]). Assessment & Plan    1 Chest pain    Trop 12,11,12   NO elevation    Symptoms are improving some with lasix   For now I would recomm continue diureiss with lasix and follow   I would not proceed with invasive Rx until volume improved and then if symptomatic   2  Dypsnea  Improving with diuresis   Continue   3  CKD  Stage IIIb  Cr is better today than yesterday  Curr 1.39   4  Hx CVA  5  Hx PE   On long term anticoagulation  No PE on CT    LLE without DVT    6  OSA    For questions or updates, please contact [facility] Please consult [email or web address] for contact info under        Signed, [name], MD  08/12/2019, 7:21 AM

## 2019-08-12 NOTE — Plan of Care (Signed)

## 2019-08-12 NOTE — Progress Notes (Addendum)
Inpatient Diabetes Program Recommendations  AACE/ADA: New Consensus Statement on Inpatient Glycemic Control (2015)  Target Ranges:  Prepandial:   less than 140 mg/dL      Peak postprandial:   less than 180 mg/dL (1-2 hours)      Critically ill patients:  140 - 180 mg/dL   Results for DERAY, DAWES (MRN [id number]) as of 08/12/2019 12:29  Ref. Range 08/11/2019 18:41 08/11/2019 22:29 08/12/2019 00:08 08/12/2019 00:08 08/12/2019 02:29 08/12/2019 02:29 08/12/2019 04:06 08/12/2019 04:06 08/12/2019 09:58 08/12/2019 12:15  Glucose-Capillary Latest Ref Range: 70 - 99 mg/dL 48 (L) 235 (H) 207 (H) 207 (H) 151 (H) 151 (H) 159 (H)  1 unit NOVOLOG  159 (H) 158 (H) 186 (H)    Results for JOHN, WILLIAMSEN (MRN [id number]) as of 08/12/2019 07:21  Ref. Range 08/12/2019 02:31  Hemoglobin A1C Latest Ref Range: 4.8 - 5.6 % 7.5 (H)   Admit with: CP  History: DM, CKD  Home DM Meds: Humalog 6-10 units TID per SSI       Tresiba 26 units Daily  Current Orders: Novolog 0-6 units Q4 hours    Endocrinologist: Dr. Chalmers Cater with [facility] seen 04/03/2019   MD- Note patient initially Hypoglycemic on arrival to the ED--CBG was 48 at 6:41pm last night.  Likely from effects of home insulins and stress of current illness, lack of PO intake.  CBG stable so far today.  Will likely need some basal insulin (Lantus) back at some point.       --Will follow patient during hospitalization--  Wyn Quaker RN, MSN, CDE Diabetes Coordinator Inpatient Glycemic Control Team Team Pager: [phone] (8a-5p)

## 2019-08-12 NOTE — ED Notes (Signed)
Assisted pt to the restroom 

## 2019-08-12 NOTE — ED Notes (Signed)
Report attempted 

## 2019-08-13 ENCOUNTER — Inpatient Hospital Stay (HOSPITAL_COMMUNITY): Payer: Medicare Other

## 2019-08-13 DIAGNOSIS — I429 Cardiomyopathy, unspecified: Secondary | ICD-10-CM

## 2019-08-13 LAB — CBC WITH DIFFERENTIAL/PLATELET
Abs Immature Granulocytes: 0.02 10*3/uL (ref 0.00–0.07)
Basophils Absolute: 0 10*3/uL (ref 0.0–0.1)
Basophils Relative: 1 %
Eosinophils Absolute: 0.1 10*3/uL (ref 0.0–0.5)
Eosinophils Relative: 1 %
HCT: 38 % — ABNORMAL LOW (ref 39.0–52.0)
Hemoglobin: 12.9 g/dL — ABNORMAL LOW (ref 13.0–17.0)
Immature Granulocytes: 0 %
Lymphocytes Relative: 38 %
Lymphs Abs: 1.9 10*3/uL (ref 0.7–4.0)
MCH: 31.6 pg (ref 26.0–34.0)
MCHC: 33.9 g/dL (ref 30.0–36.0)
MCV: 93.1 fL (ref 80.0–100.0)
Monocytes Absolute: 0.7 10*3/uL (ref 0.1–1.0)
Monocytes Relative: 14 %
Neutro Abs: 2.3 10*3/uL (ref 1.7–7.7)
Neutrophils Relative %: 46 %
Platelets: 222 10*3/uL (ref 150–400)
RBC: 4.08 MIL/uL — ABNORMAL LOW (ref 4.22–5.81)
RDW: 12.7 % (ref 11.5–15.5)
WBC: 5 10*3/uL (ref 4.0–10.5)
nRBC: 0 % (ref 0.0–0.2)

## 2019-08-13 LAB — GLUCOSE, CAPILLARY
Glucose-Capillary: 114 mg/dL — ABNORMAL HIGH (ref 70–99)
Glucose-Capillary: 135 mg/dL — ABNORMAL HIGH (ref 70–99)
Glucose-Capillary: 143 mg/dL — ABNORMAL HIGH (ref 70–99)
Glucose-Capillary: 186 mg/dL — ABNORMAL HIGH (ref 70–99)
Glucose-Capillary: 334 mg/dL — ABNORMAL HIGH (ref 70–99)
Glucose-Capillary: 285 mg/dL — ABNORMAL HIGH (ref 70–99)

## 2019-08-13 LAB — RENAL FUNCTION PANEL
Albumin: 3.5 g/dL (ref 3.5–5.0)
Anion gap: 11 (ref 5–15)
BUN: 30 mg/dL — ABNORMAL HIGH (ref 8–23)
CO2: 30 mmol/L (ref 22–32)
Calcium: 9.1 mg/dL (ref 8.9–10.3)
Chloride: 96 mmol/L — ABNORMAL LOW (ref 98–111)
Creatinine, Ser: 1.95 mg/dL — ABNORMAL HIGH (ref 0.61–1.24)
GFR calc Af Amer: 37 mL/min — ABNORMAL LOW (ref >60)
GFR calc non Af Amer: 32 mL/min — ABNORMAL LOW (ref >60)
Glucose, Bld: 196 mg/dL — ABNORMAL HIGH (ref 70–99)
Phosphorus: 4.8 mg/dL — ABNORMAL HIGH (ref 2.5–4.6)
Potassium: 4.4 mmol/L (ref 3.5–5.1)
Sodium: 137 mmol/L (ref 135–145)

## 2019-08-13 LAB — NM MYOCAR MULTI W/SPECT W/WALL MOTION / EF
Estimated workload: 1 METS
Exercise duration (min): 0 min
Exercise duration (sec): 0 s
MPHR: 143 {beats}/min
Peak HR: 81 {beats}/min
Percent HR: 56 %
Rest HR: 59 {beats}/min

## 2019-08-13 MED ORDER — REGADENOSON 0.4 MG/5ML IV SOLN: 0.4000 mg | Freq: Once | INTRAVENOUS | Status: AC

## 2019-08-13 MED ORDER — TECHNETIUM TC 99M TETROFOSMIN IV KIT
10.0000 | PACK | Freq: Once | INTRAVENOUS | Status: AC | PRN
  Administered 2019-08-13: 10 via INTRAVENOUS

## 2019-08-13 MED ORDER — REGADENOSON 0.4 MG/5ML IV SOLN
INTRAVENOUS | Status: AC
  Administered 2019-08-13: 0.4 mg via INTRAVENOUS
  Filled 2019-08-13: qty 5

## 2019-08-13 MED ORDER — TECHNETIUM TC 99M TETROFOSMIN IV KIT
31.3000 | PACK | Freq: Once | INTRAVENOUS | Status: AC | PRN
  Administered 2019-08-13: 31.3 via INTRAVENOUS

## 2019-08-13 NOTE — Progress Notes (Signed)
PROGRESS NOTE    Steve Brown  JKD:[id number] DOB: 02-04-1940 DOA: 08/11/2019 PCP: Lawerance Cruel, MD    Brief Narrative:  Steve Brown is a 79 year old male with past medical history CAD status post PCI/DES 2010, insulin-dependent diabetes mellitus, history of CVA, OSA on CPAP, history of PE on Xarelto, CKD stage IIIb who presented to the ED with lower extremity swelling, abdominal swelling and chest pain.  In the ED, patient received aspirin 3 and 25 mg and sublingual nitroglycerin with relief of his chest pain. He was given Lasix 60 mg IV. Cardiology was consulted and EDP requested hospitalist admission for further evaluation and treatment.   Assessment & Plan:   Active Problems:   Diabetes mellitus type 2 with complications (HCC)   HTN (hypertension)   CAD in native artery   History of recurrent TIAs   History of pulmonary embolism: June 2014,  Takes Xarelto   OSA on CPAP   CKD (chronic kidney disease), stage II   Benign essential HTN   Apnea, sleep   Ischemic chest pain (HCC)   Nonspecific chest pain   Ileus (HCC)   Chest pain   Chest pain Presenting to ED with 1 week history of worsening chest pain in the setting of known CAD with prior stenting LAD 2010. --Troponin 12>12>12>11>12; flat --Currently chest pain-free, not on beta-blocker secondary to intolerance given history of bradycardia --Continue Lovenox, restart home Xarelto possibly tomorrow --Continue aspirin --Stress test today per cardiology  Acute on chronic diastolic congestive heart failure TTE 08/08/2019 with LVEF and no regional wall motion abnormalities. CT chest negative for PE or pulmonary edema. BNP 93. Was started on aggressive IV diuresis with furosemide 60 mg IV every 12 hours, now on hold given bump in creatinine. --net negative 1.3L since admission --Continue strict I's and O's and daily weights --Close monitoring of creatinine --Hold furosemide, cardiology may restart tomorrow at a  lower dose  CKD stage IIIb Baseline and 1.2-1.4. --Cr 1.48>1.39>1.95 --Lasix 60 mg IV every 12 hours, currently on hold due to bump in creatinine --Continue to monitor renal function closely daily  History of pulmonary embolism, 2014 On Xarelto outpatient, currently being held for possible need of LHC. CT angiogram chest with no pulmonary embolism. Left lower extremity Doppler ultrasound negative for DVT. --Continue Lovenox treatment dose every 12 hours  Insulin-dependent diabetes mellitus On Tresiba 26u [place] daily, and Humalog 6-10u  TIDAC ISS at home. 7.5 on 08/12/2019. --Diabetic educator following, appreciate assistance --Currently n.p.o., continue very sensitive insulin sliding scale for further coverage --CBGs every 4 hours  Left leg pain Patient with history of chronic low back pain on oxycodone 20 mg every 4 hours as needed outpatient.  Complains of left leg pain with hip flexion that radiates down the posterior thigh.  Concern for possible sciatica. --CT L-spine: Pending --Avoid NSAIDs in the setting of CKD --Continue home oxycodone --PT evaluation   DVT prophylaxis: Lovenox Code Status: Full code Family Communication: No family present at bedside this morning  Disposition Plan:  Status is: Inpatient  Remains inpatient appropriate because:Ongoing diagnostic testing needed not appropriate for outpatient work up   Dispo: The patient is from: Home              Anticipated d/c is to: Home              Anticipated d/c date is: 2 days              Patient currently is not medically stable to  d/c.    Consultants:   Cardiology  Procedures:   none  Antimicrobials:   none   Subjective: Patient seen and examined at bedside, lying in bed. Complains of left leg pain with difficulty ambulating this morning. Feels it starts at his buttock region and radiates down his posterior leg past his knee. No other complaints or concerns at this time. States chest pain continues  to be resolved. No shortness of breath. Cardiology plans for stress test today. Denies headache, no fever/chills/night sweats, no nausea/vomit/diarrhea, no chest pain, no palpitations, no shortness of breath, no abdominal pain. No acute events overnight per nursing staff.  Objective: Vitals:   08/12/19 2357 08/13/19 0000 08/13/19 0406 08/13/19 0844  BP: 100/66  111/64 127/81  Pulse: 64  (!) 56 (!) 58  Resp: 20  17 12   Temp: (!) 96.2 F (35.7 C) 98.1 F (36.7 C) (!) 97.4 F (36.3 C) 97.6 F (36.4 C)  TempSrc: Axillary Oral Oral Oral  SpO2: 94%  92% 95%  Weight:      Height:        Intake/Output Summary (Last 24 hours) at 08/13/2019 1110 Last data filed at 08/13/2019 0947 Gross per 24 hour  Intake 973 ml  Output 1425 ml  Net -452 ml   Filed Weights   08/11/19 1631 08/12/19 1330  Weight: 94.8 kg 90.8 kg    Examination:  General exam: Appears calm and comfortable  Respiratory system: Clear to auscultation. Respiratory effort normal.  Oxygenating well on room air Cardiovascular system: S1 & S2 heard, RRR. No JVD, murmurs, rubs, gallops or clicks.  1+ pitting edema bilateral lower extremities Gastrointestinal system: Abdomen is nondistended, soft and nontender. No organomegaly or masses felt. Normal bowel sounds heard. Central nervous system: Alert and oriented. No focal neurological deficits. Extremities: Symmetric 5 x 5 power. Pain with left leg/hip flexion Skin: No rashes, lesions or ulcers Psychiatry: Judgement and insight appear normal. Mood & affect appropriate.     Data Reviewed: I have personally reviewed following labs and imaging studies  CBC: Recent Labs  Lab 08/11/19 1730 08/12/19 0424 08/13/19 0110  WBC 5.4 5.0 5.0  NEUTROABS 2.7 2.7 2.3  HGB 13.3 13.5 12.9*  HCT 39.6 39.6 38.0*  MCV 94.3 94.3 93.1  PLT 199 220 353   Basic Metabolic Panel: Recent Labs  Lab 08/06/19 1243 08/11/19 1730 08/12/19 0424 08/13/19 0110  NA 140 141 139 137  K 4.1 3.9 3.8  4.4  CL 104 103 99 96*  CO2 27 29 30 30   GLUCOSE 127* 61* 176* 196*  BUN 20 18 16  30*  CREATININE 1.46* 1.48* 1.39* 1.95*  CALCIUM 9.4 9.5 9.1 9.1  MG  --   --  1.7  --   PHOS  --   --  4.0 4.8*   GFR: Estimated Creatinine Clearance: 32.8 mL/min (A) (by C-G formula based on SCr of 1.95 mg/dL (H)). Liver Function Tests: Recent Labs  Lab 08/11/19 1730 08/12/19 0424 08/13/19 0110  AST 21 19  --   ALT 19 19  --   ALKPHOS 65 62  --   BILITOT 0.9 1.1  --   PROT 6.7 6.5  --   ALBUMIN 3.6 3.4* 3.5   No results for input(s): LIPASE, AMYLASE in the last 168 hours. No results for input(s): AMMONIA in the last 168 hours. Coagulation Profile: No results for input(s): INR, PROTIME in the last 168 hours. Cardiac Enzymes: No results for input(s): CKTOTAL, CKMB, CKMBINDEX, TROPONINI in the  last 168 hours. BNP (last 3 results) Recent Labs    08/06/19 1243  PROBNP 93   HbA1C: Recent Labs    08/12/19 0231  HGBA1C 7.5*   CBG: Recent Labs  Lab 08/12/19 1952 08/12/19 2358 08/13/19 0407 08/13/19 0824 08/13/19 0841  GLUCAP 311* 216* 114* 135* 143*   Lipid Profile: Recent Labs    08/12/19 0424  CHOL 203*  HDL 44  LDLCALC 138*  TRIG 107  CHOLHDL 4.6   Thyroid Function Tests: Recent Labs    08/12/19 0424  TSH 1.536   Anemia Panel: No results for input(s): VITAMINB12, FOLATE, FERRITIN, TIBC, IRON, RETICCTPCT in the last 72 hours. Sepsis Labs: No results for input(s): PROCALCITON, LATICACIDVEN in the last 168 hours.  Recent Results (from the past 240 hour(s))  SARS Coronavirus 2 by RT PCR (hospital order, performed in [facility] hospital lab) Nasopharyngeal Nasopharyngeal Swab     Status: None   Collection Time: 08/11/19  9:34 PM   Specimen: Nasopharyngeal Swab  Result Value Ref Range Status   SARS Coronavirus 2 NEGATIVE NEGATIVE Final    Comment: (NOTE) SARS-CoV-2 target nucleic acids are NOT DETECTED.  The SARS-CoV-2 RNA is generally detectable in upper and  lower respiratory specimens during the acute phase of infection. The lowest concentration of SARS-CoV-2 viral copies this assay can detect is 250 copies / mL. A negative result does not preclude SARS-CoV-2 infection and should not be used as the sole basis for treatment or other patient management decisions.  A negative result may occur with improper specimen collection / handling, submission of specimen other than nasopharyngeal swab, presence of viral mutation(s) within the areas targeted by this assay, and inadequate number of viral copies (<250 copies / mL). A negative result must be combined with clinical observations, patient history, and epidemiological information.  Fact Sheet for Patients:   StrictlyIdeas.no  Fact Sheet for Healthcare Providers: BankingDealers.co.za  This test is not yet approved or  cleared by the [place] FDA and has been authorized for detection and/or diagnosis of SARS-CoV-2 by FDA under an Emergency Use Authorization (EUA).  This EUA will remain in effect (meaning this test can be used) for the duration of the COVID-19 declaration under Section 564(b)(1) of the Act, 21 U.S.C. section 360bbb-3(b)(1), unless the authorization is terminated or revoked sooner.  Performed at [facility], [place] [address], [place], [place] [address]   MRSA PCR Screening     Status: None   Collection Time: 08/12/19  3:39 PM   Specimen: Nasal Mucosa; Nasopharyngeal  Result Value Ref Range Status   MRSA by PCR NEGATIVE NEGATIVE Final    Comment:        The GeneXpert MRSA Assay (FDA approved for NASAL specimens only), is one component of a comprehensive MRSA colonization surveillance program. It is not intended to diagnose MRSA infection nor to guide or monitor treatment for MRSA infections. Performed at [facility], [place] [address], [place], [place] [address]          Radiology Studies: DG Chest 2  View  Result Date: 08/11/2019 CLINICAL DATA:  79 year old male with chest pain. EXAM: CHEST - 2 VIEW COMPARISON:  Chest radiograph dated 01/12/2019. FINDINGS: Patchy area of airspace density at the left lung base which may represent atelectasis or infiltrate. Clinical correlation and follow-up to resolution recommended. There is no pleural effusion or pneumothorax. Mild cardiomegaly. Atherosclerotic calcification of the aorta. Degenerative changes of the spine. No acute osseous pathology. Lower cervical  ACDF. Cholecystectomy clips. IMPRESSION: Left lung base density concerning for pneumonia. Clinical correlation and follow-up to resolution recommended. Electronically Signed   By: Anner Crete M.D.   On: 08/11/2019 17:16   DG Abd 1 View  Result Date: 08/12/2019 CLINICAL DATA:  Abdomen distension EXAM: ABDOMEN - 1 VIEW COMPARISON:  None. FINDINGS: Clips in the right upper quadrant. Mild diffuse increased small and large bowel gas without distention. Moderate stool in the colon. Probable phleboliths in the pelvis. IMPRESSION: Mild diffuse increased small and large bowel gas without distention, possible ileus. Moderate stool in the colon. Electronically Signed   By: Donavan Foil M.D.   On: 08/12/2019 00:34   CT Angio Chest PE W and/or Wo Contrast  Result Date: 08/11/2019 CLINICAL DATA:  Positive D-dimer, PE suspected, history of prior pulmonary embolism EXAM: CT ANGIOGRAPHY CHEST WITH CONTRAST TECHNIQUE: Multidetector CT imaging of the chest was performed using the standard protocol during bolus administration of intravenous contrast. Multiplanar CT image reconstructions and MIPs were obtained to evaluate the vascular anatomy. CONTRAST:  66mL OMNIPAQUE IOHEXOL 350 MG/ML SOLN COMPARISON:  CT PA 06/30/2012 FINDINGS: Cardiovascular: Satisfactory opacification of the pulmonary arteries to the segmental level. No evidence of acute or residual chronic pulmonary artery emboli. Mild dilatation of the pulmonary  trunk. Cardiac size within normal limits. No pericardial effusion. Three-vessel coronary artery disease is noted. Atherosclerotic plaque within the normal caliber aorta. Suboptimal opacification of the aorta for luminal evaluation. No acute periaortic abnormality. Shared origin of the brachiocephalic and left common carotid artery with minimal plaque in the proximal great vessels. No major venous abnormalities. Mediastinum/Nodes: No mediastinal fluid or gas. Normal thyroid gland and thoracic inlet. No acute abnormality of the trachea. Small hiatal hernia without other acute abnormality of thoracic esophagus. No worrisome mediastinal, hilar or axillary adenopathy. Lungs/Pleura: Atelectatic changes are present throughout the lungs, likely accentuated by imaging during exhalation for the angiographic technique. There is some mild diffuse airways thickening however with scattered secretions. No consolidation, features of edema, pneumothorax, or effusion. No suspicious pulmonary nodules or masses. Upper Abdomen: Prior cholecystectomy. Mild nonspecific bilateral perinephric stranding. Can be seen with advanced age/senescent change. Musculoskeletal: Multilevel degenerative changes are present in the imaged portions of the spine. Multilevel flowing anterior osteophytosis, compatible with features of diffuse idiopathic skeletal hyperostosis (DISH). Additional degenerative changes in the bilateral shoulders. No acute or worrisome osseous lesions. No suspicious chest wall lesions. Review of the MIP images confirms the above findings. IMPRESSION: 1. No evidence of acute or chronic residual pulmonary artery emboli. 2. Mild dilatation of the pulmonary trunk, can be seen with pulmonary arterial hypertension. 3. Atelectatic changes throughout the lungs, likely accentuated by imaging during exhalation for the angiographic technique. 4. Mild diffuse airways thickening may reflect bronchitic change either acute or chronic. 5. Coronary  artery calcifications are present. Please note that the presence of coronary artery calcium documents the presence of coronary artery disease, the severity of this disease and any potential stenosis cannot be assessed on this non-gated CT examination. 6. Aortic Atherosclerosis (ICD10-I70.0). Electronically Signed   By: Lovena Le M.D.   On: 08/11/2019 19:44   VAS [place] LOWER EXTREMITY VENOUS (DVT)  Result Date: 08/12/2019  Lower Venous DVTStudy Indications: Edema, and SOB.  Comparison Study: Prior study 07-01-2012 LEV bilateral negative for DVT Performing Technologist: Darlin Coco  Examination Guidelines: A complete evaluation includes B-mode imaging, spectral Doppler, color Doppler, and power Doppler as needed of all accessible portions of each vessel. Bilateral testing is considered an  integral part of a complete examination. Limited examinations for reoccurring indications may be performed as noted. The reflux portion of the exam is performed with the patient in reverse Trendelenburg.  +-----+---------------+---------+-----------+----------+--------------+ RIGHTCompressibilityPhasicitySpontaneityPropertiesThrombus Aging +-----+---------------+---------+-----------+----------+--------------+ CFV  Full           Yes      Yes                                 +-----+---------------+---------+-----------+----------+--------------+   +---------+---------------+---------+-----------+----------+--------------+ LEFT     CompressibilityPhasicitySpontaneityPropertiesThrombus Aging +---------+---------------+---------+-----------+----------+--------------+ CFV      Full           Yes      Yes                                 +---------+---------------+---------+-----------+----------+--------------+ SFJ      Partial                                                     +---------+---------------+---------+-----------+----------+--------------+ FV Prox  Full                                                         +---------+---------------+---------+-----------+----------+--------------+ FV Mid   Full                                                        +---------+---------------+---------+-----------+----------+--------------+ FV DistalFull                                                        +---------+---------------+---------+-----------+----------+--------------+ PFV      Full                                                        +---------+---------------+---------+-----------+----------+--------------+ POP      Full           Yes      Yes                                 +---------+---------------+---------+-----------+----------+--------------+ PTV      Full                                                        +---------+---------------+---------+-----------+----------+--------------+ PERO     Full                                                        +---------+---------------+---------+-----------+----------+--------------+  Summary: RIGHT: - No evidence of common femoral vein obstruction.  LEFT: - There is no evidence of deep vein thrombosis in the lower extremity.  - No cystic structure found in the popliteal fossa.  *See table(s) above for measurements and observations. Electronically signed by Deitra Mayo MD on 08/12/2019 at 11:03:33 AM.    Final         Scheduled Meds: . aspirin  81 mg Oral Daily  . bisacodyl  10 mg Rectal Once  . docusate sodium  100 mg Oral BID  . enoxaparin (LOVENOX) injection  1 mg/kg Subcutaneous Q12H  . insulin aspart  0-6 Units Subcutaneous Q4H  . polyethylene glycol  17 g Oral Daily  . pregabalin  300 mg Oral BID  . senna  1 tablet Oral Daily  . sodium chloride flush  3 mL Intravenous Q12H   Continuous Infusions:   LOS: 1 day    Time spent: 38 minutes spent on chart review, discussion with nursing staff, consultants, updating family and interview/physical exam; more than 50% of  that time was spent in counseling and/or coordination of care.    Jatara Huettner J [place] DO [facility] Available via Epic secure chat 7am-7pm After these hours, please refer to coverage provider listed on [email or web address] 08/13/2019, 11:10 AM

## 2019-08-13 NOTE — Progress Notes (Signed)
Inpatient Diabetes Program Recommendations  AACE/ADA: New Consensus Statement on Inpatient Glycemic Control (2015)  Target Ranges:  Prepandial:   less than 140 mg/dL      Peak postprandial:   less than 180 mg/dL (1-2 hours)      Critically ill patients:  140 - 180 mg/dL   Lab Results  Component Value Date   GLUCAP 143 (H) 08/13/2019   HGBA1C 7.5 (H) 08/12/2019    Review of Glycemic Control Results for Steve Brown, Steve Brown (MRN [id number]) as of 08/13/2019 10:24  Ref. Range 08/12/2019 14:14 08/12/2019 16:34 08/12/2019 19:52 08/12/2019 23:58 08/13/2019 04:07 08/13/2019 08:24 08/13/2019 08:41  Glucose-Capillary Latest Ref Range: 70 - 99 mg/dL 174 (H) 267 (H) 311 (H) 216 (H) 114 (H) 135 (H) 143 (H)   Diabetes history: DM 2 Outpatient Diabetes medications:  Humalog 6-10 units tid with meals Tresiba 26 units q AM Current orders for Inpatient glycemic control:  Novolog 0-6 units q 4 hours  Inpatient Diabetes Program Recommendations:    Please consider changing Novolog correction to tid with meals and HS.  Also please consider adding Novolog 3 units tid with meals (hold if patient eats less than 50% or NPO).   Thanks,  Adah Perl, RN, BC-ADM Inpatient Diabetes Coordinator Pager [phone] (8a-5p)

## 2019-08-13 NOTE — Consult Note (Signed)
   [facility] CM Inpatient Consult   08/13/2019  Steve Brown 12/19/1940 [id number]   Prior to hospital admission, patient was active with [facility] for chronic disease management services.Patient has high unplanned readmission risk score, 34%.   Will continue to follow for progression and disposition.   Of note, [facility] Care Management services does not replace or interfere with any services that are needed or arranged by inpatient case management or social work.    Netta Cedars, MSN, [facility] Liaison Nurse Mobile Phone [phone]  Toll free office [phone]

## 2019-08-13 NOTE — Progress Notes (Signed)
Patient has home CPAP for use and will self place when needed.

## 2019-08-13 NOTE — Progress Notes (Addendum)
Progress Note  Patient Name: Steve Brown Date of Encounter: 08/13/2019  Primary Cardiologist: Larae Grooms, MD  Subjective   Having trouble with left leg pain this AM. [place] negative. No chest pain. Significant elevation in creatinine from yesterday to today 1.39>>1.95  Inpatient Medications    Scheduled Meds: . aspirin  81 mg Oral Daily  . bisacodyl  10 mg Rectal Once  . docusate sodium  100 mg Oral BID  . enoxaparin (LOVENOX) injection  1 mg/kg Subcutaneous Q12H  . furosemide  60 mg Intravenous BID  . insulin aspart  0-6 Units Subcutaneous Q4H  . polyethylene glycol  17 g Oral Daily  . pregabalin  300 mg Oral BID  . senna  1 tablet Oral Daily  . sodium chloride flush  3 mL Intravenous Q12H   Continuous Infusions:  PRN Meds: acetaminophen **OR** acetaminophen, HYDROcodone-acetaminophen, nitroGLYCERIN, ondansetron **OR** ondansetron (ZOFRAN) IV, pneumococcal 23 valent vaccine   Vital Signs    Vitals:   08/12/19 2357 08/13/19 0000 08/13/19 0406 08/13/19 0844  BP: 100/66  111/64 127/81  Pulse: 64  (!) 56 (!) 58  Resp: 20  17 12   Temp: (!) 96.2 F (35.7 C) 98.1 F (36.7 C) (!) 97.4 F (36.3 C) 97.6 F (36.4 C)  TempSrc: Axillary Oral Oral Oral  SpO2: 94%  92% 95%  Weight:      Height:        Intake/Output Summary (Last 24 hours) at 08/13/2019 0902 Last data filed at 08/12/2019 2120 Gross per 24 hour  Intake 963 ml  Output 1425 ml  Net -462 ml   Filed Weights   08/11/19 1631 08/12/19 1330  Weight: 94.8 kg 90.8 kg    Physical Exam   General: Well developed, well nourished, NAD Neck: Negative for carotid bruits. No JVD Lungs: Diminished bilaterally. No wheezes, rales, or rhonchi. Breathing is unlabored. Cardiovascular: RRR with S1 S2. No murmurs Abdomen: Soft, non-tender, distended. No obvious abdominal masses. Extremities: 1-2+ BLE  edema. Radial  pulses 2+ bilaterally Neuro: Alert and oriented. No focal deficits. No facial asymmetry. MAE  spontaneously. Psych: Responds to questions appropriately with normal affect.    Labs    Chemistry Recent Labs  Lab 08/11/19 1730 08/12/19 0424 08/13/19 0110  NA 141 139 137  K 3.9 3.8 4.4  CL 103 99 96*  CO2 29 30 30   GLUCOSE 61* 176* 196*  BUN 18 16 30*  CREATININE 1.48* 1.39* 1.95*  CALCIUM 9.5 9.1 9.1  PROT 6.7 6.5  --   ALBUMIN 3.6 3.4* 3.5  AST 21 19  --   ALT 19 19  --   ALKPHOS 65 62  --   BILITOT 0.9 1.1  --   GFRNONAA 45* 49* 32*  GFRAA 52* 56* 37*  ANIONGAP 9 10 11      Hematology Recent Labs  Lab 08/11/19 1730 08/12/19 0424 08/13/19 0110  WBC 5.4 5.0 5.0  RBC 4.20* 4.20* 4.08*  HGB 13.3 13.5 12.9*  HCT 39.6 39.6 38.0*  MCV 94.3 94.3 93.1  MCH 31.7 32.1 31.6  MCHC 33.6 34.1 33.9  RDW 12.6 12.6 12.7  PLT 199 220 222    Cardiac EnzymesNo results for input(s): TROPONINI in the last 168 hours. No results for input(s): TROPIPOC in the last 168 hours.   BNP Recent Labs  Lab 08/06/19 1243 08/11/19 1730  BNP  --  70.3  PROBNP 93  --      DDimer  Recent Labs  Lab 08/11/19  48  DDIMER 1.24*     Radiology    DG Chest 2 View  Result Date: 08/11/2019 CLINICAL DATA:  79 year old male with chest pain. EXAM: CHEST - 2 VIEW COMPARISON:  Chest radiograph dated 01/12/2019. FINDINGS: Patchy area of airspace density at the left lung base which may represent atelectasis or infiltrate. Clinical correlation and follow-up to resolution recommended. There is no pleural effusion or pneumothorax. Mild cardiomegaly. Atherosclerotic calcification of the aorta. Degenerative changes of the spine. No acute osseous pathology. Lower cervical ACDF. Cholecystectomy clips. IMPRESSION: Left lung base density concerning for pneumonia. Clinical correlation and follow-up to resolution recommended. Electronically Signed   By: Anner Crete M.D.   On: 08/11/2019 17:16   DG Abd 1 View  Result Date: 08/12/2019 CLINICAL DATA:  Abdomen distension EXAM: ABDOMEN - 1 VIEW  COMPARISON:  None. FINDINGS: Clips in the right upper quadrant. Mild diffuse increased small and large bowel gas without distention. Moderate stool in the colon. Probable phleboliths in the pelvis. IMPRESSION: Mild diffuse increased small and large bowel gas without distention, possible ileus. Moderate stool in the colon. Electronically Signed   By: Donavan Foil M.D.   On: 08/12/2019 00:34   CT Angio Chest PE W and/or Wo Contrast  Result Date: 08/11/2019 CLINICAL DATA:  Positive D-dimer, PE suspected, history of prior pulmonary embolism EXAM: CT ANGIOGRAPHY CHEST WITH CONTRAST TECHNIQUE: Multidetector CT imaging of the chest was performed using the standard protocol during bolus administration of intravenous contrast. Multiplanar CT image reconstructions and MIPs were obtained to evaluate the vascular anatomy. CONTRAST:  46mL OMNIPAQUE IOHEXOL 350 MG/ML SOLN COMPARISON:  CT PA 06/30/2012 FINDINGS: Cardiovascular: Satisfactory opacification of the pulmonary arteries to the segmental level. No evidence of acute or residual chronic pulmonary artery emboli. Mild dilatation of the pulmonary trunk. Cardiac size within normal limits. No pericardial effusion. Three-vessel coronary artery disease is noted. Atherosclerotic plaque within the normal caliber aorta. Suboptimal opacification of the aorta for luminal evaluation. No acute periaortic abnormality. Shared origin of the brachiocephalic and left common carotid artery with minimal plaque in the proximal great vessels. No major venous abnormalities. Mediastinum/Nodes: No mediastinal fluid or gas. Normal thyroid gland and thoracic inlet. No acute abnormality of the trachea. Small hiatal hernia without other acute abnormality of thoracic esophagus. No worrisome mediastinal, hilar or axillary adenopathy. Lungs/Pleura: Atelectatic changes are present throughout the lungs, likely accentuated by imaging during exhalation for the angiographic technique. There is some mild  diffuse airways thickening however with scattered secretions. No consolidation, features of edema, pneumothorax, or effusion. No suspicious pulmonary nodules or masses. Upper Abdomen: Prior cholecystectomy. Mild nonspecific bilateral perinephric stranding. Can be seen with advanced age/senescent change. Musculoskeletal: Multilevel degenerative changes are present in the imaged portions of the spine. Multilevel flowing anterior osteophytosis, compatible with features of diffuse idiopathic skeletal hyperostosis (DISH). Additional degenerative changes in the bilateral shoulders. No acute or worrisome osseous lesions. No suspicious chest wall lesions. Review of the MIP images confirms the above findings. IMPRESSION: 1. No evidence of acute or chronic residual pulmonary artery emboli. 2. Mild dilatation of the pulmonary trunk, can be seen with pulmonary arterial hypertension. 3. Atelectatic changes throughout the lungs, likely accentuated by imaging during exhalation for the angiographic technique. 4. Mild diffuse airways thickening may reflect bronchitic change either acute or chronic. 5. Coronary artery calcifications are present. Please note that the presence of coronary artery calcium documents the presence of coronary artery disease, the severity of this disease and any potential stenosis cannot be assessed  on this non-gated CT examination. 6. Aortic Atherosclerosis (ICD10-I70.0). Electronically Signed   By: Lovena Le M.D.   On: 08/11/2019 19:44   VAS [place] LOWER EXTREMITY VENOUS (DVT)  Result Date: 08/12/2019  Lower Venous DVTStudy Indications: Edema, and SOB.  Comparison Study: Prior study 07-01-2012 LEV bilateral negative for DVT Performing Technologist: Darlin Coco  Examination Guidelines: A complete evaluation includes B-mode imaging, spectral Doppler, color Doppler, and power Doppler as needed of all accessible portions of each vessel. Bilateral testing is considered an integral part of a complete  examination. Limited examinations for reoccurring indications may be performed as noted. The reflux portion of the exam is performed with the patient in reverse Trendelenburg.  +-----+---------------+---------+-----------+----------+--------------+ RIGHTCompressibilityPhasicitySpontaneityPropertiesThrombus Aging +-----+---------------+---------+-----------+----------+--------------+ CFV  Full           Yes      Yes                                 +-----+---------------+---------+-----------+----------+--------------+   +---------+---------------+---------+-----------+----------+--------------+ LEFT     CompressibilityPhasicitySpontaneityPropertiesThrombus Aging +---------+---------------+---------+-----------+----------+--------------+ CFV      Full           Yes      Yes                                 +---------+---------------+---------+-----------+----------+--------------+ SFJ      Partial                                                     +---------+---------------+---------+-----------+----------+--------------+ FV Prox  Full                                                        +---------+---------------+---------+-----------+----------+--------------+ FV Mid   Full                                                        +---------+---------------+---------+-----------+----------+--------------+ FV DistalFull                                                        +---------+---------------+---------+-----------+----------+--------------+ PFV      Full                                                        +---------+---------------+---------+-----------+----------+--------------+ POP      Full           Yes      Yes                                 +---------+---------------+---------+-----------+----------+--------------+  PTV      Full                                                         +---------+---------------+---------+-----------+----------+--------------+ PERO     Full                                                        +---------+---------------+---------+-----------+----------+--------------+     Summary: RIGHT: - No evidence of common femoral vein obstruction.  LEFT: - There is no evidence of deep vein thrombosis in the lower extremity.  - No cystic structure found in the popliteal fossa.  *See table(s) above for measurements and observations. Electronically signed by Deitra Mayo MD on 08/12/2019 at 11:03:33 AM.    Final    Telemetry    08/13/19 NSR- Personally Reviewed  ECG    No new tracing as of 08/13/19- Personally Reviewed  Cardiac Studies   LCP 2019:  "Acute Mrg lesion is 25% stenosed.  Mid RCA lesion is 25% stenosed.  2nd Mrg lesion is 40% stenosed.  Mid Cx lesion is 40% stenosed.  Previously placed Mid LAD stent (unknown type) is widely patent.  Prox LAD-1 lesion is 25% stenosed.  Prox LAD-2 lesion is 25% stenosed.  The left ventricular systolic function is normal.  LV end diastolic pressure is normal.  The left ventricular ejection fraction is 55-65% by visual estimate.  There is no aortic valve stenosis.  Recent Echo 1. Normal GLS -17.5. Left ventricular ejection fraction, by estimation,  is 60 to 65%. The left ventricle has normal function. The left ventricle  has no regional wall motion abnormalities. Left ventricular diastolic  parameters were normal.  2. Right ventricular systolic function is normal. The right ventricular  size is normal. There is mildly elevated pulmonary artery systolic  pressure.  3. The mitral valve is normal in structure. Trivial mitral valve  regurgitation. No evidence of mitral stenosis.  4. The aortic valve is tricuspid. Aortic valve regurgitation is trivial.  Mild aortic valve sclerosis is present, with no evidence of aortic valve  stenosis.  5. Mild pulmonic stenosis.  6. The  inferior vena cava is normal in size with greater than 50% respiratory variability, suggesting right atrial pressure of 3 mmHg.  Patient Profile     79 y.o. male with a hx of CAD s/p Prox LAD Stent in 2010, last LCP 2014; PE on Xarelto, CKD IIIB Creatine ~ 1.48; IDDM (Type II), Prior stroke 3/21 without residual deficit, OSAwho is being seen today for the evaluation of chest pain and shortness of breathat the request of ED Team [facility]).  Assessment & Plan    1. Chest Pain/Unstable angina: -Pt presented with a 1 week hx of worsening chest pain with known history of CAD status post proximal LAD stenting in 2010 with last LHC in 2014 with no change -HST, 12>12>12>11>12>> flat trend, not consistent with ACS -Not currently on beta-blocker secondary to intolerance given history of bradycardia -No recurrent symptoms  -Continue Lovenox for now and restart Xarelto possibly tomorrow  -Continue ASA -Plan for possible stress test today   2. Acute on chronic diastolic CHF: -Last echocardiogram  08/08/2019 with normal LV function, no regional wall motion abnormalities with normal IVC -Chest CT with no pulmonary edema -BNP only 93 -I&O, net negative 1.3L -Weight, 200lb with an admission weight of 209lb  -Given significant elevation in creatinine, would hold Lasix today and restart at 40mg  BID tomorrow and follow renal function closely   3.  CKD stage III: -Creatinine, 1.95 today>>>up from 1.39 yesterday  -Hold Lasix today  -Baseline appears to be in the 1.2-1.4 range -See plan above   4.  History of PE in 2014 on lifelong Xarelto: -On lifelong Xarelto currently held>>> with subsequent high-dose Lovenox for [facility] -Xarelto held for possible LHC?  5.  DM2: -SSI for glucose control while inpatient status -Management per IM   Signed, Kathyrn Drown NP-C [facility] Pager: [phone] 08/13/2019, 9:02 AM    Pt seen and examined  I agree with findings of J McDaniel Pt denies SOB or  CP Complains of L hip pain/ back pain ON exam:    LUngs are CTA  Cardiac RRR  No murmurs   L hip / ischial spine   Mild tender  triv LE edema  Volume is improved  Pt has diuresed   Will hold lasix given bump in Cr Plan on myovue today to r/o ischemia Xray low back / hip area  [name] MD   For questions or updates, please contact   Please consult [email or web address] for contact info under Cardiology/STEMI.

## 2019-08-13 NOTE — Progress Notes (Signed)
Patient complaining of pain of 8/10 on his left leg. Norco given with a sip of water.

## 2019-08-13 NOTE — Progress Notes (Addendum)
   Patient presented for a nuclear stress test today. No immediate complications. BP soft following test with systolic BP dropping to 84. Patient asymptomatic with this and BP improved with systolic in the 295'J prior to me leaving. Stress imaging is pending at this time.   Preliminary EKG findings may be listed in the chart, but the stress test result will not be finalized until perfusion imaging is complete.  Darreld Mclean, PA-C 08/13/2019 2:02 PM   Addendum: Myoview low risk with no signs of ischemia. Notified patient.  Darreld Mclean, PA-C 08/13/2019 5:21 PM

## 2019-08-13 NOTE — Progress Notes (Signed)
Put some more water in patient's home unit for him and made sure it was plugged up in red outlet and ready for patient to use.

## 2019-08-13 NOTE — Plan of Care (Signed)

## 2019-08-14 DIAGNOSIS — M5386 Other specified dorsopathies, lumbar region: Secondary | ICD-10-CM

## 2019-08-14 LAB — GLUCOSE, CAPILLARY
Glucose-Capillary: 240 mg/dL — ABNORMAL HIGH (ref 70–99)
Glucose-Capillary: 223 mg/dL — ABNORMAL HIGH (ref 70–99)
Glucose-Capillary: 310 mg/dL — ABNORMAL HIGH (ref 70–99)
Glucose-Capillary: 325 mg/dL — ABNORMAL HIGH (ref 70–99)
Glucose-Capillary: 336 mg/dL — ABNORMAL HIGH (ref 70–99)
Glucose-Capillary: 260 mg/dL — ABNORMAL HIGH (ref 70–99)

## 2019-08-14 LAB — BASIC METABOLIC PANEL
Anion gap: 12 (ref 5–15)
BUN: 33 mg/dL — ABNORMAL HIGH (ref 8–23)
CO2: 28 mmol/L (ref 22–32)
Calcium: 8.7 mg/dL — ABNORMAL LOW (ref 8.9–10.3)
Chloride: 98 mmol/L (ref 98–111)
Creatinine, Ser: 1.88 mg/dL — ABNORMAL HIGH (ref 0.61–1.24)
GFR calc Af Amer: 39 mL/min — ABNORMAL LOW (ref >60)
GFR calc non Af Amer: 34 mL/min — ABNORMAL LOW (ref >60)
Glucose, Bld: 248 mg/dL — ABNORMAL HIGH (ref 70–99)
Potassium: 4.2 mmol/L (ref 3.5–5.1)
Sodium: 138 mmol/L (ref 135–145)

## 2019-08-14 MED ORDER — RIVAROXABAN 20 MG PO TABS
20.0000 mg | ORAL_TABLET | Freq: Every day | ORAL | Status: DC
  Administered 2019-08-14 – 2019-08-16 (×3): 20 mg via ORAL
  Filled 2019-08-14 (×3): qty 1

## 2019-08-14 MED ORDER — INSULIN ASPART 100 UNIT/ML ~~LOC~~ SOLN
2.0000 [IU] | Freq: Three times a day (TID) | SUBCUTANEOUS | Status: DC
  Administered 2019-08-14 – 2019-08-17 (×7): 2 [IU] via SUBCUTANEOUS

## 2019-08-14 MED ORDER — INSULIN GLARGINE 100 UNIT/ML ~~LOC~~ SOLN
12.0000 [IU] | Freq: Every day | SUBCUTANEOUS | Status: DC
  Administered 2019-08-14: 12 [IU] via SUBCUTANEOUS
  Filled 2019-08-14 (×2): qty 0.12

## 2019-08-14 MED ORDER — PREDNISONE 50 MG PO TABS
60.0000 mg | ORAL_TABLET | Freq: Every day | ORAL | Status: DC
  Administered 2019-08-15: 60 mg via ORAL
  Filled 2019-08-14: qty 1

## 2019-08-14 MED ORDER — INSULIN ASPART 100 UNIT/ML ~~LOC~~ SOLN
0.0000 [IU] | Freq: Three times a day (TID) | SUBCUTANEOUS | Status: DC
  Administered 2019-08-14 – 2019-08-15 (×2): 4 [IU] via SUBCUTANEOUS
  Administered 2019-08-15: 2 [IU] via SUBCUTANEOUS

## 2019-08-14 MED ORDER — DICLOFENAC SODIUM 1 % EX GEL
4.0000 g | Freq: Four times a day (QID) | CUTANEOUS | Status: DC
  Administered 2019-08-14 – 2019-08-17 (×9): 4 g via TOPICAL
  Filled 2019-08-14: qty 100

## 2019-08-14 NOTE — Progress Notes (Addendum)
Progress Note  Patient Name: Steve Brown Date of Encounter: 08/14/2019  Primary Cardiologist: Larae Grooms, MD  Subjective   Breathing is OK   No CP     Inpatient Medications    Scheduled Meds: . aspirin  81 mg Oral Daily  . bisacodyl  10 mg Rectal Once  . diclofenac Sodium  4 g Topical QID  . docusate sodium  100 mg Oral BID  . enoxaparin (LOVENOX) injection  1 mg/kg Subcutaneous Q12H  . insulin aspart  0-6 Units Subcutaneous Q4H  . polyethylene glycol  17 g Oral Daily  . [START ON 08/15/2019] predniSONE  60 mg Oral Q breakfast  . pregabalin  300 mg Oral BID  . senna  1 tablet Oral Daily  . sodium chloride flush  3 mL Intravenous Q12H   Continuous Infusions:  PRN Meds: acetaminophen **OR** acetaminophen, HYDROcodone-acetaminophen, nitroGLYCERIN, ondansetron **OR** ondansetron (ZOFRAN) IV, pneumococcal 23 valent vaccine   Vital Signs    Vitals:   08/13/19 1929 08/13/19 2316 08/14/19 0334 08/14/19 0838  BP: (!) 117/53 122/68 118/67 127/74  Pulse: 68     Resp: 16 13 14 15   Temp: 98.2 F (36.8 C) 98.7 F (37.1 C) 98.6 F (37 C) 98.4 F (36.9 C)  TempSrc: Oral Oral Oral Oral  SpO2:  96% 94% 95%  Weight:      Height:        Intake/Output Summary (Last 24 hours) at 08/14/2019 2330 Last data filed at 08/13/2019 2144 Gross per 24 hour  Intake 13 ml  Output --  Net 13 ml   Net neg 1.3 L   Filed Weights   08/11/19 1631 08/12/19 1330  Weight: 94.8 kg 90.8 kg    Physical Exam   General: Well developed, well nourished, NAD Neck:. No JVD Lungs: No rales  Cardiovascular: RRR with S1 S2. No murmurs Abdomen: Soft, non-tender, distended. No obvious abdominal masses. Extremities: tr to 1+ BLE  edema. Radial  pulses 2+ bilaterally Neuro: Alert and oriented. No focal deficits. No facial asymmetry. MAE spontaneously. Psych: Responds to questions appropriately with normal affect.    Labs    Chemistry Recent Labs  Lab 08/11/19 1730 08/11/19 1730  08/12/19 0424 08/13/19 0110 08/14/19 0307  NA 141   < > 139 137 138  K 3.9   < > 3.8 4.4 4.2  CL 103   < > 99 96* 98  CO2 29   < > 30 30 28   GLUCOSE 61*   < > 176* 196* 248*  BUN 18   < > 16 30* 33*  CREATININE 1.48*   < > 1.39* 1.95* 1.88*  CALCIUM 9.5   < > 9.1 9.1 8.7*  PROT 6.7  --  6.5  --   --   ALBUMIN 3.6  --  3.4* 3.5  --   AST 21  --  19  --   --   ALT 19  --  19  --   --   ALKPHOS 65  --  62  --   --   BILITOT 0.9  --  1.1  --   --   GFRNONAA 45*   < > 49* 32* 34*  GFRAA 52*   < > 56* 37* 39*  ANIONGAP 9   < > 10 11 12    < > = values in this interval not displayed.     Hematology Recent Labs  Lab 08/11/19 1730 08/12/19 0424 08/13/19 0110  WBC 5.4  5.0 5.0  RBC 4.20* 4.20* 4.08*  HGB 13.3 13.5 12.9*  HCT 39.6 39.6 38.0*  MCV 94.3 94.3 93.1  MCH 31.7 32.1 31.6  MCHC 33.6 34.1 33.9  RDW 12.6 12.6 12.7  PLT 199 220 222    Cardiac EnzymesNo results for input(s): TROPONINI in the last 168 hours. No results for input(s): TROPIPOC in the last 168 hours.   BNP Recent Labs  Lab 08/11/19 1730  BNP 70.3     DDimer  Recent Labs  Lab 08/11/19 1742  DDIMER 1.24*     Radiology    CT LUMBAR SPINE WO CONTRAST  Result Date: 08/13/2019 CLINICAL DATA:  Low back pain EXAM: CT LUMBAR SPINE WITHOUT CONTRAST TECHNIQUE: Multidetector CT imaging of the lumbar spine was performed without intravenous contrast administration. Multiplanar CT image reconstructions were also generated. COMPARISON:  Radiography 11/12/2016 FINDINGS: Segmentation: 5 lumbar type vertebral bodies. Alignment: Normal Vertebrae: Old minor superior endplate depression at L4. No other focal bone finding. No evidence of recent fracture. Paraspinal and other soft tissues: Aortic atherosclerosis. No other finding of note. Disc levels: T11-12: Disc degeneration with endplate osteophytes and mild bulging of the disc. No compressive canal or foraminal narrowing. T12-L1: Normal interspace. L1-2: Normal  interspace. L2-3: Bulging of the disc more towards the right. No canal stenosis. Mild right foraminal narrowing, not definitely compressive. L3-4: Bulging of the disc. Mild facet and ligamentous prominence. Mild canal stenosis, not likely compressive. L4-5: Advanced bilateral facet arthropathy. No slippage in this position. Anterolisthesis would likely occur with standing or flexion. Circumferential protrusion of the disc. Severe stenosis that could cause neural compression on either or both sides. The appearance would be worse with standing or flexion. L5-S1: Endplate osteophytes and central disc protrusion. Mild facet degeneration and hypertrophy. No visible neural compression. Bridging osteophytes at both sacroiliac joints. IMPRESSION: No acute fracture.  Old mild superior endplate depression at L4. Severe multifactorial spinal stenosis at L4-5, there would likely worsen with standing or flexion. Advanced bilateral facet arthropathy with facet and ligamentous hypertrophy and circumferential protrusion of the disc. L2-3: Disc bulge with mild prominence on the right. Mild right foraminal narrowing, not definitely compressive. L3-4: Centrally predominant disc bulge. Mild spinal stenosis without clear neural compression. L5-S1: Small central disc protrusion. No distinct neural compression. Electronically Signed   By: Nelson Chimes M.D.   On: 08/13/2019 15:17   NM Myocar Multi W/Spect W/Wall Motion / EF  Result Date: 08/13/2019 CLINICAL DATA:  79 year old male with a history of cardiomyopathy EXAM: MYOCARDIAL IMAGING WITH SPECT (REST AND PHARMACOLOGIC-STRESS) GATED LEFT VENTRICULAR WALL MOTION STUDY LEFT VENTRICULAR EJECTION FRACTION TECHNIQUE: Standard myocardial SPECT imaging was performed after resting intravenous injection of 10 mCi Tc-53m tetrofosmin. Subsequently, intravenous infusion of Lexiscan was performed under the supervision of the Cardiology staff. At peak effect of the drug, 30 mCi Tc-59m tetrofosmin  was injected intravenously and standard myocardial SPECT imaging was performed. Quantitative gated imaging was also performed to evaluate left ventricular wall motion, and estimate left ventricular ejection fraction. COMPARISON:  None. FINDINGS: Perfusion: No decreased activity in the left ventricle on stress imaging to suggest reversible ischemia or infarction. Wall Motion: Normal left ventricular wall motion. No left ventricular dilation. Left Ventricular Ejection Fraction: 54 % End diastolic volume 57 ml End systolic volume 26 ml IMPRESSION: 1. No reversible ischemia or infarction. 2. Normal left ventricular wall motion. 3. Left ventricular ejection fraction 54% 4. Non invasive risk stratification*: Low *2012 Appropriate Use Criteria for Coronary Revascularization Focused Update: J  Am Coll Cardiol. 9735;32(9):924-268. http://[email or web address].aspx?articleid=[email or web address] Electronically Signed   By: Corrie Mckusick D.O.   On: 08/13/2019 16:09   VAS [place] LOWER EXTREMITY VENOUS (DVT)  Result Date: 08/12/2019  Lower Venous DVTStudy Indications: Edema, and SOB.  Comparison Study: Prior study 07-01-2012 LEV bilateral negative for DVT Performing Technologist: Darlin Coco  Examination Guidelines: A complete evaluation includes B-mode imaging, spectral Doppler, color Doppler, and power Doppler as needed of all accessible portions of each vessel. Bilateral testing is considered an integral part of a complete examination. Limited examinations for reoccurring indications may be performed as noted. The reflux portion of the exam is performed with the patient in reverse Trendelenburg.  +-----+---------------+---------+-----------+----------+--------------+ RIGHTCompressibilityPhasicitySpontaneityPropertiesThrombus Aging +-----+---------------+---------+-----------+----------+--------------+ CFV  Full           Yes      Yes                                  +-----+---------------+---------+-----------+----------+--------------+   +---------+---------------+---------+-----------+----------+--------------+ LEFT     CompressibilityPhasicitySpontaneityPropertiesThrombus Aging +---------+---------------+---------+-----------+----------+--------------+ CFV      Full           Yes      Yes                                 +---------+---------------+---------+-----------+----------+--------------+ SFJ      Partial                                                     +---------+---------------+---------+-----------+----------+--------------+ FV Prox  Full                                                        +---------+---------------+---------+-----------+----------+--------------+ FV Mid   Full                                                        +---------+---------------+---------+-----------+----------+--------------+ FV DistalFull                                                        +---------+---------------+---------+-----------+----------+--------------+ PFV      Full                                                        +---------+---------------+---------+-----------+----------+--------------+ POP      Full           Yes      Yes                                 +---------+---------------+---------+-----------+----------+--------------+  PTV      Full                                                        +---------+---------------+---------+-----------+----------+--------------+ PERO     Full                                                        +---------+---------------+---------+-----------+----------+--------------+     Summary: RIGHT: - No evidence of common femoral vein obstruction.  LEFT: - There is no evidence of deep vein thrombosis in the lower extremity.  - No cystic structure found in the popliteal fossa.  *See table(s) above for measurements and observations. Electronically signed  by Deitra Mayo MD on 08/12/2019 at 11:03:33 AM.    Final    Telemetry    SR - Personally Reviewed  ECG    - Personally Reviewed  Cardiac Studies  \MYOVUE   08/13/19  FINDINGS: Perfusion: No decreased activity in the left ventricle on stress imaging to suggest reversible ischemia or infarction.  Wall Motion: Normal left ventricular wall motion. No left ventricular dilation.  Left Ventricular Ejection Fraction: 54 %  End diastolic volume 57 ml  End systolic volume 26 ml  IMPRESSION: 1. No reversible ischemia or infarction.  2. Normal left ventricular wall motion.  3. Left ventricular ejection fraction 54%  4. Non invasive risk stratification*: Low      LCP 2019:  "Acute Mrg lesion is 25% stenosed.  Mid RCA lesion is 25% stenosed.  2nd Mrg lesion is 40% stenosed.  Mid Cx lesion is 40% stenosed.  Previously placed Mid LAD stent (unknown type) is widely patent.  Prox LAD-1 lesion is 25% stenosed.  Prox LAD-2 lesion is 25% stenosed.  The left ventricular systolic function is normal.  LV end diastolic pressure is normal.  The left ventricular ejection fraction is 55-65% by visual estimate.  There is no aortic valve stenosis.  Recent Echo 1. Normal GLS -17.5. Left ventricular ejection fraction, by estimation,  is 60 to 65%. The left ventricle has normal function. The left ventricle  has no regional wall motion abnormalities. Left ventricular diastolic  parameters were normal.  2. Right ventricular systolic function is normal. The right ventricular  size is normal. There is mildly elevated pulmonary artery systolic  pressure.  3. The mitral valve is normal in structure. Trivial mitral valve  regurgitation. No evidence of mitral stenosis.  4. The aortic valve is tricuspid. Aortic valve regurgitation is trivial.  Mild aortic valve sclerosis is present, with no evidence of aortic valve  stenosis.  5. Mild pulmonic stenosis.  6.  The inferior vena cava is normal in size with greater than 50% respiratory variability, suggesting right atrial pressure of 3 mmHg.  Patient Profile     79 y.o. male with a hx of CAD s/p Prox LAD Stent in 2010, last LCP 2014; PE on Xarelto, CKD IIIB Creatine ~ 1.48; IDDM (Type II), Prior stroke 3/21 without residual deficit, OSAwho is being seen today for the evaluation of chest pain and shortness of breathat the request of ED Team [facility]).  Assessment & Plan    1. Chest Pain/Unstable angina: -  Denies at present   Myovue yesterday shows no ischemia    Keep on medical Rx  Rsume Xarelto    2. Acute on chronic diastolic CHF: -Last echocardiogram 08/08/2019 with normal LV function, no regional wall motion abnormalities with normal IVC -Chest CT with no pulmonary edema -BNP only 93 Pt has diuresed since admit   HOld further with bump in Cr   3.  CKD stage III: -Creatinine, 1.88 today   Hold further diuresis   Note 7 months ago Cr 1.2      Discussed limiting Na in diet    4.  History of PE in 2014 on lifelong Xarelto: -   Resume Xarelto  Stop ASA    5.  DM2:  -Management per IM  6  Leg pain   CT yesterday shows severe DJD of back     Will sign off   Call with questions    [name] MD   For questions or updates, please contact   Please consult [email or web address] for contact info under Cardiology/STEMI.

## 2019-08-14 NOTE — Evaluation (Signed)
Physical Therapy Evaluation Patient Details Name: Steve Brown MRN: [id number] DOB: 08/09/1940 Today's Date: 08/14/2019   History of Present Illness  Pt adm with chest pain and found to have acute on chronic diastolic heart failure. PMH - CAD, DM, CVA, CKD, PE, chronic back pain  Clinical Impression  Pt presents to PT with decr gait primarily due to LLE pain. Difficult to tell from pt exactly when this pain started to impact his mobility and function. Recommend pt have HHPT at DC as well as rolling walker. Pt states he will have assist to get up his 16 steps into his apartment.     Follow Up Recommendations Home health PT;Supervision - Intermittent    Equipment Recommendations  Rolling walker with 5" wheels    Recommendations for Other Services       Precautions / Restrictions        Mobility  Bed Mobility               General bed mobility comments: Pt up in chair  Transfers Overall transfer level: Needs assistance Equipment used: Rolling walker (2 wheeled) Transfers: Sit to/from Stand Sit to Stand: Min guard         General transfer comment: Assist for safety. Inct time and effort to rise  Ambulation/Gait Ambulation/Gait assistance: Supervision Gait Distance (Feet): 170 Feet Assistive device: Rolling walker (2 wheeled) Gait Pattern/deviations: Step-through pattern;Decreased step length - right;Decreased step length - left;Antalgic;Decreased stance time - left Gait velocity: decr Gait velocity interpretation: <1.31 ft/sec, indicative of household ambulator General Gait Details: Assist for safety. Pt using UE's to compensate for painful LLE  Stairs            Wheelchair Mobility    Modified Rankin (Stroke Patients Only)       Balance Overall balance assessment: Needs assistance Sitting-balance support: No upper extremity supported;Feet supported Sitting balance-Leahy Scale: Good     Standing balance support: No upper extremity  supported Standing balance-Leahy Scale: Fair Standing balance comment: needs UE support for any dynamic movement                             Pertinent Vitals/Pain Pain Assessment: 0-10 Pain Score: 6  Pain Location: lt shoulder, back, lt leg Pain Descriptors / Indicators: Shooting Pain Intervention(s): Limited activity within patient's tolerance;Monitored during session;Repositioned    Home Living Family/patient expects to be discharged to:: Private residence Living Arrangements: Alone Available Help at Discharge: Friend(s);Available PRN/intermittently Type of Home: Apartment Home Access: Stairs to enter Entrance Stairs-Rails: Right Entrance Stairs-Number of Steps: 16 Home Layout: One level Home Equipment: Cane - single point      Prior Function Level of Independence: Independent with assistive device(s)         Comments: Uses cane at times. Drives, grocery shopping etc     Hand Dominance   Dominant Hand: Right    Extremity/Trunk Assessment   Upper Extremity Assessment Upper Extremity Assessment: Defer to OT evaluation    Lower Extremity Assessment Lower Extremity Assessment: Generalized weakness (LLE >3/5 but functionally limited by pain)       Communication   Communication: No difficulties  Cognition Arousal/Alertness: Awake/alert Behavior During Therapy: WFL for tasks assessed/performed Overall Cognitive Status: Within Functional Limits for tasks assessed  General Comments      Exercises     Assessment/Plan    PT Assessment Patient needs continued PT services  PT Problem List Decreased strength;Decreased activity tolerance;Decreased balance;Decreased mobility;Pain       PT Treatment Interventions DME instruction;Gait training;Stair training;Functional mobility training;Therapeutic activities;Therapeutic exercise;Balance training;Patient/family education    PT Goals (Current goals  can be found in the Care Plan section)  Acute Rehab PT Goals Patient Stated Goal: return home PT Goal Formulation: With patient Time For Goal Achievement: 08/28/19 Potential to Achieve Goals: Good    Frequency Min 3X/week   Barriers to discharge Inaccessible home environment;Decreased caregiver support Lives alone in second story apartment    Co-evaluation               AM-PAC PT "6 Clicks" Mobility  Outcome Measure Help needed turning from your back to your side while in a flat bed without using bedrails?: A Little Help needed moving from lying on your back to sitting on the side of a flat bed without using bedrails?: A Little Help needed moving to and from a bed to a chair (including a wheelchair)?: A Little Help needed standing up from a chair using your arms (e.g., wheelchair or bedside chair)?: A Little Help needed to walk in hospital room?: A Little Help needed climbing 3-5 steps with a railing? : A Little 6 Click Score: 18    End of Session Equipment Utilized During Treatment: Gait belt Activity Tolerance: Patient limited by pain Patient left: in chair;with call bell/phone within reach Nurse Communication: Mobility status PT Visit Diagnosis: Other abnormalities of gait and mobility (R26.89);Pain Pain - Right/Left: Left Pain - part of body: Leg    Time: 1829-9371 PT Time Calculation (min) (ACUTE ONLY): 37 min   Charges:   PT Evaluation $PT Eval Moderate Complexity: 1 Mod PT Treatments $Gait Training: 8-22 mins        Lawrence Pager [phone] Office [place] 08/14/2019, 4:09 PM

## 2019-08-14 NOTE — Plan of Care (Signed)

## 2019-08-14 NOTE — Progress Notes (Signed)
Inpatient Diabetes Program Recommendations  AACE/ADA: New Consensus Statement on Inpatient Glycemic Control (2015)  Target Ranges:  Prepandial:   less than 140 mg/dL      Peak postprandial:   less than 180 mg/dL (1-2 hours)      Critically ill patients:  140 - 180 mg/dL   Lab Results  Component Value Date   GLUCAP 310 (H) 08/14/2019   HGBA1C 7.5 (H) 08/12/2019    Review of Glycemic Control Results for ESTEVON, FLUKE (MRN [id number]) as of 08/14/2019 13:07  Ref. Range 08/13/2019 23:15 08/14/2019 03:36 08/14/2019 08:12 08/14/2019 11:36  Glucose-Capillary Latest Ref Range: 70 - 99 mg/dL 285 (H) 240 (H) 223 (H) 310 (H)   Diabetes history: DM 2 Outpatient Diabetes medications: Tresiba 26 units q AM, Humalog 6-10 units tid with meals Current orders for Inpatient glycemic control:  Novolog 0-6 units q 4 hours  Inpatient Diabetes Program Recommendations:    Consider adding Lantus 12 units daily.  Also consider changing Novolog correction to tid with meals and HS (instead of q 4 hours).  Please add Novolog meal coverage 2 units tid with meals.   Thanks  Adah Perl, RN, BC-ADM Inpatient Diabetes Coordinator Pager [phone] (8a-5p)

## 2019-08-14 NOTE — Progress Notes (Signed)
PROGRESS NOTE    Steve Brown  MGQ:[id number] DOB: 1940/10/04 DOA: 08/11/2019 PCP: Lawerance Cruel, MD    Brief Narrative:  Steve Brown is a 79 year old male with past medical history CAD status post PCI/DES 2010, insulin-dependent diabetes mellitus, history of CVA, OSA on CPAP, history of PE on Xarelto, CKD stage IIIb who presented to the ED with lower extremity swelling, abdominal swelling and chest pain.  In the ED, patient received aspirin 3 and 25 mg and sublingual nitroglycerin with relief of his chest pain. He was given Lasix 60 mg IV. Cardiology was consulted and EDP requested hospitalist admission for further evaluation and treatment.   Assessment & Plan:   Active Problems:   Diabetes mellitus type 2 with complications (HCC)   HTN (hypertension)   CAD in native artery   History of recurrent TIAs   History of pulmonary embolism: June 2014,  Takes Xarelto   OSA on CPAP   CKD (chronic kidney disease), stage II   Benign essential HTN   Apnea, sleep   Ischemic chest pain (HCC)   Nonspecific chest pain   Ileus (HCC)   Chest pain   Chest pain Presenting to ED with 1 week history of worsening chest pain in the setting of known CAD with prior stenting LAD 2010.Troponin 12>12>12>11>12; flat.  Underwent Lexiscan Myoview stress test on 08/13/2019 without ischemia or infarction noted.  --Continues to be chest pain-free, not on beta-blocker secondary to intolerance given history of bradycardia --Resume Xarelto today --Cardiology signing off  Acute on chronic diastolic congestive heart failure TTE 08/08/2019 with LVEF and no regional wall motion abnormalities. CT chest negative for PE or pulmonary edema. BNP 93. Was started on aggressive IV diuresis with furosemide 60 mg IV every 12 hours, now on hold given bump in creatinine. --net negative 1.3L since admission --Continue strict I's and O's and daily weights --Close monitoring of creatinine --Continue hold  furosemide  CKD stage IIIb Baseline and 1.2-1.4. --Cr 1.48>1.39>1.95>1.88 --Lasix currently on hold due to bump in creatinine --Continue to monitor renal function closely daily  History of pulmonary embolism, 2014 On Xarelto outpatient, currently being held for possible need of LHC. CT angiogram chest with no pulmonary embolism. Left lower extremity Doppler ultrasound negative for DVT. --Resume home Xarelto today  Insulin-dependent diabetes mellitus On Tresiba 26u [place] daily, and Humalog 6-10u [place] TIDAC ISS at home.  Hemoglobin A1c 7.5 on 08/12/2019. --Diabetic educator following, appreciate assistance --very sensitive insulin sliding scale for further coverage --CBGs every 4 hours  Left leg pain Patient with history of chronic low back pain on oxycodone 20 mg every 4 hours as needed outpatient.  Complains of left leg pain with hip flexion that radiates down the posterior thigh.  Concern for possible sciatica.  CT L-spine notable for multilevel degenerative disc disease with severe stenosis L4/5. --Start prednisone 60 mg p.o. daily --Avoid NSAIDs in the setting of CKD --Continue home oxycodone --PT/OT evaluation pending   DVT prophylaxis: Lovenox Code Status: Full code Family Communication: No family present at bedside this morning  Disposition Plan:  Status is: Inpatient  Remains inpatient appropriate because:Ongoing diagnostic testing needed not appropriate for outpatient work up   Dispo: The patient is from: Home              Anticipated d/c is to: Home              Anticipated d/c date is: 1 day  Patient currently is not medically stable to d/c.    Consultants:   Cardiology  Procedures:   none  Antimicrobials:   none   Subjective: Patient seen and examined at bedside, lying in bed.  Continues to complain of left leg pain with difficulty ambulating. Feels it starts at his buttock region and radiates down his posterior leg past his knee. No other  complaints or concerns at this time. States chest pain continues to be resolved.  Stress test yesterday negative for acute or reversible ischemia.  Cardiology signing off today.  CT L-spine notable for multilevel degenerative disc disease with severe stenosis L4/5.  Denies headache, no fever/chills/night sweats, no nausea/vomit/diarrhea, no chest pain, no palpitations, no shortness of breath, no abdominal pain. No acute events overnight per nursing staff.  Objective: Vitals:   08/13/19 1929 08/13/19 2316 08/14/19 0334 08/14/19 0838  BP: (!) 117/53 122/68 118/67 127/74  Pulse: 68     Resp: 16 13 14 15   Temp: 98.2 F (36.8 C) 98.7 F (37.1 C) 98.6 F (37 C) 98.4 F (36.9 C)  TempSrc: Oral Oral Oral Oral  SpO2:  96% 94% 95%  Weight:      Height:        Intake/Output Summary (Last 24 hours) at 08/14/2019 1150 Last data filed at 08/13/2019 2144 Gross per 24 hour  Intake 3 ml  Output --  Net 3 ml   Filed Weights   08/11/19 1631 08/12/19 1330  Weight: 94.8 kg 90.8 kg    Examination:  General exam: Appears calm and comfortable  Respiratory system: Clear to auscultation. Respiratory effort normal.  Oxygenating well on room air Cardiovascular system: S1 & S2 heard, RRR. No JVD, murmurs, rubs, gallops or clicks.  1+ pitting edema bilateral lower extremities Gastrointestinal system: Abdomen is nondistended, soft and nontender. No organomegaly or masses felt. Normal bowel sounds heard. Central nervous system: Alert and oriented. No focal neurological deficits. Extremities: Symmetric 5 x 5 power. Pain with left hip flexion Skin: No rashes, lesions or ulcers Psychiatry: Judgement and insight appear normal. Mood & affect appropriate.     Data Reviewed: I have personally reviewed following labs and imaging studies  CBC: Recent Labs  Lab 08/11/19 1730 08/12/19 0424 08/13/19 0110  WBC 5.4 5.0 5.0  NEUTROABS 2.7 2.7 2.3  HGB 13.3 13.5 12.9*  HCT 39.6 39.6 38.0*  MCV 94.3 94.3 93.1   PLT 199 220 220   Basic Metabolic Panel: Recent Labs  Lab 08/11/19 1730 08/12/19 0424 08/13/19 0110 08/14/19 0307  NA 141 139 137 138  K 3.9 3.8 4.4 4.2  CL 103 99 96* 98  CO2 29 30 30 28   GLUCOSE 61* 176* 196* 248*  BUN 18 16 30* 33*  CREATININE 1.48* 1.39* 1.95* 1.88*  CALCIUM 9.5 9.1 9.1 8.7*  MG  --  1.7  --   --   PHOS  --  4.0 4.8*  --    GFR: Estimated Creatinine Clearance: 34.1 mL/min (A) (by C-G formula based on SCr of 1.88 mg/dL (H)). Liver Function Tests: Recent Labs  Lab 08/11/19 1730 08/12/19 0424 08/13/19 0110  AST 21 19  --   ALT 19 19  --   ALKPHOS 65 62  --   BILITOT 0.9 1.1  --   PROT 6.7 6.5  --   ALBUMIN 3.6 3.4* 3.5   No results for input(s): LIPASE, AMYLASE in the last 168 hours. No results for input(s): AMMONIA in the last 168 hours. Coagulation Profile:  No results for input(s): INR, PROTIME in the last 168 hours. Cardiac Enzymes: No results for input(s): CKTOTAL, CKMB, CKMBINDEX, TROPONINI in the last 168 hours. BNP (last 3 results) Recent Labs    08/06/19 1243  PROBNP 93   HbA1C: Recent Labs    08/12/19 0231  HGBA1C 7.5*   CBG: Recent Labs  Lab 08/13/19 1932 08/13/19 2315 08/14/19 0336 08/14/19 0812 08/14/19 1136  GLUCAP 334* 285* 240* 223* 310*   Lipid Profile: Recent Labs    08/12/19 0424  CHOL 203*  HDL 44  LDLCALC 138*  TRIG 107  CHOLHDL 4.6   Thyroid Function Tests: Recent Labs    08/12/19 0424  TSH 1.536   Anemia Panel: No results for input(s): VITAMINB12, FOLATE, FERRITIN, TIBC, IRON, RETICCTPCT in the last 72 hours. Sepsis Labs: No results for input(s): PROCALCITON, LATICACIDVEN in the last 168 hours.  Recent Results (from the past 240 hour(s))  SARS Coronavirus 2 by RT PCR (hospital order, performed in [facility] hospital lab) Nasopharyngeal Nasopharyngeal Swab     Status: None   Collection Time: 08/11/19  9:34 PM   Specimen: Nasopharyngeal Swab  Result Value Ref Range Status   SARS  Coronavirus 2 NEGATIVE NEGATIVE Final    Comment: (NOTE) SARS-CoV-2 target nucleic acids are NOT DETECTED.  The SARS-CoV-2 RNA is generally detectable in upper and lower respiratory specimens during the acute phase of infection. The lowest concentration of SARS-CoV-2 viral copies this assay can detect is 250 copies / mL. A negative result does not preclude SARS-CoV-2 infection and should not be used as the sole basis for treatment or other patient management decisions.  A negative result may occur with improper specimen collection / handling, submission of specimen other than nasopharyngeal swab, presence of viral mutation(s) within the areas targeted by this assay, and inadequate number of viral copies (<250 copies / mL). A negative result must be combined with clinical observations, patient history, and epidemiological information.  Fact Sheet for Patients:   StrictlyIdeas.no  Fact Sheet for Healthcare Providers: BankingDealers.co.za  This test is not yet approved or  cleared by the [place] FDA and has been authorized for detection and/or diagnosis of SARS-CoV-2 by FDA under an Emergency Use Authorization (EUA).  This EUA will remain in effect (meaning this test can be used) for the duration of the COVID-19 declaration under Section 564(b)(1) of the Act, 21 U.S.C. section 360bbb-3(b)(1), unless the authorization is terminated or revoked sooner.  Performed at [facility], [place] [address], [place], [place] [address]   MRSA PCR Screening     Status: None   Collection Time: 08/12/19  3:39 PM   Specimen: Nasal Mucosa; Nasopharyngeal  Result Value Ref Range Status   MRSA by PCR NEGATIVE NEGATIVE Final    Comment:        The GeneXpert MRSA Assay (FDA approved for NASAL specimens only), is one component of a comprehensive MRSA colonization surveillance program. It is not intended to diagnose MRSA infection nor to guide  or monitor treatment for MRSA infections. Performed at [facility], [place] [address], [place], [place] [address]          Radiology Studies: CT LUMBAR SPINE WO CONTRAST  Result Date: 08/13/2019 CLINICAL DATA:  Low back pain EXAM: CT LUMBAR SPINE WITHOUT CONTRAST TECHNIQUE: Multidetector CT imaging of the lumbar spine was performed without intravenous contrast administration. Multiplanar CT image reconstructions were also generated. COMPARISON:  Radiography 11/12/2016 FINDINGS: Segmentation: 5 lumbar type vertebral bodies.  Alignment: Normal Vertebrae: Old minor superior endplate depression at L4. No other focal bone finding. No evidence of recent fracture. Paraspinal and other soft tissues: Aortic atherosclerosis. No other finding of note. Disc levels: T11-12: Disc degeneration with endplate osteophytes and mild bulging of the disc. No compressive canal or foraminal narrowing. T12-L1: Normal interspace. L1-2: Normal interspace. L2-3: Bulging of the disc more towards the right. No canal stenosis. Mild right foraminal narrowing, not definitely compressive. L3-4: Bulging of the disc. Mild facet and ligamentous prominence. Mild canal stenosis, not likely compressive. L4-5: Advanced bilateral facet arthropathy. No slippage in this position. Anterolisthesis would likely occur with standing or flexion. Circumferential protrusion of the disc. Severe stenosis that could cause neural compression on either or both sides. The appearance would be worse with standing or flexion. L5-S1: Endplate osteophytes and central disc protrusion. Mild facet degeneration and hypertrophy. No visible neural compression. Bridging osteophytes at both sacroiliac joints. IMPRESSION: No acute fracture.  Old mild superior endplate depression at L4. Severe multifactorial spinal stenosis at L4-5, there would likely worsen with standing or flexion. Advanced bilateral facet arthropathy with facet and ligamentous hypertrophy and  circumferential protrusion of the disc. L2-3: Disc bulge with mild prominence on the right. Mild right foraminal narrowing, not definitely compressive. L3-4: Centrally predominant disc bulge. Mild spinal stenosis without clear neural compression. L5-S1: Small central disc protrusion. No distinct neural compression. Electronically Signed   By: Nelson Chimes M.D.   On: 08/13/2019 15:17   NM Myocar Multi W/Spect W/Wall Motion / EF  Result Date: 08/13/2019 CLINICAL DATA:  79 year old male with a history of cardiomyopathy EXAM: MYOCARDIAL IMAGING WITH SPECT (REST AND PHARMACOLOGIC-STRESS) GATED LEFT VENTRICULAR WALL MOTION STUDY LEFT VENTRICULAR EJECTION FRACTION TECHNIQUE: Standard myocardial SPECT imaging was performed after resting intravenous injection of 10 mCi Tc-20m tetrofosmin. Subsequently, intravenous infusion of Lexiscan was performed under the supervision of the Cardiology staff. At peak effect of the drug, 30 mCi Tc-76m tetrofosmin was injected intravenously and standard myocardial SPECT imaging was performed. Quantitative gated imaging was also performed to evaluate left ventricular wall motion, and estimate left ventricular ejection fraction. COMPARISON:  None. FINDINGS: Perfusion: No decreased activity in the left ventricle on stress imaging to suggest reversible ischemia or infarction. Wall Motion: Normal left ventricular wall motion. No left ventricular dilation. Left Ventricular Ejection Fraction: 54 % End diastolic volume 57 ml End systolic volume 26 ml IMPRESSION: 1. No reversible ischemia or infarction. 2. Normal left ventricular wall motion. 3. Left ventricular ejection fraction 54% 4. Non invasive risk stratification*: Low *2012 Appropriate Use Criteria for Coronary Revascularization Focused Update: J Am Coll Cardiol. 5102;58(5):277-824. http://content.airportbarriers.com.aspx?articleid=[email or web address] Electronically Signed   By: Corrie Mckusick D.O.   On: 08/13/2019 16:09        Scheduled  Meds: . bisacodyl  10 mg Rectal Once  . diclofenac Sodium  4 g Topical QID  . docusate sodium  100 mg Oral BID  . insulin aspart  0-6 Units Subcutaneous Q4H  . polyethylene glycol  17 g Oral Daily  . [START ON 08/15/2019] predniSONE  60 mg Oral Q breakfast  . pregabalin  300 mg Oral BID  . rivaroxaban  20 mg Oral Q supper  . senna  1 tablet Oral Daily  . sodium chloride flush  3 mL Intravenous Q12H   Continuous Infusions:   LOS: 2 days    Time spent: 38 minutes spent on chart review, discussion with nursing staff, consultants, updating family and interview/physical exam; more than 50% of that time  was spent in counseling and/or coordination of care.    Renne Platts J [place] DO [facility] Available via Epic secure chat 7am-7pm After these hours, please refer to coverage provider listed on [email or web address] 08/14/2019, 11:50 AM

## 2019-08-14 NOTE — Plan of Care (Signed)
  Problem: Education: Goal: Knowledge of General Education information will improve Description: Including pain rating scale, medication(s)/side effects and non-pharmacologic comfort measures Outcome: Progressing   Problem: Health Behavior/Discharge Planning: Goal: Ability to manage health-related needs will improve Outcome: Progressing   Problem: Clinical Measurements: Goal: Will remain free from infection Outcome: Progressing Goal: Diagnostic test results will improve Outcome: Progressing   Problem: Nutrition: Goal: Adequate nutrition will be maintained Outcome: Progressing   

## 2019-08-14 NOTE — Progress Notes (Addendum)
Steve Brown for Lovenox Indication: chest pain/ACS/hx PE  Allergies  Allergen Reactions  . Other Other (See Comments)    Per patient- cardiac cath dye-  "woke up during procedure hysterical."  . Phenergan [Promethazine] Other (See Comments)    Mood changes   . Tramadol     Patient Measurements: Height: 5\' 5"  (165.1 cm) Weight: 90.8 kg (200 lb 2.8 oz) IBW/kg (Calculated) : 61.5  Vital Signs: Temp: 98.4 F (36.9 C) (08/05 0838) Temp Source: Oral (08/05 0838) BP: 127/74 (08/05 0838)  Labs: Recent Labs    08/11/19 1730 08/11/19 1730 08/11/19 2135 08/12/19 0015 08/12/19 0231 08/12/19 0424 08/13/19 0110 08/14/19 0307  HGB 13.3   < >  --   --   --  13.5 12.9*  --   HCT 39.6  --   --   --   --  39.6 38.0*  --   PLT 199  --   --   --   --  220 222  --   CREATININE 1.48*   < >  --   --   --  1.39* 1.95* 1.88*  TROPONINIHS 12   < > 12 11 12   --   --   --    < > = values in this interval not displayed.    Estimated Creatinine Clearance: 34.1 mL/min (A) (by C-G formula based on SCr of 1.88 mg/dL (H)).   Medical History: Past Medical History:  Diagnosis Date  . Abnormal prostate biopsy   . Anticoagulant long-term use    currently xarelto  . BPH with elevated PSA   . CKD (chronic kidney disease), stage II   . Complication of anesthesia    limted neck rom limited use of left arm due to cva  . Coronary artery disease    CARDIOLOGIST-  DR Irish Lack--  2010-- PCI w/ stenting midLAD  . DDD (degenerative disc disease), lumbar   . Degeneration of cervical intervertebral disc   . Depression   . Dyspnea on exertion   . GERD (gastroesophageal reflux disease)   . Hemiparesis due to cerebral infarction   . History of cerebrovascular accident (CVA) with residual deficit 2002 and 2003--  hemiparisis both sides   per MRI  anterior left frontal lobe, left para midline pons, and inferior cerebullam bilaterally infarcts  . History of pulmonary  embolus (PE)    06-30-2012  extensive bilaterally  . History of recurrent TIAs   . History of syncope    hx multiple pre-syncope and syncopal episodes due to vasovagal, orthostatic hypotension, dehydration  . History of TIAs    several since 2002  . Hyperlipidemia   . Hypertension   . Mild atherosclerosis of carotid artery, bilateral    per last duplex 11-04-2014  bilateral ICA 1--39%  . Neuropathy    fingers  . OSA on CPAP    followed by dr dohmeier--  sev. osa w/ AHI 65.9  . Prostate cancer ([place]) dx 2018  . Renal insufficiency   . S/P coronary artery stent placement 2010   stenting to mid LAD  . Simple renal cyst    bilaterally  . Stroke ([place])   . Trigger finger of both hands 11-17-13  . Type 2 diabetes mellitus ([place]) dx 1986   last one A1c 9.2 on 04-26-2016  . Unsteady gait    . Hx prior CVA/TIAs;  . Vertebral artery occlusion, left    chronic   Assessment: 89 YOM presenting with CP,  hx CAD.  Hx PE on Xarelto PTA, last dose 8/2 AM.  Has been on enoxaparin 1 mg/kg every 12 hours. Scr did increase from 1.39>1.95 - now down slightly to 1.88. On Xarelto 20 mg PTA for hx VTE - okay with TBW CrCl 42 mL/min but limited evidence with CrCl<30 mL/min - will monitor Scr closely.       Plan:  Stop enoxaparin  Will restart Xarelto 20 mg as PTA tonight  Monitor renal fx closely  Antonietta Jewel, PharmD, BCCCP Clinical Pharmacist  Phone: [phone] 08/14/2019 11:07 AM  Please check AMION for all [place] phone numbers After 10:00 PM, call [place] [phone]

## 2019-08-15 DIAGNOSIS — M48061 Spinal stenosis, lumbar region without neurogenic claudication: Secondary | ICD-10-CM

## 2019-08-15 DIAGNOSIS — R0789 Other chest pain: Secondary | ICD-10-CM

## 2019-08-15 LAB — GLUCOSE, CAPILLARY
Glucose-Capillary: 211 mg/dL — ABNORMAL HIGH (ref 70–99)
Glucose-Capillary: 186 mg/dL — ABNORMAL HIGH (ref 70–99)
Glucose-Capillary: 319 mg/dL — ABNORMAL HIGH (ref 70–99)
Glucose-Capillary: 473 mg/dL — ABNORMAL HIGH (ref 70–99)
Glucose-Capillary: 360 mg/dL — ABNORMAL HIGH (ref 70–99)

## 2019-08-15 LAB — BASIC METABOLIC PANEL
Anion gap: 10 (ref 5–15)
BUN: 32 mg/dL — ABNORMAL HIGH (ref 8–23)
CO2: 29 mmol/L (ref 22–32)
Calcium: 9 mg/dL (ref 8.9–10.3)
Chloride: 99 mmol/L (ref 98–111)
Creatinine, Ser: 1.53 mg/dL — ABNORMAL HIGH (ref 0.61–1.24)
GFR calc Af Amer: 50 mL/min — ABNORMAL LOW (ref >60)
GFR calc non Af Amer: 43 mL/min — ABNORMAL LOW (ref >60)
Glucose, Bld: 194 mg/dL — ABNORMAL HIGH (ref 70–99)
Potassium: 3.9 mmol/L (ref 3.5–5.1)
Sodium: 138 mmol/L (ref 135–145)

## 2019-08-15 MED ORDER — PREDNISONE 10 MG PO TABS: ORAL_TABLET | ORAL | 0 refills | Status: DC

## 2019-08-15 MED ORDER — INSULIN ASPART 100 UNIT/ML ~~LOC~~ SOLN
0.0000 [IU] | Freq: Three times a day (TID) | SUBCUTANEOUS | Status: DC
  Administered 2019-08-15: 15 [IU] via SUBCUTANEOUS
  Administered 2019-08-16: 11 [IU] via SUBCUTANEOUS
  Administered 2019-08-16: 5 [IU] via SUBCUTANEOUS
  Administered 2019-08-16: 8 [IU] via SUBCUTANEOUS
  Administered 2019-08-17: 5 [IU] via SUBCUTANEOUS

## 2019-08-15 MED ORDER — DICLOFENAC SODIUM 1 % EX GEL: 4.0000 g | Freq: Four times a day (QID) | CUTANEOUS | 0 refills | Status: AC | PRN

## 2019-08-15 MED ORDER — INSULIN GLARGINE 100 UNIT/ML ~~LOC~~ SOLN
30.0000 [IU] | Freq: Every day | SUBCUTANEOUS | Status: DC
  Administered 2019-08-16 – 2019-08-17 (×2): 30 [IU] via SUBCUTANEOUS
  Filled 2019-08-15 (×3): qty 0.3

## 2019-08-15 MED ORDER — PREDNISONE 50 MG PO TABS
50.0000 mg | ORAL_TABLET | Freq: Every day | ORAL | Status: DC
  Administered 2019-08-16: 50 mg via ORAL
  Filled 2019-08-15 (×2): qty 1

## 2019-08-15 MED ORDER — INSULIN GLARGINE 100 UNIT/ML ~~LOC~~ SOLN
20.0000 [IU] | Freq: Every day | SUBCUTANEOUS | Status: DC
  Administered 2019-08-15: 20 [IU] via SUBCUTANEOUS
  Filled 2019-08-15: qty 0.2

## 2019-08-15 MED ORDER — HYDROCODONE-ACETAMINOPHEN 5-325 MG PO TABS: 1.0000 | ORAL_TABLET | Freq: Four times a day (QID) | ORAL | 0 refills | Status: AC | PRN

## 2019-08-15 NOTE — Progress Notes (Signed)
PROGRESS NOTE    Steve Brown  NFA:[id number] DOB: [birth year] DOA: 08/11/2019 PCP: Lawerance Cruel, MD    Brief Narrative:  Steve Brown is a 79 year old male with past medical history CAD status post PCI/DES 2010, insulin-dependent diabetes mellitus, history of CVA, OSA on CPAP, history of PE on Xarelto, CKD stage IIIb who presented to the ED with lower extremity swelling, abdominal swelling and chest pain.  In the ED, patient received aspirin 3 and 25 mg and sublingual nitroglycerin with relief of his chest pain. He was given Lasix 60 mg IV. Cardiology was consulted and EDP requested hospitalist admission for further evaluation and treatment.   Assessment & Plan:   Active Problems:   Diabetes mellitus type 2 with complications (HCC)   HTN (hypertension)   CAD in native artery   History of recurrent TIAs   History of pulmonary embolism: June 2014,  Takes Xarelto   OSA on CPAP   CKD (chronic kidney disease), stage II   Benign essential HTN   Sciatica associated with disorder of lumbar spine   Apnea, sleep   Nonspecific chest pain   Lumbar spinal stenosis   Chest pain Presenting to ED with 1 week history of worsening chest pain in the setting of known CAD with prior stenting LAD 2010.Troponin 12>12>12>11>12; flat.  Underwent Lexiscan Myoview stress test on 08/13/2019 without ischemia or infarction noted.  --Continues to be chest pain-free, not on beta-blocker secondary to intolerance given history of bradycardia --Resume Xarelto today --Cardiology signed off with outpatient follow-up scheduled  Acute on chronic diastolic congestive heart failure TTE 08/08/2019 with LVEF and no regional wall motion abnormalities. CT chest negative for PE or pulmonary edema. BNP 93. Was started on aggressive IV diuresis with furosemide 60 mg IV every 12 hours, now on hold given bump in creatinine. --net negative 1.7L since admission --Continue strict I's and O's and daily weights --Close  monitoring of creatinine --Continue hold furosemide  CKD stage IIIb Baseline and 1.2-1.4. --Cr 1.48>1.39>1.95>1.88>1.55 --Lasix currently on hold due to bump in creatinine --Continue to monitor renal function closely daily  History of pulmonary embolism, 2014 On Xarelto outpatient, currently being held for possible need of LHC. CT angiogram chest with no pulmonary embolism. Left lower extremity Doppler ultrasound negative for DVT. --Resumed home Xarelto  Insulin-dependent diabetes mellitus On Tresiba 26u [place] daily, and Humalog 6-10u [place] TIDAC ISS at home.  Hemoglobin A1c 7.5 on 08/12/2019. --Diabetic educator following, appreciate assistance --Lantus 20 units potentially daily --very sensitive insulin sliding scale for further coverage --CBGs every before every meal/at bedtime  Left leg pain Patient with history of chronic low back pain on oxycodone 20 mg every 4 hours as needed outpatient.  Complains of left leg pain with hip flexion that radiates down the posterior thigh.  Concern for possible sciatica.  CT L-spine notable for multilevel degenerative disc disease with severe stenosis L4/5. --Start prednisone 60 mg p.o. daily --Avoid NSAIDs in the setting of CKD --Continue home Vicodin --PT/OT following --Patient unable to navigate 16 steps to get to into his residence at home, will seek SNF placement   DVT prophylaxis: Lovenox Code Status: Full code Family Communication: No family present at bedside this morning  Disposition Plan:  Status is: Inpatient  Remains inpatient appropriate because:Ongoing diagnostic testing needed not appropriate for outpatient work up   Dispo: The patient is from: Home              Anticipated d/c is to: SNF  Anticipated d/c date is: 1 day              Patient currently is not medically stable to d/c.    Consultants:   Cardiology - signed off 8/5  Procedures:   MYOCARDIAL IMAGING WITH SPECT (REST AND  PHARMACOLOGIC-STRESS) GATED LEFT VENTRICULAR WALL MOTION STUDY LEFT VENTRICULAR EJECTION FRACTION  TECHNIQUE: Standard myocardial SPECT imaging was performed after resting intravenous injection of 10 mCi Tc-68m tetrofosmin. Subsequently, intravenous infusion of Lexiscan was performed under the supervision of the Cardiology staff. At peak effect of the drug, 30 mCi Tc-86m tetrofosmin was injected intravenously and standard myocardial SPECT imaging was performed. Quantitative gated imaging was also performed to evaluate left ventricular wall motion, and estimate left ventricular ejection fraction.  COMPARISON:  None.  FINDINGS: Perfusion: No decreased activity in the left ventricle on stress imaging to suggest reversible ischemia or infarction.  Wall Motion: Normal left ventricular wall motion. No left ventricular dilation.  Left Ventricular Ejection Fraction: 54 %  End diastolic volume 57 ml  End systolic volume 26 ml  IMPRESSION: 1. No reversible ischemia or infarction. 2. Normal left ventricular wall motion. 3. Left ventricular ejection fraction 54% 4. Non invasive risk stratification*: Low  Antimicrobials:   none   Subjective: Patient seen and examined at bedside, lying in bed.  Continues to complain of left leg pain with difficulty ambulating.  Was seen by PT yesterday with initial recommendations of home health; but patient unable to navigate 16 steps into his residence so requesting SNF placement.  No other complaints or concerns at this time.  Denies headache, no fever/chills/night sweats, no nausea/vomit/diarrhea, no chest pain, no palpitations, no shortness of breath, no abdominal pain. No acute events overnight per nursing staff.  Objective: Vitals:   08/14/19 2327 08/15/19 0328 08/15/19 0800 08/15/19 1200  BP: (!) 141/76 124/80 126/63 140/79  Pulse: 63   60  Resp: 14 14 15 14   Temp: 98.2 F (36.8 C) 98.5 F (36.9 C)    TempSrc: Oral Oral    SpO2:  94% 93% 95%   Weight:      Height:        Intake/Output Summary (Last 24 hours) at 08/15/2019 1228 Last data filed at 08/15/2019 0615 Gross per 24 hour  Intake --  Output 475 ml  Net -475 ml   Filed Weights   08/11/19 1631 08/12/19 1330  Weight: 94.8 kg 90.8 kg    Examination:  General exam: Appears calm and comfortable  Respiratory system: Clear to auscultation. Respiratory effort normal.  Oxygenating well on room air Cardiovascular system: S1 & S2 heard, RRR. No JVD, murmurs, rubs, gallops or clicks.  1+ pitting edema bilateral lower extremities Gastrointestinal system: Abdomen is nondistended, soft and nontender. No organomegaly or masses felt. Normal bowel sounds heard. Central nervous system: Alert and oriented. No focal neurological deficits. Extremities: Symmetric 5 x 5 power. Pain with left hip flexion Skin: No rashes, lesions or ulcers Psychiatry: Judgement and insight appear normal. Mood & affect appropriate.     Data Reviewed: I have personally reviewed following labs and imaging studies  CBC: Recent Labs  Lab 08/11/19 1730 08/12/19 0424 08/13/19 0110  WBC 5.4 5.0 5.0  NEUTROABS 2.7 2.7 2.3  HGB 13.3 13.5 12.9*  HCT 39.6 39.6 38.0*  MCV 94.3 94.3 93.1  PLT 199 220 299   Basic Metabolic Panel: Recent Labs  Lab 08/11/19 1730 08/12/19 0424 08/13/19 0110 08/14/19 0307 08/15/19 0805  NA 141 139 137 138 138  K 3.9 3.8 4.4 4.2 3.9  CL 103 99 96* 98 99  CO2 29 30 30 28 29   GLUCOSE 61* 176* 196* 248* 194*  BUN 18 16 30* 33* 32*  CREATININE 1.48* 1.39* 1.95* 1.88* 1.53*  CALCIUM 9.5 9.1 9.1 8.7* 9.0  MG  --  1.7  --   --   --   PHOS  --  4.0 4.8*  --   --    GFR: Estimated Creatinine Clearance: 41.9 mL/min (A) (by C-G formula based on SCr of 1.53 mg/dL (H)). Liver Function Tests: Recent Labs  Lab 08/11/19 1730 08/12/19 0424 08/13/19 0110  AST 21 19  --   ALT 19 19  --   ALKPHOS 65 62  --   BILITOT 0.9 1.1  --   PROT 6.7 6.5  --   ALBUMIN 3.6  3.4* 3.5   No results for input(s): LIPASE, AMYLASE in the last 168 hours. No results for input(s): AMMONIA in the last 168 hours. Coagulation Profile: No results for input(s): INR, PROTIME in the last 168 hours. Cardiac Enzymes: No results for input(s): CKTOTAL, CKMB, CKMBINDEX, TROPONINI in the last 168 hours. BNP (last 3 results) Recent Labs    08/06/19 1243  PROBNP 93   HbA1C: No results for input(s): HGBA1C in the last 72 hours. CBG: Recent Labs  Lab 08/14/19 2013 08/14/19 2328 08/15/19 0330 08/15/19 0734 08/15/19 1113  GLUCAP 336* 260* 211* 186* 319*   Lipid Profile: No results for input(s): CHOL, HDL, LDLCALC, TRIG, CHOLHDL, LDLDIRECT in the last 72 hours. Thyroid Function Tests: No results for input(s): TSH, T4TOTAL, FREET4, T3FREE, THYROIDAB in the last 72 hours. Anemia Panel: No results for input(s): VITAMINB12, FOLATE, FERRITIN, TIBC, IRON, RETICCTPCT in the last 72 hours. Sepsis Labs: No results for input(s): PROCALCITON, LATICACIDVEN in the last 168 hours.  Recent Results (from the past 240 hour(s))  SARS Coronavirus 2 by RT PCR (hospital order, performed in [facility] hospital lab) Nasopharyngeal Nasopharyngeal Swab     Status: None   Collection Time: 08/11/19  9:34 PM   Specimen: Nasopharyngeal Swab  Result Value Ref Range Status   SARS Coronavirus 2 NEGATIVE NEGATIVE Final    Comment: (NOTE) SARS-CoV-2 target nucleic acids are NOT DETECTED.  The SARS-CoV-2 RNA is generally detectable in upper and lower respiratory specimens during the acute phase of infection. The lowest concentration of SARS-CoV-2 viral copies this assay can detect is 250 copies / mL. A negative result does not preclude SARS-CoV-2 infection and should not be used as the sole basis for treatment or other patient management decisions.  A negative result may occur with improper specimen collection / handling, submission of specimen other than nasopharyngeal swab, presence of viral  mutation(s) within the areas targeted by this assay, and inadequate number of viral copies (<250 copies / mL). A negative result must be combined with clinical observations, patient history, and epidemiological information.  Fact Sheet for Patients:   StrictlyIdeas.no  Fact Sheet for Healthcare Providers: BankingDealers.co.za  This test is not yet approved or  cleared by the [place] FDA and has been authorized for detection and/or diagnosis of SARS-CoV-2 by FDA under an Emergency Use Authorization (EUA).  This EUA will remain in effect (meaning this test can be used) for the duration of the COVID-19 declaration under Section 564(b)(1) of the Act, 21 U.S.C. section 360bbb-3(b)(1), unless the authorization is terminated or revoked sooner.  Performed at [facility], [place] [address], [place], [place] [address]  MRSA PCR Screening     Status: None   Collection Time: 08/12/19  3:39 PM   Specimen: Nasal Mucosa; Nasopharyngeal  Result Value Ref Range Status   MRSA by PCR NEGATIVE NEGATIVE Final    Comment:        The GeneXpert MRSA Assay (FDA approved for NASAL specimens only), is one component of a comprehensive MRSA colonization surveillance program. It is not intended to diagnose MRSA infection nor to guide or monitor treatment for MRSA infections. Performed at [facility], [place] [address] [place], [place] [address]          Radiology Studies: CT LUMBAR SPINE WO CONTRAST  Result Date: 08/13/2019 CLINICAL DATA:  Low back pain EXAM: CT LUMBAR SPINE WITHOUT CONTRAST TECHNIQUE: Multidetector CT imaging of the lumbar spine was performed without intravenous contrast administration. Multiplanar CT image reconstructions were also generated. COMPARISON:  Radiography 11/12/2016 FINDINGS: Segmentation: 5 lumbar type vertebral bodies. Alignment: Normal Vertebrae: Old minor superior endplate depression at L4. No other  focal bone finding. No evidence of recent fracture. Paraspinal and other soft tissues: Aortic atherosclerosis. No other finding of note. Disc levels: T11-12: Disc degeneration with endplate osteophytes and mild bulging of the disc. No compressive canal or foraminal narrowing. T12-L1: Normal interspace. L1-2: Normal interspace. L2-3: Bulging of the disc more towards the right. No canal stenosis. Mild right foraminal narrowing, not definitely compressive. L3-4: Bulging of the disc. Mild facet and ligamentous prominence. Mild canal stenosis, not likely compressive. L4-5: Advanced bilateral facet arthropathy. No slippage in this position. Anterolisthesis would likely occur with standing or flexion. Circumferential protrusion of the disc. Severe stenosis that could cause neural compression on either or both sides. The appearance would be worse with standing or flexion. L5-S1: Endplate osteophytes and central disc protrusion. Mild facet degeneration and hypertrophy. No visible neural compression. Bridging osteophytes at both sacroiliac joints. IMPRESSION: No acute fracture.  Old mild superior endplate depression at L4. Severe multifactorial spinal stenosis at L4-5, there would likely worsen with standing or flexion. Advanced bilateral facet arthropathy with facet and ligamentous hypertrophy and circumferential protrusion of the disc. L2-3: Disc bulge with mild prominence on the right. Mild right foraminal narrowing, not definitely compressive. L3-4: Centrally predominant disc bulge. Mild spinal stenosis without clear neural compression. L5-S1: Small central disc protrusion. No distinct neural compression. Electronically Signed   By: Nelson Chimes M.D.   On: 08/13/2019 15:17   NM Myocar Multi W/Spect W/Wall Motion / EF  Result Date: 08/13/2019 CLINICAL DATA:  79 year old male with a history of cardiomyopathy EXAM: MYOCARDIAL IMAGING WITH SPECT (REST AND PHARMACOLOGIC-STRESS) GATED LEFT VENTRICULAR WALL MOTION STUDY LEFT  VENTRICULAR EJECTION FRACTION TECHNIQUE: Standard myocardial SPECT imaging was performed after resting intravenous injection of 10 mCi Tc-20m tetrofosmin. Subsequently, intravenous infusion of Lexiscan was performed under the supervision of the Cardiology staff. At peak effect of the drug, 30 mCi Tc-108m tetrofosmin was injected intravenously and standard myocardial SPECT imaging was performed. Quantitative gated imaging was also performed to evaluate left ventricular wall motion, and estimate left ventricular ejection fraction. COMPARISON:  None. FINDINGS: Perfusion: No decreased activity in the left ventricle on stress imaging to suggest reversible ischemia or infarction. Wall Motion: Normal left ventricular wall motion. No left ventricular dilation. Left Ventricular Ejection Fraction: 54 % End diastolic volume 57 ml End systolic volume 26 ml IMPRESSION: 1. No reversible ischemia or infarction. 2. Normal left ventricular wall motion. 3. Left ventricular ejection fraction 54% 4. Non invasive risk stratification*: Low *2012 Appropriate  Use Criteria for Coronary Revascularization Focused Update: J Am Coll Cardiol. 4536;46(8):032-122. http://[email or web address] Electronically Signed   By: Corrie Mckusick D.O.   On: 08/13/2019 16:09        Scheduled Meds: . bisacodyl  10 mg Rectal Once  . diclofenac Sodium  4 g Topical QID  . docusate sodium  100 mg Oral BID  . insulin aspart  0-6 Units Subcutaneous TID WC  . insulin aspart  2 Units Subcutaneous TID WC  . insulin glargine  20 Units Subcutaneous Daily  . polyethylene glycol  17 g Oral Daily  . predniSONE  60 mg Oral Q breakfast  . pregabalin  300 mg Oral BID  . rivaroxaban  20 mg Oral Q supper  . senna  1 tablet Oral Daily  . sodium chloride flush  3 mL Intravenous Q12H   Continuous Infusions:   LOS: 3 days    Time spent: 38 minutes spent on chart review, discussion with nursing staff, consultants, updating family  and interview/physical exam; more than 50% of that time was spent in counseling and/or coordination of care.    Nancy Manuele J [place] DO [facility] Available via Epic secure chat 7am-7pm After these hours, please refer to coverage provider listed on [email or web address] 08/15/2019, 12:28 PM

## 2019-08-15 NOTE — Care Management (Signed)
1212 08-15-19 Case Manager received consult for home health services. Case Manager spoke with patient and he now wants to go to a skilled nursing facility (SNF) due to the 16 steps. Patient states he lives in an apartment. Case Manager did reach out to Physical and Occupational Therapy for re-consult for SNF. MD and Clinical Social Worker aware of plan of care. Case Manager will continue to follow for additional transition of care needs. Bethena Roys, RN,BSN Case Manager

## 2019-08-15 NOTE — Discharge Summary (Addendum)
Physician Discharge Summary  Steve Brown EPP:[id number] DOB: 05-04-1940 DOA: 08/11/2019  PCP: Lawerance Cruel, MD  Admit date: 08/11/2019 Discharge date: 08/17/2019  Admitted From: Home Disposition: Home  Recommendations for Outpatient Follow-up:  1. Follow up with PCP in 1-2 weeks 2. Follow-up with cardiology, [name] as scheduled on 08/22/2019 at 11:20 AM 3. Consider outpatient referral to neurosurgery for lumbar spinal stenosis 4. Continue prednisone taper for sciatica 5. Recommend repeat BMP at next visit  Home Health: Physical therapy Equipment/Devices: Rolling walker  Discharge Condition: Stable CODE STATUS: Full code Diet recommendation: Heart healthy/consistent carbohydrate diet  History of present illness:  Steve Brown is a 79 year old male with past medical history CAD status post PCI/DES 2010, insulin-dependent diabetes mellitus, history of CVA, OSA on CPAP, history of PE on Xarelto, CKD stage IIIb who presented to the ED with lower extremity swelling, abdominal swelling and chest pain.  In the ED, patient received aspirin 3 and 25 mg and sublingual nitroglycerin with relief of his chest pain. He was given Lasix 60 mg IV. Cardiology was consulted and EDP requested hospitalist admission for further evaluation and treatment.  Hospital course:  Chest pain Presenting to ED with 1 week history of worsening chest pain in the setting of known CAD with prior stenting LAD 2010.Troponin 12>12>12>11>12; flat.  Underwent Lexiscan Myoview stress test on 08/13/2019 without ischemia or infarction noted.  Chest pain resolved during hospitalization and cardiology recommended resumption of his home Xarelto.  Patient has scheduled follow-up with cardiology on 08/22/2019.  Acute on chronic diastolic congestive heart failure TTE 08/08/2019 with LVEF and no regional wall motion abnormalities. CT chest negative for PE or pulmonary edema. BNP 93. Was started on aggressive IV diuresis  with furosemide 60 mg IV every 12 hours but creatinine trended up to 1.95 and diuretic was held.  Patient's creatinine trended down to 1.53 at time of discharge.  Will hold any further diuretic on discharge and patient has follow-up scheduled with his cardiologist on 08/22/2019.  CKD stage IIIb Baseline and 1.2-1.4.  Creatinine trended up to a high of 1.95 secondary to aggressive IV diuresis as above.  All diuretics were held and his creatinine trended down to 1.53 at time of discharge.  Recommend repeat BMP in the next PCP/cardiology visit.  History of pulmonary embolism, 2014 On Xarelto outpatient, currently being held for possible need of LHC. CT angiogram chest with no pulmonary embolism. Left lower extremity Doppler ultrasound negative for DVT. Resume home Xarelto.  Insulin-dependent diabetes mellitus On Tresiba 26u [place] daily, and Humalog 6-10u Stephens TIDAC ISS at home.  Hemoglobin A1c 7.5 on 08/12/2019.  Left leg pain likely secondary to sciatica Lumbar spinal stenosis Patient with history of chronic low back pain. Complains of left leg pain with hip flexion that radiates down the posterior thigh.  Concern for possible sciatica.  CT L-spine notable for multilevel degenerative disc disease with severe stenosis L4/5. Discharging with PT.  Avoiding NSAIDs in the setting of CKD.  Continue prednisone taper.  Recommend outpatient referral to neurosurgery for further evaluation of his lumbar spinal stenosis.   Discharge Diagnoses:  Active Problems:   Diabetes mellitus type 2 with complications (HCC)   HTN (hypertension)   CAD in native artery   History of recurrent TIAs   History of pulmonary embolism: June 2014,  Takes Xarelto   OSA on CPAP   CKD (chronic kidney disease), stage II   Benign essential HTN   Sciatica associated with disorder of lumbar spine  Apnea, sleep   Nonspecific chest pain   Lumbar spinal stenosis    Discharge Instructions  Discharge Instructions    Call MD for:   difficulty breathing, headache or visual disturbances   Complete by: As directed    Call MD for:  difficulty breathing, headache or visual disturbances   Complete by: As directed    Call MD for:  difficulty breathing, headache or visual disturbances   Complete by: As directed    Call MD for:  extreme fatigue   Complete by: As directed    Call MD for:  extreme fatigue   Complete by: As directed    Call MD for:  extreme fatigue   Complete by: As directed    Call MD for:  persistant dizziness or light-headedness   Complete by: As directed    Call MD for:  persistant dizziness or light-headedness   Complete by: As directed    Call MD for:  persistant dizziness or light-headedness   Complete by: As directed    Call MD for:  persistant nausea and vomiting   Complete by: As directed    Call MD for:  persistant nausea and vomiting   Complete by: As directed    Call MD for:  persistant nausea and vomiting   Complete by: As directed    Call MD for:  severe uncontrolled pain   Complete by: As directed    Call MD for:  severe uncontrolled pain   Complete by: As directed    Call MD for:  severe uncontrolled pain   Complete by: As directed    Call MD for:  temperature >100.4   Complete by: As directed    Call MD for:  temperature >100.4   Complete by: As directed    Call MD for:  temperature >100.4   Complete by: As directed    Diet - low sodium heart healthy   Complete by: As directed    Increase activity slowly   Complete by: As directed    Increase activity slowly   Complete by: As directed    Increase activity slowly   Complete by: As directed      Allergies as of 08/17/2019      Reactions   Other Other (See Comments)   Per patient- cardiac cath dye-  "woke up during procedure hysterical."   Phenergan [promethazine] Other (See Comments)   Mood changes    Tramadol       Medication List    STOP taking these medications   diclofenac sodium 1 % Gel Commonly known as:  VOLTAREN Replaced by: diclofenac Sodium 1 % Gel   gabapentin 100 MG capsule Commonly known as: Neurontin   potassium chloride 10 MEQ tablet Commonly known as: KLOR-CON     TAKE these medications   acetaminophen 500 MG tablet Commonly known as: TYLENOL Take 1,000 mg by mouth every 6 (six) hours as needed (pain).   cholecalciferol 1000 units tablet Commonly known as: VITAMIN D Take 1,000 Units by mouth daily.   diclofenac Sodium 1 % Gel Commonly known as: VOLTAREN Apply 4 g topically 4 (four) times daily as needed for up to 10 days. Replaces: diclofenac sodium 1 % Gel   Echinacea 450 MG Caps Take 450 mg by mouth daily.   fluticasone 50 MCG/ACT nasal spray Commonly known as: FLONASE Place 1 spray into both nostrils 2 (two) times daily as needed for allergies.   furosemide 20 MG tablet Commonly known as: LASIX Take 1-2  tablets daily as needed for swelling. What changed:   how much to take  how to take this  when to take this  additional instructions   HumaLOG KwikPen 100 UNIT/ML KiwkPen Generic drug: insulin lispro Inject 6-10 Units into the skin 3 (three) times daily with meals.   HYDROcodone-acetaminophen 5-325 MG tablet Commonly known as: NORCO/VICODIN Take 1 tablet by mouth every 6 (six) hours as needed for up to 5 days for severe pain.   losartan 100 MG tablet Commonly known as: COZAAR Take 100 mg by mouth daily.   multivitamin with minerals Tabs tablet Take 1 tablet by mouth daily.   nitroGLYCERIN 0.4 MG SL tablet Commonly known as: NITROSTAT Place 1 tablet (0.4 mg total) under the tongue every 5 (five) minutes as needed for chest pain.   predniSONE 10 MG tablet Commonly known as: DELTASONE Take 4 tablets (40 mg total) by mouth daily with breakfast for 3 days, THEN 2 tablets (20 mg total) daily with breakfast for 3 days, THEN 1 tablet (10 mg total) daily with breakfast for 3 days. Start taking on: August 18, 2019   pregabalin 300 MG  capsule Commonly known as: Lyrica Take 1 capsule (300 mg total) by mouth 2 (two) times daily.   rivaroxaban 20 MG Tabs tablet Commonly known as: XARELTO Take 20 mg by mouth daily.   Tyler Aas FlexTouch 100 UNIT/ML FlexTouch Pen Generic drug: insulin degludec Inject 26 Units into the skin every morning.   valACYclovir 500 MG tablet Commonly known as: VALTREX Take 500 mg by mouth daily.   VITAMIN C PO Take 1 tablet by mouth daily.   zinc gluconate 50 MG tablet Take 50 mg by mouth daily.            Durable Medical Equipment  (From admission, onward)         Start     Ordered   08/17/19 0922  For home use only DME 3 n 1  Once        08/17/19 0921   08/15/19 0650  For home use only DME Walker rolling  Once       Question Answer Comment  Walker: With 5 Inch Wheels   Patient needs a walker to treat with the following condition Gait disturbance      08/15/19 0650          Follow-up Information    Jettie Booze, MD Follow up on 08/22/2019.   Specialties: Cardiology, Radiology, Interventional Cardiology Why: 11:20AM. Cardiology follow up Contact information: [address] [place] [address] [phone]        Lawerance Cruel, MD. Schedule an appointment as soon as possible for a visit in 1 week(s).   Specialty: Family Medicine Contact information: [address] [place] [address] [phone]              Allergies  Allergen Reactions  . Other Other (See Comments)    Per patient- cardiac cath dye-  "woke up during procedure hysterical."  . Phenergan [Promethazine] Other (See Comments)    Mood changes   . Tramadol     Consultations:  Cardiology   Procedures/Studies: DG Chest 2 View  Result Date: 08/11/2019 CLINICAL DATA:  79 year old male with chest pain. EXAM: CHEST - 2 VIEW COMPARISON:  Chest radiograph dated 01/12/2019. FINDINGS: Patchy area of airspace density at the left lung base which may represent  atelectasis or infiltrate. Clinical correlation and follow-up to resolution recommended. There is no  pleural effusion or pneumothorax. Mild cardiomegaly. Atherosclerotic calcification of the aorta. Degenerative changes of the spine. No acute osseous pathology. Lower cervical ACDF. Cholecystectomy clips. IMPRESSION: Left lung base density concerning for pneumonia. Clinical correlation and follow-up to resolution recommended. Electronically Signed   By: Anner Crete M.D.   On: 08/11/2019 17:16   DG Abd 1 View  Result Date: 08/12/2019 CLINICAL DATA:  Abdomen distension EXAM: ABDOMEN - 1 VIEW COMPARISON:  None. FINDINGS: Clips in the right upper quadrant. Mild diffuse increased small and large bowel gas without distention. Moderate stool in the colon. Probable phleboliths in the pelvis. IMPRESSION: Mild diffuse increased small and large bowel gas without distention, possible ileus. Moderate stool in the colon. Electronically Signed   By: Donavan Foil M.D.   On: 08/12/2019 00:34   CT Angio Chest PE W and/or Wo Contrast  Result Date: 08/11/2019 CLINICAL DATA:  Positive D-dimer, PE suspected, history of prior pulmonary embolism EXAM: CT ANGIOGRAPHY CHEST WITH CONTRAST TECHNIQUE: Multidetector CT imaging of the chest was performed using the standard protocol during bolus administration of intravenous contrast. Multiplanar CT image reconstructions and MIPs were obtained to evaluate the vascular anatomy. CONTRAST:  37mL OMNIPAQUE IOHEXOL 350 MG/ML SOLN COMPARISON:  CT PA 06/30/2012 FINDINGS: Cardiovascular: Satisfactory opacification of the pulmonary arteries to the segmental level. No evidence of acute or residual chronic pulmonary artery emboli. Mild dilatation of the pulmonary trunk. Cardiac size within normal limits. No pericardial effusion. Three-vessel coronary artery disease is noted. Atherosclerotic plaque within the normal caliber aorta. Suboptimal opacification of the aorta for luminal evaluation. No  acute periaortic abnormality. Shared origin of the brachiocephalic and left common carotid artery with minimal plaque in the proximal great vessels. No major venous abnormalities. Mediastinum/Nodes: No mediastinal fluid or gas. Normal thyroid gland and thoracic inlet. No acute abnormality of the trachea. Small hiatal hernia without other acute abnormality of thoracic esophagus. No worrisome mediastinal, hilar or axillary adenopathy. Lungs/Pleura: Atelectatic changes are present throughout the lungs, likely accentuated by imaging during exhalation for the angiographic technique. There is some mild diffuse airways thickening however with scattered secretions. No consolidation, features of edema, pneumothorax, or effusion. No suspicious pulmonary nodules or masses. Upper Abdomen: Prior cholecystectomy. Mild nonspecific bilateral perinephric stranding. Can be seen with advanced age/senescent change. Musculoskeletal: Multilevel degenerative changes are present in the imaged portions of the spine. Multilevel flowing anterior osteophytosis, compatible with features of diffuse idiopathic skeletal hyperostosis (DISH). Additional degenerative changes in the bilateral shoulders. No acute or worrisome osseous lesions. No suspicious chest wall lesions. Review of the MIP images confirms the above findings. IMPRESSION: 1. No evidence of acute or chronic residual pulmonary artery emboli. 2. Mild dilatation of the pulmonary trunk, can be seen with pulmonary arterial hypertension. 3. Atelectatic changes throughout the lungs, likely accentuated by imaging during exhalation for the angiographic technique. 4. Mild diffuse airways thickening may reflect bronchitic change either acute or chronic. 5. Coronary artery calcifications are present. Please note that the presence of coronary artery calcium documents the presence of coronary artery disease, the severity of this disease and any potential stenosis cannot be assessed on this  non-gated CT examination. 6. Aortic Atherosclerosis (ICD10-I70.0). Electronically Signed   By: Lovena Le M.D.   On: 08/11/2019 19:44   CT LUMBAR SPINE WO CONTRAST  Result Date: 08/13/2019 CLINICAL DATA:  Low back pain EXAM: CT LUMBAR SPINE WITHOUT CONTRAST TECHNIQUE: Multidetector CT imaging of the lumbar spine was performed without intravenous contrast administration. Multiplanar CT image reconstructions were also  generated. COMPARISON:  Radiography 11/12/2016 FINDINGS: Segmentation: 5 lumbar type vertebral bodies. Alignment: Normal Vertebrae: Old minor superior endplate depression at L4. No other focal bone finding. No evidence of recent fracture. Paraspinal and other soft tissues: Aortic atherosclerosis. No other finding of note. Disc levels: T11-12: Disc degeneration with endplate osteophytes and mild bulging of the disc. No compressive canal or foraminal narrowing. T12-L1: Normal interspace. L1-2: Normal interspace. L2-3: Bulging of the disc more towards the right. No canal stenosis. Mild right foraminal narrowing, not definitely compressive. L3-4: Bulging of the disc. Mild facet and ligamentous prominence. Mild canal stenosis, not likely compressive. L4-5: Advanced bilateral facet arthropathy. No slippage in this position. Anterolisthesis would likely occur with standing or flexion. Circumferential protrusion of the disc. Severe stenosis that could cause neural compression on either or both sides. The appearance would be worse with standing or flexion. L5-S1: Endplate osteophytes and central disc protrusion. Mild facet degeneration and hypertrophy. No visible neural compression. Bridging osteophytes at both sacroiliac joints. IMPRESSION: No acute fracture.  Old mild superior endplate depression at L4. Severe multifactorial spinal stenosis at L4-5, there would likely worsen with standing or flexion. Advanced bilateral facet arthropathy with facet and ligamentous hypertrophy and circumferential protrusion  of the disc. L2-3: Disc bulge with mild prominence on the right. Mild right foraminal narrowing, not definitely compressive. L3-4: Centrally predominant disc bulge. Mild spinal stenosis without clear neural compression. L5-S1: Small central disc protrusion. No distinct neural compression. Electronically Signed   By: Nelson Chimes M.D.   On: 08/13/2019 15:17   NM Myocar Multi W/Spect W/Wall Motion / EF  Result Date: 08/13/2019 CLINICAL DATA:  79 year old male with a history of cardiomyopathy EXAM: MYOCARDIAL IMAGING WITH SPECT (REST AND PHARMACOLOGIC-STRESS) GATED LEFT VENTRICULAR WALL MOTION STUDY LEFT VENTRICULAR EJECTION FRACTION TECHNIQUE: Standard myocardial SPECT imaging was performed after resting intravenous injection of 10 mCi Tc-59m tetrofosmin. Subsequently, intravenous infusion of Lexiscan was performed under the supervision of the Cardiology staff. At peak effect of the drug, 30 mCi Tc-19m tetrofosmin was injected intravenously and standard myocardial SPECT imaging was performed. Quantitative gated imaging was also performed to evaluate left ventricular wall motion, and estimate left ventricular ejection fraction. COMPARISON:  None. FINDINGS: Perfusion: No decreased activity in the left ventricle on stress imaging to suggest reversible ischemia or infarction. Wall Motion: Normal left ventricular wall motion. No left ventricular dilation. Left Ventricular Ejection Fraction: 54 % End diastolic volume 57 ml End systolic volume 26 ml IMPRESSION: 1. No reversible ischemia or infarction. 2. Normal left ventricular wall motion. 3. Left ventricular ejection fraction 54% 4. Non invasive risk stratification*: Low *2012 Appropriate Use Criteria for Coronary Revascularization Focused Update: J Am Coll Cardiol. 5830;94(0):768-088. http://content.airportbarriers.com.aspx?articleid=[email or web address] Electronically Signed   By: Corrie Mckusick D.O.   On: 08/13/2019 16:09   ECHOCARDIOGRAM COMPLETE  Result Date: 08/08/2019     ECHOCARDIOGRAM REPORT   Patient Name:   Steve Brown Date of Exam: 08/08/2019 Medical Rec #:  [id number]        Height:       65.0 in Accession #:    [id number]       Weight:       205.0 lb Date of Birth:  Apr 27, 1940         BSA:          1.999 m Patient Age:    88 years         BP:           136/76 mmHg Patient Gender: M  HR:           65 bpm. Exam Location:  [facility] Procedure: 2D Echo, 3D Echo, Cardiac Doppler, Color Doppler and Strain Analysis Indications:    R06.00 Dyspnea on exertion  History:        Patient has prior history of Echocardiogram examinations, most                 recent 04/27/2016. CAD, TIA and Stroke, Signs/Symptoms:Syncope                 and Orthostatic hypotension.; Risk Factors:Hypertension, Sleep                 Apnea, Diabetes and Dyslipidemia. CKD stage 2. Anemia.  Sonographer:    Jessee Avers, RDCS Referring Phys: Lindsay  1. Normal GLS -17.5. Left ventricular ejection fraction, by estimation, is 60 to 65%. The left ventricle has normal function. The left ventricle has no regional wall motion abnormalities. Left ventricular diastolic parameters were normal.  2. Right ventricular systolic function is normal. The right ventricular size is normal. There is mildly elevated pulmonary artery systolic pressure.  3. The mitral valve is normal in structure. Trivial mitral valve regurgitation. No evidence of mitral stenosis.  4. The aortic valve is tricuspid. Aortic valve regurgitation is trivial. Mild aortic valve sclerosis is present, with no evidence of aortic valve stenosis.  5. Mild pulmonic stenosis.  6. The inferior vena cava is normal in size with greater than 50% respiratory variability, suggesting right atrial pressure of 3 mmHg. Comparison(s): 04/27/16 EF 60-65%. GLS -18.4%. PA pressure 48mmHg. FINDINGS  Left Ventricle: Normal GLS -17.5. Left ventricular ejection fraction, by estimation, is 60 to 65%. The left ventricle has normal  function. The left ventricle has no regional wall motion abnormalities. The left ventricular internal cavity size was normal  in size. There is no left ventricular hypertrophy. Left ventricular diastolic parameters were normal. Right Ventricle: The right ventricular size is normal. No increase in right ventricular wall thickness. Right ventricular systolic function is normal. There is mildly elevated pulmonary artery systolic pressure. The tricuspid regurgitant velocity is 2.87  m/s, and with an assumed right atrial pressure of 8 mmHg, the estimated right ventricular systolic pressure is 03.5 mmHg. Left Atrium: Left atrial size was normal in size. Right Atrium: Right atrial size was normal in size. Pericardium: There is no evidence of pericardial effusion. Mitral Valve: The mitral valve is normal in structure. Normal mobility of the mitral valve leaflets. Trivial mitral valve regurgitation. No evidence of mitral valve stenosis. Tricuspid Valve: The tricuspid valve is normal in structure. Tricuspid valve regurgitation is mild . No evidence of tricuspid stenosis. Aortic Valve: The aortic valve is tricuspid. Aortic valve regurgitation is trivial. Aortic regurgitation PHT measures 508 msec. Mild aortic valve sclerosis is present, with no evidence of aortic valve stenosis. Pulmonic Valve: The pulmonic valve was normal in structure. Pulmonic valve regurgitation is not visualized. Mild pulmonic stenosis. Aorta: The aortic root is normal in size and structure. Venous: The inferior vena cava is normal in size with greater than 50% respiratory variability, suggesting right atrial pressure of 3 mmHg. IAS/Shunts: No atrial level shunt detected by color flow Doppler.  LEFT VENTRICLE PLAX 2D LVIDd:         5.00 cm  Diastology LVIDs:         2.80 cm  LV e' lateral:   7.38 cm/s LV PW:  1.10 cm  LV E/e' lateral: 10.8 LV IVS:        1.10 cm  LV e' medial:    5.27 cm/s LVOT diam:     2.00 cm  LV E/e' medial:  15.1 LV SV:          80 LV SV Index:   40       2D Longitudinal Strain LVOT Area:     3.14 cm 2D Strain GLS (A2C):   -18.8 %                         2D Strain GLS (A3C):   -17.2 %                         2D Strain GLS (A4C):   -16.5 %                         2D Strain GLS Avg:     -17.5 %                          3D Volume EF:                         3D EF:        56 %                         LV EDV:       145 ml                         LV ESV:       64 ml                         LV SV:        81 ml RIGHT VENTRICLE RV Basal diam:  3.20 cm RV S prime:     12.70 cm/s TAPSE (M-mode): 2.3 cm RVSP:           40.9 mmHg LEFT ATRIUM             Index       RIGHT ATRIUM           Index LA diam:        3.50 cm 1.75 cm/m  RA Pressure: 8.00 mmHg LA Vol (A2C):   48.2 ml 24.11 ml/m RA Area:     15.20 cm LA Vol (A4C):   41.0 ml 20.51 ml/m RA Volume:   36.00 ml  18.01 ml/m LA Biplane Vol: 45.8 ml 22.91 ml/m  AORTIC VALVE LVOT Vmax:   120.00 cm/s LVOT Vmean:  76.100 cm/s LVOT VTI:    0.254 m AI PHT:      508 msec  AORTA Ao Root diam: 3.70 cm Ao Asc diam:  3.60 cm MITRAL VALVE               TRICUSPID VALVE                            TR Peak grad:   32.9 mmHg                            TR Vmax:  287.00 cm/s MV E velocity: 79.70 cm/s  Estimated RAP:  8.00 mmHg MV A velocity: 72.00 cm/s  RVSP:           40.9 mmHg MV E/A ratio:  1.11                            SHUNTS                            Systemic VTI:  0.25 m                            Systemic Diam: 2.00 cm Jenkins Rouge MD Electronically signed by Jenkins Rouge MD Signature Date/Time: 08/08/2019/4:05:24 PM    Final    VAS [place] LOWER EXTREMITY VENOUS (DVT)  Result Date: 08/12/2019  Lower Venous DVTStudy Indications: Edema, and SOB.  Comparison Study: Prior study 07-01-2012 LEV bilateral negative for DVT Performing Technologist: Darlin Coco  Examination Guidelines: A complete evaluation includes B-mode imaging, spectral Doppler, color Doppler, and power Doppler as needed of all accessible  portions of each vessel. Bilateral testing is considered an integral part of a complete examination. Limited examinations for reoccurring indications may be performed as noted. The reflux portion of the exam is performed with the patient in reverse Trendelenburg.  +-----+---------------+---------+-----------+----------+--------------+ RIGHTCompressibilityPhasicitySpontaneityPropertiesThrombus Aging +-----+---------------+---------+-----------+----------+--------------+ CFV  Full           Yes      Yes                                 +-----+---------------+---------+-----------+----------+--------------+   +---------+---------------+---------+-----------+----------+--------------+ LEFT     CompressibilityPhasicitySpontaneityPropertiesThrombus Aging +---------+---------------+---------+-----------+----------+--------------+ CFV      Full           Yes      Yes                                 +---------+---------------+---------+-----------+----------+--------------+ SFJ      Partial                                                     +---------+---------------+---------+-----------+----------+--------------+ FV Prox  Full                                                        +---------+---------------+---------+-----------+----------+--------------+ FV Mid   Full                                                        +---------+---------------+---------+-----------+----------+--------------+ FV DistalFull                                                        +---------+---------------+---------+-----------+----------+--------------+  PFV      Full                                                        +---------+---------------+---------+-----------+----------+--------------+ POP      Full           Yes      Yes                                 +---------+---------------+---------+-----------+----------+--------------+ PTV      Full                                                         +---------+---------------+---------+-----------+----------+--------------+ PERO     Full                                                        +---------+---------------+---------+-----------+----------+--------------+     Summary: RIGHT: - No evidence of common femoral vein obstruction.  LEFT: - There is no evidence of deep vein thrombosis in the lower extremity.  - No cystic structure found in the popliteal fossa.  *See table(s) above for measurements and observations. Electronically signed by Deitra Mayo MD on 08/12/2019 at 11:03:33 AM.    Final      Subjective: Patient seen and examined bedside, resting comfortably.  Continues with some mild left lower leg pain starting at gluteal region down posterior leg, improved on prednisone.  Seen by physical therapy with recommendations of home health PT.  Cardiology has signed off with negative Myoview Lexiscan stress test.  No other questions or concerns at this time.  Denies headache, no visual changes, no chest pain, no palpitations, no shortness of breath, no abdominal pain.  No acute events overnight per nursing staff.  Discharge Exam: Vitals:   08/17/19 0357 08/17/19 0725  BP: (!) 157/84 (!) 147/87  Pulse: (!) 52 (!) 59  Resp: 13 (!) 22  Temp: 97.6 F (36.4 C) 98.1 F (36.7 C)  SpO2: 99% 98%   Vitals:   08/16/19 1956 08/16/19 2301 08/17/19 0357 08/17/19 0725  BP: 138/69 (!) 147/77 (!) 157/84 (!) 147/87  Pulse: (!) 55 85 (!) 52 (!) 59  Resp: 15 13 13  (!) 22  Temp: 98.4 F (36.9 C) 98.2 F (36.8 C) 97.6 F (36.4 C) 98.1 F (36.7 C)  TempSrc: Oral Oral Axillary Oral  SpO2: 95% 97% 99% 98%  Weight:      Height:        General: Pt is alert, awake, not in acute distress Cardiovascular: RRR, S1/S2 +, no rubs, no gallops Respiratory: CTA bilaterally, no wheezing, no rhonchi Abdominal: Soft, NT, ND, bowel sounds + Extremities: no edema, no cyanosis    The results of  significant diagnostics from this hospitalization (including imaging, microbiology, ancillary and laboratory) are listed below for reference.     Microbiology: Recent Results (from the past 240 hour(s))  SARS Coronavirus 2 by RT PCR (hospital  order, performed in [facility] hospital lab) Nasopharyngeal Nasopharyngeal Swab     Status: None   Collection Time: 08/11/19  9:34 PM   Specimen: Nasopharyngeal Swab  Result Value Ref Range Status   SARS Coronavirus 2 NEGATIVE NEGATIVE Final    Comment: (NOTE) SARS-CoV-2 target nucleic acids are NOT DETECTED.  The SARS-CoV-2 RNA is generally detectable in upper and lower respiratory specimens during the acute phase of infection. The lowest concentration of SARS-CoV-2 viral copies this assay can detect is 250 copies / mL. A negative result does not preclude SARS-CoV-2 infection and should not be used as the sole basis for treatment or other patient management decisions.  A negative result may occur with improper specimen collection / handling, submission of specimen other than nasopharyngeal swab, presence of viral mutation(s) within the areas targeted by this assay, and inadequate number of viral copies (<250 copies / mL). A negative result must be combined with clinical observations, patient history, and epidemiological information.  Fact Sheet for Patients:   StrictlyIdeas.no  Fact Sheet for Healthcare Providers: BankingDealers.co.za  This test is not yet approved or  cleared by the [place] FDA and has been authorized for detection and/or diagnosis of SARS-CoV-2 by FDA under an Emergency Use Authorization (EUA).  This EUA will remain in effect (meaning this test can be used) for the duration of the COVID-19 declaration under Section 564(b)(1) of the Act, 21 U.S.C. section 360bbb-3(b)(1), unless the authorization is terminated or revoked sooner.  Performed at [facility],  [place] [address], [place], [place] [address]   MRSA PCR Screening     Status: None   Collection Time: 08/12/19  3:39 PM   Specimen: Nasal Mucosa; Nasopharyngeal  Result Value Ref Range Status   MRSA by PCR NEGATIVE NEGATIVE Final    Comment:        The GeneXpert MRSA Assay (FDA approved for NASAL specimens only), is one component of a comprehensive MRSA colonization surveillance program. It is not intended to diagnose MRSA infection nor to guide or monitor treatment for MRSA infections. Performed at [facility], [place] [address], [place], [place] [address]      Labs: BNP (last 3 results) Recent Labs    01/12/19 1523 08/11/19 1730  BNP 53.7 30.1   Basic Metabolic Panel: Recent Labs  Lab 08/12/19 0424 08/13/19 0110 08/14/19 0307 08/15/19 0805 08/16/19 0422  NA 139 137 138 138 137  K 3.8 4.4 4.2 3.9 4.5  CL 99 96* 98 99 99  CO2 30 30 28 29 28   GLUCOSE 176* 196* 248* 194* 327*  BUN 16 30* 33* 32* 36*  CREATININE 1.39* 1.95* 1.88* 1.53* 1.50*  CALCIUM 9.1 9.1 8.7* 9.0 9.0  MG 1.7  --   --   --   --   PHOS 4.0 4.8*  --   --   --    Liver Function Tests: Recent Labs  Lab 08/11/19 1730 08/12/19 0424 08/13/19 0110  AST 21 19  --   ALT 19 19  --   ALKPHOS 65 62  --   BILITOT 0.9 1.1  --   PROT 6.7 6.5  --   ALBUMIN 3.6 3.4* 3.5   No results for input(s): LIPASE, AMYLASE in the last 168 hours. No results for input(s): AMMONIA in the last 168 hours. CBC: Recent Labs  Lab 08/11/19 1730 08/12/19 0424 08/13/19 0110  WBC 5.4 5.0 5.0  NEUTROABS 2.7 2.7 2.3  HGB 13.3 13.5 12.9*  HCT 39.6 39.6 38.0*  MCV 94.3 94.3 93.1  PLT 199 220 222   Cardiac Enzymes: No results for input(s): CKTOTAL, CKMB, CKMBINDEX, TROPONINI in the last 168 hours. BNP: Invalid input(s): POCBNP CBG: Recent Labs  Lab 08/16/19 0619 08/16/19 1136 08/16/19 1545 08/16/19 2127 08/17/19 0638  GLUCAP 301* 241* 294* 270* 170*   D-Dimer No results for input(s): DDIMER in the last 72  hours. Hgb A1c No results for input(s): HGBA1C in the last 72 hours. Lipid Profile No results for input(s): CHOL, HDL, LDLCALC, TRIG, CHOLHDL, LDLDIRECT in the last 72 hours. Thyroid function studies No results for input(s): TSH, T4TOTAL, T3FREE, THYROIDAB in the last 72 hours.  Invalid input(s): FREET3 Anemia work up No results for input(s): VITAMINB12, FOLATE, FERRITIN, TIBC, IRON, RETICCTPCT in the last 72 hours. Urinalysis    Component Value Date/Time   COLORURINE YELLOW 03/20/2019 1920   APPEARANCEUR HAZY (A) 03/20/2019 1920   LABSPEC <1.005 (L) 03/20/2019 1920   LABSPEC 1.015 12/08/2016 1408   PHURINE 6.0 03/20/2019 1920   GLUCOSEU NEGATIVE 03/20/2019 1920   GLUCOSEU Negative 12/08/2016 1408   HGBUR MODERATE (A) 03/20/2019 1920   BILIRUBINUR NEGATIVE 03/20/2019 1920   BILIRUBINUR Negative 12/08/2016 1408   KETONESUR NEGATIVE 03/20/2019 1920   PROTEINUR NEGATIVE 03/20/2019 1920   UROBILINOGEN 0.2 12/08/2016 1408   NITRITE NEGATIVE 03/20/2019 1920   LEUKOCYTESUR SMALL (A) 03/20/2019 1920   LEUKOCYTESUR Moderate 12/08/2016 1408   Sepsis Labs Invalid input(s): PROCALCITONIN,  WBC,  LACTICIDVEN Microbiology Recent Results (from the past 240 hour(s))  SARS Coronavirus 2 by RT PCR (hospital order, performed in [facility] hospital lab) Nasopharyngeal Nasopharyngeal Swab     Status: None   Collection Time: 08/11/19  9:34 PM   Specimen: Nasopharyngeal Swab  Result Value Ref Range Status   SARS Coronavirus 2 NEGATIVE NEGATIVE Final    Comment: (NOTE) SARS-CoV-2 target nucleic acids are NOT DETECTED.  The SARS-CoV-2 RNA is generally detectable in upper and lower respiratory specimens during the acute phase of infection. The lowest concentration of SARS-CoV-2 viral copies this assay can detect is 250 copies / mL. A negative result does not preclude SARS-CoV-2 infection and should not be used as the sole basis for treatment or other patient management decisions.  A negative  result may occur with improper specimen collection / handling, submission of specimen other than nasopharyngeal swab, presence of viral mutation(s) within the areas targeted by this assay, and inadequate number of viral copies (<250 copies / mL). A negative result must be combined with clinical observations, patient history, and epidemiological information.  Fact Sheet for Patients:   StrictlyIdeas.no  Fact Sheet for Healthcare Providers: BankingDealers.co.za  This test is not yet approved or  cleared by the [place] FDA and has been authorized for detection and/or diagnosis of SARS-CoV-2 by FDA under an Emergency Use Authorization (EUA).  This EUA will remain in effect (meaning this test can be used) for the duration of the COVID-19 declaration under Section 564(b)(1) of the Act, 21 U.S.C. section 360bbb-3(b)(1), unless the authorization is terminated or revoked sooner.  Performed at [facility], [place] [address] [place], [place] [address]   MRSA PCR Screening     Status: None   Collection Time: 08/12/19  3:39 PM   Specimen: Nasal Mucosa; Nasopharyngeal  Result Value Ref Range Status   MRSA by PCR NEGATIVE NEGATIVE Final    Comment:        The GeneXpert MRSA Assay (FDA approved for NASAL specimens  only), is one component of a comprehensive MRSA colonization surveillance program. It is not intended to diagnose MRSA infection nor to guide or monitor treatment for MRSA infections. Performed at [facility], [place] [address], [place], [place] [address]      Time coordinating discharge: Over 30 minutes  SIGNED:   Sohum Delillo J [place] DO  [facility] 08/17/2019, 9:29 AM

## 2019-08-15 NOTE — Plan of Care (Signed)
  Problem: Education: Goal: Knowledge of General Education information will improve Description: Including pain rating scale, medication(s)/side effects and non-pharmacologic comfort measures Outcome: Progressing   Problem: Health Behavior/Discharge Planning: Goal: Ability to manage health-related needs will improve Outcome: Progressing   Problem: Clinical Measurements: Goal: Will remain free from infection Outcome: Progressing Goal: Diagnostic test results will improve Outcome: Progressing Goal: Respiratory complications will improve Outcome: Progressing Goal: Cardiovascular complication will be avoided Outcome: Progressing   Problem: Activity: Goal: Risk for activity intolerance will decrease Outcome: Progressing   Problem: Nutrition: Goal: Adequate nutrition will be maintained Outcome: Progressing   Problem: Coping: Goal: Level of anxiety will decrease Outcome: Progressing   Problem: Safety: Goal: Ability to remain free from injury will improve Outcome: Progressing

## 2019-08-15 NOTE — Progress Notes (Signed)
Physical Therapy Treatment Patient Details Name: Steve Brown MRN: [id number] DOB: 1940/06/17 Today's Date: 08/15/2019    History of Present Illness Pt adm with chest pain and found to have acute on chronic diastolic heart failure. PMH - CAD, DM, CVA, CKD, PE, chronic back pain    PT Comments    Pt now reports he doesn't have adequate help to get into home or once he gets home and he is now agreeable to ST-SNF.    Follow Up Recommendations  SNF     Equipment Recommendations  Rolling walker with 5" wheels    Recommendations for Other Services       Precautions / Restrictions Precautions Precautions: Fall    Mobility  Bed Mobility               General bed mobility comments: Pt up in chair  Transfers Overall transfer level: Needs assistance Equipment used: Rolling walker (2 wheeled) Transfers: Sit to/from Stand Sit to Stand: Min guard         General transfer comment: Incr time and effort to rise. Verbal cues for hand placement.  Ambulation/Gait Ambulation/Gait assistance: Supervision;Min guard Gait Distance (Feet): 175 Feet Assistive device: Rolling walker (2 wheeled) Gait Pattern/deviations: Step-through pattern;Decreased step length - right;Decreased step length - left;Antalgic;Decreased stance time - left Gait velocity: decr Gait velocity interpretation: <1.31 ft/sec, indicative of household ambulator General Gait Details: Assist for safety and balance. Pt unsteady with turns in bathroom. Verbal cues to stay closer to walker and to stand more erect.   Stairs             Wheelchair Mobility    Modified Rankin (Stroke Patients Only)       Balance Overall balance assessment: Needs assistance Sitting-balance support: No upper extremity supported;Feet supported Sitting balance-Leahy Scale: Good     Standing balance support: No upper extremity supported Standing balance-Leahy Scale: Fair Standing balance comment: needs UE support for any  dynamic movement                            Cognition Arousal/Alertness: Awake/alert Behavior During Therapy: WFL for tasks assessed/performed Overall Cognitive Status: Impaired/Different from baseline Area of Impairment: Problem solving;Safety/judgement                         Safety/Judgement: Decreased awareness of deficits   Problem Solving: Slow processing        Exercises      General Comments        Pertinent Vitals/Pain Pain Assessment: Faces Faces Pain Scale: Hurts little more Pain Location: L hip Pain Descriptors / Indicators: Grimacing;Guarding;Sore Pain Intervention(s): Monitored during session;Repositioned    Home Living                      Prior Function            PT Goals (current goals can now be found in the care plan section) Acute Rehab PT Goals Patient Stated Goal: return home Progress towards PT goals: Progressing toward goals    Frequency    Min 2X/week      PT Plan Discharge plan needs to be updated;Frequency needs to be updated    Co-evaluation              AM-PAC PT "6 Clicks" Mobility   Outcome Measure  Help needed turning from your back to your side while in a flat  bed without using bedrails?: A Little Help needed moving from lying on your back to sitting on the side of a flat bed without using bedrails?: A Little Help needed moving to and from a bed to a chair (including a wheelchair)?: A Little Help needed standing up from a chair using your arms (e.g., wheelchair or bedside chair)?: A Little Help needed to walk in hospital room?: A Little Help needed climbing 3-5 steps with a railing? : A Little 6 Click Score: 18    End of Session Equipment Utilized During Treatment: Gait belt Activity Tolerance: Patient tolerated treatment well Patient left: in chair;with call bell/phone within reach Nurse Communication: Mobility status PT Visit Diagnosis: Other abnormalities of gait and mobility  (R26.89);Pain Pain - Right/Left: Left Pain - part of body: Leg     Time: 1173-5670 PT Time Calculation (min) (ACUTE ONLY): 19 min  Charges:  $Gait Training: 8-22 mins                     [place] Pager [phone] Office [place] 08/15/2019, 2:11 PM

## 2019-08-15 NOTE — Accreditation Note (Signed)
Patient has home unit at bedside.  Will continue to monitor.

## 2019-08-15 NOTE — TOC Progression Note (Signed)
Transition of Care [facility]) - Progression Note    Patient Details  Name: Steve Brown MRN: [id number] Date of Birth: 1940/03/12  [facility] CM/SW [place], [place] Phone Number: 08/15/2019, 3:56 PM  Clinical Narrative:    CSW met with patient regarding SNF. Prior to arrival, patient was independent and ambulating well. Patient is vaccinated. Patient expressed concern with the steps in his home and provided permission for referrals to be faxed for SNF placement. TOC team will continue to follow.        Expected Discharge Plan and Services           Expected Discharge Date: 08/15/19                                     Social Determinants of Health (SDOH) Interventions    Readmission Risk Interventions No flowsheet data found.

## 2019-08-15 NOTE — Discharge Instructions (Signed)
Spinal Stenosis  Spinal stenosis occurs when the open space (spinal canal) between the bones of your spine (vertebrae) narrows, putting pressure on the spinal cord or nerves. What are the causes? This condition is caused by areas of bone pushing into the central canals of your vertebrae. This condition may be present at birth (congenital), or it may be caused by:  Arthritic deterioration of your vertebrae (spinal degeneration). This usually starts around age 50.  Injury or trauma to the spine.  Tumors in the spine.  Calcium deposits in the spine. What are the signs or symptoms? Symptoms of this condition include:  Pain in the neck or back that is generally worse with activities, particularly when standing and walking.  Numbness, tingling, hot or cold sensations, weakness, or weariness in your legs.  Pain going up and down the leg (sciatica).  Frequent episodes of falling.  A foot-slapping gait that leads to muscle weakness. In more serious cases, you may develop:  Problemspassing stool or passing urine.  Difficulty having sex.  Loss of feeling in part or all of your leg. Symptoms may come on slowly and get worse over time. How is this diagnosed? This condition is diagnosed based on your medical history and a physical exam. Tests will also be done, such as:  MRI.  CT scan.  X-ray. How is this treated? Treatment for this condition often focuses on managing your pain and any other symptoms. Treatment may include:  Practicing good posture to lessen pressure on your nerves.  Exercising to strengthen muscles, build endurance, improve balance, and maintain good joint movement (range of motion).  Losing weight, if needed.  Taking medicines to reduce swelling, inflammation, or pain.  Assistive devices, such as a corset or brace. In some cases, surgery may be needed. The most common procedure is decompression laminectomy. This is done to remove excess bone that puts  pressure on your nerve roots. Follow these instructions at home: Managing pain, stiffness, and swelling  Do all exercises and stretches as told by your health care provider.  Practice good posture. If you were given a brace or a corset, wear it as told by your health care provider.  Do not do any activities that cause pain. Ask your health care provider what activities are safe for you.  Do not lift anything that is heavier than 10 lb (4.5 kg) or the limit that your health care provider tells you.  Maintain a healthy weight. Talk with your health care provider if you need help losing weight.  If directed, apply heat to the affected area as often as told by your health care provider. Use the heat source that your health care provider recommends, such as a moist heat pack or a heating pad. ? Place a towel between your skin and the heat source. ? Leave the heat on for 20-30 minutes. ? Remove the heat if your skin turns bright red. This is especially important if you are not able to feel pain, heat, or cold. You may have a greater risk of getting burned. General instructions  Take over-the-counter and prescription medicines only as told by your health care provider.  Do not use any products that contain nicotine or tobacco, such as cigarettes and e-cigarettes. If you need help quitting, ask your health care provider.  Eat a healthy diet. This includes plenty of fruits and vegetables, whole grains, and low-fat (lean) protein.  Keep all follow-up visits as told by your health care provider. This is important. Contact   a health care provider if:  Your symptoms do not get better or they get worse.  You have a fever. Get help right away if:  You have new or worse pain in your neck or upper back.  You have severe pain that cannot be controlled with medicines.  You are dizzy.  You have vision problems, blurred vision, or double vision.  You have a severe headache that is worse when you  stand.  You have nausea or you vomit.  You develop new or worse numbness or tingling in your back or legs.  You have pain, redness, swelling, or warmth in your arm or leg. Summary  Spinal stenosis occurs when the open space (spinal canal) between the bones of your spine (vertebrae) narrows. This narrowing puts pressure on the spinal cord or nerves.  Spinal stenosis can cause numbness, weakness, or pain in the neck, back, and legs.  This condition may be caused by a birth defect, arthritic deterioration of your vertebrae, injury, tumors, or calcium deposits.  This condition is usually diagnosed with MRIs, CT scans, and X-rays. This information is not intended to replace advice given to you by your health care provider. Make sure you discuss any questions you have with your health care provider. Document Revised: 12/08/2016 Document Reviewed: 12/01/2015 Elsevier Patient Education  2020 [place]. Sciatica  Sciatica is pain, numbness, weakness, or tingling along the path of the sciatic nerve. The sciatic nerve starts in the lower back and runs down the back of each leg. The nerve controls the muscles in the lower leg and in the back of the knee. It also provides feeling (sensation) to the back of the thigh, the lower leg, and the sole of the foot. Sciatica is a symptom of another medical condition that pinches or puts pressure on the sciatic nerve. Sciatica most often only affects one side of the body. Sciatica usually goes away on its own or with treatment. In some cases, sciatica may come back (recur). What are the causes? This condition is caused by pressure on the sciatic nerve or pinching of the nerve. This may be the result of:  A disk in between the bones of the spine bulging out too far (herniated disk).  Age-related changes in the spinal disks.  A pain disorder that affects a muscle in the buttock.  Extra bone growth near the sciatic nerve.  A break (fracture) of the  pelvis.  Pregnancy.  Tumor. This is rare. What increases the risk? The following factors may make you more likely to develop this condition:  Playing sports that place pressure or stress on the spine.  Having poor strength and flexibility.  A history of back injury or surgery.  Sitting for long periods of time.  Doing activities that involve repetitive bending or lifting.  Obesity. What are the signs or symptoms? Symptoms can vary from mild to very severe, and they may include:  Any of these problems in the lower back, leg, hip, or buttock: ? Mild tingling, numbness, or dull aches. ? Burning sensations. ? Sharp pains.  Numbness in the back of the calf or the sole of the foot.  Leg weakness.  Severe back pain that makes movement difficult. Symptoms may get worse when you cough, sneeze, or laugh, or when you sit or stand for long periods of time. How is this diagnosed? This condition may be diagnosed based on:  Your symptoms and medical history.  A physical exam.  Blood tests.  Imaging tests,  such as: ? X-rays. ? MRI. ? CT scan. How is this treated? In many cases, this condition improves on its own without treatment. However, treatment may include:  Reducing or modifying physical activity.  Exercising and stretching.  Icing and applying heat to the affected area.  Medicines that help to: ? Relieve pain and swelling. ? Relax your muscles.  Injections of medicines that help to relieve pain, irritation, and inflammation around the sciatic nerve (steroids).  Surgery. Follow these instructions at home: Medicines  Take over-the-counter and prescription medicines only as told by your health care provider.  Ask your health care provider if the medicine prescribed to you: ? Requires you to avoid driving or using heavy machinery. ? Can cause constipation. You may need to take these actions to prevent or treat constipation:  Drink enough fluid to keep your  urine pale yellow.  Take over-the-counter or prescription medicines.  Eat foods that are high in fiber, such as beans, whole grains, and fresh fruits and vegetables.  Limit foods that are high in fat and processed sugars, such as fried or sweet foods. Managing pain      If directed, put ice on the affected area. ? Put ice in a plastic bag. ? Place a towel between your skin and the bag. ? Leave the ice on for 20 minutes, 2-3 times a day.  If directed, apply heat to the affected area. Use the heat source that your health care provider recommends, such as a moist heat pack or a heating pad. ? Place a towel between your skin and the heat source. ? Leave the heat on for 20-30 minutes. ? Remove the heat if your skin turns bright red. This is especially important if you are unable to feel pain, heat, or cold. You may have a greater risk of getting burned. Activity   Return to your normal activities as told by your health care provider. Ask your health care provider what activities are safe for you.  Avoid activities that make your symptoms worse.  Take brief periods of rest throughout the day. ? When you rest for longer periods, mix in some mild activity or stretching between periods of rest. This will help to prevent stiffness and pain. ? Avoid sitting for long periods of time without moving. Get up and move around at least one time each hour.  Exercise and stretch regularly, as told by your health care provider.  Do not lift anything that is heavier than 10 lb (4.5 kg) while you have symptoms of sciatica. When you do not have symptoms, you should still avoid heavy lifting, especially repetitive heavy lifting.  When you lift objects, always use proper lifting technique, which includes: ? Bending your knees. ? Keeping the load close to your body. ? Avoiding twisting. General instructions  Maintain a healthy weight. Excess weight puts extra stress on your back.  Wear supportive,  comfortable shoes. Avoid wearing high heels.  Avoid sleeping on a mattress that is too soft or too hard. A mattress that is firm enough to support your back when you sleep may help to reduce your pain.  Keep all follow-up visits as told by your health care provider. This is important. Contact a health care provider if:  You have pain that: ? Wakes you up when you are sleeping. ? Gets worse when you lie down. ? Is worse than you have experienced in the past. ? Lasts longer than 4 weeks.  You have an unexplained weight loss.  Get help right away if:  You are not able to control when you urinate or have bowel movements (incontinence).  You have: ? Weakness in your lower back, pelvis, buttocks, or legs that gets worse. ? Redness or swelling of your back. ? A burning sensation when you urinate. Summary  Sciatica is pain, numbness, weakness, or tingling along the path of the sciatic nerve.  This condition is caused by pressure on the sciatic nerve or pinching of the nerve.  Sciatica can cause pain, numbness, or tingling in the lower back, legs, hips, and buttocks.  Treatment often includes rest, exercise, medicines, and applying ice or heat. This information is not intended to replace advice given to you by your health care provider. Make sure you discuss any questions you have with your health care provider. Document Revised: 01/14/2018 Document Reviewed: 01/14/2018 Elsevier Patient Education  [place] on my medicine - XARELTO (rivaroxaban)  [place]? Xarelto was prescribed to treat blood clots that may have been found in the veins of your legs (deep vein thrombosis) or in your lungs (pulmonary embolism) and to reduce the risk of them occurring again.  What do you need to know about Xarelto? Take one 20 mg tablet taken ONCE A DAY with your evening meal.  DO NOT stop taking Xarelto without talking to the health care provider  who prescribed the medication.  Refill your prescription for 20 mg tablets before you run out.  After discharge, you should have regular check-up appointments with your healthcare provider that is prescribing your Xarelto.  In the future your dose may need to be changed if your kidney function changes by a significant amount.  What do you do if you miss a dose? If you are taking Xarelto TWICE DAILY and you miss a dose, take it as soon as you remember. You may take two 15 mg tablets (total 30 mg) at the same time then resume your regularly scheduled 15 mg twice daily the next day.  If you are taking Xarelto ONCE DAILY and you miss a dose, take it as soon as you remember on the same day then continue your regularly scheduled once daily regimen the next day. Do not take two doses of Xarelto at the same time.   Important Safety Information Xarelto is a blood thinner medicine that can cause bleeding. You should call your healthcare provider right away if you experience any of the following: ? Bleeding from an injury or your nose that does not stop. ? Unusual colored urine (red or dark brown) or unusual colored stools (red or black). ? Unusual bruising for unknown reasons. ? A serious fall or if you hit your head (even if there is no bleeding).  Some medicines may interact with Xarelto and might increase your risk of bleeding while on Xarelto. To help avoid this, consult your healthcare provider or pharmacist prior to using any new prescription or non-prescription medications, including herbals, vitamins, non-steroidal anti-inflammatory drugs (NSAIDs) and supplements.  This website has more information on Xarelto: [email or web address]

## 2019-08-15 NOTE — NC FL2 (Signed)
[place] LEVEL OF CARE SCREENING TOOL     IDENTIFICATION  Patient Name: Steve Brown Birthdate: 07/20/1940 Sex: male Admission Date (Current Location): 08/11/2019  [facility] [place] Number:  Herbalist and Address:  [facility] [place] [place], [place] [address] [place], [place] [address]      Provider Number: [id number]  Attending Physician Name and Address:  [place] Donnamarie Poag, DO  Relative Name and Phone Number:       Current Level of Care: Hospital Recommended Level of Care: [place] Prior Approval Number:    Date Approved/Denied:   PASRR Number: [id number]  Discharge Plan: SNF    Current Diagnoses: Patient Active Problem List   Diagnosis Date Noted  . Lumbar spinal stenosis 08/15/2019  . Nonspecific chest pain 08/11/2019  . Shortness of breath   . 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 ([place]) 10/21/2018  . Nocturnal hypoxemia 10/21/2018  . Radiation therapy complication 52/84/1324  . Primary prostate cancer ([place]) 07/31/2017  . Anemia 07/31/2017  . Vertigo due to cerebrovascular disease 07/31/2017  . Poor compliance with CPAP treatment 07/31/2017  . Absolute anemia 05/16/2017  . Avitaminosis D 05/16/2017  . Benign essential HTN 05/16/2017  . Benign prostatic hypertrophy without urinary obstruction 05/16/2017  . Clinical depression 05/16/2017  . CN (constipation) 05/16/2017  . Current drug use 05/16/2017  . Diabetic neuropathy ([place]) 05/16/2017  . Genital herpes 05/16/2017  . Infarction of lung due to iatrogenic pulmonary embolism ([place]) 05/16/2017  . Sciatica associated with disorder of lumbar spine 05/16/2017  . Arteriosclerosis of coronary artery 05/16/2017  . Artery disease, cerebral 05/16/2017  . Apnea, sleep 05/16/2017  . Arthralgia of hip or thigh 05/16/2017  . Malignant neoplasm of prostate ([place]) 09/28/2016  . Vascular dementia in remission ([place]) 09/28/2016  . Remote history of stroke  09/28/2016  . Acute kidney injury ([place]) 05/18/2016  . Dehydration 05/18/2016  . Near syncope 05/18/2016  . Orthostatic hypotension 05/18/2016  . CKD (chronic kidney disease), stage II 04/26/2016  . UTI (urinary tract infection) 04/26/2016  . Encounter for counseling on use of CPAP 11/18/2015  . Chronic cholecystitis with calculus 11/02/2015  . Stroke, 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[place]) 04/22/2015  . TIA (transient ischemic attack) 11/03/2014  . OSA on CPAP 05/18/2014  . White matter disease 08/14/2013  . OSA (obstructive sleep apnea) 08/14/2013  . Abnormal x-ray of temporomandibular joint 05/30/2013  . Chronic infection of sinus 05/30/2013  . Cough 05/30/2013  . Fatigue 05/30/2013  . Unsteady gait 05/19/2013  . Combined fat and carbohydrate induced hyperlipemia 04/24/2013  . Syncope 03/20/2013  . Angina pectoris ([place]) 10/08/2012  . Hypertension   . Stroke ([place])   . History of TIAs   . Lumbago   . Other and unspecified hyperlipidemia   . Personal history of unspecified circulatory disease   . Unspecified fall   . Pain in joint, multiple sites   . Degeneration of cervical intervertebral disc   . Unspecified cardiovascular disease   . History of pulmonary embolism: June 2014,  Takes Xarelto 07/01/2012  . Hemiparesis ([place]) 07/18/2011  . Diabetes mellitus type 2 with complications ([place]) 40/10/2723  . HTN (hypertension) 07/18/2011  . CAD in native artery 07/18/2011  . History of recurrent TIAs 07/18/2011    Orientation RESPIRATION BLADDER Height & Weight     Self, Time, Situation, Place  Normal Continent Weight: 200 lb 2.8 oz (90.8 kg) Height:  5\' 5"  (165.1 cm)  BEHAVIORAL SYMPTOMS/MOOD NEUROLOGICAL BOWEL NUTRITION STATUS  Continent Diet (See discharge summary)  AMBULATORY STATUS COMMUNICATION OF NEEDS Skin   Limited Assist Verbally Normal                       Personal Care Assistance Level of Assistance  Feeding, Dressing, Bathing Bathing Assistance: Limited  assistance Feeding assistance: Independent Dressing Assistance: Limited assistance     Functional Limitations Info  Sight, Hearing, Speech Sight Info: Adequate Hearing Info: Adequate Speech Info: Adequate    SPECIAL CARE FACTORS FREQUENCY  PT (By licensed PT), OT (By licensed OT)     PT Frequency: 5x a week OT Frequency: 5x a week            Contractures Contractures Info: Not present    Additional Factors Info  Code Status, Allergies Code Status Info: Full Allergies Info: Phenergan (promethazine); Tramadol           Current Medications (08/15/2019):  This is the current hospital active medication list Current Facility-Administered Medications  Medication Dose Route Frequency Provider Last Rate Last Admin  . acetaminophen (TYLENOL) tablet 650 mg  650 mg Oral Q6H PRN Toy Baker, MD   650 mg at 08/13/19 1750   Or  . acetaminophen (TYLENOL) suppository 650 mg  650 mg Rectal Q6H PRN Doutova, Anastassia, MD      . bisacodyl (DULCOLAX) suppository 10 mg  10 mg Rectal Once Doutova, Anastassia, MD      . diclofenac Sodium (VOLTAREN) 1 % topical gel 4 g  4 g Topical QID [place] Donnamarie Poag, DO   4 g at 08/15/19 2831  . docusate sodium (COLACE) capsule 100 mg  100 mg Oral BID Toy Baker, MD   100 mg at 08/15/19 0823  . HYDROcodone-acetaminophen (NORCO/VICODIN) 5-325 MG per tablet 1-2 tablet  1-2 tablet Oral Q4H PRN Toy Baker, MD   2 tablet at 08/15/19 0823  . insulin aspart (novoLOG) injection 0-6 Units  0-6 Units Subcutaneous TID WC [place] Eric J, DO   4 Units at 08/15/19 1224  . insulin aspart (novoLOG) injection 2 Units  2 Units Subcutaneous TID WC [place] Eric J, DO   2 Units at 08/15/19 1224  . insulin glargine (LANTUS) injection 20 Units  20 Units Subcutaneous Daily [place] Eric J, DO   20 Units at 08/15/19 0915  . nitroGLYCERIN (NITROSTAT) SL tablet 0.4 mg  0.4 mg Sublingual Q5 min PRN Toy Baker, MD      . ondansetron (ZOFRAN) tablet 4  mg  4 mg Oral Q6H PRN Doutova, Anastassia, MD       Or  . ondansetron (ZOFRAN) injection 4 mg  4 mg Intravenous Q6H PRN Doutova, Anastassia, MD      . pneumococcal 23 valent vaccine (PNEUMOVAX-23) injection 0.5 mL  0.5 mL Intramuscular Prior to discharge Nita Sells, MD      . polyethylene glycol (MIRALAX / GLYCOLAX) packet 17 g  17 g Oral Daily Doutova, Anastassia, MD   17 g at 08/12/19 1436  . predniSONE (DELTASONE) tablet 60 mg  60 mg Oral Q breakfast [place] Eric J, DO   60 mg at 08/15/19 5176  . pregabalin (LYRICA) capsule 300 mg  300 mg Oral BID Toy Baker, MD   300 mg at 08/15/19 0823  . rivaroxaban (XARELTO) tablet 20 mg  20 mg Oral Q supper [place] Eric J, DO   20 mg at 08/14/19 1703  . senna (SENOKOT) tablet 8.6 mg  1 tablet Oral Daily Nita Sells, MD   8.6  mg at 08/15/19 0824  . sodium chloride flush (NS) 0.9 % injection 3 mL  3 mL Intravenous Q12H Toy Baker, MD   3 mL at 08/15/19 9937     Discharge Medications: Please see discharge summary for a list of discharge medications.  Relevant Imaging Results:  Relevant Lab Results:   Additional Information SSN [id number]  Tyresha Fede J [place], [place]

## 2019-08-15 NOTE — Progress Notes (Signed)
Put water in machine for patient, unit is at bedside and ready for patient to use.  Unit is patient's home machine and he self manages it.

## 2019-08-15 NOTE — Evaluation (Addendum)
Occupational Therapy Evaluation Patient Details Name: Steve Brown MRN: [id number] DOB: [birth year] Today's Date: 08/15/2019    History of Present Illness Pt adm with chest pain and found to have acute on chronic diastolic heart failure. PMH - CAD, DM, CVA, CKD, PE, chronic back pain   Clinical Impression   Pt was independent in ADL and IADL prior to admission. 2 months ago he reports being able to walk 2 miles on the track at the [facility]. Pt presents with L hip pain, generalized weakness and slow processing with questionable memory. Pt needs up to moderate assistance for ADL. Educated in benefits of 3 in 1 and requested one for home. Began educating pt in energy conservation and recommended seated showering. Pt with 16 steps to get into his second floor apartment.  Will follow acutely, recommending HHOT.    Follow Up Recommendations  SNF    Equipment Recommendations  3 in 1 bedside commode    Recommendations for Other Services       Precautions / Restrictions Precautions Precautions: Fall Restrictions Weight Bearing Restrictions: No      Mobility Bed Mobility Overal bed mobility: Needs Assistance Bed Mobility: Rolling;Sidelying to Sit Rolling: Supervision Sidelying to sit: Supervision       General bed mobility comments: educated in technique, increased time and effort, HOB nearly flat  Transfers Overall transfer level: Needs assistance Equipment used: Rolling walker (2 wheeled) Transfers: Sit to/from Stand Sit to Stand: Supervision         General transfer comment: cues for hand placement from elevated surface, recommended pt place blocks under his rocking chair at home    Balance   Sitting-balance support: No upper extremity supported;Feet supported Sitting balance-Leahy Scale: Good       Standing balance-Leahy Scale: Fair Standing balance comment: needs UE support for any dynamic movement                           ADL either performed or  assessed with clinical judgement   ADL Overall ADL's : Needs assistance/impaired Eating/Feeding: Independent;Sitting   Grooming: Supervision/safety;Standing;Wash/dry hands   Upper Body Bathing: Set up;Sitting   Lower Body Bathing: Moderate assistance;Sit to/from stand   Upper Body Dressing : Set up;Sitting   Lower Body Dressing: Moderate assistance;Sit to/from stand   Toilet Transfer: Supervision/safety;Ambulation;RW;BSC Toilet Transfer Details (indicate cue type and reason): educated in use of 3 in 1 over toilet Toileting- Clothing Manipulation and Hygiene: Supervision/safety;Sit to/from Nurse, children's Details (indicate cue type and reason): recommended pt consider tub bench Functional mobility during ADLs: Supervision/safety;Rolling walker       Vision Patient Visual Report: No change from baseline       Perception     Praxis      Pertinent Vitals/Pain Pain Assessment: Faces Faces Pain Scale: Hurts little more Pain Location: L hip Pain Descriptors / Indicators: Grimacing;Guarding;Sore Pain Intervention(s): Monitored during session;Repositioned     Hand Dominance Right   Extremity/Trunk Assessment Upper Extremity Assessment Upper Extremity Assessment: Generalized weakness;Overall [facility] for tasks assessed   Lower Extremity Assessment Lower Extremity Assessment: Defer to PT evaluation       Communication Communication Communication: HOH   Cognition Arousal/Alertness: Awake/alert Behavior During Therapy: WFL for tasks assessed/performed Overall Cognitive Status: Impaired/Different from baseline Area of Impairment: Problem solving;Safety/judgement                         Safety/Judgement: Decreased  awareness of deficits   Problem Solving: Slow processing     General Comments       Exercises     Shoulder Instructions      Home Living Family/patient expects to be discharged to:: Private residence Living Arrangements:  Alone Available Help at Discharge: Family;Friend(s);Available PRN/intermittently Type of Home: Apartment Home Access: Stairs to enter Entrance Stairs-Number of Steps: 16 Entrance Stairs-Rails: Right Home Layout: One level     Bathroom Shower/Tub: Teacher, early years/pre: Standard     Home Equipment: Cane - single point          Prior Functioning/Environment Level of Independence: Independent with assistive device(s)        Comments: Uses cane at times. Drives, grocery shopping, had been having more difficulty with activity tolerance in the last 2 months        OT Problem List: Decreased strength;Impaired balance (sitting and/or standing);Decreased activity tolerance;Decreased knowledge of use of DME or AE;Increased edema;Decreased safety awareness;Decreased cognition      OT Treatment/Interventions: Self-care/ADL training;DME and/or AE instruction;Balance training;Patient/family education;Therapeutic activities    OT Goals(Current goals can be found in the care plan section) Acute Rehab OT Goals Patient Stated Goal: return home OT Goal Formulation: With patient Time For Goal Achievement: 08/22/19 Potential to Achieve Goals: Good ADL Goals Pt Will Perform Lower Body Bathing: with modified independence;with adaptive equipment;sit to/from stand Pt Will Perform Lower Body Dressing: with modified independence;with adaptive equipment;sit to/from stand Pt Will Transfer to Toilet: with modified independence;ambulating;bedside commode (over toilet) Pt Will Perform Toileting - Clothing Manipulation and hygiene: with modified independence;sit to/from stand Additional ADL Goal #1: Pt will state at least 3 energy conservation strategies as instructed.  OT Frequency: Min 2X/week   Barriers to D/C:            Co-evaluation              AM-PAC OT "6 Clicks" Daily Activity     Outcome Measure Help from another person eating meals?: None Help from another person  taking care of personal grooming?: A Little Help from another person toileting, which includes using toliet, bedpan, or urinal?: A Little Help from another person bathing (including washing, rinsing, drying)?: A Lot Help from another person to put on and taking off regular upper body clothing?: A Little Help from another person to put on and taking off regular lower body clothing?: A Lot 6 Click Score: 17   End of Session Equipment Utilized During Treatment: Rolling walker Nurse Communication: Mobility status  Activity Tolerance: Patient tolerated treatment well Patient left: in chair;with call bell/phone within reach  OT Visit Diagnosis: Unsteadiness on feet (R26.81);Pain;Muscle weakness (generalized) (M62.81);Other symptoms and signs involving cognitive function                Time: 0933-1005 OT Time Calculation (min): 32 min Charges:  OT General Charges $OT Visit: 1 Visit OT Evaluation $OT Eval Moderate Complexity: 1 Mod OT Treatments $Self Care/Home Management : 8-22 mins  Nestor Lewandowsky, OTR/L Acute Rehabilitation Services Pager: [phone] Office: [phone]  Malka So 08/15/2019, 10:17 AM

## 2019-08-16 LAB — GLUCOSE, CAPILLARY
Glucose-Capillary: 301 mg/dL — ABNORMAL HIGH (ref 70–99)
Glucose-Capillary: 241 mg/dL — ABNORMAL HIGH (ref 70–99)
Glucose-Capillary: 294 mg/dL — ABNORMAL HIGH (ref 70–99)
Glucose-Capillary: 270 mg/dL — ABNORMAL HIGH (ref 70–99)

## 2019-08-16 LAB — BASIC METABOLIC PANEL
Anion gap: 10 (ref 5–15)
BUN: 36 mg/dL — ABNORMAL HIGH (ref 8–23)
CO2: 28 mmol/L (ref 22–32)
Calcium: 9 mg/dL (ref 8.9–10.3)
Chloride: 99 mmol/L (ref 98–111)
Creatinine, Ser: 1.5 mg/dL — ABNORMAL HIGH (ref 0.61–1.24)
GFR calc Af Amer: 51 mL/min — ABNORMAL LOW (ref >60)
GFR calc non Af Amer: 44 mL/min — ABNORMAL LOW (ref >60)
Glucose, Bld: 327 mg/dL — ABNORMAL HIGH (ref 70–99)
Potassium: 4.5 mmol/L (ref 3.5–5.1)
Sodium: 137 mmol/L (ref 135–145)

## 2019-08-16 NOTE — TOC Progression Note (Addendum)
Transition of Care [facility]) - Progression Note    Patient Details  Name: Steve Brown MRN: [id number] Date of Birth: 21-Jan-1940  Transition of Care [facility]) CM/SW [place], [place] Phone Number: 08/16/2019, 2:43 PM  Clinical Narrative:     CSW spoke to pt about SNF chose of [facility]. Pt stated that has had friends at that facility and stated he doesn't want to go there. Pt stated he thinks he will be fine to go home with HHPT. Pt has no preference in facility.  Pt states he lives on the 2nd floor apartment and has 16 steps to get into his apartment. CSW inquired about support system. Pt stated he has a friend that he trusts to do his grocery shopping until hes moving better. Pt states he can get around in his him with no problem. Pt does not have 24 hour car but stated betseen his friends and his daughter he will be fine with one of them checking in on him.   CSW informed NCM of change from snf to [facility]. Pt states he will need PTAR home.  Expected Discharge Plan: [place]    Expected Discharge Plan and Services Expected Discharge Plan: [place]         Expected Discharge Date: 08/16/19                                     Social Determinants of Health (SDOH) Interventions    Readmission Risk Interventions No flowsheet data found.  Emeterio Reeve, Latanya Presser, [place] Social Worker [phone]

## 2019-08-16 NOTE — Progress Notes (Signed)
PROGRESS NOTE    Steve Brown  JME:[id number] DOB: [birth year] DOA: 08/11/2019 PCP: Lawerance Cruel, MD    Brief Narrative:  Steve Brown is a 79 year old male with past medical history CAD status post PCI/DES 2010, insulin-dependent diabetes mellitus, history of CVA, OSA on CPAP, history of PE on Xarelto, CKD stage IIIb who presented to the ED with lower extremity swelling, abdominal swelling and chest pain.  In the ED, patient received aspirin 3 and 25 mg and sublingual nitroglycerin with relief of his chest pain. He was given Lasix 60 mg IV. Cardiology was consulted and EDP requested hospitalist admission for further evaluation and treatment.   Assessment & Plan:   Active Problems:   Diabetes mellitus type 2 with complications (HCC)   HTN (hypertension)   CAD in native artery   History of recurrent TIAs   History of pulmonary embolism: June 2014,  Takes Xarelto   OSA on CPAP   CKD (chronic kidney disease), stage II   Benign essential HTN   Sciatica associated with disorder of lumbar spine   Apnea, sleep   Nonspecific chest pain   Lumbar spinal stenosis   Chest pain Presenting to ED with 1 week history of worsening chest pain in the setting of known CAD with prior stenting LAD 2010.Troponin 12>12>12>11>12; flat.  Underwent Lexiscan Myoview stress test on 08/13/2019 without ischemia or infarction noted.  --Continues to be chest pain-free, not on beta-blocker secondary to intolerance given history of bradycardia --Continue Xarelto --Cardiology signed off with outpatient follow-up scheduled  Acute on chronic diastolic congestive heart failure TTE 08/08/2019 with LVEF and no regional wall motion abnormalities. CT chest negative for PE or pulmonary edema. BNP 93. Was started on aggressive IV diuresis with furosemide 60 mg IV every 12 hours, now on hold given bump in creatinine. --net negative 2.0L since admission --Continue strict I's and O's and daily weights --Close  monitoring of creatinine --Continue hold furosemide  CKD stage IIIb Baseline and 1.2-1.4. --Cr 1.48>1.39>1.95>1.88>1.55>1.50 --Lasix currently on hold due to bump in creatinine --Continue to monitor renal function closely daily  History of pulmonary embolism, 2014 On Xarelto outpatient, currently being held for possible need of LHC. CT angiogram chest with no pulmonary embolism. Left lower extremity Doppler ultrasound negative for DVT. --Resumed home Xarelto  Insulin-dependent diabetes mellitus On Tresiba 26u [place] daily, and Humalog 6-10u [place] TIDAC ISS at home.  Hemoglobin A1c 7.5 on 08/12/2019. --Diabetic educator following, appreciate assistance --Lantus 30 units [place] daily --very sensitive insulin sliding scale for further coverage --CBGs every before every meal/at bedtime  Left leg pain Patient with history of chronic low back pain on oxycodone 20 mg every 4 hours as needed outpatient.  Complains of left leg pain with hip flexion that radiates down the posterior thigh.  Concern for possible sciatica.  CT L-spine notable for multilevel degenerative disc disease with severe stenosis L4/5. --prednisone 50 mg p.o. daily --Voltaren gel, apply to affected area as needed --Avoid NSAIDs in the setting of CKD --Continue home Vicodin --PT/OT following --Plan discharge tomorrow with home health PT   DVT prophylaxis: Lovenox Code Status: Full code Family Communication: No family present at bedside this morning  Disposition Plan:  Status is: Inpatient  Remains inpatient appropriate because:Ongoing diagnostic testing needed not appropriate for outpatient work up   Dispo: The patient is from: Home              Anticipated d/c is to: Home  Anticipated d/c date is: 1 day              Patient currently is not medically stable to d/c.    Consultants:   Cardiology - signed off 8/5  Procedures:   MYOCARDIAL IMAGING WITH SPECT (REST AND PHARMACOLOGIC-STRESS) GATED LEFT  VENTRICULAR WALL MOTION STUDY LEFT VENTRICULAR EJECTION FRACTION  TECHNIQUE: Standard myocardial SPECT imaging was performed after resting intravenous injection of 10 mCi Tc-73m tetrofosmin. Subsequently, intravenous infusion of Lexiscan was performed under the supervision of the Cardiology staff. At peak effect of the drug, 30 mCi Tc-70m tetrofosmin was injected intravenously and standard myocardial SPECT imaging was performed. Quantitative gated imaging was also performed to evaluate left ventricular wall motion, and estimate left ventricular ejection fraction.  COMPARISON:  None.  FINDINGS: Perfusion: No decreased activity in the left ventricle on stress imaging to suggest reversible ischemia or infarction.  Wall Motion: Normal left ventricular wall motion. No left ventricular dilation.  Left Ventricular Ejection Fraction: 54 %  End diastolic volume 57 ml  End systolic volume 26 ml  IMPRESSION: 1. No reversible ischemia or infarction. 2. Normal left ventricular wall motion. 3. Left ventricular ejection fraction 54% 4. Non invasive risk stratification*: Low  Antimicrobials:   none   Subjective: Patient seen and examined at bedside, lying in bed.  Left leg pain improved.  Able to ambulate in the room back-and-forth to the bathroom and perform bath.  Was initially requesting SNF placement but wishes now to return home tomorrow with home health as he feels he can manage. No other complaints or concerns at this time.  Denies headache, no fever/chills/night sweats, no nausea/vomit/diarrhea, no chest pain, no palpitations, no shortness of breath, no abdominal pain. No acute events overnight per nursing staff.  Objective: Vitals:   08/15/19 1938 08/15/19 2312 08/16/19 0351 08/16/19 0735  BP: (!) 137/92 (!) 141/93 (!) 144/67 (!) 153/82  Pulse: 61 (!) 58 (!) 49 (!) 58  Resp: 18 19 14 17   Temp: 97.7 F (36.5 C) 97.7 F (36.5 C) 98.1 F (36.7 C) 98.1 F (36.7 C)    TempSrc: Oral Oral Oral Oral  SpO2: 96% 96% 96% 97%  Weight:      Height:        Intake/Output Summary (Last 24 hours) at 08/16/2019 1315 Last data filed at 08/15/2019 1701 Gross per 24 hour  Intake --  Output 300 ml  Net -300 ml   Filed Weights   08/11/19 1631 08/12/19 1330  Weight: 94.8 kg 90.8 kg    Examination:  General exam: Appears calm and comfortable  Respiratory system: Clear to auscultation. Respiratory effort normal.  Oxygenating well on room air Cardiovascular system: S1 & S2 heard, RRR. No JVD, murmurs, rubs, gallops or clicks.  1+ pitting edema bilateral lower extremities Gastrointestinal system: Abdomen is nondistended, soft and nontender. No organomegaly or masses felt. Normal bowel sounds heard. Central nervous system: Alert and oriented. No focal neurological deficits. Extremities: Symmetric 5 x 5 power. Pain with left hip flexion Skin: No rashes, lesions or ulcers Psychiatry: Judgement and insight appear normal. Mood & affect appropriate.     Data Reviewed: I have personally reviewed following labs and imaging studies  CBC: Recent Labs  Lab 08/11/19 1730 08/12/19 0424 08/13/19 0110  WBC 5.4 5.0 5.0  NEUTROABS 2.7 2.7 2.3  HGB 13.3 13.5 12.9*  HCT 39.6 39.6 38.0*  MCV 94.3 94.3 93.1  PLT 199 220 401   Basic Metabolic Panel: Recent Labs  Lab 08/12/19  0424 08/13/19 0110 08/14/19 0307 08/15/19 0805 08/16/19 0422  NA 139 137 138 138 137  K 3.8 4.4 4.2 3.9 4.5  CL 99 96* 98 99 99  CO2 30 30 28 29 28   GLUCOSE 176* 196* 248* 194* 327*  BUN 16 30* 33* 32* 36*  CREATININE 1.39* 1.95* 1.88* 1.53* 1.50*  CALCIUM 9.1 9.1 8.7* 9.0 9.0  MG 1.7  --   --   --   --   PHOS 4.0 4.8*  --   --   --    GFR: Estimated Creatinine Clearance: 42.7 mL/min (A) (by C-G formula based on SCr of 1.5 mg/dL (H)). Liver Function Tests: Recent Labs  Lab 08/11/19 1730 08/12/19 0424 08/13/19 0110  AST 21 19  --   ALT 19 19  --   ALKPHOS 65 62  --   BILITOT 0.9  1.1  --   PROT 6.7 6.5  --   ALBUMIN 3.6 3.4* 3.5   No results for input(s): LIPASE, AMYLASE in the last 168 hours. No results for input(s): AMMONIA in the last 168 hours. Coagulation Profile: No results for input(s): INR, PROTIME in the last 168 hours. Cardiac Enzymes: No results for input(s): CKTOTAL, CKMB, CKMBINDEX, TROPONINI in the last 168 hours. BNP (last 3 results) Recent Labs    08/06/19 1243  PROBNP 93   HbA1C: No results for input(s): HGBA1C in the last 72 hours. CBG: Recent Labs  Lab 08/15/19 1113 08/15/19 1615 08/15/19 2124 08/16/19 0619 08/16/19 1136  GLUCAP 319* 473* 360* 301* 241*   Lipid Profile: No results for input(s): CHOL, HDL, LDLCALC, TRIG, CHOLHDL, LDLDIRECT in the last 72 hours. Thyroid Function Tests: No results for input(s): TSH, T4TOTAL, FREET4, T3FREE, THYROIDAB in the last 72 hours. Anemia Panel: No results for input(s): VITAMINB12, FOLATE, FERRITIN, TIBC, IRON, RETICCTPCT in the last 72 hours. Sepsis Labs: No results for input(s): PROCALCITON, LATICACIDVEN in the last 168 hours.  Recent Results (from the past 240 hour(s))  SARS Coronavirus 2 by RT PCR (hospital order, performed in [facility] hospital lab) Nasopharyngeal Nasopharyngeal Swab     Status: None   Collection Time: 08/11/19  9:34 PM   Specimen: Nasopharyngeal Swab  Result Value Ref Range Status   SARS Coronavirus 2 NEGATIVE NEGATIVE Final    Comment: (NOTE) SARS-CoV-2 target nucleic acids are NOT DETECTED.  The SARS-CoV-2 RNA is generally detectable in upper and lower respiratory specimens during the acute phase of infection. The lowest concentration of SARS-CoV-2 viral copies this assay can detect is 250 copies / mL. A negative result does not preclude SARS-CoV-2 infection and should not be used as the sole basis for treatment or other patient management decisions.  A negative result may occur with improper specimen collection / handling, submission of specimen other than  nasopharyngeal swab, presence of viral mutation(s) within the areas targeted by this assay, and inadequate number of viral copies (<250 copies / mL). A negative result must be combined with clinical observations, patient history, and epidemiological information.  Fact Sheet for Patients:   StrictlyIdeas.no  Fact Sheet for Healthcare Providers: BankingDealers.co.za  This test is not yet approved or  cleared by the [place] FDA and has been authorized for detection and/or diagnosis of SARS-CoV-2 by FDA under an Emergency Use Authorization (EUA).  This EUA will remain in effect (meaning this test can be used) for the duration of the COVID-19 declaration under Section 564(b)(1) of the Act, 21 U.S.C. section 360bbb-3(b)(1), unless the authorization is terminated  or revoked sooner.  Performed at [facility], [place] [address], [place], [place] [address]   MRSA PCR Screening     Status: None   Collection Time: 08/12/19  3:39 PM   Specimen: Nasal Mucosa; Nasopharyngeal  Result Value Ref Range Status   MRSA by PCR NEGATIVE NEGATIVE Final    Comment:        The GeneXpert MRSA Assay (FDA approved for NASAL specimens only), is one component of a comprehensive MRSA colonization surveillance program. It is not intended to diagnose MRSA infection nor to guide or monitor treatment for MRSA infections. Performed at [facility], [place] [address], [place], [place] [address]          Radiology Studies: No results found.      Scheduled Meds: . bisacodyl  10 mg Rectal Once  . diclofenac Sodium  4 g Topical QID  . docusate sodium  100 mg Oral BID  . insulin aspart  0-15 Units Subcutaneous TID WC  . insulin aspart  2 Units Subcutaneous TID WC  . insulin glargine  30 Units Subcutaneous Daily  . polyethylene glycol  17 g Oral Daily  . predniSONE  50 mg Oral Q breakfast  . pregabalin  300 mg Oral BID  . rivaroxaban  20 mg  Oral Q supper  . senna  1 tablet Oral Daily  . sodium chloride flush  3 mL Intravenous Q12H   Continuous Infusions:   LOS: 4 days    Time spent: 35 minutes spent on chart review, discussion with nursing staff, consultants, updating family and interview/physical exam; more than 50% of that time was spent in counseling and/or coordination of care.    Felipe Cabell J [place] DO [facility] Available via Epic secure chat 7am-7pm After these hours, please refer to coverage provider listed on [email or web address] 08/16/2019, 1:15 PM

## 2019-08-16 NOTE — Plan of Care (Signed)

## 2019-08-17 LAB — GLUCOSE, CAPILLARY: Glucose-Capillary: 170 mg/dL — ABNORMAL HIGH (ref 70–99)

## 2019-08-17 MED ORDER — PREDNISONE 10 MG PO TABS: ORAL_TABLET | ORAL | 0 refills | Status: AC

## 2019-08-17 MED ORDER — PREDNISONE 20 MG PO TABS
40.0000 mg | ORAL_TABLET | Freq: Every day | ORAL | Status: DC
  Administered 2019-08-17: 40 mg via ORAL

## 2019-08-17 NOTE — TOC Transition Note (Signed)
Transition of Care [facility]) - CM/SW Discharge Note   Patient Details  Name: Steve Brown MRN: [id number] Date of Birth: 1940-10-31  Transition of Care [facility]) CM/SW Contact:  Pollie Friar, RN Phone Number: 08/17/2019, 11:03 AM   Clinical Narrative:    Pt discharging home with [facility] services through [place]. Cory with [facility] aware of d/c home today.  Pt has 3 in 1 and walker for home through [employer]. Daughter picked up the DME from the hospital with his CPAP from home.  Pt transporting home via PTAR. Pt has key for his apartment and daughter will meet him at his home.    Final next level of care: Home w Home Health Services Barriers to Discharge: No Barriers Identified   Patient Goals and CMS Choice   CMS Medicare.gov Compare Post Acute Care list provided to:: Patient Choice offered to / list presented to : Patient  Discharge Placement                       Discharge Plan and Services                DME Arranged: 3-N-1, Walker rolling DME Agency: [employer] Date DME Agency Contacted: 08/17/19   Representative spoke with at DME Agency: [place]: PT, OT [place] Agency: [place] Date [place]: 08/17/19   Representative spoke with at [place]: [place] ([place]) Interventions     Readmission Risk Interventions No flowsheet data found.

## 2019-08-17 NOTE — Progress Notes (Signed)
Patient discharge instructions, medications and followup appointments reviewed and patient verbalized understanding.  Written copy of instructions given to patient.  Patient via PTAR to his apartment in stable condition.

## 2019-08-17 NOTE — Plan of Care (Signed)

## 2019-08-18 ENCOUNTER — Other Ambulatory Visit: Payer: Self-pay

## 2019-08-18 DIAGNOSIS — I5023 Acute on chronic systolic (congestive) heart failure: Secondary | ICD-10-CM | POA: Diagnosis not present

## 2019-08-18 DIAGNOSIS — Z8673 Personal history of transient ischemic attack (TIA), and cerebral infarction without residual deficits: Secondary | ICD-10-CM | POA: Diagnosis not present

## 2019-08-18 DIAGNOSIS — I0981 Rheumatic heart failure: Secondary | ICD-10-CM | POA: Diagnosis not present

## 2019-08-18 DIAGNOSIS — M5432 Sciatica, left side: Secondary | ICD-10-CM | POA: Diagnosis not present

## 2019-08-18 DIAGNOSIS — Z955 Presence of coronary angioplasty implant and graft: Secondary | ICD-10-CM | POA: Diagnosis not present

## 2019-08-18 DIAGNOSIS — Z9181 History of falling: Secondary | ICD-10-CM | POA: Diagnosis not present

## 2019-08-18 DIAGNOSIS — M47896 Other spondylosis, lumbar region: Secondary | ICD-10-CM | POA: Diagnosis not present

## 2019-08-18 DIAGNOSIS — Z794 Long term (current) use of insulin: Secondary | ICD-10-CM | POA: Diagnosis not present

## 2019-08-18 DIAGNOSIS — I083 Combined rheumatic disorders of mitral, aortic and tricuspid valves: Secondary | ICD-10-CM | POA: Diagnosis not present

## 2019-08-18 DIAGNOSIS — I2510 Atherosclerotic heart disease of native coronary artery without angina pectoris: Secondary | ICD-10-CM | POA: Diagnosis not present

## 2019-08-18 DIAGNOSIS — M160 Bilateral primary osteoarthritis of hip: Secondary | ICD-10-CM | POA: Diagnosis not present

## 2019-08-18 DIAGNOSIS — M48061 Spinal stenosis, lumbar region without neurogenic claudication: Secondary | ICD-10-CM | POA: Diagnosis not present

## 2019-08-18 DIAGNOSIS — I700 Atherosclerosis of aorta: Secondary | ICD-10-CM | POA: Diagnosis not present

## 2019-08-18 DIAGNOSIS — G4733 Obstructive sleep apnea (adult) (pediatric): Secondary | ICD-10-CM | POA: Diagnosis not present

## 2019-08-18 DIAGNOSIS — Z7901 Long term (current) use of anticoagulants: Secondary | ICD-10-CM | POA: Diagnosis not present

## 2019-08-18 DIAGNOSIS — Z602 Problems related to living alone: Secondary | ICD-10-CM | POA: Diagnosis not present

## 2019-08-18 DIAGNOSIS — E1122 Type 2 diabetes mellitus with diabetic chronic kidney disease: Secondary | ICD-10-CM | POA: Diagnosis not present

## 2019-08-18 DIAGNOSIS — Z86711 Personal history of pulmonary embolism: Secondary | ICD-10-CM | POA: Diagnosis not present

## 2019-08-18 DIAGNOSIS — D509 Iron deficiency anemia, unspecified: Secondary | ICD-10-CM | POA: Diagnosis not present

## 2019-08-18 DIAGNOSIS — N1832 Chronic kidney disease, stage 3b: Secondary | ICD-10-CM | POA: Diagnosis not present

## 2019-08-18 DIAGNOSIS — I130 Hypertensive heart and chronic kidney disease with heart failure and stage 1 through stage 4 chronic kidney disease, or unspecified chronic kidney disease: Secondary | ICD-10-CM | POA: Diagnosis not present

## 2019-08-20 ENCOUNTER — Other Ambulatory Visit: Payer: Self-pay | Admitting: *Deleted

## 2019-08-20 DIAGNOSIS — I130 Hypertensive heart and chronic kidney disease with heart failure and stage 1 through stage 4 chronic kidney disease, or unspecified chronic kidney disease: Secondary | ICD-10-CM | POA: Diagnosis not present

## 2019-08-20 DIAGNOSIS — I5023 Acute on chronic systolic (congestive) heart failure: Secondary | ICD-10-CM | POA: Diagnosis not present

## 2019-08-20 DIAGNOSIS — I0981 Rheumatic heart failure: Secondary | ICD-10-CM | POA: Diagnosis not present

## 2019-08-20 DIAGNOSIS — N1832 Chronic kidney disease, stage 3b: Secondary | ICD-10-CM | POA: Diagnosis not present

## 2019-08-20 DIAGNOSIS — I2510 Atherosclerotic heart disease of native coronary artery without angina pectoris: Secondary | ICD-10-CM | POA: Diagnosis not present

## 2019-08-20 DIAGNOSIS — E1122 Type 2 diabetes mellitus with diabetic chronic kidney disease: Secondary | ICD-10-CM | POA: Diagnosis not present

## 2019-08-20 NOTE — Patient Outreach (Signed)
Steve Brown  08/20/2019  Steve Brown 1940-03-19 [id number]  Successful telephone outreach to patient. HIPAA identifiers obtained. Nurse answered incoming call from patient of which she previously outreached to as his Steve Brown. Patient was recently discharged from the hospital and called nurse to request assistance with receiving a life alert system. Nurse states she will send patient a brochure for a med alert system that is through [facility]. Nurse also explained to the patient that she will not be outreaching to him in the future. Nurse stated that a [facility] CM will contact him soon and that they would be able to give the patient more comprehensive care to support him from his recent hospitalization. Patient verbalized understanding.   Plan: Nurse Health Coach will send patient life alert brochure.   Emelia Loron RN, Chief Financial Officer Health Coach [phone] [email or web address]

## 2019-08-21 NOTE — Progress Notes (Signed)
Cardiology Office Note   Date:  08/22/2019   ID:  Steve Brown, DOB 1940/03/10, MRN [id number]  PCP:  Lawerance Cruel, MD    No chief complaint on file.  CAD  Wt Readings from Last 3 Encounters:  08/22/19 206 lb 3.2 oz (93.5 kg)  08/12/19 200 lb 2.8 oz (90.8 kg)  06/23/19 205 lb (93 kg)       History of Present Illness: Steve Brown is a 79 y.o. male   With a h/o CAD. He has an LAD DES and moderate circumflexdisease. Per the record: He has a "past medical history significant for CAD s/p LAD stent 2010, bilateral PE 07/2012 on Xarelto, diabetes type 2, prostate cancer diagnosed in 2018- treated with radiation, OSA on CPAP, hypertension, hyperlipidemia, history of TIAs and stroke, CKD stage II. Steve Brown had a stent to the LAD in 2010, 2012 cath showed patent stent and no significant CAD, cardiac cath in 2015showed moderate disease which was negative by FFR. His most recent echocardiogram in 04/2016 showed normal LV systolic function with EF 60-65 percent, normal wall motion, grade 2 diastolic dysfunction, mild AR, mildly dilated aortic root 39 mm, mild MR and increased thickness of the septum consistent with lipomatous hypertrophy."  Last cath in 2019 showed:  "Acute Mrg lesion is 25% stenosed.  Mid RCA lesion is 25% stenosed.  2nd Mrg lesion is 40% stenosed.  Mid Cx lesion is 40% stenosed.  Previously placed Mid LAD stent (unknown type) is widely patent.  Prox LAD-1 lesion is 25% stenosed.  Prox LAD-2 lesion is 25% stenosed.  The left ventricular systolic function is normal.  LV end diastolic pressure is normal.  The left ventricular ejection fraction is 55-65% by visual estimate.  There is no aortic valve stenosis.  Patent stent. Nonobstructive disease. Would investigate noncardiac causes of fatigue."  He had a TIA affecting the left side of his body in 9/19. MRI was OK.   In Jan 2020,  He reported: " Dizziness: He has seen ENT before,  but he did not want to consider surgery. I discussed sending him to Dr. Benjamine Mola for rehab options. He is agreeable. "  Had some volume overload.  ECho in 7/21 showed: "Left ventricular ejection fraction, by estimation,  is 60 to 65%. The left ventricle has normal function. The left ventricle  has no regional wall motion abnormalities. Left ventricular diastolic  parameters were normal.  2. Right ventricular systolic function is normal. The right ventricular  size is normal. There is mildly elevated pulmonary artery systolic  pressure.  3. The mitral valve is normal in structure. Trivial mitral valve  regurgitation. No evidence of mitral stenosis.  4. The aortic valve is tricuspid. Aortic valve regurgitation is trivial.  Mild aortic valve sclerosis is present, with no evidence of aortic valve  stenosis.  5. Mild pulmonic stenosis.  6. The inferior vena cava is normal in size with greater than 50%  respiratory variability, suggesting right atrial pressure of 3 mmHg. "  Still reports lower extremity edema.  No further chest discomfort since leaving the hospital.  He does feel tired in general.  Denies : Chest pain. Dizziness.  Nitroglycerin use. Orthopnea. Palpitations. Paroxysmal nocturnal dyspnea. Shortness of breath. Syncope.     Past Medical History:  Diagnosis Date  . Abnormal prostate biopsy   . Anticoagulant long-term use    currently xarelto  . BPH with elevated PSA   . CKD (chronic kidney disease), stage II   .  Complication of anesthesia    limted neck rom limited use of left arm due to cva  . Coronary artery disease    CARDIOLOGIST-  DR Irish Lack--  2010-- PCI w/ stenting midLAD  . DDD (degenerative disc disease), lumbar   . Degeneration of cervical intervertebral disc   . Depression   . Dyspnea on exertion   . GERD (gastroesophageal reflux disease)   . Hemiparesis due to cerebral infarction   . History of cerebrovascular accident (CVA) with residual deficit 2002  and 2003--  hemiparisis both sides   per MRI  anterior left frontal lobe, left para midline pons, and inferior cerebullam bilaterally infarcts  . History of pulmonary embolus (PE)    06-30-2012  extensive bilaterally  . History of recurrent TIAs   . History of syncope    hx multiple pre-syncope and syncopal episodes due to vasovagal, orthostatic hypotension, dehydration  . History of TIAs    several since 2002  . Hyperlipidemia   . Hypertension   . Mild atherosclerosis of carotid artery, bilateral    per last duplex 11-04-2014  bilateral ICA 1--39%  . Neuropathy    fingers  . OSA on CPAP    followed by dr dohmeier--  sev. osa w/ AHI 65.9  . Prostate cancer ([place]) dx 2018  . Renal insufficiency   . S/P coronary artery stent placement 2010   stenting to mid LAD  . Simple renal cyst    bilaterally  . Stroke ([place])   . Trigger finger of both hands 11-17-13  . Type 2 diabetes mellitus ([place]) dx 1986   last one A1c 9.2 on 04-26-2016  . Unsteady gait    . Hx prior CVA/TIAs;  . Vertebral artery occlusion, left    chronic    Past Surgical History:  Procedure Laterality Date  . ANTERIOR CERVICAL DECOMP/DISCECTOMY FUSION  2004   C3 -- C6 limited rom  . CARDIAC CATHETERIZATION  06-10-2010   dr Irish Lack   wide patent LAD stent, mid lesion at the origin of the septal prior to the previous stent 40-50%/  normal LVF, ef 55%  . CARDIOVASCULAR STRESS TEST  10-23-2012  dr Irish Lack   normal nuclear perfusion study w/ no ischemia/  normal LV function and wall motion , ef 65%  . CARPAL TUNNEL RELEASE Bilateral   . CATARACT EXTRACTION W/ INTRAOCULAR LENS  IMPLANT, BILATERAL    . CHOLECYSTECTOMY N/A 11/02/2015   Procedure: LAPAROSCOPIC CHOLECYSTECTOMY WITH INTRAOPERATIVE CHOLANGIOGRAM;  Surgeon: Donnie Mesa, MD;  Location: [facility];  Service: General;  Laterality: N/A;  . COLONOSCOPY    . CORONARY ANGIOPLASTY WITH STENT PLACEMENT  02/2008   stenting to mid LAD  . GOLD SEED IMPLANT N/A 11/15/2016    Procedure: GOLD SEED IMPLANT TIMES THREE;  Surgeon: Ardis Hughs, MD;  Location: [facility];  Service: Urology;  Laterality: N/A;  . IR ANGIO INTRA EXTRACRAN SEL COM CAROTID INNOMINATE BILAT MOD SED  06/13/2018  . IR ANGIO VERTEBRAL SEL VERTEBRAL UNI R MOD SED  06/13/2018  . IR US GUIDE VASC ACCESS RIGHT  06/13/2018  . LEFT HEART CATH AND CORONARY ANGIOGRAPHY N/A 05/25/2017   Procedure: LEFT HEART CATH AND CORONARY ANGIOGRAPHY;  Surgeon: Jettie Booze, MD;  Location: [facility];  Service: Cardiovascular;  Laterality: N/A;  . LEFT HEART CATHETERIZATION WITH CORONARY ANGIOGRAM N/A 04/03/2013   Procedure: LEFT HEART CATHETERIZATION WITH CORONARY ANGIOGRAM;  Surgeon: Jettie Booze, MD;  Location: [facility];  Service: Cardiovascular;  Laterality: N/A;  patent mLAD stent  w/ mild disease in remainder LAD and its branches;  mod. focal lesion midLCFx- FFR of lesion was negative for ischemia/  normal LVSF, ef 50%  . lungs  2005   "fluid pumped off lungs"  . NEUROPLASTY / TRANSPOSITION ULNAR NERVE AT ELBOW Right 2004  . PROSTATE BIOPSY N/A 08/31/2016   Procedure: PROSTATE  BIOPSY TRANSRECTAL ULTRASONIC PROSTATE (TUBP);  Surgeon: Ardis Hughs, MD;  Location: [facility];  Service: Urology;  Laterality: N/A;  . SPACE OAR INSTILLATION N/A 11/15/2016   Procedure: SPACE OAR INSTILLATION;  Surgeon: Ardis Hughs, MD;  Location: [facility];  Service: Urology;  Laterality: N/A;  . TRANSTHORACIC ECHOCARDIOGRAM  04/27/2016   severe focal basal LVH, ef 60-65%,  grade 2 diastoilc dysfunction/  mild AR, MR, and TR/  atrial septum lipomatous hypertrophy/  PASP 21mmHg  . UMBILICAL HERNIA REPAIR       Current Outpatient Medications  Medication Sig Dispense Refill  . acetaminophen (TYLENOL) 500 MG tablet Take 1,000 mg by mouth every 6 (six) hours as needed (pain).     . Ascorbic Acid (VITAMIN C PO) Take 1 tablet by mouth daily.    .  cholecalciferol (VITAMIN D) 1000 UNITS tablet Take 1,000 Units by mouth daily.    . diclofenac Sodium (VOLTAREN) 1 % GEL Apply 4 g topically 4 (four) times daily as needed for up to 10 days. 100 g 0  . Echinacea 450 MG CAPS Take 450 mg by mouth daily.     . fluticasone (FLONASE) 50 MCG/ACT nasal spray Place 1 spray into both nostrils 2 (two) times daily as needed for allergies.   0  . furosemide (LASIX) 20 MG tablet Take 1-2 tablets daily as needed for swelling. (Patient taking differently: Take 20 mg by mouth daily. ) 90 tablet 3  . insulin lispro (HUMALOG KWIKPEN) 100 UNIT/ML KiwkPen Inject 6-10 Units into the skin 3 (three) times daily with meals.    Marland Kitchen JANUVIA 50 MG tablet Take 50 mg by mouth daily.    Marland Kitchen losartan (COZAAR) 100 MG tablet Take 100 mg by mouth daily.    . Multiple Vitamin (MULTIVITAMIN WITH MINERALS) TABS tablet Take 1 tablet by mouth daily.    . nitroGLYCERIN (NITROSTAT) 0.4 MG SL tablet Place 1 tablet (0.4 mg total) under the tongue every 5 (five) minutes as needed for chest pain. 25 tablet 12  . predniSONE (DELTASONE) 10 MG tablet Take 4 tablets (40 mg total) by mouth daily with breakfast for 3 days, THEN 2 tablets (20 mg total) daily with breakfast for 3 days, THEN 1 tablet (10 mg total) daily with breakfast for 3 days. 21 tablet 0  . pregabalin (LYRICA) 300 MG capsule Take 1 capsule (300 mg total) by mouth 2 (two) times daily. 180 capsule 3  . rivaroxaban (XARELTO) 20 MG TABS tablet Take 20 mg by mouth daily.     Tyler Aas FLEXTOUCH 100 UNIT/ML SOPN FlexTouch Pen Inject 26 Units into the skin every morning.     . valACYclovir (VALTREX) 500 MG tablet Take 500 mg by mouth daily.    Marland Kitchen zinc gluconate 50 MG tablet Take 50 mg by mouth daily.     No current facility-administered medications for this visit.    Allergies:   Other, Phenergan [promethazine], and Tramadol    Social History:  The patient  reports that he has never smoked. He has never used smokeless tobacco. He reports  that  he does not drink alcohol and does not use drugs.   Family History:  The patient's family history includes Aneurysm in his mother; Cancer in his father; Dementia in his sister; Stroke in his brother.    ROS:  Please see the history of present illness.   Otherwise, review of systems are positive for leg swelling.   All other systems are reviewed and negative.    PHYSICAL EXAM: VS:  BP (!) 112/52   Pulse 67   Ht 5\' 5"  (1.651 m)   Wt 206 lb 3.2 oz (93.5 kg)   SpO2 97%   BMI 34.31 kg/m  , BMI Body mass index is 34.31 kg/m. GEN: Well nourished, well developed, in no acute distress  HEENT: normal  Neck: no JVD, carotid bruits, or masses Cardiac: RRR; no murmurs, rubs, or gallops,no edema  Respiratory:  clear to auscultation bilaterally, normal work of breathing GI: soft, nontender, nondistended, + BS MS: no deformity or atrophy  Skin: warm and dry, no rash Neuro:  Strength and sensation are intact Psych: euthymic mood, full affect   EKG:   The ekg ordered 8/3 demonstrates normal sinus rhythm, no ST segment changes   Recent Labs: 08/06/2019: NT-Pro BNP 93 08/11/2019: B Natriuretic Peptide 70.3 08/12/2019: ALT 19; Magnesium 1.7; TSH 1.536 08/13/2019: Hemoglobin 12.9; Platelets 222 08/16/2019: BUN 36; Creatinine, Ser 1.50; Potassium 4.5; Sodium 137   Lipid Panel    Component Value Date/Time   CHOL 203 (H) 08/12/2019 0424   TRIG 107 08/12/2019 0424   HDL 44 08/12/2019 0424   CHOLHDL 4.6 08/12/2019 0424   VLDL 21 08/12/2019 0424   LDLCALC 138 (H) 08/12/2019 0424     Other studies Reviewed: Additional studies/ records that were reviewed today with results demonstrating: Hospital records reviewed.   ASSESSMENT AND PLAN:  1. CAD: No angina.  Negative stress test in 8/21. LDL 138 in 8/21. THinks he is taking Crestor, but not on his list.  WiIl check with pharmacy to check meds for hyperlipidemia.- was on rosuvastatin 10 mg daily in Jan 2021. 2. LE edema: Elevate legs.  Use  compression stockings. Low dose Lasix.  He did poorly with high-dose Lasix in the hospital due to acute renal failure.  We will check electrolytes today.  We will have to manage his volume overload conservatively.  Objective measures of his volume status have not shown volume overload.  BNP has been normal.  Chest x-ray has been negative.  I think conservative treatment for venous insufficiency is the best treatment going forward. 3. DM: A1C 7.5. 4. HTN: The current medical regimen is effective;  continue present plan and medications. 5. OSA: Using CPAP 6. Anticoagulated: No bleeding issues with the Xarelto. 7. Lightheadedness: Seen by Dr., Benjamine Mola.  8. Generalized fatigue: I think this will improve with more activity.   Current medicines are reviewed at length with the patient today.  The patient concerns regarding his medicines were addressed.  The following changes have been made:  No change  Labs/ tests ordered today include:  No orders of the defined types were placed in this encounter.   Recommend 150 minutes/week of aerobic exercise Low fat, low carb, high fiber diet recommended  Disposition:   FU in 6 months   Signed, Larae Grooms, MD  08/22/2019 11:48 AM    [facility] [address], [place], [place]  [address] Phone: [phone]; Fax: [phone]

## 2019-08-22 ENCOUNTER — Ambulatory Visit (INDEPENDENT_AMBULATORY_CARE_PROVIDER_SITE_OTHER): Payer: Medicare Other | Admitting: Interventional Cardiology

## 2019-08-22 ENCOUNTER — Other Ambulatory Visit: Payer: Self-pay

## 2019-08-22 ENCOUNTER — Telehealth: Payer: Self-pay

## 2019-08-22 ENCOUNTER — Encounter: Payer: Self-pay | Admitting: Interventional Cardiology

## 2019-08-22 VITALS — BP 112/52 | HR 67 | Ht 65.0 in | Wt 206.2 lb

## 2019-08-22 DIAGNOSIS — E1159 Type 2 diabetes mellitus with other circulatory complications: Secondary | ICD-10-CM | POA: Diagnosis not present

## 2019-08-22 DIAGNOSIS — G4733 Obstructive sleep apnea (adult) (pediatric): Secondary | ICD-10-CM | POA: Diagnosis not present

## 2019-08-22 DIAGNOSIS — I10 Essential (primary) hypertension: Secondary | ICD-10-CM | POA: Diagnosis not present

## 2019-08-22 DIAGNOSIS — I25118 Atherosclerotic heart disease of native coronary artery with other forms of angina pectoris: Secondary | ICD-10-CM

## 2019-08-22 DIAGNOSIS — R0609 Other forms of dyspnea: Secondary | ICD-10-CM

## 2019-08-22 DIAGNOSIS — Z7901 Long term (current) use of anticoagulants: Secondary | ICD-10-CM | POA: Diagnosis not present

## 2019-08-22 DIAGNOSIS — R0600 Dyspnea, unspecified: Secondary | ICD-10-CM

## 2019-08-22 MED ORDER — ROSUVASTATIN CALCIUM 10 MG PO TABS
10.0000 mg | ORAL_TABLET | Freq: Every day | ORAL | 3 refills | Status: DC
Start: 2019-08-22 — End: 2020-05-28

## 2019-08-22 NOTE — Telephone Encounter (Signed)
Left message for patient to call back.  Crestor not on med list and LDL 138. Per patient's pharmacy he has not filled it since 12/2018. Per [name] patient needs to restart crestor 10 mg QD and repeat LIPIDS and LFTs in 3 months.

## 2019-08-22 NOTE — Telephone Encounter (Signed)
Called and made patient aware of Dr. Hassell Done recommendations to restart crestor 10 mg QD. Rx sent to preferred pharmacy. LIPIDS and LIFTS scheduled for 11/15.

## 2019-08-22 NOTE — Telephone Encounter (Signed)
Follow up   Pt is returning call    

## 2019-08-22 NOTE — Patient Instructions (Signed)
Medication Instructions:  Your physician recommends that you continue on your current medications as directed. Please refer to the Current Medication list given to you today.  *If you need a refill on your cardiac medications before your next appointment, please call your pharmacy*   Lab Work: TODAY: BMET  If you have labs (blood work) drawn today and your tests are completely normal, you will receive your results only by: Marland Kitchen MyChart Message (if you have MyChart) OR . A paper copy in the mail If you have any lab test that is abnormal or we need to change your treatment, we will call you to review the results.   Testing/Procedures: None  Follow-Up: At [facility] you and your health needs are our priority.  As part of our continuing mission to provide you with exceptional heart care, we have created designated Provider Care Teams.  These Care Teams include your primary Cardiologist (physician) and Advanced Practice Providers (APPs -  Physician Assistants and Nurse Practitioners) who all work together to provide you with the care you need, when you need it.  We recommend signing up for the patient portal called "MyChart".  Sign up information is provided on this After Visit Summary.  MyChart is used to connect with patients for Virtual Visits (Telemedicine).  Patients are able to view lab/test results, encounter notes, upcoming appointments, etc.  Non-urgent messages can be sent to your provider as well.   To learn more about what you can do with MyChart, go to [email or web address].    Your next appointment:   6 month(s)  The format for your next appointment:   In Person  Provider:   You may see Larae Grooms, MD or one of the following Advanced Practice Providers on your designated Care Team:    Melina Copa, PA-C  Ermalinda Barrios, PA-C    Other Instructions Elevate legs and wear compression stockings

## 2019-08-23 LAB — BASIC METABOLIC PANEL
BUN/Creatinine Ratio: 21 (ref 10–24)
BUN: 29 mg/dL — ABNORMAL HIGH (ref 8–27)
CO2: 25 mmol/L (ref 20–29)
Calcium: 8.7 mg/dL (ref 8.6–10.2)
Chloride: 99 mmol/L (ref 96–106)
Creatinine, Ser: 1.36 mg/dL — ABNORMAL HIGH (ref 0.76–1.27)
GFR calc Af Amer: 58 mL/min/{1.73_m2} — ABNORMAL LOW (ref >59)
GFR calc non Af Amer: 50 mL/min/{1.73_m2} — ABNORMAL LOW (ref >59)
Glucose: 153 mg/dL — ABNORMAL HIGH (ref 65–99)
Potassium: 4.4 mmol/L (ref 3.5–5.2)
Sodium: 135 mmol/L (ref 134–144)

## 2019-08-25 ENCOUNTER — Other Ambulatory Visit: Payer: Self-pay

## 2019-08-25 DIAGNOSIS — M5417 Radiculopathy, lumbosacral region: Secondary | ICD-10-CM | POA: Diagnosis not present

## 2019-08-25 DIAGNOSIS — M5136 Other intervertebral disc degeneration, lumbar region: Secondary | ICD-10-CM | POA: Diagnosis not present

## 2019-08-26 ENCOUNTER — Telehealth: Payer: Self-pay | Admitting: Interventional Cardiology

## 2019-08-26 NOTE — Telephone Encounter (Signed)
Follow up:   Patient calling to report BP from yesterday was 130/70. Please call patient.

## 2019-08-26 NOTE — Telephone Encounter (Signed)
Called pt's pharmacy and the pharmacy tech stated that pt just picked up his medication rosuvastatin, 90 day supply. I called the pt to informed him that his pharmacy just gave him a 90 day supply of his medication rosuvastatin. Pt did not know which medication that he needed. I advised the pt that once he found out which medication needed to be refilled, to give our office a call back. Pt verbalized understanding.

## 2019-08-26 NOTE — Telephone Encounter (Signed)
*  STAT* If patient is at the pharmacy, call can be transferred to refill team.   1. Which medications need to be refilled? (please list name of each medication and dose if known) rosuvastatin (CRESTOR) 10 MG tablet  2. Which pharmacy/location (including street and city if local pharmacy) is medication to be sent to? [id number] - [place], [place] - [address]  3. Do they need a 30 day or 90 day supply? 90 day   Patient is out of medication. Patient states the pharmacy has not received refill.

## 2019-08-26 NOTE — Patient Outreach (Signed)
[facility]) Care Management  08/26/2019  Steve Brown October 20, 1940 [id number]   New referral:  Hospital discharge on 08/17/2019 MD office does transition of care.  Placed call to patient who identified himself. Patient reports that he is short of breath from walking back from the mailbox.  Reports that he has an appointment to go to the pain management center today.  [facility] an [facility] to his appointments.  Reports that he has all his medications and takes them as prescribed.  Reports that he has good and bad days. He has dizziness, then feels good, then feels tired and weak.  Repots that swelling has decreased in his legs.  Denies any chest pain. Reports his daughter assist him with meals.   PLAN: patient has agreed to telephone follow up on August 30th.  Tomasa Rand, RN, BSN, CEN [facility] [employer] [phone]

## 2019-08-26 NOTE — Telephone Encounter (Signed)
Spoke with the patient who states that he was feeling dizzy yesterday. [place] asked him to call back with his BP at his visit when he went to get injection for his back. He states that BP was 130/70. He states that today he is feeling much better. He also reports that he is going to the store today to get himself a BP cuff to keep up with his readings at home.

## 2019-08-30 ENCOUNTER — Other Ambulatory Visit: Payer: Medicare Other

## 2019-08-30 ENCOUNTER — Other Ambulatory Visit: Payer: Self-pay

## 2019-08-30 ENCOUNTER — Ambulatory Visit
Admission: RE | Admit: 2019-08-30 | Discharge: 2019-08-30 | Disposition: A | Discharge status: Home / Self Care | Payer: Medicare Other | Source: Ambulatory Visit | Attending: Physician Assistant | Admitting: Physician Assistant

## 2019-08-30 DIAGNOSIS — M4802 Spinal stenosis, cervical region: Secondary | ICD-10-CM | POA: Diagnosis not present

## 2019-08-30 DIAGNOSIS — R531 Weakness: Secondary | ICD-10-CM

## 2019-08-30 DIAGNOSIS — M549 Dorsalgia, unspecified: Secondary | ICD-10-CM

## 2019-09-03 ENCOUNTER — Other Ambulatory Visit: Payer: Self-pay | Admitting: Pain Medicine

## 2019-09-03 ENCOUNTER — Emergency Department (HOSPITAL_BASED_OUTPATIENT_CLINIC_OR_DEPARTMENT_OTHER): Payer: Medicare Other

## 2019-09-03 ENCOUNTER — Emergency Department (HOSPITAL_BASED_OUTPATIENT_CLINIC_OR_DEPARTMENT_OTHER)
Admission: EM | Admit: 2019-09-03 | Discharge: 2019-09-03 | Disposition: A | Discharge status: Home / Self Care | Payer: Medicare Other | Attending: Emergency Medicine | Admitting: Emergency Medicine

## 2019-09-03 ENCOUNTER — Other Ambulatory Visit: Payer: Self-pay

## 2019-09-03 ENCOUNTER — Encounter (HOSPITAL_BASED_OUTPATIENT_CLINIC_OR_DEPARTMENT_OTHER): Payer: Self-pay | Admitting: Emergency Medicine

## 2019-09-03 DIAGNOSIS — W19XXXA Unspecified fall, initial encounter: Secondary | ICD-10-CM | POA: Insufficient documentation

## 2019-09-03 DIAGNOSIS — I129 Hypertensive chronic kidney disease with stage 1 through stage 4 chronic kidney disease, or unspecified chronic kidney disease: Secondary | ICD-10-CM | POA: Diagnosis not present

## 2019-09-03 DIAGNOSIS — Y939 Activity, unspecified: Secondary | ICD-10-CM | POA: Diagnosis not present

## 2019-09-03 DIAGNOSIS — E1140 Type 2 diabetes mellitus with diabetic neuropathy, unspecified: Secondary | ICD-10-CM | POA: Insufficient documentation

## 2019-09-03 DIAGNOSIS — M545 Low back pain, unspecified: Secondary | ICD-10-CM

## 2019-09-03 DIAGNOSIS — M4316 Spondylolisthesis, lumbar region: Secondary | ICD-10-CM | POA: Diagnosis not present

## 2019-09-03 DIAGNOSIS — M542 Cervicalgia: Secondary | ICD-10-CM | POA: Diagnosis not present

## 2019-09-03 DIAGNOSIS — Z794 Long term (current) use of insulin: Secondary | ICD-10-CM | POA: Diagnosis not present

## 2019-09-03 DIAGNOSIS — Y999 Unspecified external cause status: Secondary | ICD-10-CM | POA: Insufficient documentation

## 2019-09-03 DIAGNOSIS — S46812A Strain of other muscles, fascia and tendons at shoulder and upper arm level, left arm, initial encounter: Secondary | ICD-10-CM | POA: Diagnosis not present

## 2019-09-03 DIAGNOSIS — M5033 Other cervical disc degeneration, cervicothoracic region: Secondary | ICD-10-CM | POA: Diagnosis not present

## 2019-09-03 DIAGNOSIS — Z043 Encounter for examination and observation following other accident: Secondary | ICD-10-CM | POA: Diagnosis not present

## 2019-09-03 DIAGNOSIS — S0990XA Unspecified injury of head, initial encounter: Secondary | ICD-10-CM

## 2019-09-03 DIAGNOSIS — M79605 Pain in left leg: Secondary | ICD-10-CM

## 2019-09-03 DIAGNOSIS — Y929 Unspecified place or not applicable: Secondary | ICD-10-CM | POA: Insufficient documentation

## 2019-09-03 DIAGNOSIS — C61 Malignant neoplasm of prostate: Secondary | ICD-10-CM | POA: Diagnosis not present

## 2019-09-03 DIAGNOSIS — M5126 Other intervertebral disc displacement, lumbar region: Secondary | ICD-10-CM | POA: Diagnosis not present

## 2019-09-03 DIAGNOSIS — M48061 Spinal stenosis, lumbar region without neurogenic claudication: Secondary | ICD-10-CM | POA: Diagnosis not present

## 2019-09-03 DIAGNOSIS — S199XXA Unspecified injury of neck, initial encounter: Secondary | ICD-10-CM | POA: Diagnosis not present

## 2019-09-03 DIAGNOSIS — M50323 Other cervical disc degeneration at C6-C7 level: Secondary | ICD-10-CM | POA: Diagnosis not present

## 2019-09-03 DIAGNOSIS — M47816 Spondylosis without myelopathy or radiculopathy, lumbar region: Secondary | ICD-10-CM | POA: Diagnosis not present

## 2019-09-03 DIAGNOSIS — E041 Nontoxic single thyroid nodule: Secondary | ICD-10-CM | POA: Diagnosis not present

## 2019-09-03 DIAGNOSIS — Z7901 Long term (current) use of anticoagulants: Secondary | ICD-10-CM | POA: Diagnosis not present

## 2019-09-03 DIAGNOSIS — N183 Chronic kidney disease, stage 3 unspecified: Secondary | ICD-10-CM | POA: Diagnosis not present

## 2019-09-03 DIAGNOSIS — I2510 Atherosclerotic heart disease of native coronary artery without angina pectoris: Secondary | ICD-10-CM | POA: Diagnosis not present

## 2019-09-03 DIAGNOSIS — I700 Atherosclerosis of aorta: Secondary | ICD-10-CM | POA: Diagnosis not present

## 2019-09-03 DIAGNOSIS — E1122 Type 2 diabetes mellitus with diabetic chronic kidney disease: Secondary | ICD-10-CM | POA: Insufficient documentation

## 2019-09-03 DIAGNOSIS — M1711 Unilateral primary osteoarthritis, right knee: Secondary | ICD-10-CM | POA: Diagnosis not present

## 2019-09-03 DIAGNOSIS — R519 Headache, unspecified: Secondary | ICD-10-CM | POA: Diagnosis not present

## 2019-09-03 LAB — CBG MONITORING, ED: Glucose-Capillary: 71 mg/dL (ref 70–99)

## 2019-09-03 MED ORDER — ACETAMINOPHEN 500 MG PO TABS
1000.0000 mg | ORAL_TABLET | Freq: Once | ORAL | Status: AC
  Administered 2019-09-03: 1000 mg via ORAL
  Filled 2019-09-03: qty 2

## 2019-09-03 MED ORDER — OXYCODONE HCL 5 MG PO TABS
2.5000 mg | ORAL_TABLET | Freq: Once | ORAL | Status: AC
  Administered 2019-09-03: 2.5 mg via ORAL
  Filled 2019-09-03: qty 1

## 2019-09-03 MED ORDER — DICLOFENAC SODIUM 1 % EX GEL: 4.0000 g | Freq: Four times a day (QID) | CUTANEOUS | 0 refills | Status: AC

## 2019-09-03 NOTE — ED Provider Notes (Signed)
[facility] EMERGENCY DEPARTMENT Provider Note   CSN: [id number] Arrival date & time: 09/03/19  1216     History Chief Complaint  Patient presents with  . Fall    Steve Brown is a 79 y.o. male.  79 yo M with a chief complaint of a fall.  Patient states that his right knee has been giving him trouble and has been causing him severe pain at times and caused him to fall to the ground once or twice.  Patient had an episode like this last night and collapsed to the ground and struck the back of his head.  Complaining of pain to the low back and not really of pain to the right knee.  Pain to the left shoulder.  He called his family doctor who suggested he come here for evaluation.  He is on Xarelto.  The history is provided by the patient.  Fall This is a new problem. The current episode started yesterday. The problem occurs constantly. The problem has not changed since onset.Associated symptoms include headaches. Pertinent negatives include no chest pain, no abdominal pain and no shortness of breath. The symptoms are aggravated by bending and twisting. Nothing relieves the symptoms. He has tried nothing for the symptoms. The treatment provided no relief.       Past Medical History:  Diagnosis Date  . Abnormal prostate biopsy   . Anticoagulant long-term use    currently xarelto  . BPH with elevated PSA   . CKD (chronic kidney disease), stage II   . Complication of anesthesia    limted neck rom limited use of left arm due to cva  . Coronary artery disease    CARDIOLOGIST-  DR Irish Lack--  2010-- PCI w/ stenting midLAD  . DDD (degenerative disc disease), lumbar   . Degeneration of cervical intervertebral disc   . Depression   . Dyspnea on exertion   . GERD (gastroesophageal reflux disease)   . Hemiparesis due to cerebral infarction   . History of cerebrovascular accident (CVA) with residual deficit 2002 and 2003--  hemiparisis both sides   per MRI  anterior left  frontal lobe, left para midline pons, and inferior cerebullam bilaterally infarcts  . History of pulmonary embolus (PE)    06-30-2012  extensive bilaterally  . History of recurrent TIAs   . History of syncope    hx multiple pre-syncope and syncopal episodes due to vasovagal, orthostatic hypotension, dehydration  . History of TIAs    several since 2002  . Hyperlipidemia   . Hypertension   . Mild atherosclerosis of carotid artery, bilateral    per last duplex 11-04-2014  bilateral ICA 1--39%  . Neuropathy    fingers  . OSA on CPAP    followed by dr dohmeier--  sev. osa w/ AHI 65.9  . Prostate cancer ([place]) dx 2018  . Renal insufficiency   . S/P coronary artery stent placement 2010   stenting to mid LAD  . Simple renal cyst    bilaterally  . Stroke ([place])   . Trigger finger of both hands 11-17-13  . Type 2 diabetes mellitus ([place]) dx 1986   last one A1c 9.2 on 04-26-2016  . Unsteady gait    . Hx prior CVA/TIAs;  . Vertebral artery occlusion, left    chronic    Patient Active Problem List   Diagnosis Date Noted  . Lumbar spinal stenosis 08/15/2019  . Nonspecific chest pain 08/11/2019  . Shortness of breath   . Multi-infarct  dementia without behavioral disturbance ([place]) 10/21/2018  . Nocturnal hypoxemia 10/21/2018  . Radiation therapy complication 35/45/6256  . Primary prostate cancer ([place]) 07/31/2017  . Anemia 07/31/2017  . Vertigo due to cerebrovascular disease 07/31/2017  . Poor compliance with CPAP treatment 07/31/2017  . Absolute anemia 05/16/2017  . Avitaminosis D 05/16/2017  . Benign essential HTN 05/16/2017  . Benign prostatic hypertrophy without urinary obstruction 05/16/2017  . Clinical depression 05/16/2017  . CN (constipation) 05/16/2017  . Current drug use 05/16/2017  . Diabetic neuropathy ([place]) 05/16/2017  . Genital herpes 05/16/2017  . Infarction of lung due to iatrogenic pulmonary embolism ([place]) 05/16/2017  . Sciatica associated with disorder of lumbar  spine 05/16/2017  . Arteriosclerosis of coronary artery 05/16/2017  . Artery disease, cerebral 05/16/2017  . Apnea, sleep 05/16/2017  . Arthralgia of hip or thigh 05/16/2017  . Malignant neoplasm of prostate ([place]) 09/28/2016  . Vascular dementia in remission ([place]) 09/28/2016  . Remote history of stroke 09/28/2016  . Acute kidney injury ([place]) 05/18/2016  . Dehydration 05/18/2016  . Near syncope 05/18/2016  . Orthostatic hypotension 05/18/2016  . CKD (chronic kidney disease), stage II 04/26/2016  . UTI (urinary tract infection) 04/26/2016  . Encounter for counseling on use of CPAP 11/18/2015  . Chronic cholecystitis with calculus 11/02/2015  . Stroke, 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[place]) 04/22/2015  . TIA (transient ischemic attack) 11/03/2014  . OSA on CPAP 05/18/2014  . White matter disease 08/14/2013  . OSA (obstructive sleep apnea) 08/14/2013  . Abnormal x-ray of temporomandibular joint 05/30/2013  . Chronic infection of sinus 05/30/2013  . Cough 05/30/2013  . Fatigue 05/30/2013  . Unsteady gait 05/19/2013  . Combined fat and carbohydrate induced hyperlipemia 04/24/2013  . Syncope 03/20/2013  . Angina pectoris ([place]) 10/08/2012  . Hypertension   . Stroke ([place])   . History of TIAs   . Lumbago   . Other and unspecified hyperlipidemia   . Personal history of unspecified circulatory disease   . Unspecified fall   . Pain in joint, multiple sites   . Degeneration of cervical intervertebral disc   . Unspecified cardiovascular disease   . History of pulmonary embolism: June 2014,  Takes Xarelto 07/01/2012    Class: History of  . Hemiparesis ([place]) 07/18/2011  . Diabetes mellitus type 2 with complications ([place]) 38/93/7342  . HTN (hypertension) 07/18/2011  . CAD in native artery 07/18/2011  . History of recurrent TIAs 07/18/2011    Past Surgical History:  Procedure Laterality Date  . ANTERIOR CERVICAL DECOMP/DISCECTOMY FUSION  2004   C3 -- C6 limited rom  . CARDIAC CATHETERIZATION   06-10-2010   dr Irish Lack   wide patent LAD stent, mid lesion at the origin of the septal prior to the previous stent 40-50%/  normal LVF, ef 55%  . CARDIOVASCULAR STRESS TEST  10-23-2012  dr Irish Lack   normal nuclear perfusion study w/ no ischemia/  normal LV function and wall motion , ef 65%  . CARPAL TUNNEL RELEASE Bilateral   . CATARACT EXTRACTION W/ INTRAOCULAR LENS  IMPLANT, BILATERAL    . CHOLECYSTECTOMY N/A 11/02/2015   Procedure: LAPAROSCOPIC CHOLECYSTECTOMY WITH INTRAOPERATIVE CHOLANGIOGRAM;  Surgeon: Donnie Mesa, MD;  Location: [facility];  Service: General;  Laterality: N/A;  . COLONOSCOPY    . CORONARY ANGIOPLASTY WITH STENT PLACEMENT  02/2008   stenting to mid LAD  . GOLD SEED IMPLANT N/A 11/15/2016   Procedure: GOLD SEED IMPLANT TIMES THREE;  Surgeon: Ardis Hughs, MD;  Location[facility];  Service: Urology;  Laterality: N/A;  . IR ANGIO INTRA EXTRACRAN SEL COM CAROTID INNOMINATE BILAT MOD SED  06/13/2018  . IR ANGIO VERTEBRAL SEL VERTEBRAL UNI R MOD SED  06/13/2018  . IR US GUIDE VASC ACCESS RIGHT  06/13/2018  . LEFT HEART CATH AND CORONARY ANGIOGRAPHY N/A 05/25/2017   Procedure: LEFT HEART CATH AND CORONARY ANGIOGRAPHY;  Surgeon: Jettie Booze, MD;  Location: [facility];  Service: Cardiovascular;  Laterality: N/A;  . LEFT HEART CATHETERIZATION WITH CORONARY ANGIOGRAM N/A 04/03/2013   Procedure: LEFT HEART CATHETERIZATION WITH CORONARY ANGIOGRAM;  Surgeon: Jettie Booze, MD;  Location: [facility] CATH LAB;  Service: Cardiovascular;  Laterality: N/A;  patent mLAD stent  w/ mild disease in remainder LAD and its branches;  mod. focal lesion midLCFx- FFR of lesion was negative for ischemia/  normal LVSF, ef 50%  . lungs  2005   "fluid pumped off lungs"  . NEUROPLASTY / TRANSPOSITION ULNAR NERVE AT ELBOW Right 2004  . PROSTATE BIOPSY N/A 08/31/2016   Procedure: PROSTATE  BIOPSY TRANSRECTAL ULTRASONIC PROSTATE (TUBP);  Surgeon: Ardis Hughs, MD;   Location: [facility];  Service: Urology;  Laterality: N/A;  . SPACE OAR INSTILLATION N/A 11/15/2016   Procedure: SPACE OAR INSTILLATION;  Surgeon: Ardis Hughs, MD;  Location: [facility];  Service: Urology;  Laterality: N/A;  . TRANSTHORACIC ECHOCARDIOGRAM  04/27/2016   severe focal basal LVH, ef 60-65%,  grade 2 diastoilc dysfunction/  mild AR, MR, and TR/  atrial septum lipomatous hypertrophy/  PASP 110mmHg  . UMBILICAL HERNIA REPAIR         Family History  Problem Relation Age of Onset  . Aneurysm Mother   . Cancer Father        unknown either pancreatic or prostate  . Stroke Brother   . Dementia Sister   . Heart attack Neg Hx     Social History   Tobacco Use  . Smoking status: Never Smoker  . Smokeless tobacco: Never Used  Vaping Use  . Vaping Use: Never used  Substance Use Topics  . Alcohol use: No    Alcohol/week: 0.0 standard drinks  . Drug use: No    Home Medications Prior to Admission medications   Medication Sig Start Date End Date Taking? Authorizing Provider  acetaminophen (TYLENOL) 500 MG tablet Take 1,000 mg by mouth every 6 (six) hours as needed (pain).    Yes [provider]  Ascorbic Acid (VITAMIN C PO) Take 1 tablet by mouth daily.   Yes [provider]  cholecalciferol (VITAMIN D) 1000 UNITS tablet Take 1,000 Units by mouth daily.   Yes [provider]  Echinacea 450 MG CAPS Take 450 mg by mouth daily.    Yes [provider]  fluticasone (FLONASE) 50 MCG/ACT nasal spray Place 1 spray into both nostrils 2 (two) times daily as needed for allergies.  09/26/16  Yes [provider]  furosemide (LASIX) 20 MG tablet Take 1-2 tablets daily as needed for swelling. Patient taking differently: Take 20 mg by mouth daily.  08/08/19  Yes Jettie Booze, MD  insulin lispro (HUMALOG KWIKPEN) 100 UNIT/ML KiwkPen Inject 6-10 Units into the skin 3 (three) times daily with meals.   Yes  [provider]  JANUVIA 50 MG tablet Take 50 mg by mouth daily. 08/22/19  Yes [provider]  losartan (COZAAR) 100 MG tablet Take 100 mg by mouth daily. 03/29/18  Yes [provider]  Multiple Vitamin (MULTIVITAMIN WITH MINERALS) TABS tablet Take 1 tablet by mouth daily.   Yes [provider]  nitroGLYCERIN (NITROSTAT) 0.4 MG SL tablet Place 1 tablet (0.4 mg total) under the tongue every 5 (five) minutes as needed for chest pain. 12/07/15  Yes Jettie Booze, MD  pregabalin (LYRICA) 300 MG capsule Take 1 capsule (300 mg total) by mouth 2 (two) times daily. 07/26/12  Yes Dohmeier, Asencion Partridge, MD  rivaroxaban (XARELTO) 20 MG TABS tablet Take 20 mg by mouth daily.    Yes [provider]  rosuvastatin (CRESTOR) 10 MG tablet Take 1 tablet (10 mg total) by mouth daily. 08/22/19  Yes Jettie Booze, MD  TRESIBA FLEXTOUCH 100 UNIT/ML SOPN FlexTouch Pen Inject 26 Units into the skin every morning.  05/11/18  Yes [provider]  valACYclovir (VALTREX) 500 MG tablet Take 500 mg by mouth daily.   Yes [provider]  zinc gluconate 50 MG tablet Take 50 mg by mouth daily.   Yes [provider]  diclofenac Sodium (VOLTAREN) 1 % GEL Apply 4 g topically 4 (four) times daily. 09/03/19   Deno Etienne, DO    Allergies    Other, Phenergan [promethazine], and Tramadol  Review of Systems   Review of Systems  Constitutional: Negative for chills and fever.  HENT: Negative for congestion and facial swelling.   Eyes: Negative for discharge and visual disturbance.  Respiratory: Negative for shortness of breath.   Cardiovascular: Negative for chest pain and palpitations.  Gastrointestinal: Negative for abdominal pain, diarrhea and vomiting.  Musculoskeletal: Positive for back pain and neck pain. Negative for arthralgias and myalgias.  Skin: Negative for color change and rash.  Neurological: Positive for headaches. Negative for tremors and  syncope.  Psychiatric/Behavioral: Negative for confusion and dysphoric mood.    Physical Exam Updated Vital Signs BP (!) 161/82 (BP Location: Right Arm)   Pulse (!) 53   Temp 98.6 F (37 C) (Oral)   Resp 18   Ht 5\' 5"  (1.651 m)   Wt 92.6 kg   SpO2 100%   BMI 33.98 kg/m   Physical Exam Vitals and nursing note reviewed.  Constitutional:      Appearance: He is well-developed.  HENT:     Head: Normocephalic and atraumatic.  Eyes:     Pupils: Pupils are equal, round, and reactive to light.  Neck:     Vascular: No JVD.  Cardiovascular:     Rate and Rhythm: Normal rate and regular rhythm.     Heart sounds: No murmur heard.  No friction rub. No gallop.   Pulmonary:     Effort: No respiratory distress.     Breath sounds: No wheezing.  Abdominal:     General: There is no distension.     Tenderness: There is no guarding or rebound.  Musculoskeletal:        General: Normal range of motion.     Cervical back: Normal range of motion and neck supple.     Comments: Tenderness to the left trapezius muscle belly.  No obvious midline C-spine tenderness.  Mild diffuse paraspinal tenderness to the low back.  Right knee without effusion.  No ligamentous laxity negative McMurray's.  Palpated from head to toe without any other noted areas of bony tenderness.  Skin:    Coloration: Skin is not pale.     Findings: No rash.  Neurological:     Mental Status: He is alert and oriented to person, place, and time.  Psychiatric:        Behavior: Behavior normal.     ED Results / Procedures / Treatments   Labs (all labs ordered are listed, but only abnormal results are displayed) Labs Reviewed  CBG MONITORING, ED    EKG None  Radiology DG Chest 2 View  Result Date: 09/03/2019 CLINICAL DATA:  Fall EXAM: CHEST - 2 VIEW COMPARISON:  08/11/2019 FINDINGS: No new consolidation or edema. No pleural effusion. Stable cardiomediastinal contours. No acute osseous abnormality. IMPRESSION: No acute  process in the chest. Electronically Signed   By: Macy Mis M.D.   On: 09/03/2019 14:39   DG Lumbar Spine Complete  Result Date: 09/03/2019 CLINICAL DATA:  Fall, fall backwards in shower. EXAM: LUMBAR SPINE - COMPLETE 4+ VIEW COMPARISON:  11/12/2016 FINDINGS: Horizontal lucency in posterior elements of L2-3. Also with lucency inw L3 inferior facet suggested on the current study. No sign of subluxation at this level. Below this level there is increased anterolisthesis of L4 on L5 since previous imaging, most recent comparison is made with CT of August 13, 2019 though there was anterolisthesis on the 2018 exam that was similar. Perhaps this is related to patient position. Extensive spinal degenerative changes throughout the lumbar spine similar to recent CT. IMPRESSION: 1. Horizontal lucency in the posterior elements of L2-3. Correlate with any acute pain in this CT may be helpful for further assessment to exclude the possibility of subtle fracture within facets at this level. 2. Multilevel degenerative change with anterolisthesis of L4 on L5 that is similar to prior radiograph but appears increased compared to recent CT, perhaps related to patient positioning. Electronically Signed   By: Zetta Bills M.D.   On: 09/03/2019 14:48   CT Head Wo Contrast  Result Date: 09/03/2019 CLINICAL DATA:  Fall with posterior head pain EXAM: CT HEAD WITHOUT CONTRAST TECHNIQUE: Contiguous axial images were obtained from the base of the skull through the vertex without intravenous contrast. COMPARISON:  03/20/2019 FINDINGS: Brain: No evidence of acute infarction, hemorrhage, hydrocephalus, extra-axial collection or mass lesion/mass effect. Scattered low-density changes within the periventricular and subcortical white matter compatible with chronic microvascular ischemic change. Mild diffuse cerebral volume loss. Vascular: Atherosclerotic calcifications involving the large vessels of the skull base. No unexpected  hyperdense vessel. Skull: Normal. Negative for fracture or focal lesion. Sinuses/Orbits: Chronic opacification of the right sphenoid sinus and posterior left ethmoid air cells, unchanged from prior. Unchanged bilateral exophthalmos. Other: None. IMPRESSION: 1. No acute intracranial findings. 2. Chronic microvascular ischemic change and cerebral volume loss. 3. Chronic right sphenoid and posterior left ethmoid sinus disease. Electronically Signed   By: Davina Poke D.O.   On: 09/03/2019 14:01   CT Cervical Spine Wo Contrast  Result Date: 09/03/2019 CLINICAL DATA:  Fall, neck trauma EXAM: CT CERVICAL SPINE WITHOUT CONTRAST TECHNIQUE: Multidetector CT imaging of the cervical spine was performed without intravenous contrast. Multiplanar CT image reconstructions were also generated. COMPARISON:  02/03/2008 CT, 08/30/2019 MRI FINDINGS: Alignment: Facet joints aligned without dislocation. Dens and lateral masses aligned. Skull base and vertebrae: Status post C3-C7 fusion with corpectomy. Inferior-most screws are partially located within the C6-7 disc space, unchanged. Solid arthrodesis at the fused levels. Hardware remains unchanged in alignment without evidence of subsidence. No evidence of acute fracture. Soft tissues and spinal canal: Partially obscured by metallic streak artifact. No definite abnormality. Disc levels: Degenerative disc disease at the C6-7 and C7-T1 levels, as detailed on recent MRI 08/30/2019. Upper chest: Visualized lung apices are clear. 8  mm right thyroid lobe nodule, not clinically significant; no follow-up imaging recommended (ref: J Am Coll Radiol. 2015 Feb;12(2): 143-50). Other: None. IMPRESSION: 1. No evidence of acute fracture or traumatic malalignment of the cervical spine. 2. Status post C3-C7 fusion with corpectomy. 3. Degenerative disc disease at C6-7 and C7-T1, as detailed on recent MRI 08/30/2019. Electronically Signed   By: Davina Poke D.O.   On: 09/03/2019 14:11   CT  Lumbar Spine Wo Contrast  Result Date: 09/03/2019 CLINICAL DATA:  Low back pain.  Fell in the shower yesterday. EXAM: CT LUMBAR SPINE WITHOUT CONTRAST TECHNIQUE: Multidetector CT imaging of the lumbar spine was performed without intravenous contrast administration. Multiplanar CT image reconstructions were also generated. COMPARISON:  Radiography same day.  CT 08/13/2019 FINDINGS: Segmentation: 5 lumbar type vertebral bodies. Alignment: Normal Vertebrae: Old superior endplate depression at L4, unchanged. No other focal or acute bone finding. No recent fracture. No posterior element fracture at L2. Paraspinal and other soft tissues: Aortic atherosclerosis. Disc levels: T11-12: Disc degeneration with endplate osteophytes and mild bulging of the disc. No compressive stenosis. T12-L1 and L1-2: Normal interspaces. L2-3: Bulging of the disc more towards the right. No compressive canal stenosis. Mild right foraminal narrowing. L3-4: Disc bulge. Mild facet and ligamentous hypertrophy. Mild canal stenosis without likely neural compression. L4-5: Advanced bilateral facet arthropathy. No slippage in the supine position. Anterolisthesis would likely occur with standing or flexion. Circumferential protrusion of the disc. Severe stenosis that could cause neural compression on either or both sides. The appearance could worsen with standing or flexion. L5-S1: Endplate osteophytes and central disc protrusion. Mild facet degeneration and hypertrophy. No neural compressive stenosis. Bridging osteophytes at both sacroiliac joints. IMPRESSION: 1. No acute or traumatic finding. Old superior endplate depression at L4, unchanged. No posterior element fracture at L2. 2. L4-5: Advanced bilateral facet arthropathy. No slippage in the supine position. Anterolisthesis would likely occur with standing or flexion. Circumferential protrusion of the disc. Severe stenosis that could cause neural compression on either or both sides. The appearance  could worsen with standing or flexion. 3. L3-4: Mild canal stenosis without likely neural compression. 4. L5-S1: Central disc protrusion. Mild facet degeneration and hypertrophy. No neural compressive stenosis. 5. Bridging osteophytes at both sacroiliac joints. 6. Aortic atherosclerosis. Aortic Atherosclerosis (ICD10-I70.0). Electronically Signed   By: Nelson Chimes M.D.   On: 09/03/2019 16:52   DG Knee Complete 4 Views Right  Result Date: 09/03/2019 CLINICAL DATA:  Fall, fall in shower knee gave out. EXAM: RIGHT KNEE - COMPLETE 4+ VIEW COMPARISON:  None FINDINGS: No sign of acute fracture or dislocation about the knee. No joint effusion. Mild tricompartmental osteoarthritic change. Calcified area in the soft tissues of the posterior thigh at the upper margin of the lateral view with some mild cortical irregularity is of uncertain significance perhaps related to prior trauma and adjacent thickening along the insertional site of musculature in the thigh. IMPRESSION: 1. No acute fracture or dislocation about the knee. 2. Mild tricompartmental osteoarthritic change. 3. Findings at the upper margin of the film that raise the question of prior trauma but are incompletely imaged. Dedicated plain film evaluation of the femur is suggested as an initial means in further assessing these findings. Electronically Signed   By: Zetta Bills M.D.   On: 09/03/2019 14:51    Procedures Procedures (including critical care time)  Medications Ordered in ED Medications  acetaminophen (TYLENOL) tablet 1,000 mg (1,000 mg Oral Given 09/03/19 1511)  oxyCODONE (Oxy IR/ROXICODONE) immediate release  tablet 2.5 mg (2.5 mg Oral Given 09/03/19 1511)    ED Course  I have reviewed the triage vital signs and the nursing notes.  Pertinent labs & imaging results that were available during my care of the patient were reviewed by me and considered in my medical decision making (see chart for details).    MDM Rules/Calculators/A&P                           79 yo M with a chief complaints of a fall.  Nonsyncopal by history.  Complaining of left trapezius pain and low back pain.  Will obtain a CT scan of the head and C-spine plain film of the chest low back and right knee.  Plain film of chest without focal infiltrate, ptx or obvious rib fx.  Plain film of the R knee without fx as viewed by me. CT head and c spine negative for acute finding.   ? fx on xray of the L spine.  Will CT.  Signed out to Dr. Maryan Rued, please see her note for further details of care in the ED.  The patients results and plan were reviewed and discussed.   Any x-rays performed were independently reviewed by myself.   Differential diagnosis were considered with the presenting HPI.  Medications  acetaminophen (TYLENOL) tablet 1,000 mg (1,000 mg Oral Given 09/03/19 1511)  oxyCODONE (Oxy IR/ROXICODONE) immediate release tablet 2.5 mg (2.5 mg Oral Given 09/03/19 1511)    Vitals:   09/03/19 1235 09/03/19 1259 09/03/19 1425 09/03/19 1613  BP:   140/67 (!) 161/82  Pulse:   65 (!) 53  Resp:   16 18  Temp:      TempSrc:      SpO2:  98% 99% 100%  Weight: 92.6 kg     Height: 5\' 5"  (1.651 m)       Final diagnoses:  Fall, initial encounter  Injury of head, initial encounter  Trapezius strain, left, initial encounter  Acute midline low back pain without sciatica      Final Clinical Impression(s) / ED Diagnoses Final diagnoses:  Fall, initial encounter  Injury of head, initial encounter  Trapezius strain, left, initial encounter  Acute midline low back pain without sciatica    Rx / DC Orders ED Discharge Orders         Ordered    diclofenac Sodium (VOLTAREN) 1 % GEL  4 times daily        09/03/19 Hickman, [place], DO 09/04/19 0719

## 2019-09-03 NOTE — ED Provider Notes (Signed)
Assumed care from Dr. Tyrone Nine at 3 PM.  Patient's CT of his lumbar spine shows no acute fracture.  Findings discussed with the patient and his wife and they were discharged home.   Blanchie Dessert, MD 09/03/19 [phone]

## 2019-09-03 NOTE — ED Notes (Signed)
Provided snack, fluids

## 2019-09-03 NOTE — ED Triage Notes (Signed)
Pt fell in shower yesterday morning.  Right knee "gave out". Hit back of head and left shoulder.  Pt is on Xarelto.  Having some blurred vision.  Called pmd and they sent him here. Also having back pain.

## 2019-09-03 NOTE — Discharge Instructions (Signed)
Use the gel as prescribed Also take tylenol 1000mg(2 extra strength) four times a day.    

## 2019-09-08 ENCOUNTER — Other Ambulatory Visit: Payer: Self-pay

## 2019-09-08 NOTE — Patient Outreach (Signed)
[facility]) Care Management  09/08/2019  Jeovanni Heuring [birth year] [id number]   Telephone assessment:  Placed call to patient at [facility] . No answer. Unable to leave a voicemail due to mail box being full.  PLAN: will attempt again tomorrow.  Tomasa Rand, RN, BSN, CEN [facility] [employer] [phone]

## 2019-09-09 ENCOUNTER — Other Ambulatory Visit: Payer: Self-pay | Admitting: *Deleted

## 2019-09-09 ENCOUNTER — Other Ambulatory Visit: Payer: Self-pay

## 2019-09-09 NOTE — Patient Outreach (Signed)
[place] [facility]) Care Management  09/09/2019  Steve Brown 1940-06-29 [id number]  Successful telephone outreach call to patient. HIPAA identifiers obtained. Nurse called patient in response to a VM he left to the nurse to call him back. Patient stated that he was aware [facility] CM was to call him yesterday but he did not receive a call. Patient requested the number for [facility] CM Steve Brown, the phone number for [facility], and the number for [place]. Nurse did provided the patient with all of the above resource numbers. Nurse did inform the patient that Steve Brown did call him yesterday but was unable to leave a voice message due to his voice mailbox being full. Nurse also informed the patient that Steve Brown is scheduled to call him again today. Patient verbalized understanding.   Emelia Loron RN, [place] [phone] [email or web address]

## 2019-09-10 DIAGNOSIS — H547 Unspecified visual loss: Secondary | ICD-10-CM | POA: Diagnosis not present

## 2019-09-10 DIAGNOSIS — Z23 Encounter for immunization: Secondary | ICD-10-CM | POA: Diagnosis not present

## 2019-09-10 DIAGNOSIS — M6281 Muscle weakness (generalized): Secondary | ICD-10-CM | POA: Diagnosis not present

## 2019-09-10 DIAGNOSIS — R2689 Other abnormalities of gait and mobility: Secondary | ICD-10-CM | POA: Diagnosis not present

## 2019-09-12 NOTE — Patient Outreach (Signed)
[place] [facility]) Care Management   09/12/2019  Steve Brown Apr 27, 1940 [id number]  Steve Brown is an 79 y.o. male Late entry documentation: call made to patient on 09/09/2019 Subjective: Patient reports he recently slipped in bathtub and fell.  Reports he went to the emergency department but nothing was broken. Reports being sore all over but especially the left side.  Had an appointment with D. Ross on 09/10/2019  Reports CBG has been elevated due to a steroid injection in his back.  Reports 290-314. Reports he is managing well with friends and family.  Reports he drinks an ensure daily.   Objective:   Vitals with BMI 09/09/2019 09/03/2019 09/03/2019  Height 5' 5" - -  Weight 204 lbs - -  BMI 66.44 - -  Systolic - 034 742  Diastolic - 82 67  Pulse - 53 65   Review of Systems recent fall    Encounter Medications:   Outpatient Encounter Medications as of 09/09/2019  Medication Sig  . acetaminophen (TYLENOL) 500 MG tablet Take 1,000 mg by mouth every 6 (six) hours as needed (pain).   . Ascorbic Acid (VITAMIN C PO) Take 1 tablet by mouth daily.  . cholecalciferol (VITAMIN D) 1000 UNITS tablet Take 1,000 Units by mouth daily.  . diclofenac Sodium (VOLTAREN) 1 % GEL Apply 4 g topically 4 (four) times daily.  . Echinacea 450 MG CAPS Take 450 mg by mouth daily.   . fluticasone (FLONASE) 50 MCG/ACT nasal spray Place 1 spray into both nostrils 2 (two) times daily as needed for allergies.   . furosemide (LASIX) 20 MG tablet Take 1-2 tablets daily as needed for swelling. (Patient taking differently: Take 20 mg by mouth daily. )  . insulin lispro (HUMALOG KWIKPEN) 100 UNIT/ML KiwkPen Inject 6-10 Units into the skin 3 (three) times daily with meals.  Marland Kitchen JANUVIA 50 MG tablet Take 50 mg by mouth daily.  Marland Kitchen losartan (COZAAR) 100 MG tablet Take 100 mg by mouth daily.  . Multiple Vitamin (MULTIVITAMIN WITH MINERALS) TABS tablet Take 1 tablet by mouth daily.  . pregabalin (LYRICA) 300 MG  capsule Take 1 capsule (300 mg total) by mouth 2 (two) times daily.  . rivaroxaban (XARELTO) 20 MG TABS tablet Take 20 mg by mouth daily.   . rosuvastatin (CRESTOR) 10 MG tablet Take 1 tablet (10 mg total) by mouth daily.  Tyler Aas FLEXTOUCH 100 UNIT/ML SOPN FlexTouch Pen Inject 26 Units into the skin every morning.   . valACYclovir (VALTREX) 500 MG tablet Take 500 mg by mouth daily.  Marland Kitchen zinc gluconate 50 MG tablet Take 50 mg by mouth daily.  . nitroGLYCERIN (NITROSTAT) 0.4 MG SL tablet Place 1 tablet (0.4 mg total) under the tongue every 5 (five) minutes as needed for chest pain. (Patient not taking: Reported on 09/09/2019)   No facility-administered encounter medications on file as of 09/09/2019.    Functional Status:   In your present state of health, do you have any difficulty performing the following activities: 09/09/2019 08/12/2019  Hearing? N N  Vision? Y N  Comment wears glasses.  eye appointment next week. -  Difficulty concentrating or making decisions? Y N  Comment reports he  has difficulty staying focused at church. -  Walking or climbing stairs? Y N  Comment uses cane since recent fall. -  Dressing or bathing? N N  Doing errands, shopping? N N  Preparing Food and eating ? N -  Using the Toilet? N -  In the  past six months, have you accidently leaked urine? N -  Do you have problems with loss of bowel control? N -  Managing your Medications? - -  Managing your Finances? N -  Housekeeping or managing your Housekeeping? N -  Some recent data might be hidden    Fall/Depression Screening:    Fall Risk  09/09/2019 08/01/2019 04/28/2019  Falls in the past year? 1 1 0  Number falls in past yr: 0 0 0  Injury with Fall? 1 0 0  Comment muscle pain - -  Risk for fall due to : History of fall(s) Impaired mobility Impaired mobility  Risk for fall due to: Comment - - -  Follow up Falls evaluation completed;Falls prevention discussed Falls prevention discussed;Education provided;Falls  evaluation completed Falls prevention discussed;Education provided;Falls evaluation completed   PHQ 2/9 Scores 09/09/2019 08/01/2019 04/30/2019 04/28/2019 02/10/2019 02/10/2019 03/20/2017  PHQ - 2 Score _0 0  PHQ- 9 Score - _1 - - -    Assessment:   (1) fall risk (2) weakness (3) recent injection of steroids for pain.  Plan:  (1) reviewed safety precautions with patient. He is interested in a life alert. Will place order for social worker to assist with resources for life alert system. (2) reviewed importance of exercise and nutrition.  (3) reviewed importance of following diet. Encouraged patient to discuss with MD at next appointment.  THN CM Care Plan Problem One     Most Recent Value  Care Plan Problem One recent admission for weakness and chest pain  Role Documenting the Problem One Care Management [place] for Problem One Active  THN Long Term Goal  Patient will report decrease in weakness in the next 31 days.   THN Long Term Goal Start Date 08/25/19  Interventions for Problem One Long Term Goal reviewed with patient the importance of good nutrition and trying to stay active.   THN CM Short Term Goal #1  Patient will report attendng MD follow up in 14 days.   THN CM Short Term Goal #1 Start Date 08/25/19  THN CM Short Term Goal #1 Met Date 09/09/19  THN CM Short Term Goal #2  Patient will take all his medications as prescribed in the next 30 days.   THN CM Short Term Goal #2 Start Date 08/25/19  Interventions for Short Term Goal #2 reviewed all meds and encouraged patient to continue to take his medication as prescribed.       Next appointment in 1 week.  Tomasa Rand, RN, BSN, CEN [facility] [employer] [phone]

## 2019-09-18 ENCOUNTER — Other Ambulatory Visit: Payer: Self-pay

## 2019-09-18 NOTE — Patient Outreach (Signed)
[place] [facility]) Care Management  09/18/2019  Steve Brown 1940-11-05 [id number]   Telephone assessment:  Placed call to patient to follow up on progress. Patient answered the phone and identified himself. [facility] and now has sciatic pain. Reports he has an appointment with pain management on 09/19/2019.  Reports he is using his TENS unit right now. Reports CBG today of 177.  Denies any new falls.  Reports his energy level has improved.   Denies any new concerns today.  PLAN: follow up call in 1 week.Tomasa Rand, RN, BSN, CEN [place] Coordinator [phone]

## 2019-09-19 DIAGNOSIS — Z79891 Long term (current) use of opiate analgesic: Secondary | ICD-10-CM | POA: Diagnosis not present

## 2019-09-19 DIAGNOSIS — M542 Cervicalgia: Secondary | ICD-10-CM | POA: Diagnosis not present

## 2019-09-19 DIAGNOSIS — Z79899 Other long term (current) drug therapy: Secondary | ICD-10-CM | POA: Diagnosis not present

## 2019-09-19 DIAGNOSIS — G894 Chronic pain syndrome: Secondary | ICD-10-CM | POA: Diagnosis not present

## 2019-09-19 DIAGNOSIS — M47816 Spondylosis without myelopathy or radiculopathy, lumbar region: Secondary | ICD-10-CM | POA: Diagnosis not present

## 2019-09-19 DIAGNOSIS — H02403 Unspecified ptosis of bilateral eyelids: Secondary | ICD-10-CM | POA: Diagnosis not present

## 2019-09-19 DIAGNOSIS — H5202 Hypermetropia, left eye: Secondary | ICD-10-CM | POA: Diagnosis not present

## 2019-09-19 DIAGNOSIS — H52201 Unspecified astigmatism, right eye: Secondary | ICD-10-CM | POA: Diagnosis not present

## 2019-09-19 DIAGNOSIS — E119 Type 2 diabetes mellitus without complications: Secondary | ICD-10-CM | POA: Diagnosis not present

## 2019-09-19 DIAGNOSIS — G629 Polyneuropathy, unspecified: Secondary | ICD-10-CM | POA: Diagnosis not present

## 2019-09-24 ENCOUNTER — Other Ambulatory Visit: Payer: Self-pay

## 2019-09-24 DIAGNOSIS — M5136 Other intervertebral disc degeneration, lumbar region: Secondary | ICD-10-CM | POA: Diagnosis not present

## 2019-09-24 DIAGNOSIS — M5417 Radiculopathy, lumbosacral region: Secondary | ICD-10-CM | POA: Diagnosis not present

## 2019-09-25 ENCOUNTER — Other Ambulatory Visit: Payer: Self-pay

## 2019-09-26 NOTE — Patient Outreach (Signed)
[place] [facility]) Care Management  09/24/2019  Steve Brown Mar 10, 1940 [id number]   Placed call to patient for telephone assessment. No answer.  PLAN:  Will attempt again.  Tomasa Rand, RN, BSN, CEN [facility] [employer] [phone]

## 2019-09-26 NOTE — Patient Outreach (Signed)
[place] [facility]) Care Management  09/26/2019  Hau Sanor 1940/10/20 [id number]   Telephone assessment:  Placed call to patient who reports that he is feeling some better. Reports he went to pain management clinic and got an injection in back . Reports he needs an injection   Reports CBG higher today because of injection. Reading of 307 this am fasting.  Reports pain 8/10 today in his shoulder.  Patient reports that he is waiting for maintenance to come install a grab bar in the shower.     PLAN: follow up in 1 week.  Tomasa Rand, RN, BSN, CEN [facility] [employer] [phone]

## 2019-09-27 ENCOUNTER — Ambulatory Visit
Admission: RE | Admit: 2019-09-27 | Discharge: 2019-09-27 | Disposition: A | Discharge status: Home / Self Care | Payer: Medicare Other | Source: Ambulatory Visit | Attending: Pain Medicine | Admitting: Pain Medicine

## 2019-09-27 ENCOUNTER — Other Ambulatory Visit: Payer: Self-pay

## 2019-09-27 DIAGNOSIS — M48061 Spinal stenosis, lumbar region without neurogenic claudication: Secondary | ICD-10-CM | POA: Diagnosis not present

## 2019-09-27 DIAGNOSIS — M79605 Pain in left leg: Secondary | ICD-10-CM

## 2019-09-27 DIAGNOSIS — M545 Low back pain, unspecified: Secondary | ICD-10-CM

## 2019-09-29 ENCOUNTER — Other Ambulatory Visit: Payer: Self-pay

## 2019-09-29 NOTE — Patient Outreach (Signed)
[place] [facility]) Care Management  09/29/2019  Steve Brown 08/31/1940 [id number]   1:38pm  Telephone assessment:  Placed call to patient who reports that he is doing ok.  Sounds tired on the phone. Reports 2 nights ago he did not sleep well and states he was dreaming that he had covid.  Reports back pain is decreased since injection.  Reports CBG elevated to 310-340.  Reports taking all medications as prescribed. Reports following Dm diet.  Patient reports he feels tired today.   PLAN: encouraged patient to follow diet, drink lot of water and call MD for CBG greater than 400.  Encouraged patient to follow up with MD as planned.  Next Outreach 10 days.  Tomasa Rand, RN, BSN, CEN [facility] [employer] [phone]

## 2019-10-01 DIAGNOSIS — Z8249 Family history of ischemic heart disease and other diseases of the circulatory system: Secondary | ICD-10-CM | POA: Diagnosis not present

## 2019-10-01 DIAGNOSIS — I2510 Atherosclerotic heart disease of native coronary artery without angina pectoris: Secondary | ICD-10-CM | POA: Diagnosis not present

## 2019-10-01 DIAGNOSIS — E7849 Other hyperlipidemia: Secondary | ICD-10-CM | POA: Diagnosis not present

## 2019-10-01 DIAGNOSIS — I10 Essential (primary) hypertension: Secondary | ICD-10-CM | POA: Diagnosis not present

## 2019-10-01 DIAGNOSIS — Z1379 Encounter for other screening for genetic and chromosomal anomalies: Secondary | ICD-10-CM | POA: Diagnosis not present

## 2019-10-01 DIAGNOSIS — I259 Chronic ischemic heart disease, unspecified: Secondary | ICD-10-CM | POA: Diagnosis not present

## 2019-10-03 ENCOUNTER — Ambulatory Visit: Payer: Medicare Other | Admitting: *Deleted

## 2019-10-09 ENCOUNTER — Other Ambulatory Visit: Payer: Self-pay

## 2019-10-09 DIAGNOSIS — E1165 Type 2 diabetes mellitus with hyperglycemia: Secondary | ICD-10-CM | POA: Diagnosis not present

## 2019-10-09 DIAGNOSIS — E669 Obesity, unspecified: Secondary | ICD-10-CM | POA: Diagnosis not present

## 2019-10-09 DIAGNOSIS — E7800 Pure hypercholesterolemia, unspecified: Secondary | ICD-10-CM | POA: Diagnosis not present

## 2019-10-09 DIAGNOSIS — I10 Essential (primary) hypertension: Secondary | ICD-10-CM | POA: Diagnosis not present

## 2019-10-09 NOTE — Patient Outreach (Signed)
[place] [facility]) Care Management  10/09/2019  Lamontae Ricardo May 06, 1940 [id number]   Telephone assessment:   Placed call to patient who answered and identified himself. Reports he is moving slow. Reports pain comes and goes. Reports CBG elevated since last injection to back. Range 69-310.  Reports he continues to exercise daily.  Patient reports that he called his primary MD about "moving slowly"  Reports he has an MD appointment with Dr. Harrington Challenger on 10/13/2019. Also reports he will see his endocrinologist today.  Reports he is pending a knee injection on 10/13/2019 as well. Reports he has dizziness. I reviewed with patient that he needs to ensure that he is staying hydrated.  Reviewed signs and symptoms of high CBG as well. Encouraged patient to continue to take his medications as prescribed and call MD for changes in condition. He agreed.  PLAN: follow up call in 1 week then plan to close case.  Tomasa Rand, RN, BSN, CEN [facility] [employer] [phone]

## 2019-10-13 DIAGNOSIS — R5383 Other fatigue: Secondary | ICD-10-CM | POA: Diagnosis not present

## 2019-10-13 DIAGNOSIS — R464 Slowness and poor responsiveness: Secondary | ICD-10-CM | POA: Diagnosis not present

## 2019-10-13 DIAGNOSIS — N183 Chronic kidney disease, stage 3 unspecified: Secondary | ICD-10-CM | POA: Diagnosis not present

## 2019-10-13 DIAGNOSIS — M1711 Unilateral primary osteoarthritis, right knee: Secondary | ICD-10-CM | POA: Diagnosis not present

## 2019-10-15 ENCOUNTER — Other Ambulatory Visit: Payer: Self-pay

## 2019-10-15 NOTE — Patient Outreach (Signed)
[place] [facility]) Care Management  10/15/2019  Jani Ploeger Jul 04, 1940 [id number]   Telephone assessment:  Placed call to patient for telephone follow up. No answer and voice mailbox was full.   PLAN: will attempt another call in 3 business days.   Tomasa Rand, RN, BSN, CEN [facility] [employer] [phone]

## 2019-10-20 ENCOUNTER — Other Ambulatory Visit: Payer: Self-pay

## 2019-10-20 NOTE — Patient Outreach (Signed)
[place] [facility]) Care Management  10/20/2019  Steve Brown 07-01-1940 [id number]   Case closure telephone call:  Placed call to patient who answered and identified himself. Reports that he is doing well. [facility] and is planning to go to chair yoga tomorrow. Reports CBG are back to normal now that the steroid injections are over. Reports decreased pain to 3/10 today.  Denis any recent falls. Reviewed goals with patient and he feels like he has met his goals and is doing well. Reviewed case closure and patient is in agreement.  Reviewed with patient to call me in the future if needed.   PLAN: close case as goals are met. Will send patient and MD case closure letters.  Tomasa Rand, RN, BSN, CEN [facility] [employer] [phone]

## 2019-10-21 DIAGNOSIS — Z8546 Personal history of malignant neoplasm of prostate: Secondary | ICD-10-CM | POA: Diagnosis not present

## 2019-10-28 DIAGNOSIS — R351 Nocturia: Secondary | ICD-10-CM | POA: Diagnosis not present

## 2019-10-28 DIAGNOSIS — Z8546 Personal history of malignant neoplasm of prostate: Secondary | ICD-10-CM | POA: Diagnosis not present

## 2019-10-28 DIAGNOSIS — R350 Frequency of micturition: Secondary | ICD-10-CM | POA: Diagnosis not present

## 2019-11-06 DIAGNOSIS — E1151 Type 2 diabetes mellitus with diabetic peripheral angiopathy without gangrene: Secondary | ICD-10-CM | POA: Diagnosis not present

## 2019-11-06 DIAGNOSIS — I739 Peripheral vascular disease, unspecified: Secondary | ICD-10-CM | POA: Diagnosis not present

## 2019-11-06 DIAGNOSIS — L603 Nail dystrophy: Secondary | ICD-10-CM | POA: Diagnosis not present

## 2019-11-10 ENCOUNTER — Telehealth: Payer: Self-pay | Admitting: Interventional Cardiology

## 2019-11-10 NOTE — Telephone Encounter (Signed)
Patient needs an early appt. Please call

## 2019-11-10 NOTE — Telephone Encounter (Signed)
Called and spoke to patient. He states that he has a lab appt on 11/15 that he was going to have to reschedule, but he was able to work it out.

## 2019-11-24 ENCOUNTER — Other Ambulatory Visit: Payer: Self-pay

## 2019-11-24 ENCOUNTER — Other Ambulatory Visit: Payer: Medicare Other

## 2019-11-24 ENCOUNTER — Ambulatory Visit: Payer: Medicare Other | Admitting: Interventional Cardiology

## 2019-11-24 DIAGNOSIS — I25118 Atherosclerotic heart disease of native coronary artery with other forms of angina pectoris: Secondary | ICD-10-CM | POA: Diagnosis not present

## 2019-11-24 LAB — HEPATIC FUNCTION PANEL
ALT: 21 IU/L (ref 0–44)
AST: 19 IU/L (ref 0–40)
Albumin: 4 g/dL (ref 3.7–4.7)
Alkaline Phosphatase: 102 IU/L (ref 44–121)
Bilirubin Total: 0.6 mg/dL (ref 0.0–1.2)
Bilirubin, Direct: 0.17 mg/dL (ref 0.00–0.40)
Total Protein: 6.3 g/dL (ref 6.0–8.5)

## 2019-11-24 LAB — LIPID PANEL
Chol/HDL Ratio: 2.6 ratio (ref 0.0–5.0)
Cholesterol, Total: 139 mg/dL (ref 100–199)
HDL: 53 mg/dL (ref >39)
LDL Chol Calc (NIH): 72 mg/dL (ref 0–99)
Triglycerides: 70 mg/dL (ref 0–149)
VLDL Cholesterol Cal: 14 mg/dL (ref 5–40)

## 2019-11-28 DIAGNOSIS — E782 Mixed hyperlipidemia: Secondary | ICD-10-CM | POA: Diagnosis not present

## 2019-11-28 DIAGNOSIS — I208 Other forms of angina pectoris: Secondary | ICD-10-CM | POA: Diagnosis not present

## 2019-11-28 DIAGNOSIS — C61 Malignant neoplasm of prostate: Secondary | ICD-10-CM | POA: Diagnosis not present

## 2019-11-28 DIAGNOSIS — N400 Enlarged prostate without lower urinary tract symptoms: Secondary | ICD-10-CM | POA: Diagnosis not present

## 2019-11-28 DIAGNOSIS — F329 Major depressive disorder, single episode, unspecified: Secondary | ICD-10-CM | POA: Diagnosis not present

## 2019-11-28 DIAGNOSIS — K219 Gastro-esophageal reflux disease without esophagitis: Secondary | ICD-10-CM | POA: Diagnosis not present

## 2019-11-28 DIAGNOSIS — D649 Anemia, unspecified: Secondary | ICD-10-CM | POA: Diagnosis not present

## 2019-11-28 DIAGNOSIS — N183 Chronic kidney disease, stage 3 unspecified: Secondary | ICD-10-CM | POA: Diagnosis not present

## 2019-11-28 DIAGNOSIS — I209 Angina pectoris, unspecified: Secondary | ICD-10-CM | POA: Diagnosis not present

## 2019-11-28 DIAGNOSIS — E1140 Type 2 diabetes mellitus with diabetic neuropathy, unspecified: Secondary | ICD-10-CM | POA: Diagnosis not present

## 2019-11-28 DIAGNOSIS — I2510 Atherosclerotic heart disease of native coronary artery without angina pectoris: Secondary | ICD-10-CM | POA: Diagnosis not present

## 2019-11-28 DIAGNOSIS — I10 Essential (primary) hypertension: Secondary | ICD-10-CM | POA: Diagnosis not present

## 2019-11-30 NOTE — Progress Notes (Signed)
PATIENT: Steve Brown DOB: [birth year]  REASON FOR VISIT: follow up HISTORY FROM: patient  HISTORY OF PRESENT ILLNESS: Today 11/30/19:  Steve Brown is a 79 year old male with a history of obstructive sleep apnea on CPAP.  He returns today for follow-up.  Download indicates that he uses machine 28 out of 30 days for compliance of 93%.  He uses machine greater than 4 hours 26 days for compliance of 87%.  On average he uses his machine 6 hours and 15 minutes.  Residual AHI is 5.5 on 13 cm of water with EPR 1.  Leak in the 95th percentile is 64.5 liters per minute. He reports that he does feel the mask leaking at night. He currently has a full facemask.  Patient reports that he continues to struggle with fatigue. Reports that he does like to go to the gym but finds that he cannot do much due to Brown of energy. States that he has a hard time driving longer distances due to fatigue. He has had a full work-up with his primary care and cardiologist and reports that they cannot find any causes of his fatigue. He returns today for an evaluation.  11/28/18:  Steve Brown is a 79 year old male with a history of obstructive sleep apnea on CPAP.  He returns today for follow-up.  His download indicates that he uses machine nightly for compliance of 100%.  He uses machine greater than 4 hours 29 days for compliance of 97%.  On average he uses his machine 5 hours and 50 minutes.  His residual AHI is 1.1 on 13 cm of water with EPR of 1.  His leak in the 95th percentile is 39.2 L/min.  He reports that the CPAP is working well.  He has noticed the benefit.  On occasion he may feel the mask leaking but this is not the norm.  He returns today for an evaluation. HISTORY   REVIEW OF SYSTEMS: Out of a complete 14 system review of symptoms, the patient complains only of the following symptoms, and all other reviewed systems are negative.  FSS 46  ESS 6  ALLERGIES: Allergies  Allergen Reactions  . Other Other  (See Comments)    Per patient- cardiac cath dye-  "woke up during procedure hysterical."  . Phenergan [Promethazine] Other (See Comments)    Mood changes   . Tramadol     HOME MEDICATIONS: Outpatient Medications Prior to Visit  Medication Sig Dispense Refill  . acetaminophen (TYLENOL) 500 MG tablet Take 1,000 mg by mouth every 6 (six) hours as needed (pain).     . Ascorbic Acid (VITAMIN C PO) Take 1 tablet by mouth daily.    . cholecalciferol (VITAMIN D) 1000 UNITS tablet Take 1,000 Units by mouth daily.    . diclofenac Sodium (VOLTAREN) 1 % GEL Apply 4 g topically 4 (four) times daily. 100 g 0  . Echinacea 450 MG CAPS Take 450 mg by mouth daily.     . fluticasone (FLONASE) 50 MCG/ACT nasal spray Place 1 spray into both nostrils 2 (two) times daily as needed for allergies.   0  . furosemide (LASIX) 20 MG tablet Take 1-2 tablets daily as needed for swelling. (Patient taking differently: Take 20 mg by mouth daily. ) 90 tablet 3  . insulin lispro (HUMALOG KWIKPEN) 100 UNIT/ML KiwkPen Inject 6-10 Units into the skin 3 (three) times daily with meals.    Marland Kitchen JANUVIA 50 MG tablet Take 50 mg by mouth daily.    Marland Kitchen  losartan (COZAAR) 100 MG tablet Take 100 mg by mouth daily.    . Multiple Vitamin (MULTIVITAMIN WITH MINERALS) TABS tablet Take 1 tablet by mouth daily.    . nitroGLYCERIN (NITROSTAT) 0.4 MG SL tablet Place 1 tablet (0.4 mg total) under the tongue every 5 (five) minutes as needed for chest pain. (Patient not taking: Reported on 09/09/2019) 25 tablet 12  . pregabalin (LYRICA) 300 MG capsule Take 1 capsule (300 mg total) by mouth 2 (two) times daily. 180 capsule 3  . rivaroxaban (XARELTO) 20 MG TABS tablet Take 20 mg by mouth daily.     . rosuvastatin (CRESTOR) 10 MG tablet Take 1 tablet (10 mg total) by mouth daily. 90 tablet 3  . TRESIBA FLEXTOUCH 100 UNIT/ML SOPN FlexTouch Pen Inject 26 Units into the skin every morning.     . valACYclovir (VALTREX) 500 MG tablet Take 500 mg by mouth daily.     Marland Kitchen zinc gluconate 50 MG tablet Take 50 mg by mouth daily.     No facility-administered medications prior to visit.    PAST MEDICAL HISTORY: Past Medical History:  Diagnosis Date  . Abnormal prostate biopsy   . Anticoagulant long-term use    currently xarelto  . BPH with elevated PSA   . CKD (chronic kidney disease), stage II   . Complication of anesthesia    limted neck rom limited use of left arm due to cva  . Coronary artery disease    CARDIOLOGIST-  Steve Brown--  2010-- PCI w/ stenting midLAD  . DDD (degenerative disc disease), lumbar   . Degeneration of cervical intervertebral disc   . Depression   . Dyspnea on exertion   . GERD (gastroesophageal reflux disease)   . Hemiparesis due to cerebral infarction   . History of cerebrovascular accident (CVA) with residual deficit 2002 and 2003--  hemiparisis both sides   per MRI  anterior left frontal lobe, left para midline pons, and inferior cerebullam bilaterally infarcts  . History of pulmonary embolus (PE)    06-30-2012  extensive bilaterally  . History of recurrent TIAs   . History of syncope    hx multiple pre-syncope and syncopal episodes due to vasovagal, orthostatic hypotension, dehydration  . History of TIAs    several since 2002  . Hyperlipidemia   . Hypertension   . Mild atherosclerosis of carotid artery, bilateral    per last duplex 11-04-2014  bilateral ICA 1--39%  . Neuropathy    fingers  . OSA on CPAP    followed by Steve Brown--  sev. osa w/ AHI 65.9  . Prostate cancer ([place]) dx 2018  . Renal insufficiency   . S/P coronary artery stent placement 2010   stenting to mid LAD  . Simple renal cyst    bilaterally  . Stroke ([place])   . Trigger finger of both hands 11-17-13  . Type 2 diabetes mellitus ([place]) dx 1986   last one A1c 9.2 on 04-26-2016  . Unsteady gait    . Hx prior CVA/TIAs;  . Vertebral artery occlusion, left    chronic    PAST SURGICAL HISTORY: Past Surgical History:  Procedure Laterality  Date  . ANTERIOR CERVICAL DECOMP/DISCECTOMY FUSION  2004   C3 -- C6 limited rom  . CARDIAC CATHETERIZATION  06-10-2010   Steve Brown   wide patent LAD stent, mid lesion at the origin of the septal prior to the previous stent 40-50%/  normal LVF, ef 55%  . CARDIOVASCULAR STRESS TEST  10-23-2012  Steve Brown   normal nuclear perfusion study w/ no ischemia/  normal LV function and wall motion , ef 65%  . CARPAL TUNNEL RELEASE Bilateral   . CATARACT EXTRACTION W/ INTRAOCULAR LENS  IMPLANT, BILATERAL    . CHOLECYSTECTOMY N/A 11/02/2015   Procedure: LAPAROSCOPIC CHOLECYSTECTOMY WITH INTRAOPERATIVE CHOLANGIOGRAM;  Surgeon: Donnie Mesa, MD;  Location: [facility];  Service: General;  Laterality: N/A;  . COLONOSCOPY    . CORONARY ANGIOPLASTY WITH STENT PLACEMENT  02/2008   stenting to mid LAD  . GOLD SEED IMPLANT N/A 11/15/2016   Procedure: GOLD SEED IMPLANT TIMES THREE;  Surgeon: Ardis Hughs, MD;  Location: [facility];  Service: Urology;  Laterality: N/A;  . IR ANGIO INTRA EXTRACRAN SEL COM CAROTID INNOMINATE BILAT MOD SED  06/13/2018  . IR ANGIO VERTEBRAL SEL VERTEBRAL UNI R MOD SED  06/13/2018  . IR US GUIDE VASC ACCESS RIGHT  06/13/2018  . LEFT HEART CATH AND CORONARY ANGIOGRAPHY N/A 05/25/2017   Procedure: LEFT HEART CATH AND CORONARY ANGIOGRAPHY;  Surgeon: Jettie Booze, MD;  Location: [facility];  Service: Cardiovascular;  Laterality: N/A;  . LEFT HEART CATHETERIZATION WITH CORONARY ANGIOGRAM N/A 04/03/2013   Procedure: LEFT HEART CATHETERIZATION WITH CORONARY ANGIOGRAM;  Surgeon: Jettie Booze, MD;  Location: [facility];  Service: Cardiovascular;  Laterality: N/A;  patent mLAD stent  w/ mild disease in remainder LAD and its branches;  mod. focal lesion midLCFx- FFR of lesion was negative for ischemia/  normal LVSF, ef 50%  . lungs  2005   "fluid pumped off lungs"  . NEUROPLASTY / TRANSPOSITION ULNAR NERVE AT ELBOW Right 2004  . PROSTATE BIOPSY N/A 08/31/2016     Procedure: PROSTATE  BIOPSY TRANSRECTAL ULTRASONIC PROSTATE (TUBP);  Surgeon: Ardis Hughs, MD;  Location: [facility];  Service: Urology;  Laterality: N/A;  . SPACE OAR INSTILLATION N/A 11/15/2016   Procedure: SPACE OAR INSTILLATION;  Surgeon: Ardis Hughs, MD;  Location: [facility];  Service: Urology;  Laterality: N/A;  . TRANSTHORACIC ECHOCARDIOGRAM  04/27/2016   severe focal basal LVH, ef 60-65%,  grade 2 diastoilc dysfunction/  mild AR, MR, and TR/  atrial septum lipomatous hypertrophy/  PASP 18mmHg  . UMBILICAL HERNIA REPAIR      FAMILY HISTORY: Family History  Problem Relation Age of Onset  . Aneurysm Mother   . Cancer Father        unknown either pancreatic or prostate  . Stroke Brother   . Dementia Sister   . Heart attack Neg Hx     SOCIAL HISTORY: Social History   Socioeconomic History  . Marital status: Divorced    Spouse name: Not on file  . Number of children: 4  . Years of education: 82  . Highest education level: Not on file  Occupational History    Employer: RETIRED    Comment: retired  Tobacco Use  . Smoking status: Never Smoker  . Smokeless tobacco: Never Used  Vaping Use  . Vaping Use: Never used  Substance and Sexual Activity  . Alcohol use: No    Alcohol/week: 0.0 standard drinks  . Drug use: No  . Sexual activity: Not Currently  Other Topics Concern  . Not on file  Social History Narrative   Patient lives at home alone and he is single.  Patient is retired.    Caffeine - one cups daily.   Right handed.   Patient has a high school education.  Patient has four adult children.   Social Determinants of Health   Financial Resource Strain: Low Risk   . Difficulty of Paying Living Expenses: Not hard at all  Food Insecurity: No Food Insecurity  . Worried About Charity fundraiser in the Last Year: Never true  . Ran Out of Food in the Last Year: Never true  Transportation Needs: No Transportation  Needs  . Brown of Transportation (Medical): No  . Brown of Transportation (Non-Medical): No  Physical Activity: Inactive  . Days of Exercise per Week: 0 days  . Minutes of Exercise per Session: 0 min  Stress:   . Feeling of Stress : Not on file  Social Connections:   . Frequency of Communication with Friends and Family: Not on file  . Frequency of Social Gatherings with Friends and Family: Not on file  . Attends Religious Services: Not on file  . Active Member of Clubs or Organizations: Not on file  . Attends Archivist Meetings: Not on file  . Marital Status: Not on file  Intimate Partner Violence:   . Fear of Current or Ex-Partner: Not on file  . Emotionally Abused: Not on file  . Physically Abused: Not on file  . Sexually Abused: Not on file      PHYSICAL EXAM  Vitals:   12/01/19 1016  BP: 126/72  Weight: 208 lb (94.3 kg)  Height: 5\' 5"  (1.651 m)   Body mass index is 34.61 kg/m.  Generalized: Well developed, in no acute distress  Chest: Lungs clear to auscultation bilaterally  Neurological examination  Mentation: Alert oriented to time, place, history taking. Follows all commands speech and language fluent Cranial nerve II-XII: Extraocular movements were full, visual field were full on confrontational test Head turning and shoulder shrug  were normal and symmetric. Motor: The motor testing reveals 5 over 5 strength of all 4 extremities. Good symmetric motor tone is noted throughout.  Sensory: Sensory testing is intact to soft touch on all 4 extremities. No evidence of extinction is noted.  Gait and station: Gait is normal.    DIAGNOSTIC DATA (LABS, IMAGING, TESTING) - I reviewed patient records, labs, notes, testing and imaging myself where available.  Lab Results  Component Value Date   WBC 5.0 08/13/2019   HGB 12.9 (L) 08/13/2019   HCT 38.0 (L) 08/13/2019   MCV 93.1 08/13/2019   PLT 222 08/13/2019      Component Value Date/Time   NA 135  08/22/2019 1207   NA 140 08/14/2012 1513   K 4.4 08/22/2019 1207   K 4.3 08/14/2012 1513   CL 99 08/22/2019 1207   CO2 25 08/22/2019 1207   CO2 27 08/14/2012 1513   GLUCOSE 153 (H) 08/22/2019 1207   GLUCOSE 327 (H) 08/16/2019 0422   GLUCOSE 262 (H) 08/14/2012 1513   BUN 29 (H) 08/22/2019 1207   BUN 19.0 08/14/2012 1513   CREATININE 1.36 (H) 08/22/2019 1207   CREATININE 1.29 03/19/2014 1130   CREATININE 1.3 08/14/2012 1513   CALCIUM 8.7 08/22/2019 1207   CALCIUM 9.3 08/14/2012 1513   PROT 6.3 11/24/2019 0944   PROT 6.9 08/14/2012 1513   ALBUMIN 4.0 11/24/2019 0944   ALBUMIN 3.4 (L) 08/14/2012 1513   AST 19 11/24/2019 0944   AST 15 08/14/2012 1513   ALT 21 11/24/2019 0944   ALT 14 08/14/2012 1513   ALKPHOS 102 11/24/2019 0944   ALKPHOS 92 08/14/2012 1513   BILITOT 0.6 11/24/2019 0944   BILITOT 0.39  08/14/2012 1513   GFRNONAA 50 (L) 08/22/2019 1207   GFRNONAA 67 05/19/2013 1108   GFRAA 58 (L) 08/22/2019 1207   GFRAA 78 05/19/2013 1108   Lab Results  Component Value Date   CHOL 139 11/24/2019   HDL 53 11/24/2019   LDLCALC 72 11/24/2019   TRIG 70 11/24/2019   CHOLHDL 2.6 11/24/2019   Lab Results  Component Value Date   HGBA1C 7.5 (H) 08/12/2019   No results found for: VITAMINB12 Lab Results  Component Value Date   TSH 1.536 08/12/2019      ASSESSMENT AND PLAN 79 y.o. year old male  has a past medical history of Abnormal prostate biopsy, Anticoagulant long-term use, BPH with elevated PSA, CKD (chronic kidney disease), stage II, Complication of anesthesia, Coronary artery disease, DDD (degenerative disc disease), lumbar, Degeneration of cervical intervertebral disc, Depression, Dyspnea on exertion, GERD (gastroesophageal reflux disease), Hemiparesis due to cerebral infarction, History of cerebrovascular accident (CVA) with residual deficit (2002 and 2003--  hemiparisis both sides), History of pulmonary embolus (PE), History of recurrent TIAs, History of syncope,  History of TIAs, Hyperlipidemia, Hypertension, Mild atherosclerosis of carotid artery, bilateral, Neuropathy, OSA on CPAP, Prostate cancer ([place]) (dx 2018), Renal insufficiency, S/P coronary artery stent placement (2010), Simple renal cyst, Stroke ([place]), Trigger finger of both hands (11-17-13), Type 2 diabetes mellitus ([place]) (dx 1986), Unsteady gait, and Vertebral artery occlusion, left. here with:  1. OSA on CPAP  - CPAP compliance excellent - Good treatment of AHI  -Mask refitting due to high leak - Encourage patient to use CPAP nightly and > 4 hours each night   2. Daytime sleepiness and fatigue  -Patient has had a full work-up for fatigue that has been relatively unremarkable per the patient -We will try Provigil 100 mg daily to see if he finds this beneficial. -I have reviewed potential side effects with the patient and provided him with a handout reviewing this medication  Follow-up in 6 months or sooner if needed  I spent 30  minutes of face-to-face and non-face-to-face time with patient.  This included previsit chart review, lab review, study review, order entry, electronic health record documentation, patient education.  Ward Givens, MSN, NP-C 11/30/2019, 8:48 AM [facility] [address] [place], [place] [address] [phone]

## 2019-11-30 NOTE — Patient Instructions (Addendum)
Continue using CPAP nightly and greater than 4 hours each night Mask refitting Start Modafinil 100 mg daily for sleepiness and fatigue If your symptoms worsen or you develop new symptoms please let us know.   Modafinil tablets What is this medicine? MODAFINIL (moe DAF i nil) is used to treat excessive sleepiness caused by certain sleep disorders. This includes narcolepsy, sleep apnea, and shift work sleep disorder. This medicine may be used for other purposes; ask your health care provider or pharmacist if you have questions. COMMON BRAND NAME(S): Provigil What should I tell my health care provider before I take this medicine? They need to know if you have any of these conditions:  history of depression, mania, or other mental disorder  kidney disease  liver disease  an unusual or allergic reaction to modafinil, other medicines, foods, dyes, or preservatives  pregnant or trying to get pregnant  breast-feeding How should I use this medicine? Take this medicine by mouth with a glass of water. Follow the directions on the prescription label. Take your doses at regular intervals. Do not take your medicine more often than directed. Do not stop taking except on your doctor's advice. A special MedGuide will be given to you by the pharmacist with each prescription and refill. Be sure to read this information carefully each time. Talk to your pediatrician regarding the use of this medicine in children. This medicine is not approved for use in children. Overdosage: If you think you have taken too much of this medicine contact a poison control center or emergency room at once. NOTE: This medicine is only for you. Do not share this medicine with others. What if I miss a dose? If you miss a dose, take it as soon as you can. If it is almost time for your next dose, take only that dose. Do not take double or extra doses. What may interact with this medicine? Do not take this medicine with any of  the following medications:  amphetamine or dextroamphetamine  dexmethylphenidate or methylphenidate  medicines called MAO Inhibitors like Nardil, Parnate, Marplan, Eldepryl  pemoline  procarbazine This medicine may also interact with the following medications:  antifungal medicines like itraconazole or ketoconazole  barbiturates like phenobarbital  birth control pills or other hormone-containing birth control devices or implants  carbamazepine  cyclosporine  diazepam  medicines for depression, anxiety, or psychotic disturbances  phenytoin  propranolol  triazolam  warfarin This list may not describe all possible interactions. Give your health care provider a list of all the medicines, herbs, non-prescription drugs, or dietary supplements you use. Also tell them if you smoke, drink alcohol, or use illegal drugs. Some items may interact with your medicine. What should I watch for while using this medicine? Visit your doctor or health care provider for regular checks on your progress. The full effects of this medicine may not be seen right away. This medicine may cause serious skin reactions. They can happen weeks to months after starting the medicine. Contact your health care provider right away if you notice fevers or flu-like symptoms with a rash. The rash may be red or purple and then turn into blisters or peeling of the skin. Or, you might notice a red rash with swelling of the face, lips or lymph nodes in your neck or under your arms. This medicine may affect your concentration, function, or may hide signs that you are tired. You may get dizzy. Do not drive, use machinery, or do anything that needs mental alertness  until you know how this drug affects you. Alcohol can make you more dizzy and may interfere with your response to this medicine or your alertness. Avoid alcoholic drinks. Birth control pills may not work properly while you are taking this medicine. Talk to your  doctor about using an extra method of birth control. It is unknown if the effects of this medicine will be increased by the use of caffeine. Caffeine is available in many foods, beverages, and medications. Ask your doctor if you should limit or change your intake of caffeine-containing products while on this medicine. What side effects may I notice from receiving this medicine? Side effects that you should report to your doctor or health care professional as soon as possible:  allergic reactions like skin rash, itching or hives, swelling of the face, lips, or tongue  anxiety  breathing problems  chest pain  fast, irregular heartbeat  hallucinations  increased blood pressure  rash, fever, and swollen lymph nodes  redness, blistering, peeling or loosening of the skin, including inside the mouth  sore throat, fever, or chills  suicidal thoughts or other mood changes  tremors  vomiting Side effects that usually do not require medical attention (report to your doctor or health care professional if they continue or are bothersome):  headache  nausea, diarrhea, or stomach upset  nervousness  trouble sleeping This list may not describe all possible side effects. Call your doctor for medical advice about side effects. You may report side effects to FDA at 1-800-FDA-1088. Where should I keep my medicine? Keep out of the reach of children. This medicine can be abused. Keep your medicine in a safe place to protect it from theft. Do not share this medicine with anyone. Selling or giving away this medicine is dangerous and against the law. This medicine may cause accidental overdose and death if taken by other adults, children, or pets. Mix any unused medicine with a substance like cat litter or coffee grounds. Then throw the medicine away in a sealed container like a sealed bag or a coffee can with a lid. Do not use the medicine after the expiration date. Store at room temperature  between 20 and 25 degrees C (68 and 77 degrees F). NOTE: This sheet is a summary. It may not cover all possible information. If you have questions about this medicine, talk to your doctor, pharmacist, or health care provider.  2020 Elsevier/Gold Standard (2018-04-03 10:08:08)

## 2019-12-01 ENCOUNTER — Telehealth: Payer: Self-pay | Admitting: Adult Health

## 2019-12-01 ENCOUNTER — Ambulatory Visit (INDEPENDENT_AMBULATORY_CARE_PROVIDER_SITE_OTHER): Payer: Medicare Other | Admitting: Adult Health

## 2019-12-01 ENCOUNTER — Other Ambulatory Visit: Payer: Self-pay

## 2019-12-01 ENCOUNTER — Encounter: Payer: Self-pay | Admitting: Adult Health

## 2019-12-01 VITALS — BP 126/72 | Ht 65.0 in | Wt 208.0 lb

## 2019-12-01 DIAGNOSIS — I25118 Atherosclerotic heart disease of native coronary artery with other forms of angina pectoris: Secondary | ICD-10-CM | POA: Diagnosis not present

## 2019-12-01 DIAGNOSIS — Z9989 Dependence on other enabling machines and devices: Secondary | ICD-10-CM

## 2019-12-01 DIAGNOSIS — G4733 Obstructive sleep apnea (adult) (pediatric): Secondary | ICD-10-CM | POA: Diagnosis not present

## 2019-12-01 MED ORDER — MODAFINIL 100 MG PO TABS
100.0000 mg | ORAL_TABLET | Freq: Every day | ORAL | 5 refills | Status: DC
Start: 2019-12-01 — End: 2020-06-01

## 2019-12-01 NOTE — Telephone Encounter (Signed)
Received a PA request for modafinil 100mg  from patient's pharmacy. PA was started on [email or web address]. Key is [id number]. PA was instantly approved. Approved 12/01/19 to 11/30/20. Will fax a copy of the determination to patient's pharmacy.

## 2019-12-01 NOTE — Progress Notes (Signed)
Order for cpap supplies sent to [facility] via community msg. Confirmation received that the order transmitted was successful.

## 2019-12-03 DIAGNOSIS — M545 Low back pain, unspecified: Secondary | ICD-10-CM | POA: Diagnosis not present

## 2019-12-09 ENCOUNTER — Other Ambulatory Visit: Payer: Self-pay | Admitting: Family Medicine

## 2019-12-09 DIAGNOSIS — R109 Unspecified abdominal pain: Secondary | ICD-10-CM

## 2019-12-10 DIAGNOSIS — M5417 Radiculopathy, lumbosacral region: Secondary | ICD-10-CM | POA: Diagnosis not present

## 2019-12-10 DIAGNOSIS — M5136 Other intervertebral disc degeneration, lumbar region: Secondary | ICD-10-CM | POA: Diagnosis not present

## 2019-12-11 DIAGNOSIS — I10 Essential (primary) hypertension: Secondary | ICD-10-CM | POA: Diagnosis not present

## 2019-12-11 DIAGNOSIS — E1165 Type 2 diabetes mellitus with hyperglycemia: Secondary | ICD-10-CM | POA: Diagnosis not present

## 2019-12-11 DIAGNOSIS — E7800 Pure hypercholesterolemia, unspecified: Secondary | ICD-10-CM | POA: Diagnosis not present

## 2019-12-26 ENCOUNTER — Ambulatory Visit
Admission: RE | Admit: 2019-12-26 | Discharge: 2019-12-26 | Disposition: A | Discharge status: Home / Self Care | Payer: Medicare Other | Source: Ambulatory Visit | Attending: Family Medicine | Admitting: Family Medicine

## 2019-12-26 DIAGNOSIS — M545 Low back pain, unspecified: Secondary | ICD-10-CM | POA: Diagnosis not present

## 2019-12-26 DIAGNOSIS — R109 Unspecified abdominal pain: Secondary | ICD-10-CM

## 2019-12-26 DIAGNOSIS — C61 Malignant neoplasm of prostate: Secondary | ICD-10-CM | POA: Diagnosis not present

## 2019-12-30 DIAGNOSIS — E119 Type 2 diabetes mellitus without complications: Secondary | ICD-10-CM | POA: Diagnosis not present

## 2019-12-30 DIAGNOSIS — D123 Benign neoplasm of transverse colon: Secondary | ICD-10-CM | POA: Diagnosis not present

## 2019-12-30 DIAGNOSIS — K635 Polyp of colon: Secondary | ICD-10-CM | POA: Diagnosis not present

## 2019-12-30 DIAGNOSIS — Z86010 Personal history of colonic polyps: Secondary | ICD-10-CM | POA: Diagnosis not present

## 2019-12-30 DIAGNOSIS — Z1211 Encounter for screening for malignant neoplasm of colon: Secondary | ICD-10-CM | POA: Diagnosis not present

## 2020-01-06 DIAGNOSIS — I639 Cerebral infarction, unspecified: Secondary | ICD-10-CM | POA: Diagnosis not present

## 2020-01-06 DIAGNOSIS — E7800 Pure hypercholesterolemia, unspecified: Secondary | ICD-10-CM | POA: Diagnosis not present

## 2020-01-06 DIAGNOSIS — G609 Hereditary and idiopathic neuropathy, unspecified: Secondary | ICD-10-CM | POA: Diagnosis not present

## 2020-01-06 DIAGNOSIS — C61 Malignant neoplasm of prostate: Secondary | ICD-10-CM | POA: Diagnosis not present

## 2020-01-06 DIAGNOSIS — I10 Essential (primary) hypertension: Secondary | ICD-10-CM | POA: Diagnosis not present

## 2020-01-06 DIAGNOSIS — E1165 Type 2 diabetes mellitus with hyperglycemia: Secondary | ICD-10-CM | POA: Diagnosis not present

## 2020-01-06 DIAGNOSIS — N189 Chronic kidney disease, unspecified: Secondary | ICD-10-CM | POA: Diagnosis not present

## 2020-01-06 DIAGNOSIS — E669 Obesity, unspecified: Secondary | ICD-10-CM | POA: Diagnosis not present

## 2020-01-20 DIAGNOSIS — I10 Essential (primary) hypertension: Secondary | ICD-10-CM | POA: Diagnosis not present

## 2020-01-20 DIAGNOSIS — N400 Enlarged prostate without lower urinary tract symptoms: Secondary | ICD-10-CM | POA: Diagnosis not present

## 2020-01-20 DIAGNOSIS — E782 Mixed hyperlipidemia: Secondary | ICD-10-CM | POA: Diagnosis not present

## 2020-01-20 DIAGNOSIS — N183 Chronic kidney disease, stage 3 unspecified: Secondary | ICD-10-CM | POA: Diagnosis not present

## 2020-01-20 DIAGNOSIS — K219 Gastro-esophageal reflux disease without esophagitis: Secondary | ICD-10-CM | POA: Diagnosis not present

## 2020-01-20 DIAGNOSIS — D649 Anemia, unspecified: Secondary | ICD-10-CM | POA: Diagnosis not present

## 2020-01-20 DIAGNOSIS — E1140 Type 2 diabetes mellitus with diabetic neuropathy, unspecified: Secondary | ICD-10-CM | POA: Diagnosis not present

## 2020-01-20 DIAGNOSIS — I209 Angina pectoris, unspecified: Secondary | ICD-10-CM | POA: Diagnosis not present

## 2020-01-20 DIAGNOSIS — C61 Malignant neoplasm of prostate: Secondary | ICD-10-CM | POA: Diagnosis not present

## 2020-01-23 DIAGNOSIS — E668 Other obesity: Secondary | ICD-10-CM | POA: Diagnosis not present

## 2020-01-23 DIAGNOSIS — I10 Essential (primary) hypertension: Secondary | ICD-10-CM | POA: Diagnosis not present

## 2020-01-23 DIAGNOSIS — E1165 Type 2 diabetes mellitus with hyperglycemia: Secondary | ICD-10-CM | POA: Diagnosis not present

## 2020-01-27 DIAGNOSIS — M5416 Radiculopathy, lumbar region: Secondary | ICD-10-CM | POA: Diagnosis not present

## 2020-01-27 DIAGNOSIS — N183 Chronic kidney disease, stage 3 unspecified: Secondary | ICD-10-CM | POA: Diagnosis not present

## 2020-01-27 DIAGNOSIS — E1140 Type 2 diabetes mellitus with diabetic neuropathy, unspecified: Secondary | ICD-10-CM | POA: Diagnosis not present

## 2020-01-28 ENCOUNTER — Ambulatory Visit: Payer: Medicare Other | Admitting: Interventional Cardiology

## 2020-01-29 ENCOUNTER — Ambulatory Visit: Payer: Medicare Other | Admitting: Interventional Cardiology

## 2020-02-05 DIAGNOSIS — E1042 Type 1 diabetes mellitus with diabetic polyneuropathy: Secondary | ICD-10-CM | POA: Diagnosis not present

## 2020-02-11 DIAGNOSIS — H02831 Dermatochalasis of right upper eyelid: Secondary | ICD-10-CM | POA: Diagnosis not present

## 2020-02-11 DIAGNOSIS — H0279 Other degenerative disorders of eyelid and periocular area: Secondary | ICD-10-CM | POA: Diagnosis not present

## 2020-02-11 DIAGNOSIS — H02413 Mechanical ptosis of bilateral eyelids: Secondary | ICD-10-CM | POA: Diagnosis not present

## 2020-02-11 DIAGNOSIS — H04123 Dry eye syndrome of bilateral lacrimal glands: Secondary | ICD-10-CM | POA: Diagnosis not present

## 2020-02-11 DIAGNOSIS — H53483 Generalized contraction of visual field, bilateral: Secondary | ICD-10-CM | POA: Diagnosis not present

## 2020-02-11 DIAGNOSIS — H02423 Myogenic ptosis of bilateral eyelids: Secondary | ICD-10-CM | POA: Diagnosis not present

## 2020-02-11 DIAGNOSIS — H57813 Brow ptosis, bilateral: Secondary | ICD-10-CM | POA: Diagnosis not present

## 2020-02-11 DIAGNOSIS — H02834 Dermatochalasis of left upper eyelid: Secondary | ICD-10-CM | POA: Diagnosis not present

## 2020-02-12 DIAGNOSIS — M79606 Pain in leg, unspecified: Secondary | ICD-10-CM | POA: Diagnosis not present

## 2020-02-12 DIAGNOSIS — Z79891 Long term (current) use of opiate analgesic: Secondary | ICD-10-CM | POA: Diagnosis not present

## 2020-02-12 DIAGNOSIS — G894 Chronic pain syndrome: Secondary | ICD-10-CM | POA: Diagnosis not present

## 2020-02-12 DIAGNOSIS — M47816 Spondylosis without myelopathy or radiculopathy, lumbar region: Secondary | ICD-10-CM | POA: Diagnosis not present

## 2020-02-12 DIAGNOSIS — Z79899 Other long term (current) drug therapy: Secondary | ICD-10-CM | POA: Diagnosis not present

## 2020-02-12 DIAGNOSIS — M5459 Other low back pain: Secondary | ICD-10-CM | POA: Diagnosis not present

## 2020-02-19 DIAGNOSIS — E669 Obesity, unspecified: Secondary | ICD-10-CM | POA: Diagnosis not present

## 2020-02-19 DIAGNOSIS — E7800 Pure hypercholesterolemia, unspecified: Secondary | ICD-10-CM | POA: Diagnosis not present

## 2020-02-19 DIAGNOSIS — E1165 Type 2 diabetes mellitus with hyperglycemia: Secondary | ICD-10-CM | POA: Diagnosis not present

## 2020-02-19 DIAGNOSIS — I10 Essential (primary) hypertension: Secondary | ICD-10-CM | POA: Diagnosis not present

## 2020-02-23 DIAGNOSIS — H53483 Generalized contraction of visual field, bilateral: Secondary | ICD-10-CM | POA: Diagnosis not present

## 2020-03-01 DIAGNOSIS — E1140 Type 2 diabetes mellitus with diabetic neuropathy, unspecified: Secondary | ICD-10-CM | POA: Diagnosis not present

## 2020-03-01 DIAGNOSIS — D649 Anemia, unspecified: Secondary | ICD-10-CM | POA: Diagnosis not present

## 2020-03-01 DIAGNOSIS — C61 Malignant neoplasm of prostate: Secondary | ICD-10-CM | POA: Diagnosis not present

## 2020-03-01 DIAGNOSIS — K219 Gastro-esophageal reflux disease without esophagitis: Secondary | ICD-10-CM | POA: Diagnosis not present

## 2020-03-01 DIAGNOSIS — I208 Other forms of angina pectoris: Secondary | ICD-10-CM | POA: Diagnosis not present

## 2020-03-01 DIAGNOSIS — I10 Essential (primary) hypertension: Secondary | ICD-10-CM | POA: Diagnosis not present

## 2020-03-01 DIAGNOSIS — I2510 Atherosclerotic heart disease of native coronary artery without angina pectoris: Secondary | ICD-10-CM | POA: Diagnosis not present

## 2020-03-01 DIAGNOSIS — E782 Mixed hyperlipidemia: Secondary | ICD-10-CM | POA: Diagnosis not present

## 2020-03-01 DIAGNOSIS — N183 Chronic kidney disease, stage 3 unspecified: Secondary | ICD-10-CM | POA: Diagnosis not present

## 2020-03-01 DIAGNOSIS — F329 Major depressive disorder, single episode, unspecified: Secondary | ICD-10-CM | POA: Diagnosis not present

## 2020-03-01 DIAGNOSIS — N400 Enlarged prostate without lower urinary tract symptoms: Secondary | ICD-10-CM | POA: Diagnosis not present

## 2020-03-01 DIAGNOSIS — I209 Angina pectoris, unspecified: Secondary | ICD-10-CM | POA: Diagnosis not present

## 2020-03-04 ENCOUNTER — Encounter: Payer: Self-pay | Admitting: Interventional Cardiology

## 2020-03-04 ENCOUNTER — Other Ambulatory Visit: Payer: Self-pay

## 2020-03-04 ENCOUNTER — Ambulatory Visit (INDEPENDENT_AMBULATORY_CARE_PROVIDER_SITE_OTHER): Payer: Medicare Other | Admitting: Interventional Cardiology

## 2020-03-04 VITALS — BP 148/86 | HR 63 | Ht 65.0 in | Wt 215.2 lb

## 2020-03-04 DIAGNOSIS — I10 Essential (primary) hypertension: Secondary | ICD-10-CM | POA: Diagnosis not present

## 2020-03-04 DIAGNOSIS — I25118 Atherosclerotic heart disease of native coronary artery with other forms of angina pectoris: Secondary | ICD-10-CM

## 2020-03-04 DIAGNOSIS — G4733 Obstructive sleep apnea (adult) (pediatric): Secondary | ICD-10-CM | POA: Diagnosis not present

## 2020-03-04 DIAGNOSIS — Z7901 Long term (current) use of anticoagulants: Secondary | ICD-10-CM

## 2020-03-04 DIAGNOSIS — M7989 Other specified soft tissue disorders: Secondary | ICD-10-CM | POA: Diagnosis not present

## 2020-03-04 DIAGNOSIS — E1159 Type 2 diabetes mellitus with other circulatory complications: Secondary | ICD-10-CM

## 2020-03-04 NOTE — Patient Instructions (Signed)
Medication Instructions:  Your physician recommends that you continue on your current medications as directed. Please refer to the Current Medication list given to you today.  *If you need a refill on your cardiac medications before your next appointment, please call your pharmacy*   Lab Work: none If you have labs (blood work) drawn today and your tests are completely normal, you will receive your results only by: Marland Kitchen MyChart Message (if you have MyChart) OR . A paper copy in the mail If you have any lab test that is abnormal or we need to change your treatment, we will call you to review the results.   Testing/Procedures: none   Follow-Up: At [facility], you and your health needs are our priority.  As part of our continuing mission to provide you with exceptional heart care, we have created designated Provider Care Teams.  These Care Teams include your primary Cardiologist (physician) and Advanced Practice Providers (APPs -  Physician Assistants and Nurse Practitioners) who all work together to provide you with the care you need, when you need it.  We recommend signing up for the patient portal called "MyChart".  Sign up information is provided on this After Visit Summary.  MyChart is used to connect with patients for Virtual Visits (Telemedicine).  Patients are able to view lab/test results, encounter notes, upcoming appointments, etc.  Non-urgent messages can be sent to your provider as well.   To learn more about what you can do with MyChart, go to [email or web address].    Your next appointment:   9 month(s)  The format for your next appointment:   In Person  Provider:   You may see Larae Grooms, MD or one of the following Advanced Practice Providers on your designated Care Team:    Melina Copa, PA-C  Ermalinda Barrios, PA-C    Other Instructions  High-Fiber Eating Plan Fiber, also called dietary fiber, is a type of carbohydrate. It is found foods such as fruits,  vegetables, whole grains, and beans. A high-fiber diet can have many health benefits. Your health care provider may recommend a high-fiber diet to help:  Prevent constipation. Fiber can make your bowel movements more regular.  Lower your cholesterol.  Relieve the following conditions: ? Inflammation of veins in the anus (hemorrhoids). ? Inflammation of specific areas of the digestive tract (uncomplicated diverticulosis). ? A problem of the large intestine, also called the colon, that sometimes causes pain and diarrhea (irritable bowel syndrome, or IBS).  Prevent overeating as part of a weight-loss plan.  Prevent heart disease, type 2 diabetes, and certain cancers. What are tips for following this plan? Reading food labels  Check the nutrition facts label on food products for the amount of dietary fiber. Choose foods that have 5 grams of fiber or more per serving.  The goals for recommended daily fiber intake include: ? Men (age 72 or younger): 34-38 g. ? Men (over age 59): 28-34 g. ? Women (age 35 or younger): 25-28 g. ? Women (over age 26): 22-25 g. Your daily fiber goal is _____________ g.   Shopping  Choose whole fruits and vegetables instead of processed forms, such as apple juice or applesauce.  Choose a wide variety of high-fiber foods such as avocados, lentils, oats, and kidney beans.  Read the nutrition facts label of the foods you choose. Be aware of foods with added fiber. These foods often have high sugar and sodium amounts per serving. Cooking  Use whole-grain flour for baking and cooking.  Cook with brown rice instead of white rice. Meal planning  Start the day with a breakfast that is high in fiber, such as a cereal that contains 5 g of fiber or more per serving.  Eat breads and cereals that are made with whole-grain flour instead of refined flour or white flour.  Eat brown rice, bulgur wheat, or millet instead of white rice.  Use beans in place of meat in  soups, salads, and pasta dishes.  Be sure that half of the grains you eat each day are whole grains. General information  You can get the recommended daily intake of dietary fiber by: ? Eating a variety of fruits, vegetables, grains, nuts, and beans. ? Taking a fiber supplement if you are not able to take in enough fiber in your diet. It is better to get fiber through food than from a supplement.  Gradually increase how much fiber you consume. If you increase your intake of dietary fiber too quickly, you may have bloating, cramping, or gas.  Drink plenty of water to help you digest fiber.  Choose high-fiber snacks, such as berries, raw vegetables, nuts, and popcorn. What foods should I eat? Fruits Berries. Pears. Apples. Oranges. Avocado. Prunes and raisins. Dried figs. Vegetables Sweet potatoes. Spinach. Kale. Artichokes. Cabbage. Broccoli. Cauliflower. Green peas. Carrots. Squash. Grains Whole-grain breads. Multigrain cereal. Oats and oatmeal. Brown rice. Barley. Bulgur wheat. Millet. Quinoa. Bran muffins. Popcorn. Rye wafer crackers. Meats and other proteins Navy beans, kidney beans, and pinto beans. Soybeans. Split peas. Lentils. Nuts and seeds. Dairy Fiber-fortified yogurt. Beverages Fiber-fortified soy milk. Fiber-fortified orange juice. Other foods Fiber bars. The items listed above may not be a complete list of recommended foods and beverages. Contact a dietitian for more information. What foods should I avoid? Fruits Fruit juice. Cooked, strained fruit. Vegetables Fried potatoes. Canned vegetables. Well-cooked vegetables. Grains White bread. Pasta made with refined flour. White rice. Meats and other proteins Fatty cuts of meat. Fried chicken or fried fish. Dairy Milk. Yogurt. Cream cheese. Sour cream. Fats and oils Butters. Beverages Soft drinks. Other foods Cakes and pastries. The items listed above may not be a complete list of foods and beverages to avoid.  Talk with your dietitian about what choices are best for you. Summary  Fiber is a type of carbohydrate. It is found in foods such as fruits, vegetables, whole grains, and beans.  A high-fiber diet has many benefits. It can help to prevent constipation, lower blood cholesterol, aid weight loss, and reduce your risk of heart disease, diabetes, and certain cancers.  Increase your intake of fiber gradually. Increasing fiber too quickly may cause cramping, bloating, and gas. Drink plenty of water while you increase the amount of fiber you consume.  The best sources of fiber include whole fruits and vegetables, whole grains, nuts, seeds, and beans. This information is not intended to replace advice given to you by your health care provider. Make sure you discuss any questions you have with your health care provider. Document Revised: 05/01/2019 Document Reviewed: 05/01/2019 Elsevier Patient Education  2021 Elsevier Inc.   

## 2020-03-04 NOTE — Progress Notes (Signed)
Cardiology Office Note   Date:  03/04/2020   ID:  Steve Brown, DOB February 22, 1940, MRN [id number]  PCP:  Lawerance Cruel, MD    No chief complaint on file.  CAD  Wt Readings from Last 3 Encounters:  03/04/20 215 lb 3.2 oz (97.6 kg)  12/01/19 208 lb (94.3 kg)  09/09/19 204 lb (92.5 kg)       History of Present Illness: Steve Brown is a 80 y.o. male  With a h/o CAD. He has an LADDES and moderate circumflexdisease. Per the record: He has a "past medical history significant for CAD s/p LAD stent 2010, bilateral PE 07/2012 on Xarelto, diabetes type 2, prostate cancer diagnosed in 2018- treated with radiation, OSA on CPAP, hypertension, hyperlipidemia, history of TIAs and stroke, CKD stage II. Mr. Stites had a stent to the LAD in 2010, 2012 cath showed patent stent and no significant CAD, cardiac cath in 2015showed moderate disease which was negative by FFR. His most recent echocardiogram in 04/2016 showed normal LV systolic function with EF 60-65 percent, normal wall motion, grade 2 diastolic dysfunction, mild AR, mildly dilated aortic root 39 mm, mild MR and increased thickness of the septum consistent with lipomatous hypertrophy."  Last cath in 2019 showed:  "Acute Mrg lesion is 25% stenosed.  Mid RCA lesion is 25% stenosed.  2nd Mrg lesion is 40% stenosed.  Mid Cx lesion is 40% stenosed.  Previously placed Mid LAD stent (unknown type) is widely patent.  Prox LAD-1 lesion is 25% stenosed.  Prox LAD-2 lesion is 25% stenosed.  The left ventricular systolic function is normal.  LV end diastolic pressure is normal.  The left ventricular ejection fraction is 55-65% by visual estimate.  There is no aortic valve stenosis.  Patent stent. Nonobstructive disease. Would investigate noncardiac causes of fatigue."  He had a TIA affecting the left side of his body in 9/19. MRI was OK.  In Jan 2020, He reported: " Dizziness: He has seen ENT before, but he  did not want to consider surgery. I discussed sending him to Dr. Benjamine Mola for rehab options. He is agreeable."  Had some volume overload.  ECho in 7/21 showed: "Left ventricular ejection fraction, by estimation,  is 60 to 65%. The left ventricle has normal function. The left ventricle  has no regional wall motion abnormalities. Left ventricular diastolic  parameters were normal.  2. Right ventricular systolic function is normal. The right ventricular  size is normal. There is mildly elevated pulmonary artery systolic  pressure.  3. The mitral valve is normal in structure. Trivial mitral valve  regurgitation. No evidence of mitral stenosis.  4. The aortic valve is tricuspid. Aortic valve regurgitation is trivial.  Mild aortic valve sclerosis is present, with no evidence of aortic valve  stenosis.  5. Mild pulmonic stenosis.  6. The inferior vena cava is normal in size with greater than 50%  respiratory variability, suggesting right atrial pressure of 3 mmHg. "  Seen in ER 8/21 for fall:"chief complaint of a fall.  Patient states that his right knee has been giving him trouble and has been causing him severe pain at times and caused him to fall to the ground once or twice.  Patient had an episode like this last night and collapsed to the ground and struck the back of his head.  Complaining of pain to the low back and not really of pain to the right knee.  Pain to the left shoulder.  He called his  family doctor who suggested he come here for evaluation.  He is on Xarelto."  Had negative head a spine CT.  He has resumed some walking.  GOes to the [facility]-3x/week.  Feels well when exercising.  Limited by back pain  SInce the ER visit, he has done well.  Denies : Chest pain. Dizziness. Leg edema. Nitroglycerin use. Orthopnea. Palpitations. Paroxysmal nocturnal dyspnea. Shortness of breath. Syncope.     Past Medical History:  Diagnosis Date  . Abnormal prostate biopsy   . Anticoagulant  long-term use    currently xarelto  . BPH with elevated PSA   . CKD (chronic kidney disease), stage II   . Complication of anesthesia    limted neck rom limited use of left arm due to cva  . Coronary artery disease    CARDIOLOGIST-  DR Irish Lack--  2010-- PCI w/ stenting midLAD  . DDD (degenerative disc disease), lumbar   . Degeneration of cervical intervertebral disc   . Depression   . Dyspnea on exertion   . GERD (gastroesophageal reflux disease)   . Hemiparesis due to cerebral infarction   . History of cerebrovascular accident (CVA) with residual deficit 2002 and 2003--  hemiparisis both sides   per MRI  anterior left frontal lobe, left para midline pons, and inferior cerebullam bilaterally infarcts  . History of pulmonary embolus (PE)    06-30-2012  extensive bilaterally  . History of recurrent TIAs   . History of syncope    hx multiple pre-syncope and syncopal episodes due to vasovagal, orthostatic hypotension, dehydration  . History of TIAs    several since 2002  . Hyperlipidemia   . Hypertension   . Mild atherosclerosis of carotid artery, bilateral    per last duplex 11-04-2014  bilateral ICA 1--39%  . Neuropathy    fingers  . OSA on CPAP    followed by dr dohmeier--  sev. osa w/ AHI 65.9  . Prostate cancer ([place]) dx 2018  . Renal insufficiency   . S/P coronary artery stent placement 2010   stenting to mid LAD  . Simple renal cyst    bilaterally  . Stroke ([place])   . Trigger finger of both hands 11-17-13  . Type 2 diabetes mellitus ([place]) dx 1986   last one A1c 9.2 on 04-26-2016  . Unsteady gait    . Hx prior CVA/TIAs;  . Vertebral artery occlusion, left    chronic    Past Surgical History:  Procedure Laterality Date  . ANTERIOR CERVICAL DECOMP/DISCECTOMY FUSION  2004   C3 -- C6 limited rom  . CARDIAC CATHETERIZATION  06-10-2010   dr Irish Lack   wide patent LAD stent, mid lesion at the origin of the septal prior to the previous stent 40-50%/  normal LVF, ef 55%  .  CARDIOVASCULAR STRESS TEST  10-23-2012  dr Irish Lack   normal nuclear perfusion study w/ no ischemia/  normal LV function and wall motion , ef 65%  . CARPAL TUNNEL RELEASE Bilateral   . CATARACT EXTRACTION W/ INTRAOCULAR LENS  IMPLANT, BILATERAL    . CHOLECYSTECTOMY N/A 11/02/2015   Procedure: LAPAROSCOPIC CHOLECYSTECTOMY WITH INTRAOPERATIVE CHOLANGIOGRAM;  Surgeon: Donnie Mesa, MD;  Location: [facility];  Service: General;  Laterality: N/A;  . COLONOSCOPY    . CORONARY ANGIOPLASTY WITH STENT PLACEMENT  02/2008   stenting to mid LAD  . GOLD SEED IMPLANT N/A 11/15/2016   Procedure: GOLD SEED IMPLANT TIMES THREE;  Surgeon: Ardis Hughs, MD;  Location: [facility];  Service: Urology;  Laterality: N/A;  . IR ANGIO INTRA EXTRACRAN SEL COM CAROTID INNOMINATE BILAT MOD SED  06/13/2018  . IR ANGIO VERTEBRAL SEL VERTEBRAL UNI R MOD SED  06/13/2018  . IR US GUIDE VASC ACCESS RIGHT  06/13/2018  . LEFT HEART CATH AND CORONARY ANGIOGRAPHY N/A 05/25/2017   Procedure: LEFT HEART CATH AND CORONARY ANGIOGRAPHY;  Surgeon: Jettie Booze, MD;  Location: [facility];  Service: Cardiovascular;  Laterality: N/A;  . LEFT HEART CATHETERIZATION WITH CORONARY ANGIOGRAM N/A 04/03/2013   Procedure: LEFT HEART CATHETERIZATION WITH CORONARY ANGIOGRAM;  Surgeon: Jettie Booze, MD;  Location: [facility];  Service: Cardiovascular;  Laterality: N/A;  patent mLAD stent  w/ mild disease in remainder LAD and its branches;  mod. focal lesion midLCFx- FFR of lesion was negative for ischemia/  normal LVSF, ef 50%  . lungs  2005   "fluid pumped off lungs"  . NEUROPLASTY / TRANSPOSITION ULNAR NERVE AT ELBOW Right 2004  . PROSTATE BIOPSY N/A 08/31/2016   Procedure: PROSTATE  BIOPSY TRANSRECTAL ULTRASONIC PROSTATE (TUBP);  Surgeon: Ardis Hughs, MD;  Location: [facility];  Service: Urology;  Laterality: N/A;  . SPACE OAR INSTILLATION N/A 11/15/2016   Procedure: SPACE OAR INSTILLATION;   Surgeon: Ardis Hughs, MD;  Location: [facility];  Service: Urology;  Laterality: N/A;  . TRANSTHORACIC ECHOCARDIOGRAM  04/27/2016   severe focal basal LVH, ef 60-65%,  grade 2 diastoilc dysfunction/  mild AR, MR, and TR/  atrial septum lipomatous hypertrophy/  PASP 73mmHg  . UMBILICAL HERNIA REPAIR       Current Outpatient Medications  Medication Sig Dispense Refill  . acetaminophen (TYLENOL) 500 MG tablet Take 1,000 mg by mouth every 6 (six) hours as needed (pain).     . Ascorbic Acid (VITAMIN C PO) Take 1 tablet by mouth daily.    . cholecalciferol (VITAMIN D) 1000 UNITS tablet Take 1,000 Units by mouth daily.    . diclofenac Sodium (VOLTAREN) 1 % GEL Apply 4 g topically 4 (four) times daily. 100 g 0  . Echinacea 450 MG CAPS Take 450 mg by mouth daily.     . fluticasone (FLONASE) 50 MCG/ACT nasal spray Place 1 spray into both nostrils 2 (two) times daily as needed for allergies.   0  . furosemide (LASIX) 20 MG tablet Take 1-2 tablets daily as needed for swelling. (Patient taking differently: Take 20 mg by mouth daily.) 90 tablet 3  . insulin lispro (HUMALOG) 100 UNIT/ML KiwkPen Inject 6-10 Units into the skin 3 (three) times daily with meals.    Marland Kitchen JANUVIA 50 MG tablet Take 50 mg by mouth daily.    Marland Kitchen losartan (COZAAR) 100 MG tablet Take 100 mg by mouth daily.    . modafinil (PROVIGIL) 100 MG tablet Take 1 tablet (100 mg total) by mouth daily. 30 tablet 5  . Multiple Vitamin (MULTIVITAMIN WITH MINERALS) TABS tablet Take 1 tablet by mouth daily.    . nitroGLYCERIN (NITROSTAT) 0.4 MG SL tablet Place 1 tablet (0.4 mg total) under the tongue every 5 (five) minutes as needed for chest pain. 25 tablet 12  . pregabalin (LYRICA) 300 MG capsule Take 1 capsule (300 mg total) by mouth 2 (two) times daily. 180 capsule 3  . rivaroxaban (XARELTO) 20 MG TABS tablet Take 20 mg by mouth daily.     . rosuvastatin (CRESTOR) 10 MG tablet Take 1 tablet (10 mg total) by mouth daily. [place]  tablet 3  . TRESIBA FLEXTOUCH 100 UNIT/ML SOPN FlexTouch Pen Inject 26 Units into the skin every morning.     . valACYclovir (VALTREX) 500 MG tablet Take 500 mg by mouth daily.    Marland Kitchen zinc gluconate 50 MG tablet Take 50 mg by mouth daily.     No current facility-administered medications for this visit.    Allergies:   Other, Phenergan [promethazine], Iohexol, and Tramadol    Social History:  The patient  reports that he has never smoked. He has never used smokeless tobacco. He reports that he does not drink alcohol and does not use drugs.   Family History:  The patient's family history includes Aneurysm in his mother; Cancer in his father; Dementia in his sister; Stroke in his brother.    ROS:  Please see the history of present illness.   Otherwise, review of systems are positive for back pain.   All other systems are reviewed and negative.    PHYSICAL EXAM: VS:  BP (!) 148/86   Pulse 63   Ht 5\' 5"  (1.651 m)   Wt 215 lb 3.2 oz (97.6 kg)   SpO2 94%   BMI 35.81 kg/m  , BMI Body mass index is 35.81 kg/m. GEN: Well nourished, well developed, in no acute distress  HEENT: normal  Neck: no JVD, carotid bruits, or masses Cardiac: RRR; no murmurs, rubs, or gallops,no edema  Respiratory:  clear to auscultation bilaterally, normal work of breathing GI: soft, nontender, nondistended, + BS MS: no deformity or atrophy  Skin: warm and dry, no rash Neuro:  Strength and sensation are intact Psych: euthymic mood, full affect   EKG:   The ekg ordered today demonstrates NSR, no ST segment changes   Recent Labs: 08/06/2019: NT-Pro BNP 93 08/11/2019: B Natriuretic Peptide 70.3 08/12/2019: Magnesium 1.7; TSH 1.536 08/13/2019: Hemoglobin 12.9; Platelets 222 08/22/2019: BUN 29; Creatinine, Ser 1.36; Potassium 4.4; Sodium 135 11/24/2019: ALT 21   Lipid Panel    Component Value Date/Time   CHOL 139 11/24/2019 0944   TRIG 70 11/24/2019 0944   HDL 53 11/24/2019 0944   CHOLHDL 2.6 11/24/2019 0944    CHOLHDL 4.6 08/12/2019 0424   VLDL 21 08/12/2019 0424   LDLCALC 72 11/24/2019 0944     Other studies Reviewed: Additional studies/ records that were reviewed today with results demonstrating: LDL 72 in November 2021..   ASSESSMENT AND PLAN:  1. CAD: No angina.  Continue aggressive secondary prevention. Healthy diet.  Whole food, plant based diet.  Continue Crestor. 2. LE edema: Elevate legs. Could use compression stockings.  3. DM: A1C 7.9 in 12/21.  Avoid processed foods.  4. HTN: Increased today.  Needs to try to lose weight.  Controlled at home.   5. OSA: Using CPAP.  Weight loss will also be beneficial for his sleep apnea.  Since he lives alone, he has less motivation to cook.  He feels this adds to some unhealthy dietary choices. 6. Anticoagulated: No bleeding problems with Xarelto.  Watch for bleeding.    Current medicines are reviewed at length with the patient today.  The patient concerns regarding his medicines were addressed.  The following changes have been made:  No change  Labs/ tests ordered today include:  No orders of the defined types were placed in this encounter.   Recommend 150 minutes/week of aerobic exercise Low fat, low carb, high fiber diet recommended  Disposition:   FU in 9 months   Signed, Larae Grooms, MD  03/04/2020 [place], [place], [place]  [id number] Phone: [phone]; Fax: [phone]

## 2020-03-11 DIAGNOSIS — M542 Cervicalgia: Secondary | ICD-10-CM | POA: Diagnosis not present

## 2020-03-11 DIAGNOSIS — M79606 Pain in leg, unspecified: Secondary | ICD-10-CM | POA: Diagnosis not present

## 2020-03-11 DIAGNOSIS — M47816 Spondylosis without myelopathy or radiculopathy, lumbar region: Secondary | ICD-10-CM | POA: Diagnosis not present

## 2020-03-11 DIAGNOSIS — Z79899 Other long term (current) drug therapy: Secondary | ICD-10-CM | POA: Diagnosis not present

## 2020-03-11 DIAGNOSIS — Z79891 Long term (current) use of opiate analgesic: Secondary | ICD-10-CM | POA: Diagnosis not present

## 2020-03-11 DIAGNOSIS — G894 Chronic pain syndrome: Secondary | ICD-10-CM | POA: Diagnosis not present

## 2020-04-05 DIAGNOSIS — I10 Essential (primary) hypertension: Secondary | ICD-10-CM | POA: Diagnosis not present

## 2020-04-05 DIAGNOSIS — E1165 Type 2 diabetes mellitus with hyperglycemia: Secondary | ICD-10-CM | POA: Diagnosis not present

## 2020-04-12 DIAGNOSIS — H02834 Dermatochalasis of left upper eyelid: Secondary | ICD-10-CM | POA: Diagnosis not present

## 2020-04-12 DIAGNOSIS — H02831 Dermatochalasis of right upper eyelid: Secondary | ICD-10-CM | POA: Diagnosis not present

## 2020-04-12 DIAGNOSIS — H02413 Mechanical ptosis of bilateral eyelids: Secondary | ICD-10-CM | POA: Diagnosis not present

## 2020-04-12 DIAGNOSIS — H02423 Myogenic ptosis of bilateral eyelids: Secondary | ICD-10-CM | POA: Diagnosis not present

## 2020-04-19 DIAGNOSIS — E1151 Type 2 diabetes mellitus with diabetic peripheral angiopathy without gangrene: Secondary | ICD-10-CM | POA: Diagnosis not present

## 2020-04-19 DIAGNOSIS — I739 Peripheral vascular disease, unspecified: Secondary | ICD-10-CM | POA: Diagnosis not present

## 2020-04-19 DIAGNOSIS — L603 Nail dystrophy: Secondary | ICD-10-CM | POA: Diagnosis not present

## 2020-04-20 DIAGNOSIS — Z8546 Personal history of malignant neoplasm of prostate: Secondary | ICD-10-CM | POA: Diagnosis not present

## 2020-04-21 DIAGNOSIS — M79605 Pain in left leg: Secondary | ICD-10-CM | POA: Diagnosis not present

## 2020-04-21 DIAGNOSIS — R109 Unspecified abdominal pain: Secondary | ICD-10-CM | POA: Diagnosis not present

## 2020-04-26 DIAGNOSIS — I2510 Atherosclerotic heart disease of native coronary artery without angina pectoris: Secondary | ICD-10-CM | POA: Diagnosis not present

## 2020-04-26 DIAGNOSIS — K219 Gastro-esophageal reflux disease without esophagitis: Secondary | ICD-10-CM | POA: Diagnosis not present

## 2020-04-26 DIAGNOSIS — F329 Major depressive disorder, single episode, unspecified: Secondary | ICD-10-CM | POA: Diagnosis not present

## 2020-04-26 DIAGNOSIS — C61 Malignant neoplasm of prostate: Secondary | ICD-10-CM | POA: Diagnosis not present

## 2020-04-26 DIAGNOSIS — E1140 Type 2 diabetes mellitus with diabetic neuropathy, unspecified: Secondary | ICD-10-CM | POA: Diagnosis not present

## 2020-04-26 DIAGNOSIS — I208 Other forms of angina pectoris: Secondary | ICD-10-CM | POA: Diagnosis not present

## 2020-04-26 DIAGNOSIS — I10 Essential (primary) hypertension: Secondary | ICD-10-CM | POA: Diagnosis not present

## 2020-04-26 DIAGNOSIS — D649 Anemia, unspecified: Secondary | ICD-10-CM | POA: Diagnosis not present

## 2020-04-26 DIAGNOSIS — I209 Angina pectoris, unspecified: Secondary | ICD-10-CM | POA: Diagnosis not present

## 2020-04-26 DIAGNOSIS — N183 Chronic kidney disease, stage 3 unspecified: Secondary | ICD-10-CM | POA: Diagnosis not present

## 2020-04-26 DIAGNOSIS — E782 Mixed hyperlipidemia: Secondary | ICD-10-CM | POA: Diagnosis not present

## 2020-04-26 DIAGNOSIS — N400 Enlarged prostate without lower urinary tract symptoms: Secondary | ICD-10-CM | POA: Diagnosis not present

## 2020-04-27 ENCOUNTER — Ambulatory Visit: Payer: Medicare Other | Attending: Family Medicine

## 2020-04-27 ENCOUNTER — Other Ambulatory Visit: Payer: Self-pay

## 2020-04-27 DIAGNOSIS — R2689 Other abnormalities of gait and mobility: Secondary | ICD-10-CM | POA: Diagnosis not present

## 2020-04-27 DIAGNOSIS — M6281 Muscle weakness (generalized): Secondary | ICD-10-CM | POA: Diagnosis not present

## 2020-04-27 DIAGNOSIS — M5432 Sciatica, left side: Secondary | ICD-10-CM | POA: Diagnosis not present

## 2020-04-27 DIAGNOSIS — R3915 Urgency of urination: Secondary | ICD-10-CM | POA: Diagnosis not present

## 2020-04-27 DIAGNOSIS — R2681 Unsteadiness on feet: Secondary | ICD-10-CM | POA: Insufficient documentation

## 2020-04-27 DIAGNOSIS — Z8546 Personal history of malignant neoplasm of prostate: Secondary | ICD-10-CM | POA: Diagnosis not present

## 2020-04-27 DIAGNOSIS — N5201 Erectile dysfunction due to arterial insufficiency: Secondary | ICD-10-CM | POA: Diagnosis not present

## 2020-04-27 NOTE — Therapy (Signed)
[facility] [address] [place], [place], [address] Phone: [phone]   Fax:  [phone]  Physical Therapy Evaluation  Patient Details  Name: Steve Brown MRN: [id number] Date of Birth: [birth year] Referring Provider (PT): Lona Kettle MD   Encounter Date: 04/27/2020   PT End of Session - 04/27/20 1336    Visit Number 1    Number of Visits 17    Date for PT Re-Evaluation 06/29/20    Authorization Type [employer] A[employer]    PT Start Time 2947    PT Stop Time 1320    PT Time Calculation (min) 45 min    Activity Tolerance Patient tolerated treatment well           Past Medical History:  Diagnosis Date  . Abnormal prostate biopsy   . Anticoagulant long-term use    currently xarelto  . BPH with elevated PSA   . CKD (chronic kidney disease), stage II   . Complication of anesthesia    limted neck rom limited use of left arm due to cva  . Coronary artery disease    CARDIOLOGIST-  DR Irish Lack--  2010-- PCI w/ stenting midLAD  . DDD (degenerative disc disease), lumbar   . Degeneration of cervical intervertebral disc   . Depression   . Dyspnea on exertion   . GERD (gastroesophageal reflux disease)   . Hemiparesis due to cerebral infarction   . History of cerebrovascular accident (CVA) with residual deficit 2002 and 2003--  hemiparisis both sides   per MRI  anterior left frontal lobe, left para midline pons, and inferior cerebullam bilaterally infarcts  . History of pulmonary embolus (PE)    06-30-2012  extensive bilaterally  . History of recurrent TIAs   . History of syncope    hx multiple pre-syncope and syncopal episodes due to vasovagal, orthostatic hypotension, dehydration  . History of TIAs    several since 2002  . Hyperlipidemia   . Hypertension   . Mild atherosclerosis of carotid artery, bilateral    per last duplex 11-04-2014  bilateral ICA 1--39%  . Neuropathy    fingers  . OSA on CPAP    followed by dr  dohmeier--  sev. osa w/ AHI 65.9  . Prostate cancer ([place]) dx 2018  . Renal insufficiency   . S/P coronary artery stent placement 2010   stenting to mid LAD  . Simple renal cyst    bilaterally  . Stroke ([place])   . Trigger finger of both hands 11-17-13  . Type 2 diabetes mellitus ([place]) dx 1986   last one A1c 9.2 on 04-26-2016  . Unsteady gait    . Hx prior CVA/TIAs;  . Vertebral artery occlusion, left    chronic    Past Surgical History:  Procedure Laterality Date  . ANTERIOR CERVICAL DECOMP/DISCECTOMY FUSION  2004   C3 -- C6 limited rom  . CARDIAC CATHETERIZATION  06-10-2010   dr Irish Lack   wide patent LAD stent, mid lesion at the origin of the septal prior to the previous stent 40-50%/  normal LVF, ef 55%  . CARDIOVASCULAR STRESS TEST  10-23-2012  dr Irish Lack   normal nuclear perfusion study w/ no ischemia/  normal LV function and wall motion , ef 65%  . CARPAL TUNNEL RELEASE Bilateral   . CATARACT EXTRACTION W/ INTRAOCULAR LENS  IMPLANT, BILATERAL    . CHOLECYSTECTOMY N/A 11/02/2015   Procedure: LAPAROSCOPIC CHOLECYSTECTOMY WITH INTRAOPERATIVE CHOLANGIOGRAM;  Surgeon: Donnie Mesa, MD;  Location: [facility];  Service: General;  Laterality: N/A;  . COLONOSCOPY    . CORONARY ANGIOPLASTY WITH STENT PLACEMENT  02/2008   stenting to mid LAD  . GOLD SEED IMPLANT N/A 11/15/2016   Procedure: GOLD SEED IMPLANT TIMES THREE;  Surgeon: Ardis Hughs, MD;  Location: [facility];  Service: Urology;  Laterality: N/A;  . IR ANGIO INTRA EXTRACRAN SEL COM CAROTID INNOMINATE BILAT MOD SED  06/13/2018  . IR ANGIO VERTEBRAL SEL VERTEBRAL UNI R MOD SED  06/13/2018  . IR US GUIDE VASC ACCESS RIGHT  06/13/2018  . LEFT HEART CATH AND CORONARY ANGIOGRAPHY N/A 05/25/2017   Procedure: LEFT HEART CATH AND CORONARY ANGIOGRAPHY;  Surgeon: Jettie Booze, MD;  Location: [facility];  Service: Cardiovascular;  Laterality: N/A;  . LEFT HEART CATHETERIZATION WITH CORONARY ANGIOGRAM N/A  04/03/2013   Procedure: LEFT HEART CATHETERIZATION WITH CORONARY ANGIOGRAM;  Surgeon: Jettie Booze, MD;  Location: [facility];  Service: Cardiovascular;  Laterality: N/A;  patent mLAD stent  w/ mild disease in remainder LAD and its branches;  mod. focal lesion midLCFx- FFR of lesion was negative for ischemia/  normal LVSF, ef 50%  . lungs  2005   "fluid pumped off lungs"  . NEUROPLASTY / TRANSPOSITION ULNAR NERVE AT ELBOW Right 2004  . PROSTATE BIOPSY N/A 08/31/2016   Procedure: PROSTATE  BIOPSY TRANSRECTAL ULTRASONIC PROSTATE (TUBP);  Surgeon: Ardis Hughs, MD;  Location: [facility];  Service: Urology;  Laterality: N/A;  . SPACE OAR INSTILLATION N/A 11/15/2016   Procedure: SPACE OAR INSTILLATION;  Surgeon: Ardis Hughs, MD;  Location: [facility];  Service: Urology;  Laterality: N/A;  . TRANSTHORACIC ECHOCARDIOGRAM  04/27/2016   severe focal basal LVH, ef 60-65%,  grade 2 diastoilc dysfunction/  mild AR, MR, and TR/  atrial septum lipomatous hypertrophy/  PASP 69mmHg  . UMBILICAL HERNIA REPAIR      There were no vitals filed for this visit.    Subjective Assessment - 04/27/20 1331    Subjective reports a long history of LLE pain and stiffness following CVA in 07/2017, he has been in and out of therapy with no lasting relief noted, he has a [employer] but LLE pain limits him from using equipment and the walking track, he reports relief with massage when he can afford it.  Tylenol with Codeine helps but realizes it is not a cure    Pertinent History CVA in 07/2017    Limitations Walking    How long can you sit comfortably? unimited    How long can you stand comfortably? <15 minutes    How long can you walk comfortably? <15 minutes    Currently in Pain? Yes    Pain Score 5     Pain Location Hip    Pain Orientation Left    Pain Descriptors / Indicators Aching;Cramping;Sore    Pain Type Chronic pain    Pain Onset More than a month ago     Pain Frequency Intermittent              [facility] PT Assessment - 04/27/20 0001      Assessment   Medical Diagnosis CVA 07/2017    Referring Provider (PT) Lona Kettle MD    Onset Date/Surgical Date 07/09/17    Hand Dominance Right    Prior Therapy [employer]      Precautions   Precautions Fall      Restrictions   Weight Bearing Restrictions No  Balance Screen   Has the patient fallen in the past 6 months No    Has the patient had a decrease in activity level because of a fear of falling?  No      Home Environment   Living Environment Private residence    Living Arrangements Alone    Type of [place] to enter    [place] One level    Additional Comments 16 steps      Prior Function   Level of Independence Independent    Vocation Retired      Transport planner   Overall Strength Due to pain;Deficits    Overall Strength Comments 4 to 4+/5 BLE strength      Transfers   Transfers Sit to Stand    Five time sit to stand comments  16.90s    Comments from chair w/o UE assist      Ambulation/Gait   Ambulation/Gait Yes    Ambulation/Gait Assistance 5: Supervision    Ambulation/Gait Assistance Details slow cadence    Ambulation Distance (Feet) 50 Feet    Assistive device None    Gait Pattern Decreased arm swing - right;Decreased arm swing - left;Decreased step length - right;Decreased step length - left;Wide base of support      Timed Up and Go Test   Normal TUG (seconds) 14.9          Figure 4 position painful and restricted, tenderness to posterior L hip musculature, no pain with ITB testing, BLE strength appears functional but limited by L hip pain            Objective measurements completed on examination: See above findings.               PT Education - 04/27/20 1335    Education Details [id number]    Person(s) Educated Patient    Methods Explanation;Demonstration;Verbal  cues;Tactile cues;Handout    Comprehension Verbalized understanding;Returned demonstration;Tactile cues required;Verbal cues required;Need further instruction            PT Short Term Goals - 04/27/20 1349      PT SHORT TERM GOAL #1   Title patien to demo HEP back to PT w/o need of VCs    Baseline issued HEP today    Time 4    Period Weeks    Status New    Target Date 05/25/20      PT SHORT TERM GOAL #2   Title Assess BERG test and stablish goal    Baseline UTA due to time constraints    Time 4    Period Weeks    Status New    Target Date 05/25/20      PT SHORT TERM GOAL #3   Title Assess FGA/DGI and establish goals    Baseline UTA due to time constraints    Time 4    Period Weeks    Status New    Target Date 05/25/20      PT SHORT TERM GOAL #4   Title Patient to ambulate 569ft across level ground with LRAD under S    Baseline 44ft clinic distancs w/o need of AD    Time 4    Period Weeks    Status New    Target Date 05/25/20             PT Long Term Goals - 04/27/20 1352  PT LONG TERM GOAL #1   Title Improve TUG score from 14.9 secs to </= 13.5 secs without device to decr. fall risk.    Baseline 14.9s w/o AD    Time 8    Period Weeks    Status New    Target Date 06/22/20      PT LONG TERM GOAL #2   Title Assess progress towards BERG goal    Baseline not set    Time 8    Period Weeks    Status New    Target Date 06/22/20      PT LONG TERM GOAL #3   Title Assess progress towards FGA/DGI    Baseline not set    Time 8    Period Weeks    Status New    Target Date 06/22/20      PT LONG TERM GOAL #4   Title Patient to ambulat 1051ft across all surfaces with LRAD under S    Baseline 20ft across level ground w/o AD    Time 8    Period Weeks    Status New    Target Date 06/22/20                  Plan - 04/27/20 1337    Clinical Impression Statement patient referred to [facility] for chronic L hip and leg pain following CVA 07/2017, he has  been in and out of Physical therapy with no lasting relief.  He presents with soft tissue restrictions in L hip and pain limits ability to return to [facility], he is ambulating w/o need of an AD and able to transfer safely and I.  Patient is a good candidate for skilled rehab services.  Discussed Eval findings, prognosis and POC and patient is in agreement    Personal Factors and Comorbidities Comorbidity 2;Time since onset of injury/illness/exacerbation    Comorbidities CVA, chronic LBP    Examination-Activity Limitations Locomotion Level    Stability/Clinical Decision Making Stable/Uncomplicated    Clinical Decision Making Low    Rehab Potential Good    PT Frequency 2x / week    PT Duration 8 weeks    PT Treatment/Interventions ADLs/Self Care Home Management;Aquatic Therapy;DME Instruction;Gait training;Stair training;Functional mobility training;Therapeutic activities;Therapeutic exercise;Balance training;Neuromuscular re-education;Patient/family education;Orthotic Fit/Training    PT Next Visit Plan Assess DGI/FGA and BERG, review HEP    PT Home Exercise Plan [id number]    Consulted and Agree with Plan of Care Patient           Patient will benefit from skilled therapeutic intervention in order to improve the following deficits and impairments:  Abnormal gait,Decreased range of motion,Difficulty walking,Decreased endurance,Decreased activity tolerance,Decreased balance,Decreased mobility,Decreased strength  Visit Diagnosis: Unsteadiness on feet  Muscle weakness (generalized)  Other abnormalities of gait and mobility     Problem List Patient Active Problem List   Diagnosis Date Noted  . Lumbar spinal stenosis 08/15/2019  . Nonspecific chest pain 08/11/2019  . Shortness of breath   . 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 ([place]) 10/21/2018  . Nocturnal hypoxemia 10/21/2018  . Radiation therapy complication 64/33/2951  . Primary prostate cancer ([place]) 07/31/2017  .  Anemia 07/31/2017  . Vertigo due to cerebrovascular disease 07/31/2017  . Poor compliance with CPAP treatment 07/31/2017  . Absolute anemia 05/16/2017  . Avitaminosis D 05/16/2017  . Benign essential HTN 05/16/2017  . Benign prostatic hypertrophy without urinary obstruction 05/16/2017  . Clinical depression 05/16/2017  . CN (constipation) 05/16/2017  . Current drug use 05/16/2017  .  Diabetic neuropathy ([place]) 05/16/2017  . Genital herpes 05/16/2017  . Infarction of lung due to iatrogenic pulmonary embolism ([place]) 05/16/2017  . Sciatica associated with disorder of lumbar spine 05/16/2017  . Arteriosclerosis of coronary artery 05/16/2017  . Artery disease, cerebral 05/16/2017  . Apnea, sleep 05/16/2017  . Arthralgia of hip or thigh 05/16/2017  . Malignant neoplasm of prostate ([place]) 09/28/2016  . Vascular dementia in remission ([place]) 09/28/2016  . Remote history of stroke 09/28/2016  . Acute kidney injury ([place]) 05/18/2016  . Dehydration 05/18/2016  . Near syncope 05/18/2016  . Orthostatic hypotension 05/18/2016  . CKD (chronic kidney disease), stage II 04/26/2016  . UTI (urinary tract infection) 04/26/2016  . Encounter for counseling on use of CPAP 11/18/2015  . Chronic cholecystitis with calculus 11/02/2015  . Stroke, 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[place]) 04/22/2015  . TIA (transient ischemic attack) 11/03/2014  . OSA on CPAP 05/18/2014  . White matter disease 08/14/2013  . OSA (obstructive sleep apnea) 08/14/2013  . Abnormal x-ray of temporomandibular joint 05/30/2013  . Chronic infection of sinus 05/30/2013  . Cough 05/30/2013  . Fatigue 05/30/2013  . Unsteady gait 05/19/2013  . Combined fat and carbohydrate induced hyperlipemia 04/24/2013  . Syncope 03/20/2013  . Angina pectoris ([place]) 10/08/2012  . Hypertension   . Stroke ([place])   . History of TIAs   . Lumbago   . Other and unspecified hyperlipidemia   . Personal history of unspecified circulatory disease   . Unspecified fall   . Pain  in joint, multiple sites   . Degeneration of cervical intervertebral disc   . Unspecified cardiovascular disease   . History of pulmonary embolism: June 2014,  Takes Xarelto 07/01/2012    Class: History of  . Hemiparesis ([place]) 07/18/2011  . Diabetes mellitus type 2 with complications ([place]) 43/15/4008  . HTN (hypertension) 07/18/2011  . CAD in native artery 07/18/2011  . History of recurrent TIAs 07/18/2011    Lanice Shirts 04/27/2020, 2:21 PM  [facility] [address] [place], [place], [address] Phone: [phone]   Fax:  [phone]  Name: Steve Brown MRN: [id number] Date of Birth: 04-14-1940

## 2020-04-27 NOTE — Patient Instructions (Signed)
Access Code: [email or web address] URL: [email or web address] Date: 04/27/2020 Prepared by: Sharlynn Oliphant  Exercises Supine Bridge - 2 x daily - 7 x weekly - 3 sets - 10 reps Seated Table Hamstring Stretch - 2 x daily - 7 x weekly - 1 sets - 3 reps - 30s hold Hooklying Single Knee to Chest Stretch - 2 x daily - 7 x weekly - 1 sets - 3 reps - 30s hold

## 2020-04-29 ENCOUNTER — Ambulatory Visit: Payer: Medicare Other

## 2020-04-29 ENCOUNTER — Other Ambulatory Visit: Payer: Self-pay

## 2020-04-29 DIAGNOSIS — M5432 Sciatica, left side: Secondary | ICD-10-CM | POA: Diagnosis not present

## 2020-04-29 DIAGNOSIS — M6281 Muscle weakness (generalized): Secondary | ICD-10-CM | POA: Diagnosis not present

## 2020-04-29 DIAGNOSIS — R2681 Unsteadiness on feet: Secondary | ICD-10-CM | POA: Diagnosis not present

## 2020-04-29 DIAGNOSIS — R2689 Other abnormalities of gait and mobility: Secondary | ICD-10-CM

## 2020-04-29 NOTE — Therapy (Signed)
[facility] [address] [place], [place], [address] Phone: [phone]   Fax:  [phone]  Physical Therapy Treatment  Patient Details  Name: Steve Brown MRN: [id number] Date of Birth: [birth year] Referring Provider (PT): Lona Kettle MD   Encounter Date: 04/29/2020   PT End of Session - 04/29/20 1329    Visit Number 2    Number of Visits 17    Date for PT Re-Evaluation 06/29/20    Authorization Type [employer] A[employer]    PT Start Time 1315    PT Stop Time 1400    PT Time Calculation (min) 45 min    Activity Tolerance Patient tolerated treatment well    Behavior During Therapy [facility] for tasks assessed/performed           Past Medical History:  Diagnosis Date  . Abnormal prostate biopsy   . Anticoagulant long-term use    currently xarelto  . BPH with elevated PSA   . CKD (chronic kidney disease), stage II   . Complication of anesthesia    limted neck rom limited use of left arm due to cva  . Coronary artery disease    CARDIOLOGIST-  DR Irish Lack--  2010-- PCI w/ stenting midLAD  . DDD (degenerative disc disease), lumbar   . Degeneration of cervical intervertebral disc   . Depression   . Dyspnea on exertion   . GERD (gastroesophageal reflux disease)   . Hemiparesis due to cerebral infarction   . History of cerebrovascular accident (CVA) with residual deficit 2002 and 2003--  hemiparisis both sides   per MRI  anterior left frontal lobe, left para midline pons, and inferior cerebullam bilaterally infarcts  . History of pulmonary embolus (PE)    06-30-2012  extensive bilaterally  . History of recurrent TIAs   . History of syncope    hx multiple pre-syncope and syncopal episodes due to vasovagal, orthostatic hypotension, dehydration  . History of TIAs    several since 2002  . Hyperlipidemia   . Hypertension   . Mild atherosclerosis of carotid artery, bilateral    per last duplex 11-04-2014  bilateral ICA 1--39%   . Neuropathy    fingers  . OSA on CPAP    followed by dr dohmeier--  sev. osa w/ AHI 65.9  . Prostate cancer ([place]) dx 2018  . Renal insufficiency   . S/P coronary artery stent placement 2010   stenting to mid LAD  . Simple renal cyst    bilaterally  . Stroke ([place])   . Trigger finger of both hands 11-17-13  . Type 2 diabetes mellitus ([place]) dx 1986   last one A1c 9.2 on 04-26-2016  . Unsteady gait    . Hx prior CVA/TIAs;  . Vertebral artery occlusion, left    chronic    Past Surgical History:  Procedure Laterality Date  . ANTERIOR CERVICAL DECOMP/DISCECTOMY FUSION  2004   C3 -- C6 limited rom  . CARDIAC CATHETERIZATION  06-10-2010   dr Irish Lack   wide patent LAD stent, mid lesion at the origin of the septal prior to the previous stent 40-50%/  normal LVF, ef 55%  . CARDIOVASCULAR STRESS TEST  10-23-2012  dr Irish Lack   normal nuclear perfusion study w/ no ischemia/  normal LV function and wall motion , ef 65%  . CARPAL TUNNEL RELEASE Bilateral   . CATARACT EXTRACTION W/ INTRAOCULAR LENS  IMPLANT, BILATERAL    . CHOLECYSTECTOMY N/A 11/02/2015   Procedure: LAPAROSCOPIC CHOLECYSTECTOMY WITH INTRAOPERATIVE  CHOLANGIOGRAM;  Surgeon: Donnie Mesa, MD;  Location: [facility];  Service: General;  Laterality: N/A;  . COLONOSCOPY    . CORONARY ANGIOPLASTY WITH STENT PLACEMENT  02/2008   stenting to mid LAD  . GOLD SEED IMPLANT N/A 11/15/2016   Procedure: GOLD SEED IMPLANT TIMES THREE;  Surgeon: Ardis Hughs, MD;  Location: [facility];  Service: Urology;  Laterality: N/A;  . IR ANGIO INTRA EXTRACRAN SEL COM CAROTID INNOMINATE BILAT MOD SED  06/13/2018  . IR ANGIO VERTEBRAL SEL VERTEBRAL UNI R MOD SED  06/13/2018  . IR US GUIDE VASC ACCESS RIGHT  06/13/2018  . LEFT HEART CATH AND CORONARY ANGIOGRAPHY N/A 05/25/2017   Procedure: LEFT HEART CATH AND CORONARY ANGIOGRAPHY;  Surgeon: Jettie Booze, MD;  Location: [facility];  Service: Cardiovascular;  Laterality: N/A;   . LEFT HEART CATHETERIZATION WITH CORONARY ANGIOGRAM N/A 04/03/2013   Procedure: LEFT HEART CATHETERIZATION WITH CORONARY ANGIOGRAM;  Surgeon: Jettie Booze, MD;  Location: [facility] CATH [facility];  Service: Cardiovascular;  Laterality: N/A;  patent mLAD stent  w/ mild disease in remainder LAD and its branches;  mod. focal lesion midLCFx- FFR of lesion was negative for ischemia/  normal LVSF, ef 50%  . lungs  2005   "fluid pumped off lungs"  . NEUROPLASTY / TRANSPOSITION ULNAR NERVE AT ELBOW Right 2004  . PROSTATE BIOPSY N/A 08/31/2016   Procedure: PROSTATE  BIOPSY TRANSRECTAL ULTRASONIC PROSTATE (TUBP);  Surgeon: Ardis Hughs, MD;  Location: [facility];  Service: Urology;  Laterality: N/A;  . SPACE OAR INSTILLATION N/A 11/15/2016   Procedure: SPACE OAR INSTILLATION;  Surgeon: Ardis Hughs, MD;  Location: [facility];  Service: Urology;  Laterality: N/A;  . TRANSTHORACIC ECHOCARDIOGRAM  04/27/2016   severe focal basal LVH, ef 60-65%,  grade 2 diastoilc dysfunction/  mild AR, MR, and TR/  atrial septum lipomatous hypertrophy/  PASP 62mmHg  . UMBILICAL HERNIA REPAIR      There were no vitals filed for this visit.   Subjective Assessment - 04/29/20 1324    Subjective No new c/o, reports compliance with HEP    Pertinent History CVA in 07/2017    Limitations Walking    How long can you sit comfortably? unimited    How long can you stand comfortably? <15 minutes    How long can you walk comfortably? <15 minutes    Currently in Pain? Yes    Pain Score 3     Pain Location Leg    Pain Orientation Left    Pain Onset More than a month ago    Aggravating Factors  WB activities              [facility] PT Assessment - 04/29/20 1327      Standardized Balance Assessment   Standardized Balance Assessment Dynamic Gait Index      Berg Balance Test   Sit to Stand Able to stand without using hands and stabilize independently    Standing Unsupported Able to stand  safely 2 minutes    Sitting with Back Unsupported but Feet Supported on Floor or Stool Able to sit safely and securely 2 minutes    Stand to Sit Controls descent by using hands    Transfers Able to transfer safely, minor use of hands    Standing Unsupported with Eyes Closed Able to stand 10 seconds safely    Standing Unsupported with Feet Together Able to place feet together independently and  stand 1 minute safely    From Standing, Reach Forward with Outstretched Arm Can reach confidently >25 cm (10")    From Standing Position, Pick up Object from [place] to pick up shoe safely and easily    From Standing Position, Turn to Look Behind Over each Shoulder Looks behind one side only/other side shows less weight shift    Turn 360 Degrees Able to turn 360 degrees safely but slowly    Standing Unsupported, Alternately Place Feet on Step/Stool Able to stand independently and safely and complete 8 steps in 20 seconds    Standing Unsupported, One Foot in Front Able to place foot tandem independently and hold 30 seconds    Standing on One Leg Tries to lift leg/unable to hold 3 seconds but remains standing independently    Total Score 49    Berg comment: 49/56      Functional Gait  Assessment   Gait assessed  Yes    Gait Level Surface Walks 20 ft, slow speed, abnormal gait pattern, evidence for imbalance or deviates 10-15 in outside of the 12 in walkway width. Requires more than 7 sec to ambulate 20 ft.    Change in Gait Speed Able to smoothly change walking speed without loss of balance or gait deviation. Deviate no more than 6 in outside of the 12 in walkway width.    Gait with Horizontal Head Turns Performs head turns smoothly with no change in gait. Deviates no more than 6 in outside 12 in walkway width    Gait with Vertical Head Turns Performs head turns with no change in gait. Deviates no more than 6 in outside 12 in walkway width.    Gait and Pivot Turn Pivot turns safely in greater than 3 sec  and stops with no loss of balance, or pivot turns safely within 3 sec and stops with mild imbalance, requires small steps to catch balance.    Step Over Obstacle Is able to step over one shoe box (4.5 in total height) but must slow down and adjust steps to clear box safely. May require verbal cueing.    Gait with Narrow Base of Support Ambulates 4-7 steps.    Gait with Eyes Closed Walks 20 ft, slow speed, abnormal gait pattern, evidence for imbalance, deviates 10-15 in outside 12 in walkway width. Requires more than 9 sec to ambulate 20 ft.    Ambulating Backwards Walks 20 ft, uses assistive device, slower speed, mild gait deviations, deviates 6-10 in outside 12 in walkway width.    Steps Alternating feet, must use rail.    Total Score 19    FGA comment: 19/30                                 PT Education - 04/29/20 1355    Education Details added clamshells and squats    Person(s) Educated Patient    Methods Explanation;Demonstration;Tactile cues;Verbal cues;Handout    Comprehension Verbalized understanding;Returned demonstration;Need further instruction            PT Short Term Goals - 04/29/20 1830      PT SHORT TERM GOAL #1   Title patien to demo HEP back to PT w/o need of VCs    Baseline issued HEP today    Time 4    Period Weeks    Status New    Target Date 05/25/20      PT SHORT  TERM GOAL #2   Title Assess BERG test and stablish goal    Baseline UTA due to time constraints; 04/29/20 BERG score 49/56, goal is 52    Time 4    Period Weeks    Status Achieved    Target Date 05/25/20      PT SHORT TERM GOAL #3   Title Assess FGA/DGI and establish goals    Baseline UTA due to time constraints; 04/29/20 FGA score 19/30, goal is 24    Time 4    Period Weeks    Status Achieved    Target Date 05/25/20      PT SHORT TERM GOAL #4   Title Patient to ambulate 567ft across level ground with LRAD under S    Baseline 83ft clinic distancs w/o need of AD     Time 4    Period Weeks    Status New    Target Date 05/25/20             PT Long Term Goals - 04/27/20 1352      PT LONG TERM GOAL #1   Title Improve TUG score from 14.9 secs to </= 13.5 secs without device to decr. fall risk.    Baseline 14.9s w/o AD    Time 8    Period Weeks    Status New    Target Date 06/22/20      PT LONG TERM GOAL #2   Title Assess progress towards BERG goal    Baseline not set    Time 8    Period Weeks    Status New    Target Date 06/22/20      PT LONG TERM GOAL #3   Title Assess progress towards FGA/DGI    Baseline not set    Time 8    Period Weeks    Status New    Target Date 06/22/20      PT LONG TERM GOAL #4   Title Patient to ambulat 1014ft across all surfaces with LRAD under S    Baseline 71ft across level ground w/o AD    Time 8    Period Weeks    Status New    Target Date 06/22/20                 Plan - 04/29/20 1330    Clinical Impression Statement Todays skilled session focused on contiuationof assessment of gait, balance and function including BERG, FGA and HEP review. HEP updated to address L hip weakness and balance deficits    Personal Factors and Comorbidities Comorbidity 2;Time since onset of injury/illness/exacerbation    Examination-Activity Limitations Locomotion Level    Stability/Clinical Decision Making Stable/Uncomplicated    Rehab Potential Good    PT Frequency 2x / week    PT Treatment/Interventions ADLs/Self Care Home Management;Aquatic Therapy;DME Instruction;Gait training;Stair training;Functional mobility training;Therapeutic activities;Therapeutic exercise;Balance training;Neuromuscular re-education;Patient/family education;Orthotic Fit/Training    PT Next Visit Plan continue stretching and strengthening of LLE for pain relief, assess benefit    PT Home Exercise Plan [email or web address]           Patient will benefit from skilled therapeutic intervention in order to improve the following deficits and  impairments:  Abnormal gait,Decreased range of motion,Difficulty walking,Decreased endurance,Decreased activity tolerance,Decreased balance,Decreased mobility,Decreased strength  Visit Diagnosis: Unsteadiness on feet  Muscle weakness (generalized)  Other abnormalities of gait and mobility     Problem List Patient Active Problem List   Diagnosis Date Noted  . Lumbar spinal stenosis  08/15/2019  . Nonspecific chest pain 08/11/2019  . Shortness of breath   . 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 ([place]) 10/21/2018  . Nocturnal hypoxemia 10/21/2018  . Radiation therapy complication 89/21/1941  . Primary prostate cancer ([place]) 07/31/2017  . Anemia 07/31/2017  . Vertigo due to cerebrovascular disease 07/31/2017  . Poor compliance with CPAP treatment 07/31/2017  . Absolute anemia 05/16/2017  . Avitaminosis D 05/16/2017  . Benign essential HTN 05/16/2017  . Benign prostatic hypertrophy without urinary obstruction 05/16/2017  . Clinical depression 05/16/2017  . CN (constipation) 05/16/2017  . Current drug use 05/16/2017  . Diabetic neuropathy ([place]) 05/16/2017  . Genital herpes 05/16/2017  . Infarction of lung due to iatrogenic pulmonary embolism ([place]) 05/16/2017  . Sciatica associated with disorder of lumbar spine 05/16/2017  . Arteriosclerosis of coronary artery 05/16/2017  . Artery disease, cerebral 05/16/2017  . Apnea, sleep 05/16/2017  . Arthralgia of hip or thigh 05/16/2017  . Malignant neoplasm of prostate ([place]) 09/28/2016  . Vascular dementia in remission ([place]) 09/28/2016  . Remote history of stroke 09/28/2016  . Acute kidney injury ([place]) 05/18/2016  . Dehydration 05/18/2016  . Near syncope 05/18/2016  . Orthostatic hypotension 05/18/2016  . CKD (chronic kidney disease), stage II 04/26/2016  . UTI (urinary tract infection) 04/26/2016  . Encounter for counseling on use of CPAP 11/18/2015  . Chronic cholecystitis with calculus 11/02/2015  . Stroke, vertebral  artery ([place]) 04/22/2015  . TIA (transient ischemic attack) 11/03/2014  . OSA on CPAP 05/18/2014  . White matter disease 08/14/2013  . OSA (obstructive sleep apnea) 08/14/2013  . Abnormal x-ray of temporomandibular joint 05/30/2013  . Chronic infection of sinus 05/30/2013  . Cough 05/30/2013  . Fatigue 05/30/2013  . Unsteady gait 05/19/2013  . Combined fat and carbohydrate induced hyperlipemia 04/24/2013  . Syncope 03/20/2013  . Angina pectoris ([place]) 10/08/2012  . Hypertension   . Stroke ([place])   . History of TIAs   . Lumbago   . Other and unspecified hyperlipidemia   . Personal history of unspecified circulatory disease   . Unspecified fall   . Pain in joint, multiple sites   . Degeneration of cervical intervertebral disc   . Unspecified cardiovascular disease   . History of pulmonary embolism: June 2014,  Takes Xarelto 07/01/2012    Class: History of  . Hemiparesis ([place]) 07/18/2011  . Diabetes mellitus type 2 with complications ([place]) 74/08/1446  . HTN (hypertension) 07/18/2011  . CAD in native artery 07/18/2011  . History of recurrent TIAs 07/18/2011    Lanice Shirts PT 04/29/2020, 6:32 PM  [facility] [address] [place], [place], [address] Phone: [phone]   Fax:  [phone]  Name: Alif Petrak MRN: [id number] Date of Birth: [birth year]

## 2020-04-29 NOTE — Patient Instructions (Signed)
Access Code: [email or web address] URL: [email or web address] Date: 04/29/2020 Prepared by: Sharlynn Oliphant  Exercises Supine Bridge - 2 x daily - 7 x weekly - 3 sets - 10 reps Seated Table Hamstring Stretch - 2 x daily - 7 x weekly - 1 sets - 3 reps - 30s hold Hooklying Single Knee to Chest Stretch - 2 x daily - 7 x weekly - 1 sets - 3 reps - 30s hold Clamshell - 2 x daily - 7 x weekly - 2 sets - 15 reps Squat - 2 x daily - 7 x weekly - 2 sets - 10 reps

## 2020-05-04 ENCOUNTER — Ambulatory Visit: Payer: Medicare Other

## 2020-05-04 ENCOUNTER — Other Ambulatory Visit: Payer: Self-pay

## 2020-05-04 DIAGNOSIS — M5432 Sciatica, left side: Secondary | ICD-10-CM | POA: Diagnosis not present

## 2020-05-04 DIAGNOSIS — M6281 Muscle weakness (generalized): Secondary | ICD-10-CM | POA: Diagnosis not present

## 2020-05-04 DIAGNOSIS — R2689 Other abnormalities of gait and mobility: Secondary | ICD-10-CM | POA: Diagnosis not present

## 2020-05-04 DIAGNOSIS — R2681 Unsteadiness on feet: Secondary | ICD-10-CM

## 2020-05-04 NOTE — Therapy (Signed)
[facility] [address] [place], [place], [address] Phone: [phone]   Fax:  [phone]  Physical Therapy Treatment  Patient Details  Name: Steve Brown MRN: [id number] Date of Birth: [birth year] Referring Provider (PT): Lona Kettle MD   Encounter Date: 05/04/2020   PT End of Session - 05/04/20 1157    Visit Number 3    Number of Visits 17    Date for PT Re-Evaluation 06/29/20    Authorization Type [employer] A[employer]    PT Start Time [id number]    PT Stop Time 1230    PT Time Calculation (min) 45 min    Equipment Utilized During Treatment Gait belt    Activity Tolerance Patient tolerated treatment well    Behavior During Therapy [facility] for tasks assessed/performed           Past Medical History:  Diagnosis Date  . Abnormal prostate biopsy   . Anticoagulant long-term use    currently xarelto  . BPH with elevated PSA   . CKD (chronic kidney disease), stage II   . Complication of anesthesia    limted neck rom limited use of left arm due to cva  . Coronary artery disease    CARDIOLOGIST-  DR Irish Lack--  2010-- PCI w/ stenting midLAD  . DDD (degenerative disc disease), lumbar   . Degeneration of cervical intervertebral disc   . Depression   . Dyspnea on exertion   . GERD (gastroesophageal reflux disease)   . Hemiparesis due to cerebral infarction   . History of cerebrovascular accident (CVA) with residual deficit 2002 and 2003--  hemiparisis both sides   per MRI  anterior left frontal lobe, left para midline pons, and inferior cerebullam bilaterally infarcts  . History of pulmonary embolus (PE)    06-30-2012  extensive bilaterally  . History of recurrent TIAs   . History of syncope    hx multiple pre-syncope and syncopal episodes due to vasovagal, orthostatic hypotension, dehydration  . History of TIAs    several since 2002  . Hyperlipidemia   . Hypertension   . Mild atherosclerosis of carotid artery, bilateral     per last duplex 11-04-2014  bilateral ICA 1--39%  . Neuropathy    fingers  . OSA on CPAP    followed by dr dohmeier--  sev. osa w/ AHI 65.9  . Prostate cancer ([place]) dx 2018  . Renal insufficiency   . S/P coronary artery stent placement 2010   stenting to mid LAD  . Simple renal cyst    bilaterally  . Stroke ([place])   . Trigger finger of both hands 11-17-13  . Type 2 diabetes mellitus ([place]) dx 1986   last one A1c 9.2 on 04-26-2016  . Unsteady gait    . Hx prior CVA/TIAs;  . Vertebral artery occlusion, left    chronic    Past Surgical History:  Procedure Laterality Date  . ANTERIOR CERVICAL DECOMP/DISCECTOMY FUSION  2004   C3 -- C6 limited rom  . CARDIAC CATHETERIZATION  06-10-2010   dr Irish Lack   wide patent LAD stent, mid lesion at the origin of the septal prior to the previous stent 40-50%/  normal LVF, ef 55%  . CARDIOVASCULAR STRESS TEST  10-23-2012  dr Irish Lack   normal nuclear perfusion study w/ no ischemia/  normal LV function and wall motion , ef 65%  . CARPAL TUNNEL RELEASE Bilateral   . CATARACT EXTRACTION W/ INTRAOCULAR LENS  IMPLANT, BILATERAL    . CHOLECYSTECTOMY  N/A 11/02/2015   Procedure: LAPAROSCOPIC CHOLECYSTECTOMY WITH INTRAOPERATIVE CHOLANGIOGRAM;  Surgeon: Donnie Mesa, MD;  Location: [facility];  Service: General;  Laterality: N/A;  . COLONOSCOPY    . CORONARY ANGIOPLASTY WITH STENT PLACEMENT  02/2008   stenting to mid LAD  . GOLD SEED IMPLANT N/A 11/15/2016   Procedure: GOLD SEED IMPLANT TIMES THREE;  Surgeon: Ardis Hughs, MD;  Location: [facility];  Service: Urology;  Laterality: N/A;  . IR ANGIO INTRA EXTRACRAN SEL COM CAROTID INNOMINATE BILAT MOD SED  06/13/2018  . IR ANGIO VERTEBRAL SEL VERTEBRAL UNI R MOD SED  06/13/2018  . IR US GUIDE VASC ACCESS RIGHT  06/13/2018  . LEFT HEART CATH AND CORONARY ANGIOGRAPHY N/A 05/25/2017   Procedure: LEFT HEART CATH AND CORONARY ANGIOGRAPHY;  Surgeon: Jettie Booze, MD;  Location: [facility];  Service: Cardiovascular;  Laterality: N/A;  . LEFT HEART CATHETERIZATION WITH CORONARY ANGIOGRAM N/A 04/03/2013   Procedure: LEFT HEART CATHETERIZATION WITH CORONARY ANGIOGRAM;  Surgeon: Jettie Booze, MD;  Location: [facility] CATH LAB;  Service: Cardiovascular;  Laterality: N/A;  patent mLAD stent  w/ mild disease in remainder LAD and its branches;  mod. focal lesion midLCFx- FFR of lesion was negative for ischemia/  normal LVSF, ef 50%  . lungs  2005   "fluid pumped off lungs"  . NEUROPLASTY / TRANSPOSITION ULNAR NERVE AT ELBOW Right 2004  . PROSTATE BIOPSY N/A 08/31/2016   Procedure: PROSTATE  BIOPSY TRANSRECTAL ULTRASONIC PROSTATE (TUBP);  Surgeon: Ardis Hughs, MD;  Location: [facility];  Service: Urology;  Laterality: N/A;  . SPACE OAR INSTILLATION N/A 11/15/2016   Procedure: SPACE OAR INSTILLATION;  Surgeon: Ardis Hughs, MD;  Location[facility];  Service: Urology;  Laterality: N/A;  . TRANSTHORACIC ECHOCARDIOGRAM  04/27/2016   severe focal basal LVH, ef 60-65%,  grade 2 diastoilc dysfunction/  mild AR, MR, and TR/  atrial septum lipomatous hypertrophy/  PASP 84mmHg  . UMBILICAL HERNIA REPAIR      There were no vitals filed for this visit.   Subjective Assessment - 05/04/20 1155    Subjective Reports L thigh pain following last session, point tender to quad muscles, relieved with ice, denies true sciatic symptoms this date    Pertinent History CVA in 07/2017    Limitations Walking    How long can you sit comfortably? unimited    How long can you stand comfortably? <15 minutes    How long can you walk comfortably? <15 minutes    Pain Onset More than a month ago                             [facility] Adult PT Treatment/Exercise - 05/04/20 0001      Ambulation/Gait   Ambulation/Gait Yes    Ambulation/Gait Assistance 5: Supervision    Ambulation/Gait Assistance Details slow cadence with decreased L hip extension     Ambulation Distance (Feet) 115 Feet    Assistive device None    Gait Pattern Antalgic;Decreased trunk rotation;Wide base of support      Lumbar Exercises: Supine   Heel Slides 10 reps;Limitations    Heel Slides Limitations over towel    Bridge Non-compliant;10 reps    Bridge Limitations 2x10    Other Supine Lumbar Exercises supine marching, alt. 2x10      Knee/Hip Exercises: Aerobic   Nustep L2 8' arms 10  Knee/Hip Exercises: Standing   Other Standing Knee Exercises added standing L hip flexor stretch using chair w/o arms, 30s hold x3      Manual Therapy   Manual Therapy Soft tissue mobilization    Soft tissue mobilization soft tissue and trigger point relief to L quadricep group                    PT Short Term Goals - 04/29/20 1830      PT SHORT TERM GOAL #1   Title patien to demo HEP back to PT w/o need of VCs    Baseline issued HEP today    Time 4    Period Weeks    Status New    Target Date 05/25/20      PT SHORT TERM GOAL #2   Title Assess BERG test and stablish goal    Baseline UTA due to time constraints; 04/29/20 BERG score 49/56, goal is 52    Time 4    Period Weeks    Status Achieved    Target Date 05/25/20      PT SHORT TERM GOAL #3   Title Assess FGA/DGI and establish goals    Baseline UTA due to time constraints; 04/29/20 FGA score 19/30, goal is 24    Time 4    Period Weeks    Status Achieved    Target Date 05/25/20      PT SHORT TERM GOAL #4   Title Patient to ambulate 563ft across level ground with LRAD under S    Baseline 80ft clinic distancs w/o need of AD    Time 4    Period Weeks    Status New    Target Date 05/25/20             PT Long Term Goals - 04/27/20 1352      PT LONG TERM GOAL #1   Title Improve TUG score from 14.9 secs to </= 13.5 secs without device to decr. fall risk.    Baseline 14.9s w/o AD    Time 8    Period Weeks    Status New    Target Date 06/22/20      PT LONG TERM GOAL #2   Title Assess  progress towards BERG goal    Baseline not set    Time 8    Period Weeks    Status New    Target Date 06/22/20      PT LONG TERM GOAL #3   Title Assess progress towards FGA/DGI    Baseline not set    Time 8    Period Weeks    Status New    Target Date 06/22/20      PT LONG TERM GOAL #4   Title Patient to ambulat 1058ft across all surfaces with LRAD under S    Baseline 53ft across level ground w/o AD    Time 8    Period Weeks    Status New    Target Date 06/22/20                 Plan - 05/04/20 1357    Clinical Impression Statement Todays skilled session focused on BLE strength and stretching activities, patient reporting pain with stretching of L hip flexors and demonstrating soft tissue restrictions and unable to lie supine with LLE extended comfortably due to L lumbosacral pain, attempted soft tissue mobilizations to L quads with limited relief of symptoms, lumbosacral dysfunction suspected  Personal Factors and Comorbidities Comorbidity 2;Time since onset of injury/illness/exacerbation    Examination-Activity Limitations Locomotion Level    Stability/Clinical Decision Making Stable/Uncomplicated    Rehab Potential Good    PT Frequency 2x / week    PT Treatment/Interventions ADLs/Self Care Home Management;Aquatic Therapy;DME Instruction;Gait training;Stair training;Functional mobility training;Therapeutic activities;Therapeutic exercise;Balance training;Neuromuscular re-education;Patient/family education;Orthotic Fit/Training    PT Next Visit Plan continue stretching and strengthening of LLE, consider moist heat and assess SI alignment    PT Home Exercise Plan [id number]           Patient will benefit from skilled therapeutic intervention in order to improve the following deficits and impairments:  Abnormal gait,Decreased range of motion,Difficulty walking,Decreased endurance,Decreased activity tolerance,Decreased balance,Decreased mobility,Decreased strength  Visit  Diagnosis: Unsteadiness on feet  Muscle weakness (generalized)  Other abnormalities of gait and mobility     Problem List Patient Active Problem List   Diagnosis Date Noted  . Lumbar spinal stenosis 08/15/2019  . Nonspecific chest pain 08/11/2019  . Shortness of breath   . 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 ([place]) 10/21/2018  . Nocturnal hypoxemia 10/21/2018  . Radiation therapy complication 16/10/9602  . Primary prostate cancer ([place]) 07/31/2017  . Anemia 07/31/2017  . Vertigo due to cerebrovascular disease 07/31/2017  . Poor compliance with CPAP treatment 07/31/2017  . Absolute anemia 05/16/2017  . Avitaminosis D 05/16/2017  . Benign essential HTN 05/16/2017  . Benign prostatic hypertrophy without urinary obstruction 05/16/2017  . Clinical depression 05/16/2017  . CN (constipation) 05/16/2017  . Current drug use 05/16/2017  . Diabetic neuropathy ([place]) 05/16/2017  . Genital herpes 05/16/2017  . Infarction of lung due to iatrogenic pulmonary embolism ([place]) 05/16/2017  . Sciatica associated with disorder of lumbar spine 05/16/2017  . Arteriosclerosis of coronary artery 05/16/2017  . Artery disease, cerebral 05/16/2017  . Apnea, sleep 05/16/2017  . Arthralgia of hip or thigh 05/16/2017  . Malignant neoplasm of prostate ([place]) 09/28/2016  . Vascular dementia in remission ([place]) 09/28/2016  . Remote history of stroke 09/28/2016  . Acute kidney injury ([place]) 05/18/2016  . Dehydration 05/18/2016  . Near syncope 05/18/2016  . Orthostatic hypotension 05/18/2016  . CKD (chronic kidney disease), stage II 04/26/2016  . UTI (urinary tract infection) 04/26/2016  . Encounter for counseling on use of CPAP 11/18/2015  . Chronic cholecystitis with calculus 11/02/2015  . Stroke, 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[place]) 04/22/2015  . TIA (transient ischemic attack) 11/03/2014  . OSA on CPAP 05/18/2014  . White matter disease 08/14/2013  . OSA (obstructive sleep apnea) 08/14/2013  .  Abnormal x-ray of temporomandibular joint 05/30/2013  . Chronic infection of sinus 05/30/2013  . Cough 05/30/2013  . Fatigue 05/30/2013  . Unsteady gait 05/19/2013  . Combined fat and carbohydrate induced hyperlipemia 04/24/2013  . Syncope 03/20/2013  . Angina pectoris ([place]) 10/08/2012  . Hypertension   . Stroke ([place])   . History of TIAs   . Lumbago   . Other and unspecified hyperlipidemia   . Personal history of unspecified circulatory disease   . Unspecified fall   . Pain in joint, multiple sites   . Degeneration of cervical intervertebral disc   . Unspecified cardiovascular disease   . History of pulmonary embolism: June 2014,  Takes Xarelto 07/01/2012    Class: History of  . Hemiparesis ([place]) 07/18/2011  . Diabetes mellitus type 2 with complications ([place]) 54/09/8117  . HTN (hypertension) 07/18/2011  . CAD in native artery 07/18/2011  . History of recurrent TIAs 07/18/2011    Dellis Filbert  Karis Juba 05/04/2020, 2:44 PM  [facility] [address] [place], [place], [address] Phone: [phone]   Fax:  [phone]  Name: Barbara Calcano MRN: [id number] Date of Birth: [birth year]

## 2020-05-06 ENCOUNTER — Ambulatory Visit: Payer: Medicare Other

## 2020-05-06 DIAGNOSIS — I639 Cerebral infarction, unspecified: Secondary | ICD-10-CM | POA: Diagnosis not present

## 2020-05-06 DIAGNOSIS — E7800 Pure hypercholesterolemia, unspecified: Secondary | ICD-10-CM | POA: Diagnosis not present

## 2020-05-06 DIAGNOSIS — E1165 Type 2 diabetes mellitus with hyperglycemia: Secondary | ICD-10-CM | POA: Diagnosis not present

## 2020-05-06 DIAGNOSIS — E669 Obesity, unspecified: Secondary | ICD-10-CM | POA: Diagnosis not present

## 2020-05-06 DIAGNOSIS — I10 Essential (primary) hypertension: Secondary | ICD-10-CM | POA: Diagnosis not present

## 2020-05-06 DIAGNOSIS — G609 Hereditary and idiopathic neuropathy, unspecified: Secondary | ICD-10-CM | POA: Diagnosis not present

## 2020-05-06 DIAGNOSIS — N189 Chronic kidney disease, unspecified: Secondary | ICD-10-CM | POA: Diagnosis not present

## 2020-05-06 DIAGNOSIS — C61 Malignant neoplasm of prostate: Secondary | ICD-10-CM | POA: Diagnosis not present

## 2020-05-07 ENCOUNTER — Other Ambulatory Visit: Payer: Self-pay

## 2020-05-07 ENCOUNTER — Ambulatory Visit: Payer: Medicare Other

## 2020-05-07 DIAGNOSIS — M6281 Muscle weakness (generalized): Secondary | ICD-10-CM

## 2020-05-07 DIAGNOSIS — R2689 Other abnormalities of gait and mobility: Secondary | ICD-10-CM | POA: Diagnosis not present

## 2020-05-07 DIAGNOSIS — R2681 Unsteadiness on feet: Secondary | ICD-10-CM | POA: Diagnosis not present

## 2020-05-07 DIAGNOSIS — M5432 Sciatica, left side: Secondary | ICD-10-CM | POA: Diagnosis not present

## 2020-05-07 NOTE — Therapy (Signed)
[facility] [address] [place], [place], [address] Phone: [phone]   Fax:  [phone]  Physical Therapy Treatment  Patient Details  Name: Steve Brown MRN: [id number] Date of Birth: [birth year] Referring Provider (PT): Lona Kettle MD   Encounter Date: 05/07/2020   PT End of Session - 05/07/20 0941    Visit Number 4    Number of Visits 17    Date for PT Re-Evaluation 06/29/20    Authorization Type [employer] A[employer]    PT Start Time 0930    PT Stop Time 1015    PT Time Calculation (min) 45 min    Equipment Utilized During Treatment Gait belt    Activity Tolerance Patient tolerated treatment well    Behavior During Therapy [facility] for tasks assessed/performed           Past Medical History:  Diagnosis Date  . Abnormal prostate biopsy   . Anticoagulant long-term use    currently xarelto  . BPH with elevated PSA   . CKD (chronic kidney disease), stage II   . Complication of anesthesia    limted neck rom limited use of left arm due to cva  . Coronary artery disease    CARDIOLOGIST-  DR Irish Lack--  2010-- PCI w/ stenting midLAD  . DDD (degenerative disc disease), lumbar   . Degeneration of cervical intervertebral disc   . Depression   . Dyspnea on exertion   . GERD (gastroesophageal reflux disease)   . Hemiparesis due to cerebral infarction   . History of cerebrovascular accident (CVA) with residual deficit 2002 and 2003--  hemiparisis both sides   per MRI  anterior left frontal lobe, left para midline pons, and inferior cerebullam bilaterally infarcts  . History of pulmonary embolus (PE)    06-30-2012  extensive bilaterally  . History of recurrent TIAs   . History of syncope    hx multiple pre-syncope and syncopal episodes due to vasovagal, orthostatic hypotension, dehydration  . History of TIAs    several since 2002  . Hyperlipidemia   . Hypertension   . Mild atherosclerosis of carotid artery, bilateral     per last duplex 11-04-2014  bilateral ICA 1--39%  . Neuropathy    fingers  . OSA on CPAP    followed by dr dohmeier--  sev. osa w/ AHI 65.9  . Prostate cancer ([place]) dx 2018  . Renal insufficiency   . S/P coronary artery stent placement 2010   stenting to mid LAD  . Simple renal cyst    bilaterally  . Stroke ([place])   . Trigger finger of both hands 11-17-13  . Type 2 diabetes mellitus ([place]) dx 1986   last one A1c 9.2 on 04-26-2016  . Unsteady gait    . Hx prior CVA/TIAs;  . Vertebral artery occlusion, left    chronic    Past Surgical History:  Procedure Laterality Date  . ANTERIOR CERVICAL DECOMP/DISCECTOMY FUSION  2004   C3 -- C6 limited rom  . CARDIAC CATHETERIZATION  06-10-2010   dr Irish Lack   wide patent LAD stent, mid lesion at the origin of the septal prior to the previous stent 40-50%/  normal LVF, ef 55%  . CARDIOVASCULAR STRESS TEST  10-23-2012  dr Irish Lack   normal nuclear perfusion study w/ no ischemia/  normal LV function and wall motion , ef 65%  . CARPAL TUNNEL RELEASE Bilateral   . CATARACT EXTRACTION W/ INTRAOCULAR LENS  IMPLANT, BILATERAL    . CHOLECYSTECTOMY  N/A 11/02/2015   Procedure: LAPAROSCOPIC CHOLECYSTECTOMY WITH INTRAOPERATIVE CHOLANGIOGRAM;  Surgeon: Donnie Mesa, MD;  Location: [facility];  Service: General;  Laterality: N/A;  . COLONOSCOPY    . CORONARY ANGIOPLASTY WITH STENT PLACEMENT  02/2008   stenting to mid LAD  . GOLD SEED IMPLANT N/A 11/15/2016   Procedure: GOLD SEED IMPLANT TIMES THREE;  Surgeon: Ardis Hughs, MD;  Location: [facility];  Service: Urology;  Laterality: N/A;  . IR ANGIO INTRA EXTRACRAN SEL COM CAROTID INNOMINATE BILAT MOD SED  06/13/2018  . IR ANGIO VERTEBRAL SEL VERTEBRAL UNI R MOD SED  06/13/2018  . IR US GUIDE VASC ACCESS RIGHT  06/13/2018  . LEFT HEART CATH AND CORONARY ANGIOGRAPHY N/A 05/25/2017   Procedure: LEFT HEART CATH AND CORONARY ANGIOGRAPHY;  Surgeon: Jettie Booze, MD;  Location: [facility];  Service: Cardiovascular;  Laterality: N/A;  . LEFT HEART CATHETERIZATION WITH CORONARY ANGIOGRAM N/A 04/03/2013   Procedure: LEFT HEART CATHETERIZATION WITH CORONARY ANGIOGRAM;  Surgeon: Jettie Booze, MD;  Location: [facility] C[facility];  Service: Cardiovascular;  Laterality: N/A;  patent mLAD stent  w/ mild disease in remainder LAD and its branches;  mod. focal lesion midLCFx- FFR of lesion was negative for ischemia/  normal LVSF, ef 50%  . lungs  2005   "fluid pumped off lungs"  . NEUROPLASTY / TRANSPOSITION ULNAR NERVE AT ELBOW Right 2004  . PROSTATE BIOPSY N/A 08/31/2016   Procedure: PROSTATE  BIOPSY TRANSRECTAL ULTRASONIC PROSTATE (TUBP);  Surgeon: Ardis Hughs, MD;  Location[facility];  Service: Urology;  Laterality: N/A;  . SPACE OAR INSTILLATION N/A 11/15/2016   Procedure: SPACE OAR INSTILLATION;  Surgeon: Ardis Hughs, MD;  Location: [facility];  Service: Urology;  Laterality: N/A;  . TRANSTHORACIC ECHOCARDIOGRAM  04/27/2016   severe focal basal LVH, ef 60-65%,  grade 2 diastoilc dysfunction/  mild AR, MR, and TR/  atrial septum lipomatous hypertrophy/  PASP 44mHg  . UMBILICAL HERNIA REPAIR      There were no vitals filed for this visit.   Subjective Assessment - 05/07/20 0938    Subjective No change in L hip/low back pain, requests heat prior to stretch    Pertinent History CVA in 07/2017    Limitations Walking    How long can you sit comfortably? unimited    How long can you stand comfortably? <15 minutes    How long can you walk comfortably? <15 minutes    Pain Type Chronic pain    Pain Onset More than a month ago                             [facility] - 05/07/20 0001      Lumbar Exercises: Supine   Bridge Non-compliant;10 reps    Bridge Limitations 2x10    Other Supine Lumbar Exercises supine marching, alt. 2x10    Other Supine Lumbar Exercises bent knee fallouts, 2x10       Knee/Hip Exercises: Aerobic   Nustep L2 8' arms 10      Modalities   Modalities Moist Heat      Moist Heat Therapy   Number Minutes Moist Heat 10 Minutes    Moist Heat Location Lumbar Spine;Hip;Other (comment)      Manual Therapy   Manual Therapy Joint mobilization;Muscle Energy Technique    Manual therapy comments performed MET to correct a posterior pelvic  rotation, 3 bouts of isometrics, 5s hold                  PT Education - 05/07/20 1209    Education Details discused need to return to [facility] for walking prgram as well as need to stertch daily    Person(s) Educated Patient    Methods Explanation;Demonstration    Comprehension Verbalized understanding;Returned demonstration;Need further instruction            PT Short Term Goals - 04/29/20 1830      PT SHORT TERM GOAL #1   Title patien to demo HEP back to PT w/o need of VCs    Baseline issued HEP today    Time 4    Period Weeks    Status New    Target Date 05/25/20      PT SHORT TERM GOAL #2   Title Assess BERG test and stablish goal    Baseline UTA due to time constraints; 04/29/20 BERG score 49/56, goal is 52    Time 4    Period Weeks    Status Achieved    Target Date 05/25/20      PT SHORT TERM GOAL #3   Title Assess FGA/DGI and establish goals    Baseline UTA due to time constraints; 04/29/20 FGA score 19/30, goal is 24    Time 4    Period Weeks    Status Achieved    Target Date 05/25/20      PT SHORT TERM GOAL #4   Title Patient to ambulate 569f across level ground with LRAD under S    Baseline 537fclinic distancs w/o need of AD    Time 4    Period Weeks    Status New    Target Date 05/25/20             PT Long Term Goals - 04/27/20 1352      PT LONG TERM GOAL #1   Title Improve TUG score from 14.9 secs to </= 13.5 secs without device to decr. fall risk.    Baseline 14.9s w/o AD    Time 8    Period Weeks    Status New    Target Date 06/22/20      PT LONG TERM GOAL #2   Title  Assess progress towards BERG goal    Baseline not set    Time 8    Period Weeks    Status New    Target Date 06/22/20      PT LONG TERM GOAL #3   Title Assess progress towards FGA/DGI    Baseline not set    Time 8    Period Weeks    Status New    Target Date 06/22/20      PT LONG TERM GOAL #4   Title Patient to ambulat [id number] all surfaces with LRAD under S    Baseline 69f57fross level ground w/o AD    Time 8    Period Weeks    Status New    Target Date 06/22/20                 Plan - 05/07/20 1010    Clinical Impression Statement todays session started with moist heat at patientrequest due to tightness and pain, applied to anterior L hip and low back,  Assess pelvic alignment ant found to have a suspected posterior rotation of L ilium, attempted MET to correct, assessment difficult due to body habitus  and limitations identifying bony landmarks. Pellvic alignment demos posterior rotation of L ilium    Personal Factors and Comorbidities Comorbidity 2;Time since onset of injury/illness/exacerbation    Examination-Activity Limitations Locomotion Level    Stability/Clinical Decision Making Stable/Uncomplicated    Rehab Potential Good    PT Frequency 2x / week    PT Treatment/Interventions ADLs/Self Care Home Management;Aquatic Therapy;DME Instruction;Gait training;Stair training;Functional mobility training;Therapeutic activities;Therapeutic exercise;Balance training;Neuromuscular re-education;Patient/family education;Orthotic Fit/Training    PT Next Visit Plan reassess pelvic alignment , review HEP, check benefit of MET    PT Home Exercise Plan [email or web address]           Patient will benefit from skilled therapeutic intervention in order to improve the following deficits and impairments:  Abnormal gait,Decreased range of motion,Difficulty walking,Decreased endurance,Decreased activity tolerance,Decreased balance,Decreased mobility,Decreased strength  Visit  Diagnosis: Unsteadiness on feet  Muscle weakness (generalized)  Other abnormalities of gait and mobility  Sciatica, left side     Problem List Patient Active Problem List   Diagnosis Date Noted  . Lumbar spinal stenosis 08/15/2019  . Nonspecific chest pain 08/11/2019  . Shortness of breath   . 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 ([place]) 10/21/2018  . Nocturnal hypoxemia 10/21/2018  . Radiation therapy complication 51/70/0174  . Primary prostate cancer ([place]) 07/31/2017  . Anemia 07/31/2017  . Vertigo due to cerebrovascular disease 07/31/2017  . Poor compliance with CPAP treatment 07/31/2017  . Absolute anemia 05/16/2017  . Avitaminosis D 05/16/2017  . Benign essential HTN 05/16/2017  . Benign prostatic hypertrophy without urinary obstruction 05/16/2017  . Clinical depression 05/16/2017  . CN (constipation) 05/16/2017  . Current drug use 05/16/2017  . Diabetic neuropathy ([place]) 05/16/2017  . Genital herpes 05/16/2017  . Infarction of lung due to iatrogenic pulmonary embolism ([place]) 05/16/2017  . Sciatica associated with disorder of lumbar spine 05/16/2017  . Arteriosclerosis of coronary artery 05/16/2017  . Artery disease, cerebral 05/16/2017  . Apnea, sleep 05/16/2017  . Arthralgia of hip or thigh 05/16/2017  . Malignant neoplasm of prostate ([place]) 09/28/2016  . Vascular dementia in remission ([place]) 09/28/2016  . Remote history of stroke 09/28/2016  . Acute kidney injury ([place]) 05/18/2016  . Dehydration 05/18/2016  . Near syncope 05/18/2016  . Orthostatic hypotension 05/18/2016  . CKD (chronic kidney disease), stage II 04/26/2016  . UTI (urinary tract infection) 04/26/2016  . Encounter for counseling on use of CPAP 11/18/2015  . Chronic cholecystitis with calculus 11/02/2015  . 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 TIA (transient ischemic attack) 11/03/2014  . OSA on CPAP 05/18/2014  . White matter disease 08/14/2013  . OSA (obstructive sleep  apnea) 08/14/2013  . Abnormal x-ray of temporomandibular joint 05/30/2013  . Chronic infection of sinus 05/30/2013  . Cough 05/30/2013  . Fatigue 05/30/2013  . Unsteady gait 05/19/2013  . Combined fat and carbohydrate induced hyperlipemia 04/24/2013  . Syncope 03/20/2013  . Angina pectoris ([place]) 10/08/2012  . Hypertension   . Stroke ([place])   . History of TIAs   . Lumbago   . Other and unspecified hyperlipidemia   . Personal history of unspecified circulatory disease   . Unspecified fall   . Pain in joint, multiple sites   . Degeneration of cervical intervertebral disc   . Unspecified cardiovascular disease   . History of pulmonary embolism: June 2014,  Takes Xarelto 07/01/2012    Class: History of  . Hemiparesis ([place]) 07/18/2011  . Diabetes mellitus type 2 with complications ([place]) 94/49/6759  . HTN (hypertension) 07/18/2011  .  CAD in native artery 07/18/2011  . History of recurrent TIAs 07/18/2011    Lanice Shirts PT 05/07/2020, 12:17 PM  [facility] [address] [place], [place], [address] Phone: [phone]   Fax:  [phone]  Name: Tashon Capp MRN: [id number] Date of Birth: Nov 14, 1940

## 2020-05-11 ENCOUNTER — Other Ambulatory Visit: Payer: Self-pay

## 2020-05-11 ENCOUNTER — Ambulatory Visit: Payer: Medicare Other | Attending: Family Medicine

## 2020-05-11 DIAGNOSIS — M6281 Muscle weakness (generalized): Secondary | ICD-10-CM | POA: Diagnosis not present

## 2020-05-11 DIAGNOSIS — R2681 Unsteadiness on feet: Secondary | ICD-10-CM | POA: Diagnosis not present

## 2020-05-11 DIAGNOSIS — M5432 Sciatica, left side: Secondary | ICD-10-CM

## 2020-05-11 DIAGNOSIS — R2689 Other abnormalities of gait and mobility: Secondary | ICD-10-CM | POA: Diagnosis not present

## 2020-05-11 NOTE — Therapy (Signed)
[facility] [address] [place], [place], [address] Phone: [phone]   Fax:  [phone]  Physical Therapy Treatment  Patient Details  Name: Steve Brown MRN: [id number] Date of Birth: Mar 20, 1940 Referring Provider (PT): Lona Kettle MD   Encounter Date: 05/11/2020   PT End of Session - 05/11/20 1131    Visit Number 5    Number of Visits 17    Date for PT Re-Evaluation 06/29/20    Authorization Type [employer] A[employer]    PT Start Time 1115    PT Stop Time 1215    PT Time Calculation (min) 60 min    Equipment Utilized During Treatment Gait belt    Activity Tolerance Patient tolerated treatment well    Behavior During Therapy [facility] for tasks assessed/performed           Past Medical History:  Diagnosis Date  . Abnormal prostate biopsy   . Anticoagulant long-term use    currently xarelto  . BPH with elevated PSA   . CKD (chronic kidney disease), stage II   . Complication of anesthesia    limted neck rom limited use of left arm due to cva  . Coronary artery disease    CARDIOLOGIST-  DR Irish Lack--  2010-- PCI w/ stenting midLAD  . DDD (degenerative disc disease), lumbar   . Degeneration of cervical intervertebral disc   . Depression   . Dyspnea on exertion   . GERD (gastroesophageal reflux disease)   . Hemiparesis due to cerebral infarction   . History of cerebrovascular accident (CVA) with residual deficit 2002 and 2003--  hemiparisis both sides   per MRI  anterior left frontal lobe, left para midline pons, and inferior cerebullam bilaterally infarcts  . History of pulmonary embolus (PE)    06-30-2012  extensive bilaterally  . History of recurrent TIAs   . History of syncope    hx multiple pre-syncope and syncopal episodes due to vasovagal, orthostatic hypotension, dehydration  . History of TIAs    several since 2002  . Hyperlipidemia   . Hypertension   . Mild atherosclerosis of carotid artery, bilateral     per last duplex 11-04-2014  bilateral ICA 1--39%  . Neuropathy    fingers  . OSA on CPAP    followed by dr dohmeier--  sev. osa w/ AHI 65.9  . Prostate cancer ([place]) dx 2018  . Renal insufficiency   . S/P coronary artery stent placement 2010   stenting to mid LAD  . Simple renal cyst    bilaterally  . Stroke ([place])   . Trigger finger of both hands 11-17-13  . Type 2 diabetes mellitus ([place]) dx 1986   last one A1c 9.2 on 04-26-2016  . Unsteady gait    . Hx prior CVA/TIAs;  . Vertebral artery occlusion, left    chronic    Past Surgical History:  Procedure Laterality Date  . ANTERIOR CERVICAL DECOMP/DISCECTOMY FUSION  2004   C3 -- C6 limited rom  . CARDIAC CATHETERIZATION  06-10-2010   dr Irish Lack   wide patent LAD stent, mid lesion at the origin of the septal prior to the previous stent 40-50%/  normal LVF, ef 55%  . CARDIOVASCULAR STRESS TEST  10-23-2012  dr Irish Lack   normal nuclear perfusion study w/ no ischemia/  normal LV function and wall motion , ef 65%  . CARPAL TUNNEL RELEASE Bilateral   . CATARACT EXTRACTION W/ INTRAOCULAR LENS  IMPLANT, BILATERAL    . CHOLECYSTECTOMY  N/A 11/02/2015   Procedure: LAPAROSCOPIC CHOLECYSTECTOMY WITH INTRAOPERATIVE CHOLANGIOGRAM;  Surgeon: Donnie Mesa, MD;  Location: [facility];  Service: General;  Laterality: N/A;  . COLONOSCOPY    . CORONARY ANGIOPLASTY WITH STENT PLACEMENT  02/2008   stenting to mid LAD  . GOLD SEED IMPLANT N/A 11/15/2016   Procedure: GOLD SEED IMPLANT TIMES THREE;  Surgeon: Ardis Hughs, MD;  Location[facility];  Service: Urology;  Laterality: N/A;  . IR ANGIO INTRA EXTRACRAN SEL COM CAROTID INNOMINATE BILAT MOD SED  06/13/2018  . IR ANGIO VERTEBRAL SEL VERTEBRAL UNI R MOD SED  06/13/2018  . IR US GUIDE VASC ACCESS RIGHT  06/13/2018  . LEFT HEART CATH AND CORONARY ANGIOGRAPHY N/A 05/25/2017   Procedure: LEFT HEART CATH AND CORONARY ANGIOGRAPHY;  Surgeon: Jettie Booze, MD;  Location: [facility]  [facility];  Service: Cardiovascular;  Laterality: N/A;  . LEFT HEART CATHETERIZATION WITH CORONARY ANGIOGRAM N/A 04/03/2013   Procedure: LEFT HEART CATHETERIZATION WITH CORONARY ANGIOGRAM;  Surgeon: Jettie Booze, MD;  Location: [facility] CATH LAB;  Service: Cardiovascular;  Laterality: N/A;  patent mLAD stent  w/ mild disease in remainder LAD and its branches;  mod. focal lesion midLCFx- FFR of lesion was negative for ischemia/  normal LVSF, ef 50%  . lungs  2005   "fluid pumped off lungs"  . NEUROPLASTY / TRANSPOSITION ULNAR NERVE AT ELBOW Right 2004  . PROSTATE BIOPSY N/A 08/31/2016   Procedure: PROSTATE  BIOPSY TRANSRECTAL ULTRASONIC PROSTATE (TUBP);  Surgeon: Ardis Hughs, MD;  Location: [facility];  Service: Urology;  Laterality: N/A;  . SPACE OAR INSTILLATION N/A 11/15/2016   Procedure: SPACE OAR INSTILLATION;  Surgeon: Ardis Hughs, MD;  Location: [facility];  Service: Urology;  Laterality: N/A;  . TRANSTHORACIC ECHOCARDIOGRAM  04/27/2016   severe focal basal LVH, ef 60-65%,  grade 2 diastoilc dysfunction/  mild AR, MR, and TR/  atrial septum lipomatous hypertrophy/  PASP 33mHg  . UMBILICAL HERNIA REPAIR      There were no vitals filed for this visit.   Subjective Assessment - 05/11/20 1128    Subjective Reports less pain overall, was able to go w/o need of tramadol last night    Pertinent History CVA in 07/2017    Limitations Walking    How long can you sit comfortably? unimited    How long can you stand comfortably? <15 minutes    How long can you walk comfortably? <15 minutes    Currently in Pain? Yes    Pain Score 4     Pain Location Hip    Pain Type Chronic pain    Pain Onset More than a month ago                             [facility] Services[facility]ult PT Treatment/Exercise - 05/11/20 0001      Lumbar Exercises: Seated   Other Seated Lumbar Exercises core exercises of chest press, OH flexion and chops, 2x10 with 1.1# ball       Lumbar Exercises: Supine   Bridge with Ball Squeeze Non-compliant;10 reps;Limitations    Bridge with BCardinal HealthLimitations 3x10    Other Supine Lumbar Exercises supine march 3x10      Lumbar Exercises: Sidelying   Clam Both;10 reps;Limitations    Clam Limitations 2x10 ea. LE      Knee/Hip Exercises: Supine   Other Supine Knee/Hip Exercises DKTC  over red ball 2x10      Modalities   Modalities Moist Heat      Moist Heat Therapy   Number Minutes Moist Heat 10 Minutes    Moist Heat Location Lumbar Spine;Hip      Manual Therapy   Manual Therapy Joint mobilization    Manual therapy comments MET to correct L posterior pelvic rotation                    PT Short Term Goals - 04/29/20 1830      PT SHORT TERM GOAL #1   Title patien to demo HEP back to PT w/o need of VCs    Baseline issued HEP today    Time 4    Period Weeks    Status New    Target Date 05/25/20      PT SHORT TERM GOAL #2   Title Assess BERG test and stablish goal    Baseline UTA due to time constraints; 04/29/20 BERG score 49/56, goal is 52    Time 4    Period Weeks    Status Achieved    Target Date 05/25/20      PT SHORT TERM GOAL #3   Title Assess FGA/DGI and establish goals    Baseline UTA due to time constraints; 04/29/20 FGA score 19/30, goal is 24    Time 4    Period Weeks    Status Achieved    Target Date 05/25/20      PT SHORT TERM GOAL #4   Title Patient to ambulate 565f across level ground with LRAD under S    Baseline 519fclinic distancs w/o need of AD    Time 4    Period Weeks    Status New    Target Date 05/25/20             PT Long Term Goals - 04/27/20 1352      PT LONG TERM GOAL #1   Title Improve TUG score from 14.9 secs to </= 13.5 secs without device to decr. fall risk.    Baseline 14.9s w/o AD    Time 8    Period Weeks    Status New    Target Date 06/22/20      PT LONG TERM GOAL #2   Title Assess progress towards BERG goal    Baseline not set    Time  8    Period Weeks    Status New    Target Date 06/22/20      PT LONG TERM GOAL #3   Title Assess progress towards FGA/DGI    Baseline not set    Time 8    Period Weeks    Status New    Target Date 06/22/20      PT LONG TERM GOAL #4   Title Patient to ambulat 100090fcross all surfaces with LRAD under S    Baseline 23f66fross level ground w/o AD    Time 8    Period Weeks    Status New    Target Date 06/22/20                 Plan - 05/11/20 1132    Clinical Impression Statement Continured moist heat, re-assessed pelvic alignment, posterior L ilium rotation appears less, continued to emphasize lumbosacral strength and stabilization as well as activities to preserve pelvic alignment, added core exercises in sitting and supine, pelvic alignment and leg length discrepancy symmetrical at end  of session    Personal Factors and Comorbidities Comorbidity 2;Time since onset of injury/illness/exacerbation    Comorbidities CVA, chronic LBP    Examination-Activity Limitations Locomotion Level    Stability/Clinical Decision Making Stable/Uncomplicated    Rehab Potential Good    PT Frequency 2x / week    PT Treatment/Interventions ADLs/Self Care Home Management;Aquatic Therapy;DME Instruction;Gait training;Stair training;Functional mobility training;Therapeutic activities;Therapeutic exercise;Balance training;Neuromuscular re-education;Patient/family education;Orthotic Fit/Training    PT Next Visit Plan reassess pelvic alignment , review HEP, check benefit of MET, continue core strengthening    PT Home Exercise Plan [email or web address]    Consulted and Agree with Plan of Care Patient           Patient will benefit from skilled therapeutic intervention in order to improve the following deficits and impairments:  Abnormal gait,Decreased range of motion,Difficulty walking,Decreased endurance,Decreased activity tolerance,Decreased balance,Decreased mobility,Decreased strength  Visit  Diagnosis: Unsteadiness on feet  Muscle weakness (generalized)  Sciatica, left side  Other abnormalities of gait and mobility     Problem List Patient Active Problem List   Diagnosis Date Noted  . Lumbar spinal stenosis 08/15/2019  . Nonspecific chest pain 08/11/2019  . Shortness of breath   . 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 ([place]) 10/21/2018  . Nocturnal hypoxemia 10/21/2018  . Radiation therapy complication 73/41/9379  . Primary prostate cancer ([place]) 07/31/2017  . Anemia 07/31/2017  . Vertigo due to cerebrovascular disease 07/31/2017  . Poor compliance with CPAP treatment 07/31/2017  . Absolute anemia 05/16/2017  . Avitaminosis D 05/16/2017  . Benign essential HTN 05/16/2017  . Benign prostatic hypertrophy without urinary obstruction 05/16/2017  . Clinical depression 05/16/2017  . CN (constipation) 05/16/2017  . Current drug use 05/16/2017  . Diabetic neuropathy ([place]) 05/16/2017  . Genital herpes 05/16/2017  . Infarction of lung due to iatrogenic pulmonary embolism ([place]) 05/16/2017  . Sciatica associated with disorder of lumbar spine 05/16/2017  . Arteriosclerosis of coronary artery 05/16/2017  . Artery disease, cerebral 05/16/2017  . Apnea, sleep 05/16/2017  . Arthralgia of hip or thigh 05/16/2017  . Malignant neoplasm of prostate ([place]) 09/28/2016  . Vascular dementia in remission ([place]) 09/28/2016  . Remote history of stroke 09/28/2016  . Acute kidney injury ([place]) 05/18/2016  . Dehydration 05/18/2016  . Near syncope 05/18/2016  . Orthostatic hypotension 05/18/2016  . CKD (chronic kidney disease), stage II 04/26/2016  . UTI (urinary tract infection) 04/26/2016  . Encounter for counseling on use of CPAP 11/18/2015  . Chronic cholecystitis with calculus 11/02/2015  . Stroke, 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[place]) 04/22/2015  . TIA (transient ischemic attack) 11/03/2014  . OSA on CPAP 05/18/2014  . White matter disease 08/14/2013  . OSA (obstructive sleep  apnea) 08/14/2013  . Abnormal x-ray of temporomandibular joint 05/30/2013  . Chronic infection of sinus 05/30/2013  . Cough 05/30/2013  . Fatigue 05/30/2013  . Unsteady gait 05/19/2013  . Combined fat and carbohydrate induced hyperlipemia 04/24/2013  . Syncope 03/20/2013  . Angina pectoris ([place]) 10/08/2012  . Hypertension   . Stroke ([place])   . History of TIAs   . Lumbago   . Other and unspecified hyperlipidemia   . Personal history of unspecified circulatory disease   . Unspecified fall   . Pain in joint, multiple sites   . Degeneration of cervical intervertebral disc   . Unspecified cardiovascular disease   . History of pulmonary embolism: June 2014,  Takes Xarelto 07/01/2012    Class: History of  . Hemiparesis ([place]) 07/18/2011  . Diabetes mellitus type 2  with complications ([place]) 97/67/3419  . HTN (hypertension) 07/18/2011  . CAD in native artery 07/18/2011  . History of recurrent TIAs 07/18/2011    Lanice Shirts PT 05/11/2020, 12:16 PM  [facility] [address] [place], [place], [address] Phone: [phone]   Fax:  [phone]  Name: Armanie Martine MRN: [id number] Date of Birth: Feb 01, 1940

## 2020-05-13 ENCOUNTER — Ambulatory Visit: Payer: Medicare Other

## 2020-05-13 ENCOUNTER — Other Ambulatory Visit: Payer: Self-pay

## 2020-05-13 DIAGNOSIS — M5432 Sciatica, left side: Secondary | ICD-10-CM | POA: Diagnosis not present

## 2020-05-13 DIAGNOSIS — M6281 Muscle weakness (generalized): Secondary | ICD-10-CM | POA: Diagnosis not present

## 2020-05-13 DIAGNOSIS — R2681 Unsteadiness on feet: Secondary | ICD-10-CM | POA: Diagnosis not present

## 2020-05-13 DIAGNOSIS — R2689 Other abnormalities of gait and mobility: Secondary | ICD-10-CM

## 2020-05-13 NOTE — Therapy (Signed)
[facility] [address] [place], [place], [address] Phone: [phone]   Fax:  [phone]  Physical Therapy Treatment  Patient Details  Name: Steve Brown MRN: [id number] Date of Birth: [birth year] Referring Provider (PT): Lona Kettle MD   Encounter Date: 05/13/2020   PT End of Session - 05/13/20 1201    Visit Number 6    Number of Visits 17    Date for PT Re-Evaluation 06/29/20    Authorization Type [employer] A[employer]    PT Start Time 1150    PT Stop Time 1230    PT Time Calculation (min) 40 min    Activity Tolerance Patient tolerated treatment well    Behavior During Therapy [facility] for tasks assessed/performed           Past Medical History:  Diagnosis Date  . Abnormal prostate biopsy   . Anticoagulant long-term use    currently xarelto  . BPH with elevated PSA   . CKD (chronic kidney disease), stage II   . Complication of anesthesia    limted neck rom limited use of left arm due to cva  . Coronary artery disease    CARDIOLOGIST-  DR Irish Lack--  2010-- PCI w/ stenting midLAD  . DDD (degenerative disc disease), lumbar   . Degeneration of cervical intervertebral disc   . Depression   . Dyspnea on exertion   . GERD (gastroesophageal reflux disease)   . Hemiparesis due to cerebral infarction   . History of cerebrovascular accident (CVA) with residual deficit 2002 and 2003--  hemiparisis both sides   per MRI  anterior left frontal lobe, left para midline pons, and inferior cerebullam bilaterally infarcts  . History of pulmonary embolus (PE)    06-30-2012  extensive bilaterally  . History of recurrent TIAs   . History of syncope    hx multiple pre-syncope and syncopal episodes due to vasovagal, orthostatic hypotension, dehydration  . History of TIAs    several since 2002  . Hyperlipidemia   . Hypertension   . Mild atherosclerosis of carotid artery, bilateral    per last duplex 11-04-2014  bilateral ICA 1--39%  .  Neuropathy    fingers  . OSA on CPAP    followed by dr dohmeier--  sev. osa w/ AHI 65.9  . Prostate cancer ([place]) dx 2018  . Renal insufficiency   . S/P coronary artery stent placement 2010   stenting to mid LAD  . Simple renal cyst    bilaterally  . Stroke ([place])   . Trigger finger of both hands 11-17-13  . Type 2 diabetes mellitus ([place]) dx 1986   last one A1c 9.2 on 04-26-2016  . Unsteady gait    . Hx prior CVA/TIAs;  . Vertebral artery occlusion, left    chronic    Past Surgical History:  Procedure Laterality Date  . ANTERIOR CERVICAL DECOMP/DISCECTOMY FUSION  2004   C3 -- C6 limited rom  . CARDIAC CATHETERIZATION  06-10-2010   dr Irish Lack   wide patent LAD stent, mid lesion at the origin of the septal prior to the previous stent 40-50%/  normal LVF, ef 55%  . CARDIOVASCULAR STRESS TEST  10-23-2012  dr Irish Lack   normal nuclear perfusion study w/ no ischemia/  normal LV function and wall motion , ef 65%  . CARPAL TUNNEL RELEASE Bilateral   . CATARACT EXTRACTION W/ INTRAOCULAR LENS  IMPLANT, BILATERAL    . CHOLECYSTECTOMY N/A 11/02/2015   Procedure: LAPAROSCOPIC CHOLECYSTECTOMY WITH INTRAOPERATIVE  CHOLANGIOGRAM;  Surgeon: Donnie Mesa, MD;  Location: [facility];  Service: General;  Laterality: N/A;  . COLONOSCOPY    . CORONARY ANGIOPLASTY WITH STENT PLACEMENT  02/2008   stenting to mid LAD  . GOLD SEED IMPLANT N/A 11/15/2016   Procedure: GOLD SEED IMPLANT TIMES THREE;  Surgeon: Ardis Hughs, MD;  Location: [facility];  Service: Urology;  Laterality: N/A;  . IR ANGIO INTRA EXTRACRAN SEL COM CAROTID INNOMINATE BILAT MOD SED  06/13/2018  . IR ANGIO VERTEBRAL SEL VERTEBRAL UNI R MOD SED  06/13/2018  . IR US GUIDE VASC ACCESS RIGHT  06/13/2018  . LEFT HEART CATH AND CORONARY ANGIOGRAPHY N/A 05/25/2017   Procedure: LEFT HEART CATH AND CORONARY ANGIOGRAPHY;  Surgeon: Jettie Booze, MD;  Location: [facility];  Service: Cardiovascular;  Laterality: N/A;  .  LEFT HEART CATHETERIZATION WITH CORONARY ANGIOGRAM N/A 04/03/2013   Procedure: LEFT HEART CATHETERIZATION WITH CORONARY ANGIOGRAM;  Surgeon: Jettie Booze, MD;  Location: [facility] CATH LAB;  Service: Cardiovascular;  Laterality: N/A;  patent mLAD stent  w/ mild disease in remainder LAD and its branches;  mod. focal lesion midLCFx- FFR of lesion was negative for ischemia/  normal LVSF, ef 50%  . lungs  2005   "fluid pumped off lungs"  . NEUROPLASTY / TRANSPOSITION ULNAR NERVE AT ELBOW Right 2004  . PROSTATE BIOPSY N/A 08/31/2016   Procedure: PROSTATE  BIOPSY TRANSRECTAL ULTRASONIC PROSTATE (TUBP);  Surgeon: Ardis Hughs, MD;  Location[facility];  Service: Urology;  Laterality: N/A;  . SPACE OAR INSTILLATION N/A 11/15/2016   Procedure: SPACE OAR INSTILLATION;  Surgeon: Ardis Hughs, MD;  Location: [facility];  Service: Urology;  Laterality: N/A;  . TRANSTHORACIC ECHOCARDIOGRAM  04/27/2016   severe focal basal LVH, ef 60-65%,  grade 2 diastoilc dysfunction/  mild AR, MR, and TR/  atrial septum lipomatous hypertrophy/  PASP 3mmHg  . UMBILICAL HERNIA REPAIR      There were no vitals filed for this visit.   Subjective Assessment - 05/13/20 1157    Subjective Reports symptoms unchanged since last visit but has not had to rely on medicatin for pain relief    Pertinent History CVA in 07/2017    Limitations Walking    How long can you sit comfortably? unimited    How long can you stand comfortably? <15 minutes    How long can you walk comfortably? <15 minutes    Pain Type Chronic pain    Pain Onset More than a month ago    Pain Frequency Intermittent                             [facility] Adult PT Treatment/Exercise - 05/13/20 0001      Lumbar Exercises: Stretches   Single Knee to Chest Stretch Left;3 reps;30 seconds;Limitations    Single Knee to Chest Stretch Limitations used towel      Lumbar Exercises: Supine   Ab Set 10  reps;Limitations    AB Set Limitations curl ups 2x10    Bridge with [employer] Squeeze Non-compliant;15 reps;Limitations    Bridge with [employer] Limitations 2x15    Other Supine Lumbar Exercises supine march 2x15, 1#      Knee/Hip Exercises: Standing   Functional Squat 1 set;10 reps;Limitations    Functional Squat Limitations encouraged foot flat  PT Short Term Goals - 04/29/20 1830      PT SHORT TERM GOAL #1   Title patien to demo HEP back to PT w/o need of VCs    Baseline issued HEP today    Time 4    Period Weeks    Status New    Target Date 05/25/20      PT SHORT TERM GOAL #2   Title Assess BERG test and stablish goal    Baseline UTA due to time constraints; 04/29/20 BERG score 49/56, goal is 52    Time 4    Period Weeks    Status Achieved    Target Date 05/25/20      PT SHORT TERM GOAL #3   Title Assess FGA/DGI and establish goals    Baseline UTA due to time constraints; 04/29/20 FGA score 19/30, goal is 24    Time 4    Period Weeks    Status Achieved    Target Date 05/25/20      PT SHORT TERM GOAL #4   Title Patient to ambulate 573ft across level ground with LRAD under S    Baseline 39ft clinic distancs w/o need of AD    Time 4    Period Weeks    Status New    Target Date 05/25/20             PT Long Term Goals - 04/27/20 1352      PT LONG TERM GOAL #1   Title Improve TUG score from 14.9 secs to </= 13.5 secs without device to decr. fall risk.    Baseline 14.9s w/o AD    Time 8    Period Weeks    Status New    Target Date 06/22/20      PT LONG TERM GOAL #2   Title Assess progress towards BERG goal    Baseline not set    Time 8    Period Weeks    Status New    Target Date 06/22/20      PT LONG TERM GOAL #3   Title Assess progress towards FGA/DGI    Baseline not set    Time 8    Period Weeks    Status New    Target Date 06/22/20      PT LONG TERM GOAL #4   Title Patient to ambulat 1024ft across all surfaces with  LRAD under S    Baseline 52ft across level ground w/o AD    Time 8    Period Weeks    Status New    Target Date 06/22/20                 Plan - 05/13/20 1216    Clinical Impression Statement leg length sequal at start of session, continued core and spine stabilzation, introduced piriformis stretching as well as adding wt/resistance to tasks, began abdominal/core strengthening with difficulty noted in activating abdominal and core musculature    Personal Factors and Comorbidities Comorbidity 2;Time since onset of injury/illness/exacerbation    Comorbidities CVA, chronic LBP    Examination-Activity Limitations Locomotion Level    Stability/Clinical Decision Making Stable/Uncomplicated    Rehab Potential Good    PT Frequency 2x / week    PT Treatment/Interventions ADLs/Self Care Home Management;Aquatic Therapy;DME Instruction;Gait training;Stair training;Functional mobility training;Therapeutic activities;Therapeutic exercise;Balance training;Neuromuscular re-education;Patient/family education;Orthotic Fit/Training    PT Next Visit Plan reassess pelvic alignment , review HEP, check benefit of MET, continue core strengthening    PT  Home Exercise Plan [email or web address]    Consulted and Agree with Plan of Care Patient           Patient will benefit from skilled therapeutic intervention in order to improve the following deficits and impairments:  Abnormal gait,Decreased range of motion,Difficulty walking,Decreased endurance,Decreased activity tolerance,Decreased balance,Decreased mobility,Decreased strength  Visit Diagnosis: Unsteadiness on feet  Muscle weakness (generalized)  Sciatica, left side  Other abnormalities of gait and mobility     Problem List Patient Active Problem List   Diagnosis Date Noted  . Lumbar spinal stenosis 08/15/2019  . Nonspecific chest pain 08/11/2019  . Shortness of breath   . 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 ([place]) 10/21/2018  .  Nocturnal hypoxemia 10/21/2018  . Radiation therapy complication 77/82/4235  . Primary prostate cancer ([place]) 07/31/2017  . Anemia 07/31/2017  . Vertigo due to cerebrovascular disease 07/31/2017  . Poor compliance with CPAP treatment 07/31/2017  . Absolute anemia 05/16/2017  . Avitaminosis D 05/16/2017  . Benign essential HTN 05/16/2017  . Benign prostatic hypertrophy without urinary obstruction 05/16/2017  . Clinical depression 05/16/2017  . CN (constipation) 05/16/2017  . Current drug use 05/16/2017  . Diabetic neuropathy ([place]) 05/16/2017  . Genital herpes 05/16/2017  . Infarction of lung due to iatrogenic pulmonary embolism ([place]) 05/16/2017  . Sciatica associated with disorder of lumbar spine 05/16/2017  . Arteriosclerosis of coronary artery 05/16/2017  . Artery disease, cerebral 05/16/2017  . Apnea, sleep 05/16/2017  . Arthralgia of hip or thigh 05/16/2017  . Malignant neoplasm of prostate ([place]) 09/28/2016  . Vascular dementia in remission ([place]) 09/28/2016  . Remote history of stroke 09/28/2016  . Acute kidney injury ([place]) 05/18/2016  . Dehydration 05/18/2016  . Near syncope 05/18/2016  . Orthostatic hypotension 05/18/2016  . CKD (chronic kidney disease), stage II 04/26/2016  . UTI (urinary tract infection) 04/26/2016  . Encounter for counseling on use of CPAP 11/18/2015  . Chronic cholecystitis with calculus 11/02/2015  . Stroke, 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[place]) 04/22/2015  . TIA (transient ischemic attack) 11/03/2014  . OSA on CPAP 05/18/2014  . White matter disease 08/14/2013  . OSA (obstructive sleep apnea) 08/14/2013  . Abnormal x-ray of temporomandibular joint 05/30/2013  . Chronic infection of sinus 05/30/2013  . Cough 05/30/2013  . Fatigue 05/30/2013  . Unsteady gait 05/19/2013  . Combined fat and carbohydrate induced hyperlipemia 04/24/2013  . Syncope 03/20/2013  . Angina pectoris ([place]) 10/08/2012  . Hypertension   . Stroke ([place])   . History of TIAs   . Lumbago   .  Other and unspecified hyperlipidemia   . Personal history of unspecified circulatory disease   . Unspecified fall   . Pain in joint, multiple sites   . Degeneration of cervical intervertebral disc   . Unspecified cardiovascular disease   . History of pulmonary embolism: June 2014,  Takes Xarelto 07/01/2012    Class: History of  . Hemiparesis ([place]) 07/18/2011  . Diabetes mellitus type 2 with complications ([place]) 36/14/4315  . HTN (hypertension) 07/18/2011  . CAD in native artery 07/18/2011  . History of recurrent TIAs 07/18/2011    Lanice Shirts PT 05/13/2020, 2:35 PM  [facility] [address] [place], [place], [address] Phone: [phone]   Fax:  [phone]  Name: Deyton Ellenbecker MRN: [id number] Date of Birth: 1940/07/21

## 2020-05-17 DIAGNOSIS — Z09 Encounter for follow-up examination after completed treatment for conditions other than malignant neoplasm: Secondary | ICD-10-CM | POA: Diagnosis not present

## 2020-05-17 DIAGNOSIS — H1131 Conjunctival hemorrhage, right eye: Secondary | ICD-10-CM | POA: Diagnosis not present

## 2020-05-18 ENCOUNTER — Ambulatory Visit: Payer: Medicare Other

## 2020-05-18 ENCOUNTER — Other Ambulatory Visit: Payer: Self-pay

## 2020-05-18 DIAGNOSIS — R2681 Unsteadiness on feet: Secondary | ICD-10-CM

## 2020-05-18 DIAGNOSIS — R2689 Other abnormalities of gait and mobility: Secondary | ICD-10-CM | POA: Diagnosis not present

## 2020-05-18 DIAGNOSIS — M5432 Sciatica, left side: Secondary | ICD-10-CM | POA: Diagnosis not present

## 2020-05-18 DIAGNOSIS — M6281 Muscle weakness (generalized): Secondary | ICD-10-CM | POA: Diagnosis not present

## 2020-05-18 NOTE — Therapy (Signed)
[facility] [address] [place], [place], [address] Phone: [phone]   Fax:  [phone]  Physical Therapy Treatment  Patient Details  Name: Steve Brown MRN: [id number] Date of Birth: [birth year] Referring Provider (PT): Lona Kettle MD   Encounter Date: 05/18/2020   PT End of Session - 05/18/20 1301    Visit Number 7    Number of Visits 17    Date for PT Re-Evaluation 06/29/20    Authorization Type [employer] A[employer]    PT Start Time [id number]    PT Stop Time 1230    PT Time Calculation (min) 45 min    Activity Tolerance Patient tolerated treatment well    Behavior During Therapy [facility] for tasks assessed/performed           Past Medical History:  Diagnosis Date  . Abnormal prostate biopsy   . Anticoagulant long-term use    currently xarelto  . BPH with elevated PSA   . CKD (chronic kidney disease), stage II   . Complication of anesthesia    limted neck rom limited use of left arm due to cva  . Coronary artery disease    CARDIOLOGIST-  DR Irish Lack--  2010-- PCI w/ stenting midLAD  . DDD (degenerative disc disease), lumbar   . Degeneration of cervical intervertebral disc   . Depression   . Dyspnea on exertion   . GERD (gastroesophageal reflux disease)   . Hemiparesis due to cerebral infarction   . History of cerebrovascular accident (CVA) with residual deficit 2002 and 2003--  hemiparisis both sides   per MRI  anterior left frontal lobe, left para midline pons, and inferior cerebullam bilaterally infarcts  . History of pulmonary embolus (PE)    06-30-2012  extensive bilaterally  . History of recurrent TIAs   . History of syncope    hx multiple pre-syncope and syncopal episodes due to vasovagal, orthostatic hypotension, dehydration  . History of TIAs    several since 2002  . Hyperlipidemia   . Hypertension   . Mild atherosclerosis of carotid artery, bilateral    per last duplex 11-04-2014  bilateral ICA 1--39%   . Neuropathy    fingers  . OSA on CPAP    followed by dr dohmeier--  sev. osa w/ AHI 65.9  . Prostate cancer ([place]) dx 2018  . Renal insufficiency   . S/P coronary artery stent placement 2010   stenting to mid LAD  . Simple renal cyst    bilaterally  . Stroke ([place])   . Trigger finger of both hands 11-17-13  . Type 2 diabetes mellitus ([place]) dx 1986   last one A1c 9.2 on 04-26-2016  . Unsteady gait    . Hx prior CVA/TIAs;  . Vertebral artery occlusion, left    chronic    Past Surgical History:  Procedure Laterality Date  . ANTERIOR CERVICAL DECOMP/DISCECTOMY FUSION  2004   C3 -- C6 limited rom  . CARDIAC CATHETERIZATION  06-10-2010   dr Irish Lack   wide patent LAD stent, mid lesion at the origin of the septal prior to the previous stent 40-50%/  normal LVF, ef 55%  . CARDIOVASCULAR STRESS TEST  10-23-2012  dr Irish Lack   normal nuclear perfusion study w/ no ischemia/  normal LV function and wall motion , ef 65%  . CARPAL TUNNEL RELEASE Bilateral   . CATARACT EXTRACTION W/ INTRAOCULAR LENS  IMPLANT, BILATERAL    . CHOLECYSTECTOMY N/A 11/02/2015   Procedure: LAPAROSCOPIC CHOLECYSTECTOMY WITH INTRAOPERATIVE  CHOLANGIOGRAM;  Surgeon: Donnie Mesa, MD;  Location: [facility];  Service: General;  Laterality: N/A;  . COLONOSCOPY    . CORONARY ANGIOPLASTY WITH STENT PLACEMENT  02/2008   stenting to mid LAD  . GOLD SEED IMPLANT N/A 11/15/2016   Procedure: GOLD SEED IMPLANT TIMES THREE;  Surgeon: Ardis Hughs, MD;  Location: [facility];  Service: Urology;  Laterality: N/A;  . IR ANGIO INTRA EXTRACRAN SEL COM CAROTID INNOMINATE BILAT MOD SED  06/13/2018  . IR ANGIO VERTEBRAL SEL VERTEBRAL UNI R MOD SED  06/13/2018  . IR US GUIDE VASC ACCESS RIGHT  06/13/2018  . LEFT HEART CATH AND CORONARY ANGIOGRAPHY N/A 05/25/2017   Procedure: LEFT HEART CATH AND CORONARY ANGIOGRAPHY;  Surgeon: Jettie Booze, MD;  Location: [facility];  Service: Cardiovascular;  Laterality: N/A;   . LEFT HEART CATHETERIZATION WITH CORONARY ANGIOGRAM N/A 04/03/2013   Procedure: LEFT HEART CATHETERIZATION WITH CORONARY ANGIOGRAM;  Surgeon: Jettie Booze, MD;  Location: [facility];  Service: Cardiovascular;  Laterality: N/A;  patent mLAD stent  w/ mild disease in remainder LAD and its branches;  mod. focal lesion midLCFx- FFR of lesion was negative for ischemia/  normal LVSF, ef 50%  . lungs  2005   "fluid pumped off lungs"  . NEUROPLASTY / TRANSPOSITION ULNAR NERVE AT ELBOW Right 2004  . PROSTATE BIOPSY N/A 08/31/2016   Procedure: PROSTATE  BIOPSY TRANSRECTAL ULTRASONIC PROSTATE (TUBP);  Surgeon: Ardis Hughs, MD;  Location: [facility];  Service: Urology;  Laterality: N/A;  . SPACE OAR INSTILLATION N/A 11/15/2016   Procedure: SPACE OAR INSTILLATION;  Surgeon: Ardis Hughs, MD;  [facility]: [facility];  Service: Urology;  Laterality: N/A;  . TRANSTHORACIC ECHOCARDIOGRAM  04/27/2016   severe focal basal LVH, ef 60-65%,  grade 2 diastoilc dysfunction/  mild AR, MR, and TR/  atrial septum lipomatous hypertrophy/  PASP 101mmHg  . UMBILICAL HERNIA REPAIR      There were no vitals filed for this visit.   Subjective Assessment - 05/18/20 1200    Subjective Has been more active since last session with continued reports of improving symptoms, no falls or med changes, has been compliant with HEP and feels exercises are helping    Pertinent History CVA in 07/2017    Limitations Walking    How long can you sit comfortably? unimited    How long can you stand comfortably? <15 minutes    How long can you walk comfortably? <15 minutes    Pain Onset More than a month ago                             [facility] Adult PT Treatment/Exercise - 05/18/20 0001      Lumbar Exercises: Stretches   Single Knee to Chest Stretch Left;3 reps;30 seconds    Single Knee to Chest Stretch Limitations towel to extend reach    Lower Trunk Rotation 30  seconds;Limitations    Lower Trunk Rotation Limitations 2x30s    Figure 4 Stretch 3 reps;30 seconds;Supine      Lumbar Exercises: Supine   Ab Set 10 reps;Limitations    AB Set Limitations curl ups  2x10    Pelvic Tilt 10 reps;Limitations    Pelvic Tilt Limitations 2x10    Other Supine Lumbar Exercises supine march 2x10 with PT    Other Supine Lumbar Exercises alt UE flexion with PT, 2x10  Modalities   Modalities Moist Heat      Moist Heat Therapy   Number Minutes Moist Heat 10 Minutes    Moist Heat Location Lumbar Spine;Hip                    PT Short Term Goals - 04/29/20 1830      PT SHORT TERM GOAL #1   Title patien to demo HEP back to PT w/o need of VCs    Baseline issued HEP today    Time 4    Period Weeks    Status New    Target Date 05/25/20      PT SHORT TERM GOAL #2   Title Assess BERG test and stablish goal    Baseline UTA due to time constraints; 04/29/20 BERG score 49/56, goal is 52    Time 4    Period Weeks    Status Achieved    Target Date 05/25/20      PT SHORT TERM GOAL #3   Title Assess FGA/DGI and establish goals    Baseline UTA due to time constraints; 04/29/20 FGA score 19/30, goal is 24    Time 4    Period Weeks    Status Achieved    Target Date 05/25/20      PT SHORT TERM GOAL #4   Title Patient to ambulate 563ft across level ground with LRAD under S    Baseline 12ft clinic distancs w/o need of AD    Time 4    Period Weeks    Status New    Target Date 05/25/20             PT Long Term Goals - 04/27/20 1352      PT LONG TERM GOAL #1   Title Improve TUG score from 14.9 secs to </= 13.5 secs without device to decr. fall risk.    Baseline 14.9s w/o AD    Time 8    Period Weeks    Status New    Target Date 06/22/20      PT LONG TERM GOAL #2   Title Assess progress towards BERG goal    Baseline not set    Time 8    Period Weeks    Status New    Target Date 06/22/20      PT LONG TERM GOAL #3   Title Assess  progress towards FGA/DGI    Baseline not set    Time 8    Period Weeks    Status New    Target Date 06/22/20      PT LONG TERM GOAL #4   Title Patient to ambulat 1077ft across all surfaces with LRAD under S    Baseline 15ft across level ground w/o AD    Time 8    Period Weeks    Status New    Target Date 06/22/20                 Plan - 05/18/20 1302    Clinical Impression Statement Continued to build on strength and stertch tasks, patient reporting improving symptoms especially after session and carrying over b/t sessions, patient showing improving flexibility during stretch tasks and demos imptove core stability and muscle activation, minimal to no limp entering clinic, overall mobility and soft tissue restrictions are improving    Personal Factors and Comorbidities Comorbidity 2;Time since onset of injury/illness/exacerbation    Comorbidities CVA, chronic LBP    Examination-Activity Limitations Locomotion Level  Stability/Clinical Decision Making Stable/Uncomplicated    Rehab Potential Good    PT Frequency 2x / week    PT Treatment/Interventions ADLs/Self Care Home Management;Aquatic Therapy;DME Instruction;Gait training;Stair training;Functional mobility training;Therapeutic activities;Therapeutic exercise;Balance training;Neuromuscular re-education;Patient/family education;Orthotic Fit/Training    PT Next Visit Plan review pelvic alignment , review HEP, continue core strengthening and stretching, assess STGs    PT Home Exercise Plan [email or web address]    Consulted and Agree with Plan of Care Patient           Patient will benefit from skilled therapeutic intervention in order to improve the following deficits and impairments:  Abnormal gait,Decreased range of motion,Difficulty walking,Decreased endurance,Decreased activity tolerance,Decreased balance,Decreased mobility,Decreased strength  Visit Diagnosis: Unsteadiness on feet  Muscle weakness (generalized)  Sciatica, left  side  Other abnormalities of gait and mobility     Problem List Patient Active Problem List   Diagnosis Date Noted  . Lumbar spinal stenosis 08/15/2019  . Nonspecific chest pain 08/11/2019  . Shortness of breath   . 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 ([place]) 10/21/2018  . Nocturnal hypoxemia 10/21/2018  . Radiation therapy complication 81/27/5170  . Primary prostate cancer ([place]) 07/31/2017  . Anemia 07/31/2017  . Vertigo due to cerebrovascular disease 07/31/2017  . Poor compliance with CPAP treatment 07/31/2017  . Absolute anemia 05/16/2017  . Avitaminosis D 05/16/2017  . Benign essential HTN 05/16/2017  . Benign prostatic hypertrophy without urinary obstruction 05/16/2017  . Clinical depression 05/16/2017  . CN (constipation) 05/16/2017  . Current drug use 05/16/2017  . Diabetic neuropathy ([place]) 05/16/2017  . Genital herpes 05/16/2017  . Infarction of lung due to iatrogenic pulmonary embolism ([place]) 05/16/2017  . Sciatica associated with disorder of lumbar spine 05/16/2017  . Arteriosclerosis of coronary artery 05/16/2017  . Artery disease, cerebral 05/16/2017  . Apnea, sleep 05/16/2017  . Arthralgia of hip or thigh 05/16/2017  . Malignant neoplasm of prostate ([place]) 09/28/2016  . Vascular dementia in remission ([place]) 09/28/2016  . Remote history of stroke 09/28/2016  . Acute kidney injury ([place]) 05/18/2016  . Dehydration 05/18/2016  . Near syncope 05/18/2016  . Orthostatic hypotension 05/18/2016  . CKD (chronic kidney disease), stage II 04/26/2016  . UTI (urinary tract infection) 04/26/2016  . Encounter for counseling on use of CPAP 11/18/2015  . Chronic cholecystitis with calculus 11/02/2015  . 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 TIA (transient ischemic attack) 11/03/2014  . OSA on CPAP 05/18/2014  . White matter disease 08/14/2013  . OSA (obstructive sleep apnea) 08/14/2013  . Abnormal x-ray of temporomandibular joint 05/30/2013  . Chronic  infection of sinus 05/30/2013  . Cough 05/30/2013  . Fatigue 05/30/2013  . Unsteady gait 05/19/2013  . Combined fat and carbohydrate induced hyperlipemia 04/24/2013  . Syncope 03/20/2013  . Angina pectoris ([place]) 10/08/2012  . Hypertension   . Stroke ([place])   . History of TIAs   . Lumbago   . Other and unspecified hyperlipidemia   . Personal history of unspecified circulatory disease   . Unspecified fall   . Pain in joint, multiple sites   . Degeneration of cervical intervertebral disc   . Unspecified cardiovascular disease   . History of pulmonary embolism: June 2014,  Takes Xarelto 07/01/2012    Class: History of  . Hemiparesis ([place]) 07/18/2011  . Diabetes mellitus type 2 with complications ([place]) 01/74/9449  . HTN (hypertension) 07/18/2011  . CAD in native artery 07/18/2011  . History of recurrent TIAs 07/18/2011    Jacqulynn Cadet Letanya Froh PT 05/18/2020, 1:11  PM  [facility] [address] [place], [place], [address] Phone: [phone]   Fax:  [phone]  Name: Kuper Rennels MRN: [id number] Date of Birth: 1940/03/08

## 2020-05-20 DIAGNOSIS — E7800 Pure hypercholesterolemia, unspecified: Secondary | ICD-10-CM | POA: Diagnosis not present

## 2020-05-20 DIAGNOSIS — I10 Essential (primary) hypertension: Secondary | ICD-10-CM | POA: Diagnosis not present

## 2020-05-20 DIAGNOSIS — E1165 Type 2 diabetes mellitus with hyperglycemia: Secondary | ICD-10-CM | POA: Diagnosis not present

## 2020-05-21 ENCOUNTER — Encounter: Payer: Self-pay | Admitting: Physical Therapy

## 2020-05-21 ENCOUNTER — Ambulatory Visit: Payer: Medicare Other | Admitting: Physical Therapy

## 2020-05-21 ENCOUNTER — Other Ambulatory Visit: Payer: Self-pay

## 2020-05-21 DIAGNOSIS — R2681 Unsteadiness on feet: Secondary | ICD-10-CM | POA: Diagnosis not present

## 2020-05-21 DIAGNOSIS — M6281 Muscle weakness (generalized): Secondary | ICD-10-CM | POA: Diagnosis not present

## 2020-05-21 DIAGNOSIS — M5432 Sciatica, left side: Secondary | ICD-10-CM

## 2020-05-21 DIAGNOSIS — R2689 Other abnormalities of gait and mobility: Secondary | ICD-10-CM | POA: Diagnosis not present

## 2020-05-21 NOTE — Therapy (Addendum)
[facility] [address] [place], [place], [address] Phone: [phone]   Fax:  [phone]  Physical Therapy Treatment/DC Summary  Patient Details  Name: Steve Brown MRN: [id number] Date of Birth: Aug 28, 1940 Referring Provider (PT): Gildardo Cranker MD   Encounter Date: 05/21/2020   PT End of Session - 05/21/20 1020     Visit Number 8    Number of Visits 17    Date for PT Re-Evaluation 06/29/20    Authorization Type [facility] A[employer]    PT Start Time 1016    PT Stop Time 1100    PT Time Calculation (min) 44 min    Activity Tolerance Patient tolerated treatment well;No increased pain    Behavior During Therapy WFL for tasks assessed/performed             Past Medical History:  Diagnosis Date   Abnormal prostate biopsy    Anticoagulant long-term use    currently xarelto   BPH with elevated PSA    CKD (chronic kidney disease), stage II    Complication of anesthesia    limted neck rom limited use of left arm due to cva   Coronary artery disease    CARDIOLOGIST-  DR Eldridge Dace--  2010-- PCI w/ stenting midLAD   DDD (degenerative disc disease), lumbar    Degeneration of cervical intervertebral disc    Depression    Dyspnea on exertion    GERD (gastroesophageal reflux disease)    Hemiparesis due to cerebral infarction    History of cerebrovascular accident (CVA) with residual deficit 2002 and 2003--  hemiparisis both sides   per MRI  anterior left frontal lobe, left para midline pons, and inferior cerebullam bilaterally infarcts   History of pulmonary embolus (PE)    06-30-2012  extensive bilaterally   History of recurrent TIAs    History of syncope    hx multiple pre-syncope and syncopal episodes due to vasovagal, orthostatic hypotension, dehydration   History of TIAs    several since 2002   Hyperlipidemia    Hypertension    Mild atherosclerosis of carotid artery, bilateral    per last duplex 11-04-2014   bilateral ICA 1--39%   Neuropathy    fingers   OSA on CPAP    followed by dr dohmeier--  sev. osa w/ AHI 65.9   Prostate cancer (HCC) dx 2018   Renal insufficiency    S/P coronary artery stent placement 2010   stenting to mid LAD   Simple renal cyst    bilaterally   Stroke [facility])    Trigger finger of both hands 11-17-13   Type 2 diabetes mellitus (HCC) dx 1986   last one A1c 9.2 on 04-26-2016   Unsteady gait    . Hx prior CVA/TIAs;   Vertebral artery occlusion, left    chronic    Past Surgical History:  Procedure Laterality Date   ANTERIOR CERVICAL DECOMP/DISCECTOMY FUSION  2004   C3 -- C6 limited rom   CARDIAC CATHETERIZATION  06-10-2010   dr Eldridge Dace   wide patent LAD stent, mid lesion at the origin of the septal prior to the previous stent 40-50%/  normal LVF, ef 55%   CARDIOVASCULAR STRESS TEST  10-23-2012  dr Eldridge Dace   normal nuclear perfusion study w/ no ischemia/  normal LV function and wall motion , ef 65%   CARPAL TUNNEL RELEASE Bilateral    CATARACT EXTRACTION W/ INTRAOCULAR LENS  IMPLANT, BILATERAL     CHOLECYSTECTOMY N/A 11/02/2015  Procedure: LAPAROSCOPIC CHOLECYSTECTOMY WITH INTRAOPERATIVE CHOLANGIOGRAM;  Surgeon: Manus Rudd, MD;  Location: [facility] OR;  Service: General;  Laterality: N/A;   COLONOSCOPY     CORONARY ANGIOPLASTY WITH STENT PLACEMENT  02/2008   stenting to mid LAD   GOLD SEED IMPLANT N/A 11/15/2016   Procedure: GOLD SEED IMPLANT TIMES THREE;  Surgeon: Crist Fat, MD;  Location: [facility];  Service: Urology;  Laterality: N/A;   IR ANGIO INTRA EXTRACRAN SEL COM CAROTID INNOMINATE BILAT MOD SED  06/13/2018   IR ANGIO VERTEBRAL SEL VERTEBRAL UNI R MOD SED  06/13/2018   IR US GUIDE VASC ACCESS RIGHT  06/13/2018   LEFT HEART CATH AND CORONARY ANGIOGRAPHY N/A 05/25/2017   Procedure: LEFT HEART CATH AND CORONARY ANGIOGRAPHY;  Surgeon: Corky Crafts, MD;  Location: [facility] INVASIVE CV LAB;  Service: Cardiovascular;  Laterality: N/A;    LEFT HEART CATHETERIZATION WITH CORONARY ANGIOGRAM N/A 04/03/2013   Procedure: LEFT HEART CATHETERIZATION WITH CORONARY ANGIOGRAM;  Surgeon: Corky Crafts, MD;  Location: [facility] CATH LAB;  Service: Cardiovascular;  Laterality: N/A;  patent mLAD stent  w/ mild disease in remainder LAD and its branches;  mod. focal lesion midLCFx- FFR of lesion was negative for ischemia/  normal LVSF, ef 50%   lungs  2005   "fluid pumped off lungs"   NEUROPLASTY / TRANSPOSITION ULNAR NERVE AT ELBOW Right 2004   PROSTATE BIOPSY N/A 08/31/2016   Procedure: PROSTATE  BIOPSY TRANSRECTAL ULTRASONIC PROSTATE (TUBP);  Surgeon: Crist Fat, MD;  Location: [facility];  Service: Urology;  Laterality: N/A;   SPACE OAR INSTILLATION N/A 11/15/2016   Procedure: SPACE OAR INSTILLATION;  Surgeon: Crist Fat, MD;  Location: [facility];  Service: Urology;  Laterality: N/A;   TRANSTHORACIC ECHOCARDIOGRAM  04/27/2016   severe focal basal LVH, ef 60-65%,  grade 2 diastoilc dysfunction/  mild AR, MR, and TR/  atrial septum lipomatous hypertrophy/  PASP   UMBILICAL HERNIA REPAIR      There were no vitals filed for this visit.   Subjective Assessment - 05/21/20 1017     Subjective Was going well then had a "bad day' yesterday with increased pain "all over". Took a tylenol with codein  to help it. Better today. Unsure of what caused his pain to flare up.    Pertinent History CVA in 07/2017    Limitations Walking    How long can you sit comfortably? unimited    How long can you stand comfortably? <15 minutes    How long can you walk comfortably? <15 minutes    Currently in Pain? Yes    Pain Score 8     Pain Location Generalized   back, left arm and leg   Pain Orientation Left;Lower    Pain Descriptors / Indicators Aching;Cramping;Sore    Pain Type Chronic pain    Pain Onset More than a month ago    Pain Frequency Intermittent    Aggravating Factors  unsure "it just flares up"     Pain Relieving Factors medication, heat, rest, stretching                   [facility] Adult PT Treatment/Exercise - 05/21/20 1021       Transfers   Transfers Sit to Stand;Stand to Sit    Sit to Stand 6: Modified independent (Device/Increase time)    Stand to Sit 6: Modified independent (Device/Increase time)      Ambulation/Gait  Ambulation/Gait Yes    Ambulation/Gait Assistance 5: Supervision    Ambulation/Gait Assistance Details reminder cues for abdominal bracing with gait/upright posture and for reciprocal arm swing with gait. no balance issues noted.    Ambulation Distance (Feet) 535 Feet   x1, plus around gym with session   Assistive device None    Gait Pattern Antalgic;Decreased trunk rotation;Wide base of support;Decreased stride length;Decreased arm swing - left;Step-through pattern;Decreased arm swing - right      Self-Care   Self-Care Other Self-Care Comments    Other Self-Care Comments  reviewed/discussed current HEP. Pt reports no issues and that they continue to be challenging.      Lumbar Exercises: Stretches   Single Knee to Chest Stretch Right;Left;2 reps;30 seconds;Limitations    Single Knee to Chest Stretch Limitations use of towel to pull knees in for stretching so not to increase UE pain    Lower Trunk Rotation 2 reps;30 seconds;Limitations    Lower Trunk Rotation Limitations performed toward both sides for 2 reps each    Other Lumbar Stretch Exercise seated at edge of mat- hamstring stretch for 30 sec's x 2 reps on each side with cues on form/technique      Lumbar Exercises: Supine   Bridge Non-compliant;10 reps;Limitations   2 sets   Bridge Limitations with yoga block squeeze between thighs with each rep, cues for abdominal bracing and increased pelvic lifting with each rep.    Other Supine Lumbar Exercises with posterior pelvic tilt- alternating slow marching for 2 sets of 10 reps      Knee/Hip Exercises: Aerobic   Nustep Level 4.0 with LE's only  for 8 minutes with goal 70-80 steps per minute for strenghtening and range of motion (have pt working on full LE extensions) concurrent with moist hot pack to lower back.      Moist Heat Therapy   Number Minutes Moist Heat 8 Minutes   concurrent with Nustep   Moist Heat Location Lumbar Spine                      PT Short Term Goals - 05/21/20 1034       PT SHORT TERM GOAL #1   Title patient to demo HEP back to PT w/o need of VCs    Baseline 05/21/20: met with current HEP    Time --    Period --    Status Achieved    Target Date 05/25/20      PT SHORT TERM GOAL #2   Title Assess BERG test and stablish goal    Baseline 04/29/20 BERG score 49/56, goal is 52    Time 4    Period Weeks    Status On-going    Target Date 05/25/20      PT SHORT TERM GOAL #3   Title Assess FGA/DGI and establish goals    Baseline 04/29/20 FGA score 19/30, goal is 24    Time 4    Period Weeks    Status On-going    Target Date 05/25/20      PT SHORT TERM GOAL #4   Title Patient to ambulate 528ft across level ground with LRAD under S    Baseline 05/21/20: met in session today    Time --    Period --    Status Achieved    Target Date 05/25/20               PT Long Term Goals - 04/27/20 1352  PT LONG TERM GOAL #1   Title Improve TUG score from 14.9 secs to </= 13.5 secs without device to decr. fall risk.    Baseline 14.9s w/o AD    Time 8    Period Weeks    Status New    Target Date 06/22/20      PT LONG TERM GOAL #2   Title Assess progress towards BERG goal    Baseline not set    Time 8    Period Weeks    Status New    Target Date 06/22/20      PT LONG TERM GOAL #3   Title Assess progress towards FGA/DGI    Baseline not set    Time 8    Period Weeks    Status New    Target Date 06/22/20      PT LONG TERM GOAL #4   Title Patient to ambulat 1061ft across all surfaces with LRAD under S    Baseline 54ft across level ground w/o AD    Time 8    Period Weeks     Status New    Target Date 06/22/20                   Plan - 05/21/20 1020     Clinical Impression Statement Today's skilled session continued to address stretching and strengthening for pain reduction with pt reporting pain 4/10 at end of session. Also began to check progress toward STGs with pt meeting his HEP and gait goals today. Will plan to check remaining STGs next session. The pt is making steady progress and should benefit from continued PT to progress toward unmet goals.    Personal Factors and Comorbidities Comorbidity 2;Time since onset of injury/illness/exacerbation    Comorbidities CVA, chronic LBP    Examination-Activity Limitations Locomotion Level    Stability/Clinical Decision Making Stable/Uncomplicated    Rehab Potential Good    PT Frequency 2x / week    PT Treatment/Interventions ADLs/Self Care Home Management;Aquatic Therapy;DME Instruction;Gait training;Stair training;Functional mobility training;Therapeutic activities;Therapeutic exercise;Balance training;Neuromuscular re-education;Patient/family education;Orthotic Fit/Training    PT Next Visit Plan check remaing STGs, need to set LTGs for Berg Balance test and DGI. continue to address pelvic alingment, core/LE strengthening and stretching    PT Home Exercise Plan [email or web address]    Consulted and Agree with Plan of Care Patient             Patient will benefit from skilled therapeutic intervention in order to improve the following deficits and impairments:  Abnormal gait,Decreased range of motion,Difficulty walking,Decreased endurance,Decreased activity tolerance,Decreased balance,Decreased mobility,Decreased strength  Visit Diagnosis: Unsteadiness on feet  Muscle weakness (generalized)  Other abnormalities of gait and mobility  Sciatica, left side     Problem List Patient Active Problem List   Diagnosis Date Noted   Lumbar spinal stenosis 08/15/2019   Nonspecific chest pain 08/11/2019   Shortness  of breath    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 ([place]) 10/21/2018   Nocturnal hypoxemia 10/21/2018   Radiation therapy complication 19/37/9024   Primary prostate cancer ([place]) 07/31/2017   Anemia 07/31/2017   Vertigo due to cerebrovascular disease 07/31/2017   Poor compliance with CPAP treatment 07/31/2017   Absolute anemia 05/16/2017   Avitaminosis D 05/16/2017   Benign essential HTN 05/16/2017   Benign prostatic hypertrophy without urinary obstruction 05/16/2017   Clinical depression 05/16/2017   CN (constipation) 05/16/2017   Current drug use 05/16/2017   Diabetic neuropathy ([place]) 05/16/2017   Genital herpes 05/16/2017  Infarction of lung due to iatrogenic pulmonary embolism ([place]) 05/16/2017   Sciatica associated with disorder of lumbar spine 05/16/2017   Arteriosclerosis of coronary artery 05/16/2017   Artery disease, cerebral 05/16/2017   Apnea, sleep 05/16/2017   Arthralgia of hip or thigh 05/16/2017   Malignant neoplasm of prostate ([place]) 09/28/2016   Vascular dementia in remission ([place]) 09/28/2016   Remote history of stroke 09/28/2016   Acute kidney injury ([place]) 05/18/2016   Dehydration 05/18/2016   Near syncope 05/18/2016   Orthostatic hypotension 05/18/2016   CKD (chronic kidney disease), stage II 04/26/2016   UTI (urinary tract infection) 04/26/2016   Encounter for counseling on use of CPAP 11/18/2015   Chronic cholecystitis with calculus 11/02/2015   Stroke, 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[place]) 04/22/2015   TIA (transient ischemic attack) 11/03/2014   OSA on CPAP 05/18/2014   White matter disease 08/14/2013   OSA (obstructive sleep apnea) 08/14/2013   Abnormal x-ray of temporomandibular joint 05/30/2013   Chronic infection of sinus 05/30/2013   Cough 05/30/2013   Fatigue 05/30/2013   Unsteady gait 05/19/2013   Combined fat and carbohydrate induced hyperlipemia 04/24/2013   Syncope 03/20/2013   Angina pectoris ([place]) 10/08/2012   Hypertension    Stroke  ([place])    History of TIAs    Lumbago    Other and unspecified hyperlipidemia    Personal history of unspecified circulatory disease    Unspecified fall    Pain in joint, multiple sites    Degeneration of cervical intervertebral disc    Unspecified cardiovascular disease    History of pulmonary embolism: June 2014,  Takes Xarelto 07/01/2012    Class: History of   Hemiparesis ([place]) 07/18/2011   Diabetes mellitus type 2 with complications ([place]) 12/24/2444   HTN (hypertension) 07/18/2011   CAD in native artery 07/18/2011   History of recurrent TIAs 07/18/2011  PHYSICAL THERAPY DISCHARGE SUMMARY  Visits from Start of Care: 9  Current functional level related to goals / functional outcomes: Improved but continued pain, soft tissue restrictions and gait disorder   Remaining deficits: Pain and soft tissue restrictions   Education / Equipment: HEP, no AD required for mobility   Patient agrees to discharge. Patient goals were partially met. Patient is being discharged due to  hospitaliztion.   Willow Ora, PTA, [facility] [address] [place], [place] [address] [phone] 05/21/20, 12:55 PM   Name: Steve Brown MRN: [id number] Date of Birth: December 12, 1940

## 2020-05-24 ENCOUNTER — Encounter (HOSPITAL_BASED_OUTPATIENT_CLINIC_OR_DEPARTMENT_OTHER): Payer: Self-pay

## 2020-05-24 ENCOUNTER — Emergency Department (HOSPITAL_BASED_OUTPATIENT_CLINIC_OR_DEPARTMENT_OTHER)
Admission: EM | Admit: 2020-05-24 | Discharge: 2020-05-24 | Disposition: A | Discharge status: Home / Self Care | Payer: Medicare Other | Attending: Emergency Medicine | Admitting: Emergency Medicine

## 2020-05-24 ENCOUNTER — Emergency Department (HOSPITAL_BASED_OUTPATIENT_CLINIC_OR_DEPARTMENT_OTHER): Payer: Medicare Other

## 2020-05-24 ENCOUNTER — Other Ambulatory Visit: Payer: Self-pay

## 2020-05-24 DIAGNOSIS — E1140 Type 2 diabetes mellitus with diabetic neuropathy, unspecified: Secondary | ICD-10-CM | POA: Diagnosis not present

## 2020-05-24 DIAGNOSIS — E1122 Type 2 diabetes mellitus with diabetic chronic kidney disease: Secondary | ICD-10-CM | POA: Insufficient documentation

## 2020-05-24 DIAGNOSIS — R1031 Right lower quadrant pain: Secondary | ICD-10-CM | POA: Insufficient documentation

## 2020-05-24 DIAGNOSIS — F0150 Vascular dementia without behavioral disturbance: Secondary | ICD-10-CM | POA: Diagnosis not present

## 2020-05-24 DIAGNOSIS — N323 Diverticulum of bladder: Secondary | ICD-10-CM | POA: Diagnosis not present

## 2020-05-24 DIAGNOSIS — Z7901 Long term (current) use of anticoagulants: Secondary | ICD-10-CM | POA: Insufficient documentation

## 2020-05-24 DIAGNOSIS — N182 Chronic kidney disease, stage 2 (mild): Secondary | ICD-10-CM | POA: Insufficient documentation

## 2020-05-24 DIAGNOSIS — M545 Low back pain, unspecified: Secondary | ICD-10-CM | POA: Diagnosis not present

## 2020-05-24 DIAGNOSIS — Z794 Long term (current) use of insulin: Secondary | ICD-10-CM | POA: Diagnosis not present

## 2020-05-24 DIAGNOSIS — M5441 Lumbago with sciatica, right side: Secondary | ICD-10-CM | POA: Insufficient documentation

## 2020-05-24 DIAGNOSIS — I129 Hypertensive chronic kidney disease with stage 1 through stage 4 chronic kidney disease, or unspecified chronic kidney disease: Secondary | ICD-10-CM | POA: Insufficient documentation

## 2020-05-24 DIAGNOSIS — Z79899 Other long term (current) drug therapy: Secondary | ICD-10-CM | POA: Diagnosis not present

## 2020-05-24 DIAGNOSIS — N281 Cyst of kidney, acquired: Secondary | ICD-10-CM | POA: Diagnosis not present

## 2020-05-24 DIAGNOSIS — Z8546 Personal history of malignant neoplasm of prostate: Secondary | ICD-10-CM | POA: Diagnosis not present

## 2020-05-24 DIAGNOSIS — N433 Hydrocele, unspecified: Secondary | ICD-10-CM | POA: Diagnosis not present

## 2020-05-24 DIAGNOSIS — I2510 Atherosclerotic heart disease of native coronary artery without angina pectoris: Secondary | ICD-10-CM | POA: Insufficient documentation

## 2020-05-24 LAB — CBC WITH DIFFERENTIAL/PLATELET
Abs Immature Granulocytes: 0.02 10*3/uL (ref 0.00–0.07)
Basophils Absolute: 0 10*3/uL (ref 0.0–0.1)
Basophils Relative: 0 %
Eosinophils Absolute: 0.1 10*3/uL (ref 0.0–0.5)
Eosinophils Relative: 1 %
HCT: 42.9 % (ref 39.0–52.0)
Hemoglobin: 14.5 g/dL (ref 13.0–17.0)
Immature Granulocytes: 0 %
Lymphocytes Relative: 28 %
Lymphs Abs: 2.2 10*3/uL (ref 0.7–4.0)
MCH: 31.4 pg (ref 26.0–34.0)
MCHC: 33.8 g/dL (ref 30.0–36.0)
MCV: 92.9 fL (ref 80.0–100.0)
Monocytes Absolute: 1.1 10*3/uL — ABNORMAL HIGH (ref 0.1–1.0)
Monocytes Relative: 14 %
Neutro Abs: 4.3 10*3/uL (ref 1.7–7.7)
Neutrophils Relative %: 57 %
Platelets: 185 10*3/uL (ref 150–400)
RBC: 4.62 MIL/uL (ref 4.22–5.81)
RDW: 13.1 % (ref 11.5–15.5)
WBC: 7.7 10*3/uL (ref 4.0–10.5)
nRBC: 0 % (ref 0.0–0.2)

## 2020-05-24 LAB — COMPREHENSIVE METABOLIC PANEL
ALT: 25 U/L (ref 0–44)
AST: 31 U/L (ref 15–41)
Albumin: 4.1 g/dL (ref 3.5–5.0)
Alkaline Phosphatase: 72 U/L (ref 38–126)
Anion gap: 8 (ref 5–15)
BUN: 14 mg/dL (ref 8–23)
CO2: 28 mmol/L (ref 22–32)
Calcium: 9.1 mg/dL (ref 8.9–10.3)
Chloride: 102 mmol/L (ref 98–111)
Creatinine, Ser: 1.22 mg/dL (ref 0.61–1.24)
GFR, Estimated: >60 mL/min (ref >60)
Glucose, Bld: 67 mg/dL — ABNORMAL LOW (ref 70–99)
Potassium: 4.5 mmol/L (ref 3.5–5.1)
Sodium: 138 mmol/L (ref 135–145)
Total Bilirubin: 0.8 mg/dL (ref 0.3–1.2)
Total Protein: 8 g/dL (ref 6.5–8.1)

## 2020-05-24 LAB — URINALYSIS, ROUTINE W REFLEX MICROSCOPIC
Bilirubin Urine: NEGATIVE
Glucose, UA: NEGATIVE mg/dL
Ketones, ur: NEGATIVE mg/dL
Nitrite: NEGATIVE
Protein, ur: NEGATIVE mg/dL
Specific Gravity, Urine: <1.005 — ABNORMAL LOW (ref 1.005–1.030)
pH: 6.5 (ref 5.0–8.0)

## 2020-05-24 LAB — URINALYSIS, MICROSCOPIC (REFLEX): WBC, UA: >50 WBC/hpf (ref 0–5)

## 2020-05-24 LAB — CBG MONITORING, ED: Glucose-Capillary: 85 mg/dL (ref 70–99)

## 2020-05-24 MED ORDER — OXYCODONE-ACETAMINOPHEN 5-325 MG PO TABS
1.0000 | ORAL_TABLET | Freq: Once | ORAL | Status: AC
  Administered 2020-05-24: 1 via ORAL
  Filled 2020-05-24: qty 1

## 2020-05-24 MED ORDER — TIZANIDINE HCL 2 MG PO CAPS: 2.0000 mg | ORAL_CAPSULE | Freq: Three times a day (TID) | ORAL | 0 refills | Status: DC

## 2020-05-24 MED ORDER — CEPHALEXIN 500 MG PO CAPS: 500.0000 mg | ORAL_CAPSULE | Freq: Two times a day (BID) | ORAL | 0 refills | Status: DC

## 2020-05-24 NOTE — ED Notes (Signed)
Patient transported to CT 

## 2020-05-24 NOTE — Discharge Instructions (Addendum)
Work-up is reassuring.  Your urine does show possible signs of infection.  Take antibiotics as prescribed.  Also give you muscle relaxer to take.  This can make you drowsy to be careful taking it.  Continue Tylenol at home for pain as well.  You can apply topical lidocaine patches to your back along with warm compresses.  Make sure you follow-up with a primary care doctor.  If you have any fevers, worsening pain, inability to walk return to the ER.

## 2020-05-24 NOTE — ED Triage Notes (Signed)
Lower back that radiates to R lower abd.  Denies any urinary sxs but does endorse some chronic back pain

## 2020-05-24 NOTE — ED Provider Notes (Signed)
[facility] EMERGENCY DEPARTMENT Provider Note   CSN: [id number] Arrival date & time: 05/24/20  1132     History Chief Complaint  Patient presents with  . Back Pain    Steve Brown is a 80 y.o. male.  HPI 80 year old male presents to the emergency department today for evaluation of low back pain.  Patient reports that he is been having low back pain for the past several months.  He is followed by physical therapy which she is currently undergoing for his low back pain.  Patient states that the pain became unbearable yesterday.  Not controlled with Tylenol at home.  Patient reports his pain radiates to his right groin.  No urinary symptoms.  No nausea or vomiting.  Patient does live at home by himself.  Ambulates with a cane at baseline.  Patient denies any new trauma to the area.  Denies any nausea, vomiting.  No loss of bowel or bladder, saddle paresthesias or urinary retention.    Past Medical History:  Diagnosis Date  . Abnormal prostate biopsy   . Anticoagulant long-term use    currently xarelto  . BPH with elevated PSA   . CKD (chronic kidney disease), stage II   . Complication of anesthesia    limted neck rom limited use of left arm due to cva  . Coronary artery disease    CARDIOLOGIST-  DR Irish Lack--  2010-- PCI w/ stenting midLAD  . DDD (degenerative disc disease), lumbar   . Degeneration of cervical intervertebral disc   . Depression   . Dyspnea on exertion   . GERD (gastroesophageal reflux disease)   . Hemiparesis due to cerebral infarction   . History of cerebrovascular accident (CVA) with residual deficit 2002 and 2003--  hemiparisis both sides   per MRI  anterior left frontal lobe, left para midline pons, and inferior cerebullam bilaterally infarcts  . History of pulmonary embolus (PE)    06-30-2012  extensive bilaterally  . History of recurrent TIAs   . History of syncope    hx multiple pre-syncope and syncopal episodes due to vasovagal,  orthostatic hypotension, dehydration  . History of TIAs    several since 2002  . Hyperlipidemia   . Hypertension   . Mild atherosclerosis of carotid artery, bilateral    per last duplex 11-04-2014  bilateral ICA 1--39%  . Neuropathy    fingers  . OSA on CPAP    followed by dr dohmeier--  sev. osa w/ AHI 65.9  . Prostate cancer ([place]) dx 2018  . Renal insufficiency   . S/P coronary artery stent placement 2010   stenting to mid LAD  . Simple renal cyst    bilaterally  . Stroke ([place])   . Trigger finger of both hands 11-17-13  . Type 2 diabetes mellitus ([place]) dx 1986   last one A1c 9.2 on 04-26-2016  . Unsteady gait    . Hx prior CVA/TIAs;  . Vertebral artery occlusion, left    chronic    Patient Active Problem List   Diagnosis Date Noted  . Lumbar spinal stenosis 08/15/2019  . Nonspecific chest pain 08/11/2019  . Shortness of breath   . 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 ([place]) 10/21/2018  . Nocturnal hypoxemia 10/21/2018  . Radiation therapy complication 93/81/0175  . Primary prostate cancer ([place]) 07/31/2017  . Anemia 07/31/2017  . Vertigo due to cerebrovascular disease 07/31/2017  . Poor compliance with CPAP treatment 07/31/2017  . Absolute anemia 05/16/2017  . Avitaminosis D 05/16/2017  .  Benign essential HTN 05/16/2017  . Benign prostatic hypertrophy without urinary obstruction 05/16/2017  . Clinical depression 05/16/2017  . CN (constipation) 05/16/2017  . Current drug use 05/16/2017  . Diabetic neuropathy (HCC) 05/16/2017  . Genital herpes 05/16/2017  . Infarction of lung due to iatrogenic pulmonary embolism (HCC) 05/16/2017  . Sciatica associated with disorder of lumbar spine 05/16/2017  . Arteriosclerosis of coronary artery 05/16/2017  . Artery disease, cerebral 05/16/2017  . Apnea, sleep 05/16/2017  . Arthralgia of hip or thigh 05/16/2017  . Malignant neoplasm of prostate (HCC) 09/28/2016  . Vascular dementia in remission (HCC) 09/28/2016   . Remote history of stroke 09/28/2016  . Acute kidney injury (HCC) 05/18/2016  . Dehydration 05/18/2016  . Near syncope 05/18/2016  . Orthostatic hypotension 05/18/2016  . CKD (chronic kidney disease), stage II 04/26/2016  . UTI (urinary tract infection) 04/26/2016  . Encounter for counseling on use of CPAP 11/18/2015  . Chronic cholecystitis with calculus 11/02/2015  . Stroke, 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HCC) 04/22/2015  . TIA (transient ischemic attack) 11/03/2014  . OSA on CPAP 05/18/2014  . White matter disease 08/14/2013  . OSA (obstructive sleep apnea) 08/14/2013  . Abnormal x-ray of temporomandibular joint 05/30/2013  . Chronic infection of sinus 05/30/2013  . Cough 05/30/2013  . Fatigue 05/30/2013  . Unsteady gait 05/19/2013  . Combined fat and carbohydrate induced hyperlipemia 04/24/2013  . Syncope 03/20/2013  . Angina pectoris (HCC) 10/08/2012  . Hypertension   . Stroke (HCC)   . History of TIAs   . Lumbago   . Other and unspecified hyperlipidemia   . Personal history of unspecified circulatory disease   . Unspecified fall   . Pain in joint, multiple sites   . Degeneration of cervical intervertebral disc   . Unspecified cardiovascular disease   . History of pulmonary embolism: June 2014,  Takes Xarelto 07/01/2012    Class: History of  . Hemiparesis (HCC) 07/18/2011  . Diabetes mellitus type 2 with complications (HCC) 07/18/2011  . HTN (hypertension) 07/18/2011  . CAD in native artery 07/18/2011  . History of recurrent TIAs 07/18/2011    Past Surgical History:  Procedure Laterality Date  . ANTERIOR CERVICAL DECOMP/DISCECTOMY FUSION  2004   C3 -- C6 limited rom  . CARDIAC CATHETERIZATION  06-10-2010   dr Eldridge Dace   wide patent LAD stent, mid lesion at the origin of the septal prior to the previous stent 40-50%/  normal LVF, ef 55%  . CARDIOVASCULAR STRESS TEST  10-23-2012  dr Eldridge Dace   normal nuclear perfusion study w/ no ischemia/  normal LV function and wall motion  , ef 65%  . CARPAL TUNNEL RELEASE Bilateral   . CATARACT EXTRACTION W/ INTRAOCULAR LENS  IMPLANT, BILATERAL    . CHOLECYSTECTOMY N/A 11/02/2015   Procedure: LAPAROSCOPIC CHOLECYSTECTOMY WITH INTRAOPERATIVE CHOLANGIOGRAM;  Surgeon: Manus Rudd, MD;  Location: [facility] OR;  Service: General;  Laterality: N/A;  . COLONOSCOPY    . CORONARY ANGIOPLASTY WITH STENT PLACEMENT  02/2008   stenting to mid LAD  . GOLD SEED IMPLANT N/A 11/15/2016   Procedure: GOLD SEED IMPLANT TIMES THREE;  Surgeon: Crist Fat, MD;  Location: [facility];  Service: Urology;  Laterality: N/A;  . IR ANGIO INTRA EXTRACRAN SEL COM CAROTID INNOMINATE BILAT MOD SED  06/13/2018  . IR ANGIO VERTEBRAL SEL VERTEBRAL UNI R MOD SED  06/13/2018  . IR US GUIDE VASC ACCESS RIGHT  06/13/2018  . LEFT HEART CATH AND CORONARY ANGIOGRAPHY N/A 05/25/2017  Procedure: LEFT HEART CATH AND CORONARY ANGIOGRAPHY;  Surgeon: Jettie Booze, MD;  Location: [facility] CV LAB;  Service: Cardiovascular;  Laterality: N/A;  . LEFT HEART CATHETERIZATION WITH CORONARY ANGIOGRAM N/A 04/03/2013   Procedure: LEFT HEART CATHETERIZATION WITH CORONARY ANGIOGRAM;  Surgeon: Jettie Booze, MD;  Location: [facility] CATH LAB;  Service: Cardiovascular;  Laterality: N/A;  patent mLAD stent  w/ mild disease in remainder LAD and its branches;  mod. focal lesion midLCFx- FFR of lesion was negative for ischemia/  normal LVSF, ef 50%  . lungs  2005   "fluid pumped off lungs"  . NEUROPLASTY / TRANSPOSITION ULNAR NERVE AT ELBOW Right 2004  . PROSTATE BIOPSY N/A 08/31/2016   Procedure: PROSTATE  BIOPSY TRANSRECTAL ULTRASONIC PROSTATE (TUBP);  Surgeon: Ardis Hughs, MD;  Location: [facility];  Service: Urology;  Laterality: N/A;  . SPACE OAR INSTILLATION N/A 11/15/2016   Procedure: SPACE OAR INSTILLATION;  Surgeon: Ardis Hughs, MD;  Location: [facility];  Service: Urology;  Laterality: N/A;  . TRANSTHORACIC  ECHOCARDIOGRAM  04/27/2016   severe focal basal LVH, ef 60-65%,  grade 2 diastoilc dysfunction/  mild AR, MR, and TR/  atrial septum lipomatous hypertrophy/  PASP 48mmHg  . UMBILICAL HERNIA REPAIR         Family History  Problem Relation Age of Onset  . Aneurysm Mother   . Cancer Father        unknown either pancreatic or prostate  . Stroke Brother   . Dementia Sister   . Heart attack Neg Hx     Social History   Tobacco Use  . Smoking status: Never Smoker  . Smokeless tobacco: Never Used  Vaping Use  . Vaping Use: Never used  Substance Use Topics  . Alcohol use: No    Alcohol/week: 0.0 standard drinks  . Drug use: No    Home Medications Prior to Admission medications   Medication Sig Start Date End Date Taking? Authorizing Provider  acetaminophen (TYLENOL) 500 MG tablet Take 1,000 mg by mouth every 6 (six) hours as needed (pain).     [provider]  Ascorbic Acid (VITAMIN C PO) Take 1 tablet by mouth daily.    [provider]  cholecalciferol (VITAMIN D) 1000 UNITS tablet Take 1,000 Units by mouth daily.    [provider]  diclofenac Sodium (VOLTAREN) 1 % GEL Apply 4 g topically 4 (four) times daily. 09/03/19   Deno Etienne, DO  Echinacea 450 MG CAPS Take 450 mg by mouth daily.     [provider]  fluticasone (FLONASE) 50 MCG/ACT nasal spray Place 1 spray into both nostrils 2 (two) times daily as needed for allergies.  09/26/16   [provider]  furosemide (LASIX) 20 MG tablet Take 1-2 tablets daily as needed for swelling. Patient taking differently: Take 20 mg by mouth daily. 08/08/19   Jettie Booze, MD  insulin lispro (HUMALOG) 100 UNIT/ML KiwkPen Inject 6-10 Units into the skin 3 (three) times daily with meals.    [provider]  JANUVIA 50 MG tablet Take 50 mg by mouth daily. 08/22/19   [provider]  losartan (COZAAR) 100 MG tablet Take 100 mg by mouth daily. 03/29/18   [provider]   modafinil (PROVIGIL) 100 MG tablet Take 1 tablet (100 mg total) by mouth daily. 12/01/19   Ward Givens, NP  Multiple Vitamin (MULTIVITAMIN WITH MINERALS) TABS tablet Take 1 tablet by mouth daily.  [provider]  nitroGLYCERIN (NITROSTAT) 0.4 MG SL tablet Place 1 tablet (0.4 mg total) under the tongue every 5 (five) minutes as needed for chest pain. 12/07/15   Jettie Booze, MD  pregabalin (LYRICA) 300 MG capsule Take 1 capsule (300 mg total) by mouth 2 (two) times daily. 07/26/12   Dohmeier, Asencion Partridge, MD  rivaroxaban (XARELTO) 20 MG TABS tablet Take 20 mg by mouth daily.     [provider]  rosuvastatin (CRESTOR) 10 MG tablet Take 1 tablet (10 mg total) by mouth daily. 08/22/19   Jettie Booze, MD  TRESIBA FLEXTOUCH 100 UNIT/ML SOPN FlexTouch Pen Inject 26 Units into the skin every morning.  05/11/18   [provider]  valACYclovir (VALTREX) 500 MG tablet Take 500 mg by mouth daily.    [provider]  zinc gluconate 50 MG tablet Take 50 mg by mouth daily.    [provider]    Allergies    Other, Phenergan [promethazine], Iohexol, and Tramadol  Review of Systems   Review of Systems  Constitutional: Negative for chills and fever.  HENT: Negative for congestion.   Eyes: Negative for discharge.  Gastrointestinal: Positive for abdominal pain. Negative for diarrhea, nausea and vomiting.  Genitourinary: Negative for difficulty urinating, enuresis, flank pain, frequency, hematuria and urgency.  Musculoskeletal: Positive for back pain.  Skin: Negative for color change.  Neurological: Negative for headaches.  Psychiatric/Behavioral: Negative for confusion.    Physical Exam Updated Vital Signs BP (!) 151/76 (BP Location: Right Arm)   Pulse 62   Temp 98.5 F (36.9 C) (Oral)   Resp 18   Ht 5\' 5"  (1.651 m)   Wt 94.3 kg   SpO2 100%   BMI 34.61 kg/m   Physical Exam Vitals and nursing note reviewed.  Constitutional:       General: He is not in acute distress.    Appearance: He is well-developed.  HENT:     Head: Normocephalic and atraumatic.  Eyes:     General: No scleral icterus.       Right eye: No discharge.        Left eye: No discharge.  Cardiovascular:     Rate and Rhythm: Normal rate and regular rhythm.     Pulses: Normal pulses.  Pulmonary:     Effort: No respiratory distress.  Abdominal:     General: Abdomen is flat. Bowel sounds are normal. There is no distension.     Palpations: Abdomen is soft. There is no mass.     Tenderness: There is no abdominal tenderness. There is no right CVA tenderness, left CVA tenderness, guarding or rebound.     Hernia: No hernia is present.  Musculoskeletal:        General: Normal range of motion.     Cervical back: Normal range of motion.     Comments: Right lumbar paraspinal pain to palpation,   Skin:    General: Skin is warm and dry.     Capillary Refill: Capillary refill takes less than 2 seconds.     Coloration: Skin is not pale.  Neurological:     Mental Status: He is alert.     Sensory: No sensory deficit.     Motor: No weakness.  Psychiatric:        Mood and Affect: Mood normal.        Behavior: Behavior normal.        Thought Content: Thought content normal.  Judgment: Judgment normal.     ED Results / Procedures / Treatments   Labs (all labs ordered are listed, but only abnormal results are displayed) Labs Reviewed  URINALYSIS, ROUTINE W REFLEX MICROSCOPIC  CBC WITH DIFFERENTIAL/PLATELET  COMPREHENSIVE METABOLIC PANEL    EKG None  Radiology CT Renal Stone Study  Result Date: 05/24/2020 CLINICAL DATA:  80 year old male with right-sided low back and flank pain. Suspected nephrolithiasis. EXAM: CT ABDOMEN AND PELVIS WITHOUT CONTRAST TECHNIQUE: Multidetector CT imaging of the abdomen and pelvis was performed following the standard protocol without IV contrast. COMPARISON:  12/26/2019 FINDINGS: Lower chest: No acute abnormality.  Hepatobiliary: The liver is normal in size, contour, and attenuation. Unchanged 1.4 cm hypoattenuating mass about the subcapsular segment 8, again favored to likely represent benign hemangioma. The gallbladder surgically absent. No intra or extrahepatic biliary ductal dilation. Pancreas: Unremarkable. No pancreatic ductal dilatation or surrounding inflammatory changes. Spleen: Normal in size without focal abnormality. Adrenals/Urinary Tract: Adrenal glands are unremarkable. Left interpolar simple cortical cyst measuring up to 16 mm. Kidneys are normal, without renal calculi, focal lesion, or hydronephrosis. No bladder wall thickening. Unchanged left bladder wall diverticulum. Stomach/Bowel: Stomach is within normal limits. Appendix appears normal. No evidence of bowel wall thickening, distention, or inflammatory changes. Vascular/Lymphatic: Aortic atherosclerosis. No enlarged abdominal or pelvic lymph nodes. Reproductive: Prostate is unremarkable. Right greater than left bilateral hydrocele. Other: No abdominal wall hernia or abnormality. No abdominopelvic ascites. Musculoskeletal: Multilevel degenerative changes of the thoracolumbar spine. No acute osseous abnormality or aggressive appearing osseous lesion. IMPRESSION: No acute abdominopelvic abnormality, nephrolithiasis, or hydronephrosis. Electronically Signed   By: Ruthann Cancer MD   On: 05/24/2020 14:58    Procedures Procedures   Medications Ordered in ED Medications  oxyCODONE-acetaminophen (PERCOCET/ROXICET) 5-325 MG per tablet 1 tablet (has no administration in time range)    ED Course  I have reviewed the triage vital signs and the nursing notes.  Pertinent labs & imaging results that were available during my care of the patient were reviewed by me and considered in my medical decision making (see chart for details).    MDM Rules/Calculators/A&P                          80 year old presents with acute on chronic low back pain.  Patient  also reports some right lower quadrant abdominal pain.  Vital signs are at patient's baseline.  No red flag symptoms concerning for cauda equina.  Labs imaging were reviewed.  No leukocytosis.  No significant electrolyte derangement.  Glucose is 67 however patient was given p.o. fluids and patient's blood sugar improved to normal.  Patient is UA does show concern for infection however patient denies urinary symptoms.  Urine culture is pending.  CT performed shows no acute findings.  Chronic findings documented above that have been seen on prior imaging.  No signs of obstructive uropathy.  Doubt pyelonephritis however given concern for lower abdominal pain will treat for UTI with antibiotics.  Patient able to ambulate in the ER.  Will provide short course of muscle relaxers as the back pain is likely secondary to his chronic low back pain.  Will need primary care follow-up in outpatient setting.  No indication for emergent intervention or admission at this time.  Pt is hemodynamically stable, in NAD, & able to ambulate in the ED. Evaluation does not show pathology that would require ongoing emergent intervention or inpatient treatment. I explained the diagnosis to the patient. Pain  has been managed & has no complaints prior to dc. Pt is comfortable with above plan and is stable for discharge at this time. All questions were answered prior to disposition. Strict return precautions for f/u to the ED were discussed. Encouraged follow up with PCP.  Final Clinical Impression(s) / ED Diagnoses Final diagnoses:  Acute right-sided low back pain with right-sided sciatica    Rx / DC Orders ED Discharge Orders         Ordered    cephALEXin (KEFLEX) 500 MG capsule  2 times daily        05/24/20 1607    tizanidine (ZANAFLEX) 2 MG capsule  3 times daily        05/24/20 1607           Aaron Edelman 05/24/20 1746    Hayden Rasmussen, MD 05/24/20 1914

## 2020-05-25 ENCOUNTER — Emergency Department (HOSPITAL_COMMUNITY): Payer: Medicare Other

## 2020-05-25 ENCOUNTER — Encounter (HOSPITAL_COMMUNITY): Payer: Self-pay | Admitting: Emergency Medicine

## 2020-05-25 ENCOUNTER — Other Ambulatory Visit: Payer: Self-pay

## 2020-05-25 ENCOUNTER — Ambulatory Visit: Payer: Medicare Other

## 2020-05-25 ENCOUNTER — Inpatient Hospital Stay (HOSPITAL_COMMUNITY)
Admission: EM | Admit: 2020-05-25 | Discharge: 2020-05-31 | DRG: 562 | Disposition: A | Discharge status: Skilled Nursing Facility | Payer: Medicare Other | Attending: Internal Medicine | Admitting: Internal Medicine

## 2020-05-25 DIAGNOSIS — Z8673 Personal history of transient ischemic attack (TIA), and cerebral infarction without residual deficits: Secondary | ICD-10-CM | POA: Diagnosis not present

## 2020-05-25 DIAGNOSIS — R17 Unspecified jaundice: Secondary | ICD-10-CM | POA: Diagnosis present

## 2020-05-25 DIAGNOSIS — R778 Other specified abnormalities of plasma proteins: Secondary | ICD-10-CM

## 2020-05-25 DIAGNOSIS — Z7984 Long term (current) use of oral hypoglycemic drugs: Secondary | ICD-10-CM

## 2020-05-25 DIAGNOSIS — Z79899 Other long term (current) drug therapy: Secondary | ICD-10-CM

## 2020-05-25 DIAGNOSIS — G8929 Other chronic pain: Secondary | ICD-10-CM | POA: Diagnosis not present

## 2020-05-25 DIAGNOSIS — M25552 Pain in left hip: Secondary | ICD-10-CM | POA: Diagnosis not present

## 2020-05-25 DIAGNOSIS — W19XXXA Unspecified fall, initial encounter: Secondary | ICD-10-CM | POA: Diagnosis present

## 2020-05-25 DIAGNOSIS — N390 Urinary tract infection, site not specified: Secondary | ICD-10-CM | POA: Diagnosis not present

## 2020-05-25 DIAGNOSIS — M5116 Intervertebral disc disorders with radiculopathy, lumbar region: Secondary | ICD-10-CM | POA: Diagnosis present

## 2020-05-25 DIAGNOSIS — M549 Dorsalgia, unspecified: Secondary | ICD-10-CM

## 2020-05-25 DIAGNOSIS — M6281 Muscle weakness (generalized): Secondary | ICD-10-CM | POA: Diagnosis not present

## 2020-05-25 DIAGNOSIS — R5381 Other malaise: Secondary | ICD-10-CM | POA: Diagnosis not present

## 2020-05-25 DIAGNOSIS — I130 Hypertensive heart and chronic kidney disease with heart failure and stage 1 through stage 4 chronic kidney disease, or unspecified chronic kidney disease: Secondary | ICD-10-CM | POA: Diagnosis present

## 2020-05-25 DIAGNOSIS — M160 Bilateral primary osteoarthritis of hip: Secondary | ICD-10-CM | POA: Diagnosis present

## 2020-05-25 DIAGNOSIS — N179 Acute kidney failure, unspecified: Secondary | ICD-10-CM | POA: Diagnosis present

## 2020-05-25 DIAGNOSIS — S39012A Strain of muscle, fascia and tendon of lower back, initial encounter: Secondary | ICD-10-CM | POA: Diagnosis not present

## 2020-05-25 DIAGNOSIS — M4316 Spondylolisthesis, lumbar region: Secondary | ICD-10-CM | POA: Diagnosis present

## 2020-05-25 DIAGNOSIS — Z9861 Coronary angioplasty status: Secondary | ICD-10-CM | POA: Diagnosis not present

## 2020-05-25 DIAGNOSIS — I5032 Chronic diastolic (congestive) heart failure: Secondary | ICD-10-CM | POA: Diagnosis present

## 2020-05-25 DIAGNOSIS — Z9049 Acquired absence of other specified parts of digestive tract: Secondary | ICD-10-CM | POA: Diagnosis not present

## 2020-05-25 DIAGNOSIS — Z20822 Contact with and (suspected) exposure to covid-19: Secondary | ICD-10-CM | POA: Diagnosis present

## 2020-05-25 DIAGNOSIS — Z7901 Long term (current) use of anticoagulants: Secondary | ICD-10-CM | POA: Diagnosis not present

## 2020-05-25 DIAGNOSIS — Z885 Allergy status to narcotic agent status: Secondary | ICD-10-CM

## 2020-05-25 DIAGNOSIS — Z91041 Radiographic dye allergy status: Secondary | ICD-10-CM

## 2020-05-25 DIAGNOSIS — N400 Enlarged prostate without lower urinary tract symptoms: Secondary | ICD-10-CM | POA: Diagnosis not present

## 2020-05-25 DIAGNOSIS — M545 Low back pain, unspecified: Secondary | ICD-10-CM | POA: Diagnosis not present

## 2020-05-25 DIAGNOSIS — E1165 Type 2 diabetes mellitus with hyperglycemia: Secondary | ICD-10-CM | POA: Diagnosis present

## 2020-05-25 DIAGNOSIS — G4733 Obstructive sleep apnea (adult) (pediatric): Secondary | ICD-10-CM | POA: Diagnosis present

## 2020-05-25 DIAGNOSIS — R55 Syncope and collapse: Secondary | ICD-10-CM | POA: Diagnosis not present

## 2020-05-25 DIAGNOSIS — N281 Cyst of kidney, acquired: Secondary | ICD-10-CM | POA: Diagnosis not present

## 2020-05-25 DIAGNOSIS — M48061 Spinal stenosis, lumbar region without neurogenic claudication: Secondary | ICD-10-CM | POA: Diagnosis present

## 2020-05-25 DIAGNOSIS — E785 Hyperlipidemia, unspecified: Secondary | ICD-10-CM | POA: Diagnosis present

## 2020-05-25 DIAGNOSIS — K219 Gastro-esophageal reflux disease without esophagitis: Secondary | ICD-10-CM | POA: Diagnosis present

## 2020-05-25 DIAGNOSIS — E1122 Type 2 diabetes mellitus with diabetic chronic kidney disease: Secondary | ICD-10-CM | POA: Diagnosis present

## 2020-05-25 DIAGNOSIS — Z794 Long term (current) use of insulin: Secondary | ICD-10-CM

## 2020-05-25 DIAGNOSIS — M5442 Lumbago with sciatica, left side: Secondary | ICD-10-CM | POA: Diagnosis not present

## 2020-05-25 DIAGNOSIS — R140 Abdominal distension (gaseous): Secondary | ICD-10-CM | POA: Diagnosis not present

## 2020-05-25 DIAGNOSIS — G9341 Metabolic encephalopathy: Secondary | ICD-10-CM

## 2020-05-25 DIAGNOSIS — Z888 Allergy status to other drugs, medicaments and biological substances status: Secondary | ICD-10-CM

## 2020-05-25 DIAGNOSIS — E119 Type 2 diabetes mellitus without complications: Secondary | ICD-10-CM

## 2020-05-25 DIAGNOSIS — Z86711 Personal history of pulmonary embolism: Secondary | ICD-10-CM | POA: Diagnosis not present

## 2020-05-25 DIAGNOSIS — Z043 Encounter for examination and observation following other accident: Secondary | ICD-10-CM | POA: Diagnosis not present

## 2020-05-25 DIAGNOSIS — R69 Illness, unspecified: Secondary | ICD-10-CM | POA: Diagnosis not present

## 2020-05-25 DIAGNOSIS — K449 Diaphragmatic hernia without obstruction or gangrene: Secondary | ICD-10-CM | POA: Diagnosis not present

## 2020-05-25 DIAGNOSIS — R2681 Unsteadiness on feet: Secondary | ICD-10-CM | POA: Diagnosis not present

## 2020-05-25 DIAGNOSIS — N261 Atrophy of kidney (terminal): Secondary | ICD-10-CM | POA: Diagnosis not present

## 2020-05-25 DIAGNOSIS — M5441 Lumbago with sciatica, right side: Secondary | ICD-10-CM | POA: Diagnosis not present

## 2020-05-25 DIAGNOSIS — E118 Type 2 diabetes mellitus with unspecified complications: Secondary | ICD-10-CM | POA: Diagnosis not present

## 2020-05-25 DIAGNOSIS — I69051 Hemiplegia and hemiparesis following nontraumatic subarachnoid hemorrhage affecting right dominant side: Secondary | ICD-10-CM | POA: Diagnosis not present

## 2020-05-25 DIAGNOSIS — I2510 Atherosclerotic heart disease of native coronary artery without angina pectoris: Secondary | ICD-10-CM | POA: Diagnosis present

## 2020-05-25 DIAGNOSIS — R319 Hematuria, unspecified: Secondary | ICD-10-CM | POA: Diagnosis present

## 2020-05-25 DIAGNOSIS — Z6835 Body mass index (BMI) 35.0-35.9, adult: Secondary | ICD-10-CM

## 2020-05-25 DIAGNOSIS — M47816 Spondylosis without myelopathy or radiculopathy, lumbar region: Secondary | ICD-10-CM | POA: Diagnosis not present

## 2020-05-25 DIAGNOSIS — M7989 Other specified soft tissue disorders: Secondary | ICD-10-CM | POA: Diagnosis not present

## 2020-05-25 DIAGNOSIS — N1831 Chronic kidney disease, stage 3a: Secondary | ICD-10-CM | POA: Diagnosis present

## 2020-05-25 DIAGNOSIS — R079 Chest pain, unspecified: Secondary | ICD-10-CM | POA: Diagnosis not present

## 2020-05-25 DIAGNOSIS — N433 Hydrocele, unspecified: Secondary | ICD-10-CM | POA: Diagnosis not present

## 2020-05-25 DIAGNOSIS — R41841 Cognitive communication deficit: Secondary | ICD-10-CM | POA: Diagnosis not present

## 2020-05-25 DIAGNOSIS — I878 Other specified disorders of veins: Secondary | ICD-10-CM | POA: Diagnosis not present

## 2020-05-25 DIAGNOSIS — N323 Diverticulum of bladder: Secondary | ICD-10-CM | POA: Diagnosis not present

## 2020-05-25 DIAGNOSIS — R278 Other lack of coordination: Secondary | ICD-10-CM | POA: Diagnosis not present

## 2020-05-25 HISTORY — DX: Type 2 diabetes mellitus without complications: E11.9

## 2020-05-25 LAB — URINALYSIS, ROUTINE W REFLEX MICROSCOPIC
Bilirubin Urine: NEGATIVE
Glucose, UA: >=500 mg/dL — AB
Ketones, ur: 20 mg/dL — AB
Nitrite: NEGATIVE
Protein, ur: 30 mg/dL — AB
Specific Gravity, Urine: 1.016 (ref 1.005–1.030)
WBC, UA: >50 WBC/hpf — ABNORMAL HIGH (ref 0–5)
pH: 5 (ref 5.0–8.0)

## 2020-05-25 LAB — CBC WITH DIFFERENTIAL/PLATELET
Abs Immature Granulocytes: 0.03 10*3/uL (ref 0.00–0.07)
Basophils Absolute: 0 10*3/uL (ref 0.0–0.1)
Basophils Relative: 0 %
Eosinophils Absolute: 0 10*3/uL (ref 0.0–0.5)
Eosinophils Relative: 0 %
HCT: 41.4 % (ref 39.0–52.0)
Hemoglobin: 13.9 g/dL (ref 13.0–17.0)
Immature Granulocytes: 0 %
Lymphocytes Relative: 12 %
Lymphs Abs: 1 10*3/uL (ref 0.7–4.0)
MCH: 31.5 pg (ref 26.0–34.0)
MCHC: 33.6 g/dL (ref 30.0–36.0)
MCV: 93.9 fL (ref 80.0–100.0)
Monocytes Absolute: 1.1 10*3/uL — ABNORMAL HIGH (ref 0.1–1.0)
Monocytes Relative: 12 %
Neutro Abs: 6.6 10*3/uL (ref 1.7–7.7)
Neutrophils Relative %: 76 %
Platelets: 162 10*3/uL (ref 150–400)
RBC: 4.41 MIL/uL (ref 4.22–5.81)
RDW: 12.8 % (ref 11.5–15.5)
WBC: 8.8 10*3/uL (ref 4.0–10.5)
nRBC: 0 % (ref 0.0–0.2)

## 2020-05-25 LAB — RESP PANEL BY RT-PCR (FLU A&B, COVID) ARPGX2
Influenza A by PCR: NEGATIVE
Influenza B by PCR: NEGATIVE
SARS Coronavirus 2 by RT PCR: NEGATIVE

## 2020-05-25 LAB — ETHANOL: Alcohol, Ethyl (B): <10 mg/dL (ref <10)

## 2020-05-25 LAB — COMPREHENSIVE METABOLIC PANEL
ALT: 25 U/L (ref 0–44)
AST: 25 U/L (ref 15–41)
Albumin: 3.7 g/dL (ref 3.5–5.0)
Alkaline Phosphatase: 65 U/L (ref 38–126)
Anion gap: 9 (ref 5–15)
BUN: 20 mg/dL (ref 8–23)
CO2: 25 mmol/L (ref 22–32)
Calcium: 9.2 mg/dL (ref 8.9–10.3)
Chloride: 100 mmol/L (ref 98–111)
Creatinine, Ser: 1.45 mg/dL — ABNORMAL HIGH (ref 0.61–1.24)
GFR, Estimated: 49 mL/min — ABNORMAL LOW (ref >60)
Glucose, Bld: 358 mg/dL — ABNORMAL HIGH (ref 70–99)
Potassium: 4.3 mmol/L (ref 3.5–5.1)
Sodium: 134 mmol/L — ABNORMAL LOW (ref 135–145)
Total Bilirubin: 1.7 mg/dL — ABNORMAL HIGH (ref 0.3–1.2)
Total Protein: 7.5 g/dL (ref 6.5–8.1)

## 2020-05-25 LAB — TROPONIN I (HIGH SENSITIVITY)
Troponin I (High Sensitivity): 40 ng/L — ABNORMAL HIGH (ref <18)
Troponin I (High Sensitivity): 52 ng/L — ABNORMAL HIGH (ref <18)

## 2020-05-25 MED ORDER — FENTANYL CITRATE (PF) 100 MCG/2ML IJ SOLN
40.0000 ug | INTRAMUSCULAR | Status: DC | PRN
  Administered 2020-05-25: 40 ug via INTRAVENOUS
  Filled 2020-05-25: qty 2

## 2020-05-25 MED ORDER — SODIUM CHLORIDE 0.9 % IV SOLN
1.0000 g | Freq: Once | INTRAVENOUS | Status: AC
  Administered 2020-05-25: 1 g via INTRAVENOUS
  Filled 2020-05-25: qty 10

## 2020-05-25 MED ORDER — SODIUM CHLORIDE 0.9 % IV BOLUS
500.0000 mL | Freq: Once | INTRAVENOUS | Status: AC
  Administered 2020-05-25: 500 mL via INTRAVENOUS

## 2020-05-25 MED ORDER — ACETAMINOPHEN 500 MG PO TABS
1000.0000 mg | ORAL_TABLET | Freq: Once | ORAL | Status: AC
  Administered 2020-05-25: 1000 mg via ORAL
  Filled 2020-05-25: qty 2

## 2020-05-25 NOTE — ED Notes (Signed)
Patient transported to CT 

## 2020-05-25 NOTE — ED Notes (Signed)
Patient transported to CT w/ RN. 

## 2020-05-25 NOTE — Discharge Instructions (Addendum)
Use ice, Tylenol every 4 hours as needed for pain.  Information on my medicine - XARELTO (Rivaroxaban)  Why was Xarelto prescribed for you? Xarelto was prescribed for you to reduce the risk of a blood clot forming that can cause a stroke if you have a medical condition called atrial fibrillation (a type of irregular heartbeat).  What do you need to know about xarelto ? Take your Xarelto ONCE DAILY at the same time every day with your evening meal. If you have difficulty swallowing the tablet whole, you may crush it and mix in applesauce just prior to taking your dose.  Take Xarelto exactly as prescribed by your doctor and DO NOT stop taking Xarelto without talking to the doctor who prescribed the medication.  Stopping without other stroke prevention medication to take the place of Xarelto may increase your risk of developing a clot that causes a stroke.  Refill your prescription before you run out.  After discharge, you should have regular check-up appointments with your healthcare provider that is prescribing your Xarelto.  In the future your dose may need to be changed if your kidney function or weight changes by a significant amount.  What do you do if you miss a dose? If you are taking Xarelto ONCE DAILY and you miss a dose, take it as soon as you remember on the same day then continue your regularly scheduled once daily regimen the next day. Do not take two doses of Xarelto at the same time or on the same day.   Important Safety Information A possible side effect of Xarelto is bleeding. You should call your healthcare provider right away if you experience any of the following: ? Bleeding from an injury or your nose that does not stop. ? Unusual colored urine (red or dark brown) or unusual colored stools (red or black). ? Unusual bruising for unknown reasons. ? A serious fall or if you hit your head (even if there is no bleeding).  Some medicines may interact with Xarelto  and might increase your risk of bleeding while on Xarelto. To help avoid this, consult your healthcare provider or pharmacist prior to using any new prescription or non-prescription medications, including herbals, vitamins, non-steroidal anti-inflammatory drugs (NSAIDs) and supplements.  This website has more information on Xarelto: [email or web address]

## 2020-05-25 NOTE — ED Provider Notes (Signed)
[facility] EMERGENCY DEPARTMENT Provider Note   CSN: [id number] Arrival date & time: 05/25/20  2012     History Chief Complaint  Patient presents with  . Fall    Steve Brown is a 80 y.o. male.  Patient presents from home as a level 2 trauma for unwitnessed fall.  Patient is on Xarelto and not sure exactly how he fell but last thing remembers he was on the floor.  Patient complains of back pain and left thigh/hip pain.  Pain with range of motion.  Patient was seen at outside hospital yesterday for left sciatic pain.  Patient denies any new abdominal or chest pain, no shortness of breath.        History reviewed. No pertinent past medical history.  There are no problems to display for this patient.   History reviewed. No pertinent surgical history.     History reviewed. No pertinent family history.  Social History   Tobacco Use  . Smoking status: Never Smoker  Substance Use Topics  . Alcohol use: Not Currently  . Drug use: Never    Home Medications Prior to Admission medications   Not on File    Allergies    Tramadol  Review of Systems   Review of Systems  Constitutional: Negative for chills and fever.  HENT: Negative for congestion.   Eyes: Negative for visual disturbance.  Respiratory: Negative for shortness of breath.   Cardiovascular: Negative for chest pain.  Gastrointestinal: Negative for abdominal pain and vomiting.  Genitourinary: Negative for dysuria and flank pain.  Musculoskeletal: Positive for back pain and gait problem. Negative for neck pain and neck stiffness.  Skin: Negative for rash.  Neurological: Negative for weakness, light-headedness, numbness and headaches.    Physical Exam Updated Vital Signs BP (!) 149/81   Pulse 76   Temp 99.2 F (37.3 C) (Oral)   Resp 20   Ht 5\' 5"  (1.651 m)   Wt 97.5 kg   SpO2 98%   BMI 35.78 kg/m   Physical Exam Vitals and nursing note reviewed.  Constitutional:       Appearance: He is well-developed.  HENT:     Head: Normocephalic and atraumatic.     Nose: No congestion.  Eyes:     General:        Right eye: No discharge.        Left eye: No discharge.     Conjunctiva/sclera: Conjunctivae normal.  Neck:     Trachea: No tracheal deviation.  Cardiovascular:     Rate and Rhythm: Normal rate and regular rhythm.  Pulmonary:     Effort: Pulmonary effort is normal.     Breath sounds: Normal breath sounds.  Abdominal:     General: There is no distension.     Palpations: Abdomen is soft.     Tenderness: There is no abdominal tenderness. There is no guarding.  Musculoskeletal:        General: Tenderness and signs of injury present. No swelling.     Cervical back: Normal range of motion and neck supple.     Comments: Patient has mild tenderness anterior and lateral left hip, no tenderness to right hip, knees bilateral or other lower extremity regions.  Patient has normal strength lower extremities with flexion extension mild discomfort left hip.  Upper extremities nontender.  Patient has midline paraspinal mid thoracic and lower lumbar.  No step-off.  Skin:    General: Skin is warm.     Findings: No  rash.  Neurological:     General: No focal deficit present.     Mental Status: He is alert and oriented to person, place, and time.     Comments: gen weakness  Psychiatric:        Mood and Affect: Mood normal.     Comments: Soft spoken     ED Results / Procedures / Treatments   Labs (all labs ordered are listed, but only abnormal results are displayed) Labs Reviewed  COMPREHENSIVE METABOLIC PANEL - Abnormal; Notable for the following components:      Result Value   Sodium 134 (*)    Glucose, Bld 358 (*)    Creatinine, Ser 1.45 (*)    Total Bilirubin 1.7 (*)    GFR, Estimated 49 (*)    All other components within normal limits  CBC WITH DIFFERENTIAL/PLATELET - Abnormal; Notable for the following components:   Monocytes Absolute 1.1 (*)    All  other components within normal limits  URINALYSIS, ROUTINE W REFLEX MICROSCOPIC - Abnormal; Notable for the following components:   APPearance CLOUDY (*)    Glucose, UA >=500 (*)    Hgb urine dipstick SMALL (*)    Ketones, ur 20 (*)    Protein, ur 30 (*)    Leukocytes,Ua LARGE (*)    WBC, UA >50 (*)    Bacteria, UA FEW (*)    All other components within normal limits  CK - Abnormal; Notable for the following components:   Total CK 406 (*)    All other components within normal limits  TROPONIN I (HIGH SENSITIVITY) - Abnormal; Notable for the following components:   Troponin I (High Sensitivity) 40 (*)    All other components within normal limits  TROPONIN I (HIGH SENSITIVITY) - Abnormal; Notable for the following components:   Troponin I (High Sensitivity) 52 (*)    All other components within normal limits  RESP PANEL BY RT-PCR (FLU A&B, COVID) [id number]  URINE CULTURE  ETHANOL    EKG None  Radiology CT Head Wo Contrast  Result Date: 05/25/2020 CLINICAL DATA:  Status post fall. EXAM: CT HEAD WITHOUT CONTRAST TECHNIQUE: Contiguous axial images were obtained from the base of the skull through the vertex without intravenous contrast. COMPARISON:  September 03, 2019 FINDINGS: Brain: There is mild cerebral atrophy with widening of the extra-axial spaces and ventricular dilatation. There are areas of decreased attenuation within the white matter tracts of the supratentorial brain, consistent with microvascular disease changes. Vascular: No hyperdense vessel or unexpected calcification. Skull: Normal. Negative for fracture or focal lesion. Sinuses/Orbits: There is stable marked severity posterior left ethmoid sinus and sphenoid sinus mucosal thickening. Other: A 2.8 cm x 0.6 cm left frontal scalp lipoma is noted (approximately -108.63 Hounsfield units). IMPRESSION: 1. Generalized cerebral atrophy. 2. No acute intracranial abnormality. 3. Chronic left ethmoid sinus and sphenoid sinus disease.  Electronically Signed   By: Virgina Norfolk M.D.   On: 05/25/2020 21:26   CT Cervical Spine Wo Contrast  Result Date: 05/25/2020 CLINICAL DATA:  Status post fall. EXAM: CT CERVICAL SPINE WITHOUT CONTRAST TECHNIQUE: Multidetector CT imaging of the cervical spine was performed without intravenous contrast. Multiplanar CT image reconstructions were also generated. COMPARISON:  September 03, 2019 FINDINGS: Alignment: Normal. Skull base and vertebrae: No acute fracture. A radiopaque fixation plate and fixation screws are seen along the anterior aspect of the C3, C4, C5, C6 and C7 levels. Arthrodesis is also seen throughout the fused levels. These areas are unchanged in  appearance and position. Associated streak artifact is noted with subsequently limited evaluation of the adjacent osseous and soft tissue structures. Soft tissues and spinal canal: No prevertebral fluid or swelling. No visible canal hematoma. Disc levels: Stable moderate to marked severity endplate sclerosis and associated intervertebral disc space narrowing is noted at the levels of C6-C7 and C7-T1. Bilateral moderate severity multilevel facet joint hypertrophy is noted. Upper chest: Negative. Other: None. IMPRESSION: 1. Stable extensive postoperative changes consistent with C3 through C7 fusion with corpectomy. 2. No acute cervical spine fracture. 3. Stable degenerative changes at the levels of C6-C7 and C7-T1. Electronically Signed   By: Virgina Norfolk M.D.   On: 05/25/2020 21:31   CT Thoracic Spine Wo Contrast  Result Date: 05/25/2020 CLINICAL DATA:  Fall EXAM: CT THORACIC AND LUMBAR SPINE WITHOUT CONTRAST TECHNIQUE: Multidetector CT imaging of the thoracic and lumbar spine was performed without contrast. Multiplanar CT image reconstructions were also generated. COMPARISON:  Lumbar spine CT 09/03/2019 FINDINGS: CT THORACIC SPINE FINDINGS Alignment: Normal. Vertebrae: No acute fracture or focal pathologic process. Paraspinal and other soft  tissues: Calcific aortic atherosclerosis. Disc levels: No bony spinal canal stenosis. CT LUMBAR SPINE FINDINGS Segmentation: 5 lumbar type vertebrae. Alignment: Normal. Vertebrae: No acute fracture or focal pathologic process. Chronic height loss at L4 is unchanged. Paraspinal and other soft tissues: Negative Disc levels: Mild spinal canal stenosis at L4-5. IMPRESSION: 1. No acute fracture or static subluxation of the thoracic or lumbar spine. 2. Chronic height loss at L4, unchanged. 3. Mild spinal canal stenosis at L4-5. Aortic Atherosclerosis (ICD10-I70.0). Electronically Signed   By: Ulyses Jarred M.D.   On: 05/25/2020 21:51   CT Lumbar Spine Wo Contrast  Result Date: 05/25/2020 CLINICAL DATA:  Fall EXAM: CT THORACIC AND LUMBAR SPINE WITHOUT CONTRAST TECHNIQUE: Multidetector CT imaging of the thoracic and lumbar spine was performed without contrast. Multiplanar CT image reconstructions were also generated. COMPARISON:  Lumbar spine CT 09/03/2019 FINDINGS: CT THORACIC SPINE FINDINGS Alignment: Normal. Vertebrae: No acute fracture or focal pathologic process. Paraspinal and other soft tissues: Calcific aortic atherosclerosis. Disc levels: No bony spinal canal stenosis. CT LUMBAR SPINE FINDINGS Segmentation: 5 lumbar type vertebrae. Alignment: Normal. Vertebrae: No acute fracture or focal pathologic process. Chronic height loss at L4 is unchanged. Paraspinal and other soft tissues: Negative Disc levels: Mild spinal canal stenosis at L4-5. IMPRESSION: 1. No acute fracture or static subluxation of the thoracic or lumbar spine. 2. Chronic height loss at L4, unchanged. 3. Mild spinal canal stenosis at L4-5. Aortic Atherosclerosis (ICD10-I70.0). Electronically Signed   By: Ulyses Jarred M.D.   On: 05/25/2020 21:51   CT PELVIS WO CONTRAST  Result Date: 05/25/2020 CLINICAL DATA:  Findings suspicious for pubic symphysis fracture on the left on recent plain film EXAM: CT PELVIS WITHOUT CONTRAST TECHNIQUE:  Multidetector CT imaging of the pelvis was performed following the standard protocol without intravenous contrast. COMPARISON:  Plain film from earlier in the same day. FINDINGS: Urinary Tract: Bladder is well distended. 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 Bowel: Visualized bowel is unremarkable. The appendix is within normal limits. Vascular/Lymphatic: Vascular calcifications are seen. No significant lymphadenopathy is noted. Multiple phleboliths are noted within the scrotum. Reproductive:  Prostate is within normal limits. Other:  None. Musculoskeletal: Bony structures show no acute fracture. The areas of abnormality in the pubic symphysis were artifactual related to summation of shadows. Degenerative changes of lumbar spine and hip joints are seen. IMPRESSION: No acute fracture noted. Note is made of  a bladder diverticulum. No other focal abnormality is seen. Electronically Signed   By: Inez Catalina M.D.   On: 05/25/2020 22:38   DG Chest Portable 1 View  Result Date: 05/25/2020 CLINICAL DATA:  Recent fall with chest pain, initial encounter EXAM: PORTABLE CHEST 1 VIEW COMPARISON:  09/03/2019 FINDINGS: Cardiac shadow is enlarged but accentuated by the frontal technique. Aortic calcifications are again noted. Lungs are hypoaerated bilaterally without focal infiltrate or sizable effusion. No acute bony abnormality is noted. IMPRESSION: Poor inspiratory effort.  No acute abnormality noted. Electronically Signed   By: Inez Catalina M.D.   On: 05/25/2020 20:53   DG Hip Port Unilat W or Wo Pelvis 1 View Left  Result Date: 05/25/2020 CLINICAL DATA:  Recent fall with hip pain, initial encounter EXAM: DG HIP (WITH OR WITHOUT PELVIS) 3V PORT LEFT COMPARISON:  None. FINDINGS: There is lucency identified in the pubic symphysis on the left suspicious for undisplaced fracture. Proximal femur appears within normal limits. No soft tissue abnormality is noted. IMPRESSION: Lucency within the pubic  symphysis on the left suspicious for undisplaced fracture. Electronically Signed   By: Inez Catalina M.D.   On: 05/25/2020 20:58    Procedures Procedures   Medications Ordered in ED Medications  fentaNYL (SUBLIMAZE) injection 40 mcg (40 mcg Intravenous Given 05/25/20 2120)  acetaminophen (TYLENOL) tablet 1,000 mg (1,000 mg Oral Given 05/25/20 2155)  cefTRIAXone (ROCEPHIN) 1 g in sodium chloride 0.9 % 100 mL IVPB (0 g Intravenous Stopped 05/25/20 2333)  sodium chloride 0.9 % bolus 500 mL (500 mLs Intravenous New Bag/Given 05/25/20 2333)    ED Course  I have reviewed the triage vital signs and the nursing notes.  Pertinent labs & imaging results that were available during my care of the patient were reviewed by me and considered in my medical decision making (see chart for details).    MDM Rules/Calculators/A&P                          Patient presents after unwitnessed fall and possible head injury.  Patient level 2 trauma due to Xarelto use.  Patient does not recall details.  Differential broad including different causes of syncope/general weakness leading to fall.  Patient has left hip and back pain.  X-rays showed no obvious rib fractures, concern for occult pubic symphysis fracture.  CT scans ordered for further delineation of pain.  Patient's blood work was reviewed showing glucose 358, creatinine 1.45, sodium 134.  Patient is generally weak on exam.  Urinalysis results show increased white blood cell count, leukocytes and hemoglobin.  Concern clinically for urine infection with borderline temperature, abnormal urine and general weakness.  Patient does live alone.  With fall risk, syncope and elevated troponin patient will require observation telemetry.  Repeat troponin increased from 52-40.  Patient is having no chest pain or shortness of breath.  Vital signs on reassessment unremarkable except for mild elevated blood pressure.  Patient CT scans showed chronic arthritic findings however no  acute fracture of pelvis, spine. Hospitalist paged for admission.  Medical records reviewed no significant medical history in our medical record.  Patient received small IV fluid bolus 500 cc. EKG reviewed heart rate 79, no acute ST changes, normal QT, sinus.   Final Clinical Impression(s) / ED Diagnoses Final diagnoses:  Fall, initial encounter  Acute hip pain, left  Acute myofascial strain of lumbar region, initial encounter  Syncope and collapse  Urinary tract infection with hematuria,  site unspecified  Acute renal failure, unspecified acute renal failure type ([place])  Troponin level elevated    Rx / DC Orders ED Discharge Orders    None       Elnora Morrison, MD 05/26/20 0006

## 2020-05-25 NOTE — ED Notes (Signed)
X-ray at bedside

## 2020-05-25 NOTE — ED Triage Notes (Signed)
Pt BIB [facility] from home as level 2 trauma for unwitnessed fall on xarelto. Pt went to [facility] [place] yesterday for L sciatic pain. Had an unwitnessed fall today, pt doesn't remember what he was doing before, just remembers getting off floor. Pt c/o L lower back and L leg pain, aox4

## 2020-05-25 NOTE — ED Notes (Signed)
Pt returned from CT °

## 2020-05-26 ENCOUNTER — Observation Stay (HOSPITAL_COMMUNITY): Payer: Medicare Other

## 2020-05-26 ENCOUNTER — Encounter (HOSPITAL_COMMUNITY): Payer: Self-pay | Admitting: Internal Medicine

## 2020-05-26 DIAGNOSIS — M549 Dorsalgia, unspecified: Secondary | ICD-10-CM

## 2020-05-26 DIAGNOSIS — G9341 Metabolic encephalopathy: Secondary | ICD-10-CM

## 2020-05-26 DIAGNOSIS — N400 Enlarged prostate without lower urinary tract symptoms: Secondary | ICD-10-CM | POA: Diagnosis not present

## 2020-05-26 DIAGNOSIS — M5116 Intervertebral disc disorders with radiculopathy, lumbar region: Secondary | ICD-10-CM | POA: Diagnosis not present

## 2020-05-26 DIAGNOSIS — N390 Urinary tract infection, site not specified: Secondary | ICD-10-CM

## 2020-05-26 DIAGNOSIS — N261 Atrophy of kidney (terminal): Secondary | ICD-10-CM | POA: Diagnosis not present

## 2020-05-26 DIAGNOSIS — Z9049 Acquired absence of other specified parts of digestive tract: Secondary | ICD-10-CM | POA: Diagnosis not present

## 2020-05-26 DIAGNOSIS — I878 Other specified disorders of veins: Secondary | ICD-10-CM | POA: Diagnosis not present

## 2020-05-26 DIAGNOSIS — R55 Syncope and collapse: Secondary | ICD-10-CM

## 2020-05-26 DIAGNOSIS — E119 Type 2 diabetes mellitus without complications: Secondary | ICD-10-CM

## 2020-05-26 DIAGNOSIS — R140 Abdominal distension (gaseous): Secondary | ICD-10-CM | POA: Diagnosis not present

## 2020-05-26 DIAGNOSIS — K449 Diaphragmatic hernia without obstruction or gangrene: Secondary | ICD-10-CM | POA: Diagnosis not present

## 2020-05-26 LAB — HEMOGLOBIN A1C
Hgb A1c MFr Bld: 7.5 % — ABNORMAL HIGH (ref 4.8–5.6)
Mean Plasma Glucose: 168.55 mg/dL

## 2020-05-26 LAB — BILIRUBIN, FRACTIONATED(TOT/DIR/INDIR)
Bilirubin, Direct: 0.2 mg/dL (ref 0.0–0.2)
Indirect Bilirubin: 0.9 mg/dL (ref 0.3–0.9)
Total Bilirubin: 1.1 mg/dL (ref 0.3–1.2)

## 2020-05-26 LAB — BASIC METABOLIC PANEL
Anion gap: 10 (ref 5–15)
BUN: 17 mg/dL (ref 8–23)
CO2: 22 mmol/L (ref 22–32)
Calcium: 8.6 mg/dL — ABNORMAL LOW (ref 8.9–10.3)
Chloride: 104 mmol/L (ref 98–111)
Creatinine, Ser: 1.17 mg/dL (ref 0.61–1.24)
GFR, Estimated: >60 mL/min (ref >60)
Glucose, Bld: 251 mg/dL — ABNORMAL HIGH (ref 70–99)
Potassium: 4.2 mmol/L (ref 3.5–5.1)
Sodium: 136 mmol/L (ref 135–145)

## 2020-05-26 LAB — GLUCOSE, CAPILLARY
Glucose-Capillary: 234 mg/dL — ABNORMAL HIGH (ref 70–99)
Glucose-Capillary: 214 mg/dL — ABNORMAL HIGH (ref 70–99)

## 2020-05-26 LAB — D-DIMER, QUANTITATIVE: D-Dimer, Quant: 0.62 ug/mL-FEU — ABNORMAL HIGH (ref 0.00–0.50)

## 2020-05-26 LAB — ECHOCARDIOGRAM COMPLETE
Area-P 1/2: 3.12 cm2
Height: 65 in
S' Lateral: 2.8 cm
Weight: 3440 oz

## 2020-05-26 LAB — URINE CULTURE: Culture: 80000 — AB

## 2020-05-26 LAB — AMMONIA: Ammonia: 29 umol/L (ref 9–35)

## 2020-05-26 LAB — CBG MONITORING, ED
Glucose-Capillary: 211 mg/dL — ABNORMAL HIGH (ref 70–99)
Glucose-Capillary: 243 mg/dL — ABNORMAL HIGH (ref 70–99)

## 2020-05-26 LAB — CK: Total CK: 406 U/L — ABNORMAL HIGH (ref 49–397)

## 2020-05-26 LAB — VITAMIN B12: Vitamin B-12: 480 pg/mL (ref 180–914)

## 2020-05-26 LAB — TSH: TSH: 1.731 u[IU]/mL (ref 0.350–4.500)

## 2020-05-26 MED ORDER — PREGABALIN 100 MG PO CAPS
300.0000 mg | ORAL_CAPSULE | Freq: Two times a day (BID) | ORAL | Status: DC
  Administered 2020-05-26 – 2020-05-31 (×11): 300 mg via ORAL
  Filled 2020-05-26 (×11): qty 3

## 2020-05-26 MED ORDER — INSULIN ASPART 100 UNIT/ML IJ SOLN
0.0000 [IU] | Freq: Three times a day (TID) | INTRAMUSCULAR | Status: DC
  Administered 2020-05-26: 3 [IU] via SUBCUTANEOUS
  Administered 2020-05-26: 5 [IU] via SUBCUTANEOUS
  Administered 2020-05-27: 8 [IU] via SUBCUTANEOUS
  Administered 2020-05-27: 5 [IU] via SUBCUTANEOUS
  Administered 2020-05-27: 2 [IU] via SUBCUTANEOUS
  Administered 2020-05-28: 5 [IU] via SUBCUTANEOUS
  Administered 2020-05-28: 2 [IU] via SUBCUTANEOUS
  Administered 2020-05-28: 3 [IU] via SUBCUTANEOUS
  Administered 2020-05-29 (×3): 5 [IU] via SUBCUTANEOUS
  Administered 2020-05-30: 11 [IU] via SUBCUTANEOUS
  Administered 2020-05-30: 3 [IU] via SUBCUTANEOUS
  Administered 2020-05-30: 5 [IU] via SUBCUTANEOUS
  Administered 2020-05-31: 8 [IU] via SUBCUTANEOUS
  Administered 2020-05-31: 3 [IU] via SUBCUTANEOUS
  Administered 2020-05-31: 5 [IU] via SUBCUTANEOUS

## 2020-05-26 MED ORDER — RIVAROXABAN 20 MG PO TABS
20.0000 mg | ORAL_TABLET | Freq: Every day | ORAL | Status: DC
  Administered 2020-05-26 – 2020-05-30 (×5): 20 mg via ORAL
  Filled 2020-05-26 (×5): qty 1

## 2020-05-26 MED ORDER — BARIUM SULFATE 2.1 % PO SUSP
900.0000 mL | ORAL | Status: AC
  Administered 2020-05-26 (×2): 900 mL via ORAL

## 2020-05-26 MED ORDER — INSULIN GLARGINE 100 UNIT/ML ~~LOC~~ SOLN
28.0000 [IU] | Freq: Every day | SUBCUTANEOUS | Status: DC
  Administered 2020-05-26 – 2020-05-29 (×4): 28 [IU] via SUBCUTANEOUS
  Filled 2020-05-26 (×5): qty 0.28

## 2020-05-26 MED ORDER — ROSUVASTATIN CALCIUM 5 MG PO TABS
10.0000 mg | ORAL_TABLET | Freq: Every day | ORAL | Status: DC
  Administered 2020-05-26 – 2020-05-31 (×6): 10 mg via ORAL
  Filled 2020-05-26 (×6): qty 2

## 2020-05-26 MED ORDER — INSULIN ASPART 100 UNIT/ML IJ SOLN
0.0000 [IU] | Freq: Every day | INTRAMUSCULAR | Status: DC
  Administered 2020-05-26: 2 [IU] via SUBCUTANEOUS
  Administered 2020-05-27: 3 [IU] via SUBCUTANEOUS
  Administered 2020-05-28 – 2020-05-30 (×3): 2 [IU] via SUBCUTANEOUS

## 2020-05-26 MED ORDER — ACETAMINOPHEN 325 MG PO TABS
650.0000 mg | ORAL_TABLET | Freq: Four times a day (QID) | ORAL | Status: DC | PRN
  Filled 2020-05-26: qty 2

## 2020-05-26 MED ORDER — INSULIN DEGLUDEC 100 UNIT/ML ~~LOC~~ SOPN: 28.0000 [IU] | PEN_INJECTOR | Freq: Every day | SUBCUTANEOUS | Status: DC

## 2020-05-26 MED ORDER — PANTOPRAZOLE SODIUM 40 MG PO TBEC
40.0000 mg | DELAYED_RELEASE_TABLET | Freq: Every morning | ORAL | Status: DC
  Administered 2020-05-26 – 2020-05-31 (×6): 40 mg via ORAL
  Filled 2020-05-26 (×5): qty 1

## 2020-05-26 MED ORDER — OXYCODONE HCL 5 MG PO TABS
5.0000 mg | ORAL_TABLET | Freq: Four times a day (QID) | ORAL | Status: DC | PRN
  Administered 2020-05-26 – 2020-05-31 (×11): 5 mg via ORAL
  Filled 2020-05-26 (×11): qty 1

## 2020-05-26 MED ORDER — SODIUM CHLORIDE 0.9 % IV SOLN
1.0000 g | INTRAVENOUS | Status: AC
  Administered 2020-05-26 – 2020-05-29 (×4): 1 g via INTRAVENOUS
  Filled 2020-05-26 (×4): qty 10

## 2020-05-26 MED ORDER — ACETAMINOPHEN 650 MG RE SUPP: 650.0000 mg | Freq: Four times a day (QID) | RECTAL | Status: DC | PRN

## 2020-05-26 NOTE — Progress Notes (Signed)
Placed patient on CPAP via auto-mode for the night.

## 2020-05-26 NOTE — Progress Notes (Signed)
  Echocardiogram 2D Echocardiogram has been performed.  Steve Brown 05/26/2020, 2:01 PM

## 2020-05-26 NOTE — Consult Note (Signed)
CC: fall  HPI:     Patient is a 80 y.o. male with DM, CHF,  and hx of CVA, CAD, PE on Xarelto, and cervical spine fusion who presented to the hospital after a syncopal episode with a fall.  His imaging showed no acute structural injury to his spine, but he does complain of back pain and left leg sciatica.  His left leg pain radiates down to his foot and has been present for years, though he states it is worse now than several years ago.  It is unclear if this contributed to his fall.  He was recently diagnosed with a UTI 2 days ago.  He saw Dr. Vertell Limber 5 years ago for his back pain and leg pain and a lumbar fusion was recommended, but he declined this.  He had a cervical corpectomy and fusion in 2008 in [place].    Patient Active Problem List   Diagnosis Date Noted  . Syncope 05/26/2020  . Back pain 05/26/2020  . UTI (urinary tract infection) 05/26/2020  . Acute metabolic encephalopathy 44/03/4740  . Diabetes ([place]) 05/26/2020   History reviewed. No pertinent past medical history.  History reviewed. No pertinent surgical history.  Medications Prior to Admission  Medication Sig Dispense Refill Last Dose  . acetaminophen (TYLENOL) 500 MG tablet Take 500 mg by mouth every 6 (six) hours as needed for moderate pain or headache.   Past Month at Unknown time  . diclofenac Sodium (VOLTAREN) 1 % GEL Apply 2-4 g topically 4 (four) times daily as needed (pain).   Past Month at Unknown time  . fluticasone (FLONASE) 50 MCG/ACT nasal spray Place 1 spray into both nostrils daily as needed for allergies.   unk  . furosemide (LASIX) 20 MG tablet Take 20-40 mg by mouth daily as needed for fluid.   Past Month at Unknown time  . losartan (COZAAR) 100 MG tablet Take 100 mg by mouth daily.   05/25/2020 at Unknown time  . NOVOLOG FLEXPEN 100 UNIT/ML FlexPen Inject 0-20 Units into the skin See admin instructions. Tid with meals per sliding scale   05/25/2020 at Unknown time  . pantoprazole (PROTONIX) 40 MG tablet  Take 40 mg by mouth every morning.   05/25/2020 at Unknown time  . pregabalin (LYRICA) 300 MG capsule Take 300 mg by mouth 2 (two) times daily.   05/25/2020 at Unknown time  . rosuvastatin (CRESTOR) 10 MG tablet Take 10 mg by mouth daily.   05/25/2020 at Unknown time  . sitaGLIPtin (JANUVIA) 50 MG tablet Take 50 mg by mouth daily.   05/25/2020 at Unknown time  . TRESIBA FLEXTOUCH 100 UNIT/ML FlexTouch Pen Inject 28 Units into the skin daily.   05/25/2020 at Unknown time  . XARELTO 20 MG TABS tablet Take 20 mg by mouth daily.   05/25/2020 at 0800   Allergies  Allergen Reactions  . Iohexol   . Phenergan [Promethazine]     Other reaction(s): syncope  . Tramadol     Social History   Tobacco Use  . Smoking status: Never Smoker  . Smokeless tobacco: Not on file  Substance Use Topics  . Alcohol use: Not Currently    History reviewed. No pertinent family history.   Review of Systems Pertinent items noted in HPI and remainder of comprehensive ROS otherwise negative.  Objective:   Patient Vitals for the past 8 hrs:  BP Temp Temp src Pulse Resp SpO2 Height Weight  05/26/20 1541 (!) 165/93 98.4 F (36.9 C) Oral  67 20 97 % 5\' 5"  (1.651 m) 93.7 kg  05/26/20 1445 (!) 151/68 -- -- 68 (!) 25 94 % -- --  05/26/20 1330 (!) 148/79 -- -- 78 17 94 % -- --  05/26/20 1245 (!) 177/74 -- -- 65 (!) 21 98 % -- --  05/26/20 1200 (!) 151/80 -- -- 66 17 100 % -- --  05/26/20 1130 140/70 -- -- 60 13 94 % -- --  05/26/20 1034 (!) 147/75 97.6 F (36.4 C) Oral 65 16 96 % -- --   I/O last 3 completed shifts: In: 500 [IV Piggyback:500] Out: -  No intake/output data recorded.      General : Alert, cooperative, no distress, appears stated age   Head:  Normocephalic/atraumatic    Eyes: PERRL, conjunctiva/corneas clear, EOM's intact. Fundi could not be visualized Neck: Supple Chest:  Respirations unlabored Chest wall: no tenderness or deformity Heart: Regular rate and rhythm Abdomen: Soft, nontender and  nondistended Extremities: warm and well-perfused Skin: normal turgor, color and texture Neurologic:  Alert, oriented x 3, but cognitive deficit noted.  Eyes open spontaneously. PERRL, EOMI, VFC, no facial droop. V1-3 intact.  No dysarthria, tongue protrusion symmetric.  CNII-XII intact. Normal strength, sensation and reflexes throughout.  No pronator drift, full strength in legs       Data Review CBC:  Lab Results  Component Value Date   WBC 8.8 05/25/2020   RBC 4.41 05/25/2020   BMP:  Lab Results  Component Value Date   GLUCOSE 251 (H) 05/26/2020   CO2 22 05/26/2020   BUN 17 05/26/2020   CREATININE 1.17 05/26/2020   CALCIUM 8.6 (L) 05/26/2020   Radiology review:   CT C-spine shows well-healed cervical corpectomy.  CT L spine shows multilevel degen changes, including severe L L4-5 facet arthropathy. MRI L-spine from Sept 2021 was reviewed.  There is severe L L4-5 facet arthropathy with joint effusion and subarticular osteophyte resulting in impingement of the traversing L5 nerve root.  There is grade 1 spondylolisthesis.  There is crowding of nerve roots at L3-4 from epidural lipomatosis and broad based disc bulge  Assessment:   Principal Problem:   Syncope Active Problems:   Back pain   UTI (urinary tract infection)   Acute metabolic encephalopathy   Diabetes ([place])  This is a 80 yo M with DM, CHF,  and hx of CVA, CAD, PE on Xarelto, and cervical spine fusion who has chronic left leg radiculopathy and back pain, likely related to severe degenerative L L4-5 arthropathy.  He is being hospitalized for workup of syncope and fall.  Plan:  -- no acute surgical intervention is indicated.  He can f/u in neurosurgery clinic in 4-6 weeks.  Given his significant medical comorbidities and age, he ultimately  likely would be a better candidate for injections and continued nonsurgical therapies rather than surgery, which likely would entail L4-5 TLIF.  He verbalized understanding and  agreement with the plan.

## 2020-05-26 NOTE — ED Notes (Signed)
EEG in patient's room at this time

## 2020-05-26 NOTE — Progress Notes (Signed)
Carotid duplex bilateral study completed.   Please see CV Proc for preliminary results.   Charleigh Correnti, RDMS, RVT  

## 2020-05-26 NOTE — ED Notes (Signed)
Nurse report called awaiting transport.

## 2020-05-26 NOTE — Progress Notes (Signed)
EEG complete - results pending 

## 2020-05-26 NOTE — Progress Notes (Signed)
Patient seen and examined personally, I reviewed the chart, history and physical and admission note, done by admitting physician this morning and agree with the same with following addendum.  Please refer to the morning admission note for more detailed plan of care.  Briefly, 80 year old male with multiple complex comorbidities with CVA, insulin-dependent diabetes, CAD status post PCI, chronic diastolic CHF, OSA on CPAP, HTN, HLD, PE history on Xarelto, GERD, depression, CKD stage IIIa, BPH presented after a fall unwitnessed at home.  He was complaining of back pain left thigh/hip pain and brought to the ED after unwitnessed fall, patient unable to recollect.  Of note he was seen in the ED 2 days prior to admission diagnosed with UTI sent home on Keflex. Underwent extensive work-up in the ED-with abnormal UA WBC more than 50, urine culture sent, CK 46, slightly + trops 40> 52, but without chest pain.  Chest x-ray normal, x-ray of the pelvis left hip lucency within the pubic symphysis on the left suspicious for nondisplaced fracture, subsequently underwent CT pelvis negative for fracture also had CT lumbar spine, CT cervical thoracic and lumbar spine,CT head-no acute fracture or subluxation, postop changes see 3 through C7 fusion with corpectomy, chronic height loss at L4, mild spinal canal stenosis at L4-5.  Patient admitted for follow-up of unwittnessed Fall, placed antibiotic for UTI.  Seen and examined C/o back pain and sciatica, numbnes tingling on both legs. He appears alert awake and oriented Follows commands appropriately Patient otherwise denies any nausea, vomiting, chest pain, shortness of breath, fever, chills, headache, focal weakness,speech difficulties.  Unwitnessed fall/concern for syncope: Work-up in progress with echo EEG carotid Dopplers.  PT OT eval. check orthostatic vitals  Chronic back pain/left leg pain:Previous MRI in September 2021 with moderately spinal stenosis L3-L4, L4-L5  and severe subarticular stenosis on the left due to facet hypertrophy and left paracentral disc protrusion.  CT C, thoracic and lumbar spine acute finding.  Complains of sciatica bilateral leg numbness which are chronic.  Discussed w/ neurosurgeon Dr Marcello Moores- less likely will need acute intervention.PT OT eval  UTI POA recently sent home on Keflex.Continue on ceftriaxone for now follow-up culture Acute metabolic encephalopathy present on admission CT negative, this morning alert awake oriented continue empiric treatment for UTI.  TSH B12, ammonia level normal.   Mildly borderline positive troponin,unclear etiology no chest pain no ischemic change on EKG D dimer 0.6-patient without chest pain shortness or tachycardia or any other sings of PE.  Corrected for his age D-dimer probably normal.  I discussed with patient extensively about this finding, patient feels comfortable and after discussion does not want to proceed with CTA at this time, is already anticoagulated on Xarelto.Very low suspicion for PE.  Other issues per HPI Abdomen distension/obese- with normal KUB Insulin-dependent diabetes poorly controlled hyperglycemia.  Hemoglobin A1c 7.5.  Continue sliding scale insulin/home Lantus OSA on CPAP Chronic diastolic CHF CAD history of PCI CKD stage IIIa, baseline creatinine 1.2-1.4 Hyperbilirubinemia stable GERD on PPI Hyperlipidemia

## 2020-05-26 NOTE — H&P (Addendum)
History and Physical    Steve Brown CHE:[id number] DOB: 1940-01-11 DOA: 05/25/2020  PCP: Steve Cruel, MD Patient coming from: Home  Chief Complaint: Fall  Patient has a duplicate chart under MRN [id number] (listed under patient chart advisories tab).  HPI: Steve Brown is a 80 y.o. male with medical history significant of CVA, insulin-dependent type 2 diabetes, CAD status post PCI, chronic diastolic CHF, OSA on CPAP, hypertension, hyperlipidemia, history of PE on Xarelto, GERD, depression, CKD stage IIIa, BPH presented to the ED via EMS as a level 2 trauma after an unwitnessed fall.  He complained of back pain and left thigh/hip pain.  Slightly hypertensive.  Not febrile or tachycardic.  Labs showing WBC 8.8, hemoglobin 13.9, platelet count 162K.  Sodium 134, potassium 4.3, chloride 100, bicarb 25, BUN 20, creatinine 1.4, glucose 358.  T bili 1.7, remainder of LFTs normal.  Blood ethanol level undetectable.  High-sensitivity troponin mildly elevated 40 >52.  EKG without acute ischemic changes.  COVID and influenza PCR negative.  UA with large amount of leukocytes, greater than 50 WBCs, and few bacteria.  Urine culture pending.  CK 406.  Chest x-ray negative for acute finding.  X-ray of left hip/pelvis showing lucency within the pubic symphysis on the left suspicious for undisplaced fracture.  Pelvic x-ray was done and did not show acute fracture.  Head CT negative for acute finding.  CT cervical, thoracic, and lumbar spine negative for acute finding. Patient was given Tylenol, ceftriaxone, fentanyl, and 500 cc fluid bolus.  Of note, patient was seen in the ED 2 days ago for back pain and diagnosed with UTI.  He was prescribed Keflex.  CT renal stone study done during this visit did not show any acute finding.  Patient is AAOx3 but slow to respond to questions.  Appears confused and does not know why he is here.  He does not recall falling or passing out.  He thinks he is here because he  has pain in his left leg from sciatica.  He has no other complaints.  Denies fevers, chills, dizziness, chest pain, palpitations, shortness of breath, nausea, vomiting, abdominal pain, or diarrhea.  Not able to give any additional history.  Review of Systems:  All systems reviewed and apart from history of presenting illness, are negative blood pressure elevated,.  Past medical history: Extensive.  See HPI.  Past surgical history: Extensive.  See duplicate chart.   reports that he has never smoked. He does not have any smokeless tobacco history on file. He reports previous alcohol use. He reports that he does not use drugs.  Allergies  Allergen Reactions  . Iohexol   . Phenergan [Promethazine]     Other reaction(s): syncope  . Tramadol     Family history: See duplicate chart.  Prior to Admission medications   Medication Sig Start Date End Date Taking? Authorizing Provider  acetaminophen (TYLENOL) 500 MG tablet Take 500 mg by mouth every 6 (six) hours as needed for moderate pain or headache.   Yes [provider]  diclofenac Sodium (VOLTAREN) 1 % GEL Apply 2-4 g topically 4 (four) times daily as needed (pain). 02/12/20  Yes [provider]  fluticasone (FLONASE) 50 MCG/ACT nasal spray Place 1 spray into both nostrils daily as needed for allergies.   Yes [provider]  furosemide (LASIX) 20 MG tablet Take 20-40 mg by mouth daily as needed for fluid. 01/22/20  Yes [provider]  losartan (COZAAR) 100 MG tablet Take 100 mg  by mouth daily. 03/15/20  Yes [provider]  NOVOLOG FLEXPEN 100 UNIT/ML FlexPen Inject 0-20 Units into the skin See admin instructions. Tid with meals per sliding scale 12/09/19  Yes [provider]  pantoprazole (PROTONIX) 40 MG tablet Take 40 mg by mouth every morning. 02/16/20  Yes [provider]  pregabalin (LYRICA) 300 MG capsule Take 300 mg by mouth 2 (two) times daily. 04/26/20  Yes [provider]  rosuvastatin (CRESTOR) 10 MG tablet Take 10 mg by mouth daily. 02/16/20  Yes [provider]  sitaGLIPtin (JANUVIA) 50 MG tablet Take 50 mg by mouth daily.   Yes [provider]  TRESIBA FLEXTOUCH 100 UNIT/ML FlexTouch Pen Inject 28 Units into the skin daily. 05/18/20  Yes [provider]  XARELTO 20 MG TABS tablet Take 20 mg by mouth daily. 02/24/20  Yes [provider]    Physical Exam: Vitals:   05/26/20 0530 05/26/20 0545 05/26/20 0600 05/26/20 0615  BP: (!) 145/80 (!) 152/82 (!) 155/78 (!) 146/88  Pulse: 61 66 68 63  Resp: 13 18 15 17   Temp:      TempSrc:      SpO2: 96% 96% 96% 95%  Weight:      Height:        Physical Exam Constitutional:      General: He is not in acute distress. HENT:     Head: Normocephalic and atraumatic.  Eyes:     Extraocular Movements: Extraocular movements intact.     Conjunctiva/sclera: Conjunctivae normal.  Cardiovascular:     Rate and Rhythm: Normal rate and regular rhythm.     Pulses: Normal pulses.  Pulmonary:     Effort: Pulmonary effort is normal. No respiratory distress.     Breath sounds: Normal breath sounds. No wheezing or rales.  Abdominal:     General: Bowel sounds are normal. There is distension.     Palpations: Abdomen is soft.     Tenderness: There is no abdominal tenderness. There is no guarding.  Musculoskeletal:     Cervical back: Normal range of motion and neck supple.     Right lower leg: Edema present.     Left lower leg: Edema present.     Comments: Bilateral pedal edema  Skin:    General: Skin is warm and dry.  Neurological:     General: No focal deficit present.     Mental Status: He is alert and oriented to person, place, and time.     Labs on Admission: I have personally reviewed following labs and imaging studies  CBC: Recent Labs  Lab 05/25/20 2032  WBC 8.8  NEUTROABS 6.6  HGB 13.9  HCT 41.4  MCV 93.9  PLT 542   Basic Metabolic Panel: Recent Labs  Lab  05/25/20 2032  NA 134*  K 4.3  CL 100  CO2 25  GLUCOSE 358*  BUN 20  CREATININE 1.45*  CALCIUM 9.2   GFR: Estimated Creatinine Clearance: 45.1 mL/min (A) (by C-G formula based on SCr of 1.45 mg/dL (H)). Liver Function Tests: Recent Labs  Lab 05/25/20 2032 05/26/20 0728  AST 25  --   ALT 25  --   ALKPHOS 65  --   BILITOT 1.7* 1.1  PROT 7.5  --   ALBUMIN 3.7  --    No results for input(s): LIPASE, AMYLASE in the last 168 hours. Recent Labs  Lab 05/26/20 0728  AMMONIA 29   Coagulation Profile: No results for input(s): INR,  PROTIME in the last 168 hours. Cardiac Enzymes: Recent Labs  Lab 05/25/20 2230  CKTOTAL 406*   BNP (last 3 results) No results for input(s): PROBNP in the last 8760 hours. HbA1C: No results for input(s): HGBA1C in the last 72 hours. CBG: No results for input(s): GLUCAP in the last 168 hours. Lipid Profile: No results for input(s): CHOL, HDL, LDLCALC, TRIG, CHOLHDL, LDLDIRECT in the last 72 hours. Thyroid Function Tests: No results for input(s): TSH, T4TOTAL, FREET4, T3FREE, THYROIDAB in the last 72 hours. Anemia Panel: No results for input(s): VITAMINB12, FOLATE, FERRITIN, TIBC, IRON, RETICCTPCT in the last 72 hours. Urine analysis:    Component Value Date/Time   COLORURINE YELLOW 05/25/2020 2203   APPEARANCEUR CLOUDY (A) 05/25/2020 2203   LABSPEC 1.016 05/25/2020 2203   PHURINE 5.0 05/25/2020 2203   GLUCOSEU >=500 (A) 05/25/2020 2203   HGBUR SMALL (A) 05/25/2020 2203   BILIRUBINUR NEGATIVE 05/25/2020 2203   KETONESUR 20 (A) 05/25/2020 2203   PROTEINUR 30 (A) 05/25/2020 2203   NITRITE NEGATIVE 05/25/2020 2203   LEUKOCYTESUR LARGE (A) 05/25/2020 2203    Radiological Exams on Admission: DG Abd 1 View  Result Date: 05/26/2020 CLINICAL DATA:  80 year old male with abdominal distension. EXAM: ABDOMEN - 1 VIEW COMPARISON:  CT Abdomen and Pelvis 05/24/2020. FINDINGS: AP view at 0635 hours. Non obstructed bowel gas pattern. Stable  cholecystectomy clips. Partially visible surgical clips about the prostate. Pelvic phleboliths. No acute osseous abnormality identified. IMPRESSION: Non obstructed bowel gas pattern. Electronically Signed   By: Genevie Ann M.D.   On: 05/26/2020 07:04   CT Head Wo Contrast  Result Date: 05/25/2020 CLINICAL DATA:  Status post fall. EXAM: CT HEAD WITHOUT CONTRAST TECHNIQUE: Contiguous axial images were obtained from the base of the skull through the vertex without intravenous contrast. COMPARISON:  September 03, 2019 FINDINGS: Brain: There is mild cerebral atrophy with widening of the extra-axial spaces and ventricular dilatation. There are areas of decreased attenuation within the white matter tracts of the supratentorial brain, consistent with microvascular disease changes. Vascular: No hyperdense vessel or unexpected calcification. Skull: Normal. Negative for fracture or focal lesion. Sinuses/Orbits: There is stable marked severity posterior left ethmoid sinus and sphenoid sinus mucosal thickening. Other: A 2.8 cm x 0.6 cm left frontal scalp lipoma is noted (approximately -108.63 Hounsfield units). IMPRESSION: 1. Generalized cerebral atrophy. 2. No acute intracranial abnormality. 3. Chronic left ethmoid sinus and sphenoid sinus disease. Electronically Signed   By: Virgina Norfolk M.D.   On: 05/25/2020 21:26   CT Cervical Spine Wo Contrast  Result Date: 05/25/2020 CLINICAL DATA:  Status post fall. EXAM: CT CERVICAL SPINE WITHOUT CONTRAST TECHNIQUE: Multidetector CT imaging of the cervical spine was performed without intravenous contrast. Multiplanar CT image reconstructions were also generated. COMPARISON:  September 03, 2019 FINDINGS: Alignment: Normal. Skull base and vertebrae: No acute fracture. A radiopaque fixation plate and fixation screws are seen along the anterior aspect of the C3, C4, C5, C6 and C7 levels. Arthrodesis is also seen throughout the fused levels. These areas are unchanged in appearance and  position. Associated streak artifact is noted with subsequently limited evaluation of the adjacent osseous and soft tissue structures. Soft tissues and spinal canal: No prevertebral fluid or swelling. No visible canal hematoma. Disc levels: Stable moderate to marked severity endplate sclerosis and associated intervertebral disc space narrowing is noted at the levels of C6-C7 and C7-T1. Bilateral moderate severity multilevel facet joint hypertrophy is noted. Upper chest: Negative. Other: None. IMPRESSION: 1.  Stable extensive postoperative changes consistent with C3 through C7 fusion with corpectomy. 2. No acute cervical spine fracture. 3. Stable degenerative changes at the levels of C6-C7 and C7-T1. Electronically Signed   By: Virgina Norfolk M.D.   On: 05/25/2020 21:31   CT Thoracic Spine Wo Contrast  Result Date: 05/25/2020 CLINICAL DATA:  Fall EXAM: CT THORACIC AND LUMBAR SPINE WITHOUT CONTRAST TECHNIQUE: Multidetector CT imaging of the thoracic and lumbar spine was performed without contrast. Multiplanar CT image reconstructions were also generated. COMPARISON:  Lumbar spine CT 09/03/2019 FINDINGS: CT THORACIC SPINE FINDINGS Alignment: Normal. Vertebrae: No acute fracture or focal pathologic process. Paraspinal and other soft tissues: Calcific aortic atherosclerosis. Disc levels: No bony spinal canal stenosis. CT LUMBAR SPINE FINDINGS Segmentation: 5 lumbar type vertebrae. Alignment: Normal. Vertebrae: No acute fracture or focal pathologic process. Chronic height loss at L4 is unchanged. Paraspinal and other soft tissues: Negative Disc levels: Mild spinal canal stenosis at L4-5. IMPRESSION: 1. No acute fracture or static subluxation of the thoracic or lumbar spine. 2. Chronic height loss at L4, unchanged. 3. Mild spinal canal stenosis at L4-5. Aortic Atherosclerosis (ICD10-I70.0). Electronically Signed   By: Ulyses Jarred M.D.   On: 05/25/2020 21:51   CT Lumbar Spine Wo Contrast  Result Date:  05/25/2020 CLINICAL DATA:  Fall EXAM: CT THORACIC AND LUMBAR SPINE WITHOUT CONTRAST TECHNIQUE: Multidetector CT imaging of the thoracic and lumbar spine was performed without contrast. Multiplanar CT image reconstructions were also generated. COMPARISON:  Lumbar spine CT 09/03/2019 FINDINGS: CT THORACIC SPINE FINDINGS Alignment: Normal. Vertebrae: No acute fracture or focal pathologic process. Paraspinal and other soft tissues: Calcific aortic atherosclerosis. Disc levels: No bony spinal canal stenosis. CT LUMBAR SPINE FINDINGS Segmentation: 5 lumbar type vertebrae. Alignment: Normal. Vertebrae: No acute fracture or focal pathologic process. Chronic height loss at L4 is unchanged. Paraspinal and other soft tissues: Negative Disc levels: Mild spinal canal stenosis at L4-5. IMPRESSION: 1. No acute fracture or static subluxation of the thoracic or lumbar spine. 2. Chronic height loss at L4, unchanged. 3. Mild spinal canal stenosis at L4-5. Aortic Atherosclerosis (ICD10-I70.0). Electronically Signed   By: Ulyses Jarred M.D.   On: 05/25/2020 21:51   CT PELVIS WO CONTRAST  Result Date: 05/25/2020 CLINICAL DATA:  Findings suspicious for pubic symphysis fracture on the left on recent plain film EXAM: CT PELVIS WITHOUT CONTRAST TECHNIQUE: Multidetector CT imaging of the pelvis was performed following the standard protocol without intravenous contrast. COMPARISON:  Plain film from earlier in the same day. FINDINGS: Urinary Tract: Bladder is well distended. 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 Bowel: Visualized bowel is unremarkable. The appendix is within normal limits. Vascular/Lymphatic: Vascular calcifications are seen. No significant lymphadenopathy is noted. Multiple phleboliths are noted within the scrotum. Reproductive:  Prostate is within normal limits. Other:  None. Musculoskeletal: Bony structures show no acute fracture. The areas of abnormality in the pubic symphysis were artifactual  related to summation of shadows. Degenerative changes of lumbar spine and hip joints are seen. IMPRESSION: No acute fracture noted. 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 No other focal abnormality is seen. Electronically Signed   By: Inez Catalina M.D.   On: 05/25/2020 22:38   DG Chest Portable 1 View  Result Date: 05/25/2020 CLINICAL DATA:  Recent fall with chest pain, initial encounter EXAM: PORTABLE CHEST 1 VIEW COMPARISON:  09/03/2019 FINDINGS: Cardiac shadow is enlarged but accentuated by the frontal technique. Aortic calcifications are again noted. Lungs are hypoaerated bilaterally without focal  infiltrate or sizable effusion. No acute bony abnormality is noted. IMPRESSION: Poor inspiratory effort.  No acute abnormality noted. Electronically Signed   By: Inez Catalina M.D.   On: 05/25/2020 20:53   DG Hip Port Unilat W or Wo Pelvis 1 View Left  Result Date: 05/25/2020 CLINICAL DATA:  Recent fall with hip pain, initial encounter EXAM: DG HIP (WITH OR WITHOUT PELVIS) 3V PORT LEFT COMPARISON:  None. FINDINGS: There is lucency identified in the pubic symphysis on the left suspicious for undisplaced fracture. Proximal femur appears within normal limits. No soft tissue abnormality is noted. IMPRESSION: Lucency within the pubic symphysis on the left suspicious for undisplaced fracture. Electronically Signed   By: Inez Catalina M.D.   On: 05/25/2020 20:58    EKG: Independently reviewed.  Sinus rhythm, no acute ischemic changes.  Assessment/Plan Principal Problem:   Syncope Active Problems:   Back pain   UTI (urinary tract infection)   Acute metabolic encephalopathy   Diabetes ([place])   Unwitnessed fall/ concern for syncope Patient appears confused and has no recollection of what happened.  Neuro exam nonfocal and head CT negative for acute finding. High-sensitivity troponin mildly elevated 40 >52 without acute EKG changes.  ACS less likely as patient is not endorsing chest pain.  Does have a remote  history of PE and is chronically anticoagulated with Xarelto. -Hold home antihypertensives and check orthostatics.  Echocardiogram, EEG, and carotid Dopplers ordered.  Check D-dimer level.  Back pain/ left leg pain No lower extremity weakness or numbness.  Work-up done in the ED today is negative for hip or lumbar fracture.  MRI of lumbar spine done in September 2021 showing moderate spinal stenosis L3-4 with mild subarticular stenosis bilaterally. Moderate spinal stenosis L4-5. Severe subarticular stenosis on the left due to facet hypertrophy and left paracentral disc protrusion. Moderate left foraminal encroachment. Moderate subarticular stenosis on the right. 2 mm anterolisthesis L4-5 due to facet and disc degeneration. -Continue Lyrica.  Consider consulting neurosurgery in the morning.  UTI UA with large amount of leukocytes, greater than 50 WBCs, and few bacteria.  No fever, leukocytosis, or signs of sepsis.  CT renal stone study done 2 days ago did not show any acute finding. -Continue ceftriaxone.  Urine culture pending.  Acute metabolic encephalopathy Likely secondary to UTI.  Head CT negative for acute finding.  No focal neurodeficit. -Continue antibiotic for UTI.  Check TSH, B12, and ammonia levels.  Abdominal distention His abdomen appears significantly distended but is nontender.  KUB showing non obstructed bowel gas pattern.  Poorly controlled insulin-dependent type 2 diabetes with hyperglycemia Blood glucose elevated without signs of DKA. -Check A1c.  Continue home basal insulin.  Sliding scale insulin moderate ACHS.   Chronic diastolic heart failure Has pedal edema but lungs clear. -Hold diuretic at this time and monitor volume status closely.  OSA -Continue CPAP at night  CKD stage IIIa Stable.  Creatinine currently 1.4, baseline 1.2-1.4.  Hyperbilirubinemia T bili 1.7, remainder of LFTs normal.   -Check fractionated bilirubin levels  History of PE -Continue  Xarelto  GERD -Continue PPI  Hyperlipidemia -Continue Crestor  DVT prophylaxis: Xarelto Code Status: Full code Family Communication: No family available at this time. Disposition Plan: Status is: Observation  The patient remains OBS appropriate and will d/c before 2 midnights.  Dispo: The patient is from: Home              Anticipated d/c is to: Home  Patient currently is not medically stable to d/c.   Difficult to place patient No  Level of care: Level of care: Telemetry Cardiac   The medical decision making on this patient was of high complexity and the patient is at high risk for clinical deterioration, therefore this is a level 3 visit.  Shela Leff MD [facility]  If 7PM-7AM, please contact night-coverage [email or web address]  05/26/2020, 8:13 AM

## 2020-05-26 NOTE — ED Notes (Signed)
Pt has returned to room.

## 2020-05-26 NOTE — Progress Notes (Signed)
PT Cancellation Note  Patient Details Name: Steve Brown MRN: [id number] DOB: [birth year]   Cancelled Treatment:    Reason Eval/Treat Not Completed: Patient at procedure or test/unavailable Pt currently having EEG done. Will follow up as schedule allows.   Lou Miner, DPT  Acute Rehabilitation Services  Pager: [phone] Office: [phone]    Rudean Hitt 05/26/2020, 11:23 AM

## 2020-05-26 NOTE — Progress Notes (Signed)
PT Cancellation Note  Patient Details Name: Steve Brown MRN: [id number] DOB: April 01, 1940   Cancelled Treatment:    Reason Eval/Treat Not Completed: Medical issues which prohibited therapy - pt with abdominal distension and c/o 10/10 abdominal pain, awaiting CT. Will follow-up for PT Evaluation tomorrow.  Mabeline Caras, PT, DPT Acute Rehabilitation Services  Pager [phone] Office [place] 05/26/2020, 5:26 PM

## 2020-05-26 NOTE — Procedures (Signed)
Patient Name: Steve Brown  MRN: [id number]  Epilepsy Attending: Lora Havens  Referring Physician/Provider: Dr Derrick Ravel Date: 05/26/2020 Duration: 25 mins  Patient history: 80yo M with ams, syncope. EEG to evaluate for seizure.   Level of alertness: Awake, asleep  AEDs during EEG study: Pregabalin  Technical aspects: This EEG study was done with scalp electrodes positioned according to the 10-20 International system of electrode placement. Electrical activity was acquired at a sampling rate of 500Hz  and reviewed with a high frequency filter of 70Hz  and a low frequency filter of 1Hz . EEG data were recorded continuously and digitally stored.   Description: The posterior dominant rhythm consists of 8-9 Hz activity of moderate voltage (25-35 uV) seen predominantly in posterior head regions, symmetric and reactive to eye opening and eye closing. Sleep was characterized by vertex waves, sleep spindles (12 to 14 Hz), maximal frontocentral region. Hyperventilation and photic stimulation were not performed.     IMPRESSION: This study is within normal limits. No seizures or epileptiform discharges were seen throughout the recording.  Alee Gressman Barbra Sarks

## 2020-05-26 NOTE — ED Notes (Signed)
Pt transported to xray 

## 2020-05-27 ENCOUNTER — Telehealth: Payer: Self-pay | Admitting: Emergency Medicine

## 2020-05-27 ENCOUNTER — Ambulatory Visit: Payer: Medicare Other

## 2020-05-27 DIAGNOSIS — N179 Acute kidney failure, unspecified: Secondary | ICD-10-CM | POA: Diagnosis present

## 2020-05-27 DIAGNOSIS — M48061 Spinal stenosis, lumbar region without neurogenic claudication: Secondary | ICD-10-CM | POA: Diagnosis present

## 2020-05-27 DIAGNOSIS — Z20822 Contact with and (suspected) exposure to covid-19: Secondary | ICD-10-CM | POA: Diagnosis present

## 2020-05-27 DIAGNOSIS — Z91041 Radiographic dye allergy status: Secondary | ICD-10-CM | POA: Diagnosis not present

## 2020-05-27 DIAGNOSIS — Z86711 Personal history of pulmonary embolism: Secondary | ICD-10-CM | POA: Diagnosis not present

## 2020-05-27 DIAGNOSIS — E118 Type 2 diabetes mellitus with unspecified complications: Secondary | ICD-10-CM | POA: Diagnosis not present

## 2020-05-27 DIAGNOSIS — Z885 Allergy status to narcotic agent status: Secondary | ICD-10-CM | POA: Diagnosis not present

## 2020-05-27 DIAGNOSIS — K219 Gastro-esophageal reflux disease without esophagitis: Secondary | ICD-10-CM | POA: Diagnosis present

## 2020-05-27 DIAGNOSIS — Z7984 Long term (current) use of oral hypoglycemic drugs: Secondary | ICD-10-CM | POA: Diagnosis not present

## 2020-05-27 DIAGNOSIS — I69051 Hemiplegia and hemiparesis following nontraumatic subarachnoid hemorrhage affecting right dominant side: Secondary | ICD-10-CM | POA: Diagnosis not present

## 2020-05-27 DIAGNOSIS — M7989 Other specified soft tissue disorders: Secondary | ICD-10-CM | POA: Diagnosis not present

## 2020-05-27 DIAGNOSIS — Z888 Allergy status to other drugs, medicaments and biological substances status: Secondary | ICD-10-CM | POA: Diagnosis not present

## 2020-05-27 DIAGNOSIS — Z8673 Personal history of transient ischemic attack (TIA), and cerebral infarction without residual deficits: Secondary | ICD-10-CM | POA: Diagnosis not present

## 2020-05-27 DIAGNOSIS — M6281 Muscle weakness (generalized): Secondary | ICD-10-CM | POA: Diagnosis not present

## 2020-05-27 DIAGNOSIS — G8929 Other chronic pain: Secondary | ICD-10-CM | POA: Diagnosis not present

## 2020-05-27 DIAGNOSIS — R41841 Cognitive communication deficit: Secondary | ICD-10-CM | POA: Diagnosis not present

## 2020-05-27 DIAGNOSIS — G9341 Metabolic encephalopathy: Secondary | ICD-10-CM | POA: Diagnosis present

## 2020-05-27 DIAGNOSIS — Z7901 Long term (current) use of anticoagulants: Secondary | ICD-10-CM | POA: Diagnosis not present

## 2020-05-27 DIAGNOSIS — I130 Hypertensive heart and chronic kidney disease with heart failure and stage 1 through stage 4 chronic kidney disease, or unspecified chronic kidney disease: Secondary | ICD-10-CM | POA: Diagnosis present

## 2020-05-27 DIAGNOSIS — R17 Unspecified jaundice: Secondary | ICD-10-CM | POA: Diagnosis present

## 2020-05-27 DIAGNOSIS — E785 Hyperlipidemia, unspecified: Secondary | ICD-10-CM | POA: Diagnosis present

## 2020-05-27 DIAGNOSIS — R55 Syncope and collapse: Secondary | ICD-10-CM | POA: Diagnosis present

## 2020-05-27 DIAGNOSIS — N390 Urinary tract infection, site not specified: Secondary | ICD-10-CM | POA: Diagnosis present

## 2020-05-27 DIAGNOSIS — R278 Other lack of coordination: Secondary | ICD-10-CM | POA: Diagnosis not present

## 2020-05-27 DIAGNOSIS — N1831 Chronic kidney disease, stage 3a: Secondary | ICD-10-CM | POA: Diagnosis present

## 2020-05-27 DIAGNOSIS — R2681 Unsteadiness on feet: Secondary | ICD-10-CM | POA: Diagnosis not present

## 2020-05-27 DIAGNOSIS — M5442 Lumbago with sciatica, left side: Secondary | ICD-10-CM | POA: Diagnosis not present

## 2020-05-27 DIAGNOSIS — I2510 Atherosclerotic heart disease of native coronary artery without angina pectoris: Secondary | ICD-10-CM | POA: Diagnosis present

## 2020-05-27 DIAGNOSIS — R319 Hematuria, unspecified: Secondary | ICD-10-CM | POA: Diagnosis present

## 2020-05-27 DIAGNOSIS — I5032 Chronic diastolic (congestive) heart failure: Secondary | ICD-10-CM | POA: Diagnosis present

## 2020-05-27 DIAGNOSIS — G4733 Obstructive sleep apnea (adult) (pediatric): Secondary | ICD-10-CM | POA: Diagnosis present

## 2020-05-27 DIAGNOSIS — N400 Enlarged prostate without lower urinary tract symptoms: Secondary | ICD-10-CM | POA: Diagnosis present

## 2020-05-27 DIAGNOSIS — W19XXXA Unspecified fall, initial encounter: Secondary | ICD-10-CM | POA: Diagnosis present

## 2020-05-27 DIAGNOSIS — S39012A Strain of muscle, fascia and tendon of lower back, initial encounter: Secondary | ICD-10-CM | POA: Diagnosis present

## 2020-05-27 DIAGNOSIS — Z9861 Coronary angioplasty status: Secondary | ICD-10-CM | POA: Diagnosis not present

## 2020-05-27 LAB — GLUCOSE, CAPILLARY
Glucose-Capillary: 127 mg/dL — ABNORMAL HIGH (ref 70–99)
Glucose-Capillary: 238 mg/dL — ABNORMAL HIGH (ref 70–99)
Glucose-Capillary: 276 mg/dL — ABNORMAL HIGH (ref 70–99)
Glucose-Capillary: 262 mg/dL — ABNORMAL HIGH (ref 70–99)

## 2020-05-27 LAB — CBC
HCT: 37.1 % — ABNORMAL LOW (ref 39.0–52.0)
Hemoglobin: 12.7 g/dL — ABNORMAL LOW (ref 13.0–17.0)
MCH: 31.7 pg (ref 26.0–34.0)
MCHC: 34.2 g/dL (ref 30.0–36.0)
MCV: 92.5 fL (ref 80.0–100.0)
Platelets: 160 10*3/uL (ref 150–400)
RBC: 4.01 MIL/uL — ABNORMAL LOW (ref 4.22–5.81)
RDW: 12.8 % (ref 11.5–15.5)
WBC: 8.3 10*3/uL (ref 4.0–10.5)
nRBC: 0 % (ref 0.0–0.2)

## 2020-05-27 LAB — COMPREHENSIVE METABOLIC PANEL
ALT: 19 U/L (ref 0–44)
AST: 22 U/L (ref 15–41)
Albumin: 2.9 g/dL — ABNORMAL LOW (ref 3.5–5.0)
Alkaline Phosphatase: 47 U/L (ref 38–126)
Anion gap: 8 (ref 5–15)
BUN: 20 mg/dL (ref 8–23)
CO2: 24 mmol/L (ref 22–32)
Calcium: 8.5 mg/dL — ABNORMAL LOW (ref 8.9–10.3)
Chloride: 101 mmol/L (ref 98–111)
Creatinine, Ser: 1.18 mg/dL (ref 0.61–1.24)
GFR, Estimated: >60 mL/min (ref >60)
Glucose, Bld: 168 mg/dL — ABNORMAL HIGH (ref 70–99)
Potassium: 4 mmol/L (ref 3.5–5.1)
Sodium: 133 mmol/L — ABNORMAL LOW (ref 135–145)
Total Bilirubin: 0.8 mg/dL (ref 0.3–1.2)
Total Protein: 6.2 g/dL — ABNORMAL LOW (ref 6.5–8.1)

## 2020-05-27 LAB — HEPATIC FUNCTION PANEL
ALT: 19 U/L (ref 0–44)
AST: 23 U/L (ref 15–41)
Albumin: 2.9 g/dL — ABNORMAL LOW (ref 3.5–5.0)
Alkaline Phosphatase: 48 U/L (ref 38–126)
Bilirubin, Direct: 0.3 mg/dL — ABNORMAL HIGH (ref 0.0–0.2)
Indirect Bilirubin: 0.7 mg/dL (ref 0.3–0.9)
Total Bilirubin: 1 mg/dL (ref 0.3–1.2)
Total Protein: 6.5 g/dL (ref 6.5–8.1)

## 2020-05-27 LAB — URINE CULTURE: Culture: NO GROWTH

## 2020-05-27 LAB — LIPASE, BLOOD: Lipase: 37 U/L (ref 11–51)

## 2020-05-27 MED ORDER — BISACODYL 10 MG RE SUPP
10.0000 mg | Freq: Every day | RECTAL | Status: DC | PRN
Start: 2020-05-27 — End: 2020-06-01
  Filled 2020-05-27: qty 1

## 2020-05-27 MED ORDER — POLYETHYLENE GLYCOL 3350 17 G PO PACK
17.0000 g | PACK | Freq: Every day | ORAL | Status: DC
  Administered 2020-05-27 – 2020-05-31 (×5): 17 g via ORAL
  Filled 2020-05-27 (×5): qty 1

## 2020-05-27 MED ORDER — LIDOCAINE 5 % EX PTCH
1.0000 | MEDICATED_PATCH | CUTANEOUS | Status: DC
  Administered 2020-05-27 – 2020-05-31 (×3): 1 via TRANSDERMAL
  Filled 2020-05-27 (×4): qty 1

## 2020-05-27 MED ORDER — METHOCARBAMOL 500 MG PO TABS
500.0000 mg | ORAL_TABLET | Freq: Three times a day (TID) | ORAL | Status: DC | PRN
  Administered 2020-05-27 – 2020-05-31 (×5): 500 mg via ORAL
  Filled 2020-05-27 (×5): qty 1

## 2020-05-27 NOTE — Care Management Obs Status (Signed)
[facility] NOTIFICATION   Patient Details  Name: Steve Brown MRN: [id number] Date of Birth: 11-May-1940   Medicare Observation Status Notification Given:  Yes    Bethena Roys, RN 05/27/2020, 10:27 AM

## 2020-05-27 NOTE — Progress Notes (Signed)
Mobility Specialist - Progress Note   05/27/20 1639  Mobility  Activity Refused mobility   Requested by RN to see pt, pt stating he was recently unable to stand w/ PT and would like to stay in the chair longer. Will f/u as able.   Pricilla Handler Mobility Specialist Mobility Specialist Phone: [phone]

## 2020-05-27 NOTE — Care Management (Signed)
1407 05-27-20 Case Manager spoke with patient regarding disposition needs. Prior to arrival patient was from home alone. Patient has durable medical equipment cane, rolling walker, bedside commode and CPAP. Patient has PCP Dr. Harrington Challenger at [place] and patient states he drives and uses [facility] on [address]. Case Manager made patient aware that he would have PT/OT follow up for recommendations. Transitions of Care Team to follow for additional disposition needs. Bethena Roys, RN,BSN Case Manager

## 2020-05-27 NOTE — Telephone Encounter (Signed)
Post ED Visit - Positive Culture Follow-up  Culture report reviewed by antimicrobial stewardship pharmacist: [place] Team []  Elenor Quinones, Pharm.D. []  Heide Guile, Pharm.D., BCPS AQ-ID []  Parks Neptune, Pharm.D., BCPS []  Alycia Rossetti, Pharm.D., BCPS []  [place], Pharm.D., BCPS, AAHIVP []  Legrand Como, Pharm.D., BCPS, AAHIVP []  Salome Arnt, PharmD, BCPS []  Johnnette Gourd, PharmD, BCPS []  Hughes Better, PharmD, BCPS []  Leeroy Cha, PharmD []  Laqueta Linden, PharmD, BCPS []  Albertina Parr, PharmD  [place] Team []  Leodis Sias, PharmD []  Lindell Spar, PharmD []  Royetta Asal, PharmD []  Graylin Shiver, Rph []  Rema Fendt) Glennon Mac, PharmD []  Arlyn Dunning, PharmD []  Netta Cedars, PharmD []  Dia Sitter, PharmD []  Leone Haven, PharmD []  Gretta Arab, PharmD []  Theodis Shove, PharmD []  Peggyann Juba, PharmD []  Reuel Boom, PharmD   Positive urine culture Treated with cephalexin, symptom check, if none,continue current treatment with cephalexin, if new urinary symptoms stop keflex and start amoxicillin 500mg  po tid x 5 days Per Dr Cato Mulligan, pt states no urinary symptoms, pt to continue current treatment.  Hazle Nordmann 05/27/2020, 9:34 AM

## 2020-05-27 NOTE — Evaluation (Signed)
Physical Therapy Evaluation Patient Details Name: Steve Brown MRN: [id number] DOB: 10/28/1940 Today's Date: 05/27/2020   History of Present Illness  Pt is a 80 y.o. male admitted 05/25/20 after unwitnessed fall at home, c/o back and LLE pain, encephalopathy; of note, recently seen in ED 2 days prior with UTI. Imaging negative for acute injury. EEG 5/18 within normal limits. Abdominal CT negative for bowel obstruction; small hiatal hernia, concern for ascending UTI. PMH includes CHF, CAD s/p PCI, CKD 3, DM, obesity, OSA on CPAP.    Clinical Impression  Pt presents with an overall decrease in functional mobility secondary to above. PTA, pt lives alone, mod indep with RW or SPC, drives and reports exercising at [facility]. Today, pt unable to achieve standing despite maxA+1 although BLE strength functionally >/3/5 throughout; pt reports limited by lower back pain, stating "my body won't let me." Difficult to determine extent of true physical vs. cognitive impairment, as pt also demonstrates decreased awareness, slowed processing and poor attention. Pt would benefit from continued acute PT services to maximize functional mobility and independence prior to d/c with SNF-level therapies.     Follow Up Recommendations SNF;Supervision/Assistance - 24 hour    Equipment Recommendations   (TBD)    Recommendations for Other Services       Precautions / Restrictions Precautions Precautions: Fall Restrictions Weight Bearing Restrictions: No      Mobility  Bed Mobility               General bed mobility comments: Received sitting in recliner    Transfers Overall transfer level: Needs assistance Equipment used: Rolling walker (2 wheeled) Transfers: Sit to/from Stand           General transfer comment: Multiple attempts to stand from recliner to RW - pt with poor initiation, requiring max verbal cues for sequencing and hand placement - pt able to (somewhat) rock trunk forward with assist,  but with little to no BLE activiation with standing attempts; attempted to provide maxA for trunk elevation but pt saying, "no no, I just can't... my back hurts too much... my body won't let me"  Ambulation/Gait                Stairs            Wheelchair Mobility    Modified Rankin (Stroke Patients Only)       Balance Overall balance assessment: Needs assistance Sitting-balance support: Single extremity supported;Feet supported;No upper extremity supported Sitting balance-Leahy Scale: Fair Sitting balance - Comments: can maintain static sitting at edge of recliner without assist                                     Pertinent Vitals/Pain Pain Assessment: Faces Faces Pain Scale: Hurts whole lot Pain Location: low back Pain Descriptors / Indicators: Discomfort;Grimacing;Guarding Pain Intervention(s): Limited activity within patient's tolerance;Monitored during session;Premedicated before session    Home Living Family/patient expects to be discharged to:: Private residence Living Arrangements: Alone Available Help at Discharge: Family;Available PRN/intermittently Type of Home: Apartment Home Access: Stairs to enter Entrance Stairs-Rails: Chemical engineer of Steps: 16 Home Layout: One level Home Equipment: Walker - 2 wheels;Cane - single point;Shower seat Additional Comments: Reports daughter visits [facility] daily    Prior Function Level of Independence: Independent with assistive device(s)         Comments: Reports mod indep ambulating with SPC or RW. Drives. Exercises  with friends at [facility]        Extremity/Trunk Assessment   Upper Extremity Assessment Upper Extremity Assessment: Generalized weakness    Lower Extremity Assessment Lower Extremity Assessment: Generalized weakness       Communication      Cognition Arousal/Alertness: Awake/alert Behavior During Therapy: WFL for tasks  assessed/performed Overall Cognitive Status: No family/caregiver present to determine baseline cognitive functioning Area of Impairment: Attention;Memory;Following commands;Safety/judgement;Awareness;Problem solving                   Current Attention Level: Sustained Memory: Decreased short-term memory Following Commands: Follows one step commands inconsistently;Follows one step commands with increased time Safety/Judgement: Decreased awareness of safety;Decreased awareness of deficits Awareness: Emergent Problem Solving: Slow processing;Decreased initiation;Requires verbal cues;Difficulty sequencing General Comments: Pt slow to answer questions (at times) and follow commands. Pt with decreased attention, very slowed processing with simple commands. Unable to recall 3x therex practiced when asked almost immediately after performing      General Comments General comments (skin integrity, edema, etc.): Increased time discussing importance of being able to stand from recliner, although pt does not seem too concerned with the fact that he cannot this session (was modA to stand and pivot with OT earlier today) - pt ultimately reports, "my body won't let me right now." Pt agrees with recommendation [facility]acute [facility] at [facility]    Exercises General Exercises - Lower Extremity Long Arc Quad: AROM;Both;20 reps;Seated Hip Flexion/Marching: AROM;Both;20 reps;Seated Toe Raises: AROM;Both;10 reps;Seated Heel Raises: AROM;Both;20 reps;Seated   Assessment/Plan    PT Assessment Patient needs continued PT services  PT Problem List Decreased strength;Decreased activity tolerance;Decreased balance;Decreased mobility;Decreased cognition;Decreased knowledge of use of DME;Pain       PT Treatment Interventions DME instruction;Gait training;Functional mobility training;Stair training;Therapeutic activities;Therapeutic exercise;Balance training;Cognitive remediation;Patient/family education    PT  Goals (Current goals can be found in the Care Plan section)  Acute Rehab PT Goals Patient Stated Goal: agreeable to post-acute rehab at SNF PT Goal Formulation: With patient Time For Goal Achievement: 06/10/20 Potential to Achieve Goals: Good    Frequency Min 2X/week   Barriers to discharge        Co-evaluation               AM-PAC PT "6 Clicks" Mobility  Outcome Measure Help needed turning from your back to your side while in a flat bed without using bedrails?: A Lot Help needed moving from lying on your back to sitting on the side of a flat bed without using bedrails?: A Lot Help needed moving to and from a bed to a chair (including a wheelchair)?: Total Help needed standing up from a chair using your arms (e.g., wheelchair or bedside chair)?: Total Help needed to walk in hospital room?: Total Help needed climbing 3-5 steps with a railing? : Total 6 Click Score: 8    End of Session   Activity Tolerance: Patient limited by fatigue;Patient limited by pain Patient left: in chair;with call bell/phone within reach;with chair alarm set Nurse Communication: Mobility status PT Visit Diagnosis: Other abnormalities of gait and mobility (R26.89);Muscle weakness (generalized) (M62.81);Pain    Time: 5732-2025 PT Time Calculation (min) (ACUTE ONLY): 24 min   Charges:   PT Evaluation $PT Eval Moderate Complexity: [place], PT, DPT Acute Rehabilitation Services  Pager [phone] Office [place] 05/27/2020, 3:43 PM

## 2020-05-27 NOTE — TOC CAGE-AID Note (Signed)
Transition of Care [facility]) - CAGE-AID Screening   Patient Details  Name: Steve Brown MRN: [id number] Date of Birth: 1940/01/11  Clinical Narrative:  Patient denies current alcohol or drug use, no need for resources at this time.  CAGE-AID Screening:    Have You Ever Felt You Ought to Cut Down on Your Drinking or Drug Use?: No Have People Annoyed You By Critizing Your Drinking Or Drug Use?: No Have You Felt Bad Or Guilty About Your Drinking Or Drug Use?: No Have You Ever Had a Drink or Used Drugs First Thing In The Morning to Steady Your Nerves or to Get Rid of a Hangover?: No CAGE-AID Score: 0  Substance Abuse Education Offered: No

## 2020-05-27 NOTE — Progress Notes (Signed)
ED Antimicrobial Stewardship Positive Culture Follow Up   Steve Brown is an 81 y.o. male who presented to [facility] on 05/24/2020 with a chief complaint of  Chief Complaint  Patient presents with  . Back Pain    Recent Results (from the past 720 hour(s))  Urine culture     Status: Abnormal   Collection Time: 05/24/20  2:34 PM   Specimen: Urine, Clean Catch  Result Value Ref Range Status   Specimen Description   Final    URINE, CLEAN CATCH Performed at [facility], [address] [place], [place] [address]    Special Requests   Final    NONE Performed at [facility], [address] [place], [place] [address]    Culture (A)  Final    80,000 COLONIES/mL VIRIDANS STREPTOCOCCUS >=100,000 COLONIES/mL AEROCOCCUS SPECIES Standardized susceptibility testing for this organism is not available. Performed at [facility], [place] [address], [place],  [address]    Report Status 05/26/2020 FINAL  Final    [x]  Treated with cephalexin, organism not well covered by prescribed antimicrobial  Call patient for symptom check:  -If no urinary symptoms, continue current treatment -If new urinary symptoms, stop cephalexin and start amoxicillin 500 mg TID x5 days  ED Provider: Cato Mulligan, MD   Christy Gentles 05/27/2020, 9:00 AM Clinical Pharmacist Monday - Friday phone -  [phone] Saturday - Sunday phone - [phone]

## 2020-05-27 NOTE — Progress Notes (Signed)
PROGRESS NOTE    Steve Brown  [id number] DOB: [birth year] DOA: 05/25/2020 PCP: Lawerance Cruel, MD   Chief Complaint  Patient presents with  . Fall  Brief Narrative: 80 year old male with multiple complex comorbidities with CVA, insulin-dependent diabetes, CAD status post PCI, chronic diastolic CHF, OSA on CPAP, HTN, HLD, PE history on Xarelto, GERD, depression, CKD stage IIIa, BPH presented after a fall unwitnessed at home.  He was complaining of back pain left thigh/hip pain and brought to the ED after unwitnessed fall, patient unable to recollect.  Of note he was seen in the ED 2 days prior to admission diagnosed with UTI sent home on Keflex. Underwent extensive work-up in the ED-with abnormal UA WBC more than 50, urine culture sent, CK 46, slightly + trops 40> 52, but without chest pain.  Chest x-ray normal, x-ray of the pelvis left hip lucency within the pubic symphysis on the left suspicious for nondisplaced fracture, subsequently underwent CT pelvis negative for fracture also had CT lumbar spine, CT cervical thoracic and lumbar spine,CT head-no acute fracture or subluxation, postop changes see 3 through C7 fusion with corpectomy, chronic height loss at L4, mild spinal canal stenosis at L4-5.  Subjective: Seen this am, alert,awake oriented to self current date current president.   Appears to be forgetful at times.   Passing gas,no BM yet,c/o abdomen distention but no pain or nausea or vomiting has low back pain. Patient otherwise denies any chest pain, shortness of breath, fever, chills, headache, focal weakness,   Assessment & Plan:  Unwitnessed fall/concern for syncope:per daughter he fell Wednesday 3 am-patient had told her that he was trying to get up to go the bathroom and moved while moving from edge of bed he fell around 3 amd and she received call around 3.25. He had crawled to the bathroom and was on the floor when daughter arrived- EMS was called and brought back to  ED, he had visited on Tuesday and snt home on antibiotics. Suspect fall in the setting of UTI and dehydration and back pain,nonfocal on exam.  No chest pain had mildly positive troponin D-dimer 0.6 at this time no other evidence of PE 2D echocardiogram was done normal EF no other acute finding.EEG shows normal no epileptiform discharges.  Carotid duplex no significant stenosis.Check orthostatic,PT OT eval today.  Chronic back pain/left leg pain:Previous MRI in September 2021 with moderately spinal stenosis L3-L4, L4-L5 and severe subarticular stenosis on the left due to facet hypertrophy and left paracentral disc protrusion.  CT CS, thoracic and lumbar spine no acute finding.  Seen by neurosurgery at this time no need for acute intervention and will need outpatient follow-up-possible injection.  We will add lidocaine patch.Continue PT OT.  Add muscle relaxant.   UTI POA recently  on Keflex.urine cultures negative likely from being on antibiotics.  CT abdomen showed increasing bilateral perinephric stranding as well as some hazy stranding adjacent previously identified bladder diverticula concerning for possible ascending UTI. Continue on ceftriaxone,   Acute metabolic encephalopathy, unable to recollect fall currently his mental status appears stable at baseline appears somewhat forgetful.nonfocal on exam CT head no acute finding.   TSH B12, ammonia level normal.  Continue PT OT  Mildly borderline positive troponin,unclear etiology no chest pain no ischemic change on EKG D dimer 0.6-patient without chest pain shortness or tachycardia or any other signs of PE.  Corrected for his age D-dimer probably normal.  I discussed with patient extensively about this finding, patient feels comfortable and  after discussion does not want to proceed with CTA at this time, is already anticoagulated on Xarelto.Very low suspicion for PE.  Abdomen distension/versus obesity: Add MiraLAX, CT abdomen on repeat shows no evidence  of bowel obstruction.  Passing gas, no significant pain today.  Diet tolerated.  Insulin-dependent diabetes poorly controlled W/ hyperglycemia.  Hemoglobin A1c 7.5.  Continue sliding scale insulin/home Lantus, and monitor blood sugar. Recent Labs  Lab 05/26/20 1200 05/26/20 1630 05/26/20 2102 05/27/20 0739 05/27/20 1132  GLUCAP 243* 234* 214* 127* 238*   OSA on CPAP  Chronic diastolic CHF: Currently euvolemic.  No shortness of breath.  CAD history of PCI: He is on Xarelto and Crestor.  AKI on CKD stage IIIa, baseline creatinine 1.2.  Creatinine has improved to 1.1.  Monitor Recent Labs  Lab 05/25/20 2032 05/26/20 0728 05/27/20 0143  BUN 20 17 20   CREATININE 1.45* 1.17 1.18   Hyperbilirubinemia stable  GERD cont PPI  Hyperlipidemia-cont crestor  Morbid obesity w/ BMI 34: He will benefit with weight loss.  Diet Order            Diet heart healthy/carb modified Room service appropriate? Yes; Fluid consistency: Thin  Diet effective now               Patient's Body mass index is 34.38 kg/m.  DVT prophylaxis: XARELTO Code Status:   Code Status: Full Code  Family Communication:Plan of care discussed with patient at bedside. I called and updated his daughter and all the questions were answered.     Status is: Observation Remains hospitalized for ongoing management of his back pain, PTOT eval. Dispo: The patient is from: Home lives alone              Anticipated d/c is to: TBD, may needs SNF              Patient currently is not medically stable to d/c.   Difficult to place patient No   Unresulted Labs (From admission, onward)          Start     Ordered   05/27/20 0500  Comprehensive metabolic panel  Daily,   R      05/26/20 1048   05/27/20 0500  CBC  Daily,   R      05/26/20 1048        Medications reviewed:  Scheduled Meds: . insulin aspart  0-15 Units Subcutaneous TID WC  . insulin aspart  0-5 Units Subcutaneous QHS  . insulin glargine  28 Units  Subcutaneous Daily  . pantoprazole  40 mg Oral q morning  . polyethylene glycol  17 g Oral Daily  . pregabalin  300 mg Oral BID  . rivaroxaban  20 mg Oral Daily  . rosuvastatin  10 mg Oral Daily   Continuous Infusions: . cefTRIAXone (ROCEPHIN)  IV Stopped (05/26/20 2330)    Consultants:see note  Procedures:  TTE 5/18 Left ventricular ejection fraction, by estimation, is 60 to 65%. The  left ventricle has normal function. The left ventricle has no regional  wall motion abnormalities. Left ventricular diastolic parameters are  consistent with Grade I diastolic  dysfunction (impaired relaxation).  2. Right ventricular systolic function is normal. The right ventricular  size is normal. There is mildly elevated pulmonary artery systolic  pressure.  3. Left atrial size was mildly dilated.  4. Right atrial size was mildly dilated.  5. The mitral valve is normal in structure. No evidence of mitral valve  regurgitation. No evidence  of mitral stenosis.  6. The aortic valve is normal in structure. There is mild calcification  of the aortic valve. Aortic valve regurgitation is trivial. No aortic  stenosis is present.  7. The inferior vena cava is normal in size with greater than 50%  respiratory variability, suggesting right atrial pressure of 3 mmHg   Antimicrobials: Anti-infectives (From admission, onward)   Start     Dose/Rate Route Frequency Ordered Stop   05/26/20 2200  cefTRIAXone (ROCEPHIN) 1 g in sodium chloride 0.9 % 100 mL IVPB        1 g 200 mL/hr over 30 Minutes Intravenous Every 24 hours 05/26/20 0625     05/25/20 2300  cefTRIAXone (ROCEPHIN) 1 g in sodium chloride 0.9 % 100 mL IVPB        1 g 200 mL/hr over 30 Minutes Intravenous  Once 05/25/20 2246 05/25/20 2333     Culture/Microbiology    Component Value Date/Time   SDES URINE, RANDOM 05/25/2020 2156   [place] 2350 05/25/2020 2156   CULT  05/25/2020 2156    NO GROWTH Performed at [facility] [address] [place],  [address]    REPTSTATUS 05/27/2020 FINAL 05/25/2020 2156    Other culture-see note  Objective: Vitals: Today's Vitals   05/27/20 0346 05/27/20 0741 05/27/20 1000 05/27/20 1134  BP: (!) 155/89 (!) 164/83  135/67  Pulse:  66  70  Resp: 15 16  18   Temp: 98 F (36.7 C) 99.5 F (37.5 C)  99.2 F (37.3 C)  TempSrc: Oral Axillary  Oral  SpO2:  100%  92%  Weight:      Height:      PainSc:   8      Intake/Output Summary (Last 24 hours) at 05/27/2020 1139 Last data filed at 05/27/2020 0804 Gross per 24 hour  Intake 570 ml  Output 300 ml  Net 270 ml   Filed Weights   05/25/20 2035 05/26/20 1541  Weight: 97.5 kg 93.7 kg   Weight change: -3.823 kg  Intake/Output from previous day: 05/18 0701 - 05/19 0700 In: 330 [P.O.:330] Out: 300 [Urine:300] Intake/Output this shift: Total I/O In: 240 [P.O.:240] Out: -  Filed Weights   05/25/20 2035 05/26/20 1541  Weight: 97.5 kg 93.7 kg    Examination: General exam: AAO to self, place day, older than stated age, weak appearing. HEENT:Oral mucosa moist, Ear/Nose WNL grossly,dentition normal. Respiratory system: bilaterally diminished,NO CRACKLES no use of accessory muscle, non tender. Cardiovascular system: S1 & S2 +,no murmur No JVD. Gastrointestinal system: Abdomen soft,distended- non tender BS+. Nervous System:Alert, awake, moving extremities Extremities: no edema, distal peripheral pulses palpable.  Skin: No rashes,no icterus. MSK: Normal muscle bulk,tone, power  Data Reviewed: I have personally reviewed following labs and imaging studies CBC: Recent Labs  Lab 05/25/20 2032 05/27/20 0143  WBC 8.8 8.3  NEUTROABS 6.6  --   HGB 13.9 12.7*  HCT 41.4 37.1*  MCV 93.9 92.5  PLT 162 0000000   Basic Metabolic Panel: Recent Labs  Lab 05/25/20 2032 05/26/20 0728 05/27/20 0143  NA 134* 136 133*  K 4.3 4.2 4.0  CL 100 104 101  CO2 25 22 24   GLUCOSE 358* 251* 168*  BUN 20 17 20    CREATININE 1.45* 1.17 1.18  CALCIUM 9.2 8.6* 8.5*   GFR: Estimated Creatinine Clearance: 54.3 mL/min (by C-G formula based on SCr of 1.18 mg/dL). Liver Function Tests: Recent Labs  Lab 05/25/20 2032 05/26/20 0728 05/27/20 0143  AST 25  --  23  22  ALT 25  --  19  19  ALKPHOS 65  --  48  47  BILITOT 1.7* 1.1 1.0  0.8  PROT 7.5  --  6.5  6.2*  ALBUMIN 3.7  --  2.9*  2.9*   Recent Labs  Lab 05/27/20 0143  LIPASE 37   Recent Labs  Lab 05/26/20 0728  AMMONIA 29   Coagulation Profile: No results for input(s): INR, PROTIME in the last 168 hours. Cardiac Enzymes: Recent Labs  Lab 05/25/20 2230  CKTOTAL 406*   BNP (last 3 results) No results for input(s): PROBNP in the last 8760 hours. HbA1C: Recent Labs    05/26/20 0914  HGBA1C 7.5*   CBG: Recent Labs  Lab 05/26/20 1200 05/26/20 1630 05/26/20 2102 05/27/20 0739 05/27/20 1132  GLUCAP 243* 234* 214* 127* 238*   Lipid Profile: No results for input(s): CHOL, HDL, LDLCALC, TRIG, CHOLHDL, LDLDIRECT in the last 72 hours. Thyroid Function Tests: Recent Labs    05/26/20 0728  TSH 1.731   Anemia Panel: Recent Labs    05/26/20 0728  VITAMINB12 480   Sepsis Labs: No results for input(s): PROCALCITON, LATICACIDVEN in the last 168 hours.  Recent Results (from the past 240 hour(s))  Resp Panel by RT-PCR (Flu A&B, Covid) Nasopharyngeal Swab     Status: None   Collection Time: 05/25/20  9:38 PM   Specimen: Nasopharyngeal Swab; Nasopharyngeal(NP) swabs in vial transport medium  Result Value Ref Range Status   SARS Coronavirus 2 by RT PCR NEGATIVE NEGATIVE Final    Comment: (NOTE) SARS-CoV-2 target nucleic acids are NOT DETECTED.  The SARS-CoV-2 RNA is generally detectable in upper respiratory specimens during the acute phase of infection. The lowest concentration of SARS-CoV-2 viral copies this assay can detect is 138 copies/mL. A negative result does not preclude SARS-Cov-2 infection and should not  be used as the sole basis for treatment or other patient management decisions. A negative result may occur with  improper specimen collection/handling, submission of specimen other than nasopharyngeal swab, presence of viral mutation(s) within the areas targeted by this assay, and inadequate number of viral copies(<138 copies/mL). A negative result must be combined with clinical observations, patient history, and epidemiological information. The expected result is Negative.  Fact Sheet for Patients:  EntrepreneurPulse.com.au  Fact Sheet for Healthcare Providers:  IncredibleEmployment.be  This test is no t yet approved or cleared by the [place] FDA and  has been authorized for detection and/or diagnosis of SARS-CoV-2 by FDA under an Emergency Use Authorization (EUA). This EUA will remain  in effect (meaning this test can be used) for the duration of the COVID-19 declaration under Section 564(b)(1) of the Act, 21 U.S.C.section 360bbb-3(b)(1), unless the authorization is terminated  or revoked sooner.       Influenza A by PCR NEGATIVE NEGATIVE Final   Influenza B by PCR NEGATIVE NEGATIVE Final    Comment: (NOTE) The Xpert Xpress SARS-CoV-2/FLU/RSV plus assay is intended as an aid in the diagnosis of influenza from Nasopharyngeal swab specimens and should not be used as a sole basis for treatment. Nasal washings and aspirates are unacceptable for Xpert Xpress SARS-CoV-2/FLU/RSV testing.  Fact Sheet for Patients: EntrepreneurPulse.com.au  Fact Sheet for Healthcare Providers: IncredibleEmployment.be  This test is not yet approved or cleared by the [place] FDA and has been authorized for detection and/or diagnosis of SARS-CoV-2 by FDA under an Emergency Use Authorization (EUA). This EUA will remain in effect (  meaning this test can be used) for the duration of the COVID-19 declaration under Section  564(b)(1) of the Act, 21 U.S.C. section 360bbb-3(b)(1), unless the authorization is terminated or revoked.  Performed at [facility], [place] [address], [place], [place] [address]   Urine culture     Status: None   Collection Time: 05/25/20  9:56 PM   Specimen: Urine, Random  Result Value Ref Range Status   Specimen Description URINE, RANDOM  Final   Special Requests ADDED 2350  Final   Culture   Final    NO GROWTH Performed at [facility], [place] [address] [place], [place] [address]    Report Status 05/27/2020 FINAL  Final     Radiology Studies: CT ABDOMEN PELVIS WO CONTRAST  Result Date: 05/26/2020 CLINICAL DATA:  Abdominal distension EXAM: CT ABDOMEN AND PELVIS WITHOUT CONTRAST TECHNIQUE: Multidetector CT imaging of the abdomen and pelvis was performed following the standard protocol without IV contrast. COMPARISON:  CT renal colic 32/44/0102 FINDINGS: Lower chest: Mild cardiomegaly. Three-vessel coronary artery atherosclerosis. Distal thoracic aortic atherosclerosis. Small hiatal hernia and hyperdense contrast media present within the distal thoracic esophagus. Bibasilar atelectasis with additional bandlike areas of scarring or subsegmental atelectasis. No visible effusion. Hepatobiliary: No visible focal liver lesion within limitations of an unenhanced CT. Normal hepatic attenuation. Smooth surface contour. Prior cholecystectomy. No visible calcified gallstones or significant biliary ductal dilatation. Pancreas: Partial fatty replacement of the pancreas. No pancreatic ductal dilatation or surrounding inflammatory changes. Spleen: Normal in size. No concerning splenic lesions. Adrenals/Urinary Tract: Normal adrenal glands. Bilateral renal cortical atrophy. Increased bilateral perinephric stranding when compared to recent imaging performed 05/24/2020. Few fluid attenuation cysts are again noted. No concerning visible or contour deforming renal lesions. No urolithiasis or  hydronephrosis. Left posterolateral bladder diverticulum is noted, some hazy stranding is noted adjacent this diverticulum, not as clearly evident on comparison. The indentation of bladder base by an enlarged prostate. Stomach/Bowel: Small hiatal hernia and high attenuation contrast media retained within the distal thoracic esophagus. Contrast media has traversed to the level of the transverse colon. No evidence of bowel obstruction. No conspicuous large or small thickening or dilatation. Contrast media opacifies a normal caliber noninflamed appendix in the right lower quadrant as well. Vascular/Lymphatic: Atherosclerotic calcifications within the abdominal aorta and branch vessels. No aneurysm or ectasia. No enlarged abdominopelvic lymph nodes. Reproductive: The prostate and seminal vesicles are unremarkable. Multiple calcified phleboliths in the pelvis and along the spermatic cords. Other: No abdominopelvic free air or fluid no bowel containing hernia. Abdominal wall laxity is similar to prior. Musculoskeletal: Multilevel degenerative changes are present in the imaged portions of the spine. Additional degenerative changes in both hips and pelvis. Partial fusion across the SI joints. No concerning lytic or blastic lesions or other acute osseous abnormalities. IMPRESSION: 1. No evidence of high-grade bowel obstruction or other acute bowel abnormality. Abdominal wall laxity is not significantly changed from comparison. The 2. Increasing bilateral perinephric stranding as well as some hazy stranding adjacent a previously identified bladder diverticula. Findings are concerning for possible ascending urinary tract infection. Background bilateral renal atrophy/scarring. 3. Indentation of the bladder base by an enlarged prostate could reflect some chronic outlet obstruction correlate with clinical symptoms. 4. Small hiatal hernia with high attenuation contrast media retained or refluxed into the distal thoracic esophagus,  correlate for clinical symptoms. 5. Borderline cardiomegaly.  Coronary artery atherosclerosis. 6.  Aortic Atherosclerosis (ICD10-I70.0). 7. Mild body wall edema, correlate with fluid status. Electronically  Signed   By: Lovena Le M.D.   On: 05/26/2020 21:59   DG Abd 1 View  Result Date: 05/26/2020 CLINICAL DATA:  80 year old male with abdominal distension. EXAM: ABDOMEN - 1 VIEW COMPARISON:  CT Abdomen and Pelvis 05/24/2020. FINDINGS: AP view at 0635 hours. Non obstructed bowel gas pattern. Stable cholecystectomy clips. Partially visible surgical clips about the prostate. Pelvic phleboliths. No acute osseous abnormality identified. IMPRESSION: Non obstructed bowel gas pattern. Electronically Signed   By: Genevie Ann M.D.   On: 05/26/2020 07:04   CT Head Wo Contrast  Result Date: 05/25/2020 CLINICAL DATA:  Status post fall. EXAM: CT HEAD WITHOUT CONTRAST TECHNIQUE: Contiguous axial images were obtained from the base of the skull through the vertex without intravenous contrast. COMPARISON:  September 03, 2019 FINDINGS: Brain: There is mild cerebral atrophy with widening of the extra-axial spaces and ventricular dilatation. There are areas of decreased attenuation within the white matter tracts of the supratentorial brain, consistent with microvascular disease changes. Vascular: No hyperdense vessel or unexpected calcification. Skull: Normal. Negative for fracture or focal lesion. Sinuses/Orbits: There is stable marked severity posterior left ethmoid sinus and sphenoid sinus mucosal thickening. Other: A 2.8 cm x 0.6 cm left frontal scalp lipoma is noted (approximately -108.63 Hounsfield units). IMPRESSION: 1. Generalized cerebral atrophy. 2. No acute intracranial abnormality. 3. Chronic left ethmoid sinus and sphenoid sinus disease. Electronically Signed   By: Virgina Norfolk M.D.   On: 05/25/2020 21:26   CT Cervical Spine Wo Contrast  Result Date: 05/25/2020 CLINICAL DATA:  Status post fall. EXAM: CT CERVICAL  SPINE WITHOUT CONTRAST TECHNIQUE: Multidetector CT imaging of the cervical spine was performed without intravenous contrast. Multiplanar CT image reconstructions were also generated. COMPARISON:  September 03, 2019 FINDINGS: Alignment: Normal. Skull base and vertebrae: No acute fracture. A radiopaque fixation plate and fixation screws are seen along the anterior aspect of the C3, C4, C5, C6 and C7 levels. Arthrodesis is also seen throughout the fused levels. These areas are unchanged in appearance and position. Associated streak artifact is noted with subsequently limited evaluation of the adjacent osseous and soft tissue structures. Soft tissues and spinal canal: No prevertebral fluid or swelling. No visible canal hematoma. Disc levels: Stable moderate to marked severity endplate sclerosis and associated intervertebral disc space narrowing is noted at the levels of C6-C7 and C7-T1. Bilateral moderate severity multilevel facet joint hypertrophy is noted. Upper chest: Negative. Other: None. IMPRESSION: 1. Stable extensive postoperative changes consistent with C3 through C7 fusion with corpectomy. 2. No acute cervical spine fracture. 3. Stable degenerative changes at the levels of C6-C7 and C7-T1. Electronically Signed   By: Virgina Norfolk M.D.   On: 05/25/2020 21:31   CT Thoracic Spine Wo Contrast  Result Date: 05/25/2020 CLINICAL DATA:  Fall EXAM: CT THORACIC AND LUMBAR SPINE WITHOUT CONTRAST TECHNIQUE: Multidetector CT imaging of the thoracic and lumbar spine was performed without contrast. Multiplanar CT image reconstructions were also generated. COMPARISON:  Lumbar spine CT 09/03/2019 FINDINGS: CT THORACIC SPINE FINDINGS Alignment: Normal. Vertebrae: No acute fracture or focal pathologic process. Paraspinal and other soft tissues: Calcific aortic atherosclerosis. Disc levels: No bony spinal canal stenosis. CT LUMBAR SPINE FINDINGS Segmentation: 5 lumbar type vertebrae. Alignment: Normal. Vertebrae: No acute  fracture or focal pathologic process. Chronic height loss at L4 is unchanged. Paraspinal and other soft tissues: Negative Disc levels: Mild spinal canal stenosis at L4-5. IMPRESSION: 1. No acute fracture or static subluxation of the thoracic or lumbar spine. 2.  Chronic height loss at L4, unchanged. 3. Mild spinal canal stenosis at L4-5. Aortic Atherosclerosis (ICD10-I70.0). Electronically Signed   By: Ulyses Jarred M.D.   On: 05/25/2020 21:51   CT Lumbar Spine Wo Contrast  Result Date: 05/25/2020 CLINICAL DATA:  Fall EXAM: CT THORACIC AND LUMBAR SPINE WITHOUT CONTRAST TECHNIQUE: Multidetector CT imaging of the thoracic and lumbar spine was performed without contrast. Multiplanar CT image reconstructions were also generated. COMPARISON:  Lumbar spine CT 09/03/2019 FINDINGS: CT THORACIC SPINE FINDINGS Alignment: Normal. Vertebrae: No acute fracture or focal pathologic process. Paraspinal and other soft tissues: Calcific aortic atherosclerosis. Disc levels: No bony spinal canal stenosis. CT LUMBAR SPINE FINDINGS Segmentation: 5 lumbar type vertebrae. Alignment: Normal. Vertebrae: No acute fracture or focal pathologic process. Chronic height loss at L4 is unchanged. Paraspinal and other soft tissues: Negative Disc levels: Mild spinal canal stenosis at L4-5. IMPRESSION: 1. No acute fracture or static subluxation of the thoracic or lumbar spine. 2. Chronic height loss at L4, unchanged. 3. Mild spinal canal stenosis at L4-5. Aortic Atherosclerosis (ICD10-I70.0). Electronically Signed   By: Ulyses Jarred M.D.   On: 05/25/2020 21:51   CT PELVIS WO CONTRAST  Result Date: 05/25/2020 CLINICAL DATA:  Findings suspicious for pubic symphysis fracture on the left on recent plain film EXAM: CT PELVIS WITHOUT CONTRAST TECHNIQUE: Multidetector CT imaging of the pelvis was performed following the standard protocol without intravenous contrast. COMPARISON:  Plain film from earlier in the same day. FINDINGS: Urinary Tract:  Bladder is well distended. 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 Bowel: Visualized bowel is unremarkable. The appendix is within normal limits. Vascular/Lymphatic: Vascular calcifications are seen. No significant lymphadenopathy is noted. Multiple phleboliths are noted within the scrotum. Reproductive:  Prostate is within normal limits. Other:  None. Musculoskeletal: Bony structures show no acute fracture. The areas of abnormality in the pubic symphysis were artifactual related to summation of shadows. Degenerative changes of lumbar spine and hip joints are seen. IMPRESSION: No acute fracture noted. 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 No other focal abnormality is seen. Electronically Signed   By: Inez Catalina M.D.   On: 05/25/2020 22:38   DG Chest Portable 1 View  Result Date: 05/25/2020 CLINICAL DATA:  Recent fall with chest pain, initial encounter EXAM: PORTABLE CHEST 1 VIEW COMPARISON:  09/03/2019 FINDINGS: Cardiac shadow is enlarged but accentuated by the frontal technique. Aortic calcifications are again noted. Lungs are hypoaerated bilaterally without focal infiltrate or sizable effusion. No acute bony abnormality is noted. IMPRESSION: Poor inspiratory effort.  No acute abnormality noted. Electronically Signed   By: Inez Catalina M.D.   On: 05/25/2020 20:53   EEG adult  Result Date: 05/26/2020 Lora Havens, MD     05/26/2020 11:47 AM Patient Name: Steve Brown MRN: [id number] Epilepsy Attending: Lora Havens Referring Physician/Provider: Dr Derrick Ravel Date: 05/26/2020 Duration: 25 mins Patient history: 80yo M with ams, syncope. EEG to evaluate for seizure. Level of alertness: Awake, asleep AEDs during EEG study: [employer] Technical aspects: This EEG study was done with scalp electrodes positioned according to the 10-20 International system of electrode placement. Electrical activity was acquired at a sampling rate of 500Hz  and reviewed with a high frequency  filter of 70Hz  and a low frequency filter of 1Hz . EEG data were recorded continuously and digitally stored. Description: The posterior dominant rhythm consists of 8-9 Hz activity of moderate voltage (25-35 uV) seen predominantly in posterior head regions, symmetric and reactive to eye opening and eye  closing. Sleep was characterized by vertex waves, sleep spindles (12 to 14 Hz), maximal frontocentral region. Hyperventilation and photic stimulation were not performed.   IMPRESSION: This study is within normal limits. No seizures or epileptiform discharges were seen throughout the recording. Lora Havens   ECHOCARDIOGRAM COMPLETE  Result Date: 05/26/2020    ECHOCARDIOGRAM REPORT   Patient Name:   Steve Brown Date of Exam: 05/26/2020 Medical Rec #:  [id number]        Height:       65.0 in Accession #:    [id number]       Weight:       215.0 lb Date of Birth:  01-31-1940         BSA:          2.040 m Patient Age:    74 years         BP:           151/80 mmHg Patient Gender: M                HR:           66 bpm. Exam Location:  Inpatient Procedure: 2D Echo, Cardiac Doppler and Color Doppler Indications:    Syncope  History:        Patient has no prior history of Echocardiogram examinations.                 CAD, Stroke and COPD, Signs/Symptoms:Syncope; Risk                 Factors:Diabetes, Hypertension, Dyslipidemia and Sleep Apnea.                 CKD, pul.embolus.  Sonographer:    Dustin Flock Referring Phys: [id number] [place]  1. Left ventricular ejection fraction, by estimation, is 60 to 65%. The left ventricle has normal function. The left ventricle has no regional wall motion abnormalities. Left ventricular diastolic parameters are consistent with Grade I diastolic dysfunction (impaired relaxation).  2. Right ventricular systolic function is normal. The right ventricular size is normal. There is mildly elevated pulmonary artery systolic pressure.  3. Left atrial size was mildly  dilated.  4. Right atrial size was mildly dilated.  5. The mitral valve is normal in structure. No evidence of mitral valve regurgitation. No evidence of mitral stenosis.  6. The aortic valve is normal in structure. There is mild calcification of the aortic valve. Aortic valve regurgitation is trivial. No aortic stenosis is present.  7. The inferior vena cava is normal in size with greater than 50% respiratory variability, suggesting right atrial pressure of 3 mmHg. FINDINGS  Left Ventricle: Left ventricular ejection fraction, by estimation, is 60 to 65%. The left ventricle has normal function. The left ventricle has no regional wall motion abnormalities. The left ventricular internal cavity size was normal in size. There is  no left ventricular hypertrophy. Left ventricular diastolic parameters are consistent with Grade I diastolic dysfunction (impaired relaxation). Right Ventricle: The right ventricular size is normal. No increase in right ventricular wall thickness. Right ventricular systolic function is normal. There is mildly elevated pulmonary artery systolic pressure. The tricuspid regurgitant velocity is 2.93  m/s, and with an assumed right atrial pressure of 3 mmHg, the estimated right ventricular systolic pressure is 123XX123 mmHg. Left Atrium: Left atrial size was mildly dilated. Right Atrium: Right atrial size was mildly dilated. Pericardium: There is no evidence of pericardial effusion. Mitral Valve: The mitral valve is normal  in structure. No evidence of mitral valve regurgitation. No evidence of mitral valve stenosis. Tricuspid Valve: The tricuspid valve is normal in structure. Tricuspid valve regurgitation is trivial. No evidence of tricuspid stenosis. Aortic Valve: The aortic valve is normal in structure. There is mild calcification of the aortic valve. Aortic valve regurgitation is trivial. No aortic stenosis is present. Pulmonic Valve: The pulmonic valve was normal in structure. Pulmonic valve  regurgitation is trivial. No evidence of pulmonic stenosis. Aorta: The aortic root is normal in size and structure. Venous: The inferior vena cava is normal in size with greater than 50% respiratory variability, suggesting right atrial pressure of 3 mmHg. IAS/Shunts: No atrial level shunt detected by color flow Doppler.  LEFT VENTRICLE PLAX 2D LVIDd:         4.10 cm  Diastology LVIDs:         2.80 cm  LV e' medial:    4.57 cm/s LV PW:         1.50 cm  LV E/e' medial:  11.2 LV IVS:        1.60 cm  LV e' lateral:   4.24 cm/s LVOT diam:     2.20 cm  LV E/e' lateral: 12.0 LV SV:         62 LV SV Index:   31 LVOT Area:     3.80 cm  RIGHT VENTRICLE RV Basal diam:  3.20 cm RV S prime:     5.98 cm/s TAPSE (M-mode): 2.2 cm LEFT ATRIUM             Index       RIGHT ATRIUM           Index LA diam:        3.90 cm 1.91 cm/m  RA Area:     20.00 cm LA Vol (A2C):   59.7 ml 29.27 ml/m RA Volume:   53.80 ml  26.37 ml/m LA Vol (A4C):   30.9 ml 15.15 ml/m LA Biplane Vol: 47.5 ml 23.29 ml/m  AORTIC VALVE LVOT Vmax:   80.90 cm/s LVOT Vmean:  54.100 cm/s LVOT VTI:    0.164 m  AORTA Ao Root diam: 3.20 cm MITRAL VALVE               TRICUSPID VALVE MV Area (PHT): 3.12 cm    TR Peak grad:   34.3 mmHg MV Decel Time: 243 msec    TR Vmax:        293.00 cm/s MV E velocity: 51.00 cm/s MV A velocity: 75.40 cm/s  SHUNTS MV E/A ratio:  0.68        Systemic VTI:  0.16 m                            Systemic Diam: 2.20 cm Glori Bickers MD Electronically signed by Glori Bickers MD Signature Date/Time: 05/26/2020/2:40:51 PM    Final    DG Hip Port Unilat W or Wo Pelvis 1 View Left  Result Date: 05/25/2020 CLINICAL DATA:  Recent fall with hip pain, initial encounter EXAM: DG HIP (WITH OR WITHOUT PELVIS) 3V PORT LEFT COMPARISON:  None. FINDINGS: There is lucency identified in the pubic symphysis on the left suspicious for undisplaced fracture. Proximal femur appears within normal limits. No soft tissue abnormality is noted. IMPRESSION:  Lucency within the pubic symphysis on the left suspicious for undisplaced fracture. Electronically Signed   By: Inez Catalina M.D.   On: 05/25/2020  20:58   VAS US CAROTID  Result Date: 05/26/2020 Carotid Arterial Duplex Study Patient Name:  Steve Brown  Date of Exam:   05/26/2020 Medical Rec #: [id number]         Accession #:    [id number] Date of Birth: 02-Sep-1940          Patient Gender: M Patient Age:   80Y Exam Location:  [facility] Procedure:      VAS US CAROTID Referring Phys: [id number] VASUNDHRA RATHORE --------------------------------------------------------------------------------  Indications:      Syncope. Risk Factors:     Hypertension, hyperlipidemia, Diabetes, coronary artery                   disease. Comparison Study: No prior studies. Performing Technologist: Darlin Coco RDMS,RVT  Examination Guidelines: A complete evaluation includes B-mode imaging, spectral Doppler, color Doppler, and power Doppler as needed of all accessible portions of each vessel. Bilateral testing is considered an integral part of a complete examination. Limited examinations for reoccurring indications may be performed as noted.  Right Carotid Findings: +----------+--------+--------+--------+------------------+--------+           PSV cm/sEDV cm/sStenosisPlaque DescriptionComments +----------+--------+--------+--------+------------------+--------+ CCA Prox  74      12                                         +----------+--------+--------+--------+------------------+--------+ CCA Distal85      17              calcific                   +----------+--------+--------+--------+------------------+--------+ ICA Prox  90      18      1-39%   calcific                   +----------+--------+--------+--------+------------------+--------+ ICA Distal83      24                                         +----------+--------+--------+--------+------------------+--------+ ECA       98      13                                          +----------+--------+--------+--------+------------------+--------+ +----------+--------+-------+----------------+-------------------+           PSV cm/sEDV cmsDescribe        Arm Pressure (mmHG) +----------+--------+-------+----------------+-------------------+ [id number]            Multiphasic, WNL                    +----------+--------+-------+----------------+-------------------+ +---------+--------+--+--------+--+---------+ VertebralPSV cm/s71EDV cm/s16Antegrade +---------+--------+--+--------+--+---------+  Left Carotid Findings: +----------+--------+--------+--------+----------------------------+--------+           PSV cm/sEDV cm/sStenosisPlaque Description          Comments +----------+--------+--------+--------+----------------------------+--------+ CCA Prox  99      16                                                   +----------+--------+--------+--------+----------------------------+--------+ CCA Mid   153     22      <  50%    hyperechoic and heterogenous         +----------+--------+--------+--------+----------------------------+--------+ CCA Distal80      15                                                   +----------+--------+--------+--------+----------------------------+--------+ ICA Prox  79      11      1-39%   calcific                             +----------+--------+--------+--------+----------------------------+--------+ ICA Distal93      27                                                   +----------+--------+--------+--------+----------------------------+--------+ ECA       68                                                           +----------+--------+--------+--------+----------------------------+--------+ +----------+--------+--------+----------------+-------------------+           PSV cm/sEDV cm/sDescribe        Arm Pressure (mmHG)  +----------+--------+--------+----------------+-------------------+ [id number]             Multiphasic, WNL                    +----------+--------+--------+----------------+-------------------+ +---------+--------+--+--------+--+---------+ VertebralPSV cm/s45EDV cm/s23Antegrade +---------+--------+--+--------+--+---------+   Summary: Right Carotid: Velocities in the right ICA are consistent with a 1-39% stenosis. Left Carotid: Velocities in the left ICA are consistent with a 1-39% stenosis.               Non-hemodynamically significant plaque <50% noted in the CCA. Vertebrals:  Bilateral vertebral arteries demonstrate antegrade flow. Subclavians: Normal flow hemodynamics were seen in bilateral subclavian              arteries. *See table(s) above for measurements and observations.  Electronically signed by Antony Contras MD on 05/26/2020 at 4:29:08 PM.    Final      LOS: 0 days   Antonieta Pert, MD [facility]  05/27/2020, 11:39 AM

## 2020-05-27 NOTE — TOC Initial Note (Signed)
Transition of Care [facility]) - Initial/Assessment Note    Patient Details  Name: Steve Brown MRN: [id number] Date of Birth: 08-06-1940  Transition of Care [facility]) CM/SW Contact:    Trula Ore, [place] Phone Number: 05/27/2020, 4:55 PM  Clinical Narrative:                  CSW received consult for possible SNF placement at time of discharge. CSW spoke with patient at bedside regarding PT recommendation of SNF placement at time of discharge.Patient comes from home alone.Patient expressed understanding of PT recommendation and is agreeable to SNF placement at time of discharge. Patient gave CSW permission to fax out initial referral near the [place] area.  Patient has received the COVID vaccines as well as booster.  No further questions reported at this time. CSW to continue to follow and assist with discharge planning needs.   Expected Discharge Plan: Skilled Nursing Facility Barriers to Discharge: No Barriers Identified   Patient Goals and CMS Choice Patient states their goals for this hospitalization and ongoing recovery are:: to go to SNF CMS Medicare.gov Compare Post Acute Care list provided to:: Patient Choice offered to / list presented to : Patient  Expected Discharge Plan and Services Expected Discharge Plan: [place] In-house Referral: Clinical Social Work                                            Prior Living Arrangements/Services   Lives with:: Self Patient language and need for interpreter reviewed:: Yes Do you feel safe going back to the place where you live?: No   SNF  Need for Family Participation in Patient Care: Yes (Comment) Care giver support system in place?: Yes (comment)   Criminal Activity/Legal Involvement Pertinent to Current Situation/Hospitalization: No - Comment as needed  Activities of Daily Living Home Assistive Devices/Equipment: Walker (specify type) ADL Screening (condition at time of admission) Patient's  cognitive ability adequate to safely complete daily activities?: Yes Is the patient deaf or have difficulty hearing?: No Does the patient have difficulty seeing, even when wearing glasses/contacts?: No Does the patient have difficulty concentrating, remembering, or making decisions?: No Patient able to express need for assistance with ADLs?: No Does the patient have difficulty dressing or bathing?: No Independently performs ADLs?: Yes (appropriate for developmental age) Does the patient have difficulty walking or climbing stairs?: Yes Weakness of Legs: Both Weakness of Arms/Hands: Both  Permission Sought/Granted Permission sought to share information with : Case Manager,Family Chief Financial Officer Permission granted to share information with : Yes, Verbal Permission Granted  Share Information with NAME: [name]  Permission granted to share info w AGENCY: SNF  Permission granted to share info w Relationship: daughter  Permission granted to share info w Contact Information: Jonelle Sidle [phone]  Emotional Assessment Appearance:: Appears stated age Attitude/Demeanor/Rapport: Gracious Affect (typically observed): Calm Orientation: : Oriented to Self,Oriented to Place,Oriented to Situation Alcohol / Substance Use: Not Applicable Psych Involvement: No (comment)  Admission diagnosis:  Syncope and collapse [R55] Abdominal distention [R14.0] Syncope [R55] Troponin level elevated [R77.8] Fall, initial encounter [W19.XXXA] Acute hip pain, left [M25.552] Urinary tract infection with hematuria, site unspecified [N39.0, R31.9] Acute renal failure, unspecified acute renal failure type ([place]) [N17.9] Acute myofascial strain of lumbar region, initial encounter [S39.012A] Back pain [M54.9] Patient Active Problem List   Diagnosis Date Noted  . Syncope 05/26/2020  . Back  pain 05/26/2020  . UTI (urinary tract infection) 05/26/2020  . Acute metabolic encephalopathy 37/62/8315   . Diabetes ([place]) 05/26/2020   PCP:  Lawerance Cruel, MD Pharmacy:   [id number] - [place], [place] [address] Phone: [phone] Fax: [phone]     Social Determinants of Health (SDOH) Interventions    Readmission Risk Interventions No flowsheet data found.

## 2020-05-27 NOTE — Evaluation (Signed)
Occupational Therapy Evaluation Patient Details Name: Steve Brown MRN: [id number] DOB: 1940-04-25 Today's Date: 05/27/2020    History of Present Illness Pt is a 80 y.o. male admitted 05/25/20 after unwitnessed fall at home, c/o back and LLE pain, encephalopathy; of note, recently seen in ED 2 days prior with UTI. Imaging negative for acute injury. EEG 5/18 within normal limits. PMH includes CHF, CAD s/p PCI, CKD 3, DM, obesity, OSA on CPAP.   Clinical Impression   Pt admitted to the ED for concerns listed above. PTA pt reported that he was independent, living in an apartment alone, able to complete all ADL's and IADL's on his own, including medication management, cooking, and driving. At the time of the evaluation, pt is demonstrating some cognitive deficits, especially with initiation, problem solving, and safety awareness. Pt required verbal cues throughout the session to complete tasks and functional mobility safely. Pt requires mod A for all functional mobility and transfers,  As well as some level of assist for all ADL's. Pt will benefit from further rehab services in a SNF to improve strength, activity tolerance, and addressing cognitive deficits. Acute OT will follow to address all concerns listed below during hospital stay.     Follow Up Recommendations  SNF;Supervision/Assistance - 24 hour    Equipment Recommendations  3 in 1 bedside commode    Recommendations for Other Services       Precautions / Restrictions Precautions Precautions: None Restrictions Weight Bearing Restrictions: No      Mobility Bed Mobility Overal bed mobility: Needs Assistance Bed Mobility: Supine to Sit     Supine to sit: Mod assist;HOB elevated     General bed mobility comments: Pt needed assistance bringing his legs off the bed and elevating trunk to sitting.    Transfers Overall transfer level: Needs assistance Equipment used: Rolling walker (2 wheeled) Transfers: Sit to/from  [facility] Sit to Stand: Mod assist;From elevated surface Stand pivot transfers: Mod assist       General transfer comment: Pt reporting fear of falling intially, scooting forward on EOB instead of coming to stand. Required verbal cues to stop scooting forward due to pt being at the edge. With elevated surface and mod A pt was able to power up to standing. Pt required increased time and mod A to pivot for transfer to drop arm chair.    Balance Overall balance assessment: Needs assistance Sitting-balance support: Single extremity supported;Feet supported Sitting balance-Leahy Scale: Fair Sitting balance - Comments: Pt leaning on RUE when sitting, able to straighten posture with verbal cueing. Postural control: Right lateral lean Standing balance support: Bilateral upper extremity supported;During functional activity Standing balance-Leahy Scale: Poor Standing balance comment: Reliant on Bilateral UE on RW as well as external support                           ADL either performed or assessed with clinical judgement   ADL Overall ADL's : Needs assistance/impaired Eating/Feeding: Set up;Sitting Eating/Feeding Details (indicate cue type and reason): pt able to bring cup to mouth to drink Grooming: Wash/dry face;Set up;Sitting Grooming Details (indicate cue type and reason): Pt washed face with washcloth sitting in recliner Upper Body Bathing: Min guard;Sitting;Cueing for safety Upper Body Bathing Details (indicate cue type and reason): Pt needs cueing to iniate and assist with problem solving to safely complete UB bathing Lower Body Bathing: Minimal assistance;Cueing for sequencing;Sitting/lateral leans;Sit to/from stand Lower Body Bathing Details (indicate cue type and  reason): Min assist for balance in standing for pericare, verbal cues for safety and sequencing Upper Body Dressing : Set up;Sitting Upper Body Dressing Details (indicate cue type and reason):  Able to don hospital gown Lower Body Dressing: Maximal assistance;Sitting/lateral leans;Sit to/from stand Lower Body Dressing Details (indicate cue type and reason): Pt having difficulties at this time reaching his feet to don/doff socks and underwear due to pain in abdomen and low back. Toilet Transfer: Moderate assistance;Stand-pivot;BSC;RW Toilet Transfer Details (indicate cue type and reason): Pt needs mod A for standing and pivotting for safety and help with powering up. Intially pt reported fear of falling when attempting to stand. Toileting- Clothing Manipulation and Hygiene: Moderate assistance;Sitting/lateral lean;Sit to/from stand Toileting - Clothing Manipulation Details (indicate cue type and reason): Pt needing mod assist for standing with pericare due to balance concerns Tub/ Shower Transfer: Moderate assistance;Cueing for sequencing;Stand-pivot Tub/Shower Transfer Details (indicate cue type and reason): Pt needs mod assist to power up to standing and for ssafety and assisting with pivoting to sit on chair Functional mobility during ADLs: Moderate assistance;Rolling walker General ADL Comments: Pt requiring verbal cues for safety and sequencing, he continued scooting forward on the EOB, unaware that he was at the edge. Requiring mod assist for all standing and transferring due to safety, weakness, and fear of falling. Most ADL tasks, pt requires increased time to problem solve and verbal cueing for intiation and sequencing.     Vision Baseline Vision/History: Wears glasses Wears Glasses: Reading only Patient Visual Report: No change from baseline Vision Assessment?: No apparent visual deficits     Perception Perception Perception Tested?: No   Praxis Praxis Praxis tested?: Not tested    Pertinent Vitals/Pain Pain Assessment: Faces Faces Pain Scale: Hurts even more Pain Location: Low Back Pain Descriptors / Indicators: Aching;Discomfort;Grimacing Pain Intervention(s):  Limited activity within patient's tolerance;Monitored during session;Repositioned;Patient requesting pain meds-RN notified     Hand Dominance Right   Extremity/Trunk Assessment Upper Extremity Assessment Upper Extremity Assessment: Generalized weakness (3+/5 BUE shoulder flexion, 4/5 BUE elbow flexion and grip)   Lower Extremity Assessment Lower Extremity Assessment: Defer to PT evaluation   Cervical / Trunk Assessment Cervical / Trunk Assessment: Normal   Communication Communication Communication: No difficulties   Cognition Arousal/Alertness: Awake/alert Behavior During Therapy: WFL for tasks assessed/performed Overall Cognitive Status: No family/caregiver present to determine baseline cognitive functioning Area of Impairment: Attention;Memory;Safety/judgement;Awareness;Problem solving                   Current Attention Level: Selective Memory: Decreased short-term memory   Safety/Judgement: Decreased awareness of safety;Decreased awareness of deficits Awareness: Emergent Problem Solving: Slow processing;Decreased initiation;Requires verbal cues     General Comments  VSS on RA. Supine in bed HR 65 and O2 92%, sitting EOB and standing HR 76 and O2 100%    Exercises     Shoulder Instructions      Home Living Family/patient expects to be discharged to:: Private residence Living Arrangements: Alone Available Help at Discharge: Family;Available PRN/intermittently Type of Home: Apartment Home Access: Stairs to enter Entrance Stairs-Number of Steps: 16 stairs Entrance Stairs-Rails: Left;Right Home Layout: One level     Bathroom Shower/Tub: Teacher, early years/pre: Standard Bathroom Accessibility: Yes How Accessible: Accessible via walker Home Equipment: [place] - 2 wheels;Cane - single point;Shower seat          Prior Functioning/Environment Level of Independence: Independent with assistive device(s)  OT Problem List:  Decreased strength;Decreased activity tolerance;Impaired balance (sitting and/or standing);Decreased coordination;Decreased cognition;Decreased safety awareness;Decreased knowledge of use of DME or AE;Pain      OT Treatment/Interventions: Self-care/ADL training;Therapeutic exercise;Energy conservation;DME and/or AE instruction;Therapeutic activities;Cognitive remediation/compensation;Patient/family education;Balance training    OT Goals(Current goals can be found in the care plan section) Acute Rehab OT Goals Patient Stated Goal: To go home OT Goal Formulation: With patient Time For Goal Achievement: 06/10/20 Potential to Achieve Goals: Good ADL Goals Pt Will Perform Grooming: with modified independence;standing Pt Will Transfer to Toilet: with supervision;stand pivot transfer Pt Will Perform Toileting - Clothing Manipulation and hygiene: with supervision;sitting/lateral leans;sit to/from stand Additional ADL Goal #1: Pt will tolerate standing at sink to complete 3 grooming activities Additional ADL Goal #2: Pt will verbalize 3 fall prevention techniques.  OT Frequency: Min 2X/week   Barriers to D/C:            Co-evaluation              AM-PAC OT "6 Clicks" Daily Activity     Outcome Measure Help from another person eating meals?: A Little Help from another person taking care of personal grooming?: A Little Help from another person toileting, which includes using toliet, bedpan, or urinal?: A Lot Help from another person bathing (including washing, rinsing, drying)?: A Lot Help from another person to put on and taking off regular upper body clothing?: A Little Help from another person to put on and taking off regular lower body clothing?: A Lot 6 Click Score: 15   End of Session Equipment Utilized During Treatment: Gait belt;Rolling walker Nurse Communication: Mobility status;Patient requests pain meds  Activity Tolerance: Patient tolerated treatment well;Patient limited  by pain Patient left: in chair;with call bell/phone within reach;with chair alarm set;with nursing/sitter in room  OT Visit Diagnosis: Unsteadiness on feet (R26.81);Muscle weakness (generalized) (M62.81);History of falling (Z91.81)                Time: 3220-2542 OT Time Calculation (min): 34 min Charges:  OT General Charges $OT Visit: 1 Visit OT Evaluation $OT Eval Moderate Complexity: 1 Mod OT Treatments $Self Care/Home Management : 8-22 mins  Caddie Randle H., OTR/L Acute Rehabilitation  Bannie Lobban Elane Shaymus Eveleth 05/27/2020, 10:19 AM

## 2020-05-28 ENCOUNTER — Other Ambulatory Visit: Payer: Self-pay | Admitting: Interventional Cardiology

## 2020-05-28 ENCOUNTER — Encounter (HOSPITAL_COMMUNITY): Payer: Medicare Other

## 2020-05-28 ENCOUNTER — Inpatient Hospital Stay (HOSPITAL_COMMUNITY): Payer: Medicare Other

## 2020-05-28 DIAGNOSIS — G8929 Other chronic pain: Secondary | ICD-10-CM | POA: Diagnosis not present

## 2020-05-28 DIAGNOSIS — M5442 Lumbago with sciatica, left side: Secondary | ICD-10-CM

## 2020-05-28 DIAGNOSIS — M7989 Other specified soft tissue disorders: Secondary | ICD-10-CM | POA: Diagnosis not present

## 2020-05-28 LAB — CBC
HCT: 35.3 % — ABNORMAL LOW (ref 39.0–52.0)
Hemoglobin: 12 g/dL — ABNORMAL LOW (ref 13.0–17.0)
MCH: 31.3 pg (ref 26.0–34.0)
MCHC: 34 g/dL (ref 30.0–36.0)
MCV: 91.9 fL (ref 80.0–100.0)
Platelets: 169 10*3/uL (ref 150–400)
RBC: 3.84 MIL/uL — ABNORMAL LOW (ref 4.22–5.81)
RDW: 12.7 % (ref 11.5–15.5)
WBC: 5.9 10*3/uL (ref 4.0–10.5)
nRBC: 0 % (ref 0.0–0.2)

## 2020-05-28 LAB — GLUCOSE, CAPILLARY
Glucose-Capillary: 135 mg/dL — ABNORMAL HIGH (ref 70–99)
Glucose-Capillary: 191 mg/dL — ABNORMAL HIGH (ref 70–99)
Glucose-Capillary: 243 mg/dL — ABNORMAL HIGH (ref 70–99)
Glucose-Capillary: 246 mg/dL — ABNORMAL HIGH (ref 70–99)

## 2020-05-28 NOTE — Plan of Care (Signed)

## 2020-05-28 NOTE — Care Management (Addendum)
Messaged MD about patient declining of SNF, to order facve to face for home health. He will not have 24 hour supervision. He will have transportation home. Patient already has DME at home

## 2020-05-28 NOTE — Progress Notes (Signed)
RT placed patient on CPAP HS. 2L O2 bleed in needed. Patient tolerating well.  ?

## 2020-05-28 NOTE — Progress Notes (Signed)
Physical Therapy Treatment Patient Details Name: Steve Brown MRN: [id number] DOB: [birth year] Today's Date: 05/28/2020    History of Present Illness Pt is a 80 y.o. male admitted 05/25/20 after unwitnessed fall at home, c/o back and LLE pain, encephalopathy; of note, recently seen in ED 2 days prior with UTI. Imaging negative for acute injury. EEG 5/18 within normal limits. Abdominal CT negative for bowel obstruction; small hiatal hernia, concern for ascending UTI. PMH includes CHF, CAD s/p PCI, CKD 3, DM, obesity, OSA on CPAP.    PT Comments    Pt sitting EOB upon PT arrival to room, agreeable to mobility stating "I can walk all day". Pt tolerated short hallway distance x2 with use of RW, overall requiring min assist for steadying, navigating hallway/RW, and tactile facilitation of upright posture. Pt complaining of chronic back pain during mobility, but overall tolerates well. PT continuing to recommend SNF level of care post-acutely.    Follow Up Recommendations  SNF;Supervision/Assistance - 24 hour     Equipment Recommendations   (TBD)    Recommendations for Other Services       Precautions / Restrictions Precautions Precautions: Fall Restrictions Weight Bearing Restrictions: No    Mobility  Bed Mobility Overal bed mobility: Needs Assistance             General bed mobility comments: sitting EOB with NT assist upon PT arrival to room    Transfers Overall transfer level: Needs assistance Equipment used: Rolling walker (2 wheeled) Transfers: Sit to/from Stand Sit to Stand: Min assist         General transfer comment: Min assist for power up and steadying upon standing. VC for hand placement when rising/sitting, poor judge of spatial relation to chair when moving stand>sit.  Ambulation/Gait Ambulation/Gait assistance: Min assist Gait Distance (Feet): 50 Feet (x2 - standing rest break at midpoiint) Assistive device: Rolling walker (2 wheeled) Gait  Pattern/deviations: Step-through pattern;Decreased stride length;Trunk flexed Gait velocity: decr   General Gait Details: min assist to steady, guide RW and pt around obstacles, tactile facilitation of upright posture via sternum and pelvis. Verbal cuing for upright posture, placement in RW.   Stairs             Wheelchair Mobility    Modified Rankin (Stroke Patients Only)       Balance Overall balance assessment: Needs assistance Sitting-balance support: Feet supported;No upper extremity supported Sitting balance-Leahy Scale: Fair     Standing balance support: Bilateral upper extremity supported;During functional activity Standing balance-Leahy Scale: Poor Standing balance comment: reliant on external assist                            Cognition Arousal/Alertness: Awake/alert Behavior During Therapy: WFL for tasks assessed/performed Overall Cognitive Status: No family/caregiver present to determine baseline cognitive functioning Area of Impairment: Attention;Following commands                   Current Attention Level: Selective   Following Commands: Follows multi-step commands with increased time Safety/Judgement: Decreased awareness of deficits;Decreased awareness of safety Awareness: Emergent Problem Solving: Slow processing;Decreased initiation;Requires verbal cues;Difficulty sequencing General Comments: Pt tells several jokes throughout session, pleasant and eager to mobilize. Pt with poor insight into deficits, states he can "walk all day" but is very weak      Exercises General Exercises - Lower Extremity Long Arc Quad: AROM;Both;15 reps;Seated Hip Flexion/Marching: AROM;Both;10 reps;Seated    General Comments  Pertinent Vitals/Pain Pain Assessment: Faces Faces Pain Scale: Hurts even more Pain Location: low back, referred down LEs Pain Descriptors / Indicators: Discomfort;Grimacing;Guarding Pain Intervention(s): Limited  activity within patient's tolerance;Monitored during session;Repositioned    Home Living                      Prior Function            PT Goals (current goals can now be found in the care plan section) Acute Rehab PT Goals Patient Stated Goal: agreeable to post-acute rehab at SNF PT Goal Formulation: With patient Time For Goal Achievement: 06/10/20 Potential to Achieve Goals: Good Progress towards PT goals: Progressing toward goals    Frequency    Min 2X/week      PT Plan Current plan remains appropriate    Co-evaluation              AM-PAC PT "6 Clicks" Mobility   Outcome Measure  Help needed turning from your back to your side while in a flat bed without using bedrails?: A Lot Help needed moving from lying on your back to sitting on the side of a flat bed without using bedrails?: A Lot Help needed moving to and from a bed to a chair (including a wheelchair)?: Total Help needed standing up from a chair using your arms (e.g., wheelchair or bedside chair)?: Total Help needed to walk in hospital room?: Total Help needed climbing 3-5 steps with a railing? : Total 6 Click Score: 8    End of Session   Activity Tolerance: Patient limited by fatigue;Patient limited by pain Patient left: in chair;with call bell/phone within reach;with chair alarm set;with family/visitor present Nurse Communication: Mobility status PT Visit Diagnosis: Other abnormalities of gait and mobility (R26.89);Muscle weakness (generalized) (M62.81);Pain     Time: 1540-0867 PT Time Calculation (min) (ACUTE ONLY): 16 min  Charges:  $Gait Training: 8-22 mins                     Stacie Glaze, PT DPT Acute Rehabilitation Services Pager [phone]  Office [phone]  [place] 05/28/2020, 11:42 AM

## 2020-05-28 NOTE — Progress Notes (Signed)
PROGRESS NOTE    Steve Brown  [id number] DOB: 1940/12/17 DOA: 05/25/2020 PCP: Lawerance Cruel, MD   Chief Complaint  Patient presents with  . Fall  Brief Narrative: 80 year old male with multiple complex comorbidities with CVA, insulin-dependent diabetes, CAD status post PCI, chronic diastolic CHF, OSA on CPAP, HTN, HLD, PE history on Xarelto, GERD, depression, CKD stage IIIa, BPH presented after a fall unwitnessed at home.  He was complaining of back pain left thigh/hip pain and brought to the ED after unwitnessed fall, patient unable to recollect.  Of note he was seen in the ED 2 days prior to admission diagnosed with UTI sent home on Keflex. Underwent extensive work-up in the ED-with abnormal UA WBC more than 50, urine culture sent, CK 46, slightly + trops 40> 52, but without chest pain.  Chest x-ray normal, x-ray of the pelvis left hip lucency within the pubic symphysis on the left suspicious for nondisplaced fracture, subsequently underwent CT pelvis negative for fracture also had CT lumbar spine, CT cervical thoracic and lumbar spine,CT head-no acute fracture or subluxation, postop changes  In cervical areachronic height loss at L4, mild spinal canal stenosis at L4-5.  He has managed with pain control seen by neurosurgery no acute intervention advised and advised outpatient follow-up for possible injection pain control. Patient abdominal distention but asymptomatic having bowel movement.  Worked with PT OT and has advised skilled nursing facility.  At this time he is overall improved.  Subjective: Seen and examined this morning.  Is resting comfortably has no new complaints. Afebrile overnight Blood pressure stable Had a bowel movement yesterday He is agreeable for skilled nursing facility.  Assessment & Plan:  Unwitnessed fall/concern for syncope??:  Likely  fall rather than syncope.  Per daughter he fell Wednesday 3 am-patient had told her that he was trying to get up to go  the bathroom and moved while moving from edge of bed he fell around 3 am and she received call around 3.25. He had crawled to the bathroom and was on the floor when daughter arrived- EMS was called and brought back to ED, he had visited on Tuesday and snt home on antibiotics. Suspect fall in the setting of UTI and dehydration and back pain,nonfocal on exam.  No chest pain had mildly positive troponin D-dimer 0.6 at this time no other evidence of PE 2D echocardiogram was done normal EF no other acute finding.EEG shows normal no epileptiform discharges.  Carotid duplex no significant stenosis.continue PT OT a skilled nursing visit has been advised.    Chronic back pain/left leg pain:Previous MRI in September 2021 with moderately spinal stenosis L3-L4, L4-L5 and severe subarticular stenosis on the left due to facet hypertrophy and left paracentral disc protrusion.  CT CS, thoracic and lumbar spine no acute finding.  Seen by neurosurgery at this time no need for acute intervention and will need outpatient follow-up-possible injection.  Continue lidocaine patch, muscle relaxant PT OT.  UTI POA recently  on Keflex.urine cultures negative likely from being on antibiotics.  CT abdomen showed increasing bilateral perinephric stranding as well as some hazy stranding adjacent previously identified bladder diverticula concerning for possible ascending UTI. Continue on ceftriaxone.  He is afebrile no leukocytosis. Recent Labs  Lab 05/25/20 2032 05/27/20 0143 05/28/20 0325  WBC 8.8 8.3 5.9   Acute metabolic encephalopathy, poor historian but mental status appears to be stable at baseline. .nonfocal on exam CT head no acute finding.   TSH B12, ammonia level normal.  Continue  PT OT  Mildly borderline positive troponin,unclear etiology no chest pain no ischemic change on EKG D dimer 0.6-patient without chest pain shortness or tachycardia or any other signs of PE.  Corrected for his age D-dimer probably normal.  I  discussed with patient extensively about this finding, patient feels comfortable and after discussion does not want to proceed with CTA at this time, is already anticoagulated on Xarelto.Very low suspicion for PE.  Checking duplex of the leg for completeness.  Abdomen distension/versus obesity: Had a bowel movement, continue MiraLAX as needed.CT abdomen on repeat shows no evidence of bowel obstruction.  Tolerating diet.  Insulin-dependent diabetes poorly controlled W/ hyperglycemia.  Hemoglobin A1c 7.5.  Blood sugars currently fairly stable on SSI/home Lantus, and monitor blood sugar. Recent Labs  Lab 05/27/20 0739 05/27/20 1132 05/27/20 1611 05/27/20 2111 05/28/20 0746  GLUCAP 127* 238* 276* 262* 135*   OSA on CPAP continue nightly  Chronic diastolic CHF: Currently euvolemic.  No shortness of breath.  Monitor  CAD history of PCI: He is on Xarelto and Crestor.  AKI on CKD stage IIIa, baseline creatinine 1.2.  Creatinine has improved to 1.1.  Monitor Recent Labs  Lab 05/25/20 2032 05/26/20 0728 05/27/20 0143  BUN 20 17 20   CREATININE 1.45* 1.17 1.18   Hyperbilirubinemia stable  GERD on PPI  Hyperlipidemia-on crestor  Morbid obesity w/ BMI 34: He will benefit with weight loss.  Diet Order            Diet heart healthy/carb modified Room service appropriate? Yes with Assist; Fluid consistency: Thin  Diet effective now               Patient's Body mass index is 34.38 kg/m.  DVT prophylaxis: XARELTO Code Status:   Code Status: Full Code  Family Communication:Plan of care discussed with patient at bedside. I called and updated his daughter and all the questions were answered.     Status is: Inpatient Remains hospitalized for ongoing management of his back pain, PTOT eval. Dispo: The patient is from: Home lives alone              Anticipated d/c is to:SNF likely over the weekend if bed available.[place] working.              Patient currently is not medically stable to  d/c.   Difficult to place patient No   Unresulted Labs (From admission, onward)          Start     Ordered   05/27/20 0500  CBC  Daily,   R      05/26/20 1048        Medications reviewed:  Scheduled Meds: . insulin aspart  0-15 Units Subcutaneous TID WC  . insulin aspart  0-5 Units Subcutaneous QHS  . insulin glargine  28 Units Subcutaneous Daily  . lidocaine  1 patch Transdermal Q24H  . pantoprazole  40 mg Oral q morning  . polyethylene glycol  17 g Oral Daily  . pregabalin  300 mg Oral BID  . rivaroxaban  20 mg Oral Daily  . rosuvastatin  10 mg Oral Daily   Continuous Infusions: . cefTRIAXone (ROCEPHIN)  IV 1 g (05/27/20 2130)    Consultants:see note  Procedures:  TTE 5/18 Left ventricular ejection fraction, by estimation, is 60 to 65%. The  left ventricle has normal function. The left ventricle has no regional  wall motion abnormalities. Left ventricular diastolic parameters are  consistent with Grade I diastolic  dysfunction (  impaired relaxation).  2. Right ventricular systolic function is normal. The right ventricular  size is normal. There is mildly elevated pulmonary artery systolic  pressure.  3. Left atrial size was mildly dilated.  4. Right atrial size was mildly dilated.  5. The mitral valve is normal in structure. No evidence of mitral valve  regurgitation. No evidence of mitral stenosis.  6. The aortic valve is normal in structure. There is mild calcification  of the aortic valve. Aortic valve regurgitation is trivial. No aortic  stenosis is present.  7. The inferior vena cava is normal in size with greater than 50%  respiratory variability, suggesting right atrial pressure of 3 mmHg   Antimicrobials: Anti-infectives (From admission, onward)   Start     Dose/Rate Route Frequency Ordered Stop   05/26/20 2200  cefTRIAXone (ROCEPHIN) 1 g in sodium chloride 0.9 % 100 mL IVPB        1 g 200 mL/hr over 30 Minutes Intravenous Every 24 hours  05/26/20 0625     05/25/20 2300  cefTRIAXone (ROCEPHIN) 1 g in sodium chloride 0.9 % 100 mL IVPB        1 g 200 mL/hr over 30 Minutes Intravenous  Once 05/25/20 2246 05/25/20 2333     Culture/Microbiology    Component Value Date/Time   SDES URINE, RANDOM 05/25/2020 2156   [place] 2350 05/25/2020 2156   CULT  05/25/2020 2156    NO GROWTH Performed at [facility] [address], [place], [place] [address]    REPTSTATUS 05/27/2020 FINAL 05/25/2020 2156    Other culture-see note  Objective: Vitals: Today's Vitals   05/27/20 2320 05/28/20 0018 05/28/20 0432 05/28/20 0621  BP:  118/67 118/72   Pulse: 70 78 72   Resp:  18 18   Temp:   98.6 F (37 C) 100 F (37.8 C)  TempSrc:   Oral Oral  SpO2: 98% 94% 97%   Weight:      Height:      PainSc:        Intake/Output Summary (Last 24 hours) at 05/28/2020 0810 Last data filed at 05/27/2020 2000 Gross per 24 hour  Intake 680 ml  Output 700 ml  Net -20 ml   Filed Weights   05/25/20 2035 05/26/20 1541  Weight: 97.5 kg 93.7 kg   Weight change:   Intake/Output from previous day: 05/19 0701 - 05/20 0700 In: 920 [P.O.:820; IV Piggyback:100] Out: 700 [Urine:700] Intake/Output this shift: No intake/output data recorded. Filed Weights   05/25/20 2035 05/26/20 1541  Weight: 97.5 kg 93.7 kg    Examination: General exam: AAOx3, elderly HEENT:Oral mucosa moist, Ear/Nose WNL grossly, dentition normal. Respiratory system: bilaterally diminished, no crackles, no use of accessory muscle Cardiovascular system: S1 & S2 +, No JVD,. Gastrointestinal system: Abdomen soft, obese abdomen nontender bowel sounds present  Nervous System:Alert, awake, moving extremities and grossly nonfocal Extremities: no edema, distal peripheral pulses palpable.  Skin: No rashes,no icterus. MSK: Normal muscle bulk,tone, power  Data Reviewed: I have personally reviewed following labs and imaging studies CBC: Recent Labs  Lab  05/25/20 2032 05/27/20 0143 05/28/20 0325  WBC 8.8 8.3 5.9  NEUTROABS 6.6  --   --   HGB 13.9 12.7* 12.0*  HCT 41.4 37.1* 35.3*  MCV 93.9 92.5 91.9  PLT 162 160 [id number]   Basic Metabolic Panel: Recent Labs  Lab 05/25/20 2032 05/26/20 0728 05/27/20 0143  NA 134* 136 133*  K 4.3 4.2 4.0  CL  100 104 101  CO2 25 22 24   GLUCOSE 358* 251* 168*  BUN 20 17 20   CREATININE 1.45* 1.17 1.18  CALCIUM 9.2 8.6* 8.5*   GFR: Estimated Creatinine Clearance: 54.3 mL/min (by C-G formula based on SCr of 1.18 mg/dL). Liver Function Tests: Recent Labs  Lab 05/25/20 2032 05/26/20 0728 05/27/20 0143  AST 25  --  23  22  ALT 25  --  19  19  ALKPHOS 65  --  48  47  BILITOT 1.7* 1.1 1.0  0.8  PROT 7.5  --  6.5  6.2*  ALBUMIN 3.7  --  2.9*  2.9*   Recent Labs  Lab 05/27/20 0143  LIPASE 37   Recent Labs  Lab 05/26/20 0728  AMMONIA 29   Coagulation Profile: No results for input(s): INR, PROTIME in the last 168 hours. Cardiac Enzymes: Recent Labs  Lab 05/25/20 2230  CKTOTAL 406*   BNP (last 3 results) No results for input(s): PROBNP in the last 8760 hours. HbA1C: Recent Labs    05/26/20 0914  HGBA1C 7.5*   CBG: Recent Labs  Lab 05/27/20 0739 05/27/20 1132 05/27/20 1611 05/27/20 2111 05/28/20 0746  GLUCAP 127* 238* 276* 262* 135*   Lipid Profile: No results for input(s): CHOL, HDL, LDLCALC, TRIG, CHOLHDL, LDLDIRECT in the last 72 hours. Thyroid Function Tests: Recent Labs    05/26/20 0728  TSH 1.731   Anemia Panel: Recent Labs    05/26/20 0728  VITAMINB12 480   Sepsis Labs: No results for input(s): PROCALCITON, LATICACIDVEN in the last 168 hours.  Recent Results (from the past 240 hour(s))  Resp Panel by RT-PCR (Flu A&B, Covid) Nasopharyngeal Swab     Status: None   Collection Time: 05/25/20  9:38 PM   Specimen: Nasopharyngeal Swab; Nasopharyngeal(NP) swabs in vial transport medium  Result Value Ref Range Status   SARS Coronavirus 2 by RT PCR  NEGATIVE NEGATIVE Final    Comment: (NOTE) SARS-CoV-2 target nucleic acids are NOT DETECTED.  The SARS-CoV-2 RNA is generally detectable in upper respiratory specimens during the acute phase of infection. The lowest concentration of SARS-CoV-2 viral copies this assay can detect is 138 copies/mL. A negative result does not preclude SARS-Cov-2 infection and should not be used as the sole basis for treatment or other patient management decisions. A negative result may occur with  improper specimen collection/handling, submission of specimen other than nasopharyngeal swab, presence of viral mutation(s) within the areas targeted by this assay, and inadequate number of viral copies(<138 copies/mL). A negative result must be combined with clinical observations, patient history, and epidemiological information. The expected result is Negative.  Fact Sheet for Patients:  EntrepreneurPulse.com.au  Fact Sheet for Healthcare Providers:  IncredibleEmployment.be  This test is no t yet approved or cleared by the [place] FDA and  has been authorized for detection and/or diagnosis of SARS-CoV-2 by FDA under an Emergency Use Authorization (EUA). This EUA will remain  in effect (meaning this test can be used) for the duration of the COVID-19 declaration under Section 564(b)(1) of the Act, 21 U.S.C.section 360bbb-3(b)(1), unless the authorization is terminated  or revoked sooner.       Influenza A by PCR NEGATIVE NEGATIVE Final   Influenza B by PCR NEGATIVE NEGATIVE Final    Comment: (NOTE) The Xpert Xpress SARS-CoV-2/FLU/RSV plus assay is intended as an aid in the diagnosis of influenza from Nasopharyngeal swab specimens and should not be used as a sole basis for treatment. Nasal washings and  aspirates are unacceptable for Xpert Xpress SARS-CoV-2/FLU/RSV testing.  Fact Sheet for Patients: EntrepreneurPulse.com.au  Fact Sheet for  Healthcare Providers: IncredibleEmployment.be  This test is not yet approved or cleared by the [place] FDA and has been authorized for detection and/or diagnosis of SARS-CoV-2 by FDA under an Emergency Use Authorization (EUA). This EUA will remain in effect (meaning this test can be used) for the duration of the COVID-19 declaration under Section 564(b)(1) of the Act, 21 U.S.C. section 360bbb-3(b)(1), unless the authorization is terminated or revoked.  Performed at [facility], [place] [address], [place], [place] [address]   Urine culture     Status: None   Collection Time: 05/25/20  9:56 PM   Specimen: Urine, Random  Result Value Ref Range Status   Specimen Description URINE, RANDOM  Final   Special Requests ADDED 2350  Final   Culture   Final    NO GROWTH Performed at [facility], [place] [address] [place], [place] [address]    Report Status 05/27/2020 FINAL  Final     Radiology Studies: CT ABDOMEN PELVIS WO CONTRAST  Result Date: 05/26/2020 CLINICAL DATA:  Abdominal distension EXAM: CT ABDOMEN AND PELVIS WITHOUT CONTRAST TECHNIQUE: Multidetector CT imaging of the abdomen and pelvis was performed following the standard protocol without IV contrast. COMPARISON:  CT renal colic 86/57/8469 FINDINGS: Lower chest: Mild cardiomegaly. Three-vessel coronary artery atherosclerosis. Distal thoracic aortic atherosclerosis. Small hiatal hernia and hyperdense contrast media present within the distal thoracic esophagus. Bibasilar atelectasis with additional bandlike areas of scarring or subsegmental atelectasis. No visible effusion. Hepatobiliary: No visible focal liver lesion within limitations of an unenhanced CT. Normal hepatic attenuation. Smooth surface contour. Prior cholecystectomy. No visible calcified gallstones or significant biliary ductal dilatation. Pancreas: Partial fatty replacement of the pancreas. No pancreatic ductal dilatation or surrounding  inflammatory changes. Spleen: Normal in size. No concerning splenic lesions. Adrenals/Urinary Tract: Normal adrenal glands. Bilateral renal cortical atrophy. Increased bilateral perinephric stranding when compared to recent imaging performed 05/24/2020. Few fluid attenuation cysts are again noted. No concerning visible or contour deforming renal lesions. No urolithiasis or hydronephrosis. Left posterolateral bladder diverticulum is noted, some hazy stranding is noted adjacent this diverticulum, not as clearly evident on comparison. The indentation of bladder base by an enlarged prostate. Stomach/Bowel: Small hiatal hernia and high attenuation contrast media retained within the distal thoracic esophagus. Contrast media has traversed to the level of the transverse colon. No evidence of bowel obstruction. No conspicuous large or small thickening or dilatation. Contrast media opacifies a normal caliber noninflamed appendix in the right lower quadrant as well. Vascular/Lymphatic: Atherosclerotic calcifications within the abdominal aorta and branch vessels. No aneurysm or ectasia. No enlarged abdominopelvic lymph nodes. Reproductive: The prostate and seminal vesicles are unremarkable. Multiple calcified phleboliths in the pelvis and along the spermatic cords. Other: No abdominopelvic free air or fluid no bowel containing hernia. Abdominal wall laxity is similar to prior. Musculoskeletal: Multilevel degenerative changes are present in the imaged portions of the spine. Additional degenerative changes in both hips and pelvis. Partial fusion across the SI joints. No concerning lytic or blastic lesions or other acute osseous abnormalities. IMPRESSION: 1. No evidence of high-grade bowel obstruction or other acute bowel abnormality. Abdominal wall laxity is not significantly changed from comparison. The 2. Increasing bilateral perinephric stranding as well as some hazy stranding adjacent a previously identified bladder  diverticula. Findings are concerning for possible ascending urinary tract infection. Background bilateral renal atrophy/scarring. 3. Indentation of the  bladder base by an enlarged prostate could reflect some chronic outlet obstruction correlate with clinical symptoms. 4. Small hiatal hernia with high attenuation contrast media retained or refluxed into the distal thoracic esophagus, correlate for clinical symptoms. 5. Borderline cardiomegaly.  Coronary artery atherosclerosis. 6.  Aortic Atherosclerosis (ICD10-I70.0). 7. Mild body wall edema, correlate with fluid status. Electronically Signed   By: Lovena Le M.D.   On: 05/26/2020 21:59   EEG adult  Result Date: 05/26/2020 Lora Havens, MD     05/26/2020 11:47 AM Patient Name: Steve Brown MRN: [id number] Epilepsy Attending: Lora Havens Referring Physician/Provider: Dr Derrick Ravel Date: 05/26/2020 Duration: 25 mins Patient history: 80yo M with ams, syncope. EEG to evaluate for seizure. Level of alertness: Awake, asleep AEDs during EEG study: Pregabalin Technical aspects: This EEG study was done with scalp electrodes positioned according to the 10-20 International system of electrode placement. Electrical activity was acquired at a sampling rate of 500Hz  and reviewed with a high frequency filter of 70Hz  and a low frequency filter of 1Hz . EEG data were recorded continuously and digitally stored. Description: The posterior dominant rhythm consists of 8-9 Hz activity of moderate voltage (25-35 uV) seen predominantly in posterior head regions, symmetric and reactive to eye opening and eye closing. Sleep was characterized by vertex waves, sleep spindles (12 to 14 Hz), maximal frontocentral region. Hyperventilation and photic stimulation were not performed.   IMPRESSION: This study is within normal limits. No seizures or epileptiform discharges were seen throughout the recording. Lora Havens   ECHOCARDIOGRAM COMPLETE  Result Date:  05/26/2020    ECHOCARDIOGRAM REPORT   Patient Name:   Steve Brown Date of Exam: 05/26/2020 Medical Rec #:  [id number]        Height:       65.0 in Accession #:    [id number]       Weight:       215.0 lb Date of Birth:  09-Dec-1940         BSA:          2.040 m Patient Age:    54 years         BP:           151/80 mmHg Patient Gender: M                HR:           66 bpm. Exam Location:  Inpatient Procedure: 2D Echo, Cardiac Doppler and Color Doppler Indications:    Syncope  History:        Patient has no prior history of Echocardiogram examinations.                 CAD, Stroke and COPD, Signs/Symptoms:Syncope; Risk                 Factors:Diabetes, Hypertension, Dyslipidemia and Sleep Apnea.                 CKD, pul.embolus.  Sonographer:    Dustin Flock Referring Phys: [id number] [place]  1. Left ventricular ejection fraction, by estimation, is 60 to 65%. The left ventricle has normal function. The left ventricle has no regional wall motion abnormalities. Left ventricular diastolic parameters are consistent with Grade I diastolic dysfunction (impaired relaxation).  2. Right ventricular systolic function is normal. The right ventricular size is normal. There is mildly elevated pulmonary artery systolic pressure.  3. Left atrial size was mildly dilated.  4. Right atrial size was mildly  dilated.  5. The mitral valve is normal in structure. No evidence of mitral valve regurgitation. No evidence of mitral stenosis.  6. The aortic valve is normal in structure. There is mild calcification of the aortic valve. Aortic valve regurgitation is trivial. No aortic stenosis is present.  7. The inferior vena cava is normal in size with greater than 50% respiratory variability, suggesting right atrial pressure of 3 mmHg. FINDINGS  Left Ventricle: Left ventricular ejection fraction, by estimation, is 60 to 65%. The left ventricle has normal function. The left ventricle has no regional wall motion  abnormalities. The left ventricular internal cavity size was normal in size. There is  no left ventricular hypertrophy. Left ventricular diastolic parameters are consistent with Grade I diastolic dysfunction (impaired relaxation). Right Ventricle: The right ventricular size is normal. No increase in right ventricular wall thickness. Right ventricular systolic function is normal. There is mildly elevated pulmonary artery systolic pressure. The tricuspid regurgitant velocity is 2.93  m/s, and with an assumed right atrial pressure of 3 mmHg, the estimated right ventricular systolic pressure is 37.9 mmHg. Left Atrium: Left atrial size was mildly dilated. Right Atrium: Right atrial size was mildly dilated. Pericardium: There is no evidence of pericardial effusion. Mitral Valve: The mitral valve is normal in structure. No evidence of mitral valve regurgitation. No evidence of mitral valve stenosis. Tricuspid Valve: The tricuspid valve is normal in structure. Tricuspid valve regurgitation is trivial. No evidence of tricuspid stenosis. Aortic Valve: The aortic valve is normal in structure. There is mild calcification of the aortic valve. Aortic valve regurgitation is trivial. No aortic stenosis is present. Pulmonic Valve: The pulmonic valve was normal in structure. Pulmonic valve regurgitation is trivial. No evidence of pulmonic stenosis. Aorta: The aortic root is normal in size and structure. Venous: The inferior vena cava is normal in size with greater than 50% respiratory variability, suggesting right atrial pressure of 3 mmHg. IAS/Shunts: No atrial level shunt detected by color flow Doppler.  LEFT VENTRICLE PLAX 2D LVIDd:         4.10 cm  Diastology LVIDs:         2.80 cm  LV e' medial:    4.57 cm/s LV PW:         1.50 cm  LV E/e' medial:  11.2 LV IVS:        1.60 cm  LV e' lateral:   4.24 cm/s LVOT diam:     2.20 cm  LV E/e' lateral: 12.0 LV SV:         62 LV SV Index:   31 LVOT Area:     3.80 cm  RIGHT VENTRICLE RV  Basal diam:  3.20 cm RV S prime:     5.98 cm/s TAPSE (M-mode): 2.2 cm LEFT ATRIUM             Index       RIGHT ATRIUM           Index LA diam:        3.90 cm 1.91 cm/m  RA Area:     20.00 cm LA Vol (A2C):   59.7 ml 29.27 ml/m RA Volume:   53.80 ml  26.37 ml/m LA Vol (A4C):   30.9 ml 15.15 ml/m LA Biplane Vol: 47.5 ml 23.29 ml/m  AORTIC VALVE LVOT Vmax:   80.90 cm/s LVOT Vmean:  54.100 cm/s LVOT VTI:    0.164 m  AORTA Ao Root diam: 3.20 cm MITRAL VALVE  TRICUSPID VALVE MV Area (PHT): 3.12 cm    TR Peak grad:   34.3 mmHg MV Decel Time: 243 msec    TR Vmax:        293.00 cm/s MV E velocity: 51.00 cm/s MV A velocity: 75.40 cm/s  SHUNTS MV E/A ratio:  0.68        Systemic VTI:  0.16 m                            Systemic Diam: 2.20 cm Glori Bickers MD Electronically signed by Glori Bickers MD Signature Date/Time: 05/26/2020/2:40:51 PM    Final    VAS US CAROTID  Result Date: 05/26/2020 Carotid Arterial Duplex Study Patient Name:  Steve Brown  Date of Exam:   05/26/2020 Medical Rec #: [id number]         Accession #:    [id number] Date of Birth: [birth year]          Patient Gender: M Patient Age:   80Y Exam Location:  [facility] Procedure:      VAS US CAROTID Referring Phys: [id number] VASUNDHRA RATHORE --------------------------------------------------------------------------------  Indications:      Syncope. Risk Factors:     Hypertension, hyperlipidemia, Diabetes, coronary artery                   disease. Comparison Study: No prior studies. Performing Technologist: Darlin Coco RDMS,RVT  Examination Guidelines: A complete evaluation includes B-mode imaging, spectral Doppler, color Doppler, and power Doppler as needed of all accessible portions of each vessel. Bilateral testing is considered an integral part of a complete examination. Limited examinations for reoccurring indications may be performed as noted.  Right Carotid Findings:  +----------+--------+--------+--------+------------------+--------+           PSV cm/sEDV cm/sStenosisPlaque DescriptionComments +----------+--------+--------+--------+------------------+--------+ CCA Prox  74      12                                         +----------+--------+--------+--------+------------------+--------+ CCA Distal85      17              calcific                   +----------+--------+--------+--------+------------------+--------+ ICA Prox  90      18      1-39%   calcific                   +----------+--------+--------+--------+------------------+--------+ ICA Distal83      24                                         +----------+--------+--------+--------+------------------+--------+ ECA       98      13                                         +----------+--------+--------+--------+------------------+--------+ +----------+--------+-------+----------------+-------------------+           PSV cm/sEDV cmsDescribe        Arm Pressure (mmHG) +----------+--------+-------+----------------+-------------------+ [id number]            Multiphasic, WNL                    +----------+--------+-------+----------------+-------------------+ +---------+--------+--+--------+--+---------+  VertebralPSV cm/s71EDV cm/s16Antegrade +---------+--------+--+--------+--+---------+  Left Carotid Findings: +----------+--------+--------+--------+----------------------------+--------+           PSV cm/sEDV cm/sStenosisPlaque Description          Comments +----------+--------+--------+--------+----------------------------+--------+ CCA Prox  99      16                                                   +----------+--------+--------+--------+----------------------------+--------+ CCA Mid   153     22      <50%    hyperechoic and heterogenous         +----------+--------+--------+--------+----------------------------+--------+ CCA Distal80       15                                                   +----------+--------+--------+--------+----------------------------+--------+ ICA Prox  79      11      1-39%   calcific                             +----------+--------+--------+--------+----------------------------+--------+ ICA Distal93      27                                                   +----------+--------+--------+--------+----------------------------+--------+ ECA       68                                                           +----------+--------+--------+--------+----------------------------+--------+ +----------+--------+--------+----------------+-------------------+           PSV cm/sEDV cm/sDescribe        Arm Pressure (mmHG) +----------+--------+--------+----------------+-------------------+ [id number]             Multiphasic, WNL                    +----------+--------+--------+----------------+-------------------+ +---------+--------+--+--------+--+---------+ VertebralPSV cm/s45EDV cm/s23Antegrade +---------+--------+--+--------+--+---------+   Summary: Right Carotid: Velocities in the right ICA are consistent with a 1-39% stenosis. Left Carotid: Velocities in the left ICA are consistent with a 1-39% stenosis.               Non-hemodynamically significant plaque <50% noted in the CCA. Vertebrals:  Bilateral vertebral arteries demonstrate antegrade flow. Subclavians: Normal flow hemodynamics were seen in bilateral subclavian              arteries. *See table(s) above for measurements and observations.  Electronically signed by Antony Contras MD on 05/26/2020 at 4:29:08 PM.    Final      LOS: 1 day   Antonieta Pert, MD [facility]  05/28/2020, 8:10 AM

## 2020-05-28 NOTE — TOC Progression Note (Addendum)
Transition of Care [facility]) - Progression Note    Patient Details  Name: Nature Vogelsang MRN: [id number] Date of Birth: June 13, 1940  Transition of Care [facility]) CM/SW [place], [place] Phone Number: 05/28/2020, 1:07 PM  Clinical Narrative:     CSW provided patient with SNF bed offers. Patient has now decided that he wants to go home when medically ready. Patient reports he has a friend that can help provide some supervision at home. Patient reports that he will have transportation at dc. Patients daughter or friend will come pick him up when medically ready. CSW will continue to follow and assist with discharge planning needs.    Expected Discharge Plan: Skilled Nursing Facility Barriers to Discharge: No Barriers Identified  Expected Discharge Plan and Services Expected Discharge Plan: [place] In-house Referral: Clinical Social Work                                             Social Determinants of Health (SDOH) Interventions    Readmission Risk Interventions No flowsheet data found.

## 2020-05-28 NOTE — Progress Notes (Signed)
BLE venous duplex has been completed.  Results can be found under chart review under CV PROC. 05/28/2020 6:00 PM Anaisa Radi RVT, RDMS

## 2020-05-28 NOTE — TOC Progression Note (Signed)
Transition of Care [facility]) - Progression Note    Patient Details  Name: Steve Brown MRN: [id number] Date of Birth: Feb 05, 1940  Transition of Care [facility]) CM/SW [place], [place] Phone Number: 05/28/2020, 4:39 PM  Clinical Narrative:    Spoke to patient about rehab vs home. He stated his friend hasd spoken to him and felt that he could not go home at this time He has made the decision to persue SNF. Went on [employer]gov and read through the quality indicator of each SNF that he had been accepted to . [facility] chose [facility]. Becci Batty Coup informed of decision. The patient has been vaccinated for COVID with 2 regular shots and 2 boosters. .    Expected Discharge Plan: Skilled Nursing Facility Barriers to Discharge: No Barriers Identified  Expected Discharge Plan and Services Expected Discharge Plan: [place] In-house Referral: Clinical Social Work                                             Social Determinants of Health (SDOH) Interventions    Readmission Risk Interventions No flowsheet data found.

## 2020-05-28 NOTE — NC FL2 (Signed)
[place] LEVEL OF CARE SCREENING TOOL     IDENTIFICATION  Patient Name: Steve Brown Birthdate: 10-21-1940 Sex: male Admission Date (Current Location): 05/25/2020  [facility] and [place] Number:  Herbalist and Address:  [facility] [place] [facility]. [facility], [place] [address], [place], [place] [address]      Provider Number: [id number]  Attending Physician Name and Address:  Antonieta Pert, MD  Relative Name and Phone Number:  Jonelle Sidle [phone]    Current Level of Care: Hospital Recommended Level of Care: [place] Prior Approval Number:    Date Approved/Denied:   PASRR Number: [id number]  Discharge Plan: SNF    Current Diagnoses: Patient Active Problem List   Diagnosis Date Noted  . Syncope 05/26/2020  . Back pain 05/26/2020  . UTI (urinary tract infection) 05/26/2020  . Acute metabolic encephalopathy 26/71/2458  . Diabetes ([place]) 05/26/2020    Orientation RESPIRATION BLADDER Height & Weight     Self,Situation,Place  Normal Incontinent Weight: 206 lb 9.1 oz (93.7 kg) Height:  5\' 5"  (165.1 cm)  BEHAVIORAL SYMPTOMS/MOOD NEUROLOGICAL BOWEL NUTRITION STATUS      Continent Diet (See Discharge Summary)  AMBULATORY STATUS COMMUNICATION OF NEEDS Skin   Extensive Assist Verbally Normal                       Personal Care Assistance Level of Assistance  Bathing,Feeding,Dressing Bathing Assistance: Limited assistance Feeding assistance: Independent Dressing Assistance: Limited assistance     Functional Limitations Info  Sight,Hearing,Speech   Hearing Info: Adequate Speech Info: Adequate    SPECIAL CARE FACTORS FREQUENCY  PT (By licensed PT),OT (By licensed OT)     PT Frequency: 5x min weekly OT Frequency: 5x min weekly            Contractures Contractures Info: Not present    Additional Factors Info  Code Status,Allergies,Insulin Sliding Scale Code Status Info: FULL Allergies Info: Iohexol,Phenergan  (promethazine),Tramadol   Insulin Sliding Scale Info: insulin aspart (novoLOG) injection 0-15 Units 3 times daily with meals,insulin aspart (novoLOG) injection 0-5 Units daily at bedtime,insulin glargine (LANTUS) injection 28 Units daily       Current Medications (05/28/2020):  This is the current hospital active medication list Current Facility-Administered Medications  Medication Dose Route Frequency Provider Last Rate Last Admin  . acetaminophen (TYLENOL) tablet 650 mg  650 mg Oral Q6H PRN Shela Leff, MD       Or  . acetaminophen (TYLENOL) suppository 650 mg  650 mg Rectal Q6H PRN Shela Leff, MD      . bisacodyl (DULCOLAX) suppository 10 mg  10 mg Rectal Daily PRN Kc, Ramesh, MD      . cefTRIAXone (ROCEPHIN) 1 g in sodium chloride 0.9 % 100 mL IVPB  1 g Intravenous Q24H Shela Leff, MD 200 mL/hr at 05/27/20 2130 1 g at 05/27/20 2130  . insulin aspart (novoLOG) injection 0-15 Units  0-15 Units Subcutaneous TID WC Shela Leff, MD   2 Units at 05/28/20 0827  . insulin aspart (novoLOG) injection 0-5 Units  0-5 Units Subcutaneous QHS Shela Leff, MD   3 Units at 05/27/20 2129  . insulin glargine (LANTUS) injection 28 Units  28 Units Subcutaneous Daily Antonieta Pert, MD   28 Units at 05/28/20 0827  . lidocaine (LIDODERM) 5 % 1 patch  1 patch Transdermal Q24H Kc, Ramesh, MD   1 patch at 05/27/20 1248  . methocarbamol (ROBAXIN) tablet 500 mg  500 mg Oral Q8H PRN  Antonieta Pert, MD   500 mg at 05/27/20 1658  . oxyCODONE (Oxy IR/ROXICODONE) immediate release tablet 5 mg  5 mg Oral Q6H PRN Kc, Ramesh, MD   5 mg at 05/28/20 0830  . pantoprazole (PROTONIX) EC tablet 40 mg  40 mg Oral q morning Shela Leff, MD   40 mg at 05/28/20 2836  . polyethylene glycol (MIRALAX / GLYCOLAX) packet 17 g  17 g Oral Daily Kc, Ramesh, MD   17 g at 05/28/20 0828  . pregabalin (LYRICA) capsule 300 mg  300 mg Oral BID Shela Leff, MD   300 mg at 05/28/20 0827  . rivaroxaban  (XARELTO) tablet 20 mg  20 mg Oral Daily Shela Leff, MD   20 mg at 05/28/20 6294  . rosuvastatin (CRESTOR) tablet 10 mg  10 mg Oral Daily Shela Leff, MD   10 mg at 05/28/20 7654     Discharge Medications: Please see discharge summary for a list of discharge medications.  Relevant Imaging Results:  Relevant Lab Results:   Additional Information [phone]  Trula Ore, LCSWA

## 2020-05-29 DIAGNOSIS — G8929 Other chronic pain: Secondary | ICD-10-CM | POA: Diagnosis not present

## 2020-05-29 DIAGNOSIS — M5442 Lumbago with sciatica, left side: Secondary | ICD-10-CM | POA: Diagnosis not present

## 2020-05-29 LAB — CBC
HCT: 35.5 % — ABNORMAL LOW (ref 39.0–52.0)
Hemoglobin: 12 g/dL — ABNORMAL LOW (ref 13.0–17.0)
MCH: 31 pg (ref 26.0–34.0)
MCHC: 33.8 g/dL (ref 30.0–36.0)
MCV: 91.7 fL (ref 80.0–100.0)
Platelets: 168 10*3/uL (ref 150–400)
RBC: 3.87 MIL/uL — ABNORMAL LOW (ref 4.22–5.81)
RDW: 12.7 % (ref 11.5–15.5)
WBC: 5.8 10*3/uL (ref 4.0–10.5)
nRBC: 0 % (ref 0.0–0.2)

## 2020-05-29 LAB — GLUCOSE, CAPILLARY
Glucose-Capillary: 243 mg/dL — ABNORMAL HIGH (ref 70–99)
Glucose-Capillary: 237 mg/dL — ABNORMAL HIGH (ref 70–99)
Glucose-Capillary: 244 mg/dL — ABNORMAL HIGH (ref 70–99)
Glucose-Capillary: 207 mg/dL — ABNORMAL HIGH (ref 70–99)

## 2020-05-29 NOTE — Progress Notes (Signed)
PROGRESS NOTE    Steve Brown  INO:[id number] DOB: May 08, 1940 DOA: 05/25/2020 PCP: Lawerance Cruel, MD   Chief Complaint  Patient presents with  . Fall  Brief Narrative: 80 year old male with multiple complex comorbidities with CVA, insulin-dependent diabetes, CAD status post PCI, chronic diastolic CHF, OSA on CPAP, HTN, HLD, PE history on Xarelto, GERD, depression, CKD stage IIIa, BPH presented after a fall unwitnessed at home.  He was complaining of back pain left thigh/hip pain and brought to the ED after unwitnessed fall, patient unable to recollect.  Of note he was seen in the ED 2 days prior to admission diagnosed with UTI sent home on Keflex. Underwent extensive work-up in the ED-with abnormal UA WBC more than 50, urine culture sent, CK 46, slightly + trops 40> 52, but without chest pain.  Chest x-ray normal, x-ray of the pelvis left hip lucency within the pubic symphysis on the left suspicious for nondisplaced fracture, subsequently underwent CT pelvis negative for fracture also had CT lumbar spine, CT cervical thoracic and lumbar spine,CT head-no acute fracture or subluxation, postop changes  In cervical areachronic height loss at L4, mild spinal canal stenosis at L4-5.  He has managed with pain control seen by neurosurgery no acute intervention advised and advised outpatient follow-up for possible injection pain control. Patient abdominal distention but asymptomatic having bowel movement.  Worked with PT OT and has advised skilled nursing facility.  At this time he is overall improved.  Initially declined skilled nursing facility but subsequently agreed  Subjective: Resting comfortably.  He is having his meal passing gas denies any complaint, pain is relatively stable Overnight no fever.  Blood pressure borderline controlled.   He has been taking oxycodone for pain control for low back pain.  Assessment & Plan:  Unwitnessed fall/concern for syncope??:  Likely  fall rather than  syncope.  Per daughter he fell Wednesday 3 am-patient had told her that he was trying to get up to go the bathroom and moved while moving from edge of bed he fell around 3 am and she received call around 3.25. He had crawled to the bathroom and was on the floor when daughter arrived- EMS was called and brought back to ED, he had visited on Tuesday and snt home on antibiotics. Suspect fall in the setting of UTI and dehydration and back pain,nonfocal on exam.  No chest pain had mildly positive troponin D-dimer 0.6 at this time no other evidence of PE 2D echocardiogram was done normal EF no other acute finding.EEG shows normal no epileptiform discharges.  Carotid duplex no significant stenosis.at this time we will do skilled nursing facility placement.    Chronic back pain/left leg pain:Previous MRI in September 2021 with moderately spinal stenosis L3-L4, L4-L5 and severe subarticular stenosis on the left due to facet hypertrophy and left paracentral disc protrusion.  CT cervical spine, thoracic and lumbar spine no acute finding.  Seen by neurosurgery at this time no need for acute intervention and will need outpatient follow-up-possible injection.  Continue lidocaine patch, muscle relaxant and pain control with oral opiates along with PT OT.  UTI POA recently  on Keflex.urine cultures negative likely from being on antibiotics.  CT abdomen showed increasing bilateral perinephric stranding as well as some hazy stranding adjacent previously identified bladder diverticula concerning for possible ascending UTI. Continue on ceftriaxone while here and change to po on D/C. Overall stable, afebrile w/ no leukocytosis. Recent Labs  Lab 05/25/20 2032 05/27/20 0143 05/28/20 0325 05/29/20 0156  WBC 8.8 8.3 5.9 5.8   Acute metabolic encephalopathy, poor historian but mental status appears to be stable at baseline. .nonfocal on exam CT head no acute finding.   TSH B12, ammonia level normal.  Continue PT OT  Mildly  borderline positive troponin,unclear etiology no chest pain no ischemic change on EKG D dimer 0.6-patient without chest pain shortness or tachycardia or any other signs of PE.  Corrected for his age D-dimer probably normal.  I discussed with patient extensively about this finding, patient feels comfortable and after discussion does not want to proceed with CTA at this time, is already anticoagulated on Xarelto.Very low suspicion for PE.  Checking duplex of the leg for completeness.  Abdomen distension/versus obesity: Had a bowel movement here. Continue MiraLAX as needed.CT abdomen on repeat shows no evidence of bowel obstruction.  Tolerating diet.  Insulin-dependent diabetes poorly controlled W/ hyperglycemia.  Hemoglobin A1c 7.5.  Blood sugars currently fairly stable on SSI/home Lantus, and monitor blood sugar. Recent Labs  Lab 05/27/20 2111 05/28/20 0746 05/28/20 1149 05/28/20 1525 05/28/20 2019  GLUCAP 262* 135* 191* 243* 246*   OSA continue CPAP nightly  Chronic diastolic CHF: No shortness of breath.  Volume status stable.  CAD history of PCI: He is on Xarelto and Crestor.  AKI on CKD stage IIIa, baseline creatinine 1.2.  Creatinine has improved to 1.1.  Monitor Recent Labs  Lab 05/25/20 2032 05/26/20 0728 05/27/20 0143  BUN 20 17 20   CREATININE 1.45* 1.17 1.18   Hyperbilirubinemia stable outpatient follow-up.  GERD continue PPI  Hyperlipidemia-continue crestor  Morbid obesity w/ BMI 34: He will benefit with weight loss and PCP follow-up.  Diet Order            Diet heart healthy/carb modified Room service appropriate? Yes with Assist; Fluid consistency: Thin  Diet effective now               Patient's Body mass index is 34.38 kg/m.  DVT prophylaxis: XARELTO Code Status:   Code Status: Full Code  Family Communication:Plan of care discussed with patient at bedside. I I had called and discussed with patient's daughter and they agree for skilled nursing facility ON  5/20.    Status is: Inpatient Remains hospitalized for ongoing management of his back pain, PTOT eval. Dispo: The patient is from: Home lives alone              Anticipated d/c is to:SNF likely over the weekend if bed available.[place] working.              Patient currently is medically stable for discharge   Difficult to place patient No   Unresulted Labs (From admission, onward)         None    Medications reviewed:  Scheduled Meds: . insulin aspart  0-15 Units Subcutaneous TID WC  . insulin aspart  0-5 Units Subcutaneous QHS  . insulin glargine  28 Units Subcutaneous Daily  . lidocaine  1 patch Transdermal Q24H  . pantoprazole  40 mg Oral q morning  . polyethylene glycol  17 g Oral Daily  . pregabalin  300 mg Oral BID  . rivaroxaban  20 mg Oral Daily  . rosuvastatin  10 mg Oral Daily   Continuous Infusions: . cefTRIAXone (ROCEPHIN)  IV 1 g (05/28/20 2158)    Consultants:see note  Procedures:  TTE 5/18 Left ventricular ejection fraction, by estimation, is 60 to 65%. The  left ventricle has normal function. The left ventricle has no  regional  wall motion abnormalities. Left ventricular diastolic parameters are  consistent with Grade I diastolic  dysfunction (impaired relaxation).  2. Right ventricular systolic function is normal. The right ventricular  size is normal. There is mildly elevated pulmonary artery systolic  pressure.  3. Left atrial size was mildly dilated.  4. Right atrial size was mildly dilated.  5. The mitral valve is normal in structure. No evidence of mitral valve  regurgitation. No evidence of mitral stenosis.  6. The aortic valve is normal in structure. There is mild calcification  of the aortic valve. Aortic valve regurgitation is trivial. No aortic  stenosis is present.  7. The inferior vena cava is normal in size with greater than 50%  respiratory variability, suggesting right atrial pressure of 3 mmHg   Antimicrobials: Anti-infectives  (From admission, onward)   Start     Dose/Rate Route Frequency Ordered Stop   05/26/20 2200  cefTRIAXone (ROCEPHIN) 1 g in sodium chloride 0.9 % 100 mL IVPB        1 g 200 mL/hr over 30 Minutes Intravenous Every 24 hours 05/26/20 0625     05/25/20 2300  cefTRIAXone (ROCEPHIN) 1 g in sodium chloride 0.9 % 100 mL IVPB        1 g 200 mL/hr over 30 Minutes Intravenous  Once 05/25/20 2246 05/25/20 2333     Culture/Microbiology    Component Value Date/Time   SDES URINE, RANDOM 05/25/2020 2156   [place] 2350 05/25/2020 2156   CULT  05/25/2020 2156    NO GROWTH Performed at [facility] [address], [place], [place] [address]    REPTSTATUS 05/27/2020 FINAL 05/25/2020 2156    Other culture-see note  Objective: Vitals: Today's Vitals   05/28/20 2145 05/28/20 2344 05/29/20 0405 05/29/20 0407  BP:   (!) 156/68   Pulse:  67 69   Resp:  14 20   Temp:   99.7 F (37.6 C)   TempSrc:   Axillary   SpO2:  99% 99%   Weight:      Height:      PainSc: 6    4     Intake/Output Summary (Last 24 hours) at 05/29/2020 0828 Last data filed at 05/28/2020 1908 Gross per 24 hour  Intake 840 ml  Output 400 ml  Net 440 ml   Filed Weights   05/25/20 2035 05/26/20 1541  Weight: 97.5 kg 93.7 kg   Weight change:   Intake/Output from previous day: 05/20 0701 - 05/21 0700 In: 1080 [P.O.:1080] Out: 400 [Urine:400] Intake/Output this shift: No intake/output data recorded. Filed Weights   05/25/20 2035 05/26/20 1541  Weight: 97.5 kg 93.7 kg    Examination: General exam: AAO at baseline not in distress, on room air, obese and older than stated age, weak appearing. HEENT:Oral mucosa moist, Ear/Nose WNL grossly, dentition normal. Respiratory system: bilaterally clear, no crackles or wheezing no use of accessory muscle Cardiovascular system: S1 & S2 +, No JVD,. Gastrointestinal system: Abdomen soft,NT,ND, BS+ Nervous System:Alert, awake, moving extremities and grossly  nonfocal Extremities: no edema, distal peripheral pulses palpable.  Skin: No rashes,no icterus. MSK: Normal muscle bulk,tone, power  Data Reviewed: I have personally reviewed following labs and imaging studies CBC: Recent Labs  Lab 05/25/20 2032 05/27/20 0143 05/28/20 0325 05/29/20 0156  WBC 8.8 8.3 5.9 5.8  NEUTROABS 6.6  --   --   --   HGB 13.9 12.7* 12.0* 12.0*  HCT 41.4 37.1* 35.3* 35.5*  MCV  93.9 92.5 91.9 91.7  PLT 162 160 169 742   Basic Metabolic Panel: Recent Labs  Lab 05/25/20 2032 05/26/20 0728 05/27/20 0143  NA 134* 136 133*  K 4.3 4.2 4.0  CL 100 104 101  CO2 25 22 24   GLUCOSE 358* 251* 168*  BUN 20 17 20   CREATININE 1.45* 1.17 1.18  CALCIUM 9.2 8.6* 8.5*   GFR: Estimated Creatinine Clearance: 54.3 mL/min (by C-G formula based on SCr of 1.18 mg/dL). Liver Function Tests: Recent Labs  Lab 05/25/20 2032 05/26/20 0728 05/27/20 0143  AST 25  --  23  22  ALT 25  --  19  19  ALKPHOS 65  --  48  47  BILITOT 1.7* 1.1 1.0  0.8  PROT 7.5  --  6.5  6.2*  ALBUMIN 3.7  --  2.9*  2.9*   Recent Labs  Lab 05/27/20 0143  LIPASE 37   Recent Labs  Lab 05/26/20 0728  AMMONIA 29   Coagulation Profile: No results for input(s): INR, PROTIME in the last 168 hours. Cardiac Enzymes: Recent Labs  Lab 05/25/20 2230  CKTOTAL 406*   BNP (last 3 results) No results for input(s): PROBNP in the last 8760 hours. HbA1C: Recent Labs    05/26/20 0914  HGBA1C 7.5*   CBG: Recent Labs  Lab 05/27/20 2111 05/28/20 0746 05/28/20 1149 05/28/20 1525 05/28/20 2019  GLUCAP 262* 135* 191* 243* 246*   Lipid Profile: No results for input(s): CHOL, HDL, LDLCALC, TRIG, CHOLHDL, LDLDIRECT in the last 72 hours. Thyroid Function Tests: No results for input(s): TSH, T4TOTAL, FREET4, T3FREE, THYROIDAB in the last 72 hours. Anemia Panel: No results for input(s): VITAMINB12, FOLATE, FERRITIN, TIBC, IRON, RETICCTPCT in the last 72 hours. Sepsis Labs: No results for  input(s): PROCALCITON, LATICACIDVEN in the last 168 hours.  Recent Results (from the past 240 hour(s))  Resp Panel by RT-PCR (Flu A&B, Covid) Nasopharyngeal Swab     Status: None   Collection Time: 05/25/20  9:38 PM   Specimen: Nasopharyngeal Swab; Nasopharyngeal(NP) swabs in vial transport medium  Result Value Ref Range Status   SARS Coronavirus 2 by RT PCR NEGATIVE NEGATIVE Final    Comment: (NOTE) SARS-CoV-2 target nucleic acids are NOT DETECTED.  The SARS-CoV-2 RNA is generally detectable in upper respiratory specimens during the acute phase of infection. The lowest concentration of SARS-CoV-2 viral copies this assay can detect is 138 copies/mL. A negative result does not preclude SARS-Cov-2 infection and should not be used as the sole basis for treatment or other patient management decisions. A negative result may occur with  improper specimen collection/handling, submission of specimen other than nasopharyngeal swab, presence of viral mutation(s) within the areas targeted by this assay, and inadequate number of viral copies(<138 copies/mL). A negative result must be combined with clinical observations, patient history, and epidemiological information. The expected result is Negative.  Fact Sheet for Patients:  EntrepreneurPulse.com.au  Fact Sheet for Healthcare Providers:  IncredibleEmployment.be  This test is no t yet approved or cleared by the [place] FDA and  has been authorized for detection and/or diagnosis of SARS-CoV-2 by FDA under an Emergency Use Authorization (EUA). This EUA will remain  in effect (meaning this test can be used) for the duration of the COVID-19 declaration under Section 564(b)(1) of the Act, 21 U.S.C.section 360bbb-3(b)(1), unless the authorization is terminated  or revoked sooner.       Influenza A by PCR NEGATIVE NEGATIVE Final   Influenza B by PCR  NEGATIVE NEGATIVE Final    Comment: (NOTE) The  Xpert Xpress SARS-CoV-2/FLU/RSV plus assay is intended as an aid in the diagnosis of influenza from Nasopharyngeal swab specimens and should not be used as a sole basis for treatment. Nasal washings and aspirates are unacceptable for Xpert Xpress SARS-CoV-2/FLU/RSV testing.  Fact Sheet for Patients: EntrepreneurPulse.com.au  Fact Sheet for Healthcare Providers: IncredibleEmployment.be  This test is not yet approved or cleared by the [place] FDA and has been authorized for detection and/or diagnosis of SARS-CoV-2 by FDA under an Emergency Use Authorization (EUA). This EUA will remain in effect (meaning this test can be used) for the duration of the COVID-19 declaration under Section 564(b)(1) of the Act, 21 U.S.C. section 360bbb-3(b)(1), unless the authorization is terminated or revoked.  Performed at [facility], [place] [address], [place], [place] [address]   Urine culture     Status: None   Collection Time: 05/25/20  9:56 PM   Specimen: Urine, Random  Result Value Ref Range Status   Specimen Description URINE, RANDOM  Final   Special Requests ADDED 2350  Final   Culture   Final    NO GROWTH Performed at [facility], [place] [address] [place], [place] [address]    Report Status 05/27/2020 FINAL  Final     Radiology Studies: VAS [place] LOWER EXTREMITY VENOUS (DVT)  Result Date: 05/28/2020  Lower Venous DVT Study Patient Name:  KVEON DECRESCENZO  Date of Exam:   05/28/2020 Medical Rec #: [id number]         Accession #:    [id number] Date of Birth: 04-Dec-1940          Patient Gender: M Patient Age:   80Y Exam Location:  [facility] Procedure:      VAS [place] LOWER EXTREMITY VENOUS (DVT) Referring Phys: [id number] [place] --------------------------------------------------------------------------------  Indications: Swelling.  Comparison Study: No previous exams Performing Technologist: Rogelia Rohrer  Examination Guidelines: A complete  evaluation includes B-mode imaging, spectral Doppler, color Doppler, and power Doppler as needed of all accessible portions of each vessel. Bilateral testing is considered an integral part of a complete examination. Limited examinations for reoccurring indications may be performed as noted. The reflux portion of the exam is performed with the patient in reverse Trendelenburg.  +---------+---------------+---------+-----------+----------+--------------+ RIGHT    CompressibilityPhasicitySpontaneityPropertiesThrombus Aging +---------+---------------+---------+-----------+----------+--------------+ CFV      Full           Yes      Yes                                 +---------+---------------+---------+-----------+----------+--------------+ SFJ      Full                                                        +---------+---------------+---------+-----------+----------+--------------+ FV Prox  Full           Yes      Yes                                 +---------+---------------+---------+-----------+----------+--------------+ FV Mid   Full           Yes      Yes                                 +---------+---------------+---------+-----------+----------+--------------+  FV DistalFull           Yes      Yes                                 +---------+---------------+---------+-----------+----------+--------------+ PFV      Full                                                        +---------+---------------+---------+-----------+----------+--------------+ POP      Full           Yes      Yes                                 +---------+---------------+---------+-----------+----------+--------------+ PTV      Full                                                        +---------+---------------+---------+-----------+----------+--------------+ PERO     Full                                                         +---------+---------------+---------+-----------+----------+--------------+   +---------+---------------+---------+-----------+----------+--------------+ LEFT     CompressibilityPhasicitySpontaneityPropertiesThrombus Aging +---------+---------------+---------+-----------+----------+--------------+ CFV      Full           Yes      Yes                                 +---------+---------------+---------+-----------+----------+--------------+ SFJ      Full                                                        +---------+---------------+---------+-----------+----------+--------------+ FV Prox  Full           Yes      Yes                                 +---------+---------------+---------+-----------+----------+--------------+ FV Mid   Full           Yes      Yes                                 +---------+---------------+---------+-----------+----------+--------------+ FV DistalFull           Yes      Yes                                 +---------+---------------+---------+-----------+----------+--------------+ PFV      Full                                                        +---------+---------------+---------+-----------+----------+--------------+  POP      Full           Yes      Yes                                 +---------+---------------+---------+-----------+----------+--------------+ PTV      Full                                                        +---------+---------------+---------+-----------+----------+--------------+ PERO     Full                                                        +---------+---------------+---------+-----------+----------+--------------+     Summary: BILATERAL: - No evidence of deep vein thrombosis seen in the lower extremities, bilaterally. - No evidence of superficial venous thrombosis in the lower extremities, bilaterally. -No evidence of popliteal cyst, bilaterally.   *See table(s) above for measurements  and observations. Electronically signed by Deitra Mayo MD on 05/28/2020 at 8:13:57 PM.    Final      LOS: 2 days   Antonieta Pert, MD [facility]  05/29/2020, 8:28 AM

## 2020-05-29 NOTE — TOC Progression Note (Signed)
Transition of Care [facility]) - Progression Note    Patient Details  Name: Fontaine Hehl MRN: [id number] Date of Birth: 1940-04-28  Transition of Care [facility]) CM/SW [place], [place] Phone Number: [phone] 05/29/2020, 12:27 PM  Clinical Narrative:     CSW attempted to reach [place] at [facility] in regards to bed offer however VM is full and had to send a text CSW awaiting a call back.  TOC team will continue to assist with discharge planning needs.   Expected Discharge Plan: Skilled Nursing Facility Barriers to Discharge: No Barriers Identified  Expected Discharge Plan and Services Expected Discharge Plan: [place] In-house Referral: Clinical Social Work                                             Social Determinants of Health (SDOH) Interventions    Readmission Risk Interventions No flowsheet data found.

## 2020-05-29 NOTE — TOC Progression Note (Signed)
Transition of Care [facility]) - Progression Note    Patient Details  Name: Steve Brown MRN: [id number] Date of Birth: 1940-12-23  Transition of Care [facility]) CM/SW [place],  Phone Number: [phone] 05/29/2020, 2:52 PM  Clinical Narrative:     CSW received return call from [facility] and she confirmed bed for Monday. Pt will need a COVID on Sunday for possible discharge on Monday.   TOC team will continue to assist with discharge planning needs.   Expected Discharge Plan: Skilled Nursing Facility Barriers to Discharge: No Barriers Identified  Expected Discharge Plan and Services Expected Discharge Plan: [place] In-house Referral: Clinical Social Work                                             Social Determinants of Health (SDOH) Interventions    Readmission Risk Interventions No flowsheet data found.

## 2020-05-30 DIAGNOSIS — R55 Syncope and collapse: Secondary | ICD-10-CM | POA: Diagnosis not present

## 2020-05-30 LAB — GLUCOSE, CAPILLARY
Glucose-Capillary: 311 mg/dL — ABNORMAL HIGH (ref 70–99)
Glucose-Capillary: 221 mg/dL — ABNORMAL HIGH (ref 70–99)
Glucose-Capillary: 180 mg/dL — ABNORMAL HIGH (ref 70–99)
Glucose-Capillary: 219 mg/dL — ABNORMAL HIGH (ref 70–99)
Glucose-Capillary: 236 mg/dL — ABNORMAL HIGH (ref 70–99)

## 2020-05-30 MED ORDER — INSULIN GLARGINE 100 UNIT/ML ~~LOC~~ SOLN
34.0000 [IU] | Freq: Every day | SUBCUTANEOUS | Status: DC
  Administered 2020-05-30 – 2020-05-31 (×2): 34 [IU] via SUBCUTANEOUS
  Filled 2020-05-30 (×2): qty 0.34

## 2020-05-30 MED ORDER — BISACODYL 10 MG RE SUPP
10.0000 mg | Freq: Once | RECTAL | Status: DC
  Filled 2020-05-30: qty 1

## 2020-05-30 NOTE — Progress Notes (Signed)
PROGRESS NOTE    Steve Brown  FTD:[id number] DOB: 1940-07-22 DOA: 05/25/2020 PCP: Lawerance Cruel, MD   Chief Complaint  Patient presents with  . Fall  Brief Narrative: 80 year old male with multiple complex comorbidities with CVA, insulin-dependent diabetes, CAD status post PCI, chronic diastolic CHF, OSA on CPAP, HTN, HLD, PE history on Xarelto, GERD, depression, CKD stage IIIa, BPH presented after a fall unwitnessed at home.  He was complaining of back pain left thigh/hip pain and brought to the ED after unwitnessed fall, patient unable to recollect.  Of note he was seen in the ED 2 days prior to admission diagnosed with UTI sent home on Keflex. Underwent extensive work-up in the ED-with abnormal UA WBC more than 50, urine culture sent, CK 46, slightly + trops 40> 52, but without chest pain.  Chest x-ray normal, x-ray of the pelvis left hip lucency within the pubic symphysis on the left suspicious for nondisplaced fracture, subsequently underwent CT pelvis negative for fracture also had CT lumbar spine, CT cervical thoracic and lumbar spine,CT head-no acute fracture or subluxation, postop changes  In cervical areachronic height loss at L4, mild spinal canal stenosis at L4-5.  He has managed with pain control seen by neurosurgery no acute intervention advised and advised outpatient follow-up for possible injection pain control. Patient abdominal distention but asymptomatic having bowel movement.  Worked with PT OT and has advised skilled nursing facility.  At this time he is overall improved.  Initially declined skilled nursing facility but subsequently agreed  Subjective: Resting well Overnight no acute events.  Blood pressure stable sugar on higher side 311  Afebrile No BM yesterday but no abd distention or pain,  Passing flatus Assessment & Plan:  Unwitnessed fall/concern for syncope??:  Likely  fall rather than syncope.  Per daughter he fell Wednesday 3 am-patient had told her that  he was trying to get up to go the bathroom and moved while moving from edge of bed he fell around 3 am and she received call around 3.25. He had crawled to the bathroom and was on the floor when daughter arrived- EMS was called and brought back to ED, he had visited on Tuesday and snt home on antibiotics. Suspect fall in the setting of UTI and dehydration and back pain,nonfocal on exam.  No chest pain had mildly positive troponin D-dimer 0.6 at this time no other evidence of PE 2D echocardiogram was done normal EF no other acute finding.EEG shows normal no epileptiform discharges.  Carotid duplex no significant stenosis.at this time we will do skilled nursing facility placement.    Chronic back pain/left leg pain:Previous MRI in September 2021 with moderately spinal stenosis L3-L4, L4-L5 and severe subarticular stenosis on the left due to facet hypertrophy and left paracentral disc protrusion.  CT cervical spine, thoracic and lumbar spine no acute finding.  Seen by neurosurgery at this time no need for acute intervention and will need outpatient follow-up-possible injection.  Continue lidocaine patch, muscle relaxant and pain control with oral opiates along with PT OT.  UTI POA recently  on Keflex.urine cultures negative likely from being on antibiotics.  CT abdomen showed increasing bilateral perinephric stranding as well as some hazy stranding adjacent previously identified bladder diverticula concerning for possible ascending UTI.  Completed course of ceftriaxone.  Monitor WBC count or any symptoms.  Recent Labs  Lab 05/25/20 2032 05/27/20 0143 05/28/20 0325 05/29/20 0156  WBC 8.8 8.3 5.9 5.8   Acute metabolic encephalopathy, poor historian but mental status  appears to be stable at baseline. nonfocal on exam CT head no acute finding.   TSH B12, ammonia level normal.  Continue PT OT.  At this time pending placement  Mildly borderline positive troponin,unclear etiology no chest pain no ischemic  change on EKG D dimer 0.6-patient without chest pain shortness or tachycardia or any other signs of PE.  Corrected for his age D-dimer probably normal.  I discussed with patient extensively about this finding, patient feels comfortable and after discussion does not want to proceed with CTA at this time, is already anticoagulated on Xarelto.Very low suspicion for PE.  Duplex done for completeness and no DVT bilateral lower extremities   Abdomen distension/versus obesity: Had a bowel movement here. CT abdomen on repeat shows no evidence of bowel obstruction.  Tolerating diet. We will try prn dulcolax for constipation today, cont on miralax daily.  Insulin-dependent diabetes poorly controlled W/ hyperglycemia.  Hemoglobin A1c 7.5.  Blood sugar has been fluctuating .  Increase Lantus to 30 units, continue sliding scale insulin and monitor.   Recent Labs  Lab 05/29/20 0836 05/29/20 1158 05/29/20 1558 05/29/20 2128 05/30/20 0728  GLUCAP 243* 237* 244* 207* 311*   OSA on CPAP nightly  Chronic diastolic CHF: Compensated.  CAD history of PCI: He is on Xarelto and Crestor.  He does not have chest pain  AKI on CKD stage IIIa, baseline creatinine 1.2.  Renal function has improved. monitor Recent Labs  Lab 05/25/20 2032 05/26/20 0728 05/27/20 0143  BUN 20 17 20   CREATININE 1.45* 1.17 1.18   Hyperbilirubinemia stable outpatient follow-up.  GERD On PPI  Hyperlipidemia-On crestor  Morbid obesity w/ BMI 34: He will benefit with weight loss and PCP follow-up.  Diet Order            Diet heart healthy/carb modified Room service appropriate? Yes with Assist; Fluid consistency: Thin  Diet effective now               Patient's Body mass index is 34.38 kg/m.  DVT prophylaxis: XARELTO Code Status:   Code Status: Full Code  Family Communication:Plan of care discussed with patient at bedside. I I had called and discussed with patient's daughter and they agree for skilled nursing facility ON  5/20.    Status is: Inpatient Remains hospitalized for ongoing management of his back pain, PTOT eval. Dispo: The patient is from: Home lives alone              Anticipated d/c is to:SNF  Once bed available.[facility] working.              Patient currently is medically stable for discharge   Difficult to place patient No   Unresulted Labs (From admission, onward)         None    Medications reviewed:  Scheduled Meds: . insulin aspart  0-15 Units Subcutaneous TID WC  . insulin aspart  0-5 Units Subcutaneous QHS  . insulin glargine  28 Units Subcutaneous Daily  . lidocaine  1 patch Transdermal Q24H  . pantoprazole  40 mg Oral q morning  . polyethylene glycol  17 g Oral Daily  . pregabalin  300 mg Oral BID  . rivaroxaban  20 mg Oral Daily  . rosuvastatin  10 mg Oral Daily   Continuous Infusions:   Consultants:see note  Procedures:  TTE 5/18 Left ventricular ejection fraction, by estimation, is 60 to 65%. The  left ventricle has normal function. The left ventricle has no regional  wall  motion abnormalities. Left ventricular diastolic parameters are  consistent with Grade I diastolic  dysfunction (impaired relaxation).  2. Right ventricular systolic function is normal. The right ventricular  size is normal. There is mildly elevated pulmonary artery systolic  pressure.  3. Left atrial size was mildly dilated.  4. Right atrial size was mildly dilated.  5. The mitral valve is normal in structure. No evidence of mitral valve  regurgitation. No evidence of mitral stenosis.  6. The aortic valve is normal in structure. There is mild calcification  of the aortic valve. Aortic valve regurgitation is trivial. No aortic  stenosis is present.  7. The inferior vena cava is normal in size with greater than 50%  respiratory variability, suggesting right atrial pressure of 3 mmHg   Antimicrobials: Anti-infectives (From admission, onward)   Start     Dose/Rate Route Frequency Ordered  Stop   05/26/20 2200  cefTRIAXone (ROCEPHIN) 1 g in sodium chloride 0.9 % 100 mL IVPB        1 g 200 mL/hr over 30 Minutes Intravenous Every 24 hours 05/26/20 0625 05/29/20 2312   05/25/20 2300  cefTRIAXone (ROCEPHIN) 1 g in sodium chloride 0.9 % 100 mL IVPB        1 g 200 mL/hr over 30 Minutes Intravenous  Once 05/25/20 2246 05/25/20 2333     Culture/Microbiology    Component Value Date/Time   SDES URINE, RANDOM 05/25/2020 2156   Fidelity 2350 05/25/2020 2156   CULT  05/25/2020 2156    NO GROWTH Performed at [facility] [address] [place], [place] [address]    REPTSTATUS 05/27/2020 FINAL 05/25/2020 2156    Other culture-see note  Objective: Vitals: Today's Vitals   05/29/20 1931 05/29/20 1943 05/29/20 2317 05/30/20 0444  BP: (!) 173/87 (!) 160/90  (!) 159/71  Pulse: 68 74  64  Resp: 19 20  19   Temp: 98.6 F (37 C) 98.9 F (37.2 C)  98.4 F (36.9 C)  TempSrc: Oral Oral  Oral  SpO2: 96% 93%  95%  Weight:      Height:      PainSc:  6  Asleep     Intake/Output Summary (Last 24 hours) at 05/30/2020 0841 Last data filed at 05/29/2020 1330 Gross per 24 hour  Intake 240 ml  Output --  Net 240 ml   Filed Weights   05/25/20 2035 05/26/20 1541  Weight: 97.5 kg 93.7 kg   Weight change:   Intake/Output from previous day: 05/21 0701 - 05/22 0700 In: 360 [P.O.:360] Out: 1000 [Urine:1000] Intake/Output this shift: No intake/output data recorded. Filed Weights   05/25/20 2035 05/26/20 1541  Weight: 97.5 kg 93.7 kg    Examination: General exam: AAOx place, president day, older than stated age, weak appearing. HEENT:Oral mucosa moist, Ear/Nose WNL grossly, dentition normal. Respiratory system: bilaterally clear, no use of accessory muscle Cardiovascular system: S1 & S2 +, No JVD,. Gastrointestinal system: Abdomen soft,obese, non tender, BS+ Nervous System:Alert, awake, moving extremities and grossly nonfocal Extremities: no edema, distal  peripheral pulses palpable.  Skin: No rashes,no icterus. MSK: Normal muscle bulk,tone, power  Data Reviewed: I have personally reviewed following labs and imaging studies CBC: Recent Labs  Lab 05/25/20 2032 05/27/20 0143 05/28/20 0325 05/29/20 0156  WBC 8.8 8.3 5.9 5.8  NEUTROABS 6.6  --   --   --   HGB 13.9 12.7* 12.0* 12.0*  HCT 41.4 37.1* 35.3* 35.5*  MCV 93.9 92.5 91.9 91.7  PLT  162 160 169 009   Basic Metabolic Panel: Recent Labs  Lab 05/25/20 2032 05/26/20 0728 05/27/20 0143  NA 134* 136 133*  K 4.3 4.2 4.0  CL 100 104 101  CO2 25 22 24   GLUCOSE 358* 251* 168*  BUN 20 17 20   CREATININE 1.45* 1.17 1.18  CALCIUM 9.2 8.6* 8.5*   GFR: Estimated Creatinine Clearance: 54.3 mL/min (by C-G formula based on SCr of 1.18 mg/dL). Liver Function Tests: Recent Labs  Lab 05/25/20 2032 05/26/20 0728 05/27/20 0143  AST 25  --  23  22  ALT 25  --  19  19  ALKPHOS 65  --  48  47  BILITOT 1.7* 1.1 1.0  0.8  PROT 7.5  --  6.5  6.2*  ALBUMIN 3.7  --  2.9*  2.9*   Recent Labs  Lab 05/27/20 0143  LIPASE 37   Recent Labs  Lab 05/26/20 0728  AMMONIA 29   Coagulation Profile: No results for input(s): INR, PROTIME in the last 168 hours. Cardiac Enzymes: Recent Labs  Lab 05/25/20 2230  CKTOTAL 406*   BNP (last 3 results) No results for input(s): PROBNP in the last 8760 hours. HbA1C: No results for input(s): HGBA1C in the last 72 hours. CBG: Recent Labs  Lab 05/29/20 0836 05/29/20 1158 05/29/20 1558 05/29/20 2128 05/30/20 0728  GLUCAP 243* 237* 244* 207* 311*   Lipid Profile: No results for input(s): CHOL, HDL, LDLCALC, TRIG, CHOLHDL, LDLDIRECT in the last 72 hours. Thyroid Function Tests: No results for input(s): TSH, T4TOTAL, FREET4, T3FREE, THYROIDAB in the last 72 hours. Anemia Panel: No results for input(s): VITAMINB12, FOLATE, FERRITIN, TIBC, IRON, RETICCTPCT in the last 72 hours. Sepsis Labs: No results for input(s): PROCALCITON,  LATICACIDVEN in the last 168 hours.  Recent Results (from the past 240 hour(s))  Resp Panel by RT-PCR (Flu A&B, Covid) Nasopharyngeal Swab     Status: None   Collection Time: 05/25/20  9:38 PM   Specimen: Nasopharyngeal Swab; Nasopharyngeal(NP) swabs in vial transport medium  Result Value Ref Range Status   SARS Coronavirus 2 by RT PCR NEGATIVE NEGATIVE Final    Comment: (NOTE) SARS-CoV-2 target nucleic acids are NOT DETECTED.  The SARS-CoV-2 RNA is generally detectable in upper respiratory specimens during the acute phase of infection. The lowest concentration of SARS-CoV-2 viral copies this assay can detect is 138 copies/mL. A negative result does not preclude SARS-Cov-2 infection and should not be used as the sole basis for treatment or other patient management decisions. A negative result may occur with  improper specimen collection/handling, submission of specimen other than nasopharyngeal swab, presence of viral mutation(s) within the areas targeted by this assay, and inadequate number of viral copies(<138 copies/mL). A negative result must be combined with clinical observations, patient history, and epidemiological information. The expected result is Negative.  Fact Sheet for Patients:  EntrepreneurPulse.com.au  Fact Sheet for Healthcare Providers:  IncredibleEmployment.be  This test is no t yet approved or cleared by the [place] FDA and  has been authorized for detection and/or diagnosis of SARS-CoV-2 by FDA under an Emergency Use Authorization (EUA). This EUA will remain  in effect (meaning this test can be used) for the duration of the COVID-19 declaration under Section 564(b)(1) of the Act, 21 U.S.C.section 360bbb-3(b)(1), unless the authorization is terminated  or revoked sooner.       Influenza A by PCR NEGATIVE NEGATIVE Final   Influenza B by PCR NEGATIVE NEGATIVE Final    Comment: (NOTE)  The Xpert Xpress  SARS-CoV-2/FLU/RSV plus assay is intended as an aid in the diagnosis of influenza from Nasopharyngeal swab specimens and should not be used as a sole basis for treatment. Nasal washings and aspirates are unacceptable for Xpert Xpress SARS-CoV-2/FLU/RSV testing.  Fact Sheet for Patients: EntrepreneurPulse.com.au  Fact Sheet for Healthcare Providers: IncredibleEmployment.be  This test is not yet approved or cleared by the [place] FDA and has been authorized for detection and/or diagnosis of SARS-CoV-2 by FDA under an Emergency Use Authorization (EUA). This EUA will remain in effect (meaning this test can be used) for the duration of the COVID-19 declaration under Section 564(b)(1) of the Act, 21 U.S.C. section 360bbb-3(b)(1), unless the authorization is terminated or revoked.  Performed at [facility], [place] [address], [place], [place] [address]   Urine culture     Status: None   Collection Time: 05/25/20  9:56 PM   Specimen: Urine, Random  Result Value Ref Range Status   Specimen Description URINE, RANDOM  Final   Special Requests ADDED 2350  Final   Culture   Final    NO GROWTH Performed at [facility], [place] [address], [place], [place] [address]    Report Status 05/27/2020 FINAL  Final     Radiology Studies: VAS [place] LOWER EXTREMITY VENOUS (DVT)  Result Date: 05/28/2020  Lower Venous DVT Study Patient Name:  Steve Brown  Date of Exam:   05/28/2020 Medical Rec #: [id number]         Accession #:    [id number] Date of Birth: 25-Mar-1940          Patient Gender: M Patient Age:   80Y Exam Location:  [facility] Procedure:      VAS [place] LOWER EXTREMITY VENOUS (DVT) Referring Phys: [id number] [place] --------------------------------------------------------------------------------  Indications: Swelling.  Comparison Study: No previous exams Performing Technologist: Rogelia Rohrer  Examination Guidelines: A complete evaluation  includes B-mode imaging, spectral Doppler, color Doppler, and power Doppler as needed of all accessible portions of each vessel. Bilateral testing is considered an integral part of a complete examination. Limited examinations for reoccurring indications may be performed as noted. The reflux portion of the exam is performed with the patient in reverse Trendelenburg.  +---------+---------------+---------+-----------+----------+--------------+ RIGHT    CompressibilityPhasicitySpontaneityPropertiesThrombus Aging +---------+---------------+---------+-----------+----------+--------------+ CFV      Full           Yes      Yes                                 +---------+---------------+---------+-----------+----------+--------------+ SFJ      Full                                                        +---------+---------------+---------+-----------+----------+--------------+ FV Prox  Full           Yes      Yes                                 +---------+---------------+---------+-----------+----------+--------------+ FV Mid   Full           Yes      Yes                                 +---------+---------------+---------+-----------+----------+--------------+  FV DistalFull           Yes      Yes                                 +---------+---------------+---------+-----------+----------+--------------+ PFV      Full                                                        +---------+---------------+---------+-----------+----------+--------------+ POP      Full           Yes      Yes                                 +---------+---------------+---------+-----------+----------+--------------+ PTV      Full                                                        +---------+---------------+---------+-----------+----------+--------------+ PERO     Full                                                         +---------+---------------+---------+-----------+----------+--------------+   +---------+---------------+---------+-----------+----------+--------------+ LEFT     CompressibilityPhasicitySpontaneityPropertiesThrombus Aging +---------+---------------+---------+-----------+----------+--------------+ CFV      Full           Yes      Yes                                 +---------+---------------+---------+-----------+----------+--------------+ SFJ      Full                                                        +---------+---------------+---------+-----------+----------+--------------+ FV Prox  Full           Yes      Yes                                 +---------+---------------+---------+-----------+----------+--------------+ FV Mid   Full           Yes      Yes                                 +---------+---------------+---------+-----------+----------+--------------+ FV DistalFull           Yes      Yes                                 +---------+---------------+---------+-----------+----------+--------------+ PFV      Full                                                        +---------+---------------+---------+-----------+----------+--------------+  POP      Full           Yes      Yes                                 +---------+---------------+---------+-----------+----------+--------------+ PTV      Full                                                        +---------+---------------+---------+-----------+----------+--------------+ PERO     Full                                                        +---------+---------------+---------+-----------+----------+--------------+     Summary: BILATERAL: - No evidence of deep vein thrombosis seen in the lower extremities, bilaterally. - No evidence of superficial venous thrombosis in the lower extremities, bilaterally. -No evidence of popliteal cyst, bilaterally.   *See table(s) above for measurements  and observations. Electronically signed by Deitra Mayo MD on 05/28/2020 at 8:13:57 PM.    Final      LOS: 3 days   Antonieta Pert, MD [facility]  05/30/2020, 8:41 AM

## 2020-05-31 ENCOUNTER — Ambulatory Visit: Payer: Medicare Other

## 2020-05-31 DIAGNOSIS — E1149 Type 2 diabetes mellitus with other diabetic neurological complication: Secondary | ICD-10-CM | POA: Diagnosis not present

## 2020-05-31 DIAGNOSIS — R2681 Unsteadiness on feet: Secondary | ICD-10-CM | POA: Diagnosis not present

## 2020-05-31 DIAGNOSIS — M5442 Lumbago with sciatica, left side: Secondary | ICD-10-CM | POA: Diagnosis not present

## 2020-05-31 DIAGNOSIS — E1140 Type 2 diabetes mellitus with diabetic neuropathy, unspecified: Secondary | ICD-10-CM | POA: Diagnosis not present

## 2020-05-31 DIAGNOSIS — M48061 Spinal stenosis, lumbar region without neurogenic claudication: Secondary | ICD-10-CM | POA: Diagnosis not present

## 2020-05-31 DIAGNOSIS — M6281 Muscle weakness (generalized): Secondary | ICD-10-CM | POA: Diagnosis not present

## 2020-05-31 DIAGNOSIS — I10 Essential (primary) hypertension: Secondary | ICD-10-CM | POA: Diagnosis not present

## 2020-05-31 DIAGNOSIS — G8929 Other chronic pain: Secondary | ICD-10-CM | POA: Diagnosis not present

## 2020-05-31 DIAGNOSIS — G9341 Metabolic encephalopathy: Secondary | ICD-10-CM | POA: Diagnosis not present

## 2020-05-31 DIAGNOSIS — N183 Chronic kidney disease, stage 3 unspecified: Secondary | ICD-10-CM | POA: Diagnosis not present

## 2020-05-31 DIAGNOSIS — J069 Acute upper respiratory infection, unspecified: Secondary | ICD-10-CM | POA: Diagnosis not present

## 2020-05-31 DIAGNOSIS — E782 Mixed hyperlipidemia: Secondary | ICD-10-CM | POA: Diagnosis not present

## 2020-05-31 DIAGNOSIS — E118 Type 2 diabetes mellitus with unspecified complications: Secondary | ICD-10-CM | POA: Diagnosis not present

## 2020-05-31 DIAGNOSIS — N390 Urinary tract infection, site not specified: Secondary | ICD-10-CM | POA: Diagnosis not present

## 2020-05-31 DIAGNOSIS — G4733 Obstructive sleep apnea (adult) (pediatric): Secondary | ICD-10-CM | POA: Diagnosis not present

## 2020-05-31 DIAGNOSIS — R41841 Cognitive communication deficit: Secondary | ICD-10-CM | POA: Diagnosis not present

## 2020-05-31 DIAGNOSIS — R296 Repeated falls: Secondary | ICD-10-CM | POA: Diagnosis not present

## 2020-05-31 DIAGNOSIS — K219 Gastro-esophageal reflux disease without esophagitis: Secondary | ICD-10-CM | POA: Diagnosis not present

## 2020-05-31 DIAGNOSIS — R278 Other lack of coordination: Secondary | ICD-10-CM | POA: Diagnosis not present

## 2020-05-31 DIAGNOSIS — I2510 Atherosclerotic heart disease of native coronary artery without angina pectoris: Secondary | ICD-10-CM | POA: Diagnosis not present

## 2020-05-31 DIAGNOSIS — Z03818 Encounter for observation for suspected exposure to other biological agents ruled out: Secondary | ICD-10-CM | POA: Diagnosis not present

## 2020-05-31 DIAGNOSIS — I69051 Hemiplegia and hemiparesis following nontraumatic subarachnoid hemorrhage affecting right dominant side: Secondary | ICD-10-CM | POA: Diagnosis not present

## 2020-05-31 DIAGNOSIS — F329 Major depressive disorder, single episode, unspecified: Secondary | ICD-10-CM | POA: Diagnosis not present

## 2020-05-31 DIAGNOSIS — D649 Anemia, unspecified: Secondary | ICD-10-CM | POA: Diagnosis not present

## 2020-05-31 LAB — GLUCOSE, CAPILLARY
Glucose-Capillary: 202 mg/dL — ABNORMAL HIGH (ref 70–99)
Glucose-Capillary: 285 mg/dL — ABNORMAL HIGH (ref 70–99)
Glucose-Capillary: 175 mg/dL — ABNORMAL HIGH (ref 70–99)

## 2020-05-31 LAB — SARS CORONAVIRUS 2 (TAT 6-24 HRS): SARS Coronavirus 2: NEGATIVE

## 2020-05-31 MED ORDER — METHOCARBAMOL 500 MG PO TABS: 500.0000 mg | ORAL_TABLET | Freq: Three times a day (TID) | ORAL | Status: DC | PRN

## 2020-05-31 MED ORDER — RIVAROXABAN 20 MG PO TABS
20.0000 mg | ORAL_TABLET | Freq: Every day | ORAL | Status: DC
  Administered 2020-05-31: 20 mg via ORAL
  Filled 2020-05-31: qty 1

## 2020-05-31 MED ORDER — POLYETHYLENE GLYCOL 3350 17 G PO PACK: 17.0000 g | PACK | Freq: Every day | ORAL | 0 refills | Status: DC

## 2020-05-31 MED ORDER — LIDOCAINE 5 % EX PTCH: 1.0000 | MEDICATED_PATCH | CUTANEOUS | 0 refills | Status: DC

## 2020-05-31 MED ORDER — OXYCODONE HCL 5 MG PO TABS: 5.0000 mg | ORAL_TABLET | Freq: Four times a day (QID) | ORAL | 0 refills | Status: DC | PRN

## 2020-05-31 MED ORDER — BISACODYL 10 MG RE SUPP: 10.0000 mg | Freq: Every day | RECTAL | 0 refills | Status: DC | PRN

## 2020-05-31 MED ORDER — TRESIBA FLEXTOUCH 100 UNIT/ML ~~LOC~~ SOPN: 34.0000 [IU] | PEN_INJECTOR | Freq: Every day | SUBCUTANEOUS | Status: DC

## 2020-05-31 NOTE — Discharge Summary (Signed)
Physician Discharge Summary  Steve Brown QVZ:[id number] DOB: Aug 23, 1940 DOA: 05/25/2020  PCP: Lawerance Cruel, MD  Admit date: 05/25/2020 Discharge date: 05/31/2020  Admitted From: home Disposition:  SNF  Recommendations for Outpatient Follow-up:  1. Follow up with PCP in 1-2 weeks 2. Please obtain BMP/CBC in one week 3. Follow-up with neurosurgery for back pain issues.  Home Health:NO  Equipment/Devices: NONE  Discharge Condition: Stable Code Status:   Code Status: Full Code Diet recommendation:  Diet Order            Diet Carb Modified           Diet heart healthy/carb modified Room service appropriate? Yes with Assist; Fluid consistency: Thin  Diet effective now                  Brief/Interim Summary:  80 year old male with multiple complex comorbidities with CVA, insulin-dependent diabetes, CAD status post PCI, chronic diastolic CHF, OSA on CPAP, HTN, HLD, PE history on Xarelto, GERD, depression, CKD stage IIIa, BPH presented after a fall unwitnessed at home.  He was complaining of back pain left thigh/hip pain and brought to the ED after unwitnessed fall, patient unable to recollect.  Of note he was seen in the ED 2 days prior to admission diagnosed with UTI sent home on Keflex. Underwent extensive work-up in the ED-with abnormal UA WBC more than 50, urine culture sent, CK 46, slightly + trops 40> 52, but without chest pain.  Chest x-ray normal, x-ray of the pelvis left hip lucency within the pubic symphysis on the left suspicious for nondisplaced fracture, subsequently underwent CT pelvis negative for fracture also had CT lumbar spine, CT cervical thoracic and lumbar spine,CT head-no acute fracture or subluxation, postop changes  In cervical areachronic height loss at L4, mild spinal canal stenosis at L4-5.  He was admitted and pain was managed seen by neurosurgery- no acute intervention advised and advised outpatient follow-up for possible injection pain control. Patient  had abdominal distention but asymptomatic having bowel movement.  Worked with PT OT and has advised skilled nursing facility.  At this time he is overall improved.  Initially declined skilled nursing facility but subsequently agreed. Is medically stable for skilled nursing facility.  Discharge Diagnoses:  Unwitnessed fall/concern for syncope??:  Likely  fall rather than syncope.  Per daughter he fell Wednesday 3 am-patient had told her that he was trying to get up to go the bathroom and moved while moving from edge of bed he fell around 3 am and she received call around 3.25. He had crawled to the bathroom and was on the floor when daughter arrived- EMS was called and brought back to ED, he had visited on Tuesday and snt home on antibiotics. Suspect fall in the setting of UTI and dehydration and back pain,nonfocal on exam.  No chest pain had mildly positive troponin D-dimer 0.6 at this time no other evidence of PE 2D echocardiogram was done normal EF no other acute finding.EEG shows normal no epileptiform discharges.  Carotid duplex no significant stenosis.   Chronic back pain/left leg pain:Previous MRI in September 2021 with moderately spinal stenosis L3-L4, L4-L5 and severe subarticular stenosis on the left due to facet hypertrophy and left paracentral disc protrusion.  CT cervical spine, thoracic and lumbar spine no acute finding.  Seen by neurosurgery at this time no need for acute intervention and will need outpatient follow-up-possible injection.  Continue lidocaine patch, muscle relaxant and pain control with oral opiates along with PT  OT.  UTI POA recently  on Keflex.urine cultures negative likely from being on antibiotics.  CT abdomen showed increasing bilateral perinephric stranding as well as some hazy stranding adjacent previously identified bladder diverticula concerning for possible ascending UTI.   Patient completed antibiotics at this time he has no urinary symptoms or fever or  leukocytosis.   Acute metabolic encephalopathy, poor historian but mental status appears to be stable at baseline. nonfocal on exam CT head no acute finding.   TSH B12, ammonia level normal.  Continue PT OT.  At this time pending placement  Mildly borderline positive troponin,unclear etiology no chest pain no ischemic change on EKG D dimer 0.6-patient without chest pain shortness or tachycardia or any other signs of PE.  Corrected for his age D-dimer probably normal.  I discussed with patient extensively about this finding, patient feels comfortable and after discussion does not want to proceed with CTA at this time, is already anticoagulated on Xarelto.Very low suspicion for PE.  Duplex done for completeness and no DVT bilateral lower extremities   Abdomen distension/versus obesity: Had a bowel movement here. CT abdomen on repeat shows no evidence of bowel obstruction.  Tolerating diet.  Again had a bowel movement here   Insulin-dependent diabetes poorly controlled W/ hyperglycemia.  Hemoglobin A1c 7.5.  Blood sugar has been fluctuating .  Continue current insulin Recent Labs  Lab 05/30/20 1134 05/30/20 1619 05/30/20 1857 05/30/20 2026 05/31/20 0802  GLUCAP 221* 180* 219* 236* 202*   OSA on CPAP nightly  Chronic diastolic CHF: Compensated.  CAD history of PCI: He is on Xarelto and Crestor.  He does not have chest pain  AKI on CKD stage IIIa, baseline creatinine 1.2.  Renal function has improved. monitor . Recent Labs  Lab 05/25/20 2032 05/26/20 0728 05/27/20 0143  BUN 20 17 20   CREATININE 1.45* 1.17 1.18   Hyperbilirubinemia stable outpatient follow-up.  GERD On PPI  Hyperlipidemia-On crestor  Morbid obesity w/ BMI 34: He will benefit with weight loss and PCP follow-up   Consults:  Neurosurgery   Subjective: AA,no fever, not in dsstress Had a bowel movement.  He is resting comfortably on the bedside chair.  Appears pleasant.  He is ready for skilled  nursing rehab today.  Discharge Exam: Vitals:   05/30/20 2010 05/31/20 0600  BP: (!) 159/93 (!) 143/83  Pulse: 66 61  Resp: 16 16  Temp: 98.5 F (36.9 C) 99.7 F (37.6 C)  SpO2: 93% 95%   General: Pt is alert, awake, not in acute distress Cardiovascular: RRR, S1/S2 +, no rubs, no gallops Respiratory: CTA bilaterally, no wheezing, no rhonchi Abdominal: Soft, NT, ND, bowel sounds + Extremities: no edema, no cyanosis  Discharge Instructions  Discharge Instructions    Diet Carb Modified   Complete by: As directed    Discharge instructions   Complete by: As directed    Follow-up with the neurosurgery for your back pain management  Please call call MD or return to ER for similar or worsening recurring problem that brought you to hospital or if any fever,nausea/vomiting,abdominal pain, uncontrolled pain, chest pain,  shortness of breath or any other alarming symptoms.  Please follow-up your doctor as instructed in a week time and call the office for appointment.  Please avoid alcohol, smoking, or any other illicit substance and maintain healthy habits including taking your regular medications as prescribed.  You were cared for by a hospitalist during your hospital stay. If you have any questions about your discharge medications  or the care you received while you were in the hospital after you are discharged, you can call the unit and ask to speak with the hospitalist on call if the hospitalist that took care of you is not available.  Once you are discharged, your primary care physician will handle any further medical issues. Please note that NO REFILLS for any discharge medications will be authorized once you are discharged, as it is imperative that you return to your primary care physician (or establish a relationship with a primary care physician if you do not have one) for your aftercare needs so that they can reassess your need for medications and monitor your lab values   Increase  activity slowly   Complete by: As directed      Allergies as of 05/31/2020      Reactions   Iohexol    Phenergan [promethazine]    Other reaction(s): syncope   Tramadol       Medication List    TAKE these medications   acetaminophen 500 MG tablet Commonly known as: TYLENOL Take 500 mg by mouth every 6 (six) hours as needed for moderate pain or headache.   bisacodyl 10 MG suppository Commonly known as: DULCOLAX Place 1 suppository (10 mg total) rectally daily as needed for moderate constipation.   diclofenac Sodium 1 % Gel Commonly known as: VOLTAREN Apply 2-4 g topically 4 (four) times daily as needed (pain).   fluticasone 50 MCG/ACT nasal spray Commonly known as: FLONASE Place 1 spray into both nostrils daily as needed for allergies.   furosemide 20 MG tablet Commonly known as: LASIX Take 20-40 mg by mouth daily as needed for fluid.   lidocaine 5 % Commonly known as: LIDODERM Place 1 patch onto the skin daily. Remove & Discard patch within 12 hours or as directed by MD   losartan 100 MG tablet Commonly known as: COZAAR Take 100 mg by mouth daily.   methocarbamol 500 MG tablet Commonly known as: ROBAXIN Take 1 tablet (500 mg total) by mouth every 8 (eight) hours as needed for muscle spasms.   NovoLOG FlexPen 100 UNIT/ML FlexPen Generic drug: insulin aspart Inject 0-20 Units into the skin See admin instructions. Tid with meals per sliding scale   oxyCODONE 5 MG immediate release tablet Commonly known as: Oxy IR/ROXICODONE Take 1 tablet (5 mg total) by mouth every 6 (six) hours as needed for up to 6 doses for severe pain.   pantoprazole 40 MG tablet Commonly known as: PROTONIX Take 40 mg by mouth every morning.   polyethylene glycol 17 g packet Commonly known as: MIRALAX / GLYCOLAX Take 17 g by mouth daily.   pregabalin 300 MG capsule Commonly known as: LYRICA Take 300 mg by mouth 2 (two) times daily.   rosuvastatin 10 MG tablet Commonly known as:  CRESTOR Take 10 mg by mouth daily.   sitaGLIPtin 50 MG tablet Commonly known as: JANUVIA Take 50 mg by mouth daily.   Evaristo Bury FlexTouch 100 UNIT/ML FlexTouch Pen Generic drug: insulin degludec Inject 34 Units into the skin daily. What changed: how much to take   Xarelto 20 MG Tabs tablet Generic drug: rivaroxaban Take 20 mg by mouth daily.       Follow-up Information    Call  Daisy Floro, MD.   Specialty: Family Medicine Why: As needed Contact information: [address] [place] [address] [phone]        Bedelia Person, MD. Call in 1 week(s).  Specialty: Neurosurgery Contact information: [address] [place] [address] [phone]              Allergies  Allergen Reactions  . Iohexol   . Phenergan [Promethazine]     Other reaction(s): syncope  . Tramadol     The results of significant diagnostics from this hospitalization (including imaging, microbiology, ancillary and laboratory) are listed below for reference.    Microbiology: Recent Results (from the past 240 hour(s))  Resp Panel by RT-PCR (Flu A&B, Covid) Nasopharyngeal Swab     Status: None   Collection Time: 05/25/20  9:38 PM   Specimen: Nasopharyngeal Swab; Nasopharyngeal(NP) swabs in vial transport medium  Result Value Ref Range Status   SARS Coronavirus 2 by RT PCR NEGATIVE NEGATIVE Final    Comment: (NOTE) SARS-CoV-2 target nucleic acids are NOT DETECTED.  The SARS-CoV-2 RNA is generally detectable in upper respiratory specimens during the acute phase of infection. The lowest concentration of SARS-CoV-2 viral copies this assay can detect is 138 copies/mL. A negative result does not preclude SARS-Cov-2 infection and should not be used as the sole basis for treatment or other patient management decisions. A negative result may occur with  improper specimen collection/handling, submission of specimen other than nasopharyngeal swab, presence of viral  mutation(s) within the areas targeted by this assay, and inadequate number of viral copies(<138 copies/mL). A negative result must be combined with clinical observations, patient history, and epidemiological information. The expected result is Negative.  Fact Sheet for Patients:  EntrepreneurPulse.com.au  Fact Sheet for Healthcare Providers:  IncredibleEmployment.be  This test is no t yet approved or cleared by the [place] FDA and  has been authorized for detection and/or diagnosis of SARS-CoV-2 by FDA under an Emergency Use Authorization (EUA). This EUA will remain  in effect (meaning this test can be used) for the duration of the COVID-19 declaration under Section 564(b)(1) of the Act, 21 U.S.C.section 360bbb-3(b)(1), unless the authorization is terminated  or revoked sooner.       Influenza A by PCR NEGATIVE NEGATIVE Final   Influenza B by PCR NEGATIVE NEGATIVE Final    Comment: (NOTE) The Xpert Xpress SARS-CoV-2/FLU/RSV plus assay is intended as an aid in the diagnosis of influenza from Nasopharyngeal swab specimens and should not be used as a sole basis for treatment. Nasal washings and aspirates are unacceptable for Xpert Xpress SARS-CoV-2/FLU/RSV testing.  Fact Sheet for Patients: EntrepreneurPulse.com.au  Fact Sheet for Healthcare Providers: IncredibleEmployment.be  This test is not yet approved or cleared by the [place] FDA and has been authorized for detection and/or diagnosis of SARS-CoV-2 by FDA under an Emergency Use Authorization (EUA). This EUA will remain in effect (meaning this test can be used) for the duration of the COVID-19 declaration under Section 564(b)(1) of the Act, 21 U.S.C. section 360bbb-3(b)(1), unless the authorization is terminated or revoked.  Performed at [facility], [place] [address], [place], [place] [address]   Urine culture     Status: None    Collection Time: 05/25/20  9:56 PM   Specimen: Urine, Random  Result Value Ref Range Status   Specimen Description URINE, RANDOM  Final   Special Requests ADDED 2350  Final   Culture   Final    NO GROWTH Performed at [facility], [place] [address] [place], [place] [address]    Report Status 05/27/2020 FINAL  Final  SARS CORONAVIRUS 2 (TAT 6-24 HRS) Nasopharyngeal Nasopharyngeal Swab  Status: None   Collection Time: 05/30/20  3:07 PM   Specimen: Nasopharyngeal Swab  Result Value Ref Range Status   SARS Coronavirus 2 NEGATIVE NEGATIVE Final    Comment: (NOTE) SARS-CoV-2 target nucleic acids are NOT DETECTED.  The SARS-CoV-2 RNA is generally detectable in upper and lower respiratory specimens during the acute phase of infection. Negative results do not preclude SARS-CoV-2 infection, do not rule out co-infections with other pathogens, and should not be used as the sole basis for treatment or other patient management decisions. Negative results must be combined with clinical observations, patient history, and epidemiological information. The expected result is Negative.  Fact Sheet for Patients: SugarRoll.be  Fact Sheet for Healthcare Providers: https://www.woods-mathews.com/  This test is not yet approved or cleared by the [place] FDA and  has been authorized for detection and/or diagnosis of SARS-CoV-2 by FDA under an Emergency Use Authorization (EUA). This EUA will remain  in effect (meaning this test can be used) for the duration of the COVID-19 declaration under Se ction 564(b)(1) of the Act, 21 U.S.C. section 360bbb-3(b)(1), unless the authorization is terminated or revoked sooner.  Performed at [facility], [place] [address], [place], [place] [address]     Procedures/Studies: CT ABDOMEN PELVIS WO CONTRAST  Result Date: 05/26/2020 CLINICAL DATA:  Abdominal distension EXAM: CT ABDOMEN AND PELVIS WITHOUT CONTRAST  TECHNIQUE: Multidetector CT imaging of the abdomen and pelvis was performed following the standard protocol without IV contrast. COMPARISON:  CT renal colic 57/32/2025 FINDINGS: Lower chest: Mild cardiomegaly. Three-vessel coronary artery atherosclerosis. Distal thoracic aortic atherosclerosis. Small hiatal hernia and hyperdense contrast media present within the distal thoracic esophagus. Bibasilar atelectasis with additional bandlike areas of scarring or subsegmental atelectasis. No visible effusion. Hepatobiliary: No visible focal liver lesion within limitations of an unenhanced CT. Normal hepatic attenuation. Smooth surface contour. Prior cholecystectomy. No visible calcified gallstones or significant biliary ductal dilatation. Pancreas: Partial fatty replacement of the pancreas. No pancreatic ductal dilatation or surrounding inflammatory changes. Spleen: Normal in size. No concerning splenic lesions. Adrenals/Urinary Tract: Normal adrenal glands. Bilateral renal cortical atrophy. Increased bilateral perinephric stranding when compared to recent imaging performed 05/24/2020. Few fluid attenuation cysts are again noted. No concerning visible or contour deforming renal lesions. No urolithiasis or hydronephrosis. Left posterolateral bladder diverticulum is noted, some hazy stranding is noted adjacent this diverticulum, not as clearly evident on comparison. The indentation of bladder base by an enlarged prostate. Stomach/Bowel: Small hiatal hernia and high attenuation contrast media retained within the distal thoracic esophagus. Contrast media has traversed to the level of the transverse colon. No evidence of bowel obstruction. No conspicuous large or small thickening or dilatation. Contrast media opacifies a normal caliber noninflamed appendix in the right lower quadrant as well. Vascular/Lymphatic: Atherosclerotic calcifications within the abdominal aorta and branch vessels. No aneurysm or ectasia. No enlarged  abdominopelvic lymph nodes. Reproductive: The prostate and seminal vesicles are unremarkable. Multiple calcified phleboliths in the pelvis and along the spermatic cords. Other: No abdominopelvic free air or fluid no bowel containing hernia. Abdominal wall laxity is similar to prior. Musculoskeletal: Multilevel degenerative changes are present in the imaged portions of the spine. Additional degenerative changes in both hips and pelvis. Partial fusion across the SI joints. No concerning lytic or blastic lesions or other acute osseous abnormalities. IMPRESSION: 1. No evidence of high-grade bowel obstruction or other acute bowel abnormality. Abdominal wall laxity is not significantly changed from comparison. The 2. Increasing bilateral perinephric stranding as well as some  hazy stranding adjacent a previously identified bladder diverticula. Findings are concerning for possible ascending urinary tract infection. Background bilateral renal atrophy/scarring. 3. Indentation of the bladder base by an enlarged prostate could reflect some chronic outlet obstruction correlate with clinical symptoms. 4. Small hiatal hernia with high attenuation contrast media retained or refluxed into the distal thoracic esophagus, correlate for clinical symptoms. 5. Borderline cardiomegaly.  Coronary artery atherosclerosis. 6.  Aortic Atherosclerosis (ICD10-I70.0). 7. Mild body wall edema, correlate with fluid status. Electronically Signed   By: Lovena Le M.D.   On: 05/26/2020 21:59   DG Abd 1 View  Result Date: 05/26/2020 CLINICAL DATA:  80 year old male with abdominal distension. EXAM: ABDOMEN - 1 VIEW COMPARISON:  CT Abdomen and Pelvis 05/24/2020. FINDINGS: AP view at 0635 hours. Non obstructed bowel gas pattern. Stable cholecystectomy clips. Partially visible surgical clips about the prostate. Pelvic phleboliths. No acute osseous abnormality identified. IMPRESSION: Non obstructed bowel gas pattern. Electronically Signed   By: Genevie Ann  M.D.   On: 05/26/2020 07:04   CT Head Wo Contrast  Result Date: 05/25/2020 CLINICAL DATA:  Status post fall. EXAM: CT HEAD WITHOUT CONTRAST TECHNIQUE: Contiguous axial images were obtained from the base of the skull through the vertex without intravenous contrast. COMPARISON:  September 03, 2019 FINDINGS: Brain: There is mild cerebral atrophy with widening of the extra-axial spaces and ventricular dilatation. There are areas of decreased attenuation within the white matter tracts of the supratentorial brain, consistent with microvascular disease changes. Vascular: No hyperdense vessel or unexpected calcification. Skull: Normal. Negative for fracture or focal lesion. Sinuses/Orbits: There is stable marked severity posterior left ethmoid sinus and sphenoid sinus mucosal thickening. Other: A 2.8 cm x 0.6 cm left frontal scalp lipoma is noted (approximately -108.63 Hounsfield units). IMPRESSION: 1. Generalized cerebral atrophy. 2. No acute intracranial abnormality. 3. Chronic left ethmoid sinus and sphenoid sinus disease. Electronically Signed   By: Virgina Norfolk M.D.   On: 05/25/2020 21:26   CT Cervical Spine Wo Contrast  Result Date: 05/25/2020 CLINICAL DATA:  Status post fall. EXAM: CT CERVICAL SPINE WITHOUT CONTRAST TECHNIQUE: Multidetector CT imaging of the cervical spine was performed without intravenous contrast. Multiplanar CT image reconstructions were also generated. COMPARISON:  September 03, 2019 FINDINGS: Alignment: Normal. Skull base and vertebrae: No acute fracture. A radiopaque fixation plate and fixation screws are seen along the anterior aspect of the C3, C4, C5, C6 and C7 levels. Arthrodesis is also seen throughout the fused levels. These areas are unchanged in appearance and position. Associated streak artifact is noted with subsequently limited evaluation of the adjacent osseous and soft tissue structures. Soft tissues and spinal canal: No prevertebral fluid or swelling. No visible canal  hematoma. Disc levels: Stable moderate to marked severity endplate sclerosis and associated intervertebral disc space narrowing is noted at the levels of C6-C7 and C7-T1. Bilateral moderate severity multilevel facet joint hypertrophy is noted. Upper chest: Negative. Other: None. IMPRESSION: 1. Stable extensive postoperative changes consistent with C3 through C7 fusion with corpectomy. 2. No acute cervical spine fracture. 3. Stable degenerative changes at the levels of C6-C7 and C7-T1. Electronically Signed   By: Virgina Norfolk M.D.   On: 05/25/2020 21:31   CT Thoracic Spine Wo Contrast  Result Date: 05/25/2020 CLINICAL DATA:  Fall EXAM: CT THORACIC AND LUMBAR SPINE WITHOUT CONTRAST TECHNIQUE: Multidetector CT imaging of the thoracic and lumbar spine was performed without contrast. Multiplanar CT image reconstructions were also generated. COMPARISON:  Lumbar spine CT 09/03/2019 FINDINGS:  CT THORACIC SPINE FINDINGS Alignment: Normal. Vertebrae: No acute fracture or focal pathologic process. Paraspinal and other soft tissues: Calcific aortic atherosclerosis. Disc levels: No bony spinal canal stenosis. CT LUMBAR SPINE FINDINGS Segmentation: 5 lumbar type vertebrae. Alignment: Normal. Vertebrae: No acute fracture or focal pathologic process. Chronic height loss at L4 is unchanged. Paraspinal and other soft tissues: Negative Disc levels: Mild spinal canal stenosis at L4-5. IMPRESSION: 1. No acute fracture or static subluxation of the thoracic or lumbar spine. 2. Chronic height loss at L4, unchanged. 3. Mild spinal canal stenosis at L4-5. Aortic Atherosclerosis (ICD10-I70.0). Electronically Signed   By: Ulyses Jarred M.D.   On: 05/25/2020 21:51   CT Lumbar Spine Wo Contrast  Result Date: 05/25/2020 CLINICAL DATA:  Fall EXAM: CT THORACIC AND LUMBAR SPINE WITHOUT CONTRAST TECHNIQUE: Multidetector CT imaging of the thoracic and lumbar spine was performed without contrast. Multiplanar CT image reconstructions were  also generated. COMPARISON:  Lumbar spine CT 09/03/2019 FINDINGS: CT THORACIC SPINE FINDINGS Alignment: Normal. Vertebrae: No acute fracture or focal pathologic process. Paraspinal and other soft tissues: Calcific aortic atherosclerosis. Disc levels: No bony spinal canal stenosis. CT LUMBAR SPINE FINDINGS Segmentation: 5 lumbar type vertebrae. Alignment: Normal. Vertebrae: No acute fracture or focal pathologic process. Chronic height loss at L4 is unchanged. Paraspinal and other soft tissues: Negative Disc levels: Mild spinal canal stenosis at L4-5. IMPRESSION: 1. No acute fracture or static subluxation of the thoracic or lumbar spine. 2. Chronic height loss at L4, unchanged. 3. Mild spinal canal stenosis at L4-5. Aortic Atherosclerosis (ICD10-I70.0). Electronically Signed   By: Ulyses Jarred M.D.   On: 05/25/2020 21:51   CT PELVIS WO CONTRAST  Result Date: 05/25/2020 CLINICAL DATA:  Findings suspicious for pubic symphysis fracture on the left on recent plain film EXAM: CT PELVIS WITHOUT CONTRAST TECHNIQUE: Multidetector CT imaging of the pelvis was performed following the standard protocol without intravenous contrast. COMPARISON:  Plain film from earlier in the same day. FINDINGS: Urinary Tract: Bladder is well distended. 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 Bowel: Visualized bowel is unremarkable. The appendix is within normal limits. Vascular/Lymphatic: Vascular calcifications are seen. No significant lymphadenopathy is noted. Multiple phleboliths are noted within the scrotum. Reproductive:  Prostate is within normal limits. Other:  None. Musculoskeletal: Bony structures show no acute fracture. The areas of abnormality in the pubic symphysis were artifactual related to summation of shadows. Degenerative changes of lumbar spine and hip joints are seen. IMPRESSION: No acute fracture noted. 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 No other focal abnormality is seen. Electronically Signed    By: Inez Catalina M.D.   On: 05/25/2020 22:38   DG Chest Portable 1 View  Result Date: 05/25/2020 CLINICAL DATA:  Recent fall with chest pain, initial encounter EXAM: PORTABLE CHEST 1 VIEW COMPARISON:  09/03/2019 FINDINGS: Cardiac shadow is enlarged but accentuated by the frontal technique. Aortic calcifications are again noted. Lungs are hypoaerated bilaterally without focal infiltrate or sizable effusion. No acute bony abnormality is noted. IMPRESSION: Poor inspiratory effort.  No acute abnormality noted. Electronically Signed   By: Inez Catalina M.D.   On: 05/25/2020 20:53   EEG adult  Result Date: 05/26/2020 Lora Havens, MD     05/26/2020 11:47 AM Patient Name: Steve Brown MRN: [id number] Epilepsy Attending: Lora Havens Referring Physician/Provider: Dr Derrick Ravel Date: 05/26/2020 Duration: 25 mins Patient history: 80yo M with ams, syncope. EEG to evaluate for seizure. Level of alertness: Awake, asleep AEDs during EEG study:  [employer] Technical aspects: This EEG study was done with scalp electrodes positioned according to the 10-20 International system of electrode placement. Electrical activity was acquired at a sampling rate of 500Hz  and reviewed with a high frequency filter of 70Hz  and a low frequency filter of 1Hz . EEG data were recorded continuously and digitally stored. Description: The posterior dominant rhythm consists of 8-9 Hz activity of moderate voltage (25-35 uV) seen predominantly in posterior head regions, symmetric and reactive to eye opening and eye closing. Sleep was characterized by vertex waves, sleep spindles (12 to 14 Hz), maximal frontocentral region. Hyperventilation and photic stimulation were not performed.   IMPRESSION: This study is within normal limits. No seizures or epileptiform discharges were seen throughout the recording. Lora Havens   ECHOCARDIOGRAM COMPLETE  Result Date: 05/26/2020    ECHOCARDIOGRAM REPORT   Patient Name:   Steve Brown  Date of Exam: 05/26/2020 Medical Rec #:  [id number]        Height:       65.0 in Accession #:    [id number]       Weight:       215.0 lb Date of Birth:  Jun 07, 1940         BSA:          2.040 m Patient Age:    80 years         BP:           151/80 mmHg Patient Gender: M                HR:           66 bpm. Exam Location:  Inpatient Procedure: 2D Echo, Cardiac Doppler and Color Doppler Indications:    Syncope  History:        Patient has no prior history of Echocardiogram examinations.                 CAD, Stroke and COPD, Signs/Symptoms:Syncope; Risk                 Factors:Diabetes, Hypertension, Dyslipidemia and Sleep Apnea.                 CKD, pul.embolus.  Sonographer:    Dustin Flock Referring Phys: [id number] [place]  1. Left ventricular ejection fraction, by estimation, is 60 to 65%. The left ventricle has normal function. The left ventricle has no regional wall motion abnormalities. Left ventricular diastolic parameters are consistent with Grade I diastolic dysfunction (impaired relaxation).  2. Right ventricular systolic function is normal. The right ventricular size is normal. There is mildly elevated pulmonary artery systolic pressure.  3. Left atrial size was mildly dilated.  4. Right atrial size was mildly dilated.  5. The mitral valve is normal in structure. No evidence of mitral valve regurgitation. No evidence of mitral stenosis.  6. The aortic valve is normal in structure. There is mild calcification of the aortic valve. Aortic valve regurgitation is trivial. No aortic stenosis is present.  7. The inferior vena cava is normal in size with greater than 50% respiratory variability, suggesting right atrial pressure of 3 mmHg. FINDINGS  Left Ventricle: Left ventricular ejection fraction, by estimation, is 60 to 65%. The left ventricle has normal function. The left ventricle has no regional wall motion abnormalities. The left ventricular internal cavity size was normal in size.  There is  no left ventricular hypertrophy. Left ventricular diastolic parameters are consistent with Grade I diastolic dysfunction (impaired relaxation). Right Ventricle:  The right ventricular size is normal. No increase in right ventricular wall thickness. Right ventricular systolic function is normal. There is mildly elevated pulmonary artery systolic pressure. The tricuspid regurgitant velocity is 2.93  m/s, and with an assumed right atrial pressure of 3 mmHg, the estimated right ventricular systolic pressure is 123XX123 mmHg. Left Atrium: Left atrial size was mildly dilated. Right Atrium: Right atrial size was mildly dilated. Pericardium: There is no evidence of pericardial effusion. Mitral Valve: The mitral valve is normal in structure. No evidence of mitral valve regurgitation. No evidence of mitral valve stenosis. Tricuspid Valve: The tricuspid valve is normal in structure. Tricuspid valve regurgitation is trivial. No evidence of tricuspid stenosis. Aortic Valve: The aortic valve is normal in structure. There is mild calcification of the aortic valve. Aortic valve regurgitation is trivial. No aortic stenosis is present. Pulmonic Valve: The pulmonic valve was normal in structure. Pulmonic valve regurgitation is trivial. No evidence of pulmonic stenosis. Aorta: The aortic root is normal in size and structure. Venous: The inferior vena cava is normal in size with greater than 50% respiratory variability, suggesting right atrial pressure of 3 mmHg. IAS/Shunts: No atrial level shunt detected by color flow Doppler.  LEFT VENTRICLE PLAX 2D LVIDd:         4.10 cm  Diastology LVIDs:         2.80 cm  LV e' medial:    4.57 cm/s LV PW:         1.50 cm  LV E/e' medial:  11.2 LV IVS:        1.60 cm  LV e' lateral:   4.24 cm/s LVOT diam:     2.20 cm  LV E/e' lateral: 12.0 LV SV:         62 LV SV Index:   31 LVOT Area:     3.80 cm  RIGHT VENTRICLE RV Basal diam:  3.20 cm RV S prime:     5.98 cm/s TAPSE (M-mode): 2.2 cm LEFT  ATRIUM             Index       RIGHT ATRIUM           Index LA diam:        3.90 cm 1.91 cm/m  RA Area:     20.00 cm LA Vol (A2C):   59.7 ml 29.27 ml/m RA Volume:   53.80 ml  26.37 ml/m LA Vol (A4C):   30.9 ml 15.15 ml/m LA Biplane Vol: 47.5 ml 23.29 ml/m  AORTIC VALVE LVOT Vmax:   80.90 cm/s LVOT Vmean:  54.100 cm/s LVOT VTI:    0.164 m  AORTA Ao Root diam: 3.20 cm MITRAL VALVE               TRICUSPID VALVE MV Area (PHT): 3.12 cm    TR Peak grad:   34.3 mmHg MV Decel Time: 243 msec    TR Vmax:        293.00 cm/s MV E velocity: 51.00 cm/s MV A velocity: 75.40 cm/s  SHUNTS MV E/A ratio:  0.68        Systemic VTI:  0.16 m                            Systemic Diam: 2.20 cm Glori Bickers MD Electronically signed by Glori Bickers MD Signature Date/Time: 05/26/2020/2:40:51 PM    Final    DG Hip Port Unilat W or Wo Pelvis  1 View Left  Result Date: 05/25/2020 CLINICAL DATA:  Recent fall with hip pain, initial encounter EXAM: DG HIP (WITH OR WITHOUT PELVIS) 3V PORT LEFT COMPARISON:  None. FINDINGS: There is lucency identified in the pubic symphysis on the left suspicious for undisplaced fracture. Proximal femur appears within normal limits. No soft tissue abnormality is noted. IMPRESSION: Lucency within the pubic symphysis on the left suspicious for undisplaced fracture. Electronically Signed   By: Inez Catalina M.D.   On: 05/25/2020 20:58   VAS US CAROTID  Result Date: 05/26/2020 Carotid Arterial Duplex Study Patient Name:  Steve Brown  Date of Exam:   05/26/2020 Medical Rec #: [id number]         Accession #:    [id number] Date of Birth: April 14, 1940          Patient Gender: M Patient Age:   80Y Exam Location:  [facility] Procedure:      VAS US CAROTID Referring Phys: [id number] VASUNDHRA RATHORE --------------------------------------------------------------------------------  Indications:      Syncope. Risk Factors:     Hypertension, hyperlipidemia, Diabetes, coronary artery                    disease. Comparison Study: No prior studies. Performing Technologist: Darlin Coco RDMS,RVT  Examination Guidelines: A complete evaluation includes B-mode imaging, spectral Doppler, color Doppler, and power Doppler as needed of all accessible portions of each vessel. Bilateral testing is considered an integral part of a complete examination. Limited examinations for reoccurring indications may be performed as noted.  Right Carotid Findings: +----------+--------+--------+--------+------------------+--------+           PSV cm/sEDV cm/sStenosisPlaque DescriptionComments +----------+--------+--------+--------+------------------+--------+ CCA Prox  74      12                                         +----------+--------+--------+--------+------------------+--------+ CCA Distal85      17              calcific                   +----------+--------+--------+--------+------------------+--------+ ICA Prox  90      18      1-39%   calcific                   +----------+--------+--------+--------+------------------+--------+ ICA Distal83      24                                         +----------+--------+--------+--------+------------------+--------+ ECA       98      13                                         +----------+--------+--------+--------+------------------+--------+ +----------+--------+-------+----------------+-------------------+           PSV cm/sEDV cmsDescribe        Arm Pressure (mmHG) +----------+--------+-------+----------------+-------------------+ [id number]            Multiphasic, WNL                    +----------+--------+-------+----------------+-------------------+ +---------+--------+--+--------+--+---------+ VertebralPSV cm/s71EDV cm/s16Antegrade +---------+--------+--+--------+--+---------+  Left Carotid Findings: +----------+--------+--------+--------+----------------------------+--------+           PSV cm/sEDV  cm/sStenosisPlaque  Description          Comments +----------+--------+--------+--------+----------------------------+--------+ CCA Prox  99      16                                                   +----------+--------+--------+--------+----------------------------+--------+ CCA Mid   153     22      <50%    hyperechoic and heterogenous         +----------+--------+--------+--------+----------------------------+--------+ CCA Distal80      15                                                   +----------+--------+--------+--------+----------------------------+--------+ ICA Prox  79      11      1-39%   calcific                             +----------+--------+--------+--------+----------------------------+--------+ ICA Distal93      27                                                   +----------+--------+--------+--------+----------------------------+--------+ ECA       68                                                           +----------+--------+--------+--------+----------------------------+--------+ +----------+--------+--------+----------------+-------------------+           PSV cm/sEDV cm/sDescribe        Arm Pressure (mmHG) +----------+--------+--------+----------------+-------------------+ Subclavian162             Multiphasic, WNL                    +----------+--------+--------+----------------+-------------------+ +---------+--------+--+--------+--+---------+ VertebralPSV cm/s45EDV cm/s23Antegrade +---------+--------+--+--------+--+---------+   Summary: Right Carotid: Velocities in the right ICA are consistent with a 1-39% stenosis. Left Carotid: Velocities in the left ICA are consistent with a 1-39% stenosis.               Non-hemodynamically significant plaque <50% noted in the CCA. Vertebrals:  Bilateral vertebral arteries demonstrate antegrade flow. Subclavians: Normal flow hemodynamics were seen in bilateral subclavian              arteries. *See table(s)  above for measurements and observations.  Electronically signed by Antony Contras MD on 05/26/2020 at 4:29:08 PM.    Final    VAS [place] LOWER EXTREMITY VENOUS (DVT)  Result Date: 05/28/2020  Lower Venous DVT Study Patient Name:  DUSTAN REISMAN  Date of Exam:   05/28/2020 Medical Rec #: [id number]         Accession #:    [id number] Date of Birth: 1940/07/17          Patient Gender: M Patient Age:   80Y Exam Location:  [facility] Procedure:      VAS [place] LOWER EXTREMITY VENOUS (DVT) Referring Phys: [id number]  [place] --------------------------------------------------------------------------------  Indications: Swelling.  Comparison Study: No previous exams Performing Technologist: Rogelia Rohrer  Examination Guidelines: A complete evaluation includes B-mode imaging, spectral Doppler, color Doppler, and power Doppler as needed of all accessible portions of each vessel. Bilateral testing is considered an integral part of a complete examination. Limited examinations for reoccurring indications may be performed as noted. The reflux portion of the exam is performed with the patient in reverse Trendelenburg.  +---------+---------------+---------+-----------+----------+--------------+ RIGHT    CompressibilityPhasicitySpontaneityPropertiesThrombus Aging +---------+---------------+---------+-----------+----------+--------------+ CFV      Full           Yes      Yes                                 +---------+---------------+---------+-----------+----------+--------------+ SFJ      Full                                                        +---------+---------------+---------+-----------+----------+--------------+ FV Prox  Full           Yes      Yes                                 +---------+---------------+---------+-----------+----------+--------------+ FV Mid   Full           Yes      Yes                                  +---------+---------------+---------+-----------+----------+--------------+ FV DistalFull           Yes      Yes                                 +---------+---------------+---------+-----------+----------+--------------+ PFV      Full                                                        +---------+---------------+---------+-----------+----------+--------------+ POP      Full           Yes      Yes                                 +---------+---------------+---------+-----------+----------+--------------+ PTV      Full                                                        +---------+---------------+---------+-----------+----------+--------------+ PERO     Full                                                        +---------+---------------+---------+-----------+----------+--------------+   +---------+---------------+---------+-----------+----------+--------------+  LEFT     CompressibilityPhasicitySpontaneityPropertiesThrombus Aging +---------+---------------+---------+-----------+----------+--------------+ CFV      Full           Yes      Yes                                 +---------+---------------+---------+-----------+----------+--------------+ SFJ      Full                                                        +---------+---------------+---------+-----------+----------+--------------+ FV Prox  Full           Yes      Yes                                 +---------+---------------+---------+-----------+----------+--------------+ FV Mid   Full           Yes      Yes                                 +---------+---------------+---------+-----------+----------+--------------+ FV DistalFull           Yes      Yes                                 +---------+---------------+---------+-----------+----------+--------------+ PFV      Full                                                         +---------+---------------+---------+-----------+----------+--------------+ POP      Full           Yes      Yes                                 +---------+---------------+---------+-----------+----------+--------------+ PTV      Full                                                        +---------+---------------+---------+-----------+----------+--------------+ PERO     Full                                                        +---------+---------------+---------+-----------+----------+--------------+     Summary: BILATERAL: - No evidence of deep vein thrombosis seen in the lower extremities, bilaterally. - No evidence of superficial venous thrombosis in the lower extremities, bilaterally. -No evidence of popliteal cyst, bilaterally.   *See table(s) above for measurements and observations. Electronically signed by Deitra Mayo MD on 05/28/2020 at 8:13:57 PM.    Final  Labs: BNP (last 3 results) No results for input(s): BNP in the last 8760 hours. Basic Metabolic Panel: Recent Labs  Lab 05/25/20 2032 05/26/20 0728 05/27/20 0143  NA 134* 136 133*  K 4.3 4.2 4.0  CL 100 104 101  CO2 25 22 24   GLUCOSE 358* 251* 168*  BUN 20 17 20   CREATININE 1.45* 1.17 1.18  CALCIUM 9.2 8.6* 8.5*   Liver Function Tests: Recent Labs  Lab 05/25/20 2032 05/26/20 0728 05/27/20 0143  AST 25  --  23  22  ALT 25  --  19  19  ALKPHOS 65  --  48  47  BILITOT 1.7* 1.1 1.0  0.8  PROT 7.5  --  6.5  6.2*  ALBUMIN 3.7  --  2.9*  2.9*   Recent Labs  Lab 05/27/20 0143  LIPASE 37   Recent Labs  Lab 05/26/20 0728  AMMONIA 29   CBC: Recent Labs  Lab 05/25/20 2032 05/27/20 0143 05/28/20 0325 05/29/20 0156  WBC 8.8 8.3 5.9 5.8  NEUTROABS 6.6  --   --   --   HGB 13.9 12.7* 12.0* 12.0*  HCT 41.4 37.1* 35.3* 35.5*  MCV 93.9 92.5 91.9 91.7  PLT 162 160 169 168   Cardiac Enzymes: Recent Labs  Lab 05/25/20 2230  CKTOTAL 406*   BNP: Invalid input(s):  POCBNP CBG: Recent Labs  Lab 05/30/20 1134 05/30/20 1619 05/30/20 1857 05/30/20 2026 05/31/20 0802  GLUCAP 221* 180* 219* 236* 202*   D-Dimer No results for input(s): DDIMER in the last 72 hours. Hgb A1c No results for input(s): HGBA1C in the last 72 hours. Lipid Profile No results for input(s): CHOL, HDL, LDLCALC, TRIG, CHOLHDL, LDLDIRECT in the last 72 hours. Thyroid function studies No results for input(s): TSH, T4TOTAL, T3FREE, THYROIDAB in the last 72 hours.  Invalid input(s): FREET3 Anemia work up No results for input(s): VITAMINB12, FOLATE, FERRITIN, TIBC, IRON, RETICCTPCT in the last 72 hours. Urinalysis    Component Value Date/Time   COLORURINE YELLOW 05/25/2020 2203   APPEARANCEUR CLOUDY (A) 05/25/2020 2203   LABSPEC 1.016 05/25/2020 2203   PHURINE 5.0 05/25/2020 2203   GLUCOSEU >=500 (A) 05/25/2020 2203   HGBUR SMALL (A) 05/25/2020 2203   BILIRUBINUR NEGATIVE 05/25/2020 2203   KETONESUR 20 (A) 05/25/2020 2203   PROTEINUR 30 (A) 05/25/2020 2203   NITRITE NEGATIVE 05/25/2020 2203   LEUKOCYTESUR LARGE (A) 05/25/2020 2203   Sepsis Labs Invalid input(s): PROCALCITONIN,  WBC,  LACTICIDVEN Microbiology Recent Results (from the past 240 hour(s))  Resp Panel by RT-PCR (Flu A&B, Covid) Nasopharyngeal Swab     Status: None   Collection Time: 05/25/20  9:38 PM   Specimen: Nasopharyngeal Swab; Nasopharyngeal(NP) swabs in vial transport medium  Result Value Ref Range Status   SARS Coronavirus 2 by RT PCR NEGATIVE NEGATIVE Final    Comment: (NOTE) SARS-CoV-2 target nucleic acids are NOT DETECTED.  The SARS-CoV-2 RNA is generally detectable in upper respiratory specimens during the acute phase of infection. The lowest concentration of SARS-CoV-2 viral copies this assay can detect is 138 copies/mL. A negative result does not preclude SARS-Cov-2 infection and should not be used as the sole basis for treatment or other patient management decisions. A negative result may  occur with  improper specimen collection/handling, submission of specimen other than nasopharyngeal swab, presence of viral mutation(s) within the areas targeted by this assay, and inadequate number of viral copies(<138 copies/mL). A negative result must be combined with clinical observations, patient history,  and epidemiological information. The expected result is Negative.  Fact Sheet for Patients:  EntrepreneurPulse.com.au  Fact Sheet for Healthcare Providers:  IncredibleEmployment.be  This test is no t yet approved or cleared by the [place] FDA and  has been authorized for detection and/or diagnosis of SARS-CoV-2 by FDA under an Emergency Use Authorization (EUA). This EUA will remain  in effect (meaning this test can be used) for the duration of the COVID-19 declaration under Section 564(b)(1) of the Act, 21 U.S.C.section 360bbb-3(b)(1), unless the authorization is terminated  or revoked sooner.       Influenza A by PCR NEGATIVE NEGATIVE Final   Influenza B by PCR NEGATIVE NEGATIVE Final    Comment: (NOTE) The Xpert Xpress SARS-CoV-2/FLU/RSV plus assay is intended as an aid in the diagnosis of influenza from Nasopharyngeal swab specimens and should not be used as a sole basis for treatment. Nasal washings and aspirates are unacceptable for Xpert Xpress SARS-CoV-2/FLU/RSV testing.  Fact Sheet for Patients: EntrepreneurPulse.com.au  Fact Sheet for Healthcare Providers: IncredibleEmployment.be  This test is not yet approved or cleared by the [place] FDA and has been authorized for detection and/or diagnosis of SARS-CoV-2 by FDA under an Emergency Use Authorization (EUA). This EUA will remain in effect (meaning this test can be used) for the duration of the COVID-19 declaration under Section 564(b)(1) of the Act, 21 U.S.C. section 360bbb-3(b)(1), unless the authorization is terminated  or revoked.  Performed at [facility], [place] [address], [place], [place] [address]   Urine culture     Status: None   Collection Time: 05/25/20  9:56 PM   Specimen: Urine, Random  Result Value Ref Range Status   Specimen Description URINE, RANDOM  Final   Special Requests ADDED 2350  Final   Culture   Final    NO GROWTH Performed at [facility], [place] [address], [place], [place] [address]    Report Status 05/27/2020 FINAL  Final  SARS CORONAVIRUS 2 (TAT 6-24 HRS) Nasopharyngeal Nasopharyngeal Swab     Status: None   Collection Time: 05/30/20  3:07 PM   Specimen: Nasopharyngeal Swab  Result Value Ref Range Status   SARS Coronavirus 2 NEGATIVE NEGATIVE Final    Comment: (NOTE) SARS-CoV-2 target nucleic acids are NOT DETECTED.  The SARS-CoV-2 RNA is generally detectable in upper and lower respiratory specimens during the acute phase of infection. Negative results do not preclude SARS-CoV-2 infection, do not rule out co-infections with other pathogens, and should not be used as the sole basis for treatment or other patient management decisions. Negative results must be combined with clinical observations, patient history, and epidemiological information. The expected result is Negative.  Fact Sheet for Patients: SugarRoll.be  Fact Sheet for Healthcare Providers: https://www.woods-mathews.com/  This test is not yet approved or cleared by the [place] FDA and  has been authorized for detection and/or diagnosis of SARS-CoV-2 by FDA under an Emergency Use Authorization (EUA). This EUA will remain  in effect (meaning this test can be used) for the duration of the COVID-19 declaration under Se ction 564(b)(1) of the Act, 21 U.S.C. section 360bbb-3(b)(1), unless the authorization is terminated or revoked sooner.  Performed at [facility], [place] [address] [place], [place] [address]      Time coordinating  discharge: 25 minutes  SIGNED: Antonieta Pert, MD  [facility] 05/31/2020, 9:34 AM  If 7PM-7AM, please contact night-coverage [email or web address]

## 2020-05-31 NOTE — TOC Transition Note (Signed)
Transition of Care [facility]) - CM/SW Discharge Note   Patient Details  Name: Steve Brown MRN: [id number] Date of Birth: 1940/06/25  Transition of Care [facility]) CM/SW Contact:  Trula Ore, [place] Phone Number: 05/31/2020, 11:47 AM   Clinical Narrative:     Patient will DC to: [facility]  Anticipated DC date: 05/31/2020  Family notified: [name]   Transport by: Corey Harold  ?  Per MD patient ready for DC to [facility] . RN, patient, patient's family, and facility notified of DC. Patients daughter Jonelle Sidle will bring Cpap from home for patient at SNF. Facility updated. Discharge Summary sent to facility. RN given number for report tele# [phone] ask for 100 hall nurse RM# [phone]. DC packet on chart. Ambulance transport requested for patient.  CSW signing off.    Final next level of care: Skilled Nursing Facility Barriers to Discharge: No Barriers Identified   Patient Goals and CMS Choice Patient states their goals for this hospitalization and ongoing recovery are:: to go to SNF CMS Medicare.gov Compare Post Acute Care list provided to:: Patient Choice offered to / list presented to : Patient  Discharge Placement              Patient chooses bed at: [facility] Patient to be transferred to facility by: [place] Name of family member notified: [name] Patient and family notified of of transfer: 05/31/20  Discharge Plan and Services In-house Referral: Clinical Social Work                                   Social Determinants of Health ([place]) Interventions     Readmission Risk Interventions No flowsheet data found.

## 2020-05-31 NOTE — Care Management Important Message (Signed)
Important Message  Patient Details  Name: Steve Brown MRN: [id number] Date of Birth: [birth year]   Medicare Important Message Given:  Yes     Shelda Altes 05/31/2020, 10:24 AM

## 2020-05-31 NOTE — Progress Notes (Signed)
Patient able to walk safely with a walker and transfer to a WC so his daughter came to pick him up and deliver him to [place] via private vehicle. Discharge instructions and scripts given to patient and patient taken to her car via [place].

## 2020-05-31 NOTE — Progress Notes (Signed)
Report called to Truett Perna at [facility]. Awaiting PTAR to transport. Patient with no complaints at this time.

## 2020-05-31 NOTE — Progress Notes (Signed)
Occupational Therapy Treatment Patient Details Name: Steve Brown MRN: [id number] DOB: [birth year] Today's Date: 05/31/2020    History of present illness Pt is a 80 y.o. male admitted 05/25/20 after unwitnessed fall at home, c/o back and LLE pain, encephalopathy; of note, recently seen in ED 2 days prior with UTI. Imaging negative for acute injury. EEG 5/18 within normal limits. Abdominal CT negative for bowel obstruction; small hiatal hernia, concern for ascending UTI. PMH includes CHF, CAD s/p PCI, CKD 3, DM, obesity, OSA on CPAP.   OT comments  Pt making steady progress towards OT goals this session. Pt received seated in straight back chair dressed and agreeable to OT intervention. Pt reports that he got himself dressed and had no issues with UB/LB ADLS. Pt able to complete functional mobility greater than a household distance with RW and MIN guard - supervision assist. Pt on RA during session with SpO2 100-97%. Pt with some mild safety concerns noted during session but overall WFL. Pt would continue to benefit from skilled occupational therapy while admitted and after d/c to address the below listed limitations in order to improve overall functional mobility and facilitate independence with BADL participation. DC plan remains appropriate, will follow acutely per POC.     Follow Up Recommendations  SNF;Supervision/Assistance - 24 hour    Equipment Recommendations  3 in 1 bedside commode    Recommendations for Other Services      Precautions / Restrictions Precautions Precautions: Fall Restrictions Weight Bearing Restrictions: No       Mobility Bed Mobility               General bed mobility comments: pt OOB in straight back chair upon arrival and returned to recliner at end of session    Transfers Overall transfer level: Needs assistance Equipment used: Rolling walker (2 wheeled) Transfers: Sit to/from Stand Sit to Stand: Min assist;Min guard         General  transfer comment: min guard to rise from chair but MIN A to descend into sitting surface as pt attempting to sit prematurely into recliner    Balance Overall balance assessment: Needs assistance Sitting-balance support: Feet supported;No upper extremity supported Sitting balance-Leahy Scale: Fair     Standing balance support: During functional activity;No upper extremity supported Standing balance-Leahy Scale: Fair Standing balance comment: standing to don mask with no UE support                           ADL either performed or assessed with clinical judgement   ADL Overall ADL's : Needs assistance/impaired                         Toilet Transfer: Supervision/safety;Min guard;RW;Ambulation Armed forces technical officer Details (indicate cue type and reason): simulated via functional mobility with pt completing functional mobility greater than a household distance         Functional mobility during ADLs: Min guard;Supervision/safety;Rolling walker General ADL Comments: pt completing functional mobility greater than a household distance with RW and min guard - supervision assist, pt reports he got himself dressed and was sitting in straight back chair upon OTA arrival     Vision       Perception     Praxis      Cognition Arousal/Alertness: Awake/alert Behavior During Therapy: WFL for tasks assessed/performed Overall Cognitive Status: No family/caregiver present to determine baseline cognitive functioning Area of Impairment: Safety/judgement  Safety/Judgement: Decreased awareness of safety     General Comments: increased cues for safety as pt preferring to keep RW out of BOS and needs cues to reach back to chair as pt attempting to sit prematurely, overall WFL for simple mobility tasks        Exercises     Shoulder Instructions       General Comments pt on RA during session with SpO2 100- 97% post ambulation    Pertinent  Vitals/ Pain       Pain Assessment: Faces Faces Pain Scale: Hurts little more Pain Location: low back, BLEs Pain Descriptors / Indicators: Discomfort;Grimacing;Guarding Pain Intervention(s): Limited activity within patient's tolerance;Monitored during session;Repositioned  Home Living                                          Prior Functioning/Environment              Frequency  Min 2X/week        Progress Toward Goals  OT Goals(current goals can now be found in the care plan section)  Progress towards OT goals: Progressing toward goals  Acute Rehab OT Goals Patient Stated Goal: to go to rehab OT Goal Formulation: With patient Time For Goal Achievement: 06/10/20 Potential to Achieve Goals: Good  Plan Discharge plan remains appropriate;Frequency remains appropriate    Co-evaluation                 AM-PAC OT "6 Clicks" Daily Activity     Outcome Measure   Help from another person eating meals?: None Help from another person taking care of personal grooming?: None Help from another person toileting, which includes using toliet, bedpan, or urinal?: A Little Help from another person bathing (including washing, rinsing, drying)?: A Little Help from another person to put on and taking off regular upper body clothing?: None Help from another person to put on and taking off regular lower body clothing?: A Little 6 Click Score: 21    End of Session Equipment Utilized During Treatment: Rolling walker  OT Visit Diagnosis: Unsteadiness on feet (R26.81);Muscle weakness (generalized) (M62.81);History of falling (Z91.81)   Activity Tolerance Patient tolerated treatment well   Patient Left in chair;with call bell/phone within reach   Nurse Communication Mobility status;Other (comment) (assisted pt with ordering dinner)        Time: 9678-9381 OT Time Calculation (min): 25 min  Charges: OT General Charges $OT Visit: 1 Visit OT  Treatments $Therapeutic Activity: 23-37 mins  Harley Alto., COTA/L Acute Rehabilitation Services [phone]    Precious Haws 05/31/2020, 3:36 PM

## 2020-06-01 ENCOUNTER — Ambulatory Visit: Payer: Medicare Other | Admitting: Adult Health

## 2020-06-01 ENCOUNTER — Encounter: Payer: Self-pay | Admitting: Internal Medicine

## 2020-06-01 ENCOUNTER — Encounter: Payer: Self-pay | Admitting: Adult Health

## 2020-06-01 ENCOUNTER — Encounter (HOSPITAL_BASED_OUTPATIENT_CLINIC_OR_DEPARTMENT_OTHER): Payer: Self-pay

## 2020-06-01 ENCOUNTER — Non-Acute Institutional Stay (SKILLED_NURSING_FACILITY): Payer: Medicare Other | Admitting: Internal Medicine

## 2020-06-01 DIAGNOSIS — N390 Urinary tract infection, site not specified: Secondary | ICD-10-CM | POA: Diagnosis not present

## 2020-06-01 DIAGNOSIS — I2510 Atherosclerotic heart disease of native coronary artery without angina pectoris: Secondary | ICD-10-CM | POA: Diagnosis not present

## 2020-06-01 DIAGNOSIS — M5442 Lumbago with sciatica, left side: Secondary | ICD-10-CM | POA: Diagnosis not present

## 2020-06-01 DIAGNOSIS — G9341 Metabolic encephalopathy: Secondary | ICD-10-CM | POA: Diagnosis not present

## 2020-06-01 DIAGNOSIS — D649 Anemia, unspecified: Secondary | ICD-10-CM | POA: Diagnosis not present

## 2020-06-01 DIAGNOSIS — E782 Mixed hyperlipidemia: Secondary | ICD-10-CM | POA: Diagnosis not present

## 2020-06-01 DIAGNOSIS — N183 Chronic kidney disease, stage 3 unspecified: Secondary | ICD-10-CM | POA: Diagnosis not present

## 2020-06-01 DIAGNOSIS — G8929 Other chronic pain: Secondary | ICD-10-CM

## 2020-06-01 DIAGNOSIS — E1140 Type 2 diabetes mellitus with diabetic neuropathy, unspecified: Secondary | ICD-10-CM | POA: Diagnosis not present

## 2020-06-01 DIAGNOSIS — F329 Major depressive disorder, single episode, unspecified: Secondary | ICD-10-CM | POA: Diagnosis not present

## 2020-06-01 DIAGNOSIS — R296 Repeated falls: Secondary | ICD-10-CM | POA: Diagnosis not present

## 2020-06-01 DIAGNOSIS — I10 Essential (primary) hypertension: Secondary | ICD-10-CM | POA: Diagnosis not present

## 2020-06-01 DIAGNOSIS — K219 Gastro-esophageal reflux disease without esophagitis: Secondary | ICD-10-CM | POA: Diagnosis not present

## 2020-06-01 NOTE — Assessment & Plan Note (Signed)
F/U C&S on 5/17 : no growth. Alert & Oriented on exam 5/24.

## 2020-06-01 NOTE — Progress Notes (Signed)
NURSING HOME LOCATION:  [facility]  Skilled Nursing Facility ROOM NUMBER:  [id number]  CODE STATUS:  Full Code  PCP:  Mardene Sayer MD  This is a comprehensive admission note to this SNFperformed on this date less than 30 days from date of admission. Included are preadmission medical/surgical history; reconciled medication list; family history; social history and comprehensive review of systems.  Corrections and additions to the records were documented. Comprehensive physical exam was also performed. Additionally a clinical summary was entered for each active diagnosis pertinent to this admission in the Problem List to enhance continuity of care.  HPI: Patient was hospitalized 5/17 - 05/31/2020 presenting after unwitnessed fall at home.  Patient could not provide a lucid history and acute metabolic encephalopathy was suspect.  His daughter stated that he fell @ approximately 3 AM; he told her he was trying to rise from bed to go to the bathroom and slipped from the edge of the bed to the floor.  He had crawled to the bathroom and was on the floor when she arrived.  EMS was called for transport to the ED.  He was complaining of back pain and left thigh/hip pain . He had been seen in the ED 2 days prior to this admission and prescribed Keflex for UTI . C&S subsequently documented 80,000 colonies Viridans strep and > 100,000 aerococcus species. In the ED labs revealed slightly elevated troponins and D-dimer without associated chest pain. Urinalysis revealed greater than 50 WBCs.In the context of active Keflex therapy urine culture was negative.  Pelvic and left hip lucency within the pubic symphysis on the left was suspicious for nondisplaced fracture. Follow up CT of the pelvis was negative for fracture.  CT of the head and  cervical, thoracic, lumbar spine revealed no acute fracture or subluxation.  Chronic height loss at L4 was noted with mild spinal canal stenosis at L4-5.  MRI in September 2021 had  revealed moderately severe spinal stenosis at L3-4 and L4-5 with severe subarticular stenosis on the left due to facet hypertrophy and left paracentral disc protrusion.  Neurosurgery recommended no acute intervention.  Lidocaine patch, muscle relaxants, and opiates as needed were recommended. Clinically he had abdominal distention but was asymptomatic in reference to the GI review of systems. With completion of the antibiotics he had no further GU symptoms, fever, leukocytosis. Mental status stabilized; TSH, B12, and ammonia levels were normal. His diabetes was felt to be poorly controlled; A1c was 7.5%. PT/OT recommended placement in the SNF for rehab.  He initially declined this but subsequently agreed.  Past medical and surgical history: Includes OSA on CPAP, chronic diastolic congestive heart failure, history of CAD post PCI,hx of CVA with hemiparesis,hx of prostate cancer, CKD stage IIIa, dyslipidemia, hx of PTE,GERD, and morbid obesity. He has had extensive surgical procedures including bilateral carpal tunnel release, cholecystectomy, coronary artery stenting, seed implant of prostate, anterior cervical decompression/discectomy and fusion, and multiple vascular procedures.  Social history: He is not currently a drinker; he never smoked.He is a retired [place] [employer].  Family history: Extensive history reviewed.   Review of systems: He could not give me the date of the month but knew the month and the year.   He states he has no memory of why he fell.  He does deny any definite postural hypotension symptoms or neuro or cardiac prodrome symptoms prior to the fall.  He does validate that he was trying to get out of bed when he fell and could  not get up from the floor. He continues to have low back pain which he now describes as a "8".  Despite this PT/OT states that he is doing well navigating stairs with 1 person minimal assist and ambulating with a walker. He describes constipation  intermittently. Stools are described as black related to "collard greens" in his diet.   He denies any other active GI or GU symptoms.  Constitutional: No fever, significant weight change  Eyes: No redness, discharge, pain, vision change ENT/mouth: No nasal congestion, purulent discharge, earache, change in hearing, sore throat  Cardiovascular: No chest pain, palpitations, paroxysmal nocturnal dyspnea, claudication, edema  Respiratory: No cough, sputum production, hemoptysis, DOE Gastrointestinal: No heartburn, dysphagia, abdominal pain, nausea /vomiting, rectal bleeding Genitourinary: No dysuria, hematuria, pyuria, incontinence, nocturia Dermatologic: No rash, pruritus, change in appearance of skin Neurologic: No dizziness, headache, syncope, seizures Psychiatric: No significant anxiety, depression, insomnia, anorexia Endocrine: No change in hair/skin/nails, excessive thirst, excessive hunger, excessive urination  Hematologic/lymphatic: No significant bruising, lymphadenopathy, abnormal bleeding Allergy/immunology: No itchy/watery eyes, significant sneezing, urticaria, angioedema  Physical exam:  Pertinent or positive findings: He was in the wheelchair having returned from rehab.  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  Pinpoint pupils are present.  Arcus senilis is noted.  There is slight increase in the first heart sound.  Abdomen is protuberant.  He has 1/2+ edema at the sock line.  Strength to opposition is fair and seemingly slightly stronger in the lower extremities than in the upper extremities.  General appearance: Adequately nourished; no acute distress, increased work of breathing is present.   Lymphatic: No lymphadenopathy about the head, neck, axilla. Eyes: No conjunctival inflammation or lid edema is present. There is no scleral icterus. Ears:  External ear exam shows no significant lesions or deformities.   Nose:  External nasal examination shows no deformity or inflammation. Nasal  mucosa are pink and moist without lesions, exudates Oral exam: Lips and gums are healthy appearing.There is no oropharyngeal erythema or exudate. Neck:  No thyromegaly, masses, tenderness noted.    Heart:  Normal rate and regular rhythm. S2 normal without gallop, murmur, click, rub.  Lungs: Chest clear to auscultation without wheezes, rhonchi, rales, rubs. Abdomen: Bowel sounds are normal.  Abdomen is soft and nontender with no organomegaly, hernias, masses. GU: Deferred  Extremities:  No cyanosis, clubbing Neurologic exam: Balance, Rhomberg, finger to nose testing could not be completed due to clinical state Skin: Warm & dry w/o tenting. No significant lesions or rash.  See clinical summary under each active problem in the Problem List with associated updated therapeutic plan

## 2020-06-01 NOTE — Assessment & Plan Note (Signed)
PT/OT @ SNF. Wean & D/C Beers' List meds ASAP. Monitor for postural hypotension.

## 2020-06-01 NOTE — Patient Instructions (Signed)
See assessment and plan under each diagnosis in the problem list and acutely for this visit 

## 2020-06-02 ENCOUNTER — Ambulatory Visit: Payer: Medicare Other

## 2020-06-02 NOTE — Assessment & Plan Note (Signed)
AMS resolved post antibiotics for UTI

## 2020-06-02 NOTE — Assessment & Plan Note (Signed)
NS F/U if pain progressive or associated with stool or urine incontinence

## 2020-06-04 ENCOUNTER — Encounter: Payer: Self-pay | Admitting: Adult Health

## 2020-06-04 ENCOUNTER — Non-Acute Institutional Stay (SKILLED_NURSING_FACILITY): Payer: Medicare Other | Admitting: Adult Health

## 2020-06-04 DIAGNOSIS — E1149 Type 2 diabetes mellitus with other diabetic neurological complication: Secondary | ICD-10-CM | POA: Diagnosis not present

## 2020-06-04 DIAGNOSIS — I10 Essential (primary) hypertension: Secondary | ICD-10-CM

## 2020-06-04 MED ORDER — HYDROCHLOROTHIAZIDE 12.5 MG PO CAPS
12.5000 mg | ORAL_CAPSULE | Freq: Every day | ORAL | 1 refills | Status: DC
Start: 2020-06-04 — End: 2020-06-11

## 2020-06-04 NOTE — Progress Notes (Signed)
Location:  [facility] Room Number: 108-A Place of Service:  SNF (31) Provider:  Durenda Age, DNP, FNP-BC  Patient Care Team: Lawerance Cruel, MD as PCP - General (Family Medicine) Jettie Booze, MD as PCP - Cardiology (Cardiology) Lawerance Cruel, MD (Family Medicine)  Extended Emergency Contact Information Primary Emergency Contact: Pamelia Hoit [place] Mobile Phone: [phone] Relation: Daughter Secondary Emergency Contact: Ryan,Margaret  [place] Phone: [phone] Mobile Phone: [phone] Relation: Significant other  Code Status:    Goals of care: Advanced Directive information Advanced Directives 06/11/2020  Does Patient Have a Medical Advance Directive? No  Type of Advance Directive -  Does patient want to make changes to medical advance directive? No - Patient declined  Copy of [place] in Chart? -  Would patient like information on creating a medical advance directive? -  Pre-existing out of facility DNR order (yellow form or pink MOST form) -     Chief Complaint  Patient presents with  . Acute Visit    Diabetes Management and Hypertension    HPI:  Pt is a 80 y.o. male seen today for diabetes management. He stated that at home he gets his [place] in the morning instead of bedtime. CBGs ranging from 103 to 177. He takes Januvia 50 mg daily, Novolog 5 units TID for CBG >200 and Tresiba 34 units SQ @ HS for Diabetes mellitus. BPs ranging from 120 to 174. He takes Losartan 100 mg daily for hypertension.  Past Medical History:  Diagnosis Date  . Abnormal prostate biopsy   . Anticoagulant long-term use    currently xarelto  . BPH with elevated PSA   . CKD (chronic kidney disease), stage II   . Complication of anesthesia    limted neck rom limited use of left arm due to cva  . Coronary artery disease    CARDIOLOGIST-  DR Irish Lack--  2010-- PCI w/ stenting  midLAD  . DDD (degenerative disc disease), lumbar   . Degeneration of cervical intervertebral disc   . Depression   . Diabetes mellitus without complication ([place])   . Dyspnea on exertion   . GERD (gastroesophageal reflux disease)   . Hemiparesis due to cerebral infarction   . History of cerebrovascular accident (CVA) with residual deficit 2002 and 2003--  hemiparisis both sides   per MRI  anterior left frontal lobe, left para midline pons, and inferior cerebullam bilaterally infarcts  . History of pulmonary embolus (PE)    06-30-2012  extensive bilaterally  . History of recurrent TIAs   . History of syncope    hx multiple pre-syncope and syncopal episodes due to vasovagal, orthostatic hypotension, dehydration  . History of TIAs    several since 2002  . Hyperlipidemia   . Hypertension   . Mild atherosclerosis of carotid artery, bilateral    per last duplex 11-04-2014  bilateral ICA 1--39%  . Neuropathy    fingers  . OSA on CPAP    followed by dr dohmeier--  sev. osa w/ AHI 65.9  . Prostate cancer ([place]) dx 2018  . Renal insufficiency   . S/P coronary artery stent placement 2010   stenting to mid LAD  . Simple renal cyst    bilaterally  . Stroke ([place])   . Trigger finger of both hands 11-17-13  . Type 2 diabetes mellitus ([place]) dx 1986   last one A1c 9.2 on 04-26-2016  . Unsteady gait    .  Hx prior CVA/TIAs;  . Vertebral artery occlusion, left    chronic   Past Surgical History:  Procedure Laterality Date  . ANTERIOR CERVICAL DECOMP/DISCECTOMY FUSION  2004   C3 -- C6 limited rom  . CARDIAC CATHETERIZATION  06-10-2010   dr Irish Lack   wide patent LAD stent, mid lesion at the origin of the septal prior to the previous stent 40-50%/  normal LVF, ef 55%  . CARDIOVASCULAR STRESS TEST  10-23-2012  dr Irish Lack   normal nuclear perfusion study w/ no ischemia/  normal LV function and wall motion , ef 65%  . CARPAL TUNNEL RELEASE Bilateral   . CATARACT EXTRACTION W/ INTRAOCULAR LENS   IMPLANT, BILATERAL    . CHOLECYSTECTOMY N/A 11/02/2015   Procedure: LAPAROSCOPIC CHOLECYSTECTOMY WITH INTRAOPERATIVE CHOLANGIOGRAM;  Surgeon: Donnie Mesa, MD;  Location: [facility];  Service: General;  Laterality: N/A;  . COLONOSCOPY    . CORONARY ANGIOPLASTY WITH STENT PLACEMENT  02/2008   stenting to mid LAD  . GOLD SEED IMPLANT N/A 11/15/2016   Procedure: GOLD SEED IMPLANT TIMES THREE;  Surgeon: Ardis Hughs, MD;  Location: [facility];  Service: Urology;  Laterality: N/A;  . IR ANGIO INTRA EXTRACRAN SEL COM CAROTID INNOMINATE BILAT MOD SED  06/13/2018  . IR ANGIO VERTEBRAL SEL VERTEBRAL UNI R MOD SED  06/13/2018  . IR US GUIDE VASC ACCESS RIGHT  06/13/2018  . LEFT HEART CATH AND CORONARY ANGIOGRAPHY N/A 05/25/2017   Procedure: LEFT HEART CATH AND CORONARY ANGIOGRAPHY;  Surgeon: Jettie Booze, MD;  Location: [facility];  Service: Cardiovascular;  Laterality: N/A;  . LEFT HEART CATHETERIZATION WITH CORONARY ANGIOGRAM N/A 04/03/2013   Procedure: LEFT HEART CATHETERIZATION WITH CORONARY ANGIOGRAM;  Surgeon: Jettie Booze, MD;  Location: [facility] C[facility];  Service: Cardiovascular;  Laterality: N/A;  patent mLAD stent  w/ mild disease in remainder LAD and its branches;  mod. focal lesion midLCFx- FFR of lesion was negative for ischemia/  normal LVSF, ef 50%  . lungs  2005   "fluid pumped off lungs"  . NEUROPLASTY / TRANSPOSITION ULNAR NERVE AT ELBOW Right 2004  . PROSTATE BIOPSY N/A 08/31/2016   Procedure: PROSTATE  BIOPSY TRANSRECTAL ULTRASONIC PROSTATE (TUBP);  Surgeon: Ardis Hughs, MD;  Location[facility];  Service: Urology;  Laterality: N/A;  . SPACE OAR INSTILLATION N/A 11/15/2016   Procedure: SPACE OAR INSTILLATION;  Surgeon: Ardis Hughs, MD;  Location: [facility];  Service: Urology;  Laterality: N/A;  . TRANSTHORACIC ECHOCARDIOGRAM  04/27/2016   severe focal basal LVH, ef 60-65%,  grade 2 diastoilc dysfunction/   mild AR, MR, and TR/  atrial septum lipomatous hypertrophy/  PASP 27mmHg  . UMBILICAL HERNIA REPAIR      Allergies  Allergen Reactions  . Other Other (See Comments)    Per patient- cardiac cath dye-  "woke up during procedure hysterical."  . Phenergan [Promethazine] Other (See Comments)    Mood changes   . Iohexol Other (See Comments)    Patient refuses contrast after having a hysterical event in hospital //r ls spoke with patient   . Iohexol   . Phenergan [Promethazine]     Other reaction(s): syncope  . Tramadol     Outpatient Encounter Medications as of 06/04/2020  Medication Sig  . acetaminophen (TYLENOL) 500 MG tablet Take 500 mg by mouth every 6 (six) hours as needed (pain).  . bisacodyl (DULCOLAX) 10 MG suppository Place 1 suppository (10 mg total)  rectally daily as needed for moderate constipation.  . diclofenac Sodium (VOLTAREN) 1 % GEL Apply 4 g topically 4 (four) times daily.  . fluticasone (FLONASE) 50 MCG/ACT nasal spray Place 1 spray into both nostrils daily as needed for allergies.  . furosemide (LASIX) 20 MG tablet Take 20-40 mg by mouth daily as needed for fluid.  . hydrochlorothiazide (MICROZIDE) 12.5 MG capsule Take 1 capsule (12.5 mg total) by mouth daily.  Marland Kitchen lidocaine (LIDODERM) 5 % Place 1 patch onto the skin daily. Remove & Discard patch within 12 hours or as directed by MD  . losartan (COZAAR) 100 MG tablet Take 100 mg by mouth daily.  . methocarbamol (ROBAXIN) 500 MG tablet Take 1 tablet (500 mg total) by mouth every 8 (eight) hours as needed for muscle spasms.  . pantoprazole (PROTONIX) 40 MG tablet Take 40 mg by mouth every morning.  . polyethylene glycol (MIRALAX / GLYCOLAX) 17 g packet Take 17 g by mouth daily.  . pregabalin (LYRICA) 300 MG capsule Take 300 mg by mouth 2 (two) times daily.  . rosuvastatin (CRESTOR) 10 MG tablet Take 10 mg by mouth daily.  . sitaGLIPtin (JANUVIA) 50 MG tablet Take 50 mg by mouth daily.  Tyler Aas FLEXTOUCH 100 UNIT/ML  FlexTouch Pen Inject 34 Units into the skin daily.  Alveda Reasons 20 MG TABS tablet Take 20 mg by mouth daily.  . [DISCONTINUED] NOVOLOG FLEXPEN 100 UNIT/ML FlexPen Inject 0-20 Units into the skin See admin instructions. Tid with meals per sliding scale  . [DISCONTINUED] oxyCODONE (OXY IR/ROXICODONE) 5 MG immediate release tablet Take 1 tablet (5 mg total) by mouth every 6 (six) hours as needed for up to 6 doses for severe pain.  . [DISCONTINUED] JANUVIA 50 MG tablet Take 50 mg by mouth daily.  . [DISCONTINUED] TRESIBA FLEXTOUCH 100 UNIT/ML SOPN FlexTouch Pen Inject 26 Units into the skin every morning.   . [DISCONTINUED] valACYclovir (VALTREX) 500 MG tablet Take 500 mg by mouth daily.   No facility-administered encounter medications on file as of 06/04/2020.    Review of Systems  GENERAL: No change in appetite, no fatigue, no weight changes, no fever, chills or weakness MOUTH and THROAT: Denies oral discomfort, gingival pain or bleeding RESPIRATORY: no cough, SOB, DOE, wheezing, hemoptysis CARDIAC: No chest pain, edema or palpitations GI: No abdominal pain, diarrhea, constipation, heart burn, nausea or vomiting GU: Denies dysuria, frequency, hematuria, incontinence, or discharge NEUROLOGICAL: Denies dizziness, syncope, numbness, or headache PSYCHIATRIC: Denies feelings of depression or anxiety. No report of hallucinations, insomnia, paranoia, or agitation   Immunization History  Administered Date(s) Administered  . Influenza, High Dose Seasonal PF 09/24/2017, 08/19/2018  . Zoster Recombinat (Shingrix) 10/05/2017   Pertinent  Health Maintenance Due  Topic Date Due  . FOOT EXAM  Never done  . OPHTHALMOLOGY EXAM  Never done  . PNA vac Low Risk Adult (1 of 2 - PCV13) Never done  . INFLUENZA VACCINE  08/09/2020  . HEMOGLOBIN A1C  11/26/2020   Fall Risk  09/09/2019 08/01/2019 04/28/2019 03/04/2019 02/10/2019  Falls in the past year? 1 1 0 0 0  Number falls in past yr: 0 0 0 0 0  Injury with  Fall? 1 0 0 0 0  Comment muscle pain - - - -  Risk for fall due to : History of fall(s) Impaired mobility Impaired mobility Other (Comment);Impaired mobility Other (Comment)  Risk for fall due to: Comment - - - reports lightheadedness at times has been experiencing lightheadedness  Follow up Falls evaluation completed;Falls prevention discussed Falls prevention discussed;Education provided;Falls evaluation completed Falls prevention discussed;Education provided;Falls evaluation completed Falls prevention discussed;Education provided;Falls evaluation completed Falls prevention discussed;Education provided;Falls evaluation completed     Vitals:   06/04/20 1109  BP: (!) 163/76  Pulse: 76  Resp: 17  Temp: (!) 97.3 F (36.3 C)  Weight: 203 lb 3.2 oz (92.2 kg)  Height: 5\' 5"  (1.651 m)   Body mass index is 33.81 kg/m.  Physical Exam  GENERAL APPEARANCE: Well nourished. In no acute distress. Obese. SKIN:  Skin is warm and dry.  MOUTH and THROAT: Lips are without lesions. Oral mucosa is moist and without lesions.  RESPIRATORY: Breathing is even & unlabored, BS CTAB CARDIAC: RRR, no murmur,no extra heart sounds, no edema GI: Abdomen soft, normal BS, no masses, no tenderness EXTREMITIES:  Able to move X 4 extremities NEUROLOGICAL: There is no tremor. Speech is clear. Alert and oriented X 3. PSYCHIATRIC:  Affect and behavior are appropriate  Labs reviewed: Recent Labs    08/12/19 0424 08/13/19 0110 08/14/19 0307 05/25/20 2032 05/26/20 0728 05/27/20 0143   NA 139 137   < > 134* 136 133*   K 3.8 4.4   < > 4.3 4.2 4.0   CL 99 96*   < > 100 104 101   CO2 30 30   < > 25 22 24    GLUCOSE 176* 196*   < > 358* 251* 168*   BUN 16 30*   < > 20 17 20    CREATININE 1.39* 1.95*   < > 1.45* 1.17 1.18   CALCIUM 9.1 9.1   < > 9.2 8.6* 8.5*   MG 1.7  --   --   --   --   --    PHOS 4.0 4.8*  --   --   --   --     < > = values in this interval not displayed.   Recent Labs    05/24/20 1500  05/25/20 2032 05/26/20 0728 05/27/20 0143  AST 31 25  --  23  22  ALT 25 25  --  19  19  ALKPHOS 72 65  --  48  47  BILITOT 0.8 1.7* 1.1 1.0  0.8  PROT 8.0 7.5  --  6.5  6.2*  ALBUMIN 4.1 3.7  --  2.9*  2.9*   Recent Labs    08/13/19 0110 05/24/20 1500 05/25/20 2032 05/27/20 0143 05/28/20 0325 05/29/20 0156   WBC 5.0 7.7 8.8 8.3 5.9 5.8   NEUTROABS 2.3 4.3 6.6  --   --   --    HGB 12.9* 14.5 13.9 12.7* 12.0* 12.0*   HCT 38.0* 42.9 41.4 37.1* 35.3* 35.5*   MCV 93.1 92.9 93.9 92.5 91.9 91.7   PLT 222 185 162 160 169 168    Lab Results  Component Value Date   TSH 1.731 05/26/2020   Lab Results  Component Value Date   HGBA1C 7.5 (H) 05/26/2020   Lab Results  Component Value Date   CHOL 139 11/24/2019   HDL 53 11/24/2019   LDLCALC 72 11/24/2019   TRIG 70 11/24/2019   CHOLHDL 2.6 11/24/2019    Significant Diagnostic Results in last 30 days:  CT ABDOMEN PELVIS WO CONTRAST  Result Date: 05/26/2020 CLINICAL DATA:  Abdominal distension EXAM: CT ABDOMEN AND PELVIS WITHOUT CONTRAST TECHNIQUE: Multidetector CT imaging of the abdomen and pelvis was performed following the standard protocol without IV contrast. COMPARISON:  CT  renal colic 95/62/1308 FINDINGS: Lower chest: Mild cardiomegaly. Three-vessel coronary artery atherosclerosis. Distal thoracic aortic atherosclerosis. Small hiatal hernia and hyperdense contrast media present within the distal thoracic esophagus. Bibasilar atelectasis with additional bandlike areas of scarring or subsegmental atelectasis. No visible effusion. Hepatobiliary: No visible focal liver lesion within limitations of an unenhanced CT. Normal hepatic attenuation. Smooth surface contour. Prior cholecystectomy. No visible calcified gallstones or significant biliary ductal dilatation. Pancreas: Partial fatty replacement of the pancreas. No pancreatic ductal dilatation or surrounding inflammatory changes. Spleen: Normal in size. No concerning splenic  lesions. Adrenals/Urinary Tract: Normal adrenal glands. Bilateral renal cortical atrophy. Increased bilateral perinephric stranding when compared to recent imaging performed 05/24/2020. Few fluid attenuation cysts are again noted. No concerning visible or contour deforming renal lesions. No urolithiasis or hydronephrosis. Left posterolateral bladder diverticulum is noted, some hazy stranding is noted adjacent this diverticulum, not as clearly evident on comparison. The indentation of bladder base by an enlarged prostate. Stomach/Bowel: Small hiatal hernia and high attenuation contrast media retained within the distal thoracic esophagus. Contrast media has traversed to the level of the transverse colon. No evidence of bowel obstruction. No conspicuous large or small thickening or dilatation. Contrast media opacifies a normal caliber noninflamed appendix in the right lower quadrant as well. Vascular/Lymphatic: Atherosclerotic calcifications within the abdominal aorta and branch vessels. No aneurysm or ectasia. No enlarged abdominopelvic lymph nodes. Reproductive: The prostate and seminal vesicles are unremarkable. Multiple calcified phleboliths in the pelvis and along the spermatic cords. Other: No abdominopelvic free air or fluid no bowel containing hernia. Abdominal wall laxity is similar to prior. Musculoskeletal: Multilevel degenerative changes are present in the imaged portions of the spine. Additional degenerative changes in both hips and pelvis. Partial fusion across the SI joints. No concerning lytic or blastic lesions or other acute osseous abnormalities. IMPRESSION: 1. No evidence of high-grade bowel obstruction or other acute bowel abnormality. Abdominal wall laxity is not significantly changed from comparison. The 2. Increasing bilateral perinephric stranding as well as some hazy stranding adjacent a previously identified bladder diverticula. Findings are concerning for possible ascending urinary tract  infection. Background bilateral renal atrophy/scarring. 3. Indentation of the bladder base by an enlarged prostate could reflect some chronic outlet obstruction correlate with clinical symptoms. 4. Small hiatal hernia with high attenuation contrast media retained or refluxed into the distal thoracic esophagus, correlate for clinical symptoms. 5. Borderline cardiomegaly.  Coronary artery atherosclerosis. 6.  Aortic Atherosclerosis (ICD10-I70.0). 7. Mild body wall edema, correlate with fluid status. Electronically Signed   By: Lovena Le M.D.   On: 05/26/2020 21:59   DG Abd 1 View  Result Date: 05/26/2020 CLINICAL DATA:  80 year old male with abdominal distension. EXAM: ABDOMEN - 1 VIEW COMPARISON:  CT Abdomen and Pelvis 05/24/2020. FINDINGS: AP view at 0635 hours. Non obstructed bowel gas pattern. Stable cholecystectomy clips. Partially visible surgical clips about the prostate. Pelvic phleboliths. No acute osseous abnormality identified. IMPRESSION: Non obstructed bowel gas pattern. Electronically Signed   By: Genevie Ann M.D.   On: 05/26/2020 07:04   CT Head Wo Contrast  Result Date: 05/25/2020 CLINICAL DATA:  Status post fall. EXAM: CT HEAD WITHOUT CONTRAST TECHNIQUE: Contiguous axial images were obtained from the base of the skull through the vertex without intravenous contrast. COMPARISON:  September 03, 2019 FINDINGS: Brain: There is mild cerebral atrophy with widening of the extra-axial spaces and ventricular dilatation. There are areas of decreased attenuation within the white matter tracts of the supratentorial brain, consistent with microvascular  disease changes. Vascular: No hyperdense vessel or unexpected calcification. Skull: Normal. Negative for fracture or focal lesion. Sinuses/Orbits: There is stable marked severity posterior left ethmoid sinus and sphenoid sinus mucosal thickening. Other: A 2.8 cm x 0.6 cm left frontal scalp lipoma is noted (approximately -108.63 Hounsfield units). IMPRESSION: 1.  Generalized cerebral atrophy. 2. No acute intracranial abnormality. 3. Chronic left ethmoid sinus and sphenoid sinus disease. Electronically Signed   By: Virgina Norfolk M.D.   On: 05/25/2020 21:26   CT Cervical Spine Wo Contrast  Result Date: 05/25/2020 CLINICAL DATA:  Status post fall. EXAM: CT CERVICAL SPINE WITHOUT CONTRAST TECHNIQUE: Multidetector CT imaging of the cervical spine was performed without intravenous contrast. Multiplanar CT image reconstructions were also generated. COMPARISON:  September 03, 2019 FINDINGS: Alignment: Normal. Skull base and vertebrae: No acute fracture. A radiopaque fixation plate and fixation screws are seen along the anterior aspect of the C3, C4, C5, C6 and C7 levels. Arthrodesis is also seen throughout the fused levels. These areas are unchanged in appearance and position. Associated streak artifact is noted with subsequently limited evaluation of the adjacent osseous and soft tissue structures. Soft tissues and spinal canal: No prevertebral fluid or swelling. No visible canal hematoma. Disc levels: Stable moderate to marked severity endplate sclerosis and associated intervertebral disc space narrowing is noted at the levels of C6-C7 and C7-T1. Bilateral moderate severity multilevel facet joint hypertrophy is noted. Upper chest: Negative. Other: None. IMPRESSION: 1. Stable extensive postoperative changes consistent with C3 through C7 fusion with corpectomy. 2. No acute cervical spine fracture. 3. Stable degenerative changes at the levels of C6-C7 and C7-T1. Electronically Signed   By: Virgina Norfolk M.D.   On: 05/25/2020 21:31   CT Thoracic Spine Wo Contrast  Result Date: 05/25/2020 CLINICAL DATA:  Fall EXAM: CT THORACIC AND LUMBAR SPINE WITHOUT CONTRAST TECHNIQUE: Multidetector CT imaging of the thoracic and lumbar spine was performed without contrast. Multiplanar CT image reconstructions were also generated. COMPARISON:  Lumbar spine CT 09/03/2019 FINDINGS: CT  THORACIC SPINE FINDINGS Alignment: Normal. Vertebrae: No acute fracture or focal pathologic process. Paraspinal and other soft tissues: Calcific aortic atherosclerosis. Disc levels: No bony spinal canal stenosis. CT LUMBAR SPINE FINDINGS Segmentation: 5 lumbar type vertebrae. Alignment: Normal. Vertebrae: No acute fracture or focal pathologic process. Chronic height loss at L4 is unchanged. Paraspinal and other soft tissues: Negative Disc levels: Mild spinal canal stenosis at L4-5. IMPRESSION: 1. No acute fracture or static subluxation of the thoracic or lumbar spine. 2. Chronic height loss at L4, unchanged. 3. Mild spinal canal stenosis at L4-5. Aortic Atherosclerosis (ICD10-I70.0). Electronically Signed   By: Ulyses Jarred M.D.   On: 05/25/2020 21:51   CT Lumbar Spine Wo Contrast  Result Date: 05/25/2020 CLINICAL DATA:  Fall EXAM: CT THORACIC AND LUMBAR SPINE WITHOUT CONTRAST TECHNIQUE: Multidetector CT imaging of the thoracic and lumbar spine was performed without contrast. Multiplanar CT image reconstructions were also generated. COMPARISON:  Lumbar spine CT 09/03/2019 FINDINGS: CT THORACIC SPINE FINDINGS Alignment: Normal. Vertebrae: No acute fracture or focal pathologic process. Paraspinal and other soft tissues: Calcific aortic atherosclerosis. Disc levels: No bony spinal canal stenosis. CT LUMBAR SPINE FINDINGS Segmentation: 5 lumbar type vertebrae. Alignment: Normal. Vertebrae: No acute fracture or focal pathologic process. Chronic height loss at L4 is unchanged. Paraspinal and other soft tissues: Negative Disc levels: Mild spinal canal stenosis at L4-5. IMPRESSION: 1. No acute fracture or static subluxation of the thoracic or lumbar spine. 2. Chronic height loss at L4, unchanged.  3. Mild spinal canal stenosis at L4-5. Aortic Atherosclerosis (ICD10-I70.0). Electronically Signed   By: Ulyses Jarred M.D.   On: 05/25/2020 21:51   CT PELVIS WO CONTRAST  Result Date: 05/25/2020 CLINICAL DATA:  Findings  suspicious for pubic symphysis fracture on the left on recent plain film EXAM: CT PELVIS WITHOUT CONTRAST TECHNIQUE: Multidetector CT imaging of the pelvis was performed following the standard protocol without intravenous contrast. COMPARISON:  Plain film from earlier in the same day. FINDINGS: Urinary Tract: Bladder is well distended. 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 Bowel: Visualized bowel is unremarkable. The appendix is within normal limits. Vascular/Lymphatic: Vascular calcifications are seen. No significant lymphadenopathy is noted. Multiple phleboliths are noted within the scrotum. Reproductive:  Prostate is within normal limits. Other:  None. Musculoskeletal: Bony structures show no acute fracture. The areas of abnormality in the pubic symphysis were artifactual related to summation of shadows. Degenerative changes of lumbar spine and hip joints are seen. IMPRESSION: No acute fracture noted. 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 No other focal abnormality is seen. Electronically Signed   By: Inez Catalina M.D.   On: 05/25/2020 22:38   DG Chest Portable 1 View  Result Date: 05/25/2020 CLINICAL DATA:  Recent fall with chest pain, initial encounter EXAM: PORTABLE CHEST 1 VIEW COMPARISON:  09/03/2019 FINDINGS: Cardiac shadow is enlarged but accentuated by the frontal technique. Aortic calcifications are again noted. Lungs are hypoaerated bilaterally without focal infiltrate or sizable effusion. No acute bony abnormality is noted. IMPRESSION: Poor inspiratory effort.  No acute abnormality noted. Electronically Signed   By: Inez Catalina M.D.   On: 05/25/2020 20:53   EEG adult  Result Date: 05/26/2020 Lora Havens, MD     05/26/2020 11:47 AM Patient Name: Sabri Teal MRN: [id number] Epilepsy Attending: Lora Havens Referring Physician/Provider: Dr Derrick Ravel Date: 05/26/2020 Duration: 25 mins Patient history: 80yo M with ams, syncope. EEG to evaluate for  seizure. Level of alertness: Awake, asleep AEDs during EEG study: Pregabalin Technical aspects: This EEG study was done with scalp electrodes positioned according to the 10-20 International system of electrode placement. Electrical activity was acquired at a sampling rate of 500Hz  and reviewed with a high frequency filter of 70Hz  and a low frequency filter of 1Hz . EEG data were recorded continuously and digitally stored. Description: The posterior dominant rhythm consists of 8-9 Hz activity of moderate voltage (25-35 uV) seen predominantly in posterior head regions, symmetric and reactive to eye opening and eye closing. Sleep was characterized by vertex waves, sleep spindles (12 to 14 Hz), maximal frontocentral region. Hyperventilation and photic stimulation were not performed.   IMPRESSION: This study is within normal limits. No seizures or epileptiform discharges were seen throughout the recording. Lora Havens   ECHOCARDIOGRAM COMPLETE  Result Date: 05/26/2020    ECHOCARDIOGRAM REPORT   Patient Name:   CHRISTIAN BORGERDING Date of Exam: 05/26/2020 Medical Rec #:  [id number]        Height:       65.0 in Accession #:    [id number]       Weight:       215.0 lb Date of Birth:  [birth year]         BSA:          2.040 m Patient Age:    30 years         BP:           151/80 mmHg Patient Gender: M  HR:           66 bpm. Exam Location:  Inpatient Procedure: 2D Echo, Cardiac Doppler and Color Doppler Indications:    Syncope  History:        Patient has no prior history of Echocardiogram examinations.                 CAD, Stroke and COPD, Signs/Symptoms:Syncope; Risk                 Factors:Diabetes, Hypertension, Dyslipidemia and Sleep Apnea.                 CKD, pul.embolus.  Sonographer:    Dustin Flock Referring Phys: [id number] [place]  1. Left ventricular ejection fraction, by estimation, is 60 to 65%. The left ventricle has normal function. The left ventricle has no regional wall  motion abnormalities. Left ventricular diastolic parameters are consistent with Grade I diastolic dysfunction (impaired relaxation).  2. Right ventricular systolic function is normal. The right ventricular size is normal. There is mildly elevated pulmonary artery systolic pressure.  3. Left atrial size was mildly dilated.  4. Right atrial size was mildly dilated.  5. The mitral valve is normal in structure. No evidence of mitral valve regurgitation. No evidence of mitral stenosis.  6. The aortic valve is normal in structure. There is mild calcification of the aortic valve. Aortic valve regurgitation is trivial. No aortic stenosis is present.  7. The inferior vena cava is normal in size with greater than 50% respiratory variability, suggesting right atrial pressure of 3 mmHg. FINDINGS  Left Ventricle: Left ventricular ejection fraction, by estimation, is 60 to 65%. The left ventricle has normal function. The left ventricle has no regional wall motion abnormalities. The left ventricular internal cavity size was normal in size. There is  no left ventricular hypertrophy. Left ventricular diastolic parameters are consistent with Grade I diastolic dysfunction (impaired relaxation). Right Ventricle: The right ventricular size is normal. No increase in right ventricular wall thickness. Right ventricular systolic function is normal. There is mildly elevated pulmonary artery systolic pressure. The tricuspid regurgitant velocity is 2.93  m/s, and with an assumed right atrial pressure of 3 mmHg, the estimated right ventricular systolic pressure is 45.4 mmHg. Left Atrium: Left atrial size was mildly dilated. Right Atrium: Right atrial size was mildly dilated. Pericardium: There is no evidence of pericardial effusion. Mitral Valve: The mitral valve is normal in structure. No evidence of mitral valve regurgitation. No evidence of mitral valve stenosis. Tricuspid Valve: The tricuspid valve is normal in structure. Tricuspid valve  regurgitation is trivial. No evidence of tricuspid stenosis. Aortic Valve: The aortic valve is normal in structure. There is mild calcification of the aortic valve. Aortic valve regurgitation is trivial. No aortic stenosis is present. Pulmonic Valve: The pulmonic valve was normal in structure. Pulmonic valve regurgitation is trivial. No evidence of pulmonic stenosis. Aorta: The aortic root is normal in size and structure. Venous: The inferior vena cava is normal in size with greater than 50% respiratory variability, suggesting right atrial pressure of 3 mmHg. IAS/Shunts: No atrial level shunt detected by color flow Doppler.  LEFT VENTRICLE PLAX 2D LVIDd:         4.10 cm  Diastology LVIDs:         2.80 cm  LV e' medial:    4.57 cm/s LV PW:         1.50 cm  LV E/e' medial:  11.2 LV IVS:  1.60 cm  LV e' lateral:   4.24 cm/s LVOT diam:     2.20 cm  LV E/e' lateral: 12.0 LV SV:         62 LV SV Index:   31 LVOT Area:     3.80 cm  RIGHT VENTRICLE RV Basal diam:  3.20 cm RV S prime:     5.98 cm/s TAPSE (M-mode): 2.2 cm LEFT ATRIUM             Index       RIGHT ATRIUM           Index LA diam:        3.90 cm 1.91 cm/m  RA Area:     20.00 cm LA Vol (A2C):   59.7 ml 29.27 ml/m RA Volume:   53.80 ml  26.37 ml/m LA Vol (A4C):   30.9 ml 15.15 ml/m LA Biplane Vol: 47.5 ml 23.29 ml/m  AORTIC VALVE LVOT Vmax:   80.90 cm/s LVOT Vmean:  54.100 cm/s LVOT VTI:    0.164 m  AORTA Ao Root diam: 3.20 cm MITRAL VALVE               TRICUSPID VALVE MV Area (PHT): 3.12 cm    TR Peak grad:   34.3 mmHg MV Decel Time: 243 msec    TR Vmax:        293.00 cm/s MV E velocity: 51.00 cm/s MV A velocity: 75.40 cm/s  SHUNTS MV E/A ratio:  0.68        Systemic VTI:  0.16 m                            Systemic Diam: 2.20 cm Glori Bickers MD Electronically signed by Glori Bickers MD Signature Date/Time: 05/26/2020/2:40:51 PM    Final    CT Renal Stone Study  Result Date: 05/24/2020 CLINICAL DATA:  80 year old male with right-sided low  back and flank pain. Suspected nephrolithiasis. EXAM: CT ABDOMEN AND PELVIS WITHOUT CONTRAST TECHNIQUE: Multidetector CT imaging of the abdomen and pelvis was performed following the standard protocol without IV contrast. COMPARISON:  12/26/2019 FINDINGS: Lower chest: No acute abnormality. Hepatobiliary: The liver is normal in size, contour, and attenuation. Unchanged 1.4 cm hypoattenuating mass about the subcapsular segment 8, again favored to likely represent benign hemangioma. The gallbladder surgically absent. No intra or extrahepatic biliary ductal dilation. Pancreas: Unremarkable. No pancreatic ductal dilatation or surrounding inflammatory changes. Spleen: Normal in size without focal abnormality. Adrenals/Urinary Tract: Adrenal glands are unremarkable. Left interpolar simple cortical cyst measuring up to 16 mm. Kidneys are normal, without renal calculi, focal lesion, or hydronephrosis. No bladder wall thickening. Unchanged left bladder wall diverticulum. Stomach/Bowel: Stomach is within normal limits. Appendix appears normal. No evidence of bowel wall thickening, distention, or inflammatory changes. Vascular/Lymphatic: Aortic atherosclerosis. No enlarged abdominal or pelvic lymph nodes. Reproductive: Prostate is unremarkable. Right greater than left bilateral hydrocele. Other: No abdominal wall hernia or abnormality. No abdominopelvic ascites. Musculoskeletal: Multilevel degenerative changes of the thoracolumbar spine. No acute osseous abnormality or aggressive appearing osseous lesion. IMPRESSION: No acute abdominopelvic abnormality, nephrolithiasis, or hydronephrosis. Electronically Signed   By: Ruthann Cancer MD   On: 05/24/2020 14:58   DG Hip Port [place] W or Wo Pelvis 1 View Left  Result Date: 05/25/2020 CLINICAL DATA:  Recent fall with hip pain, initial encounter EXAM: DG HIP (WITH OR WITHOUT PELVIS) 3V PORT LEFT COMPARISON:  None. FINDINGS: There is lucency identified  in the pubic symphysis on the  left suspicious for undisplaced fracture. Proximal femur appears within normal limits. No soft tissue abnormality is noted. IMPRESSION: Lucency within the pubic symphysis on the left suspicious for undisplaced fracture. Electronically Signed   By: Inez Catalina M.D.   On: 05/25/2020 20:58   VAS US CAROTID  Result Date: 05/26/2020 Carotid Arterial Duplex Study Patient Name:  YADIR ZENTNER  Date of Exam:   05/26/2020 Medical Rec #: [id number]         Accession #:    [id number] Date of Birth: 30-May-1940          Patient Gender: M Patient Age:   80Y Exam Location:  [facility] Procedure:      VAS US CAROTID Referring Phys: [id number] [place] --------------------------------------------------------------------------------  Indications:      Syncope. Risk Factors:     Hypertension, hyperlipidemia, Diabetes, coronary artery                   disease. Comparison Study: No prior studies. Performing Technologist: Darlin Coco RDMS,RVT  Examination Guidelines: A complete evaluation includes B-mode imaging, spectral Doppler, color Doppler, and power Doppler as needed of all accessible portions of each vessel. Bilateral testing is considered an integral part of a complete examination. Limited examinations for reoccurring indications may be performed as noted.  Right Carotid Findings: +----------+--------+--------+--------+------------------+--------+           PSV cm/sEDV cm/sStenosisPlaque DescriptionComments +----------+--------+--------+--------+------------------+--------+ CCA Prox  74      12                                         +----------+--------+--------+--------+------------------+--------+ CCA Distal85      17              calcific                   +----------+--------+--------+--------+------------------+--------+ ICA Prox  90      18      1-39%   calcific                   +----------+--------+--------+--------+------------------+--------+ ICA Distal83      24                                          +----------+--------+--------+--------+------------------+--------+ ECA       98      13                                         +----------+--------+--------+--------+------------------+--------+ +----------+--------+-------+----------------+-------------------+           PSV cm/sEDV cmsDescribe        Arm Pressure (mmHG) +----------+--------+-------+----------------+-------------------+ [id number]            Multiphasic, WNL                    +----------+--------+-------+----------------+-------------------+ +---------+--------+--+--------+--+---------+ VertebralPSV cm/s71EDV cm/s16Antegrade +---------+--------+--+--------+--+---------+  Left Carotid Findings: +----------+--------+--------+--------+----------------------------+--------+           PSV cm/sEDV cm/sStenosisPlaque Description          Comments +----------+--------+--------+--------+----------------------------+--------+ CCA Prox  99      16                                                   +----------+--------+--------+--------+----------------------------+--------+  CCA Mid   153     22      <50%    hyperechoic and heterogenous         +----------+--------+--------+--------+----------------------------+--------+ CCA Distal80      15                                                   +----------+--------+--------+--------+----------------------------+--------+ ICA Prox  79      11      1-39%   calcific                             +----------+--------+--------+--------+----------------------------+--------+ ICA Distal93      27                                                   +----------+--------+--------+--------+----------------------------+--------+ ECA       68                                                           +----------+--------+--------+--------+----------------------------+--------+  +----------+--------+--------+----------------+-------------------+           PSV cm/sEDV cm/sDescribe        Arm Pressure (mmHG) +----------+--------+--------+----------------+-------------------+ [id number]             Multiphasic, WNL                    +----------+--------+--------+----------------+-------------------+ +---------+--------+--+--------+--+---------+ VertebralPSV cm/s45EDV cm/s23Antegrade +---------+--------+--+--------+--+---------+   Summary: Right Carotid: Velocities in the right ICA are consistent with a 1-39% stenosis. Left Carotid: Velocities in the left ICA are consistent with a 1-39% stenosis.               Non-hemodynamically significant plaque <50% noted in the CCA. Vertebrals:  Bilateral vertebral arteries demonstrate antegrade flow. Subclavians: Normal flow hemodynamics were seen in bilateral subclavian              arteries. *See table(s) above for measurements and observations.  Electronically signed by Antony Contras MD on 05/26/2020 at 4:29:08 PM.    Final    VAS [place] LOWER EXTREMITY VENOUS (DVT)  Result Date: 05/28/2020  Lower Venous DVT Study Patient Name:  HOBY KAWAI  Date of Exam:   05/28/2020 Medical Rec #: [id number]         Accession #:    [id number] Date of Birth: [birth year]          Patient Gender: M Patient Age:   80Y Exam Location:  [facility] Procedure:      VAS [place] LOWER EXTREMITY VENOUS (DVT) Referring Phys: [id number] [place] --------------------------------------------------------------------------------  Indications: Swelling.  Comparison Study: No previous exams Performing Technologist: Rogelia Rohrer  Examination Guidelines: A complete evaluation includes B-mode imaging, spectral Doppler, color Doppler, and power Doppler as needed of all accessible portions of each vessel. Bilateral testing is considered an integral part of a complete examination. Limited examinations for reoccurring indications may be performed as noted. The reflux  portion of the exam is performed with the patient in  reverse Trendelenburg.  +---------+---------------+---------+-----------+----------+--------------+ RIGHT    CompressibilityPhasicitySpontaneityPropertiesThrombus Aging +---------+---------------+---------+-----------+----------+--------------+ CFV      Full           Yes      Yes                                 +---------+---------------+---------+-----------+----------+--------------+ SFJ      Full                                                        +---------+---------------+---------+-----------+----------+--------------+ FV Prox  Full           Yes      Yes                                 +---------+---------------+---------+-----------+----------+--------------+ FV Mid   Full           Yes      Yes                                 +---------+---------------+---------+-----------+----------+--------------+ FV DistalFull           Yes      Yes                                 +---------+---------------+---------+-----------+----------+--------------+ PFV      Full                                                        +---------+---------------+---------+-----------+----------+--------------+ POP      Full           Yes      Yes                                 +---------+---------------+---------+-----------+----------+--------------+ PTV      Full                                                        +---------+---------------+---------+-----------+----------+--------------+ PERO     Full                                                        +---------+---------------+---------+-----------+----------+--------------+   +---------+---------------+---------+-----------+----------+--------------+ LEFT     CompressibilityPhasicitySpontaneityPropertiesThrombus Aging +---------+---------------+---------+-----------+----------+--------------+ CFV      Full           Yes      Yes                                  +---------+---------------+---------+-----------+----------+--------------+  SFJ      Full                                                        +---------+---------------+---------+-----------+----------+--------------+ FV Prox  Full           Yes      Yes                                 +---------+---------------+---------+-----------+----------+--------------+ FV Mid   Full           Yes      Yes                                 +---------+---------------+---------+-----------+----------+--------------+ FV DistalFull           Yes      Yes                                 +---------+---------------+---------+-----------+----------+--------------+ PFV      Full                                                        +---------+---------------+---------+-----------+----------+--------------+ POP      Full           Yes      Yes                                 +---------+---------------+---------+-----------+----------+--------------+ PTV      Full                                                        +---------+---------------+---------+-----------+----------+--------------+ PERO     Full                                                        +---------+---------------+---------+-----------+----------+--------------+     Summary: BILATERAL: - No evidence of deep vein thrombosis seen in the lower extremities, bilaterally. - No evidence of superficial venous thrombosis in the lower extremities, bilaterally. -No evidence of popliteal cyst, bilaterally.   *See table(s) above for measurements and observations. Electronically signed by Deitra Mayo MD on 05/28/2020 at 8:13:57 PM.    Final     Assessment/Plan  1. Uncontrolled hypertension -  Continue Losartan and start HCTZ -  Monitor BPs - hydrochlorothiazide (MICROZIDE) 12.5 MG capsule; Take 1 capsule (12.5 mg total) by mouth daily.  Dispense: 30 capsule; Refill: 1  2.  Type 2 diabetes mellitus with neurological complications [facility]) Lab Results  Component Value Date   HGBA1C 7.5 (H) 05/26/2020   -  Julienne Kass Tyler Aas  in AM instead of HS -  Continue Novolog and Januvia -  Monitor CBGs    Family/ staff Communication:  Discussed plan of care with resident and charge nurse.  Labs/tests ordered:  None  Goals of care:   Short-term care   Durenda Age, DNP, MSN, FNP-BC [facility] [phone] (Monday-Friday 8:00 a.m. - 5:00 p.m.) [phone] (after hours)

## 2020-06-07 LAB — COMPREHENSIVE METABOLIC PANEL
Calcium: 9.4 (ref 8.7–10.7)
GFR calc Af Amer: 57.29
GFR calc non Af Amer: 49.43

## 2020-06-07 LAB — BASIC METABOLIC PANEL
BUN: 22 — AB (ref 4–21)
CO2: 28 — AB (ref 13–22)
Chloride: 100 (ref 99–108)
Creatinine: 1.4 — AB (ref 0.6–1.3)
Glucose: 57
Potassium: 4.5 (ref 3.4–5.3)
Sodium: 138 (ref 137–147)

## 2020-06-07 LAB — CBC AND DIFFERENTIAL
HCT: 37 — AB (ref 41–53)
Hemoglobin: 12.4 — AB (ref 13.5–17.5)
Platelets: 323 (ref 150–399)
WBC: 5.1

## 2020-06-07 LAB — CBC: RBC: 4.03 (ref 3.87–5.11)

## 2020-06-08 ENCOUNTER — Ambulatory Visit: Payer: Medicare Other

## 2020-06-08 ENCOUNTER — Other Ambulatory Visit: Payer: Self-pay | Admitting: Adult Health

## 2020-06-08 MED ORDER — OXYCODONE HCL 5 MG PO TABS: 5.0000 mg | ORAL_TABLET | Freq: Four times a day (QID) | ORAL | 0 refills | Status: DC | PRN

## 2020-06-10 ENCOUNTER — Ambulatory Visit: Payer: Medicare Other

## 2020-06-11 ENCOUNTER — Non-Acute Institutional Stay (SKILLED_NURSING_FACILITY): Payer: Medicare Other | Admitting: Adult Health

## 2020-06-11 ENCOUNTER — Encounter: Payer: Self-pay | Admitting: Adult Health

## 2020-06-11 DIAGNOSIS — E1149 Type 2 diabetes mellitus with other diabetic neurological complication: Secondary | ICD-10-CM | POA: Diagnosis not present

## 2020-06-11 DIAGNOSIS — M5442 Lumbago with sciatica, left side: Secondary | ICD-10-CM

## 2020-06-11 DIAGNOSIS — G8929 Other chronic pain: Secondary | ICD-10-CM | POA: Diagnosis not present

## 2020-06-11 DIAGNOSIS — N390 Urinary tract infection, site not specified: Secondary | ICD-10-CM | POA: Diagnosis not present

## 2020-06-11 DIAGNOSIS — R296 Repeated falls: Secondary | ICD-10-CM

## 2020-06-11 DIAGNOSIS — I2510 Atherosclerotic heart disease of native coronary artery without angina pectoris: Secondary | ICD-10-CM

## 2020-06-11 DIAGNOSIS — I10 Essential (primary) hypertension: Secondary | ICD-10-CM

## 2020-06-11 NOTE — Progress Notes (Signed)
Location:  [facility] Room Number: 108-A Place of Service:  SNF (31) Provider:  Durenda Age, DNP, FNP-BC  Patient Care Team: Lawerance Cruel, MD as PCP - General (Family Medicine) Jettie Booze, MD as PCP - Cardiology (Cardiology) Lawerance Cruel, MD (Family Medicine)  Extended Emergency Contact Information Primary Emergency Contact: Pamelia Hoit [place] Mobile Phone: [phone] Relation: Daughter Secondary Emergency Contact: Ryan,Margaret  [place] Phone: [phone] Mobile Phone: [phone] Relation: Significant other  Code Status:  FULL CODE  Goals of care: Advanced Directive information Advanced Directives 06/11/2020  Does Patient Have a Medical Advance Directive? No  Type of Advance Directive -  Does patient want to make changes to medical advance directive? No - Patient declined  Copy of [place] in Chart? -  Would patient like information on creating a medical advance directive? -  Pre-existing out of facility DNR order (yellow form or pink MOST form) -     Chief Complaint  Patient presents with  . Discharge Note    Discharge from SNF    HPI:  Pt is a 80 y.o. male who is for discharge home today, 6/03/222, with Home health PT and OT.  He was admitted to [facility] on 05/31/20 post hospital admission 05/25/20 to 05/31/20 post fall at home and was unable to recollect what happened. Of note, he was seen in the ED 2 days prior to admission to the hospital and was diagnosed with UTI and was sent home on Keflex. CT pelvis negative for fracture and CT lumbar spine, CT cervical thoracic and lumbar spine, CT head no acute fracture or subluxation.  In cervical area, chronic height loss at L4, mild spinal canal stenosis at L4-5.  His pain was managed and was seen by neurosurgery.  No intervention was advised and for possible injection for pain  control.  Patient was admitted to this facility for short-term rehabilitation after the patient's recent hospitalization.  Patient has completed SNF rehabilitation and therapy has cleared the patient for discharge.   Past Medical History:  Diagnosis Date  . Abnormal prostate biopsy   . Anticoagulant long-term use    currently xarelto  . BPH with elevated PSA   . CKD (chronic kidney disease), stage II   . Complication of anesthesia    limted neck rom limited use of left arm due to cva  . Coronary artery disease    CARDIOLOGIST-  DR Irish Lack--  2010-- PCI w/ stenting midLAD  . DDD (degenerative disc disease), lumbar   . Degeneration of cervical intervertebral disc   . Depression   . Diabetes mellitus without complication ([place])   . Dyspnea on exertion   . GERD (gastroesophageal reflux disease)   . Hemiparesis due to cerebral infarction   . History of cerebrovascular accident (CVA) with residual deficit 2002 and 2003--  hemiparisis both sides   per MRI  anterior left frontal lobe, left para midline pons, and inferior cerebullam bilaterally infarcts  . History of pulmonary embolus (PE)    06-30-2012  extensive bilaterally  . History of recurrent TIAs   . History of syncope    hx multiple pre-syncope and syncopal episodes due to vasovagal, orthostatic hypotension, dehydration  . History of TIAs    several since 2002  . Hyperlipidemia   . Hypertension   . Mild atherosclerosis of carotid artery, bilateral    per last duplex 11-04-2014  bilateral ICA 1--39%  .  Neuropathy    fingers  . OSA on CPAP    followed by dr dohmeier--  sev. osa w/ AHI 65.9  . Prostate cancer ([place]) dx 2018  . Renal insufficiency   . S/P coronary artery stent placement 2010   stenting to mid LAD  . Simple renal cyst    bilaterally  . Stroke ([place])   . Trigger finger of both hands 11-17-13  . Type 2 diabetes mellitus ([place]) dx 1986   last one A1c 9.2 on 04-26-2016  . Unsteady gait    . Hx prior CVA/TIAs;   . Vertebral artery occlusion, left    chronic   Past Surgical History:  Procedure Laterality Date  . ANTERIOR CERVICAL DECOMP/DISCECTOMY FUSION  2004   C3 -- C6 limited rom  . CARDIAC CATHETERIZATION  06-10-2010   dr Irish Lack   wide patent LAD stent, mid lesion at the origin of the septal prior to the previous stent 40-50%/  normal LVF, ef 55%  . CARDIOVASCULAR STRESS TEST  10-23-2012  dr Irish Lack   normal nuclear perfusion study w/ no ischemia/  normal LV function and wall motion , ef 65%  . CARPAL TUNNEL RELEASE Bilateral   . CATARACT EXTRACTION W/ INTRAOCULAR LENS  IMPLANT, BILATERAL    . CHOLECYSTECTOMY N/A 11/02/2015   Procedure: LAPAROSCOPIC CHOLECYSTECTOMY WITH INTRAOPERATIVE CHOLANGIOGRAM;  Surgeon: Donnie Mesa, MD;  Location: [facility];  Service: General;  Laterality: N/A;  . COLONOSCOPY    . CORONARY ANGIOPLASTY WITH STENT PLACEMENT  02/2008   stenting to mid LAD  . GOLD SEED IMPLANT N/A 11/15/2016   Procedure: GOLD SEED IMPLANT TIMES THREE;  Surgeon: Ardis Hughs, MD;  Location: [facility];  Service: Urology;  Laterality: N/A;  . IR ANGIO INTRA EXTRACRAN SEL COM CAROTID INNOMINATE BILAT MOD SED  06/13/2018  . IR ANGIO VERTEBRAL SEL VERTEBRAL UNI R MOD SED  06/13/2018  . IR US GUIDE VASC ACCESS RIGHT  06/13/2018  . LEFT HEART CATH AND CORONARY ANGIOGRAPHY N/A 05/25/2017   Procedure: LEFT HEART CATH AND CORONARY ANGIOGRAPHY;  Surgeon: Jettie Booze, MD;  Location: [facility];  Service: Cardiovascular;  Laterality: N/A;  . LEFT HEART CATHETERIZATION WITH CORONARY ANGIOGRAM N/A 04/03/2013   Procedure: LEFT HEART CATHETERIZATION WITH CORONARY ANGIOGRAM;  Surgeon: Jettie Booze, MD;  Location: [facility] CATH LAB;  Service: Cardiovascular;  Laterality: N/A;  patent mLAD stent  w/ mild disease in remainder LAD and its branches;  mod. focal lesion midLCFx- FFR of lesion was negative for ischemia/  normal LVSF, ef 50%  . lungs  2005   "fluid pumped off lungs"   . NEUROPLASTY / TRANSPOSITION ULNAR NERVE AT ELBOW Right 2004  . PROSTATE BIOPSY N/A 08/31/2016   Procedure: PROSTATE  BIOPSY TRANSRECTAL ULTRASONIC PROSTATE (TUBP);  Surgeon: Ardis Hughs, MD;  Location: [facility];  Service: Urology;  Laterality: N/A;  . SPACE OAR INSTILLATION N/A 11/15/2016   Procedure: SPACE OAR INSTILLATION;  Surgeon: Ardis Hughs, MD;  Location: [facility];  Service: Urology;  Laterality: N/A;  . TRANSTHORACIC ECHOCARDIOGRAM  04/27/2016   severe focal basal LVH, ef 60-65%,  grade 2 diastoilc dysfunction/  mild AR, MR, and TR/  atrial septum lipomatous hypertrophy/  PASP 17mmHg  . UMBILICAL HERNIA REPAIR      Allergies  Allergen Reactions  . Other Other (See Comments)    Per patient- cardiac cath dye-  "woke up during procedure hysterical."  . Phenergan [Promethazine] Other (  See Comments)    Mood changes   . Iohexol Other (See Comments)    Patient refuses contrast after having a hysterical event in hospital //r ls spoke with patient   . Iohexol   . Phenergan [Promethazine]     Other reaction(s): syncope  . Tramadol     Outpatient Encounter Medications as of 06/11/2020  Medication Sig  . acetaminophen (TYLENOL) 500 MG tablet Take 500 mg by mouth every 6 (six) hours as needed (pain).  . bisacodyl (DULCOLAX) 10 MG suppository Place 1 suppository (10 mg total) rectally daily as needed for moderate constipation.  . diclofenac Sodium (VOLTAREN) 1 % GEL Apply 4 g topically 4 (four) times daily.  . fluticasone (FLONASE) 50 MCG/ACT nasal spray Place 1 spray into both nostrils daily as needed for allergies.  . furosemide (LASIX) 20 MG tablet Take 20-40 mg by mouth daily as needed for fluid.  . hydrochlorothiazide (MICROZIDE) 12.5 MG capsule Take 1 capsule (12.5 mg total) by mouth daily.  . insulin aspart (NOVOLOG) 100 UNIT/ML FlexPen Inject 5 Units into the skin 3 (three) times daily with meals.  . lidocaine (LIDODERM) 5 % Place  1 patch onto the skin daily. Remove & Discard patch within 12 hours or as directed by MD  . losartan (COZAAR) 100 MG tablet Take 100 mg by mouth daily.  . methocarbamol (ROBAXIN) 500 MG tablet Take 1 tablet (500 mg total) by mouth every 8 (eight) hours as needed for muscle spasms.  Marland Kitchen oxyCODONE (OXY IR/ROXICODONE) 5 MG immediate release tablet Take 1 tablet (5 mg total) by mouth every 6 (six) hours as needed for up to 6 doses for severe pain.  . pantoprazole (PROTONIX) 40 MG tablet Take 40 mg by mouth every morning.  . polyethylene glycol (MIRALAX / GLYCOLAX) 17 g packet Take 17 g by mouth daily.  . pregabalin (LYRICA) 300 MG capsule Take 300 mg by mouth 2 (two) times daily.  . rosuvastatin (CRESTOR) 10 MG tablet Take 10 mg by mouth daily.  . sitaGLIPtin (JANUVIA) 50 MG tablet Take 50 mg by mouth daily.  Tyler Aas FLEXTOUCH 100 UNIT/ML FlexTouch Pen Inject 34 Units into the skin daily.  Alveda Reasons 20 MG TABS tablet Take 20 mg by mouth daily.  . [DISCONTINUED] NOVOLOG FLEXPEN 100 UNIT/ML FlexPen Inject 0-20 Units into the skin See admin instructions. Tid with meals per sliding scale   No facility-administered encounter medications on file as of 06/11/2020.    Review of Systems  GENERAL: No change in appetite, no fatigue, no weight changes, no fever, chills or weakness MOUTH and THROAT: Denies oral discomfort, gingival pain or bleeding RESPIRATORY: no cough, SOB, DOE, wheezing, hemoptysis CARDIAC: No chest pain, edema or palpitations GI: No abdominal pain, diarrhea, constipation, heart burn, nausea or vomiting GU: Denies dysuria, frequency, hematuria, incontinence, or discharge NEUROLOGICAL: Denies dizziness, syncope, numbness, or headache PSYCHIATRIC: Denies feelings of depression or anxiety. No report of hallucinations, insomnia, paranoia, or agitation   Immunization History  Administered Date(s) Administered  . Influenza, High Dose Seasonal PF 09/24/2017, 08/19/2018  . Zoster Recombinat  (Shingrix) 10/05/2017   Pertinent  Health Maintenance Due  Topic Date Due  . FOOT EXAM  Never done  . OPHTHALMOLOGY EXAM  Never done  . PNA vac Low Risk Adult (1 of 2 - PCV13) Never done  . INFLUENZA VACCINE  08/09/2020  . HEMOGLOBIN A1C  11/26/2020   Fall Risk  09/09/2019 08/01/2019 04/28/2019 03/04/2019 02/10/2019  Falls in the past year? 1 1  0 0 0  Number falls in past yr: 0 0 0 0 0  Injury with Fall? 1 0 0 0 0  Comment muscle pain - - - -  Risk for fall due to : History of fall(s) Impaired mobility Impaired mobility Other (Comment);Impaired mobility Other (Comment)  Risk for fall due to: Comment - - - reports lightheadedness at times has been experiencing lightheadedness  Follow up Falls evaluation completed;Falls prevention discussed Falls prevention discussed;Education provided;Falls evaluation completed Falls prevention discussed;Education provided;Falls evaluation completed Falls prevention discussed;Education provided;Falls evaluation completed Falls prevention discussed;Education provided;Falls evaluation completed     Vitals:   06/11/20 1052  BP: 132/74  Pulse: 70  Resp: 18  Temp: (!) 97.5 F (36.4 C)  Weight: 203 lb 9.6 oz (92.4 kg)  Height: 5\' 5"  (1.651 m)   Body mass index is 33.88 kg/m.  Physical Exam  GENERAL APPEARANCE: Well nourished. In no acute distress. Obese SKIN:  Skin is warm and dry.  MOUTH and THROAT: Lips are without lesions. Oral mucosa is moist and without lesions.  RESPIRATORY: Breathing is even & unlabored, BS CTAB CARDIAC: RRR, no murmur,no extra heart sounds, no edema GI: Abdomen soft, normal BS, no masses, no tenderness NEUROLOGICAL: There is no tremor. Speech is clear. Alert and oriented X 3. PSYCHIATRIC:  Affect and behavior are appropriate   Labs reviewed: Recent Labs    08/12/19 0424 08/13/19 0110 08/14/19 0307 05/25/20 2032 05/26/20 0728 05/27/20 0143 06/07/20 0000  NA 139 137   < > 134* 136 133* 138  K 3.8 4.4   < > 4.3 4.2  4.0 4.5  CL 99 96*   < > 100 104 101 100  CO2 30 30   < > 25 22 24  28*  GLUCOSE 176* 196*   < > 358* 251* 168*  --   BUN 16 30*   < > 20 17 20  22*  CREATININE 1.39* 1.95*   < > 1.45* 1.17 1.18 1.4*  CALCIUM 9.1 9.1   < > 9.2 8.6* 8.5* 9.4  MG 1.7  --   --   --   --   --   --   PHOS 4.0 4.8*  --   --   --   --   --    < > = values in this interval not displayed.   Recent Labs    05/24/20 1500 05/25/20 2032 05/26/20 0728 05/27/20 0143  AST 31 25  --  23  22  ALT 25 25  --  19  19  ALKPHOS 72 65  --  48  47  BILITOT 0.8 1.7* 1.1 1.0  0.8  PROT 8.0 7.5  --  6.5  6.2*  ALBUMIN 4.1 3.7  --  2.9*  2.9*   Recent Labs    08/13/19 0110 05/24/20 1500 05/25/20 2032 05/27/20 0143 05/28/20 0325 05/29/20 0156 06/07/20 0000  WBC 5.0 7.7 8.8 8.3 5.9 5.8 5.1  NEUTROABS 2.3 4.3 6.6  --   --   --   --   HGB 12.9* 14.5 13.9 12.7* 12.0* 12.0* 12.4*  HCT 38.0* 42.9 41.4 37.1* 35.3* 35.5* 37*  MCV 93.1 92.9 93.9 92.5 91.9 91.7  --   PLT 222 185 162 160 169 168 323   Lab Results  Component Value Date   TSH 1.731 05/26/2020   Lab Results  Component Value Date   HGBA1C 7.5 (H) 05/26/2020   Lab Results  Component Value Date   CHOL 139 11/24/2019  HDL 53 11/24/2019   LDLCALC 72 11/24/2019   TRIG 70 11/24/2019   CHOLHDL 2.6 11/24/2019    Significant Diagnostic Results in last 30 days:  CT ABDOMEN PELVIS WO CONTRAST  Result Date: 05/26/2020 CLINICAL DATA:  Abdominal distension EXAM: CT ABDOMEN AND PELVIS WITHOUT CONTRAST TECHNIQUE: Multidetector CT imaging of the abdomen and pelvis was performed following the standard protocol without IV contrast. COMPARISON:  CT renal colic 40/97/3532 FINDINGS: Lower chest: Mild cardiomegaly. Three-vessel coronary artery atherosclerosis. Distal thoracic aortic atherosclerosis. Small hiatal hernia and hyperdense contrast media present within the distal thoracic esophagus. Bibasilar atelectasis with additional bandlike areas of scarring or  subsegmental atelectasis. No visible effusion. Hepatobiliary: No visible focal liver lesion within limitations of an unenhanced CT. Normal hepatic attenuation. Smooth surface contour. Prior cholecystectomy. No visible calcified gallstones or significant biliary ductal dilatation. Pancreas: Partial fatty replacement of the pancreas. No pancreatic ductal dilatation or surrounding inflammatory changes. Spleen: Normal in size. No concerning splenic lesions. Adrenals/Urinary Tract: Normal adrenal glands. Bilateral renal cortical atrophy. Increased bilateral perinephric stranding when compared to recent imaging performed 05/24/2020. Few fluid attenuation cysts are again noted. No concerning visible or contour deforming renal lesions. No urolithiasis or hydronephrosis. Left posterolateral bladder diverticulum is noted, some hazy stranding is noted adjacent this diverticulum, not as clearly evident on comparison. The indentation of bladder base by an enlarged prostate. Stomach/Bowel: Small hiatal hernia and high attenuation contrast media retained within the distal thoracic esophagus. Contrast media has traversed to the level of the transverse colon. No evidence of bowel obstruction. No conspicuous large or small thickening or dilatation. Contrast media opacifies a normal caliber noninflamed appendix in the right lower quadrant as well. Vascular/Lymphatic: Atherosclerotic calcifications within the abdominal aorta and branch vessels. No aneurysm or ectasia. No enlarged abdominopelvic lymph nodes. Reproductive: The prostate and seminal vesicles are unremarkable. Multiple calcified phleboliths in the pelvis and along the spermatic cords. Other: No abdominopelvic free air or fluid no bowel containing hernia. Abdominal wall laxity is similar to prior. Musculoskeletal: Multilevel degenerative changes are present in the imaged portions of the spine. Additional degenerative changes in both hips and pelvis. Partial fusion across the  SI joints. No concerning lytic or blastic lesions or other acute osseous abnormalities. IMPRESSION: 1. No evidence of high-grade bowel obstruction or other acute bowel abnormality. Abdominal wall laxity is not significantly changed from comparison. The 2. Increasing bilateral perinephric stranding as well as some hazy stranding adjacent a previously identified bladder diverticula. Findings are concerning for possible ascending urinary tract infection. Background bilateral renal atrophy/scarring. 3. Indentation of the bladder base by an enlarged prostate could reflect some chronic outlet obstruction correlate with clinical symptoms. 4. Small hiatal hernia with high attenuation contrast media retained or refluxed into the distal thoracic esophagus, correlate for clinical symptoms. 5. Borderline cardiomegaly.  Coronary artery atherosclerosis. 6.  Aortic Atherosclerosis (ICD10-I70.0). 7. Mild body wall edema, correlate with fluid status. Electronically Signed   By: Lovena Le M.D.   On: 05/26/2020 21:59   DG Abd 1 View  Result Date: 05/26/2020 CLINICAL DATA:  80 year old male with abdominal distension. EXAM: ABDOMEN - 1 VIEW COMPARISON:  CT Abdomen and Pelvis 05/24/2020. FINDINGS: AP view at 0635 hours. Non obstructed bowel gas pattern. Stable cholecystectomy clips. Partially visible surgical clips about the prostate. Pelvic phleboliths. No acute osseous abnormality identified. IMPRESSION: Non obstructed bowel gas pattern. Electronically Signed   By: Genevie Ann M.D.   On: 05/26/2020 07:04   CT Head Wo Contrast  Result Date: 05/25/2020 CLINICAL DATA:  Status post fall. EXAM: CT HEAD WITHOUT CONTRAST TECHNIQUE: Contiguous axial images were obtained from the base of the skull through the vertex without intravenous contrast. COMPARISON:  September 03, 2019 FINDINGS: Brain: There is mild cerebral atrophy with widening of the extra-axial spaces and ventricular dilatation. There are areas of decreased attenuation within  the white matter tracts of the supratentorial brain, consistent with microvascular disease changes. Vascular: No hyperdense vessel or unexpected calcification. Skull: Normal. Negative for fracture or focal lesion. Sinuses/Orbits: There is stable marked severity posterior left ethmoid sinus and sphenoid sinus mucosal thickening. Other: A 2.8 cm x 0.6 cm left frontal scalp lipoma is noted (approximately -108.63 Hounsfield units). IMPRESSION: 1. Generalized cerebral atrophy. 2. No acute intracranial abnormality. 3. Chronic left ethmoid sinus and sphenoid sinus disease. Electronically Signed   By: Virgina Norfolk M.D.   On: 05/25/2020 21:26   CT Cervical Spine Wo Contrast  Result Date: 05/25/2020 CLINICAL DATA:  Status post fall. EXAM: CT CERVICAL SPINE WITHOUT CONTRAST TECHNIQUE: Multidetector CT imaging of the cervical spine was performed without intravenous contrast. Multiplanar CT image reconstructions were also generated. COMPARISON:  September 03, 2019 FINDINGS: Alignment: Normal. Skull base and vertebrae: No acute fracture. A radiopaque fixation plate and fixation screws are seen along the anterior aspect of the C3, C4, C5, C6 and C7 levels. Arthrodesis is also seen throughout the fused levels. These areas are unchanged in appearance and position. Associated streak artifact is noted with subsequently limited evaluation of the adjacent osseous and soft tissue structures. Soft tissues and spinal canal: No prevertebral fluid or swelling. No visible canal hematoma. Disc levels: Stable moderate to marked severity endplate sclerosis and associated intervertebral disc space narrowing is noted at the levels of C6-C7 and C7-T1. Bilateral moderate severity multilevel facet joint hypertrophy is noted. Upper chest: Negative. Other: None. IMPRESSION: 1. Stable extensive postoperative changes consistent with C3 through C7 fusion with corpectomy. 2. No acute cervical spine fracture. 3. Stable degenerative changes at the  levels of C6-C7 and C7-T1. Electronically Signed   By: Virgina Norfolk M.D.   On: 05/25/2020 21:31   CT Thoracic Spine Wo Contrast  Result Date: 05/25/2020 CLINICAL DATA:  Fall EXAM: CT THORACIC AND LUMBAR SPINE WITHOUT CONTRAST TECHNIQUE: Multidetector CT imaging of the thoracic and lumbar spine was performed without contrast. Multiplanar CT image reconstructions were also generated. COMPARISON:  Lumbar spine CT 09/03/2019 FINDINGS: CT THORACIC SPINE FINDINGS Alignment: Normal. Vertebrae: No acute fracture or focal pathologic process. Paraspinal and other soft tissues: Calcific aortic atherosclerosis. Disc levels: No bony spinal canal stenosis. CT LUMBAR SPINE FINDINGS Segmentation: 5 lumbar type vertebrae. Alignment: Normal. Vertebrae: No acute fracture or focal pathologic process. Chronic height loss at L4 is unchanged. Paraspinal and other soft tissues: Negative Disc levels: Mild spinal canal stenosis at L4-5. IMPRESSION: 1. No acute fracture or static subluxation of the thoracic or lumbar spine. 2. Chronic height loss at L4, unchanged. 3. Mild spinal canal stenosis at L4-5. Aortic Atherosclerosis (ICD10-I70.0). Electronically Signed   By: Ulyses Jarred M.D.   On: 05/25/2020 21:51   CT Lumbar Spine Wo Contrast  Result Date: 05/25/2020 CLINICAL DATA:  Fall EXAM: CT THORACIC AND LUMBAR SPINE WITHOUT CONTRAST TECHNIQUE: Multidetector CT imaging of the thoracic and lumbar spine was performed without contrast. Multiplanar CT image reconstructions were also generated. COMPARISON:  Lumbar spine CT 09/03/2019 FINDINGS: CT THORACIC SPINE FINDINGS Alignment: Normal. Vertebrae: No acute fracture or focal pathologic process. Paraspinal and other soft tissues:  Calcific aortic atherosclerosis. Disc levels: No bony spinal canal stenosis. CT LUMBAR SPINE FINDINGS Segmentation: 5 lumbar type vertebrae. Alignment: Normal. Vertebrae: No acute fracture or focal pathologic process. Chronic height loss at L4 is unchanged.  Paraspinal and other soft tissues: Negative Disc levels: Mild spinal canal stenosis at L4-5. IMPRESSION: 1. No acute fracture or static subluxation of the thoracic or lumbar spine. 2. Chronic height loss at L4, unchanged. 3. Mild spinal canal stenosis at L4-5. Aortic Atherosclerosis (ICD10-I70.0). Electronically Signed   By: Ulyses Jarred M.D.   On: 05/25/2020 21:51   CT PELVIS WO CONTRAST  Result Date: 05/25/2020 CLINICAL DATA:  Findings suspicious for pubic symphysis fracture on the left on recent plain film EXAM: CT PELVIS WITHOUT CONTRAST TECHNIQUE: Multidetector CT imaging of the pelvis was performed following the standard protocol without intravenous contrast. COMPARISON:  Plain film from earlier in the same day. FINDINGS: Urinary Tract: Bladder is well distended. 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 Bowel: Visualized bowel is unremarkable. The appendix is within normal limits. Vascular/Lymphatic: Vascular calcifications are seen. No significant lymphadenopathy is noted. Multiple phleboliths are noted within the scrotum. Reproductive:  Prostate is within normal limits. Other:  None. Musculoskeletal: Bony structures show no acute fracture. The areas of abnormality in the pubic symphysis were artifactual related to summation of shadows. Degenerative changes of lumbar spine and hip joints are seen. IMPRESSION: No acute fracture noted. 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 No other focal abnormality is seen. Electronically Signed   By: Inez Catalina M.D.   On: 05/25/2020 22:38   DG Chest Portable 1 View  Result Date: 05/25/2020 CLINICAL DATA:  Recent fall with chest pain, initial encounter EXAM: PORTABLE CHEST 1 VIEW COMPARISON:  09/03/2019 FINDINGS: Cardiac shadow is enlarged but accentuated by the frontal technique. Aortic calcifications are again noted. Lungs are hypoaerated bilaterally without focal infiltrate or sizable effusion. No acute bony abnormality is noted. IMPRESSION:  Poor inspiratory effort.  No acute abnormality noted. Electronically Signed   By: Inez Catalina M.D.   On: 05/25/2020 20:53   EEG adult  Result Date: 05/26/2020 Lora Havens, MD     05/26/2020 11:47 AM Patient Name: Steve Brown MRN: [id number] Epilepsy Attending: Lora Havens Referring Physician/Provider: Dr Derrick Ravel Date: 05/26/2020 Duration: 25 mins Patient history: 80yo M with ams, syncope. EEG to evaluate for seizure. Level of alertness: Awake, asleep AEDs during EEG study: Pregabalin Technical aspects: This EEG study was done with scalp electrodes positioned according to the 10-20 International system of electrode placement. Electrical activity was acquired at a sampling rate of 500Hz  and reviewed with a high frequency filter of 70Hz  and a low frequency filter of 1Hz . EEG data were recorded continuously and digitally stored. Description: The posterior dominant rhythm consists of 8-9 Hz activity of moderate voltage (25-35 uV) seen predominantly in posterior head regions, symmetric and reactive to eye opening and eye closing. Sleep was characterized by vertex waves, sleep spindles (12 to 14 Hz), maximal frontocentral region. Hyperventilation and photic stimulation were not performed.   IMPRESSION: This study is within normal limits. No seizures or epileptiform discharges were seen throughout the recording. Lora Havens   ECHOCARDIOGRAM COMPLETE  Result Date: 05/26/2020    ECHOCARDIOGRAM REPORT   Patient Name:   DAQUAN CRAPPS Date of Exam: 05/26/2020 Medical Rec #:  [id number]        Height:       65.0 in Accession #:    [id number]  Weight:       215.0 lb Date of Birth:  17-Oct-1940         BSA:          2.040 m Patient Age:    64 years         BP:           151/80 mmHg Patient Gender: M                HR:           66 bpm. Exam Location:  Inpatient Procedure: 2D Echo, Cardiac Doppler and Color Doppler Indications:    Syncope  History:        Patient has no prior history of  Echocardiogram examinations.                 CAD, Stroke and COPD, Signs/Symptoms:Syncope; Risk                 Factors:Diabetes, Hypertension, Dyslipidemia and Sleep Apnea.                 CKD, pul.embolus.  Sonographer:    Dustin Flock Referring Phys: [id number] [place]  1. Left ventricular ejection fraction, by estimation, is 60 to 65%. The left ventricle has normal function. The left ventricle has no regional wall motion abnormalities. Left ventricular diastolic parameters are consistent with Grade I diastolic dysfunction (impaired relaxation).  2. Right ventricular systolic function is normal. The right ventricular size is normal. There is mildly elevated pulmonary artery systolic pressure.  3. Left atrial size was mildly dilated.  4. Right atrial size was mildly dilated.  5. The mitral valve is normal in structure. No evidence of mitral valve regurgitation. No evidence of mitral stenosis.  6. The aortic valve is normal in structure. There is mild calcification of the aortic valve. Aortic valve regurgitation is trivial. No aortic stenosis is present.  7. The inferior vena cava is normal in size with greater than 50% respiratory variability, suggesting right atrial pressure of 3 mmHg. FINDINGS  Left Ventricle: Left ventricular ejection fraction, by estimation, is 60 to 65%. The left ventricle has normal function. The left ventricle has no regional wall motion abnormalities. The left ventricular internal cavity size was normal in size. There is  no left ventricular hypertrophy. Left ventricular diastolic parameters are consistent with Grade I diastolic dysfunction (impaired relaxation). Right Ventricle: The right ventricular size is normal. No increase in right ventricular wall thickness. Right ventricular systolic function is normal. There is mildly elevated pulmonary artery systolic pressure. The tricuspid regurgitant velocity is 2.93  m/s, and with an assumed right atrial pressure of 3  mmHg, the estimated right ventricular systolic pressure is 93.7 mmHg. Left Atrium: Left atrial size was mildly dilated. Right Atrium: Right atrial size was mildly dilated. Pericardium: There is no evidence of pericardial effusion. Mitral Valve: The mitral valve is normal in structure. No evidence of mitral valve regurgitation. No evidence of mitral valve stenosis. Tricuspid Valve: The tricuspid valve is normal in structure. Tricuspid valve regurgitation is trivial. No evidence of tricuspid stenosis. Aortic Valve: The aortic valve is normal in structure. There is mild calcification of the aortic valve. Aortic valve regurgitation is trivial. No aortic stenosis is present. Pulmonic Valve: The pulmonic valve was normal in structure. Pulmonic valve regurgitation is trivial. No evidence of pulmonic stenosis. Aorta: The aortic root is normal in size and structure. Venous: The inferior vena cava is normal in size with greater than 50%  respiratory variability, suggesting right atrial pressure of 3 mmHg. IAS/Shunts: No atrial level shunt detected by color flow Doppler.  LEFT VENTRICLE PLAX 2D LVIDd:         4.10 cm  Diastology LVIDs:         2.80 cm  LV e' medial:    4.57 cm/s LV PW:         1.50 cm  LV E/e' medial:  11.2 LV IVS:        1.60 cm  LV e' lateral:   4.24 cm/s LVOT diam:     2.20 cm  LV E/e' lateral: 12.0 LV SV:         62 LV SV Index:   31 LVOT Area:     3.80 cm  RIGHT VENTRICLE RV Basal diam:  3.20 cm RV S prime:     5.98 cm/s TAPSE (M-mode): 2.2 cm LEFT ATRIUM             Index       RIGHT ATRIUM           Index LA diam:        3.90 cm 1.91 cm/m  RA Area:     20.00 cm LA Vol (A2C):   59.7 ml 29.27 ml/m RA Volume:   53.80 ml  26.37 ml/m LA Vol (A4C):   30.9 ml 15.15 ml/m LA Biplane Vol: 47.5 ml 23.29 ml/m  AORTIC VALVE LVOT Vmax:   80.90 cm/s LVOT Vmean:  54.100 cm/s LVOT VTI:    0.164 m  AORTA Ao Root diam: 3.20 cm MITRAL VALVE               TRICUSPID VALVE MV Area (PHT): 3.12 cm    TR Peak grad:   34.3  mmHg MV Decel Time: 243 msec    TR Vmax:        293.00 cm/s MV E velocity: 51.00 cm/s MV A velocity: 75.40 cm/s  SHUNTS MV E/A ratio:  0.68        Systemic VTI:  0.16 m                            Systemic Diam: 2.20 cm Glori Bickers MD Electronically signed by Glori Bickers MD Signature Date/Time: 05/26/2020/2:40:51 PM    Final    CT Renal Stone Study  Result Date: 05/24/2020 CLINICAL DATA:  80 year old male with right-sided low back and flank pain. Suspected nephrolithiasis. EXAM: CT ABDOMEN AND PELVIS WITHOUT CONTRAST TECHNIQUE: Multidetector CT imaging of the abdomen and pelvis was performed following the standard protocol without IV contrast. COMPARISON:  12/26/2019 FINDINGS: Lower chest: No acute abnormality. Hepatobiliary: The liver is normal in size, contour, and attenuation. Unchanged 1.4 cm hypoattenuating mass about the subcapsular segment 8, again favored to likely represent benign hemangioma. The gallbladder surgically absent. No intra or extrahepatic biliary ductal dilation. Pancreas: Unremarkable. No pancreatic ductal dilatation or surrounding inflammatory changes. Spleen: Normal in size without focal abnormality. Adrenals/Urinary Tract: Adrenal glands are unremarkable. Left interpolar simple cortical cyst measuring up to 16 mm. Kidneys are normal, without renal calculi, focal lesion, or hydronephrosis. No bladder wall thickening. Unchanged left bladder wall diverticulum. Stomach/Bowel: Stomach is within normal limits. Appendix appears normal. No evidence of bowel wall thickening, distention, or inflammatory changes. Vascular/Lymphatic: Aortic atherosclerosis. No enlarged abdominal or pelvic lymph nodes. Reproductive: Prostate is unremarkable. Right greater than left bilateral hydrocele. Other: No abdominal wall hernia or abnormality. No abdominopelvic ascites.  Musculoskeletal: Multilevel degenerative changes of the thoracolumbar spine. No acute osseous abnormality or aggressive appearing  osseous lesion. IMPRESSION: No acute abdominopelvic abnormality, nephrolithiasis, or hydronephrosis. Electronically Signed   By: Ruthann Cancer MD   On: 05/24/2020 14:58   DG Hip [place] W or Wo Pelvis 1 View Left  Result Date: 05/25/2020 CLINICAL DATA:  Recent fall with hip pain, initial encounter EXAM: DG HIP (WITH OR WITHOUT PELVIS) 3V PORT LEFT COMPARISON:  None. FINDINGS: There is lucency identified in the pubic symphysis on the left suspicious for undisplaced fracture. Proximal femur appears within normal limits. No soft tissue abnormality is noted. IMPRESSION: Lucency within the pubic symphysis on the left suspicious for undisplaced fracture. Electronically Signed   By: Inez Catalina M.D.   On: 05/25/2020 20:58   VAS US CAROTID  Result Date: 05/26/2020 Carotid Arterial Duplex Study Patient Name:  JAQWON MANFRED  Date of Exam:   05/26/2020 Medical Rec #: [id number]         Accession #:    [id number] Date of Birth: 27-Apr-1940          Patient Gender: M Patient Age:   80Y Exam [facility] Procedure:      VAS US CAROTID Referring Phys: [id number] VASUNDHRA RATHORE --------------------------------------------------------------------------------  Indications:      Syncope. Risk Factors:     Hypertension, hyperlipidemia, Diabetes, coronary artery                   disease. Comparison Study: No prior studies. Performing Technologist: Darlin Coco RDMS,RVT  Examination Guidelines: A complete evaluation includes B-mode imaging, spectral Doppler, color Doppler, and power Doppler as needed of all accessible portions of each vessel. Bilateral testing is considered an integral part of a complete examination. Limited examinations for reoccurring indications may be performed as noted.  Right Carotid Findings: +----------+--------+--------+--------+------------------+--------+           PSV cm/sEDV cm/sStenosisPlaque DescriptionComments  +----------+--------+--------+--------+------------------+--------+ CCA Prox  74      12                                         +----------+--------+--------+--------+------------------+--------+ CCA Distal85      17              calcific                   +----------+--------+--------+--------+------------------+--------+ ICA Prox  90      18      1-39%   calcific                   +----------+--------+--------+--------+------------------+--------+ ICA Distal83      24                                         +----------+--------+--------+--------+------------------+--------+ ECA       98      13                                         +----------+--------+--------+--------+------------------+--------+ +----------+--------+-------+----------------+-------------------+           PSV cm/sEDV cmsDescribe        Arm Pressure (mmHG) +----------+--------+-------+----------------+-------------------+ [id number]  Multiphasic, WNL                    +----------+--------+-------+----------------+-------------------+ +---------+--------+--+--------+--+---------+ VertebralPSV cm/s71EDV cm/s16Antegrade +---------+--------+--+--------+--+---------+  Left Carotid Findings: +----------+--------+--------+--------+----------------------------+--------+           PSV cm/sEDV cm/sStenosisPlaque Description          Comments +----------+--------+--------+--------+----------------------------+--------+ CCA Prox  99      16                                                   +----------+--------+--------+--------+----------------------------+--------+ CCA Mid   153     22      <50%    hyperechoic and heterogenous         +----------+--------+--------+--------+----------------------------+--------+ CCA Distal80      15                                                   +----------+--------+--------+--------+----------------------------+--------+  ICA Prox  79      11      1-39%   calcific                             +----------+--------+--------+--------+----------------------------+--------+ ICA Distal93      27                                                   +----------+--------+--------+--------+----------------------------+--------+ ECA       68                                                           +----------+--------+--------+--------+----------------------------+--------+ +----------+--------+--------+----------------+-------------------+           PSV cm/sEDV cm/sDescribe        Arm Pressure (mmHG) +----------+--------+--------+----------------+-------------------+ [id number]             Multiphasic, WNL                    +----------+--------+--------+----------------+-------------------+ +---------+--------+--+--------+--+---------+ VertebralPSV cm/s45EDV cm/s23Antegrade +---------+--------+--+--------+--+---------+   Summary: Right Carotid: Velocities in the right ICA are consistent with a 1-39% stenosis. Left Carotid: Velocities in the left ICA are consistent with a 1-39% stenosis.               Non-hemodynamically significant plaque <50% noted in the CCA. Vertebrals:  Bilateral vertebral arteries demonstrate antegrade flow. Subclavians: Normal flow hemodynamics were seen in bilateral subclavian              arteries. *See table(s) above for measurements and observations.  Electronically signed by Antony Contras MD on 05/26/2020 at 4:29:08 PM.    Final    VAS [place] LOWER EXTREMITY VENOUS (DVT)  Result Date: 05/28/2020  Lower Venous DVT Study Patient Name:  JAYZIAH BANKHEAD  Date of Exam:   05/28/2020 Medical Rec #: [id number]         Accession #:  [id number] Date of Birth: 1940-09-05          Patient Gender: M Patient Age:   80Y Exam Location:  [facility] Procedure:      VAS [place] LOWER EXTREMITY VENOUS (DVT) Referring Phys: [id number] [place]  --------------------------------------------------------------------------------  Indications: Swelling.  Comparison Study: No previous exams Performing Technologist: Rogelia Rohrer  Examination Guidelines: A complete evaluation includes B-mode imaging, spectral Doppler, color Doppler, and power Doppler as needed of all accessible portions of each vessel. Bilateral testing is considered an integral part of a complete examination. Limited examinations for reoccurring indications may be performed as noted. The reflux portion of the exam is performed with the patient in reverse Trendelenburg.  +---------+---------------+---------+-----------+----------+--------------+ RIGHT    CompressibilityPhasicitySpontaneityPropertiesThrombus Aging +---------+---------------+---------+-----------+----------+--------------+ CFV      Full           Yes      Yes                                 +---------+---------------+---------+-----------+----------+--------------+ SFJ      Full                                                        +---------+---------------+---------+-----------+----------+--------------+ FV Prox  Full           Yes      Yes                                 +---------+---------------+---------+-----------+----------+--------------+ FV Mid   Full           Yes      Yes                                 +---------+---------------+---------+-----------+----------+--------------+ FV DistalFull           Yes      Yes                                 +---------+---------------+---------+-----------+----------+--------------+ PFV      Full                                                        +---------+---------------+---------+-----------+----------+--------------+ POP      Full           Yes      Yes                                 +---------+---------------+---------+-----------+----------+--------------+ PTV      Full                                                         +---------+---------------+---------+-----------+----------+--------------+ PERO     Full                                                        +---------+---------------+---------+-----------+----------+--------------+   +---------+---------------+---------+-----------+----------+--------------+  LEFT     CompressibilityPhasicitySpontaneityPropertiesThrombus Aging +---------+---------------+---------+-----------+----------+--------------+ CFV      Full           Yes      Yes                                 +---------+---------------+---------+-----------+----------+--------------+ SFJ      Full                                                        +---------+---------------+---------+-----------+----------+--------------+ FV Prox  Full           Yes      Yes                                 +---------+---------------+---------+-----------+----------+--------------+ FV Mid   Full           Yes      Yes                                 +---------+---------------+---------+-----------+----------+--------------+ FV DistalFull           Yes      Yes                                 +---------+---------------+---------+-----------+----------+--------------+ PFV      Full                                                        +---------+---------------+---------+-----------+----------+--------------+ POP      Full           Yes      Yes                                 +---------+---------------+---------+-----------+----------+--------------+ PTV      Full                                                        +---------+---------------+---------+-----------+----------+--------------+ PERO     Full                                                        +---------+---------------+---------+-----------+----------+--------------+     Summary: BILATERAL: - No evidence of deep vein thrombosis seen in the lower extremities, bilaterally. - No  evidence of superficial venous thrombosis in the lower extremities, bilaterally. -No evidence of popliteal cyst, bilaterally.   *See table(s) above for measurements and observations. Electronically signed by Deitra Mayo MD on 05/28/2020 at 8:13:57 PM.    Final  Assessment/Plan  1. Unwitnessed fall -  Thought to be due to UTI and dehydration and back pain -   Will have home health PT and OT, for therapeutic strengthening exercises  2. Benign essential HTN - hydrochlorothiazide (MICROZIDE) 12.5 MG capsule; Take 1 capsule (12.5 mg total) by mouth daily.  Dispense: 30 capsule; Refill: 0 - losartan (COZAAR) 100 MG tablet; Take 1 tablet (100 mg total) by mouth daily.  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  Dispense: 15 mL; Refill: 0 - sitaGLIPtin (JANUVIA) 50 MG tablet; Take 1 tablet (50 mg total) by mouth daily.  Dispense: 30 tablet; Refill: 0 - TRESIBA FLEXTOUCH 100 UNIT/ML FlexTouch Pen; Inject 34 Units into the skin daily.  Dispense: 3 mL; Refill: 0  4. CAD in native artery - rosuvastatin (CRESTOR) 10 MG tablet; Take 1 tablet (10 mg total) by mouth daily.  Dispense: 30 tablet; Refill: 0 - XARELTO 20 MG TABS tablet; Take 1 tablet (20 mg total) by mouth daily.  Dispense: 30 tablet; Refill: 0  5. Chronic midline low back pain with left-sided sciatica - methocarbamol (ROBAXIN) 500 MG tablet; Take 1 tablet (500 mg total) by mouth every 8 (eight) hours as needed for muscle spasms.  Dispense: 30 tablet; Refill: 0 -Continue oxycodone IR 5 mg 1 tab every 6 hours PRN and Pregbalin 300 mg 1 capsule PO BID -  He follows up with pain clinic  6. UTI -  Completed Keflex    I have filled out patient's discharge paperwork and e-prescribed medications.   Patient will have home health PT and OT.  DME provided:  Walker, 3-in-1 commode and shower chair  Total discharge time: Greater than 30 minutes Greater than 50% was spent in counseling and coordination   Discharge time involved coordination of the discharge process with social worker, nursing staff and therapy department. Medical justification for home health services/DME verified.    Durenda Age, DNP, MSN, FNP-BC [facility] [phone] (Monday-Friday 8:00 a.m. - 5:00 p.m.) [phone] (after hours)

## 2020-06-13 MED ORDER — LOSARTAN POTASSIUM 100 MG PO TABS: 100.0000 mg | ORAL_TABLET | Freq: Every day | ORAL | 0 refills | Status: DC

## 2020-06-13 MED ORDER — ROSUVASTATIN CALCIUM 10 MG PO TABS: 10.0000 mg | ORAL_TABLET | Freq: Every day | ORAL | 0 refills | Status: DC

## 2020-06-13 MED ORDER — FLUTICASONE PROPIONATE 50 MCG/ACT NA SUSP: 1.0000 | Freq: Every day | NASAL | 0 refills | Status: DC | PRN

## 2020-06-13 MED ORDER — METHOCARBAMOL 500 MG PO TABS: 500.0000 mg | ORAL_TABLET | Freq: Three times a day (TID) | ORAL | 0 refills | Status: DC | PRN

## 2020-06-13 MED ORDER — XARELTO 20 MG PO TABS: 20.0000 mg | ORAL_TABLET | Freq: Every day | ORAL | 0 refills | Status: DC

## 2020-06-13 MED ORDER — HYDROCHLOROTHIAZIDE 12.5 MG PO CAPS: 12.5000 mg | ORAL_CAPSULE | Freq: Every day | ORAL | 0 refills | Status: DC

## 2020-06-13 MED ORDER — PANTOPRAZOLE SODIUM 40 MG PO TBEC: 40.0000 mg | DELAYED_RELEASE_TABLET | Freq: Every morning | ORAL | 0 refills | Status: DC

## 2020-06-13 MED ORDER — SITAGLIPTIN PHOSPHATE 50 MG PO TABS: 50.0000 mg | ORAL_TABLET | Freq: Every day | ORAL | 0 refills | Status: DC

## 2020-06-13 MED ORDER — FUROSEMIDE 20 MG PO TABS: 20.0000 mg | ORAL_TABLET | Freq: Every day | ORAL | 0 refills | Status: AC | PRN

## 2020-06-13 MED ORDER — TRESIBA FLEXTOUCH 100 UNIT/ML ~~LOC~~ SOPN: 34.0000 [IU] | PEN_INJECTOR | Freq: Every day | SUBCUTANEOUS | 0 refills | Status: DC

## 2020-06-13 MED ORDER — INSULIN ASPART 100 UNIT/ML FLEXPEN: 5.0000 [IU] | PEN_INJECTOR | Freq: Three times a day (TID) | SUBCUTANEOUS | 0 refills | Status: DC

## 2020-06-14 ENCOUNTER — Telehealth: Payer: Self-pay | Admitting: Adult Health

## 2020-06-14 NOTE — Telephone Encounter (Signed)
Pt called and LVM stating that he has received a letter about a missed appt and was wanting to speak to someone about it because he did not miss this appt on purpose, he missed it due to being in the hospital and rehab. Please advise.

## 2020-06-15 ENCOUNTER — Ambulatory Visit: Payer: Medicare Other

## 2020-06-15 DIAGNOSIS — Z7901 Long term (current) use of anticoagulants: Secondary | ICD-10-CM | POA: Diagnosis not present

## 2020-06-15 DIAGNOSIS — D649 Anemia, unspecified: Secondary | ICD-10-CM | POA: Diagnosis not present

## 2020-06-15 DIAGNOSIS — E1165 Type 2 diabetes mellitus with hyperglycemia: Secondary | ICD-10-CM | POA: Diagnosis not present

## 2020-06-15 DIAGNOSIS — G4733 Obstructive sleep apnea (adult) (pediatric): Secondary | ICD-10-CM | POA: Diagnosis not present

## 2020-06-15 DIAGNOSIS — I5032 Chronic diastolic (congestive) heart failure: Secondary | ICD-10-CM | POA: Diagnosis not present

## 2020-06-15 DIAGNOSIS — N1831 Chronic kidney disease, stage 3a: Secondary | ICD-10-CM | POA: Diagnosis not present

## 2020-06-15 DIAGNOSIS — Z87440 Personal history of urinary (tract) infections: Secondary | ICD-10-CM | POA: Diagnosis not present

## 2020-06-15 DIAGNOSIS — I10 Essential (primary) hypertension: Secondary | ICD-10-CM | POA: Diagnosis not present

## 2020-06-15 DIAGNOSIS — F329 Major depressive disorder, single episode, unspecified: Secondary | ICD-10-CM | POA: Diagnosis not present

## 2020-06-15 DIAGNOSIS — I130 Hypertensive heart and chronic kidney disease with heart failure and stage 1 through stage 4 chronic kidney disease, or unspecified chronic kidney disease: Secondary | ICD-10-CM | POA: Diagnosis not present

## 2020-06-15 DIAGNOSIS — Z7951 Long term (current) use of inhaled steroids: Secondary | ICD-10-CM | POA: Diagnosis not present

## 2020-06-15 DIAGNOSIS — M5136 Other intervertebral disc degeneration, lumbar region: Secondary | ICD-10-CM | POA: Diagnosis not present

## 2020-06-15 DIAGNOSIS — Z8673 Personal history of transient ischemic attack (TIA), and cerebral infarction without residual deficits: Secondary | ICD-10-CM | POA: Diagnosis not present

## 2020-06-15 DIAGNOSIS — E1122 Type 2 diabetes mellitus with diabetic chronic kidney disease: Secondary | ICD-10-CM | POA: Diagnosis not present

## 2020-06-15 DIAGNOSIS — Z9181 History of falling: Secondary | ICD-10-CM | POA: Diagnosis not present

## 2020-06-15 DIAGNOSIS — N400 Enlarged prostate without lower urinary tract symptoms: Secondary | ICD-10-CM | POA: Diagnosis not present

## 2020-06-15 DIAGNOSIS — I2510 Atherosclerotic heart disease of native coronary artery without angina pectoris: Secondary | ICD-10-CM | POA: Diagnosis not present

## 2020-06-15 DIAGNOSIS — M48061 Spinal stenosis, lumbar region without neurogenic claudication: Secondary | ICD-10-CM | POA: Diagnosis not present

## 2020-06-15 DIAGNOSIS — E1140 Type 2 diabetes mellitus with diabetic neuropathy, unspecified: Secondary | ICD-10-CM | POA: Diagnosis not present

## 2020-06-15 DIAGNOSIS — Z86711 Personal history of pulmonary embolism: Secondary | ICD-10-CM | POA: Diagnosis not present

## 2020-06-15 DIAGNOSIS — F32A Depression, unspecified: Secondary | ICD-10-CM | POA: Diagnosis not present

## 2020-06-15 DIAGNOSIS — N183 Chronic kidney disease, stage 3 unspecified: Secondary | ICD-10-CM | POA: Diagnosis not present

## 2020-06-15 DIAGNOSIS — K219 Gastro-esophageal reflux disease without esophagitis: Secondary | ICD-10-CM | POA: Diagnosis not present

## 2020-06-15 DIAGNOSIS — W19XXXD Unspecified fall, subsequent encounter: Secondary | ICD-10-CM | POA: Diagnosis not present

## 2020-06-15 DIAGNOSIS — E785 Hyperlipidemia, unspecified: Secondary | ICD-10-CM | POA: Diagnosis not present

## 2020-06-15 DIAGNOSIS — Z794 Long term (current) use of insulin: Secondary | ICD-10-CM | POA: Diagnosis not present

## 2020-06-15 DIAGNOSIS — Z7984 Long term (current) use of oral hypoglycemic drugs: Secondary | ICD-10-CM | POA: Diagnosis not present

## 2020-06-15 DIAGNOSIS — E782 Mixed hyperlipidemia: Secondary | ICD-10-CM | POA: Diagnosis not present

## 2020-06-16 DIAGNOSIS — E1140 Type 2 diabetes mellitus with diabetic neuropathy, unspecified: Secondary | ICD-10-CM | POA: Diagnosis not present

## 2020-06-16 DIAGNOSIS — R410 Disorientation, unspecified: Secondary | ICD-10-CM | POA: Diagnosis not present

## 2020-06-16 DIAGNOSIS — E1165 Type 2 diabetes mellitus with hyperglycemia: Secondary | ICD-10-CM | POA: Diagnosis not present

## 2020-06-16 DIAGNOSIS — D649 Anemia, unspecified: Secondary | ICD-10-CM | POA: Diagnosis not present

## 2020-06-16 DIAGNOSIS — E1122 Type 2 diabetes mellitus with diabetic chronic kidney disease: Secondary | ICD-10-CM | POA: Diagnosis not present

## 2020-06-16 DIAGNOSIS — Z09 Encounter for follow-up examination after completed treatment for conditions other than malignant neoplasm: Secondary | ICD-10-CM | POA: Diagnosis not present

## 2020-06-16 DIAGNOSIS — I10 Essential (primary) hypertension: Secondary | ICD-10-CM | POA: Diagnosis not present

## 2020-06-16 DIAGNOSIS — E559 Vitamin D deficiency, unspecified: Secondary | ICD-10-CM | POA: Diagnosis not present

## 2020-06-16 DIAGNOSIS — I5032 Chronic diastolic (congestive) heart failure: Secondary | ICD-10-CM | POA: Diagnosis not present

## 2020-06-16 DIAGNOSIS — E782 Mixed hyperlipidemia: Secondary | ICD-10-CM | POA: Diagnosis not present

## 2020-06-16 DIAGNOSIS — M79604 Pain in right leg: Secondary | ICD-10-CM | POA: Diagnosis not present

## 2020-06-16 DIAGNOSIS — M48061 Spinal stenosis, lumbar region without neurogenic claudication: Secondary | ICD-10-CM | POA: Diagnosis not present

## 2020-06-16 DIAGNOSIS — M5136 Other intervertebral disc degeneration, lumbar region: Secondary | ICD-10-CM | POA: Diagnosis not present

## 2020-06-16 DIAGNOSIS — N390 Urinary tract infection, site not specified: Secondary | ICD-10-CM | POA: Diagnosis not present

## 2020-06-16 DIAGNOSIS — I130 Hypertensive heart and chronic kidney disease with heart failure and stage 1 through stage 4 chronic kidney disease, or unspecified chronic kidney disease: Secondary | ICD-10-CM | POA: Diagnosis not present

## 2020-06-17 ENCOUNTER — Ambulatory Visit: Payer: Medicare Other

## 2020-06-18 DIAGNOSIS — E1165 Type 2 diabetes mellitus with hyperglycemia: Secondary | ICD-10-CM | POA: Diagnosis not present

## 2020-06-18 DIAGNOSIS — E1122 Type 2 diabetes mellitus with diabetic chronic kidney disease: Secondary | ICD-10-CM | POA: Diagnosis not present

## 2020-06-18 DIAGNOSIS — I130 Hypertensive heart and chronic kidney disease with heart failure and stage 1 through stage 4 chronic kidney disease, or unspecified chronic kidney disease: Secondary | ICD-10-CM | POA: Diagnosis not present

## 2020-06-18 DIAGNOSIS — M5136 Other intervertebral disc degeneration, lumbar region: Secondary | ICD-10-CM | POA: Diagnosis not present

## 2020-06-18 DIAGNOSIS — I5032 Chronic diastolic (congestive) heart failure: Secondary | ICD-10-CM | POA: Diagnosis not present

## 2020-06-18 DIAGNOSIS — M48061 Spinal stenosis, lumbar region without neurogenic claudication: Secondary | ICD-10-CM | POA: Diagnosis not present

## 2020-06-21 DIAGNOSIS — I5032 Chronic diastolic (congestive) heart failure: Secondary | ICD-10-CM | POA: Diagnosis not present

## 2020-06-21 DIAGNOSIS — E1122 Type 2 diabetes mellitus with diabetic chronic kidney disease: Secondary | ICD-10-CM | POA: Diagnosis not present

## 2020-06-21 DIAGNOSIS — M5136 Other intervertebral disc degeneration, lumbar region: Secondary | ICD-10-CM | POA: Diagnosis not present

## 2020-06-21 DIAGNOSIS — M48061 Spinal stenosis, lumbar region without neurogenic claudication: Secondary | ICD-10-CM | POA: Diagnosis not present

## 2020-06-21 DIAGNOSIS — E1165 Type 2 diabetes mellitus with hyperglycemia: Secondary | ICD-10-CM | POA: Diagnosis not present

## 2020-06-21 DIAGNOSIS — I130 Hypertensive heart and chronic kidney disease with heart failure and stage 1 through stage 4 chronic kidney disease, or unspecified chronic kidney disease: Secondary | ICD-10-CM | POA: Diagnosis not present

## 2020-06-22 ENCOUNTER — Ambulatory Visit: Payer: Medicare Other

## 2020-06-22 DIAGNOSIS — E1122 Type 2 diabetes mellitus with diabetic chronic kidney disease: Secondary | ICD-10-CM | POA: Diagnosis not present

## 2020-06-22 DIAGNOSIS — M5136 Other intervertebral disc degeneration, lumbar region: Secondary | ICD-10-CM | POA: Diagnosis not present

## 2020-06-22 DIAGNOSIS — I5032 Chronic diastolic (congestive) heart failure: Secondary | ICD-10-CM | POA: Diagnosis not present

## 2020-06-22 DIAGNOSIS — I130 Hypertensive heart and chronic kidney disease with heart failure and stage 1 through stage 4 chronic kidney disease, or unspecified chronic kidney disease: Secondary | ICD-10-CM | POA: Diagnosis not present

## 2020-06-22 DIAGNOSIS — M48061 Spinal stenosis, lumbar region without neurogenic claudication: Secondary | ICD-10-CM | POA: Diagnosis not present

## 2020-06-22 DIAGNOSIS — E1165 Type 2 diabetes mellitus with hyperglycemia: Secondary | ICD-10-CM | POA: Diagnosis not present

## 2020-06-24 ENCOUNTER — Ambulatory Visit: Payer: Medicare Other

## 2020-06-24 DIAGNOSIS — M48061 Spinal stenosis, lumbar region without neurogenic claudication: Secondary | ICD-10-CM | POA: Diagnosis not present

## 2020-06-24 DIAGNOSIS — I130 Hypertensive heart and chronic kidney disease with heart failure and stage 1 through stage 4 chronic kidney disease, or unspecified chronic kidney disease: Secondary | ICD-10-CM | POA: Diagnosis not present

## 2020-06-24 DIAGNOSIS — E1165 Type 2 diabetes mellitus with hyperglycemia: Secondary | ICD-10-CM | POA: Diagnosis not present

## 2020-06-24 DIAGNOSIS — M5136 Other intervertebral disc degeneration, lumbar region: Secondary | ICD-10-CM | POA: Diagnosis not present

## 2020-06-24 DIAGNOSIS — E1122 Type 2 diabetes mellitus with diabetic chronic kidney disease: Secondary | ICD-10-CM | POA: Diagnosis not present

## 2020-06-24 DIAGNOSIS — I5032 Chronic diastolic (congestive) heart failure: Secondary | ICD-10-CM | POA: Diagnosis not present

## 2020-06-25 ENCOUNTER — Other Ambulatory Visit: Payer: Self-pay | Admitting: Adult Health

## 2020-06-28 DIAGNOSIS — M48061 Spinal stenosis, lumbar region without neurogenic claudication: Secondary | ICD-10-CM | POA: Diagnosis not present

## 2020-06-28 DIAGNOSIS — E1122 Type 2 diabetes mellitus with diabetic chronic kidney disease: Secondary | ICD-10-CM | POA: Diagnosis not present

## 2020-06-28 DIAGNOSIS — I130 Hypertensive heart and chronic kidney disease with heart failure and stage 1 through stage 4 chronic kidney disease, or unspecified chronic kidney disease: Secondary | ICD-10-CM | POA: Diagnosis not present

## 2020-06-28 DIAGNOSIS — M5136 Other intervertebral disc degeneration, lumbar region: Secondary | ICD-10-CM | POA: Diagnosis not present

## 2020-06-28 DIAGNOSIS — E1165 Type 2 diabetes mellitus with hyperglycemia: Secondary | ICD-10-CM | POA: Diagnosis not present

## 2020-06-28 DIAGNOSIS — I5032 Chronic diastolic (congestive) heart failure: Secondary | ICD-10-CM | POA: Diagnosis not present

## 2020-07-02 DIAGNOSIS — I5032 Chronic diastolic (congestive) heart failure: Secondary | ICD-10-CM | POA: Diagnosis not present

## 2020-07-02 DIAGNOSIS — M48061 Spinal stenosis, lumbar region without neurogenic claudication: Secondary | ICD-10-CM | POA: Diagnosis not present

## 2020-07-02 DIAGNOSIS — I130 Hypertensive heart and chronic kidney disease with heart failure and stage 1 through stage 4 chronic kidney disease, or unspecified chronic kidney disease: Secondary | ICD-10-CM | POA: Diagnosis not present

## 2020-07-02 DIAGNOSIS — E1122 Type 2 diabetes mellitus with diabetic chronic kidney disease: Secondary | ICD-10-CM | POA: Diagnosis not present

## 2020-07-02 DIAGNOSIS — E1165 Type 2 diabetes mellitus with hyperglycemia: Secondary | ICD-10-CM | POA: Diagnosis not present

## 2020-07-02 DIAGNOSIS — M5136 Other intervertebral disc degeneration, lumbar region: Secondary | ICD-10-CM | POA: Diagnosis not present

## 2020-07-05 ENCOUNTER — Other Ambulatory Visit: Payer: Self-pay

## 2020-07-05 ENCOUNTER — Ambulatory Visit
Admission: EM | Admit: 2020-07-05 | Discharge: 2020-07-05 | Disposition: A | Discharge status: Home / Self Care | Payer: Medicare Other | Attending: Emergency Medicine | Admitting: Emergency Medicine

## 2020-07-05 DIAGNOSIS — E1122 Type 2 diabetes mellitus with diabetic chronic kidney disease: Secondary | ICD-10-CM | POA: Diagnosis not present

## 2020-07-05 DIAGNOSIS — M48061 Spinal stenosis, lumbar region without neurogenic claudication: Secondary | ICD-10-CM | POA: Diagnosis not present

## 2020-07-05 DIAGNOSIS — I130 Hypertensive heart and chronic kidney disease with heart failure and stage 1 through stage 4 chronic kidney disease, or unspecified chronic kidney disease: Secondary | ICD-10-CM | POA: Diagnosis not present

## 2020-07-05 DIAGNOSIS — E1165 Type 2 diabetes mellitus with hyperglycemia: Secondary | ICD-10-CM | POA: Diagnosis not present

## 2020-07-05 DIAGNOSIS — H10413 Chronic giant papillary conjunctivitis, bilateral: Secondary | ICD-10-CM | POA: Diagnosis not present

## 2020-07-05 DIAGNOSIS — R3121 Asymptomatic microscopic hematuria: Secondary | ICD-10-CM | POA: Diagnosis not present

## 2020-07-05 DIAGNOSIS — M5136 Other intervertebral disc degeneration, lumbar region: Secondary | ICD-10-CM | POA: Diagnosis not present

## 2020-07-05 DIAGNOSIS — I5032 Chronic diastolic (congestive) heart failure: Secondary | ICD-10-CM | POA: Diagnosis not present

## 2020-07-05 LAB — POCT URINALYSIS DIP (MANUAL ENTRY)
Bilirubin, UA: NEGATIVE
Glucose, UA: NEGATIVE mg/dL
Ketones, POC UA: NEGATIVE mg/dL
Leukocytes, UA: NEGATIVE
Nitrite, UA: NEGATIVE
Protein Ur, POC: NEGATIVE mg/dL
Spec Grav, UA: 1.02 (ref 1.010–1.025)
Urobilinogen, UA: 0.2 E.U./dL
pH, UA: 5.5 (ref 5.0–8.0)

## 2020-07-05 NOTE — ED Triage Notes (Signed)
Pt states 3-4 wks ago he was admitted to hospital for a wk then to a nsg home d/t sciatica and UTI. States wants his urine checked to make sure that doesn't happen again. Denies any urinary sx's at time. States he feels like his sciatica is coming back.

## 2020-07-05 NOTE — ED Provider Notes (Addendum)
HPI  SUBJECTIVE:  Steve Brown is a 80 y.o. male who presents with concern for possible UTI.  He was admitted on 5/17 for fall versus syncope, but also had a UTI.  He completed antibiotics while in the hospital.  He wants to make sure that he does not have a recurrent UTI.  He reports some "cloudy" urine, but has no symptoms.  No urinary urgency, frequency ,odorous urine, hematuria, dysuria.  No penile rash, discharge, testicular scrotal pain, swelling, abdominal, back, pelvic, perineal pain, nausea, vomiting, fevers.  He feels well and wants to make sure that "it does not happen again." Patient has an extensive past medical history including UTI, BPH, chronic kidney disease stage III, coronary artery disease status post PCI ,chronic CHF, diabetes, CVA, PE 2014 on Xarelto, hypertension, prostate cancer.  No history of smoking, bladder cancer.  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    Past Medical History:  Diagnosis Date   Abnormal prostate biopsy    Anticoagulant long-term use    currently xarelto   BPH with elevated PSA    CKD (chronic kidney disease), stage II    Complication of anesthesia    limted neck rom limited use of left arm due to cva   Coronary artery disease    CARDIOLOGIST-  DR Irish Lack--  2010-- PCI w/ stenting midLAD   DDD (degenerative disc disease), lumbar    Degeneration of cervical intervertebral disc    Depression    Diabetes mellitus without complication (HCC)    Dyspnea on exertion    GERD (gastroesophageal reflux disease)    Hemiparesis due to cerebral infarction    History of cerebrovascular accident (CVA) with residual deficit 2002 and 2003--  hemiparisis both sides   per MRI  anterior left frontal lobe, left para midline pons, and inferior cerebullam bilaterally infarcts   History of pulmonary embolus (PE)    06-30-2012  extensive bilaterally   History of recurrent TIAs    History of syncope    hx multiple pre-syncope and syncopal episodes due to  vasovagal, orthostatic hypotension, dehydration   History of TIAs    several since 2002   Hyperlipidemia    Hypertension    Mild atherosclerosis of carotid artery, bilateral    per last duplex 11-04-2014  bilateral ICA 1--39%   Neuropathy    fingers   OSA on CPAP    followed by dr dohmeier--  sev. osa w/ AHI 65.9   Prostate cancer ([place]) dx 2018   Renal insufficiency    S/P coronary artery stent placement 2010   stenting to mid LAD   Simple renal cyst    bilaterally   Stroke [facility])    Trigger finger of both hands 11-17-13   Type 2 diabetes mellitus ([place]) dx 1986   last one A1c 9.2 on 04-26-2016   Unsteady gait    . Hx prior CVA/TIAs;   Vertebral artery occlusion, left    chronic    Past Surgical History:  Procedure Laterality Date   ANTERIOR CERVICAL DECOMP/DISCECTOMY FUSION  2004   C3 -- C6 limited rom   CARDIAC CATHETERIZATION  06-10-2010   dr Irish Lack   wide patent LAD stent, mid lesion at the origin of the septal prior to the previous stent 40-50%/  normal LVF, ef 55%   CARDIOVASCULAR STRESS TEST  10-23-2012  dr Irish Lack   normal nuclear perfusion study w/ no ischemia/  normal LV function and wall motion , ef 65%   CARPAL TUNNEL RELEASE Bilateral  CATARACT EXTRACTION W/ INTRAOCULAR LENS  IMPLANT, BILATERAL     CHOLECYSTECTOMY N/A 11/02/2015   Procedure: LAPAROSCOPIC CHOLECYSTECTOMY WITH INTRAOPERATIVE CHOLANGIOGRAM;  Surgeon: Donnie Mesa, MD;  Location: [place];  Service: General;  Laterality: N/A;   COLONOSCOPY     CORONARY ANGIOPLASTY WITH STENT PLACEMENT  02/2008   stenting to mid LAD   GOLD SEED IMPLANT N/A 11/15/2016   Procedure: GOLD SEED IMPLANT [place];  Surgeon: Ardis Hughs, MD;  Location: [facility];  Service: Urology;  Laterality: N/A;   IR ANGIO INTRA EXTRACRAN SEL COM CAROTID INNOMINATE BILAT MOD SED  06/13/2018   IR ANGIO VERTEBRAL SEL VERTEBRAL UNI R MOD SED  06/13/2018   IR US GUIDE VASC ACCESS RIGHT  06/13/2018   LEFT HEART CATH  AND CORONARY ANGIOGRAPHY N/A 05/25/2017   Procedure: LEFT HEART CATH AND CORONARY ANGIOGRAPHY;  Surgeon: Jettie Booze, MD;  Location: [place] [facility];  Service: Cardiovascular;  Laterality: N/A;   LEFT HEART CATHETERIZATION WITH CORONARY ANGIOGRAM N/A 04/03/2013   Procedure: LEFT HEART CATHETERIZATION WITH CORONARY ANGIOGRAM;  Surgeon: Jettie Booze, MD;  Location: [facility] CATH LAB;  Service: Cardiovascular;  Laterality: N/A;  patent mLAD stent  w/ mild disease in remainder LAD and its branches;  mod. focal lesion midLCFx- FFR of lesion was negative for ischemia/  normal LVSF, ef 50%   lungs  2005   "fluid pumped off lungs"   NEUROPLASTY / TRANSPOSITION ULNAR NERVE AT ELBOW Right 2004   PROSTATE BIOPSY N/A 08/31/2016   Procedure: PROSTATE  BIOPSY TRANSRECTAL ULTRASONIC PROSTATE (TUBP);  Surgeon: Ardis Hughs, MD;  Location: [facility];  Service: Urology;  Laterality: N/A;   SPACE OAR INSTILLATION N/A 11/15/2016   Procedure: SPACE OAR INSTILLATION;  Surgeon: Ardis Hughs, MD;  Location[facility];  Service: Urology;  Laterality: N/A;   TRANSTHORACIC ECHOCARDIOGRAM  04/27/2016   severe focal basal LVH, ef 60-65%,  grade 2 diastoilc dysfunction/  mild AR, MR, and TR/  atrial septum lipomatous hypertrophy/  PASP [id number]   UMBILICAL HERNIA REPAIR      Family History  Problem Relation Age of Onset   Aneurysm Mother    Cancer Father        unknown either pancreatic or prostate   Stroke Brother    Dementia Sister    Heart attack Neg Hx     Social History   Tobacco Use   Smoking status: Never   Smokeless tobacco: Never  Vaping Use   Vaping Use: Never used  Substance Use Topics   Alcohol use: Not Currently   Drug use: Never    No current facility-administered medications for this encounter.  Current Outpatient Medications:    acetaminophen (TYLENOL) 500 MG tablet, Take 500 mg by mouth every 6 (six) hours as needed (pain)., Disp: ,  Rfl:    bisacodyl (DULCOLAX) 10 MG suppository, Place 1 suppository (10 mg total) rectally daily as needed for moderate constipation., Disp: 12 suppository, Rfl: 0   diclofenac Sodium (VOLTAREN) 1 % GEL, Apply 4 g topically 4 (four) times daily., Disp: 100 g, Rfl: 0   fluticasone (FLONASE) 50 MCG/ACT nasal spray, Place 1 spray into both nostrils daily as needed for allergies., Disp: 16 g, Rfl: 0   furosemide (LASIX) 20 MG tablet, Take 1-2 tablets (20-40 mg total) by mouth daily as needed for fluid., Disp: 30 tablet, Rfl: 0   hydrochlorothiazide (MICROZIDE) 12.5 MG capsule, Take 1 capsule (12.5 mg total)  by mouth daily., Disp: 30 capsule, Rfl: 0   insulin aspart (NOVOLOG) 100 UNIT/ML FlexPen, Inject 5 Units into the skin 3 (three) times daily with meals., Disp: 15 mL, Rfl: 0   lidocaine (LIDODERM) 5 %, Place 1 patch onto the skin daily. Remove & Discard patch within 12 hours or as directed by MD, Disp: 30 patch, Rfl: 0   losartan (COZAAR) 100 MG tablet, Take 1 tablet (100 mg total) by mouth daily., Disp: 30 tablet, Rfl: 0   methocarbamol (ROBAXIN) 500 MG tablet, Take 1 tablet (500 mg total) by mouth every 8 (eight) hours as needed for muscle spasms., Disp: 30 tablet, Rfl: 0   oxyCODONE (OXY IR/ROXICODONE) 5 MG immediate release tablet, Take 1 tablet (5 mg total) by mouth every 6 (six) hours as needed for up to 6 doses for severe pain., Disp: 30 tablet, Rfl: 0   pantoprazole (PROTONIX) 40 MG tablet, Take 1 tablet (40 mg total) by mouth every morning., Disp: 30 tablet, Rfl: 0   polyethylene glycol (MIRALAX / GLYCOLAX) 17 g packet, Take 17 g by mouth daily., Disp: 14 each, Rfl: 0   pregabalin (LYRICA) 300 MG capsule, Take 300 mg by mouth 2 (two) times daily., Disp: , Rfl:    rosuvastatin (CRESTOR) 10 MG tablet, Take 1 tablet (10 mg total) by mouth daily., Disp: 30 tablet, Rfl: 0   sitaGLIPtin (JANUVIA) 50 MG tablet, Take 1 tablet (50 mg total) by mouth daily., Disp: 30 tablet, Rfl: 0   TRESIBA FLEXTOUCH  100 UNIT/ML FlexTouch Pen, Inject 34 Units into the skin daily., Disp: 3 mL, Rfl: 0   XARELTO 20 MG TABS tablet, Take 1 tablet (20 mg total) by mouth daily., Disp: 30 tablet, Rfl: 0  Allergies  Allergen Reactions   Other Other (See Comments)    Per patient- cardiac cath dye-  "woke up during procedure hysterical."   Phenergan [Promethazine] Other (See Comments)    Mood changes    Iohexol Other (See Comments)    Patient refuses contrast after having a hysterical event in hospital //r ls spoke with patient    Iohexol    Phenergan [Promethazine]     Other reaction(s): syncope   Tramadol      ROS  As noted in HPI.   Physical Exam  BP (!) 164/85 (BP Location: Left Arm)   Pulse 62   Temp 98.7 F (37.1 C) (Oral)   Resp 18   SpO2 97%   Constitutional: Well developed, well nourished, no acute distress Eyes:  EOMI, conjunctiva normal bilaterally HENT: Normocephalic, atraumatic,mucus membranes moist Respiratory: Normal inspiratory effort Cardiovascular: Normal rate GI: nondistended.  Soft.  Mild suprapubic tenderness.  No flank tenderness. Back: No CVAT skin: No rash, skin intact Musculoskeletal: no deformities Neurologic: Alert & oriented x 3, no focal neuro deficits Psychiatric: Speech and behavior appropriate   ED Course   Medications - No data to display  Orders Placed This Encounter  Procedures   Urine Culture    Standing Status:   Standing    Number of Occurrences:   1   POCT urinalysis dipstick    Standing Status:   Standing    Number of Occurrences:   1    Results for orders placed or performed during the hospital encounter of 07/05/20 (from the past 24 hour(s))  POCT urinalysis dipstick     Status: Abnormal   Collection Time: 07/05/20  4:11 PM  Result Value Ref Range   Color, UA yellow yellow  Clarity, UA clear clear   Glucose, UA negative negative mg/dL   Bilirubin, UA negative negative   Ketones, POC UA negative negative mg/dL   Spec Grav, UA 1.020  1.010 - 1.025   Blood, UA trace-intact (A) negative   pH, UA 5.5 5.0 - 8.0   Protein Ur, POC negative negative mg/dL   Urobilinogen, UA 0.2 0.2 or 1.0 E.U./dL   Nitrite, UA Negative Negative   Leukocytes, UA Negative Negative   No results found.  ED Clinical Impression  1. Asymptomatic microscopic hematuria      ED Assessment/Plan  Previous records, labs reviewed.   Patient has trace hematuria on urine dip.  Negative esterase, nitrites.  He reports cloudy urine but has urinated.  However, he does have some suprapubic tenderness, so will send urine off for culture to make sure that he does not have a UTI.  He has had hematuria before, he had recent CT renal stone 5/16 which was negative for stone.  Discussed with patient that I do not think that he needs antibiotics today in the absence of objective findings of infection.  Advised him to follow-up with his primary care provider in a week or 2 to recheck the urine, and possibly be worked up for hematuria if it persists.  ER return precautions given.  Discussed labs, MDM, treatment plan, and plan for follow-up with patient. Discussed sn/sx that should prompt return to the ED. patient agrees with plan.   No orders of the defined types were placed in this encounter.     *This clinic note was created using Dragon dictation software. Therefore, there may be occasional mistakes despite careful proofreading.  ?    Melynda Ripple, MD 07/06/20 [id number]    Melynda Ripple, MD 07/06/20 [phone]

## 2020-07-05 NOTE — Discharge Instructions (Addendum)
We will contact you if your urine culture grows out an infection.  I will start you on antibiotics at that time.  I do not think that you need antibiotics today.  In The meantime, continue pushing fluids.  Have your urine rechecked in a week or 2 to make sure that the blood has cleared.

## 2020-07-07 LAB — URINE CULTURE: Culture: NO GROWTH

## 2020-07-10 ENCOUNTER — Other Ambulatory Visit: Payer: Self-pay | Admitting: Adult Health

## 2020-07-10 DIAGNOSIS — I10 Essential (primary) hypertension: Secondary | ICD-10-CM

## 2020-07-14 DIAGNOSIS — M5136 Other intervertebral disc degeneration, lumbar region: Secondary | ICD-10-CM | POA: Diagnosis not present

## 2020-07-14 DIAGNOSIS — M48061 Spinal stenosis, lumbar region without neurogenic claudication: Secondary | ICD-10-CM | POA: Diagnosis not present

## 2020-07-14 DIAGNOSIS — I5032 Chronic diastolic (congestive) heart failure: Secondary | ICD-10-CM | POA: Diagnosis not present

## 2020-07-14 DIAGNOSIS — I130 Hypertensive heart and chronic kidney disease with heart failure and stage 1 through stage 4 chronic kidney disease, or unspecified chronic kidney disease: Secondary | ICD-10-CM | POA: Diagnosis not present

## 2020-07-14 DIAGNOSIS — E1122 Type 2 diabetes mellitus with diabetic chronic kidney disease: Secondary | ICD-10-CM | POA: Diagnosis not present

## 2020-07-14 DIAGNOSIS — E1165 Type 2 diabetes mellitus with hyperglycemia: Secondary | ICD-10-CM | POA: Diagnosis not present

## 2020-07-15 DIAGNOSIS — N1831 Chronic kidney disease, stage 3a: Secondary | ICD-10-CM | POA: Diagnosis not present

## 2020-07-15 DIAGNOSIS — W19XXXD Unspecified fall, subsequent encounter: Secondary | ICD-10-CM | POA: Diagnosis not present

## 2020-07-15 DIAGNOSIS — N400 Enlarged prostate without lower urinary tract symptoms: Secondary | ICD-10-CM | POA: Diagnosis not present

## 2020-07-15 DIAGNOSIS — G4733 Obstructive sleep apnea (adult) (pediatric): Secondary | ICD-10-CM | POA: Diagnosis not present

## 2020-07-15 DIAGNOSIS — I5032 Chronic diastolic (congestive) heart failure: Secondary | ICD-10-CM | POA: Diagnosis not present

## 2020-07-15 DIAGNOSIS — I130 Hypertensive heart and chronic kidney disease with heart failure and stage 1 through stage 4 chronic kidney disease, or unspecified chronic kidney disease: Secondary | ICD-10-CM | POA: Diagnosis not present

## 2020-07-15 DIAGNOSIS — Z9181 History of falling: Secondary | ICD-10-CM | POA: Diagnosis not present

## 2020-07-15 DIAGNOSIS — Z7984 Long term (current) use of oral hypoglycemic drugs: Secondary | ICD-10-CM | POA: Diagnosis not present

## 2020-07-15 DIAGNOSIS — Z794 Long term (current) use of insulin: Secondary | ICD-10-CM | POA: Diagnosis not present

## 2020-07-15 DIAGNOSIS — I2510 Atherosclerotic heart disease of native coronary artery without angina pectoris: Secondary | ICD-10-CM | POA: Diagnosis not present

## 2020-07-15 DIAGNOSIS — Z87440 Personal history of urinary (tract) infections: Secondary | ICD-10-CM | POA: Diagnosis not present

## 2020-07-15 DIAGNOSIS — M5136 Other intervertebral disc degeneration, lumbar region: Secondary | ICD-10-CM | POA: Diagnosis not present

## 2020-07-15 DIAGNOSIS — E1122 Type 2 diabetes mellitus with diabetic chronic kidney disease: Secondary | ICD-10-CM | POA: Diagnosis not present

## 2020-07-15 DIAGNOSIS — Z7951 Long term (current) use of inhaled steroids: Secondary | ICD-10-CM | POA: Diagnosis not present

## 2020-07-15 DIAGNOSIS — F32A Depression, unspecified: Secondary | ICD-10-CM | POA: Diagnosis not present

## 2020-07-15 DIAGNOSIS — Z8673 Personal history of transient ischemic attack (TIA), and cerebral infarction without residual deficits: Secondary | ICD-10-CM | POA: Diagnosis not present

## 2020-07-15 DIAGNOSIS — Z86711 Personal history of pulmonary embolism: Secondary | ICD-10-CM | POA: Diagnosis not present

## 2020-07-15 DIAGNOSIS — E1165 Type 2 diabetes mellitus with hyperglycemia: Secondary | ICD-10-CM | POA: Diagnosis not present

## 2020-07-15 DIAGNOSIS — K219 Gastro-esophageal reflux disease without esophagitis: Secondary | ICD-10-CM | POA: Diagnosis not present

## 2020-07-15 DIAGNOSIS — Z7901 Long term (current) use of anticoagulants: Secondary | ICD-10-CM | POA: Diagnosis not present

## 2020-07-15 DIAGNOSIS — E785 Hyperlipidemia, unspecified: Secondary | ICD-10-CM | POA: Diagnosis not present

## 2020-07-15 DIAGNOSIS — M48061 Spinal stenosis, lumbar region without neurogenic claudication: Secondary | ICD-10-CM | POA: Diagnosis not present

## 2020-07-19 DIAGNOSIS — I739 Peripheral vascular disease, unspecified: Secondary | ICD-10-CM | POA: Diagnosis not present

## 2020-07-19 DIAGNOSIS — M5136 Other intervertebral disc degeneration, lumbar region: Secondary | ICD-10-CM | POA: Diagnosis not present

## 2020-07-19 DIAGNOSIS — M48061 Spinal stenosis, lumbar region without neurogenic claudication: Secondary | ICD-10-CM | POA: Diagnosis not present

## 2020-07-19 DIAGNOSIS — L603 Nail dystrophy: Secondary | ICD-10-CM | POA: Diagnosis not present

## 2020-07-19 DIAGNOSIS — E1151 Type 2 diabetes mellitus with diabetic peripheral angiopathy without gangrene: Secondary | ICD-10-CM | POA: Diagnosis not present

## 2020-07-19 DIAGNOSIS — I5032 Chronic diastolic (congestive) heart failure: Secondary | ICD-10-CM | POA: Diagnosis not present

## 2020-07-19 DIAGNOSIS — E1165 Type 2 diabetes mellitus with hyperglycemia: Secondary | ICD-10-CM | POA: Diagnosis not present

## 2020-07-19 DIAGNOSIS — E1122 Type 2 diabetes mellitus with diabetic chronic kidney disease: Secondary | ICD-10-CM | POA: Diagnosis not present

## 2020-07-19 DIAGNOSIS — I130 Hypertensive heart and chronic kidney disease with heart failure and stage 1 through stage 4 chronic kidney disease, or unspecified chronic kidney disease: Secondary | ICD-10-CM | POA: Diagnosis not present

## 2020-07-26 DIAGNOSIS — M5417 Radiculopathy, lumbosacral region: Secondary | ICD-10-CM | POA: Diagnosis not present

## 2020-07-29 DIAGNOSIS — D649 Anemia, unspecified: Secondary | ICD-10-CM | POA: Diagnosis not present

## 2020-07-29 DIAGNOSIS — E782 Mixed hyperlipidemia: Secondary | ICD-10-CM | POA: Diagnosis not present

## 2020-07-29 DIAGNOSIS — B379 Candidiasis, unspecified: Secondary | ICD-10-CM | POA: Diagnosis not present

## 2020-07-29 DIAGNOSIS — I69354 Hemiplegia and hemiparesis following cerebral infarction affecting left non-dominant side: Secondary | ICD-10-CM | POA: Diagnosis not present

## 2020-07-29 DIAGNOSIS — Z87440 Personal history of urinary (tract) infections: Secondary | ICD-10-CM | POA: Diagnosis not present

## 2020-07-29 DIAGNOSIS — A6000 Herpesviral infection of urogenital system, unspecified: Secondary | ICD-10-CM | POA: Diagnosis not present

## 2020-07-29 DIAGNOSIS — I10 Essential (primary) hypertension: Secondary | ICD-10-CM | POA: Diagnosis not present

## 2020-07-29 DIAGNOSIS — Z0000 Encounter for general adult medical examination without abnormal findings: Secondary | ICD-10-CM | POA: Diagnosis not present

## 2020-07-29 DIAGNOSIS — I700 Atherosclerosis of aorta: Secondary | ICD-10-CM | POA: Diagnosis not present

## 2020-07-29 DIAGNOSIS — E559 Vitamin D deficiency, unspecified: Secondary | ICD-10-CM | POA: Diagnosis not present

## 2020-07-29 DIAGNOSIS — I679 Cerebrovascular disease, unspecified: Secondary | ICD-10-CM | POA: Diagnosis not present

## 2020-07-29 DIAGNOSIS — R059 Cough, unspecified: Secondary | ICD-10-CM | POA: Diagnosis not present

## 2020-07-30 DIAGNOSIS — I10 Essential (primary) hypertension: Secondary | ICD-10-CM | POA: Diagnosis not present

## 2020-07-30 DIAGNOSIS — D649 Anemia, unspecified: Secondary | ICD-10-CM | POA: Diagnosis not present

## 2020-07-30 DIAGNOSIS — C61 Malignant neoplasm of prostate: Secondary | ICD-10-CM | POA: Diagnosis not present

## 2020-07-30 DIAGNOSIS — I209 Angina pectoris, unspecified: Secondary | ICD-10-CM | POA: Diagnosis not present

## 2020-07-30 DIAGNOSIS — N183 Chronic kidney disease, stage 3 unspecified: Secondary | ICD-10-CM | POA: Diagnosis not present

## 2020-07-30 DIAGNOSIS — N400 Enlarged prostate without lower urinary tract symptoms: Secondary | ICD-10-CM | POA: Diagnosis not present

## 2020-07-30 DIAGNOSIS — E782 Mixed hyperlipidemia: Secondary | ICD-10-CM | POA: Diagnosis not present

## 2020-07-30 DIAGNOSIS — I2510 Atherosclerotic heart disease of native coronary artery without angina pectoris: Secondary | ICD-10-CM | POA: Diagnosis not present

## 2020-07-30 DIAGNOSIS — E1140 Type 2 diabetes mellitus with diabetic neuropathy, unspecified: Secondary | ICD-10-CM | POA: Diagnosis not present

## 2020-07-30 DIAGNOSIS — K219 Gastro-esophageal reflux disease without esophagitis: Secondary | ICD-10-CM | POA: Diagnosis not present

## 2020-07-30 DIAGNOSIS — F329 Major depressive disorder, single episode, unspecified: Secondary | ICD-10-CM | POA: Diagnosis not present

## 2020-07-30 DIAGNOSIS — I208 Other forms of angina pectoris: Secondary | ICD-10-CM | POA: Diagnosis not present

## 2020-08-06 DIAGNOSIS — H02423 Myogenic ptosis of bilateral eyelids: Secondary | ICD-10-CM | POA: Diagnosis not present

## 2020-08-06 DIAGNOSIS — H02831 Dermatochalasis of right upper eyelid: Secondary | ICD-10-CM | POA: Diagnosis not present

## 2020-08-06 DIAGNOSIS — H02834 Dermatochalasis of left upper eyelid: Secondary | ICD-10-CM | POA: Diagnosis not present

## 2020-08-06 DIAGNOSIS — H04123 Dry eye syndrome of bilateral lacrimal glands: Secondary | ICD-10-CM | POA: Diagnosis not present

## 2020-08-06 DIAGNOSIS — H0279 Other degenerative disorders of eyelid and periocular area: Secondary | ICD-10-CM | POA: Diagnosis not present

## 2020-08-06 DIAGNOSIS — H02413 Mechanical ptosis of bilateral eyelids: Secondary | ICD-10-CM | POA: Diagnosis not present

## 2020-08-06 DIAGNOSIS — H53483 Generalized contraction of visual field, bilateral: Secondary | ICD-10-CM | POA: Diagnosis not present

## 2020-08-06 DIAGNOSIS — H57813 Brow ptosis, bilateral: Secondary | ICD-10-CM | POA: Diagnosis not present

## 2020-08-16 ENCOUNTER — Other Ambulatory Visit: Payer: Self-pay

## 2020-08-16 ENCOUNTER — Ambulatory Visit: Payer: Medicare Other | Attending: Family Medicine | Admitting: Physical Therapy

## 2020-08-16 DIAGNOSIS — M5432 Sciatica, left side: Secondary | ICD-10-CM | POA: Diagnosis not present

## 2020-08-16 DIAGNOSIS — M6281 Muscle weakness (generalized): Secondary | ICD-10-CM | POA: Diagnosis not present

## 2020-08-16 DIAGNOSIS — R208 Other disturbances of skin sensation: Secondary | ICD-10-CM | POA: Insufficient documentation

## 2020-08-16 DIAGNOSIS — R2689 Other abnormalities of gait and mobility: Secondary | ICD-10-CM | POA: Insufficient documentation

## 2020-08-16 DIAGNOSIS — M79605 Pain in left leg: Secondary | ICD-10-CM | POA: Diagnosis not present

## 2020-08-16 NOTE — Therapy (Signed)
[facility] [address] [place], [place], [address] Phone: [phone]   Fax:  [phone]  Physical Therapy Evaluation  Patient Details  Name: Steve Brown MRN: [id number] Date of Birth: 15-Feb-1940 Referring Provider (PT): Lawerance Cruel, MD   Encounter Date: 08/16/2020   PT End of Session - 08/16/20 2133     Visit Number 1    Number of Visits 17    Date for PT Re-Evaluation 10/15/20    Authorization Type Medicare A&B; 10th visit PN    Progress Note Due on Visit 10    PT Start Time 1535    PT Stop Time 1620    PT Time Calculation (min) 45 min    Activity Tolerance Patient tolerated treatment well    Behavior During Therapy [facility] for tasks assessed/performed             Past Medical History:  Diagnosis Date   Abnormal prostate biopsy    Anticoagulant long-term use    currently xarelto   BPH with elevated PSA    CKD (chronic kidney disease), stage II    Complication of anesthesia    limted neck rom limited use of left arm due to cva   Coronary artery disease    CARDIOLOGIST-  DR Irish Lack--  2010-- PCI w/ stenting midLAD   DDD (degenerative disc disease), lumbar    Degeneration of cervical intervertebral disc    Depression    Diabetes mellitus without complication (HCC)    Dyspnea on exertion    GERD (gastroesophageal reflux disease)    Hemiparesis due to cerebral infarction    History of cerebrovascular accident (CVA) with residual deficit 2002 and 2003--  hemiparisis both sides   per MRI  anterior left frontal lobe, left para midline pons, and inferior cerebullam bilaterally infarcts   History of pulmonary embolus (PE)    06-30-2012  extensive bilaterally   History of recurrent TIAs    History of syncope    hx multiple pre-syncope and syncopal episodes due to vasovagal, orthostatic hypotension, dehydration   History of TIAs    several since 2002   Hyperlipidemia    Hypertension    Mild  atherosclerosis of carotid artery, bilateral    per last duplex 11-04-2014  bilateral ICA 1--39%   Neuropathy    fingers   OSA on CPAP    followed by dr dohmeier--  sev. osa w/ AHI 65.9   Prostate cancer ([place]) dx 2018   Renal insufficiency    S/P coronary artery stent placement 2010   stenting to mid LAD   Simple renal cyst    bilaterally   Stroke [facility])    Trigger finger of both hands 11-17-13   Type 2 diabetes mellitus ([place]) dx 1986   last one A1c 9.2 on 04-26-2016   Unsteady gait    . Hx prior CVA/TIAs;   Vertebral artery occlusion, left    chronic    Past Surgical History:  Procedure Laterality Date   ANTERIOR CERVICAL DECOMP/DISCECTOMY FUSION  2004   C3 -- C6 limited rom   CARDIAC CATHETERIZATION  06-10-2010   dr Irish Lack   wide patent LAD stent, mid lesion at the origin of the septal prior to the previous stent 40-50%/  normal LVF, ef 55%   CARDIOVASCULAR STRESS TEST  10-23-2012  dr Irish Lack   normal nuclear perfusion study w/ no ischemia/  normal LV function and wall motion , ef 65%   CARPAL TUNNEL RELEASE Bilateral  CATARACT EXTRACTION W/ INTRAOCULAR LENS  IMPLANT, BILATERAL     CHOLECYSTECTOMY N/A 11/02/2015   Procedure: LAPAROSCOPIC CHOLECYSTECTOMY WITH INTRAOPERATIVE CHOLANGIOGRAM;  Surgeon: Donnie Mesa, MD;  Location: [facility];  Service: General;  Laterality: N/A;   COLONOSCOPY     CORONARY ANGIOPLASTY WITH STENT PLACEMENT  02/2008   stenting to mid LAD   GOLD SEED IMPLANT N/A 11/15/2016   Procedure: GOLD SEED IMPLANT [place];  Surgeon: Ardis Hughs, MD;  Location: [facility];  Service: Urology;  Laterality: N/A;   IR ANGIO INTRA EXTRACRAN SEL COM CAROTID INNOMINATE BILAT MOD SED  06/13/2018   IR ANGIO VERTEBRAL SEL VERTEBRAL UNI R MOD SED  06/13/2018   IR US GUIDE VASC ACCESS RIGHT  06/13/2018   LEFT HEART CATH AND CORONARY ANGIOGRAPHY N/A 05/25/2017   Procedure: LEFT HEART CATH AND CORONARY ANGIOGRAPHY;  Surgeon: Jettie Booze, MD;   Location: [facility] CV LAB;  Service: Cardiovascular;  Laterality: N/A;   LEFT HEART CATHETERIZATION WITH CORONARY ANGIOGRAM N/A 04/03/2013   Procedure: LEFT HEART CATHETERIZATION WITH CORONARY ANGIOGRAM;  Surgeon: Jettie Booze, MD;  Location: [facility] CATH LAB;  Service: Cardiovascular;  Laterality: N/A;  patent mLAD stent  w/ mild disease in remainder LAD and its branches;  mod. focal lesion midLCFx- FFR of lesion was negative for ischemia/  normal LVSF, ef 50%   lungs  2005   "fluid pumped off lungs"   NEUROPLASTY / TRANSPOSITION ULNAR NERVE AT ELBOW Right 2004   PROSTATE BIOPSY N/A 08/31/2016   Procedure: PROSTATE  BIOPSY TRANSRECTAL ULTRASONIC PROSTATE (TUBP);  Surgeon: Ardis Hughs, MD;  Location: [facility];  Service: Urology;  Laterality: N/A;   SPACE OAR INSTILLATION N/A 11/15/2016   Procedure: SPACE OAR INSTILLATION;  Surgeon: Ardis Hughs, MD;  Location: [facility];  Service: Urology;  Laterality: N/A;   TRANSTHORACIC ECHOCARDIOGRAM  04/27/2016   severe focal basal LVH, ef 60-65%,  grade 2 diastoilc dysfunction/  mild AR, MR, and TR/  atrial septum lipomatous hypertrophy/  PASP [id number]   UMBILICAL HERNIA REPAIR      There were no vitals filed for this visit.    Subjective Assessment - 08/16/20 1539     Subjective Pt was working PT for sciatica and was making good progress.  Pt had 1-2 days of sudden onset of pain, inability to move LLE and inability to transfer in and out of bed.  Went to hospitla and found to have UTI; spent 1 week in the hospital and 1 week at [facility].  Since D/C home pt has experienced a return of back pain and LLE pain and decreased strength.    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 Yes    Pain Score 6     Pain Location Leg    Pain Orientation Left    Pain Descriptors / Indicators Pins and needles;Dull;Aching    Pain Type Neuropathic pain    Pain Radiating Towards from back > LLE    Pain Onset More than a month ago  [facility] PT Assessment - 08/16/20 1545       Assessment   Medical Diagnosis Other abnormalities of gait and mobility    Referring Provider (PT) Lawerance Cruel, MD    Onset Date/Surgical Date 07/30/20    Prior Therapy yes outpatient PT, SNF      Precautions   Precautions Other (comment)    Precaution Comments Long-term anticoagulant use, BPH with elevated PSA, CKD, CAD, DDD lumbar, degeneration of cervical intervertebral disc, depression, DM, DOE, hemiparesis due to CVA, multiple CVA, ACDF C3-C6, bilat carpal tunnel release, PE, h/o syncope, h/o TIA, HLD, HTN, neuropathy, OSA, prostate CA, Vertebral artery occlusion, 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 1 when he had UTI, fell and hit head against wall      [place] residence    Living Arrangements Alone    Type of [place] to enter    Entrance Stairs-Number of Steps [place] One level    [place] - single point;Walker - 2 wheels    Additional Comments Using cane intermittently, keeps it in the car      Prior Function   Level of [place] Retired    Leisure Is back at [employer] - walking on the track - working up to 7 miles.  Stationary bike.      Observation/Other Assessments-Edema    Edema --   Reports edema in LLE  only     Sensation   Light Touch Impaired Detail    Light Touch Impaired Details Impaired LUE;Impaired LLE    Proprioception Appears Intact      ROM / Strength   AROM / PROM / Strength AROM;Strength      AROM   Overall AROM  Deficits;Due to pain    Overall AROM Comments Limited lumbar AROM with pain across low back with flexion, extension, lateral flexion and rotation.  Most limited with rotation to L    AROM Assessment Site Lumbar      Strength   Overall Strength Deficits    Overall Strength Comments RLE: 5/5, LLE: 4-/5 hip flexion and knee extension, 4+ knee flexion, 5/5 ankle DF      Flexibility   Hamstrings R: 80 deg flexion; L: 60 deg flexion    Quadriceps When performing hip ER on LLE pt report pain down quadricep mm      Special Tests    Special Tests Hip Special Tests    Hip Special Tests  [employer] Test    Findings Positive    Side Left    Comments lacking 20 deg to full extension      Ambulation/Gait   Ambulation/Gait Yes    Ambulation/Gait Assistance 5: Supervision    Ambulation/Gait Assistance Details veering to L, antalgic    Ambulation Distance (Feet) 115 Feet    Assistive device None    Gait Pattern Antalgic;Decreased trunk rotation;Wide base of support;Decreased stride length;Decreased arm swing - left;Step-through pattern;Decreased arm swing - right    Ambulation Surface Level;Indoor      Standardized Balance Assessment   Standardized Balance Assessment Five Times Sit to Stand;10 meter walk test    Five times sit to stand comments  18.2 seconds without UE support    10 Meter Walk 13.3 seconds or 2.46 ft/sec             Objective measurements completed on examination: See above findings.     PT Education - 08/16/20 2133     Education Details clinical findings, PT POC and goals    Person(s) Educated Patient    Methods Explanation    Comprehension Verbalized understanding              PT Short Term Goals - 08/17/20 1211        PT SHORT TERM GOAL #1   Title Pt will demonstrate independence with updated and revised HEP    Time 4    Period Weeks    Status New    Target Date 09/16/20      PT SHORT TERM GOAL #2   Title Pt will demonstrate 10 degrees increase in L hamstring ROM and L hip flexor ROM    Baseline 60 deg hamstring, lacking 20 deg to neutral hip extension LLE Thomas test    Time 4    Period Weeks    Status New    Target Date 09/16/20      PT SHORT TERM GOAL #3   Title Pt will participate in stair negotiation assessment with LTG to be set    Baseline TBD    Time 4    Period Weeks    Status New    Target Date 09/16/20      PT SHORT TERM GOAL #4   Title Pt will decrease five time sit to stand to </= 15 seconds without UE support    Baseline 18.2 seconds    Time 4    Period Weeks    Status New    Target Date 09/16/20      PT SHORT TERM GOAL #5   Title Pt will increase gait velocity to >/= 2.8 ft/sec without AD and with decreased antalgic gait sequence    Baseline 2.46 ft/sec    Time 4    Period Weeks    Status New    Target Date 09/16/20               PT Long Term Goals - 08/17/20 1217       PT LONG TERM GOAL #1   Title Pt will be independent with final HEP and walking program at [facility]    Time 8    Period Weeks    Status New    Target Date 10/16/20      PT LONG TERM GOAL #2   Title Pt will negotiate 16 stairs with one rail, alternating sequence MOD I    Time 8    Period Weeks    Status New    Target Date 10/16/20      PT LONG TERM GOAL #3   Title Pt will demonstrate symmetrical hamstring length and hip extension (Thomas test) L to R side    Time 8    Period Weeks    Status New    Target Date 10/16/20      PT LONG TERM GOAL #4   Title Pt will decrease five time sit to stand to </=13 seconds to indicate decreased falls risk    Time 8    Period Weeks    Status New    Target Date 10/16/20      PT LONG TERM GOAL #5   Title Pt will increase gait velocity to  >/=  3.0 ft/sec without AD    Time 8    Period Weeks    Status New    Target Date 10/16/20              Plan - 08/16/20 2137     Clinical Impression Statement Pt is a 80 year old male referred to Neuro OPPT for evaluation of gait abnormality, falls and LLE pain.  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 The following deficits were noted during pt's exam: impaired sensation in LUE and LLE, impaired LLE range of motion and mm tissue length, impaired lumbar AROM, neuropathic and radicular pain in LLE, impaired strength, impaired balance and impaired gait.  Pt's gait velocity and five time sit to stand scores indicate pt is at increased risk for falls. Pt would benefit from skilled PT to address these impairments and functional limitations to maximize functional mobility independence and reduce falls risk.    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    Examination-Activity Limitations [place];Locomotion Level;Stairs;Stand    Examination-Participation Restrictions Community Activity;Other   Fitness   Stability/Clinical Decision Making Evolving/Moderate complexity    Clinical Decision Making Moderate    Rehab Potential Good    PT Frequency 2x / week     PT Duration 8 weeks    PT Treatment/Interventions ADLs/Self Care Home Management;Aquatic Therapy;DME Instruction;Gait training;Stair training;Functional mobility training;Therapeutic activities;Therapeutic exercise;Balance training;Neuromuscular re-education;Patient/family education;Orthotic Fit/Training;Cryotherapy;Moist Heat;Manual techniques;Passive range of motion;Dry needling;Taping    PT Next Visit Plan Returns after being hospitalized and SNF stay.  Restart HEP focusing on LE strengthening, hamstring and L hip flexor stretching, lumbar mobility, standing balance.  Manual therapy for lumbar mobility.    PT Home Exercise Plan [id number]    Consulted and Agree with Plan of Care Patient             Patient will benefit from skilled therapeutic intervention in order to improve the following deficits and impairments:  Decreased activity tolerance, Decreased balance, Cardiopulmonary status limiting activity, Decreased endurance, Decreased range of motion, Decreased strength, Difficulty walking, Impaired sensation, Postural dysfunction, Pain  Visit Diagnosis: Pain in left leg  Other abnormalities of gait and mobility  Muscle weakness (generalized)  Other disturbances of skin sensation     Problem List Patient Active Problem List   Diagnosis Date Noted   Syncope 05/26/2020   Back pain [id number]   Acute metabolic encephalopathy [id number]   Lumbar spinal stenosis 08/15/2019   Nonspecific chest pain 08/11/2019   Shortness of breath    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 ([place]) 10/21/2018   Nocturnal hypoxemia 10/21/2018   Radiation therapy complication [id number]   Primary prostate cancer ([place]) 07/31/2017   Anemia 07/31/2017   Vertigo due to cerebrovascular disease 07/31/2017   Poor compliance with CPAP treatment 07/31/2017   Absolute anemia 05/16/2017   Avitaminosis D 05/16/2017   Benign essential HTN 05/16/2017   Benign prostatic hypertrophy  without  urinary obstruction 05/16/2017   Clinical depression 05/16/2017   CN (constipation) 05/16/2017   Current drug use 05/16/2017   Diabetic neuropathy ([place]) 05/16/2017   Genital herpes 05/16/2017   Infarction of lung due to iatrogenic pulmonary embolism ([place]) 05/16/2017   Sciatica associated with disorder of lumbar spine 05/16/2017   Arteriosclerosis of coronary artery 05/16/2017   Artery disease, cerebral 05/16/2017   Apnea, sleep 05/16/2017   Arthralgia of hip or thigh 05/16/2017   Malignant neoplasm of prostate ([place]) 09/28/2016   Vascular dementia in remission ([place]) 09/28/2016   Remote history of stroke 09/28/2016   Acute kidney injury ([place]) 05/18/2016   Dehydration 05/18/2016   Near syncope 05/18/2016   Orthostatic hypotension 05/18/2016   CKD (chronic kidney disease), stage II 04/26/2016   UTI (urinary tract infection) 04/26/2016   Encounter for counseling on use of CPAP 11/18/2015   Chronic cholecystitis with calculus 11/02/2015   Stroke, 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[place]) 04/22/2015   TIA (transient ischemic attack) 11/03/2014   OSA on CPAP 05/18/2014   White matter disease 08/14/2013   Abnormal x-ray of temporomandibular joint 05/30/2013   Chronic infection of sinus 05/30/2013   Cough 05/30/2013   Fatigue 05/30/2013   Unsteady gait 05/19/2013   Combined fat and carbohydrate induced hyperlipemia 04/24/2013   Syncope 03/20/2013   Angina pectoris ([place]) 10/08/2012   Hypertension    Stroke ([place])    History of TIAs    Lumbago    Other and unspecified hyperlipidemia    Personal history of unspecified circulatory disease    Unwitnessed fall    Pain in joint, multiple sites    Degeneration of cervical intervertebral disc    Unspecified cardiovascular disease    History of pulmonary embolism: June 2014,  Takes Xarelto 07/01/2012    Class: History of   Hemiparesis ([place]) 07/18/2011   Diabetes mellitus type 2 with complications ([place]) [id number]   CAD in native artery 07/18/2011   History  of recurrent TIAs 07/18/2011    Rico Junker, PT, DPT 08/17/20    12:21 PM    [facility] [address] [place], [place], [address] Phone: [phone]   Fax:  [phone]  Name: Steve Brown MRN: [id number] Date of Birth: 1940/01/14

## 2020-08-18 DIAGNOSIS — M79606 Pain in leg, unspecified: Secondary | ICD-10-CM | POA: Diagnosis not present

## 2020-08-18 DIAGNOSIS — F329 Major depressive disorder, single episode, unspecified: Secondary | ICD-10-CM | POA: Diagnosis not present

## 2020-08-18 DIAGNOSIS — I2510 Atherosclerotic heart disease of native coronary artery without angina pectoris: Secondary | ICD-10-CM | POA: Diagnosis not present

## 2020-08-18 DIAGNOSIS — N183 Chronic kidney disease, stage 3 unspecified: Secondary | ICD-10-CM | POA: Diagnosis not present

## 2020-08-18 DIAGNOSIS — R410 Disorientation, unspecified: Secondary | ICD-10-CM | POA: Diagnosis not present

## 2020-08-18 DIAGNOSIS — E1140 Type 2 diabetes mellitus with diabetic neuropathy, unspecified: Secondary | ICD-10-CM | POA: Diagnosis not present

## 2020-08-18 DIAGNOSIS — I208 Other forms of angina pectoris: Secondary | ICD-10-CM | POA: Diagnosis not present

## 2020-08-18 DIAGNOSIS — K219 Gastro-esophageal reflux disease without esophagitis: Secondary | ICD-10-CM | POA: Diagnosis not present

## 2020-08-18 DIAGNOSIS — E782 Mixed hyperlipidemia: Secondary | ICD-10-CM | POA: Diagnosis not present

## 2020-08-18 DIAGNOSIS — E559 Vitamin D deficiency, unspecified: Secondary | ICD-10-CM | POA: Diagnosis not present

## 2020-08-18 DIAGNOSIS — I10 Essential (primary) hypertension: Secondary | ICD-10-CM | POA: Diagnosis not present

## 2020-08-18 DIAGNOSIS — N390 Urinary tract infection, site not specified: Secondary | ICD-10-CM | POA: Diagnosis not present

## 2020-08-18 DIAGNOSIS — C61 Malignant neoplasm of prostate: Secondary | ICD-10-CM | POA: Diagnosis not present

## 2020-08-18 DIAGNOSIS — I209 Angina pectoris, unspecified: Secondary | ICD-10-CM | POA: Diagnosis not present

## 2020-08-18 DIAGNOSIS — N400 Enlarged prostate without lower urinary tract symptoms: Secondary | ICD-10-CM | POA: Diagnosis not present

## 2020-08-18 DIAGNOSIS — Z09 Encounter for follow-up examination after completed treatment for conditions other than malignant neoplasm: Secondary | ICD-10-CM | POA: Diagnosis not present

## 2020-08-18 DIAGNOSIS — D649 Anemia, unspecified: Secondary | ICD-10-CM | POA: Diagnosis not present

## 2020-08-20 DIAGNOSIS — R059 Cough, unspecified: Secondary | ICD-10-CM | POA: Diagnosis not present

## 2020-08-20 DIAGNOSIS — R42 Dizziness and giddiness: Secondary | ICD-10-CM | POA: Diagnosis not present

## 2020-08-20 DIAGNOSIS — Z794 Long term (current) use of insulin: Secondary | ICD-10-CM | POA: Diagnosis not present

## 2020-08-20 DIAGNOSIS — E559 Vitamin D deficiency, unspecified: Secondary | ICD-10-CM | POA: Diagnosis not present

## 2020-08-20 DIAGNOSIS — E1140 Type 2 diabetes mellitus with diabetic neuropathy, unspecified: Secondary | ICD-10-CM | POA: Diagnosis not present

## 2020-08-20 DIAGNOSIS — I10 Essential (primary) hypertension: Secondary | ICD-10-CM | POA: Diagnosis not present

## 2020-08-23 ENCOUNTER — Encounter: Payer: Self-pay | Admitting: Adult Health

## 2020-08-23 ENCOUNTER — Ambulatory Visit (INDEPENDENT_AMBULATORY_CARE_PROVIDER_SITE_OTHER): Payer: Medicare Other | Admitting: Adult Health

## 2020-08-23 ENCOUNTER — Telehealth: Payer: Self-pay | Admitting: *Deleted

## 2020-08-23 VITALS — BP 153/75 | HR 58 | Ht 65.0 in | Wt 207.0 lb

## 2020-08-23 DIAGNOSIS — Z9989 Dependence on other enabling machines and devices: Secondary | ICD-10-CM | POA: Diagnosis not present

## 2020-08-23 DIAGNOSIS — G4733 Obstructive sleep apnea (adult) (pediatric): Secondary | ICD-10-CM | POA: Diagnosis not present

## 2020-08-23 DIAGNOSIS — R5383 Other fatigue: Secondary | ICD-10-CM | POA: Diagnosis not present

## 2020-08-23 DIAGNOSIS — I2510 Atherosclerotic heart disease of native coronary artery without angina pectoris: Secondary | ICD-10-CM

## 2020-08-23 MED ORDER — MODAFINIL 100 MG PO TABS: 100.0000 mg | ORAL_TABLET | Freq: Every day | ORAL | 5 refills | Status: DC

## 2020-08-23 NOTE — Progress Notes (Signed)
PATIENT: Steve Brown DOB: 02/10/1940  REASON FOR VISIT: follow up HISTORY FROM: patient Primary neurologist: Dr. Brett Fairy  HISTORY OF PRESENT ILLNESS: Today 08/23/20:  Steve Brown is a 80 year old male with a history of obstructive sleep apnea on CPAP.  He returns today for follow-up.  At the last visit he was given a prescription of Provigil to be taken daily for daytime sleepiness and fatigue.  He reports that he does not recall this being prescribed and does not recall taking it.  Patient CPAP report is below.  He reports that the mask does leak but this has been the best fitting mask he has had.  He does report he was recently diagnosed with a urinary tract infection that put him in the hospital for 1 week and then in rehab for 1 week.  He is now back at home.  He has noticed some cognitive changes from this.  He lives at home alone.  Operates a motor vehicle without difficulty.  Reports that he can complete all ADLs independently.  Reports that he still feels tired throughout the day.  Would be interested in trying Provigil.     11/30/19: Steve Brown is a 80 year old male with a history of obstructive sleep apnea on CPAP.  He returns today for follow-up.  Download indicates that he uses machine 28 out of 30 days for compliance of 93%.  He uses machine greater than 4 hours 26 days for compliance of 87%.  On average he uses his machine 6 hours and 15 minutes.  Residual AHI is 5.5 on 13 cm of water with EPR 1.  Leak in the 95th percentile is 64.5 liters per minute. He reports that he does feel the mask leaking at night. He currently has a full facemask.  Patient reports that he continues to struggle with fatigue. Reports that he does like to go to the gym but finds that he cannot do much due to lack of energy. States that he has a hard time driving longer distances due to fatigue. He has had a full work-up with his primary care and cardiologist and reports that they cannot find any causes  of his fatigue. He returns today for an evaluation.  11/28/18:   Steve Brown is a 80 year old male with a history of obstructive sleep apnea on CPAP.  He returns today for follow-up.  His download indicates that he uses machine nightly for compliance of 100%.  He uses machine greater than 4 hours 29 days for compliance of 97%.  On average he uses his machine 5 hours and 50 minutes.  His residual AHI is 1.1 on 13 cm of water with EPR of 1.  His leak in the 95th percentile is 39.2 L/min.  He reports that the CPAP is working well.  He has noticed the benefit.  On occasion he may feel the mask leaking but this is not the norm.  He returns today for an evaluation. HISTORY   REVIEW OF SYSTEMS: Out of a complete 14 system review of symptoms, the patient complains only of the following symptoms, and all other reviewed systems are negative.  FSS 46  ESS 8  ALLERGIES: Allergies  Allergen Reactions   Other Other (See Comments)    Per patient- cardiac cath dye-  "woke up during procedure hysterical."   Phenergan [Promethazine] Other (See Comments)    Mood changes    Iohexol Other (See Comments)    Patient refuses contrast after having a hysterical event in hospital //  r ls spoke with patient    Iohexol    Phenergan [Promethazine]     Other reaction(s): syncope   Tramadol     HOME MEDICATIONS: Outpatient Medications Prior to Visit  Medication Sig Dispense Refill   acetaminophen (TYLENOL) 500 MG tablet Take 500 mg by mouth every 6 (six) hours as needed (pain).     bisacodyl (DULCOLAX) 10 MG suppository Place 1 suppository (10 mg total) rectally daily as needed for moderate constipation. 12 suppository 0   diclofenac Sodium (VOLTAREN) 1 % GEL Apply 4 g topically 4 (four) times daily. 100 g 0   fluticasone (FLONASE) 50 MCG/ACT nasal spray Place 1 spray into both nostrils daily as needed for allergies. 16 g 0   furosemide (LASIX) 20 MG tablet Take 1-2 tablets (20-40 mg total) by mouth daily as needed  for fluid. 30 tablet 0   hydrochlorothiazide (MICROZIDE) 12.5 MG capsule Take 1 capsule (12.5 mg total) by mouth daily. 30 capsule 0   insulin aspart (NOVOLOG) 100 UNIT/ML FlexPen Inject 5 Units into the skin 3 (three) times daily with meals. 15 mL 0   losartan (COZAAR) 100 MG tablet Take 1 tablet (100 mg total) by mouth daily. 30 tablet 0   methocarbamol (ROBAXIN) 500 MG tablet Take 1 tablet (500 mg total) by mouth every 8 (eight) hours as needed for muscle spasms. 30 tablet 0   oxyCODONE (OXY IR/ROXICODONE) 5 MG immediate release tablet Take 1 tablet (5 mg total) by mouth every 6 (six) hours as needed for up to 6 doses for severe pain. 30 tablet 0   pantoprazole (PROTONIX) 40 MG tablet Take 1 tablet (40 mg total) by mouth every morning. 30 tablet 0   polyethylene glycol (MIRALAX / GLYCOLAX) 17 g packet Take 17 g by mouth daily. 14 each 0   pregabalin (LYRICA) 300 MG capsule Take 300 mg by mouth 2 (two) times daily.     rosuvastatin (CRESTOR) 10 MG tablet Take 1 tablet (10 mg total) by mouth daily. 30 tablet 0   sitaGLIPtin (JANUVIA) 50 MG tablet Take 1 tablet (50 mg total) by mouth daily. 30 tablet 0   TRESIBA FLEXTOUCH 100 UNIT/ML FlexTouch Pen Inject 34 Units into the skin daily. 3 mL 0   XARELTO 20 MG TABS tablet Take 1 tablet (20 mg total) by mouth daily. 30 tablet 0   lidocaine (LIDODERM) 5 % Place 1 patch onto the skin daily. Remove & Discard patch within 12 hours or as directed by MD 30 patch 0   No facility-administered medications prior to visit.    PAST MEDICAL HISTORY: Past Medical History:  Diagnosis Date   Abnormal prostate biopsy    Anticoagulant long-term use    currently xarelto   BPH with elevated PSA    CKD (chronic kidney disease), stage II    Complication of anesthesia    limted neck rom limited use of left arm due to cva   Coronary artery disease    CARDIOLOGIST-  DR Irish Lack--  2010-- PCI w/ stenting midLAD   DDD (degenerative disc disease), lumbar     Degeneration of cervical intervertebral disc    Depression    Diabetes mellitus without complication (HCC)    Dyspnea on exertion    GERD (gastroesophageal reflux disease)    Hemiparesis due to cerebral infarction    History of cerebrovascular accident (CVA) with residual deficit 2002 and 2003--  hemiparisis both sides   per MRI  anterior left frontal lobe, left para midline  pons, and inferior cerebullam bilaterally infarcts   History of pulmonary embolus (PE)    06-30-2012  extensive bilaterally   History of recurrent TIAs    History of syncope    hx multiple pre-syncope and syncopal episodes due to vasovagal, orthostatic hypotension, dehydration   History of TIAs    several since 2002   Hyperlipidemia    Hypertension    Mild atherosclerosis of carotid artery, bilateral    per last duplex 11-04-2014  bilateral ICA 1--39%   Neuropathy    fingers   OSA on CPAP    followed by dr dohmeier--  sev. osa w/ AHI 65.9   Prostate cancer ([place]) dx 2018   Renal insufficiency    S/P coronary artery stent placement 2010   stenting to mid LAD   Simple renal cyst    bilaterally   Stroke [facility])    Trigger finger of both hands 11-17-13   Type 2 diabetes mellitus ([place]) dx 1986   last one A1c 9.2 on 04-26-2016   Unsteady gait    . Hx prior CVA/TIAs;   Vertebral artery occlusion, left    chronic    PAST SURGICAL HISTORY: Past Surgical History:  Procedure Laterality Date   ANTERIOR CERVICAL DECOMP/DISCECTOMY FUSION  2004   C3 -- C6 limited rom   CARDIAC CATHETERIZATION  06-10-2010   dr Irish Lack   wide patent LAD stent, mid lesion at the origin of the septal prior to the previous stent 40-50%/  normal LVF, ef 55%   CARDIOVASCULAR STRESS TEST  10-23-2012  dr Irish Lack   normal nuclear perfusion study w/ no ischemia/  normal LV function and wall motion , ef 65%   CARPAL TUNNEL RELEASE Bilateral    CATARACT EXTRACTION W/ INTRAOCULAR LENS  IMPLANT, BILATERAL     CHOLECYSTECTOMY N/A 11/02/2015    Procedure: LAPAROSCOPIC CHOLECYSTECTOMY WITH INTRAOPERATIVE CHOLANGIOGRAM;  Surgeon: Donnie Mesa, MD;  Location: [facility];  Service: General;  Laterality: N/A;   COLONOSCOPY     CORONARY ANGIOPLASTY WITH STENT PLACEMENT  02/2008   stenting to mid LAD   GOLD SEED IMPLANT N/A 11/15/2016   Procedure: [place];  Surgeon: Ardis Hughs, MD;  Location: [facility];  Service: Urology;  Laterality: N/A;   IR ANGIO INTRA EXTRACRAN SEL COM CAROTID INNOMINATE BILAT MOD SED  06/13/2018   IR ANGIO VERTEBRAL SEL VERTEBRAL UNI R MOD SED  06/13/2018   IR US GUIDE VASC ACCESS RIGHT  06/13/2018   LEFT HEART CATH AND CORONARY ANGIOGRAPHY N/A 05/25/2017   Procedure: LEFT HEART CATH AND CORONARY ANGIOGRAPHY;  Surgeon: Jettie Booze, MD;  Location: [place] [facility];  Service: Cardiovascular;  Laterality: N/A;   LEFT HEART CATHETERIZATION WITH CORONARY ANGIOGRAM N/A 04/03/2013   Procedure: LEFT HEART CATHETERIZATION WITH CORONARY ANGIOGRAM;  Surgeon: Jettie Booze, MD;  Location: [facility] CATH LAB;  Service: Cardiovascular;  Laterality: N/A;  patent mLAD stent  w/ mild disease in remainder LAD and its branches;  mod. focal lesion midLCFx- FFR of lesion was negative for ischemia/  normal LVSF, ef 50%   lungs  2005   "fluid pumped off lungs"   NEUROPLASTY / TRANSPOSITION ULNAR NERVE AT ELBOW Right 2004   PROSTATE BIOPSY N/A 08/31/2016   Procedure: PROSTATE  BIOPSY TRANSRECTAL ULTRASONIC PROSTATE (TUBP);  Surgeon: Ardis Hughs, MD;  Location: [facility];  Service: Urology;  Laterality: N/A;   SPACE OAR INSTILLATION N/A 11/15/2016   Procedure: SPACE OAR INSTILLATION;  Surgeon: Ardis Hughs, MD;  Location: [facility];  Service: Urology;  Laterality: N/A;   TRANSTHORACIC ECHOCARDIOGRAM  04/27/2016   severe focal basal LVH, ef 60-65%,  grade 2 diastoilc dysfunction/  mild AR, MR, and TR/  atrial septum lipomatous hypertrophy/  PASP [id number]    UMBILICAL HERNIA REPAIR      FAMILY HISTORY: Family History  Problem Relation Age of Onset   Aneurysm Mother    Cancer Father        unknown either pancreatic or prostate   Stroke Brother    Dementia Sister    Heart attack Neg Hx     SOCIAL HISTORY: Social History   Socioeconomic History   Marital status: Single    Spouse name: Not on file   Number of children: 4   Years of education: 12   Highest education level: Not on file  Occupational History    Employer: RETIRED    Comment: retired  Tobacco Use   Smoking status: Never   Smokeless tobacco: Never  Vaping Use   Vaping Use: Never used  Substance and Sexual Activity   Alcohol use: Not Currently   Drug use: Never   Sexual activity: Not Currently  Other Topics Concern   Not on file  Social History Narrative   ** Merged History Encounter **       Patient lives at home alone and he is single.  Patient is retired.  Caffeine - one cups daily. Right handed. Patient has a high school education. Patient has four adult children.   Social Determinants of Health   Financial Resource Strain: Not on file  Food Insecurity: No Food Insecurity   Worried About Charity fundraiser in the Last Year: Never true   Ran Out of Food in the Last Year: Never true  Transportation Needs: No Transportation Needs   Lack of Transportation (Medical): No   Lack of Transportation (Non-Medical): No  Physical Activity: Inactive   Days of Exercise per Week: 0 days   Minutes of Exercise per Session: 0 min  Stress: Not on file  Social Connections: Not on file  Intimate Partner Violence: Not on file      PHYSICAL EXAM  Vitals:   08/23/20 1056  BP: (!) 153/75  Pulse: (!) 58  Weight: 207 lb (93.9 kg)  Height: '5\' 5"'$  (1.651 m)   Body mass index is 34.45 kg/m.  Generalized: Well developed, in no acute distress  Chest: Lungs clear to auscultation bilaterally  Neurological examination  Mentation: Alert oriented to time, place,  history taking. Follows all commands speech and language fluent Cranial nerve II-XII: Extraocular movements were full, visual field were full on confrontational test Head turning and shoulder shrug  were normal and symmetric. Motor: The motor testing reveals 5 over 5 strength of all 4 extremities. Good symmetric motor tone is noted throughout.  Sensory: Sensory testing is intact to soft touch on all 4 extremities. No evidence of extinction is noted.  Gait and station: Gait is normal.    DIAGNOSTIC DATA (LABS, IMAGING, TESTING) - I reviewed patient records, labs, notes, testing and imaging myself where available.  Lab Results  Component Value Date   WBC 5.1 06/07/2020   HGB 12.4 (A) 06/07/2020   HCT 37 (A) 06/07/2020   MCV 91.7 05/29/2020   PLT 323 06/07/2020      Component Value Date/Time   NA 138 06/07/2020 0000   NA 140 08/14/2012 1513   K 4.5 06/07/2020  0000   K 4.3 08/14/2012 1513   CL 100 06/07/2020 0000   CO2 28 (A) 06/07/2020 0000   CO2 27 08/14/2012 1513   GLUCOSE 168 (H) 05/27/2020 0143   GLUCOSE 262 (H) 08/14/2012 1513   BUN 22 (A) 06/07/2020 0000   BUN 19.0 08/14/2012 1513   CREATININE 1.4 (A) 06/07/2020 0000   CREATININE 1.18 05/27/2020 0143   CREATININE 1.29 03/19/2014 1130   CREATININE 1.3 08/14/2012 1513   CALCIUM 9.4 06/07/2020 0000   CALCIUM 9.3 08/14/2012 1513   PROT 6.2 (L) 05/27/2020 0143   PROT 6.5 05/27/2020 0143   PROT 6.3 11/24/2019 0944   PROT 6.9 08/14/2012 1513   ALBUMIN 2.9 (L) 05/27/2020 0143   ALBUMIN 2.9 (L) 05/27/2020 0143   ALBUMIN 4.0 11/24/2019 0944   ALBUMIN 3.4 (L) 08/14/2012 1513   AST 22 05/27/2020 0143   AST 23 05/27/2020 0143   AST 15 08/14/2012 1513   ALT 19 05/27/2020 0143   ALT 19 05/27/2020 0143   ALT 14 08/14/2012 1513   ALKPHOS 47 05/27/2020 0143   ALKPHOS 48 05/27/2020 0143   ALKPHOS 92 08/14/2012 1513   BILITOT 0.8 05/27/2020 0143   BILITOT 1.0 05/27/2020 0143   BILITOT 0.6 11/24/2019 0944   BILITOT 0.39  08/14/2012 1513   GFRNONAA 49.43 06/07/2020 0000   GFRNONAA >60 05/27/2020 0143   GFRNONAA 67 05/19/2013 1108   GFRAA 57.29 06/07/2020 0000   GFRAA 78 05/19/2013 1108   Lab Results  Component Value Date   CHOL 139 11/24/2019   HDL 53 11/24/2019   LDLCALC 72 11/24/2019   TRIG 70 11/24/2019   CHOLHDL 2.6 11/24/2019   Lab Results  Component Value Date   HGBA1C 7.5 (H) 05/26/2020   Lab Results  Component Value Date   VITAMINB12 480 05/26/2020   Lab Results  Component Value Date   TSH 1.731 05/26/2020      ASSESSMENT AND PLAN 80 y.o. year old male  has a past medical history of Abnormal prostate biopsy, Anticoagulant long-term use, BPH with elevated PSA, CKD (chronic kidney disease), stage II, Complication of anesthesia, Coronary artery disease, DDD (degenerative disc disease), lumbar, Degeneration of cervical intervertebral disc, Depression, Diabetes mellitus without complication ([place]), Dyspnea on exertion, GERD (gastroesophageal reflux disease), Hemiparesis due to cerebral infarction, History of cerebrovascular accident (CVA) with residual deficit (2002 and 2003--  hemiparisis both sides), History of pulmonary embolus (PE), History of recurrent TIAs, History of syncope, History of TIAs, Hyperlipidemia, Hypertension, Mild atherosclerosis of carotid artery, bilateral, Neuropathy, OSA on CPAP, Prostate cancer ([place]) (dx 2018), Renal insufficiency, S/P coronary artery stent placement (2010), Simple renal cyst, Stroke ([place]), Trigger finger of both hands (11-17-13), Type 2 diabetes mellitus ([place]) (dx 1986), Unsteady gait, and Vertebral artery occlusion, left. here with:  OSA on CPAP  - CPAP compliance excellent - Good treatment of AHI  - Mask refitting due to high leak - Encourage patient to use CPAP nightly and > 4 hours each night - MMSE 25/30 we will continue to monitor   2. Daytime sleepiness and fatigue  - Try Provigil/modafinil 100 mg daily -Reviewed side effects with the  patient.  Follow-up in 6 months or sooner if needed   Ward Givens, MSN, NP-C 08/23/2020, 11:05 AM [facility] [address], [place], [place] [address] [phone]

## 2020-08-23 NOTE — Telephone Encounter (Deleted)
[  11:03 AM] Star Age I will need to get blood test results for Berton Mount from May,   [11:03 AM] Star Age PCP: Raelyn Number, PA  [11:03 AM] Star Age Pls request

## 2020-08-23 NOTE — Telephone Encounter (Signed)
Error

## 2020-08-23 NOTE — Patient Instructions (Addendum)
Continue using CPAP nightly and greater than 4 hours each night. Look to see if you tried Provigil? If your symptoms worsen or you develop new symptoms please let us know.

## 2020-08-24 ENCOUNTER — Other Ambulatory Visit: Payer: Self-pay

## 2020-08-24 ENCOUNTER — Ambulatory Visit: Payer: Medicare Other | Admitting: Physical Therapy

## 2020-08-24 DIAGNOSIS — R2689 Other abnormalities of gait and mobility: Secondary | ICD-10-CM | POA: Diagnosis not present

## 2020-08-24 DIAGNOSIS — M6281 Muscle weakness (generalized): Secondary | ICD-10-CM | POA: Diagnosis not present

## 2020-08-24 DIAGNOSIS — M79605 Pain in left leg: Secondary | ICD-10-CM

## 2020-08-24 DIAGNOSIS — M5432 Sciatica, left side: Secondary | ICD-10-CM | POA: Diagnosis not present

## 2020-08-24 DIAGNOSIS — R208 Other disturbances of skin sensation: Secondary | ICD-10-CM | POA: Diagnosis not present

## 2020-08-24 NOTE — Therapy (Signed)
[facility] [address] [place], [place], [address] Phone: [phone]   Fax:  [phone]  Physical Therapy Treatment  Patient Details  Name: Steve Brown MRN: [id number] Date of Birth: Sep 09, 1940 Referring Provider (PT): Lawerance Cruel, MD   Encounter Date: 08/24/2020   PT End of Session - 08/24/20 1407     Visit Number 2    Number of Visits 17    Date for PT Re-Evaluation 10/15/20    Authorization Type Medicare A&B; 10th visit PN    Progress Note Due on Visit 10    PT Start Time 1402    PT Stop Time 1452    PT Time Calculation (min) 50 min    Activity Tolerance Patient tolerated treatment well    Behavior During Therapy [facility] for tasks assessed/performed             Past Medical History:  Diagnosis Date   Abnormal prostate biopsy    Anticoagulant long-term use    currently xarelto   BPH with elevated PSA    CKD (chronic kidney disease), stage II    Complication of anesthesia    limted neck rom limited use of left arm due to cva   Coronary artery disease    CARDIOLOGIST-  DR Irish Lack--  2010-- PCI w/ stenting midLAD   DDD (degenerative disc disease), lumbar    Degeneration of cervical intervertebral disc    Depression    Diabetes mellitus without complication (HCC)    Dyspnea on exertion    GERD (gastroesophageal reflux disease)    Hemiparesis due to cerebral infarction    History of cerebrovascular accident (CVA) with residual deficit 2002 and 2003--  hemiparisis both sides   per MRI  anterior left frontal lobe, left para midline pons, and inferior cerebullam bilaterally infarcts   History of pulmonary embolus (PE)    06-30-2012  extensive bilaterally   History of recurrent TIAs    History of syncope    hx multiple pre-syncope and syncopal episodes due to vasovagal, orthostatic hypotension, dehydration   History of TIAs    several since 2002   Hyperlipidemia    Hypertension    Mild  atherosclerosis of carotid artery, bilateral    per last duplex 11-04-2014  bilateral ICA 1--39%   Neuropathy    fingers   OSA on CPAP    followed by dr dohmeier--  sev. osa w/ AHI 65.9   Prostate cancer ([place]) dx 2018   Renal insufficiency    S/P coronary artery stent placement 2010   stenting to mid LAD   Simple renal cyst    bilaterally   Stroke [facility])    Trigger finger of both hands 11-17-13   Type 2 diabetes mellitus ([place]) dx 1986   last one A1c 9.2 on 04-26-2016   Unsteady gait    . Hx prior CVA/TIAs;   Vertebral artery occlusion, left    chronic    Past Surgical History:  Procedure Laterality Date   ANTERIOR CERVICAL DECOMP/DISCECTOMY FUSION  2004   C3 -- C6 limited rom   CARDIAC CATHETERIZATION  06-10-2010   dr Irish Lack   wide patent LAD stent, mid lesion at the origin of the septal prior to the previous stent 40-50%/  normal LVF, ef 55%   CARDIOVASCULAR STRESS TEST  10-23-2012  dr Irish Lack   normal nuclear perfusion study w/ no ischemia/  normal LV function and wall motion , ef 65%   CARPAL TUNNEL RELEASE Bilateral  CATARACT EXTRACTION W/ INTRAOCULAR LENS  IMPLANT, BILATERAL     CHOLECYSTECTOMY N/A 11/02/2015   Procedure: LAPAROSCOPIC CHOLECYSTECTOMY WITH INTRAOPERATIVE CHOLANGIOGRAM;  Surgeon: Donnie Mesa, MD;  Location: [facility];  Service: General;  Laterality: N/A;   COLONOSCOPY     CORONARY ANGIOPLASTY WITH STENT PLACEMENT  02/2008   stenting to mid LAD   GOLD SEED IMPLANT N/A 11/15/2016   Procedure: GOLD SEED IMPLANT [place];  Surgeon: Ardis Hughs, MD;  Location: [facility];  Service: Urology;  Laterality: N/A;   IR ANGIO INTRA EXTRACRAN SEL COM CAROTID INNOMINATE BILAT MOD SED  06/13/2018   IR ANGIO VERTEBRAL SEL VERTEBRAL UNI R MOD SED  06/13/2018   IR US GUIDE VASC ACCESS RIGHT  06/13/2018   LEFT HEART CATH AND CORONARY ANGIOGRAPHY N/A 05/25/2017   Procedure: LEFT HEART CATH AND CORONARY ANGIOGRAPHY;  Surgeon: Jettie Booze, MD;   Location: [facility] CV LAB;  Service: Cardiovascular;  Laterality: N/A;   LEFT HEART CATHETERIZATION WITH CORONARY ANGIOGRAM N/A 04/03/2013   Procedure: LEFT HEART CATHETERIZATION WITH CORONARY ANGIOGRAM;  Surgeon: Jettie Booze, MD;  Location: [facility] CATH LAB;  Service: Cardiovascular;  Laterality: N/A;  patent mLAD stent  w/ mild disease in remainder LAD and its branches;  mod. focal lesion midLCFx- FFR of lesion was negative for ischemia/  normal LVSF, ef 50%   lungs  2005   "fluid pumped off lungs"   NEUROPLASTY / TRANSPOSITION ULNAR NERVE AT ELBOW Right 2004   PROSTATE BIOPSY N/A 08/31/2016   Procedure: PROSTATE  BIOPSY TRANSRECTAL ULTRASONIC PROSTATE (TUBP);  Surgeon: Ardis Hughs, MD;  Location: [facility];  Service: Urology;  Laterality: N/A;   SPACE OAR INSTILLATION N/A 11/15/2016   Procedure: SPACE OAR INSTILLATION;  Surgeon: Ardis Hughs, MD;  Location: [facility];  Service: Urology;  Laterality: N/A;   TRANSTHORACIC ECHOCARDIOGRAM  04/27/2016   severe focal basal LVH, ef 60-65%,  grade 2 diastoilc dysfunction/  mild AR, MR, and TR/  atrial septum lipomatous hypertrophy/  PASP [id number]   UMBILICAL HERNIA REPAIR      There were no vitals filed for this visit.   Subjective Assessment - 08/24/20 1404     Subjective Pt states he is still having problems with his left leg and sciatica radiating down the leg; went [facility] the [facility] yesterday and rode the bike for 2500 steps    Pertinent History Long-term anticoagulant use, BPH with elevated PSA, CKD, CAD, DDD lumbar, degeneration of cervical intervertebral disc, depression, DM, DOE, hemiparesis due to CVA, multiple CVA, ACDF C3-C6, bilat carpal tunnel release, PE, h/o syncope, h/o TIA, HLD, HTN, neuropathy, OSA, prostate CA, Vertebral artery occlusion, neuroplasty/transposition ulnar 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 Yes    Pain Score 7     Pain Location Leg    Pain Orientation Left    Pain Descriptors / Indicators Radiating;Shooting    Pain Type Chronic pain    Pain Onset More than a month ago    Pain Frequency Constant   pt reports intensity varies but is constant                              [facility] Adult PT Treatment/Exercise - 08/24/20 1414       Exercises   Exercises Knee/Hip  Lumbar Exercises: Stretches   Pelvic Tilt 10 reps   2 sec hold     Knee/Hip Exercises: Stretches   Active Hamstring Stretch Right;Left;1 rep;30 seconds   Runner's stretch on 2nd step - each leg   Passive Hamstring Stretch Left;1 rep;30 seconds    Gastroc Stretch Both;1 rep;30 seconds   heels off edge of step     Knee/Hip Exercises: Aerobic   Recumbent Bike SciFit 5" level 4.0 with UE's and LE's      Knee/Hip Exercises: Seated   Sit to Sand without UE support   1 rep only - pt had no difficulty with transfer     Knee/Hip Exercises: Supine   Bridges AROM;Both;1 set;10 reps    Single Leg Bridge AROM;Left;1 set;10 reps    Other Supine Knee/Hip Exercises Lt hip extension control off side of mat table with 3# weight 10 reps    Other Supine Knee/Hip Exercises Lt hip flexor stretch off side of table 3 reps 30 sec hold      Knee/Hip Exercises: Sidelying   Hip ABduction AROM;Left;1 set;10 reps    Clams LLE 3# weight 10 reps    Other Sidelying Knee/Hip Exercises Lt hip flexor stretch in Rt sidelying position            Pt performed bridging with RLE extension 5 reps (difficulty with this exercise)  Step ups onto 6" step with LLE with 1 UE support 5 reps       PT Education - 08/24/20 1509     Education Details pt instructed to continue with HEP as previously instructed by primary PT    Person(s) Educated Patient    Methods Explanation    Comprehension Verbalized understanding              PT Short Term Goals - 08/24/20 1514       PT SHORT TERM GOAL #1   Title Pt  will demonstrate independence with updated and revised HEP    Time 4    Period Weeks    Status New    Target Date 09/16/20      PT SHORT TERM GOAL #2   Title Pt will demonstrate 10 degrees increase in L hamstring ROM and L hip flexor ROM    Baseline 60 deg hamstring, lacking 20 deg to neutral hip extension LLE Thomas test    Time 4    Period Weeks    Status New    Target Date 09/16/20      PT SHORT TERM GOAL #3   Title Pt will participate in stair negotiation assessment with LTG to be set    Baseline TBD    Time 4    Period Weeks    Status New    Target Date 09/16/20      PT SHORT TERM GOAL #4   Title Pt will decrease five time sit to stand to </= 15 seconds without UE support    Baseline 18.2 seconds    Time 4    Period Weeks    Status New    Target Date 09/16/20      PT SHORT TERM GOAL #5   Title Pt will increase gait velocity to >/= 2.8 ft/sec without AD and with decreased antalgic gait sequence    Baseline 2.46 ft/sec    Time 4    Period Weeks    Status New    Target Date 09/16/20  PT Long Term Goals - 08/24/20 1514       PT LONG TERM GOAL #1   Title Pt will be independent with final HEP and walking program at [facility]    Time 8    Period Weeks    Status New      PT LONG TERM GOAL #2   Title Pt will negotiate 16 stairs with one rail, alternating sequence MOD I    Time 8    Period Weeks    Status New      PT LONG TERM GOAL #3   Title Pt will demonstrate symmetrical hamstring length and hip extension (Thomas test) L to R side    Time 8    Period Weeks    Status New      PT LONG TERM GOAL #4   Title Pt will decrease five time sit to stand to </=13 seconds to indicate decreased falls risk    Time 8    Period Weeks    Status New      PT LONG TERM GOAL #5   Title Pt will increase gait velocity to >/= 3.0 ft/sec without AD    Time 8    Period Weeks    Status New                   Plan - 08/24/20 1511     Clinical  Impression Statement Session focused on Lt hip and hamstring stretching and Lt hip strengthening exercises.  Pt had difficulty performing piriformis stretch in supine position - needed min assist with flexing Rt hip to stretch Lt piriformis.  Pt tolerated exercises well with frequent short rest periods due to c/o fatigue.  Cont with POC.    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    Examination-Activity Limitations Bend;Locomotion Level;Stairs;Stand    Examination-Participation Restrictions Community Activity;Other   Fitness   Stability/Clinical Decision Making Evolving/Moderate complexity    Rehab Potential Good    PT Frequency 2x / week    PT Duration 8 weeks    PT Treatment/Interventions ADLs/Self Care Home Management;Aquatic Therapy;DME Instruction;Gait training;Stair training;Functional mobility training;Therapeutic activities;Therapeutic exercise;Balance training;Neuromuscular re-education;Patient/family education;Orthotic Fit/Training;Cryotherapy;Moist Heat;Manual techniques;Passive range of motion;Dry needling;Taping    PT Next Visit Plan Cont LLE strengthening, hamstring and L hip flexor stretching, lumbar mobility, standing balance.  Manual therapy for lumbar mobility.    PT Home Exercise Plan [id number]    Consulted and Agree with Plan of Care Patient             Patient will benefit from skilled therapeutic intervention in order to improve the following deficits and impairments:  Decreased activity tolerance, Decreased balance, Cardiopulmonary status limiting activity, Decreased  endurance, Decreased range of motion, Decreased strength, Difficulty walking, Impaired sensation, Postural dysfunction, Pain  Visit Diagnosis: Pain in left leg  Muscle weakness (generalized)     Problem List Patient Active Problem List   Diagnosis Date Noted   Syncope 05/26/2020   Back pain [id number]   Acute metabolic encephalopathy [id number]   Lumbar spinal stenosis 08/15/2019   Nonspecific chest pain 08/11/2019   Shortness of breath    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 ([place]) 10/21/2018  Nocturnal hypoxemia 10/21/2018   Radiation therapy complication [id number]   Primary prostate cancer ([place]) 07/31/2017   Anemia 07/31/2017   Vertigo due to cerebrovascular disease 07/31/2017   Poor compliance with CPAP treatment 07/31/2017   Absolute anemia 05/16/2017   Avitaminosis D 05/16/2017   Benign essential HTN 05/16/2017   Benign prostatic hypertrophy without urinary obstruction 05/16/2017   Clinical depression 05/16/2017   CN (constipation) 05/16/2017   Current drug use 05/16/2017   Diabetic neuropathy ([place]) 05/16/2017   Genital herpes 05/16/2017   Infarction of lung due to iatrogenic pulmonary embolism ([place]) 05/16/2017   Sciatica associated with disorder of lumbar spine 05/16/2017   Arteriosclerosis of coronary artery 05/16/2017   Artery disease, cerebral 05/16/2017   Apnea, sleep 05/16/2017   Arthralgia of hip or thigh 05/16/2017   Malignant neoplasm of prostate ([place]) 09/28/2016   Vascular dementia in remission ([place]) 09/28/2016   Remote history of stroke 09/28/2016   Acute kidney injury ([place]) 05/18/2016   Dehydration 05/18/2016   Near syncope 05/18/2016   Orthostatic hypotension 05/18/2016   CKD (chronic kidney disease), stage II 04/26/2016   UTI (urinary tract infection) 04/26/2016   Encounter for counseling on use of CPAP 11/18/2015   Chronic cholecystitis with calculus 11/02/2015   Stroke, 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[place]) 04/22/2015   TIA (transient  ischemic attack) 11/03/2014   OSA on CPAP 05/18/2014   White matter disease 08/14/2013   Abnormal x-ray of temporomandibular joint 05/30/2013   Chronic infection of sinus 05/30/2013   Cough 05/30/2013   Fatigue 05/30/2013   Unsteady gait 05/19/2013   Combined fat and carbohydrate induced hyperlipemia 04/24/2013   Syncope 03/20/2013   Angina pectoris ([place]) 10/08/2012   Hypertension    Stroke ([place])    History of TIAs    Lumbago    Other and unspecified hyperlipidemia    Personal history of unspecified circulatory disease    Unwitnessed fall    Pain in joint, multiple sites    Degeneration of cervical intervertebral disc    Unspecified cardiovascular disease    History of pulmonary embolism: June 2014,  Takes Xarelto 07/01/2012    Class: History of   Hemiparesis ([place]) 07/18/2011   Diabetes mellitus type 2 with complications ([place]) [id number]   CAD in native artery 07/18/2011   History of recurrent TIAs 07/18/2011    Alda Lea, PT 08/24/2020, 3:16 PM  [facility] [address] [place], [place], [address] Phone: [phone]   Fax:  [phone]  Name: Steve Brown MRN: [id number] Date of Birth: [birth year]

## 2020-08-26 DIAGNOSIS — I10 Essential (primary) hypertension: Secondary | ICD-10-CM | POA: Diagnosis not present

## 2020-08-26 DIAGNOSIS — E1165 Type 2 diabetes mellitus with hyperglycemia: Secondary | ICD-10-CM | POA: Diagnosis not present

## 2020-08-26 DIAGNOSIS — E7800 Pure hypercholesterolemia, unspecified: Secondary | ICD-10-CM | POA: Diagnosis not present

## 2020-08-27 ENCOUNTER — Other Ambulatory Visit: Payer: Self-pay

## 2020-08-27 ENCOUNTER — Ambulatory Visit: Payer: Medicare Other

## 2020-08-27 DIAGNOSIS — M6281 Muscle weakness (generalized): Secondary | ICD-10-CM | POA: Diagnosis not present

## 2020-08-27 DIAGNOSIS — M5432 Sciatica, left side: Secondary | ICD-10-CM

## 2020-08-27 DIAGNOSIS — M79605 Pain in left leg: Secondary | ICD-10-CM | POA: Diagnosis not present

## 2020-08-27 DIAGNOSIS — R208 Other disturbances of skin sensation: Secondary | ICD-10-CM | POA: Diagnosis not present

## 2020-08-27 DIAGNOSIS — R2689 Other abnormalities of gait and mobility: Secondary | ICD-10-CM | POA: Diagnosis not present

## 2020-08-27 NOTE — Therapy (Signed)
[facility] [address] [place], [place], [address] Phone: [phone]   Fax:  [phone]  Physical Therapy Treatment  Patient Details  Name: Steve Brown MRN: [id number] Date of Birth: 1940/08/25 Referring Provider (PT): Lawerance Cruel, MD   Encounter Date: 08/27/2020   PT End of Session - 08/27/20 1420     Visit Number 3    Number of Visits 17    Date for PT Re-Evaluation 10/15/20    Authorization Type Medicare A&B; 10th visit PN    Progress Note Due on Visit 10    PT Start Time 1405    PT Stop Time 4562    PT Time Calculation (min) 40 min    Equipment Utilized During Treatment Gait belt    Activity Tolerance Patient tolerated treatment well    Behavior During Therapy WFL for tasks assessed/performed             Past Medical History:  Diagnosis Date   Abnormal prostate biopsy    Anticoagulant long-term use    currently xarelto   BPH with elevated PSA    CKD (chronic kidney disease), stage II    Complication of anesthesia    limted neck rom limited use of left arm due to cva   Coronary artery disease    CARDIOLOGIST-  DR Irish Lack--  2010-- PCI w/ stenting midLAD   DDD (degenerative disc disease), lumbar    Degeneration of cervical intervertebral disc    Depression    Diabetes mellitus without complication (HCC)    Dyspnea on exertion    GERD (gastroesophageal reflux disease)    Hemiparesis due to cerebral infarction    History of cerebrovascular accident (CVA) with residual deficit 2002 and 2003--  hemiparisis both sides   per MRI  anterior left frontal lobe, left para midline pons, and inferior cerebullam bilaterally infarcts   History of pulmonary embolus (PE)    06-30-2012  extensive bilaterally   History of recurrent TIAs    History of syncope    hx multiple pre-syncope and syncopal episodes due to vasovagal, orthostatic hypotension, dehydration   History of TIAs    several since  2002   Hyperlipidemia    Hypertension    Mild atherosclerosis of carotid artery, bilateral    per last duplex 11-04-2014  bilateral ICA 1--39%   Neuropathy    fingers   OSA on CPAP    followed by dr dohmeier--  sev. osa w/ AHI 65.9   Prostate cancer ([place]) dx 2018   Renal insufficiency    S/P coronary artery stent placement 2010   stenting to mid LAD   Simple renal cyst    bilaterally   Stroke [facility])    Trigger finger of both hands 11-17-13   Type 2 diabetes mellitus ([place]) dx 1986   last one A1c 9.2 on 04-26-2016   Unsteady gait    . Hx prior CVA/TIAs;   Vertebral artery occlusion, left    chronic    Past Surgical History:  Procedure Laterality Date   ANTERIOR CERVICAL DECOMP/DISCECTOMY FUSION  2004   C3 -- C6 limited rom   CARDIAC CATHETERIZATION  06-10-2010   dr Irish Lack   wide patent LAD stent, mid lesion at the origin of the septal prior to the previous stent 40-50%/  normal LVF, ef 55%   CARDIOVASCULAR STRESS TEST  10-23-2012  dr Irish Lack   normal nuclear perfusion study w/ no ischemia/  normal LV function and wall motion ,  ef 65%   CARPAL TUNNEL RELEASE Bilateral    CATARACT EXTRACTION W/ INTRAOCULAR LENS  IMPLANT, BILATERAL     CHOLECYSTECTOMY N/A 11/02/2015   Procedure: LAPAROSCOPIC CHOLECYSTECTOMY WITH INTRAOPERATIVE CHOLANGIOGRAM;  Surgeon: Donnie Mesa, MD;  Location: [facility];  Service: General;  Laterality: N/A;   COLONOSCOPY     CORONARY ANGIOPLASTY WITH STENT PLACEMENT  02/2008   stenting to mid LAD   GOLD SEED IMPLANT N/A 11/15/2016   Procedure: GOLD SEED IMPLANT [place];  Surgeon: Ardis Hughs, MD;  Location: [facility];  Service: Urology;  Laterality: N/A;   IR ANGIO INTRA EXTRACRAN SEL COM CAROTID INNOMINATE BILAT MOD SED  06/13/2018   IR ANGIO VERTEBRAL SEL VERTEBRAL UNI R MOD SED  06/13/2018   IR US GUIDE VASC ACCESS RIGHT  06/13/2018   LEFT HEART CATH AND CORONARY ANGIOGRAPHY N/A 05/25/2017   Procedure: LEFT HEART CATH AND CORONARY  ANGIOGRAPHY;  Surgeon: Jettie Booze, MD;  Location: [facility];  Service: Cardiovascular;  Laterality: N/A;   LEFT HEART CATHETERIZATION WITH CORONARY ANGIOGRAM N/A 04/03/2013   Procedure: LEFT HEART CATHETERIZATION WITH CORONARY ANGIOGRAM;  Surgeon: Jettie Booze, MD;  Location[facility] CATH LAB;  Service: Cardiovascular;  Laterality: N/A;  patent mLAD stent  w/ mild disease in remainder LAD and its branches;  mod. focal lesion midLCFx- FFR of lesion was negative for ischemia/  normal LVSF, ef 50%   lungs  2005   "fluid pumped off lungs"   NEUROPLASTY / TRANSPOSITION ULNAR NERVE AT ELBOW Right 2004   PROSTATE BIOPSY N/A 08/31/2016   Procedure: PROSTATE  BIOPSY TRANSRECTAL ULTRASONIC PROSTATE (TUBP);  Surgeon: Ardis Hughs, MD;  Location: [facility];  Service: Urology;  Laterality: N/A;   SPACE OAR INSTILLATION N/A 11/15/2016   Procedure: SPACE OAR INSTILLATION;  Surgeon: Ardis Hughs, MD;  Location: [facility];  Service: Urology;  Laterality: N/A;   TRANSTHORACIC ECHOCARDIOGRAM  04/27/2016   severe focal basal LVH, ef 60-65%,  grade 2 diastoilc dysfunction/  mild AR, MR, and TR/  atrial septum lipomatous hypertrophy/  PASP 37SEGB   UMBILICAL HERNIA REPAIR      There were no vitals filed for this visit.   Subjective Assessment - 08/27/20 1407     Subjective Reports no new symptoms, felt treatment techniques from previous episodes were beneficial    Pertinent History Long-term anticoagulant use, BPH with elevated PSA, CKD, CAD, DDD lumbar, degeneration of cervical intervertebral disc, depression, DM, DOE, hemiparesis due to CVA, multiple CVA, ACDF C3-C6, bilat carpal tunnel release, PE, h/o syncope, h/o TIA, HLD, HTN, neuropathy, OSA, prostate CA, Vertebral artery occlusion, neuroplasty/transposition ulnar nn at elbow    Limitations Walking;Standing    How long can you sit comfortably? unimited    How long can you stand comfortably?  <15 minutes    How long can you walk comfortably? <15 minutes    Patient Stated Goals To relieve LLE pain, improve walking    Pain Onset More than a month ago                               [facility] Adult PT Treatment/Exercise - 08/27/20 0001       Transfers   Transfers Sit to Stand;Stand to Sit    Sit to Stand 6: Modified independent (Device/Increase time)    Stand to Sit 6: Modified independent (Device/Increase time)  Ambulation/Gait   Ambulation/Gait Yes    Ambulation/Gait Assistance 5: Supervision    Ambulation/Gait Assistance Details Decreased L hip extension    Ambulation Distance (Feet) 115 Feet    Assistive device None    Gait Pattern Antalgic;Decreased trunk rotation;Wide base of support;Decreased stride length;Decreased arm swing - left;Step-through pattern;Decreased arm swing - right    Ambulation Surface Level;Indoor      Lumbar Exercises: Stretches   Single Knee to Chest Stretch Right;Left;1 rep;30 seconds;Limitations    Single Knee to Chest Stretch Limitations PT performed    Piriformis Stretch Left;2 reps;30 seconds;Limitations    Piriformis Stretch Limitations patient performed      Lumbar Exercises: Supine   Ab Set 10 reps;Limitations    AB Set Limitations curl ups  2x10    Bridge Non-compliant;10 reps;Limitations    Bridge Limitations 2x10      Knee/Hip Exercises: Aerobic   Recumbent Bike SciFit 8" level 1.0 with UE's and LE's      Knee/Hip Exercises: Standing   Functional Squat 1 set;10 reps;Limitations    Functional Squat Limitations encouraged foot flat      Manual Therapy   Manual Therapy Muscle Energy Technique    Muscle Energy Technique MET to correcrt anterior rotation of R ilium, perfomed 3 x with goodresults, normalizing alignment                    PT Education - 08/27/20 1558     Education Details Previous HEP re-issued to patient    Person(s) Educated Patient    Methods Explanation;Handout     Comprehension Verbalized understanding;Returned demonstration              PT Short Term Goals - 08/24/20 1514       PT SHORT TERM GOAL #1   Title Pt will demonstrate independence with updated and revised HEP    Time 4    Period Weeks    Status New    Target Date 09/16/20      PT SHORT TERM GOAL #2   Title Pt will demonstrate 10 degrees increase in L hamstring ROM and L hip flexor ROM    Baseline 60 deg hamstring, lacking 20 deg to neutral hip extension LLE Thomas test    Time 4    Period Weeks    Status New    Target Date 09/16/20      PT SHORT TERM GOAL #3   Title Pt will participate in stair negotiation assessment with LTG to be set    Baseline TBD    Time 4    Period Weeks    Status New    Target Date 09/16/20      PT SHORT TERM GOAL #4   Title Pt will decrease five time sit to stand to </= 15 seconds without UE support    Baseline 18.2 seconds    Time 4    Period Weeks    Status New    Target Date 09/16/20      PT SHORT TERM GOAL #5   Title Pt will increase gait velocity to >/= 2.8 ft/sec without AD and with decreased antalgic gait sequence    Baseline 2.46 ft/sec    Time 4    Period Weeks    Status New    Target Date 09/16/20               PT Long Term Goals - 08/24/20 1514       PT LONG  TERM GOAL #1   Title Pt will be independent with final HEP and walking program at [facility]    Time 8    Period Weeks    Status New      PT LONG TERM GOAL #2   Title Pt will negotiate 16 stairs with one rail, alternating sequence MOD I    Time 8    Period Weeks    Status New      PT LONG TERM GOAL #3   Title Pt will demonstrate symmetrical hamstring length and hip extension (Thomas test) L to R side    Time 8    Period Weeks    Status New      PT LONG TERM GOAL #4   Title Pt will decrease five time sit to stand to </=13 seconds to indicate decreased falls risk    Time 8    Period Weeks    Status New      PT LONG TERM GOAL #5   Title Pt will  increase gait velocity to >/= 3.0 ft/sec without AD    Time 8    Period Weeks    Status New                   Plan - 08/27/20 1558     Clinical Impression Statement Todays session focused on pelvic alignment and review of HEP for stretching and core strengthening.  Pelvic and lumbosacral asessment finds an anterior rotation of R ilium, muscle energy technique restored pelvic alignment.  All activitied performed w/o increased pain.  L piriformis tightness evident along with SI dysfunction contribiuting to pain.    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    Examination-Activity Limitations Bend;Locomotion Level;Stairs;Stand    Examination-Participation Restrictions Community Activity;Other   Fitness   Stability/Clinical Decision Making Evolving/Moderate complexity    Rehab Potential Good    PT Frequency 2x / week    PT Duration 8 weeks    PT Treatment/Interventions ADLs/Self Care Home Management;Aquatic Therapy;DME Instruction;Gait training;Stair training;Functional mobility training;Therapeutic activities;Therapeutic exercise;Balance training;Neuromuscular re-education;Patient/family education;Orthotic Fit/Training;Cryotherapy;Moist Heat;Manual techniques;Passive range of motion;Dry needling;Taping    PT Next Visit Plan Cont LLE strengthening, hamstring and L hip flexor stretching, lumbar mobility, standing balance.  Manual therapy for lumbar mobility.  Assess SI alignment    PT Home Exercise Plan [id number]    Consulted and Agree with Plan of Care Patient             Patient will  benefit from skilled therapeutic intervention in order to improve the following deficits and impairments:  Decreased activity tolerance, Decreased balance, Cardiopulmonary status limiting activity, Decreased endurance, Decreased range of motion, Decreased strength, Difficulty walking, Impaired sensation, Postural dysfunction, Pain  Visit Diagnosis: Muscle weakness (generalized)  Sciatica, left side  Pain in left leg     Problem List Patient Active Problem List   Diagnosis Date Noted   Syncope 05/26/2020   Back pain 70/96/2836   Acute metabolic encephalopathy 62/94/7654   Lumbar spinal stenosis 08/15/2019   Nonspecific chest pain 08/11/2019   Shortness of breath    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 ([place]) 10/21/2018   Nocturnal hypoxemia 10/21/2018   Radiation  therapy complication 54/27/0623   Primary prostate cancer ([place]) 07/31/2017   Anemia 07/31/2017   Vertigo due to cerebrovascular disease 07/31/2017   Poor compliance with CPAP treatment 07/31/2017   Absolute anemia 05/16/2017   Avitaminosis D 05/16/2017   Benign essential HTN 05/16/2017   Benign prostatic hypertrophy without urinary obstruction 05/16/2017   Clinical depression 05/16/2017   CN (constipation) 05/16/2017   Current drug use 05/16/2017   Diabetic neuropathy ([place]) 05/16/2017   Genital herpes 05/16/2017   Infarction of lung due to iatrogenic pulmonary embolism ([place]) 05/16/2017   Sciatica associated with disorder of lumbar spine 05/16/2017   Arteriosclerosis of coronary artery 05/16/2017   Artery disease, cerebral 05/16/2017   Apnea, sleep 05/16/2017   Arthralgia of hip or thigh 05/16/2017   Malignant neoplasm of prostate ([place]) 09/28/2016   Vascular dementia in remission ([place]) 09/28/2016   Remote history of stroke 09/28/2016   Acute kidney injury ([place]) 05/18/2016   Dehydration 05/18/2016   Near syncope 05/18/2016   Orthostatic hypotension 05/18/2016   CKD (chronic kidney disease),  stage II 04/26/2016   UTI (urinary tract infection) 04/26/2016   Encounter for counseling on use of CPAP 11/18/2015   Chronic cholecystitis with calculus 11/02/2015   Stroke, 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[place]) 04/22/2015   TIA (transient ischemic attack) 11/03/2014   OSA on CPAP 05/18/2014   White matter disease 08/14/2013   Abnormal x-ray of temporomandibular joint 05/30/2013   Chronic infection of sinus 05/30/2013   Cough 05/30/2013   Fatigue 05/30/2013   Unsteady gait 05/19/2013   Combined fat and carbohydrate induced hyperlipemia 04/24/2013   Syncope 03/20/2013   Angina pectoris ([place]) 10/08/2012   Hypertension    Stroke ([place])    History of TIAs    Lumbago    Other and unspecified hyperlipidemia    Personal history of unspecified circulatory disease    Unwitnessed fall    Pain in joint, multiple sites    Degeneration of cervical intervertebral disc    Unspecified cardiovascular disease    History of pulmonary embolism: June 2014,  Takes Xarelto 07/01/2012    Class: History of   Hemiparesis ([place]) 07/18/2011   Diabetes mellitus type 2 with complications ([place]) 76/28/3151   CAD in native artery 07/18/2011   History of recurrent TIAs 07/18/2011    Lanice Shirts PT 08/27/2020, 4:14 PM  [facility] [address] [place], [place], [address] Phone: [phone]   Fax:  [phone]  Name: Tedford Berg MRN: [id number] Date of Birth: [birth year]

## 2020-08-30 ENCOUNTER — Other Ambulatory Visit: Payer: Self-pay

## 2020-08-30 ENCOUNTER — Ambulatory Visit: Payer: Medicare Other

## 2020-08-30 DIAGNOSIS — M5432 Sciatica, left side: Secondary | ICD-10-CM | POA: Diagnosis not present

## 2020-08-30 DIAGNOSIS — R2689 Other abnormalities of gait and mobility: Secondary | ICD-10-CM | POA: Diagnosis not present

## 2020-08-30 DIAGNOSIS — M79605 Pain in left leg: Secondary | ICD-10-CM | POA: Diagnosis not present

## 2020-08-30 DIAGNOSIS — M6281 Muscle weakness (generalized): Secondary | ICD-10-CM | POA: Diagnosis not present

## 2020-08-30 DIAGNOSIS — R208 Other disturbances of skin sensation: Secondary | ICD-10-CM | POA: Diagnosis not present

## 2020-08-30 NOTE — Therapy (Signed)
[facility] [address] [place], [place], [address] Phone: [phone]   Fax:  [phone]  Physical Therapy Treatment  Patient Details  Name: Steve Brown MRN: [id number] Date of Birth: Jun 04, 1940 Referring Provider (PT): Lawerance Cruel, MD   Encounter Date: 08/30/2020   PT End of Session - 08/30/20 1228     Visit Number 4    Number of Visits 17    Date for PT Re-Evaluation 10/15/20    Authorization Type Medicare A&B; 10th visit PN    Progress Note Due on Visit 10    PT Start Time 11    PT Stop Time 1315    PT Time Calculation (min) 45 min    Equipment Utilized During Treatment Gait belt    Activity Tolerance Patient tolerated treatment well    Behavior During Therapy WFL for tasks assessed/performed             Past Medical History:  Diagnosis Date   Abnormal prostate biopsy    Anticoagulant long-term use    currently xarelto   BPH with elevated PSA    CKD (chronic kidney disease), stage II    Complication of anesthesia    limted neck rom limited use of left arm due to cva   Coronary artery disease    CARDIOLOGIST-  DR Irish Lack--  2010-- PCI w/ stenting midLAD   DDD (degenerative disc disease), lumbar    Degeneration of cervical intervertebral disc    Depression    Diabetes mellitus without complication (HCC)    Dyspnea on exertion    GERD (gastroesophageal reflux disease)    Hemiparesis due to cerebral infarction    History of cerebrovascular accident (CVA) with residual deficit 2002 and 2003--  hemiparisis both sides   per MRI  anterior left frontal lobe, left para midline pons, and inferior cerebullam bilaterally infarcts   History of pulmonary embolus (PE)    06-30-2012  extensive bilaterally   History of recurrent TIAs    History of syncope    hx multiple pre-syncope and syncopal episodes due to vasovagal, orthostatic hypotension, dehydration   History of TIAs    several since  2002   Hyperlipidemia    Hypertension    Mild atherosclerosis of carotid artery, bilateral    per last duplex 11-04-2014  bilateral ICA 1--39%   Neuropathy    fingers   OSA on CPAP    followed by dr dohmeier--  sev. osa w/ AHI 65.9   Prostate cancer ([place]) dx 2018   Renal insufficiency    S/P coronary artery stent placement 2010   stenting to mid LAD   Simple renal cyst    bilaterally   Stroke [facility])    Trigger finger of both hands 11-17-13   Type 2 diabetes mellitus ([place]) dx 1986   last one A1c 9.2 on 04-26-2016   Unsteady gait    . Hx prior CVA/TIAs;   Vertebral artery occlusion, left    chronic    Past Surgical History:  Procedure Laterality Date   ANTERIOR CERVICAL DECOMP/DISCECTOMY FUSION  2004   C3 -- C6 limited rom   CARDIAC CATHETERIZATION  06-10-2010   dr Irish Lack   wide patent LAD stent, mid lesion at the origin of the septal prior to the previous stent 40-50%/  normal LVF, ef 55%   CARDIOVASCULAR STRESS TEST  10-23-2012  dr Irish Lack   normal nuclear perfusion study w/ no ischemia/  normal LV function and wall motion ,  ef 65%   CARPAL TUNNEL RELEASE Bilateral    CATARACT EXTRACTION W/ INTRAOCULAR LENS  IMPLANT, BILATERAL     CHOLECYSTECTOMY N/A 11/02/2015   Procedure: LAPAROSCOPIC CHOLECYSTECTOMY WITH INTRAOPERATIVE CHOLANGIOGRAM;  Surgeon: Donnie Mesa, MD;  Location: [facility];  Service: General;  Laterality: N/A;   COLONOSCOPY     CORONARY ANGIOPLASTY WITH STENT PLACEMENT  02/2008   stenting to mid LAD   GOLD SEED IMPLANT N/A 11/15/2016   Procedure: GOLD SEED IMPLANT [place];  Surgeon: Ardis Hughs, MD;  Location[facility];  Service: Urology;  Laterality: N/A;   IR ANGIO INTRA EXTRACRAN SEL COM CAROTID INNOMINATE BILAT MOD SED  06/13/2018   IR ANGIO VERTEBRAL SEL VERTEBRAL UNI R MOD SED  06/13/2018   IR US GUIDE VASC ACCESS RIGHT  06/13/2018   LEFT HEART CATH AND CORONARY ANGIOGRAPHY N/A 05/25/2017   Procedure: LEFT HEART CATH AND CORONARY  ANGIOGRAPHY;  Surgeon: Jettie Booze, MD;  Location: [facility];  Service: Cardiovascular;  Laterality: N/A;   LEFT HEART CATHETERIZATION WITH CORONARY ANGIOGRAM N/A 04/03/2013   Procedure: LEFT HEART CATHETERIZATION WITH CORONARY ANGIOGRAM;  Surgeon: Jettie Booze, MD;  Location: [facility] CATH LAB;  Service: Cardiovascular;  Laterality: N/A;  patent mLAD stent  w/ mild disease in remainder LAD and its branches;  mod. focal lesion midLCFx- FFR of lesion was negative for ischemia/  normal LVSF, ef 50%   lungs  2005   "fluid pumped off lungs"   NEUROPLASTY / TRANSPOSITION ULNAR NERVE AT ELBOW Right 2004   PROSTATE BIOPSY N/A 08/31/2016   Procedure: PROSTATE  BIOPSY TRANSRECTAL ULTRASONIC PROSTATE (TUBP);  Surgeon: Ardis Hughs, MD;  Location: [facility];  Service: Urology;  Laterality: N/A;   SPACE OAR INSTILLATION N/A 11/15/2016   Procedure: SPACE OAR INSTILLATION;  Surgeon: Ardis Hughs, MD;  Location: [facility];  Service: Urology;  Laterality: N/A;   TRANSTHORACIC ECHOCARDIOGRAM  04/27/2016   severe focal basal LVH, ef 60-65%,  grade 2 diastoilc dysfunction/  mild AR, MR, and TR/  atrial septum lipomatous hypertrophy/  PASP [id number]   UMBILICAL HERNIA REPAIR      There were no vitals filed for this visit.   Subjective Assessment - 08/30/20 1231     Subjective Mild discomfort the day after last session, resolved with pain meds, then no issues from then on.    Pertinent History Long-term anticoagulant use, BPH with elevated PSA, CKD, CAD, DDD lumbar, degeneration of cervical intervertebral disc, depression, DM, DOE, hemiparesis due to CVA, multiple CVA, ACDF C3-C6, bilat carpal tunnel release, PE, h/o syncope, h/o TIA, HLD, HTN, neuropathy, OSA, prostate CA, Vertebral artery occlusion, neuroplasty/transposition ulnar nn at elbow    Limitations Walking;Standing    How long can you sit comfortably? unimited    How long can you stand  comfortably? <15 minutes    How long can you walk comfortably? <15 minutes    Patient Stated Goals To relieve LLE pain, improve walking    Pain Onset More than a month ago                               [facility] Adult PT Treatment/Exercise - 08/30/20 0001       Lumbar Exercises: Seated   Other Seated Lumbar Exercises Core exercises of hip/shoulder tosses, chops and Vs with 2.2# ball, 10 reps each      Knee/Hip Exercises:  Stretches   Active Hamstring Stretch Left;3 reps;30 seconds;Limitations    Active Hamstring Stretch Limitations performed by PT in supine    Other Knee/Hip Stretches L hip flexor stretch, 30sx3      Knee/Hip Exercises: Aerobic   Recumbent Bike SciFit 8" level 2.0 with UE's and LE's      Knee/Hip Exercises: Seated   Long Arc Quad Both;2 sets;15 reps;Limitations    Long Arc Quad Limitations with latissimus press    Marching Both;2 sets;15 reps;Limitations    Marching Limitations with latissimus press                      PT Short Term Goals - 08/30/20 1249       PT SHORT TERM GOAL #1   Title Pt will demonstrate independence with updated and revised HEP    Time 4    Period Weeks    Status New    Target Date 09/16/20      PT SHORT TERM GOAL #2   Title Pt will demonstrate 10 degrees increase in L hamstring ROM and L hip flexor ROM    Baseline 60 deg hamstring, lacking 20 deg to neutral hip extension LLE Thomas test; 08/30/20 L hamstring ROM 90d in supine    Time 4    Period Weeks    Status New    Target Date 09/16/20      PT SHORT TERM GOAL #3   Title Pt will participate in stair negotiation assessment with LTG to be set    Baseline TBD    Time 4    Period Weeks    Status New    Target Date 09/16/20      PT SHORT TERM GOAL #4   Title Pt will decrease five time sit to stand to </= 15 seconds without UE support    Baseline 18.2 seconds    Time 4    Period Weeks    Status New    Target Date 09/16/20      PT SHORT TERM  GOAL #5   Title Pt will increase gait velocity to >/= 2.8 ft/sec without AD and with decreased antalgic gait sequence    Baseline 2.46 ft/sec    Time 4    Period Weeks    Status New    Target Date 09/16/20               PT Long Term Goals - 08/24/20 1514       PT LONG TERM GOAL #1   Title Pt will be independent with final HEP and walking program at [facility]    Time 8    Period Weeks    Status New      PT LONG TERM GOAL #2   Title Pt will negotiate 16 stairs with one rail, alternating sequence MOD I    Time 8    Period Weeks    Status New      PT LONG TERM GOAL #3   Title Pt will demonstrate symmetrical hamstring length and hip extension (Thomas test) L to R side    Time 8    Period Weeks    Status New      PT LONG TERM GOAL #4   Title Pt will decrease five time sit to stand to </=13 seconds to indicate decreased falls risk    Time 8    Period Weeks    Status New      PT LONG TERM GOAL #  5   Title Pt will increase gait velocity to >/= 3.0 ft/sec without AD    Time 8    Period Weeks    Status New                   Plan - 08/30/20 1235     Clinical Impression Statement Pelvic alignment symmetrical at start of todays session.  Todays session focused on aerobic training, L hip flexor and hamstring stertching, Introduction ofseated core strengthening with functional LE movements as well as resisted motion patterns.  Core recriutment reported with exercises, L hip flexor stretch produced L SI discomfort    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     Examination-Activity Limitations Bend;Locomotion Level;Stairs;Stand    Examination-Participation Restrictions Community Activity;Other   Fitness   Stability/Clinical Decision Making Evolving/Moderate complexity    Rehab Potential Good    PT Frequency 2x / week    PT Duration 8 weeks    PT Treatment/Interventions ADLs/Self Care Home Management;Aquatic Therapy;DME Instruction;Gait training;Stair training;Functional mobility training;Therapeutic activities;Therapeutic exercise;Balance training;Neuromuscular re-education;Patient/family education;Orthotic Fit/Training;Cryotherapy;Moist Heat;Manual techniques;Passive range of motion;Dry needling;Taping    PT Next Visit Plan Cont LLE strengthening, hamstring and L hip flexor stretching, lumbar mobility, standing balance.  Manual therapy for lumbar mobility as tolerated.  Assess SI alignment and continue core strengthening    PT Home Exercise Plan [id number]    Consulted and Agree with Plan of Care Patient             Patient will benefit from skilled therapeutic intervention in order to improve the following deficits and impairments:  Decreased activity tolerance, Decreased balance, Cardiopulmonary status limiting activity, Decreased endurance, Decreased range of motion, Decreased strength, Difficulty walking, Impaired sensation, Postural dysfunction, Pain  Visit Diagnosis: Muscle weakness (generalized)  Sciatica, left side  Pain in left leg     Problem List Patient Active Problem List   Diagnosis Date Noted   Syncope 05/26/2020   Back pain [id number]   Acute metabolic encephalopathy [id number]   Lumbar spinal stenosis 08/15/2019   Nonspecific chest pain 08/11/2019   Shortness of breath    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 ([place]) 10/21/2018   Nocturnal hypoxemia 10/21/2018   Radiation therapy complication [id number]   Primary prostate cancer ([place]) 07/31/2017   Anemia 07/31/2017   Vertigo due to cerebrovascular  disease 07/31/2017   Poor compliance with CPAP treatment 07/31/2017   Absolute anemia 05/16/2017   Avitaminosis D 05/16/2017   Benign essential HTN 05/16/2017   Benign prostatic hypertrophy without urinary obstruction 05/16/2017   Clinical depression 05/16/2017   CN (constipation) 05/16/2017   Current drug use 05/16/2017   Diabetic neuropathy ([place]) 05/16/2017   Genital herpes 05/16/2017   Infarction of lung due to iatrogenic pulmonary embolism ([place]) 05/16/2017   Sciatica associated with disorder of lumbar spine 05/16/2017   Arteriosclerosis of coronary artery 05/16/2017   Artery disease, cerebral 05/16/2017   Apnea, sleep 05/16/2017   Arthralgia of hip or thigh 05/16/2017   Malignant neoplasm of prostate ([place]) 09/28/2016   Vascular dementia in remission ([place]) 09/28/2016   Remote history of stroke 09/28/2016   Acute kidney injury ([place]) 05/18/2016   Dehydration 05/18/2016  Near syncope 05/18/2016   Orthostatic hypotension 05/18/2016   CKD (chronic kidney disease), stage II 04/26/2016   UTI (urinary tract infection) 04/26/2016   Encounter for counseling on use of CPAP 11/18/2015   Chronic cholecystitis with calculus 11/02/2015   Stroke, 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[place]) 04/22/2015   TIA (transient ischemic attack) 11/03/2014   OSA on CPAP 05/18/2014   White matter disease 08/14/2013   Abnormal x-ray of temporomandibular joint 05/30/2013   Chronic infection of sinus 05/30/2013   Cough 05/30/2013   Fatigue 05/30/2013   Unsteady gait 05/19/2013   Combined fat and carbohydrate induced hyperlipemia 04/24/2013   Syncope 03/20/2013   Angina pectoris ([place]) 10/08/2012   Hypertension    Stroke ([place])    History of TIAs    Lumbago    Other and unspecified hyperlipidemia    Personal history of unspecified circulatory disease    Unwitnessed fall    Pain in joint, multiple sites    Degeneration of cervical intervertebral disc    Unspecified cardiovascular disease    History of pulmonary  embolism: June 2014,  Takes Xarelto 07/01/2012    Class: History of   Hemiparesis ([place]) 07/18/2011   Diabetes mellitus type 2 with complications ([place]) [id number]   CAD in native artery 07/18/2011   History of recurrent TIAs 07/18/2011    Lanice Shirts PT 08/30/2020, 1:08 PM  [facility] [address] [place], [place], [address] Phone: [phone]   Fax:  [phone]  Name: Steve Brown MRN: [id number] Date of Birth: 01-14-1940

## 2020-09-03 ENCOUNTER — Ambulatory Visit: Payer: Medicare Other

## 2020-09-04 ENCOUNTER — Ambulatory Visit
Admission: EM | Admit: 2020-09-04 | Discharge: 2020-09-04 | Disposition: A | Discharge status: Home / Self Care | Payer: Medicare Other | Attending: Internal Medicine | Admitting: Internal Medicine

## 2020-09-04 ENCOUNTER — Other Ambulatory Visit: Payer: Self-pay

## 2020-09-04 ENCOUNTER — Encounter: Payer: Self-pay | Admitting: General Practice

## 2020-09-04 DIAGNOSIS — Z20822 Contact with and (suspected) exposure to covid-19: Secondary | ICD-10-CM

## 2020-09-04 DIAGNOSIS — J029 Acute pharyngitis, unspecified: Secondary | ICD-10-CM | POA: Diagnosis not present

## 2020-09-04 DIAGNOSIS — J069 Acute upper respiratory infection, unspecified: Secondary | ICD-10-CM | POA: Diagnosis not present

## 2020-09-04 LAB — POCT RAPID STREP A (OFFICE): Rapid Strep A Screen: NEGATIVE

## 2020-09-04 NOTE — ED Triage Notes (Signed)
In a dream someone told him he has covid. However for 3 days has a little sore throat, running nose and fatigue

## 2020-09-04 NOTE — ED Provider Notes (Signed)
[facility]    CSN: [id number] Arrival date & time: 09/04/20  1407      History   Chief Complaint Chief Complaint  Patient presents with   Sore Throat    HPI Steve Brown is a 80 y.o. male.   Patient presents with 3 day history of sore throat, runny nose, and fatigue. Denies any cough, fever, chest pain, shortness of breath, any known sick contacts. Patient has not yet taken any OTC medications to help with symptoms.    Sore Throat   Past Medical History:  Diagnosis Date   Abnormal prostate biopsy    Anticoagulant long-term use    currently xarelto   BPH with elevated PSA    CKD (chronic kidney disease), stage II    Complication of anesthesia    limted neck rom limited use of left arm due to cva   Coronary artery disease    CARDIOLOGIST-  DR Irish Lack--  2010-- PCI w/ stenting midLAD   DDD (degenerative disc disease), lumbar    Degeneration of cervical intervertebral disc    Depression    Diabetes mellitus without complication (HCC)    Dyspnea on exertion    GERD (gastroesophageal reflux disease)    Hemiparesis due to cerebral infarction    History of cerebrovascular accident (CVA) with residual deficit 2002 and 2003--  hemiparisis both sides   per MRI  anterior left frontal lobe, left para midline pons, and inferior cerebullam bilaterally infarcts   History of pulmonary embolus (PE)    06-30-2012  extensive bilaterally   History of recurrent TIAs    History of syncope    hx multiple pre-syncope and syncopal episodes due to vasovagal, orthostatic hypotension, dehydration   History of TIAs    several since 2002   Hyperlipidemia    Hypertension    Mild atherosclerosis of carotid artery, bilateral    per last duplex 11-04-2014  bilateral ICA 1--39%   Neuropathy    fingers   OSA on CPAP    followed by dr dohmeier--  sev. osa w/ AHI 65.9   Prostate cancer ([place]) dx 2018   Renal insufficiency    S/P coronary artery stent placement 2010    stenting to mid LAD   Simple renal cyst    bilaterally   Stroke [facility])    Trigger finger of both hands 11-17-13   Type 2 diabetes mellitus ([place]) dx 1986   last one A1c 9.2 on 04-26-2016   Unsteady gait    . Hx prior CVA/TIAs;   Vertebral artery occlusion, left    chronic    Patient Active Problem List   Diagnosis Date Noted   Syncope 05/26/2020   Back pain [id number]   Acute metabolic encephalopathy [id number]   Lumbar spinal stenosis 08/15/2019   Nonspecific chest pain 08/11/2019   Shortness of breath    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 ([place]) 10/21/2018   Nocturnal hypoxemia 10/21/2018   Radiation therapy complication [id number]   Primary prostate cancer ([place]) 07/31/2017   Anemia 07/31/2017   Vertigo due to cerebrovascular disease 07/31/2017   Poor compliance with CPAP treatment 07/31/2017   Absolute anemia 05/16/2017   Avitaminosis D 05/16/2017   Benign essential HTN 05/16/2017   Benign prostatic hypertrophy without urinary obstruction 05/16/2017   Clinical depression 05/16/2017   CN (constipation) 05/16/2017   Current drug use 05/16/2017   Diabetic neuropathy ([place]) 05/16/2017   Genital herpes 05/16/2017   Infarction of lung due to iatrogenic pulmonary embolism ([place]) 05/16/2017  Sciatica associated with disorder of lumbar spine 05/16/2017   Arteriosclerosis of coronary artery 05/16/2017   Artery disease, cerebral 05/16/2017   Apnea, sleep 05/16/2017   Arthralgia of hip or thigh 05/16/2017   Malignant neoplasm of prostate ([place]) 09/28/2016   Vascular dementia in remission ([place]) 09/28/2016   Remote history of stroke 09/28/2016   Acute kidney injury ([place]) 05/18/2016   Dehydration 05/18/2016   Near syncope 05/18/2016   Orthostatic hypotension 05/18/2016   CKD (chronic kidney disease), stage II 04/26/2016   UTI (urinary tract infection) 04/26/2016   Encounter for counseling on use of CPAP 11/18/2015   Chronic cholecystitis with calculus 11/02/2015    Stroke, 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[place]) 04/22/2015   TIA (transient ischemic attack) 11/03/2014   OSA on CPAP 05/18/2014   White matter disease 08/14/2013   Abnormal x-ray of temporomandibular joint 05/30/2013   Chronic infection of sinus 05/30/2013   Cough 05/30/2013   Fatigue 05/30/2013   Unsteady gait 05/19/2013   Combined fat and carbohydrate induced hyperlipemia 04/24/2013   Syncope 03/20/2013   Angina pectoris ([place]) 10/08/2012   Hypertension    Stroke ([place])    History of TIAs    Lumbago    Other and unspecified hyperlipidemia    Personal history of unspecified circulatory disease    Unwitnessed fall    Pain in joint, multiple sites    Degeneration of cervical intervertebral disc    Unspecified cardiovascular disease    History of pulmonary embolism: June 2014,  Takes Xarelto 07/01/2012    Class: History of   Hemiparesis ([place]) 07/18/2011   Diabetes mellitus type 2 with complications ([place]) 99991111   CAD in native artery 07/18/2011   History of recurrent TIAs 07/18/2011    Past Surgical History:  Procedure Laterality Date   ANTERIOR CERVICAL DECOMP/DISCECTOMY FUSION  2004   C3 -- C6 limited rom   CARDIAC CATHETERIZATION  06-10-2010   dr Irish Lack   wide patent LAD stent, mid lesion at the origin of the septal prior to the previous stent 40-50%/  normal LVF, ef 55%   CARDIOVASCULAR STRESS TEST  10-23-2012  dr Irish Lack   normal nuclear perfusion study w/ no ischemia/  normal LV function and wall motion , ef 65%   CARPAL TUNNEL RELEASE Bilateral    CATARACT EXTRACTION W/ INTRAOCULAR LENS  IMPLANT, BILATERAL     CHOLECYSTECTOMY N/A 11/02/2015   Procedure: LAPAROSCOPIC CHOLECYSTECTOMY WITH INTRAOPERATIVE CHOLANGIOGRAM;  Surgeon: Donnie Mesa, MD;  Location: [facility];  Service: General;  Laterality: N/A;   COLONOSCOPY     CORONARY ANGIOPLASTY WITH STENT PLACEMENT  02/2008   stenting to mid LAD   GOLD SEED IMPLANT N/A 11/15/2016   Procedure: [place];  Surgeon:  Ardis Hughs, MD;  Location[facility];  Service: Urology;  Laterality: N/A;   IR ANGIO INTRA EXTRACRAN SEL COM CAROTID INNOMINATE BILAT MOD SED  06/13/2018   IR ANGIO VERTEBRAL SEL VERTEBRAL UNI R MOD SED  06/13/2018   IR US GUIDE VASC ACCESS RIGHT  06/13/2018   LEFT HEART CATH AND CORONARY ANGIOGRAPHY N/A 05/25/2017   Procedure: LEFT HEART CATH AND CORONARY ANGIOGRAPHY;  Surgeon: Jettie Booze, MD;  Location: [facility];  Service: Cardiovascular;  Laterality: N/A;   LEFT HEART CATHETERIZATION WITH CORONARY ANGIOGRAM N/A 04/03/2013   Procedure: LEFT HEART CATHETERIZATION WITH CORONARY ANGIOGRAM;  Surgeon: Jettie Booze, MD;  Location: [facility];  Service: Cardiovascular;  Laterality: N/A;  patent mLAD stent  w/ mild disease in remainder LAD and its branches;  mod. focal lesion midLCFx- FFR of lesion was negative for ischemia/  normal LVSF, ef 50%   lungs  2005   "fluid pumped off lungs"   NEUROPLASTY / TRANSPOSITION ULNAR NERVE AT ELBOW Right 2004   PROSTATE BIOPSY N/A 08/31/2016   Procedure: PROSTATE  BIOPSY TRANSRECTAL ULTRASONIC PROSTATE (TUBP);  Surgeon: Ardis Hughs, MD;  Location: [facility];  Service: Urology;  Laterality: N/A;   SPACE OAR INSTILLATION N/A 11/15/2016   Procedure: SPACE OAR INSTILLATION;  Surgeon: Ardis Hughs, MD;  Location: [facility];  Service: Urology;  Laterality: N/A;   TRANSTHORACIC ECHOCARDIOGRAM  04/27/2016   severe focal basal LVH, ef 60-65%,  grade 2 diastoilc dysfunction/  mild AR, MR, and TR/  atrial septum lipomatous hypertrophy/  PASP [id number]   UMBILICAL HERNIA REPAIR         Home Medications    Prior to Admission medications   Medication Sig Start Date End Date Taking? Authorizing Provider  acetaminophen (TYLENOL) 500 MG tablet Take 500 mg by mouth every 6 (six) hours as needed (pain).   Yes [provider]  furosemide (LASIX) 20 MG tablet Take 1-2 tablets  (20-40 mg total) by mouth daily as needed for fluid. 06/13/20  Yes Medina-Vargas, Monina C, NP  hydrochlorothiazide (MICROZIDE) 12.5 MG capsule Take 1 capsule (12.5 mg total) by mouth daily. 06/13/20  Yes Medina-Vargas, Monina C, NP  losartan (COZAAR) 100 MG tablet Take 1 tablet (100 mg total) by mouth daily. 06/13/20  Yes Medina-Vargas, Monina C, NP  oxyCODONE (OXY IR/ROXICODONE) 5 MG immediate release tablet Take 1 tablet (5 mg total) by mouth every 6 (six) hours as needed for up to 6 doses for severe pain. 06/08/20  Yes Medina-Vargas, Monina C, NP  XARELTO 20 MG TABS tablet Take 1 tablet (20 mg total) by mouth daily. 06/13/20  Yes Medina-Vargas, Monina C, NP  bisacodyl (DULCOLAX) 10 MG suppository Place 1 suppository (10 mg total) rectally daily as needed for moderate constipation. 05/31/20   Antonieta Pert, MD  diclofenac Sodium (VOLTAREN) 1 % GEL Apply 4 g topically 4 (four) times daily. 09/03/19   Deno Etienne, DO  fluticasone (FLONASE) 50 MCG/ACT nasal spray Place 1 spray into both nostrils daily as needed for allergies. 06/13/20   Medina-Vargas, Monina C, NP  insulin aspart (NOVOLOG) 100 UNIT/ML FlexPen Inject 5 Units into the skin 3 (three) times daily with meals. 06/13/20   Medina-Vargas, Monina C, NP  methocarbamol (ROBAXIN) 500 MG tablet Take 1 tablet (500 mg total) by mouth every 8 (eight) hours as needed for muscle spasms. 06/13/20   Medina-Vargas, Monina C, NP  modafinil (PROVIGIL) 100 MG tablet Take 1 tablet (100 mg total) by mouth daily. 08/23/20   Ward Givens, NP  pantoprazole (PROTONIX) 40 MG tablet Take 1 tablet (40 mg total) by mouth every morning. 06/13/20   Medina-Vargas, Monina C, NP  polyethylene glycol (MIRALAX / GLYCOLAX) 17 g packet Take 17 g by mouth daily. 05/31/20   Antonieta Pert, MD  pregabalin (LYRICA) 300 MG capsule Take 300 mg by mouth 2 (two) times daily. 04/26/20   [provider]  rosuvastatin (CRESTOR) 10 MG tablet Take 1 tablet (10 mg total) by mouth daily. 06/13/20   Medina-Vargas,  Monina C, NP  sitaGLIPtin (JANUVIA) 50 MG tablet Take 1 tablet (50 mg total) by mouth daily. 06/13/20   Medina-Vargas, Monina C, NP  TRESIBA FLEXTOUCH 100 UNIT/ML FlexTouch Pen Inject  34 Units into the skin daily. 06/13/20   Medina-Vargas, Senaida Lange, NP    Family History Family History  Problem Relation Age of Onset   Aneurysm Mother    Cancer Father        unknown either pancreatic or prostate   Stroke Brother    Dementia Sister    Heart attack Neg Hx     Social History Social History   Tobacco Use   Smoking status: Never   Smokeless tobacco: Never  Vaping Use   Vaping Use: Never used  Substance Use Topics   Alcohol use: Not Currently   Drug use: Never     Allergies   Other, Phenergan [promethazine], Iohexol, Iohexol, Phenergan [promethazine], and Tramadol   Review of Systems Review of Systems Per HPI  Physical Exam Triage Vital Signs ED Triage Vitals  Enc Vitals Group     BP 09/04/20 1440 138/79     Pulse Rate 09/04/20 1440 65     Resp 09/04/20 1519 18     Temp 09/04/20 1440 98.1 F (36.7 C)     Temp Source 09/04/20 1440 Oral     SpO2 09/04/20 1440 95 %     Weight --      Height --      Head Circumference --      Peak Flow --      Pain Score 09/04/20 1444 0     Pain Loc --      Pain Edu? --      Excl. in [place]? --    No data found.  Updated Vital Signs BP 138/79 (BP Location: Left Arm)   Pulse 65   Temp 98.1 F (36.7 C) (Oral)   Resp 18   SpO2 95%   Visual Acuity Right Eye Distance:   Left Eye Distance:   Bilateral Distance:    Right Eye Near:   Left Eye Near:    Bilateral Near:     Physical Exam Constitutional:      General: He is not in acute distress.    Appearance: Normal appearance.  HENT:     Head: Normocephalic and atraumatic.     Right Ear: Tympanic membrane and ear canal normal.     Left Ear: Tympanic membrane and ear canal normal.     Nose: Congestion present.     Mouth/Throat:     Mouth: Mucous membranes are moist.      Pharynx: Posterior oropharyngeal erythema present.  Eyes:     Extraocular Movements: Extraocular movements intact.     Conjunctiva/sclera: Conjunctivae normal.     Pupils: Pupils are equal, round, and reactive to light.  Cardiovascular:     Rate and Rhythm: Normal rate and regular rhythm.     Pulses: Normal pulses.     Heart sounds: Normal heart sounds.  Pulmonary:     Effort: Pulmonary effort is normal. No respiratory distress.     Breath sounds: Normal breath sounds. No wheezing.  Abdominal:     General: Abdomen is flat. Bowel sounds are normal.     Palpations: Abdomen is soft.  Musculoskeletal:        General: Normal range of motion.     Cervical back: Normal range of motion.  Skin:    General: Skin is warm and dry.  Neurological:     General: No focal deficit present.     Mental Status: He is alert and oriented to person, place, and time. Mental status is at baseline.  Psychiatric:  Mood and Affect: Mood normal.        Behavior: Behavior normal.     UC Treatments / Results  Labs (all labs ordered are listed, but only abnormal results are displayed) Labs Reviewed  CULTURE, GROUP A STREP ([place])  NOVEL CORONAVIRUS, NAA  POCT RAPID STREP A (OFFICE)    EKG   Radiology No results found.  Procedures Procedures (including critical care time)  Medications Ordered in UC Medications - No data to display  Initial Impression / Assessment and Plan / UC Course  I have reviewed the triage vital signs and the nursing notes.  Pertinent labs & imaging results that were available during my care of the patient were reviewed by me and considered in my medical decision making (see chart for details).     Patient presents with symptoms likely from a viral upper respiratory infection. Differential includes bacterial pneumonia, sinusitis, allergic rhinitis, Covid 19. Do not suspect underlying cardiopulmonary process. Symptoms seem unlikely related to ACS, CHF or COPD  exacerbations, pneumonia, pneumothorax. Patient is nontoxic appearing and not in need of emergent medical intervention.  Recommended symptom control with over the counter medications: Daily oral anti-histamine, Oral decongestant or IN corticosteroid, saline irrigations, cepacol lozenges, honey tea.Discussed treatment with mucinex and coricidin HBP due to safety with patient's health history.   Rapid strep was negative today. Throat culture and covid 19 viral swab are pending.   Return if symptoms fail to improve in 1-2 weeks or you develop shortness of breath, chest pain, severe headache. Patient states understanding and is agreeable.  Discharged with PCP followup.  Final Clinical Impressions(s) / UC Diagnoses   Final diagnoses:  Viral upper respiratory infection  Sore throat  Encounter for laboratory testing for COVID-19 virus     Discharge Instructions      You likely having a viral upper respiratory infection. We recommended symptom control. I expect your symptoms to start improving in the next 1-2 weeks.   1. Take a daily allergy pill/anti-histamine like Zyrtec, Claritin, or Store brand consistently for 2 weeks  2. For congestion you may try an oral decongestant like Mucinex or coricidin HBP. You may also try intranasal flonase nasal spray or saline irrigations (neti pot, sinus cleanse)  3. For your sore throat you may try cepacol lozenges, salt water gargles, throat spray. Treatment of congestion may also help your sore throat.  4. For cough you may try Robitussen  5. Take Tylenol or Ibuprofen to help with pain/inflammation  6. Stay hydrated, drink plenty of fluids to keep throat coated and less irritated  Honey Tea For cough/sore throat try using a honey-based tea. Use 3 teaspoons of honey with juice squeezed from half lemon. Place shaved pieces of ginger into 1/2-1 cup of water and warm over stove top. Then mix the ingredients and repeat every 4 hours as needed.   Your  rapid strep test was negative. Throat culture and covid 19 viral swab are pending. We will call if these are positive.    ED Prescriptions   None    PDMP not reviewed this encounter.   Odis Luster, FNP 09/04/20 1529

## 2020-09-04 NOTE — Discharge Instructions (Addendum)
You likely having a viral upper respiratory infection. We recommended symptom control. I expect your symptoms to start improving in the next 1-2 weeks.   1. Take a daily allergy pill/anti-histamine like Zyrtec, Claritin, or Store brand consistently for 2 weeks  2. For congestion you may try an oral decongestant like Mucinex or coricidin HBP. You may also try intranasal flonase nasal spray or saline irrigations (neti pot, sinus cleanse)  3. For your sore throat you may try cepacol lozenges, salt water gargles, throat spray. Treatment of congestion may also help your sore throat.  4. For cough you may try Robitussen  5. Take Tylenol or Ibuprofen to help with pain/inflammation  6. Stay hydrated, drink plenty of fluids to keep throat coated and less irritated  Honey Tea For cough/sore throat try using a honey-based tea. Use 3 teaspoons of honey with juice squeezed from half lemon. Place shaved pieces of ginger into 1/2-1 cup of water and warm over stove top. Then mix the ingredients and repeat every 4 hours as needed.   Your rapid strep test was negative. Throat culture and covid 19 viral swab are pending. We will call if these are positive.

## 2020-09-05 LAB — NOVEL CORONAVIRUS, NAA: SARS-CoV-2, NAA: DETECTED — AB

## 2020-09-05 LAB — SARS-COV-2, NAA 2 DAY TAT

## 2020-09-06 ENCOUNTER — Ambulatory Visit: Payer: Medicare Other

## 2020-09-06 ENCOUNTER — Ambulatory Visit (INDEPENDENT_AMBULATORY_CARE_PROVIDER_SITE_OTHER): Payer: Medicare Other

## 2020-09-06 ENCOUNTER — Ambulatory Visit
Admission: EM | Admit: 2020-09-06 | Discharge: 2020-09-06 | Disposition: A | Discharge status: Home / Self Care | Payer: Medicare Other | Attending: Physician Assistant | Admitting: Physician Assistant

## 2020-09-06 ENCOUNTER — Encounter: Payer: Self-pay | Admitting: Emergency Medicine

## 2020-09-06 DIAGNOSIS — U071 COVID-19: Secondary | ICD-10-CM | POA: Diagnosis not present

## 2020-09-06 DIAGNOSIS — R0602 Shortness of breath: Secondary | ICD-10-CM

## 2020-09-06 DIAGNOSIS — J1282 Pneumonia due to coronavirus disease 2019: Secondary | ICD-10-CM

## 2020-09-06 DIAGNOSIS — J189 Pneumonia, unspecified organism: Secondary | ICD-10-CM | POA: Diagnosis not present

## 2020-09-06 MED ORDER — AZITHROMYCIN 250 MG PO TABS: 250.0000 mg | ORAL_TABLET | Freq: Once | ORAL | 0 refills | Status: AC

## 2020-09-06 MED ORDER — ALBUTEROL SULFATE HFA 108 (90 BASE) MCG/ACT IN AERS: 1.0000 | INHALATION_SPRAY | Freq: Four times a day (QID) | RESPIRATORY_TRACT | 0 refills | Status: DC | PRN

## 2020-09-06 NOTE — ED Triage Notes (Signed)
Patient was just diagnosed with COVID today, seen on Saturday.  Patient is concerned that he may have pneumonia due to him coughing up clear liquid.  Patient is requesting a CXR.

## 2020-09-06 NOTE — ED Provider Notes (Signed)
[facility]    CSN: [id number] Arrival date & time: 09/06/20  1629      History   Chief Complaint Chief Complaint  Patient presents with   Possible Pneumonia    HPI Steve Brown is a 80 y.o. male.   Pt COVID positive.  Complains of persistent cough with intermittent shortness of breath.  He denies fever, chills, chest pain, night sweats.  He is concerned he may have pneumonia.  He has been taking otc cold and cough medications with temporary improvement.  Pt is to return to rehab and needs a note to return stating he has completed required quarantine.    Past Medical History:  Diagnosis Date   Abnormal prostate biopsy    Anticoagulant long-term use    currently xarelto   BPH with elevated PSA    CKD (chronic kidney disease), stage II    Complication of anesthesia    limted neck rom limited use of left arm due to cva   Coronary artery disease    CARDIOLOGIST-  DR Irish Lack--  2010-- PCI w/ stenting midLAD   DDD (degenerative disc disease), lumbar    Degeneration of cervical intervertebral disc    Depression    Diabetes mellitus without complication (HCC)    Dyspnea on exertion    GERD (gastroesophageal reflux disease)    Hemiparesis due to cerebral infarction    History of cerebrovascular accident (CVA) with residual deficit 2002 and 2003--  hemiparisis both sides   per MRI  anterior left frontal lobe, left para midline pons, and inferior cerebullam bilaterally infarcts   History of pulmonary embolus (PE)    06-30-2012  extensive bilaterally   History of recurrent TIAs    History of syncope    hx multiple pre-syncope and syncopal episodes due to vasovagal, orthostatic hypotension, dehydration   History of TIAs    several since 2002   Hyperlipidemia    Hypertension    Mild atherosclerosis of carotid artery, bilateral    per last duplex 11-04-2014  bilateral ICA 1--39%   Neuropathy    fingers   OSA on CPAP    followed by dr dohmeier--  sev. osa w/  AHI 65.9   Prostate cancer ([place]) dx 2018   Renal insufficiency    S/P coronary artery stent placement 2010   stenting to mid LAD   Simple renal cyst    bilaterally   Stroke [facility])    Trigger finger of both hands 11-17-13   Type 2 diabetes mellitus ([place]) dx 1986   last one A1c 9.2 on 04-26-2016   Unsteady gait    . Hx prior CVA/TIAs;   Vertebral artery occlusion, left    chronic    Patient Active Problem List   Diagnosis Date Noted   Syncope 05/26/2020   Back pain [id number]   Acute metabolic encephalopathy [id number]   Lumbar spinal stenosis 08/15/2019   Nonspecific chest pain 08/11/2019   Shortness of breath    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 ([place]) 10/21/2018   Nocturnal hypoxemia 10/21/2018   Radiation therapy complication [id number]   Primary prostate cancer ([place]) 07/31/2017   Anemia 07/31/2017   Vertigo due to cerebrovascular disease 07/31/2017   Poor compliance with CPAP treatment 07/31/2017   Absolute anemia 05/16/2017   Avitaminosis D 05/16/2017   Benign essential HTN 05/16/2017   Benign prostatic hypertrophy without urinary obstruction 05/16/2017   Clinical depression 05/16/2017   CN (constipation) 05/16/2017   Current drug use 05/16/2017   Diabetic neuropathy (  [place]) 05/16/2017   Genital herpes 05/16/2017   Infarction of lung due to iatrogenic pulmonary embolism ([place]) 05/16/2017   Sciatica associated with disorder of lumbar spine 05/16/2017   Arteriosclerosis of coronary artery 05/16/2017   Artery disease, cerebral 05/16/2017   Apnea, sleep 05/16/2017   Arthralgia of hip or thigh 05/16/2017   Malignant neoplasm of prostate ([place]) 09/28/2016   Vascular dementia in remission ([place]) 09/28/2016   Remote history of stroke 09/28/2016   Acute kidney injury ([place]) 05/18/2016   Dehydration 05/18/2016   Near syncope 05/18/2016   Orthostatic hypotension 05/18/2016   CKD (chronic kidney disease), stage II 04/26/2016   UTI (urinary tract infection)  04/26/2016   Encounter for counseling on use of CPAP 11/18/2015   Chronic cholecystitis with calculus 11/02/2015   Stroke, 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[place]) 04/22/2015   TIA (transient ischemic attack) 11/03/2014   OSA on CPAP 05/18/2014   White matter disease 08/14/2013   Abnormal x-ray of temporomandibular joint 05/30/2013   Chronic infection of sinus 05/30/2013   Cough 05/30/2013   Fatigue 05/30/2013   Unsteady gait 05/19/2013   Combined fat and carbohydrate induced hyperlipemia 04/24/2013   Syncope 03/20/2013   Angina pectoris ([place]) 10/08/2012   Hypertension    Stroke ([place])    History of TIAs    Lumbago    Other and unspecified hyperlipidemia    Personal history of unspecified circulatory disease    Unwitnessed fall    Pain in joint, multiple sites    Degeneration of cervical intervertebral disc    Unspecified cardiovascular disease    History of pulmonary embolism: June 2014,  Takes Xarelto 07/01/2012    Class: History of   Hemiparesis ([place]) 07/18/2011   Diabetes mellitus type 2 with complications ([place]) [id number]   CAD in native artery 07/18/2011   History of recurrent TIAs 07/18/2011    Past Surgical History:  Procedure Laterality Date   ANTERIOR CERVICAL DECOMP/DISCECTOMY FUSION  2004   C3 -- C6 limited rom   CARDIAC CATHETERIZATION  06-10-2010   dr Irish Lack   wide patent LAD stent, mid lesion at the origin of the septal prior to the previous stent 40-50%/  normal LVF, ef 55%   CARDIOVASCULAR STRESS TEST  10-23-2012  dr Irish Lack   normal nuclear perfusion study w/ no ischemia/  normal LV function and wall motion , ef 65%   CARPAL TUNNEL RELEASE Bilateral    CATARACT EXTRACTION W/ INTRAOCULAR LENS  IMPLANT, BILATERAL     CHOLECYSTECTOMY N/A 11/02/2015   Procedure: LAPAROSCOPIC CHOLECYSTECTOMY WITH INTRAOPERATIVE CHOLANGIOGRAM;  Surgeon: Donnie Mesa, MD;  Location: [facility];  Service: General;  Laterality: N/A;   COLONOSCOPY     CORONARY ANGIOPLASTY WITH STENT PLACEMENT   02/2008   stenting to mid LAD   GOLD SEED IMPLANT N/A 11/15/2016   Procedure: [place];  Surgeon: Ardis Hughs, MD;  Location[facility];  Service: Urology;  Laterality: N/A;   IR ANGIO INTRA EXTRACRAN SEL COM CAROTID INNOMINATE BILAT MOD SED  06/13/2018   IR ANGIO VERTEBRAL SEL VERTEBRAL UNI R MOD SED  06/13/2018   IR US GUIDE VASC ACCESS RIGHT  06/13/2018   LEFT HEART CATH AND CORONARY ANGIOGRAPHY N/A 05/25/2017   Procedure: LEFT HEART CATH AND CORONARY ANGIOGRAPHY;  Surgeon: Jettie Booze, MD;  Location: [facility];  Service: Cardiovascular;  Laterality: N/A;   LEFT HEART CATHETERIZATION WITH CORONARY ANGIOGRAM N/A 04/03/2013   Procedure: LEFT HEART CATHETERIZATION WITH CORONARY ANGIOGRAM;  Surgeon: Jettie Booze, MD;  Location: [facility] CATH LAB;  Service: Cardiovascular;  Laterality: N/A;  patent mLAD stent  w/ mild disease in remainder LAD and its branches;  mod. focal lesion midLCFx- FFR of lesion was negative for ischemia/  normal LVSF, ef 50%   lungs  2005   "fluid pumped off lungs"   NEUROPLASTY / TRANSPOSITION ULNAR NERVE AT ELBOW Right 2004   PROSTATE BIOPSY N/A 08/31/2016   Procedure: PROSTATE  BIOPSY TRANSRECTAL ULTRASONIC PROSTATE (TUBP);  Surgeon: Ardis Hughs, MD;  Location[facility];  Service: Urology;  Laterality: N/A;   SPACE OAR INSTILLATION N/A 11/15/2016   Procedure: SPACE OAR INSTILLATION;  Surgeon: Ardis Hughs, MD;  Location: [facility];  Service: Urology;  Laterality: N/A;   TRANSTHORACIC ECHOCARDIOGRAM  04/27/2016   severe focal basal LVH, ef 60-65%,  grade 2 diastoilc dysfunction/  mild AR, MR, and TR/  atrial septum lipomatous hypertrophy/  PASP [id number]   UMBILICAL HERNIA REPAIR         Home Medications    Prior to Admission medications   Medication Sig Start Date End Date Taking? Authorizing Provider  acetaminophen (TYLENOL) 500 MG tablet Take 500 mg by mouth  every 6 (six) hours as needed (pain).   Yes [provider]  bisacodyl (DULCOLAX) 10 MG suppository Place 1 suppository (10 mg total) rectally daily as needed for moderate constipation. 05/31/20  Yes Antonieta Pert, MD  diclofenac Sodium (VOLTAREN) 1 % GEL Apply 4 g topically 4 (four) times daily. 09/03/19  Yes Deno Etienne, DO  fluticasone (FLONASE) 50 MCG/ACT nasal spray Place 1 spray into both nostrils daily as needed for allergies. 06/13/20  Yes Medina-Vargas, Monina C, NP  furosemide (LASIX) 20 MG tablet Take 1-2 tablets (20-40 mg total) by mouth daily as needed for fluid. 06/13/20  Yes Medina-Vargas, Monina C, NP  hydrochlorothiazide (MICROZIDE) 12.5 MG capsule Take 1 capsule (12.5 mg total) by mouth daily. 06/13/20  Yes Medina-Vargas, Monina C, NP  insulin aspart (NOVOLOG) 100 UNIT/ML FlexPen Inject 5 Units into the skin 3 (three) times daily with meals. 06/13/20  Yes Medina-Vargas, Monina C, NP  losartan (COZAAR) 100 MG tablet Take 1 tablet (100 mg total) by mouth daily. 06/13/20  Yes Medina-Vargas, Monina C, NP  methocarbamol (ROBAXIN) 500 MG tablet Take 1 tablet (500 mg total) by mouth every 8 (eight) hours as needed for muscle spasms. 06/13/20  Yes Medina-Vargas, Monina C, NP  modafinil (PROVIGIL) 100 MG tablet Take 1 tablet (100 mg total) by mouth daily. 08/23/20  Yes Ward Givens, NP  oxyCODONE (OXY IR/ROXICODONE) 5 MG immediate release tablet Take 1 tablet (5 mg total) by mouth every 6 (six) hours as needed for up to 6 doses for severe pain. 06/08/20  Yes Medina-Vargas, Monina C, NP  pantoprazole (PROTONIX) 40 MG tablet Take 1 tablet (40 mg total) by mouth every morning. 06/13/20  Yes Medina-Vargas, Monina C, NP  polyethylene glycol (MIRALAX / GLYCOLAX) 17 g packet Take 17 g by mouth daily. 05/31/20  Yes Antonieta Pert, MD  pregabalin (LYRICA) 300 MG capsule Take 300 mg by mouth 2 (two) times daily. 04/26/20  Yes [provider]  rosuvastatin (CRESTOR) 10 MG tablet Take 1 tablet (10 mg total) by  mouth daily. 06/13/20  Yes Medina-Vargas, Monina C, NP  sitaGLIPtin (JANUVIA) 50 MG tablet Take 1 tablet (50 mg total) by mouth daily. 06/13/20  Yes Medina-Vargas, Monina C, NP  TRESIBA FLEXTOUCH 100 UNIT/ML FlexTouch Pen Inject 34  Units into the skin daily. 06/13/20  Yes Medina-Vargas, Monina C, NP  XARELTO 20 MG TABS tablet Take 1 tablet (20 mg total) by mouth daily. 06/13/20  Yes Medina-Vargas, Monina C, NP    Family History Family History  Problem Relation Age of Onset   Aneurysm Mother    Cancer Father        unknown either pancreatic or prostate   Stroke Brother    Dementia Sister    Heart attack Neg Hx     Social History Social History   Tobacco Use   Smoking status: Never   Smokeless tobacco: Never  Vaping Use   Vaping Use: Never used  Substance Use Topics   Alcohol use: Not Currently   Drug use: Never     Allergies   Other, Phenergan [promethazine], Iohexol, Iohexol, Phenergan [promethazine], and Tramadol   Review of Systems Review of Systems  Constitutional:  Negative for chills and fever.  HENT:  Negative for ear pain and sore throat.   Eyes:  Negative for pain and visual disturbance.  Respiratory:  Positive for cough and shortness of breath.   Cardiovascular:  Negative for chest pain and palpitations.  Gastrointestinal:  Negative for abdominal pain and vomiting.  Genitourinary:  Negative for dysuria and hematuria.  Musculoskeletal:  Negative for arthralgias and back pain.  Skin:  Negative for color change and rash.  Neurological:  Negative for seizures and syncope.  All other systems reviewed and are negative.   Physical Exam Triage Vital Signs ED Triage Vitals  Enc Vitals Group     BP 09/06/20 1712 122/67     Pulse Rate 09/06/20 1712 66     Resp 09/06/20 1712 18     Temp 09/06/20 1712 98.3 F (36.8 C)     Temp Source 09/06/20 1712 Oral     SpO2 09/06/20 1712 95 %     Weight --      Height --      Head Circumference --      Peak Flow --      Pain  Score 09/06/20 1714 0     Pain Loc --      Pain Edu? --      Excl. in [place]? --    No data found.  Updated Vital Signs BP 122/67 (BP Location: Left Arm)   Pulse 66   Temp 98.3 F (36.8 C) (Oral)   Resp 18   SpO2 95%   Visual Acuity Right Eye Distance:   Left Eye Distance:   Bilateral Distance:    Right Eye Near:   Left Eye Near:    Bilateral Near:     Physical Exam Vitals and nursing note reviewed.  Constitutional:      Appearance: He is well-developed.  HENT:     Head: Normocephalic and atraumatic.  Eyes:     Conjunctiva/sclera: Conjunctivae normal.  Cardiovascular:     Rate and Rhythm: Normal rate and regular rhythm.     Heart sounds: No murmur heard. Pulmonary:     Effort: Pulmonary effort is normal. No respiratory distress.     Breath sounds: Normal breath sounds.  Abdominal:     Palpations: Abdomen is soft.     Tenderness: There is no abdominal tenderness.  Musculoskeletal:     Cervical back: Neck supple.  Skin:    General: Skin is warm and dry.  Neurological:     Mental Status: He is alert.     UC Treatments / Results  Labs (all labs ordered are listed, but only abnormal results are displayed) Labs Reviewed - No data to display  EKG   Radiology No results found.  Procedures Procedures (including critical care time)  Medications Ordered in UC Medications - No data to display  Initial Impression / Assessment and Plan / UC Course  I have reviewed the triage vital signs and the nursing notes.  Pertinent labs & imaging results that were available during my care of the patient were reviewed by me and considered in my medical decision making (see chart for details).    COVID. Chest xrays with signs of COVID pneumonia. Pt well appearing, vitals WNL.  Albuterol inhaler prescribed, antibiotic prescribed. Advised continue supportive care.  Return precautions discussed.  Final Clinical Impressions(s) / UC Diagnoses   Final diagnoses:  None    Discharge Instructions   None    ED Prescriptions   None    PDMP not reviewed this encounter.   Ward, Lenise Arena, PA-C 09/06/20 [phone]

## 2020-09-06 NOTE — Discharge Instructions (Addendum)
Take medication as prescribed Can use inhaler as needed for shortness of breath/chest tightness.  If symptoms become worse go to the Emergency Department for evaluation

## 2020-09-07 LAB — CULTURE, GROUP A STREP (THRC)

## 2020-09-10 ENCOUNTER — Ambulatory Visit: Payer: Medicare Other

## 2020-09-14 ENCOUNTER — Ambulatory Visit: Payer: Medicare Other | Attending: Family Medicine

## 2020-09-14 ENCOUNTER — Other Ambulatory Visit: Payer: Self-pay

## 2020-09-14 DIAGNOSIS — M5432 Sciatica, left side: Secondary | ICD-10-CM | POA: Insufficient documentation

## 2020-09-14 DIAGNOSIS — M6281 Muscle weakness (generalized): Secondary | ICD-10-CM | POA: Diagnosis not present

## 2020-09-14 DIAGNOSIS — R2681 Unsteadiness on feet: Secondary | ICD-10-CM | POA: Diagnosis not present

## 2020-09-14 DIAGNOSIS — R2689 Other abnormalities of gait and mobility: Secondary | ICD-10-CM | POA: Diagnosis not present

## 2020-09-14 NOTE — Therapy (Signed)
[facility] [address] [place], [place], [address] Phone: [phone]   Fax:  [phone]  Physical Therapy Treatment  Patient Details  Name: Steve Brown MRN: [id number] Date of Birth: 26-Apr-1940 Referring Provider (PT): Lawerance Cruel, MD   Encounter Date: 09/14/2020   PT End of Session - 09/14/20 1236     Visit Number 5    Number of Visits 17    Date for PT Re-Evaluation 10/15/20    Authorization Type Medicare A&B; 10th visit PN    Progress Note Due on Visit 10    PT Start Time 30    PT Stop Time 1315    PT Time Calculation (min) 45 min    Equipment Utilized During Treatment Gait belt    Activity Tolerance Patient tolerated treatment well    Behavior During Therapy WFL for tasks assessed/performed             Past Medical History:  Diagnosis Date   Abnormal prostate biopsy    Anticoagulant long-term use    currently xarelto   BPH with elevated PSA    CKD (chronic kidney disease), stage II    Complication of anesthesia    limted neck rom limited use of left arm due to cva   Coronary artery disease    CARDIOLOGIST-  DR Irish Lack--  2010-- PCI w/ stenting midLAD   DDD (degenerative disc disease), lumbar    Degeneration of cervical intervertebral disc    Depression    Diabetes mellitus without complication (HCC)    Dyspnea on exertion    GERD (gastroesophageal reflux disease)    Hemiparesis due to cerebral infarction    History of cerebrovascular accident (CVA) with residual deficit 2002 and 2003--  hemiparisis both sides   per MRI  anterior left frontal lobe, left para midline pons, and inferior cerebullam bilaterally infarcts   History of pulmonary embolus (PE)    06-30-2012  extensive bilaterally   History of recurrent TIAs    History of syncope    hx multiple pre-syncope and syncopal episodes due to vasovagal, orthostatic hypotension, dehydration   History of TIAs    several since 2002    Hyperlipidemia    Hypertension    Mild atherosclerosis of carotid artery, bilateral    per last duplex 11-04-2014  bilateral ICA 1--39%   Neuropathy    fingers   OSA on CPAP    followed by dr dohmeier--  sev. osa w/ AHI 65.9   Prostate cancer ([place]) dx 2018   Renal insufficiency    S/P coronary artery stent placement 2010   stenting to mid LAD   Simple renal cyst    bilaterally   Stroke [facility])    Trigger finger of both hands 11-17-13   Type 2 diabetes mellitus ([place]) dx 1986   last one A1c 9.2 on 04-26-2016   Unsteady gait    . Hx prior CVA/TIAs;   Vertebral artery occlusion, left    chronic    Past Surgical History:  Procedure Laterality Date   ANTERIOR CERVICAL DECOMP/DISCECTOMY FUSION  2004   C3 -- C6 limited rom   CARDIAC CATHETERIZATION  06-10-2010   dr Irish Lack   wide patent LAD stent, mid lesion at the origin of the septal prior to the previous stent 40-50%/  normal LVF, ef 55%   CARDIOVASCULAR STRESS TEST  10-23-2012  dr Irish Lack   normal nuclear perfusion study w/ no ischemia/  normal LV function and wall motion ,  ef 65%   CARPAL TUNNEL RELEASE Bilateral    CATARACT EXTRACTION W/ INTRAOCULAR LENS  IMPLANT, BILATERAL     CHOLECYSTECTOMY N/A 11/02/2015   Procedure: LAPAROSCOPIC CHOLECYSTECTOMY WITH INTRAOPERATIVE CHOLANGIOGRAM;  Surgeon: Donnie Mesa, MD;  Location: [facility];  Service: General;  Laterality: N/A;   COLONOSCOPY     CORONARY ANGIOPLASTY WITH STENT PLACEMENT  02/2008   stenting to mid LAD   GOLD SEED IMPLANT N/A 11/15/2016   Procedure: GOLD SEED IMPLANT [place];  Surgeon: Ardis Hughs, MD;  Location: [facility];  Service: Urology;  Laterality: N/A;   IR ANGIO INTRA EXTRACRAN SEL COM CAROTID INNOMINATE BILAT MOD SED  06/13/2018   IR ANGIO VERTEBRAL SEL VERTEBRAL UNI R MOD SED  06/13/2018   IR US GUIDE VASC ACCESS RIGHT  06/13/2018   LEFT HEART CATH AND CORONARY ANGIOGRAPHY N/A 05/25/2017   Procedure: LEFT HEART CATH AND CORONARY  ANGIOGRAPHY;  Surgeon: Jettie Booze, MD;  Location: [facility];  Service: Cardiovascular;  Laterality: N/A;   LEFT HEART CATHETERIZATION WITH CORONARY ANGIOGRAM N/A 04/03/2013   Procedure: LEFT HEART CATHETERIZATION WITH CORONARY ANGIOGRAM;  Surgeon: Jettie Booze, MD;  Location: [facility] CATH LAB;  Service: Cardiovascular;  Laterality: N/A;  patent mLAD stent  w/ mild disease in remainder LAD and its branches;  mod. focal lesion midLCFx- FFR of lesion was negative for ischemia/  normal LVSF, ef 50%   lungs  2005   "fluid pumped off lungs"   NEUROPLASTY / TRANSPOSITION ULNAR NERVE AT ELBOW Right 2004   PROSTATE BIOPSY N/A 08/31/2016   Procedure: PROSTATE  BIOPSY TRANSRECTAL ULTRASONIC PROSTATE (TUBP);  Surgeon: Ardis Hughs, MD;  Location: [facility];  Service: Urology;  Laterality: N/A;   SPACE OAR INSTILLATION N/A 11/15/2016   Procedure: SPACE OAR INSTILLATION;  Surgeon: Ardis Hughs, MD;  Location: [facility];  Service: Urology;  Laterality: N/A;   TRANSTHORACIC ECHOCARDIOGRAM  04/27/2016   severe focal basal LVH, ef 60-65%,  grade 2 diastoilc dysfunction/  mild AR, MR, and TR/  atrial septum lipomatous hypertrophy/  PASP [id number]   UMBILICAL HERNIA REPAIR      There were no vitals filed for this visit.   Subjective Assessment - 09/14/20 1234     Subjective LLE still problematic and feels gap in PT has elevated smptoms slightly    Pertinent History Long-term anticoagulant use, BPH with elevated PSA, CKD, CAD, DDD lumbar, degeneration of cervical intervertebral disc, depression, DM, DOE, hemiparesis due to CVA, multiple CVA, ACDF C3-C6, bilat carpal tunnel release, PE, h/o syncope, h/o TIA, HLD, HTN, neuropathy, OSA, prostate CA, Vertebral artery occlusion, neuroplasty/transposition ulnar nn at elbow    Limitations Walking;Standing    How long can you sit comfortably? unimited    How long can you stand comfortably? <15 minutes    How  long can you walk comfortably? <15 minutes    Patient Stated Goals To relieve LLE pain, improve walking    Pain Onset More than a month ago                               [facility] Adult PT Treatment/Exercise - 09/14/20 0001       Lumbar Exercises: Stretches   Piriformis Stretch Left;3 reps;30 seconds    Piriformis Stretch Limitations patient performed      Lumbar Exercises: Seated   Other Seated Lumbar Exercises Core exercises of  hip/shoulder tosses, chops and Vs with 3.3# ball, 10 reps each      Knee/Hip Exercises: Stretches   Other Knee/Hip Stretches L hip flexor stretch, 30sx3 with tactile assist from PT      Knee/Hip Exercises: Aerobic   Recumbent Bike SciFit 8" level 2.5 with UE's and LE's, seat 18      Knee/Hip Exercises: Standing   Functional Squat 1 set;10 reps;Limitations    Functional Squat Limitations encouraged foot flat      Manual Therapy   Manual Therapy Soft tissue mobilization    Manual therapy comments STM to L piriformis in R sidelie, 8'                      PT Short Term Goals - 08/30/20 1249       PT SHORT TERM GOAL #1   Title Pt will demonstrate independence with updated and revised HEP    Time 4    Period Weeks    Status New    Target Date 09/16/20      PT SHORT TERM GOAL #2   Title Pt will demonstrate 10 degrees increase in L hamstring ROM and L hip flexor ROM    Baseline 60 deg hamstring, lacking 20 deg to neutral hip extension LLE Thomas test; 08/30/20 L hamstring ROM 90d in supine    Time 4    Period Weeks    Status New    Target Date 09/16/20      PT SHORT TERM GOAL #3   Title Pt will participate in stair negotiation assessment with LTG to be set    Baseline TBD    Time 4    Period Weeks    Status New    Target Date 09/16/20      PT SHORT TERM GOAL #4   Title Pt will decrease five time sit to stand to </= 15 seconds without UE support    Baseline 18.2 seconds    Time 4    Period Weeks    Status New     Target Date 09/16/20      PT SHORT TERM GOAL #5   Title Pt will increase gait velocity to >/= 2.8 ft/sec without AD and with decreased antalgic gait sequence    Baseline 2.46 ft/sec    Time 4    Period Weeks    Status New    Target Date 09/16/20               PT Long Term Goals - 08/24/20 1514       PT LONG TERM GOAL #1   Title Pt will be independent with final HEP and walking program at [facility]    Time 8    Period Weeks    Status New      PT LONG TERM GOAL #2   Title Pt will negotiate 16 stairs with one rail, alternating sequence MOD I    Time 8    Period Weeks    Status New      PT LONG TERM GOAL #3   Title Pt will demonstrate symmetrical hamstring length and hip extension (Thomas test) L to R side    Time 8    Period Weeks    Status New      PT LONG TERM GOAL #4   Title Pt will decrease five time sit to stand to </=13 seconds to indicate decreased falls risk    Time 8    Period  Weeks    Status New      PT LONG TERM GOAL #5   Title Pt will increase gait velocity to >/= 3.0 ft/sec without AD    Time 8    Period Weeks    Status New                   Plan - 09/14/20 1241     Clinical Impression Statement Pelvic alignment remains normal, no need to correct.  Continued to focus on stretching and core strengthening.  Palpable trigger point to L piriformis identified and STM applied with symptom relief noted.  Continued soft tissue restrictions in L hip flexor tissue group.  Increased resistance on seated core tasks. Continued signs of hip flexor and piriformis tightness on L.  Improved lumbar extension noted during squats    Personal Factors and Comorbidities Comorbidity 3+;Past/Current Experience    Comorbidities Long-term anticoagulant use, BPH with elevated PSA, CKD, CAD, DDD lumbar, degeneration of cervical intervertebral disc, depression, DM, DOE, hemiparesis due to CVA, multiple CVA, ACDF C3-C6, bilat carpal tunnel release, PE, h/o syncope, h/o TIA,  HLD, HTN, neuropathy, OSA, prostate CA, Vertebral artery occlusion, neuroplasty/transposition ulnar nn at elbow    Examination-Activity Limitations Bend;Locomotion Level;Stairs;Stand    Examination-Participation Restrictions Community Activity;Other   Fitness   Stability/Clinical Decision Making Evolving/Moderate complexity    Rehab Potential Good    PT Frequency 2x / week    PT Duration 8 weeks    PT Treatment/Interventions ADLs/Self Care Home Management;Aquatic Therapy;DME Instruction;Gait training;Stair training;Functional mobility training;Therapeutic activities;Therapeutic exercise;Balance training;Neuromuscular re-education;Patient/family education;Orthotic Fit/Training;Cryotherapy;Moist Heat;Manual techniques;Passive range of motion;Dry needling;Taping    PT Next Visit Plan Monitor pelvic alignment, manual STM to L priformis, L hip flexor stretching, STG progress    PT Home Exercise Plan [email or web address]    Consulted and Agree with Plan of Care Patient             Patient will benefit from skilled therapeutic intervention in order to improve the following deficits and impairments:  Decreased activity tolerance, Decreased balance, Cardiopulmonary status limiting activity, Decreased endurance, Decreased range of motion, Decreased strength, Difficulty walking, Impaired sensation, Postural dysfunction, Pain  Visit Diagnosis: Muscle weakness (generalized)  Sciatica, left side  Unsteadiness on feet  Other abnormalities of gait and mobility     Problem List Patient Active Problem List   Diagnosis Date Noted   Syncope 05/26/2020   Back pain [id number]   Acute metabolic encephalopathy [id number]   Lumbar spinal stenosis 08/15/2019   Nonspecific chest pain 08/11/2019   Shortness of breath    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 (HCC) 10/21/2018   Nocturnal hypoxemia 10/21/2018   Radiation therapy complication [id number]   Primary prostate cancer ([place]) 07/31/2017    Anemia 07/31/2017   Vertigo due to cerebrovascular disease 07/31/2017   Poor compliance with CPAP treatment 07/31/2017   Absolute anemia 05/16/2017   Avitaminosis D 05/16/2017   Benign essential HTN 05/16/2017   Benign prostatic hypertrophy without urinary obstruction 05/16/2017   Clinical depression 05/16/2017   CN (constipation) 05/16/2017   Current drug use 05/16/2017   Diabetic neuropathy ([place]) 05/16/2017   Genital herpes 05/16/2017   Infarction of lung due to iatrogenic pulmonary embolism ([place]) 05/16/2017   Sciatica associated with disorder of lumbar spine 05/16/2017   Arteriosclerosis of coronary artery 05/16/2017   Artery disease, cerebral 05/16/2017   Apnea, sleep 05/16/2017   Arthralgia of hip or thigh 05/16/2017   Malignant neoplasm of prostate ([place]) 09/28/2016   Vascular dementia  in remission ([place]) 09/28/2016   Remote history of stroke 09/28/2016   Acute kidney injury ([place]) 05/18/2016   Dehydration 05/18/2016   Near syncope 05/18/2016   Orthostatic hypotension 05/18/2016   CKD (chronic kidney disease), stage II 04/26/2016   UTI (urinary tract infection) 04/26/2016   Encounter for counseling on use of CPAP 11/18/2015   Chronic cholecystitis with calculus 11/02/2015   Stroke, 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[place]) 04/22/2015   TIA (transient ischemic attack) 11/03/2014   OSA on CPAP 05/18/2014   White matter disease 08/14/2013   Abnormal x-ray of temporomandibular joint 05/30/2013   Chronic infection of sinus 05/30/2013   Cough 05/30/2013   Fatigue 05/30/2013   Unsteady gait 05/19/2013   Combined fat and carbohydrate induced hyperlipemia 04/24/2013   Syncope 03/20/2013   Angina pectoris ([place]) 10/08/2012   Hypertension    Stroke ([place])    History of TIAs    Lumbago    Other and unspecified hyperlipidemia    Personal history of unspecified circulatory disease    Unwitnessed fall    Pain in joint, multiple sites    Degeneration of cervical intervertebral disc    Unspecified  cardiovascular disease    History of pulmonary embolism: June 2014,  Takes Xarelto 07/01/2012    Class: History of   Hemiparesis ([place]) 07/18/2011   Diabetes mellitus type 2 with complications ([place]) [id number]   CAD in native artery 07/18/2011   History of recurrent TIAs 07/18/2011    Lanice Shirts PT 09/14/2020, 2:23 PM  [facility] [address] [place], [place], [address] Phone: [phone]   Fax:  [phone]  Name: Steve Brown MRN: [id number] Date of Birth: 1940/02/16

## 2020-09-17 ENCOUNTER — Other Ambulatory Visit: Payer: Self-pay

## 2020-09-17 ENCOUNTER — Ambulatory Visit: Payer: Medicare Other

## 2020-09-17 DIAGNOSIS — M5432 Sciatica, left side: Secondary | ICD-10-CM

## 2020-09-17 DIAGNOSIS — R2681 Unsteadiness on feet: Secondary | ICD-10-CM

## 2020-09-17 DIAGNOSIS — M6281 Muscle weakness (generalized): Secondary | ICD-10-CM

## 2020-09-17 DIAGNOSIS — R2689 Other abnormalities of gait and mobility: Secondary | ICD-10-CM | POA: Diagnosis not present

## 2020-09-17 NOTE — Therapy (Signed)
[facility] [address] [place], [place], [address] Phone: [phone]   Fax:  [phone]  Physical Therapy Treatment  Patient Details  Name: Steve Brown MRN: [id number] Date of Birth: 1940-01-12 Referring Provider (PT): Lawerance Cruel, MD   Encounter Date: 09/17/2020   PT End of Session - 09/17/20 1320     Visit Number 6    Number of Visits 17    Date for PT Re-Evaluation 10/15/20    Authorization Type Medicare A&B; 10th visit PN    Progress Note Due on Visit 10    PT Start Time 24    PT Stop Time 1315    PT Time Calculation (min) 45 min    Equipment Utilized During Treatment Gait belt    Activity Tolerance Patient tolerated treatment well    Behavior During Therapy WFL for tasks assessed/performed             Past Medical History:  Diagnosis Date   Abnormal prostate biopsy    Anticoagulant long-term use    currently xarelto   BPH with elevated PSA    CKD (chronic kidney disease), stage II    Complication of anesthesia    limted neck rom limited use of left arm due to cva   Coronary artery disease    CARDIOLOGIST-  DR Irish Lack--  2010-- PCI w/ stenting midLAD   DDD (degenerative disc disease), lumbar    Degeneration of cervical intervertebral disc    Depression    Diabetes mellitus without complication (HCC)    Dyspnea on exertion    GERD (gastroesophageal reflux disease)    Hemiparesis due to cerebral infarction    History of cerebrovascular accident (CVA) with residual deficit 2002 and 2003--  hemiparisis both sides   per MRI  anterior left frontal lobe, left para midline pons, and inferior cerebullam bilaterally infarcts   History of pulmonary embolus (PE)    06-30-2012  extensive bilaterally   History of recurrent TIAs    History of syncope    hx multiple pre-syncope and syncopal episodes due to vasovagal, orthostatic hypotension, dehydration   History of TIAs    several since 2002    Hyperlipidemia    Hypertension    Mild atherosclerosis of carotid artery, bilateral    per last duplex 11-04-2014  bilateral ICA 1--39%   Neuropathy    fingers   OSA on CPAP    followed by dr dohmeier--  sev. osa w/ AHI 65.9   Prostate cancer ([place]) dx 2018   Renal insufficiency    S/P coronary artery stent placement 2010   stenting to mid LAD   Simple renal cyst    bilaterally   Stroke [facility])    Trigger finger of both hands 11-17-13   Type 2 diabetes mellitus ([place]) dx 1986   last one A1c 9.2 on 04-26-2016   Unsteady gait    . Hx prior CVA/TIAs;   Vertebral artery occlusion, left    chronic    Past Surgical History:  Procedure Laterality Date   ANTERIOR CERVICAL DECOMP/DISCECTOMY FUSION  2004   C3 -- C6 limited rom   CARDIAC CATHETERIZATION  06-10-2010   dr Irish Lack   wide patent LAD stent, mid lesion at the origin of the septal prior to the previous stent 40-50%/  normal LVF, ef 55%   CARDIOVASCULAR STRESS TEST  10-23-2012  dr Irish Lack   normal nuclear perfusion study w/ no ischemia/  normal LV function and wall motion ,  ef 65%   CARPAL TUNNEL RELEASE Bilateral    CATARACT EXTRACTION W/ INTRAOCULAR LENS  IMPLANT, BILATERAL     CHOLECYSTECTOMY N/A 11/02/2015   Procedure: LAPAROSCOPIC CHOLECYSTECTOMY WITH INTRAOPERATIVE CHOLANGIOGRAM;  Surgeon: Donnie Mesa, MD;  Location: [facility];  Service: General;  Laterality: N/A;   COLONOSCOPY     CORONARY ANGIOPLASTY WITH STENT PLACEMENT  02/2008   stenting to mid LAD   GOLD SEED IMPLANT N/A 11/15/2016   Procedure: GOLD SEED IMPLANT [place];  Surgeon: Ardis Hughs, MD;  Location[facility];  Service: Urology;  Laterality: N/A;   IR ANGIO INTRA EXTRACRAN SEL COM CAROTID INNOMINATE BILAT MOD SED  06/13/2018   IR ANGIO VERTEBRAL SEL VERTEBRAL UNI R MOD SED  06/13/2018   IR US GUIDE VASC ACCESS RIGHT  06/13/2018   LEFT HEART CATH AND CORONARY ANGIOGRAPHY N/A 05/25/2017   Procedure: LEFT HEART CATH AND CORONARY  ANGIOGRAPHY;  Surgeon: Jettie Booze, MD;  Location: [facility];  Service: Cardiovascular;  Laterality: N/A;   LEFT HEART CATHETERIZATION WITH CORONARY ANGIOGRAM N/A 04/03/2013   Procedure: LEFT HEART CATHETERIZATION WITH CORONARY ANGIOGRAM;  Surgeon: Jettie Booze, MD;  Location: [facility] CATH LAB;  Service: Cardiovascular;  Laterality: N/A;  patent mLAD stent  w/ mild disease in remainder LAD and its branches;  mod. focal lesion midLCFx- FFR of lesion was negative for ischemia/  normal LVSF, ef 50%   lungs  2005   "fluid pumped off lungs"   NEUROPLASTY / TRANSPOSITION ULNAR NERVE AT ELBOW Right 2004   PROSTATE BIOPSY N/A 08/31/2016   Procedure: PROSTATE  BIOPSY TRANSRECTAL ULTRASONIC PROSTATE (TUBP);  Surgeon: Ardis Hughs, MD;  Location: [facility];  Service: Urology;  Laterality: N/A;   SPACE OAR INSTILLATION N/A 11/15/2016   Procedure: SPACE OAR INSTILLATION;  Surgeon: Ardis Hughs, MD;  Location: [facility];  Service: Urology;  Laterality: N/A;   TRANSTHORACIC ECHOCARDIOGRAM  04/27/2016   severe focal basal LVH, ef 60-65%,  grade 2 diastoilc dysfunction/  mild AR, MR, and TR/  atrial septum lipomatous hypertrophy/  PASP [id number]   UMBILICAL HERNIA REPAIR      There were no vitals filed for this visit.   Subjective Assessment - 09/17/20 1239     Subjective pain and symptoms seem localized to L thigh, does not want to undergo surgrey as offered by pain management    Pertinent History Long-term anticoagulant use, BPH with elevated PSA, CKD, CAD, DDD lumbar, degeneration of cervical intervertebral disc, depression, DM, DOE, hemiparesis due to CVA, multiple CVA, ACDF C3-C6, bilat carpal tunnel release, PE, h/o syncope, h/o TIA, HLD, HTN, neuropathy, OSA, prostate CA, Vertebral artery occlusion, neuroplasty/transposition ulnar nn at elbow    Limitations Walking;Standing    How long can you sit comfortably? unimited    How long can you  stand comfortably? <15 minutes    How long can you walk comfortably? <15 minutes    Patient Stated Goals To relieve LLE pain, improve walking    Pain Onset More than a month ago                               [facility] Adult PT Treatment/Exercise - 09/17/20 0001       Transfers   Transfers Sit to Stand;Stand to Sit    Sit to Stand 6: Modified independent (Device/Increase time)    Stand to Sit 6: Modified  independent (Device/Increase time)      Ambulation/Gait   Ambulation/Gait Yes    Ambulation/Gait Assistance 5: Supervision    Ambulation/Gait Assistance Details decreased L hip extension    Ambulation Distance (Feet) 115 Feet    Assistive device None    Ambulation Surface Level;Indoor      Lumbar Exercises: Stretches   Hip Flexor Stretch 3 reps;30 seconds;Limitations    Hip Flexor Stretch Limitations PT assist to maintain knee against chest      Lumbar Exercises: Seated   Other Seated Lumbar Exercises Core exercises of hip/shoulder tosses, chops and Vs with 4.4# ball, 10 reps each      Lumbar Exercises: Sidelying   Hip Abduction Left;10 reps;Limitations    Hip Abduction Limitations 2x10 with PT maintaining sidelie poisition and slight hip extension to isolate gluteus medius      Knee/Hip Exercises: Stretches   Other Knee/Hip Stretches L hip flexor stretch, 30sx3 with tactile assist from PT      Manual Therapy   Manual Therapy Soft tissue mobilization    Manual therapy comments STM to L piriformis in R sidelie, 8'                     PT Education - 09/17/20 1318     Education Details Instructed in trigger point release to address L quad trigger points 2-5 ' duration    Person(s) Educated Patient    Methods Explanation;Demonstration    Comprehension Verbalized understanding;Returned demonstration              PT Short Term Goals - 08/30/20 1249       PT SHORT TERM GOAL #1   Title Pt will demonstrate independence with updated and  revised HEP    Time 4    Period Weeks    Status New    Target Date 09/16/20      PT SHORT TERM GOAL #2   Title Pt will demonstrate 10 degrees increase in L hamstring ROM and L hip flexor ROM    Baseline 60 deg hamstring, lacking 20 deg to neutral hip extension LLE Thomas test; 08/30/20 L hamstring ROM 90d in supine    Time 4    Period Weeks    Status New    Target Date 09/16/20      PT SHORT TERM GOAL #3   Title Pt will participate in stair negotiation assessment with LTG to be set    Baseline TBD    Time 4    Period Weeks    Status New    Target Date 09/16/20      PT SHORT TERM GOAL #4   Title Pt will decrease five time sit to stand to </= 15 seconds without UE support    Baseline 18.2 seconds    Time 4    Period Weeks    Status New    Target Date 09/16/20      PT SHORT TERM GOAL #5   Title Pt will increase gait velocity to >/= 2.8 ft/sec without AD and with decreased antalgic gait sequence    Baseline 2.46 ft/sec    Time 4    Period Weeks    Status New    Target Date 09/16/20               PT Long Term Goals - 08/24/20 1514       PT LONG TERM GOAL #1   Title Pt will be independent with final HEP and  walking program at [facility]    Time 8    Period Weeks    Status New      PT LONG TERM GOAL #2   Title Pt will negotiate 16 stairs with one rail, alternating sequence MOD I    Time 8    Period Weeks    Status New      PT LONG TERM GOAL #3   Title Pt will demonstrate symmetrical hamstring length and hip extension (Thomas test) L to R side    Time 8    Period Weeks    Status New      PT LONG TERM GOAL #4   Title Pt will decrease five time sit to stand to </=13 seconds to indicate decreased falls risk    Time 8    Period Weeks    Status New      PT LONG TERM GOAL #5   Title Pt will increase gait velocity to >/= 3.0 ft/sec without AD    Time 8    Period Weeks    Status New                   Plan - 09/17/20 1320     Clinical Impression  Statement todays session addressed ongoing issues in L thigh and gluteal regions including self mobilization of L quad trigger points using rolling pin as well as deep tissue release to L piriformis.  Assisted patient with abduction strengthening by stabilizing him in sidelie and holding L hip in extension to isolate gluteus medius.  Reviewed squatting to emphasize lumbar extension.  Less stretch reported during assisted hip flexor stretch on L.  SI alignment stable    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    Examination-Activity Limitations [place];Locomotion Level;Stairs;Stand    Examination-Participation Restrictions Community Activity;Other   Fitness   Stability/Clinical Decision Making Evolving/Moderate complexity    Rehab Potential Good    PT Frequency 2x / week    PT Duration 8 weeks    PT Treatment/Interventions ADLs/Self Care Home Management;Aquatic Therapy;DME Instruction;Gait training;Stair training;Functional mobility training;Therapeutic activities;Therapeutic exercise;Balance training;Neuromuscular re-education;Patient/family education;Orthotic Fit/Training;Cryotherapy;Moist Heat;Manual techniques;Passive range of motion;Dry needling;Taping    PT Next Visit Plan Monitor pelvic alignment, manual STM to L priformis, L hip flexor stretching, STG progress, L quad trigger points    PT Home Exercise Plan [email or web address]    Consulted and Agree with Plan of Care Patient             Patient will benefit from skilled therapeutic intervention in order to improve the following deficits and  impairments:  Decreased activity tolerance, Decreased balance, Cardiopulmonary status limiting activity, Decreased endurance, Decreased range of motion, Decreased strength, Difficulty walking, Impaired sensation, Postural dysfunction, Pain  Visit Diagnosis: Muscle weakness (generalized)  Sciatica, left side  Unsteadiness on feet     Problem List Patient Active Problem List   Diagnosis Date Noted   Syncope 05/26/2020   Back pain [id number]   Acute metabolic encephalopathy [id number]   Lumbar spinal stenosis 08/15/2019   Nonspecific chest pain 08/11/2019   Shortness of breath    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 ([place]) 10/21/2018   Nocturnal hypoxemia 10/21/2018  Radiation therapy complication [id number]   Primary prostate cancer ([place]) 07/31/2017   Anemia 07/31/2017   Vertigo due to cerebrovascular disease 07/31/2017   Poor compliance with CPAP treatment 07/31/2017   Absolute anemia 05/16/2017   Avitaminosis D 05/16/2017   Benign essential HTN 05/16/2017   Benign prostatic hypertrophy without urinary obstruction 05/16/2017   Clinical depression 05/16/2017   CN (constipation) 05/16/2017   Current drug use 05/16/2017   Diabetic neuropathy ([place]) 05/16/2017   Genital herpes 05/16/2017   Infarction of lung due to iatrogenic pulmonary embolism ([place]) 05/16/2017   Sciatica associated with disorder of lumbar spine 05/16/2017   Arteriosclerosis of coronary artery 05/16/2017   Artery disease, cerebral 05/16/2017   Apnea, sleep 05/16/2017   Arthralgia of hip or thigh 05/16/2017   Malignant neoplasm of prostate ([place]) 09/28/2016   Vascular dementia in remission ([place]) 09/28/2016   Remote history of stroke 09/28/2016   Acute kidney injury ([place]) 05/18/2016   Dehydration 05/18/2016   Near syncope 05/18/2016   Orthostatic hypotension 05/18/2016   CKD (chronic kidney disease), stage II 04/26/2016   UTI (urinary tract infection) 04/26/2016   Encounter for counseling  on use of CPAP 11/18/2015   Chronic cholecystitis with calculus 11/02/2015   Stroke, 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[place]) 04/22/2015   TIA (transient ischemic attack) 11/03/2014   OSA on CPAP 05/18/2014   White matter disease 08/14/2013   Abnormal x-ray of temporomandibular joint 05/30/2013   Chronic infection of sinus 05/30/2013   Cough 05/30/2013   Fatigue 05/30/2013   Unsteady gait 05/19/2013   Combined fat and carbohydrate induced hyperlipemia 04/24/2013   Syncope 03/20/2013   Angina pectoris ([place]) 10/08/2012   Hypertension    Stroke ([place])    History of TIAs    Lumbago    Other and unspecified hyperlipidemia    Personal history of unspecified circulatory disease    Unwitnessed fall    Pain in joint, multiple sites    Degeneration of cervical intervertebral disc    Unspecified cardiovascular disease    History of pulmonary embolism: June 2014,  Takes Xarelto 07/01/2012    Class: History of   Hemiparesis ([place]) 07/18/2011   Diabetes mellitus type 2 with complications ([place]) [id number]   CAD in native artery 07/18/2011   History of recurrent TIAs 07/18/2011    Lanice Shirts, PT 09/17/2020, 1:58 PM  [facility] [address] [place], [place], [address] Phone: [phone]   Fax:  [phone]  Name: Barron Binner MRN: [id number] Date of Birth: July 28, 1940

## 2020-09-20 ENCOUNTER — Ambulatory Visit: Payer: 59

## 2020-09-20 DIAGNOSIS — L989 Disorder of the skin and subcutaneous tissue, unspecified: Secondary | ICD-10-CM | POA: Diagnosis not present

## 2020-09-24 ENCOUNTER — Ambulatory Visit: Payer: 59

## 2020-09-27 ENCOUNTER — Emergency Department (HOSPITAL_COMMUNITY)
Admission: EM | Admit: 2020-09-27 | Discharge: 2020-09-27 | Disposition: A | Discharge status: Home / Self Care | Payer: Medicare Other | Attending: Emergency Medicine | Admitting: Emergency Medicine

## 2020-09-27 ENCOUNTER — Encounter (HOSPITAL_COMMUNITY): Payer: Self-pay

## 2020-09-27 DIAGNOSIS — Z7901 Long term (current) use of anticoagulants: Secondary | ICD-10-CM | POA: Diagnosis not present

## 2020-09-27 DIAGNOSIS — R9431 Abnormal electrocardiogram [ECG] [EKG]: Secondary | ICD-10-CM | POA: Diagnosis not present

## 2020-09-27 DIAGNOSIS — Z7952 Long term (current) use of systemic steroids: Secondary | ICD-10-CM | POA: Insufficient documentation

## 2020-09-27 DIAGNOSIS — Z794 Long term (current) use of insulin: Secondary | ICD-10-CM | POA: Diagnosis not present

## 2020-09-27 DIAGNOSIS — I1310 Hypertensive heart and chronic kidney disease without heart failure, with stage 1 through stage 4 chronic kidney disease, or unspecified chronic kidney disease: Secondary | ICD-10-CM | POA: Insufficient documentation

## 2020-09-27 DIAGNOSIS — I2510 Atherosclerotic heart disease of native coronary artery without angina pectoris: Secondary | ICD-10-CM | POA: Diagnosis not present

## 2020-09-27 DIAGNOSIS — R5383 Other fatigue: Secondary | ICD-10-CM | POA: Diagnosis not present

## 2020-09-27 DIAGNOSIS — N182 Chronic kidney disease, stage 2 (mild): Secondary | ICD-10-CM | POA: Insufficient documentation

## 2020-09-27 DIAGNOSIS — R569 Unspecified convulsions: Secondary | ICD-10-CM | POA: Diagnosis not present

## 2020-09-27 DIAGNOSIS — E1122 Type 2 diabetes mellitus with diabetic chronic kidney disease: Secondary | ICD-10-CM | POA: Diagnosis not present

## 2020-09-27 DIAGNOSIS — R4020 Unspecified coma: Secondary | ICD-10-CM | POA: Diagnosis not present

## 2020-09-27 DIAGNOSIS — R55 Syncope and collapse: Secondary | ICD-10-CM | POA: Diagnosis not present

## 2020-09-27 DIAGNOSIS — E162 Hypoglycemia, unspecified: Secondary | ICD-10-CM | POA: Insufficient documentation

## 2020-09-27 DIAGNOSIS — Z8546 Personal history of malignant neoplasm of prostate: Secondary | ICD-10-CM | POA: Insufficient documentation

## 2020-09-27 DIAGNOSIS — E161 Other hypoglycemia: Secondary | ICD-10-CM | POA: Diagnosis not present

## 2020-09-27 DIAGNOSIS — Z79899 Other long term (current) drug therapy: Secondary | ICD-10-CM | POA: Diagnosis not present

## 2020-09-27 DIAGNOSIS — E160 Drug-induced hypoglycemia without coma: Secondary | ICD-10-CM

## 2020-09-27 DIAGNOSIS — R410 Disorientation, unspecified: Secondary | ICD-10-CM | POA: Diagnosis not present

## 2020-09-27 DIAGNOSIS — E11649 Type 2 diabetes mellitus with hypoglycemia without coma: Secondary | ICD-10-CM | POA: Diagnosis not present

## 2020-09-27 DIAGNOSIS — R5381 Other malaise: Secondary | ICD-10-CM | POA: Diagnosis not present

## 2020-09-27 LAB — CBC WITH DIFFERENTIAL/PLATELET
Abs Immature Granulocytes: 0.01 10*3/uL (ref 0.00–0.07)
Basophils Absolute: 0 10*3/uL (ref 0.0–0.1)
Basophils Relative: 1 %
Eosinophils Absolute: 0 10*3/uL (ref 0.0–0.5)
Eosinophils Relative: 1 %
HCT: 40.5 % (ref 39.0–52.0)
Hemoglobin: 13.5 g/dL (ref 13.0–17.0)
Immature Granulocytes: 0 %
Lymphocytes Relative: 40 %
Lymphs Abs: 1.5 10*3/uL (ref 0.7–4.0)
MCH: 31 pg (ref 26.0–34.0)
MCHC: 33.3 g/dL (ref 30.0–36.0)
MCV: 93.1 fL (ref 80.0–100.0)
Monocytes Absolute: 0.5 10*3/uL (ref 0.1–1.0)
Monocytes Relative: 14 %
Neutro Abs: 1.7 10*3/uL (ref 1.7–7.7)
Neutrophils Relative %: 44 %
Platelets: 168 10*3/uL (ref 150–400)
RBC: 4.35 MIL/uL (ref 4.22–5.81)
RDW: 13.7 % (ref 11.5–15.5)
WBC: 3.9 10*3/uL — ABNORMAL LOW (ref 4.0–10.5)
nRBC: 0 % (ref 0.0–0.2)

## 2020-09-27 LAB — BASIC METABOLIC PANEL
Anion gap: 10 (ref 5–15)
BUN: 13 mg/dL (ref 8–23)
CO2: 25 mmol/L (ref 22–32)
Calcium: 8.8 mg/dL — ABNORMAL LOW (ref 8.9–10.3)
Chloride: 100 mmol/L (ref 98–111)
Creatinine, Ser: 1.4 mg/dL — ABNORMAL HIGH (ref 0.61–1.24)
GFR, Estimated: 51 mL/min — ABNORMAL LOW (ref >60)
Glucose, Bld: 168 mg/dL — ABNORMAL HIGH (ref 70–99)
Potassium: 3.9 mmol/L (ref 3.5–5.1)
Sodium: 135 mmol/L (ref 135–145)

## 2020-09-27 LAB — CBG MONITORING, ED
Glucose-Capillary: 162 mg/dL — ABNORMAL HIGH (ref 70–99)
Glucose-Capillary: 99 mg/dL (ref 70–99)

## 2020-09-27 NOTE — ED Provider Notes (Signed)
[facility] DEPT Provider Note   CSN: [id number] Arrival date & time: 09/27/20  1612     History Chief Complaint  Patient presents with   Loss of Consciousness    Alfonza Briese is a 80 y.o. male.  The history is provided by the patient and medical records. No language interpreter was used.  Loss of Consciousness Episode history:  Single Most recent episode:  Today Timing:  Unable to specify Progression:  Resolved Chronicity:  New Context: medication change (accidental med change)   Witnessed: yes   Relieved by:  Nothing Worsened by:  Nothing Associated symptoms: malaise/fatigue   Associated symptoms: no chest pain, no confusion, no diaphoresis, no difficulty breathing, no dizziness, no fever, no headaches, no nausea, no palpitations, no recent fall, no recent injury, no seizures, no shortness of breath, no vomiting and no weakness   Risk factors: coronary artery disease   Risk factors: no seizures       Past Medical History:  Diagnosis Date   Abnormal prostate biopsy    Anticoagulant long-term use    currently xarelto   BPH with elevated PSA    CKD (chronic kidney disease), stage II    Complication of anesthesia    limted neck rom limited use of left arm due to cva   Coronary artery disease    CARDIOLOGIST-  DR Irish Lack--  2010-- PCI w/ stenting midLAD   DDD (degenerative disc disease), lumbar    Degeneration of cervical intervertebral disc    Depression    Diabetes mellitus without complication (HCC)    Dyspnea on exertion    GERD (gastroesophageal reflux disease)    Hemiparesis due to cerebral infarction    History of cerebrovascular accident (CVA) with residual deficit 2002 and 2003--  hemiparisis both sides   per MRI  anterior left frontal lobe, left para midline pons, and inferior cerebullam bilaterally infarcts   History of pulmonary embolus (PE)    06-30-2012  extensive bilaterally   History of recurrent TIAs    History  of syncope    hx multiple pre-syncope and syncopal episodes due to vasovagal, orthostatic hypotension, dehydration   History of TIAs    several since 2002   Hyperlipidemia    Hypertension    Mild atherosclerosis of carotid artery, bilateral    per last duplex 11-04-2014  bilateral ICA 1--39%   Neuropathy    fingers   OSA on CPAP    followed by dr dohmeier--  sev. osa w/ AHI 65.9   Prostate cancer ([place]) dx 2018   Renal insufficiency    S/P coronary artery stent placement 2010   stenting to mid LAD   Simple renal cyst    bilaterally   Stroke [facility])    Trigger finger of both hands 11-17-13   Type 2 diabetes mellitus ([place]) dx 1986   last one A1c 9.2 on 04-26-2016   Unsteady gait    . Hx prior CVA/TIAs;   Vertebral artery occlusion, left    chronic    Patient Active Problem List   Diagnosis Date Noted   Syncope 05/26/2020   Back pain [id number]   Acute metabolic encephalopathy [id number]   Lumbar spinal stenosis 08/15/2019   Nonspecific chest pain 08/11/2019   Shortness of breath    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 ([place]) 10/21/2018   Nocturnal hypoxemia 10/21/2018   Radiation therapy complication [id number]   Primary prostate cancer ([place]) 07/31/2017   Anemia 07/31/2017   Vertigo due to cerebrovascular disease  07/31/2017   Poor compliance with CPAP treatment 07/31/2017   Absolute anemia 05/16/2017   Avitaminosis D 05/16/2017   Benign essential HTN 05/16/2017   Benign prostatic hypertrophy without urinary obstruction 05/16/2017   Clinical depression 05/16/2017   CN (constipation) 05/16/2017   Current drug use 05/16/2017   Diabetic neuropathy ([place]) 05/16/2017   Genital herpes 05/16/2017   Infarction of lung due to iatrogenic pulmonary embolism ([place]) 05/16/2017   Sciatica associated with disorder of lumbar spine 05/16/2017   Arteriosclerosis of coronary artery 05/16/2017   Artery disease, cerebral 05/16/2017   Apnea, sleep 05/16/2017   Arthralgia of  hip or thigh 05/16/2017   Malignant neoplasm of prostate ([place]) 09/28/2016   Vascular dementia in remission ([place]) 09/28/2016   Remote history of stroke 09/28/2016   Acute kidney injury ([place]) 05/18/2016   Dehydration 05/18/2016   Near syncope 05/18/2016   Orthostatic hypotension 05/18/2016   CKD (chronic kidney disease), stage II 04/26/2016   UTI (urinary tract infection) 04/26/2016   Encounter for counseling on use of CPAP 11/18/2015   Chronic cholecystitis with calculus 11/02/2015   Stroke, 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[place]) 04/22/2015   TIA (transient ischemic attack) 11/03/2014   OSA on CPAP 05/18/2014   White matter disease 08/14/2013   Abnormal x-ray of temporomandibular joint 05/30/2013   Chronic infection of sinus 05/30/2013   Cough 05/30/2013   Fatigue 05/30/2013   Unsteady gait 05/19/2013   Combined fat and carbohydrate induced hyperlipemia 04/24/2013   Syncope 03/20/2013   Angina pectoris ([place]) 10/08/2012   Hypertension    Stroke ([place])    History of TIAs    Lumbago    Other and unspecified hyperlipidemia    Personal history of unspecified circulatory disease    Unwitnessed fall    Pain in joint, multiple sites    Degeneration of cervical intervertebral disc    Unspecified cardiovascular disease    History of pulmonary embolism: June 2014,  Takes Xarelto 07/01/2012    Class: History of   Hemiparesis ([place]) 07/18/2011   Diabetes mellitus type 2 with complications ([place]) [id number]   CAD in native artery 07/18/2011   History of recurrent TIAs 07/18/2011    Past Surgical History:  Procedure Laterality Date   ANTERIOR CERVICAL DECOMP/DISCECTOMY FUSION  2004   C3 -- C6 limited rom   CARDIAC CATHETERIZATION  06-10-2010   dr Irish Lack   wide patent LAD stent, mid lesion at the origin of the septal prior to the previous stent 40-50%/  normal LVF, ef 55%   CARDIOVASCULAR STRESS TEST  10-23-2012  dr Irish Lack   normal nuclear perfusion study w/ no ischemia/  normal LV function and wall  motion , ef 65%   CARPAL TUNNEL RELEASE Bilateral    CATARACT EXTRACTION W/ INTRAOCULAR LENS  IMPLANT, BILATERAL     CHOLECYSTECTOMY N/A 11/02/2015   Procedure: LAPAROSCOPIC CHOLECYSTECTOMY WITH INTRAOPERATIVE CHOLANGIOGRAM;  Surgeon: Donnie Mesa, MD;  Location: [facility];  Service: General;  Laterality: N/A;   COLONOSCOPY     CORONARY ANGIOPLASTY WITH STENT PLACEMENT  02/2008   stenting to mid LAD   GOLD SEED IMPLANT N/A 11/15/2016   Procedure: [place];  Surgeon: Ardis Hughs, MD;  Location: [facility];  Service: Urology;  Laterality: N/A;   IR ANGIO INTRA EXTRACRAN SEL COM CAROTID INNOMINATE BILAT MOD SED  06/13/2018   IR ANGIO VERTEBRAL SEL VERTEBRAL UNI R MOD SED  06/13/2018   IR US GUIDE VASC ACCESS RIGHT  06/13/2018   LEFT  HEART CATH AND CORONARY ANGIOGRAPHY N/A 05/25/2017   Procedure: LEFT HEART CATH AND CORONARY ANGIOGRAPHY;  Surgeon: Jettie Booze, MD;  Location: [facility];  Service: Cardiovascular;  Laterality: N/A;   LEFT HEART CATHETERIZATION WITH CORONARY ANGIOGRAM N/A 04/03/2013   Procedure: LEFT HEART CATHETERIZATION WITH CORONARY ANGIOGRAM;  Surgeon: Jettie Booze, MD;  Location: [facility] CATH LAB;  Service: Cardiovascular;  Laterality: N/A;  patent mLAD stent  w/ mild disease in remainder LAD and its branches;  mod. focal lesion midLCFx- FFR of lesion was negative for ischemia/  normal LVSF, ef 50%   lungs  2005   "fluid pumped off lungs"   NEUROPLASTY / TRANSPOSITION ULNAR NERVE AT ELBOW Right 2004   PROSTATE BIOPSY N/A 08/31/2016   Procedure: PROSTATE  BIOPSY TRANSRECTAL ULTRASONIC PROSTATE (TUBP);  Surgeon: Ardis Hughs, MD;  Location: [facility];  Service: Urology;  Laterality: N/A;   SPACE OAR INSTILLATION N/A 11/15/2016   Procedure: SPACE OAR INSTILLATION;  Surgeon: Ardis Hughs, MD;  Location: [facility];  Service: Urology;  Laterality: N/A;   TRANSTHORACIC ECHOCARDIOGRAM   04/27/2016   severe focal basal LVH, ef 60-65%,  grade 2 diastoilc dysfunction/  mild AR, MR, and TR/  atrial septum lipomatous hypertrophy/  PASP [id number]   UMBILICAL HERNIA REPAIR         Family History  Problem Relation Age of Onset   Aneurysm Mother    Cancer Father        unknown either pancreatic or prostate   Stroke Brother    Dementia Sister    Heart attack Neg Hx     Social History   Tobacco Use   Smoking status: Never   Smokeless tobacco: Never  Vaping Use   Vaping Use: Never used  Substance Use Topics   Alcohol use: Not Currently   Drug use: Never    Home Medications Prior to Admission medications   Medication Sig Start Date End Date Taking? Authorizing Provider  acetaminophen (TYLENOL) 500 MG tablet Take 500 mg by mouth every 6 (six) hours as needed (pain).    [provider]  albuterol (VENTOLIN HFA) 108 (90 Base) MCG/ACT inhaler Inhale 1-2 puffs into the lungs every 6 (six) hours as needed for wheezing or shortness of breath. 09/06/20   Ward, Lenise Arena, PA-C  bisacodyl (DULCOLAX) 10 MG suppository Place 1 suppository (10 mg total) rectally daily as needed for moderate constipation. 05/31/20   Antonieta Pert, MD  diclofenac Sodium (VOLTAREN) 1 % GEL Apply 4 g topically 4 (four) times daily. 09/03/19   Deno Etienne, DO  fluticasone (FLONASE) 50 MCG/ACT nasal spray Place 1 spray into both nostrils daily as needed for allergies. 06/13/20   Medina-Vargas, Monina C, NP  furosemide (LASIX) 20 MG tablet Take 1-2 tablets (20-40 mg total) by mouth daily as needed for fluid. 06/13/20   Medina-Vargas, Monina C, NP  hydrochlorothiazide (MICROZIDE) 12.5 MG capsule Take 1 capsule (12.5 mg total) by mouth daily. 06/13/20   Medina-Vargas, Monina C, NP  insulin aspart (NOVOLOG) 100 UNIT/ML FlexPen Inject 5 Units into the skin 3 (three) times daily with meals. 06/13/20   Medina-Vargas, Monina C, NP  losartan (COZAAR) 100 MG tablet Take 1 tablet (100 mg total) by mouth daily. 06/13/20    Medina-Vargas, Monina C, NP  methocarbamol (ROBAXIN) 500 MG tablet Take 1 tablet (500 mg total) by mouth every 8 (eight) hours as needed for muscle spasms. 06/13/20   Medina-Vargas, Senaida Lange, NP  modafinil (PROVIGIL) 100 MG tablet Take 1 tablet (100 mg total) by mouth daily. 08/23/20   Ward Givens, NP  oxyCODONE (OXY IR/ROXICODONE) 5 MG immediate release tablet Take 1 tablet (5 mg total) by mouth every 6 (six) hours as needed for up to 6 doses for severe pain. 06/08/20   Medina-Vargas, Monina C, NP  pantoprazole (PROTONIX) 40 MG tablet Take 1 tablet (40 mg total) by mouth every morning. 06/13/20   Medina-Vargas, Monina C, NP  polyethylene glycol (MIRALAX / GLYCOLAX) 17 g packet Take 17 g by mouth daily. 05/31/20   Antonieta Pert, MD  pregabalin (LYRICA) 300 MG capsule Take 300 mg by mouth 2 (two) times daily. 04/26/20   [provider]  rosuvastatin (CRESTOR) 10 MG tablet Take 1 tablet (10 mg total) by mouth daily. 06/13/20   Medina-Vargas, Monina C, NP  sitaGLIPtin (JANUVIA) 50 MG tablet Take 1 tablet (50 mg total) by mouth daily. 06/13/20   Medina-Vargas, Monina C, NP  TRESIBA FLEXTOUCH 100 UNIT/ML FlexTouch Pen Inject 34 Units into the skin daily. 06/13/20   Medina-Vargas, Monina C, NP  XARELTO 20 MG TABS tablet Take 1 tablet (20 mg total) by mouth daily. 06/13/20   Medina-Vargas, Monina C, NP    Allergies    Other, Phenergan [promethazine], Iohexol, Iohexol, Phenergan [promethazine], and Tramadol  Review of Systems   Review of Systems  Constitutional:  Positive for fatigue and malaise/fatigue. Negative for chills, diaphoresis and fever.  HENT:  Negative for congestion.   Eyes:  Negative for visual disturbance.  Respiratory:  Negative for cough, chest tightness, shortness of breath and wheezing.   Cardiovascular:  Positive for syncope. Negative for chest pain and palpitations.  Gastrointestinal:  Negative for abdominal pain, constipation, diarrhea, nausea and vomiting.  Genitourinary:  Negative  for dysuria and flank pain.  Musculoskeletal:  Negative for back pain, neck pain and neck stiffness.  Skin:  Negative for rash and wound.  Neurological:  Positive for syncope and light-headedness. Negative for dizziness, seizures, speech difficulty, weakness and headaches.  Psychiatric/Behavioral:  Negative for agitation and confusion.   All other systems reviewed and are negative.  Physical Exam Updated Vital Signs BP 132/87 (BP Location: Left Arm)   Pulse (!) 52   Temp 98.2 F (36.8 C) (Oral)   Resp 18   SpO2 98%   Physical Exam Vitals and nursing note reviewed.  Constitutional:      General: He is not in acute distress.    Appearance: He is well-developed. He is not ill-appearing, toxic-appearing or diaphoretic.  HENT:     Head: Normocephalic and atraumatic.     Nose: No congestion or rhinorrhea.     Mouth/Throat:     Mouth: Mucous membranes are moist.     Pharynx: No oropharyngeal exudate or posterior oropharyngeal erythema.  Eyes:     Extraocular Movements: Extraocular movements intact.     Conjunctiva/sclera: Conjunctivae normal.     Pupils: Pupils are equal, round, and reactive to light.  Cardiovascular:     Rate and Rhythm: Normal rate and regular rhythm.     Pulses: Normal pulses.     Heart sounds: No murmur heard. Pulmonary:     Effort: Pulmonary effort is normal. No respiratory distress.     Breath sounds: Normal breath sounds. No wheezing, rhonchi or rales.  Chest:     Chest wall: No tenderness.  Abdominal:     General: Abdomen is flat. There is no distension.     Palpations: Abdomen is soft.  Tenderness: There is no abdominal tenderness. There is no guarding or rebound.  Musculoskeletal:        General: No tenderness.     Cervical back: Neck supple. No tenderness.     Right lower leg: No edema.     Left lower leg: No edema.  Skin:    General: Skin is warm and dry.     Capillary Refill: Capillary refill takes less than 2 seconds.     Coloration: Skin  is not pale.     Findings: No erythema.  Neurological:     General: No focal deficit present.     Mental Status: He is alert.     Cranial Nerves: No cranial nerve deficit.     Sensory: No sensory deficit.     Motor: No weakness.  Psychiatric:        Mood and Affect: Mood normal.    ED Results / Procedures / Treatments   Labs (all labs ordered are listed, but only abnormal results are displayed) Labs Reviewed  CBC WITH DIFFERENTIAL/PLATELET - Abnormal; Notable for the following components:      Result Value   WBC 3.9 (*)    All other components within normal limits  BASIC METABOLIC PANEL - Abnormal; Notable for the following components:   Glucose, Bld 168 (*)    Creatinine, Ser 1.40 (*)    Calcium 8.8 (*)    GFR, Estimated 51 (*)    All other components within normal limits  CBG MONITORING, ED - Abnormal; Notable for the following components:   Glucose-Capillary 162 (*)    All other components within normal limits  CBG MONITORING, ED    EKG EKG Interpretation  Date/Time:  Monday September 27 2020 19:12:33 EDT Ventricular Rate:  53 PR Interval:  169 QRS Duration: 102 QT Interval:  436 QTC Calculation: 410 R Axis:   -1 Text Interpretation: Sinus rhythm Low voltage, precordial leads RSR' in V1 or V2, right VCD or RVH When compared to prior, similar appearance with slower rate. No STEMI Confirmed by Antony Blackbird [phone]) on 09/27/2020 7:15:32 PM  Radiology No results found.  Procedures Procedures   Medications Ordered in ED Medications - No data to display  ED Course  I have reviewed the triage vital signs and the nursing notes.  Pertinent labs & imaging results that were available during my care of the patient were reviewed by me and considered in my medical decision making (see chart for details).    MDM Rules/Calculators/A&P                           Quentyn Chevalier is a 80 y.o. male with a past medical history significant for CAD, diabetes,  hypertension, TIA/stroke, PE on Xarelto, CKD, sleep apnea, prostate cancer, vascular dementia, and vertigo who presents with syncope.  According to the patient, he found out the news this morning that his sister is unresponsive in a hospital and [place] and the family is trying to figure out next steps in her care.  Patient said that this morning around 1030 or 11 AM, he was distracted while getting ready his breakfast and accidentally took 26 units of NovoLog instead of his normal 15 units.  He says that he then was running some errands and during one of his parents, he remembers waking up on the ground with people around him.  He denies a postictal period and does not report any traumatic injuries.  He denies  any preceding palpitations, chest pain, shortness of breath.  He reports no diaphoresis, nausea, vomiting, anxiety, or stress.  He still feels tired but otherwise is now feeling back to his baseline.  That was approximately 5 or 6 hours ago that the patient took the medications incorrectly.  He denies any preceding symptoms for the last few days and just feels very anxious about everything this morning.  On exam, lungs clear and chest nontender.  Abdomen is nontender.  Patient was bradycardic on arrival, will get EKG.  Good pulses in extremities.  No focal neurologic deficits.  He denies any neurologic complaints otherwise.  Patient otherwise well-appearing.  EKG showed no evidence of STEMI or significant arrhythmia.  Was slightly bradycardic but that improved at times during my conversation with him on monitoring.      Per EMS report to nursing, patient already had glucose tablets in his mouth and initial CBG was 120 but then dropped to 80 so he was given some D10 W.  Patient reports that he is feeling back to his baseline now aside from some mild fatigue.  Patient reports that he is anxious to leave when he is able to do so.  Clinically I do suspect that he accidentally cause hypoglycemia as he  was distracted take his medications.  He recognize this and reports he will avoid that in the future.  We will monitor him and get basic labs and check a CBG again as he has been here for nearly an hour and a half now.  His labs are reassuring he is able to eat and drink without hypoglycemia or symptoms, anticipate discharge with close follow-up   7:10 PM Patient's work-up returned reassuring.  Blood work similar to prior.  No evidence of AKI or significant electrolyte abnormality.  Glucose remained stable after several hours of observation.  Patient was able to pass a p.o. challenge and was feeling well.  We agreed to hold on urine test, x-rays, or CT imaging this time.  He would like to go home to help take care of his family.  Patient advised to follow-up with his PCP and be careful with his glucose medications.  We agreed to hold on further work-up at this time.  He understood return precautions and plan of care and was discharged in good condition.   Final Clinical Impression(s) / ED Diagnoses Final diagnoses:  None    Clinical Impression: 1. Syncope, unspecified syncope type   2. Hypoglycemia due to insulin     Disposition: Discharge  Condition: Good  I have discussed the results, Dx and Tx plan with the pt(& family if present). He/she/they expressed understanding and agree(s) with the plan. Discharge instructions discussed at great length. Strict return precautions discussed and pt &/or family have verbalized understanding of the instructions. No further questions at time of discharge.    New Prescriptions   No medications on file    Follow Up: Lawerance Cruel, MD [place] [id number] [phone]     [facility]-EMERGENCY DEPT [place] [id number] mc [place]        Garnetta Fedrick, Gwenyth Allegra, MD 09/27/20 [phone]

## 2020-09-27 NOTE — ED Triage Notes (Signed)
Per EM- Patient accidentally grabbed 26 units Novolog instead of his long acting insulin. When EMS arrived, patient had glucose tabs in his mouth. Patients initial CBG was 120 and after 5 minutes the CBG dropped  to 80. Patient was given D10W.

## 2020-09-27 NOTE — ED Provider Notes (Signed)
HavingEmergency Medicine Provider Triage Evaluation Note  Steve Brown , a 81 y.o. male  was evaluated in triage.  Pt arrives via EMS after loss of consciousness earlier today.  Patient was in the car, going to pick up his grandson.  Patient took 26 units of NovoLog instead of his long-acting insulin.  At time of arrival of EMS patient was taking some oral glucose tablets in his mouth.  Initial CBG was 120, dropped 40 points within 5 minutes.  Patient some D10W in route.  Review of Systems  Positive: LOC, weakness Negative: Nausea, vomiting, chest pain, shortness of breath  Physical Exam  SpO2 98%  Gen:   Awake, no distress   Resp:  Normal effort  MSK:   Moves extremities without difficulty  Other:    Medical Decision Making  Medically screening exam initiated at 4:34 PM.  Appropriate orders placed.  Deon Hacking was informed that the remainder of the evaluation will be completed by another provider, this initial triage assessment does not replace that evaluation, and the importance of remaining in the ED until their evaluation is complete.  Hypoglycemia, LOC   Anselmo Pickler, PA-C 09/27/20 1636    Drenda Freeze, MD 09/27/20 [phone]

## 2020-09-27 NOTE — Discharge Instructions (Signed)
Your history, exam, work-up today were overall reassuring and your syncopal episode likely fits with the hypoglycemia you experienced after accidentally taking too much insulin.  We observed for several hours and feel you are now safe for discharge home given the resolution of your symptoms and otherwise well appearance.  Your other labs were similar to prior.  We agreed to hold on more extensive work-up at this time given your lack of any preceding symptoms and otherwise reassuring exam.  Please be careful taking her medications and follow-up with your primary doctor.  If any symptoms change or worsen acutely, please return to the nearest emergency department.

## 2020-09-28 ENCOUNTER — Other Ambulatory Visit: Payer: Self-pay

## 2020-09-28 ENCOUNTER — Ambulatory Visit: Payer: Medicare Other

## 2020-09-28 VITALS — BP 108/58 | HR 73

## 2020-09-28 DIAGNOSIS — M6281 Muscle weakness (generalized): Secondary | ICD-10-CM | POA: Diagnosis not present

## 2020-09-28 DIAGNOSIS — R2689 Other abnormalities of gait and mobility: Secondary | ICD-10-CM

## 2020-09-28 DIAGNOSIS — R2681 Unsteadiness on feet: Secondary | ICD-10-CM

## 2020-09-28 DIAGNOSIS — M5432 Sciatica, left side: Secondary | ICD-10-CM | POA: Diagnosis not present

## 2020-09-28 NOTE — Therapy (Signed)
[facility] [address] [place], [place], [address] Phone: [phone]   Fax:  [phone]  Physical Therapy Treatment  Patient Details  Name: Steve Brown MRN: [id number] Date of Birth: 1940/08/06 Referring Provider (PT): Lawerance Cruel, MD   Encounter Date: 09/28/2020   PT End of Session - 09/28/20 1312     Visit Number 7    Number of Visits 17    Date for PT Re-Evaluation 10/15/20    Authorization Type Medicare A&B; 10th visit PN    Progress Note Due on Visit 10    PT Start Time 55    PT Stop Time 1315    PT Time Calculation (min) 45 min    Equipment Utilized During Treatment Gait belt    Activity Tolerance Patient tolerated treatment well    Behavior During Therapy WFL for tasks assessed/performed             Past Medical History:  Diagnosis Date   Abnormal prostate biopsy    Anticoagulant long-term use    currently xarelto   BPH with elevated PSA    CKD (chronic kidney disease), stage II    Complication of anesthesia    limted neck rom limited use of left arm due to cva   Coronary artery disease    CARDIOLOGIST-  DR Irish Lack--  2010-- PCI w/ stenting midLAD   DDD (degenerative disc disease), lumbar    Degeneration of cervical intervertebral disc    Depression    Diabetes mellitus without complication (HCC)    Dyspnea on exertion    GERD (gastroesophageal reflux disease)    Hemiparesis due to cerebral infarction    History of cerebrovascular accident (CVA) with residual deficit 2002 and 2003--  hemiparisis both sides   per MRI  anterior left frontal lobe, left para midline pons, and inferior cerebullam bilaterally infarcts   History of pulmonary embolus (PE)    06-30-2012  extensive bilaterally   History of recurrent TIAs    History of syncope    hx multiple pre-syncope and syncopal episodes due to vasovagal, orthostatic hypotension, dehydration   History of TIAs    several since  2002   Hyperlipidemia    Hypertension    Mild atherosclerosis of carotid artery, bilateral    per last duplex 11-04-2014  bilateral ICA 1--39%   Neuropathy    fingers   OSA on CPAP    followed by dr dohmeier--  sev. osa w/ AHI 65.9   Prostate cancer ([place]) dx 2018   Renal insufficiency    S/P coronary artery stent placement 2010   stenting to mid LAD   Simple renal cyst    bilaterally   Stroke [facility])    Trigger finger of both hands 11-17-13   Type 2 diabetes mellitus ([place]) dx 1986   last one A1c 9.2 on 04-26-2016   Unsteady gait    . Hx prior CVA/TIAs;   Vertebral artery occlusion, left    chronic    Past Surgical History:  Procedure Laterality Date   ANTERIOR CERVICAL DECOMP/DISCECTOMY FUSION  2004   C3 -- C6 limited rom   CARDIAC CATHETERIZATION  06-10-2010   dr Irish Lack   wide patent LAD stent, mid lesion at the origin of the septal prior to the previous stent 40-50%/  normal LVF, ef 55%   CARDIOVASCULAR STRESS TEST  10-23-2012  dr Irish Lack   normal nuclear perfusion study w/ no ischemia/  normal LV function and wall motion ,  ef 65%   CARPAL TUNNEL RELEASE Bilateral    CATARACT EXTRACTION W/ INTRAOCULAR LENS  IMPLANT, BILATERAL     CHOLECYSTECTOMY N/A 11/02/2015   Procedure: LAPAROSCOPIC CHOLECYSTECTOMY WITH INTRAOPERATIVE CHOLANGIOGRAM;  Surgeon: Donnie Mesa, MD;  Location: [facility];  Service: General;  Laterality: N/A;   COLONOSCOPY     CORONARY ANGIOPLASTY WITH STENT PLACEMENT  02/2008   stenting to mid LAD   GOLD SEED IMPLANT N/A 11/15/2016   Procedure: GOLD SEED IMPLANT [place];  Surgeon: Ardis Hughs, MD;  Location: [facility];  Service: Urology;  Laterality: N/A;   IR ANGIO INTRA EXTRACRAN SEL COM CAROTID INNOMINATE BILAT MOD SED  06/13/2018   IR ANGIO VERTEBRAL SEL VERTEBRAL UNI R MOD SED  06/13/2018   IR US GUIDE VASC ACCESS RIGHT  06/13/2018   LEFT HEART CATH AND CORONARY ANGIOGRAPHY N/A 05/25/2017   Procedure: LEFT HEART CATH AND CORONARY  ANGIOGRAPHY;  Surgeon: Jettie Booze, MD;  Location: [facility] CV LAB;  Service: Cardiovascular;  Laterality: N/A;   LEFT HEART CATHETERIZATION WITH CORONARY ANGIOGRAM N/A 04/03/2013   Procedure: LEFT HEART CATHETERIZATION WITH CORONARY ANGIOGRAM;  Surgeon: Jettie Booze, MD;  Location: [facility] CATH LAB;  Service: Cardiovascular;  Laterality: N/A;  patent mLAD stent  w/ mild disease in remainder LAD and its branches;  mod. focal lesion midLCFx- FFR of lesion was negative for ischemia/  normal LVSF, ef 50%   lungs  2005   "fluid pumped off lungs"   NEUROPLASTY / TRANSPOSITION ULNAR NERVE AT ELBOW Right 2004   PROSTATE BIOPSY N/A 08/31/2016   Procedure: PROSTATE  BIOPSY TRANSRECTAL ULTRASONIC PROSTATE (TUBP);  Surgeon: Ardis Hughs, MD;  Location: [facility];  Service: Urology;  Laterality: N/A;   SPACE OAR INSTILLATION N/A 11/15/2016   Procedure: SPACE OAR INSTILLATION;  Surgeon: Ardis Hughs, MD;  Location[facility];  Service: Urology;  Laterality: N/A;   TRANSTHORACIC ECHOCARDIOGRAM  04/27/2016   severe focal basal LVH, ef 60-65%,  grade 2 diastoilc dysfunction/  mild AR, MR, and TR/  atrial septum lipomatous hypertrophy/  PASP [id number]   UMBILICAL HERNIA REPAIR      Vitals:   09/28/20 1239  BP: (!) 108/58  Pulse: 73  SpO2: 97%     Subjective Assessment - 09/28/20 1234     Subjective Patient reports passing out yesterday following mixing up his long acting and short acting insulin.  Was transported to [facility] and found to have low blood sugar levels.  Received treatment and was [facility] home.    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 unimited    How long can you stand comfortably? <15 minutes    How long can you walk comfortably? <15 minutes    Patient Stated Goals To relieve LLE pain, improve walking    Pain Onset More than a month ago                               [facility] Adult PT Treatment/Exercise - 09/28/20 0001       Knee/Hip Exercises: Sidelying  Clams 2x10 against manual resistance      Manual Therapy   Manual Therapy Soft tissue mobilization    Manual therapy comments performed soft tissue mobs to L piriformis in R sidelie 8 min duration, followed by prolonged hip flexor, SKTC and fig 4 stretch of 2 min duration ea.  STM to L quad to resolve trigger points in vastus intermedius and psoas    Soft tissue mobilization assisted patient with hip flexor stertch as well as joint capsule stretching vis pirifomis stertch position and fig 4 position, holding positions for 2 minute duration to elicit soft tissue "creep"                       PT Short Term Goals - 08/30/20 1249       PT SHORT TERM GOAL #1   Title Pt will demonstrate independence with updated and revised HEP    Time 4    Period Weeks    Status New    Target Date 09/16/20      PT SHORT TERM GOAL #2   Title Pt will demonstrate 10 degrees increase in L hamstring ROM and L hip flexor ROM    Baseline 60 deg hamstring, lacking 20 deg to neutral hip extension LLE Thomas test; 08/30/20 L hamstring ROM 90d in supine    Time 4    Period Weeks    Status New    Target Date 09/16/20      PT SHORT TERM GOAL #3   Title Pt will participate in stair negotiation assessment with LTG to be set    Baseline TBD    Time 4    Period Weeks    Status New    Target Date 09/16/20      PT SHORT TERM GOAL #4   Title Pt will decrease five time sit to stand to </= 15 seconds without UE support    Baseline 18.2 seconds    Time 4    Period Weeks     Status New    Target Date 09/16/20      PT SHORT TERM GOAL #5   Title Pt will increase gait velocity to >/= 2.8 ft/sec without AD and with decreased antalgic gait sequence    Baseline 2.46 ft/sec    Time 4    Period Weeks    Status New    Target Date 09/16/20               PT Long Term Goals - 08/24/20 1514       PT LONG TERM GOAL #1   Title Pt will be independent with final HEP and walking program at [facility]    Time 8    Period Weeks    Status New      PT LONG TERM GOAL #2   Title Pt will negotiate 16 stairs with one rail, alternating sequence MOD I    Time 8    Period Weeks    Status New      PT LONG TERM GOAL #3   Title Pt will demonstrate symmetrical hamstring length and hip extension (Thomas test) L to R side    Time 8    Period Weeks    Status New      PT LONG TERM GOAL #4   Title Pt will decrease five time sit to stand to </=13 seconds to indicate decreased falls risk    Time 8    Period Weeks  Status New      PT LONG TERM GOAL #5   Title Pt will increase gait velocity to >/= 3.0 ft/sec without AD    Time 8    Period Weeks    Status New                   Plan - 09/28/20 1313     Clinical Impression Statement Due to recent low BS levels, exercise and active tasks were kept to a minimum.  Todays session emphasized extensive manual therapy techniques focused L hip piriformis and hip flexor musculature with less irritation and trigger point prominence noted.    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    Examination-Activity Limitations  Bend;Locomotion Level;Stairs;Stand    Examination-Participation Restrictions Community Activity;Other   Fitness   Stability/Clinical Decision Making Evolving/Moderate complexity    Rehab Potential Good    PT Frequency 2x / week    PT Duration 8 weeks    PT Treatment/Interventions ADLs/Self Care Home Management;Aquatic Therapy;DME Instruction;Gait training;Stair training;Functional mobility training;Therapeutic activities;Therapeutic exercise;Balance training;Neuromuscular re-education;Patient/family education;Orthotic Fit/Training;Cryotherapy;Moist Heat;Manual techniques;Passive range of motion;Dry needling;Taping    PT Next Visit Plan Monitor pelvic alignment, manual STM to L priformis, L hip flexor stretching, STG progress, L quad/psoas TP treatment    PT Home Exercise Plan [id number]    Consulted and Agree with Plan of Care Patient             Patient will benefit from skilled therapeutic intervention in order to improve the following deficits and impairments:  Decreased activity tolerance, Decreased balance, Cardiopulmonary status limiting activity, Decreased endurance, Decreased range of motion, Decreased strength, Difficulty walking, Impaired sensation, Postural dysfunction, Pain  Visit Diagnosis: Muscle weakness (generalized)  Unsteadiness on feet  Other abnormalities of gait and mobility     Problem List Patient Active Problem List   Diagnosis Date Noted   Syncope 05/26/2020   Back pain 80/99/8338   Acute metabolic encephalopathy 25/05/3974   Lumbar spinal stenosis 08/15/2019   Nonspecific chest pain 08/11/2019   Shortness of breath    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 (HCC) 10/21/2018   Nocturnal hypoxemia 10/21/2018   Radiation therapy complication 73/41/9379   Primary prostate cancer ([place]) 07/31/2017   Anemia 07/31/2017   Vertigo due to cerebrovascular disease 07/31/2017   Poor compliance with CPAP treatment 07/31/2017   Absolute anemia  05/16/2017   Avitaminosis D 05/16/2017   Benign essential HTN 05/16/2017   Benign prostatic hypertrophy without urinary obstruction 05/16/2017   Clinical depression 05/16/2017   CN (constipation) 05/16/2017   Current drug use 05/16/2017   Diabetic neuropathy ([place]) 05/16/2017   Genital herpes 05/16/2017   Infarction of lung due to iatrogenic pulmonary embolism ([place]) 05/16/2017   Sciatica associated with disorder of lumbar spine 05/16/2017   Arteriosclerosis of coronary artery 05/16/2017   Artery disease, cerebral 05/16/2017   Apnea, sleep 05/16/2017   Arthralgia of hip or thigh 05/16/2017   Malignant neoplasm of prostate ([place]) 09/28/2016   Vascular dementia in remission ([place]) 09/28/2016   Remote history of stroke 09/28/2016   Acute kidney injury ([place]) 05/18/2016   Dehydration 05/18/2016   Near syncope 05/18/2016   Orthostatic hypotension 05/18/2016  CKD (chronic kidney disease), stage II 04/26/2016   UTI (urinary tract infection) 04/26/2016   Encounter for counseling on use of CPAP 11/18/2015   Chronic cholecystitis with calculus 11/02/2015   Stroke, 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[place]) 04/22/2015   TIA (transient ischemic attack) 11/03/2014   OSA on CPAP 05/18/2014   White matter disease 08/14/2013   Abnormal x-ray of temporomandibular joint 05/30/2013   Chronic infection of sinus 05/30/2013   Cough 05/30/2013   Fatigue 05/30/2013   Unsteady gait 05/19/2013   Combined fat and carbohydrate induced hyperlipemia 04/24/2013   Syncope 03/20/2013   Angina pectoris ([place]) 10/08/2012   Hypertension    Stroke ([place])    History of TIAs    Lumbago    Other and unspecified hyperlipidemia    Personal history of unspecified circulatory disease    Unwitnessed fall    Pain in joint, multiple sites    Degeneration of cervical intervertebral disc    Unspecified cardiovascular disease    History of pulmonary embolism: June 2014,  Takes Xarelto 07/01/2012    Class: History of   Hemiparesis ([place])  07/18/2011   Diabetes mellitus type 2 with complications ([place]) 75/10/2583   CAD in native artery 07/18/2011   History of recurrent TIAs 07/18/2011    Lanice Shirts, PT 09/28/2020, 2:37 PM  [facility] [address] [place], [place], [address] Phone: [phone]   Fax:  [phone]  Name: Steve Brown MRN: [id number] Date of Birth: 07-17-1940

## 2020-09-29 DIAGNOSIS — I10 Essential (primary) hypertension: Secondary | ICD-10-CM | POA: Diagnosis not present

## 2020-09-29 DIAGNOSIS — E782 Mixed hyperlipidemia: Secondary | ICD-10-CM | POA: Diagnosis not present

## 2020-10-01 ENCOUNTER — Other Ambulatory Visit: Payer: Self-pay

## 2020-10-01 ENCOUNTER — Ambulatory Visit: Payer: Medicare Other

## 2020-10-01 VITALS — BP 128/78 | HR 62

## 2020-10-01 DIAGNOSIS — R2681 Unsteadiness on feet: Secondary | ICD-10-CM

## 2020-10-01 DIAGNOSIS — R2689 Other abnormalities of gait and mobility: Secondary | ICD-10-CM

## 2020-10-01 DIAGNOSIS — M6281 Muscle weakness (generalized): Secondary | ICD-10-CM

## 2020-10-01 NOTE — Therapy (Signed)
[facility] [address] [place], [place], [address] Phone: [phone]   Fax:  [phone]  Physical Therapy Treatment/No Charge  Patient Details  Name: Steve Brown MRN: [id number] Date of Birth: Jan 15, 1940 Referring Provider (PT): Lawerance Cruel, MD   Encounter Date: 10/01/2020   PT End of Session - 10/01/20 1115     Visit Number 7    Number of Visits 17    Date for PT Re-Evaluation 10/15/20    Authorization Type Medicare A&B; 10th visit PN    Progress Note Due on Visit 10    PT Start Time 1100    PT Stop Time 1120    PT Time Calculation (min) 20 min    Equipment Utilized During Treatment Gait belt    Activity Tolerance Patient tolerated treatment well    Behavior During Therapy WFL for tasks assessed/performed             Past Medical History:  Diagnosis Date   Abnormal prostate biopsy    Anticoagulant long-term use    currently xarelto   BPH with elevated PSA    CKD (chronic kidney disease), stage II    Complication of anesthesia    limted neck rom limited use of left arm due to cva   Coronary artery disease    CARDIOLOGIST-  DR Irish Lack--  2010-- PCI w/ stenting midLAD   DDD (degenerative disc disease), lumbar    Degeneration of cervical intervertebral disc    Depression    Diabetes mellitus without complication (HCC)    Dyspnea on exertion    GERD (gastroesophageal reflux disease)    Hemiparesis due to cerebral infarction    History of cerebrovascular accident (CVA) with residual deficit 2002 and 2003--  hemiparisis both sides   per MRI  anterior left frontal lobe, left para midline pons, and inferior cerebullam bilaterally infarcts   History of pulmonary embolus (PE)    06-30-2012  extensive bilaterally   History of recurrent TIAs    History of syncope    hx multiple pre-syncope and syncopal episodes due to vasovagal, orthostatic hypotension, dehydration   History of TIAs    several  since 2002   Hyperlipidemia    Hypertension    Mild atherosclerosis of carotid artery, bilateral    per last duplex 11-04-2014  bilateral ICA 1--39%   Neuropathy    fingers   OSA on CPAP    followed by dr dohmeier--  sev. osa w/ AHI 65.9   Prostate cancer ([place]) dx 2018   Renal insufficiency    S/P coronary artery stent placement 2010   stenting to mid LAD   Simple renal cyst    bilaterally   Stroke [facility])    Trigger finger of both hands 11-17-13   Type 2 diabetes mellitus ([place]) dx 1986   last one A1c 9.2 on 04-26-2016   Unsteady gait    . Hx prior CVA/TIAs;   Vertebral artery occlusion, left    chronic    Past Surgical History:  Procedure Laterality Date   ANTERIOR CERVICAL DECOMP/DISCECTOMY FUSION  2004   C3 -- C6 limited rom   CARDIAC CATHETERIZATION  06-10-2010   dr Irish Lack   wide patent LAD stent, mid lesion at the origin of the septal prior to the previous stent 40-50%/  normal LVF, ef 55%   CARDIOVASCULAR STRESS TEST  10-23-2012  dr Irish Lack   normal nuclear perfusion study w/ no ischemia/  normal LV function and wall motion ,  ef 65%   CARPAL TUNNEL RELEASE Bilateral    CATARACT EXTRACTION W/ INTRAOCULAR LENS  IMPLANT, BILATERAL     CHOLECYSTECTOMY N/A 11/02/2015   Procedure: LAPAROSCOPIC CHOLECYSTECTOMY WITH INTRAOPERATIVE CHOLANGIOGRAM;  Surgeon: Donnie Mesa, MD;  Location: [facility];  Service: General;  Laterality: N/A;   COLONOSCOPY     CORONARY ANGIOPLASTY WITH STENT PLACEMENT  02/2008   stenting to mid LAD   GOLD SEED IMPLANT N/A 11/15/2016   Procedure: GOLD SEED IMPLANT [place];  Surgeon: Ardis Hughs, MD;  Location: [facility];  Service: Urology;  Laterality: N/A;   IR ANGIO INTRA EXTRACRAN SEL COM CAROTID INNOMINATE BILAT MOD SED  06/13/2018   IR ANGIO VERTEBRAL SEL VERTEBRAL UNI R MOD SED  06/13/2018   IR US GUIDE VASC ACCESS RIGHT  06/13/2018   LEFT HEART CATH AND CORONARY ANGIOGRAPHY N/A 05/25/2017   Procedure: LEFT HEART CATH AND  CORONARY ANGIOGRAPHY;  Surgeon: Jettie Booze, MD;  Location: [facility];  Service: Cardiovascular;  Laterality: N/A;   LEFT HEART CATHETERIZATION WITH CORONARY ANGIOGRAM N/A 04/03/2013   Procedure: LEFT HEART CATHETERIZATION WITH CORONARY ANGIOGRAM;  Surgeon: Jettie Booze, MD;  Location: [facility] C[facility] LAB;  Service: Cardiovascular;  Laterality: N/A;  patent mLAD stent  w/ mild disease in remainder LAD and its branches;  mod. focal lesion midLCFx- FFR of lesion was negative for ischemia/  normal LVSF, ef 50%   lungs  2005   "fluid pumped off lungs"   NEUROPLASTY / TRANSPOSITION ULNAR NERVE AT ELBOW Right 2004   PROSTATE BIOPSY N/A 08/31/2016   Procedure: PROSTATE  BIOPSY TRANSRECTAL ULTRASONIC PROSTATE (TUBP);  Surgeon: Ardis Hughs, MD;  Location: [facility];  Service: Urology;  Laterality: N/A;   SPACE OAR INSTILLATION N/A 11/15/2016   Procedure: SPACE OAR INSTILLATION;  Surgeon: Ardis Hughs, MD;  Location: [facility];  Service: Urology;  Laterality: N/A;   TRANSTHORACIC ECHOCARDIOGRAM  04/27/2016   severe focal basal LVH, ef 60-65%,  grade 2 diastoilc dysfunction/  mild AR, MR, and TR/  atrial septum lipomatous hypertrophy/  PASP [id number]   UMBILICAL HERNIA REPAIR      Vitals:   10/01/20 1114  BP: 128/78  Pulse: 62  SpO2: 98%     Subjective Assessment - 10/01/20 1107     Subjective Reports having to call 911 following an episode of L arm tingling and blurred vision.   EMS reported elevated BP but did not transport to hospital.    Pertinent History Long-term anticoagulant use, BPH with elevated PSA, CKD, CAD, DDD lumbar, degeneration of cervical intervertebral disc, depression, DM, DOE, hemiparesis due to CVA, multiple CVA, ACDF C3-C6, bilat carpal tunnel release, PE, h/o syncope, h/o TIA, HLD, HTN, neuropathy, OSA, prostate CA, Vertebral artery occlusion, neuroplasty/transposition ulnar nn at elbow    Limitations Walking;Standing     How long can you sit comfortably? unimited    How long can you stand comfortably? <15 minutes    How long can you walk comfortably? <15 minutes    Patient Stated Goals To relieve LLE pain, improve walking    Pain Onset More than a month ago                                          PT Short Term Goals - 08/30/20 1249       PT  SHORT TERM GOAL #1   Title Pt will demonstrate independence with updated and revised HEP    Time 4    Period Weeks    Status New    Target Date 09/16/20      PT SHORT TERM GOAL #2   Title Pt will demonstrate 10 degrees increase in L hamstring ROM and L hip flexor ROM    Baseline 60 deg hamstring, lacking 20 deg to neutral hip extension LLE Thomas test; 08/30/20 L hamstring ROM 90d in supine    Time 4    Period Weeks    Status New    Target Date 09/16/20      PT SHORT TERM GOAL #3   Title Pt will participate in stair negotiation assessment with LTG to be set    Baseline TBD    Time 4    Period Weeks    Status New    Target Date 09/16/20      PT SHORT TERM GOAL #4   Title Pt will decrease five time sit to stand to </= 15 seconds without UE support    Baseline 18.2 seconds    Time 4    Period Weeks    Status New    Target Date 09/16/20      PT SHORT TERM GOAL #5   Title Pt will increase gait velocity to >/= 2.8 ft/sec without AD and with decreased antalgic gait sequence    Baseline 2.46 ft/sec    Time 4    Period Weeks    Status New    Target Date 09/16/20               PT Long Term Goals - 08/24/20 1514       PT LONG TERM GOAL #1   Title Pt will be independent with final HEP and walking program at [facility]    Time 8    Period Weeks    Status New      PT LONG TERM GOAL #2   Title Pt will negotiate 16 stairs with one rail, alternating sequence MOD I    Time 8    Period Weeks    Status New      PT LONG TERM GOAL #3   Title Pt will demonstrate symmetrical hamstring length and hip extension (Thomas  test) L to R side    Time 8    Period Weeks    Status New      PT LONG TERM GOAL #4   Title Pt will decrease five time sit to stand to </=13 seconds to indicate decreased falls risk    Time 8    Period Weeks    Status New      PT LONG TERM GOAL #5   Title Pt will increase gait velocity to >/= 3.0 ft/sec without AD    Time 8    Period Weeks    Status New                   Plan - 10/01/20 1126     Clinical Impression Statement Patient arrived to clinic for session reporting he was not feeling well but denied HA, blurred vision or pain, VSs taken and found to be WNL.  Elected to withhold PT today due to recent episode on high BP yesterday and patient feeling he was not yet at baseline.    Personal Factors and Comorbidities Comorbidity 3+;Past/Current Experience    Comorbidities Long-term anticoagulant use, BPH with elevated  PSA, CKD, CAD, DDD lumbar, degeneration of cervical intervertebral disc, depression, DM, DOE, hemiparesis due to CVA, multiple CVA, ACDF C3-C6, bilat carpal tunnel release, PE, h/o syncope, h/o TIA, HLD, HTN, neuropathy, OSA, prostate CA, Vertebral artery occlusion, neuroplasty/transposition ulnar nn at elbow    Examination-Activity Limitations Bend;Locomotion Level;Stairs;Stand    Examination-Participation Restrictions Community Activity;Other   Fitness   Stability/Clinical Decision Making Evolving/Moderate complexity    Rehab Potential Good    PT Frequency 2x / week    PT Duration 8 weeks    PT Treatment/Interventions ADLs/Self Care Home Management;Aquatic Therapy;DME Instruction;Gait training;Stair training;Functional mobility training;Therapeutic activities;Therapeutic exercise;Balance training;Neuromuscular re-education;Patient/family education;Orthotic Fit/Training;Cryotherapy;Moist Heat;Manual techniques;Passive range of motion;Dry needling;Taping    PT Next Visit Plan Monitor pelvic alignment, manual STM to L priformis, L hip flexor stretching, STG  progress, L quad/psoas TP treatment    PT Home Exercise Plan [id number]    Consulted and Agree with Plan of Care Patient             Patient will benefit from skilled therapeutic intervention in order to improve the following deficits and impairments:  Decreased activity tolerance, Decreased balance, Cardiopulmonary status limiting activity, Decreased endurance, Decreased range of motion, Decreased strength, Difficulty walking, Impaired sensation, Postural dysfunction, Pain  Visit Diagnosis: Muscle weakness (generalized)  Unsteadiness on feet  Other abnormalities of gait and mobility     Problem List Patient Active Problem List   Diagnosis Date Noted   Syncope 05/26/2020   Back pain 50/93/2671   Acute metabolic encephalopathy 24/58/0998   Lumbar spinal stenosis 08/15/2019   Nonspecific chest pain 08/11/2019   Shortness of breath    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 (HCC) 10/21/2018   Nocturnal hypoxemia 10/21/2018   Radiation therapy complication 33/82/5053   Primary prostate cancer ([place]) 07/31/2017   Anemia 07/31/2017   Vertigo due to cerebrovascular disease 07/31/2017   Poor compliance with CPAP treatment 07/31/2017   Absolute anemia 05/16/2017   Avitaminosis D 05/16/2017   Benign essential HTN 05/16/2017   Benign prostatic hypertrophy without urinary obstruction 05/16/2017   Clinical depression 05/16/2017   CN (constipation) 05/16/2017   Current drug use 05/16/2017   Diabetic neuropathy ([place]) 05/16/2017   Genital herpes 05/16/2017   Infarction of lung due to iatrogenic pulmonary embolism ([place]) 05/16/2017   Sciatica associated with disorder of lumbar spine 05/16/2017   Arteriosclerosis of coronary artery 05/16/2017   Artery disease, cerebral 05/16/2017   Apnea, sleep 05/16/2017   Arthralgia of hip or thigh 05/16/2017   Malignant neoplasm of prostate ([place]) 09/28/2016   Vascular dementia in remission ([place]) 09/28/2016   Remote history of stroke  09/28/2016   Acute kidney injury ([employer]) 05/18/2016   Dehydration 05/18/2016   Near syncope 05/18/2016   Orthostatic hypotension 05/18/2016   CKD (chronic kidney disease), stage II 04/26/2016   UTI (urinary tract infection) 04/26/2016   Encounter for counseling on use of CPAP 11/18/2015   Chronic cholecystitis with calculus 11/02/2015   Stroke, 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[place]) 04/22/2015   TIA (transient ischemic attack) 11/03/2014   OSA on CPAP 05/18/2014   White matter disease 08/14/2013   Abnormal x-ray of temporomandibular joint 05/30/2013   Chronic infection of sinus 05/30/2013   Cough 05/30/2013   Fatigue 05/30/2013   Unsteady gait 05/19/2013   Combined fat and carbohydrate induced hyperlipemia 04/24/2013   Syncope 03/20/2013   Angina pectoris ([place]) 10/08/2012   Hypertension    Stroke ([place])    History of TIAs    Lumbago    Other and  unspecified hyperlipidemia    Personal history of unspecified circulatory disease    Unwitnessed fall    Pain in joint, multiple sites    Degeneration of cervical intervertebral disc    Unspecified cardiovascular disease    History of pulmonary embolism: June 2014,  Takes Xarelto 07/01/2012    Class: History of   Hemiparesis ([place]) 07/18/2011   Diabetes mellitus type 2 with complications ([place]) 14/44/5848   CAD in native artery 07/18/2011   History of recurrent TIAs 07/18/2011    Lanice Shirts, PT 10/01/2020, 11:33 AM  [facility] [address] [place], [place], [address] Phone: [phone]   Fax:  [phone]  Name: Steve Brown MRN: [id number] Date of Birth: Feb 07, 1940

## 2020-10-05 ENCOUNTER — Other Ambulatory Visit: Payer: Self-pay

## 2020-10-05 ENCOUNTER — Ambulatory Visit: Payer: Medicare Other

## 2020-10-05 VITALS — BP 116/64 | HR 76

## 2020-10-05 DIAGNOSIS — R2681 Unsteadiness on feet: Secondary | ICD-10-CM | POA: Diagnosis not present

## 2020-10-05 DIAGNOSIS — R42 Dizziness and giddiness: Secondary | ICD-10-CM | POA: Diagnosis not present

## 2020-10-05 DIAGNOSIS — M6281 Muscle weakness (generalized): Secondary | ICD-10-CM

## 2020-10-05 DIAGNOSIS — I10 Essential (primary) hypertension: Secondary | ICD-10-CM | POA: Diagnosis not present

## 2020-10-05 DIAGNOSIS — R2689 Other abnormalities of gait and mobility: Secondary | ICD-10-CM

## 2020-10-05 DIAGNOSIS — M5432 Sciatica, left side: Secondary | ICD-10-CM | POA: Diagnosis not present

## 2020-10-05 NOTE — Therapy (Signed)
[facility] [address] [place], [place], [address] Phone: [phone]   Fax:  [phone]  Physical Therapy Treatment  Patient Details  Name: Steve Brown MRN: [id number] Date of Birth: Aug 31, 1940 Referring Provider (PT): Lawerance Cruel, MD   Encounter Date: 10/05/2020   PT End of Session - 10/05/20 1326     Visit Number 8    Number of Visits 17    Date for PT Re-Evaluation 10/15/20    Authorization Type Medicare A&B; 10th visit PN    Authorization Time Period 8/8-10/8    Progress Note Due on Visit 10    PT Start Time 65    PT Stop Time 1315    PT Time Calculation (min) 45 min    Equipment Utilized During Treatment Gait belt    Activity Tolerance Patient tolerated treatment well    Behavior During Therapy WFL for tasks assessed/performed             Past Medical History:  Diagnosis Date   Abnormal prostate biopsy    Anticoagulant long-term use    currently xarelto   BPH with elevated PSA    CKD (chronic kidney disease), stage II    Complication of anesthesia    limted neck rom limited use of left arm due to cva   Coronary artery disease    CARDIOLOGIST-  DR Irish Lack--  2010-- PCI w/ stenting midLAD   DDD (degenerative disc disease), lumbar    Degeneration of cervical intervertebral disc    Depression    Diabetes mellitus without complication (HCC)    Dyspnea on exertion    GERD (gastroesophageal reflux disease)    Hemiparesis due to cerebral infarction    History of cerebrovascular accident (CVA) with residual deficit 2002 and 2003--  hemiparisis both sides   per MRI  anterior left frontal lobe, left para midline pons, and inferior cerebullam bilaterally infarcts   History of pulmonary embolus (PE)    06-30-2012  extensive bilaterally   History of recurrent TIAs    History of syncope    hx multiple pre-syncope and syncopal episodes due to vasovagal, orthostatic hypotension, dehydration    History of TIAs    several since 2002   Hyperlipidemia    Hypertension    Mild atherosclerosis of carotid artery, bilateral    per last duplex 11-04-2014  bilateral ICA 1--39%   Neuropathy    fingers   OSA on CPAP    followed by dr dohmeier--  sev. osa w/ AHI 65.9   Prostate cancer ([place]) dx 2018   Renal insufficiency    S/P coronary artery stent placement 2010   stenting to mid LAD   Simple renal cyst    bilaterally   Stroke [facility])    Trigger finger of both hands 11-17-13   Type 2 diabetes mellitus ([place]) dx 1986   last one A1c 9.2 on 04-26-2016   Unsteady gait    . Hx prior CVA/TIAs;   Vertebral artery occlusion, left    chronic    Past Surgical History:  Procedure Laterality Date   ANTERIOR CERVICAL DECOMP/DISCECTOMY FUSION  2004   C3 -- C6 limited rom   CARDIAC CATHETERIZATION  06-10-2010   dr Irish Lack   wide patent LAD stent, mid lesion at the origin of the septal prior to the previous stent 40-50%/  normal LVF, ef 55%   CARDIOVASCULAR STRESS TEST  10-23-2012  dr Irish Lack   normal nuclear perfusion study w/ no ischemia/  normal LV function and wall motion , ef 65%   CARPAL TUNNEL RELEASE Bilateral    CATARACT EXTRACTION W/ INTRAOCULAR LENS  IMPLANT, BILATERAL     CHOLECYSTECTOMY N/A 11/02/2015   Procedure: LAPAROSCOPIC CHOLECYSTECTOMY WITH INTRAOPERATIVE CHOLANGIOGRAM;  Surgeon: Donnie Mesa, MD;  Location: [facility];  Service: General;  Laterality: N/A;   COLONOSCOPY     CORONARY ANGIOPLASTY WITH STENT PLACEMENT  02/2008   stenting to mid LAD   GOLD SEED IMPLANT N/A 11/15/2016   Procedure: GOLD SEED IMPLANT [place];  Surgeon: Ardis Hughs, MD;  Location: [facility];  Service: Urology;  Laterality: N/A;   IR ANGIO INTRA EXTRACRAN SEL COM CAROTID INNOMINATE BILAT MOD SED  06/13/2018   IR ANGIO VERTEBRAL SEL VERTEBRAL UNI R MOD SED  06/13/2018   IR US GUIDE VASC ACCESS RIGHT  06/13/2018   LEFT HEART CATH AND CORONARY ANGIOGRAPHY N/A 05/25/2017    Procedure: LEFT HEART CATH AND CORONARY ANGIOGRAPHY;  Surgeon: Jettie Booze, MD;  Location: [facility];  Service: Cardiovascular;  Laterality: N/A;   LEFT HEART CATHETERIZATION WITH CORONARY ANGIOGRAM N/A 04/03/2013   Procedure: LEFT HEART CATHETERIZATION WITH CORONARY ANGIOGRAM;  Surgeon: Jettie Booze, MD;  Location: [facility] CATH LAB;  Service: Cardiovascular;  Laterality: N/A;  patent mLAD stent  w/ mild disease in remainder LAD and its branches;  mod. focal lesion midLCFx- FFR of lesion was negative for ischemia/  normal LVSF, ef 50%   lungs  2005   "fluid pumped off lungs"   NEUROPLASTY / TRANSPOSITION ULNAR NERVE AT ELBOW Right 2004   PROSTATE BIOPSY N/A 08/31/2016   Procedure: PROSTATE  BIOPSY TRANSRECTAL ULTRASONIC PROSTATE (TUBP);  Surgeon: Ardis Hughs, MD;  Location: [facility];  Service: Urology;  Laterality: N/A;   SPACE OAR INSTILLATION N/A 11/15/2016   Procedure: SPACE OAR INSTILLATION;  Surgeon: Ardis Hughs, MD;  Location: [facility];  Service: Urology;  Laterality: N/A;   TRANSTHORACIC ECHOCARDIOGRAM  04/27/2016   severe focal basal LVH, ef 60-65%,  grade 2 diastoilc dysfunction/  mild AR, MR, and TR/  atrial septum lipomatous hypertrophy/  PASP 56CLEX   UMBILICAL HERNIA REPAIR      Vitals:   10/05/20 1237 10/05/20 1312  BP: 118/70 116/64  Pulse: 76   SpO2: 97%      Subjective Assessment - 10/05/20 1322     Subjective Doing better overall, no issues with cardiac function or overall mobility.  Reporting minimal low back pain and Lthigh symptoms over the past few weeks    Pertinent History Long-term anticoagulant use, BPH with elevated PSA, CKD, CAD, DDD lumbar, degeneration of cervical intervertebral disc, depression, DM, DOE, hemiparesis due to CVA, multiple CVA, ACDF C3-C6, bilat carpal tunnel release, PE, h/o syncope, h/o TIA, HLD, HTN, neuropathy, OSA, prostate CA, Vertebral artery occlusion,  neuroplasty/transposition ulnar nn at elbow    Limitations Walking;Standing    How long can you sit comfortably? unimited    How long can you stand comfortably? <15 minutes    How long can you walk comfortably? <15 minutes    Patient Stated Goals To relieve LLE pain, improve walking    Pain Onset More than a month ago                               [facility] Adult PT Treatment/Exercise - 10/05/20 0001       Transfers  Transfers Sit to Stand;Stand to Sit    Sit to Stand 6: Modified independent (Device/Increase time)    Five time sit to stand comments  10.9s    Stand to Sit 6: Modified independent (Device/Increase time)      Ambulation/Gait   Ambulation/Gait Yes    Ambulation/Gait Assistance 6: Modified independent (Device/Increase time)    Ambulation Distance (Feet) 230 Feet    Assistive device None    Gait Pattern Decreased step length - left    Ambulation Surface Level;Indoor    Gait velocity 2.49 ft/s 0.76 m/s      Lumbar Exercises: Supine   Other Supine Lumbar Exercises Manually resisted hip hiking 15x      Lumbar Exercises: Sidelying   Clam Left    Clam Limitations 30x against manual resistance    Hip Abduction Limitations 30x with PT maintaining sidelie poisition and slight hip extension to isolate gluteus medius      Knee/Hip Exercises: Stretches   Other Knee/Hip Stretches L hip flexor stretch, 2 min hold with tactile assist from PT      Manual Therapy   Manual therapy comments No tenderness/TP detcted in L piriformis region                       PT Short Term Goals - 10/05/20 1259       PT SHORT TERM GOAL #1   Title Pt will demonstrate independence with updated and revised HEP    Baseline Independent in current HEP    Time 4    Period Weeks    Status Achieved    Target Date 09/16/20      PT SHORT TERM GOAL #2   Title Pt will demonstrate 10 degrees increase in L hamstring ROM and L hip flexor ROM    Baseline 60 deg hamstring,  lacking 20 deg to neutral hip extension LLE Thomas test; 08/30/20 L hamstring ROM 90d in supine; 10/05/20 90d in supine    Time 4    Period Weeks    Status Achieved    Target Date 09/16/20      PT SHORT TERM GOAL #3   Title Pt will participate in stair negotiation assessment with LTG to be set    Baseline TBD; 10/05/20 Able to negotiate a full flight of steps w/o need of rails with step through pattern    Time 4    Period Weeks    Status Achieved    Target Date 09/16/20      PT SHORT TERM GOAL #4   Title Pt will decrease five time sit to stand to </= 15 seconds without UE support    Baseline 18.2 seconds; 10/05/20 10.9s    Time 4    Period Weeks    Status Achieved    Target Date 09/16/20      PT SHORT TERM GOAL #5   Title Pt will increase gait velocity to >/= 2.8 ft/sec without AD and with decreased antalgic gait sequence    Baseline 2.46 ft/sec; 10/05/20 0.76 m/s or 2.49 ft/s    Time 4    Period Weeks    Status On-going    Target Date 09/16/20               PT Long Term Goals - 08/24/20 1514       PT LONG TERM GOAL #1   Title Pt will be independent with final HEP and walking program at [facility]    Time  8    Period Weeks    Status New      PT LONG TERM GOAL #2   Title Pt will negotiate 16 stairs with one rail, alternating sequence MOD I    Time 8    Period Weeks    Status New      PT LONG TERM GOAL #3   Title Pt will demonstrate symmetrical hamstring length and hip extension (Thomas test) L to R side    Time 8    Period Weeks    Status New      PT LONG TERM GOAL #4   Title Pt will decrease five time sit to stand to </=13 seconds to indicate decreased falls risk    Time 8    Period Weeks    Status New      PT LONG TERM GOAL #5   Title Pt will increase gait velocity to >/= 3.0 ft/sec without AD    Time 8    Period Weeks    Status New                   Plan - 10/05/20 1327     Clinical Impression Statement BP taken pre and post treatment,  findings WNL and asymptomatic.  No piriformis irritation eleicited.  Continued abduction and gluteus medius strengthening with PT TCs to aintain proper positions to isolate muscle groups.  Hip flexor mobility improving as minimal symptoms noted with Marcello Moores testing.  STGs assess and met.  Gait training with insructions to increase L step length.    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    Examination-Activity Limitations Bend;Locomotion Level;Stairs;Stand    Examination-Participation Restrictions Community Activity;Other   Fitness   Stability/Clinical Decision Making Evolving/Moderate complexity    Rehab Potential Good    PT Frequency 2x / week    PT Duration 8 weeks    PT Treatment/Interventions ADLs/Self Care Home Management;Aquatic Therapy;DME Instruction;Gait training;Stair training;Functional mobility training;Therapeutic activities;Therapeutic exercise;Balance training;Neuromuscular re-education;Patient/family education;Orthotic Fit/Training;Cryotherapy;Moist Heat;Manual techniques;Passive range of motion;Dry needling;Taping    PT Next Visit Plan Monitor pelvic alignment and L priformis, L hip flexor stretching, L quad/psoas TP treatment, normalize gait pattern, QL strengthening    PT Home Exercise Plan [id number]    Consulted and Agree with Plan of Care Patient             Patient will benefit from skilled therapeutic intervention in order to improve the following deficits and impairments:  Decreased activity tolerance, Decreased balance, Cardiopulmonary status limiting activity, Decreased  endurance, Decreased range of motion, Decreased strength, Difficulty walking, Impaired sensation, Postural dysfunction, Pain  Visit Diagnosis: Muscle weakness (generalized)  Unsteadiness on feet  Other abnormalities of gait and mobility     Problem List Patient Active Problem List   Diagnosis Date Noted   Syncope 05/26/2020   Back pain 39/03/90   Acute metabolic encephalopathy 33/00/7622   Lumbar spinal stenosis 08/15/2019   Nonspecific chest pain 08/11/2019   Shortness of breath    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 ([place]) 10/21/2018   Nocturnal hypoxemia 10/21/2018   Radiation therapy complication 63/33/5456   Primary prostate cancer ([place]) 07/31/2017   Anemia 07/31/2017  Vertigo due to cerebrovascular disease 07/31/2017   Poor compliance with CPAP treatment 07/31/2017   Absolute anemia 05/16/2017   Avitaminosis D 05/16/2017   Benign essential HTN 05/16/2017   Benign prostatic hypertrophy without urinary obstruction 05/16/2017   Clinical depression 05/16/2017   CN (constipation) 05/16/2017   Current drug use 05/16/2017   Diabetic neuropathy ([place]) 05/16/2017   Genital herpes 05/16/2017   Infarction of lung due to iatrogenic pulmonary embolism ([place]) 05/16/2017   Sciatica associated with disorder of lumbar spine 05/16/2017   Arteriosclerosis of coronary artery 05/16/2017   Artery disease, cerebral 05/16/2017   Apnea, sleep 05/16/2017   Arthralgia of hip or thigh 05/16/2017   Malignant neoplasm of prostate ([place]) 09/28/2016   Vascular dementia in remission ([place]) 09/28/2016   Remote history of stroke 09/28/2016   Acute kidney injury ([place]) 05/18/2016   Dehydration 05/18/2016   Near syncope 05/18/2016   Orthostatic hypotension 05/18/2016   CKD (chronic kidney disease), stage II 04/26/2016   UTI (urinary tract infection) 04/26/2016   Encounter for counseling on use of CPAP 11/18/2015   Chronic cholecystitis with calculus 11/02/2015   Stroke,  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[place]) 04/22/2015   TIA (transient ischemic attack) 11/03/2014   OSA on CPAP 05/18/2014   White matter disease 08/14/2013   Abnormal x-ray of temporomandibular joint 05/30/2013   Chronic infection of sinus 05/30/2013   Cough 05/30/2013   Fatigue 05/30/2013   Unsteady gait 05/19/2013   Combined fat and carbohydrate induced hyperlipemia 04/24/2013   Syncope 03/20/2013   Angina pectoris ([place]) 10/08/2012   Hypertension    Stroke ([place])    History of TIAs    Lumbago    Other and unspecified hyperlipidemia    Personal history of unspecified circulatory disease    Unwitnessed fall    Pain in joint, multiple sites    Degeneration of cervical intervertebral disc    Unspecified cardiovascular disease    History of pulmonary embolism: June 2014,  Takes Xarelto 07/01/2012    Class: History of   Hemiparesis ([place]) 07/18/2011   Diabetes mellitus type 2 with complications ([place]) 25/36/6440   CAD in native artery 07/18/2011   History of recurrent TIAs 07/18/2011    Lanice Shirts, PT 10/05/2020, 1:36 PM  [facility] [address] [place], [place], [address] Phone: [phone]   Fax:  [phone]  Name: Steve Brown MRN: [id number] Date of Birth: [birth year]

## 2020-10-06 DIAGNOSIS — F329 Major depressive disorder, single episode, unspecified: Secondary | ICD-10-CM | POA: Diagnosis not present

## 2020-10-06 DIAGNOSIS — C61 Malignant neoplasm of prostate: Secondary | ICD-10-CM | POA: Diagnosis not present

## 2020-10-06 DIAGNOSIS — N400 Enlarged prostate without lower urinary tract symptoms: Secondary | ICD-10-CM | POA: Diagnosis not present

## 2020-10-06 DIAGNOSIS — N183 Chronic kidney disease, stage 3 unspecified: Secondary | ICD-10-CM | POA: Diagnosis not present

## 2020-10-06 DIAGNOSIS — I10 Essential (primary) hypertension: Secondary | ICD-10-CM | POA: Diagnosis not present

## 2020-10-06 DIAGNOSIS — E782 Mixed hyperlipidemia: Secondary | ICD-10-CM | POA: Diagnosis not present

## 2020-10-06 DIAGNOSIS — I208 Other forms of angina pectoris: Secondary | ICD-10-CM | POA: Diagnosis not present

## 2020-10-06 DIAGNOSIS — K219 Gastro-esophageal reflux disease without esophagitis: Secondary | ICD-10-CM | POA: Diagnosis not present

## 2020-10-06 DIAGNOSIS — E1140 Type 2 diabetes mellitus with diabetic neuropathy, unspecified: Secondary | ICD-10-CM | POA: Diagnosis not present

## 2020-10-06 DIAGNOSIS — I2510 Atherosclerotic heart disease of native coronary artery without angina pectoris: Secondary | ICD-10-CM | POA: Diagnosis not present

## 2020-10-06 DIAGNOSIS — D649 Anemia, unspecified: Secondary | ICD-10-CM | POA: Diagnosis not present

## 2020-10-06 DIAGNOSIS — I209 Angina pectoris, unspecified: Secondary | ICD-10-CM | POA: Diagnosis not present

## 2020-10-08 ENCOUNTER — Ambulatory Visit: Payer: Medicare Other

## 2020-10-12 ENCOUNTER — Ambulatory Visit: Payer: Medicare Other

## 2020-10-14 ENCOUNTER — Ambulatory Visit: Payer: Medicare Other

## 2020-10-18 DIAGNOSIS — E1142 Type 2 diabetes mellitus with diabetic polyneuropathy: Secondary | ICD-10-CM | POA: Diagnosis not present

## 2020-10-19 ENCOUNTER — Ambulatory Visit: Payer: Medicare Other | Attending: Family Medicine

## 2020-10-19 DIAGNOSIS — M5432 Sciatica, left side: Secondary | ICD-10-CM

## 2020-10-19 DIAGNOSIS — R2689 Other abnormalities of gait and mobility: Secondary | ICD-10-CM | POA: Diagnosis not present

## 2020-10-19 DIAGNOSIS — Z8546 Personal history of malignant neoplasm of prostate: Secondary | ICD-10-CM | POA: Diagnosis not present

## 2020-10-19 DIAGNOSIS — M6281 Muscle weakness (generalized): Secondary | ICD-10-CM

## 2020-10-19 DIAGNOSIS — R2681 Unsteadiness on feet: Secondary | ICD-10-CM | POA: Diagnosis not present

## 2020-10-19 NOTE — Therapy (Signed)
[facility] [address] [place], [place], [address] Phone: [phone]   Fax:  [phone]  Physical Therapy Treatment  Patient Details  Name: Steve Brown MRN: [id number] Date of Birth: 1940-09-19 Referring Provider (PT): Daisy Floro, MD   Encounter Date: 10/19/2020   PT End of Session - 10/19/20 1315     Visit Number 9    Number of Visits 17    Date for PT Re-Evaluation 10/15/20    Authorization Type Medicare A&B; 10th visit PN    Authorization Time Period 8/8-10/8    Progress Note Due on Visit 10    PT Start Time 1230    PT Stop Time 1315    PT Time Calculation (min) 45 min    Equipment Utilized During Treatment Gait belt    Activity Tolerance Patient tolerated treatment well    Behavior During Therapy WFL for tasks assessed/performed             Past Medical History:  Diagnosis Date   Abnormal prostate biopsy    Anticoagulant long-term use    currently xarelto   BPH with elevated PSA    CKD (chronic kidney disease), stage II    Complication of anesthesia    limted neck rom limited use of left arm due to cva   Coronary artery disease    CARDIOLOGIST-  DR Eldridge Dace--  2010-- PCI w/ stenting midLAD   DDD (degenerative disc disease), lumbar    Degeneration of cervical intervertebral disc    Depression    Diabetes mellitus without complication (HCC)    Dyspnea on exertion    GERD (gastroesophageal reflux disease)    Hemiparesis due to cerebral infarction    History of cerebrovascular accident (CVA) with residual deficit 2002 and 2003--  hemiparisis both sides   per MRI  anterior left frontal lobe, left para midline pons, and inferior cerebullam bilaterally infarcts   History of pulmonary embolus (PE)    06-30-2012  extensive bilaterally   History of recurrent TIAs    History of syncope    hx multiple pre-syncope and syncopal episodes due to vasovagal, orthostatic hypotension, dehydration    History of TIAs    several since 2002   Hyperlipidemia    Hypertension    Mild atherosclerosis of carotid artery, bilateral    per last duplex 11-04-2014  bilateral ICA 1--39%   Neuropathy    fingers   OSA on CPAP    followed by dr dohmeier--  sev. osa w/ AHI 65.9   Prostate cancer (HCC) dx 2018   Renal insufficiency    S/P coronary artery stent placement 2010   stenting to mid LAD   Simple renal cyst    bilaterally   Stroke [facility])    Trigger finger of both hands 11-17-13   Type 2 diabetes mellitus (HCC) dx 1986   last one A1c 9.2 on 04-26-2016   Unsteady gait    . Hx prior CVA/TIAs;   Vertebral artery occlusion, left    chronic    Past Surgical History:  Procedure Laterality Date   ANTERIOR CERVICAL DECOMP/DISCECTOMY FUSION  2004   C3 -- C6 limited rom   CARDIAC CATHETERIZATION  06-10-2010   dr Eldridge Dace   wide patent LAD stent, mid lesion at the origin of the septal prior to the previous stent 40-50%/  normal LVF, ef 55%   CARDIOVASCULAR STRESS TEST  10-23-2012  dr Eldridge Dace   normal nuclear perfusion study w/ no ischemia/  normal LV function and wall motion , ef 65%   CARPAL TUNNEL RELEASE Bilateral    CATARACT EXTRACTION W/ INTRAOCULAR LENS  IMPLANT, BILATERAL     CHOLECYSTECTOMY N/A 11/02/2015   Procedure: LAPAROSCOPIC CHOLECYSTECTOMY WITH INTRAOPERATIVE CHOLANGIOGRAM;  Surgeon: Donnie Mesa, MD;  Location: [facility];  Service: General;  Laterality: N/A;   COLONOSCOPY     CORONARY ANGIOPLASTY WITH STENT PLACEMENT  02/2008   stenting to mid LAD   GOLD SEED IMPLANT N/A 11/15/2016   Procedure: GOLD SEED IMPLANT [place];  Surgeon: Ardis Hughs, MD;  Location: [facility];  Service: Urology;  Laterality: N/A;   IR ANGIO INTRA EXTRACRAN SEL COM CAROTID INNOMINATE BILAT MOD SED  06/13/2018   IR ANGIO VERTEBRAL SEL VERTEBRAL UNI R MOD SED  06/13/2018   IR US GUIDE VASC ACCESS RIGHT  06/13/2018   LEFT HEART CATH AND CORONARY ANGIOGRAPHY N/A 05/25/2017    Procedure: LEFT HEART CATH AND CORONARY ANGIOGRAPHY;  Surgeon: Jettie Booze, MD;  Location: [facility];  Service: Cardiovascular;  Laterality: N/A;   LEFT HEART CATHETERIZATION WITH CORONARY ANGIOGRAM N/A 04/03/2013   Procedure: LEFT HEART CATHETERIZATION WITH CORONARY ANGIOGRAM;  Surgeon: Jettie Booze, MD;  Location: [facility] CATH LAB;  Service: Cardiovascular;  Laterality: N/A;  patent mLAD stent  w/ mild disease in remainder LAD and its branches;  mod. focal lesion midLCFx- FFR of lesion was negative for ischemia/  normal LVSF, ef 50%   lungs  2005   "fluid pumped off lungs"   NEUROPLASTY / TRANSPOSITION ULNAR NERVE AT ELBOW Right 2004   PROSTATE BIOPSY N/A 08/31/2016   Procedure: PROSTATE  BIOPSY TRANSRECTAL ULTRASONIC PROSTATE (TUBP);  Surgeon: Ardis Hughs, MD;  Location: [facility];  Service: Urology;  Laterality: N/A;   SPACE OAR INSTILLATION N/A 11/15/2016   Procedure: SPACE OAR INSTILLATION;  Surgeon: Ardis Hughs, MD;  Location: [facility];  Service: Urology;  Laterality: N/A;   TRANSTHORACIC ECHOCARDIOGRAM  04/27/2016   severe focal basal LVH, ef 60-65%,  grade 2 diastoilc dysfunction/  mild AR, MR, and TR/  atrial septum lipomatous hypertrophy/  PASP [id number]   UMBILICAL HERNIA REPAIR      There were no vitals filed for this visit.   Subjective Assessment - 10/19/20 1236     Subjective LLE pain smptoms have improved but not resolved, still not doing TP release on L quad with rolling pin as he cannot find a rolling pin    Pertinent History Long-term anticoagulant use, BPH with elevated PSA, CKD, CAD, DDD lumbar, degeneration of cervical intervertebral disc, depression, DM, DOE, hemiparesis due to CVA, multiple CVA, ACDF C3-C6, bilat carpal tunnel release, PE, h/o syncope, h/o TIA, HLD, HTN, neuropathy, OSA, prostate CA, Vertebral artery occlusion, neuroplasty/transposition ulnar nn at elbow    Limitations Walking;Standing     How long can you sit comfortably? unimited    How long can you stand comfortably? <15 minutes    How long can you walk comfortably? <15 minutes    Patient Stated Goals To relieve LLE pain, improve walking    Pain Onset More than a month ago                               [facility] Adult PT Treatment/Exercise - 10/19/20 0001       Transfers   Transfers Sit to Stand;Stand to Sit    Sit to  Stand 6: Modified independent (Device/Increase time)    Five time sit to stand comments  12.6s    Stand to Sit 6: Modified independent (Device/Increase time)      Ambulation/Gait   Ambulation/Gait Yes    Ambulation/Gait Assistance 6: Modified independent (Device/Increase time)    Ambulation Distance (Feet) 230 Feet    Assistive device None    Gait Pattern Decreased step length - left    Ambulation Surface Level;Indoor    Gait velocity 0.68 m/s      Lumbar Exercises: Stretches   Figure 4 Stretch 1 rep;Supine;Without overpressure;Limitations    Figure 4 Stretch Limitations 2 min static hold      Lumbar Exercises: Sidelying   Clam Left    Clam Limitations 30x against manual resistance    Hip Abduction Limitations 30x with PT maintaining sidelie poisition and slight hip extension to isolate gluteus medius      Manual Therapy   Manual therapy comments No tenderness/TP detcted in L piriformis region                       PT Short Term Goals - 10/05/20 1259       PT SHORT TERM GOAL #1   Title Pt will demonstrate independence with updated and revised HEP    Baseline Independent in current HEP    Time 4    Period Weeks    Status Achieved    Target Date 09/16/20      PT SHORT TERM GOAL #2   Title Pt will demonstrate 10 degrees increase in L hamstring ROM and L hip flexor ROM    Baseline 60 deg hamstring, lacking 20 deg to neutral hip extension LLE Thomas test; 08/30/20 L hamstring ROM 90d in supine; 10/05/20 90d in supine    Time 4    Period Weeks    Status  Achieved    Target Date 09/16/20      PT SHORT TERM GOAL #3   Title Pt will participate in stair negotiation assessment with LTG to be set    Baseline TBD; 10/05/20 Able to negotiate a full flight of steps w/o need of rails with step through pattern    Time 4    Period Weeks    Status Achieved    Target Date 09/16/20      PT SHORT TERM GOAL #4   Title Pt will decrease five time sit to stand to </= 15 seconds without UE support    Baseline 18.2 seconds; 10/05/20 10.9s    Time 4    Period Weeks    Status Achieved    Target Date 09/16/20      PT SHORT TERM GOAL #5   Title Pt will increase gait velocity to >/= 2.8 ft/sec without AD and with decreased antalgic gait sequence    Baseline 2.46 ft/sec; 10/05/20 0.76 m/s or 2.49 ft/s    Time 4    Period Weeks    Status On-going    Target Date 09/16/20               PT Long Term Goals - 10/19/20 1240       PT LONG TERM GOAL #1   Title Pt will be independent with final HEP and walking program at [place] Patient has returnes to [facility] walking and performing chair yoga    Time 4    Period Weeks    Status Achieved    Target Date  11/23/20      PT LONG TERM GOAL #2   Title Pt will negotiate 16 stairs with one rail, alternating sequence MOD I    Baseline 10/19/20 Patient able to negotiate 16 steps with step throug pattern with B rails    Time 4    Period Weeks    Status Partially Met    Target Date 11/23/20      PT LONG TERM GOAL #3   Title Pt will demonstrate symmetrical hamstring length and hip extension (Thomas test) L to R side    Baseline 10/19/20 B hamstring flexibility esentiall equal, 90d B SLR, Thomas test ROM also equal upon observation    Time 8    Period Weeks    Status Achieved      PT LONG TERM GOAL #4   Title Pt will decrease five time sit to stand to </=13 seconds to indicate decreased falls risk    Baseline 10/19/20 5x STS in 12.6s    Time 4    Period Weeks    Status Achieved    Target Date  11/23/20      PT LONG TERM GOAL #5   Title Pt will increase gait velocity to >/= 3.0 ft/sec without AD    Baseline 10/19/20 gait velocity 0.68 m/s or 2.23 ft/s    Time 4    Period Weeks    Status Partially Met    Target Date 11/23/20                   Plan - 10/19/20 1326     Clinical Impression Statement No issues to report with BP since last session.  todays session focused on goal progress, all STGs met, LTGs partially met regarding stair climbing, gait speed and HEP independence.  L hip mobility remains restricted and TP still noted in L quad with less point tenderness reported however.  Overall gait pattern hs improved as patient demos more L hip extension and greater L step length.  Patient would benefit from OPPT extension due to missed visits.    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    Examination-Activity Limitations Bend;Locomotion Level;Stairs;Stand    Examination-Participation Restrictions Community Activity;Other   Fitness   Stability/Clinical Decision Making Evolving/Moderate complexity    Rehab Potential Good    PT Frequency 2x / week    PT Duration 4 weeks    PT Treatment/Interventions ADLs/Self Care Home Management;Aquatic Therapy;DME Instruction;Gait training;Stair training;Functional mobility training;Therapeutic activities;Therapeutic exercise;Balance training;Neuromuscular re-education;Patient/family education;Orthotic Fit/Training;Cryotherapy;Moist Heat;Manual techniques;Passive range of motion;Dry needling;Taping    PT Next Visit Plan L hip flexor  stretching, L quad/psoas TP treatment, normalize gait pattern, QL strengthening    PT Home Exercise Plan [email or web address]    Consulted and Agree with Plan of Care Patient             Patient will benefit from skilled therapeutic intervention in order to improve the following deficits and impairments:  Decreased activity tolerance, Decreased balance, Cardiopulmonary status limiting activity, Decreased endurance, Decreased range of motion, Decreased strength, Difficulty walking, Impaired sensation, Postural dysfunction, Pain  Visit Diagnosis: Muscle weakness (generalized)  Unsteadiness on feet  Other abnormalities of gait and mobility  Sciatica, left side     Problem List Patient Active Problem List   Diagnosis Date Noted   Syncope 05/26/2020   Back pain 78/47/8412   Acute metabolic encephalopathy 82/08/1386   Lumbar spinal stenosis 08/15/2019   Nonspecific chest pain 08/11/2019   Shortness of breath    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 ([place]) 10/21/2018   Nocturnal hypoxemia 10/21/2018   Radiation therapy complication 71/95/9747   Primary prostate cancer ([place]) 07/31/2017   Anemia 07/31/2017   Vertigo due to cerebrovascular disease 07/31/2017   Poor compliance with CPAP treatment 07/31/2017   Absolute anemia 05/16/2017   Avitaminosis D 05/16/2017   Benign essential HTN 05/16/2017   Benign prostatic hypertrophy without urinary obstruction 05/16/2017   Clinical depression 05/16/2017   CN (constipation) 05/16/2017   Current drug use 05/16/2017   Diabetic neuropathy ([place]) 05/16/2017   Genital herpes 05/16/2017   Infarction of lung due to iatrogenic pulmonary embolism ([place]) 05/16/2017   Sciatica associated with disorder of lumbar spine 05/16/2017   Arteriosclerosis of coronary artery 05/16/2017   Artery disease, cerebral 05/16/2017   Apnea, sleep 05/16/2017   Arthralgia of hip or thigh 05/16/2017   Malignant neoplasm of prostate ([place]) 09/28/2016   Vascular  dementia in remission ([place]) 09/28/2016   Remote history of stroke 09/28/2016   Acute kidney injury ([place]) 05/18/2016   Dehydration 05/18/2016   Near syncope 05/18/2016   Orthostatic hypotension 05/18/2016   CKD (chronic kidney disease), stage II 04/26/2016   UTI (urinary tract infection) 04/26/2016   Encounter for counseling on use of CPAP 11/18/2015   Chronic cholecystitis with calculus 11/02/2015   Stroke, 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[place]) 04/22/2015   TIA (transient ischemic attack) 11/03/2014   OSA on CPAP 05/18/2014   White matter disease 08/14/2013   Abnormal x-ray of temporomandibular joint 05/30/2013   Chronic infection of sinus 05/30/2013   Cough 05/30/2013   Fatigue 05/30/2013   Unsteady gait 05/19/2013   Combined fat and carbohydrate induced hyperlipemia 04/24/2013   Syncope 03/20/2013   Angina pectoris ([place]) 10/08/2012   Hypertension    Stroke ([place])    History of TIAs    Lumbago    Other and unspecified hyperlipidemia    Personal history of unspecified circulatory disease    Unwitnessed fall    Pain in joint, multiple sites    Degeneration of cervical intervertebral disc    Unspecified cardiovascular disease    History of pulmonary embolism: June 2014,  Takes Xarelto 07/01/2012    Class: History of   Hemiparesis ([place]) 07/18/2011   Diabetes mellitus type 2 with complications ([place]) 18/55/0158   CAD in native artery 07/18/2011   History of recurrent TIAs 07/18/2011    Lanice Shirts, PT 10/19/2020, 1:40 PM  [facility] [address] [place], [place], [address] Phone: [phone]   Fax:  [phone]  Name: Steve Brown MRN: [id number] Date of Birth: 07-09-1940

## 2020-10-19 NOTE — Patient Instructions (Signed)
Access Code: [email or web address] URL: [email or web address] Date: 10/19/2020 Prepared by: Sharlynn Oliphant  Exercises Clamshell - 2 x daily - 7 x weekly - 2 sets - 15 reps Squat - 2 x daily - 7 x weekly - 2 sets - 10 reps Sidelying Hip Abduction - 2 x daily - 7 x weekly - 2 sets - 15 reps

## 2020-10-20 DIAGNOSIS — H02403 Unspecified ptosis of bilateral eyelids: Secondary | ICD-10-CM | POA: Diagnosis not present

## 2020-10-20 DIAGNOSIS — H52203 Unspecified astigmatism, bilateral: Secondary | ICD-10-CM | POA: Diagnosis not present

## 2020-10-20 DIAGNOSIS — E113292 Type 2 diabetes mellitus with mild nonproliferative diabetic retinopathy without macular edema, left eye: Secondary | ICD-10-CM | POA: Diagnosis not present

## 2020-10-22 ENCOUNTER — Ambulatory Visit: Payer: 59

## 2020-10-25 DIAGNOSIS — R351 Nocturia: Secondary | ICD-10-CM | POA: Diagnosis not present

## 2020-10-25 DIAGNOSIS — Z8546 Personal history of malignant neoplasm of prostate: Secondary | ICD-10-CM | POA: Diagnosis not present

## 2020-10-25 DIAGNOSIS — N5201 Erectile dysfunction due to arterial insufficiency: Secondary | ICD-10-CM | POA: Diagnosis not present

## 2020-10-26 DIAGNOSIS — H02834 Dermatochalasis of left upper eyelid: Secondary | ICD-10-CM | POA: Diagnosis not present

## 2020-10-26 DIAGNOSIS — H02831 Dermatochalasis of right upper eyelid: Secondary | ICD-10-CM | POA: Diagnosis not present

## 2020-10-26 DIAGNOSIS — H53483 Generalized contraction of visual field, bilateral: Secondary | ICD-10-CM | POA: Diagnosis not present

## 2020-10-26 DIAGNOSIS — H04123 Dry eye syndrome of bilateral lacrimal glands: Secondary | ICD-10-CM | POA: Diagnosis not present

## 2020-10-26 DIAGNOSIS — H02423 Myogenic ptosis of bilateral eyelids: Secondary | ICD-10-CM | POA: Diagnosis not present

## 2020-10-26 DIAGNOSIS — H57813 Brow ptosis, bilateral: Secondary | ICD-10-CM | POA: Diagnosis not present

## 2020-10-26 DIAGNOSIS — H02413 Mechanical ptosis of bilateral eyelids: Secondary | ICD-10-CM | POA: Diagnosis not present

## 2020-10-26 DIAGNOSIS — H0279 Other degenerative disorders of eyelid and periocular area: Secondary | ICD-10-CM | POA: Diagnosis not present

## 2020-10-28 ENCOUNTER — Ambulatory Visit: Payer: Medicare Other

## 2020-10-28 ENCOUNTER — Other Ambulatory Visit: Payer: Self-pay

## 2020-10-28 DIAGNOSIS — R2681 Unsteadiness on feet: Secondary | ICD-10-CM | POA: Diagnosis not present

## 2020-10-28 DIAGNOSIS — M5432 Sciatica, left side: Secondary | ICD-10-CM | POA: Diagnosis not present

## 2020-10-28 DIAGNOSIS — M6281 Muscle weakness (generalized): Secondary | ICD-10-CM | POA: Diagnosis not present

## 2020-10-28 DIAGNOSIS — R2689 Other abnormalities of gait and mobility: Secondary | ICD-10-CM

## 2020-10-28 NOTE — Therapy (Signed)
[facility] [address] [place], [place], [address] Phone: [phone]   Fax:  [phone]  Physical Therapy Treatment  Patient Details  Name: Steve Brown MRN: [id number] Date of Birth: Mar 16, 1940 Referring Provider (PT): Lawerance Cruel, MD   Encounter Date: 10/28/2020   PT End of Session - 10/28/20 1120     Visit Number 10    Number of Visits 17    Date for PT Re-Evaluation 10/15/20    Authorization Type Medicare A&B; 10th visit PN    Authorization Time Period 8/8-10/8    Progress Note Due on Visit 10    PT Start Time 1100    PT Stop Time 1145    PT Time Calculation (min) 45 min    Equipment Utilized During Treatment Gait belt    Activity Tolerance Patient tolerated treatment well    Behavior During Therapy WFL for tasks assessed/performed             Past Medical History:  Diagnosis Date   Abnormal prostate biopsy    Anticoagulant long-term use    currently xarelto   BPH with elevated PSA    CKD (chronic kidney disease), stage II    Complication of anesthesia    limted neck rom limited use of left arm due to cva   Coronary artery disease    CARDIOLOGIST-  DR Irish Lack--  2010-- PCI w/ stenting midLAD   DDD (degenerative disc disease), lumbar    Degeneration of cervical intervertebral disc    Depression    Diabetes mellitus without complication (HCC)    Dyspnea on exertion    GERD (gastroesophageal reflux disease)    Hemiparesis due to cerebral infarction    History of cerebrovascular accident (CVA) with residual deficit 2002 and 2003--  hemiparisis both sides   per MRI  anterior left frontal lobe, left para midline pons, and inferior cerebullam bilaterally infarcts   History of pulmonary embolus (PE)    06-30-2012  extensive bilaterally   History of recurrent TIAs    History of syncope    hx multiple pre-syncope and syncopal episodes due to vasovagal, orthostatic hypotension, dehydration    History of TIAs    several since 2002   Hyperlipidemia    Hypertension    Mild atherosclerosis of carotid artery, bilateral    per last duplex 11-04-2014  bilateral ICA 1--39%   Neuropathy    fingers   OSA on CPAP    followed by dr dohmeier--  sev. osa w/ AHI 65.9   Prostate cancer ([place]) dx 2018   Renal insufficiency    S/P coronary artery stent placement 2010   stenting to mid LAD   Simple renal cyst    bilaterally   Stroke [facility])    Trigger finger of both hands 11-17-13   Type 2 diabetes mellitus ([place]) dx 1986   last one A1c 9.2 on 04-26-2016   Unsteady gait    . Hx prior CVA/TIAs;   Vertebral artery occlusion, left    chronic    Past Surgical History:  Procedure Laterality Date   ANTERIOR CERVICAL DECOMP/DISCECTOMY FUSION  2004   C3 -- C6 limited rom   CARDIAC CATHETERIZATION  06-10-2010   dr Irish Lack   wide patent LAD stent, mid lesion at the origin of the septal prior to the previous stent 40-50%/  normal LVF, ef 55%   CARDIOVASCULAR STRESS TEST  10-23-2012  dr Irish Lack   normal nuclear perfusion study w/ no ischemia/  normal LV function and wall motion , ef 65%   CARPAL TUNNEL RELEASE Bilateral    CATARACT EXTRACTION W/ INTRAOCULAR LENS  IMPLANT, BILATERAL     CHOLECYSTECTOMY N/A 11/02/2015   Procedure: LAPAROSCOPIC CHOLECYSTECTOMY WITH INTRAOPERATIVE CHOLANGIOGRAM;  Surgeon: Donnie Mesa, MD;  Location: [facility];  Service: General;  Laterality: N/A;   COLONOSCOPY     CORONARY ANGIOPLASTY WITH STENT PLACEMENT  02/2008   stenting to mid LAD   GOLD SEED IMPLANT N/A 11/15/2016   Procedure: GOLD SEED IMPLANT [place];  Surgeon: Ardis Hughs, MD;  Location: [facility];  Service: Urology;  Laterality: N/A;   IR ANGIO INTRA EXTRACRAN SEL COM CAROTID INNOMINATE BILAT MOD SED  06/13/2018   IR ANGIO VERTEBRAL SEL VERTEBRAL UNI R MOD SED  06/13/2018   IR US GUIDE VASC ACCESS RIGHT  06/13/2018   LEFT HEART CATH AND CORONARY ANGIOGRAPHY N/A 05/25/2017    Procedure: LEFT HEART CATH AND CORONARY ANGIOGRAPHY;  Surgeon: Jettie Booze, MD;  Location: [facility];  Service: Cardiovascular;  Laterality: N/A;   LEFT HEART CATHETERIZATION WITH CORONARY ANGIOGRAM N/A 04/03/2013   Procedure: LEFT HEART CATHETERIZATION WITH CORONARY ANGIOGRAM;  Surgeon: Jettie Booze, MD;  Location: [facility] CATH LAB;  Service: Cardiovascular;  Laterality: N/A;  patent mLAD stent  w/ mild disease in remainder LAD and its branches;  mod. focal lesion midLCFx- FFR of lesion was negative for ischemia/  normal LVSF, ef 50%   lungs  2005   "fluid pumped off lungs"   NEUROPLASTY / TRANSPOSITION ULNAR NERVE AT ELBOW Right 2004   PROSTATE BIOPSY N/A 08/31/2016   Procedure: PROSTATE  BIOPSY TRANSRECTAL ULTRASONIC PROSTATE (TUBP);  Surgeon: Ardis Hughs, MD;  Location: [facility];  Service: Urology;  Laterality: N/A;   SPACE OAR INSTILLATION N/A 11/15/2016   Procedure: SPACE OAR INSTILLATION;  Surgeon: Ardis Hughs, MD;  Location: [facility];  Service: Urology;  Laterality: N/A;   TRANSTHORACIC ECHOCARDIOGRAM  04/27/2016   severe focal basal LVH, ef 60-65%,  grade 2 diastoilc dysfunction/  mild AR, MR, and TR/  atrial septum lipomatous hypertrophy/  PASP 50IBBC   UMBILICAL HERNIA REPAIR      There were no vitals filed for this visit.   Subjective Assessment - 10/28/20 1103     Subjective returns from a trip to [facility] and noted pain across midsection extending around to low back.    Pertinent History Long-term anticoagulant use, BPH with elevated PSA, CKD, CAD, DDD lumbar, degeneration of cervical intervertebral disc, depression, DM, DOE, hemiparesis due to CVA, multiple CVA, ACDF C3-C6, bilat carpal tunnel release, PE, h/o syncope, h/o TIA, HLD, HTN, neuropathy, OSA, prostate CA, Vertebral artery occlusion, neuroplasty/transposition ulnar nn at elbow    Limitations Walking;Standing    How long can you sit comfortably?  unimited    How long can you stand comfortably? <15 minutes    How long can you walk comfortably? <15 minutes    Patient Stated Goals To relieve LLE pain, improve walking    Pain Onset More than a month ago                               [facility] Adult PT Treatment/Exercise - 10/28/20 0001       Ambulation/Gait   Ambulation/Gait Yes    Ambulation/Gait Assistance 6: Modified independent (Device/Increase time)    Ambulation Distance (Feet) 230 Feet  Assistive device None    Gait Pattern Decreased step length - left    Ambulation Surface Level;Indoor    Gait velocity 0.71 m/s      Lumbar Exercises: Stretches   Hip Flexor Stretch Left;1 rep;Limitations    Hip Flexor Stretch Limitations 2 min hold    Figure 4 Stretch 1 rep;Supine;Without overpressure;Limitations    Figure 4 Stretch Limitations 2 min static hold L      Lumbar Exercises: Supine   Other Supine Lumbar Exercises Manually resisted hip hiking 15x on L      Lumbar Exercises: Sidelying   Clam Left    Clam Limitations 30x against manual resistance    Hip Abduction Limitations 30x with PT maintaining sidelie poisition and slight hip extension to isolate gluteus medius on L      Knee/Hip Exercises: Standing   Functional Squat 1 set;10 reps;Limitations    Functional Squat Limitations encouraged foot flat      Manual Therapy   Manual therapy comments No tenderness/TP detcted in L piriformis region                       PT Short Term Goals - 10/28/20 1120       PT SHORT TERM GOAL #1   Title Pt will demonstrate independence with updated and revised HEP    Baseline Independent in current HEP    Time 4    Period Weeks    Status Achieved    Target Date 09/16/20      PT SHORT TERM GOAL #2   Title Pt will demonstrate 10 degrees increase in L hamstring ROM and L hip flexor ROM    Baseline 60 deg hamstring, lacking 20 deg to neutral hip extension LLE Thomas test; 08/30/20 L hamstring ROM 90d  in supine; 10/05/20 90d in supine    Time 4    Period Weeks    Status Achieved    Target Date 09/16/20      PT SHORT TERM GOAL #3   Title Pt will participate in stair negotiation assessment with LTG to be set    Baseline TBD; 10/05/20 Able to negotiate a full flight of steps w/o need of rails with step through pattern    Time 4    Period Weeks    Status Achieved    Target Date 09/16/20      PT SHORT TERM GOAL #4   Title Pt will decrease five time sit to stand to </= 15 seconds without UE support    Baseline 18.2 seconds; 10/05/20 10.9s    Time 4    Period Weeks    Status Achieved    Target Date 09/16/20      PT SHORT TERM GOAL #5   Title Pt will increase gait velocity to >/= 2.8 ft/sec without AD and with decreased antalgic gait sequence    Baseline 2.46 ft/sec; 10/05/20 0.76 m/s or 2.49 ft/s; 10/29/18 Gait speed 2.33 ft/s    Time 4    Period Weeks    Status On-going    Target Date 09/16/20               PT Long Term Goals - 10/19/20 1240       PT LONG TERM GOAL #1   Title Pt will be independent with final HEP and walking program at [facility]    Baseline Patient has returnes to [facility] walking and performing chair yoga    Time 4  Period Weeks    Status Achieved    Target Date 11/23/20      PT LONG TERM GOAL #2   Title Pt will negotiate 16 stairs with one rail, alternating sequence MOD I    Baseline 10/19/20 Patient able to negotiate 16 steps with step throug pattern with B rails    Time 4    Period Weeks    Status Partially Met    Target Date 11/23/20      PT LONG TERM GOAL #3   Title Pt will demonstrate symmetrical hamstring length and hip extension (Thomas test) L to R side    Baseline 10/19/20 B hamstring flexibility esentiall equal, 90d B SLR, Thomas test ROM also equal upon observation    Time 8    Period Weeks    Status Achieved      PT LONG TERM GOAL #4   Title Pt will decrease five time sit to stand to </=13 seconds to indicate decreased falls risk     Baseline 10/19/20 5x STS in 12.6s    Time 4    Period Weeks    Status Achieved    Target Date 11/23/20      PT LONG TERM GOAL #5   Title Pt will increase gait velocity to >/= 3.0 ft/sec without AD    Baseline 10/19/20 gait velocity 0.68 m/s or 2.23 ft/s    Time 4    Period Weeks    Status Partially Met    Target Date 11/23/20                   Plan - 10/28/20 1122     Clinical Impression Statement Todays session serves as 10thvisit progress note.  Patient has met all STGs with the exception of gait speed.  He has recently had an increase in pain following an extended trip to [place].  He continues with soft tissue restrictions through hips and lumbopelvic region sggestive of SI dysfunction.  He remains a good candidate for continued rehab services.    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    Examination-Activity Limitations Bend;Locomotion Level;Stairs;Stand    Examination-Participation Restrictions Community Activity;Other   Fitness   Stability/Clinical Decision Making Evolving/Moderate complexity    Rehab Potential Good    PT Frequency 2x / week    PT Duration 4 weeks    PT Treatment/Interventions ADLs/Self Care Home Management;Aquatic Therapy;DME Instruction;Gait training;Stair training;Functional mobility training;Therapeutic activities;Therapeutic exercise;Balance training;Neuromuscular re-education;Patient/family education;Orthotic Fit/Training;Cryotherapy;Moist Heat;Manual techniques;Passive range of motion;Dry needling;Taping    PT Next Visit Plan L hip flexor  stretching, normalize gait pattern, QL strengthening L, assess SI    PT Home Exercise Plan [email or web address]    Consulted and Agree with Plan of Care Patient             Patient will benefit from skilled therapeutic intervention in order to improve the following deficits and impairments:  Decreased activity tolerance, Decreased balance, Cardiopulmonary status limiting activity, Decreased endurance, Decreased range of motion, Decreased strength, Difficulty walking, Impaired sensation, Postural dysfunction, Pain  Visit Diagnosis: Muscle weakness (generalized)  Unsteadiness on feet  Other abnormalities of gait and mobility     Problem List Patient  Active Problem List   Diagnosis Date Noted   Syncope 05/26/2020   Back pain 62/94/7654   Acute metabolic encephalopathy 65/03/5463   Lumbar spinal stenosis 08/15/2019   Nonspecific chest pain 08/11/2019   Shortness of breath    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 ([place]) 10/21/2018   Nocturnal hypoxemia 10/21/2018   Radiation therapy complication 68/12/7515   Primary prostate cancer ([place]) 07/31/2017   Anemia 07/31/2017   Vertigo due to cerebrovascular disease 07/31/2017   Poor compliance with CPAP treatment 07/31/2017   Absolute anemia 05/16/2017   Avitaminosis D 05/16/2017   Benign essential HTN 05/16/2017   Benign prostatic hypertrophy without urinary obstruction 05/16/2017   Clinical depression 05/16/2017   CN (constipation) 05/16/2017   Current drug use 05/16/2017   Diabetic neuropathy ([place]) 05/16/2017   Genital herpes 05/16/2017   Infarction of lung due to iatrogenic pulmonary embolism ([place]) 05/16/2017   Sciatica associated with disorder of lumbar spine 05/16/2017   Arteriosclerosis of coronary artery 05/16/2017   Artery disease, cerebral 05/16/2017   Apnea, sleep 05/16/2017   Arthralgia of hip or thigh 05/16/2017   Malignant neoplasm of prostate ([place]) 09/28/2016   Vascular dementia in remission ([place])  09/28/2016   Remote history of stroke 09/28/2016   Acute kidney injury ([place]) 05/18/2016   Dehydration 05/18/2016   Near syncope 05/18/2016   Orthostatic hypotension 05/18/2016   CKD (chronic kidney disease), stage II 04/26/2016   UTI (urinary tract infection) 04/26/2016   Encounter for counseling on use of CPAP 11/18/2015   Chronic cholecystitis with calculus 11/02/2015   Stroke, 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[place]) 04/22/2015   TIA (transient ischemic attack) 11/03/2014   OSA on CPAP 05/18/2014   White matter disease 08/14/2013   Abnormal x-ray of temporomandibular joint 05/30/2013   Chronic infection of sinus 05/30/2013   Cough 05/30/2013   Fatigue 05/30/2013   Unsteady gait 05/19/2013   Combined fat and carbohydrate induced hyperlipemia 04/24/2013   Syncope 03/20/2013   Angina pectoris ([place]) 10/08/2012   Hypertension    Stroke ([place])    History of TIAs    Lumbago    Other and unspecified hyperlipidemia    Personal history of unspecified circulatory disease    Unwitnessed fall    Pain in joint, multiple sites    Degeneration of cervical intervertebral disc    Unspecified cardiovascular disease    History of pulmonary embolism: June 2014,  Takes Xarelto 07/01/2012    Class: History of   Hemiparesis ([place]) 07/18/2011   Diabetes mellitus type 2 with complications ([place]) 00/17/4944   CAD in native artery 07/18/2011   History of recurrent TIAs 07/18/2011    Lanice Shirts, PT 10/28/2020, 11:48 AM  [facility] [address] [place], [place], [address] Phone: [phone]   Fax:  [phone]  Name: Steve Brown MRN: [id number] Date of Birth: 1940-12-27

## 2020-11-03 ENCOUNTER — Encounter (HOSPITAL_BASED_OUTPATIENT_CLINIC_OR_DEPARTMENT_OTHER): Payer: Self-pay

## 2020-11-03 ENCOUNTER — Emergency Department (HOSPITAL_BASED_OUTPATIENT_CLINIC_OR_DEPARTMENT_OTHER): Payer: Medicare Other

## 2020-11-03 ENCOUNTER — Other Ambulatory Visit: Payer: Self-pay

## 2020-11-03 ENCOUNTER — Emergency Department (HOSPITAL_BASED_OUTPATIENT_CLINIC_OR_DEPARTMENT_OTHER)
Admission: EM | Admit: 2020-11-03 | Discharge: 2020-11-03 | Disposition: A | Discharge status: Home / Self Care | Payer: Medicare Other | Attending: Emergency Medicine | Admitting: Emergency Medicine

## 2020-11-03 DIAGNOSIS — R5383 Other fatigue: Secondary | ICD-10-CM | POA: Diagnosis present

## 2020-11-03 DIAGNOSIS — Z79899 Other long term (current) drug therapy: Secondary | ICD-10-CM | POA: Insufficient documentation

## 2020-11-03 DIAGNOSIS — I129 Hypertensive chronic kidney disease with stage 1 through stage 4 chronic kidney disease, or unspecified chronic kidney disease: Secondary | ICD-10-CM | POA: Diagnosis not present

## 2020-11-03 DIAGNOSIS — R42 Dizziness and giddiness: Secondary | ICD-10-CM | POA: Diagnosis not present

## 2020-11-03 DIAGNOSIS — J9811 Atelectasis: Secondary | ICD-10-CM | POA: Diagnosis not present

## 2020-11-03 DIAGNOSIS — Z8546 Personal history of malignant neoplasm of prostate: Secondary | ICD-10-CM | POA: Diagnosis not present

## 2020-11-03 DIAGNOSIS — I6523 Occlusion and stenosis of bilateral carotid arteries: Secondary | ICD-10-CM | POA: Diagnosis not present

## 2020-11-03 DIAGNOSIS — Z7901 Long term (current) use of anticoagulants: Secondary | ICD-10-CM | POA: Insufficient documentation

## 2020-11-03 DIAGNOSIS — I10 Essential (primary) hypertension: Secondary | ICD-10-CM | POA: Diagnosis not present

## 2020-11-03 DIAGNOSIS — Z20822 Contact with and (suspected) exposure to covid-19: Secondary | ICD-10-CM | POA: Insufficient documentation

## 2020-11-03 DIAGNOSIS — I2510 Atherosclerotic heart disease of native coronary artery without angina pectoris: Secondary | ICD-10-CM | POA: Diagnosis not present

## 2020-11-03 DIAGNOSIS — E1140 Type 2 diabetes mellitus with diabetic neuropathy, unspecified: Secondary | ICD-10-CM | POA: Insufficient documentation

## 2020-11-03 DIAGNOSIS — F0150 Vascular dementia without behavioral disturbance: Secondary | ICD-10-CM | POA: Insufficient documentation

## 2020-11-03 DIAGNOSIS — N390 Urinary tract infection, site not specified: Secondary | ICD-10-CM | POA: Diagnosis not present

## 2020-11-03 DIAGNOSIS — N182 Chronic kidney disease, stage 2 (mild): Secondary | ICD-10-CM | POA: Diagnosis not present

## 2020-11-03 DIAGNOSIS — R400 Somnolence: Secondary | ICD-10-CM | POA: Diagnosis not present

## 2020-11-03 DIAGNOSIS — E1122 Type 2 diabetes mellitus with diabetic chronic kidney disease: Secondary | ICD-10-CM | POA: Insufficient documentation

## 2020-11-03 DIAGNOSIS — Z7984 Long term (current) use of oral hypoglycemic drugs: Secondary | ICD-10-CM | POA: Diagnosis not present

## 2020-11-03 DIAGNOSIS — Z794 Long term (current) use of insulin: Secondary | ICD-10-CM | POA: Insufficient documentation

## 2020-11-03 DIAGNOSIS — J329 Chronic sinusitis, unspecified: Secondary | ICD-10-CM | POA: Diagnosis not present

## 2020-11-03 DIAGNOSIS — H547 Unspecified visual loss: Secondary | ICD-10-CM | POA: Diagnosis not present

## 2020-11-03 LAB — CBC
HCT: 38.2 % — ABNORMAL LOW (ref 39.0–52.0)
Hemoglobin: 13.2 g/dL (ref 13.0–17.0)
MCH: 31.9 pg (ref 26.0–34.0)
MCHC: 34.6 g/dL (ref 30.0–36.0)
MCV: 92.3 fL (ref 80.0–100.0)
Platelets: 192 10*3/uL (ref 150–400)
RBC: 4.14 MIL/uL — ABNORMAL LOW (ref 4.22–5.81)
RDW: 13.4 % (ref 11.5–15.5)
WBC: 5.3 10*3/uL (ref 4.0–10.5)
nRBC: 0 % (ref 0.0–0.2)

## 2020-11-03 LAB — BASIC METABOLIC PANEL
Anion gap: 7 (ref 5–15)
BUN: 18 mg/dL (ref 8–23)
CO2: 29 mmol/L (ref 22–32)
Calcium: 8.9 mg/dL (ref 8.9–10.3)
Chloride: 100 mmol/L (ref 98–111)
Creatinine, Ser: 1.28 mg/dL — ABNORMAL HIGH (ref 0.61–1.24)
GFR, Estimated: 57 mL/min — ABNORMAL LOW (ref >60)
Glucose, Bld: 190 mg/dL — ABNORMAL HIGH (ref 70–99)
Potassium: 4.2 mmol/L (ref 3.5–5.1)
Sodium: 136 mmol/L (ref 135–145)

## 2020-11-03 LAB — URINALYSIS, ROUTINE W REFLEX MICROSCOPIC
Bilirubin Urine: NEGATIVE
Glucose, UA: NEGATIVE mg/dL
Ketones, ur: NEGATIVE mg/dL
Nitrite: POSITIVE — AB
Protein, ur: NEGATIVE mg/dL
Specific Gravity, Urine: 1.015 (ref 1.005–1.030)
pH: 7 (ref 5.0–8.0)

## 2020-11-03 LAB — URINALYSIS, MICROSCOPIC (REFLEX)

## 2020-11-03 LAB — TROPONIN I (HIGH SENSITIVITY): Troponin I (High Sensitivity): 17 ng/L (ref <18)

## 2020-11-03 LAB — RESP PANEL BY RT-PCR (FLU A&B, COVID) ARPGX2
Influenza A by PCR: NEGATIVE
Influenza B by PCR: NEGATIVE
SARS Coronavirus 2 by RT PCR: NEGATIVE

## 2020-11-03 MED ORDER — CEPHALEXIN 500 MG PO CAPS: 500.0000 mg | ORAL_CAPSULE | Freq: Three times a day (TID) | ORAL | 0 refills | Status: AC

## 2020-11-03 NOTE — ED Provider Notes (Signed)
[facility] Provider Note   CSN: [id number] Arrival date & time: 11/03/20  1330     History Chief Complaint  Patient presents with   Dizziness    Steve Brown is a 80 y.o. male.  This is a 80 y.o. male with significant medical history as below, including OSA on CPAP, CVA, PE, syncope who presents to the ED with complaint of daytime sleepiness, lightheadedness, possible vision changes.   Patient ports over the past patient reports over the past few months he has been having transient episodes of excessive fatigue, vision changes.  He says that he will experience these events without known causation.  Last time he experienced episode was yesterday.  Report he was waiting in line for food sitting in his car when he felt very tired, he felt like his vision got very dark.  He felt as though the room was spinning.  Denies LOC.  No nausea, vomiting, headaches, chest pain, dyspnea.  No numbness or tingling.  Symptoms resolve spontaneously after a few moments.  Patient history of OSA on CPAP, he does report that his mask sometimes does not fit well and sometimes when he does not sleep well the symptoms are more frequent.  He was eval by neurology in the past, started on modafinil for excessive daytime sleepiness.  He feels as though the symptoms have worsened after starting modafinil.  The history is provided by the patient. No language interpreter was used.  Dizziness Associated symptoms: no chest pain, no headaches, no nausea, no palpitations, no shortness of breath and no vomiting       Past Medical History:  Diagnosis Date   Abnormal prostate biopsy    Anticoagulant long-term use    currently xarelto   BPH with elevated PSA    CKD (chronic kidney disease), stage II    Complication of anesthesia    limted neck rom limited use of left arm due to cva   Coronary artery disease    CARDIOLOGIST-  DR Irish Lack--  2010-- PCI w/ stenting midLAD   DDD  (degenerative disc disease), lumbar    Degeneration of cervical intervertebral disc    Depression    Diabetes mellitus without complication (HCC)    Dyspnea on exertion    GERD (gastroesophageal reflux disease)    Hemiparesis due to cerebral infarction    History of cerebrovascular accident (CVA) with residual deficit 2002 and 2003--  hemiparisis both sides   per MRI  anterior left frontal lobe, left para midline pons, and inferior cerebullam bilaterally infarcts   History of pulmonary embolus (PE)    06-30-2012  extensive bilaterally   History of recurrent TIAs    History of syncope    hx multiple pre-syncope and syncopal episodes due to vasovagal, orthostatic hypotension, dehydration   History of TIAs    several since 2002   Hyperlipidemia    Hypertension    Mild atherosclerosis of carotid artery, bilateral    per last duplex 11-04-2014  bilateral ICA 1--39%   Neuropathy    fingers   OSA on CPAP    followed by dr dohmeier--  sev. osa w/ AHI 65.9   Prostate cancer ([place]) dx 2018   Renal insufficiency    S/P coronary artery stent placement 2010   stenting to mid LAD   Simple renal cyst    bilaterally   Stroke [facility])    Trigger finger of both hands 11-17-13   Type 2 diabetes mellitus ([place]) dx 1986  last one A1c 9.2 on 04-26-2016   Unsteady gait    . Hx prior CVA/TIAs;   Vertebral artery occlusion, left    chronic    Patient Active Problem List   Diagnosis Date Noted   Syncope 05/26/2020   Back pain 33/35/4562   Acute metabolic encephalopathy 56/38/9373   Lumbar spinal stenosis 08/15/2019   Nonspecific chest pain 08/11/2019   Shortness of breath    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 ([place]) 10/21/2018   Nocturnal hypoxemia 10/21/2018   Radiation therapy complication 42/87/6811   Primary prostate cancer ([place]) 07/31/2017   Anemia 07/31/2017   Vertigo due to cerebrovascular disease 07/31/2017   Poor compliance with CPAP treatment 07/31/2017   Absolute  anemia 05/16/2017   Avitaminosis D 05/16/2017   Benign essential HTN 05/16/2017   Benign prostatic hypertrophy without urinary obstruction 05/16/2017   Clinical depression 05/16/2017   CN (constipation) 05/16/2017   Current drug use 05/16/2017   Diabetic neuropathy ([place]) 05/16/2017   Genital herpes 05/16/2017   Infarction of lung due to iatrogenic pulmonary embolism ([place]) 05/16/2017   Sciatica associated with disorder of lumbar spine 05/16/2017   Arteriosclerosis of coronary artery 05/16/2017   Artery disease, cerebral 05/16/2017   Apnea, sleep 05/16/2017   Arthralgia of hip or thigh 05/16/2017   Malignant neoplasm of prostate ([place]) 09/28/2016   Vascular dementia in remission ([place]) 09/28/2016   Remote history of stroke 09/28/2016   Acute kidney injury ([place]) 05/18/2016   Dehydration 05/18/2016   Near syncope 05/18/2016   Orthostatic hypotension 05/18/2016   CKD (chronic kidney disease), stage II 04/26/2016   UTI (urinary tract infection) 04/26/2016   Encounter for counseling on use of CPAP 11/18/2015   Chronic cholecystitis with calculus 11/02/2015   Stroke, 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[place]) 04/22/2015   TIA (transient ischemic attack) 11/03/2014   OSA on CPAP 05/18/2014   White matter disease 08/14/2013   Abnormal x-ray of temporomandibular joint 05/30/2013   Chronic infection of sinus 05/30/2013   Cough 05/30/2013   Fatigue 05/30/2013   Unsteady gait 05/19/2013   Combined fat and carbohydrate induced hyperlipemia 04/24/2013   Syncope 03/20/2013   Angina pectoris ([place]) 10/08/2012   Hypertension    Stroke ([place])    History of TIAs    Lumbago    Other and unspecified hyperlipidemia    Personal history of unspecified circulatory disease    Unwitnessed fall    Pain in joint, multiple sites    Degeneration of cervical intervertebral disc    Unspecified cardiovascular disease    History of pulmonary embolism: June 2014,  Takes Xarelto 07/01/2012    Class: History of   Hemiparesis  ([place]) 07/18/2011   Diabetes mellitus type 2 with complications ([place]) 57/26/2035   CAD in native artery 07/18/2011   History of recurrent TIAs 07/18/2011    Past Surgical History:  Procedure Laterality Date   ANTERIOR CERVICAL DECOMP/DISCECTOMY FUSION  2004   C3 -- C6 limited rom   CARDIAC CATHETERIZATION  06-10-2010   dr Irish Lack   wide patent LAD stent, mid lesion at the origin of the septal prior to the previous stent 40-50%/  normal LVF, ef 55%   CARDIOVASCULAR STRESS TEST  10-23-2012  dr Irish Lack   normal nuclear perfusion study w/ no ischemia/  normal LV function and wall motion , ef 65%   CARPAL TUNNEL RELEASE Bilateral    CATARACT EXTRACTION W/ INTRAOCULAR LENS  IMPLANT, BILATERAL     CHOLECYSTECTOMY N/A 11/02/2015   Procedure: LAPAROSCOPIC CHOLECYSTECTOMY WITH INTRAOPERATIVE  CHOLANGIOGRAM;  Surgeon: Donnie Mesa, MD;  Location: [facility];  Service: General;  Laterality: N/A;   COLONOSCOPY     CORONARY ANGIOPLASTY WITH STENT PLACEMENT  02/2008   stenting to mid LAD   GOLD SEED IMPLANT N/A 11/15/2016   Procedure: [place];  Surgeon: Ardis Hughs, MD;  Location: [facility];  Service: Urology;  Laterality: N/A;   IR ANGIO INTRA EXTRACRAN SEL COM CAROTID INNOMINATE BILAT MOD SED  06/13/2018   IR ANGIO VERTEBRAL SEL VERTEBRAL UNI R MOD SED  06/13/2018   IR US GUIDE VASC ACCESS RIGHT  06/13/2018   LEFT HEART CATH AND CORONARY ANGIOGRAPHY N/A 05/25/2017   Procedure: LEFT HEART CATH AND CORONARY ANGIOGRAPHY;  Surgeon: Jettie Booze, MD;  Location: [facility];  Service: Cardiovascular;  Laterality: N/A;   LEFT HEART CATHETERIZATION WITH CORONARY ANGIOGRAM N/A 04/03/2013   Procedure: LEFT HEART CATHETERIZATION WITH CORONARY ANGIOGRAM;  Surgeon: Jettie Booze, MD;  Location: [facility] CATH LAB;  Service: Cardiovascular;  Laterality: N/A;  patent mLAD stent  w/ mild disease in remainder LAD and its branches;  mod. focal lesion midLCFx- FFR of  lesion was negative for ischemia/  normal LVSF, ef 50%   lungs  2005   "fluid pumped off lungs"   NEUROPLASTY / TRANSPOSITION ULNAR NERVE AT ELBOW Right 2004   PROSTATE BIOPSY N/A 08/31/2016   Procedure: PROSTATE  BIOPSY TRANSRECTAL ULTRASONIC PROSTATE (TUBP);  Surgeon: Ardis Hughs, MD;  Location[facility];  Service: Urology;  Laterality: N/A;   SPACE OAR INSTILLATION N/A 11/15/2016   Procedure: SPACE OAR INSTILLATION;  Surgeon: Ardis Hughs, MD;  Location[facility];  Service: Urology;  Laterality: N/A;   TRANSTHORACIC ECHOCARDIOGRAM  04/27/2016   severe focal basal LVH, ef 60-65%,  grade 2 diastoilc dysfunction/  mild AR, MR, and TR/  atrial septum lipomatous hypertrophy/  PASP [id number]   UMBILICAL HERNIA REPAIR         Family History  Problem Relation Age of Onset   Aneurysm Mother    Cancer Father        unknown either pancreatic or prostate   Stroke Brother    Dementia Sister    Heart attack Neg Hx     Social History   Tobacco Use   Smoking status: Never   Smokeless tobacco: Never  Vaping Use   Vaping Use: Never used  Substance Use Topics   Alcohol use: Not Currently   Drug use: Never    Home Medications Prior to Admission medications   Medication Sig Start Date End Date Taking? Authorizing Provider  cephALEXin (KEFLEX) 500 MG capsule Take 1 capsule (500 mg total) by mouth 3 (three) times daily for 7 days. 11/03/20 11/10/20 Yes Jeanell Sparrow, DO  acetaminophen (TYLENOL) 500 MG tablet Take 500 mg by mouth every 6 (six) hours as needed (pain).    [provider]  albuterol (VENTOLIN HFA) 108 (90 Base) MCG/ACT inhaler Inhale 1-2 puffs into the lungs every 6 (six) hours as needed for wheezing or shortness of breath. 09/06/20   Ward, Lenise Arena, PA-C  bisacodyl (DULCOLAX) 10 MG suppository Place 1 suppository (10 mg total) rectally daily as needed for moderate constipation. 05/31/20   Antonieta Pert, MD  diclofenac Sodium  (VOLTAREN) 1 % GEL Apply 4 g topically 4 (four) times daily. 09/03/19   Deno Etienne, DO  fluticasone (FLONASE) 50 MCG/ACT nasal spray Place 1 spray into both nostrils  daily as needed for allergies. 06/13/20   Medina-Vargas, Monina C, NP  furosemide (LASIX) 20 MG tablet Take 1-2 tablets (20-40 mg total) by mouth daily as needed for fluid. 06/13/20   Medina-Vargas, Monina C, NP  hydrochlorothiazide (MICROZIDE) 12.5 MG capsule Take 1 capsule (12.5 mg total) by mouth daily. 06/13/20   Medina-Vargas, Monina C, NP  insulin aspart (NOVOLOG) 100 UNIT/ML FlexPen Inject 5 Units into the skin 3 (three) times daily with meals. 06/13/20   Medina-Vargas, Monina C, NP  losartan (COZAAR) 100 MG tablet Take 1 tablet (100 mg total) by mouth daily. 06/13/20   Medina-Vargas, Monina C, NP  methocarbamol (ROBAXIN) 500 MG tablet Take 1 tablet (500 mg total) by mouth every 8 (eight) hours as needed for muscle spasms. 06/13/20   Medina-Vargas, Monina C, NP  modafinil (PROVIGIL) 100 MG tablet Take 1 tablet (100 mg total) by mouth daily. 08/23/20   Ward Givens, NP  oxyCODONE (OXY IR/ROXICODONE) 5 MG immediate release tablet Take 1 tablet (5 mg total) by mouth every 6 (six) hours as needed for up to 6 doses for severe pain. 06/08/20   Medina-Vargas, Monina C, NP  pantoprazole (PROTONIX) 40 MG tablet Take 1 tablet (40 mg total) by mouth every morning. 06/13/20   Medina-Vargas, Monina C, NP  polyethylene glycol (MIRALAX / GLYCOLAX) 17 g packet Take 17 g by mouth daily. 05/31/20   Antonieta Pert, MD  pregabalin (LYRICA) 300 MG capsule Take 300 mg by mouth 2 (two) times daily. 04/26/20   [provider]  rosuvastatin (CRESTOR) 10 MG tablet Take 1 tablet (10 mg total) by mouth daily. 06/13/20   Medina-Vargas, Monina C, NP  sitaGLIPtin (JANUVIA) 50 MG tablet Take 1 tablet (50 mg total) by mouth daily. 06/13/20   Medina-Vargas, Monina C, NP  TRESIBA FLEXTOUCH 100 UNIT/ML FlexTouch Pen Inject 34 Units into the skin daily. 06/13/20   Medina-Vargas, Monina  C, NP  XARELTO 20 MG TABS tablet Take 1 tablet (20 mg total) by mouth daily. 06/13/20   Medina-Vargas, Monina C, NP    Allergies    Other, Phenergan [promethazine], Iohexol, Iohexol, Phenergan [promethazine], and Tramadol  Review of Systems   Review of Systems  Constitutional:  Positive for fatigue. Negative for chills and fever.  HENT:  Negative for facial swelling and trouble swallowing.   Eyes:  Positive for visual disturbance. Negative for photophobia.  Respiratory:  Negative for cough and shortness of breath.   Cardiovascular:  Negative for chest pain and palpitations.  Gastrointestinal:  Negative for abdominal pain, nausea and vomiting.  Endocrine: Negative for polydipsia and polyuria.  Genitourinary:  Negative for difficulty urinating and hematuria.  Musculoskeletal:  Negative for gait problem and joint swelling.  Skin:  Negative for pallor and rash.  Neurological:  Positive for dizziness and light-headedness. Negative for syncope and headaches.  Psychiatric/Behavioral:  Negative for agitation and confusion.    Physical Exam Updated Vital Signs BP (!) 142/78   Pulse (!) 54   Temp 98.8 F (37.1 C) (Oral)   Resp 16   Ht 5\' 5"  (1.651 m)   Wt 93 kg   SpO2 96%   BMI 34.11 kg/m   Physical Exam Vitals and nursing note reviewed.  Constitutional:      General: He is not in acute distress.    Appearance: Normal appearance. He is well-developed. He is not ill-appearing.  HENT:     Head: Normocephalic and atraumatic.     Right Ear: External ear normal.  Left Ear: External ear normal.     Mouth/Throat:     Mouth: Mucous membranes are moist.  Eyes:     General: No scleral icterus.    Extraocular Movements: Extraocular movements intact.     Conjunctiva/sclera: Conjunctivae normal.     Pupils: Pupils are equal, round, and reactive to light.  Cardiovascular:     Rate and Rhythm: Normal rate and regular rhythm.     Pulses: Normal pulses.     Heart sounds: Normal heart  sounds.  Pulmonary:     Effort: Pulmonary effort is normal. No respiratory distress.     Breath sounds: Normal breath sounds.  Abdominal:     General: Abdomen is flat.     Palpations: Abdomen is soft.     Tenderness: There is no abdominal tenderness.  Musculoskeletal:        General: Normal range of motion.     Cervical back: Normal range of motion.     Right lower leg: No edema.     Left lower leg: No edema.  Skin:    General: Skin is warm and dry.     Capillary Refill: Capillary refill takes less than 2 seconds.  Neurological:     Mental Status: He is alert and oriented to person, place, and time.     GCS: GCS eye subscore is 4. GCS verbal subscore is 5. GCS motor subscore is 6.     Cranial Nerves: Cranial nerves 2-12 are intact. No facial asymmetry.     Sensory: Sensation is intact.     Motor: Motor function is intact. No pronator drift.     Coordination: Coordination is intact. Finger-Nose-Finger Test normal.     Gait: Gait is intact. Gait normal.  Psychiatric:        Mood and Affect: Mood normal.        Behavior: Behavior normal.    ED Results / Procedures / Treatments   Labs (all labs ordered are listed, but only abnormal results are displayed) Labs Reviewed  BASIC METABOLIC PANEL - Abnormal; Notable for the following components:      Result Value   Glucose, Bld 190 (*)    Creatinine, Ser 1.28 (*)    GFR, Estimated 57 (*)    All other components within normal limits  CBC - Abnormal; Notable for the following components:   RBC 4.14 (*)    HCT 38.2 (*)    All other components within normal limits  URINALYSIS, ROUTINE W REFLEX MICROSCOPIC - Abnormal; Notable for the following components:   APPearance HAZY (*)    Hgb urine dipstick SMALL (*)    Nitrite POSITIVE (*)    Leukocytes,Ua MODERATE (*)    All other components within normal limits  URINALYSIS, MICROSCOPIC (REFLEX) - Abnormal; Notable for the following components:   Bacteria, UA FEW (*)    All other  components within normal limits  URINE CULTURE  RESP PANEL BY RT-PCR (FLU A&B, COVID) ARPGX2  TROPONIN I (HIGH SENSITIVITY)    EKG EKG Interpretation  Date/Time:  Wednesday November 03 2020 13:46:10 EDT Ventricular Rate:  68 PR Interval:  160 QRS Duration: 82 QT Interval:  370 QTC Calculation: 393 R Axis:   -2 Text Interpretation: Normal sinus rhythm Normal ECG Confirmed by Regan Lemming ([id number]) on 11/03/2020 1:56:12 PM  Radiology CT Head Wo Contrast  Result Date: 11/03/2020 CLINICAL DATA:  Vision loss, binocular transient vision loss last 2-3 wks EXAM: CT HEAD WITHOUT CONTRAST TECHNIQUE: Contiguous axial images were  obtained from the base of the skull through the vertex without intravenous contrast. COMPARISON:  CT head 09/03/2019 BRAIN: BRAIN Patchy and confluent areas of decreased attenuation are noted throughout the deep and periventricular white matter of the cerebral hemispheres bilaterally, compatible with chronic microvascular ischemic disease. No evidence of large-territorial acute infarction. No parenchymal hemorrhage. No mass lesion. No extra-axial collection. No mass effect or midline shift. No hydrocephalus. Basilar cisterns are patent. Vascular: No hyperdense vessel. Atherosclerotic calcifications are present within the cavernous internal carotid arteries. Skull: No acute fracture or focal lesion. Sinuses/Orbits: Complete opacification of the right sphenoid sinus. Partial opacification of the left sphenoid sinus and left ethmoid sinus. Otherwise paranasal sinuses and mastoid air cells are clear. Bilateral lens replacement. Otherwise orbits are unremarkable. Other: None. IMPRESSION: 1. No acute intracranial abnormality. 2. Sinus disease. Electronically Signed   By: Iven Finn M.D.   On: 11/03/2020 18:11   DG Chest Portable 1 View  Result Date: 11/03/2020 CLINICAL DATA:  Possible syncope EXAM: PORTABLE CHEST 1 VIEW COMPARISON:  09/06/2020 FINDINGS: Probable  scarring/atelectasis at the lung bases. No pleural effusion or pneumothorax. Similar cardiomediastinal contours. IMPRESSION: Mild scarring/atelectasis at the lung bases. Electronically Signed   By: Macy Mis M.D.   On: 11/03/2020 18:06    Procedures Procedures   Medications Ordered in ED Medications - No data to display  ED Course  I have reviewed the triage vital signs and the nursing notes.  Pertinent labs & imaging results that were available during my care of the patient were reviewed by me and considered in my medical decision making (see chart for details).    MDM Rules/Calculators/A&P                           CC: dizziness  This patient complains of symptoms as above; this involves an extensive number of treatment options and is a complaint that carries with it a high risk of complications and morbidity. Vital signs were reviewed. Serious etiologies considered.  Record review:  Previous records obtained and reviewed   Work up as above, notable for:  Labs & imaging results that were available during my care of the patient were reviewed by me and considered in my medical decision making.   I ordered imaging studies which included CTH, CXR and I independently visualized and interpreted imaging which showed no acute process  Troponin negative, EKG without acute ischemic changes. NO CP.  CXR non-acute. ACS is unlikely.    Reassessment:  D/w neurology Dr Quinn Axe; agree that symptoms likely are 2/2 excessive daytime sleepiness and recommend patient f/u in the office regarding medication management, chronic complaints.  Patient with UTI, he has urgency/frequency, this could be exacerbating his underlying chronic conditions. Will start oral abx, sent urine culture.   The patient improved significantly and was discharged in stable condition. Detailed discussions were had with the patient regarding current findings, and need for close f/u with PCP or on call doctor. The patient  has been instructed to return immediately if the symptoms worsen in any way for re-evaluation. Patient verbalized understanding and is in agreement with current care plan. All questions answered prior to discharge.           This chart was dictated using voice recognition software.  Despite best efforts to proofread,  errors can occur which can change the documentation meaning.  Final Clinical Impression(s) / ED Diagnoses Final diagnoses:  Urinary tract infection without hematuria, site unspecified  Daytime sleepiness    Rx / DC Orders ED Discharge Orders          Ordered    cephALEXin (KEFLEX) 500 MG capsule  3 times daily        11/03/20 1838             Jeanell Sparrow, DO 11/03/20 1959

## 2020-11-03 NOTE — ED Triage Notes (Signed)
Pt c/o dizziness x 1 week-states he had a "a little HA"-denies fever/flu sx-NAD-steady gait

## 2020-11-03 NOTE — Discharge Instructions (Addendum)
If your covid 19 test is positive we will call you to notify. It is also available in mychart.  Please follow up with sleep specialist regarding CPAP and your sleep apnea.

## 2020-11-04 LAB — URINE CULTURE: Culture: NO GROWTH

## 2020-11-05 ENCOUNTER — Ambulatory Visit: Payer: Medicare Other

## 2020-11-05 DIAGNOSIS — E11319 Type 2 diabetes mellitus with unspecified diabetic retinopathy without macular edema: Secondary | ICD-10-CM | POA: Diagnosis not present

## 2020-11-05 DIAGNOSIS — E782 Mixed hyperlipidemia: Secondary | ICD-10-CM | POA: Diagnosis not present

## 2020-11-05 DIAGNOSIS — D649 Anemia, unspecified: Secondary | ICD-10-CM | POA: Diagnosis not present

## 2020-11-05 DIAGNOSIS — K219 Gastro-esophageal reflux disease without esophagitis: Secondary | ICD-10-CM | POA: Diagnosis not present

## 2020-11-05 DIAGNOSIS — E1140 Type 2 diabetes mellitus with diabetic neuropathy, unspecified: Secondary | ICD-10-CM | POA: Diagnosis not present

## 2020-11-05 DIAGNOSIS — I10 Essential (primary) hypertension: Secondary | ICD-10-CM | POA: Diagnosis not present

## 2020-11-05 DIAGNOSIS — F329 Major depressive disorder, single episode, unspecified: Secondary | ICD-10-CM | POA: Diagnosis not present

## 2020-11-05 DIAGNOSIS — N183 Chronic kidney disease, stage 3 unspecified: Secondary | ICD-10-CM | POA: Diagnosis not present

## 2020-11-08 ENCOUNTER — Other Ambulatory Visit: Payer: Self-pay | Admitting: Adult Health

## 2020-11-08 DIAGNOSIS — H53483 Generalized contraction of visual field, bilateral: Secondary | ICD-10-CM | POA: Diagnosis not present

## 2020-11-08 DIAGNOSIS — E1149 Type 2 diabetes mellitus with other diabetic neurological complication: Secondary | ICD-10-CM

## 2020-11-08 DIAGNOSIS — H53482 Generalized contraction of visual field, left eye: Secondary | ICD-10-CM | POA: Diagnosis not present

## 2020-11-08 DIAGNOSIS — H53481 Generalized contraction of visual field, right eye: Secondary | ICD-10-CM | POA: Diagnosis not present

## 2020-11-12 ENCOUNTER — Other Ambulatory Visit: Payer: Self-pay

## 2020-11-12 ENCOUNTER — Ambulatory Visit: Payer: Medicare Other | Attending: Family Medicine

## 2020-11-12 DIAGNOSIS — R2681 Unsteadiness on feet: Secondary | ICD-10-CM | POA: Insufficient documentation

## 2020-11-12 DIAGNOSIS — R2689 Other abnormalities of gait and mobility: Secondary | ICD-10-CM | POA: Diagnosis not present

## 2020-11-12 DIAGNOSIS — M5432 Sciatica, left side: Secondary | ICD-10-CM | POA: Diagnosis not present

## 2020-11-12 DIAGNOSIS — R208 Other disturbances of skin sensation: Secondary | ICD-10-CM | POA: Diagnosis not present

## 2020-11-12 DIAGNOSIS — M6281 Muscle weakness (generalized): Secondary | ICD-10-CM | POA: Insufficient documentation

## 2020-11-12 NOTE — Therapy (Signed)
[facility] [address] [place], [place], [address] Phone: [phone]   Fax:  [phone]  Physical Therapy Treatment  Patient Details  Name: Steve Brown MRN: [id number] Date of Birth: 03-04-1940 Referring Provider (PT): Daisy Floro, MD   Encounter Date: 11/12/2020   PT End of Session - 11/12/20 1319     Visit Number 11    Number of Visits 17    Date for PT Re-Evaluation 10/15/20    Authorization Type Medicare A&B; 10th visit PN    Authorization Time Period 8/8-10/8    Progress Note Due on Visit 20    PT Start Time 1230    PT Stop Time 1315    PT Time Calculation (min) 45 min             Past Medical History:  Diagnosis Date   Abnormal prostate biopsy    Anticoagulant long-term use    currently xarelto   BPH with elevated PSA    CKD (chronic kidney disease), stage II    Complication of anesthesia    limted neck rom limited use of left arm due to cva   Coronary artery disease    CARDIOLOGIST-  DR Eldridge Dace--  2010-- PCI w/ stenting midLAD   DDD (degenerative disc disease), lumbar    Degeneration of cervical intervertebral disc    Depression    Diabetes mellitus without complication (HCC)    Dyspnea on exertion    GERD (gastroesophageal reflux disease)    Hemiparesis due to cerebral infarction    History of cerebrovascular accident (CVA) with residual deficit 2002 and 2003--  hemiparisis both sides   per MRI  anterior left frontal lobe, left para midline pons, and inferior cerebullam bilaterally infarcts   History of pulmonary embolus (PE)    06-30-2012  extensive bilaterally   History of recurrent TIAs    History of syncope    hx multiple pre-syncope and syncopal episodes due to vasovagal, orthostatic hypotension, dehydration   History of TIAs    several since 2002   Hyperlipidemia    Hypertension    Mild atherosclerosis of carotid artery, bilateral    per last duplex 11-04-2014   bilateral ICA 1--39%   Neuropathy    fingers   OSA on CPAP    followed by dr dohmeier--  sev. osa w/ AHI 65.9   Prostate cancer (HCC) dx 2018   Renal insufficiency    S/P coronary artery stent placement 2010   stenting to mid LAD   Simple renal cyst    bilaterally   Stroke [facility])    Trigger finger of both hands 11-17-13   Type 2 diabetes mellitus (HCC) dx 1986   last one A1c 9.2 on 04-26-2016   Unsteady gait    . Hx prior CVA/TIAs;   Vertebral artery occlusion, left    chronic    Past Surgical History:  Procedure Laterality Date   ANTERIOR CERVICAL DECOMP/DISCECTOMY FUSION  2004   C3 -- C6 limited rom   CARDIAC CATHETERIZATION  06-10-2010   dr Eldridge Dace   wide patent LAD stent, mid lesion at the origin of the septal prior to the previous stent 40-50%/  normal LVF, ef 55%   CARDIOVASCULAR STRESS TEST  10-23-2012  dr Eldridge Dace   normal nuclear perfusion study w/ no ischemia/  normal LV function and wall motion , ef 65%   CARPAL TUNNEL RELEASE Bilateral    CATARACT EXTRACTION W/ INTRAOCULAR LENS  IMPLANT, BILATERAL  CHOLECYSTECTOMY N/A 11/02/2015   Procedure: LAPAROSCOPIC CHOLECYSTECTOMY WITH INTRAOPERATIVE CHOLANGIOGRAM;  Surgeon: Donnie Mesa, MD;  Location: [facility];  Service: General;  Laterality: N/A;   COLONOSCOPY     CORONARY ANGIOPLASTY WITH STENT PLACEMENT  02/2008   stenting to mid LAD   GOLD SEED IMPLANT N/A 11/15/2016   Procedure: [place];  Surgeon: Ardis Hughs, MD;  Location: [facility];  Service: Urology;  Laterality: N/A;   IR ANGIO INTRA EXTRACRAN SEL COM CAROTID INNOMINATE BILAT MOD SED  06/13/2018   IR ANGIO VERTEBRAL SEL VERTEBRAL UNI R MOD SED  06/13/2018   IR US GUIDE VASC ACCESS RIGHT  06/13/2018   LEFT HEART CATH AND CORONARY ANGIOGRAPHY N/A 05/25/2017   Procedure: LEFT HEART CATH AND CORONARY ANGIOGRAPHY;  Surgeon: Jettie Booze, MD;  Location: [facility];  Service: Cardiovascular;  Laterality: N/A;    LEFT HEART CATHETERIZATION WITH CORONARY ANGIOGRAM N/A 04/03/2013   Procedure: LEFT HEART CATHETERIZATION WITH CORONARY ANGIOGRAM;  Surgeon: Jettie Booze, MD;  Location: [facility] CATH LAB;  Service: Cardiovascular;  Laterality: N/A;  patent mLAD stent  w/ mild disease in remainder LAD and its branches;  mod. focal lesion midLCFx- FFR of lesion was negative for ischemia/  normal LVSF, ef 50%   lungs  2005   "fluid pumped off lungs"   NEUROPLASTY / TRANSPOSITION ULNAR NERVE AT ELBOW Right 2004   PROSTATE BIOPSY N/A 08/31/2016   Procedure: PROSTATE  BIOPSY TRANSRECTAL ULTRASONIC PROSTATE (TUBP);  Surgeon: Ardis Hughs, MD;  Location: [facility];  Service: Urology;  Laterality: N/A;   SPACE OAR INSTILLATION N/A 11/15/2016   Procedure: SPACE OAR INSTILLATION;  Surgeon: Ardis Hughs, MD;  Location: [facility];  Service: Urology;  Laterality: N/A;   TRANSTHORACIC ECHOCARDIOGRAM  04/27/2016   severe focal basal LVH, ef 60-65%,  grade 2 diastoilc dysfunction/  mild AR, MR, and TR/  atrial septum lipomatous hypertrophy/  PASP [id number]   UMBILICAL HERNIA REPAIR      There were no vitals filed for this visit.   Subjective Assessment - 11/12/20 1247     Subjective Continues to report "sciatica" but symptoms located to anterio L thigh and hip flexors    Pertinent History Long-term anticoagulant use, BPH with elevated PSA, CKD, CAD, DDD lumbar, degeneration of cervical intervertebral disc, depression, DM, DOE, hemiparesis due to CVA, multiple CVA, ACDF C3-C6, bilat carpal tunnel release, PE, h/o syncope, h/o TIA, HLD, HTN, neuropathy, OSA, prostate CA, Vertebral artery occlusion, neuroplasty/transposition ulnar nn at elbow    Limitations Walking;Standing    How long can you sit comfortably? unimited    How long can you stand comfortably? <15 minutes    How long can you walk comfortably? <15 minutes    Patient Stated Goals To relieve LLE pain, improve walking     Pain Onset More than a month ago                               [facility] Adult PT Treatment/Exercise - 11/12/20 0001       Ambulation/Gait   Ambulation/Gait Yes    Ambulation/Gait Assistance 6: Modified independent (Device/Increase time)    Ambulation Distance (Feet) 200 Feet    Assistive device None    Ambulation Surface Level;Indoor      Lumbar Exercises: Stretches   Hip Flexor Stretch Left;1 rep    Hip Flexor Stretch  Limitations 2 min hold      Lumbar Exercises: Sidelying   Clam Left    Clam Limitations 30x against manual resistance    Hip Abduction Limitations 30x with PT maintaining sidelie poisition and slight hip extension to isolate gluteus medius on L      Knee/Hip Exercises: Stretches   Other Knee/Hip Stretches L hip flexor stretch, 2 min hold with tactile assist from PT performed in R sidelie      Manual Therapy   Manual Therapy Joint mobilization;Soft tissue mobilization;Passive ROM    Manual therapy comments TP release, STM to L qiads/ psoas in supine    Joint Mobilization assessment of B SI mobility with some discomfort with L PSIS PA mob. performed PA mobs to L1-5 in prone and L5-1 MWMs instanding lumbar extension.                       PT Short Term Goals - 10/28/20 1120       PT SHORT TERM GOAL #1   Title Pt will demonstrate independence with updated and revised HEP    Baseline Independent in current HEP    Time 4    Period Weeks    Status Achieved    Target Date 09/16/20      PT SHORT TERM GOAL #2   Title Pt will demonstrate 10 degrees increase in L hamstring ROM and L hip flexor ROM    Baseline 60 deg hamstring, lacking 20 deg to neutral hip extension LLE Thomas test; 08/30/20 L hamstring ROM 90d in supine; 10/05/20 90d in supine    Time 4    Period Weeks    Status Achieved    Target Date 09/16/20      PT SHORT TERM GOAL #3   Title Pt will participate in stair negotiation assessment with LTG to be set    Baseline TBD;  10/05/20 Able to negotiate a full flight of steps w/o need of rails with step through pattern    Time 4    Period Weeks    Status Achieved    Target Date 09/16/20      PT SHORT TERM GOAL #4   Title Pt will decrease five time sit to stand to </= 15 seconds without UE support    Baseline 18.2 seconds; 10/05/20 10.9s    Time 4    Period Weeks    Status Achieved    Target Date 09/16/20      PT SHORT TERM GOAL #5   Title Pt will increase gait velocity to >/= 2.8 ft/sec without AD and with decreased antalgic gait sequence    Baseline 2.46 ft/sec; 10/05/20 0.76 m/s or 2.49 ft/s; 10/29/18 Gait speed 2.33 ft/s    Time 4    Period Weeks    Status On-going    Target Date 09/16/20               PT Long Term Goals - 10/19/20 1240       PT LONG TERM GOAL #1   Title Pt will be independent with final HEP and walking program at [facility] Patient has returnes to [facility] walking and performing chair yoga    Time 4    Period Weeks    Status Achieved    Target Date 11/23/20      PT LONG TERM GOAL #2   Title Pt will negotiate 16 stairs with one rail, alternating sequence MOD I  Baseline 10/19/20 Patient able to negotiate 16 steps with step throug pattern with B rails    Time 4    Period Weeks    Status Partially Met    Target Date 11/23/20      PT LONG TERM GOAL #3   Title Pt will demonstrate symmetrical hamstring length and hip extension (Thomas test) L to R side    Baseline 10/19/20 B hamstring flexibility esentiall equal, 90d B SLR, Thomas test ROM also equal upon observation    Time 8    Period Weeks    Status Achieved      PT LONG TERM GOAL #4   Title Pt will decrease five time sit to stand to </=13 seconds to indicate decreased falls risk    Baseline 10/19/20 5x STS in 12.6s    Time 4    Period Weeks    Status Achieved    Target Date 11/23/20      PT LONG TERM GOAL #5   Title Pt will increase gait velocity to >/= 3.0 ft/sec without AD    Baseline 10/19/20 gait  velocity 0.68 m/s or 2.23 ft/s    Time 4    Period Weeks    Status Partially Met    Target Date 11/23/20                   Plan - 11/12/20 1319     Clinical Impression Statement session focused on comntinued cause of L lumdosacral symptoms and ant thigh pain.  TPs noted in L quads(intermedius. rectus femoris) and psoas.  Decreased mobility through lumbar spine ans would benefit from further assessment of segmenal mobility    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    Examination-Activity Limitations Bend;Locomotion Level;Stairs;Stand    Examination-Participation Restrictions Community Activity;Other   Fitness   Stability/Clinical Decision Making Evolving/Moderate complexity    Rehab Potential Good    PT Frequency 2x / week    PT Duration 4 weeks    PT Treatment/Interventions ADLs/Self Care Home Management;Aquatic Therapy;DME Instruction;Gait training;Stair training;Functional mobility training;Therapeutic activities;Therapeutic exercise;Balance training;Neuromuscular re-education;Patient/family education;Orthotic Fit/Training;Cryotherapy;Moist Heat;Manual techniques;Passive range of motion;Dry needling;Taping    PT Next Visit Plan L hip flexor stretching, normalize gait pattern, QL strengthening L, assess LS mobility    PT Home Exercise Plan [email or web address]    Consulted and Agree with Plan of Care Patient             Patient will benefit from skilled therapeutic intervention in order to improve the following deficits and impairments:  Decreased activity tolerance, Decreased balance,  Cardiopulmonary status limiting activity, Decreased endurance, Decreased range of motion, Decreased strength, Difficulty walking, Impaired sensation, Postural dysfunction, Pain  Visit Diagnosis: Muscle weakness (generalized)  Unsteadiness on feet  Other abnormalities of gait and mobility  Sciatica, left side     Problem List Patient Active Problem List   Diagnosis Date Noted   Syncope 05/26/2020   Back pain 51/02/5850   Acute metabolic encephalopathy 77/82/4235   Lumbar spinal stenosis 08/15/2019   Nonspecific chest pain 08/11/2019   Shortness of breath    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 ([place]) 10/21/2018   Nocturnal hypoxemia 10/21/2018   Radiation  therapy complication 16/10/9602   Primary prostate cancer ([place]) 07/31/2017   Anemia 07/31/2017   Vertigo due to cerebrovascular disease 07/31/2017   Poor compliance with CPAP treatment 07/31/2017   Absolute anemia 05/16/2017   Avitaminosis D 05/16/2017   Benign essential HTN 05/16/2017   Benign prostatic hypertrophy without urinary obstruction 05/16/2017   Clinical depression 05/16/2017   CN (constipation) 05/16/2017   Current drug use 05/16/2017   Diabetic neuropathy ([place]) 05/16/2017   Genital herpes 05/16/2017   Infarction of lung due to iatrogenic pulmonary embolism ([place]) 05/16/2017   Sciatica associated with disorder of lumbar spine 05/16/2017   Arteriosclerosis of coronary artery 05/16/2017   Artery disease, cerebral 05/16/2017   Apnea, sleep 05/16/2017   Arthralgia of hip or thigh 05/16/2017   Malignant neoplasm of prostate ([place]) 09/28/2016   Vascular dementia in remission ([place]) 09/28/2016   Remote history of stroke 09/28/2016   Acute kidney injury ([place]) 05/18/2016   Dehydration 05/18/2016   Near syncope 05/18/2016   Orthostatic hypotension 05/18/2016   CKD (chronic kidney disease), stage II 04/26/2016   UTI (urinary tract infection) 04/26/2016   Encounter for counseling on use of CPAP  11/18/2015   Chronic cholecystitis with calculus 11/02/2015   Stroke, 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) 10/08/2012   Hypertension    Stroke ([place])    History of TIAs    Lumbago    Other and unspecified hyperlipidemia    Personal history of unspecified circulatory disease    Unwitnessed fall    Pain in joint, multiple sites    Degeneration of cervical intervertebral disc    Unspecified cardiovascular disease    History of pulmonary embolism: June 2014,  Takes Xarelto 07/01/2012    Class: History of   Hemiparesis ([place]) 07/18/2011   Diabetes mellitus type 2 with complications ([place]) 54/09/8117   CAD in native artery 07/18/2011   History of recurrent TIAs 07/18/2011    Lanice Shirts, PT 11/12/2020, 3:34 PM  [facility] [address] [place], [place], [address] Phone: [phone]   Fax:  [phone]  Name: Steve Brown MRN: [id number] Date of Birth: Sep 07, 1940

## 2020-11-15 ENCOUNTER — Ambulatory Visit: Payer: Medicare Other

## 2020-11-15 ENCOUNTER — Other Ambulatory Visit: Payer: Self-pay

## 2020-11-15 DIAGNOSIS — R2681 Unsteadiness on feet: Secondary | ICD-10-CM

## 2020-11-15 DIAGNOSIS — R208 Other disturbances of skin sensation: Secondary | ICD-10-CM | POA: Diagnosis not present

## 2020-11-15 DIAGNOSIS — M6281 Muscle weakness (generalized): Secondary | ICD-10-CM

## 2020-11-15 DIAGNOSIS — R2689 Other abnormalities of gait and mobility: Secondary | ICD-10-CM

## 2020-11-15 DIAGNOSIS — M5432 Sciatica, left side: Secondary | ICD-10-CM | POA: Diagnosis not present

## 2020-11-15 NOTE — Therapy (Signed)
[facility] [address] [place], [place], [address] Phone: [phone]   Fax:  [phone]  Physical Therapy Treatment  Patient Details  Name: Steve Brown MRN: [id number] Date of Birth: September 30, 1940 Referring Provider (PT): Lawerance Cruel, MD   Encounter Date: 11/15/2020   PT End of Session - 11/15/20 1418     Visit Number 12    Number of Visits 17    Date for PT Re-Evaluation 10/15/20    Authorization Type Medicare A&B; 10th visit PN    Authorization Time Period 8/8-10/8    Progress Note Due on Visit 63    PT Start Time 72    PT Stop Time 1145    PT Time Calculation (min) 45 min    Activity Tolerance Patient tolerated treatment well    Behavior During Therapy [facility] for tasks assessed/performed             Past Medical History:  Diagnosis Date   Abnormal prostate biopsy    Anticoagulant long-term use    currently xarelto   BPH with elevated PSA    CKD (chronic kidney disease), stage II    Complication of anesthesia    limted neck rom limited use of left arm due to cva   Coronary artery disease    CARDIOLOGIST-  DR Irish Lack--  2010-- PCI w/ stenting midLAD   DDD (degenerative disc disease), lumbar    Degeneration of cervical intervertebral disc    Depression    Diabetes mellitus without complication (HCC)    Dyspnea on exertion    GERD (gastroesophageal reflux disease)    Hemiparesis due to cerebral infarction    History of cerebrovascular accident (CVA) with residual deficit 2002 and 2003--  hemiparisis both sides   per MRI  anterior left frontal lobe, left para midline pons, and inferior cerebullam bilaterally infarcts   History of pulmonary embolus (PE)    06-30-2012  extensive bilaterally   History of recurrent TIAs    History of syncope    hx multiple pre-syncope and syncopal episodes due to vasovagal, orthostatic hypotension, dehydration   History of TIAs    several since 2002    Hyperlipidemia    Hypertension    Mild atherosclerosis of carotid artery, bilateral    per last duplex 11-04-2014  bilateral ICA 1--39%   Neuropathy    fingers   OSA on CPAP    followed by dr dohmeier--  sev. osa w/ AHI 65.9   Prostate cancer ([place]) dx 2018   Renal insufficiency    S/P coronary artery stent placement 2010   stenting to mid LAD   Simple renal cyst    bilaterally   Stroke [facility])    Trigger finger of both hands 11-17-13   Type 2 diabetes mellitus ([place]) dx 1986   last one A1c 9.2 on 04-26-2016   Unsteady gait    . Hx prior CVA/TIAs;   Vertebral artery occlusion, left    chronic    Past Surgical History:  Procedure Laterality Date   ANTERIOR CERVICAL DECOMP/DISCECTOMY FUSION  2004   C3 -- C6 limited rom   CARDIAC CATHETERIZATION  06-10-2010   dr Irish Lack   wide patent LAD stent, mid lesion at the origin of the septal prior to the previous stent 40-50%/  normal LVF, ef 55%   CARDIOVASCULAR STRESS TEST  10-23-2012  dr Irish Lack   normal nuclear perfusion study w/ no ischemia/  normal LV function and wall motion , ef 65%  CARPAL TUNNEL RELEASE Bilateral    CATARACT EXTRACTION W/ INTRAOCULAR LENS  IMPLANT, BILATERAL     CHOLECYSTECTOMY N/A 11/02/2015   Procedure: LAPAROSCOPIC CHOLECYSTECTOMY WITH INTRAOPERATIVE CHOLANGIOGRAM;  Surgeon: Donnie Mesa, MD;  Location: [facility];  Service: General;  Laterality: N/A;   COLONOSCOPY     CORONARY ANGIOPLASTY WITH STENT PLACEMENT  02/2008   stenting to mid LAD   GOLD SEED IMPLANT N/A 11/15/2016   Procedure: GOLD SEED IMPLANT [place];  Surgeon: Ardis Hughs, MD;  Location: [facility];  Service: Urology;  Laterality: N/A;   IR ANGIO INTRA EXTRACRAN SEL COM CAROTID INNOMINATE BILAT MOD SED  06/13/2018   IR ANGIO VERTEBRAL SEL VERTEBRAL UNI R MOD SED  06/13/2018   IR US GUIDE VASC ACCESS RIGHT  06/13/2018   LEFT HEART CATH AND CORONARY ANGIOGRAPHY N/A 05/25/2017   Procedure: LEFT HEART CATH AND CORONARY  ANGIOGRAPHY;  Surgeon: Jettie Booze, MD;  Location: [facility];  Service: Cardiovascular;  Laterality: N/A;   LEFT HEART CATHETERIZATION WITH CORONARY ANGIOGRAM N/A 04/03/2013   Procedure: LEFT HEART CATHETERIZATION WITH CORONARY ANGIOGRAM;  Surgeon: Jettie Booze, MD;  Location: [facility] CATH LAB;  Service: Cardiovascular;  Laterality: N/A;  patent mLAD stent  w/ mild disease in remainder LAD and its branches;  mod. focal lesion midLCFx- FFR of lesion was negative for ischemia/  normal LVSF, ef 50%   lungs  2005   "fluid pumped off lungs"   NEUROPLASTY / TRANSPOSITION ULNAR NERVE AT ELBOW Right 2004   PROSTATE BIOPSY N/A 08/31/2016   Procedure: PROSTATE  BIOPSY TRANSRECTAL ULTRASONIC PROSTATE (TUBP);  Surgeon: Ardis Hughs, MD;  Location: [facility];  Service: Urology;  Laterality: N/A;   SPACE OAR INSTILLATION N/A 11/15/2016   Procedure: SPACE OAR INSTILLATION;  Surgeon: Ardis Hughs, MD;  Location: [facility];  Service: Urology;  Laterality: N/A;   TRANSTHORACIC ECHOCARDIOGRAM  04/27/2016   severe focal basal LVH, ef 60-65%,  grade 2 diastoilc dysfunction/  mild AR, MR, and TR/  atrial septum lipomatous hypertrophy/  PASP 62GBTD   UMBILICAL HERNIA REPAIR      There were no vitals filed for this visit.   Subjective Assessment - 11/15/20 1104     Subjective Better today than at last session.  Symptoms confined to LS region and L thigh, no radiculopathy    Pertinent History Long-term anticoagulant use, BPH with elevated PSA, CKD, CAD, DDD lumbar, degeneration of cervical intervertebral disc, depression, DM, DOE, hemiparesis due to CVA, multiple CVA, ACDF C3-C6, bilat carpal tunnel release, PE, h/o syncope, h/o TIA, HLD, HTN, neuropathy, OSA, prostate CA, Vertebral artery occlusion, neuroplasty/transposition ulnar nn at elbow    Limitations Walking;Standing    How long can you sit comfortably? unimited    How long can you stand  comfortably? <15 minutes    How long can you walk comfortably? <15 minutes    Patient Stated Goals To relieve LLE pain, improve walking    Pain Onset More than a month ago                               [facility] Adult PT Treatment/Exercise - 11/15/20 0001       Transfers   Transfers Sit to Stand;Stand to Sit    Sit to Stand 6: Modified independent (Device/Increase time)    Stand to Sit 6: Modified independent (Device/Increase time)  Ambulation/Gait   Ambulation/Gait Yes    Ambulation/Gait Assistance 6: Modified independent (Device/Increase time)    Ambulation Distance (Feet) 150 Feet    Assistive device None    Ambulation Surface Level;Indoor      Lumbar Exercises: Stretches   Lower Trunk Rotation Other (comment)    Lower Trunk Rotation Limitations 2 min hold each direction    Hip Flexor Stretch Left;1 rep    Hip Flexor Stretch Limitations 2 min hold    Other Lumbar Stretch Exercise prone quad stretch x2 min      Knee/Hip Exercises: Aerobic   Stepper Scifit seat 18, arms 6 L2 x8 min      Manual Therapy   Manual Therapy Joint mobilization;Soft tissue mobilization    Manual therapy comments STM to B paraspinals and joint mobilizations into flexion and rotation of L4-5 in seated.                       PT Short Term Goals - 10/28/20 1120       PT SHORT TERM GOAL #1   Title Pt will demonstrate independence with updated and revised HEP    Baseline Independent in current HEP    Time 4    Period Weeks    Status Achieved    Target Date 09/16/20      PT SHORT TERM GOAL #2   Title Pt will demonstrate 10 degrees increase in L hamstring ROM and L hip flexor ROM    Baseline 60 deg hamstring, lacking 20 deg to neutral hip extension LLE Thomas test; 08/30/20 L hamstring ROM 90d in supine; 10/05/20 90d in supine    Time 4    Period Weeks    Status Achieved    Target Date 09/16/20      PT SHORT TERM GOAL #3   Title Pt will participate in stair  negotiation assessment with LTG to be set    Baseline TBD; 10/05/20 Able to negotiate a full flight of steps w/o need of rails with step through pattern    Time 4    Period Weeks    Status Achieved    Target Date 09/16/20      PT SHORT TERM GOAL #4   Title Pt will decrease five time sit to stand to </= 15 seconds without UE support    Baseline 18.2 seconds; 10/05/20 10.9s    Time 4    Period Weeks    Status Achieved    Target Date 09/16/20      PT SHORT TERM GOAL #5   Title Pt will increase gait velocity to >/= 2.8 ft/sec without AD and with decreased antalgic gait sequence    Baseline 2.46 ft/sec; 10/05/20 0.76 m/s or 2.49 ft/s; 10/29/18 Gait speed 2.33 ft/s    Time 4    Period Weeks    Status On-going    Target Date 09/16/20               PT Long Term Goals - 10/19/20 1240       PT LONG TERM GOAL #1   Title Pt will be independent with final HEP and walking program at [place] Patient has returnes to [facility] walking and performing chair yoga    Time 4    Period Weeks    Status Achieved    Target Date 11/23/20      PT LONG TERM GOAL #2   Title Pt will negotiate 16 stairs with  one rail, alternating sequence MOD I    Baseline 10/19/20 Patient able to negotiate 16 steps with step throug pattern with B rails    Time 4    Period Weeks    Status Partially Met    Target Date 11/23/20      PT LONG TERM GOAL #3   Title Pt will demonstrate symmetrical hamstring length and hip extension (Thomas test) L to R side    Baseline 10/19/20 B hamstring flexibility esentiall equal, 90d B SLR, Thomas test ROM also equal upon observation    Time 8    Period Weeks    Status Achieved      PT LONG TERM GOAL #4   Title Pt will decrease five time sit to stand to </=13 seconds to indicate decreased falls risk    Baseline 10/19/20 5x STS in 12.6s    Time 4    Period Weeks    Status Achieved    Target Date 11/23/20      PT LONG TERM GOAL #5   Title Pt will increase gait velocity  to >/= 3.0 ft/sec without AD    Baseline 10/19/20 gait velocity 0.68 m/s or 2.23 ft/s    Time 4    Period Weeks    Status Partially Met    Target Date 11/23/20                   Plan - 11/15/20 1418     Clinical Impression Statement Todays session focused on Lumbosacral mobility and STM to B paraspinals.  Attempted to mobilize L4-5 into flexion and rotation and applied PA mobs to lumbar spine in prone, L1-5.  Continued stretching of L hip flexors and added LTR for fascial mobility.  Decreased pain reported following interventions and more upright posture observed    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    Examination-Activity Limitations Bend;Locomotion Level;Stairs;Stand    Examination-Participation Restrictions Community Activity;Other   Fitness   Stability/Clinical Decision Making Evolving/Moderate complexity    Rehab Potential Good    PT Frequency 2x / week    PT Duration 4 weeks    PT Treatment/Interventions ADLs/Self Care Home Management;Aquatic Therapy;DME Instruction;Gait training;Stair training;Functional mobility training;Therapeutic activities;Therapeutic exercise;Balance training;Neuromuscular re-education;Patient/family education;Orthotic Fit/Training;Cryotherapy;Moist Heat;Manual techniques;Passive range of motion;Dry needling;Taping    PT Next Visit Plan L hip flexor stretching, normalize gait pattern, increase lumbosacral mobility    PT Home Exercise Plan [email or web address]    Consulted and Agree with Plan of Care Patient             Patient will benefit from skilled  therapeutic intervention in order to improve the following deficits and impairments:  Decreased activity tolerance, Decreased balance, Cardiopulmonary status limiting activity, Decreased endurance, Decreased range of motion, Decreased strength, Difficulty walking, Impaired sensation, Postural dysfunction, Pain  Visit Diagnosis: Muscle weakness (generalized)  Unsteadiness on feet  Other abnormalities of gait and mobility  Sciatica, left side     Problem List Patient Active Problem List   Diagnosis Date Noted   Syncope 05/26/2020   Back pain 34/19/3790   Acute metabolic encephalopathy 24/09/7351   Lumbar spinal stenosis 08/15/2019   Nonspecific chest pain 08/11/2019  Shortness of breath    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 ([place]) 10/21/2018   Nocturnal hypoxemia 10/21/2018   Radiation therapy complication 49/70/2637   Primary prostate cancer ([place]) 07/31/2017   Anemia 07/31/2017   Vertigo due to cerebrovascular disease 07/31/2017   Poor compliance with CPAP treatment 07/31/2017   Absolute anemia 05/16/2017   Avitaminosis D 05/16/2017   Benign essential HTN 05/16/2017   Benign prostatic hypertrophy without urinary obstruction 05/16/2017   Clinical depression 05/16/2017   CN (constipation) 05/16/2017   Current drug use 05/16/2017   Diabetic neuropathy ([place]) 05/16/2017   Genital herpes 05/16/2017   Infarction of lung due to iatrogenic pulmonary embolism ([place]) 05/16/2017   Sciatica associated with disorder of lumbar spine 05/16/2017   Arteriosclerosis of coronary artery 05/16/2017   Artery disease, cerebral 05/16/2017   Apnea, sleep 05/16/2017   Arthralgia of hip or thigh 05/16/2017   Malignant neoplasm of prostate ([place]) 09/28/2016   Vascular dementia in remission ([place]) 09/28/2016   Remote history of stroke 09/28/2016   Acute kidney injury ([place]) 05/18/2016   Dehydration 05/18/2016   Near syncope 05/18/2016   Orthostatic hypotension 05/18/2016   CKD  (chronic kidney disease), stage II 04/26/2016   UTI (urinary tract infection) 04/26/2016   Encounter for counseling on use of CPAP 11/18/2015   Chronic cholecystitis with calculus 11/02/2015   Stroke, 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[place]) 04/22/2015   TIA (transient ischemic attack) 11/03/2014   OSA on CPAP 05/18/2014   White matter disease 08/14/2013   Abnormal x-ray of temporomandibular joint 05/30/2013   Chronic infection of sinus 05/30/2013   Cough 05/30/2013   Fatigue 05/30/2013   Unsteady gait 05/19/2013   Combined fat and carbohydrate induced hyperlipemia 04/24/2013   Syncope 03/20/2013   Angina pectoris ([place]) 10/08/2012   Hypertension    Stroke ([place])    History of TIAs    Lumbago    Other and unspecified hyperlipidemia    Personal history of unspecified circulatory disease    Unwitnessed fall    Pain in joint, multiple sites    Degeneration of cervical intervertebral disc    Unspecified cardiovascular disease    History of pulmonary embolism: June 2014,  Takes Xarelto 07/01/2012    Class: History of   Hemiparesis ([place]) 07/18/2011   Diabetes mellitus type 2 with complications ([place]) 85/88/5027   CAD in native artery 07/18/2011   History of recurrent TIAs 07/18/2011    Lanice Shirts, PT 11/15/2020, 2:28 PM  [facility] [address] [place], [place], [address] Phone: [phone]   Fax:  [phone]  Name: Brenen Beigel MRN: [id number] Date of Birth: [birth year]

## 2020-11-16 DIAGNOSIS — E7800 Pure hypercholesterolemia, unspecified: Secondary | ICD-10-CM | POA: Diagnosis not present

## 2020-11-16 DIAGNOSIS — E669 Obesity, unspecified: Secondary | ICD-10-CM | POA: Diagnosis not present

## 2020-11-16 DIAGNOSIS — I10 Essential (primary) hypertension: Secondary | ICD-10-CM | POA: Diagnosis not present

## 2020-11-16 DIAGNOSIS — E1165 Type 2 diabetes mellitus with hyperglycemia: Secondary | ICD-10-CM | POA: Diagnosis not present

## 2020-11-16 DIAGNOSIS — N189 Chronic kidney disease, unspecified: Secondary | ICD-10-CM | POA: Diagnosis not present

## 2020-11-16 DIAGNOSIS — I639 Cerebral infarction, unspecified: Secondary | ICD-10-CM | POA: Diagnosis not present

## 2020-11-16 DIAGNOSIS — G609 Hereditary and idiopathic neuropathy, unspecified: Secondary | ICD-10-CM | POA: Diagnosis not present

## 2020-11-16 DIAGNOSIS — C61 Malignant neoplasm of prostate: Secondary | ICD-10-CM | POA: Diagnosis not present

## 2020-11-17 ENCOUNTER — Other Ambulatory Visit: Payer: Self-pay

## 2020-11-17 ENCOUNTER — Ambulatory Visit: Payer: Medicare Other

## 2020-11-17 DIAGNOSIS — R2689 Other abnormalities of gait and mobility: Secondary | ICD-10-CM | POA: Diagnosis not present

## 2020-11-17 DIAGNOSIS — R2681 Unsteadiness on feet: Secondary | ICD-10-CM

## 2020-11-17 DIAGNOSIS — R208 Other disturbances of skin sensation: Secondary | ICD-10-CM

## 2020-11-17 DIAGNOSIS — M6281 Muscle weakness (generalized): Secondary | ICD-10-CM | POA: Diagnosis not present

## 2020-11-17 DIAGNOSIS — M5432 Sciatica, left side: Secondary | ICD-10-CM

## 2020-11-17 NOTE — Therapy (Signed)
[facility] [address] [place], [place], [address] Phone: [phone]   Fax:  [phone]  Physical Therapy Treatment  Patient Details  Name: Steve Brown MRN: [id number] Date of Birth: 04-16-1940 Referring Provider (PT): Lawerance Cruel, MD   Encounter Date: 11/17/2020   PT End of Session - 11/17/20 1153     Visit Number 13    Number of Visits 17    Date for PT Re-Evaluation 10/15/20    Authorization Type Medicare A&B; 10th visit PN    Authorization Time Period 8/8-10/8    Progress Note Due on Visit 44    PT Start Time 1145    PT Stop Time 1230    PT Time Calculation (min) 45 min    Activity Tolerance Patient tolerated treatment well    Behavior During Therapy [facility] for tasks assessed/performed             Past Medical History:  Diagnosis Date   Abnormal prostate biopsy    Anticoagulant long-term use    currently xarelto   BPH with elevated PSA    CKD (chronic kidney disease), stage II    Complication of anesthesia    limted neck rom limited use of left arm due to cva   Coronary artery disease    CARDIOLOGIST-  DR Irish Lack--  2010-- PCI w/ stenting midLAD   DDD (degenerative disc disease), lumbar    Degeneration of cervical intervertebral disc    Depression    Diabetes mellitus without complication (HCC)    Dyspnea on exertion    GERD (gastroesophageal reflux disease)    Hemiparesis due to cerebral infarction    History of cerebrovascular accident (CVA) with residual deficit 2002 and 2003--  hemiparisis both sides   per MRI  anterior left frontal lobe, left para midline pons, and inferior cerebullam bilaterally infarcts   History of pulmonary embolus (PE)    06-30-2012  extensive bilaterally   History of recurrent TIAs    History of syncope    hx multiple pre-syncope and syncopal episodes due to vasovagal, orthostatic hypotension, dehydration   History of TIAs    several since 2002    Hyperlipidemia    Hypertension    Mild atherosclerosis of carotid artery, bilateral    per last duplex 11-04-2014  bilateral ICA 1--39%   Neuropathy    fingers   OSA on CPAP    followed by dr dohmeier--  sev. osa w/ AHI 65.9   Prostate cancer ([place]) dx 2018   Renal insufficiency    S/P coronary artery stent placement 2010   stenting to mid LAD   Simple renal cyst    bilaterally   Stroke [facility])    Trigger finger of both hands 11-17-13   Type 2 diabetes mellitus ([place]) dx 1986   last one A1c 9.2 on 04-26-2016   Unsteady gait    . Hx prior CVA/TIAs;   Vertebral artery occlusion, left    chronic    Past Surgical History:  Procedure Laterality Date   ANTERIOR CERVICAL DECOMP/DISCECTOMY FUSION  2004   C3 -- C6 limited rom   CARDIAC CATHETERIZATION  06-10-2010   dr Irish Lack   wide patent LAD stent, mid lesion at the origin of the septal prior to the previous stent 40-50%/  normal LVF, ef 55%   CARDIOVASCULAR STRESS TEST  10-23-2012  dr Irish Lack   normal nuclear perfusion study w/ no ischemia/  normal LV function and wall motion , ef 65%  CARPAL TUNNEL RELEASE Bilateral    CATARACT EXTRACTION W/ INTRAOCULAR LENS  IMPLANT, BILATERAL     CHOLECYSTECTOMY N/A 11/02/2015   Procedure: LAPAROSCOPIC CHOLECYSTECTOMY WITH INTRAOPERATIVE CHOLANGIOGRAM;  Surgeon: Donnie Mesa, MD;  Location: [place];  Service: General;  Laterality: N/A;   COLONOSCOPY     CORONARY ANGIOPLASTY WITH STENT PLACEMENT  02/2008   stenting to mid LAD   GOLD SEED IMPLANT N/A 11/15/2016   Procedure: GOLD SEED IMPLANT [place];  Surgeon: Ardis Hughs, MD;  Location: [facility];  Service: Urology;  Laterality: N/A;   IR ANGIO INTRA EXTRACRAN SEL COM CAROTID INNOMINATE BILAT MOD SED  06/13/2018   IR ANGIO VERTEBRAL SEL VERTEBRAL UNI R MOD SED  06/13/2018   IR US GUIDE VASC ACCESS RIGHT  06/13/2018   LEFT HEART CATH AND CORONARY ANGIOGRAPHY N/A 05/25/2017   Procedure: LEFT HEART CATH AND CORONARY  ANGIOGRAPHY;  Surgeon: Jettie Booze, MD;  Location: [place] CV [facility];  Service: Cardiovascular;  Laterality: N/A;   LEFT HEART CATHETERIZATION WITH CORONARY ANGIOGRAM N/A 04/03/2013   Procedure: LEFT HEART CATHETERIZATION WITH CORONARY ANGIOGRAM;  Surgeon: Jettie Booze, MD;  Location: [facility] CATH [facility];  Service: Cardiovascular;  Laterality: N/A;  patent mLAD stent  w/ mild disease in remainder LAD and its branches;  mod. focal lesion midLCFx- FFR of lesion was negative for ischemia/  normal LVSF, ef 50%   lungs  2005   "fluid pumped off lungs"   NEUROPLASTY / TRANSPOSITION ULNAR NERVE AT ELBOW Right 2004   PROSTATE BIOPSY N/A 08/31/2016   Procedure: PROSTATE  BIOPSY TRANSRECTAL ULTRASONIC PROSTATE (TUBP);  Surgeon: Ardis Hughs, MD;  Location: [facility];  Service: Urology;  Laterality: N/A;   SPACE OAR INSTILLATION N/A 11/15/2016   Procedure: SPACE OAR INSTILLATION;  Surgeon: Ardis Hughs, MD;  Location: [facility];  Service: Urology;  Laterality: N/A;   TRANSTHORACIC ECHOCARDIOGRAM  04/27/2016   severe focal basal LVH, ef 60-65%,  grade 2 diastoilc dysfunction/  mild AR, MR, and TR/  atrial septum lipomatous hypertrophy/  PASP [id number]   UMBILICAL HERNIA REPAIR      There were no vitals filed for this visit.   Subjective Assessment - 11/17/20 1148     Subjective Reports continued L thigh pain/symptoms.  Unable to identify any significant relief.    Pertinent History Long-term anticoagulant use, BPH with elevated PSA, CKD, CAD, DDD lumbar, degeneration of cervical intervertebral disc, depression, DM, DOE, hemiparesis due to CVA, multiple CVA, ACDF C3-C6, bilat carpal tunnel release, PE, h/o syncope, h/o TIA, HLD, HTN, neuropathy, OSA, prostate CA, Vertebral artery occlusion, neuroplasty/transposition ulnar nn at elbow    Limitations Walking;Standing    How long can you sit comfortably? unimited    How long can you stand comfortably? <15  minutes    How long can you walk comfortably? <15 minutes    Patient Stated Goals To relieve LLE pain, improve walking    Pain Onset More than a month ago                               [facility] Adult PT Treatment/Exercise - 11/17/20 0001       Transfers   Transfers Sit to Stand;Stand to Sit    Sit to Stand 6: Modified independent (Device/Increase time)    Stand to Sit 6: Modified independent (Device/Increase time)      Ambulation/Gait  Ambulation/Gait Yes    Ambulation/Gait Assistance 6: Modified independent (Device/Increase time)    Ambulation Distance (Feet) 150 Feet    Assistive device None    Ambulation Surface Level;Indoor      Lumbar Exercises: Stretches   Lower Trunk Rotation Other (comment)    Lower Trunk Rotation Limitations 2 min hold each direction      Knee/Hip Exercises: Aerobic   Stepper Scifit seat 18, arms 6 L2 x8 min      Manual Therapy   Manual Therapy Joint mobilization;Soft tissue mobilization    Manual therapy comments STM to B paraspinals and joint mobilizations into flexion and rotation of L4-5 in seated. PA mobs to L5-1, grade 3 5x ea.  LTR B to facilitate rotation at L1-5.  PROM into flexion in supine PT assisted over physioball                       PT Short Term Goals - 10/28/20 1120       PT SHORT TERM GOAL #1   Title Pt will demonstrate independence with updated and revised HEP    Baseline Independent in current HEP    Time 4    Period Weeks    Status Achieved    Target Date 09/16/20      PT SHORT TERM GOAL #2   Title Pt will demonstrate 10 degrees increase in L hamstring ROM and L hip flexor ROM    Baseline 60 deg hamstring, lacking 20 deg to neutral hip extension LLE Thomas test; 08/30/20 L hamstring ROM 90d in supine; 10/05/20 90d in supine    Time 4    Period Weeks    Status Achieved    Target Date 09/16/20      PT SHORT TERM GOAL #3   Title Pt will participate in stair negotiation assessment with LTG  to be set    Baseline TBD; 10/05/20 Able to negotiate a full flight of steps w/o need of rails with step through pattern    Time 4    Period Weeks    Status Achieved    Target Date 09/16/20      PT SHORT TERM GOAL #4   Title Pt will decrease five time sit to stand to </= 15 seconds without UE support    Baseline 18.2 seconds; 10/05/20 10.9s    Time 4    Period Weeks    Status Achieved    Target Date 09/16/20      PT SHORT TERM GOAL #5   Title Pt will increase gait velocity to >/= 2.8 ft/sec without AD and with decreased antalgic gait sequence    Baseline 2.46 ft/sec; 10/05/20 0.76 m/s or 2.49 ft/s; 10/29/18 Gait speed 2.33 ft/s    Time 4    Period Weeks    Status On-going    Target Date 09/16/20               PT Long Term Goals - 10/19/20 1240       PT LONG TERM GOAL #1   Title Pt will be independent with final HEP and walking program at [employer]    Baseline Patient has returnes to [employer] walking and performing chair yoga    Time 4    Period Weeks    Status Achieved    Target Date 11/23/20      PT LONG TERM GOAL #2   Title Pt will negotiate 16 stairs with one rail, alternating sequence MOD I      Baseline 10/19/20 Patient able to negotiate 16 steps with step throug pattern with B rails    Time 4    Period Weeks    Status Partially Met    Target Date 11/23/20      PT LONG TERM GOAL #3   Title Pt will demonstrate symmetrical hamstring length and hip extension (Thomas test) L to R side    Baseline 10/19/20 B hamstring flexibility esentiall equal, 90d B SLR, Thomas test ROM also equal upon observation    Time 8    Period Weeks    Status Achieved      PT LONG TERM GOAL #4   Title Pt will decrease five time sit to stand to </=13 seconds to indicate decreased falls risk    Baseline 10/19/20 5x STS in 12.6s    Time 4    Period Weeks    Status Achieved    Target Date 11/23/20      PT LONG TERM GOAL #5   Title Pt will increase gait velocity to >/= 3.0 ft/sec without AD     Baseline 10/19/20 gait velocity 0.68 m/s or 2.23 ft/s    Time 4    Period Weeks    Status Partially Met    Target Date 11/23/20                    Patient will benefit from skilled therapeutic intervention in order to improve the following deficits and impairments:     Visit Diagnosis: Muscle weakness (generalized)  Unsteadiness on feet  Other disturbances of skin sensation  Sciatica, left side     Problem List Patient Active Problem List   Diagnosis Date Noted   Syncope 05/26/2020   Back pain 09/98/3382   Acute metabolic encephalopathy 50/53/9767   Lumbar spinal stenosis 08/15/2019   Nonspecific chest pain 08/11/2019   Shortness of breath    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 ([place]) 10/21/2018   Nocturnal hypoxemia 10/21/2018   Radiation therapy complication 34/19/3790   Primary prostate cancer ([place]) 07/31/2017   Anemia 07/31/2017   Vertigo due to cerebrovascular disease 07/31/2017   Poor compliance with CPAP treatment 07/31/2017   Absolute anemia 05/16/2017   Avitaminosis D 05/16/2017   Benign essential HTN 05/16/2017   Benign prostatic hypertrophy without urinary obstruction 05/16/2017   Clinical depression 05/16/2017   CN (constipation) 05/16/2017   Current drug use 05/16/2017   Diabetic neuropathy ([place]) 05/16/2017   Genital herpes 05/16/2017   Infarction of lung due to iatrogenic pulmonary embolism ([place]) 05/16/2017   Sciatica associated with disorder of lumbar spine 05/16/2017   Arteriosclerosis of coronary artery 05/16/2017   Artery disease, cerebral 05/16/2017   Apnea, sleep 05/16/2017   Arthralgia of hip or thigh 05/16/2017   Malignant neoplasm of prostate ([place]) 09/28/2016   Vascular dementia in remission ([place]) 09/28/2016   Remote history of stroke 09/28/2016   Acute kidney injury ([place]) 05/18/2016   Dehydration 05/18/2016   Near syncope 05/18/2016   Orthostatic hypotension 05/18/2016   CKD (chronic kidney disease), stage  II 04/26/2016   UTI (urinary tract infection) 04/26/2016   Encounter for counseling on use of CPAP 11/18/2015   Chronic cholecystitis with calculus 11/02/2015   Stroke, 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[place]) 04/22/2015   TIA (transient ischemic attack) 11/03/2014   OSA on CPAP 05/18/2014   White matter disease 08/14/2013   Abnormal x-ray of temporomandibular joint 05/30/2013   Chronic infection of sinus 05/30/2013   Cough 05/30/2013   Fatigue 05/30/2013  Unsteady gait 05/19/2013   Combined fat and carbohydrate induced hyperlipemia 04/24/2013   Syncope 03/20/2013   Angina pectoris (HCC) 10/08/2012   Hypertension    Stroke (HCC)    History of TIAs    Lumbago    Other and unspecified hyperlipidemia    Personal history of unspecified circulatory disease    Unwitnessed fall    Pain in joint, multiple sites    Degeneration of cervical intervertebral disc    Unspecified cardiovascular disease    History of pulmonary embolism: June 2014,  Takes Xarelto 07/01/2012    Class: History of   Hemiparesis (HCC) 07/18/2011   Diabetes mellitus type 2 with complications (HCC) 07/18/2011   CAD in native artery 07/18/2011   History of recurrent TIAs 07/18/2011     M , PT 11/17/2020, 2:03 PM  [facility] [address] [place], [place], [address] Phone: [phone]   Fax:  [phone]  Name: Steve Brown MRN: [id number] Date of Birth: 05/29/1940    

## 2020-11-20 ENCOUNTER — Emergency Department (HOSPITAL_COMMUNITY): Payer: Medicare Other

## 2020-11-20 ENCOUNTER — Ambulatory Visit
Admission: EM | Admit: 2020-11-20 | Discharge: 2020-11-20 | Disposition: A | Discharge status: Other Acute Inpatient Hospital | Payer: Medicare Other

## 2020-11-20 ENCOUNTER — Other Ambulatory Visit: Payer: Self-pay

## 2020-11-20 ENCOUNTER — Encounter (HOSPITAL_COMMUNITY): Payer: Self-pay | Admitting: Emergency Medicine

## 2020-11-20 ENCOUNTER — Encounter: Payer: Self-pay | Admitting: Emergency Medicine

## 2020-11-20 ENCOUNTER — Emergency Department (HOSPITAL_COMMUNITY)
Admission: EM | Admit: 2020-11-20 | Discharge: 2020-11-20 | Disposition: A | Discharge status: Home / Self Care | Payer: Medicare Other | Attending: Emergency Medicine | Admitting: Emergency Medicine

## 2020-11-20 DIAGNOSIS — Z794 Long term (current) use of insulin: Secondary | ICD-10-CM | POA: Diagnosis not present

## 2020-11-20 DIAGNOSIS — D170 Benign lipomatous neoplasm of skin and subcutaneous tissue of head, face and neck: Secondary | ICD-10-CM | POA: Diagnosis not present

## 2020-11-20 DIAGNOSIS — Z8546 Personal history of malignant neoplasm of prostate: Secondary | ICD-10-CM | POA: Diagnosis not present

## 2020-11-20 DIAGNOSIS — D631 Anemia in chronic kidney disease: Secondary | ICD-10-CM | POA: Diagnosis not present

## 2020-11-20 DIAGNOSIS — H538 Other visual disturbances: Secondary | ICD-10-CM | POA: Insufficient documentation

## 2020-11-20 DIAGNOSIS — I2510 Atherosclerotic heart disease of native coronary artery without angina pectoris: Secondary | ICD-10-CM | POA: Insufficient documentation

## 2020-11-20 DIAGNOSIS — E1165 Type 2 diabetes mellitus with hyperglycemia: Secondary | ICD-10-CM | POA: Insufficient documentation

## 2020-11-20 DIAGNOSIS — Z955 Presence of coronary angioplasty implant and graft: Secondary | ICD-10-CM | POA: Insufficient documentation

## 2020-11-20 DIAGNOSIS — G609 Hereditary and idiopathic neuropathy, unspecified: Secondary | ICD-10-CM | POA: Insufficient documentation

## 2020-11-20 DIAGNOSIS — Z79899 Other long term (current) drug therapy: Secondary | ICD-10-CM | POA: Diagnosis not present

## 2020-11-20 DIAGNOSIS — E1140 Type 2 diabetes mellitus with diabetic neuropathy, unspecified: Secondary | ICD-10-CM | POA: Insufficient documentation

## 2020-11-20 DIAGNOSIS — N182 Chronic kidney disease, stage 2 (mild): Secondary | ICD-10-CM | POA: Insufficient documentation

## 2020-11-20 DIAGNOSIS — E1122 Type 2 diabetes mellitus with diabetic chronic kidney disease: Secondary | ICD-10-CM | POA: Diagnosis not present

## 2020-11-20 DIAGNOSIS — Z7901 Long term (current) use of anticoagulants: Secondary | ICD-10-CM | POA: Insufficient documentation

## 2020-11-20 DIAGNOSIS — Z8673 Personal history of transient ischemic attack (TIA), and cerebral infarction without residual deficits: Secondary | ICD-10-CM | POA: Diagnosis not present

## 2020-11-20 DIAGNOSIS — Z7984 Long term (current) use of oral hypoglycemic drugs: Secondary | ICD-10-CM | POA: Diagnosis not present

## 2020-11-20 DIAGNOSIS — E669 Obesity, unspecified: Secondary | ICD-10-CM | POA: Insufficient documentation

## 2020-11-20 DIAGNOSIS — F0150 Vascular dementia without behavioral disturbance: Secondary | ICD-10-CM | POA: Insufficient documentation

## 2020-11-20 DIAGNOSIS — I129 Hypertensive chronic kidney disease with stage 1 through stage 4 chronic kidney disease, or unspecified chronic kidney disease: Secondary | ICD-10-CM | POA: Insufficient documentation

## 2020-11-20 DIAGNOSIS — R42 Dizziness and giddiness: Secondary | ICD-10-CM | POA: Insufficient documentation

## 2020-11-20 DIAGNOSIS — G629 Polyneuropathy, unspecified: Secondary | ICD-10-CM | POA: Diagnosis not present

## 2020-11-20 DIAGNOSIS — J323 Chronic sphenoidal sinusitis: Secondary | ICD-10-CM | POA: Diagnosis not present

## 2020-11-20 DIAGNOSIS — E7800 Pure hypercholesterolemia, unspecified: Secondary | ICD-10-CM | POA: Insufficient documentation

## 2020-11-20 DIAGNOSIS — I639 Cerebral infarction, unspecified: Secondary | ICD-10-CM | POA: Diagnosis not present

## 2020-11-20 LAB — URINALYSIS, ROUTINE W REFLEX MICROSCOPIC
Bacteria, UA: NONE SEEN
Bilirubin Urine: NEGATIVE
Glucose, UA: >=500 mg/dL — AB
Hgb urine dipstick: NEGATIVE
Ketones, ur: NEGATIVE mg/dL
Leukocytes,Ua: NEGATIVE
Nitrite: NEGATIVE
Protein, ur: NEGATIVE mg/dL
Specific Gravity, Urine: 1.017 (ref 1.005–1.030)
pH: 6 (ref 5.0–8.0)

## 2020-11-20 LAB — COMPREHENSIVE METABOLIC PANEL
ALT: 19 U/L (ref 0–44)
AST: 20 U/L (ref 15–41)
Albumin: 3.5 g/dL (ref 3.5–5.0)
Alkaline Phosphatase: 90 U/L (ref 38–126)
Anion gap: 3 — ABNORMAL LOW (ref 5–15)
BUN: 20 mg/dL (ref 8–23)
CO2: 29 mmol/L (ref 22–32)
Calcium: 8.8 mg/dL — ABNORMAL LOW (ref 8.9–10.3)
Chloride: 107 mmol/L (ref 98–111)
Creatinine, Ser: 1.58 mg/dL — ABNORMAL HIGH (ref 0.61–1.24)
GFR, Estimated: 44 mL/min — ABNORMAL LOW (ref >60)
Glucose, Bld: 89 mg/dL (ref 70–99)
Potassium: 4 mmol/L (ref 3.5–5.1)
Sodium: 139 mmol/L (ref 135–145)
Total Bilirubin: 0.6 mg/dL (ref 0.3–1.2)
Total Protein: 6.7 g/dL (ref 6.5–8.1)

## 2020-11-20 LAB — CBC WITH DIFFERENTIAL/PLATELET
Abs Immature Granulocytes: 0.01 10*3/uL (ref 0.00–0.07)
Basophils Absolute: 0 10*3/uL (ref 0.0–0.1)
Basophils Relative: 1 %
Eosinophils Absolute: 0 10*3/uL (ref 0.0–0.5)
Eosinophils Relative: 1 %
HCT: 36.5 % — ABNORMAL LOW (ref 39.0–52.0)
Hemoglobin: 12.5 g/dL — ABNORMAL LOW (ref 13.0–17.0)
Immature Granulocytes: 0 %
Lymphocytes Relative: 38 %
Lymphs Abs: 1.7 10*3/uL (ref 0.7–4.0)
MCH: 31.8 pg (ref 26.0–34.0)
MCHC: 34.2 g/dL (ref 30.0–36.0)
MCV: 92.9 fL (ref 80.0–100.0)
Monocytes Absolute: 0.6 10*3/uL (ref 0.1–1.0)
Monocytes Relative: 13 %
Neutro Abs: 2.1 10*3/uL (ref 1.7–7.7)
Neutrophils Relative %: 47 %
Platelets: 169 10*3/uL (ref 150–400)
RBC: 3.93 MIL/uL — ABNORMAL LOW (ref 4.22–5.81)
RDW: 13.4 % (ref 11.5–15.5)
WBC: 4.4 10*3/uL (ref 4.0–10.5)
nRBC: 0 % (ref 0.0–0.2)

## 2020-11-20 LAB — CBG MONITORING, ED: Glucose-Capillary: 90 mg/dL (ref 70–99)

## 2020-11-20 NOTE — ED Provider Notes (Signed)
[facility] DEPT Provider Note   CSN: [id number] Arrival date & time: 11/20/20  1549     History Chief Complaint  Patient presents with   Dizziness   Blurred Vision    Steve Brown is a 80 y.o. male with history of diabetes, stroke, TIA, hypertension, and recurrent UTIs who presents the emergency department with bilaterally blurred vision that began yesterday evening.  Patient states he was watching TV when his vision became acutely blurred which was severe to the point that he was unable to see anything a few feet in front of him.  He was able to get to his bed and went to sleep.  He does not recall similar symptoms with his previous strokes or TIAs.  He was seen evaluated urgent care today for symptoms and was sent to the emergency department for further evaluation.  He states he is currently feeling improved with regard to his blurred vision however his vision is still blurry mildly.  He denies any weakness/numbness to his upper or lower extremities, trouble walking, loss of consciousness, trouble talking, chest pain, shortness of breath, cough, congestion, fever, chills.  No urinary symptoms at this time.  He did state that he was feeling general myalgias and chills last night.  No sick contacts or recent travel. Did recently start [facility] [place] for DM.    Dizziness     Past Medical History:  Diagnosis Date   Abnormal prostate biopsy    Anticoagulant long-term use    currently xarelto   BPH with elevated PSA    CKD (chronic kidney disease), stage II    Complication of anesthesia    limted neck rom limited use of left arm due to cva   Coronary artery disease    CARDIOLOGIST-  DR Irish Lack--  2010-- PCI w/ stenting midLAD   DDD (degenerative disc disease), lumbar    Degeneration of cervical intervertebral disc    Depression    Diabetes mellitus without complication (HCC)    Dyspnea on exertion    GERD (gastroesophageal reflux disease)     Hemiparesis due to cerebral infarction    History of cerebrovascular accident (CVA) with residual deficit 2002 and 2003--  hemiparisis both sides   per MRI  anterior left frontal lobe, left para midline pons, and inferior cerebullam bilaterally infarcts   History of pulmonary embolus (PE)    06-30-2012  extensive bilaterally   History of recurrent TIAs    History of syncope    hx multiple pre-syncope and syncopal episodes due to vasovagal, orthostatic hypotension, dehydration   History of TIAs    several since 2002   Hyperlipidemia    Hypertension    Mild atherosclerosis of carotid artery, bilateral    per last duplex 11-04-2014  bilateral ICA 1--39%   Neuropathy    fingers   OSA on CPAP    followed by dr dohmeier--  sev. osa w/ AHI 65.9   Prostate cancer ([place]) dx 2018   Renal insufficiency    S/P coronary artery stent placement 2010   stenting to mid LAD   Simple renal cyst    bilaterally   Stroke [facility])    Trigger finger of both hands 11-17-13   Type 2 diabetes mellitus ([place]) dx 1986   last one A1c 9.2 on 04-26-2016   Unsteady gait    . Hx prior CVA/TIAs;   Vertebral artery occlusion, left    chronic    Patient Active Problem List   Diagnosis Date Noted  Hereditary and idiopathic neuropathy, unspecified 11/20/2020   Hyperglycemia due to type 2 diabetes mellitus ([place]) 11/20/2020   Obesity 11/20/2020   Pure hypercholesterolemia 11/20/2020   Syncope 05/26/2020   Back pain 46/96/2952   Acute metabolic encephalopathy 84/13/2440   Lumbar spinal stenosis 08/15/2019   Nonspecific chest pain 08/11/2019   Shortness of breath    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 ([place]) 10/21/2018   Nocturnal hypoxemia 10/21/2018   Radiation therapy complication 11/05/2534   Primary prostate cancer ([place]) 07/31/2017   Anemia 07/31/2017   Vertigo due to cerebrovascular disease 07/31/2017   Poor compliance with CPAP treatment 07/31/2017   Absolute anemia 05/16/2017    Avitaminosis D 05/16/2017   Benign essential HTN 05/16/2017   Benign prostatic hypertrophy without urinary obstruction 05/16/2017   Clinical depression 05/16/2017   CN (constipation) 05/16/2017   Current drug use 05/16/2017   Diabetic neuropathy ([place]) 05/16/2017   Genital herpes 05/16/2017   Infarction of lung due to iatrogenic pulmonary embolism ([place]) 05/16/2017   Sciatica associated with disorder of lumbar spine 05/16/2017   Arteriosclerosis of coronary artery 05/16/2017   Artery disease, cerebral 05/16/2017   Apnea, sleep 05/16/2017   Arthralgia of hip or thigh 05/16/2017   Malignant neoplasm of prostate ([place]) 09/28/2016   Vascular dementia in remission ([place]) 09/28/2016   Remote history of stroke 09/28/2016   Acute kidney injury ([place]) 05/18/2016   Dehydration 05/18/2016   Near syncope 05/18/2016   Orthostatic hypotension 05/18/2016   CKD (chronic kidney disease), stage II 04/26/2016   UTI (urinary tract infection) 04/26/2016   Encounter for counseling on use of CPAP 11/18/2015   Chronic cholecystitis with calculus 11/02/2015   Stroke, 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[place]) 04/22/2015   TIA (transient ischemic attack) 11/03/2014   OSA on CPAP 05/18/2014   White matter disease 08/14/2013   Abnormal x-ray of temporomandibular joint 05/30/2013   Chronic infection of sinus 05/30/2013   Cough 05/30/2013   Fatigue 05/30/2013   Unsteady gait 05/19/2013   Combined fat and carbohydrate induced hyperlipemia 04/24/2013   Syncope 03/20/2013   Angina pectoris ([place]) 10/08/2012   Hypertension    Stroke ([place])    History of TIAs    Lumbago    Other and unspecified hyperlipidemia    Personal history of unspecified circulatory disease    Unwitnessed fall    Pain in joint, multiple sites    Degeneration of cervical intervertebral disc    Unspecified cardiovascular disease    History of pulmonary embolism: June 2014,  Takes Xarelto 07/01/2012    Class: History of   Hemiparesis ([place]) 07/18/2011    Diabetes mellitus type 2 with complications ([place]) 64/40/3474   CAD in native artery 07/18/2011   History of recurrent TIAs 07/18/2011    Past Surgical History:  Procedure Laterality Date   ANTERIOR CERVICAL DECOMP/DISCECTOMY FUSION  2004   C3 -- C6 limited rom   CARDIAC CATHETERIZATION  06-10-2010   dr Irish Lack   wide patent LAD stent, mid lesion at the origin of the septal prior to the previous stent 40-50%/  normal LVF, ef 55%   CARDIOVASCULAR STRESS TEST  10-23-2012  dr Irish Lack   normal nuclear perfusion study w/ no ischemia/  normal LV function and wall motion , ef 65%   CARPAL TUNNEL RELEASE Bilateral    CATARACT EXTRACTION W/ INTRAOCULAR LENS  IMPLANT, BILATERAL     CHOLECYSTECTOMY N/A 11/02/2015   Procedure: LAPAROSCOPIC CHOLECYSTECTOMY WITH INTRAOPERATIVE CHOLANGIOGRAM;  Surgeon: Donnie Mesa, MD;  Location: [facility];  Service: General;  Laterality: N/A;   COLONOSCOPY     CORONARY ANGIOPLASTY WITH STENT PLACEMENT  02/2008   stenting to mid LAD   GOLD SEED IMPLANT N/A 11/15/2016   Procedure: GOLD SEED IMPLANT TIMES THREE;  Surgeon: Ardis Hughs, MD;  Location: [facility];  Service: Urology;  Laterality: N/A;   IR ANGIO INTRA EXTRACRAN SEL COM CAROTID INNOMINATE BILAT MOD SED  06/13/2018   IR ANGIO VERTEBRAL SEL VERTEBRAL UNI R MOD SED  06/13/2018   IR US GUIDE VASC ACCESS RIGHT  06/13/2018   LEFT HEART CATH AND CORONARY ANGIOGRAPHY N/A 05/25/2017   Procedure: LEFT HEART CATH AND CORONARY ANGIOGRAPHY;  Surgeon: Jettie Booze, MD;  Location: [facility];  Service: Cardiovascular;  Laterality: N/A;   LEFT HEART CATHETERIZATION WITH CORONARY ANGIOGRAM N/A 04/03/2013   Procedure: LEFT HEART CATHETERIZATION WITH CORONARY ANGIOGRAM;  Surgeon: Jettie Booze, MD;  Location: [facility] CATH LAB;  Service: Cardiovascular;  Laterality: N/A;  patent mLAD stent  w/ mild disease in remainder LAD and its branches;  mod. focal lesion midLCFx- FFR of lesion was negative for  ischemia/  normal LVSF, ef 50%   lungs  2005   "fluid pumped off lungs"   NEUROPLASTY / TRANSPOSITION ULNAR NERVE AT ELBOW Right 2004   PROSTATE BIOPSY N/A 08/31/2016   Procedure: PROSTATE  BIOPSY TRANSRECTAL ULTRASONIC PROSTATE (TUBP);  Surgeon: Ardis Hughs, MD;  Location: [facility];  Service: Urology;  Laterality: N/A;   SPACE OAR INSTILLATION N/A 11/15/2016   Procedure: SPACE OAR INSTILLATION;  Surgeon: Ardis Hughs, MD;  Location: [facility];  Service: Urology;  Laterality: N/A;   TRANSTHORACIC ECHOCARDIOGRAM  04/27/2016   severe focal basal LVH, ef 60-65%,  grade 2 diastoilc dysfunction/  mild AR, MR, and TR/  atrial septum lipomatous hypertrophy/  PASP [id number]   UMBILICAL HERNIA REPAIR         Family History  Problem Relation Age of Onset   Aneurysm Mother    Cancer Father        unknown either pancreatic or prostate   Stroke Brother    Dementia Sister    Heart attack Neg Hx     Social History   Tobacco Use   Smoking status: Never   Smokeless tobacco: Never  Vaping Use   Vaping Use: Never used  Substance Use Topics   Alcohol use: Not Currently   Drug use: Never    Home Medications Prior to Admission medications   Medication Sig Start Date End Date Taking? Authorizing Provider  acetaminophen (TYLENOL) 500 MG tablet Take 500 mg by mouth every 6 (six) hours as needed (pain).    [provider]  albuterol (VENTOLIN HFA) 108 (90 Base) MCG/ACT inhaler Inhale 1-2 puffs into the lungs every 6 (six) hours as needed for wheezing or shortness of breath. 09/06/20   Ward, Lenise Arena, PA-C  bisacodyl (DULCOLAX) 10 MG suppository Place 1 suppository (10 mg total) rectally daily as needed for moderate constipation. 05/31/20   Antonieta Pert, MD  diclofenac Sodium (VOLTAREN) 1 % GEL Apply 4 g topically 4 (four) times daily. 09/03/19   Deno Etienne, DO  fluticasone (FLONASE) 50 MCG/ACT nasal spray Place 1 spray into both nostrils daily as  needed for allergies. 06/13/20   Medina-Vargas, Monina C, NP  furosemide (LASIX) 20 MG tablet Take 1-2 tablets (20-40 mg total) by mouth daily as needed for fluid. 06/13/20   Medina-Vargas, Monina C, NP  hydrochlorothiazide (MICROZIDE) 12.5  MG capsule Take 1 capsule (12.5 mg total) by mouth daily. 06/13/20   Medina-Vargas, Monina C, NP  insulin aspart (NOVOLOG) 100 UNIT/ML FlexPen Inject 5 Units into the skin 3 (three) times daily with meals. 06/13/20   Medina-Vargas, Monina C, NP  losartan (COZAAR) 100 MG tablet Take 1 tablet (100 mg total) by mouth daily. 06/13/20   Medina-Vargas, Monina C, NP  methocarbamol (ROBAXIN) 500 MG tablet Take 1 tablet (500 mg total) by mouth every 8 (eight) hours as needed for muscle spasms. 06/13/20   Medina-Vargas, Monina C, NP  modafinil (PROVIGIL) 100 MG tablet Take 1 tablet (100 mg total) by mouth daily. 08/23/20   Ward Givens, NP  oxyCODONE (OXY IR/ROXICODONE) 5 MG immediate release tablet Take 1 tablet (5 mg total) by mouth every 6 (six) hours as needed for up to 6 doses for severe pain. 06/08/20   Medina-Vargas, Monina C, NP  pantoprazole (PROTONIX) 40 MG tablet Take 1 tablet (40 mg total) by mouth every morning. 06/13/20   Medina-Vargas, Monina C, NP  polyethylene glycol (MIRALAX / GLYCOLAX) 17 g packet Take 17 g by mouth daily. 05/31/20   Antonieta Pert, MD  pregabalin (LYRICA) 300 MG capsule Take 300 mg by mouth 2 (two) times daily. 04/26/20   [provider]  rosuvastatin (CRESTOR) 10 MG tablet Take 1 tablet (10 mg total) by mouth daily. 06/13/20   Medina-Vargas, Monina C, NP  sitaGLIPtin (JANUVIA) 50 MG tablet Take 1 tablet (50 mg total) by mouth daily. 06/13/20   Medina-Vargas, Monina C, NP  TRESIBA FLEXTOUCH 100 UNIT/ML FlexTouch Pen Inject 34 Units into the skin daily. 06/13/20   Medina-Vargas, Monina C, NP  XARELTO 20 MG TABS tablet Take 1 tablet (20 mg total) by mouth daily. 06/13/20   Medina-Vargas, Monina C, NP    Allergies    Other, Phenergan [promethazine],  Iohexol, Iohexol, Phenergan [promethazine], and Tramadol  Review of Systems   Review of Systems  Genitourinary:  Negative for dysuria, frequency and hematuria.  Neurological:  Positive for dizziness.  All other systems reviewed and are negative.  Physical Exam Updated Vital Signs BP (!) 158/78   Pulse (!) 55   Temp 98.2 F (36.8 C) (Oral)   Resp 16   Ht 5\' 5"  (1.651 m)   Wt 93.9 kg   SpO2 99%   BMI 34.45 kg/m   Physical Exam Vitals and nursing note reviewed.  Constitutional:      General: He is not in acute distress.    Appearance: Normal appearance.  HENT:     Head: Normocephalic and atraumatic.  Eyes:     General:        Right eye: No discharge.        Left eye: No discharge.  Cardiovascular:     Comments: Regular rate and rhythm.  S1/S2 are distinct without any evidence of murmur, rubs, or gallops.  Radial pulses are 2+ bilaterally.  Dorsalis pedis pulses are 2+ bilaterally.  No evidence of pedal edema. Pulmonary:     Comments: Clear to auscultation bilaterally.  Normal effort.  No respiratory distress.  No evidence of wheezes, rales, or rhonchi heard throughout. Abdominal:     General: Abdomen is flat. Bowel sounds are normal. There is no distension.     Tenderness: There is no abdominal tenderness. There is no guarding or rebound.  Musculoskeletal:        General: Normal range of motion.     Cervical back: Neck supple.  Skin:  General: Skin is warm and dry.     Findings: No rash.  Neurological:     General: No focal deficit present.     Mental Status: He is alert.     Comments: Cranial nerves II to XII are intact.  5/5 strength to the upper and lower extremities.  Normal sensation to the upper and lower extremities.  No dysdiadochokinesia on rapid altering movements however was performed slow bilaterally. Normal finger to nose. No dysarthria or aphasia.  Psychiatric:        Mood and Affect: Mood normal.        Behavior: Behavior normal.    ED Results /  Procedures / Treatments   Labs (all labs ordered are listed, but only abnormal results are displayed) Labs Reviewed  COMPREHENSIVE METABOLIC PANEL - Abnormal; Notable for the following components:      Result Value   Creatinine, Ser 1.58 (*)    Calcium 8.8 (*)    GFR, Estimated 44 (*)    Anion gap 3 (*)    All other components within normal limits  CBC WITH DIFFERENTIAL/PLATELET - Abnormal; Notable for the following components:   RBC 3.93 (*)    Hemoglobin 12.5 (*)    HCT 36.5 (*)    All other components within normal limits  URINALYSIS, ROUTINE W REFLEX MICROSCOPIC - Abnormal; Notable for the following components:   Color, Urine STRAW (*)    Glucose, UA >=500 (*)    All other components within normal limits  CBG MONITORING, ED    EKG None  Radiology CT Head Wo Contrast  Result Date: 11/20/2020 CLINICAL DATA:  blurred vision EXAM: CT HEAD WITHOUT CONTRAST TECHNIQUE: Contiguous axial images were obtained from the base of the skull through the vertex without intravenous contrast. COMPARISON:  November 03, 2020. FINDINGS: Brain: No evidence of acute infarction, hemorrhage, hydrocephalus, extra-axial collection or mass lesion/mass effect. Periventricular white matter hypodensities consistent with sequela of chronic microvascular ischemic disease. Remote LEFT cerebellar infarction. Vascular: Vascular calcifications of the carotid siphons and ophthalmic arteries. Skull: No acute fracture. Sinuses/Orbits: Revisualization of complete opacification of the RIGHT sphenoid sinus with corresponding osseous changes consistent with chronic sinusitis. There is high density material within the sinus which may reflect inspissated secretions versus chronic fungal colonization. Scattered opacification of ethmoid air cells. Other: LEFT forehead lipoma. IMPRESSION: 1.  No acute intracranial abnormality. 2.  Chronic sphenoid sinusitis. Electronically Signed   By: Valentino Saxon M.D.   On: 11/20/2020 18:30     Procedures Procedures   Medications Ordered in ED Medications - No data to display  ED Course  I have reviewed the triage vital signs and the nursing notes.  Pertinent labs & imaging results that were available during my care of the patient were reviewed by me and considered in my medical decision making (see chart for details).  Clinical Course as of 11/20/20 2126  Sat Nov 20, 2020  1717 I discussed this case with my attending physician who cosigned this note including patient's presenting symptoms, physical exam, and planned diagnostics and interventions. Attending physician stated agreement with plan or made changes to plan which were implemented.   Attending physician assessed patient at bedside.   [CF]    Clinical Course User Index [CF] Cherrie Gauze   MDM Rules/Calculators/A&P                          Steve Brown is a 80 y.o. male who  presents to the emergency department for further evaluation of bilateral blurred vision.  Given that his blurred vision has improved prior to arrival and is bilateral nature have a low suspicion for acute stroke at this time.  His neurological exam is completely normal.  After discussing the case with my attending we will check some basic labs, UA, and CT head.  CBC showed chronic ongoing anemia but was without leukocytosis.  CMP showed chronic ongoing elevated creatinine.  UA was without any signs of infection but did show large amount of glucose.  CT head was negative.  Given the clinical scenario, we will likely have him follow-up with his primary care provider for further evaluation.  His symptoms have improved although still having some mild blurred vision. Patient was hypertensive in the emergency department today.  This could be the cause of his blurred vision.    Final Clinical Impression(s) / ED Diagnoses Final diagnoses:  Blurred vision, bilateral    Rx / DC Orders ED Discharge Orders     None         Cherrie Gauze 11/20/20 2126    Daleen Bo, MD 11/20/20 [phone]

## 2020-11-20 NOTE — ED Provider Notes (Signed)
  Face-to-face evaluation   History: He is here for evaluation of blurred vision and dizziness.  He saw a medical provider at an urgent care today and was sent here for evaluation.  He drove his vehicle to the urgent care, and was sent here by EMS.  He saw his endocrinologist, 4 days ago who prescribed Jardiance which he started yesterday.  Patient reports 20 minutes of near complete vision loss where he could not see anything while he was sitting down yesterday.  This happened about 7 PM.  After about 20 minutes he was able to ambulate, but had to use his hands to help find his way.  Since then he has had gradual improvement of his symptoms, and now has mild bilateral blurred vision.  He reports the vision loss was bilateral.  He denies headache at the time but states that he had some body aches.  He has not had this previously.  He denies chest pain, shortness of breath, focal weakness or paresthesia.  Physical exam: Elderly male who is alert and cooperative.  He is somewhat overweight.  No dysarthria or aphasia.  No altered sensation of face, arms or legs.  No facial asymmetry.  No ataxia.  Medical screening examination/treatment/procedure(s) were conducted as a shared visit with non-physician practitioner(s) and myself.  I personally evaluated the patient during the encounter    Daleen Bo, MD 11/20/20 2338

## 2020-11-20 NOTE — ED Triage Notes (Signed)
Patient presents from urgent care with blurred vision and dizziness. He reports symptoms starting last night between 4-6. While they are still present, they have since improved. He did start Jardiance yesterday. Stroke screen neg per EMS.   HX stroke (2003) TIA   EMS vitals: 146/77 BP 60 HR 99% SPO2 138 CBG

## 2020-11-20 NOTE — ED Triage Notes (Signed)
Pt was at home last night and started having blurred vision and could not figure out how to use his phone and was not able to see going down the hallway. Made his way down the hallway and laid down and woke up today with blurred vision and drove here and even said I need to get off the road due to vision changes. Hx of TIA. No tingling or numbness no slurred speach

## 2020-11-20 NOTE — ED Notes (Signed)
Visual Acuity: Bilateral 20/50

## 2020-11-20 NOTE — Discharge Instructions (Addendum)
Your work-up today was essentially negative.  I have a low suspicion that this was caused by a stroke.  Your blood pressure was elevated in the emergency department today.  I would like for you to follow-up with your primary care doctor for further evaluation of your blurry vision and elevated blood pressures.  Please return to the emergency department if you experience worsening blurred vision, weakness/numbness to your upper or lower extremities, trouble walking, trouble talking, loss of consciousness, or any other concerns you might have.

## 2020-11-20 NOTE — ED Provider Notes (Signed)
[facility]    CSN: [id number] Arrival date & time: 11/20/20  1424      History   Chief Complaint Chief Complaint  Patient presents with   Blurred Vision    HPI Steve Brown is a 80 y.o. male.   Patient is here today for evaluation of blurred vision that started suddenly around 5 PM last night. He states that he also had some body aches and tingling all over, had difficulty finding his daughter on his phone to call. He states blurry vision has continued today. He had difficulty seeing to walk down hallway and admits to feeling he needed to get off highway when driving earlier today. He does report that he took first dose of Jardiance yesterday, not sure if this is related. He does have history of stroke and last TIA was diagnosed only 3 weeks ago. He denies any chest pain, shortness of breath or headaches.   The history is provided by the patient.   Past Medical History:  Diagnosis Date   Abnormal prostate biopsy    Anticoagulant long-term use    currently xarelto   BPH with elevated PSA    CKD (chronic kidney disease), stage II    Complication of anesthesia    limted neck rom limited use of left arm due to cva   Coronary artery disease    CARDIOLOGIST-  DR Irish Lack--  2010-- PCI w/ stenting midLAD   DDD (degenerative disc disease), lumbar    Degeneration of cervical intervertebral disc    Depression    Diabetes mellitus without complication (HCC)    Dyspnea on exertion    GERD (gastroesophageal reflux disease)    Hemiparesis due to cerebral infarction    History of cerebrovascular accident (CVA) with residual deficit 2002 and 2003--  hemiparisis both sides   per MRI  anterior left frontal lobe, left para midline pons, and inferior cerebullam bilaterally infarcts   History of pulmonary embolus (PE)    06-30-2012  extensive bilaterally   History of recurrent TIAs    History of syncope    hx multiple pre-syncope and syncopal episodes due to vasovagal,  orthostatic hypotension, dehydration   History of TIAs    several since 2002   Hyperlipidemia    Hypertension    Mild atherosclerosis of carotid artery, bilateral    per last duplex 11-04-2014  bilateral ICA 1--39%   Neuropathy    fingers   OSA on CPAP    followed by dr dohmeier--  sev. osa w/ AHI 65.9   Prostate cancer ([place]) dx 2018   Renal insufficiency    S/P coronary artery stent placement 2010   stenting to mid LAD   Simple renal cyst    bilaterally   Stroke [facility])    Trigger finger of both hands 11-17-13   Type 2 diabetes mellitus ([place]) dx 1986   last one A1c 9.2 on 04-26-2016   Unsteady gait    . Hx prior CVA/TIAs;   Vertebral artery occlusion, left    chronic    Patient Active Problem List   Diagnosis Date Noted   Syncope 05/26/2020   Back pain 65/46/5035   Acute metabolic encephalopathy 46/56/8127   Lumbar spinal stenosis 08/15/2019   Nonspecific chest pain 08/11/2019   Shortness of breath    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 ([place]) 10/21/2018   Nocturnal hypoxemia 10/21/2018   Radiation therapy complication 51/70/0174   Primary prostate cancer ([place]) 07/31/2017   Anemia 07/31/2017   Vertigo due  to cerebrovascular disease 07/31/2017   Poor compliance with CPAP treatment 07/31/2017   Absolute anemia 05/16/2017   Avitaminosis D 05/16/2017   Benign essential HTN 05/16/2017   Benign prostatic hypertrophy without urinary obstruction 05/16/2017   Clinical depression 05/16/2017   CN (constipation) 05/16/2017   Current drug use 05/16/2017   Diabetic neuropathy ([place]) 05/16/2017   Genital herpes 05/16/2017   Infarction of lung due to iatrogenic pulmonary embolism ([place]) 05/16/2017   Sciatica associated with disorder of lumbar spine 05/16/2017   Arteriosclerosis of coronary artery 05/16/2017   Artery disease, cerebral 05/16/2017   Apnea, sleep 05/16/2017   Arthralgia of hip or thigh 05/16/2017   Malignant neoplasm of prostate ([place]) 09/28/2016    Vascular dementia in remission ([place]) 09/28/2016   Remote history of stroke 09/28/2016   Acute kidney injury ([place]) 05/18/2016   Dehydration 05/18/2016   Near syncope 05/18/2016   Orthostatic hypotension 05/18/2016   CKD (chronic kidney disease), stage II 04/26/2016   UTI (urinary tract infection) 04/26/2016   Encounter for counseling on use of CPAP 11/18/2015   Chronic cholecystitis with calculus 11/02/2015   Stroke, 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[place]) 04/22/2015   TIA (transient ischemic attack) 11/03/2014   OSA on CPAP 05/18/2014   White matter disease 08/14/2013   Abnormal x-ray of temporomandibular joint 05/30/2013   Chronic infection of sinus 05/30/2013   Cough 05/30/2013   Fatigue 05/30/2013   Unsteady gait 05/19/2013   Combined fat and carbohydrate induced hyperlipemia 04/24/2013   Syncope 03/20/2013   Angina pectoris ([place]) 10/08/2012   Hypertension    Stroke ([place])    History of TIAs    Lumbago    Other and unspecified hyperlipidemia    Personal history of unspecified circulatory disease    Unwitnessed fall    Pain in joint, multiple sites    Degeneration of cervical intervertebral disc    Unspecified cardiovascular disease    History of pulmonary embolism: June 2014,  Takes Xarelto 07/01/2012    Class: History of   Hemiparesis ([place]) 07/18/2011   Diabetes mellitus type 2 with complications ([place]) 24/40/1027   CAD in native artery 07/18/2011   History of recurrent TIAs 07/18/2011    Past Surgical History:  Procedure Laterality Date   ANTERIOR CERVICAL DECOMP/DISCECTOMY FUSION  2004   C3 -- C6 limited rom   CARDIAC CATHETERIZATION  06-10-2010   dr Irish Lack   wide patent LAD stent, mid lesion at the origin of the septal prior to the previous stent 40-50%/  normal LVF, ef 55%   CARDIOVASCULAR STRESS TEST  10-23-2012  dr Irish Lack   normal nuclear perfusion study w/ no ischemia/  normal LV function and wall motion , ef 65%   CARPAL TUNNEL RELEASE Bilateral    CATARACT EXTRACTION W/  INTRAOCULAR LENS  IMPLANT, BILATERAL     CHOLECYSTECTOMY N/A 11/02/2015   Procedure: LAPAROSCOPIC CHOLECYSTECTOMY WITH INTRAOPERATIVE CHOLANGIOGRAM;  Surgeon: Donnie Mesa, MD;  Location: [facility];  Service: General;  Laterality: N/A;   COLONOSCOPY     CORONARY ANGIOPLASTY WITH STENT PLACEMENT  02/2008   stenting to mid LAD   GOLD SEED IMPLANT N/A 11/15/2016   Procedure: [place];  Surgeon: Ardis Hughs, MD;  Location: [facility];  Service: Urology;  Laterality: N/A;   IR ANGIO INTRA EXTRACRAN SEL COM CAROTID INNOMINATE BILAT MOD SED  06/13/2018   IR ANGIO VERTEBRAL SEL VERTEBRAL UNI R MOD SED  06/13/2018   IR US GUIDE VASC ACCESS RIGHT  06/13/2018  LEFT HEART CATH AND CORONARY ANGIOGRAPHY N/A 05/25/2017   Procedure: LEFT HEART CATH AND CORONARY ANGIOGRAPHY;  Surgeon: Jettie Booze, MD;  Location: [facility];  Service: Cardiovascular;  Laterality: N/A;   LEFT HEART CATHETERIZATION WITH CORONARY ANGIOGRAM N/A 04/03/2013   Procedure: LEFT HEART CATHETERIZATION WITH CORONARY ANGIOGRAM;  Surgeon: Jettie Booze, MD;  Location: [facility] C[facility];  Service: Cardiovascular;  Laterality: N/A;  patent mLAD stent  w/ mild disease in remainder LAD and its branches;  mod. focal lesion midLCFx- FFR of lesion was negative for ischemia/  normal LVSF, ef 50%   lungs  2005   "fluid pumped off lungs"   NEUROPLASTY / TRANSPOSITION ULNAR NERVE AT ELBOW Right 2004   PROSTATE BIOPSY N/A 08/31/2016   Procedure: PROSTATE  BIOPSY TRANSRECTAL ULTRASONIC PROSTATE (TUBP);  Surgeon: Ardis Hughs, MD;  Location: [facility];  Service: Urology;  Laterality: N/A;   SPACE OAR INSTILLATION N/A 11/15/2016   Procedure: SPACE OAR INSTILLATION;  Surgeon: Ardis Hughs, MD;  Location: [facility];  Service: Urology;  Laterality: N/A;   TRANSTHORACIC ECHOCARDIOGRAM  04/27/2016   severe focal basal LVH, ef 60-65%,  grade 2 diastoilc dysfunction/   mild AR, MR, and TR/  atrial septum lipomatous hypertrophy/  PASP [id number]   UMBILICAL HERNIA REPAIR         Home Medications    Prior to Admission medications   Medication Sig Start Date End Date Taking? Authorizing Provider  acetaminophen (TYLENOL) 500 MG tablet Take 500 mg by mouth every 6 (six) hours as needed (pain).    [provider]  albuterol (VENTOLIN HFA) 108 (90 Base) MCG/ACT inhaler Inhale 1-2 puffs into the lungs every 6 (six) hours as needed for wheezing or shortness of breath. 09/06/20   Ward, Lenise Arena, PA-C  bisacodyl (DULCOLAX) 10 MG suppository Place 1 suppository (10 mg total) rectally daily as needed for moderate constipation. 05/31/20   Antonieta Pert, MD  diclofenac Sodium (VOLTAREN) 1 % GEL Apply 4 g topically 4 (four) times daily. 09/03/19   Deno Etienne, DO  fluticasone (FLONASE) 50 MCG/ACT nasal spray Place 1 spray into both nostrils daily as needed for allergies. 06/13/20   Medina-Vargas, Monina C, NP  furosemide (LASIX) 20 MG tablet Take 1-2 tablets (20-40 mg total) by mouth daily as needed for fluid. 06/13/20   Medina-Vargas, Monina C, NP  hydrochlorothiazide (MICROZIDE) 12.5 MG capsule Take 1 capsule (12.5 mg total) by mouth daily. 06/13/20   Medina-Vargas, Monina C, NP  insulin aspart (NOVOLOG) 100 UNIT/ML FlexPen Inject 5 Units into the skin 3 (three) times daily with meals. 06/13/20   Medina-Vargas, Monina C, NP  losartan (COZAAR) 100 MG tablet Take 1 tablet (100 mg total) by mouth daily. 06/13/20   Medina-Vargas, Monina C, NP  methocarbamol (ROBAXIN) 500 MG tablet Take 1 tablet (500 mg total) by mouth every 8 (eight) hours as needed for muscle spasms. 06/13/20   Medina-Vargas, Monina C, NP  modafinil (PROVIGIL) 100 MG tablet Take 1 tablet (100 mg total) by mouth daily. 08/23/20   Ward Givens, NP  oxyCODONE (OXY IR/ROXICODONE) 5 MG immediate release tablet Take 1 tablet (5 mg total) by mouth every 6 (six) hours as needed for up to 6 doses for severe pain. 06/08/20    Medina-Vargas, Monina C, NP  pantoprazole (PROTONIX) 40 MG tablet Take 1 tablet (40 mg total) by mouth every morning. 06/13/20   Medina-Vargas, Monina C, NP  polyethylene glycol (MIRALAX / GLYCOLAX)  17 g packet Take 17 g by mouth daily. 05/31/20   Antonieta Pert, MD  pregabalin (LYRICA) 300 MG capsule Take 300 mg by mouth 2 (two) times daily. 04/26/20   [provider]  rosuvastatin (CRESTOR) 10 MG tablet Take 1 tablet (10 mg total) by mouth daily. 06/13/20   Medina-Vargas, Monina C, NP  sitaGLIPtin (JANUVIA) 50 MG tablet Take 1 tablet (50 mg total) by mouth daily. 06/13/20   Medina-Vargas, Monina C, NP  TRESIBA FLEXTOUCH 100 UNIT/ML FlexTouch Pen Inject 34 Units into the skin daily. 06/13/20   Medina-Vargas, Monina C, NP  XARELTO 20 MG TABS tablet Take 1 tablet (20 mg total) by mouth daily. 06/13/20   Medina-Vargas, Senaida Lange, NP    Family History Family History  Problem Relation Age of Onset   Aneurysm Mother    Cancer Father        unknown either pancreatic or prostate   Stroke Brother    Dementia Sister    Heart attack Neg Hx     Social History Social History   Tobacco Use   Smoking status: Never   Smokeless tobacco: Never  Vaping Use   Vaping Use: Never used  Substance Use Topics   Alcohol use: Not Currently   Drug use: Never     Allergies   Other, Phenergan [promethazine], Iohexol, Iohexol, Phenergan [promethazine], and Tramadol   Review of Systems Review of Systems  Constitutional:  Negative for chills and fever.  Eyes:  Positive for visual disturbance. Negative for discharge and redness.  Respiratory:  Negative for shortness of breath and wheezing.   Cardiovascular:  Negative for chest pain.  Gastrointestinal:  Negative for vomiting.  Musculoskeletal:  Positive for myalgias.  Skin:  Positive for color change and wound.  Neurological:  Positive for numbness. Negative for facial asymmetry, speech difficulty and headaches.  Psychiatric/Behavioral:  Positive for  confusion.     Physical Exam Triage Vital Signs ED Triage Vitals  Enc Vitals Group     BP 11/20/20 1503 139/73     Pulse Rate 11/20/20 1503 78     Resp 11/20/20 1503 16     Temp 11/20/20 1503 98.7 F (37.1 C)     Temp Source 11/20/20 1503 Oral     SpO2 11/20/20 1503 98 %     Weight --      Height --      Head Circumference --      Peak Flow --      Pain Score 11/20/20 1506 5     Pain Loc --      Pain Edu? --      Excl. in [place]? --    No data found.  Updated Vital Signs BP 139/73 (BP Location: Right Arm)   Pulse 78   Temp 98.7 F (37.1 C) (Oral)   Resp 16   SpO2 98%    Physical Exam Vitals and nursing note reviewed.  Constitutional:      General: He is not in acute distress.    Appearance: Normal appearance. He is not ill-appearing.  HENT:     Head: Normocephalic and atraumatic.  Eyes:     Extraocular Movements: Extraocular movements intact.     Conjunctiva/sclera: Conjunctivae normal.     Pupils: Pupils are equal, round, and reactive to light.  Cardiovascular:     Rate and Rhythm: Normal rate and regular rhythm.     Pulses: Normal pulses.     Heart sounds: No murmur heard. Pulmonary:  Effort: Pulmonary effort is normal. No respiratory distress.     Breath sounds: Normal breath sounds. No wheezing, rhonchi or rales.  Skin:    General: Skin is warm and dry.  Neurological:     Mental Status: He is alert and oriented to person, place, and time.     Cranial Nerves: No facial asymmetry.     Motor: No tremor or pronator drift.     Comments: No facial droop, normal speech  Psychiatric:        Mood and Affect: Mood normal.        Behavior: Behavior normal.        Thought Content: Thought content normal.     UC Treatments / Results  Labs (all labs ordered are listed, but only abnormal results are displayed) Labs Reviewed - No data to display  EKG   Radiology No results found.  Procedures Procedures (including critical care time)  Medications  Ordered in UC Medications - No data to display  Initial Impression / Assessment and Plan / UC Course  I have reviewed the triage vital signs and the nursing notes.  Pertinent labs & imaging results that were available during my care of the patient were reviewed by me and considered in my medical decision making (see chart for details).   Given patient's history recommended further evaluation in the ED. Discussed possibility of TIA vs stroke vs medication reaction. Ultimately he will need stroke clearance. Patient drove himself here, discussed he would need EMS transport which patient is reluctantly agreeable to.   Final Clinical Impressions(s) / UC Diagnoses   Final diagnoses:  Blurred vision  History of stroke   Discharge Instructions   None    ED Prescriptions   None    PDMP not reviewed this encounter.   Francene Finders, PA-C 11/20/20 1525

## 2020-11-20 NOTE — ED Notes (Signed)
Pt was advised to got to ER and was transported EMS

## 2020-11-22 ENCOUNTER — Other Ambulatory Visit: Payer: Self-pay

## 2020-11-22 ENCOUNTER — Ambulatory Visit: Payer: Medicare Other

## 2020-11-22 DIAGNOSIS — G453 Amaurosis fugax: Secondary | ICD-10-CM | POA: Diagnosis not present

## 2020-11-22 DIAGNOSIS — H5310 Unspecified subjective visual disturbances: Secondary | ICD-10-CM | POA: Diagnosis not present

## 2020-11-22 DIAGNOSIS — D649 Anemia, unspecified: Secondary | ICD-10-CM | POA: Diagnosis not present

## 2020-11-22 DIAGNOSIS — N400 Enlarged prostate without lower urinary tract symptoms: Secondary | ICD-10-CM | POA: Diagnosis not present

## 2020-11-22 DIAGNOSIS — M5432 Sciatica, left side: Secondary | ICD-10-CM | POA: Diagnosis not present

## 2020-11-22 DIAGNOSIS — M6281 Muscle weakness (generalized): Secondary | ICD-10-CM

## 2020-11-22 DIAGNOSIS — I10 Essential (primary) hypertension: Secondary | ICD-10-CM | POA: Diagnosis not present

## 2020-11-22 DIAGNOSIS — R2681 Unsteadiness on feet: Secondary | ICD-10-CM | POA: Diagnosis not present

## 2020-11-22 DIAGNOSIS — R2689 Other abnormalities of gait and mobility: Secondary | ICD-10-CM | POA: Diagnosis not present

## 2020-11-22 DIAGNOSIS — N183 Chronic kidney disease, stage 3 unspecified: Secondary | ICD-10-CM | POA: Diagnosis not present

## 2020-11-22 DIAGNOSIS — K219 Gastro-esophageal reflux disease without esophagitis: Secondary | ICD-10-CM | POA: Diagnosis not present

## 2020-11-22 DIAGNOSIS — R208 Other disturbances of skin sensation: Secondary | ICD-10-CM

## 2020-11-22 DIAGNOSIS — F329 Major depressive disorder, single episode, unspecified: Secondary | ICD-10-CM | POA: Diagnosis not present

## 2020-11-22 DIAGNOSIS — E782 Mixed hyperlipidemia: Secondary | ICD-10-CM | POA: Diagnosis not present

## 2020-11-22 DIAGNOSIS — E1140 Type 2 diabetes mellitus with diabetic neuropathy, unspecified: Secondary | ICD-10-CM | POA: Diagnosis not present

## 2020-11-22 DIAGNOSIS — E11319 Type 2 diabetes mellitus with unspecified diabetic retinopathy without macular edema: Secondary | ICD-10-CM | POA: Diagnosis not present

## 2020-11-22 DIAGNOSIS — H532 Diplopia: Secondary | ICD-10-CM | POA: Diagnosis not present

## 2020-11-22 NOTE — Therapy (Signed)
[facility] [address] [place], [place], [address] Phone: [phone]   Fax:  [phone]  Physical Therapy Treatment  Patient Details  Name: Steve Brown MRN: [id number] Date of Birth: October 03, 1940 Referring Provider (PT): Lawerance Cruel, MD   Encounter Date: 11/22/2020   PT End of Session - 11/22/20 1201     Visit Number 14    Number of Visits 17    Date for PT Re-Evaluation 10/15/20    Authorization Type Medicare A&B; 10th visit PN    Authorization Time Period 8/8-10/8    Progress Note Due on Visit 44    PT Start Time 1155    PT Stop Time 1230    PT Time Calculation (min) 35 min    Equipment Utilized During Treatment Gait belt    Activity Tolerance Patient tolerated treatment well    Behavior During Therapy WFL for tasks assessed/performed             Past Medical History:  Diagnosis Date   Abnormal prostate biopsy    Anticoagulant long-term use    currently xarelto   BPH with elevated PSA    CKD (chronic kidney disease), stage II    Complication of anesthesia    limted neck rom limited use of left arm due to cva   Coronary artery disease    CARDIOLOGIST-  DR Irish Lack--  2010-- PCI w/ stenting midLAD   DDD (degenerative disc disease), lumbar    Degeneration of cervical intervertebral disc    Depression    Diabetes mellitus without complication (HCC)    Dyspnea on exertion    GERD (gastroesophageal reflux disease)    Hemiparesis due to cerebral infarction    History of cerebrovascular accident (CVA) with residual deficit 2002 and 2003--  hemiparisis both sides   per MRI  anterior left frontal lobe, left para midline pons, and inferior cerebullam bilaterally infarcts   History of pulmonary embolus (PE)    06-30-2012  extensive bilaterally   History of recurrent TIAs    History of syncope    hx multiple pre-syncope and syncopal episodes due to vasovagal, orthostatic hypotension, dehydration    History of TIAs    several since 2002   Hyperlipidemia    Hypertension    Mild atherosclerosis of carotid artery, bilateral    per last duplex 11-04-2014  bilateral ICA 1--39%   Neuropathy    fingers   OSA on CPAP    followed by dr dohmeier--  sev. osa w/ AHI 65.9   Prostate cancer ([facility]) dx 2018   Renal insufficiency    S/P coronary artery stent placement 2010   stenting to mid LAD   Simple renal cyst    bilaterally   Stroke [facility])    Trigger finger of both hands 11-17-13   Type 2 diabetes mellitus ([place]) dx 1986   last one A1c 9.2 on 04-26-2016   Unsteady gait    . Hx prior CVA/TIAs;   Vertebral artery occlusion, left    chronic    Past Surgical History:  Procedure Laterality Date   ANTERIOR CERVICAL DECOMP/DISCECTOMY FUSION  2004   C3 -- C6 limited rom   CARDIAC CATHETERIZATION  06-10-2010   dr Irish Lack   wide patent LAD stent, mid lesion at the origin of the septal prior to the previous stent 40-50%/  normal LVF, ef 55%   CARDIOVASCULAR STRESS TEST  10-23-2012  dr Irish Lack   normal nuclear perfusion study w/ no ischemia/  normal LV function and wall motion , ef 65%   CARPAL TUNNEL RELEASE Bilateral    CATARACT EXTRACTION W/ INTRAOCULAR LENS  IMPLANT, BILATERAL     CHOLECYSTECTOMY N/A 11/02/2015   Procedure: LAPAROSCOPIC CHOLECYSTECTOMY WITH INTRAOPERATIVE CHOLANGIOGRAM;  Surgeon: Donnie Mesa, MD;  Location: [facility];  Service: General;  Laterality: N/A;   COLONOSCOPY     CORONARY ANGIOPLASTY WITH STENT PLACEMENT  02/2008   stenting to mid LAD   GOLD SEED IMPLANT N/A 11/15/2016   Procedure: GOLD SEED IMPLANT [place];  Surgeon: Ardis Hughs, MD;  Location: [facility];  Service: Urology;  Laterality: N/A;   IR ANGIO INTRA EXTRACRAN SEL COM CAROTID INNOMINATE BILAT MOD SED  06/13/2018   IR ANGIO VERTEBRAL SEL VERTEBRAL UNI R MOD SED  06/13/2018   IR US GUIDE VASC ACCESS RIGHT  06/13/2018   LEFT HEART CATH AND CORONARY ANGIOGRAPHY N/A 05/25/2017    Procedure: LEFT HEART CATH AND CORONARY ANGIOGRAPHY;  Surgeon: Jettie Booze, MD;  Location: [facility];  Service: Cardiovascular;  Laterality: N/A;   LEFT HEART CATHETERIZATION WITH CORONARY ANGIOGRAM N/A 04/03/2013   Procedure: LEFT HEART CATHETERIZATION WITH CORONARY ANGIOGRAM;  Surgeon: Jettie Booze, MD;  Location: [facility] CATH LAB;  Service: Cardiovascular;  Laterality: N/A;  patent mLAD stent  w/ mild disease in remainder LAD and its branches;  mod. focal lesion midLCFx- FFR of lesion was negative for ischemia/  normal LVSF, ef 50%   lungs  2005   "fluid pumped off lungs"   NEUROPLASTY / TRANSPOSITION ULNAR NERVE AT ELBOW Right 2004   PROSTATE BIOPSY N/A 08/31/2016   Procedure: PROSTATE  BIOPSY TRANSRECTAL ULTRASONIC PROSTATE (TUBP);  Surgeon: Ardis Hughs, MD;  Location: [facility];  Service: Urology;  Laterality: N/A;   SPACE OAR INSTILLATION N/A 11/15/2016   Procedure: SPACE OAR INSTILLATION;  Surgeon: Ardis Hughs, MD;  Location: [facility];  Service: Urology;  Laterality: N/A;   TRANSTHORACIC ECHOCARDIOGRAM  04/27/2016   severe focal basal LVH, ef 60-65%,  grade 2 diastoilc dysfunction/  mild AR, MR, and TR/  atrial septum lipomatous hypertrophy/  PASP [id number]   UMBILICAL HERNIA REPAIR      There were no vitals filed for this visit.   Subjective Assessment - 11/22/20 1157     Subjective Reports continued low back pain and now noting diffuse BLE stmptoms in anterior thighs.  Reports having an episode of vision loss over the weekend requiring a ER visit.  Visit scheduled with eye doctor afer todays session.  Vision improved but not yet at baseline    Pertinent History Long-term anticoagulant use, BPH with elevated PSA, CKD, CAD, DDD lumbar, degeneration of cervical intervertebral disc, depression, DM, DOE, hemiparesis due to CVA, multiple CVA, ACDF C3-C6, bilat carpal tunnel release, PE, h/o syncope, h/o TIA, HLD, HTN,  neuropathy, OSA, prostate CA, Vertebral artery occlusion, neuroplasty/transposition ulnar nn at elbow    Limitations Walking;Standing    How long can you sit comfortably? unimited    How long can you stand comfortably? <15 minutes    How long can you walk comfortably? <15 minutes    Patient Stated Goals To relieve LLE pain, improve walking    Currently in Pain? Yes    Pain Score 4     Pain Location Back    Pain Orientation Right;Left    Pain Descriptors / Indicators Aching    Pain Type Chronic pain    Pain Onset More  than a month ago                               [facility] Adult PT Treatment/Exercise - 11/22/20 0001       Transfers   Transfers Sit to Stand;Stand to Sit    Sit to Stand 6: Modified independent (Device/Increase time)    Stand to Sit 6: Modified independent (Device/Increase time)      Ambulation/Gait   Ambulation/Gait Yes    Ambulation/Gait Assistance 6: Modified independent (Device/Increase time)    Ambulation Distance (Feet) 100 Feet    Assistive device None    Ambulation Surface Level;Indoor      Knee/Hip Exercises: Aerobic   Stepper Scifit seat 16, arms 6 L4 x8 min      Manual Therapy   Manual Therapy Joint mobilization    Manual therapy comments Joint mobs to L-spine in prone, PA mobs at L5-3, R rotational mobs at L4-5                       PT Short Term Goals - 10/28/20 1120       PT SHORT TERM GOAL #1   Title Pt will demonstrate independence with updated and revised HEP    Baseline Independent in current HEP    Time 4    Period Weeks    Status Achieved    Target Date 09/16/20      PT SHORT TERM GOAL #2   Title Pt will demonstrate 10 degrees increase in L hamstring ROM and L hip flexor ROM    Baseline 60 deg hamstring, lacking 20 deg to neutral hip extension LLE Thomas test; 08/30/20 L hamstring ROM 90d in supine; 10/05/20 90d in supine    Time 4    Period Weeks    Status Achieved    Target Date 09/16/20      PT  SHORT TERM GOAL #3   Title Pt will participate in stair negotiation assessment with LTG to be set    Baseline TBD; 10/05/20 Able to negotiate a full flight of steps w/o need of rails with step through pattern    Time 4    Period Weeks    Status Achieved    Target Date 09/16/20      PT SHORT TERM GOAL #4   Title Pt will decrease five time sit to stand to </= 15 seconds without UE support    Baseline 18.2 seconds; 10/05/20 10.9s    Time 4    Period Weeks    Status Achieved    Target Date 09/16/20      PT SHORT TERM GOAL #5   Title Pt will increase gait velocity to >/= 2.8 ft/sec without AD and with decreased antalgic gait sequence    Baseline 2.46 ft/sec; 10/05/20 0.76 m/s or 2.49 ft/s; 10/29/18 Gait speed 2.33 ft/s    Time 4    Period Weeks    Status On-going    Target Date 09/16/20               PT Long Term Goals - 10/19/20 1240       PT LONG TERM GOAL #1   Title Pt will be independent with final HEP and walking program at [place] Patient has returnes to [facility] walking and performing chair yoga    Time 4    Period Weeks    Status Achieved  Target Date 11/23/20      PT LONG TERM GOAL #2   Title Pt will negotiate 16 stairs with one rail, alternating sequence MOD I    Baseline 10/19/20 Patient able to negotiate 16 steps with step throug pattern with B rails    Time 4    Period Weeks    Status Partially Met    Target Date 11/23/20      PT LONG TERM GOAL #3   Title Pt will demonstrate symmetrical hamstring length and hip extension (Thomas test) L to R side    Baseline 10/19/20 B hamstring flexibility esentiall equal, 90d B SLR, Thomas test ROM also equal upon observation    Time 8    Period Weeks    Status Achieved      PT LONG TERM GOAL #4   Title Pt will decrease five time sit to stand to </=13 seconds to indicate decreased falls risk    Baseline 10/19/20 5x STS in 12.6s    Time 4    Period Weeks    Status Achieved    Target Date 11/23/20      PT  LONG TERM GOAL #5   Title Pt will increase gait velocity to >/= 3.0 ft/sec without AD    Baseline 10/19/20 gait velocity 0.68 m/s or 2.23 ft/s    Time 4    Period Weeks    Status Partially Met    Target Date 11/23/20                   Plan - 11/22/20 1406     Clinical Impression Statement Todays session focused on cause of lumbosacral pain.  Patient showing increased symptoms with seated lumbar flexion, R rotation and R SB.  Manual of PA mobs at L5-3 followed by L rotational mobs at L4-5.  Improved mobility detected on spring testing suggesting less restrictions at intersegmental junctions.  Less discomfort and imporved function/mobility following intervention    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    Examination-Activity Limitations Bend;Locomotion Level;Stairs;Stand    Examination-Participation Restrictions Community Activity;Other   Fitness   Stability/Clinical Decision Making Evolving/Moderate complexity    Rehab Potential Good    PT Frequency 2x / week    PT Duration 4 weeks    PT Treatment/Interventions ADLs/Self Care Home Management;Aquatic Therapy;DME Instruction;Gait training;Stair training;Functional mobility training;Therapeutic activities;Therapeutic exercise;Balance training;Neuromuscular re-education;Patient/family education;Orthotic Fit/Training;Cryotherapy;Moist Heat;Manual techniques;Passive range of motion;Dry needling;Taping    PT Next Visit Plan increase lumbosacral mobility,manual mobs to reduce pain with R flex, rot and SB    PT Home Exercise Plan [email or web address]     Consulted and Agree with Plan of Care Patient             Patient will benefit from skilled therapeutic intervention in order to improve the following deficits and impairments:  Decreased activity tolerance, Decreased balance, Cardiopulmonary status limiting activity, Decreased endurance, Decreased range of motion, Decreased strength, Difficulty walking, Impaired sensation, Postural dysfunction, Pain  Visit Diagnosis: Muscle weakness (generalized)  Unsteadiness on feet  Other disturbances of skin sensation     Problem List Patient Active Problem List   Diagnosis Date Noted   Hereditary and idiopathic  neuropathy, unspecified 11/20/2020   Hyperglycemia due to type 2 diabetes mellitus ([place]) 11/20/2020   Obesity 11/20/2020   Pure hypercholesterolemia 11/20/2020   Syncope 05/26/2020   Back pain 15/72/6203   Acute metabolic encephalopathy 55/97/4163   Lumbar spinal stenosis 08/15/2019   Nonspecific chest pain 08/11/2019   Shortness of breath    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 ([place]) 10/21/2018   Nocturnal hypoxemia 10/21/2018   Radiation therapy complication 84/53/6468   Primary prostate cancer ([place]) 07/31/2017   Anemia 07/31/2017   Vertigo due to cerebrovascular disease 07/31/2017   Poor compliance with CPAP treatment 07/31/2017   Absolute anemia 05/16/2017   Avitaminosis D 05/16/2017   Benign essential HTN 05/16/2017   Benign prostatic hypertrophy without urinary obstruction 05/16/2017   Clinical depression 05/16/2017   CN (constipation) 05/16/2017   Current drug use 05/16/2017   Diabetic neuropathy ([place]) 05/16/2017   Genital herpes 05/16/2017   Infarction of lung due to iatrogenic pulmonary embolism ([place]) 05/16/2017   Sciatica associated with disorder of lumbar spine 05/16/2017   Arteriosclerosis of coronary artery 05/16/2017   Artery disease, cerebral 05/16/2017   Apnea, sleep 05/16/2017   Arthralgia of hip or thigh 05/16/2017   Malignant  neoplasm of prostate ([place]) 09/28/2016   Vascular dementia in remission ([place]) 09/28/2016   Remote history of stroke 09/28/2016   Acute kidney injury ([place]) 05/18/2016   Dehydration 05/18/2016   Near syncope 05/18/2016   Orthostatic hypotension 05/18/2016   CKD (chronic kidney disease), stage II 04/26/2016   UTI (urinary tract infection) 04/26/2016   Encounter for counseling on use of CPAP 11/18/2015   Chronic cholecystitis with calculus 11/02/2015   Stroke, 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[place]) 04/22/2015   TIA (transient ischemic attack) 11/03/2014   OSA on CPAP 05/18/2014   White matter disease 08/14/2013   Abnormal x-ray of temporomandibular joint 05/30/2013   Chronic infection of sinus 05/30/2013   Cough 05/30/2013   Fatigue 05/30/2013   Unsteady gait 05/19/2013   Combined fat and carbohydrate induced hyperlipemia 04/24/2013   Syncope 03/20/2013   Angina pectoris ([place]) 10/08/2012   Hypertension    Stroke ([place])    History of TIAs    Lumbago    Other and unspecified hyperlipidemia    Personal history of unspecified circulatory disease    Unwitnessed fall    Pain in joint, multiple sites    Degeneration of cervical intervertebral disc    Unspecified cardiovascular disease    History of pulmonary embolism: June 2014,  Takes Xarelto 07/01/2012    Class: History of   Hemiparesis ([place]) 07/18/2011   Diabetes mellitus type 2 with complications ([place]) 03/30/2246   CAD in native artery 07/18/2011   History of recurrent TIAs 07/18/2011    Lanice Shirts, PT 11/22/2020, 2:24 PM  [facility] [address] [place], [place], [address] Phone: [phone]   Fax:  [phone]  Name: Linzy Darling MRN: [id number] Date of Birth: 11-02-1940

## 2020-11-24 ENCOUNTER — Ambulatory Visit: Payer: Medicare Other

## 2020-11-24 ENCOUNTER — Other Ambulatory Visit: Payer: Self-pay

## 2020-11-24 DIAGNOSIS — R208 Other disturbances of skin sensation: Secondary | ICD-10-CM

## 2020-11-24 DIAGNOSIS — M6281 Muscle weakness (generalized): Secondary | ICD-10-CM

## 2020-11-24 DIAGNOSIS — R2681 Unsteadiness on feet: Secondary | ICD-10-CM

## 2020-11-24 DIAGNOSIS — R2689 Other abnormalities of gait and mobility: Secondary | ICD-10-CM | POA: Diagnosis not present

## 2020-11-24 DIAGNOSIS — M5432 Sciatica, left side: Secondary | ICD-10-CM | POA: Diagnosis not present

## 2020-11-24 NOTE — Therapy (Signed)
[facility] [address] [place], [place], [address] Phone: [phone]   Fax:  [phone]  Physical Therapy Treatment  Patient Details  Name: Steve Brown MRN: [id number] Date of Birth: 1940/04/12 Referring Provider (PT): Lawerance Cruel, MD   Encounter Date: 11/24/2020   PT End of Session - 11/24/20 1159     Visit Number 15    Number of Visits 17    Date for PT Re-Evaluation 10/15/20    Authorization Type Medicare A&B; 10th visit PN    Authorization Time Period 10/9-11/26    Progress Note Due on Visit 23    PT Start Time 1145    PT Stop Time 61    PT Time Calculation (min) 45 min    Equipment Utilized During Treatment Gait belt    Activity Tolerance Patient tolerated treatment well    Behavior During Therapy WFL for tasks assessed/performed             Past Medical History:  Diagnosis Date   Abnormal prostate biopsy    Anticoagulant long-term use    currently xarelto   BPH with elevated PSA    CKD (chronic kidney disease), stage II    Complication of anesthesia    limted neck rom limited use of left arm due to cva   Coronary artery disease    CARDIOLOGIST-  DR Irish Lack--  2010-- PCI w/ stenting midLAD   DDD (degenerative disc disease), lumbar    Degeneration of cervical intervertebral disc    Depression    Diabetes mellitus without complication (HCC)    Dyspnea on exertion    GERD (gastroesophageal reflux disease)    Hemiparesis due to cerebral infarction    History of cerebrovascular accident (CVA) with residual deficit 2002 and 2003--  hemiparisis both sides   per MRI  anterior left frontal lobe, left para midline pons, and inferior cerebullam bilaterally infarcts   History of pulmonary embolus (PE)    06-30-2012  extensive bilaterally   History of recurrent TIAs    History of syncope    hx multiple pre-syncope and syncopal episodes due to vasovagal, orthostatic hypotension,  dehydration   History of TIAs    several since 2002   Hyperlipidemia    Hypertension    Mild atherosclerosis of carotid artery, bilateral    per last duplex 11-04-2014  bilateral ICA 1--39%   Neuropathy    fingers   OSA on CPAP    followed by dr dohmeier--  sev. osa w/ AHI 65.9   Prostate cancer ([place]) dx 2018   Renal insufficiency    S/P coronary artery stent placement 2010   stenting to mid LAD   Simple renal cyst    bilaterally   Stroke [facility]    Trigger finger of both hands 11-17-13   Type 2 diabetes mellitus ([place]) dx 1986   last one A1c 9.2 on 04-26-2016   Unsteady gait    . Hx prior CVA/TIAs;   Vertebral artery occlusion, left    chronic    Past Surgical History:  Procedure Laterality Date   ANTERIOR CERVICAL DECOMP/DISCECTOMY FUSION  2004   C3 -- C6 limited rom   CARDIAC CATHETERIZATION  06-10-2010   dr Irish Lack   wide patent LAD stent, mid lesion at the origin of the septal prior to the previous stent 40-50%/  normal LVF, ef 55%   CARDIOVASCULAR STRESS TEST  10-23-2012  dr Irish Lack   normal nuclear perfusion study w/ no ischemia/  normal LV function and wall motion , ef 65%   CARPAL TUNNEL RELEASE Bilateral    CATARACT EXTRACTION W/ INTRAOCULAR LENS  IMPLANT, BILATERAL     CHOLECYSTECTOMY N/A 11/02/2015   Procedure: LAPAROSCOPIC CHOLECYSTECTOMY WITH INTRAOPERATIVE CHOLANGIOGRAM;  Surgeon: Donnie Mesa, MD;  Location: [facility];  Service: General;  Laterality: N/A;   COLONOSCOPY     CORONARY ANGIOPLASTY WITH STENT PLACEMENT  02/2008   stenting to mid LAD   GOLD SEED IMPLANT N/A 11/15/2016   Procedure: GOLD SEED IMPLANT [place];  Surgeon: Ardis Hughs, MD;  Location: [facility];  Service: Urology;  Laterality: N/A;   IR ANGIO INTRA EXTRACRAN SEL COM CAROTID INNOMINATE BILAT MOD SED  06/13/2018   IR ANGIO VERTEBRAL SEL VERTEBRAL UNI R MOD SED  06/13/2018   IR US GUIDE VASC ACCESS RIGHT  06/13/2018   LEFT HEART CATH AND CORONARY ANGIOGRAPHY N/A  05/25/2017   Procedure: LEFT HEART CATH AND CORONARY ANGIOGRAPHY;  Surgeon: Jettie Booze, MD;  Location: [facility];  Service: Cardiovascular;  Laterality: N/A;   LEFT HEART CATHETERIZATION WITH CORONARY ANGIOGRAM N/A 04/03/2013   Procedure: LEFT HEART CATHETERIZATION WITH CORONARY ANGIOGRAM;  Surgeon: Jettie Booze, MD;  Location: [facility] CATH LAB;  Service: Cardiovascular;  Laterality: N/A;  patent mLAD stent  w/ mild disease in remainder LAD and its branches;  mod. focal lesion midLCFx- FFR of lesion was negative for ischemia/  normal LVSF, ef 50%   lungs  2005   "fluid pumped off lungs"   NEUROPLASTY / TRANSPOSITION ULNAR NERVE AT ELBOW Right 2004   PROSTATE BIOPSY N/A 08/31/2016   Procedure: PROSTATE  BIOPSY TRANSRECTAL ULTRASONIC PROSTATE (TUBP);  Surgeon: Ardis Hughs, MD;  Location: [facility];  Service: Urology;  Laterality: N/A;   SPACE OAR INSTILLATION N/A 11/15/2016   Procedure: SPACE OAR INSTILLATION;  Surgeon: Ardis Hughs, MD;  Location[facility];  Service: Urology;  Laterality: N/A;   TRANSTHORACIC ECHOCARDIOGRAM  04/27/2016   severe focal basal LVH, ef 60-65%,  grade 2 diastoilc dysfunction/  mild AR, MR, and TR/  atrial septum lipomatous hypertrophy/  PASP [id number]   UMBILICAL HERNIA REPAIR      There were no vitals filed for this visit.   Subjective Assessment - 11/24/20 1157     Subjective Overall improvement, symptoms centralized to lumbosacral region with occasional anterior thigh symptoms, now alternating and occasional vs constant.  Able to sleep on his back    Pertinent History Long-term anticoagulant use, BPH with elevated PSA, CKD, CAD, DDD lumbar, degeneration of cervical intervertebral disc, depression, DM, DOE, hemiparesis due to CVA, multiple CVA, ACDF C3-C6, bilat carpal tunnel release, PE, h/o syncope, h/o TIA, HLD, HTN, neuropathy, OSA, prostate CA, Vertebral artery occlusion, neuroplasty/transposition  ulnar nn at elbow    Limitations Walking;Standing    How long can you sit comfortably? unimited    How long can you stand comfortably? <15 minutes    How long can you walk comfortably? <15 minutes    Patient Stated Goals To relieve LLE pain, improve walking    Pain Onset More than a month ago                               [facility] Adult PT Treatment/Exercise - 11/24/20 0001       Transfers   Transfers Sit to Stand;Stand to Sit    Sit to Stand  6: Modified independent (Device/Increase time)    Stand to Sit 6: Modified independent (Device/Increase time)      Ambulation/Gait   Ambulation/Gait Yes    Ambulation/Gait Assistance 6: Modified independent (Device/Increase time)    Ambulation Distance (Feet) 100 Feet    Assistive device None    Ambulation Surface Level;Indoor      Knee/Hip Exercises: Aerobic   Stepper Scifit seat 16, arms 6 L4 x8 min      Manual Therapy   Manual Therapy Joint mobilization    Manual therapy comments Joint mobs to L-spine in prone, PA mobs at L5-3, R rotational mobs at L4-5 to promote L rotation and SB at lumbar spine                       PT Short Term Goals - 10/28/20 1120       PT SHORT TERM GOAL #1   Title Pt will demonstrate independence with updated and revised HEP    Baseline Independent in current HEP    Time 4    Period Weeks    Status Achieved    Target Date 09/16/20      PT SHORT TERM GOAL #2   Title Pt will demonstrate 10 degrees increase in L hamstring ROM and L hip flexor ROM    Baseline 60 deg hamstring, lacking 20 deg to neutral hip extension LLE Thomas test; 08/30/20 L hamstring ROM 90d in supine; 10/05/20 90d in supine    Time 4    Period Weeks    Status Achieved    Target Date 09/16/20      PT SHORT TERM GOAL #3   Title Pt will participate in stair negotiation assessment with LTG to be set    Baseline TBD; 10/05/20 Able to negotiate a full flight of steps w/o need of rails with step through  pattern    Time 4    Period Weeks    Status Achieved    Target Date 09/16/20      PT SHORT TERM GOAL #4   Title Pt will decrease five time sit to stand to </= 15 seconds without UE support    Baseline 18.2 seconds; 10/05/20 10.9s    Time 4    Period Weeks    Status Achieved    Target Date 09/16/20      PT SHORT TERM GOAL #5   Title Pt will increase gait velocity to >/= 2.8 ft/sec without AD and with decreased antalgic gait sequence    Baseline 2.46 ft/sec; 10/05/20 0.76 m/s or 2.49 ft/s; 10/29/18 Gait speed 2.33 ft/s    Time 4    Period Weeks    Status On-going    Target Date 09/16/20               PT Long Term Goals - 10/19/20 1240       PT LONG TERM GOAL #1   Title Pt will be independent with final HEP and walking program at [facility] Patient has returnes to [facility] walking and performing chair yoga    Time 4    Period Weeks    Status Achieved    Target Date 11/23/20      PT LONG TERM GOAL #2   Title Pt will negotiate 16 stairs with one rail, alternating sequence MOD I    Baseline 10/19/20 Patient able to negotiate 16 steps with step throug pattern with B rails    Time 4  Period Weeks    Status Partially Met    Target Date 11/23/20      PT LONG TERM GOAL #3   Title Pt will demonstrate symmetrical hamstring length and hip extension (Thomas test) L to R side    Baseline 10/19/20 B hamstring flexibility esentiall equal, 90d B SLR, Thomas test ROM also equal upon observation    Time 8    Period Weeks    Status Achieved      PT LONG TERM GOAL #4   Title Pt will decrease five time sit to stand to </=13 seconds to indicate decreased falls risk    Baseline 10/19/20 5x STS in 12.6s    Time 4    Period Weeks    Status Achieved    Target Date 11/23/20      PT LONG TERM GOAL #5   Title Pt will increase gait velocity to >/= 3.0 ft/sec without AD    Baseline 10/19/20 gait velocity 0.68 m/s or 2.23 ft/s    Time 4    Period Weeks    Status Partially Met     Target Date 11/23/20                   Plan - 11/24/20 1540     Clinical Impression Statement Focus of session was manual techniques to LS region.  Symptoms reproduced with lumbar flexion, L rotation and SB, manual techniques to L3-5 to promote L rotation and SB,  Soft tissue mobs to L paraspinals decreased pain.  Central discomfort resolved following manual intervention    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    Examination-Activity Limitations Bend;Locomotion Level;Stairs;Stand    Examination-Participation Restrictions Community Activity;Other   Fitness   Stability/Clinical Decision Making Evolving/Moderate complexity    Rehab Potential Good    PT Frequency 2x / week    PT Duration 4 weeks    PT Treatment/Interventions ADLs/Self Care Home Management;Aquatic Therapy;DME Instruction;Gait training;Stair training;Functional mobility training;Therapeutic activities;Therapeutic exercise;Balance training;Neuromuscular re-education;Patient/family education;Orthotic Fit/Training;Cryotherapy;Moist Heat;Manual techniques;Passive range of motion;Dry needling;Taping    PT Next Visit Plan increase lumbosacral mobility, manual mobs to reduce pain with R flex, rot and SB, progress towards goals    PT Home Exercise Plan [email or web address]    Consulted and Agree with Plan of Care Patient             Patient will benefit from skilled therapeutic intervention in order to improve the following deficits and impairments:  Decreased activity tolerance, Decreased balance, Cardiopulmonary status limiting  activity, Decreased endurance, Decreased range of motion, Decreased strength, Difficulty walking, Impaired sensation, Postural dysfunction, Pain  Visit Diagnosis: Muscle weakness (generalized)  Unsteadiness on feet  Other disturbances of skin sensation  Sciatica, left side     Problem List Patient Active Problem List   Diagnosis Date Noted   Hereditary and idiopathic neuropathy, unspecified 11/20/2020   Hyperglycemia due to type 2 diabetes mellitus ([place]) 11/20/2020   Obesity 11/20/2020   Pure hypercholesterolemia 11/20/2020   Syncope 05/26/2020   Back pain 73/71/0626   Acute metabolic encephalopathy 94/85/4627   Lumbar spinal stenosis 08/15/2019   Nonspecific chest pain 08/11/2019   Shortness of breath  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 ([place]) 10/21/2018   Nocturnal hypoxemia 10/21/2018   Radiation therapy complication 53/29/9242   Primary prostate cancer ([place]) 07/31/2017   Anemia 07/31/2017   Vertigo due to cerebrovascular disease 07/31/2017   Poor compliance with CPAP treatment 07/31/2017   Absolute anemia 05/16/2017   Avitaminosis D 05/16/2017   Benign essential HTN 05/16/2017   Benign prostatic hypertrophy without urinary obstruction 05/16/2017   Clinical depression 05/16/2017   CN (constipation) 05/16/2017   Current drug use 05/16/2017   Diabetic neuropathy ([place]) 05/16/2017   Genital herpes 05/16/2017   Infarction of lung due to iatrogenic pulmonary embolism ([place]) 05/16/2017   Sciatica associated with disorder of lumbar spine 05/16/2017   Arteriosclerosis of coronary artery 05/16/2017   Artery disease, cerebral 05/16/2017   Apnea, sleep 05/16/2017   Arthralgia of hip or thigh 05/16/2017   Malignant neoplasm of prostate ([place]) 09/28/2016   Vascular dementia in remission ([place]) 09/28/2016   Remote history of stroke 09/28/2016   Acute kidney injury ([place]) 05/18/2016   Dehydration 05/18/2016   Near syncope 05/18/2016   Orthostatic hypotension  05/18/2016   CKD (chronic kidney disease), stage II 04/26/2016   UTI (urinary tract infection) 04/26/2016   Encounter for counseling on use of CPAP 11/18/2015   Chronic cholecystitis with calculus 11/02/2015   Stroke, 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[place]) 04/22/2015   TIA (transient ischemic attack) 11/03/2014   OSA on CPAP 05/18/2014   White matter disease 08/14/2013   Abnormal x-ray of temporomandibular joint 05/30/2013   Chronic infection of sinus 05/30/2013   Cough 05/30/2013   Fatigue 05/30/2013   Unsteady gait 05/19/2013   Combined fat and carbohydrate induced hyperlipemia 04/24/2013   Syncope 03/20/2013   Angina pectoris ([place]) 10/08/2012   Hypertension    Stroke ([place])    History of TIAs    Lumbago    Other and unspecified hyperlipidemia    Personal history of unspecified circulatory disease    Unwitnessed fall    Pain in joint, multiple sites    Degeneration of cervical intervertebral disc    Unspecified cardiovascular disease    History of pulmonary embolism: June 2014,  Takes Xarelto 07/01/2012    Class: History of   Hemiparesis ([place]) 07/18/2011   Diabetes mellitus type 2 with complications ([place]) 68/34/1962   CAD in native artery 07/18/2011   History of recurrent TIAs 07/18/2011    Lanice Shirts, PT 11/24/2020, 3:46 PM  [facility] [address] [place], [place], [address] Phone: [phone]   Fax:  [phone]  Name: Kailin Leu MRN: [id number] Date of Birth: 03-26-1940

## 2020-11-26 ENCOUNTER — Emergency Department (HOSPITAL_COMMUNITY): Payer: Medicare Other

## 2020-11-26 ENCOUNTER — Encounter (HOSPITAL_COMMUNITY): Payer: Self-pay | Admitting: Emergency Medicine

## 2020-11-26 ENCOUNTER — Emergency Department (HOSPITAL_COMMUNITY)
Admission: EM | Admit: 2020-11-26 | Discharge: 2020-11-27 | Disposition: A | Discharge status: Home / Self Care | Payer: Medicare Other | Attending: Emergency Medicine | Admitting: Emergency Medicine

## 2020-11-26 ENCOUNTER — Other Ambulatory Visit: Payer: Self-pay

## 2020-11-26 DIAGNOSIS — I6523 Occlusion and stenosis of bilateral carotid arteries: Secondary | ICD-10-CM | POA: Diagnosis not present

## 2020-11-26 DIAGNOSIS — Z79899 Other long term (current) drug therapy: Secondary | ICD-10-CM | POA: Diagnosis not present

## 2020-11-26 DIAGNOSIS — Z794 Long term (current) use of insulin: Secondary | ICD-10-CM | POA: Diagnosis not present

## 2020-11-26 DIAGNOSIS — N182 Chronic kidney disease, stage 2 (mild): Secondary | ICD-10-CM | POA: Diagnosis not present

## 2020-11-26 DIAGNOSIS — I129 Hypertensive chronic kidney disease with stage 1 through stage 4 chronic kidney disease, or unspecified chronic kidney disease: Secondary | ICD-10-CM | POA: Insufficient documentation

## 2020-11-26 DIAGNOSIS — E1122 Type 2 diabetes mellitus with diabetic chronic kidney disease: Secondary | ICD-10-CM | POA: Diagnosis not present

## 2020-11-26 DIAGNOSIS — H543 Unqualified visual loss, both eyes: Secondary | ICD-10-CM

## 2020-11-26 DIAGNOSIS — Z8546 Personal history of malignant neoplasm of prostate: Secondary | ICD-10-CM | POA: Insufficient documentation

## 2020-11-26 DIAGNOSIS — I2510 Atherosclerotic heart disease of native coronary artery without angina pectoris: Secondary | ICD-10-CM | POA: Insufficient documentation

## 2020-11-26 DIAGNOSIS — R531 Weakness: Secondary | ICD-10-CM | POA: Diagnosis not present

## 2020-11-26 DIAGNOSIS — H5330 Unspecified disorder of binocular vision: Secondary | ICD-10-CM | POA: Insufficient documentation

## 2020-11-26 DIAGNOSIS — Z7984 Long term (current) use of oral hypoglycemic drugs: Secondary | ICD-10-CM | POA: Insufficient documentation

## 2020-11-26 DIAGNOSIS — R42 Dizziness and giddiness: Secondary | ICD-10-CM | POA: Diagnosis not present

## 2020-11-26 DIAGNOSIS — Z20822 Contact with and (suspected) exposure to covid-19: Secondary | ICD-10-CM | POA: Diagnosis not present

## 2020-11-26 DIAGNOSIS — G319 Degenerative disease of nervous system, unspecified: Secondary | ICD-10-CM | POA: Diagnosis not present

## 2020-11-26 DIAGNOSIS — Z7901 Long term (current) use of anticoagulants: Secondary | ICD-10-CM | POA: Insufficient documentation

## 2020-11-26 DIAGNOSIS — H547 Unspecified visual loss: Secondary | ICD-10-CM | POA: Diagnosis present

## 2020-11-26 DIAGNOSIS — I6503 Occlusion and stenosis of bilateral vertebral arteries: Secondary | ICD-10-CM | POA: Diagnosis not present

## 2020-11-26 DIAGNOSIS — I10 Essential (primary) hypertension: Secondary | ICD-10-CM | POA: Diagnosis not present

## 2020-11-26 DIAGNOSIS — H532 Diplopia: Secondary | ICD-10-CM | POA: Diagnosis not present

## 2020-11-26 DIAGNOSIS — E1140 Type 2 diabetes mellitus with diabetic neuropathy, unspecified: Secondary | ICD-10-CM | POA: Insufficient documentation

## 2020-11-26 LAB — CBC WITH DIFFERENTIAL/PLATELET
Abs Immature Granulocytes: 0.02 10*3/uL (ref 0.00–0.07)
Basophils Absolute: 0 10*3/uL (ref 0.0–0.1)
Basophils Relative: 0 %
Eosinophils Absolute: 0 10*3/uL (ref 0.0–0.5)
Eosinophils Relative: 0 %
HCT: 40.1 % (ref 39.0–52.0)
Hemoglobin: 13.7 g/dL (ref 13.0–17.0)
Immature Granulocytes: 0 %
Lymphocytes Relative: 17 %
Lymphs Abs: 0.9 10*3/uL (ref 0.7–4.0)
MCH: 31.9 pg (ref 26.0–34.0)
MCHC: 34.2 g/dL (ref 30.0–36.0)
MCV: 93.5 fL (ref 80.0–100.0)
Monocytes Absolute: 0.3 10*3/uL (ref 0.1–1.0)
Monocytes Relative: 7 %
Neutro Abs: 3.9 10*3/uL (ref 1.7–7.7)
Neutrophils Relative %: 76 %
Platelets: 182 10*3/uL (ref 150–400)
RBC: 4.29 MIL/uL (ref 4.22–5.81)
RDW: 13.2 % (ref 11.5–15.5)
WBC: 5.2 10*3/uL (ref 4.0–10.5)
nRBC: 0 % (ref 0.0–0.2)

## 2020-11-26 LAB — COMPREHENSIVE METABOLIC PANEL
ALT: 20 U/L (ref 0–44)
AST: 22 U/L (ref 15–41)
Albumin: 3.7 g/dL (ref 3.5–5.0)
Alkaline Phosphatase: 73 U/L (ref 38–126)
Anion gap: 7 (ref 5–15)
BUN: 24 mg/dL — ABNORMAL HIGH (ref 8–23)
CO2: 26 mmol/L (ref 22–32)
Calcium: 8.8 mg/dL — ABNORMAL LOW (ref 8.9–10.3)
Chloride: 103 mmol/L (ref 98–111)
Creatinine, Ser: 1.66 mg/dL — ABNORMAL HIGH (ref 0.61–1.24)
GFR, Estimated: 42 mL/min — ABNORMAL LOW (ref >60)
Glucose, Bld: 160 mg/dL — ABNORMAL HIGH (ref 70–99)
Potassium: 4.5 mmol/L (ref 3.5–5.1)
Sodium: 136 mmol/L (ref 135–145)
Total Bilirubin: 0.8 mg/dL (ref 0.3–1.2)
Total Protein: 7 g/dL (ref 6.5–8.1)

## 2020-11-26 LAB — CBG MONITORING, ED: Glucose-Capillary: 96 mg/dL (ref 70–99)

## 2020-11-26 MED ORDER — DIPHENHYDRAMINE HCL 25 MG PO CAPS: 50.0000 mg | ORAL_CAPSULE | Freq: Once | ORAL | Status: AC

## 2020-11-26 MED ORDER — HYDROCORTISONE SOD SUC (PF) 250 MG IJ SOLR
200.0000 mg | Freq: Once | INTRAMUSCULAR | Status: AC
  Administered 2020-11-26: 200 mg via INTRAVENOUS
  Filled 2020-11-26 (×2): qty 200

## 2020-11-26 MED ORDER — DIPHENHYDRAMINE HCL 50 MG/ML IJ SOLN
50.0000 mg | Freq: Once | INTRAMUSCULAR | Status: AC
  Administered 2020-11-26: 50 mg via INTRAVENOUS

## 2020-11-26 MED ORDER — IOHEXOL 350 MG/ML SOLN
75.0000 mL | Freq: Once | INTRAVENOUS | Status: AC | PRN
  Administered 2020-11-26: 75 mL via INTRAVENOUS

## 2020-11-26 NOTE — ED Notes (Signed)
Called pt for vitals, no response.

## 2020-11-26 NOTE — ED Provider Notes (Signed)
Emergency Medicine Provider Triage Evaluation Note  Steve Brown , a 80 y.o. male  was evaluated in triage.  Pt complains of diplopia and vision loss.  Patient states that today at 10 AM while watching TV he had onset of vision loss to bilateral eyes and lasted for 15 minutes.  Patient had episode of diplopia which gradually resolved after that.  Patient denies any symptoms at present.  Reports that he had diplopia when he was seen in the emergency department last week.  Review of Systems  Positive: Diplopia, vision loss Negative: Facial asymmetry, dysarthria, numbness, weakness, neck pain, neck stiffness, chest pain  Physical Exam  BP (!) 143/78 (BP Location: Left Arm)   Pulse 61   Temp 98.6 F (37 C) (Oral)   Resp 14   SpO2 100%  Gen:   Awake, no distress   Resp:  Normal effort  MSK:   Moves extremities without difficulty  Other:  .  Strength to bilateral upper and lower extremities.  CN II through XII intact.  Pronator drift noted to left arm.  Pupils PERRL.  Medical Decision Making  Medically screening exam initiated at 5:28 PM.  Appropriate orders placed.  Steve Brown was informed that the remainder of the evaluation will be completed by another provider, this initial triage assessment does not replace that evaluation, and the importance of remaining in the ED until their evaluation is complete.  Patient reports that he has weakness to left upper extremity from previous CVA.  Patient is adamant that he will not receive any MRI procedures today.   Loni Beckwith, PA-C 11/26/20 1730    Milton Ferguson, MD 11/26/20 2209

## 2020-11-26 NOTE — ED Triage Notes (Addendum)
Per EMS Pt had nausea, vomiting, weakness, and blurry/double vision about 10 am today.  Still some nausea and weakness but vision changes have resolved.  Pt has residual weakness left side from previous event years ago. Pt states he had the exact same episode last Friday. Describes vision changes as "everything went black" and then when it resolved it saw "double" for a bit.  Symptoms have resolved at time of triage.

## 2020-11-27 DIAGNOSIS — H5330 Unspecified disorder of binocular vision: Secondary | ICD-10-CM | POA: Diagnosis not present

## 2020-11-27 LAB — RESP PANEL BY RT-PCR (FLU A&B, COVID) ARPGX2
Influenza A by PCR: NEGATIVE
Influenza B by PCR: NEGATIVE
SARS Coronavirus 2 by RT PCR: NEGATIVE

## 2020-11-27 LAB — CBG MONITORING, ED: Glucose-Capillary: 108 mg/dL — ABNORMAL HIGH (ref 70–99)

## 2020-11-27 NOTE — ED Provider Notes (Signed)
[facility] EMERGENCY DEPARTMENT Provider Note   CSN: [id number] Arrival date & time: 11/26/20  1707     History Chief Complaint  Patient presents with   Weakness   Vision Changes    Steve Brown is a 80 y.o. male.  Patient presents to the emergency department for evaluation of vision loss.  Patient has had 2 episodes of complete vision loss in the last week.  Patient reports that on November 12 he was at home when he had sudden loss of his vision.  Patient reports that everything went completely black to the point where he could not see anything.  At that time he could not even call for help because he could not see his phone.  He reports that symptoms lasted about 15 minutes and then resolved.  He had "blurred vision" for some time after the event.  Patient was seen at urgent care and was Emergency department that day.  He reports that he stayed in the emergency department for some time and then was discharged.  Patient reports that he had another episode prior to coming to the emergency department.  Patient reports that he was watching TV when he had sudden loss of vision.  He reports that he had complete blackness in both of his eyes.  He cannot see anything, can still hear the TV.  This lasted 15 to 20 minutes and then resolved.  He did not have any numbness, tingling or weakness of extremities.  No difficulty with speech, although he was alone.  Denies headache.      Past Medical History:  Diagnosis Date   Abnormal prostate biopsy    Anticoagulant long-term use    currently xarelto   BPH with elevated PSA    CKD (chronic kidney disease), stage II    Complication of anesthesia    limted neck rom limited use of left arm due to cva   Coronary artery disease    CARDIOLOGIST-  DR Irish Lack--  2010-- PCI w/ stenting midLAD   DDD (degenerative disc disease), lumbar    Degeneration of cervical intervertebral disc    Depression    Diabetes mellitus without  complication (HCC)    Dyspnea on exertion    GERD (gastroesophageal reflux disease)    Hemiparesis due to cerebral infarction    History of cerebrovascular accident (CVA) with residual deficit 2002 and 2003--  hemiparisis both sides   per MRI  anterior left frontal lobe, left para midline pons, and inferior cerebullam bilaterally infarcts   History of pulmonary embolus (PE)    06-30-2012  extensive bilaterally   History of recurrent TIAs    History of syncope    hx multiple pre-syncope and syncopal episodes due to vasovagal, orthostatic hypotension, dehydration   History of TIAs    several since 2002   Hyperlipidemia    Hypertension    Mild atherosclerosis of carotid artery, bilateral    per last duplex 11-04-2014  bilateral ICA 1--39%   Neuropathy    fingers   OSA on CPAP    followed by dr dohmeier--  sev. osa w/ AHI 65.9   Prostate cancer ([place]) dx 2018   Renal insufficiency    S/P coronary artery stent placement 2010   stenting to mid LAD   Simple renal cyst    bilaterally   Stroke [facility])    Trigger finger of both hands 11-17-13   Type 2 diabetes mellitus ([place]) dx 1986   last one A1c 9.2 on  04-26-2016   Unsteady gait    . Hx prior CVA/TIAs;   Vertebral artery occlusion, left    chronic    Patient Active Problem List   Diagnosis Date Noted   Hereditary and idiopathic neuropathy, unspecified 11/20/2020   Hyperglycemia due to type 2 diabetes mellitus ([place]) 11/20/2020   Obesity 11/20/2020   Pure hypercholesterolemia 11/20/2020   Syncope 05/26/2020   Back pain 51/76/1607   Acute metabolic encephalopathy 37/10/6267   Lumbar spinal stenosis 08/15/2019   Nonspecific chest pain 08/11/2019   Shortness of breath    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 ([place]) 10/21/2018   Nocturnal hypoxemia 10/21/2018   Radiation therapy complication 48/54/6270   Primary prostate cancer ([place]) 07/31/2017   Anemia 07/31/2017   Vertigo due to cerebrovascular disease 07/31/2017    Poor compliance with CPAP treatment 07/31/2017   Absolute anemia 05/16/2017   Avitaminosis D 05/16/2017   Benign essential HTN 05/16/2017   Benign prostatic hypertrophy without urinary obstruction 05/16/2017   Clinical depression 05/16/2017   CN (constipation) 05/16/2017   Current drug use 05/16/2017   Diabetic neuropathy ([place]) 05/16/2017   Genital herpes 05/16/2017   Infarction of lung due to iatrogenic pulmonary embolism ([place]) 05/16/2017   Sciatica associated with disorder of lumbar spine 05/16/2017   Arteriosclerosis of coronary artery 05/16/2017   Artery disease, cerebral 05/16/2017   Apnea, sleep 05/16/2017   Arthralgia of hip or thigh 05/16/2017   Malignant neoplasm of prostate ([place]) 09/28/2016   Vascular dementia in remission ([place]) 09/28/2016   Remote history of stroke 09/28/2016   Acute kidney injury ([place]) 05/18/2016   Dehydration 05/18/2016   Near syncope 05/18/2016   Orthostatic hypotension 05/18/2016   CKD (chronic kidney disease), stage II 04/26/2016   UTI (urinary tract infection) 04/26/2016   Encounter for counseling on use of CPAP 11/18/2015   Chronic cholecystitis with calculus 11/02/2015   Stroke, 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[place]) 04/22/2015   TIA (transient ischemic attack) 11/03/2014   OSA on CPAP 05/18/2014   White matter disease 08/14/2013   Abnormal x-ray of temporomandibular joint 05/30/2013   Chronic infection of sinus 05/30/2013   Cough 05/30/2013   Fatigue 05/30/2013   Unsteady gait 05/19/2013   Combined fat and carbohydrate induced hyperlipemia 04/24/2013   Syncope 03/20/2013   Angina pectoris ([place]) 10/08/2012   Hypertension    Stroke ([place])    History of TIAs    Lumbago    Other and unspecified hyperlipidemia    Personal history of unspecified circulatory disease    Unwitnessed fall    Pain in joint, multiple sites    Degeneration of cervical intervertebral disc    Unspecified cardiovascular disease    History of pulmonary embolism: June 2014,   Takes Xarelto 07/01/2012    Class: History of   Hemiparesis ([place]) 07/18/2011   Diabetes mellitus type 2 with complications ([place]) 35/00/9381   CAD in native artery 07/18/2011   History of recurrent TIAs 07/18/2011    Past Surgical History:  Procedure Laterality Date   ANTERIOR CERVICAL DECOMP/DISCECTOMY FUSION  2004   C3 -- C6 limited rom   CARDIAC CATHETERIZATION  06-10-2010   dr Irish Lack   wide patent LAD stent, mid lesion at the origin of the septal prior to the previous stent 40-50%/  normal LVF, ef 55%   CARDIOVASCULAR STRESS TEST  10-23-2012  dr Irish Lack   normal nuclear perfusion study w/ no ischemia/  normal LV function and wall motion , ef 65%   CARPAL TUNNEL RELEASE Bilateral  CATARACT EXTRACTION W/ INTRAOCULAR LENS  IMPLANT, BILATERAL     CHOLECYSTECTOMY N/A 11/02/2015   Procedure: LAPAROSCOPIC CHOLECYSTECTOMY WITH INTRAOPERATIVE CHOLANGIOGRAM;  Surgeon: Donnie Mesa, MD;  Location: [place];  Service: General;  Laterality: N/A;   COLONOSCOPY     CORONARY ANGIOPLASTY WITH STENT PLACEMENT  02/2008   stenting to mid LAD   GOLD SEED IMPLANT N/A 11/15/2016   Procedure: GOLD SEED IMPLANT [place];  Surgeon: Ardis Hughs, MD;  Location: [facility];  Service: Urology;  Laterality: N/A;   IR ANGIO INTRA EXTRACRAN SEL COM CAROTID INNOMINATE BILAT MOD SED  06/13/2018   IR ANGIO VERTEBRAL SEL VERTEBRAL UNI R MOD SED  06/13/2018   IR US GUIDE VASC ACCESS RIGHT  06/13/2018   LEFT HEART CATH AND CORONARY ANGIOGRAPHY N/A 05/25/2017   Procedure: LEFT HEART CATH AND CORONARY ANGIOGRAPHY;  Surgeon: Jettie Booze, MD;  Location: [place] [facility];  Service: Cardiovascular;  Laterality: N/A;   LEFT HEART CATHETERIZATION WITH CORONARY ANGIOGRAM N/A 04/03/2013   Procedure: LEFT HEART CATHETERIZATION WITH CORONARY ANGIOGRAM;  Surgeon: Jettie Booze, MD;  Location: [facility] CATH LAB;  Service: Cardiovascular;  Laterality: N/A;  patent mLAD stent  w/ mild disease in remainder  LAD and its branches;  mod. focal lesion midLCFx- FFR of lesion was negative for ischemia/  normal LVSF, ef 50%   lungs  2005   "fluid pumped off lungs"   NEUROPLASTY / TRANSPOSITION ULNAR NERVE AT ELBOW Right 2004   PROSTATE BIOPSY N/A 08/31/2016   Procedure: PROSTATE  BIOPSY TRANSRECTAL ULTRASONIC PROSTATE (TUBP);  Surgeon: Ardis Hughs, MD;  Location: [facility];  Service: Urology;  Laterality: N/A;   SPACE OAR INSTILLATION N/A 11/15/2016   Procedure: SPACE OAR INSTILLATION;  Surgeon: Ardis Hughs, MD;  Location: [facility];  Service: Urology;  Laterality: N/A;   TRANSTHORACIC ECHOCARDIOGRAM  04/27/2016   severe focal basal LVH, ef 60-65%,  grade 2 diastoilc dysfunction/  mild AR, MR, and TR/  atrial septum lipomatous hypertrophy/  PASP [id number]   UMBILICAL HERNIA REPAIR         Family History  Problem Relation Age of Onset   Aneurysm Mother    Cancer Father        unknown either pancreatic or prostate   Stroke Brother    Dementia Sister    Heart attack Neg Hx     Social History   Tobacco Use   Smoking status: Never   Smokeless tobacco: Never  Vaping Use   Vaping Use: Never used  Substance Use Topics   Alcohol use: Not Currently   Drug use: Never    Home Medications Prior to Admission medications   Medication Sig Start Date End Date Taking? Authorizing Provider  acetaminophen (TYLENOL) 500 MG tablet Take 500 mg by mouth every 6 (six) hours as needed (pain).    [provider]  albuterol (VENTOLIN HFA) 108 (90 Base) MCG/ACT inhaler Inhale 1-2 puffs into the lungs every 6 (six) hours as needed for wheezing or shortness of breath. 09/06/20   Ward, Lenise Arena, PA-C  bisacodyl (DULCOLAX) 10 MG suppository Place 1 suppository (10 mg total) rectally daily as needed for moderate constipation. 05/31/20   Antonieta Pert, MD  diclofenac Sodium (VOLTAREN) 1 % GEL Apply 4 g topically 4 (four) times daily. 09/03/19   Deno Etienne, DO   fluticasone (FLONASE) 50 MCG/ACT nasal spray Place 1 spray into both nostrils daily as needed for allergies. 06/13/20  Medina-Vargas, Monina C, NP  furosemide (LASIX) 20 MG tablet Take 1-2 tablets (20-40 mg total) by mouth daily as needed for fluid. 06/13/20   Medina-Vargas, Monina C, NP  hydrochlorothiazide (MICROZIDE) 12.5 MG capsule Take 1 capsule (12.5 mg total) by mouth daily. 06/13/20   Medina-Vargas, Monina C, NP  insulin aspart (NOVOLOG) 100 UNIT/ML FlexPen Inject 5 Units into the skin 3 (three) times daily with meals. 06/13/20   Medina-Vargas, Monina C, NP  losartan (COZAAR) 100 MG tablet Take 1 tablet (100 mg total) by mouth daily. 06/13/20   Medina-Vargas, Monina C, NP  methocarbamol (ROBAXIN) 500 MG tablet Take 1 tablet (500 mg total) by mouth every 8 (eight) hours as needed for muscle spasms. 06/13/20   Medina-Vargas, Monina C, NP  modafinil (PROVIGIL) 100 MG tablet Take 1 tablet (100 mg total) by mouth daily. 08/23/20   Ward Givens, NP  oxyCODONE (OXY IR/ROXICODONE) 5 MG immediate release tablet Take 1 tablet (5 mg total) by mouth every 6 (six) hours as needed for up to 6 doses for severe pain. 06/08/20   Medina-Vargas, Monina C, NP  pantoprazole (PROTONIX) 40 MG tablet Take 1 tablet (40 mg total) by mouth every morning. 06/13/20   Medina-Vargas, Monina C, NP  polyethylene glycol (MIRALAX / GLYCOLAX) 17 g packet Take 17 g by mouth daily. 05/31/20   Antonieta Pert, MD  pregabalin (LYRICA) 300 MG capsule Take 300 mg by mouth 2 (two) times daily. 04/26/20   [provider]  rosuvastatin (CRESTOR) 10 MG tablet Take 1 tablet (10 mg total) by mouth daily. 06/13/20   Medina-Vargas, Monina C, NP  sitaGLIPtin (JANUVIA) 50 MG tablet Take 1 tablet (50 mg total) by mouth daily. 06/13/20   Medina-Vargas, Monina C, NP  TRESIBA FLEXTOUCH 100 UNIT/ML FlexTouch Pen Inject 34 Units into the skin daily. 06/13/20   Medina-Vargas, Monina C, NP  XARELTO 20 MG TABS tablet Take 1 tablet (20 mg total) by mouth daily. 06/13/20    Medina-Vargas, Monina C, NP    Allergies    Other, Phenergan [promethazine], Iohexol, Iohexol, Phenergan [promethazine], and Tramadol  Review of Systems   Review of Systems  Eyes:  Positive for visual disturbance.  All other systems reviewed and are negative.  Physical Exam Updated Vital Signs BP 115/62   Pulse (!) 57   Temp 98.4 F (36.9 C) (Oral)   Resp 18   SpO2 92%   Physical Exam Vitals and nursing note reviewed.  Constitutional:      General: He is not in acute distress.    Appearance: Normal appearance. He is well-developed.  HENT:     Head: Normocephalic and atraumatic.     Right Ear: Hearing normal.     Left Ear: Hearing normal.     Nose: Nose normal.  Eyes:     Conjunctiva/sclera: Conjunctivae normal.     Pupils: Pupils are equal, round, and reactive to light.  Cardiovascular:     Rate and Rhythm: Regular rhythm.     Heart sounds: S1 normal and S2 normal. No murmur heard.   No friction rub. No gallop.  Pulmonary:     Effort: Pulmonary effort is normal. No respiratory distress.     Breath sounds: Normal breath sounds.  Chest:     Chest wall: No tenderness.  Abdominal:     General: Bowel sounds are normal.     Palpations: Abdomen is soft.     Tenderness: There is no abdominal tenderness. There is no guarding or rebound. Negative signs  include Murphy's sign and McBurney's sign.     Hernia: No hernia is present.  Musculoskeletal:        General: Normal range of motion.     Cervical back: Normal range of motion and neck supple.  Skin:    General: Skin is warm and dry.     Findings: No rash.  Neurological:     Mental Status: He is alert and oriented to person, place, and time.     GCS: GCS eye subscore is 4. GCS verbal subscore is 5. GCS motor subscore is 6.     Cranial Nerves: No cranial nerve deficit.     Sensory: No sensory deficit.     Coordination: Coordination normal.  Psychiatric:        Speech: Speech normal.        Behavior: Behavior normal.         Thought Content: Thought content normal.    ED Results / Procedures / Treatments   Labs (all labs ordered are listed, but only abnormal results are displayed) Labs Reviewed  COMPREHENSIVE METABOLIC PANEL - Abnormal; Notable for the following components:      Result Value   Glucose, Bld 160 (*)    BUN 24 (*)    Creatinine, Ser 1.66 (*)    Calcium 8.8 (*)    GFR, Estimated 42 (*)    All other components within normal limits  CBG MONITORING, ED - Abnormal; Notable for the following components:   Glucose-Capillary 108 (*)    All other components within normal limits  RESP PANEL BY RT-PCR (FLU A&B, COVID) [id number]  CBC WITH DIFFERENTIAL/PLATELET  CBG MONITORING, ED    EKG None  Radiology CT ANGIO HEAD NECK W WO CM  Result Date: 11/26/2020 CLINICAL DATA:  Diplopia and dizziness EXAM: CT ANGIOGRAPHY HEAD AND NECK TECHNIQUE: Multidetector CT imaging of the head and neck was performed using the standard protocol during bolus administration of intravenous contrast. Multiplanar CT image reconstructions and MIPs were obtained to evaluate the vascular anatomy. Carotid stenosis measurements (when applicable) are obtained utilizing NASCET criteria, using the distal internal carotid diameter as the denominator. CONTRAST:  4mL OMNIPAQUE IOHEXOL 350 MG/ML SOLN COMPARISON:  None. FINDINGS: CT HEAD FINDINGS Brain: There is no mass, hemorrhage or extra-axial collection. There is generalized atrophy without lobar predilection. There is an old left cerebellar infarct. There is hypoattenuation of the periventricular white matter, most commonly indicating chronic ischemic microangiopathy. Skull: The visualized skull base, calvarium and extracranial soft tissues are normal. Sinuses/Orbits: Chronic opacification of the right sphenoid sinus. The orbits are normal. CTA NECK FINDINGS SKELETON: There is no bony spinal canal stenosis. No lytic or blastic lesion. OTHER NECK: Normal pharynx, larynx and major  salivary glands. No cervical lymphadenopathy. Unremarkable thyroid gland. UPPER CHEST: No pneumothorax or pleural effusion. No nodules or masses. AORTIC ARCH: There is calcific atherosclerosis of the aortic arch. There is no aneurysm, dissection or hemodynamically significant stenosis of the visualized portion of the aorta. Conventional 3 vessel aortic branching pattern. The visualized proximal subclavian arteries are widely patent. RIGHT CAROTID SYSTEM: No dissection, occlusion or aneurysm. There is calcific atherosclerosis extending into the proximal ICA, resulting in less than 50% stenosis. LEFT CAROTID SYSTEM: No dissection, occlusion or aneurysm. There is calcific atherosclerosis extending into the proximal ICA, resulting in less than 50% stenosis. VERTEBRAL ARTERIES: Right dominant configuration. Left vertebral artery is occluded at its origin with reconstitution of the distal V2 segment. The V3 segment is patent. Right vertebral artery  is normal. CTA HEAD FINDINGS POSTERIOR CIRCULATION: --Vertebral arteries: Normal V4 segments. --Inferior cerebellar arteries: Normal. --Basilar artery: Normal. --Superior cerebellar arteries: Normal. --Posterior cerebral arteries (PCA): Normal. ANTERIOR CIRCULATION: --Intracranial internal carotid arteries: Normal. --Anterior cerebral arteries (ACA): Normal. Both A1 segments are present. Patent anterior communicating artery (a-comm). --Middle cerebral arteries (MCA): Normal. VENOUS SINUSES: As permitted by contrast timing, patent. ANATOMIC VARIANTS: None Review of the MIP images confirms the above findings. IMPRESSION: 1. No intracranial arterial occlusion or high-grade stenosis. 2. Occlusion of the left vertebral artery at its origin with reconstitution of the distal V2 segment. Aortic Atherosclerosis (ICD10-I70.0). Electronically Signed   By: Ulyses Jarred M.D.   On: 11/26/2020 22:57    Procedures Procedures   Medications Ordered in ED Medications  hydrocortisone  sodium succinate (SOLU-CORTEF) injection 200 mg (200 mg Intravenous Given 11/26/20 1755)  diphenhydrAMINE (BENADRYL) capsule 50 mg ( Oral See Alternative 11/26/20 2036)    Or  diphenhydrAMINE (BENADRYL) injection 50 mg (50 mg Intravenous Given 11/26/20 2036)  iohexol (OMNIPAQUE) 350 MG/ML injection 75 mL (75 mLs Intravenous Contrast Given 11/26/20 2235)    ED Course  I have reviewed the triage vital signs and the nursing notes.  Pertinent labs & imaging results that were available during my care of the patient were reviewed by me and considered in my medical decision making (see chart for details).    MDM Rules/Calculators/A&P   ABCD2 Score: 4                       Patient with previous stroke presents to the emergency department with bilateral vision loss that is happened twice in a 1 week period.  Symptoms are possible for binocular amaurosis fugax.  Patient has, however, had transthoracic echo and carotid duplex performed in May of this year already.  Additionally patient is anticoagulated on Xarelto.  ABCD2 score is 4.  Discussed with Dr. Cheral Marker, on call for neurology.  He has reviewed all of the imaging.  Patient has had in addition to the above-mentioned studies, CT angiography today.  He does not feel that any additional imaging or studies are necessary.  Additionally, if this was an amaurosis fugax episode, treatment would be anticoagulation, which patient is already on.  He does not feel patient requires hospitalization at this time for further work-up as no studies are warranted.  He did feel that the patient could undergo MRI for completion of the work-up, but patient indicates that he cannot get an MRI here, needs to go outpatient to an open MRI.  As he has no current symptoms, I feel this would be a low yield test.  Discussed possibility of observation in the hospital versus discharge.  After discussing extensively risks and benefits, patient indicates that he does not feel that  there would be any benefit to staying and feels comfortable going home.  He has follow-up with his neurologist in 2 days.  He was told that he needs to call an ambulance if he has any further episodes to be brought immediately back to [facility] for repeat evaluation.  He indicates understanding.  Final Clinical Impression(s) / ED Diagnoses Final diagnoses:  Binocular vision loss    Rx / DC Orders ED Discharge Orders     None        Christpher Stogsdill, Gwenyth Allegra, MD 11/27/20 [phone]

## 2020-11-27 NOTE — Discharge Instructions (Signed)
If you have another episode of vision change, called 911 immediately.  Otherwise, see your neurologist Monday afternoon as is already scheduled.

## 2020-11-27 NOTE — ED Notes (Signed)
Pt alert, NAD, calm, interactive, resps e/u. Dressed self. VSS. Out to w/r b/r via w/c.

## 2020-11-28 NOTE — Progress Notes (Signed)
Cardiology Office Note   Date:  11/29/2020   ID:  Steve Brown, DOB [birth year], MRN [id number]  PCP:  Steve Cruel, MD    No chief complaint on file.  CAD  Wt Readings from Last 3 Encounters:  11/29/20 202 lb 12.8 oz (92 kg)  11/20/20 207 lb (93.9 kg)  11/03/20 205 lb (93 kg)       History of Present Illness: Steve Brown is a 80 y.o. male  With a h/o CAD. He has an LAD DES and moderate circumflex disease.  Per the record: He has a "past medical history significant for CAD s/p LAD stent 2010, bilateral PE 07/2012 on Xarelto, diabetes type 2, prostate cancer diagnosed in 2018- treated with radiation, OSA on CPAP, hypertension, hyperlipidemia, history of TIAs and Steve, CKD stage II.  Steve Brown had a stent to the LAD in 2010, 2012 cath showed patent stent and no significant CAD, cardiac cath in 2015 showed moderate disease which was negative by FFR. His most recent echocardiogram in 04/2016 showed normal LV systolic function with EF 60-65 percent, normal wall motion, grade 2 diastolic dysfunction, mild AR, mildly dilated aortic root 39 mm, mild MR and increased thickness of the septum consistent with lipomatous hypertrophy."     Last cath in 2019 showed: "Acute Mrg lesion is 25% stenosed. Mid RCA lesion is 25% stenosed. 2nd Mrg lesion is 40% stenosed. Mid Cx lesion is 40% stenosed. Previously placed Mid LAD stent (unknown type) is widely patent. Prox LAD-1 lesion is 25% stenosed. Prox LAD-2 lesion is 25% stenosed. The left ventricular systolic function is normal. LV end diastolic pressure is normal. The left ventricular ejection fraction is 55-65% by visual estimate. There is no aortic valve stenosis.   Patent stent.  Nonobstructive disease.  Would investigate noncardiac causes of fatigue."     He had a TIA affecting the left side of his body in 9/19.  MRI was OK.    In Jan 2020,  He reported: " Dizziness: He has seen ENT before, but he did not want to consider  surgery.  I discussed sending him to [facility] for rehab options. He is agreeable. "   Had some volume overload.  ECho in 7/21 showed: "Left ventricular ejection fraction, by estimation,  is 60 to 65%. The left ventricle has normal function. The left ventricle  has no regional wall motion abnormalities. Left ventricular diastolic  parameters were normal.   2. Right ventricular systolic function is normal. The right ventricular  size is normal. There is mildly elevated pulmonary artery systolic  pressure.   3. The mitral valve is normal in structure. Trivial mitral valve  regurgitation. No evidence of mitral stenosis.   4. The aortic valve is tricuspid. Aortic valve regurgitation is trivial.  Mild aortic valve sclerosis is present, with no evidence of aortic valve  stenosis.   5. Mild pulmonic stenosis.   6. The inferior vena cava is normal in size with greater than 50%  respiratory variability, suggesting right atrial pressure of 3 mmHg. "   Seen in ER 8/21 for fall:"chief complaint of a fall.  Patient states that his right knee has been giving him trouble and has been causing him severe pain at times and caused him to fall to the ground once or twice.  Patient had an episode like this last night and collapsed to the ground and struck the back of his head.  Complaining of pain to the low back and not really  of pain to the right knee.  Pain to the left shoulder.  He called his family doctor who suggested he come here for evaluation.  He is on Xarelto."  Had negative head a spine CT.  Back pain has limited exercise.    Had visual problems in 11/2020.  He wonders about ocular strokes.  He was losing vision while watching TV.  Vision returns after 15 minutes, but then becomes double.  He has had some nausea and vomiting.   Denies : Chest pain. Dizziness. Leg edema. Nitroglycerin use. Orthopnea. Palpitations. Paroxysmal nocturnal dyspnea. Syncope.    Doing PT for back and sciatica.     Past  Medical History:  Diagnosis Date   Abnormal prostate biopsy    Anticoagulant long-term use    currently xarelto   BPH with elevated PSA    CKD (chronic kidney disease), stage II    Complication of anesthesia    limted neck rom limited use of left arm due to cva   Coronary artery disease    CARDIOLOGIST-  DR Steve Brown--  2010-- PCI w/ stenting midLAD   DDD (degenerative disc disease), lumbar    Degeneration of cervical intervertebral disc    Depression    Diabetes mellitus without complication (HCC)    Dyspnea on exertion    GERD (gastroesophageal reflux disease)    Hemiparesis due to cerebral infarction    History of cerebrovascular accident (CVA) with residual deficit 2002 and 2003--  hemiparisis both sides   per MRI  anterior left frontal lobe, left para midline pons, and inferior cerebullam bilaterally infarcts   History of pulmonary embolus (PE)    06-30-2012  extensive bilaterally   History of recurrent TIAs    History of syncope    hx multiple pre-syncope and syncopal episodes due to vasovagal, orthostatic hypotension, dehydration   History of TIAs    several since 2002   Hyperlipidemia    Hypertension    Mild atherosclerosis of carotid artery, bilateral    per last duplex 11-04-2014  bilateral ICA 1--39%   Neuropathy    fingers   OSA on CPAP    followed by dr Steve Brown--  sev. osa w/ AHI 65.9   Prostate cancer ([place]) dx 2018   Renal insufficiency    S/P coronary artery stent placement 2010   stenting to mid LAD   Simple renal cyst    bilaterally   [facility])    Trigger finger of both hands 11-17-13   Type 2 diabetes mellitus ([place]) dx 1986   last one A1c 9.2 on 04-26-2016   Unsteady gait    . Hx prior CVA/TIAs;   Vertebral artery occlusion, left    chronic    Past Surgical History:  Procedure Laterality Date   ANTERIOR CERVICAL DECOMP/DISCECTOMY FUSION  2004   C3 -- C6 limited rom   CARDIAC CATHETERIZATION  06-10-2010   dr Steve Brown   wide patent LAD stent,  mid lesion at the origin of the septal prior to the previous stent 40-50%/  normal LVF, ef 55%   CARDIOVASCULAR STRESS TEST  10-23-2012  dr Steve Brown   normal nuclear perfusion study w/ no ischemia/  normal LV function and wall motion , ef 65%   CARPAL TUNNEL RELEASE Bilateral    CATARACT EXTRACTION W/ INTRAOCULAR LENS  IMPLANT, BILATERAL     CHOLECYSTECTOMY N/A 11/02/2015   Procedure: LAPAROSCOPIC CHOLECYSTECTOMY WITH INTRAOPERATIVE CHOLANGIOGRAM;  Surgeon: Donnie Mesa, MD;  Location: [facility];  Service: General;  Laterality: N/A;  COLONOSCOPY     CORONARY ANGIOPLASTY WITH STENT PLACEMENT  02/2008   stenting to mid LAD   GOLD SEED IMPLANT N/A 11/15/2016   Procedure: GOLD SEED IMPLANT TIMES THREE;  Surgeon: Ardis Hughs, MD;  Location: [facility];  Service: Urology;  Laterality: N/A;   IR ANGIO INTRA EXTRACRAN SEL COM CAROTID INNOMINATE BILAT MOD SED  06/13/2018   IR ANGIO VERTEBRAL SEL VERTEBRAL UNI R MOD SED  06/13/2018   IR US GUIDE VASC ACCESS RIGHT  06/13/2018   LEFT HEART CATH AND CORONARY ANGIOGRAPHY N/A 05/25/2017   Procedure: LEFT HEART CATH AND CORONARY ANGIOGRAPHY;  Surgeon: Jettie Booze, MD;  Location: [facility];  Service: Cardiovascular;  Laterality: N/A;   LEFT HEART CATHETERIZATION WITH CORONARY ANGIOGRAM N/A 04/03/2013   Procedure: LEFT HEART CATHETERIZATION WITH CORONARY ANGIOGRAM;  Surgeon: Jettie Booze, MD;  Location: [facility];  Service: Cardiovascular;  Laterality: N/A;  patent mLAD stent  w/ mild disease in remainder LAD and its branches;  mod. focal lesion midLCFx- FFR of lesion was negative for ischemia/  normal LVSF, ef 50%   lungs  2005   "fluid pumped off lungs"   NEUROPLASTY / TRANSPOSITION ULNAR NERVE AT ELBOW Right 2004   PROSTATE BIOPSY N/A 08/31/2016   Procedure: PROSTATE  BIOPSY TRANSRECTAL ULTRASONIC PROSTATE (TUBP);  Surgeon: Ardis Hughs, MD;  Location: [facility];  Service: Urology;  Laterality:  N/A;   SPACE OAR INSTILLATION N/A 11/15/2016   Procedure: SPACE OAR INSTILLATION;  Surgeon: Ardis Hughs, MD;  Location[facility];  Service: Urology;  Laterality: N/A;   TRANSTHORACIC ECHOCARDIOGRAM  04/27/2016   severe focal basal LVH, ef 60-65%,  grade 2 diastoilc dysfunction/  mild AR, MR, and TR/  atrial septum lipomatous hypertrophy/  PASP [id number]   UMBILICAL HERNIA REPAIR       Current Outpatient Medications  Medication Sig Dispense Refill   acetaminophen (TYLENOL) 500 MG tablet Take 500 mg by mouth every 6 (six) hours as needed (pain).     albuterol (VENTOLIN HFA) 108 (90 Base) MCG/ACT inhaler Inhale 1-2 puffs into the lungs every 6 (six) hours as needed for wheezing or shortness of breath. 1 each 0   bisacodyl (DULCOLAX) 10 MG suppository Place 1 suppository (10 mg total) rectally daily as needed for moderate constipation. 12 suppository 0   diclofenac Sodium (VOLTAREN) 1 % GEL Apply 4 g topically 4 (four) times daily. 100 g 0   fluticasone (FLONASE) 50 MCG/ACT nasal spray Place 1 spray into both nostrils daily as needed for allergies. 16 g 0   furosemide (LASIX) 20 MG tablet Take 1-2 tablets (20-40 mg total) by mouth daily as needed for fluid. 30 tablet 0   hydrochlorothiazide (MICROZIDE) 12.5 MG capsule Take 1 capsule (12.5 mg total) by mouth daily. 30 capsule 0   insulin aspart (NOVOLOG) 100 UNIT/ML FlexPen Inject 5 Units into the skin 3 (three) times daily with meals. 15 mL 0   losartan (COZAAR) 100 MG tablet Take 1 tablet (100 mg total) by mouth daily. 30 tablet 0   methocarbamol (ROBAXIN) 500 MG tablet Take 1 tablet (500 mg total) by mouth every 8 (eight) hours as needed for muscle spasms. 30 tablet 0   modafinil (PROVIGIL) 100 MG tablet Take 1 tablet (100 mg total) by mouth daily. 30 tablet 5   oxyCODONE (OXY IR/ROXICODONE) 5 MG immediate release tablet Take 1 tablet (5 mg total) by mouth every 6 (six)  hours as needed for up to 6 doses for severe pain. 30  tablet 0   pantoprazole (PROTONIX) 40 MG tablet Take 1 tablet (40 mg total) by mouth every morning. 30 tablet 0   polyethylene glycol (MIRALAX / GLYCOLAX) 17 g packet Take 17 g by mouth daily. 14 each 0   pregabalin (LYRICA) 300 MG capsule Take 300 mg by mouth 2 (two) times daily.     rosuvastatin (CRESTOR) 10 MG tablet Take 1 tablet (10 mg total) by mouth daily. 30 tablet 0   sitaGLIPtin (JANUVIA) 50 MG tablet Take 1 tablet (50 mg total) by mouth daily. 30 tablet 0   TRESIBA FLEXTOUCH 100 UNIT/ML FlexTouch Pen Inject 34 Units into the skin daily. 3 mL 0   XARELTO 20 MG TABS tablet Take 1 tablet (20 mg total) by mouth daily. 30 tablet 0   No current facility-administered medications for this visit.    Allergies:   Other, Phenergan [promethazine], Iohexol, Iohexol, Phenergan [promethazine], and Tramadol    Social History:  The patient  reports that he has never smoked. He has never used smokeless tobacco. He reports that he does not currently use alcohol. He reports that he does not use drugs.   Family History:  The patient's family history includes Aneurysm in his mother; Cancer in his father; Dementia in his sister; Steve in his brother.    ROS:  Please see the history of present illness.   Otherwise, review of systems are positive for visual problems.   All other systems are reviewed and negative.    PHYSICAL EXAM: VS:  BP (!) 116/56   Pulse 60   Ht 5\' 5"  (1.651 m)   Wt 202 lb 12.8 oz (92 kg)   SpO2 95%   BMI 33.75 kg/m  , BMI Body mass index is 33.75 kg/m. GEN: Well nourished, well developed, in no acute distress HEENT: normal Neck: no JVD, carotid bruits, or masses Cardiac: RRR; no murmurs, rubs, or gallops,no edema  Respiratory:  clear to auscultation bilaterally, normal work of breathing GI: soft, nontender, nondistended, + BS MS: no deformity or atrophy Skin: warm and dry, no rash Neuro:  Strength and sensation are intact Psych: euthymic mood, full affect   EKG:    The ekg ordered 11/18 demonstrates NSR, no ST changes   Recent Labs: 05/26/2020: TSH 1.731 11/26/2020: ALT 20; BUN 24; Creatinine, Ser 1.66; Hemoglobin 13.7; Platelets 182; Potassium 4.5; Sodium 136   Lipid Panel    Component Value Date/Time   CHOL 139 11/24/2019 0944   TRIG 70 11/24/2019 0944   HDL 53 11/24/2019 0944   CHOLHDL 2.6 11/24/2019 0944   CHOLHDL 4.6 08/12/2019 0424   VLDL 21 08/12/2019 0424   LDLCALC 72 11/24/2019 0944     Other studies Reviewed: Additional studies/ records that were reviewed today with results demonstrating: labs reviewed.  Cr 1.66.  Hbg 13.7   ASSESSMENT AND PLAN:  CAD:  No angina. Continue aggressive secondary prevention. S/p LAD stent.  No antiplatelet therapy since he is on Xarelto.  LE edema: Better with leg elevation.   DM: 7.5 A1C in 05/2020.  Went up to 8.3- Jardiance was started. WiIl increase rosuvastatin to 20 mg daily (high potency).  HTN: The current medical regimen is effective;  continue present plan and medications.Stay well hydrated OSA: Using CPAP.  Anticoagulated: No bleeding issues. Lifelong Xarelto for DVT.   Current medicines are reviewed at length with the patient today.  The patient concerns regarding his medicines  were addressed.  The following changes have been made:  No change  Labs/ tests ordered today include:  No orders of the defined types were placed in this encounter.   Recommend 150 minutes/week of aerobic exercise Low fat, low carb, high fiber diet recommended  Disposition:   FU in 6 months   Signed, Larae Grooms, MD  11/29/2020 12:01 PM    [facility] [address], [place], [place]  [address] Phone: [phone]; Fax: [phone]

## 2020-11-29 ENCOUNTER — Other Ambulatory Visit: Payer: Self-pay

## 2020-11-29 ENCOUNTER — Encounter: Payer: Self-pay | Admitting: Interventional Cardiology

## 2020-11-29 ENCOUNTER — Ambulatory Visit (INDEPENDENT_AMBULATORY_CARE_PROVIDER_SITE_OTHER): Payer: Medicare Other | Admitting: Interventional Cardiology

## 2020-11-29 VITALS — BP 116/56 | HR 60 | Ht 65.0 in | Wt 202.8 lb

## 2020-11-29 DIAGNOSIS — M7989 Other specified soft tissue disorders: Secondary | ICD-10-CM | POA: Diagnosis not present

## 2020-11-29 DIAGNOSIS — I25118 Atherosclerotic heart disease of native coronary artery with other forms of angina pectoris: Secondary | ICD-10-CM

## 2020-11-29 DIAGNOSIS — I2510 Atherosclerotic heart disease of native coronary artery without angina pectoris: Secondary | ICD-10-CM

## 2020-11-29 DIAGNOSIS — E1159 Type 2 diabetes mellitus with other circulatory complications: Secondary | ICD-10-CM

## 2020-11-29 DIAGNOSIS — G4733 Obstructive sleep apnea (adult) (pediatric): Secondary | ICD-10-CM

## 2020-11-29 DIAGNOSIS — G453 Amaurosis fugax: Secondary | ICD-10-CM | POA: Diagnosis not present

## 2020-11-29 DIAGNOSIS — I10 Essential (primary) hypertension: Secondary | ICD-10-CM | POA: Diagnosis not present

## 2020-11-29 DIAGNOSIS — Z7901 Long term (current) use of anticoagulants: Secondary | ICD-10-CM

## 2020-11-29 MED ORDER — ROSUVASTATIN CALCIUM 20 MG PO TABS: 20.0000 mg | ORAL_TABLET | Freq: Every day | ORAL | 3 refills | Status: DC

## 2020-11-29 NOTE — Patient Instructions (Signed)
Medication Instructions:  Your physician has recommended you make the following change in your medication: Increase rosuvastatin to 20 mg by mouth daily   *If you need a refill on your cardiac medications before your next appointment, please call your pharmacy*   Lab Work: none If you have labs (blood work) drawn today and your tests are completely normal, you will receive your results only by: [place] (if you have MyChart) OR A paper copy in the mail If you have any lab test that is abnormal or we need to change your treatment, we will call you to review the results.   Testing/Procedures: none   Follow-Up: At [facility], you and your health needs are our priority.  As part of our continuing mission to provide you with exceptional heart care, we have created designated Provider Care Teams.  These Care Teams include your primary Cardiologist (physician) and Advanced Practice Providers (APPs -  Physician Assistants and Nurse Practitioners) who all work together to provide you with the care you need, when you need it.  We recommend signing up for the patient portal called "MyChart".  Sign up information is provided on this After Visit Summary.  MyChart is used to connect with patients for Virtual Visits (Telemedicine).  Patients are able to view lab/test results, encounter notes, upcoming appointments, etc.  Non-urgent messages can be sent to your provider as well.   To learn more about what you can do with MyChart, go to [email or web address].    Your next appointment:   6 month(s)  The format for your next appointment:   In Person  Provider:   Larae Grooms, MD     Other Instructions

## 2020-11-30 ENCOUNTER — Ambulatory Visit: Payer: Medicare Other

## 2020-11-30 DIAGNOSIS — R2681 Unsteadiness on feet: Secondary | ICD-10-CM

## 2020-11-30 DIAGNOSIS — R208 Other disturbances of skin sensation: Secondary | ICD-10-CM

## 2020-11-30 DIAGNOSIS — M5432 Sciatica, left side: Secondary | ICD-10-CM

## 2020-11-30 DIAGNOSIS — R2689 Other abnormalities of gait and mobility: Secondary | ICD-10-CM | POA: Diagnosis not present

## 2020-11-30 DIAGNOSIS — M6281 Muscle weakness (generalized): Secondary | ICD-10-CM | POA: Diagnosis not present

## 2020-11-30 NOTE — Therapy (Signed)
[facility] [address] [place], [place], [address] Phone: [phone]   Fax:  [phone]  Physical Therapy Treatment  Patient Details  Name: Antoni Stefan MRN: [id number] Date of Birth: [birth year] Referring Provider (PT): Daisy Floro, MD   Encounter Date: 11/30/2020   PT End of Session - 11/30/20 1158     Visit Number 16    Number of Visits 17    Date for PT Re-Evaluation 10/15/20    Authorization Type Medicare A&B; 10th visit PN    Authorization Time Period 10/9-11/26    Progress Note Due on Visit 20    PT Start Time 1150    PT Stop Time 1230    PT Time Calculation (min) 40 min    Equipment Utilized During Treatment Gait belt    Activity Tolerance Patient tolerated treatment well    Behavior During Therapy WFL for tasks assessed/performed             Past Medical History:  Diagnosis Date   Abnormal prostate biopsy    Anticoagulant long-term use    currently xarelto   BPH with elevated PSA    CKD (chronic kidney disease), stage II    Complication of anesthesia    limted neck rom limited use of left arm due to cva   Coronary artery disease    CARDIOLOGIST-  DR Eldridge Dace--  2010-- PCI w/ stenting midLAD   DDD (degenerative disc disease), lumbar    Degeneration of cervical intervertebral disc    Depression    Diabetes mellitus without complication (HCC)    Dyspnea on exertion    GERD (gastroesophageal reflux disease)    Hemiparesis due to cerebral infarction    History of cerebrovascular accident (CVA) with residual deficit 2002 and 2003--  hemiparisis both sides   per MRI  anterior left frontal lobe, left para midline pons, and inferior cerebullam bilaterally infarcts   History of pulmonary embolus (PE)    06-30-2012  extensive bilaterally   History of recurrent TIAs    History of syncope    hx multiple pre-syncope and syncopal episodes due to vasovagal, orthostatic hypotension,  dehydration   History of TIAs    several since 2002   Hyperlipidemia    Hypertension    Mild atherosclerosis of carotid artery, bilateral    per last duplex 11-04-2014  bilateral ICA 1--39%   Neuropathy    fingers   OSA on CPAP    followed by dr dohmeier--  sev. osa w/ AHI 65.9   Prostate cancer (HCC) dx 2018   Renal insufficiency    S/P coronary artery stent placement 2010   stenting to mid LAD   Simple renal cyst    bilaterally   Stroke [facility])    Trigger finger of both hands 11-17-13   Type 2 diabetes mellitus (HCC) dx 1986   last one A1c 9.2 on 04-26-2016   Unsteady gait    . Hx prior CVA/TIAs;   Vertebral artery occlusion, left    chronic    Past Surgical History:  Procedure Laterality Date   ANTERIOR CERVICAL DECOMP/DISCECTOMY FUSION  2004   C3 -- C6 limited rom   CARDIAC CATHETERIZATION  06-10-2010   dr Eldridge Dace   wide patent LAD stent, mid lesion at the origin of the septal prior to the previous stent 40-50%/  normal LVF, ef 55%   CARDIOVASCULAR STRESS TEST  10-23-2012  dr Eldridge Dace   normal nuclear perfusion study w/ no ischemia/  normal LV function and wall motion , ef 65%   CARPAL TUNNEL RELEASE Bilateral    CATARACT EXTRACTION W/ INTRAOCULAR LENS  IMPLANT, BILATERAL     CHOLECYSTECTOMY N/A 11/02/2015   Procedure: LAPAROSCOPIC CHOLECYSTECTOMY WITH INTRAOPERATIVE CHOLANGIOGRAM;  Surgeon: Donnie Mesa, MD;  Location: [facility];  Service: General;  Laterality: N/A;   COLONOSCOPY     CORONARY ANGIOPLASTY WITH STENT PLACEMENT  02/2008   stenting to mid LAD   GOLD SEED IMPLANT N/A 11/15/2016   Procedure: GOLD SEED IMPLANT [place];  Surgeon: Ardis Hughs, MD;  Location: [facility];  Service: Urology;  Laterality: N/A;   IR ANGIO INTRA EXTRACRAN SEL COM CAROTID INNOMINATE BILAT MOD SED  06/13/2018   IR ANGIO VERTEBRAL SEL VERTEBRAL UNI R MOD SED  06/13/2018   IR US GUIDE VASC ACCESS RIGHT  06/13/2018   LEFT HEART CATH AND CORONARY ANGIOGRAPHY N/A  05/25/2017   Procedure: LEFT HEART CATH AND CORONARY ANGIOGRAPHY;  Surgeon: Jettie Booze, MD;  Location: [facility];  Service: Cardiovascular;  Laterality: N/A;   LEFT HEART CATHETERIZATION WITH CORONARY ANGIOGRAM N/A 04/03/2013   Procedure: LEFT HEART CATHETERIZATION WITH CORONARY ANGIOGRAM;  Surgeon: Jettie Booze, MD;  Location: [facility] CATH LAB;  Service: Cardiovascular;  Laterality: N/A;  patent mLAD stent  w/ mild disease in remainder LAD and its branches;  mod. focal lesion midLCFx- FFR of lesion was negative for ischemia/  normal LVSF, ef 50%   lungs  2005   "fluid pumped off lungs"   NEUROPLASTY / TRANSPOSITION ULNAR NERVE AT ELBOW Right 2004   PROSTATE BIOPSY N/A 08/31/2016   Procedure: PROSTATE  BIOPSY TRANSRECTAL ULTRASONIC PROSTATE (TUBP);  Surgeon: Ardis Hughs, MD;  Location: [facility];  Service: Urology;  Laterality: N/A;   SPACE OAR INSTILLATION N/A 11/15/2016   Procedure: SPACE OAR INSTILLATION;  Surgeon: Ardis Hughs, MD;  Location: [facility];  Service: Urology;  Laterality: N/A;   TRANSTHORACIC ECHOCARDIOGRAM  04/27/2016   severe focal basal LVH, ef 60-65%,  grade 2 diastoilc dysfunction/  mild AR, MR, and TR/  atrial septum lipomatous hypertrophy/  PASP [id number]   UMBILICAL HERNIA REPAIR      There were no vitals filed for this visit.   Subjective Assessment - 11/30/20 1152     Subjective Describes another episode of vision loss over the weekend and called EMS.  Released next day.  Has been told he has a full blockage in his    Pertinent History Long-term anticoagulant use, BPH with elevated PSA, CKD, CAD, DDD lumbar, degeneration of cervical intervertebral disc, depression, DM, DOE, hemiparesis due to CVA, multiple CVA, ACDF C3-C6, bilat carpal tunnel release, PE, h/o syncope, h/o TIA, HLD, HTN, neuropathy, OSA, prostate CA, Vertebral artery occlusion, neuroplasty/transposition ulnar nn at elbow    Limitations  Walking;Standing    How long can you sit comfortably? unimited    How long can you stand comfortably? <15 minutes    How long can you walk comfortably? <15 minutes    Patient Stated Goals To relieve LLE pain, improve walking    Pain Onset More than a month ago                               [facility] Adult PT Treatment/Exercise - 11/30/20 0001       Transfers   Transfers Sit to Stand;Stand to Sit    Sit  to Stand 6: Modified independent (Device/Increase time)    Stand to Sit 6: Modified independent (Device/Increase time)      Ambulation/Gait   Ambulation/Gait Yes    Ambulation/Gait Assistance 6: Modified independent (Device/Increase time)    Ambulation Distance (Feet) 100 Feet    Assistive device None    Ambulation Surface Level;Indoor      Knee/Hip Exercises: Aerobic   Stepper Scifit seat 16, arms 6 L4 x8 min      Manual Therapy   Manual Therapy Joint mobilization    Manual therapy comments Continued manual techniques to LS region adding grade 4 PA mobs to L5-3, 10 reps, 3 bouts followed by sustained PA pressure of 30s hold to same.  Lumbar extension initially limited to 10% increased to 25% following techniques.                       PT Short Term Goals - 10/28/20 1120       PT SHORT TERM GOAL #1   Title Pt will demonstrate independence with updated and revised HEP    Baseline Independent in current HEP    Time 4    Period Weeks    Status Achieved    Target Date 09/16/20      PT SHORT TERM GOAL #2   Title Pt will demonstrate 10 degrees increase in L hamstring ROM and L hip flexor ROM    Baseline 60 deg hamstring, lacking 20 deg to neutral hip extension LLE Thomas test; 08/30/20 L hamstring ROM 90d in supine; 10/05/20 90d in supine    Time 4    Period Weeks    Status Achieved    Target Date 09/16/20      PT SHORT TERM GOAL #3   Title Pt will participate in stair negotiation assessment with LTG to be set    Baseline TBD; 10/05/20 Able to  negotiate a full flight of steps w/o need of rails with step through pattern    Time 4    Period Weeks    Status Achieved    Target Date 09/16/20      PT SHORT TERM GOAL #4   Title Pt will decrease five time sit to stand to </= 15 seconds without UE support    Baseline 18.2 seconds; 10/05/20 10.9s    Time 4    Period Weeks    Status Achieved    Target Date 09/16/20      PT SHORT TERM GOAL #5   Title Pt will increase gait velocity to >/= 2.8 ft/sec without AD and with decreased antalgic gait sequence    Baseline 2.46 ft/sec; 10/05/20 0.76 m/s or 2.49 ft/s; 10/29/18 Gait speed 2.33 ft/s    Time 4    Period Weeks    Status On-going    Target Date 09/16/20               PT Long Term Goals - 10/19/20 1240       PT LONG TERM GOAL #1   Title Pt will be independent with final HEP and walking program at [facility] Patient has returnes to [facility] walking and performing chair yoga    Time 4    Period Weeks    Status Achieved    Target Date 11/23/20      PT LONG TERM GOAL #2   Title Pt will negotiate 16 stairs with one rail, alternating sequence MOD I    Baseline 10/19/20 Patient  able to negotiate 16 steps with step throug pattern with B rails    Time 4    Period Weeks    Status Partially Met    Target Date 11/23/20      PT LONG TERM GOAL #3   Title Pt will demonstrate symmetrical hamstring length and hip extension (Thomas test) L to R side    Baseline 10/19/20 B hamstring flexibility esentiall equal, 90d B SLR, Thomas test ROM also equal upon observation    Time 8    Period Weeks    Status Achieved      PT LONG TERM GOAL #4   Title Pt will decrease five time sit to stand to </=13 seconds to indicate decreased falls risk    Baseline 10/19/20 5x STS in 12.6s    Time 4    Period Weeks    Status Achieved    Target Date 11/23/20      PT LONG TERM GOAL #5   Title Pt will increase gait velocity to >/= 3.0 ft/sec without AD    Baseline 10/19/20 gait velocity 0.68 m/s or  2.23 ft/s    Time 4    Period Weeks    Status Partially Met    Target Date 11/23/20                   Plan - 11/30/20 1245     Clinical Impression Statement Continued manual techniques to LS region adding grade 4 PA mobs to L5-3, 10 reps, 3 bouts followed by sustained PA pressure of 30s hold to same.  Lumbar extension initially limited to 10% increased to 25% following techniques.    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    Examination-Activity Limitations Bend;Locomotion Level;Stairs;Stand    Examination-Participation Restrictions Community Activity;Other   Fitness   Stability/Clinical Decision Making Evolving/Moderate complexity    Rehab Potential Good    PT Frequency 2x / week    PT Duration 4 weeks    PT Treatment/Interventions ADLs/Self Care Home Management;Aquatic Therapy;DME Instruction;Gait training;Stair training;Functional mobility training;Therapeutic activities;Therapeutic exercise;Balance training;Neuromuscular re-education;Patient/family education;Orthotic Fit/Training;Cryotherapy;Moist Heat;Manual techniques;Passive range of motion;Dry needling;Taping    PT Next Visit Plan continue stretching and manual techniques to decrease pain and improve mobility.    PT Home Exercise Plan [id number]    Consulted and Agree with Plan of Care Patient             Patient will benefit from skilled therapeutic intervention in order to improve the following deficits and impairments:  Decreased activity tolerance, Decreased balance, Cardiopulmonary status limiting activity,  Decreased endurance, Decreased range of motion, Decreased strength, Difficulty walking, Impaired sensation, Postural dysfunction, Pain  Visit Diagnosis: Muscle weakness (generalized)  Unsteadiness on feet  Other disturbances of skin sensation  Other abnormalities of gait and mobility  Sciatica, left side     Problem List Patient Active Problem List   Diagnosis Date Noted   Hereditary and idiopathic neuropathy, unspecified 11/20/2020   Hyperglycemia due to type 2 diabetes mellitus ([place]) 11/20/2020   Obesity 11/20/2020   Pure hypercholesterolemia 11/20/2020   Syncope 05/26/2020   Back pain 01/77/9390   Acute metabolic encephalopathy 30/09/2328   Lumbar spinal stenosis  08/15/2019   Nonspecific chest pain 08/11/2019   Shortness of breath    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 (HCC) 10/21/2018   Nocturnal hypoxemia 10/21/2018   Radiation therapy complication 71/06/2692   Primary prostate cancer ([place]) 07/31/2017   Anemia 07/31/2017   Vertigo due to cerebrovascular disease 07/31/2017   Poor compliance with CPAP treatment 07/31/2017   Absolute anemia 05/16/2017   Avitaminosis D 05/16/2017   Benign essential HTN 05/16/2017   Benign prostatic hypertrophy without urinary obstruction 05/16/2017   Clinical depression 05/16/2017   CN (constipation) 05/16/2017   Current drug use 05/16/2017   Diabetic neuropathy ([place]) 05/16/2017   Genital herpes 05/16/2017   Infarction of lung due to iatrogenic pulmonary embolism ([place]) 05/16/2017   Sciatica associated with disorder of lumbar spine 05/16/2017   Arteriosclerosis of coronary artery 05/16/2017   Artery disease, cerebral 05/16/2017   Apnea, sleep 05/16/2017   Arthralgia of hip or thigh 05/16/2017   Malignant neoplasm of prostate ([place]) 09/28/2016   Vascular dementia in remission ([place]) 09/28/2016   Remote history of stroke 09/28/2016   Acute kidney injury ([place]) 05/18/2016   Dehydration 05/18/2016   Near syncope  05/18/2016   Orthostatic hypotension 05/18/2016   CKD (chronic kidney disease), stage II 04/26/2016   UTI (urinary tract infection) 04/26/2016   Encounter for counseling on use of CPAP 11/18/2015   Chronic cholecystitis with calculus 11/02/2015   Stroke, 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[place]) 04/22/2015   TIA (transient ischemic attack) 11/03/2014   OSA on CPAP 05/18/2014   White matter disease 08/14/2013   Abnormal x-ray of temporomandibular joint 05/30/2013   Chronic infection of sinus 05/30/2013   Cough 05/30/2013   Fatigue 05/30/2013   Unsteady gait 05/19/2013   Combined fat and carbohydrate induced hyperlipemia 04/24/2013   Syncope 03/20/2013   Angina pectoris ([place]) 10/08/2012   Hypertension    Stroke ([place])    History of TIAs    Lumbago    Other and unspecified hyperlipidemia    Personal history of unspecified circulatory disease    Unwitnessed fall    Pain in joint, multiple sites    Degeneration of cervical intervertebral disc    Unspecified cardiovascular disease    History of pulmonary embolism: June 2014,  Takes Xarelto 07/01/2012    Class: History of   Hemiparesis ([place]) 07/18/2011   Diabetes mellitus type 2 with complications ([place]) 85/46/2703   CAD in native artery 07/18/2011   History of recurrent TIAs 07/18/2011    Lanice Shirts, PT 11/30/2020, 12:57 PM  [facility] [address] [place], [place], [address] Phone: [phone]   Fax:  [phone]  Name: Jaquavious Mercer MRN: [id number] Date of Birth: 1940-01-20

## 2020-12-09 ENCOUNTER — Ambulatory Visit: Payer: Medicare Other | Attending: Family Medicine

## 2020-12-09 ENCOUNTER — Other Ambulatory Visit: Payer: Self-pay

## 2020-12-09 DIAGNOSIS — M79605 Pain in left leg: Secondary | ICD-10-CM | POA: Diagnosis not present

## 2020-12-09 DIAGNOSIS — R2681 Unsteadiness on feet: Secondary | ICD-10-CM | POA: Diagnosis not present

## 2020-12-09 DIAGNOSIS — M6281 Muscle weakness (generalized): Secondary | ICD-10-CM | POA: Insufficient documentation

## 2020-12-09 DIAGNOSIS — M5432 Sciatica, left side: Secondary | ICD-10-CM | POA: Diagnosis not present

## 2020-12-09 NOTE — Therapy (Signed)
[facility] [address] [place], [place], [address] Phone: [phone]   Fax:  [phone]  Physical Therapy Treatment  Patient Details  Name: Steve Brown MRN: [id number] Date of Birth: [birth year] Referring Provider (PT): Daisy Floro, MD   Encounter Date: 12/09/2020   PT End of Session - 12/09/20 1611     Visit Number 17    Number of Visits 25    Date for PT Re-Evaluation 01/06/21    Authorization Type Medicare A&B; 10th visit PN    Authorization Time Period 11/26-12/29    Progress Note Due on Visit 20    PT Start Time 1530    PT Stop Time 1615    PT Time Calculation (min) 45 min    Equipment Utilized During Treatment Gait belt    Activity Tolerance Patient tolerated treatment well    Behavior During Therapy WFL for tasks assessed/performed             Past Medical History:  Diagnosis Date   Abnormal prostate biopsy    Anticoagulant long-term use    currently xarelto   BPH with elevated PSA    CKD (chronic kidney disease), stage II    Complication of anesthesia    limted neck rom limited use of left arm due to cva   Coronary artery disease    CARDIOLOGIST-  DR Eldridge Dace--  2010-- PCI w/ stenting midLAD   DDD (degenerative disc disease), lumbar    Degeneration of cervical intervertebral disc    Depression    Diabetes mellitus without complication (HCC)    Dyspnea on exertion    GERD (gastroesophageal reflux disease)    Hemiparesis due to cerebral infarction    History of cerebrovascular accident (CVA) with residual deficit 2002 and 2003--  hemiparisis both sides   per MRI  anterior left frontal lobe, left para midline pons, and inferior cerebullam bilaterally infarcts   History of pulmonary embolus (PE)    06-30-2012  extensive bilaterally   History of recurrent TIAs    History of syncope    hx multiple pre-syncope and syncopal episodes due to vasovagal, orthostatic hypotension,  dehydration   History of TIAs    several since 2002   Hyperlipidemia    Hypertension    Mild atherosclerosis of carotid artery, bilateral    per last duplex 11-04-2014  bilateral ICA 1--39%   Neuropathy    fingers   OSA on CPAP    followed by dr dohmeier--  sev. osa w/ AHI 65.9   Prostate cancer (HCC) dx 2018   Renal insufficiency    S/P coronary artery stent placement 2010   stenting to mid LAD   Simple renal cyst    bilaterally   Stroke [facility])    Trigger finger of both hands 11-17-13   Type 2 diabetes mellitus (HCC) dx 1986   last one A1c 9.2 on 04-26-2016   Unsteady gait    . Hx prior CVA/TIAs;   Vertebral artery occlusion, left    chronic    Past Surgical History:  Procedure Laterality Date   ANTERIOR CERVICAL DECOMP/DISCECTOMY FUSION  2004   C3 -- C6 limited rom   CARDIAC CATHETERIZATION  06-10-2010   dr Eldridge Dace   wide patent LAD stent, mid lesion at the origin of the septal prior to the previous stent 40-50%/  normal LVF, ef 55%   CARDIOVASCULAR STRESS TEST  10-23-2012  dr Eldridge Dace   normal nuclear perfusion study w/ no ischemia/  normal LV function and wall motion , ef 65%   CARPAL TUNNEL RELEASE Bilateral    CATARACT EXTRACTION W/ INTRAOCULAR LENS  IMPLANT, BILATERAL     CHOLECYSTECTOMY N/A 11/02/2015   Procedure: LAPAROSCOPIC CHOLECYSTECTOMY WITH INTRAOPERATIVE CHOLANGIOGRAM;  Surgeon: Donnie Mesa, MD;  Location: [facility];  Service: General;  Laterality: N/A;   COLONOSCOPY     CORONARY ANGIOPLASTY WITH STENT PLACEMENT  02/2008   stenting to mid LAD   GOLD SEED IMPLANT N/A 11/15/2016   Procedure: GOLD SEED IMPLANT [place];  Surgeon: Ardis Hughs, MD;  Location: [facility];  Service: Urology;  Laterality: N/A;   IR ANGIO INTRA EXTRACRAN SEL COM CAROTID INNOMINATE BILAT MOD SED  06/13/2018   IR ANGIO VERTEBRAL SEL VERTEBRAL UNI R MOD SED  06/13/2018   IR US GUIDE VASC ACCESS RIGHT  06/13/2018   LEFT HEART CATH AND CORONARY ANGIOGRAPHY N/A  05/25/2017   Procedure: LEFT HEART CATH AND CORONARY ANGIOGRAPHY;  Surgeon: Jettie Booze, MD;  Location: [place] [facility];  Service: Cardiovascular;  Laterality: N/A;   LEFT HEART CATHETERIZATION WITH CORONARY ANGIOGRAM N/A 04/03/2013   Procedure: LEFT HEART CATHETERIZATION WITH CORONARY ANGIOGRAM;  Surgeon: Jettie Booze, MD;  Location: [facility] CATH LAB;  Service: Cardiovascular;  Laterality: N/A;  patent mLAD stent  w/ mild disease in remainder LAD and its branches;  mod. focal lesion midLCFx- FFR of lesion was negative for ischemia/  normal LVSF, ef 50%   lungs  2005   "fluid pumped off lungs"   NEUROPLASTY / TRANSPOSITION ULNAR NERVE AT ELBOW Right 2004   PROSTATE BIOPSY N/A 08/31/2016   Procedure: PROSTATE  BIOPSY TRANSRECTAL ULTRASONIC PROSTATE (TUBP);  Surgeon: Ardis Hughs, MD;  Location: [facility];  Service: Urology;  Laterality: N/A;   SPACE OAR INSTILLATION N/A 11/15/2016   Procedure: SPACE OAR INSTILLATION;  Surgeon: Ardis Hughs, MD;  Location: [facility];  Service: Urology;  Laterality: N/A;   TRANSTHORACIC ECHOCARDIOGRAM  04/27/2016   severe focal basal LVH, ef 60-65%,  grade 2 diastoilc dysfunction/  mild AR, MR, and TR/  atrial septum lipomatous hypertrophy/  PASP [id number]   UMBILICAL HERNIA REPAIR      There were no vitals filed for this visit.   Subjective Assessment - 12/09/20 1534     Subjective Will see an ENT next week at the request of Dr. Harrington Challenger.  Back symptoms come and go but feels more flexible    Pertinent History Long-term anticoagulant use, BPH with elevated PSA, CKD, CAD, DDD lumbar, degeneration of cervical intervertebral disc, depression, DM, DOE, hemiparesis due to CVA, multiple CVA, ACDF C3-C6, bilat carpal tunnel release, PE, h/o syncope, h/o TIA, HLD, HTN, neuropathy, OSA, prostate CA, Vertebral artery occlusion, neuroplasty/transposition ulnar nn at elbow    Limitations Walking;Standing    How long can you  sit comfortably? unimited    How long can you stand comfortably? <15 minutes    How long can you walk comfortably? <15 minutes    Patient Stated Goals To relieve LLE pain, improve walking    Pain Onset More than a month ago                               [facility] Adult PT Treatment/Exercise - 12/09/20 0001       Transfers   Transfers Sit to Stand;Stand to Sit    Sit to Stand 6: Modified independent (  Device/Increase time)    Stand to Sit 6: Modified independent (Device/Increase time)      Ambulation/Gait   Ambulation/Gait Yes    Ambulation/Gait Assistance 6: Modified independent (Device/Increase time)    Ambulation Distance (Feet) 100 Feet    Assistive device None    Ambulation Surface Level;Indoor      Knee/Hip Exercises: Aerobic   Stepper Scifit seat 16, arms 6 L4 x8 min      Manual Therapy   Manual Therapy Joint mobilization    Manual therapy comments Continued manual techniques to LS region adding grade 4 PA mobs to L5-3, 10 reps, 3 bouts followed by sustained PA pressure of 30s to L PSIS.  Instructed in prone prop for 2 minute duration.  Assessed SI mobility to find a posterior rotation of the L ilium                       PT Short Term Goals - 12/09/20 1540       PT SHORT TERM GOAL #1   Title Pt will demonstrate independence with updated and revised HEP    Baseline Independent in current HEP    Time 4    Period Weeks    Status Achieved    Target Date 09/16/20      PT SHORT TERM GOAL #2   Title Pt will demonstrate 10 degrees increase in L hamstring ROM and L hip flexor ROM    Baseline 60 deg hamstring, lacking 20 deg to neutral hip extension LLE Thomas test; 08/30/20 L hamstring ROM 90d in supine; 10/05/20 90d in supine    Time 4    Period Weeks    Status Achieved    Target Date 09/16/20      PT SHORT TERM GOAL #3   Title Pt will participate in stair negotiation assessment with LTG to be set    Baseline TBD; 10/05/20 Able to negotiate  a full flight of steps w/o need of rails with step through pattern    Time 4    Period Weeks    Status Achieved    Target Date 09/16/20      PT SHORT TERM GOAL #4   Title Pt will decrease five time sit to stand to </= 15 seconds without UE support    Baseline 18.2 seconds; 10/05/20 10.9s    Time 4    Period Weeks    Status Achieved    Target Date 09/16/20      PT SHORT TERM GOAL #5   Title Pt will increase gait velocity to >/= 2.8 ft/sec without AD and with decreased antalgic gait sequence    Baseline 2.46 ft/sec; 10/05/20 0.76 m/s or 2.49 ft/s; 10/29/18 Gait speed 2.33 ft/s; 12/09/20 2/79 ft/s or 0.85 m/s    Time 4    Period Weeks    Status Achieved    Target Date 09/16/20               PT Long Term Goals - 12/09/20 1605       PT LONG TERM GOAL #1   Title Pt will be independent with final HEP and walking program at [place] Patient has returnes to [facility] walking and performing chair yoga    Time 4    Period Weeks    Status Achieved    Target Date 01/06/21      PT LONG TERM GOAL #2   Title Pt will negotiate 16 stairs with one  rail, alternating sequence MOD I    Baseline 10/19/20 Patient able to negotiate 16 steps with step throug pattern with B rails    Time 4    Period Weeks    Status Partially Met    Target Date 01/06/21      PT LONG TERM GOAL #3   Title Pt will demonstrate symmetrical hamstring length and hip extension (Thomas test) L to R side    Baseline 10/19/20 B hamstring flexibility esentiall equal, 90d B SLR, Thomas test ROM also equal upon observation    Time 8    Period Weeks    Status Achieved    Target Date 01/06/21      PT LONG TERM GOAL #4   Title Pt will decrease five time sit to stand to </=13 seconds to indicate decreased falls risk    Baseline 10/19/20 5x STS in 12.6s    Time 4    Period Weeks    Status Achieved      PT LONG TERM GOAL #5   Title Pt will increase gait velocity to >/= 3.0 ft/sec without AD    Baseline 10/19/20 gait  velocity 0.68 m/s or 2.23 ft/s    Time 4    Period Weeks    Status Partially Met    Target Date 01/06/21                   Plan - 12/09/20 1612     Clinical Impression Statement Reassessed SI alignment  and found posterior ilial rotation and corresponding short LLE, ant PA pressure to L PSIS in prone for 30s followed by re-assess with equal pelvic alignmet observed.  PA mobs to L5-3 grade 4 10x each with audible "pop" noted, painless.  Follow up on pelvic alignment    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    Examination-Activity Limitations Bend;Locomotion Level;Stairs;Stand    Examination-Participation Restrictions Community Activity;Other   Fitness   Stability/Clinical Decision Making Evolving/Moderate complexity    Rehab Potential Good    PT Frequency 2x / week    PT Duration 4 weeks    PT Treatment/Interventions ADLs/Self Care Home Management;Aquatic Therapy;DME Instruction;Gait training;Stair training;Functional mobility training;Therapeutic activities;Therapeutic exercise;Balance training;Neuromuscular re-education;Patient/family education;Orthotic Fit/Training;Cryotherapy;Moist Heat;Manual techniques;Passive range of motion;Dry needling;Taping    PT Next Visit Plan continue stretching and manual techniques to decrease pain and improve mobility.  Assess SI region    PT Home Exercise Plan [email or web address]    Consulted and Agree with Plan of Care Patient             Patient will benefit from skilled therapeutic intervention in order to improve the following deficits and  impairments:  Decreased activity tolerance, Decreased balance, Cardiopulmonary status limiting activity, Decreased endurance, Decreased range of motion, Decreased strength, Difficulty walking, Impaired sensation, Postural dysfunction, Pain  Visit Diagnosis: Muscle weakness (generalized)  Unsteadiness on feet  Sciatica, left side     Problem List Patient Active Problem List   Diagnosis Date Noted   Hereditary and idiopathic neuropathy, unspecified 11/20/2020   Hyperglycemia due to type 2 diabetes mellitus (HCC) 11/20/2020   Obesity 11/20/2020   Pure hypercholesterolemia 11/20/2020   Syncope 05/26/2020  Back pain 49/70/2637   Acute metabolic encephalopathy 85/88/5027   Lumbar spinal stenosis 08/15/2019   Nonspecific chest pain 08/11/2019   Shortness of breath    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 ([place]) 10/21/2018   Nocturnal hypoxemia 10/21/2018   Radiation therapy complication 74/12/8784   Primary prostate cancer ([place]) 07/31/2017   Anemia 07/31/2017   Vertigo due to cerebrovascular disease 07/31/2017   Poor compliance with CPAP treatment 07/31/2017   Absolute anemia 05/16/2017   Avitaminosis D 05/16/2017   Benign essential HTN 05/16/2017   Benign prostatic hypertrophy without urinary obstruction 05/16/2017   Clinical depression 05/16/2017   CN (constipation) 05/16/2017   Current drug use 05/16/2017   Diabetic neuropathy ([place]) 05/16/2017   Genital herpes 05/16/2017   Infarction of lung due to iatrogenic pulmonary embolism ([place]) 05/16/2017   Sciatica associated with disorder of lumbar spine 05/16/2017   Arteriosclerosis of coronary artery 05/16/2017   Artery disease, cerebral 05/16/2017   Apnea, sleep 05/16/2017   Arthralgia of hip or thigh 05/16/2017   Malignant neoplasm of prostate ([place]) 09/28/2016   Vascular dementia in remission ([place]) 09/28/2016   Remote history of stroke 09/28/2016   Acute kidney injury ([place]) 05/18/2016   Dehydration 05/18/2016    Near syncope 05/18/2016   Orthostatic hypotension 05/18/2016   CKD (chronic kidney disease), stage II 04/26/2016   UTI (urinary tract infection) 04/26/2016   Encounter for counseling on use of CPAP 11/18/2015   Chronic cholecystitis with calculus 11/02/2015   Stroke, 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[place]) 04/22/2015   TIA (transient ischemic attack) 11/03/2014   OSA on CPAP 05/18/2014   White matter disease 08/14/2013   Abnormal x-ray of temporomandibular joint 05/30/2013   Chronic infection of sinus 05/30/2013   Cough 05/30/2013   Fatigue 05/30/2013   Unsteady gait 05/19/2013   Combined fat and carbohydrate induced hyperlipemia 04/24/2013   Syncope 03/20/2013   Angina pectoris ([place]) 10/08/2012   Hypertension    Stroke ([place])    History of TIAs    Lumbago    Other and unspecified hyperlipidemia    Personal history of unspecified circulatory disease    Unwitnessed fall    Pain in joint, multiple sites    Degeneration of cervical intervertebral disc    Unspecified cardiovascular disease    History of pulmonary embolism: June 2014,  Takes Xarelto 07/01/2012    Class: History of   Hemiparesis ([place]) 07/18/2011   Diabetes mellitus type 2 with complications ([place]) 76/72/0947   CAD in native artery 07/18/2011   History of recurrent TIAs 07/18/2011    Lanice Shirts, PT 12/09/2020, 5:42 PM  [facility] [address] [place], [place], [address] Phone: [phone]   Fax:  [phone]  Name: Cayle Thunder MRN: [id number] Date of Birth: 10/05/1940

## 2020-12-09 NOTE — Patient Instructions (Signed)
Access Code: [email or web address] URL: [email or web address] Date: 12/09/2020 Prepared by: Sharlynn Oliphant  Exercises Squat - 2 x daily - 7 x weekly - 2 sets - 10 reps Prone on Elbows Stretch - 2 x daily - 7 x weekly - 1-2 sets - 2 min hold

## 2020-12-14 DIAGNOSIS — E1165 Type 2 diabetes mellitus with hyperglycemia: Secondary | ICD-10-CM | POA: Diagnosis not present

## 2020-12-14 DIAGNOSIS — I10 Essential (primary) hypertension: Secondary | ICD-10-CM | POA: Diagnosis not present

## 2020-12-15 ENCOUNTER — Ambulatory Visit: Payer: Medicare Other

## 2020-12-15 ENCOUNTER — Other Ambulatory Visit: Payer: Self-pay

## 2020-12-15 DIAGNOSIS — M5432 Sciatica, left side: Secondary | ICD-10-CM | POA: Diagnosis not present

## 2020-12-15 DIAGNOSIS — M6281 Muscle weakness (generalized): Secondary | ICD-10-CM

## 2020-12-15 DIAGNOSIS — M79605 Pain in left leg: Secondary | ICD-10-CM

## 2020-12-15 DIAGNOSIS — R2681 Unsteadiness on feet: Secondary | ICD-10-CM | POA: Diagnosis not present

## 2020-12-15 NOTE — Therapy (Signed)
[place], [place], [address] Phone: [phone]   Fax:  [phone]  Physical Therapy Treatment  Patient Details  Name: Steve Brown MRN: [id number] Date of Birth: 1940/03/30 Referring Provider (PT): Lawerance Cruel, MD   Encounter Date: 12/15/2020   PT End of Session - 12/15/20 1330     Visit Number 18    Number of Visits 25    Date for PT Re-Evaluation 01/06/21    Authorization Type Medicare A&B; 10th visit PN    Authorization Time Period 11/26-12/29    Progress Note Due on Visit 51    PT Start Time 1315    PT Stop Time 1400    PT Time Calculation (min) 45 min    Equipment Utilized During Treatment Gait belt    Activity Tolerance Patient tolerated treatment well    Behavior During Therapy WFL for tasks assessed/performed             Past Medical History:  Diagnosis Date   Abnormal prostate biopsy    Anticoagulant long-term use    currently xarelto   BPH with elevated PSA    CKD (chronic kidney disease), stage II    Complication of anesthesia    limted neck rom limited use of left arm due to cva   Coronary artery disease    CARDIOLOGIST-  DR Irish Lack--  2010-- PCI w/ stenting midLAD   DDD (degenerative disc disease), lumbar    Degeneration of cervical intervertebral disc    Depression    Diabetes mellitus without complication (HCC)    Dyspnea on exertion    GERD (gastroesophageal reflux disease)    Hemiparesis due to cerebral infarction    History of cerebrovascular accident (CVA) with residual deficit 2002 and 2003--  hemiparisis both sides   per MRI  anterior left frontal lobe, left para midline pons, and inferior cerebullam bilaterally infarcts   History of pulmonary embolus (PE)    06-30-2012  extensive bilaterally   History of recurrent TIAs    History of syncope    hx multiple pre-syncope and syncopal episodes due to vasovagal, orthostatic hypotension, dehydration    History of TIAs    several since 2002   Hyperlipidemia    Hypertension    Mild atherosclerosis of carotid artery, bilateral    per last duplex 11-04-2014  bilateral ICA 1--39%   Neuropathy    fingers   OSA on CPAP    followed by dr dohmeier--  sev. osa w/ AHI 65.9   Prostate cancer ([place]) dx 2018   Renal insufficiency    S/P coronary artery stent placement 2010   stenting to mid LAD   Simple renal cyst    bilaterally   Stroke [facility])    Trigger finger of both hands 11-17-13   Type 2 diabetes mellitus ([place]) dx 1986   last one A1c 9.2 on 04-26-2016   Unsteady gait    . Hx prior CVA/TIAs;   Vertebral artery occlusion, left    chronic    Past Surgical History:  Procedure Laterality Date   ANTERIOR CERVICAL DECOMP/DISCECTOMY FUSION  2004   C3 -- C6 limited rom   CARDIAC CATHETERIZATION  06-10-2010   dr Irish Lack   wide patent LAD stent, mid lesion at the origin of the septal prior to the previous stent 40-50%/  normal LVF, ef 55%   CARDIOVASCULAR STRESS TEST  10-23-2012  dr Irish Lack   normal nuclear perfusion study w/ no ischemia/  normal  LV function and wall motion , ef 65%   CARPAL TUNNEL RELEASE Bilateral    CATARACT EXTRACTION W/ INTRAOCULAR LENS  IMPLANT, BILATERAL     CHOLECYSTECTOMY N/A 11/02/2015   Procedure: LAPAROSCOPIC CHOLECYSTECTOMY WITH INTRAOPERATIVE CHOLANGIOGRAM;  Surgeon: Donnie Mesa, MD;  Location: [facility];  Service: General;  Laterality: N/A;   COLONOSCOPY     CORONARY ANGIOPLASTY WITH STENT PLACEMENT  02/2008   stenting to mid LAD   GOLD SEED IMPLANT N/A 11/15/2016   Procedure: GOLD SEED IMPLANT [place];  Surgeon: Ardis Hughs, MD;  Location: [facility];  Service: Urology;  Laterality: N/A;   IR ANGIO INTRA EXTRACRAN SEL COM CAROTID INNOMINATE BILAT MOD SED  06/13/2018   IR ANGIO VERTEBRAL SEL VERTEBRAL UNI R MOD SED  06/13/2018   IR US GUIDE VASC ACCESS RIGHT  06/13/2018   LEFT HEART CATH AND CORONARY ANGIOGRAPHY N/A 05/25/2017    Procedure: LEFT HEART CATH AND CORONARY ANGIOGRAPHY;  Surgeon: Jettie Booze, MD;  Location: [place];  Service: Cardiovascular;  Laterality: N/A;   LEFT HEART CATHETERIZATION WITH CORONARY ANGIOGRAM N/A 04/03/2013   Procedure: LEFT HEART CATHETERIZATION WITH CORONARY ANGIOGRAM;  Surgeon: Jettie Booze, MD;  Location: [facility] CATH LAB;  Service: Cardiovascular;  Laterality: N/A;  patent mLAD stent  w/ mild disease in remainder LAD and its branches;  mod. focal lesion midLCFx- FFR of lesion was negative for ischemia/  normal LVSF, ef 50%   lungs  2005   "fluid pumped off lungs"   NEUROPLASTY / TRANSPOSITION ULNAR NERVE AT ELBOW Right 2004   PROSTATE BIOPSY N/A 08/31/2016   Procedure: PROSTATE  BIOPSY TRANSRECTAL ULTRASONIC PROSTATE (TUBP);  Surgeon: Ardis Hughs, MD;  Location: [facility];  Service: Urology;  Laterality: N/A;   SPACE OAR INSTILLATION N/A 11/15/2016   Procedure: SPACE OAR INSTILLATION;  Surgeon: Ardis Hughs, MD;  [facility];  Service: Urology;  Laterality: N/A;   TRANSTHORACIC ECHOCARDIOGRAM  04/27/2016   severe focal basal LVH, ef 60-65%,  grade 2 diastoilc dysfunction/  mild AR, MR, and TR/  atrial septum lipomatous hypertrophy/  PASP [id number]   UMBILICAL HERNIA REPAIR      There were no vitals filed for this visit.   Subjective Assessment - 12/15/20 1318     Subjective Feels symptoms are less severe and less frequent, feels best following sessions but relief does not last form session to session.  Has been compliant with HEP.    Pertinent History Long-term anticoagulant use, BPH with elevated PSA, CKD, CAD, DDD lumbar, degeneration of cervical intervertebral disc, depression, DM, DOE, hemiparesis due to CVA, multiple CVA, ACDF C3-C6, bilat carpal tunnel release, PE, h/o syncope, h/o TIA, HLD, HTN, neuropathy, OSA, prostate CA, Vertebral artery occlusion, neuroplasty/transposition ulnar nn at elbow     Limitations Walking;Standing    How long can you sit comfortably? unimited    How long can you stand comfortably? <15 minutes    How long can you walk comfortably? <15 minutes    Patient Stated Goals To relieve LLE pain, improve walking    Pain Onset More than a month ago                               [facility] Adult PT Treatment/Exercise - 12/15/20 0001       Transfers   Transfers Independent with all Transfers      Ambulation/Gait  Ambulation/Gait Yes    Ambulation/Gait Assistance 7: Independent    Ambulation Distance (Feet) 100 Feet    Assistive device None    Ambulation Surface Level;Indoor      Lumbar Exercises: Stretches   Other Lumbar Stretch Exercise sidelie lumbar SB stretch B, knees/hips at 90/90 with feet over side to promote lumbar SB, performed B for 30-60s hold      Lumbar Exercises: Seated   Other Seated Lumbar Exercises D1 flex and D1 extension performed with yellow band, 10 reps ach position and UE extremity      Knee/Hip Exercises: Standing   Forward Lunges Both;1 set;10 reps;Limitations    Forward Lunges Limitations performed to 4" cushioned block, 10x per side      Knee/Hip Exercises: Seated   Long Arc Quad Both;2 sets;15 reps;Limitations    Long Arc Quad Limitations with latissimus press    Marching Both;2 sets;15 reps;Limitations    Marching Limitations with latissimus press      Manual Therapy   Manual Therapy Joint mobilization    Manual therapy comments Continued manual techniques to LS region adding grade 4 PA mobs to L5-3, 10 reps, 3 bouts followed by sustained PA pressure of 30s to L PSIS.  Instructed in prone prop for 2 minute duration.  Assessed SI mobility to find a posterior rotation of the L ilium                       PT Short Term Goals - 12/09/20 1540       PT SHORT TERM GOAL #1   Title Pt will demonstrate independence with updated and revised HEP    Baseline Independent in current HEP    Time 4     Period Weeks    Status Achieved    Target Date 09/16/20      PT SHORT TERM GOAL #2   Title Pt will demonstrate 10 degrees increase in L hamstring ROM and L hip flexor ROM    Baseline 60 deg hamstring, lacking 20 deg to neutral hip extension LLE Thomas test; 08/30/20 L hamstring ROM 90d in supine; 10/05/20 90d in supine    Time 4    Period Weeks    Status Achieved    Target Date 09/16/20      PT SHORT TERM GOAL #3   Title Pt will participate in stair negotiation assessment with LTG to be set    Baseline TBD; 10/05/20 Able to negotiate a full flight of steps w/o need of rails with step through pattern    Time 4    Period Weeks    Status Achieved    Target Date 09/16/20      PT SHORT TERM GOAL #4   Title Pt will decrease five time sit to stand to </= 15 seconds without UE support    Baseline 18.2 seconds; 10/05/20 10.9s    Time 4    Period Weeks    Status Achieved    Target Date 09/16/20      PT SHORT TERM GOAL #5   Title Pt will increase gait velocity to >/= 2.8 ft/sec without AD and with decreased antalgic gait sequence    Baseline 2.46 ft/sec; 10/05/20 0.76 m/s or 2.49 ft/s; 10/29/18 Gait speed 2.33 ft/s; 12/09/20 2/79 ft/s or 0.85 m/s    Time 4    Period Weeks    Status Achieved    Target Date 09/16/20  PT Long Term Goals - 12/09/20 1605       PT LONG TERM GOAL #1   Title Pt will be independent with final HEP and walking program at [place] Patient [facility] returnes to [facility] and performing chair yoga    Time 4    Period Weeks    Status Achieved    Target Date 01/06/21      PT LONG TERM GOAL #2   Title Pt will negotiate 16 stairs with one rail, alternating sequence MOD I    Baseline 10/19/20 Patient able to negotiate 16 steps with step throug pattern with B rails    Time 4    Period Weeks    Status Partially Met    Target Date 01/06/21      PT LONG TERM GOAL #3   Title Pt will demonstrate symmetrical hamstring length and hip extension  (Thomas test) L to R side    Baseline 10/19/20 B hamstring flexibility esentiall equal, 90d B SLR, Thomas test ROM also equal upon observation    Time 8    Period Weeks    Status Achieved    Target Date 01/06/21      PT LONG TERM GOAL #4   Title Pt will decrease five time sit to stand to </=13 seconds to indicate decreased falls risk    Baseline 10/19/20 5x STS in 12.6s    Time 4    Period Weeks    Status Achieved      PT LONG TERM GOAL #5   Title Pt will increase gait velocity to >/= 3.0 ft/sec without AD    Baseline 10/19/20 gait velocity 0.68 m/s or 2.23 ft/s    Time 4    Period Weeks    Status Partially Met    Target Date 01/06/21                   Plan - 12/15/20 1424     Clinical Impression Statement Assessment shows posterior rotation of L ilium and resultant apparent shortened LLE and retricted L SI rotation in FF. Applied MET which returned pelvis to symmetrical posiiton and followed through with core stabilization tasks and lunge exercises for strengthening of lumbopelvic junction.    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    Examination-Activity Limitations Bend;Locomotion Level;Stairs;Stand    Examination-Participation Restrictions Community Activity;Other   Fitness   Stability/Clinical Decision Making Evolving/Moderate complexity    Rehab Potential Good    PT Frequency 2x / week    PT Duration 4 weeks    PT Treatment/Interventions ADLs/Self Care Home Management;Aquatic Therapy;DME Instruction;Gait training;Stair training;Functional mobility  training;Therapeutic activities;Therapeutic exercise;Balance training;Neuromuscular re-education;Patient/family education;Orthotic Fit/Training;Cryotherapy;Moist Heat;Manual techniques;Passive range of motion;Dry needling;Taping    PT Next Visit Plan follow up on SI alignment, pain and lumbar intersegmental mobility    PT Home Exercise Plan [email or web address]    Consulted and Agree with Plan of Care Patient             Patient will benefit from skilled therapeutic intervention in order to improve the following deficits and impairments:  Decreased activity tolerance, Decreased balance, Cardiopulmonary status limiting activity, Decreased endurance, Decreased range  of motion, Decreased strength, Difficulty walking, Impaired sensation, Postural dysfunction, Pain  Visit Diagnosis: Sciatica, left side  Muscle weakness (generalized)  Pain in left leg     Problem List Patient Active Problem List   Diagnosis Date Noted   Hereditary and idiopathic neuropathy, unspecified 11/20/2020   Hyperglycemia due to type 2 diabetes mellitus ([place]) 11/20/2020   Obesity 11/20/2020   Pure hypercholesterolemia 11/20/2020   Syncope 05/26/2020   Back pain 50/56/9794   Acute metabolic encephalopathy 80/16/5537   Lumbar spinal stenosis 08/15/2019   Nonspecific chest pain 08/11/2019   Shortness of breath    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 ([place]) 10/21/2018   Nocturnal hypoxemia 10/21/2018   Radiation therapy complication 48/27/0786   Primary prostate cancer ([place]) 07/31/2017   Anemia 07/31/2017   Vertigo due to cerebrovascular disease 07/31/2017   Poor compliance with CPAP treatment 07/31/2017   Absolute anemia 05/16/2017   Avitaminosis D 05/16/2017   Benign essential HTN 05/16/2017   Benign prostatic hypertrophy without urinary obstruction 05/16/2017   Clinical depression 05/16/2017   CN (constipation) 05/16/2017   Current drug use 05/16/2017   Diabetic neuropathy ([place]) 05/16/2017    Genital herpes 05/16/2017   Infarction of lung due to iatrogenic pulmonary embolism ([place]) 05/16/2017   Sciatica associated with disorder of lumbar spine 05/16/2017   Arteriosclerosis of coronary artery 05/16/2017   Artery disease, cerebral 05/16/2017   Apnea, sleep 05/16/2017   Arthralgia of hip or thigh 05/16/2017   Malignant neoplasm of prostate ([place]) 09/28/2016   Vascular dementia in remission ([place]) 09/28/2016   Remote history of stroke 09/28/2016   Acute kidney injury ([place]) 05/18/2016   Dehydration 05/18/2016   Near syncope 05/18/2016   Orthostatic hypotension 05/18/2016   CKD (chronic kidney disease), stage II 04/26/2016   UTI (urinary tract infection) 04/26/2016   Encounter for counseling on use of CPAP 11/18/2015   Chronic cholecystitis with calculus 11/02/2015   Stroke, 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[place]) 04/22/2015   TIA (transient ischemic attack) 11/03/2014   OSA on CPAP 05/18/2014   White matter disease 08/14/2013   Abnormal x-ray of temporomandibular joint 05/30/2013   Chronic infection of sinus 05/30/2013   Cough 05/30/2013   Fatigue 05/30/2013   Unsteady gait 05/19/2013   Combined fat and carbohydrate induced hyperlipemia 04/24/2013   Syncope 03/20/2013   Angina pectoris ([place]) 10/08/2012   Hypertension    Stroke ([place])    History of TIAs    Lumbago    Other and unspecified hyperlipidemia    Personal history of unspecified circulatory disease    Unwitnessed fall    Pain in joint, multiple sites    Degeneration of cervical intervertebral disc    Unspecified cardiovascular disease    History of pulmonary embolism: June 2014,  Takes Xarelto 07/01/2012    Class: History of   Hemiparesis ([place]) 07/18/2011   Diabetes mellitus type 2 with complications ([place]) 75/44/9201   CAD in native artery 07/18/2011   History of recurrent TIAs 07/18/2011    Lanice Shirts, PT 12/15/2020, 2:38 PM  [facility] [address] [place], [place], [address] Phone: [phone]   Fax:  [phone]  Name: Steve Brown MRN: [id number] Date of Birth: 1940-07-24

## 2020-12-16 ENCOUNTER — Other Ambulatory Visit (HOSPITAL_COMMUNITY): Payer: Self-pay | Admitting: Interventional Radiology

## 2020-12-16 DIAGNOSIS — G453 Amaurosis fugax: Secondary | ICD-10-CM

## 2020-12-17 DIAGNOSIS — H903 Sensorineural hearing loss, bilateral: Secondary | ICD-10-CM | POA: Diagnosis not present

## 2020-12-17 DIAGNOSIS — J323 Chronic sphenoidal sinusitis: Secondary | ICD-10-CM | POA: Diagnosis not present

## 2020-12-17 DIAGNOSIS — R42 Dizziness and giddiness: Secondary | ICD-10-CM | POA: Diagnosis not present

## 2020-12-17 DIAGNOSIS — H532 Diplopia: Secondary | ICD-10-CM | POA: Diagnosis not present

## 2020-12-20 ENCOUNTER — Telehealth (HOSPITAL_COMMUNITY): Payer: Self-pay

## 2020-12-20 DIAGNOSIS — H02834 Dermatochalasis of left upper eyelid: Secondary | ICD-10-CM | POA: Diagnosis not present

## 2020-12-20 DIAGNOSIS — H02831 Dermatochalasis of right upper eyelid: Secondary | ICD-10-CM | POA: Diagnosis not present

## 2020-12-20 DIAGNOSIS — H53483 Generalized contraction of visual field, bilateral: Secondary | ICD-10-CM | POA: Diagnosis not present

## 2020-12-20 DIAGNOSIS — E782 Mixed hyperlipidemia: Secondary | ICD-10-CM | POA: Diagnosis not present

## 2020-12-20 DIAGNOSIS — N183 Chronic kidney disease, stage 3 unspecified: Secondary | ICD-10-CM | POA: Diagnosis not present

## 2020-12-20 DIAGNOSIS — N400 Enlarged prostate without lower urinary tract symptoms: Secondary | ICD-10-CM | POA: Diagnosis not present

## 2020-12-20 DIAGNOSIS — F329 Major depressive disorder, single episode, unspecified: Secondary | ICD-10-CM | POA: Diagnosis not present

## 2020-12-20 DIAGNOSIS — I10 Essential (primary) hypertension: Secondary | ICD-10-CM | POA: Diagnosis not present

## 2020-12-20 DIAGNOSIS — I2510 Atherosclerotic heart disease of native coronary artery without angina pectoris: Secondary | ICD-10-CM | POA: Diagnosis not present

## 2020-12-20 DIAGNOSIS — H02413 Mechanical ptosis of bilateral eyelids: Secondary | ICD-10-CM | POA: Diagnosis not present

## 2020-12-20 DIAGNOSIS — K219 Gastro-esophageal reflux disease without esophagitis: Secondary | ICD-10-CM | POA: Diagnosis not present

## 2020-12-20 DIAGNOSIS — E11319 Type 2 diabetes mellitus with unspecified diabetic retinopathy without macular edema: Secondary | ICD-10-CM | POA: Diagnosis not present

## 2020-12-20 DIAGNOSIS — E1140 Type 2 diabetes mellitus with diabetic neuropathy, unspecified: Secondary | ICD-10-CM | POA: Diagnosis not present

## 2020-12-20 DIAGNOSIS — H57813 Brow ptosis, bilateral: Secondary | ICD-10-CM | POA: Diagnosis not present

## 2020-12-20 DIAGNOSIS — H02423 Myogenic ptosis of bilateral eyelids: Secondary | ICD-10-CM | POA: Diagnosis not present

## 2020-12-20 DIAGNOSIS — D649 Anemia, unspecified: Secondary | ICD-10-CM | POA: Diagnosis not present

## 2020-12-20 NOTE — Telephone Encounter (Signed)
Called to schedule diagnostic angiogram, no answer, left vm. AW  

## 2020-12-24 ENCOUNTER — Ambulatory Visit: Payer: Medicare Other

## 2020-12-29 ENCOUNTER — Ambulatory Visit: Payer: Medicare Other

## 2020-12-29 ENCOUNTER — Other Ambulatory Visit: Payer: Self-pay

## 2020-12-29 DIAGNOSIS — M6281 Muscle weakness (generalized): Secondary | ICD-10-CM

## 2020-12-29 DIAGNOSIS — M79605 Pain in left leg: Secondary | ICD-10-CM | POA: Diagnosis not present

## 2020-12-29 DIAGNOSIS — M5432 Sciatica, left side: Secondary | ICD-10-CM

## 2020-12-29 DIAGNOSIS — R2681 Unsteadiness on feet: Secondary | ICD-10-CM | POA: Diagnosis not present

## 2020-12-29 NOTE — Therapy (Signed)
[place], [place], [address] Phone: [phone]   Fax:  [phone]  Physical Therapy Treatment  Patient Details  Name: Steve Brown MRN: [id number] Date of Birth: [birth year] Referring Provider (PT): Lawerance Cruel, MD   Encounter Date: 12/29/2020   PT End of Session - 12/29/20 1250     Visit Number 19    Number of Visits 25    Date for PT Re-Evaluation 01/06/21    Authorization Type Medicare A&B; 10th visit PN    Authorization Time Period 11/26-12/29    Progress Note Due on Visit 78    PT Start Time 1130    PT Stop Time 1215    PT Time Calculation (min) 45 min    Equipment Utilized During Treatment Gait belt    Activity Tolerance Patient tolerated treatment well    Behavior During Therapy WFL for tasks assessed/performed             Past Medical History:  Diagnosis Date   Abnormal prostate biopsy    Anticoagulant long-term use    currently xarelto   BPH with elevated PSA    CKD (chronic kidney disease), stage II    Complication of anesthesia    limted neck rom limited use of left arm due to cva   Coronary artery disease    CARDIOLOGIST-  DR Irish Lack--  2010-- PCI w/ stenting midLAD   DDD (degenerative disc disease), lumbar    Degeneration of cervical intervertebral disc    Depression    Diabetes mellitus without complication (HCC)    Dyspnea on exertion    GERD (gastroesophageal reflux disease)    Hemiparesis due to cerebral infarction    History of cerebrovascular accident (CVA) with residual deficit 2002 and 2003--  hemiparisis both sides   per MRI  anterior left frontal lobe, left para midline pons, and inferior cerebullam bilaterally infarcts   History of pulmonary embolus (PE)    06-30-2012  extensive bilaterally   History of recurrent TIAs    History of syncope    hx multiple pre-syncope and syncopal episodes due to vasovagal, orthostatic hypotension, dehydration    History of TIAs    several since 2002   Hyperlipidemia    Hypertension    Mild atherosclerosis of carotid artery, bilateral    per last duplex 11-04-2014  bilateral ICA 1--39%   Neuropathy    fingers   OSA on CPAP    followed by dr dohmeier--  sev. osa w/ AHI 65.9   Prostate cancer ([place]) dx 2018   Renal insufficiency    S/P coronary artery stent placement 2010   stenting to mid LAD   Simple renal cyst    bilaterally   Stroke [facility])    Trigger finger of both hands 11-17-13   Type 2 diabetes mellitus ([place]) dx 1986   last one A1c 9.2 on 04-26-2016   Unsteady gait    . Hx prior CVA/TIAs;   Vertebral artery occlusion, left    chronic    Past Surgical History:  Procedure Laterality Date   ANTERIOR CERVICAL DECOMP/DISCECTOMY FUSION  2004   C3 -- C6 limited rom   CARDIAC CATHETERIZATION  06-10-2010   dr Irish Lack   wide patent LAD stent, mid lesion at the origin of the septal prior to the previous stent 40-50%/  normal LVF, ef 55%   CARDIOVASCULAR STRESS TEST  10-23-2012  dr Irish Lack   normal nuclear perfusion study w/ no ischemia/  normal  LV function and wall motion , ef 65%   CARPAL TUNNEL RELEASE Bilateral    CATARACT EXTRACTION W/ INTRAOCULAR LENS  IMPLANT, BILATERAL     CHOLECYSTECTOMY N/A 11/02/2015   Procedure: LAPAROSCOPIC CHOLECYSTECTOMY WITH INTRAOPERATIVE CHOLANGIOGRAM;  Surgeon: Donnie Mesa, MD;  Location: [place];  Service: General;  Laterality: N/A;   COLONOSCOPY     CORONARY ANGIOPLASTY WITH STENT PLACEMENT  02/2008   stenting to mid LAD   GOLD SEED IMPLANT N/A 11/15/2016   Procedure: GOLD SEED IMPLANT [place];  Surgeon: Ardis Hughs, MD;  Location[facility];  Service: Urology;  Laterality: N/A;   IR ANGIO INTRA EXTRACRAN SEL COM CAROTID INNOMINATE BILAT MOD SED  06/13/2018   IR ANGIO VERTEBRAL SEL VERTEBRAL UNI R MOD SED  06/13/2018   IR US GUIDE VASC ACCESS RIGHT  06/13/2018   LEFT HEART CATH AND CORONARY ANGIOGRAPHY N/A 05/25/2017    Procedure: LEFT HEART CATH AND CORONARY ANGIOGRAPHY;  Surgeon: Jettie Booze, MD;  Location: [place] CV LAB;  Service: Cardiovascular;  Laterality: N/A;   LEFT HEART CATHETERIZATION WITH CORONARY ANGIOGRAM N/A 04/03/2013   Procedure: LEFT HEART CATHETERIZATION WITH CORONARY ANGIOGRAM;  Surgeon: Jettie Booze, MD;  Location: [facility] CATH LAB;  Service: Cardiovascular;  Laterality: N/A;  patent mLAD stent  w/ mild disease in remainder LAD and its branches;  mod. focal lesion midLCFx- FFR of lesion was negative for ischemia/  normal LVSF, ef 50%   lungs  2005   "fluid pumped off lungs"   NEUROPLASTY / TRANSPOSITION ULNAR NERVE AT ELBOW Right 2004   PROSTATE BIOPSY N/A 08/31/2016   Procedure: PROSTATE  BIOPSY TRANSRECTAL ULTRASONIC PROSTATE (TUBP);  Surgeon: Ardis Hughs, MD;  Location: [facility];  Service: Urology;  Laterality: N/A;   SPACE OAR INSTILLATION N/A 11/15/2016   Procedure: SPACE OAR INSTILLATION;  Surgeon: Ardis Hughs, MD;  Location: [facility];  Service: Urology;  Laterality: N/A;   TRANSTHORACIC ECHOCARDIOGRAM  04/27/2016   severe focal basal LVH, ef 60-65%,  grade 2 diastoilc dysfunction/  mild AR, MR, and TR/  atrial septum lipomatous hypertrophy/  PASP [id number]   UMBILICAL HERNIA REPAIR      There were no vitals filed for this visit.   Subjective Assessment - 12/29/20 1132     Subjective Has had eye surgery last week but cleared to drive.  Continues to report intermitent bouts of dizzines, will have a CT scan tomorrow.  Has not been compliant with HEP.    Pertinent History Long-term anticoagulant use, BPH with elevated PSA, CKD, CAD, DDD lumbar, degeneration of cervical intervertebral disc, depression, DM, DOE, hemiparesis due to CVA, multiple CVA, ACDF C3-C6, bilat carpal tunnel release, PE, h/o syncope, h/o TIA, HLD, HTN, neuropathy, OSA, prostate CA, Vertebral artery occlusion, neuroplasty/transposition ulnar nn at elbow     Limitations Walking;Standing    How long can you sit comfortably? unimited    How long can you stand comfortably? <15 minutes    How long can you walk comfortably? <15 minutes    Patient Stated Goals To relieve LLE pain, improve walking    Pain Onset More than a month ago             L hip flexor stretch, 30s x2 Assessment of SI alignment finds posterior rotation of R ilium, shortened RLE, elevated R ASIS, depressed R PSIS MET to correct posterior rotation of R ilium  L hip flexor stretch with towel, 30s x3  Assessed B SI mobility with 30s PA pressure to B ASIS/PSIS 30s hold but results inconclusive Re-assessment of SI joint finds symmetrical alignment at end of session                           PT Short Term Goals - 12/09/20 1540       PT SHORT TERM GOAL #1   Title Pt will demonstrate independence with updated and revised HEP    Baseline Independent in current HEP    Time 4    Period Weeks    Status Achieved    Target Date 09/16/20      PT SHORT TERM GOAL #2   Title Pt will demonstrate 10 degrees increase in L hamstring ROM and L hip flexor ROM    Baseline 60 deg hamstring, lacking 20 deg to neutral hip extension LLE Thomas test; 08/30/20 L hamstring ROM 90d in supine; 10/05/20 90d in supine    Time 4    Period Weeks    Status Achieved    Target Date 09/16/20      PT SHORT TERM GOAL #3   Title Pt will participate in stair negotiation assessment with LTG to be set    Baseline TBD; 10/05/20 Able to negotiate a full flight of steps w/o need of rails with step through pattern    Time 4    Period Weeks    Status Achieved    Target Date 09/16/20      PT SHORT TERM GOAL #4   Title Pt will decrease five time sit to stand to </= 15 seconds without UE support    Baseline 18.2 seconds; 10/05/20 10.9s    Time 4    Period Weeks    Status Achieved    Target Date 09/16/20      PT SHORT TERM GOAL #5   Title Pt will increase gait velocity to >/= 2.8 ft/sec  without AD and with decreased antalgic gait sequence    Baseline 2.46 ft/sec; 10/05/20 0.76 m/s or 2.49 ft/s; 10/29/18 Gait speed 2.33 ft/s; 12/09/20 2/79 ft/s or 0.85 m/s    Time 4    Period Weeks    Status Achieved    Target Date 09/16/20               PT Long Term Goals - 12/09/20 1605       PT LONG TERM GOAL #1   Title Pt will be independent with final HEP and walking program at [place] Patient has returnes to [facility] walking and performing chair yoga    Time 4    Period Weeks    Status Achieved    Target Date 01/06/21      PT LONG TERM GOAL #2   Title Pt will negotiate 16 stairs with one rail, alternating sequence MOD I    Baseline 10/19/20 Patient able to negotiate 16 steps with step throug pattern with B rails    Time 4    Period Weeks    Status Partially Met    Target Date 01/06/21      PT LONG TERM GOAL #3   Title Pt will demonstrate symmetrical hamstring length and hip extension (Thomas test) L to R side    Baseline 10/19/20 B hamstring flexibility esentiall equal, 90d B SLR, Thomas test ROM also equal upon observation    Time 8    Period Weeks    Status Achieved  Target Date 01/06/21      PT LONG TERM GOAL #4   Title Pt will decrease five time sit to stand to </=13 seconds to indicate decreased falls risk    Baseline 10/19/20 5x STS in 12.6s    Time 4    Period Weeks    Status Achieved      PT LONG TERM GOAL #5   Title Pt will increase gait velocity to >/= 3.0 ft/sec without AD    Baseline 10/19/20 gait velocity 0.68 m/s or 2.23 ft/s    Time 4    Period Weeks    Status Partially Met    Target Date 01/06/21                   Plan - 12/29/20 1254     Clinical Impression Statement Assessed B SI mobility with 30s PA pressure to B ASIS/PSIS 30s hold but results inconclusive  Re-assessment of SI joint finds symmetrical alignment at end of session.  Re-inforced need to stretch L hip flexors and remain compliant with HEP    Personal  Factors and Comorbidities Comorbidity 3+;Past/Current Experience    Comorbidities Long-term anticoagulant use, BPH with elevated PSA, CKD, CAD, DDD lumbar, degeneration of cervical intervertebral disc, depression, DM, DOE, hemiparesis due to CVA, multiple CVA, ACDF C3-C6, bilat carpal tunnel release, PE, h/o syncope, h/o TIA, HLD, HTN, neuropathy, OSA, prostate CA, Vertebral artery occlusion, neuroplasty/transposition ulnar nn at elbow    Examination-Activity Limitations Bend;Locomotion Level;Stairs;Stand    Examination-Participation Restrictions Community Activity;Other   Fitness   Stability/Clinical Decision Making Evolving/Moderate complexity    Rehab Potential Good    PT Frequency 2x / week    PT Duration 4 weeks    PT Treatment/Interventions ADLs/Self Care Home Management;Aquatic Therapy;DME Instruction;Gait training;Stair training;Functional mobility training;Therapeutic activities;Therapeutic exercise;Balance training;Neuromuscular re-education;Patient/family education;Orthotic Fit/Training;Cryotherapy;Moist Heat;Manual techniques;Passive range of motion;Dry needling;Taping    PT Next Visit Plan follow up on SI alignment, HEP?, manual techniques    PT Home Exercise Plan [email or web address]    Consulted and Agree with Plan of Care Patient             Patient will benefit from skilled therapeutic intervention in order to improve the following deficits and impairments:  Decreased activity tolerance, Decreased balance, Cardiopulmonary status limiting activity, Decreased endurance, Decreased range of motion, Decreased strength, Difficulty walking, Impaired sensation, Postural dysfunction, Pain  Visit Diagnosis: Sciatica, left side  Muscle weakness (generalized)  Pain in left leg     Problem List Patient Active Problem List   Diagnosis Date Noted   Hereditary and idiopathic neuropathy, unspecified 11/20/2020   Hyperglycemia due to type 2 diabetes mellitus ([place]) 11/20/2020   Obesity  11/20/2020   Pure hypercholesterolemia 11/20/2020   Syncope 05/26/2020   Back pain 36/14/4315   Acute metabolic encephalopathy 40/08/6759   Lumbar spinal stenosis 08/15/2019   Nonspecific chest pain 08/11/2019   Shortness of breath    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 (HCC) 10/21/2018   Nocturnal hypoxemia 10/21/2018   Radiation therapy complication 95/09/3265   Primary prostate cancer ([place]) 07/31/2017   Anemia 07/31/2017   Vertigo due to cerebrovascular disease 07/31/2017   Poor compliance with CPAP treatment 07/31/2017   Absolute anemia 05/16/2017   Avitaminosis D 05/16/2017   Benign essential HTN 05/16/2017   Benign prostatic hypertrophy without urinary obstruction 05/16/2017   Clinical depression 05/16/2017   CN (constipation) 05/16/2017   Current drug use 05/16/2017   Diabetic neuropathy ([place]) 05/16/2017   Genital herpes 05/16/2017  Infarction of lung due to iatrogenic pulmonary embolism ([place]) 05/16/2017   Sciatica associated with disorder of lumbar spine 05/16/2017   Arteriosclerosis of coronary artery 05/16/2017   Artery disease, cerebral 05/16/2017   Apnea, sleep 05/16/2017   Arthralgia of hip or thigh 05/16/2017   Malignant neoplasm of prostate ([place]) 09/28/2016   Vascular dementia in remission ([place]) 09/28/2016   Remote history of stroke 09/28/2016   Acute kidney injury ([place]) 05/18/2016   Dehydration 05/18/2016   Near syncope 05/18/2016   Orthostatic hypotension 05/18/2016   CKD (chronic kidney disease), stage II 04/26/2016   UTI (urinary tract infection) 04/26/2016   Encounter for counseling on use of CPAP 11/18/2015   Chronic cholecystitis with calculus 11/02/2015   Stroke, 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[place]) 04/22/2015   TIA (transient ischemic attack) 11/03/2014   OSA on CPAP 05/18/2014   White matter disease 08/14/2013   Abnormal x-ray of temporomandibular joint 05/30/2013   Chronic infection of sinus 05/30/2013   Cough 05/30/2013   Fatigue  05/30/2013   Unsteady gait 05/19/2013   Combined fat and carbohydrate induced hyperlipemia 04/24/2013   Syncope 03/20/2013   Angina pectoris ([place]) 10/08/2012   Hypertension    Stroke ([place])    History of TIAs    Lumbago    Other and unspecified hyperlipidemia    Personal history of unspecified circulatory disease    Unwitnessed fall    Pain in joint, multiple sites    Degeneration of cervical intervertebral disc    Unspecified cardiovascular disease    History of pulmonary embolism: June 2014,  Takes Xarelto 07/01/2012    Class: History of   Hemiparesis ([place]) 07/18/2011   Diabetes mellitus type 2 with complications ([place]) 68/12/7515   CAD in native artery 07/18/2011   History of recurrent TIAs 07/18/2011    Lanice Shirts, PT 12/29/2020, 12:57 PM  [facility] [address] [place], [place], [address] Phone: [phone]   Fax:  [phone]  Name: Steve Brown MRN: [id number] Date of Birth: 1940/06/04

## 2020-12-30 ENCOUNTER — Other Ambulatory Visit: Payer: Self-pay | Admitting: Adult Health

## 2020-12-30 DIAGNOSIS — E1149 Type 2 diabetes mellitus with other diabetic neurological complication: Secondary | ICD-10-CM

## 2020-12-31 ENCOUNTER — Ambulatory Visit: Payer: Medicare Other

## 2021-01-03 ENCOUNTER — Ambulatory Visit
Admission: EM | Admit: 2021-01-03 | Discharge: 2021-01-03 | Disposition: A | Discharge status: Home / Self Care | Payer: Medicare Other | Attending: Internal Medicine | Admitting: Internal Medicine

## 2021-01-03 ENCOUNTER — Other Ambulatory Visit: Payer: Self-pay

## 2021-01-03 DIAGNOSIS — Z20822 Contact with and (suspected) exposure to covid-19: Secondary | ICD-10-CM

## 2021-01-03 NOTE — ED Triage Notes (Signed)
Pt here for rn visit covid test denies sx states exposure only. Collected sample without complication. Education provided.

## 2021-01-04 DIAGNOSIS — J323 Chronic sphenoidal sinusitis: Secondary | ICD-10-CM | POA: Diagnosis not present

## 2021-01-04 DIAGNOSIS — J343 Hypertrophy of nasal turbinates: Secondary | ICD-10-CM | POA: Diagnosis not present

## 2021-01-04 LAB — SARS-COV-2, NAA 2 DAY TAT

## 2021-01-04 LAB — NOVEL CORONAVIRUS, NAA: SARS-CoV-2, NAA: NOT DETECTED

## 2021-01-13 ENCOUNTER — Telehealth: Payer: Self-pay | Admitting: Neurology

## 2021-01-17 ENCOUNTER — Ambulatory Visit: Payer: Medicare HMO | Attending: Family Medicine

## 2021-01-17 ENCOUNTER — Other Ambulatory Visit: Payer: Self-pay

## 2021-01-17 DIAGNOSIS — M5432 Sciatica, left side: Secondary | ICD-10-CM | POA: Insufficient documentation

## 2021-01-17 DIAGNOSIS — M6281 Muscle weakness (generalized): Secondary | ICD-10-CM | POA: Insufficient documentation

## 2021-01-17 DIAGNOSIS — R2681 Unsteadiness on feet: Secondary | ICD-10-CM | POA: Diagnosis present

## 2021-01-17 DIAGNOSIS — R208 Other disturbances of skin sensation: Secondary | ICD-10-CM | POA: Insufficient documentation

## 2021-01-17 DIAGNOSIS — M79605 Pain in left leg: Secondary | ICD-10-CM | POA: Insufficient documentation

## 2021-01-17 NOTE — Therapy (Signed)
[facility] °[address] °, [place], [address] °Phone: [phone]   Fax:  [phone] ° °Physical Therapy Treatment ° °Patient Details  °Name: Steve Brown °MRN: [id number] °Date of Birth: 03/31/1940 °Referring Provider (PT): Ross, Charles Alan, MD ° ° °Encounter Date: 01/17/2021 ° ° PT End of Session - 01/17/21 1128   ° ° Visit Number 20   ° Number of Visits 25   ° Date for PT Re-Evaluation 01/06/21   ° Authorization Type Medicare A&B; 10th visit PN   ° Authorization Time Period 11/26-12/29   ° Progress Note Due on Visit 20   ° PT Start Time 1130   ° PT Stop Time 1215   ° PT Time Calculation (min) 45 min   ° Equipment Utilized During Treatment Gait belt   ° Activity Tolerance Patient tolerated treatment well   ° Behavior During Therapy WFL for tasks assessed/performed   ° °  °  ° °  ° ° °Past Medical History:  °Diagnosis Date  ° Abnormal prostate biopsy   ° Anticoagulant long-term use   ° currently xarelto  ° BPH with elevated PSA   ° CKD (chronic kidney disease), stage II   ° Complication of anesthesia   ° limted neck rom limited use of left arm due to cva  ° Coronary artery disease   ° CARDIOLOGIST-  DR VARANASI--  2010-- PCI w/ stenting midLAD  ° DDD (degenerative disc disease), lumbar   ° Degeneration of cervical intervertebral disc   ° Depression   ° Diabetes mellitus without complication (HCC)   ° Dyspnea on exertion   ° GERD (gastroesophageal reflux disease)   ° Hemiparesis due to cerebral infarction   ° History of cerebrovascular accident (CVA) with residual deficit 2002 and 2003--  hemiparisis both sides  ° per MRI  anterior left frontal lobe, left para midline pons, and inferior cerebullam bilaterally infarcts  ° History of pulmonary embolus (PE)   ° 06-30-2012  extensive bilaterally  ° History of recurrent TIAs   ° History of syncope   ° hx multiple pre-syncope and syncopal episodes due to vasovagal, orthostatic hypotension, dehydration  °  History of TIAs   ° several since 2002  ° Hyperlipidemia   ° Hypertension   ° Mild atherosclerosis of carotid artery, bilateral   ° per last duplex 11-04-2014  bilateral ICA 1--39%  ° Neuropathy   ° fingers  ° OSA on CPAP   ° followed by dr dohmeier--  sev. osa w/ AHI 65.9  ° Prostate cancer (HCC) dx 2018  ° Renal insufficiency   ° S/P coronary artery stent placement 2010  ° stenting to mid LAD  ° Simple renal cyst   ° bilaterally  ° Stroke (HCC)   ° Trigger finger of both hands 11-17-13  ° Type 2 diabetes mellitus (HCC) dx 1986  ° last one A1c 9.2 on 04-26-2016  ° Unsteady gait   ° . Hx prior CVA/TIAs;  ° Vertebral artery occlusion, left   ° chronic  ° ° °Past Surgical History:  °Procedure Laterality Date  ° ANTERIOR CERVICAL DECOMP/DISCECTOMY FUSION  2004  ° C3 -- C6 limited rom  ° CARDIAC CATHETERIZATION  06-10-2010   dr varanasi  ° wide patent LAD stent, mid lesion at the origin of the septal prior to the previous stent 40-50%/  normal LVF, ef 55%  ° CARDIOVASCULAR STRESS TEST  10-23-2012  dr varanasi  ° normal nuclear perfusion study w/ no ischemia/  normal   LV function and wall motion , ef 65%  ° CARPAL TUNNEL RELEASE Bilateral   ° CATARACT EXTRACTION W/ INTRAOCULAR LENS  IMPLANT, BILATERAL    ° CHOLECYSTECTOMY N/A 11/02/2015  ° Procedure: LAPAROSCOPIC CHOLECYSTECTOMY WITH INTRAOPERATIVE CHOLANGIOGRAM;  Surgeon: Matthew Tsuei, MD;  Location: [facility];  Service: General;  Laterality: N/A;  ° COLONOSCOPY    ° CORONARY ANGIOPLASTY WITH STENT PLACEMENT  02/2008  ° stenting to mid LAD  ° GOLD SEED IMPLANT N/A 11/15/2016  ° Procedure: GOLD SEED IMPLANT TIMES THREE;  Surgeon: Herrick, Benjamin W, MD;  Location: [facility];  Service: Urology;  Laterality: N/A;  ° IR ANGIO INTRA EXTRACRAN SEL COM CAROTID INNOMINATE BILAT MOD SED  06/13/2018  ° IR ANGIO VERTEBRAL SEL VERTEBRAL UNI R MOD SED  06/13/2018  ° IR US GUIDE VASC ACCESS RIGHT  06/13/2018  ° LEFT HEART CATH AND CORONARY ANGIOGRAPHY N/A 05/25/2017  °  Procedure: LEFT HEART CATH AND CORONARY ANGIOGRAPHY;  Surgeon: Varanasi, Jayadeep S, MD;  Location: [facility];  Service: Cardiovascular;  Laterality: N/A;  ° LEFT HEART CATHETERIZATION WITH CORONARY ANGIOGRAM N/A 04/03/2013  ° Procedure: LEFT HEART CATHETERIZATION WITH CORONARY ANGIOGRAM;  Surgeon: Jayadeep S Varanasi, MD;  Location: [facility]ATH [facility];  Service: Cardiovascular;  Laterality: N/A;  patent mLAD stent  w/ mild disease in remainder LAD and its branches;  mod. focal lesion midLCFx- FFR of lesion was negative for ischemia/  normal LVSF, ef 50%  ° lungs  2005  ° "fluid pumped off lungs"  ° NEUROPLASTY / TRANSPOSITION ULNAR NERVE AT ELBOW Right 2004  ° PROSTATE BIOPSY N/A 08/31/2016  ° Procedure: PROSTATE  BIOPSY TRANSRECTAL ULTRASONIC PROSTATE (TUBP);  Surgeon: Herrick, Benjamin W, MD;  Location: [facility];  Service: Urology;  Laterality: N/A;  ° SPACE OAR INSTILLATION N/A 11/15/2016  ° Procedure: SPACE OAR INSTILLATION;  Surgeon: Herrick, Benjamin W, MD;  Location: [facility];  Service: Urology;  Laterality: N/A;  ° TRANSTHORACIC ECHOCARDIOGRAM  04/27/2016  ° severe focal basal LVH, ef 60-65%,  grade 2 diastoilc dysfunction/  mild AR, MR, and TR/  atrial septum lipomatous hypertrophy/  PASP 42mmHg  ° UMBILICAL HERNIA REPAIR    ° ° °There were no vitals filed for this visit. ° ° Subjective Assessment - 01/17/21 1136   ° ° Subjective Continues with L sciatic pain with symptoms confined to anterior L thigh and hip flexors.  Symptoms not improving over time   ° Pertinent History Long-term anticoagulant use, BPH with elevated PSA, CKD, CAD, DDD lumbar, degeneration of cervical intervertebral disc, depression, DM, DOE, hemiparesis due to CVA, multiple CVA, ACDF C3-C6, bilat carpal tunnel release, PE, h/o syncope, h/o TIA, HLD, HTN, neuropathy, OSA, prostate CA, Vertebral artery occlusion, neuroplasty/transposition ulnar nn at elbow   ° Limitations Walking;Standing   ° How long  can you sit comfortably? unimited   ° How long can you stand comfortably? <15 minutes   ° How long can you walk comfortably? <15 minutes   ° Patient Stated Goals To relieve LLE pain, improve walking   ° Pain Onset More than a month ago   ° °  °  ° °  ° °Performed B Thomas test with positive findings on L, B hamstring flexibility is good °Continued symptoms in L hip and sacral region, unrelenting with HEP and stertching. Agreeable to dry needling to relive symptoms. Patient presents with anterior rotation of L ilium resulting in R leg appearing longer. Palpation finds point tenderness   to L hip flexors and psoas muscle. Stertching and METs have been unsuccessful to this point. Patient has endured some medical setbacks and additional time is needed to address ongoing impairments ° ° ° ° ° ° ° ° ° ° ° ° ° ° ° ° ° ° ° ° ° ° ° ° ° ° ° ° PT Short Term Goals - 12/09/20 1540   ° °  ° PT SHORT TERM GOAL #1  ° Title Pt will demonstrate independence with updated and revised HEP   ° Baseline Independent in current HEP   ° Time 4   ° Period Weeks   ° Status Achieved   ° Target Date 09/16/20   °  ° PT SHORT TERM GOAL #2  ° Title Pt will demonstrate 10 degrees increase in L hamstring ROM and L hip flexor ROM   ° Baseline 60 deg hamstring, lacking 20 deg to neutral hip extension LLE Thomas test; 08/30/20 L hamstring ROM 90d in supine; 10/05/20 90d in supine   ° Time 4   ° Period Weeks   ° Status Achieved   ° Target Date 09/16/20   °  ° PT SHORT TERM GOAL #3  ° Title Pt will participate in stair negotiation assessment with LTG to be set   ° Baseline TBD; 10/05/20 Able to negotiate a full flight of steps w/o need of rails with step through pattern   ° Time 4   ° Period Weeks   ° Status Achieved   ° Target Date 09/16/20   °  ° PT SHORT TERM GOAL #4  ° Title Pt will decrease five time sit to stand to </= 15 seconds without UE support   ° Baseline 18.2 seconds; 10/05/20 10.9s   ° Time 4   ° Period Weeks   ° Status Achieved   ° Target Date  09/16/20   °  ° PT SHORT TERM GOAL #5  ° Title Pt will increase gait velocity to >/= 2.8 ft/sec without AD and with decreased antalgic gait sequence   ° Baseline 2.46 ft/sec; 10/05/20 0.76 m/s or 2.49 ft/s; 10/29/18 Gait speed 2.33 ft/s; 12/09/20 2/79 ft/s or 0.85 m/s   ° Time 4   ° Period Weeks   ° Status Achieved   ° Target Date 09/16/20   ° °  °  ° °  ° ° ° ° PT Long Term Goals - 01/17/21 1142   ° °  ° PT LONG TERM GOAL #1  ° Title Pt will be independent with final HEP and walking program at [facility]   ° Baseline Patient has returnes to [facility] walking and performing chair yoga   ° Time 4   ° Period Weeks   ° Status Achieved   ° Target Date 01/06/21   °  ° PT LONG TERM GOAL #2  ° Title Pt will negotiate 16 stairs with one rail, alternating sequence MOD I   ° Baseline 10/19/20 Patient able to negotiate 16 steps with step throug pattern with B rails; 01/17/21 Able to negotiate 16 steps with step through patteren, verblly onfirmed.   ° Time 4   ° Period Weeks   ° Status Achieved   ° Target Date 01/06/21   °  ° PT LONG TERM GOAL #3  ° Title Pt will demonstrate symmetrical hamstring length and hip extension (Thomas test) L to R side   ° Baseline 10/19/20 B hamstring flexibility esentiall equal, 90d B SLR, Thomas test ROM also equal upon   observation   ° Time 8   ° Period Weeks   ° Status Achieved   ° Target Date 01/06/21   °  ° PT LONG TERM GOAL #4  ° Title Pt will decrease five time sit to stand to </=13 seconds to indicate decreased falls risk   ° Baseline 10/19/20 5x STS in 12.6s   ° Time 4   ° Period Weeks   ° Status Achieved   °  ° PT LONG TERM GOAL #5  ° Title Pt will increase gait velocity to >/= 3.0 ft/sec without AD   ° Baseline 10/19/20 gait velocity 0.68 m/s or 2.23 ft/s   ° Time 4   ° Period Weeks   ° Status Partially Met   ° Target Date 02/14/21   °  ° Additional Long Term Goals  ° Additional Long Term Goals Yes   °  ° PT LONG TERM GOAL #6  ° Title Decrease L hip flexor/psoas tenderness from 3/5 to 1/5   ° Baseline  3/5 tendernes in L psoas   ° Time 4   ° Period Weeks   ° Target Date 02/14/21   ° °  °  ° °  ° ° ° 01/17/21 1156  °Plan  °Clinical Impression Statement Continued symptoms in L hip and sacral region, unrelenting with HEP and stertching.  Agreeable to dry needling to relive symptoms.  Patient presents with anterior rotation of L ilium resulting in R leg appearing longer.  Palpation finds point tenderness to L hip flexors and psoas muscle.  Stertching and METs have been unsuccessful to this point.  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  °Examination-Activity Limitations Bend;Locomotion Level;Stairs;Stand  °Examination-Participation Restrictions Community Activity;Other °(Fitness)  °Pt will benefit from skilled therapeutic intervention in order to improve on the following deficits Decreased activity tolerance;Decreased balance;Cardiopulmonary status limiting activity;Decreased endurance;Decreased range of motion;Decreased strength;Difficulty walking;Impaired sensation;Postural dysfunction;Pain  °Stability/Clinical Decision Making Evolving/Moderate complexity  °Rehab Potential Good  °PT Frequency 1x / week  °PT Duration 4 weeks  °PT Treatment/Interventions ADLs/Self Care Home Management;Aquatic Therapy;DME Instruction;Gait training;Stair training;Functional mobility training;Therapeutic activities;Therapeutic exercise;Balance training;Neuromuscular re-education;Patient/family education;Orthotic  Fit/Training;Cryotherapy;Moist Heat;Manual techniques;Passive range of motion;Dry needling;Taping  °PT Next Visit Plan Assess for dry needling of L psoas and hip flexors, f/u for HEP and benefit of dry needling  °PT Home Exercise Plan [id number]  °Consulted and Agree with Plan of Care Patient  ° ° ° ° ° ° ° ° ° °Patient will benefit from skilled therapeutic intervention in order to improve the following deficits and impairments:  Decreased activity tolerance, Decreased balance, Cardiopulmonary status limiting activity, Decreased endurance, Decreased range of motion, Decreased strength, Difficulty walking, Impaired sensation, Postural dysfunction, Pain ° °Visit Diagnosis: °Sciatica, left side - Plan: PT plan of care cert/re-cert ° °Muscle weakness (generalized) - Plan: PT plan of care cert/re-cert ° °Pain in left leg - Plan: PT plan of care cert/re-cert ° °Unsteadiness on feet - Plan: PT plan of care cert/re-cert ° ° ° ° °Problem List °Patient Active Problem List  ° Diagnosis Date Noted  ° Hereditary and idiopathic neuropathy, unspecified 11/20/2020  °   Hyperglycemia due to type 2 diabetes mellitus (HCC) 11/20/2020  ° Obesity 11/20/2020  ° Pure hypercholesterolemia 11/20/2020  ° Syncope 05/26/2020  ° Back pain 05/26/2020  ° Acute metabolic encephalopathy 05/26/2020  ° Lumbar spinal stenosis 08/15/2019  ° Nonspecific chest pain 08/11/2019  ° Shortness of breath   ° 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 (HCC) 10/21/2018  ° Nocturnal hypoxemia 10/21/2018  ° Radiation therapy complication 07/31/2017  ° Primary prostate cancer (HCC) 07/31/2017  ° Anemia 07/31/2017  ° Vertigo due to cerebrovascular disease 07/31/2017  ° Poor compliance with CPAP treatment 07/31/2017  ° Absolute anemia 05/16/2017  ° Avitaminosis D 05/16/2017  ° Benign essential HTN 05/16/2017  ° Benign prostatic hypertrophy without urinary obstruction 05/16/2017  ° Clinical depression 05/16/2017  ° CN (constipation) 05/16/2017  ° Current drug  use 05/16/2017  ° Diabetic neuropathy (HCC) 05/16/2017  ° Genital herpes 05/16/2017  ° Infarction of lung due to iatrogenic pulmonary embolism (HCC) 05/16/2017  ° Sciatica associated with disorder of lumbar spine 05/16/2017  ° Arteriosclerosis of coronary artery 05/16/2017  ° Artery disease, cerebral 05/16/2017  ° Apnea, sleep 05/16/2017  ° Arthralgia of hip or thigh 05/16/2017  ° Malignant neoplasm of prostate (HCC) 09/28/2016  ° Vascular dementia in remission (HCC) 09/28/2016  ° Remote history of stroke 09/28/2016  ° Acute kidney injury (HCC) 05/18/2016  ° Dehydration 05/18/2016  ° Near syncope 05/18/2016  ° Orthostatic hypotension 05/18/2016  ° CKD (chronic kidney disease), stage II 04/26/2016  ° UTI (urinary tract infection) 04/26/2016  ° Encounter for counseling on use of CPAP 11/18/2015  ° Chronic cholecystitis with calculus 11/02/2015  ° Stroke, 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HCC) 04/22/2015  ° TIA (transient ischemic attack) 11/03/2014  ° OSA on CPAP 05/18/2014  ° White matter disease 08/14/2013  ° Abnormal x-ray of temporomandibular joint 05/30/2013  ° Chronic infection of sinus 05/30/2013  ° Cough 05/30/2013  ° Fatigue 05/30/2013  ° Unsteady gait 05/19/2013  ° Combined fat and carbohydrate induced hyperlipemia 04/24/2013  ° Syncope 03/20/2013  ° Angina pectoris (HCC) 10/08/2012  ° Hypertension   ° Stroke (HCC)   ° History of TIAs   ° Lumbago   ° Other and unspecified hyperlipidemia   ° Personal history of unspecified circulatory disease   ° Unwitnessed fall   ° Pain in joint, multiple sites   ° Degeneration of cervical intervertebral disc   ° Unspecified cardiovascular disease   ° History of pulmonary embolism: June 2014,  Takes Xarelto 07/01/2012  °  Class: History of  ° Hemiparesis (HCC) 07/18/2011  ° Diabetes mellitus type 2 with complications (HCC) 07/18/2011  ° CAD in native artery 07/18/2011  ° History of recurrent TIAs 07/18/2011  ° ° ° M , PT °01/17/2021, 1:51 PM ° °[facility] °[address] °[place], [place], [address] °Phone: [phone]   Fax:  [phone] ° °Name: Steve Brown °MRN: [id number] °Date of Birth: 02/21/1940 ° ° ° °

## 2021-01-18 ENCOUNTER — Ambulatory Visit (INDEPENDENT_AMBULATORY_CARE_PROVIDER_SITE_OTHER): Payer: Medicare HMO | Admitting: Neurology

## 2021-01-18 ENCOUNTER — Encounter: Payer: 59 | Admitting: Adult Health

## 2021-01-18 ENCOUNTER — Other Ambulatory Visit: Payer: Self-pay | Admitting: Neurology

## 2021-01-18 ENCOUNTER — Encounter: Payer: Self-pay | Admitting: Neurology

## 2021-01-18 ENCOUNTER — Encounter: Payer: Self-pay | Admitting: Adult Health

## 2021-01-18 VITALS — Ht 65.0 in | Wt 207.6 lb

## 2021-01-18 DIAGNOSIS — G453 Amaurosis fugax: Secondary | ICD-10-CM

## 2021-01-18 DIAGNOSIS — H532 Diplopia: Secondary | ICD-10-CM | POA: Diagnosis not present

## 2021-01-18 DIAGNOSIS — I679 Cerebrovascular disease, unspecified: Secondary | ICD-10-CM | POA: Diagnosis not present

## 2021-01-18 DIAGNOSIS — Z9989 Dependence on other enabling machines and devices: Secondary | ICD-10-CM

## 2021-01-18 DIAGNOSIS — R42 Dizziness and giddiness: Secondary | ICD-10-CM

## 2021-01-18 DIAGNOSIS — G4733 Obstructive sleep apnea (adult) (pediatric): Secondary | ICD-10-CM

## 2021-01-18 NOTE — Progress Notes (Addendum)
PATIENT: Steve Brown DOB: 19-Dec-1940  REASON FOR VISIT:  Interval history : 01-18-2021:  Bilateral blindness, transient followed by horizontal diplopia, 11/ 2022.  Here for follow up after normal angio CT in ED. ED visit recently-  turns out ED visit was in November 2022- Followed by CT head and CT angiogram.   18 January 2021:  Mr Steve Brown is seen here as a sleep medicine patient , using his CPAP compliantly . See attached data sheet to today's visit.  The patient endorsed today the fatigue severity score at 38/ 63 points, epworth 4-5 points only. Compliance by days 86% average user time average user time 5 hours and 37 minutes on days used 75% also days he used his machine over 4 hours.  Set pressure at 13 cmH2O with 1 cm EPR, residual AHI is 3.2.  High air leak.  Possibly some central apneas present as well.   This is his last sleep study; IMPRESSION:  1. Complex, mostly Obstructive Sleep Apnea (OSA) a6t AHI of 48.1/  h - high severity.  2. Primary Snoring  3. Some Central and Mixed Sleep Apnea was present.    4. Prolonged Hypoxemia, most severe desaturation during REM sleep  ( see screen shots)     RECOMMENDATIONS:   1. Advise full-night, attended, CPAP titration study to optimize  therapy. He has done well on 13 cm water pressure, but may have  remained hypoxic under CPAP therapy. I will request oxygen  supplementation in that case.     I certify that I have reviewed the entire raw data recording  prior to the issuance of this report in accordance with the  Standards of Accreditation of the [place] [employer] (AASM)    Steve Seat, MD   09-07-2018   Interval history : ED visit recently-  turns out ED visit was in November 2022- Followed by CT head and CT angiogram.  I reviewed the medication and he is indeed on Xarelto.  20 mg daily.   He often complains of dizziness . Pt states he has had dizzness for 2-3 months-  but new is another  symptom- he was watching TV and couldn't see anything everything was black for 15 minutes and went to ER where he  had CT scan . This sounds much more like an amaurosis fugax, but affecting both eyes?  He couldn't get up from the recliner- once the vision came back ,he had diplopia for 15 minutes.  He has had CVAs in the past.  Has been diagnosed with vertigo related to CVA.  CT angio was negative, co-incidental  finding of chronic sinusitis.  Patient is on Eloquis and can't take additional ASA.   in short,  we knew that [name] has posterior circulation problems, he knew that he had a cerebellar stroke in the past and a vertebral artery occlusion.  This explains most of his dizziness especially when visible to general the diagnosis of cerebrovascular disease has been made in the year 2010 or 2011 for the first time.  Diplopia justifies looking into MG ab. He reports horizontal diplopia.   11-18-2022[facility]:  Brain: There is no mass, hemorrhage or extra-axial collection. There is generalized atrophy without lobar predilection. There is an old left cerebellar infarct. There is hypoattenuation of the periventricular white matter, most commonly indicating chronic ischemic microangiopathy.  Skull: The visualized skull base, calvarium and extracranial soft tissues are normal.  Sinuses/Orbits: Chronic opacification of the  right sphenoid sinus. The orbits are normal.  CTA NECK FINDINGS  SKELETON: There is no bony spinal canal stenosis. No lytic or blastic lesion.  OTHER NECK: Normal pharynx, larynx and major salivary glands. No cervical lymphadenopathy. Unremarkable thyroid gland.  UPPER CHEST: No pneumothorax or pleural effusion. No nodules or masses.  AORTIC ARCH:  There is calcific atherosclerosis of the aortic arch. There is no aneurysm, dissection or hemodynamically significant stenosis of the visualized portion of the aorta. Conventional 3 vessel  aortic branching pattern. The visualized proximal subclavian arteries are widely patent.  RIGHT CAROTID SYSTEM: No dissection, occlusion or aneurysm. There is calcific atherosclerosis extending into the proximal ICA, resulting in less than 50% stenosis.  LEFT CAROTID SYSTEM: No dissection, occlusion or aneurysm. There is calcific atherosclerosis extending into the proximal ICA, resulting in less than 50% stenosis.  VERTEBRAL ARTERIES: Right dominant configuration. Left vertebral artery is occluded at its origin with reconstitution of the distal V2 segment. The V3 segment is patent. Right vertebral artery is normal.  CTA HEAD FINDINGS  POSTERIOR CIRCULATION:  -Normal.  ANTERIOR CIRCULATION:  -- Normal.  VENOUS SINUSES: As permitted by contrast timing, patent.  ANATOMIC VARIANTS: None  Review of the MIP images confirms the above findings.  IMPRESSION: 1. No intracranial arterial occlusion or high-grade stenosis. 2. Occlusion of the left vertebral artery at its origin with reconstitution of the distal V2 segment.  Aortic Atherosclerosis (ICD10-I70.0).   Electronically Signed By: Steve Brown M.D. On: 11/26/2020 22:57  ENT office - Steve Provencal, PA-C sent at 01/11/2021 11:09 AM EST -----  His sinus CT scan demonstrates stable to slight worsening of chronic sinusitis. Given his recent changes in vision and worsening radiographic findings, I recommend follow up with one of my physician colleagues. I have also referred him to [facility] for dizziness and vision changes.  01-18-2021. His remote stroke left cerebellar was confirmed as originating near the PICA through vascular occlusion of the vertebral artery. Angiography confirms this indirect as cause of the balance problems.  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 Caroline Brown at [facility]. He reports  that his doctor had suddenly died, and  therefor no longer follows him. He wants his stroke care locally, but should get it from a STROKE specialist.   Interval history 07-16-2018, [name] was last seen in a virtual visit phone call more or less by Debbora Presto nurse practitioner on 06 Jun 2018.  She followed up for OSA and CPAP only.  In the meantime on June 13, 2018 Dr. Harrington Challenger initiated a angiography study for the patient who had complained about vertigo and dizziness and has known atherosclerotic disease of the carotid arteries coronary arteries and is also a renal insufficiency patient.  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  Recurrent TIAs and syncopes were also to be evaluated as well as a history of pulmonary emboli.  The patient presented to Dr. Talbert Forest more for angiography.  The angiogram angiography was performed at: Hospital.  Patient presented with a blood pressure of 554/90 7 mmHg, respiration was 16 a minute, BMI 33.2, review of systems was normal or nonenhanced.  The result report is very long the impression is that of retrograde opacification of the nondominant left vertebrobasilar junction from the right vertebral artery to the level of the left posterior inferior  cerebellar artery.  This is an indication of a proximal occlusion of the left vertebral artery.  The right middle cerebral artery on the right anterior cerebral artery showed normal drainage.  The right internal carotid artery had normal flow, the dominant right vertebral artery origin was widely patent.  Please note that the patient had a known vertebrobasilar insufficiency is a left cerebellar infarct which is fully explained by the angiographic findings.  06-18-2018; 4 days later Mr. Farrior presented to the emergency room at [facility] with chest pain.  He reported left arm pain for about 2 days no true chest pain but radiating into the left arm without diaphoresis or  shortness of breath.  His procedure had approached the vascular tree through the right arm.  An EKG was normal referred- 06-18-2018.  Troponin was negative.  He was not short of breath.   In short,  we knew that Mr. Jacque Garrels has posterior circulation problems, he knew that he had a cerebellar stroke in the past and a vertebral artery occlusion.  This explains most of his dizziness especially when visible to general the diagnosis of cerebrovascular disease has been made in the year 2010 or 2011 for the first time.  Meclizine is asymptomatic treatment can help to suppress some of the vertigo symptoms or dizziness symptoms it is never a cure.  He also has had physical therapy with Dr. Festus Holts ENT and has not felt that this has benefited his balance.  I also was able to dedicate a little bit of time to his compliance on CPAP, he has been using the machine 24 out of 30 days with 67% compliance until 08 July 2018 I wish he would have a more actual date which could look more compliant, CPAP is set at 13 cm pressure with 3 cm EPR and he has an AHI residual of 1.0/h which speaks for very good resolution of apnea he does have very high air leakage.  I would like for him to consider continuing to use the machine but for over 4 hours each night he has made significant effort since I last talked to him, his Epworth Sleepiness Scale is six-point out of 24 which also is reduced,  The machine is 79 years old and we will retest him and prescribe an auto-titration device.    Interval history from 31 July 2017 for Mr. Yadier Bramhall, a 81 year old African-American right-handed gentleman who is presenting with cognitive concerns concerns of vascular origin.  He has been diagnosed with a history of multi-infarct cerebrovascular disease, hypertension, diabetes, has been followed for CPAP compliance, was on chronic anticoagulation but no longer is able to take aspirin. He reports dizziness - lightheaded, not exercise  tolerant. Worse when standing up. Antivert was given by PCP.  This has to be seen in relation to changes in RBC and WBC - Dr Waymon Budge follows also his anticoagulants.    With CPAP he had for a while drastically improved his apnea count but he is not using the CPAP on a nightly basis.  He has now again nocturia and sleepiness, higher fatigue- see below.  CPAP Compliance for the last 30 days was only 30% and average of 1 hours 38 minutes, CPAP was set at 13 cm with 3 cm EPR he has a high air leak but the residual apnea count is only 2.2/h.  I am not sure how I can encourage him to use the machine more often.   MRIs of the brain on 23 July 2017.  These were ordered by Dr. Harrington Challenger, his primary care physician.  MRI brain with and without contrast showed a 4 mm right periventricular occipital focus of reduced diffusion likely a new stroke, older strokes affect the right cerebellar region, the left frontal lobe, patchy pontine and supratentorial white matter intensities.  There is also chronically occluded left vertebral artery.  Known is also that the patient has myelomalacia and his cervical spine MRI was done without contrast = It showed between C3 and the 6 cervical vertebrae and anterior cervical fusion with arthrodesis, he does not have canal stenosis but neuroforaminal narrowing on multiple levels.  This may also explain some arm pain or shoulder pain or even weakness.   His Mini-Mental status examination today was 26 out of 30 points, the Epworth sleepiness score 11 points, fatigue severity was high at 47 out of 63 possible points.  The patient also presented with a very low white blood cell count to his primary care physician WBC was only 3.8, RBC 4.0 hemoglobin 12.9 hematocrit 38 MCV 95 which is high, neutrophils 1.6K per microliter which is low.- has a diagnosis of non-diabetic retinopathy was a working diagnosis.  He is followed by Dr. Geroge Baseman, a podiatrist. He is not driving now.   Interval history  from 09/28/2016. Mr. Farias had recently seen his primary care physician, Dr. Harle Battiest, who ordered an MRI of the brain, this time the MRI was performed at a different location from his previous study from 10/ 2016 ( have been performed at [facility] and was interpreted by Dr. Lanora Manis). The study was interpreted by Dr. Marinus Maw.  Reading the reports it seems very similar. The new about a bilateral cerebellar hemispheres, left paramedian pons, the new about chronic sinusitis and diffuse white matter disease. Dr. Marinus Maw commented on a small area of encephalomalacia that he found slightly atypical in the left frontal lobe.  I was able to review both MRI side-by-side in the presence of the patient. I do not see a difference. I reviewed T2-weighted images and axial FLAIR.  I have the nature of seeing Mr. Withey today on 08/03/2016, he has achieved 80% compliance on his CPAP use was 5 hours and 4 minutes of daily use on average, his residual AHI is 1.3. He is using CPAP at a set pressure of 13 cm water with 3 cm EPR. He had become non compliant - and needed a new study for  Supplies and resetting of his CPAP>  He had undergone a new split-night titration study on 03/27/2016 also he had been followed in our sleep clinic since 2013. He has a history of multi-infarct cerebrovascular disease, hypertension, diabetes, and has been followed for CPAP compliance.  His baseline AHI was confirmed at 60.1 per hour AHI, he had prolonged hypoxemia saturation below 89% was 79 minutes.  He improved drastically with CPAP saturation equaled only 8 minutes, he had no PLMS, his AHI was reduced to 4.5. Sleep efficiency with CPAP was 98%! He still has nocturia 3-4 times, and he sleeps longer than he uses CPAP. Feels less fatigued.     HPI:  This patient has been established as a sleep patient in our clinic since 2013, has a history of multi-infarct, hypertension, diabetes and was evaluated for neuropathy in 2015, was followed for  CPAP compliance until last May when he was no longer compliant due to repeated sinus infections, hospitalizations etc. He stated today that he has not been able to reinitiate CPAP use since  March 2017.  His primary care physician, Dr. Harrington Challenger, re- referred now for memory concerns, in a patient with known multi-infarct history but with a sliding result on his minimal mental status examinations. A note from January 5 of this year states that the patient had scored 26 out of 29 points in a high school educated African-American gentleman age 56. The patient had suffered multiple strokes and ongoing presumed TIA events, image studies were quoted below. II suspected that this is a vascular or infarct related memory loss rather than Alzheimer's disease.  It was after review of his notes from Dr Steve Brown at [facility] that I noticed that he had just been reevaluated for new stroke at the end of last year including imaging studies. I do not need to repeat any. His cognitive deficits are  explained with a multi-infarct dementia.   UPDATE 05/18/14 Mr. Cuccia, who was diagnosed with severe sleep apnea at an AHI of 65.9,  has made  efforts to increase his CPAP compliance.  CPAP was initiated to reduce his secondary CVA risk.    He stated that he had some sinusitis problems that kept him from using CPAP and has just seen Dr. Ernesto Rutherford who prescribed no antibiotics after other measures had failed. He has COPD / Asthma and sees pulmonology. He is reluctant to use an anti-decongestant which I would also not recommend because it can  spike his blood pressure. He has Flonase which Dr. Harrington Challenger his primary care physician has prescribed for him.  He is now 185 pounds. He also reports that he has a hard time sleeping on his back. Therefore when he sleeps on his sides occasionally the pillow will push the mask off his face causing a leak. He reports that he does try to exercise daily. He has continued on Xarelto and aspirin for stroke  prevention. His primary care is managing his hypertension and hyperlipidemia. He denies any additional strokelike symptoms.     HISTORY HISTORY 08/14/13 (CD): Mr. Mckinnon is meanwhile 81 years old and has been staying active. He has controlled his weight he is physically active, he also has undergone a complete course of physical therapy since last visit. After out last visit, we had obtained an MRI of the brain without contrast and compared to the study from earlier the same year : there was no change in comparison to the May CT on this MRI dated 07-04-13 .  The patient has few scattered small vessel injuries and some lacunar infarcts. The main risk factor for these as hypertension and diabetes- Occupational therapy saw him for left arm pain. Additional risk factor is the patient's diabetes mellitus condition. I referred him for occupational therapy to get further insight in what may have caused his arm pain.   He noted that he could left pounds with the left 3 pounds with her right hand now there is still a significant difference .   resulting in the conduction study and EMG study which was then performed on 07-09-13, normal study no evidence of large fiber neuropathy in the left upper extremity deltoid, biceps, triceps and flexor carpi radialis as well as interosseus Muscles; no abnormal EMG activity.   He underwent a sleep study at [facility] : results from 08-07-13 reviewed today :   Mr. Hanrahan was diagnosed with severe sleep apnea at an AHI of 65.9. Lowest point of oxygen saturation was 70%. He was titrated to CPAP at 9 cm water he slept 92.7 minutes at that pressure of  which 23 minutes of REM sleep. The AHI was now 0.6 per hour. The previously very fragmented sleep architecture became now essentially normal. The technologist used a PIC all equal nasal mask. The patient is asked to return in about 50 days for a followup with the CPAP machine.   I also reviewed his long medication list : patient has been  on Xeralto for chronic anticoagulation.  REVIEW OF SYSTEMS: Out of a complete 14 system review of symptoms, the patient complains only of the following symptoms, and all other reviewed systems are negative.  "i cannot recall the Sunday sermon. Non compliant with CPAP- 11 month on and off.   ALLERGIES: Allergies  Allergen Reactions   Other Other (See Comments)    Per patient- cardiac cath dye-  "woke up during procedure hysterical."   Phenergan [Promethazine] Other (See Comments)    Mood changes    Iohexol Other (See Comments)    Patient refuses contrast after having a hysterical event in hospital //r ls spoke with patient    Iohexol    Phenergan [Promethazine]     Other reaction(s): syncope   Tramadol     HOME MEDICATIONS: Outpatient Medications Prior to Visit  Medication Sig Dispense Refill   acetaminophen (TYLENOL) 500 MG tablet Take 500 mg by mouth every 6 (six) hours as needed (pain).     albuterol (VENTOLIN HFA) 108 (90 Base) MCG/ACT inhaler Inhale 1-2 puffs into the lungs every 6 (six) hours as needed for wheezing or shortness of breath. 1 each 0   bisacodyl (DULCOLAX) 10 MG suppository Place 1 suppository (10 mg total) rectally daily as needed for moderate constipation. 12 suppository 0   diclofenac Sodium (VOLTAREN) 1 % GEL Apply 4 g topically 4 (four) times daily. 100 g 0   fluticasone (FLONASE) 50 MCG/ACT nasal spray Place 1 spray into both nostrils daily as needed for allergies. 16 g 0   furosemide (LASIX) 20 MG tablet Take 1-2 tablets (20-40 mg total) by mouth daily as needed for fluid. 30 tablet 0   hydrochlorothiazide (MICROZIDE) 12.5 MG capsule Take 1 capsule (12.5 mg total) by mouth daily. 30 capsule 0   insulin aspart (NOVOLOG) 100 UNIT/ML FlexPen Inject 5 Units into the skin 3 (three) times daily with meals. 15 mL 0   losartan (COZAAR) 100 MG tablet Take 1 tablet (100 mg total) by mouth daily. 30 tablet 0   methocarbamol (ROBAXIN) 500 MG tablet Take 1 tablet (500 mg  total) by mouth every 8 (eight) hours as needed for muscle spasms. 30 tablet 0   modafinil (PROVIGIL) 100 MG tablet Take 1 tablet (100 mg total) by mouth daily. 30 tablet 5   oxyCODONE (OXY IR/ROXICODONE) 5 MG immediate release tablet Take 1 tablet (5 mg total) by mouth every 6 (six) hours as needed for up to 6 doses for severe pain. 30 tablet 0   pantoprazole (PROTONIX) 40 MG tablet Take 1 tablet (40 mg total) by mouth every morning. 30 tablet 0   polyethylene glycol (MIRALAX / GLYCOLAX) 17 g packet Take 17 g by mouth daily. 14 each 0   pregabalin (LYRICA) 300 MG capsule Take 300 mg by mouth 2 (two) times daily.     rosuvastatin (CRESTOR) 20 MG tablet Take 1 tablet (20 mg total) by mouth daily. 90 tablet 3   sitaGLIPtin (JANUVIA) 50 MG tablet Take 1 tablet (50 mg total) by mouth daily. 30 tablet 0   TRESIBA FLEXTOUCH 100 UNIT/ML FlexTouch Pen Inject 34 Units into  the skin daily. 3 mL 0   XARELTO 20 MG TABS tablet Take 1 tablet (20 mg total) by mouth daily. 30 tablet 0   No facility-administered medications prior to visit.    PAST MEDICAL HISTORY: Past Medical History:  Diagnosis Date   Abnormal prostate biopsy    Anticoagulant long-term use    currently xarelto   BPH with elevated PSA    CKD (chronic kidney disease), stage II    Complication of anesthesia    limted neck rom limited use of left arm due to cva   Coronary artery disease    CARDIOLOGIST-  DR Irish Lack--  2010-- PCI w/ stenting midLAD   DDD (degenerative disc disease), lumbar    Degeneration of cervical intervertebral disc    Depression    Diabetes mellitus without complication (HCC)    Dyspnea on exertion    GERD (gastroesophageal reflux disease)    Hemiparesis due to cerebral infarction    History of cerebrovascular accident (CVA) with residual deficit 2002 and 2003--  hemiparisis both sides   per MRI  anterior left frontal lobe, left para midline pons, and inferior cerebullam bilaterally infarcts   History of  pulmonary embolus (PE)    06-30-2012  extensive bilaterally   History of recurrent TIAs    History of syncope    hx multiple pre-syncope and syncopal episodes due to vasovagal, orthostatic hypotension, dehydration   History of TIAs    several since 2002   Hyperlipidemia    Hypertension    Mild atherosclerosis of carotid artery, bilateral    per last duplex 11-04-2014  bilateral ICA 1--39%   Neuropathy    fingers   OSA on CPAP    followed by dr Ashika Apuzzo--  sev. osa w/ AHI 65.9   Prostate cancer ([place]) dx 2018   Renal insufficiency    S/P coronary artery stent placement 2010   stenting to mid LAD   Simple renal cyst    bilaterally   Stroke [facility])    Trigger finger of both hands 11-17-13   Type 2 diabetes mellitus ([place]) dx 1986   last one A1c 9.2 on 04-26-2016   Unsteady gait    . Hx prior CVA/TIAs;   Vertebral artery occlusion, left    chronic    PAST SURGICAL HISTORY: Past Surgical History:  Procedure Laterality Date   ANTERIOR CERVICAL DECOMP/DISCECTOMY FUSION  2004   C3 -- C6 limited rom   CARDIAC CATHETERIZATION  06-10-2010   dr Irish Lack   wide patent LAD stent, mid lesion at the origin of the septal prior to the previous stent 40-50%/  normal LVF, ef 55%   CARDIOVASCULAR STRESS TEST  10-23-2012  dr Irish Lack   normal nuclear perfusion study w/ no ischemia/  normal LV function and wall motion , ef 65%   CARPAL TUNNEL RELEASE Bilateral    CATARACT EXTRACTION W/ INTRAOCULAR LENS  IMPLANT, BILATERAL     CHOLECYSTECTOMY N/A 11/02/2015   Procedure: LAPAROSCOPIC CHOLECYSTECTOMY WITH INTRAOPERATIVE CHOLANGIOGRAM;  Surgeon: Donnie Mesa, MD;  Location: [place];  Service: General;  Laterality: N/A;   COLONOSCOPY     CORONARY ANGIOPLASTY WITH STENT PLACEMENT  02/2008   stenting to mid LAD   GOLD SEED IMPLANT N/A 11/15/2016   Procedure: [place];  Surgeon: Ardis Hughs, MD;  Location[facility];  Service: Urology;  Laterality: N/A;   IR  ANGIO INTRA EXTRACRAN SEL COM CAROTID INNOMINATE BILAT MOD SED  06/13/2018   IR  ANGIO VERTEBRAL SEL VERTEBRAL UNI R MOD SED  06/13/2018   IR US GUIDE VASC ACCESS RIGHT  06/13/2018   LEFT HEART CATH AND CORONARY ANGIOGRAPHY N/A 05/25/2017   Procedure: LEFT HEART CATH AND CORONARY ANGIOGRAPHY;  Surgeon: Jettie Booze, MD;  Location: [place] [facility];  Service: Cardiovascular;  Laterality: N/A;   LEFT HEART CATHETERIZATION WITH CORONARY ANGIOGRAM N/A 04/03/2013   Procedure: LEFT HEART CATHETERIZATION WITH CORONARY ANGIOGRAM;  Surgeon: Jettie Booze, MD;  Location: [facility] CATH LAB;  Service: Cardiovascular;  Laterality: N/A;  patent mLAD stent  w/ mild disease in remainder LAD and its branches;  mod. focal lesion midLCFx- FFR of lesion was negative for ischemia/  normal LVSF, ef 50%   lungs  2005   "fluid pumped off lungs"   NEUROPLASTY / TRANSPOSITION ULNAR NERVE AT ELBOW Right 2004   PROSTATE BIOPSY N/A 08/31/2016   Procedure: PROSTATE  BIOPSY TRANSRECTAL ULTRASONIC PROSTATE (TUBP);  Surgeon: Ardis Hughs, MD;  Location: [facility];  Service: Urology;  Laterality: N/A;   SPACE OAR INSTILLATION N/A 11/15/2016   Procedure: SPACE OAR INSTILLATION;  Surgeon: Ardis Hughs, MD;  Location: [facility];  Service: Urology;  Laterality: N/A;   TRANSTHORACIC ECHOCARDIOGRAM  04/27/2016   severe focal basal LVH, ef 60-65%,  grade 2 diastoilc dysfunction/  mild AR, MR, and TR/  atrial septum lipomatous hypertrophy/  PASP [id number]   UMBILICAL HERNIA REPAIR      FAMILY HISTORY: Family History  Problem Relation Age of Onset   Aneurysm Mother    Cancer Father        unknown either pancreatic or prostate   Dementia Sister    Stroke Brother    Heart attack Neg Hx    Sleep apnea Neg Hx     SOCIAL HISTORY: Social History   Socioeconomic History   Marital status: Single    Spouse name: Not on file   Number of children: 4   Years of education: 12   Highest  education level: Not on file  Occupational History    Employer: RETIRED    Comment: retired  Tobacco Use   Smoking status: Never   Smokeless tobacco: Never  Vaping Use   Vaping Use: Never used  Substance and Sexual Activity   Alcohol use: Not Currently   Drug use: Never   Sexual activity: Not Currently  Other Topics Concern   Not on file  Social History Narrative   ** Merged History Encounter **       Patient lives at home alone and he is single.  Patient is retired.  Caffeine - one cups daily. Right handed. Patient has a high school education. Patient has four adult children.   Social Determinants of Health   Financial Resource Strain: Not on file  Food Insecurity: Not on file  Transportation Needs: Not on file  Physical Activity: Not on file  Stress: Not on file  Social Connections: Not on file  Intimate Partner Violence: Not on file    ROS : Bilateral blindness, transient followed by horizontal diplopia, 11/ 2022.  Here for follow up after normal angio CT in ED.   PHYSICAL EXAM  Vitals:   01/18/21 1117  Weight: 207 lb 9.6 oz (94.2 kg)  Height: 5\' 5"  (1.651 m)   Body mass index is 34.55 kg/m.  Generalized: Well developed, in no acute distress . Seems depressed.   Pale mucous lining.  No ankle edema , no rash.  Reddened eyes.   Neurological examination  Mentation: Alert oriented to time, place, history taking.   MMSE - Mini Mental State Exam 08/23/2020 07/31/2017  Orientation to time 5 5  Orientation to Place 5 5  Registration 3 3  Attention/ Calculation 1 3  Recall 3 2  Language- name 2 objects 2 2  Language- repeat 1 1  Language- follow 3 step command 3 3  Language- read & follow direction 1 1  Write a sentence 1 1  Copy design 0 0  Total score 25 26     Montreal Cognitive Assessment  02/21/2016  Visuospatial/ Executive (0/5) 3  Naming (0/3) 3  Attention: Read list of digits (0/2) 2  Attention: Read list of letters (0/1) 0  Attention:  Serial 7 subtraction starting at 100 (0/3) 0  Language: Repeat phrase (0/2) 2  Language : Fluency (0/1) 1  Abstraction (0/2) 2  Delayed Recall (0/5) 5  Orientation (0/6) 5  Total 23  Adjusted Score (based on education) 24    DIAGNOSTIC DATA (LABS, IMAGING, TESTING) - I reviewed patient records, labs, notes, testing and imaging myself where available.   Lab Results  Component Value Date   WBC 5.2 11/26/2020   HGB 13.7 11/26/2020   HCT 40.1 11/26/2020   MCV 93.5 11/26/2020   PLT 182 11/26/2020     General: The patient is awake, alert and appears in no acute distress. The patient is well groomed. CN; normal smell and taste are preserved.  Normocephalic, atraumatic.  Face is symmetric without facial masking.  Pupils are equal, round & reactive to light & accomodation.  Extraocular tracking  no nystagmus noted.  No diplopia here during examination-red lens test was applied.     Mallampati is class  3.  Tongue protrudes centrally & palate elevates symmetrically.  Neck:  Supple.ring grossly intact.  Speech is clear , mildly hypophonic with no dysarthria noted.   There is no lip, neck or jaw tremor.   Neck is mildly rigid.  Mental Status:  The patient is awake, alert, oriented . His memory, language & knowledge are appropriate.   Sensory:  Fine touch, pinprick, vibration & temperature sensation were decreased at ankle level.  Motor exam:   Normal tone and normal muscle bulk and symmetric normal strength in all extremities.  No resting tremor, no postural or action tremor.   Cerebellar testing:  No dysmetria or intention tremor on finger-to-nose testing.   Arms were extended fully, no pronator drift. There is no truncal or gait ataxia.   ASSESSMENT AND PLAN 81 y.o. year old male who has been followed for CPAP compliance in the sleep clinic of the [place] office,  has now been re- referred by ED .Bilateral blindness, transient followed by horizontal diplopia, 11/ 2022.  Here for follow up  after normal angio CT in ED.    Plan : No new findings, old cerebellar stroke.  Order for myasthenia ab.  I believe his diplopia can be related to his DM as well I can not add any anticoagulation on top of Xeralto.    OSA : OK compliance with CPAP. Machine from 2020.    He will from now on see the Nurse Practitoner  , next visit in 6 month, alternating with me .   Steve Seat, MD  01/18/2021, 11:25 AM [facility] [address] [place], [place] [address] [phone]

## 2021-01-19 LAB — ACETYLCHOLINE RECEPTOR, BINDING: AChR Binding Ab, Serum: 0.04 nmol/L (ref 0.00–0.24)

## 2021-01-19 NOTE — Progress Notes (Signed)
This encounter was created in error - please disregard.

## 2021-01-20 ENCOUNTER — Telehealth: Payer: Self-pay | Admitting: *Deleted

## 2021-01-20 NOTE — Telephone Encounter (Signed)
-----   Message from Larey Seat, MD sent at 01/20/2021 12:24 PM EST ----- Diplopia ( horizontal) evaluation after amaurosis fugax bilateral vision loss, transient :  . The myasthenia ab is negative. No need for immune suppression medication.   Cc dr Harrington Challenger.

## 2021-01-20 NOTE — Telephone Encounter (Signed)
LVM for pt about results. Asked him to call office back to schedule next f/u. Dr. Brett Fairy wanted to see him back around 07/18/21 w/ MM,NP

## 2021-01-20 NOTE — Progress Notes (Signed)
Diplopia ( horizontal) evaluation after amaurosis fugax bilateral vision loss, transient :  . The myasthenia ab is negative. No need for immune suppression medication.   Cc dr Harrington Challenger.

## 2021-01-24 NOTE — Telephone Encounter (Signed)
error 

## 2021-01-25 ENCOUNTER — Other Ambulatory Visit: Payer: Self-pay

## 2021-01-25 ENCOUNTER — Ambulatory Visit: Payer: Medicare HMO

## 2021-01-25 DIAGNOSIS — M5432 Sciatica, left side: Secondary | ICD-10-CM | POA: Diagnosis not present

## 2021-01-25 DIAGNOSIS — M79605 Pain in left leg: Secondary | ICD-10-CM

## 2021-01-25 DIAGNOSIS — R2681 Unsteadiness on feet: Secondary | ICD-10-CM

## 2021-01-25 DIAGNOSIS — R208 Other disturbances of skin sensation: Secondary | ICD-10-CM

## 2021-01-25 DIAGNOSIS — M6281 Muscle weakness (generalized): Secondary | ICD-10-CM

## 2021-01-25 NOTE — Therapy (Signed)
[place], [place], [address] Phone: [phone]   Fax:  [phone]  Physical Therapy Treatment/ Re-certification  Progress Note Reporting Period 11/12/2020 to 01/26/2020  See note below for Objective Data and Assessment of Progress/Goals.      Patient Details  Name: Steve Brown MRN: [id number] Date of Birth: 01/04/1941 Referring Provider (PT): Lawerance Cruel, MD   Encounter Date: 01/25/2021    Past Medical History:  Diagnosis Date   Abnormal prostate biopsy    Anticoagulant long-term use    currently xarelto   BPH with elevated PSA    CKD (chronic kidney disease), stage II    Complication of anesthesia    limted neck rom limited use of left arm due to cva   Coronary artery disease    CARDIOLOGIST-  DR Irish Lack--  2010-- PCI w/ stenting midLAD   DDD (degenerative disc disease), lumbar    Degeneration of cervical intervertebral disc    Depression    Diabetes mellitus without complication (HCC)    Dyspnea on exertion    GERD (gastroesophageal reflux disease)    Hemiparesis due to cerebral infarction    History of cerebrovascular accident (CVA) with residual deficit 2002 and 2003--  hemiparisis both sides   per MRI  anterior left frontal lobe, left para midline pons, and inferior cerebullam bilaterally infarcts   History of pulmonary embolus (PE)    06-30-2012  extensive bilaterally   History of recurrent TIAs    History of syncope    hx multiple pre-syncope and syncopal episodes due to vasovagal, orthostatic hypotension, dehydration   History of TIAs    several since 2002   Hyperlipidemia    Hypertension    Mild atherosclerosis of carotid artery, bilateral    per last duplex 11-04-2014  bilateral ICA 1--39%   Neuropathy    fingers   OSA on CPAP    followed by dr dohmeier--  sev. osa w/ AHI 65.9   Prostate cancer ([place]) dx 2018   Renal insufficiency    S/P coronary artery stent  placement 2010   stenting to mid LAD   Simple renal cyst    bilaterally   Stroke [facility])    Trigger finger of both hands 11-17-13   Type 2 diabetes mellitus ([place]) dx 1986   last one A1c 9.2 on 04-26-2016   Unsteady gait    . Hx prior CVA/TIAs;   Vertebral artery occlusion, left    chronic    Past Surgical History:  Procedure Laterality Date   ANTERIOR CERVICAL DECOMP/DISCECTOMY FUSION  2004   C3 -- C6 limited rom   CARDIAC CATHETERIZATION  06-10-2010   dr Irish Lack   wide patent LAD stent, mid lesion at the origin of the septal prior to the previous stent 40-50%/  normal LVF, ef 55%   CARDIOVASCULAR STRESS TEST  10-23-2012  dr Irish Lack   normal nuclear perfusion study w/ no ischemia/  normal LV function and wall motion , ef 65%   CARPAL TUNNEL RELEASE Bilateral    CATARACT EXTRACTION W/ INTRAOCULAR LENS  IMPLANT, BILATERAL     CHOLECYSTECTOMY N/A 11/02/2015   Procedure: LAPAROSCOPIC CHOLECYSTECTOMY WITH INTRAOPERATIVE CHOLANGIOGRAM;  Surgeon: Donnie Mesa, MD;  Location: [place];  Service: General;  Laterality: N/A;   COLONOSCOPY     CORONARY ANGIOPLASTY WITH STENT PLACEMENT  02/2008   stenting to mid LAD   GOLD SEED IMPLANT N/A 11/15/2016   Procedure: [place];  Surgeon: Louis Meckel,  Viona Gilmore, MD;  Location: [facility];  Service: Urology;  Laterality: N/A;   IR ANGIO INTRA EXTRACRAN SEL COM CAROTID INNOMINATE BILAT MOD SED  06/13/2018   IR ANGIO VERTEBRAL SEL VERTEBRAL UNI R MOD SED  06/13/2018   IR US GUIDE VASC ACCESS RIGHT  06/13/2018   LEFT HEART CATH AND CORONARY ANGIOGRAPHY N/A 05/25/2017   Procedure: LEFT HEART CATH AND CORONARY ANGIOGRAPHY;  Surgeon: Jettie Booze, MD;  Location: [place] CV LAB;  Service: Cardiovascular;  Laterality: N/A;   LEFT HEART CATHETERIZATION WITH CORONARY ANGIOGRAM N/A 04/03/2013   Procedure: LEFT HEART CATHETERIZATION WITH CORONARY ANGIOGRAM;  Surgeon: Jettie Booze, MD;  Location: [facility] CATH LAB;  Service:  Cardiovascular;  Laterality: N/A;  patent mLAD stent  w/ mild disease in remainder LAD and its branches;  mod. focal lesion midLCFx- FFR of lesion was negative for ischemia/  normal LVSF, ef 50%   lungs  2005   "fluid pumped off lungs"   NEUROPLASTY / TRANSPOSITION ULNAR NERVE AT ELBOW Right 2004   PROSTATE BIOPSY N/A 08/31/2016   Procedure: PROSTATE  BIOPSY TRANSRECTAL ULTRASONIC PROSTATE (TUBP);  Surgeon: Ardis Hughs, MD;  Location: [facility];  Service: Urology;  Laterality: N/A;   SPACE OAR INSTILLATION N/A 11/15/2016   Procedure: SPACE OAR INSTILLATION;  Surgeon: Ardis Hughs, MD;  Location: [facility];  Service: Urology;  Laterality: N/A;   TRANSTHORACIC ECHOCARDIOGRAM  04/27/2016   severe focal basal LVH, ef 60-65%,  grade 2 diastoilc dysfunction/  mild AR, MR, and TR/  atrial septum lipomatous hypertrophy/  PASP [id number]   UMBILICAL HERNIA REPAIR      There were no vitals filed for this visit.         [facility] Adult PT Treatment:                                                DATE: 01/25/2021 Therapeutic Exercise: Supine 90/90 abdominal isometric with handhold resistance 3x30sec Standing Pallof press with 7# cable 2x10 with 5-sec hold BIL Hooklying curl-ups 3x10 Hooklying lower trunk rotations Manual Therapy: Skilled palpation to lumbar paraspinals and QL prior to and following TPDN Effleurage to BIL lumbar paraspinals and QL Neuromuscular re-ed: N/A  Therapeutic Activity: N/A Modalities: N/A Self Care: N/A   Trigger Point Dry-Needling  Treatment instructions: Expect mild to moderate muscle soreness. S/S of pneumothorax if dry needled over a lung field, and to seek immediate medical attention should they occur. Patient verbalized understanding of these instructions and education.  Patient Consent Given: Yes Education handout provided: Yes Muscles treated: BIL lumbar multifidi L3-L4 Electrical stimulation performed:  No Parameters: N/A Treatment response/outcome: Twitch response elicited and Palpable decrease in muscle tension                         PT Short Term Goals - 12/09/20 1540       PT SHORT TERM GOAL #1   Title Pt will demonstrate independence with updated and revised HEP    Baseline Independent in current HEP    Time 4    Period Weeks    Status Achieved    Target Date 09/16/20      PT SHORT TERM GOAL #2   Title Pt will demonstrate 10 degrees increase in L hamstring ROM and L  hip flexor ROM    Baseline 60 deg hamstring, lacking 20 deg to neutral hip extension LLE Thomas test; 08/30/20 L hamstring ROM 90d in supine; 10/05/20 90d in supine    Time 4    Period Weeks    Status Achieved    Target Date 09/16/20      PT SHORT TERM GOAL #3   Title Pt will participate in stair negotiation assessment with LTG to be set    Baseline TBD; 10/05/20 Able to negotiate a full flight of steps w/o need of rails with step through pattern    Time 4    Period Weeks    Status Achieved    Target Date 09/16/20      PT SHORT TERM GOAL #4   Title Pt will decrease five time sit to stand to </= 15 seconds without UE support    Baseline 18.2 seconds; 10/05/20 10.9s    Time 4    Period Weeks    Status Achieved    Target Date 09/16/20      PT SHORT TERM GOAL #5   Title Pt will increase gait velocity to >/= 2.8 ft/sec without AD and with decreased antalgic gait sequence    Baseline 2.46 ft/sec; 10/05/20 0.76 m/s or 2.49 ft/s; 10/29/18 Gait speed 2.33 ft/s; 12/09/20 2/79 ft/s or 0.85 m/s    Time 4    Period Weeks    Status Achieved    Target Date 09/16/20               PT Long Term Goals - 01/17/21 1142       PT LONG TERM GOAL #1   Title Pt will be independent with final HEP and walking program at [place] Patient has returnes to [facility] walking and performing chair yoga    Time 4    Period Weeks    Status Achieved    Target Date 01/06/21      PT LONG TERM GOAL #2    Title Pt will negotiate 16 stairs with one rail, alternating sequence MOD I    Baseline 10/19/20 Patient able to negotiate 16 steps with step throug pattern with B rails; 01/17/21 Able to negotiate 16 steps with step through patteren, verblly onfirmed.    Time 4    Period Weeks    Status Achieved    Target Date 01/06/21      PT LONG TERM GOAL #3   Title Pt will demonstrate symmetrical hamstring length and hip extension (Thomas test) L to R side    Baseline 10/19/20 B hamstring flexibility esentiall equal, 90d B SLR, Thomas test ROM also equal upon observation    Time 8    Period Weeks    Status Achieved    Target Date 01/06/21      PT LONG TERM GOAL #4   Title Pt will decrease five time sit to stand to </=13 seconds to indicate decreased falls risk    Baseline 10/19/20 5x STS in 12.6s    Time 4    Period Weeks    Status Achieved      PT LONG TERM GOAL #5   Title Pt will increase gait velocity to >/= 3.0 ft/sec without AD    Baseline 10/19/20 gait velocity 0.68 m/s or 2.23 ft/s    Time 4    Period Weeks    Status Partially Met    Target Date 02/14/21      Additional Long Term Goals  Additional Long Term Goals Yes      PT LONG TERM GOAL #6   Title Decrease L hip flexor/psoas tenderness from 3/5 to 1/5    Baseline 3/5 tendernes in L psoas    Time 4    Period Weeks    Target Date 02/14/21                    Patient will benefit from skilled therapeutic intervention in order to improve the following deficits and impairments:     Visit Diagnosis: Sciatica, left side  Muscle weakness (generalized)  Pain in left leg  Unsteadiness on feet  Other disturbances of skin sensation     Problem List Patient Active Problem List   Diagnosis Date Noted   Amaurosis fugax, both eyes 01/18/2021   Transient diplopia 01/18/2021   Hereditary and idiopathic neuropathy, unspecified 11/20/2020   Hyperglycemia due to type 2 diabetes mellitus ([place]) 11/20/2020   Obesity  11/20/2020   Pure hypercholesterolemia 11/20/2020   Syncope 05/26/2020   Back pain 01/77/9390   Acute metabolic encephalopathy 30/09/2328   Lumbar spinal stenosis 08/15/2019   Nonspecific chest pain 08/11/2019   Shortness of breath    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 ([place]) 10/21/2018   Nocturnal hypoxemia 10/21/2018   Radiation therapy complication 07/62/2633   Primary prostate cancer ([place]) 07/31/2017   Anemia 07/31/2017   Vertigo due to cerebrovascular disease 07/31/2017   Poor compliance with CPAP treatment 07/31/2017   Absolute anemia 05/16/2017   Avitaminosis D 05/16/2017   Benign essential HTN 05/16/2017   Benign prostatic hypertrophy without urinary obstruction 05/16/2017   Clinical depression 05/16/2017   CN (constipation) 05/16/2017   Current drug use 05/16/2017   Diabetic neuropathy ([place]) 05/16/2017   Genital herpes 05/16/2017   Infarction of lung due to iatrogenic pulmonary embolism ([place]) 05/16/2017   Sciatica associated with disorder of lumbar spine 05/16/2017   Arteriosclerosis of coronary artery 05/16/2017   Artery disease, cerebral 05/16/2017   Apnea, sleep 05/16/2017   Arthralgia of hip or thigh 05/16/2017   Malignant neoplasm of prostate ([place]) 09/28/2016   Vascular dementia in remission ([place]) 09/28/2016   Remote history of stroke 09/28/2016   Acute kidney injury ([place]) 05/18/2016   Dehydration 05/18/2016   Near syncope 05/18/2016   Orthostatic hypotension 05/18/2016   CKD (chronic kidney disease), stage II 04/26/2016   UTI (urinary tract infection) 04/26/2016   Encounter for counseling on use of CPAP 11/18/2015   Chronic cholecystitis with calculus 11/02/2015   Stroke, 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[place]) 04/22/2015   TIA (transient ischemic attack) 11/03/2014   OSA on CPAP 05/18/2014   White matter disease 08/14/2013   Abnormal x-ray of temporomandibular joint 05/30/2013   Chronic infection of sinus 05/30/2013   Cough 05/30/2013   Fatigue  05/30/2013   Unsteady gait 05/19/2013   Combined fat and carbohydrate induced hyperlipemia 04/24/2013   Syncope 03/20/2013   Angina pectoris ([place]) 10/08/2012   Hypertension    Stroke ([place])    History of TIAs    Lumbago    Other and unspecified hyperlipidemia    Personal history of unspecified circulatory disease    Unwitnessed fall    Pain in joint, multiple sites    Degeneration of cervical intervertebral disc    Unspecified cardiovascular disease    History of pulmonary embolism: June 2014,  Takes Xarelto 07/01/2012    Class: History of   Hemiparesis ([place]) 07/18/2011   Diabetes mellitus type 2 with complications ([place]) 35/45/6256   CAD  in native artery 07/18/2011   History of recurrent TIAs 07/18/2011    Cherie Ouch, PT 01/25/2021, 12:18 PM  [facility] [address] [place], [place], [address] Phone: [phone]   Fax:  [phone]  Name: Nasiah Lehenbauer MRN: [id number] Date of Birth: 09/25/1940

## 2021-01-25 NOTE — Addendum Note (Signed)
Addended by: Jefm Petty on: 01/25/2021 01:08 PM   Modules accepted: Orders

## 2021-01-25 NOTE — Patient Instructions (Signed)

## 2021-02-01 ENCOUNTER — Ambulatory Visit: Payer: Medicare HMO

## 2021-02-01 ENCOUNTER — Other Ambulatory Visit: Payer: Self-pay

## 2021-02-01 DIAGNOSIS — M6281 Muscle weakness (generalized): Secondary | ICD-10-CM

## 2021-02-01 DIAGNOSIS — M5432 Sciatica, left side: Secondary | ICD-10-CM

## 2021-02-01 DIAGNOSIS — M79605 Pain in left leg: Secondary | ICD-10-CM

## 2021-02-01 NOTE — Therapy (Signed)
[facility] [place], [place], [address] Phone: [phone]   Fax:  [phone]  Physical Therapy Treatment  Patient Details  Name: Steve Brown MRN: [id number] Date of Birth: 03-21-1940 Referring Provider (PT): Lawerance Cruel, MD   Encounter Date: 02/01/2021   PT End of Session - 02/01/21 1213     Visit Number 22    Number of Visits 27    Date for PT Re-Evaluation 03/08/21    Authorization Type Medicare A&B; 10th visit PN    Authorization Time Period Pending re-auth    Progress Note Due on Visit 24    PT Start Time 1215    PT Stop Time 40    PT Time Calculation (min) 45 min    Activity Tolerance Patient tolerated treatment well    Behavior During Therapy [facility] for tasks assessed/performed             Past Medical History:  Diagnosis Date   Abnormal prostate biopsy    Anticoagulant long-term use    currently xarelto   BPH with elevated PSA    CKD (chronic kidney disease), stage II    Complication of anesthesia    limted neck rom limited use of left arm due to cva   Coronary artery disease    CARDIOLOGIST-  DR Irish Lack--  2010-- PCI w/ stenting midLAD   DDD (degenerative disc disease), lumbar    Degeneration of cervical intervertebral disc    Depression    Diabetes mellitus without complication (HCC)    Dyspnea on exertion    GERD (gastroesophageal reflux disease)    Hemiparesis due to cerebral infarction    History of cerebrovascular accident (CVA) with residual deficit 2002 and 2003--  hemiparisis both sides   per MRI  anterior left frontal lobe, left para midline pons, and inferior cerebullam bilaterally infarcts   History of pulmonary embolus (PE)    06-30-2012  extensive bilaterally   History of recurrent TIAs    History of syncope    hx multiple pre-syncope and syncopal episodes due to vasovagal, orthostatic hypotension, dehydration   History of TIAs    several since 2002    Hyperlipidemia    Hypertension    Mild atherosclerosis of carotid artery, bilateral    per last duplex 11-04-2014  bilateral ICA 1--39%   Neuropathy    fingers   OSA on CPAP    followed by dr dohmeier--  sev. osa w/ AHI 65.9   Prostate cancer ([place]) dx 2018   Renal insufficiency    S/P coronary artery stent placement 2010   stenting to mid LAD   Simple renal cyst    bilaterally   Stroke [facility])    Trigger finger of both hands 11-17-13   Type 2 diabetes mellitus ([place]) dx 1986   last one A1c 9.2 on 04-26-2016   Unsteady gait    . Hx prior CVA/TIAs;   Vertebral artery occlusion, left    chronic    Past Surgical History:  Procedure Laterality Date   ANTERIOR CERVICAL DECOMP/DISCECTOMY FUSION  2004   C3 -- C6 limited rom   CARDIAC CATHETERIZATION  06-10-2010   dr Irish Lack   wide patent LAD stent, mid lesion at the origin of the septal prior to the previous stent 40-50%/  normal LVF, ef 55%   CARDIOVASCULAR STRESS TEST  10-23-2012  dr Irish Lack   normal nuclear perfusion study w/ no ischemia/  normal LV function and wall motion , ef 65%  CARPAL TUNNEL RELEASE Bilateral    CATARACT EXTRACTION W/ INTRAOCULAR LENS  IMPLANT, BILATERAL     CHOLECYSTECTOMY N/A 11/02/2015   Procedure: LAPAROSCOPIC CHOLECYSTECTOMY WITH INTRAOPERATIVE CHOLANGIOGRAM;  Surgeon: Donnie Mesa, MD;  Location: [place];  Service: General;  Laterality: N/A;   COLONOSCOPY     CORONARY ANGIOPLASTY WITH STENT PLACEMENT  02/2008   stenting to mid LAD   GOLD SEED IMPLANT N/A 11/15/2016   Procedure: GOLD SEED IMPLANT [place];  Surgeon: Ardis Hughs, MD;  Location: [facility];  Service: Urology;  Laterality: N/A;   IR ANGIO INTRA EXTRACRAN SEL COM CAROTID INNOMINATE BILAT MOD SED  06/13/2018   IR ANGIO VERTEBRAL SEL VERTEBRAL UNI R MOD SED  06/13/2018   IR US GUIDE VASC ACCESS RIGHT  06/13/2018   LEFT HEART CATH AND CORONARY ANGIOGRAPHY N/A 05/25/2017   Procedure: LEFT HEART CATH AND CORONARY  ANGIOGRAPHY;  Surgeon: Jettie Booze, MD;  Location: [place] [facility];  Service: Cardiovascular;  Laterality: N/A;   LEFT HEART CATHETERIZATION WITH CORONARY ANGIOGRAM N/A 04/03/2013   Procedure: LEFT HEART CATHETERIZATION WITH CORONARY ANGIOGRAM;  Surgeon: Jettie Booze, MD;  Location: [facility] CATH LAB;  Service: Cardiovascular;  Laterality: N/A;  patent mLAD stent  w/ mild disease in remainder LAD and its branches;  mod. focal lesion midLCFx- FFR of lesion was negative for ischemia/  normal LVSF, ef 50%   lungs  2005   "fluid pumped off lungs"   NEUROPLASTY / TRANSPOSITION ULNAR NERVE AT ELBOW Right 2004   PROSTATE BIOPSY N/A 08/31/2016   Procedure: PROSTATE  BIOPSY TRANSRECTAL ULTRASONIC PROSTATE (TUBP);  Surgeon: Ardis Hughs, MD;  Location: [facility];  Service: Urology;  Laterality: N/A;   SPACE OAR INSTILLATION N/A 11/15/2016   Procedure: SPACE OAR INSTILLATION;  Surgeon: Ardis Hughs, MD;  Location: [facility];  Service: Urology;  Laterality: N/A;   TRANSTHORACIC ECHOCARDIOGRAM  04/27/2016   severe focal basal LVH, ef 60-65%,  grade 2 diastoilc dysfunction/  mild AR, MR, and TR/  atrial septum lipomatous hypertrophy/  PASP [id number]   UMBILICAL HERNIA REPAIR      There were no vitals filed for this visit.   Subjective Assessment - 02/01/21 1214     Subjective Symptoms still come and go, sleeping better.  Still not able to walk a mile but has not tried to ambulate long distances yet.    Pertinent History Long-term anticoagulant use, BPH with elevated PSA, CKD, CAD, DDD lumbar, degeneration of cervical intervertebral disc, depression, DM, DOE, hemiparesis due to CVA, multiple CVA, ACDF C3-C6, bilat carpal tunnel release, PE, h/o syncope, h/o TIA, HLD, HTN, neuropathy, OSA, prostate CA, Vertebral artery occlusion, neuroplasty/transposition ulnar nn at elbow    Limitations Walking;Standing    How long can you sit comfortably? unimited     How long can you stand comfortably? <15 minutes    How long can you walk comfortably? <15 minutes    Patient Stated Goals To relieve LLE pain, improve walking    Pain Onset More than a month ago           [facility] Adult PT Treatment:                                                DATE: 02/01/21 Therapeutic Exercise: Bridge 15x Bridge with ball  15x SLR 15x B Hooklie curl ups15x SL open book 15x B Manual Therapy: Assessment of SI alignment and TP release to L quadriceps/hip flexor   [facility] Adult PT Treatment:                                                DATE: 01/25/2021 Therapeutic Exercise: Supine 90/90 abdominal isometric with handhold resistance 3x30sec Standing Pallof press with 7# cable 2x10 with 5-sec hold BIL Hooklying curl-ups 3x10 Hooklying lower trunk rotations Manual Therapy: Skilled palpation to lumbar paraspinals and QL prior to and following TPDN Effleurage to BIL lumbar paraspinals and QL Neuromuscular re-ed: N/A       Therapeutic Activity: N/A Modalities: N/A Self Care: N/A       PT Short Term Goals - 12/09/20 1540       PT SHORT TERM GOAL #1   Title Pt will demonstrate independence with updated and revised HEP    Baseline Independent in current HEP    Time 4    Period Weeks    Status Achieved    Target Date 09/16/20      PT SHORT TERM GOAL #2   Title Pt will demonstrate 10 degrees increase in L hamstring ROM and L hip flexor ROM    Baseline 60 deg hamstring, lacking 20 deg to neutral hip extension LLE Thomas test; 08/30/20 L hamstring ROM 90d in supine; 10/05/20 90d in supine    Time 4    Period Weeks    Status Achieved    Target Date 09/16/20      PT SHORT TERM GOAL #3   Title Pt will participate in stair negotiation assessment with LTG to be set    Baseline TBD; 10/05/20 Able to negotiate a full flight of steps w/o need of rails with step through pattern    Time 4    Period Weeks    Status Achieved    Target Date 09/16/20      PT SHORT TERM  GOAL #4   Title Pt will decrease five time sit to stand to </= 15 seconds without UE support    Baseline 18.2 seconds; 10/05/20 10.9s    Time 4    Period Weeks    Status Achieved    Target Date 09/16/20      PT SHORT TERM GOAL #5   Title Pt will increase gait velocity to >/= 2.8 ft/sec without AD and with decreased antalgic gait sequence    Baseline 2.46 ft/sec; 10/05/20 0.76 m/s or 2.49 ft/s; 10/29/18 Gait speed 2.33 ft/s; 12/09/20 2/79 ft/s or 0.85 m/s    Time 4    Period Weeks    Status Achieved    Target Date 09/16/20               PT Long Term Goals - 01/17/21 1142       PT LONG TERM GOAL #1   Title Pt will be independent with final HEP and walking program at [facility] Patient has returnes to [facility] walking and performing chair yoga    Time 4    Period Weeks    Status Achieved    Target Date 01/06/21      PT LONG TERM GOAL #2   Title Pt will negotiate 16 stairs with one rail, alternating sequence MOD I    Baseline 10/19/20  Patient able to negotiate 16 steps with step throug pattern with B rails; 01/17/21 Able to negotiate 16 steps with step through patteren, verblly onfirmed.    Time 4    Period Weeks    Status Achieved    Target Date 01/06/21      PT LONG TERM GOAL #3   Title Pt will demonstrate symmetrical hamstring length and hip extension (Thomas test) L to R side    Baseline 10/19/20 B hamstring flexibility esentiall equal, 90d B SLR, Thomas test ROM also equal upon observation    Time 8    Period Weeks    Status Achieved    Target Date 01/06/21      PT LONG TERM GOAL #4   Title Pt will decrease five time sit to stand to </=13 seconds to indicate decreased falls risk    Baseline 10/19/20 5x STS in 12.6s    Time 4    Period Weeks    Status Achieved      PT LONG TERM GOAL #5   Title Pt will increase gait velocity to >/= 3.0 ft/sec without AD    Baseline 10/19/20 gait velocity 0.68 m/s or 2.23 ft/s    Time 4    Period Weeks    Status Partially Met     Target Date 02/14/21      Additional Long Term Goals   Additional Long Term Goals Yes      PT LONG TERM GOAL #6   Title Decrease L hip flexor/psoas tenderness from 3/5 to 1/5    Baseline 3/5 tendernes in L psoas    Time 4    Period Weeks    Target Date 02/14/21                   Plan - 02/01/21 1252     Clinical Impression Statement Todays session focused on L hip flexor stretching, strengthening and TP release to L quads/psoas, hip flexor stretch post release less restrcited, B hip flexor weakness note as well as continued abdominal weakness and difficulty performing a curl up.  Forward flexion in standing less restricted and painful after session    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    Examination-Activity Limitations Bend;Locomotion Level;Stairs;Stand    Examination-Participation Restrictions Community Activity;Other    Stability/Clinical Decision Making Evolving/Moderate complexity    Rehab Potential Good    PT Frequency 1x / week    PT Duration 4 weeks    PT Treatment/Interventions ADLs/Self Care Home Management;Aquatic Therapy;DME Instruction;Gait training;Stair training;Functional mobility training;Therapeutic activities;Therapeutic exercise;Balance training;Neuromuscular re-education;Patient/family education;Orthotic Fit/Training;Cryotherapy;Moist Heat;Manual techniques;Passive range of motion;Dry needling;Taping    PT Next Visit Plan assess benefit of new exercises, check L quad/psoas tenderness, consider DN of L quad.    PT Home Exercise Plan [email or web address]     Consulted and Agree with Plan of Care Patient             Patient will benefit from skilled therapeutic intervention in order to improve the following deficits and impairments:  Decreased activity tolerance, Decreased balance, Cardiopulmonary status limiting activity, Decreased endurance, Decreased range of motion, Decreased strength, Difficulty walking, Impaired sensation, Postural dysfunction, Pain  Visit Diagnosis:  Sciatica, left side  Muscle weakness (generalized)  Pain in left leg     Problem List Patient Active Problem List   Diagnosis Date Noted   Amaurosis fugax, both eyes 01/18/2021   Transient diplopia 01/18/2021   Hereditary and idiopathic neuropathy, unspecified 11/20/2020   Hyperglycemia due to type 2 diabetes mellitus ([place]) 11/20/2020   Obesity 11/20/2020   Pure hypercholesterolemia 11/20/2020   Syncope 05/26/2020   Back pain 37/85/8850   Acute metabolic encephalopathy 27/74/1287   Lumbar spinal stenosis 08/15/2019   Nonspecific chest pain 08/11/2019   Shortness of breath    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 ([place]) 10/21/2018   Nocturnal hypoxemia 10/21/2018   Radiation therapy complication 86/76/7209   Primary prostate cancer ([place]) 07/31/2017   Anemia 07/31/2017   Vertigo due to cerebrovascular disease 07/31/2017   Poor compliance with CPAP treatment 07/31/2017   Absolute anemia 05/16/2017   Avitaminosis D 05/16/2017   Benign essential HTN 05/16/2017   Benign prostatic hypertrophy without urinary obstruction 05/16/2017   Clinical depression 05/16/2017   CN (constipation) 05/16/2017   Current drug use 05/16/2017   Diabetic neuropathy ([place]) 05/16/2017   Genital herpes 05/16/2017   Infarction of lung due to iatrogenic pulmonary embolism ([place]) 05/16/2017   Sciatica associated with disorder of lumbar spine 05/16/2017   Arteriosclerosis of coronary artery 05/16/2017   Artery disease, cerebral 05/16/2017   Apnea, sleep 05/16/2017    Arthralgia of hip or thigh 05/16/2017   Malignant neoplasm of prostate ([place]) 09/28/2016   Vascular dementia in remission ([place]) 09/28/2016   Remote history of stroke 09/28/2016   Acute kidney injury ([place]) 05/18/2016   Dehydration 05/18/2016   Near syncope 05/18/2016   Orthostatic hypotension 05/18/2016   CKD (chronic kidney disease), stage II 04/26/2016   UTI (urinary tract infection) 04/26/2016   Encounter for counseling on use of CPAP 11/18/2015   Chronic cholecystitis with calculus 11/02/2015   Stroke, 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[place]) 04/22/2015   TIA (transient ischemic attack) 11/03/2014   OSA on CPAP 05/18/2014   White matter disease 08/14/2013   Abnormal x-ray of temporomandibular joint 05/30/2013   Chronic infection of sinus 05/30/2013   Cough 05/30/2013   Fatigue 05/30/2013   Unsteady gait 05/19/2013   Combined fat and carbohydrate induced hyperlipemia 04/24/2013   Syncope 03/20/2013   Angina pectoris ([place]) 10/08/2012   Hypertension    Stroke ([place])    History of TIAs    Lumbago    Other and unspecified hyperlipidemia    Personal history of unspecified circulatory disease    Unwitnessed fall    Pain in joint, multiple sites    Degeneration of cervical intervertebral disc    Unspecified cardiovascular disease    History of pulmonary embolism: June 2014,  Takes Xarelto 07/01/2012    Class: History of   Hemiparesis ([place]) 07/18/2011   Diabetes mellitus type 2 with complications ([place]) 47/09/6281   CAD in native artery 07/18/2011   History of recurrent TIAs 07/18/2011    Lanice Shirts, PT 02/01/2021, 1:07 PM  [facility] [place], [place], [address] Phone: [phone]   Fax:  [phone]  Name: Caleb Decock MRN: [id number] Date of Birth: 02-Jul-1940

## 2021-02-01 NOTE — Patient Instructions (Signed)
Access Code: [email or web address] URL: [email or web address] Date: 02/01/2021 Prepared by: Sharlynn Oliphant  Exercises Squat - 2 x daily - 7 x weekly - 2 sets - 10 reps Prone on Elbows Stretch - 2 x daily - 7 x weekly - 1-2 sets - 2 min hold Supine Dynamic Modified Karl Pock and Hip Flexor Dynamic Stretch - 2 x daily - 7 x weekly - 1 sets - 3 reps - 30s hold Supine Active Straight Leg Raise - 2 x daily - 7 x weekly - 1 sets - 15 reps Sidelying Thoracic Rotation with Open Book - 2 x daily - 7 x weekly - 1 sets - 15 reps

## 2021-02-08 ENCOUNTER — Ambulatory Visit: Payer: Medicare HMO

## 2021-02-08 ENCOUNTER — Other Ambulatory Visit: Payer: Self-pay

## 2021-02-08 DIAGNOSIS — M79605 Pain in left leg: Secondary | ICD-10-CM

## 2021-02-08 DIAGNOSIS — M6281 Muscle weakness (generalized): Secondary | ICD-10-CM

## 2021-02-08 DIAGNOSIS — M5432 Sciatica, left side: Secondary | ICD-10-CM

## 2021-02-08 NOTE — Therapy (Signed)
[facility] [place], [place], [address] Phone: [phone]   Fax:  [phone]  Physical Therapy Treatment  Patient Details  Name: Steve Brown MRN: [id number] Date of Birth: [birth year] Referring Provider (PT): Lawerance Cruel, MD   Encounter Date: 02/08/2021     Past Medical History:  Diagnosis Date   Abnormal prostate biopsy    Anticoagulant long-term use    currently xarelto   BPH with elevated PSA    CKD (chronic kidney disease), stage II    Complication of anesthesia    limted neck rom limited use of left arm due to cva   Coronary artery disease    CARDIOLOGIST-  DR Irish Lack--  2010-- PCI w/ stenting midLAD   DDD (degenerative disc disease), lumbar    Degeneration of cervical intervertebral disc    Depression    Diabetes mellitus without complication (HCC)    Dyspnea on exertion    GERD (gastroesophageal reflux disease)    Hemiparesis due to cerebral infarction    History of cerebrovascular accident (CVA) with residual deficit 2002 and 2003--  hemiparisis both sides   per MRI  anterior left frontal lobe, left para midline pons, and inferior cerebullam bilaterally infarcts   History of pulmonary embolus (PE)    06-30-2012  extensive bilaterally   History of recurrent TIAs    History of syncope    hx multiple pre-syncope and syncopal episodes due to vasovagal, orthostatic hypotension, dehydration   History of TIAs    several since 2002   Hyperlipidemia    Hypertension    Mild atherosclerosis of carotid artery, bilateral    per last duplex 11-04-2014  bilateral ICA 1--39%   Neuropathy    fingers   OSA on CPAP    followed by dr dohmeier--  sev. osa w/ AHI 65.9   Prostate cancer ([place]) dx 2018   Renal insufficiency    S/P coronary artery stent placement 2010   stenting to mid LAD   Simple renal cyst    bilaterally   Stroke [facility])    Trigger finger of both hands 11-17-13   Type 2 diabetes  mellitus ([place]) dx 1986   last one A1c 9.2 on 04-26-2016   Unsteady gait    . Hx prior CVA/TIAs;   Vertebral artery occlusion, left    chronic    Past Surgical History:  Procedure Laterality Date   ANTERIOR CERVICAL DECOMP/DISCECTOMY FUSION  2004   C3 -- C6 limited rom   CARDIAC CATHETERIZATION  06-10-2010   dr Irish Lack   wide patent LAD stent, mid lesion at the origin of the septal prior to the previous stent 40-50%/  normal LVF, ef 55%   CARDIOVASCULAR STRESS TEST  10-23-2012  dr Irish Lack   normal nuclear perfusion study w/ no ischemia/  normal LV function and wall motion , ef 65%   CARPAL TUNNEL RELEASE Bilateral    CATARACT EXTRACTION W/ INTRAOCULAR LENS  IMPLANT, BILATERAL     CHOLECYSTECTOMY N/A 11/02/2015   Procedure: LAPAROSCOPIC CHOLECYSTECTOMY WITH INTRAOPERATIVE CHOLANGIOGRAM;  Surgeon: Donnie Mesa, MD;  Location: [facility];  Service: General;  Laterality: N/A;   COLONOSCOPY     CORONARY ANGIOPLASTY WITH STENT PLACEMENT  02/2008   stenting to mid LAD   GOLD SEED IMPLANT N/A 11/15/2016   Procedure: [place];  Surgeon: Ardis Hughs, MD;  Location: [facility];  Service: Urology;  Laterality: N/A;   IR ANGIO INTRA EXTRACRAN SEL COM  CAROTID INNOMINATE BILAT MOD SED  06/13/2018   IR ANGIO VERTEBRAL SEL VERTEBRAL UNI R MOD SED  06/13/2018   IR US GUIDE VASC ACCESS RIGHT  06/13/2018   LEFT HEART CATH AND CORONARY ANGIOGRAPHY N/A 05/25/2017   Procedure: LEFT HEART CATH AND CORONARY ANGIOGRAPHY;  Surgeon: Jettie Booze, MD;  Location: [facility];  Service: Cardiovascular;  Laterality: N/A;   LEFT HEART CATHETERIZATION WITH CORONARY ANGIOGRAM N/A 04/03/2013   Procedure: LEFT HEART CATHETERIZATION WITH CORONARY ANGIOGRAM;  Surgeon: Jettie Booze, MD;  Location: [facility] CATH LAB;  Service: Cardiovascular;  Laterality: N/A;  patent mLAD stent  w/ mild disease in remainder LAD and its branches;  mod. focal lesion midLCFx- FFR of lesion was  negative for ischemia/  normal LVSF, ef 50%   lungs  2005   "fluid pumped off lungs"   NEUROPLASTY / TRANSPOSITION ULNAR NERVE AT ELBOW Right 2004   PROSTATE BIOPSY N/A 08/31/2016   Procedure: PROSTATE  BIOPSY TRANSRECTAL ULTRASONIC PROSTATE (TUBP);  Surgeon: Ardis Hughs, MD;  Location: [facility];  Service: Urology;  Laterality: N/A;   SPACE OAR INSTILLATION N/A 11/15/2016   Procedure: SPACE OAR INSTILLATION;  Surgeon: Ardis Hughs, MD;  Location: [facility];  Service: Urology;  Laterality: N/A;   TRANSTHORACIC ECHOCARDIOGRAM  04/27/2016   severe focal basal LVH, ef 60-65%,  grade 2 diastoilc dysfunction/  mild AR, MR, and TR/  atrial septum lipomatous hypertrophy/  PASP [id number]   UMBILICAL HERNIA REPAIR      There were no vitals filed for this visit.   [facility] Adult PT Treatment:                                                DATE: 02/01/21 Therapeutic Exercise: Bridge 15x Bridge with ball 15x SLR 15x B Hooklie curl ups15x SL open book 15x B Manual Therapy: Assessment of SI alignment and TP release to L quadriceps/hip flexor   [facility] Adult PT Treatment:                                                DATE: 01/25/2021 Therapeutic Exercise: Supine 90/90 abdominal isometric with handhold resistance 3x30sec Standing Pallof press with 7# cable 2x10 with 5-sec hold BIL Hooklying curl-ups 3x10 Hooklying lower trunk rotations Manual Therapy: Skilled palpation to lumbar paraspinals and QL prior to and following TPDN Effleurage to BIL lumbar paraspinals and QL Neuromuscular re-ed: N/A       Therapeutic Activity: N/A Modalities: N/A Self Care: N/A       PT Short Term Goals - 12/09/20 1540       PT SHORT TERM GOAL #1   Title Pt will demonstrate independence with updated and revised HEP    Baseline Independent in current HEP    Time 4    Period Weeks    Status Achieved    Target Date 09/16/20      PT SHORT TERM GOAL #2   Title Pt  will demonstrate 10 degrees increase in L hamstring ROM and L hip flexor ROM    Baseline 60 deg hamstring, lacking 20 deg to neutral hip extension LLE Thomas test; 08/30/20 L hamstring ROM 90d in supine; 10/05/20  90d in supine    Time 4    Period Weeks    Status Achieved    Target Date 09/16/20      PT SHORT TERM GOAL #3   Title Pt will participate in stair negotiation assessment with LTG to be set    Baseline TBD; 10/05/20 Able to negotiate a full flight of steps w/o need of rails with step through pattern    Time 4    Period Weeks    Status Achieved    Target Date 09/16/20      PT SHORT TERM GOAL #4   Title Pt will decrease five time sit to stand to </= 15 seconds without UE support    Baseline 18.2 seconds; 10/05/20 10.9s    Time 4    Period Weeks    Status Achieved    Target Date 09/16/20      PT SHORT TERM GOAL #5   Title Pt will increase gait velocity to >/= 2.8 ft/sec without AD and with decreased antalgic gait sequence    Baseline 2.46 ft/sec; 10/05/20 0.76 m/s or 2.49 ft/s; 10/29/18 Gait speed 2.33 ft/s; 12/09/20 2/79 ft/s or 0.85 m/s    Time 4    Period Weeks    Status Achieved    Target Date 09/16/20               PT Long Term Goals - 01/17/21 1142       PT LONG TERM GOAL #1   Title Pt will be independent with final HEP and walking program at [place] Patient has returnes to [facility] walking and performing chair yoga    Time 4    Period Weeks    Status Achieved    Target Date 01/06/21      PT LONG TERM GOAL #2   Title Pt will negotiate 16 stairs with one rail, alternating sequence MOD I    Baseline 10/19/20 Patient able to negotiate 16 steps with step throug pattern with B rails; 01/17/21 Able to negotiate 16 steps with step through patteren, verblly onfirmed.    Time 4    Period Weeks    Status Achieved    Target Date 01/06/21      PT LONG TERM GOAL #3   Title Pt will demonstrate symmetrical hamstring length and hip extension (Thomas test) L to R side     Baseline 10/19/20 B hamstring flexibility esentiall equal, 90d B SLR, Thomas test ROM also equal upon observation    Time 8    Period Weeks    Status Achieved    Target Date 01/06/21      PT LONG TERM GOAL #4   Title Pt will decrease five time sit to stand to </=13 seconds to indicate decreased falls risk    Baseline 10/19/20 5x STS in 12.6s    Time 4    Period Weeks    Status Achieved      PT LONG TERM GOAL #5   Title Pt will increase gait velocity to >/= 3.0 ft/sec without AD    Baseline 10/19/20 gait velocity 0.68 m/s or 2.23 ft/s    Time 4    Period Weeks    Status Partially Met    Target Date 02/14/21      Additional Long Term Goals   Additional Long Term Goals Yes      PT LONG TERM GOAL #6   Title Decrease L hip flexor/psoas tenderness from 3/5 to 1/5  Baseline 3/5 tendernes in L psoas    Time 4    Period Weeks    Target Date 02/14/21                     Patient will benefit from skilled therapeutic intervention in order to improve the following deficits and impairments:     Visit Diagnosis: No diagnosis found.     Problem List Patient Active Problem List   Diagnosis Date Noted   Amaurosis fugax, both eyes 01/18/2021   Transient diplopia 01/18/2021   Hereditary and idiopathic neuropathy, unspecified 11/20/2020   Hyperglycemia due to type 2 diabetes mellitus ([place]) 11/20/2020   Obesity 11/20/2020   Pure hypercholesterolemia 11/20/2020   Syncope 05/26/2020   Back pain 14/78/2956   Acute metabolic encephalopathy 21/30/8657   Lumbar spinal stenosis 08/15/2019   Nonspecific chest pain 08/11/2019   Shortness of breath    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 ([place]) 10/21/2018   Nocturnal hypoxemia 10/21/2018   Radiation therapy complication 84/69/6295   Primary prostate cancer ([place]) 07/31/2017   Anemia 07/31/2017   Vertigo due to cerebrovascular disease 07/31/2017   Poor compliance with CPAP treatment 07/31/2017   Absolute  anemia 05/16/2017   Avitaminosis D 05/16/2017   Benign essential HTN 05/16/2017   Benign prostatic hypertrophy without urinary obstruction 05/16/2017   Clinical depression 05/16/2017   CN (constipation) 05/16/2017   Current drug use 05/16/2017   Diabetic neuropathy ([place]) 05/16/2017   Genital herpes 05/16/2017   Infarction of lung due to iatrogenic pulmonary embolism ([place]) 05/16/2017   Sciatica associated with disorder of lumbar spine 05/16/2017   Arteriosclerosis of coronary artery 05/16/2017   Artery disease, cerebral 05/16/2017   Apnea, sleep 05/16/2017   Arthralgia of hip or thigh 05/16/2017   Malignant neoplasm of prostate ([place]) 09/28/2016   Vascular dementia in remission ([place]) 09/28/2016   Remote history of stroke 09/28/2016   Acute kidney injury ([place]) 05/18/2016   Dehydration 05/18/2016   Near syncope 05/18/2016   Orthostatic hypotension 05/18/2016   CKD (chronic kidney disease), stage II 04/26/2016   UTI (urinary tract infection) 04/26/2016   Encounter for counseling on use of CPAP 11/18/2015   Chronic cholecystitis with calculus 11/02/2015   Stroke, 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[place]) 04/22/2015   TIA (transient ischemic attack) 11/03/2014   OSA on CPAP 05/18/2014   White matter disease 08/14/2013   Abnormal x-ray of temporomandibular joint 05/30/2013   Chronic infection of sinus 05/30/2013   Cough 05/30/2013   Fatigue 05/30/2013   Unsteady gait 05/19/2013   Combined fat and carbohydrate induced hyperlipemia 04/24/2013   Syncope 03/20/2013   Angina pectoris ([place]) 10/08/2012   Hypertension    Stroke ([place])    History of TIAs    Lumbago    Other and unspecified hyperlipidemia    Personal history of unspecified circulatory disease    Unwitnessed fall    Pain in joint, multiple sites    Degeneration of cervical intervertebral disc    Unspecified cardiovascular disease    History of pulmonary embolism: June 2014,  Takes Xarelto 07/01/2012    Class: History of   Hemiparesis  ([place]) 07/18/2011   Diabetes mellitus type 2 with complications ([place]) 28/41/3244   CAD in native artery 07/18/2011   History of recurrent TIAs 07/18/2011    Lanice Shirts, PT 02/08/2021, 12:17 PM  [place], [place], [address] Phone: [phone]   Fax:  [phone]  Name: Steve Brown MRN: [id number]  Date of Birth: 11/29/1940

## 2021-02-08 NOTE — Therapy (Signed)
[place], [place], [address] Phone: [phone]   Fax:  [phone]  Physical Therapy Treatment  Patient Details  Name: Steve Brown MRN: [id number] Date of Birth: [birth year] Referring Provider (PT): Lawerance Cruel, MD   Encounter Date: 02/08/2021   PT End of Session - 02/08/21 1218     Visit Number 23    Number of Visits 27    Date for PT Re-Evaluation 03/08/21    Authorization Type Medicare A&B; 10th visit PN    Authorization Time Period Pending re-auth    Progress Note Due on Visit 24    PT Start Time 1220    PT Stop Time 1305    PT Time Calculation (min) 45 min    Activity Tolerance Patient tolerated treatment well    Behavior During Therapy [facility] for tasks assessed/performed             Past Medical History:  Diagnosis Date   Abnormal prostate biopsy    Anticoagulant long-term use    currently xarelto   BPH with elevated PSA    CKD (chronic kidney disease), stage II    Complication of anesthesia    limted neck rom limited use of left arm due to cva   Coronary artery disease    CARDIOLOGIST-  DR Irish Lack--  2010-- PCI w/ stenting midLAD   DDD (degenerative disc disease), lumbar    Degeneration of cervical intervertebral disc    Depression    Diabetes mellitus without complication (HCC)    Dyspnea on exertion    GERD (gastroesophageal reflux disease)    Hemiparesis due to cerebral infarction    History of cerebrovascular accident (CVA) with residual deficit 2002 and 2003--  hemiparisis both sides   per MRI  anterior left frontal lobe, left para midline pons, and inferior cerebullam bilaterally infarcts   History of pulmonary embolus (PE)    06-30-2012  extensive bilaterally   History of recurrent TIAs    History of syncope    hx multiple pre-syncope and syncopal episodes due to vasovagal, orthostatic hypotension, dehydration   History of TIAs    several since 2002    Hyperlipidemia    Hypertension    Mild atherosclerosis of carotid artery, bilateral    per last duplex 11-04-2014  bilateral ICA 1--39%   Neuropathy    fingers   OSA on CPAP    followed by dr dohmeier--  sev. osa w/ AHI 65.9   Prostate cancer ([place]) dx 2018   Renal insufficiency    S/P coronary artery stent placement 2010   stenting to mid LAD   Simple renal cyst    bilaterally   Stroke [facility])    Trigger finger of both hands 11-17-13   Type 2 diabetes mellitus ([place]) dx 1986   last one A1c 9.2 on 04-26-2016   Unsteady gait    . Hx prior CVA/TIAs;   Vertebral artery occlusion, left    chronic    Past Surgical History:  Procedure Laterality Date   ANTERIOR CERVICAL DECOMP/DISCECTOMY FUSION  2004   C3 -- C6 limited rom   CARDIAC CATHETERIZATION  06-10-2010   dr Irish Lack   wide patent LAD stent, mid lesion at the origin of the septal prior to the previous stent 40-50%/  normal LVF, ef 55%   CARDIOVASCULAR STRESS TEST  10-23-2012  dr Irish Lack   normal nuclear perfusion study w/ no ischemia/  normal LV function and wall motion , ef 65%  CARPAL TUNNEL RELEASE Bilateral    CATARACT EXTRACTION W/ INTRAOCULAR LENS  IMPLANT, BILATERAL     CHOLECYSTECTOMY N/A 11/02/2015   Procedure: LAPAROSCOPIC CHOLECYSTECTOMY WITH INTRAOPERATIVE CHOLANGIOGRAM;  Surgeon: Donnie Mesa, MD;  Location: [place];  Service: General;  Laterality: N/A;   COLONOSCOPY     CORONARY ANGIOPLASTY WITH STENT PLACEMENT  02/2008   stenting to mid LAD   GOLD SEED IMPLANT N/A 11/15/2016   Procedure: GOLD SEED IMPLANT [place];  Surgeon: Ardis Hughs, MD;  Location: [facility];  Service: Urology;  Laterality: N/A;   IR ANGIO INTRA EXTRACRAN SEL COM CAROTID INNOMINATE BILAT MOD SED  06/13/2018   IR ANGIO VERTEBRAL SEL VERTEBRAL UNI R MOD SED  06/13/2018   IR US GUIDE VASC ACCESS RIGHT  06/13/2018   LEFT HEART CATH AND CORONARY ANGIOGRAPHY N/A 05/25/2017   Procedure: LEFT HEART CATH AND CORONARY  ANGIOGRAPHY;  Surgeon: Jettie Booze, MD;  Location: [facility];  Service: Cardiovascular;  Laterality: N/A;   LEFT HEART CATHETERIZATION WITH CORONARY ANGIOGRAM N/A 04/03/2013   Procedure: LEFT HEART CATHETERIZATION WITH CORONARY ANGIOGRAM;  Surgeon: Jettie Booze, MD;  Location: [facility] CATH LAB;  Service: Cardiovascular;  Laterality: N/A;  patent mLAD stent  w/ mild disease in remainder LAD and its branches;  mod. focal lesion midLCFx- FFR of lesion was negative for ischemia/  normal LVSF, ef 50%   lungs  2005   "fluid pumped off lungs"   NEUROPLASTY / TRANSPOSITION ULNAR NERVE AT ELBOW Right 2004   PROSTATE BIOPSY N/A 08/31/2016   Procedure: PROSTATE  BIOPSY TRANSRECTAL ULTRASONIC PROSTATE (TUBP);  Surgeon: Ardis Hughs, MD;  Location[facility];  Service: Urology;  Laterality: N/A;   SPACE OAR INSTILLATION N/A 11/15/2016   Procedure: SPACE OAR INSTILLATION;  Surgeon: Ardis Hughs, MD;  Location: [facility];  Service: Urology;  Laterality: N/A;   TRANSTHORACIC ECHOCARDIOGRAM  04/27/2016   severe focal basal LVH, ef 60-65%,  grade 2 diastoilc dysfunction/  mild AR, MR, and TR/  atrial septum lipomatous hypertrophy/  PASP [id number]   UMBILICAL HERNIA REPAIR      There were no vitals filed for this visit.   Subjective Assessment - 02/08/21 1222     Subjective L leg pain essentially resloved and is now able to lie and sleepon his back.  Has been referred for vestibular rehab and will receive this at a separate clinic    Pertinent History Long-term anticoagulant use, BPH with elevated PSA, CKD, CAD, DDD lumbar, degeneration of cervical intervertebral disc, depression, DM, DOE, hemiparesis due to CVA, multiple CVA, ACDF C3-C6, bilat carpal tunnel release, PE, h/o syncope, h/o TIA, HLD, HTN, neuropathy, OSA, prostate CA, Vertebral artery occlusion, neuroplasty/transposition ulnar nn at elbow    Limitations Walking;Standing    How long can  you sit comfortably? unimited    How long can you stand comfortably? <15 minutes    How long can you walk comfortably? <15 minutes    Patient Stated Goals To relieve LLE pain, improve walking    Pain Onset More than a month ago             [facility] Adult PT Treatment:                                                DATE:  02/08/21 Therapeutic Exercise: B hip flexor  stretch with bolster, 2 min hold  Nustep L4 8 min Bridge with ball 15x2 SLR 15x B Hooklie curl ups 15x2 SL open book 15x B Manual Therapy: Assessment of SI alignment and TP release to L quadriceps/hip flexor      PT Short Term Goals - 12/09/20 1540       PT SHORT TERM GOAL #1   Title Pt will demonstrate independence with updated and revised HEP    Baseline Independent in current HEP    Time 4    Period Weeks    Status Achieved    Target Date 09/16/20      PT SHORT TERM GOAL #2   Title Pt will demonstrate 10 degrees increase in L hamstring ROM and L hip flexor ROM    Baseline 60 deg hamstring, lacking 20 deg to neutral hip extension LLE Thomas test; 08/30/20 L hamstring ROM 90d in supine; 10/05/20 90d in supine    Time 4    Period Weeks    Status Achieved    Target Date 09/16/20      PT SHORT TERM GOAL #3   Title Pt will participate in stair negotiation assessment with LTG to be set    Baseline TBD; 10/05/20 Able to negotiate a full flight of steps w/o need of rails with step through pattern    Time 4    Period Weeks    Status Achieved    Target Date 09/16/20      PT SHORT TERM GOAL #4   Title Pt will decrease five time sit to stand to </= 15 seconds without UE support    Baseline 18.2 seconds; 10/05/20 10.9s    Time 4    Period Weeks    Status Achieved    Target Date 09/16/20      PT SHORT TERM GOAL #5   Title Pt will increase gait velocity to >/= 2.8 ft/sec without AD and with decreased antalgic gait sequence    Baseline 2.46 ft/sec; 10/05/20 0.76 m/s or 2.49 ft/s; 10/29/18 Gait speed 2.33 ft/s; 12/09/20  2/79 ft/s or 0.85 m/s    Time 4    Period Weeks    Status Achieved    Target Date 09/16/20               PT Long Term Goals - 01/17/21 1142       PT LONG TERM GOAL #1   Title Pt will be independent with final HEP and walking program at [place] Patient has returnes to [facility] walking and performing chair yoga    Time 4    Period Weeks    Status Achieved    Target Date 01/06/21      PT LONG TERM GOAL #2   Title Pt will negotiate 16 stairs with one rail, alternating sequence MOD I    Baseline 10/19/20 Patient able to negotiate 16 steps with step throug pattern with B rails; 01/17/21 Able to negotiate 16 steps with step through patteren, verblly onfirmed.    Time 4    Period Weeks    Status Achieved    Target Date 01/06/21      PT LONG TERM GOAL #3   Title Pt will demonstrate symmetrical hamstring length and hip extension (Thomas test) L to R side    Baseline 10/19/20 B hamstring flexibility esentiall equal, 90d B SLR, Thomas test ROM also equal upon observation    Time 8  Period Weeks    Status Achieved    Target Date 01/06/21      PT LONG TERM GOAL #4   Title Pt will decrease five time sit to stand to </=13 seconds to indicate decreased falls risk    Baseline 10/19/20 5x STS in 12.6s    Time 4    Period Weeks    Status Achieved      PT LONG TERM GOAL #5   Title Pt will increase gait velocity to >/= 3.0 ft/sec without AD    Baseline 10/19/20 gait velocity 0.68 m/s or 2.23 ft/s    Time 4    Period Weeks    Status Partially Met    Target Date 02/14/21      Additional Long Term Goals   Additional Long Term Goals Yes      PT LONG TERM GOAL #6   Title Decrease L hip flexor/psoas tenderness from 3/5 to 1/5    Baseline 3/5 tendernes in L psoas    Time 4    Period Weeks    Target Date 02/14/21                    Patient will benefit from skilled therapeutic intervention in order to improve the following deficits and impairments:     Visit  Diagnosis: Sciatica, left side  Muscle weakness (generalized)  Pain in left leg     Problem List Patient Active Problem List   Diagnosis Date Noted   Amaurosis fugax, both eyes 01/18/2021   Transient diplopia 01/18/2021   Hereditary and idiopathic neuropathy, unspecified 11/20/2020   Hyperglycemia due to type 2 diabetes mellitus ([place]) 11/20/2020   Obesity 11/20/2020   Pure hypercholesterolemia 11/20/2020   Syncope 05/26/2020   Back pain 54/27/0623   Acute metabolic encephalopathy 76/28/3151   Lumbar spinal stenosis 08/15/2019   Nonspecific chest pain 08/11/2019   Shortness of breath    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 ([place]) 10/21/2018   Nocturnal hypoxemia 10/21/2018   Radiation therapy complication 76/16/0737   Primary prostate cancer ([place]) 07/31/2017   Anemia 07/31/2017   Vertigo due to cerebrovascular disease 07/31/2017   Poor compliance with CPAP treatment 07/31/2017   Absolute anemia 05/16/2017   Avitaminosis D 05/16/2017   Benign essential HTN 05/16/2017   Benign prostatic hypertrophy without urinary obstruction 05/16/2017   Clinical depression 05/16/2017   CN (constipation) 05/16/2017   Current drug use 05/16/2017   Diabetic neuropathy ([place]) 05/16/2017   Genital herpes 05/16/2017   Infarction of lung due to iatrogenic pulmonary embolism ([place]) 05/16/2017   Sciatica associated with disorder of lumbar spine 05/16/2017   Arteriosclerosis of coronary artery 05/16/2017   Artery disease, cerebral 05/16/2017   Apnea, sleep 05/16/2017   Arthralgia of hip or thigh 05/16/2017   Malignant neoplasm of prostate ([place]) 09/28/2016   Vascular dementia in remission ([place]) 09/28/2016   Remote history of stroke 09/28/2016   Acute kidney injury ([place]) 05/18/2016   Dehydration 05/18/2016   Near syncope 05/18/2016   Orthostatic hypotension 05/18/2016   CKD (chronic kidney disease), stage II 04/26/2016   UTI (urinary tract infection) 04/26/2016   Encounter for  counseling on use of CPAP 11/18/2015   Chronic cholecystitis with calculus 11/02/2015   Stroke, 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[place]) 04/22/2015   TIA (transient ischemic attack) 11/03/2014   OSA on CPAP 05/18/2014   White matter disease 08/14/2013   Abnormal x-ray of temporomandibular joint 05/30/2013   Chronic infection of sinus 05/30/2013   Cough 05/30/2013  Fatigue 05/30/2013   Unsteady gait 05/19/2013   Combined fat and carbohydrate induced hyperlipemia 04/24/2013   Syncope 03/20/2013   Angina pectoris ([place]) 10/08/2012   Hypertension    Stroke [facility])    History of TIAs    Lumbago    Other and unspecified hyperlipidemia    Personal history of unspecified circulatory disease    Unwitnessed fall    Pain in joint, multiple sites    Degeneration of cervical intervertebral disc    Unspecified cardiovascular disease    History of pulmonary embolism: June 2014,  Takes Xarelto 07/01/2012    Class: History of   Hemiparesis ([place]) 07/18/2011   Diabetes mellitus type 2 with complications ([place]) 31/12/1622   CAD in native artery 07/18/2011   History of recurrent TIAs 07/18/2011    Lanice Shirts, PT 02/08/2021, 12:30 PM  [place], [place], [address] Phone: [phone]   Fax:  [phone]  Name: Steve Brown MRN: [id number] Date of Birth: Apr 14, 1940

## 2021-02-08 NOTE — Patient Instructions (Signed)
Access Code: [email or web address] URL: [email or web address] Date: 02/08/2021 Prepared by: Sharlynn Oliphant  Exercises Prone on Elbows Stretch - 2 x daily - 7 x weekly - 1-2 sets - 2 min hold Supine Dynamic Modified Karl Pock and Hip Flexor Dynamic Stretch - 2 x daily - 7 x weekly - 1 sets - 1 reps - 2 min hold Sidelying Thoracic Rotation with Open Book - 2 x daily - 7 x weekly - 1 sets - 15 reps Supine Straight Leg Raises - 2 x daily - 7 x weekly - 2 sets - 15 reps

## 2021-02-15 ENCOUNTER — Ambulatory Visit: Payer: Medicare HMO | Attending: Family Medicine

## 2021-02-15 ENCOUNTER — Other Ambulatory Visit: Payer: Self-pay

## 2021-02-15 DIAGNOSIS — M5432 Sciatica, left side: Secondary | ICD-10-CM | POA: Diagnosis not present

## 2021-02-15 DIAGNOSIS — M6281 Muscle weakness (generalized): Secondary | ICD-10-CM | POA: Diagnosis present

## 2021-02-15 DIAGNOSIS — M79605 Pain in left leg: Secondary | ICD-10-CM | POA: Insufficient documentation

## 2021-02-15 NOTE — Therapy (Signed)
[place], [place], [address] Phone: [phone]   Fax:  [phone]  Physical Therapy Treatment  Patient Details  Name: Steve Brown MRN: [id number] Date of Birth: 09/15/1940 Referring Provider (PT): Lawerance Cruel, MD   Encounter Date: 02/15/2021   PT End of Session - 02/15/21 1223     Visit Number 24    Number of Visits 27    Date for PT Re-Evaluation 03/08/21    Authorization Type Medicare A&B; 10th visit PN    Authorization Time Period Pending re-auth    Progress Note Due on Visit 25    PT Start Time 1220    PT Stop Time 59    PT Time Calculation (min) 40 min    Activity Tolerance Patient tolerated treatment well    Behavior During Therapy [facility] for tasks assessed/performed             Past Medical History:  Diagnosis Date   Abnormal prostate biopsy    Anticoagulant long-term use    currently xarelto   BPH with elevated PSA    CKD (chronic kidney disease), stage II    Complication of anesthesia    limted neck rom limited use of left arm due to cva   Coronary artery disease    CARDIOLOGIST-  DR Irish Lack--  2010-- PCI w/ stenting midLAD   DDD (degenerative disc disease), lumbar    Degeneration of cervical intervertebral disc    Depression    Diabetes mellitus without complication (HCC)    Dyspnea on exertion    GERD (gastroesophageal reflux disease)    Hemiparesis due to cerebral infarction    History of cerebrovascular accident (CVA) with residual deficit 2002 and 2003--  hemiparisis both sides   per MRI  anterior left frontal lobe, left para midline pons, and inferior cerebullam bilaterally infarcts   History of pulmonary embolus (PE)    06-30-2012  extensive bilaterally   History of recurrent TIAs    History of syncope    hx multiple pre-syncope and syncopal episodes due to vasovagal, orthostatic hypotension, dehydration   History of TIAs    several since 2002    Hyperlipidemia    Hypertension    Mild atherosclerosis of carotid artery, bilateral    per last duplex 11-04-2014  bilateral ICA 1--39%   Neuropathy    fingers   OSA on CPAP    followed by dr dohmeier--  sev. osa w/ AHI 65.9   Prostate cancer ([place]) dx 2018   Renal insufficiency    S/P coronary artery stent placement 2010   stenting to mid LAD   Simple renal cyst    bilaterally   Stroke [facility])    Trigger finger of both hands 11-17-13   Type 2 diabetes mellitus ([place]) dx 1986   last one A1c 9.2 on 04-26-2016   Unsteady gait    . Hx prior CVA/TIAs;   Vertebral artery occlusion, left    chronic    Past Surgical History:  Procedure Laterality Date   ANTERIOR CERVICAL DECOMP/DISCECTOMY FUSION  2004   C3 -- C6 limited rom   CARDIAC CATHETERIZATION  06-10-2010   dr Irish Lack   wide patent LAD stent, mid lesion at the origin of the septal prior to the previous stent 40-50%/  normal LVF, ef 55%   CARDIOVASCULAR STRESS TEST  10-23-2012  dr Irish Lack   normal nuclear perfusion study w/ no ischemia/  normal LV function and wall motion , ef 65%  CARPAL TUNNEL RELEASE Bilateral    CATARACT EXTRACTION W/ INTRAOCULAR LENS  IMPLANT, BILATERAL     CHOLECYSTECTOMY N/A 11/02/2015   Procedure: LAPAROSCOPIC CHOLECYSTECTOMY WITH INTRAOPERATIVE CHOLANGIOGRAM;  Surgeon: Donnie Mesa, MD;  Location: [place];  Service: General;  Laterality: N/A;   COLONOSCOPY     CORONARY ANGIOPLASTY WITH STENT PLACEMENT  02/2008   stenting to mid LAD   GOLD SEED IMPLANT N/A 11/15/2016   Procedure: GOLD SEED IMPLANT [place];  Surgeon: Ardis Hughs, MD;  Location: [facility];  Service: Urology;  Laterality: N/A;   IR ANGIO INTRA EXTRACRAN SEL COM CAROTID INNOMINATE BILAT MOD SED  06/13/2018   IR ANGIO VERTEBRAL SEL VERTEBRAL UNI R MOD SED  06/13/2018   IR US GUIDE VASC ACCESS RIGHT  06/13/2018   LEFT HEART CATH AND CORONARY ANGIOGRAPHY N/A 05/25/2017   Procedure: LEFT HEART CATH AND CORONARY  ANGIOGRAPHY;  Surgeon: Jettie Booze, MD;  Location: [place] [facility];  Service: Cardiovascular;  Laterality: N/A;   LEFT HEART CATHETERIZATION WITH CORONARY ANGIOGRAM N/A 04/03/2013   Procedure: LEFT HEART CATHETERIZATION WITH CORONARY ANGIOGRAM;  Surgeon: Jettie Booze, MD;  Location: [facility] CATH LAB;  Service: Cardiovascular;  Laterality: N/A;  patent mLAD stent  w/ mild disease in remainder LAD and its branches;  mod. focal lesion midLCFx- FFR of lesion was negative for ischemia/  normal LVSF, ef 50%   lungs  2005   "fluid pumped off lungs"   NEUROPLASTY / TRANSPOSITION ULNAR NERVE AT ELBOW Right 2004   PROSTATE BIOPSY N/A 08/31/2016   Procedure: PROSTATE  BIOPSY TRANSRECTAL ULTRASONIC PROSTATE (TUBP);  Surgeon: Ardis Hughs, MD;  Location: [facility];  Service: Urology;  Laterality: N/A;   SPACE OAR INSTILLATION N/A 11/15/2016   Procedure: SPACE OAR INSTILLATION;  Surgeon: Ardis Hughs, MD;  Location: [facility];  Service: Urology;  Laterality: N/A;   TRANSTHORACIC ECHOCARDIOGRAM  04/27/2016   severe focal basal LVH, ef 60-65%,  grade 2 diastoilc dysfunction/  mild AR, MR, and TR/  atrial septum lipomatous hypertrophy/  PASP [id number]   UMBILICAL HERNIA REPAIR      There were no vitals filed for this visit.   Subjective Assessment - 02/15/21 1248     Subjective Patient returns to clinic with reports of increased anterior L thigh pain, hasbeen compliant with HEP, able to continue fitness routine at the [facility].  Ha sone additionalsession remaining prior to planned DC    Pertinent History Long-term anticoagulant use, BPH with elevated PSA, CKD, CAD, DDD lumbar, degeneration of cervical intervertebral disc, depression, DM, DOE, hemiparesis due to CVA, multiple CVA, ACDF C3-C6, bilat carpal tunnel release, PE, h/o syncope, h/o TIA, HLD, HTN, neuropathy, OSA, prostate CA, Vertebral artery occlusion, neuroplasty/transposition ulnar nn at elbow     Limitations Walking;Standing    How long can you sit comfortably? unimited    How long can you stand comfortably? <15 minutes    How long can you walk comfortably? <15 minutes    Patient Stated Goals To relieve LLE pain, improve walking    Pain Onset More than a month ago           [facility] Adult PT Treatment:  DATE: 02/15/21 Therapeutic Exercise: B hip flexor  stretch with bolster, 2 min hold( unable to hold full 2 minutes) Bridge with ball 15x2 SLR 20x B(rest break needed) [place] curl ups 15x2 SL open book 15x B Manual Therapy: Assessment of SI alignment and TP release to L quadriceps/hip flexor, lumbar AROM    PT Short Term Goals - 12/09/20 1540       PT SHORT TERM GOAL #1   Title Pt will demonstrate independence with updated and revised HEP    Baseline Independent in current HEP    Time 4    Period Weeks    Status Achieved    Target Date 09/16/20      PT SHORT TERM GOAL #2   Title Pt will demonstrate 10 degrees increase in L hamstring ROM and L hip flexor ROM    Baseline 60 deg hamstring, lacking 20 deg to neutral hip extension LLE Thomas test; 08/30/20 L hamstring ROM 90d in supine; 10/05/20 90d in supine    Time 4    Period Weeks    Status Achieved    Target Date 09/16/20      PT SHORT TERM GOAL #3   Title Pt will participate in stair negotiation assessment with LTG to be set    Baseline TBD; 10/05/20 Able to negotiate a full flight of steps w/o need of rails with step through pattern    Time 4    Period Weeks    Status Achieved    Target Date 09/16/20      PT SHORT TERM GOAL #4   Title Pt will decrease five time sit to stand to </= 15 seconds without UE support    Baseline 18.2 seconds; 10/05/20 10.9s    Time 4    Period Weeks    Status Achieved    Target Date 09/16/20      PT SHORT TERM GOAL #5   Title Pt will increase gait velocity to >/= 2.8 ft/sec without AD and with decreased antalgic gait sequence    Baseline  2.46 ft/sec; 10/05/20 0.76 m/s or 2.49 ft/s; 10/29/18 Gait speed 2.33 ft/s; 12/09/20 2/79 ft/s or 0.85 m/s    Time 4    Period Weeks    Status Achieved    Target Date 09/16/20               PT Long Term Goals - 01/17/21 1142       PT LONG TERM GOAL #1   Title Pt will be independent with final HEP and walking program at [facility]    Baseline Patient has returnes to [facility] walking and performing chair yoga    Time 4    Period Weeks    Status Achieved    Target Date 01/06/21      PT LONG TERM GOAL #2   Title Pt will negotiate 16 stairs with one rail, alternating sequence MOD I    Baseline 10/19/20 Patient able to negotiate 16 steps with step throug pattern with B rails; 01/17/21 Able to negotiate 16 steps with step through patteren, verblly onfirmed.    Time 4    Period Weeks    Status Achieved    Target Date 01/06/21      PT LONG TERM GOAL #3   Title Pt will demonstrate symmetrical hamstring length and hip extension (Thomas test) L to R side    Baseline 10/19/20 B hamstring flexibility esentiall equal, 90d B SLR, Thomas test ROM also equal upon observation  Time 8    Period Weeks    Status Achieved    Target Date 01/06/21      PT LONG TERM GOAL #4   Title Pt will decrease five time sit to stand to </=13 seconds to indicate decreased falls risk    Baseline 10/19/20 5x STS in 12.6s    Time 4    Period Weeks    Status Achieved      PT LONG TERM GOAL #5   Title Pt will increase gait velocity to >/= 3.0 ft/sec without AD    Baseline 10/19/20 gait velocity 0.68 m/s or 2.23 ft/s    Time 4    Period Weeks    Status Partially Met    Target Date 02/14/21      Additional Long Term Goals   Additional Long Term Goals Yes      PT LONG TERM GOAL #6   Title Decrease L hip flexor/psoas tenderness from 3/5 to 1/5    Baseline 3/5 tendernes in L psoas    Time 4    Period Weeks    Target Date 02/14/21             Patient will benefit from skilled therapeutic intervention in  order to improve the following deficits and impairments:     Visit Diagnosis: Sciatica, left side  Muscle weakness (generalized)  Pain in left leg     Problem List Patient Active Problem List   Diagnosis Date Noted   Amaurosis fugax, both eyes 01/18/2021   Transient diplopia 01/18/2021   Hereditary and idiopathic neuropathy, unspecified 11/20/2020   Hyperglycemia due to type 2 diabetes mellitus ([place]) 11/20/2020   Obesity 11/20/2020   Pure hypercholesterolemia 11/20/2020   Syncope 05/26/2020   Back pain 01/75/1025   Acute metabolic encephalopathy 85/27/7824   Lumbar spinal stenosis 08/15/2019   Nonspecific chest pain 08/11/2019   Shortness of breath    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 ([place]) 10/21/2018   Nocturnal hypoxemia 10/21/2018   Radiation therapy complication 23/53/6144   Primary prostate cancer ([place]) 07/31/2017   Anemia 07/31/2017   Vertigo due to cerebrovascular disease 07/31/2017   Poor compliance with CPAP treatment 07/31/2017   Absolute anemia 05/16/2017   Avitaminosis D 05/16/2017   Benign essential HTN 05/16/2017   Benign prostatic hypertrophy without urinary obstruction 05/16/2017   Clinical depression 05/16/2017   CN (constipation) 05/16/2017   Current drug use 05/16/2017   Diabetic neuropathy ([place]) 05/16/2017   Genital herpes 05/16/2017   Infarction of lung due to iatrogenic pulmonary embolism ([place]) 05/16/2017   Sciatica associated with disorder of lumbar spine 05/16/2017   Arteriosclerosis of coronary artery 05/16/2017   Artery disease, cerebral 05/16/2017   Apnea, sleep 05/16/2017   Arthralgia of hip or thigh 05/16/2017   Malignant neoplasm of prostate ([place]) 09/28/2016   Vascular dementia in remission ([place]) 09/28/2016   Remote history of stroke 09/28/2016   Acute kidney injury ([place]) 05/18/2016   Dehydration 05/18/2016   Near syncope 05/18/2016   Orthostatic hypotension 05/18/2016   CKD (chronic kidney disease), stage II  04/26/2016   UTI (urinary tract infection) 04/26/2016   Encounter for counseling on use of CPAP 11/18/2015   Chronic cholecystitis with calculus 11/02/2015   Stroke, 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[place]) 04/22/2015   TIA (transient ischemic attack) 11/03/2014   OSA on CPAP 05/18/2014   White matter disease 08/14/2013   Abnormal x-ray of temporomandibular joint 05/30/2013   Chronic infection of sinus 05/30/2013   Cough 05/30/2013   Fatigue  05/30/2013   Unsteady gait 05/19/2013   Combined fat and carbohydrate induced hyperlipemia 04/24/2013   Syncope 03/20/2013   Angina pectoris ([place]) 10/08/2012   Hypertension    Stroke [facility])    History of TIAs    Lumbago    Other and unspecified hyperlipidemia    Personal history of unspecified circulatory disease    Unwitnessed fall    Pain in joint, multiple sites    Degeneration of cervical intervertebral disc    Unspecified cardiovascular disease    History of pulmonary embolism: June 2014,  Takes Xarelto 07/01/2012    Class: History of   Hemiparesis ([place]) 07/18/2011   Diabetes mellitus type 2 with complications ([place]) 25/18/9842   CAD in native artery 07/18/2011   History of recurrent TIAs 07/18/2011    Lanice Shirts, PT 02/15/2021, 1:00 PM  [facility] [address] [place], [place], [address] Phone: [phone]   Fax:  [phone]  Name: Juanantonio Stolar MRN: [id number] Date of Birth: 1941-01-08

## 2021-02-22 ENCOUNTER — Other Ambulatory Visit: Payer: Self-pay

## 2021-02-22 ENCOUNTER — Ambulatory Visit: Payer: Medicare HMO

## 2021-02-22 DIAGNOSIS — M6281 Muscle weakness (generalized): Secondary | ICD-10-CM

## 2021-02-22 DIAGNOSIS — M5432 Sciatica, left side: Secondary | ICD-10-CM

## 2021-02-22 DIAGNOSIS — M79605 Pain in left leg: Secondary | ICD-10-CM

## 2021-02-22 NOTE — Therapy (Signed)
[place], [place], [address] Phone: [phone]   Fax:  [phone]  Physical Therapy Treatment/DC Summary  Patient Details  Name: Steve Brown MRN: [id number] Date of Birth: 12-Sep-1940 Referring Provider (PT): Lawerance Cruel, MD   Encounter Date: 02/22/2021  PHYSICAL THERAPY DISCHARGE SUMMARY  Visits from Start of Care: 25  Current functional level related to goals / functional outcomes: Goals met   Remaining deficits: Mild pain with muscle tightness   Education / Equipment: HEP   Patient agrees to discharge. Patient goals were met. Patient is being discharged due to maximized rehab potential.     PT End of Session - 02/22/21 1212     Visit Number 25    Number of Visits 27    Date for PT Re-Evaluation 03/08/21    Authorization Type Medicare A&B; 10th visit PN    Authorization Time Period Pending re-auth    Progress Note Due on Visit 25    PT Start Time 1215    PT Stop Time 1300    PT Time Calculation (min) 45 min    Activity Tolerance Patient tolerated treatment well    Behavior During Therapy WFL for tasks assessed/performed             Past Medical History:  Diagnosis Date   Abnormal prostate biopsy    Anticoagulant long-term use    currently xarelto   BPH with elevated PSA    CKD (chronic kidney disease), stage II    Complication of anesthesia    limted neck rom limited use of left arm due to cva   Coronary artery disease    CARDIOLOGIST-  DR Irish Lack--  2010-- PCI w/ stenting midLAD   DDD (degenerative disc disease), lumbar    Degeneration of cervical intervertebral disc    Depression    Diabetes mellitus without complication (HCC)    Dyspnea on exertion    GERD (gastroesophageal reflux disease)    Hemiparesis due to cerebral infarction    History of cerebrovascular accident (CVA) with residual deficit 2002 and 2003--  hemiparisis both sides   per MRI  anterior  left frontal lobe, left para midline pons, and inferior cerebullam bilaterally infarcts   History of pulmonary embolus (PE)    06-30-2012  extensive bilaterally   History of recurrent TIAs    History of syncope    hx multiple pre-syncope and syncopal episodes due to vasovagal, orthostatic hypotension, dehydration   History of TIAs    several since 2002   Hyperlipidemia    Hypertension    Mild atherosclerosis of carotid artery, bilateral    per last duplex 11-04-2014  bilateral ICA 1--39%   Neuropathy    fingers   OSA on CPAP    followed by dr dohmeier--  sev. osa w/ AHI 65.9   Prostate cancer ([place]) dx 2018   Renal insufficiency    S/P coronary artery stent placement 2010   stenting to mid LAD   Simple renal cyst    bilaterally   Stroke [facility])    Trigger finger of both hands 11-17-13   Type 2 diabetes mellitus ([place]) dx 1986   last one A1c 9.2 on 04-26-2016   Unsteady gait    . Hx prior CVA/TIAs;   Vertebral artery occlusion, left    chronic    Past Surgical History:  Procedure Laterality Date   ANTERIOR CERVICAL DECOMP/DISCECTOMY FUSION  2004   C3 -- C6 limited rom   CARDIAC  CATHETERIZATION  06-10-2010   dr Irish Lack   wide patent LAD stent, mid lesion at the origin of the septal prior to the previous stent 40-50%/  normal LVF, ef 55%   CARDIOVASCULAR STRESS TEST  10-23-2012  dr Irish Lack   normal nuclear perfusion study w/ no ischemia/  normal LV function and wall motion , ef 65%   CARPAL TUNNEL RELEASE Bilateral    CATARACT EXTRACTION W/ INTRAOCULAR LENS  IMPLANT, BILATERAL     CHOLECYSTECTOMY N/A 11/02/2015   Procedure: LAPAROSCOPIC CHOLECYSTECTOMY WITH INTRAOPERATIVE CHOLANGIOGRAM;  Surgeon: Donnie Mesa, MD;  Location: [facility];  Service: General;  Laterality: N/A;   COLONOSCOPY     CORONARY ANGIOPLASTY WITH STENT PLACEMENT  02/2008   stenting to mid LAD   GOLD SEED IMPLANT N/A 11/15/2016   Procedure: [place];  Surgeon: Ardis Hughs, MD;   Location: [facility];  Service: Urology;  Laterality: N/A;   IR ANGIO INTRA EXTRACRAN SEL COM CAROTID INNOMINATE BILAT MOD SED  06/13/2018   IR ANGIO VERTEBRAL SEL VERTEBRAL UNI R MOD SED  06/13/2018   IR US GUIDE VASC ACCESS RIGHT  06/13/2018   LEFT HEART CATH AND CORONARY ANGIOGRAPHY N/A 05/25/2017   Procedure: LEFT HEART CATH AND CORONARY ANGIOGRAPHY;  Surgeon: Jettie Booze, MD;  Location: [facility];  Service: Cardiovascular;  Laterality: N/A;   LEFT HEART CATHETERIZATION WITH CORONARY ANGIOGRAM N/A 04/03/2013   Procedure: LEFT HEART CATHETERIZATION WITH CORONARY ANGIOGRAM;  Surgeon: Jettie Booze, MD;  Location: [facility] CATH LAB;  Service: Cardiovascular;  Laterality: N/A;  patent mLAD stent  w/ mild disease in remainder LAD and its branches;  mod. focal lesion midLCFx- FFR of lesion was negative for ischemia/  normal LVSF, ef 50%   lungs  2005   "fluid pumped off lungs"   NEUROPLASTY / TRANSPOSITION ULNAR NERVE AT ELBOW Right 2004   PROSTATE BIOPSY N/A 08/31/2016   Procedure: PROSTATE  BIOPSY TRANSRECTAL ULTRASONIC PROSTATE (TUBP);  Surgeon: Ardis Hughs, MD;  Location: [facility];  Service: Urology;  Laterality: N/A;   SPACE OAR INSTILLATION N/A 11/15/2016   Procedure: SPACE OAR INSTILLATION;  Surgeon: Ardis Hughs, MD;  Location: [facility];  Service: Urology;  Laterality: N/A;   TRANSTHORACIC ECHOCARDIOGRAM  04/27/2016   severe focal basal LVH, ef 60-65%,  grade 2 diastoilc dysfunction/  mild AR, MR, and TR/  atrial septum lipomatous hypertrophy/  PASP [id number]   UMBILICAL HERNIA REPAIR      There were no vitals filed for this visit.   Subjective Assessment - 02/22/21 1215     Subjective Has begun OPPT for balance at separate clinic.    Pertinent History Long-term anticoagulant use, BPH with elevated PSA, CKD, CAD, DDD lumbar, degeneration of cervical intervertebral disc, depression, DM, DOE, hemiparesis due to CVA,  multiple CVA, ACDF C3-C6, bilat carpal tunnel release, PE, h/o syncope, h/o TIA, HLD, HTN, neuropathy, OSA, prostate CA, Vertebral artery occlusion, neuroplasty/transposition ulnar nn at elbow    Limitations Walking;Standing    How long can you sit comfortably? unimited    How long can you stand comfortably? <15 minutes    How long can you walk comfortably? <15 minutes    Patient Stated Goals To relieve LLE pain, improve walking    Currently in Pain? Yes    Pain Score 4     Pain Onset More than a month ago  [facility] Adult PT Treatment:                                                DATE: 02/22/21 Therapeutic Exercise: L hip flexor stretch 2 min duration L SLR 15x L FABER positive for low back/hip symptoms Gait speed 71f/s or 0.917m 5x STS 10s Manual Therapy: TP release to L psoas/hip flexors 8 min.    PT Short Term Goals - 12/09/20 1540       PT SHORT TERM GOAL #1   Title Pt will demonstrate independence with updated and revised HEP    Baseline Independent in current HEP    Time 4    Period Weeks    Status Achieved    Target Date 09/16/20      PT SHORT TERM GOAL #2   Title Pt will demonstrate 10 degrees increase in L hamstring ROM and L hip flexor ROM    Baseline 60 deg hamstring, lacking 20 deg to neutral hip extension LLE Thomas test; 08/30/20 L hamstring ROM 90d in supine; 10/05/20 90d in supine    Time 4    Period Weeks    Status Achieved    Target Date 09/16/20      PT SHORT TERM GOAL #3   Title Pt will participate in stair negotiation assessment with LTG to be set    Baseline TBD; 10/05/20 Able to negotiate a full flight of steps w/o need of rails with step through pattern    Time 4    Period Weeks    Status Achieved    Target Date 09/16/20      PT SHORT TERM GOAL #4   Title Pt will decrease five time sit to stand to </= 15 seconds without UE support    Baseline 18.2 seconds; 10/05/20 10.9s    Time 4    Period Weeks    Status Achieved    Target Date  09/16/20      PT SHORT TERM GOAL #5   Title Pt will increase gait velocity to >/= 2.8 ft/sec without AD and with decreased antalgic gait sequence    Baseline 2.46 ft/sec; 10/05/20 0.76 m/s or 2.49 ft/s; 10/29/18 Gait speed 2.33 ft/s; 12/09/20 2/79 ft/s or 0.85 m/s    Time 4    Period Weeks    Status Achieved    Target Date 09/16/20               PT Long Term Goals - 02/22/21 1219       PT LONG TERM GOAL #1   Title Pt will be independent with final HEP [facility]    Time 4    Period Weeks    Status Achieved    Target Date 01/06/21      PT LONG TERM GOAL #2   Title Pt will negotiate 16 stairs with one rail, alternating sequence MOD I    Baseline 10/19/20 Patient able to negotiate 16 steps with step throug pattern with B rails; 01/17/21 Able to negotiate 16 steps with step through patteren, verblly onfirmed.    Time 4    Period Weeks    Status Achieved    Target Date 01/06/21      PT LONG TERM GOAL #3   Title  Pt will demonstrate symmetrical hamstring length and hip extension (Thomas test) L to R side    Baseline 10/19/20 B hamstring flexibility esentiall equal, 90d B SLR, Thomas test ROM also equal upon observation    Time 8    Period Weeks    Status Achieved    Target Date 01/06/21      PT LONG TERM GOAL #4   Title Pt will decrease five time sit to stand to </=13 seconds to indicate decreased falls risk    Baseline 10/19/20 5x STS in 12.6s; 02/22/21 5x STS 10s    Time 4    Period Weeks    Status Achieved      PT LONG TERM GOAL #5   Title Pt will increase gait velocity to >/= 3.0 ft/sec without AD    Baseline 10/19/20 gait velocity 0.68 m/s or 2.23 ft/s; 02/22/21 Gait speed 3.75f/s    Time 4    Period Weeks    Status Achieved    Target Date 02/14/21      PT LONG TERM GOAL #6   Title Decrease L hip flexor/psoas tenderness from 3/5 to 1/5    Baseline 3/5 tendernes in L psoas;  02/22/21 1/5 tenderness in L hip flexor/psoas    Time 4    Period Weeks    Status Achieved    Target Date 02/14/21                   Plan - 02/22/21 1217     Clinical Impression Statement Patient has regained maximum benefit from OPPT.  Will continue to seek care for balance issues related to TIA at alternate clinic.  All gols met, 5x STS and gait speed within normative values    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    Examination-Activity Limitations Bend;Locomotion Level;Stairs;Stand    Examination-Participation Restrictions Community Activity;Other    Stability/Clinical Decision Making Evolving/Moderate complexity    Rehab Potential Good    PT Frequency 1x / week    PT Duration 4 weeks    PT Treatment/Interventions ADLs/Self Care Home Management;Aquatic Therapy;DME Instruction;Gait training;Stair training;Functional mobility training;Therapeutic activities;Therapeutic exercise;Balance training;Neuromuscular re-education;Patient/family education;Orthotic Fit/Training;Cryotherapy;Moist Heat;Manual techniques;Passive range of motion;Dry needling;Taping    PT Next Visit Plan DC to HEP    PT Home Exercise Plan [email or web address]    Consulted and Agree with Plan of Care Patient             Patient will benefit from skilled therapeutic intervention in order to improve the following deficits and impairments:  Decreased activity tolerance, Decreased balance, Cardiopulmonary status limiting activity, Decreased endurance, Decreased range of motion, Decreased strength, Difficulty walking,  Impaired sensation, Postural dysfunction, Pain  Visit Diagnosis: Sciatica, left side  Muscle weakness (generalized)  Pain in left leg     Problem List Patient Active Problem List   Diagnosis Date Noted   Amaurosis fugax, both eyes 01/18/2021   Transient diplopia 01/18/2021   Hereditary and idiopathic neuropathy, unspecified 11/20/2020   Hyperglycemia due to type 2 diabetes mellitus ([place] 11/20/2020   Obesity 11/20/2020   Pure hypercholesterolemia 11/20/2020   Syncope 05/26/2020   Back pain 001/09/3233  Acute metabolic encephalopathy 057/32/2025  Lumbar spinal stenosis 08/15/2019   Nonspecific chest pain 08/11/2019   Shortness of breath    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 (HCC) 10/21/2018   Nocturnal hypoxemia 10/21/2018   Radiation therapy complication 13/08/6576   Primary prostate cancer ([place]) 07/31/2017   Anemia 07/31/2017   Vertigo due to cerebrovascular disease 07/31/2017   Poor compliance with CPAP treatment 07/31/2017   Absolute anemia 05/16/2017   Avitaminosis D 05/16/2017   Benign essential HTN 05/16/2017   Benign prostatic hypertrophy without urinary obstruction 05/16/2017   Clinical depression 05/16/2017   CN (constipation) 05/16/2017   Current drug use 05/16/2017   Diabetic neuropathy ([place]) 05/16/2017   Genital herpes 05/16/2017   Infarction of lung due to iatrogenic pulmonary embolism ([place]) 05/16/2017   Sciatica associated with disorder of lumbar spine 05/16/2017   Arteriosclerosis of coronary artery 05/16/2017   Artery disease, cerebral 05/16/2017   Apnea, sleep 05/16/2017   Arthralgia of hip or thigh 05/16/2017   Malignant neoplasm of prostate ([place]) 09/28/2016   Vascular dementia in remission ([place]) 09/28/2016   Remote history of stroke 09/28/2016   Acute kidney injury ([place]) 05/18/2016   Dehydration 05/18/2016   Near syncope 05/18/2016   Orthostatic hypotension 05/18/2016   CKD (chronic kidney disease), stage II 04/26/2016    UTI (urinary tract infection) 04/26/2016   Encounter for counseling on use of CPAP 11/18/2015   Chronic cholecystitis with calculus 11/02/2015   Stroke, 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[place]) 04/22/2015   TIA (transient ischemic attack) 11/03/2014   OSA on CPAP 05/18/2014   White matter disease 08/14/2013   Abnormal x-ray of temporomandibular joint 05/30/2013   Chronic infection of sinus 05/30/2013   Cough 05/30/2013   Fatigue 05/30/2013   Unsteady gait 05/19/2013   Combined fat and carbohydrate induced hyperlipemia 04/24/2013   Syncope 03/20/2013   Angina pectoris ([place]) 10/08/2012   Hypertension    Stroke ([place])    History of TIAs    Lumbago    Other and unspecified hyperlipidemia    Personal history of unspecified circulatory disease    Unwitnessed fall    Pain in joint, multiple sites    Degeneration of cervical intervertebral disc    Unspecified cardiovascular disease    History of pulmonary embolism: June 2014,  Takes Xarelto 07/01/2012    Class: History of   Hemiparesis ([place]) 07/18/2011   Diabetes mellitus type 2 with complications ([place]) 46/96/2952   CAD in native artery 07/18/2011   History of recurrent TIAs 07/18/2011    Lanice Shirts, PT 02/22/2021, 1:04 PM  [facility] [address] [place], [place], [address] Phone: [phone]   Fax:  [phone]  Name: Dakai Braithwaite MRN: [id number] Date of Birth: [birth year]

## 2021-02-23 ENCOUNTER — Ambulatory Visit: Payer: Medicare Other | Admitting: Adult Health

## 2021-02-25 ENCOUNTER — Other Ambulatory Visit: Payer: Self-pay | Admitting: Adult Health

## 2021-02-25 DIAGNOSIS — E1149 Type 2 diabetes mellitus with other diabetic neurological complication: Secondary | ICD-10-CM

## 2021-04-01 DIAGNOSIS — H903 Sensorineural hearing loss, bilateral: Secondary | ICD-10-CM

## 2021-04-01 HISTORY — DX: Sensorineural hearing loss, bilateral: H90.3

## 2021-04-20 ENCOUNTER — Emergency Department (HOSPITAL_BASED_OUTPATIENT_CLINIC_OR_DEPARTMENT_OTHER)
Admission: EM | Admit: 2021-04-20 | Discharge: 2021-04-20 | Disposition: A | Discharge status: Home / Self Care | Payer: Medicare HMO | Attending: Emergency Medicine | Admitting: Emergency Medicine

## 2021-04-20 ENCOUNTER — Encounter (HOSPITAL_BASED_OUTPATIENT_CLINIC_OR_DEPARTMENT_OTHER): Payer: Self-pay

## 2021-04-20 ENCOUNTER — Other Ambulatory Visit: Payer: Self-pay

## 2021-04-20 DIAGNOSIS — E119 Type 2 diabetes mellitus without complications: Secondary | ICD-10-CM | POA: Insufficient documentation

## 2021-04-20 DIAGNOSIS — Z794 Long term (current) use of insulin: Secondary | ICD-10-CM | POA: Insufficient documentation

## 2021-04-20 DIAGNOSIS — M5442 Lumbago with sciatica, left side: Secondary | ICD-10-CM | POA: Diagnosis present

## 2021-04-20 DIAGNOSIS — G8929 Other chronic pain: Secondary | ICD-10-CM | POA: Insufficient documentation

## 2021-04-20 DIAGNOSIS — Z7901 Long term (current) use of anticoagulants: Secondary | ICD-10-CM | POA: Diagnosis not present

## 2021-04-20 MED ORDER — LIDOCAINE 5 % EX PTCH
1.0000 | MEDICATED_PATCH | CUTANEOUS | Status: DC
  Administered 2021-04-20: 1 via TRANSDERMAL
  Filled 2021-04-20: qty 1

## 2021-04-20 MED ORDER — OXYCODONE HCL 5 MG PO TABS: 5.0000 mg | ORAL_TABLET | ORAL | 0 refills | Status: DC | PRN

## 2021-04-20 NOTE — ED Triage Notes (Signed)
C/o sciatica pain from back down left leg. States has had cortisone shots that have worn off. ?

## 2021-04-20 NOTE — ED Provider Notes (Signed)
?[facility] EMERGENCY DEPARTMENT ?Provider Note ? ? ?CSN: [id number] ?Arrival date & time: 04/20/21  1259 ? ?  ? ?History ? ?Chief Complaint  ?Patient presents with  ? Back Pain  ? ? ?Steve Brown is a 81 y.o. male. ? ? ?Back Pain ? ?Patient with medical history notable for diabetes, chronic pain syndrome, lumbar radiculitis, lumbar degenerative disc disease presents today with low back pain.  He is having sciatica down his left lower extremity, started 2 weeks ago and its been pretty constant.  He has episodes like this multiple times throughout the years, he is currently in physical therapy twice daily at [facility] therapy [place].  States he has had relief in the past with lumbar transform a normal epidural steroid injections the last he was provided on 03/10/2021.  He denies any recent injuries or trauma to the back, unsure what caused the flareup.  He has tried Tylenol without any relief, states in the past has been given Tylenol with codeine or oxycodone which have helped.  He states she has tried prednisone in the past but it does not really help with the pain and it increases his hyperglycemia so he is not supposed to be taking that.  He is also tried Voltaren gel at home with mild benefit. ? ?Home Medications ?Prior to Admission medications   ?Medication Sig Start Date End Date Taking? Authorizing Provider  ?acetaminophen (TYLENOL) 500 MG tablet Take 500 mg by mouth every 6 (six) hours as needed (pain).    [provider]  ?albuterol (VENTOLIN HFA) 108 (90 Base) MCG/ACT inhaler Inhale 1-2 puffs into the lungs every 6 (six) hours as needed for wheezing or shortness of breath. 09/06/20   Ward, Lenise Arena, PA-C  ?bisacodyl (DULCOLAX) 10 MG suppository Place 1 suppository (10 mg total) rectally daily as needed for moderate constipation. 05/31/20   Antonieta Pert, MD  ?diclofenac Sodium (VOLTAREN) 1 % GEL Apply 4 g topically 4 (four) times daily. 09/03/19   Deno Etienne, DO  ?fluticasone (FLONASE) 50  MCG/ACT nasal spray Place 1 spray into both nostrils daily as needed for allergies. 06/13/20   Medina-Vargas, Monina C, NP  ?furosemide (LASIX) 20 MG tablet Take 1-2 tablets (20-40 mg total) by mouth daily as needed for fluid. 06/13/20   Medina-Vargas, Monina C, NP  ?hydrochlorothiazide (MICROZIDE) 12.5 MG capsule Take 1 capsule (12.5 mg total) by mouth daily. 06/13/20   Medina-Vargas, Monina C, NP  ?insulin aspart (NOVOLOG) 100 UNIT/ML FlexPen Inject 5 Units into the skin 3 (three) times daily with meals. 06/13/20   Medina-Vargas, Monina C, NP  ?losartan (COZAAR) 100 MG tablet Take 1 tablet (100 mg total) by mouth daily. 06/13/20   Medina-Vargas, Monina C, NP  ?methocarbamol (ROBAXIN) 500 MG tablet Take 1 tablet (500 mg total) by mouth every 8 (eight) hours as needed for muscle spasms. 06/13/20   Medina-Vargas, Monina C, NP  ?modafinil (PROVIGIL) 100 MG tablet Take 1 tablet (100 mg total) by mouth daily. 08/23/20   Ward Givens, NP  ?oxyCODONE (OXY IR/ROXICODONE) 5 MG immediate release tablet Take 1 tablet (5 mg total) by mouth every 6 (six) hours as needed for up to 6 doses for severe pain. 06/08/20   Medina-Vargas, Monina C, NP  ?pantoprazole (PROTONIX) 40 MG tablet Take 1 tablet (40 mg total) by mouth every morning. 06/13/20   Medina-Vargas, Monina C, NP  ?polyethylene glycol (MIRALAX / GLYCOLAX) 17 g packet Take 17 g by mouth daily. 05/31/20   Antonieta Pert, MD  ?pregabalin (  LYRICA) 300 MG capsule Take 300 mg by mouth 2 (two) times daily. 04/26/20   [provider]  ?rosuvastatin (CRESTOR) 20 MG tablet Take 1 tablet (20 mg total) by mouth daily. 11/29/20   Jettie Booze, MD  ?sitaGLIPtin (JANUVIA) 50 MG tablet Take 1 tablet (50 mg total) by mouth daily. 06/13/20   Medina-Vargas, Monina C, NP  ?TRESIBA FLEXTOUCH 100 UNIT/ML FlexTouch Pen Inject 34 Units into the skin daily. 06/13/20   Medina-Vargas, Senaida Lange, NP  ?XARELTO 20 MG TABS tablet Take 1 tablet (20 mg total) by mouth daily. 06/13/20   Medina-Vargas, Monina C,  NP  ?   ? ?Allergies    ?Other, Phenergan [promethazine], Iohexol, Iohexol, Phenergan [promethazine], and Tramadol   ? ?Review of Systems   ?Review of Systems  ?Musculoskeletal:  Positive for back pain.  ? ?Physical Exam ?Updated Vital Signs ?BP (!) 143/80 (BP Location: Left Arm)   Pulse 63   Temp 98.1 ?F (36.7 ?C) (Oral)   Resp 18   Ht '5\' 5"'$  (1.651 m)   Wt 93 kg   SpO2 97%   BMI 34.11 kg/m?  ?Physical Exam ?Vitals and nursing note reviewed. Exam conducted with a chaperone present.  ?Constitutional:   ?   General: He is not in acute distress. ?   Appearance: Normal appearance.  ?HENT:  ?   Head: Normocephalic and atraumatic.  ?Eyes:  ?   General: No scleral icterus. ?   Extraocular Movements: Extraocular movements intact.  ?   Pupils: Pupils are equal, round, and reactive to light.  ?Musculoskeletal:     ?   General: Tenderness present.  ?   Comments: No midline tenderness, positive straight leg sign to the left.  ?Skin: ?   Coloration: Skin is not jaundiced.  ?Neurological:  ?   Mental Status: He is alert. Mental status is at baseline.  ?   Coordination: Coordination normal.  ?   Comments: Cranial nerves III through XII are grossly intact.  Patient is ambulatory with steady gait.  ? ? ?ED Results / Procedures / Treatments   ?Labs ?(all labs ordered are listed, but only abnormal results are displayed) ?Labs Reviewed - No data to display ? ?EKG ?None ? ?Radiology ?No results found. ? ?Procedures ?Procedures  ? ? ?Medications Ordered in ED ?Medications - No data to display ? ?ED Course/ Medical Decision Making/ A&P ?  ?                        ?Medical Decision Making ? ?This is a 81 year old man presenting today with low back pain/sciatica.  Differential diagnosis includes but is not limited to acute on chronic pain, sciatica, epidural abscess, cauda equina, compression fracture.  I reviewed external records including his most recent visit with spine and pain management clinic, please see HPI for further  details. ? ?Physical exam is overall reassuring, patient positive straight leg raise consistent with a sciatica.  There are no new focal deficits and he is ambulatory with steady gait.  Given age anti-inflammatory is not a good option, also consider steroids but given he is diabetic would prefer not to increase his sugars.  He is in the process of arranging follow-up appointment with his orthopedic doctor this month.  We will give a short course of oxycodone to help with immediate pain encouraged him to continue taking the pregabalin and using the Voltaren gel at home. ? ?Considered imaging but given there is no new  trauma or focal deficits I do not think would be beneficial.  Considered epidural abscess but does not have risk factors, additionally given lack of trauma cauda equina is highly unlikely. ? ? ? ? ? ? ? ?Final Clinical Impression(s) / ED Diagnoses ?Final diagnoses:  ?None  ? ? ?Rx / DC Orders ?ED Discharge Orders   ? ? None  ? ?  ? ? ?  ?Sherrill Raring, PA-C ?04/20/21 1858 ? ?  ?Drenda Freeze, MD ?04/20/21 2109 ? ?

## 2021-04-20 NOTE — Discharge Instructions (Addendum)
Take the oxydone sparingly for severe pain, up to every four hours. Continue the pregablin and using the volteran gel. Follow up with your chronic pain physician this week.  ?

## 2021-05-09 DIAGNOSIS — R270 Ataxia, unspecified: Secondary | ICD-10-CM | POA: Diagnosis not present

## 2021-05-09 DIAGNOSIS — R2681 Unsteadiness on feet: Secondary | ICD-10-CM | POA: Diagnosis not present

## 2021-05-10 DIAGNOSIS — K219 Gastro-esophageal reflux disease without esophagitis: Secondary | ICD-10-CM | POA: Diagnosis not present

## 2021-05-10 DIAGNOSIS — I10 Essential (primary) hypertension: Secondary | ICD-10-CM | POA: Diagnosis not present

## 2021-05-10 DIAGNOSIS — E782 Mixed hyperlipidemia: Secondary | ICD-10-CM | POA: Diagnosis not present

## 2021-05-10 DIAGNOSIS — N183 Chronic kidney disease, stage 3 unspecified: Secondary | ICD-10-CM | POA: Diagnosis not present

## 2021-05-10 DIAGNOSIS — E1140 Type 2 diabetes mellitus with diabetic neuropathy, unspecified: Secondary | ICD-10-CM | POA: Diagnosis not present

## 2021-05-11 DIAGNOSIS — R270 Ataxia, unspecified: Secondary | ICD-10-CM | POA: Diagnosis not present

## 2021-05-11 DIAGNOSIS — R2681 Unsteadiness on feet: Secondary | ICD-10-CM | POA: Diagnosis not present

## 2021-05-13 DIAGNOSIS — E1165 Type 2 diabetes mellitus with hyperglycemia: Secondary | ICD-10-CM | POA: Diagnosis not present

## 2021-05-16 DIAGNOSIS — R2681 Unsteadiness on feet: Secondary | ICD-10-CM | POA: Diagnosis not present

## 2021-05-16 DIAGNOSIS — R270 Ataxia, unspecified: Secondary | ICD-10-CM | POA: Diagnosis not present

## 2021-05-18 ENCOUNTER — Ambulatory Visit (INDEPENDENT_AMBULATORY_CARE_PROVIDER_SITE_OTHER): Payer: Medicare Other | Admitting: Neurology

## 2021-05-18 ENCOUNTER — Encounter: Payer: Self-pay | Admitting: Neurology

## 2021-05-18 VITALS — BP 107/61 | HR 60 | Ht 65.0 in | Wt 204.0 lb

## 2021-05-18 DIAGNOSIS — H539 Unspecified visual disturbance: Secondary | ICD-10-CM

## 2021-05-18 DIAGNOSIS — Z8673 Personal history of transient ischemic attack (TIA), and cerebral infarction without residual deficits: Secondary | ICD-10-CM | POA: Insufficient documentation

## 2021-05-18 DIAGNOSIS — H532 Diplopia: Secondary | ICD-10-CM

## 2021-05-18 DIAGNOSIS — R42 Dizziness and giddiness: Secondary | ICD-10-CM | POA: Diagnosis not present

## 2021-05-18 DIAGNOSIS — I6930 Unspecified sequelae of cerebral infarction: Secondary | ICD-10-CM | POA: Diagnosis not present

## 2021-05-18 DIAGNOSIS — R270 Ataxia, unspecified: Secondary | ICD-10-CM | POA: Diagnosis not present

## 2021-05-18 DIAGNOSIS — R2681 Unsteadiness on feet: Secondary | ICD-10-CM | POA: Diagnosis not present

## 2021-05-18 NOTE — Progress Notes (Signed)
PATIENT: Steve Brown DOB: 05-19-1940  REASON FOR VISIT:   Pt referred by dr  Melinda Crutch, MD following a [facility] ED visit-  ED visit doesn't mention any dizziness, vision changes only back pain-  Referral for ongoing dizziness for the last 3 months. Low blood pressure . Pt reports dizziness while sitting, while driving, especially when overtaken by  larger vehicles or driving over a bridge. . At the time also notices blurred vision.  These are further described as wavy lines, and may be what causes the dizziness sensation. He has an appointment  with ENT / Audiology. Ophthalmologist did reportedly state- eye health all OK. Dr Harrington Challenger reduced the dose of losartan. BP here today 107/ 61 mmHg. Regular heart rate.    Interval history : 01-18-2021:  transient Bilateral blindness, transient followed by horizontal diplopia, 11/ 2022.  Here for follow up after normal angio CT in ED. ED visit recently-  turns out ED visit was in November 2022- Followed by CT head and CT angiogram.   18 January 2021:  Mr Karson Reede was originally seen here as a sleep medicine patient, and he is using his CPAP compliantly. See attached data sheet to today's visit.  The patient endorsed today the FSS fatigue severity score at 38/ 63 points, epworth 4-5 points only. Compliance by days 86% average user time average user time 5 hours and 37 minutes on days used 75% also days he used his machine over 4 hours.  Set pressure at 13 cmH2O with 1 cm EPR, residual AHI is 3.2.  High air leak.  Possibly some central apneas present as well.   This is his last sleep study; IMPRESSION:  1. Complex, mostly Obstructive Sleep Apnea (OSA) a6t AHI of 48.1/  h - high severity.  2. Primary Snoring  3. Some Central and Mixed Sleep Apnea was present.    4. Prolonged Hypoxemia, most severe desaturation during REM sleep  ( see screen shots)     RECOMMENDATIONS:   1. Advise full-night, attended, CPAP titration study to  optimize  therapy. He has done well on 13 cm water pressure, but may have  remained hypoxic under CPAP therapy. I will request oxygen  supplementation in that case.    Larey Seat, MD   09-07-2018   Interval history : ED visit recently-  turns out ED visit was in November 2022- Followed by CT head and CT angiogram.  I reviewed the medication and he is indeed on Xarelto.  20 mg daily. He often complains of dizziness . Pt states he has had dizzness for 2-3 months-  but new is another symptom- he was watching TV and couldn't see anything everything was black for 15 minutes -he went to the ER where a head CT scan was done. This sounds much more like an amaurosis fugax, but affecting both eyes?  He couldn't get up from the recliner- once the vision came back ,he had diplopia for 15 minutes.  He has had CVAs in the past.  Has been diagnosed with vertigo related to CVA.  CT angio was negative, co-incidental  finding of chronic sinusitis.  Patient is on Eloquis and can't take additional ASA.   in short,  we knew that Mr. Wayland Baik has posterior circulation problems, he knew that he had a cerebellar stroke in the past and a vertebral artery occlusion.  This explains most of his dizziness especially when visible to general the diagnosis of cerebrovascular disease has been made  in the year 2010 or 2011 for the first time.  Diplopia justifies looking into MG ab. He reports horizontal diplopia.   [name]:  Brain: There is no mass, hemorrhage or extra-axial collection. There is generalized atrophy without lobar predilection. There is an old left cerebellar infarct. There is hypoattenuation of the periventricular white matter, most commonly indicating chronic ischemic microangiopathy.  Skull: The visualized skull base, calvarium and extracranial soft tissues are normal.  Sinuses/Orbits: Chronic opacification of the right sphenoid sinus. The orbits are normal.  CTA NECK  FINDINGS  SKELETON: There is no bony spinal canal stenosis. No lytic or blastic lesion.  OTHER NECK: Normal pharynx, larynx and major salivary glands. No cervical lymphadenopathy. Unremarkable thyroid gland.  UPPER CHEST: No pneumothorax or pleural effusion. No nodules or masses.  AORTIC ARCH:  There is calcific atherosclerosis of the aortic arch. There is no aneurysm, dissection or hemodynamically significant stenosis of the visualized portion of the aorta. Conventional 3 vessel aortic branching pattern. The visualized proximal subclavian arteries are widely patent.  RIGHT CAROTID SYSTEM: No dissection, occlusion or aneurysm. There is calcific atherosclerosis extending into the proximal ICA, resulting in less than 50% stenosis.  LEFT CAROTID SYSTEM: No dissection, occlusion or aneurysm. There is calcific atherosclerosis extending into the proximal ICA, resulting in less than 50% stenosis.  VERTEBRAL ARTERIES: Right dominant configuration. Left vertebral artery is occluded at its origin with reconstitution of the distal V2 segment. The V3 segment is patent. Right vertebral artery is normal.  CTA HEAD FINDINGS  POSTERIOR CIRCULATION:  -Normal.  ANTERIOR CIRCULATION:  -- Normal.  VENOUS SINUSES: As permitted by contrast timing, patent.  ANATOMIC VARIANTS: None  Review of the MIP images confirms the above findings.  IMPRESSION: 1. No intracranial arterial occlusion or high-grade stenosis. 2. Occlusion of the left vertebral artery at its origin with reconstitution of the distal V2 segment.  Aortic Atherosclerosis (ICD10-I70.0).   Electronically Signed By: Ulyses Jarred M.D. On: 11/26/2020 22:57  ENT office - Sallee Provencal, PA-C sent at 01/11/2021 11:09 AM EST -----  His sinus CT scan demonstrates stable to slight worsening of chronic sinusitis. Given his recent changes in vision and worsening radiographic findings, I recommend follow up with one of my physician  colleagues. I have also referred him to [facility] for dizziness and vision changes.  01-18-2021. His remote stroke left cerebellar was confirmed as originating near the PICA through vascular occlusion of the vertebral artery. Angiography confirms this indirect as cause of the balance problems.  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 Caroline Sauger at [facility] neuro-vascular clinic. He reports  that his doctor had suddenly died, and therefor no longer follows him. He wants his stroke care locally, but should get it from a STROKE specialist.   Interval history 07-16-2018, Mr. Sheldon Sem was last seen in a virtual visit phone call more or less by Debbora Presto , NP, on 06 Jun 2018.  She followed up for OSA and CPAP only.  In the meantime on June 13, 2018 Dr. Harrington Challenger initiated a angiography study for the patient who had complained about vertigo and dizziness and has known atherosclerotic disease of the carotid arteries coronary arteries and is also a renal insufficiency patient.  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  Recurrent TIAs and syncopes were also to be evaluated  as well as a history of pulmonary emboli.  The patient presented to Dr. Talbert Forest more for angiography.  The angiogram angiography was performed at: Hospital.  Patient presented with a blood pressure of 554/90 7 mmHg, respiration was 16 a minute, BMI 33.2, review of systems was normal or nonenhanced.  The result report is very long the impression is that of retrograde opacification of the nondominant left vertebrobasilar junction from the right vertebral artery to the level of the left posterior inferior cerebellar artery.  This is an indication of a proximal occlusion of the left vertebral artery.  The right middle cerebral artery on the right anterior cerebral artery showed normal drainage.   The right internal carotid artery had normal flow, the dominant right vertebral artery origin was widely patent.  Please note that the patient had a known vertebrobasilar insufficiency is a left cerebellar infarct which is fully explained by the angiographic findings.  06-18-2018; 4 days later Mr. Kondracki presented to the emergency room at [facility] with chest pain.  He reported left arm pain for about 2 days no true chest pain but radiating into the left arm without diaphoresis or shortness of breath.  His procedure had approached the vascular tree through the right arm.  An EKG was normal referred- 06-18-2018.  Troponin was negative.  He was not short of breath.   In short,  we knew that Mr. Nyzaiah Kai has posterior circulation problems, he knew that he had a cerebellar stroke in the past and a vertebral artery occlusion.  This explains most of his dizziness especially when visible to general the diagnosis of cerebrovascular disease has been made in the year 2010 or 2011 for the first time.  Meclizine is asymptomatic treatment can help to suppress some of the vertigo symptoms or dizziness symptoms it is never a cure.  He also has had physical therapy with Dr. Benjamine Mola, ENT and has not felt that this has benefited his balance.  I also was able to dedicate a little bit of time to his compliance on CPAP, he has been using the machine 24 out of 30 days with 67% compliance until 08 July 2018 I wish he would have a more actual date which could look more compliant, CPAP is set at 13 cm pressure with 3 cm EPR and he has an AHI residual of 1.0/h which speaks for very good resolution of apnea he does have very high air leakage.  I would like for him to consider continuing to use the machine but for over 4 hours each night he has made significant effort since I last talked to him, his Epworth Sleepiness Scale is six-point out of 24 which also is reduced,  The machine is 79 years old and we will retest him and  prescribe an auto-titration device.    Interval history from 31 July 2017 for Mr. Kaci Freel, a 81 year old African-American right-handed gentleman who is presenting with cognitive concerns concerns of vascular origin.  He has been diagnosed with a history of multi-infarct cerebrovascular disease, hypertension, diabetes, has been followed for CPAP compliance, was on chronic anticoagulation but no longer is able to take aspirin. He reports dizziness - lightheaded, not exercise tolerant. Worse when standing up. Antivert was given by PCP.  This has to be seen in relation to changes in RBC and WBC - Dr Waymon Budge follows also his anticoagulants.    With CPAP he had for a while drastically improved his apnea count but he is not using  the CPAP on a nightly basis.  He has now again nocturia and sleepiness, higher fatigue- see below.  CPAP Compliance for the last 30 days was only 30% and average of 1 hours 38 minutes, CPAP was set at 13 cm with 3 cm EPR he has a high air leak but the residual apnea count is only 2.2/h.  I am not sure how I can encourage him to use the machine more often.   MRIs of the brain on 23 July 2017.  These were ordered by Dr. Harrington Challenger, his primary care physician.  MRI brain with and without contrast showed a 4 mm right periventricular occipital focus of reduced diffusion likely a new stroke, older strokes affect the right cerebellar region, the left frontal lobe, patchy pontine and supratentorial white matter intensities.  There is also chronically occluded left vertebral artery.  Known is also that the patient has myelomalacia and his cervical spine MRI was done without contrast = It showed between C3 and the 6 cervical vertebrae and anterior cervical fusion with arthrodesis, he does not have canal stenosis but neuroforaminal narrowing on multiple levels.  This may also explain some arm pain or shoulder pain or even weakness.   His Mini-Mental status examination today was 26 out of 30  points, the Epworth sleepiness score 11 points, fatigue severity was high at 47 out of 63 possible points.  The patient also presented with a very low white blood cell count to his primary care physician WBC was only 3.8, RBC 4.0 hemoglobin 12.9 hematocrit 38 MCV 95 which is high, neutrophils 1.6K per microliter which is low.- has a diagnosis of non-diabetic retinopathy was a working diagnosis.  He is followed by Dr. Geroge Baseman, a podiatrist. He is not driving now.   Interval history from 09/28/2016. Mr. Vandehei had recently seen his primary care physician, Dr. Harle Battiest, who ordered an MRI of the brain, this time the MRI was performed at a different location from his previous study from 10/ 2016 ( have been performed at [facility] and was interpreted by Dr. Lanora Manis). The study was interpreted by Dr. Marinus Maw.  Reading the reports it seems very similar. The new about a bilateral cerebellar hemispheres, left paramedian pons, the new about chronic sinusitis and diffuse white matter disease. Dr. Marinus Maw commented on a small area of encephalomalacia that he found slightly atypical in the left frontal lobe.  I was able to review both MRI side-by-side in the presence of the patient. I do not see a difference. I reviewed T2-weighted images and axial FLAIR.  I have the nature of seeing Mr. Marschall today on 08/03/2016, he has achieved 80% compliance on his CPAP use was 5 hours and 4 minutes of daily use on average, his residual AHI is 1.3. He is using CPAP at a set pressure of 13 cm water with 3 cm EPR. He had become non compliant - and needed a new study for  Supplies and resetting of his CPAP>  He had undergone a new split-night titration study on 03/27/2016 also he had been followed in our sleep clinic since 2013. He has a history of multi-infarct cerebrovascular disease, hypertension, diabetes, and has been followed for CPAP compliance.  His baseline AHI was confirmed at 60.1 per hour AHI, he had prolonged hypoxemia  saturation below 89% was 79 minutes.  He improved drastically with CPAP saturation equaled only 8 minutes, he had no PLMS, his AHI was reduced to 4.5. Sleep efficiency with CPAP was 98%! He still  has nocturia 3-4 times, and he sleeps longer than he uses CPAP. Feels less fatigued.     HPI:  This patient has been established as a sleep patient in our clinic since 2013, has a history of multi-infarct, hypertension, diabetes and was evaluated for neuropathy in 2015, was followed for CPAP compliance until last May when he was no longer compliant due to repeated sinus infections, hospitalizations etc. He stated today that he has not been able to reinitiate CPAP use since March 2017.  His primary care physician, Dr. Harrington Challenger, re- referred now for memory concerns, in a patient with known multi-infarct history but with a sliding result on his minimal mental status examinations. A note from January 5 of this year states that the patient had scored 26 out of 29 points in a high school educated African-American gentleman age 60. The patient had suffered multiple strokes and ongoing presumed TIA events, image studies were quoted below. II suspected that this is a vascular or infarct related memory loss rather than Alzheimer's disease.  It was after review of his notes from Dr Caroline Sauger at [facility] that I noticed that he had just been reevaluated for new stroke at the end of last year including imaging studies. I do not need to repeat any. His cognitive deficits are  explained with a multi-infarct dementia.   UPDATE 05/18/14 Mr. Ludington, who was diagnosed with severe sleep apnea at an AHI of 65.9,  has made  efforts to increase his CPAP compliance.  CPAP was initiated to reduce his secondary CVA risk.    He stated that he had some sinusitis problems that kept him from using CPAP and has just seen Dr. Ernesto Rutherford who prescribed no antibiotics after other measures had failed. He has COPD / Asthma and sees pulmonology. He is  reluctant to use an anti-decongestant which I would also not recommend because it can  spike his blood pressure. He has Flonase which Dr. Harrington Challenger his primary care physician has prescribed for him.  He is now 185 pounds. He also reports that he has a hard time sleeping on his back. Therefore when he sleeps on his sides occasionally the pillow will push the mask off his face causing a leak. He reports that he does try to exercise daily. He has continued on Xarelto and aspirin for stroke prevention. His primary care is managing his hypertension and hyperlipidemia. He denies any additional strokelike symptoms.     HISTORY HISTORY 08/14/13 (CD): Mr. Alberg is meanwhile 81 years old and has been staying active. He has controlled his weight he is physically active, he also has undergone a complete course of physical therapy since last visit. After out last visit, we had obtained an MRI of the brain without contrast and compared to the study from earlier the same year : there was no change in comparison to the May CT on this MRI dated 07-04-13 .  The patient has few scattered small vessel injuries and some lacunar infarcts. The main risk factor for these as hypertension and diabetes- Occupational therapy saw him for left arm pain. Additional risk factor is the patient's diabetes mellitus condition. I referred him for occupational therapy to get further insight in what may have caused his arm pain.   He noted that he could left pounds with the left 3 pounds with her right hand now there is still a significant difference .   resulting in the conduction study and EMG study which was then performed on 07-09-13, normal  study no evidence of large fiber neuropathy in the left upper extremity deltoid, biceps, triceps and flexor carpi radialis as well as interosseus Muscles; no abnormal EMG activity.   He underwent a sleep study at [facility] : results from 08-07-13 reviewed today :   Mr. Harbeson was diagnosed with severe sleep  apnea at an AHI of 65.9. Lowest point of oxygen saturation was 70%. He was titrated to CPAP at 9 cm water he slept 92.7 minutes at that pressure of which 23 minutes of REM sleep. The AHI was now 0.6 per hour. The previously very fragmented sleep architecture became now essentially normal. The technologist used a PIC all equal nasal mask. The patient is asked to return in about 50 days for a followup with the CPAP machine.   I also reviewed his long medication list : patient has been on Xeralto for chronic anticoagulation.  REVIEW OF SYSTEMS: Out of a complete 14 system review of symptoms, the patient complains only of the following symptoms, and all other reviewed systems are negative.  "i cannot recall the Sunday sermon." I am always dizzy- if I stand, I sit , I walk, the TV will move in front of my eyes.     ALLERGIES: Allergies  Allergen Reactions   Other Other (See Comments)    Per patient- cardiac cath dye-  "woke up during procedure hysterical."   Phenergan [Promethazine] Other (See Comments)    Mood changes    Iohexol Other (See Comments)    Patient refuses contrast after having a hysterical event in hospital //r ls spoke with patient    Iohexol    Phenergan [Promethazine]     Other reaction(s): syncope   Tramadol     HOME MEDICATIONS: Outpatient Medications Prior to Visit  Medication Sig Dispense Refill   acetaminophen (TYLENOL) 500 MG tablet Take 500 mg by mouth every 6 (six) hours as needed (pain).     albuterol (VENTOLIN HFA) 108 (90 Base) MCG/ACT inhaler Inhale 1-2 puffs into the lungs every 6 (six) hours as needed for wheezing or shortness of breath. 1 each 0   bisacodyl (DULCOLAX) 10 MG suppository Place 1 suppository (10 mg total) rectally daily as needed for moderate constipation. 12 suppository 0   diclofenac Sodium (VOLTAREN) 1 % GEL Apply 4 g topically 4 (four) times daily. 100 g 0   fluticasone (FLONASE) 50 MCG/ACT nasal spray Place 1 spray into both nostrils daily as  needed for allergies. 16 g 0   furosemide (LASIX) 20 MG tablet Take 1-2 tablets (20-40 mg total) by mouth daily as needed for fluid. 30 tablet 0   hydrochlorothiazide (MICROZIDE) 12.5 MG capsule Take 1 capsule (12.5 mg total) by mouth daily. 30 capsule 0   insulin aspart (NOVOLOG) 100 UNIT/ML FlexPen Inject 5 Units into the skin 3 (three) times daily with meals. 15 mL 0   losartan (COZAAR) 100 MG tablet Take 1 tablet (100 mg total) by mouth daily. 30 tablet 0   modafinil (PROVIGIL) 100 MG tablet Take 1 tablet (100 mg total) by mouth daily. 30 tablet 5   oxyCODONE (ROXICODONE) 5 MG immediate release tablet Take 1 tablet (5 mg total) by mouth every 4 (four) hours as needed for severe pain. 6 tablet 0   pantoprazole (PROTONIX) 40 MG tablet Take 1 tablet (40 mg total) by mouth every morning. 30 tablet 0   polyethylene glycol (MIRALAX / GLYCOLAX) 17 g packet Take 17 g by mouth daily. 14 each 0   pregabalin (  LYRICA) 300 MG capsule Take 300 mg by mouth 2 (two) times daily.     rosuvastatin (CRESTOR) 20 MG tablet Take 1 tablet (20 mg total) by mouth daily. 90 tablet 3   sitaGLIPtin (JANUVIA) 50 MG tablet Take 1 tablet (50 mg total) by mouth daily. 30 tablet 0   TRESIBA FLEXTOUCH 100 UNIT/ML FlexTouch Pen Inject 34 Units into the skin daily. 3 mL 0   XARELTO 20 MG TABS tablet Take 1 tablet (20 mg total) by mouth daily. 30 tablet 0   methocarbamol (ROBAXIN) 500 MG tablet Take 1 tablet (500 mg total) by mouth every 8 (eight) hours as needed for muscle spasms. 30 tablet 0   No facility-administered medications prior to visit.    PAST MEDICAL HISTORY: Past Medical History:  Diagnosis Date   Abnormal prostate biopsy    Anticoagulant long-term use    currently xarelto   BPH with elevated PSA    CKD (chronic kidney disease), stage II    Complication of anesthesia    limted neck rom limited use of left arm due to cva   Coronary artery disease    CARDIOLOGIST-  DR Irish Lack--  2010-- PCI w/ stenting  midLAD   DDD (degenerative disc disease), lumbar    Degeneration of cervical intervertebral disc    Depression    Diabetes mellitus without complication (HCC)    Dyspnea on exertion    GERD (gastroesophageal reflux disease)    Hemiparesis due to cerebral infarction    History of cerebrovascular accident (CVA) with residual deficit 2002 and 2003--  hemiparisis both sides   per MRI  anterior left frontal lobe, left para midline pons, and inferior cerebullam bilaterally infarcts   History of pulmonary embolus (PE)    06-30-2012  extensive bilaterally   History of recurrent TIAs    History of syncope    hx multiple pre-syncope and syncopal episodes due to vasovagal, orthostatic hypotension, dehydration   History of TIAs    several since 2002   Hyperlipidemia    Hypertension    Mild atherosclerosis of carotid artery, bilateral    per last duplex 11-04-2014  bilateral ICA 1--39%   Neuropathy    fingers   OSA on CPAP    followed by dr Lavanna Rog--  sev. osa w/ AHI 65.9   Prostate cancer ([place]) dx 2018   Renal insufficiency    S/P coronary artery stent placement 2010   stenting to mid LAD   Simple renal cyst    bilaterally   Stroke [facility])    Trigger finger of both hands 11-17-13   Type 2 diabetes mellitus ([place]) dx 1986   last one A1c 9.2 on 04-26-2016   Unsteady gait    . Hx prior CVA/TIAs;   Vertebral artery occlusion, left    chronic    PAST SURGICAL HISTORY: Past Surgical History:  Procedure Laterality Date   ANTERIOR CERVICAL DECOMP/DISCECTOMY FUSION  2004   C3 -- C6 limited rom   CARDIAC CATHETERIZATION  06-10-2010   dr Irish Lack   wide patent LAD stent, mid lesion at the origin of the septal prior to the previous stent 40-50%/  normal LVF, ef 55%   CARDIOVASCULAR STRESS TEST  10-23-2012  dr Irish Lack   normal nuclear perfusion study w/ no ischemia/  normal LV function and wall motion , ef 65%   CARPAL TUNNEL RELEASE Bilateral    CATARACT EXTRACTION W/ INTRAOCULAR LENS   IMPLANT, BILATERAL     CHOLECYSTECTOMY N/A 11/02/2015  Procedure: LAPAROSCOPIC CHOLECYSTECTOMY WITH INTRAOPERATIVE CHOLANGIOGRAM;  Surgeon: Donnie Mesa, MD;  Location: [facility];  Service: General;  Laterality: N/A;   COLONOSCOPY     CORONARY ANGIOPLASTY WITH STENT PLACEMENT  02/2008   stenting to mid LAD   GOLD SEED IMPLANT N/A 11/15/2016   Procedure: GOLD SEED IMPLANT [place];  Surgeon: Ardis Hughs, MD;  Location: [facility];  Service: Urology;  Laterality: N/A;   IR ANGIO INTRA EXTRACRAN SEL COM CAROTID INNOMINATE BILAT MOD SED  06/13/2018   IR ANGIO VERTEBRAL SEL VERTEBRAL UNI R MOD SED  06/13/2018   IR US GUIDE VASC ACCESS RIGHT  06/13/2018   LEFT HEART CATH AND CORONARY ANGIOGRAPHY N/A 05/25/2017   Procedure: LEFT HEART CATH AND CORONARY ANGIOGRAPHY;  Surgeon: Jettie Booze, MD;  Location: [facility];  Service: Cardiovascular;  Laterality: N/A;   LEFT HEART CATHETERIZATION WITH CORONARY ANGIOGRAM N/A 04/03/2013   Procedure: LEFT HEART CATHETERIZATION WITH CORONARY ANGIOGRAM;  Surgeon: Jettie Booze, MD;  Location: [facility] CATH LAB;  Service: Cardiovascular;  Laterality: N/A;  patent mLAD stent  w/ mild disease in remainder LAD and its branches;  mod. focal lesion midLCFx- FFR of lesion was negative for ischemia/  normal LVSF, ef 50%   lungs  2005   "fluid pumped off lungs"   NEUROPLASTY / TRANSPOSITION ULNAR NERVE AT ELBOW Right 2004   PROSTATE BIOPSY N/A 08/31/2016   Procedure: PROSTATE  BIOPSY TRANSRECTAL ULTRASONIC PROSTATE (TUBP);  Surgeon: Ardis Hughs, MD;  Location: [facility];  Service: Urology;  Laterality: N/A;   SPACE OAR INSTILLATION N/A 11/15/2016   Procedure: SPACE OAR INSTILLATION;  Surgeon: Ardis Hughs, MD;  Location: [facility];  Service: Urology;  Laterality: N/A;   TRANSTHORACIC ECHOCARDIOGRAM  04/27/2016   severe focal basal LVH, ef 60-65%,  grade 2 diastoilc dysfunction/  mild AR, MR, and  TR/  atrial septum lipomatous hypertrophy/  PASP [id number]   UMBILICAL HERNIA REPAIR      FAMILY HISTORY: Family History  Problem Relation Age of Onset   Aneurysm Mother    Cancer Father        unknown either pancreatic or prostate   Dementia Sister    Stroke Brother    Heart attack Neg Hx    Sleep apnea Neg Hx     SOCIAL HISTORY: Social History   Socioeconomic History   Marital status: Single    Spouse name: Not on file   Number of children: 4   Years of education: 12   Highest education level: 12th grade  Occupational History    Employer: RETIRED    Comment: retired  Tobacco Use   Smoking status: Never   Smokeless tobacco: Never  Vaping Use   Vaping Use: Never used  Substance and Sexual Activity   Alcohol use: Not Currently   Drug use: Never   Sexual activity: Not Currently  Other Topics Concern   Not on file  Social History Narrative   Patient lives at home alone and he is single.     Patient is retired.    Caffeine - one cup daily.   Right handed.   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  Here for follow up after normal angio CT in ED.   PHYSICAL EXAM  Vitals:   05/18/21 1246  BP: 107/61  Pulse: 60  Weight: 204 lb (92.5 kg)  Height: '5\' 5"'$  (1.651 m)   Body mass index is 33.95 kg/m.  Generalized: Well developed, in no acute distress . Seems depressed.   Pale mucous lining.  No ankle edema , no rash.   Reddened eyes.   Neurological examination  Mentation: Alert oriented to time, place, history taking.      08/23/2020   11:29 AM 07/31/2017   11:21 AM  MMSE - Mini Mental State Exam  Orientation to time 5 5  Orientation to Place 5 5   Registration 3 3  Attention/ Calculation 1 3  Recall 3 2  Language- name 2 objects 2 2  Language- repeat 1 1  Language- follow 3 step command 3 3  Language- read & follow direction 1 1  Write a sentence 1 1  Copy design 0 0  Total score 25 26        02/21/2016    2:06 PM  Montreal Cognitive Assessment   Visuospatial/ Executive (0/5) 3  Naming (0/3) 3  Attention: Read list of digits (0/2) 2  Attention: Read list of letters (0/1) 0  Attention: Serial 7 subtraction starting at 100 (0/3) 0  Language: Repeat phrase (0/2) 2  Language : Fluency (0/1) 1  Abstraction (0/2) 2  Delayed Recall (0/5) 5  Orientation (0/6) 5  Total 23  Adjusted Score (based on education) 24    DIAGNOSTIC DATA (LABS, IMAGING, TESTING) - I reviewed patient records, labs, notes, testing and imaging myself where available.   Lab Results  Component Value Date   WBC 5.2 11/26/2020   HGB 13.7 11/26/2020   HCT 40.1 11/26/2020   MCV 93.5 11/26/2020   PLT 182 11/26/2020     General: The patient is awake, alert and appears in no acute distress. The patient is well groomed.he appears very concerned, gives a good medical history of events.  CN; equal pupils- arcus senilis, almost un-reactive to light-  from 3 to 38m. status post blepharoplasty-  Patient has diplopia with gaze to the left lower quadrant.  .  Face is symmetric without facial masking-  no nystagmus noted.    Mallampati is class 3.  Tongue protrudes centrally & palate elevates symmetrically.  Neck:  Supple.ring grossly intact.   Speech is clear , mildly hypophonic with no dysarthria noted.   There is no lip, neck or jaw tremor.   Neck is mildly rigid.  Mental Status:  The patient is awake, alert, oriented . His memory, language & knowledge are appropriate.   Sensory:  Fine touch, pinprick, vibration & temperature sensation were decreased at ankle level.  Motor exam:   Normal tone and normal muscle bulk and symmetric normal strength in all  extremities.  No resting tremor, no postural or action tremor.  Gait : the patient cannot rise from a chair without bracing himself, he is in PT for that. He walks slowly and with smaller steps, turns to either side 180 degrees with 4.5 steps/ small steps, no drift- with some bilateral arm swing. No tremor. Can toe walk and showed negative Romberg- he feels he is moving but stood still.    Cerebellar testing:  No dysmetria or intention tremor on finger-to-nose testing.   Arms were extended fully, clearly left sided  pronator  drift and drift downwards- has shoulder pain-  There is no truncal or gait ataxia. PS: He can master 16 steps at his home. He is reporting no SOB but back pain and shoulder pain on the left restrict/limit his endurance.    ASSESSMENT AND PLAN 81 y.o. 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  Now referred by PCP after chronic dizziness complaint. Which was not addressed in another recent ED visit.  He reports no more blindness but a feeling of movement and lines swimming in front of him, the TV screen seems to move to the left- has diplopia intermittently.   11-22 normal angio CT of head and neck in ED. Periventricular white matter hypodensities consistent with sequela of chronic microvascular ischemic disease. Remote LEFT cerebellar infarction.  Vascular: Vascular calcifications of the carotid siphons and ophthalmic arteries.  05-18-2021: This time no images were obtained.  PT is still going on for sciatica and for core strength. Gait centered PT.   The dizziness is peculiar , more of central origin, can be cerebrovascular and he feels he drifts to the left, his visual field moves to the left. Diplopia is mostly horizontal and some times skewed, always  transient. Can be provoked with gaze to the  left lower quadrant.  Gait is slowed and Romberg was negative-   Orthostatic : lying at 3 min 128 /74 mmHg and 54 regular BPM, standing at 0 min 143/ 70 hr 59 bpm -opposite of expected changes.  standing at 3 min 134/ 74 hr 63 bpm  I believe there are 2 or 3 factors contributing to this picture.  1) His gaze changes can be DM or cerebrovascular related CN damage Abducens paresis. DM plays a role here, too.   2) cerebellar stroke known - long term effects include dizziness, lightheadedness, EOM disturbance.   3) episodic Hypotension. Dysautonomia?  This is most likely given the higher BP when standing up. Dysautonomia work ups are done at tertiary care centers and would involved sweat test and tilt table, I know only that [facility] and [facility] have these labs. Treatment recommendations are still limited, midodrine, same use mestinon. Cardiology and ENT are usually part of the work up. He should not need beta blockers, no tachycardia seen.    Plan : Hydration, Hydration.  Consider further BP medication reduction. Continue PT.  There is not much I can do about the diplopia. He has not had much improvement with meclizine or vestibular Pt.   RV in 6 months with my NP    Larey Seat, MD  05/18/2021, 1:12 PM [facility] [address], [place], [place] [address] [phone]

## 2021-05-20 DIAGNOSIS — M25812 Other specified joint disorders, left shoulder: Secondary | ICD-10-CM | POA: Diagnosis not present

## 2021-05-23 DIAGNOSIS — R270 Ataxia, unspecified: Secondary | ICD-10-CM | POA: Diagnosis not present

## 2021-05-23 DIAGNOSIS — R197 Diarrhea, unspecified: Secondary | ICD-10-CM | POA: Diagnosis not present

## 2021-05-23 DIAGNOSIS — K219 Gastro-esophageal reflux disease without esophagitis: Secondary | ICD-10-CM | POA: Diagnosis not present

## 2021-05-23 DIAGNOSIS — R2681 Unsteadiness on feet: Secondary | ICD-10-CM | POA: Diagnosis not present

## 2021-05-23 DIAGNOSIS — I10 Essential (primary) hypertension: Secondary | ICD-10-CM | POA: Diagnosis not present

## 2021-05-23 DIAGNOSIS — Z6833 Body mass index (BMI) 33.0-33.9, adult: Secondary | ICD-10-CM | POA: Diagnosis not present

## 2021-05-25 DIAGNOSIS — R2681 Unsteadiness on feet: Secondary | ICD-10-CM | POA: Diagnosis not present

## 2021-05-25 DIAGNOSIS — R270 Ataxia, unspecified: Secondary | ICD-10-CM | POA: Diagnosis not present

## 2021-05-30 ENCOUNTER — Emergency Department (HOSPITAL_BASED_OUTPATIENT_CLINIC_OR_DEPARTMENT_OTHER): Payer: Medicare Other

## 2021-05-30 ENCOUNTER — Encounter (HOSPITAL_BASED_OUTPATIENT_CLINIC_OR_DEPARTMENT_OTHER): Payer: Self-pay

## 2021-05-30 ENCOUNTER — Emergency Department (HOSPITAL_BASED_OUTPATIENT_CLINIC_OR_DEPARTMENT_OTHER)
Admission: EM | Admit: 2021-05-30 | Discharge: 2021-05-30 | Disposition: A | Discharge status: Home / Self Care | Payer: Medicare Other | Attending: Emergency Medicine | Admitting: Emergency Medicine

## 2021-05-30 ENCOUNTER — Other Ambulatory Visit: Payer: Self-pay

## 2021-05-30 DIAGNOSIS — Z794 Long term (current) use of insulin: Secondary | ICD-10-CM | POA: Diagnosis not present

## 2021-05-30 DIAGNOSIS — M25512 Pain in left shoulder: Secondary | ICD-10-CM | POA: Insufficient documentation

## 2021-05-30 DIAGNOSIS — Z7901 Long term (current) use of anticoagulants: Secondary | ICD-10-CM | POA: Insufficient documentation

## 2021-05-30 DIAGNOSIS — Z20822 Contact with and (suspected) exposure to covid-19: Secondary | ICD-10-CM | POA: Diagnosis not present

## 2021-05-30 DIAGNOSIS — R42 Dizziness and giddiness: Secondary | ICD-10-CM | POA: Diagnosis not present

## 2021-05-30 DIAGNOSIS — G8929 Other chronic pain: Secondary | ICD-10-CM | POA: Diagnosis not present

## 2021-05-30 DIAGNOSIS — M791 Myalgia, unspecified site: Secondary | ICD-10-CM | POA: Insufficient documentation

## 2021-05-30 DIAGNOSIS — R82998 Other abnormal findings in urine: Secondary | ICD-10-CM | POA: Insufficient documentation

## 2021-05-30 DIAGNOSIS — R079 Chest pain, unspecified: Secondary | ICD-10-CM | POA: Diagnosis not present

## 2021-05-30 DIAGNOSIS — I700 Atherosclerosis of aorta: Secondary | ICD-10-CM | POA: Diagnosis not present

## 2021-05-30 LAB — CBC WITH DIFFERENTIAL/PLATELET
Abs Immature Granulocytes: 0.01 10*3/uL (ref 0.00–0.07)
Basophils Absolute: 0 10*3/uL (ref 0.0–0.1)
Basophils Relative: 1 %
Eosinophils Absolute: 0.1 10*3/uL (ref 0.0–0.5)
Eosinophils Relative: 1 %
HCT: 46.5 % (ref 39.0–52.0)
Hemoglobin: 15.7 g/dL (ref 13.0–17.0)
Immature Granulocytes: 0 %
Lymphocytes Relative: 31 %
Lymphs Abs: 1.6 10*3/uL (ref 0.7–4.0)
MCH: 31 pg (ref 26.0–34.0)
MCHC: 33.8 g/dL (ref 30.0–36.0)
MCV: 91.7 fL (ref 80.0–100.0)
Monocytes Absolute: 0.4 10*3/uL (ref 0.1–1.0)
Monocytes Relative: 8 %
Neutro Abs: 3 10*3/uL (ref 1.7–7.7)
Neutrophils Relative %: 59 %
Platelets: 177 10*3/uL (ref 150–400)
RBC: 5.07 MIL/uL (ref 4.22–5.81)
RDW: 13.7 % (ref 11.5–15.5)
WBC: 5.1 10*3/uL (ref 4.0–10.5)
nRBC: 0 % (ref 0.0–0.2)

## 2021-05-30 LAB — COMPREHENSIVE METABOLIC PANEL
ALT: 73 U/L — ABNORMAL HIGH (ref 0–44)
AST: 49 U/L — ABNORMAL HIGH (ref 15–41)
Albumin: 4 g/dL (ref 3.5–5.0)
Alkaline Phosphatase: 101 U/L (ref 38–126)
Anion gap: 7 (ref 5–15)
BUN: 36 mg/dL — ABNORMAL HIGH (ref 8–23)
CO2: 29 mmol/L (ref 22–32)
Calcium: 9.3 mg/dL (ref 8.9–10.3)
Chloride: 103 mmol/L (ref 98–111)
Creatinine, Ser: 1.6 mg/dL — ABNORMAL HIGH (ref 0.61–1.24)
GFR, Estimated: 44 mL/min — ABNORMAL LOW (ref >60)
Glucose, Bld: 230 mg/dL — ABNORMAL HIGH (ref 70–99)
Potassium: 4.5 mmol/L (ref 3.5–5.1)
Sodium: 139 mmol/L (ref 135–145)
Total Bilirubin: 1 mg/dL (ref 0.3–1.2)
Total Protein: 7.8 g/dL (ref 6.5–8.1)

## 2021-05-30 LAB — URINALYSIS, ROUTINE W REFLEX MICROSCOPIC
Bilirubin Urine: NEGATIVE
Glucose, UA: >=500 mg/dL — AB
Ketones, ur: NEGATIVE mg/dL
Leukocytes,Ua: NEGATIVE
Nitrite: NEGATIVE
Protein, ur: NEGATIVE mg/dL
Specific Gravity, Urine: 1.01 (ref 1.005–1.030)
pH: 5 (ref 5.0–8.0)

## 2021-05-30 LAB — URINALYSIS, MICROSCOPIC (REFLEX): Bacteria, UA: NONE SEEN

## 2021-05-30 LAB — TROPONIN I (HIGH SENSITIVITY): Troponin I (High Sensitivity): 25 ng/L — ABNORMAL HIGH (ref <18)

## 2021-05-30 LAB — CK: Total CK: 116 U/L (ref 49–397)

## 2021-05-30 LAB — RESP PANEL BY RT-PCR (FLU A&B, COVID) ARPGX2
Influenza A by PCR: NEGATIVE
Influenza B by PCR: NEGATIVE
SARS Coronavirus 2 by RT PCR: NEGATIVE

## 2021-05-30 MED ORDER — SODIUM CHLORIDE 0.9 % IV BOLUS
1000.0000 mL | Freq: Once | INTRAVENOUS | Status: AC
  Administered 2021-05-30: 1000 mL via INTRAVENOUS

## 2021-05-30 MED ORDER — DIPHENHYDRAMINE HCL 50 MG/ML IJ SOLN
25.0000 mg | Freq: Once | INTRAMUSCULAR | Status: AC
  Administered 2021-05-30: 25 mg via INTRAVENOUS
  Filled 2021-05-30: qty 1

## 2021-05-30 MED ORDER — PROCHLORPERAZINE EDISYLATE 10 MG/2ML IJ SOLN
10.0000 mg | Freq: Once | INTRAMUSCULAR | Status: AC
Start: 2021-05-30 — End: 2021-05-30
  Administered 2021-05-30: 10 mg via INTRAVENOUS
  Filled 2021-05-30: qty 2

## 2021-05-30 NOTE — Discharge Instructions (Signed)
Please follow-up with your family doctor in the office.  They may need to refer you back to neurology or if they think it may be beneficial to send you to rehab to try and help you with your dizziness.

## 2021-05-30 NOTE — ED Provider Notes (Signed)
Pt was signed out by Dr. Tyrone Nine pending labs.  Pt's labs do not show anything acute.  Covid/flu neg CMP with glucose elevated at 230; bun 36 and Cr 1.6 (chronic); UA neg; CK 116; trop mildly elevated at 25, but pt denies cp.  Pt also denies cough or fever, so doubt pna.  CXR finding is likely atelectasis.    Pt is feeling better after fluids and meds.  He is stable for d/c.  Return if worse.  F/u with pcp.   Isla Pence, MD 05/30/21 [phone]

## 2021-05-30 NOTE — ED Triage Notes (Addendum)
Pt BIB [facility] from home. Pt reports generalized body aches for a couple of weeks and the L shoulder pain became more noticeable. Pt received a cortisone shot 5 days ago and then 3 days ago l shoulder pain got worse. Pt reports some associated ShOB. Pt has a hx of ongoing neck and shoulder musculoskeletal pain.

## 2021-05-30 NOTE — ED Provider Notes (Signed)
[facility] EMERGENCY DEPARTMENT Provider Note   CSN: [id number] Arrival date & time: 05/30/21  1343     History  Chief Complaint  Patient presents with   Generalized Body Aches        Shoulder Pain         Steve Brown is a 81 y.o. male.  81 yo M with a chief complaint of dizziness and body aches.  The patient tells me that he has been having body aches for about a week.  Tells me has had dizziness about the same at a time.  Has had chronic left shoulder pain after a C-spine operation.  He had seen orthopedics and had a cortisone injection but continued to have aches and could not take it anymore and so decided to come here for evaluation.  He denies head trauma.  Denies fevers or chills.  Denies cough congestion denies urinary symptoms denies abdominal pain.   Shoulder Pain     Home Medications Prior to Admission medications   Medication Sig Start Date End Date Taking? Authorizing Provider  acetaminophen (TYLENOL) 500 MG tablet Take 500 mg by mouth every 6 (six) hours as needed (pain).    [provider]  albuterol (VENTOLIN HFA) 108 (90 Base) MCG/ACT inhaler Inhale 1-2 puffs into the lungs every 6 (six) hours as needed for wheezing or shortness of breath. 09/06/20   Ward, Lenise Arena, PA-C  bisacodyl (DULCOLAX) 10 MG suppository Place 1 suppository (10 mg total) rectally daily as needed for moderate constipation. 05/31/20   Antonieta Pert, MD  diclofenac Sodium (VOLTAREN) 1 % GEL Apply 4 g topically 4 (four) times daily. 09/03/19   Deno Etienne, DO  fluticasone (FLONASE) 50 MCG/ACT nasal spray Place 1 spray into both nostrils daily as needed for allergies. 06/13/20   Medina-Vargas, Monina C, NP  furosemide (LASIX) 20 MG tablet Take 1-2 tablets (20-40 mg total) by mouth daily as needed for fluid. 06/13/20   Medina-Vargas, Monina C, NP  hydrochlorothiazide (MICROZIDE) 12.5 MG capsule Take 1 capsule (12.5 mg total) by mouth daily. 06/13/20   Medina-Vargas, Monina C, NP   insulin aspart (NOVOLOG) 100 UNIT/ML FlexPen Inject 5 Units into the skin 3 (three) times daily with meals. 06/13/20   Medina-Vargas, Monina C, NP  losartan (COZAAR) 100 MG tablet Take 1 tablet (100 mg total) by mouth daily. 06/13/20   Medina-Vargas, Monina C, NP  modafinil (PROVIGIL) 100 MG tablet Take 1 tablet (100 mg total) by mouth daily. 08/23/20   Ward Givens, NP  oxyCODONE (ROXICODONE) 5 MG immediate release tablet Take 1 tablet (5 mg total) by mouth every 4 (four) hours as needed for severe pain. 04/20/21   Sherrill Raring, PA-C  pantoprazole (PROTONIX) 40 MG tablet Take 1 tablet (40 mg total) by mouth every morning. 06/13/20   Medina-Vargas, Monina C, NP  polyethylene glycol (MIRALAX / GLYCOLAX) 17 g packet Take 17 g by mouth daily. 05/31/20   Antonieta Pert, MD  pregabalin (LYRICA) 300 MG capsule Take 300 mg by mouth 2 (two) times daily. 04/26/20   [provider]  rosuvastatin (CRESTOR) 20 MG tablet Take 1 tablet (20 mg total) by mouth daily. 11/29/20   Jettie Booze, MD  sitaGLIPtin (JANUVIA) 50 MG tablet Take 1 tablet (50 mg total) by mouth daily. 06/13/20   Medina-Vargas, Monina C, NP  TRESIBA FLEXTOUCH 100 UNIT/ML FlexTouch Pen Inject 34 Units into the skin daily. 06/13/20   Medina-Vargas, Monina C, NP  XARELTO 20 MG TABS tablet Take  1 tablet (20 mg total) by mouth daily. 06/13/20   Medina-Vargas, Monina C, NP      Allergies    Other, Phenergan [promethazine], Iohexol, Iohexol, Phenergan [promethazine], and Tramadol    Review of Systems   Review of Systems  Physical Exam Updated Vital Signs BP (!) 155/73   Pulse (!) 55   Temp 98 F (36.7 C) (Oral)   Resp 17 Comment: Simultaneous filing. User may not have seen previous data.  Ht '5\' 5"'$  (1.651 m)   Wt 87.1 kg   SpO2 99%   BMI 31.95 kg/m  Physical Exam Vitals and nursing note reviewed.  Constitutional:      Appearance: He is well-developed.  HENT:     Head: Normocephalic and atraumatic.  Eyes:     Pupils: Pupils are  equal, round, and reactive to light.  Neck:     Vascular: No JVD.  Cardiovascular:     Rate and Rhythm: Normal rate and regular rhythm.     Heart sounds: No murmur heard.   No friction rub. No gallop.  Pulmonary:     Effort: No respiratory distress.     Breath sounds: No wheezing.  Abdominal:     General: There is no distension.     Tenderness: There is no abdominal tenderness. There is no guarding or rebound.  Musculoskeletal:        General: Normal range of motion.     Cervical back: Normal range of motion and neck supple.  Skin:    Coloration: Skin is not pale.     Findings: No rash.  Neurological:     Mental Status: He is alert and oriented to person, place, and time.     Comments: Left-sided fast-growing nystagmus with far leftward gaze.  Left-sided weakness leg greater than arm which she tells me he is at baseline.  Able to ambulate independently but slow.  Psychiatric:        Behavior: Behavior normal.    ED Results / Procedures / Treatments   Labs (all labs ordered are listed, but only abnormal results are displayed) Labs Reviewed  URINE CULTURE  CULTURE, BLOOD (ROUTINE X 2)  RESP PANEL BY RT-PCR (FLU A&B, COVID) ARPGX2  CBC WITH DIFFERENTIAL/PLATELET  COMPREHENSIVE METABOLIC PANEL  URINALYSIS, ROUTINE W REFLEX MICROSCOPIC  CK  TROPONIN I (HIGH SENSITIVITY)    EKG EKG Interpretation  Date/Time:  Monday May 30 2021 13:50:28 EDT Ventricular Rate:  65 PR Interval:  160 QRS Duration: 84 QT Interval:  372 QTC Calculation: 386 R Axis:   -4 Text Interpretation: Normal sinus rhythm Low voltage QRS Borderline ECG No significant change since last tracing Confirmed by Deno Etienne [phone]) on 05/30/2021 1:51:33 PM  Radiology CT Head Wo Contrast  Result Date: 05/30/2021 CLINICAL DATA:  Dizziness, body aches EXAM: CT HEAD WITHOUT CONTRAST TECHNIQUE: Contiguous axial images were obtained from the base of the skull through the vertex without intravenous contrast. RADIATION  DOSE REDUCTION: This exam was performed according to the departmental dose-optimization program which includes automated exposure control, adjustment of the mA and/or kV according to patient size and/or use of iterative reconstruction technique. COMPARISON:  12/20/2020 FINDINGS: Brain: No evidence of acute infarction, hemorrhage, hydrocephalus, extra-axial collection or mass lesion/mass effect. Mild periventricular white matter hypodensity. Vascular: No hyperdense vessel or unexpected calcification. Skull: Normal. Negative for fracture or focal lesion. Sinuses/Orbits: No acute finding. Chronic opacification of the right sphenoid sinus. Other: None. IMPRESSION: 1. No acute intracranial pathology. Small-vessel white matter disease. 2.  Chronic opacification of the right sphenoid sinus. Electronically Signed   By: Delanna Ahmadi M.D.   On: 05/30/2021 14:40   DG Chest Port 1 View  Result Date: 05/30/2021 CLINICAL DATA:  Chest pain EXAM: PORTABLE CHEST 1 VIEW COMPARISON:  Chest radiograph dated November 03, 2020 FINDINGS: The heart is enlarged. Atherosclerotic calcification of the aortic arch. Low lung volumes with left basilar atelectasis or infiltrate. Partially imaged anterior cervical discectomy fusion and hardware. IMPRESSION: 1. Stable cardiomegaly. 2. Low lung volumes with left basilar atelectasis or infiltrate. Electronically Signed   By: Keane Police D.O.   On: 05/30/2021 14:34    Procedures Procedures    Medications Ordered in ED Medications  sodium chloride 0.9 % bolus 1,000 mL (1,000 mLs Intravenous New Bag/Given 05/30/21 1449)  prochlorperazine (COMPAZINE) injection 10 mg (10 mg Intravenous Given 05/30/21 1450)  diphenhydrAMINE (BENADRYL) injection 25 mg (25 mg Intravenous Given 05/30/21 1450)    ED Course/ Medical Decision Making/ A&P                           Medical Decision Making Amount and/or Complexity of Data Reviewed Labs: ordered. Radiology: ordered.  Risk Prescription drug  management.   81 yo M with a chief complaints of diffuse myalgias and dizziness.  This been going on for about a week.  He tells me that he has been to his doctor was started on meclizine but without improvement..  On my record review it appears that the patient has had dizziness for some time and has had multiple visits to his doctor as well as neurology and has a known cerebellar infarct.  We will obtain a CT scan to assess for change.  With myalgias we will send off cultures for possible bacteremia.  He is afebrile here.  Has no other specific signs or symptoms consistent of a source.  Chest x-ray independently interpreted by me without focal infiltrate.  Radiology concern for left lower lobe atelectasis versus infiltrate.  Again verified with patient is no coughing no specific pain to that region.  I doubt pneumonia we will hold off on antibiotics.  CT scan of the head without acute intracranial pathology.  Awaiting blood work.  Signed out to Dr. Gilford Raid, please see their note for further details of care in the ED.  The patients results and plan were reviewed and discussed.   Any x-rays performed were independently reviewed by myself.   Differential diagnosis were considered with the presenting HPI.  Medications  sodium chloride 0.9 % bolus 1,000 mL (1,000 mLs Intravenous New Bag/Given 05/30/21 1449)  prochlorperazine (COMPAZINE) injection 10 mg (10 mg Intravenous Given 05/30/21 1450)  diphenhydrAMINE (BENADRYL) injection 25 mg (25 mg Intravenous Given 05/30/21 1450)    Vitals:   05/30/21 1348 05/30/21 1353 05/30/21 1502  BP:  119/75 (!) 155/73  Pulse:  66 (!) 55  Resp:  16 17  Temp:  98 F (36.7 C)   TempSrc:  Oral   SpO2:  96% 99%  Weight: 87.1 kg    Height: '5\' 5"'$  (1.651 m)      Final diagnoses:  Myalgia  Dizziness  Chronic left shoulder pain            Final Clinical Impression(s) / ED Diagnoses Final diagnoses:  Myalgia  Dizziness  Chronic left shoulder  pain    Rx / DC Orders ED Discharge Orders     None  Deno Etienne, DO 05/30/21 1515

## 2021-05-30 NOTE — ED Notes (Signed)
Patient stuck x2 in attempts to obtain second blood culture.

## 2021-05-31 LAB — URINE CULTURE: Culture: <10000 — AB

## 2021-06-04 LAB — CULTURE, BLOOD (ROUTINE X 2): Culture: NO GROWTH

## 2021-06-07 ENCOUNTER — Institutional Professional Consult (permissible substitution): Payer: 59 | Admitting: Adult Health

## 2021-06-09 ENCOUNTER — Other Ambulatory Visit: Payer: Self-pay | Admitting: Family Medicine

## 2021-06-09 DIAGNOSIS — I2510 Atherosclerotic heart disease of native coronary artery without angina pectoris: Secondary | ICD-10-CM

## 2021-06-09 DIAGNOSIS — R42 Dizziness and giddiness: Secondary | ICD-10-CM

## 2021-06-09 DIAGNOSIS — I69359 Hemiplegia and hemiparesis following cerebral infarction affecting unspecified side: Secondary | ICD-10-CM

## 2021-06-10 ENCOUNTER — Ambulatory Visit (HOSPITAL_BASED_OUTPATIENT_CLINIC_OR_DEPARTMENT_OTHER)
Admission: RE | Admit: 2021-06-10 | Discharge: 2021-06-10 | Disposition: A | Discharge status: Home / Self Care | Payer: Medicare Other | Source: Ambulatory Visit | Attending: Family Medicine | Admitting: Family Medicine

## 2021-06-10 DIAGNOSIS — I2510 Atherosclerotic heart disease of native coronary artery without angina pectoris: Secondary | ICD-10-CM

## 2021-06-10 DIAGNOSIS — R42 Dizziness and giddiness: Secondary | ICD-10-CM

## 2021-06-10 DIAGNOSIS — I69359 Hemiplegia and hemiparesis following cerebral infarction affecting unspecified side: Secondary | ICD-10-CM

## 2021-06-16 DIAGNOSIS — R270 Ataxia, unspecified: Secondary | ICD-10-CM | POA: Diagnosis not present

## 2021-06-16 DIAGNOSIS — R2681 Unsteadiness on feet: Secondary | ICD-10-CM | POA: Diagnosis not present

## 2021-06-17 ENCOUNTER — Ambulatory Visit (HOSPITAL_BASED_OUTPATIENT_CLINIC_OR_DEPARTMENT_OTHER)
Admission: RE | Admit: 2021-06-17 | Discharge: 2021-06-17 | Disposition: A | Discharge status: Home / Self Care | Payer: Medicare Other | Source: Ambulatory Visit | Attending: Family Medicine | Admitting: Family Medicine

## 2021-06-17 DIAGNOSIS — R42 Dizziness and giddiness: Secondary | ICD-10-CM | POA: Diagnosis not present

## 2021-06-17 DIAGNOSIS — I69359 Hemiplegia and hemiparesis following cerebral infarction affecting unspecified side: Secondary | ICD-10-CM | POA: Diagnosis not present

## 2021-06-17 DIAGNOSIS — I2510 Atherosclerotic heart disease of native coronary artery without angina pectoris: Secondary | ICD-10-CM | POA: Insufficient documentation

## 2021-06-17 MED ORDER — GADOBUTROL 1 MMOL/ML IV SOLN
8.7000 mL | Freq: Once | INTRAVENOUS | Status: AC | PRN
  Administered 2021-06-17: 8.7 mL via INTRAVENOUS
  Filled 2021-06-17: qty 10

## 2021-06-20 DIAGNOSIS — R270 Ataxia, unspecified: Secondary | ICD-10-CM | POA: Diagnosis not present

## 2021-06-20 DIAGNOSIS — R2681 Unsteadiness on feet: Secondary | ICD-10-CM | POA: Diagnosis not present

## 2021-06-22 DIAGNOSIS — R2681 Unsteadiness on feet: Secondary | ICD-10-CM | POA: Diagnosis not present

## 2021-06-22 DIAGNOSIS — R270 Ataxia, unspecified: Secondary | ICD-10-CM | POA: Diagnosis not present

## 2021-06-23 ENCOUNTER — Other Ambulatory Visit: Payer: 59

## 2021-06-23 DIAGNOSIS — B379 Candidiasis, unspecified: Secondary | ICD-10-CM | POA: Diagnosis not present

## 2021-06-23 DIAGNOSIS — E782 Mixed hyperlipidemia: Secondary | ICD-10-CM | POA: Diagnosis not present

## 2021-06-23 DIAGNOSIS — Z8673 Personal history of transient ischemic attack (TIA), and cerebral infarction without residual deficits: Secondary | ICD-10-CM | POA: Diagnosis not present

## 2021-06-23 DIAGNOSIS — N183 Chronic kidney disease, stage 3 unspecified: Secondary | ICD-10-CM | POA: Diagnosis not present

## 2021-06-23 DIAGNOSIS — R3989 Other symptoms and signs involving the genitourinary system: Secondary | ICD-10-CM | POA: Diagnosis not present

## 2021-06-27 ENCOUNTER — Ambulatory Visit: Payer: 59 | Admitting: Adult Health

## 2021-06-27 DIAGNOSIS — R270 Ataxia, unspecified: Secondary | ICD-10-CM | POA: Diagnosis not present

## 2021-06-27 DIAGNOSIS — R2681 Unsteadiness on feet: Secondary | ICD-10-CM | POA: Diagnosis not present

## 2021-06-28 ENCOUNTER — Other Ambulatory Visit: Payer: Self-pay | Admitting: Adult Health

## 2021-06-29 ENCOUNTER — Telehealth: Payer: Self-pay | Admitting: *Deleted

## 2021-06-29 DIAGNOSIS — R270 Ataxia, unspecified: Secondary | ICD-10-CM | POA: Diagnosis not present

## 2021-06-29 DIAGNOSIS — R2681 Unsteadiness on feet: Secondary | ICD-10-CM | POA: Diagnosis not present

## 2021-06-29 NOTE — Telephone Encounter (Signed)
Received fax from [facility] that [place] approved 06/29/21-06/28/21. PA#[employer] [id number].

## 2021-06-29 NOTE — Telephone Encounter (Signed)
Submitted PA modafinil on CMM. ILN:[id number]. Waiting on determination from [facility].

## 2021-07-04 DIAGNOSIS — R270 Ataxia, unspecified: Secondary | ICD-10-CM | POA: Diagnosis not present

## 2021-07-04 DIAGNOSIS — R2681 Unsteadiness on feet: Secondary | ICD-10-CM | POA: Diagnosis not present

## 2021-07-05 DIAGNOSIS — M25812 Other specified joint disorders, left shoulder: Secondary | ICD-10-CM | POA: Diagnosis not present

## 2021-07-06 ENCOUNTER — Telehealth: Payer: Self-pay | Admitting: Interventional Cardiology

## 2021-07-06 DIAGNOSIS — R2681 Unsteadiness on feet: Secondary | ICD-10-CM | POA: Diagnosis not present

## 2021-07-06 DIAGNOSIS — R270 Ataxia, unspecified: Secondary | ICD-10-CM | POA: Diagnosis not present

## 2021-07-06 NOTE — Telephone Encounter (Signed)
Pt returning nurses call. Call transferred 

## 2021-07-06 NOTE — Telephone Encounter (Signed)
I placed call to patient but was unable to leave message as mailbox is full 

## 2021-07-06 NOTE — Telephone Encounter (Signed)
Left message to call office

## 2021-07-06 NOTE — Telephone Encounter (Signed)
Pt called asking about a BP program that is PCP recommenced. He stated its a program where they take is BP on a monitor and if it gets to high or low they'll call. Please advise.

## 2021-07-06 NOTE — Telephone Encounter (Signed)
I spoke with patient. He was in a program where his BP readings were sent in automatically every day.  He was then called if readings were abnormal.  He is not sure who ran this program but the program has ended.  He is asking if our office participates in this program.  I told patient we did not have a program where BP readings were automatically sent to office.  I told him he could occasionally send BP readings in for Dr Irish Lack to review if he would like

## 2021-07-08 DIAGNOSIS — M5116 Intervertebral disc disorders with radiculopathy, lumbar region: Secondary | ICD-10-CM | POA: Diagnosis not present

## 2021-07-08 DIAGNOSIS — M545 Low back pain, unspecified: Secondary | ICD-10-CM | POA: Diagnosis not present

## 2021-07-11 DIAGNOSIS — R2681 Unsteadiness on feet: Secondary | ICD-10-CM | POA: Diagnosis not present

## 2021-07-11 DIAGNOSIS — R270 Ataxia, unspecified: Secondary | ICD-10-CM | POA: Diagnosis not present

## 2021-07-13 DIAGNOSIS — E1142 Type 2 diabetes mellitus with diabetic polyneuropathy: Secondary | ICD-10-CM | POA: Diagnosis not present

## 2021-07-13 DIAGNOSIS — R270 Ataxia, unspecified: Secondary | ICD-10-CM | POA: Diagnosis not present

## 2021-07-13 DIAGNOSIS — R2681 Unsteadiness on feet: Secondary | ICD-10-CM | POA: Diagnosis not present

## 2021-07-16 LAB — GLUCOSE, POCT (MANUAL RESULT ENTRY): POC Glucose: 351 mg/dl — AB (ref 70–99)

## 2021-07-17 NOTE — Progress Notes (Unsigned)
Cardiology Office Note   Date:  07/17/2021   ID:  Steve Brown, DOB 22-May-1940, MRN [id number]  PCP:  Steve Cruel, MD    No chief complaint on file.  CAD  Wt Readings from Last 3 Encounters:  05/30/21 192 lb (87.1 kg)  05/18/21 204 lb (92.5 kg)  04/20/21 205 lb (93 kg)       History of Present Illness: Steve Brown is a 81 y.o. male  With a h/o CAD. He has an LAD DES and moderate circumflex disease.  Per the record: He has a "past medical history significant for CAD s/p LAD stent 2010, bilateral PE 07/2012 on Xarelto, diabetes type 2, prostate cancer diagnosed in 2018- treated with radiation, OSA on CPAP, hypertension, hyperlipidemia, history of TIAs and stroke, CKD stage II.  Steve Brown had a stent to the LAD in 2010, 2012 cath showed patent stent and no significant CAD, cardiac cath in 2015 showed moderate disease which was negative by FFR. His most recent echocardiogram in 04/2016 showed normal LV systolic function with EF 60-65 percent, normal wall motion, grade 2 diastolic dysfunction, mild AR, mildly dilated aortic root 39 mm, mild MR and increased thickness of the septum consistent with lipomatous hypertrophy."     Last cath in 2019 showed: "Acute Mrg lesion is 25% stenosed. Mid RCA lesion is 25% stenosed. 2nd Mrg lesion is 40% stenosed. Mid Cx lesion is 40% stenosed. Previously placed Mid LAD stent (unknown type) is widely patent. Prox LAD-1 lesion is 25% stenosed. Prox LAD-2 lesion is 25% stenosed. The left ventricular systolic function is normal. LV end diastolic pressure is normal. The left ventricular ejection fraction is 55-65% by visual estimate. There is no aortic valve stenosis.   Patent stent.  Nonobstructive disease.  Would investigate noncardiac causes of fatigue."     He had a TIA affecting the left side of his body in 9/19.  MRI was OK.    In Jan 2020,  He reported: " Dizziness: He has seen ENT before, but he did not want to consider  surgery.  I discussed sending him to [facility] for rehab options. He is agreeable. "   Had some volume overload.  ECho in 7/21 showed: "Left ventricular ejection fraction, by estimation,  is 60 to 65%. The left ventricle has normal function. The left ventricle  has no regional wall motion abnormalities. Left ventricular diastolic  parameters were normal.   2. Right ventricular systolic function is normal. The right ventricular  size is normal. There is mildly elevated pulmonary artery systolic  pressure.   3. The mitral valve is normal in structure. Trivial mitral valve  regurgitation. No evidence of mitral stenosis.   4. The aortic valve is tricuspid. Aortic valve regurgitation is trivial.  Mild aortic valve sclerosis is present, with no evidence of aortic valve  stenosis.   5. Mild pulmonic stenosis.   6. The inferior vena cava is normal in size with greater than 50%  respiratory variability, suggesting right atrial pressure of 3 mmHg. "   Seen in ER 8/21 for fall:"chief complaint of a fall.  Patient states that his right knee has been giving him trouble and has been causing him severe pain at times and caused him to fall to the ground once or twice.  Patient had an episode like this last night and collapsed to the ground and struck the back of his head.  Complaining of pain to the low back and not really of pain  to the right knee.  Pain to the left shoulder.  He called his family doctor who suggested he come here for evaluation.  He is on Xarelto."  Had negative head a spine CT.   Back pain has limited exercise.    Had visual problems in 11/2020.  He wonders about ocular strokes.  He was losing vision while watching TV.  Vision returns after 15 minutes, but then becomes double.  He has had some nausea and vomiting.    Doing PT for back and sciatica.         Past Medical History:  Diagnosis Date   Abnormal prostate biopsy    Anticoagulant long-term use    currently xarelto   BPH  with elevated PSA    CKD (chronic kidney disease), stage II    Complication of anesthesia    limted neck rom limited use of left arm due to cva   Coronary artery disease    CARDIOLOGIST-  Steve Brown--  2010-- PCI w/ stenting midLAD   DDD (degenerative disc disease), lumbar    Degeneration of cervical intervertebral disc    Depression    Diabetes mellitus without complication (HCC)    Dyspnea on exertion    GERD (gastroesophageal reflux disease)    Hemiparesis due to cerebral infarction    History of cerebrovascular accident (CVA) with residual deficit 2002 and 2003--  hemiparisis both sides   per MRI  anterior left frontal lobe, left para midline pons, and inferior cerebullam bilaterally infarcts   History of pulmonary embolus (PE)    06-30-2012  extensive bilaterally   History of recurrent TIAs    History of syncope    hx multiple pre-syncope and syncopal episodes due to vasovagal, orthostatic hypotension, dehydration   History of TIAs    several since 2002   Hyperlipidemia    Hypertension    Mild atherosclerosis of carotid artery, bilateral    per last duplex 11-04-2014  bilateral ICA 1--39%   Neuropathy    fingers   OSA on CPAP    followed by Steve Steve Brown--  sev. osa w/ AHI 65.9   Prostate cancer ([place]) dx 2018   Renal insufficiency    S/P coronary artery stent placement 2010   stenting to mid LAD   Simple renal cyst    bilaterally   Stroke [facility])    Trigger finger of both hands 11-17-13   Type 2 diabetes mellitus ([place]) dx 1986   last one A1c 9.2 on 04-26-2016   Unsteady gait    . Hx prior CVA/TIAs;   Vertebral artery occlusion, left    chronic    Past Surgical History:  Procedure Laterality Date   ANTERIOR CERVICAL DECOMP/DISCECTOMY FUSION  2004   C3 -- C6 limited rom   CARDIAC CATHETERIZATION  06-10-2010   Steve Brown   wide patent LAD stent, mid lesion at the origin of the septal prior to the previous stent 40-50%/  normal LVF, ef 55%   CARDIOVASCULAR STRESS  TEST  10-23-2012  Steve Brown   normal nuclear perfusion study w/ no ischemia/  normal LV function and wall motion , ef 65%   CARPAL TUNNEL RELEASE Bilateral    CATARACT EXTRACTION W/ INTRAOCULAR LENS  IMPLANT, BILATERAL     CHOLECYSTECTOMY N/A 11/02/2015   Procedure: LAPAROSCOPIC CHOLECYSTECTOMY WITH INTRAOPERATIVE CHOLANGIOGRAM;  Surgeon: Donnie Mesa, MD;  Location: [place];  Service: General;  Laterality: N/A;   COLONOSCOPY     CORONARY ANGIOPLASTY WITH STENT PLACEMENT  02/2008  stenting to mid LAD   GOLD SEED IMPLANT N/A 11/15/2016   Procedure: GOLD SEED IMPLANT TIMES THREE;  Surgeon: Ardis Hughs, MD;  Location: [facility];  Service: Urology;  Laterality: N/A;   IR ANGIO INTRA EXTRACRAN SEL COM CAROTID INNOMINATE BILAT MOD SED  06/13/2018   IR ANGIO VERTEBRAL SEL VERTEBRAL UNI R MOD SED  06/13/2018   IR US GUIDE VASC ACCESS RIGHT  06/13/2018   LEFT HEART CATH AND CORONARY ANGIOGRAPHY N/A 05/25/2017   Procedure: LEFT HEART CATH AND CORONARY ANGIOGRAPHY;  Surgeon: Jettie Booze, MD;  Location: [place] CV LAB;  Service: Cardiovascular;  Laterality: N/A;   LEFT HEART CATHETERIZATION WITH CORONARY ANGIOGRAM N/A 04/03/2013   Procedure: LEFT HEART CATHETERIZATION WITH CORONARY ANGIOGRAM;  Surgeon: Jettie Booze, MD;  Location: [facility] CATH LAB;  Service: Cardiovascular;  Laterality: N/A;  patent mLAD stent  w/ mild disease in remainder LAD and its branches;  mod. focal lesion midLCFx- FFR of lesion was negative for ischemia/  normal LVSF, ef 50%   lungs  2005   "fluid pumped off lungs"   NEUROPLASTY / TRANSPOSITION ULNAR NERVE AT ELBOW Right 2004   PROSTATE BIOPSY N/A 08/31/2016   Procedure: PROSTATE  BIOPSY TRANSRECTAL ULTRASONIC PROSTATE (TUBP);  Surgeon: Ardis Hughs, MD;  Location: [facility];  Service: Urology;  Laterality: N/A;   SPACE OAR INSTILLATION N/A 11/15/2016   Procedure: SPACE OAR INSTILLATION;  Surgeon: Ardis Hughs, MD;   Location: [facility];  Service: Urology;  Laterality: N/A;   TRANSTHORACIC ECHOCARDIOGRAM  04/27/2016   severe focal basal LVH, ef 60-65%,  grade 2 diastoilc dysfunction/  mild AR, MR, and TR/  atrial septum lipomatous hypertrophy/  PASP 24MPNT   UMBILICAL HERNIA REPAIR       Current Outpatient Medications  Medication Sig Dispense Refill   acetaminophen (TYLENOL) 500 MG tablet Take 500 mg by mouth every 6 (six) hours as needed (pain).     albuterol (VENTOLIN HFA) 108 (90 Base) MCG/ACT inhaler Inhale 1-2 puffs into the lungs every 6 (six) hours as needed for wheezing or shortness of breath. 1 each 0   bisacodyl (DULCOLAX) 10 MG suppository Place 1 suppository (10 mg total) rectally daily as needed for moderate constipation. 12 suppository 0   diclofenac Sodium (VOLTAREN) 1 % GEL Apply 4 g topically 4 (four) times daily. 100 g 0   fluticasone (FLONASE) 50 MCG/ACT nasal spray Place 1 spray into both nostrils daily as needed for allergies. 16 g 0   furosemide (LASIX) 20 MG tablet Take 1-2 tablets (20-40 mg total) by mouth daily as needed for fluid. 30 tablet 0   hydrochlorothiazide (MICROZIDE) 12.5 MG capsule Take 1 capsule (12.5 mg total) by mouth daily. 30 capsule 0   insulin aspart (NOVOLOG) 100 UNIT/ML FlexPen Inject 5 Units into the skin 3 (three) times daily with meals. 15 mL 0   losartan (COZAAR) 100 MG tablet Take 1 tablet (100 mg total) by mouth daily. 30 tablet 0   modafinil (PROVIGIL) 100 MG tablet TAKE 1 TABLET BY MOUTH EVERY DAY 30 tablet 5   oxyCODONE (ROXICODONE) 5 MG immediate release tablet Take 1 tablet (5 mg total) by mouth every 4 (four) hours as needed for severe pain. 6 tablet 0   pantoprazole (PROTONIX) 40 MG tablet Take 1 tablet (40 mg total) by mouth every morning. 30 tablet 0   polyethylene glycol (MIRALAX / GLYCOLAX) 17 g packet Take 17 g by mouth  daily. 14 each 0   pregabalin (LYRICA) 300 MG capsule Take 300 mg by mouth 2 (two) times daily.      rosuvastatin (CRESTOR) 20 MG tablet Take 1 tablet (20 mg total) by mouth daily. 90 tablet 3   sitaGLIPtin (JANUVIA) 50 MG tablet Take 1 tablet (50 mg total) by mouth daily. 30 tablet 0   TRESIBA FLEXTOUCH 100 UNIT/ML FlexTouch Pen Inject 34 Units into the skin daily. 3 mL 0   XARELTO 20 MG TABS tablet Take 1 tablet (20 mg total) by mouth daily. 30 tablet 0   No current facility-administered medications for this visit.    Allergies:   Other, Phenergan [promethazine], Iohexol, Iohexol, Phenergan [promethazine], and Tramadol    Social History:  The patient  reports that he has never smoked. He has never used smokeless tobacco. He reports that he does not currently use alcohol. He reports that he does not use drugs.   Family History:  The patient's ***family history includes Aneurysm in his mother; Cancer in his father; Dementia in his sister; Stroke in his brother.    ROS:  Please see the history of present illness.   Otherwise, review of systems are positive for ***.   All other systems are reviewed and negative.    PHYSICAL EXAM: VS:  There were no vitals taken for this visit. , BMI There is no height or weight on file to calculate BMI. GEN: Well nourished, well developed, in no acute distress HEENT: normal Neck: no JVD, carotid bruits, or masses Cardiac: ***RRR; no murmurs, rubs, or gallops,no edema  Respiratory:  clear to auscultation bilaterally, normal work of breathing GI: soft, nontender, nondistended, + BS MS: no deformity or atrophy Skin: warm and dry, no rash Neuro:  Strength and sensation are intact Psych: euthymic mood, full affect   EKG:   The ekg ordered today demonstrates ***   Recent Labs: 05/30/2021: ALT 73; BUN 36; Creatinine, Ser 1.60; Hemoglobin 15.7; Platelets 177; Potassium 4.5; Sodium 139   Lipid Panel    Component Value Date/Time   CHOL 139 11/24/2019 0944   TRIG 70 11/24/2019 0944   HDL 53 11/24/2019 0944   CHOLHDL 2.6 11/24/2019 0944   CHOLHDL 4.6  08/12/2019 0424   VLDL 21 08/12/2019 0424   LDLCALC 72 11/24/2019 0944     Other studies Reviewed: Additional studies/ records that were reviewed today with results demonstrating: ***.   ASSESSMENT AND PLAN:  CAD: Has had some chronic DOE.  DM2: Anticoagulated: OSA: HTN: Leg swelling:   Current medicines are reviewed at length with the patient today.  The patient concerns regarding his medicines were addressed.  The following changes have been made:  No change***  Labs/ tests ordered today include: *** No orders of the defined types were placed in this encounter.   Recommend 150 minutes/week of aerobic exercise Low fat, low carb, high fiber diet recommended  Disposition:   FU in ***   Signed, Larae Grooms, MD  07/17/2021 10:45 PM    [facility] [address], [place], [place]  [address] Phone: [phone]; Fax: [phone]

## 2021-07-18 ENCOUNTER — Encounter: Payer: Self-pay | Admitting: Interventional Cardiology

## 2021-07-18 ENCOUNTER — Ambulatory Visit (INDEPENDENT_AMBULATORY_CARE_PROVIDER_SITE_OTHER): Payer: Medicare Other | Admitting: Interventional Cardiology

## 2021-07-18 VITALS — BP 100/58 | HR 68 | Ht 65.0 in | Wt 188.0 lb

## 2021-07-18 DIAGNOSIS — G4733 Obstructive sleep apnea (adult) (pediatric): Secondary | ICD-10-CM | POA: Diagnosis not present

## 2021-07-18 DIAGNOSIS — I10 Essential (primary) hypertension: Secondary | ICD-10-CM

## 2021-07-18 DIAGNOSIS — Z7901 Long term (current) use of anticoagulants: Secondary | ICD-10-CM | POA: Diagnosis not present

## 2021-07-18 DIAGNOSIS — E1159 Type 2 diabetes mellitus with other circulatory complications: Secondary | ICD-10-CM

## 2021-07-18 DIAGNOSIS — M7989 Other specified soft tissue disorders: Secondary | ICD-10-CM

## 2021-07-18 DIAGNOSIS — I2510 Atherosclerotic heart disease of native coronary artery without angina pectoris: Secondary | ICD-10-CM

## 2021-07-18 DIAGNOSIS — I25118 Atherosclerotic heart disease of native coronary artery with other forms of angina pectoris: Secondary | ICD-10-CM | POA: Diagnosis not present

## 2021-07-18 NOTE — Patient Instructions (Signed)
Medication Instructions:  ?Your physician recommends that you continue on your current medications as directed. Please refer to the Current Medication list given to you today. ? ?*If you need a refill on your cardiac medications before your next appointment, please call your pharmacy* ? ? ?Lab Work: ?none ?If you have labs (blood work) drawn today and your tests are completely normal, you will receive your results only by: ?MyChart Message (if you have MyChart) OR ?A paper copy in the mail ?If you have any lab test that is abnormal or we need to change your treatment, we will call you to review the results. ? ? ?Testing/Procedures: ?none ? ? ?Follow-Up: ?At [facility], you and your health needs are our priority.  As part of our continuing mission to provide you with exceptional heart care, we have created designated Provider Care Teams.  These Care Teams include your primary Cardiologist (physician) and Advanced Practice Providers (APPs -  Physician Assistants and Nurse Practitioners) who all work together to provide you with the care you need, when you need it. ? ?We recommend signing up for the patient portal called "MyChart".  Sign up information is provided on this After Visit Summary.  MyChart is used to connect with patients for Virtual Visits (Telemedicine).  Patients are able to view lab/test results, encounter notes, upcoming appointments, etc.  Non-urgent messages can be sent to your provider as well.   ?To learn more about what you can do with MyChart, go to [email or web address].   ? ?Your next appointment:   ?12 month(s) ? ?The format for your next appointment:   ?In Person ? ?Provider:   ?Jayadeep Varanasi, MD   ? ? ?Other Instructions ?  ? ?Important Information About Sugar ? ? ? ? ? ? ?

## 2021-07-19 DIAGNOSIS — G609 Hereditary and idiopathic neuropathy, unspecified: Secondary | ICD-10-CM | POA: Diagnosis not present

## 2021-07-19 DIAGNOSIS — E1165 Type 2 diabetes mellitus with hyperglycemia: Secondary | ICD-10-CM | POA: Diagnosis not present

## 2021-07-19 DIAGNOSIS — I10 Essential (primary) hypertension: Secondary | ICD-10-CM | POA: Diagnosis not present

## 2021-07-20 DIAGNOSIS — R2681 Unsteadiness on feet: Secondary | ICD-10-CM | POA: Diagnosis not present

## 2021-07-20 DIAGNOSIS — R270 Ataxia, unspecified: Secondary | ICD-10-CM | POA: Diagnosis not present

## 2021-07-25 DIAGNOSIS — M5416 Radiculopathy, lumbar region: Secondary | ICD-10-CM | POA: Diagnosis not present

## 2021-07-26 DIAGNOSIS — R2681 Unsteadiness on feet: Secondary | ICD-10-CM | POA: Diagnosis not present

## 2021-07-26 DIAGNOSIS — R270 Ataxia, unspecified: Secondary | ICD-10-CM | POA: Diagnosis not present

## 2021-07-28 DIAGNOSIS — M71572 Other bursitis, not elsewhere classified, left ankle and foot: Secondary | ICD-10-CM | POA: Diagnosis not present

## 2021-07-28 DIAGNOSIS — R2681 Unsteadiness on feet: Secondary | ICD-10-CM | POA: Diagnosis not present

## 2021-07-28 DIAGNOSIS — R270 Ataxia, unspecified: Secondary | ICD-10-CM | POA: Diagnosis not present

## 2021-07-28 DIAGNOSIS — B353 Tinea pedis: Secondary | ICD-10-CM | POA: Diagnosis not present

## 2021-07-28 DIAGNOSIS — M2042 Other hammer toe(s) (acquired), left foot: Secondary | ICD-10-CM | POA: Diagnosis not present

## 2021-08-01 DIAGNOSIS — R2681 Unsteadiness on feet: Secondary | ICD-10-CM | POA: Diagnosis not present

## 2021-08-01 DIAGNOSIS — R270 Ataxia, unspecified: Secondary | ICD-10-CM | POA: Diagnosis not present

## 2021-08-03 DIAGNOSIS — D649 Anemia, unspecified: Secondary | ICD-10-CM | POA: Diagnosis not present

## 2021-08-03 DIAGNOSIS — E559 Vitamin D deficiency, unspecified: Secondary | ICD-10-CM | POA: Diagnosis not present

## 2021-08-08 DIAGNOSIS — N183 Chronic kidney disease, stage 3 unspecified: Secondary | ICD-10-CM | POA: Diagnosis not present

## 2021-08-08 DIAGNOSIS — Z86711 Personal history of pulmonary embolism: Secondary | ICD-10-CM | POA: Diagnosis not present

## 2021-08-08 DIAGNOSIS — Z7901 Long term (current) use of anticoagulants: Secondary | ICD-10-CM | POA: Diagnosis not present

## 2021-08-08 DIAGNOSIS — Z0000 Encounter for general adult medical examination without abnormal findings: Secondary | ICD-10-CM | POA: Diagnosis not present

## 2021-08-08 DIAGNOSIS — I10 Essential (primary) hypertension: Secondary | ICD-10-CM | POA: Diagnosis not present

## 2021-08-08 DIAGNOSIS — Z6832 Body mass index (BMI) 32.0-32.9, adult: Secondary | ICD-10-CM | POA: Diagnosis not present

## 2021-08-08 DIAGNOSIS — J309 Allergic rhinitis, unspecified: Secondary | ICD-10-CM | POA: Diagnosis not present

## 2021-08-08 DIAGNOSIS — I69354 Hemiplegia and hemiparesis following cerebral infarction affecting left non-dominant side: Secondary | ICD-10-CM | POA: Diagnosis not present

## 2021-08-08 DIAGNOSIS — K219 Gastro-esophageal reflux disease without esophagitis: Secondary | ICD-10-CM | POA: Diagnosis not present

## 2021-08-08 DIAGNOSIS — A6000 Herpesviral infection of urogenital system, unspecified: Secondary | ICD-10-CM | POA: Diagnosis not present

## 2021-08-09 DIAGNOSIS — R2681 Unsteadiness on feet: Secondary | ICD-10-CM | POA: Diagnosis not present

## 2021-08-09 DIAGNOSIS — R270 Ataxia, unspecified: Secondary | ICD-10-CM | POA: Diagnosis not present

## 2021-08-11 DIAGNOSIS — R270 Ataxia, unspecified: Secondary | ICD-10-CM | POA: Diagnosis not present

## 2021-08-11 DIAGNOSIS — R2681 Unsteadiness on feet: Secondary | ICD-10-CM | POA: Diagnosis not present

## 2021-08-13 ENCOUNTER — Other Ambulatory Visit: Payer: Self-pay

## 2021-08-13 ENCOUNTER — Emergency Department (HOSPITAL_BASED_OUTPATIENT_CLINIC_OR_DEPARTMENT_OTHER): Payer: Medicare Other | Admitting: Radiology

## 2021-08-13 ENCOUNTER — Emergency Department (HOSPITAL_BASED_OUTPATIENT_CLINIC_OR_DEPARTMENT_OTHER)
Admission: EM | Admit: 2021-08-13 | Discharge: 2021-08-13 | Disposition: A | Discharge status: Home / Self Care | Payer: Medicare Other | Attending: Emergency Medicine | Admitting: Emergency Medicine

## 2021-08-13 ENCOUNTER — Encounter (HOSPITAL_BASED_OUTPATIENT_CLINIC_OR_DEPARTMENT_OTHER): Payer: Self-pay

## 2021-08-13 DIAGNOSIS — M19012 Primary osteoarthritis, left shoulder: Secondary | ICD-10-CM | POA: Diagnosis not present

## 2021-08-13 DIAGNOSIS — I2510 Atherosclerotic heart disease of native coronary artery without angina pectoris: Secondary | ICD-10-CM | POA: Diagnosis not present

## 2021-08-13 DIAGNOSIS — M436 Torticollis: Secondary | ICD-10-CM | POA: Diagnosis not present

## 2021-08-13 DIAGNOSIS — M25519 Pain in unspecified shoulder: Secondary | ICD-10-CM | POA: Diagnosis not present

## 2021-08-13 DIAGNOSIS — R739 Hyperglycemia, unspecified: Secondary | ICD-10-CM | POA: Diagnosis not present

## 2021-08-13 DIAGNOSIS — Z7901 Long term (current) use of anticoagulants: Secondary | ICD-10-CM | POA: Diagnosis not present

## 2021-08-13 DIAGNOSIS — N189 Chronic kidney disease, unspecified: Secondary | ICD-10-CM | POA: Insufficient documentation

## 2021-08-13 DIAGNOSIS — M542 Cervicalgia: Secondary | ICD-10-CM | POA: Diagnosis not present

## 2021-08-13 DIAGNOSIS — E1122 Type 2 diabetes mellitus with diabetic chronic kidney disease: Secondary | ICD-10-CM | POA: Diagnosis not present

## 2021-08-13 DIAGNOSIS — Z794 Long term (current) use of insulin: Secondary | ICD-10-CM | POA: Insufficient documentation

## 2021-08-13 DIAGNOSIS — I959 Hypotension, unspecified: Secondary | ICD-10-CM | POA: Diagnosis not present

## 2021-08-13 DIAGNOSIS — E1140 Type 2 diabetes mellitus with diabetic neuropathy, unspecified: Secondary | ICD-10-CM | POA: Insufficient documentation

## 2021-08-13 DIAGNOSIS — Z8546 Personal history of malignant neoplasm of prostate: Secondary | ICD-10-CM | POA: Diagnosis not present

## 2021-08-13 LAB — CBG MONITORING, ED: Glucose-Capillary: 210 mg/dL — ABNORMAL HIGH (ref 70–99)

## 2021-08-13 MED ORDER — LIDOCAINE 5 % EX PTCH
1.0000 | MEDICATED_PATCH | CUTANEOUS | Status: DC
Start: 2021-08-13 — End: 2021-08-14
  Administered 2021-08-13: 1 via TRANSDERMAL
  Filled 2021-08-13: qty 1

## 2021-08-13 MED ORDER — HYDROCODONE-ACETAMINOPHEN 5-325 MG PO TABS
1.0000 | ORAL_TABLET | Freq: Once | ORAL | Status: AC
  Administered 2021-08-13: 1 via ORAL
  Filled 2021-08-13: qty 1

## 2021-08-13 MED ORDER — HYDROCODONE-ACETAMINOPHEN 5-325 MG PO TABS: 1.0000 | ORAL_TABLET | ORAL | 0 refills | Status: DC | PRN

## 2021-08-13 NOTE — ED Provider Notes (Signed)
[facility] EMERGENCY DEPT Provider Note   CSN: [id number] Arrival date & time: 08/13/21  1238     History  Chief Complaint  Patient presents with   Torticollis    Steve Brown is a 81 y.o. male.  81 year old male with past medical history of CVA, cervical degenerative disc disease with fusion, GERD, neuropathy, CKD, CAD, PE on Eliquis, diabetes, OSA, prostate cancer presents with complaint of pain left trapezius area.  Patient notes that he realized he was having some discomfort about 3 days ago, worse with any movement of his neck.  Pain radiates towards his left arm.  States that he has been told he needs to have shoulder surgery on this side however has been unable to do so as he lives alone does not feel he be able to care for himself at his age.  Patient has been applying rubbing alcohol and Voltaren to the area without improvement.  He denies falls or injuries, new neurologic deficits or any other complaints or concerns.       Home Medications Prior to Admission medications   Medication Sig Start Date End Date Taking? Authorizing Provider  HYDROcodone-acetaminophen (NORCO/VICODIN) 5-325 MG tablet Take 1 tablet by mouth every 4 (four) hours as needed. 08/13/21  Yes Tacy Learn, PA-C  acetaminophen-codeine (TYLENOL #3) 300-30 MG tablet as needed for pain.    [provider]  albuterol (VENTOLIN HFA) 108 (90 Base) MCG/ACT inhaler Inhale 1-2 puffs into the lungs every 6 (six) hours as needed for wheezing or shortness of breath. 09/06/20   Ward, Lenise Arena, PA-C  bisacodyl (DULCOLAX) 10 MG suppository Place 1 suppository (10 mg total) rectally daily as needed for moderate constipation. 05/31/20   Antonieta Pert, MD  chlorhexidine (PERIDEX) 0.12 % solution as needed. soreness    [provider]  Cholecalciferol (VITAMIN D-1000 MAX ST) 25 MCG (1000 UT) tablet Take by mouth daily.    [provider]  cholestyramine light (PREVALITE) 4 g packet  Take 1 packet by mouth as needed for constipation. 05/23/21   [provider]  clobetasol cream (TEMOVATE) 0.05 % Apply topically as needed for rash. 05/31/21   [provider]  diclofenac Sodium (VOLTAREN) 1 % GEL Apply 4 g topically 4 (four) times daily. 09/03/19   Deno Etienne, DO  fluticasone (FLONASE) 50 MCG/ACT nasal spray Place 1 spray into both nostrils daily as needed for allergies. 06/13/20   Medina-Vargas, Monina C, NP  furosemide (LASIX) 20 MG tablet Take 1-2 tablets (20-40 mg total) by mouth daily as needed for fluid. 06/13/20   Medina-Vargas, Monina C, NP  hydrochlorothiazide (MICROZIDE) 12.5 MG capsule Take 1 capsule (12.5 mg total) by mouth daily. 06/13/20   Medina-Vargas, Monina C, NP  hydrOXYzine (VISTARIL) 25 MG capsule as needed for anxiety.    [provider]  insulin aspart (NOVOLOG) 100 UNIT/ML FlexPen Inject 5 Units into the skin 3 (three) times daily with meals. 06/13/20   Medina-Vargas, Monina C, NP  isosorbide mononitrate (IMDUR) 30 MG 24 hr tablet Take 30 mg by mouth daily. 05/31/21   [provider]  losartan (COZAAR) 100 MG tablet Take 1 tablet (100 mg total) by mouth daily. Patient taking differently: Take 50 mg by mouth daily. 06/13/20   Medina-Vargas, Monina C, NP  meclizine (ANTIVERT) 12.5 MG tablet as needed for dizziness. 04/25/21   [provider]  methocarbamol (ROBAXIN) 500 MG tablet as needed for pain.    [provider]  modafinil (PROVIGIL) 100  MG tablet TAKE 1 TABLET BY MOUTH EVERY DAY 06/29/21   Dohmeier, Asencion Partridge, MD  nitroGLYCERIN (NITROSTAT) 0.4 MG SL tablet as needed.    [provider]  nystatin (MYCOSTATIN/NYSTOP) powder as needed for pain.    [provider]  nystatin cream (MYCOSTATIN) Apply topically as needed for rash. 06/15/21   [provider]  ondansetron (ZOFRAN) 4 MG tablet as needed for nausea/vomiting.    [provider]  OZEMPIC, 0.25 OR 0.5 MG/DOSE, 2 MG/3ML SOPN once a  week. 06/11/21   [provider]  pantoprazole (PROTONIX) 40 MG tablet Take 1 tablet (40 mg total) by mouth every morning. 06/13/20   Medina-Vargas, Monina C, NP  polyethylene glycol (MIRALAX / GLYCOLAX) 17 g packet Take 17 g by mouth daily. 05/31/20   Antonieta Pert, MD  pregabalin (LYRICA) 300 MG capsule Take 300 mg by mouth 2 (two) times daily. 04/26/20   [provider]  rosuvastatin (CRESTOR) 20 MG tablet Take 1 tablet (20 mg total) by mouth daily. 11/29/20   Jettie Booze, MD  sitaGLIPtin (JANUVIA) 50 MG tablet Take 1 tablet (50 mg total) by mouth daily. 06/13/20   Medina-Vargas, Monina C, NP  TRESIBA FLEXTOUCH 100 UNIT/ML FlexTouch Pen Inject 34 Units into the skin daily. 06/13/20   Medina-Vargas, Monina C, NP  valACYclovir (VALTREX) 500 MG tablet Take 500 mg by mouth daily. 01/25/21   [provider]  XARELTO 20 MG TABS tablet Take 1 tablet (20 mg total) by mouth daily. 06/13/20   Medina-Vargas, Monina C, NP      Allergies    Other, Phenergan [promethazine], Iohexol, Iohexol, Phenergan [promethazine], and Tramadol    Review of Systems   Review of Systems Negative except as per HPI Physical Exam Updated Vital Signs BP (!) 144/79 (BP Location: Right Arm)   Pulse 60   Temp 98.5 F (36.9 C)   Resp 14   SpO2 99%  Physical Exam Vitals and nursing note reviewed.  Constitutional:      General: He is not in acute distress.    Appearance: He is well-developed. He is not diaphoretic.  HENT:     Head: Normocephalic and atraumatic.  Cardiovascular:     Rate and Rhythm: Normal rate and regular rhythm.     Pulses: Normal pulses.     Heart sounds: Normal heart sounds.  Pulmonary:     Effort: Pulmonary effort is normal.     Breath sounds: Normal breath sounds.  Abdominal:     Palpations: Abdomen is soft.     Tenderness: There is no abdominal tenderness.  Musculoskeletal:     Cervical back: Tenderness present. No bony tenderness. Pain with movement present.      Thoracic back: No tenderness or bony tenderness.     Lumbar back: No tenderness or bony tenderness.       Back:  Lymphadenopathy:     Cervical: No cervical adenopathy.  Skin:    General: Skin is warm and dry.     Findings: No erythema or rash.  Neurological:     Mental Status: He is alert and oriented to person, place, and time.     Sensory: No sensory deficit.     Motor: No weakness.  Psychiatric:        Behavior: Behavior normal.     ED Results / Procedures / Treatments   Labs (all labs ordered are listed, but only abnormal results are displayed) Labs Reviewed  CBG MONITORING, ED - Abnormal; Notable for the  following components:      Result Value   Glucose-Capillary 210 (*)    All other components within normal limits    EKG None  Radiology DG Cervical Spine Complete  Result Date: 08/13/2021 CLINICAL DATA:  Left lateral neck pain EXAM: CERVICAL SPINE - COMPLETE 4+ VIEW COMPARISON:  CT cervical spine, 05/25/2020 FINDINGS: No fracture or static subluxation of the cervical spine. Unchanged postoperative appearance of anterior cervical discectomy and fusion of C3 through C7. Soft tissues unremarkable. IMPRESSION: No fracture or static subluxation of the cervical spine. Unchanged postoperative appearance of anterior cervical discectomy and fusion of C3 through C7. Electronically Signed   By: Delanna Ahmadi M.D.   On: 08/13/2021 18:51   DG Shoulder Left  Result Date: 08/13/2021 CLINICAL DATA:  Left shoulder pain. EXAM: LEFT SHOULDER - 2+ VIEW COMPARISON:  November 08, 2011 FINDINGS: There is no evidence of fracture or dislocation. Osteoarthritic changes at the glenohumeral joint and acromioclavicular joint, moderate. Soft tissues are normal. IMPRESSION: Osteoarthritic changes at the glenohumeral and acromioclavicular joints, moderate. Electronically Signed   By: Fidela Salisbury M.D.   On: 08/13/2021 14:08    Procedures Procedures    Medications Ordered in ED Medications   lidocaine (LIDODERM) 5 % 1 patch (1 patch Transdermal Patch Applied 08/13/21 1754)  HYDROcodone-acetaminophen (NORCO/VICODIN) 5-325 MG per tablet 1 tablet (1 tablet Oral Given 08/13/21 1754)    ED Course/ Medical Decision Making/ A&P                           Medical Decision Making Amount and/or Complexity of Data Reviewed Radiology: ordered.  Risk Prescription drug management.   81 year old male with left shoulder/neck pain ongoing x 3 days, onset upon waking without injury. Found to have tenderness with palpation along left trapezius area.  Denies any changes in his baseline chronic left shoulder pain.  Strong radial pulse present, sensation intact.  X-ray C-spine and left shoulder obtained with chronic arthritic changes, no acute findings.  Patient is given Lidoderm patch and hydrocodone in the ER with some improvement in his pain.  Recommend continue with hydrocodone as needed as prescribed, Colace as needed and follow-up with his PCP.        Final Clinical Impression(s) / ED Diagnoses Final diagnoses:  Torticollis    Rx / DC Orders ED Discharge Orders          Ordered    HYDROcodone-acetaminophen (NORCO/VICODIN) 5-325 MG tablet  Every 4 hours PRN        08/13/21 1858              Tacy Learn, PA-C 08/13/21 1909    Jeanell Sparrow, DO 08/14/21 [phone]

## 2021-08-13 NOTE — Discharge Instructions (Signed)
Recheck with your primary care provider.  Return to ER for worsening or concerning symptoms.  You can continue applying her topical Voltaren to the area.  We will also give prescription for Norco which she can take as needed as prescribed for severe pain.  This medication can cause constipation.  Recommend taking Colace to help with this.  You can apply warm compresses to the area for 20 minutes at a time followed by gentle stretching.

## 2021-08-13 NOTE — ED Triage Notes (Addendum)
Pt BIB [facility] from home, pt c/o Left shoulder pain radiating down to his fingers x3 days. Hx of the same, recommended surgery but has been receiving steroid injections which has been working until now. Last injection was 3 months   Pt also reports Left lateral neck pain x2 days   Intermittent blurred vision x4 months  Pt also reports posterior head pain "on and off" for years. Has been seen for this, reports it's related to his strokes. Pt reports CT head 3 months ago for same pain, that's when it showed "four strokes" Pt left side grip is weaker than the right, pt reports that's his norm since his strokes, previous strokes resulted in Left side deficits   BP 138/80 HR 80 RR 18 100% RA   CBG 256

## 2021-08-13 NOTE — ED Notes (Signed)
Gave pt water per Dr Pearline Cables order after CBG check

## 2021-08-16 DIAGNOSIS — M79602 Pain in left arm: Secondary | ICD-10-CM | POA: Diagnosis not present

## 2021-08-16 DIAGNOSIS — M62838 Other muscle spasm: Secondary | ICD-10-CM | POA: Diagnosis not present

## 2021-08-16 DIAGNOSIS — Z6832 Body mass index (BMI) 32.0-32.9, adult: Secondary | ICD-10-CM | POA: Diagnosis not present

## 2021-08-16 DIAGNOSIS — R221 Localized swelling, mass and lump, neck: Secondary | ICD-10-CM | POA: Diagnosis not present

## 2021-08-16 DIAGNOSIS — F419 Anxiety disorder, unspecified: Secondary | ICD-10-CM | POA: Diagnosis not present

## 2021-08-16 DIAGNOSIS — H547 Unspecified visual loss: Secondary | ICD-10-CM | POA: Diagnosis not present

## 2021-08-19 DIAGNOSIS — H52203 Unspecified astigmatism, bilateral: Secondary | ICD-10-CM | POA: Diagnosis not present

## 2021-08-19 DIAGNOSIS — H5203 Hypermetropia, bilateral: Secondary | ICD-10-CM | POA: Diagnosis not present

## 2021-08-19 DIAGNOSIS — H35373 Puckering of macula, bilateral: Secondary | ICD-10-CM | POA: Diagnosis not present

## 2021-08-19 DIAGNOSIS — E119 Type 2 diabetes mellitus without complications: Secondary | ICD-10-CM | POA: Diagnosis not present

## 2021-08-25 DIAGNOSIS — I639 Cerebral infarction, unspecified: Secondary | ICD-10-CM | POA: Diagnosis not present

## 2021-08-25 DIAGNOSIS — E1165 Type 2 diabetes mellitus with hyperglycemia: Secondary | ICD-10-CM | POA: Diagnosis not present

## 2021-08-25 DIAGNOSIS — G609 Hereditary and idiopathic neuropathy, unspecified: Secondary | ICD-10-CM | POA: Diagnosis not present

## 2021-08-25 DIAGNOSIS — C61 Malignant neoplasm of prostate: Secondary | ICD-10-CM | POA: Diagnosis not present

## 2021-08-25 DIAGNOSIS — E7800 Pure hypercholesterolemia, unspecified: Secondary | ICD-10-CM | POA: Diagnosis not present

## 2021-08-25 DIAGNOSIS — E669 Obesity, unspecified: Secondary | ICD-10-CM | POA: Diagnosis not present

## 2021-08-25 DIAGNOSIS — N189 Chronic kidney disease, unspecified: Secondary | ICD-10-CM | POA: Diagnosis not present

## 2021-08-25 DIAGNOSIS — I10 Essential (primary) hypertension: Secondary | ICD-10-CM | POA: Diagnosis not present

## 2021-08-29 ENCOUNTER — Ambulatory Visit (HOSPITAL_COMMUNITY)
Admission: EM | Admit: 2021-08-29 | Discharge: 2021-08-29 | Disposition: A | Discharge status: Home / Self Care | Payer: Medicare Other | Attending: Family | Admitting: Family

## 2021-08-29 DIAGNOSIS — F411 Generalized anxiety disorder: Secondary | ICD-10-CM | POA: Diagnosis not present

## 2021-08-29 DIAGNOSIS — H538 Other visual disturbances: Secondary | ICD-10-CM | POA: Insufficient documentation

## 2021-08-29 NOTE — ED Provider Notes (Signed)
Behavioral Health Urgent Care Medical Screening Exam  Patient Name: Steve Brown MRN: [id number] Date of Evaluation: 08/29/21 Chief Complaint:   Diagnosis:  Final diagnoses:  GAD (generalized anxiety disorder)    History of Present illness: Steve Brown is a 81 y.o. male.  Presents to [facility] urgent care due to reported anxiety symptoms.  Initially he reports he is having issues with blurred vision. " That why they sent me here."  States his primary care provider at [facility] family physician referred him.   Steve Brown  reported experiencing anxiety/panic attacks. Reports he attempted to follow-up with Steve Brown last Friday however was on hold for 4 to 5 hours when he attempted to make an appointment.  States he is not able to drive to [place] for appointments due to his poor eye sight. States he got frustrated with the person over the phone and was unable to make an appointment.   Chart review patient has a myriad of health issues that may be attributing to his anxiety.  He recounts a situation last night where he felt like he was fighting his CPAP due to anxiety.  States he typically rests well at night however gets overwhelmed and very anxious. " I could be sitting in my chair and I will feel like I am falling."     He  denying suicidal or homicidal ideations.  Denies auditory visual hallucinations.  Denied previous inpatient admissions.  Denied that he is followed by therapy and/or psychiatry currently. Denies substance abuse use.  Patient is seeking additional outpatient resources. He reported he resides alone.  Discussed following up with geriatric psychologist i.e. Steve Brown.  For possible medication management and/or therapy services.  Patient was receptive to plan.  Denied any other current stressors.  During evaluation Steve Brown is siting in no acute distress. He is alert/oriented x 4; calm/cooperative; and mood congruent with affect. He is speaking in a clear  tone at moderate volume, and normal pace; with good eye contact.  his thought process is coherent and relevant; There is no indication that he is currently responding to internal/external stimuli or experiencing delusional thought content; and he has denied suicidal/self-harm/homicidal ideation, psychosis, and paranoia.   Patient has remained calm throughout assessment and has answered questions appropriately.     At this time Steve Brown is educated and verbalizes understanding of mental health resources and other crisis services in the community. He  is instructed to call 911 and present to the nearest emergency room should he experience any suicidal/homicidal ideation, auditory/visual/hallucinations, or detrimental worsening of his mental health condition. He was a also advised by Probation officer that he could call the toll-free phone on insurance card to assist with identifying in network counselors and agencies or number on back of Medicaid card to  speak with care coordinator.     Psychiatric Specialty Exam  Presentation  General Appearance:Appropriate for Environment  Eye Contact:Good  Speech:Clear and Coherent  Speech Volume:Normal  Handedness:Right   Mood and Affect  Mood:Anxious; Depressed  Affect:Congruent   Thought Process  Thought Processes:Coherent  Descriptions of Associations:Intact  Orientation:Full (Time, Place and Person)  Thought Content:Logical    Hallucinations:None  Ideas of Reference:None  Suicidal Thoughts:No  Homicidal Thoughts:No   Sensorium  Memory:Immediate Good; Recent Good; Remote Good  Judgment:Fair  Insight:No data recorded  Executive Functions  Concentration:Good  Attention Span:Good  [place]  Language:Good   Psychomotor Activity  Psychomotor Activity:Normal   Assets  Assets:Desire for Improvement; Social Support  Sleep  Sleep:Fair  Number of hours: No data recorded  Nutritional  Assessment (For OBS and FBC admissions only) Has the patient had a weight loss or gain of 10 pounds or more in the last 3 months?: No Has the patient had a decrease in food intake/or appetite?: No Does the patient have dental problems?: No Does the patient have eating habits or behaviors that may be indicators of an eating disorder including binging or inducing vomiting?: No Has the patient recently lost weight without trying?: 0 Has the patient been eating poorly because of a decreased appetite?: 0 Malnutrition Screening Tool Score: 0    Physical Exam: Physical Exam Vitals and nursing note reviewed.  Cardiovascular:     Rate and Rhythm: Normal rate.  Pulmonary:     Effort: Pulmonary effort is normal.  Neurological:     Mental Status: He is alert and oriented to person, place, and time.  Psychiatric:        Mood and Affect: Mood normal.        Thought Content: Thought content normal.    Review of Systems  Respiratory: Negative.    Cardiovascular: Negative.   Genitourinary: Negative.   Psychiatric/Behavioral:  Negative for depression. The patient is nervous/anxious.   All other systems reviewed and are negative.  There were no vitals taken for this visit. There is no height or weight on file to calculate BMI.  Musculoskeletal: Strength & Muscle Tone: within normal limits Gait & Station: normal Patient leans: N/A   [place] [facility] Discharge Disposition for Follow up and Recommendations: Based on my evaluation the patient does not appear to have an emergency medical condition and can be discharged with resources and follow up care in outpatient services for Medication Management and Individual Therapy   Derrill [place], NP 08/29/2021, 4:13 PM

## 2021-08-29 NOTE — Discharge Instructions (Addendum)
Take all medications as prescribed. Keep all follow-up appointments as scheduled.  Do not consume alcohol or use illegal drugs while on prescription medications. Report any adverse effects from your medications to your primary care provider promptly.  In the event of recurrent symptoms or worsening symptoms, call 911, a crisis hotline, or go to the nearest emergency department for evaluation.   

## 2021-08-29 NOTE — BH Assessment (Signed)
Patient is a 81 year old male that presents this date voluntary referred from his PCP Harrington Challenger MD this date after he was experiencing ongoing anxiety for the last few months. Patient denies any S/I, H/I or AVH. Patient reports he has received Vistaril in the past to manage symptoms although that was over a year ago. Patient denies any SA issues. Patient states lately his symptoms have worsened feeling "overwhelmed" especially when he is driving. Patient states his vision has also worsened over the last 6 months and he "feels nervous" while driving especially on the interstate. Patient is routine

## 2021-08-30 ENCOUNTER — Ambulatory Visit: Payer: 59 | Admitting: Interventional Cardiology

## 2021-08-30 DIAGNOSIS — R2681 Unsteadiness on feet: Secondary | ICD-10-CM | POA: Diagnosis not present

## 2021-08-30 DIAGNOSIS — R270 Ataxia, unspecified: Secondary | ICD-10-CM | POA: Diagnosis not present

## 2021-09-01 DIAGNOSIS — R270 Ataxia, unspecified: Secondary | ICD-10-CM | POA: Diagnosis not present

## 2021-09-01 DIAGNOSIS — R2681 Unsteadiness on feet: Secondary | ICD-10-CM | POA: Diagnosis not present

## 2021-09-05 DIAGNOSIS — R2681 Unsteadiness on feet: Secondary | ICD-10-CM | POA: Diagnosis not present

## 2021-09-05 DIAGNOSIS — R270 Ataxia, unspecified: Secondary | ICD-10-CM | POA: Diagnosis not present

## 2021-09-07 DIAGNOSIS — R270 Ataxia, unspecified: Secondary | ICD-10-CM | POA: Diagnosis not present

## 2021-09-07 DIAGNOSIS — R2681 Unsteadiness on feet: Secondary | ICD-10-CM | POA: Diagnosis not present

## 2021-09-13 DIAGNOSIS — R2681 Unsteadiness on feet: Secondary | ICD-10-CM | POA: Diagnosis not present

## 2021-09-13 DIAGNOSIS — R270 Ataxia, unspecified: Secondary | ICD-10-CM | POA: Diagnosis not present

## 2021-09-15 ENCOUNTER — Ambulatory Visit (HOSPITAL_BASED_OUTPATIENT_CLINIC_OR_DEPARTMENT_OTHER): Payer: Medicare Other | Admitting: Psychiatry

## 2021-09-15 DIAGNOSIS — F409 Phobic anxiety disorder, unspecified: Secondary | ICD-10-CM

## 2021-09-15 NOTE — Progress Notes (Signed)
Psychiatric Initial Adult Assessment   Patient Identification: Steve Brown MRN:  [id number] Date of Evaluation:  09/15/2021 Referral Source: Dr. Harrington Challenger Chief Complaint: Anxiety   Visit Diagnosis:  This patient is a 81 year old African-American retired male is being evaluated for essentially a phobia.  He does not have anxiety during the day.  In fact he never has anxiety other than when he is approaching a bridge on his way home.  He makes it through the bridge but it is very uncomfortable.  He does not want to go around and avoid the bridge because it will take him an hour out of his way.  He goes over the bridge but he is very very uncomfortable.  He also gets uncomfortable when he is in a lot of traffic.  This patient has never been in a motor vehicle accident.  He has not witnessed accidents on the road.  He denies daily depression.  He is sleeping well on a CPAP machine.  He is eating well has good energy and has no problems thinking or concentrating.  He is not suicidal now and never has been.  The patient enjoys going to the [facility] 3 times a week and watches TV.  His daughter and his grandson live here in [place].  He has some other siblings in [place].  The patient is been retired from the post office for many years.  The patient has never been psychotic.  He denies symptoms of mania or of major depression.  Specifically he denies symptoms consistent with generalized anxiety disorder, panic disorder or excessive compulsive disorder. His past psychiatric history is significant for never being in a psychiatric hospital but he did see a psychiatrist in [place] years ago but does not remember what their diagnosis was.  The patient has a number of medical illnesses.  This includes insulin-dependent diabetes.  History of Present Illness:   Noted above  Associated Signs/Symptoms: Depression Symptoms:   (Hypo) Manic Symptoms:  Anxiety Symptoms:   Psychotic Symptoms:   PTSD  Symptoms: NA  Past Psychiatric History:   Previous Psychotropic Medications: No   Substance Abuse History in the last 12 months:  No.  Consequences of Substance Abuse: NA  Past Medical History:  Past Medical History:  Diagnosis Date   Abnormal prostate biopsy    Anticoagulant long-term use    currently xarelto   BPH with elevated PSA    CKD (chronic kidney disease), stage II    Complication of anesthesia    limted neck rom limited use of left arm due to cva   Coronary artery disease    CARDIOLOGIST-  DR Irish Lack--  2010-- PCI w/ stenting midLAD   DDD (degenerative disc disease), lumbar    Degeneration of cervical intervertebral disc    Depression    Diabetes mellitus without complication (HCC)    Dyspnea on exertion    GERD (gastroesophageal reflux disease)    Hemiparesis due to cerebral infarction    History of cerebrovascular accident (CVA) with residual deficit 2002 and 2003--  hemiparisis both sides   per MRI  anterior left frontal lobe, left para midline pons, and inferior cerebullam bilaterally infarcts   History of pulmonary embolus (PE)    06-30-2012  extensive bilaterally   History of recurrent TIAs    History of syncope    hx multiple pre-syncope and syncopal episodes due to vasovagal, orthostatic hypotension, dehydration   History of TIAs    several since 2002   Hyperlipidemia  Hypertension    Mild atherosclerosis of carotid artery, bilateral    per last duplex 11-04-2014  bilateral ICA 1--39%   Neuropathy    fingers   OSA on CPAP    followed by dr dohmeier--  sev. osa w/ AHI 65.9   Prostate cancer ([place]) dx 2018   Renal insufficiency    S/P coronary artery stent placement 2010   stenting to mid LAD   Simple renal cyst    bilaterally   Stroke [facility])    Trigger finger of both hands 11-17-13   Type 2 diabetes mellitus ([place]) dx 1986   last one A1c 9.2 on 04-26-2016   Unsteady gait    . Hx prior CVA/TIAs;   Vertebral artery occlusion, left    chronic     Past Surgical History:  Procedure Laterality Date   ANTERIOR CERVICAL DECOMP/DISCECTOMY FUSION  2004   C3 -- C6 limited rom   CARDIAC CATHETERIZATION  06-10-2010   dr Irish Lack   wide patent LAD stent, mid lesion at the origin of the septal prior to the previous stent 40-50%/  normal LVF, ef 55%   CARDIOVASCULAR STRESS TEST  10-23-2012  dr Irish Lack   normal nuclear perfusion study w/ no ischemia/  normal LV function and wall motion , ef 65%   CARPAL TUNNEL RELEASE Bilateral    CATARACT EXTRACTION W/ INTRAOCULAR LENS  IMPLANT, BILATERAL     CHOLECYSTECTOMY N/A 11/02/2015   Procedure: LAPAROSCOPIC CHOLECYSTECTOMY WITH INTRAOPERATIVE CHOLANGIOGRAM;  Surgeon: Donnie Mesa, MD;  Location: [facility];  Service: General;  Laterality: N/A;   COLONOSCOPY     CORONARY ANGIOPLASTY WITH STENT PLACEMENT  02/2008   stenting to mid LAD   GOLD SEED IMPLANT N/A 11/15/2016   Procedure: [place];  Surgeon: Ardis Hughs, MD;  Location[facility];  Service: Urology;  Laterality: N/A;   IR ANGIO INTRA EXTRACRAN SEL COM CAROTID INNOMINATE BILAT MOD SED  06/13/2018   IR ANGIO VERTEBRAL SEL VERTEBRAL UNI R MOD SED  06/13/2018   IR US GUIDE VASC ACCESS RIGHT  06/13/2018   LEFT HEART CATH AND CORONARY ANGIOGRAPHY N/A 05/25/2017   Procedure: LEFT HEART CATH AND CORONARY ANGIOGRAPHY;  Surgeon: Jettie Booze, MD;  Location: [facility];  Service: Cardiovascular;  Laterality: N/A;   LEFT HEART CATHETERIZATION WITH CORONARY ANGIOGRAM N/A 04/03/2013   Procedure: LEFT HEART CATHETERIZATION WITH CORONARY ANGIOGRAM;  Surgeon: Jettie Booze, MD;  Location: [facility] C[facility] LAB;  Service: Cardiovascular;  Laterality: N/A;  patent mLAD stent  w/ mild disease in remainder LAD and its branches;  mod. focal lesion midLCFx- FFR of lesion was negative for ischemia/  normal LVSF, ef 50%   lungs  2005   "fluid pumped off lungs"   NEUROPLASTY / TRANSPOSITION ULNAR NERVE AT ELBOW Right 2004    PROSTATE BIOPSY N/A 08/31/2016   Procedure: PROSTATE  BIOPSY TRANSRECTAL ULTRASONIC PROSTATE (TUBP);  Surgeon: Ardis Hughs, MD;  Location: [facility];  Service: Urology;  Laterality: N/A;   SPACE OAR INSTILLATION N/A 11/15/2016   Procedure: SPACE OAR INSTILLATION;  Surgeon: Ardis Hughs, MD;  Location: [facility];  Service: Urology;  Laterality: N/A;   TRANSTHORACIC ECHOCARDIOGRAM  04/27/2016   severe focal basal LVH, ef 60-65%,  grade 2 diastoilc dysfunction/  mild AR, MR, and TR/  atrial septum lipomatous hypertrophy/  PASP 10CHEN   UMBILICAL HERNIA REPAIR     Family Psychiatric History:   Family  History:  Family History  Problem Relation Age of Onset   Aneurysm Mother    Cancer Father        unknown either pancreatic or prostate   Dementia Sister    Stroke Brother    Heart attack Neg Hx    Sleep apnea Neg Hx     Social History:   Social History   Socioeconomic History   Marital status: Single    Spouse name: Not on file   Number of children: 4   Years of education: 12   Highest education level: 12th grade  Occupational History    Employer: RETIRED    Comment: retired  Tobacco Use   Smoking status: Never   Smokeless tobacco: Never  Vaping Use   Vaping Use: Never used  Substance and Sexual Activity   Alcohol use: Not Currently   Drug use: Never   Sexual activity: Not Currently  Other Topics Concern   Not on file  Social History Narrative   Patient lives at home alone and he is single.     Patient is retired.    Caffeine - one cup daily.   Right handed.   Social Determinants of Health   Financial Resource Strain: Low Risk  (02/10/2019)   Overall Financial Resource Strain (CARDIA)    Difficulty of Paying Living Expenses: Not hard at all  Food Insecurity: No Food Insecurity (09/09/2019)   Hunger Vital Sign    Worried About Running Out of Food in the Last Year: Never true    Ran Out of Food in the Last Year: Never  true  Transportation Needs: No Transportation Needs (09/09/2019)   PRAPARE - Hydrologist (Medical): No    Lack of Transportation (Non-Medical): No  Physical Activity: Inactive (09/09/2019)   Exercise Vital Sign    Days of Exercise per Week: 0 days    Minutes of Exercise per Session: 0 min  Stress: Not on file  Social Connections: Not on file    Additional Social History:   Allergies:   Allergies  Allergen Reactions   Other Other (See Comments)    Per patient- cardiac cath dye-  "woke up during procedure hysterical."   Phenergan [Promethazine] Other (See Comments)    Mood changes    Iohexol Other (See Comments)    Patient refuses contrast after having a hysterical event in hospital //r ls spoke with patient    Iohexol    Phenergan [Promethazine]     Other reaction(s): syncope   Tramadol     Metabolic Disorder Labs: Lab Results  Component Value Date   HGBA1C 7.5 (H) 05/26/2020   MPG 168.55 05/26/2020   MPG 168.55 08/12/2019   No results found for: "PROLACTIN" Lab Results  Component Value Date   CHOL 139 11/24/2019   TRIG 70 11/24/2019   HDL 53 11/24/2019   CHOLHDL 2.6 11/24/2019   VLDL 21 08/12/2019   LDLCALC 72 11/24/2019   LDLCALC 138 (H) 08/12/2019   Lab Results  Component Value Date   TSH 1.731 05/26/2020    Therapeutic Level Labs: No results found for: "LITHIUM" No results found for: "CBMZ" No results found for: "VALPROATE"  Current Medications: Current Outpatient Medications  Medication Sig Dispense Refill   acetaminophen-codeine (TYLENOL #3) 300-30 MG tablet as needed for pain.     albuterol (VENTOLIN HFA) 108 (90 Base) MCG/ACT inhaler Inhale 1-2 puffs into the lungs every 6 (six) hours as needed for wheezing or shortness of breath.  1 each 0   bisacodyl (DULCOLAX) 10 MG suppository Place 1 suppository (10 mg total) rectally daily as needed for moderate constipation. 12 suppository 0   chlorhexidine (PERIDEX) 0.12 %  solution as needed. soreness     Cholecalciferol (VITAMIN D-1000 MAX ST) 25 MCG (1000 UT) tablet Take by mouth daily.     cholestyramine light (PREVALITE) 4 g packet Take 1 packet by mouth as needed for constipation.     clobetasol cream (TEMOVATE) 0.05 % Apply topically as needed for rash.     diclofenac Sodium (VOLTAREN) 1 % GEL Apply 4 g topically 4 (four) times daily. 100 g 0   fluticasone (FLONASE) 50 MCG/ACT nasal spray Place 1 spray into both nostrils daily as needed for allergies. 16 g 0   furosemide (LASIX) 20 MG tablet Take 1-2 tablets (20-40 mg total) by mouth daily as needed for fluid. 30 tablet 0   hydrochlorothiazide (MICROZIDE) 12.5 MG capsule Take 1 capsule (12.5 mg total) by mouth daily. 30 capsule 0   HYDROcodone-acetaminophen (NORCO/VICODIN) 5-325 MG tablet Take 1 tablet by mouth every 4 (four) hours as needed. 10 tablet 0   hydrOXYzine (VISTARIL) 25 MG capsule as needed for anxiety.     insulin aspart (NOVOLOG) 100 UNIT/ML FlexPen Inject 5 Units into the skin 3 (three) times daily with meals. 15 mL 0   isosorbide mononitrate (IMDUR) 30 MG 24 hr tablet Take 30 mg by mouth daily.     losartan (COZAAR) 100 MG tablet Take 1 tablet (100 mg total) by mouth daily. (Patient taking differently: Take 50 mg by mouth daily.) 30 tablet 0   meclizine (ANTIVERT) 12.5 MG tablet as needed for dizziness.     methocarbamol (ROBAXIN) 500 MG tablet as needed for pain.     modafinil (PROVIGIL) 100 MG tablet TAKE 1 TABLET BY MOUTH EVERY DAY 30 tablet 5   nitroGLYCERIN (NITROSTAT) 0.4 MG SL tablet as needed.     nystatin (MYCOSTATIN/NYSTOP) powder as needed for pain.     nystatin cream (MYCOSTATIN) Apply topically as needed for rash.     ondansetron (ZOFRAN) 4 MG tablet as needed for nausea/vomiting.     OZEMPIC, 0.25 OR 0.5 MG/DOSE, 2 MG/3ML SOPN once a week.     pantoprazole (PROTONIX) 40 MG tablet Take 1 tablet (40 mg total) by mouth every morning. 30 tablet 0   polyethylene glycol (MIRALAX /  GLYCOLAX) 17 g packet Take 17 g by mouth daily. 14 each 0   pregabalin (LYRICA) 300 MG capsule Take 300 mg by mouth 2 (two) times daily.     rosuvastatin (CRESTOR) 20 MG tablet Take 1 tablet (20 mg total) by mouth daily. 90 tablet 3   sitaGLIPtin (JANUVIA) 50 MG tablet Take 1 tablet (50 mg total) by mouth daily. 30 tablet 0   TRESIBA FLEXTOUCH 100 UNIT/ML FlexTouch Pen Inject 34 Units into the skin daily. 3 mL 0   valACYclovir (VALTREX) 500 MG tablet Take 500 mg by mouth daily.     XARELTO 20 MG TABS tablet Take 1 tablet (20 mg total) by mouth daily. 30 tablet 0   No current facility-administered medications for this visit.    Musculoskeletal: Strength & Muscle Tone: within normal limits Gait & Station: normal Patient leans: N/A  Psychiatric Specialty Exam: Review of Systems  There were no vitals taken for this visit.There is no height or weight on file to calculate BMI.  General Appearance: Casual  Eye Contact:  Good  Speech:  Negative  Volume:  Normal  Mood:  Negative  Affect:  NA  Thought Process:  Goal Directed  Orientation:  Full (Time, Place, and Person)  Thought Content:  WDL  Suicidal Thoughts:  No  Homicidal Thoughts:  No  Memory:  NA  Judgement:  Good  Insight:  Good  Psychomotor Activity:  Normal  Concentration:    Recall:  Good  Fund of Knowledge:Good  Language: Good  Akathisia:  No  Handed:  Right  AIMS (if indicated):  not done  Assets:  Desire for Improvement  ADL's:  Intact  Cognition: WNL  Sleep:  Good   Screenings: CAGE-AID    Flowsheet Row ED to Hosp-Admission (Discharged) from 05/25/2020 in [facility] Office Visit from 08/23/2020 in [facility] Office Visit from 07/31/2017 in [facility]  Total Score (max 30 points ) 25 26      PHQ2-9    Flowsheet Row Patient Outreach Telephone from 09/09/2019 in [facility] Patient Outreach Telephone from 08/01/2019 in [facility] Patient Outreach Telephone from 04/30/2019 in [facility] Patient Outreach Telephone from 04/28/2019 in [facility] Patient Outreach Telephone from 02/10/2019 in [facility]  PHQ-2 Total Score '1 3 4 3 1  '$ PHQ-9 Total Score -- '6 13 6 '$ --      Flowsheet Row ED from 08/13/2021 in [facility] ED from 05/30/2021 in [facility] ED from 04/20/2021 in [facility] No Risk No Risk No Risk      Assessment and Plan:   At this time 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  He gets moderate anxiety when he is in heavy traffic but he still goes through the traffic and still goes over the bridges.  He just feels uncomfortable in the setting.  I think the best approach would be a psychological approach using behavioral cognitive procedures.  I think the patient should be referred to a psychologist to help him deal with these everyday circumstances.  I will make an attempt to contact [facility] psychology Department.  Collaboration of Care: The patient will not be given a return appointment with me.  The patient is functioning very well.  Patient/Guardian was advised Release of Information must be obtained prior to any record release in order to collaborate their care with an outside provider. Patient/Guardian was advised if they have not already done so to contact the registration department to sign all necessary forms in order for [place] to release information regarding their care.   Consent: Patient/Guardian gives verbal consent for treatment and assignment of benefits for services provided during this visit. Patient/Guardian expressed understanding and agreed to proceed.   Jerral Ralph, MD 9/7/202310:44 AM

## 2021-09-19 DIAGNOSIS — R270 Ataxia, unspecified: Secondary | ICD-10-CM | POA: Diagnosis not present

## 2021-09-19 DIAGNOSIS — R2681 Unsteadiness on feet: Secondary | ICD-10-CM | POA: Diagnosis not present

## 2021-09-21 DIAGNOSIS — R2681 Unsteadiness on feet: Secondary | ICD-10-CM | POA: Diagnosis not present

## 2021-09-21 DIAGNOSIS — R270 Ataxia, unspecified: Secondary | ICD-10-CM | POA: Diagnosis not present

## 2021-09-26 DIAGNOSIS — R270 Ataxia, unspecified: Secondary | ICD-10-CM | POA: Diagnosis not present

## 2021-09-26 DIAGNOSIS — R2681 Unsteadiness on feet: Secondary | ICD-10-CM | POA: Diagnosis not present

## 2021-09-28 DIAGNOSIS — R270 Ataxia, unspecified: Secondary | ICD-10-CM | POA: Diagnosis not present

## 2021-09-28 DIAGNOSIS — R2681 Unsteadiness on feet: Secondary | ICD-10-CM | POA: Diagnosis not present

## 2021-10-03 DIAGNOSIS — R270 Ataxia, unspecified: Secondary | ICD-10-CM | POA: Diagnosis not present

## 2021-10-03 DIAGNOSIS — R2681 Unsteadiness on feet: Secondary | ICD-10-CM | POA: Diagnosis not present

## 2021-10-05 DIAGNOSIS — R270 Ataxia, unspecified: Secondary | ICD-10-CM | POA: Diagnosis not present

## 2021-10-05 DIAGNOSIS — R2681 Unsteadiness on feet: Secondary | ICD-10-CM | POA: Diagnosis not present

## 2021-10-06 ENCOUNTER — Encounter: Payer: Self-pay | Admitting: Emergency Medicine

## 2021-10-06 ENCOUNTER — Emergency Department (HOSPITAL_COMMUNITY): Payer: Medicare Other

## 2021-10-06 ENCOUNTER — Emergency Department (HOSPITAL_COMMUNITY)
Admission: EM | Admit: 2021-10-06 | Discharge: 2021-10-06 | Disposition: A | Discharge status: Home / Self Care | Payer: Medicare Other | Attending: Emergency Medicine | Admitting: Emergency Medicine

## 2021-10-06 ENCOUNTER — Ambulatory Visit
Admission: EM | Admit: 2021-10-06 | Discharge: 2021-10-06 | Disposition: A | Discharge status: Home / Self Care | Payer: Medicare Other

## 2021-10-06 DIAGNOSIS — N3289 Other specified disorders of bladder: Secondary | ICD-10-CM | POA: Diagnosis not present

## 2021-10-06 DIAGNOSIS — K625 Hemorrhage of anus and rectum: Secondary | ICD-10-CM

## 2021-10-06 DIAGNOSIS — I959 Hypotension, unspecified: Secondary | ICD-10-CM | POA: Diagnosis not present

## 2021-10-06 DIAGNOSIS — Z79899 Other long term (current) drug therapy: Secondary | ICD-10-CM | POA: Diagnosis not present

## 2021-10-06 DIAGNOSIS — R6883 Chills (without fever): Secondary | ICD-10-CM | POA: Diagnosis not present

## 2021-10-06 DIAGNOSIS — I2510 Atherosclerotic heart disease of native coronary artery without angina pectoris: Secondary | ICD-10-CM | POA: Insufficient documentation

## 2021-10-06 DIAGNOSIS — Z20822 Contact with and (suspected) exposure to covid-19: Secondary | ICD-10-CM | POA: Diagnosis not present

## 2021-10-06 DIAGNOSIS — Z794 Long term (current) use of insulin: Secondary | ICD-10-CM | POA: Insufficient documentation

## 2021-10-06 DIAGNOSIS — Z7901 Long term (current) use of anticoagulants: Secondary | ICD-10-CM | POA: Insufficient documentation

## 2021-10-06 DIAGNOSIS — R52 Pain, unspecified: Secondary | ICD-10-CM

## 2021-10-06 DIAGNOSIS — R319 Hematuria, unspecified: Secondary | ICD-10-CM | POA: Diagnosis not present

## 2021-10-06 DIAGNOSIS — I10 Essential (primary) hypertension: Secondary | ICD-10-CM | POA: Diagnosis not present

## 2021-10-06 DIAGNOSIS — N3090 Cystitis, unspecified without hematuria: Secondary | ICD-10-CM

## 2021-10-06 DIAGNOSIS — K5730 Diverticulosis of large intestine without perforation or abscess without bleeding: Secondary | ICD-10-CM | POA: Diagnosis not present

## 2021-10-06 DIAGNOSIS — N3091 Cystitis, unspecified with hematuria: Secondary | ICD-10-CM | POA: Insufficient documentation

## 2021-10-06 DIAGNOSIS — R300 Dysuria: Secondary | ICD-10-CM | POA: Diagnosis not present

## 2021-10-06 DIAGNOSIS — M791 Myalgia, unspecified site: Secondary | ICD-10-CM | POA: Insufficient documentation

## 2021-10-06 LAB — COMPREHENSIVE METABOLIC PANEL
ALT: 48 U/L — ABNORMAL HIGH (ref 0–44)
AST: 31 U/L (ref 15–41)
Albumin: 3.5 g/dL (ref 3.5–5.0)
Alkaline Phosphatase: 84 U/L (ref 38–126)
Anion gap: 5 (ref 5–15)
BUN: 42 mg/dL — ABNORMAL HIGH (ref 8–23)
CO2: 28 mmol/L (ref 22–32)
Calcium: 8.8 mg/dL — ABNORMAL LOW (ref 8.9–10.3)
Chloride: 107 mmol/L (ref 98–111)
Creatinine, Ser: 1.56 mg/dL — ABNORMAL HIGH (ref 0.61–1.24)
GFR, Estimated: 45 mL/min — ABNORMAL LOW (ref >60)
Glucose, Bld: 187 mg/dL — ABNORMAL HIGH (ref 70–99)
Potassium: 4 mmol/L (ref 3.5–5.1)
Sodium: 140 mmol/L (ref 135–145)
Total Bilirubin: 0.9 mg/dL (ref 0.3–1.2)
Total Protein: 6.8 g/dL (ref 6.5–8.1)

## 2021-10-06 LAB — URINALYSIS, ROUTINE W REFLEX MICROSCOPIC
Bilirubin Urine: NEGATIVE
Glucose, UA: NEGATIVE mg/dL
Ketones, ur: NEGATIVE mg/dL
Nitrite: NEGATIVE
Protein, ur: 100 mg/dL — AB
Specific Gravity, Urine: 1.013 (ref 1.005–1.030)
WBC, UA: >50 WBC/hpf — ABNORMAL HIGH (ref 0–5)
pH: 6 (ref 5.0–8.0)

## 2021-10-06 LAB — CBC WITH DIFFERENTIAL/PLATELET
Abs Immature Granulocytes: 0.02 10*3/uL (ref 0.00–0.07)
Basophils Absolute: 0 10*3/uL (ref 0.0–0.1)
Basophils Relative: 0 %
Eosinophils Absolute: 0 10*3/uL (ref 0.0–0.5)
Eosinophils Relative: 0 %
HCT: 37.5 % — ABNORMAL LOW (ref 39.0–52.0)
Hemoglobin: 12.5 g/dL — ABNORMAL LOW (ref 13.0–17.0)
Immature Granulocytes: 0 %
Lymphocytes Relative: 24 %
Lymphs Abs: 1.8 10*3/uL (ref 0.7–4.0)
MCH: 32.1 pg (ref 26.0–34.0)
MCHC: 33.3 g/dL (ref 30.0–36.0)
MCV: 96.4 fL (ref 80.0–100.0)
Monocytes Absolute: 0.9 10*3/uL (ref 0.1–1.0)
Monocytes Relative: 12 %
Neutro Abs: 4.7 10*3/uL (ref 1.7–7.7)
Neutrophils Relative %: 64 %
Platelets: 170 10*3/uL (ref 150–400)
RBC: 3.89 MIL/uL — ABNORMAL LOW (ref 4.22–5.81)
RDW: 12.8 % (ref 11.5–15.5)
WBC: 7.5 10*3/uL (ref 4.0–10.5)
nRBC: 0 % (ref 0.0–0.2)

## 2021-10-06 LAB — LACTIC ACID, PLASMA: Lactic Acid, Venous: 1.3 mmol/L (ref 0.5–1.9)

## 2021-10-06 LAB — SARS CORONAVIRUS 2 BY RT PCR: SARS Coronavirus 2 by RT PCR: NEGATIVE

## 2021-10-06 MED ORDER — SODIUM CHLORIDE 0.9 % IV BOLUS
1000.0000 mL | Freq: Once | INTRAVENOUS | Status: AC
  Administered 2021-10-06: 1000 mL via INTRAVENOUS

## 2021-10-06 MED ORDER — SODIUM CHLORIDE 0.9 % IV SOLN
1.0000 g | Freq: Once | INTRAVENOUS | Status: AC
  Administered 2021-10-06: 1 g via INTRAVENOUS
  Filled 2021-10-06: qty 10

## 2021-10-06 MED ORDER — CEPHALEXIN 500 MG PO CAPS: 500.0000 mg | ORAL_CAPSULE | Freq: Three times a day (TID) | ORAL | 0 refills | Status: DC

## 2021-10-06 NOTE — ED Provider Notes (Signed)
[facility] DEPT Provider Note   CSN: [id number] Arrival date & time: 10/06/21  1618     History  Chief Complaint  Patient presents with   Hematuria    Steve Brown is a 81 y.o. male history of CAD, hypertension here presenting with chills and hematuria and body aches.  Patient has been having chills for the last week or so.  Patient also has some gross hematuria.  He has some bilateral flank pain as well.  Denies any dysuria.  Patient to urgent care and was noted to be hypotensive and was sent here for further evaluation.  Patient did not take his temperature at home but has subjective chills and fever.  Denies any COVID exposure.  The history is provided by the patient.       Home Medications Prior to Admission medications   Medication Sig Start Date End Date Taking? Authorizing Provider  acetaminophen-codeine (TYLENOL #3) 300-30 MG tablet as needed for pain.    [provider]  albuterol (VENTOLIN HFA) 108 (90 Base) MCG/ACT inhaler Inhale 1-2 puffs into the lungs every 6 (six) hours as needed for wheezing or shortness of breath. 09/06/20   Ward, Lenise Arena, PA-C  bisacodyl (DULCOLAX) 10 MG suppository Place 1 suppository (10 mg total) rectally daily as needed for moderate constipation. 05/31/20   Antonieta Pert, MD  chlorhexidine (PERIDEX) 0.12 % solution as needed. soreness    [provider]  Cholecalciferol (VITAMIN D-1000 MAX ST) 25 MCG (1000 UT) tablet Take by mouth daily.    [provider]  cholestyramine light (PREVALITE) 4 g packet Take 1 packet by mouth as needed for constipation. 05/23/21   [provider]  clobetasol cream (TEMOVATE) 0.05 % Apply topically as needed for rash. 05/31/21   [provider]  diclofenac Sodium (VOLTAREN) 1 % GEL Apply 4 g topically 4 (four) times daily. 09/03/19   Deno Etienne, DO  fluticasone (FLONASE) 50 MCG/ACT nasal spray Place 1 spray into both nostrils daily as needed  for allergies. 06/13/20   Medina-Vargas, Monina C, NP  furosemide (LASIX) 20 MG tablet Take 1-2 tablets (20-40 mg total) by mouth daily as needed for fluid. 06/13/20   Medina-Vargas, Monina C, NP  hydrochlorothiazide (MICROZIDE) 12.5 MG capsule Take 1 capsule (12.5 mg total) by mouth daily. 06/13/20   Medina-Vargas, Monina C, NP  HYDROcodone-acetaminophen (NORCO/VICODIN) 5-325 MG tablet Take 1 tablet by mouth every 4 (four) hours as needed. 08/13/21   Tacy Learn, PA-C  hydrOXYzine (VISTARIL) 25 MG capsule as needed for anxiety.    [provider]  insulin aspart (NOVOLOG) 100 UNIT/ML FlexPen Inject 5 Units into the skin 3 (three) times daily with meals. 06/13/20   Medina-Vargas, Monina C, NP  isosorbide mononitrate (IMDUR) 30 MG 24 hr tablet Take 30 mg by mouth daily. 05/31/21   [provider]  losartan (COZAAR) 100 MG tablet Take 1 tablet (100 mg total) by mouth daily. Patient taking differently: Take 50 mg by mouth daily. 06/13/20   Medina-Vargas, Monina C, NP  meclizine (ANTIVERT) 12.5 MG tablet as needed for dizziness. 04/25/21   [provider]  methocarbamol (ROBAXIN) 500 MG tablet as needed for pain.    [provider]  modafinil (PROVIGIL) 100 MG tablet TAKE 1 TABLET BY MOUTH EVERY DAY 06/29/21   Dohmeier, Asencion Partridge, MD  nitroGLYCERIN (NITROSTAT) 0.4 MG SL tablet as needed.    [provider]  nystatin (MYCOSTATIN/NYSTOP) powder as needed for pain.    [provider]  nystatin cream (MYCOSTATIN) Apply topically as needed for rash. 06/15/21   [provider]  ondansetron (ZOFRAN) 4 MG tablet as needed for nausea/vomiting.    [provider]  OZEMPIC, 0.25 OR 0.5 MG/DOSE, 2 MG/3ML SOPN once a week. 06/11/21   [provider]  pantoprazole (PROTONIX) 40 MG tablet Take 1 tablet (40 mg total) by mouth every morning. 06/13/20   Medina-Vargas, Monina C, NP  polyethylene glycol (MIRALAX / GLYCOLAX) 17 g packet Take 17 g by mouth daily.  05/31/20   Antonieta Pert, MD  pregabalin (LYRICA) 300 MG capsule Take 300 mg by mouth 2 (two) times daily. 04/26/20   [provider]  rosuvastatin (CRESTOR) 20 MG tablet Take 1 tablet (20 mg total) by mouth daily. 11/29/20   Jettie Booze, MD  sitaGLIPtin (JANUVIA) 50 MG tablet Take 1 tablet (50 mg total) by mouth daily. 06/13/20   Medina-Vargas, Monina C, NP  TRESIBA FLEXTOUCH 100 UNIT/ML FlexTouch Pen Inject 34 Units into the skin daily. 06/13/20   Medina-Vargas, Monina C, NP  valACYclovir (VALTREX) 500 MG tablet Take 500 mg by mouth daily. 01/25/21   [provider]  XARELTO 20 MG TABS tablet Take 1 tablet (20 mg total) by mouth daily. 06/13/20   Medina-Vargas, Monina C, NP      Allergies    Other, Phenergan [promethazine], Iohexol, Iohexol, Phenergan [promethazine], and Tramadol    Review of Systems   Review of Systems  Constitutional:  Positive for chills and fever.  Genitourinary:  Positive for hematuria.  All other systems reviewed and are negative.   Physical Exam Updated Vital Signs BP (!) 111/58   Pulse 71   Resp 17   Ht '5\' 5"'$  (1.651 m)   Wt 85.3 kg   SpO2 96%   BMI 31.28 kg/m  Physical Exam Vitals and nursing note reviewed.  Constitutional:      Comments: Chronically ill and dehydrated  HENT:     Head: Normocephalic.     Nose: Nose normal.     Mouth/Throat:     Mouth: Mucous membranes are dry.  Eyes:     Extraocular Movements: Extraocular movements intact.     Pupils: Pupils are equal, round, and reactive to light.  Cardiovascular:     Rate and Rhythm: Normal rate and regular rhythm.     Pulses: Normal pulses.     Heart sounds: Normal heart sounds.  Pulmonary:     Effort: Pulmonary effort is normal.     Breath sounds: Normal breath sounds.  Abdominal:     General: Abdomen is flat.     Comments: Mild bilateral CVA tenderness  Musculoskeletal:        General: Normal range of motion.     Cervical back: Normal range of motion and neck supple.   Skin:    General: Skin is warm.     Capillary Refill: Capillary refill takes less than 2 seconds.  Neurological:     General: No focal deficit present.     Mental Status: He is oriented to person, place, and time.  Psychiatric:        Mood and Affect: Mood normal.        Behavior: Behavior normal.     ED Results / Procedures / Treatments   Labs (all labs ordered are listed, but only abnormal results are displayed) Labs Reviewed  URINE CULTURE  SARS CORONAVIRUS 2 BY RT PCR  CULTURE, BLOOD (ROUTINE X 2)  CULTURE, BLOOD (ROUTINE  X 2)  CBC WITH DIFFERENTIAL/PLATELET  COMPREHENSIVE METABOLIC PANEL  URINALYSIS, ROUTINE W REFLEX MICROSCOPIC  LACTIC ACID, PLASMA  LACTIC ACID, PLASMA    EKG None  Radiology No results found.  Procedures Procedures    Medications Ordered in ED Medications  sodium chloride 0.9 % bolus 1,000 mL (has no administration in time range)    ED Course/ Medical Decision Making/ A&P                           Medical Decision Making Steve Brown is a 81 y.o. male here presenting with chills and fever and flank pain.  Patient was hypotensive prior to arrival. He meets sepsis criteria so we will do sepsis work-up with CBC and CMP and lactate and cultures and UA and CT renal stone.  Patient has contrast dye allergy so we will do CT without contrast.  9:06 PM I reviewed patient's labs and independently interpreted imaging studies.  White blood cell count is normal.  Lactate is normal.  CT showed no pyelonephritis or stone.  Patient has possible cystitis on CT.  Urinalysis confirmed that he has a UTI.  Patient was given Rocephin.  We will give 5-day course of Keflex for UTI.  COVID test is negative  Problems Addressed: Cystitis: acute illness or injury  Amount and/or Complexity of Data Reviewed Labs: ordered. Decision-making details documented in ED Course. Radiology: ordered and independent interpretation performed. Decision-making details  documented in ED Course.    Final Clinical Impression(s) / ED Diagnoses Final diagnoses:  None    Rx / DC Orders ED Discharge Orders     None         Drenda Freeze, MD 10/06/21 2107

## 2021-10-06 NOTE — ED Triage Notes (Signed)
Pt is present today with c/o chills and body aches. Pt sx started this morning

## 2021-10-06 NOTE — ED Notes (Signed)
Pt was given ginger ale for fluid challenge.

## 2021-10-06 NOTE — ED Triage Notes (Signed)
BIB EMS for hematuria, chills for one week. 90/50 BP, HR 54, O2 98%

## 2021-10-06 NOTE — ED Notes (Signed)
Patient is being discharged from the Urgent Care and sent to the Emergency Department via pov . Per NP, patient is in need of higher level of care due to hypotensive and rectal bleeding. Patient is aware and verbalizes understanding of plan of care.  Vitals:   10/06/21 1439 10/06/21 1509  BP: (!) 98/59 (!) 96/56  Pulse: 98   Resp: 18   Temp: 98.2 F (36.8 C)   SpO2: 95%

## 2021-10-06 NOTE — Discharge Instructions (Signed)
You have a bladder infection.  Take Keflex 3 times daily for 5 days.  Stay hydrated  See your doctor for follow-up  Return to ER if you have worse abdominal pain, vomiting, fever

## 2021-10-06 NOTE — Discharge Instructions (Signed)
Please go to the emergency department as soon as you leave urgent care for further evaluation and management. ?

## 2021-10-06 NOTE — ED Provider Notes (Signed)
[facility]    CSN: [id number] Arrival date & time: 10/06/21  1400      History   Chief Complaint Chief Complaint  Patient presents with   Generalized Body Aches   Chills    HPI Steve Brown is a 81 y.o. male.   Patient presents today with several different chief complaints.  Patient reports that he has had generalized chills and body aches that started upon awakening this morning.  He also reports some runny nose.  Denies any known sick contacts or known fevers at home.  Patient also reports that he has been having some urinary burning and discolored urine.  He states that his urine has been gray in color and has had some blood in his urine at times.  Patient denies abdominal pain but does report that he has had some "pelvic pain".  He states this is baseline though given that he has chronic sciatica.  Patient also reports that he has been having some rectal bleeding but denies rectal pain.  He states that he has been having small amounts of bright blood over the past few days when going to the bathroom.  Denies diarrhea.  Having normal formed bowel movements.  Patient also has lower blood pressure.  Patient reports that he has had some dizziness upon awakening this morning but that he does have intermittent dizziness at baseline.     Past Medical History:  Diagnosis Date   Abnormal prostate biopsy    Anticoagulant long-term use    currently xarelto   BPH with elevated PSA    CKD (chronic kidney disease), stage II    Complication of anesthesia    limted neck rom limited use of left arm due to cva   Coronary artery disease    CARDIOLOGIST-  DR Irish Lack--  2010-- PCI w/ stenting midLAD   DDD (degenerative disc disease), lumbar    Degeneration of cervical intervertebral disc    Depression    Diabetes mellitus without complication (HCC)    Dyspnea on exertion    GERD (gastroesophageal reflux disease)    Hemiparesis due to cerebral infarction    History of  cerebrovascular accident (CVA) with residual deficit 2002 and 2003--  hemiparisis both sides   per MRI  anterior left frontal lobe, left para midline pons, and inferior cerebullam bilaterally infarcts   History of pulmonary embolus (PE)    06-30-2012  extensive bilaterally   History of recurrent TIAs    History of syncope    hx multiple pre-syncope and syncopal episodes due to vasovagal, orthostatic hypotension, dehydration   History of TIAs    several since 2002   Hyperlipidemia    Hypertension    Mild atherosclerosis of carotid artery, bilateral    per last duplex 11-04-2014  bilateral ICA 1--39%   Neuropathy    fingers   OSA on CPAP    followed by dr dohmeier--  sev. osa w/ AHI 65.9   Prostate cancer ([place]) dx 2018   Renal insufficiency    S/P coronary artery stent placement 2010   stenting to mid LAD   Simple renal cyst    bilaterally   Stroke [facility])    Trigger finger of both hands 11-17-13   Type 2 diabetes mellitus ([place]) dx 1986   last one A1c 9.2 on 04-26-2016   Unsteady gait    . Hx prior CVA/TIAs;   Vertebral artery occlusion, left    chronic    Patient Active Problem List  Diagnosis Date Noted   Dizziness on standing 05/18/2021   Vision changes 05/18/2021   Chronic ischemic vertebrobasilar artery cerebellar stroke 05/18/2021   Amaurosis fugax, both eyes 01/18/2021   Transient diplopia 01/18/2021   Hereditary and idiopathic neuropathy, unspecified 11/20/2020   Hyperglycemia due to type 2 diabetes mellitus ([place]) 11/20/2020   Obesity 11/20/2020   Pure hypercholesterolemia 11/20/2020   Syncope 05/26/2020   Back pain 16/10/9602   Acute metabolic encephalopathy 54/09/8117   Lumbar spinal stenosis 08/15/2019   Nonspecific chest pain 08/11/2019   Shortness of breath    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 ([place]) 10/21/2018   Nocturnal hypoxemia 10/21/2018   Radiation therapy complication 14/78/2956   Primary prostate cancer ([place]) 07/31/2017    Anemia 07/31/2017   Vertigo due to cerebrovascular disease 07/31/2017   Poor compliance with CPAP treatment 07/31/2017   Absolute anemia 05/16/2017   Avitaminosis D 05/16/2017   Benign essential HTN 05/16/2017   Benign prostatic hypertrophy without urinary obstruction 05/16/2017   Clinical depression 05/16/2017   CN (constipation) 05/16/2017   Current drug use 05/16/2017   Diabetic neuropathy ([place]) 05/16/2017   Genital herpes 05/16/2017   Infarction of lung due to iatrogenic pulmonary embolism ([place]) 05/16/2017   Sciatica associated with disorder of lumbar spine 05/16/2017   Arteriosclerosis of coronary artery 05/16/2017   Artery disease, cerebral 05/16/2017   Apnea, sleep 05/16/2017   Arthralgia of hip or thigh 05/16/2017   Malignant neoplasm of prostate ([place]) 09/28/2016   Vascular dementia in remission ([place]) 09/28/2016   Remote history of stroke 09/28/2016   Acute kidney injury ([place]) 05/18/2016   Dehydration 05/18/2016   Near syncope 05/18/2016   Orthostatic hypotension 05/18/2016   CKD (chronic kidney disease), stage II 04/26/2016   UTI (urinary tract infection) 04/26/2016   Encounter for counseling on use of CPAP 11/18/2015   Chronic cholecystitis with calculus 11/02/2015   Stroke, 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[place]) 04/22/2015   TIA (transient ischemic attack) 11/03/2014   OSA on CPAP 05/18/2014   White matter disease 08/14/2013   Abnormal x-ray of temporomandibular joint 05/30/2013   Chronic infection of sinus 05/30/2013   Cough 05/30/2013   Fatigue 05/30/2013   Unsteady gait 05/19/2013   Combined fat and carbohydrate induced hyperlipemia 04/24/2013   Syncope 03/20/2013   Angina pectoris ([place]) 10/08/2012   Hypertension    Stroke ([place])    History of TIAs    Lumbago    Other and unspecified hyperlipidemia    Personal history of unspecified circulatory disease    Unwitnessed fall    Pain in joint, multiple sites    Degeneration of cervical intervertebral disc    Unspecified  cardiovascular disease    History of pulmonary embolism: June 2014,  Takes Xarelto 07/01/2012    Class: History of   Hemiparesis ([place]) 07/18/2011   Diabetes mellitus type 2 with complications ([place]) 21/30/8657   CAD in native artery 07/18/2011   History of recurrent TIAs 07/18/2011    Past Surgical History:  Procedure Laterality Date   ANTERIOR CERVICAL DECOMP/DISCECTOMY FUSION  2004   C3 -- C6 limited rom   CARDIAC CATHETERIZATION  06-10-2010   dr Irish Lack   wide patent LAD stent, mid lesion at the origin of the septal prior to the previous stent 40-50%/  normal LVF, ef 55%   CARDIOVASCULAR STRESS TEST  10-23-2012  dr Irish Lack   normal nuclear perfusion study w/ no ischemia/  normal LV function and wall motion , ef 65%   CARPAL TUNNEL RELEASE Bilateral  CATARACT EXTRACTION W/ INTRAOCULAR LENS  IMPLANT, BILATERAL     CHOLECYSTECTOMY N/A 11/02/2015   Procedure: LAPAROSCOPIC CHOLECYSTECTOMY WITH INTRAOPERATIVE CHOLANGIOGRAM;  Surgeon: Donnie Mesa, MD;  Location: [facility];  Service: General;  Laterality: N/A;   COLONOSCOPY     CORONARY ANGIOPLASTY WITH STENT PLACEMENT  02/2008   stenting to mid LAD   GOLD SEED IMPLANT N/A 11/15/2016   Procedure: GOLD SEED IMPLANT [place];  Surgeon: Ardis Hughs, MD;  Location[facility];  Service: Urology;  Laterality: N/A;   IR ANGIO INTRA EXTRACRAN SEL COM CAROTID INNOMINATE BILAT MOD SED  06/13/2018   IR ANGIO VERTEBRAL SEL VERTEBRAL UNI R MOD SED  06/13/2018   IR US GUIDE VASC ACCESS RIGHT  06/13/2018   LEFT HEART CATH AND CORONARY ANGIOGRAPHY N/A 05/25/2017   Procedure: LEFT HEART CATH AND CORONARY ANGIOGRAPHY;  Surgeon: Jettie Booze, MD;  [facility];  Service: Cardiovascular;  Laterality: N/A;   LEFT HEART CATHETERIZATION WITH CORONARY ANGIOGRAM N/A 04/03/2013   Procedure: LEFT HEART CATHETERIZATION WITH CORONARY ANGIOGRAM;  Surgeon: Jettie Booze, MD;  Location: [facility] CATH LAB;  Service:  Cardiovascular;  Laterality: N/A;  patent mLAD stent  w/ mild disease in remainder LAD and its branches;  mod. focal lesion midLCFx- FFR of lesion was negative for ischemia/  normal LVSF, ef 50%   lungs  2005   "fluid pumped off lungs"   NEUROPLASTY / TRANSPOSITION ULNAR NERVE AT ELBOW Right 2004   PROSTATE BIOPSY N/A 08/31/2016   Procedure: PROSTATE  BIOPSY TRANSRECTAL ULTRASONIC PROSTATE (TUBP);  Surgeon: Ardis Hughs, MD;  Location: [facility];  Service: Urology;  Laterality: N/A;   SPACE OAR INSTILLATION N/A 11/15/2016   Procedure: SPACE OAR INSTILLATION;  Surgeon: Ardis Hughs, MD;  Location: [facility];  Service: Urology;  Laterality: N/A;   TRANSTHORACIC ECHOCARDIOGRAM  04/27/2016   severe focal basal LVH, ef 60-65%,  grade 2 diastoilc dysfunction/  mild AR, MR, and TR/  atrial septum lipomatous hypertrophy/  PASP [id number]   UMBILICAL HERNIA REPAIR         Home Medications    Prior to Admission medications   Medication Sig Start Date End Date Taking? Authorizing Provider  acetaminophen-codeine (TYLENOL #3) 300-30 MG tablet as needed for pain.    [provider]  albuterol (VENTOLIN HFA) 108 (90 Base) MCG/ACT inhaler Inhale 1-2 puffs into the lungs every 6 (six) hours as needed for wheezing or shortness of breath. 09/06/20   Ward, Lenise Arena, PA-C  bisacodyl (DULCOLAX) 10 MG suppository Place 1 suppository (10 mg total) rectally daily as needed for moderate constipation. 05/31/20   Antonieta Pert, MD  chlorhexidine (PERIDEX) 0.12 % solution as needed. soreness    [provider]  Cholecalciferol (VITAMIN D-1000 MAX ST) 25 MCG (1000 UT) tablet Take by mouth daily.    [provider]  cholestyramine light (PREVALITE) 4 g packet Take 1 packet by mouth as needed for constipation. 05/23/21   [provider]  clobetasol cream (TEMOVATE) 0.05 % Apply topically as needed for rash. 05/31/21   [provider]   diclofenac Sodium (VOLTAREN) 1 % GEL Apply 4 g topically 4 (four) times daily. 09/03/19   Deno Etienne, DO  fluticasone (FLONASE) 50 MCG/ACT nasal spray Place 1 spray into both nostrils daily as needed for allergies. 06/13/20   Medina-Vargas, Monina C, NP  furosemide (LASIX) 20 MG tablet Take 1-2 tablets (20-40 mg total) by mouth  daily as needed for fluid. 06/13/20   Medina-Vargas, Monina C, NP  hydrochlorothiazide (MICROZIDE) 12.5 MG capsule Take 1 capsule (12.5 mg total) by mouth daily. 06/13/20   Medina-Vargas, Monina C, NP  HYDROcodone-acetaminophen (NORCO/VICODIN) 5-325 MG tablet Take 1 tablet by mouth every 4 (four) hours as needed. 08/13/21   Tacy Learn, PA-C  hydrOXYzine (VISTARIL) 25 MG capsule as needed for anxiety.    [provider]  insulin aspart (NOVOLOG) 100 UNIT/ML FlexPen Inject 5 Units into the skin 3 (three) times daily with meals. 06/13/20   Medina-Vargas, Monina C, NP  isosorbide mononitrate (IMDUR) 30 MG 24 hr tablet Take 30 mg by mouth daily. 05/31/21   [provider]  losartan (COZAAR) 100 MG tablet Take 1 tablet (100 mg total) by mouth daily. Patient taking differently: Take 50 mg by mouth daily. 06/13/20   Medina-Vargas, Monina C, NP  meclizine (ANTIVERT) 12.5 MG tablet as needed for dizziness. 04/25/21   [provider]  methocarbamol (ROBAXIN) 500 MG tablet as needed for pain.    [provider]  modafinil (PROVIGIL) 100 MG tablet TAKE 1 TABLET BY MOUTH EVERY DAY 06/29/21   Dohmeier, Asencion Partridge, MD  nitroGLYCERIN (NITROSTAT) 0.4 MG SL tablet as needed.    [provider]  nystatin (MYCOSTATIN/NYSTOP) powder as needed for pain.    [provider]  nystatin cream (MYCOSTATIN) Apply topically as needed for rash. 06/15/21   [provider]  ondansetron (ZOFRAN) 4 MG tablet as needed for nausea/vomiting.    [provider]  OZEMPIC, 0.25 OR 0.5 MG/DOSE, 2 MG/3ML SOPN once a week. 06/11/21   [provider]   pantoprazole (PROTONIX) 40 MG tablet Take 1 tablet (40 mg total) by mouth every morning. 06/13/20   Medina-Vargas, Monina C, NP  polyethylene glycol (MIRALAX / GLYCOLAX) 17 g packet Take 17 g by mouth daily. 05/31/20   Antonieta Pert, MD  pregabalin (LYRICA) 300 MG capsule Take 300 mg by mouth 2 (two) times daily. 04/26/20   [provider]  rosuvastatin (CRESTOR) 20 MG tablet Take 1 tablet (20 mg total) by mouth daily. 11/29/20   Jettie Booze, MD  sitaGLIPtin (JANUVIA) 50 MG tablet Take 1 tablet (50 mg total) by mouth daily. 06/13/20   Medina-Vargas, Monina C, NP  TRESIBA FLEXTOUCH 100 UNIT/ML FlexTouch Pen Inject 34 Units into the skin daily. 06/13/20   Medina-Vargas, Monina C, NP  valACYclovir (VALTREX) 500 MG tablet Take 500 mg by mouth daily. 01/25/21   [provider]  XARELTO 20 MG TABS tablet Take 1 tablet (20 mg total) by mouth daily. 06/13/20   Medina-Vargas, Senaida Lange, NP    Family History Family History  Problem Relation Age of Onset   Aneurysm Mother    Cancer Father        unknown either pancreatic or prostate   Dementia Sister    Stroke Brother    Heart attack Neg Hx    Sleep apnea Neg Hx     Social History Social History   Tobacco Use   Smoking status: Never   Smokeless tobacco: Never  Vaping Use   Vaping Use: Never used  Substance Use Topics   Alcohol use: Not Currently   Drug use: Never     Allergies   Other, Phenergan [promethazine], Iohexol, Iohexol, Phenergan [promethazine], and Tramadol   Review of Systems Review of Systems Per HPI  Physical Exam Triage Vital Signs ED Triage Vitals  Enc Vitals Group     BP  10/06/21 1439 (!) 98/59     Pulse Rate 10/06/21 1439 98     Resp 10/06/21 1439 18     Temp 10/06/21 1439 98.2 F (36.8 C)     Temp src --      SpO2 10/06/21 1439 95 %     Weight --      Height --      Head Circumference --      Peak Flow --      Pain Score 10/06/21 1438 5     Pain Loc --      Pain Edu? --      Excl. in  [place]? --    No data found.  Updated Vital Signs BP (!) 96/56   Pulse 98   Temp 98.2 F (36.8 C)   Resp 18   SpO2 95%   Visual Acuity Right Eye Distance:   Left Eye Distance:   Bilateral Distance:    Right Eye Near:   Left Eye Near:    Bilateral Near:     Physical Exam Constitutional:      General: He is not in acute distress.    Appearance: Normal appearance. He is not toxic-appearing or diaphoretic.  HENT:     Head: Normocephalic and atraumatic.     Right Ear: Tympanic membrane and ear canal normal.     Left Ear: Tympanic membrane and ear canal normal.     Nose: Nose normal.     Mouth/Throat:     Mouth: Mucous membranes are moist.     Pharynx: No posterior oropharyngeal erythema.  Eyes:     Extraocular Movements: Extraocular movements intact.     Conjunctiva/sclera: Conjunctivae normal.     Pupils: Pupils are equal, round, and reactive to light.  Cardiovascular:     Rate and Rhythm: Normal rate and regular rhythm.     Pulses: Normal pulses.     Heart sounds: Normal heart sounds.  Pulmonary:     Effort: Pulmonary effort is normal. No respiratory distress.     Breath sounds: Normal breath sounds.  Abdominal:     General: Bowel sounds are normal. There is no distension.     Palpations: Abdomen is soft.     Tenderness: There is no abdominal tenderness.  Neurological:     General: No focal deficit present.     Mental Status: He is alert and oriented to person, place, and time. Mental status is at baseline.     Cranial Nerves: Cranial nerves 2-12 are intact.     Sensory: Sensation is intact.     Motor: Motor function is intact.     Coordination: Coordination is intact.     Gait: Gait is intact.  Psychiatric:        Mood and Affect: Mood normal.        Behavior: Behavior normal.        Thought Content: Thought content normal.        Judgment: Judgment normal.      UC Treatments / Results  Labs (all labs ordered are listed, but only abnormal results are  displayed) Labs Reviewed - No data to display  EKG   Radiology No results found.  Procedures Procedures (including critical care time)  Medications Ordered in UC Medications - No data to display  Initial Impression / Assessment and Plan / UC Course  I have reviewed the triage vital signs and the nursing notes.  Pertinent labs & imaging results that were available during my  care of the patient were reviewed by me and considered in my medical decision making (see chart for details).     Patient has multiple different associated worrisome symptoms including rectal bleeding, hypotension, discolored urine, generalized body aches.  Blood pressure retake was similar.  After further review of patient's chart, this is does not appear baseline for patient.  Patient also symptomatic with dizziness.  I am concerned the patient needs a more extensive evaluation and management than can provided here in urgent care given limited resources.  Patient was advised to go to the emergency department for further evaluation and management.  Patient was agreeable with plan.  Suggested EMS transport but patient declined.  Risks associated with not going by EMS was discussed with patient.  Patient voiced understanding.  Patient wished to self transport.  Patient stated that "I want to leave my car at home, so I will go home and call the ambulance".  Given the patient appears stable, I do think this is reasonable.  Patient was advised of importance of going to the emergency department and he voiced understanding. Final Clinical Impressions(s) / UC Diagnoses   Final diagnoses:  Generalized body aches  Rectal bleeding  Chills  Hypotension, unspecified hypotension type  Dysuria     Discharge Instructions      Please go to the emergency department as soon as you leave urgent care for further evaluation and management.     ED Prescriptions   None    PDMP not reviewed this encounter.   Teodora Medici,  [place] 10/06/21 1520

## 2021-10-09 LAB — URINE CULTURE: Culture: >=100000 — AB

## 2021-10-10 ENCOUNTER — Telehealth (HOSPITAL_BASED_OUTPATIENT_CLINIC_OR_DEPARTMENT_OTHER): Payer: Self-pay | Admitting: Emergency Medicine

## 2021-10-10 DIAGNOSIS — E1142 Type 2 diabetes mellitus with diabetic polyneuropathy: Secondary | ICD-10-CM | POA: Diagnosis not present

## 2021-10-10 DIAGNOSIS — R2681 Unsteadiness on feet: Secondary | ICD-10-CM | POA: Diagnosis not present

## 2021-10-10 DIAGNOSIS — E1151 Type 2 diabetes mellitus with diabetic peripheral angiopathy without gangrene: Secondary | ICD-10-CM | POA: Diagnosis not present

## 2021-10-10 DIAGNOSIS — I739 Peripheral vascular disease, unspecified: Secondary | ICD-10-CM | POA: Diagnosis not present

## 2021-10-10 DIAGNOSIS — L603 Nail dystrophy: Secondary | ICD-10-CM | POA: Diagnosis not present

## 2021-10-10 DIAGNOSIS — R270 Ataxia, unspecified: Secondary | ICD-10-CM | POA: Diagnosis not present

## 2021-10-10 NOTE — Progress Notes (Signed)
ED Antimicrobial Stewardship Positive Culture Follow Up   Steve Brown is an 81 y.o. male who presented to [facility] on 10/06/2021 with a chief complaint of  Chief Complaint  Patient presents with   Hematuria    Recent Results (from the past 720 hour(s))  SARS Coronavirus 2 by RT PCR (hospital order, performed in [facility] hospital lab) *cepheid single result test* Anterior Nasal Swab     Status: None   Collection Time: 10/06/21  5:35 PM   Specimen: Anterior Nasal Swab  Result Value Ref Range Status   SARS Coronavirus 2 by RT PCR NEGATIVE NEGATIVE Final    Comment: (NOTE) SARS-CoV-2 target nucleic acids are NOT DETECTED.  The SARS-CoV-2 RNA is generally detectable in upper and lower respiratory specimens during the acute phase of infection. The lowest concentration of SARS-CoV-2 viral copies this assay can detect is 250 copies / mL. A negative result does not preclude SARS-CoV-2 infection and should not be used as the sole basis for treatment or other patient management decisions.  A negative result may occur with improper specimen collection / handling, submission of specimen other than nasopharyngeal swab, presence of viral mutation(s) within the areas targeted by this assay, and inadequate number of viral copies (<250 copies / mL). A negative result must be combined with clinical observations, patient history, and epidemiological information.  Fact Sheet for Patients:   https://www.patel.info/  Fact Sheet for Healthcare Providers: https://hall.com/  This test is not yet approved or  cleared by the [place] FDA and has been authorized for detection and/or diagnosis of SARS-CoV-2 by FDA under an Emergency Use Authorization (EUA).  This EUA will remain in effect (meaning this test can be used) for the duration of the COVID-19 declaration under Section 564(b)(1) of the Act, 21 U.S.C. section 360bbb-3(b)(1), unless the  authorization is terminated or revoked sooner.  Performed at [facility], [place] [address], [place], [place] [address]   Blood culture (routine x 2)     Status: None (Preliminary result)   Collection Time: 10/06/21  5:35 PM   Specimen: BLOOD  Result Value Ref Range Status   Specimen Description   Final    BLOOD BLOOD RIGHT HAND Performed at [facility] [address] [place], [place] [address]    Special Requests   Final    BOTTLES DRAWN AEROBIC AND ANAEROBIC Blood Culture adequate volume Performed at [facility] [address] [place], [place] [address]    Culture   Final    NO GROWTH 4 DAYS Performed at [facility], [place] [address], [place], [place] [address]    Report Status PENDING  Incomplete  Blood culture (routine x 2)     Status: None (Preliminary result)   Collection Time: 10/06/21  6:00 PM   Specimen: BLOOD  Result Value Ref Range Status   Specimen Description   Final    BLOOD RIGHT ANTECUBITAL Performed at [facility] [address], [place], [place] [address]    Special Requests   Final    BOTTLES DRAWN AEROBIC AND ANAEROBIC Blood Culture results may not be optimal due to an excessive volume of blood received in culture bottles Performed at [facility] [place] [address] [place], [place] [address]    Culture   Final    NO GROWTH 4 DAYS Performed at [facility], [place] [address], [place], [place] [address]    Report Status PENDING  Incomplete  Urine Culture     Status: Abnormal   Collection Time: 10/06/21  7:06 PM   Specimen: Urine, Clean Catch  Result Value Ref Range Status   Specimen Description   Final    URINE, CLEAN CATCH Performed at [facility], [place] [address], [place], [place] [address]    Special Requests   Final    NONE Performed at [facility], [place] [address], [place], [place] [address]     Culture (A)  Final    >=100,000 COLONIES/mL KLEBSIELLA PNEUMONIAE 80,000 COLONIES/mL KLEBSIELLA PNEUMONIAE    Report Status 10/09/2021 FINAL  Final   Organism ID, Bacteria KLEBSIELLA PNEUMONIAE (A)  Final   Organism ID, Bacteria KLEBSIELLA PNEUMONIAE (A)  Final      Susceptibility   Klebsiella pneumoniae - MIC*    AMPICILLIN >=32 RESISTANT Resistant     CEFAZOLIN RESISTANT Resistant     CEFEPIME <=0.12 SENSITIVE Sensitive     CEFTRIAXONE 0.5 SENSITIVE Sensitive     CIPROFLOXACIN <=0.25 SENSITIVE Sensitive     GENTAMICIN <=1 SENSITIVE Sensitive     IMIPENEM <=0.25 SENSITIVE Sensitive     NITROFURANTOIN 32 SENSITIVE Sensitive     TRIMETH/SULFA <=20 SENSITIVE Sensitive     AMPICILLIN/SULBACTAM 8 SENSITIVE Sensitive     PIP/TAZO <=4 SENSITIVE Sensitive    Klebsiella pneumoniae - MIC*    AMPICILLIN >=32 RESISTANT Resistant     CEFAZOLIN <=4 SENSITIVE Sensitive     CEFEPIME <=0.12 SENSITIVE Sensitive     CEFTRIAXONE 0.5 SENSITIVE Sensitive     CIPROFLOXACIN <=0.25 SENSITIVE Sensitive     GENTAMICIN <=1 SENSITIVE Sensitive     IMIPENEM <=0.25 SENSITIVE Sensitive     NITROFURANTOIN 128 RESISTANT Resistant     TRIMETH/SULFA <=20 SENSITIVE Sensitive     AMPICILLIN/SULBACTAM 16 INTERMEDIATE Intermediate     PIP/TAZO <=4 SENSITIVE Sensitive     * >=100,000 COLONIES/mL KLEBSIELLA PNEUMONIAE    80,000 COLONIES/mL KLEBSIELLA PNEUMONIAE    '[x]'$  Treated with cephalexin, organism resistant to prescribed antimicrobial '[]'$  Patient discharged originally without antimicrobial agent and treatment is now indicated  New antibiotic prescription: Ciprofloxacin 250 mg BID X 5 days   ED Provider: Suella Broad, PA-C    Jimmy Footman, PharmD, BCPS, BCIDP Infectious Diseases Clinical Pharmacist Phone: [phone] 10/10/2021, 10:43 AM

## 2021-10-10 NOTE — Telephone Encounter (Signed)
Post ED Visit - Positive Culture Follow-up: Successful Patient Follow-Up  Culture assessed and recommendations reviewed by:  '[]'$  Elenor Quinones, Pharm.D. '[]'$  Heide Guile, Pharm.D., BCPS AQ-ID '[]'$  Parks Neptune, Pharm.D., BCPS '[]'$  Alycia Rossetti, Pharm.D., BCPS '[]'$  [place], Pharm.D., BCPS, AAHIVP '[]'$  Legrand Como, Pharm.D., BCPS, AAHIVP '[]'$  Salome Arnt, PharmD, BCPS '[]'$  Johnnette Gourd, PharmD, BCPS '[]'$  Hughes Better, PharmD, BCPS '[]'$  Leeroy Cha, PharmD Jimmy Footman PharmD  Positive urine culture  '[]'$  Patient discharged without antimicrobial prescription and treatment is now indicated '[x]'$  Organism is resistant to prescribed ED discharge antimicrobial '[]'$  Patient with positive blood cultures  Changes discussed with ED provider: Suella Broad [facility] New antibiotic prescription stop cephalexin, start ciprofloxacin '250mg'$  po bid x 5 days Attempting to contact patient    Hazle Nordmann 10/10/2021, 11:21 AM

## 2021-10-11 LAB — CULTURE, BLOOD (ROUTINE X 2)
Culture: NO GROWTH
Culture: NO GROWTH
Special Requests: ADEQUATE

## 2021-10-12 DIAGNOSIS — R270 Ataxia, unspecified: Secondary | ICD-10-CM | POA: Diagnosis not present

## 2021-10-12 DIAGNOSIS — R2681 Unsteadiness on feet: Secondary | ICD-10-CM | POA: Diagnosis not present

## 2021-10-19 DIAGNOSIS — R2681 Unsteadiness on feet: Secondary | ICD-10-CM | POA: Diagnosis not present

## 2021-10-19 DIAGNOSIS — R270 Ataxia, unspecified: Secondary | ICD-10-CM | POA: Diagnosis not present

## 2021-10-21 DIAGNOSIS — R270 Ataxia, unspecified: Secondary | ICD-10-CM | POA: Diagnosis not present

## 2021-10-21 DIAGNOSIS — R2681 Unsteadiness on feet: Secondary | ICD-10-CM | POA: Diagnosis not present

## 2021-10-24 DIAGNOSIS — R270 Ataxia, unspecified: Secondary | ICD-10-CM | POA: Diagnosis not present

## 2021-10-24 DIAGNOSIS — R2681 Unsteadiness on feet: Secondary | ICD-10-CM | POA: Diagnosis not present

## 2021-11-02 ENCOUNTER — Other Ambulatory Visit: Payer: Self-pay | Admitting: Interventional Cardiology

## 2021-11-07 DIAGNOSIS — Z8546 Personal history of malignant neoplasm of prostate: Secondary | ICD-10-CM | POA: Diagnosis not present

## 2021-11-07 DIAGNOSIS — R310 Gross hematuria: Secondary | ICD-10-CM | POA: Diagnosis not present

## 2021-11-09 DIAGNOSIS — R270 Ataxia, unspecified: Secondary | ICD-10-CM | POA: Diagnosis not present

## 2021-11-09 DIAGNOSIS — R2681 Unsteadiness on feet: Secondary | ICD-10-CM | POA: Diagnosis not present

## 2021-11-11 DIAGNOSIS — R2681 Unsteadiness on feet: Secondary | ICD-10-CM | POA: Diagnosis not present

## 2021-11-11 DIAGNOSIS — R270 Ataxia, unspecified: Secondary | ICD-10-CM | POA: Diagnosis not present

## 2021-11-14 DIAGNOSIS — R2681 Unsteadiness on feet: Secondary | ICD-10-CM | POA: Diagnosis not present

## 2021-11-14 DIAGNOSIS — R270 Ataxia, unspecified: Secondary | ICD-10-CM | POA: Diagnosis not present

## 2021-11-16 DIAGNOSIS — R2681 Unsteadiness on feet: Secondary | ICD-10-CM | POA: Diagnosis not present

## 2021-11-16 DIAGNOSIS — R270 Ataxia, unspecified: Secondary | ICD-10-CM | POA: Diagnosis not present

## 2021-11-19 ENCOUNTER — Other Ambulatory Visit: Payer: Self-pay

## 2021-11-19 ENCOUNTER — Emergency Department (HOSPITAL_BASED_OUTPATIENT_CLINIC_OR_DEPARTMENT_OTHER)
Admission: EM | Admit: 2021-11-19 | Discharge: 2021-11-19 | Disposition: A | Discharge status: Home / Self Care | Payer: Medicare Other | Attending: Emergency Medicine | Admitting: Emergency Medicine

## 2021-11-19 ENCOUNTER — Encounter (HOSPITAL_BASED_OUTPATIENT_CLINIC_OR_DEPARTMENT_OTHER): Payer: Self-pay | Admitting: Emergency Medicine

## 2021-11-19 DIAGNOSIS — N182 Chronic kidney disease, stage 2 (mild): Secondary | ICD-10-CM | POA: Diagnosis not present

## 2021-11-19 DIAGNOSIS — T68XXXA Hypothermia, initial encounter: Secondary | ICD-10-CM | POA: Diagnosis not present

## 2021-11-19 DIAGNOSIS — M5442 Lumbago with sciatica, left side: Secondary | ICD-10-CM | POA: Diagnosis not present

## 2021-11-19 DIAGNOSIS — E1122 Type 2 diabetes mellitus with diabetic chronic kidney disease: Secondary | ICD-10-CM | POA: Diagnosis not present

## 2021-11-19 DIAGNOSIS — I2510 Atherosclerotic heart disease of native coronary artery without angina pectoris: Secondary | ICD-10-CM | POA: Insufficient documentation

## 2021-11-19 DIAGNOSIS — Z794 Long term (current) use of insulin: Secondary | ICD-10-CM | POA: Diagnosis not present

## 2021-11-19 DIAGNOSIS — I129 Hypertensive chronic kidney disease with stage 1 through stage 4 chronic kidney disease, or unspecified chronic kidney disease: Secondary | ICD-10-CM | POA: Insufficient documentation

## 2021-11-19 DIAGNOSIS — Z7901 Long term (current) use of anticoagulants: Secondary | ICD-10-CM | POA: Insufficient documentation

## 2021-11-19 DIAGNOSIS — N200 Calculus of kidney: Secondary | ICD-10-CM | POA: Insufficient documentation

## 2021-11-19 DIAGNOSIS — N12 Tubulo-interstitial nephritis, not specified as acute or chronic: Secondary | ICD-10-CM | POA: Diagnosis not present

## 2021-11-19 DIAGNOSIS — I959 Hypotension, unspecified: Secondary | ICD-10-CM | POA: Diagnosis not present

## 2021-11-19 DIAGNOSIS — Z79899 Other long term (current) drug therapy: Secondary | ICD-10-CM | POA: Diagnosis not present

## 2021-11-19 DIAGNOSIS — Z7984 Long term (current) use of oral hypoglycemic drugs: Secondary | ICD-10-CM | POA: Diagnosis not present

## 2021-11-19 DIAGNOSIS — M549 Dorsalgia, unspecified: Secondary | ICD-10-CM | POA: Diagnosis not present

## 2021-11-19 LAB — URINALYSIS, ROUTINE W REFLEX MICROSCOPIC
Bilirubin Urine: NEGATIVE
Glucose, UA: NEGATIVE mg/dL
Ketones, ur: NEGATIVE mg/dL
Nitrite: NEGATIVE
Protein, ur: 100 mg/dL — AB
Specific Gravity, Urine: 1.015 (ref 1.005–1.030)
pH: 5.5 (ref 5.0–8.0)

## 2021-11-19 LAB — URINALYSIS, MICROSCOPIC (REFLEX): WBC, UA: >50 WBC/hpf (ref 0–5)

## 2021-11-19 MED ORDER — CEFPODOXIME PROXETIL 200 MG PO TABS: 200.0000 mg | ORAL_TABLET | Freq: Two times a day (BID) | ORAL | 0 refills | Status: AC

## 2021-11-19 MED ORDER — HYDROCODONE-ACETAMINOPHEN 5-325 MG PO TABS: 1.0000 | ORAL_TABLET | ORAL | 0 refills | Status: DC | PRN

## 2021-11-19 NOTE — ED Triage Notes (Addendum)
Pt BIB [facility] with c/o lower back pain x 3 days, no relief with otc meds. Hx of sciatica. Endorses dysuria and frequency. Pt walking with steady gait, reports lower back pain

## 2021-11-19 NOTE — ED Provider Notes (Signed)
[place] EMERGENCY DEPARTMENT Provider Note   CSN: [id number] Arrival date & time: 11/19/21  1659     History {Add pertinent medical, surgical, social history, OB history to HPI:1} Chief Complaint  Patient presents with   Back Pain    Axcel Horsch is a 81 y.o. male.  HPI     Back pain Has back pain back there all the time. Excruciating pain, couldn't get out of bed until 1PM today  Stayed in lower back, would go up Goes down to groin No falls or trauma No numbness or weakness No loss of control of bowel or bladder No difficulty urinating A little bit of dysuria, no frequency No nausea or vomiting, does have some flank pain No fever or chills Phas appointment on Monday with back dr Use rubbing etoh/voltaren gel 3-4 mos ago saw back dr nad had injections     Past Medical History:  Diagnosis Date   Abnormal prostate biopsy    Anticoagulant long-term use    currently xarelto   BPH with elevated PSA    CKD (chronic kidney disease), stage II    Complication of anesthesia    limted neck rom limited use of left arm due to cva   Coronary artery disease    CARDIOLOGIST-  DR Irish Lack--  2010-- PCI w/ stenting midLAD   DDD (degenerative disc disease), lumbar    Degeneration of cervical intervertebral disc    Depression    Diabetes mellitus without complication (HCC)    Dyspnea on exertion    GERD (gastroesophageal reflux disease)    Hemiparesis due to cerebral infarction    History of cerebrovascular accident (CVA) with residual deficit 2002 and 2003--  hemiparisis both sides   per MRI  anterior left frontal lobe, left para midline pons, and inferior cerebullam bilaterally infarcts   History of pulmonary embolus (PE)    06-30-2012  extensive bilaterally   History of recurrent TIAs    History of syncope    hx multiple pre-syncope and syncopal episodes due to vasovagal, orthostatic hypotension, dehydration   History of TIAs    several since 2002    Hyperlipidemia    Hypertension    Mild atherosclerosis of carotid artery, bilateral    per last duplex 11-04-2014  bilateral ICA 1--39%   Neuropathy    fingers   OSA on CPAP    followed by dr dohmeier--  sev. osa w/ AHI 65.9   Prostate cancer ([place]) dx 2018   Renal insufficiency    S/P coronary artery stent placement 2010   stenting to mid LAD   Simple renal cyst    bilaterally   Stroke [facility])    Trigger finger of both hands 11-17-13   Type 2 diabetes mellitus ([place]) dx 1986   last one A1c 9.2 on 04-26-2016   Unsteady gait    . Hx prior CVA/TIAs;   Vertebral artery occlusion, left    chronic    Home Medications Prior to Admission medications   Medication Sig Start Date End Date Taking? Authorizing Provider  acetaminophen-codeine (TYLENOL #3) 300-30 MG tablet as needed for pain.    [provider]  albuterol (VENTOLIN HFA) 108 (90 Base) MCG/ACT inhaler Inhale 1-2 puffs into the lungs every 6 (six) hours as needed for wheezing or shortness of breath. 09/06/20   Ward, Lenise Arena, PA-C  bisacodyl (DULCOLAX) 10 MG suppository Place 1 suppository (10 mg total) rectally daily as needed for moderate constipation. 05/31/20   Antonieta Pert, MD  cephALEXin (KEFLEX) 500 MG capsule Take 1 capsule (500 mg total) by mouth 3 (three) times daily. 10/06/21   Drenda Freeze, MD  chlorhexidine (PERIDEX) 0.12 % solution as needed. soreness    [provider]  Cholecalciferol (VITAMIN D-1000 MAX ST) 25 MCG (1000 UT) tablet Take by mouth daily.    [provider]  cholestyramine light (PREVALITE) 4 g packet Take 1 packet by mouth as needed for constipation. 05/23/21   [provider]  clobetasol cream (TEMOVATE) 0.05 % Apply topically as needed for rash. 05/31/21   [provider]  diclofenac Sodium (VOLTAREN) 1 % GEL Apply 4 g topically 4 (four) times daily. 09/03/19   Deno Etienne, DO  fluticasone (FLONASE) 50 MCG/ACT nasal spray Place 1 spray into both nostrils daily  as needed for allergies. 06/13/20   Medina-Vargas, Monina C, NP  furosemide (LASIX) 20 MG tablet Take 1-2 tablets (20-40 mg total) by mouth daily as needed for fluid. 06/13/20   Medina-Vargas, Monina C, NP  hydrochlorothiazide (MICROZIDE) 12.5 MG capsule Take 1 capsule (12.5 mg total) by mouth daily. 06/13/20   Medina-Vargas, Monina C, NP  HYDROcodone-acetaminophen (NORCO/VICODIN) 5-325 MG tablet Take 1 tablet by mouth every 4 (four) hours as needed. 08/13/21   Tacy Learn, PA-C  hydrOXYzine (VISTARIL) 25 MG capsule as needed for anxiety.    [provider]  insulin aspart (NOVOLOG) 100 UNIT/ML FlexPen Inject 5 Units into the skin 3 (three) times daily with meals. 06/13/20   Medina-Vargas, Monina C, NP  isosorbide mononitrate (IMDUR) 30 MG 24 hr tablet Take 30 mg by mouth daily. 05/31/21   [provider]  losartan (COZAAR) 100 MG tablet Take 1 tablet (100 mg total) by mouth daily. Patient taking differently: Take 50 mg by mouth daily. 06/13/20   Medina-Vargas, Monina C, NP  meclizine (ANTIVERT) 12.5 MG tablet as needed for dizziness. 04/25/21   [provider]  methocarbamol (ROBAXIN) 500 MG tablet as needed for pain.    [provider]  modafinil (PROVIGIL) 100 MG tablet TAKE 1 TABLET BY MOUTH EVERY DAY 06/29/21   Dohmeier, Asencion Partridge, MD  nitroGLYCERIN (NITROSTAT) 0.4 MG SL tablet as needed.    [provider]  nystatin (MYCOSTATIN/NYSTOP) powder as needed for pain.    [provider]  nystatin cream (MYCOSTATIN) Apply topically as needed for rash. 06/15/21   [provider]  ondansetron (ZOFRAN) 4 MG tablet as needed for nausea/vomiting.    [provider]  OZEMPIC, 0.25 OR 0.5 MG/DOSE, 2 MG/3ML SOPN once a week. 06/11/21   [provider]  pantoprazole (PROTONIX) 40 MG tablet Take 1 tablet (40 mg total) by mouth every morning. 06/13/20   Medina-Vargas, Monina C, NP  polyethylene glycol (MIRALAX / GLYCOLAX) 17 g packet Take 17 g by  mouth daily. 05/31/20   Antonieta Pert, MD  pregabalin (LYRICA) 300 MG capsule Take 300 mg by mouth 2 (two) times daily. 04/26/20   [provider]  rosuvastatin (CRESTOR) 20 MG tablet TAKE 1 TABLET BY MOUTH EVERY DAY 11/02/21   Jettie Booze, MD  sitaGLIPtin (JANUVIA) 50 MG tablet Take 1 tablet (50 mg total) by mouth daily. 06/13/20   Medina-Vargas, Monina C, NP  TRESIBA FLEXTOUCH 100 UNIT/ML FlexTouch Pen Inject 34 Units into the skin daily. 06/13/20   Medina-Vargas, Monina C, NP  valACYclovir (VALTREX) 500 MG tablet Take 500 mg by mouth daily. 01/25/21   [provider]  XARELTO 20 MG TABS tablet Take 1 tablet (20  mg total) by mouth daily. 06/13/20   Medina-Vargas, Monina C, NP      Allergies    Other, Phenergan [promethazine], Iohexol, Iohexol, Phenergan [promethazine], and Tramadol    Review of Systems   Review of Systems  Physical Exam Updated Vital Signs BP 103/64 (BP Location: Right Arm)   Pulse 65   Temp 98.7 F (37.1 C) (Oral)   Resp 18   Ht '5\' 5"'$  (1.651 m)   Wt 82.1 kg   SpO2 97%   BMI 30.12 kg/m  Physical Exam  ED Results / Procedures / Treatments   Labs (all labs ordered are listed, but only abnormal results are displayed) Labs Reviewed  URINALYSIS, ROUTINE W REFLEX MICROSCOPIC - Abnormal; Notable for the following components:      Result Value   APPearance CLOUDY (*)    Hgb urine dipstick MODERATE (*)    Protein, ur 100 (*)    Leukocytes,Ua MODERATE (*)    All other components within normal limits  URINALYSIS, MICROSCOPIC (REFLEX) - Abnormal; Notable for the following components:   Bacteria, UA MANY (*)    All other components within normal limits    EKG None  Radiology No results found.  Procedures Procedures  {Document cardiac monitor, telemetry assessment procedure when appropriate:1}  Medications Ordered in ED Medications - No data to display  ED Course/ Medical Decision Making/ A&P                           Medical Decision  Making  ***  {Document critical care time when appropriate:1} {Document review of labs and clinical decision tools ie heart score, Chads2Vasc2 etc:1}  {Document your independent review of radiology images, and any outside records:1} {Document your discussion with family members, caretakers, and with consultants:1} {Document social determinants of health affecting pt's care:1} {Document your decision making why or why not admission, treatments were needed:1} Final Clinical Impression(s) / ED Diagnoses Final diagnoses:  None    Rx / DC Orders ED Discharge Orders     None

## 2021-11-21 DIAGNOSIS — M545 Low back pain, unspecified: Secondary | ICD-10-CM | POA: Diagnosis not present

## 2021-11-21 DIAGNOSIS — R2681 Unsteadiness on feet: Secondary | ICD-10-CM | POA: Diagnosis not present

## 2021-11-21 DIAGNOSIS — M5116 Intervertebral disc disorders with radiculopathy, lumbar region: Secondary | ICD-10-CM | POA: Diagnosis not present

## 2021-11-21 DIAGNOSIS — R270 Ataxia, unspecified: Secondary | ICD-10-CM | POA: Diagnosis not present

## 2021-11-22 LAB — URINE CULTURE: Culture: >=100000 — AB

## 2021-11-23 ENCOUNTER — Telehealth (HOSPITAL_BASED_OUTPATIENT_CLINIC_OR_DEPARTMENT_OTHER): Payer: Self-pay | Admitting: *Deleted

## 2021-11-23 DIAGNOSIS — R2681 Unsteadiness on feet: Secondary | ICD-10-CM | POA: Diagnosis not present

## 2021-11-23 DIAGNOSIS — R270 Ataxia, unspecified: Secondary | ICD-10-CM | POA: Diagnosis not present

## 2021-11-23 NOTE — Telephone Encounter (Signed)
Post ED Visit - Positive Culture Follow-up  Culture report reviewed by antimicrobial stewardship pharmacist: [place] Team '[]'$  Elenor Quinones, Pharm.D. '[x]'$  Heide Guile, Pharm.D., BCPS AQ-ID '[]'$  Parks Neptune, Pharm.D., BCPS '[]'$  Alycia Rossetti, Pharm.D., BCPS '[]'$  Morrison, Pharm.D., BCPS, AAHIVP '[]'$  Legrand Como, Pharm.D., BCPS, AAHIVP '[]'$  Salome Arnt, PharmD, BCPS '[]'$  Johnnette Gourd, PharmD, BCPS '[]'$  Hughes Better, PharmD, BCPS '[]'$  Leeroy Cha, PharmD '[]'$  Laqueta Linden, PharmD, BCPS '[]'$  Albertina Parr, PharmD  [place] Team '[]'$  Leodis Sias, PharmD '[]'$  Lindell Spar, PharmD '[]'$  Royetta Asal, PharmD '[]'$  Graylin Shiver, Rph '[]'$  Rema Fendt) Glennon Mac, PharmD '[]'$  Arlyn Dunning, PharmD '[]'$  Netta Cedars, PharmD '[]'$  Dia Sitter, PharmD '[]'$  Leone Haven, PharmD '[]'$  Gretta Arab, PharmD '[]'$  Theodis Shove, PharmD '[]'$  Peggyann Juba, PharmD '[]'$  Reuel Boom, PharmD   Positive urine culture Treated with Cefpodoxime Proxetil, organism sensitive to the same and no further patient follow-up is required at this time.  Harlon Flor [facility] 11/23/2021, 9:28 AM Urine

## 2021-11-24 DIAGNOSIS — R21 Rash and other nonspecific skin eruption: Secondary | ICD-10-CM | POA: Diagnosis not present

## 2021-11-24 DIAGNOSIS — Z09 Encounter for follow-up examination after completed treatment for conditions other than malignant neoplasm: Secondary | ICD-10-CM | POA: Diagnosis not present

## 2021-11-24 DIAGNOSIS — N12 Tubulo-interstitial nephritis, not specified as acute or chronic: Secondary | ICD-10-CM | POA: Diagnosis not present

## 2021-11-24 DIAGNOSIS — Z6832 Body mass index (BMI) 32.0-32.9, adult: Secondary | ICD-10-CM | POA: Diagnosis not present

## 2021-11-28 DIAGNOSIS — R2681 Unsteadiness on feet: Secondary | ICD-10-CM | POA: Diagnosis not present

## 2021-11-28 DIAGNOSIS — R270 Ataxia, unspecified: Secondary | ICD-10-CM | POA: Diagnosis not present

## 2021-11-29 ENCOUNTER — Encounter: Payer: Self-pay | Admitting: *Deleted

## 2021-11-29 DIAGNOSIS — E669 Obesity, unspecified: Secondary | ICD-10-CM | POA: Diagnosis not present

## 2021-11-29 DIAGNOSIS — E1165 Type 2 diabetes mellitus with hyperglycemia: Secondary | ICD-10-CM | POA: Diagnosis not present

## 2021-11-29 DIAGNOSIS — E7800 Pure hypercholesterolemia, unspecified: Secondary | ICD-10-CM | POA: Diagnosis not present

## 2021-11-30 DIAGNOSIS — R270 Ataxia, unspecified: Secondary | ICD-10-CM | POA: Diagnosis not present

## 2021-11-30 DIAGNOSIS — R2681 Unsteadiness on feet: Secondary | ICD-10-CM | POA: Diagnosis not present

## 2021-12-05 ENCOUNTER — Emergency Department (HOSPITAL_BASED_OUTPATIENT_CLINIC_OR_DEPARTMENT_OTHER)
Admission: EM | Admit: 2021-12-05 | Discharge: 2021-12-05 | Disposition: A | Discharge status: Home / Self Care | Payer: Medicare Other | Attending: Emergency Medicine | Admitting: Emergency Medicine

## 2021-12-05 ENCOUNTER — Other Ambulatory Visit (HOSPITAL_BASED_OUTPATIENT_CLINIC_OR_DEPARTMENT_OTHER): Payer: Self-pay

## 2021-12-05 ENCOUNTER — Emergency Department (HOSPITAL_BASED_OUTPATIENT_CLINIC_OR_DEPARTMENT_OTHER): Payer: Medicare Other

## 2021-12-05 ENCOUNTER — Encounter: Payer: Self-pay | Admitting: *Deleted

## 2021-12-05 ENCOUNTER — Other Ambulatory Visit: Payer: Self-pay

## 2021-12-05 ENCOUNTER — Encounter (HOSPITAL_BASED_OUTPATIENT_CLINIC_OR_DEPARTMENT_OTHER): Payer: Self-pay | Admitting: Urology

## 2021-12-05 DIAGNOSIS — R9431 Abnormal electrocardiogram [ECG] [EKG]: Secondary | ICD-10-CM | POA: Diagnosis not present

## 2021-12-05 DIAGNOSIS — N189 Chronic kidney disease, unspecified: Secondary | ICD-10-CM | POA: Diagnosis not present

## 2021-12-05 DIAGNOSIS — E1122 Type 2 diabetes mellitus with diabetic chronic kidney disease: Secondary | ICD-10-CM | POA: Insufficient documentation

## 2021-12-05 DIAGNOSIS — R0602 Shortness of breath: Secondary | ICD-10-CM | POA: Diagnosis not present

## 2021-12-05 DIAGNOSIS — Z7984 Long term (current) use of oral hypoglycemic drugs: Secondary | ICD-10-CM | POA: Diagnosis not present

## 2021-12-05 DIAGNOSIS — I2510 Atherosclerotic heart disease of native coronary artery without angina pectoris: Secondary | ICD-10-CM | POA: Insufficient documentation

## 2021-12-05 DIAGNOSIS — R059 Cough, unspecified: Secondary | ICD-10-CM | POA: Diagnosis present

## 2021-12-05 DIAGNOSIS — M545 Low back pain, unspecified: Secondary | ICD-10-CM | POA: Diagnosis not present

## 2021-12-05 DIAGNOSIS — Z20822 Contact with and (suspected) exposure to covid-19: Secondary | ICD-10-CM | POA: Diagnosis not present

## 2021-12-05 DIAGNOSIS — J069 Acute upper respiratory infection, unspecified: Secondary | ICD-10-CM | POA: Diagnosis not present

## 2021-12-05 DIAGNOSIS — Z7901 Long term (current) use of anticoagulants: Secondary | ICD-10-CM | POA: Diagnosis not present

## 2021-12-05 DIAGNOSIS — J9811 Atelectasis: Secondary | ICD-10-CM | POA: Diagnosis not present

## 2021-12-05 DIAGNOSIS — R6883 Chills (without fever): Secondary | ICD-10-CM | POA: Diagnosis not present

## 2021-12-05 LAB — URINALYSIS, ROUTINE W REFLEX MICROSCOPIC
Bilirubin Urine: NEGATIVE
Glucose, UA: NEGATIVE mg/dL
Ketones, ur: NEGATIVE mg/dL
Leukocytes,Ua: NEGATIVE
Nitrite: NEGATIVE
Protein, ur: 30 mg/dL — AB
Specific Gravity, Urine: 1.015 (ref 1.005–1.030)
pH: 5.5 (ref 5.0–8.0)

## 2021-12-05 LAB — CBC WITH DIFFERENTIAL/PLATELET
Abs Immature Granulocytes: 0 10*3/uL (ref 0.00–0.07)
Basophils Absolute: 0 10*3/uL (ref 0.0–0.1)
Basophils Relative: 1 %
Eosinophils Absolute: 0 10*3/uL (ref 0.0–0.5)
Eosinophils Relative: 0 %
HCT: 40.3 % (ref 39.0–52.0)
Hemoglobin: 13.7 g/dL (ref 13.0–17.0)
Immature Granulocytes: 0 %
Lymphocytes Relative: 44 %
Lymphs Abs: 2 10*3/uL (ref 0.7–4.0)
MCH: 30.9 pg (ref 26.0–34.0)
MCHC: 34 g/dL (ref 30.0–36.0)
MCV: 90.8 fL (ref 80.0–100.0)
Monocytes Absolute: 0.6 10*3/uL (ref 0.1–1.0)
Monocytes Relative: 12 %
Neutro Abs: 2 10*3/uL (ref 1.7–7.7)
Neutrophils Relative %: 43 %
Platelets: 158 10*3/uL (ref 150–400)
RBC: 4.44 MIL/uL (ref 4.22–5.81)
RDW: 12.5 % (ref 11.5–15.5)
WBC: 4.6 10*3/uL (ref 4.0–10.5)
nRBC: 0 % (ref 0.0–0.2)

## 2021-12-05 LAB — URINALYSIS, MICROSCOPIC (REFLEX): RBC / HPF: >50 RBC/hpf (ref 0–5)

## 2021-12-05 LAB — RESP PANEL BY RT-PCR (FLU A&B, COVID) ARPGX2
Influenza A by PCR: NEGATIVE
Influenza B by PCR: NEGATIVE
SARS Coronavirus 2 by RT PCR: NEGATIVE

## 2021-12-05 LAB — COMPREHENSIVE METABOLIC PANEL
ALT: 52 U/L — ABNORMAL HIGH (ref 0–44)
AST: 34 U/L (ref 15–41)
Albumin: 3.7 g/dL (ref 3.5–5.0)
Alkaline Phosphatase: 74 U/L (ref 38–126)
Anion gap: 7 (ref 5–15)
BUN: 25 mg/dL — ABNORMAL HIGH (ref 8–23)
CO2: 28 mmol/L (ref 22–32)
Calcium: 8.5 mg/dL — ABNORMAL LOW (ref 8.9–10.3)
Chloride: 101 mmol/L (ref 98–111)
Creatinine, Ser: 1.79 mg/dL — ABNORMAL HIGH (ref 0.61–1.24)
GFR, Estimated: 38 mL/min — ABNORMAL LOW (ref >60)
Glucose, Bld: 182 mg/dL — ABNORMAL HIGH (ref 70–99)
Potassium: 4.2 mmol/L (ref 3.5–5.1)
Sodium: 136 mmol/L (ref 135–145)
Total Bilirubin: 0.5 mg/dL (ref 0.3–1.2)
Total Protein: 7 g/dL (ref 6.5–8.1)

## 2021-12-05 MED ORDER — DOXYCYCLINE HYCLATE 100 MG PO CAPS
100.0000 mg | ORAL_CAPSULE | Freq: Two times a day (BID) | ORAL | 0 refills | Status: AC
  Filled 2021-12-05: qty 14, 7d supply, fill #0

## 2021-12-05 NOTE — ED Notes (Signed)
Reviewed discharge instructions and medications with pt. Pt is aware and will pick up prescription. Staff transported to lobby. Pt to be picked up by friend

## 2021-12-05 NOTE — ED Triage Notes (Signed)
Pt states flu like symptoms since Friday Reports chills, dizziness, blurred vision, body aches Denies fever

## 2021-12-05 NOTE — ED Provider Notes (Signed)
[place] EMERGENCY DEPARTMENT Provider Note   CSN: [id number] Arrival date & time: 12/05/21  1125     History  Chief Complaint  Patient presents with   Flu like Symptoms     Steve Brown is a 81 y.o. male.  Patient here with flulike symptoms for the last several days.  States he has chills, body aches but denies fever.  History of CKD, CAD, CVA.  History of PE on Xarelto.  Denies any chest pain or shortness of breath.  Has had a mild cough may be.  Has some lower back pain which is also chronic.  Nothing makes it worse or better.  Denies any nausea or vomiting.  No weakness numbness or vision changes.  The history is provided by the patient.       Home Medications Prior to Admission medications   Medication Sig Start Date End Date Taking? Authorizing Provider  doxycycline (VIBRAMYCIN) 100 MG capsule Take 1 capsule (100 mg total) by mouth 2 (two) times daily for 7 days. 12/05/21 12/12/21 Yes Nochum Fenter, DO  albuterol (VENTOLIN HFA) 108 (90 Base) MCG/ACT inhaler Inhale 1-2 puffs into the lungs every 6 (six) hours as needed for wheezing or shortness of breath. 09/06/20   Ward, Lenise Arena, PA-C  bisacodyl (DULCOLAX) 10 MG suppository Place 1 suppository (10 mg total) rectally daily as needed for moderate constipation. 05/31/20   Antonieta Pert, MD  chlorhexidine (PERIDEX) 0.12 % solution as needed. soreness    [provider]  Cholecalciferol (VITAMIN D-1000 MAX ST) 25 MCG (1000 UT) tablet Take by mouth daily.    [provider]  cholestyramine light (PREVALITE) 4 g packet Take 1 packet by mouth as needed for constipation. 05/23/21   [provider]  clobetasol cream (TEMOVATE) 0.05 % Apply topically as needed for rash. 05/31/21   [provider]  diclofenac Sodium (VOLTAREN) 1 % GEL Apply 4 g topically 4 (four) times daily. 09/03/19   Deno Etienne, DO  fluticasone (FLONASE) 50 MCG/ACT nasal spray Place 1 spray into both nostrils daily as  needed for allergies. 06/13/20   Medina-Vargas, Monina C, NP  furosemide (LASIX) 20 MG tablet Take 1-2 tablets (20-40 mg total) by mouth daily as needed for fluid. 06/13/20   Medina-Vargas, Monina C, NP  hydrochlorothiazide (MICROZIDE) 12.5 MG capsule Take 1 capsule (12.5 mg total) by mouth daily. 06/13/20   Medina-Vargas, Monina C, NP  HYDROcodone-acetaminophen (NORCO/VICODIN) 5-325 MG tablet Take 1 tablet by mouth every 4 (four) hours as needed. 11/19/21   Gareth Morgan, MD  hydrOXYzine (VISTARIL) 25 MG capsule as needed for anxiety.    [provider]  insulin aspart (NOVOLOG) 100 UNIT/ML FlexPen Inject 5 Units into the skin 3 (three) times daily with meals. 06/13/20   Medina-Vargas, Monina C, NP  isosorbide mononitrate (IMDUR) 30 MG 24 hr tablet Take 30 mg by mouth daily. 05/31/21   [provider]  losartan (COZAAR) 100 MG tablet Take 1 tablet (100 mg total) by mouth daily. Patient taking differently: Take 50 mg by mouth daily. 06/13/20   Medina-Vargas, Monina C, NP  meclizine (ANTIVERT) 12.5 MG tablet as needed for dizziness. 04/25/21   [provider]  methocarbamol (ROBAXIN) 500 MG tablet as needed for pain.    [provider]  modafinil (PROVIGIL) 100 MG tablet TAKE 1 TABLET BY MOUTH EVERY DAY 06/29/21   Dohmeier, Asencion Partridge, MD  nitroGLYCERIN (NITROSTAT) 0.4 MG SL tablet as needed.    [provider]  nystatin (MYCOSTATIN/NYSTOP)  powder as needed for pain.    [provider]  nystatin cream (MYCOSTATIN) Apply topically as needed for rash. 06/15/21   [provider]  ondansetron (ZOFRAN) 4 MG tablet as needed for nausea/vomiting.    [provider]  OZEMPIC, 0.25 OR 0.5 MG/DOSE, 2 MG/3ML SOPN once a week. 06/11/21   [provider]  pantoprazole (PROTONIX) 40 MG tablet Take 1 tablet (40 mg total) by mouth every morning. 06/13/20   Medina-Vargas, Monina C, NP  polyethylene glycol (MIRALAX / GLYCOLAX) 17 g packet Take 17 g by mouth  daily. 05/31/20   Antonieta Pert, MD  pregabalin (LYRICA) 300 MG capsule Take 300 mg by mouth 2 (two) times daily. 04/26/20   [provider]  rosuvastatin (CRESTOR) 20 MG tablet TAKE 1 TABLET BY MOUTH EVERY DAY 11/02/21   Jettie Booze, MD  sitaGLIPtin (JANUVIA) 50 MG tablet Take 1 tablet (50 mg total) by mouth daily. 06/13/20   Medina-Vargas, Monina C, NP  TRESIBA FLEXTOUCH 100 UNIT/ML FlexTouch Pen Inject 34 Units into the skin daily. 06/13/20   Medina-Vargas, Monina C, NP  valACYclovir (VALTREX) 500 MG tablet Take 500 mg by mouth daily. 01/25/21   [provider]  XARELTO 20 MG TABS tablet Take 1 tablet (20 mg total) by mouth daily. 06/13/20   Medina-Vargas, Monina C, NP      Allergies    Other, Phenergan [promethazine], Iohexol, Iohexol, Phenergan [promethazine], and Tramadol    Review of Systems   Review of Systems  Physical Exam Updated Vital Signs BP (!) 153/76   Pulse (!) 58   Temp 97.7 F (36.5 C) (Oral)   Resp 16   Ht '5\' 5"'$  (1.651 m)   Wt 82.1 kg   SpO2 100%   BMI 30.12 kg/m  Physical Exam Vitals and nursing note reviewed.  Constitutional:      General: He is not in acute distress.    Appearance: He is well-developed. He is not ill-appearing.  HENT:     Head: Normocephalic and atraumatic.     Mouth/Throat:     Mouth: Mucous membranes are moist.  Eyes:     Extraocular Movements: Extraocular movements intact.     Conjunctiva/sclera: Conjunctivae normal.     Pupils: Pupils are equal, round, and reactive to light.  Cardiovascular:     Rate and Rhythm: Normal rate and regular rhythm.     Pulses: Normal pulses.     Heart sounds: Normal heart sounds. No murmur heard. Pulmonary:     Effort: Pulmonary effort is normal. No respiratory distress.     Breath sounds: Normal breath sounds.  Abdominal:     General: Abdomen is flat.     Palpations: Abdomen is soft.     Tenderness: There is no abdominal tenderness.  Musculoskeletal:        General: No  swelling.     Cervical back: Normal range of motion and neck supple.  Skin:    General: Skin is warm and dry.     Capillary Refill: Capillary refill takes less than 2 seconds.  Neurological:     General: No focal deficit present.     Mental Status: He is alert and oriented to person, place, and time.     Cranial Nerves: No cranial nerve deficit.     Sensory: No sensory deficit.     Motor: No weakness.     Coordination: Coordination normal.  Psychiatric:        Mood and Affect: Mood normal.  ED Results / Procedures / Treatments   Labs (all labs ordered are listed, but only abnormal results are displayed) Labs Reviewed  URINALYSIS, ROUTINE W REFLEX MICROSCOPIC - Abnormal; Notable for the following components:      Result Value   Hgb urine dipstick MODERATE (*)    Protein, ur 30 (*)    All other components within normal limits  URINALYSIS, MICROSCOPIC (REFLEX) - Abnormal; Notable for the following components:   Bacteria, UA RARE (*)    All other components within normal limits  COMPREHENSIVE METABOLIC PANEL - Abnormal; Notable for the following components:   Glucose, Bld 182 (*)    BUN 25 (*)    Creatinine, Ser 1.79 (*)    Calcium 8.5 (*)    ALT 52 (*)    GFR, Estimated 38 (*)    All other components within normal limits  RESP PANEL BY RT-PCR (FLU A&B, COVID) ARPGX2  CBC WITH DIFFERENTIAL/PLATELET    EKG EKG Interpretation  Date/Time:  Monday December 05 2021 11:38:13 EST Ventricular Rate:  63 PR Interval:  168 QRS Duration: 86 QT Interval:  396 QTC Calculation: 405 R Axis:   -7 Text Interpretation: Normal sinus rhythm Normal ECG When compared with ECG of 30-May-2021 13:50, PREVIOUS ECG IS PRESENT Confirmed by Lennice Sites ([id number]) on 12/05/2021 2:50:33 PM  Radiology DG Chest Portable 1 View  Result Date: 12/05/2021 CLINICAL DATA:  Shortness of breath. EXAM: PORTABLE CHEST 1 VIEW COMPARISON:  May 30, 2021. FINDINGS: Stable cardiomediastinal silhouette. Right  lung is clear. Minimal left basilar subsegmental atelectasis is noted. The visualized skeletal structures are unremarkable. IMPRESSION: Minimal left basilar subsegmental atelectasis. Electronically Signed   By: Marijo Conception M.D.   On: 12/05/2021 15:11    Procedures Procedures    Medications Ordered in ED Medications - No data to display  ED Course/ Medical Decision Making/ A&P                           Medical Decision Making Amount and/or Complexity of Data Reviewed Labs: ordered. Radiology: ordered.  Risk Prescription drug management.   Steve Brown is here with flulike symptoms.  Normal vitals.  No fever.  Multiple comorbidities including diabetes, CVA, PE, CAD.  Patient on Xarelto.  Differential diagnosis likely viral process versus pneumonia versus dehydration versus anemia.  Will get basic labs including CBC BMP chest x-ray and COVID and flu testing.  Will reevaluate.  Per my review and interpreted the x-ray may be pneumonia.  Otherwise lab work unremarkable with no significant anemia, electrolyte abnormality or leukocytosis.  COVID and flu test are negative.  Is negative for infection.  Will cover for possible infectious process with doxycycline.  However suspect viral process.  Very well-appearing.  Discharged in good condition.  This chart was dictated using voice recognition software.  Despite best efforts to proofread,  errors can occur which can change the documentation meaning.         Final Clinical Impression(s) / ED Diagnoses Final diagnoses:  Upper respiratory tract infection, unspecified type    Rx / DC Orders ED Discharge Orders          Ordered    doxycycline (VIBRAMYCIN) 100 MG capsule  2 times daily        12/05/21 1629              Lennice Sites, DO 12/05/21 1630

## 2021-12-05 NOTE — Progress Notes (Signed)
Pt states he is still seeing Dr. Lona Kettle as his PCP - Saw Dr. Chalmers Cater for recheck of A1C this month and b/p at last office visit before that was 111/65. This AM pt states he has just finished breakfast and is heading to [facility] ED bc he knows he cannot get in to see his PCP today ("they can't just work you in") - and he likes [employer] bc "they get you in and out - will even check your toenails, and if I pass out, they will be right there to take care of me." - Pt states he has been feeling achey and "out of it" for 3 days and has called transportation to take him in to get "checked out." - This RN gave pt her phone number in case he needs further assistance r/t  his current illness. Pt verbalized gratitude for concern and took this RN phone number and name in case needed.

## 2021-12-06 ENCOUNTER — Encounter: Payer: Self-pay | Admitting: *Deleted

## 2021-12-06 NOTE — Progress Notes (Signed)
Pt called to let me know he was seen in the [facility] ED and wanted help connecting to the [facility]- he had had to prove he was registered to get their services but was not sure which number to call, so this RN confirmed the [place] pt appt phone number. Pt expressed appreciation for the help. Yvonna Alanis, RN, 4:24 P

## 2021-12-07 DIAGNOSIS — N12 Tubulo-interstitial nephritis, not specified as acute or chronic: Secondary | ICD-10-CM | POA: Diagnosis not present

## 2021-12-08 DIAGNOSIS — E669 Obesity, unspecified: Secondary | ICD-10-CM | POA: Diagnosis not present

## 2021-12-08 DIAGNOSIS — N189 Chronic kidney disease, unspecified: Secondary | ICD-10-CM | POA: Diagnosis not present

## 2021-12-08 DIAGNOSIS — G609 Hereditary and idiopathic neuropathy, unspecified: Secondary | ICD-10-CM | POA: Diagnosis not present

## 2021-12-08 DIAGNOSIS — I639 Cerebral infarction, unspecified: Secondary | ICD-10-CM | POA: Diagnosis not present

## 2021-12-08 DIAGNOSIS — C61 Malignant neoplasm of prostate: Secondary | ICD-10-CM | POA: Diagnosis not present

## 2021-12-08 DIAGNOSIS — E1165 Type 2 diabetes mellitus with hyperglycemia: Secondary | ICD-10-CM | POA: Diagnosis not present

## 2021-12-08 DIAGNOSIS — I10 Essential (primary) hypertension: Secondary | ICD-10-CM | POA: Diagnosis not present

## 2021-12-08 DIAGNOSIS — E7800 Pure hypercholesterolemia, unspecified: Secondary | ICD-10-CM | POA: Diagnosis not present

## 2021-12-12 DIAGNOSIS — R2681 Unsteadiness on feet: Secondary | ICD-10-CM | POA: Diagnosis not present

## 2021-12-12 DIAGNOSIS — R270 Ataxia, unspecified: Secondary | ICD-10-CM | POA: Diagnosis not present

## 2021-12-14 DIAGNOSIS — R2681 Unsteadiness on feet: Secondary | ICD-10-CM | POA: Diagnosis not present

## 2021-12-14 DIAGNOSIS — R270 Ataxia, unspecified: Secondary | ICD-10-CM | POA: Diagnosis not present

## 2021-12-15 DIAGNOSIS — M5416 Radiculopathy, lumbar region: Secondary | ICD-10-CM | POA: Diagnosis not present

## 2021-12-15 DIAGNOSIS — Z6831 Body mass index (BMI) 31.0-31.9, adult: Secondary | ICD-10-CM | POA: Diagnosis not present

## 2021-12-15 DIAGNOSIS — R899 Unspecified abnormal finding in specimens from other organs, systems and tissues: Secondary | ICD-10-CM | POA: Diagnosis not present

## 2021-12-15 DIAGNOSIS — N183 Chronic kidney disease, stage 3 unspecified: Secondary | ICD-10-CM | POA: Diagnosis not present

## 2021-12-15 DIAGNOSIS — Z09 Encounter for follow-up examination after completed treatment for conditions other than malignant neoplasm: Secondary | ICD-10-CM | POA: Diagnosis not present

## 2021-12-15 DIAGNOSIS — R7401 Elevation of levels of liver transaminase levels: Secondary | ICD-10-CM | POA: Diagnosis not present

## 2021-12-19 DIAGNOSIS — R2681 Unsteadiness on feet: Secondary | ICD-10-CM | POA: Diagnosis not present

## 2021-12-19 DIAGNOSIS — R270 Ataxia, unspecified: Secondary | ICD-10-CM | POA: Diagnosis not present

## 2021-12-21 DIAGNOSIS — R2681 Unsteadiness on feet: Secondary | ICD-10-CM | POA: Diagnosis not present

## 2021-12-21 DIAGNOSIS — R270 Ataxia, unspecified: Secondary | ICD-10-CM | POA: Diagnosis not present

## 2021-12-22 DIAGNOSIS — E782 Mixed hyperlipidemia: Secondary | ICD-10-CM | POA: Diagnosis not present

## 2021-12-22 DIAGNOSIS — N183 Chronic kidney disease, stage 3 unspecified: Secondary | ICD-10-CM | POA: Diagnosis not present

## 2021-12-22 DIAGNOSIS — E1140 Type 2 diabetes mellitus with diabetic neuropathy, unspecified: Secondary | ICD-10-CM | POA: Diagnosis not present

## 2021-12-22 DIAGNOSIS — I10 Essential (primary) hypertension: Secondary | ICD-10-CM | POA: Diagnosis not present

## 2021-12-26 DIAGNOSIS — R2681 Unsteadiness on feet: Secondary | ICD-10-CM | POA: Diagnosis not present

## 2021-12-26 DIAGNOSIS — R270 Ataxia, unspecified: Secondary | ICD-10-CM | POA: Diagnosis not present

## 2021-12-28 DIAGNOSIS — R270 Ataxia, unspecified: Secondary | ICD-10-CM | POA: Diagnosis not present

## 2021-12-28 DIAGNOSIS — M545 Low back pain, unspecified: Secondary | ICD-10-CM | POA: Diagnosis not present

## 2021-12-28 DIAGNOSIS — M5116 Intervertebral disc disorders with radiculopathy, lumbar region: Secondary | ICD-10-CM | POA: Diagnosis not present

## 2021-12-28 DIAGNOSIS — R2681 Unsteadiness on feet: Secondary | ICD-10-CM | POA: Diagnosis not present

## 2022-01-04 DIAGNOSIS — R2681 Unsteadiness on feet: Secondary | ICD-10-CM | POA: Diagnosis not present

## 2022-01-04 DIAGNOSIS — R270 Ataxia, unspecified: Secondary | ICD-10-CM | POA: Diagnosis not present

## 2022-01-10 DIAGNOSIS — I739 Peripheral vascular disease, unspecified: Secondary | ICD-10-CM | POA: Diagnosis not present

## 2022-01-10 DIAGNOSIS — L603 Nail dystrophy: Secondary | ICD-10-CM | POA: Diagnosis not present

## 2022-01-10 DIAGNOSIS — E1142 Type 2 diabetes mellitus with diabetic polyneuropathy: Secondary | ICD-10-CM | POA: Diagnosis not present

## 2022-01-10 DIAGNOSIS — E1151 Type 2 diabetes mellitus with diabetic peripheral angiopathy without gangrene: Secondary | ICD-10-CM | POA: Diagnosis not present

## 2022-01-11 DIAGNOSIS — R270 Ataxia, unspecified: Secondary | ICD-10-CM | POA: Diagnosis not present

## 2022-01-11 DIAGNOSIS — R2681 Unsteadiness on feet: Secondary | ICD-10-CM | POA: Diagnosis not present

## 2022-01-13 DIAGNOSIS — R2681 Unsteadiness on feet: Secondary | ICD-10-CM | POA: Diagnosis not present

## 2022-01-13 DIAGNOSIS — R270 Ataxia, unspecified: Secondary | ICD-10-CM | POA: Diagnosis not present

## 2022-01-16 DIAGNOSIS — R270 Ataxia, unspecified: Secondary | ICD-10-CM | POA: Diagnosis not present

## 2022-01-16 DIAGNOSIS — R2681 Unsteadiness on feet: Secondary | ICD-10-CM | POA: Diagnosis not present

## 2022-01-18 DIAGNOSIS — R270 Ataxia, unspecified: Secondary | ICD-10-CM | POA: Diagnosis not present

## 2022-01-18 DIAGNOSIS — R2681 Unsteadiness on feet: Secondary | ICD-10-CM | POA: Diagnosis not present

## 2022-01-23 DIAGNOSIS — R270 Ataxia, unspecified: Secondary | ICD-10-CM | POA: Diagnosis not present

## 2022-01-23 DIAGNOSIS — R2681 Unsteadiness on feet: Secondary | ICD-10-CM | POA: Diagnosis not present

## 2022-01-24 DIAGNOSIS — F411 Generalized anxiety disorder: Secondary | ICD-10-CM | POA: Diagnosis not present

## 2022-01-24 DIAGNOSIS — G629 Polyneuropathy, unspecified: Secondary | ICD-10-CM | POA: Diagnosis not present

## 2022-01-24 DIAGNOSIS — R2689 Other abnormalities of gait and mobility: Secondary | ICD-10-CM | POA: Diagnosis not present

## 2022-01-24 DIAGNOSIS — Z6832 Body mass index (BMI) 32.0-32.9, adult: Secondary | ICD-10-CM | POA: Diagnosis not present

## 2022-01-25 DIAGNOSIS — R270 Ataxia, unspecified: Secondary | ICD-10-CM | POA: Diagnosis not present

## 2022-01-25 DIAGNOSIS — R2681 Unsteadiness on feet: Secondary | ICD-10-CM | POA: Diagnosis not present

## 2022-01-30 DIAGNOSIS — R2681 Unsteadiness on feet: Secondary | ICD-10-CM | POA: Diagnosis not present

## 2022-01-30 DIAGNOSIS — R270 Ataxia, unspecified: Secondary | ICD-10-CM | POA: Diagnosis not present

## 2022-02-01 DIAGNOSIS — R2681 Unsteadiness on feet: Secondary | ICD-10-CM | POA: Diagnosis not present

## 2022-02-01 DIAGNOSIS — R270 Ataxia, unspecified: Secondary | ICD-10-CM | POA: Diagnosis not present

## 2022-02-03 DIAGNOSIS — N3000 Acute cystitis without hematuria: Secondary | ICD-10-CM | POA: Diagnosis not present

## 2022-02-06 ENCOUNTER — Other Ambulatory Visit: Payer: Self-pay

## 2022-02-06 ENCOUNTER — Emergency Department (HOSPITAL_COMMUNITY)
Admission: EM | Admit: 2022-02-06 | Discharge: 2022-02-07 | Disposition: A | Discharge status: Home / Self Care | Payer: Medicare Other | Attending: Emergency Medicine | Admitting: Emergency Medicine

## 2022-02-06 ENCOUNTER — Emergency Department (HOSPITAL_COMMUNITY): Payer: Medicare Other

## 2022-02-06 DIAGNOSIS — Z794 Long term (current) use of insulin: Secondary | ICD-10-CM | POA: Insufficient documentation

## 2022-02-06 DIAGNOSIS — I129 Hypertensive chronic kidney disease with stage 1 through stage 4 chronic kidney disease, or unspecified chronic kidney disease: Secondary | ICD-10-CM | POA: Insufficient documentation

## 2022-02-06 DIAGNOSIS — E1122 Type 2 diabetes mellitus with diabetic chronic kidney disease: Secondary | ICD-10-CM | POA: Insufficient documentation

## 2022-02-06 DIAGNOSIS — Z79899 Other long term (current) drug therapy: Secondary | ICD-10-CM | POA: Insufficient documentation

## 2022-02-06 DIAGNOSIS — Z8673 Personal history of transient ischemic attack (TIA), and cerebral infarction without residual deficits: Secondary | ICD-10-CM | POA: Diagnosis not present

## 2022-02-06 DIAGNOSIS — R42 Dizziness and giddiness: Secondary | ICD-10-CM | POA: Diagnosis not present

## 2022-02-06 DIAGNOSIS — Z7901 Long term (current) use of anticoagulants: Secondary | ICD-10-CM | POA: Diagnosis not present

## 2022-02-06 DIAGNOSIS — G4489 Other headache syndrome: Secondary | ICD-10-CM | POA: Diagnosis not present

## 2022-02-06 DIAGNOSIS — I959 Hypotension, unspecified: Secondary | ICD-10-CM | POA: Diagnosis not present

## 2022-02-06 DIAGNOSIS — Z7984 Long term (current) use of oral hypoglycemic drugs: Secondary | ICD-10-CM | POA: Diagnosis not present

## 2022-02-06 DIAGNOSIS — N189 Chronic kidney disease, unspecified: Secondary | ICD-10-CM | POA: Insufficient documentation

## 2022-02-06 DIAGNOSIS — R29898 Other symptoms and signs involving the musculoskeletal system: Secondary | ICD-10-CM | POA: Diagnosis not present

## 2022-02-06 DIAGNOSIS — R739 Hyperglycemia, unspecified: Secondary | ICD-10-CM | POA: Diagnosis not present

## 2022-02-06 DIAGNOSIS — I2510 Atherosclerotic heart disease of native coronary artery without angina pectoris: Secondary | ICD-10-CM | POA: Diagnosis not present

## 2022-02-06 DIAGNOSIS — R55 Syncope and collapse: Secondary | ICD-10-CM | POA: Diagnosis not present

## 2022-02-06 DIAGNOSIS — R519 Headache, unspecified: Secondary | ICD-10-CM | POA: Diagnosis not present

## 2022-02-06 DIAGNOSIS — R531 Weakness: Secondary | ICD-10-CM | POA: Diagnosis not present

## 2022-02-06 LAB — BASIC METABOLIC PANEL
Anion gap: 6 (ref 5–15)
BUN: 20 mg/dL (ref 8–23)
CO2: 28 mmol/L (ref 22–32)
Calcium: 8.3 mg/dL — ABNORMAL LOW (ref 8.9–10.3)
Chloride: 102 mmol/L (ref 98–111)
Creatinine, Ser: 1.5 mg/dL — ABNORMAL HIGH (ref 0.61–1.24)
GFR, Estimated: 47 mL/min — ABNORMAL LOW (ref >60)
Glucose, Bld: 199 mg/dL — ABNORMAL HIGH (ref 70–99)
Potassium: 4.1 mmol/L (ref 3.5–5.1)
Sodium: 136 mmol/L (ref 135–145)

## 2022-02-06 LAB — CBC
HCT: 38.5 % — ABNORMAL LOW (ref 39.0–52.0)
Hemoglobin: 12.9 g/dL — ABNORMAL LOW (ref 13.0–17.0)
MCH: 30.8 pg (ref 26.0–34.0)
MCHC: 33.5 g/dL (ref 30.0–36.0)
MCV: 91.9 fL (ref 80.0–100.0)
Platelets: 193 10*3/uL (ref 150–400)
RBC: 4.19 MIL/uL — ABNORMAL LOW (ref 4.22–5.81)
RDW: 12.9 % (ref 11.5–15.5)
WBC: 5.1 10*3/uL (ref 4.0–10.5)
nRBC: 0 % (ref 0.0–0.2)

## 2022-02-06 LAB — APTT: aPTT: 33 seconds (ref 24–36)

## 2022-02-06 LAB — PROTIME-INR
INR: 3 — ABNORMAL HIGH (ref 0.8–1.2)
Prothrombin Time: 30.9 seconds — ABNORMAL HIGH (ref 11.4–15.2)

## 2022-02-06 MED ORDER — LORAZEPAM 2 MG/ML IJ SOLN
0.5000 mg | Freq: Once | INTRAMUSCULAR | Status: AC
  Administered 2022-02-06: 0.5 mg via INTRAVENOUS
  Filled 2022-02-06: qty 1

## 2022-02-06 NOTE — ED Provider Notes (Signed)
[place] [facility]ERG[facility] DEPARTMENT AT [facility] Provider Note   CSN: [id number] Arrival date & time: 02/06/22  1300     History {Add pertinent medical, surgical, social history, OB history to HPI:1} Chief Complaint  Patient presents with   Dizziness   Headache    Steve Brown is a 82 y.o. male.  The history is provided by the patient and medical records.  Dizziness Associated symptoms: headaches   Headache Associated symptoms: dizziness    82 year old male with history of diabetes, CAD, history of TIA and stroke, chronic kidney disease, hypertension, presenting to the ED with dizziness.  He reports this has been ongoing for about 3 days now.  He does have history of dizziness in the past but states this feels different.  States while sitting he often feels like things are moving around him which is making him unsteady.  States when walking he seems to stumble to his left side.  He does have some chronic weakness of left leg from prior stroke, however states worse than normal.  He has not had any falls or head trauma.  He is usually able to walk laps at the gym but not able to do this now due to his leg.  He also reports headache, mostly localized along the right temple.  He states when he touches his head at this area it is very "tender".  He does report some intermittent blurred vision, sometimes if he blinks several times in rapid sequence it gets better but not always.  He denies any fever, chills, neck stiffness, or pain with neck ROM.  He is compliant with his Xarelto.  Home Medications Prior to Admission medications   Medication Sig Start Date End Date Taking? Authorizing Provider  albuterol (VENTOLIN HFA) 108 (90 Base) MCG/ACT inhaler Inhale 1-2 puffs into the lungs every 6 (six) hours as needed for wheezing or shortness of breath. 09/06/20   Ward, Lenise Arena, PA-C  bisacodyl (DULCOLAX) 10 MG suppository Place 1 suppository (10 mg total) rectally daily as needed for  moderate constipation. 05/31/20   Antonieta Pert, MD  chlorhexidine (PERIDEX) 0.12 % solution as needed. soreness    [provider]  Cholecalciferol (VITAMIN D-1000 MAX ST) 25 MCG (1000 UT) tablet Take by mouth daily.    [provider]  cholestyramine light (PREVALITE) 4 g packet Take 1 packet by mouth as needed for constipation. 05/23/21   [provider]  clobetasol cream (TEMOVATE) 0.05 % Apply topically as needed for rash. 05/31/21   [provider]  diclofenac Sodium (VOLTAREN) 1 % GEL Apply 4 g topically 4 (four) times daily. 09/03/19   Deno Etienne, DO  fluticasone (FLONASE) 50 MCG/ACT nasal spray Place 1 spray into both nostrils daily as needed for allergies. 06/13/20   Medina-Vargas, Monina C, NP  furosemide (LASIX) 20 MG tablet Take 1-2 tablets (20-40 mg total) by mouth daily as needed for fluid. 06/13/20   Medina-Vargas, Monina C, NP  hydrochlorothiazide (MICROZIDE) 12.5 MG capsule Take 1 capsule (12.5 mg total) by mouth daily. 06/13/20   Medina-Vargas, Monina C, NP  HYDROcodone-acetaminophen (NORCO/VICODIN) 5-325 MG tablet Take 1 tablet by mouth every 4 (four) hours as needed. 11/19/21   Gareth Morgan, MD  hydrOXYzine (VISTARIL) 25 MG capsule as needed for anxiety.    [provider]  insulin aspart (NOVOLOG) 100 UNIT/ML FlexPen Inject 5 Units into the skin 3 (three) times daily with meals. 06/13/20   Medina-Vargas, Monina C, NP  isosorbide mononitrate (IMDUR) 30 MG  24 hr tablet Take 30 mg by mouth daily. 05/31/21   [provider]  losartan (COZAAR) 100 MG tablet Take 1 tablet (100 mg total) by mouth daily. Patient taking differently: Take 50 mg by mouth daily. 06/13/20   Medina-Vargas, Monina C, NP  meclizine (ANTIVERT) 12.5 MG tablet as needed for dizziness. 04/25/21   [provider]  methocarbamol (ROBAXIN) 500 MG tablet as needed for pain.    [provider]  modafinil (PROVIGIL) 100 MG tablet TAKE 1 TABLET BY MOUTH EVERY DAY  06/29/21   Dohmeier, Asencion Partridge, MD  nitroGLYCERIN (NITROSTAT) 0.4 MG SL tablet as needed.    [provider]  nystatin (MYCOSTATIN/NYSTOP) powder as needed for pain.    [provider]  nystatin cream (MYCOSTATIN) Apply topically as needed for rash. 06/15/21   [provider]  ondansetron (ZOFRAN) 4 MG tablet as needed for nausea/vomiting.    [provider]  OZEMPIC, 0.25 OR 0.5 MG/DOSE, 2 MG/3ML SOPN once a week. 06/11/21   [provider]  pantoprazole (PROTONIX) 40 MG tablet Take 1 tablet (40 mg total) by mouth every morning. 06/13/20   Medina-Vargas, Monina C, NP  polyethylene glycol (MIRALAX / GLYCOLAX) 17 g packet Take 17 g by mouth daily. 05/31/20   Antonieta Pert, MD  pregabalin (LYRICA) 300 MG capsule Take 300 mg by mouth 2 (two) times daily. 04/26/20   [provider]  rosuvastatin (CRESTOR) 20 MG tablet TAKE 1 TABLET BY MOUTH EVERY DAY 11/02/21   Jettie Booze, MD  sitaGLIPtin (JANUVIA) 50 MG tablet Take 1 tablet (50 mg total) by mouth daily. 06/13/20   Medina-Vargas, Monina C, NP  TRESIBA FLEXTOUCH 100 UNIT/ML FlexTouch Pen Inject 34 Units into the skin daily. 06/13/20   Medina-Vargas, Monina C, NP  valACYclovir (VALTREX) 500 MG tablet Take 500 mg by mouth daily. 01/25/21   [provider]  XARELTO 20 MG TABS tablet Take 1 tablet (20 mg total) by mouth daily. 06/13/20   Medina-Vargas, Monina C, NP      Allergies    Other, Phenergan [promethazine], Iohexol, Iohexol, Phenergan [promethazine], and Tramadol    Review of Systems   Review of Systems  Neurological:  Positive for dizziness and headaches.  All other systems reviewed and are negative.   Physical Exam Updated Vital Signs BP (!) 154/83   Pulse (!) 55   Temp (!) 97.4 F (36.3 C) (Oral)   Resp (!) 21   Ht '5\' 5"'$  (1.651 m)   Wt 83.5 kg   SpO2 99%   BMI 30.62 kg/m  Physical Exam Vitals and nursing note reviewed.  Constitutional:      Appearance: He is well-developed.   HENT:     Head: Normocephalic and atraumatic.     Comments: Some mild tenderness of right temple Eyes:     Conjunctiva/sclera: Conjunctivae normal.     Pupils: Pupils are equal, round, and reactive to light.  Cardiovascular:     Rate and Rhythm: Normal rate and regular rhythm.     Heart sounds: Normal heart sounds.  Pulmonary:     Effort: Pulmonary effort is normal.     Breath sounds: Normal breath sounds.  Abdominal:     General: Bowel sounds are normal.     Palpations: Abdomen is soft.  Musculoskeletal:        General: Normal range of motion.     Cervical back: Normal range of motion.  Skin:    General: Skin is warm and dry.  Neurological:     Mental Status: He is alert and oriented to person, place, and time.     Comments: AAOx3, answering questions and following commands appropriately; left leg 4/5, otherwise extremities 5/5 strength; CN grossly intact; moves all extremities appropriately without ataxia; speech clear, goal oriented     ED Results / Procedures / Treatments   Labs (all labs ordered are listed, but only abnormal results are displayed) Labs Reviewed  CBC - Abnormal; Notable for the following components:      Result Value   RBC 4.19 (*)    Hemoglobin 12.9 (*)    HCT 38.5 (*)    All other components within normal limits  BASIC METABOLIC PANEL - Abnormal; Notable for the following components:   Glucose, Bld 199 (*)    Creatinine, Ser 1.50 (*)    Calcium 8.3 (*)    GFR, Estimated 47 (*)    All other components within normal limits  PROTIME-INR - Abnormal; Notable for the following components:   Prothrombin Time 30.9 (*)    INR 3.0 (*)    All other components within normal limits  URINE CULTURE  APTT  URINALYSIS, ROUTINE W REFLEX MICROSCOPIC  SEDIMENTATION RATE  C-REACTIVE PROTEIN    EKG None  Radiology CT Head Wo Contrast  Result Date: 02/06/2022 CLINICAL DATA:  Dizziness EXAM: CT HEAD WITHOUT CONTRAST TECHNIQUE: Contiguous axial images were  obtained from the base of the skull through the vertex without intravenous contrast. RADIATION DOSE REDUCTION: This exam was performed according to the departmental dose-optimization program which includes automated exposure control, adjustment of the mA and/or kV according to patient size and/or use of iterative reconstruction technique. COMPARISON:  CT Head 05/30/21 FINDINGS: Brain: No evidence of acute infarction, hemorrhage, hydrocephalus, extra-axial collection or mass lesion/mass effect. Chronic left cerebellar infarct. Vascular: No hyperdense vessel or unexpected calcification. Skull: Normal. Negative for fracture or focal lesion. Sinuses/Orbits: Bilateral lens replacement. Complete opacification of the right sphenoid and left frontal sinus. Partial opacification of the left-sided ethmoid air cells. No middle ear or mastoid effusion. Other: None. IMPRESSION: 1. No acute intracranial abnormality. 2. Chronic left cerebellar infarct. 3. Complete opacification of the right sphenoid and left frontal sinus. Partial opacification of the left-sided ethmoid air cells. Correlate for sinusitis. Electronically Signed   By: Marin Roberts M.D.   On: 02/06/2022 14:22    Procedures Procedures  {Document cardiac monitor, telemetry assessment procedure when appropriate:1}  Medications Ordered in ED Medications  LORazepam (ATIVAN) injection 0.5 mg (has no administration in time range)    ED Course/ Medical Decision Making/ A&P   {   Click here for ABCD2, HEART and other calculatorsREFRESH Note before signing :1}                          Medical Decision Making Amount and/or Complexity of Data Reviewed Labs: ordered. Radiology: ordered.  Risk Prescription drug management.   ***  {Document critical care time when appropriate:1} {Document review of labs and clinical decision tools ie heart score, Chads2Vasc2 etc:1}  {Document your independent review of radiology images, and any outside  records:1} {Document your discussion with family members, caretakers, and with consultants:1} {Document social determinants of health affecting pt's care:1} {Document your decision making why or why not admission, treatments were needed:1} Final Clinical Impression(s) / ED Diagnoses Final diagnoses:  None    Rx / DC Orders ED Discharge Orders     None

## 2022-02-06 NOTE — ED Triage Notes (Signed)
EMS reports from home called out for dizziness and headache x 3 days, on scene Pt reports currently being treated for recent UTI with ABX.  BP 130/68 HR 63 RR 18 Sp02 98 RA CBG 266  20:LAC 478m NS enroute

## 2022-02-06 NOTE — ED Provider Triage Note (Signed)
Emergency Medicine Provider Triage Evaluation Note  Steve Brown , a 82 y.o. male  was evaluated in triage.  Pt complains of dizziness, history of CVA.  Patient reports dizziness for the last 2 days.  Patient also reporting right-sided headache, not similar to past instances of dizziness.  Patient does have history of dizziness on chart review however states that this dizziness is different.  Patient denies falls, denies nausea or vomiting, denies chest pain or shortness of breath.  Patient reports compliance on anticoagulation.  Review of Systems  Positive:  Negative:   Physical Exam  BP 128/72 (BP Location: Right Arm)   Pulse 89   Temp 98.3 F (36.8 C) (Oral)   Resp 16   Ht '5\' 5"'$  (1.651 m)   Wt 83.5 kg   SpO2 100%   BMI 30.62 kg/m  Gen:   Awake, no distress   Resp:  Normal effort  MSK:   Moves extremities without difficulty  Other:  No focal deficits  Medical Decision Making  Medically screening exam initiated at 1:27 PM.  Appropriate orders placed.  Garritt Molyneux was informed that the remainder of the evaluation will be completed by another provider, this initial triage assessment does not replace that evaluation, and the importance of remaining in the ED until their evaluation is complete.     Azucena Cecil, PA-C 02/06/22 1328

## 2022-02-07 ENCOUNTER — Emergency Department (HOSPITAL_COMMUNITY): Payer: Medicare Other

## 2022-02-07 DIAGNOSIS — R42 Dizziness and giddiness: Secondary | ICD-10-CM | POA: Diagnosis not present

## 2022-02-07 DIAGNOSIS — R531 Weakness: Secondary | ICD-10-CM | POA: Diagnosis not present

## 2022-02-07 DIAGNOSIS — R29898 Other symptoms and signs involving the musculoskeletal system: Secondary | ICD-10-CM | POA: Diagnosis not present

## 2022-02-07 DIAGNOSIS — R55 Syncope and collapse: Secondary | ICD-10-CM | POA: Diagnosis not present

## 2022-02-07 LAB — URINALYSIS, ROUTINE W REFLEX MICROSCOPIC
Bilirubin Urine: NEGATIVE
Glucose, UA: NEGATIVE mg/dL
Ketones, ur: 5 mg/dL — AB
Nitrite: POSITIVE — AB
Protein, ur: >=300 mg/dL — AB
Specific Gravity, Urine: 1.014 (ref 1.005–1.030)
WBC, UA: >50 WBC/hpf (ref 0–5)
pH: 6 (ref 5.0–8.0)

## 2022-02-07 LAB — SEDIMENTATION RATE: Sed Rate: 21 mm/hr — ABNORMAL HIGH (ref 0–16)

## 2022-02-07 LAB — C-REACTIVE PROTEIN: CRP: 0.5 mg/dL (ref <1.0)

## 2022-02-07 NOTE — Discharge Instructions (Signed)
Your MRI today was negative for any new stroke or other findings. We do recommend that you follow-up with your neurologist about this. Urine still does have some signs of infection--continue your antibiotics.  Please follow-up with your primary care doctor about this. Return here for any new or acute changes.

## 2022-02-08 DIAGNOSIS — R270 Ataxia, unspecified: Secondary | ICD-10-CM | POA: Diagnosis not present

## 2022-02-08 DIAGNOSIS — R2681 Unsteadiness on feet: Secondary | ICD-10-CM | POA: Diagnosis not present

## 2022-02-09 LAB — URINE CULTURE: Culture: >=100000 — AB

## 2022-02-10 ENCOUNTER — Telehealth (HOSPITAL_BASED_OUTPATIENT_CLINIC_OR_DEPARTMENT_OTHER): Payer: Self-pay | Admitting: Emergency Medicine

## 2022-02-10 DIAGNOSIS — R270 Ataxia, unspecified: Secondary | ICD-10-CM | POA: Diagnosis not present

## 2022-02-10 DIAGNOSIS — R2681 Unsteadiness on feet: Secondary | ICD-10-CM | POA: Diagnosis not present

## 2022-02-10 NOTE — Progress Notes (Signed)
ED Antimicrobial Stewardship Positive Culture Follow Up   Steve Brown is an 82 y.o. male who presented to [facility] on 02/06/2022 with a chief complaint of  Chief Complaint  Patient presents with   Dizziness   Headache    Recent Results (from the past 720 hour(s))  Urine Culture (for pregnant, neutropenic or urologic patients or patients with an indwelling urinary catheter)     Status: Abnormal   Collection Time: 02/07/22  2:38 AM   Specimen: Urine, Clean Catch  Result Value Ref Range Status   Specimen Description   Final    URINE, CLEAN CATCH Performed at [place] [address], [place], [place] [address]    Special Requests   Final    NONE Performed at [facility], [place] [address] [place], [place] [address]    Culture (A)  Final    >=100,000 COLONIES/mL ESCHERICHIA COLI Confirmed Extended Spectrum Beta-Lactamase Producer (ESBL).  In bloodstream infections from ESBL organisms, carbapenems are preferred over piperacillin/tazobactam. They are shown to have a lower risk of mortality.    Report Status 02/09/2022 FINAL  Final   Organism ID, Bacteria ESCHERICHIA COLI (A)  Final      Susceptibility   Escherichia coli - MIC*    AMPICILLIN >=32 RESISTANT Resistant     CEFAZOLIN >=64 RESISTANT Resistant     CEFEPIME >=32 RESISTANT Resistant     CEFTRIAXONE >=64 RESISTANT Resistant     CIPROFLOXACIN >=4 RESISTANT Resistant     GENTAMICIN >=16 RESISTANT Resistant     IMIPENEM <=0.25 SENSITIVE Sensitive     NITROFURANTOIN <=16 SENSITIVE Sensitive     TRIMETH/SULFA >=320 RESISTANT Resistant     AMPICILLIN/SULBACTAM >=32 RESISTANT Resistant     PIP/TAZO >=128 RESISTANT Resistant     * >=100,000 COLONIES/mL ESCHERICHIA COLI    '[x]'$  Treated with cefpodoxime, organism resistant to prescribed antimicrobial  Patient was already on cefpodoxime for treatment of a UTI prior to presentation to the [facility] ED. UCx obtained is growing ESBL+ E.coli.    Plan: Call patient - have him stop taking cefpodoxime. Perform symptom check (suprapubic pain, dysuria, increased urinary frequency, increased urinary urgency, fever/chills, N/V). If patient is improving, prescribe fosfomycin 3 g PO once time dose. If patient is still symptomatic, prescribed fosfomycin 3 g PO every 72 hours x2 doses. No refills.  ED Provider: Mervyn Gay, PA-C  [place] 02/10/2022, 12:45 PM Clinical Pharmacist [phone]

## 2022-02-10 NOTE — Telephone Encounter (Signed)
Post ED Visit - Positive Culture Follow-up: Successful Patient Follow-Up  Culture assessed and recommendations reviewed by:  '[]'$  Elenor Quinones, Pharm.D. '[]'$  Heide Guile, Pharm.D., BCPS AQ-ID '[]'$  Parks Neptune, Pharm.D., BCPS '[]'$  Alycia Rossetti, Pharm.D., BCPS '[]'$  [place], Pharm.D., BCPS, AAHIVP '[]'$  Legrand Como, Pharm.D., BCPS, AAHIVP '[]'$  Salome Arnt, PharmD, BCPS '[]'$  Johnnette Gourd, PharmD, BCPS '[]'$  Hughes Better, PharmD, BCPS '[x]'$  Samul Dada, PharmD  Positive urine culture  '[]'$  Patient discharged without antimicrobial prescription and treatment is now indicated '[x]'$  Organism is resistant to prescribed ED discharge antimicrobial '[]'$  Patient with positive blood cultures  Changes discussed with ED provider: Mervyn Gay PA New antibiotic prescription Fosfomycin 3 gram q 72 hours x 2 doses Called to [facility] [phone]  Contacted patient, date 02/10/22, time 1450 Patient notified of need for change in antibiotic. Instructed to stop Cefpodoxime and start Fosfomcyin. Pt reports continued burning with urination. Fosfomycin 3 gram x two doses called to [facility] on [address].   Sandi Raveling Nevae Pinnix 02/10/2022, 2:57 PM

## 2022-02-14 DIAGNOSIS — R2681 Unsteadiness on feet: Secondary | ICD-10-CM | POA: Diagnosis not present

## 2022-02-14 DIAGNOSIS — R270 Ataxia, unspecified: Secondary | ICD-10-CM | POA: Diagnosis not present

## 2022-02-17 DIAGNOSIS — R2681 Unsteadiness on feet: Secondary | ICD-10-CM | POA: Diagnosis not present

## 2022-02-17 DIAGNOSIS — R270 Ataxia, unspecified: Secondary | ICD-10-CM | POA: Diagnosis not present

## 2022-02-20 DIAGNOSIS — R2681 Unsteadiness on feet: Secondary | ICD-10-CM | POA: Diagnosis not present

## 2022-02-20 DIAGNOSIS — R270 Ataxia, unspecified: Secondary | ICD-10-CM | POA: Diagnosis not present

## 2022-02-21 DIAGNOSIS — N183 Chronic kidney disease, stage 3 unspecified: Secondary | ICD-10-CM | POA: Diagnosis not present

## 2022-02-21 DIAGNOSIS — I2510 Atherosclerotic heart disease of native coronary artery without angina pectoris: Secondary | ICD-10-CM | POA: Diagnosis not present

## 2022-02-21 DIAGNOSIS — E1140 Type 2 diabetes mellitus with diabetic neuropathy, unspecified: Secondary | ICD-10-CM | POA: Diagnosis not present

## 2022-02-21 DIAGNOSIS — K219 Gastro-esophageal reflux disease without esophagitis: Secondary | ICD-10-CM | POA: Diagnosis not present

## 2022-02-21 DIAGNOSIS — I10 Essential (primary) hypertension: Secondary | ICD-10-CM | POA: Diagnosis not present

## 2022-02-21 DIAGNOSIS — E782 Mixed hyperlipidemia: Secondary | ICD-10-CM | POA: Diagnosis not present

## 2022-03-02 DIAGNOSIS — R270 Ataxia, unspecified: Secondary | ICD-10-CM | POA: Diagnosis not present

## 2022-03-02 DIAGNOSIS — R2681 Unsteadiness on feet: Secondary | ICD-10-CM | POA: Diagnosis not present

## 2022-03-06 DIAGNOSIS — N390 Urinary tract infection, site not specified: Secondary | ICD-10-CM | POA: Diagnosis not present

## 2022-03-06 DIAGNOSIS — Z6832 Body mass index (BMI) 32.0-32.9, adult: Secondary | ICD-10-CM | POA: Diagnosis not present

## 2022-03-06 DIAGNOSIS — Z09 Encounter for follow-up examination after completed treatment for conditions other than malignant neoplasm: Secondary | ICD-10-CM | POA: Diagnosis not present

## 2022-03-06 DIAGNOSIS — E162 Hypoglycemia, unspecified: Secondary | ICD-10-CM | POA: Diagnosis not present

## 2022-03-06 DIAGNOSIS — R531 Weakness: Secondary | ICD-10-CM | POA: Diagnosis not present

## 2022-03-08 DIAGNOSIS — R2681 Unsteadiness on feet: Secondary | ICD-10-CM | POA: Diagnosis not present

## 2022-03-08 DIAGNOSIS — R270 Ataxia, unspecified: Secondary | ICD-10-CM | POA: Diagnosis not present

## 2022-03-10 DIAGNOSIS — R2681 Unsteadiness on feet: Secondary | ICD-10-CM | POA: Diagnosis not present

## 2022-03-10 DIAGNOSIS — R270 Ataxia, unspecified: Secondary | ICD-10-CM | POA: Diagnosis not present

## 2022-03-13 DIAGNOSIS — R2681 Unsteadiness on feet: Secondary | ICD-10-CM | POA: Diagnosis not present

## 2022-03-13 DIAGNOSIS — R270 Ataxia, unspecified: Secondary | ICD-10-CM | POA: Diagnosis not present

## 2022-03-16 DIAGNOSIS — R270 Ataxia, unspecified: Secondary | ICD-10-CM | POA: Diagnosis not present

## 2022-03-16 DIAGNOSIS — R2681 Unsteadiness on feet: Secondary | ICD-10-CM | POA: Diagnosis not present

## 2022-03-20 ENCOUNTER — Ambulatory Visit (INDEPENDENT_AMBULATORY_CARE_PROVIDER_SITE_OTHER): Payer: Medicare Other | Admitting: Neurology

## 2022-03-20 ENCOUNTER — Encounter: Payer: Self-pay | Admitting: Neurology

## 2022-03-20 VITALS — BP 146/71 | HR 52 | Ht 65.0 in | Wt 188.0 lb

## 2022-03-20 DIAGNOSIS — M2669 Other specified disorders of temporomandibular joint: Secondary | ICD-10-CM | POA: Diagnosis not present

## 2022-03-20 DIAGNOSIS — R6884 Jaw pain: Secondary | ICD-10-CM | POA: Diagnosis not present

## 2022-03-20 DIAGNOSIS — G453 Amaurosis fugax: Secondary | ICD-10-CM

## 2022-03-20 DIAGNOSIS — R519 Headache, unspecified: Secondary | ICD-10-CM

## 2022-03-20 DIAGNOSIS — R2681 Unsteadiness on feet: Secondary | ICD-10-CM | POA: Diagnosis not present

## 2022-03-20 DIAGNOSIS — Z8673 Personal history of transient ischemic attack (TIA), and cerebral infarction without residual deficits: Secondary | ICD-10-CM | POA: Diagnosis not present

## 2022-03-20 DIAGNOSIS — R270 Ataxia, unspecified: Secondary | ICD-10-CM | POA: Diagnosis not present

## 2022-03-20 DIAGNOSIS — H532 Diplopia: Secondary | ICD-10-CM

## 2022-03-20 NOTE — Progress Notes (Signed)
SLEEP MEDICINE CLINIC    Provider:  Larey Seat, MD  Primary Care Physician:  Lawerance Cruel, MD [place] [address]     Referring Provider: Lawerance Cruel, [place],  [place] [address]          Chief Complaint according to patient   Patient presents with:     New Patient (Initial Visit)           HISTORY OF PRESENT ILLNESS:  Steve Brown is a 82 y.o. male patient who is here for revisit 03/20/2022 for   03-20-2022: Chief concern according to patient :  Diplopia ,  facial and scalp dyseasthesias, pressure sensation, and pain from the temple to the jaw joint on the right side. He reports throbbing and no electric shock sensation , neither spasm.  Jaw feels as if it lock.    Vision is more blurry on the right, but may be in both eyes. He is no longer driving in the dark, not on highways.  He was seen at the [facility] on 03-03-2022. , had a urine test and CMET , GFR is reduced  : creatinine is 1.3 GFR 54. Has Proteinuria. HbA1c was 8.8 (!) Normal lipid profile.  Normal liver function, sciatica treated with oxycodone. glucose. CBC : anemia  Hgb 13/ Hct 38.   I am worried about his history of strokes and cerebrovascular disease, but also about giant cell arteritis.  His right temporal artery was palpable and he localized the tenderness to that spot.       Pt referred by dr  Melinda Crutch, MD following a [facility] ED visit-  ED visit doesn't mention any dizziness, vision changes only back pain-  Referral for ongoing dizziness for the last 3 months. Low blood pressure . Pt reports dizziness while sitting, while driving, especially when overtaken by  larger vehicles or driving over a bridge. . At the time also notices blurred vision.  These are further described as wavy lines, and may be what causes the dizziness sensation. He has an appointment  with ENT / Audiology. Ophthalmologist did reportedly state- eye  health all OK. Dr Harrington Challenger reduced the dose of losartan. BP here today 107/ 61 mmHg. Regular heart rate.   Interval history : 01-18-2021:  Bilateral blindness, transient followed by horizontal diplopia, 11/ 2022.  Here for follow up after normal angio CT in ED. ED visit recently-  turns out ED visit was in November 2022- Followed by CT head and CT angiogram.    18 January 2021:  [name] is seen here as a sleep medicine patient , using his CPAP compliantly . See attached data sheet to today's visit.  The patient endorsed today the fatigue severity score at 38/ 63 points, epworth 4-5 points only. Compliance by days 86% average user time average user time 5 hours and 37 minutes on days used 75% also days he used his machine over 4 hours.  Set pressure at 13 cmH2O with 1 cm EPR, residual AHI is 3.2.  High air leak.  Possibly some central apneas present as well.    This is his last sleep study; IMPRESSION:  1. Complex, mostly Obstructive Sleep Apnea (OSA) a6t AHI of 48.1/  h - high severity.  2. Primary Snoring  3. Some Central and Mixed Sleep Apnea was present.    4. Prolonged Hypoxemia, most severe desaturation during REM  sleep  ( see screen shots)       Review of Systems: Out of a complete 14 system review, the patient complains of only the following symptoms, and all other reviewed systems are negative.:  See above   Social History   Socioeconomic History   Marital status: Single    Spouse name: Not on file   Number of children: 4   Years of education: 52   Highest education level: 12th grade  Occupational History    Employer: RETIRED    Comment: retired  Tobacco Use   Smoking status: Never   Smokeless tobacco: Never  Vaping Use   Vaping Use: Never used  Substance and Sexual Activity   Alcohol use: Not Currently   Drug use: Never   Sexual activity: Not Currently  Other Topics Concern   Not on file  Social History Narrative   Patient lives at home alone and he is  single.     Patient is retired.    Caffeine - one cup daily.   Right handed.   Social Determinants of Health   Financial Resource Strain: Low Risk  (02/10/2019)   Overall Financial Resource Strain (CARDIA)    Difficulty of Paying Living Expenses: Not hard at all  Food Insecurity: No Food Insecurity (09/09/2019)   Hunger Vital Sign    Worried About Running Out of Food in the Last Year: Never true    Ran Out of Food in the Last Year: Never true  Transportation Needs: No Transportation Needs (09/09/2019)   PRAPARE - Hydrologist (Medical): No    Lack of Transportation (Non-Medical): No  Physical Activity: Inactive (09/09/2019)   Exercise Vital Sign    Days of Exercise per Week: 0 days    Minutes of Exercise per Session: 0 min  Stress: Not on file  Social Connections: Not on file    Family History  Problem Relation Age of Onset   Aneurysm Mother    Cancer Father        unknown either pancreatic or prostate   Dementia Sister    Stroke Brother    Heart attack Neg Hx    Sleep apnea Neg Hx     Past Medical History:  Diagnosis Date   Abnormal prostate biopsy    Anticoagulant long-term use    currently xarelto   BPH with elevated PSA    CKD (chronic kidney disease), stage II    Complication of anesthesia    limted neck rom limited use of left arm due to cva   Coronary artery disease    CARDIOLOGIST-  DR Irish Lack--  2010-- PCI w/ stenting midLAD   DDD (degenerative disc disease), lumbar    Degeneration of cervical intervertebral disc    Depression    Diabetes mellitus without complication (HCC)    Dyspnea on exertion    GERD (gastroesophageal reflux disease)    Hemiparesis due to cerebral infarction    History of cerebrovascular accident (CVA) with residual deficit 2002 and 2003--  hemiparisis both sides   per MRI  anterior left frontal lobe, left para midline pons, and inferior cerebullam bilaterally infarcts   History of pulmonary embolus (PE)     06-30-2012  extensive bilaterally   History of recurrent TIAs    History of syncope    hx multiple pre-syncope and syncopal episodes due to vasovagal, orthostatic hypotension, dehydration   History of TIAs    several since 2002   Hyperlipidemia  Hypertension    Mild atherosclerosis of carotid artery, bilateral    per last duplex 11-04-2014  bilateral ICA 1--39%   Neuropathy    fingers   OSA on CPAP    followed by dr Keziyah Kneale--  sev. osa w/ AHI 65.9   Prostate cancer ([place]) dx 2018   Renal insufficiency    S/P coronary artery stent placement 2010   stenting to mid LAD   Simple renal cyst    bilaterally   Stroke [facility])    Trigger finger of both hands 11-17-13   Type 2 diabetes mellitus ([place] [facility]) dx 1986   last one A1c 9.2 on 04-26-2016   Unsteady gait    . Hx prior CVA/TIAs;   Vertebral artery occlusion, left    chronic    Past Surgical History:  Procedure Laterality Date   ANTERIOR CERVICAL DECOMP/DISCECTOMY FUSION  2004   C3 -- C6 limited rom   CARDIAC CATHETERIZATION  06-10-2010   dr Irish Lack   wide patent LAD stent, mid lesion at the origin of the septal prior to the previous stent 40-50%/  normal LVF, ef 55%   CARDIOVASCULAR STRESS TEST  10-23-2012  dr Irish Lack   normal nuclear perfusion study w/ no ischemia/  normal LV function and wall motion , ef 65%   CARPAL TUNNEL RELEASE Bilateral    CATARACT EXTRACTION W/ INTRAOCULAR LENS  IMPLANT, BILATERAL     CHOLECYSTECTOMY N/A 11/02/2015   Procedure: LAPAROSCOPIC CHOLECYSTECTOMY WITH INTRAOPERATIVE CHOLANGIOGRAM;  Surgeon: Donnie Mesa, MD;  Location: [facility];  Service: General;  Laterality: N/A;   COLONOSCOPY     CORONARY ANGIOPLASTY WITH STENT PLACEMENT  02/2008   stenting to mid LAD   GOLD SEED IMPLANT N/A 11/15/2016   Procedure: [place];  Surgeon: Ardis Hughs, MD;  Location[facility];  Service: Urology;  Laterality: N/A;   IR ANGIO INTRA EXTRACRAN SEL COM CAROTID INNOMINATE  BILAT MOD SED  06/13/2018   IR ANGIO VERTEBRAL SEL VERTEBRAL UNI R MOD SED  06/13/2018   IR US GUIDE VASC ACCESS RIGHT  06/13/2018   LEFT HEART CATH AND CORONARY ANGIOGRAPHY N/A 05/25/2017   Procedure: LEFT HEART CATH AND CORONARY ANGIOGRAPHY;  Surgeon: Jettie Booze, MD;  Location: [facility] CV [facility];  Service: Cardiovascular;  Laterality: N/A;   LEFT HEART CATHETERIZATION WITH CORONARY ANGIOGRAM N/A 04/03/2013   Procedure: LEFT HEART CATHETERIZATION WITH CORONARY ANGIOGRAM;  Surgeon: Jettie Booze, MD;  Location[facility] CATH LAB;  Service: Cardiovascular;  Laterality: N/A;  patent mLAD stent  w/ mild disease in remainder LAD and its branches;  mod. focal lesion midLCFx- FFR of lesion was negative for ischemia/  normal LVSF, ef 50%   lungs  2005   "fluid pumped off lungs"   NEUROPLASTY / TRANSPOSITION ULNAR NERVE AT ELBOW Right 2004   PROSTATE BIOPSY N/A 08/31/2016   Procedure: PROSTATE  BIOPSY TRANSRECTAL ULTRASONIC PROSTATE (TUBP);  Surgeon: Ardis Hughs, MD;  Location: [facility];  Service: Urology;  Laterality: N/A;   SPACE OAR INSTILLATION N/A 11/15/2016   Procedure: SPACE OAR INSTILLATION;  Surgeon: Ardis Hughs, MD;  Location: [facility];  Service: Urology;  Laterality: N/A;   TRANSTHORACIC ECHOCARDIOGRAM  04/27/2016   severe focal basal LVH, ef 60-65%,  grade 2 diastoilc dysfunction/  mild AR, MR, and TR/  atrial septum lipomatous hypertrophy/  PASP [id number]   UMBILICAL HERNIA REPAIR       Current Outpatient Medications on File  Prior to Visit  Medication Sig Dispense Refill   albuterol (VENTOLIN HFA) 108 (90 Base) MCG/ACT inhaler Inhale 1-2 puffs into the lungs every 6 (six) hours as needed for wheezing or shortness of breath. 1 each 0   bisacodyl (DULCOLAX) 10 MG suppository Place 1 suppository (10 mg total) rectally daily as needed for moderate constipation. 12 suppository 0   chlorhexidine (PERIDEX) 0.12 % solution as needed. soreness      Cholecalciferol (VITAMIN D-1000 MAX ST) 25 MCG (1000 UT) tablet Take by mouth daily.     cholestyramine light (PREVALITE) 4 g packet Take 1 packet by mouth as needed for constipation.     clobetasol cream (TEMOVATE) 0.05 % Apply topically as needed for rash.     diclofenac Sodium (VOLTAREN) 1 % GEL Apply 4 g topically 4 (four) times daily. 100 g 0   fluticasone (FLONASE) 50 MCG/ACT nasal spray Place 1 spray into both nostrils daily as needed for allergies. 16 g 0   furosemide (LASIX) 20 MG tablet Take 1-2 tablets (20-40 mg total) by mouth daily as needed for fluid. 30 tablet 0   hydrochlorothiazide (MICROZIDE) 12.5 MG capsule Take 1 capsule (12.5 mg total) by mouth daily. 30 capsule 0   HYDROcodone-acetaminophen (NORCO/VICODIN) 5-325 MG tablet Take 1 tablet by mouth every 4 (four) hours as needed. 10 tablet 0   hydrOXYzine (VISTARIL) 25 MG capsule as needed for anxiety.     insulin aspart (NOVOLOG) 100 UNIT/ML FlexPen Inject 5 Units into the skin 3 (three) times daily with meals. 15 mL 0   isosorbide mononitrate (IMDUR) 30 MG 24 hr tablet Take 30 mg by mouth daily.     losartan (COZAAR) 100 MG tablet Take 1 tablet (100 mg total) by mouth daily. (Patient taking differently: Take 50 mg by mouth daily.) 30 tablet 0   meclizine (ANTIVERT) 12.5 MG tablet as needed for dizziness.     methocarbamol (ROBAXIN) 500 MG tablet as needed for pain.     modafinil (PROVIGIL) 100 MG tablet TAKE 1 TABLET BY MOUTH EVERY DAY 30 tablet 5   nitroGLYCERIN (NITROSTAT) 0.4 MG SL tablet as needed.     nystatin (MYCOSTATIN/NYSTOP) powder as needed for pain.     nystatin cream (MYCOSTATIN) Apply topically as needed for rash.     ondansetron (ZOFRAN) 4 MG tablet as needed for nausea/vomiting.     pantoprazole (PROTONIX) 40 MG tablet Take 1 tablet (40 mg total) by mouth every morning. 30 tablet 0   polyethylene glycol (MIRALAX / GLYCOLAX) 17 g packet Take 17 g by mouth daily. 14 each 0   pregabalin (LYRICA) 300 MG capsule  Take 300 mg by mouth 2 (two) times daily.     TRESIBA FLEXTOUCH 100 UNIT/ML FlexTouch Pen Inject 34 Units into the skin daily. 3 mL 0   valACYclovir (VALTREX) 500 MG tablet Take 500 mg by mouth daily.     XARELTO 20 MG TABS tablet Take 1 tablet (20 mg total) by mouth daily. 30 tablet 0   OZEMPIC, 0.25 OR 0.5 MG/DOSE, 2 MG/3ML SOPN once a week. (Patient not taking: Reported on 03/20/2022)     rosuvastatin (CRESTOR) 20 MG tablet TAKE 1 TABLET BY MOUTH EVERY DAY (Patient not taking: Reported on 03/20/2022) 90 tablet 3   sitaGLIPtin (JANUVIA) 50 MG tablet Take 1 tablet (50 mg total) by mouth daily. (Patient not taking: Reported on 03/20/2022) 30 tablet 0   No current facility-administered medications on file prior to visit.    Allergies  Allergen Reactions   Other Other (See Comments)    Per patient- cardiac cath dye-  "woke up during procedure hysterical."   Phenergan [Promethazine] Other (See Comments)    Mood changes    Iohexol Other (See Comments)    Patient refuses contrast after having a hysterical event in hospital //r ls spoke with patient    Iohexol    Phenergan [Promethazine]     Other reaction(s): syncope   Tramadol      DIAGNOSTIC DATA (LABS, IMAGING, TESTING) - I reviewed patient records, labs, notes, testing and imaging myself where available.  Lab Results  Component Value Date   WBC 5.1 02/06/2022   HGB 12.9 (L) 02/06/2022   HCT 38.5 (L) 02/06/2022   MCV 91.9 02/06/2022   PLT 193 02/06/2022      Component Value Date/Time   NA 136 02/06/2022 1334   NA 138 06/07/2020 0000   NA 140 08/14/2012 1513   K 4.1 02/06/2022 1334   K 4.3 08/14/2012 1513   CL 102 02/06/2022 1334   CO2 28 02/06/2022 1334   CO2 27 08/14/2012 1513   GLUCOSE 199 (H) 02/06/2022 1334   GLUCOSE 262 (H) 08/14/2012 1513   BUN 20 02/06/2022 1334   BUN 22 (A) 06/07/2020 0000   BUN 19.0 08/14/2012 1513   CREATININE 1.50 (H) 02/06/2022 1334   CREATININE 1.29 03/19/2014 1130   CREATININE 1.3  08/14/2012 1513   CALCIUM 8.3 (L) 02/06/2022 1334   CALCIUM 9.3 08/14/2012 1513   PROT 7.0 12/05/2021 1519   PROT 6.3 11/24/2019 0944   PROT 6.9 08/14/2012 1513   ALBUMIN 3.7 12/05/2021 1519   ALBUMIN 4.0 11/24/2019 0944   ALBUMIN 3.4 (L) 08/14/2012 1513   AST 34 12/05/2021 1519   AST 15 08/14/2012 1513   ALT 52 (H) 12/05/2021 1519   ALT 14 08/14/2012 1513   ALKPHOS 74 12/05/2021 1519   ALKPHOS 92 08/14/2012 1513   BILITOT 0.5 12/05/2021 1519   BILITOT 0.6 11/24/2019 0944   BILITOT 0.39 08/14/2012 1513   GFRNONAA 47 (L) 02/06/2022 1334   GFRNONAA 67 05/19/2013 1108   GFRAA 57.29 06/07/2020 0000   GFRAA 78 05/19/2013 1108   Lab Results  Component Value Date   CHOL 139 11/24/2019   HDL 53 11/24/2019   LDLCALC 72 11/24/2019   TRIG 70 11/24/2019   CHOLHDL 2.6 11/24/2019   Lab Results  Component Value Date   HGBA1C 7.5 (H) 05/26/2020   Lab Results  Component Value Date   VITAMINB12 480 05/26/2020   Lab Results  Component Value Date   TSH 1.731 05/26/2020   The patient has recently not been compliant with the CPAP but this was not our name incentive for this visit.  I reviewed his [place] records.  He had an MRI of the brain without contrast on 02-06-2022 at that time presented to the emergency room due to syncope and possible left leg weakness.  There was no acute intracranial abnormality multiple old small vessel infarcts of the cerebellum which are known chronic occlusion of the left vertebral artery was confirmed again.  CT of the head without contrast was completely as normal in comparison to the last.  Chronic left cerebellar infarct.  There was also sinusitis noted on the CT.  This should be treated by his primary care physician. PHYSICAL EXAM:  Today's Vitals   03/20/22 1534  BP: (!) 146/71  Pulse: (!) 52  Weight: 188 lb (85.3 kg)  Height: '5\' 5"'$  (1.651 m)  Body mass index is 31.28 kg/m.   Wt Readings from Last 3 Encounters:  03/20/22 188 lb (85.3 kg)  02/06/22  184 lb (83.5 kg)  12/05/21 181 lb (82.1 kg)     Ht Readings from Last 3 Encounters:  03/20/22 '5\' 5"'$  (1.651 m)  02/06/22 '5\' 5"'$  (1.651 m)  12/05/21 '5\' 5"'$  (1.651 m)      General: The patient is awake, alert and appears not in acute distress. The patient is well groomed. Head: Normocephalic, atraumatic. Neck is supple. Body mass index is 33.95 kg/m.   Generalized: Well developed, in no acute distress . Seems depressed.    Pale mucous lining.  No ankle edema , no rash.    Reddened eyes.    Neurological examination  Mentation: Alert oriented to time, place, history taking.        08/23/2020   11:29 AM 07/31/2017   11:21 AM  MMSE - Mini Mental State Exam  Orientation to time 5 5  Orientation to Place 5 5  Registration 3 3  Attention/ Calculation 1 3  Recall 3 2  Language- name 2 objects 2 2  Language- repeat 1 1  Language- follow 3 step command 3 3  Language- read & follow direction 1 1  Write a sentence 1 1  Copy design 0 0  Total score 25 26            02/21/2016    2:06 PM  Montreal Cognitive Assessment   Visuospatial/ Executive (0/5) 3  Naming (0/3) 3  Attention: Read list of digits (0/2) 2  Attention: Read list of letters (0/1) 0  Attention: Serial 7 subtraction starting at 100 (0/3) 0  Language: Repeat phrase (0/2) 2  Language : Fluency (0/1) 1  Abstraction (0/2) 2  Delayed Recall (0/5) 5  Orientation (0/6) 5  Total 23  Adjusted Score (based on education) 24      DIAGNOSTIC DATA (LABS, IMAGING, TESTING) - I reviewed patient records, labs, notes, testing and imaging myself where available.    [facility] results quoted        General: The patient is awake, alert and appears in no acute distress. The patient is well groomed.he appears very concerned, gives a good medical history of events.  CN; equal pupils- arcus senilis, almost un-reactive to light-  from 3 to 53m. status post blepharoplasty-  Patient has diplopia with gaze to the left lower quadrant.  .   Face is symmetric without facial masking-  no nystagmus noted.    Mallampati is class 3.  Tongue protrudes centrally & palate elevates symmetrically.  Neck:  Supple.ring grossly intact.   Speech is clear , mildly hypophonic with no dysarthria noted.   There is no lip, neck or jaw tremor.   Neck is mildly rigid.  Mental Status:  The patient is awake, alert, oriented . His memory, language & knowledge are appropriate.   Sensory:  Fine touch, pinprick, vibration & temperature sensation were decreased at ankle level.  Motor exam:   Normal tone and normal muscle bulk and symmetric normal strength in all extremities.  No resting tremor, no postural or action tremor.  Gait : the patient cannot rise from a chair without bracing himself, he is in PT for that. He walks slowly and with smaller steps, turns to either side 180 degrees with 4.5 steps/ small steps, no drift- with some bilateral arm swing. No tremor. Can toe walk and showed negative Romberg- he feels he is moving but stood  still.    Cerebellar testing:  No dysmetria or intention tremor on finger-to-nose testing.   Arms were extended fully, clearly left sided  pronator drift and drift downwards- has shoulder pain-  There is no truncal or gait ataxia. PS:  ASSESSMENT AND PLAN 82 y.o. year old male  here with: right temporal facial and and jaw pain , Claudication?   I am worried about his history of strokes and cerebrovascular disease, but also about giant cell arteritis.  His right temporal artery was palpable and he localized the tenderness to that spot.  Onset before his last [facility] , 2.23.2024      1) Sed rate, C reactive protein ordered  2) Doppler study for arteritis temporalis. If we can't get one in 3 days, will prefer to do Biopsy direct;y   3) steroid treatment with caveat - DM not well controlled. Alternatives to steroids discussed. There are 2 options.   I plan to follow up either personally or through our NP within 1  months.   I would like to thank Lawerance Cruel, MD and Lawerance Cruel, [place],  [place] [address] for allowing me to meet with and to take care of this pleasant patient.   CC: I will share my notes with PCP .  After spending a total time of  35  minutes face to face and additional time for physical and neurologic examination, review of laboratory studies,  personal review of imaging studies, reports and results of other testing and review of referral information / records as far as provided in visit,   Electronically signed by: Larey Seat, MD 03/20/2022 3:46 PM  [facility] Board certified by The [employer] of Sleep Medicine and Diplomate of the [employer] of Sleep Medicine. Board certified In Neurology through the [place], Fellow of the [employer] of Neurology. Medical Director of [employer].

## 2022-03-20 NOTE — Patient Instructions (Signed)
Virtual visit next months.  Either biopsy or doppler US for temporal artery, treatment by Immune-Supressant. Not steroids .

## 2022-03-21 LAB — C-REACTIVE PROTEIN: CRP: 2 mg/L (ref 0–10)

## 2022-03-21 LAB — SEDIMENTATION RATE: Sed Rate: 14 mm/hr (ref 0–30)

## 2022-03-22 ENCOUNTER — Telehealth: Payer: Self-pay | Admitting: Neurology

## 2022-03-22 DIAGNOSIS — R270 Ataxia, unspecified: Secondary | ICD-10-CM | POA: Diagnosis not present

## 2022-03-22 DIAGNOSIS — R2681 Unsteadiness on feet: Secondary | ICD-10-CM | POA: Diagnosis not present

## 2022-03-22 NOTE — Telephone Encounter (Signed)
Called the patient and reviewed the lab results with him. Advised that he would not need to move forward with a biopsy since she doesn't feel its temporal arteritis. I advised the patient that Dr Brett Fairy would like for him to still move forward with the doppler testing. Pt verbalized understanding. Pt had no questions at this time but was encouraged to call back if questions arise.

## 2022-03-22 NOTE — Telephone Encounter (Signed)
-----   Message from Larey Seat, MD sent at 03/21/2022 12:50 PM EDT ----- No elevation in c-reactive protein or sed rate, no need for steroids at this time. This makes the dx of temporal arteritis much less likely. I will cancel the surgical biopsy, but I like to obtain the doppler study still, as it isn't invasive.

## 2022-03-27 DIAGNOSIS — R270 Ataxia, unspecified: Secondary | ICD-10-CM | POA: Diagnosis not present

## 2022-03-27 DIAGNOSIS — R2681 Unsteadiness on feet: Secondary | ICD-10-CM | POA: Diagnosis not present

## 2022-03-30 DIAGNOSIS — R2681 Unsteadiness on feet: Secondary | ICD-10-CM | POA: Diagnosis not present

## 2022-03-30 DIAGNOSIS — R270 Ataxia, unspecified: Secondary | ICD-10-CM | POA: Diagnosis not present

## 2022-04-03 DIAGNOSIS — R2681 Unsteadiness on feet: Secondary | ICD-10-CM | POA: Diagnosis not present

## 2022-04-03 DIAGNOSIS — R270 Ataxia, unspecified: Secondary | ICD-10-CM | POA: Diagnosis not present

## 2022-04-10 ENCOUNTER — Emergency Department (HOSPITAL_COMMUNITY)
Admission: EM | Admit: 2022-04-10 | Discharge: 2022-04-10 | Discharge status: Against Medical Advice | Payer: Medicare Other | Attending: Emergency Medicine | Admitting: Emergency Medicine

## 2022-04-10 ENCOUNTER — Emergency Department (HOSPITAL_COMMUNITY): Payer: Medicare Other

## 2022-04-10 ENCOUNTER — Encounter (HOSPITAL_COMMUNITY): Payer: Self-pay

## 2022-04-10 DIAGNOSIS — I739 Peripheral vascular disease, unspecified: Secondary | ICD-10-CM | POA: Diagnosis not present

## 2022-04-10 DIAGNOSIS — M79602 Pain in left arm: Secondary | ICD-10-CM | POA: Insufficient documentation

## 2022-04-10 DIAGNOSIS — R0602 Shortness of breath: Secondary | ICD-10-CM | POA: Insufficient documentation

## 2022-04-10 DIAGNOSIS — R5383 Other fatigue: Secondary | ICD-10-CM | POA: Diagnosis not present

## 2022-04-10 DIAGNOSIS — Z5321 Procedure and treatment not carried out due to patient leaving prior to being seen by health care provider: Secondary | ICD-10-CM | POA: Diagnosis not present

## 2022-04-10 DIAGNOSIS — R079 Chest pain, unspecified: Secondary | ICD-10-CM | POA: Diagnosis not present

## 2022-04-10 DIAGNOSIS — L603 Nail dystrophy: Secondary | ICD-10-CM | POA: Diagnosis not present

## 2022-04-10 DIAGNOSIS — M79622 Pain in left upper arm: Secondary | ICD-10-CM | POA: Diagnosis not present

## 2022-04-10 DIAGNOSIS — E1142 Type 2 diabetes mellitus with diabetic polyneuropathy: Secondary | ICD-10-CM | POA: Diagnosis not present

## 2022-04-10 LAB — CBC
HCT: 41 % (ref 39.0–52.0)
Hemoglobin: 13.9 g/dL (ref 13.0–17.0)
MCH: 32 pg (ref 26.0–34.0)
MCHC: 33.9 g/dL (ref 30.0–36.0)
MCV: 94.5 fL (ref 80.0–100.0)
Platelets: 201 10*3/uL (ref 150–400)
RBC: 4.34 MIL/uL (ref 4.22–5.81)
RDW: 12.5 % (ref 11.5–15.5)
WBC: 6.9 10*3/uL (ref 4.0–10.5)
nRBC: 0 % (ref 0.0–0.2)

## 2022-04-10 LAB — BASIC METABOLIC PANEL
Anion gap: 11 (ref 5–15)
BUN: 29 mg/dL — ABNORMAL HIGH (ref 8–23)
CO2: 25 mmol/L (ref 22–32)
Calcium: 9.1 mg/dL (ref 8.9–10.3)
Chloride: 102 mmol/L (ref 98–111)
Creatinine, Ser: 1.47 mg/dL — ABNORMAL HIGH (ref 0.61–1.24)
GFR, Estimated: 48 mL/min — ABNORMAL LOW (ref >60)
Glucose, Bld: 395 mg/dL — ABNORMAL HIGH (ref 70–99)
Potassium: 3.9 mmol/L (ref 3.5–5.1)
Sodium: 138 mmol/L (ref 135–145)

## 2022-04-10 LAB — TROPONIN I (HIGH SENSITIVITY): Troponin I (High Sensitivity): 49 ng/L — ABNORMAL HIGH (ref <18)

## 2022-04-10 NOTE — ED Triage Notes (Signed)
Pt c/o left arm pain from his shoulder down his arm x 1 week. Pt also c/o generalized fatigue and SHOB. Pt denies CP.

## 2022-04-10 NOTE — ED Provider Triage Note (Signed)
Emergency Medicine Provider Triage Evaluation Note  Steve Brown , a 82 y.o. male  was evaluated in triage.  Pt complains of left upper extremity pain and fatigue.  Symptoms started 2 to 3 days ago, they are constant.  He does endorse feeling short of breath but denies any specific chest pain.  No headache, vision changes, lateralized weakness or numbness..  Review of Systems  Per HPI  Physical Exam  BP (!) 170/103 (BP Location: Right Arm)   Pulse 71   Temp 98.1 F (36.7 C) (Oral)   Resp 16   Ht 5\' 5"  (1.651 m)   Wt 86.2 kg   SpO2 100%   BMI 31.62 kg/m  Gen:   Awake, no distress   Resp:  Normal effort  MSK:   Moves extremities without difficulty  Other:  Cranial nerves II through XII are grossly intact.  Upper and lower extremity and symmetric bilaterally.  Upper and lower extremity pulses are symmetric  Medical Decision Making  Medically screening exam initiated at 1:01 PM.  Appropriate orders placed.  Steve Brown was informed that the remainder of the evaluation will be completed by another provider, this initial triage assessment does not replace that evaluation, and the importance of remaining in the ED until their evaluation is complete.    Sherrill Raring, PA-C 04/10/22 1302

## 2022-04-12 ENCOUNTER — Telehealth: Payer: Medicare Other | Admitting: Neurology

## 2022-04-12 DIAGNOSIS — R42 Dizziness and giddiness: Secondary | ICD-10-CM | POA: Diagnosis not present

## 2022-04-12 DIAGNOSIS — M7989 Other specified soft tissue disorders: Secondary | ICD-10-CM | POA: Diagnosis not present

## 2022-04-12 DIAGNOSIS — R519 Headache, unspecified: Secondary | ICD-10-CM | POA: Diagnosis not present

## 2022-04-12 DIAGNOSIS — Z6830 Body mass index (BMI) 30.0-30.9, adult: Secondary | ICD-10-CM | POA: Diagnosis not present

## 2022-04-13 ENCOUNTER — Ambulatory Visit: Payer: Medicare Other | Admitting: Neurology

## 2022-04-13 ENCOUNTER — Encounter: Payer: Self-pay | Admitting: Neurology

## 2022-04-17 DIAGNOSIS — R270 Ataxia, unspecified: Secondary | ICD-10-CM | POA: Diagnosis not present

## 2022-04-17 DIAGNOSIS — R2681 Unsteadiness on feet: Secondary | ICD-10-CM | POA: Diagnosis not present

## 2022-04-18 ENCOUNTER — Ambulatory Visit (INDEPENDENT_AMBULATORY_CARE_PROVIDER_SITE_OTHER): Payer: Medicare Other | Admitting: Vascular Surgery

## 2022-04-18 ENCOUNTER — Encounter: Payer: Self-pay | Admitting: Vascular Surgery

## 2022-04-18 ENCOUNTER — Other Ambulatory Visit: Payer: Self-pay

## 2022-04-18 VITALS — BP 149/78 | HR 58 | Temp 98.1°F | Resp 20 | Ht 65.0 in | Wt 183.9 lb

## 2022-04-18 DIAGNOSIS — M2669 Other specified disorders of temporomandibular joint: Secondary | ICD-10-CM

## 2022-04-18 NOTE — Progress Notes (Signed)
Patient name: Steve Brown MRN: [id number] DOB: 04-May-1940 Sex: male  REASON FOR CONSULT: Temporal artery biopsy  HPI: Steve Brown is a 82 y.o. male, with history of CKD stage II, coronary artery disease, diabetes, PE on Xarelto, hypertension, hyperlipidemia that presents for evaluation of temporal artery biopsy.  He describes about 4 to 5 months of right sided temporal headaches with jaw pain while chewing.  He has been evaluated by his PCP Dr. Tenny Craw who started him on steroids.  He has noted mild improvement on steroids.  He did have an elevated sed rate about 2 months ago of 21.  He denies any left-sided symptoms.  Past Medical History:  Diagnosis Date   Abnormal prostate biopsy    Anticoagulant long-term use    currently xarelto   BPH with elevated PSA    CKD (chronic kidney disease), stage II    Complication of anesthesia    limted neck rom limited use of left arm due to cva   Coronary artery disease    CARDIOLOGIST-  DR Eldridge Dace--  2010-- PCI w/ stenting midLAD   DDD (degenerative disc disease), lumbar    Degeneration of cervical intervertebral disc    Depression    Diabetes mellitus without complication    Dyspnea on exertion    GERD (gastroesophageal reflux disease)    Hemiparesis due to cerebral infarction    History of cerebrovascular accident (CVA) with residual deficit 2002 and 2003--  hemiparisis both sides   per MRI  anterior left frontal lobe, left para midline pons, and inferior cerebullam bilaterally infarcts   History of pulmonary embolus (PE)    06-30-2012  extensive bilaterally   History of recurrent TIAs    History of syncope    hx multiple pre-syncope and syncopal episodes due to vasovagal, orthostatic hypotension, dehydration   History of TIAs    several since 2002   Hyperlipidemia    Hypertension    Mild atherosclerosis of carotid artery, bilateral    per last duplex 11-04-2014  bilateral ICA 1--39%   Neuropathy    fingers   OSA on CPAP     followed by dr dohmeier--  sev. osa w/ AHI 65.9   Prostate cancer dx 2018   Renal insufficiency    S/P coronary artery stent placement 2010   stenting to mid LAD   Simple renal cyst    bilaterally   Stroke    Trigger finger of both hands 11-17-13   Type 2 diabetes mellitus dx 1986   last one A1c 9.2 on 04-26-2016   Unsteady gait    . Hx prior CVA/TIAs;   Vertebral artery occlusion, left    chronic    Past Surgical History:  Procedure Laterality Date   ANTERIOR CERVICAL DECOMP/DISCECTOMY FUSION  2004   C3 -- C6 limited rom   CARDIAC CATHETERIZATION  06-10-2010   dr Eldridge Dace   wide patent LAD stent, mid lesion at the origin of the septal prior to the previous stent 40-50%/  normal LVF, ef 55%   CARDIOVASCULAR STRESS TEST  10-23-2012  dr Eldridge Dace   normal nuclear perfusion study w/ no ischemia/  normal LV function and wall motion , ef 65%   CARPAL TUNNEL RELEASE Bilateral    CATARACT EXTRACTION W/ INTRAOCULAR LENS  IMPLANT, BILATERAL     CHOLECYSTECTOMY N/A 11/02/2015   Procedure: LAPAROSCOPIC CHOLECYSTECTOMY WITH INTRAOPERATIVE CHOLANGIOGRAM;  Surgeon: Manus Rudd, MD;  Location: [facility] OR;  Service: General;  Laterality: N/A;   COLONOSCOPY  CORONARY ANGIOPLASTY WITH STENT PLACEMENT  02/2008   stenting to mid LAD   GOLD SEED IMPLANT N/A 11/15/2016   Procedure: GOLD SEED IMPLANT TIMES THREE;  Surgeon: Crist FatHerrick, Benjamin W, MD;  [facility] [place];  Service: Urology;  Laterality: N/A;   IR ANGIO INTRA EXTRACRAN SEL COM CAROTID INNOMINATE BILAT MOD SED  06/13/2018   IR ANGIO VERTEBRAL SEL VERTEBRAL UNI R MOD SED  06/13/2018   IR US GUIDE VASC ACCESS RIGHT  06/13/2018   LEFT HEART CATH AND CORONARY ANGIOGRAPHY N/A 05/25/2017   Procedure: LEFT HEART CATH AND CORONARY ANGIOGRAPHY;  Surgeon: Corky CraftsVaranasi, Jayadeep S, MD;  [facility];  Service: Cardiovascular;  Laterality: N/A;   LEFT HEART CATHETERIZATION WITH CORONARY ANGIOGRAM N/A 04/03/2013   Procedure: LEFT  HEART CATHETERIZATION WITH CORONARY ANGIOGRAM;  Surgeon: Corky CraftsJayadeep S Varanasi, MD;  [facility];  Service: Cardiovascular;  Laterality: N/A;  patent mLAD stent  w/ mild disease in remainder LAD and its branches;  mod. focal lesion midLCFx- FFR of lesion was negative for ischemia/  normal LVSF, ef 50%   lungs  2005   "fluid pumped off lungs"   NEUROPLASTY / TRANSPOSITION ULNAR NERVE AT ELBOW Right 2004   PROSTATE BIOPSY N/A 08/31/2016   Procedure: PROSTATE  BIOPSY TRANSRECTAL ULTRASONIC PROSTATE (TUBP);  Surgeon: Crist FatHerrick, Benjamin W, MD;  [facility] [place];  Service: Urology;  Laterality: N/A;   SPACE OAR INSTILLATION N/A 11/15/2016   Procedure: SPACE OAR INSTILLATION;  Surgeon: Crist FatHerrick, Benjamin W, MD;  [facility] [place];  Service: Urology;  Laterality: N/A;   TRANSTHORACIC ECHOCARDIOGRAM  04/27/2016   severe focal basal LVH, ef 60-65%,  grade 2 diastoilc dysfunction/  mild AR, MR, and TR/  atrial septum lipomatous hypertrophy/  PASP 42mmHg   UMBILICAL HERNIA REPAIR      Family History  Problem Relation Age of Onset   Aneurysm Mother    Cancer Father        unknown either pancreatic or prostate   Dementia Sister    Stroke Brother    Heart attack Neg Hx    Sleep apnea Neg Hx     SOCIAL HISTORY: Social History   Socioeconomic History   Marital status: Single    Spouse name: Not on file   Number of children: 4   Years of education: 12   Highest education level: 12th grade  Occupational History    Employer: RETIRED    Comment: retired  Tobacco Use   Smoking status: Never    Passive exposure: Never   Smokeless tobacco: Never  Vaping Use   Vaping Use: Never used  Substance and Sexual Activity   Alcohol use: Not Currently   Drug use: Never   Sexual activity: Not Currently  Other Topics Concern   Not on file  Social History Narrative   Patient lives at home alone and he is single.     Patient is retired.    Caffeine - one cup  daily.   Right handed.   Social Determinants of Health   Financial Resource Strain: Low Risk  (02/10/2019)   Overall Financial Resource Strain (CARDIA)    Difficulty of Paying Living Expenses: Not hard at all  Food Insecurity: No Food Insecurity (09/09/2019)   Hunger Vital Sign    Worried About Running Out of Food in the Last Year: Never true    Ran Out of Food in the Last Year: Never true  Transportation Needs: No  Transportation Needs (09/09/2019)   PRAPARE - Administrator, Civil Service (Medical): No    Lack of Transportation (Non-Medical): No  Physical Activity: Inactive (09/09/2019)   Exercise Vital Sign    Days of Exercise per Week: 0 days    Minutes of Exercise per Session: 0 min  Stress: Not on file  Social Connections: Not on file  Intimate Partner Violence: Not on file    Allergies  Allergen Reactions   Other Other (See Comments)    Per patient- cardiac cath dye-  "woke up during procedure hysterical."   Phenergan [Promethazine] Other (See Comments)    Mood changes    Iohexol Other (See Comments)    Patient refuses contrast after having a hysterical event in hospital //r ls spoke with patient    Iohexol    Phenergan [Promethazine]     Other reaction(s): syncope   Tramadol     Current Outpatient Medications  Medication Sig Dispense Refill   albuterol (VENTOLIN HFA) 108 (90 Base) MCG/ACT inhaler Inhale 1-2 puffs into the lungs every 6 (six) hours as needed for wheezing or shortness of breath. 1 each 0   bisacodyl (DULCOLAX) 10 MG suppository Place 1 suppository (10 mg total) rectally daily as needed for moderate constipation. 12 suppository 0   chlorhexidine (PERIDEX) 0.12 % solution as needed. soreness     Cholecalciferol (VITAMIN D-1000 MAX ST) 25 MCG (1000 UT) tablet Take by mouth daily.     cholestyramine light (PREVALITE) 4 g packet Take 1 packet by mouth as needed for constipation.     clobetasol cream (TEMOVATE) 0.05 % Apply topically as needed for  rash.     diclofenac Sodium (VOLTAREN) 1 % GEL Apply 4 g topically 4 (four) times daily. 100 g 0   fluticasone (FLONASE) 50 MCG/ACT nasal spray Place 1 spray into both nostrils daily as needed for allergies. 16 g 0   furosemide (LASIX) 20 MG tablet Take 1-2 tablets (20-40 mg total) by mouth daily as needed for fluid. 30 tablet 0   hydrochlorothiazide (MICROZIDE) 12.5 MG capsule Take 1 capsule (12.5 mg total) by mouth daily. 30 capsule 0   HYDROcodone-acetaminophen (NORCO/VICODIN) 5-325 MG tablet Take 1 tablet by mouth every 4 (four) hours as needed. 10 tablet 0   hydrOXYzine (VISTARIL) 25 MG capsule as needed for anxiety.     insulin aspart (NOVOLOG) 100 UNIT/ML FlexPen Inject 5 Units into the skin 3 (three) times daily with meals. 15 mL 0   isosorbide mononitrate (IMDUR) 30 MG 24 hr tablet Take 30 mg by mouth daily.     losartan (COZAAR) 100 MG tablet Take 1 tablet (100 mg total) by mouth daily. (Patient taking differently: Take 50 mg by mouth daily.) 30 tablet 0   meclizine (ANTIVERT) 12.5 MG tablet as needed for dizziness.     methocarbamol (ROBAXIN) 500 MG tablet as needed for pain.     modafinil (PROVIGIL) 100 MG tablet TAKE 1 TABLET BY MOUTH EVERY DAY 30 tablet 5   nitroGLYCERIN (NITROSTAT) 0.4 MG SL tablet as needed.     nystatin (MYCOSTATIN/NYSTOP) powder as needed for pain.     nystatin cream (MYCOSTATIN) Apply topically as needed for rash.     ondansetron (ZOFRAN) 4 MG tablet as needed for nausea/vomiting.     OZEMPIC, 0.25 OR 0.5 MG/DOSE, 2 MG/3ML SOPN once a week.     pantoprazole (PROTONIX) 40 MG tablet Take 1 tablet (40 mg total) by mouth every morning. 30 tablet 0  polyethylene glycol (MIRALAX / GLYCOLAX) 17 g packet Take 17 g by mouth daily. 14 each 0   pregabalin (LYRICA) 300 MG capsule Take 300 mg by mouth 2 (two) times daily.     rosuvastatin (CRESTOR) 20 MG tablet TAKE 1 TABLET BY MOUTH EVERY DAY 90 tablet 3   sitaGLIPtin (JANUVIA) 50 MG tablet Take 1 tablet (50 mg total)  by mouth daily. 30 tablet 0   TRESIBA FLEXTOUCH 100 UNIT/ML FlexTouch Pen Inject 34 Units into the skin daily. 3 mL 0   valACYclovir (VALTREX) 500 MG tablet Take 500 mg by mouth daily.     XARELTO 20 MG TABS tablet Take 1 tablet (20 mg total) by mouth daily. 30 tablet 0   No current facility-administered medications for this visit.    REVIEW OF SYSTEMS:   denotes positive finding,  denotes negative finding Cardiac  Comments:  Chest pain or chest pressure:    Shortness of breath upon exertion:    Short of breath when lying flat:    Irregular heart rhythm:        Vascular    Pain in calf, thigh, or hip brought on by ambulation:    Pain in feet at night that wakes you up from your sleep:     Blood clot in your veins:    Leg swelling:         Pulmonary    Oxygen at home:    Productive cough:     Wheezing:         Neurologic    Sudden weakness in arms or legs:     Sudden numbness in arms or legs:     Sudden onset of difficulty speaking or slurred speech:    Temporary loss of vision in one eye:     Problems with dizziness:     Jaw claudication x   Gastrointestinal    Blood in stool:     Vomited blood:         Genitourinary    Burning when urinating:     Blood in urine:        Psychiatric    Major depression:         Hematologic    Bleeding problems:    Problems with blood clotting too easily:        Skin    Rashes or ulcers:        Constitutional    Fever or chills:      PHYSICAL EXAM: Vitals:   04/18/22 0852 04/18/22 0856  BP: (!) 145/80 (!) 149/78  Pulse: (!) 58   Resp: 20   Temp: 98.1 F (36.7 C)   TempSrc: Temporal   SpO2: 97%   Weight: 183 lb 14.4 oz (83.4 kg)   Height:  (1.651 m)     GENERAL: The patient is a well-nourished male, in no acute distress. The vital signs are documented above. CARDIAC: There is a regular rate and rhythm.  VASCULAR:  Right temporal pulse palpable Right temporal tenderness on exam PULMONARY: No  respiratory distress. ABDOMEN: Soft and non-tender. MUSCULOSKELETAL: There are no major deformities or cyanosis. NEUROLOGIC: No focal weakness or paresthesias are detected. PSYCHIATRIC: The patient has a normal affect.  DATA:   N/A  Assessment/Plan:  82 y.o. male, with history of CKD stage II, coronary artery disease, diabetes, PE on Xarelto, hypertension, hyperlipidemia that presents for evaluation of temporal artery biopsy.  He describes about 4 to 5 months of right sided temporal headaches  with jaw pain while chewing.  I have recommended a right temporal artery biopsy.  Discussed I can do this Friday in the operating room at [facility]  Discussed this being done under MAC anesthesia with local.  Discussed we will send the biopsy down to the lab and have a pathologist review it.  I discussed long-term treatment of temporal arteritis with steroids.  I discussed small risk of bleeding and wound infection as well as injury to the temporal branch of the facial nerve that sometimes could lead to issues with the eyelid.  He will need to hold his Xarelto.   Cephus Shelling, MD [facility] Office: [phone]

## 2022-04-19 ENCOUNTER — Encounter (HOSPITAL_COMMUNITY): Payer: Self-pay | Admitting: Vascular Surgery

## 2022-04-19 DIAGNOSIS — E7800 Pure hypercholesterolemia, unspecified: Secondary | ICD-10-CM | POA: Diagnosis not present

## 2022-04-19 DIAGNOSIS — I639 Cerebral infarction, unspecified: Secondary | ICD-10-CM | POA: Diagnosis not present

## 2022-04-19 DIAGNOSIS — E1165 Type 2 diabetes mellitus with hyperglycemia: Secondary | ICD-10-CM | POA: Diagnosis not present

## 2022-04-19 DIAGNOSIS — E669 Obesity, unspecified: Secondary | ICD-10-CM | POA: Diagnosis not present

## 2022-04-19 DIAGNOSIS — N189 Chronic kidney disease, unspecified: Secondary | ICD-10-CM | POA: Diagnosis not present

## 2022-04-19 DIAGNOSIS — I10 Essential (primary) hypertension: Secondary | ICD-10-CM | POA: Diagnosis not present

## 2022-04-19 DIAGNOSIS — G609 Hereditary and idiopathic neuropathy, unspecified: Secondary | ICD-10-CM | POA: Diagnosis not present

## 2022-04-19 DIAGNOSIS — C61 Malignant neoplasm of prostate: Secondary | ICD-10-CM | POA: Diagnosis not present

## 2022-04-19 NOTE — Progress Notes (Signed)
PCP - Dr Duane Lope Cardiologist - Dr Lance Muss Neurology - Dr Porfirio Mylar Dohmeier  Chest x-ray - 04/10/22 (2V) EKG - 04/10/22 Stress Test - 08/13/19 ECHO - 05/26/20 Cardiac Cath - 05/25/17  ICD Pacemaker/Loop - n/a  Sleep Study -  Yes CPAP - uses most nights  Diabetes Type 2 Do not take Januvia or Tresiba on the morning of surgery.  Ozempic -no longer taking      THE MORNING OF SURGERY, do not take Novolog Insulin unless your CBG is greater than 220 mg/dL.  If CBG is greater than 220 mg/dL, you may take  of your sliding scale (correction) dose of insulin.  If your blood sugar is less than 70 mg/dL, you will need to treat for low blood sugar: Treat a low blood sugar (less than 70 mg/dL) with  cup of clear juice (cranberry or apple), 4 glucose tablets, OR glucose gel. Recheck blood sugar in 15 minutes after treatment (to make sure it is greater than 70 mg/dL). If your blood sugar is not greater than 70 mg/dL on recheck, call [phone] for further instructions.  Blood Thinner Instructions:  Hold Xarelto 3 days prior to procedure.  Last dose was on 04/18/22.  Aspirin Instructions: n/a  NPO  Anesthesia review: Yes  STOP now taking any Aspirin (unless otherwise instructed by your surgeon), Aleve, Naproxen, Ibuprofen, Motrin, Advil, Goody's, BC's, all herbal medications, fish oil, and all vitamins.   Coronavirus Screening Do you have any of the following symptoms:  Cough yes/no: No Fever (>100.68F)  yes/no: No Runny nose yes/no: No Sore throat yes/no: No Difficulty breathing/shortness of breath  occasional with exertion  Have you traveled in the last 14 days and where? yes/no: No  Patient verbalized understanding of instructions that were given via phone.

## 2022-04-19 NOTE — Anesthesia Preprocedure Evaluation (Addendum)
Anesthesia Evaluation  Patient identified by MRN, date of birth, ID band Patient awake    Reviewed: Allergy & Precautions, NPO status , Patient's Chart, lab work & pertinent test results  Airway Mallampati: III  TM Distance: >3 FB Neck ROM: Full    Dental no notable dental hx.    Pulmonary sleep apnea and Continuous Positive Airway Pressure Ventilation , PE   Pulmonary exam normal        Cardiovascular hypertension, Pt. on medications + CAD and + Cardiac Stents  Normal cardiovascular exam     Neuro/Psych  Headaches PSYCHIATRIC DISORDERS Anxiety Depression   Dementia TIACVA (weakness), Residual Symptoms    GI/Hepatic negative GI ROS, Neg liver ROS,,,  Endo/Other  diabetes, Insulin Dependent    Renal/GU Renal disease     Musculoskeletal  (+) Arthritis ,  Ambulates with cane prn   Abdominal  (+) + obese  Peds  Hematology  (+) Blood dyscrasia (Xarelto)   Anesthesia Other Findings Jaw claudication  Reproductive/Obstetrics                              Anesthesia Physical Anesthesia Plan  ASA: 3  Anesthesia Plan: MAC   Post-op Pain Management:    Induction: Intravenous  PONV Risk Score and Plan: 1 and Ondansetron, Treatment may vary due to age or medical condition, Dexamethasone and Propofol infusion  Airway Management Planned: Nasal Cannula  Additional Equipment:   Intra-op Plan:   Post-operative Plan:   Informed Consent: I have reviewed the patients History and Physical, chart, labs and discussed the procedure including the risks, benefits and alternatives for the proposed anesthesia with the patient or authorized representative who has indicated his/her understanding and acceptance.     Dental advisory given  Plan Discussed with: CRNA  Anesthesia Plan Comments: (PAT note written 04/19/2022 by Shonna Chock, PA-C.  )        Anesthesia Quick Evaluation

## 2022-04-19 NOTE — Progress Notes (Signed)
Anesthesia Chart Review:  Case: [id number] Date/Time: 04/21/22 0903   Procedure: BIOPSY TEMPORAL ARTERY (Right)   Anesthesia type: Choice   Pre-op diagnosis: Jaw claudication   [facility] ROOM 11 / [facility]   Surgeons: Cephus Shellinglark, Christopher J, MD       DISCUSSION: Patient is an 82 year old male scheduled for the above procedure. He was referred by neurologist Dr. Vickey Hugerohmeier for symptoms of right temporal facial and jaw pain. His right temporal artery was palpable with localized tenderness.   History includes never smoker, CAD (LAD stent 2010), HTN, exertional dyspnea, PE (06/30/12), HLD, TIA/CVA (2002, 2003), carotid artery disease (with left vertebral artery occlusion), DM2, CKD, syncope, OSA (uses CPAP), neuropathy, unsteady gait, prostate cancer (s/p radiation, Gold seed implant 11/15/16). For potential anesthesia concerns, he reported limited neck ROM and LUE use related to prior CVA.  Last cardiology visit noted was on 07/18/21 with Dr. Eldridge DaceVaranasi.  Non-obstructive CAD with widely patent LAD stent by [facility] 2019. LVEF 60-65, trivial AI by 05/2020 cath. Known chronic dyspnea. No clear angina. Continued aggressive secondary prevention recommended. One year follow-up recommended.   He reported last Xarelto 04/18/22. He said he is no longer taking Ozempic. He is on Novolog 5 units TID with meals, Januvia 50 mg daily and Tresiba 34 units daily. [facility]   Anesthesia team to evaluate on the day of surgery.    VS:  BP Readings from Last 3 Encounters:  04/18/22 (!) 149/78  04/10/22 (!) 170/103  03/20/22 (!) 146/71   Pulse Readings from Last 3 Encounters:  04/18/22 (!) 58  04/10/22 71  03/20/22 (!) 52     PROVIDERS: Daisy Florooss, Charles Alan, MD is PCP. [facility] is also [facility], saw Anson FretJones, Christopher, MD on 03/05/22. Lance MussVaranasi, Jayadeep, MD is cardiologist Dohmeier, Porfirio Mylararmen, MD is neurologist Dorisann FramesBalan, Bindubal, MD is endocrinologist   LABS: [facility]  [facility]: A1c 8.8% 03/03/22 [facility] Lab Results  Component Value Date   WBC 6.9 04/10/2022   HGB 13.9 04/10/2022   HCT 41.0 04/10/2022   PLT 201 04/10/2022   GLUCOSE 395 (H) 04/10/2022   ALT 52 (H) 12/05/2021   AST 34 12/05/2021   NA 138 04/10/2022   K 3.9 04/10/2022   CL 102 04/10/2022   CREATININE 1.47 (H) 04/10/2022   BUN 29 (H) 04/10/2022   CO2 25 04/10/2022   Lab Results  Component Value Date   CREATININE 1.47 (H) 04/10/2022   CREATININE 1.50 (H) 02/06/2022   CREATININE 1.79 (H) 12/05/2021     IMAGES: CXR 04/10/22: IMPRESSION: Small left pleural effusion or pleural thickening. Otherwise no active cardiopulmonary disease.  MRI brain 02/07/22: IMPRESSION: 1. No acute intracranial abnormality. 2. Multiple old small vessel infarcts of the cerebellum. 3. Chronic occlusion of left vertebral artery.    EKG: 04/10/22: Sinus rhythm Borderline low voltage, extremity leads Abnormal R-wave progression, early transition   CV: Echo 05/26/20: IMPRESSIONS   1. Left ventricular ejection fraction, by estimation, is 60 to 65%. The  left ventricle has normal function. The left ventricle has no regional  wall motion abnormalities. Left ventricular diastolic parameters are  consistent with Grade I diastolic  dysfunction (impaired relaxation).   2. Right ventricular systolic function is normal. The right ventricular  size is normal. There is mildly elevated pulmonary artery systolic  pressure.   3. Left atrial size was mildly dilated.   4. Right atrial size was mildly dilated.   5. The mitral  valve is normal in structure. No evidence of mitral valve  regurgitation. No evidence of mitral stenosis.   6. The aortic valve is normal in structure. There is mild calcification  of the aortic valve. Aortic valve regurgitation is trivial. No aortic  stenosis is present.   7. The inferior vena cava is normal in size with greater than 50%  respiratory variability, suggesting right atrial  pressure of 3 mmHg.    US Carotid 05/26/20: Summary:  Right Carotid: Velocities in the right ICA are consistent with a 1-39% stenosis.  Left Carotid: Velocities in the left ICA are consistent with a 1-39%  stenosis. Non-hemodynamically significant plaque <50% noted in the CCA.  Vertebrals: Bilateral vertebral arteries demonstrate antegrade flow.  Subclavians: Normal flow hemodynamics were seen in bilateral subclavian arteries.    Cardiac cath 05/25/17: Acute Mrg lesion is 25% stenosed. Mid RCA lesion is 25% stenosed. 2nd Mrg lesion is 40% stenosed. Mid Cx lesion is 40% stenosed. Previously placed Mid LAD stent (unknown type) is widely patent. Prox LAD-1 lesion is 25% stenosed. Prox LAD-2 lesion is 25% stenosed. The left ventricular systolic function is normal. LV end diastolic pressure is normal. The left ventricular ejection fraction is 55-65% by visual estimate. There is no aortic valve stenosis. Patent stent.  Nonobstructive disease.  Would investigate noncardiac causes of fatigue.  Past Medical History:  Diagnosis Date   Abnormal prostate biopsy    Anticoagulant long-term use    currently xarelto   Anxiety    BPH with elevated PSA    CKD (chronic kidney disease), stage II    Complication of anesthesia    limted neck rom limited use of left arm due to cva   Coronary artery disease    CARDIOLOGIST-  DR Eldridge Dace--  2010-- PCI w/ stenting midLAD   DDD (degenerative disc disease), lumbar    Degeneration of cervical intervertebral disc    Depression    Diabetes mellitus without complication    Type 2   Dyspnea on exertion    GERD (gastroesophageal reflux disease)    Hemiparesis due to cerebral infarction    History of cerebrovascular accident (CVA) with residual deficit 2002 and 2003--  hemiparisis both sides   per MRI  anterior left frontal lobe, left para midline pons, and inferior cerebullam bilaterally infarcts   History of pulmonary embolus (PE)    06-30-2012   extensive bilaterally   History of recurrent TIAs    History of syncope    hx multiple pre-syncope and syncopal episodes due to vasovagal, orthostatic hypotension, dehydration   History of TIAs    several since 2002   Hyperlipidemia    Hypertension    Mild atherosclerosis of carotid artery, bilateral    per last duplex 11-04-2014  bilateral ICA 1--39%   Neuropathy    fingers   OSA on CPAP    uses most nights; followed by dr dohmeier--  sev. osa w/ AHI 65.9   Pneumonia    x 1   Prostate cancer dx 2018   Renal insufficiency    S/P coronary artery stent placement 2010   stenting to mid LAD   Sensorineural hearing loss, bilateral 04/01/2021   no hearing aids   Simple renal cyst    bilaterally   Stroke    Trigger finger of both hands 11/17/2013   Type 2 diabetes mellitus dx 1986   last one A1c 9.2 on 04-26-2016   Unsteady gait    uses straight cane and occasional walker. Hx  prior CVA/TIAs;   Vertebral artery occlusion, left    chronic    Past Surgical History:  Procedure Laterality Date   ANTERIOR CERVICAL DECOMP/DISCECTOMY FUSION  2004   C3 -- C6 limited rom   CARDIAC CATHETERIZATION  06-10-2010   dr Eldridge Dace   wide patent LAD stent, mid lesion at the origin of the septal prior to the previous stent 40-50%/  normal LVF, ef 55%   CARDIOVASCULAR STRESS TEST  10-23-2012  dr Eldridge Dace   normal nuclear perfusion study w/ no ischemia/  normal LV function and wall motion , ef 65%   CARPAL TUNNEL RELEASE Bilateral    CATARACT EXTRACTION W/ INTRAOCULAR LENS  IMPLANT, BILATERAL     CHOLECYSTECTOMY N/A 11/02/2015   Procedure: LAPAROSCOPIC CHOLECYSTECTOMY WITH INTRAOPERATIVE CHOLANGIOGRAM;  Surgeon: Manus Rudd, MD;  Location: [facility];  Service: General;  Laterality: N/A;   COLONOSCOPY     CORONARY ANGIOPLASTY WITH STENT PLACEMENT  02/2008   stenting to mid LAD   GOLD SEED IMPLANT N/A 11/15/2016   Procedure: GOLD SEED IMPLANT TIMES THREE;  Surgeon: Crist Fat, MD;  Location:  [facility];  Service: Urology;  Laterality: N/A;   IR ANGIO INTRA EXTRACRAN SEL COM CAROTID INNOMINATE BILAT MOD SED  06/13/2018   IR ANGIO VERTEBRAL SEL VERTEBRAL UNI R MOD SED  06/13/2018   IR US GUIDE VASC ACCESS RIGHT  06/13/2018   LEFT HEART CATH AND CORONARY ANGIOGRAPHY N/A 05/25/2017   Procedure: LEFT HEART CATH AND CORONARY ANGIOGRAPHY;  Surgeon: Corky Crafts, MD;  Location: [facility] INVASIVE CV LAB;  Service: Cardiovascular;  Laterality: N/A;   LEFT HEART CATHETERIZATION WITH CORONARY ANGIOGRAM N/A 04/03/2013   Procedure: LEFT HEART CATHETERIZATION WITH CORONARY ANGIOGRAM;  Surgeon: Corky Crafts, MD;  Location: [facility] C[facility];  Service: Cardiovascular;  Laterality: N/A;  patent mLAD stent  w/ mild disease in remainder LAD and its branches;  mod. focal lesion midLCFx- FFR of lesion was negative for ischemia/  normal LVSF, ef 50%   lungs  2005   "fluid pumped off lungs"   NEUROPLASTY / TRANSPOSITION ULNAR NERVE AT ELBOW Right 2004   PROSTATE BIOPSY N/A 08/31/2016   Procedure: PROSTATE  BIOPSY TRANSRECTAL ULTRASONIC PROSTATE (TUBP);  Surgeon: Crist Fat, MD;  Location: [facility];  Service: Urology;  Laterality: N/A;   SPACE OAR INSTILLATION N/A 11/15/2016   Procedure: SPACE OAR INSTILLATION;  Surgeon: Crist Fat, MD;  Location: [facility];  Service: Urology;  Laterality: N/A;   TRANSTHORACIC ECHOCARDIOGRAM  04/27/2016   severe focal basal LVH, ef 60-65%,  grade 2 diastoilc dysfunction/  mild AR, MR, and TR/  atrial septum lipomatous hypertrophy/  PASP   UMBILICAL HERNIA REPAIR      MEDICATIONS: No current facility-administered medications for this encounter.    busPIRone (BUSPAR) 5 MG tablet   escitalopram (LEXAPRO) 20 MG tablet   XARELTO 20 MG TABS tablet   albuterol (VENTOLIN HFA) 108 (90 Base) MCG/ACT inhaler   bisacodyl (DULCOLAX) 10 MG suppository   chlorhexidine (PERIDEX) 0.12 % solution   Cholecalciferol  (VITAMIN D-1000 MAX ST) 25 MCG (1000 UT) tablet   cholestyramine light (PREVALITE) 4 g packet   clobetasol cream (TEMOVATE) 0.05 %   diclofenac Sodium (VOLTAREN) 1 % GEL   fluticasone (FLONASE) 50 MCG/ACT nasal spray   furosemide (LASIX) 20 MG tablet   hydrochlorothiazide (MICROZIDE) 12.5 MG capsule   HYDROcodone-acetaminophen (NORCO/VICODIN) 5-325 MG tablet   hydrOXYzine (VISTARIL) 25 MG capsule  insulin aspart (NOVOLOG) 100 UNIT/ML FlexPen   isosorbide mononitrate (IMDUR) 30 MG 24 hr tablet   losartan (COZAAR) 100 MG tablet   meclizine (ANTIVERT) 12.5 MG tablet   methocarbamol (ROBAXIN) 500 MG tablet   modafinil (PROVIGIL) 100 MG tablet   nitroGLYCERIN (NITROSTAT) 0.4 MG SL tablet   nystatin (MYCOSTATIN/NYSTOP) powder   nystatin cream (MYCOSTATIN)   ondansetron (ZOFRAN) 4 MG tablet   OZEMPIC, 0.25 OR 0.5 MG/DOSE, 2 MG/3ML SOPN   pantoprazole (PROTONIX) 40 MG tablet   polyethylene glycol (MIRALAX / GLYCOLAX) 17 g packet   pregabalin (LYRICA) 300 MG capsule   rosuvastatin (CRESTOR) 20 MG tablet   sitaGLIPtin (JANUVIA) 50 MG tablet   TRESIBA FLEXTOUCH 100 UNIT/ML FlexTouch Pen   valACYclovir (VALTREX) 500 MG tablet    Shonna Chock, PA-C Surgical Short Stay/Anesthesiology [facility] Phone [phone] [facility] Phone [phone] 04/19/2022 12:50 PM

## 2022-04-20 ENCOUNTER — Telehealth: Payer: Self-pay

## 2022-04-20 NOTE — Telephone Encounter (Signed)
Spoke with patient to update on arrival time for surgery of 0530 AM tomorrow at [facility].  Patient verbalized understanding.

## 2022-04-21 ENCOUNTER — Ambulatory Visit (HOSPITAL_BASED_OUTPATIENT_CLINIC_OR_DEPARTMENT_OTHER): Payer: Medicare Other | Admitting: Vascular Surgery

## 2022-04-21 ENCOUNTER — Other Ambulatory Visit: Payer: Self-pay

## 2022-04-21 ENCOUNTER — Encounter (HOSPITAL_COMMUNITY): Payer: Self-pay | Admitting: Vascular Surgery

## 2022-04-21 ENCOUNTER — Ambulatory Visit (HOSPITAL_COMMUNITY)
Admission: RE | Admit: 2022-04-21 | Discharge: 2022-04-21 | Disposition: A | Discharge status: Home / Self Care | Payer: Medicare Other | Attending: Vascular Surgery | Admitting: Vascular Surgery

## 2022-04-21 ENCOUNTER — Ambulatory Visit (HOSPITAL_COMMUNITY): Payer: Medicare Other | Admitting: Vascular Surgery

## 2022-04-21 ENCOUNTER — Encounter (HOSPITAL_COMMUNITY)
Admission: RE | Disposition: A | Discharge status: Home / Self Care | Payer: Self-pay | Source: Home / Self Care | Attending: Vascular Surgery

## 2022-04-21 DIAGNOSIS — M1990 Unspecified osteoarthritis, unspecified site: Secondary | ICD-10-CM | POA: Diagnosis not present

## 2022-04-21 DIAGNOSIS — Z8546 Personal history of malignant neoplasm of prostate: Secondary | ICD-10-CM | POA: Insufficient documentation

## 2022-04-21 DIAGNOSIS — E785 Hyperlipidemia, unspecified: Secondary | ICD-10-CM | POA: Insufficient documentation

## 2022-04-21 DIAGNOSIS — G4733 Obstructive sleep apnea (adult) (pediatric): Secondary | ICD-10-CM | POA: Diagnosis not present

## 2022-04-21 DIAGNOSIS — Z7985 Long-term (current) use of injectable non-insulin antidiabetic drugs: Secondary | ICD-10-CM | POA: Diagnosis not present

## 2022-04-21 DIAGNOSIS — I129 Hypertensive chronic kidney disease with stage 1 through stage 4 chronic kidney disease, or unspecified chronic kidney disease: Secondary | ICD-10-CM | POA: Insufficient documentation

## 2022-04-21 DIAGNOSIS — Z8673 Personal history of transient ischemic attack (TIA), and cerebral infarction without residual deficits: Secondary | ICD-10-CM | POA: Insufficient documentation

## 2022-04-21 DIAGNOSIS — Z923 Personal history of irradiation: Secondary | ICD-10-CM | POA: Insufficient documentation

## 2022-04-21 DIAGNOSIS — R519 Headache, unspecified: Secondary | ICD-10-CM

## 2022-04-21 DIAGNOSIS — M2669 Other specified disorders of temporomandibular joint: Secondary | ICD-10-CM | POA: Diagnosis not present

## 2022-04-21 DIAGNOSIS — Z955 Presence of coronary angioplasty implant and graft: Secondary | ICD-10-CM | POA: Insufficient documentation

## 2022-04-21 DIAGNOSIS — E1122 Type 2 diabetes mellitus with diabetic chronic kidney disease: Secondary | ICD-10-CM | POA: Diagnosis not present

## 2022-04-21 DIAGNOSIS — I2510 Atherosclerotic heart disease of native coronary artery without angina pectoris: Secondary | ICD-10-CM | POA: Insufficient documentation

## 2022-04-21 DIAGNOSIS — I10 Essential (primary) hypertension: Secondary | ICD-10-CM | POA: Diagnosis not present

## 2022-04-21 DIAGNOSIS — N182 Chronic kidney disease, stage 2 (mild): Secondary | ICD-10-CM | POA: Diagnosis not present

## 2022-04-21 DIAGNOSIS — Z7901 Long term (current) use of anticoagulants: Secondary | ICD-10-CM | POA: Diagnosis not present

## 2022-04-21 DIAGNOSIS — F418 Other specified anxiety disorders: Secondary | ICD-10-CM | POA: Diagnosis not present

## 2022-04-21 DIAGNOSIS — E1140 Type 2 diabetes mellitus with diabetic neuropathy, unspecified: Secondary | ICD-10-CM | POA: Diagnosis not present

## 2022-04-21 DIAGNOSIS — E119 Type 2 diabetes mellitus without complications: Secondary | ICD-10-CM | POA: Diagnosis not present

## 2022-04-21 DIAGNOSIS — F0390 Unspecified dementia without behavioral disturbance: Secondary | ICD-10-CM | POA: Insufficient documentation

## 2022-04-21 DIAGNOSIS — R6884 Jaw pain: Secondary | ICD-10-CM | POA: Diagnosis not present

## 2022-04-21 DIAGNOSIS — Z794 Long term (current) use of insulin: Secondary | ICD-10-CM | POA: Diagnosis not present

## 2022-04-21 HISTORY — DX: Anxiety disorder, unspecified: F41.9

## 2022-04-21 HISTORY — PX: ARTERY BIOPSY: SHX891

## 2022-04-21 LAB — POCT I-STAT, CHEM 8
BUN: 21 mg/dL (ref 8–23)
Calcium, Ion: 1.22 mmol/L (ref 1.15–1.40)
Chloride: 101 mmol/L (ref 98–111)
Creatinine, Ser: 1.3 mg/dL — ABNORMAL HIGH (ref 0.61–1.24)
Glucose, Bld: 156 mg/dL — ABNORMAL HIGH (ref 70–99)
HCT: 35 % — ABNORMAL LOW (ref 39.0–52.0)
Hemoglobin: 11.9 g/dL — ABNORMAL LOW (ref 13.0–17.0)
Potassium: 4.3 mmol/L (ref 3.5–5.1)
Sodium: 139 mmol/L (ref 135–145)
TCO2: 30 mmol/L (ref 22–32)

## 2022-04-21 LAB — GLUCOSE, CAPILLARY
Glucose-Capillary: 160 mg/dL — ABNORMAL HIGH (ref 70–99)
Glucose-Capillary: 153 mg/dL — ABNORMAL HIGH (ref 70–99)

## 2022-04-21 SURGERY — BIOPSY TEMPORAL ARTERY
Anesthesia: Monitor Anesthesia Care | Site: Head | Laterality: Right

## 2022-04-21 MED ORDER — INSULIN ASPART 100 UNIT/ML IJ SOLN: 0.0000 [IU] | INTRAMUSCULAR | Status: DC | PRN

## 2022-04-21 MED ORDER — PROPOFOL 500 MG/50ML IV EMUL
INTRAVENOUS | Status: DC | PRN
  Administered 2022-04-21: 100 ug/kg/min via INTRAVENOUS

## 2022-04-21 MED ORDER — LIDOCAINE HCL (PF) 1 % IJ SOLN
INTRAMUSCULAR | Status: AC
  Filled 2022-04-21: qty 30

## 2022-04-21 MED ORDER — FENTANYL CITRATE (PF) 250 MCG/5ML IJ SOLN
INTRAMUSCULAR | Status: AC
  Filled 2022-04-21: qty 5

## 2022-04-21 MED ORDER — CEFAZOLIN SODIUM-DEXTROSE 2-4 GM/100ML-% IV SOLN: 2.0000 g | INTRAVENOUS | Status: DC

## 2022-04-21 MED ORDER — ONDANSETRON HCL 4 MG/2ML IJ SOLN: 4.0000 mg | Freq: Once | INTRAMUSCULAR | Status: DC | PRN

## 2022-04-21 MED ORDER — PROPOFOL 10 MG/ML IV BOLUS
INTRAVENOUS | Status: AC
  Filled 2022-04-21: qty 20

## 2022-04-21 MED ORDER — ONDANSETRON HCL 4 MG/2ML IJ SOLN
INTRAMUSCULAR | Status: AC
  Filled 2022-04-21: qty 2

## 2022-04-21 MED ORDER — AMISULPRIDE (ANTIEMETIC) 5 MG/2ML IV SOLN: 10.0000 mg | Freq: Once | INTRAVENOUS | Status: DC | PRN

## 2022-04-21 MED ORDER — ACETAMINOPHEN 10 MG/ML IV SOLN: 1000.0000 mg | Freq: Once | INTRAVENOUS | Status: DC | PRN

## 2022-04-21 MED ORDER — ORAL CARE MOUTH RINSE: 15.0000 mL | Freq: Once | OROMUCOSAL | Status: AC

## 2022-04-21 MED ORDER — LACTATED RINGERS IV SOLN: INTRAVENOUS | Status: DC | PRN

## 2022-04-21 MED ORDER — CEFAZOLIN SODIUM-DEXTROSE 2-4 GM/100ML-% IV SOLN
INTRAVENOUS | Status: AC
  Filled 2022-04-21: qty 100

## 2022-04-21 MED ORDER — CHLORHEXIDINE GLUCONATE 4 % EX LIQD: 60.0000 mL | Freq: Once | CUTANEOUS | Status: DC

## 2022-04-21 MED ORDER — LIDOCAINE HCL (PF) 1 % IJ SOLN
INTRAMUSCULAR | Status: DC | PRN
  Administered 2022-04-21: 5 mL

## 2022-04-21 MED ORDER — GLYCOPYRROLATE PF 0.2 MG/ML IJ SOSY
PREFILLED_SYRINGE | INTRAMUSCULAR | Status: DC | PRN
  Administered 2022-04-21: .2 mg via INTRAVENOUS

## 2022-04-21 MED ORDER — SODIUM CHLORIDE 0.9 % IV SOLN: INTRAVENOUS | Status: DC

## 2022-04-21 MED ORDER — CHLORHEXIDINE GLUCONATE 0.12 % MT SOLN
OROMUCOSAL | Status: AC
  Administered 2022-04-21: 15 mL via OROMUCOSAL
  Filled 2022-04-21: qty 15

## 2022-04-21 MED ORDER — FENTANYL CITRATE (PF) 250 MCG/5ML IJ SOLN
INTRAMUSCULAR | Status: DC | PRN
  Administered 2022-04-21: 50 ug via INTRAVENOUS

## 2022-04-21 MED ORDER — HYDROCODONE-ACETAMINOPHEN 5-325 MG PO TABS: 1.0000 | ORAL_TABLET | Freq: Four times a day (QID) | ORAL | 0 refills | Status: DC | PRN

## 2022-04-21 MED ORDER — EPHEDRINE SULFATE-NACL 50-0.9 MG/10ML-% IV SOSY
PREFILLED_SYRINGE | INTRAVENOUS | Status: DC | PRN
  Administered 2022-04-21 (×2): 10 mg via INTRAVENOUS

## 2022-04-21 MED ORDER — FENTANYL CITRATE (PF) 100 MCG/2ML IJ SOLN: 25.0000 ug | INTRAMUSCULAR | Status: DC | PRN

## 2022-04-21 MED ORDER — CHLORHEXIDINE GLUCONATE 0.12 % MT SOLN: 15.0000 mL | Freq: Once | OROMUCOSAL | Status: AC

## 2022-04-21 MED ORDER — LIDOCAINE 2% (20 MG/ML) 5 ML SYRINGE
INTRAMUSCULAR | Status: DC | PRN
  Administered 2022-04-21: 100 mg via INTRAVENOUS

## 2022-04-21 MED ORDER — CEFAZOLIN SODIUM-DEXTROSE 2-3 GM-%(50ML) IV SOLR
INTRAVENOUS | Status: DC | PRN
  Administered 2022-04-21: 2 g via INTRAVENOUS

## 2022-04-21 MED ORDER — ONDANSETRON HCL 4 MG/2ML IJ SOLN
INTRAMUSCULAR | Status: DC | PRN
  Administered 2022-04-21: 4 mg via INTRAVENOUS

## 2022-04-21 SURGICAL SUPPLY — 45 items
ADH SKN CLS APL DERMABOND .7 (GAUZE/BANDAGES/DRESSINGS) ×1
BAG COUNTER SPONGE SURGICOUNT (BAG) ×1 IMPLANT
BAG SPNG CNTER NS LX DISP (BAG) ×1
BALL CTTN LRG ABS STRL LF (GAUZE/BANDAGES/DRESSINGS) ×1
CANISTER SUCT 3000ML PPV (MISCELLANEOUS) ×1 IMPLANT
CLIP TI WIDE RED SMALL 6 (CLIP) ×1 IMPLANT
CNTNR URN SCR LID CUP LEK RST (MISCELLANEOUS) ×1 IMPLANT
CONT SPEC 4OZ STRL OR WHT (MISCELLANEOUS) ×1
COTTONBALL LRG STERILE PKG (GAUZE/BANDAGES/DRESSINGS) ×1 IMPLANT
COVER SURGICAL LIGHT HANDLE (MISCELLANEOUS) ×1 IMPLANT
DERMABOND ADVANCED .7 DNX12 (GAUZE/BANDAGES/DRESSINGS) ×1 IMPLANT
DRAPE OPHTHALMIC 77X100 STRL (CUSTOM PROCEDURE TRAY) ×1 IMPLANT
ELECT NDL BLADE 2-5/6 (NEEDLE) ×1 IMPLANT
ELECT NEEDLE BLADE 2-5/6 (NEEDLE) ×1 IMPLANT
ELECT REM PT RETURN 9FT ADLT (ELECTROSURGICAL) ×1
ELECTRODE REM PT RTRN 9FT ADLT (ELECTROSURGICAL) ×1 IMPLANT
GAUZE 4X4 16PLY ~~LOC~~+RFID DBL (SPONGE) ×1 IMPLANT
GEL ULTRASOUND 8.5O AQUASONIC (MISCELLANEOUS) ×1 IMPLANT
GLOVE BIO SURGEON STRL SZ7.5 (GLOVE) ×1 IMPLANT
GLOVE BIOGEL PI IND STRL 8 (GLOVE) ×1 IMPLANT
GOWN STRL REUS W/ TWL LRG LVL3 (GOWN DISPOSABLE) ×1 IMPLANT
GOWN STRL REUS W/ TWL XL LVL3 (GOWN DISPOSABLE) ×1 IMPLANT
GOWN STRL REUS W/TWL LRG LVL3 (GOWN DISPOSABLE) ×1
GOWN STRL REUS W/TWL XL LVL3 (GOWN DISPOSABLE) ×1
KIT BASIN OR (CUSTOM PROCEDURE TRAY) ×1 IMPLANT
KIT TURNOVER KIT B (KITS) ×1 IMPLANT
LOOP VASCULAR MINI 18 RED (MISCELLANEOUS) ×1
NDL HYPO 25GX1X1/2 BEV (NEEDLE) ×1 IMPLANT
NEEDLE HYPO 25GX1X1/2 BEV (NEEDLE) ×1 IMPLANT
NS IRRIG 1000ML POUR BTL (IV SOLUTION) ×1 IMPLANT
PACK GENERAL/GYN (CUSTOM PROCEDURE TRAY) ×1 IMPLANT
PAD ARMBOARD 7.5X6 YLW CONV (MISCELLANEOUS) ×2 IMPLANT
SPIKE FLUID TRANSFER (MISCELLANEOUS) ×1 IMPLANT
SUCTION FRAZIER HANDLE 10FR (MISCELLANEOUS) ×1
SUCTION TUBE FRAZIER 10FR DISP (MISCELLANEOUS) ×1 IMPLANT
SUT MNCRL AB 4-0 PS2 18 (SUTURE) ×1 IMPLANT
SUT PROLENE 6 0 BV (SUTURE) IMPLANT
SUT SILK 3 0 (SUTURE)
SUT SILK 3-0 18XBRD TIE 12 (SUTURE) IMPLANT
SUT VIC AB 3-0 SH 27 (SUTURE) ×1
SUT VIC AB 3-0 SH 27X BRD (SUTURE) ×1 IMPLANT
SYR CONTROL 10ML LL (SYRINGE) ×1 IMPLANT
TOWEL GREEN STERILE (TOWEL DISPOSABLE) ×1 IMPLANT
VASCULAR TIE MINI RED 18IN STL (MISCELLANEOUS) ×1 IMPLANT
WATER STERILE IRR 1000ML POUR (IV SOLUTION) ×1 IMPLANT

## 2022-04-21 NOTE — Transfer of Care (Signed)
Immediate Anesthesia Transfer of Care Note  Patient: Steve Brown  Procedure(s) Performed: BIOPSY TEMPORAL ARTERY (Right: Head)  Patient Location: PACU  Anesthesia Type:General  Level of Consciousness: awake, alert , and oriented  Airway & Oxygen Therapy: Patient Spontanous Breathing  Post-op Assessment: Report given to RN and Post -op Vital signs reviewed and stable  Post vital signs: Reviewed and stable  Last Vitals:  Vitals Value Taken Time  BP 138/87 04/21/22 0901  Temp    Pulse 69 04/21/22 0903  Resp 13 04/21/22 0906  SpO2 100 % 04/21/22 0903  Vitals shown include unvalidated device data.  Last Pain:  Vitals:   04/21/22 0623  TempSrc:   PainSc: 5       Patients Stated Pain Goal: 3 (04/21/22 8280)  Complications: No notable events documented.

## 2022-04-21 NOTE — H&P (Signed)
History and Physical Interval Note:  04/21/2022 7:24 AM  Steve Brown  has presented today for surgery, with the diagnosis of Jaw claudication.  The various methods of treatment have been discussed with the patient and family. After consideration of risks, benefits and other options for treatment, the patient has consented to  Procedure(s): BIOPSY TEMPORAL ARTERY (Right) as a surgical intervention.  The patient's history has been reviewed, patient examined, no change in status, stable for surgery.  I have reviewed the patient's chart and labs.  Questions were answered to the patient's satisfaction.     Cephus Shelling     Patient name: Steve Brown      MRN: [id number]        DOB: [birth year]            Sex: male   REASON FOR CONSULT: Temporal artery biopsy   HPI: Steve Brown is a 82 y.o. male, with history of CKD stage II, coronary artery disease, diabetes, PE on Xarelto, hypertension, hyperlipidemia that presents for evaluation of temporal artery biopsy.  He describes about 4 to 5 months of right sided temporal headaches with jaw pain while chewing.  He has been evaluated by his PCP Dr. Tenny Craw who started him on steroids.  He has noted mild improvement on steroids.  He did have an elevated sed rate about 2 months ago of 21.  He denies any left-sided symptoms.       Past Medical History:  Diagnosis Date   Abnormal prostate biopsy     Anticoagulant long-term use      currently xarelto   BPH with elevated PSA     CKD (chronic kidney disease), stage II     Complication of anesthesia      limted neck rom limited use of left arm due to cva   Coronary artery disease      CARDIOLOGIST-  DR Eldridge Dace--  2010-- PCI w/ stenting midLAD   DDD (degenerative disc disease), lumbar     Degeneration of cervical intervertebral disc     Depression     Diabetes mellitus without complication     Dyspnea on exertion     GERD (gastroesophageal reflux disease)     Hemiparesis due to cerebral  infarction     History of cerebrovascular accident (CVA) with residual deficit 2002 and 2003--  hemiparisis both sides    per MRI  anterior left frontal lobe, left para midline pons, and inferior cerebullam bilaterally infarcts   History of pulmonary embolus (PE)      06-30-2012  extensive bilaterally   History of recurrent TIAs     History of syncope      hx multiple pre-syncope and syncopal episodes due to vasovagal, orthostatic hypotension, dehydration   History of TIAs      several since 2002   Hyperlipidemia     Hypertension     Mild atherosclerosis of carotid artery, bilateral      per last duplex 11-04-2014  bilateral ICA 1--39%   Neuropathy      fingers   OSA on CPAP      followed by dr dohmeier--  sev. osa w/ AHI 65.9   Prostate cancer dx 2018   Renal insufficiency     S/P coronary artery stent placement 2010    stenting to mid LAD   Simple renal cyst      bilaterally   Stroke     Trigger finger of both hands 11-17-13   Type 2 diabetes mellitus  dx 1986    last one A1c 9.2 on 04-26-2016   Unsteady gait      . Hx prior CVA/TIAs;   Vertebral artery occlusion, left      chronic           Past Surgical History:  Procedure Laterality Date   ANTERIOR CERVICAL DECOMP/DISCECTOMY FUSION   2004    C3 -- C6 limited rom   CARDIAC CATHETERIZATION   06-10-2010   dr Eldridge Dace    wide patent LAD stent, mid lesion at the origin of the septal prior to the previous stent 40-50%/  normal LVF, ef 55%   CARDIOVASCULAR STRESS TEST   10-23-2012  dr Eldridge Dace    normal nuclear perfusion study w/ no ischemia/  normal LV function and wall motion , ef 65%   CARPAL TUNNEL RELEASE Bilateral     CATARACT EXTRACTION W/ INTRAOCULAR LENS  IMPLANT, BILATERAL       CHOLECYSTECTOMY N/A 11/02/2015    Procedure: LAPAROSCOPIC CHOLECYSTECTOMY WITH INTRAOPERATIVE CHOLANGIOGRAM;  Surgeon: Manus Rudd, MD;  Location: [facility] OR;  Service: General;  Laterality: N/A;   COLONOSCOPY       CORONARY ANGIOPLASTY WITH  STENT PLACEMENT   02/2008    stenting to mid LAD   GOLD SEED IMPLANT N/A 11/15/2016    Procedure: GOLD SEED IMPLANT TIMES THREE;  Surgeon: Crist Fat, MD;  Location: [facility];  Service: Urology;  Laterality: N/A;   IR ANGIO INTRA EXTRACRAN SEL COM CAROTID INNOMINATE BILAT MOD SED   06/13/2018   IR ANGIO VERTEBRAL SEL VERTEBRAL UNI R MOD SED   06/13/2018   IR US GUIDE VASC ACCESS RIGHT   06/13/2018   LEFT HEART CATH AND CORONARY ANGIOGRAPHY N/A 05/25/2017    Procedure: LEFT HEART CATH AND CORONARY ANGIOGRAPHY;  Surgeon: Corky Crafts, MD;  Location: [facility] INVASIVE CV LAB;  Service: Cardiovascular;  Laterality: N/A;   LEFT HEART CATHETERIZATION WITH CORONARY ANGIOGRAM N/A 04/03/2013    Procedure: LEFT HEART CATHETERIZATION WITH CORONARY ANGIOGRAM;  Surgeon: Corky Crafts, MD;  Location: [facility] CATH LAB;  Service: Cardiovascular;  Laterality: N/A;  patent mLAD stent  w/ mild disease in remainder LAD and its branches;  mod. focal lesion midLCFx- FFR of lesion was negative for ischemia/  normal LVSF, ef 50%   lungs   2005    "fluid pumped off lungs"   NEUROPLASTY / TRANSPOSITION ULNAR NERVE AT ELBOW Right 2004   PROSTATE BIOPSY N/A 08/31/2016    Procedure: PROSTATE  BIOPSY TRANSRECTAL ULTRASONIC PROSTATE (TUBP);  Surgeon: Crist Fat, MD;  Location: [facility];  Service: Urology;  Laterality: N/A;   SPACE OAR INSTILLATION N/A 11/15/2016    Procedure: SPACE OAR INSTILLATION;  Surgeon: Crist Fat, MD;  Location: [facility];  Service: Urology;  Laterality: N/A;   TRANSTHORACIC ECHOCARDIOGRAM   04/27/2016    severe focal basal LVH, ef 60-65%,  grade 2 diastoilc dysfunction/  mild AR, MR, and TR/  atrial septum lipomatous hypertrophy/  PASP   UMBILICAL HERNIA REPAIR               Family History  Problem Relation Age of Onset   Aneurysm Mother     Cancer Father          unknown either pancreatic or prostate   Dementia  Sister     Stroke Brother     Heart attack Neg Hx     Sleep apnea Neg Hx  SOCIAL HISTORY: Social History         Socioeconomic History   Marital status: Single      Spouse name: Not on file   Number of children: 4   Years of education: 12   Highest education level: 12th grade  Occupational History      Employer: RETIRED      Comment: retired  Tobacco Use   Smoking status: Never      Passive exposure: Never   Smokeless tobacco: Never  Vaping Use   Vaping Use: Never used  Substance and Sexual Activity   Alcohol use: Not Currently   Drug use: Never   Sexual activity: Not Currently  Other Topics Concern   Not on file  Social History Narrative    Patient lives at home alone and he is single.      Patient is retired.     Caffeine - one cup daily.    Right handed.    Social Determinants of Health        Financial Resource Strain: Low Risk  (02/10/2019)    Overall Financial Resource Strain (CARDIA)     Difficulty of Paying Living Expenses: Not hard at all  Food Insecurity: No Food Insecurity (09/09/2019)    Hunger Vital Sign     Worried About Running Out of Food in the Last Year: Never true     Ran Out of Food in the Last Year: Never true  Transportation Needs: No Transportation Needs (09/09/2019)    PRAPARE - Therapist, art (Medical): No     Lack of Transportation (Non-Medical): No  Physical Activity: Inactive (09/09/2019)    Exercise Vital Sign     Days of Exercise per Week: 0 days     Minutes of Exercise per Session: 0 min  Stress: Not on file  Social Connections: Not on file  Intimate Partner Violence: Not on file           Allergies  Allergen Reactions   Other Other (See Comments)      Per patient- cardiac cath dye-  "woke up during procedure hysterical."   Phenergan [Promethazine] Other (See Comments)      Mood changes    Iohexol Other (See Comments)      Patient refuses contrast after having a hysterical event in hospital  //r ls spoke with patient    Iohexol     Phenergan [Promethazine]        Other reaction(s): syncope   Tramadol              Current Outpatient Medications  Medication Sig Dispense Refill   albuterol (VENTOLIN HFA) 108 (90 Base) MCG/ACT inhaler Inhale 1-2 puffs into the lungs every 6 (six) hours as needed for wheezing or shortness of breath. 1 each 0   bisacodyl (DULCOLAX) 10 MG suppository Place 1 suppository (10 mg total) rectally daily as needed for moderate constipation. 12 suppository 0   chlorhexidine (PERIDEX) 0.12 % solution as needed. soreness       Cholecalciferol (VITAMIN D-1000 MAX ST) 25 MCG (1000 UT) tablet Take by mouth daily.       cholestyramine light (PREVALITE) 4 g packet Take 1 packet by mouth as needed for constipation.       clobetasol cream (TEMOVATE) 0.05 % Apply topically as needed for rash.       diclofenac Sodium (VOLTAREN) 1 % GEL Apply 4 g topically 4 (four) times daily. 100 g 0  fluticasone (FLONASE) 50 MCG/ACT nasal spray Place 1 spray into both nostrils daily as needed for allergies. 16 g 0   furosemide (LASIX) 20 MG tablet Take 1-2 tablets (20-40 mg total) by mouth daily as needed for fluid. 30 tablet 0   hydrochlorothiazide (MICROZIDE) 12.5 MG capsule Take 1 capsule (12.5 mg total) by mouth daily. 30 capsule 0   HYDROcodone-acetaminophen (NORCO/VICODIN) 5-325 MG tablet Take 1 tablet by mouth every 4 (four) hours as needed. 10 tablet 0   hydrOXYzine (VISTARIL) 25 MG capsule as needed for anxiety.       insulin aspart (NOVOLOG) 100 UNIT/ML FlexPen Inject 5 Units into the skin 3 (three) times daily with meals. 15 mL 0   isosorbide mononitrate (IMDUR) 30 MG 24 hr tablet Take 30 mg by mouth daily.       losartan (COZAAR) 100 MG tablet Take 1 tablet (100 mg total) by mouth daily. (Patient taking differently: Take 50 mg by mouth daily.) 30 tablet 0   meclizine (ANTIVERT) 12.5 MG tablet as needed for dizziness.       methocarbamol (ROBAXIN) 500 MG tablet as needed  for pain.       modafinil (PROVIGIL) 100 MG tablet TAKE 1 TABLET BY MOUTH EVERY DAY 30 tablet 5   nitroGLYCERIN (NITROSTAT) 0.4 MG SL tablet as needed.       nystatin (MYCOSTATIN/NYSTOP) powder as needed for pain.       nystatin cream (MYCOSTATIN) Apply topically as needed for rash.       ondansetron (ZOFRAN) 4 MG tablet as needed for nausea/vomiting.       OZEMPIC, 0.25 OR 0.5 MG/DOSE, 2 MG/3ML SOPN once a week.       pantoprazole (PROTONIX) 40 MG tablet Take 1 tablet (40 mg total) by mouth every morning. 30 tablet 0   polyethylene glycol (MIRALAX / GLYCOLAX) 17 g packet Take 17 g by mouth daily. 14 each 0   pregabalin (LYRICA) 300 MG capsule Take 300 mg by mouth 2 (two) times daily.       rosuvastatin (CRESTOR) 20 MG tablet TAKE 1 TABLET BY MOUTH EVERY DAY 90 tablet 3   sitaGLIPtin (JANUVIA) 50 MG tablet Take 1 tablet (50 mg total) by mouth daily. 30 tablet 0   TRESIBA FLEXTOUCH 100 UNIT/ML FlexTouch Pen Inject 34 Units into the skin daily. 3 mL 0   valACYclovir (VALTREX) 500 MG tablet Take 500 mg by mouth daily.       XARELTO 20 MG TABS tablet Take 1 tablet (20 mg total) by mouth daily. 30 tablet 0    No current facility-administered medications for this visit.      REVIEW OF SYSTEMS:  [X]  denotes positive finding, [ ]  denotes negative finding Cardiac   Comments:  Chest pain or chest pressure:      Shortness of breath upon exertion:      Short of breath when lying flat:      Irregular heart rhythm:             Vascular      Pain in calf, thigh, or hip brought on by ambulation:      Pain in feet at night that wakes you up from your sleep:       Blood clot in your veins:      Leg swelling:              Pulmonary      Oxygen at home:      Productive cough:  Wheezing:              Neurologic      Sudden weakness in arms or legs:       Sudden numbness in arms or legs:       Sudden onset of difficulty speaking or slurred speech:      Temporary loss of vision in one eye:        Problems with dizziness:       Jaw claudication x    Gastrointestinal      Blood in stool:       Vomited blood:              Genitourinary      Burning when urinating:       Blood in urine:             Psychiatric      Major depression:              Hematologic      Bleeding problems:      Problems with blood clotting too easily:             Skin      Rashes or ulcers:             Constitutional      Fever or chills:          PHYSICAL EXAM:     Vitals:    04/18/22 0852 04/18/22 0856  BP: (!) 145/80 (!) 149/78  Pulse: (!) 58    Resp: 20    Temp: 98.1 F (36.7 C)    TempSrc: Temporal    SpO2: 97%    Weight: 183 lb 14.4 oz (83.4 kg)    Height:  (1.651 m)        GENERAL: The patient is a well-nourished male, in no acute distress. The vital signs are documented above. CARDIAC: There is a regular rate and rhythm.  VASCULAR:  Right temporal pulse palpable Right temporal tenderness on exam PULMONARY: No respiratory distress. ABDOMEN: Soft and non-tender. MUSCULOSKELETAL: There are no major deformities or cyanosis. NEUROLOGIC: No focal weakness or paresthesias are detected. PSYCHIATRIC: The patient has a normal affect.   DATA:    N/A   Assessment/Plan:   82 y.o. male, with history of CKD stage II, coronary artery disease, diabetes, PE on Xarelto, hypertension, hyperlipidemia that presents for evaluation of temporal artery biopsy.  He describes about 4 to 5 months of right sided temporal headaches with jaw pain while chewing.  I have recommended a right temporal artery biopsy.  Discussed I can do this Friday in the operating room at [facility].  Discussed this being done under MAC anesthesia with local.  Discussed we will send the biopsy down to the lab and have a pathologist review it.  I discussed long-term treatment of temporal arteritis with steroids.  I discussed small risk of bleeding and wound infection as well as injury to the temporal branch of the  facial nerve that sometimes could lead to issues with the eyelid.  He will need to hold his Xarelto.     Cephus Shelling, MD [facility] Office: [phone]

## 2022-04-21 NOTE — Discharge Instructions (Signed)
Right temporal artery biopsy today.  Can resume Xarelto post-op day 1.  I will call patient with results.

## 2022-04-21 NOTE — Op Note (Signed)
Date: April 21, 2022  Preoperative diagnosis: Concern for temporal arteritis  Postoperative diagnosis: Same  Procedure: Right temporal artery biopsy  Surgeon: Dr. Cephus Shelling, MD  Assistant: OR staff  Indications: 82 year old male that has had 4 to 5 months of right-sided temporal headaches with jaw pain with chewing.  He had an elevated ESR.  There was concern for temporal arteritis.  He presents today for temporal artery biopsy after risks benefits discussed.  Findings: 3 cm specimen of right temporal artery was biopsied and sent to pathology and confirmed with Doppler.  Anesthesia: Local with MAC  Details: Patient was taken to the operating room after informed consent was obtained.  Placed on the operative table supine position.  I did use a pencil Doppler to mark out the course of his temporal artery on the right temporal region.  We had to shave a little bit of his hairline.  This area was then prepped and draped in standard sterile fashion.  I used a 1% lidocaine without epinephrine injected approximately 10 mL for local field block.  Timeout was performed.  Antibiotics were given.  15 blade scalpel was used to make an incision.  Dissected down with Bovie cautery as well as Metzenbaum scissors.  I used spring retractors.  The temporal artery was then identified and this was dissected out with Metzenbaum scissors and branches were controlled with clips.  Much had about a 3 cm specimen this was ligated with a right angle clamps divided and passed off the field to pathology.  Each end of the remaining temporal artery was ligated with 3-0 silk ties.  We got hemostasis in the field.  The incision was irrigated out and closed with 3-0 Vicryl 4-0 Monocryl and Dermabond.  Complication: None  Condition: Stable  Cephus Shelling, MD [facility] Office: [phone]   Cephus Shelling

## 2022-04-21 NOTE — Anesthesia Postprocedure Evaluation (Signed)
Anesthesia Post Note  Patient: Steve Brown  Procedure(s) Performed: BIOPSY TEMPORAL ARTERY (Right: Head)     Patient location during evaluation: PACU Anesthesia Type: MAC Level of consciousness: awake Pain management: pain level controlled Vital Signs Assessment: post-procedure vital signs reviewed and stable Respiratory status: spontaneous breathing, nonlabored ventilation and respiratory function stable Cardiovascular status: blood pressure returned to baseline and stable Postop Assessment: no apparent nausea or vomiting Anesthetic complications: no   No notable events documented.  Last Vitals:  Vitals:   04/21/22 0930 04/21/22 0945  BP: 127/73 130/80  Pulse: 63 63  Resp:  11  Temp:  36.6 C  SpO2:  97%    Last Pain:  Vitals:   04/21/22 0945  TempSrc:   PainSc: 0-No pain                 Odette Watanabe P Kasiah Manka

## 2022-04-24 ENCOUNTER — Telehealth (INDEPENDENT_AMBULATORY_CARE_PROVIDER_SITE_OTHER): Payer: Self-pay | Admitting: Vascular Surgery

## 2022-04-24 DIAGNOSIS — R270 Ataxia, unspecified: Secondary | ICD-10-CM | POA: Diagnosis not present

## 2022-04-24 DIAGNOSIS — R2681 Unsteadiness on feet: Secondary | ICD-10-CM | POA: Diagnosis not present

## 2022-04-24 LAB — SURGICAL PATHOLOGY

## 2022-04-24 NOTE — Telephone Encounter (Signed)
Called and informed patient that his temporal artery biopsy results were negative.  He has follow-up arranged with PCP.  Cephus Shelling, MD [facility] [place] Office: [phone]   Cephus Shelling

## 2022-04-28 DIAGNOSIS — R270 Ataxia, unspecified: Secondary | ICD-10-CM | POA: Diagnosis not present

## 2022-04-28 DIAGNOSIS — R2681 Unsteadiness on feet: Secondary | ICD-10-CM | POA: Diagnosis not present

## 2022-05-01 DIAGNOSIS — Z09 Encounter for follow-up examination after completed treatment for conditions other than malignant neoplasm: Secondary | ICD-10-CM | POA: Diagnosis not present

## 2022-05-01 DIAGNOSIS — Z8546 Personal history of malignant neoplasm of prostate: Secondary | ICD-10-CM | POA: Diagnosis not present

## 2022-05-01 DIAGNOSIS — R413 Other amnesia: Secondary | ICD-10-CM | POA: Diagnosis not present

## 2022-05-01 DIAGNOSIS — Z6831 Body mass index (BMI) 31.0-31.9, adult: Secondary | ICD-10-CM | POA: Diagnosis not present

## 2022-05-01 DIAGNOSIS — R519 Headache, unspecified: Secondary | ICD-10-CM | POA: Diagnosis not present

## 2022-05-02 ENCOUNTER — Emergency Department (HOSPITAL_COMMUNITY)
Admission: EM | Admit: 2022-05-02 | Discharge: 2022-05-02 | Disposition: A | Discharge status: Home / Self Care | Payer: Medicare Other | Attending: Emergency Medicine | Admitting: Emergency Medicine

## 2022-05-02 ENCOUNTER — Emergency Department (HOSPITAL_COMMUNITY): Payer: Medicare Other

## 2022-05-02 ENCOUNTER — Encounter (HOSPITAL_COMMUNITY): Payer: Self-pay

## 2022-05-02 ENCOUNTER — Other Ambulatory Visit: Payer: Self-pay

## 2022-05-02 DIAGNOSIS — E11649 Type 2 diabetes mellitus with hypoglycemia without coma: Secondary | ICD-10-CM | POA: Diagnosis not present

## 2022-05-02 DIAGNOSIS — R231 Pallor: Secondary | ICD-10-CM | POA: Diagnosis not present

## 2022-05-02 DIAGNOSIS — I959 Hypotension, unspecified: Secondary | ICD-10-CM | POA: Diagnosis not present

## 2022-05-02 DIAGNOSIS — R2681 Unsteadiness on feet: Secondary | ICD-10-CM | POA: Diagnosis not present

## 2022-05-02 DIAGNOSIS — R270 Ataxia, unspecified: Secondary | ICD-10-CM | POA: Diagnosis not present

## 2022-05-02 DIAGNOSIS — R42 Dizziness and giddiness: Secondary | ICD-10-CM | POA: Diagnosis not present

## 2022-05-02 DIAGNOSIS — R55 Syncope and collapse: Secondary | ICD-10-CM | POA: Diagnosis not present

## 2022-05-02 DIAGNOSIS — R404 Transient alteration of awareness: Secondary | ICD-10-CM | POA: Diagnosis not present

## 2022-05-02 DIAGNOSIS — E162 Hypoglycemia, unspecified: Secondary | ICD-10-CM

## 2022-05-02 LAB — CBC WITH DIFFERENTIAL/PLATELET
Abs Immature Granulocytes: 0.01 10*3/uL (ref 0.00–0.07)
Basophils Absolute: 0 10*3/uL (ref 0.0–0.1)
Basophils Relative: 1 %
Eosinophils Absolute: 0 10*3/uL (ref 0.0–0.5)
Eosinophils Relative: 1 %
HCT: 35.6 % — ABNORMAL LOW (ref 39.0–52.0)
Hemoglobin: 11.9 g/dL — ABNORMAL LOW (ref 13.0–17.0)
Immature Granulocytes: 0 %
Lymphocytes Relative: 35 %
Lymphs Abs: 1.3 10*3/uL (ref 0.7–4.0)
MCH: 31.3 pg (ref 26.0–34.0)
MCHC: 33.4 g/dL (ref 30.0–36.0)
MCV: 93.7 fL (ref 80.0–100.0)
Monocytes Absolute: 0.4 10*3/uL (ref 0.1–1.0)
Monocytes Relative: 11 %
Neutro Abs: 2 10*3/uL (ref 1.7–7.7)
Neutrophils Relative %: 52 %
Platelets: 144 10*3/uL — ABNORMAL LOW (ref 150–400)
RBC: 3.8 MIL/uL — ABNORMAL LOW (ref 4.22–5.81)
RDW: 12.7 % (ref 11.5–15.5)
WBC: 3.8 10*3/uL — ABNORMAL LOW (ref 4.0–10.5)
nRBC: 0 % (ref 0.0–0.2)

## 2022-05-02 LAB — CBG MONITORING, ED
Glucose-Capillary: 53 mg/dL — ABNORMAL LOW (ref 70–99)
Glucose-Capillary: 54 mg/dL — ABNORMAL LOW (ref 70–99)
Glucose-Capillary: 79 mg/dL (ref 70–99)
Glucose-Capillary: 140 mg/dL — ABNORMAL HIGH (ref 70–99)

## 2022-05-02 LAB — BASIC METABOLIC PANEL
Anion gap: 7 (ref 5–15)
BUN: 21 mg/dL (ref 8–23)
CO2: 30 mmol/L (ref 22–32)
Calcium: 8.4 mg/dL — ABNORMAL LOW (ref 8.9–10.3)
Chloride: 102 mmol/L (ref 98–111)
Creatinine, Ser: 1.41 mg/dL — ABNORMAL HIGH (ref 0.61–1.24)
GFR, Estimated: 50 mL/min — ABNORMAL LOW (ref >60)
Glucose, Bld: 123 mg/dL — ABNORMAL HIGH (ref 70–99)
Potassium: 3.8 mmol/L (ref 3.5–5.1)
Sodium: 139 mmol/L (ref 135–145)

## 2022-05-02 LAB — TROPONIN I (HIGH SENSITIVITY)
Troponin I (High Sensitivity): 31 ng/L — ABNORMAL HIGH (ref <18)
Troponin I (High Sensitivity): 29 ng/L — ABNORMAL HIGH (ref <18)

## 2022-05-02 NOTE — ED Triage Notes (Signed)
BIB EMS from PT, pt had syncopal episode during PT, pt did not fall due to sitting in a walker. Pt was having dizziness prior to episode.  Upon EMS arriving pt was hypotensive and diaphoretic. Received 500NS bolus with EMS

## 2022-05-02 NOTE — ED Notes (Signed)
Pt BS remains 54, more juice given and another sandwich

## 2022-05-02 NOTE — ED Notes (Signed)
Pt BS 79, pt given italian ice to maintain BS

## 2022-05-02 NOTE — ED Notes (Signed)
Pt denies dizziness during orthostatic vitals

## 2022-05-02 NOTE — ED Notes (Signed)
Pt c/o being shakey, BS checked and was 53. Pt given juice and sandwich

## 2022-05-02 NOTE — ED Provider Notes (Signed)
Patient signed out to me by Dr. Rodena Medin.  Head CT is normal.  Patient has been having positive hypoglycemia.  Blood sugar now has improved.  I have offered to give patient a tray of food here he states he would like to check his blood sugars at home and eat there and would like to leave.  He is stable for discharge   Lorre Nick, MD 05/02/22 [phone]

## 2022-05-02 NOTE — ED Provider Notes (Signed)
[place] EMERGENCY DEPARTMENT AT [facility] Provider Note   CSN: [id number] Arrival date & time: 05/02/22  1209     History  Chief Complaint  Patient presents with   Loss of Consciousness    Steve Brown is a 82 y.o. male.  82 year old male with prior medical history as detailed below presents for evaluation.  Patient was at physical therapy earlier today.  He was doing his typical exercises.  Apparently had a brief spell of dizziness and may have had a brief near syncopal event.  Patient denies associated chest pain or shortness of breath.  Patient was sent to the ED from physical therapy for evaluation.  Patient denies current symptoms on evaluation.  Patient is in PT to help correct deficits related to prior CVA.   The history is provided by the patient and medical records.       Home Medications Prior to Admission medications   Medication Sig Start Date End Date Taking? Authorizing Provider  albuterol (VENTOLIN HFA) 108 (90 Base) MCG/ACT inhaler Inhale 1-2 puffs into the lungs every 6 (six) hours as needed for wheezing or shortness of breath. 09/06/20   Ward, Tylene Fantasia, PA-C  bisacodyl (DULCOLAX) 10 MG suppository Place 1 suppository (10 mg total) rectally daily as needed for moderate constipation. 05/31/20   Lanae Boast, MD  busPIRone (BUSPAR) 5 MG tablet Take 5 mg by mouth daily.    [provider]  chlorhexidine (PERIDEX) 0.12 % solution Use as directed 5 mLs in the mouth or throat as needed (mouth pain).    [provider]  Cholecalciferol (VITAMIN D-1000 MAX ST) 25 MCG (1000 UT) tablet Take 1,000 Units by mouth daily.    [provider]  cholestyramine light (PREVALITE) 4 g packet Take 1 packet by mouth daily as needed for constipation. 05/23/21   [provider]  clobetasol cream (TEMOVATE) 0.05 % Apply 1 Application topically daily as needed for rash. 05/31/21   [provider]  diclofenac Sodium (VOLTAREN) 1 %  GEL Apply 4 g topically 4 (four) times daily. Patient taking differently: Apply 4 g topically 2 (two) times daily as needed (joint pain). 09/03/19   Melene Plan, DO  escitalopram (LEXAPRO) 20 MG tablet Take 20 mg by mouth at bedtime.    [provider]  fluticasone (FLONASE) 50 MCG/ACT nasal spray Place 1 spray into both nostrils daily as needed for allergies. 06/13/20   Medina-Vargas, Monina C, NP  furosemide (LASIX) 20 MG tablet Take 1-2 tablets (20-40 mg total) by mouth daily as needed for fluid. 06/13/20   Medina-Vargas, Monina C, NP  hydrochlorothiazide (MICROZIDE) 12.5 MG capsule Take 1 capsule (12.5 mg total) by mouth daily. 06/13/20   Medina-Vargas, Monina C, NP  HYDROcodone-acetaminophen (NORCO/VICODIN) 5-325 MG tablet Take 1 tablet by mouth every 6 (six) hours as needed for moderate pain. 04/21/22 04/21/23  Cephus Shelling, MD  hydrOXYzine (VISTARIL) 25 MG capsule Take 25 mg by mouth every 8 (eight) hours as needed for anxiety.    [provider]  insulin aspart (NOVOLOG) 100 UNIT/ML FlexPen Inject 5 Units into the skin 3 (three) times daily with meals. 06/13/20   Medina-Vargas, Monina C, NP  isosorbide mononitrate (IMDUR) 30 MG 24 hr tablet Take 30 mg by mouth daily. 05/31/21   [provider]  losartan (COZAAR) 25 MG tablet Take 25 mg by mouth daily.    [provider]  meclizine (ANTIVERT) 12.5 MG tablet Take 12.5 mg by mouth 2 (two) times  daily as needed for dizziness. 04/25/21   [provider]  methocarbamol (ROBAXIN) 500 MG tablet Take 500 mg by mouth every 8 (eight) hours as needed for pain or muscle spasms.    [provider]  modafinil (PROVIGIL) 100 MG tablet TAKE 1 TABLET BY MOUTH EVERY DAY 06/29/21   Dohmeier, Porfirio Mylar, MD  nitroGLYCERIN (NITROSTAT) 0.4 MG SL tablet Place 0.4 mg under the tongue every 5 (five) minutes as needed for chest pain.    [provider]  nystatin (MYCOSTATIN/NYSTOP) powder Apply 1 Application topically  as needed (irritation).    [provider]  nystatin cream (MYCOSTATIN) Apply 1 Application topically as needed for rash. 06/15/21   [provider]  ondansetron (ZOFRAN) 4 MG tablet Take 4 mg by mouth every 8 (eight) hours as needed for nausea/vomiting, nausea or vomiting.    [provider]  pantoprazole (PROTONIX) 40 MG tablet Take 1 tablet (40 mg total) by mouth every morning. 06/13/20   Medina-Vargas, Monina C, NP  polyethylene glycol (MIRALAX / GLYCOLAX) 17 g packet Take 17 g by mouth daily. Patient taking differently: Take 17 g by mouth daily as needed for moderate constipation. 05/31/20   Lanae Boast, MD  pregabalin (LYRICA) 300 MG capsule Take 300 mg by mouth 2 (two) times daily. 04/26/20   [provider]  rosuvastatin (CRESTOR) 20 MG tablet TAKE 1 TABLET BY MOUTH EVERY DAY 11/02/21   Corky Crafts, MD  sitaGLIPtin (JANUVIA) 50 MG tablet Take 1 tablet (50 mg total) by mouth daily. 06/13/20   Medina-Vargas, Monina C, NP  TRESIBA FLEXTOUCH 100 UNIT/ML FlexTouch Pen Inject 34 Units into the skin daily. Patient taking differently: Inject 36 Units into the skin daily. 06/13/20   Medina-Vargas, Monina C, NP  valACYclovir (VALTREX) 500 MG tablet Take 500 mg by mouth daily as needed (cold sores). 01/25/21   [provider]  XARELTO 20 MG TABS tablet Take 1 tablet (20 mg total) by mouth daily. 06/13/20   Medina-Vargas, Monina C, NP      Allergies    Other, Phenergan [promethazine], Iohexol, Phenergan [promethazine], and Tramadol    Review of Systems   Review of Systems  All other systems reviewed and are negative.   Physical Exam Updated Vital Signs BP (!) 150/83   Pulse (!) 50   Temp 97.6 F (36.4 C) (Oral)   Resp 20   Ht  (1.651 m)   Wt 83.5 kg   SpO2 97%   BMI 30.62 kg/m  Physical Exam Vitals and nursing note reviewed.  Constitutional:      General: He is not in acute distress.    Appearance: Normal appearance. He is  well-developed.  HENT:     Head: Normocephalic and atraumatic.  Eyes:     Conjunctiva/sclera: Conjunctivae normal.     Pupils: Pupils are equal, round, and reactive to light.  Cardiovascular:     Rate and Rhythm: Normal rate and regular rhythm.     Heart sounds: Normal heart sounds.  Pulmonary:     Effort: Pulmonary effort is normal. No respiratory distress.     Breath sounds: Normal breath sounds.  Abdominal:     General: There is no distension.     Palpations: Abdomen is soft.     Tenderness: There is no abdominal tenderness.  Musculoskeletal:        General: No deformity. Normal range of motion.     Cervical back: Normal range of motion and neck supple.  Skin:  General: Skin is warm and dry.  Neurological:     General: No focal deficit present.     Mental Status: He is alert and oriented to person, place, and time. Mental status is at baseline.     Cranial Nerves: No cranial nerve deficit.     Sensory: No sensory deficit.     Motor: No weakness.     ED Results / Procedures / Treatments   Labs (all labs ordered are listed, but only abnormal results are displayed) Labs Reviewed  CBC WITH DIFFERENTIAL/PLATELET - Abnormal; Notable for the following components:      Result Value   WBC 3.8 (*)    RBC 3.80 (*)    Hemoglobin 11.9 (*)    HCT 35.6 (*)    Platelets 144 (*)    All other components within normal limits  BASIC METABOLIC PANEL - Abnormal; Notable for the following components:   Glucose, Bld 123 (*)    Creatinine, Ser 1.41 (*)    Calcium 8.4 (*)    GFR, Estimated 50 (*)    All other components within normal limits  TROPONIN I (HIGH SENSITIVITY) - Abnormal; Notable for the following components:   Troponin I (High Sensitivity) 31 (*)    All other components within normal limits  TROPONIN I (HIGH SENSITIVITY) - Abnormal; Notable for the following components:   Troponin I (High Sensitivity) 29 (*)    All other components within normal limits    EKG EKG  Interpretation  Date/Time:  Tuesday May 02 2022 12:26:03 EDT Ventricular Rate:  49 PR Interval:  180 QRS Duration: 92 QT Interval:  464 QTC Calculation: 419 R Axis:   -7 Text Interpretation: Sinus bradycardia RSR' in V1 or V2, right VCD or RVH Minimal ST elevation, anterior leads Confirmed by Kristine Royal [phone]) on 05/02/2022 1:06:05 PM  Radiology No results found.  Procedures Procedures    Medications Ordered in ED Medications - No data to display  ED Course/ Medical Decision Making/ A&P                             Medical Decision Making Amount and/or Complexity of Data Reviewed Labs: ordered. Radiology: ordered.    Medical Screen Complete  This patient presented to the ED with complaint of dizziness/ near syncope.  This complaint involves an extensive number of treatment options. The initial differential diagnosis includes, but is not limited to, vasovagal episode, metabolic abnormality, etc.   This presentation is: Acute, Chronic, Self-Limited, Previously Undiagnosed, Uncertain Prognosis, Complicated, Systemic Symptoms, and Threat to Life/Bodily Function  Patient presented for evaluation after a brief near syncopal versus possible brief syncopal episode that occurred during physical therapy.  On arrival to the ED patient is comfortable and without complaint.  Patient's exam is without significant findings.  CBC without significant abnormality.  BMP is without acute change.  Troponin x 2 is downward trending and consistent with prior obtained results.  EKG does not extend just acute ischemia.  Orthostatic vital signs are without significant abnormality.  Patient denied symptoms during orthostatic vital sign check.  CT head pending.  Dr. Freida Busman aware of pending study and disposition.     Additional history obtained:  External records from outside sources obtained and reviewed including prior ED visits and prior Inpatient records.    Lab Tests:  I  ordered and personally interpreted labs.  The pertinent results include: BC, BMP, troponin x 2   Imaging Studies  ordered:  I ordered imaging studies including CT head  I agree with the radiologist interpretation.   Cardiac Monitoring:  The patient was maintained on a cardiac monitor.  I personally viewed and interpreted the cardiac monitor which showed an underlying rhythm of: NSR  Problem List / ED Course:  Syncope versus near syncope   Reevaluation:  After the interventions noted above, I reevaluated the patient and found that they have: resolved   Disposition:  After consideration of the diagnostic results and the patients response to treatment, I feel that the patent would benefit from outpatient follow-up.          Final Clinical Impression(s) / ED Diagnoses Final diagnoses:  Near syncope    Rx / DC Orders ED Discharge Orders     None         Wynetta Fines, MD 05/02/22 1554

## 2022-05-02 NOTE — Discharge Instructions (Addendum)
Return for any problem.  ?

## 2022-05-02 NOTE — ED Notes (Signed)
Pt reported he felt "jittery" and requested his blood sugar to be taken. CBG 53. Dr. Freida Busman notified. Pt given cup of orange juice and [place] sandwich.

## 2022-05-04 DIAGNOSIS — I10 Essential (primary) hypertension: Secondary | ICD-10-CM | POA: Diagnosis not present

## 2022-05-04 DIAGNOSIS — E782 Mixed hyperlipidemia: Secondary | ICD-10-CM | POA: Diagnosis not present

## 2022-05-04 DIAGNOSIS — N183 Chronic kidney disease, stage 3 unspecified: Secondary | ICD-10-CM | POA: Diagnosis not present

## 2022-05-04 DIAGNOSIS — E1140 Type 2 diabetes mellitus with diabetic neuropathy, unspecified: Secondary | ICD-10-CM | POA: Diagnosis not present

## 2022-05-07 ENCOUNTER — Emergency Department (HOSPITAL_BASED_OUTPATIENT_CLINIC_OR_DEPARTMENT_OTHER)
Admission: EM | Admit: 2022-05-07 | Discharge: 2022-05-07 | Disposition: A | Discharge status: Home / Self Care | Payer: Medicare Other | Attending: Emergency Medicine | Admitting: Emergency Medicine

## 2022-05-07 ENCOUNTER — Encounter (HOSPITAL_BASED_OUTPATIENT_CLINIC_OR_DEPARTMENT_OTHER): Payer: Self-pay | Admitting: Emergency Medicine

## 2022-05-07 ENCOUNTER — Other Ambulatory Visit: Payer: Self-pay

## 2022-05-07 DIAGNOSIS — I2510 Atherosclerotic heart disease of native coronary artery without angina pectoris: Secondary | ICD-10-CM | POA: Insufficient documentation

## 2022-05-07 DIAGNOSIS — Z7901 Long term (current) use of anticoagulants: Secondary | ICD-10-CM | POA: Insufficient documentation

## 2022-05-07 DIAGNOSIS — G8929 Other chronic pain: Secondary | ICD-10-CM | POA: Diagnosis not present

## 2022-05-07 DIAGNOSIS — Z794 Long term (current) use of insulin: Secondary | ICD-10-CM | POA: Insufficient documentation

## 2022-05-07 DIAGNOSIS — Z7984 Long term (current) use of oral hypoglycemic drugs: Secondary | ICD-10-CM | POA: Diagnosis not present

## 2022-05-07 DIAGNOSIS — E1122 Type 2 diabetes mellitus with diabetic chronic kidney disease: Secondary | ICD-10-CM | POA: Diagnosis not present

## 2022-05-07 DIAGNOSIS — M549 Dorsalgia, unspecified: Secondary | ICD-10-CM | POA: Diagnosis present

## 2022-05-07 DIAGNOSIS — M5442 Lumbago with sciatica, left side: Secondary | ICD-10-CM | POA: Diagnosis not present

## 2022-05-07 DIAGNOSIS — N189 Chronic kidney disease, unspecified: Secondary | ICD-10-CM | POA: Insufficient documentation

## 2022-05-07 MED ORDER — HYDROCODONE-ACETAMINOPHEN 5-325 MG PO TABS: 1.0000 | ORAL_TABLET | ORAL | 0 refills | Status: DC | PRN

## 2022-05-07 MED ORDER — CYCLOBENZAPRINE HCL 5 MG PO TABS
5.0000 mg | ORAL_TABLET | Freq: Once | ORAL | Status: DC
  Filled 2022-05-07: qty 1

## 2022-05-07 MED ORDER — LIDOCAINE 5 % EX PTCH
1.0000 | MEDICATED_PATCH | CUTANEOUS | Status: DC
  Administered 2022-05-07: 1 via TRANSDERMAL
  Filled 2022-05-07: qty 1

## 2022-05-07 MED ORDER — KETOROLAC TROMETHAMINE 60 MG/2ML IM SOLN
30.0000 mg | Freq: Once | INTRAMUSCULAR | Status: AC
  Administered 2022-05-07: 30 mg via INTRAMUSCULAR
  Filled 2022-05-07: qty 2

## 2022-05-07 MED ORDER — HYDROCODONE-ACETAMINOPHEN 5-325 MG PO TABS
1.0000 | ORAL_TABLET | Freq: Once | ORAL | Status: DC
  Filled 2022-05-07: qty 1

## 2022-05-07 MED ORDER — CYCLOBENZAPRINE HCL 10 MG PO TABS: 10.0000 mg | ORAL_TABLET | Freq: Two times a day (BID) | ORAL | 0 refills | Status: DC | PRN

## 2022-05-07 NOTE — Discharge Instructions (Signed)
Recommend continued outpatient pain control, follow-up outpatient with neurosurgery and your PCP regarding your chronic back pain.

## 2022-05-07 NOTE — ED Provider Notes (Signed)
[place] EMERGENCY DEPARTMENT AT [facility] Provider Note   CSN: [id number] Arrival date & time: 05/07/22  1110     History  Chief Complaint  Patient presents with   Back Pain   Leg Pain    Steve Brown is a 82 y.o. male.   Back Pain Associated symptoms: leg pain   Leg Pain Associated symptoms: back pain      82 year old male with medical history significant for DM 2, CAD, recurrent TIAs, left-sided hemiparesis from old stroke, chronic back pain, CKD, presenting to the emergency department with back pain and radiculopathy.  The patient states that he has had chronic back pain which flared up around April 1 and has been present radiating from his left flank down to his left leg.  He has chronic left lower extremity weakness.  He denies any new weakness or numbness.  He denies any saddle anesthesia.  He denies any recent falls or trauma.  He denies any urinary or fecal incontinence, history of IV drug abuse.  He states that he has been managing his back pain with Norco at home but is almost out.  He denies any urinary symptoms.  No fevers or chills.  Home Medications Prior to Admission medications   Medication Sig Start Date End Date Taking? Authorizing Provider  bisacodyl (DULCOLAX) 10 MG suppository Place 1 suppository (10 mg total) rectally daily as needed for moderate constipation. 05/31/20  Yes Kc, Dayna Barker, MD  busPIRone (BUSPAR) 5 MG tablet Take 5 mg by mouth daily.   Yes [provider]  Cholecalciferol (VITAMIN D-1000 MAX ST) 25 MCG (1000 UT) tablet Take 1,000 Units by mouth daily.   Yes [provider]  cyclobenzaprine (FLEXERIL) 10 MG tablet Take 1 tablet (10 mg total) by mouth 2 (two) times daily as needed for muscle spasms. 05/07/22  Yes Ernie Avena, MD  diclofenac Sodium (VOLTAREN) 1 % GEL Apply 4 g topically 4 (four) times daily. Patient taking differently: Apply 4 g topically 2 (two) times daily as needed (joint pain). 09/03/19  Yes  Melene Plan, DO  escitalopram (LEXAPRO) 20 MG tablet Take 20 mg by mouth at bedtime.   Yes [provider]  fluticasone (FLONASE) 50 MCG/ACT nasal spray Place 1 spray into both nostrils daily as needed for allergies. 06/13/20  Yes Medina-Vargas, Monina C, NP  furosemide (LASIX) 20 MG tablet Take 1-2 tablets (20-40 mg total) by mouth daily as needed for fluid. Patient taking differently: Take 20 mg by mouth daily as needed for fluid. 06/13/20  Yes Medina-Vargas, Monina C, NP  HYDROcodone-acetaminophen (NORCO/VICODIN) 5-325 MG tablet Take 1-2 tablets by mouth every 4 (four) hours as needed. 05/07/22  Yes Ernie Avena, MD  hydrOXYzine (VISTARIL) 25 MG capsule Take 25 mg by mouth every 8 (eight) hours as needed for anxiety.   Yes [provider]  insulin aspart (NOVOLOG) 100 UNIT/ML FlexPen Inject 5 Units into the skin 3 (three) times daily with meals. 06/13/20  Yes Medina-Vargas, Monina C, NP  isosorbide mononitrate (IMDUR) 30 MG 24 hr tablet Take 30 mg by mouth daily. 05/31/21  Yes [provider]  losartan (COZAAR) 25 MG tablet Take 25 mg by mouth daily.   Yes [provider]  pantoprazole (PROTONIX) 40 MG tablet Take 1 tablet (40 mg total) by mouth every morning. 06/13/20  Yes Medina-Vargas, Monina C, NP  pregabalin (LYRICA) 300 MG capsule Take 300 mg by mouth 2 (two) times daily. 04/26/20  Yes [provider]  rosuvastatin (CRESTOR)  20 MG tablet TAKE 1 TABLET BY MOUTH EVERY DAY 11/02/21  Yes Corky Crafts, MD  TRESIBA FLEXTOUCH 100 UNIT/ML FlexTouch Pen Inject 34 Units into the skin daily. Patient taking differently: Inject 36 Units into the skin daily. 06/13/20  Yes Medina-Vargas, Monina C, NP  XARELTO 20 MG TABS tablet Take 1 tablet (20 mg total) by mouth daily. 06/13/20  Yes Medina-Vargas, Monina C, NP  albuterol (VENTOLIN HFA) 108 (90 Base) MCG/ACT inhaler Inhale 1-2 puffs into the lungs every 6 (six) hours as needed for wheezing or shortness of breath.  09/06/20   Ward, Tylene Fantasia, PA-C  chlorhexidine (PERIDEX) 0.12 % solution Use as directed 5 mLs in the mouth or throat as needed (mouth pain).    [provider]  cholestyramine light (PREVALITE) 4 g packet Take 1 packet by mouth daily as needed for constipation. 05/23/21   [provider]  clobetasol cream (TEMOVATE) 0.05 % Apply 1 Application topically daily as needed for rash. 05/31/21   [provider]  hydrochlorothiazide (MICROZIDE) 12.5 MG capsule Take 1 capsule (12.5 mg total) by mouth daily. 06/13/20   Medina-Vargas, Monina C, NP  meclizine (ANTIVERT) 12.5 MG tablet Take 12.5 mg by mouth 2 (two) times daily as needed for dizziness. 04/25/21   [provider]  modafinil (PROVIGIL) 100 MG tablet TAKE 1 TABLET BY MOUTH EVERY DAY 06/29/21   Dohmeier, Porfirio Mylar, MD  nitroGLYCERIN (NITROSTAT) 0.4 MG SL tablet Place 0.4 mg under the tongue every 5 (five) minutes as needed for chest pain.    [provider]  nystatin (MYCOSTATIN/NYSTOP) powder Apply 1 Application topically as needed (irritation).    [provider]  nystatin cream (MYCOSTATIN) Apply 1 Application topically as needed for rash. 06/15/21   [provider]  ondansetron (ZOFRAN) 4 MG tablet Take 4 mg by mouth every 8 (eight) hours as needed for nausea/vomiting, nausea or vomiting.    [provider]  polyethylene glycol (MIRALAX / GLYCOLAX) 17 g packet Take 17 g by mouth daily. Patient taking differently: Take 17 g by mouth daily as needed for moderate constipation. 05/31/20   Lanae Boast, MD  sitaGLIPtin (JANUVIA) 50 MG tablet Take 1 tablet (50 mg total) by mouth daily. 06/13/20   Medina-Vargas, Monina C, NP  valACYclovir (VALTREX) 500 MG tablet Take 500 mg by mouth daily as needed (cold sores). 01/25/21   [provider]      Allergies    Other, Phenergan [promethazine], Iohexol, Phenergan [promethazine], and Tramadol    Review of Systems   Review of Systems   Musculoskeletal:  Positive for back pain.  All other systems reviewed and are negative.   Physical Exam Updated Vital Signs BP 111/61 (BP Location: Right Arm)   Pulse 65   Temp 98.1 F (36.7 C) (Oral)   Resp 17   Ht 5\' 5"  (1.651 m)   Wt 83.5 kg   SpO2 98%   BMI 30.62 kg/m  Physical Exam Vitals and nursing note reviewed.  Constitutional:      General: He is not in acute distress.    Appearance: He is well-developed.  HENT:     Head: Normocephalic and atraumatic.  Eyes:     Conjunctiva/sclera: Conjunctivae normal.  Cardiovascular:     Rate and Rhythm: Normal rate and regular rhythm.     Heart sounds: No murmur heard. Pulmonary:     Effort: Pulmonary effort is normal. No respiratory distress.     Breath sounds: Normal breath sounds.  Abdominal:  Palpations: Abdomen is soft.     Tenderness: There is no abdominal tenderness.  Musculoskeletal:        General: No swelling.     Cervical back: Neck supple.     Comments: Positive straight leg raise test on the left  Skin:    General: Skin is warm and dry.     Capillary Refill: Capillary refill takes less than 2 seconds.  Neurological:     Mental Status: He is alert.     GCS: GCS eye subscore is 4. GCS verbal subscore is 5. GCS motor subscore is 6.     Cranial Nerves: Cranial nerves 2-12 are intact.     Comments: 5/5 strength in the bilateral upper extremities with intact sensation to light touch, 4-5 strength in the left lower extremity, intact sensation to light touch, 5 out of 5 strength in the right lower extremity, intact sensation to light touch.  Left lower extremity weakness is at baseline.  Psychiatric:        Mood and Affect: Mood normal.     ED Results / Procedures / Treatments   Labs (all labs ordered are listed, but only abnormal results are displayed) Labs Reviewed - No data to display  EKG None  Radiology No results found.  Procedures Procedures    Medications Ordered in ED Medications   ketorolac (TORADOL) injection 30 mg (30 mg Intramuscular Given 05/07/22 1256)    ED Course/ Medical Decision Making/ A&P                             Medical Decision Making Risk Prescription drug management.    82 year old male with medical history significant for DM 2, CAD, recurrent TIAs, left-sided hemiparesis from old stroke, chronic back pain, CKD, presenting to the emergency department with back pain and radiculopathy.  The patient states that he has had chronic back pain which flared up around April 1 and has been present radiating from his left flank down to his left leg.  He has chronic left lower extremity weakness.  He denies any new weakness or numbness.  He denies any saddle anesthesia.  He denies any recent falls or trauma.  He denies any urinary or fecal incontinence, history of IV drug abuse.  He states that he has been managing his back pain with Norco at home but is almost out.  He denies any urinary symptoms.  No fevers or chills.  On arrival, the patient was afebrile, hemodynamically stable.  Physical exam generally unremarkable with the exception of a positive straight leg raise test on the left.  Patient has a baseline neurologic exam with some baseline weakness in the left lower extremity, no new weakness.  The patient is able to ambulate and is hemodynamically stable. There are no red flag symptoms. Specifically, he denies: -Being on an anticoagulant or blood thinner -Using IV drugs -Having a history of AAA -Having saddle anesthesia -Having urinary or fecal incontinence -Having any recent falls or trauma   Differential Diagnoses: I do not think that Omir Cooprider is experiencing cauda equina syndrome, abdominal aortic aneurysm, epidural abscess, aortic dissection, spinal hematoma, nephrolithiasis, spinal metastasis, discitis, or an acute fracture.   While in the ED, I provided the patient with: Medications  ketorolac (TORADOL) injection 30 mg (30 mg  Intramuscular Given 05/07/22 1256)    On reassessment, the patient was able to ambulate and was symptomatically improved. Declines opiate pain medication and flexeril as  he has to drive. This presentation is most consistent with chronic low back pain with left sided radiculopathy, likely a result of degenerative disc disease or spinal stenosis.  Given the patient's reassuring presentation, I believe that he is safe for discharge.  I provided ED return precautions, specifically for the symptoms which are most concerning (e.g., saddle anesthesia, urinary or bowel incontinence or retention, changing or worsening pain), which would necessitate immediate return.  I encouraged the patient to followup with their PCP and [facility].   Final Clinical Impression(s) / ED Diagnoses Final diagnoses:  Chronic bilateral low back pain with left-sided sciatica    Rx / DC Orders ED Discharge Orders          Ordered    HYDROcodone-acetaminophen (NORCO/VICODIN) 5-325 MG tablet  Every 4 hours PRN        05/07/22 1255    cyclobenzaprine (FLEXERIL) 10 MG tablet  2 times daily PRN        05/07/22 1255              Ernie Avena, MD 05/07/22 2029

## 2022-05-07 NOTE — ED Triage Notes (Signed)
Left lower back pain that radiates down left leg x 3 weeks. Denies swelling. Denies injury. CMS intact in left leg. Bending over and applying pressure worsens pain.

## 2022-05-07 NOTE — ED Notes (Signed)
Patient reports int. sharp flank pain, onset 04/10/2022, denies GI/GU complaints.

## 2022-05-08 DIAGNOSIS — Z8546 Personal history of malignant neoplasm of prostate: Secondary | ICD-10-CM | POA: Diagnosis not present

## 2022-05-10 ENCOUNTER — Encounter (INDEPENDENT_AMBULATORY_CARE_PROVIDER_SITE_OTHER): Payer: Medicare Other

## 2022-05-10 ENCOUNTER — Encounter (INDEPENDENT_AMBULATORY_CARE_PROVIDER_SITE_OTHER): Payer: Medicare Other | Admitting: Nurse Practitioner

## 2022-05-10 DIAGNOSIS — R2681 Unsteadiness on feet: Secondary | ICD-10-CM | POA: Diagnosis not present

## 2022-05-10 DIAGNOSIS — R270 Ataxia, unspecified: Secondary | ICD-10-CM | POA: Diagnosis not present

## 2022-05-11 DIAGNOSIS — D649 Anemia, unspecified: Secondary | ICD-10-CM | POA: Diagnosis not present

## 2022-05-11 DIAGNOSIS — I10 Essential (primary) hypertension: Secondary | ICD-10-CM | POA: Diagnosis not present

## 2022-05-11 DIAGNOSIS — E782 Mixed hyperlipidemia: Secondary | ICD-10-CM | POA: Diagnosis not present

## 2022-05-11 DIAGNOSIS — N183 Chronic kidney disease, stage 3 unspecified: Secondary | ICD-10-CM | POA: Diagnosis not present

## 2022-05-11 DIAGNOSIS — I2510 Atherosclerotic heart disease of native coronary artery without angina pectoris: Secondary | ICD-10-CM | POA: Diagnosis not present

## 2022-05-11 DIAGNOSIS — K219 Gastro-esophageal reflux disease without esophagitis: Secondary | ICD-10-CM | POA: Diagnosis not present

## 2022-05-11 DIAGNOSIS — E1140 Type 2 diabetes mellitus with diabetic neuropathy, unspecified: Secondary | ICD-10-CM | POA: Diagnosis not present

## 2022-05-15 DIAGNOSIS — R270 Ataxia, unspecified: Secondary | ICD-10-CM | POA: Diagnosis not present

## 2022-05-15 DIAGNOSIS — R2681 Unsteadiness on feet: Secondary | ICD-10-CM | POA: Diagnosis not present

## 2022-05-17 DIAGNOSIS — R270 Ataxia, unspecified: Secondary | ICD-10-CM | POA: Diagnosis not present

## 2022-05-17 DIAGNOSIS — R2681 Unsteadiness on feet: Secondary | ICD-10-CM | POA: Diagnosis not present

## 2022-05-22 DIAGNOSIS — R2681 Unsteadiness on feet: Secondary | ICD-10-CM | POA: Diagnosis not present

## 2022-05-22 DIAGNOSIS — R270 Ataxia, unspecified: Secondary | ICD-10-CM | POA: Diagnosis not present

## 2022-05-25 DIAGNOSIS — R270 Ataxia, unspecified: Secondary | ICD-10-CM | POA: Diagnosis not present

## 2022-05-25 DIAGNOSIS — R2681 Unsteadiness on feet: Secondary | ICD-10-CM | POA: Diagnosis not present

## 2022-05-29 ENCOUNTER — Emergency Department (HOSPITAL_BASED_OUTPATIENT_CLINIC_OR_DEPARTMENT_OTHER)
Admission: EM | Admit: 2022-05-29 | Discharge: 2022-05-29 | Disposition: A | Discharge status: Home / Self Care | Payer: Medicare Other | Attending: Emergency Medicine | Admitting: Emergency Medicine

## 2022-05-29 ENCOUNTER — Emergency Department (HOSPITAL_BASED_OUTPATIENT_CLINIC_OR_DEPARTMENT_OTHER): Payer: Medicare Other

## 2022-05-29 ENCOUNTER — Other Ambulatory Visit: Payer: Self-pay

## 2022-05-29 ENCOUNTER — Encounter (HOSPITAL_BASED_OUTPATIENT_CLINIC_OR_DEPARTMENT_OTHER): Payer: Self-pay | Admitting: Urology

## 2022-05-29 DIAGNOSIS — I129 Hypertensive chronic kidney disease with stage 1 through stage 4 chronic kidney disease, or unspecified chronic kidney disease: Secondary | ICD-10-CM | POA: Insufficient documentation

## 2022-05-29 DIAGNOSIS — E1122 Type 2 diabetes mellitus with diabetic chronic kidney disease: Secondary | ICD-10-CM | POA: Insufficient documentation

## 2022-05-29 DIAGNOSIS — R519 Headache, unspecified: Secondary | ICD-10-CM | POA: Diagnosis not present

## 2022-05-29 DIAGNOSIS — N189 Chronic kidney disease, unspecified: Secondary | ICD-10-CM | POA: Diagnosis not present

## 2022-05-29 DIAGNOSIS — M25512 Pain in left shoulder: Secondary | ICD-10-CM | POA: Diagnosis not present

## 2022-05-29 DIAGNOSIS — Z794 Long term (current) use of insulin: Secondary | ICD-10-CM | POA: Insufficient documentation

## 2022-05-29 DIAGNOSIS — Z7901 Long term (current) use of anticoagulants: Secondary | ICD-10-CM | POA: Diagnosis not present

## 2022-05-29 DIAGNOSIS — Z79899 Other long term (current) drug therapy: Secondary | ICD-10-CM | POA: Diagnosis not present

## 2022-05-29 DIAGNOSIS — I10 Essential (primary) hypertension: Secondary | ICD-10-CM | POA: Diagnosis not present

## 2022-05-29 NOTE — ED Notes (Signed)
Pt reports left shoulder pain x 4 weeks after doing physical therapy

## 2022-05-29 NOTE — ED Triage Notes (Signed)
Pt states right sided scalp cyst that is is not healing well and has been hurting x 3 weeks, no redness or swelling noted to area he is pointing at, pt also states left shoulder pain radiating down left arm x 4 weeks after going to physical therapy, pain worse with movement and ROM Denies any chest pain  Pt states rubs arm in rubbing alcohol for pain

## 2022-05-29 NOTE — Discharge Instructions (Signed)
You have been seen today for your complaint of left shoulder pain, intermittent headaches. Your imaging was reassuring and showed no acute abnormalities. Your discharge medications include your home medications.  You may also use Voltaren gel and lidocaine patches. Follow up with: Your neurologist as soon as possible regarding your headaches Please seek immediate medical care if you develop any of the following symptoms: You have a new injury and your pain is worse or different. You feel numb or you have tingling in the painful area. At this time there does not appear to be the presence of an emergent medical condition, however there is always the potential for conditions to change. Please read and follow the below instructions.  Do not take your medicine if  develop an itchy rash, swelling in your mouth or lips, or difficulty breathing; call 911 and seek immediate emergency medical attention if this occurs.  You may review your lab tests and imaging results in their entirety on your MyChart account.  Please discuss all results of fully with your primary care provider and other specialist at your follow-up visit.  Note: Portions of this text may have been transcribed using voice recognition software. Every effort was made to ensure accuracy; however, inadvertent computerized transcription errors may still be present.

## 2022-05-29 NOTE — ED Provider Notes (Signed)
[place] [facility] Provider Note   CSN: [id number] Arrival date & time: 05/29/22  1820     History  Chief Complaint  Patient presents with   Shoulder Pain   Scalp cyst    Steve Brown is a 82 y.o. male.  With a significant history including but not limited to hypertension, CKD, arthritis, anxiety, depression, diabetes, strokes on Xarelto who presents to the ED for evaluation of left shoulder pain and right-sided headache.  Right-sided headache is described as intermittent and has been present for the past 4 to 5 months.  Patient is localized over the temporal area where a cyst was excised in the past.  He reports intermittent blurred vision of the eye as well.  No numbness, weakness or tingling globally.  Also complaining of left shoulder pain.  This began 3 weeks ago and has progressively gotten worse.  Describes this as an aching pain states it is secondary to his physical therapy.  He told his physical therapist about this pain and states they backed off on some of his exercises.  He has been going to physical therapy due to his stroke.  The pain starts at the deltoid and radiates down to the fingers.  He has been taking oxycodone for his pain with moderate improvement.   Shoulder Pain      Home Medications Prior to Admission medications   Medication Sig Start Date End Date Taking? Authorizing Provider  albuterol (VENTOLIN HFA) 108 (90 Base) MCG/ACT inhaler Inhale 1-2 puffs into the lungs every 6 (six) hours as needed for wheezing or shortness of breath. 09/06/20   Ward, Tylene Fantasia, PA-C  bisacodyl (DULCOLAX) 10 MG suppository Place 1 suppository (10 mg total) rectally daily as needed for moderate constipation. 05/31/20   Lanae Boast, MD  busPIRone (BUSPAR) 5 MG tablet Take 5 mg by mouth daily.    [provider]  chlorhexidine (PERIDEX) 0.12 % solution Use as directed 5 mLs in the mouth or throat as needed (mouth pain).    [provider]  Cholecalciferol (VITAMIN D-1000 MAX ST) 25 MCG (1000 UT) tablet Take 1,000 Units by mouth daily.    [provider]  cholestyramine light (PREVALITE) 4 g packet Take 1 packet by mouth daily as needed for constipation. 05/23/21   [provider]  clobetasol cream (TEMOVATE) 0.05 % Apply 1 Application topically daily as needed for rash. 05/31/21   [provider]  cyclobenzaprine (FLEXERIL) 10 MG tablet Take 1 tablet (10 mg total) by mouth 2 (two) times daily as needed for muscle spasms. 05/07/22   Ernie Avena, MD  diclofenac Sodium (VOLTAREN) 1 % GEL Apply 4 g topically 4 (four) times daily. Patient taking differently: Apply 4 g topically 2 (two) times daily as needed (joint pain). 09/03/19   Melene Plan, DO  escitalopram (LEXAPRO) 20 MG tablet Take 20 mg by mouth at bedtime.    [provider]  fluticasone (FLONASE) 50 MCG/ACT nasal spray Place 1 spray into both nostrils daily as needed for allergies. 06/13/20   Medina-Vargas, Monina C, NP  furosemide (LASIX) 20 MG tablet Take 1-2 tablets (20-40 mg total) by mouth daily as needed for fluid. Patient taking differently: Take 20 mg by mouth daily as needed for fluid. 06/13/20   Medina-Vargas, Monina C, NP  hydrochlorothiazide (MICROZIDE) 12.5 MG capsule Take 1 capsule (12.5 mg total) by mouth daily. 06/13/20   Medina-Vargas, Monina C, NP  HYDROcodone-acetaminophen (NORCO/VICODIN) 5-325 MG tablet Take 1-2  tablets by mouth every 4 (four) hours as needed. 05/07/22   Ernie Avena, MD  hydrOXYzine (VISTARIL) 25 MG capsule Take 25 mg by mouth every 8 (eight) hours as needed for anxiety.    [provider]  insulin aspart (NOVOLOG) 100 UNIT/ML FlexPen Inject 5 Units into the skin 3 (three) times daily with meals. 06/13/20   Medina-Vargas, Monina C, NP  isosorbide mononitrate (IMDUR) 30 MG 24 hr tablet Take 30 mg by mouth daily. 05/31/21   [provider]  losartan (COZAAR) 25 MG tablet Take 25 mg by  mouth daily.    [provider]  meclizine (ANTIVERT) 12.5 MG tablet Take 12.5 mg by mouth 2 (two) times daily as needed for dizziness. 04/25/21   [provider]  modafinil (PROVIGIL) 100 MG tablet TAKE 1 TABLET BY MOUTH EVERY DAY 06/29/21   Dohmeier, Porfirio Mylar, MD  nitroGLYCERIN (NITROSTAT) 0.4 MG SL tablet Place 0.4 mg under the tongue every 5 (five) minutes as needed for chest pain.    [provider]  nystatin (MYCOSTATIN/NYSTOP) powder Apply 1 Application topically as needed (irritation).    [provider]  nystatin cream (MYCOSTATIN) Apply 1 Application topically as needed for rash. 06/15/21   [provider]  ondansetron (ZOFRAN) 4 MG tablet Take 4 mg by mouth every 8 (eight) hours as needed for nausea/vomiting, nausea or vomiting.    [provider]  pantoprazole (PROTONIX) 40 MG tablet Take 1 tablet (40 mg total) by mouth every morning. 06/13/20   Medina-Vargas, Monina C, NP  polyethylene glycol (MIRALAX / GLYCOLAX) 17 g packet Take 17 g by mouth daily. Patient taking differently: Take 17 g by mouth daily as needed for moderate constipation. 05/31/20   Lanae Boast, MD  pregabalin (LYRICA) 300 MG capsule Take 300 mg by mouth 2 (two) times daily. 04/26/20   [provider]  rosuvastatin (CRESTOR) 20 MG tablet TAKE 1 TABLET BY MOUTH EVERY DAY 11/02/21   Corky Crafts, MD  sitaGLIPtin (JANUVIA) 50 MG tablet Take 1 tablet (50 mg total) by mouth daily. 06/13/20   Medina-Vargas, Monina C, NP  TRESIBA FLEXTOUCH 100 UNIT/ML FlexTouch Pen Inject 34 Units into the skin daily. Patient taking differently: Inject 36 Units into the skin daily. 06/13/20   Medina-Vargas, Monina C, NP  valACYclovir (VALTREX) 500 MG tablet Take 500 mg by mouth daily as needed (cold sores). 01/25/21   [provider]  XARELTO 20 MG TABS tablet Take 1 tablet (20 mg total) by mouth daily. 06/13/20   Medina-Vargas, Monina C, NP      Allergies    Other, Phenergan  [promethazine], Iohexol, Phenergan [promethazine], and Tramadol    Review of Systems   Review of Systems  Musculoskeletal:  Positive for arthralgias and myalgias.  Neurological:  Positive for light-headedness.  All other systems reviewed and are negative.   Physical Exam Updated Vital Signs BP (!) 156/95 (BP Location: Right Arm)   Pulse 68   Temp 97.8 F (36.6 C)   Resp 18   Ht 5\' 5"  (1.651 m)   Wt 83.4 kg   SpO2 100%   BMI 30.60 kg/m  Physical Exam Vitals and nursing note reviewed.  Constitutional:      General: He is not in acute distress.    Appearance: He is well-developed. He is not ill-appearing, toxic-appearing or diaphoretic.     Comments: Resting comfortably in bed  HENT:     Head: Normocephalic and atraumatic.  Eyes:     Extraocular  Movements: Extraocular movements intact.     Conjunctiva/sclera: Conjunctivae normal.     Pupils: Pupils are equal, round, and reactive to light.  Cardiovascular:     Rate and Rhythm: Normal rate and regular rhythm.     Heart sounds: No murmur heard. Pulmonary:     Effort: Pulmonary effort is normal. No respiratory distress.     Breath sounds: Normal breath sounds.  Abdominal:     Palpations: Abdomen is soft.     Tenderness: There is no abdominal tenderness.  Musculoskeletal:        General: No swelling.     Cervical back: Neck supple.     Comments: TTP to the left deltoid.  Full active ROM of bilateral upper extremities.  Grip strength out of 5 bilaterally.  Sensation intact in all digits.  Capillary refill normal.  Radial pulses 2+ bilaterally.  Compartments soft bilaterally.  Skin:    General: Skin is warm and dry.     Capillary Refill: Capillary refill takes less than 2 seconds.  Neurological:     General: No focal deficit present.     Mental Status: He is alert and oriented to person, place, and time.  Psychiatric:        Mood and Affect: Mood normal.        Behavior: Behavior normal.     ED Results / Procedures /  Treatments   Labs (all labs ordered are listed, but only abnormal results are displayed) Labs Reviewed - No data to display  EKG EKG Interpretation  Date/Time:  Monday May 29 2022 18:33:10 EDT Ventricular Rate:  64 PR Interval:  154 QRS Duration: 92 QT Interval:  402 QTC Calculation: 415 R Axis:   16 Text Interpretation: Sinus rhythm Abnormal R-wave progression, early transition Confirmed by Virgina Norfolk ([id number]) on 05/29/2022 6:42:03 PM  Radiology CT Head Wo Contrast  Result Date: 05/29/2022 CLINICAL DATA:  Headache, increasing frequency or severity. EXAM: CT HEAD WITHOUT CONTRAST TECHNIQUE: Contiguous axial images were obtained from the base of the skull through the vertex without intravenous contrast. RADIATION DOSE REDUCTION: This exam was performed according to the departmental dose-optimization program which includes automated exposure control, adjustment of the mA and/or kV according to patient size and/or use of iterative reconstruction technique. COMPARISON:  Head CT 05/02/2022. FINDINGS: Brain: No acute intracranial hemorrhage. Unchanged old infarct in the left cerebellar hemisphere. Gray-white differentiation is otherwise preserved. No hydrocephalus or extra-axial collection. No mass effect or midline shift. Vascular: No hyperdense vessel or unexpected calcification. Skull: No calvarial fracture or suspicious bone lesion. Skull base is unremarkable. Sinuses/Orbits: Chronic right sphenoid sinusitis. Partial opacification of the ethmoid air cells. Orbits are unremarkable. Other: None. IMPRESSION: 1. No acute intracranial abnormality. 2. Unchanged old left cerebellar infarct. 3. Chronic right sphenoid sinusitis. Electronically Signed   By: Orvan Falconer M.D.   On: 05/29/2022 20:30   DG Shoulder Left  Result Date: 05/29/2022 CLINICAL DATA:  Left shoulder pain after physical therapy. EXAM: LEFT SHOULDER - 2+ VIEW COMPARISON:  None Available. FINDINGS: There is no evidence of fracture or  dislocation. Glenohumeral joint space narrowing with marginal osteophytes. There is also acromioclavicular joint space narrowing. Soft tissues are unremarkable. IMPRESSION: 1. No acute fracture or dislocation. 2. Moderate glenohumeral and mild acromioclavicular osteoarthritis. Electronically Signed   By: Larose Hires D.O.   On: 05/29/2022 18:51    Procedures Procedures    Medications Ordered in ED Medications - No data to display  ED Course/ Medical Decision Making/ A&P  Medical Decision Making Amount and/or Complexity of Data Reviewed Radiology: ordered.  This patient presents to the ED for concern of left shoulder pain, intermittent headaches, this involves an extensive number of treatment options, and is a complaint that carries with it a high risk of complications and morbidity.  The differential diagnosis includes fracture, strain, sprain, contusion, overuse injury  Emergent considerations for headache include subarachnoid hemorrhage, meningitis, temporal arteritis, glaucoma, cerebral ischemia, carotid/vertebral dissection, intracranial tumor, Venous sinus thrombosis, carbon monoxide poisoning, acute or chronic subdural hemorrhage.  Other considerations include: Migraine, Cluster headache, Hypertension, Caffeine, alcohol, or drug withdrawal, Pseudotumor cerebri, Arteriovenous malformation, Head injury, Neurocysticercosis, Post-lumbar puncture, Preeclampsia, Tension headache, Sinusitis, Cervical arthritis, Refractive error causing strain, Dental abscess, Otitis media, Temporomandibular joint syndrome, Depression, Somatoform disorder (eg, somatization) Trigeminal neuralgia, Glossopharyngeal neuralgia.    Co morbidities that complicate the patient evaluation  significant history including but not limited to hypertension, CKD, arthritis, anxiety, depression, diabetes, strokes on Xarelto  My initial workup includes imaging  Additional history obtained  from: Nursing notes from this visit.  I ordered imaging studies including x-ray left shoulder, CT head I independently visualized and interpreted imaging which showed glenohumeral and AC osteoarthritis of the left shoulder.  No acute osseous abnormalities.  No acute intracranial abnormalities on CT head. I agree with the radiologist interpretation  Afebrile, hemodynamically stable.  82 year old male presenting to the ED for evaluation of left shoulder pain.  This is been present for 3 weeks and has progressively gotten worse.  He goes to physical therapy to rehab from his previous strokes.  His neurovascular status is intact.  He is not complaining of any radicular symptoms.  Imaging overall reassuring.  Likely overuse injury.  Also complaining of right-sided headache.  This has been intermittent for the past 4 to 5 months.  Pain is localized to where a cyst was excised in the right temporal area.  There is a surgical scar present which appears to be well-healing.  No masses or erythema appreciated in this area.  CT head was reassuring as well.  Unclear etiology of his headaches, however he has an appointment with his neurology provider coming up.  He was strongly encouraged to bring this up to his neurologist.  I have low suspicion for acute intracranial abnormalities given the chronicity of his symptoms.  He was encouraged to keep using his pain medications as prescribed and to add Voltaren gel and lidocaine patches as needed.  He was given strict return precautions.  Stable at discharge.  At this time there does not appear to be any evidence of an acute emergency medical condition and the patient appears stable for discharge with appropriate outpatient follow up. Diagnosis was discussed with patient who verbalizes understanding of care plan and is agreeable to discharge. I have discussed return precautions with patient who verbalizes understanding. Patient encouraged to follow-up with their PCP within  1 week. All questions answered.  Patient's case discussed with Dr. Lockie Mola who agrees with plan to discharge with follow-up.   Note: Portions of this report may have been transcribed using voice recognition software. Every effort was made to ensure accuracy; however, inadvertent computerized transcription errors may still be present.        Final Clinical Impression(s) / ED Diagnoses Final diagnoses:  Acute pain of left shoulder  Acute nonintractable headache, unspecified headache type    Rx / DC Orders ED Discharge Orders     None         Jaliya Siegmann, Edsel Petrin,  PA-C 05/29/22 2113    Virgina Norfolk, DO 05/29/22 2247

## 2022-06-01 DIAGNOSIS — M79602 Pain in left arm: Secondary | ICD-10-CM | POA: Diagnosis not present

## 2022-06-01 DIAGNOSIS — Z09 Encounter for follow-up examination after completed treatment for conditions other than malignant neoplasm: Secondary | ICD-10-CM | POA: Diagnosis not present

## 2022-06-01 DIAGNOSIS — I959 Hypotension, unspecified: Secondary | ICD-10-CM | POA: Diagnosis not present

## 2022-06-01 DIAGNOSIS — Z6830 Body mass index (BMI) 30.0-30.9, adult: Secondary | ICD-10-CM | POA: Diagnosis not present

## 2022-06-01 DIAGNOSIS — R519 Headache, unspecified: Secondary | ICD-10-CM | POA: Diagnosis not present

## 2022-06-12 DIAGNOSIS — R270 Ataxia, unspecified: Secondary | ICD-10-CM | POA: Diagnosis not present

## 2022-06-12 DIAGNOSIS — R2681 Unsteadiness on feet: Secondary | ICD-10-CM | POA: Diagnosis not present

## 2022-06-13 DIAGNOSIS — Z6831 Body mass index (BMI) 31.0-31.9, adult: Secondary | ICD-10-CM | POA: Diagnosis not present

## 2022-06-13 DIAGNOSIS — R413 Other amnesia: Secondary | ICD-10-CM | POA: Diagnosis not present

## 2022-06-16 DIAGNOSIS — R270 Ataxia, unspecified: Secondary | ICD-10-CM | POA: Diagnosis not present

## 2022-06-16 DIAGNOSIS — R2681 Unsteadiness on feet: Secondary | ICD-10-CM | POA: Diagnosis not present

## 2022-06-20 DIAGNOSIS — I10 Essential (primary) hypertension: Secondary | ICD-10-CM | POA: Diagnosis not present

## 2022-06-20 DIAGNOSIS — R42 Dizziness and giddiness: Secondary | ICD-10-CM | POA: Diagnosis not present

## 2022-06-20 DIAGNOSIS — Z6831 Body mass index (BMI) 31.0-31.9, adult: Secondary | ICD-10-CM | POA: Diagnosis not present

## 2022-06-21 DIAGNOSIS — M25812 Other specified joint disorders, left shoulder: Secondary | ICD-10-CM | POA: Diagnosis not present

## 2022-06-21 DIAGNOSIS — R2681 Unsteadiness on feet: Secondary | ICD-10-CM | POA: Diagnosis not present

## 2022-06-21 DIAGNOSIS — R270 Ataxia, unspecified: Secondary | ICD-10-CM | POA: Diagnosis not present

## 2022-06-23 DIAGNOSIS — R2681 Unsteadiness on feet: Secondary | ICD-10-CM | POA: Diagnosis not present

## 2022-06-23 DIAGNOSIS — R270 Ataxia, unspecified: Secondary | ICD-10-CM | POA: Diagnosis not present

## 2022-06-26 DIAGNOSIS — R270 Ataxia, unspecified: Secondary | ICD-10-CM | POA: Diagnosis not present

## 2022-06-26 DIAGNOSIS — R2681 Unsteadiness on feet: Secondary | ICD-10-CM | POA: Diagnosis not present

## 2022-06-28 ENCOUNTER — Encounter: Payer: Self-pay | Admitting: Neurology

## 2022-06-28 ENCOUNTER — Ambulatory Visit (INDEPENDENT_AMBULATORY_CARE_PROVIDER_SITE_OTHER): Payer: Medicare Other | Admitting: Neurology

## 2022-06-28 VITALS — BP 116/61 | HR 57 | Ht 65.0 in | Wt 184.4 lb

## 2022-06-28 DIAGNOSIS — F0153 Vascular dementia, unspecified severity, with mood disturbance: Secondary | ICD-10-CM

## 2022-06-28 DIAGNOSIS — R519 Headache, unspecified: Secondary | ICD-10-CM

## 2022-06-28 DIAGNOSIS — M5116 Intervertebral disc disorders with radiculopathy, lumbar region: Secondary | ICD-10-CM | POA: Diagnosis not present

## 2022-06-28 DIAGNOSIS — G453 Amaurosis fugax: Secondary | ICD-10-CM

## 2022-06-28 DIAGNOSIS — I679 Cerebrovascular disease, unspecified: Secondary | ICD-10-CM

## 2022-06-28 DIAGNOSIS — Z8673 Personal history of transient ischemic attack (TIA), and cerebral infarction without residual deficits: Secondary | ICD-10-CM | POA: Diagnosis not present

## 2022-06-28 DIAGNOSIS — R42 Dizziness and giddiness: Secondary | ICD-10-CM | POA: Diagnosis not present

## 2022-06-28 DIAGNOSIS — M545 Low back pain, unspecified: Secondary | ICD-10-CM | POA: Diagnosis not present

## 2022-06-28 NOTE — Progress Notes (Signed)
Provider:  Melvyn Novas, MD  Primary Care Physician:  Daisy Floro, MD [address] [place] [address]     Referring Provider: Daisy Floro, Md [address] [place],  [place] [address]          Chief Complaint according to patient   Patient presents with:     New Referral            HISTORY OF PRESENT ILLNESS:  Steve Brown is a 82 y.o. male patient who is here for memory loss, has been seen for CVA, OSA, nocturnal hypoxia, Dizziness, ataxia, temporal Headaches, DM and neuropathy,  and diplopia in the past.  This visit  date is  06/28/2022 . He had undergone a temporal artery biopsy. No evidence of arteritis was seen. He reports ongoing pain in the temple  , right more than left .  He reports trouble with CPAP compliance and waking up panicked.   Steve Brown is a 82 y.o. male patient who is here for  memory troubles, has still HA and dizziness. Has lost his way when driving. He also was seen for left rotator cuff injury by orthopedic, and ED visit in pain on  05-29-2022.  The patient underwent an MRI of the brain on 02-07-2022 at [facility] long for a syncope-presyncope suspected dizziness by cerebrovascular disease and transient left leg weakness.  There was no acute intracranial abnormality found he has multiple old small vessel infarcts in the cerebellum which is a posterior perfusion area chronic occlusion of the left vertebral artery was known before.  Multifocal small vessel disease.  Interestingly normal parenchymal the radiologist did not state that there was any atrophy.  This study had been ordered by primary care physician Dr. Tenny Craw.  It followed a CT of the head at [facility] long which was compared to a study from 05-30-2021.  That also was negative for acute infection, infarct, hemorrhage or hydrocephalus.  The chronic left cerebellar infarct has always affected the patient's coordination.  The CT made mention of complete opacification of  the right sphenoid and left frontal sinus. Seen by Dr Dionne Ano, MD in ENT.   Todays Memory testing for suspected vascular dementia : Dr Tenny Craw recommended Prevagen. He scored 27/ 30 points on MMSE. 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 Physically active while working.            03-20-2022: Chief concern according to patient :  Diplopia ,  facial and scalp dyseasthesias, pressure sensation, and pain from the temple to the jaw joint on the right side. He reports throbbing and no electric shock sensation , neither spasm.  Jaw feels as if it lock.   Vision is more blurry on the right, but may be in both eyes. He is no longer driving in the dark, not on highways.  He was seen at the [facility] on 03-03-2022 where he had a urine test and CMET , GFR is reduced  : creatinine is 1.3 GFR 54. Has Proteinuria. HbA1c was 8.8 (!) Normal lipid profile.  Normal liver function, sciatica treated with oxycodone. glucose. CBC : anemia  Hgb 13/ Hct 38.    I am worried about his history of strokes and cerebrovascular disease, but also about giant cell arteritis.  His right temporal artery was palpable and he localized the tenderness to that spot. ED visit doesn't mention any dizziness, vision changes only back pain-  Referral for ongoing dizziness for the last 3 months. Low blood pressure . Pt reports dizziness while sitting, while driving, especially when overtaken by  larger vehicles or driving over a bridge. . At the time also notices blurred vision.  These are further described as wavy lines, and may be what causes the dizziness sensation. He has an appointment  with ENT / Audiology. Ophthalmologist did reportedly state- eye health all OK. Dr Tenny Craw reduced the dose of losartan. BP here today 107/ 61 mmHg. Regular heart rate.       Interval history : 01-18-2021:  transient Bilateral blindness, transient followed by horizontal diplopia, 11/ 2022.  Here for follow up after  normal angio CT in ED. ED visit recently-  turns out ED visit was in November 2022- Followed by CT head and CT angiogram.    18 January 2021:  Mr Treygan View was originally seen here as a sleep medicine patient, and he is using his CPAP compliantly. See attached data sheet to today's visit.  The patient endorsed today the FSS fatigue severity score at 38/ 63 points, epworth 4-5 points only. Compliance by days 86% average user time average user time 5 hours and 37 minutes on days used 75% also days he used his machine over 4 hours.  Set pressure at 13 cmH2O with 1 cm EPR, residual AHI is 3.2.  High air leak.  Possibly some central apneas present as well.    This is his last sleep study; IMPRESSION:  1. Complex, mostly Obstructive Sleep Apnea (OSA) a6t AHI of 48.1/  h - high severity.  2. Primary Snoring  3. Some Central and Mixed Sleep Apnea was present.    4. Prolonged Hypoxemia, most severe desaturation during REM sleep  ( see screen shots)             Review of Systems: Out of a complete 14 system review, the patient complains of only the following symptoms, and all other reviewed systems are negative.:   Social History   Socioeconomic History   Marital status: Single    Spouse name: Not on file   Number of children: 4   Years of education: 12   Highest education level: 12th grade  Occupational History    Employer: RETIRED    Comment: retired  Tobacco Use   Smoking status: Never    Passive exposure: Never   Smokeless tobacco: Never  Vaping Use   Vaping Use: Never used  Substance and Sexual Activity   Alcohol use: Not Currently   Drug use: Never   Sexual activity: Not Currently  Other Topics Concern   Not on file  Social History Narrative   Patient lives at home alone and he is single.     Patient is retired.    Caffeine - one cup daily.   Right handed.   Social Determinants of Health   Financial Resource Strain: Low Risk  (02/10/2019)   Overall Financial  Resource Strain (CARDIA)    Difficulty of Paying Living Expenses: Not hard at all  Food Insecurity: No Food Insecurity (09/09/2019)   Hunger Vital Sign    Worried About Running Out of Food in the Last Year: Never true    Ran Out of Food in the Last Year: Never true  Transportation Needs: No Transportation Needs (09/09/2019)   PRAPARE - Administrator, Civil Service (Medical): No    Lack of Transportation (Non-Medical): No  Physical Activity: Inactive (09/09/2019)   Exercise Vital Sign  Days of Exercise per Week: 0 days    Minutes of Exercise per Session: 0 min  Stress: Not on file  Social Connections: Not on file    Family History  Problem Relation Age of Onset   Aneurysm Mother    Cancer Father        unknown either pancreatic or prostate   Dementia Sister    Stroke Brother    Heart attack Neg Hx    Sleep apnea Neg Hx     Past Medical History:  Diagnosis Date   Abnormal prostate biopsy    Anticoagulant long-term use    currently xarelto   Anxiety    BPH with elevated PSA    CKD (chronic kidney disease), stage II    Complication of anesthesia    limted neck rom limited use of left arm due to cva   Coronary artery disease    CARDIOLOGIST-  DR Eldridge Dace--  2010-- PCI w/ stenting midLAD   DDD (degenerative disc disease), lumbar    Degeneration of cervical intervertebral disc    Depression    Diabetes mellitus without complication (HCC)    Type 2   Dyspnea on exertion    GERD (gastroesophageal reflux disease)    Hemiparesis due to cerebral infarction    History of cerebrovascular accident (CVA) with residual deficit 2002 and 2003--  hemiparisis both sides   per MRI  anterior left frontal lobe, left para midline pons, and inferior cerebullam bilaterally infarcts   History of pulmonary embolus (PE)    06-30-2012  extensive bilaterally   History of recurrent TIAs    History of syncope    hx multiple pre-syncope and syncopal episodes due to vasovagal,  orthostatic hypotension, dehydration   History of TIAs    several since 2002   Hyperlipidemia    Hypertension    Mild atherosclerosis of carotid artery, bilateral    per last duplex 11-04-2014  bilateral ICA 1--39%   Neuropathy    fingers   OSA on CPAP    uses most nights; followed by dr Theresa Dohrman--  sev. osa w/ AHI 65.9   Pneumonia    x 1   Prostate cancer (HCC) dx 2018   Renal insufficiency    S/P coronary artery stent placement 2010   stenting to mid LAD   Sensorineural hearing loss, bilateral 04/01/2021   no hearing aids   Simple renal cyst    bilaterally   Stroke [facility])    Trigger finger of both hands 11/17/2013   Type 2 diabetes mellitus (HCC) dx 1986   last one A1c 9.2 on 04-26-2016   Unsteady gait    uses straight cane and occasional walker. Hx prior CVA/TIAs;   Vertebral artery occlusion, left    chronic    Past Surgical History:  Procedure Laterality Date   ANTERIOR CERVICAL DECOMP/DISCECTOMY FUSION  2004   C3 -- C6 limited rom   ARTERY BIOPSY Right 04/21/2022   Procedure: BIOPSY TEMPORAL ARTERY;  Surgeon: Cephus Shelling, MD;  Location: [facility] OR;  Service: Vascular;  Laterality: Right;   CARDIAC CATHETERIZATION  06-10-2010   dr Eldridge Dace   wide patent LAD stent, mid lesion at the origin of the septal prior to the previous stent 40-50%/  normal LVF, ef 55%   CARDIOVASCULAR STRESS TEST  10-23-2012  dr Eldridge Dace   normal nuclear perfusion study w/ no ischemia/  normal LV function and wall motion , ef 65%   CARPAL TUNNEL RELEASE Bilateral    CATARACT  EXTRACTION W/ INTRAOCULAR LENS  IMPLANT, BILATERAL     CHOLECYSTECTOMY N/A 11/02/2015   Procedure: LAPAROSCOPIC CHOLECYSTECTOMY WITH INTRAOPERATIVE CHOLANGIOGRAM;  Surgeon: Manus Rudd, MD;  Location: [facility] OR;  Service: General;  Laterality: N/A;   COLONOSCOPY     CORONARY ANGIOPLASTY WITH STENT PLACEMENT  02/2008   stenting to mid LAD   GOLD SEED IMPLANT N/A 11/15/2016   Procedure: GOLD SEED IMPLANT TIMES THREE;   Surgeon: Crist Fat, MD;  Location: [facility];  Service: Urology;  Laterality: N/A;   IR ANGIO INTRA EXTRACRAN SEL COM CAROTID INNOMINATE BILAT MOD SED  06/13/2018   IR ANGIO VERTEBRAL SEL VERTEBRAL UNI R MOD SED  06/13/2018   IR US GUIDE VASC ACCESS RIGHT  06/13/2018   LEFT HEART CATH AND CORONARY ANGIOGRAPHY N/A 05/25/2017   Procedure: LEFT HEART CATH AND CORONARY ANGIOGRAPHY;  Surgeon: Corky Crafts, MD;  Location: [facility] INVASIVE CV LAB;  Service: Cardiovascular;  Laterality: N/A;   LEFT HEART CATHETERIZATION WITH CORONARY ANGIOGRAM N/A 04/03/2013   Procedure: LEFT HEART CATHETERIZATION WITH CORONARY ANGIOGRAM;  Surgeon: Corky Crafts, MD;  Location: [facility] CATH LAB;  Service: Cardiovascular;  Laterality: N/A;  patent mLAD stent  w/ mild disease in remainder LAD and its branches;  mod. focal lesion midLCFx- FFR of lesion was negative for ischemia/  normal LVSF, ef 50%   lungs  2005   "fluid pumped off lungs"   NEUROPLASTY / TRANSPOSITION ULNAR NERVE AT ELBOW Right 2004   PROSTATE BIOPSY N/A 08/31/2016   Procedure: PROSTATE  BIOPSY TRANSRECTAL ULTRASONIC PROSTATE (TUBP);  Surgeon: Crist Fat, MD;  Location: [facility];  Service: Urology;  Laterality: N/A;   SPACE OAR INSTILLATION N/A 11/15/2016   Procedure: SPACE OAR INSTILLATION;  Surgeon: Crist Fat, MD;  Location: [facility];  Service: Urology;  Laterality: N/A;   TRANSTHORACIC ECHOCARDIOGRAM  04/27/2016   severe focal basal LVH, ef 60-65%,  grade 2 diastoilc dysfunction/  mild AR, MR, and TR/  atrial septum lipomatous hypertrophy/  PASP   UMBILICAL HERNIA REPAIR       Current Outpatient Medications on File Prior to Visit  Medication Sig Dispense Refill   albuterol (VENTOLIN HFA) 108 (90 Base) MCG/ACT inhaler Inhale 1-2 puffs into the lungs every 6 (six) hours as needed for wheezing or shortness of breath. 1 each 0   bisacodyl (DULCOLAX) 10 MG suppository Place  1 suppository (10 mg total) rectally daily as needed for moderate constipation. 12 suppository 0   busPIRone (BUSPAR) 5 MG tablet Take 5 mg by mouth daily.     chlorhexidine (PERIDEX) 0.12 % solution Use as directed 5 mLs in the mouth or throat as needed (mouth pain).     Cholecalciferol (VITAMIN D-1000 MAX ST) 25 MCG (1000 UT) tablet Take 1,000 Units by mouth daily.     cholestyramine light (PREVALITE) 4 g packet Take 1 packet by mouth daily as needed for constipation.     clobetasol cream (TEMOVATE) 0.05 % Apply 1 Application topically daily as needed for rash.     cyclobenzaprine (FLEXERIL) 10 MG tablet Take 1 tablet (10 mg total) by mouth 2 (two) times daily as needed for muscle spasms. 20 tablet 0   diclofenac Sodium (VOLTAREN) 1 % GEL Apply 4 g topically 4 (four) times daily. (Patient taking differently: Apply 4 g topically 2 (two) times daily as needed (joint pain).) 100 g 0   escitalopram (LEXAPRO) 20 MG tablet Take 20 mg by  mouth at bedtime.     fluticasone (FLONASE) 50 MCG/ACT nasal spray Place 1 spray into both nostrils daily as needed for allergies. 16 g 0   furosemide (LASIX) 20 MG tablet Take 1-2 tablets (20-40 mg total) by mouth daily as needed for fluid. (Patient taking differently: Take 20 mg by mouth daily as needed for fluid.) 30 tablet 0   hydrochlorothiazide (MICROZIDE) 12.5 MG capsule Take 1 capsule (12.5 mg total) by mouth daily. 30 capsule 0   HYDROcodone-acetaminophen (NORCO/VICODIN) 5-325 MG tablet Take 1-2 tablets by mouth every 4 (four) hours as needed. 10 tablet 0   hydrOXYzine (VISTARIL) 25 MG capsule Take 25 mg by mouth every 8 (eight) hours as needed for anxiety.     insulin aspart (NOVOLOG) 100 UNIT/ML FlexPen Inject 5 Units into the skin 3 (three) times daily with meals. 15 mL 0   isosorbide mononitrate (IMDUR) 30 MG 24 hr tablet Take 30 mg by mouth daily.     losartan (COZAAR) 25 MG tablet Take 25 mg by mouth daily.     meclizine (ANTIVERT) 12.5 MG tablet Take 12.5  mg by mouth 2 (two) times daily as needed for dizziness.     modafinil (PROVIGIL) 100 MG tablet TAKE 1 TABLET BY MOUTH EVERY DAY 30 tablet 5   nitroGLYCERIN (NITROSTAT) 0.4 MG SL tablet Place 0.4 mg under the tongue every 5 (five) minutes as needed for chest pain.     nystatin (MYCOSTATIN/NYSTOP) powder Apply 1 Application topically as needed (irritation).     nystatin cream (MYCOSTATIN) Apply 1 Application topically as needed for rash.     ondansetron (ZOFRAN) 4 MG tablet Take 4 mg by mouth every 8 (eight) hours as needed for nausea/vomiting, nausea or vomiting.     pantoprazole (PROTONIX) 40 MG tablet Take 1 tablet (40 mg total) by mouth every morning. 30 tablet 0   polyethylene glycol (MIRALAX / GLYCOLAX) 17 g packet Take 17 g by mouth daily. (Patient taking differently: Take 17 g by mouth daily as needed for moderate constipation.) 14 each 0   pregabalin (LYRICA) 300 MG capsule Take 300 mg by mouth 2 (two) times daily.     rosuvastatin (CRESTOR) 20 MG tablet TAKE 1 TABLET BY MOUTH EVERY DAY 90 tablet 3   sitaGLIPtin (JANUVIA) 50 MG tablet Take 1 tablet (50 mg total) by mouth daily. 30 tablet 0   TRESIBA FLEXTOUCH 100 UNIT/ML FlexTouch Pen Inject 34 Units into the skin daily. (Patient taking differently: Inject 36 Units into the skin daily.) 3 mL 0   valACYclovir (VALTREX) 500 MG tablet Take 500 mg by mouth daily as needed (cold sores).     XARELTO 20 MG TABS tablet Take 1 tablet (20 mg total) by mouth daily. 30 tablet 0   No current facility-administered medications on file prior to visit.    Allergies  Allergen Reactions   Other Other (See Comments)    Per patient- cardiac cath dye-  "woke up during procedure hysterical."   Phenergan [Promethazine] Other (See Comments)    Mood changes    Iohexol Other (See Comments)    Patient refuses contrast after having a hysterical event in hospital //r ls spoke with patient    Phenergan [Promethazine]     Other reaction(s): syncope   Tramadol      Increases Blood Sugar      DIAGNOSTIC DATA (LABS, IMAGING, TESTING) - I reviewed patient records, labs, notes, testing and imaging myself where available.  Lab Results  Component Value  Date   WBC 3.8 (L) 05/02/2022   HGB 11.9 (L) 05/02/2022   HCT 35.6 (L) 05/02/2022   MCV 93.7 05/02/2022   PLT 144 (L) 05/02/2022      Component Value Date/Time   NA 139 05/02/2022 1234   NA 138 06/07/2020 0000   NA 140 08/14/2012 1513   K 3.8 05/02/2022 1234   K 4.3 08/14/2012 1513   CL 102 05/02/2022 1234   CO2 30 05/02/2022 1234   CO2 27 08/14/2012 1513   GLUCOSE 123 (H) 05/02/2022 1234   GLUCOSE 262 (H) 08/14/2012 1513   BUN 21 05/02/2022 1234   BUN 22 (A) 06/07/2020 0000   BUN 19.0 08/14/2012 1513   CREATININE 1.41 (H) 05/02/2022 1234   CREATININE 1.29 03/19/2014 1130   CREATININE 1.3 08/14/2012 1513   CALCIUM 8.4 (L) 05/02/2022 1234   CALCIUM 9.3 08/14/2012 1513   PROT 7.0 12/05/2021 1519   PROT 6.3 11/24/2019 0944   PROT 6.9 08/14/2012 1513   ALBUMIN 3.7 12/05/2021 1519   ALBUMIN 4.0 11/24/2019 0944   ALBUMIN 3.4 (L) 08/14/2012 1513   AST 34 12/05/2021 1519   AST 15 08/14/2012 1513   ALT 52 (H) 12/05/2021 1519   ALT 14 08/14/2012 1513   ALKPHOS 74 12/05/2021 1519   ALKPHOS 92 08/14/2012 1513   BILITOT 0.5 12/05/2021 1519   BILITOT 0.6 11/24/2019 0944   BILITOT 0.39 08/14/2012 1513   GFRNONAA 50 (L) 05/02/2022 1234   GFRNONAA 67 05/19/2013 1108   GFRAA 57.29 06/07/2020 0000   GFRAA 78 05/19/2013 1108   Lab Results  Component Value Date   CHOL 139 11/24/2019   HDL 53 11/24/2019   LDLCALC 72 11/24/2019   TRIG 70 11/24/2019   CHOLHDL 2.6 11/24/2019   Lab Results  Component Value Date   HGBA1C 7.5 (H) 05/26/2020   Lab Results  Component Value Date   VITAMINB12 480 05/26/2020   Lab Results  Component Value Date   TSH 1.731 05/26/2020    PHYSICAL EXAM:  Today's Vitals   06/28/22 0929  BP: 116/61  Pulse: (!) 57  Weight: 184 lb 6.4 oz (83.6 kg)  Height:  5\' 5"  (1.651 m)   Body mass index is 30.69 kg/m.   Wt Readings from Last 3 Encounters:  06/28/22 184 lb 6.4 oz (83.6 kg)  05/29/22 183 lb 13.8 oz (83.4 kg)  05/07/22 184 lb (83.5 kg)     Ht Readings from Last 3 Encounters:  06/28/22 5\' 5"  (1.651 m)  05/29/22 5\' 5"  (1.651 m)  05/07/22 5\' 5"  (1.651 m)      General: The patient is awake, alert and appears not in acute distress. The patient is well groomed. Head: Normocephalic, atraumatic. Well healed biopsy scar from temporal art biopsy on 04-21-2022.  Neck is supple.Cardiovascular:  Regular rate and cardiac rhythm by pulse,  without distended neck veins. Respiratory: Lungs are clear to auscultation.  Skin:  With evidence of ankle edema, or rash. Trunk: The patient's posture is stooped    NEUROLOGIC EXAM: The patient is awake and alert, oriented to place and time.   Memory subjective described as iimpaired , concerned about that. mini-mental status exam    06/28/2022    9:43 AM 08/23/2020   11:29 AM 07/31/2017   11:21 AM  MMSE - Mini Mental State Exam  Orientation to time 5 5 5   Orientation to Place 5 5 5   Registration 3 3 3   Attention/ Calculation 2 1 3   Recall 3 3  2  Language- name 2 objects 2 2 2   Language- repeat 1 1 1   Language- follow 3 step command 3 3 3   Language- read & follow direction 1 1 1   Write a sentence 1 1 1   Copy design 1 0 0  Total score 27 25 26         02/21/2016    2:06 PM  Montreal Cognitive Assessment   Visuospatial/ Executive (0/5) 3  Naming (0/3) 3  Attention: Read list of digits (0/2) 2  Attention: Read list of letters (0/1) 0  Attention: Serial 7 subtraction starting at 100 (0/3) 0  Language: Repeat phrase (0/2) 2  Language : Fluency (0/1) 1  Abstraction (0/2) 2  Delayed Recall (0/5) 5  Orientation (0/6) 5  Total 23  Adjusted Score (based on education) 24    Speech with dysphonia and slowed speech Mood and affect are depressed   Cranial nerves: no loss of smell or taste reported   ; equal pupils- arcus senilis, almost un-reactive to light-  from 3 to 2mm. status post blepharoplasty-  Patient has diplopia with gaze to the left lower quadrant.  .  Face is symmetric without facial masking-  no nystagmus noted.    Mallampati is class 3.  Tongue protrudes centrally & palate elevates symmetrically.  Neck:  Supple.ring grossly intact.   Speech is clear , mildly hypophonic with no dysarthria noted.   There is no lip, neck or jaw tremor.   Neck is mildly rigid.  Mental Status:  The patient is awake, alert, oriented . His memory, language & knowledge are appropriate.   Sensory:  Fine touch, pinprick, vibration & temperature sensation were decreased at ankle level.  Motor exam:   Normal tone and normal muscle bulk and symmetric normal strength in all extremities.  No resting tremor, no postural or action tremor.  Gait : the patient cannot rise from a chair without bracing himself, he is in PT for that.  He walks slowly and with smaller steps, turns to either side 180 degrees with 4.5 steps/ small steps, no drift- with some reduction in bilateral arm swing. No tremor as he walks but he is borderline shuffling and his left leg "is weaker" and stiff , he reports.  Tandem gait impaired -  with ataxia.  Can toe walk and showed negative Romberg- he feels he is moving but stood still.     Cerebellar testing:  No dysmetria or intention tremor on finger-to-nose testing.   Arms were extended fully, clearly left sided pronator drift and drift downwards- but he has shoulder pain-  There is no truncal weakness.  Left finger to nose with ataxia and dysmetria.     ASSESSMENT AND PLAN 82 y.o. year old male  here with:    1) left body hemiataxia and weakness, related to cerebellar strokes, vertebral occlusion.  There have been no new strokes seen. His history of cerebellar strokes and white matter cerebral microvascular disease is likely cause for his memory decline.   2) Diplopia  persistent, he reports he just got used to it- present every day. Can be related to DM, steroid treatment with caveat - DM not well controlled.  3) daily headaches in right over left temple, biopsy was negative for arteritis, but CT positive for sinus disease and he saw Dr Annalee Genta.  4) vascular dementia is the most likely cause of disorientation spells, getting lost, forgetting and misplace  items around the house. Still driving- but shouldn't.  Unable to  keep track of his finances.  Prevagen may help, its never tested like a pharmaceutical product.  Continue with prevagen. He is in PT already, he feels its helping but not taking his problems away.  He is concerned about living alone. He would be well served with an assited living arrangement, but cannot afford it, he stated.     Lets see what a neuropsychologist will tell us, referral made today.  Referral made to headache specialist here in office, Dr Lucia Gaskins or dr Delena Bali.   I plan to follow up either personally or through our NP within 5 months.   I would like to thank Daisy Floro, MD for allowing me to meet with and to take care of this pleasant patient.   After spending a total time of  45  minutes face to face and additional time for physical and neurologic examination, review of laboratory studies,  personal review of imaging studies, reports and results of other testing and review of referral information / records as far as provided in visit,.  Electronically signed by: Melvyn Novas, MD 06/28/2022 10:12 AM  [facility] Board certified by The [employer] of Sleep Medicine and Diplomate of the [employer] of [employer]. Board certified In Neurology through the ABPN, Fellow of the [employer] of Neu[employer].

## 2022-06-28 NOTE — Patient Instructions (Signed)
Ataxia Ataxia is a condition that causes you to be unsteady on your feet when you walk or stand. It can also cause poor coordination of body movements and trouble standing up straight (upright). Ataxia can show up later in life (acquired ataxia), during your 20s or 30s, or into your 60s or later. It also may be present early in life (nonacquired ataxia).  What are the causes?  This condition may be caused by a problem with the part of the brain that deals with coordination and stability (cerebellum). It may form when a condition, such as a stroke or head injury, damages your cerebellum. Acquired ataxia may also be caused by: Neurodegenerative changes. These are changes to your nervous system. Systemic disorders. These are disorders that may affect your whole body. A lot of exposure to: Certain medicines. These include phenytoin and lithium. Solvents. These are cleaning fluids, such as paint thinner, nail polish remover, carpet cleaner, and degreasers. Certain conditions, such as: Alcohol use disorder. Celiac disease. Hypothyroidism. A lack (deficiency) of vitamin E, vitamin B12, or thiamine. An abnormal growth of cells in the brain (brain tumor). Multiple sclerosis. Cerebral palsy. Paraneoplastic syndromes. These are rare disorders. They are caused by the way the body's defense system (immune system) responds to cancer. Viral infections. The congenital and hereditary forms of this condition are caused by problems that are present in genes before birth. What are the signs or symptoms? Signs and symptoms may vary and depend on the cause of the condition. They may include: Being unsteady. Walking with your legs wide apart to keep your balance. Body movements that are not well coordinated. Trouble keeping an upright posture. Trouble writing. Shaking that you cannot control (tremors). Sudden muscle tightening (spasms). Other symptoms may include: Eye movements that you cannot  control. Changes in speech, vision, or both. Trouble swallowing. Tiredness (fatigue). How is this diagnosed? This condition may be diagnosed based on: Your medical history. Your family medical history. A physical exam. You may also have tests, such as: CT scan. MRI. Spinal tap. This is when a needle is used to take a sample of the fluid around your brain and spinal cord. It is also called a lumbar puncture. Genetic testing. Blood tests. How is this treated? Treatment for this condition depends on what is causing it. If the cause is a brain tumor, you may need surgery. Treatment also focuses on making your quality of life better. This may involve: Physical therapy. This helps you learn ways to improve coordination and move around more carefully. Occupational therapy. This can help you with doing daily tasks, such as bathing and feeding yourself. Using assistive devices. These are devices to help you move around, eat, or communicate. They include walkers, modified eating utensils, and communication aids. Speech therapy. This helps you learn ways to communicate and improve swallowing. Follow these instructions at home: Preventing falls Lie down right away if you become very unsteady, dizzy, or nauseous, or if you feel like you are going to faint. Do not get up until all of those feelings pass. Keep your home well-lit. Use night-lights as needed. Remove tripping hazards. These may include throw rugs, cords, and clutter. Install grab bars by the toilet and in the bathtub and shower. Use assistive devices, such as a cane, walker, or wheelchair, as needed to keep your balance. General instructions Take over-the-counter and prescription medicines only as told by your health care provider. Do not drink alcohol. Ask your health care provider what activities are safe for  you. Keep all follow-up appointments to make sure all your needs are being met and to catch any new problems early. Where to  find support National Ataxia Foundation: ataxia.org Contact a health care provider if: You have new symptoms. Your symptoms get worse. Get help right away if: Your unsteadiness gets worse all of a sudden. You have weakness or numbness on one side of your body. You feel confused. You have trouble speaking. You have: A severe headache. Chest pain. An irregular heartbeat. A very fast pulse. Pain in your abdomen. These symptoms may be an emergency. Get help right away. Call 911. Do not wait to see if the symptoms will go away. Do not drive yourself to the hospital. This information is not intended to replace advice given to you by your health care provider. Make sure you discuss any questions you have with your health care provider. Document Revised: 07/11/2021 Document Reviewed: 07/11/2021 Elsevier Patient Education  2024 [employer].

## 2022-06-29 DIAGNOSIS — R2681 Unsteadiness on feet: Secondary | ICD-10-CM | POA: Diagnosis not present

## 2022-06-29 DIAGNOSIS — R270 Ataxia, unspecified: Secondary | ICD-10-CM | POA: Diagnosis not present

## 2022-07-04 DIAGNOSIS — R2681 Unsteadiness on feet: Secondary | ICD-10-CM | POA: Diagnosis not present

## 2022-07-04 DIAGNOSIS — R270 Ataxia, unspecified: Secondary | ICD-10-CM | POA: Diagnosis not present

## 2022-07-04 IMAGING — CT CT RENAL STONE PROTOCOL
2 of 4 series · 16 of 46 positions shown, 18 images · non-contrast
Comparison: 12/26/2019

CLINICAL DATA: 80-year-old male with right-sided low back and flank
pain. Suspected nephrolithiasis.

EXAM:
CT ABDOMEN AND PELVIS WITHOUT CONTRAST
TECHNIQUE: Multidetector CT imaging of the abdomen and pelvis was performed
following the standard protocol without IV contrast.

[Series 2: axial st · axial · 0.98mm/px · z∈[-548,-18]mm · 13 of 116 slices shown, 15 images]
[im 5/116  soft-tissue]
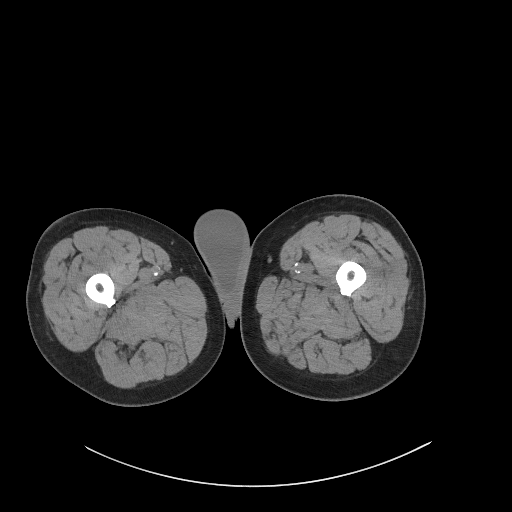
[im 5/116  bone]
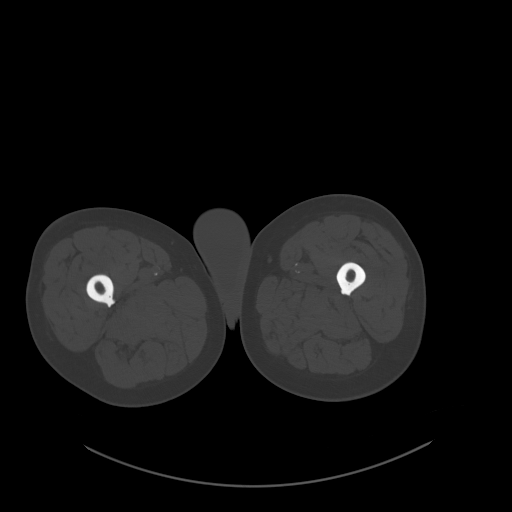
[im 14/116  soft-tissue]
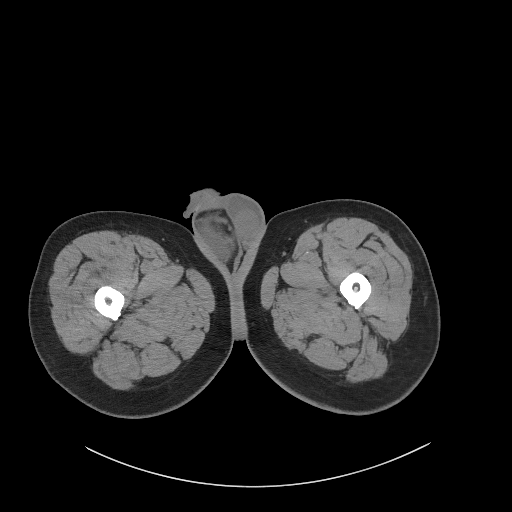
[im 23/116  soft-tissue]
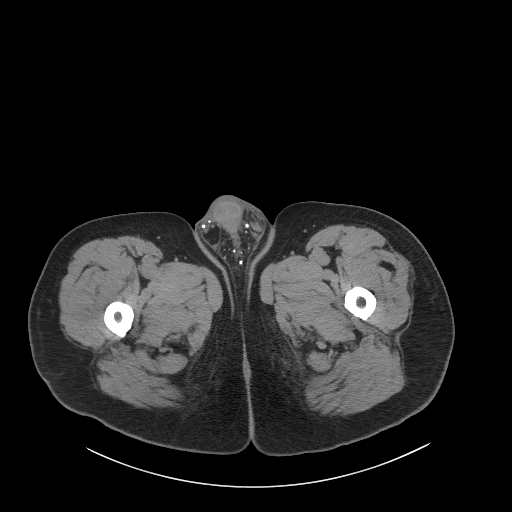
[im 31/116  soft-tissue]
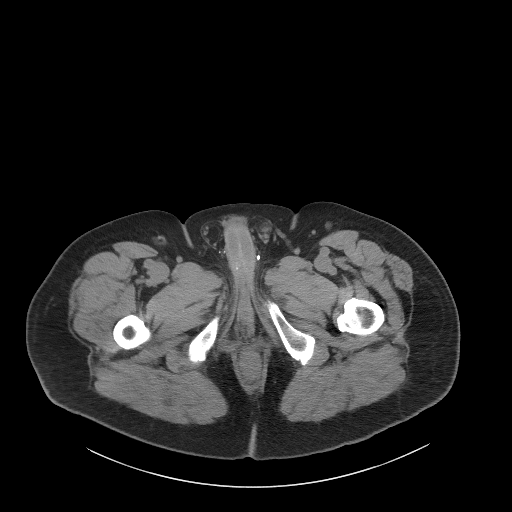
[im 40/116  soft-tissue]
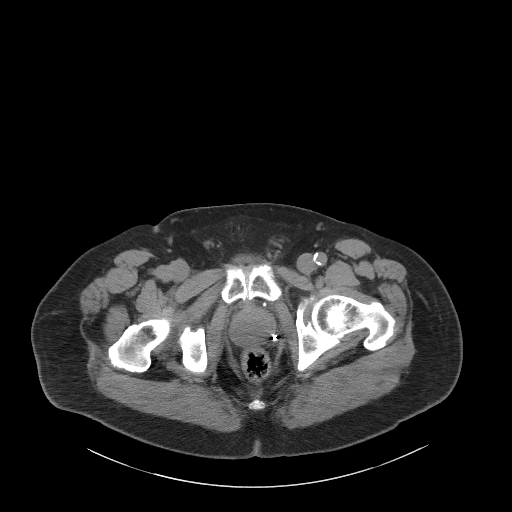
[im 49/116  soft-tissue]
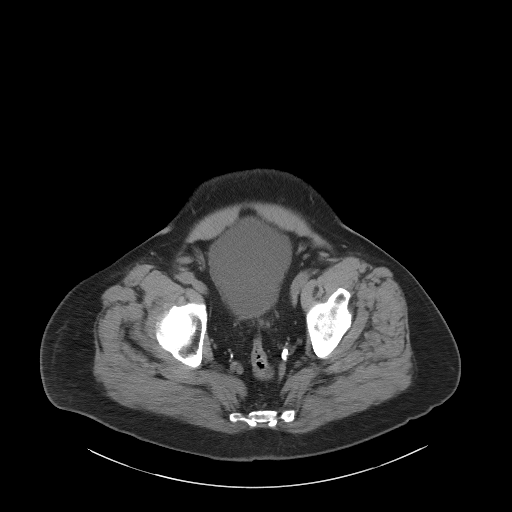
[im 58/116  soft-tissue]
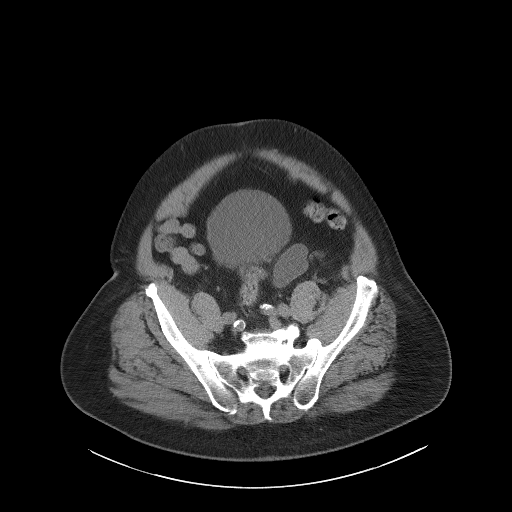
[im 67/116  soft-tissue]
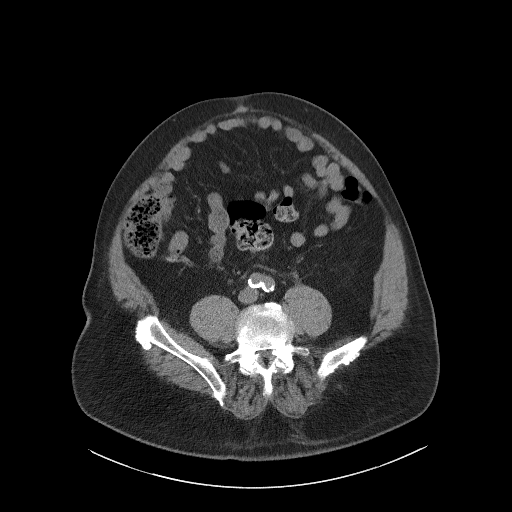
[im 76/116  soft-tissue]
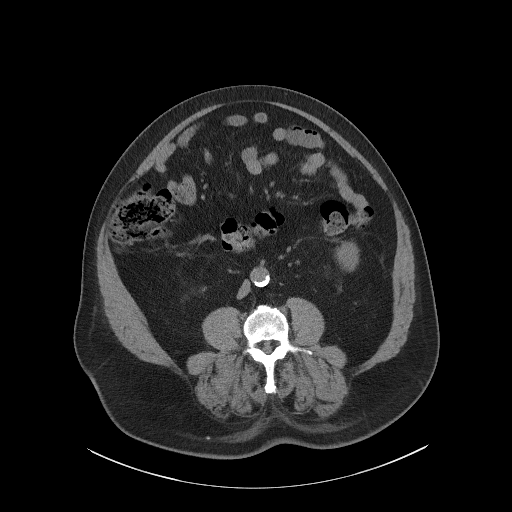
[im 76/116  bone]
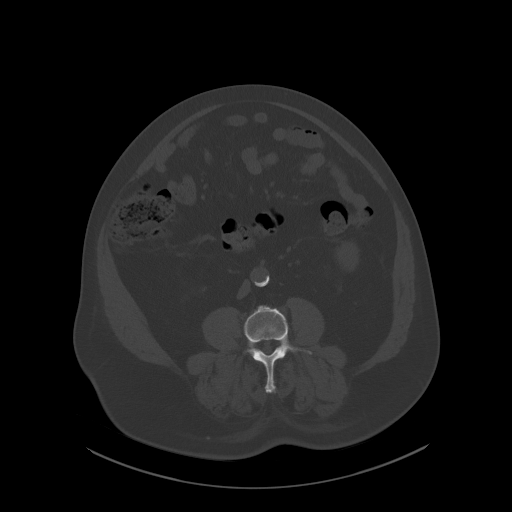
[im 85/116  soft-tissue]
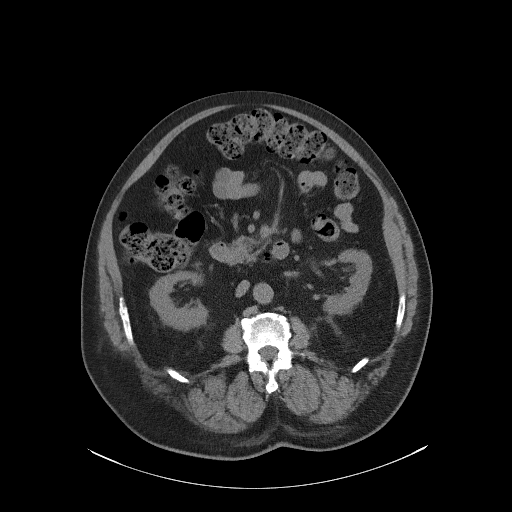
[im 93/116  soft-tissue]
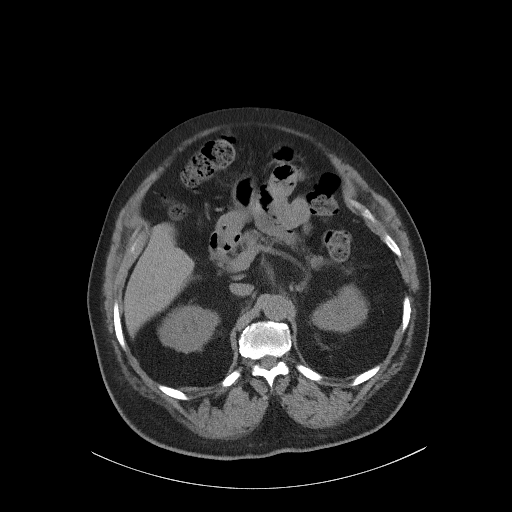
[im 102/116  soft-tissue]
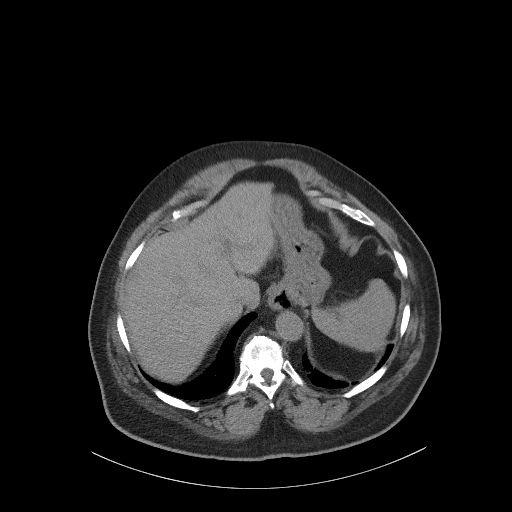
[im 111/116  soft-tissue]
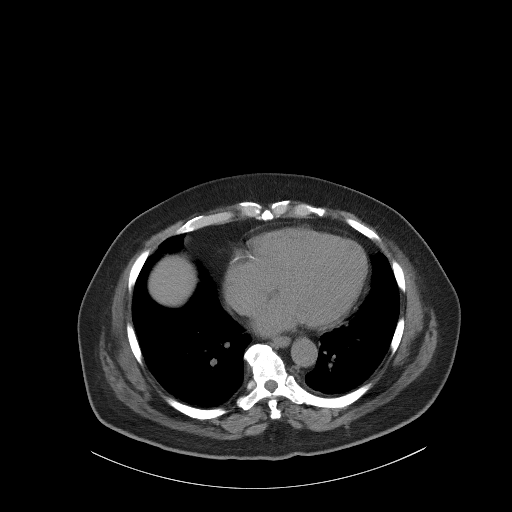

[Series 5: coronal st · coronal · 0.89mm/px · 3 of 125 slices shown]
[im 42/125  soft-tissue]
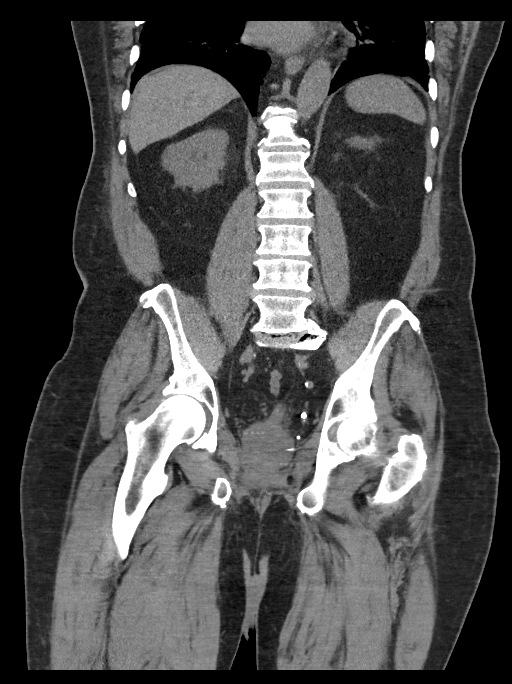
[im 56/125  soft-tissue]
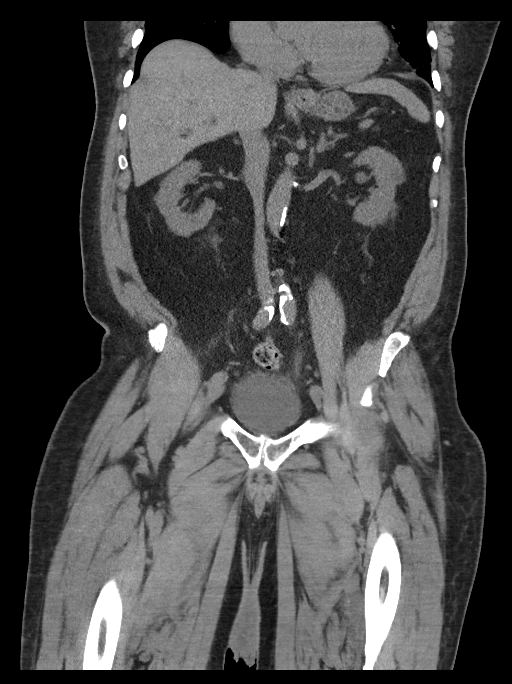
[im 69/125  soft-tissue]
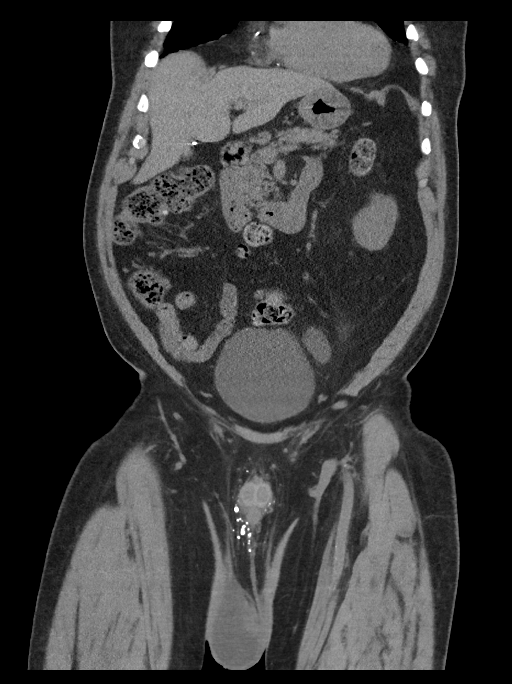

[16 of 46 positions shown; findings below may reference images not displayed]

FINDINGS: Lower chest: No acute abnormality.

Hepatobiliary: The liver is normal in size, contour, and
attenuation. Unchanged 1.4 cm hypoattenuating mass about the
subcapsular segment 8, again favored to likely represent benign
hemangioma. The gallbladder surgically absent. No intra or
extrahepatic biliary ductal dilation.

Pancreas: Unremarkable. No pancreatic ductal dilatation or
surrounding inflammatory changes.

Spleen: Normal in size without focal abnormality.

Adrenals/Urinary Tract: Adrenal glands are unremarkable. Left
interpolar simple cortical cyst measuring up to 16 mm. Kidneys are
normal, without renal calculi, focal lesion, or hydronephrosis. No
bladder wall thickening. Unchanged left bladder wall diverticulum.

Stomach/Bowel: Stomach is within normal limits. Appendix appears
normal. No evidence of bowel wall thickening, distention, or
inflammatory changes.

Vascular/Lymphatic: Aortic atherosclerosis. No enlarged abdominal or
pelvic lymph nodes.

Reproductive: Prostate is unremarkable. Right greater than left
bilateral hydrocele.

Other: No abdominal wall hernia or abnormality. No abdominopelvic
ascites.

Musculoskeletal: Multilevel degenerative changes of the
thoracolumbar spine. No acute osseous abnormality or aggressive
appearing osseous lesion.
IMPRESSION: No acute abdominopelvic abnormality, nephrolithiasis, or
hydronephrosis.

## 2022-07-10 DIAGNOSIS — E1142 Type 2 diabetes mellitus with diabetic polyneuropathy: Secondary | ICD-10-CM | POA: Diagnosis not present

## 2022-07-10 DIAGNOSIS — L603 Nail dystrophy: Secondary | ICD-10-CM | POA: Diagnosis not present

## 2022-07-10 DIAGNOSIS — I739 Peripheral vascular disease, unspecified: Secondary | ICD-10-CM | POA: Diagnosis not present

## 2022-07-10 DIAGNOSIS — E1151 Type 2 diabetes mellitus with diabetic peripheral angiopathy without gangrene: Secondary | ICD-10-CM | POA: Diagnosis not present

## 2022-07-11 DIAGNOSIS — M25512 Pain in left shoulder: Secondary | ICD-10-CM | POA: Diagnosis not present

## 2022-07-14 ENCOUNTER — Ambulatory Visit: Payer: Medicare Other | Admitting: Physical Therapy

## 2022-07-14 DIAGNOSIS — R42 Dizziness and giddiness: Secondary | ICD-10-CM

## 2022-07-14 DIAGNOSIS — R2681 Unsteadiness on feet: Secondary | ICD-10-CM

## 2022-07-14 NOTE — Therapy (Signed)
[facility] [address] [place], [place], [address] Phone: [phone]   Fax:  [phone]  Patient Details  Name: Steve Brown MRN: [id number] Date of Birth: Jun 05, 1940 Referring Provider:  Daisy Floro, MD  Encounter Date: 07/14/2022  Session was arrive no charge. Patient is still being seen at another PT clinic for similar diagnosis. Provided written instructions that will need to formally D/C from other clinic before being seen at this clinic. Also educated on when he will need a new referral.    Carmelia Bake, PT, DPT 07/14/2022, 10:52 AM  [facility] [address] [place], [place], [address] Phone: [phone]   Fax:  [phone]

## 2022-07-19 ENCOUNTER — Telehealth: Payer: Self-pay | Admitting: Adult Health

## 2022-07-19 ENCOUNTER — Encounter: Payer: Self-pay | Admitting: Adult Health

## 2022-07-19 DIAGNOSIS — R2681 Unsteadiness on feet: Secondary | ICD-10-CM | POA: Diagnosis not present

## 2022-07-19 DIAGNOSIS — R270 Ataxia, unspecified: Secondary | ICD-10-CM | POA: Diagnosis not present

## 2022-07-19 NOTE — Telephone Encounter (Signed)
LVM and sent letter in mail informing pt of need to reschedule 01/08/23 appt - NP out

## 2022-07-21 DIAGNOSIS — R270 Ataxia, unspecified: Secondary | ICD-10-CM | POA: Diagnosis not present

## 2022-07-21 DIAGNOSIS — R2681 Unsteadiness on feet: Secondary | ICD-10-CM | POA: Diagnosis not present

## 2022-07-24 DIAGNOSIS — R2681 Unsteadiness on feet: Secondary | ICD-10-CM | POA: Diagnosis not present

## 2022-07-24 DIAGNOSIS — R270 Ataxia, unspecified: Secondary | ICD-10-CM | POA: Diagnosis not present

## 2022-07-26 DIAGNOSIS — R2681 Unsteadiness on feet: Secondary | ICD-10-CM | POA: Diagnosis not present

## 2022-07-26 DIAGNOSIS — R270 Ataxia, unspecified: Secondary | ICD-10-CM | POA: Diagnosis not present

## 2022-07-31 DIAGNOSIS — R270 Ataxia, unspecified: Secondary | ICD-10-CM | POA: Diagnosis not present

## 2022-07-31 DIAGNOSIS — R2681 Unsteadiness on feet: Secondary | ICD-10-CM | POA: Diagnosis not present

## 2022-08-02 DIAGNOSIS — Z8546 Personal history of malignant neoplasm of prostate: Secondary | ICD-10-CM | POA: Diagnosis not present

## 2022-08-02 DIAGNOSIS — R310 Gross hematuria: Secondary | ICD-10-CM | POA: Diagnosis not present

## 2022-08-03 DIAGNOSIS — M5416 Radiculopathy, lumbar region: Secondary | ICD-10-CM | POA: Diagnosis not present

## 2022-08-06 ENCOUNTER — Other Ambulatory Visit: Payer: Self-pay

## 2022-08-06 ENCOUNTER — Inpatient Hospital Stay (HOSPITAL_COMMUNITY)
Admission: EM | Admit: 2022-08-06 | Discharge: 2022-08-08 | Disposition: A | Discharge status: Home / Self Care | Payer: Medicare Other | Attending: Internal Medicine | Admitting: Internal Medicine

## 2022-08-06 ENCOUNTER — Encounter (HOSPITAL_COMMUNITY): Payer: Self-pay

## 2022-08-06 ENCOUNTER — Emergency Department (HOSPITAL_COMMUNITY): Payer: Medicare Other

## 2022-08-06 DIAGNOSIS — R55 Syncope and collapse: Principal | ICD-10-CM | POA: Diagnosis present

## 2022-08-06 DIAGNOSIS — E118 Type 2 diabetes mellitus with unspecified complications: Secondary | ICD-10-CM | POA: Diagnosis not present

## 2022-08-06 DIAGNOSIS — Z888 Allergy status to other drugs, medicaments and biological substances status: Secondary | ICD-10-CM

## 2022-08-06 DIAGNOSIS — E1149 Type 2 diabetes mellitus with other diabetic neurological complication: Secondary | ICD-10-CM

## 2022-08-06 DIAGNOSIS — Z91041 Radiographic dye allergy status: Secondary | ICD-10-CM | POA: Diagnosis not present

## 2022-08-06 DIAGNOSIS — M4802 Spinal stenosis, cervical region: Secondary | ICD-10-CM | POA: Diagnosis not present

## 2022-08-06 DIAGNOSIS — I129 Hypertensive chronic kidney disease with stage 1 through stage 4 chronic kidney disease, or unspecified chronic kidney disease: Secondary | ICD-10-CM | POA: Diagnosis present

## 2022-08-06 DIAGNOSIS — E669 Obesity, unspecified: Secondary | ICD-10-CM | POA: Diagnosis not present

## 2022-08-06 DIAGNOSIS — Z809 Family history of malignant neoplasm, unspecified: Secondary | ICD-10-CM

## 2022-08-06 DIAGNOSIS — R001 Bradycardia, unspecified: Secondary | ICD-10-CM | POA: Diagnosis not present

## 2022-08-06 DIAGNOSIS — Z823 Family history of stroke: Secondary | ICD-10-CM

## 2022-08-06 DIAGNOSIS — Z8546 Personal history of malignant neoplasm of prostate: Secondary | ICD-10-CM

## 2022-08-06 DIAGNOSIS — Y9222 Religious institution as the place of occurrence of the external cause: Secondary | ICD-10-CM

## 2022-08-06 DIAGNOSIS — I10 Essential (primary) hypertension: Secondary | ICD-10-CM | POA: Diagnosis not present

## 2022-08-06 DIAGNOSIS — S0003XA Contusion of scalp, initial encounter: Secondary | ICD-10-CM | POA: Diagnosis present

## 2022-08-06 DIAGNOSIS — Z79899 Other long term (current) drug therapy: Secondary | ICD-10-CM

## 2022-08-06 DIAGNOSIS — E1140 Type 2 diabetes mellitus with diabetic neuropathy, unspecified: Secondary | ICD-10-CM | POA: Diagnosis present

## 2022-08-06 DIAGNOSIS — R7989 Other specified abnormal findings of blood chemistry: Secondary | ICD-10-CM | POA: Diagnosis present

## 2022-08-06 DIAGNOSIS — E1165 Type 2 diabetes mellitus with hyperglycemia: Secondary | ICD-10-CM | POA: Diagnosis present

## 2022-08-06 DIAGNOSIS — I517 Cardiomegaly: Secondary | ICD-10-CM | POA: Diagnosis not present

## 2022-08-06 DIAGNOSIS — S0990XA Unspecified injury of head, initial encounter: Secondary | ICD-10-CM | POA: Diagnosis not present

## 2022-08-06 DIAGNOSIS — Z86711 Personal history of pulmonary embolism: Secondary | ICD-10-CM | POA: Diagnosis present

## 2022-08-06 DIAGNOSIS — Z8673 Personal history of transient ischemic attack (TIA), and cerebral infarction without residual deficits: Secondary | ICD-10-CM | POA: Diagnosis not present

## 2022-08-06 DIAGNOSIS — I2510 Atherosclerotic heart disease of native coronary artery without angina pectoris: Secondary | ICD-10-CM | POA: Diagnosis present

## 2022-08-06 DIAGNOSIS — Z7901 Long term (current) use of anticoagulants: Secondary | ICD-10-CM

## 2022-08-06 DIAGNOSIS — Z832 Family history of diseases of the blood and blood-forming organs and certain disorders involving the immune mechanism: Secondary | ICD-10-CM

## 2022-08-06 DIAGNOSIS — F39 Unspecified mood [affective] disorder: Secondary | ICD-10-CM | POA: Diagnosis present

## 2022-08-06 DIAGNOSIS — R918 Other nonspecific abnormal finding of lung field: Secondary | ICD-10-CM | POA: Diagnosis not present

## 2022-08-06 DIAGNOSIS — W19XXXA Unspecified fall, initial encounter: Secondary | ICD-10-CM | POA: Diagnosis present

## 2022-08-06 DIAGNOSIS — Z794 Long term (current) use of insulin: Secondary | ICD-10-CM | POA: Diagnosis not present

## 2022-08-06 DIAGNOSIS — N179 Acute kidney failure, unspecified: Secondary | ICD-10-CM | POA: Diagnosis present

## 2022-08-06 DIAGNOSIS — Z6830 Body mass index (BMI) 30.0-30.9, adult: Secondary | ICD-10-CM

## 2022-08-06 DIAGNOSIS — H903 Sensorineural hearing loss, bilateral: Secondary | ICD-10-CM | POA: Diagnosis present

## 2022-08-06 DIAGNOSIS — E785 Hyperlipidemia, unspecified: Secondary | ICD-10-CM | POA: Diagnosis present

## 2022-08-06 DIAGNOSIS — G4733 Obstructive sleep apnea (adult) (pediatric): Secondary | ICD-10-CM | POA: Diagnosis not present

## 2022-08-06 DIAGNOSIS — N1831 Chronic kidney disease, stage 3a: Secondary | ICD-10-CM | POA: Diagnosis present

## 2022-08-06 DIAGNOSIS — E11649 Type 2 diabetes mellitus with hypoglycemia without coma: Secondary | ICD-10-CM | POA: Diagnosis present

## 2022-08-06 DIAGNOSIS — Z955 Presence of coronary angioplasty implant and graft: Secondary | ICD-10-CM

## 2022-08-06 DIAGNOSIS — J9811 Atelectasis: Secondary | ICD-10-CM | POA: Diagnosis not present

## 2022-08-06 DIAGNOSIS — E1122 Type 2 diabetes mellitus with diabetic chronic kidney disease: Secondary | ICD-10-CM | POA: Diagnosis present

## 2022-08-06 DIAGNOSIS — N400 Enlarged prostate without lower urinary tract symptoms: Secondary | ICD-10-CM | POA: Diagnosis present

## 2022-08-06 DIAGNOSIS — Z820 Family history of epilepsy and other diseases of the nervous system: Secondary | ICD-10-CM

## 2022-08-06 DIAGNOSIS — R42 Dizziness and giddiness: Secondary | ICD-10-CM | POA: Diagnosis not present

## 2022-08-06 DIAGNOSIS — Z981 Arthrodesis status: Secondary | ICD-10-CM

## 2022-08-06 DIAGNOSIS — R778 Other specified abnormalities of plasma proteins: Secondary | ICD-10-CM | POA: Diagnosis not present

## 2022-08-06 DIAGNOSIS — Z885 Allergy status to narcotic agent status: Secondary | ICD-10-CM

## 2022-08-06 DIAGNOSIS — Z7984 Long term (current) use of oral hypoglycemic drugs: Secondary | ICD-10-CM

## 2022-08-06 DIAGNOSIS — I959 Hypotension, unspecified: Secondary | ICD-10-CM | POA: Diagnosis not present

## 2022-08-06 DIAGNOSIS — S199XXA Unspecified injury of neck, initial encounter: Secondary | ICD-10-CM | POA: Diagnosis not present

## 2022-08-06 LAB — RAPID URINE DRUG SCREEN, HOSP PERFORMED
Amphetamines: NOT DETECTED
Barbiturates: NOT DETECTED
Benzodiazepines: NOT DETECTED
Cocaine: NOT DETECTED
Opiates: NOT DETECTED
Tetrahydrocannabinol: NOT DETECTED

## 2022-08-06 LAB — CBC
HCT: 35.6 % — ABNORMAL LOW (ref 39.0–52.0)
Hemoglobin: 11.8 g/dL — ABNORMAL LOW (ref 13.0–17.0)
MCH: 31.1 pg (ref 26.0–34.0)
MCHC: 33.1 g/dL (ref 30.0–36.0)
MCV: 93.9 fL (ref 80.0–100.0)
Platelets: 143 10*3/uL — ABNORMAL LOW (ref 150–400)
RBC: 3.79 MIL/uL — ABNORMAL LOW (ref 4.22–5.81)
RDW: 13.4 % (ref 11.5–15.5)
WBC: 4.4 10*3/uL (ref 4.0–10.5)
nRBC: 0 % (ref 0.0–0.2)

## 2022-08-06 LAB — CBG MONITORING, ED: Glucose-Capillary: 76 mg/dL (ref 70–99)

## 2022-08-06 LAB — PROTIME-INR
INR: 2.2 — ABNORMAL HIGH (ref 0.8–1.2)
Prothrombin Time: 24.5 seconds — ABNORMAL HIGH (ref 11.4–15.2)

## 2022-08-06 LAB — COMPREHENSIVE METABOLIC PANEL
ALT: 12 U/L (ref 0–44)
AST: 17 U/L (ref 15–41)
Albumin: 2.9 g/dL — ABNORMAL LOW (ref 3.5–5.0)
Alkaline Phosphatase: 79 U/L (ref 38–126)
Anion gap: 8 (ref 5–15)
BUN: 40 mg/dL — ABNORMAL HIGH (ref 8–23)
CO2: 26 mmol/L (ref 22–32)
Calcium: 8.2 mg/dL — ABNORMAL LOW (ref 8.9–10.3)
Chloride: 104 mmol/L (ref 98–111)
Creatinine, Ser: 1.8 mg/dL — ABNORMAL HIGH (ref 0.61–1.24)
GFR, Estimated: 38 mL/min — ABNORMAL LOW (ref >60)
Glucose, Bld: 141 mg/dL — ABNORMAL HIGH (ref 70–99)
Potassium: 4.1 mmol/L (ref 3.5–5.1)
Sodium: 138 mmol/L (ref 135–145)
Total Bilirubin: 0.5 mg/dL (ref 0.3–1.2)
Total Protein: 5.8 g/dL — ABNORMAL LOW (ref 6.5–8.1)

## 2022-08-06 LAB — TROPONIN I (HIGH SENSITIVITY)
Troponin I (High Sensitivity): 63 ng/L — ABNORMAL HIGH (ref <18)
Troponin I (High Sensitivity): 70 ng/L — ABNORMAL HIGH (ref <18)

## 2022-08-06 LAB — GLUCOSE, CAPILLARY: Glucose-Capillary: 224 mg/dL — ABNORMAL HIGH (ref 70–99)

## 2022-08-06 MED ORDER — INSULIN ASPART 100 UNIT/ML IJ SOLN
0.0000 [IU] | Freq: Every day | INTRAMUSCULAR | Status: DC
  Administered 2022-08-06: 2 [IU] via SUBCUTANEOUS

## 2022-08-06 MED ORDER — INSULIN ASPART 100 UNIT/ML IJ SOLN: 0.0000 [IU] | Freq: Three times a day (TID) | INTRAMUSCULAR | Status: DC

## 2022-08-06 MED ORDER — ONDANSETRON HCL 4 MG/2ML IJ SOLN: 4.0000 mg | Freq: Four times a day (QID) | INTRAMUSCULAR | Status: DC | PRN

## 2022-08-06 MED ORDER — RIVAROXABAN 20 MG PO TABS
20.0000 mg | ORAL_TABLET | Freq: Every day | ORAL | Status: DC
  Administered 2022-08-06 – 2022-08-07 (×2): 20 mg via ORAL
  Filled 2022-08-06 (×2): qty 1

## 2022-08-06 MED ORDER — SODIUM CHLORIDE 0.9 % IV SOLN: INTRAVENOUS | Status: DC

## 2022-08-06 MED ORDER — ACETAMINOPHEN 325 MG PO TABS: 650.0000 mg | ORAL_TABLET | Freq: Four times a day (QID) | ORAL | Status: DC | PRN

## 2022-08-06 MED ORDER — ONDANSETRON HCL 4 MG PO TABS: 4.0000 mg | ORAL_TABLET | Freq: Four times a day (QID) | ORAL | Status: DC | PRN

## 2022-08-06 MED ORDER — ACETAMINOPHEN 650 MG RE SUPP: 650.0000 mg | Freq: Four times a day (QID) | RECTAL | Status: DC | PRN

## 2022-08-06 MED ORDER — ACETAMINOPHEN 325 MG PO TABS
650.0000 mg | ORAL_TABLET | Freq: Once | ORAL | Status: AC
  Administered 2022-08-06: 650 mg via ORAL
  Filled 2022-08-06: qty 2

## 2022-08-06 NOTE — ED Notes (Signed)
ED TO INPATIENT HANDOFF REPORT  ED Nurse Name and Phone #: Unknown Foley  S Name/Age/Gender Steve Brown 82 y.o. male Room/Bed: 019C/019C  Code Status   Code Status: Full Code  Home/SNF/Other Home Patient oriented to: self, place, time, and situation Is this baseline? Yes   Triage Complete: Triage complete  Chief Complaint Syncope [R55]  Triage Note Per EMS, Pt, from church, presents after a syncopal episode.  Pt hit posterior head during fall.  Pain score 3/10.  Pt reports taking Xarelto.  Pt was orthostatic and reported dizziness when sat up by EMS.    No bleeding noted.  Pt is A&Ox4.       Allergies Allergies  Allergen Reactions   Other Other (See Comments)    Per patient- cardiac cath dye-  "woke up during procedure hysterical."   Phenergan [Promethazine] Other (See Comments)    Mood changes    Iohexol Other (See Comments)    Patient refuses contrast after having a hysterical event in hospital //r ls spoke with patient    Phenergan [Promethazine]     Other reaction(s): syncope   Tramadol     Increases Blood Sugar     Level of Care/Admitting Diagnosis ED Disposition     ED Disposition  Admit   Condition  --   Comment  Hospital Area: [facility] [[id number]]  Level of Care: Telemetry Medical [104]  May place patient in observation at [facility] or [facility] if equivalent level of care is available:: No  Covid Evaluation: Asymptomatic - no recent exposure (last 10 days) testing not required  Diagnosis: Syncope [206001]  Admitting Physician: Almon Hercules [[id number]]  Attending Physician: Almon Hercules [[id number]]          B Medical/Surgery History Past Medical History:  Diagnosis Date   Abnormal prostate biopsy    Anticoagulant long-term use    currently xarelto   Anxiety    BPH with elevated PSA    CKD (chronic kidney disease), stage II    Complication of anesthesia    limted neck rom limited use of left arm due to cva    Coronary artery disease    CARDIOLOGIST-  DR Eldridge Dace--  2010-- PCI w/ stenting midLAD   DDD (degenerative disc disease), lumbar    Degeneration of cervical intervertebral disc    Depression    Diabetes mellitus without complication (HCC)    Type 2   Dyspnea on exertion    GERD (gastroesophageal reflux disease)    Hemiparesis due to cerebral infarction    History of cerebrovascular accident (CVA) with residual deficit 2002 and 2003--  hemiparisis both sides   per MRI  anterior left frontal lobe, left para midline pons, and inferior cerebullam bilaterally infarcts   History of pulmonary embolus (PE)    06-30-2012  extensive bilaterally   History of recurrent TIAs    History of syncope    hx multiple pre-syncope and syncopal episodes due to vasovagal, orthostatic hypotension, dehydration   History of TIAs    several since 2002   Hyperlipidemia    Hypertension    Mild atherosclerosis of carotid artery, bilateral    per last duplex 11-04-2014  bilateral ICA 1--39%   Neuropathy    fingers   OSA on CPAP    uses most nights; followed by dr dohmeier--  sev. osa w/ AHI 65.9   Pneumonia    x 1   Prostate cancer (HCC) dx 2018   Renal insufficiency  S/P coronary artery stent placement 2010   stenting to mid LAD   Sensorineural hearing loss, bilateral 04/01/2021   no hearing aids   Simple renal cyst    bilaterally   Stroke [facility])    Trigger finger of both hands 11/17/2013   Type 2 diabetes mellitus (HCC) dx 1986   last one A1c 9.2 on 04-26-2016   Unsteady gait    uses straight cane and occasional walker. Hx prior CVA/TIAs;   Vertebral artery occlusion, left    chronic   Past Surgical History:  Procedure Laterality Date   ANTERIOR CERVICAL DECOMP/DISCECTOMY FUSION  2004   C3 -- C6 limited rom   ARTERY BIOPSY Right 04/21/2022   Procedure: BIOPSY TEMPORAL ARTERY;  Surgeon: Cephus Shelling, MD;  [facility] OR;  Service: Vascular;  Laterality: Right;   CARDIAC CATHETERIZATION   06-10-2010   dr Eldridge Dace   wide patent LAD stent, mid lesion at the origin of the septal prior to the previous stent 40-50%/  normal LVF, ef 55%   CARDIOVASCULAR STRESS TEST  10-23-2012  dr Eldridge Dace   normal nuclear perfusion study w/ no ischemia/  normal LV function and wall motion , ef 65%   CARPAL TUNNEL RELEASE Bilateral    CATARACT EXTRACTION W/ INTRAOCULAR LENS  IMPLANT, BILATERAL     CHOLECYSTECTOMY N/A 11/02/2015   Procedure: LAPAROSCOPIC CHOLECYSTECTOMY WITH INTRAOPERATIVE CHOLANGIOGRAM;  Surgeon: Manus Rudd, MD;  Location: [facility] OR;  Service: General;  Laterality: N/A;   COLONOSCOPY     CORONARY ANGIOPLASTY WITH STENT PLACEMENT  02/2008   stenting to mid LAD   GOLD SEED IMPLANT N/A 11/15/2016   Procedure: GOLD SEED IMPLANT TIMES THREE;  Surgeon: Crist Fat, MD;  Location: [facility];  Service: Urology;  Laterality: N/A;   IR ANGIO INTRA EXTRACRAN SEL COM CAROTID INNOMINATE BILAT MOD SED  06/13/2018   IR ANGIO VERTEBRAL SEL VERTEBRAL UNI R MOD SED  06/13/2018   IR US GUIDE VASC ACCESS RIGHT  06/13/2018   LEFT HEART CATH AND CORONARY ANGIOGRAPHY N/A 05/25/2017   Procedure: LEFT HEART CATH AND CORONARY ANGIOGRAPHY;  Surgeon: Corky Crafts, MD;  Location: [facility] INVASIVE CV LAB;  Service: Cardiovascular;  Laterality: N/A;   LEFT HEART CATHETERIZATION WITH CORONARY ANGIOGRAM N/A 04/03/2013   Procedure: LEFT HEART CATHETERIZATION WITH CORONARY ANGIOGRAM;  Surgeon: Corky Crafts, MD;  Location: [facility] CATH LAB;  Service: Cardiovascular;  Laterality: N/A;  patent mLAD stent  w/ mild disease in remainder LAD and its branches;  mod. focal lesion midLCFx- FFR of lesion was negative for ischemia/  normal LVSF, ef 50%   lungs  2005   "fluid pumped off lungs"   NEUROPLASTY / TRANSPOSITION ULNAR NERVE AT ELBOW Right 2004   PROSTATE BIOPSY N/A 08/31/2016   Procedure: PROSTATE  BIOPSY TRANSRECTAL ULTRASONIC PROSTATE (TUBP);  Surgeon: Crist Fat, MD;  [facility];  Service: Urology;  Laterality: N/A;   SPACE OAR INSTILLATION N/A 11/15/2016   Procedure: SPACE OAR INSTILLATION;  Surgeon: Crist Fat, MD;  Location: [facility];  Service: Urology;  Laterality: N/A;   TRANSTHORACIC ECHOCARDIOGRAM  04/27/2016   severe focal basal LVH, ef 60-65%,  grade 2 diastoilc dysfunction/  mild AR, MR, and TR/  atrial septum lipomatous hypertrophy/  PASP   UMBILICAL HERNIA REPAIR       A IV Location/Drains/Wounds Patient Lines/Drains/Airways Status     Active Line/Drains/Airways     Name Placement date Placement  time Site Days   Peripheral IV 08/06/22 20 G Posterior;Right Wrist 08/06/22  1251  Wrist  less than 1            Intake/Output Last 24 hours  Intake/Output Summary (Last 24 hours) at 08/06/2022 1715 Last data filed at 08/06/2022 1705 Gross per 24 hour  Intake --  Output 400 ml  Net -400 ml    Labs/Imaging Results for orders placed or performed during the hospital encounter of 08/06/22 (from the past 48 hour(s))  CBC     Status: Abnormal   Collection Time: 08/06/22 12:51 PM  Result Value Ref Range   WBC 4.4 4.0 - 10.5 K/uL   RBC 3.79 (L) 4.22 - 5.81 MIL/uL   Hemoglobin 11.8 (L) 13.0 - 17.0 g/dL   HCT 16.1 (L) 09.6 - 04.5 %   MCV 93.9 80.0 - 100.0 fL   MCH 31.1 26.0 - 34.0 pg   MCHC 33.1 30.0 - 36.0 g/dL   RDW 40.9 81.1 - 91.4 %   Platelets 143 (L) 150 - 400 K/uL   nRBC 0.0 0.0 - 0.2 %    Comment: Performed at [facility], [address], [place], [place] [address]  Comprehensive metabolic panel     Status: Abnormal   Collection Time: 08/06/22 12:51 PM  Result Value Ref Range   Sodium 138 135 - 145 mmol/L   Potassium 4.1 3.5 - 5.1 mmol/L   Chloride 104 98 - 111 mmol/L   CO2 26 22 - 32 mmol/L   Glucose, Bld 141 (H) 70 - 99 mg/dL    Comment: Glucose reference range applies only to samples taken after fasting for at least 8 hours.   BUN 40 (H) 8 - 23 mg/dL   Creatinine, Ser  6.21 (H) 0.61 - 1.24 mg/dL   Calcium 8.2 (L) 8.9 - 10.3 mg/dL   Total Protein 5.8 (L) 6.5 - 8.1 g/dL   Albumin 2.9 (L) 3.5 - 5.0 g/dL   AST 17 15 - 41 U/L   ALT 12 0 - 44 U/L   Alkaline Phosphatase 79 38 - 126 U/L   Total Bilirubin 0.5 0.3 - 1.2 mg/dL   GFR, Estimated 38 (L) >60 mL/min    Comment: (NOTE) Calculated using the CKD-EPI Creatinine Equation (2021)    Anion gap 8 5 - 15    Comment: Performed at [facility], [address], [place], [place] [address]  Protime-INR     Status: Abnormal   Collection Time: 08/06/22 12:51 PM  Result Value Ref Range   Prothrombin Time 24.5 (H) 11.4 - 15.2 seconds   INR 2.2 (H) 0.8 - 1.2    Comment: (NOTE) INR goal varies based on device and disease states. Performed at [facility], [address] [place], [place] [address]   Troponin I (High Sensitivity)     Status: Abnormal   Collection Time: 08/06/22 12:51 PM  Result Value Ref Range   Troponin I (High Sensitivity) 63 (H) <18 ng/L    Comment: (NOTE) Elevated high sensitivity troponin I (hsTnI) values and significant  changes across serial measurements may suggest ACS but many other  chronic and acute conditions are known to elevate hsTnI results.  Refer to the "Links" section for chest pain algorithms and additional  guidance. Performed at [facility], [address], [place], [place] [address]   Troponin I (High Sensitivity)     Status: Abnormal   Collection Time: 08/06/22  3:29  PM  Result Value Ref Range   Troponin I (High Sensitivity) 70 (H) <18 ng/L    Comment: (NOTE) Elevated high sensitivity troponin I (hsTnI) values and significant  changes across serial measurements may suggest ACS but many other  chronic and acute conditions are known to elevate hsTnI results.  Refer to the "Links" section for chest pain algorithms and additional  guidance. Performed at [facility], [address] [place], [place] [address]   CBG monitoring, ED     Status:  None   Collection Time: 08/06/22  5:09 PM  Result Value Ref Range   Glucose-Capillary 76 70 - 99 mg/dL    Comment: Glucose reference range applies only to samples taken after fasting for at least 8 hours.   CT Cervical Spine Wo Contrast  Result Date: 08/06/2022 CLINICAL DATA:  Neck trauma. Syncopal episode. Patient struck head. EXAM: CT CERVICAL SPINE WITHOUT CONTRAST TECHNIQUE: Multidetector CT imaging of the cervical spine was performed without intravenous contrast. Multiplanar CT image reconstructions were also generated. RADIATION DOSE REDUCTION: This exam was performed according to the departmental dose-optimization program which includes automated exposure control, adjustment of the mA and/or kV according to patient size and/or use of iterative reconstruction technique. COMPARISON:  CT of the cervical spine 09/03/2019 FINDINGS: Alignment: No significant listhesis is present. Straightening of the normal cervical lordosis is again seen. Skull base and vertebrae: Corpectomy strut grafting noted from C2 through C5. Ankylosis is present across the C5-6 disc space. Hardware is intact and stable. No acute fractures are present. Soft tissues and spinal canal: Atherosclerotic calcifications are present at the carotid bifurcations bilaterally without definite stenosis. No prevertebral fluid or swelling is present. Visible canal hematoma is present. Disc levels: Uncovertebral spurring results in moderate foraminal narrowing bilaterally at C6-7, right greater than left. Uncovertebral spurring contributes to moderate bilateral foraminal narrowing at C7-T1. Upper chest: The lung apices are clear. The thoracic inlet is within normal limits. IMPRESSION: 1. No acute fracture or traumatic subluxation. 2. Corpectomy strut grafting from C2 through C5. 3. Ankylosis across the C5-6 disc space. 4. Moderate foraminal narrowing bilaterally at C6-7 and C7-T1. Electronically Signed   By: Marin Roberts M.D.   On:  08/06/2022 13:32   CT Head Wo Contrast  Result Date: 08/06/2022 CLINICAL DATA:  Head trauma, minor. Syncopal episode. Patient hit head. Patient is on Xarelto. EXAM: CT HEAD WITHOUT CONTRAST TECHNIQUE: Contiguous axial images were obtained from the base of the skull through the vertex without intravenous contrast. RADIATION DOSE REDUCTION: This exam was performed according to the departmental dose-optimization program which includes automated exposure control, adjustment of the mA and/or kV according to patient size and/or use of iterative reconstruction technique. COMPARISON:  CT head without contrast 05/29/2022. MR head without contrast 02/07/2022 FINDINGS: Brain: No acute infarct, hemorrhage, or mass lesion is present. No significant white matter lesions are present. The ventricles are of normal size. Deep brain nuclei are within normal limits. No significant extraaxial fluid collection is present. Remote left PICA territory infarct is stable. The brainstem and cerebellum are otherwise within normal limits. Midline structures are within normal limits. Vascular: Atherosclerotic calcifications are present within cavernous internal carotid artery and at the dural margin of the right vertebral artery. No hyperdense vessels are present distally. Skull: A left parietal scalp hematoma is present. No underlying fracture is present. A left frontal scalp lipoma is present. No other focal scalp lesions are present. Sinuses/Orbits: Chronic opacification of the right sphenoid sinus is again noted.  Mild mucosal thickening is present left sphenoid sinus. Posterior left ethmoid air cells are opacified. Mild mucosal thickening is present in the inferior maxillary sinuses. The paranasal sinuses are otherwise clear. The mastoid air cells are clear. Bilateral lens replacements are noted. Globes and orbits are otherwise unremarkable. IMPRESSION: 1. Left parietal scalp hematoma without underlying fracture. 2. Stable remote left  PICA territory infarct. 3. No acute intracranial abnormality or significant interval change. Electronically Signed   By: Marin Roberts M.D.   On: 08/06/2022 13:29   DG Chest Port 1 View  Result Date: 08/06/2022 CLINICAL DATA:  Syncope EXAM: PORTABLE CHEST 1 VIEW COMPARISON:  04/10/2022 x-ray FINDINGS: Overlapping artifacts. Fixation hardware along the lower cervical spine at the edge of the imaging field. Enlarged cardiopericardial silhouette with calcified aorta. Slight prominence of the central vasculature. Bandlike opacity left lung base. Atelectasis is favored over infiltrate. There is some peribronchial thickening identified on the left. No pneumothorax or effusion. No edema. Overlapping cardiac leads. Degenerative changes of the spine IMPRESSION: Enlarged heart with calcified tortuous aorta. Slight prominence of the central vasculature. Left basilar atelectasis favored over infiltrate. Electronically Signed   By: Karen Kays M.D.   On: 08/06/2022 13:13    Pending Labs Unresulted Labs (From admission, onward)     Start     Ordered   08/07/22 0500  Basic metabolic panel  Tomorrow morning,   R        08/06/22 1440   08/07/22 0500  CBC  Tomorrow morning,   R        08/06/22 1440   08/07/22 0500  Magnesium  Tomorrow morning,   R        08/06/22 1440   08/06/22 1447  Rapid urine drug screen (hospital performed)  ONCE - STAT,   STAT        08/06/22 1446   08/06/22 1441  Hemoglobin A1c  Once,   R       Comments: To assess prior glycemic control    08/06/22 1440            Vitals/Pain Today's Vitals   08/06/22 1400 08/06/22 1415 08/06/22 1600 08/06/22 1700  BP: 132/74 135/69 131/75 (!) 150/89  Pulse: (!) 52 (!) 50 (!) 57 (!) 56  Resp: 12 16 13 10   Temp:      TempSrc:      SpO2: 100% 100% 97% 98%  Weight:      Height:      PainSc:        Isolation Precautions No active isolations  Medications Medications  acetaminophen (TYLENOL) tablet 650 mg (has no administration  in time range)    Or  acetaminophen (TYLENOL) suppository 650 mg (has no administration in time range)  ondansetron (ZOFRAN) tablet 4 mg (has no administration in time range)    Or  ondansetron (ZOFRAN) injection 4 mg (has no administration in time range)  insulin aspart (novoLOG) injection 0-15 Units ( Subcutaneous Not Given 08/06/22 1714)  insulin aspart (novoLOG) injection 0-5 Units (has no administration in time range)  0.9 %  sodium chloride infusion ( Intravenous New Bag/Given 08/06/22 1544)  rivaroxaban (XARELTO) tablet 20 mg (has no administration in time range)  acetaminophen (TYLENOL) tablet 650 mg (650 mg Oral Given 08/06/22 1436)    Mobility walks     Focused Assessments Cardiac Assessment Handoff:    Lab Results  Component Value Date   CKTOTAL 116 05/30/2021   CKMB 2.2 11/14/2009   TROPONINI <0.03 06/17/2018  Lab Results  Component Value Date   DDIMER 0.62 (H) 05/26/2020   Does the Patient currently have chest pain? No    R Recommendations: See Admitting Provider Note  Report given to:   Additional Notes: Pt is AOX4, continent to the urinal, able to answer questions, drinking juice, CBG 76, syncopal episode at church, has had a visitor at bedside

## 2022-08-06 NOTE — H&P (Signed)
History and Physical    Patient: Steve Brown ION:[id number] DOB: November 23, 1940 DOA: 08/06/2022 DOS: the patient was seen and examined on 08/06/2022 PCP: Daisy Floro, MD  Patient coming from: Home  Chief Complaint:  Chief Complaint  Patient presents with   Level 2 Fall   Loss of Consciousness   HPI: Steve Brown is a 82 y.o. male with PMH of CAD, IDDM-2 With neuropathy, PE on Xarelto, OSA, CVA, HTN, CKD-3A and BPH presenting with syncopal episode.  Patient was at the church sitting.  He felt unwell when he stood and sat down.  He asked charge members to take him to his car.  They took him out and he was sitting on the chair outside when he suddenly passed out and fell of the chair and struck his right head to the ground.  He was told his blood pressure was low but does not remember the numbers.  Per daughter, he lost consciousness for few seconds.  No seizure-like activity.  No bowel or bladder accidents.  No tongue bite.  Patient remembers feeling unwell and lightheaded before the fall.  He is on Xarelto for PE.  Reports using diuretics intermittently.  Reports taking his morning medications.  He denies fever, chills, chest pain, palpitation, shortness of breath, cough, nausea, vomiting, abdominal pain, diarrhea, dysuria, frequency, urgency, focal weakness, numbness or tingling.  Denies changes to his medication.  Denies taking any medication.  Denies over-the-counter pain medication or NSAIDs.  Patient reported that he has cane and rolling walker that he uses intermittently.  Denies smoking cigarette, drinking alcohol or recreational drug use.  Like to have cardiopulmonary resuscitation in an event of sudden cardiopulmonary arrest.  In ED, stable vitals except for bradycardia to 50s. Cr 1.8 (baseline about 1.4).  BUN 40.  Otherwise CMP without significant finding.  Hgb 11.8.  Platelet 143.  Troponin 63.  INR 2.2.  PTT 24.5.  CXR suggests cardiomegaly.  CT head and CT cervical spine  without acute finding.  EKG sinus bradycardia to 55.  Received Tylenol and hospital service called for admission for syncope.  Review of Systems: As mentioned in the history of present illness. All other systems reviewed and are negative. Past Medical History:  Diagnosis Date   Abnormal prostate biopsy    Anticoagulant long-term use    currently xarelto   Anxiety    BPH with elevated PSA    CKD (chronic kidney disease), stage II    Complication of anesthesia    limted neck rom limited use of left arm due to cva   Coronary artery disease    CARDIOLOGIST-  DR Eldridge Dace--  2010-- PCI w/ stenting midLAD   DDD (degenerative disc disease), lumbar    Degeneration of cervical intervertebral disc    Depression    Diabetes mellitus without complication (HCC)    Type 2   Dyspnea on exertion    GERD (gastroesophageal reflux disease)    Hemiparesis due to cerebral infarction    History of cerebrovascular accident (CVA) with residual deficit 2002 and 2003--  hemiparisis both sides   per MRI  anterior left frontal lobe, left para midline pons, and inferior cerebullam bilaterally infarcts   History of pulmonary embolus (PE)    06-30-2012  extensive bilaterally   History of recurrent TIAs    History of syncope    hx multiple pre-syncope and syncopal episodes due to vasovagal, orthostatic hypotension, dehydration   History of TIAs    several since 2002   Hyperlipidemia  Hypertension    Mild atherosclerosis of carotid artery, bilateral    per last duplex 11-04-2014  bilateral ICA 1--39%   Neuropathy    fingers   OSA on CPAP    uses most nights; followed by dr dohmeier--  sev. osa w/ AHI 65.9   Pneumonia    x 1   Prostate cancer (HCC) dx 2018   Renal insufficiency    S/P coronary artery stent placement 2010   stenting to mid LAD   Sensorineural hearing loss, bilateral 04/01/2021   no hearing aids   Simple renal cyst    bilaterally   Stroke [facility])    Trigger finger of both hands  11/17/2013   Type 2 diabetes mellitus (HCC) dx 1986   last one A1c 9.2 on 04-26-2016   Unsteady gait    uses straight cane and occasional walker. Hx prior CVA/TIAs;   Vertebral artery occlusion, left    chronic   Past Surgical History:  Procedure Laterality Date   ANTERIOR CERVICAL DECOMP/DISCECTOMY FUSION  2004   C3 -- C6 limited rom   ARTERY BIOPSY Right 04/21/2022   Procedure: BIOPSY TEMPORAL ARTERY;  Surgeon: Cephus Shelling, MD;  Location: [facility] OR;  Service: Vascular;  Laterality: Right;   CARDIAC CATHETERIZATION  06-10-2010   dr Eldridge Dace   wide patent LAD stent, mid lesion at the origin of the septal prior to the previous stent 40-50%/  normal LVF, ef 55%   CARDIOVASCULAR STRESS TEST  10-23-2012  dr Eldridge Dace   normal nuclear perfusion study w/ no ischemia/  normal LV function and wall motion , ef 65%   CARPAL TUNNEL RELEASE Bilateral    CATARACT EXTRACTION W/ INTRAOCULAR LENS  IMPLANT, BILATERAL     CHOLECYSTECTOMY N/A 11/02/2015   Procedure: LAPAROSCOPIC CHOLECYSTECTOMY WITH INTRAOPERATIVE CHOLANGIOGRAM;  Surgeon: Manus Rudd, MD;  Location: [facility] OR;  Service: General;  Laterality: N/A;   COLONOSCOPY     CORONARY ANGIOPLASTY WITH STENT PLACEMENT  02/2008   stenting to mid LAD   GOLD SEED IMPLANT N/A 11/15/2016   Procedure: GOLD SEED IMPLANT TIMES THREE;  Surgeon: Crist Fat, MD;  Location: [facility];  Service: Urology;  Laterality: N/A;   IR ANGIO INTRA EXTRACRAN SEL COM CAROTID INNOMINATE BILAT MOD SED  06/13/2018   IR ANGIO VERTEBRAL SEL VERTEBRAL UNI R MOD SED  06/13/2018   IR US GUIDE VASC ACCESS RIGHT  06/13/2018   LEFT HEART CATH AND CORONARY ANGIOGRAPHY N/A 05/25/2017   Procedure: LEFT HEART CATH AND CORONARY ANGIOGRAPHY;  Surgeon: Corky Crafts, MD;  Location: [facility] INVASIVE CV LAB;  Service: Cardiovascular;  Laterality: N/A;   LEFT HEART CATHETERIZATION WITH CORONARY ANGIOGRAM N/A 04/03/2013   Procedure: LEFT HEART CATHETERIZATION WITH CORONARY  ANGIOGRAM;  Surgeon: Corky Crafts, MD;  Location: [facility] CATH LAB;  Service: Cardiovascular;  Laterality: N/A;  patent mLAD stent  w/ mild disease in remainder LAD and its branches;  mod. focal lesion midLCFx- FFR of lesion was negative for ischemia/  normal LVSF, ef 50%   lungs  2005   "fluid pumped off lungs"   NEUROPLASTY / TRANSPOSITION ULNAR NERVE AT ELBOW Right 2004   PROSTATE BIOPSY N/A 08/31/2016   Procedure: PROSTATE  BIOPSY TRANSRECTAL ULTRASONIC PROSTATE (TUBP);  Surgeon: Crist Fat, MD;  Location: [facility];  Service: Urology;  Laterality: N/A;   SPACE OAR INSTILLATION N/A 11/15/2016   Procedure: SPACE OAR INSTILLATION;  Surgeon: Crist Fat, MD;  Location: [facility];  Service: Urology;  Laterality: N/A;   TRANSTHORACIC ECHOCARDIOGRAM  04/27/2016   severe focal basal LVH, ef 60-65%,  grade 2 diastoilc dysfunction/  mild AR, MR, and TR/  atrial septum lipomatous hypertrophy/  PASP   UMBILICAL HERNIA REPAIR     Social History:  reports that he has never smoked. He has never been exposed to tobacco smoke. He has never used smokeless tobacco. He reports that he does not currently use alcohol. He reports that he does not use drugs.  Allergies  Allergen Reactions   Other Other (See Comments)    Per patient- cardiac cath dye-  "woke up during procedure hysterical."   Phenergan [Promethazine] Other (See Comments)    Mood changes    Iohexol Other (See Comments)    Patient refuses contrast after having a hysterical event in hospital //r ls spoke with patient    Phenergan [Promethazine]     Other reaction(s): syncope   Tramadol     Increases Blood Sugar     Family History  Problem Relation Age of Onset   Aneurysm Mother    Cancer Father        unknown either pancreatic or prostate   Dementia Sister    Stroke Brother    Heart attack Neg Hx    Sleep apnea Neg Hx     Prior to Admission medications   Medication Sig Start  Date End Date Taking? Authorizing Provider  albuterol (VENTOLIN HFA) 108 (90 Base) MCG/ACT inhaler Inhale 1-2 puffs into the lungs every 6 (six) hours as needed for wheezing or shortness of breath. 09/06/20   Ward, Tylene Fantasia, PA-C  bisacodyl (DULCOLAX) 10 MG suppository Place 1 suppository (10 mg total) rectally daily as needed for moderate constipation. 05/31/20   Lanae Boast, MD  busPIRone (BUSPAR) 5 MG tablet Take 5 mg by mouth daily.    [provider]  chlorhexidine (PERIDEX) 0.12 % solution Use as directed 5 mLs in the mouth or throat as needed (mouth pain).    [provider]  Cholecalciferol (VITAMIN D-1000 MAX ST) 25 MCG (1000 UT) tablet Take 1,000 Units by mouth daily.    [provider]  cholestyramine light (PREVALITE) 4 g packet Take 1 packet by mouth daily as needed for constipation. 05/23/21   [provider]  clobetasol cream (TEMOVATE) 0.05 % Apply 1 Application topically daily as needed for rash. 05/31/21   [provider]  cyclobenzaprine (FLEXERIL) 10 MG tablet Take 1 tablet (10 mg total) by mouth 2 (two) times daily as needed for muscle spasms. 05/07/22   Ernie Avena, MD  diclofenac Sodium (VOLTAREN) 1 % GEL Apply 4 g topically 4 (four) times daily. Patient taking differently: Apply 4 g topically 2 (two) times daily as needed (joint pain). 09/03/19   Melene Plan, DO  escitalopram (LEXAPRO) 20 MG tablet Take 20 mg by mouth at bedtime.    [provider]  fluticasone (FLONASE) 50 MCG/ACT nasal spray Place 1 spray into both nostrils daily as needed for allergies. 06/13/20   Medina-Vargas, Monina C, NP  furosemide (LASIX) 20 MG tablet Take 1-2 tablets (20-40 mg total) by mouth daily as needed for fluid. Patient taking differently: Take 20 mg by mouth daily as needed for fluid. 06/13/20   Medina-Vargas, Monina C, NP  hydrochlorothiazide (MICROZIDE) 12.5 MG capsule Take 1 capsule (12.5 mg total) by mouth daily. 06/13/20   Medina-Vargas, Monina  C, NP  HYDROcodone-acetaminophen (NORCO/VICODIN) 5-325 MG tablet Take 1-2 tablets by  mouth every 4 (four) hours as needed. 05/07/22   Ernie Avena, MD  hydrOXYzine (VISTARIL) 25 MG capsule Take 25 mg by mouth every 8 (eight) hours as needed for anxiety.    [provider]  insulin aspart (NOVOLOG) 100 UNIT/ML FlexPen Inject 5 Units into the skin 3 (three) times daily with meals. 06/13/20   Medina-Vargas, Monina C, NP  isosorbide mononitrate (IMDUR) 30 MG 24 hr tablet Take 30 mg by mouth daily. 05/31/21   [provider]  losartan (COZAAR) 25 MG tablet Take 25 mg by mouth daily.    [provider]  meclizine (ANTIVERT) 12.5 MG tablet Take 12.5 mg by mouth 2 (two) times daily as needed for dizziness. 04/25/21   [provider]  modafinil (PROVIGIL) 100 MG tablet TAKE 1 TABLET BY MOUTH EVERY DAY 06/29/21   Dohmeier, Porfirio Mylar, MD  nitroGLYCERIN (NITROSTAT) 0.4 MG SL tablet Place 0.4 mg under the tongue every 5 (five) minutes as needed for chest pain.    [provider]  nystatin (MYCOSTATIN/NYSTOP) powder Apply 1 Application topically as needed (irritation).    [provider]  nystatin cream (MYCOSTATIN) Apply 1 Application topically as needed for rash. 06/15/21   [provider]  ondansetron (ZOFRAN) 4 MG tablet Take 4 mg by mouth every 8 (eight) hours as needed for nausea/vomiting, nausea or vomiting.    [provider]  pantoprazole (PROTONIX) 40 MG tablet Take 1 tablet (40 mg total) by mouth every morning. 06/13/20   Medina-Vargas, Monina C, NP  polyethylene glycol (MIRALAX / GLYCOLAX) 17 g packet Take 17 g by mouth daily. Patient taking differently: Take 17 g by mouth daily as needed for moderate constipation. 05/31/20   Lanae Boast, MD  pregabalin (LYRICA) 300 MG capsule Take 300 mg by mouth 2 (two) times daily. 04/26/20   [provider]  rosuvastatin (CRESTOR) 20 MG tablet TAKE 1 TABLET BY MOUTH EVERY DAY 11/02/21   Corky Crafts, MD  sitaGLIPtin (JANUVIA) 50 MG tablet Take 1 tablet (50 mg total) by mouth daily. 06/13/20   Medina-Vargas, Monina C, NP  TRESIBA FLEXTOUCH 100 UNIT/ML FlexTouch Pen Inject 34 Units into the skin daily. Patient taking differently: Inject 36 Units into the skin daily. 06/13/20   Medina-Vargas, Monina C, NP  valACYclovir (VALTREX) 500 MG tablet Take 500 mg by mouth daily as needed (cold sores). 01/25/21   [provider]  XARELTO 20 MG TABS tablet Take 1 tablet (20 mg total) by mouth daily. 06/13/20   Medina-Vargas, Avanell Shackleton C, NP    Physical Exam: Vitals:   08/06/22 1333 08/06/22 1345 08/06/22 1400 08/06/22 1415  BP: 127/71 121/68 132/74 135/69  Pulse: (!) 57 (!) 55 (!) 52 (!) 50  Resp: 10 12 12 16   Temp: 97.6 F (36.4 C)     TempSrc: Oral     SpO2: 100% 99% 100% 100%  Weight:      Height:       GENERAL: No apparent distress.  Nontoxic. HEENT: MMM.  Vision and hearing grossly intact.  NECK: Supple.  No apparent JVD.  RESP:  No IWOB.  Fair aeration bilaterally. CVS: Bradycardia to 40s.  Regular rhythm.. Heart sounds normal.  ABD/GI/GU: BS+. Abd soft, NTND.  MSK/EXT:   No apparent deformity. Moves extremities. No edema.  SKIN: no apparent skin lesion or wound NEURO: Alert wake alert and oriented x 4.  Follows commands.  Cranial nerves, motor and sensory intact. PSYCH: Calm. Normal affect.  Data Reviewed: See HPI  Assessment and Plan: Principal Problem:   Syncope Active Problems:   Diabetes mellitus type 2 with complications (HCC)   History of pulmonary embolism: June 2014,  Takes Xarelto   OSA on CPAP   Acute kidney injury (HCC)   Benign essential HTN   Diabetic neuropathy (HCC)   Obesity    Syncope and collapse: Reportedly hypotensive at scene.  He is on multiple antihypertensive meds.  He is also at risk for polypharmacy on multiple sedating medications.  EKG sinus bradycardia to 50s.  Does not seem to be on nodal blocking agents.  CT head and cervical spine  without acute finding.  He has no focal neurosymptoms.  Low suspicion for seizure.  -Continue telemetry monitoring -Hold antihypertensive meds for now -IV fluid, orthostatic vital signs echocardiogram -Fall precaution, PT/OT eval   AKI on CKD-3A: Baseline creatinine 1.3-1.4.  Reports using Lasix intermittently.  Not sure if he is using HCTZ.  Also on low-dose losartan.  Denies NSAID use. Recent Labs    10/06/21 1735 12/05/21 1519 02/06/22 1334 04/10/22 1333 04/21/22 0636 05/02/22 1234 08/06/22 1251  BUN 42* 25* 20 29* 21 21 40*  CREATININE 1.56* 1.79* 1.50* 1.47* 1.30* 1.41* 1.80*  -Avoid nephrotoxic meds. -IV NS at 75 cc an hour -Strict intake and output -Further workup if worse  Uncontrolled IDDM-2 with neuropathy and hyperglycemia: A1c 7.5% on 05/26/2020.  On Januvia, Tresiba and NovoLog at home. -CBG monitoring -SSI-moderate -Check hemoglobin A1c -Continue home statin and Lyrica as appropriate after med rec  Essential hypertension: Reportedly hypotensive before arrival.  Currently normotensive. -Hold antihypertensive meds for now  Sinus bradycardia: Not on nodal blocking agent but med rec pending. -Avoid nodal blocking agents  History of pulmonary embolism: On Xarelto.  CT head without acute finding. -Continue home Xarelto after med rec  History of CVA: No focal neurodeficit. -Continue home meds after med rec  Mood disorder: Stable -Continue home meds after med rec  OSA -CPAP at night  At risk for polypharmacy-on Atarax, Norco, Flexeril, meclizine and Lyrica which could increase the risk of fall -Consider decreasing or stopping some of those meds prior to discharge.    Advance Care Planning:   Code Status: Full Code discussed with patient and patient's daughter at bedside  Consults: None  Family Communication: Updated patient's daughter at bedside  Severity of Illness: The appropriate patient status for this patient is OBSERVATION. Observation status is  judged to be reasonable and necessary in order to provide the required intensity of service to ensure the patient's safety. The patient's presenting symptoms, physical exam findings, and initial radiographic and laboratory data in the context of their medical condition is felt to place them at decreased risk for further clinical deterioration. Furthermore, it is anticipated that the patient will be medically stable for discharge from the hospital within 2 midnights of admission.   Author: Almon Hercules, MD 08/06/2022 2:49 PM  For on call review [email or web address].

## 2022-08-06 NOTE — Progress Notes (Signed)
   08/06/22 1300  Spiritual Encounters  Type of Visit Initial  Care provided to: Patient  Referral source Trauma page  Reason for visit Trauma  OnCall Visit Yes   Ch responded to trauma page. There was no family present at bedside. Ch provided hospitality and support to staff. No follow-up needed at this time.

## 2022-08-06 NOTE — Progress Notes (Signed)
Pt declined the use of CPAP during his hospital stay. Pt informed to let RN know to contact RT if he changes his mind throughout the night.

## 2022-08-06 NOTE — ED Triage Notes (Signed)
Per EMS, Pt, from church, presents after a syncopal episode.  Pt hit posterior head during fall.  Pain score 3/10.  Pt reports taking Xarelto.  Pt was orthostatic and reported dizziness when sat up by EMS.    No bleeding noted.  Pt is A&Ox4.

## 2022-08-06 NOTE — Progress Notes (Signed)
Orthopedic Tech Progress Note Patient Details:  Steve Brown 03-15-1940 [id number]  Patient ID: Steve Brown, male   DOB: 12/23/1940, 82 y.o.   MRN: [id number] Level II; not currently needed. [name] 08/06/2022, 1:05 PM

## 2022-08-06 NOTE — ED Provider Notes (Signed)
[place] EMERGENCY DEPARTMENT AT [facility] Provider Note   CSN: [id number] Arrival date & time: 08/06/22  1243     History  Chief Complaint  Patient presents with   Level 2 Fall   Loss of Consciousness    Sahibjot Siemens is a 82 y.o. male.  HPI 82 year old male history of type 2 diabetes, history of coronary artery disease, history of PE, on Xarelto, high blood pressure, has stroke, presents today with episode of syncope and striking head.  Patient was at church standing when he felt lightheaded and had a episode of syncope.  He struck the left side of his head.  There is no report of prolonged LOC.  He was awake and alert at baseline upon EMS arrival.      Home Medications Prior to Admission medications   Medication Sig Start Date End Date Taking? Authorizing Provider  albuterol (VENTOLIN HFA) 108 (90 Base) MCG/ACT inhaler Inhale 1-2 puffs into the lungs every 6 (six) hours as needed for wheezing or shortness of breath. 09/06/20   Ward, Tylene Fantasia, PA-C  bisacodyl (DULCOLAX) 10 MG suppository Place 1 suppository (10 mg total) rectally daily as needed for moderate constipation. 05/31/20   Lanae Boast, MD  busPIRone (BUSPAR) 5 MG tablet Take 5 mg by mouth daily.    [provider]  chlorhexidine (PERIDEX) 0.12 % solution Use as directed 5 mLs in the mouth or throat as needed (mouth pain).    [provider]  Cholecalciferol (VITAMIN D-1000 MAX ST) 25 MCG (1000 UT) tablet Take 1,000 Units by mouth daily.    [provider]  cholestyramine light (PREVALITE) 4 g packet Take 1 packet by mouth daily as needed for constipation. 05/23/21   [provider]  clobetasol cream (TEMOVATE) 0.05 % Apply 1 Application topically daily as needed for rash. 05/31/21   [provider]  cyclobenzaprine (FLEXERIL) 10 MG tablet Take 1 tablet (10 mg total) by mouth 2 (two) times daily as needed for muscle spasms. 05/07/22   Ernie Avena, MD  diclofenac  Sodium (VOLTAREN) 1 % GEL Apply 4 g topically 4 (four) times daily. Patient taking differently: Apply 4 g topically 2 (two) times daily as needed (joint pain). 09/03/19   Melene Plan, DO  escitalopram (LEXAPRO) 20 MG tablet Take 20 mg by mouth at bedtime.    [provider]  fluticasone (FLONASE) 50 MCG/ACT nasal spray Place 1 spray into both nostrils daily as needed for allergies. 06/13/20   Medina-Vargas, Monina C, NP  furosemide (LASIX) 20 MG tablet Take 1-2 tablets (20-40 mg total) by mouth daily as needed for fluid. Patient taking differently: Take 20 mg by mouth daily as needed for fluid. 06/13/20   Medina-Vargas, Monina C, NP  hydrochlorothiazide (MICROZIDE) 12.5 MG capsule Take 1 capsule (12.5 mg total) by mouth daily. 06/13/20   Medina-Vargas, Monina C, NP  HYDROcodone-acetaminophen (NORCO/VICODIN) 5-325 MG tablet Take 1-2 tablets by mouth every 4 (four) hours as needed. 05/07/22   Ernie Avena, MD  hydrOXYzine (VISTARIL) 25 MG capsule Take 25 mg by mouth every 8 (eight) hours as needed for anxiety.    [provider]  insulin aspart (NOVOLOG) 100 UNIT/ML FlexPen Inject 5 Units into the skin 3 (three) times daily with meals. 06/13/20   Medina-Vargas, Monina C, NP  isosorbide mononitrate (IMDUR) 30 MG 24 hr tablet Take 30 mg by mouth daily. 05/31/21   [provider]  losartan (COZAAR) 25 MG tablet Take 25 mg by mouth daily.  [provider]  meclizine (ANTIVERT) 12.5 MG tablet Take 12.5 mg by mouth 2 (two) times daily as needed for dizziness. 04/25/21   [provider]  modafinil (PROVIGIL) 100 MG tablet TAKE 1 TABLET BY MOUTH EVERY DAY 06/29/21   Dohmeier, Porfirio Mylar, MD  nitroGLYCERIN (NITROSTAT) 0.4 MG SL tablet Place 0.4 mg under the tongue every 5 (five) minutes as needed for chest pain.    [provider]  nystatin (MYCOSTATIN/NYSTOP) powder Apply 1 Application topically as needed (irritation).    [provider]  nystatin cream  (MYCOSTATIN) Apply 1 Application topically as needed for rash. 06/15/21   [provider]  ondansetron (ZOFRAN) 4 MG tablet Take 4 mg by mouth every 8 (eight) hours as needed for nausea/vomiting, nausea or vomiting.    [provider]  pantoprazole (PROTONIX) 40 MG tablet Take 1 tablet (40 mg total) by mouth every morning. 06/13/20   Medina-Vargas, Monina C, NP  polyethylene glycol (MIRALAX / GLYCOLAX) 17 g packet Take 17 g by mouth daily. Patient taking differently: Take 17 g by mouth daily as needed for moderate constipation. 05/31/20   Lanae Boast, MD  pregabalin (LYRICA) 300 MG capsule Take 300 mg by mouth 2 (two) times daily. 04/26/20   [provider]  rosuvastatin (CRESTOR) 20 MG tablet TAKE 1 TABLET BY MOUTH EVERY DAY 11/02/21   Corky Crafts, MD  sitaGLIPtin (JANUVIA) 50 MG tablet Take 1 tablet (50 mg total) by mouth daily. 06/13/20   Medina-Vargas, Monina C, NP  TRESIBA FLEXTOUCH 100 UNIT/ML FlexTouch Pen Inject 34 Units into the skin daily. Patient taking differently: Inject 36 Units into the skin daily. 06/13/20   Medina-Vargas, Monina C, NP  valACYclovir (VALTREX) 500 MG tablet Take 500 mg by mouth daily as needed (cold sores). 01/25/21   [provider]  XARELTO 20 MG TABS tablet Take 1 tablet (20 mg total) by mouth daily. 06/13/20   Medina-Vargas, Monina C, NP      Allergies    Other, Phenergan [promethazine], Iohexol, Phenergan [promethazine], and Tramadol    Review of Systems   Review of Systems  Physical Exam Updated Vital Signs BP 135/69   Pulse (!) 50   Temp 97.6 F (36.4 C) (Oral)   Resp 16   Ht 1.651 m (5\' 5" )   Wt 83.5 kg   SpO2 100%   BMI 30.62 kg/m  Physical Exam Vitals and nursing note reviewed.  HENT:     Head: Normocephalic.     Comments: Contusion to left occiput    Nose: Nose normal.     Mouth/Throat:     Mouth: Mucous membranes are moist.  Eyes:     Pupils: Pupils are equal, round, and reactive to light.  Neck:      Comments: No external signs of trauma on neck exam Cardiovascular:     Rate and Rhythm: Normal rate.  Pulmonary:     Effort: Pulmonary effort is normal.     Breath sounds: Normal breath sounds.  Abdominal:     Palpations: Abdomen is soft.  Musculoskeletal:     Cervical back: Normal range of motion.     Comments: Pelvis stable with no tenderness Full active range of motion right lower extremity left lower exam today with no obvious external signs of trauma  Skin:    General: Skin is warm.     Capillary Refill: Capillary refill takes less than 2 seconds.  Neurological:     General: No focal deficit present.  Mental Status: He is alert.  Psychiatric:        Mood and Affect: Mood normal.     ED Results / Procedures / Treatments   Labs (all labs ordered are listed, but only abnormal results are displayed) Labs Reviewed  CBC - Abnormal; Notable for the following components:      Result Value   RBC 3.79 (*)    Hemoglobin 11.8 (*)    HCT 35.6 (*)    Platelets 143 (*)    All other components within normal limits  COMPREHENSIVE METABOLIC PANEL - Abnormal; Notable for the following components:   Glucose, Bld 141 (*)    BUN 40 (*)    Creatinine, Ser 1.80 (*)    Calcium 8.2 (*)    Total Protein 5.8 (*)    Albumin 2.9 (*)    GFR, Estimated 38 (*)    All other components within normal limits  PROTIME-INR - Abnormal; Notable for the following components:   Prothrombin Time 24.5 (*)    INR 2.2 (*)    All other components within normal limits  TROPONIN I (HIGH SENSITIVITY) - Abnormal; Notable for the following components:   Troponin I (High Sensitivity) 63 (*)    All other components within normal limits    EKG EKG Interpretation Date/Time:  Sunday August 06 2022 12:56:15 EDT Ventricular Rate:  55 PR Interval:  179 QRS Duration:  107 QT Interval:  435 QTC Calculation: 416 R Axis:   -10  Text Interpretation: Sinus rhythm Borderline low voltage, extremity leads RSR' in V1  or V2, right VCD or RVH No significant change since last tracing of 29 May 2022 Confirmed by Margarita Grizzle [phone]) on 08/06/2022 1:06:43 PM  Radiology CT Cervical Spine Wo Contrast  Result Date: 08/06/2022 CLINICAL DATA:  Neck trauma. Syncopal episode. Patient struck head. EXAM: CT CERVICAL SPINE WITHOUT CONTRAST TECHNIQUE: Multidetector CT imaging of the cervical spine was performed without intravenous contrast. Multiplanar CT image reconstructions were also generated. RADIATION DOSE REDUCTION: This exam was performed according to the departmental dose-optimization program which includes automated exposure control, adjustment of the mA and/or kV according to patient size and/or use of iterative reconstruction technique. COMPARISON:  CT of the cervical spine 09/03/2019 FINDINGS: Alignment: No significant listhesis is present. Straightening of the normal cervical lordosis is again seen. Skull base and vertebrae: Corpectomy strut grafting noted from C2 through C5. Ankylosis is present across the C5-6 disc space. Hardware is intact and stable. No acute fractures are present. Soft tissues and spinal canal: Atherosclerotic calcifications are present at the carotid bifurcations bilaterally without definite stenosis. No prevertebral fluid or swelling is present. Visible canal hematoma is present. Disc levels: Uncovertebral spurring results in moderate foraminal narrowing bilaterally at C6-7, right greater than left. Uncovertebral spurring contributes to moderate bilateral foraminal narrowing at C7-T1. Upper chest: The lung apices are clear. The thoracic inlet is within normal limits. IMPRESSION: 1. No acute fracture or traumatic subluxation. 2. Corpectomy strut grafting from C2 through C5. 3. Ankylosis across the C5-6 disc space. 4. Moderate foraminal narrowing bilaterally at C6-7 and C7-T1. Electronically Signed   By: Marin Roberts M.D.   On: 08/06/2022 13:32   CT Head Wo Contrast  Result Date:  08/06/2022 CLINICAL DATA:  Head trauma, minor. Syncopal episode. Patient hit head. Patient is on Xarelto. EXAM: CT HEAD WITHOUT CONTRAST TECHNIQUE: Contiguous axial images were obtained from the base of the skull through the vertex without intravenous contrast. RADIATION DOSE REDUCTION: This exam was performed  according to the departmental dose-optimization program which includes automated exposure control, adjustment of the mA and/or kV according to patient size and/or use of iterative reconstruction technique. COMPARISON:  CT head without contrast 05/29/2022. MR head without contrast 02/07/2022 FINDINGS: Brain: No acute infarct, hemorrhage, or mass lesion is present. No significant white matter lesions are present. The ventricles are of normal size. Deep brain nuclei are within normal limits. No significant extraaxial fluid collection is present. Remote left PICA territory infarct is stable. The brainstem and cerebellum are otherwise within normal limits. Midline structures are within normal limits. Vascular: Atherosclerotic calcifications are present within cavernous internal carotid artery and at the dural margin of the right vertebral artery. No hyperdense vessels are present distally. Skull: A left parietal scalp hematoma is present. No underlying fracture is present. A left frontal scalp lipoma is present. No other focal scalp lesions are present. Sinuses/Orbits: Chronic opacification of the right sphenoid sinus is again noted. Mild mucosal thickening is present left sphenoid sinus. Posterior left ethmoid air cells are opacified. Mild mucosal thickening is present in the inferior maxillary sinuses. The paranasal sinuses are otherwise clear. The mastoid air cells are clear. Bilateral lens replacements are noted. Globes and orbits are otherwise unremarkable. IMPRESSION: 1. Left parietal scalp hematoma without underlying fracture. 2. Stable remote left PICA territory infarct. 3. No acute intracranial abnormality  or significant interval change. Electronically Signed   By: Marin Roberts M.D.   On: 08/06/2022 13:29   DG Chest Port 1 View  Result Date: 08/06/2022 CLINICAL DATA:  Syncope EXAM: PORTABLE CHEST 1 VIEW COMPARISON:  04/10/2022 x-Sadat Sliwa FINDINGS: Overlapping artifacts. Fixation hardware along the lower cervical spine at the edge of the imaging field. Enlarged cardiopericardial silhouette with calcified aorta. Slight prominence of the central vasculature. Bandlike opacity left lung base. Atelectasis is favored over infiltrate. There is some peribronchial thickening identified on the left. No pneumothorax or effusion. No edema. Overlapping cardiac leads. Degenerative changes of the spine IMPRESSION: Enlarged heart with calcified tortuous aorta. Slight prominence of the central vasculature. Left basilar atelectasis favored over infiltrate. Electronically Signed   By: Karen Kays M.D.   On: 08/06/2022 13:13    Procedures .Critical Care  Performed by: Margarita Grizzle, MD Authorized by: Margarita Grizzle, MD   Critical care provider statement:    Critical care time (minutes):  30   Critical care was time spent personally by me on the following activities:  Development of treatment plan with patient or surrogate, discussions with consultants, evaluation of patient's response to treatment, examination of patient, ordering and review of laboratory studies, ordering and review of radiographic studies, ordering and performing treatments and interventions, pulse oximetry, re-evaluation of patient's condition and review of old charts     Medications Ordered in ED Medications  acetaminophen (TYLENOL) tablet 650 mg (has no administration in time range)    ED Course/ Medical Decision Making/ A&P Clinical Course as of 08/06/22 1418  Sun Aug 06, 2022  1350 Troponin reviewed interpreted elevated today at 63 [DR]  1351 CBC reviewed interpreted significant for mild thrombocytopenia and anemia which appears stable  from first prior [DR]  1351 Complete metabolic panel is reviewed interpreted significant for hyperglycemia, elevated BUN/creatinine at 1.8 which is somewhat increased from baseline at 1.4 [DR]  1351 INR reviewed interpreted significant for elevation at 2.2 [DR]  1351 CT of neck there is no evidence of acute fracture or subluxation [DR]  1351 CT of head consistent with left parietal scalp hematoma but no underlying  fracture remote left PICA territory infarct no evidence of acute normality noted [DR]    Clinical Course User Index [DR] Margarita Grizzle, MD                             Medical Decision Making Amount and/or Complexity of Data Reviewed Labs: ordered. Radiology: ordered.  Risk OTC drugs. Decision regarding hospitalization.   82 year old male known history of CAD presents today after syncopal episode while at church.  Patient did fall and strike his head.  He is on Coumadin. Patient is evaluated here in the ED for syncope. Differential diagnosis includes was not limited to cardiac etiology, anemia, volume depletion, orthostatic hypotension, infection metabolic abnormalities. Patient evaluated here with CBC hemoglobin stable at 11.8 Mildly increased creatinine which would favor some volume depletion. Initial troponin slightly elevated at 63 Patient is not having active chest pain. Patient was evaluated for trauma from fall with head CT and cervical spine CT.  These do not show any acute intracranial abnormality or fracture or cervical spine abnormality. Discussed with Dr. Alanda Slim  Will see for admission        Final Clinical Impression(s) / ED Diagnoses Final diagnoses:  Syncope, unspecified syncope type  Contusion of scalp, initial encounter  Chronic anticoagulation  Elevated troponin    Rx / DC Orders ED Discharge Orders     None         Margarita Grizzle, MD 08/06/22 1419

## 2022-08-06 NOTE — Plan of Care (Signed)
  Problem: Education: Goal: Knowledge of General Education information will improve Description: Including pain rating scale, medication(s)/side effects and non-pharmacologic comfort measures Outcome: Completed/Met

## 2022-08-06 NOTE — ED Notes (Signed)
Pt left the floor in stable condition, AOX4, with staff and his belongings.

## 2022-08-06 NOTE — ED Notes (Signed)
Phlebotomy asked to draw repeat Troponin level.

## 2022-08-07 ENCOUNTER — Observation Stay (HOSPITAL_COMMUNITY): Payer: Medicare Other

## 2022-08-07 DIAGNOSIS — E669 Obesity, unspecified: Secondary | ICD-10-CM | POA: Diagnosis present

## 2022-08-07 DIAGNOSIS — E11649 Type 2 diabetes mellitus with hypoglycemia without coma: Secondary | ICD-10-CM | POA: Diagnosis present

## 2022-08-07 DIAGNOSIS — R7989 Other specified abnormal findings of blood chemistry: Secondary | ICD-10-CM | POA: Diagnosis present

## 2022-08-07 DIAGNOSIS — Y9222 Religious institution as the place of occurrence of the external cause: Secondary | ICD-10-CM | POA: Diagnosis not present

## 2022-08-07 DIAGNOSIS — I129 Hypertensive chronic kidney disease with stage 1 through stage 4 chronic kidney disease, or unspecified chronic kidney disease: Secondary | ICD-10-CM | POA: Diagnosis present

## 2022-08-07 DIAGNOSIS — H903 Sensorineural hearing loss, bilateral: Secondary | ICD-10-CM | POA: Diagnosis present

## 2022-08-07 DIAGNOSIS — E1165 Type 2 diabetes mellitus with hyperglycemia: Secondary | ICD-10-CM | POA: Diagnosis present

## 2022-08-07 DIAGNOSIS — N400 Enlarged prostate without lower urinary tract symptoms: Secondary | ICD-10-CM | POA: Diagnosis present

## 2022-08-07 DIAGNOSIS — I2510 Atherosclerotic heart disease of native coronary artery without angina pectoris: Secondary | ICD-10-CM | POA: Diagnosis present

## 2022-08-07 DIAGNOSIS — W19XXXA Unspecified fall, initial encounter: Secondary | ICD-10-CM | POA: Diagnosis present

## 2022-08-07 DIAGNOSIS — Z888 Allergy status to other drugs, medicaments and biological substances status: Secondary | ICD-10-CM | POA: Diagnosis not present

## 2022-08-07 DIAGNOSIS — Z91041 Radiographic dye allergy status: Secondary | ICD-10-CM | POA: Diagnosis not present

## 2022-08-07 DIAGNOSIS — Z794 Long term (current) use of insulin: Secondary | ICD-10-CM | POA: Diagnosis not present

## 2022-08-07 DIAGNOSIS — N179 Acute kidney failure, unspecified: Secondary | ICD-10-CM | POA: Diagnosis present

## 2022-08-07 DIAGNOSIS — E1122 Type 2 diabetes mellitus with diabetic chronic kidney disease: Secondary | ICD-10-CM | POA: Diagnosis present

## 2022-08-07 DIAGNOSIS — R55 Syncope and collapse: Secondary | ICD-10-CM

## 2022-08-07 DIAGNOSIS — Z86711 Personal history of pulmonary embolism: Secondary | ICD-10-CM | POA: Diagnosis not present

## 2022-08-07 DIAGNOSIS — Z6830 Body mass index (BMI) 30.0-30.9, adult: Secondary | ICD-10-CM | POA: Diagnosis not present

## 2022-08-07 DIAGNOSIS — F39 Unspecified mood [affective] disorder: Secondary | ICD-10-CM | POA: Diagnosis present

## 2022-08-07 DIAGNOSIS — G4733 Obstructive sleep apnea (adult) (pediatric): Secondary | ICD-10-CM | POA: Diagnosis present

## 2022-08-07 DIAGNOSIS — R001 Bradycardia, unspecified: Secondary | ICD-10-CM | POA: Diagnosis present

## 2022-08-07 DIAGNOSIS — N1831 Chronic kidney disease, stage 3a: Secondary | ICD-10-CM | POA: Diagnosis present

## 2022-08-07 DIAGNOSIS — E785 Hyperlipidemia, unspecified: Secondary | ICD-10-CM | POA: Diagnosis present

## 2022-08-07 DIAGNOSIS — Z8673 Personal history of transient ischemic attack (TIA), and cerebral infarction without residual deficits: Secondary | ICD-10-CM | POA: Diagnosis not present

## 2022-08-07 DIAGNOSIS — S0003XA Contusion of scalp, initial encounter: Secondary | ICD-10-CM | POA: Diagnosis present

## 2022-08-07 DIAGNOSIS — E1140 Type 2 diabetes mellitus with diabetic neuropathy, unspecified: Secondary | ICD-10-CM | POA: Diagnosis present

## 2022-08-07 LAB — GLUCOSE, CAPILLARY
Glucose-Capillary: 116 mg/dL — ABNORMAL HIGH (ref 70–99)
Glucose-Capillary: 68 mg/dL — ABNORMAL LOW (ref 70–99)
Glucose-Capillary: 230 mg/dL — ABNORMAL HIGH (ref 70–99)
Glucose-Capillary: 289 mg/dL — ABNORMAL HIGH (ref 70–99)
Glucose-Capillary: 285 mg/dL — ABNORMAL HIGH (ref 70–99)
Glucose-Capillary: 276 mg/dL — ABNORMAL HIGH (ref 70–99)
Glucose-Capillary: 340 mg/dL — ABNORMAL HIGH (ref 70–99)

## 2022-08-07 MED ORDER — SODIUM CHLORIDE 0.9 % IV SOLN: INTRAVENOUS | Status: DC

## 2022-08-07 MED ORDER — OXYCODONE HCL 5 MG PO TABS
5.0000 mg | ORAL_TABLET | Freq: Once | ORAL | Status: AC
  Administered 2022-08-07: 5 mg via ORAL
  Filled 2022-08-07: qty 1

## 2022-08-07 MED ORDER — BUSPIRONE HCL 5 MG PO TABS
5.0000 mg | ORAL_TABLET | Freq: Every day | ORAL | Status: DC
  Administered 2022-08-07 – 2022-08-08 (×2): 5 mg via ORAL
  Filled 2022-08-07 (×2): qty 1

## 2022-08-07 MED ORDER — ROSUVASTATIN CALCIUM 20 MG PO TABS
20.0000 mg | ORAL_TABLET | Freq: Every day | ORAL | Status: DC
  Administered 2022-08-07 – 2022-08-08 (×2): 20 mg via ORAL
  Filled 2022-08-07 (×2): qty 1

## 2022-08-07 MED ORDER — DICLOFENAC SODIUM 1 % EX GEL
4.0000 g | Freq: Four times a day (QID) | CUTANEOUS | Status: DC
  Administered 2022-08-07 – 2022-08-08 (×3): 4 g via TOPICAL
  Filled 2022-08-07: qty 100

## 2022-08-07 MED ORDER — PREGABALIN 100 MG PO CAPS
300.0000 mg | ORAL_CAPSULE | Freq: Two times a day (BID) | ORAL | Status: DC
  Administered 2022-08-07 – 2022-08-08 (×3): 300 mg via ORAL
  Filled 2022-08-07 (×3): qty 3

## 2022-08-07 MED ORDER — ESCITALOPRAM OXALATE 20 MG PO TABS
20.0000 mg | ORAL_TABLET | Freq: Every day | ORAL | Status: DC
  Administered 2022-08-07: 20 mg via ORAL
  Filled 2022-08-07: qty 1

## 2022-08-07 MED ORDER — INSULIN GLARGINE-YFGN 100 UNIT/ML ~~LOC~~ SOLN
5.0000 [IU] | SUBCUTANEOUS | Status: DC
  Administered 2022-08-07: 5 [IU] via SUBCUTANEOUS
  Filled 2022-08-07 (×2): qty 0.05

## 2022-08-07 MED ORDER — NITROGLYCERIN 0.4 MG SL SUBL: 0.4000 mg | SUBLINGUAL_TABLET | SUBLINGUAL | Status: DC | PRN

## 2022-08-07 MED ORDER — VITAMIN D 25 MCG (1000 UNIT) PO TABS
1000.0000 [IU] | ORAL_TABLET | Freq: Every day | ORAL | Status: DC
  Administered 2022-08-07 – 2022-08-08 (×2): 1000 [IU] via ORAL
  Filled 2022-08-07 (×2): qty 1

## 2022-08-07 MED ORDER — POLYETHYLENE GLYCOL 3350 17 G PO PACK
17.0000 g | PACK | Freq: Every day | ORAL | Status: DC | PRN
  Administered 2022-08-07: 17 g via ORAL
  Filled 2022-08-07: qty 1

## 2022-08-07 MED ORDER — SENNOSIDES-DOCUSATE SODIUM 8.6-50 MG PO TABS
1.0000 | ORAL_TABLET | Freq: Two times a day (BID) | ORAL | Status: DC
  Administered 2022-08-07 – 2022-08-08 (×3): 1 via ORAL
  Filled 2022-08-07 (×3): qty 1

## 2022-08-07 MED ORDER — RIVAROXABAN 20 MG PO TABS: 20.0000 mg | ORAL_TABLET | Freq: Every day | ORAL | Status: DC

## 2022-08-07 NOTE — Hospital Course (Addendum)
82 yyom w/ history of CAD, IDDM-2 With neuropathy, PE on Xarelto, OSA, CVA, HTN, CKD-3A and BPH presented with syncopal episode. He was at [employer]. he felt unwell when he stood so sat down.He asked charge members to take him to his car.  They took him out and he was sitting on the chair outside when he suddenly passed out and fell of the chair and struck his right head to the ground. He was told his blood pressure was low but does not remember the numbers.  Per daughter, he lost consciousness for few seconds.  No seizure-like activity.  No bowel or bladder accidents. No tongue bite.  Patient remembers feeling unwell and lightheaded before the fall.He is on Xarelto for PE.  Reports using diuretics intermittently.  Reports taking his morning medications.  No other significant complaint/GI symptoms, urinary symptoms focal weakness  At baseline uses cane and rolling walker intermittently. In the ED: vitals stable except for bradycardia to 50s. Cr 1.8 (baseline about 1.4).  BUN 40.  Otherwise CMP without significant finding.  Hgb 11.8.  Platelet 143.  Troponin 63.  INR 2.2.  PTT 24.5.  CXR suggests cardiomegaly. CT head and CT cervical spine>no acute finding.EKG sinus bradycardia to 55. Received Tylenol and hospital service called for admission for syncope. Patient's orthostatic vitals were negative, was given IV fluids overnight with his creatinine improved and back to baseline, also noticed to be hypoglycemic diabetic medication has been adjusted.  PT OT has seen the patient and cleared, once echo results are normal he will be discharged home.  His blood sugar remains stable and trending up in high 200

## 2022-08-07 NOTE — Care Management Obs Status (Signed)
MEDICARE OBSERVATION STATUS NOTIFICATION   Patient Details  Name: Steve Brown MRN: [id number] Date of Birth: 09/12/1940   Medicare Observation Status Notification Given:  Yes    Ronny Bacon, RN 08/07/2022, 10:57 AM

## 2022-08-07 NOTE — Progress Notes (Signed)
  Echocardiogram 2D Echocardiogram has been performed.  Lucendia Herrlich 08/07/2022, 2:28 PM

## 2022-08-07 NOTE — Evaluation (Signed)
Occupational Therapy Evaluation Patient Details Name: Steve Brown MRN: [id number] DOB: January 08, 1941 Today's Date: 08/07/2022   History of Present Illness 82 y.o. male presents to [facility] hospital on 08/06/2022 after syncopal episode at church. PMH includes CAD, DMII, neuropathy, PE, OSA, CVA, HTN, CKDIII.   Clinical Impression   Patient reports being Independent in ADLs and IADLs and using cane/RW with Mod I intermittently.  Patient currently requires Supervision/min guard with RW for overall functional mobility and toilet transfer.  Patient completed LB ADLs with Supervision.Patient would benefit from additional OT intervention to address activity tolerance, strength and overall safety during ADLs and IADLs. Patient would benefit from occasional A for IADLs such as groceries and laundry when he initially returns home.     Recommendations for follow up therapy are one component of a multi-disciplinary discharge planning process, led by the attending physician.  Recommendations may be updated based on patient status, additional functional criteria and insurance authorization.   Assistance Recommended at Discharge Intermittent Supervision/Assistance  Patient can return home with the following Assist for transportation;Assistance with cooking/housework    Functional Status Assessment  Patient has had a recent decline in their functional status and demonstrates the ability to make significant improvements in function in a reasonable and predictable amount of time.  Equipment Recommendations  None recommended by OT    Recommendations for Other Services       Precautions / Restrictions Precautions Precautions: Fall Restrictions Weight Bearing Restrictions: No      Mobility Bed Mobility Overal bed mobility: Independent                  Transfers Overall transfer level: Modified independent Equipment used: Rolling walker (2 wheels) Transfers: Sit to/from Stand Sit to Stand:  Supervision                  Balance Overall balance assessment: Needs assistance Sitting-balance support: No upper extremity supported, Feet supported                                       ADL either performed or assessed with clinical judgement   ADL Overall ADL's : Modified independent                                             Vision Baseline Vision/History: 1 Wears glasses (for reading) Patient Visual Report: No change from baseline       Perception     Praxis      Pertinent Vitals/Pain Pain Assessment Pain Assessment: 0-10 Pain Score: 6  Pain Location: head  pain, L UE Pain Descriptors / Indicators: Aching Pain Intervention(s): Monitored during session     Hand Dominance Right   Extremity/Trunk Assessment Upper Extremity Assessment Upper Extremity Assessment: Overall WFL for tasks assessed   Lower Extremity Assessment Lower Extremity Assessment: Generalized weakness   Cervical / Trunk Assessment Cervical / Trunk Assessment: Normal   Communication Communication Communication: No difficulties   Cognition Arousal/Alertness: Awake/alert Behavior During Therapy: WFL for tasks assessed/performed Overall Cognitive Status: No family/caregiver present to determine baseline cognitive functioning  General Comments  VSS on RA, pt denies lightheadedness or dizziness    Exercises     Shoulder Instructions      Home Living Family/patient expects to be discharged to:: Private residence Living Arrangements: Alone Available Help at Discharge: Family;Available PRN/intermittently Type of Home: Apartment Home Access: Stairs to enter Entrance Stairs-Number of Steps: 16 Entrance Stairs-Rails: Right Home Layout: One level     Bathroom Shower/Tub: Chief Strategy Officer: Standard     Home Equipment: Agricultural consultant (2 wheels);Cane - single point           Prior Functioning/Environment Prior Level of Function : Independent/Modified Independent;Driving             Mobility Comments: active in his church, intermittent DME use when sciatic pain flares up          OT Problem List: Decreased activity tolerance;Decreased safety awareness      OT Treatment/Interventions: Self-care/ADL training;Therapeutic exercise;Therapeutic activities;Patient/family education    OT Goals(Current goals can be found in the care plan section) Acute Rehab OT Goals OT Goal Formulation: With patient Time For Goal Achievement: 08/21/22 Potential to Achieve Goals: Good  OT Frequency: Min 2X/week    Co-evaluation              AM-PAC OT "6 Clicks" Daily Activity     Outcome Measure Help from another person eating meals?: None Help from another person taking care of personal grooming?: A Little Help from another person toileting, which includes using toliet, bedpan, or urinal?: A Little Help from another person bathing (including washing, rinsing, drying)?: A Little Help from another person to put on and taking off regular upper body clothing?: None Help from another person to put on and taking off regular lower body clothing?: A Little 6 Click Score: 20   End of Session Equipment Utilized During Treatment: Rolling walker (2 wheels) Nurse Communication: Mobility status  Activity Tolerance: Patient tolerated treatment well Patient left: in chair;with nursing/sitter in room;with call bell/phone within reach  OT Visit Diagnosis: Muscle weakness (generalized) (M62.81);History of falling (Z91.81)                Time: 5784-6962 OT Time Calculation (min): 24 min Charges:  OT General Charges $OT Visit: 1 Visit OT Evaluation $OT Eval Low Complexity: 1 Low OT Treatments $Self Care/Home Management : 8-22 mins  Governor Specking OT/L Denice Paradise 08/07/2022, 1:27 PM

## 2022-08-07 NOTE — Progress Notes (Signed)
PROGRESS NOTE Steve Brown  CWC:[id number] DOB: 1940-11-16 DOA: 08/06/2022 PCP: Daisy Floro, MD  Brief Narrative/Hospital Course: 78 yyom w/ history of CAD, IDDM-2 With neuropathy, PE on Xarelto, OSA, CVA, HTN, CKD-3A and BPH presented with syncopal episode. He was at [employer]. he felt unwell when he stood so sat down.He asked charge members to take him to his car.  They took him out and he was sitting on the chair outside when he suddenly passed out and fell of the chair and struck his right head to the ground. He was told his blood pressure was low but does not remember the numbers.  Per daughter, he lost consciousness for few seconds.  No seizure-like activity.  No bowel or bladder accidents. No tongue bite.  Patient remembers feeling unwell and lightheaded before the fall.He is on Xarelto for PE.  Reports using diuretics intermittently.  Reports taking his morning medications.  No other significant complaint/GI symptoms, urinary symptoms focal weakness  At baseline uses cane and rolling walker intermittently. In the ED: vitals stable except for bradycardia to 50s. Cr 1.8 (baseline about 1.4).  BUN 40.  Otherwise CMP without significant finding.  Hgb 11.8.  Platelet 143.  Troponin 63.  INR 2.2.  PTT 24.5.  CXR suggests cardiomegaly. CT head and CT cervical spine>no acute finding.EKG sinus bradycardia to 55. Received Tylenol and hospital service called for admission for syncope. Patient's orthostatic vitals were negative, was given IV fluids overnight with his creatinine improved and back to baseline, also noticed to be hypoglycemic diabetic medication has been adjusted.  PT OT has seen the patient and cleared, once echo results are normal he will be discharged home.  His blood sugar remains stable and trending up in high 200     Subjective: Patient seen and examined this morning Overnight afebrile, BP stable, lab with episode of hypoglycemia 68 at 7:48 AM He was feeling woozy although  orthostatic vitals negative   Assessment and Plan: Principal Problem:   Syncope Active Problems:   Diabetes mellitus type 2 with complications (HCC)   History of pulmonary embolism: June 2014,  Takes Xarelto   OSA on CPAP   Acute kidney injury (HCC)   Benign essential HTN   Diabetic neuropathy (HCC)   Obesity  Syncope: reportedly hypotensive on the scene, in the setting of multiple antihypertensive medication, at risk of polypharmacy.  EKG with mild sinus bradycardia, CT head C-spine no acute finding endocrinological symptoms on admission low suspicion for seizure.  Monitoring vitals holding antihypertensives, check orthostatic vitals, follow-up echo, PT OT and fall precaution.  He is feeling dizzy although orthostatic vitals negative will keep on IV fluid hydration overnight add compression stocking  AKI on CKD 3A  baseline creatinine 1.3-1.4 unsure if using HCTZ, hold Lasix, hold low-dose losartan given IVF overnigh-creatinine improved to baseline.  Encourage oral hydration  IDDM with neuropathy hyperglycemia, episode of hypoglycemia overnight, last A1c 7.5 on Januvia Tresiba 36 u and NovoLog tid at home.  A1c pending will hold his diabetic regimen.  Essential hypertension: Reportedly hypotensive before arrival.  Currently normotensive. Cont to hold antihypertensive meds for now   Sinus bradycardia: Not on nodal blocking agent but med rec pending.Avoid nodal blocking agents   History of pulmonary embolism: On Xarelto.  CT head without acute finding.Continue home Xarelto    History of CVA: No focal neurodeficit. Continue home meds after med rec  Mood disorder: Mood stable continue home meds   OSA Cont CPAP at night if he allows  At risk for polypharmacy-on Atarax, Norco, Flexeril, meclizine and Lyrica which could increase the risk of fall> he will need to follow-up with PCP will follow-up from Flexeril, meclizine and Atarax   Class I Obesity:Patient's Body mass index is  30.62 kg/m. : Will benefit with PCP follow-up, weight loss  healthy lifestyle and outpatient sleep evaluation.   DVT prophylaxis:  Code Status:   Code Status: Full Code Family Communication: plan of care discussed with patient at bedside. Patient status is:  admitted as observation but remains hospitalized for ongoing syncope management Level of care: Telemetry Medical   Dispo: The patient is from: home            Anticipated disposition: home tomorrow if stable Objective: Vitals last 24 hrs: Vitals:   08/06/22 2122 08/07/22 0434 08/07/22 0833 08/07/22 1345  BP: 134/78 (!) 149/86 136/71   Pulse: 64 (!) 55 62 63  Resp: 18 18 18    Temp: 97.9 F (36.6 C) 97.8 F (36.6 C) 97.7 F (36.5 C) 98.2 F (36.8 C)  TempSrc:   Oral Oral  SpO2: 95% 98% 100% 100%  Weight:      Height:       Weight change:   Physical Examination: General exam: alert awake, older than stated age HEENT:Oral mucosa moist, Ear/Nose WNL grossly Respiratory system: bilaterally clear BS, no use of accessory muscle Cardiovascular system: S1 & S2 +, No JVD. Gastrointestinal system: Abdomen soft,NT,ND, BS+ Nervous System:Alert, awake, moving extremities. Extremities: LE edema neg,distal peripheral pulses palpable.  Skin: No rashes,no icterus. MSK: Normal muscle bulk,tone, power  Medications reviewed:  Scheduled Meds:  busPIRone  5 mg Oral Daily   cholecalciferol  1,000 Units Oral Daily   diclofenac Sodium  4 g Topical QID   escitalopram  20 mg Oral QHS   pregabalin  300 mg Oral BID   rivaroxaban  20 mg Oral Q supper   rosuvastatin  20 mg Oral Daily   senna-docusate  1 tablet Oral BID  Continuous Infusions:  sodium chloride 75 mL/hr at 08/07/22 0447    Diet Order             Diet heart healthy/carb modified Room service appropriate? Yes; Fluid consistency: Thin  Diet effective now                   Intake/Output Summary (Last 24 hours) at 08/07/2022 1359 Last data filed at 08/07/2022 1354 Gross  per 24 hour  Intake 1898.2 ml  Output 1950 ml  Net -51.8 ml   Net IO Since Admission: -51.8 mL [08/07/22 1359]  Wt Readings from Last 3 Encounters:  08/06/22 83.5 kg  06/28/22 83.6 kg  05/29/22 83.4 kg     Unresulted Labs (From admission, onward)     Start     Ordered   08/06/22 1441  Hemoglobin A1c  Once,   R       Comments: To assess prior glycemic control    08/06/22 1440          Data Reviewed: I have personally reviewed following labs and imaging studies CBC: Recent Labs  Lab 08/06/22 1251 08/07/22 0236  WBC 4.4 4.7  HGB 11.8* 12.0*  HCT 35.6* 35.5*  MCV 93.9 91.7  PLT 143* 145*   Basic Metabolic Panel: Recent Labs  Lab 08/06/22 1251 08/07/22 0236  NA 138 138  K 4.1 3.8  CL 104 106  CO2 26 27  GLUCOSE 141* 101*  BUN 40* 31*  CREATININE 1.80* 1.38*  CALCIUM 8.2* 8.1*  MG  --  1.8  GFR: Estimated Creatinine Clearance: 42.5 mL/min (A) (by C-G formula based on SCr of 1.38 mg/dL (H)). Liver Function Tests: Recent Labs  Lab 08/06/22 1251  AST 17  ALT 12  ALKPHOS 79  BILITOT 0.5  PROT 5.8*  ALBUMIN 2.9*  Coagulation Profile: Recent Labs  Lab 08/06/22 1251  INR 2.2*  No results found for this or any previous visit (from the past 240 hour(s)).  Antimicrobials: Anti-infectives (From admission, onward)    None      Culture/Microbiology    Component Value Date/Time   SDES  02/07/2022 0238    URINE, CLEAN CATCH Performed at [facility], [address], [place], [place] [address]    SPECREQUEST  02/07/2022 4098    NONE Performed at [facility], [address] [place], [place] [address]    CULT (A) 02/07/2022 0238    >=100,000 COLONIES/mL ESCHERICHIA COLI Confirmed Extended Spectrum Beta-Lactamase Producer (ESBL).  In bloodstream infections from ESBL organisms, carbapenems are preferred over piperacillin/tazobactam. They are shown to have a lower risk of mortality.    REPTSTATUS 02/09/2022 FINAL  02/07/2022 0238   Radiology Studies: CT Cervical Spine Wo Contrast  Result Date: 08/06/2022 CLINICAL DATA:  Neck trauma. Syncopal episode. Patient struck head. EXAM: CT CERVICAL SPINE WITHOUT CONTRAST TECHNIQUE: Multidetector CT imaging of the cervical spine was performed without intravenous contrast. Multiplanar CT image reconstructions were also generated. RADIATION DOSE REDUCTION: This exam was performed according to the departmental dose-optimization program which includes automated exposure control, adjustment of the mA and/or kV according to patient size and/or use of iterative reconstruction technique. COMPARISON:  CT of the cervical spine 09/03/2019 FINDINGS: Alignment: No significant listhesis is present. Straightening of the normal cervical lordosis is again seen. Skull base and vertebrae: Corpectomy strut grafting noted from C2 through C5. Ankylosis is present across the C5-6 disc space. Hardware is intact and stable. No acute fractures are present. Soft tissues and spinal canal: Atherosclerotic calcifications are present at the carotid bifurcations bilaterally without definite stenosis. No prevertebral fluid or swelling is present. Visible canal hematoma is present. Disc levels: Uncovertebral spurring results in moderate foraminal narrowing bilaterally at C6-7, right greater than left. Uncovertebral spurring contributes to moderate bilateral foraminal narrowing at C7-T1. Upper chest: The lung apices are clear. The thoracic inlet is within normal limits. IMPRESSION: 1. No acute fracture or traumatic subluxation. 2. Corpectomy strut grafting from C2 through C5. 3. Ankylosis across the C5-6 disc space. 4. Moderate foraminal narrowing bilaterally at C6-7 and C7-T1. Electronically Signed   By: Marin Roberts M.D.   On: 08/06/2022 13:32   CT Head Wo Contrast  Result Date: 08/06/2022 CLINICAL DATA:  Head trauma, minor. Syncopal episode. Patient hit head. Patient is on Xarelto. EXAM: CT HEAD  WITHOUT CONTRAST TECHNIQUE: Contiguous axial images were obtained from the base of the skull through the vertex without intravenous contrast. RADIATION DOSE REDUCTION: This exam was performed according to the departmental dose-optimization program which includes automated exposure control, adjustment of the mA and/or kV according to patient size and/or use of iterative reconstruction technique. COMPARISON:  CT head without contrast 05/29/2022. MR head without contrast 02/07/2022 FINDINGS: Brain: No acute infarct, hemorrhage, or mass lesion is present. No significant white matter lesions are present. The ventricles are of normal size. Deep brain nuclei are within normal limits. No significant extraaxial fluid collection is present. Remote left PICA territory infarct is stable. The brainstem and cerebellum are  otherwise within normal limits. Midline structures are within normal limits. Vascular: Atherosclerotic calcifications are present within cavernous internal carotid artery and at the dural margin of the right vertebral artery. No hyperdense vessels are present distally. Skull: A left parietal scalp hematoma is present. No underlying fracture is present. A left frontal scalp lipoma is present. No other focal scalp lesions are present. Sinuses/Orbits: Chronic opacification of the right sphenoid sinus is again noted. Mild mucosal thickening is present left sphenoid sinus. Posterior left ethmoid air cells are opacified. Mild mucosal thickening is present in the inferior maxillary sinuses. The paranasal sinuses are otherwise clear. The mastoid air cells are clear. Bilateral lens replacements are noted. Globes and orbits are otherwise unremarkable. IMPRESSION: 1. Left parietal scalp hematoma without underlying fracture. 2. Stable remote left PICA territory infarct. 3. No acute intracranial abnormality or significant interval change. Electronically Signed   By: Marin Roberts M.D.   On: 08/06/2022 13:29   DG  Chest Port 1 View  Result Date: 08/06/2022 CLINICAL DATA:  Syncope EXAM: PORTABLE CHEST 1 VIEW COMPARISON:  04/10/2022 x-ray FINDINGS: Overlapping artifacts. Fixation hardware along the lower cervical spine at the edge of the imaging field. Enlarged cardiopericardial silhouette with calcified aorta. Slight prominence of the central vasculature. Bandlike opacity left lung base. Atelectasis is favored over infiltrate. There is some peribronchial thickening identified on the left. No pneumothorax or effusion. No edema. Overlapping cardiac leads. Degenerative changes of the spine IMPRESSION: Enlarged heart with calcified tortuous aorta. Slight prominence of the central vasculature. Left basilar atelectasis favored over infiltrate. Electronically Signed   By: Karen Kays M.D.   On: 08/06/2022 13:13     LOS: 0 days   Lanae Boast, MD [facility]  08/07/2022, 1:59 PM

## 2022-08-07 NOTE — Evaluation (Signed)
Physical Therapy Evaluation Patient Details Name: Steve Brown MRN: [id number] DOB: Sep 25, 1940 Today's Date: 08/07/2022  History of Present Illness  82 y.o. male presents to [facility] hospital on 08/06/2022 after syncopal episode at church. PMH includes CAD, DMII, neuropathy, PE, OSA, CVA, HTN, CKDIII.  Clinical Impression  Pt presents to PT with deficits in power, balance, endurance, gait. Pt ambulates with use of SPC vs RW PRN at baseline when sciatic nerve pain flares. Pt does not report significant pain at this time but elects to utilize RW for stability. Pt is able to ambulate for household distances without physical assistance, denies any pre-syncopal symptoms during session. PT recommends continued outpatient PT at the time of discharge.        If plan is discharge home, recommend the following: Assistance with cooking/housework;Direct supervision/assist for medications management;Direct supervision/assist for financial management;Help with stairs or ramp for entrance   Can travel by private vehicle        Equipment Recommendations None recommended by PT  Recommendations for Other Services       Functional Status Assessment Patient has had a recent decline in their functional status and demonstrates the ability to make significant improvements in function in a reasonable and predictable amount of time.     Precautions / Restrictions Precautions Precautions: Fall Restrictions Weight Bearing Restrictions: No      Mobility  Bed Mobility                    Transfers Overall transfer level: Needs assistance Equipment used: None Transfers: Sit to/from Stand Sit to Stand: Supervision                Ambulation/Gait Ambulation/Gait assistance: Supervision Gait Distance (Feet): 200 Feet Assistive device: Rolling walker (2 wheels) Gait Pattern/deviations: Step-through pattern Gait velocity: functional Gait velocity interpretation: 1.31 - 2.62 ft/sec, indicative  of limited community ambulator   General Gait Details: slowed step-through gait  Stairs            Wheelchair Mobility     Tilt Bed    Modified Rankin (Stroke Patients Only)       Balance Overall balance assessment: Needs assistance Sitting-balance support: No upper extremity supported, Feet supported Sitting balance-Leahy Scale: Good     Standing balance support: No upper extremity supported, During functional activity Standing balance-Leahy Scale: Fair                               Pertinent Vitals/Pain Pain Assessment Pain Assessment: No/denies pain    Home Living Family/patient expects to be discharged to:: Private residence Living Arrangements: Alone Available Help at Discharge: Family;Available PRN/intermittently Type of Home: Apartment Home Access: Stairs to enter Entrance Stairs-Rails: Right Entrance Stairs-Number of Steps: 16   Home Layout: One level Home Equipment: Agricultural consultant (2 wheels);Cane - single point      Prior Function Prior Level of Function : Independent/Modified Independent;Driving             Mobility Comments: active in his church, intermittent DME use when sciatic pain flares up       Hand Dominance        Extremity/Trunk Assessment   Upper Extremity Assessment Upper Extremity Assessment: Generalized weakness    Lower Extremity Assessment Lower Extremity Assessment: Generalized weakness    Cervical / Trunk Assessment Cervical / Trunk Assessment: Normal  Communication   Communication: No difficulties  Cognition Arousal/Alertness: Awake/alert Behavior During Therapy: WFL for  tasks assessed/performed Overall Cognitive Status: Impaired/Different from baseline                                 General Comments: pt discussing his hobbies and reports going to church, watching westerns, and [employer]?        General Comments General comments (skin integrity, edema, etc.): VSS on RA, pt  denies lightheadedness or dizziness    Exercises     Assessment/Plan    PT Assessment Patient needs continued PT services  PT Problem List Decreased strength;Decreased activity tolerance;Decreased balance;Decreased mobility;Decreased knowledge of use of DME;Decreased cognition       PT Treatment Interventions DME instruction;Gait training;Stair training;Functional mobility training;Therapeutic activities;Balance training;Patient/family education    PT Goals (Current goals can be found in the Care Plan section)  Acute Rehab PT Goals Patient Stated Goal: to go home PT Goal Formulation: With patient Time For Goal Achievement: 08/21/22 Potential to Achieve Goals: Good Additional Goals Additional Goal #1: Pt will score >19/24 on the DGI to indicate a reduced risk for falls    Frequency Min 1X/week     Co-evaluation               AM-PAC PT "6 Clicks" Mobility  Outcome Measure Help needed turning from your back to your side while in a flat bed without using bedrails?: A Little Help needed moving from lying on your back to sitting on the side of a flat bed without using bedrails?: A Little Help needed moving to and from a bed to a chair (including a wheelchair)?: A Little Help needed standing up from a chair using your arms (e.g., wheelchair or bedside chair)?: A Little Help needed to walk in hospital room?: A Little Help needed climbing 3-5 steps with a railing? : A Little 6 Click Score: 18    End of Session   Activity Tolerance: Patient tolerated treatment well Patient left: in chair;with call bell/phone within reach Nurse Communication: Mobility status PT Visit Diagnosis: Other abnormalities of gait and mobility (R26.89);Muscle weakness (generalized) (M62.81)    Time: 5188-4166 PT Time Calculation (min) (ACUTE ONLY): 17 min   Charges:   PT Evaluation $PT Eval Low Complexity: 1 Low   PT General Charges $$ ACUTE PT VISIT: 1 Visit         Arlyss Gandy, PT,  DPT Acute Rehabilitation Office [phone]   Arlyss Gandy 08/07/2022, 11:23 AM

## 2022-08-07 NOTE — TOC Initial Note (Signed)
Transition of Care [facility]) - Initial/Assessment Note    Patient Details  Name: Steve Brown MRN: [id number] Date of Birth: 06/28/1940  Transition of Care [facility]) CM/SW Contact:    Ronny Bacon, RN Phone Number: 08/07/2022, 1:31 PM  Clinical Narrative:   Spoke with patient at bedside. Patient lives in an apartment alone. There are 16 steps to get to his 1 level apartment. Patient daughter will transport him home at discharge. Patient currently active with [facility] physical therapy 2 times a week and goes to the [facility] 2 times a week. Patient has RW, shower chair and cane at home. Patient confirms he has medicare and supplemental insurance, PCP and access to appointments and pharmacy.                Expected Discharge Plan: Home/Self Care Barriers to Discharge: Continued Medical Work up   Patient Goals and CMS Choice Patient states their goals for this hospitalization and ongoing recovery are:: to go home          Expected Discharge Plan and Services       Living arrangements for the past 2 months: Apartment                                      Prior Living Arrangements/Services Living arrangements for the past 2 months: Apartment Lives with:: Self Patient language and need for interpreter reviewed:: Yes Do you feel safe going back to the place where you live?: Yes      Need for Family Participation in Patient Care: No (Comment) Care giver support system in place?: Yes (comment) Current home services: DME (Shower chair, cane, RW. Outpatient PT [facility] Physical therapy 2 times a week and [facility] 2 times a week) Criminal Activity/Legal Involvement Pertinent to Current Situation/Hospitalization: No - Comment as needed  Activities of Daily Living      Permission Sought/Granted Permission sought to share information with : Case Manager                Emotional Assessment Appearance:: Appears stated age Attitude/Demeanor/Rapport: Engaged Affect  (typically observed): Appropriate Orientation: : Oriented to Self, Oriented to Place, Oriented to  Time, Oriented to Situation Alcohol / Substance Use: Not Applicable Psych Involvement: No (comment)  Admission diagnosis:  Syncope [R55] Chronic anticoagulation [Z79.01] Elevated troponin [R79.89] Contusion of scalp, initial encounter [S00.03XA] Syncope, unspecified syncope type [R55] Patient Active Problem List   Diagnosis Date Noted   Jaw pain, non-TMJ 03/20/2022   Temporal pain 03/20/2022   Jaw claudication 03/20/2022   Dizziness on standing 05/18/2021   Vision changes 05/18/2021   Chronic ischemic vertebrobasilar artery cerebellar stroke 05/18/2021   Amaurosis fugax, both eyes 01/18/2021   Transient diplopia 01/18/2021   Hereditary and idiopathic neuropathy, unspecified 11/20/2020   Hyperglycemia due to type 2 diabetes mellitus (HCC) 11/20/2020   Obesity 11/20/2020   Pure hypercholesterolemia 11/20/2020   Syncope 05/26/2020   Back pain 05/26/2020   Acute metabolic encephalopathy 05/26/2020   Lumbar spinal stenosis 08/15/2019   Nonspecific chest pain 08/11/2019   Shortness of breath    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 (HCC) 10/21/2018   Nocturnal hypoxemia 10/21/2018   Radiation therapy complication 07/31/2017   Primary prostate cancer (HCC) 07/31/2017   Anemia 07/31/2017   Vertigo due to cerebrovascular disease 07/31/2017   Poor compliance with CPAP treatment 07/31/2017   Absolute anemia 05/16/2017   Avitaminosis D 05/16/2017   Benign  essential HTN 05/16/2017   Benign prostatic hypertrophy without urinary obstruction 05/16/2017   Clinical depression 05/16/2017   CN (constipation) 05/16/2017   Current drug use 05/16/2017   Diabetic neuropathy (HCC) 05/16/2017   Genital herpes 05/16/2017   Infarction of lung due to iatrogenic pulmonary embolism (HCC) 05/16/2017   Sciatica associated with disorder of lumbar spine 05/16/2017   Arteriosclerosis of  coronary artery 05/16/2017   Artery disease, cerebral 05/16/2017   Apnea, sleep 05/16/2017   Arthralgia of hip or thigh 05/16/2017   Malignant neoplasm of prostate (HCC) 09/28/2016   Vascular dementia in remission (HCC) 09/28/2016   Remote history of stroke 09/28/2016   Acute kidney injury (HCC) 05/18/2016   Dehydration 05/18/2016   Near syncope 05/18/2016   Orthostatic hypotension 05/18/2016   CKD (chronic kidney disease), stage II 04/26/2016   UTI (urinary tract infection) 04/26/2016   Encounter for counseling on use of CPAP 11/18/2015   Chronic cholecystitis with calculus 11/02/2015   Stroke, 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HCC) 04/22/2015   TIA (transient ischemic attack) 11/03/2014   OSA on CPAP 05/18/2014   White matter disease 08/14/2013   Abnormal x-ray of temporomandibular joint 05/30/2013   Chronic infection of sinus 05/30/2013   Cough 05/30/2013   Fatigue 05/30/2013   Unsteady gait 05/19/2013   Combined fat and carbohydrate induced hyperlipemia 04/24/2013   Syncope 03/20/2013   Angina pectoris (HCC) 10/08/2012   Hypertension    Stroke (HCC)    History of TIAs    Lumbago    Other and unspecified hyperlipidemia    Personal history of unspecified circulatory disease    Unwitnessed fall    Pain in joint, multiple sites    Degeneration of cervical intervertebral disc    Unspecified cardiovascular disease    History of pulmonary embolism: June 2014,  Takes Xarelto 07/01/2012    Class: History of   Hemiparesis (HCC) 07/18/2011   Diabetes mellitus type 2 with complications (HCC) 07/18/2011   CAD in native artery 07/18/2011   History of recurrent TIAs 07/18/2011   PCP:  Daisy Floro, MD Pharmacy:   [id number] - [place], [place] - [address]  [address] Phone: [phone] Fax: [phone]  [facility] - [place], [place] - [address] [place] [address] Phone:  [phone] Fax: [phone]  [facility] [address] [place] [address] Phone: [phone] Fax: [phone]     Social Determinants of Health (SDOH) Social History: SDOH Screenings   Food Insecurity: No Food Insecurity (08/06/2022)  Housing: Low Risk  (08/06/2022)  Transportation Needs: No Transportation Needs (08/06/2022)  Utilities: Not At Risk (08/06/2022)  Depression (PHQ2-9): Low Risk  (09/09/2019)  Recent Concern: Depression (PHQ2-9) - Medium Risk (08/01/2019)  Financial Resource Strain: Low Risk  (02/10/2019)  Physical Activity: Inactive (09/09/2019)  Social Connections: Unknown (05/20/2021)   Received from [facility]  Tobacco Use: Low Risk  (08/06/2022)   SDOH Interventions:     Readmission Risk Interventions     No data to display

## 2022-08-08 DIAGNOSIS — R55 Syncope and collapse: Secondary | ICD-10-CM | POA: Diagnosis not present

## 2022-08-08 LAB — GLUCOSE, CAPILLARY
Glucose-Capillary: 194 mg/dL — ABNORMAL HIGH (ref 70–99)
Glucose-Capillary: 252 mg/dL — ABNORMAL HIGH (ref 70–99)

## 2022-08-08 MED ORDER — INSULIN GLARGINE-YFGN 100 UNIT/ML ~~LOC~~ SOLN
10.0000 [IU] | Freq: Every day | SUBCUTANEOUS | Status: DC
  Filled 2022-08-08: qty 0.1

## 2022-08-08 MED ORDER — INSULIN GLARGINE-YFGN 100 UNIT/ML ~~LOC~~ SOLN: 12.0000 [IU] | SUBCUTANEOUS | Status: DC

## 2022-08-08 NOTE — Discharge Summary (Signed)
Physician Discharge Summary  Steve Brown GMW:[id number] DOB: October 15, 1940 DOA: 08/06/2022  PCP: Daisy Floro, MD  Admit date: 08/06/2022 Discharge date: 08/08/2022 Recommendations for Outpatient Follow-up:  Follow up with PCP in 1 weeks-call for appointment Please obtain BMP/CBC in one week  Discharge Dispo: home Discharge Condition: Stable Code Status:   Code Status: Full Code Diet recommendation:  Diet Order             Diet heart healthy/carb modified Room service appropriate? Yes; Fluid consistency: Thin  Diet effective now                    Brief/Interim Summary: 82 yyom w/ history of CAD, IDDM-2 With neuropathy, PE on Xarelto, OSA, CVA, HTN, CKD-3A and BPH presented with syncopal episode. He was at [employer]. he felt unwell when he stood so sat down.He asked charge members to take him to his car.  They took him out and he was sitting on the chair outside when he suddenly passed out and fell of the chair and struck his right head to the ground. He was told his blood pressure was low but does not remember the numbers.  Per daughter, he lost consciousness for few seconds.  No seizure-like activity.  No bowel or bladder accidents. No tongue bite.  Patient remembers feeling unwell and lightheaded before the fall.He is on Xarelto for PE.  Reports using diuretics intermittently.  Reports taking his morning medications.  No other significant complaint/GI symptoms, urinary symptoms focal weakness  At baseline uses cane and rolling walker intermittently. In the ED: vitals stable except for bradycardia to 50s. Cr 1.8 (baseline about 1.4).  BUN 40.  Otherwise CMP without significant finding.  Hgb 11.8.  Platelet 143.  Troponin 63.  INR 2.2.  PTT 24.5.  CXR suggests cardiomegaly. CT head and CT cervical spine>no acute finding.EKG sinus bradycardia to 55. Received Tylenol and hospital service called for admission for syncope. Patient's orthostatic vitals were negative, was given IV fluids  overnight with his creatinine improved and back to baseline, also noticed to be hypoglycemic diabetic medication has been adjusted.  PT OT has seen the patient and cleared, once echo results are normal he will be discharged home.  His blood sugar remains on higher side without hypoglycemia resume long-acting insulin at lower dose with instruction to monitor at home and uptitrate dose follow-up with PCP.  Orthostatic vitals were negative. echo obtained shows EF 55 to 60%, no RWMA G1 DD RV size is normal mitral valve is normal, aortic valve is tricuspid. At this time patient is medically stable for discharge   Discharge Diagnoses:  Principal Problem:   Syncope Active Problems:   Diabetes mellitus type 2 with complications [facility])   History of pulmonary embolism: June 2014,  Takes Xarelto   OSA on CPAP   Acute kidney injury (HCC)   Benign essential HTN   Diabetic neuropathy (HCC)   Obesity  Syncope: reportedly hypotensive on the scene, in the setting of multiple antihypertensive medication, at risk of polypharmacy.  EKG with mild sinus bradycardia, CT head C-spine no acute finding neurological symptoms on admission low suspicion for seizure.  Patient at this time clinically improved orthostatic vitals negative did well with PT OT and recommending for outpatient PT.  Echocardiogram normal.  Encouraged to hydrate orally while at home  AKI on CKD 3A  baseline creatinine 1.3-1.4 unsure if using hydrochlorothiazide-held Lasix and losartan now creatinine improved can resume home meds upon discharge and follow-up with PCP  Recent Labs    10/06/21 1735 12/05/21 1519 02/06/22 1334 04/10/22 1333 04/21/22 0636 05/02/22 1234 08/06/22 1251 08/07/22 0236  BUN 42* 25* 20 29* 21 21 40* 31*  CREATININE 1.56* 1.79* 1.50* 1.47* 1.30* 1.41* 1.80* 1.38*  CO2 28 28 28 25   --  30 26 27      IDDM with neuropathy hyperglycemia, episode of hypoglycemia-, last A1c 7.5 on Januvia Tresiba 36 u and NovoLog tid at  home.  A1c remains elevated patient will continue on sliding scale insulin and long-acting insulin he was instructed to resume insulin at lower dose and uptitrate as his oral intake increases will follow-up with PCP to adjust the dose .cbg in 200s mid Recent Labs  Lab 08/06/22 1529 08/06/22 1709 08/07/22 1351 08/07/22 1655 08/07/22 2157 08/08/22 0719 08/08/22 1131  GLUCAP  --    < > 285* 276* 340* 194* 252*  HGBA1C 10.1*  --   --   --   --   --   --    < > = values in this interval not displayed.      Essential hypertension: Reportedly hypotensive before arrival.  Currently normotensive. Cont to hold antihypertensive meds for now   Sinus bradycardia: improved   History of pulmonary embolism: On Xarelto.  CT head without acute finding.Continue home Xarelto    History of CVA: No focal neurodeficit. Continue home meds after med rec  Mood disorder: Mood stable continue home meds   OSA Cont CPAP at night if he allows   At risk for polypharmacy-on Atarax, Norco, Flexeril, meclizine and Lyrica which could increase the risk of fall> he will need to follow-up with PCP will follow-up and discuss  Class I Obesity:Patient's Body mass index is 30.62 kg/m. : Will benefit with PCP follow-up, weight loss  healthy lifestyle and outpatient sleep evaluation.    Consults: None  Subjective: Aaox3, alert awake resting comfortably no new complaints  Discharge Exam: Vitals:   08/08/22 0417 08/08/22 0855  BP: 132/74 127/71  Pulse: (!) 56 (!) 56  Resp: 18 18  Temp: 97.9 F (36.6 C) 98 F (36.7 C)  SpO2: 97% 98%   General: Pt is alert, awake, not in acute distress Cardiovascular: RRR, S1/S2 +, no rubs, no gallops Respiratory: CTA bilaterally, no wheezing, no rhonchi Abdominal: Soft, NT, ND, bowel sounds + Extremities: no edema, no cyanosis  Discharge Instructions  Discharge Instructions     Discharge instructions   Complete by: As directed    Please call call MD or return to  ER for similar or worsening recurring problem that brought you to hospital or if any fever,nausea/vomiting,abdominal pain, uncontrolled pain, chest pain,  shortness of breath or any other alarming symptoms.  Please follow-up your doctor as instructed in a week time and call the office for appointment.  Please avoid alcohol, smoking, or any other illicit substance and maintain healthy habits including taking your regular medications as prescribed.  You were cared for by a hospitalist during your hospital stay. If you have any questions about your discharge medications or the care you received while you were in the hospital after you are discharged, you can call the unit and ask to speak with the hospitalist on call if the hospitalist that took care of you is not available.  Once you are discharged, your primary care physician will handle any further medical issues. Please note that NO REFILLS for any discharge medications will be authorized once you are discharged, as it is imperative that you  return to your primary care physician (or establish a relationship with a primary care physician if you do not have one) for your aftercare needs so that they can reassess your need for medications and monitor your lab values  Check blood sugar 3 times a day and bedtime at home. If blood sugar running above 200 less than 70 please call your MD to adjust insulin. If blood sugars running less 100 do not use insulin and call MD. If you noticed signs and symptoms of hypoglycemia or low blood sugar like jitteriness, confusion, thirst, tremor, sweating- Check blood sugar, drink sugary drink/biscuits/sweets to increase sugar level and call MD or return to ER.   Increase activity slowly   Complete by: As directed       Allergies as of 08/08/2022       Reactions   Other Other (See Comments)   Per patient- cardiac cath dye-  "woke up during procedure hysterical."   Phenergan [promethazine] Other (See Comments)    Mood changes    Iohexol Other (See Comments)   Patient refuses contrast after having a hysterical event in hospital //r ls spoke with patient    Phenergan [promethazine]    Other reaction(s): syncope   Tramadol    Increases Blood Sugar         Medication List     TAKE these medications    busPIRone 5 MG tablet Commonly known as: BUSPAR Take 5 mg by mouth daily.   diclofenac Sodium 1 % Gel Commonly known as: VOLTAREN Apply 4 g topically 4 (four) times daily. What changed:  when to take this reasons to take this   escitalopram 20 MG tablet Commonly known as: LEXAPRO Take 20 mg by mouth at bedtime.   furosemide 20 MG tablet Commonly known as: LASIX Take 1-2 tablets (20-40 mg total) by mouth daily as needed for fluid. What changed: how much to take   hydrOXYzine 25 MG capsule Commonly known as: VISTARIL Take 25 mg by mouth every 8 (eight) hours as needed for anxiety.   insulin aspart 100 UNIT/ML FlexPen Commonly known as: NOVOLOG Inject 5 Units into the skin 3 (three) times daily with meals. What changed: how much to take   ipratropium 0.06 % nasal spray Commonly known as: ATROVENT Place 2 sprays into both nostrils 3 (three) times daily.   ketoconazole 2 % cream Commonly known as: NIZORAL Apply 1 Application topically 2 (two) times daily.   losartan 25 MG tablet Commonly known as: COZAAR Take 25 mg by mouth daily.   nitroGLYCERIN 0.4 MG SL tablet Commonly known as: NITROSTAT Place 0.4 mg under the tongue every 5 (five) minutes as needed for chest pain.   nystatin powder Commonly known as: MYCOSTATIN/NYSTOP Apply 1 Application topically as needed (irritation).   nystatin cream Commonly known as: MYCOSTATIN Apply 1 Application topically as needed for rash.   pregabalin 300 MG capsule Commonly known as: LYRICA Take 300 mg by mouth 2 (two) times daily.   rosuvastatin 20 MG tablet Commonly known as: CRESTOR TAKE 1 TABLET BY MOUTH EVERY DAY   Tresiba  FlexTouch 100 UNIT/ML FlexTouch Pen Generic drug: insulin degludec Inject 34 Units into the skin daily. What changed: how much to take   Vitamin D-1000 Max St 25 MCG (1000 UT) tablet Generic drug: Cholecalciferol Take 1,000 Units by mouth daily.   Xarelto 20 MG Tabs tablet Generic drug: rivaroxaban Take 1 tablet (20 mg total) by mouth daily.  Allergies  Allergen Reactions   Other Other (See Comments)    Per patient- cardiac cath dye-  "woke up during procedure hysterical."   Phenergan [Promethazine] Other (See Comments)    Mood changes    Iohexol Other (See Comments)    Patient refuses contrast after having a hysterical event in hospital //r ls spoke with patient    Phenergan [Promethazine]     Other reaction(s): syncope   Tramadol     Increases Blood Sugar     The results of significant diagnostics from this hospitalization (including imaging, microbiology, ancillary and laboratory) are listed below for reference.    Microbiology: No results found for this or any previous visit (from the past 240 hour(s)).  Procedures/Studies: ECHOCARDIOGRAM COMPLETE  Result Date: 08/07/2022    ECHOCARDIOGRAM REPORT   Patient Name:   Steve Brown Date of Exam: 08/07/2022 Medical Rec #:  [id number]        Height:       65.0 in Accession #:    [id number]       Weight:       184.0 lb Date of Birth:  [birth year]         BSA:          1.909 m Patient Age:    82 years         BP:           149/86 mmHg Patient Gender: M                HR:           57 bpm. Exam Location:  Inpatient Procedure: 2D Echo, Cardiac Doppler and Color Doppler Indications:    Syncope R55  History:        Patient has prior history of Echocardiogram examinations, most                 recent 05/26/2020. CAD, Stroke and TIA, Signs/Symptoms:Syncope;                 Risk Factors:Hypertension, Sleep Apnea and Diabetes. CKD, stage                 2.  Sonographer:    Lucendia Herrlich Referring Phys: [id number] TAYE T GONFA  IMPRESSIONS  1. Left ventricular ejection fraction, by estimation, is 55 to 60%. The left ventricle has normal function. The left ventricle has no regional wall motion abnormalities. There is moderate concentric left ventricular hypertrophy. Left ventricular diastolic parameters are consistent with Grade I diastolic dysfunction (impaired relaxation).  2. Right ventricular systolic function is normal. The right ventricular size is normal. There is normal pulmonary artery systolic pressure. The estimated right ventricular systolic pressure is 28.6 mmHg.  3. Left atrial size was mildly dilated.  4. Right atrial size was mildly dilated.  5. The mitral valve is normal in structure. Trivial mitral valve regurgitation. No evidence of mitral stenosis.  6. The aortic valve is tricuspid. There is mild calcification of the aortic valve. Aortic valve regurgitation is trivial. No aortic stenosis is present.  7. The inferior vena cava is normal in size with greater than 50% respiratory variability, suggesting right atrial pressure of 3 mmHg. FINDINGS  Left Ventricle: Left ventricular ejection fraction, by estimation, is 55 to 60%. The left ventricle has normal function. The left ventricle has no regional wall motion abnormalities. The left ventricular internal cavity size was normal in size. There is  moderate concentric left ventricular hypertrophy. Left ventricular diastolic parameters are  consistent with Grade I diastolic dysfunction (impaired relaxation). Right Ventricle: The right ventricular size is normal. No increase in right ventricular wall thickness. Right ventricular systolic function is normal. There is normal pulmonary artery systolic pressure. The tricuspid regurgitant velocity is 2.53 m/s, and  with an assumed right atrial pressure of 3 mmHg, the estimated right ventricular systolic pressure is 28.6 mmHg. Left Atrium: Left atrial size was mildly dilated. Right Atrium: Right atrial size was mildly dilated.  Pericardium: There is no evidence of pericardial effusion. Mitral Valve: The mitral valve is normal in structure. Trivial mitral valve regurgitation. No evidence of mitral valve stenosis. Tricuspid Valve: The tricuspid valve is normal in structure. Tricuspid valve regurgitation is trivial. Aortic Valve: The aortic valve is tricuspid. There is mild calcification of the aortic valve. Aortic valve regurgitation is trivial. Aortic regurgitation PHT measures 560 msec. No aortic stenosis is present. Aortic valve peak gradient measures 5.7 mmHg. Pulmonic Valve: The pulmonic valve was normal in structure. Pulmonic valve regurgitation is trivial. Aorta: The aortic root is normal in size and structure. Venous: The inferior vena cava is normal in size with greater than 50% respiratory variability, suggesting right atrial pressure of 3 mmHg. IAS/Shunts: No atrial level shunt detected by color flow Doppler.  LEFT VENTRICLE PLAX 2D LVIDd:         4.20 cm   Diastology LVIDs:         2.90 cm   LV e' medial:    6.99 cm/s LV PW:         1.20 cm   LV E/e' medial:  10.9 LV IVS:        1.40 cm   LV e' lateral:   7.30 cm/s LVOT diam:     2.20 cm   LV E/e' lateral: 10.4 LV SV:         76 LV SV Index:   40 LVOT Area:     3.80 cm  RIGHT VENTRICLE             IVC RV S prime:     12.90 cm/s  IVC diam: 1.20 cm TAPSE (M-mode): 2.2 cm LEFT ATRIUM             Index        RIGHT ATRIUM           Index LA diam:        3.90 cm 2.04 cm/m   RA Area:     19.00 cm LA Vol (A2C):   52.8 ml 27.65 ml/m  RA Volume:   49.70 ml  26.03 ml/m LA Vol (A4C):   62.7 ml 32.84 ml/m LA Biplane Vol: 59.3 ml 31.06 ml/m  AORTIC VALVE                 PULMONIC VALVE AV Area (Vmax): 2.98 cm     PR End Diast Vel: 2.81 msec AV Vmax:        119.00 cm/s AV Peak Grad:   5.7 mmHg LVOT Vmax:      93.43 cm/s LVOT Vmean:     58.867 cm/s LVOT VTI:       0.200 m AI PHT:         560 msec  AORTA Ao Root diam: 3.50 cm Ao Asc diam:  3.50 cm MITRAL VALVE               TRICUSPID  VALVE MV Area (PHT): 4.39 cm    TR Peak grad:   25.6 mmHg  MV Decel Time: 173 msec    TR Vmax:        253.00 cm/s MR Peak grad: 19.7 mmHg MR Vmax:      222.00 cm/s  SHUNTS MV E velocity: 76.20 cm/s  Systemic VTI:  0.20 m MV A velocity: 68.90 cm/s  Systemic Diam: 2.20 cm MV E/A ratio:  1.11 Dalton McleanMD Electronically signed by Wilfred Lacy Signature Date/Time: 08/07/2022/4:59:48 PM    Final    CT Cervical Spine Wo Contrast  Result Date: 08/06/2022 CLINICAL DATA:  Neck trauma. Syncopal episode. Patient struck head. EXAM: CT CERVICAL SPINE WITHOUT CONTRAST TECHNIQUE: Multidetector CT imaging of the cervical spine was performed without intravenous contrast. Multiplanar CT image reconstructions were also generated. RADIATION DOSE REDUCTION: This exam was performed according to the departmental dose-optimization program which includes automated exposure control, adjustment of the mA and/or kV according to patient size and/or use of iterative reconstruction technique. COMPARISON:  CT of the cervical spine 09/03/2019 FINDINGS: Alignment: No significant listhesis is present. Straightening of the normal cervical lordosis is again seen. Skull base and vertebrae: Corpectomy strut grafting noted from C2 through C5. Ankylosis is present across the C5-6 disc space. Hardware is intact and stable. No acute fractures are present. Soft tissues and spinal canal: Atherosclerotic calcifications are present at the carotid bifurcations bilaterally without definite stenosis. No prevertebral fluid or swelling is present. Visible canal hematoma is present. Disc levels: Uncovertebral spurring results in moderate foraminal narrowing bilaterally at C6-7, right greater than left. Uncovertebral spurring contributes to moderate bilateral foraminal narrowing at C7-T1. Upper chest: The lung apices are clear. The thoracic inlet is within normal limits. IMPRESSION: 1. No acute fracture or traumatic subluxation. 2. Corpectomy strut grafting  from C2 through C5. 3. Ankylosis across the C5-6 disc space. 4. Moderate foraminal narrowing bilaterally at C6-7 and C7-T1. Electronically Signed   By: Marin Roberts M.D.   On: 08/06/2022 13:32   CT Head Wo Contrast  Result Date: 08/06/2022 CLINICAL DATA:  Head trauma, minor. Syncopal episode. Patient hit head. Patient is on Xarelto. EXAM: CT HEAD WITHOUT CONTRAST TECHNIQUE: Contiguous axial images were obtained from the base of the skull through the vertex without intravenous contrast. RADIATION DOSE REDUCTION: This exam was performed according to the departmental dose-optimization program which includes automated exposure control, adjustment of the mA and/or kV according to patient size and/or use of iterative reconstruction technique. COMPARISON:  CT head without contrast 05/29/2022. MR head without contrast 02/07/2022 FINDINGS: Brain: No acute infarct, hemorrhage, or mass lesion is present. No significant white matter lesions are present. The ventricles are of normal size. Deep brain nuclei are within normal limits. No significant extraaxial fluid collection is present. Remote left PICA territory infarct is stable. The brainstem and cerebellum are otherwise within normal limits. Midline structures are within normal limits. Vascular: Atherosclerotic calcifications are present within cavernous internal carotid artery and at the dural margin of the right vertebral artery. No hyperdense vessels are present distally. Skull: A left parietal scalp hematoma is present. No underlying fracture is present. A left frontal scalp lipoma is present. No other focal scalp lesions are present. Sinuses/Orbits: Chronic opacification of the right sphenoid sinus is again noted. Mild mucosal thickening is present left sphenoid sinus. Posterior left ethmoid air cells are opacified. Mild mucosal thickening is present in the inferior maxillary sinuses. The paranasal sinuses are otherwise clear. The mastoid air cells are clear.  Bilateral lens replacements are noted. Globes and orbits are otherwise unremarkable. IMPRESSION: 1. Left parietal scalp  hematoma without underlying fracture. 2. Stable remote left PICA territory infarct. 3. No acute intracranial abnormality or significant interval change. Electronically Signed   By: Marin Roberts M.D.   On: 08/06/2022 13:29   DG Chest Port 1 View  Result Date: 08/06/2022 CLINICAL DATA:  Syncope EXAM: PORTABLE CHEST 1 VIEW COMPARISON:  04/10/2022 x-ray FINDINGS: Overlapping artifacts. Fixation hardware along the lower cervical spine at the edge of the imaging field. Enlarged cardiopericardial silhouette with calcified aorta. Slight prominence of the central vasculature. Bandlike opacity left lung base. Atelectasis is favored over infiltrate. There is some peribronchial thickening identified on the left. No pneumothorax or effusion. No edema. Overlapping cardiac leads. Degenerative changes of the spine IMPRESSION: Enlarged heart with calcified tortuous aorta. Slight prominence of the central vasculature. Left basilar atelectasis favored over infiltrate. Electronically Signed   By: Karen Kays M.D.   On: 08/06/2022 13:13    Labs: BNP (last 3 results) No results for input(s): "BNP" in the last 8760 hours. Basic Metabolic Panel: Recent Labs  Lab 08/06/22 1251 08/07/22 0236  NA 138 138  K 4.1 3.8  CL 104 106  CO2 26 27  GLUCOSE 141* 101*  BUN 40* 31*  CREATININE 1.80* 1.38*  CALCIUM 8.2* 8.1*  MG  --  1.8   Liver Function Tests: Recent Labs  Lab 08/06/22 1251  AST 17  ALT 12  ALKPHOS 79  BILITOT 0.5  PROT 5.8*  ALBUMIN 2.9*   No results for input(s): "LIPASE", "AMYLASE" in the last 168 hours. No results for input(s): "AMMONIA" in the last 168 hours. CBC: Recent Labs  Lab 08/06/22 1251 08/07/22 0236  WBC 4.4 4.7  HGB 11.8* 12.0*  HCT 35.6* 35.5*  MCV 93.9 91.7  PLT 143* 145*   Recent Labs  Lab 08/07/22 1351 08/07/22 1655 08/07/22 2157  08/08/22 0719 08/08/22 1131  GLUCAP 285* 276* 340* 194* 252*   Recent Labs    08/06/22 1529  HGBA1C 10.1*      Component Value Date/Time   COLORURINE YELLOW 02/07/2022 0238   APPEARANCEUR TURBID (A) 02/07/2022 0238   LABSPEC 1.014 02/07/2022 0238   LABSPEC 1.015 12/08/2016 1408   PHURINE 6.0 02/07/2022 0238   GLUCOSEU NEGATIVE 02/07/2022 0238   GLUCOSEU Negative 12/08/2016 1408   HGBUR SMALL (A) 02/07/2022 0238   BILIRUBINUR NEGATIVE 02/07/2022 0238   BILIRUBINUR negative 07/05/2020 1611   BILIRUBINUR Negative 12/08/2016 1408   KETONESUR 5 (A) 02/07/2022 0238   PROTEINUR >=300 (A) 02/07/2022 0238   UROBILINOGEN 0.2 07/05/2020 1611   UROBILINOGEN 0.2 12/08/2016 1408   NITRITE POSITIVE (A) 02/07/2022 0238   LEUKOCYTESUR MODERATE (A) 02/07/2022 0238   LEUKOCYTESUR Moderate 12/08/2016 1408   Sepsis Labs Recent Labs  Lab 08/06/22 1251 08/07/22 0236  WBC 4.4 4.7   Microbiology No results found for this or any previous visit (from the past 240 hour(s)).   Time coordinating discharge: 25 minutes  SIGNED: Lanae Boast, MD  [facility] 08/08/2022, 12:53 PM  If 7PM-7AM, please contact night-coverage [email or web address]

## 2022-08-08 NOTE — Plan of Care (Signed)
  Problem: Education: Goal: Ability to describe self-care measures that may prevent or decrease complications (Diabetes Survival Skills Education) will improve Outcome: Completed/Met Goal: Individualized Educational Video(s) Outcome: Not Applicable   

## 2022-08-09 ENCOUNTER — Other Ambulatory Visit: Payer: Self-pay

## 2022-08-09 ENCOUNTER — Emergency Department (HOSPITAL_COMMUNITY): Payer: Medicare Other

## 2022-08-09 ENCOUNTER — Emergency Department (HOSPITAL_COMMUNITY)
Admission: EM | Admit: 2022-08-09 | Discharge: 2022-08-10 | Disposition: A | Discharge status: Home / Self Care | Payer: Medicare Other | Attending: Emergency Medicine | Admitting: Emergency Medicine

## 2022-08-09 DIAGNOSIS — W19XXXA Unspecified fall, initial encounter: Secondary | ICD-10-CM | POA: Diagnosis not present

## 2022-08-09 DIAGNOSIS — S0990XA Unspecified injury of head, initial encounter: Secondary | ICD-10-CM | POA: Diagnosis not present

## 2022-08-09 DIAGNOSIS — I6782 Cerebral ischemia: Secondary | ICD-10-CM | POA: Diagnosis not present

## 2022-08-09 DIAGNOSIS — R42 Dizziness and giddiness: Secondary | ICD-10-CM | POA: Diagnosis not present

## 2022-08-09 DIAGNOSIS — R55 Syncope and collapse: Secondary | ICD-10-CM | POA: Insufficient documentation

## 2022-08-09 DIAGNOSIS — R5383 Other fatigue: Secondary | ICD-10-CM | POA: Diagnosis not present

## 2022-08-09 DIAGNOSIS — Z043 Encounter for examination and observation following other accident: Secondary | ICD-10-CM | POA: Diagnosis not present

## 2022-08-09 DIAGNOSIS — R739 Hyperglycemia, unspecified: Secondary | ICD-10-CM | POA: Diagnosis not present

## 2022-08-09 DIAGNOSIS — I700 Atherosclerosis of aorta: Secondary | ICD-10-CM | POA: Diagnosis not present

## 2022-08-09 DIAGNOSIS — R0989 Other specified symptoms and signs involving the circulatory and respiratory systems: Secondary | ICD-10-CM | POA: Diagnosis not present

## 2022-08-09 DIAGNOSIS — R5381 Other malaise: Secondary | ICD-10-CM | POA: Diagnosis not present

## 2022-08-09 DIAGNOSIS — I517 Cardiomegaly: Secondary | ICD-10-CM | POA: Diagnosis not present

## 2022-08-09 LAB — COMPREHENSIVE METABOLIC PANEL
ALT: 17 U/L (ref 0–44)
AST: 17 U/L (ref 15–41)
Albumin: 3.3 g/dL — ABNORMAL LOW (ref 3.5–5.0)
Alkaline Phosphatase: 62 U/L (ref 38–126)
Anion gap: 5 (ref 5–15)
BUN: 16 mg/dL (ref 8–23)
CO2: 31 mmol/L (ref 22–32)
Calcium: 9.2 mg/dL (ref 8.9–10.3)
Chloride: 98 mmol/L (ref 98–111)
Creatinine, Ser: 1.29 mg/dL — ABNORMAL HIGH (ref 0.61–1.24)
GFR, Estimated: 56 mL/min — ABNORMAL LOW (ref >60)
Glucose, Bld: 224 mg/dL — ABNORMAL HIGH (ref 70–99)
Potassium: 4.8 mmol/L (ref 3.5–5.1)
Sodium: 134 mmol/L — ABNORMAL LOW (ref 135–145)
Total Bilirubin: 1.1 mg/dL (ref 0.3–1.2)
Total Protein: 6.4 g/dL — ABNORMAL LOW (ref 6.5–8.1)

## 2022-08-09 LAB — CBC WITH DIFFERENTIAL/PLATELET
Abs Immature Granulocytes: 0.01 10*3/uL (ref 0.00–0.07)
Basophils Absolute: 0 10*3/uL (ref 0.0–0.1)
Basophils Relative: 1 %
Eosinophils Absolute: 0 10*3/uL (ref 0.0–0.5)
Eosinophils Relative: 0 %
HCT: 37.1 % — ABNORMAL LOW (ref 39.0–52.0)
Hemoglobin: 12.7 g/dL — ABNORMAL LOW (ref 13.0–17.0)
Immature Granulocytes: 0 %
Lymphocytes Relative: 39 %
Lymphs Abs: 2 10*3/uL (ref 0.7–4.0)
MCH: 31.4 pg (ref 26.0–34.0)
MCHC: 34.2 g/dL (ref 30.0–36.0)
MCV: 91.8 fL (ref 80.0–100.0)
Monocytes Absolute: 0.6 10*3/uL (ref 0.1–1.0)
Monocytes Relative: 11 %
Neutro Abs: 2.6 10*3/uL (ref 1.7–7.7)
Neutrophils Relative %: 49 %
Platelets: 154 10*3/uL (ref 150–400)
RBC: 4.04 MIL/uL — ABNORMAL LOW (ref 4.22–5.81)
RDW: 12.9 % (ref 11.5–15.5)
WBC: 5.2 10*3/uL (ref 4.0–10.5)
nRBC: 0 % (ref 0.0–0.2)

## 2022-08-09 LAB — TROPONIN I (HIGH SENSITIVITY): Troponin I (High Sensitivity): 34 ng/L — ABNORMAL HIGH (ref <18)

## 2022-08-09 MED ORDER — MECLIZINE HCL 25 MG PO TABS: 25.0000 mg | ORAL_TABLET | Freq: Two times a day (BID) | ORAL | 0 refills | Status: DC | PRN

## 2022-08-09 MED ORDER — LACTATED RINGERS IV BOLUS
1000.0000 mL | Freq: Once | INTRAVENOUS | Status: AC
  Administered 2022-08-09: 1000 mL via INTRAVENOUS

## 2022-08-09 NOTE — ED Provider Notes (Deleted)
[place] EMERGENCY DEPARTMENT AT [facility] Provider Note   CSN: [id number] Arrival date & time: 08/09/22  2001     History No chief complaint on file.   HPI Steve Brown is a 82 y.o. male presenting for generalized malaise and fatigue.  Had a fall/syncopal event at church on Sunday found to be orthostasis secondary to dehydration.  He had an AKI but responded substantially to IV fluids.  Had a negative echocardiogram negative objective findings and was ultimately cleared for discharge at that time.  Since he got home.  He states that he still having intermittent dizziness and lightheadedness but denies fevers chills nausea vomiting shortness of breath.  He is follow-up with his PCP within 48 hours..   Patient's recorded medical, surgical, social, medication list and allergies were reviewed in the Snapshot window as part of the initial history.   Review of Systems   Review of Systems  Constitutional:  Negative for chills and fever.  HENT:  Negative for ear pain and sore throat.   Eyes:  Negative for pain and visual disturbance.  Respiratory:  Negative for cough and shortness of breath.   Cardiovascular:  Negative for chest pain and palpitations.  Gastrointestinal:  Negative for abdominal pain and vomiting.  Genitourinary:  Negative for dysuria and hematuria.  Musculoskeletal:  Negative for arthralgias and back pain.  Skin:  Negative for color change and rash.  Neurological:  Negative for seizures and syncope.  All other systems reviewed and are negative.   Physical Exam Updated Vital Signs Temp 99 F (37.2 C) (Oral)   Ht 5\' 5"  (1.651 m)   Wt 83.5 kg   BMI 30.62 kg/m  Physical Exam Vitals and nursing note reviewed.  Constitutional:      General: He is not in acute distress.    Appearance: He is well-developed.  HENT:     Head: Normocephalic and atraumatic.  Eyes:     Conjunctiva/sclera: Conjunctivae normal.  Cardiovascular:     Rate and Rhythm:  Normal rate and regular rhythm.     Heart sounds: No murmur heard. Pulmonary:     Effort: Pulmonary effort is normal. No respiratory distress.     Breath sounds: Normal breath sounds.  Abdominal:     Palpations: Abdomen is soft.     Tenderness: There is no abdominal tenderness.  Musculoskeletal:        General: No swelling.     Cervical back: Neck supple.  Skin:    General: Skin is warm and dry.     Capillary Refill: Capillary refill takes less than 2 seconds.  Neurological:     Mental Status: He is alert.  Psychiatric:        Mood and Affect: Mood normal.      ED Course/ Medical Decision Making/ A&P Clinical Course as of 08/09/22 2348  Wed Aug 09, 2022  2248 Troponin, creatinine, CT, chest x-ray all without acute pathology. [CC]    Clinical Course User Index [CC] Glyn Ade, MD    Procedures Procedures   Medications Ordered in ED Medications  lactated ringers bolus 1,000 mL (1,000 mLs Intravenous New Bag/Given 08/09/22 2232)    Medical Decision Making:    Steve Brown is a 82 y.o. male who presented to the ED today with generalized malaise and fatigue detailed above.     Patient's presentation is complicated by their history of multiple comorbid medical problems.  Patient placed on continuous vitals and telemetry monitoring while in ED which was  reviewed periodically.   Complete initial physical exam performed, notably the patient  was hemodynamically stable no acute distress.      Reviewed and confirmed nursing documentation for past medical history, family history, social history.    Initial Assessment:   With the patient's presentation of generalized malaise and fatigue, most likely diagnosis is nonspecific etiology such as dehydration. Other diagnoses were considered including (but not limited to) metabolic crisis, Anemia, infectious etiology such as pneumonia or urinary tract infection. These are considered less likely due to history of present  illness and physical exam findings.   This is most consistent with an acute life/limb threatening illness complicated by underlying chronic conditions.  Initial Plan:  IV fluids Screening labs including CBC and Metabolic panel to evaluate for infectious or metabolic etiology of disease.  Urinalysis with reflex culture ordered to evaluate for UTI or relevant urologic/nephrologic pathology.  CXR to evaluate for structural/infectious intrathoracic pathology.  Troponin /EKG to evaluate for cardiac pathology. CT head to evaluate for structural intracranial etiology of patient's dizziness and lightheadedness Objective evaluation as below reviewed with plan for close reassessment  Initial Study Results:   Laboratory  All laboratory results reviewed without evidence of clinically relevant pathology.     EKG EKG was reviewed independently. Rate, rhythm, axis, intervals all examined and without medically relevant abnormality. ST segments without concerns for elevations.    Radiology  All images reviewed independently. Agree with radiology report at this time.   DG Chest Portable 1 View  Result Date: 08/09/2022 CLINICAL DATA:  Fall EXAM: PORTABLE CHEST 1 VIEW COMPARISON:  08/06/2022 FINDINGS: Mild cardiomegaly. Low lung volume. Aortic atherosclerosis. No acute airspace disease, pleural effusion, or pneumothorax. IMPRESSION: Low lung volume. Mild cardiomegaly. Electronically Signed   By: Jasmine Pang M.D.   On: 08/09/2022 22:19   CT HEAD WO CONTRAST ( )  Result Date: 08/09/2022 CLINICAL DATA:  Head trauma hypotension EXAM: CT HEAD WITHOUT CONTRAST TECHNIQUE: Contiguous axial images were obtained from the base of the skull through the vertex without intravenous contrast. RADIATION DOSE REDUCTION: This exam was performed according to the departmental dose-optimization program which includes automated exposure control, adjustment of the mA and/or kV according to patient size and/or use of iterative  reconstruction technique. COMPARISON:  CT brain 08/06/2022 FINDINGS: Brain: No acute territorial infarction, hemorrhage, or intracranial mass. Atrophy. Mild chronic small vessel ischemic changes of the white matter. Chronic left cerebellar infarct. Stable ventricle size. Vascular: No hyperdense vessels. Vertebral and carotid vascular calcification. Skull: Normal. Negative for fracture or focal lesion. Sinuses/Orbits: Opacified right sphenoid sinus. Moderate mucosal thickening elsewhere Other: None IMPRESSION: 1. No CT evidence for acute intracranial abnormality. 2. Atrophy and chronic small vessel ischemic changes of the white matter. Chronic left cerebellar infarct. 3. Paranasal sinus disease. Electronically Signed   By: Jasmine Pang M.D.   On: 08/09/2022 22:18      Reassessment and Plan:   Objectively, patient has no focal pathology today.  Lab work is at his baseline, troponin continues to improve,'s creatinine continues to improve.  Discussed all findings with patient who is in agreement.  He has had mild improvement with further IV fluids.  I recommend close follow-up with his primary care provider which is already scheduled.  Will trial meclizine for his intermittent dizziness symptoms currently not present but unlikely to be CVA given serial CTs with no focal pathology and description of the symptoms.   Disposition:  I have considered need for hospitalization, however, considering all of the above,  I believe this patient is stable for discharge at this time.  Patient/family educated about specific return precautions for given chief complaint and symptoms.  Patient/family educated about follow-up with PCP.     Patient/family expressed understanding of return precautions and need for follow-up. Patient spoken to regarding all imaging and laboratory results and appropriate follow up for these results. All education provided in verbal form with additional information in written form. Time was allowed  for answering of patient questions. Patient discharged.    Emergency Department Medication Summary:   Medications  lactated ringers bolus 1,000 mL (1,000 mLs Intravenous New Bag/Given 08/09/22 2232)         Clinical Impression:  1. Dizziness      Data Unavailable   Final Clinical Impression(s) / ED Diagnoses Final diagnoses:  Dizziness    Rx / DC Orders ED Discharge Orders          Ordered    meclizine (ANTIVERT) 25 MG tablet  2 times daily PRN        08/09/22 2348              Glyn Ade, MD 08/09/22 2348

## 2022-08-09 NOTE — ED Triage Notes (Signed)
Pt BIB EMS from home.    Pt reports he was seen Sunday for hypotension, fall and hit head. Has had a constant headache since.  Today pt reports not feeling like himself and doesn't feel any better since being seen. Pt endorses dizziness, fever/chills, lower abd pain.  EMS reports pt was also treated for uti, pt says he was not given any abx.   EMS VS: 130/78, HR 68, 98%, cbg 333

## 2022-08-09 NOTE — ED Triage Notes (Signed)
Pt takes Xarelto.

## 2022-08-10 DIAGNOSIS — R42 Dizziness and giddiness: Secondary | ICD-10-CM | POA: Diagnosis not present

## 2022-08-11 DIAGNOSIS — R8290 Unspecified abnormal findings in urine: Secondary | ICD-10-CM | POA: Diagnosis not present

## 2022-08-11 DIAGNOSIS — N183 Chronic kidney disease, stage 3 unspecified: Secondary | ICD-10-CM | POA: Diagnosis not present

## 2022-08-11 DIAGNOSIS — R399 Unspecified symptoms and signs involving the genitourinary system: Secondary | ICD-10-CM | POA: Diagnosis not present

## 2022-08-14 DIAGNOSIS — R2681 Unsteadiness on feet: Secondary | ICD-10-CM | POA: Diagnosis not present

## 2022-08-14 DIAGNOSIS — R270 Ataxia, unspecified: Secondary | ICD-10-CM | POA: Diagnosis not present

## 2022-08-14 NOTE — ED Provider Notes (Signed)
[place] EMERGENCY DEPARTMENT AT [facility] Provider Note   CSN: [id number] Arrival date & time: 08/09/22  2001     History No chief complaint on file.   HPI Steve Brown is a 82 y.o. male presenting for generalized malaise and fatigue.  Had a fall/syncopal event at church on Sunday found to be orthostasis secondary to dehydration.  He had an AKI but responded substantially to IV fluids.  Had a negative echocardiogram negative objective findings and was ultimately cleared for discharge at that time.  Since he got home.  He states that he still having intermittent dizziness and lightheadedness but denies fevers chills nausea vomiting shortness of breath.  He is follow-up with his PCP within 48 hours..   Patient's recorded medical, surgical, social, medication list and allergies were reviewed in the Snapshot window as part of the initial history.   Review of Systems   Review of Systems  Constitutional:  Negative for chills and fever.  HENT:  Negative for ear pain and sore throat.   Eyes:  Negative for pain and visual disturbance.  Respiratory:  Negative for cough and shortness of breath.   Cardiovascular:  Negative for chest pain and palpitations.  Gastrointestinal:  Negative for abdominal pain and vomiting.  Genitourinary:  Negative for dysuria and hematuria.  Musculoskeletal:  Negative for arthralgias and back pain.  Skin:  Negative for color change and rash.  Neurological:  Negative for seizures and syncope.  All other systems reviewed and are negative.   Physical Exam Updated Vital Signs BP (!) 167/80   Pulse (!) 54   Temp 99 F (37.2 C) (Oral)   Resp 14   Ht 5\' 5"  (1.651 m)   Wt 83.5 kg   SpO2 100%   BMI 30.62 kg/m  Physical Exam Vitals and nursing note reviewed.  Constitutional:      General: He is not in acute distress.    Appearance: He is well-developed.  HENT:     Head: Normocephalic and atraumatic.  Eyes:     Conjunctiva/sclera:  Conjunctivae normal.  Cardiovascular:     Rate and Rhythm: Normal rate and regular rhythm.     Heart sounds: No murmur heard. Pulmonary:     Effort: Pulmonary effort is normal. No respiratory distress.     Breath sounds: Normal breath sounds.  Abdominal:     Palpations: Abdomen is soft.     Tenderness: There is no abdominal tenderness.  Musculoskeletal:        General: No swelling.     Cervical back: Neck supple.  Skin:    General: Skin is warm and dry.     Capillary Refill: Capillary refill takes less than 2 seconds.  Neurological:     Mental Status: He is alert.  Psychiatric:        Mood and Affect: Mood normal.      ED Course/ Medical Decision Making/ A&P Clinical Course as of 08/14/22 1449  Wed Aug 09, 2022  2248 Troponin, creatinine, CT, chest x-ray all without acute pathology. [CC]    Clinical Course User Index [CC] Glyn Ade, MD    Procedures Procedures   Medications Ordered in ED Medications  lactated ringers bolus 1,000 mL (0 mLs Intravenous Stopped 08/10/22 0107)    Medical Decision Making:    Steve Brown is a 82 y.o. male who presented to the ED today with generalized malaise and fatigue detailed above.     Patient's presentation is complicated by their history of multiple  comorbid medical problems.  Patient placed on continuous vitals and telemetry monitoring while in ED which was reviewed periodically.   Complete initial physical exam performed, notably the patient  was hemodynamically stable no acute distress.      Reviewed and confirmed nursing documentation for past medical history, family history, social history.    Initial Assessment:   With the patient's presentation of generalized malaise and fatigue, most likely diagnosis is nonspecific etiology such as dehydration. Other diagnoses were considered including (but not limited to) metabolic crisis, Anemia, infectious etiology such as pneumonia or urinary tract infection. These are  considered less likely due to history of present illness and physical exam findings.   This is most consistent with an acute life/limb threatening illness complicated by underlying chronic conditions.  Initial Plan:  IV fluids Screening labs including CBC and Metabolic panel to evaluate for infectious or metabolic etiology of disease.  Urinalysis with reflex culture ordered to evaluate for UTI or relevant urologic/nephrologic pathology.  CXR to evaluate for structural/infectious intrathoracic pathology.  Troponin /EKG to evaluate for cardiac pathology. CT head to evaluate for structural intracranial etiology of patient's dizziness and lightheadedness Objective evaluation as below reviewed with plan for close reassessment  Initial Study Results:   Laboratory  All laboratory results reviewed without evidence of clinically relevant pathology.     EKG EKG was reviewed independently. Rate, rhythm, axis, intervals all examined and without medically relevant abnormality. ST segments without concerns for elevations.    Radiology  All images reviewed independently. Agree with radiology report at this time.   No results found.    Reassessment and Plan:   Objectively, patient has no focal pathology today.  Lab work is at his baseline, troponin continues to improve,'s creatinine continues to improve.  Discussed all findings with patient who is in agreement.  He has had mild improvement with further IV fluids.  I recommend close follow-up with his primary care provider which is already scheduled.  Will trial meclizine for his intermittent dizziness symptoms currently not present but unlikely to be CVA given serial CTs with no focal pathology and description of the symptoms.   Disposition:  I have considered need for hospitalization, however, considering all of the above, I believe this patient is stable for discharge at this time.  Patient/family educated about specific return precautions for given  chief complaint and symptoms.  Patient/family educated about follow-up with PCP.     Patient/family expressed understanding of return precautions and need for follow-up. Patient spoken to regarding all imaging and laboratory results and appropriate follow up for these results. All education provided in verbal form with additional information in written form. Time was allowed for answering of patient questions. Patient discharged.    Emergency Department Medication Summary:   Medications  lactated ringers bolus 1,000 mL (0 mLs Intravenous Stopped 08/10/22 0107)         Clinical Impression:  1. Dizziness      Discharge   Final Clinical Impression(s) / ED Diagnoses Final diagnoses:  Dizziness    Rx / DC Orders ED Discharge Orders          Ordered    meclizine (ANTIVERT) 25 MG tablet  2 times daily PRN        08/09/22 2348              Glyn Ade, MD 08/09/22 2348  Repeat signing on 08/14/2022 due to accidental clicking of the "cosign required" box    Glyn Ade, MD 08/14/22  1450  

## 2022-08-16 DIAGNOSIS — R2681 Unsteadiness on feet: Secondary | ICD-10-CM | POA: Diagnosis not present

## 2022-08-16 DIAGNOSIS — Z6831 Body mass index (BMI) 31.0-31.9, adult: Secondary | ICD-10-CM | POA: Diagnosis not present

## 2022-08-16 DIAGNOSIS — E1140 Type 2 diabetes mellitus with diabetic neuropathy, unspecified: Secondary | ICD-10-CM | POA: Diagnosis not present

## 2022-08-16 DIAGNOSIS — N183 Chronic kidney disease, stage 3 unspecified: Secondary | ICD-10-CM | POA: Diagnosis not present

## 2022-08-16 DIAGNOSIS — R55 Syncope and collapse: Secondary | ICD-10-CM | POA: Diagnosis not present

## 2022-08-16 DIAGNOSIS — R270 Ataxia, unspecified: Secondary | ICD-10-CM | POA: Diagnosis not present

## 2022-08-16 DIAGNOSIS — R42 Dizziness and giddiness: Secondary | ICD-10-CM | POA: Diagnosis not present

## 2022-08-21 DIAGNOSIS — R2681 Unsteadiness on feet: Secondary | ICD-10-CM | POA: Diagnosis not present

## 2022-08-21 DIAGNOSIS — R270 Ataxia, unspecified: Secondary | ICD-10-CM | POA: Diagnosis not present

## 2022-08-24 DIAGNOSIS — R270 Ataxia, unspecified: Secondary | ICD-10-CM | POA: Diagnosis not present

## 2022-08-24 DIAGNOSIS — R2681 Unsteadiness on feet: Secondary | ICD-10-CM | POA: Diagnosis not present

## 2022-08-25 DIAGNOSIS — R319 Hematuria, unspecified: Secondary | ICD-10-CM | POA: Diagnosis not present

## 2022-08-25 DIAGNOSIS — R310 Gross hematuria: Secondary | ICD-10-CM | POA: Diagnosis not present

## 2022-08-25 DIAGNOSIS — N323 Diverticulum of bladder: Secondary | ICD-10-CM | POA: Diagnosis not present

## 2022-08-25 DIAGNOSIS — N281 Cyst of kidney, acquired: Secondary | ICD-10-CM | POA: Diagnosis not present

## 2022-08-25 DIAGNOSIS — D1803 Hemangioma of intra-abdominal structures: Secondary | ICD-10-CM | POA: Diagnosis not present

## 2022-08-28 DIAGNOSIS — R2681 Unsteadiness on feet: Secondary | ICD-10-CM | POA: Diagnosis not present

## 2022-08-28 DIAGNOSIS — R270 Ataxia, unspecified: Secondary | ICD-10-CM | POA: Diagnosis not present

## 2022-08-31 ENCOUNTER — Ambulatory Visit
Admission: RE | Admit: 2022-08-31 | Discharge: 2022-08-31 | Disposition: A | Discharge status: Home / Self Care | Payer: Medicare Other | Source: Ambulatory Visit | Attending: Nurse Practitioner | Admitting: Nurse Practitioner

## 2022-08-31 ENCOUNTER — Other Ambulatory Visit: Payer: Self-pay | Admitting: Nurse Practitioner

## 2022-08-31 DIAGNOSIS — Z9181 History of falling: Secondary | ICD-10-CM | POA: Diagnosis not present

## 2022-08-31 DIAGNOSIS — M25552 Pain in left hip: Secondary | ICD-10-CM

## 2022-08-31 DIAGNOSIS — R270 Ataxia, unspecified: Secondary | ICD-10-CM | POA: Diagnosis not present

## 2022-08-31 DIAGNOSIS — E7800 Pure hypercholesterolemia, unspecified: Secondary | ICD-10-CM | POA: Diagnosis not present

## 2022-08-31 DIAGNOSIS — R2681 Unsteadiness on feet: Secondary | ICD-10-CM | POA: Diagnosis not present

## 2022-08-31 DIAGNOSIS — E1165 Type 2 diabetes mellitus with hyperglycemia: Secondary | ICD-10-CM | POA: Diagnosis not present

## 2022-08-31 DIAGNOSIS — M545 Low back pain, unspecified: Secondary | ICD-10-CM

## 2022-08-31 DIAGNOSIS — M5116 Intervertebral disc disorders with radiculopathy, lumbar region: Secondary | ICD-10-CM | POA: Diagnosis not present

## 2022-09-05 DIAGNOSIS — R2681 Unsteadiness on feet: Secondary | ICD-10-CM | POA: Diagnosis not present

## 2022-09-05 DIAGNOSIS — R270 Ataxia, unspecified: Secondary | ICD-10-CM | POA: Diagnosis not present

## 2022-09-07 DIAGNOSIS — R2681 Unsteadiness on feet: Secondary | ICD-10-CM | POA: Diagnosis not present

## 2022-09-07 DIAGNOSIS — I639 Cerebral infarction, unspecified: Secondary | ICD-10-CM | POA: Diagnosis not present

## 2022-09-07 DIAGNOSIS — C61 Malignant neoplasm of prostate: Secondary | ICD-10-CM | POA: Diagnosis not present

## 2022-09-07 DIAGNOSIS — G609 Hereditary and idiopathic neuropathy, unspecified: Secondary | ICD-10-CM | POA: Diagnosis not present

## 2022-09-07 DIAGNOSIS — E669 Obesity, unspecified: Secondary | ICD-10-CM | POA: Diagnosis not present

## 2022-09-07 DIAGNOSIS — E7800 Pure hypercholesterolemia, unspecified: Secondary | ICD-10-CM | POA: Diagnosis not present

## 2022-09-07 DIAGNOSIS — R270 Ataxia, unspecified: Secondary | ICD-10-CM | POA: Diagnosis not present

## 2022-09-07 DIAGNOSIS — E1165 Type 2 diabetes mellitus with hyperglycemia: Secondary | ICD-10-CM | POA: Diagnosis not present

## 2022-09-07 DIAGNOSIS — I10 Essential (primary) hypertension: Secondary | ICD-10-CM | POA: Diagnosis not present

## 2022-09-07 DIAGNOSIS — N189 Chronic kidney disease, unspecified: Secondary | ICD-10-CM | POA: Diagnosis not present

## 2022-09-12 DIAGNOSIS — R270 Ataxia, unspecified: Secondary | ICD-10-CM | POA: Diagnosis not present

## 2022-09-12 DIAGNOSIS — R2681 Unsteadiness on feet: Secondary | ICD-10-CM | POA: Diagnosis not present

## 2022-09-13 DIAGNOSIS — B353 Tinea pedis: Secondary | ICD-10-CM | POA: Diagnosis not present

## 2022-09-14 DIAGNOSIS — R2681 Unsteadiness on feet: Secondary | ICD-10-CM | POA: Diagnosis not present

## 2022-09-14 DIAGNOSIS — R270 Ataxia, unspecified: Secondary | ICD-10-CM | POA: Diagnosis not present

## 2022-09-19 DIAGNOSIS — R270 Ataxia, unspecified: Secondary | ICD-10-CM | POA: Diagnosis not present

## 2022-09-19 DIAGNOSIS — R2681 Unsteadiness on feet: Secondary | ICD-10-CM | POA: Diagnosis not present

## 2022-09-21 DIAGNOSIS — R2681 Unsteadiness on feet: Secondary | ICD-10-CM | POA: Diagnosis not present

## 2022-09-21 DIAGNOSIS — R270 Ataxia, unspecified: Secondary | ICD-10-CM | POA: Diagnosis not present

## 2022-09-26 DIAGNOSIS — R2681 Unsteadiness on feet: Secondary | ICD-10-CM | POA: Diagnosis not present

## 2022-09-26 DIAGNOSIS — R270 Ataxia, unspecified: Secondary | ICD-10-CM | POA: Diagnosis not present

## 2022-09-28 ENCOUNTER — Other Ambulatory Visit: Payer: Self-pay | Admitting: Nurse Practitioner

## 2022-09-28 ENCOUNTER — Other Ambulatory Visit: Payer: Self-pay

## 2022-09-28 DIAGNOSIS — M542 Cervicalgia: Secondary | ICD-10-CM | POA: Diagnosis not present

## 2022-09-28 DIAGNOSIS — M5116 Intervertebral disc disorders with radiculopathy, lumbar region: Secondary | ICD-10-CM | POA: Diagnosis not present

## 2022-09-28 DIAGNOSIS — M545 Low back pain, unspecified: Secondary | ICD-10-CM | POA: Diagnosis not present

## 2022-09-28 DIAGNOSIS — Z981 Arthrodesis status: Secondary | ICD-10-CM | POA: Diagnosis not present

## 2022-09-28 MED ORDER — HYDROCODONE-ACETAMINOPHEN 5-325 MG PO TABS
1.0000 | ORAL_TABLET | Freq: Every day | ORAL | 0 refills | Status: AC | PRN
Start: 2022-09-28 — End: 2022-10-12
  Filled 2022-09-28: qty 14, 14d supply, fill #0

## 2022-09-29 DIAGNOSIS — R270 Ataxia, unspecified: Secondary | ICD-10-CM | POA: Diagnosis not present

## 2022-09-29 DIAGNOSIS — R2681 Unsteadiness on feet: Secondary | ICD-10-CM | POA: Diagnosis not present

## 2022-10-02 DIAGNOSIS — R310 Gross hematuria: Secondary | ICD-10-CM | POA: Diagnosis not present

## 2022-10-02 DIAGNOSIS — Z8546 Personal history of malignant neoplasm of prostate: Secondary | ICD-10-CM | POA: Diagnosis not present

## 2022-10-03 DIAGNOSIS — N183 Chronic kidney disease, stage 3 unspecified: Secondary | ICD-10-CM | POA: Diagnosis not present

## 2022-10-03 DIAGNOSIS — K219 Gastro-esophageal reflux disease without esophagitis: Secondary | ICD-10-CM | POA: Diagnosis not present

## 2022-10-03 DIAGNOSIS — E782 Mixed hyperlipidemia: Secondary | ICD-10-CM | POA: Diagnosis not present

## 2022-10-03 DIAGNOSIS — I10 Essential (primary) hypertension: Secondary | ICD-10-CM | POA: Diagnosis not present

## 2022-10-03 DIAGNOSIS — E1140 Type 2 diabetes mellitus with diabetic neuropathy, unspecified: Secondary | ICD-10-CM | POA: Diagnosis not present

## 2022-10-03 DIAGNOSIS — I69354 Hemiplegia and hemiparesis following cerebral infarction affecting left non-dominant side: Secondary | ICD-10-CM | POA: Diagnosis not present

## 2022-10-03 DIAGNOSIS — Z6831 Body mass index (BMI) 31.0-31.9, adult: Secondary | ICD-10-CM | POA: Diagnosis not present

## 2022-10-03 DIAGNOSIS — Z23 Encounter for immunization: Secondary | ICD-10-CM | POA: Diagnosis not present

## 2022-10-03 DIAGNOSIS — Z0000 Encounter for general adult medical examination without abnormal findings: Secondary | ICD-10-CM | POA: Diagnosis not present

## 2022-10-07 ENCOUNTER — Other Ambulatory Visit: Payer: Self-pay | Admitting: Interventional Cardiology

## 2022-10-09 DIAGNOSIS — L603 Nail dystrophy: Secondary | ICD-10-CM | POA: Diagnosis not present

## 2022-10-09 DIAGNOSIS — E1142 Type 2 diabetes mellitus with diabetic polyneuropathy: Secondary | ICD-10-CM | POA: Diagnosis not present

## 2022-10-09 DIAGNOSIS — E1151 Type 2 diabetes mellitus with diabetic peripheral angiopathy without gangrene: Secondary | ICD-10-CM | POA: Diagnosis not present

## 2022-10-09 DIAGNOSIS — I739 Peripheral vascular disease, unspecified: Secondary | ICD-10-CM | POA: Diagnosis not present

## 2022-10-11 DIAGNOSIS — R2681 Unsteadiness on feet: Secondary | ICD-10-CM | POA: Diagnosis not present

## 2022-10-11 DIAGNOSIS — R270 Ataxia, unspecified: Secondary | ICD-10-CM | POA: Diagnosis not present

## 2022-10-13 DIAGNOSIS — R270 Ataxia, unspecified: Secondary | ICD-10-CM | POA: Diagnosis not present

## 2022-10-13 DIAGNOSIS — R2681 Unsteadiness on feet: Secondary | ICD-10-CM | POA: Diagnosis not present

## 2022-10-19 DIAGNOSIS — R2681 Unsteadiness on feet: Secondary | ICD-10-CM | POA: Diagnosis not present

## 2022-10-19 DIAGNOSIS — R270 Ataxia, unspecified: Secondary | ICD-10-CM | POA: Diagnosis not present

## 2022-10-23 ENCOUNTER — Encounter (HOSPITAL_BASED_OUTPATIENT_CLINIC_OR_DEPARTMENT_OTHER): Payer: Self-pay | Admitting: Emergency Medicine

## 2022-10-23 ENCOUNTER — Other Ambulatory Visit: Payer: Self-pay

## 2022-10-23 ENCOUNTER — Emergency Department (HOSPITAL_BASED_OUTPATIENT_CLINIC_OR_DEPARTMENT_OTHER)
Admission: EM | Admit: 2022-10-23 | Discharge: 2022-10-23 | Disposition: A | Discharge status: Home / Self Care | Payer: Medicare Other | Attending: Emergency Medicine | Admitting: Emergency Medicine

## 2022-10-23 DIAGNOSIS — H938X3 Other specified disorders of ear, bilateral: Secondary | ICD-10-CM | POA: Insufficient documentation

## 2022-10-23 DIAGNOSIS — M5432 Sciatica, left side: Secondary | ICD-10-CM | POA: Diagnosis not present

## 2022-10-23 DIAGNOSIS — M25512 Pain in left shoulder: Secondary | ICD-10-CM | POA: Diagnosis present

## 2022-10-23 DIAGNOSIS — R051 Acute cough: Secondary | ICD-10-CM | POA: Insufficient documentation

## 2022-10-23 DIAGNOSIS — I509 Heart failure, unspecified: Secondary | ICD-10-CM | POA: Diagnosis not present

## 2022-10-23 DIAGNOSIS — M25511 Pain in right shoulder: Secondary | ICD-10-CM | POA: Insufficient documentation

## 2022-10-23 DIAGNOSIS — M5441 Lumbago with sciatica, right side: Secondary | ICD-10-CM | POA: Diagnosis not present

## 2022-10-23 DIAGNOSIS — Z7901 Long term (current) use of anticoagulants: Secondary | ICD-10-CM | POA: Insufficient documentation

## 2022-10-23 DIAGNOSIS — Z794 Long term (current) use of insulin: Secondary | ICD-10-CM | POA: Diagnosis not present

## 2022-10-23 DIAGNOSIS — E119 Type 2 diabetes mellitus without complications: Secondary | ICD-10-CM | POA: Diagnosis not present

## 2022-10-23 MED ORDER — CETIRIZINE HCL 5 MG PO TABS: 10.0000 mg | ORAL_TABLET | Freq: Every day | ORAL | 0 refills | Status: DC

## 2022-10-23 MED ORDER — PREDNISONE 20 MG PO TABS: 20.0000 mg | ORAL_TABLET | Freq: Two times a day (BID) | ORAL | 0 refills | Status: AC

## 2022-10-23 MED ORDER — DEXAMETHASONE SODIUM PHOSPHATE 10 MG/ML IJ SOLN: 8.0000 mg | Freq: Once | INTRAMUSCULAR | Status: DC

## 2022-10-23 MED ORDER — DEXAMETHASONE SODIUM PHOSPHATE 10 MG/ML IJ SOLN
5.0000 mg | Freq: Once | INTRAMUSCULAR | Status: AC
  Administered 2022-10-23: 5 mg via INTRAMUSCULAR
  Filled 2022-10-23: qty 1

## 2022-10-23 NOTE — ED Provider Notes (Cosign Needed Addendum)
[place] [facility] Provider Note   CSN: [id number] Arrival date & time: 10/23/22  1211     History  Chief Complaint  Patient presents with   Shoulder Pain    left    Steve Brown is a 82 y.o. male.  History of diabetes, CHF, sciatica, left shoulder pain presents to ER for numerous complaints first complaining of left shoulder pain that radiates into the left arm that is chronic in nature, states he intermittently gets steroid injections in the shoulder that usually help.  He states he had recently gotten intramuscular steroids for this as well as his left-sided sciatica only lasted for couple days and the pain is returned.  Also having same left-sided sciatica.  No saddle history or paresthesia, no bowel or bladder incontinence, no fever or chills, no injury to the shoulder or back or leg.  He is able to ambulate, no weakness in his extremities.  Complaining of dry cough x 1 week.  No fevers, no shortness of breath, no chest pain also having itchy watery eyes and stuffy nose, no sick contacts.   Shoulder Pain      Home Medications Prior to Admission medications   Medication Sig Start Date End Date Taking? Authorizing Provider  cetirizine (ZYRTEC) 5 MG tablet Take 2 tablets (10 mg total) by mouth daily. 10/23/22  Yes Glorine Hanratty A, PA-C  predniSONE (DELTASONE) 20 MG tablet Take 1 tablet (20 mg total) by mouth 2 (two) times daily with a meal for 3 days. 10/23/22 10/26/22 Yes Shailyn Weyandt A, PA-C  busPIRone (BUSPAR) 5 MG tablet Take 5 mg by mouth daily.    [provider]  Cholecalciferol (VITAMIN D-1000 MAX ST) 25 MCG (1000 UT) tablet Take 1,000 Units by mouth daily.    [provider]  diclofenac Sodium (VOLTAREN) 1 % GEL Apply 4 g topically 4 (four) times daily. Patient taking differently: Apply 4 g topically 2 (two) times daily as needed (joint pain). 09/03/19   Melene Plan, DO  escitalopram (LEXAPRO) 20 MG tablet  Take 20 mg by mouth at bedtime.    [provider]  furosemide (LASIX) 20 MG tablet Take 1-2 tablets (20-40 mg total) by mouth daily as needed for fluid. Patient taking differently: Take 20 mg by mouth daily as needed for fluid. 06/13/20   Medina-Vargas, Monina C, NP  hydrOXYzine (VISTARIL) 25 MG capsule Take 25 mg by mouth every 8 (eight) hours as needed for anxiety.    [provider]  insulin aspart (NOVOLOG) 100 UNIT/ML FlexPen Inject 5 Units into the skin 3 (three) times daily with meals. Patient taking differently: Inject 5-9 Units into the skin 3 (three) times daily with meals. 06/13/20   Medina-Vargas, Monina C, NP  ipratropium (ATROVENT) 0.06 % nasal spray Place 2 sprays into both nostrils 3 (three) times daily.    [provider]  ketoconazole (NIZORAL) 2 % cream Apply 1 Application topically 2 (two) times daily.    [provider]  losartan (COZAAR) 25 MG tablet Take 25 mg by mouth daily.    [provider]  meclizine (ANTIVERT) 25 MG tablet Take 1 tablet (25 mg total) by mouth 2 (two) times daily as needed for dizziness. 08/09/22   Glyn Ade, MD  nitroGLYCERIN (NITROSTAT) 0.4 MG SL tablet Place 0.4 mg under the tongue every 5 (five) minutes as needed for chest pain.    [provider]  nystatin (MYCOSTATIN/NYSTOP) powder Apply 1 Application topically as needed (  irritation).    [provider]  nystatin cream (MYCOSTATIN) Apply 1 Application topically as needed for rash. 06/15/21   [provider]  pregabalin (LYRICA) 300 MG capsule Take 300 mg by mouth 2 (two) times daily. 04/26/20   [provider]  rosuvastatin (CRESTOR) 20 MG tablet Take 1 tablet (20 mg total) by mouth daily. Please call office to schedule an appt for further refills. Thank you 10/09/22   Corky Crafts, MD  TRESIBA FLEXTOUCH 100 UNIT/ML FlexTouch Pen Inject 34 Units into the skin daily. Patient taking differently: Inject 36 Units  into the skin daily. 06/13/20   Medina-Vargas, Monina C, NP  XARELTO 20 MG TABS tablet Take 1 tablet (20 mg total) by mouth daily. 06/13/20   Medina-Vargas, Monina C, NP      Allergies    Other, Phenergan [promethazine], Iohexol, Phenergan [promethazine], and Tramadol    Review of Systems   Review of Systems  Physical Exam Updated Vital Signs BP (!) 150/72 (BP Location: Right Arm)   Pulse 60   Temp 98.5 F (36.9 C) (Oral)   Resp 18   Wt 83.5 kg   SpO2 99%   BMI 30.62 kg/m  Physical Exam Vitals and nursing note reviewed.  Constitutional:      General: He is not in acute distress.    Appearance: He is well-developed.  HENT:     Head: Normocephalic and atraumatic.     Right Ear: A middle ear effusion is present. Tympanic membrane is not erythematous, retracted or bulging.     Left Ear: A middle ear effusion is present. Tympanic membrane is not erythematous, retracted or bulging.  Eyes:     Conjunctiva/sclera: Conjunctivae normal.  Cardiovascular:     Rate and Rhythm: Normal rate and regular rhythm.     Heart sounds: No murmur heard. Pulmonary:     Effort: Pulmonary effort is normal. No respiratory distress.     Breath sounds: Normal breath sounds.  Abdominal:     Palpations: Abdomen is soft.     Tenderness: There is no abdominal tenderness.  Musculoskeletal:        General: No swelling.     Cervical back: Neck supple.     Right lower leg: No edema.     Left lower leg: No edema.     Comments: Mild tenderness left trapezius area.  Strength bilateral lower extremities, normal sensation bilateral upper extremities.  Mild mild left buttock tenderness and positive left straight leg raise but normal gait.  Normal strength bilateral lower extremities and normal sensation bilateral lower extremities.  No overlying skin changes noted on musculoskeletal exam of left, left leg and  Skin:    General: Skin is warm and dry.     Capillary Refill: Capillary refill takes less than 2  seconds.  Neurological:     General: No focal deficit present.     Mental Status: He is alert and oriented to person, place, and time.     Sensory: No sensory deficit.     Motor: No weakness.     Gait: Gait normal.  Psychiatric:        Mood and Affect: Mood normal.     ED Results / Procedures / Treatments   Labs (all labs ordered are listed, but only abnormal results are displayed) Labs Reviewed - No data to display  EKG None  Radiology No results found.  Procedures Procedures    Medications Ordered in ED Medications  dexamethasone (DECADRON) injection 5  mg (5 mg Intramuscular Given 10/23/22 1505)    ED Course/ Medical Decision Making/ A&P                                 Medical Decision Making This patient presents to the ED for concern of complaints including left shoulder and arm pain that is chronic, left-sided sciatica that is chronic and dry cough x 1 week, this involves an extensive number of treatment options, and is a complaint that carries with it a high risk of complications and morbidity.  The differential diagnosis includes HNP, muscle strain, spasm, contusion, cord compression, pneumonia, bronchitis, URI, other   Co morbidities that complicate the patient evaluation  Diabetes   Additional history obtained:  Additional history obtained from EMR External records from outside source obtained and reviewed including notes  Problem List / ED Course / Critical interventions / Medication management  Presents with multiple complaints 1.  Shoulder and arm pain-patient states he follows up with specialist outpatient and gets cortisone injections, feels a headache similar, no chest pain sweating dizziness or exertional pain to suggest is an anginal equivalent.  Normal range of motion no joint swelling suggest septic arthritis.  States in the past vascular steroid injection of helped but only lasted for couple of days.  He is requesting IM injection again today  but I discussed we can send him home with several days of steroids at home.  He is diabetic and we discussed the risk of hyperglycemia.  Patient states he is able to check his sugars and use a sliding scale insulin to control his elevated blood sugar due to steroids as he has had to do this in the past. 2: Left-sided sciatica-patient with history of sciatica states this is the same, no synesthesia, no paresthesias or bowel or bladder incontinence he is got normal strength in bilateral upper and lower extremities, normal sensation normal gait.  Again states IM steroid in the past helped but only lasted for about 2 days.  Treatment as above for the left shoulder and arm pain with the steroids.  Patient PCP follow-up.  No red flag symptoms suggesting need for workup with imaging or labs 3: Dry cough x 1 week, also having some watery eyes, sinus congestion normal vitals, normal auscultation on exam with no respiratory distress noted.  Discussed with patient feel this is likely due to seasonal allergies we will start him on antihistamines and advised on PCP follow-up.  I reviewed patient's prior labs he does have some baseline renal dysfunctions we will give lower dose of Zyrtec due to this.  I have reviewed the patients home medicines and have made adjustments as needed      Risk OTC drugs. Prescription drug management.           Final Clinical Impression(s) / ED Diagnoses Final diagnoses:  Acute pain of right shoulder  Sciatica, left side  Acute cough    Rx / DC Orders ED Discharge Orders          Ordered    predniSONE (DELTASONE) 20 MG tablet  2 times daily with meals        10/23/22 1442    cetirizine (ZYRTEC) 5 MG tablet  Daily        10/23/22 1445              Ma Rings, PA-C 10/23/22 1445    Ma Rings, PA-C 10/23/22  2258    Virgina Norfolk, DO 10/24/22 1610

## 2022-10-23 NOTE — Discharge Instructions (Addendum)
You are seen today for other complaints, having pain left shoulder and left-sided sciatica.  We are giving you steroids for this.  You can take over-the-counter Tylenol as well.  As discussed the steroids can increase your blood sugar so it is important to watch these closely and adjust your insulin based on your sliding scale.  Is important for you to follow-up closely with your primary care doctor.  We also starting you on allergy medicine to help with your cough and watery eyes and congestion.  Come back to the ER if you have any new or worsening symptoms.

## 2022-10-23 NOTE — ED Triage Notes (Signed)
Left shoulder and arm pain , injury 4 months ago .  Adds sciatica to left leg .

## 2022-10-24 DIAGNOSIS — R270 Ataxia, unspecified: Secondary | ICD-10-CM | POA: Diagnosis not present

## 2022-10-24 DIAGNOSIS — M25511 Pain in right shoulder: Secondary | ICD-10-CM | POA: Diagnosis not present

## 2022-10-24 DIAGNOSIS — R2681 Unsteadiness on feet: Secondary | ICD-10-CM | POA: Diagnosis not present

## 2022-10-26 ENCOUNTER — Other Ambulatory Visit: Payer: Self-pay

## 2022-10-26 DIAGNOSIS — M5116 Intervertebral disc disorders with radiculopathy, lumbar region: Secondary | ICD-10-CM | POA: Diagnosis not present

## 2022-10-26 DIAGNOSIS — M542 Cervicalgia: Secondary | ICD-10-CM | POA: Diagnosis not present

## 2022-10-26 DIAGNOSIS — Z981 Arthrodesis status: Secondary | ICD-10-CM | POA: Diagnosis not present

## 2022-10-26 DIAGNOSIS — M545 Low back pain, unspecified: Secondary | ICD-10-CM | POA: Diagnosis not present

## 2022-10-26 MED ORDER — HYDROCODONE-ACETAMINOPHEN 5-325 MG PO TABS
1.0000 | ORAL_TABLET | Freq: Every day | ORAL | 0 refills | Status: DC
  Filled 2022-10-26: qty 14, 14d supply, fill #0

## 2022-10-27 ENCOUNTER — Ambulatory Visit
Admission: RE | Admit: 2022-10-27 | Discharge: 2022-10-27 | Disposition: A | Discharge status: Home / Self Care | Payer: Medicare Other | Source: Ambulatory Visit | Attending: Nurse Practitioner | Admitting: Nurse Practitioner

## 2022-10-27 DIAGNOSIS — M47816 Spondylosis without myelopathy or radiculopathy, lumbar region: Secondary | ICD-10-CM | POA: Diagnosis not present

## 2022-10-27 DIAGNOSIS — M5116 Intervertebral disc disorders with radiculopathy, lumbar region: Secondary | ICD-10-CM

## 2022-10-27 DIAGNOSIS — Z8546 Personal history of malignant neoplasm of prostate: Secondary | ICD-10-CM | POA: Diagnosis not present

## 2022-10-27 DIAGNOSIS — M5126 Other intervertebral disc displacement, lumbar region: Secondary | ICD-10-CM | POA: Diagnosis not present

## 2022-10-27 DIAGNOSIS — M48061 Spinal stenosis, lumbar region without neurogenic claudication: Secondary | ICD-10-CM | POA: Diagnosis not present

## 2022-10-31 DIAGNOSIS — R270 Ataxia, unspecified: Secondary | ICD-10-CM | POA: Diagnosis not present

## 2022-10-31 DIAGNOSIS — R2681 Unsteadiness on feet: Secondary | ICD-10-CM | POA: Diagnosis not present

## 2022-11-02 DIAGNOSIS — R270 Ataxia, unspecified: Secondary | ICD-10-CM | POA: Diagnosis not present

## 2022-11-02 DIAGNOSIS — R2681 Unsteadiness on feet: Secondary | ICD-10-CM | POA: Diagnosis not present

## 2022-11-06 DIAGNOSIS — R310 Gross hematuria: Secondary | ICD-10-CM | POA: Diagnosis not present

## 2022-11-06 DIAGNOSIS — Z8546 Personal history of malignant neoplasm of prostate: Secondary | ICD-10-CM | POA: Diagnosis not present

## 2022-11-06 DIAGNOSIS — N390 Urinary tract infection, site not specified: Secondary | ICD-10-CM | POA: Diagnosis not present

## 2022-11-07 DIAGNOSIS — R270 Ataxia, unspecified: Secondary | ICD-10-CM | POA: Diagnosis not present

## 2022-11-07 DIAGNOSIS — R2681 Unsteadiness on feet: Secondary | ICD-10-CM | POA: Diagnosis not present

## 2022-11-09 DIAGNOSIS — R270 Ataxia, unspecified: Secondary | ICD-10-CM | POA: Diagnosis not present

## 2022-11-09 DIAGNOSIS — R2681 Unsteadiness on feet: Secondary | ICD-10-CM | POA: Diagnosis not present

## 2022-11-14 DIAGNOSIS — M25812 Other specified joint disorders, left shoulder: Secondary | ICD-10-CM | POA: Diagnosis not present

## 2022-11-20 DIAGNOSIS — R270 Ataxia, unspecified: Secondary | ICD-10-CM | POA: Diagnosis not present

## 2022-11-20 DIAGNOSIS — R2681 Unsteadiness on feet: Secondary | ICD-10-CM | POA: Diagnosis not present

## 2022-11-22 DIAGNOSIS — R2681 Unsteadiness on feet: Secondary | ICD-10-CM | POA: Diagnosis not present

## 2022-11-22 DIAGNOSIS — R270 Ataxia, unspecified: Secondary | ICD-10-CM | POA: Diagnosis not present

## 2022-11-23 DIAGNOSIS — E669 Obesity, unspecified: Secondary | ICD-10-CM | POA: Diagnosis not present

## 2022-11-23 DIAGNOSIS — I10 Essential (primary) hypertension: Secondary | ICD-10-CM | POA: Diagnosis not present

## 2022-11-23 DIAGNOSIS — E1165 Type 2 diabetes mellitus with hyperglycemia: Secondary | ICD-10-CM | POA: Diagnosis not present

## 2022-11-23 DIAGNOSIS — G609 Hereditary and idiopathic neuropathy, unspecified: Secondary | ICD-10-CM | POA: Diagnosis not present

## 2022-11-23 DIAGNOSIS — E7800 Pure hypercholesterolemia, unspecified: Secondary | ICD-10-CM | POA: Diagnosis not present

## 2022-11-28 ENCOUNTER — Ambulatory Visit: Payer: Medicare Other | Admitting: Adult Health

## 2022-11-28 DIAGNOSIS — Z79891 Long term (current) use of opiate analgesic: Secondary | ICD-10-CM | POA: Diagnosis not present

## 2022-11-28 DIAGNOSIS — M47816 Spondylosis without myelopathy or radiculopathy, lumbar region: Secondary | ICD-10-CM | POA: Diagnosis not present

## 2022-11-28 DIAGNOSIS — Z981 Arthrodesis status: Secondary | ICD-10-CM | POA: Diagnosis not present

## 2022-11-28 DIAGNOSIS — M542 Cervicalgia: Secondary | ICD-10-CM | POA: Diagnosis not present

## 2022-11-28 DIAGNOSIS — M545 Low back pain, unspecified: Secondary | ICD-10-CM | POA: Diagnosis not present

## 2022-11-28 DIAGNOSIS — G8929 Other chronic pain: Secondary | ICD-10-CM | POA: Diagnosis not present

## 2022-11-28 DIAGNOSIS — Z029 Encounter for administrative examinations, unspecified: Secondary | ICD-10-CM | POA: Diagnosis not present

## 2022-11-29 ENCOUNTER — Other Ambulatory Visit: Payer: Self-pay

## 2022-11-29 MED ORDER — HYDROCODONE-ACETAMINOPHEN 5-325 MG PO TABS
1.0000 | ORAL_TABLET | Freq: Every day | ORAL | 0 refills | Status: DC
Start: 2022-11-29 — End: 2023-09-29
  Filled 2022-11-29: qty 30, 30d supply, fill #0

## 2022-11-30 DIAGNOSIS — R270 Ataxia, unspecified: Secondary | ICD-10-CM | POA: Diagnosis not present

## 2022-11-30 DIAGNOSIS — R2681 Unsteadiness on feet: Secondary | ICD-10-CM | POA: Diagnosis not present

## 2022-12-01 ENCOUNTER — Other Ambulatory Visit: Payer: Self-pay

## 2022-12-02 ENCOUNTER — Encounter (HOSPITAL_BASED_OUTPATIENT_CLINIC_OR_DEPARTMENT_OTHER): Payer: Self-pay | Admitting: Emergency Medicine

## 2022-12-02 ENCOUNTER — Emergency Department (HOSPITAL_BASED_OUTPATIENT_CLINIC_OR_DEPARTMENT_OTHER)
Admission: EM | Admit: 2022-12-02 | Discharge: 2022-12-03 | Disposition: A | Discharge status: Home / Self Care | Payer: Medicare Other | Attending: Emergency Medicine | Admitting: Emergency Medicine

## 2022-12-02 ENCOUNTER — Emergency Department (HOSPITAL_BASED_OUTPATIENT_CLINIC_OR_DEPARTMENT_OTHER): Payer: Medicare Other

## 2022-12-02 DIAGNOSIS — R42 Dizziness and giddiness: Secondary | ICD-10-CM | POA: Diagnosis not present

## 2022-12-02 DIAGNOSIS — Z471 Aftercare following joint replacement surgery: Secondary | ICD-10-CM | POA: Diagnosis not present

## 2022-12-02 DIAGNOSIS — R29810 Facial weakness: Secondary | ICD-10-CM | POA: Diagnosis not present

## 2022-12-02 DIAGNOSIS — I440 Atrioventricular block, first degree: Secondary | ICD-10-CM | POA: Diagnosis not present

## 2022-12-02 DIAGNOSIS — Z794 Long term (current) use of insulin: Secondary | ICD-10-CM | POA: Insufficient documentation

## 2022-12-02 DIAGNOSIS — Z7901 Long term (current) use of anticoagulants: Secondary | ICD-10-CM | POA: Diagnosis not present

## 2022-12-02 DIAGNOSIS — R29818 Other symptoms and signs involving the nervous system: Secondary | ICD-10-CM | POA: Diagnosis not present

## 2022-12-02 DIAGNOSIS — Z8673 Personal history of transient ischemic attack (TIA), and cerebral infarction without residual deficits: Secondary | ICD-10-CM | POA: Diagnosis not present

## 2022-12-02 DIAGNOSIS — I69351 Hemiplegia and hemiparesis following cerebral infarction affecting right dominant side: Secondary | ICD-10-CM | POA: Diagnosis not present

## 2022-12-02 DIAGNOSIS — H538 Other visual disturbances: Secondary | ICD-10-CM | POA: Diagnosis not present

## 2022-12-02 DIAGNOSIS — I10 Essential (primary) hypertension: Secondary | ICD-10-CM | POA: Diagnosis not present

## 2022-12-02 DIAGNOSIS — I4430 Unspecified atrioventricular block: Secondary | ICD-10-CM | POA: Diagnosis not present

## 2022-12-02 LAB — COMPREHENSIVE METABOLIC PANEL
ALT: 19 U/L (ref 0–44)
AST: 23 U/L (ref 15–41)
Albumin: 3.7 g/dL (ref 3.5–5.0)
Alkaline Phosphatase: 71 U/L (ref 38–126)
Anion gap: 5 (ref 5–15)
BUN: 18 mg/dL (ref 8–23)
CO2: 30 mmol/L (ref 22–32)
Calcium: 8.8 mg/dL — ABNORMAL LOW (ref 8.9–10.3)
Chloride: 104 mmol/L (ref 98–111)
Creatinine, Ser: 1.36 mg/dL — ABNORMAL HIGH (ref 0.61–1.24)
GFR, Estimated: 53 mL/min — ABNORMAL LOW (ref >60)
Glucose, Bld: 123 mg/dL — ABNORMAL HIGH (ref 70–99)
Potassium: 4.6 mmol/L (ref 3.5–5.1)
Sodium: 139 mmol/L (ref 135–145)
Total Bilirubin: 1 mg/dL (ref <1.2)
Total Protein: 6.9 g/dL (ref 6.5–8.1)

## 2022-12-02 LAB — CBC WITH DIFFERENTIAL/PLATELET
Abs Immature Granulocytes: 0.01 10*3/uL (ref 0.00–0.07)
Basophils Absolute: 0 10*3/uL (ref 0.0–0.1)
Basophils Relative: 1 %
Eosinophils Absolute: 0 10*3/uL (ref 0.0–0.5)
Eosinophils Relative: 0 %
HCT: 38.6 % — ABNORMAL LOW (ref 39.0–52.0)
Hemoglobin: 13.2 g/dL (ref 13.0–17.0)
Immature Granulocytes: 0 %
Lymphocytes Relative: 21 %
Lymphs Abs: 1.4 10*3/uL (ref 0.7–4.0)
MCH: 31.4 pg (ref 26.0–34.0)
MCHC: 34.2 g/dL (ref 30.0–36.0)
MCV: 91.9 fL (ref 80.0–100.0)
Monocytes Absolute: 0.7 10*3/uL (ref 0.1–1.0)
Monocytes Relative: 10 %
Neutro Abs: 4.5 10*3/uL (ref 1.7–7.7)
Neutrophils Relative %: 68 %
Platelets: 176 10*3/uL (ref 150–400)
RBC: 4.2 MIL/uL — ABNORMAL LOW (ref 4.22–5.81)
RDW: 12.6 % (ref 11.5–15.5)
WBC: 6.6 10*3/uL (ref 4.0–10.5)
nRBC: 0 % (ref 0.0–0.2)

## 2022-12-02 LAB — TROPONIN I (HIGH SENSITIVITY)
Troponin I (High Sensitivity): 27 ng/L — ABNORMAL HIGH (ref <18)
Troponin I (High Sensitivity): 25 ng/L — ABNORMAL HIGH (ref <18)

## 2022-12-02 MED ORDER — LORAZEPAM 1 MG PO TABS
0.5000 mg | ORAL_TABLET | Freq: Once | ORAL | Status: AC
  Administered 2022-12-03: 0.5 mg via ORAL
  Filled 2022-12-02 (×2): qty 1

## 2022-12-02 MED ORDER — MECLIZINE HCL 25 MG PO TABS
25.0000 mg | ORAL_TABLET | Freq: Once | ORAL | Status: AC
  Administered 2022-12-02: 25 mg via ORAL
  Filled 2022-12-02: qty 1

## 2022-12-02 NOTE — ED Notes (Signed)
Arrived to hallway bed 20 via [facility]. Awaiting MRI.

## 2022-12-02 NOTE — ED Notes (Signed)
ED Provider at bedside. 

## 2022-12-02 NOTE — ED Provider Notes (Signed)
Patient seen after arrival to [facility].  He is comfortable.  He understands plan of care including pending MRI brain.   Wynetta Fines, MD 12/02/22 [phone]

## 2022-12-02 NOTE — ED Provider Notes (Signed)
[place] [facility] Provider Note   CSN: [id number] Arrival date & time: 12/02/22  1430     History  Chief Complaint  Patient presents with   Dizziness    Steve Brown is a 82 y.o. male.  The history is provided by the patient.  Dizziness Quality:  Room spinning and imbalance Severity:  Mild Onset quality:  Gradual Duration:  8 hours Timing:  Constant Progression:  Unchanged Chronicity:  New Context: head movement   Relieved by:  Nothing Worsened by:  Nothing Associated symptoms: vision changes (blurred visisom) and weakness (right arm and leg slightly?)   Associated symptoms: no blood in stool, no chest pain, no diarrhea, no headaches, no hearing loss, no nausea, no palpitations, no shortness of breath, no syncope, no tinnitus and no vomiting   Risk factors: hx of stroke and hx of vertigo        Home Medications Prior to Admission medications   Medication Sig Start Date End Date Taking? Authorizing Provider  busPIRone (BUSPAR) 5 MG tablet Take 5 mg by mouth daily.    [provider]  cetirizine (ZYRTEC) 5 MG tablet Take 2 tablets (10 mg total) by mouth daily. 10/23/22   Carmel Sacramento A, PA-C  Cholecalciferol (VITAMIN D-1000 MAX ST) 25 MCG (1000 UT) tablet Take 1,000 Units by mouth daily.    [provider]  diclofenac Sodium (VOLTAREN) 1 % GEL Apply 4 g topically 4 (four) times daily. Patient taking differently: Apply 4 g topically 2 (two) times daily as needed (joint pain). 09/03/19   Melene Plan, DO  escitalopram (LEXAPRO) 20 MG tablet Take 20 mg by mouth at bedtime.    [provider]  furosemide (LASIX) 20 MG tablet Take 1-2 tablets (20-40 mg total) by mouth daily as needed for fluid. Patient taking differently: Take 20 mg by mouth daily as needed for fluid. 06/13/20   Medina-Vargas, Monina C, NP  HYDROcodone-acetaminophen (NORCO/VICODIN) 5-325 MG tablet Take 1 tablet by mouth daily for severe  pain. 11/29/22     hydrOXYzine (VISTARIL) 25 MG capsule Take 25 mg by mouth every 8 (eight) hours as needed for anxiety.    [provider]  insulin aspart (NOVOLOG) 100 UNIT/ML FlexPen Inject 5 Units into the skin 3 (three) times daily with meals. Patient taking differently: Inject 5-9 Units into the skin 3 (three) times daily with meals. 06/13/20   Medina-Vargas, Monina C, NP  ipratropium (ATROVENT) 0.06 % nasal spray Place 2 sprays into both nostrils 3 (three) times daily.    [provider]  ketoconazole (NIZORAL) 2 % cream Apply 1 Application topically 2 (two) times daily.    [provider]  losartan (COZAAR) 25 MG tablet Take 25 mg by mouth daily.    [provider]  meclizine (ANTIVERT) 25 MG tablet Take 1 tablet (25 mg total) by mouth 2 (two) times daily as needed for dizziness. 08/09/22   Glyn Ade, MD  nitroGLYCERIN (NITROSTAT) 0.4 MG SL tablet Place 0.4 mg under the tongue every 5 (five) minutes as needed for chest pain.    [provider]  nystatin (MYCOSTATIN/NYSTOP) powder Apply 1 Application topically as needed (irritation).    [provider]  nystatin cream (MYCOSTATIN) Apply 1 Application topically as needed for rash. 06/15/21   [provider]  pregabalin (LYRICA) 300 MG capsule Take 300 mg by mouth 2 (two) times daily. 04/26/20   [provider]  rosuvastatin (CRESTOR) 20 MG tablet Take 1  tablet (20 mg total) by mouth daily. Please call office to schedule an appt for further refills. Thank you 10/09/22   Corky Crafts, MD  TRESIBA FLEXTOUCH 100 UNIT/ML FlexTouch Pen Inject 34 Units into the skin daily. Patient taking differently: Inject 36 Units into the skin daily. 06/13/20   Medina-Vargas, Monina C, NP  XARELTO 20 MG TABS tablet Take 1 tablet (20 mg total) by mouth daily. 06/13/20   Medina-Vargas, Monina C, NP      Allergies    Other, Phenergan [promethazine], Iohexol, Phenergan [promethazine], and  Tramadol    Review of Systems   Review of Systems  HENT:  Negative for hearing loss and tinnitus.   Respiratory:  Negative for shortness of breath.   Cardiovascular:  Negative for chest pain, palpitations and syncope.  Gastrointestinal:  Negative for blood in stool, diarrhea, nausea and vomiting.  Neurological:  Positive for dizziness and weakness (right arm and leg slightly?). Negative for headaches.    Physical Exam Updated Vital Signs BP (!) 149/75 (BP Location: Right Arm)   Pulse (!) 54   Temp 98.1 F (36.7 C) (Oral)   Resp 17   Ht 5\' 5"  (1.651 m)   Wt 83.5 kg   BMI 30.62 kg/m  Physical Exam Vitals and nursing note reviewed.  Constitutional:      General: He is not in acute distress.    Appearance: He is well-developed. He is not ill-appearing.  HENT:     Head: Normocephalic and atraumatic.     Nose: Nose normal.     Mouth/Throat:     Mouth: Mucous membranes are moist.  Eyes:     Extraocular Movements: Extraocular movements intact.     Conjunctiva/sclera: Conjunctivae normal.     Pupils: Pupils are equal, round, and reactive to light.  Cardiovascular:     Rate and Rhythm: Normal rate and regular rhythm.     Pulses: Normal pulses.     Heart sounds: Normal heart sounds. No murmur heard. Pulmonary:     Effort: Pulmonary effort is normal. No respiratory distress.     Breath sounds: Normal breath sounds.  Abdominal:     Palpations: Abdomen is soft.     Tenderness: There is no abdominal tenderness.  Musculoskeletal:        General: No swelling.     Cervical back: Normal range of motion and neck supple.  Skin:    General: Skin is warm and dry.     Capillary Refill: Capillary refill takes less than 2 seconds.  Neurological:     Mental Status: He is alert.     Cranial Nerves: No cranial nerve deficit.     Sensory: No sensory deficit.     Coordination: Coordination normal.     Gait: Gait normal.     Comments: Trace weakness to RUE/RLE when compared to left, normal  sensation, no aphasia or slurred speech, no neglect, no visual field loss, normal cranial nerves, no obvious nystagmus, no facial droop, able to ambulate   Psychiatric:        Mood and Affect: Mood normal.     ED Results / Procedures / Treatments   Labs (all labs ordered are listed, but only abnormal results are displayed) Labs Reviewed  CBC WITH DIFFERENTIAL/PLATELET - Abnormal; Notable for the following components:      Result Value   RBC 4.20 (*)    HCT 38.6 (*)    All other components within normal limits  COMPREHENSIVE METABOLIC PANEL - Abnormal;  Notable for the following components:   Glucose, Bld 123 (*)    Creatinine, Ser 1.36 (*)    Calcium 8.8 (*)    GFR, Estimated 53 (*)    All other components within normal limits  TROPONIN I (HIGH SENSITIVITY) - Abnormal; Notable for the following components:   Troponin I (High Sensitivity) 27 (*)    All other components within normal limits  TROPONIN I (HIGH SENSITIVITY)    EKG EKG Interpretation Date/Time:  Saturday December 02 2022 14:36:20 EST Ventricular Rate:  52 PR Interval:  191 QRS Duration:  95 QT Interval:  463 QTC Calculation: 431 R Axis:   -8  Text Interpretation: Sinus rhythm Low voltage, precordial leads Confirmed by Virgina Norfolk [id number]) on 12/02/2022 3:42:04 PM  Radiology CT Head Wo Contrast  Result Date: 12/02/2022 CLINICAL DATA:  Neuro deficit, acute, stroke suspected. Awoke with dizziness and weakness of the right face and arm EXAM: CT HEAD WITHOUT CONTRAST TECHNIQUE: Contiguous axial images were obtained from the base of the skull through the vertex without intravenous contrast. RADIATION DOSE REDUCTION: This exam was performed according to the departmental dose-optimization program which includes automated exposure control, adjustment of the mA and/or kV according to patient size and/or use of iterative reconstruction technique. COMPARISON:  08/09/2022.  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 Atrophy of the cerebral hemispheres with no focal identifiable insult. No mass, hemorrhage, hydrocephalus or extra-axial collection. Vascular: Atherosclerotic calcification of the major vessels at the base of the brain. Skull: Negative Sinuses/Orbits: Maxillary sinuses are clear. Chronic opacification of the sphenoid sinus with enlargement on the right that could indicate a mucocele. ENT evaluation recommended if not already done. Orbits negative. Other: None IMPRESSION: 1. No acute CT finding. 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 Atrophy of the cerebral hemispheres. 2. Chronic opacification of the sphenoid sinus with enlargement on the right that could indicate a mucocele. ENT evaluation recommended if not already done. Electronically Signed   By: Paulina Fusi M.D.   On: 12/02/2022 16:25    Procedures Procedures    Medications Ordered in ED Medications  meclizine (ANTIVERT) tablet 25 mg (25 mg Oral Given 12/02/22 1531)    ED Course/ Medical Decision Making/ A&P                                 Medical Decision Making Amount and/or Complexity of Data Reviewed Radiology: ordered.   Steve Brown is here with dizziness, right-sided weakness.  History of prior strokes.  He feels a little bit weaker on the right arm and leg than normal.  Symptoms started about 8 hours ago.  He has no aphasia or visual field deficit or neglect.  He does not meet LVO criteria.  She has had a history of vertigo as well.  Denies any chest pain shortness of breath weakness numbness tingling.  He was having right arm weakness and some discomfort.  He had a CBC CMP and troponin ordered prior to my evaluation.  I have no concern that this is a cardiac process.  I do think this could be vertigo versus may be a stroke.  He is already on Xarelto and fairly optimized from a stroke standpoint but I do think he will need an MRI.  CT scan was done here that showed no acute findings per  radiology report.  He had no significant leukocytosis or electrolyte abnormality or kidney injury per  my review and interpretation of labs.  Troponin at his baseline.  Overall he has been given meclizine.  He is ambulatory but does still have some trace right-sided weakness and given his history I think it is reasonable to get an MRI to rule out stroke but this could be a peripheral process.  Anticipate discharge if MRI is unremarkable.  Dr. Rhunette Croft accepts the patient in transfer for MRI ED to ED.  This chart was dictated using voice recognition software.  Despite best efforts to proofread,  errors can occur which can change the documentation meaning.         Final Clinical Impression(s) / ED Diagnoses Final diagnoses:  Dizziness    Rx / DC Orders ED Discharge Orders     None         Virgina Norfolk, DO 12/02/22 1643

## 2022-12-02 NOTE — ED Notes (Signed)
[facility] called for patient transport to [facility] ED Rhunette Croft MD accepting

## 2022-12-02 NOTE — ED Notes (Signed)
Patient back from CT.

## 2022-12-02 NOTE — ED Notes (Signed)
ED TO INPATIENT HANDOFF REPORT  ED Nurse Name and Phone #: Dominga Ferry [facility] Paramedic [phone]  S Name/Age/Gender Steve Brown 82 y.o. male Room/Bed: MH09/MH09  Code Status   Code Status: Prior  Home/SNF/Other Home Patient oriented to: self, place, time, and situation Is this baseline? Yes   Triage Complete: Triage complete  Chief Complaint d  Triage Note Arrived via [facility] with c/o of dizziness and blurred vision, worse when turning head side to side. BP 164/84, HR 54, CBG 150, requested to be eval here    Allergies Allergies  Allergen Reactions   Other Other (See Comments)    Per patient- cardiac cath dye-  "woke up during procedure hysterical."   Phenergan [Promethazine] Other (See Comments)    Mood changes    Iohexol Other (See Comments)    Patient refuses contrast after having a hysterical event in hospital //r ls spoke with patient    Phenergan [Promethazine]     Other reaction(s): syncope   Tramadol     Increases Blood Sugar     Level of Care/Admitting Diagnosis ED Disposition     ED Disposition  Transfer via Transport   Condition  --   Comment  The patient appears reasonably stabilized for transfer considering the current resources, flow, and capabilities available in the ED at this time, and I doubt any other [facility] requiring further screening and/or treatment in the ED prior to transfer is p resent.          B Medical/Surgery History Past Medical History:  Diagnosis Date   Abnormal prostate biopsy    Anticoagulant long-term use    currently xarelto   Anxiety    BPH with elevated PSA    CKD (chronic kidney disease), stage II    Complication of anesthesia    limted neck rom limited use of left arm due to cva   Coronary artery disease    CARDIOLOGIST-  DR Eldridge Dace--  2010-- PCI w/ stenting midLAD   DDD (degenerative disc disease), lumbar    Degeneration of cervical intervertebral disc    Depression    Diabetes mellitus without  complication (HCC)    Type 2   Dyspnea on exertion    GERD (gastroesophageal reflux disease)    Hemiparesis due to cerebral infarction    History of cerebrovascular accident (CVA) with residual deficit 2002 and 2003--  hemiparisis both sides   per MRI  anterior left frontal lobe, left para midline pons, and inferior cerebullam bilaterally infarcts   History of pulmonary embolus (PE)    06-30-2012  extensive bilaterally   History of recurrent TIAs    History of syncope    hx multiple pre-syncope and syncopal episodes due to vasovagal, orthostatic hypotension, dehydration   History of TIAs    several since 2002   Hyperlipidemia    Hypertension    Mild atherosclerosis of carotid artery, bilateral    per last duplex 11-04-2014  bilateral ICA 1--39%   Neuropathy    fingers   OSA on CPAP    uses most nights; followed by dr dohmeier--  sev. osa w/ AHI 65.9   Pneumonia    x 1   Prostate cancer (HCC) dx 2018   Renal insufficiency    S/P coronary artery stent placement 2010   stenting to mid LAD   Sensorineural hearing loss, bilateral 04/01/2021   no hearing aids   Simple renal cyst    bilaterally   Stroke (HCC)    Trigger finger of  both hands 11/17/2013   Type 2 diabetes mellitus (HCC) dx 1986   last one A1c 9.2 on 04-26-2016   Unsteady gait    uses straight cane and occasional walker. Hx prior CVA/TIAs;   Vertebral artery occlusion, left    chronic   Past Surgical History:  Procedure Laterality Date   ANTERIOR CERVICAL DECOMP/DISCECTOMY FUSION  2004   C3 -- C6 limited rom   ARTERY BIOPSY Right 04/21/2022   Procedure: BIOPSY TEMPORAL ARTERY;  Surgeon: Cephus Shelling, MD;  [facility];  Service: Vascular;  Laterality: Right;   CARDIAC CATHETERIZATION  06-10-2010   dr Eldridge Dace   wide patent LAD stent, mid lesion at the origin of the septal prior to the previous stent 40-50%/  normal LVF, ef 55%   CARDIOVASCULAR STRESS TEST  10-23-2012  dr Eldridge Dace   normal nuclear  perfusion study w/ no ischemia/  normal LV function and wall motion , ef 65%   CARPAL TUNNEL RELEASE Bilateral    CATARACT EXTRACTION W/ INTRAOCULAR LENS  IMPLANT, BILATERAL     CHOLECYSTECTOMY N/A 11/02/2015   Procedure: LAPAROSCOPIC CHOLECYSTECTOMY WITH INTRAOPERATIVE CHOLANGIOGRAM;  Surgeon: Manus Rudd, MD;  Location: [facility] OR;  Service: General;  Laterality: N/A;   COLONOSCOPY     CORONARY ANGIOPLASTY WITH STENT PLACEMENT  02/2008   stenting to mid LAD   GOLD SEED IMPLANT N/A 11/15/2016   Procedure: GOLD SEED IMPLANT TIMES THREE;  Surgeon: Crist Fat, MD;  Location: [facility];  Service: Urology;  Laterality: N/A;   IR ANGIO INTRA EXTRACRAN SEL COM CAROTID INNOMINATE BILAT MOD SED  06/13/2018   IR ANGIO VERTEBRAL SEL VERTEBRAL UNI R MOD SED  06/13/2018   IR US GUIDE VASC ACCESS RIGHT  06/13/2018   LEFT HEART CATH AND CORONARY ANGIOGRAPHY N/A 05/25/2017   Procedure: LEFT HEART CATH AND CORONARY ANGIOGRAPHY;  Surgeon: Corky Crafts, MD;  Location: [facility] INVASIVE CV LAB;  Service: Cardiovascular;  Laterality: N/A;   LEFT HEART CATHETERIZATION WITH CORONARY ANGIOGRAM N/A 04/03/2013   Procedure: LEFT HEART CATHETERIZATION WITH CORONARY ANGIOGRAM;  Surgeon: Corky Crafts, MD;  Location: [facility] CATH LAB;  Service: Cardiovascular;  Laterality: N/A;  patent mLAD stent  w/ mild disease in remainder LAD and its branches;  mod. focal lesion midLCFx- FFR of lesion was negative for ischemia/  normal LVSF, ef 50%   lungs  2005   "fluid pumped off lungs"   NEUROPLASTY / TRANSPOSITION ULNAR NERVE AT ELBOW Right 2004   PROSTATE BIOPSY N/A 08/31/2016   Procedure: PROSTATE  BIOPSY TRANSRECTAL ULTRASONIC PROSTATE (TUBP);  Surgeon: Crist Fat, MD;  Location: [facility];  Service: Urology;  Laterality: N/A;   SPACE OAR INSTILLATION N/A 11/15/2016   Procedure: SPACE OAR INSTILLATION;  Surgeon: Crist Fat, MD;  Location: [facility];  Service:  Urology;  Laterality: N/A;   TRANSTHORACIC ECHOCARDIOGRAM  04/27/2016   severe focal basal LVH, ef 60-65%,  grade 2 diastoilc dysfunction/  mild AR, MR, and TR/  atrial septum lipomatous hypertrophy/  PASP   UMBILICAL HERNIA REPAIR       A IV Location/Drains/Wounds Patient Lines/Drains/Airways Status     Active Line/Drains/Airways     Name Placement date Placement time Site Days   Peripheral IV 12/02/22 20 G 1.16" Left Antecubital 12/02/22  1447  Antecubital  less than 1            Intake/Output Last 24 hours No intake or output data in  the 24 hours ending 12/02/22 1720  Labs/Imaging Results for orders placed or performed during the hospital encounter of 12/02/22 (from the past 48 hour(s))  CBC with Differential     Status: Abnormal   Collection Time: 12/02/22  2:51 PM  Result Value Ref Range   WBC 6.6 4.0 - 10.5 K/uL   RBC 4.20 (L) 4.22 - 5.81 MIL/uL   Hemoglobin 13.2 13.0 - 17.0 g/dL   HCT 46.9 (L) 62.9 - 52.8 %   MCV 91.9 80.0 - 100.0 fL   MCH 31.4 26.0 - 34.0 pg   MCHC 34.2 30.0 - 36.0 g/dL   RDW 41.3 24.4 - 01.0 %   Platelets 176 150 - 400 K/uL   nRBC 0.0 0.0 - 0.2 %   Neutrophils Relative % 68 %   Neutro Abs 4.5 1.7 - 7.7 K/uL   Lymphocytes Relative 21 %   Lymphs Abs 1.4 0.7 - 4.0 K/uL   Monocytes Relative 10 %   Monocytes Absolute 0.7 0.1 - 1.0 K/uL   Eosinophils Relative 0 %   Eosinophils Absolute 0.0 0.0 - 0.5 K/uL   Basophils Relative 1 %   Basophils Absolute 0.0 0.0 - 0.1 K/uL   Immature Granulocytes 0 %   Abs Immature Granulocytes 0.01 0.00 - 0.07 K/uL    Comment: Performed at [facility], [address] [place], [place] [address]  Comprehensive metabolic panel     Status: Abnormal   Collection Time: 12/02/22  2:51 PM  Result Value Ref Range   Sodium 139 135 - 145 mmol/L   Potassium 4.6 3.5 - 5.1 mmol/L   Chloride 104 98 - 111 mmol/L   CO2 30 22 - 32 mmol/L   Glucose, Bld 123 (H) 70 - 99 mg/dL    Comment: Glucose reference  range applies only to samples taken after fasting for at least 8 hours.   BUN 18 8 - 23 mg/dL   Creatinine, Ser 6.64 (H) 0.61 - 1.24 mg/dL   Calcium 8.8 (L) 8.9 - 10.3 mg/dL   Total Protein 6.9 6.5 - 8.1 g/dL   Albumin 3.7 3.5 - 5.0 g/dL   AST 23 15 - 41 U/L   ALT 19 0 - 44 U/L   Alkaline Phosphatase 71 38 - 126 U/L   Total Bilirubin 1.0 <1.2 mg/dL   GFR, Estimated 53 (L) >60 mL/min    Comment: (NOTE) Calculated using the CKD-EPI Creatinine Equation (2021)    Anion gap 5 5 - 15    Comment: Performed at [facility], [address] [place], [place] [address]  Troponin I (High Sensitivity)     Status: Abnormal   Collection Time: 12/02/22  2:51 PM  Result Value Ref Range   Troponin I (High Sensitivity) 27 (H) <18 ng/L    Comment: (NOTE) Elevated high sensitivity troponin I (hsTnI) values and significant  changes across serial measurements may suggest ACS but many other  chronic and acute conditions are known to elevate hsTnI results.  Refer to the "Links" section for chest pain algorithms and additional  guidance. Performed at [facility], [address] [place], [place] [address]   Troponin I (High Sensitivity)     Status: Abnormal   Collection Time: 12/02/22  4:47 PM  Result Value Ref Range   Troponin I (High Sensitivity) 25 (H) <18 ng/L    Comment: (NOTE) Elevated high sensitivity troponin I (hsTnI) values and significant  changes across serial measurements  may suggest ACS but many other  chronic and acute conditions are known to elevate hsTnI results.  Refer to the "Links" section for chest pain algorithms and additional  guidance. Performed at [facility], [address] [place], [place] [address]    CT Head Wo Contrast  Result Date: 12/02/2022 CLINICAL DATA:  Neuro deficit, acute, stroke suspected. Awoke with dizziness and weakness of the right face and arm EXAM: CT HEAD WITHOUT CONTRAST TECHNIQUE: Contiguous axial images were  obtained from the base of the skull through the vertex without intravenous contrast. RADIATION DOSE REDUCTION: This exam was performed according to the departmental dose-optimization program which includes automated exposure control, adjustment of the mA and/or kV according to patient size and/or use of iterative reconstruction technique. COMPARISON:  08/09/2022.  MRI 02/07/2022 FINDINGS: Brain: Old left cerebellar small vessel infarction. Atrophy of the cerebral hemispheres with no focal identifiable insult. No mass, hemorrhage, hydrocephalus or extra-axial collection. Vascular: Atherosclerotic calcification of the major vessels at the base of the brain. Skull: Negative Sinuses/Orbits: Maxillary sinuses are clear. Chronic opacification of the sphenoid sinus with enlargement on the right that could indicate a mucocele. ENT evaluation recommended if not already done. Orbits negative. Other: None IMPRESSION: 1. No acute CT finding. 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 Atrophy of the cerebral hemispheres. 2. Chronic opacification of the sphenoid sinus with enlargement on the right that could indicate a mucocele. ENT evaluation recommended if not already done. Electronically Signed   By: Paulina Fusi M.D.   On: 12/02/2022 16:25    Pending Labs Unresulted Labs (From admission, onward)    None       Vitals/Pain Today's Vitals   12/02/22 1447 12/02/22 1448  BP:  (!) 149/75  Pulse:  (!) 54  Resp:  17  Temp:  98.1 F (36.7 C)  TempSrc:  Oral  Weight:  184 lb (83.5 kg)  Height:  5\' 5"  (1.651 m)  PainSc: 9      Isolation Precautions No active isolations  Medications Medications  meclizine (ANTIVERT) tablet 25 mg (25 mg Oral Given 12/02/22 1531)    Mobility walks with person assist     Focused Assessments Neuro Assessment Handoff:  Swallow screen pass? Yes    NIH Stroke Scale  Dizziness Present: Yes Headache Present: Yes Interval: Initial Level of Consciousness (1a.)   :  Alert, keenly responsive LOC Questions (1b. )   : Answers both questions correctly LOC Commands (1c. )   : Performs both tasks correctly Best Gaze (2. )  : Normal Visual (3. )  : No visual loss Facial Palsy (4. )    : Normal symmetrical movements Motor Arm, Left (5a. )   : No drift Motor Arm, Right (5b. ) : No drift Motor Leg, Left (6a. )  : No drift Motor Leg, Right (6b. ) : No drift Limb Ataxia (7. ): Absent Sensory (8. )  : Normal, no sensory loss Best Language (9. )  : No aphasia Dysarthria (10. ): Normal Extinction/Inattention (11.)   : No Abnormality Complete NIHSS TOTAL: 0     Neuro Assessment:   Neuro Checks:   Initial (12/02/22 1446)  Has TPA been given? No If patient is a Neuro Trauma and patient is going to OR before floor call report to 4N Charge nurse: [phone] or [phone]   R Recommendations: See Admitting Provider Note  Report given to:   Additional Notes:

## 2022-12-02 NOTE — ED Notes (Signed)
Patient transported to CT 

## 2022-12-02 NOTE — ED Triage Notes (Signed)
Arrived via [facility] with c/o of dizziness and blurred vision, worse when turning head side to side. BP 164/84, HR 54, CBG 150, requested to be eval here

## 2022-12-02 NOTE — ED Notes (Signed)
Fall risk armband Fall risk sign Patient wearing shoes

## 2022-12-03 ENCOUNTER — Other Ambulatory Visit: Payer: Self-pay

## 2022-12-03 ENCOUNTER — Emergency Department (HOSPITAL_COMMUNITY): Payer: Medicare Other

## 2022-12-03 DIAGNOSIS — Z471 Aftercare following joint replacement surgery: Secondary | ICD-10-CM | POA: Diagnosis not present

## 2022-12-03 DIAGNOSIS — I69351 Hemiplegia and hemiparesis following cerebral infarction affecting right dominant side: Secondary | ICD-10-CM | POA: Diagnosis not present

## 2022-12-03 DIAGNOSIS — R42 Dizziness and giddiness: Secondary | ICD-10-CM | POA: Diagnosis not present

## 2022-12-03 NOTE — Discharge Instructions (Signed)
Your exam shows you have had an episode of vertigo, which causes a false sense of movement such as a spinning feeling or walls that seem to move.  Most vertigo is caused by a (usually temporary) problem in the inner ear. Rarely, the back part of the brain can cause vertigo (some mini-strokes/strokes), but it appears to be a low risk cause for you at this time. It is important to follow-up with your doctor however, to see if you need further testing.   Do not drive or participate in potentially dangerous activities requiring balance unless off meds (not drowsy) and the vertigo has resolved. Most of the time benign vertigo is much better after a few days. However, mild unsteadiness may last for up to 3 months in some patients. An MRI scan or other special tests to evaluate your hearing and balance may be needed if the vertigo does not improve or returns in the future.   RETURN IMMEDIATELY IF YOU HAVE ANY OF THE FOLLOWING (call 911): Increasing vertigo, earache, ear drainage, or loss of hearing.  Severe headache, blurred or double vision, or trouble walking.  Fainting or poorly responsive, extreme weakness, chest pain, or palpitations.  Fever, persistent vomiting, or dehydration.  Numbness, tingling, incoordination, or weakness of the limbs.  Change in speech, vision, swallowing, understanding, or other concerns.

## 2022-12-03 NOTE — ED Provider Notes (Signed)
Patient is improved. MRI is negative for acute CVA. He is awake and alert.  No arm or leg drift. He is able to stand and walk. He already has meclizine at home.  Patient is safe for discharge   Zadie Rhine, MD 12/03/22 [phone]

## 2022-12-03 NOTE — ED Notes (Signed)
Pt ambulated in hallway with RN by side, no assistance needed, steady gate noted.

## 2022-12-04 LAB — CBG MONITORING, ED: Glucose-Capillary: 103 mg/dL — ABNORMAL HIGH (ref 70–99)

## 2022-12-14 ENCOUNTER — Ambulatory Visit: Payer: Medicare Other | Admitting: Adult Health

## 2022-12-14 ENCOUNTER — Encounter: Payer: Self-pay | Admitting: Adult Health

## 2022-12-14 VITALS — BP 95/51 | HR 59 | Ht 64.0 in | Wt 184.0 lb

## 2022-12-14 DIAGNOSIS — G4733 Obstructive sleep apnea (adult) (pediatric): Secondary | ICD-10-CM

## 2022-12-14 DIAGNOSIS — G479 Sleep disorder, unspecified: Secondary | ICD-10-CM | POA: Diagnosis not present

## 2022-12-14 DIAGNOSIS — R270 Ataxia, unspecified: Secondary | ICD-10-CM | POA: Diagnosis not present

## 2022-12-14 DIAGNOSIS — R2681 Unsteadiness on feet: Secondary | ICD-10-CM | POA: Diagnosis not present

## 2022-12-14 DIAGNOSIS — R413 Other amnesia: Secondary | ICD-10-CM

## 2022-12-14 NOTE — Progress Notes (Signed)
PATIENT: Steve Brown DOB: 11/28/1940  REASON FOR VISIT: follow up HISTORY FROM: patient PRIMARY NEUROLOGIST: Dr. Vickey Huger  Chief Complaint  Patient presents with   RM 19    Patient is here for cpap follow-up and memory. He states he hasn't been using his cpap much. He has been having "crazy dreams" and wakes up hysterical. He states he has been seeing psychiatry and the [place] for his memory. 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 His daughter put an airtag on his car. He has to take a driving test for 2 hrs next week. He fell about 4 months ago and bumped his head. He was in the hospital 4 days. MMSE 24/30 Anim 9     HISTORY OF PRESENT ILLNESS: Today 12/14/22  Steve Brown is a 82 y.o. male who has been followed in this office for OSA on CPAP, memory disturbance. Returns today for follow-up.  Patient reports that he has not been using his CPAP consistently.  He continues to have trouble with his memory.  [facility]  He has been taking that in the a.m.  States that he has been waking up with crazy dreams.  Sometimes hysterical.  He lives at home alone.  Able to complete all ADLs independently.  He manages his own medications and appointments.  States that he sometimes cooks his own meals.  He does drive however he reports that he told the [facility] that he sometimes gets lost and now he is scheduled for a 2-hour driving test next week.  He returns today for an evaluation.       HISTORY (copied from Dr. Vickey Huger):   Steve Brown is a 82 y.o. male patient who is here for memory loss, has been seen for CVA, OSA, nocturnal hypoxia, Dizziness, ataxia, temporal Headaches, DM and neuropathy,  and diplopia in the past.  This visit  date is  06/28/2022 . He had undergone a temporal artery biopsy. No evidence of arteritis was seen. He reports ongoing pain in the temple  , right more than left .   He reports trouble with CPAP compliance and waking up panicked.    Steve Brown is a 82 y.o. male patient who is here for  memory troubles, has still HA and dizziness. Has lost his way when driving. He also was seen for left rotator cuff injury by orthopedic, and ED visit in pain on  05-29-2022.  The patient underwent an MRI of the brain on 02-07-2022 at [facility] for a syncope-presyncope suspected dizziness by cerebrovascular disease and transient left leg weakness.  There was no acute intracranial abnormality found he has multiple old small vessel infarcts in the cerebellum which is a posterior perfusion area chronic occlusion of the left vertebral artery was known before.  Multifocal small vessel disease.  Interestingly normal parenchymal the radiologist did not state that there was any atrophy.  This study had been ordered by primary care physician Dr. Tenny Craw.  It followed a CT of the head at [facility] which was compared to a study from 05-30-2021.  That also was negative for acute infection, infarct, hemorrhage or hydrocephalus.  The chronic left cerebellar infarct has always affected the patient's coordination.  The CT made mention of complete opacification of the right sphenoid and left frontal sinus. Seen by Dr Dionne Ano, MD in ENT.     Todays Memory testing for suspected  vascular dementia : Dr Tenny Craw recommended Prevagen. He scored 27/ 30 points on MMSE. 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 Physically active while working.                  03-20-2022: Chief concern according to patient :  Diplopia ,  facial and scalp dyseasthesias, pressure sensation, and pain from the temple to the jaw joint on the right side. He reports throbbing and no electric shock sensation , neither spasm.  Jaw feels as if it lock.   Vision is more blurry on the right, but may be in both eyes. He is no longer driving in the dark, not on highways.  He was seen at the  [facility] on 03-03-2022 where he had a urine test and CMET , GFR is reduced  : creatinine is 1.3 GFR 54. Has Proteinuria. HbA1c was 8.8 (!) Normal lipid profile.  Normal liver function, sciatica treated with oxycodone. glucose. CBC : anemia  Hgb 13/ Hct 38.    I am worried about his history of strokes and cerebrovascular disease, but also about giant cell arteritis.  His right temporal artery was palpable and he localized the tenderness to that spot. ED visit doesn't mention any dizziness, vision changes only back pain-  Referral for ongoing dizziness for the last 3 months. Low blood pressure . Pt reports dizziness while sitting, while driving, especially when overtaken by  larger vehicles or driving over a bridge. . At the time also notices blurred vision.  These are further described as wavy lines, and may be what causes the dizziness sensation. He has an appointment  with ENT / Audiology. Ophthalmologist did reportedly state- eye health all OK. Dr Tenny Craw reduced the dose of losartan. BP here today 107/ 61 mmHg. Regular heart rate.       Interval history : 01-18-2021:  transient Bilateral blindness, transient followed by horizontal diplopia, 11/ 2022.  Here for follow up after normal angio CT in ED. ED visit recently-  turns out ED visit was in November 2022- Followed by CT head and CT angiogram.    18 January 2021:  Steve Brown was originally seen here as a sleep medicine patient, and he is using his CPAP compliantly. See attached data sheet to today's visit.  The patient endorsed today the FSS fatigue severity score at 38/ 63 points, epworth 4-5 points only. Compliance by days 86% average user time average user time 5 hours and 37 minutes on days used 75% also days he used his machine over 4 hours.  Set pressure at 13 cmH2O with 1 cm EPR, residual AHI is 3.2.  High air leak.  Possibly some central apneas present as well.    This is his last sleep study; IMPRESSION:  1. Complex,  mostly Obstructive Sleep Apnea (OSA) a6t AHI of 48.1/  h - high severity.  2. Primary Snoring  3. Some Central and Mixed Sleep Apnea was present.    4. Prolonged Hypoxemia, most severe desaturation during REM sleep  ( see screen shots)        REVIEW OF SYSTEMS: Out of a complete 14 system review of symptoms, the patient complains only of the following symptoms, and all other reviewed systems are negative.  ALLERGIES: Allergies  Allergen Reactions   Other Other (See Comments)    Per patient- cardiac cath dye-  "woke up during procedure hysterical."   Phenergan [Promethazine] Other (See Comments)    Mood  changes    Iohexol Other (See Comments)    Patient refuses contrast after having a hysterical event in hospital //r ls spoke with patient    Phenergan [Promethazine]     Other reaction(s): syncope   Tramadol     Increases Blood Sugar     HOME MEDICATIONS: Outpatient Medications Prior to Visit  Medication Sig Dispense Refill   busPIRone (BUSPAR) 5 MG tablet Take 5 mg by mouth daily.     cetirizine (ZYRTEC) 5 MG tablet Take 2 tablets (10 mg total) by mouth daily. 15 tablet 0   Cholecalciferol (VITAMIN D-1000 MAX ST) 25 MCG (1000 UT) tablet Take 1,000 Units by mouth daily.     diclofenac Sodium (VOLTAREN) 1 % GEL Apply 4 g topically 4 (four) times daily. (Patient taking differently: Apply 4 g topically 2 (two) times daily as needed (joint pain).) 100 g 0   escitalopram (LEXAPRO) 20 MG tablet Take 20 mg by mouth at bedtime.     furosemide (LASIX) 20 MG tablet Take 1-2 tablets (20-40 mg total) by mouth daily as needed for fluid. (Patient taking differently: Take 20 mg by mouth daily as needed for fluid.) 30 tablet 0   HYDROcodone-acetaminophen (NORCO/VICODIN) 5-325 MG tablet Take 1 tablet by mouth daily for severe pain. 30 tablet 0   hydrOXYzine (VISTARIL) 25 MG capsule Take 25 mg by mouth every 8 (eight) hours as needed for anxiety.     insulin aspart (NOVOLOG) 100 UNIT/ML FlexPen  Inject 5 Units into the skin 3 (three) times daily with meals. (Patient taking differently: Inject 5-9 Units into the skin 3 (three) times daily with meals.) 15 mL 0   ipratropium (ATROVENT) 0.06 % nasal spray Place 2 sprays into both nostrils 3 (three) times daily.     ketoconazole (NIZORAL) 2 % cream Apply 1 Application topically 2 (two) times daily.     losartan (COZAAR) 25 MG tablet Take 25 mg by mouth daily.     meclizine (ANTIVERT) 25 MG tablet Take 1 tablet (25 mg total) by mouth 2 (two) times daily as needed for dizziness. 30 tablet 0   nitroGLYCERIN (NITROSTAT) 0.4 MG SL tablet Place 0.4 mg under the tongue every 5 (five) minutes as needed for chest pain.     nystatin (MYCOSTATIN/NYSTOP) powder Apply 1 Application topically as needed (irritation).     nystatin cream (MYCOSTATIN) Apply 1 Application topically as needed for rash.     pregabalin (LYRICA) 300 MG capsule Take 300 mg by mouth 2 (two) times daily.     rosuvastatin (CRESTOR) 20 MG tablet Take 1 tablet (20 mg total) by mouth daily. Please call office to schedule an appt for further refills. Thank you 90 tablet 0   TRESIBA FLEXTOUCH 100 UNIT/ML FlexTouch Pen Inject 34 Units into the skin daily. (Patient taking differently: Inject 36 Units into the skin daily.) 3 mL 0   XARELTO 20 MG TABS tablet Take 1 tablet (20 mg total) by mouth daily. 30 tablet 0   No facility-administered medications prior to visit.    PAST MEDICAL HISTORY: Past Medical History:  Diagnosis Date   Abnormal prostate biopsy    Anticoagulant long-term use    currently xarelto   Anxiety    BPH with elevated PSA    CKD (chronic kidney disease), stage II    Complication of anesthesia    limted neck rom limited use of left arm due to cva   Coronary artery disease    CARDIOLOGIST-  DR Eldridge Dace--  2010-- PCI w/ stenting midLAD   DDD (degenerative disc disease), lumbar    Degeneration of cervical intervertebral disc    Depression    Diabetes mellitus without  complication (HCC)    Type 2   Dyspnea on exertion    GERD (gastroesophageal reflux disease)    Hemiparesis due to cerebral infarction    History of cerebrovascular accident (CVA) with residual deficit 2002 and 2003--  hemiparisis both sides   per MRI  anterior left frontal lobe, left para midline pons, and inferior cerebullam bilaterally infarcts   History of pulmonary embolus (PE)    06-30-2012  extensive bilaterally   History of recurrent TIAs    History of syncope    hx multiple pre-syncope and syncopal episodes due to vasovagal, orthostatic hypotension, dehydration   History of TIAs    several since 2002   Hyperlipidemia    Hypertension    Mild atherosclerosis of carotid artery, bilateral    per last duplex 11-04-2014  bilateral ICA 1--39%   Neuropathy    fingers   OSA on CPAP    uses most nights; followed by dr dohmeier--  sev. osa w/ AHI 65.9   Pneumonia    x 1   Prostate cancer (HCC) dx 2018   Renal insufficiency    S/P coronary artery stent placement 2010   stenting to mid LAD   Sensorineural hearing loss, bilateral 04/01/2021   no hearing aids   Simple renal cyst    bilaterally   Stroke [facility])    Trigger finger of both hands 11/17/2013   Type 2 diabetes mellitus (HCC) dx 1986   last one A1c 9.2 on 04-26-2016   Unsteady gait    uses straight cane and occasional walker. Hx prior CVA/TIAs;   Vertebral artery occlusion, left    chronic    PAST SURGICAL HISTORY: Past Surgical History:  Procedure Laterality Date   ANTERIOR CERVICAL DECOMP/DISCECTOMY FUSION  2004   C3 -- C6 limited rom   ARTERY BIOPSY Right 04/21/2022   Procedure: BIOPSY TEMPORAL ARTERY;  Surgeon: Cephus Shelling, MD;  Location: [facility] OR;  Service: Vascular;  Laterality: Right;   CARDIAC CATHETERIZATION  06-10-2010   dr Eldridge Dace   wide patent LAD stent, mid lesion at the origin of the septal prior to the previous stent 40-50%/  normal LVF, ef 55%   CARDIOVASCULAR STRESS TEST  10-23-2012  dr  Eldridge Dace   normal nuclear perfusion study w/ no ischemia/  normal LV function and wall motion , ef 65%   CARPAL TUNNEL RELEASE Bilateral    CATARACT EXTRACTION W/ INTRAOCULAR LENS  IMPLANT, BILATERAL     CHOLECYSTECTOMY N/A 11/02/2015   Procedure: LAPAROSCOPIC CHOLECYSTECTOMY WITH INTRAOPERATIVE CHOLANGIOGRAM;  Surgeon: Manus Rudd, MD;  Location: [facility] OR;  Service: General;  Laterality: N/A;   COLONOSCOPY     CORONARY ANGIOPLASTY WITH STENT PLACEMENT  02/2008   stenting to mid LAD   GOLD SEED IMPLANT N/A 11/15/2016   Procedure: GOLD SEED IMPLANT TIMES THREE;  Surgeon: Crist Fat, MD;  Location[facility];  Service: Urology;  Laterality: N/A;   IR ANGIO INTRA EXTRACRAN SEL COM CAROTID INNOMINATE BILAT MOD SED  06/13/2018   IR ANGIO VERTEBRAL SEL VERTEBRAL UNI R MOD SED  06/13/2018   IR US GUIDE VASC ACCESS RIGHT  06/13/2018   LEFT HEART CATH AND CORONARY ANGIOGRAPHY N/A 05/25/2017   Procedure: LEFT HEART CATH AND CORONARY ANGIOGRAPHY;  Surgeon: Corky Crafts, MD;  Location: [facility] INVASIVE  CV LAB;  Service: Cardiovascular;  Laterality: N/A;   LEFT HEART CATHETERIZATION WITH CORONARY ANGIOGRAM N/A 04/03/2013   Procedure: LEFT HEART CATHETERIZATION WITH CORONARY ANGIOGRAM;  Surgeon: Corky Crafts, MD;  Location: [facility] CATH LAB;  Service: Cardiovascular;  Laterality: N/A;  patent mLAD stent  w/ mild disease in remainder LAD and its branches;  mod. focal lesion midLCFx- FFR of lesion was negative for ischemia/  normal LVSF, ef 50%   lungs  2005   "fluid pumped off lungs"   NEUROPLASTY / TRANSPOSITION ULNAR NERVE AT ELBOW Right 2004   PROSTATE BIOPSY N/A 08/31/2016   Procedure: PROSTATE  BIOPSY TRANSRECTAL ULTRASONIC PROSTATE (TUBP);  Surgeon: Crist Fat, MD;  Location: [facility];  Service: Urology;  Laterality: N/A;   SPACE OAR INSTILLATION N/A 11/15/2016   Procedure: SPACE OAR INSTILLATION;  Surgeon: Crist Fat, MD;  Location: [facility];  Service: Urology;  Laterality: N/A;   TRANSTHORACIC ECHOCARDIOGRAM  04/27/2016   severe focal basal LVH, ef 60-65%,  grade 2 diastoilc dysfunction/  mild AR, Steve, and TR/  atrial septum lipomatous hypertrophy/  PASP   UMBILICAL HERNIA REPAIR      FAMILY HISTORY: Family History  Problem Relation Age of Onset   Aneurysm Mother    Cancer Father        unknown either pancreatic or prostate   Dementia Sister    Stroke Brother    Heart attack Neg Hx    Sleep apnea Neg Hx     SOCIAL HISTORY: Social History   Socioeconomic History   Marital status: Single    Spouse name: Not on file   Number of children: 4   Years of education: 12   Highest education level: 12th grade  Occupational History    Employer: RETIRED    Comment: retired  Tobacco Use   Smoking status: Never    Passive exposure: Never   Smokeless tobacco: Never  Vaping Use   Vaping status: Never Used  Substance and Sexual Activity   Alcohol use: Not Currently   Drug use: Never   Sexual activity: Not Currently  Other Topics Concern   Not on file  Social History Narrative   Patient lives at home alone and he is single.     Patient is retired.    Caffeine - one cup daily.   Right handed.   Social Determinants of Health   Financial Resource Strain: Low Risk  (02/10/2019)   Overall Financial Resource Strain (CARDIA)    Difficulty of Paying Living Expenses: Not hard at all  Food Insecurity: No Food Insecurity (08/06/2022)   Hunger Vital Sign    Worried About Running Out of Food in the Last Year: Never true    Ran Out of Food in the Last Year: Never true  Transportation Needs: No Transportation Needs (08/06/2022)   PRAPARE - Administrator, Civil Service (Medical): No    Lack of Transportation (Non-Medical): No  Physical Activity: Inactive (09/09/2019)   Exercise Vital Sign    Days of Exercise per Week: 0 days    Minutes of Exercise per Session: 0 min  Stress: Not on file  Social  Connections: Unknown (05/20/2021)   [facility] from [facility]   Social Network    Social Network: Not on file  Intimate Partner Violence: Not At Risk (08/06/2022)   Humiliation, Afraid, Rape, and Kick questionnaire    Fear of Current or Ex-Partner: No  Emotionally Abused: No    Physically Abused: No    Sexually Abused: No      PHYSICAL EXAM  Vitals:   12/14/22 0925  BP: (!) 95/51  Pulse: (!) 59  Weight: 184 lb (83.5 kg)  Height: 5\' 4"  (1.626 m)   Body mass index is 31.58 kg/m.     12/14/2022    9:39 AM 06/28/2022    9:43 AM 08/23/2020   11:29 AM  MMSE - Mini Mental State Exam  Orientation to time 4 5 5   Orientation to Place 5 5 5   Registration 3 3 3   Attention/ Calculation 1 2 1   Recall 3 3 3   Language- name 2 objects 2 2 2   Language- repeat 1 1 1   Language- follow 3 step command 3 3 3   Language- read & follow direction 1 1 1   Write a sentence 1 1 1   Copy design 0 1 0  Total score 24 27 25      Generalized: Well developed, in no acute distress   Neurological examination  Mentation: Alert oriented to time, place, history taking. Follows all commands speech and language fluent Cranial nerve II-XII: Pupils were equal round reactive to light. Extraocular movements were full, visual field were full on confrontational test. Facial sensation and strength were normal. . Head turning and shoulder shrug  were normal and symmetric. Motor: The motor testing reveals 5 over 5 strength of all 4 extremities. Good symmetric motor tone is noted throughout.  Sensory: Sensory testing is intact to soft touch on all 4 extremities. No evidence of extinction is noted.  Coordination: Cerebellar testing reveals good finger-nose-finger and heel-to-shin bilaterally.  Gait and station: Gait is normal.   DIAGNOSTIC DATA (LABS, IMAGING, TESTING) - I reviewed patient records, labs, notes, testing and imaging myself where available.  Lab Results  Component Value Date   WBC  6.6 12/02/2022   HGB 13.2 12/02/2022   HCT 38.6 (L) 12/02/2022   MCV 91.9 12/02/2022   PLT 176 12/02/2022      Component Value Date/Time   NA 139 12/02/2022 1451   NA 138 06/07/2020 0000   NA 140 08/14/2012 1513   K 4.6 12/02/2022 1451   K 4.3 08/14/2012 1513   CL 104 12/02/2022 1451   CO2 30 12/02/2022 1451   CO2 27 08/14/2012 1513   GLUCOSE 123 (H) 12/02/2022 1451   GLUCOSE 262 (H) 08/14/2012 1513   BUN 18 12/02/2022 1451   BUN 22 (A) 06/07/2020 0000   BUN 19.0 08/14/2012 1513   CREATININE 1.36 (H) 12/02/2022 1451   CREATININE 1.29 03/19/2014 1130   CREATININE 1.3 08/14/2012 1513   CALCIUM 8.8 (L) 12/02/2022 1451   CALCIUM 9.3 08/14/2012 1513   PROT 6.9 12/02/2022 1451   PROT 6.3 11/24/2019 0944   PROT 6.9 08/14/2012 1513   ALBUMIN 3.7 12/02/2022 1451   ALBUMIN 4.0 11/24/2019 0944   ALBUMIN 3.4 (L) 08/14/2012 1513   AST 23 12/02/2022 1451   AST 15 08/14/2012 1513   ALT 19 12/02/2022 1451   ALT 14 08/14/2012 1513   ALKPHOS 71 12/02/2022 1451   ALKPHOS 92 08/14/2012 1513   BILITOT 1.0 12/02/2022 1451   BILITOT 0.6 11/24/2019 0944   BILITOT 0.39 08/14/2012 1513   GFRNONAA 53 (L) 12/02/2022 1451   GFRNONAA 67 05/19/2013 1108   GFRAA 57.29 06/07/2020 0000   GFRAA 78 05/19/2013 1108   Lab Results  Component Value Date   CHOL 139 11/24/2019   HDL 53 11/24/2019  LDLCALC 72 11/24/2019   TRIG 70 11/24/2019   CHOLHDL 2.6 11/24/2019   Lab Results  Component Value Date   HGBA1C 10.1 (H) 08/06/2022   Lab Results  Component Value Date   VITAMINB12 480 05/26/2020   Lab Results  Component Value Date   TSH 1.731 05/26/2020      ASSESSMENT AND PLAN 82 y.o. year old male  has a past medical history of Abnormal prostate biopsy, Anticoagulant long-term use, Anxiety, BPH with elevated PSA, CKD (chronic kidney disease), stage II, Complication of anesthesia, Coronary artery disease, DDD (degenerative disc disease), lumbar, Degeneration of cervical intervertebral disc,  Depression, Diabetes mellitus without complication (HCC), Dyspnea on exertion, GERD (gastroesophageal reflux disease), Hemiparesis due to cerebral infarction, History of cerebrovascular accident (CVA) with residual deficit (2002 and 2003--  hemiparisis both sides), History of pulmonary embolus (PE), History of recurrent TIAs, History of syncope, History of TIAs, Hyperlipidemia, Hypertension, Mild atherosclerosis of carotid artery, bilateral, Neuropathy, OSA on CPAP, Pneumonia, Prostate cancer (HCC) (dx 2018), Renal insufficiency, S/P coronary artery stent placement (2010), Sensorineural hearing loss, bilateral (04/01/2021), Simple renal cyst, Stroke (HCC), Trigger finger of both hands (11/17/2013), Type 2 diabetes mellitus (HCC) (dx 1986), Unsteady gait, and Vertebral artery occlusion, left. here with:  1.  Obstructive sleep apnea on CPAP  -Encouraged the patient to use his CPAP consistently as untreated sleep apnea can contribute to memory disturbance  2.  Memory disturbance  -MMSE 24 out of 30 previously 27 out of 30 -Continue Aricept 10 mg daily  3.  Sleep disturbance  -Advised he could try melatonin 1 to 3 mg 1 to 2 hours before bedtime to see if this helps with dreams   Follow-up in 6 months or sooner if needed   Butch Penny, MSN, NP-C 12/14/2022, 9:05 AM [facility] [address], [address] [place], [place] [address] [phone]

## 2022-12-14 NOTE — Patient Instructions (Addendum)
Your Plan:  Try OTC melatonin 1-3 mg about 1.5 hours before bedtime.  Continue CPAP Continue to monitor memory  Keep appointment with Dr. Kieth Brightly If your symptoms worsen or you develop new symptoms please let us know.     Thank you for coming to see [place] at [facility]. I hope we have been able to provide you high quality care today.  You may receive a patient satisfaction survey over the next few weeks. We would appreciate your feedback and comments so that we may continue to improve ourselves and the health of our patients.

## 2022-12-18 DIAGNOSIS — M47816 Spondylosis without myelopathy or radiculopathy, lumbar region: Secondary | ICD-10-CM | POA: Diagnosis not present

## 2022-12-18 DIAGNOSIS — R2681 Unsteadiness on feet: Secondary | ICD-10-CM | POA: Diagnosis not present

## 2022-12-18 DIAGNOSIS — R270 Ataxia, unspecified: Secondary | ICD-10-CM | POA: Diagnosis not present

## 2022-12-21 DIAGNOSIS — R270 Ataxia, unspecified: Secondary | ICD-10-CM | POA: Diagnosis not present

## 2022-12-21 DIAGNOSIS — R2681 Unsteadiness on feet: Secondary | ICD-10-CM | POA: Diagnosis not present

## 2022-12-26 DIAGNOSIS — R2681 Unsteadiness on feet: Secondary | ICD-10-CM | POA: Diagnosis not present

## 2022-12-26 DIAGNOSIS — R270 Ataxia, unspecified: Secondary | ICD-10-CM | POA: Diagnosis not present

## 2022-12-27 DIAGNOSIS — M545 Low back pain, unspecified: Secondary | ICD-10-CM | POA: Diagnosis not present

## 2022-12-27 DIAGNOSIS — M47816 Spondylosis without myelopathy or radiculopathy, lumbar region: Secondary | ICD-10-CM | POA: Diagnosis not present

## 2022-12-27 DIAGNOSIS — Z79891 Long term (current) use of opiate analgesic: Secondary | ICD-10-CM | POA: Diagnosis not present

## 2022-12-27 DIAGNOSIS — G8929 Other chronic pain: Secondary | ICD-10-CM | POA: Diagnosis not present

## 2022-12-29 DIAGNOSIS — R2681 Unsteadiness on feet: Secondary | ICD-10-CM | POA: Diagnosis not present

## 2022-12-29 DIAGNOSIS — R270 Ataxia, unspecified: Secondary | ICD-10-CM | POA: Diagnosis not present

## 2023-01-01 ENCOUNTER — Other Ambulatory Visit: Payer: Self-pay | Admitting: Interventional Cardiology

## 2023-01-01 DIAGNOSIS — M47816 Spondylosis without myelopathy or radiculopathy, lumbar region: Secondary | ICD-10-CM | POA: Diagnosis not present

## 2023-01-01 MED ORDER — ROSUVASTATIN CALCIUM 20 MG PO TABS: 20.0000 mg | ORAL_TABLET | Freq: Every day | ORAL | 0 refills | Status: DC

## 2023-01-01 NOTE — Addendum Note (Signed)
Addended by: Mariam Dollar on: 01/01/2023 03:21 PM   Modules accepted: Orders

## 2023-01-02 DIAGNOSIS — R270 Ataxia, unspecified: Secondary | ICD-10-CM | POA: Diagnosis not present

## 2023-01-02 DIAGNOSIS — R2681 Unsteadiness on feet: Secondary | ICD-10-CM | POA: Diagnosis not present

## 2023-01-05 DIAGNOSIS — R270 Ataxia, unspecified: Secondary | ICD-10-CM | POA: Diagnosis not present

## 2023-01-05 DIAGNOSIS — R2681 Unsteadiness on feet: Secondary | ICD-10-CM | POA: Diagnosis not present

## 2023-01-08 ENCOUNTER — Ambulatory Visit: Payer: Medicare Other | Admitting: Adult Health

## 2023-01-08 DIAGNOSIS — R2681 Unsteadiness on feet: Secondary | ICD-10-CM | POA: Diagnosis not present

## 2023-01-08 DIAGNOSIS — R270 Ataxia, unspecified: Secondary | ICD-10-CM | POA: Diagnosis not present

## 2023-01-12 DIAGNOSIS — R2681 Unsteadiness on feet: Secondary | ICD-10-CM | POA: Diagnosis not present

## 2023-01-12 DIAGNOSIS — R270 Ataxia, unspecified: Secondary | ICD-10-CM | POA: Diagnosis not present

## 2023-01-12 NOTE — Progress Notes (Signed)
 Cardiology Office Note   Date:  01/12/2023   ID:  Steve Brown, DOB 11/13/1940, MRN [id number]  PCP:  Clinic, Steve Brown    No chief complaint on file.  CAD  Wt Readings from Last 3 Encounters:  12/14/22 184 lb (83.5 kg)  12/02/22 184 lb (83.5 kg)  10/23/22 184 lb (83.5 kg)       History of Present Illness: Steve Brown is a 83 y.o. male  With a h/o CAD.  Patient is new to me having been seen by Dr Dann in past. Prostate cancer diagnosed in 2018- treated with radiation, OSA on CPAP, hypertension, hyperlipidemia, history of TIAs and stroke, CKD stage II.  Steve Brown had a stent to the LAD in 2010, 2012 cath showed patent stent and no significant CAD, cardiac cath in 2015 showed moderate disease which was negative by FFR. His most recent echocardiogram in 08/07/22 showed normal LV systolic function with EF 55-60% mild bi atrial enlargement trivial MR/AR Last myovue 08/13/19 no ischemia EF 54%      Last cath in 2019 showed: Acute Mrg lesion is 25% stenosed. Mid RCA lesion is 25% stenosed. 2nd Mrg lesion is 40% stenosed. Mid Cx lesion is 40% stenosed. Previously placed Mid LAD stent (unknown type) is widely patent. Prox LAD-1 lesion is 25% stenosed. Prox LAD-2 lesion is 25% stenosed. The left ventricular systolic function is normal. LV end diastolic pressure is normal. The left ventricular ejection fraction is 55-65% by visual estimate. There is no aortic valve stenosis.   He had a TIA affecting the left side of his body in 9/19.  MRI was OK. Seen in ED 12/02/22 with dizziness blurred vision and ? Right sided weakness TIA/CVA r/o  CT/MRI head both negative   Now on Ozempic and Tresiba  for diabetes.  Getting harder to control the sugars.     Past Medical History:  Diagnosis Date   Abnormal prostate biopsy    Anticoagulant long-term use    currently xarelto    Anxiety    BPH with elevated PSA    CKD (chronic kidney disease), stage II    Complication of  anesthesia    limted neck rom limited use of left arm due to cva   Coronary artery disease    CARDIOLOGIST-  DR DANN--  2010-- PCI w/ stenting midLAD   DDD (degenerative disc disease), lumbar    Degeneration of cervical intervertebral disc    Depression    Diabetes mellitus without complication (HCC)    Type 2   Dyspnea on exertion    GERD (gastroesophageal reflux disease)    Hemiparesis due to cerebral infarction    History of cerebrovascular accident (CVA) with residual deficit 2002 and 2003--  hemiparisis both sides   per MRI  anterior left frontal lobe, left para midline pons, and inferior cerebullam bilaterally infarcts   History of pulmonary embolus (PE)    06-30-2012  extensive bilaterally   History of recurrent TIAs    History of syncope    hx multiple pre-syncope and syncopal episodes due to vasovagal, orthostatic hypotension, dehydration   History of TIAs    several since 2002   Hyperlipidemia    Hypertension    Mild atherosclerosis of carotid artery, bilateral    per last duplex 11-04-2014  bilateral ICA 1--39%   Neuropathy    fingers   OSA on CPAP    uses most nights; followed by dr dohmeier--  sev. osa w/ AHI 65.9   Pneumonia  x 1   Prostate cancer (HCC) dx 2018   Renal insufficiency    S/P coronary artery stent placement 2010   stenting to mid LAD   Sensorineural hearing loss, bilateral 04/01/2021   no hearing aids   Simple renal cyst    bilaterally   Stroke Steve Brown)    Trigger finger of both hands 11/17/2013   Type 2 diabetes mellitus (HCC) dx 1986   last one A1c 9.2 on 04-26-2016   Unsteady gait    uses straight cane and occasional walker. Hx prior CVA/TIAs;   Vertebral artery occlusion, left    chronic    Past Surgical History:  Procedure Laterality Date   ANTERIOR CERVICAL DECOMP/DISCECTOMY FUSION  2004   C3 -- C6 limited rom   ARTERY BIOPSY Right 04/21/2022   Procedure: BIOPSY TEMPORAL ARTERY;  Surgeon: Gretta Lonni PARAS, MD;  Location: [facility]  OR;  Service: Vascular;  Laterality: Right;   CARDIAC CATHETERIZATION  06-10-2010   dr dann   wide patent LAD stent, mid lesion at the origin of the septal prior to the previous stent 40-50%/  normal LVF, ef 55%   CARDIOVASCULAR STRESS TEST  10-23-2012  dr dann   normal nuclear perfusion study w/ no ischemia/  normal LV function and wall motion , ef 65%   CARPAL TUNNEL RELEASE Bilateral    CATARACT EXTRACTION W/ INTRAOCULAR LENS  IMPLANT, BILATERAL     CHOLECYSTECTOMY N/A 11/02/2015   Procedure: LAPAROSCOPIC CHOLECYSTECTOMY WITH INTRAOPERATIVE CHOLANGIOGRAM;  Surgeon: Donnice Lima, MD;  Location: [facility] OR;  Service: General;  Laterality: N/A;   COLONOSCOPY     CORONARY ANGIOPLASTY WITH STENT PLACEMENT  02/2008   stenting to mid LAD   GOLD SEED IMPLANT N/A 11/15/2016   Procedure: GOLD SEED IMPLANT TIMES THREE;  Surgeon: Cam Morene ORN, MD;  Location[facility];  Service: Urology;  Laterality: N/A;   IR ANGIO INTRA EXTRACRAN SEL COM CAROTID INNOMINATE BILAT MOD SED  06/13/2018   IR ANGIO VERTEBRAL SEL VERTEBRAL UNI R MOD SED  06/13/2018   IR US  GUIDE VASC ACCESS RIGHT  06/13/2018   LEFT HEART CATH AND CORONARY ANGIOGRAPHY N/A 05/25/2017   Procedure: LEFT HEART CATH AND CORONARY ANGIOGRAPHY;  Surgeon: Dann Candyce RAMAN, MD;  Location: [facility] INVASIVE CV LAB;  Service: Cardiovascular;  Laterality: N/A;   LEFT HEART CATHETERIZATION WITH CORONARY ANGIOGRAM N/A 04/03/2013   Procedure: LEFT HEART CATHETERIZATION WITH CORONARY ANGIOGRAM;  Surgeon: Candyce RAMAN Dann, MD;  Location: [facility] CATH LAB;  Service: Cardiovascular;  Laterality: N/A;  patent mLAD stent  w/ mild disease in remainder LAD and its branches;  mod. focal lesion midLCFx- FFR of lesion was negative for ischemia/  normal LVSF, ef 50%   lungs  2005   fluid pumped off lungs   NEUROPLASTY / TRANSPOSITION ULNAR NERVE AT ELBOW Right 2004   PROSTATE BIOPSY N/A 08/31/2016   Procedure: PROSTATE  BIOPSY TRANSRECTAL ULTRASONIC  PROSTATE (TUBP);  Surgeon: Cam Morene ORN, MD;  Location: [facility];  Service: Urology;  Laterality: N/A;   SPACE OAR INSTILLATION N/A 11/15/2016   Procedure: SPACE OAR INSTILLATION;  Surgeon: Cam Morene ORN, MD;  Location: [facility];  Service: Urology;  Laterality: N/A;   TRANSTHORACIC ECHOCARDIOGRAM  04/27/2016   severe focal basal LVH, ef 60-65%,  grade 2 diastoilc dysfunction/  mild AR, MR, and TR/  atrial septum lipomatous hypertrophy/  PASP   UMBILICAL HERNIA REPAIR       Current Outpatient Medications  Medication Sig Dispense Refill   busPIRone  (BUSPAR ) 5 MG tablet Take 5 mg by mouth daily. (Patient not taking: Reported on 12/14/2022)     cetirizine  (ZYRTEC ) 5 MG tablet Take 2 tablets (10 mg total) by mouth daily. 15 tablet 0   Cholecalciferol  (VITAMIN D -1000 MAX ST) 25 MCG (1000 UT) tablet Take 1,000 Units by mouth daily.     diclofenac  Sodium (VOLTAREN ) 1 % GEL Apply 4 g topically 4 (four) times daily. (Patient taking differently: Apply 4 g topically 2 (two) times daily as needed (joint pain).) 100 g 0   donepezil  (ARICEPT ) 10 MG tablet Take 10 mg by mouth daily.     escitalopram  (LEXAPRO ) 20 MG tablet Take 20 mg by mouth at bedtime.     furosemide  (LASIX ) 20 MG tablet Take 1-2 tablets (20-40 mg total) by mouth daily as needed for fluid. (Patient taking differently: Take 20 mg by mouth daily as needed for fluid.) 30 tablet 0   HYDROcodone -acetaminophen  (NORCO/VICODIN) 5-325 MG tablet Take 1 tablet by mouth daily for severe pain. (Patient taking differently: Take 0.5 tablets by mouth as needed.) 30 tablet 0   hydrOXYzine  (VISTARIL ) 25 MG capsule Take 25 mg by mouth every 8 (eight) hours as needed for anxiety.     insulin  aspart (NOVOLOG ) 100 UNIT/ML FlexPen Inject 5 Units into the skin 3 (three) times daily with meals. 15 mL 0   ipratropium (ATROVENT) 0.06 % nasal spray Place 2 sprays into both nostrils 3 (three) times daily.      ketoconazole  (NIZORAL ) 2 % cream Apply 1 Application topically 2 (two) times daily.     losartan  (COZAAR ) 25 MG tablet Take 25 mg by mouth daily.     meclizine  (ANTIVERT ) 25 MG tablet Take 1 tablet (25 mg total) by mouth 2 (two) times daily as needed for dizziness. 30 tablet 0   nitroGLYCERIN  (NITROSTAT ) 0.4 MG SL tablet Place 0.4 mg under the tongue every 5 (five) minutes as needed for chest pain.     nystatin (MYCOSTATIN/NYSTOP) powder Apply 1 Application topically as needed (irritation).     nystatin cream (MYCOSTATIN) Apply 1 Application topically as needed for rash.     pregabalin  (LYRICA ) 300 MG capsule Take 300 mg by mouth 2 (two) times daily.     rosuvastatin  (CRESTOR ) 20 MG tablet Take 1 tablet (20 mg total) by mouth daily. Please call the office at [phone] to schedule an appointment for further refills. Thank you. 2nd attempt. 15 tablet 0   TRESIBA  FLEXTOUCH 100 UNIT/ML FlexTouch Pen Inject 34 Units into the skin daily. (Patient taking differently: Inject 36 Units into the skin daily.) 3 mL 0   XARELTO  20 MG TABS tablet Take 1 tablet (20 mg total) by mouth daily. 30 tablet 0   No current facility-administered medications for this visit.    Allergies:   Other, Phenergan  [promethazine ], Iohexol , Phenergan  [promethazine ], and Tramadol     Social History:  The patient  reports that he has never smoked. He has never been exposed to tobacco smoke. He has never used smokeless tobacco. He reports that he does not currently use alcohol. He reports that he does not use drugs.   Family History:  The patient's family history includes Aneurysm in his mother; Cancer in his father; Dementia in his sister; Multiple sclerosis in his granddaughter; Seizures in his daughter; Stroke in his brother and daughter.    ROS:  Please see the history of present illness.   Otherwise, review of systems are positive for  difficult to control blood sugar.   All other systems are reviewed and negative.     PHYSICAL EXAM: VS:  There were no vitals taken for this visit. , BMI There is no height or weight on file to calculate BMI. GEN: Well nourished, well developed, in no acute distress HEENT: normal Neck: no JVD, carotid bruits, or masses Cardiac: RRR; no murmurs, rubs, or gallops,; 1+ bilateral LE edema  Respiratory:  clear to auscultation bilaterally, normal work of breathing GI: soft, nontender, nondistended, + BS MS: no deformity or atrophy Skin: warm and dry, no rash Neuro:  Strength and sensation are intact Psych: euthymic mood, full affect   EKG:   The ekg ordered 5/23  demonstrates NSR, no ST changes   Recent Labs: 08/07/2022: Magnesium 1.8 12/02/2022: ALT 19; BUN 18; Creatinine, Ser 1.36; Hemoglobin 13.2; Platelets 176; Potassium 4.6; Sodium 139   Lipid Panel    Component Value Date/Time   CHOL 139 11/24/2019 0944   TRIG 70 11/24/2019 0944   HDL 53 11/24/2019 0944   CHOLHDL 2.6 11/24/2019 0944   CHOLHDL 4.6 08/12/2019 0424   VLDL 21 08/12/2019 0424   LDLCALC 72 11/24/2019 0944     Other studies Reviewed: Additional studies/ records that were reviewed today with results demonstrating: labs reviewed.   ASSESSMENT AND PLAN:  CAD: Prior stent to LAD 2010 patent by cath 2019 and non ischemic myovue 08/13/19 Continue medical Rx  DM2: in 5/23, A1C 8.2. Managed with PMD.   Anticoagulated: Tolerating Xarelto . Prior DVT/PE  Normal hemoglobin and creatinine OSA: Tolerating CPAP well. Hyperlipidemia:LDL 66. COntinue rosuvastatin .  Healthy diet will be helpful as well. HTN: Well controlled.  Continue current medications and low sodium Dash type diet.   TIA:  ? With dizziness, right sided weakness and blurred vision CT/MRI negative BS ok Carotids 05/26/20 with plaque 1-39% bilateral stenosis will update duplex    Carotid Duplex   Disposition:   FU in 1 year   Signed, Maude Emmer, MD  01/12/2023 11:28 AM    [facility] [address],  [place], [place]  [address] Phone: [phone]; Fax: [phone]

## 2023-01-16 DIAGNOSIS — R2681 Unsteadiness on feet: Secondary | ICD-10-CM | POA: Diagnosis not present

## 2023-01-16 DIAGNOSIS — R270 Ataxia, unspecified: Secondary | ICD-10-CM | POA: Diagnosis not present

## 2023-01-17 ENCOUNTER — Ambulatory Visit: Payer: Medicare Other | Attending: Cardiovascular Disease | Admitting: Cardiovascular Disease

## 2023-01-17 DIAGNOSIS — G459 Transient cerebral ischemic attack, unspecified: Secondary | ICD-10-CM

## 2023-01-18 DIAGNOSIS — R270 Ataxia, unspecified: Secondary | ICD-10-CM | POA: Diagnosis not present

## 2023-01-18 DIAGNOSIS — R2681 Unsteadiness on feet: Secondary | ICD-10-CM | POA: Diagnosis not present

## 2023-01-22 DIAGNOSIS — Z789 Other specified health status: Secondary | ICD-10-CM | POA: Diagnosis not present

## 2023-01-22 DIAGNOSIS — F515 Nightmare disorder: Secondary | ICD-10-CM | POA: Diagnosis not present

## 2023-01-22 DIAGNOSIS — Z6832 Body mass index (BMI) 32.0-32.9, adult: Secondary | ICD-10-CM | POA: Diagnosis not present

## 2023-01-23 DIAGNOSIS — R2681 Unsteadiness on feet: Secondary | ICD-10-CM | POA: Diagnosis not present

## 2023-01-23 DIAGNOSIS — R270 Ataxia, unspecified: Secondary | ICD-10-CM | POA: Diagnosis not present

## 2023-01-25 DIAGNOSIS — R270 Ataxia, unspecified: Secondary | ICD-10-CM | POA: Diagnosis not present

## 2023-01-25 DIAGNOSIS — R2681 Unsteadiness on feet: Secondary | ICD-10-CM | POA: Diagnosis not present

## 2023-01-30 DIAGNOSIS — R270 Ataxia, unspecified: Secondary | ICD-10-CM | POA: Diagnosis not present

## 2023-01-30 DIAGNOSIS — Z79891 Long term (current) use of opiate analgesic: Secondary | ICD-10-CM | POA: Diagnosis not present

## 2023-01-30 DIAGNOSIS — M545 Low back pain, unspecified: Secondary | ICD-10-CM | POA: Diagnosis not present

## 2023-01-30 DIAGNOSIS — G8929 Other chronic pain: Secondary | ICD-10-CM | POA: Diagnosis not present

## 2023-01-30 DIAGNOSIS — R2681 Unsteadiness on feet: Secondary | ICD-10-CM | POA: Diagnosis not present

## 2023-01-30 DIAGNOSIS — M47816 Spondylosis without myelopathy or radiculopathy, lumbar region: Secondary | ICD-10-CM | POA: Diagnosis not present

## 2023-02-01 DIAGNOSIS — R2681 Unsteadiness on feet: Secondary | ICD-10-CM | POA: Diagnosis not present

## 2023-02-01 DIAGNOSIS — R270 Ataxia, unspecified: Secondary | ICD-10-CM | POA: Diagnosis not present

## 2023-02-05 ENCOUNTER — Emergency Department (HOSPITAL_BASED_OUTPATIENT_CLINIC_OR_DEPARTMENT_OTHER): Payer: Medicare Other

## 2023-02-05 ENCOUNTER — Encounter (HOSPITAL_BASED_OUTPATIENT_CLINIC_OR_DEPARTMENT_OTHER): Payer: Self-pay | Admitting: Emergency Medicine

## 2023-02-05 ENCOUNTER — Other Ambulatory Visit: Payer: Self-pay

## 2023-02-05 ENCOUNTER — Emergency Department (HOSPITAL_BASED_OUTPATIENT_CLINIC_OR_DEPARTMENT_OTHER)
Admission: EM | Admit: 2023-02-05 | Discharge: 2023-02-05 | Disposition: A | Discharge status: Home / Self Care | Payer: Medicare Other | Attending: Emergency Medicine | Admitting: Emergency Medicine

## 2023-02-05 DIAGNOSIS — Z8546 Personal history of malignant neoplasm of prostate: Secondary | ICD-10-CM | POA: Insufficient documentation

## 2023-02-05 DIAGNOSIS — N189 Chronic kidney disease, unspecified: Secondary | ICD-10-CM | POA: Diagnosis not present

## 2023-02-05 DIAGNOSIS — Z7901 Long term (current) use of anticoagulants: Secondary | ICD-10-CM | POA: Diagnosis not present

## 2023-02-05 DIAGNOSIS — I129 Hypertensive chronic kidney disease with stage 1 through stage 4 chronic kidney disease, or unspecified chronic kidney disease: Secondary | ICD-10-CM | POA: Insufficient documentation

## 2023-02-05 DIAGNOSIS — Z794 Long term (current) use of insulin: Secondary | ICD-10-CM | POA: Diagnosis not present

## 2023-02-05 DIAGNOSIS — I2510 Atherosclerotic heart disease of native coronary artery without angina pectoris: Secondary | ICD-10-CM | POA: Diagnosis not present

## 2023-02-05 DIAGNOSIS — Z8673 Personal history of transient ischemic attack (TIA), and cerebral infarction without residual deficits: Secondary | ICD-10-CM | POA: Diagnosis not present

## 2023-02-05 DIAGNOSIS — I10 Essential (primary) hypertension: Secondary | ICD-10-CM | POA: Diagnosis not present

## 2023-02-05 DIAGNOSIS — I700 Atherosclerosis of aorta: Secondary | ICD-10-CM | POA: Diagnosis not present

## 2023-02-05 DIAGNOSIS — M791 Myalgia, unspecified site: Secondary | ICD-10-CM | POA: Diagnosis not present

## 2023-02-05 DIAGNOSIS — Z20822 Contact with and (suspected) exposure to covid-19: Secondary | ICD-10-CM | POA: Diagnosis not present

## 2023-02-05 DIAGNOSIS — R059 Cough, unspecified: Secondary | ICD-10-CM | POA: Diagnosis not present

## 2023-02-05 DIAGNOSIS — J069 Acute upper respiratory infection, unspecified: Secondary | ICD-10-CM | POA: Diagnosis not present

## 2023-02-05 DIAGNOSIS — B9789 Other viral agents as the cause of diseases classified elsewhere: Secondary | ICD-10-CM | POA: Diagnosis not present

## 2023-02-05 DIAGNOSIS — M7918 Myalgia, other site: Secondary | ICD-10-CM | POA: Diagnosis present

## 2023-02-05 LAB — CBC WITH DIFFERENTIAL/PLATELET
Abs Immature Granulocytes: 0.01 10*3/uL (ref 0.00–0.07)
Basophils Absolute: 0 10*3/uL (ref 0.0–0.1)
Basophils Relative: 1 %
Eosinophils Absolute: 0.1 10*3/uL (ref 0.0–0.5)
Eosinophils Relative: 1 %
HCT: 37.7 % — ABNORMAL LOW (ref 39.0–52.0)
Hemoglobin: 12.9 g/dL — ABNORMAL LOW (ref 13.0–17.0)
Immature Granulocytes: 0 %
Lymphocytes Relative: 45 %
Lymphs Abs: 2.3 10*3/uL (ref 0.7–4.0)
MCH: 31.6 pg (ref 26.0–34.0)
MCHC: 34.2 g/dL (ref 30.0–36.0)
MCV: 92.4 fL (ref 80.0–100.0)
Monocytes Absolute: 0.5 10*3/uL (ref 0.1–1.0)
Monocytes Relative: 10 %
Neutro Abs: 2.2 10*3/uL (ref 1.7–7.7)
Neutrophils Relative %: 43 %
Platelets: 175 10*3/uL (ref 150–400)
RBC: 4.08 MIL/uL — ABNORMAL LOW (ref 4.22–5.81)
RDW: 12.7 % (ref 11.5–15.5)
WBC: 5 10*3/uL (ref 4.0–10.5)
nRBC: 0 % (ref 0.0–0.2)

## 2023-02-05 LAB — COMPREHENSIVE METABOLIC PANEL
ALT: 18 U/L (ref 0–44)
AST: 22 U/L (ref 15–41)
Albumin: 3.5 g/dL (ref 3.5–5.0)
Alkaline Phosphatase: 70 U/L (ref 38–126)
Anion gap: 7 (ref 5–15)
BUN: 17 mg/dL (ref 8–23)
CO2: 28 mmol/L (ref 22–32)
Calcium: 8.7 mg/dL — ABNORMAL LOW (ref 8.9–10.3)
Chloride: 101 mmol/L (ref 98–111)
Creatinine, Ser: 1.38 mg/dL — ABNORMAL HIGH (ref 0.61–1.24)
GFR, Estimated: 52 mL/min — ABNORMAL LOW (ref >60)
Glucose, Bld: 98 mg/dL (ref 70–99)
Potassium: 4 mmol/L (ref 3.5–5.1)
Sodium: 136 mmol/L (ref 135–145)
Total Bilirubin: 1 mg/dL (ref 0.0–1.2)
Total Protein: 6.7 g/dL (ref 6.5–8.1)

## 2023-02-05 LAB — URINALYSIS, W/ REFLEX TO CULTURE (INFECTION SUSPECTED)
Bilirubin Urine: NEGATIVE
Glucose, UA: NEGATIVE mg/dL
Ketones, ur: NEGATIVE mg/dL
Nitrite: NEGATIVE
Protein, ur: NEGATIVE mg/dL
Specific Gravity, Urine: 1.01 (ref 1.005–1.030)
pH: 5.5 (ref 5.0–8.0)

## 2023-02-05 LAB — RESP PANEL BY RT-PCR (RSV, FLU A&B, COVID)  RVPGX2
Influenza A by PCR: NEGATIVE
Influenza B by PCR: NEGATIVE
Resp Syncytial Virus by PCR: NEGATIVE
SARS Coronavirus 2 by RT PCR: NEGATIVE

## 2023-02-05 LAB — LIPASE, BLOOD: Lipase: 60 U/L — ABNORMAL HIGH (ref 11–51)

## 2023-02-05 NOTE — ED Provider Notes (Signed)
[place] [facility] Provider Note   CSN: [id number] Arrival date & time: 02/05/23  1241     History  Chief Complaint  Patient presents with   Generalized Body Aches    Steve Brown is a 83 y.o. male.  With a significant history including but not limited to hypertension, GERD, CAD, previous CVA on Xarelto, IDDM 2, CKD, prostate cancer presenting to the ED for evaluation of bodyaches, cough.  Symptoms began approximately 8 days ago.  He presented to the [place] and was started on some congestion medicine and an antibiotic.  He reports he developed generalized bodyaches last night that progressively worsen.  He took a hydrocodone which did not improve his symptoms so he presents today for further evaluation.  He reports congestion, rhinorrhea, nonproductive cough, body aches.  No fevers or chills.  No abdominal pain, nausea or vomiting.  HPI     Home Medications Prior to Admission medications   Medication Sig Start Date End Date Taking? Authorizing Provider  busPIRone (BUSPAR) 5 MG tablet Take 5 mg by mouth daily. Patient not taking: Reported on 12/14/2022    [provider]  cetirizine (ZYRTEC) 5 MG tablet Take 2 tablets (10 mg total) by mouth daily. 10/23/22   Carmel Sacramento A, PA-C  Cholecalciferol (VITAMIN D-1000 MAX ST) 25 MCG (1000 UT) tablet Take 1,000 Units by mouth daily.    [provider]  diclofenac Sodium (VOLTAREN) 1 % GEL Apply 4 g topically 4 (four) times daily. Patient taking differently: Apply 4 g topically 2 (two) times daily as needed (joint pain). 09/03/19   Melene Plan, DO  donepezil (ARICEPT) 10 MG tablet Take 10 mg by mouth daily. 11/17/22   [provider]  escitalopram (LEXAPRO) 20 MG tablet Take 20 mg by mouth at bedtime.    [provider]  furosemide (LASIX) 20 MG tablet Take 1-2 tablets (20-40 mg total) by mouth daily as needed for fluid. Patient taking differently: Take 20 mg by mouth  daily as needed for fluid. 06/13/20   Medina-Vargas, Monina C, NP  HYDROcodone-acetaminophen (NORCO/VICODIN) 5-325 MG tablet Take 1 tablet by mouth daily for severe pain. Patient taking differently: Take 0.5 tablets by mouth as needed. 11/29/22     hydrOXYzine (VISTARIL) 25 MG capsule Take 25 mg by mouth every 8 (eight) hours as needed for anxiety.    [provider]  insulin aspart (NOVOLOG) 100 UNIT/ML FlexPen Inject 5 Units into the skin 3 (three) times daily with meals. 06/13/20   Medina-Vargas, Monina C, NP  ipratropium (ATROVENT) 0.06 % nasal spray Place 2 sprays into both nostrils 3 (three) times daily.    [provider]  ketoconazole (NIZORAL) 2 % cream Apply 1 Application topically 2 (two) times daily.    [provider]  losartan (COZAAR) 25 MG tablet Take 25 mg by mouth daily.    [provider]  meclizine (ANTIVERT) 25 MG tablet Take 1 tablet (25 mg total) by mouth 2 (two) times daily as needed for dizziness. 08/09/22   Glyn Ade, MD  nitroGLYCERIN (NITROSTAT) 0.4 MG SL tablet Place 0.4 mg under the tongue every 5 (five) minutes as needed for chest pain.    [provider]  nystatin (MYCOSTATIN/NYSTOP) powder Apply 1 Application topically as needed (irritation).    [provider]  nystatin cream (MYCOSTATIN) Apply 1 Application topically as needed for rash. 06/15/21   [provider]  pregabalin (LYRICA) 300 MG capsule Take 300 mg by  mouth 2 (two) times daily. 04/26/20   [provider]  rosuvastatin (CRESTOR) 20 MG tablet Take 1 tablet (20 mg total) by mouth daily. Please call the office at [phone] to schedule an appointment for further refills. Thank you. 2nd attempt. 01/01/23   Chandrasekhar, Mahesh A, MD  TRESIBA FLEXTOUCH 100 UNIT/ML FlexTouch Pen Inject 34 Units into the skin daily. Patient taking differently: Inject 36 Units into the skin daily. 06/13/20   Medina-Vargas, Monina C, NP  XARELTO 20 MG TABS  tablet Take 1 tablet (20 mg total) by mouth daily. 06/13/20   Medina-Vargas, Monina C, NP      Allergies    Other, Phenergan [promethazine], Iohexol, Phenergan [promethazine], and Tramadol    Review of Systems   Review of Systems  HENT:  Positive for congestion and rhinorrhea.   Respiratory:  Positive for cough.   Musculoskeletal:  Positive for myalgias.  All other systems reviewed and are negative.   Physical Exam Updated Vital Signs BP (!) 152/81   Pulse 61   Temp 98.5 F (36.9 C) (Oral)   Resp 18   Ht 5\' 5"  (1.651 m)   Wt 83.9 kg   SpO2 98%   BMI 30.79 kg/m  Physical Exam Vitals and nursing note reviewed.  Constitutional:      General: He is not in acute distress.    Appearance: Normal appearance. He is normal weight. He is not ill-appearing.  HENT:     Head: Normocephalic and atraumatic.  Pulmonary:     Effort: Pulmonary effort is normal. No respiratory distress.     Breath sounds: No wheezing, rhonchi or rales.  Abdominal:     General: Abdomen is flat.     Tenderness: There is abdominal tenderness (Generalized).  Musculoskeletal:        General: Normal range of motion.     Cervical back: Neck supple.  Skin:    General: Skin is warm and dry.  Neurological:     Mental Status: He is alert and oriented to person, place, and time.  Psychiatric:        Mood and Affect: Mood normal.        Behavior: Behavior normal.     ED Results / Procedures / Treatments   Labs (all labs ordered are listed, but only abnormal results are displayed) Labs Reviewed  COMPREHENSIVE METABOLIC PANEL - Abnormal; Notable for the following components:      Result Value   Creatinine, Ser 1.38 (*)    Calcium 8.7 (*)    GFR, Estimated 52 (*)    All other components within normal limits  CBC WITH DIFFERENTIAL/PLATELET - Abnormal; Notable for the following components:   RBC 4.08 (*)    Hemoglobin 12.9 (*)    HCT 37.7 (*)    All other components within normal limits  LIPASE, BLOOD -  Abnormal; Notable for the following components:   Lipase 60 (*)    All other components within normal limits  URINALYSIS, W/ REFLEX TO CULTURE (INFECTION SUSPECTED) - Abnormal; Notable for the following components:   APPearance HAZY (*)    Hgb urine dipstick LARGE (*)    Leukocytes,Ua TRACE (*)    Bacteria, UA RARE (*)    All other components within normal limits  RESP PANEL BY RT-PCR (RSV, FLU A&B, COVID)  [id number]    EKG None  Radiology DG Chest 2 View Result Date: 02/05/2023 CLINICAL DATA:  Cough and body aches. EXAM: CHEST - 2 VIEW COMPARISON:  04/10/2022 and  08/09/2022 FINDINGS: Both lungs are clear. Heart and mediastinum are within normal limits. Surgical plate in lower cervical spine. Surgical clips in the right upper abdomen. No large pleural effusions. Bridging osteophytes in lower thoracic spine. Atherosclerotic calcifications at the aortic arch. IMPRESSION: No active cardiopulmonary disease. Electronically Signed   By: Richarda Overlie M.D.   On: 02/05/2023 13:50    Procedures Procedures    Medications Ordered in ED Medications - No data to display  ED Course/ Medical Decision Making/ A&P                                 Medical Decision Making Amount and/or Complexity of Data Reviewed Labs: ordered. Radiology: ordered.  This patient presents to the ED for concern of bodyaches, URI symptoms, this involves an extensive number of treatment options, and is a complaint that carries with it a high risk of complications and morbidity.  The differential diagnosis includes flu, COVID, RSV, other viral URI, AKI  My initial workup includes labs, EKG, imaging  Additional history obtained from: Nursing notes from this visit.  I ordered, reviewed and interpreted labs which include: Respiratory panel, CBC, CMP, lipase, urinalysis.  Respiratory panel negative.  No leukocytosis or anemia.  No significant Electra derangement.  Creatinine baseline.  Lipase minimally elevated to 60.  Urine  without evidence of infection.  I ordered imaging studies including chest x-ray I independently visualized and interpreted imaging which showed negative I agree with the radiologist interpretation  Afebrile, hemodynamically stable.  83 year old male presenting to the ED for evaluation of URI type symptoms and bodyaches.  Symptoms began approximately 8 days ago.  Body aches began yesterday and have worsened.  Presents today because he took a hydrocodone with no improvement in his symptoms yesterday.  Lab workup was overall reassuring.  He did have some tenderness to palpation of the abdomen as well as the extremities.  Lipase mildly elevated at 60.  Mild pancreatitis may be contributing to his symptoms.  He appears otherwise very well on physical exam.  Suspect myalgias secondary to viral illness.  He was encouraged to continue taking his home medications and follow-up with his primary care provider.  He was given return precautions.  Stable at discharge.  At this time there does not appear to be any evidence of an acute emergency medical condition and the patient appears stable for discharge with appropriate outpatient follow up. Diagnosis was discussed with patient who verbalizes understanding of care plan and is agreeable to discharge. I have discussed return precautions with patient who verbalizes understanding. Patient encouraged to follow-up with their PCP within 1 week. All questions answered.  Patient's case discussed with Dr. Wallace Cullens who agrees with plan to discharge with follow-up.   Note: Portions of this report may have been transcribed using voice recognition software. Every effort was made to ensure accuracy; however, inadvertent computerized transcription errors may still be present.        Final Clinical Impression(s) / ED Diagnoses Final diagnoses:  Viral upper respiratory tract infection  Myalgia    Rx / DC Orders ED Discharge Orders     None         Micky, Sheller, PA-C 02/05/23 1706    Franne Forts, DO 02/05/23 2326

## 2023-02-05 NOTE — Discharge Instructions (Addendum)
You have been seen today for your complaint of upper respiratory infection, body aches. Your lab work was reassuring.  Your pancreas enzyme was slightly high.  Drink plenty of water for this. Your imaging was reassuring. Your discharge medications include your home medications.  Take a maximum of 3200 mg of Tylenol per day. Follow up with: Your primary care provider Please seek immediate medical care if you develop any of the following symptoms: You have trouble breathing. You have trouble swallowing. You have muscle pain along with a stiff neck, fever, and vomiting. You have severe muscle weakness, or you cannot move part of your body. You are urinating less, or you have dark, bloody, or discolored urine. You have redness or swelling at the site of the muscle pain. At this time there does not appear to be the presence of an emergent medical condition, however there is always the potential for conditions to change. Please read and follow the below instructions.  Do not take your medicine if  develop an itchy rash, swelling in your mouth or lips, or difficulty breathing; call 911 and seek immediate emergency medical attention if this occurs.  You may review your lab tests and imaging results in their entirety on your MyChart account.  Please discuss all results of fully with your primary care provider and other specialist at your follow-up visit.  Note: Portions of this text may have been transcribed using voice recognition software. Every effort was made to ensure accuracy; however, inadvertent computerized transcription errors may still be present.

## 2023-02-05 NOTE — ED Triage Notes (Signed)
PtP POV, slow shuffling gait- c/o generalized bodyaches, cough x 2-3 days.   Recently seen at [facility] for same.

## 2023-02-06 DIAGNOSIS — R310 Gross hematuria: Secondary | ICD-10-CM | POA: Diagnosis not present

## 2023-02-06 DIAGNOSIS — Z8546 Personal history of malignant neoplasm of prostate: Secondary | ICD-10-CM | POA: Diagnosis not present

## 2023-02-06 DIAGNOSIS — J069 Acute upper respiratory infection, unspecified: Secondary | ICD-10-CM | POA: Diagnosis not present

## 2023-02-12 DIAGNOSIS — R270 Ataxia, unspecified: Secondary | ICD-10-CM | POA: Diagnosis not present

## 2023-02-12 DIAGNOSIS — R2681 Unsteadiness on feet: Secondary | ICD-10-CM | POA: Diagnosis not present

## 2023-02-16 DIAGNOSIS — M549 Dorsalgia, unspecified: Secondary | ICD-10-CM | POA: Diagnosis not present

## 2023-02-16 DIAGNOSIS — G479 Sleep disorder, unspecified: Secondary | ICD-10-CM | POA: Diagnosis not present

## 2023-02-16 DIAGNOSIS — Z87440 Personal history of urinary (tract) infections: Secondary | ICD-10-CM | POA: Diagnosis not present

## 2023-02-21 DIAGNOSIS — R270 Ataxia, unspecified: Secondary | ICD-10-CM | POA: Diagnosis not present

## 2023-02-21 DIAGNOSIS — R2681 Unsteadiness on feet: Secondary | ICD-10-CM | POA: Diagnosis not present

## 2023-02-23 DIAGNOSIS — R2681 Unsteadiness on feet: Secondary | ICD-10-CM | POA: Diagnosis not present

## 2023-02-23 DIAGNOSIS — R270 Ataxia, unspecified: Secondary | ICD-10-CM | POA: Diagnosis not present

## 2023-02-26 DIAGNOSIS — R2681 Unsteadiness on feet: Secondary | ICD-10-CM | POA: Diagnosis not present

## 2023-02-26 DIAGNOSIS — R270 Ataxia, unspecified: Secondary | ICD-10-CM | POA: Diagnosis not present

## 2023-02-26 NOTE — Progress Notes (Deleted)
 Cardiology Office Note   Date:  02/26/2023   ID:  Steve Brown, DOB 1940-02-23, MRN [id number]  PCP:  Clinic, Steve Brown    No chief complaint on file.  CAD  Wt Readings from Last 3 Encounters:  02/05/23 185 lb (83.9 kg)  12/14/22 184 lb (83.5 kg)  12/02/22 184 lb (83.5 kg)       History of Present Illness: Steve Brown is a 83 y.o. male  With a h/o CAD.  Patient  seen by Dr Eldridge Dace in past. Prostate cancer diagnosed in 2018- treated with radiation, OSA on CPAP, hypertension, hyperlipidemia, history of TIAs and stroke, CKD stage II.  Steve Brown had a stent to the LAD in 2010, 2012 cath showed patent stent and no significant CAD, cardiac cath in 2015 showed moderate disease which was negative by FFR. His most recent echocardiogram in 08/07/22 showed normal LV systolic function with EF 55-60% mild bi atrial enlargement trivial MR/AR Last myovue 08/13/19 no ischemia EF 54%      Last cath in 2019 showed: "Acute Mrg lesion is 25% stenosed. Mid RCA lesion is 25% stenosed. 2nd Mrg lesion is 40% stenosed. Mid Cx lesion is 40% stenosed. Previously placed Mid LAD stent (unknown type) is widely patent. Prox LAD-1 lesion is 25% stenosed. Prox LAD-2 lesion is 25% stenosed. The left ventricular systolic function is normal. LV end diastolic pressure is normal. The left ventricular ejection fraction is 55-65% by visual estimate. There is no aortic valve stenosis.   He had a TIA affecting the left side of his body in 9/19.  MRI was OK. Seen in ED 12/02/22 with dizziness blurred vision and ? Right sided weakness TIA/CVA r/o  CT/MRI head both negative   Now on Ozempic and Tresiba for diabetes.  Getting harder to control the sugars.  Seen in ED 02/05/23 with URI and myalgias Respiratory panel negative and CXR NAD      Past Medical History:  Diagnosis Date   Abnormal prostate biopsy    Anticoagulant long-term use    currently xarelto   Anxiety    BPH with elevated PSA     CKD (chronic kidney disease), stage II    Complication of anesthesia    limted neck rom limited use of left arm due to cva   Coronary artery disease    CARDIOLOGIST-  DR Eldridge Dace--  2010-- PCI w/ stenting midLAD   DDD (degenerative disc disease), lumbar    Degeneration of cervical intervertebral disc    Depression    Diabetes mellitus without complication (HCC)    Type 2   Dyspnea on exertion    GERD (gastroesophageal reflux disease)    Hemiparesis due to cerebral infarction    History of cerebrovascular accident (CVA) with residual deficit 2002 and 2003--  hemiparisis both sides   per MRI  anterior left frontal lobe, left para midline pons, and inferior cerebullam bilaterally infarcts   History of pulmonary embolus (PE)    06-30-2012  extensive bilaterally   History of recurrent TIAs    History of syncope    hx multiple pre-syncope and syncopal episodes due to vasovagal, orthostatic hypotension, dehydration   History of TIAs    several since 2002   Hyperlipidemia    Hypertension    Mild atherosclerosis of carotid artery, bilateral    per last duplex 11-04-2014  bilateral ICA 1--39%   Neuropathy    fingers   OSA on CPAP    uses most nights; followed by dr  dohmeier--  sev. osa w/ AHI 65.9   Pneumonia    x 1   Prostate cancer (HCC) dx 2018   Renal insufficiency    S/P coronary artery stent placement 2010   stenting to mid LAD   Sensorineural hearing loss, bilateral 04/01/2021   no hearing aids   Simple renal cyst    bilaterally   Stroke [facility])    Trigger finger of both hands 11/17/2013   Type 2 diabetes mellitus (HCC) dx 1986   last one A1c 9.2 on 04-26-2016   Unsteady gait    uses straight cane and occasional walker. Hx prior CVA/TIAs;   Vertebral artery occlusion, left    chronic    Past Surgical History:  Procedure Laterality Date   ANTERIOR CERVICAL DECOMP/DISCECTOMY FUSION  2004   C3 -- C6 limited rom   ARTERY BIOPSY Right 04/21/2022   Procedure: BIOPSY  TEMPORAL ARTERY;  Surgeon: Cephus Shelling, MD;  Location: [facility] OR;  Service: Vascular;  Laterality: Right;   CARDIAC CATHETERIZATION  06-10-2010   dr Eldridge Dace   wide patent LAD stent, mid lesion at the origin of the septal prior to the previous stent 40-50%/  normal LVF, ef 55%   CARDIOVASCULAR STRESS TEST  10-23-2012  dr Eldridge Dace   normal nuclear perfusion study w/ no ischemia/  normal LV function and wall motion , ef 65%   CARPAL TUNNEL RELEASE Bilateral    CATARACT EXTRACTION W/ INTRAOCULAR LENS  IMPLANT, BILATERAL     CHOLECYSTECTOMY N/A 11/02/2015   Procedure: LAPAROSCOPIC CHOLECYSTECTOMY WITH INTRAOPERATIVE CHOLANGIOGRAM;  Surgeon: Manus Rudd, MD;  Location: [facility] OR;  Service: General;  Laterality: N/A;   COLONOSCOPY     CORONARY ANGIOPLASTY WITH STENT PLACEMENT  02/2008   stenting to mid LAD   GOLD SEED IMPLANT N/A 11/15/2016   Procedure: GOLD SEED IMPLANT TIMES THREE;  Surgeon: Crist Fat, MD;  Location: [facility];  Service: Urology;  Laterality: N/A;   IR ANGIO INTRA EXTRACRAN SEL COM CAROTID INNOMINATE BILAT MOD SED  06/13/2018   IR ANGIO VERTEBRAL SEL VERTEBRAL UNI R MOD SED  06/13/2018   IR US GUIDE VASC ACCESS RIGHT  06/13/2018   LEFT HEART CATH AND CORONARY ANGIOGRAPHY N/A 05/25/2017   Procedure: LEFT HEART CATH AND CORONARY ANGIOGRAPHY;  Surgeon: Corky Crafts, MD;  Location: [facility] INVASIVE CV LAB;  Service: Cardiovascular;  Laterality: N/A;   LEFT HEART CATHETERIZATION WITH CORONARY ANGIOGRAM N/A 04/03/2013   Procedure: LEFT HEART CATHETERIZATION WITH CORONARY ANGIOGRAM;  Surgeon: Corky Crafts, MD;  Location: [facility] CATH LAB;  Service: Cardiovascular;  Laterality: N/A;  patent mLAD stent  w/ mild disease in remainder LAD and its branches;  mod. focal lesion midLCFx- FFR of lesion was negative for ischemia/  normal LVSF, ef 50%   lungs  2005   "fluid pumped off lungs"   NEUROPLASTY / TRANSPOSITION ULNAR NERVE AT ELBOW Right 2004   PROSTATE BIOPSY  N/A 08/31/2016   Procedure: PROSTATE  BIOPSY TRANSRECTAL ULTRASONIC PROSTATE (TUBP);  Surgeon: Crist Fat, MD;  Location: [facility];  Service: Urology;  Laterality: N/A;   SPACE OAR INSTILLATION N/A 11/15/2016   Procedure: SPACE OAR INSTILLATION;  Surgeon: Crist Fat, MD;  Location[facility];  Service: Urology;  Laterality: N/A;   TRANSTHORACIC ECHOCARDIOGRAM  04/27/2016   severe focal basal LVH, ef 60-65%,  grade 2 diastoilc dysfunction/  mild AR, MR, and TR/  atrial septum lipomatous hypertrophy/  PASP  UMBILICAL HERNIA REPAIR       Current Outpatient Medications  Medication Sig Dispense Refill   busPIRone (BUSPAR) 5 MG tablet Take 5 mg by mouth daily. (Patient not taking: Reported on 12/14/2022)     cetirizine (ZYRTEC) 5 MG tablet Take 2 tablets (10 mg total) by mouth daily. 15 tablet 0   Cholecalciferol (VITAMIN D-1000 MAX ST) 25 MCG (1000 UT) tablet Take 1,000 Units by mouth daily.     diclofenac Sodium (VOLTAREN) 1 % GEL Apply 4 g topically 4 (four) times daily. (Patient taking differently: Apply 4 g topically 2 (two) times daily as needed (joint pain).) 100 g 0   donepezil (ARICEPT) 10 MG tablet Take 10 mg by mouth daily.     escitalopram (LEXAPRO) 20 MG tablet Take 20 mg by mouth at bedtime.     furosemide (LASIX) 20 MG tablet Take 1-2 tablets (20-40 mg total) by mouth daily as needed for fluid. (Patient taking differently: Take 20 mg by mouth daily as needed for fluid.) 30 tablet 0   HYDROcodone-acetaminophen (NORCO/VICODIN) 5-325 MG tablet Take 1 tablet by mouth daily for severe pain. (Patient taking differently: Take 0.5 tablets by mouth as needed.) 30 tablet 0   hydrOXYzine (VISTARIL) 25 MG capsule Take 25 mg by mouth every 8 (eight) hours as needed for anxiety.     insulin aspart (NOVOLOG) 100 UNIT/ML FlexPen Inject 5 Units into the skin 3 (three) times daily with meals. 15 mL 0   ipratropium (ATROVENT) 0.06 % nasal spray  Place 2 sprays into both nostrils 3 (three) times daily.     ketoconazole (NIZORAL) 2 % cream Apply 1 Application topically 2 (two) times daily.     losartan (COZAAR) 25 MG tablet Take 25 mg by mouth daily.     meclizine (ANTIVERT) 25 MG tablet Take 1 tablet (25 mg total) by mouth 2 (two) times daily as needed for dizziness. 30 tablet 0   nitroGLYCERIN (NITROSTAT) 0.4 MG SL tablet Place 0.4 mg under the tongue every 5 (five) minutes as needed for chest pain.     nystatin (MYCOSTATIN/NYSTOP) powder Apply 1 Application topically as needed (irritation).     nystatin cream (MYCOSTATIN) Apply 1 Application topically as needed for rash.     pregabalin (LYRICA) 300 MG capsule Take 300 mg by mouth 2 (two) times daily.     rosuvastatin (CRESTOR) 20 MG tablet Take 1 tablet (20 mg total) by mouth daily. Please call the office at [phone] to schedule an appointment for further refills. Thank you. 2nd attempt. 15 tablet 0   TRESIBA FLEXTOUCH 100 UNIT/ML FlexTouch Pen Inject 34 Units into the skin daily. (Patient taking differently: Inject 36 Units into the skin daily.) 3 mL 0   XARELTO 20 MG TABS tablet Take 1 tablet (20 mg total) by mouth daily. 30 tablet 0   No current facility-administered medications for this visit.    Allergies:   Other, Phenergan [promethazine], Iohexol, Phenergan [promethazine], and Tramadol    Social History:  The patient  reports that he has never smoked. He has never been exposed to tobacco smoke. He has never used smokeless tobacco. He reports that he does not currently use alcohol. He reports that he does not use drugs.   Family History:  The patient's family history includes Aneurysm in his mother; Cancer in his father; Dementia in his sister; Multiple sclerosis in his granddaughter; Seizures in his daughter; Stroke in his brother and daughter.    ROS:  Please see the  history of present illness.   Otherwise, review of systems are positive for difficult to control blood  sugar.   All other systems are reviewed and negative.    PHYSICAL EXAM: VS:  There were no vitals taken for this visit. , BMI There is no height or weight on file to calculate BMI. GEN: Well nourished, well developed, in no acute distress HEENT: normal Neck: no JVD, carotid bruits, or masses Cardiac: RRR; no murmurs, rubs, or gallops,; 1+ bilateral LE edema  Respiratory:  clear to auscultation bilaterally, normal work of breathing GI: soft, nontender, nondistended, + BS MS: no deformity or atrophy Skin: warm and dry, no rash Neuro:  Strength and sensation are intact Psych: euthymic mood, full affect   EKG:   The ekg ordered 5/23  demonstrates NSR, no ST changes   Recent Labs: 08/07/2022: Magnesium 1.8 02/05/2023: ALT 18; BUN 17; Creatinine, Ser 1.38; Hemoglobin 12.9; Platelets 175; Potassium 4.0; Sodium 136   Lipid Panel    Component Value Date/Time   CHOL 139 11/24/2019 0944   TRIG 70 11/24/2019 0944   HDL 53 11/24/2019 0944   CHOLHDL 2.6 11/24/2019 0944   CHOLHDL 4.6 08/12/2019 0424   VLDL 21 08/12/2019 0424   LDLCALC 72 11/24/2019 0944     Other studies Reviewed: Additional studies/ records that were reviewed today with results demonstrating: labs reviewed.   ASSESSMENT AND PLAN:  CAD: Prior stent to LAD 2010 patent by cath 2019 and non ischemic myovue 08/13/19 Continue medical Rx  DM2: in 5/23, A1C 10.1  Managed with PMD. On Tresiba  Anticoagulated: Tolerating Xarelto. Prior DVT/PE  Normal hemoglobin and creatinine OSA: Tolerating CPAP well. Hyperlipidemia:LDL 66. COntinue rosuvastatin.  Healthy diet will be helpful as well. HTN: Well controlled.  Continue current medications and low sodium Dash type diet.   TIA:  ? With dizziness, right sided weakness and blurred vision CT/MRI negative BS ok Carotids 05/26/20 with plaque 1-39% bilateral stenosis will update duplex    Carotid duplex    Disposition:   FU in 1 year   Signed, Charlton Haws, MD  02/26/2023 10:06 AM     [facility] [address] [place], [place], [place]  [address] Phone: [phone]; Fax: [phone]

## 2023-03-01 DIAGNOSIS — R270 Ataxia, unspecified: Secondary | ICD-10-CM | POA: Diagnosis not present

## 2023-03-01 DIAGNOSIS — R2681 Unsteadiness on feet: Secondary | ICD-10-CM | POA: Diagnosis not present

## 2023-03-05 DIAGNOSIS — R2681 Unsteadiness on feet: Secondary | ICD-10-CM | POA: Diagnosis not present

## 2023-03-05 DIAGNOSIS — R270 Ataxia, unspecified: Secondary | ICD-10-CM | POA: Diagnosis not present

## 2023-03-07 ENCOUNTER — Ambulatory Visit: Payer: 59 | Attending: Cardiovascular Disease | Admitting: Cardiovascular Disease

## 2023-03-07 DIAGNOSIS — N189 Chronic kidney disease, unspecified: Secondary | ICD-10-CM | POA: Diagnosis not present

## 2023-03-07 DIAGNOSIS — R2681 Unsteadiness on feet: Secondary | ICD-10-CM | POA: Diagnosis not present

## 2023-03-07 DIAGNOSIS — I10 Essential (primary) hypertension: Secondary | ICD-10-CM | POA: Diagnosis not present

## 2023-03-07 DIAGNOSIS — E669 Obesity, unspecified: Secondary | ICD-10-CM | POA: Diagnosis not present

## 2023-03-07 DIAGNOSIS — I639 Cerebral infarction, unspecified: Secondary | ICD-10-CM | POA: Diagnosis not present

## 2023-03-07 DIAGNOSIS — E7800 Pure hypercholesterolemia, unspecified: Secondary | ICD-10-CM | POA: Diagnosis not present

## 2023-03-07 DIAGNOSIS — R270 Ataxia, unspecified: Secondary | ICD-10-CM | POA: Diagnosis not present

## 2023-03-07 DIAGNOSIS — G609 Hereditary and idiopathic neuropathy, unspecified: Secondary | ICD-10-CM | POA: Diagnosis not present

## 2023-03-07 DIAGNOSIS — E1165 Type 2 diabetes mellitus with hyperglycemia: Secondary | ICD-10-CM | POA: Diagnosis not present

## 2023-03-07 DIAGNOSIS — C61 Malignant neoplasm of prostate: Secondary | ICD-10-CM | POA: Diagnosis not present

## 2023-03-08 ENCOUNTER — Encounter: Payer: Self-pay | Admitting: Cardiovascular Disease

## 2023-03-12 DIAGNOSIS — M47816 Spondylosis without myelopathy or radiculopathy, lumbar region: Secondary | ICD-10-CM | POA: Diagnosis not present

## 2023-03-14 DIAGNOSIS — R270 Ataxia, unspecified: Secondary | ICD-10-CM | POA: Diagnosis not present

## 2023-03-14 DIAGNOSIS — R2681 Unsteadiness on feet: Secondary | ICD-10-CM | POA: Diagnosis not present

## 2023-03-16 DIAGNOSIS — R2681 Unsteadiness on feet: Secondary | ICD-10-CM | POA: Diagnosis not present

## 2023-03-16 DIAGNOSIS — R270 Ataxia, unspecified: Secondary | ICD-10-CM | POA: Diagnosis not present

## 2023-03-19 DIAGNOSIS — R2681 Unsteadiness on feet: Secondary | ICD-10-CM | POA: Diagnosis not present

## 2023-03-19 DIAGNOSIS — R270 Ataxia, unspecified: Secondary | ICD-10-CM | POA: Diagnosis not present

## 2023-03-26 DIAGNOSIS — R270 Ataxia, unspecified: Secondary | ICD-10-CM | POA: Diagnosis not present

## 2023-03-26 DIAGNOSIS — R2681 Unsteadiness on feet: Secondary | ICD-10-CM | POA: Diagnosis not present

## 2023-03-29 DIAGNOSIS — R2681 Unsteadiness on feet: Secondary | ICD-10-CM | POA: Diagnosis not present

## 2023-03-29 DIAGNOSIS — R270 Ataxia, unspecified: Secondary | ICD-10-CM | POA: Diagnosis not present

## 2023-04-03 DIAGNOSIS — E1140 Type 2 diabetes mellitus with diabetic neuropathy, unspecified: Secondary | ICD-10-CM | POA: Diagnosis not present

## 2023-04-03 DIAGNOSIS — N183 Chronic kidney disease, stage 3 unspecified: Secondary | ICD-10-CM | POA: Diagnosis not present

## 2023-04-03 DIAGNOSIS — I10 Essential (primary) hypertension: Secondary | ICD-10-CM | POA: Diagnosis not present

## 2023-04-09 DIAGNOSIS — E782 Mixed hyperlipidemia: Secondary | ICD-10-CM | POA: Diagnosis not present

## 2023-04-09 DIAGNOSIS — R2681 Unsteadiness on feet: Secondary | ICD-10-CM | POA: Diagnosis not present

## 2023-04-09 DIAGNOSIS — R270 Ataxia, unspecified: Secondary | ICD-10-CM | POA: Diagnosis not present

## 2023-04-09 DIAGNOSIS — N183 Chronic kidney disease, stage 3 unspecified: Secondary | ICD-10-CM | POA: Diagnosis not present

## 2023-04-09 DIAGNOSIS — I10 Essential (primary) hypertension: Secondary | ICD-10-CM | POA: Diagnosis not present

## 2023-04-09 DIAGNOSIS — E1140 Type 2 diabetes mellitus with diabetic neuropathy, unspecified: Secondary | ICD-10-CM | POA: Diagnosis not present

## 2023-04-19 DIAGNOSIS — M19012 Primary osteoarthritis, left shoulder: Secondary | ICD-10-CM | POA: Diagnosis not present

## 2023-04-23 DIAGNOSIS — R2681 Unsteadiness on feet: Secondary | ICD-10-CM | POA: Diagnosis not present

## 2023-04-23 DIAGNOSIS — R270 Ataxia, unspecified: Secondary | ICD-10-CM | POA: Diagnosis not present

## 2023-04-25 NOTE — Progress Notes (Unsigned)
 Steve Brown

## 2023-04-26 ENCOUNTER — Ambulatory Visit (INDEPENDENT_AMBULATORY_CARE_PROVIDER_SITE_OTHER): Payer: Medicare Other | Admitting: Neurology

## 2023-04-26 ENCOUNTER — Encounter: Payer: Self-pay | Admitting: Neurology

## 2023-04-26 VITALS — BP 100/53 | HR 60 | Ht 65.0 in | Wt 181.0 lb

## 2023-04-26 DIAGNOSIS — M2669 Other specified disorders of temporomandibular joint: Secondary | ICD-10-CM

## 2023-04-26 DIAGNOSIS — I679 Cerebrovascular disease, unspecified: Secondary | ICD-10-CM | POA: Diagnosis not present

## 2023-04-26 DIAGNOSIS — H532 Diplopia: Secondary | ICD-10-CM

## 2023-04-26 DIAGNOSIS — G479 Sleep disorder, unspecified: Secondary | ICD-10-CM

## 2023-04-26 DIAGNOSIS — R413 Other amnesia: Secondary | ICD-10-CM | POA: Diagnosis not present

## 2023-04-26 DIAGNOSIS — Z8673 Personal history of transient ischemic attack (TIA), and cerebral infarction without residual deficits: Secondary | ICD-10-CM | POA: Diagnosis not present

## 2023-04-26 DIAGNOSIS — R42 Dizziness and giddiness: Secondary | ICD-10-CM | POA: Diagnosis not present

## 2023-04-26 NOTE — Progress Notes (Signed)
 Provider:  Melvyn Novas, MD  Primary Care Physician:  Clinic, [facility] [address] [facility] [place] [address]     Referring Provider: [facility], [facility] [address] [facility] [place],  [place] [address]          Chief Complaint according to patient   Patient presents with:          reports he has not been using to CPAP due to other health concerns. He mentions he has sever nightmares and is afraid to go to sleep. Also mentions his Memory has worsened.       HISTORY OF PRESENT ILLNESS:  Steve Brown is a 83 y.o. widowed AA male patient who is here for revisit 04/26/2023 for  nightmares today, he has a memory concern, had vascular cerebral disease, always feeling sore on the right scalp - the nightmares make it hard to go to sleep, apprehensive about sleep. His 2 adult daughters have health problems, one is no longer ambulatory and one is in a nursing home.  He calls them every day.  Chief concern according to patient :  My memory is getting worse, I am depressed- and concerned they ( the [place] )  will take my licence.  Steve Brown is a 83 y.o. male  With a h/o CAD.  Patient is new to me having been seen by Dr Eldridge Dace in past. Prostate cancer diagnosed in 2018- treated with radiation, OSA on CPAP, hypertension, hyperlipidemia, history of TIAs and stroke, CKD stage II.  [name] had a stent to the LAD in 2010, 2012 cath showed patent stent and no significant CAD, cardiac cath in 2015 showed moderate disease which was negative by FFR. His most recent echocardiogram in 08/07/22 showed normal LV systolic function with EF 55-60% mild bi atrial enlargement trivial MR/AR Last myovue 08/13/19 no ischemia EF 54%      Last cath in 2019 showed: "Acute Mrg lesion is 25% stenosed. Mid RCA lesion is 25% stenosed. 2nd Mrg lesion is 40% stenosed. Mid Cx lesion is 40% stenosed. Previously placed Mid LAD stent (unknown type) is widely  patent. Prox LAD-1 lesion is 25% stenosed. Prox LAD-2 lesion is 25% stenosed. The left ventricular systolic function is normal. LV end diastolic pressure is normal. The left ventricular ejection fraction is 55-65% by visual estimate. There is no aortic valve stenosis.   He had a TIA affecting the left side of his body in 9/19.  MRI was OK. Seen in ED 12/02/22 with dizziness blurred vision and ? Right sided weakness TIA/CVA r/o  CT/MRI head both negative    Now on Ozempic and Tresiba for diabetes.  Getting harder to control the sugars.  We are meeting today once for CPAP compliance but [name] has not been able to comply with CPAP when he uses his CPAP it is set at 13 cm water pressure with 1 cm EPR and there were 5 nights over the last 30 days where he used the machine, his residual AHI is 3.5 and he does have very high air leakage.  He has frequent nightmares as he reported and often takes the mask off at night with the fear of suffocation or discomfort it does increase his anxiety.  His Epworth Sleepiness Scale was endorsed at 11 points fatigue severity at 31 points in the geriatric depression score was endorsed at 7 points out of 15.  My assistant today reviewed the medication list but also performed a  mini mental status examination with Mr. Ide and he scored 24 out of 30 points this would still qualify as mild cognitive impairment.  I would like to add that he was fully oriented to place mostly oriented to time he was off on the date by 1 day.  He had trouble with the serial sevens but these are not indicative of his ability to drive for now.  His short-term memory was not affected he could not recall the 3 words.  He had trouble with drawing a design but not was reading comprehension or repetition.    hx of OSA, CAD, CVA, TIA, and  ataxia, unsteady gait.  Patient is here for cpap follow-up and memory. He states he hasn't been using his cpap much. He has been having "crazy dreams" and  wakes up hysterical. He states he has been seeing psychiatry and the [place] for his memory. 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 His daughter put an airtag on his car. He has to take a driving test for 2 hrs next week. He fell about 4 months ago and bumped his head. He was in the hospital 4 days. MMSE 24/30 Anim 9         MM, NP : 12/14/22   Steve Brown is a 83 y.o. male who has been followed in this office for OSA on CPAP, memory disturbance. Returns today for follow-up.  Patient reports that he has not been using his CPAP consistently.  He continues to have trouble with his memory.  [facility].  He has been taking that in the a.m.  States that he has been waking up with crazy dreams.  Sometimes hysterical.  He lives at home alone.  Able to complete all ADLs independently.  He manages his own medications and appointments.  States that he sometimes cooks his own meals.  He does drive however he reports that he told the [facility] that he sometimes gets lost and now he is scheduled for a 2-hour driving test next week.  He returns today for an evaluation.       Review of Systems: Out of a complete 14 system review, the patient complains of only the following symptoms, and all other reviewed systems are negative.:   Social History   Socioeconomic History   Marital status: Single    Spouse name: Not on file   Number of children: 4   Years of education: 56   Highest education level: 12th grade  Occupational History    Employer: RETIRED    Comment: retired  Tobacco Use   Smoking status: Never    Passive exposure: Never   Smokeless tobacco: Never  Vaping Use   Vaping status: Never Used  Substance and Sexual Activity   Alcohol use: Not Currently   Drug use: Never   Sexual activity: Not Currently  Other Topics Concern   Not on file  Social History Narrative   Patient lives at home alone and he is  single.     Patient is retired.    Caffeine - one cup daily.   Right handed.   Social Drivers of Health   Financial Resource Strain: Low Risk  (02/10/2019)   Overall Financial Resource Strain (CARDIA)    Difficulty of Paying Living Expenses: Not hard at all  Food Insecurity: No Food Insecurity (08/06/2022)   Hunger Vital Sign    Worried  About Running Out of Food in the Last Year: Never true    Ran Out of Food in the Last Year: Never true  Transportation Needs: No Transportation Needs (08/06/2022)   PRAPARE - Administrator, Civil Service (Medical): No    Lack of Transportation (Non-Medical): No  Physical Activity: Inactive (09/09/2019)   Exercise Vital Sign    Days of Exercise per Week: 0 days    Minutes of Exercise per Session: 0 min  Stress: Not on file  Social Connections: Unknown (05/20/2021)   [facility]   Social Network    Social Network: Not on file    Family History  Problem Relation Age of Onset   Aneurysm Mother    Cancer Father        unknown either pancreatic or prostate   Dementia Sister    Stroke Brother    Stroke Daughter    Seizures Daughter    Multiple sclerosis Granddaughter    Heart attack Neg Hx    Sleep apnea Neg Hx     Past Medical History:  Diagnosis Date   Abnormal prostate biopsy    Anticoagulant long-term use    currently xarelto   Anxiety    BPH with elevated PSA    CKD (chronic kidney disease), stage II    Complication of anesthesia    limted neck rom limited use of left arm due to cva   Coronary artery disease    CARDIOLOGIST-  DR Eldridge Dace--  2010-- PCI w/ stenting midLAD   DDD (degenerative disc disease), lumbar    Degeneration of cervical intervertebral disc    Depression    Diabetes mellitus without complication (HCC)    Type 2   Dyspnea on exertion    GERD (gastroesophageal reflux disease)    Hemiparesis due to cerebral infarction    History of cerebrovascular accident (CVA) with  residual deficit 2002 and 2003--  hemiparisis both sides   per MRI  anterior left frontal lobe, left para midline pons, and inferior cerebullam bilaterally infarcts   History of pulmonary embolus (PE)    06-30-2012  extensive bilaterally   History of recurrent TIAs    History of syncope    hx multiple pre-syncope and syncopal episodes due to vasovagal, orthostatic hypotension, dehydration   History of TIAs    several since 2002   Hyperlipidemia    Hypertension    Mild atherosclerosis of carotid artery, bilateral    per last duplex 11-04-2014  bilateral ICA 1--39%   Neuropathy    fingers   OSA on CPAP    uses most nights; followed by dr Mckennon Zwart--  sev. osa w/ AHI 65.9   Pneumonia    x 1   Prostate cancer (HCC) dx 2018   Renal insufficiency    S/P coronary artery stent placement 2010   stenting to mid LAD   Sensorineural hearing loss, bilateral 04/01/2021   no hearing aids   Simple renal cyst    bilaterally   Stroke [facility])    Trigger finger of both hands 11/17/2013   Type 2 diabetes mellitus (HCC) dx 1986   last one A1c 9.2 on 04-26-2016   Unsteady gait    uses straight cane and occasional walker. Hx prior CVA/TIAs;   Vertebral artery occlusion, left    chronic    Past Surgical History:  Procedure Laterality Date   ANTERIOR CERVICAL DECOMP/DISCECTOMY FUSION  2004   C3 -- C6 limited rom  ARTERY BIOPSY Right 04/21/2022   Procedure: BIOPSY TEMPORAL ARTERY;  Surgeon: Cephus Shelling, MD;  Location: [facility] OR;  Service: Vascular;  Laterality: Right;   CARDIAC CATHETERIZATION  06-10-2010   dr Eldridge Dace   wide patent LAD stent, mid lesion at the origin of the septal prior to the previous stent 40-50%/  normal LVF, ef 55%   CARDIOVASCULAR STRESS TEST  10-23-2012  dr Eldridge Dace   normal nuclear perfusion study w/ no ischemia/  normal LV function and wall motion , ef 65%   CARPAL TUNNEL RELEASE Bilateral    CATARACT EXTRACTION W/ INTRAOCULAR LENS  IMPLANT, BILATERAL      CHOLECYSTECTOMY N/A 11/02/2015   Procedure: LAPAROSCOPIC CHOLECYSTECTOMY WITH INTRAOPERATIVE CHOLANGIOGRAM;  Surgeon: Manus Rudd, MD;  Location: [facility] OR;  Service: General;  Laterality: N/A;   COLONOSCOPY     CORONARY ANGIOPLASTY WITH STENT PLACEMENT  02/2008   stenting to mid LAD   GOLD SEED IMPLANT N/A 11/15/2016   Procedure: GOLD SEED IMPLANT TIMES THREE;  Surgeon: Crist Fat, MD;  Location: [facility];  Service: Urology;  Laterality: N/A;   IR ANGIO INTRA EXTRACRAN SEL COM CAROTID INNOMINATE BILAT MOD SED  06/13/2018   IR ANGIO VERTEBRAL SEL VERTEBRAL UNI R MOD SED  06/13/2018   IR US GUIDE VASC ACCESS RIGHT  06/13/2018   LEFT HEART CATH AND CORONARY ANGIOGRAPHY N/A 05/25/2017   Procedure: LEFT HEART CATH AND CORONARY ANGIOGRAPHY;  Surgeon: Corky Crafts, MD;  Location: [facility] INVASIVE CV LAB;  Service: Cardiovascular;  Laterality: N/A;   LEFT HEART CATHETERIZATION WITH CORONARY ANGIOGRAM N/A 04/03/2013   Procedure: LEFT HEART CATHETERIZATION WITH CORONARY ANGIOGRAM;  Surgeon: Corky Crafts, MD;  Location: [facility] CATH LAB;  Service: Cardiovascular;  Laterality: N/A;  patent mLAD stent  w/ mild disease in remainder LAD and its branches;  mod. focal lesion midLCFx- FFR of lesion was negative for ischemia/  normal LVSF, ef 50%   lungs  2005   "fluid pumped off lungs"   NEUROPLASTY / TRANSPOSITION ULNAR NERVE AT ELBOW Right 2004   PROSTATE BIOPSY N/A 08/31/2016   Procedure: PROSTATE  BIOPSY TRANSRECTAL ULTRASONIC PROSTATE (TUBP);  Surgeon: Crist Fat, MD;  Location: [facility];  Service: Urology;  Laterality: N/A;   SPACE OAR INSTILLATION N/A 11/15/2016   Procedure: SPACE OAR INSTILLATION;  Surgeon: Crist Fat, MD;  Location: [facility];  Service: Urology;  Laterality: N/A;   TRANSTHORACIC ECHOCARDIOGRAM  04/27/2016   severe focal basal LVH, ef 60-65%,  grade 2 diastoilc dysfunction/  mild AR, MR, and TR/  atrial septum  lipomatous hypertrophy/  PASP   UMBILICAL HERNIA REPAIR       Current Outpatient Medications on File Prior to Visit  Medication Sig Dispense Refill   cetirizine (ZYRTEC) 5 MG tablet Take 2 tablets (10 mg total) by mouth daily. 15 tablet 0   Cholecalciferol (VITAMIN D-1000 MAX ST) 25 MCG (1000 UT) tablet Take 1,000 Units by mouth daily.     diclofenac Sodium (VOLTAREN) 1 % GEL Apply 4 g topically 4 (four) times daily. (Patient taking differently: Apply 4 g topically 2 (two) times daily as needed (joint pain).) 100 g 0   donepezil (ARICEPT) 10 MG tablet Take 10 mg by mouth daily.     escitalopram (LEXAPRO) 20 MG tablet Take 20 mg by mouth at bedtime.     furosemide (LASIX) 20 MG tablet Take 1-2 tablets (20-40 mg total) by mouth daily as needed  for fluid. (Patient taking differently: Take 20 mg by mouth daily as needed for fluid.) 30 tablet 0   HYDROcodone-acetaminophen (NORCO/VICODIN) 5-325 MG tablet Take 1 tablet by mouth daily for severe pain. (Patient taking differently: Take 0.5 tablets by mouth as needed.) 30 tablet 0   hydrOXYzine (VISTARIL) 25 MG capsule Take 25 mg by mouth every 8 (eight) hours as needed for anxiety.     insulin aspart (NOVOLOG) 100 UNIT/ML FlexPen Inject 5 Units into the skin 3 (three) times daily with meals. 15 mL 0   ipratropium (ATROVENT) 0.06 % nasal spray Place 2 sprays into both nostrils 3 (three) times daily.     ketoconazole (NIZORAL) 2 % cream Apply 1 Application topically 2 (two) times daily.     losartan (COZAAR) 25 MG tablet Take 25 mg by mouth daily.     meclizine (ANTIVERT) 25 MG tablet Take 1 tablet (25 mg total) by mouth 2 (two) times daily as needed for dizziness. 30 tablet 0   nitroGLYCERIN (NITROSTAT) 0.4 MG SL tablet Place 0.4 mg under the tongue every 5 (five) minutes as needed for chest pain.     nystatin (MYCOSTATIN/NYSTOP) powder Apply 1 Application topically as needed (irritation).     nystatin cream (MYCOSTATIN) Apply 1 Application  topically as needed for rash.     pregabalin (LYRICA) 300 MG capsule Take 300 mg by mouth 2 (two) times daily.     rosuvastatin (CRESTOR) 20 MG tablet Take 1 tablet (20 mg total) by mouth daily. Please call the office at [phone] to schedule an appointment for further refills. Thank you. 2nd attempt. 15 tablet 0   TRESIBA FLEXTOUCH 100 UNIT/ML FlexTouch Pen Inject 34 Units into the skin daily. (Patient taking differently: Inject 36 Units into the skin daily.) 3 mL 0   XARELTO 20 MG TABS tablet Take 1 tablet (20 mg total) by mouth daily. 30 tablet 0   No current facility-administered medications on file prior to visit.    Allergies  Allergen Reactions   Other Other (See Comments)    Per patient- cardiac cath dye-  "woke up during procedure hysterical."   Phenergan [Promethazine] Other (See Comments)    Mood changes    Iohexol Other (See Comments)    Patient refuses contrast after having a hysterical event in hospital //r ls spoke with patient    Phenergan [Promethazine]     Other reaction(s): syncope   Tramadol     Increases Blood Sugar      DIAGNOSTIC DATA (LABS, IMAGING, TESTING) - I reviewed patient records, labs, notes, testing and imaging myself where available.  Lab Results  Component Value Date   WBC 5.0 02/05/2023   HGB 12.9 (L) 02/05/2023   HCT 37.7 (L) 02/05/2023   MCV 92.4 02/05/2023   PLT 175 02/05/2023      Component Value Date/Time   NA 136 02/05/2023 1549   NA 138 06/07/2020 0000   NA 140 08/14/2012 1513   K 4.0 02/05/2023 1549   K 4.3 08/14/2012 1513   CL 101 02/05/2023 1549   CO2 28 02/05/2023 1549   CO2 27 08/14/2012 1513   GLUCOSE 98 02/05/2023 1549   GLUCOSE 262 (H) 08/14/2012 1513   BUN 17 02/05/2023 1549   BUN 22 (A) 06/07/2020 0000   BUN 19.0 08/14/2012 1513   CREATININE 1.38 (H) 02/05/2023 1549   CREATININE 1.29 03/19/2014 1130   CREATININE 1.3 08/14/2012 1513   CALCIUM 8.7 (L) 02/05/2023 1549   CALCIUM 9.3 08/14/2012 1513  PROT 6.7  02/05/2023 1549   PROT 6.3 11/24/2019 0944   PROT 6.9 08/14/2012 1513   ALBUMIN 3.5 02/05/2023 1549   ALBUMIN 4.0 11/24/2019 0944   ALBUMIN 3.4 (L) 08/14/2012 1513   AST 22 02/05/2023 1549   AST 15 08/14/2012 1513   ALT 18 02/05/2023 1549   ALT 14 08/14/2012 1513   ALKPHOS 70 02/05/2023 1549   ALKPHOS 92 08/14/2012 1513   BILITOT 1.0 02/05/2023 1549   BILITOT 0.6 11/24/2019 0944   BILITOT 0.39 08/14/2012 1513   GFRNONAA 52 (L) 02/05/2023 1549   GFRNONAA 67 05/19/2013 1108   GFRAA 57.29 06/07/2020 0000   GFRAA 78 05/19/2013 1108   Lab Results  Component Value Date   CHOL 139 11/24/2019   HDL 53 11/24/2019   LDLCALC 72 11/24/2019   TRIG 70 11/24/2019   CHOLHDL 2.6 11/24/2019   Lab Results  Component Value Date   HGBA1C 10.1 (H) 08/06/2022   Lab Results  Component Value Date   VITAMINB12 480 05/26/2020   Lab Results  Component Value Date   TSH 1.731 05/26/2020    PHYSICAL EXAM:  Today's Vitals   04/26/23 0938  BP: (!) 100/53  Pulse: 60  Weight: 181 lb (82.1 kg)  Height: 5\' 5"  (1.651 m)   Body mass index is 30.12 kg/m.   Wt Readings from Last 3 Encounters:  04/26/23 181 lb (82.1 kg)  02/05/23 185 lb (83.9 kg)  12/14/22 184 lb (83.5 kg)     Ht Readings from Last 3 Encounters:  04/26/23 5\' 5"  (1.651 m)  02/05/23 5\' 5"  (1.651 m)  12/14/22 5\' 4"  (1.626 m)      General: The patient is awake, alert and appears not in acute distress. The patient is well groomed. Head: Normocephalic, atraumatic. Neck is supple. Cardiovascular:  Regular rate and cardiac rhythm by pulse,  without distended neck veins. Respiratory: Lungs are clear to auscultation.  Skin:  Without evidence of ankle edema, or rash. Trunk: The patient's posture is erect.   NEUROLOGIC EXAM: The patient is awake and alert, oriented to place and time.   Memory subjective described as impaired      04/26/2023    9:40 AM 12/14/2022    9:39 AM 06/28/2022    9:43 AM 08/23/2020   11:29 AM 07/31/2017    11:21 AM  MMSE - Mini Mental State Exam  Orientation to time 4 4 5 5 5   Orientation to Place 5 5 5 5 5   Registration 3 3 3 3 3   Attention/ Calculation 1 1 2 1 3   Recall 3 3 3 3 2   Language- name 2 objects 2 2 2 2 2   Language- repeat 1 1 1 1 1   Language- follow 3 step command 3 3 3 3 3   Language- read & follow direction 1 1 1 1 1   Write a sentence 1 1 1 1 1   Copy design 0 0 1 0 0  Total score 24 24 27 25 26       Attention span & concentration ability appears normal.  Speech is fluent,  without  dysarthria, dysphonia or aphasia.  Mood and affect are appropriate.   Cranial nerves: no loss of smell or taste reported  equal pupils- arcus senilis, almost un-reactive to light-  from 3 to 2mm. status post blepharoplasty-  Patient has diplopia with gaze to the left lower quadrant.  .  Face is symmetric without facial masking-  no nystagmus noted.    Mallampati is class  3.  Tongue protrudes centrally & palate elevates symmetrically.  Neck:  Supple.ring grossly intact.   Speech is clear , mildly hypophonic with no dysarthria noted.   There is no lip, neck or jaw tremor.   Neck is mildly rigid.  Mental Status:  The patient is awake, alert, oriented . His memory, language & knowledge are appropriate.   Sensory:  Fine touch, pinprick, vibration & temperature sensation were decreased at ankle level.  Motor exam:   Normal tone and normal muscle bulk and symmetric normal strength in all extremities.  No resting tremor, no postural or action tremor.  Gait : the patient cannot rise from a chair without bracing himself, he is in PT for that.  He walks slowly and with smaller steps, turns to either side 180 degrees with 4.5 steps/ small steps, no drift- with some reduction in bilateral arm swing. No tremor as he walks but he is borderline shuffling and his left leg "is weaker" and stiff , he reports.  Tandem gait impaired -  with ataxia.  Can toe walk and showed negative Romberg- he feels he is  moving but stood still.      Cerebellar testing:  No dysmetria or intention tremor on finger-to-nose testing.   Arms were extended fully, clearly left sided pronator drift and drift downwards- but he has shoulder pain-  There is no truncal weakness.  Left finger to nose with ataxia and dysmetria.     ASSESSMENT AND PLAN 83 y.o. year old male  here with:  hx of OSA, CAD, CVA, TIA, and  ataxia, unsteady gait.  Has had vascular dementia / MCI for years and also depression related changes, pseudodementia. Awaiting neuropsychologist  for testing.  On buspar and aricept.  He has PTSD he stated.     Residual left body hemiataxia and weakness, related to cerebellar strokes, vertebral occlusion.  There have been no new strokes seen. His history of cerebellar strokes and white matter cerebral microvascular disease is likely cause for his memory decline.    2) Diplopia resolved now- he reports he just got used to it- present every day. Can be related to DM, steroid treatment with caveat - DM not well controlled.   3) daily headaches in right over left temple, biopsy was negative for arteritis, but CT positive for sinus disease and he saw Dr Melissa Spring.   4) vascular dementia is the most likely cause of disorientation spells, getting lost, forgetting and misplace  items around the house. Still driving- but shouldn't.  Unable to keep track of his finances.   Prevagen may help, its never tested like a pharmaceutical product. I like him to stay on Aricept.    Continue with prevagen. He is in PT already, he feels its helping but not taking his problems away.    PS : He is concerned about living alone. He would be well served with an assited living arrangement, but cannot afford it, he stated.   I wish him well for his upcoming driving evaluation.  I recommended to use Lidocaine topically  on the scalp, a cream he already has.  I cannot force him to use CPAP.    I like for his [facility] doctors -to consider  PTSD treatment by catapres.    Neomia Banner, MD  [facility] Board certified by The [employer] of Sleep Medicine and Diplomate of the [employer] of Sleep Medicine. Board certified In Neurology through the ABPN, Fellow of the [employer]  Academy of Neurology.   I plan to follow up either personally or through our NP within 12 months.   I would like to thank [facility], [facility] and [facility], [facility] [address] ,  [place] [address] for allowing me to meet with and to take care of this pleasant patient.   After spending a total time of  35  minutes face to face and additional time for physical and neurologic examination, review of laboratory studies,  personal review of imaging studies, reports and results of other testing and review of referral information / records as far as provided in visit,   Electronically signed by: Neomia Banner, MD 04/26/2023 9:58 AM  [facility] Board certified by The [employer] of Sleep Medicine and Diplomate of the [employer] of Sleep Medicine. Board certified In Neurology through the ABPN, Fellow of the [employer] of Neurology.

## 2023-04-26 NOTE — Patient Instructions (Signed)
 Memory Compensation Strategies  Use "WARM" strategy.  W= write it down  A= associate it  R= repeat it  M= make a mental note  2.   You can keep a Glass blower/designer.  Use a 3-ring notebook with sections for the following: calendar, important names and phone numbers, medications, doctors' names/phone numbers, lists/reminders, and a section to journal what you did each day.   3.    Use a calendar to write appointments down.  4.    Write yourself a schedule for the day.  This can be placed on the calendar or in a separate section of the Memory Notebook.  Keeping a  regular schedule can help memory.  5.    Use medication organizer with sections for each day or morning/evening pills.  You may need help loading it  6.    Keep a basket, or pegboard by the door.  Place items that you need to take out with you in the basket or on the pegboard.  You may also want to include a message board for reminders.  7.    Use sticky notes.  Place sticky notes with reminders in a place where the task is performed.  For example: " turn off the  stove" placed by the stove, "lock the door" placed on the door at eye level, " take your medications" on the bathroom mirror or by the place where you normally take your medications.  8.    Use alarms/timers.  Use while cooking to remind yourself to check on food or as a reminder to take your medicine, or as a  reminder to make a call, or as a reminder to perform another task, etc.

## 2023-05-02 DIAGNOSIS — E1140 Type 2 diabetes mellitus with diabetic neuropathy, unspecified: Secondary | ICD-10-CM | POA: Diagnosis not present

## 2023-05-02 DIAGNOSIS — I10 Essential (primary) hypertension: Secondary | ICD-10-CM | POA: Diagnosis not present

## 2023-05-02 DIAGNOSIS — N183 Chronic kidney disease, stage 3 unspecified: Secondary | ICD-10-CM | POA: Diagnosis not present

## 2023-05-04 DIAGNOSIS — R2681 Unsteadiness on feet: Secondary | ICD-10-CM | POA: Diagnosis not present

## 2023-05-04 DIAGNOSIS — R270 Ataxia, unspecified: Secondary | ICD-10-CM | POA: Diagnosis not present

## 2023-05-09 DIAGNOSIS — E1140 Type 2 diabetes mellitus with diabetic neuropathy, unspecified: Secondary | ICD-10-CM | POA: Diagnosis not present

## 2023-05-09 DIAGNOSIS — E782 Mixed hyperlipidemia: Secondary | ICD-10-CM | POA: Diagnosis not present

## 2023-05-09 DIAGNOSIS — I10 Essential (primary) hypertension: Secondary | ICD-10-CM | POA: Diagnosis not present

## 2023-05-09 DIAGNOSIS — N183 Chronic kidney disease, stage 3 unspecified: Secondary | ICD-10-CM | POA: Diagnosis not present

## 2023-05-10 ENCOUNTER — Encounter: Payer: Medicare Other | Attending: Psychology | Admitting: Psychology

## 2023-05-10 ENCOUNTER — Other Ambulatory Visit: Payer: Self-pay | Admitting: Nephrology

## 2023-05-10 DIAGNOSIS — N1832 Chronic kidney disease, stage 3b: Secondary | ICD-10-CM

## 2023-05-14 ENCOUNTER — Ambulatory Visit
Admission: RE | Admit: 2023-05-14 | Discharge: 2023-05-14 | Disposition: A | Discharge status: Home / Self Care | Source: Ambulatory Visit | Attending: Nephrology | Admitting: Nephrology

## 2023-05-14 DIAGNOSIS — N1832 Chronic kidney disease, stage 3b: Secondary | ICD-10-CM

## 2023-05-14 DIAGNOSIS — N183 Chronic kidney disease, stage 3 unspecified: Secondary | ICD-10-CM | POA: Diagnosis not present

## 2023-05-17 DIAGNOSIS — R2681 Unsteadiness on feet: Secondary | ICD-10-CM | POA: Diagnosis not present

## 2023-05-17 DIAGNOSIS — R270 Ataxia, unspecified: Secondary | ICD-10-CM | POA: Diagnosis not present

## 2023-05-21 DIAGNOSIS — G8929 Other chronic pain: Secondary | ICD-10-CM | POA: Diagnosis not present

## 2023-05-21 DIAGNOSIS — M25812 Other specified joint disorders, left shoulder: Secondary | ICD-10-CM | POA: Diagnosis not present

## 2023-05-24 DIAGNOSIS — G8929 Other chronic pain: Secondary | ICD-10-CM | POA: Diagnosis not present

## 2023-05-24 DIAGNOSIS — M545 Low back pain, unspecified: Secondary | ICD-10-CM | POA: Diagnosis not present

## 2023-05-24 DIAGNOSIS — Z79891 Long term (current) use of opiate analgesic: Secondary | ICD-10-CM | POA: Diagnosis not present

## 2023-05-24 DIAGNOSIS — M47816 Spondylosis without myelopathy or radiculopathy, lumbar region: Secondary | ICD-10-CM | POA: Diagnosis not present

## 2023-05-24 DIAGNOSIS — M5116 Intervertebral disc disorders with radiculopathy, lumbar region: Secondary | ICD-10-CM | POA: Diagnosis not present

## 2023-05-31 DIAGNOSIS — R270 Ataxia, unspecified: Secondary | ICD-10-CM | POA: Diagnosis not present

## 2023-05-31 DIAGNOSIS — R2681 Unsteadiness on feet: Secondary | ICD-10-CM | POA: Diagnosis not present

## 2023-06-01 DIAGNOSIS — N183 Chronic kidney disease, stage 3 unspecified: Secondary | ICD-10-CM | POA: Diagnosis not present

## 2023-06-01 DIAGNOSIS — E1140 Type 2 diabetes mellitus with diabetic neuropathy, unspecified: Secondary | ICD-10-CM | POA: Diagnosis not present

## 2023-06-01 DIAGNOSIS — I10 Essential (primary) hypertension: Secondary | ICD-10-CM | POA: Diagnosis not present

## 2023-06-09 DIAGNOSIS — I10 Essential (primary) hypertension: Secondary | ICD-10-CM | POA: Diagnosis not present

## 2023-06-09 DIAGNOSIS — E782 Mixed hyperlipidemia: Secondary | ICD-10-CM | POA: Diagnosis not present

## 2023-06-09 DIAGNOSIS — E1140 Type 2 diabetes mellitus with diabetic neuropathy, unspecified: Secondary | ICD-10-CM | POA: Diagnosis not present

## 2023-06-09 DIAGNOSIS — N183 Chronic kidney disease, stage 3 unspecified: Secondary | ICD-10-CM | POA: Diagnosis not present

## 2023-06-13 ENCOUNTER — Ambulatory Visit: Admitting: Podiatry

## 2023-06-15 ENCOUNTER — Emergency Department (HOSPITAL_BASED_OUTPATIENT_CLINIC_OR_DEPARTMENT_OTHER)
Admission: EM | Admit: 2023-06-15 | Discharge: 2023-06-15 | Disposition: A | Discharge status: Home / Self Care | Attending: Emergency Medicine | Admitting: Emergency Medicine

## 2023-06-15 ENCOUNTER — Emergency Department (HOSPITAL_BASED_OUTPATIENT_CLINIC_OR_DEPARTMENT_OTHER)

## 2023-06-15 ENCOUNTER — Other Ambulatory Visit: Payer: Self-pay

## 2023-06-15 ENCOUNTER — Encounter (HOSPITAL_BASED_OUTPATIENT_CLINIC_OR_DEPARTMENT_OTHER): Payer: Self-pay | Admitting: Emergency Medicine

## 2023-06-15 DIAGNOSIS — H538 Other visual disturbances: Secondary | ICD-10-CM | POA: Insufficient documentation

## 2023-06-15 DIAGNOSIS — I10 Essential (primary) hypertension: Secondary | ICD-10-CM | POA: Diagnosis not present

## 2023-06-15 DIAGNOSIS — I672 Cerebral atherosclerosis: Secondary | ICD-10-CM | POA: Diagnosis not present

## 2023-06-15 DIAGNOSIS — Z7984 Long term (current) use of oral hypoglycemic drugs: Secondary | ICD-10-CM | POA: Diagnosis not present

## 2023-06-15 DIAGNOSIS — Z79899 Other long term (current) drug therapy: Secondary | ICD-10-CM | POA: Diagnosis not present

## 2023-06-15 DIAGNOSIS — E1165 Type 2 diabetes mellitus with hyperglycemia: Secondary | ICD-10-CM | POA: Insufficient documentation

## 2023-06-15 DIAGNOSIS — R42 Dizziness and giddiness: Secondary | ICD-10-CM | POA: Diagnosis not present

## 2023-06-15 DIAGNOSIS — N189 Chronic kidney disease, unspecified: Secondary | ICD-10-CM | POA: Insufficient documentation

## 2023-06-15 DIAGNOSIS — R519 Headache, unspecified: Secondary | ICD-10-CM | POA: Diagnosis not present

## 2023-06-15 DIAGNOSIS — E1122 Type 2 diabetes mellitus with diabetic chronic kidney disease: Secondary | ICD-10-CM | POA: Diagnosis not present

## 2023-06-15 DIAGNOSIS — Z7901 Long term (current) use of anticoagulants: Secondary | ICD-10-CM | POA: Diagnosis not present

## 2023-06-15 DIAGNOSIS — N3000 Acute cystitis without hematuria: Secondary | ICD-10-CM | POA: Insufficient documentation

## 2023-06-15 DIAGNOSIS — Z794 Long term (current) use of insulin: Secondary | ICD-10-CM | POA: Diagnosis not present

## 2023-06-15 DIAGNOSIS — H53143 Visual discomfort, bilateral: Secondary | ICD-10-CM | POA: Diagnosis not present

## 2023-06-15 DIAGNOSIS — R739 Hyperglycemia, unspecified: Secondary | ICD-10-CM

## 2023-06-15 LAB — CBC WITH DIFFERENTIAL/PLATELET
Abs Immature Granulocytes: 0.02 10*3/uL (ref 0.00–0.07)
Basophils Absolute: 0 10*3/uL (ref 0.0–0.1)
Basophils Relative: 0 %
Eosinophils Absolute: 0 10*3/uL (ref 0.0–0.5)
Eosinophils Relative: 0 %
HCT: 34.8 % — ABNORMAL LOW (ref 39.0–52.0)
Hemoglobin: 12 g/dL — ABNORMAL LOW (ref 13.0–17.0)
Immature Granulocytes: 0 %
Lymphocytes Relative: 22 %
Lymphs Abs: 1.4 10*3/uL (ref 0.7–4.0)
MCH: 31.3 pg (ref 26.0–34.0)
MCHC: 34.5 g/dL (ref 30.0–36.0)
MCV: 90.6 fL (ref 80.0–100.0)
Monocytes Absolute: 0.5 10*3/uL (ref 0.1–1.0)
Monocytes Relative: 8 %
Neutro Abs: 4.4 10*3/uL (ref 1.7–7.7)
Neutrophils Relative %: 70 %
Platelets: 160 10*3/uL (ref 150–400)
RBC: 3.84 MIL/uL — ABNORMAL LOW (ref 4.22–5.81)
RDW: 12.5 % (ref 11.5–15.5)
WBC: 6.4 10*3/uL (ref 4.0–10.5)
nRBC: 0 % (ref 0.0–0.2)

## 2023-06-15 LAB — COMPREHENSIVE METABOLIC PANEL WITH GFR
ALT: 15 U/L (ref 0–44)
AST: 17 U/L (ref 15–41)
Albumin: 3.4 g/dL — ABNORMAL LOW (ref 3.5–5.0)
Alkaline Phosphatase: 84 U/L (ref 38–126)
Anion gap: 9 (ref 5–15)
BUN: 44 mg/dL — ABNORMAL HIGH (ref 8–23)
CO2: 27 mmol/L (ref 22–32)
Calcium: 8.9 mg/dL (ref 8.9–10.3)
Chloride: 98 mmol/L (ref 98–111)
Creatinine, Ser: 1.73 mg/dL — ABNORMAL HIGH (ref 0.61–1.24)
GFR, Estimated: 39 mL/min — ABNORMAL LOW (ref >60)
Glucose, Bld: 425 mg/dL — ABNORMAL HIGH (ref 70–99)
Potassium: 4.5 mmol/L (ref 3.5–5.1)
Sodium: 134 mmol/L — ABNORMAL LOW (ref 135–145)
Total Bilirubin: 0.6 mg/dL (ref 0.0–1.2)
Total Protein: 6.6 g/dL (ref 6.5–8.1)

## 2023-06-15 LAB — URINALYSIS, MICROSCOPIC (REFLEX)

## 2023-06-15 LAB — URINALYSIS, ROUTINE W REFLEX MICROSCOPIC
Bilirubin Urine: NEGATIVE
Glucose, UA: >=500 mg/dL — AB
Ketones, ur: NEGATIVE mg/dL
Nitrite: NEGATIVE
Protein, ur: NEGATIVE mg/dL
Specific Gravity, Urine: 1.015 (ref 1.005–1.030)
pH: 6 (ref 5.0–8.0)

## 2023-06-15 LAB — CBG MONITORING, ED
Glucose-Capillary: 397 mg/dL — ABNORMAL HIGH (ref 70–99)
Glucose-Capillary: 327 mg/dL — ABNORMAL HIGH (ref 70–99)

## 2023-06-15 LAB — CK: Total CK: 116 U/L (ref 49–397)

## 2023-06-15 MED ORDER — INSULIN ASPART 100 UNIT/ML IJ SOLN
5.0000 [IU] | Freq: Once | INTRAMUSCULAR | Status: AC
  Administered 2023-06-15: 5 [IU] via SUBCUTANEOUS

## 2023-06-15 MED ORDER — SODIUM CHLORIDE 0.9 % IV BOLUS
1000.0000 mL | Freq: Once | INTRAVENOUS | Status: AC
  Administered 2023-06-15: 1000 mL via INTRAVENOUS

## 2023-06-15 MED ORDER — FOSFOMYCIN TROMETHAMINE 3 G PO PACK
3.0000 g | PACK | Freq: Once | ORAL | Status: AC
  Administered 2023-06-15: 3 g via ORAL
  Filled 2023-06-15: qty 3

## 2023-06-15 NOTE — Discharge Instructions (Addendum)
 Please read and follow all provided instructions.  Your diagnoses today include:  1. Blurry vision, bilateral   2. Acute nonintractable headache, unspecified headache type   3. Acute cystitis without hematuria     Tests performed today include: CT head: Did not show any new strokes or other new findings Complete blood cell count: No sign of infection, mild anemia Complete metabolic panel: Blood sugar was high, close to 400 Urinalysis (urine test): Showed sign of infection Urine culture: Pending --you are given a dose of fosfomycin which is an antibiotic for UTI, you were given this last year Vital signs. See below for your results today.   Medications prescribed:  None  Take any prescribed medications only as directed.  Home care instructions:  Follow any educational materials contained in this packet.  BE VERY CAREFUL not to take multiple medicines containing Tylenol  (also called acetaminophen ). Doing so can lead to an overdose which can damage your liver and cause liver failure and possibly death.   Follow-up instructions: Please follow-up with your primary care provider in the next 3 days for further evaluation of your symptoms.   Return instructions:  Please return to the Emergency Department if you experience worsening symptoms. Return if you have weakness in your arms or legs, slurred speech, trouble walking or talking, confusion, or trouble with your balance.  Please return if you have any other emergent concerns.  Additional Information:  Your vital signs today were: BP 138/75   Pulse (!) 47   Temp 98.4 F (36.9 C)   Resp 16   Ht 5\' 5"  (1.651 m)   Wt 82.1 kg   SpO2 99%   BMI 30.12 kg/m  If your blood pressure (BP) was elevated above 135/85 this visit, please have this repeated by your doctor within one month. --------------

## 2023-06-15 NOTE — ED Provider Notes (Signed)
 [facility] Provider Note   CSN: [id number] Arrival date & time: 06/15/23  1138     History  Chief Complaint  Patient presents with   Blurred Vision    Steve Brown is a 83 y.o. male.  Patient with history of stroke in the early 2000's and TIAs, chronic kidney disease, UTI, diabetes, PE on anticoagulation, mild cognitive impairment on Aricept--presents to the emergency department for evaluation of 3 months of generalized body pain which she states goes from his head to his toes and is not particularly worse in 1 area, although he does note sensitivity in the right scalp.  This is a chronic issue for which he has been given lidocaine  in the past.  He also reports "blurred vision".  He describes this as vision not being sharp, bilateral eyes.  This has been also going on for a long period of time.  He cannot tell me the last time that his vision was not blurry.  He denies loss of vision or black spots in his vision.  Patient denies signs of stroke including: facial droop, slurred speech, aphasia, weakness/numbness in extremities, imbalance/trouble walking.  No chest pains or shortness of breath.  No urinary symptoms.  No vomiting or diarrhea.  Patient did have a fall on 6/1 at home.  States that one of his daughters had to come to his house in the middle of the night and help him walk.  He does not think that he hit his head during this fall but his head did "whip around".  Patient was able to drive himself to the emergency department today.       Home Medications Prior to Admission medications   Medication Sig Start Date End Date Taking? Authorizing Provider  cetirizine  (ZYRTEC ) 5 MG tablet Take 2 tablets (10 mg total) by mouth daily. 10/23/22   Beatty, Celeste A, PA-C  Cholecalciferol  (VITAMIN D -1000 MAX ST) 25 MCG (1000 UT) tablet Take 1,000 Units by mouth daily.    [provider]  diclofenac  Sodium (VOLTAREN ) 1 % GEL Apply 4 g  topically 4 (four) times daily. Patient taking differently: Apply 4 g topically 2 (two) times daily as needed (joint pain). 09/03/19   Floyd, Dan, DO  donepezil (ARICEPT) 10 MG tablet Take 10 mg by mouth daily. 11/17/22   [provider]  escitalopram  (LEXAPRO ) 20 MG tablet Take 20 mg by mouth at bedtime.    [provider]  furosemide  (LASIX ) 20 MG tablet Take 1-2 tablets (20-40 mg total) by mouth daily as needed for fluid. Patient taking differently: Take 20 mg by mouth daily as needed for fluid. 06/13/20   Medina-Vargas, Monina C, NP  HYDROcodone -acetaminophen  (NORCO/VICODIN) 5-325 MG tablet Take 1 tablet by mouth daily for severe pain. Patient taking differently: Take 0.5 tablets by mouth as needed. 11/29/22     hydrOXYzine (VISTARIL) 25 MG capsule Take 25 mg by mouth every 8 (eight) hours as needed for anxiety.    [provider]  insulin  aspart (NOVOLOG ) 100 UNIT/ML FlexPen Inject 5 Units into the skin 3 (three) times daily with meals. 06/13/20   Medina-Vargas, Monina C, NP  ipratropium (ATROVENT) 0.06 % nasal spray Place 2 sprays into both nostrils 3 (three) times daily.    [provider]  ketoconazole (NIZORAL) 2 % cream Apply 1 Application topically 2 (two) times daily.    [provider]  losartan  (COZAAR ) 25 MG tablet Take 25 mg by mouth daily.  [provider]  meclizine  (ANTIVERT ) 25 MG tablet Take 1 tablet (25 mg total) by mouth 2 (two) times daily as needed for dizziness. 08/09/22   Onetha Bile, MD  nitroGLYCERIN  (NITROSTAT ) 0.4 MG SL tablet Place 0.4 mg under the tongue every 5 (five) minutes as needed for chest pain.    [provider]  nystatin (MYCOSTATIN/NYSTOP) powder Apply 1 Application topically as needed (irritation).    [provider]  nystatin cream (MYCOSTATIN) Apply 1 Application topically as needed for rash. 06/15/21   [provider]  pregabalin  (LYRICA ) 300 MG capsule Take 300 mg by mouth  2 (two) times daily. 04/26/20   [provider]  rosuvastatin  (CRESTOR ) 20 MG tablet Take 1 tablet (20 mg total) by mouth daily. Please call the office at [phone] to schedule an appointment for further refills. Thank you. 2nd attempt. 01/01/23   Chandrasekhar, Caretha Chapel, MD  TRESIBA  FLEXTOUCH 100 UNIT/ML FlexTouch Pen Inject 34 Units into the skin daily. Patient taking differently: Inject 36 Units into the skin daily. 06/13/20   Medina-Vargas, Monina C, NP  XARELTO  20 MG TABS tablet Take 1 tablet (20 mg total) by mouth daily. 06/13/20   Medina-Vargas, Monina C, NP      Allergies    Other, Phenergan  [promethazine ], Iohexol , Phenergan  [promethazine ], and Tramadol     Review of Systems   Review of Systems  Physical Exam Updated Vital Signs BP 124/82 (BP Location: Right Arm)   Pulse (!) 58   Temp 98.4 F (36.9 C)   Resp 16   Ht 5\' 5"  (1.651 m)   Wt 82.1 kg   SpO2 98%   BMI 30.12 kg/m  Physical Exam Vitals and nursing note reviewed.  Constitutional:      Appearance: He is well-developed.  HENT:     Head: Normocephalic and atraumatic.     Comments: Patient with slight swelling left forehead, chronic, lipoma?    Right Ear: Tympanic membrane, ear canal and external ear normal.     Left Ear: Tympanic membrane, ear canal and external ear normal.     Nose: Nose normal.     Mouth/Throat:     Mouth: Mucous membranes are moist.     Pharynx: Uvula midline.  Eyes:     General: Lids are normal.     Conjunctiva/sclera: Conjunctivae normal.     Pupils: Pupils are equal, round, and reactive to light.     Comments: Arcus senilis noted.  Pupils are constricted.  Cardiovascular:     Rate and Rhythm: Normal rate and regular rhythm.     Comments: No murmur auscultated regular rhythm Pulmonary:     Effort: Pulmonary effort is normal.     Breath sounds: Normal breath sounds.  Abdominal:     Palpations: Abdomen is soft.     Tenderness: There is no abdominal tenderness. There is no  guarding or rebound.  Musculoskeletal:        General: Normal range of motion.     Cervical back: Normal range of motion and neck supple. No tenderness or bony tenderness.  Skin:    General: Skin is warm and dry.  Neurological:     Mental Status: He is alert and oriented to person, place, and time.     GCS: GCS eye subscore is 4. GCS verbal subscore is 5. GCS motor subscore is 6.     Cranial Nerves: No cranial nerve deficit.     Sensory: No sensory deficit.     Motor: No abnormal  muscle tone.     Coordination: Coordination normal.     ED Results / Procedures / Treatments   Labs (all labs ordered are listed, but only abnormal results are displayed) Labs Reviewed  CBC WITH DIFFERENTIAL/PLATELET - Abnormal; Notable for the following components:      Result Value   RBC 3.84 (*)    Hemoglobin 12.0 (*)    HCT 34.8 (*)    All other components within normal limits  COMPREHENSIVE METABOLIC PANEL WITH GFR - Abnormal; Notable for the following components:   Sodium 134 (*)    Glucose, Bld 425 (*)    BUN 44 (*)    Creatinine, Ser 1.73 (*)    Albumin 3.4 (*)    GFR, Estimated 39 (*)    All other components within normal limits  URINALYSIS, ROUTINE W REFLEX MICROSCOPIC - Abnormal; Notable for the following components:   APPearance CLOUDY (*)    Glucose, UA >=500 (*)    Hgb urine dipstick MODERATE (*)    Leukocytes,Ua SMALL (*)    All other components within normal limits  URINALYSIS, MICROSCOPIC (REFLEX) - Abnormal; Notable for the following components:   Bacteria, UA MANY (*)    All other components within normal limits  CBG MONITORING, ED - Abnormal; Notable for the following components:   Glucose-Capillary 397 (*)    All other components within normal limits  CBG MONITORING, ED - Abnormal; Notable for the following components:   Glucose-Capillary 327 (*)    All other components within normal limits  URINE CULTURE  CK    EKG EKG Interpretation Date/Time:  Friday June 15 2023  11:47:49 EDT Ventricular Rate:  57 PR Interval:  152 QRS Duration:  91 QT Interval:  431 QTC Calculation: 420 R Axis:   -13  Text Interpretation: Sinus rhythm Low voltage, precordial leads Abnormal R-wave progression, early transition Confirmed by Jerald Molly [phone]) on 06/15/2023 11:49:38 AM  Radiology CT Head Wo Contrast Result Date: 06/15/2023 CLINICAL DATA:  83 year old male with headache and blurred vision for 3 months. Dizziness. EXAM: CT HEAD WITHOUT CONTRAST TECHNIQUE: Contiguous axial images were obtained from the base of the skull through the vertex without intravenous contrast. RADIATION DOSE REDUCTION: This exam was performed according to the departmental dose-optimization program which includes automated exposure control, adjustment of the mA and/or kV according to patient size and/or use of iterative reconstruction technique. COMPARISON:  Brain MRI 12/03/2022.  Head CT 12/02/2022. FINDINGS: Brain: Cerebral volume is stable from last year, within normal limits for age. No midline shift, ventriculomegaly, mass effect, evidence of mass lesion, intracranial hemorrhage or evidence of cortically based acute infarction. Small area of chronic left middle frontal gyrus encephalomalacia is stable on series 2, image 19. Otherwise maintained gray-white differentiation in both hemispheres. Vascular: No suspicious intracranial vascular hyperdensity. Calcified atherosclerosis at the skull base. Skull: Stable and intact.  No acute osseous abnormality identified. Sinuses/Orbits: Chronic left frontal ethmoidal, left ethmoid sinusitis and contralateral right sphenoid sinusitis with mucoperiosteal thickening appears unchanged from last year. Tympanic cavities and mastoids remain well aerated. Other: Benign left anterior scalp convexity lipoma series 4, image 23. No acute orbit or scalp soft tissue finding. Chronic postoperative changes to both globes. IMPRESSION: 1. No acute intracranial abnormality. 2.  Stable non contrast CT appearance of the brain from last year; small chronic cortical infarct of the left middle frontal gyrus. Calcified skull base atherosclerosis. 3. Chronic ethmoid and sphenoid sinusitis. Electronically Signed   By: Arline Laity.D.  On: 06/15/2023 14:14    Procedures Procedures    Medications Ordered in ED Medications  fosfomycin (MONUROL ) packet 3 g (has no administration in time range)  sodium chloride  0.9 % bolus 1,000 mL (1,000 mLs Intravenous New Bag/Given 06/15/23 1356)  insulin  aspart (novoLOG ) injection 5 Units (5 Units Subcutaneous Given 06/15/23 1448)    ED Course/ Medical Decision Making/ A&P    Patient seen and examined. History obtained directly from patient.  Reviewed neurology notes with Dr. Albertina Hugger from April 2025.   Labs/EKG: Ordered CBC, CMP, UA, CK.  EKG personally reviewed and interpreted as above.  Imaging: None ordered but will obtain head imaging after kidney function known.  Medications/Fluids: None ordered  Most recent vital signs reviewed and are as follows: BP 124/82 (BP Location: Right Arm)   Pulse (!) 58   Temp 98.4 F (36.9 C)   Resp 16   Ht 5\' 5"  (1.651 m)   Wt 82.1 kg   SpO2 98%   BMI 30.12 kg/m   Initial impression: Patient with multiple comorbidities presents today with some chronic symptoms.  Nothing seems to be acutely worse.  I have low concern for acute stroke given his history.  Body pain is generalized and nonfocal.  Unclear etiology.  He has had scalp sensitivity for months.  Blurry vision has also been present for a while, denies unilateral symptoms or vision loss.  Patient was able to drive successfully to the emergency department today.  Patient discussed with Dr. Gordon Latus who will see as well.  3:45 PM Reassessment performed. Patient appears stable.  Labs personally reviewed and interpreted including: CBC with normal white blood cell count, hemoglobin 12.0; CMP sodium was 134, normal potassium, glucose elevated at  425 with normal anion gap, creatinine and BUN above baseline of CKD with creatinine 1.73 and a BUN of 44; CK was normal; UA with signs of UTI, clean-catch with 21-50 white blood cells, white blood cell clumps, many bacteria.  Imaging personally visualized and interpreted including: CT head, agree no acute findings.  Reviewed pertinent lab work and imaging with patient at bedside. Questions answered.   Most current vital signs reviewed and are as follows: BP 138/75   Pulse (!) 47   Temp 98.4 F (36.9 C)   Resp 16   Ht 5\' 5"  (1.651 m)   Wt 82.1 kg   SpO2 99%   BMI 30.12 kg/m   Plan: Patient discussed with and seen by Dr. Gordon Latus earlier.  We both feel that symptoms are very chronic in nature.  No acute or concerning findings today.  We will treat elevated blood sugar with IV fluid bolus and 5 units SQ insulin  and recheck.  Patient does have a history of previous UTI.  Most recent urine culture from 2024 grew out ESBL E. coli.  Patient was initially on oral cephalosporin with and it was ultimately changed to fosfomycin.  Patient without any significant UTI symptoms today or fevers, flank pain to suggest pyelonephritis.  However infection could be exacerbating hyperglycemia which could affect his vision as well.  Feel that it would be reasonable to treat this.  Do not feel patient requires admission, but will give a dose of fosfomycin and send urine culture.  Otherwise plan for discharge to home.  Patient in agreement.  Strongly encouraged PCP follow-up next week for recheck.  4:08 PM CBG improved to 327, patient given dose of fosfomycin, urine culture sent.  Most current vital signs reviewed and are as follows: BP 138/75  Pulse (!) 47   Temp 98.4 F (36.9 C)   Resp 16   Ht 5\' 5"  (1.651 m)   Wt 82.1 kg   SpO2 99%   BMI 30.12 kg/m   Plan: Discharge to home.   Prescriptions written for: None  Other home care instructions discussed: Continue close monitoring of blood sugar  ED  return instructions discussed: Patient counseled to return if they have weakness in their arms or legs, slurred speech, trouble walking or talking, confusion, trouble with their balance, or if they have any other concerns. Patient verbalizes understanding and agrees with plan.   Follow-up instructions discussed: Patient encouraged to follow-up with their PCP in 3 days.                                 Medical Decision Making Amount and/or Complexity of Data Reviewed Labs: ordered. Radiology: ordered.  Risk Prescription drug management.   Patient presents today with multiple symptoms, seem very chronic in nature.  Patient has blurry vision.  Head CT negative for acute stroke.  No loss of vision.  Symptoms are bilateral.  Hyperglycemia noted as below.  No other focal neurodeficits.  Do not feel that patient requires transfer for MRI at this time.  As long as symptoms are stable, he can follow-up for this as outpatient.  Head pain that is not a true headache.  More sensitivity on the right occipital and parietal areas.  No temporal tenderness to suggest temporal arteritis.  Head CT was negative.  No focal neurodeficits.  He has seen neurology for this previously.  Hyperglycemia without ketosis.  This was treated with IV fluids and subcutaneous insulin .  May be contributing to blurry vision.  UTI, history of ESBL E. coli infection.  Do not suspect sepsis at this time.  Do not suspect pyelonephritis.  Normal white blood cell count.  Urine culture sent and patient was given a dose of fosfomycin.  Generalized body pain without focality.  No signs of cellulitis, abscess, septic arthritis.  Again, normal white blood cell count.  CK was normal.        Final Clinical Impression(s) / ED Diagnoses Final diagnoses:  Blurry vision, bilateral  Acute nonintractable headache, unspecified headache type  Acute cystitis without hematuria  Hyperglycemia    Rx / DC Orders ED Discharge Orders      None         Lyna Sandhoff, PA-C 06/15/23 1611    Arvilla Birmingham, MD 06/16/23 [phone]

## 2023-06-15 NOTE — ED Notes (Signed)
 Pt. Reports that he has had issues with his blood sugar and did take his insulin .

## 2023-06-15 NOTE — ED Triage Notes (Signed)
 Blurry vision x 3 months pt did state he drove himself here, has a head  ache rt side also for 3 months  saw the [place] last  Tuesday for all given lido patches for soreness in his head  has had dizziness  no nausea or vomiting

## 2023-06-15 NOTE — ED Notes (Signed)
Pt. Is in CT

## 2023-06-17 LAB — URINE CULTURE: Culture: >=100000 — AB

## 2023-06-18 ENCOUNTER — Telehealth (HOSPITAL_BASED_OUTPATIENT_CLINIC_OR_DEPARTMENT_OTHER): Payer: Self-pay | Admitting: *Deleted

## 2023-06-18 NOTE — Telephone Encounter (Signed)
 Post ED Visit - Positive Culture Follow-up  Culture report reviewed by antimicrobial stewardship pharmacist: [facility] [x]  [place], [place].D. []  Skeet Duke, Pharm.D., BCPS AQ-ID []  Leslee Rase, Pharm.D., BCPS []  Garland Junk, Pharm.D., BCPS []  [place], [place].D., BCPS, AAHIVP []  Alcide Aly, Pharm.D., BCPS, AAHIVP []  Jerri Morale, PharmD, BCPS []  Hoston Laws, PharmD, BCPS []  Cleda Curly, PharmD, BCPS []  Tamar Fairly, PharmD []  Ballard Levels, PharmD, BCPS []  Ollen Beverage, PharmD  [facility] []  Arlyne Bering, PharmD []  Sherryle Don, PharmD []  Van Gelinas, PharmD []  Delila Felty, Rph []  [place]) Cleora Daft, PharmD []  Augustina Block, PharmD []  Arie Kurtz, PharmD []  Sharlyn Deaner, PharmD []  Agnes Hose, PharmD []  Kendall Pauls, PharmD []  Gladstone Lamer, PharmD []  Armanda Bern, PharmD []  Tera Fellows, PharmD   Positive urine culture Treated with Fosfomycin X 1 in the ED, organism sensitive to the same and no further patient follow-up is required at this time.  Lovell Rubenstein Sharion Davidson 06/18/2023, 1:09 PM

## 2023-06-25 DIAGNOSIS — N390 Urinary tract infection, site not specified: Secondary | ICD-10-CM | POA: Diagnosis not present

## 2023-06-25 DIAGNOSIS — Z6831 Body mass index (BMI) 31.0-31.9, adult: Secondary | ICD-10-CM | POA: Diagnosis not present

## 2023-06-25 DIAGNOSIS — R519 Headache, unspecified: Secondary | ICD-10-CM | POA: Diagnosis not present

## 2023-06-25 DIAGNOSIS — R42 Dizziness and giddiness: Secondary | ICD-10-CM | POA: Diagnosis not present

## 2023-06-25 DIAGNOSIS — Z09 Encounter for follow-up examination after completed treatment for conditions other than malignant neoplasm: Secondary | ICD-10-CM | POA: Diagnosis not present

## 2023-06-25 DIAGNOSIS — M542 Cervicalgia: Secondary | ICD-10-CM | POA: Diagnosis not present

## 2023-06-26 ENCOUNTER — Other Ambulatory Visit: Payer: Self-pay

## 2023-06-26 DIAGNOSIS — M545 Low back pain, unspecified: Secondary | ICD-10-CM | POA: Diagnosis not present

## 2023-06-26 DIAGNOSIS — M47816 Spondylosis without myelopathy or radiculopathy, lumbar region: Secondary | ICD-10-CM | POA: Diagnosis not present

## 2023-06-26 DIAGNOSIS — M5116 Intervertebral disc disorders with radiculopathy, lumbar region: Secondary | ICD-10-CM | POA: Diagnosis not present

## 2023-06-26 DIAGNOSIS — G8929 Other chronic pain: Secondary | ICD-10-CM | POA: Diagnosis not present

## 2023-06-26 DIAGNOSIS — Z79891 Long term (current) use of opiate analgesic: Secondary | ICD-10-CM | POA: Diagnosis not present

## 2023-06-26 MED ORDER — HYDROCODONE-ACETAMINOPHEN 5-325 MG PO TABS
1.0000 | ORAL_TABLET | Freq: Every day | ORAL | 0 refills | Status: DC | PRN
  Filled 2023-06-26: qty 30, 30d supply, fill #0

## 2023-06-27 DIAGNOSIS — R270 Ataxia, unspecified: Secondary | ICD-10-CM | POA: Diagnosis not present

## 2023-06-27 DIAGNOSIS — M542 Cervicalgia: Secondary | ICD-10-CM | POA: Diagnosis not present

## 2023-06-27 DIAGNOSIS — R2681 Unsteadiness on feet: Secondary | ICD-10-CM | POA: Diagnosis not present

## 2023-06-29 DIAGNOSIS — Z09 Encounter for follow-up examination after completed treatment for conditions other than malignant neoplasm: Secondary | ICD-10-CM | POA: Diagnosis not present

## 2023-06-29 DIAGNOSIS — N390 Urinary tract infection, site not specified: Secondary | ICD-10-CM | POA: Diagnosis not present

## 2023-07-01 DIAGNOSIS — E1140 Type 2 diabetes mellitus with diabetic neuropathy, unspecified: Secondary | ICD-10-CM | POA: Diagnosis not present

## 2023-07-01 DIAGNOSIS — N183 Chronic kidney disease, stage 3 unspecified: Secondary | ICD-10-CM | POA: Diagnosis not present

## 2023-07-01 DIAGNOSIS — I10 Essential (primary) hypertension: Secondary | ICD-10-CM | POA: Diagnosis not present

## 2023-07-02 DIAGNOSIS — M5416 Radiculopathy, lumbar region: Secondary | ICD-10-CM | POA: Diagnosis not present

## 2023-07-03 DIAGNOSIS — I10 Essential (primary) hypertension: Secondary | ICD-10-CM | POA: Diagnosis not present

## 2023-07-03 DIAGNOSIS — E7800 Pure hypercholesterolemia, unspecified: Secondary | ICD-10-CM | POA: Diagnosis not present

## 2023-07-03 DIAGNOSIS — C61 Malignant neoplasm of prostate: Secondary | ICD-10-CM | POA: Diagnosis not present

## 2023-07-03 DIAGNOSIS — I639 Cerebral infarction, unspecified: Secondary | ICD-10-CM | POA: Diagnosis not present

## 2023-07-03 DIAGNOSIS — N189 Chronic kidney disease, unspecified: Secondary | ICD-10-CM | POA: Diagnosis not present

## 2023-07-03 DIAGNOSIS — E669 Obesity, unspecified: Secondary | ICD-10-CM | POA: Diagnosis not present

## 2023-07-03 DIAGNOSIS — G609 Hereditary and idiopathic neuropathy, unspecified: Secondary | ICD-10-CM | POA: Diagnosis not present

## 2023-07-03 DIAGNOSIS — E1165 Type 2 diabetes mellitus with hyperglycemia: Secondary | ICD-10-CM | POA: Diagnosis not present

## 2023-07-05 DIAGNOSIS — R2681 Unsteadiness on feet: Secondary | ICD-10-CM | POA: Diagnosis not present

## 2023-07-05 DIAGNOSIS — R270 Ataxia, unspecified: Secondary | ICD-10-CM | POA: Diagnosis not present

## 2023-07-05 DIAGNOSIS — M542 Cervicalgia: Secondary | ICD-10-CM | POA: Diagnosis not present

## 2023-07-07 ENCOUNTER — Encounter (HOSPITAL_COMMUNITY): Payer: Self-pay | Admitting: Interventional Radiology

## 2023-07-09 DIAGNOSIS — I10 Essential (primary) hypertension: Secondary | ICD-10-CM | POA: Diagnosis not present

## 2023-07-09 DIAGNOSIS — N183 Chronic kidney disease, stage 3 unspecified: Secondary | ICD-10-CM | POA: Diagnosis not present

## 2023-07-09 DIAGNOSIS — E782 Mixed hyperlipidemia: Secondary | ICD-10-CM | POA: Diagnosis not present

## 2023-07-09 DIAGNOSIS — E1140 Type 2 diabetes mellitus with diabetic neuropathy, unspecified: Secondary | ICD-10-CM | POA: Diagnosis not present

## 2023-07-11 DIAGNOSIS — R270 Ataxia, unspecified: Secondary | ICD-10-CM | POA: Diagnosis not present

## 2023-07-11 DIAGNOSIS — M542 Cervicalgia: Secondary | ICD-10-CM | POA: Diagnosis not present

## 2023-07-11 DIAGNOSIS — R2681 Unsteadiness on feet: Secondary | ICD-10-CM | POA: Diagnosis not present

## 2023-07-18 DIAGNOSIS — R2681 Unsteadiness on feet: Secondary | ICD-10-CM | POA: Diagnosis not present

## 2023-07-18 DIAGNOSIS — M542 Cervicalgia: Secondary | ICD-10-CM | POA: Diagnosis not present

## 2023-07-18 DIAGNOSIS — R270 Ataxia, unspecified: Secondary | ICD-10-CM | POA: Diagnosis not present

## 2023-07-24 ENCOUNTER — Other Ambulatory Visit: Payer: Self-pay

## 2023-07-24 DIAGNOSIS — N390 Urinary tract infection, site not specified: Secondary | ICD-10-CM | POA: Diagnosis not present

## 2023-07-24 DIAGNOSIS — M47816 Spondylosis without myelopathy or radiculopathy, lumbar region: Secondary | ICD-10-CM | POA: Diagnosis not present

## 2023-07-24 DIAGNOSIS — M545 Low back pain, unspecified: Secondary | ICD-10-CM | POA: Diagnosis not present

## 2023-07-24 DIAGNOSIS — N183 Chronic kidney disease, stage 3 unspecified: Secondary | ICD-10-CM | POA: Diagnosis not present

## 2023-07-24 DIAGNOSIS — M5116 Intervertebral disc disorders with radiculopathy, lumbar region: Secondary | ICD-10-CM | POA: Diagnosis not present

## 2023-07-24 DIAGNOSIS — M1612 Unilateral primary osteoarthritis, left hip: Secondary | ICD-10-CM | POA: Diagnosis not present

## 2023-07-24 DIAGNOSIS — Z79891 Long term (current) use of opiate analgesic: Secondary | ICD-10-CM | POA: Diagnosis not present

## 2023-07-24 DIAGNOSIS — M25552 Pain in left hip: Secondary | ICD-10-CM | POA: Diagnosis not present

## 2023-07-24 DIAGNOSIS — G8929 Other chronic pain: Secondary | ICD-10-CM | POA: Diagnosis not present

## 2023-07-24 DIAGNOSIS — Z09 Encounter for follow-up examination after completed treatment for conditions other than malignant neoplasm: Secondary | ICD-10-CM | POA: Diagnosis not present

## 2023-07-24 DIAGNOSIS — Z6832 Body mass index (BMI) 32.0-32.9, adult: Secondary | ICD-10-CM | POA: Diagnosis not present

## 2023-07-24 MED ORDER — HYDROCODONE-ACETAMINOPHEN 5-325 MG PO TABS
1.0000 | ORAL_TABLET | Freq: Every day | ORAL | 0 refills | Status: DC
  Filled 2023-07-24: qty 30, 30d supply, fill #0

## 2023-07-27 ENCOUNTER — Ambulatory Visit: Admitting: Podiatry

## 2023-07-27 DIAGNOSIS — B351 Tinea unguium: Secondary | ICD-10-CM

## 2023-07-27 DIAGNOSIS — M79672 Pain in left foot: Secondary | ICD-10-CM | POA: Diagnosis not present

## 2023-07-27 DIAGNOSIS — E1151 Type 2 diabetes mellitus with diabetic peripheral angiopathy without gangrene: Secondary | ICD-10-CM

## 2023-07-27 DIAGNOSIS — M79671 Pain in right foot: Secondary | ICD-10-CM

## 2023-07-27 DIAGNOSIS — I70209 Unspecified atherosclerosis of native arteries of extremities, unspecified extremity: Secondary | ICD-10-CM | POA: Diagnosis not present

## 2023-07-27 MED ORDER — KETOCONAZOLE 2 % EX CREA
1.0000 | TOPICAL_CREAM | Freq: Every day | CUTANEOUS | 9 refills | Status: DC
Start: 2023-07-27 — End: 2023-09-29

## 2023-07-27 NOTE — Progress Notes (Signed)
 Patient presents for evaluation and treatment of tenderness and some redness around nails feet.  Tenderness around toes with walking and wearing shoes.  Physical exam:  General appearance: Alert, pleasant, and in no acute distress.  Vascular: Pedal pulses: DP 2/4 B/L, PT 1/4 B/L.  Moderate edema lower legs bilaterally.  Neurological:    Dermatologic:  Nails thickened, disfigured, discolored 1-5 BL with subungual debris.  Redness and hypertrophic nail folds along nail folds bilaterally but no signs of drainage or infection.  Musculoskeletal:     Diagnosis: 1. Painful onychomycotic nails 1 through 5 bilaterally. 2. Pain toes 1 through 5 bilaterally. 3.  Diabetes mellitus type 2 with PVD  Plan: Debrided onychomycotic nails 1 through 5 bilaterally.  Return 3 months

## 2023-07-27 NOTE — Addendum Note (Signed)
 Addended by: CHRISTINE NORLEEN LABOR on: 07/27/2023 08:45 AM   Modules accepted: Orders

## 2023-07-31 DIAGNOSIS — I639 Cerebral infarction, unspecified: Secondary | ICD-10-CM | POA: Diagnosis not present

## 2023-07-31 DIAGNOSIS — I10 Essential (primary) hypertension: Secondary | ICD-10-CM | POA: Diagnosis not present

## 2023-07-31 DIAGNOSIS — E7800 Pure hypercholesterolemia, unspecified: Secondary | ICD-10-CM | POA: Diagnosis not present

## 2023-07-31 DIAGNOSIS — E1165 Type 2 diabetes mellitus with hyperglycemia: Secondary | ICD-10-CM | POA: Diagnosis not present

## 2023-07-31 DIAGNOSIS — C61 Malignant neoplasm of prostate: Secondary | ICD-10-CM | POA: Diagnosis not present

## 2023-07-31 DIAGNOSIS — N189 Chronic kidney disease, unspecified: Secondary | ICD-10-CM | POA: Diagnosis not present

## 2023-07-31 DIAGNOSIS — E1140 Type 2 diabetes mellitus with diabetic neuropathy, unspecified: Secondary | ICD-10-CM | POA: Diagnosis not present

## 2023-07-31 DIAGNOSIS — E669 Obesity, unspecified: Secondary | ICD-10-CM | POA: Diagnosis not present

## 2023-07-31 DIAGNOSIS — N183 Chronic kidney disease, stage 3 unspecified: Secondary | ICD-10-CM | POA: Diagnosis not present

## 2023-07-31 DIAGNOSIS — G609 Hereditary and idiopathic neuropathy, unspecified: Secondary | ICD-10-CM | POA: Diagnosis not present

## 2023-08-01 DIAGNOSIS — M542 Cervicalgia: Secondary | ICD-10-CM | POA: Diagnosis not present

## 2023-08-01 DIAGNOSIS — M25552 Pain in left hip: Secondary | ICD-10-CM | POA: Diagnosis not present

## 2023-08-01 DIAGNOSIS — R2681 Unsteadiness on feet: Secondary | ICD-10-CM | POA: Diagnosis not present

## 2023-08-01 DIAGNOSIS — R270 Ataxia, unspecified: Secondary | ICD-10-CM | POA: Diagnosis not present

## 2023-08-09 DIAGNOSIS — I10 Essential (primary) hypertension: Secondary | ICD-10-CM | POA: Diagnosis not present

## 2023-08-09 DIAGNOSIS — E782 Mixed hyperlipidemia: Secondary | ICD-10-CM | POA: Diagnosis not present

## 2023-08-09 DIAGNOSIS — E1140 Type 2 diabetes mellitus with diabetic neuropathy, unspecified: Secondary | ICD-10-CM | POA: Diagnosis not present

## 2023-08-09 DIAGNOSIS — N183 Chronic kidney disease, stage 3 unspecified: Secondary | ICD-10-CM | POA: Diagnosis not present

## 2023-08-13 DIAGNOSIS — Z8546 Personal history of malignant neoplasm of prostate: Secondary | ICD-10-CM | POA: Diagnosis not present

## 2023-08-29 DIAGNOSIS — M542 Cervicalgia: Secondary | ICD-10-CM | POA: Diagnosis not present

## 2023-08-29 DIAGNOSIS — R2681 Unsteadiness on feet: Secondary | ICD-10-CM | POA: Diagnosis not present

## 2023-08-29 DIAGNOSIS — R270 Ataxia, unspecified: Secondary | ICD-10-CM | POA: Diagnosis not present

## 2023-08-30 DIAGNOSIS — N183 Chronic kidney disease, stage 3 unspecified: Secondary | ICD-10-CM | POA: Diagnosis not present

## 2023-08-30 DIAGNOSIS — I10 Essential (primary) hypertension: Secondary | ICD-10-CM | POA: Diagnosis not present

## 2023-08-30 DIAGNOSIS — E1140 Type 2 diabetes mellitus with diabetic neuropathy, unspecified: Secondary | ICD-10-CM | POA: Diagnosis not present

## 2023-09-09 DIAGNOSIS — N183 Chronic kidney disease, stage 3 unspecified: Secondary | ICD-10-CM | POA: Diagnosis not present

## 2023-09-09 DIAGNOSIS — E1140 Type 2 diabetes mellitus with diabetic neuropathy, unspecified: Secondary | ICD-10-CM | POA: Diagnosis not present

## 2023-09-09 DIAGNOSIS — E782 Mixed hyperlipidemia: Secondary | ICD-10-CM | POA: Diagnosis not present

## 2023-09-09 DIAGNOSIS — I10 Essential (primary) hypertension: Secondary | ICD-10-CM | POA: Diagnosis not present

## 2023-09-13 DIAGNOSIS — R270 Ataxia, unspecified: Secondary | ICD-10-CM | POA: Diagnosis not present

## 2023-09-13 DIAGNOSIS — M542 Cervicalgia: Secondary | ICD-10-CM | POA: Diagnosis not present

## 2023-09-13 DIAGNOSIS — R2681 Unsteadiness on feet: Secondary | ICD-10-CM | POA: Diagnosis not present

## 2023-09-19 DIAGNOSIS — M47816 Spondylosis without myelopathy or radiculopathy, lumbar region: Secondary | ICD-10-CM | POA: Diagnosis not present

## 2023-09-19 DIAGNOSIS — M5116 Intervertebral disc disorders with radiculopathy, lumbar region: Secondary | ICD-10-CM | POA: Diagnosis not present

## 2023-09-19 DIAGNOSIS — G8929 Other chronic pain: Secondary | ICD-10-CM | POA: Diagnosis not present

## 2023-09-19 DIAGNOSIS — M533 Sacrococcygeal disorders, not elsewhere classified: Secondary | ICD-10-CM | POA: Diagnosis not present

## 2023-09-19 DIAGNOSIS — M545 Low back pain, unspecified: Secondary | ICD-10-CM | POA: Diagnosis not present

## 2023-09-19 DIAGNOSIS — M1612 Unilateral primary osteoarthritis, left hip: Secondary | ICD-10-CM | POA: Diagnosis not present

## 2023-09-19 DIAGNOSIS — M25552 Pain in left hip: Secondary | ICD-10-CM | POA: Diagnosis not present

## 2023-09-19 DIAGNOSIS — Z79891 Long term (current) use of opiate analgesic: Secondary | ICD-10-CM | POA: Diagnosis not present

## 2023-09-28 ENCOUNTER — Other Ambulatory Visit: Payer: Self-pay

## 2023-09-28 ENCOUNTER — Observation Stay (HOSPITAL_BASED_OUTPATIENT_CLINIC_OR_DEPARTMENT_OTHER)
Admission: EM | Admit: 2023-09-28 | Discharge: 2023-09-30 | Disposition: A | Discharge status: Home / Self Care | Attending: Student | Admitting: Student

## 2023-09-28 ENCOUNTER — Encounter (HOSPITAL_BASED_OUTPATIENT_CLINIC_OR_DEPARTMENT_OTHER): Payer: Self-pay

## 2023-09-28 ENCOUNTER — Emergency Department (HOSPITAL_BASED_OUTPATIENT_CLINIC_OR_DEPARTMENT_OTHER)

## 2023-09-28 DIAGNOSIS — I6782 Cerebral ischemia: Secondary | ICD-10-CM | POA: Diagnosis not present

## 2023-09-28 DIAGNOSIS — N400 Enlarged prostate without lower urinary tract symptoms: Secondary | ICD-10-CM | POA: Diagnosis not present

## 2023-09-28 DIAGNOSIS — I2510 Atherosclerotic heart disease of native coronary artery without angina pectoris: Secondary | ICD-10-CM | POA: Diagnosis not present

## 2023-09-28 DIAGNOSIS — Z79899 Other long term (current) drug therapy: Secondary | ICD-10-CM | POA: Diagnosis not present

## 2023-09-28 DIAGNOSIS — E118 Type 2 diabetes mellitus with unspecified complications: Secondary | ICD-10-CM

## 2023-09-28 DIAGNOSIS — Z86711 Personal history of pulmonary embolism: Secondary | ICD-10-CM | POA: Insufficient documentation

## 2023-09-28 DIAGNOSIS — G4733 Obstructive sleep apnea (adult) (pediatric): Secondary | ICD-10-CM | POA: Diagnosis present

## 2023-09-28 DIAGNOSIS — J323 Chronic sphenoidal sinusitis: Secondary | ICD-10-CM | POA: Diagnosis not present

## 2023-09-28 DIAGNOSIS — E1165 Type 2 diabetes mellitus with hyperglycemia: Secondary | ICD-10-CM | POA: Diagnosis not present

## 2023-09-28 DIAGNOSIS — I130 Hypertensive heart and chronic kidney disease with heart failure and stage 1 through stage 4 chronic kidney disease, or unspecified chronic kidney disease: Secondary | ICD-10-CM | POA: Insufficient documentation

## 2023-09-28 DIAGNOSIS — N390 Urinary tract infection, site not specified: Secondary | ICD-10-CM | POA: Diagnosis not present

## 2023-09-28 DIAGNOSIS — Z7901 Long term (current) use of anticoagulants: Secondary | ICD-10-CM | POA: Insufficient documentation

## 2023-09-28 DIAGNOSIS — R319 Hematuria, unspecified: Secondary | ICD-10-CM | POA: Diagnosis not present

## 2023-09-28 DIAGNOSIS — I5032 Chronic diastolic (congestive) heart failure: Secondary | ICD-10-CM | POA: Insufficient documentation

## 2023-09-28 DIAGNOSIS — E1122 Type 2 diabetes mellitus with diabetic chronic kidney disease: Secondary | ICD-10-CM | POA: Insufficient documentation

## 2023-09-28 DIAGNOSIS — Z794 Long term (current) use of insulin: Secondary | ICD-10-CM | POA: Diagnosis not present

## 2023-09-28 DIAGNOSIS — R531 Weakness: Secondary | ICD-10-CM | POA: Insufficient documentation

## 2023-09-28 DIAGNOSIS — I129 Hypertensive chronic kidney disease with stage 1 through stage 4 chronic kidney disease, or unspecified chronic kidney disease: Secondary | ICD-10-CM | POA: Insufficient documentation

## 2023-09-28 DIAGNOSIS — M4802 Spinal stenosis, cervical region: Secondary | ICD-10-CM | POA: Insufficient documentation

## 2023-09-28 DIAGNOSIS — R202 Paresthesia of skin: Principal | ICD-10-CM | POA: Insufficient documentation

## 2023-09-28 DIAGNOSIS — J329 Chronic sinusitis, unspecified: Secondary | ICD-10-CM | POA: Insufficient documentation

## 2023-09-28 DIAGNOSIS — M545 Low back pain, unspecified: Secondary | ICD-10-CM | POA: Insufficient documentation

## 2023-09-28 DIAGNOSIS — N1831 Chronic kidney disease, stage 3a: Secondary | ICD-10-CM | POA: Insufficient documentation

## 2023-09-28 DIAGNOSIS — N3000 Acute cystitis without hematuria: Secondary | ICD-10-CM | POA: Insufficient documentation

## 2023-09-28 DIAGNOSIS — Z743 Need for continuous supervision: Secondary | ICD-10-CM | POA: Diagnosis not present

## 2023-09-28 DIAGNOSIS — R200 Anesthesia of skin: Principal | ICD-10-CM | POA: Diagnosis present

## 2023-09-28 DIAGNOSIS — I10 Essential (primary) hypertension: Secondary | ICD-10-CM | POA: Diagnosis not present

## 2023-09-28 DIAGNOSIS — N179 Acute kidney failure, unspecified: Secondary | ICD-10-CM | POA: Insufficient documentation

## 2023-09-28 DIAGNOSIS — Z8673 Personal history of transient ischemic attack (TIA), and cerebral infarction without residual deficits: Secondary | ICD-10-CM | POA: Diagnosis not present

## 2023-09-28 DIAGNOSIS — G8929 Other chronic pain: Secondary | ICD-10-CM | POA: Diagnosis not present

## 2023-09-28 DIAGNOSIS — R29810 Facial weakness: Secondary | ICD-10-CM | POA: Diagnosis present

## 2023-09-28 LAB — COMPREHENSIVE METABOLIC PANEL WITH GFR
ALT: 20 U/L (ref 0–44)
AST: 28 U/L (ref 15–41)
Albumin: 3.8 g/dL (ref 3.5–5.0)
Alkaline Phosphatase: 107 U/L (ref 38–126)
Anion gap: 7 (ref 5–15)
BUN: 32 mg/dL — ABNORMAL HIGH (ref 8–23)
CO2: 27 mmol/L (ref 22–32)
Calcium: 8.4 mg/dL — ABNORMAL LOW (ref 8.9–10.3)
Chloride: 103 mmol/L (ref 98–111)
Creatinine, Ser: 2.17 mg/dL — ABNORMAL HIGH (ref 0.61–1.24)
GFR, Estimated: 30 mL/min — ABNORMAL LOW (ref >60)
Glucose, Bld: 267 mg/dL — ABNORMAL HIGH (ref 70–99)
Potassium: 4.7 mmol/L (ref 3.5–5.1)
Sodium: 137 mmol/L (ref 135–145)
Total Bilirubin: 0.5 mg/dL (ref 0.0–1.2)
Total Protein: 6.5 g/dL (ref 6.5–8.1)

## 2023-09-28 LAB — URINALYSIS, ROUTINE W REFLEX MICROSCOPIC
Bilirubin Urine: NEGATIVE
Glucose, UA: NEGATIVE mg/dL
Ketones, ur: NEGATIVE mg/dL
Nitrite: POSITIVE — AB
Protein, ur: 100 mg/dL — AB
Specific Gravity, Urine: 1.015 (ref 1.005–1.030)
pH: 6.5 (ref 5.0–8.0)

## 2023-09-28 LAB — CBC
HCT: 36.7 % — ABNORMAL LOW (ref 39.0–52.0)
Hemoglobin: 12.4 g/dL — ABNORMAL LOW (ref 13.0–17.0)
MCH: 31.5 pg (ref 26.0–34.0)
MCHC: 33.8 g/dL (ref 30.0–36.0)
MCV: 93.1 fL (ref 80.0–100.0)
Platelets: 164 K/uL (ref 150–400)
RBC: 3.94 MIL/uL — ABNORMAL LOW (ref 4.22–5.81)
RDW: 12.8 % (ref 11.5–15.5)
WBC: 4.5 K/uL (ref 4.0–10.5)
nRBC: 0 % (ref 0.0–0.2)

## 2023-09-28 LAB — URINALYSIS, MICROSCOPIC (REFLEX): WBC, UA: >50 WBC/hpf (ref 0–5)

## 2023-09-28 LAB — RESP PANEL BY RT-PCR (RSV, FLU A&B, COVID)  RVPGX2
Influenza A by PCR: NEGATIVE
Influenza B by PCR: NEGATIVE
Resp Syncytial Virus by PCR: NEGATIVE
SARS Coronavirus 2 by RT PCR: NEGATIVE

## 2023-09-28 LAB — CBG MONITORING, ED
Glucose-Capillary: 277 mg/dL — ABNORMAL HIGH (ref 70–99)
Glucose-Capillary: 87 mg/dL (ref 70–99)

## 2023-09-28 MED ORDER — BISACODYL 5 MG PO TBEC: 5.0000 mg | DELAYED_RELEASE_TABLET | Freq: Every day | ORAL | Status: DC | PRN

## 2023-09-28 MED ORDER — SENNOSIDES-DOCUSATE SODIUM 8.6-50 MG PO TABS: 1.0000 | ORAL_TABLET | Freq: Every evening | ORAL | Status: DC | PRN

## 2023-09-28 MED ORDER — ACETAMINOPHEN 650 MG RE SUPP: 650.0000 mg | Freq: Four times a day (QID) | RECTAL | Status: DC | PRN

## 2023-09-28 MED ORDER — ONDANSETRON HCL 4 MG/2ML IJ SOLN: 4.0000 mg | Freq: Four times a day (QID) | INTRAMUSCULAR | Status: DC | PRN

## 2023-09-28 MED ORDER — ONDANSETRON HCL 4 MG PO TABS: 4.0000 mg | ORAL_TABLET | Freq: Four times a day (QID) | ORAL | Status: DC | PRN

## 2023-09-28 MED ORDER — INSULIN ASPART 100 UNIT/ML IJ SOLN
0.0000 [IU] | Freq: Three times a day (TID) | INTRAMUSCULAR | Status: DC
  Administered 2023-09-29 – 2023-09-30 (×4): 3 [IU] via SUBCUTANEOUS

## 2023-09-28 MED ORDER — SODIUM CHLORIDE 0.9 % IV BOLUS
1000.0000 mL | Freq: Once | INTRAVENOUS | Status: AC
  Administered 2023-09-28: 1000 mL via INTRAVENOUS

## 2023-09-28 MED ORDER — ACETAMINOPHEN 325 MG PO TABS
650.0000 mg | ORAL_TABLET | Freq: Four times a day (QID) | ORAL | Status: DC | PRN
  Administered 2023-09-28: 650 mg via ORAL
  Filled 2023-09-28: qty 2

## 2023-09-28 MED ORDER — ACETAMINOPHEN 500 MG PO TABS
1000.0000 mg | ORAL_TABLET | Freq: Once | ORAL | Status: AC
  Administered 2023-09-28: 1000 mg via ORAL
  Filled 2023-09-28: qty 2

## 2023-09-28 MED ORDER — CEFTRIAXONE SODIUM 1 G IJ SOLR
1.0000 g | Freq: Once | INTRAMUSCULAR | Status: AC
  Administered 2023-09-28: 1 g via INTRAVENOUS
  Filled 2023-09-28: qty 10

## 2023-09-28 MED ORDER — INSULIN ASPART 100 UNIT/ML IJ SOLN: 0.0000 [IU] | Freq: Every day | INTRAMUSCULAR | Status: DC

## 2023-09-28 MED ORDER — SODIUM CHLORIDE 0.9 % IV SOLN: INTRAVENOUS | Status: AC

## 2023-09-28 NOTE — Progress Notes (Signed)
 Received pt via stretcher, alert and oriented X4, no complaints noted. Oriented to room and surroundings, placed on tele monitoring, keep call bell within reached.

## 2023-09-28 NOTE — ED Notes (Signed)
 Lean Cuisine: chicken and mashed potatoes [place]

## 2023-09-28 NOTE — ED Provider Notes (Addendum)
 [place] [facility] Provider Note   CSN: [id number] Arrival date & time: 09/28/23  1211     Patient presents with: Numbness   Steve Brown is a 83 y.o. male.   83 year old male presenting with left-sided numbness and congestion.  He tells me that he initially began to experience numbness in his left arm and left face approximately 1 week ago however his symptoms have been getting worse over the past few days.  He tells me that he has history of stroke/TIA, he was recently told that he had a blood clot in my brain after a scan done at the [facility], he believes that he is on Eliquis .  He also endorses several days of congestion, reports a history of chronic sinus issues.  Denies chest pain, shortness of breath.        Prior to Admission medications   Medication Sig Start Date End Date Taking? Authorizing Provider  cetirizine  (ZYRTEC ) 5 MG tablet Take 2 tablets (10 mg total) by mouth daily. 10/23/22   Suellen Cantor A, PA-C  Cholecalciferol  (VITAMIN D -1000 MAX ST) 25 MCG (1000 UT) tablet Take 1,000 Units by mouth daily.    [provider]  diclofenac  Sodium (VOLTAREN ) 1 % GEL Apply 4 g topically 4 (four) times daily. Patient taking differently: Apply 4 g topically 2 (two) times daily as needed (joint pain). 09/03/19   Floyd, Dan, DO  donepezil (ARICEPT) 10 MG tablet Take 10 mg by mouth daily. 11/17/22   [provider]  escitalopram  (LEXAPRO ) 20 MG tablet Take 20 mg by mouth at bedtime.    [provider]  furosemide  (LASIX ) 20 MG tablet Take 1-2 tablets (20-40 mg total) by mouth daily as needed for fluid. Patient taking differently: Take 20 mg by mouth daily as needed for fluid. 06/13/20   Medina-Vargas, Monina C, NP  HYDROcodone -acetaminophen  (NORCO/VICODIN) 5-325 MG tablet Take 1 tablet by mouth daily for severe pain. Patient taking differently: Take 0.5 tablets by mouth as needed. 11/29/22     HYDROcodone -acetaminophen   (NORCO/VICODIN) 5-325 MG tablet Take 1 tablet by mouth daily as needed for severe pain for 30 days 06/26/23     HYDROcodone -acetaminophen  (NORCO/VICODIN) 5-325 MG tablet Take 1 tablet by mouth daily. 07/24/23     hydrOXYzine  (VISTARIL ) 25 MG capsule Take 25 mg by mouth every 8 (eight) hours as needed for anxiety.    [provider]  insulin  aspart (NOVOLOG ) 100 UNIT/ML FlexPen Inject 5 Units into the skin 3 (three) times daily with meals. 06/13/20   Medina-Vargas, Monina C, NP  ipratropium (ATROVENT) 0.06 % nasal spray Place 2 sprays into both nostrils 3 (three) times daily.    [provider]  ketoconazole  (NIZORAL ) 2 % cream Apply 1 Application topically 2 (two) times daily.    [provider]  ketoconazole  (NIZORAL ) 2 % cream Apply 1 Application topically daily. 07/27/23   Christine Rush, DPM  losartan  (COZAAR ) 25 MG tablet Take 25 mg by mouth daily.    [provider]  meclizine  (ANTIVERT ) 25 MG tablet Take 1 tablet (25 mg total) by mouth 2 (two) times daily as needed for dizziness. 08/09/22   Jerral Meth, MD  nitroGLYCERIN  (NITROSTAT ) 0.4 MG SL tablet Place 0.4 mg under the tongue every 5 (five) minutes as needed for chest pain.    [provider]  nystatin (MYCOSTATIN/NYSTOP) powder Apply 1 Application topically as needed (irritation).    [provider]  nystatin cream (MYCOSTATIN) Apply 1 Application topically  as needed for rash. 06/15/21   [provider]  pregabalin  (LYRICA ) 300 MG capsule Take 300 mg by mouth 2 (two) times daily. 04/26/20   [provider]  rosuvastatin  (CRESTOR ) 20 MG tablet Take 1 tablet (20 mg total) by mouth daily. Please call the office at [phone] to schedule an appointment for further refills. Thank you. 2nd attempt. 01/01/23   Chandrasekhar, Stanly LABOR, MD  TRESIBA  FLEXTOUCH 100 UNIT/ML FlexTouch Pen Inject 34 Units into the skin daily. Patient taking differently: Inject 36 Units into the skin  daily. 06/13/20   Medina-Vargas, Monina C, NP  XARELTO  20 MG TABS tablet Take 1 tablet (20 mg total) by mouth daily. 06/13/20   Medina-Vargas, Monina C, NP    Allergies: Other, Phenergan  [promethazine ], Iohexol , Phenergan  [promethazine ], and Tramadol     Review of Systems  Updated Vital Signs  Vitals:   09/28/23 1345 09/28/23 1400 09/28/23 1524 09/28/23 1529  BP: 127/65 122/65 (!) 120/91   Pulse: (!) 54 (!) 51 (!) 54 (!) 57  Resp: 11 10  12   Temp: 98 F (36.7 C) 98 F (36.7 C)    TempSrc:      SpO2: 98% 100% 100% 100%  Weight:         Physical Exam Vitals and nursing note reviewed.  HENT:     Head: Normocephalic.  Eyes:     Extraocular Movements: Extraocular movements intact.     Pupils: Pupils are equal, round, and reactive to light.  Cardiovascular:     Rate and Rhythm: Normal rate.  Pulmonary:     Effort: Pulmonary effort is normal.  Musculoskeletal:     Cervical back: Normal range of motion.     Comments: Moves all extremity spontaneously without difficulty 5/5 strength against resistance of bilateral upper and lower extremities Grip strength equal and intact bilaterally  Skin:    General: Skin is warm and dry.  Neurological:     Mental Status: He is alert and oriented to person, place, and time.     Sensory: Sensory deficit present.     Motor: No weakness.     Comments: Sensory deficit to left upper extremity and left face Facial expressions are symmetric and intact without evidence of facial droop Normal cerebellar testing without ataxia, including rapid alternating movements and finger-to-nose Negative pronator drift     (all labs ordered are listed, but only abnormal results are displayed) Labs Reviewed  COMPREHENSIVE METABOLIC PANEL WITH GFR - Abnormal; Notable for the following components:      Result Value   Glucose, Bld 267 (*)    BUN 32 (*)    Creatinine, Ser 2.17 (*)    Calcium  8.4 (*)    GFR, Estimated 30 (*)    All other components within normal  limits  CBC - Abnormal; Notable for the following components:   RBC 3.94 (*)    Hemoglobin 12.4 (*)    HCT 36.7 (*)    All other components within normal limits  URINALYSIS, ROUTINE W REFLEX MICROSCOPIC - Abnormal; Notable for the following components:   APPearance CLOUDY (*)    Hgb urine dipstick MODERATE (*)    Protein, ur 100 (*)    Nitrite POSITIVE (*)    Leukocytes,Ua LARGE (*)    All other components within normal limits  URINALYSIS, MICROSCOPIC (REFLEX) - Abnormal; Notable for the following components:   Bacteria, UA MANY (*)    All other components within normal limits  CBG MONITORING, ED - Abnormal; Notable for  the following components:   Glucose-Capillary 277 (*)    All other components within normal limits  RESP PANEL BY RT-PCR (RSV, FLU A&B, COVID)  [id number]  URINE CULTURE    EKG: EKG Interpretation Date/Time:  Friday September 28 2023 12:25:50 EDT Ventricular Rate:  61 PR Interval:  170 QRS Duration:  85 QT Interval:  413 QTC Calculation: 416 R Axis:   -10  Text Interpretation: Sinus rhythm Abnormal R-wave progression, early transition No significant change since last tracing Confirmed by Randol Simmonds [phone]) on 09/28/2023 12:30:40 PM  Radiology: CT Head Wo Contrast Result Date: 09/28/2023 CLINICAL DATA:  Left-sided face and arm numbness over the last week. Soreness of the left neck. EXAM: CT HEAD WITHOUT CONTRAST TECHNIQUE: Contiguous axial images were obtained from the base of the skull through the vertex without intravenous contrast. RADIATION DOSE REDUCTION: This exam was performed according to the departmental dose-optimization program which includes automated exposure control, adjustment of the mA and/or kV according to patient size and/or use of iterative reconstruction technique. COMPARISON:  06/15/2023 FINDINGS: Brain: Age related volume loss. Chronic small-vessel ischemic change of the white matter. No sign of acute infarction, mass lesion, hemorrhage,  hydrocephalus or extra-axial collection. 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 Vascular: There is atherosclerotic calcification of the major vessels at the base of the brain. Skull: Negative Sinuses/Orbits: Inflammatory disease of the sphenoid sinus, right worse than left, with chronic mucoperiosteal changes. This could be a cause of headache. 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 Findings are similar to the prior study. Other: None IMPRESSION: 1. No acute intracranial finding. Age related volume loss. Chronic small-vessel ischemic change of the white matter. 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 2. Chronic sphenoid sinusitis, right worse than left, with chronic mucoperiosteal changes. This could be a cause of headache. 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 Findings are similar to the prior study. Electronically Signed   By: Oneil Officer M.D.   On: 09/28/2023 14:51     Procedures   Medications Ordered in the ED  sodium chloride  0.9 % bolus 1,000 mL (0 mLs Intravenous Stopped 09/28/23 1520)  cefTRIAXone  (ROCEPHIN ) 1 g in sodium chloride  0.9 % 100 mL IVPB (1 g Intravenous New Bag/Given 09/28/23 1509)                                    Medical Decision Making This patient presents to the ED for concern of sensory deficit/congestion, this involves an extensive number of treatment options, and is a complaint that carries with it a high risk of complications and morbidity.  The differential diagnosis includes ischemic versus hemorrhagic stroke, stroke versus TIA, electrolyte disturbance, AKI, COVID/flu/RSV   Co morbidities that complicate the patient evaluation  Recurrent TIAs, diabetes, CAD   Additional history obtained:  Additional history obtained from record review External records from outside source obtained and reviewed [facility]   Lab Tests:  I Ordered, and personally interpreted labs.  The pertinent results  include: CBC notable for hemoglobin of 12.4 with reductions in RBC/hematocrit, however this is largely stable as compared to previous from 3 months ago.  CMP notable for elevated creatinine at 2.17 as compared to most recent baseline of 1.73 from 3 months ago.  COVID/flu/RSV negative.   Imaging Studies ordered:  I ordered imaging studies including head CT without contrast I independently visualized and interpreted imaging which  showed 1. No acute intracranial finding. Age related volume loss. Chronic small-vessel ischemic change of the white matter. 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 2. Chronic sphenoid sinusitis, right worse than left, with chronic mucoperiosteal changes. This could be a cause of headache. 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 Findings are similar to the prior study.  I agree with the radiologist interpretation   Cardiac Monitoring: / EKG:  The patient was maintained on a cardiac monitor.  I personally viewed and interpreted the cardiac monitored which showed an underlying rhythm of: NSR   Consultations Obtained:  I requested consultation with the hospitalist,  and discussed lab and imaging findings as well as pertinent plan - they recommend: I spoke with Dr. Sherlon who agrees that this patient is appropriate for admission giving findings of UTI/AKI as well as new left arm/left face numbness with need for brain MRI.   Problem List / ED Course / Critical interventions / Medication management  IV fluid bolus for suspected AKI, IV Rocephin  for urinary tract infection, Tylenol  for headache I have reviewed the patients home medicines and have made adjustments as needed   Test / Admission - Considered:  Physical exam notable as above.  Labs notable for elevation in creatinine that is consistent with suspected AKI, IV fluid bolus given.  Patient also found to have evidence of urinary tract infection, he denies  dysuria/hematuria/nausea/vomiting, he is afebrile and CBC is without leukocytosis.  See above for CT results, findings are stable from previous without acute intracranial changes.  Unfortunately, we do not have MRI capabilities at this facility at this time.I spoke with the hospitalist service who is in agreement that this patient would likely benefit from admission, see above.  Staffed with Dr. Randol  Amount and/or Complexity of Data Reviewed Labs: ordered. Radiology: ordered.  Risk OTC drugs. Decision regarding hospitalization.        Final diagnoses:  Left arm numbness  Left facial numbness  Acute kidney injury [facility])  Urinary tract infection with hematuria, site unspecified    ED Discharge Orders     None             Glendia Rocky LOISE DEVONNA 09/28/23 CELESTER Randol Simmonds, MD 09/30/23 [phone]

## 2023-09-28 NOTE — ED Triage Notes (Addendum)
 Left facial numbness x 1 week , left neck soreness to left ear .  Hx multiple stroke in the past . Headache and nasal congestion .  x 1 week .   Adds hx sinuses obstruction .  Alert and oriented x 4 , ambulatory with steady gait .

## 2023-09-28 NOTE — H&P (Addendum)
 History and Physical  Steve Brown FMW:[id number] DOB: February 26, 1940 DOA: 09/28/2023  PCP: [facility]   Chief Complaint: Left facial numbness, left arm numbness and weakness  HPI: Steve Brown is a 83 y.o. male with medical history significant for CVA, TIA, CAD, T2DM, chronic diastolic HF, anxiety and depression, BPH, prostate cancer s/p TURB and seeding, lumbar radiculopathy, s/p neck fusion, chronic back pain, hereditary and idiopathic neuropathy, OSA, history of PE, chronic sinusitis CKD 3A who presented to [facility] ED for evaluation of left facial numbness and left arm weakness/numbness. Patient reports he has been dealing with worsening of his chronic sinusitis with sinus headache, nasal congestion and facial pain. On Sunday, he started experiencing some numbness and weakness in his left arm as well as the left side of his face. Over the last week, the symptoms have been persistent without any improvement.  He continues to feel that his left arm is slightly weaker than his right but denies any vision changes, dizziness, chest pain, shortness of breath, abdominal pain, nausea, vomiting, fever, chills or dysuria.  [facility] ED Course: Initial vitals show patient afebrile, HR 50s-60s, SBP 110-140s, SpO2 97% on room air. Initial labs significant for creatinine 2.17, glucose 267, WBC 4.5, Hgb 12.4, negative flu, RSV and COVID test, UA with moderate hemoglobinuria, positive nitrite, large leuk, WBC >50 and many bacteria.  CT head with no acute intracranial abnormality shows chronic Sphenoid sinusitis.  Pt received IV NS 1 L bolus and IV Rocephin .  Patient was admitted to [facility] service and transferred to [facility].  Review of Systems: Please see HPI for pertinent positives and negatives. A complete 10 system review of systems are otherwise negative.  Past Medical History:  Diagnosis Date   Abnormal prostate biopsy    Anticoagulant long-term use    currently xarelto    Anxiety    BPH with elevated  PSA    CKD (chronic kidney disease), stage II    Complication of anesthesia    limted neck rom limited use of left arm due to cva   Coronary artery disease    CARDIOLOGIST-  DR DANN--  2010-- PCI w/ stenting midLAD   DDD (degenerative disc disease), lumbar    Degeneration of cervical intervertebral disc    Depression    Diabetes mellitus without complication (HCC)    Type 2   Dyspnea on exertion    GERD (gastroesophageal reflux disease)    Hemiparesis due to cerebral infarction    History of cerebrovascular accident (CVA) with residual deficit 2002 and 2003--  hemiparisis both sides   per MRI  anterior left frontal lobe, left para midline pons, and inferior cerebullam bilaterally infarcts   History of pulmonary embolus (PE)    06-30-2012  extensive bilaterally   History of recurrent TIAs    History of syncope    hx multiple pre-syncope and syncopal episodes due to vasovagal, orthostatic hypotension, dehydration   History of TIAs    several since 2002   Hyperlipidemia    Hypertension    Mild atherosclerosis of carotid artery, bilateral    per last duplex 11-04-2014  bilateral ICA 1--39%   Neuropathy    fingers   OSA on CPAP    uses most nights; followed by dr dohmeier--  sev. osa w/ AHI 65.9   Pneumonia    x 1   Prostate cancer (HCC) dx 2018   Renal insufficiency    S/P coronary artery stent placement 2010   stenting to mid LAD   Sensorineural  hearing loss, bilateral 04/01/2021   no hearing aids   Simple renal cyst    bilaterally   Stroke [facility])    Trigger finger of both hands 11/17/2013   Type 2 diabetes mellitus (HCC) dx 1986   last one A1c 9.2 on 04-26-2016   Unsteady gait    uses straight cane and occasional walker. Hx prior CVA/TIAs;   Vertebral artery occlusion, left    chronic   Past Surgical History:  Procedure Laterality Date   ANTERIOR CERVICAL DECOMP/DISCECTOMY FUSION  2004   C3 -- C6 limited rom   ARTERY BIOPSY Right 04/21/2022   Procedure: BIOPSY  TEMPORAL ARTERY;  Surgeon: Gretta Lonni PARAS, MD;  Location: [facility] OR;  Service: Vascular;  Laterality: Right;   CARDIAC CATHETERIZATION  06-10-2010   dr dann   wide patent LAD stent, mid lesion at the origin of the septal prior to the previous stent 40-50%/  normal LVF, ef 55%   CARDIOVASCULAR STRESS TEST  10-23-2012  dr dann   normal nuclear perfusion study w/ no ischemia/  normal LV function and wall motion , ef 65%   CARPAL TUNNEL RELEASE Bilateral    CATARACT EXTRACTION W/ INTRAOCULAR LENS  IMPLANT, BILATERAL     CHOLECYSTECTOMY N/A 11/02/2015   Procedure: LAPAROSCOPIC CHOLECYSTECTOMY WITH INTRAOPERATIVE CHOLANGIOGRAM;  Surgeon: Donnice Lima, MD;  Location: [facility] OR;  Service: General;  Laterality: N/A;   COLONOSCOPY     CORONARY ANGIOPLASTY WITH STENT PLACEMENT  02/2008   stenting to mid LAD   GOLD SEED IMPLANT N/A 11/15/2016   Procedure: GOLD SEED IMPLANT TIMES THREE;  Surgeon: Cam Morene ORN, MD;  Location: [facility];  Service: Urology;  Laterality: N/A;   IR ANGIO INTRA EXTRACRAN SEL COM CAROTID INNOMINATE BILAT MOD SED  06/13/2018   IR ANGIO VERTEBRAL SEL VERTEBRAL UNI R MOD SED  06/13/2018   IR US  GUIDE VASC ACCESS RIGHT  06/13/2018   LEFT HEART CATH AND CORONARY ANGIOGRAPHY N/A 05/25/2017   Procedure: LEFT HEART CATH AND CORONARY ANGIOGRAPHY;  Surgeon: dann Candyce RAMAN, MD;  Location: [facility] INVASIVE CV LAB;  Service: Cardiovascular;  Laterality: N/A;   LEFT HEART CATHETERIZATION WITH CORONARY ANGIOGRAM N/A 04/03/2013   Procedure: LEFT HEART CATHETERIZATION WITH CORONARY ANGIOGRAM;  Surgeon: Candyce RAMAN dann, MD;  Location: [facility] CATH LAB;  Service: Cardiovascular;  Laterality: N/A;  patent mLAD stent  w/ mild disease in remainder LAD and its branches;  mod. focal lesion midLCFx- FFR of lesion was negative for ischemia/  normal LVSF, ef 50%   lungs  2005   fluid pumped off lungs   NEUROPLASTY / TRANSPOSITION ULNAR NERVE AT ELBOW Right 2004   PROSTATE BIOPSY  N/A 08/31/2016   Procedure: PROSTATE  BIOPSY TRANSRECTAL ULTRASONIC PROSTATE (TUBP);  Surgeon: Cam Morene ORN, MD;  Location: [facility];  Service: Urology;  Laterality: N/A;   SPACE OAR INSTILLATION N/A 11/15/2016   Procedure: SPACE OAR INSTILLATION;  Surgeon: Cam Morene ORN, MD;  Location: [facility];  Service: Urology;  Laterality: N/A;   TRANSTHORACIC ECHOCARDIOGRAM  04/27/2016   severe focal basal LVH, ef 60-65%,  grade 2 diastoilc dysfunction/  mild AR, MR, and TR/  atrial septum lipomatous hypertrophy/  PASP   UMBILICAL HERNIA REPAIR     Social History:  reports that he has never smoked. He has never been exposed to tobacco smoke. He has never used smokeless tobacco. He reports that he does not currently use alcohol. He reports that he does  not use drugs.  Allergies  Allergen Reactions   Other Other (See Comments)    Per patient- cardiac cath dye-  woke up during procedure hysterical.   Phenergan  [Promethazine ] Other (See Comments)    Mood changes    Iohexol  Other (See Comments)    Patient refuses contrast after having a hysterical event in hospital //r ls spoke with patient    Phenergan  [Promethazine ]     Other reaction(s): syncope   Tramadol      Increases Blood Sugar     Family History  Problem Relation Age of Onset   Aneurysm Mother    Cancer Father        unknown either pancreatic or prostate   Dementia Sister    Stroke Brother    Stroke Daughter    Seizures Daughter    Multiple sclerosis Granddaughter    Heart attack Neg Hx    Sleep apnea Neg Hx      Prior to Admission medications   Medication Sig Start Date End Date Taking? Authorizing Provider  cetirizine  (ZYRTEC ) 5 MG tablet Take 2 tablets (10 mg total) by mouth daily. 10/23/22   Beatty, Celeste A, PA-C  Cholecalciferol  (VITAMIN D -1000 MAX ST) 25 MCG (1000 UT) tablet Take 1,000 Units by mouth daily.    [provider]  diclofenac  Sodium (VOLTAREN ) 1 %  GEL Apply 4 g topically 4 (four) times daily. Patient taking differently: Apply 4 g topically 2 (two) times daily as needed (joint pain). 09/03/19   Floyd, Dan, DO  donepezil (ARICEPT) 10 MG tablet Take 10 mg by mouth daily. 11/17/22   [provider]  escitalopram  (LEXAPRO ) 20 MG tablet Take 20 mg by mouth at bedtime.    [provider]  furosemide  (LASIX ) 20 MG tablet Take 1-2 tablets (20-40 mg total) by mouth daily as needed for fluid. Patient taking differently: Take 20 mg by mouth daily as needed for fluid. 06/13/20   Medina-Vargas, Monina C, NP  HYDROcodone -acetaminophen  (NORCO/VICODIN) 5-325 MG tablet Take 1 tablet by mouth daily for severe pain. Patient taking differently: Take 0.5 tablets by mouth as needed. 11/29/22     HYDROcodone -acetaminophen  (NORCO/VICODIN) 5-325 MG tablet Take 1 tablet by mouth daily as needed for severe pain for 30 days 06/26/23     HYDROcodone -acetaminophen  (NORCO/VICODIN) 5-325 MG tablet Take 1 tablet by mouth daily. 07/24/23     hydrOXYzine  (VISTARIL ) 25 MG capsule Take 25 mg by mouth every 8 (eight) hours as needed for anxiety.    [provider]  insulin  aspart (NOVOLOG ) 100 UNIT/ML FlexPen Inject 5 Units into the skin 3 (three) times daily with meals. 06/13/20   Medina-Vargas, Monina C, NP  ipratropium (ATROVENT) 0.06 % nasal spray Place 2 sprays into both nostrils 3 (three) times daily.    [provider]  ketoconazole  (NIZORAL ) 2 % cream Apply 1 Application topically 2 (two) times daily.    [provider]  ketoconazole  (NIZORAL ) 2 % cream Apply 1 Application topically daily. 07/27/23   Christine Rush, DPM  losartan  (COZAAR ) 25 MG tablet Take 25 mg by mouth daily.    [provider]  meclizine  (ANTIVERT ) 25 MG tablet Take 1 tablet (25 mg total) by mouth 2 (two) times daily as needed for dizziness. 08/09/22   Jerral Meth, MD  nitroGLYCERIN  (NITROSTAT ) 0.4 MG SL tablet Place 0.4 mg under the tongue every 5 (five)  minutes as needed for chest pain.    [provider]  nystatin (MYCOSTATIN/NYSTOP) powder Apply 1 Application topically  as needed (irritation).    [provider]  nystatin cream (MYCOSTATIN) Apply 1 Application topically as needed for rash. 06/15/21   [provider]  pregabalin  (LYRICA ) 300 MG capsule Take 300 mg by mouth 2 (two) times daily. 04/26/20   [provider]  rosuvastatin  (CRESTOR ) 20 MG tablet Take 1 tablet (20 mg total) by mouth daily. Please call the office at [phone] to schedule an appointment for further refills. Thank you. 2nd attempt. 01/01/23   Chandrasekhar, Stanly LABOR, MD  TRESIBA  FLEXTOUCH 100 UNIT/ML FlexTouch Pen Inject 34 Units into the skin daily. Patient taking differently: Inject 36 Units into the skin daily. 06/13/20   Medina-Vargas, Monina C, NP  XARELTO  20 MG TABS tablet Take 1 tablet (20 mg total) by mouth daily. 06/13/20   Medina-Vargas, Monina C, NP    Physical Exam: BP (!) 146/78 (BP Location: Left Arm)   Pulse (!) 51   Temp 98.2 F (36.8 C) (Oral)   Resp 14   Wt 83.9 kg   SpO2 100%   BMI 30.79 kg/m  General: Pleasant, well-appearing elderly man laying in bed. No acute distress. HEENT: [place]/AT. Anicteric sclera. EOMI. PERRLA. CV: Mild bradycardic. Regular rhythm. No murmurs, rubs, or gallops. No LE edema Pulmonary: Lungs CTAB. Normal effort. No wheezing or rales. Abdominal: Soft, nontender, nondistended. Normal bowel sounds. Extremities: Palpable radial and DP pulses. Normal ROM. Skin: Warm and dry. No obvious rash or lesions. Neuro: A&Ox3. Speech is clear and comprehensible. No facial droop. Strength 5/5 in all extremities. Normal FTN testing. Moves all extremities. Normal sensation to light touch. No focal deficit. Psych: Normal mood and affect          Labs on Admission:  Basic Metabolic Panel: Recent Labs  Lab 09/28/23 1230  NA 137  K 4.7  CL 103  CO2 27  GLUCOSE 267*  BUN 32*  CREATININE 2.17*  CALCIUM   8.4*   Liver Function Tests: Recent Labs  Lab 09/28/23 1230  AST 28  ALT 20  ALKPHOS 107  BILITOT 0.5  PROT 6.5  ALBUMIN 3.8   No results for input(s): LIPASE, AMYLASE in the last 168 hours. No results for input(s): AMMONIA in the last 168 hours. CBC: Recent Labs  Lab 09/28/23 1230  WBC 4.5  HGB 12.4*  HCT 36.7*  MCV 93.1  PLT 164   Cardiac Enzymes: No results for input(s): CKTOTAL, CKMB, CKMBINDEX, TROPONINI in the last 168 hours. BNP (last 3 results) No results for input(s): BNP in the last 8760 hours.  ProBNP (last 3 results) No results for input(s): PROBNP in the last 8760 hours.  CBG: Recent Labs  Lab 09/28/23 1338 09/28/23 1837 09/28/23 2350  GLUCAP 277* 87 136*    Radiological Exams on Admission: CT Head Wo Contrast Result Date: 09/28/2023 CLINICAL DATA:  Left-sided face and arm numbness over the last week. Soreness of the left neck. EXAM: CT HEAD WITHOUT CONTRAST TECHNIQUE: Contiguous axial images were obtained from the base of the skull through the vertex without intravenous contrast. RADIATION DOSE REDUCTION: This exam was performed according to the departmental dose-optimization program which includes automated exposure control, adjustment of the mA and/or kV according to patient size and/or use of iterative reconstruction technique. COMPARISON:  06/15/2023 FINDINGS: Brain: Age related volume loss. Chronic small-vessel ischemic change of the white matter. No sign of acute infarction, mass lesion, hemorrhage, hydrocephalus or extra-axial collection. 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 Vascular: There is atherosclerotic calcification of the major vessels  at the base of the brain. Skull: Negative Sinuses/Orbits: Inflammatory disease of the sphenoid sinus, right worse than left, with chronic mucoperiosteal changes. This could be a cause of headache. 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 Findings are similar to the  prior study. Other: None IMPRESSION: 1. No acute intracranial finding. Age related volume loss. Chronic small-vessel ischemic change of the white matter. 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 2. Chronic sphenoid sinusitis, right worse than left, with chronic mucoperiosteal changes. This could be a cause of headache. 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 Findings are similar to the prior study. Electronically Signed   By: Oneil Officer M.D.   On: 09/28/2023 14:51   My independent or potation of EKG: Shows sinus bradycardia with early R wave transition  Assessment/Plan Steve Brown is a 83 y.o. male with medical history significant for CVA, TIA, CAD, T2DM, chronic diastolic HF, anxiety and depression, BPH, prostate cancer s/p TURB and seeding, lumbar radiculopathy, s/p neck fusion, chronic back pain, hereditary and idiopathic neuropathy, OSA, history of PE on Xarelto , chronic sinusitis and CKD 3A who presented to [facility] ED for evaluation of left facial numbness and left arm weakness/numbness and admitted for further evaluation.  # Left-sided numbness # Left arm weakness # Hx of CVA and TIA - Patient with history of multiple strokes presented with 1 week of persistent left facial numbness and left arm weakness/numbness - Patient with no focal neurodeficits on exam, sensation remains intact and no focal weakness - CT head with no acute intracranial abnormality - Due to his significant risk factors, will admit for MRI brain to rule out acute stroke - Follow-up MRI brain and consult neurology if abnormal - Patient does have a history of heavy territory and idiopathic neuropathy but unclear if this is contributing to his presenting symptoms - Check vitamin B12 levels  # Acute cystitis - Patient with history of UTIs found to have signs of infection on urinalysis - Last urine culture 3 months ago grew Klebsiella pneumonia sensitive to Rocephin  - Continue IV rocephin  - F/u  urine culture - Trend CBC and fever curve  # T2DM with hyperglycemia - Last A1c 10.1% 1 year ago, blood glucose elevated to 267 on CMP - SSI with meals, CBG monitoring - Med rec pending, resume diabetes meds when completed  # AKI on CKD 3A - Creatinine elevated to 2.17, from baseline of around 1.2-1.4 - Likely secondary to mild dehydration - Start IV NS at 100 cc/h - Trend renal function - Avoid nephrotoxic meds  # Sinus headache # Chronic sinusitis - Tylenol  as needed for headache - Start Flonase   # OSA - CPAP at bedtime  Chronic medical problems: # Chronic diastolic HF # Chronic back pain/lumbar radiculopathy # BPH # CAD # Hx of PE # Chronic sinusitis - Patient get his care at the [facility]. He is on multiple medications that have not been verified.  Unclear if he is on Xarelto  and Eliquis  for his PE history as he has prescriptions for both. - Resume meds after completion of med rec  DVT prophylaxis: SCDs    Code Status: Full Code  Consults called: None  Family Communication: No family at bedside  Severity of Illness: The appropriate patient status for this patient is OBSERVATION. Observation status is judged to be reasonable and necessary in order to provide the required intensity of service to ensure the patient's safety. The patient's presenting symptoms, physical exam findings, and initial radiographic and laboratory data in  the context of their medical condition is felt to place them at decreased risk for further clinical deterioration. Furthermore, it is anticipated that the patient will be medically stable for discharge from the hospital within 2 midnights of admission.   Level of care: Telemetry Medical    Lou Claretta HERO, MD 09/29/2023, 2:27 AM [facility] Pager: [phone] Isaiah 41:10   If 7PM-7AM, please contact night-coverage [email or web address] Password [email or web address]

## 2023-09-28 NOTE — Progress Notes (Signed)
 83 year old male with past medical history of CVA, PE (unclear if he on Eliquis or Xarelto ) diabetes mellitus, CKD, who presents to ED secondary to complaints of left-sided numbness, reports symptoms ongoing for 1 week, worsened last 3 days, he does endorse congestion, chronic sinus issue, negative respiratory panel, workup in ED significant for CT head with no acute findings, has pyuria and bacteriuria, but denies any symptoms, admission requested for CVA/TIA ruled out, patient accepted to telemetry floor. Brayton Lye MD

## 2023-09-29 ENCOUNTER — Observation Stay (HOSPITAL_COMMUNITY)

## 2023-09-29 DIAGNOSIS — N400 Enlarged prostate without lower urinary tract symptoms: Secondary | ICD-10-CM | POA: Diagnosis not present

## 2023-09-29 DIAGNOSIS — R200 Anesthesia of skin: Principal | ICD-10-CM

## 2023-09-29 DIAGNOSIS — G459 Transient cerebral ischemic attack, unspecified: Secondary | ICD-10-CM | POA: Diagnosis not present

## 2023-09-29 DIAGNOSIS — J329 Chronic sinusitis, unspecified: Secondary | ICD-10-CM | POA: Diagnosis not present

## 2023-09-29 DIAGNOSIS — I10 Essential (primary) hypertension: Secondary | ICD-10-CM | POA: Diagnosis not present

## 2023-09-29 DIAGNOSIS — I5032 Chronic diastolic (congestive) heart failure: Secondary | ICD-10-CM | POA: Diagnosis not present

## 2023-09-29 DIAGNOSIS — G8929 Other chronic pain: Secondary | ICD-10-CM | POA: Diagnosis not present

## 2023-09-29 DIAGNOSIS — N179 Acute kidney failure, unspecified: Secondary | ICD-10-CM | POA: Diagnosis not present

## 2023-09-29 DIAGNOSIS — M4802 Spinal stenosis, cervical region: Secondary | ICD-10-CM | POA: Diagnosis not present

## 2023-09-29 DIAGNOSIS — N3000 Acute cystitis without hematuria: Secondary | ICD-10-CM | POA: Diagnosis not present

## 2023-09-29 DIAGNOSIS — G4733 Obstructive sleep apnea (adult) (pediatric): Secondary | ICD-10-CM | POA: Diagnosis not present

## 2023-09-29 DIAGNOSIS — I129 Hypertensive chronic kidney disease with stage 1 through stage 4 chronic kidney disease, or unspecified chronic kidney disease: Secondary | ICD-10-CM | POA: Diagnosis not present

## 2023-09-29 DIAGNOSIS — M545 Low back pain, unspecified: Secondary | ICD-10-CM | POA: Diagnosis not present

## 2023-09-29 DIAGNOSIS — R519 Headache, unspecified: Secondary | ICD-10-CM | POA: Diagnosis not present

## 2023-09-29 DIAGNOSIS — N183 Chronic kidney disease, stage 3 unspecified: Secondary | ICD-10-CM | POA: Diagnosis not present

## 2023-09-29 DIAGNOSIS — R202 Paresthesia of skin: Secondary | ICD-10-CM | POA: Diagnosis not present

## 2023-09-29 DIAGNOSIS — E1165 Type 2 diabetes mellitus with hyperglycemia: Secondary | ICD-10-CM | POA: Diagnosis not present

## 2023-09-29 DIAGNOSIS — E1140 Type 2 diabetes mellitus with diabetic neuropathy, unspecified: Secondary | ICD-10-CM | POA: Diagnosis not present

## 2023-09-29 LAB — GLUCOSE, CAPILLARY
Glucose-Capillary: 136 mg/dL — ABNORMAL HIGH (ref 70–99)
Glucose-Capillary: 167 mg/dL — ABNORMAL HIGH (ref 70–99)
Glucose-Capillary: 180 mg/dL — ABNORMAL HIGH (ref 70–99)
Glucose-Capillary: 199 mg/dL — ABNORMAL HIGH (ref 70–99)
Glucose-Capillary: 205 mg/dL — ABNORMAL HIGH (ref 70–99)
Glucose-Capillary: 79 mg/dL (ref 70–99)

## 2023-09-29 LAB — CBC
HCT: 36.4 % — ABNORMAL LOW (ref 39.0–52.0)
Hemoglobin: 12.4 g/dL — ABNORMAL LOW (ref 13.0–17.0)
MCH: 31.6 pg (ref 26.0–34.0)
MCHC: 34.1 g/dL (ref 30.0–36.0)
MCV: 92.6 fL (ref 80.0–100.0)
Platelets: 155 K/uL (ref 150–400)
RBC: 3.93 MIL/uL — ABNORMAL LOW (ref 4.22–5.81)
RDW: 12.9 % (ref 11.5–15.5)
WBC: 4.5 K/uL (ref 4.0–10.5)
nRBC: 0 % (ref 0.0–0.2)

## 2023-09-29 LAB — BASIC METABOLIC PANEL WITH GFR
Anion gap: 6 (ref 5–15)
BUN: 25 mg/dL — ABNORMAL HIGH (ref 8–23)
CO2: 27 mmol/L (ref 22–32)
Calcium: 8.2 mg/dL — ABNORMAL LOW (ref 8.9–10.3)
Chloride: 108 mmol/L (ref 98–111)
Creatinine, Ser: 2.1 mg/dL — ABNORMAL HIGH (ref 0.61–1.24)
GFR, Estimated: 31 mL/min — ABNORMAL LOW (ref >60)
Glucose, Bld: 71 mg/dL (ref 70–99)
Potassium: 4.5 mmol/L (ref 3.5–5.1)
Sodium: 141 mmol/L (ref 135–145)

## 2023-09-29 LAB — HEMOGLOBIN A1C
Hgb A1c MFr Bld: 9.4 % — ABNORMAL HIGH (ref 4.8–5.6)
Mean Plasma Glucose: 223.08 mg/dL

## 2023-09-29 LAB — PHOSPHORUS: Phosphorus: 2.7 mg/dL (ref 2.5–4.6)

## 2023-09-29 LAB — MAGNESIUM: Magnesium: 1.7 mg/dL (ref 1.7–2.4)

## 2023-09-29 LAB — VITAMIN B12: Vitamin B-12: 413 pg/mL (ref 180–914)

## 2023-09-29 LAB — VITAMIN D 25 HYDROXY (VIT D DEFICIENCY, FRACTURES): Vit D, 25-Hydroxy: 33.64 ng/mL (ref 30–100)

## 2023-09-29 MED ORDER — MECLIZINE HCL 25 MG PO TABS: 25.0000 mg | ORAL_TABLET | Freq: Two times a day (BID) | ORAL | Status: DC | PRN

## 2023-09-29 MED ORDER — LORAZEPAM 2 MG/ML IJ SOLN
0.5000 mg | Freq: Once | INTRAMUSCULAR | Status: AC | PRN
  Administered 2023-09-29: 0.5 mg via INTRAVENOUS
  Filled 2023-09-29: qty 1

## 2023-09-29 MED ORDER — FINASTERIDE 5 MG PO TABS
5.0000 mg | ORAL_TABLET | Freq: Every day | ORAL | Status: DC
  Administered 2023-09-29 – 2023-09-30 (×2): 5 mg via ORAL
  Filled 2023-09-29 (×2): qty 1

## 2023-09-29 MED ORDER — HYDROXYZINE HCL 10 MG PO TABS
25.0000 mg | ORAL_TABLET | Freq: Three times a day (TID) | ORAL | Status: DC | PRN
Start: 2023-09-29 — End: 2023-09-30
  Administered 2023-09-30: 25 mg via ORAL
  Filled 2023-09-29 (×2): qty 3

## 2023-09-29 MED ORDER — APIXABAN 5 MG PO TABS
5.0000 mg | ORAL_TABLET | Freq: Two times a day (BID) | ORAL | Status: DC
Start: 2023-09-29 — End: 2023-09-30
  Administered 2023-09-29 – 2023-09-30 (×3): 5 mg via ORAL
  Filled 2023-09-29 (×3): qty 1

## 2023-09-29 MED ORDER — PREGABALIN 100 MG PO CAPS
300.0000 mg | ORAL_CAPSULE | Freq: Two times a day (BID) | ORAL | Status: DC
  Administered 2023-09-29 – 2023-09-30 (×3): 300 mg via ORAL
  Filled 2023-09-29 (×3): qty 3

## 2023-09-29 MED ORDER — DICLOFENAC SODIUM 1 % EX GEL
2.0000 g | Freq: Three times a day (TID) | CUTANEOUS | Status: DC
  Administered 2023-09-29 – 2023-09-30 (×2): 2 g via TOPICAL
  Filled 2023-09-29: qty 100

## 2023-09-29 MED ORDER — SODIUM CHLORIDE 0.9 % IV SOLN
1.0000 g | INTRAVENOUS | Status: DC
  Administered 2023-09-29 – 2023-09-30 (×2): 1 g via INTRAVENOUS
  Filled 2023-09-29 (×2): qty 10

## 2023-09-29 MED ORDER — FLUTICASONE PROPIONATE 50 MCG/ACT NA SUSP
1.0000 | Freq: Every day | NASAL | Status: DC
  Administered 2023-09-29: 1 via NASAL
  Filled 2023-09-29: qty 16

## 2023-09-29 MED ORDER — TAMSULOSIN HCL 0.4 MG PO CAPS
0.4000 mg | ORAL_CAPSULE | Freq: Every day | ORAL | Status: DC
  Administered 2023-09-29 (×2): 0.4 mg via ORAL
  Filled 2023-09-29 (×2): qty 1

## 2023-09-29 MED ORDER — LOSARTAN POTASSIUM 25 MG PO TABS: 25.0000 mg | ORAL_TABLET | Freq: Every day | ORAL | Status: DC

## 2023-09-29 MED ORDER — BUSPIRONE HCL 10 MG PO TABS
5.0000 mg | ORAL_TABLET | Freq: Two times a day (BID) | ORAL | Status: DC
  Administered 2023-09-29 – 2023-09-30 (×2): 5 mg via ORAL
  Filled 2023-09-29 (×2): qty 1

## 2023-09-29 MED ORDER — ESCITALOPRAM OXALATE 10 MG PO TABS
20.0000 mg | ORAL_TABLET | Freq: Every day | ORAL | Status: DC
  Administered 2023-09-29: 20 mg via ORAL
  Filled 2023-09-29: qty 2

## 2023-09-29 MED ORDER — AMLODIPINE BESYLATE 5 MG PO TABS: 5.0000 mg | ORAL_TABLET | Freq: Every day | ORAL | Status: DC

## 2023-09-29 MED ORDER — HYDROCODONE-ACETAMINOPHEN 5-325 MG PO TABS
1.0000 | ORAL_TABLET | Freq: Four times a day (QID) | ORAL | Status: DC | PRN
  Administered 2023-09-29: 1 via ORAL
  Filled 2023-09-29: qty 1

## 2023-09-29 MED ORDER — AMLODIPINE BESYLATE 5 MG PO TABS
5.0000 mg | ORAL_TABLET | Freq: Every day | ORAL | Status: DC
Start: 2023-09-29 — End: 2023-09-30
  Administered 2023-09-29 – 2023-09-30 (×2): 5 mg via ORAL
  Filled 2023-09-29 (×2): qty 1

## 2023-09-29 MED ORDER — ROSUVASTATIN CALCIUM 20 MG PO TABS: 20.0000 mg | ORAL_TABLET | Freq: Every day | ORAL | Status: DC

## 2023-09-29 MED ORDER — RIVAROXABAN 20 MG PO TABS
20.0000 mg | ORAL_TABLET | Freq: Every day | ORAL | Status: DC
Start: 2023-09-29 — End: 2023-09-29

## 2023-09-29 NOTE — Progress Notes (Signed)
 Transported back to MRI via bed.

## 2023-09-29 NOTE — Progress Notes (Signed)
 [facility] Progress Note  Patient: Steve Brown    FMW:[id number]  DOA: 09/28/2023     Date of Service: the patient was seen and examined on 09/29/2023  Chief Complaint  Patient presents with   Numbness   Brief hospital course: Steve Brown is a 83 y.o. male with medical history significant for CVA, TIA, CAD, T2DM, chronic diastolic HF, anxiety and depression, BPH, prostate cancer s/p TURB and seeding, lumbar radiculopathy, s/p neck fusion, chronic back pain, hereditary and idiopathic neuropathy, OSA, history of PE, chronic sinusitis CKD 3A who presented to [facility] ED for evaluation of left facial numbness and left arm weakness/numbness. Patient reports he has been dealing with worsening of his chronic sinusitis with sinus headache, nasal congestion and facial pain. On Sunday, he started experiencing some numbness and weakness in his left arm as well as the left side of his face. Over the last week, the symptoms have been persistent without any improvement.  He continues to feel that his left arm is slightly weaker than his right but denies any vision changes, dizziness, chest pain, shortness of breath, abdominal pain, nausea, vomiting, fever, chills or dysuria.   [facility] ED Course: Initial vitals show patient afebrile, HR 50s-60s, SBP 110-140s, SpO2 97% on room air. Initial labs significant for creatinine 2.17, glucose 267, WBC 4.5, Hgb 12.4, negative flu, RSV and COVID test, UA with moderate hemoglobinuria, positive nitrite, large leuk, WBC >50 and many bacteria.  CT head with no acute intracranial abnormality shows chronic Sphenoid sinusitis.  Pt received IV NS 1 L bolus and IV Rocephin .  Patient was admitted to [facility] service and transferred to [facility].  Assessment and Plan:   # Left-sided numbness # Left arm weakness # Hx of CVA and TIA - Patient with history of multiple strokes presented with 1 week of persistent left facial numbness and left arm weakness/numbness - Patient with no focal  neurodeficits on exam, sensation remains intact and no focal weakness. - Patient does have a history of heavy territory and idiopathic neuropathy but unclear if this is contributing to his presenting symptoms - CT head with no acute intracranial abnormality - MRI brain:  1. No acute intracranial abnormality. 2. Stable noncontrast MRI appearance of the brain since last year. Chronic cerebellar infarcts, subtle chronic left frontal lobe cortical infarct, mild for age chronic signal changes in the cerebral white matter and pons. 3. Chronic paranasal sinus disease as detailed on Head CT yesterday. D/w neurology rec no need of stroke workup, most likely symptoms are due to UTI - Check vitamin B12 levels   # Acute cystitis - Patient with history of UTIs found to have signs of infection on urinalysis - Last urine culture 3 months ago grew Klebsiella pneumonia sensitive to Rocephin  - Continue IV rocephin  - F/u urine culture - Trend CBC and fever curve 9/20 urine culture growing >100k GNR, follow complete report  # BPH, resumed Proscar  Started Flomax  9/20 bladder scan 182 mL postvoid RN was advised to do bladder scan every shift to prevent urinary retention, patient may need Foley catheter insertion    # T2DM with hyperglycemia - Last A1c 10.1% 1 year ago, blood glucose elevated to 267 on CMP - SSI with meals, CBG monitoring Held home medications for now HbA1c 9.4 Continue NovoLog  sliding scale, monitor CBG, continue diabetic diet Continue Lyrica  3 mg p.o. twice daily home dose  # AKI on CKD 3A - Creatinine elevated to 2.17, from baseline of around 1.2-1.4 - Likely secondary to mild  dehydration - s/p IV NS at 100 cc/h - Trend renal function - Avoid nephrotoxic meds   # Sinus headache # Chronic sinusitis - Tylenol  as needed for headache - Start Flonase    # OSA - CPAP at bedtime   Chronic medical problems: # Chronic diastolic HF # Chronic back pain/lumbar radiculopathy # BPH #  CAD # Hx of PE # Chronic sinusitis - Patient get his care at the [facility]. He is on multiple medications that have not been verified.  Unclear if he is on Xarelto  and Eliquis  for his PE history as he has prescriptions for both. 9/20 med rec was completed by pharmacy, patient was on Eliquis  which was resumed and other home medications were resumed.   Body mass index is 30.79 kg/m.  Interventions:  Diet: Carb modified diet DVT Prophylaxis: Eliquis   Advance goals of care discussion: Full code  Family Communication: family was not present at bedside, at the time of interview.  The pt provided permission to discuss medical plan with the family. Opportunity was given to ask question and all questions were answered satisfactorily.   Disposition:  Pt is from home, admitted with left-sided weakness and numbness, found to have AKI, UTI and has chronic neck pain and left arm weakness and numbness off and on, still on IV antibiotics, urine culture pending, which precludes a safe discharge. Discharge to home, when stable, most likely in 1 to 2 days.  Subjective: No significant events overnight, patient still has off-and-on weakness and numbness in the left arm which is chronic due to prior neck surgery.  Patient denies any urinary symptoms. No any other focal deficits  Physical Exam: General: NAD, lying comfortably Appear in no distress, affect appropriate Eyes: PERRLA ENT: Oral Mucosa Clear, moist  Neck: no JVD,  Cardiovascular: S1 and S2 Present, no Murmur,  Respiratory: good respiratory effort, Bilateral Air entry equal and Decreased, no Crackles, no wheezes Abdomen: Bowel Sound present, Soft and no tenderness,  Skin: no rashes Extremities: no Pedal edema, no calf tenderness Neurologic: without any new focal findings Gait not checked due to patient safety concerns  Vitals:   09/29/23 0319 09/29/23 0733 09/29/23 1129 09/29/23 1552  BP: (!) 141/80 (!) 148/81 (!) 156/81 (!) 160/77  Pulse: (!)  52 (!) 55  (!) 58  Resp: 18 16 18 18   Temp: 98.1 F (36.7 C) 97.9 F (36.6 C) 99.1 F (37.3 C)   TempSrc:  Oral Oral   SpO2: 100% 99% 97% 100%  Weight:        Intake/Output Summary (Last 24 hours) at 09/29/2023 1552 Last data filed at 09/29/2023 1300 Gross per 24 hour  Intake 997.31 ml  Output --  Net 997.31 ml   Filed Weights   09/28/23 1219  Weight: 83.9 kg    Data Reviewed: I have personally reviewed and interpreted daily labs, tele strips, imagings as discussed above. I reviewed all nursing notes, pharmacy notes, vitals, pertinent old records I have discussed plan of care as described above with RN and patient/family.  CBC: Recent Labs  Lab 09/28/23 1230 09/29/23 0540  WBC 4.5 4.5  HGB 12.4* 12.4*  HCT 36.7* 36.4*  MCV 93.1 92.6  PLT 164 155   Basic Metabolic Panel: Recent Labs  Lab 09/28/23 1230 09/29/23 0540 09/29/23 1120  NA 137 141  --   K 4.7 4.5  --   CL 103 108  --   CO2 27 27  --   GLUCOSE 267* 71  --  BUN 32* 25*  --   CREATININE 2.17* 2.10*  --   CALCIUM  8.4* 8.2*  --   MG  --   --  1.7  PHOS  --   --  2.7    Studies: MR BRAIN WO CONTRAST Result Date: 09/29/2023 CLINICAL DATA:  83 year old male with left face and upper extremity numbness, headache, TIA. EXAM: MRI HEAD WITHOUT CONTRAST TECHNIQUE: Multiplanar, multiecho pulse sequences of the brain and surrounding structures were obtained without intravenous contrast. COMPARISON:  Head CT yesterday.  Brain MRI 12/03/2022. FINDINGS: Brain: Stable cerebral volume since last year. No restricted diffusion to suggest acute infarction. No midline shift, mass effect, evidence of mass lesion, ventriculomegaly, extra-axial collection or acute intracranial hemorrhage. Cervicomedullary junction and pituitary are within normal limits. Chronic cerebellar infarcts are stable from last year, most confluent in the left PICA territory. Small area of chronic left middle frontal gyrus cerebral cortical  encephalomalacia redemonstrated series 11, image 39. No other supratentorial cortical encephalomalacia identified. No convincing chronic cerebral blood products on mildly motion degraded SWI. Mild for age superimposed scattered cerebral white matter T2 and FLAIR hyperintensity. Deep gray nuclei are negative. Mild for age T2 heterogeneity in the pons is stable. Vascular: Major intracranial vascular flow voids are stable from the MRI last year. Skull and upper cervical spine: Chronic cervical spine hardware fusion artifact. Normal background bone marrow signal. No evidence of acute osseous abnormality. Sinuses/Orbits: Pronounced paranasal sinus disease, as detailed on head CT yesterday and not significantly changed by MRI since last year. Chronically inspissated material in the right sphenoid sinus. Stable and negative orbits. Other: Benign left forehead scalp lipoma is chronic and stable. Mastoids remain clear. IMPRESSION: 1. No acute intracranial abnormality. 2. Stable noncontrast MRI appearance of the brain since last year. Chronic cerebellar infarcts, subtle chronic left frontal lobe cortical infarct, mild for age chronic signal changes in the cerebral white matter and pons. 3. Chronic paranasal sinus disease as detailed on Head CT yesterday. Electronically Signed   By: VEAR Hurst M.D.   On: 09/29/2023 08:31    Scheduled Meds:  apixaban   5 mg Oral BID   diclofenac  Sodium  2 g Topical TID   escitalopram   20 mg Oral QHS   fluticasone   1 spray Each Nare Daily   insulin  aspart  0-15 Units Subcutaneous TID WC   insulin  aspart  0-5 Units Subcutaneous QHS   pregabalin   300 mg Oral BID   tamsulosin   0.4 mg Oral QPC supper   Continuous Infusions:  sodium chloride  100 mL/hr at 09/29/23 1300   cefTRIAXone  (ROCEPHIN )  IV Stopped (09/29/23 1057)   PRN Meds: acetaminophen  **OR** acetaminophen , bisacodyl , HYDROcodone -acetaminophen , hydrOXYzine , meclizine , ondansetron  **OR** ondansetron  (ZOFRAN ) IV,  senna-docusate  Time spent: 35 minutes  Author: ELVAN SOR. MD [facility] 09/29/2023 3:52 PM  To reach On-call, see care teams to locate the attending and reach out to them via [email or web address]. If 7PM-7AM, please contact night-coverage If you still have difficulty reaching the attending provider, please page the [facility] (Director on Call) for [facility] on amion for assistance.

## 2023-09-29 NOTE — Evaluation (Signed)
 Physical Therapy Evaluation Patient Details Name: Steve Brown MRN: [id number] DOB: [birth year] Today's Date: 09/29/2023  History of Present Illness  83 y.o. male presents to [facility] 09/28/23 with one week of L facial numbness and L arm weakness/numbness. MRI negative, + UTI. PMHx: CVA, TIA, CAD, T2DM, chronic diastolic HF, anxiety and depression, BPH, prostate cancer s/p TURB and seeding, lumbar radiculopathy, s/p neck fusion, chronic back pain, hereditary and idiopathic neuropathy, OSA, history of PE, chronic sinusitis CKD 3A   Clinical Impression  PTA, pt was independent for mobility with either no AD or SP cane when having sciatic pain in L LE. Pt presents close to baseline with reports of shooting pain in L LE and impaired balance/gait pattern. Pt was able to ambulate 371ft with supervision for safety and no AD. Also able to negotiate 6 steps with one handrail and supervision. Pt scored a 16/24 on the DGI indicating that pt is at an increased risk of falling. Pt was receiving OP PT prior to admit with recommendation to continue services. Pt will have intermittent help available upon returning home. Reported feeling comfortable discharging home whenever medically stable. Will follow acutely to address deficits listed below.       If plan is discharge home, recommend the following: Assist for transportation;Help with stairs or ramp for entrance   Can travel by private vehicle    Yes    Equipment Recommendations None recommended by PT     Functional Status Assessment Patient has had a recent decline in their functional status and demonstrates the ability to make significant improvements in function in a reasonable and predictable amount of time.     Precautions / Restrictions Precautions Precautions: Fall Recall of Precautions/Restrictions: Intact Restrictions Weight Bearing Restrictions Per Provider Order: No      Mobility  Bed Mobility Overal bed mobility: Modified Independent    Transfers Overall transfer level: Modified independent Equipment used: None   Ambulation/Gait Ambulation/Gait assistance: Supervision Gait Distance (Feet): 300 Feet Assistive device: None Gait Pattern/deviations: Antalgic, Decreased stance time - left, Decreased step length - left Gait velocity: decr    General Gait Details: antalgic gait pattern with increased medial/lateral sway related to decreased WB and step length on LLE. Hx of radiculopathy with increased pain when ambulating  Stairs Stairs: Yes Stairs assistance: Supervision Stair Management: One rail Right, One rail Left, Step to pattern, Forwards Number of Stairs: 6 General stair comments: step to pattern with 1UE support and supervision for safety    Balance Overall balance assessment: Needs assistance Sitting-balance support: No upper extremity supported, Feet supported Sitting balance-Leahy Scale: Normal     Standing balance support: No upper extremity supported Standing balance-Leahy Scale: Fair    Standardized Balance Assessment Standardized Balance Assessment : Dynamic Gait Index   Dynamic Gait Index Level Surface: Mild Impairment Change in Gait Speed: Moderate Impairment Gait with Horizontal Head Turns: Mild Impairment Gait with Vertical Head Turns: Mild Impairment Gait and Pivot Turn: Normal Step Over Obstacle: Mild Impairment Step Around Obstacles: Normal Steps: Moderate Impairment Total Score: 16       Pertinent Vitals/Pain Pain Assessment Pain Assessment: Faces Faces Pain Scale: Hurts even more Pain Location: L LE Pain Descriptors / Indicators: Shooting, Discomfort, Grimacing, Guarding Pain Intervention(s): Limited activity within patient's tolerance, Monitored during session, Repositioned    Home Living Family/patient expects to be discharged to:: Private residence Living Arrangements: Alone Available Help at Discharge: Family;Available PRN/intermittently Type of Home: Apartment Home  Access: Stairs to enter Entrance Stairs-Rails: Right Entrance  Stairs-Number of Steps: 16   Home Layout: One level Home Equipment: Agricultural consultant (2 wheels);Cane - single point      Prior Function Prior Level of Function : Independent/Modified Independent;Driving (goes to outpt PT 2x/wk; 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[employer])    Mobility Comments: Ind with occasional use of SP cane       Extremity/Trunk Assessment   Upper Extremity Assessment Upper Extremity Assessment: Defer to OT evaluation LUE Deficits / Details: AROM overall WFL however shoulder strength @ 3+/5 at baseline due to previous cervical surgery; reports more numbness/tingling, however using funcitonally LUE Sensation: decreased light touch    Lower Extremity Assessment Lower Extremity Assessment: LLE deficits/detail LLE Deficits / Details: reports hx of radiculopathy with shooting pain at rest and with movement LLE Sensation: WNL    Cervical / Trunk Assessment Cervical / Trunk Assessment: Normal  Communication   Communication Communication: No apparent difficulties    Cognition Arousal: Alert Behavior During Therapy: WFL for tasks assessed/performed   PT - Cognitive impairments: No apparent impairments    Following commands: Intact       Cueing Cueing Techniques: Verbal cues      PT Assessment Patient needs continued PT services  PT Problem List Decreased activity tolerance;Decreased balance;Decreased mobility       PT Treatment Interventions DME instruction;Gait training;Stair training;Functional mobility training;Therapeutic activities;Therapeutic exercise;Balance training;Neuromuscular re-education;Patient/family education    PT Goals (Current goals can be found in the Care Plan section)  Acute Rehab PT Goals Patient Stated Goal: to keep working on balance PT Goal Formulation: With patient Time For Goal Achievement: 10/13/23 Potential to Achieve Goals: Good    Frequency Min  1X/week        AM-PAC PT 6 Clicks Mobility  Outcome Measure Help needed turning from your back to your side while in a flat bed without using bedrails?: None Help needed moving from lying on your back to sitting on the side of a flat bed without using bedrails?: None Help needed moving to and from a bed to a chair (including a wheelchair)?: A Little Help needed standing up from a chair using your arms (e.g., wheelchair or bedside chair)?: None Help needed to walk in hospital room?: A Little Help needed climbing 3-5 steps with a railing? : A Little 6 Click Score: 21    End of Session Equipment Utilized During Treatment: Gait belt Activity Tolerance: Patient tolerated treatment well Patient left: in bed;with call bell/phone within reach Nurse Communication: Mobility status PT Visit Diagnosis: Unsteadiness on feet (R26.81);Other abnormalities of gait and mobility (R26.89)    Time: 1417-1440 PT Time Calculation (min) (ACUTE ONLY): 23 min   Charges:   PT Evaluation $PT Eval Low Complexity: 1 Low   PT General Charges $$ ACUTE PT VISIT: 1 Visit       Kate ORN, PT, DPT Secure Chat Preferred  Rehab Office [phone]   Kate BRAVO Wendolyn 09/29/2023, 2:52 PM

## 2023-09-29 NOTE — Plan of Care (Signed)
 On-call neurology note  Called by the hospitalist regarding this patient with 1 week of left-sided numbness.  Prior history of strokes.  MRI negative for stroke.  UA positive for UTI.  Likely recrudescence of old symptoms.  No need for inpatient stroke workup or neurological evaluation inpatient.  This was relayed to Dr. Von Eligio Lav, MD Neurology

## 2023-09-29 NOTE — Progress Notes (Signed)
 Pt is off from unit for MRI

## 2023-09-29 NOTE — Evaluation (Signed)
 Occupational Therapy Evaluation Patient Details Name: Steve Brown MRN: [id number] DOB: Oct 22, 1940 Today's Date: 09/29/2023   History of Present Illness   83 y.o. male presents to [facility] 09/28/23 with one week of L facial numbness and L arm weakness/numbness. MRI negative, + UTI. PMHx: CVA, TIA, CAD, T2DM, chronic diastolic HF, anxiety and depression, BPH, prostate cancer s/p TURB and seeding, lumbar radiculopathy, s/p neck fusion, chronic back pain, hereditary and idiopathic neuropathy, OSA, history of PE, chronic sinusitis CKD 3A     Clinical Impressions PTA pt lives at home independently and his children check on him intermittently. Mr Valliant is very active, goes to the [employer], volunteers at the [employer] and is participating in outpt PT 2x/wk for balance and strengthening. Pt has a fall alert system but does not report recent falls. Pt continues to complain of LUE numbness different from his baseline however he is close to his baseline functionally. Acute OT to follow to facilitate safe DC home. No OT follow up is recommended after DC.      If plan is discharge home, recommend the following:         Functional Status Assessment   Patient has not had a recent decline in their functional status     Equipment Recommendations   None recommended by OT     Recommendations for Other Services         Precautions/Restrictions   Precautions Precautions: Fall Restrictions Weight Bearing Restrictions Per Provider Order: No     Mobility Bed Mobility Overal bed mobility: Modified Independent                  Transfers Overall transfer level: Modified independent                        Balance Overall balance assessment: Mild deficits observed, not formally tested (able to complete 10 STS repeatedly without difficulty or use of hands to push up)                                         ADL either performed or assessed with clinical judgement    ADL Overall ADL's : At baseline                                             Vision Baseline Vision/History: 1 Wears glasses Ability to See in Adequate Light: 0 Adequate       Perception         Praxis         Pertinent Vitals/Pain Pain Assessment Pain Assessment: No/denies pain     Extremity/Trunk Assessment Upper Extremity Assessment Upper Extremity Assessment: LUE deficits/detail;Right hand dominant LUE Deficits / Details: AROM overall WFL however shoulder strength @ 3+/5 at baseline due to previous cervical surgery; reports more numbness/tingling, however using funcitonally LUE Sensation: decreased light touch   Lower Extremity Assessment Lower Extremity Assessment: Defer to PT evaluation   Cervical / Trunk Assessment Cervical / Trunk Assessment: Normal   Communication     Cognition Arousal: Alert Behavior During Therapy: [facility] for tasks assessed/performed Cognition: No apparent impairments, No family/caregiver present to determine baseline (most likely baseline; scored a 4/28 on the Short Blessed test demosntrating difficulty with STM, which he states is his baseline)  Following commands: Intact       Cueing  General Comments          Exercises     Shoulder Instructions      Home Living Family/patient expects to be discharged to:: Private residence Living Arrangements: Alone Available Help at Discharge: Family;Available PRN/intermittently Type of Home: Apartment Home Access: Stairs to enter Entrance Stairs-Number of Steps: 16 Entrance Stairs-Rails: Right Home Layout: One level     Bathroom Shower/Tub: Chief Strategy Officer: Standard Bathroom Accessibility: Yes How Accessible: Accessible via walker Home Equipment: Rolling Walker (2 wheels);Cane - single point          Prior Functioning/Environment Prior Level of Function : Independent/Modified  Independent;Driving (goes to outpt PT 2x/wk; 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[employer])                    OT Problem List: Impaired sensation   OT Treatment/Interventions: Self-care/ADL training;DME and/or AE instruction;Therapeutic activities;Patient/family education;Balance training      OT Goals(Current goals can be found in the care plan section)   Acute Rehab OT Goals Patient Stated Goal: home OT Goal Formulation: With patient Time For Goal Achievement: 10/13/23 Potential to Achieve Goals: Good   OT Frequency:  Min 2X/week    Co-evaluation              AM-PAC OT 6 Clicks Daily Activity     Outcome Measure Help from another person eating meals?: None Help from another person taking care of personal grooming?: None Help from another person toileting, which includes using toliet, bedpan, or urinal?: None Help from another person bathing (including washing, rinsing, drying)?: None Help from another person to put on and taking off regular upper body clothing?: None Help from another person to put on and taking off regular lower body clothing?: None 6 Click Score: 24   End of Session Equipment Utilized During Treatment: Gait belt Nurse Communication: Mobility status  Activity Tolerance: Patient tolerated treatment well Patient left: in chair;with call bell/phone within reach;with chair alarm set  OT Visit Diagnosis: Other symptoms and signs involving the nervous system (R29.898)                Time: 1235-1300 OT Time Calculation (min): 25 min Charges:  OT General Charges $OT Visit: 1 Visit OT Evaluation $OT Eval Low Complexity: 1 Low  Kreg Sink, OT/L   Acute OT Clinical Specialist Acute Rehabilitation Services Pager [phone] Office [phone]   [facility] 09/29/2023, 2:01 PM

## 2023-09-29 NOTE — Plan of Care (Signed)
  Problem: Education: Goal: Knowledge of General Education information will improve Description: Including pain rating scale, medication(s)/side effects and non-pharmacologic comfort measures Outcome: Progressing   Problem: Health Behavior/Discharge Planning: Goal: Ability to manage health-related needs will improve Outcome: Progressing   Problem: Clinical Measurements: Goal: Cardiovascular complication will be avoided Outcome: Progressing   Problem: Activity: Goal: Risk for activity intolerance will decrease Outcome: Progressing   Problem: Nutrition: Goal: Adequate nutrition will be maintained Outcome: Progressing   Problem: Pain Managment: Goal: General experience of comfort will improve and/or be controlled Outcome: Progressing   Problem: Safety: Goal: Ability to remain free from injury will improve Outcome: Progressing   Problem: Tissue Perfusion: Goal: Adequacy of tissue perfusion will improve Outcome: Progressing

## 2023-09-29 NOTE — Care Management Obs Status (Signed)
 MEDICARE OBSERVATION STATUS NOTIFICATION   Patient Details  Name: Akin Yi MRN: [id number] Date of Birth: [birth year]   Medicare Observation Status Notification Given:  Yes    Jon Cruel 09/29/2023, 11:17 AM

## 2023-09-30 DIAGNOSIS — R200 Anesthesia of skin: Secondary | ICD-10-CM | POA: Diagnosis not present

## 2023-09-30 DIAGNOSIS — R202 Paresthesia of skin: Secondary | ICD-10-CM | POA: Diagnosis not present

## 2023-09-30 LAB — URINE CULTURE: Culture: >=100000 — AB

## 2023-09-30 LAB — CBC
HCT: 35.6 % — ABNORMAL LOW (ref 39.0–52.0)
Hemoglobin: 12.1 g/dL — ABNORMAL LOW (ref 13.0–17.0)
MCH: 30.9 pg (ref 26.0–34.0)
MCHC: 34 g/dL (ref 30.0–36.0)
MCV: 90.8 fL (ref 80.0–100.0)
Platelets: 151 K/uL (ref 150–400)
RBC: 3.92 MIL/uL — ABNORMAL LOW (ref 4.22–5.81)
RDW: 12.8 % (ref 11.5–15.5)
WBC: 4.4 K/uL (ref 4.0–10.5)
nRBC: 0 % (ref 0.0–0.2)

## 2023-09-30 LAB — BASIC METABOLIC PANEL WITH GFR
Anion gap: 9 (ref 5–15)
BUN: 25 mg/dL — ABNORMAL HIGH (ref 8–23)
CO2: 24 mmol/L (ref 22–32)
Calcium: 8.2 mg/dL — ABNORMAL LOW (ref 8.9–10.3)
Chloride: 103 mmol/L (ref 98–111)
Creatinine, Ser: 1.97 mg/dL — ABNORMAL HIGH (ref 0.61–1.24)
GFR, Estimated: 34 mL/min — ABNORMAL LOW (ref >60)
Glucose, Bld: 176 mg/dL — ABNORMAL HIGH (ref 70–99)
Potassium: 4.3 mmol/L (ref 3.5–5.1)
Sodium: 136 mmol/L (ref 135–145)

## 2023-09-30 LAB — GLUCOSE, CAPILLARY
Glucose-Capillary: 179 mg/dL — ABNORMAL HIGH (ref 70–99)
Glucose-Capillary: 166 mg/dL — ABNORMAL HIGH (ref 70–99)

## 2023-09-30 LAB — MAGNESIUM: Magnesium: 1.9 mg/dL (ref 1.7–2.4)

## 2023-09-30 LAB — PHOSPHORUS: Phosphorus: 3 mg/dL (ref 2.5–4.6)

## 2023-09-30 MED ORDER — AMOXICILLIN-POT CLAVULANATE 500-125 MG PO TABS: 500.0000 mg | ORAL_TABLET | Freq: Two times a day (BID) | ORAL | 0 refills | Status: AC

## 2023-09-30 MED ORDER — FOSFOMYCIN TROMETHAMINE 3 G PO PACK: 3.0000 g | PACK | Freq: Once | ORAL | Status: DC

## 2023-09-30 MED ORDER — TAMSULOSIN HCL 0.4 MG PO CAPS: 0.4000 mg | ORAL_CAPSULE | Freq: Every day | ORAL | 11 refills | Status: AC

## 2023-09-30 NOTE — Plan of Care (Addendum)
 0400 Pt has episodes of confusion, been asking several times who brought him in ED [place] and here at [facility]. Though if ask to assess his orientation he can answer it correctly, no changes on his baseline weakness as assessed and is also redirectable after reorienting him. No complaints of any pain. PRN anxiety meds were given. MD Opyd made aware.   Problem: Health Behavior/Discharge Planning: Goal: Ability to manage health-related needs will improve Outcome: Progressing   Problem: Clinical Measurements: Goal: Diagnostic test results will improve Outcome: Progressing   Problem: Clinical Measurements: Goal: Cardiovascular complication will be avoided Outcome: Progressing   Problem: Nutrition: Goal: Adequate nutrition will be maintained Outcome: Progressing   Problem: Coping: Goal: Level of anxiety will decrease Outcome: Progressing   Problem: Elimination: Goal: Will not experience complications related to urinary retention Outcome: Progressing   Problem: Safety: Goal: Ability to remain free from injury will improve Outcome: Progressing

## 2023-09-30 NOTE — Discharge Summary (Signed)
 [facility] Discharge Summary   Patient: Steve Brown FMW:[id number]  PCP: [facility]  Date of admission: 09/28/2023   Date of discharge:  09/30/2023     Discharge Diagnoses:  Principal Problem:   Left sided numbness Active Problems:   Obstructive sleep apnea   Left facial numbness   Left arm numbness   Admitted From: Home Disposition:  Home   Recommendations for Outpatient Follow-up:  Follow-up with PCP in 1 week, continue to monitor BP at home and follow with PCP to titrate medication accordingly. Repeat BMP after 1 week to check renal functions Follow-up with urology for possible BPH and right hydrocele, patient will need scrotal sonogram as an outpatient. Follow-up with spine surgery as an outpatient for steroid injection for chronic DJD pain control Follow up LABS/TEST:  BMP in 1 wk   Follow-up Information     Cl[facility], [facility] Follow up in 1 week(s).   Contact information: [address] [facility] [place] [address] [phone]         Cam Morene ORN, MD Follow up in 1 week(s).   Specialty: Urology Why: for BPH and Right scrotal swelling possible varicocele/hydrocele? needs scrotal ultrasound. Contact information: [address] [place] [address] [phone]                Diet recommendation: Cardiac and Carb modified diet  Activity: The patient is advised to gradually reintroduce usual activities, as tolerated  Discharge Condition: stable  Code Status: Full code   History of present illness: As per the H and P dictated on admission.  Hospital Course:  Steve Brown is a 83 y.o. male with medical history significant for CVA, TIA, CAD, T2DM, chronic diastolic HF, anxiety and depression, BPH, prostate cancer s/p TURB and seeding, lumbar radiculopathy, s/p neck fusion, chronic back pain, hereditary and idiopathic neuropathy, OSA, history of PE, chronic sinusitis CKD 3A who presented to [facility] ED for  evaluation of left facial numbness and left arm weakness/numbness. Patient reports he has been dealing with worsening of his chronic sinusitis with sinus headache, nasal congestion and facial pain. On Sunday, he started experiencing some numbness and weakness in his left arm as well as the left side of his face. Over the last week, the symptoms have been persistent without any improvement.  He continues to feel that his left arm is slightly weaker than his right but denies any vision changes, dizziness, chest pain, shortness of breath, abdominal pain, nausea, vomiting, fever, chills or dysuria.   [facility] ED Course: Initial vitals show patient afebrile, HR 50s-60s, SBP 110-140s, SpO2 97% on room air. Initial labs significant for creatinine 2.17, glucose 267, WBC 4.5, Hgb 12.4, negative flu, RSV and COVID test, UA with moderate hemoglobinuria, positive nitrite, large leuk, WBC >50 and many bacteria.  CT head with no acute intracranial abnormality shows chronic Sphenoid sinusitis.  Pt received IV NS 1 L bolus and IV Rocephin .  Patient was admitted to [facility] service and transferred to [facility].   Assessment and Plan:   # Left-sided numbness # Left arm weakness # Hx of CVA and TIA - Patient with history of multiple strokes presented with 1 week of persistent left facial numbness and left arm weakness/numbness - Patient with no focal neurodeficits on exam, sensation remains intact and no focal weakness. - Patient does have a history of heavy territory and idiopathic neuropathy but unclear if this is contributing to his presenting symptoms - CT head with no acute intracranial abnormality - MRI  brain:  1. No acute intracranial abnormality. 2. Stable noncontrast MRI appearance of the brain since last year. Chronic cerebellar infarcts, subtle chronic left frontal lobe cortical infarct, mild for age chronic signal changes in the cerebral white matter and pons. 3. Chronic paranasal sinus disease as detailed on Head CT  yesterday. D/w neurology rec no need of stroke workup, most likely symptoms are due to UTI Vitamin B12 413 and vitamin D  within normal range.  # Cervical stenosis # Chronic backache due to cervical and lumbar degenerative disc disease. H/o cervical corpectomy and fusion in 2008 in [place] .  severe degenerative L L4-5 arthropathy  As per patient he always has problems with the left arm, weakness and numbness off and on and using multiple pain medications. Patient also received spinal injections, he is following with the spine surgery.  Last steroid injection was 3 months ago. Patient was advised to follow-up with spine surgery for steroid injection as an outpatient.   # Acute cystitis - Patient with history of UTIs found to have signs of infection on urinalysis - Last urine culture 3 months ago grew Klebsiella pneumonia sensitive to Rocephin  S/p IV rocephin , urine culture growing Klebsiella, pansensitive Transition to oral antibiotics Augmentin  for 5 days.   # BPH, resumed Proscar  Started Flomax  9/20 bladder scan 182 mL postvoid As per patient repeat bladder scan was done and he is not retaining significant amount of urine. Complaining of scrotal swelling, on exam shows right-sided testicular swelling possible varicocele versus hydrocele, patient was advised to follow with urology for BPH and for testicular sorting as an outpatient.  Patient will need scrotal sonogram.    # T2DM with hyperglycemia - Last A1c 10.1% 1 year ago, blood glucose elevated to 267 on CMP - SSI with meals, CBG monitoring Held home medications during hospital stay. HbA1c 9.4 S/p NovoLog  sliding scale during hospital stay.  Resumed home regimen on discharge, patient was advised to monitor CBG at home and continue diabetic diet.  Continue Lyrica  home dose   # AKI on CKD 3A - Creatinine elevated to 2.17, from baseline of around 1.2-1.4 - Likely secondary to mild dehydration - s/p IV NS at 100 cc/h sCr 1.97,  still elevated but improved.  Patient was advised to continue oral hydration.  Follow with PCP to repeat BMP after 1 week.     # Chronic diastolic HF and CAD, HTN: Resumed losartan  and Eliquis  # Hx of PE: on Eliquis  # Sinus headache # Chronic sinusitis - Tylenol  as needed for headache # OSA - CPAP at bedtime    Body mass index is 30.79 kg/m.  Nutrition Interventions:  Pain control  Continued home dose Norco - Patient was instructed, not to drive, operate heavy machinery, perform activities at heights, swimming or participation in water activities or provide baby sitting services while on Pain, Sleep and Anxiety Medications; until his outpatient Physician has advised to do so again.  - Also recommended to not to take more than prescribed Pain, Sleep and Anxiety Medications.  Patient was seen by physical therapy, who recommended no therapy needed on discharge, pt has intermittent help available at home and he is comfortable discharging home. On the day of the discharge the patient's vitals were stable, and no other acute medical condition were reported by patient. the patient was felt safe to be discharge at Home with no therapy needed on discharge.  Consultants: Neurology Procedures: None  Discharge Exam: General: Appear in no distress, Oral Mucosa Clear, moist. Cardiovascular: S1  and S2 Present, no Murmur, Respiratory: normal respiratory effort, Bilateral Air entry present and no Crackles, no wheezes Abdomen: Bowel Sound present, Soft and no tenderness.   Extremities: no Pedal edema, no calf tenderness Neurology: alert and oriented to time, place, and person affect appropriate.  Filed Weights   09/28/23 1219  Weight: 83.9 kg   Vitals:   09/30/23 0745 09/30/23 1136  BP: 105/65 (!) 142/74  Pulse: (!) 58 (!) 55  Resp: 18 18  Temp: 97.9 F (36.6 C) 97.6 F (36.4 C)  SpO2: 94% 100%    DISCHARGE MEDICATION: Allergies as of 09/30/2023       Reactions   Other Other (See  Comments)   Per patient- cardiac cath dye-  woke up during procedure hysterical.   Phenergan  [promethazine ] Other (See Comments)   Mood changes    Iohexol  Other (See Comments)   Patient refuses contrast after having a hysterical event in hospital //r ls spoke with patient    Phenergan  [promethazine ]    Other reaction(s): syncope   Topiramate Other (See Comments)   Causes hallucinations   Tramadol     Increases Blood Sugar         Medication List     TAKE these medications    amoxicillin -clavulanate 500-125 MG tablet Commonly known as: Augmentin  Take 1 tablet by mouth 2 (two) times daily for 5 days. Start taking on: October 01, 2023   apixaban  5 MG Tabs tablet Commonly known as: ELIQUIS  Take 5 mg by mouth 2 (two) times daily.   busPIRone  5 MG tablet Commonly known as: BUSPAR  Take 5 mg by mouth 3 (three) times daily.   cetirizine  5 MG tablet Commonly known as: ZYRTEC  Take 2 tablets (10 mg total) by mouth daily. What changed:  how much to take when to take this reasons to take this   diclofenac  Sodium 1 % Gel Commonly known as: VOLTAREN  Apply 4 g topically 4 (four) times daily.   donepezil 10 MG tablet Commonly known as: ARICEPT Take 10 mg by mouth daily.   escitalopram  20 MG tablet Commonly known as: LEXAPRO  Take 20 mg by mouth in the morning.   finasteride  5 MG tablet Commonly known as: PROSCAR  Take 5 mg by mouth daily.   furosemide  20 MG tablet Commonly known as: LASIX  Take 1-2 tablets (20-40 mg total) by mouth daily as needed for fluid.   HYDROcodone -acetaminophen  5-325 MG tablet Commonly known as: NORCO/VICODIN Take 1 tablet by mouth daily. What changed:  when to take this reasons to take this   hydrOXYzine  25 MG capsule Commonly known as: VISTARIL  Take 25 mg by mouth daily.   insulin  aspart 100 UNIT/ML FlexPen Commonly known as: NOVOLOG  Inject 5 Units into the skin 3 (three) times daily with meals. What changed: additional  instructions   ipratropium 0.06 % nasal spray Commonly known as: ATROVENT Place 2 sprays into both nostrils as needed for rhinitis.   losartan  50 MG tablet Commonly known as: COZAAR  Take 50 mg by mouth daily.   meclizine  25 MG tablet Commonly known as: ANTIVERT  Take 1 tablet (25 mg total) by mouth 2 (two) times daily as needed for dizziness.   nystatin cream Commonly known as: MYCOSTATIN Apply 1 Application topically as needed for rash.   pregabalin  300 MG capsule Commonly known as: LYRICA  Take 300 mg by mouth 2 (two) times daily.   tamsulosin  0.4 MG Caps capsule Commonly known as: FLOMAX  Take 1 capsule (0.4 mg total) by mouth daily after supper.  Tresiba  FlexTouch 100 UNIT/ML FlexTouch Pen Generic drug: insulin  degludec Inject 34 Units into the skin daily. What changed: how much to take   Vitamin D -1000 Max St 25 MCG (1000 UT) tablet Generic drug: Cholecalciferol  Take 1,000 Units by mouth daily.       Allergies  Allergen Reactions   Other Other (See Comments)    Per patient- cardiac cath dye-  woke up during procedure hysterical.   Phenergan  [Promethazine ] Other (See Comments)    Mood changes    Iohexol  Other (See Comments)    Patient refuses contrast after having a hysterical event in hospital //r ls spoke with patient    Phenergan  [Promethazine ]     Other reaction(s): syncope   Topiramate Other (See Comments)    Causes hallucinations   Tramadol      Increases Blood Sugar    Discharge Instructions     Call MD for:   Complete by: As directed    Weakness or numbness   Call MD for:  difficulty breathing, headache or visual disturbances   Complete by: As directed    Call MD for:  extreme fatigue   Complete by: As directed    Call MD for:  persistant dizziness or light-headedness   Complete by: As directed    Call MD for:  persistant nausea and vomiting   Complete by: As directed    Call MD for:  severe uncontrolled pain   Complete by: As directed     Call MD for:  temperature >100.4   Complete by: As directed    Diet renal/carb modified with fluid restriction   Complete by: As directed    Discharge instructions   Complete by: As directed    Follow-up with PCP in 1 week, continue to monitor BP at home and follow with PCP to titrate medication accordingly. Repeat BMP after 1 week to check renal functions Follow-up with urology for possible BPH and right hydrocele, patient will need scrotal sonogram as an outpatient. Follow-up with spine surgery as an outpatient for steroid injection for chronic DJD pain control   Increase activity slowly   Complete by: As directed        The results of significant diagnostics from this hospitalization (including imaging, microbiology, ancillary and laboratory) are listed below for reference.    Significant Diagnostic Studies: MR BRAIN WO CONTRAST Result Date: 09/29/2023 CLINICAL DATA:  83 year old male with left face and upper extremity numbness, headache, TIA. EXAM: MRI HEAD WITHOUT CONTRAST TECHNIQUE: Multiplanar, multiecho pulse sequences of the brain and surrounding structures were obtained without intravenous contrast. COMPARISON:  Head CT yesterday.  Brain MRI 12/03/2022. FINDINGS: Brain: Stable cerebral volume since last year. No restricted diffusion to suggest acute infarction. No midline shift, mass effect, evidence of mass lesion, ventriculomegaly, extra-axial collection or acute intracranial hemorrhage. Cervicomedullary junction and pituitary are within normal limits. Chronic cerebellar infarcts are stable from last year, most confluent in the left PICA territory. Small area of chronic left middle frontal gyrus cerebral cortical encephalomalacia redemonstrated series 11, image 39. No other supratentorial cortical encephalomalacia identified. No convincing chronic cerebral blood products on mildly motion degraded SWI. Mild for age superimposed scattered cerebral white matter T2 and FLAIR  hyperintensity. Deep gray nuclei are negative. Mild for age T2 heterogeneity in the pons is stable. Vascular: Major intracranial vascular flow voids are stable from the MRI last year. Skull and upper cervical spine: Chronic cervical spine hardware fusion artifact. Normal background bone marrow signal. No evidence of acute osseous abnormality.  Sinuses/Orbits: Pronounced paranasal sinus disease, as detailed on head CT yesterday and not significantly changed by MRI since last year. Chronically inspissated material in the right sphenoid sinus. Stable and negative orbits. Other: Benign left forehead scalp lipoma is chronic and stable. Mastoids remain clear. IMPRESSION: 1. No acute intracranial abnormality. 2. Stable noncontrast MRI appearance of the brain since last year. Chronic cerebellar infarcts, subtle chronic left frontal lobe cortical infarct, mild for age chronic signal changes in the cerebral white matter and pons. 3. Chronic paranasal sinus disease as detailed on Head CT yesterday. Electronically Signed   By: VEAR Hurst M.D.   On: 09/29/2023 08:31   CT Head Wo Contrast Result Date: 09/28/2023 CLINICAL DATA:  Left-sided face and arm numbness over the last week. Soreness of the left neck. EXAM: CT HEAD WITHOUT CONTRAST TECHNIQUE: Contiguous axial images were obtained from the base of the skull through the vertex without intravenous contrast. RADIATION DOSE REDUCTION: This exam was performed according to the departmental dose-optimization program which includes automated exposure control, adjustment of the mA and/or kV according to patient size and/or use of iterative reconstruction technique. COMPARISON:  06/15/2023 FINDINGS: Brain: Age related volume loss. Chronic small-vessel ischemic change of the white matter. No sign of acute infarction, mass lesion, hemorrhage, hydrocephalus or extra-axial collection. 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 Vascular: There is atherosclerotic  calcification of the major vessels at the base of the brain. Skull: Negative Sinuses/Orbits: Inflammatory disease of the sphenoid sinus, right worse than left, with chronic mucoperiosteal changes. This could be a cause of headache. 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 Findings are similar to the prior study. Other: None IMPRESSION: 1. No acute intracranial finding. Age related volume loss. Chronic small-vessel ischemic change of the white matter. 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 2. Chronic sphenoid sinusitis, right worse than left, with chronic mucoperiosteal changes. This could be a cause of headache. 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 Findings are similar to the prior study. Electronically Signed   By: Oneil Officer M.D.   On: 09/28/2023 14:51    Microbiology: Recent Results (from the past 240 hours)  Resp panel by RT-PCR (RSV, Flu A&B, Covid) Anterior Nasal Swab     Status: None   Collection Time: 09/28/23 12:22 PM   Specimen: Anterior Nasal Swab  Result Value Ref Range Status   SARS Coronavirus 2 by RT PCR NEGATIVE NEGATIVE Final    Comment: (NOTE) SARS-CoV-2 target nucleic acids are NOT DETECTED.  The SARS-CoV-2 RNA is generally detectable in upper respiratory specimens during the acute phase of infection. The lowest concentration of SARS-CoV-2 viral copies this assay can detect is 138 copies/mL. A negative result does not preclude SARS-Cov-2 infection and should not be used as the sole basis for treatment or other patient management decisions. A negative result may occur with  improper specimen collection/handling, submission of specimen other than nasopharyngeal swab, presence of viral mutation(s) within the areas targeted by this assay, and inadequate number of viral copies(<138 copies/mL). A negative result must be combined with clinical observations, patient history, and epidemiological information. The expected result is Negative.  Fact Sheet  for Patients:  BloggerCourse.com  Fact Sheet for Healthcare Providers:  SeriousBroker.it  This test is no t yet approved or cleared by the [place]  FDA and  has been authorized for detection and/or diagnosis of SARS-CoV-2 by FDA under an Emergency Use Authorization (EUA). This EUA will remain  in effect (meaning this test can  be used) for the duration of the COVID-19 declaration under Section 564(b)(1) of the Act, 21 U.S.C.section 360bbb-3(b)(1), unless the authorization is terminated  or revoked sooner.       Influenza A by PCR NEGATIVE NEGATIVE Final   Influenza B by PCR NEGATIVE NEGATIVE Final    Comment: (NOTE) The Xpert Xpress SARS-CoV-2/FLU/RSV plus assay is intended as an aid in the diagnosis of influenza from Nasopharyngeal swab specimens and should not be used as a sole basis for treatment. Nasal washings and aspirates are unacceptable for Xpert Xpress SARS-CoV-2/FLU/RSV testing.  Fact Sheet for Patients: BloggerCourse.com  Fact Sheet for Healthcare Providers: SeriousBroker.it  This test is not yet approved or cleared by the [place]  FDA and has been authorized for detection and/or diagnosis of SARS-CoV-2 by FDA under an Emergency Use Authorization (EUA). This EUA will remain in effect (meaning this test can be used) for the duration of the COVID-19 declaration under Section 564(b)(1) of the Act, 21 U.S.C. section 360bbb-3(b)(1), unless the authorization is terminated or revoked.     Resp Syncytial Virus by PCR NEGATIVE NEGATIVE Final    Comment: (NOTE) Fact Sheet for Patients: BloggerCourse.com  Fact Sheet for Healthcare Providers: SeriousBroker.it  This test is not yet approved or cleared by the [place]  FDA and has been authorized for detection and/or diagnosis of SARS-CoV-2 by FDA under an  Emergency Use Authorization (EUA). This EUA will remain in effect (meaning this test can be used) for the duration of the COVID-19 declaration under Section 564(b)(1) of the Act, 21 U.S.C. section 360bbb-3(b)(1), unless the authorization is terminated or revoked.  Performed at [facility] [address] [place], [place] [address]   Urine Culture     Status: Abnormal   Collection Time: 09/28/23  2:55 PM   Specimen: Urine, Clean Catch  Result Value Ref Range Status   Specimen Description   Final    URINE, CLEAN CATCH Performed at [facility], [address] [place], [place] [address]    Special Requests   Final    NONE Performed at [facility], [address] [place], [place] [address]    Culture >=100,000 COLONIES/mL KLEBSIELLA PNEUMONIAE (A)  Final   Report Status 09/30/2023 FINAL  Final   Organism ID, Bacteria KLEBSIELLA PNEUMONIAE (A)  Final      Susceptibility   Klebsiella pneumoniae - MIC*    AMPICILLIN RESISTANT Resistant     CEFAZOLIN  (URINE) Value in next row Sensitive      2 SENSITIVEThis is a modified FDA-approved test that has been validated and its performance characteristics determined by the reporting laboratory.  This laboratory is certified under the Clinical Laboratory Improvement Amendments CLIA as qualified to perform high complexity clinical laboratory testing.    CEFEPIME Value in next row Sensitive      2 SENSITIVEThis is a modified FDA-approved test that has been validated and its performance characteristics determined by the reporting laboratory.  This laboratory is certified under the Clinical Laboratory Improvement Amendments CLIA as qualified to perform high complexity clinical laboratory testing.    ERTAPENEM Value in next row Sensitive      2 SENSITIVEThis is a modified FDA-approved test that has been validated and its performance characteristics determined by the reporting laboratory.  This laboratory is certified  under the Clinical Laboratory Improvement Amendments CLIA as qualified to perform high complexity clinical laboratory testing.    CEFTRIAXONE  Value in next row Sensitive  2 SENSITIVEThis is a modified FDA-approved test that has been validated and its performance characteristics determined by the reporting laboratory.  This laboratory is certified under the Clinical Laboratory Improvement Amendments CLIA as qualified to perform high complexity clinical laboratory testing.    CIPROFLOXACIN  Value in next row Sensitive      2 SENSITIVEThis is a modified FDA-approved test that has been validated and its performance characteristics determined by the reporting laboratory.  This laboratory is certified under the Clinical Laboratory Improvement Amendments CLIA as qualified to perform high complexity clinical laboratory testing.    GENTAMICIN Value in next row Sensitive      2 SENSITIVEThis is a modified FDA-approved test that has been validated and its performance characteristics determined by the reporting laboratory.  This laboratory is certified under the Clinical Laboratory Improvement Amendments CLIA as qualified to perform high complexity clinical laboratory testing.    NITROFURANTOIN  Value in next row Intermediate      2 SENSITIVEThis is a modified FDA-approved test that has been validated and its performance characteristics determined by the reporting laboratory.  This laboratory is certified under the Clinical Laboratory Improvement Amendments CLIA as qualified to perform high complexity clinical laboratory testing.    TRIMETH /SULFA  Value in next row Sensitive      2 SENSITIVEThis is a modified FDA-approved test that has been validated and its performance characteristics determined by the reporting laboratory.  This laboratory is certified under the Clinical Laboratory Improvement Amendments CLIA as qualified to perform high complexity clinical laboratory testing.    AMPICILLIN/SULBACTAM Value in next  row Sensitive      2 SENSITIVEThis is a modified FDA-approved test that has been validated and its performance characteristics determined by the reporting laboratory.  This laboratory is certified under the Clinical Laboratory Improvement Amendments CLIA as qualified to perform high complexity clinical laboratory testing.    PIP/TAZO Value in next row Sensitive      <=4 SENSITIVEThis is a modified FDA-approved test that has been validated and its performance characteristics determined by the reporting laboratory.  This laboratory is certified under the Clinical Laboratory Improvement Amendments CLIA as qualified to perform high complexity clinical laboratory testing.    MEROPENEM Value in next row Sensitive      <=4 SENSITIVEThis is a modified FDA-approved test that has been validated and its performance characteristics determined by the reporting laboratory.  This laboratory is certified under the Clinical Laboratory Improvement Amendments CLIA as qualified to perform high complexity clinical laboratory testing.    * >=100,000 COLONIES/mL KLEBSIELLA PNEUMONIAE     Labs: CBC: Recent Labs  Lab 09/28/23 1230 09/29/23 0540 09/30/23 0621  WBC 4.5 4.5 4.4  HGB 12.4* 12.4* 12.1*  HCT 36.7* 36.4* 35.6*  MCV 93.1 92.6 90.8  PLT 164 155 151   Basic Metabolic Panel: Recent Labs  Lab 09/28/23 1230 09/29/23 0540 09/29/23 1120 09/30/23 0621  NA 137 141  --  136  K 4.7 4.5  --  4.3  CL 103 108  --  103  CO2 27 27  --  24  GLUCOSE 267* 71  --  176*  BUN 32* 25*  --  25*  CREATININE 2.17* 2.10*  --  1.97*  CALCIUM  8.4* 8.2*  --  8.2*  MG  --   --  1.7 1.9  PHOS  --   --  2.7 3.0   Liver Function Tests: Recent Labs  Lab 09/28/23 1230  AST 28  ALT 20  ALKPHOS 107  BILITOT 0.5  PROT 6.5  ALBUMIN 3.8   No results for input(s): LIPASE, AMYLASE in the last 168 hours. No results for input(s): AMMONIA in the last 168 hours. Cardiac Enzymes: No results for input(s): CKTOTAL,  CKMB, CKMBINDEX, TROPONINI in the last 168 hours. BNP (last 3 results) No results for input(s): BNP in the last 8760 hours. CBG: Recent Labs  Lab 09/29/23 1646 09/29/23 2057 09/29/23 2229 09/30/23 0620 09/30/23 1159  GLUCAP 199* 205* 180* 179* 166*    Time spent: 35 minutes  Signed:  Elvan Sor  [facility] 09/30/2023 2:26 PM

## 2023-10-04 ENCOUNTER — Telehealth: Payer: Self-pay

## 2023-10-04 DIAGNOSIS — Z79899 Other long term (current) drug therapy: Secondary | ICD-10-CM

## 2023-10-08 DIAGNOSIS — N1831 Chronic kidney disease, stage 3a: Secondary | ICD-10-CM | POA: Diagnosis not present

## 2023-10-08 DIAGNOSIS — Z6832 Body mass index (BMI) 32.0-32.9, adult: Secondary | ICD-10-CM | POA: Diagnosis not present

## 2023-10-08 DIAGNOSIS — N390 Urinary tract infection, site not specified: Secondary | ICD-10-CM | POA: Diagnosis not present

## 2023-10-08 DIAGNOSIS — R531 Weakness: Secondary | ICD-10-CM | POA: Diagnosis not present

## 2023-10-08 DIAGNOSIS — Z23 Encounter for immunization: Secondary | ICD-10-CM | POA: Diagnosis not present

## 2023-10-08 DIAGNOSIS — R2689 Other abnormalities of gait and mobility: Secondary | ICD-10-CM | POA: Diagnosis not present

## 2023-10-08 DIAGNOSIS — R202 Paresthesia of skin: Secondary | ICD-10-CM | POA: Diagnosis not present

## 2023-10-08 DIAGNOSIS — Z09 Encounter for follow-up examination after completed treatment for conditions other than malignant neoplasm: Secondary | ICD-10-CM | POA: Diagnosis not present

## 2023-10-08 DIAGNOSIS — I679 Cerebrovascular disease, unspecified: Secondary | ICD-10-CM | POA: Diagnosis not present

## 2023-10-08 DIAGNOSIS — E1140 Type 2 diabetes mellitus with diabetic neuropathy, unspecified: Secondary | ICD-10-CM | POA: Diagnosis not present

## 2023-10-08 DIAGNOSIS — R899 Unspecified abnormal finding in specimens from other organs, systems and tissues: Secondary | ICD-10-CM | POA: Diagnosis not present

## 2023-10-08 DIAGNOSIS — I10 Essential (primary) hypertension: Secondary | ICD-10-CM | POA: Diagnosis not present

## 2023-10-09 DIAGNOSIS — E1140 Type 2 diabetes mellitus with diabetic neuropathy, unspecified: Secondary | ICD-10-CM | POA: Diagnosis not present

## 2023-10-09 DIAGNOSIS — I10 Essential (primary) hypertension: Secondary | ICD-10-CM | POA: Diagnosis not present

## 2023-10-09 DIAGNOSIS — N183 Chronic kidney disease, stage 3 unspecified: Secondary | ICD-10-CM | POA: Diagnosis not present

## 2023-10-09 DIAGNOSIS — E782 Mixed hyperlipidemia: Secondary | ICD-10-CM | POA: Diagnosis not present

## 2023-10-10 ENCOUNTER — Telehealth: Payer: Self-pay | Admitting: Pharmacist

## 2023-10-10 NOTE — Progress Notes (Signed)
   10/10/2023  Patient ID: Steve Brown, male   DOB: 12-17-1940, 83 y.o.   MRN: [id number]  Received referral from Dr. Okey regarding medication adherence and confusion.  Recent hospitalization from 9/19 to 9/21 due to stroke-like symptoms (prior history). Diagnosed with UTI and BPH. Prescribed Augmentin  and Tamsulosin  (already on Finasteride ).   Fill history from [facility]:  New= Augmentin , Tamsulosin  (long-term); rest may be coming from the [place]  Finasteride  9/15 90DS Hydro-acet 9/15 30DS Xarelto  7/8 90DS Losartan  7/1 90DS- due Valtrex  6/28 90DS- due *Both should have refills available  eGFR drop from 56 in July 2024 to 33 in Sept 2025 A1c 9/20= 9.4% (needs BG control!!!); supposed to be meeting with Endo  Called and spoke with the patient on the phone today. Reports he has picked up both medications and has been taking them. Believes the tamsulosin  works sometimes to help fully clear his bladder. Meets with spinal clinic and urology tomorrow. No concerns for Dr. Okey at this time. Advised we would like to complete a med review to see exactly what he is taking since several come from the [place] and day supply may be off from recent 2-day hospitalization. Offered to do a phone visit for med review with his bottles or to have him bring them in at [facility] on 10/7. Advised he would prefer to bring them in on 10/7 for review. Seemed a little concerned about it being at [facility] stating that he would have to stay up all night to attend, but said it was too late to change. Agreed to reminder call at 8AM to bring medications to visit.   Aloysius Lewis, PharmD [facility]  Phone Number: [phone]

## 2023-10-11 DIAGNOSIS — N390 Urinary tract infection, site not specified: Secondary | ICD-10-CM | POA: Diagnosis not present

## 2023-10-11 DIAGNOSIS — M533 Sacrococcygeal disorders, not elsewhere classified: Secondary | ICD-10-CM | POA: Diagnosis not present

## 2023-10-11 DIAGNOSIS — C61 Malignant neoplasm of prostate: Secondary | ICD-10-CM | POA: Diagnosis not present

## 2023-10-11 DIAGNOSIS — R319 Hematuria, unspecified: Secondary | ICD-10-CM | POA: Diagnosis not present

## 2023-10-16 DIAGNOSIS — K219 Gastro-esophageal reflux disease without esophagitis: Secondary | ICD-10-CM | POA: Diagnosis not present

## 2023-10-16 DIAGNOSIS — Z0000 Encounter for general adult medical examination without abnormal findings: Secondary | ICD-10-CM | POA: Diagnosis not present

## 2023-10-16 DIAGNOSIS — A6000 Herpesviral infection of urogenital system, unspecified: Secondary | ICD-10-CM | POA: Diagnosis not present

## 2023-10-16 DIAGNOSIS — I10 Essential (primary) hypertension: Secondary | ICD-10-CM | POA: Diagnosis not present

## 2023-10-16 DIAGNOSIS — E1140 Type 2 diabetes mellitus with diabetic neuropathy, unspecified: Secondary | ICD-10-CM | POA: Diagnosis not present

## 2023-10-16 DIAGNOSIS — J309 Allergic rhinitis, unspecified: Secondary | ICD-10-CM | POA: Diagnosis not present

## 2023-10-16 DIAGNOSIS — Z6831 Body mass index (BMI) 31.0-31.9, adult: Secondary | ICD-10-CM | POA: Diagnosis not present

## 2023-10-16 DIAGNOSIS — E782 Mixed hyperlipidemia: Secondary | ICD-10-CM | POA: Diagnosis not present

## 2023-10-16 DIAGNOSIS — R5381 Other malaise: Secondary | ICD-10-CM | POA: Diagnosis not present

## 2023-10-16 DIAGNOSIS — F411 Generalized anxiety disorder: Secondary | ICD-10-CM | POA: Diagnosis not present

## 2023-10-16 DIAGNOSIS — I69354 Hemiplegia and hemiparesis following cerebral infarction affecting left non-dominant side: Secondary | ICD-10-CM | POA: Diagnosis not present

## 2023-10-16 DIAGNOSIS — Z23 Encounter for immunization: Secondary | ICD-10-CM | POA: Diagnosis not present

## 2023-10-17 DIAGNOSIS — Z79891 Long term (current) use of opiate analgesic: Secondary | ICD-10-CM | POA: Diagnosis not present

## 2023-10-17 DIAGNOSIS — M5116 Intervertebral disc disorders with radiculopathy, lumbar region: Secondary | ICD-10-CM | POA: Diagnosis not present

## 2023-10-17 DIAGNOSIS — M1612 Unilateral primary osteoarthritis, left hip: Secondary | ICD-10-CM | POA: Diagnosis not present

## 2023-10-17 DIAGNOSIS — M25552 Pain in left hip: Secondary | ICD-10-CM | POA: Diagnosis not present

## 2023-10-17 DIAGNOSIS — G8929 Other chronic pain: Secondary | ICD-10-CM | POA: Diagnosis not present

## 2023-10-17 DIAGNOSIS — M47816 Spondylosis without myelopathy or radiculopathy, lumbar region: Secondary | ICD-10-CM | POA: Diagnosis not present

## 2023-10-17 DIAGNOSIS — M545 Low back pain, unspecified: Secondary | ICD-10-CM | POA: Diagnosis not present

## 2023-10-18 DIAGNOSIS — M542 Cervicalgia: Secondary | ICD-10-CM | POA: Diagnosis not present

## 2023-10-18 DIAGNOSIS — R270 Ataxia, unspecified: Secondary | ICD-10-CM | POA: Diagnosis not present

## 2023-10-18 DIAGNOSIS — R2681 Unsteadiness on feet: Secondary | ICD-10-CM | POA: Diagnosis not present

## 2023-10-23 ENCOUNTER — Encounter: Payer: Self-pay | Admitting: Psychology

## 2023-10-25 ENCOUNTER — Emergency Department (EMERGENCY_DEPARTMENT_HOSPITAL)
Admission: EM | Admit: 2023-10-25 | Discharge: 2023-10-25 | Disposition: A | Discharge status: Home / Self Care | Source: Home / Self Care | Attending: Emergency Medicine | Admitting: Emergency Medicine

## 2023-10-25 ENCOUNTER — Encounter (HOSPITAL_COMMUNITY): Payer: Self-pay

## 2023-10-25 ENCOUNTER — Emergency Department (HOSPITAL_COMMUNITY)
Admission: EM | Admit: 2023-10-25 | Discharge: 2023-10-26 | Disposition: A | Discharge status: Home / Self Care | Attending: Emergency Medicine | Admitting: Emergency Medicine

## 2023-10-25 ENCOUNTER — Emergency Department (HOSPITAL_COMMUNITY)

## 2023-10-25 DIAGNOSIS — I2510 Atherosclerotic heart disease of native coronary artery without angina pectoris: Secondary | ICD-10-CM | POA: Diagnosis not present

## 2023-10-25 DIAGNOSIS — Z8673 Personal history of transient ischemic attack (TIA), and cerebral infarction without residual deficits: Secondary | ICD-10-CM | POA: Diagnosis not present

## 2023-10-25 DIAGNOSIS — Z8546 Personal history of malignant neoplasm of prostate: Secondary | ICD-10-CM | POA: Insufficient documentation

## 2023-10-25 DIAGNOSIS — M79602 Pain in left arm: Secondary | ICD-10-CM | POA: Insufficient documentation

## 2023-10-25 DIAGNOSIS — Z7901 Long term (current) use of anticoagulants: Secondary | ICD-10-CM | POA: Insufficient documentation

## 2023-10-25 DIAGNOSIS — I130 Hypertensive heart and chronic kidney disease with heart failure and stage 1 through stage 4 chronic kidney disease, or unspecified chronic kidney disease: Secondary | ICD-10-CM | POA: Insufficient documentation

## 2023-10-25 DIAGNOSIS — I5032 Chronic diastolic (congestive) heart failure: Secondary | ICD-10-CM | POA: Insufficient documentation

## 2023-10-25 DIAGNOSIS — Z7984 Long term (current) use of oral hypoglycemic drugs: Secondary | ICD-10-CM | POA: Insufficient documentation

## 2023-10-25 DIAGNOSIS — Z794 Long term (current) use of insulin: Secondary | ICD-10-CM | POA: Diagnosis not present

## 2023-10-25 DIAGNOSIS — E1122 Type 2 diabetes mellitus with diabetic chronic kidney disease: Secondary | ICD-10-CM | POA: Insufficient documentation

## 2023-10-25 DIAGNOSIS — R001 Bradycardia, unspecified: Secondary | ICD-10-CM | POA: Diagnosis not present

## 2023-10-25 DIAGNOSIS — M542 Cervicalgia: Secondary | ICD-10-CM | POA: Insufficient documentation

## 2023-10-25 DIAGNOSIS — M5412 Radiculopathy, cervical region: Secondary | ICD-10-CM | POA: Diagnosis not present

## 2023-10-25 DIAGNOSIS — N1831 Chronic kidney disease, stage 3a: Secondary | ICD-10-CM | POA: Diagnosis not present

## 2023-10-25 DIAGNOSIS — I6522 Occlusion and stenosis of left carotid artery: Secondary | ICD-10-CM

## 2023-10-25 LAB — CBC WITH DIFFERENTIAL/PLATELET
Abs Immature Granulocytes: 0.01 K/uL (ref 0.00–0.07)
Basophils Absolute: 0 K/uL (ref 0.0–0.1)
Basophils Relative: 0 %
Eosinophils Absolute: 0 K/uL (ref 0.0–0.5)
Eosinophils Relative: 0 %
HCT: 40 % (ref 39.0–52.0)
Hemoglobin: 13.1 g/dL (ref 13.0–17.0)
Immature Granulocytes: 0 %
Lymphocytes Relative: 29 %
Lymphs Abs: 1.7 K/uL (ref 0.7–4.0)
MCH: 30.4 pg (ref 26.0–34.0)
MCHC: 32.8 g/dL (ref 30.0–36.0)
MCV: 92.8 fL (ref 80.0–100.0)
Monocytes Absolute: 0.5 K/uL (ref 0.1–1.0)
Monocytes Relative: 9 %
Neutro Abs: 3.6 K/uL (ref 1.7–7.7)
Neutrophils Relative %: 62 %
Platelets: 148 K/uL — ABNORMAL LOW (ref 150–400)
RBC: 4.31 MIL/uL (ref 4.22–5.81)
RDW: 12.5 % (ref 11.5–15.5)
WBC: 5.8 K/uL (ref 4.0–10.5)
nRBC: 0 % (ref 0.0–0.2)

## 2023-10-25 LAB — BASIC METABOLIC PANEL WITH GFR
Anion gap: 9 (ref 5–15)
BUN: 25 mg/dL — ABNORMAL HIGH (ref 8–23)
CO2: 28 mmol/L (ref 22–32)
Calcium: 9.1 mg/dL (ref 8.9–10.3)
Chloride: 102 mmol/L (ref 98–111)
Creatinine, Ser: 1.66 mg/dL — ABNORMAL HIGH (ref 0.61–1.24)
GFR, Estimated: 41 mL/min — ABNORMAL LOW (ref >60)
Glucose, Bld: 170 mg/dL — ABNORMAL HIGH (ref 70–99)
Potassium: 4.2 mmol/L (ref 3.5–5.1)
Sodium: 139 mmol/L (ref 135–145)

## 2023-10-25 LAB — PROTIME-INR
INR: 1 (ref 0.8–1.2)
Prothrombin Time: 13.9 s (ref 11.4–15.2)

## 2023-10-25 LAB — TROPONIN T, HIGH SENSITIVITY
Troponin T High Sensitivity: 55 ng/L — ABNORMAL HIGH (ref 0–19)
Troponin T High Sensitivity: 55 ng/L — ABNORMAL HIGH (ref 0–19)

## 2023-10-25 LAB — CBG MONITORING, ED: Glucose-Capillary: 163 mg/dL — ABNORMAL HIGH (ref 70–99)

## 2023-10-25 MED ORDER — METHOCARBAMOL 500 MG PO TABS
500.0000 mg | ORAL_TABLET | Freq: Once | ORAL | Status: AC
  Administered 2023-10-25: 500 mg via ORAL
  Filled 2023-10-25: qty 1

## 2023-10-25 MED ORDER — OXYCODONE HCL 5 MG PO TABS
5.0000 mg | ORAL_TABLET | Freq: Once | ORAL | Status: AC
  Administered 2023-10-25: 5 mg via ORAL
  Filled 2023-10-25: qty 1

## 2023-10-25 MED ORDER — KETOROLAC TROMETHAMINE 15 MG/ML IJ SOLN
15.0000 mg | Freq: Once | INTRAMUSCULAR | Status: AC
  Administered 2023-10-25: 15 mg via INTRAVENOUS
  Filled 2023-10-25: qty 1

## 2023-10-25 MED ORDER — LIDOCAINE 5 % EX PTCH
1.0000 | MEDICATED_PATCH | CUTANEOUS | Status: DC
  Administered 2023-10-25: 1 via TRANSDERMAL
  Filled 2023-10-25: qty 1

## 2023-10-25 MED ORDER — ACETAMINOPHEN 500 MG PO TABS
1000.0000 mg | ORAL_TABLET | Freq: Once | ORAL | Status: AC
  Administered 2023-10-25: 1000 mg via ORAL
  Filled 2023-10-25: qty 2

## 2023-10-25 NOTE — ED Provider Notes (Signed)
 [facility] EMERGENCY DEPARTMENT AT [facility] Provider Note   CSN: [id number] Arrival date & time: 10/25/23  1342     History  Chief Complaint  Patient presents with  . Arm Pain    Steve Brown is a 83 y.o. male with CVA, TIA, CAD, T2DM, chronic diastolic HF, anxiety and depression, BPH, prostate cancer s/p TURB and seeding, lumbar radiculopathy, s/p neck fusion, chronic back pain, hereditary and idiopathic neuropathy, OSA, history of PE, and CKD 3A who presents with pain in the left arm that radiates up into this neck and left side of his head. Has chronic pain in left shoulder that he normally gets injections for but this is Similar but different pain and worse than normal. Doesn't normally have pain in his neck or the side of his head. Having some pain with twisting of the head as well. No visual changes. No numbness/tingling, Endorses some midline neck pain but denies any falls/head trauma/neck trauma. No facial droop, slurred speech, trouble swallowing, f/c, chest pain, cough, nausea/vomiting. Pain with movement of the arm/shoulder that is chronic. Has questioned some swelling in the left hand. Recently was admitted from 09/28/23-09/30/23 for left-sided numbness/left facial numbness. CTH showed chronic sphenoid sinusitis and had +UTI. MRI showed chronic cerebellar infarcts and chronic left frontal lobe infarct but no acute infarcts. Also w/ cervical stenosis s/p cervical corpectomy and fusion in 2008 in [place] .    Past Medical History:  Diagnosis Date  . Abnormal prostate biopsy   . Anticoagulant long-term use    currently xarelto   . Anxiety   . BPH with elevated PSA   . CKD (chronic kidney disease), stage II   . Complication of anesthesia    limted neck rom limited use of left arm due to cva  . Coronary artery disease    CARDIOLOGIST-  DR DANN--  2010-- PCI w/ stenting midLAD  . DDD (degenerative disc disease), lumbar   . Degeneration of cervical  intervertebral disc   . Depression   . Diabetes mellitus without complication (HCC)    Type 2  . Dyspnea on exertion   . GERD (gastroesophageal reflux disease)   . Hemiparesis due to cerebral infarction   . History of cerebrovascular accident (CVA) with residual deficit 2002 and 2003--  hemiparisis both sides   per MRI  anterior left frontal lobe, left para midline pons, and inferior cerebullam bilaterally infarcts  . History of pulmonary embolus (PE)    06-30-2012  extensive bilaterally  . History of recurrent TIAs   . History of syncope    hx multiple pre-syncope and syncopal episodes due to vasovagal, orthostatic hypotension, dehydration  . History of TIAs    several since 2002  . Hyperlipidemia   . Hypertension   . Mild atherosclerosis of carotid artery, bilateral    per last duplex 11-04-2014  bilateral ICA 1--39%  . Neuropathy    fingers  . OSA on CPAP    uses most nights; followed by dr dohmeier--  sev. osa w/ AHI 65.9  . Pneumonia    x 1  . Prostate cancer (HCC) dx 2018  . Renal insufficiency   . S/P coronary artery stent placement 2010   stenting to mid LAD  . Sensorineural hearing loss, bilateral 04/01/2021   no hearing aids  . Simple renal cyst    bilaterally  . Stroke (HCC)   . Trigger finger of both hands 11/17/2013  . Type 2 diabetes mellitus (HCC) dx 1986   last  one A1c 9.2 on 04-26-2016  . Unsteady gait    uses straight cane and occasional walker. Hx prior CVA/TIAs;  . Vertebral artery occlusion, left    chronic       Home Medications Prior to Admission medications   Medication Sig Start Date End Date Taking? Authorizing Provider  apixaban  (ELIQUIS ) 5 MG TABS tablet Take 5 mg by mouth 2 (two) times daily. 08/31/23   [provider]  busPIRone  (BUSPAR ) 5 MG tablet Take 5 mg by mouth 3 (three) times daily.    [provider]  cetirizine  (ZYRTEC ) 5 MG tablet Take 2 tablets (10 mg total) by mouth daily. Patient taking differently: Take  5 mg by mouth as needed for allergies. 10/23/22   Suellen Cantor A, PA-C  Cholecalciferol  (VITAMIN D -1000 MAX ST) 25 MCG (1000 UT) tablet Take 1,000 Units by mouth daily.    [provider]  diclofenac  Sodium (VOLTAREN ) 1 % GEL Apply 4 g topically 4 (four) times daily. 09/03/19   Floyd, Dan, DO  donepezil (ARICEPT) 10 MG tablet Take 10 mg by mouth daily. 11/17/22   [provider]  escitalopram  (LEXAPRO ) 20 MG tablet Take 20 mg by mouth in the morning.    [provider]  finasteride  (PROSCAR ) 5 MG tablet Take 5 mg by mouth daily.    [provider]  furosemide  (LASIX ) 20 MG tablet Take 1-2 tablets (20-40 mg total) by mouth daily as needed for fluid. 06/13/20   Medina-Vargas, Monina C, NP  HYDROcodone -acetaminophen  (NORCO/VICODIN) 5-325 MG tablet Take 1 tablet by mouth daily. Patient taking differently: Take 1 tablet by mouth every 4 (four) hours as needed for moderate pain (pain score 4-6). 07/24/23     hydrOXYzine  (VISTARIL ) 25 MG capsule Take 25 mg by mouth daily.    [provider]  insulin  aspart (NOVOLOG ) 100 UNIT/ML FlexPen Inject 5 Units into the skin 3 (three) times daily with meals. Patient taking differently: Inject 5 Units into the skin 3 (three) times daily with meals. Plus a sliding scale depending on B/G levels 06/13/20   Medina-Vargas, Monina C, NP  ipratropium (ATROVENT) 0.06 % nasal spray Place 2 sprays into both nostrils as needed for rhinitis.    [provider]  losartan  (COZAAR ) 50 MG tablet Take 50 mg by mouth daily.    [provider]  meclizine  (ANTIVERT ) 25 MG tablet Take 1 tablet (25 mg total) by mouth 2 (two) times daily as needed for dizziness. 08/09/22   Countryman, Chase, MD  nystatin cream (MYCOSTATIN) Apply 1 Application topically as needed for rash. 06/15/21   [provider]  pregabalin  (LYRICA ) 300 MG capsule Take 300 mg by mouth 2 (two) times daily. 04/26/20   [provider]  tamsulosin   (FLOMAX ) 0.4 MG CAPS capsule Take 1 capsule (0.4 mg total) by mouth daily after supper. 09/30/23 09/29/24  Von Bellis, MD  TRESIBA  FLEXTOUCH 100 UNIT/ML FlexTouch Pen Inject 34 Units into the skin daily. Patient taking differently: Inject 30 Units into the skin daily. 06/13/20   Medina-Vargas, Monina C, NP      Allergies    Other, Phenergan  [promethazine ], Iohexol , Phenergan  [promethazine ], Topiramate, and Tramadol     Review of Systems   Review of Systems A 10 point review of systems was performed and is negative unless otherwise reported in HPI.  Physical Exam Updated Vital Signs BP (!) 182/87 (BP Location: Left Arm)   Pulse (!) 54   Temp 98.2 F (36.8 C) (Oral)   Resp 19  SpO2 99%  Physical Exam General: Normal appearing elderly male, lying in bed.  HEENT: PERRLA, EOMI, sclera anicteric, MMM, trachea midline.  Temperature is midline.  No masses palpated in the neck.  Positive left paraspinal muscular tenderness to palpation in the left side of the neck.  Positive left trapezius tenderness on palpation. Cardiology: RRR, no murmurs/rubs/gallops. BL radial and DP pulses equal bilaterally.  Resp: Normal respiratory rate and effort. CTAB, no wheezes, rhonchi, crackles.  Abd: Soft, non-tender, non-distended. No rebound tenderness or guarding.  GU: Deferred. MSK: General tenderness outpatient on the left shoulder without any overlying deformity or signs of trauma.  Mild tenderness outpatient in the palpation of the left arm diffusely without any deformities, signs of trauma, erythema/induration/fluctuance.  Intact radial pulse, full strength in all joints, full range of motion in all joints.  Pain with active abduction and passive abduction of the left shoulder.  No peripheral edema noted. Skin: warm, dry. No rashes or lesions. Back: + Midline C-spine tenderness with patient without any deformities or step-offs Neuro: A&Ox4, CNs II-XII grossly intact. MAEs. Sensation grossly intact.  Psych:  Normal mood and affect.   ED Results / Procedures / Treatments   Labs (all labs ordered are listed, but only abnormal results are displayed) Labs Reviewed  CBC WITH DIFFERENTIAL/PLATELET - Abnormal; Notable for the following components:      Result Value   Platelets 148 (*)    All other components within normal limits  BASIC METABOLIC PANEL WITH GFR - Abnormal; Notable for the following components:   Glucose, Bld 170 (*)    BUN 25 (*)    Creatinine, Ser 1.66 (*)    GFR, Estimated 41 (*)    All other components within normal limits  CBG MONITORING, ED - Abnormal; Notable for the following components:   Glucose-Capillary 163 (*)    All other components within normal limits  TROPONIN T, HIGH SENSITIVITY - Abnormal; Notable for the following components:   Troponin T High Sensitivity 55 (*)    All other components within normal limits  TROPONIN T, HIGH SENSITIVITY - Abnormal; Notable for the following components:   Troponin T High Sensitivity 55 (*)    All other components within normal limits  PROTIME-INR    EKG None  Radiology UE VENOUS DUPLEX (7am - 7pm) Result Date: 10/25/2023 UPPER VENOUS STUDY  Patient Name:  NAHZIR POHLE  Date of Exam:   10/25/2023 Medical Rec #: [id number]         Accession #:    [id number] Date of Birth: July 08, 1940          Patient Gender: M Patient Age:   73 years Exam [facility]  [facility] Procedure:      VAS US  UPPER EXTREMITY VENOUS DUPLEX Referring Phys: Jauna Raczynski --------------------------------------------------------------------------------  Risk Factors: None identified. Comparison Study: No prior studies. Performing Technologist: Cordella Collet RVT  Examination Guidelines: A complete evaluation includes B-mode imaging, spectral Doppler, color Doppler, and power Doppler as needed of all accessible portions of each vessel. Bilateral testing is considered an integral part of a complete examination. Limited examinations for reoccurring  indications may be performed as noted.  Right Findings: +----------+------------+---------+-----------+----------+-------+ RIGHT     CompressiblePhasicitySpontaneousPropertiesSummary +----------+------------+---------+-----------+----------+-------+ Subclavian               Yes       Yes                      +----------+------------+---------+-----------+----------+-------+  Left Findings: +----------+------------+---------+-----------+----------+-------+  LEFT      CompressiblePhasicitySpontaneousPropertiesSummary +----------+------------+---------+-----------+----------+-------+ IJV           Full       Yes       Yes                      +----------+------------+---------+-----------+----------+-------+ Subclavian               Yes       Yes                      +----------+------------+---------+-----------+----------+-------+ Axillary      Full       Yes       Yes                      +----------+------------+---------+-----------+----------+-------+ Brachial      Full                                          +----------+------------+---------+-----------+----------+-------+ Radial        Full                                          +----------+------------+---------+-----------+----------+-------+ Ulnar         Full                                          +----------+------------+---------+-----------+----------+-------+ Cephalic      Full                                          +----------+------------+---------+-----------+----------+-------+ Basilic       Full                                          +----------+------------+---------+-----------+----------+-------+  Summary:  Right: No evidence of thrombosis in the subclavian.  Left: No evidence of deep vein thrombosis in the upper extremity. No evidence of superficial vein thrombosis in the upper extremity.  *See table(s) above for measurements and observations.  Diagnosing  physician: Debby Robertson Electronically signed by Debby Robertson on 10/25/2023 at 5:15:44 PM.    Final     Procedures Procedures    Medications Ordered in ED Medications  lidocaine  (LIDODERM ) 5 % 1 patch (1 patch Transdermal Patch Applied 10/25/23 1958)  acetaminophen  (TYLENOL ) tablet 1,000 mg (1,000 mg Oral Given 10/25/23 1957)  ketorolac  (TORADOL ) 15 MG/ML injection 15 mg (15 mg Intravenous Given 10/25/23 1958)  oxyCODONE  (Oxy IR/ROXICODONE ) immediate release tablet 5 mg (5 mg Oral Given 10/25/23 1957)  methocarbamol  (ROBAXIN ) tablet 500 mg (500 mg Oral Given 10/25/23 2149)    ED Course/ Medical Decision Making/ A&P                          Medical Decision Making Amount and/or Complexity of Data Reviewed Labs: ordered. Decision-making details documented in ED Course. Radiology: ordered.  Risk OTC drugs. Prescription drug management.  This patient presents to the ED for concern of left shoulder and neck pain, this involves an extensive number of treatment options, and is a complaint that carries with it a high risk of complications and morbidity.  I considered the following differential and admission for this acute, potentially life threatening condition.   MDM:    Patient presents with left arm pain.  No numbness tingling, intact distal pulses and sensation, no concern for acute arterial ischemia.  He has mild self-reported swelling in the left hand not appreciated on exam but it does have a recent admission and pain so obtained to left upper extremity DVT ultrasound which was also reassuringly negative.  Consider atypical ACS and obtained a troponin x 2 which was flat and stable.  Patient does have a long history of left shoulder pain for which he receives injections and also does have a history of cervical stenosis however he reports that this pain today is different than what he normally has and worse.  I have a high suspicion overall for musculoskeletal pain however he  states that this pain is different.  Performed shared decision making with the patient and will obtain a CTA head and neck for further evaluation and to ensure no vascular abnormalities.  CTA head and neck from 2022 he was noted to have a occlusion of the left vertebral artery at its origin with reconstitution of the distal P2 segment.  Patient has IV contrast listed among his allergies however it is actually a hysterical event in the hospital and had tolerated IV contrast since that was noted in 2021.  Patient was given Tylenol , Toradol , oxycodone , and a lidocaine  patch with some improvement in his symptoms.  Will also give Robaxin  and heat pack and reevaluate.  If the CTA is reassuring patient can be discharged with outpatient follow-up.  Clinical Course as of 10/26/23 0003  Thu Oct 25, 2023  2103 Troponin T High Sensitivity(!): 55 55 x2, stable/flat [HN]  2139 Reevaluated. Improved pain but still present in left shoulder, left side of neck, and left side of head. Will get CTA for further eval and give muscle realxer as well. No chest pain. [HN]  2142 SDM with patient, will move forward with CTA H&N [HN]  Fri Oct 26, 2023  0002 Prompted by radiology about patient's listed IV contrast allergy.  Discussed with the patient.  He had contrast listed in his chart as an allergy.  It the allergy was listed as a hysterical event.  When discussing with the patient it seems as though he flipped out due to some component of claustrophobia.  He had no rash, nausea vomiting, shortness of breath, wheezing, lightheadedness, or vital sign abnormalities.  Does not seem to be an actual allergy but rather an adverse event due to being enclosed in the CT scanner.  Additionally, patient had tolerated IV contrast in 2022 since that was listed in 2021.  Will remove this as an allergy. [HN]    Clinical Course User Index [HN] Franklyn Sid SAILOR, MD    Labs: I Ordered, and personally interpreted labs.  The pertinent  results include:  those listed above  Imaging Studies ordered: I ordered imaging studies including CTA H&N  Additional history obtained from chart review  Reevaluation: After the interventions noted above, I reevaluated the patient and found that they have :improved  Social Determinants of Health: .Lives independently  Disposition:  Patient is signed out to the oncoming ED physician Dr. Trine who is made aware of  his history, presentation, exam, workup, and plan.    Co morbidities that complicate the patient evaluation . Past Medical History:  Diagnosis Date  . Abnormal prostate biopsy   . Anticoagulant long-term use    currently xarelto   . Anxiety   . BPH with elevated PSA   . CKD (chronic kidney disease), stage II   . Complication of anesthesia    limted neck rom limited use of left arm due to cva  . Coronary artery disease    CARDIOLOGIST-  DR DANN--  2010-- PCI w/ stenting midLAD  . DDD (degenerative disc disease), lumbar   . Degeneration of cervical intervertebral disc   . Depression   . Diabetes mellitus without complication (HCC)    Type 2  . Dyspnea on exertion   . GERD (gastroesophageal reflux disease)   . Hemiparesis due to cerebral infarction   . History of cerebrovascular accident (CVA) with residual deficit 2002 and 2003--  hemiparisis both sides   per MRI  anterior left frontal lobe, left para midline pons, and inferior cerebullam bilaterally infarcts  . History of pulmonary embolus (PE)    06-30-2012  extensive bilaterally  . History of recurrent TIAs   . History of syncope    hx multiple pre-syncope and syncopal episodes due to vasovagal, orthostatic hypotension, dehydration  . History of TIAs    several since 2002  . Hyperlipidemia   . Hypertension   . Mild atherosclerosis of carotid artery, bilateral    per last duplex 11-04-2014  bilateral ICA 1--39%  . Neuropathy    fingers  . OSA on CPAP    uses most nights; followed by dr dohmeier--   sev. osa w/ AHI 65.9  . Pneumonia    x 1  . Prostate cancer (HCC) dx 2018  . Renal insufficiency   . S/P coronary artery stent placement 2010   stenting to mid LAD  . Sensorineural hearing loss, bilateral 04/01/2021   no hearing aids  . Simple renal cyst    bilaterally  . Stroke (HCC)   . Trigger finger of both hands 11/17/2013  . Type 2 diabetes mellitus (HCC) dx 1986   last one A1c 9.2 on 04-26-2016  . Unsteady gait    uses straight cane and occasional walker. Hx prior CVA/TIAs;  . Vertebral artery occlusion, left    chronic     Medicines Meds ordered this encounter  Medications  . acetaminophen  (TYLENOL ) tablet 1,000 mg  . ketorolac  (TORADOL ) 15 MG/ML injection 15 mg  . oxyCODONE  (Oxy IR/ROXICODONE ) immediate release tablet 5 mg    Refill:  0  . lidocaine  (LIDODERM ) 5 % 1 patch  . methocarbamol  (ROBAXIN ) tablet 500 mg    I have reviewed the patients home medicines and have made adjustments as needed  Problem List / ED Course: Problem List Items Addressed This Visit   None Visit Diagnoses       Left arm pain    -  Primary     Neck pain on left side                       This note was created using dictation software, which may contain spelling or grammatical errors.    Franklyn Sid SAILOR, MD 10/25/23 [phone]

## 2023-10-25 NOTE — Progress Notes (Signed)
 Left upper extremity venous duplex has been completed. Preliminary results can be found in CV Proc through chart review.  Results were given to Dr. Franklyn.  10/25/23 5:06 PM Cathlyn Collet RVT

## 2023-10-25 NOTE — ED Notes (Signed)
 Pt cleared for po by edp. Offered sandwich, Mcnellis crackers, and water. Denies any additional needs

## 2023-10-25 NOTE — ED Triage Notes (Addendum)
 Pt arrived via EMS, from home. C/o left arm pain x1 month, radiating up to head. Worsening pain x4 days ago. Denies any injury. Worse with mvmt. States pain in head has been happening since previous stroke. No new weakness. No SOB

## 2023-10-26 ENCOUNTER — Emergency Department (HOSPITAL_COMMUNITY)

## 2023-10-26 DIAGNOSIS — M79602 Pain in left arm: Secondary | ICD-10-CM | POA: Diagnosis not present

## 2023-10-26 DIAGNOSIS — I6523 Occlusion and stenosis of bilateral carotid arteries: Secondary | ICD-10-CM | POA: Diagnosis not present

## 2023-10-26 MED ORDER — METHOCARBAMOL 500 MG PO TABS: 500.0000 mg | ORAL_TABLET | Freq: Three times a day (TID) | ORAL | 0 refills | Status: AC | PRN

## 2023-10-26 MED ADMIN — Iohexol IV Soln 350 MG/ML: 60 mL | INTRAVENOUS | NDC 00407141491

## 2023-10-26 NOTE — ED Provider Notes (Signed)
 I assumed care of this patient from previous provider.  Please see their note for further details of history, exam, and MDM.   Briefly patient is a 83 y.o. male who presented left shoulder and arm pain favoring cervical radiculopathy vs muscular etiology. Pending CTA H/N to rule out dissection.  CTA negative for such. Notable atherosclerosis. Already on [facility].  The patient appears reasonably screened and/or stabilized for discharge and I doubt any other medical condition or other [facility] requiring further screening, evaluation, or treatment in the ED at this time. I have discussed the findings, Dx and Tx plan with the patient/family who expressed understanding and agree(s) with the plan. Discharge instructions discussed at length. The patient/family was given strict return precautions who verbalized understanding of the instructions. No further questions at time of discharge.  Disposition: Discharge  Condition: Good  ED Discharge Orders          Ordered    methocarbamol  (ROBAXIN ) 500 MG tablet  Every 8 hours PRN        10/26/23 0017             Follow Up: Clinic, [facility] [address] [facility] [place] [address] [phone]  Schedule an appointment as soon as possible for a visit in 1 week For follow-up  [facility] Emergency Department at [facility] [address] [place]  [address] [phone] Go to  As needed, If symptoms worsen          Oneal Biglow, Raynell Moder, MD 10/26/23 [phone]

## 2023-10-26 NOTE — ED Notes (Signed)
 Pts daughter Tiajuana present to take pt home . DC instructions discussed with her as well.

## 2023-10-29 ENCOUNTER — Ambulatory Visit: Admitting: Podiatry

## 2023-10-29 DIAGNOSIS — N183 Chronic kidney disease, stage 3 unspecified: Secondary | ICD-10-CM | POA: Diagnosis not present

## 2023-10-29 DIAGNOSIS — I10 Essential (primary) hypertension: Secondary | ICD-10-CM | POA: Diagnosis not present

## 2023-10-29 DIAGNOSIS — E1140 Type 2 diabetes mellitus with diabetic neuropathy, unspecified: Secondary | ICD-10-CM | POA: Diagnosis not present

## 2023-10-30 DIAGNOSIS — M25812 Other specified joint disorders, left shoulder: Secondary | ICD-10-CM | POA: Diagnosis not present

## 2023-10-31 LAB — LAB REPORT - SCANNED
Albumin, Urine POC: 181.6
Creatinine, POC: 168.4 mg/dL
EGFR: 33
Microalb Creat Ratio: 108

## 2023-11-08 ENCOUNTER — Emergency Department (HOSPITAL_COMMUNITY)
Admission: EM | Admit: 2023-11-08 | Discharge: 2023-11-08 | Disposition: A | Discharge status: Home / Self Care | Attending: Emergency Medicine | Admitting: Emergency Medicine

## 2023-11-08 DIAGNOSIS — M25512 Pain in left shoulder: Secondary | ICD-10-CM | POA: Insufficient documentation

## 2023-11-08 DIAGNOSIS — R519 Headache, unspecified: Secondary | ICD-10-CM | POA: Diagnosis not present

## 2023-11-08 DIAGNOSIS — G8929 Other chronic pain: Secondary | ICD-10-CM | POA: Insufficient documentation

## 2023-11-08 DIAGNOSIS — Z7901 Long term (current) use of anticoagulants: Secondary | ICD-10-CM | POA: Insufficient documentation

## 2023-11-08 MED ORDER — OXYCODONE-ACETAMINOPHEN 5-325 MG PO TABS
2.0000 | ORAL_TABLET | Freq: Once | ORAL | Status: AC
  Administered 2023-11-08: 2 via ORAL
  Filled 2023-11-08: qty 2

## 2023-11-08 MED ORDER — LIDOCAINE 5 % EX PTCH
1.0000 | MEDICATED_PATCH | CUTANEOUS | Status: DC
  Administered 2023-11-08: 1 via TRANSDERMAL
  Filled 2023-11-08: qty 1

## 2023-11-08 MED ORDER — LIDOCAINE 5 % EX PTCH: 1.0000 | MEDICATED_PATCH | CUTANEOUS | 0 refills | Status: AC

## 2023-11-08 MED ORDER — PREDNISONE 20 MG PO TABS: 40.0000 mg | ORAL_TABLET | Freq: Every day | ORAL | 0 refills | Status: DC

## 2023-11-08 NOTE — ED Notes (Signed)
 Patient reports he was unaware that he was ready for discharge.  Reminded of discharge papers and discharge teaching completed again to check understanding.  Pt currently calling for ride.

## 2023-11-08 NOTE — ED Triage Notes (Signed)
 Seen on 10/16 for same - BIB EMS from home for chronic left shoulder pain radiating to head.

## 2023-11-08 NOTE — Discharge Instructions (Signed)
 As we discussed, I would like for you to follow-up with orthopedics for further evaluation.  You can return to the emergency department for any worsening symptoms.

## 2023-11-08 NOTE — ED Notes (Signed)
 Patient reports he has a ride on the way for discharge.

## 2023-11-08 NOTE — ED Provider Notes (Signed)
 [place] EMERGENCY DEPARTMENT AT [facility] Provider Note   CSN: [id number] Arrival date & time: 11/08/23  1415     Patient presents with: Shoulder Pain and Headache   Steve Brown is a 83 y.o. male patient with history of chronic glenohumeral osteoarthritis and rotator cuff arthropathy who presents to the emergency department today for exacerbation of chronic left shoulder pain.  This is a known issue to the patient.  He just had a injection done 9 days ago.  He states that his orthopedic surgeon would like to do a shoulder replacement but patient lives alone and does not want to do that.  Patient was able to get to his appointment today and secondary to pain came to the emergency department.  He denies any new problems.    Shoulder Pain Headache      Prior to Admission medications   Medication Sig Start Date End Date Taking? Authorizing Provider  lidocaine  (LIDODERM ) 5 % Place 1 patch onto the skin daily. Remove & Discard patch within 12 hours or as directed by MD 11/08/23  Yes Theotis, Zella Dewan M, PA-C  predniSONE  (DELTASONE ) 20 MG tablet Take 2 tablets (40 mg total) by mouth daily. 11/08/23  Yes Enrika Aguado M, PA-C  apixaban  (ELIQUIS ) 5 MG TABS tablet Take 5 mg by mouth 2 (two) times daily. 08/31/23   [provider]  busPIRone  (BUSPAR ) 5 MG tablet Take 5 mg by mouth 3 (three) times daily.    [provider]  cetirizine  (ZYRTEC ) 5 MG tablet Take 2 tablets (10 mg total) by mouth daily. Patient taking differently: Take 5 mg by mouth as needed for allergies. 10/23/22   Beatty, Celeste A, PA-C  Cholecalciferol  (VITAMIN D -1000 MAX ST) 25 MCG (1000 UT) tablet Take 1,000 Units by mouth daily.    [provider]  diclofenac  Sodium (VOLTAREN ) 1 % GEL Apply 4 g topically 4 (four) times daily. 09/03/19   Floyd, Dan, DO  donepezil (ARICEPT) 10 MG tablet Take 10 mg by mouth daily. 11/17/22   [provider]  escitalopram  (LEXAPRO ) 20 MG  tablet Take 20 mg by mouth in the morning.    [provider]  finasteride  (PROSCAR ) 5 MG tablet Take 5 mg by mouth daily.    [provider]  furosemide  (LASIX ) 20 MG tablet Take 1-2 tablets (20-40 mg total) by mouth daily as needed for fluid. 06/13/20   Medina-Vargas, Monina C, NP  HYDROcodone -acetaminophen  (NORCO/VICODIN) 5-325 MG tablet Take 1 tablet by mouth daily. Patient taking differently: Take 1 tablet by mouth every 4 (four) hours as needed for moderate pain (pain score 4-6). 07/24/23     hydrOXYzine  (VISTARIL ) 25 MG capsule Take 25 mg by mouth daily.    [provider]  insulin  aspart (NOVOLOG ) 100 UNIT/ML FlexPen Inject 5 Units into the skin 3 (three) times daily with meals. Patient taking differently: Inject 5 Units into the skin 3 (three) times daily with meals. Plus a sliding scale depending on B/G levels 06/13/20   Medina-Vargas, Monina C, NP  ipratropium (ATROVENT) 0.06 % nasal spray Place 2 sprays into both nostrils as needed for rhinitis.    [provider]  losartan  (COZAAR ) 50 MG tablet Take 50 mg by mouth daily.    [provider]  meclizine  (ANTIVERT ) 25 MG tablet Take 1 tablet (25 mg total) by mouth 2 (two) times daily as needed for dizziness. 08/09/22   Jerral Meth, MD  methocarbamol  (ROBAXIN ) 500 MG tablet Take 1-2 tablets (500-1,000  mg total) by mouth every 8 (eight) hours as needed for muscle spasms. 10/26/23   Cardama, Raynell Moder, MD  nystatin cream (MYCOSTATIN) Apply 1 Application topically as needed for rash. 06/15/21   [provider]  pregabalin  (LYRICA ) 300 MG capsule Take 300 mg by mouth 2 (two) times daily. 04/26/20   [provider]  tamsulosin  (FLOMAX ) 0.4 MG CAPS capsule Take 1 capsule (0.4 mg total) by mouth daily after supper. 09/30/23 09/29/24  Von Bellis, MD  TRESIBA  FLEXTOUCH 100 UNIT/ML FlexTouch Pen Inject 34 Units into the skin daily. Patient taking differently: Inject 30 Units into the skin  daily. 06/13/20   Medina-Vargas, Monina C, NP    Allergies: Other, Phenergan  [promethazine ], Phenergan  [promethazine ], Topiramate, and Tramadol     Review of Systems  Neurological:  Positive for headaches.  All other systems reviewed and are negative.   Updated Vital Signs BP 126/69 (BP Location: Right Arm)   Pulse 63   Temp 98 F (36.7 C) (Oral)   Resp 16   SpO2 100%   Physical Exam Vitals and nursing note reviewed.  Constitutional:      Appearance: Normal appearance.  HENT:     Head: Normocephalic and atraumatic.  Eyes:     General:        Right eye: No discharge.        Left eye: No discharge.     Conjunctiva/sclera: Conjunctivae normal.  Pulmonary:     Effort: Pulmonary effort is normal.  Musculoskeletal:     Comments: There is tenderness to palpation to the left trapezius muscle.  With palpation and with having the patient rotate the neck to the right this does reproduce the patient's headache.  Skin:    General: Skin is warm and dry.     Findings: No rash.  Neurological:     General: No focal deficit present.     Mental Status: He is alert.  Psychiatric:        Mood and Affect: Mood normal.        Behavior: Behavior normal.     (all labs ordered are listed, but only abnormal results are displayed) Labs Reviewed - No data to display  EKG: None  Radiology: No results found.   Procedures   Medications Ordered in the ED  lidocaine  (LIDODERM ) 5 % 1 patch (1 patch Transdermal Patch Applied 11/08/23 1536)  oxyCODONE -acetaminophen  (PERCOCET/ROXICET) 5-325 MG per tablet 2 tablet (2 tablets Oral Given 11/08/23 1536)     Medical Decision Making Steve Brown is a 83 y.o. male patient who presents to the emergency department today for further evaluation of worsening chronic left shoulder pain.  Seems like the patient has had discussions with orthopedics for left shoulder replacement but the patient is overall hesitated.  Given that there is nothing new about  this problem and this seems to be all chronic pain I will give him some pain control and plan to discharge home.  Patient agreeable with plan.  Will plan to give him Percocet and lidocaine  patches.  If that works I will prescribe lidocaine  patches.  Do not feel that prescribing narcotics today would be of benefit for the patient.  On repeat evaluation, patient was feeling better.  He was complaining of some left lower back pain with radiation down the left leg.  Seems consistent with sciatica.  Low suspicion for cauda equina or epidural abscess or any other emergent causes in the lower spine.  Will give him some prednisone  for the next 3  days and lidocaine  patches to go home with.  He will follow-up with orthopedics.  Patient agreeable with plan.  Strict return precautions were discussed.  He is safe for discharge.   Risk Prescription drug management.     Final diagnoses:  Chronic left shoulder pain    ED Discharge Orders          Ordered    lidocaine  (LIDODERM ) 5 %  Every 24 hours        11/08/23 1621    predniSONE  (DELTASONE ) 20 MG tablet  Daily        11/08/23 [address] [place], [place] 11/08/23 1622    Garrick Charleston, MD 11/09/23 [phone]

## 2023-11-09 DIAGNOSIS — N183 Chronic kidney disease, stage 3 unspecified: Secondary | ICD-10-CM | POA: Diagnosis not present

## 2023-11-09 DIAGNOSIS — E782 Mixed hyperlipidemia: Secondary | ICD-10-CM | POA: Diagnosis not present

## 2023-11-09 DIAGNOSIS — I10 Essential (primary) hypertension: Secondary | ICD-10-CM | POA: Diagnosis not present

## 2023-11-09 DIAGNOSIS — E1140 Type 2 diabetes mellitus with diabetic neuropathy, unspecified: Secondary | ICD-10-CM | POA: Diagnosis not present

## 2023-11-10 ENCOUNTER — Encounter (HOSPITAL_COMMUNITY): Payer: Self-pay | Admitting: *Deleted

## 2023-11-10 ENCOUNTER — Other Ambulatory Visit: Payer: Self-pay

## 2023-11-10 ENCOUNTER — Emergency Department (HOSPITAL_COMMUNITY)
Admission: EM | Admit: 2023-11-10 | Discharge: 2023-11-10 | Disposition: A | Discharge status: Home / Self Care | Attending: Emergency Medicine | Admitting: Emergency Medicine

## 2023-11-10 DIAGNOSIS — H538 Other visual disturbances: Secondary | ICD-10-CM | POA: Diagnosis not present

## 2023-11-10 DIAGNOSIS — Z7901 Long term (current) use of anticoagulants: Secondary | ICD-10-CM | POA: Insufficient documentation

## 2023-11-10 DIAGNOSIS — R519 Headache, unspecified: Secondary | ICD-10-CM | POA: Insufficient documentation

## 2023-11-10 DIAGNOSIS — G4489 Other headache syndrome: Secondary | ICD-10-CM | POA: Diagnosis not present

## 2023-11-10 DIAGNOSIS — R42 Dizziness and giddiness: Secondary | ICD-10-CM | POA: Diagnosis not present

## 2023-11-10 LAB — CBG MONITORING, ED: Glucose-Capillary: 335 mg/dL — ABNORMAL HIGH (ref 70–99)

## 2023-11-10 LAB — COMPREHENSIVE METABOLIC PANEL WITH GFR
ALT: 17 U/L (ref 0–44)
AST: 21 U/L (ref 15–41)
Albumin: 3.1 g/dL — ABNORMAL LOW (ref 3.5–5.0)
Alkaline Phosphatase: 60 U/L (ref 38–126)
Anion gap: 10 (ref 5–15)
BUN: 27 mg/dL — ABNORMAL HIGH (ref 8–23)
CO2: 24 mmol/L (ref 22–32)
Calcium: 8.4 mg/dL — ABNORMAL LOW (ref 8.9–10.3)
Chloride: 101 mmol/L (ref 98–111)
Creatinine, Ser: 1.76 mg/dL — ABNORMAL HIGH (ref 0.61–1.24)
GFR, Estimated: 38 mL/min — ABNORMAL LOW (ref >60)
Glucose, Bld: 336 mg/dL — ABNORMAL HIGH (ref 70–99)
Potassium: 4.3 mmol/L (ref 3.5–5.1)
Sodium: 135 mmol/L (ref 135–145)
Total Bilirubin: 1.1 mg/dL (ref 0.0–1.2)
Total Protein: 6.5 g/dL (ref 6.5–8.1)

## 2023-11-10 LAB — CBC
HCT: 36 % — ABNORMAL LOW (ref 39.0–52.0)
Hemoglobin: 12 g/dL — ABNORMAL LOW (ref 13.0–17.0)
MCH: 31.5 pg (ref 26.0–34.0)
MCHC: 33.3 g/dL (ref 30.0–36.0)
MCV: 94.5 fL (ref 80.0–100.0)
Platelets: 153 K/uL (ref 150–400)
RBC: 3.81 MIL/uL — ABNORMAL LOW (ref 4.22–5.81)
RDW: 12.9 % (ref 11.5–15.5)
WBC: 4.2 K/uL (ref 4.0–10.5)
nRBC: 0 % (ref 0.0–0.2)

## 2023-11-10 MED ORDER — HYDROCODONE-ACETAMINOPHEN 5-325 MG PO TABS: 1.0000 | ORAL_TABLET | Freq: Four times a day (QID) | ORAL | 0 refills | Status: DC | PRN

## 2023-11-10 MED ORDER — OXYCODONE-ACETAMINOPHEN 5-325 MG PO TABS
1.0000 | ORAL_TABLET | Freq: Once | ORAL | Status: AC
  Administered 2023-11-10: 1 via ORAL
  Filled 2023-11-10: qty 1

## 2023-11-10 MED ORDER — KETOROLAC TROMETHAMINE 15 MG/ML IJ SOLN
15.0000 mg | Freq: Once | INTRAMUSCULAR | Status: AC
  Administered 2023-11-10: 15 mg via INTRAVENOUS
  Filled 2023-11-10: qty 1

## 2023-11-10 MED ORDER — LIDOCAINE 5 % EX PTCH
1.0000 | MEDICATED_PATCH | CUTANEOUS | Status: DC
  Administered 2023-11-10: 1 via TRANSDERMAL
  Filled 2023-11-10: qty 1

## 2023-11-10 NOTE — ED Notes (Signed)
Pt ambulated to wheelchair without difficulty. 

## 2023-11-10 NOTE — ED Triage Notes (Addendum)
 PT here via [facility] to be re-evaluated for dizziness continued L shoulder pain.   Pt states hx of chronic L shoulder pain that has increased recently.  He was seen at [facility] last night for dizziness and L shoulder pain and was discharged with lido patch and oxy.    Pt also c/o dizziness since waking up yesterday am at 11 as well as R sided pulsating head pain that he has been experiencing for a year.  Stroke screen negative.  Pt is due for his fentanyl  patch for chronic pain, but has been unable to pick up from pharmacy.

## 2023-11-10 NOTE — ED Provider Triage Note (Signed)
 Emergency Medicine Provider Triage Evaluation Note  Aurelius Gildersleeve , a 83 y.o. male  was evaluated in triage.  Pt complains of dizziness pain and neck pain and left shoulder pain and back.  Review of Systems  Positive: Shoulder pain Negative: Chest pain  Physical Exam  BP (!) 141/77 (BP Location: Left Arm)   Pulse 64   Temp 100 F (37.8 C) (Oral)   Resp 16   Ht 5' 5 (1.651 m)   Wt 83.9 kg   SpO2 97%   BMI 30.78 kg/m  Gen:   Awake, no distress   Resp:  Normal effort  MSK:   Diffuse left shoulder pain Other:    Medical Decision Making  Medically screening exam initiated at 5:09 PM.  Appropriate orders placed.  Ryzen Deady was informed that the remainder of the evaluation will be completed by another provider, this initial triage assessment does not replace that evaluation, and the importance of remaining in the ED until their evaluation is complete.     Towana Ozell BROCKS, MD 11/10/23 1710

## 2023-11-10 NOTE — Discharge Instructions (Signed)
 As we discussed, your recurrent headache can be managed by your neurologist, either at the [facility] or with Dr. Chalice in [place]. Call them on Monday to schedule an appointment for further outpatient management.

## 2023-11-10 NOTE — ED Provider Notes (Signed)
 [place] EMERGENCY DEPARTMENT AT [facility] Provider Note   CSN: [id number] Arrival date & time: 11/10/23  1640     Patient presents with: Dizziness   Steve Brown is a 83 y.o. male.   Patient to ED complains of headache on the right side of his head, into the neck, that he reports he has had intermittently for at least the last year. He also reports left shoulder pain which is chronic. He denies any new symptoms today. He reports he has been prescribed lidocaine  patches but has not been to pick them up at the pharmacy. No new weakness, no fever, no nausea, vomiting.  The history is provided by the patient. No language interpreter was used.  Dizziness      Prior to Admission medications   Medication Sig Start Date End Date Taking? Authorizing Provider  HYDROcodone -acetaminophen  (NORCO/VICODIN) 5-325 MG tablet Take 1 tablet by mouth every 6 (six) hours as needed for severe pain (pain score 7-10). 11/10/23  Yes Vannia Pola, Margit, PA-C  apixaban  (ELIQUIS ) 5 MG TABS tablet Take 5 mg by mouth 2 (two) times daily. 08/31/23   [provider]  busPIRone  (BUSPAR ) 5 MG tablet Take 5 mg by mouth 3 (three) times daily.    [provider]  cetirizine  (ZYRTEC ) 5 MG tablet Take 2 tablets (10 mg total) by mouth daily. Patient taking differently: Take 5 mg by mouth as needed for allergies. 10/23/22   Beatty, Celeste A, PA-C  Cholecalciferol  (VITAMIN D -1000 MAX ST) 25 MCG (1000 UT) tablet Take 1,000 Units by mouth daily.    [provider]  diclofenac  Sodium (VOLTAREN ) 1 % GEL Apply 4 g topically 4 (four) times daily. 09/03/19   Floyd, Dan, DO  donepezil (ARICEPT) 10 MG tablet Take 10 mg by mouth daily. 11/17/22   [provider]  escitalopram  (LEXAPRO ) 20 MG tablet Take 20 mg by mouth in the morning.    [provider]  finasteride  (PROSCAR ) 5 MG tablet Take 5 mg by mouth daily.    [provider]  furosemide  (LASIX ) 20 MG tablet Take  1-2 tablets (20-40 mg total) by mouth daily as needed for fluid. 06/13/20   Medina-Vargas, Monina C, NP  hydrOXYzine  (VISTARIL ) 25 MG capsule Take 25 mg by mouth daily.    [provider]  insulin  aspart (NOVOLOG ) 100 UNIT/ML FlexPen Inject 5 Units into the skin 3 (three) times daily with meals. Patient taking differently: Inject 5 Units into the skin 3 (three) times daily with meals. Plus a sliding scale depending on B/G levels 06/13/20   Medina-Vargas, Monina C, NP  ipratropium (ATROVENT) 0.06 % nasal spray Place 2 sprays into both nostrils as needed for rhinitis.    [provider]  lidocaine  (LIDODERM ) 5 % Place 1 patch onto the skin daily. Remove & Discard patch within 12 hours or as directed by MD 11/08/23   Theotis Cameron HERO, PA-C  losartan  (COZAAR ) 50 MG tablet Take 50 mg by mouth daily.    [provider]  meclizine  (ANTIVERT ) 25 MG tablet Take 1 tablet (25 mg total) by mouth 2 (two) times daily as needed for dizziness. 08/09/22   Jerral Meth, MD  methocarbamol  (ROBAXIN ) 500 MG tablet Take 1-2 tablets (500-1,000 mg total) by mouth every 8 (eight) hours as needed for muscle spasms. 10/26/23   Cardama, Raynell Moder, MD  nystatin cream (MYCOSTATIN) Apply 1 Application topically as needed for rash. 06/15/21   [provider]  predniSONE  (DELTASONE ) 20 MG tablet  Take 2 tablets (40 mg total) by mouth daily. 11/08/23   Theotis Peers M, PA-C  pregabalin  (LYRICA ) 300 MG capsule Take 300 mg by mouth 2 (two) times daily. 04/26/20   [provider]  tamsulosin  (FLOMAX ) 0.4 MG CAPS capsule Take 1 capsule (0.4 mg total) by mouth daily after supper. 09/30/23 09/29/24  Von Bellis, MD  TRESIBA  FLEXTOUCH 100 UNIT/ML FlexTouch Pen Inject 34 Units into the skin daily. Patient taking differently: Inject 30 Units into the skin daily. 06/13/20   Medina-Vargas, Monina C, NP    Allergies: Other, Phenergan  [promethazine ], Phenergan  [promethazine ], Topiramate, and Tramadol      Review of Systems  Neurological:  Positive for dizziness.    Updated Vital Signs BP (!) 145/77   Pulse (!) 53   Temp 100 F (37.8 C) (Oral)   Resp 12   Ht 5' 5 (1.651 m)   Wt 83.9 kg   SpO2 92%   BMI 30.78 kg/m   Physical Exam Constitutional:      Appearance: He is well-developed.  HENT:     Head: Normocephalic.  Cardiovascular:     Rate and Rhythm: Normal rate and regular rhythm.     Heart sounds: No murmur heard. Pulmonary:     Effort: Pulmonary effort is normal.     Breath sounds: Normal breath sounds. No wheezing, rhonchi or rales.  Abdominal:     General: Bowel sounds are normal.     Palpations: Abdomen is soft.     Tenderness: There is no abdominal tenderness. There is no guarding or rebound.  Musculoskeletal:        General: Normal range of motion.     Cervical back: Normal range of motion and neck supple.  Skin:    General: Skin is warm and dry.  Neurological:     General: No focal deficit present.     Mental Status: He is alert and oriented to person, place, and time.     GCS: GCS eye subscore is 4. GCS verbal subscore is 5. GCS motor subscore is 6.     Cranial Nerves: No dysarthria or facial asymmetry.     Sensory: Sensation is intact.     Motor: No weakness or pronator drift.     Coordination: Finger-Nose-Finger Test normal.     Deep Tendon Reflexes:     Reflex Scores:      Patellar reflexes are 2+ on the right side and 2+ on the left side.    (all labs ordered are listed, but only abnormal results are displayed) Labs Reviewed  COMPREHENSIVE METABOLIC PANEL WITH GFR - Abnormal; Notable for the following components:      Result Value   Glucose, Bld 336 (*)    BUN 27 (*)    Creatinine, Ser 1.76 (*)    Calcium  8.4 (*)    Albumin 3.1 (*)    GFR, Estimated 38 (*)    All other components within normal limits  CBC - Abnormal; Notable for the following components:   RBC 3.81 (*)    Hemoglobin 12.0 (*)    HCT 36.0 (*)    All other components  within normal limits  CBG MONITORING, ED - Abnormal; Notable for the following components:   Glucose-Capillary 335 (*)    All other components within normal limits  URINALYSIS, ROUTINE W REFLEX MICROSCOPIC    EKG: None  Radiology: No results found.   Procedures   Medications Ordered in the ED  lidocaine  (LIDODERM ) 5 % 1 patch (1  patch Transdermal Patch Applied 11/10/23 1829)  ketorolac  (TORADOL ) 15 MG/ML injection 15 mg (15 mg Intravenous Given 11/10/23 1827)  oxyCODONE -acetaminophen  (PERCOCET/ROXICET) 5-325 MG per tablet 1 tablet (1 tablet Oral Given 11/10/23 1827)    Clinical Course as of 11/10/23 2335  Sat Nov 10, 2023  1734 CTA head/neck 10/17: IMPRESSION: 1. No acute intracranial abnormality. 2. Negative CTA for acute large vessel occlusion or other emergent findings. 3. 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 Retrograde filling of the distal left v4 segment across the vertebrobasilar junction with perfusion of the left pica. 4. Atheromatous change about the proximal left common carotid artery with up to 60% stenosis by NASCET criteria. 5. Moderate atheromatous change about the carotid siphons with associated mild to moderate stenoses about the supraclinoid segments bilaterally. 6. Aortic atherosclerosis.  [SU]  1931 Patient to ED with right sided headache, neck pain, since prior stroke. No new symptoms. Also endorses chronic left shoulder pain.   He is overall well appearing. Do not suspect new neurologic event. Recent CTA as noted without acute change. Medications were provided in the ED including lidocaine  patch and oxycodone . On re-evaluation the patient states he feels much better. He has been seen by Dr. Randol.   He is felt appropriate for discharge home.  [SU]    Clinical Course User Index [SU] Odell Balls, PA-C                                 Medical Decision Making Amount and/or Complexity of Data Reviewed Labs:  ordered.  Risk Prescription drug management.        Final diagnoses:  Nonintractable episodic headache, unspecified headache type    ED Discharge Orders          Ordered    HYDROcodone -acetaminophen  (NORCO/VICODIN) 5-325 MG tablet  Every 6 hours PRN        11/10/23 1922               Odell Balls, PA-C 11/10/23 2335    Randol Simmonds, MD 11/11/23 1506

## 2023-11-14 ENCOUNTER — Ambulatory Visit: Admitting: Podiatry

## 2023-11-18 ENCOUNTER — Other Ambulatory Visit: Payer: Self-pay

## 2023-11-18 ENCOUNTER — Emergency Department (HOSPITAL_COMMUNITY)
Admission: EM | Admit: 2023-11-18 | Discharge: 2023-11-18 | Disposition: A | Discharge status: Home / Self Care | Attending: Emergency Medicine | Admitting: Emergency Medicine

## 2023-11-18 ENCOUNTER — Encounter (HOSPITAL_COMMUNITY): Payer: Self-pay | Admitting: *Deleted

## 2023-11-18 DIAGNOSIS — R079 Chest pain, unspecified: Secondary | ICD-10-CM | POA: Diagnosis not present

## 2023-11-18 DIAGNOSIS — Z794 Long term (current) use of insulin: Secondary | ICD-10-CM | POA: Insufficient documentation

## 2023-11-18 DIAGNOSIS — R531 Weakness: Secondary | ICD-10-CM | POA: Diagnosis not present

## 2023-11-18 DIAGNOSIS — I2510 Atherosclerotic heart disease of native coronary artery without angina pectoris: Secondary | ICD-10-CM | POA: Diagnosis not present

## 2023-11-18 DIAGNOSIS — R42 Dizziness and giddiness: Secondary | ICD-10-CM | POA: Insufficient documentation

## 2023-11-18 DIAGNOSIS — N189 Chronic kidney disease, unspecified: Secondary | ICD-10-CM | POA: Insufficient documentation

## 2023-11-18 DIAGNOSIS — E1122 Type 2 diabetes mellitus with diabetic chronic kidney disease: Secondary | ICD-10-CM | POA: Insufficient documentation

## 2023-11-18 DIAGNOSIS — Z7901 Long term (current) use of anticoagulants: Secondary | ICD-10-CM | POA: Diagnosis not present

## 2023-11-18 DIAGNOSIS — I129 Hypertensive chronic kidney disease with stage 1 through stage 4 chronic kidney disease, or unspecified chronic kidney disease: Secondary | ICD-10-CM | POA: Insufficient documentation

## 2023-11-18 DIAGNOSIS — Z79899 Other long term (current) drug therapy: Secondary | ICD-10-CM | POA: Insufficient documentation

## 2023-11-18 DIAGNOSIS — H538 Other visual disturbances: Secondary | ICD-10-CM | POA: Diagnosis not present

## 2023-11-18 LAB — COMPREHENSIVE METABOLIC PANEL WITH GFR
ALT: 18 U/L (ref 0–44)
AST: 25 U/L (ref 15–41)
Albumin: 3.3 g/dL — ABNORMAL LOW (ref 3.5–5.0)
Alkaline Phosphatase: 69 U/L (ref 38–126)
Anion gap: 15 (ref 5–15)
BUN: 29 mg/dL — ABNORMAL HIGH (ref 8–23)
CO2: 23 mmol/L (ref 22–32)
Calcium: 9.2 mg/dL (ref 8.9–10.3)
Chloride: 99 mmol/L (ref 98–111)
Creatinine, Ser: 1.76 mg/dL — ABNORMAL HIGH (ref 0.61–1.24)
GFR, Estimated: 38 mL/min — ABNORMAL LOW (ref >60)
Glucose, Bld: 353 mg/dL — ABNORMAL HIGH (ref 70–99)
Potassium: 4.6 mmol/L (ref 3.5–5.1)
Sodium: 137 mmol/L (ref 135–145)
Total Bilirubin: 1.6 mg/dL — ABNORMAL HIGH (ref 0.0–1.2)
Total Protein: 7.2 g/dL (ref 6.5–8.1)

## 2023-11-18 LAB — CBC
HCT: 41.6 % (ref 39.0–52.0)
Hemoglobin: 13.8 g/dL (ref 13.0–17.0)
MCH: 31.4 pg (ref 26.0–34.0)
MCHC: 33.2 g/dL (ref 30.0–36.0)
MCV: 94.5 fL (ref 80.0–100.0)
Platelets: 178 K/uL (ref 150–400)
RBC: 4.4 MIL/uL (ref 4.22–5.81)
RDW: 12.8 % (ref 11.5–15.5)
WBC: 5.7 K/uL (ref 4.0–10.5)
nRBC: 0.3 % — ABNORMAL HIGH (ref 0.0–0.2)

## 2023-11-18 LAB — CBG MONITORING, ED: Glucose-Capillary: 310 mg/dL — ABNORMAL HIGH (ref 70–99)

## 2023-11-18 MED ORDER — ACETAMINOPHEN 500 MG PO TABS
1000.0000 mg | ORAL_TABLET | Freq: Once | ORAL | Status: AC
  Administered 2023-11-18: 1000 mg via ORAL
  Filled 2023-11-18: qty 2

## 2023-11-18 MED ORDER — ONDANSETRON 4 MG PO TBDP
4.0000 mg | ORAL_TABLET | Freq: Once | ORAL | Status: AC
  Administered 2023-11-18: 4 mg via ORAL
  Filled 2023-11-18: qty 1

## 2023-11-18 MED ORDER — ONDANSETRON HCL 4 MG/2ML IJ SOLN: 4.0000 mg | Freq: Once | INTRAMUSCULAR | Status: DC

## 2023-11-18 NOTE — ED Notes (Signed)
..  Patient is A&Ox4 upon discharge. Patient verbalized understanding of discharge instructions and follow-up care. AVS documentation sent home with daughter as well.

## 2023-11-18 NOTE — ED Notes (Signed)
 Daughter called for pick up and stated that she is on her way.

## 2023-11-18 NOTE — ED Provider Notes (Signed)
  EMERGENCY DEPARTMENT AT [facility] Provider Note   CSN: [id number] Arrival date & time: 11/18/23  1409     Patient presents with: Dizziness   Steve Brown is a 83 y.o. male with PMHx DM, CAD, TIA, HTN, CKD who presents to ED concern for lightheadedness.  Patient stating that this has been going on for 2-3 months and he has had multiple ED workups for same symptom.  Patient denies worsening of symptoms recently. Lightheadedness occurs with standing up. Denies vertigo. Denies head trauma.  Denies LOC or seizures. Denies fever, chest pain, dyspnea, cough, vomiting, diarrhea, dysuria, hematuria.  Of note, patient also with chronic right shoulder pain and takes muscle relaxers and narcotics for pain. Patient also stating that he has not eaten any food today and is requesting something to eat.  Patient stating that he had not left his house in a couple of days - neighbor came to check on patient and patient continued to have his chronic right shoulder pain and lightheadedness so neighbor convinced him to come back to ED.     Dizziness      Prior to Admission medications   Medication Sig Start Date End Date Taking? Authorizing Provider  apixaban  (ELIQUIS ) 5 MG TABS tablet Take 5 mg by mouth 2 (two) times daily. 08/31/23   [provider]  busPIRone  (BUSPAR ) 5 MG tablet Take 5 mg by mouth 3 (three) times daily.    [provider]  cetirizine  (ZYRTEC ) 5 MG tablet Take 2 tablets (10 mg total) by mouth daily. Patient taking differently: Take 5 mg by mouth as needed for allergies. 10/23/22   Beatty, Celeste A, PA-C  Cholecalciferol  (VITAMIN D -1000 MAX ST) 25 MCG (1000 UT) tablet Take 1,000 Units by mouth daily.    [provider]  diclofenac  Sodium (VOLTAREN ) 1 % GEL Apply 4 g topically 4 (four) times daily. 09/03/19   Floyd, Dan, DO  donepezil (ARICEPT) 10 MG tablet Take 10 mg by mouth daily. 11/17/22   [provider]  escitalopram   (LEXAPRO ) 20 MG tablet Take 20 mg by mouth in the morning.    [provider]  finasteride  (PROSCAR ) 5 MG tablet Take 5 mg by mouth daily.    [provider]  furosemide  (LASIX ) 20 MG tablet Take 1-2 tablets (20-40 mg total) by mouth daily as needed for fluid. 06/13/20   Medina-Vargas, Monina C, NP  HYDROcodone -acetaminophen  (NORCO/VICODIN) 5-325 MG tablet Take 1 tablet by mouth every 6 (six) hours as needed for severe pain (pain score 7-10). 11/10/23   Odell Balls, PA-C  hydrOXYzine  (VISTARIL ) 25 MG capsule Take 25 mg by mouth daily.    [provider]  insulin  aspart (NOVOLOG ) 100 UNIT/ML FlexPen Inject 5 Units into the skin 3 (three) times daily with meals. Patient taking differently: Inject 5 Units into the skin 3 (three) times daily with meals. Plus a sliding scale depending on B/G levels 06/13/20   Medina-Vargas, Monina C, NP  ipratropium (ATROVENT) 0.06 % nasal spray Place 2 sprays into both nostrils as needed for rhinitis.    [provider]  lidocaine  (LIDODERM ) 5 % Place 1 patch onto the skin daily. Remove & Discard patch within 12 hours or as directed by MD 11/08/23   Theotis Cameron HERO, PA-C  losartan  (COZAAR ) 50 MG tablet Take 50 mg by mouth daily.    [provider]  meclizine  (ANTIVERT ) 25 MG tablet Take 1 tablet (25 mg total) by mouth 2 (two) times daily as  needed for dizziness. 08/09/22   Jerral Meth, MD  methocarbamol  (ROBAXIN ) 500 MG tablet Take 1-2 tablets (500-1,000 mg total) by mouth every 8 (eight) hours as needed for muscle spasms. 10/26/23   Cardama, Raynell Moder, MD  nystatin cream (MYCOSTATIN) Apply 1 Application topically as needed for rash. 06/15/21   [provider]  predniSONE  (DELTASONE ) 20 MG tablet Take 2 tablets (40 mg total) by mouth daily. 11/08/23   Theotis Peers M, PA-C  pregabalin  (LYRICA ) 300 MG capsule Take 300 mg by mouth 2 (two) times daily. 04/26/20   [provider]  tamsulosin  (FLOMAX ) 0.4  MG CAPS capsule Take 1 capsule (0.4 mg total) by mouth daily after supper. 09/30/23 09/29/24  Von Bellis, MD  TRESIBA  FLEXTOUCH 100 UNIT/ML FlexTouch Pen Inject 34 Units into the skin daily. Patient taking differently: Inject 30 Units into the skin daily. 06/13/20   Medina-Vargas, Monina C, NP    Allergies: Other, Phenergan  [promethazine ], Phenergan  [promethazine ], Topiramate, and Tramadol     Review of Systems  Neurological:  Positive for dizziness.    Updated Vital Signs BP (!) 170/90   Pulse 66   Temp 98.9 F (37.2 C) (Oral)   Resp 16   Ht 5' 5 (1.651 m)   Wt 83.9 kg   SpO2 96%   BMI 30.78 kg/m   Physical Exam Vitals and nursing note reviewed.  Constitutional:      General: He is not in acute distress.    Appearance: He is not ill-appearing or toxic-appearing.  HENT:     Head: Normocephalic and atraumatic.     Mouth/Throat:     Mouth: Mucous membranes are moist.  Eyes:     General: No scleral icterus.       Right eye: No discharge.        Left eye: No discharge.     Conjunctiva/sclera: Conjunctivae normal.  Cardiovascular:     Rate and Rhythm: Normal rate and regular rhythm.     Pulses: Normal pulses.     Heart sounds: Normal heart sounds. No murmur heard. Pulmonary:     Effort: Pulmonary effort is normal. No respiratory distress.     Breath sounds: Normal breath sounds. No wheezing, rhonchi or rales.  Abdominal:     General: Abdomen is flat. Bowel sounds are normal. There is no distension.     Palpations: Abdomen is soft. There is no mass.     Tenderness: There is no abdominal tenderness.  Musculoskeletal:     Right lower leg: No edema.     Left lower leg: No edema.  Skin:    General: Skin is warm and dry.     Findings: No rash.  Neurological:     General: No focal deficit present.     Mental Status: He is alert and oriented to person, place, and time. Mental status is at baseline.     Comments: GCS 15. Speech is goal oriented. No deficits appreciated to CN  III-XII; symmetric eyebrow raise, no facial drooping, tongue midline. Patient has equal grip strength bilaterally with 5/5 strength against resistance in all major muscle groups bilaterally. Sensation to light touch intact. Patient moves extremities without ataxia.  Psychiatric:        Mood and Affect: Mood normal.        Behavior: Behavior normal.     (all labs ordered are listed, but only abnormal results are displayed) Labs Reviewed  COMPREHENSIVE METABOLIC PANEL WITH GFR - Abnormal; Notable for the following components:  Result Value   Glucose, Bld 353 (*)    BUN 29 (*)    Creatinine, Ser 1.76 (*)    Albumin 3.3 (*)    Total Bilirubin 1.6 (*)    GFR, Estimated 38 (*)    All other components within normal limits  CBC - Abnormal; Notable for the following components:   nRBC 0.3 (*)    All other components within normal limits  CBG MONITORING, ED - Abnormal; Notable for the following components:   Glucose-Capillary 310 (*)    All other components within normal limits    EKG: None  Radiology: No results found.   Procedures   Medications Ordered in the ED  acetaminophen  (TYLENOL ) tablet 1,000 mg (1,000 mg Oral Given 11/18/23 1718)  ondansetron  (ZOFRAN -ODT) disintegrating tablet 4 mg (4 mg Oral Given 11/18/23 1718)                                    Medical Decision Making Amount and/or Complexity of Data Reviewed Labs: ordered.  Risk OTC drugs. Prescription drug management.   his patient presents to the ED for concern of dizziness, this involves an extensive number of treatment options, and is a complaint that carries with it a high risk of complications and morbidity.  The differential diagnosis includes CVA, ICH, intracranial mass, critical dehydration, heptatic dysfunction, uremia, hypercarbia, intoxication/withdrawal, endocrine abnormality, sepsis/infection, vestibular neuritis, peripheral vertigo.   Co morbidities that complicate the patient  evaluation  DM, CAD, TIA, HTN, CKD   Additional history obtained:  10/2023 CTA head neck: No acute intracranial abnormality.  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  Retrograde filling of distal left V4 segment across the vertebrobasilar junction with perfusion of the left PICA.  Atheromatous change about the proximal left common carotid artery with up to 60% stenosis.  Moderate atheromatous change about the carotid siphons with associated mild to moderate stenosis about the supraclinoid segments bilaterally. 09/2023 MRI: No acute intracranial abnormality.   Problem List / ED Course / Critical interventions / Medication management  Patient presented for lightheadedness.  This is a chronic intermittent symptom that has been occurring over the past 2 to 3 months.  Occurs with standing/walking.  Patient denies worsening of the symptoms recently.  Patient with reassuring CTA head neck and MRI since symptoms started.  Physical/neuroexam reassuring.  Patient afebrile with stable vitals.  Orthostatic vital signs reassuring. I Ordered, and personally interpreted labs.  CBC with hyperglycemia at 310.  CBC without leukocytosis or anemia.  CMP with chronically elevated BUN/creatinine near baseline at 29/1.76 today. The patient was maintained on a cardiac monitor.  I personally viewed and interpreted the cardiac monitored which showed an underlying rhythm of: Sinus rhythm. Patient tolerating PO.  Shared all results with patient.  Answered all questions.  Overall, I am more concerned that patient's intermittent lightheadedness is from medication side effects as he is on muscle relaxers and Norco for his chronic left shoulder pain.  Educated patient that he will need to follow-up with PCP to review his medicines.  Patient agreeable to plan. Staffed with Dr. Francesca. I have reviewed the patients home medicines and have made adjustments as needed The patient has been appropriately medically  screened and/or stabilized in the ED. I have low suspicion for any other emergent medical condition which would require further screening, evaluation or treatment in the ED or require inpatient management. At time of discharge  the patient is hemodynamically stable and in no acute distress. I have discussed work-up results and diagnosis with patient and answered all questions. Patient is agreeable with discharge plan. We discussed strict return precautions for returning to the emergency department and they verbalized understanding.     Social Determinants of Health:  geriatric      Final diagnoses:  Lightheadedness    ED Discharge Orders     None          Hoy Nidia FALCON, [place] 11/18/23 1838    Francesca Elsie CROME, MD 11/21/23 1534

## 2023-11-18 NOTE — ED Triage Notes (Addendum)
 Pt was tx at [facility] and here last weekend for worsening dizziness, L shoulder pain and headache (all chronic).  He was discharged with a lidocaine  patch.  Daughter was concerned because pt did not receive any imaging for his dizziness.  Pt is back today, via [facility], for increasing dizziness and L shoulder pain x 2 days.    Bp 167/93 Hr 68 99% RA Cbg 432 Rr 24

## 2023-11-18 NOTE — Discharge Instructions (Addendum)
 As discussed, please follow-up with primary care.  Seek emergency care if experiencing any new or worsening symptoms.  Alternating between 650 mg Tylenol  and 400 mg Advil : The best way to alternate taking Acetaminophen  (example Tylenol ) and Ibuprofen  (example Advil /Motrin ) is to take them 3 hours apart. For example, if you take ibuprofen  at 6 am you can then take Tylenol  at 9 am. You can continue this regimen throughout the day, making sure you do not exceed the recommended maximum dose for each drug.

## 2023-11-20 ENCOUNTER — Telehealth: Payer: Self-pay

## 2023-11-20 DIAGNOSIS — E1140 Type 2 diabetes mellitus with diabetic neuropathy, unspecified: Secondary | ICD-10-CM

## 2023-11-21 ENCOUNTER — Emergency Department (HOSPITAL_COMMUNITY)

## 2023-11-21 ENCOUNTER — Encounter (HOSPITAL_COMMUNITY): Payer: Self-pay | Admitting: Emergency Medicine

## 2023-11-21 ENCOUNTER — Inpatient Hospital Stay (HOSPITAL_COMMUNITY)
Admission: EM | Admit: 2023-11-21 | Discharge: 2023-11-23 | DRG: 153 | Disposition: A | Discharge status: Home Health | Attending: Internal Medicine | Admitting: Internal Medicine

## 2023-11-21 ENCOUNTER — Other Ambulatory Visit: Payer: Self-pay

## 2023-11-21 ENCOUNTER — Telehealth: Payer: Self-pay

## 2023-11-21 ENCOUNTER — Observation Stay (HOSPITAL_COMMUNITY)

## 2023-11-21 DIAGNOSIS — I69351 Hemiplegia and hemiparesis following cerebral infarction affecting right dominant side: Secondary | ICD-10-CM | POA: Diagnosis not present

## 2023-11-21 DIAGNOSIS — R519 Headache, unspecified: Secondary | ICD-10-CM | POA: Diagnosis not present

## 2023-11-21 DIAGNOSIS — Z8546 Personal history of malignant neoplasm of prostate: Secondary | ICD-10-CM

## 2023-11-21 DIAGNOSIS — N400 Enlarged prostate without lower urinary tract symptoms: Secondary | ICD-10-CM | POA: Diagnosis present

## 2023-11-21 DIAGNOSIS — E1122 Type 2 diabetes mellitus with diabetic chronic kidney disease: Secondary | ICD-10-CM | POA: Diagnosis present

## 2023-11-21 DIAGNOSIS — I82412 Acute embolism and thrombosis of left femoral vein: Secondary | ICD-10-CM | POA: Diagnosis present

## 2023-11-21 DIAGNOSIS — N1831 Chronic kidney disease, stage 3a: Secondary | ICD-10-CM | POA: Diagnosis present

## 2023-11-21 DIAGNOSIS — M7582 Other shoulder lesions, left shoulder: Secondary | ICD-10-CM | POA: Diagnosis not present

## 2023-11-21 DIAGNOSIS — E1165 Type 2 diabetes mellitus with hyperglycemia: Secondary | ICD-10-CM | POA: Diagnosis present

## 2023-11-21 DIAGNOSIS — Z7901 Long term (current) use of anticoagulants: Secondary | ICD-10-CM

## 2023-11-21 DIAGNOSIS — R0989 Other specified symptoms and signs involving the circulatory and respiratory systems: Secondary | ICD-10-CM | POA: Diagnosis not present

## 2023-11-21 DIAGNOSIS — Z820 Family history of epilepsy and other diseases of the nervous system: Secondary | ICD-10-CM

## 2023-11-21 DIAGNOSIS — E785 Hyperlipidemia, unspecified: Secondary | ICD-10-CM | POA: Diagnosis present

## 2023-11-21 DIAGNOSIS — F0390 Unspecified dementia without behavioral disturbance: Secondary | ICD-10-CM | POA: Diagnosis present

## 2023-11-21 DIAGNOSIS — E872 Acidosis, unspecified: Secondary | ICD-10-CM | POA: Diagnosis present

## 2023-11-21 DIAGNOSIS — J329 Chronic sinusitis, unspecified: Secondary | ICD-10-CM | POA: Diagnosis present

## 2023-11-21 DIAGNOSIS — G609 Hereditary and idiopathic neuropathy, unspecified: Secondary | ICD-10-CM | POA: Diagnosis present

## 2023-11-21 DIAGNOSIS — R739 Hyperglycemia, unspecified: Secondary | ICD-10-CM | POA: Diagnosis present

## 2023-11-21 DIAGNOSIS — Z885 Allergy status to narcotic agent status: Secondary | ICD-10-CM

## 2023-11-21 DIAGNOSIS — G8929 Other chronic pain: Secondary | ICD-10-CM | POA: Diagnosis present

## 2023-11-21 DIAGNOSIS — I5032 Chronic diastolic (congestive) heart failure: Secondary | ICD-10-CM | POA: Diagnosis present

## 2023-11-21 DIAGNOSIS — M25512 Pain in left shoulder: Secondary | ICD-10-CM | POA: Diagnosis not present

## 2023-11-21 DIAGNOSIS — Z91041 Radiographic dye allergy status: Secondary | ICD-10-CM

## 2023-11-21 DIAGNOSIS — Z888 Allergy status to other drugs, medicaments and biological substances status: Secondary | ICD-10-CM

## 2023-11-21 DIAGNOSIS — R079 Chest pain, unspecified: Secondary | ICD-10-CM | POA: Diagnosis not present

## 2023-11-21 DIAGNOSIS — I69354 Hemiplegia and hemiparesis following cerebral infarction affecting left non-dominant side: Secondary | ICD-10-CM

## 2023-11-21 DIAGNOSIS — M19012 Primary osteoarthritis, left shoulder: Secondary | ICD-10-CM | POA: Diagnosis not present

## 2023-11-21 DIAGNOSIS — R609 Edema, unspecified: Secondary | ICD-10-CM | POA: Diagnosis not present

## 2023-11-21 DIAGNOSIS — M549 Dorsalgia, unspecified: Secondary | ICD-10-CM | POA: Diagnosis present

## 2023-11-21 DIAGNOSIS — E860 Dehydration: Secondary | ICD-10-CM | POA: Diagnosis present

## 2023-11-21 DIAGNOSIS — I130 Hypertensive heart and chronic kidney disease with heart failure and stage 1 through stage 4 chronic kidney disease, or unspecified chronic kidney disease: Secondary | ICD-10-CM | POA: Diagnosis present

## 2023-11-21 DIAGNOSIS — M85812 Other specified disorders of bone density and structure, left shoulder: Secondary | ICD-10-CM | POA: Diagnosis not present

## 2023-11-21 DIAGNOSIS — R7989 Other specified abnormal findings of blood chemistry: Secondary | ICD-10-CM

## 2023-11-21 DIAGNOSIS — G4733 Obstructive sleep apnea (adult) (pediatric): Secondary | ICD-10-CM | POA: Diagnosis present

## 2023-11-21 DIAGNOSIS — R0789 Other chest pain: Secondary | ICD-10-CM | POA: Diagnosis not present

## 2023-11-21 DIAGNOSIS — I361 Nonrheumatic tricuspid (valve) insufficiency: Secondary | ICD-10-CM | POA: Diagnosis not present

## 2023-11-21 DIAGNOSIS — I2510 Atherosclerotic heart disease of native coronary artery without angina pectoris: Secondary | ICD-10-CM | POA: Diagnosis present

## 2023-11-21 DIAGNOSIS — Z981 Arthrodesis status: Secondary | ICD-10-CM | POA: Diagnosis not present

## 2023-11-21 DIAGNOSIS — N179 Acute kidney failure, unspecified: Secondary | ICD-10-CM | POA: Diagnosis present

## 2023-11-21 DIAGNOSIS — I672 Cerebral atherosclerosis: Secondary | ICD-10-CM | POA: Diagnosis not present

## 2023-11-21 DIAGNOSIS — Z809 Family history of malignant neoplasm, unspecified: Secondary | ICD-10-CM

## 2023-11-21 DIAGNOSIS — I959 Hypotension, unspecified: Secondary | ICD-10-CM | POA: Diagnosis present

## 2023-11-21 DIAGNOSIS — I700 Atherosclerosis of aorta: Secondary | ICD-10-CM | POA: Diagnosis not present

## 2023-11-21 DIAGNOSIS — I6782 Cerebral ischemia: Secondary | ICD-10-CM | POA: Diagnosis not present

## 2023-11-21 DIAGNOSIS — K219 Gastro-esophageal reflux disease without esophagitis: Secondary | ICD-10-CM | POA: Diagnosis present

## 2023-11-21 DIAGNOSIS — I517 Cardiomegaly: Secondary | ICD-10-CM | POA: Diagnosis not present

## 2023-11-21 DIAGNOSIS — M79603 Pain in arm, unspecified: Secondary | ICD-10-CM | POA: Diagnosis not present

## 2023-11-21 DIAGNOSIS — Z91148 Patient's other noncompliance with medication regimen for other reason: Secondary | ICD-10-CM

## 2023-11-21 DIAGNOSIS — E869 Volume depletion, unspecified: Secondary | ICD-10-CM | POA: Diagnosis present

## 2023-11-21 DIAGNOSIS — R531 Weakness: Secondary | ICD-10-CM | POA: Diagnosis not present

## 2023-11-21 DIAGNOSIS — Z823 Family history of stroke: Secondary | ICD-10-CM

## 2023-11-21 DIAGNOSIS — Z794 Long term (current) use of insulin: Secondary | ICD-10-CM | POA: Diagnosis not present

## 2023-11-21 DIAGNOSIS — G4489 Other headache syndrome: Secondary | ICD-10-CM | POA: Diagnosis not present

## 2023-11-21 DIAGNOSIS — Z86711 Personal history of pulmonary embolism: Secondary | ICD-10-CM

## 2023-11-21 DIAGNOSIS — Z79899 Other long term (current) drug therapy: Secondary | ICD-10-CM

## 2023-11-21 DIAGNOSIS — H903 Sensorineural hearing loss, bilateral: Secondary | ICD-10-CM | POA: Diagnosis present

## 2023-11-21 DIAGNOSIS — I10 Essential (primary) hypertension: Secondary | ICD-10-CM | POA: Diagnosis not present

## 2023-11-21 LAB — BASIC METABOLIC PANEL WITH GFR
Anion gap: 16 — ABNORMAL HIGH (ref 5–15)
BUN: 39 mg/dL — ABNORMAL HIGH (ref 8–23)
CO2: 24 mmol/L (ref 22–32)
Calcium: 8.6 mg/dL — ABNORMAL LOW (ref 8.9–10.3)
Chloride: 92 mmol/L — ABNORMAL LOW (ref 98–111)
Creatinine, Ser: 2.05 mg/dL — ABNORMAL HIGH (ref 0.61–1.24)
GFR, Estimated: 32 mL/min — ABNORMAL LOW (ref >60)
Glucose, Bld: 564 mg/dL (ref 70–99)
Potassium: 4.8 mmol/L (ref 3.5–5.1)
Sodium: 132 mmol/L — ABNORMAL LOW (ref 135–145)

## 2023-11-21 LAB — CBC WITH DIFFERENTIAL/PLATELET
Abs Immature Granulocytes: 0.03 K/uL (ref 0.00–0.07)
Basophils Absolute: 0 K/uL (ref 0.0–0.1)
Basophils Relative: 0 %
Eosinophils Absolute: 0.1 K/uL (ref 0.0–0.5)
Eosinophils Relative: 2 %
HCT: 36.8 % — ABNORMAL LOW (ref 39.0–52.0)
Hemoglobin: 12.6 g/dL — ABNORMAL LOW (ref 13.0–17.0)
Immature Granulocytes: 1 %
Lymphocytes Relative: 19 %
Lymphs Abs: 1.2 K/uL (ref 0.7–4.0)
MCH: 31.5 pg (ref 26.0–34.0)
MCHC: 34.2 g/dL (ref 30.0–36.0)
MCV: 92 fL (ref 80.0–100.0)
Monocytes Absolute: 0.6 K/uL (ref 0.1–1.0)
Monocytes Relative: 9 %
Neutro Abs: 4.3 K/uL (ref 1.7–7.7)
Neutrophils Relative %: 69 %
Platelets: 148 K/uL — ABNORMAL LOW (ref 150–400)
RBC: 4 MIL/uL — ABNORMAL LOW (ref 4.22–5.81)
RDW: 12.5 % (ref 11.5–15.5)
WBC: 6.2 K/uL (ref 4.0–10.5)
nRBC: 0 % (ref 0.0–0.2)

## 2023-11-21 LAB — I-STAT CG4 LACTIC ACID, ED
Lactic Acid, Venous: 2.1 mmol/L (ref 0.5–1.9)
Lactic Acid, Venous: 3.3 mmol/L (ref 0.5–1.9)

## 2023-11-21 LAB — TROPONIN I (HIGH SENSITIVITY)
Troponin I (High Sensitivity): 375 ng/L (ref <18)
Troponin I (High Sensitivity): 365 ng/L (ref <18)

## 2023-11-21 LAB — MAGNESIUM: Magnesium: 2 mg/dL (ref 1.7–2.4)

## 2023-11-21 LAB — CBG MONITORING, ED
Glucose-Capillary: 389 mg/dL — ABNORMAL HIGH (ref 70–99)
Glucose-Capillary: 340 mg/dL — ABNORMAL HIGH (ref 70–99)

## 2023-11-21 LAB — BETA-HYDROXYBUTYRIC ACID: Beta-Hydroxybutyric Acid: 3.09 mmol/L — ABNORMAL HIGH (ref 0.05–0.27)

## 2023-11-21 LAB — LACTIC ACID, PLASMA: Lactic Acid, Venous: 1.8 mmol/L (ref 0.5–1.9)

## 2023-11-21 LAB — C-REACTIVE PROTEIN: CRP: 0.7 mg/dL (ref <1.0)

## 2023-11-21 LAB — GLUCOSE, CAPILLARY: Glucose-Capillary: 239 mg/dL — ABNORMAL HIGH (ref 70–99)

## 2023-11-21 LAB — SEDIMENTATION RATE: Sed Rate: 3 mm/h (ref 0–16)

## 2023-11-21 LAB — BRAIN NATRIURETIC PEPTIDE: B Natriuretic Peptide: 541.3 pg/mL — ABNORMAL HIGH (ref 0.0–100.0)

## 2023-11-21 MED ORDER — SODIUM CHLORIDE 0.9% FLUSH
3.0000 mL | Freq: Two times a day (BID) | INTRAVENOUS | Status: DC
  Administered 2023-11-21 – 2023-11-23 (×4): 3 mL via INTRAVENOUS

## 2023-11-21 MED ORDER — BUSPIRONE HCL 10 MG PO TABS
5.0000 mg | ORAL_TABLET | Freq: Three times a day (TID) | ORAL | Status: DC
  Administered 2023-11-21 – 2023-11-22 (×2): 5 mg via ORAL
  Filled 2023-11-21 (×2): qty 1

## 2023-11-21 MED ORDER — FINASTERIDE 5 MG PO TABS
5.0000 mg | ORAL_TABLET | Freq: Every day | ORAL | Status: DC
  Administered 2023-11-22 – 2023-11-23 (×2): 5 mg via ORAL
  Filled 2023-11-21 (×2): qty 1

## 2023-11-21 MED ORDER — SODIUM CHLORIDE 0.9 % IV SOLN: INTRAVENOUS | Status: DC

## 2023-11-21 MED ORDER — ESCITALOPRAM OXALATE 10 MG PO TABS
20.0000 mg | ORAL_TABLET | Freq: Every morning | ORAL | Status: DC
  Administered 2023-11-22 – 2023-11-23 (×2): 20 mg via ORAL
  Filled 2023-11-21 (×2): qty 2

## 2023-11-21 MED ORDER — DONEPEZIL HCL 10 MG PO TABS
10.0000 mg | ORAL_TABLET | Freq: Every day | ORAL | Status: DC
  Administered 2023-11-21 – 2023-11-22 (×2): 10 mg via ORAL
  Filled 2023-11-21 (×2): qty 1

## 2023-11-21 MED ORDER — MORPHINE SULFATE (PF) 2 MG/ML IV SOLN: 2.0000 mg | INTRAVENOUS | Status: DC | PRN

## 2023-11-21 MED ORDER — APIXABAN 5 MG PO TABS
5.0000 mg | ORAL_TABLET | Freq: Two times a day (BID) | ORAL | Status: DC
  Administered 2023-11-21 – 2023-11-23 (×4): 5 mg via ORAL
  Filled 2023-11-21 (×4): qty 1

## 2023-11-21 MED ORDER — TAMSULOSIN HCL 0.4 MG PO CAPS
0.4000 mg | ORAL_CAPSULE | Freq: Every day | ORAL | Status: DC
  Administered 2023-11-21 – 2023-11-23 (×3): 0.4 mg via ORAL
  Filled 2023-11-21 (×3): qty 1

## 2023-11-21 MED ORDER — ASPIRIN 81 MG PO CHEW
324.0000 mg | CHEWABLE_TABLET | Freq: Once | ORAL | Status: AC
  Administered 2023-11-21: 324 mg via ORAL
  Filled 2023-11-21: qty 4

## 2023-11-21 MED ORDER — INSULIN ASPART 100 UNIT/ML IJ SOLN
0.0000 [IU] | INTRAMUSCULAR | Status: DC
  Administered 2023-11-21: 11 [IU] via SUBCUTANEOUS
  Administered 2023-11-22: 8 [IU] via SUBCUTANEOUS
  Administered 2023-11-22: 3 [IU] via SUBCUTANEOUS
  Administered 2023-11-22: 2 [IU] via SUBCUTANEOUS
  Administered 2023-11-22: 5 [IU] via SUBCUTANEOUS
  Administered 2023-11-22: 3 [IU] via SUBCUTANEOUS
  Administered 2023-11-22 – 2023-11-23 (×3): 5 [IU] via SUBCUTANEOUS
  Administered 2023-11-23: 2 [IU] via SUBCUTANEOUS
  Administered 2023-11-23: 11 [IU] via SUBCUTANEOUS
  Filled 2023-11-21 (×2): qty 5
  Filled 2023-11-21: qty 4
  Filled 2023-11-21: qty 1
  Filled 2023-11-21: qty 2
  Filled 2023-11-21: qty 8
  Filled 2023-11-21: qty 11
  Filled 2023-11-21: qty 5
  Filled 2023-11-21: qty 3
  Filled 2023-11-21: qty 11

## 2023-11-21 MED ORDER — ACETAMINOPHEN 650 MG RE SUPP: 650.0000 mg | Freq: Four times a day (QID) | RECTAL | Status: DC | PRN

## 2023-11-21 MED ORDER — INSULIN ASPART 100 UNIT/ML IJ SOLN
10.0000 [IU] | Freq: Once | INTRAMUSCULAR | Status: AC
  Administered 2023-11-21: 10 [IU] via INTRAVENOUS
  Filled 2023-11-21: qty 10

## 2023-11-21 MED ORDER — ACETAMINOPHEN 325 MG PO TABS
650.0000 mg | ORAL_TABLET | Freq: Four times a day (QID) | ORAL | Status: DC | PRN
  Administered 2023-11-22: 650 mg via ORAL
  Filled 2023-11-21: qty 2

## 2023-11-21 MED ORDER — HYDRALAZINE HCL 20 MG/ML IJ SOLN: 5.0000 mg | Freq: Four times a day (QID) | INTRAMUSCULAR | Status: DC | PRN

## 2023-11-21 MED ORDER — SODIUM CHLORIDE 0.9 % IV SOLN
1.5000 g | Freq: Two times a day (BID) | INTRAVENOUS | Status: DC
  Administered 2023-11-21 – 2023-11-22 (×2): 1.5 g via INTRAVENOUS
  Filled 2023-11-21 (×4): qty 4

## 2023-11-21 MED ORDER — SODIUM CHLORIDE 0.9 % IV BOLUS
500.0000 mL | Freq: Once | INTRAVENOUS | Status: AC
  Administered 2023-11-21: 500 mL via INTRAVENOUS

## 2023-11-21 MED ORDER — HYDROCODONE-ACETAMINOPHEN 5-325 MG PO TABS
1.0000 | ORAL_TABLET | ORAL | Status: DC | PRN
  Administered 2023-11-21 – 2023-11-23 (×3): 1 via ORAL
  Filled 2023-11-21 (×3): qty 1

## 2023-11-21 NOTE — ED Provider Notes (Addendum)
 [place] EMERGENCY DEPARTMENT AT [facility] Provider Note   CSN: [id number] Arrival date & time: 11/21/23  1444     Patient presents with: Headache and Hyperglycemia   Steve Brown is a 83 y.o. male.   Patient seen not infrequently for both these complaints.  Complaint today is right sided headache that started last night left arm pain shoulder all the way down to the hand for a few days.  Patient lives by himself.  Patient apparently followed by orthopedics that wanted to do left shoulder replacement patient lives by himself so he has not wanted to do that.  Patient is on Eliquis  patient is on Aricept.  Patient has a history of TIAs prior MRI does show evidence of old strokes.  Patient recently has had CT head CT angio of the head as well as MRI brain.  Patient also noted to have stage II chronic kidney disease coronary artery disease hypertension hyperlipidemia type 2 diabetes from the coronary disease standpoint stenting of the LAD in 2010.  Patient is never used tobacco products.  Patient seen November 9 for dizziness lightheadedness seen November 1 for headache seen October 30 for shoulder pain and headache.  Seen October 16 for left arm pain.  Also it appears that patient is supposed to be on Eliquis  as well.  Patient followed by [facility] for primary care provider, chart review implies that he is followed by [facility].       Prior to Admission medications   Medication Sig Start Date End Date Taking? Authorizing Provider  apixaban  (ELIQUIS ) 5 MG TABS tablet Take 5 mg by mouth 2 (two) times daily. 08/31/23   [provider]  busPIRone  (BUSPAR ) 5 MG tablet Take 5 mg by mouth 3 (three) times daily.    [provider]  cetirizine  (ZYRTEC ) 5 MG tablet Take 2 tablets (10 mg total) by mouth daily. Patient taking differently: Take 5 mg by mouth as needed for allergies. 10/23/22   Suellen Cantor A, PA-C  Cholecalciferol  (VITAMIN D -1000 MAX  ST) 25 MCG (1000 UT) tablet Take 1,000 Units by mouth daily.    [provider]  diclofenac  Sodium (VOLTAREN ) 1 % GEL Apply 4 g topically 4 (four) times daily. 09/03/19   Floyd, Dan, DO  donepezil (ARICEPT) 10 MG tablet Take 10 mg by mouth daily. 11/17/22   [provider]  escitalopram  (LEXAPRO ) 20 MG tablet Take 20 mg by mouth in the morning.    [provider]  finasteride  (PROSCAR ) 5 MG tablet Take 5 mg by mouth daily.    [provider]  furosemide  (LASIX ) 20 MG tablet Take 1-2 tablets (20-40 mg total) by mouth daily as needed for fluid. 06/13/20   Medina-Vargas, Monina C, NP  HYDROcodone -acetaminophen  (NORCO/VICODIN) 5-325 MG tablet Take 1 tablet by mouth every 6 (six) hours as needed for severe pain (pain score 7-10). 11/10/23   Odell Balls, PA-C  hydrOXYzine  (VISTARIL ) 25 MG capsule Take 25 mg by mouth daily.    [provider]  insulin  aspart (NOVOLOG ) 100 UNIT/ML FlexPen Inject 5 Units into the skin 3 (three) times daily with meals. Patient taking differently: Inject 5 Units into the skin 3 (three) times daily with meals. Plus a sliding scale depending on B/G levels 06/13/20   Medina-Vargas, Monina C, NP  ipratropium (ATROVENT) 0.06 % nasal spray Place 2 sprays into both nostrils as needed for rhinitis.    [provider]  lidocaine  (LIDODERM ) 5 % Place 1 patch onto  the skin daily. Remove & Discard patch within 12 hours or as directed by MD 11/08/23   Theotis Cameron HERO, PA-C  losartan  (COZAAR ) 50 MG tablet Take 50 mg by mouth daily.    [provider]  meclizine  (ANTIVERT ) 25 MG tablet Take 1 tablet (25 mg total) by mouth 2 (two) times daily as needed for dizziness. 08/09/22   Jerral Meth, MD  methocarbamol  (ROBAXIN ) 500 MG tablet Take 1-2 tablets (500-1,000 mg total) by mouth every 8 (eight) hours as needed for muscle spasms. 10/26/23   Cardama, Raynell Moder, MD  nystatin cream (MYCOSTATIN) Apply 1 Application topically as  needed for rash. 06/15/21   [provider]  predniSONE  (DELTASONE ) 20 MG tablet Take 2 tablets (40 mg total) by mouth daily. 11/08/23   Theotis Cameron M, PA-C  pregabalin  (LYRICA ) 300 MG capsule Take 300 mg by mouth 2 (two) times daily. 04/26/20   [provider]  tamsulosin  (FLOMAX ) 0.4 MG CAPS capsule Take 1 capsule (0.4 mg total) by mouth daily after supper. 09/30/23 09/29/24  Von Bellis, MD  TRESIBA  FLEXTOUCH 100 UNIT/ML FlexTouch Pen Inject 34 Units into the skin daily. Patient taking differently: Inject 30 Units into the skin daily. 06/13/20   Medina-Vargas, Monina C, NP    Allergies: Other, Phenergan  [promethazine ], Phenergan  [promethazine ], Topiramate, and Tramadol     Review of Systems  Constitutional:  Negative for chills and fever.  HENT:  Negative for ear pain and sore throat.   Eyes:  Negative for pain and visual disturbance.  Respiratory:  Negative for cough and shortness of breath.   Cardiovascular:  Positive for chest pain. Negative for palpitations.  Gastrointestinal:  Negative for abdominal pain and vomiting.  Genitourinary:  Negative for dysuria and hematuria.  Musculoskeletal:  Negative for arthralgias, back pain, neck pain and neck stiffness.  Skin:  Negative for color change and rash.  Neurological:  Positive for headaches. Negative for seizures and syncope.  Hematological:  Bruises/bleeds easily.  All other systems reviewed and are negative.   Updated Vital Signs BP 139/80 (BP Location: Right Arm)   Pulse 68   Temp 98.3 F (36.8 C) (Oral)   Resp 16   SpO2 100%   Physical Exam Vitals and nursing note reviewed.  Constitutional:      General: He is not in acute distress.    Appearance: Normal appearance. He is well-developed.  HENT:     Head: Normocephalic and atraumatic.  Eyes:     Conjunctiva/sclera: Conjunctivae normal.     Pupils: Pupils are equal, round, and reactive to light.  Cardiovascular:     Rate and Rhythm: Normal rate and  regular rhythm.     Heart sounds: No murmur heard. Pulmonary:     Effort: Pulmonary effort is normal. No respiratory distress.     Breath sounds: Normal breath sounds. No wheezing, rhonchi or rales.  Chest:     Chest wall: No tenderness.  Abdominal:     Palpations: Abdomen is soft.     Tenderness: There is no abdominal tenderness. There is no guarding.  Musculoskeletal:        General: No swelling.     Cervical back: Neck supple.     Right lower leg: No edema.     Left lower leg: No edema.  Skin:    General: Skin is warm and dry.     Capillary Refill: Capillary refill takes less than 2 seconds.  Neurological:     General: No focal deficit present.  Mental Status: He is alert. Mental status is at baseline.     Cranial Nerves: No cranial nerve deficit.     Sensory: No sensory deficit.     Motor: No weakness.  Psychiatric:        Mood and Affect: Mood normal.     (all labs ordered are listed, but only abnormal results are displayed) Labs Reviewed  CBC WITH DIFFERENTIAL/PLATELET - Abnormal; Notable for the following components:      Result Value   RBC 4.00 (*)    Hemoglobin 12.6 (*)    HCT 36.8 (*)    Platelets 148 (*)    All other components within normal limits  BASIC METABOLIC PANEL WITH GFR - Abnormal; Notable for the following components:   Sodium 132 (*)    Chloride 92 (*)    Glucose, Bld 564 (*)    BUN 39 (*)    Creatinine, Ser 2.05 (*)    Calcium  8.6 (*)    GFR, Estimated 32 (*)    Anion gap 16 (*)    All other components within normal limits  TROPONIN I (HIGH SENSITIVITY) - Abnormal; Notable for the following components:   Troponin I (High Sensitivity) 375 (*)    All other components within normal limits  TROPONIN I (HIGH SENSITIVITY)    EKG: EKG Interpretation Date/Time:  Wednesday November 21 2023 16:25:55 EST Ventricular Rate:  67 PR Interval:  129 QRS Duration:  93 QT Interval:  417 QTC Calculation: 441 R Axis:   -7  Text  Interpretation: Sinus rhythm RSR' in V1 or V2, right VCD or RVH No significant change since last tracing Confirmed by Enrico Eaddy [phone]) on 11/21/2023 4:36:21 PM  Radiology: DG Chest 1 View Result Date: 11/21/2023 EXAM: 1 VIEW(S) XRAY OF THE CHEST 11/21/2023 04:13:00 PM COMPARISON: 02/05/2023 CLINICAL HISTORY: Chest pain FINDINGS: LUNGS AND PLEURA: Low lung volumes with bronchovascular crowding. No focal airspace consolidation. No pleural effusion. No pneumothorax. HEART AND MEDIASTINUM: Mild cardiomegaly. Aortic atherosclerosis. BONES AND SOFT TISSUES: Lower cervical fusion hardware. Cholecystectomy clips. Multilevel thoracic osteophytosis. No acute osseous abnormality. IMPRESSION: 1. Lower lung volumes. No acute cardiopulmonary abnormality. Electronically signed by: Rogelia Myers MD 11/21/2023 05:08 PM EST RP Workstation: [id number]   DG Shoulder Left Result Date: 11/21/2023 EXAM: 1 VIEW XRAY OF THE LEFT SHOULDER 11/21/2023 04:13:00 PM COMPARISON: None available. CLINICAL HISTORY: Chest pain FINDINGS: BONES AND JOINTS: Osteopenia. Moderate glenohumeral joint osteoarthritis. No acute fracture or dislocation. Mild osteoarthritis of the acromioclavicular joint. SOFT TISSUES: Visualized lung is unremarkable. Aortic atherosclerosis. IMPRESSION: 1. No acute fracture or dislocation. 2. Glenohumeral and AC joint osteoarthritis. Electronically signed by: Rogelia Myers MD 11/21/2023 05:06 PM EST RP Workstation: [id number]   CT Head Wo Contrast Result Date: 11/21/2023 EXAM: CT HEAD WITHOUT CONTRAST 11/21/2023 03:44:14 PM TECHNIQUE: CT of the head was performed without the administration of intravenous contrast. Automated exposure control, iterative reconstruction, and/or weight based adjustment of the mA/kV was utilized to reduce the radiation dose to as low as reasonably achievable. COMPARISON: 09/28/2023 CLINICAL HISTORY: Headache, increasing frequency or severity FINDINGS: BRAIN AND VENTRICLES: No  acute hemorrhage. No evidence of acute infarct. Chronic left cerebellar infarction. Similar appearance of chronic microvascular ischemic changes. Mild age related volume loss. No hydrocephalus. No extra-axial collection. No mass effect or midline shift. Atherosclerotic calcifications within the cavernous internal carotid and vertebral arteries. ORBITS: Bilateral lens replacement. SINUSES: Chronic right sphenoid sinusitis. Additional mucosal thickening in the ethmoid sinuses, left greater than right. SOFT TISSUES AND SKULL: Left  frontal scalp lipoma. No skull fracture. IMPRESSION: 1. No acute intracranial abnormality. 2. Chronic left cerebellar infarction. 3. Chronic microvascular ischemic changes with mild age-related volume loss. 4. Chronic right sphenoid sinusitis with additional mucosal thickening in the ethmoid sinuses, left greater than right. Electronically signed by: Donnice Mania MD 11/21/2023 04:45 PM EST RP Workstation: [id number]     Procedures   Medications Ordered in the ED - No data to display                                  Medical Decision Making Amount and/or Complexity of Data Reviewed Labs: ordered. Radiology: ordered.  Risk OTC drugs. Prescription drug management. Decision regarding hospitalization.   Will rule out cardiac event based on his history of coronary artery disease.  The headache is similar to what he is complained of before on the right side.  Will go ahead and get head CT just to rule out any concerns for bleed.  Patient recently had CTA and MRI brain.  Seems that he is normally treated with hydrocodone  for both the left shoulder pain and the right sided headache.  Left shoulder pain he is followed by orthopedics.  They want to do shoulder replacement patient wants not to have that done at this time.  Patient does seem to be on Eliquis  seems to have a history of some dementia could be some confusion about the recurrent symptoms and not memory of them in  the past.  Patient CBC white count 6.2 hemoglobin 12.6 platelets 148.  Basic metabolic panel sodium 132 glucose 564 so significantly elevated.  Will probably need to give a little bit of fluids maybe a little bit of insulin  patient's CO2 though good at 24.  Creatinine 2.05 for GFR of 32 patient's initial troponin is 375.  That will lead to admission.  But denies any chest pain.  X-ray of the shoulder no acute fracture or dislocation glenohumeral or AC joint osteoarthritis is known in the past.  Chest x-ray nothing acute other than low lung volumes.  And CT head no acute intercranial abnormality chronic left cerebellar infarction chronic microvascular ischemic changes and chronic right sphenoid sinusitis.  Patient is already on Eliquis .  Will give some aspirin  as well.  And will contact hospitalist for admission.  Will give some IV fluids and some IV insulin  for the hyperglycemia.  Discussed with cardiology.  They will be available for consultation depending on how the troponins go.  They do not feel they need to see him tonight.  Would recommend hospitalist team to give them a call in the morning if they feel formal consultation is required.  Final diagnoses:  Intractable episodic headache, unspecified headache type  Atypical chest pain  Elevated troponin  Hyperglycemia    ED Discharge Orders     None          Geraldene Hamilton, MD 11/21/23 1533    Geraldene Hamilton, MD 11/21/23 1742    Geraldene Hamilton, MD 11/21/23 1750    Geraldene Hamilton, MD 11/21/23 1751    Geraldene Hamilton, MD 11/21/23 1800

## 2023-11-21 NOTE — ED Notes (Signed)
 Date and time results received: 11/21/23 5:24 PM  (use smartphrase .now to insert current time)  Test: glucose 564, trop 375 Critical Value:   Name of Provider Notified: Dr. Zackowski  Orders Received? Or Actions Taken?: see chart

## 2023-11-21 NOTE — ED Notes (Signed)
 Patient placed on 2L O2 for shortness of breath. Per Dr. Tobie, placed until breathing returns to normal.

## 2023-11-21 NOTE — Progress Notes (Signed)
 While on the call to schedule patient He advised me that he needed to call 911 to a severe headache. I called 911 for pt on a 3 way call and he advised the disbatcher that he was also having some tingling and numbness.  Disbatched advised him to cover mouth and smile and asked him if both sides of mouth moved pt stated that they did not. Pt advised me that he was not alone and that he did not need to stay on phone with me until EMS arrived.  Jeoffrey Buffalo , RMA     [facility]  [facility], [facility] Guide  Direct Dial: [phone]  Website: [email or web address]

## 2023-11-21 NOTE — Progress Notes (Signed)
 Complex Care Management Note  Care Guide Note 11/21/2023 Name: Steve Brown MRN: [id number] DOB: 12-31-1940  Steve Brown is a 84 y.o. year old male who [facility]. I reached out to Marsa Molly by phone today to offer complex care management services.  Mr. Kost was given information about Complex Care Management services today including:   The Complex Care Management services include support from the care team which includes your Nurse Care Manager, Clinical Social Worker, or Pharmacist.  The Complex Care Management team is here to help remove barriers to the health concerns and goals most important to you. Complex Care Management services are voluntary, and the patient may decline or stop services at any time by request to their care team member.   Complex Care Management Consent Status: Patient agreed to services and verbal consent obtained.   Follow up plan:  Telephone appointment with complex care management team member scheduled for:  11/23/2023  Encounter Outcome:  Patient Scheduled  Jeoffrey Buffalo , RMA     [place]  [facility] Guide  Direct Dial: [phone]  Website: [email or web address]

## 2023-11-21 NOTE — ED Triage Notes (Signed)
 Pt arrives via EMS from home with headache and left arm pain for a few days, seen for same recently. CBG high for EMS, received 200 cc NS.

## 2023-11-21 NOTE — Hospital Course (Signed)
 Right sided headache and left shoulder pain.

## 2023-11-21 NOTE — Plan of Care (Signed)

## 2023-11-21 NOTE — H&P (Signed)
 History and Physical    Patient: Steve Brown FMW:[id number] DOB: 10-May-1940 DOA: 11/21/2023 DOS: the patient was seen and examined on 11/21/2023 . PCP: [facility]  Patient coming from: Home Chief complaint: Chief Complaint  Patient presents with   Headache   Hyperglycemia   HPI:  Steve Brown is a 83 y.o. male with past medical history  of   CVA, TIA, CAD, T2DM, chronic diastolic HF, anxiety and depression, BPH, prostate cancer s/p TURB and seeding, lumbar radiculopathy, s/p neck fusion, chronic back pain, hereditary and idiopathic neuropathy, OSA, history of PE, chronic sinusitis CKD 3A who presented to [facility] ED for evaluation of hyperglycemia and headache.  Per ED signout patient has a history of dementia and is on Aricept and is not remember calling EMS records previous episodes, patient has history of TIA and is on Eliquis .  Daughter [name] is at bedside and is concerned that in the past ED visits his medical issue has not been completely addressed.  Pt has had multiple ed visits this month after admitted on 9/19 and discharged on 9/21 for left-sided numbness left arm weakness and ruled out stroke with symptoms attributed to UTI per discharge summary.  Patient also has a history of cervical stenosis, diabetes mellitus type 2 status poorly controlled with an elevated 9.4 A1c.  ED Course:  Vital signs in the ED were notable for the following:  Vitals:   11/21/23 1645 11/21/23 1900 11/21/23 1930 11/21/23 1934  BP: (!) 140/75 132/74 122/71   Pulse: 73     Temp:    98.3 F (36.8 C)  Resp: 18 14 16 13   SpO2: 96%     TempSrc:    Oral  >>ED evaluation thus far shows: BMP shows hyperglycemia of 564 and AKI on CKD with creatinine of 2.05 and gfr of 39. Troponin 375. EKG show Sinus rhythm 67 PR 129 QTc 441. CBC shows wbc of 6.2, hb of 12.6, platelet of 148.    >>While in the ED patient received the following: Medications  sodium chloride  0.9 % bolus 500 mL (has no  administration in time range)  0.9 %  sodium chloride  infusion (has no administration in time range)  insulin  aspart (novoLOG ) injection 10 Units (has no administration in time range)  aspirin  chewable tablet 324 mg (has no administration in time range)   Review of Systems  Musculoskeletal:        Left shoulder pain  Neurological:  Positive for headaches.   Past Medical History:  Diagnosis Date   Abnormal prostate biopsy    Anticoagulant long-term use    currently xarelto    Anxiety    BPH with elevated PSA    CKD (chronic kidney disease), stage II    Complication of anesthesia    limted neck rom limited use of left arm due to cva   Coronary artery disease    CARDIOLOGIST-  DR DANN--  2010-- PCI w/ stenting midLAD   DDD (degenerative disc disease), lumbar    Degeneration of cervical intervertebral disc    Depression    Diabetes mellitus without complication (HCC)    Type 2   Dyspnea on exertion    GERD (gastroesophageal reflux disease)    Hemiparesis due to cerebral infarction    History of cerebrovascular accident (CVA) with residual deficit 2002 and 2003--  hemiparisis both sides   per MRI  anterior left frontal lobe, left para midline pons, and inferior cerebullam bilaterally infarcts   History of pulmonary embolus (PE)  06-30-2012  extensive bilaterally   History of recurrent TIAs    History of syncope    hx multiple pre-syncope and syncopal episodes due to vasovagal, orthostatic hypotension, dehydration   History of TIAs    several since 2002   Hyperlipidemia    Hypertension    Mild atherosclerosis of carotid artery, bilateral    per last duplex 11-04-2014  bilateral ICA 1--39%   Neuropathy    fingers   OSA on CPAP    uses most nights; followed by dr dohmeier--  sev. osa w/ AHI 65.9   Pneumonia    x 1   Prostate cancer (HCC) dx 2018   Renal insufficiency    S/P coronary artery stent placement 2010   stenting to mid LAD   Sensorineural hearing loss,  bilateral 04/01/2021   no hearing aids   Simple renal cyst    bilaterally   Stroke [facility])    Trigger finger of both hands 11/17/2013   Type 2 diabetes mellitus (HCC) dx 1986   last one A1c 9.2 on 04-26-2016   Unsteady gait    uses straight cane and occasional walker. Hx prior CVA/TIAs;   Vertebral artery occlusion, left    chronic   Past Surgical History:  Procedure Laterality Date   ANTERIOR CERVICAL DECOMP/DISCECTOMY FUSION  2004   C3 -- C6 limited rom   ARTERY BIOPSY Right 04/21/2022   Procedure: BIOPSY TEMPORAL ARTERY;  Surgeon: Gretta Lonni PARAS, MD;  Location: [facility] OR;  Service: Vascular;  Laterality: Right;   CARDIAC CATHETERIZATION  06-10-2010   dr dann   wide patent LAD stent, mid lesion at the origin of the septal prior to the previous stent 40-50%/  normal LVF, ef 55%   CARDIOVASCULAR STRESS TEST  10-23-2012  dr dann   normal nuclear perfusion study w/ no ischemia/  normal LV function and wall motion , ef 65%   CARPAL TUNNEL RELEASE Bilateral    CATARACT EXTRACTION W/ INTRAOCULAR LENS  IMPLANT, BILATERAL     CHOLECYSTECTOMY N/A 11/02/2015   Procedure: LAPAROSCOPIC CHOLECYSTECTOMY WITH INTRAOPERATIVE CHOLANGIOGRAM;  Surgeon: Donnice Lima, MD;  Location: [facility] OR;  Service: General;  Laterality: N/A;   COLONOSCOPY     CORONARY ANGIOPLASTY WITH STENT PLACEMENT  02/2008   stenting to mid LAD   GOLD SEED IMPLANT N/A 11/15/2016   Procedure: GOLD SEED IMPLANT TIMES THREE;  Surgeon: Cam Morene ORN, MD;  Location: [facility];  Service: Urology;  Laterality: N/A;   IR ANGIO INTRA EXTRACRAN SEL COM CAROTID INNOMINATE BILAT MOD SED  06/13/2018   IR ANGIO VERTEBRAL SEL VERTEBRAL UNI R MOD SED  06/13/2018   IR US  GUIDE VASC ACCESS RIGHT  06/13/2018   LEFT HEART CATH AND CORONARY ANGIOGRAPHY N/A 05/25/2017   Procedure: LEFT HEART CATH AND CORONARY ANGIOGRAPHY;  Surgeon: Dann Candyce RAMAN, MD;  Location: [facility] INVASIVE CV LAB;  Service: Cardiovascular;  Laterality:  N/A;   LEFT HEART CATHETERIZATION WITH CORONARY ANGIOGRAM N/A 04/03/2013   Procedure: LEFT HEART CATHETERIZATION WITH CORONARY ANGIOGRAM;  Surgeon: Candyce RAMAN Dann, MD;  Location: [facility] CATH LAB;  Service: Cardiovascular;  Laterality: N/A;  patent mLAD stent  w/ mild disease in remainder LAD and its branches;  mod. focal lesion midLCFx- FFR of lesion was negative for ischemia/  normal LVSF, ef 50%   lungs  2005   fluid pumped off lungs   NEUROPLASTY / TRANSPOSITION ULNAR NERVE AT ELBOW Right 2004   PROSTATE BIOPSY N/A 08/31/2016   Procedure: PROSTATE  BIOPSY TRANSRECTAL ULTRASONIC PROSTATE (TUBP);  Surgeon: Cam Morene ORN, MD;  Location: [facility];  Service: Urology;  Laterality: N/A;   SPACE OAR INSTILLATION N/A 11/15/2016   Procedure: SPACE OAR INSTILLATION;  Surgeon: Cam Morene ORN, MD;  Location: [facility];  Service: Urology;  Laterality: N/A;   TRANSTHORACIC ECHOCARDIOGRAM  04/27/2016   severe focal basal LVH, ef 60-65%,  grade 2 diastoilc dysfunction/  mild AR, MR, and TR/  atrial septum lipomatous hypertrophy/  PASP   UMBILICAL HERNIA REPAIR      reports that he has never smoked. He has never been exposed to tobacco smoke. He has never used smokeless tobacco. He reports that he does not currently use alcohol. He reports that he does not use drugs. Allergies  Allergen Reactions   Other Other (See Comments)    Per patient- cardiac cath dye-  woke up during procedure hysterical.   Phenergan  [Promethazine ] Other (See Comments)    Mood changes    Phenergan  [Promethazine ]     Other reaction(s): syncope   Topiramate Other (See Comments)    Causes hallucinations   Tramadol      Increases Blood Sugar    Family History  Problem Relation Age of Onset   Aneurysm Mother    Cancer Father        unknown either pancreatic or prostate   Dementia Sister    Stroke Brother    Stroke Daughter    Seizures Daughter    Multiple sclerosis  Granddaughter    Heart attack Neg Hx    Sleep apnea Neg Hx    Prior to Admission medications   Medication Sig Start Date End Date Taking? Authorizing Provider  apixaban  (ELIQUIS ) 5 MG TABS tablet Take 5 mg by mouth 2 (two) times daily. 08/31/23   [provider]  busPIRone  (BUSPAR ) 5 MG tablet Take 5 mg by mouth 3 (three) times daily.    [provider]  cetirizine  (ZYRTEC ) 5 MG tablet Take 2 tablets (10 mg total) by mouth daily. Patient taking differently: Take 5 mg by mouth as needed for allergies. 10/23/22   Suellen Cantor A, PA-C  Cholecalciferol  (VITAMIN D -1000 MAX ST) 25 MCG (1000 UT) tablet Take 1,000 Units by mouth daily.    [provider]  diclofenac  Sodium (VOLTAREN ) 1 % GEL Apply 4 g topically 4 (four) times daily. 09/03/19   Floyd, Dan, DO  donepezil (ARICEPT) 10 MG tablet Take 10 mg by mouth daily. 11/17/22   [provider]  escitalopram  (LEXAPRO ) 20 MG tablet Take 20 mg by mouth in the morning.    [provider]  finasteride  (PROSCAR ) 5 MG tablet Take 5 mg by mouth daily.    [provider]  furosemide  (LASIX ) 20 MG tablet Take 1-2 tablets (20-40 mg total) by mouth daily as needed for fluid. 06/13/20   Medina-Vargas, Monina C, NP  HYDROcodone -acetaminophen  (NORCO/VICODIN) 5-325 MG tablet Take 1 tablet by mouth every 6 (six) hours as needed for severe pain (pain score 7-10). 11/10/23   Odell Balls, PA-C  hydrOXYzine  (VISTARIL ) 25 MG capsule Take 25 mg by mouth daily.    [provider]  insulin  aspart (NOVOLOG ) 100 UNIT/ML FlexPen Inject 5 Units into the skin 3 (three) times daily with meals. Patient taking differently: Inject 5 Units into the skin 3 (three) times daily with meals. Plus a sliding scale depending on B/G levels 06/13/20   Medina-Vargas, Monina C, NP  ipratropium (ATROVENT) 0.06 % nasal spray Place  2 sprays into both nostrils as needed for rhinitis.    [provider]  lidocaine  (LIDODERM ) 5 % Place  1 patch onto the skin daily. Remove & Discard patch within 12 hours or as directed by MD 11/08/23   Theotis Cameron HERO, PA-C  losartan  (COZAAR ) 50 MG tablet Take 50 mg by mouth daily.    [provider]  meclizine  (ANTIVERT ) 25 MG tablet Take 1 tablet (25 mg total) by mouth 2 (two) times daily as needed for dizziness. 08/09/22   Jerral Meth, MD  methocarbamol  (ROBAXIN ) 500 MG tablet Take 1-2 tablets (500-1,000 mg total) by mouth every 8 (eight) hours as needed for muscle spasms. 10/26/23   Cardama, Raynell Moder, MD  nystatin cream (MYCOSTATIN) Apply 1 Application topically as needed for rash. 06/15/21   [provider]  predniSONE  (DELTASONE ) 20 MG tablet Take 2 tablets (40 mg total) by mouth daily. 11/08/23   Theotis Cameron M, PA-C  pregabalin  (LYRICA ) 300 MG capsule Take 300 mg by mouth 2 (two) times daily. 04/26/20   [provider]  tamsulosin  (FLOMAX ) 0.4 MG CAPS capsule Take 1 capsule (0.4 mg total) by mouth daily after supper. 09/30/23 09/29/24  Von Bellis, MD  TRESIBA  FLEXTOUCH 100 UNIT/ML FlexTouch Pen Inject 34 Units into the skin daily. Patient taking differently: Inject 30 Units into the skin daily. 06/13/20   Medina-Vargas, Monina C, NP                                                                                 Vitals:   11/21/23 1645 11/21/23 1900 11/21/23 1930 11/21/23 1934  BP: (!) 140/75 132/74 122/71   Pulse: 73     Resp: 18 14 16 13   Temp:    98.3 F (36.8 C)  TempSrc:    Oral  SpO2: 96%      Physical Exam Vitals reviewed.  Constitutional:      General: He is not in acute distress.    Appearance: He is not ill-appearing.  HENT:     Head: Normocephalic and atraumatic.      Mouth/Throat:     Mouth: Mucous membranes are dry.  Eyes:     Extraocular Movements: Extraocular movements intact.     Pupils: Pupils are equal, round, and reactive to light.  Cardiovascular:     Rate and Rhythm: Normal rate and regular rhythm.     Pulses:           Dorsalis pedis pulses are 2+ on the right side and 2+ on the left side.       Posterior tibial pulses are 2+ on the right side and 2+ on the left side.     Heart sounds: Normal heart sounds.  Pulmonary:     Breath sounds: Normal breath sounds.  Abdominal:     General: There is no distension.     Palpations: Abdomen is soft.     Tenderness: There is no abdominal tenderness.  Musculoskeletal:     Right lower leg: 1+ Edema present.     Left lower leg: 1+ Edema present.  Neurological:     General: No focal deficit present.     Mental Status:  He is alert and oriented to person, place, and time.     Cranial Nerves: Cranial nerves 2-12 are intact. No cranial nerve deficit, dysarthria or facial asymmetry.     Motor: No tremor.     Coordination: Finger-Nose-Finger Test normal.     Deep Tendon Reflexes:     Reflex Scores:      Bicep reflexes are 1+ on the right side and 1+ on the left side.      Patellar reflexes are 2+ on the right side and 1+ on the left side. Psychiatric:        Attention and Perception: Attention normal.        Mood and Affect: Affect is flat.        Speech: Speech normal.        Behavior: Behavior is cooperative.     Labs on Admission: I have personally reviewed following labs and imaging studies CBC: Recent Labs  Lab 11/18/23 1440 11/21/23 1625  WBC 5.7 6.2  NEUTROABS  --  4.3  HGB 13.8 12.6*  HCT 41.6 36.8*  MCV 94.5 92.0  PLT 178 148*   Basic Metabolic Panel: Recent Labs  Lab 11/18/23 1440 11/21/23 1625  NA 137 132*  K 4.6 4.8  CL 99 92*  CO2 23 24  GLUCOSE 353* 564*  BUN 29* 39*  CREATININE 1.76* 2.05*  CALCIUM  9.2 8.6*   GFR: Estimated Creatinine Clearance: 28.2 mL/min (A) (by C-G formula based on SCr of 2.05 mg/dL (H)). Liver Function Tests: Recent Labs  Lab 11/18/23 1440  AST 25  ALT 18  ALKPHOS 69  BILITOT 1.6*  PROT 7.2  ALBUMIN 3.3*   No results for input(s): LIPASE, AMYLASE in the last 168 hours. No results for  input(s): AMMONIA in the last 168 hours. Recent Labs    12/02/22 1451 02/05/23 1549 06/15/23 1207 09/28/23 1230 09/29/23 0540 09/30/23 0621 10/25/23 1715 11/10/23 1713 11/18/23 1440 11/21/23 1625  BUN 18 17 44* 32* 25* 25* 25* 27* 29* 39*  CREATININE 1.36* 1.38* 1.73* 2.17* 2.10* 1.97* 1.66* 1.76* 1.76* 2.05*    Cardiac Enzymes: No results for input(s): CKTOTAL, CKMB, CKMBINDEX, TROPONINI in the last 168 hours. BNP (last 3 results) No results for input(s): PROBNP in the last 8760 hours. HbA1C: No results for input(s): HGBA1C in the last 72 hours. CBG: Recent Labs  Lab 11/18/23 1440 11/21/23 1919 11/21/23 2008  GLUCAP 310* 389* 340*   Lipid Profile: No results for input(s): CHOL, HDL, LDLCALC, TRIG, CHOLHDL, LDLDIRECT in the last 72 hours. Thyroid  Function Tests: No results for input(s): TSH, T4TOTAL, FREET4, T3FREE, THYROIDAB in the last 72 hours. Anemia Panel: No results for input(s): VITAMINB12, FOLATE, FERRITIN, TIBC, IRON, RETICCTPCT in the last 72 hours. Urine analysis:    Component Value Date/Time   COLORURINE YELLOW 09/28/2023 1223   APPEARANCEUR CLOUDY (A) 09/28/2023 1223   LABSPEC 1.015 09/28/2023 1223   LABSPEC 1.015 12/08/2016 1408   PHURINE 6.5 09/28/2023 1223   GLUCOSEU NEGATIVE 09/28/2023 1223   GLUCOSEU Negative 12/08/2016 1408   HGBUR MODERATE (A) 09/28/2023 1223   BILIRUBINUR NEGATIVE 09/28/2023 1223   BILIRUBINUR negative 07/05/2020 1611   BILIRUBINUR Negative 12/08/2016 1408   KETONESUR NEGATIVE 09/28/2023 1223   PROTEINUR 100 (A) 09/28/2023 1223   UROBILINOGEN 0.2 07/05/2020 1611   UROBILINOGEN 0.2 12/08/2016 1408   NITRITE POSITIVE (A) 09/28/2023 1223   LEUKOCYTESUR LARGE (A) 09/28/2023 1223   LEUKOCYTESUR Moderate 12/08/2016 1408   Radiological Exams on Admission: DG Chest 1 View  Result Date: 11/21/2023 EXAM: 1 VIEW(S) XRAY OF THE CHEST 11/21/2023 04:13:00 PM COMPARISON: 02/05/2023  CLINICAL HISTORY: Chest pain FINDINGS: LUNGS AND PLEURA: Low lung volumes with bronchovascular crowding. No focal airspace consolidation. No pleural effusion. No pneumothorax. HEART AND MEDIASTINUM: Mild cardiomegaly. Aortic atherosclerosis. BONES AND SOFT TISSUES: Lower cervical fusion hardware. Cholecystectomy clips. Multilevel thoracic osteophytosis. No acute osseous abnormality. IMPRESSION: 1. Lower lung volumes. No acute cardiopulmonary abnormality. Electronically signed by: Rogelia Myers MD 11/21/2023 05:08 PM EST RP Workstation: [id number]   DG Shoulder Left Result Date: 11/21/2023 EXAM: 1 VIEW XRAY OF THE LEFT SHOULDER 11/21/2023 04:13:00 PM COMPARISON: None available. CLINICAL HISTORY: Chest pain FINDINGS: BONES AND JOINTS: Osteopenia. Moderate glenohumeral joint osteoarthritis. No acute fracture or dislocation. Mild osteoarthritis of the acromioclavicular joint. SOFT TISSUES: Visualized lung is unremarkable. Aortic atherosclerosis. IMPRESSION: 1. No acute fracture or dislocation. 2. Glenohumeral and AC joint osteoarthritis. Electronically signed by: Rogelia Myers MD 11/21/2023 05:06 PM EST RP Workstation: [id number]   CT Head Wo Contrast Result Date: 11/21/2023 EXAM: CT HEAD WITHOUT CONTRAST 11/21/2023 03:44:14 PM TECHNIQUE: CT of the head was performed without the administration of intravenous contrast. Automated exposure control, iterative reconstruction, and/or weight based adjustment of the mA/kV was utilized to reduce the radiation dose to as low as reasonably achievable. COMPARISON: 09/28/2023 CLINICAL HISTORY: Headache, increasing frequency or severity FINDINGS: BRAIN AND VENTRICLES: No acute hemorrhage. No evidence of acute infarct. Chronic left cerebellar infarction. Similar appearance of chronic microvascular ischemic changes. Mild age related volume loss. No hydrocephalus. No extra-axial collection. No mass effect or midline shift. Atherosclerotic calcifications within the cavernous  internal carotid and vertebral arteries. ORBITS: Bilateral lens replacement. SINUSES: Chronic right sphenoid sinusitis. Additional mucosal thickening in the ethmoid sinuses, left greater than right. SOFT TISSUES AND SKULL: Left frontal scalp lipoma. No skull fracture. IMPRESSION: 1. No acute intracranial abnormality. 2. Chronic left cerebellar infarction. 3. Chronic microvascular ischemic changes with mild age-related volume loss. 4. Chronic right sphenoid sinusitis with additional mucosal thickening in the ethmoid sinuses, left greater than right. Electronically signed by: Donnice Mania MD 11/21/2023 04:45 PM EST RP Workstation: [id number]   Data Reviewed: Relevant notes from primary care and specialist visits, past discharge summaries as available in EHR, including Care Everywhere . Prior diagnostic testing as pertinent to current admission diagnoses, Updated medications and problem lists for reconciliation .ED course, including vitals, labs, imaging, treatment and response to treatment,Triage notes, nursing and pharmacy notes and ED provider's notes.Notable results as noted in HPI.Discussed case with EDMD/ ED APP/ or Specialty MD on call and as needed.  Assessment & Plan  >> Right-sided headache: Pt is right sided headache that has been going on for a while now. Intermittently blurred vision in both eyes. No recent eye exam. Right sided tenderness on scalp on temporal side of scalp not localized to temporal artery, less likely d/d is Temporal arteritis. Will get TA usg. Sinusitis also could be his culprit and we will start iv abx.   >> Left shoulder pain: Prn pain control. We will obtain mri shoulder.    >> DM II: Glycemic protocol.  Swallow evaluation carb consistent diet.  Continue patient on his Tresiba  at current dose.   >>CAD/ Elevated Troponin: Cont Troponin trend and cardiology consulted by edmd and do not feel this is ACS. Continue patient on aspirin , Eliquis .   >>H/O strokes / CVA /  H/O Pulmonary embolism: Continue patient on his current anticoagulation with Eliquis .  >>LE edema: Trace , do not suspect this is volume related  we will obtain LE venous doppler.    >> AKI on CKD stage IIIa: Lab Results  Component Value Date   CREATININE 2.05 (H) 11/21/2023   CREATININE 1.76 (H) 11/18/2023   CREATININE 1.76 (H) 11/10/2023  We will follow and gentle IVF hydration. BNP pending.    >> BPH: Continue Flomax / Finasteride .  Strict I's and O's.   >> Chronic HFrEF: Clinically patient appears euvolemic.  Trace  lower extremity edema , no rales on exam we will hold patient's Lasix  and BP meds and also her hyperglycemia making him dehydrated  also as patient's lactic acid is elevated and will evaluate for same. Echo from 07/2022 shows GIDD, and ef of 55-60%.  >>Lactic acidosis: D/d include more volume related I suspect than infection. We will cont with repeat and IVF gentle hydration and bnp is pending.    >> Dementia: No known history of any behavioral disturbance. Will continue patient on his Aricept, BuSpar  and Lexapro .  >> Essential hypertension: Vitals:   11/21/23 1457 11/21/23 1645 11/21/23 1900 11/21/23 1930  BP: 139/80 (!) 140/75 132/74 122/71  This blood pressure fluctuation I suspect there may be orthostatic hypotension.  And will check for same.  Will currently hold his Lasix  and Cozaar .    DVT prophylaxis:  Eliquis   Consults:  Cardiology   Advance Care Planning:    Code Status: Full Code   Family Communication:  Daughter : tiffany .  Disposition Plan:  TBD. Severity of Illness: The appropriate patient status for this patient is OBSERVATION. Observation status is judged to be reasonable and necessary in order to provide the required intensity of service to ensure the patient's safety. The patient's presenting symptoms, physical exam findings, and initial radiographic and laboratory data in the context of their medical condition is felt to place  them at decreased risk for further clinical deterioration. Furthermore, it is anticipated that the patient will be medically stable for discharge from the hospital within 2 midnights of admission.   Unresulted Labs (From admission, onward)     Start     Ordered   11/22/23 0500  Comprehensive metabolic panel  Tomorrow morning,   R        11/21/23 2019   11/22/23 0500  CBC  Tomorrow morning,   R        11/21/23 2019   11/21/23 2007  Urinalysis, Routine w reflex microscopic -Urine, Random  Add-on,   AD       Question:  Specimen Source  Answer:  Urine, Random   11/21/23 2006   11/21/23 1954  Brain natriuretic peptide  Add-on,   AD        11/21/23 1953   11/21/23 1947  Magnesium  Add-on,   AD        11/21/23 1946   11/21/23 1946  Blood culture (routine x 2)  BLOOD CULTURE X 2,   R      11/21/23 1945   11/21/23 1923  C-reactive protein  Once,   AD        11/21/23 1923   11/21/23 1912  Sedimentation rate  Add-on,   AD        11/21/23 1911   11/21/23 1809  Beta-hydroxybutyric acid  Add-on,   AD        11/21/23 1812            Meds ordered this encounter  Medications   sodium chloride  0.9 % bolus 500 mL   0.9 %  sodium chloride  infusion  insulin  aspart (novoLOG ) injection 10 Units   aspirin  chewable tablet 324 mg   insulin  aspart (novoLOG ) injection 0-15 Units    Correction coverage::   Moderate (average weight, post-op)    CBG < 70::   Implement Hypoglycemia Standing Orders and refer to Hypoglycemia Standing Orders sidebar report    CBG 70 - 120::   0 units    CBG 121 - 150::   2 units    CBG 151 - 200::   3 units    CBG 201 - 250::   5 units    CBG 251 - 300::   8 units    CBG 301 - 350::   11 units    CBG 351 - 400::   15 units    CBG > 400:   call MD and obtain STAT lab verification   apixaban  (ELIQUIS ) tablet 5 mg   busPIRone  (BUSPAR ) tablet 5 mg   donepezil (ARICEPT) tablet 10 mg   escitalopram  (LEXAPRO ) tablet 20 mg   finasteride  (PROSCAR ) tablet 5 mg   tamsulosin   (FLOMAX ) capsule 0.4 mg   ampicillin-sulbactam (UNASYN) 1.5 g in sodium chloride  0.9 % 100 mL IVPB    Antibiotic Indication::   Other Indication (list below)    Other Indication::   Sinusitis   0.9 %  sodium chloride  infusion   sodium chloride  flush (NS) 0.9 % injection 3 mL   OR Linked Order Group    acetaminophen  (TYLENOL ) tablet 650 mg    acetaminophen  (TYLENOL ) suppository 650 mg   HYDROcodone -acetaminophen  (NORCO/VICODIN) 5-325 MG per tablet 1 tablet    Refill:  0   morphine  (PF) 2 MG/ML injection 2 mg   hydrALAZINE (APRESOLINE) injection 5 mg     Orders Placed This Encounter  Procedures   Blood culture (routine x 2)   CT Head Wo Contrast   DG Shoulder Left   DG Chest 1 View   US  TEMPORAL ARTERY RIGHT   MR SHOULDER LEFT WO CONTRAST   CBC with Differential/Platelet   Basic metabolic panel with GFR   Beta-hydroxybutyric acid   Sedimentation rate   C-reactive protein   Magnesium   Brain natriuretic peptide   Urinalysis, Routine w reflex microscopic -Urine, Random   Comprehensive metabolic panel   CBC   Diet heart healthy/carb modified Room service appropriate? Yes with Assist; Fluid consistency: Nectar Thick   Apply Diabetes Mellitus Care Plan   STAT CBG when hypoglycemia is suspected. If treated, recheck every 15 minutes after each treatment until CBG >/= 70 mg/dl   Refer to Hypoglycemia Protocol Sidebar Report for treatment of CBG < 70 mg/dl   Maintain IV access   Vital signs   Notify physician (specify)   Refer to Sidebar Report Mobility Protocol for Adult Inpatient   Initiate Adult Central Line Maintenance and Catheter Clearance Protocol for patients with central line (CVC, PICC, Port, Hemodialysis, Trialysis)   Daily weights   Intake and Output   Initiate CHG Protocol for patients in ICU/SD or any patient with a central line or foley catheter   Do not place and if present remove PureWick   Initiate Oral Care Protocol   Initiate Carrier Fluid Protocol   RN may  order General Admission PRN Orders utilizing General Admission PRN medications (through manage orders) for the following patient needs: allergy symptoms (Claritin), cold sores (Carmex), cough (Robitussin DM), eye irritation (Liquifilm Tears), hemorrhoids (Tucks), indigestion (Maalox), minor skin irritation (Hydrocortisone  Cream), muscle pain (Ben Gay), nose irritation (saline nasal spray) and  sore throat (Chloraseptic spray).   Cardiac Monitoring Continuous x 48 hours Indications for use: Other; Other indications for use: chf   Ambulate with assistance   Orthostatic vital signs   Full code   Consult to hospitalist   Inpatient consult to Cardiology Consult Timeframe: STAT - requires a response within one hour; STAT timeframe requires provider to provider communication, has the provider to provider communication been completed: Yes; Reason for Consult? [id number] ele...   Pulse oximetry check with vital signs   Oxygen therapy Mode or (Route): Nasal cannula; Liters Per Minute: 2; Keep O2 saturation between: greater than 92 %   I-Stat CG4 Lactic Acid   CBG monitoring, ED   CBG monitoring, ED   I-Stat CG4 Lactic Acid   ED EKG   EKG 12-Lead   Place in observation (patient's expected length of stay will be less than 2 midnights)   Aspiration precautions   Fall precautions   VAS US  EVAR DUPLEX   VAS US  LOWER EXTREMITY VENOUS (DVT)    Author: Mario LULLA Blanch, MD 12 pm- 8 pm. [facility]. 11/21/2023 8:19 PM Please note for any communication after hours contact [facility] Assigned provider on call on Amion.

## 2023-11-22 ENCOUNTER — Observation Stay (HOSPITAL_COMMUNITY)

## 2023-11-22 DIAGNOSIS — I82412 Acute embolism and thrombosis of left femoral vein: Secondary | ICD-10-CM | POA: Diagnosis present

## 2023-11-22 DIAGNOSIS — N1831 Chronic kidney disease, stage 3a: Secondary | ICD-10-CM | POA: Diagnosis present

## 2023-11-22 DIAGNOSIS — J329 Chronic sinusitis, unspecified: Secondary | ICD-10-CM | POA: Diagnosis present

## 2023-11-22 DIAGNOSIS — I5032 Chronic diastolic (congestive) heart failure: Secondary | ICD-10-CM | POA: Diagnosis present

## 2023-11-22 DIAGNOSIS — R609 Edema, unspecified: Secondary | ICD-10-CM | POA: Diagnosis not present

## 2023-11-22 DIAGNOSIS — I361 Nonrheumatic tricuspid (valve) insufficiency: Secondary | ICD-10-CM

## 2023-11-22 DIAGNOSIS — R519 Headache, unspecified: Secondary | ICD-10-CM | POA: Diagnosis not present

## 2023-11-22 DIAGNOSIS — F0390 Unspecified dementia without behavioral disturbance: Secondary | ICD-10-CM | POA: Diagnosis present

## 2023-11-22 DIAGNOSIS — I130 Hypertensive heart and chronic kidney disease with heart failure and stage 1 through stage 4 chronic kidney disease, or unspecified chronic kidney disease: Secondary | ICD-10-CM | POA: Diagnosis present

## 2023-11-22 DIAGNOSIS — N179 Acute kidney failure, unspecified: Secondary | ICD-10-CM | POA: Diagnosis present

## 2023-11-22 DIAGNOSIS — E1122 Type 2 diabetes mellitus with diabetic chronic kidney disease: Secondary | ICD-10-CM | POA: Diagnosis present

## 2023-11-22 DIAGNOSIS — E872 Acidosis, unspecified: Secondary | ICD-10-CM | POA: Diagnosis present

## 2023-11-22 LAB — HEMOGLOBIN AND HEMATOCRIT, BLOOD
HCT: 32.3 % — ABNORMAL LOW (ref 39.0–52.0)
Hemoglobin: 11.5 g/dL — ABNORMAL LOW (ref 13.0–17.0)

## 2023-11-22 LAB — CBC
HCT: 32.3 % — ABNORMAL LOW (ref 39.0–52.0)
Hemoglobin: 11.2 g/dL — ABNORMAL LOW (ref 13.0–17.0)
MCH: 31.5 pg (ref 26.0–34.0)
MCHC: 34.7 g/dL (ref 30.0–36.0)
MCV: 91 fL (ref 80.0–100.0)
Platelets: 134 K/uL — ABNORMAL LOW (ref 150–400)
RBC: 3.55 MIL/uL — ABNORMAL LOW (ref 4.22–5.81)
RDW: 12.5 % (ref 11.5–15.5)
WBC: 7 K/uL (ref 4.0–10.5)
nRBC: 0 % (ref 0.0–0.2)

## 2023-11-22 LAB — BLOOD CULTURE ID PANEL (REFLEXED) - BCID2

## 2023-11-22 LAB — COMPREHENSIVE METABOLIC PANEL WITH GFR
ALT: 17 U/L (ref 0–44)
AST: 35 U/L (ref 15–41)
Albumin: 2.7 g/dL — ABNORMAL LOW (ref 3.5–5.0)
Alkaline Phosphatase: 48 U/L (ref 38–126)
Anion gap: 12 (ref 5–15)
BUN: 43 mg/dL — ABNORMAL HIGH (ref 8–23)
CO2: 23 mmol/L (ref 22–32)
Calcium: 8.3 mg/dL — ABNORMAL LOW (ref 8.9–10.3)
Chloride: 101 mmol/L (ref 98–111)
Creatinine, Ser: 2.26 mg/dL — ABNORMAL HIGH (ref 0.61–1.24)
GFR, Estimated: 28 mL/min — ABNORMAL LOW (ref >60)
Glucose, Bld: 173 mg/dL — ABNORMAL HIGH (ref 70–99)
Potassium: 3.7 mmol/L (ref 3.5–5.1)
Sodium: 136 mmol/L (ref 135–145)
Total Bilirubin: 1 mg/dL (ref 0.0–1.2)
Total Protein: 5.4 g/dL — ABNORMAL LOW (ref 6.5–8.1)

## 2023-11-22 LAB — GLUCOSE, CAPILLARY
Glucose-Capillary: 164 mg/dL — ABNORMAL HIGH (ref 70–99)
Glucose-Capillary: 121 mg/dL — ABNORMAL HIGH (ref 70–99)
Glucose-Capillary: 238 mg/dL — ABNORMAL HIGH (ref 70–99)
Glucose-Capillary: 172 mg/dL — ABNORMAL HIGH (ref 70–99)
Glucose-Capillary: 294 mg/dL — ABNORMAL HIGH (ref 70–99)
Glucose-Capillary: 213 mg/dL — ABNORMAL HIGH (ref 70–99)

## 2023-11-22 LAB — ECHOCARDIOGRAM COMPLETE
Area-P 1/2: 3.42 cm2
S' Lateral: 2.6 cm

## 2023-11-22 LAB — LACTIC ACID, PLASMA: Lactic Acid, Venous: 1.9 mmol/L (ref 0.5–1.9)

## 2023-11-22 LAB — TROPONIN I (HIGH SENSITIVITY): Troponin I (High Sensitivity): 364 ng/L (ref <18)

## 2023-11-22 MED ORDER — PANTOPRAZOLE SODIUM 40 MG PO TBEC
40.0000 mg | DELAYED_RELEASE_TABLET | Freq: Every day | ORAL | Status: DC
  Administered 2023-11-22 – 2023-11-23 (×2): 40 mg via ORAL
  Filled 2023-11-22 (×2): qty 1

## 2023-11-22 MED ORDER — BUPROPION HCL ER (XL) 150 MG PO TB24
150.0000 mg | ORAL_TABLET | Freq: Every day | ORAL | Status: DC
  Administered 2023-11-22 – 2023-11-23 (×2): 150 mg via ORAL
  Filled 2023-11-22 (×2): qty 1

## 2023-11-22 MED ORDER — ADULT MULTIVITAMIN W/MINERALS CH
1.0000 | ORAL_TABLET | Freq: Every day | ORAL | Status: DC
  Administered 2023-11-22 – 2023-11-23 (×2): 1 via ORAL
  Filled 2023-11-22 (×2): qty 1

## 2023-11-22 MED ORDER — SODIUM CHLORIDE 0.9 % IV BOLUS: 500.0000 mL | Freq: Once | INTRAVENOUS | Status: DC | PRN

## 2023-11-22 MED ORDER — FLUTICASONE PROPIONATE 50 MCG/ACT NA SUSP: 2.0000 | Freq: Every day | NASAL | Status: DC | PRN

## 2023-11-22 MED ORDER — AMOXICILLIN-POT CLAVULANATE 500-125 MG PO TABS
1.0000 | ORAL_TABLET | Freq: Two times a day (BID) | ORAL | Status: DC
  Administered 2023-11-22 – 2023-11-23 (×2): 1 via ORAL
  Filled 2023-11-22 (×3): qty 1

## 2023-11-22 MED ORDER — MIRTAZAPINE 15 MG PO TABS: 7.5000 mg | ORAL_TABLET | Freq: Every evening | ORAL | Status: DC | PRN

## 2023-11-22 MED ORDER — SODIUM CHLORIDE 0.9 % IV SOLN: INTRAVENOUS | Status: DC

## 2023-11-22 MED ORDER — VALACYCLOVIR HCL 500 MG PO TABS
500.0000 mg | ORAL_TABLET | Freq: Every day | ORAL | Status: DC
  Administered 2023-11-22 – 2023-11-23 (×2): 500 mg via ORAL
  Filled 2023-11-22 (×2): qty 1

## 2023-11-22 MED ORDER — ALBUMIN HUMAN 25 % IV SOLN
25.0000 g | Freq: Once | INTRAVENOUS | Status: AC
  Administered 2023-11-22: 12.5 g via INTRAVENOUS
  Filled 2023-11-22: qty 100

## 2023-11-22 MED ORDER — VITAMIN D 25 MCG (1000 UNIT) PO TABS
1000.0000 [IU] | ORAL_TABLET | Freq: Every day | ORAL | Status: DC
  Administered 2023-11-22 – 2023-11-23 (×2): 1000 [IU] via ORAL
  Filled 2023-11-22 (×2): qty 1

## 2023-11-22 NOTE — Care Management Obs Status (Signed)
 MEDICARE OBSERVATION STATUS NOTIFICATION   Patient Details  Name: Steve Brown MRN: [id number] Date of Birth: 03/14/1940   Medicare Observation Status Notification Given:     Verbally reviewed observation notice with Steve Brown telephonically at [phone]. Will leave a copy in the patient room.       Steve Brown 11/22/2023, 10:57 AM

## 2023-11-22 NOTE — Progress Notes (Addendum)
 PROGRESS NOTE  Steve Brown FMW:[id number] DOB: 05-19-1940 DOA: 11/21/2023 PCP: [facility]   LOS: 0 days   Brief narrative:  Steve Brown is a 83 y.o. male with past medical history  of CVA, cervical stenosis, worsening dementia, coronary artery disease, type 2 diabetes, chronic diastolic HF, anxiety and depression, BPH, prostate cancer s/p TURB and seeding, lumbar radiculopathy, s/p neck fusion, chronic back pain, hereditary and idiopathic neuropathy, OSA, history of pulmonary embolism, chronic sinusitis, CKD 3A  presented to [facility] ED for evaluation of hyperglycemia and headache.  History of previous visits to the ED this month and was admitted  on 09/28/23 for possible TIA.  Stroke was ruled out.  In the ED vitals were stable.  Labs showed hyperglycemia at 564 and patient had creatinine elevation at 2.0.  CBC within normal limits.  EKG showed normal sinus rhythm.  Patient received normal saline bolus and was admitted hospital for further evaluation and treatment.   Assessment/Plan: Principal Problem:   Headache, temporal  Hypotension.  History of hypertension, hold antihypertensives.  Received IV fluid hydration with 1 L of normal saline bolus..  Continue to hold Lasix  and Cozaar .  Latest blood pressure of 116/58.  Right-sided headache.  Patient had temporal artery biopsy in April 2025 which was negative for arteritis.  Impression was sinusitis.  Empirically on antibiotic for sinusitis.  Continue supportive care  Left shoulder pain.  MRI of the shoulder has been ordered.  Diabetes mellitus type 2 with hyperglycemia.  Continue Tresiba , sliding scale insulin  and diabetic diet.  Likely patient is noncompliant with medications.  Lives by himself at home.  History of coronary artery disease/elevated troponin.  Cardiology was consulted by ED.  Troponins elevated but flat so cardiology did not feel that it was necessary for the patient to be seen..  Does not appear to be ACS.   Denies active chest pain.  Continue aspirin  and Eliquis .  Will get 2D echocardiogram to assess for wall motion abnormality.  History of stroke CVA and pulmonary embolism on Eliquis .  Uncertain compliance since patient has new DVT on the left lower extremity.  Lower extremity edema, left lower extremity DVT.  Trace.  Lower extremity venous duplex ultrasound shows evidence of DVT in the left proximal profunda vein.  Patient is already on Eliquis .  Uncertain regarding compliance with medication.  Acute kidney injury on CKD stage IIIa.  Continue hydration.  Check BMP.  Creatinine today at 2.2 from initial 2.0.  Baseline creatinine between 1.7-1.9.  BPH.  Continue Flomax /finasteride .  Chronic heart failure with reduced ejection fraction.  Continue to hold Lasix  since blood pressure marginally low.  Review of 2D echocardiogram from 07/2022 showed EF of 55 to 60%.  Will get repeat 2D echocardiogram at this time.  Lactic acidosis.  Likely secondary to volume depletion.  Received IV fluid hydration with normalized lactate at 1.9.  History of cognitive impairment, possible early dementia. No behavioral disturbance.  Continue Aricept BuSpar  and Lexapro    DVT prophylaxis:  apixaban  (ELIQUIS ) tablet 5 mg   Disposition:   Likely home with home health in 1 to 2 days/ALF.  Patient lives by himself at home.  Will get [facility] consultation due to recurrent ED visits and recent admission.  Will get PT evaluation  Status is: Observation  The patient will require care spanning > 2 midnights and should be moved to inpatient because: Pending clinical improvement, IV antibiotic, PT evaluation    Code Status:     Code Status: Full Code  Family Communication: Spoke with the patient's daughter on the phone and discussed about clinical condition of the patient.   Consultants: Verbal consult with cardiology  Procedures: None  Anti-infectives:  Unasyn IV  Anti-infectives (From admission, onward)    Start      Dose/Rate Route Frequency Ordered Stop   11/21/23 2000  ampicillin-sulbactam (UNASYN) 1.5 g in sodium chloride  0.9 % 100 mL IVPB        1.5 g 200 mL/hr over 30 Minutes Intravenous Every 12 hours 11/21/23 1945          Subjective: Today, patient was seen and examined at bedside.  Patient complains of headache, and denies any nausea vomiting fever chills or rigor.  Complains of sinus pain.  Denies any chest pain, shortness of breath or dyspnea.   Objective: Vitals:   11/22/23 0745 11/22/23 1023  BP: (!) 90/47 (!) 100/54  Pulse: 70   Resp: 16   Temp: 98.5 F (36.9 C)   SpO2: 100%     Intake/Output Summary (Last 24 hours) at 11/22/2023 1129 Last data filed at 11/22/2023 0330 Gross per 24 hour  Intake 500.04 ml  Output 300 ml  Net 200.04 ml   There were no vitals filed for this visit. There is no height or weight on file to calculate BMI.   Physical Exam:  GENERAL: Patient is alert awake and oriented. Not in obvious distress.  Elderly male HENT: No scleral pallor or icterus. Pupils equally reactive to light. Oral mucosa is moist, tenderness over the sinuses NECK: is supple, no gross swelling noted. CHEST: Diminished breath sounds bilaterally CVS: S1 and S2 heard, no murmur. Regular rate and rhythm.  ABDOMEN: Soft, non-tender, bowel sounds are present. EXTREMITIES: Bilateral lower extremity trace edema CNS: Cranial nerves are intact.  Moves all extremity SKIN: warm and dry without rashes.  Data Review: I have personally reviewed the following laboratory data and studies,  CBC: Recent Labs  Lab 11/18/23 1440 11/21/23 1625 11/22/23 0124 11/22/23 0419  WBC 5.7 6.2  --  7.0  NEUTROABS  --  4.3  --   --   HGB 13.8 12.6* 11.5* 11.2*  HCT 41.6 36.8* 32.3* 32.3*  MCV 94.5 92.0  --  91.0  PLT 178 148*  --  134*   Basic Metabolic Panel: Recent Labs  Lab 11/18/23 1440 11/21/23 1625 11/21/23 2010 11/22/23 0419  NA 137 132*  --  136  K 4.6 4.8  --  3.7  CL 99 92*   --  101  CO2 23 24  --  23  GLUCOSE 353* 564*  --  173*  BUN 29* 39*  --  43*  CREATININE 1.76* 2.05*  --  2.26*  CALCIUM  9.2 8.6*  --  8.3*  MG  --   --  2.0  --    Liver Function Tests: Recent Labs  Lab 11/18/23 1440 11/22/23 0419  AST 25 35  ALT 18 17  ALKPHOS 69 48  BILITOT 1.6* 1.0  PROT 7.2 5.4*  ALBUMIN 3.3* 2.7*   No results for input(s): LIPASE, AMYLASE in the last 168 hours. No results for input(s): AMMONIA in the last 168 hours. Cardiac Enzymes: No results for input(s): CKTOTAL, CKMB, CKMBINDEX, TROPONINI in the last 168 hours. BNP (last 3 results) Recent Labs    11/21/23 2010  BNP 541.3*    ProBNP (last 3 results) No results for input(s): PROBNP in the last 8760 hours.  CBG: Recent Labs  Lab 11/21/23 1919 11/21/23 2008  11/21/23 2353 11/22/23 0425 11/22/23 0817  GLUCAP 389* 340* 239* 164* 121*   Recent Results (from the past 240 hours)  Blood culture (routine x 2)     Status: None (Preliminary result)   Collection Time: 11/21/23  7:51 PM   Specimen: BLOOD  Result Value Ref Range Status   Specimen Description BLOOD RIGHT ANTECUBITAL  Final   Special Requests   Final    BOTTLES DRAWN AEROBIC AND ANAEROBIC Blood Culture adequate volume   Culture   Final    NO GROWTH < 12 HOURS Performed at [facility], [address], [place], [place] [address]    Report Status PENDING  Incomplete  Blood culture (routine x 2)     Status: None (Preliminary result)   Collection Time: 11/21/23  8:12 PM   Specimen: BLOOD  Result Value Ref Range Status   Specimen Description BLOOD SITE NOT SPECIFIED  Final   Special Requests   Final    BOTTLES DRAWN AEROBIC AND ANAEROBIC Blood Culture adequate volume   Culture   Final    NO GROWTH < 12 HOURS Performed at [facility], [address], [place], [place] [address]    Report Status PENDING  Incomplete     Studies: MR SHOULDER LEFT WO CONTRAST Result Date: 11/22/2023 CLINICAL DATA:   Left shoulder pain EXAM: MRI OF THE LEFT SHOULDER WITHOUT CONTRAST TECHNIQUE: Multiplanar, multisequence MR imaging of the shoulder was performed. No intravenous contrast was administered. COMPARISON:  None Available. FINDINGS: Rotator cuff:  Mild supraspinatus and subscapularis tendinopathy. Muscles: Diffuse moderate regional muscular atrophy although with relative sparing of the long head of the triceps and the trapezius muscle. Biceps long head: Moderate tendinopathy of the intra-articular segment. Acromioclavicular Joint: Mild degenerative AC joint spurring. Type II acromion. Physiologic fluid in the subacromial subdeltoid bursa. Glenohumeral Joint: Moderate degenerative glenohumeral arthropathy with chondral thinning and associated spurring. Small multilobulated concentration of fluid distally along the biceps recess of the joint without overt joint effusion. Labrum:  Unremarkable Bones: No significant extra-articular osseous abnormalities identified. Other: No supplemental non-categorized findings. IMPRESSION: 1. Mild supraspinatus and subscapularis tendinopathy. 2. Moderate tendinopathy of the intra-articular segment of the long head of the biceps. 3. Moderate degenerative glenohumeral arthropathy. 4. Mild degenerative AC joint spurring. 5. Diffuse moderate regional muscular atrophy although with relative sparing of the long head of the triceps and the trapezius muscle. Electronically Signed   By: Ryan Salvage M.D.   On: 11/22/2023 08:29   DG Chest 1 View Result Date: 11/21/2023 EXAM: 1 VIEW(S) XRAY OF THE CHEST 11/21/2023 04:13:00 PM COMPARISON: 02/05/2023 CLINICAL HISTORY: Chest pain FINDINGS: LUNGS AND PLEURA: Low lung volumes with bronchovascular crowding. No focal airspace consolidation. No pleural effusion. No pneumothorax. HEART AND MEDIASTINUM: Mild cardiomegaly. Aortic atherosclerosis. BONES AND SOFT TISSUES: Lower cervical fusion hardware. Cholecystectomy clips. Multilevel thoracic  osteophytosis. No acute osseous abnormality. IMPRESSION: 1. Lower lung volumes. No acute cardiopulmonary abnormality. Electronically signed by: Rogelia Myers MD 11/21/2023 05:08 PM EST RP Workstation: [id number]   DG Shoulder Left Result Date: 11/21/2023 EXAM: 1 VIEW XRAY OF THE LEFT SHOULDER 11/21/2023 04:13:00 PM COMPARISON: None available. CLINICAL HISTORY: Chest pain FINDINGS: BONES AND JOINTS: Osteopenia. Moderate glenohumeral joint osteoarthritis. No acute fracture or dislocation. Mild osteoarthritis of the acromioclavicular joint. SOFT TISSUES: Visualized lung is unremarkable. Aortic atherosclerosis. IMPRESSION: 1. No acute fracture or dislocation. 2. Glenohumeral and AC joint osteoarthritis. Electronically signed by: Rogelia Myers MD 11/21/2023 05:06 PM EST RP Workstation: [id number]  CT Head Wo Contrast Result Date: 11/21/2023 EXAM: CT HEAD WITHOUT CONTRAST 11/21/2023 03:44:14 PM TECHNIQUE: CT of the head was performed without the administration of intravenous contrast. Automated exposure control, iterative reconstruction, and/or weight based adjustment of the mA/kV was utilized to reduce the radiation dose to as low as reasonably achievable. COMPARISON: 09/28/2023 CLINICAL HISTORY: Headache, increasing frequency or severity FINDINGS: BRAIN AND VENTRICLES: No acute hemorrhage. No evidence of acute infarct. Chronic left cerebellar infarction. Similar appearance of chronic microvascular ischemic changes. Mild age related volume loss. No hydrocephalus. No extra-axial collection. No mass effect or midline shift. Atherosclerotic calcifications within the cavernous internal carotid and vertebral arteries. ORBITS: Bilateral lens replacement. SINUSES: Chronic right sphenoid sinusitis. Additional mucosal thickening in the ethmoid sinuses, left greater than right. SOFT TISSUES AND SKULL: Left frontal scalp lipoma. No skull fracture. IMPRESSION: 1. No acute intracranial abnormality. 2. Chronic left  cerebellar infarction. 3. Chronic microvascular ischemic changes with mild age-related volume loss. 4. Chronic right sphenoid sinusitis with additional mucosal thickening in the ethmoid sinuses, left greater than right. Electronically signed by: Donnice Mania MD 11/21/2023 04:45 PM EST RP Workstation: [id number]      Vernal Alstrom, MD  [facility] 11/22/2023  If 7PM-7AM, please contact night-coverage

## 2023-11-22 NOTE — Progress Notes (Signed)
 PHARMACY - PHYSICIAN COMMUNICATION CRITICAL VALUE ALERT - BLOOD CULTURE IDENTIFICATION (BCID)  Steve Brown is an 83 y.o. male who presented to [facility] on 11/21/2023 with a chief complaint of hyperglycemia + HA  Assessment:   Blood culture 1-4 vials [aerobic only] showing GPC in pairs and clusters BCID staph sp / no resistances noted   Name of physician (or Provider) Contacted: Dr. Charlton  Current antibiotics: Augmentin  500 mg PO bid  Changes to prescribed antibiotics recommended:  No changes at this time.   Results for orders placed or performed during the hospital encounter of 11/21/23  Blood Culture ID Panel (Reflexed) (Collected: 11/21/2023  8:12 PM)  Result Value Ref Range   Enterococcus faecalis NOT DETECTED NOT DETECTED   Enterococcus Faecium NOT DETECTED NOT DETECTED   Listeria monocytogenes NOT DETECTED NOT DETECTED   Staphylococcus species DETECTED (A) NOT DETECTED   Staphylococcus aureus (BCID) NOT DETECTED NOT DETECTED   Staphylococcus epidermidis NOT DETECTED NOT DETECTED   Staphylococcus lugdunensis NOT DETECTED NOT DETECTED   Streptococcus species NOT DETECTED NOT DETECTED   Streptococcus agalactiae NOT DETECTED NOT DETECTED   Streptococcus pneumoniae NOT DETECTED NOT DETECTED   Streptococcus pyogenes NOT DETECTED NOT DETECTED   A.calcoaceticus-baumannii NOT DETECTED NOT DETECTED   Bacteroides fragilis NOT DETECTED NOT DETECTED   Enterobacterales NOT DETECTED NOT DETECTED   Enterobacter cloacae complex NOT DETECTED NOT DETECTED   Escherichia coli NOT DETECTED NOT DETECTED   Klebsiella aerogenes NOT DETECTED NOT DETECTED   Klebsiella oxytoca NOT DETECTED NOT DETECTED   Klebsiella pneumoniae NOT DETECTED NOT DETECTED   Proteus species NOT DETECTED NOT DETECTED   Salmonella species NOT DETECTED NOT DETECTED   Serratia marcescens NOT DETECTED NOT DETECTED   Haemophilus influenzae NOT DETECTED NOT DETECTED   Neisseria meningitidis NOT DETECTED NOT DETECTED    Pseudomonas aeruginosa NOT DETECTED NOT DETECTED   Stenotrophomonas maltophilia NOT DETECTED NOT DETECTED   Candida albicans NOT DETECTED NOT DETECTED   Candida auris NOT DETECTED NOT DETECTED   Candida glabrata NOT DETECTED NOT DETECTED   Candida krusei NOT DETECTED NOT DETECTED   Candida parapsilosis NOT DETECTED NOT DETECTED   Candida tropicalis NOT DETECTED NOT DETECTED   Cryptococcus neoformans/gattii NOT DETECTED NOT DETECTED      Choua Ikner BS, PharmD, BCPS Clinical Pharmacist 11/22/2023 8:30 PM  Contact: [phone] after 3 PM

## 2023-11-22 NOTE — Progress Notes (Signed)
 Echocardiogram 2D Echocardiogram has been performed.  Steve Brown 11/22/2023, 12:45 PM

## 2023-11-22 NOTE — Progress Notes (Signed)
 Bilateral lower extremity venous duplex has been completed. Preliminary results can be found in CV Proc through chart review.  Results were given to Dr. Sonjia.  11/22/23 10:47 AM Cathlyn Collet RVT

## 2023-11-22 NOTE — TOC Initial Note (Signed)
 Transition of Care [facility]) - Initial/Assessment Note    Patient Details  Name: Steve Brown MRN: [id number] Date of Birth: 11/26/1940  Transition of Care [facility]) CM/SW Contact:    Andrez JULIANNA George, RN Phone Number: 11/22/2023, 1:47 PM  Clinical Narrative:                    Expected Discharge Plan:  (TBD) Barriers to Discharge: Continued Medical Work up   Patient Goals and CMS Choice    Steve Brown is a 83 y.o. male with past medical history  of   CVA, TIA, CAD, T2DM, chronic diastolic HF, anxiety and depression, BPH, prostate cancer s/p TURB and seeding, lumbar radiculopathy, s/p neck fusion, chronic back pain, hereditary and idiopathic neuropathy, OSA, history of PE, chronic sinusitis CKD 3A who presented to [facility] ED for evaluation of hyperglycemia and headache.   Pt is from home alone. He says his daughter can check on him if needed.  Pt drives self but daughter could assist.  Pt manages his own medications. He is on oxygen at home through [employer]. He doesn't know the liter flow.   [facility] is aware of his admission.  ERE:[place], [place] SWBETHA [place]: [phone] ext: [phone] Pt is 20% services connected. Pt has appt Dec 9th for cards at [facility] and Dec 15th for PCP.   Awaiting therapy evals.  IP Care management following.        Expected Discharge Plan and Services       Living arrangements for the past 2 months: Apartment                                      Prior Living Arrangements/Services Living arrangements for the past 2 months: Apartment Lives with:: Self Patient language and need for interpreter reviewed:: Yes Do you feel safe going back to the place where you live?: Yes          Current home services: DME (cane/ walker/ shower seat/ WC/ BSC) Criminal Activity/Legal Involvement Pertinent to Current Situation/Hospitalization: No - Comment as needed  Activities of Daily Living   ADL Screening (condition at time of  admission) Independently performs ADLs?: No Does the patient have a NEW difficulty with bathing/dressing/toileting/self-feeding that is expected to last >3 days?: No Does the patient have a NEW difficulty with getting in/out of bed, walking, or climbing stairs that is expected to last >3 days?: No Does the patient have a NEW difficulty with communication that is expected to last >3 days?: No Is the patient deaf or have difficulty hearing?: No Does the patient have difficulty seeing, even when wearing glasses/contacts?: No Does the patient have difficulty concentrating, remembering, or making decisions?: No  Permission Sought/Granted                  Emotional Assessment Appearance:: Appears stated age Attitude/Demeanor/Rapport: Engaged Affect (typically observed): Accepting Orientation: : Oriented to Self, Oriented to Place, Oriented to  Time, Oriented to Situation   Psych Involvement: No (comment)  Admission diagnosis:  Hyperglycemia [R73.9] Atypical chest pain [R07.89] Elevated troponin [R79.89] Headache, temporal [R51.9] Intractable episodic headache, unspecified headache type [R51.9] Patient Active Problem List   Diagnosis Date Noted   Headache, temporal 11/21/2023   Left facial numbness 09/29/2023   Left arm numbness 09/29/2023   Left sided numbness 09/28/2023   Jaw pain, non-TMJ 03/20/2022   Temporal pain 03/20/2022   Jaw  claudication 03/20/2022   Dizziness on standing 05/18/2021   Vision changes 05/18/2021   Chronic ischemic vertebrobasilar artery cerebellar stroke 05/18/2021   Amaurosis fugax, both eyes 01/18/2021   Transient diplopia 01/18/2021   Hereditary and idiopathic neuropathy, unspecified 11/20/2020   Hyperglycemia due to type 2 diabetes mellitus (HCC) 11/20/2020   Obesity 11/20/2020   Pure hypercholesterolemia 11/20/2020   Syncope 05/26/2020   Back pain 05/26/2020   Acute metabolic encephalopathy 05/26/2020   Lumbar spinal stenosis 08/15/2019    Nonspecific chest pain 08/11/2019   Shortness of breath    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 (HCC) 10/21/2018   Nocturnal hypoxemia 10/21/2018   Radiation therapy complication 07/31/2017   Primary prostate cancer (HCC) 07/31/2017   Anemia 07/31/2017   Vertigo due to cerebrovascular disease 07/31/2017   Poor compliance with CPAP treatment 07/31/2017   Absolute anemia 05/16/2017   Avitaminosis D 05/16/2017   Benign essential HTN 05/16/2017   Benign prostatic hyperplasia without urinary obstruction 05/16/2017   Clinical depression 05/16/2017   Constipation 05/16/2017   Current drug use 05/16/2017   Diabetic neuropathy (HCC) 05/16/2017   Genital herpes 05/16/2017   Infarction of lung due to iatrogenic pulmonary embolism (HCC) 05/16/2017   Sciatica associated with disorder of lumbar spine 05/16/2017   Arteriosclerosis of coronary artery 05/16/2017   Artery disease, cerebral 05/16/2017   Apnea, sleep 05/16/2017   Arthralgia of hip or thigh 05/16/2017   Malignant neoplasm of prostate (HCC) 09/28/2016   Vascular dementia in remission (HCC) 09/28/2016   Remote history of stroke 09/28/2016   Acute kidney injury 05/18/2016   Dehydration 05/18/2016   Near syncope 05/18/2016   Orthostatic hypotension 05/18/2016   CKD (chronic kidney disease), stage II 04/26/2016   UTI (urinary tract infection) 04/26/2016   Encounter for counseling on use of CPAP 11/18/2015   Chronic cholecystitis with calculus 11/02/2015   Stroke, 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HCC) 04/22/2015   TIA (transient ischemic attack) 11/03/2014   Obstructive sleep apnea 05/18/2014   White matter disease 08/14/2013   Abnormal x-ray of temporomandibular joint 05/30/2013   Chronic infection of sinus 05/30/2013   Cough 05/30/2013   Fatigue 05/30/2013   Unsteady gait 05/19/2013   Combined fat and carbohydrate induced hyperlipemia 04/24/2013   Syncope 03/20/2013   Angina pectoris (HCC) 10/08/2012   Hypertension    Stroke  (HCC)    History of TIAs    Lumbago    Other and unspecified hyperlipidemia    History of cardiovascular disorder    Unwitnessed fall    Pain in joint, multiple sites    Degeneration of cervical intervertebral disc    Cardiovascular disease    History of pulmonary embolism: June 2014,  Takes Xarelto  07/01/2012    Class: History of   Hemiparesis (HCC) 07/18/2011   Diabetes mellitus type 2 with complications (HCC) 07/18/2011   CAD in native artery 07/18/2011   History of recurrent TIAs 07/18/2011   PCP:  [facility] Pharmacy:   [id number] - [place], [place] - [address] [place] [address] Phone: [phone] Fax: [phone]  [facility] - [place], [place] - [address] [place] [address] Phone: [phone] Fax: [phone]  [facility] [address] [place] [address] Phone: [phone] Fax: [phone]     Social Drivers of Health (SDOH) Social History: SDOH Screenings   Food Insecurity: No Food Insecurity (11/21/2023)  Housing: Low Risk  (11/21/2023)  Transportation Needs: No Transportation Needs (11/21/2023)  Utilities: Not At Risk (11/21/2023)  Depression (PHQ2-9): Low Risk  (09/09/2019)  Recent Concern: Depression (PHQ2-9) - Medium Risk (08/01/2019)  Financial Resource Strain: Low Risk  (02/10/2019)  Physical Activity: Inactive (09/09/2019)  Social Connections: Moderately Integrated (11/21/2023)  Tobacco Use: Low Risk  (11/21/2023)   SDOH Interventions:     Readmission Risk Interventions     No data to display

## 2023-11-22 NOTE — Progress Notes (Signed)
 PT Cancellation Note  Patient Details Name: Steve Brown MRN: [id number] DOB: 1940-02-23   Cancelled Treatment:    Reason Eval/Treat Not Completed: (P) Medical issues which prohibited therapy. Pt found to have acute L leg DVT. Will plan to hold off on therapy until addressed via interventions/blood thinners. Will plan to follow-up tomorrow as able.   Theo Ferretti, PT, DPT Acute Rehabilitation Services  Office: [phone]    Theo CHRISTELLA Ferretti 11/22/2023, 1:57 PM

## 2023-11-22 NOTE — Hospital Course (Signed)
 Steve Brown is a 83 y.o. male with past medical history  of CVA, cervical stenosis, worsening dementia, coronary artery disease, type 2 diabetes, chronic diastolic HF, anxiety and depression, BPH, prostate cancer s/p TURB and seeding, lumbar radiculopathy, s/p neck fusion, chronic back pain, hereditary and idiopathic neuropathy, OSA, history of pulmonary embolism, chronic sinusitis, CKD 3A  presented to [facility] ED for evaluation of hyperglycemia and headache.  History of previous visits to the ED this month and was admitted  on 09/28/23 for possible TIA.  Stroke was ruled out.  In the ED vitals were stable.  Labs showed hyperglycemia at 564 and patient had creatinine elevation at 2.0.  CBC within normal limits.  EKG showed normal sinus rhythm.  Patient received normal saline bolus and was admitted hospital for further evaluation and treatment.  Hypotension.  History of hypertension, hold antihypertensives.  Received IV fluid hydration.  Continue to hold Lasix  and Cozaar .  Right-sided headache.  Check temporal artery ultrasound has been started on antibiotic for possible sinusitis.  Left shoulder pain.  MRI of the shoulder has been ordered.  Diabetes mellitus type 2 with hyperglycemia.  Continue Tresiba , sliding scale insulin  and diabetic diet.  History of coronary artery disease/elevated troponin.  Cardiology was consulted by ED.  Troponins elevated but flat.  Does not appear to be ACS.  Continue aspirin  and Eliquis .  History of stroke CVA and pulmonary embolism Continue with Eliquis .  Lower extremity edema.  Trace.  Check lower extremity venous duplex.  Acute kidney injury on CKD stage IIIa.  Continue hydration.  Check BMP.  BPH.  Continue Flomax /finasteride .  Chronic heart failure with reduced ejection fraction.  Continue to hold Lasix  since blood pressure marginally low.  2D echocardiogram from 07/2022 showed EF of 55 to 60%.  Lactic acidosis.  Likely secondary to lack of volume.  Received  IV fluid hydration.  History of dementia no behavioral disturbance.  Continue Aricept BuSpar  and Lexapro 

## 2023-11-22 NOTE — Significant Event (Addendum)
 Rapid Response Event Note   Reason for Call :  Hypotension-SBP-60s and unresponsiveness.  Per RN, pt arrived to unit alert and oriented, SBP-110s.  He received norco at 2230  Initial Focused Assessment:  Pt lying in bed with eyes open. He will open his eyes to voice, follow commands in all extremities(though very weak), and answer questions. Pt c/o HA/SOB/dizziness. Pupils 2 and equal.  Lungs clear t/o. Skin cool/clammy.  HR-59, BP-83/52, RR-16, SpO2-98% on 2L [place].   Interventions:  500cc NS bolus CBG-239 EKG-SB with nonspecific T wave abnormality H/H Trop LA  Plan of Care:  BP improved to 98/62 with bolus. Pt mental status better. Await lab values. Continue to monitor pt closely. Please call RRT if further assistance needed.  Event Summary:   MD Notified: Dr. Charlton notified and came to bedside Call Time:0021 Arrival Time:0024 End Upfz:9945  Tish Graeme Piety, RN

## 2023-11-22 NOTE — Care Management Obs Status (Signed)
 MEDICARE OBSERVATION STATUS NOTIFICATION   Patient Details  Name: Ival Pacer MRN: [id number] Date of Birth: 1940/06/03   Medicare Observation Status Notification Given:  Yes Called Daughter as per patients request obs signed and copy given.    Lenaya Pietsch 11/22/2023, 11:59 AM

## 2023-11-22 NOTE — Progress Notes (Addendum)
 Patient BP dropped from 119/72 to 59/41, patient was also unresponsive, skin cool and clammy on touch, RRT called, patient received bolus of 500cc NS. Patient slowly became responsive after receive the bolus, patient c/o CP, lightheaded, dizziness. Provider was at bedside assessed patient, EKG and lab worked ordered, EKG performed by this RN, no T wave abnormality. Patient BP was 121/68 post bolus, patient stated his CP was gone and felt better with no new complaints. Call bell within reach, bed alarm on, plan of care ongoing.    Daril ORN RN

## 2023-11-23 ENCOUNTER — Telehealth: Payer: Self-pay

## 2023-11-23 DIAGNOSIS — R519 Headache, unspecified: Secondary | ICD-10-CM | POA: Diagnosis not present

## 2023-11-23 LAB — BASIC METABOLIC PANEL WITH GFR
Anion gap: 11 (ref 5–15)
BUN: 40 mg/dL — ABNORMAL HIGH (ref 8–23)
CO2: 24 mmol/L (ref 22–32)
Calcium: 8.1 mg/dL — ABNORMAL LOW (ref 8.9–10.3)
Chloride: 104 mmol/L (ref 98–111)
Creatinine, Ser: 1.93 mg/dL — ABNORMAL HIGH (ref 0.61–1.24)
GFR, Estimated: 34 mL/min — ABNORMAL LOW (ref >60)
Glucose, Bld: 98 mg/dL (ref 70–99)
Potassium: 3.5 mmol/L (ref 3.5–5.1)
Sodium: 139 mmol/L (ref 135–145)

## 2023-11-23 LAB — CBC
HCT: 31 % — ABNORMAL LOW (ref 39.0–52.0)
Hemoglobin: 10.9 g/dL — ABNORMAL LOW (ref 13.0–17.0)
MCH: 32 pg (ref 26.0–34.0)
MCHC: 35.2 g/dL (ref 30.0–36.0)
MCV: 90.9 fL (ref 80.0–100.0)
Platelets: 120 K/uL — ABNORMAL LOW (ref 150–400)
RBC: 3.41 MIL/uL — ABNORMAL LOW (ref 4.22–5.81)
RDW: 12.6 % (ref 11.5–15.5)
WBC: 5.7 K/uL (ref 4.0–10.5)
nRBC: 0 % (ref 0.0–0.2)

## 2023-11-23 LAB — PHOSPHORUS: Phosphorus: 3 mg/dL (ref 2.5–4.6)

## 2023-11-23 LAB — MAGNESIUM: Magnesium: 2 mg/dL (ref 1.7–2.4)

## 2023-11-23 LAB — GLUCOSE, CAPILLARY
Glucose-Capillary: 121 mg/dL — ABNORMAL HIGH (ref 70–99)
Glucose-Capillary: 119 mg/dL — ABNORMAL HIGH (ref 70–99)
Glucose-Capillary: 244 mg/dL — ABNORMAL HIGH (ref 70–99)
Glucose-Capillary: 338 mg/dL — ABNORMAL HIGH (ref 70–99)

## 2023-11-23 MED ORDER — AMOXICILLIN-POT CLAVULANATE 500-125 MG PO TABS: 1.0000 | ORAL_TABLET | Freq: Two times a day (BID) | ORAL | 0 refills | Status: AC

## 2023-11-23 MED ORDER — LIDOCAINE 5 % EX PTCH
1.0000 | MEDICATED_PATCH | CUTANEOUS | Status: DC
  Administered 2023-11-23: 1 via TRANSDERMAL
  Filled 2023-11-23: qty 1

## 2023-11-23 MED ORDER — LIDOCAINE 5 % EX PTCH: 2.0000 | MEDICATED_PATCH | CUTANEOUS | Status: DC

## 2023-11-23 NOTE — Progress Notes (Signed)
 Discharge instructions provided to patient. All medications, follow up appointments, and discharge instructions provided. IV out. Monitor off. Discharging home with daughter. Daughter called and informed of discharge and discharge paperwork.

## 2023-11-23 NOTE — Evaluation (Addendum)
 Physical Therapy Evaluation Patient Details Name: Steve Brown MRN: [id number] DOB: 1940-10-18 Today's Date: 11/23/2023  History of Present Illness  83 y.o. male presents to [facility] 11/21/23 for evaluation of hyperglycemia and h/a. Admitted with hypotension, sinusitis, and LLE DVT (11/13). PMHx: CVA, TIA, CAD, T2DM, chronic diastolic HF, anxiety and depression, BPH, prostate cancer s/p TURB and seeding, lumbar radiculopathy, s/p neck fusion, chronic back pain, hereditary and idiopathic neuropathy, OSA, history of PE, chronic sinusitis CKD 3A   Clinical Impression  PTA pt was independent for mobility with use of SP cane. Pt was receiving OP PT and going the [employer] to exercise. Pt presents below functional mobility baseline with decreased activity tolerance, impaired cognition, fatigue, and impaired balance. Pt was unsteady with use of SP cane with two small posterior losses of balance. Improved stability with use of RW with supervision for safety. Pt was only able to tolerate ambulating 172ft with RW due to fatigue. Attempted to use portable step to practice stair negotiation, however, pt reported being unable to attempt due to fatigue. Pt has 16 steps to enter apartment and will need to attempt stair negotiation prior to d/c home. There are also concerns with cognition with pt unable to recall emergency number or where he lives. If pt is able to have 24/7 assist at home, would recommend home with HHPT. Acute PT to follow to address functional deficits.         If plan is discharge home, recommend the following: Assist for transportation;Help with stairs or ramp for entrance   Can travel by private vehicle    Yes    Equipment Recommendations None recommended by PT     Functional Status Assessment Patient has had a recent decline in their functional status and demonstrates the ability to make significant improvements in function in a reasonable and predictable amount of time.     Precautions /  Restrictions Precautions Precautions: Fall Recall of Precautions/Restrictions: Intact Restrictions Weight Bearing Restrictions Per Provider Order: No      Mobility  Bed Mobility Overal bed mobility: Needs Assistance Bed Mobility: Supine to Sit, Sit to Supine    Supine to sit: Supervision, HOB elevated Sit to supine: Supervision, HOB elevated   General bed mobility comments: increased time and effort with supervision for safety    Transfers Overall transfer level: Needs assistance Equipment used: Rolling walker (2 wheels), Straight cane Transfers: Sit to/from Stand Sit to Stand: Contact guard assist, Supervision    General transfer comment: CGA with use of SP cane due to slight posterior lean. Supervision with RW with cues for hand placement    Ambulation/Gait Ambulation/Gait assistance: Supervision, Min assist Gait Distance (Feet): 120 Feet Assistive device: Rolling walker (2 wheels), Straight cane Gait Pattern/deviations: Step-through pattern, Decreased stride length Gait velocity: decr    General Gait Details: unsteady with use of SP cane with x2 small posterior losses of balance requiring MinA. Improved stability with use of RW with supervision for safety. Gait distance limited by fatigue  Stairs  General stair comments: unable to attempt due to fatigue     Balance Overall balance assessment: Needs assistance Sitting-balance support: No upper extremity supported, Feet supported Sitting balance-Leahy Scale: Good     Standing balance support: Bilateral upper extremity supported, During functional activity, Reliant on assistive device for balance Standing balance-Leahy Scale: Poor Standing balance comment: reliant on bilateral UE support       Pertinent Vitals/Pain Pain Assessment Pain Assessment: Faces Faces Pain Scale: Hurts even more Pain Location:  headache Pain Descriptors / Indicators: Headache, Discomfort Pain Intervention(s): Limited activity within  patient's tolerance, Monitored during session, Repositioned    Home Living Family/patient expects to be discharged to:: Private residence Living Arrangements: Alone Available Help at Discharge: Family;Available PRN/intermittently Type of Home: Apartment Home Access: Stairs to enter Entrance Stairs-Rails: Right Entrance Stairs-Number of Steps: 16   Home Layout: One level Home Equipment: Agricultural Consultant (2 wheels);Cane - single point      Prior Function Prior Level of Function : Independent/Modified Independent;Driving    Mobility Comments: Ind with occasional use of SP cane ADLs Comments: Ind     Extremity/Trunk Assessment   Upper Extremity Assessment Upper Extremity Assessment: Defer to OT evaluation    Lower Extremity Assessment Lower Extremity Assessment: Generalized weakness;LLE deficits/detail LLE Deficits / Details: reports hx of radiculopathy with shooting pain at rest and with movement    Cervical / Trunk Assessment Cervical / Trunk Assessment: Normal  Communication   Communication Communication: No apparent difficulties    Cognition Arousal: Alert Behavior During Therapy: WFL for tasks assessed/performed   PT - Cognitive impairments: No apparent impairments    Following commands: Intact       Cueing Cueing Techniques: Verbal cues     General Comments General comments (skin integrity, edema, etc.): Cleared for PT by Pokhrel MD     PT Assessment Patient needs continued PT services  PT Problem List Decreased activity tolerance;Decreased balance;Decreased mobility;Decreased strength       PT Treatment Interventions DME instruction;Gait training;Stair training;Functional mobility training;Therapeutic activities;Therapeutic exercise;Balance training;Neuromuscular re-education;Patient/family education    PT Goals (Current goals can be found in the Care Plan section)  Acute Rehab PT Goals Patient Stated Goal: to keep working with therapy PT Goal  Formulation: With patient Time For Goal Achievement: 12/07/23 Potential to Achieve Goals: Good    Frequency Min 2X/week        AM-PAC PT 6 Clicks Mobility  Outcome Measure Help needed turning from your back to your side while in a flat bed without using bedrails?: None Help needed moving from lying on your back to sitting on the side of a flat bed without using bedrails?: A Little Help needed moving to and from a bed to a chair (including a wheelchair)?: A Little Help needed standing up from a chair using your arms (e.g., wheelchair or bedside chair)?: A Little Help needed to walk in hospital room?: A Little Help needed climbing 3-5 steps with a railing? : A Little 6 Click Score: 19    End of Session Equipment Utilized During Treatment: Gait belt Activity Tolerance: Patient tolerated treatment well Patient left: in bed;with call bell/phone within reach;with bed alarm set Nurse Communication: Mobility status PT Visit Diagnosis: Unsteadiness on feet (R26.81);Other abnormalities of gait and mobility (R26.89);Muscle weakness (generalized) (M62.81)    Time: 9173-9150 PT Time Calculation (min) (ACUTE ONLY): 23 min   Charges:   PT Evaluation $PT Eval Low Complexity: 1 Low   PT General Charges $$ ACUTE PT VISIT: 1 Visit    Kate ORN, PT, DPT Secure Chat Preferred  Rehab Office [phone]   Kate BRAVO Wendolyn 11/23/2023, 9:10 AM

## 2023-11-23 NOTE — Evaluation (Signed)
 Occupational Therapy Evaluation Patient Details Name: Steve Brown MRN: [id number] DOB: 04/21/1940 Today's Date: 11/23/2023   History of Present Illness   83 y.o. male presents to [facility] 11/21/23 for evaluation of hyperglycemia and h/a. Admitted with hypotension, sinusitis, and LLE DVT (11/13). Pt also with shoulder pain for which he was supposed to have injections but states he missed the appt. PMHx: CVA, TIA, CAD, T2DM, chronic diastolic HF, anxiety and depression, BPH, prostate cancer s/p TURB and seeding, lumbar radiculopathy, s/p neck fusion, chronic back pain, hereditary and idiopathic neuropathy, OSA, history of PE, chronic sinusitis CKD 3A     Clinical Impressions Pt admitted with the above diagnosis and has the deficits outlined below. Pt would benefit from cont OT to attempt to reach baseline level of adl functioning which pt states is modified independent including driving.  Pt currently is not tolerating enough activity to go home fatiguing after walking in room to do adls but has 16 steps to manage to his apartment at home where he lives alone. Pt with noted cognitive deficits thinking it was 2026, stating 611 is emergency number and not recalling what area of [place] he lives in.  Pt has missed several appts lately per report to get L shoulder injected for which he as taken off blood thinners but cannot state why he missed the appts.  Due to above deficits and pt reporting he feels more confused than normal, do not feel pt should be living at home alone based on todays findings with cognition and inability to tolerate much activity and living on second floor of apartment complex.  Feel SNF may be best option to see if cognition and activity tolerance improve in order for this pt to return home alone. If 24 hour assist available for cognition improves, pt may be able to dc home with HHOT.  Daughter endorses memory deficits PTA bit deficits on eval today appear more severe.  Will  continue to see and rec ST for further cognitive testing.      If plan is discharge home, recommend the following:   A little help with walking and/or transfers;A little help with bathing/dressing/bathroom;Assistance with cooking/housework;Assist for transportation;Help with stairs or ramp for entrance     Functional Status Assessment   Patient has had a recent decline in their functional status and demonstrates the ability to make significant improvements in function in a reasonable and predictable amount of time.     Equipment Recommendations   None recommended by OT     Recommendations for Other Services         Precautions/Restrictions   Precautions Precautions: Fall Recall of Precautions/Restrictions: Impaired Restrictions Weight Bearing Restrictions Per Provider Order: No     Mobility Bed Mobility Overal bed mobility: Needs Assistance Bed Mobility: Supine to Sit     Supine to sit: Supervision, HOB elevated     General bed mobility comments: increased time and effort with supervision for safety    Transfers Overall transfer level: Needs assistance Equipment used: Rolling walker (2 wheels), Straight cane Transfers: Sit to/from Stand Sit to Stand: Contact guard assist           General transfer comment: CG assist with walker and cues for hand placement requiring hand over hand to move one hand to chair to push up from chair to stand.      Balance Overall balance assessment: Needs assistance Sitting-balance support: No upper extremity supported, Feet supported Sitting balance-Leahy Scale: Good     Standing balance support:  Bilateral upper extremity supported, During functional activity, Reliant on assistive device for balance Standing balance-Leahy Scale: Poor Standing balance comment: reliant on bilateral UE support                           ADL either performed or assessed with clinical judgement   ADL Overall ADL's : Needs  assistance/impaired Eating/Feeding: Independent;Sitting   Grooming: Supervision/safety;Standing Grooming Details (indicate cue type and reason): pt stood at sink and fatigued quickly. Upper Body Bathing: Set up;Sitting   Lower Body Bathing: Minimal assistance;Sit to/from stand   Upper Body Dressing : Set up;Sitting   Lower Body Dressing: Minimal assistance;Sit to/from stand;Cueing for compensatory techniques   Toilet Transfer: Contact guard assist;Ambulation;Rolling walker (2 wheels) Toilet Transfer Details (indicate cue type and reason): pt walked to bathroom. Pt was sitting in urine on arrival. Pt with purewick bc he stated he could not use urinal but does usually know when he needs to go to the bathroom.  Bed changed and pt bathed at sink. Toileting- Clothing Manipulation and Hygiene: Total assistance;Sit to/from stand Toileting - Clothing Manipulation Details (indicate cue type and reason): pt with purewick and was sitting in urine on arrival     Functional mobility during ADLs: Contact guard assist;Rolling walker (2 wheels) General ADL Comments: Pt most limited safetywise by cognition and was slightly unsteady on feet during adls.  Pt lives alone and with current cognitive deficits feel he should not be alone or driving.     Vision Baseline Vision/History: 1 Wears glasses Ability to See in Adequate Light: 0 Adequate Patient Visual Report: No change from baseline Vision Assessment?: No apparent visual deficits     Perception Perception: Within Functional Limits       Praxis         Pertinent Vitals/Pain Pain Assessment Pain Assessment: Faces Faces Pain Scale: Hurts little more Pain Location: headache and shoulder pain Pain Descriptors / Indicators: Headache, Discomfort Pain Intervention(s): Limited activity within patient's tolerance, Monitored during session, Repositioned     Extremity/Trunk Assessment Upper Extremity Assessment Upper Extremity Assessment: LUE  deficits/detail LUE Deficits / Details: PT with limited shoulder flexion due to pain.  MRI completed here w some degeneration.  Pt was supposed to get L shoulder injected but missed the appt. LUE: Unable to fully assess due to pain LUE Sensation: decreased light touch   Lower Extremity Assessment Lower Extremity Assessment: Defer to PT evaluation LLE Deficits / Details: reports hx of radiculopathy with shooting pain at rest and with movement   Cervical / Trunk Assessment Cervical / Trunk Assessment: Neck Surgery   Communication Communication Communication: No apparent difficulties   Cognition Arousal: Alert Behavior During Therapy: WFL for tasks assessed/performed Cognition: Cognition impaired   Orientation impairments: Time, Situation Awareness: Intellectual awareness intact, Online awareness impaired Memory impairment (select all impairments): Short-term memory, Working memory Attention impairment (select first level of impairment): Sustained attention Executive functioning impairment (select all impairments): Problem solving, Reasoning OT - Cognition Comments: Pt stating emergency number is 611, could not recall area of [place] he lives in and thought it was Nov 2026. Pt states he pays his own bills, manages meds and drives.                 Following commands: Intact       Cueing  General Comments   Cueing Techniques: Verbal cues  Pt limited by decreased balance, activity tolerance and cognition.   Exercises  Shoulder Instructions      Home Living Family/patient expects to be discharged to:: Private residence Living Arrangements: Alone Available Help at Discharge: Family;Available PRN/intermittently Type of Home: Apartment Home Access: Stairs to enter Entrance Stairs-Number of Steps: 16 Entrance Stairs-Rails: Right Home Layout: One level     Bathroom Shower/Tub: Chief Strategy Officer: Standard Bathroom Accessibility: Yes How  Accessible: Accessible via walker Home Equipment: Rolling Walker (2 wheels);Cane - single point;Shower seat          Prior Functioning/Environment Prior Level of Function : Independent/Modified Independent;Driving             Mobility Comments: Ind with occasional use of SP cane that he leaves in car and walker that he can use inside. ADLs Comments: Independent. States he manages own meds, pays bills, drives to dr. margurette.    OT Problem List: Decreased strength;Decreased activity tolerance;Impaired balance (sitting and/or standing);Decreased cognition;Decreased safety awareness;Decreased knowledge of use of DME or AE;Pain   OT Treatment/Interventions: Self-care/ADL training;DME and/or AE instruction;Therapeutic activities;Patient/family education;Balance training      OT Goals(Current goals can be found in the care plan section)   Acute Rehab OT Goals Patient Stated Goal: to be able to feel stronger OT Goal Formulation: With patient Time For Goal Achievement: 12/07/23 Potential to Achieve Goals: Good ADL Goals Additional ADL Goal #1: Pt will walk to bathroom and complete all toileting with mod I Additional ADL Goal #2: Pt will gather all clothing with walker and dress self with mod I Additional ADL Goal #3: Pt will bathe self in shower on seat with mod I Additional ADL Goal #4: Pt will complete basic cognitive evaluation to better inform how to progress with pt living alone.   OT Frequency:  Min 2X/week    Co-evaluation              AM-PAC OT 6 Clicks Daily Activity     Outcome Measure Help from another person eating meals?: None Help from another person taking care of personal grooming?: A Little Help from another person toileting, which includes using toliet, bedpan, or urinal?: A Little Help from another person bathing (including washing, rinsing, drying)?: A Little Help from another person to put on and taking off regular upper body clothing?: A Little Help  from another person to put on and taking off regular lower body clothing?: A Little 6 Click Score: 19   End of Session Equipment Utilized During Treatment: Rolling walker (2 wheels) Nurse Communication: Mobility status;Other (comment) (cognition appears more impaired than usual)  Activity Tolerance: Patient limited by fatigue Patient left: in chair;with call bell/phone within reach;with chair alarm set  OT Visit Diagnosis: Unsteadiness on feet (R26.81);Other symptoms and signs involving cognitive function;Pain Pain - Right/Left: Left Pain - part of body: Shoulder                Time: 1015-1101 OT Time Calculation (min): 46 min Charges:  OT General Charges $OT Visit: 1 Visit OT Evaluation $OT Eval Moderate Complexity: 1 Mod OT Treatments $Self Care/Home Management : 8-22 mins  Joshua Silvano Dragon 11/23/2023, 11:22 AM

## 2023-11-23 NOTE — TOC Transition Note (Signed)
 Transition of Care [facility]) - Discharge Note   Patient Details  Name: Steve Brown MRN: [id number] Date of Birth: 04/09/1940  [facility] CM/SW Contact:  Almarie CHRISTELLA Goodie, LCSW Phone Number: 11/23/2023, 4:20 PM   Clinical Narrative:   CSW spoke with patient's daughter, [name], about OT recommendation for SNF vs 24/7 supervision at home. [name] doesn't think the patient needs SNF, will stay with him at discharge for additional supervision until he recovers. [name] has put in for leave of absence from work, waiting on approval, but also works from home until she hears back. [name] in agreement with discharge home today, if stable; message sent to MD, will work on discharge. CSW updated [facility] that patient will discharge home today, they will follow. No further inpatient care management needs at this time.    Final next level of care: Home w Home Health Services Barriers to Discharge: Barriers Resolved   Patient Goals and CMS Choice            Discharge Placement                       Discharge Plan and Services Additional resources added to the After Visit Summary for                            [facility] Arranged: PT, OT [facility] Agency: [facility] Date [facility] Agency Contacted: 11/23/23   Representative spoke with at [facility] Agency: Arna  Social Drivers of Health (SDOH) Interventions SDOH Screenings   Food Insecurity: No Food Insecurity (11/21/2023)  Housing: Low Risk  (11/21/2023)  Transportation Needs: No Transportation Needs (11/21/2023)  Utilities: Not At Risk (11/21/2023)  Depression (PHQ2-9): Low Risk  (09/09/2019)  Recent Concern: Depression (PHQ2-9) - Medium Risk (08/01/2019)  Financial Resource Strain: Low Risk  (02/10/2019)  Physical Activity: Inactive (09/09/2019)  Social Connections: Moderately Integrated (11/21/2023)  Tobacco Use: Low Risk  (11/21/2023)     Readmission Risk Interventions     No data to display

## 2023-11-23 NOTE — TOC Progression Note (Signed)
 Transition of Care [facility]) - Progression Note    Patient Details  Name: Steve Brown MRN: [id number] Date of Birth: 06/22/1940  [facility]) CM/SW Contact  Andrez JULIANNA George, RN Phone Number: 11/23/2023, 2:33 PM  Clinical Narrative:     Pt is set up with [facility] for home therapy. OT now recommending SNF. LCSW to work up.  IP Care management following.  Expected Discharge Plan:  (TBD) Barriers to Discharge: Continued Medical Work up               Expected Discharge Plan and Services       Living arrangements for the past 2 months: Apartment                                       Social Drivers of Health (SDOH) Interventions SDOH Screenings   Food Insecurity: No Food Insecurity (11/21/2023)  Housing: Low Risk  (11/21/2023)  Transportation Needs: No Transportation Needs (11/21/2023)  Utilities: Not At Risk (11/21/2023)  Depression (PHQ2-9): Low Risk  (09/09/2019)  Recent Concern: Depression (PHQ2-9) - Medium Risk (08/01/2019)  Financial Resource Strain: Low Risk  (02/10/2019)  Physical Activity: Inactive (09/09/2019)  Social Connections: Moderately Integrated (11/21/2023)  Tobacco Use: Low Risk  (11/21/2023)    Readmission Risk Interventions     No data to display

## 2023-11-23 NOTE — Progress Notes (Signed)
 PROGRESS NOTE  Ander Wamser FMW:[id number] DOB: 07/09/1940 DOA: 11/21/2023 PCP: [facility]   LOS: 1 day   Brief narrative:  Steve Brown is a 83 y.o. male with past medical history  of CVA, cervical stenosis, worsening dementia, coronary artery disease, type 2 diabetes, chronic diastolic HF, anxiety and depression, BPH, prostate cancer s/p TURB and seeding, lumbar radiculopathy, s/p neck fusion, chronic back pain, hereditary and idiopathic neuropathy, OSA, history of pulmonary embolism, chronic sinusitis, CKD 3A  presented to [facility] ED for evaluation of hyperglycemia and headache.  History of previous visits to the ED this month and was admitted  on 09/28/23 for possible TIA.  Stroke was ruled out.  In the ED, vitals were stable.  Labs showed hyperglycemia at 564 and patient had creatinine elevation at 2.0.  CBC within normal limits.  EKG showed normal sinus rhythm.  Patient received normal saline bolus and was admitted hospital for further evaluation and treatment.   Assessment/Plan: Principal Problem:   Headache, temporal  Hypotension.  History of hypertension,   Received IV fluid hydration with 1 L of normal saline bolus..  Continue to hold Lasix  and Cozaar .  Latest blood pressure of 140/77  Right-sided headache.  Patient had temporal artery biopsy in April 2025 which was negative for arteritis.  Impression was sinusitis.  Empirically on antibiotic for sinusitis.  Continue supportive care.  Complains of mild pain today  Left shoulder pain.  MRI of the shoulder showed supraspinatus and subscapularis tendinopathy.  Patient stated that he was off anticoagulation for at least 2 weeks now in preparation for possible shoulder joint injection which he is not quite sure when it is  happening.  Diabetes mellitus type 2 with hyperglycemia.  Continue Tresiba , sliding scale insulin  and diabetic diet.  Likely patient is noncompliant with medications.  Lives by himself at home.  Has some  cognitive dysfunction.  Reasonably controlled at this time after resumption of regimen  History of coronary artery disease/elevated troponin.   Cardiology was verbally reached out by ED.  Troponins elevated but flat so cardiology did not feel that it was necessary for the patient to be seen..  Does not appear to be ACS.  Patient's denies active chest pain or dyspnea.  Continue aspirin  and Eliquis .   2D echocardiogram from 11/22/2023 showed LV ejection fraction of 50 to 55% with no regional wall motion abnormality and grade 1 diastolic dysfunction.  History of stroke CVA and pulmonary embolism on Eliquis .  Patient was noncompliant with Eliquis  for the last 2 weeks and now has a new lower extremity DVT.  Lower extremity edema, left lower extremity DVT.  Trace.  Lower extremity venous duplex ultrasound shows evidence of DVT in the left proximal profunda vein.  Patient is supposed to be on Eliquis  but had not been taking for the last 2 weeks.   Acute kidney injury on CKD stage IIIa.  Continue hydration.  Check BMP.  Creatinine today at 1.9 from 2.2 < 2.0.  Baseline creatinine between 1.7-1.9.  BPH.  Continue Flomax /finasteride .  Chronic diastolic heart failure .  Continue to hold Lasix  today..  Review of 2D echocardiogram from 11/22/2023 showed LV ejection fraction of 50 to 55% with no regional wall motion abnormality.  Will discontinue IV fluids.  Lactic acidosis.  Likely secondary to volume depletion.  Received IV fluid hydration with normalized lactate at 1.9.  Still on IV fluids will discontinue at this time  History of cognitive impairment, possible early dementia. No behavioral disturbance.  Continue  Aricept BuSpar  and Lexapro    DVT prophylaxis:  apixaban  (ELIQUIS ) tablet 5 mg   Disposition:   Likely home with home health in 1 to 2 days/ALF.  Patient lives by himself at home.  TOC has been consulted.  Some safety concern by being alone at home due to memory issues with recurrent ED  visits. Status is: Inpatient  The patient is inpatient because: Pending clinical improvement    Code Status:     Code Status: Full Code  Family Communication:  Spoke with the patient's daughter on the phone on 11/22/2023  Consultants: Verbal consult with cardiology  Procedures: None  Anti-infectives:  Augmentin   Anti-infectives (From admission, onward)    Start     Dose/Rate Route Frequency Ordered Stop   11/22/23 2200  amoxicillin -clavulanate (AUGMENTIN ) 500-125 MG per tablet 1 tablet        1 tablet Oral 2 times daily 11/22/23 1228 11/26/23 2159   11/22/23 1230  valACYclovir  (VALTREX ) tablet 500 mg        500 mg Oral Daily 11/22/23 1138     11/21/23 2000  ampicillin-sulbactam (UNASYN) 1.5 g in sodium chloride  0.9 % 100 mL IVPB  Status:  Discontinued        1.5 g 200 mL/hr over 30 Minutes Intravenous Every 12 hours 11/21/23 1945 11/22/23 1228       Subjective: Today, patient was seen and examined at bedside.  Patient more alert awake but has some cognitive dysfunction and memory issues.  Denies any nausea vomiting fever chills or rigor.   Objective: Vitals:   11/23/23 0817 11/23/23 1110  BP: 126/69 (!) 140/77  Pulse: 65 65  Resp: 18 20  Temp: 98.4 F (36.9 C) 98 F (36.7 C)  SpO2: 100% 100%    Intake/Output Summary (Last 24 hours) at 11/23/2023 1132 Last data filed at 11/23/2023 0900 Gross per 24 hour  Intake 680 ml  Output 475 ml  Net 205 ml   Filed Weights   11/23/23 0413  Weight: 77.3 kg   Body mass index is 28.36 kg/m.   Physical Exam:  GENERAL: Patient is alert awake and oriented. Not in obvious distress.  Elderly male HENT: No scleral pallor or icterus. Pupils equally reactive to light. Oral mucosa is moist, tenderness over the sinuses NECK: is supple, no gross swelling noted. CHEST: Diminished breath sounds bilaterally CVS: S1 and S2 heard, no murmur. Regular rate and rhythm.  ABDOMEN: Soft, non-tender, bowel sounds are  present. EXTREMITIES: Bilateral lower extremity trace edema CNS: Cranial nerves are intact.  Moves all extremity SKIN: warm and dry without rashes.  Data Review: I have personally reviewed the following laboratory data and studies,  CBC: Recent Labs  Lab 11/18/23 1440 11/21/23 1625 11/22/23 0124 11/22/23 0419 11/23/23 0431  WBC 5.7 6.2  --  7.0 5.7  NEUTROABS  --  4.3  --   --   --   HGB 13.8 12.6* 11.5* 11.2* 10.9*  HCT 41.6 36.8* 32.3* 32.3* 31.0*  MCV 94.5 92.0  --  91.0 90.9  PLT 178 148*  --  134* 120*   Basic Metabolic Panel: Recent Labs  Lab 11/18/23 1440 11/21/23 1625 11/21/23 2010 11/22/23 0419 11/23/23 0431  NA 137 132*  --  136 139  K 4.6 4.8  --  3.7 3.5  CL 99 92*  --  101 104  CO2 23 24  --  23 24  GLUCOSE 353* 564*  --  173* 98  BUN 29* 39*  --  43* 40*  CREATININE 1.76* 2.05*  --  2.26* 1.93*  CALCIUM  9.2 8.6*  --  8.3* 8.1*  MG  --   --  2.0  --  2.0  PHOS  --   --   --   --  3.0   Liver Function Tests: Recent Labs  Lab 11/18/23 1440 11/22/23 0419  AST 25 35  ALT 18 17  ALKPHOS 69 48  BILITOT 1.6* 1.0  PROT 7.2 5.4*  ALBUMIN 3.3* 2.7*   No results for input(s): LIPASE, AMYLASE in the last 168 hours. No results for input(s): AMMONIA in the last 168 hours. Cardiac Enzymes: No results for input(s): CKTOTAL, CKMB, CKMBINDEX, TROPONINI in the last 168 hours. BNP (last 3 results) Recent Labs    11/21/23 2010  BNP 541.3*    ProBNP (last 3 results) No results for input(s): PROBNP in the last 8760 hours.  CBG: Recent Labs  Lab 11/22/23 1558 11/22/23 1952 11/22/23 2325 11/23/23 0332 11/23/23 0858  GLUCAP 172* 294* 213* 121* 119*   Recent Results (from the past 240 hours)  Blood culture (routine x 2)     Status: None (Preliminary result)   Collection Time: 11/21/23  7:51 PM   Specimen: BLOOD  Result Value Ref Range Status   Specimen Description BLOOD RIGHT ANTECUBITAL  Final   Special Requests   Final    BOTTLES  DRAWN AEROBIC AND ANAEROBIC Blood Culture adequate volume   Culture   Final    NO GROWTH 2 DAYS Performed at [facility], [address], [place], [place] [address]    Report Status PENDING  Incomplete  Blood culture (routine x 2)     Status: Abnormal (Preliminary result)   Collection Time: 11/21/23  8:12 PM   Specimen: BLOOD  Result Value Ref Range Status   Specimen Description BLOOD SITE NOT SPECIFIED  Final   Special Requests   Final    BOTTLES DRAWN AEROBIC AND ANAEROBIC Blood Culture adequate volume   Culture  Setup Time   Final    GRAM POSITIVE COCCI IN PAIRS IN CLUSTERS AEROBIC BOTTLE ONLY CRITICAL RESULT CALLED TO, READ BACK BY AND VERIFIED WITH: PHARMD E. REOME [id number] @ 2018 [facility]    Culture (A)  Final    STAPHYLOCOCCUS HOMINIS THE SIGNIFICANCE OF ISOLATING THIS ORGANISM FROM A SINGLE SET OF BLOOD CULTURES WHEN MULTIPLE SETS ARE DRAWN IS UNCERTAIN. PLEASE NOTIFY THE MICROBIOLOGY DEPARTMENT WITHIN ONE WEEK IF SPECIATION AND SENSITIVITIES ARE REQUIRED. Performed at [facility], [address] [place], [place] [address]    Report Status PENDING  Incomplete  Blood Culture ID Panel (Reflexed)     Status: Abnormal   Collection Time: 11/21/23  8:12 PM  Result Value Ref Range Status   Enterococcus faecalis NOT DETECTED NOT DETECTED Final   Enterococcus Faecium NOT DETECTED NOT DETECTED Final   Listeria monocytogenes NOT DETECTED NOT DETECTED Final   Staphylococcus species DETECTED (A) NOT DETECTED Final    Comment: CRITICAL RESULT CALLED TO, READ BACK BY AND VERIFIED WITH: PHARMD E. REOME [id number] @ 2018 FH    Staphylococcus aureus (BCID) NOT DETECTED NOT DETECTED Final   Staphylococcus epidermidis NOT DETECTED NOT DETECTED Final   Staphylococcus lugdunensis NOT DETECTED NOT DETECTED Final   Streptococcus species NOT DETECTED NOT DETECTED Final   Streptococcus agalactiae NOT DETECTED NOT DETECTED Final   Streptococcus pneumoniae NOT DETECTED NOT DETECTED Final    Streptococcus pyogenes NOT DETECTED NOT DETECTED Final   A.calcoaceticus-baumannii  NOT DETECTED NOT DETECTED Final   Bacteroides fragilis NOT DETECTED NOT DETECTED Final   Enterobacterales NOT DETECTED NOT DETECTED Final   Enterobacter cloacae complex NOT DETECTED NOT DETECTED Final   Escherichia coli NOT DETECTED NOT DETECTED Final   Klebsiella aerogenes NOT DETECTED NOT DETECTED Final   Klebsiella oxytoca NOT DETECTED NOT DETECTED Final   Klebsiella pneumoniae NOT DETECTED NOT DETECTED Final   Proteus species NOT DETECTED NOT DETECTED Final   Salmonella species NOT DETECTED NOT DETECTED Final   Serratia marcescens NOT DETECTED NOT DETECTED Final   Haemophilus influenzae NOT DETECTED NOT DETECTED Final   Neisseria meningitidis NOT DETECTED NOT DETECTED Final   Pseudomonas aeruginosa NOT DETECTED NOT DETECTED Final   Stenotrophomonas maltophilia NOT DETECTED NOT DETECTED Final   Candida albicans NOT DETECTED NOT DETECTED Final   Candida auris NOT DETECTED NOT DETECTED Final   Candida glabrata NOT DETECTED NOT DETECTED Final   Candida krusei NOT DETECTED NOT DETECTED Final   Candida parapsilosis NOT DETECTED NOT DETECTED Final   Candida tropicalis NOT DETECTED NOT DETECTED Final   Cryptococcus neoformans/gattii NOT DETECTED NOT DETECTED Final    Comment: Performed at [facility], [address], [place], [place] [address]     Studies: ECHOCARDIOGRAM COMPLETE Result Date: 11/22/2023    ECHOCARDIOGRAM REPORT   Patient Name:   INRI SOBIESKI Date of Exam: 11/22/2023 Medical Rec #:  [id number]        Height:       65.0 in Accession #:    [id number]       Weight:       185.0 lb Date of Birth:  January 29, 1940         BSA:          1.914 m Patient Age:    83 years         BP:           120/62 mmHg Patient Gender: M                HR:           63 bpm. Exam Location:  Inpatient Procedure: 2D Echo, Color Doppler and Cardiac Doppler (Both Spectral and Color            Flow Doppler were utilized  during procedure). Indications:    Elevated Troponin  History:        Patient has prior history of Echocardiogram examinations, most                 recent 08/07/2022. CAD, TIA and Stroke, Signs/Symptoms:Syncope,                 Hypotension, Shortness of Breath, Chest Pain and                 Dizziness/Lightheadedness; Risk Factors:Diabetes, Dyslipidemia,                 Hypertension and Sleep Apnea.  Sonographer:    Juliene Rucks Referring Phys: [id number] [facility] Jordani Nunn  Sonographer Comments: Patient is obese. IMPRESSIONS  1. Left ventricular ejection fraction, by estimation, is 50 to 55%. The left ventricle has low normal function. The left ventricle has no regional wall motion abnormalities. There is moderate concentric left ventricular hypertrophy. Left ventricular diastolic parameters are consistent with Grade I diastolic dysfunction (impaired relaxation).  2. Right ventricular systolic function is normal. The right ventricular size is normal.  3. The mitral valve is normal in structure. No evidence of  mitral valve regurgitation. No evidence of mitral stenosis.  4. The aortic valve is normal in structure. Aortic valve regurgitation is trivial. Aortic valve sclerosis is present, with no evidence of aortic valve stenosis.  5. The inferior vena cava is normal in size with greater than 50% respiratory variability, suggesting right atrial pressure of 3 mmHg. FINDINGS  Left Ventricle: Left ventricular ejection fraction, by estimation, is 50 to 55%. The left ventricle has low normal function. The left ventricle has no regional wall motion abnormalities. The left ventricular internal cavity size was normal in size. There is moderate concentric left ventricular hypertrophy. Left ventricular diastolic parameters are consistent with Grade I diastolic dysfunction (impaired relaxation). Right Ventricle: The right ventricular size is normal. No increase in right ventricular wall thickness. Right ventricular systolic function is  normal. Left Atrium: Left atrial size was normal in size. Right Atrium: Right atrial size was normal in size. Pericardium: There is no evidence of pericardial effusion. Mitral Valve: The mitral valve is normal in structure. No evidence of mitral valve regurgitation. No evidence of mitral valve stenosis. Tricuspid Valve: The tricuspid valve is normal in structure. Tricuspid valve regurgitation is mild . No evidence of tricuspid stenosis. Aortic Valve: The aortic valve is normal in structure. Aortic valve regurgitation is trivial. Aortic valve sclerosis is present, with no evidence of aortic valve stenosis. Pulmonic Valve: The pulmonic valve was not well visualized. Pulmonic valve regurgitation is trivial. No evidence of pulmonic stenosis. Aorta: The aortic root is normal in size and structure. Venous: The inferior vena cava is normal in size with greater than 50% respiratory variability, suggesting right atrial pressure of 3 mmHg. IAS/Shunts: No atrial level shunt detected by color flow Doppler.  LEFT VENTRICLE PLAX 2D LVIDd:         3.50 cm   Diastology LVIDs:         2.60 cm   LV e' medial:    3.37 cm/s LV PW:         1.30 cm   LV E/e' medial:  16.1 LV IVS:        1.40 cm   LV e' lateral:   3.70 cm/s LVOT diam:     2.00 cm   LV E/e' lateral: 14.6 LV SV:         66 LV SV Index:   35 LVOT Area:     3.14 cm  RIGHT VENTRICLE          IVC RV Basal diam:  2.80 cm  IVC diam: 1.30 cm RV Mid diam:    2.30 cm LEFT ATRIUM           Index        RIGHT ATRIUM           Index LA diam:      3.20 cm 1.67 cm/m   RA Area:     14.10 cm LA Vol (A2C): 54.2 ml 28.32 ml/m  RA Volume:   31.00 ml  16.20 ml/m LA Vol (A4C): 53.4 ml 27.91 ml/m  AORTIC VALVE LVOT Vmax:   101.00 cm/s LVOT Vmean:  64.900 cm/s LVOT VTI:    0.211 m  AORTA Ao Root diam: 3.50 cm MITRAL VALVE               TRICUSPID VALVE MV Area (PHT): 3.42 cm    TR Peak grad:   30.9 mmHg MV Decel Time: 222 msec    TR Vmax:        278.00  cm/s MV E velocity: 54.20 cm/s MV A  velocity: 87.40 cm/s  SHUNTS MV E/A ratio:  0.62        Systemic VTI:  0.21 m                            Systemic Diam: 2.00 cm Kardie Tobb DO Electronically signed by Dub Huntsman DO Signature Date/Time: 11/22/2023/12:53:01 PM    Final    VAS US  LOWER EXTREMITY VENOUS (DVT) Result Date: 11/22/2023  Lower Venous DVT Study Patient Name:  TEDDY REBSTOCK  Date of Exam:   11/22/2023 Medical Rec #: [id number]         Accession #:    [id number] Date of Birth: 04/24/1940          Patient Gender: M Patient Age:   65 years Exam Location:  [facility] Procedure:      VAS US  LOWER EXTREMITY VENOUS (DVT) Referring Phys: EKTA PATEL --------------------------------------------------------------------------------  Indications: Edema.  Risk Factors: None identified. Comparison Study: No prior studies. Performing Technologist: Cordella Collet RVT  Examination Guidelines: A complete evaluation includes B-mode imaging, spectral Doppler, color Doppler, and power Doppler as needed of all accessible portions of each vessel. Bilateral testing is considered an integral part of a complete examination. Limited examinations for reoccurring indications may be performed as noted. The reflux portion of the exam is performed with the patient in reverse Trendelenburg.  +---------+---------------+---------+-----------+----------+--------------+ RIGHT    CompressibilityPhasicitySpontaneityPropertiesThrombus Aging +---------+---------------+---------+-----------+----------+--------------+ CFV      Full           Yes      Yes                                 +---------+---------------+---------+-----------+----------+--------------+ SFJ      Full                                                        +---------+---------------+---------+-----------+----------+--------------+ FV Prox  Full                                                         +---------+---------------+---------+-----------+----------+--------------+ FV Mid   Full                                                        +---------+---------------+---------+-----------+----------+--------------+ FV DistalFull                                                        +---------+---------------+---------+-----------+----------+--------------+ PFV      Full                                                        +---------+---------------+---------+-----------+----------+--------------+  POP      Full           Yes      Yes                                 +---------+---------------+---------+-----------+----------+--------------+ PTV      Full                                                        +---------+---------------+---------+-----------+----------+--------------+ PERO     Full                                                        +---------+---------------+---------+-----------+----------+--------------+   +---------+---------------+---------+-----------+----------+--------------+ LEFT     CompressibilityPhasicitySpontaneityPropertiesThrombus Aging +---------+---------------+---------+-----------+----------+--------------+ CFV      Full           Yes      Yes                                 +---------+---------------+---------+-----------+----------+--------------+ SFJ      Full                                                        +---------+---------------+---------+-----------+----------+--------------+ FV Prox  Full                                                        +---------+---------------+---------+-----------+----------+--------------+ FV Mid   Full                                                        +---------+---------------+---------+-----------+----------+--------------+ FV DistalFull                                                         +---------+---------------+---------+-----------+----------+--------------+ PFV      None           No       No                   Acute          +---------+---------------+---------+-----------+----------+--------------+ POP      Full           Yes      Yes                                 +---------+---------------+---------+-----------+----------+--------------+  PTV      Full                                                        +---------+---------------+---------+-----------+----------+--------------+ PERO     Full                                                        +---------+---------------+---------+-----------+----------+--------------+     Summary: RIGHT: - There is no evidence of deep vein thrombosis in the lower extremity.  - No cystic structure found in the popliteal fossa.  LEFT: - Findings consistent with acute deep vein thrombosis involving the left proximal profunda vein.  - No cystic structure found in the popliteal fossa.  *See table(s) above for measurements and observations. Electronically signed by Debby Robertson on 11/22/2023 at 12:08:46 PM.    Final    MR SHOULDER LEFT WO CONTRAST Result Date: 11/22/2023 CLINICAL DATA:  Left shoulder pain EXAM: MRI OF THE LEFT SHOULDER WITHOUT CONTRAST TECHNIQUE: Multiplanar, multisequence MR imaging of the shoulder was performed. No intravenous contrast was administered. COMPARISON:  None Available. FINDINGS: Rotator cuff:  Mild supraspinatus and subscapularis tendinopathy. Muscles: Diffuse moderate regional muscular atrophy although with relative sparing of the long head of the triceps and the trapezius muscle. Biceps long head: Moderate tendinopathy of the intra-articular segment. Acromioclavicular Joint: Mild degenerative AC joint spurring. Type II acromion. Physiologic fluid in the subacromial subdeltoid bursa. Glenohumeral Joint: Moderate degenerative glenohumeral arthropathy with chondral thinning and associated  spurring. Small multilobulated concentration of fluid distally along the biceps recess of the joint without overt joint effusion. Labrum:  Unremarkable Bones: No significant extra-articular osseous abnormalities identified. Other: No supplemental non-categorized findings. IMPRESSION: 1. Mild supraspinatus and subscapularis tendinopathy. 2. Moderate tendinopathy of the intra-articular segment of the long head of the biceps. 3. Moderate degenerative glenohumeral arthropathy. 4. Mild degenerative AC joint spurring. 5. Diffuse moderate regional muscular atrophy although with relative sparing of the long head of the triceps and the trapezius muscle. Electronically Signed   By: Ryan Salvage M.D.   On: 11/22/2023 08:29   DG Chest 1 View Result Date: 11/21/2023 EXAM: 1 VIEW(S) XRAY OF THE CHEST 11/21/2023 04:13:00 PM COMPARISON: 02/05/2023 CLINICAL HISTORY: Chest pain FINDINGS: LUNGS AND PLEURA: Low lung volumes with bronchovascular crowding. No focal airspace consolidation. No pleural effusion. No pneumothorax. HEART AND MEDIASTINUM: Mild cardiomegaly. Aortic atherosclerosis. BONES AND SOFT TISSUES: Lower cervical fusion hardware. Cholecystectomy clips. Multilevel thoracic osteophytosis. No acute osseous abnormality. IMPRESSION: 1. Lower lung volumes. No acute cardiopulmonary abnormality. Electronically signed by: Rogelia Myers MD 11/21/2023 05:08 PM EST RP Workstation: [id number]   DG Shoulder Left Result Date: 11/21/2023 EXAM: 1 VIEW XRAY OF THE LEFT SHOULDER 11/21/2023 04:13:00 PM COMPARISON: None available. CLINICAL HISTORY: Chest pain FINDINGS: BONES AND JOINTS: Osteopenia. Moderate glenohumeral joint osteoarthritis. No acute fracture or dislocation. Mild osteoarthritis of the acromioclavicular joint. SOFT TISSUES: Visualized lung is unremarkable. Aortic atherosclerosis. IMPRESSION: 1. No acute fracture or dislocation. 2. Glenohumeral and AC joint osteoarthritis. Electronically signed by: Rogelia Myers  MD 11/21/2023 05:06 PM EST RP Workstation: [id number]   CT Head Wo Contrast Result Date: 11/21/2023 EXAM: CT HEAD WITHOUT CONTRAST 11/21/2023  03:44:14 PM TECHNIQUE: CT of the head was performed without the administration of intravenous contrast. Automated exposure control, iterative reconstruction, and/or weight based adjustment of the mA/kV was utilized to reduce the radiation dose to as low as reasonably achievable. COMPARISON: 09/28/2023 CLINICAL HISTORY: Headache, increasing frequency or severity FINDINGS: BRAIN AND VENTRICLES: No acute hemorrhage. No evidence of acute infarct. Chronic left cerebellar infarction. Similar appearance of chronic microvascular ischemic changes. Mild age related volume loss. No hydrocephalus. No extra-axial collection. No mass effect or midline shift. Atherosclerotic calcifications within the cavernous internal carotid and vertebral arteries. ORBITS: Bilateral lens replacement. SINUSES: Chronic right sphenoid sinusitis. Additional mucosal thickening in the ethmoid sinuses, left greater than right. SOFT TISSUES AND SKULL: Left frontal scalp lipoma. No skull fracture. IMPRESSION: 1. No acute intracranial abnormality. 2. Chronic left cerebellar infarction. 3. Chronic microvascular ischemic changes with mild age-related volume loss. 4. Chronic right sphenoid sinusitis with additional mucosal thickening in the ethmoid sinuses, left greater than right. Electronically signed by: Donnice Mania MD 11/21/2023 04:45 PM EST RP Workstation: [id number]      Vernal Alstrom, MD  [facility] 11/23/2023  If 7PM-7AM, please contact night-coverage

## 2023-11-23 NOTE — Discharge Summary (Signed)
 Physician Discharge Summary  Steve Brown FMW:[id number] DOB: 1940/07/20 DOA: 11/21/2023  PCP[facility]  Admit date: 11/21/2023 Discharge date: 11/23/2023  Admitted From: Home  Discharge disposition: Home with home health   Recommendations for Outpatient Follow-Up:   Follow up with your primary care provider in one week.  Check CBC, BMP, magnesium in the next visit   Discharge Diagnosis:   Principal Problem:   Headache, temporal   Discharge Condition: Improved.  Diet recommendation:  Carbohydrate-modified.    Wound care: None.  Code status: Full.   History of Present Illness:   Steve Brown is a 83 y.o. male with past medical history  of CVA, cervical stenosis, worsening dementia, coronary artery disease, type 2 diabetes, chronic diastolic HF, anxiety and depression, BPH, prostate cancer s/p TURB and seeding, lumbar radiculopathy, s/p neck fusion, chronic back pain, hereditary and idiopathic neuropathy, OSA, history of pulmonary embolism, chronic sinusitis, CKD 3A  presented to [facility] ED for evaluation of hyperglycemia and headache.  History of previous visits to the ED this month and was admitted  on 09/28/23 for possible TIA.  Stroke was ruled out.  In the ED, vitals were stable.  Labs showed hyperglycemia at 564 and patient had creatinine elevation at 2.0.  CBC within normal limits.  EKG showed normal sinus rhythm.  Patient received normal saline bolus and was admitted hospital for further evaluation and treatment.    Hospital Course:   Following conditions were addressed during hospitalization as listed below,  Hypotension.  History of hypertension,   Received IV fluid hydration with 1 L of normal saline bolus..   Latest blood pressure of 140/77.  Patient will resume antihypertensives on discharge.   Right-sided headache.  Patient had temporal artery biopsy in April 2025 which was negative for arteritis.  Impression was sinusitis.  Empirically on  antibiotic for sinusitis.  Will continue Augmentin  for next 3 days to complete the course.  Left shoulder pain.  MRI of the shoulder showed supraspinatus and subscapularis tendinopathy.  Patient stated that he was off anticoagulation for at least 2 weeks now in preparation for possible shoulder joint injection which he is not quite sure when it is  happening.  Discussed with the patient's daughter about this.  Will defer injection for some time since the patient has a new blood clot.   Diabetes mellitus type 2 with hyperglycemia.  Continue Tresiba , and insulin  regimen at home.  Will need compliance with it.  Patient's daughter will be supervising the patient at this time.    History of coronary artery disease/elevated troponin.   Cardiology was verbally reached out by ED.  Troponins elevated but flat so cardiology did not feel that it was necessary for the patient to be seen..  Does not appear to be ACS.  Patient's denies active chest pain or dyspnea.  Continue aspirin  and Eliquis .   2D echocardiogram from 11/22/2023 showed LV ejection fraction of 50 to 55% with no regional wall motion abnormality and grade 1 diastolic dysfunction.   History of stroke CVA and pulmonary embolism on Eliquis .  Patient was noncompliant with Eliquis  for the last 2 weeks and now has a new lower extremity DVT.   Lower extremity edema, left lower extremity DVT.  Trace.  Lower extremity venous duplex ultrasound shows evidence of DVT in the left proximal profunda vein.  Patient is supposed to be on Eliquis  but had not been taking for the last 2 weeks.    Acute kidney injury on CKD stage  IIIa.  Received hydration.  Creatinine today at 1.9 from 2.2 < 2.0.  Baseline creatinine between 1.7-1.9.  Will resume Lasix  on discharge.   BPH.  Continue Flomax /finasteride .   Chronic diastolic heart failure .   Review of 2D echocardiogram from 11/22/2023 showed LV ejection fraction of 50 to 55% with no regional wall motion abnormality.      Lactic acidosis.  Likely secondary to volume depletion.  Received IV fluid hydration with normalized lactate at 1.9.  Resolved at this time.   History of cognitive impairment, possible early dementia. No behavioral disturbance.  Continue Aricept BuSpar  and Lexapro .  Patient has cognitive dysfunction and likely not compliant with medications.  Have discussed in detail with the patient's daughter who will be staying with the patient for supervision.  Disposition.  At this time, patient is stable for disposition home with home health services.  I spoke with the patient's daughter prior to discharge.  Medical Consultants:   None.  Procedures:    None Subjective:   Today, patient was seen and examined at bedside.Patient more alert awake but has some cognitive dysfunction and memory issues. Denies any nausea vomiting fever chills or rigor.   Discharge Exam:   Vitals:   11/23/23 1110 11/23/23 1534  BP: (!) 140/77 124/83  Pulse: 65 86  Resp: 20 18  Temp: 98 F (36.7 C) 98.2 F (36.8 C)  SpO2: 100% 97%   Vitals:   11/23/23 0413 11/23/23 0817 11/23/23 1110 11/23/23 1534  BP:  126/69 (!) 140/77 124/83  Pulse:  65 65 86  Resp:  18 20 18   Temp:  98.4 F (36.9 C) 98 F (36.7 C) 98.2 F (36.8 C)  TempSrc:  Oral Oral Oral  SpO2:  100% 100% 97%  Weight: 77.3 kg       GENERAL: Patient is alert awake and oriented. Not in obvious distress.  Elderly male HENT: No scleral pallor or icterus. Pupils equally reactive to light. Oral mucosa is moist, NECK: is supple, no gross swelling noted. CHEST: Diminished breath sounds bilaterally CVS: S1 and S2 heard, no murmur. Regular rate and rhythm.  ABDOMEN: Soft, non-tender, bowel sounds are present. EXTREMITIES: Bilateral lower extremity trace edema CNS: Cranial nerves are intact.  Moves all extremity SKIN: warm and dry without rashes.  The results of significant diagnostics from this hospitalization (including imaging, microbiology,  ancillary and laboratory) are listed below for reference.     Diagnostic Studies:   ECHOCARDIOGRAM COMPLETE Result Date: 11/22/2023    ECHOCARDIOGRAM REPORT   Patient Name:   Steve Brown Date of Exam: 11/22/2023 Medical Rec #:  [id number]        Height:       65.0 in Accession #:    [id number]       Weight:       185.0 lb Date of Birth:  12-28-1940         BSA:          1.914 m Patient Age:    83 years         BP:           120/62 mmHg Patient Gender: M                HR:           63 bpm. Exam Location:  Inpatient Procedure: 2D Echo, Color Doppler and Cardiac Doppler (Both Spectral and Color            Flow Doppler were utilized  during procedure). Indications:    Elevated Troponin  History:        Patient has prior history of Echocardiogram examinations, most                 recent 08/07/2022. CAD, TIA and Stroke, Signs/Symptoms:Syncope,                 Hypotension, Shortness of Breath, Chest Pain and                 Dizziness/Lightheadedness; Risk Factors:Diabetes, Dyslipidemia,                 Hypertension and Sleep Apnea.  Sonographer:    Juliene Rucks Referring Phys: [id number] [facility] Obie Silos  Sonographer Comments: Patient is obese. IMPRESSIONS  1. Left ventricular ejection fraction, by estimation, is 50 to 55%. The left ventricle has low normal function. The left ventricle has no regional wall motion abnormalities. There is moderate concentric left ventricular hypertrophy. Left ventricular diastolic parameters are consistent with Grade I diastolic dysfunction (impaired relaxation).  2. Right ventricular systolic function is normal. The right ventricular size is normal.  3. The mitral valve is normal in structure. No evidence of mitral valve regurgitation. No evidence of mitral stenosis.  4. The aortic valve is normal in structure. Aortic valve regurgitation is trivial. Aortic valve sclerosis is present, with no evidence of aortic valve stenosis.  5. The inferior vena cava is normal in size with greater  than 50% respiratory variability, suggesting right atrial pressure of 3 mmHg. FINDINGS  Left Ventricle: Left ventricular ejection fraction, by estimation, is 50 to 55%. The left ventricle has low normal function. The left ventricle has no regional wall motion abnormalities. The left ventricular internal cavity size was normal in size. There is moderate concentric left ventricular hypertrophy. Left ventricular diastolic parameters are consistent with Grade I diastolic dysfunction (impaired relaxation). Right Ventricle: The right ventricular size is normal. No increase in right ventricular wall thickness. Right ventricular systolic function is normal. Left Atrium: Left atrial size was normal in size. Right Atrium: Right atrial size was normal in size. Pericardium: There is no evidence of pericardial effusion. Mitral Valve: The mitral valve is normal in structure. No evidence of mitral valve regurgitation. No evidence of mitral valve stenosis. Tricuspid Valve: The tricuspid valve is normal in structure. Tricuspid valve regurgitation is mild . No evidence of tricuspid stenosis. Aortic Valve: The aortic valve is normal in structure. Aortic valve regurgitation is trivial. Aortic valve sclerosis is present, with no evidence of aortic valve stenosis. Pulmonic Valve: The pulmonic valve was not well visualized. Pulmonic valve regurgitation is trivial. No evidence of pulmonic stenosis. Aorta: The aortic root is normal in size and structure. Venous: The inferior vena cava is normal in size with greater than 50% respiratory variability, suggesting right atrial pressure of 3 mmHg. IAS/Shunts: No atrial level shunt detected by color flow Doppler.  LEFT VENTRICLE PLAX 2D LVIDd:         3.50 cm   Diastology LVIDs:         2.60 cm   LV e' medial:    3.37 cm/s LV PW:         1.30 cm   LV E/e' medial:  16.1 LV IVS:        1.40 cm   LV e' lateral:   3.70 cm/s LVOT diam:     2.00 cm   LV E/e' lateral: 14.6 LV SV:  66 LV SV Index:    35 LVOT Area:     3.14 cm  RIGHT VENTRICLE          IVC RV Basal diam:  2.80 cm  IVC diam: 1.30 cm RV Mid diam:    2.30 cm LEFT ATRIUM           Index        RIGHT ATRIUM           Index LA diam:      3.20 cm 1.67 cm/m   RA Area:     14.10 cm LA Vol (A2C): 54.2 ml 28.32 ml/m  RA Volume:   31.00 ml  16.20 ml/m LA Vol (A4C): 53.4 ml 27.91 ml/m  AORTIC VALVE LVOT Vmax:   101.00 cm/s LVOT Vmean:  64.900 cm/s LVOT VTI:    0.211 m  AORTA Ao Root diam: 3.50 cm MITRAL VALVE               TRICUSPID VALVE MV Area (PHT): 3.42 cm    TR Peak grad:   30.9 mmHg MV Decel Time: 222 msec    TR Vmax:        278.00 cm/s MV E velocity: 54.20 cm/s MV A velocity: 87.40 cm/s  SHUNTS MV E/A ratio:  0.62        Systemic VTI:  0.21 m                            Systemic Diam: 2.00 cm Kardie Tobb DO Electronically signed by Dub Huntsman DO Signature Date/Time: 11/22/2023/12:53:01 PM    Final    VAS US  LOWER EXTREMITY VENOUS (DVT) Result Date: 11/22/2023  Lower Venous DVT Study Patient Name:  Steve Brown  Date of Exam:   11/22/2023 Medical Rec #: [id number]         Accession #:    [id number] Date of Birth: May 22, 1940          Patient Gender: M Patient Age:   38 years Exam Location:  [facility] Procedure:      VAS US  LOWER EXTREMITY VENOUS (DVT) Referring Phys: EKTA PATEL --------------------------------------------------------------------------------  Indications: Edema.  Risk Factors: None identified. Comparison Study: No prior studies. Performing Technologist: Cordella Collet RVT  Examination Guidelines: A complete evaluation includes B-mode imaging, spectral Doppler, color Doppler, and power Doppler as needed of all accessible portions of each vessel. Bilateral testing is considered an integral part of a complete examination. Limited examinations for reoccurring indications may be performed as noted. The reflux portion of the exam is performed with the patient in reverse Trendelenburg.   +---------+---------------+---------+-----------+----------+--------------+ RIGHT    CompressibilityPhasicitySpontaneityPropertiesThrombus Aging +---------+---------------+---------+-----------+----------+--------------+ CFV      Full           Yes      Yes                                 +---------+---------------+---------+-----------+----------+--------------+ SFJ      Full                                                        +---------+---------------+---------+-----------+----------+--------------+ FV Prox  Full                                                        +---------+---------------+---------+-----------+----------+--------------+  FV Mid   Full                                                        +---------+---------------+---------+-----------+----------+--------------+ FV DistalFull                                                        +---------+---------------+---------+-----------+----------+--------------+ PFV      Full                                                        +---------+---------------+---------+-----------+----------+--------------+ POP      Full           Yes      Yes                                 +---------+---------------+---------+-----------+----------+--------------+ PTV      Full                                                        +---------+---------------+---------+-----------+----------+--------------+ PERO     Full                                                        +---------+---------------+---------+-----------+----------+--------------+   +---------+---------------+---------+-----------+----------+--------------+ LEFT     CompressibilityPhasicitySpontaneityPropertiesThrombus Aging +---------+---------------+---------+-----------+----------+--------------+ CFV      Full           Yes      Yes                                  +---------+---------------+---------+-----------+----------+--------------+ SFJ      Full                                                        +---------+---------------+---------+-----------+----------+--------------+ FV Prox  Full                                                        +---------+---------------+---------+-----------+----------+--------------+ FV Mid   Full                                                        +---------+---------------+---------+-----------+----------+--------------+  FV DistalFull                                                        +---------+---------------+---------+-----------+----------+--------------+ PFV      None           No       No                   Acute          +---------+---------------+---------+-----------+----------+--------------+ POP      Full           Yes      Yes                                 +---------+---------------+---------+-----------+----------+--------------+ PTV      Full                                                        +---------+---------------+---------+-----------+----------+--------------+ PERO     Full                                                        +---------+---------------+---------+-----------+----------+--------------+     Summary: RIGHT: - There is no evidence of deep vein thrombosis in the lower extremity.  - No cystic structure found in the popliteal fossa.  LEFT: - Findings consistent with acute deep vein thrombosis involving the left proximal profunda vein.  - No cystic structure found in the popliteal fossa.  *See table(s) above for measurements and observations. Electronically signed by Debby Robertson on 11/22/2023 at 12:08:46 PM.    Final    MR SHOULDER LEFT WO CONTRAST Result Date: 11/22/2023 CLINICAL DATA:  Left shoulder pain EXAM: MRI OF THE LEFT SHOULDER WITHOUT CONTRAST TECHNIQUE: Multiplanar, multisequence MR imaging of the shoulder was performed.  No intravenous contrast was administered. COMPARISON:  None Available. FINDINGS: Rotator cuff:  Mild supraspinatus and subscapularis tendinopathy. Muscles: Diffuse moderate regional muscular atrophy although with relative sparing of the long head of the triceps and the trapezius muscle. Biceps long head: Moderate tendinopathy of the intra-articular segment. Acromioclavicular Joint: Mild degenerative AC joint spurring. Type II acromion. Physiologic fluid in the subacromial subdeltoid bursa. Glenohumeral Joint: Moderate degenerative glenohumeral arthropathy with chondral thinning and associated spurring. Small multilobulated concentration of fluid distally along the biceps recess of the joint without overt joint effusion. Labrum:  Unremarkable Bones: No significant extra-articular osseous abnormalities identified. Other: No supplemental non-categorized findings. IMPRESSION: 1. Mild supraspinatus and subscapularis tendinopathy. 2. Moderate tendinopathy of the intra-articular segment of the long head of the biceps. 3. Moderate degenerative glenohumeral arthropathy. 4. Mild degenerative AC joint spurring. 5. Diffuse moderate regional muscular atrophy although with relative sparing of the long head of the triceps and the trapezius muscle. Electronically Signed   By: Ryan Salvage M.D.   On: 11/22/2023 08:29   DG Chest 1 View Result Date: 11/21/2023 EXAM: 1 VIEW(S) XRAY OF THE CHEST 11/21/2023  04:13:00 PM COMPARISON: 02/05/2023 CLINICAL HISTORY: Chest pain FINDINGS: LUNGS AND PLEURA: Low lung volumes with bronchovascular crowding. No focal airspace consolidation. No pleural effusion. No pneumothorax. HEART AND MEDIASTINUM: Mild cardiomegaly. Aortic atherosclerosis. BONES AND SOFT TISSUES: Lower cervical fusion hardware. Cholecystectomy clips. Multilevel thoracic osteophytosis. No acute osseous abnormality. IMPRESSION: 1. Lower lung volumes. No acute cardiopulmonary abnormality. Electronically signed by: Rogelia Myers MD 11/21/2023 05:08 PM EST RP Workstation: [id number]   DG Shoulder Left Result Date: 11/21/2023 EXAM: 1 VIEW XRAY OF THE LEFT SHOULDER 11/21/2023 04:13:00 PM COMPARISON: None available. CLINICAL HISTORY: Chest pain FINDINGS: BONES AND JOINTS: Osteopenia. Moderate glenohumeral joint osteoarthritis. No acute fracture or dislocation. Mild osteoarthritis of the acromioclavicular joint. SOFT TISSUES: Visualized lung is unremarkable. Aortic atherosclerosis. IMPRESSION: 1. No acute fracture or dislocation. 2. Glenohumeral and AC joint osteoarthritis. Electronically signed by: Rogelia Myers MD 11/21/2023 05:06 PM EST RP Workstation: [id number]   CT Head Wo Contrast Result Date: 11/21/2023 EXAM: CT HEAD WITHOUT CONTRAST 11/21/2023 03:44:14 PM TECHNIQUE: CT of the head was performed without the administration of intravenous contrast. Automated exposure control, iterative reconstruction, and/or weight based adjustment of the mA/kV was utilized to reduce the radiation dose to as low as reasonably achievable. COMPARISON: 09/28/2023 CLINICAL HISTORY: Headache, increasing frequency or severity FINDINGS: BRAIN AND VENTRICLES: No acute hemorrhage. No evidence of acute infarct. Chronic left cerebellar infarction. Similar appearance of chronic microvascular ischemic changes. Mild age related volume loss. No hydrocephalus. No extra-axial collection. No mass effect or midline shift. Atherosclerotic calcifications within the cavernous internal carotid and vertebral arteries. ORBITS: Bilateral lens replacement. SINUSES: Chronic right sphenoid sinusitis. Additional mucosal thickening in the ethmoid sinuses, left greater than right. SOFT TISSUES AND SKULL: Left frontal scalp lipoma. No skull fracture. IMPRESSION: 1. No acute intracranial abnormality. 2. Chronic left cerebellar infarction. 3. Chronic microvascular ischemic changes with mild age-related volume loss. 4. Chronic right sphenoid sinusitis with additional mucosal  thickening in the ethmoid sinuses, left greater than right. Electronically signed by: Donnice Mania MD 11/21/2023 04:45 PM EST RP Workstation: [id number]     Labs:   Basic Metabolic Panel: Recent Labs  Lab 11/18/23 1440 11/21/23 1625 11/21/23 2010 11/22/23 0419 11/23/23 0431  NA 137 132*  --  136 139  K 4.6 4.8  --  3.7 3.5  CL 99 92*  --  101 104  CO2 23 24  --  23 24  GLUCOSE 353* 564*  --  173* 98  BUN 29* 39*  --  43* 40*  CREATININE 1.76* 2.05*  --  2.26* 1.93*  CALCIUM  9.2 8.6*  --  8.3* 8.1*  MG  --   --  2.0  --  2.0  PHOS  --   --   --   --  3.0   GFR Estimated Creatinine Clearance: 28.8 mL/min (A) (by C-G formula based on SCr of 1.93 mg/dL (H)). Liver Function Tests: Recent Labs  Lab 11/18/23 1440 11/22/23 0419  AST 25 35  ALT 18 17  ALKPHOS 69 48  BILITOT 1.6* 1.0  PROT 7.2 5.4*  ALBUMIN 3.3* 2.7*   No results for input(s): LIPASE, AMYLASE in the last 168 hours. No results for input(s): AMMONIA in the last 168 hours. Coagulation profile No results for input(s): INR, PROTIME in the last 168 hours.  CBC: Recent Labs  Lab 11/18/23 1440 11/21/23 1625 11/22/23 0124 11/22/23 0419 11/23/23 0431  WBC 5.7 6.2  --  7.0 5.7  NEUTROABS  --  4.3  --   --   --  HGB 13.8 12.6* 11.5* 11.2* 10.9*  HCT 41.6 36.8* 32.3* 32.3* 31.0*  MCV 94.5 92.0  --  91.0 90.9  PLT 178 148*  --  134* 120*   Cardiac Enzymes: No results for input(s): CKTOTAL, CKMB, CKMBINDEX, TROPONINI in the last 168 hours. BNP: Invalid input(s): POCBNP CBG: Recent Labs  Lab 11/22/23 2325 11/23/23 0332 11/23/23 0858 11/23/23 1150 11/23/23 1617  GLUCAP 213* 121* 119* 244* 338*   D-Dimer No results for input(s): DDIMER in the last 72 hours. Hgb A1c No results for input(s): HGBA1C in the last 72 hours. Lipid Profile No results for input(s): CHOL, HDL, LDLCALC, TRIG, CHOLHDL, LDLDIRECT in the last 72 hours. Thyroid  function studies No results for  input(s): TSH, T4TOTAL, T3FREE, THYROIDAB in the last 72 hours.  Invalid input(s): FREET3 Anemia work up No results for input(s): VITAMINB12, FOLATE, FERRITIN, TIBC, IRON, RETICCTPCT in the last 72 hours. Microbiology Recent Results (from the past 240 hours)  Blood culture (routine x 2)     Status: None (Preliminary result)   Collection Time: 11/21/23  7:51 PM   Specimen: BLOOD  Result Value Ref Range Status   Specimen Description BLOOD RIGHT ANTECUBITAL  Final   Special Requests   Final    BOTTLES DRAWN AEROBIC AND ANAEROBIC Blood Culture adequate volume   Culture   Final    NO GROWTH 2 DAYS Performed at [facility], [address] [place], [place] [address]    Report Status PENDING  Incomplete  Blood culture (routine x 2)     Status: Abnormal (Preliminary result)   Collection Time: 11/21/23  8:12 PM   Specimen: BLOOD  Result Value Ref Range Status   Specimen Description BLOOD SITE NOT SPECIFIED  Final   Special Requests   Final    BOTTLES DRAWN AEROBIC AND ANAEROBIC Blood Culture adequate volume   Culture  Setup Time   Final    GRAM POSITIVE COCCI IN PAIRS IN CLUSTERS AEROBIC BOTTLE ONLY CRITICAL RESULT CALLED TO, READ BACK BY AND VERIFIED WITH: PHARMD E. REOME [id number] @ 2018 [facility]    Culture (A)  Final    STAPHYLOCOCCUS HOMINIS THE SIGNIFICANCE OF ISOLATING THIS ORGANISM FROM A SINGLE SET OF BLOOD CULTURES WHEN MULTIPLE SETS ARE DRAWN IS UNCERTAIN. PLEASE NOTIFY THE MICROBIOLOGY DEPARTMENT WITHIN ONE WEEK IF SPECIATION AND SENSITIVITIES ARE REQUIRED. Performed at [facility], [address] [place], [place] [address]    Report Status PENDING  Incomplete  Blood Culture ID Panel (Reflexed)     Status: Abnormal   Collection Time: 11/21/23  8:12 PM  Result Value Ref Range Status   Enterococcus faecalis NOT DETECTED NOT DETECTED Final   Enterococcus Faecium NOT DETECTED NOT DETECTED Final   Listeria monocytogenes NOT DETECTED NOT DETECTED  Final   Staphylococcus species DETECTED (A) NOT DETECTED Final    Comment: CRITICAL RESULT CALLED TO, READ BACK BY AND VERIFIED WITH: PHARMD E. REOME [id number] @ 2018 [facility]    Staphylococcus aureus (BCID) NOT DETECTED NOT DETECTED Final   Staphylococcus epidermidis NOT DETECTED NOT DETECTED Final   Staphylococcus lugdunensis NOT DETECTED NOT DETECTED Final   Streptococcus species NOT DETECTED NOT DETECTED Final   Streptococcus agalactiae NOT DETECTED NOT DETECTED Final   Streptococcus pneumoniae NOT DETECTED NOT DETECTED Final   Streptococcus pyogenes NOT DETECTED NOT DETECTED Final   A.calcoaceticus-baumannii NOT DETECTED NOT DETECTED Final   Bacteroides fragilis NOT DETECTED NOT DETECTED Final   Enterobacterales NOT DETECTED NOT DETECTED Final  Enterobacter cloacae complex NOT DETECTED NOT DETECTED Final   Escherichia coli NOT DETECTED NOT DETECTED Final   Klebsiella aerogenes NOT DETECTED NOT DETECTED Final   Klebsiella oxytoca NOT DETECTED NOT DETECTED Final   Klebsiella pneumoniae NOT DETECTED NOT DETECTED Final   Proteus species NOT DETECTED NOT DETECTED Final   Salmonella species NOT DETECTED NOT DETECTED Final   Serratia marcescens NOT DETECTED NOT DETECTED Final   Haemophilus influenzae NOT DETECTED NOT DETECTED Final   Neisseria meningitidis NOT DETECTED NOT DETECTED Final   Pseudomonas aeruginosa NOT DETECTED NOT DETECTED Final   Stenotrophomonas maltophilia NOT DETECTED NOT DETECTED Final   Candida albicans NOT DETECTED NOT DETECTED Final   Candida auris NOT DETECTED NOT DETECTED Final   Candida glabrata NOT DETECTED NOT DETECTED Final   Candida krusei NOT DETECTED NOT DETECTED Final   Candida parapsilosis NOT DETECTED NOT DETECTED Final   Candida tropicalis NOT DETECTED NOT DETECTED Final   Cryptococcus neoformans/gattii NOT DETECTED NOT DETECTED Final    Comment: Performed at [facility], [address] [place], [place] [address]     Discharge Instructions:    Discharge Instructions     Diet Carb Modified   Complete by: As directed    Discharge instructions   Complete by: As directed    Follow up with your primary care provider in one week. Check blood work at that time. Seek medical attention for worsening symptoms.  Complete course of antibiotic.  Please make sure that you continue taking your blood thinners and insulin  at home.  Continue physical therapy at home   Increase activity slowly   Complete by: As directed       Allergies as of 11/23/2023       Reactions   Other Other (See Comments)   Per patient- cardiac cath dye-  woke up during procedure hysterical.   Phenergan  [promethazine ] Other (See Comments)   Syncope  Mood changes   Topamax [topiramate] Other (See Comments)   Hallucinations    Ultram  [tramadol ] Other (See Comments)   Greatly increased blood sugar        Medication List     PAUSE taking these medications    cephALEXin  250 MG capsule Wait to take this until your doctor or other care provider tells you to start again. Commonly known as: KEFLEX  Take 250 mg by mouth at bedtime.       TAKE these medications    amoxicillin -clavulanate 500-125 MG tablet Commonly known as: AUGMENTIN  Take 1 tablet by mouth 2 (two) times daily for 3 days.   apixaban  5 MG Tabs tablet Commonly known as: ELIQUIS  Take 5 mg by mouth 2 (two) times daily.   buPROPion 150 MG 24 hr tablet Commonly known as: WELLBUTRIN XL Take 150 mg by mouth daily.   diclofenac  Sodium 1 % Gel Commonly known as: VOLTAREN  Apply 4 g topically 4 (four) times daily. What changed:  when to take this reasons to take this   donepezil 10 MG tablet Commonly known as: ARICEPT Take 10 mg by mouth daily.   escitalopram  20 MG tablet Commonly known as: LEXAPRO  Take 20 mg by mouth in the morning.   finasteride  5 MG tablet Commonly known as: PROSCAR  Take 5 mg by mouth daily.   fluticasone  50 MCG/ACT nasal spray Commonly known as:  FLONASE  Place 2 sprays into both nostrils daily as needed for allergies or rhinitis.   furosemide  20 MG tablet Commonly known as: LASIX  Take 1-2 tablets (20-40 mg total)  by mouth daily as needed for fluid.   HYDROcodone -acetaminophen  5-325 MG tablet Commonly known as: NORCO/VICODIN Take 1 tablet by mouth every 6 (six) hours as needed for severe pain (pain score 7-10).   insulin  aspart 100 UNIT/ML FlexPen Commonly known as: NOVOLOG  Inject 5 Units into the skin 3 (three) times daily with meals. What changed:  how much to take when to take this   ketoconazole  2 % cream Commonly known as: NIZORAL  Apply 1 Application topically 2 (two) times daily as needed (skin irritation).   lidocaine  5 % ointment Commonly known as: XYLOCAINE  Apply 1 Application topically every 4 (four) hours as needed (pain). What changed: Another medication with the same name was changed. Make sure you understand how and when to take each.   lidocaine  5 % Commonly known as: Lidoderm  Place 1 patch onto the skin daily. Remove & Discard patch within 12 hours or as directed by MD What changed:  when to take this reasons to take this additional instructions   losartan  50 MG tablet Commonly known as: COZAAR  Take 50 mg by mouth daily.   methocarbamol  500 MG tablet Commonly known as: ROBAXIN  Take 1-2 tablets (500-1,000 mg total) by mouth every 8 (eight) hours as needed for muscle spasms.   mirtazapine 15 MG tablet Commonly known as: REMERON Take 7.5 mg by mouth at bedtime as needed (sleep, anxiety, nightmares).   Multivitamin Men 50+ Tabs Take 1 tablet by mouth daily.   pantoprazole  40 MG tablet Commonly known as: PROTONIX  Take 40 mg by mouth daily.   pregabalin  300 MG capsule Commonly known as: LYRICA  Take 300 mg by mouth 2 (two) times daily.   tamsulosin  0.4 MG Caps capsule Commonly known as: FLOMAX  Take 1 capsule (0.4 mg total) by mouth daily after supper.   Tresiba  FlexTouch 100 UNIT/ML  FlexTouch Pen Generic drug: insulin  degludec Inject 34 Units into the skin daily. What changed: how much to take   valACYclovir  500 MG tablet Commonly known as: VALTREX  Take 500 mg by mouth daily.   VITAMIN D -3 PO Take 1 tablet by mouth daily.        Contact information for follow-up providers     [facility] Follow up in 1 week(s).   Contact information: [address] [facility] [place] [address] [phone]              Contact information for after-discharge care     Home Medical Care     [facility]) .   Service: Home Health Services Contact information: [address] [place]  [address] [phone]                      Time coordinating discharge: 39 minutes  Signed:  Dulcey Riederer  [facility] 11/23/2023, 4:44 PM

## 2023-11-24 LAB — CULTURE, BLOOD (ROUTINE X 2): Special Requests: ADEQUATE

## 2023-11-26 LAB — CULTURE, BLOOD (ROUTINE X 2)
Culture: NO GROWTH
Special Requests: ADEQUATE

## 2023-11-29 DIAGNOSIS — R531 Weakness: Secondary | ICD-10-CM | POA: Diagnosis not present

## 2023-11-29 DIAGNOSIS — Z09 Encounter for follow-up examination after completed treatment for conditions other than malignant neoplasm: Secondary | ICD-10-CM | POA: Diagnosis not present

## 2023-11-29 DIAGNOSIS — R42 Dizziness and giddiness: Secondary | ICD-10-CM | POA: Diagnosis not present

## 2023-11-29 DIAGNOSIS — Z6831 Body mass index (BMI) 31.0-31.9, adult: Secondary | ICD-10-CM | POA: Diagnosis not present

## 2023-11-29 DIAGNOSIS — M549 Dorsalgia, unspecified: Secondary | ICD-10-CM | POA: Diagnosis not present

## 2023-11-29 DIAGNOSIS — R899 Unspecified abnormal finding in specimens from other organs, systems and tissues: Secondary | ICD-10-CM | POA: Diagnosis not present

## 2023-11-30 ENCOUNTER — Telehealth: Payer: Self-pay

## 2023-11-30 NOTE — Progress Notes (Signed)
 Complex Care Management Care Guide Note  11/30/2023 Name: Steve Brown MRN: [id number] DOB: 12-02-1940  Steve Brown is a 83 y.o. year old male who is a primary care patient of [facility] and is actively engaged with the care management team. I reached out to Marsa Molly by phone today to assist with re-scheduling  with the RN Case Manager.  Follow up plan: Unsuccessful telephone outreach attempt made. A HIPAA compliant phone message was left for the patient providing contact information and requesting a return call.  Jeoffrey [place] , RMA     [facility]  [facility], [facility] Guide  Direct Dial: [phone]  Website: [email or web address]

## 2023-12-09 DIAGNOSIS — N183 Chronic kidney disease, stage 3 unspecified: Secondary | ICD-10-CM | POA: Diagnosis not present

## 2023-12-09 DIAGNOSIS — E782 Mixed hyperlipidemia: Secondary | ICD-10-CM | POA: Diagnosis not present

## 2023-12-09 DIAGNOSIS — I10 Essential (primary) hypertension: Secondary | ICD-10-CM | POA: Diagnosis not present

## 2023-12-09 DIAGNOSIS — E1140 Type 2 diabetes mellitus with diabetic neuropathy, unspecified: Secondary | ICD-10-CM | POA: Diagnosis not present

## 2023-12-12 DIAGNOSIS — M5116 Intervertebral disc disorders with radiculopathy, lumbar region: Secondary | ICD-10-CM | POA: Diagnosis not present

## 2023-12-12 DIAGNOSIS — M545 Low back pain, unspecified: Secondary | ICD-10-CM | POA: Diagnosis not present

## 2023-12-12 DIAGNOSIS — M1612 Unilateral primary osteoarthritis, left hip: Secondary | ICD-10-CM | POA: Diagnosis not present

## 2023-12-12 DIAGNOSIS — M25512 Pain in left shoulder: Secondary | ICD-10-CM | POA: Diagnosis not present

## 2023-12-12 DIAGNOSIS — M47816 Spondylosis without myelopathy or radiculopathy, lumbar region: Secondary | ICD-10-CM | POA: Diagnosis not present

## 2023-12-12 DIAGNOSIS — M542 Cervicalgia: Secondary | ICD-10-CM | POA: Diagnosis not present

## 2023-12-12 DIAGNOSIS — M25552 Pain in left hip: Secondary | ICD-10-CM | POA: Diagnosis not present

## 2023-12-12 DIAGNOSIS — M546 Pain in thoracic spine: Secondary | ICD-10-CM | POA: Diagnosis not present

## 2023-12-12 DIAGNOSIS — G8929 Other chronic pain: Secondary | ICD-10-CM | POA: Diagnosis not present

## 2023-12-12 DIAGNOSIS — Z79891 Long term (current) use of opiate analgesic: Secondary | ICD-10-CM | POA: Diagnosis not present

## 2023-12-14 ENCOUNTER — Other Ambulatory Visit: Payer: Self-pay

## 2023-12-14 NOTE — Patient Outreach (Signed)
 Complex Care Management   Visit Note  12/14/2023  Name:  Steve Brown MRN: [id number] DOB: 20-Jan-1940  Situation: Referral received for Complex Care Management related to Diabetes with Complications and orthostatic hypotension I obtained verbal consent from Patient.  Visit completed with Patient  on the phone  Background:   Past Medical History:  Diagnosis Date   Abnormal prostate biopsy    Anticoagulant long-term use    currently xarelto    Anxiety    BPH with elevated PSA    CKD (chronic kidney disease), stage II    Complication of anesthesia    limted neck rom limited use of left arm due to cva   Coronary artery disease    CARDIOLOGIST-  DR DANN--  2010-- PCI w/ stenting midLAD   DDD (degenerative disc disease), lumbar    Degeneration of cervical intervertebral disc    Depression    Diabetes mellitus without complication (HCC)    Type 2   Dyspnea on exertion    GERD (gastroesophageal reflux disease)    Hemiparesis due to cerebral infarction    History of cerebrovascular accident (CVA) with residual deficit 2002 and 2003--  hemiparisis both sides   per MRI  anterior left frontal lobe, left para midline pons, and inferior cerebullam bilaterally infarcts   History of pulmonary embolus (PE)    06-30-2012  extensive bilaterally   History of recurrent TIAs    History of syncope    hx multiple pre-syncope and syncopal episodes due to vasovagal, orthostatic hypotension, dehydration   History of TIAs    several since 2002   Hyperlipidemia    Hypertension    Mild atherosclerosis of carotid artery, bilateral    per last duplex 11-04-2014  bilateral ICA 1--39%   Neuropathy    fingers   OSA on CPAP    uses most nights; followed by dr dohmeier--  sev. osa w/ AHI 65.9   Pneumonia    x 1   Prostate cancer (HCC) dx 2018   Renal insufficiency    S/P coronary artery stent placement 2010   stenting to mid LAD   Sensorineural hearing loss, bilateral 04/01/2021   no hearing  aids   Simple renal cyst    bilaterally   Stroke [facility])    Trigger finger of both hands 11/17/2013   Type 2 diabetes mellitus (HCC) dx 1986   last one A1c 9.2 on 04-26-2016   Unsteady gait    uses straight cane and occasional walker. Hx prior CVA/TIAs;   Vertebral artery occlusion, left    chronic    Assessment: Patient Reported Symptoms:  Cognitive Cognitive Status: Able to follow simple commands, Alert and oriented to person, place, and time, Difficulties with attention and concentration, Normal speech and language skills Cognitive/Intellectual Conditions Management [RPT]: Brain Injury Brain Injury: History of stroke (July 2022, July 2023)   Health Maintenance Behaviors: Annual physical exam, Exercise Health Facilitated by: Pain control, Rest  Neurological Neurological Review of Symptoms: Weakness Neurological Management Strategies: Coping strategies, Medication therapy, Routine screening Neurological Comment: Patient reports he follows with  neurology due to history of strokes  HEENT HEENT Symptoms Reported: No symptoms reported HEENT Management Strategies: Routine screening, Medical device    Cardiovascular Cardiovascular Symptoms Reported: No symptoms reported Does patient have uncontrolled Hypertension?: Yes Is patient checking Blood Pressure at home?: Yes Patient's Recent BP reading at home: Patient reports he does not remember, he does not write it down. He reports his BP monitor sends readings to PCP Cardiovascular  Management Strategies: Exercise, Medication therapy, Routine screening Cardiovascular Comment: Patient reports occasional dizziness and lightheadedness but denies at time of assessment. Note per chart review patient experienced SBP 60's during recent hospitalization.  Respiratory Respiratory Symptoms Reported: No symptoms reported Respiratory Management Strategies: Coping strategies, CPAP, Routine screening  Endocrine Endocrine Symptoms Reported: No symptoms  reported Is patient diabetic?: Yes Is patient checking blood sugars at home?: Yes List most recent blood sugar readings, include date and time of day: Once a day via fingerstick. 157 this morning Endocrine Comment: Note per chart review patient was recently hospitalized with hyperglycemia. Patient reports he sees endocrinology (Dr. Tommas) next week  Gastrointestinal Gastrointestinal Symptoms Reported: No symptoms reported Additional Gastrointestinal Details: Patient reports a good appetite. He reports he is going out to eat about 1-2 times a week. His daughter will occasionally bring him food to eat throughout the course of a few days. Patient reports regular BMs, last BM this morning Gastrointestinal Management Strategies: Diet modification    Genitourinary Genitourinary Symptoms Reported: No symptoms reported Genitourinary Comment: Patient notes that he follows with [facility]  Integumentary Integumentary Symptoms Reported: No symptoms reported    Musculoskeletal Musculoskelatal Symptoms Reviewed: Back pain, Unsteady gait Additional Musculoskeletal Details: Patient reports bad chronic lower back pain. Note per chart review he sees [facility], who performed lumbar spine epidural earlier this week. Patient reports injection did the trick bringing his pain from a 10/10 to a 2/10 at time of assessment. Patient reports he only takes PRN Hydrocodone  when he knows he will be staying home Musculoskeletal Management Strategies: Adequate rest, Coping strategies, Medical device, Medication therapy, Routine screening Musculoskeletal Comment: Patient reports he goes to the [facility] for exercise. Patient confirms he was discharged with home health, but reports no one has contacted him for initial evaluation. Patient reports his balance is a little off right now so he has been using his cane Falls in the past year?: No Number of falls in past year: 1 or less Was there an injury with  Fall?: No Fall Risk Category Calculator: 0 Patient Fall Risk Level: Low Fall Risk Patient at Risk for Falls Due to: Impaired balance/gait Fall risk Follow up: Falls evaluation completed, Education provided, Falls prevention discussed  Psychosocial Psychosocial Symptoms Reported: No symptoms reported Additional Psychological Details: Patient reports he meets with [facility] at least once a month Behavioral Management Strategies: Medication therapy, Support system, Coping strategies, Counseling Major Change/Loss/Stressor/Fears (CP): Denies Techniques to Cope with Loss/Stress/Change: Exercise, Medication, Counseling Quality of Family Relationships: helpful, involved, supportive Do you feel physically threatened by others?: No    12/14/2023    PHQ2-9 Depression Screening   Little interest or pleasure in doing things Not at all  Feeling down, depressed, or hopeless Not at all  PHQ-2 - Total Score 0  Trouble falling or staying asleep, or sleeping too much    Feeling tired or having little energy    Poor appetite or overeating     Feeling bad about yourself - or that you are a failure or have let yourself or your family down    Trouble concentrating on things, such as reading the newspaper or watching television    Moving or speaking so slowly that other people could have noticed.  Or the opposite - being so fidgety or restless that you have been moving around a lot more than usual    Thoughts that you would be better off dead, or hurting yourself in some way  PHQ2-9 Total Score    If you checked off any problems, how difficult have these problems made it for you to do your work, take care of things at home, or get along with other people    Depression Interventions/Treatment      There were no vitals filed for this visit. Pain Scale: 0-10 Pain Score: 2  Pain Type: Chronic pain Pain Location: Back Pain Orientation: Lower  Medications Reviewed Today     Reviewed by  [name] Rosaline SQUIBB, RN (Registered Nurse) on 12/14/23 at 1333  Med List Status: <None>   Medication Order Taking? Sig Documenting Provider Last Dose Status Informant  apixaban  (ELIQUIS ) 5 MG TABS tablet [id number] Yes Take 5 mg by mouth 2 (two) times daily. [provider]  Active Self, Pharmacy Records           Med Note (COFFELL, JON HERO   Thu Nov 22, 2023  9:07 AM) [facility] #180.  buPROPion  (WELLBUTRIN  XL) 150 MG 24 hr tablet [id number] Yes Take 150 mg by mouth daily. [provider]  Active Pharmacy Records, Self           Med Note (COFFELL, JON HERO Schaumann Nov 22, 2023  9:54 AM) [facility]: LF [facility] 11/11/23 #90, 90 DS.  cephALEXin  (KEFLEX ) 250 MG capsule [id number] Yes Take 250 mg by mouth at bedtime. [provider]  Active Pharmacy Records, Self           Med Note (COFFELL, JON HERO Schaumann Nov 22, 2023  9:53 AM) [facility] 10/12/23 #90, 90 DS.  Cholecalciferol  (VITAMIN D -3 PO) [id number] Yes Take 1 tablet by mouth daily. [provider]  Active Pharmacy Records, Self  diclofenac  Sodium (VOLTAREN ) 1 % GEL [id number] Yes Apply 4 g topically 4 (four) times daily.  Patient taking differently: Apply 4 g topically 4 (four) times daily as needed.   Emil Share, DO  Active Self, Pharmacy Records  donepezil  (ARICEPT ) 10 MG tablet [id number] Yes Take 10 mg by mouth daily. [provider]  Active Self, Pharmacy Records           Med Note (COFFELL, JON HERO   Thu Nov 22, 2023  9:08 AM) [facility]: [facility] 09/25/23 #90.  escitalopram  (LEXAPRO ) 20 MG tablet [id number] Yes Take 20 mg by mouth in the morning. [provider]  Active Self, Pharmacy Records           Med Note (COFFELL, JON HERO Schaumann Nov 22, 2023  9:09 AM) [facility]: [facility] 09/26/23 #90.  finasteride  (PROSCAR ) 5 MG tablet [id number] Yes Take 5 mg by mouth daily. [provider]  Active Self, Pharmacy Records  fluticasone  (FLONASE ) 50 MCG/ACT nasal spray [id number] Yes Place 2 sprays into both nostrils daily as  needed for allergies or rhinitis. [provider]  Active Pharmacy Records, Self  furosemide  (LASIX ) 20 MG tablet [id number] Yes Take 1-2 tablets (20-40 mg total) by mouth daily as needed for fluid. Medina-Vargas, Monina C, NP  Active Self, Pharmacy Records  HYDROcodone -acetaminophen  (NORCO/VICODIN) 5-325 MG tablet [id number] Yes Take 1 tablet by mouth every 6 (six) hours as needed for severe pain (pain score 7-10). Odell Balls, PA-C  Active Self, Pharmacy Records  insulin  aspart (NOVOLOG ) 100 UNIT/ML FlexPen [id number] Yes Inject 5 Units into the skin 3 (three) times daily with meals.  Patient taking differently: Inject 5-8 Units into the skin 3 (three) times daily before meals.   Medina-Vargas, Monina C, NP  Active Self,  Pharmacy Records           Med Note (COFFELL, JON HERO   Thu Nov 22, 2023  9:14 AM)    ketoconazole  (NIZORAL ) 2 % cream [id number] Yes Apply 1 Application topically 2 (two) times daily as needed (skin irritation). [provider]  Active Pharmacy Records, Self  lidocaine  (LIDODERM ) 5 % [id number] Yes Place 1 patch onto the skin daily. Remove & Discard patch within 12 hours or as directed by MD  Patient taking differently: Place 1 patch onto the skin daily as needed (pain).   Theotis Peers M, PA-C  Active Self, Pharmacy Records  lidocaine  (XYLOCAINE ) 5 % ointment [id number] Yes Apply 1 Application topically every 4 (four) hours as needed (pain). [provider]  Active Pharmacy Records, Self  losartan  (COZAAR ) 50 MG tablet [id number] Yes Take 50 mg by mouth daily. [provider]  Active Self, Pharmacy Records  methocarbamol  (ROBAXIN ) 500 MG tablet [id number] Yes Take 1-2 tablets (500-1,000 mg total) by mouth every 8 (eight) hours as needed for muscle spasms. Trine Raynell Moder, MD  Active Self, Pharmacy Records  mirtazapine  (REMERON ) 15 MG tablet [id number] Yes Take 7.5 mg by mouth at bedtime as needed (sleep, anxiety, nightmares). [provider]  Active Pharmacy Records, Self           Med Note (COFFELL, JON HERO Schaumann Nov 22, 2023  9:35 AM) [facility]: LF 09/25/23 #15.  Multiple Vitamins-Minerals (MULTIVITAMIN MEN 50+) TABS [id number] Yes Take 1 tablet by mouth daily. [provider]  Active Pharmacy Records, Self  pantoprazole  (PROTONIX ) 40 MG tablet [id number] Yes Take 40 mg by mouth daily. [provider]  Active Pharmacy Records, Self           Med Note (COFFELL, JON HERO Schaumann Nov 22, 2023  9:53 AM) [facility] 10/16/23 #90, 90 DS.  pregabalin  (LYRICA ) 300 MG capsule [id number] Yes Take 300 mg by mouth 2 (two) times daily. [provider]  Active Self, Pharmacy Records  tamsulosin  (FLOMAX ) 0.4 MG CAPS capsule [id number] Yes Take 1 capsule (0.4 mg total) by mouth daily after supper. Von Bellis, MD  Active Self, Pharmacy Records  TRESIBA  [facility] 100 UNIT/ML FlexTouch Pen [id number] Yes Inject 34 Units into the skin daily.  Patient taking differently: Inject 26 Units into the skin daily.   Medina-Vargas, Monina C, NP  Active Self, Pharmacy Records           Med Note (COFFELL, JON HERO   Thu Nov 22, 2023  9:10 AM) [facility]: LF 11/15/23 #10 pens.  valACYclovir  (VALTREX ) 500 MG tablet [id number] Yes Take 500 mg by mouth daily. [provider]  Active Pharmacy Records, Self           Med Note (COFFELL, JON HERO Schaumann Nov 22, 2023  9:53 AM) [facility]:  LF sold 10/16/23 #90, 90 DS.            Recommendation:   Specialty provider follow-up endocrinology next week per patient Continue Current Plan of Care  Follow Up Plan:   Telephone follow up appointment date/time:  12/28/23 at 2:30 PM  Rosaline Finlay, RN MSN [place]  [facility] RN Care Manager Direct Dial: [phone]  Fax: [phone]

## 2023-12-14 NOTE — Patient Outreach (Signed)
 Received call back from Jon, facilities manager at [facility]. Patient had PT visit 12/10/23 and is scheduled again 12/17/23 and 12/24/23. Patient made aware.  Rosaline Finlay, RN MSN [place]  [facility] RN Care Manager Direct Dial: [phone]  Fax: [phone]

## 2023-12-14 NOTE — Patient Instructions (Signed)
 Visit Information  Thank you for taking time to visit with me today. Please don't hesitate to contact me if I can be of assistance to you before our next scheduled appointment.  Our next appointment is by telephone on 12/28/23 at 2:30 PM Please call the care guide team at [phone] if you need to cancel or reschedule your appointment.   Following is a copy of your care plan:   Goals Addressed             This Visit's Progress    [facility] RN Care Plan   On track    Problems:  Chronic Disease Management support and education needs related to DMII and orthostatic hypotension  Goal: Over the next 30 days the Patient will demonstrate Improved adherence to prescribed treatment plan for DMII as evidenced by patient report of home blood sugar readings within goal of 80-130 fasting experience decrease in ED visits as evidenced by electronic medical record review; ED visits in last 6 months = 5 not experience hospital admission as evidenced by review of electronic medical record. Hospital Admissions in last 6 months = 2 Report decreased occurrence/symptoms related to orthostatic hypotension as evidenced by patient report, BP readings 100-130/60-80  Interventions:   Diabetes Interventions: Assessed patient's understanding of A1c goal: <7% Provided education to patient about basic DM disease process Reviewed medications with patient and discussed importance of medication adherence Counseled on importance of regular laboratory monitoring as prescribed Reviewed scheduled/upcoming provider appointments including: Endocrinology next week per patient Advised patient, providing education and rationale, to check cbg fasting daily and record, calling endocrinology for findings outside established parameters Reviewed most recent A1c, what this lab value indicates, and goal A1c of <7% Advised patient of fasting blood sugar goal of 80-130 Ensured patient is taking antidiabetic medications as  ordered Verbal and written education provided regarding diabetic diet using the plate method. Emphasized importance of adequate hydration Lab Results  Component Value Date   HGBA1C 9.4 (H) 09/29/2023    Evaluation of current treatment plan related to orthostatic hypotension self-management and patient's adherence to plan as established by provider. Discussed plans with patient for ongoing care management follow up and provided patient with direct contact information for care management team Evaluation of current treatment plan related to orthostatic hypotension and patient's adherence to plan as established by provider Provided education to patient re: symptoms of orthostatic hypotension and fall prevention strategies Ensured patient has BP monitor and is checking regularly at home. Educated patient on goal BP 100-130/60-80 Message left for [facility], home health agency where referral was sent following hospitalization, to check on status of referral per patient report that [facility] had not made initial visit  Patient Self-Care Activities:  Attend all scheduled provider appointments Call provider office for new concerns or questions  Perform all self care activities independently  Perform IADL's (shopping, preparing meals, housekeeping, managing finances) independently Take medications as prescribed   check blood sugar at prescribed times: once daily check feet daily for cuts, sores or redness take the blood sugar log to all doctor visits drink 6 to 8 glasses of water each day limit fast food meals to no more than 1 per week prepare main meal at home 3 to 5 days each week Continue to monitor BP daily at home  Plan:  Telephone follow up appointment with care management team member scheduled for:  12/28/23 at 2:30 PM             Please call the Suicide and  Crisis Lifeline: 988 call 1-800-273-TALK (toll free, 24 hour hotline) if you are experiencing a Mental Health or Behavioral Health  Crisis or need someone to talk to.  Patient verbalized understanding of Care plan and visit instructions communicated this visit  Rosaline Finlay, RN MSN [place]  [facility] RN Care Manager Direct Dial: [phone]  Fax: [phone]

## 2023-12-17 ENCOUNTER — Emergency Department (HOSPITAL_COMMUNITY)

## 2023-12-17 ENCOUNTER — Other Ambulatory Visit: Payer: Self-pay

## 2023-12-17 ENCOUNTER — Inpatient Hospital Stay (HOSPITAL_COMMUNITY)
Admission: EM | Admit: 2023-12-17 | Discharge: 2023-12-21 | DRG: 194 | Disposition: A | Attending: Family Medicine | Admitting: Family Medicine

## 2023-12-17 ENCOUNTER — Encounter (HOSPITAL_COMMUNITY): Payer: Self-pay

## 2023-12-17 DIAGNOSIS — J189 Pneumonia, unspecified organism: Secondary | ICD-10-CM | POA: Diagnosis present

## 2023-12-17 DIAGNOSIS — R7989 Other specified abnormal findings of blood chemistry: Secondary | ICD-10-CM | POA: Diagnosis not present

## 2023-12-17 DIAGNOSIS — Z823 Family history of stroke: Secondary | ICD-10-CM | POA: Diagnosis not present

## 2023-12-17 DIAGNOSIS — R519 Headache, unspecified: Secondary | ICD-10-CM | POA: Diagnosis not present

## 2023-12-17 DIAGNOSIS — R410 Disorientation, unspecified: Secondary | ICD-10-CM

## 2023-12-17 DIAGNOSIS — I2510 Atherosclerotic heart disease of native coronary artery without angina pectoris: Secondary | ICD-10-CM | POA: Diagnosis present

## 2023-12-17 DIAGNOSIS — N1832 Chronic kidney disease, stage 3b: Secondary | ICD-10-CM | POA: Diagnosis present

## 2023-12-17 DIAGNOSIS — R531 Weakness: Secondary | ICD-10-CM | POA: Diagnosis not present

## 2023-12-17 DIAGNOSIS — Z7901 Long term (current) use of anticoagulants: Secondary | ICD-10-CM | POA: Diagnosis not present

## 2023-12-17 DIAGNOSIS — F32A Depression, unspecified: Secondary | ICD-10-CM | POA: Diagnosis present

## 2023-12-17 DIAGNOSIS — R0602 Shortness of breath: Secondary | ICD-10-CM | POA: Diagnosis not present

## 2023-12-17 DIAGNOSIS — J168 Pneumonia due to other specified infectious organisms: Secondary | ICD-10-CM | POA: Diagnosis not present

## 2023-12-17 DIAGNOSIS — F0393 Unspecified dementia, unspecified severity, with mood disturbance: Secondary | ICD-10-CM | POA: Diagnosis present

## 2023-12-17 DIAGNOSIS — R000 Tachycardia, unspecified: Secondary | ICD-10-CM | POA: Diagnosis not present

## 2023-12-17 DIAGNOSIS — G4733 Obstructive sleep apnea (adult) (pediatric): Secondary | ICD-10-CM | POA: Diagnosis present

## 2023-12-17 DIAGNOSIS — G9340 Encephalopathy, unspecified: Secondary | ICD-10-CM | POA: Diagnosis present

## 2023-12-17 DIAGNOSIS — N179 Acute kidney failure, unspecified: Secondary | ICD-10-CM | POA: Diagnosis present

## 2023-12-17 DIAGNOSIS — Z955 Presence of coronary angioplasty implant and graft: Secondary | ICD-10-CM | POA: Diagnosis not present

## 2023-12-17 DIAGNOSIS — K219 Gastro-esophageal reflux disease without esophagitis: Secondary | ICD-10-CM | POA: Diagnosis present

## 2023-12-17 DIAGNOSIS — I69352 Hemiplegia and hemiparesis following cerebral infarction affecting left dominant side: Secondary | ICD-10-CM | POA: Diagnosis not present

## 2023-12-17 DIAGNOSIS — Z923 Personal history of irradiation: Secondary | ICD-10-CM | POA: Diagnosis not present

## 2023-12-17 DIAGNOSIS — I6782 Cerebral ischemia: Secondary | ICD-10-CM | POA: Diagnosis not present

## 2023-12-17 DIAGNOSIS — I2489 Other forms of acute ischemic heart disease: Secondary | ICD-10-CM | POA: Diagnosis present

## 2023-12-17 DIAGNOSIS — Z79899 Other long term (current) drug therapy: Secondary | ICD-10-CM | POA: Diagnosis not present

## 2023-12-17 DIAGNOSIS — I4891 Unspecified atrial fibrillation: Secondary | ICD-10-CM | POA: Diagnosis present

## 2023-12-17 DIAGNOSIS — I10 Essential (primary) hypertension: Secondary | ICD-10-CM | POA: Diagnosis not present

## 2023-12-17 DIAGNOSIS — N400 Enlarged prostate without lower urinary tract symptoms: Secondary | ICD-10-CM | POA: Diagnosis present

## 2023-12-17 DIAGNOSIS — E785 Hyperlipidemia, unspecified: Secondary | ICD-10-CM | POA: Diagnosis present

## 2023-12-17 DIAGNOSIS — N189 Chronic kidney disease, unspecified: Secondary | ICD-10-CM | POA: Diagnosis not present

## 2023-12-17 DIAGNOSIS — R918 Other nonspecific abnormal finding of lung field: Secondary | ICD-10-CM | POA: Diagnosis not present

## 2023-12-17 DIAGNOSIS — I129 Hypertensive chronic kidney disease with stage 1 through stage 4 chronic kidney disease, or unspecified chronic kidney disease: Secondary | ICD-10-CM | POA: Diagnosis present

## 2023-12-17 DIAGNOSIS — Z1152 Encounter for screening for COVID-19: Secondary | ICD-10-CM | POA: Diagnosis not present

## 2023-12-17 DIAGNOSIS — Z8546 Personal history of malignant neoplasm of prostate: Secondary | ICD-10-CM | POA: Diagnosis not present

## 2023-12-17 DIAGNOSIS — E1122 Type 2 diabetes mellitus with diabetic chronic kidney disease: Secondary | ICD-10-CM | POA: Diagnosis present

## 2023-12-17 DIAGNOSIS — G9349 Other encephalopathy: Secondary | ICD-10-CM | POA: Diagnosis present

## 2023-12-17 DIAGNOSIS — Z794 Long term (current) use of insulin: Secondary | ICD-10-CM | POA: Diagnosis not present

## 2023-12-17 LAB — RESP PANEL BY RT-PCR (RSV, FLU A&B, COVID)  RVPGX2
Influenza A by PCR: NEGATIVE
Influenza B by PCR: NEGATIVE
Resp Syncytial Virus by PCR: NEGATIVE
SARS Coronavirus 2 by RT PCR: NEGATIVE

## 2023-12-17 LAB — URINALYSIS, ROUTINE W REFLEX MICROSCOPIC
Bilirubin Urine: NEGATIVE
Glucose, UA: >=500 mg/dL — AB
Hgb urine dipstick: NEGATIVE
Ketones, ur: 20 mg/dL — AB
Leukocytes,Ua: NEGATIVE
Nitrite: NEGATIVE
Protein, ur: 30 mg/dL — AB
Specific Gravity, Urine: 1.013 (ref 1.005–1.030)
pH: 7 (ref 5.0–8.0)

## 2023-12-17 LAB — CBC
HCT: 35.6 % — ABNORMAL LOW (ref 39.0–52.0)
Hemoglobin: 12.4 g/dL — ABNORMAL LOW (ref 13.0–17.0)
MCH: 31.5 pg (ref 26.0–34.0)
MCHC: 34.8 g/dL (ref 30.0–36.0)
MCV: 90.4 fL (ref 80.0–100.0)
Platelets: 219 K/uL (ref 150–400)
RBC: 3.94 MIL/uL — ABNORMAL LOW (ref 4.22–5.81)
RDW: 13.2 % (ref 11.5–15.5)
WBC: 4.1 K/uL (ref 4.0–10.5)
nRBC: 0 % (ref 0.0–0.2)

## 2023-12-17 LAB — BRAIN NATRIURETIC PEPTIDE: B Natriuretic Peptide: 172.6 pg/mL — ABNORMAL HIGH (ref 0.0–100.0)

## 2023-12-17 LAB — BASIC METABOLIC PANEL WITH GFR
Anion gap: 5 (ref 5–15)
BUN: 18 mg/dL (ref 8–23)
CO2: 30 mmol/L (ref 22–32)
Calcium: 8.8 mg/dL — ABNORMAL LOW (ref 8.9–10.3)
Chloride: 102 mmol/L (ref 98–111)
Creatinine, Ser: 1.54 mg/dL — ABNORMAL HIGH (ref 0.61–1.24)
GFR, Estimated: 45 mL/min — ABNORMAL LOW (ref >60)
Glucose, Bld: 290 mg/dL — ABNORMAL HIGH (ref 70–99)
Potassium: 4.1 mmol/L (ref 3.5–5.1)
Sodium: 137 mmol/L (ref 135–145)

## 2023-12-17 LAB — CBG MONITORING, ED: Glucose-Capillary: 282 mg/dL — ABNORMAL HIGH (ref 70–99)

## 2023-12-17 LAB — TROPONIN I (HIGH SENSITIVITY)
Troponin I (High Sensitivity): 63 ng/L — ABNORMAL HIGH (ref <18)
Troponin I (High Sensitivity): 81 ng/L — ABNORMAL HIGH (ref <18)

## 2023-12-17 MED ORDER — LOSARTAN POTASSIUM 50 MG PO TABS
50.0000 mg | ORAL_TABLET | Freq: Every day | ORAL | Status: DC
  Administered 2023-12-18 (×2): 50 mg via ORAL
  Filled 2023-12-17 (×2): qty 1

## 2023-12-17 MED ORDER — SODIUM CHLORIDE 0.9 % IV SOLN
2.0000 g | Freq: Once | INTRAVENOUS | Status: DC
  Filled 2023-12-17: qty 20

## 2023-12-17 MED ORDER — AZITHROMYCIN 500 MG PO TABS
500.0000 mg | ORAL_TABLET | Freq: Every day | ORAL | Status: AC
  Administered 2023-12-18 – 2023-12-20 (×3): 500 mg via ORAL
  Filled 2023-12-17 (×3): qty 1

## 2023-12-17 MED ORDER — BUPROPION HCL ER (XL) 150 MG PO TB24
150.0000 mg | ORAL_TABLET | Freq: Every day | ORAL | Status: DC
  Administered 2023-12-18 – 2023-12-21 (×4): 150 mg via ORAL
  Filled 2023-12-17 (×4): qty 1

## 2023-12-17 MED ORDER — INSULIN GLARGINE 100 UNIT/ML ~~LOC~~ SOLN
25.0000 [IU] | Freq: Every day | SUBCUTANEOUS | Status: DC
  Administered 2023-12-18 (×2): 25 [IU] via SUBCUTANEOUS
  Filled 2023-12-17 (×3): qty 0.25

## 2023-12-17 MED ORDER — DONEPEZIL HCL 10 MG PO TABS
10.0000 mg | ORAL_TABLET | Freq: Every day | ORAL | Status: DC
  Administered 2023-12-18 – 2023-12-20 (×4): 10 mg via ORAL
  Filled 2023-12-17 (×4): qty 1

## 2023-12-17 MED ORDER — PANTOPRAZOLE SODIUM 40 MG PO TBEC
40.0000 mg | DELAYED_RELEASE_TABLET | Freq: Every day | ORAL | Status: DC
  Administered 2023-12-18 – 2023-12-21 (×4): 40 mg via ORAL
  Filled 2023-12-17 (×5): qty 1

## 2023-12-17 MED ORDER — ROSUVASTATIN CALCIUM 20 MG PO TABS
40.0000 mg | ORAL_TABLET | Freq: Every day | ORAL | Status: DC
  Administered 2023-12-18 – 2023-12-20 (×4): 40 mg via ORAL
  Filled 2023-12-17 (×4): qty 2

## 2023-12-17 MED ORDER — INSULIN ASPART 100 UNIT/ML IJ SOLN
0.0000 [IU] | Freq: Three times a day (TID) | INTRAMUSCULAR | Status: DC
  Administered 2023-12-18: 8 [IU] via SUBCUTANEOUS
  Administered 2023-12-18: 16 [IU] via SUBCUTANEOUS
  Filled 2023-12-17: qty 16
  Filled 2023-12-17: qty 8

## 2023-12-17 MED ORDER — SODIUM CHLORIDE 0.9 % IV SOLN
2.0000 g | INTRAVENOUS | Status: DC
  Administered 2023-12-17 – 2023-12-20 (×4): 2 g via INTRAVENOUS
  Filled 2023-12-17 (×3): qty 20

## 2023-12-17 MED ORDER — FINASTERIDE 5 MG PO TABS
5.0000 mg | ORAL_TABLET | Freq: Every day | ORAL | Status: DC
  Administered 2023-12-18 – 2023-12-21 (×4): 5 mg via ORAL
  Filled 2023-12-17 (×4): qty 1

## 2023-12-17 MED ORDER — ENOXAPARIN SODIUM 40 MG/0.4ML IJ SOSY: 40.0000 mg | PREFILLED_SYRINGE | INTRAMUSCULAR | Status: DC

## 2023-12-17 MED ORDER — SODIUM CHLORIDE 0.9 % IV SOLN
500.0000 mg | Freq: Once | INTRAVENOUS | Status: AC
  Administered 2023-12-17: 500 mg via INTRAVENOUS
  Filled 2023-12-17: qty 5

## 2023-12-17 MED ORDER — TAMSULOSIN HCL 0.4 MG PO CAPS
0.4000 mg | ORAL_CAPSULE | Freq: Every day | ORAL | Status: DC
  Administered 2023-12-19 – 2023-12-20 (×2): 0.4 mg via ORAL
  Filled 2023-12-17 (×2): qty 1

## 2023-12-17 MED ORDER — ACETAMINOPHEN 650 MG RE SUPP: 650.0000 mg | Freq: Four times a day (QID) | RECTAL | Status: DC | PRN

## 2023-12-17 MED ORDER — APIXABAN 5 MG PO TABS
5.0000 mg | ORAL_TABLET | Freq: Two times a day (BID) | ORAL | Status: DC
  Administered 2023-12-18 – 2023-12-21 (×8): 5 mg via ORAL
  Filled 2023-12-17 (×8): qty 1

## 2023-12-17 MED ORDER — OXYCODONE HCL 5 MG PO TABS
5.0000 mg | ORAL_TABLET | ORAL | Status: DC | PRN
  Administered 2023-12-20: 5 mg via ORAL
  Filled 2023-12-17: qty 1

## 2023-12-17 MED ORDER — ONDANSETRON HCL 4 MG PO TABS: 4.0000 mg | ORAL_TABLET | Freq: Four times a day (QID) | ORAL | Status: DC | PRN

## 2023-12-17 MED ORDER — MIRTAZAPINE 15 MG PO TABS: 7.5000 mg | ORAL_TABLET | Freq: Every evening | ORAL | Status: AC | PRN

## 2023-12-17 MED ORDER — ACETAMINOPHEN 325 MG PO TABS: 650.0000 mg | ORAL_TABLET | Freq: Four times a day (QID) | ORAL | Status: DC | PRN

## 2023-12-17 MED ORDER — ESCITALOPRAM OXALATE 10 MG PO TABS
20.0000 mg | ORAL_TABLET | Freq: Every morning | ORAL | Status: DC
  Administered 2023-12-18 – 2023-12-21 (×4): 20 mg via ORAL
  Filled 2023-12-17 (×4): qty 2

## 2023-12-17 MED ORDER — PREGABALIN 100 MG PO CAPS
300.0000 mg | ORAL_CAPSULE | Freq: Two times a day (BID) | ORAL | Status: DC
  Administered 2023-12-18 – 2023-12-19 (×4): 300 mg via ORAL
  Filled 2023-12-17 (×4): qty 3

## 2023-12-17 MED ORDER — ONDANSETRON HCL 4 MG/2ML IJ SOLN: 4.0000 mg | Freq: Four times a day (QID) | INTRAMUSCULAR | Status: DC | PRN

## 2023-12-17 MED ORDER — ACETAMINOPHEN 500 MG PO TABS
1000.0000 mg | ORAL_TABLET | Freq: Once | ORAL | Status: AC
  Administered 2023-12-17: 1000 mg via ORAL
  Filled 2023-12-17: qty 2

## 2023-12-17 NOTE — H&P (Incomplete)
 History and Physical    Steve Brown FMW:[id number] DOB: 06/25/1940 DOA: 12/17/2023  PCP: [facility]   Chief Complaint:  weakness  HPI: Steve Brown is a 83 y.o. male with medical history significant of with history of dementia, A-fib on Eliquis  who presents emergency department due to shortness of breath.  Patient woke up with worsening shortness of breath and headache.  He presented to the ER where he was found to be confused and only oriented to self which was reportedly not at his baseline.  He was afebrile and hemodynamically stable.  Labs were obtained which showed WBC 4.1, hemoglobin 12.4, troponin 63, BNP 272, urinalysis negative for infection.  Repeat troponin 81.  Creatinine 1.5 at baseline.  CT head showed no acute findings.  Chest x-ray showed pneumonia.  Patient was antibiotics admitted for further workup.   Review of Systems: Review of Systems  Constitutional:  Negative for chills and fever.  HENT: Negative.    Eyes: Negative.   Respiratory:  Positive for sputum production and shortness of breath.   Cardiovascular: Negative.   Gastrointestinal: Negative.   Genitourinary: Negative.   Musculoskeletal: Negative.   Skin: Negative.   Neurological:  Positive for headaches.  Endo/Heme/Allergies: Negative.   Psychiatric/Behavioral: Negative.       As per HPI otherwise 10 point review of systems negative.   Allergies  Allergen Reactions   Other Other (See Comments)    Per patient- cardiac cath dye-  woke up during procedure hysterical.   Phenergan  [Promethazine ] Other (See Comments)    Syncope  Mood changes   Topamax [Topiramate] Other (See Comments)    Hallucinations    Ultram  [Tramadol ] Other (See Comments)    Greatly increased blood sugar    Past Medical History:  Diagnosis Date   Abnormal prostate biopsy    Anticoagulant long-term use    currently xarelto    Anxiety    BPH with elevated PSA    CKD (chronic kidney disease), stage II     Complication of anesthesia    limted neck rom limited use of left arm due to cva   Coronary artery disease    CARDIOLOGIST-  DR DANN--  2010-- PCI w/ stenting midLAD   DDD (degenerative disc disease), lumbar    Degeneration of cervical intervertebral disc    Depression    Diabetes mellitus without complication (HCC)    Type 2   Dyspnea on exertion    GERD (gastroesophageal reflux disease)    Hemiparesis due to cerebral infarction    History of cerebrovascular accident (CVA) with residual deficit 2002 and 2003--  hemiparisis both sides   per MRI  anterior left frontal lobe, left para midline pons, and inferior cerebullam bilaterally infarcts   History of pulmonary embolus (PE)    06-30-2012  extensive bilaterally   History of recurrent TIAs    History of syncope    hx multiple pre-syncope and syncopal episodes due to vasovagal, orthostatic hypotension, dehydration   History of TIAs    several since 2002   Hyperlipidemia    Hypertension    Mild atherosclerosis of carotid artery, bilateral    per last duplex 11-04-2014  bilateral ICA 1--39%   Neuropathy    fingers   OSA on CPAP    uses most nights; followed by dr dohmeier--  sev. osa w/ AHI 65.9   Pneumonia    x 1   Prostate cancer (HCC) dx 2018   Renal insufficiency    S/P coronary artery stent  placement 2010   stenting to mid LAD   Sensorineural hearing loss, bilateral 04/01/2021   no hearing aids   Simple renal cyst    bilaterally   Stroke [facility])    Trigger finger of both hands 11/17/2013   Type 2 diabetes mellitus (HCC) dx 1986   last one A1c 9.2 on 04-26-2016   Unsteady gait    uses straight cane and occasional walker. Hx prior CVA/TIAs;   Vertebral artery occlusion, left    chronic    Past Surgical History:  Procedure Laterality Date   ANTERIOR CERVICAL DECOMP/DISCECTOMY FUSION  2004   C3 -- C6 limited rom   ARTERY BIOPSY Right 04/21/2022   Procedure: BIOPSY TEMPORAL ARTERY;  Surgeon: Gretta Lonni PARAS,  MD;  Location: [facility] OR;  Service: Vascular;  Laterality: Right;   CARDIAC CATHETERIZATION  06-10-2010   dr dann   wide patent LAD stent, mid lesion at the origin of the septal prior to the previous stent 40-50%/  normal LVF, ef 55%   CARDIOVASCULAR STRESS TEST  10-23-2012  dr dann   normal nuclear perfusion study w/ no ischemia/  normal LV function and wall motion , ef 65%   CARPAL TUNNEL RELEASE Bilateral    CATARACT EXTRACTION W/ INTRAOCULAR LENS  IMPLANT, BILATERAL     CHOLECYSTECTOMY N/A 11/02/2015   Procedure: LAPAROSCOPIC CHOLECYSTECTOMY WITH INTRAOPERATIVE CHOLANGIOGRAM;  Surgeon: Donnice Lima, MD;  Location: [facility] OR;  Service: General;  Laterality: N/A;   COLONOSCOPY     CORONARY ANGIOPLASTY WITH STENT PLACEMENT  02/2008   stenting to mid LAD   GOLD SEED IMPLANT N/A 11/15/2016   Procedure: GOLD SEED IMPLANT TIMES THREE;  Surgeon: Cam Morene ORN, MD;  Location: [facility];  Service: Urology;  Laterality: N/A;   IR ANGIO INTRA EXTRACRAN SEL COM CAROTID INNOMINATE BILAT MOD SED  06/13/2018   IR ANGIO VERTEBRAL SEL VERTEBRAL UNI R MOD SED  06/13/2018   IR US  GUIDE VASC ACCESS RIGHT  06/13/2018   LEFT HEART CATH AND CORONARY ANGIOGRAPHY N/A 05/25/2017   Procedure: LEFT HEART CATH AND CORONARY ANGIOGRAPHY;  Surgeon: Dann Candyce RAMAN, MD;  Location: [facility] INVASIVE CV LAB;  Service: Cardiovascular;  Laterality: N/A;   LEFT HEART CATHETERIZATION WITH CORONARY ANGIOGRAM N/A 04/03/2013   Procedure: LEFT HEART CATHETERIZATION WITH CORONARY ANGIOGRAM;  Surgeon: Candyce RAMAN Dann, MD;  Location: [facility] CATH LAB;  Service: Cardiovascular;  Laterality: N/A;  patent mLAD stent  w/ mild disease in remainder LAD and its branches;  mod. focal lesion midLCFx- FFR of lesion was negative for ischemia/  normal LVSF, ef 50%   lungs  2005   fluid pumped off lungs   NEUROPLASTY / TRANSPOSITION ULNAR NERVE AT ELBOW Right 2004   PROSTATE BIOPSY N/A 08/31/2016   Procedure: PROSTATE  BIOPSY  TRANSRECTAL ULTRASONIC PROSTATE (TUBP);  Surgeon: Cam Morene ORN, MD;  Location: [facility];  Service: Urology;  Laterality: N/A;   SPACE OAR INSTILLATION N/A 11/15/2016   Procedure: SPACE OAR INSTILLATION;  Surgeon: Cam Morene ORN, MD;  Location: [facility];  Service: Urology;  Laterality: N/A;   TRANSTHORACIC ECHOCARDIOGRAM  04/27/2016   severe focal basal LVH, ef 60-65%,  grade 2 diastoilc dysfunction/  mild AR, MR, and TR/  atrial septum lipomatous hypertrophy/  PASP   UMBILICAL HERNIA REPAIR       reports that he has never smoked. He has never been exposed to tobacco smoke. He has never used smokeless tobacco. He reports that  he does not currently use alcohol. He reports that he does not use drugs.  Family History  Problem Relation Age of Onset   Aneurysm Mother    Cancer Father        unknown either pancreatic or prostate   Dementia Sister    Stroke Brother    Stroke Daughter    Seizures Daughter    Multiple sclerosis Granddaughter    Heart attack Neg Hx    Sleep apnea Neg Hx     Prior to Admission medications   Medication Sig Start Date End Date Taking? Authorizing Provider  apixaban  (ELIQUIS ) 5 MG TABS tablet Take 5 mg by mouth 2 (two) times daily. 08/31/23   [provider]  buPROPion  (WELLBUTRIN  XL) 150 MG 24 hr tablet Take 150 mg by mouth daily.    [provider]  cephALEXin  (KEFLEX ) 250 MG capsule Take 250 mg by mouth at bedtime.    [provider]  Cholecalciferol  (VITAMIN D -3 PO) Take 1 tablet by mouth daily.    [provider]  diclofenac  Sodium (VOLTAREN ) 1 % GEL Apply 4 g topically 4 (four) times daily. Patient taking differently: Apply 4 g topically 4 (four) times daily as needed. 09/03/19   Emil Share, DO  donepezil  (ARICEPT ) 10 MG tablet Take 10 mg by mouth daily. 11/17/22   [provider]  escitalopram  (LEXAPRO ) 20 MG tablet Take 20 mg by mouth in the morning.    [provider]  finasteride  (PROSCAR ) 5 MG tablet Take 5 mg by mouth daily.    [provider]  fluticasone  (FLONASE ) 50 MCG/ACT nasal spray Place 2 sprays into both nostrils daily as needed for allergies or rhinitis.    [provider]  furosemide  (LASIX ) 20 MG tablet Take 1-2 tablets (20-40 mg total) by mouth daily as needed for fluid. 06/13/20   Medina-Vargas, Monina C, NP  HYDROcodone -acetaminophen  (NORCO/VICODIN) 5-325 MG tablet Take 1 tablet by mouth every 6 (six) hours as needed for severe pain (pain score 7-10). 11/10/23   Odell Balls, PA-C  insulin  aspart (NOVOLOG ) 100 UNIT/ML FlexPen Inject 5 Units into the skin 3 (three) times daily with meals. Patient taking differently: Inject 5-8 Units into the skin 3 (three) times daily before meals. 06/13/20   Medina-Vargas, Monina C, NP  ketoconazole  (NIZORAL ) 2 % cream Apply 1 Application topically 2 (two) times daily as needed (skin irritation).    [provider]  lidocaine  (LIDODERM ) 5 % Place 1 patch onto the skin daily. Remove & Discard patch within 12 hours or as directed by MD Patient taking differently: Place 1 patch onto the skin daily as needed (pain). 11/08/23   Theotis Cameron HERO, PA-C  lidocaine  (XYLOCAINE ) 5 % ointment Apply 1 Application topically every 4 (four) hours as needed (pain).    [provider]  losartan  (COZAAR ) 50 MG tablet Take 50 mg by mouth daily.    [provider]  methocarbamol  (ROBAXIN ) 500 MG tablet Take 1-2 tablets (500-1,000 mg total) by mouth every 8 (eight) hours as needed for muscle spasms. 10/26/23   CardamaRaynell Moder, MD  mirtazapine  (REMERON ) 15 MG tablet Take 7.5 mg by mouth at bedtime as needed (sleep, anxiety, nightmares).    [provider]  Multiple Vitamins-Minerals (MULTIVITAMIN MEN 50+) TABS Take 1 tablet by mouth daily.    [provider]  pantoprazole  (PROTONIX ) 40 MG tablet Take 40 mg by mouth daily.    [provider]   pregabalin  (LYRICA ) 300 MG  capsule Take 300 mg by mouth 2 (two) times daily. 04/26/20   [provider]  rosuvastatin  (CRESTOR ) 40 MG tablet Take 40 mg by mouth daily.    [provider]  tamsulosin  (FLOMAX ) 0.4 MG CAPS capsule Take 1 capsule (0.4 mg total) by mouth daily after supper. 09/30/23 09/29/24  Von Bellis, MD  TRESIBA  FLEXTOUCH 100 UNIT/ML FlexTouch Pen Inject 34 Units into the skin daily. Patient taking differently: Inject 26 Units into the skin daily. 06/13/20   Medina-Vargas, Monina C, NP  valACYclovir  (VALTREX ) 500 MG tablet Take 500 mg by mouth daily.    [provider]    Physical Exam: Vitals:   12/17/23 1541 12/17/23 1857  BP: (!) 167/90 (!) 165/80  Pulse: 62 67  Resp: 14 14  Temp: 98.1 F (36.7 C) 97.7 F (36.5 C)  TempSrc: Oral Oral  SpO2: 100% 100%   Physical Exam Constitutional:      Appearance: He is normal weight.  HENT:     Head: Normocephalic.     Mouth/Throat:     Mouth: Mucous membranes are moist.     Pharynx: Oropharynx is clear.  Eyes:     Conjunctiva/sclera: Conjunctivae normal.     Pupils: Pupils are equal, round, and reactive to light.  Cardiovascular:     Rate and Rhythm: Normal rate and regular rhythm.     Pulses: Normal pulses.     Heart sounds: Normal heart sounds.  Pulmonary:     Effort: Pulmonary effort is normal.     Breath sounds: Normal breath sounds.  Abdominal:     General: Abdomen is flat. Bowel sounds are normal.  Musculoskeletal:        General: Normal range of motion.     Cervical back: Normal range of motion.  Skin:    General: Skin is warm.     Capillary Refill: Capillary refill takes less than 2 seconds.  Neurological:     General: No focal deficit present.     Mental Status: He is alert. He is disoriented.  Psychiatric:        Mood and Affect: Mood normal.       Labs on Admission: I have personally reviewed the patients's labs and imaging studies.  Assessment/Plan Principal  Problem:   Encephalopathy   # Acute infectious encephalopathy most likely secondary to commune acquired pneumonia Patient presenting with confusion and not at mental baseline - Chest x-ray with pneumonia  Plan: Continue ceftriaxone  and azithromycin   # A-fib-continue Eliquis   # Depression-continue bupropion   # Cognitive impairment-continue donepezil   # BPH-continue finasteride , Flomax   # Type 2 diabetes-continue Lantus   # GERD-continue Protonix   # Hyperlipidemia-continue Crestor  ***   Admission status: Inpatient Med-Surg  Certification: The appropriate patient status for this patient is INPATIENT. Inpatient status is judged to be reasonable and necessary in order to provide the required intensity of service to ensure the patient's safety. The patient's presenting symptoms, physical exam findings, and initial radiographic and laboratory data in the context of their chronic comorbidities is felt to place them at high risk for further clinical deterioration. Furthermore, it is not anticipated that the patient will be medically stable for discharge from the hospital within 2 midnights of admission.   * I certify that at the point of admission it is my clinical judgment that the patient will require inpatient hospital care spanning beyond 2 midnights from the point of admission due to high intensity of service, high risk for further deterioration and high frequency of  surveillance required.DEWAINE Lamar Dess MD [facility] If 7PM-7AM, please contact night-coverage [email or web address]  12/17/2023, 10:00 PM

## 2023-12-17 NOTE — ED Provider Notes (Signed)
 [place] [facility]ERGENCY DEPARTMENT AT [facility] Provider Note   CSN: [id number] Arrival date & time: 12/17/23  1530     Patient presents with: Weakness   Steve Brown is a 83 y.o. male.   83 year old male with past medical history of dementia and atrial fibrillation on Eliquis  as well as diabetes and hyperlipidemia presenting to the emergency department today with headache and some shortness of breath/chest pressure.  The patient apparently woke up with the symptoms according to his EMS note but in speaking with him he tells me that he has been having headaches off and on now for quite some time.  He is alert and oriented x 2 and knows the year but not the month and I am unclear what his baseline is.  It does appear that he is on donepezil  so suspect he does have some underlying dementia at baseline.  His friend that initially spoke with medics did state that this was a change from his baseline.  The patient also reports some chills.  I am unclear how accurate the history taking is from the patient given his current mental status.   Weakness      Prior to Admission medications   Medication Sig Start Date End Date Taking? Authorizing Provider  apixaban  (ELIQUIS ) 5 MG TABS tablet Take 5 mg by mouth 2 (two) times daily. 08/31/23   [provider]  buPROPion  (WELLBUTRIN  XL) 150 MG 24 hr tablet Take 150 mg by mouth daily.    [provider]  cephALEXin  (KEFLEX ) 250 MG capsule Take 250 mg by mouth at bedtime.    [provider]  Cholecalciferol  (VITAMIN D -3 PO) Take 1 tablet by mouth daily.    [provider]  diclofenac  Sodium (VOLTAREN ) 1 % GEL Apply 4 g topically 4 (four) times daily. Patient taking differently: Apply 4 g topically 4 (four) times daily as needed. 09/03/19   Floyd, Dan, DO  donepezil  (ARICEPT ) 10 MG tablet Take 10 mg by mouth daily. 11/17/22   [provider]  escitalopram  (LEXAPRO ) 20 MG tablet Take 20 mg by mouth in  the morning.    [provider]  finasteride  (PROSCAR ) 5 MG tablet Take 5 mg by mouth daily.    [provider]  fluticasone  (FLONASE ) 50 MCG/ACT nasal spray Place 2 sprays into both nostrils daily as needed for allergies or rhinitis.    [provider]  furosemide  (LASIX ) 20 MG tablet Take 1-2 tablets (20-40 mg total) by mouth daily as needed for fluid. 06/13/20   Medina-Vargas, Monina C, NP  HYDROcodone -acetaminophen  (NORCO/VICODIN) 5-325 MG tablet Take 1 tablet by mouth every 6 (six) hours as needed for severe pain (pain score 7-10). 11/10/23   Odell Balls, PA-C  insulin  aspart (NOVOLOG ) 100 UNIT/ML FlexPen Inject 5 Units into the skin 3 (three) times daily with meals. Patient taking differently: Inject 5-8 Units into the skin 3 (three) times daily before meals. 06/13/20   Medina-Vargas, Monina C, NP  ketoconazole  (NIZORAL ) 2 % cream Apply 1 Application topically 2 (two) times daily as needed (skin irritation).    [provider]  lidocaine  (LIDODERM ) 5 % Place 1 patch onto the skin daily. Remove & Discard patch within 12 hours or as directed by MD Patient taking differently: Place 1 patch onto the skin daily as needed (pain). 11/08/23   Theotis Peers M, PA-C  lidocaine  (XYLOCAINE ) 5 % ointment Apply 1 Application topically every 4 (four) hours as needed (pain).    [provider]  losartan  (COZAAR ) 50 MG tablet Take 50 mg by mouth daily.    [provider]  methocarbamol  (ROBAXIN ) 500 MG tablet Take 1-2 tablets (500-1,000 mg total) by mouth every 8 (eight) hours as needed for muscle spasms. 10/26/23   CardamaRaynell Moder, MD  mirtazapine  (REMERON ) 15 MG tablet Take 7.5 mg by mouth at bedtime as needed (sleep, anxiety, nightmares).    [provider]  Multiple Vitamins-Minerals (MULTIVITAMIN MEN 50+) TABS Take 1 tablet by mouth daily.    [provider]  pantoprazole  (PROTONIX ) 40 MG tablet Take 40 mg by mouth daily.     [provider]  pregabalin  (LYRICA ) 300 MG capsule Take 300 mg by mouth 2 (two) times daily. 04/26/20   [provider]  rosuvastatin  (CRESTOR ) 40 MG tablet Take 40 mg by mouth daily.    [provider]  tamsulosin  (FLOMAX ) 0.4 MG CAPS capsule Take 1 capsule (0.4 mg total) by mouth daily after supper. 09/30/23 09/29/24  Von Bellis, MD  TRESIBA  FLEXTOUCH 100 UNIT/ML FlexTouch Pen Inject 34 Units into the skin daily. Patient taking differently: Inject 26 Units into the skin daily. 06/13/20   Medina-Vargas, Monina C, NP  valACYclovir  (VALTREX ) 500 MG tablet Take 500 mg by mouth daily.    [provider]    Allergies: Other, Phenergan  [promethazine ], Topamax [topiramate], and Ultram  [tramadol ]    Review of Systems  Reason unable to perform ROS: Dementia.  Neurological:  Positive for weakness.  All other systems reviewed and are negative.   Updated Vital Signs BP (!) 165/80 (BP Location: Right Arm)   Pulse 67   Temp 97.7 F (36.5 C) (Oral)   Resp 14   SpO2 100%   Physical Exam Vitals and nursing note reviewed.   Gen: NAD Eyes: PERRL, EOMI HEENT: no oropharyngeal swelling Neck: trachea midline, no meningismus Resp: clear to auscultation bilaterally although diminished at bilateral lung bases Card: RRR, no murmurs, rubs, or gallops Abd: nontender, nondistended Extremities: no calf tenderness, no edema Vascular: 2+ radial pulses bilaterally, 2+ DP pulses bilaterally Neuro: Alert and oriented x 2, the patient has equal strength and sensation throughout the bilateral upper and lower extremities Skin: no rashes Psyc: acting appropriately   (all labs ordered are listed, but only abnormal results are displayed) Labs Reviewed  CBC - Abnormal; Notable for the following components:      Result Value   RBC 3.94 (*)    Hemoglobin 12.4 (*)    HCT 35.6 (*)    All other components within normal limits  BRAIN NATRIURETIC PEPTIDE - Abnormal; Notable for  the following components:   B Natriuretic Peptide 172.6 (*)    All other components within normal limits  URINALYSIS, ROUTINE W REFLEX MICROSCOPIC - Abnormal; Notable for the following components:   Glucose, UA >=500 (*)    Ketones, ur 20 (*)    Protein, ur 30 (*)    Bacteria, UA RARE (*)    All other components within normal limits  BASIC METABOLIC PANEL WITH GFR - Abnormal; Notable for the following components:   Glucose, Bld 290 (*)    Creatinine, Ser 1.54 (*)    Calcium  8.8 (*)    GFR, Estimated 45 (*)    All other components within normal limits  TROPONIN I (HIGH SENSITIVITY) - Abnormal; Notable for the following components:   Troponin I (High Sensitivity) 63 (*)    All other components within normal limits  TROPONIN I (HIGH SENSITIVITY) - Abnormal; Notable  for the following components:   Troponin I (High Sensitivity) 81 (*)    All other components within normal limits  RESP PANEL BY RT-PCR (RSV, FLU A&B, COVID)  RVPGX2  CULTURE, BLOOD (ROUTINE X 2)  CULTURE, BLOOD (ROUTINE X 2)    EKG: EKG Interpretation Date/Time:  Monday December 17 2023 15:40:11 EST Ventricular Rate:  61 PR Interval:  160 QRS Duration:  83 QT Interval:  441 QTC Calculation: 445 R Axis:   8  Text Interpretation: Sinus rhythm RSR' in V1 or V2, right VCD or RVH Confirmed by Ula Barter [phone]) on 12/17/2023 5:15:46 PM  Radiology: CT Head Wo Contrast Result Date: 12/17/2023 EXAM: CT HEAD WITHOUT CONTRAST 12/17/2023 05:47:29 PM TECHNIQUE: CT of the head was performed without the administration of intravenous contrast. Automated exposure control, iterative reconstruction, and/or weight based adjustment of the mA/kV was utilized to reduce the radiation dose to as low as reasonably achievable. COMPARISON: 11/21/2023 CLINICAL HISTORY: Headache, increasing frequency or severity. FINDINGS: BRAIN AND VENTRICLES: No acute hemorrhage. No evidence of acute infarct. Patchy and confluent decreased attenuation  throughout deep and periventricular white matter of cerebral hemispheres bilaterally, compatible with chronic microvascular ischemic disease. Chronic left cerebellar infarction. Cerebral ventricle sizes concordant with degree of cerebral volume loss. No extra-axial collection. No mass effect or midline shift. ORBITS: Bilateral lens replacement. No acute abnormality. SINUSES: Unchanged chronic right sphenoid sinusitis. Complete opacification of the right sphenoid sinus and partial opacification of the left sphenoid sinus. Significant mucosal thickening within the left posterior ethmoid air cells. Additional scattered mucosal thickening in the bilateral ethmoid and maxillary sinuses. SOFT TISSUES AND SKULL: Similar-appearing left frontal scalp lipoma. No acute soft tissue abnormality. No skull fracture. VASCULATURE: Atherosclerosis of the carotid siphons. IMPRESSION: 1. No acute intracranial abnormality. 2. 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 3. Unchanged chronic right sphenoid sinusitis with additional scattered mucosal thickening in the bilateral ethmoid and maxillary sinuses. Electronically signed by: Donnice Mania MD 12/17/2023 06:05 PM EST RP Workstation: [id number]   DG Chest Port 1 View Result Date: 12/17/2023 CLINICAL DATA:  Short of breath EXAM: PORTABLE CHEST 1 VIEW COMPARISON:  11/21/2023 FINDINGS: Single frontal view of the chest demonstrates an unremarkable cardiac silhouette. There is patchy opacity at the left lateral lung base obscuring the costophrenic angle, consistent with consolidation and/or small effusion. Right chest is clear. No pneumothorax. No acute bony abnormalities. IMPRESSION: 1. Patchy left lower lobe consolidation and/or small left pleural effusion, obscuring the left costophrenic angle. Electronically Signed   By: Ozell Daring M.D.   On: 12/17/2023 17:42     Procedures   Medications Ordered in the ED  acetaminophen  (TYLENOL ) tablet  1,000 mg (has no administration in time range)  cefTRIAXone  (ROCEPHIN ) 2 g in sodium chloride  0.9 % 100 mL IVPB (has no administration in time range)  azithromycin  (ZITHROMAX ) 500 mg in sodium chloride  0.9 % 250 mL IVPB (has no administration in time range)                                    Medical Decision Making 83 year old male with past medical history of diabetes, hypertension, hyperlipidemia, and dementia presenting to the emergency department today with complaints of headache and shortness of breath.  I will further evaluate patient here with a cardiac evaluation which will also evaluate for electrolyte abnormalities or anemia.  Will obtain chest x-ray to for pulm edema, pulmonary infiltrates,  pneumothorax.  Also obtain EKG here as well as a troponin to eval for atypical ACS.  I will obtain a CT scan of the patient's head as it does appear that he is on Eliquis .  I will reevaluate for ultimate disposition.  The patient's labs are largely unremarkable.  CT scan shows some chronic sinusitis but no other acute findings.  The patient's chest x-ray does show a lower lobar opacity with small effusion.  The patient is covered with Rocephin  and azithromycin .  I did call and discussed patient's case with his daughter.  She does report that the patient does have underlying dementia and does seem to have had intermittent confusion now over the past 2 days.  She reports that he will be lucid at some points and then confused at others.  This does seem consistent with delirium.  Will obtain cultures and start the patient on IV antibiotics.  Calls placed for admission.  Amount and/or Complexity of Data Reviewed Labs: ordered. Radiology: ordered.  Risk OTC drugs. Decision regarding hospitalization.        Final diagnoses:  Pneumonia due to infectious organism, unspecified laterality, unspecified part of lung  Delirium    ED Discharge Orders     None          Ula Prentice SAUNDERS,  MD 12/17/23 2111

## 2023-12-17 NOTE — ED Notes (Signed)
 Requested ordered medication from pharmacy

## 2023-12-17 NOTE — ED Notes (Signed)
Shift report received, assumed care at this time.  

## 2023-12-17 NOTE — ED Triage Notes (Signed)
 Patient BIB EMS from home c/o weakness for the past 2 days. Patient states that he woke up this morning with a headache and pain in his left arm. Patient was negative for stroke screening with EMS. EMS reports patient is sluggish. Patient's friend reports that he is just not his normal self.

## 2023-12-18 ENCOUNTER — Ambulatory Visit: Admitting: Podiatry

## 2023-12-18 ENCOUNTER — Inpatient Hospital Stay (HOSPITAL_COMMUNITY)

## 2023-12-18 DIAGNOSIS — I10 Essential (primary) hypertension: Secondary | ICD-10-CM

## 2023-12-18 DIAGNOSIS — E785 Hyperlipidemia, unspecified: Secondary | ICD-10-CM

## 2023-12-18 DIAGNOSIS — R7989 Other specified abnormal findings of blood chemistry: Secondary | ICD-10-CM | POA: Diagnosis not present

## 2023-12-18 DIAGNOSIS — J189 Pneumonia, unspecified organism: Principal | ICD-10-CM

## 2023-12-18 DIAGNOSIS — R0602 Shortness of breath: Secondary | ICD-10-CM

## 2023-12-18 DIAGNOSIS — I2510 Atherosclerotic heart disease of native coronary artery without angina pectoris: Secondary | ICD-10-CM

## 2023-12-18 LAB — GLUCOSE, CAPILLARY
Glucose-Capillary: 68 mg/dL — ABNORMAL LOW (ref 70–99)
Glucose-Capillary: 151 mg/dL — ABNORMAL HIGH (ref 70–99)

## 2023-12-18 LAB — ECHOCARDIOGRAM LIMITED
Area-P 1/2: 3.42 cm2
S' Lateral: 2.4 cm

## 2023-12-18 LAB — CBC
HCT: 35.9 % — ABNORMAL LOW (ref 39.0–52.0)
Hemoglobin: 12.4 g/dL — ABNORMAL LOW (ref 13.0–17.0)
MCH: 31.8 pg (ref 26.0–34.0)
MCHC: 34.5 g/dL (ref 30.0–36.0)
MCV: 92.1 fL (ref 80.0–100.0)
Platelets: 168 K/uL (ref 150–400)
RBC: 3.9 MIL/uL — ABNORMAL LOW (ref 4.22–5.81)
RDW: 13.2 % (ref 11.5–15.5)
WBC: 4 K/uL (ref 4.0–10.5)
nRBC: 0 % (ref 0.0–0.2)

## 2023-12-18 LAB — BASIC METABOLIC PANEL WITH GFR
Anion gap: 9 (ref 5–15)
BUN: 19 mg/dL (ref 8–23)
CO2: 25 mmol/L (ref 22–32)
Calcium: 8.5 mg/dL — ABNORMAL LOW (ref 8.9–10.3)
Chloride: 102 mmol/L (ref 98–111)
Creatinine, Ser: 1.59 mg/dL — ABNORMAL HIGH (ref 0.61–1.24)
GFR, Estimated: 43 mL/min — ABNORMAL LOW (ref >60)
Glucose, Bld: 277 mg/dL — ABNORMAL HIGH (ref 70–99)
Potassium: 4 mmol/L (ref 3.5–5.1)
Sodium: 136 mmol/L (ref 135–145)

## 2023-12-18 LAB — TROPONIN I (HIGH SENSITIVITY)
Troponin I (High Sensitivity): 127 ng/L (ref <18)
Troponin I (High Sensitivity): 136 ng/L (ref <18)
Troponin I (High Sensitivity): 143 ng/L (ref <18)
Troponin I (High Sensitivity): 155 ng/L (ref <18)

## 2023-12-18 LAB — CBG MONITORING, ED
Glucose-Capillary: 206 mg/dL — ABNORMAL HIGH (ref 70–99)
Glucose-Capillary: 218 mg/dL — ABNORMAL HIGH (ref 70–99)
Glucose-Capillary: 337 mg/dL — ABNORMAL HIGH (ref 70–99)
Glucose-Capillary: 304 mg/dL — ABNORMAL HIGH (ref 70–99)
Glucose-Capillary: 60 mg/dL — ABNORMAL LOW (ref 70–99)

## 2023-12-18 MED ORDER — DEXTROSE 50 % IV SOLN: 12.5000 g | INTRAVENOUS | Status: DC | PRN

## 2023-12-18 MED ORDER — DEXTROSE 50 % IV SOLN: 12.5000 g | INTRAVENOUS | Status: DC

## 2023-12-18 MED ORDER — INSULIN ASPART 100 UNIT/ML IJ SOLN
0.0000 [IU] | INTRAMUSCULAR | Status: DC
  Administered 2023-12-18: 1 [IU] via SUBCUTANEOUS
  Administered 2023-12-19: 3 [IU] via SUBCUTANEOUS
  Administered 2023-12-19: 4 [IU] via SUBCUTANEOUS
  Administered 2023-12-19: 2 [IU] via SUBCUTANEOUS
  Administered 2023-12-19: 4 [IU] via SUBCUTANEOUS
  Administered 2023-12-20 (×2): 2 [IU] via SUBCUTANEOUS
  Filled 2023-12-18: qty 3
  Filled 2023-12-18: qty 1
  Filled 2023-12-18: qty 2
  Filled 2023-12-18: qty 4
  Filled 2023-12-18 (×3): qty 2

## 2023-12-18 NOTE — Consult Note (Addendum)
 Cardiology Consultation  Patient ID: Steve Brown MRN: [id number]; DOB: 05/20/1940  Admit date: 12/17/2023 Date of Consult: 12/18/2023  PCP:  [facility]   [facility] Providers Cardiologist:  Candyce Reek, MD     Patient Profile: Steve Brown is a 83 y.o. male with a hx of prostate cancer s/p radiation therapy in 2018, OSA on CPAP, hypertension, hyperlipidemia, CAD s/p DES to LAD, history of DVT and PE on Eliquis , history of TIA, diabetes, cognitive impairment, BPH, GERD, depression who is being seen 12/18/2023 for the evaluation of elevated troponin levels at the request of Dr. Leotis.  History of Present Illness: Steve Brown has past medical history as listed above. He [facility] ED on 12/17/2023 for weakness, shortness of breath, headache.  He has baseline cognitive impairment, which makes history difficult to obtain.  In the emergency department he was noted to be only oriented to self, which is not his baseline.  Relevant workup on the ED includes: Troponin level 63 ? 81 ? 127 ? 136 ? 143 ? 155, metabolic panel showed creatinine at 1.54 (baseline between 1.7-1.9), CBC showed chronic anemia with hemoglobin at baseline 12, BNP mildly elevated at 172.  CXR showed patchy LLL consolidation, small left pleural effusion.  CT head showed no acute intracranial abnormality, chronic microvascular ischemic disease, chronic left cerebral infarction. EKG showed sinus rhythm with HR 74 with no acute ischemic changes.  He has been admitted to the medicine service for: Encephalopathy suspected to be secondary to pneumonia.  Cardiology was asked to consult in the setting of elevated troponins.  Echocardiogram from November 2025 showed: LVEF 50 to 55%, no RWMA, moderate concentric LVH, G1 DD, normal RV systolic function, no significant valvular abnormalities, normal IVC.  Cardiac catheterization from May 2019 showed: Nonobstructive disease, patent previously  placed mid LAD stent, LVEF 55 to 60%.  Nuclear stress test from August 2021 showed: No reversible ischemia or infarction, normal LV wall motion, LVEF 54%, low risk.  He was previously seen by Dr. Reek but was last seen by Dr. Delford in January 2025 as an outpatient for CAD.  At this appointment he was noted to be doing well.  His medication regimen at this time included: PO Lasix  20 mg daily PRN, losartan  25 mg daily, Crestor  20 mg daily, Xarelto  20 mg daily.  LDL was noted to be 66.   After speaking with the patient, he appears more alert and oriented. He tells me that he feels much better when compared to arrival to the hospital. He is unsure of much history prior to today. He has no family present with him. He denies any prior or current chest pain. He tells me that he is able to do what he wants at home, in terms of ADLs, with no restrictions or symptoms. He is resting comfortably in the hallway bed in the ER. He denies any recent use of Lasix .   Past Medical History:  Diagnosis Date   Abnormal prostate biopsy    Anticoagulant long-term use    currently xarelto    Anxiety    BPH with elevated PSA    CKD (chronic kidney disease), stage II    Complication of anesthesia    limted neck rom limited use of left arm due to cva   Coronary artery disease    CARDIOLOGIST-  DR REEK--  2010-- PCI w/ stenting midLAD   DDD (degenerative disc disease), lumbar    Degeneration of cervical intervertebral disc  Depression    Diabetes mellitus without complication (HCC)    Type 2   Dyspnea on exertion    GERD (gastroesophageal reflux disease)    Hemiparesis due to cerebral infarction    History of cerebrovascular accident (CVA) with residual deficit 2002 and 2003--  hemiparisis both sides   per MRI  anterior left frontal lobe, left para midline pons, and inferior cerebullam bilaterally infarcts   History of pulmonary embolus (PE)    06-30-2012  extensive bilaterally   History of recurrent  TIAs    History of syncope    hx multiple pre-syncope and syncopal episodes due to vasovagal, orthostatic hypotension, dehydration   History of TIAs    several since 2002   Hyperlipidemia    Hypertension    Mild atherosclerosis of carotid artery, bilateral    per last duplex 11-04-2014  bilateral ICA 1--39%   Neuropathy    fingers   OSA on CPAP    uses most nights; followed by dr dohmeier--  sev. osa w/ AHI 65.9   Pneumonia    x 1   Prostate cancer (HCC) dx 2018   Renal insufficiency    S/P coronary artery stent placement 2010   stenting to mid LAD   Sensorineural hearing loss, bilateral 04/01/2021   no hearing aids   Simple renal cyst    bilaterally   Stroke [facility])    Trigger finger of both hands 11/17/2013   Type 2 diabetes mellitus (HCC) dx 1986   last one A1c 9.2 on 04-26-2016   Unsteady gait    uses straight cane and occasional walker. Hx prior CVA/TIAs;   Vertebral artery occlusion, left    chronic   Past Surgical History:  Procedure Laterality Date   ANTERIOR CERVICAL DECOMP/DISCECTOMY FUSION  2004   C3 -- C6 limited rom   ARTERY BIOPSY Right 04/21/2022   Procedure: BIOPSY TEMPORAL ARTERY;  Surgeon: Gretta Steve PARAS, MD;  Location: [facility] OR;  Service: Vascular;  Laterality: Right;   CARDIAC CATHETERIZATION  06-10-2010   dr dann   wide patent LAD stent, mid lesion at the origin of the septal prior to the previous stent 40-50%/  normal LVF, ef 55%   CARDIOVASCULAR STRESS TEST  10-23-2012  dr dann   normal nuclear perfusion study w/ no ischemia/  normal LV function and wall motion , ef 65%   CARPAL TUNNEL RELEASE Bilateral    CATARACT EXTRACTION W/ INTRAOCULAR LENS  IMPLANT, BILATERAL     CHOLECYSTECTOMY N/A 11/02/2015   Procedure: LAPAROSCOPIC CHOLECYSTECTOMY WITH INTRAOPERATIVE CHOLANGIOGRAM;  Surgeon: Donnice Lima, MD;  Location: [facility] OR;  Service: General;  Laterality: N/A;   COLONOSCOPY     CORONARY ANGIOPLASTY WITH STENT PLACEMENT  02/2008   stenting to  mid LAD   GOLD SEED IMPLANT N/A 11/15/2016   Procedure: GOLD SEED IMPLANT TIMES THREE;  Surgeon: Cam Morene ORN, MD;  Location: [facility];  Service: Urology;  Laterality: N/A;   IR ANGIO INTRA EXTRACRAN SEL COM CAROTID INNOMINATE BILAT MOD SED  06/13/2018   IR ANGIO VERTEBRAL SEL VERTEBRAL UNI R MOD SED  06/13/2018   IR US  GUIDE VASC ACCESS RIGHT  06/13/2018   LEFT HEART CATH AND CORONARY ANGIOGRAPHY N/A 05/25/2017   Procedure: LEFT HEART CATH AND CORONARY ANGIOGRAPHY;  Surgeon: Dann Candyce RAMAN, MD;  Location: [facility] INVASIVE CV LAB;  Service: Cardiovascular;  Laterality: N/A;   LEFT HEART CATHETERIZATION WITH CORONARY ANGIOGRAM N/A 04/03/2013   Procedure: LEFT HEART CATHETERIZATION WITH CORONARY ANGIOGRAM;  Surgeon: Candyce GORMAN Reek, MD;  Location: [facility] CATH LAB;  Service: Cardiovascular;  Laterality: N/A;  patent mLAD stent  w/ mild disease in remainder LAD and its branches;  mod. focal lesion midLCFx- FFR of lesion was negative for ischemia/  normal LVSF, ef 50%   lungs  2005   fluid pumped off lungs   NEUROPLASTY / TRANSPOSITION ULNAR NERVE AT ELBOW Right 2004   PROSTATE BIOPSY N/A 08/31/2016   Procedure: PROSTATE  BIOPSY TRANSRECTAL ULTRASONIC PROSTATE (TUBP);  Surgeon: Cam Morene ORN, MD;  Location: [facility];  Service: Urology;  Laterality: N/A;   SPACE OAR INSTILLATION N/A 11/15/2016   Procedure: SPACE OAR INSTILLATION;  Surgeon: Cam Morene ORN, MD;  Location[facility];  Service: Urology;  Laterality: N/A;   TRANSTHORACIC ECHOCARDIOGRAM  04/27/2016   severe focal basal LVH, ef 60-65%,  grade 2 diastoilc dysfunction/  mild AR, MR, and TR/  atrial septum lipomatous hypertrophy/  PASP   UMBILICAL HERNIA REPAIR      Home Medications:  Prior to Admission medications   Medication Sig Start Date End Date Taking? Authorizing Provider  apixaban  (ELIQUIS ) 5 MG TABS tablet Take 5 mg by mouth 2 (two) times daily. 08/31/23  Yes  [provider]  buPROPion  (WELLBUTRIN  XL) 150 MG 24 hr tablet Take 150 mg by mouth daily.   Yes [provider]  cephALEXin  (KEFLEX ) 250 MG capsule Take 250 mg by mouth at bedtime.   Yes [provider]  Cholecalciferol  (VITAMIN D -3 PO) Take 1 tablet by mouth daily.   Yes [provider]  diclofenac  Sodium (VOLTAREN ) 1 % GEL Apply 4 g topically 4 (four) times daily. Patient taking differently: Apply 4 g topically 4 (four) times daily as needed. 09/03/19  Yes Emil Share, DO  donepezil  (ARICEPT ) 10 MG tablet Take 10 mg by mouth daily. 11/17/22  Yes [provider]  escitalopram  (LEXAPRO ) 20 MG tablet Take 20 mg by mouth in the morning.   Yes [provider]  finasteride  (PROSCAR ) 5 MG tablet Take 5 mg by mouth daily.   Yes [provider]  furosemide  (LASIX ) 20 MG tablet Take 1-2 tablets (20-40 mg total) by mouth daily as needed for fluid. Patient taking differently: Take 20 mg by mouth daily. 06/13/20  Yes Medina-Vargas, Monina C, NP  HYDROcodone -acetaminophen  (NORCO/VICODIN) 5-325 MG tablet Take 1 tablet by mouth every 6 (six) hours as needed for severe pain (pain score 7-10). 11/10/23  Yes Odell Balls, PA-C  insulin  aspart (NOVOLOG ) 100 UNIT/ML FlexPen Inject 5 Units into the skin 3 (three) times daily with meals. Patient taking differently: Inject 5-8 Units into the skin 3 (three) times daily before meals. 06/13/20  Yes Medina-Vargas, Monina C, NP  ketoconazole  (NIZORAL ) 2 % cream Apply 1 Application topically 2 (two) times daily as needed (skin irritation).   Yes [provider]  lidocaine  (LIDODERM ) 5 % Place 1 patch onto the skin daily. Remove & Discard patch within 12 hours or as directed by MD Patient taking differently: Place 1 patch onto the skin daily as needed (pain). 11/08/23  Yes Fleming, Conner M, PA-C  lidocaine  (XYLOCAINE ) 5 % ointment Apply 1 Application topically every 4 (four) hours as needed (pain).   Yes  [provider]  losartan  (COZAAR ) 25 MG tablet Take 25 mg by mouth daily. 11/29/23  Yes [provider]  methocarbamol  (ROBAXIN ) 500 MG tablet Take 1-2 tablets (500-1,000 mg total) by mouth every 8 (eight) hours as needed  for muscle spasms. 10/26/23  Yes Cardama, Raynell Moder, MD  mirtazapine  (REMERON ) 15 MG tablet Take 7.5 mg by mouth at bedtime as needed (sleep, anxiety, nightmares).   Yes [provider]  Multiple Vitamins-Minerals (MULTIVITAMIN MEN 50+) TABS Take 1 tablet by mouth daily.   Yes [provider]  pantoprazole  (PROTONIX ) 40 MG tablet Take 40 mg by mouth daily.   Yes [provider]  pregabalin  (LYRICA ) 300 MG capsule Take 300 mg by mouth 2 (two) times daily. 04/26/20  Yes [provider]  rosuvastatin  (CRESTOR ) 40 MG tablet Take 40 mg by mouth daily.   Yes [provider]  tamsulosin  (FLOMAX ) 0.4 MG CAPS capsule Take 1 capsule (0.4 mg total) by mouth daily after supper. 09/30/23 09/29/24 Yes Von Bellis, MD  TRESIBA  FLEXTOUCH 100 UNIT/ML FlexTouch Pen Inject 34 Units into the skin daily. Patient taking differently: Inject 26 Units into the skin daily. 06/13/20  Yes Medina-Vargas, Monina C, NP  valACYclovir  (VALTREX ) 500 MG tablet Take 500 mg by mouth daily.   Yes [provider]  fluticasone  (FLONASE ) 50 MCG/ACT nasal spray Place 2 sprays into both nostrils daily as needed for allergies or rhinitis.    [provider]   Scheduled Meds:  apixaban   5 mg Oral BID   azithromycin   500 mg Oral Daily   buPROPion   150 mg Oral Daily   donepezil   10 mg Oral QHS   escitalopram   20 mg Oral q AM   finasteride   5 mg Oral Daily   insulin  aspart  0-24 Units Subcutaneous TID WC   insulin  glargine  25 Units Subcutaneous Daily   losartan   50 mg Oral Daily   pantoprazole   40 mg Oral Daily   pregabalin   300 mg Oral BID   rosuvastatin   40 mg Oral QHS   tamsulosin   0.4 mg Oral QPC supper   Continuous Infusions:   cefTRIAXone  (ROCEPHIN )  IV Stopped (12/17/23 2257)   PRN Meds: acetaminophen  **OR** acetaminophen , mirtazapine , ondansetron  **OR** ondansetron  (ZOFRAN ) IV, oxyCODONE   Allergies:    Allergies  Allergen Reactions   Other Other (See Comments)    Per patient- cardiac cath dye-  woke up during procedure hysterical.   Phenergan  [Promethazine ] Other (See Comments)    Syncope  Mood changes   Topamax [Topiramate] Other (See Comments)    Hallucinations    Ultram  [Tramadol ] Other (See Comments)    Greatly increased blood sugar   Social History:   Social History   Socioeconomic History   Marital status: Single    Spouse name: Not on file   Number of children: 4   Years of education: 12   Highest education level: 12th grade  Occupational History    Employer: RETIRED    Comment: retired  Tobacco Use   Smoking status: Never    Passive exposure: Never   Smokeless tobacco: Never  Vaping Use   Vaping status: Never Used  Substance and Sexual Activity   Alcohol use: Not Currently   Drug use: Never   Sexual activity: Not Currently  Other Topics Concern   Not on file  Social History Narrative   Patient lives at home alone and he is single.     Patient is retired.    Caffeine - one cup daily.   Right handed.   Social Drivers of Corporate Investment Banker Strain: Low Risk  (02/10/2019)   Overall Financial Resource Strain (CARDIA)    Difficulty of Paying Living Expenses: Not hard at  all  Food Insecurity: No Food Insecurity (12/14/2023)   Hunger Vital Sign    Worried About Running Out of Food in the Last Year: Never true    Ran Out of Food in the Last Year: Never true  Transportation Needs: No Transportation Needs (12/14/2023)   PRAPARE - Administrator, Civil Service (Medical): No    Lack of Transportation (Non-Medical): No  Physical Activity: Inactive (09/09/2019)   Exercise Vital Sign    Days of Exercise per Week: 0 days    Minutes of Exercise per Session: 0 min   Stress: Not on file  Social Connections: Moderately Integrated (11/21/2023)   Social Connection and Isolation Panel    Frequency of Communication with Friends and Family: More than three times a week    Frequency of Social Gatherings with Friends and Family: Once a week    Attends Religious Services: More than 4 times per year    Active Member of [employer] or Organizations: No    Attends Engineer, Structural: More than 4 times per year    Marital Status: Widowed  Intimate Partner Violence: Not At Risk (12/14/2023)   Humiliation, Afraid, Rape, and Kick questionnaire    Fear of Current or Ex-Partner: No    Emotionally Abused: No    Physically Abused: No    Sexually Abused: No    Family History:   Family History  Problem Relation Age of Onset   Aneurysm Mother    Cancer Father        unknown either pancreatic or prostate   Dementia Sister    Stroke Brother    Stroke Daughter    Seizures Daughter    Multiple sclerosis Granddaughter    Heart attack Neg Hx    Sleep apnea Neg Hx     ROS:  Please see the history of present illness.  All other ROS reviewed and negative.     Physical Exam/Data: Vitals:   12/18/23 0815 12/18/23 0830 12/18/23 0845 12/18/23 1020  BP:      Pulse: (!) 57 61 71   Resp: 11 12 13    Temp:    98.1 F (36.7 C)  TempSrc:    Oral  SpO2: 96% 100% 100%    Intake/Output Summary (Last 24 hours) at 12/18/2023 1121 Last data filed at 12/17/2023 2310 Gross per 24 hour  Intake 350 ml  Output --  Net 350 ml      11/23/2023    4:13 AM 11/18/2023    2:30 PM 11/10/2023    4:54 PM  Last 3 Weights  Weight (lbs) 170 lb 6.7 oz 184 lb 15.5 oz 184 lb 15.5 oz  Weight (kg) 77.3 kg 83.9 kg 83.9 kg     There is no height or weight on file to calculate BMI.  General:  Well nourished, well developed, in no acute distress HEENT: normal Neck: no JVD Vascular: No carotid bruits; Distal pulses 2+ bilaterally Cardiac:  normal S1, S2; RRR; no murmur  Lungs:  clear to  auscultation bilaterally, no wheezing, rhonchi or rales  Abd: soft, nontender, no hepatomegaly  Ext: 1+ LE edema Musculoskeletal:  No deformities Skin: warm and dry  Neuro:  no focal abnormalities noted Psych:  Normal affect   EKG:  The EKG was personally reviewed and demonstrates:  sinus rhythm, no acute ischemic changes   Relevant CV Studies:  Echocardiogram, 11/22/2023 Left ventricular ejection fraction, by estimation, is 50 to 55% . The left ventricle has low normal  function. The left ventricle has no regional wall motion abnormalities. There is moderate concentric left ventricular hypertrophy. Left ventricular diastolic parameters are consistent with Grade I diastolic dysfunction ( impaired relaxation)  Right ventricular systolic function is normal. The right ventricular size is normal. The mitral valve is normal in structure. No evidence of mitral valve regurgitation. No evidence of mitral stenosis.  The aortic valve is normal in structure. Aortic valve regurgitation is trivial. Aortic valve sclerosis is present, with no evidence of aortic valve stenosis.  The inferior vena cava is normal in size with greater than 50% respiratory variability, suggesting right atrial pressure of 3 mmHg.  Nuclear stress test, 08/13/2019 No reversible ischemia or infarction. Normal left ventricular wall motion. Left ventricular ejection fraction 54% Non invasive risk stratification*: Low  Left heart cath, 05/25/2017 Acute Mrg lesion is 25% stenosed. Mid RCA lesion is 25% stenosed. 2nd Mrg lesion is 40% stenosed. Mid Cx lesion is 40% stenosed. Previously placed Mid LAD stent (unknown type) is widely patent. Prox LAD-1 lesion is 25% stenosed. Prox LAD-2 lesion is 25% stenosed. The left ventricular systolic function is normal. LV end diastolic pressure is normal. The left ventricular ejection fraction is 55-65% by visual estimate. There is no aortic valve stenosis.   Patent stent.  Nonobstructive  disease.  Would investigate noncardiac causes of fatigue.  Laboratory Data: High Sensitivity Troponin:   Recent Labs  Lab 12/17/23 1949 12/18/23 0125 12/18/23 0321 12/18/23 0533 12/18/23 0830  TROPONINIHS 81* 127* 136* 143* 155*     Chemistry Recent Labs  Lab 12/17/23 1949 12/18/23 0125  NA 137 136  K 4.1 4.0  CL 102 102  CO2 30 25  GLUCOSE 290* 277*  BUN 18 19  CREATININE 1.54* 1.59*  CALCIUM  8.8* 8.5*  GFRNONAA 45* 43*  ANIONGAP 5 9    No results for input(s): PROT, ALBUMIN , AST, ALT, ALKPHOS, BILITOT in the last 168 hours. Lipids No results for input(s): CHOL, TRIG, HDL, LABVLDL, LDLCALC, CHOLHDL in the last 168 hours.  Hematology Recent Labs  Lab 12/17/23 1758 12/18/23 0125  WBC 4.1 4.0  RBC 3.94* 3.90*  HGB 12.4* 12.4*  HCT 35.6* 35.9*  MCV 90.4 92.1  MCH 31.5 31.8  MCHC 34.8 34.5  RDW 13.2 13.2  PLT 219 168   Thyroid  No results for input(s): TSH, FREET4 in the last 168 hours.  BNP Recent Labs  Lab 12/17/23 1758  BNP 172.6*    DDimer No results for input(s): DDIMER in the last 168 hours.  Radiology/Studies:  CT Head Wo Contrast Result Date: 12/17/2023 EXAM: CT HEAD WITHOUT CONTRAST 12/17/2023 05:47:29 PM TECHNIQUE: CT of the head was performed without the administration of intravenous contrast. Automated exposure control, iterative reconstruction, and/or weight based adjustment of the mA/kV was utilized to reduce the radiation dose to as low as reasonably achievable. COMPARISON: 11/21/2023 CLINICAL HISTORY: Headache, increasing frequency or severity. FINDINGS: BRAIN AND VENTRICLES: No acute hemorrhage. No evidence of acute infarct. Patchy and confluent decreased attenuation throughout deep and periventricular white matter of cerebral hemispheres bilaterally, compatible with chronic microvascular ischemic disease. Chronic left cerebellar infarction. Cerebral ventricle sizes concordant with degree of cerebral volume loss. No  extra-axial collection. No mass effect or midline shift. ORBITS: Bilateral lens replacement. No acute abnormality. SINUSES: Unchanged chronic right sphenoid sinusitis. Complete opacification of the right sphenoid sinus and partial opacification of the left sphenoid sinus. Significant mucosal thickening within the left posterior ethmoid air cells. Additional scattered mucosal thickening in the bilateral ethmoid and maxillary  sinuses. SOFT TISSUES AND SKULL: Similar-appearing left frontal scalp lipoma. No acute soft tissue abnormality. No skull fracture. VASCULATURE: Atherosclerosis of the carotid siphons. IMPRESSION: 1. No acute intracranial abnormality. 2. 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 3. Unchanged chronic right sphenoid sinusitis with additional scattered mucosal thickening in the bilateral ethmoid and maxillary sinuses. Electronically signed by: Donnice Mania MD 12/17/2023 06:05 PM EST RP Workstation: [id number]   DG Chest Port 1 View Result Date: 12/17/2023 CLINICAL DATA:  Short of breath EXAM: PORTABLE CHEST 1 VIEW COMPARISON:  11/21/2023 FINDINGS: Single frontal view of the chest demonstrates an unremarkable cardiac silhouette. There is patchy opacity at the left lateral lung base obscuring the costophrenic angle, consistent with consolidation and/or small effusion. Right chest is clear. No pneumothorax. No acute bony abnormalities. IMPRESSION: 1. Patchy left lower lobe consolidation and/or small left pleural effusion, obscuring the left costophrenic angle. Electronically Signed   By: Ozell Daring M.D.   On: 12/17/2023 17:42   Assessment and Plan:  Elevated troponin level 63 ? 81 ? 127 ? 136 ? 143 ? 155 Presented with weakness, altered mental status, headache Diagnosed with pneumonia --started on azithromycin , ceftriaxone  LHC 05/2017: Nonobstructive disease, patent mid LAD stent Nuclear stress 08/2019: Normal, low risk study, no  ischemia/infarction Echo 11/2023: LVEF 50 to 55%, no RWMA, G1 DD, normal RV systolic function Patient denies any recent or current chest pain Reports no symptoms or limitations with ADLs Given normal LHC in 2019, normal stress test 2021 and recently normal echo with troponin levels in the 300s do not anticipate any ischemic evaluation this admission, will discuss with MD Will order limited echo to assess for wall motion abnormalities  Coronary artery disease s/p DES to mLAD Hyperlipidemia LHC 05/2017: Nonobstructive disease, patent mid LAD stent Nuclear stress 08/2019: Normal, low risk study, no ischemia/infarction Echo 11/2023: LVEF 50 to 55%, no RWMA, G1 DD, normal RV systolic function Continue home Crestor  40 mg daily Not presently on aspirin  due to Eliquis  use for chronic DVT/PE  Hypertension BP well-controlled this admission Continue home losartan  50 mg daily  Per primary Encephalopathy Community-acquired pneumonia Cognitive impairment, dementia, delirium BPH Type 2 diabetes GERD Depression OSA on CPAP History of DVT/PE on Eliquis  History of TIA History of prostate cancer s/p radiation therapy  Risk Assessment/Risk Scores:      For questions or updates, please contact [facility] Please consult [email or web address] for contact info under   Signed, Waddell DELENA Donath, PA-C  12/18/2023 11:21 AM  Patient seen and examined.  Agree with above documentation.  Steve Brown is an 83 year old male with a history of prostate cancer, OSA, CAD status post DES to LAD, DVT/PE, TIA, diabetes, cognitive impairment who we are consulted for evaluation of elevated troponin at the request of Dr. Leotis.  He presented with shortness of breath and weakness and confusion.  Does have baseline cognitive impairment.  Initial vitals notable for BP 167/98, pulse 62, SpO2 100% on room air.  Labs notable for creatinine 1.54, BNP 173, troponin 63 > 81 > 127 > 136 > 143 > 155, hemoglobin 12.4, WC 4.1.   Chest x-ray showed patchy left lower lobe consolidation.  EKG showed sinus rhythm, rate 74, low voltage.  Echo with EF 60 to 65%, normal RV function, mild to moderate tricuspid regurgitation. On exam, patient is alert and oriented, regular rate and rhythm, no murmurs, lungs CTAB, no LE edema or JVD.  For his troponin elevation, he denies any chest pain.  No  wall motion abnormality on echocardiogram.  Presented with acute encephalopathy, suspect due to community-acquired pneumonia.  Started on antibiotics and mental status improved, currently alert and oriented.  Suspect likely demand ischemia in setting of his acute illness.  Can consider ischemic evaluation as outpatients once recovers.  Steve LITTIE Nanas, MD

## 2023-12-18 NOTE — ED Notes (Signed)
 US  to page hospitalist

## 2023-12-18 NOTE — Plan of Care (Signed)

## 2023-12-18 NOTE — Progress Notes (Signed)
 PROGRESS NOTE    Steve Brown  FMW:[id number] DOB: 12-20-1940 DOA: 12/17/2023 PCP: [facility]   Brief Narrative:  This is 83 yrs old Male with PMH significant for dementia, A-fib on Eliquis , history of coronary artery disease status post stents in 2019 presented in the ED with shortness of breath , cough and headache. Patient was found to be confused and only oriented to self which was reportedly not at his baseline.  Patient was hemodynamically stable.  Significant labs in the ED troponin 63, BNP 272, UA negative for infection.  CT head no acute findings.  Chest x-ray shows findings consistent with pneumonia.  Patient was admitted for further evaluation and started on antibiotics for possible pneumonia. Cardiology is consulted for elevated troponins.  Assessment & Plan:   Principal Problem:   Encephalopathy  Acute infectious encephalopathy: Likely community-acquired pneumonia: Patient presented with confusion, headache, cough, shortness of breath. Chest x-ray shows findings consistent with pneumonia. CT head negative for acute findings. Continue empiric antibiotics ceftriaxone  and Zithromax . Continue antitussives. Patient is hemodynamically stable.  Elevated troponins: Likely in the setting of demand ischemia due to pneumonia. Troponins 63> 81> 127> 136> 143> 155. Patient denies any chest pain.  Patient has normal left heart cath in 2019, normal stress test in 2021. Cardiology consult appreciated recommended limited echo to assess for wall motion abnormalities. No plans for any ischemic evaluation.  Atrial fibrillation: Heart rate is well-controlled.  Continue Eliquis .   Depression: Continue bupropion , Lexapro  , Remeron .   Cognitive impairment: Continue donepezil ..   BPH: Continue finasteride , Flomax    Type 2 diabetes: Continue Lantus , RISS.   GERD; Continue Protonix .   Hyperlipidemia: Continue Crestor .   HTN: Continue losartan .   DVT  prophylaxis: SCDs Code Status: Full code Family Communication:No family at bed side Disposition Plan:   Status is: Inpatient Remains inpatient appropriate because: Admitted for infectious and cephalopathy likely due to pneumonia started on antibiotics.    Consultants:  Cardiology  Procedures: Echo  Antimicrobials: Anti-infectives (From admission, onward)    Start     Dose/Rate Route Frequency Ordered Stop   12/18/23 2300  cefTRIAXone  (ROCEPHIN ) 2 g in sodium chloride  0.9 % 100 mL IVPB        2 g 200 mL/hr over 30 Minutes Intravenous Every 24 hours 12/17/23 2208     12/18/23 2200  azithromycin  (ZITHROMAX ) tablet 500 mg        500 mg Oral Daily 12/17/23 2208     12/17/23 2115  cefTRIAXone  (ROCEPHIN ) 2 g in sodium chloride  0.9 % 100 mL IVPB  Status:  Discontinued        2 g 200 mL/hr over 30 Minutes Intravenous  Once 12/17/23 2109 12/17/23 2219   12/17/23 2115  azithromycin  (ZITHROMAX ) 500 mg in sodium chloride  0.9 % 250 mL IVPB        500 mg 250 mL/hr over 60 Minutes Intravenous  Once 12/17/23 2109 12/17/23 2310      Subjective: Patient was seen and examined at bedside.  Overnight events noted. Patient reports feeling much better,  he appears much alert and oriented following commands. Denies any chest pain.  Objective: Vitals:   12/18/23 0815 12/18/23 0830 12/18/23 0845 12/18/23 1020  BP:      Pulse: (!) 57 61 71   Resp: 11 12 13    Temp:    98.1 F (36.7 C)  TempSrc:    Oral  SpO2: 96% 100% 100%     Intake/Output Summary (Last 24 hours) at 12/18/2023  1248 Last data filed at 12/17/2023 2310 Gross per 24 hour  Intake 350 ml  Output --  Net 350 ml   There were no vitals filed for this visit.  Examination:  General exam: Appears calm and comfortable, not in any acute distress. Respiratory system: Clear to auscultation. Respiratory effort normal. RR  16 Cardiovascular system: S1 & S2 heard, Irregular rhythm. No JVD, murmurs, rubs, gallops or clicks.   Gastrointestinal system: Abdomen is nondistended, soft and nontender. Normal bowel sounds heard. Central nervous system: Alert and oriented X 3. No focal neurological deficits. Extremities: No edema, no cyanosis, no clubbing. Skin: No rashes, lesions or ulcers Psychiatry: Judgement and insight appear normal. Mood & affect appropriate.     Data Reviewed: I have personally reviewed following labs and imaging studies  CBC: Recent Labs  Lab 12/17/23 1758 12/18/23 0125  WBC 4.1 4.0  HGB 12.4* 12.4*  HCT 35.6* 35.9*  MCV 90.4 92.1  PLT 219 168   Basic Metabolic Panel: Recent Labs  Lab 12/17/23 1949 12/18/23 0125  NA 137 136  K 4.1 4.0  CL 102 102  CO2 30 25  GLUCOSE 290* 277*  BUN 18 19  CREATININE 1.54* 1.59*  CALCIUM  8.8* 8.5*   GFR: CrCl cannot be calculated (Unknown ideal weight.). Liver Function Tests: No results for input(s): AST, ALT, ALKPHOS, BILITOT, PROT, ALBUMIN  in the last 168 hours. No results for input(s): LIPASE, AMYLASE in the last 168 hours. No results for input(s): AMMONIA in the last 168 hours. Coagulation Profile: No results for input(s): INR, PROTIME in the last 168 hours. Cardiac Enzymes: No results for input(s): CKTOTAL, CKMB, CKMBINDEX, TROPONINI in the last 168 hours. BNP (last 3 results) No results for input(s): PROBNP in the last 8760 hours. HbA1C: No results for input(s): HGBA1C in the last 72 hours. CBG: Recent Labs  Lab 12/17/23 2142 12/18/23 0743 12/18/23 0802 12/18/23 1024  GLUCAP 282* 206* 218* 337*   Lipid Profile: No results for input(s): CHOL, HDL, LDLCALC, TRIG, CHOLHDL, LDLDIRECT in the last 72 hours. Thyroid  Function Tests: No results for input(s): TSH, T4TOTAL, FREET4, T3FREE, THYROIDAB in the last 72 hours. Anemia Panel: No results for input(s): VITAMINB12, FOLATE, FERRITIN, TIBC, IRON, RETICCTPCT in the last 72 hours. Sepsis Labs: No results for  input(s): PROCALCITON, LATICACIDVEN in the last 168 hours.  Recent Results (from the past 240 hours)  Resp panel by RT-PCR (RSV, Flu A&B, Covid) Anterior Nasal Swab     Status: None   Collection Time: 12/17/23  5:09 PM   Specimen: Anterior Nasal Swab  Result Value Ref Range Status   SARS Coronavirus 2 by RT PCR NEGATIVE NEGATIVE Final   Influenza A by PCR NEGATIVE NEGATIVE Final   Influenza B by PCR NEGATIVE NEGATIVE Final    Comment: (NOTE) The Xpert Xpress SARS-CoV-2/FLU/RSV plus assay is intended as an aid in the diagnosis of influenza from Nasopharyngeal swab specimens and should not be used as a sole basis for treatment. Nasal washings and aspirates are unacceptable for Xpert Xpress SARS-CoV-2/FLU/RSV testing.  Fact Sheet for Patients: bloggercourse.com  Fact Sheet for Healthcare Providers: seriousbroker.it  This test is not yet approved or cleared by the [place]  FDA and has been authorized for detection and/or diagnosis of SARS-CoV-2 by FDA under an Emergency Use Authorization (EUA). This EUA will remain in effect (meaning this test can be used) for the duration of the COVID-19 declaration under Section 564(b)(1) of the Act, 21 U.S.C. section 360bbb-3(b)(1), unless the authorization is terminated  or revoked.     Resp Syncytial Virus by PCR NEGATIVE NEGATIVE Final    Comment: (NOTE) Fact Sheet for Patients: bloggercourse.com  Fact Sheet for Healthcare Providers: seriousbroker.it  This test is not yet approved or cleared by the [place]  FDA and has been authorized for detection and/or diagnosis of SARS-CoV-2 by FDA under an Emergency Use Authorization (EUA). This EUA will remain in effect (meaning this test can be used) for the duration of the COVID-19 declaration under Section 564(b)(1) of the Act, 21 U.S.C. section 360bbb-3(b)(1), unless the authorization  is terminated or revoked.  Performed at [facility], [address] [place], [place] [address]   Blood culture (routine x 2)     Status: None (Preliminary result)   Collection Time: 12/17/23  9:09 PM   Specimen: BLOOD  Result Value Ref Range Status   Specimen Description BLOOD LEFT ANTECUBITAL  Final   Special Requests   Final    BOTTLES DRAWN AEROBIC AND ANAEROBIC Blood Culture adequate volume   Culture   Final    NO GROWTH < 12 HOURS Performed at [facility], [address] [place], [place] [address]    Report Status PENDING  Incomplete  Blood culture (routine x 2)     Status: None (Preliminary result)   Collection Time: 12/17/23  9:14 PM   Specimen: BLOOD RIGHT HAND  Result Value Ref Range Status   Specimen Description BLOOD RIGHT HAND  Final   Special Requests   Final    BOTTLES DRAWN AEROBIC AND ANAEROBIC Blood Culture results may not be optimal due to an inadequate volume of blood received in culture bottles   Culture   Final    NO GROWTH < 12 HOURS Performed at [facility], [address], [place], [place] [address]    Report Status PENDING  Incomplete    Radiology Studies: CT Head Wo Contrast Result Date: 12/17/2023 EXAM: CT HEAD WITHOUT CONTRAST 12/17/2023 05:47:29 PM TECHNIQUE: CT of the head was performed without the administration of intravenous contrast. Automated exposure control, iterative reconstruction, and/or weight based adjustment of the mA/kV was utilized to reduce the radiation dose to as low as reasonably achievable. COMPARISON: 11/21/2023 CLINICAL HISTORY: Headache, increasing frequency or severity. FINDINGS: BRAIN AND VENTRICLES: No acute hemorrhage. No evidence of acute infarct. Patchy and confluent decreased attenuation throughout deep and periventricular white matter of cerebral hemispheres bilaterally, compatible with chronic microvascular ischemic disease. Chronic left cerebellar infarction. Cerebral ventricle sizes concordant with  degree of cerebral volume loss. No extra-axial collection. No mass effect or midline shift. ORBITS: Bilateral lens replacement. No acute abnormality. SINUSES: Unchanged chronic right sphenoid sinusitis. Complete opacification of the right sphenoid sinus and partial opacification of the left sphenoid sinus. Significant mucosal thickening within the left posterior ethmoid air cells. Additional scattered mucosal thickening in the bilateral ethmoid and maxillary sinuses. SOFT TISSUES AND SKULL: Similar-appearing left frontal scalp lipoma. No acute soft tissue abnormality. No skull fracture. VASCULATURE: Atherosclerosis of the carotid siphons. IMPRESSION: 1. No acute intracranial abnormality. 2. 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 3. Unchanged chronic right sphenoid sinusitis with additional scattered mucosal thickening in the bilateral ethmoid and maxillary sinuses. Electronically signed by: Donnice Mania MD 12/17/2023 06:05 PM EST RP Workstation: [id number]   DG Chest Port 1 View Result Date: 12/17/2023 CLINICAL DATA:  Short of breath EXAM: PORTABLE CHEST 1 VIEW COMPARISON:  11/21/2023 FINDINGS: Single frontal view of the chest demonstrates an unremarkable cardiac silhouette.  There is patchy opacity at the left lateral lung base obscuring the costophrenic angle, consistent with consolidation and/or small effusion. Right chest is clear. No pneumothorax. No acute bony abnormalities. IMPRESSION: 1. Patchy left lower lobe consolidation and/or small left pleural effusion, obscuring the left costophrenic angle. Electronically Signed   By: Ozell Daring M.D.   On: 12/17/2023 17:42   Scheduled Meds:  apixaban   5 mg Oral BID   azithromycin   500 mg Oral Daily   buPROPion   150 mg Oral Daily   donepezil   10 mg Oral QHS   escitalopram   20 mg Oral q AM   finasteride   5 mg Oral Daily   insulin  aspart  0-24 Units Subcutaneous TID WC   insulin  glargine  25 Units Subcutaneous Daily    losartan   50 mg Oral Daily   pantoprazole   40 mg Oral Daily   pregabalin   300 mg Oral BID   rosuvastatin   40 mg Oral QHS   tamsulosin   0.4 mg Oral QPC supper   Continuous Infusions:  cefTRIAXone  (ROCEPHIN )  IV Stopped (12/17/23 2257)     LOS: 1 day    Time spent: 50 mins    Darcel Dawley, MD [facility]   If 7PM-7AM, please contact night-coverage

## 2023-12-18 NOTE — Inpatient Diabetes Management (Signed)
 Inpatient Diabetes Program Recommendations  AACE/ADA: New Consensus Statement on Inpatient Glycemic Control (2015)  Target Ranges:  Prepandial:   less than 140 mg/dL      Peak postprandial:   less than 180 mg/dL (1-2 hours)      Critically ill patients:  140 - 180 mg/dL   Lab Results  Component Value Date   GLUCAP 337 (H) 12/18/2023   HGBA1C 9.4 (H) 09/29/2023    Review of Glycemic Control  Latest Reference Range & Units 12/17/23 21:42 12/18/23 07:43 12/18/23 08:02 12/18/23 10:24  Glucose-Capillary 70 - 99 mg/dL 717 (H) 793 (H) 781 (H) 337 (H)  (H): Data is abnormally high  Diabetes history: DM2 Outpatient Diabetes medications:  Novolog  5-8 units TID and Tresiba  26 units QD Current orders for Inpatient glycemic control: Novolog  0-24 units TID, Lantus  25 units QD  Inpatient Diabetes Program Recommendations:    Please consider:  Novolog  0-15 units TID  Novolog  5 units TID with meals if he consumes at least 50%  Thank you, Wyvonna Pinal, MSN, CDCES Diabetes Coordinator Inpatient Diabetes Program [phone] (team pager from 8a-5p)

## 2023-12-19 DIAGNOSIS — N179 Acute kidney failure, unspecified: Secondary | ICD-10-CM

## 2023-12-19 DIAGNOSIS — J189 Pneumonia, unspecified organism: Secondary | ICD-10-CM

## 2023-12-19 DIAGNOSIS — G9340 Encephalopathy, unspecified: Secondary | ICD-10-CM | POA: Diagnosis not present

## 2023-12-19 DIAGNOSIS — N189 Chronic kidney disease, unspecified: Secondary | ICD-10-CM | POA: Diagnosis not present

## 2023-12-19 DIAGNOSIS — I10 Essential (primary) hypertension: Secondary | ICD-10-CM | POA: Diagnosis not present

## 2023-12-19 DIAGNOSIS — R7989 Other specified abnormal findings of blood chemistry: Secondary | ICD-10-CM | POA: Diagnosis not present

## 2023-12-19 LAB — GLUCOSE, CAPILLARY
Glucose-Capillary: 128 mg/dL — ABNORMAL HIGH (ref 70–99)
Glucose-Capillary: 211 mg/dL — ABNORMAL HIGH (ref 70–99)
Glucose-Capillary: 271 mg/dL — ABNORMAL HIGH (ref 70–99)
Glucose-Capillary: 304 mg/dL — ABNORMAL HIGH (ref 70–99)
Glucose-Capillary: 315 mg/dL — ABNORMAL HIGH (ref 70–99)
Glucose-Capillary: 316 mg/dL — ABNORMAL HIGH (ref 70–99)
Glucose-Capillary: 77 mg/dL (ref 70–99)

## 2023-12-19 LAB — BASIC METABOLIC PANEL WITH GFR
Anion gap: 8 (ref 5–15)
BUN: 34 mg/dL — ABNORMAL HIGH (ref 8–23)
CO2: 26 mmol/L (ref 22–32)
Calcium: 8.5 mg/dL — ABNORMAL LOW (ref 8.9–10.3)
Chloride: 105 mmol/L (ref 98–111)
Creatinine, Ser: 2.99 mg/dL — ABNORMAL HIGH (ref 0.61–1.24)
GFR, Estimated: 20 mL/min — ABNORMAL LOW (ref >60)
Glucose, Bld: 107 mg/dL — ABNORMAL HIGH (ref 70–99)
Potassium: 3.7 mmol/L (ref 3.5–5.1)
Sodium: 139 mmol/L (ref 135–145)

## 2023-12-19 LAB — PHOSPHORUS: Phosphorus: 4.1 mg/dL (ref 2.5–4.6)

## 2023-12-19 LAB — CBC
HCT: 34.5 % — ABNORMAL LOW (ref 39.0–52.0)
Hemoglobin: 11.8 g/dL — ABNORMAL LOW (ref 13.0–17.0)
MCH: 32.1 pg (ref 26.0–34.0)
MCHC: 34.2 g/dL (ref 30.0–36.0)
MCV: 93.8 fL (ref 80.0–100.0)
Platelets: 165 K/uL (ref 150–400)
RBC: 3.68 MIL/uL — ABNORMAL LOW (ref 4.22–5.81)
RDW: 13.7 % (ref 11.5–15.5)
WBC: 5 K/uL (ref 4.0–10.5)
nRBC: 0 % (ref 0.0–0.2)

## 2023-12-19 LAB — MAGNESIUM: Magnesium: 1.9 mg/dL (ref 1.7–2.4)

## 2023-12-19 MED ORDER — SODIUM CHLORIDE 0.9 % IV SOLN: INTRAVENOUS | Status: AC

## 2023-12-19 MED ORDER — GUAIFENESIN-DM 100-10 MG/5ML PO SYRP
5.0000 mL | ORAL_SOLUTION | ORAL | Status: DC | PRN
  Administered 2023-12-19 – 2023-12-20 (×2): 5 mL via ORAL
  Filled 2023-12-19 (×2): qty 10

## 2023-12-19 MED ORDER — PREGABALIN 75 MG PO CAPS
75.0000 mg | ORAL_CAPSULE | Freq: Two times a day (BID) | ORAL | Status: DC
  Administered 2023-12-19 – 2023-12-21 (×4): 75 mg via ORAL
  Filled 2023-12-19 (×4): qty 1

## 2023-12-19 NOTE — Plan of Care (Signed)

## 2023-12-19 NOTE — Consult Note (Signed)
 [facility]  HISTORY AND PHYSICAL  Steve Brown is an 83 y.o. male.    Chief Complaint: pneumonia  HPI: Pt is an 14M with a PMH sig for HTN, DM II, h/o CVA/ TIA, and CKD 3b followed by Dr Rayburn who is now seen at the request of Dr Leotis for AKI on CKD 3b.  Looks like pt was admitted 12/17/23 with PNA. Receiving CAP coverage.  Was at baseline of 1.5 on admission, then received losartan  12/9, and now Cr up to 2.99.  UA bland.  In this setting we are asked to see.  Pt reports Dr C asking him to d/c losartan  about 3 months ago- not sure if his dtr did so or not- she fills his pillbox.    No NSAIDS.  Feels well and wants to go home.  In this setting we are asked to see.  PMH: Past Medical History:  Diagnosis Date   Abnormal prostate biopsy    Anticoagulant long-term use    currently xarelto    Anxiety    BPH with elevated PSA    CKD (chronic kidney disease), stage II    Complication of anesthesia    limted neck rom limited use of left arm due to cva   Coronary artery disease    CARDIOLOGIST-  DR DANN--  2010-- PCI w/ stenting midLAD   DDD (degenerative disc disease), lumbar    Degeneration of cervical intervertebral disc    Depression    Diabetes mellitus without complication (HCC)    Type 2   Dyspnea on exertion    GERD (gastroesophageal reflux disease)    Hemiparesis due to cerebral infarction    History of cerebrovascular accident (CVA) with residual deficit 2002 and 2003--  hemiparisis both sides   per MRI  anterior left frontal lobe, left para midline pons, and inferior cerebullam bilaterally infarcts   History of pulmonary embolus (PE)    06-30-2012  extensive bilaterally   History of recurrent TIAs    History of syncope    hx multiple pre-syncope and syncopal episodes due to vasovagal, orthostatic hypotension, dehydration   History of TIAs    several since 2002   Hyperlipidemia    Hypertension    Mild atherosclerosis of carotid artery,  bilateral    per last duplex 11-04-2014  bilateral ICA 1--39%   Neuropathy    fingers   OSA on CPAP    uses most nights; followed by dr dohmeier--  sev. osa w/ AHI 65.9   Pneumonia    x 1   Prostate cancer (HCC) dx 2018   Renal insufficiency    S/P coronary artery stent placement 2010   stenting to mid LAD   Sensorineural hearing loss, bilateral 04/01/2021   no hearing aids   Simple renal cyst    bilaterally   Stroke [facility])    Trigger finger of both hands 11/17/2013   Type 2 diabetes mellitus (HCC) dx 1986   last one A1c 9.2 on 04-26-2016   Unsteady gait    uses straight cane and occasional walker. Hx prior CVA/TIAs;   Vertebral artery occlusion, left    chronic   PSH: Past Surgical History:  Procedure Laterality Date   ANTERIOR CERVICAL DECOMP/DISCECTOMY FUSION  2004   C3 -- C6 limited rom   ARTERY BIOPSY Right 04/21/2022   Procedure: BIOPSY TEMPORAL ARTERY;  Surgeon: Gretta Lonni PARAS, MD;  Location: [facility] OR;  Service: Vascular;  Laterality: Right;   CARDIAC CATHETERIZATION  06-10-2010  dr dann   wide patent LAD stent, mid lesion at the origin of the septal prior to the previous stent 40-50%/  normal LVF, ef 55%   CARDIOVASCULAR STRESS TEST  10-23-2012  dr dann   normal nuclear perfusion study w/ no ischemia/  normal LV function and wall motion , ef 65%   CARPAL TUNNEL RELEASE Bilateral    CATARACT EXTRACTION W/ INTRAOCULAR LENS  IMPLANT, BILATERAL     CHOLECYSTECTOMY N/A 11/02/2015   Procedure: LAPAROSCOPIC CHOLECYSTECTOMY WITH INTRAOPERATIVE CHOLANGIOGRAM;  Surgeon: Donnice Lima, MD;  Location: [facility] OR;  Service: General;  Laterality: N/A;   COLONOSCOPY     CORONARY ANGIOPLASTY WITH STENT PLACEMENT  02/2008   stenting to mid LAD   GOLD SEED IMPLANT N/A 11/15/2016   Procedure: GOLD SEED IMPLANT TIMES THREE;  Surgeon: Cam Morene ORN, MD;  Location: [facility];  Service: Urology;  Laterality: N/A;   IR ANGIO INTRA EXTRACRAN SEL COM CAROTID  INNOMINATE BILAT MOD SED  06/13/2018   IR ANGIO VERTEBRAL SEL VERTEBRAL UNI R MOD SED  06/13/2018   IR US  GUIDE VASC ACCESS RIGHT  06/13/2018   LEFT HEART CATH AND CORONARY ANGIOGRAPHY N/A 05/25/2017   Procedure: LEFT HEART CATH AND CORONARY ANGIOGRAPHY;  Surgeon: Dann Candyce RAMAN, MD;  Location: [facility] INVASIVE CV LAB;  Service: Cardiovascular;  Laterality: N/A;   LEFT HEART CATHETERIZATION WITH CORONARY ANGIOGRAM N/A 04/03/2013   Procedure: LEFT HEART CATHETERIZATION WITH CORONARY ANGIOGRAM;  Surgeon: Candyce RAMAN Dann, MD;  Location: [facility] CATH LAB;  Service: Cardiovascular;  Laterality: N/A;  patent mLAD stent  w/ mild disease in remainder LAD and its branches;  mod. focal lesion midLCFx- FFR of lesion was negative for ischemia/  normal LVSF, ef 50%   lungs  2005   fluid pumped off lungs   NEUROPLASTY / TRANSPOSITION ULNAR NERVE AT ELBOW Right 2004   PROSTATE BIOPSY N/A 08/31/2016   Procedure: PROSTATE  BIOPSY TRANSRECTAL ULTRASONIC PROSTATE (TUBP);  Surgeon: Cam Morene ORN, MD;  Location: [facility];  Service: Urology;  Laterality: N/A;   SPACE OAR INSTILLATION N/A 11/15/2016   Procedure: SPACE OAR INSTILLATION;  Surgeon: Cam Morene ORN, MD;  Location: [facility];  Service: Urology;  Laterality: N/A;   TRANSTHORACIC ECHOCARDIOGRAM  04/27/2016   severe focal basal LVH, ef 60-65%,  grade 2 diastoilc dysfunction/  mild AR, MR, and TR/  atrial septum lipomatous hypertrophy/  PASP   UMBILICAL HERNIA REPAIR      Past Medical History:  Diagnosis Date   Abnormal prostate biopsy    Anticoagulant long-term use    currently xarelto    Anxiety    BPH with elevated PSA    CKD (chronic kidney disease), stage II    Complication of anesthesia    limted neck rom limited use of left arm due to cva   Coronary artery disease    CARDIOLOGIST-  DR DANN--  2010-- PCI w/ stenting midLAD   DDD (degenerative disc disease), lumbar    Degeneration of cervical  intervertebral disc    Depression    Diabetes mellitus without complication (HCC)    Type 2   Dyspnea on exertion    GERD (gastroesophageal reflux disease)    Hemiparesis due to cerebral infarction    History of cerebrovascular accident (CVA) with residual deficit 2002 and 2003--  hemiparisis both sides   per MRI  anterior left frontal lobe, left para midline pons, and inferior cerebullam bilaterally infarcts   History  of pulmonary embolus (PE)    06-30-2012  extensive bilaterally   History of recurrent TIAs    History of syncope    hx multiple pre-syncope and syncopal episodes due to vasovagal, orthostatic hypotension, dehydration   History of TIAs    several since 2002   Hyperlipidemia    Hypertension    Mild atherosclerosis of carotid artery, bilateral    per last duplex 11-04-2014  bilateral ICA 1--39%   Neuropathy    fingers   OSA on CPAP    uses most nights; followed by dr dohmeier--  sev. osa w/ AHI 65.9   Pneumonia    x 1   Prostate cancer (HCC) dx 2018   Renal insufficiency    S/P coronary artery stent placement 2010   stenting to mid LAD   Sensorineural hearing loss, bilateral 04/01/2021   no hearing aids   Simple renal cyst    bilaterally   Stroke [facility])    Trigger finger of both hands 11/17/2013   Type 2 diabetes mellitus (HCC) dx 1986   last one A1c 9.2 on 04-26-2016   Unsteady gait    uses straight cane and occasional walker. Hx prior CVA/TIAs;   Vertebral artery occlusion, left    chronic    Medications:  Scheduled:  apixaban   5 mg Oral BID   azithromycin   500 mg Oral Daily   buPROPion   150 mg Oral Daily   donepezil   10 mg Oral QHS   escitalopram   20 mg Oral q AM   finasteride   5 mg Oral Daily   insulin  aspart  0-6 Units Subcutaneous Q4H   pantoprazole   40 mg Oral Daily   pregabalin   75 mg Oral BID   rosuvastatin   40 mg Oral QHS   tamsulosin   0.4 mg Oral QPC supper    Medications Prior to Admission  Medication Sig Dispense Refill   apixaban   (ELIQUIS ) 5 MG TABS tablet Take 5 mg by mouth 2 (two) times daily.     buPROPion  (WELLBUTRIN  XL) 150 MG 24 hr tablet Take 150 mg by mouth daily.     [Paused] cephALEXin  (KEFLEX ) 250 MG capsule Take 250 mg by mouth at bedtime.     Cholecalciferol  (VITAMIN D -3 PO) Take 1 tablet by mouth daily.     diclofenac  Sodium (VOLTAREN ) 1 % GEL Apply 4 g topically 4 (four) times daily. (Patient taking differently: Apply 4 g topically 4 (four) times daily as needed.) 100 g 0   donepezil  (ARICEPT ) 10 MG tablet Take 10 mg by mouth daily.     escitalopram  (LEXAPRO ) 20 MG tablet Take 20 mg by mouth in the morning.     finasteride  (PROSCAR ) 5 MG tablet Take 5 mg by mouth daily.     furosemide  (LASIX ) 20 MG tablet Take 1-2 tablets (20-40 mg total) by mouth daily as needed for fluid. (Patient taking differently: Take 20 mg by mouth daily.) 30 tablet 0   HYDROcodone -acetaminophen  (NORCO/VICODIN) 5-325 MG tablet Take 1 tablet by mouth every 6 (six) hours as needed for severe pain (pain score 7-10). 10 tablet 0   insulin  aspart (NOVOLOG ) 100 UNIT/ML FlexPen Inject 5 Units into the skin 3 (three) times daily with meals. (Patient taking differently: Inject 5-8 Units into the skin 3 (three) times daily before meals.) 15 mL 0   ketoconazole  (NIZORAL ) 2 % cream Apply 1 Application topically 2 (two) times daily as needed (skin irritation).     lidocaine  (LIDODERM ) 5 % Place 1 patch onto the  skin daily. Remove & Discard patch within 12 hours or as directed by MD (Patient taking differently: Place 1 patch onto the skin daily as needed (pain).) 30 patch 0   lidocaine  (XYLOCAINE ) 5 % ointment Apply 1 Application topically every 4 (four) hours as needed (pain).     losartan  (COZAAR ) 25 MG tablet Take 25 mg by mouth daily.     methocarbamol  (ROBAXIN ) 500 MG tablet Take 1-2 tablets (500-1,000 mg total) by mouth every 8 (eight) hours as needed for muscle spasms. 20 tablet 0   mirtazapine  (REMERON ) 15 MG tablet Take 7.5 mg by mouth at  bedtime as needed (sleep, anxiety, nightmares).     Multiple Vitamins-Minerals (MULTIVITAMIN MEN 50+) TABS Take 1 tablet by mouth daily.     pantoprazole  (PROTONIX ) 40 MG tablet Take 40 mg by mouth daily.     pregabalin  (LYRICA ) 300 MG capsule Take 300 mg by mouth 2 (two) times daily.     rosuvastatin  (CRESTOR ) 40 MG tablet Take 40 mg by mouth daily.     tamsulosin  (FLOMAX ) 0.4 MG CAPS capsule Take 1 capsule (0.4 mg total) by mouth daily after supper. 30 capsule 11   TRESIBA  FLEXTOUCH 100 UNIT/ML FlexTouch Pen Inject 34 Units into the skin daily. (Patient taking differently: Inject 26 Units into the skin daily.) 3 mL 0   valACYclovir  (VALTREX ) 500 MG tablet Take 500 mg by mouth daily.     fluticasone  (FLONASE ) 50 MCG/ACT nasal spray Place 2 sprays into both nostrils daily as needed for allergies or rhinitis.      ALLERGIES:   Allergies  Allergen Reactions   Other Other (See Comments)    Per patient- cardiac cath dye-  woke up during procedure hysterical.   Phenergan  [Promethazine ] Other (See Comments)    Syncope  Mood changes   Topamax [Topiramate] Other (See Comments)    Hallucinations    Ultram  [Tramadol ] Other (See Comments)    Greatly increased blood sugar    FAM HX: Family History  Problem Relation Age of Onset   Aneurysm Mother    Cancer Father        unknown either pancreatic or prostate   Dementia Sister    Stroke Brother    Stroke Daughter    Seizures Daughter    Multiple sclerosis Granddaughter    Heart attack Neg Hx    Sleep apnea Neg Hx     Social History:   reports that he has never smoked. He has never been exposed to tobacco smoke. He has never used smokeless tobacco. He reports that he does not currently use alcohol. He reports that he does not use drugs.  ROS: ROS: all other systems reviewed and are negative except as per HPI  Blood pressure 124/72, pulse (!) 57, temperature (!) 97.4 F (36.3 C), temperature source Oral, resp. rate 18, weight 78.2  kg, SpO2 100%. PHYSICAL EXAM: Physical Exam Gen nad Heent eomi perrl Neck no jvd Pulm clear Cv RRR Abd soft Extno LE edema Neuro AAO x 3 nonfocal   Results for orders placed or performed during the hospital encounter of 12/17/23 (from the past 48 hours)  Resp panel by RT-PCR (RSV, Flu A&B, Covid) Anterior Nasal Swab     Status: None   Collection Time: 12/17/23  5:09 PM   Specimen: Anterior Nasal Swab  Result Value Ref Range   SARS Coronavirus 2 by RT PCR NEGATIVE NEGATIVE   Influenza A by PCR NEGATIVE NEGATIVE   Influenza B by PCR NEGATIVE  NEGATIVE    Comment: (NOTE) The Xpert Xpress SARS-CoV-2/FLU/RSV plus assay is intended as an aid in the diagnosis of influenza from Nasopharyngeal swab specimens and should not be used as a sole basis for treatment. Nasal washings and aspirates are unacceptable for Xpert Xpress SARS-CoV-2/FLU/RSV testing.  Fact Sheet for Patients: bloggercourse.com  Fact Sheet for Healthcare Providers: seriousbroker.it  This test is not yet approved or cleared by the [place]  FDA and has been authorized for detection and/or diagnosis of SARS-CoV-2 by FDA under an Emergency Use Authorization (EUA). This EUA will remain in effect (meaning this test can be used) for the duration of the COVID-19 declaration under Section 564(b)(1) of the Act, 21 U.S.C. section 360bbb-3(b)(1), unless the authorization is terminated or revoked.     Resp Syncytial Virus by PCR NEGATIVE NEGATIVE    Comment: (NOTE) Fact Sheet for Patients: bloggercourse.com  Fact Sheet for Healthcare Providers: seriousbroker.it  This test is not yet approved or cleared by the [place]  FDA and has been authorized for detection and/or diagnosis of SARS-CoV-2 by FDA under an Emergency Use Authorization (EUA). This EUA will remain in effect (meaning this test can be used) for the  duration of the COVID-19 declaration under Section 564(b)(1) of the Act, 21 U.S.C. section 360bbb-3(b)(1), unless the authorization is terminated or revoked.  Performed at [facility], [address] [place], [place] [address]   CBC     Status: Abnormal   Collection Time: 12/17/23  5:58 PM  Result Value Ref Range   WBC 4.1 4.0 - 10.5 K/uL   RBC 3.94 (L) 4.22 - 5.81 MIL/uL   Hemoglobin 12.4 (L) 13.0 - 17.0 g/dL   HCT 64.3 (L) 60.9 - 47.9 %   MCV 90.4 80.0 - 100.0 fL   MCH 31.5 26.0 - 34.0 pg   MCHC 34.8 30.0 - 36.0 g/dL   RDW 86.7 88.4 - 84.4 %   Platelets 219 150 - 400 K/uL   nRBC 0.0 0.0 - 0.2 %    Comment: Performed at [facility], [address] [place], [place] [address]  Troponin I (High Sensitivity)     Status: Abnormal   Collection Time: 12/17/23  5:58 PM  Result Value Ref Range   Troponin I (High Sensitivity) 63 (H) <18 ng/L    Comment: (NOTE) Elevated high sensitivity troponin I (hsTnI) values and significant  changes across serial measurements may suggest ACS but many other  chronic and acute conditions are known to elevate hsTnI results.  Refer to the Links section for chest pain algorithms and additional  guidance. Performed at [facility], [address] [place], [place] [address]   Brain natriuretic peptide     Status: Abnormal   Collection Time: 12/17/23  5:58 PM  Result Value Ref Range   B Natriuretic Peptide 172.6 (H) 0.0 - 100.0 pg/mL    Comment: Performed at [facility], [address], [place], [place] [address]  Urinalysis, Routine w reflex microscopic -Urine, Clean Catch     Status: Abnormal   Collection Time: 12/17/23  6:56 PM  Result Value Ref Range   Color, Urine YELLOW YELLOW   APPearance CLEAR CLEAR   Specific Gravity, Urine 1.013 1.005 - 1.030   pH 7.0 5.0 - 8.0   Glucose, UA >=500 (A) NEGATIVE mg/dL   Hgb urine dipstick NEGATIVE NEGATIVE   Bilirubin Urine NEGATIVE NEGATIVE   Ketones, ur 20 (A) NEGATIVE  mg/dL   Protein, ur  30 (A) NEGATIVE mg/dL   Nitrite NEGATIVE NEGATIVE   Leukocytes,Ua NEGATIVE NEGATIVE   RBC / HPF 0-5 0 - 5 RBC/hpf   WBC, UA 0-5 0 - 5 WBC/hpf   Bacteria, UA RARE (A) NONE SEEN   Squamous Epithelial / HPF 0-5 0 - 5 /HPF   Hyaline Casts, UA PRESENT     Comment: Performed at [facility], [address] [place], [place] [address]  Troponin I (High Sensitivity)     Status: Abnormal   Collection Time: 12/17/23  7:49 PM  Result Value Ref Range   Troponin I (High Sensitivity) 81 (H) <18 ng/L    Comment: (NOTE) Elevated high sensitivity troponin I (hsTnI) values and significant  changes across serial measurements may suggest ACS but many other  chronic and acute conditions are known to elevate hsTnI results.  Refer to the Links section for chest pain algorithms and additional  guidance. Performed at [facility], [address] [place], [place] [address]   Basic metabolic panel with GFR     Status: Abnormal   Collection Time: 12/17/23  7:49 PM  Result Value Ref Range   Sodium 137 135 - 145 mmol/L   Potassium 4.1 3.5 - 5.1 mmol/L   Chloride 102 98 - 111 mmol/L   CO2 30 22 - 32 mmol/L   Glucose, Bld 290 (H) 70 - 99 mg/dL    Comment: Glucose reference range applies only to samples taken after fasting for at least 8 hours.   BUN 18 8 - 23 mg/dL   Creatinine, Ser 8.45 (H) 0.61 - 1.24 mg/dL   Calcium  8.8 (L) 8.9 - 10.3 mg/dL   GFR, Estimated 45 (L) >60 mL/min    Comment: (NOTE) Calculated using the CKD-EPI Creatinine Equation (2021)    Anion gap 5 5 - 15    Comment: Performed at [facility], [address], [place], [place] [address]  Blood culture (routine x 2)     Status: None (Preliminary result)   Collection Time: 12/17/23  9:09 PM   Specimen: BLOOD  Result Value Ref Range   Specimen Description BLOOD LEFT ANTECUBITAL    Special Requests      BOTTLES DRAWN AEROBIC AND ANAEROBIC Blood Culture adequate volume   Culture      NO GROWTH 2  DAYS Performed at [facility] Lab, [address] [place], [place] [address]    Report Status PENDING   Blood culture (routine x 2)     Status: None (Preliminary result)   Collection Time: 12/17/23  9:14 PM   Specimen: BLOOD RIGHT HAND  Result Value Ref Range   Specimen Description BLOOD RIGHT HAND    Special Requests      BOTTLES DRAWN AEROBIC AND ANAEROBIC Blood Culture results may not be optimal due to an inadequate volume of blood received in culture bottles   Culture      NO GROWTH 2 DAYS Performed at [facility], [address] [place], [place] [address]    Report Status PENDING   CBG monitoring, ED     Status: Abnormal   Collection Time: 12/17/23  9:42 PM  Result Value Ref Range   Glucose-Capillary 282 (H) 70 - 99 mg/dL    Comment: Glucose reference range applies only to samples taken after fasting for at least 8 hours.   Comment 1 Notify RN    Comment 2 Document in Chart   CBC  Status: Abnormal   Collection Time: 12/18/23  1:25 AM  Result Value Ref Range   WBC 4.0 4.0 - 10.5 K/uL   RBC 3.90 (L) 4.22 - 5.81 MIL/uL   Hemoglobin 12.4 (L) 13.0 - 17.0 g/dL   HCT 64.0 (L) 60.9 - 47.9 %   MCV 92.1 80.0 - 100.0 fL   MCH 31.8 26.0 - 34.0 pg   MCHC 34.5 30.0 - 36.0 g/dL   RDW 86.7 88.4 - 84.4 %   Platelets 168 150 - 400 K/uL   nRBC 0.0 0.0 - 0.2 %    Comment: Performed at [facility], [address] [place], [place] [address]  Basic metabolic panel     Status: Abnormal   Collection Time: 12/18/23  1:25 AM  Result Value Ref Range   Sodium 136 135 - 145 mmol/L   Potassium 4.0 3.5 - 5.1 mmol/L   Chloride 102 98 - 111 mmol/L   CO2 25 22 - 32 mmol/L   Glucose, Bld 277 (H) 70 - 99 mg/dL    Comment: Glucose reference range applies only to samples taken after fasting for at least 8 hours.   BUN 19 8 - 23 mg/dL   Creatinine, Ser 8.40 (H) 0.61 - 1.24 mg/dL   Calcium  8.5 (L) 8.9 - 10.3 mg/dL   GFR, Estimated 43 (L) >60 mL/min    Comment: (NOTE) Calculated  using the CKD-EPI Creatinine Equation (2021)    Anion gap 9 5 - 15    Comment: Performed at [facility], [address] [place], [place] [address]  Troponin I (High Sensitivity)     Status: Abnormal   Collection Time: 12/18/23  1:25 AM  Result Value Ref Range   Troponin I (High Sensitivity) 127 (HH) <18 ng/L    Comment: CRITICAL RESULT CALLED TO, READ BACK BY AND VERIFIED WITH M. Vannie, RN 12/18/23 0204 by JINNY Grippe (NOTE) Elevated high sensitivity troponin I (hsTnI) values and significant  changes across serial measurements may suggest ACS but many other  chronic and acute conditions are known to elevate hsTnI results.  Refer to the Links section for chest pain algorithms and additional  guidance. Performed at [facility], [address] [place], [place] [address]   Troponin I (High Sensitivity)     Status: Abnormal   Collection Time: 12/18/23  3:21 AM  Result Value Ref Range   Troponin I (High Sensitivity) 136 (HH) <18 ng/L    Comment: CRITICAL VALUE NOTED. VALUE IS CONSISTENT WITH PREVIOUSLY REPORTED/CALLED VALUE (NOTE) Elevated high sensitivity troponin I (hsTnI) values and significant  changes across serial measurements may suggest ACS but many other  chronic and acute conditions are known to elevate hsTnI results.  Refer to the Links section for chest pain algorithms and additional  guidance. Performed at [facility], [address] [place], [place] [address]   Troponin I (High Sensitivity)     Status: Abnormal   Collection Time: 12/18/23  5:33 AM  Result Value Ref Range   Troponin I (High Sensitivity) 143 (HH) <18 ng/L    Comment: CRITICAL VALUE NOTED. VALUE IS CONSISTENT WITH PREVIOUSLY REPORTED/CALLED VALUE (NOTE) Elevated high sensitivity troponin I (hsTnI) values and significant  changes across serial measurements may suggest ACS but many other  chronic and acute conditions are known to elevate hsTnI results.  Refer to the Links section  for chest pain algorithms and additional  guidance. Performed at [facility], [address], [place], [place] [address]   CBG monitoring, ED     Status: Abnormal   Collection Time: 12/18/23  7:43 AM  Result Value Ref Range   Glucose-Capillary 206 (H) 70 - 99 mg/dL    Comment: Glucose reference range applies only to samples taken after fasting for at least 8 hours.   Comment 1 QC Due   CBG monitoring, ED     Status: Abnormal   Collection Time: 12/18/23  8:02 AM  Result Value Ref Range   Glucose-Capillary 218 (H) 70 - 99 mg/dL    Comment: Glucose reference range applies only to samples taken after fasting for at least 8 hours.  Troponin I (High Sensitivity)     Status: Abnormal   Collection Time: 12/18/23  8:30 AM  Result Value Ref Range   Troponin I (High Sensitivity) 155 (HH) <18 ng/L    Comment: CRITICAL VALUE NOTED. VALUE IS CONSISTENT WITH PREVIOUSLY REPORTED/CALLED VALUE (NOTE) Elevated high sensitivity troponin I (hsTnI) values and significant  changes across serial measurements may suggest ACS but many other  chronic and acute conditions are known to elevate hsTnI results.  Refer to the Links section for chest pain algorithms and additional  guidance. Performed at [facility], [address] [place], [place] [address]   CBG monitoring, ED     Status: Abnormal   Collection Time: 12/18/23 10:24 AM  Result Value Ref Range   Glucose-Capillary 337 (H) 70 - 99 mg/dL    Comment: Glucose reference range applies only to samples taken after fasting for at least 8 hours.  CBG monitoring, ED     Status: Abnormal   Collection Time: 12/18/23 12:46 PM  Result Value Ref Range   Glucose-Capillary 304 (H) 70 - 99 mg/dL    Comment: Glucose reference range applies only to samples taken after fasting for at least 8 hours.  CBG monitoring, ED     Status: Abnormal   Collection Time: 12/18/23  4:49 PM  Result Value Ref Range   Glucose-Capillary 60 (L) 70 - 99 mg/dL     Comment: Glucose reference range applies only to samples taken after fasting for at least 8 hours.  Glucose, capillary     Status: Abnormal   Collection Time: 12/18/23  8:45 PM  Result Value Ref Range   Glucose-Capillary 68 (L) 70 - 99 mg/dL    Comment: Glucose reference range applies only to samples taken after fasting for at least 8 hours.  Glucose, capillary     Status: Abnormal   Collection Time: 12/18/23  9:38 PM  Result Value Ref Range   Glucose-Capillary 151 (H) 70 - 99 mg/dL    Comment: Glucose reference range applies only to samples taken after fasting for at least 8 hours.  Glucose, capillary     Status: Abnormal   Collection Time: 12/19/23 12:06 AM  Result Value Ref Range   Glucose-Capillary 211 (H) 70 - 99 mg/dL    Comment: Glucose reference range applies only to samples taken after fasting for at least 8 hours.  CBC     Status: Abnormal   Collection Time: 12/19/23  3:39 AM  Result Value Ref Range   WBC 5.0 4.0 - 10.5 K/uL   RBC 3.68 (L) 4.22 - 5.81 MIL/uL   Hemoglobin 11.8 (L) 13.0 - 17.0 g/dL   HCT 65.4 (L) 60.9 - 47.9 %   MCV 93.8 80.0 - 100.0 fL   MCH 32.1 26.0 - 34.0 pg   MCHC 34.2  30.0 - 36.0 g/dL   RDW 86.2 88.4 - 84.4 %   Platelets 165 150 - 400 K/uL   nRBC 0.0 0.0 - 0.2 %    Comment: Performed at [facility], [address] [place], [place] [address]  Magnesium     Status: None   Collection Time: 12/19/23  3:39 AM  Result Value Ref Range   Magnesium 1.9 1.7 - 2.4 mg/dL    Comment: Performed at [facility], [address], [place], [place] [address]  Basic metabolic panel with GFR     Status: Abnormal   Collection Time: 12/19/23  3:39 AM  Result Value Ref Range   Sodium 139 135 - 145 mmol/L   Potassium 3.7 3.5 - 5.1 mmol/L   Chloride 105 98 - 111 mmol/L   CO2 26 22 - 32 mmol/L   Glucose, Bld 107 (H) 70 - 99 mg/dL    Comment: Glucose reference range applies only to samples taken after fasting for at least 8 hours.   BUN 34 (H) 8 - 23 mg/dL    Creatinine, Ser 7.00 (H) 0.61 - 1.24 mg/dL    Comment: DELTA CHECK NOTED   Calcium  8.5 (L) 8.9 - 10.3 mg/dL   GFR, Estimated 20 (L) >60 mL/min    Comment: (NOTE) Calculated using the CKD-EPI Creatinine Equation (2021)    Anion gap 8 5 - 15    Comment: Performed at [facility], [address], [place], [place] [address]  Phosphorus     Status: None   Collection Time: 12/19/23  3:39 AM  Result Value Ref Range   Phosphorus 4.1 2.5 - 4.6 mg/dL    Comment: Performed at [facility], [address] [place], [place] [address]  Glucose, capillary     Status: None   Collection Time: 12/19/23  4:45 AM  Result Value Ref Range   Glucose-Capillary 77 70 - 99 mg/dL    Comment: Glucose reference range applies only to samples taken after fasting for at least 8 hours.  Glucose, capillary     Status: Abnormal   Collection Time: 12/19/23  8:05 AM  Result Value Ref Range   Glucose-Capillary 128 (H) 70 - 99 mg/dL    Comment: Glucose reference range applies only to samples taken after fasting for at least 8 hours.  Glucose, capillary     Status: Abnormal   Collection Time: 12/19/23 12:43 PM  Result Value Ref Range   Glucose-Capillary 271 (H) 70 - 99 mg/dL    Comment: Glucose reference range applies only to samples taken after fasting for at least 8 hours.    ECHOCARDIOGRAM LIMITED Result Date: 12/18/2023    ECHOCARDIOGRAM LIMITED REPORT   Patient Name:   Renzo Vincelette Date of Exam: 12/18/2023 Medical Rec #:  [id number]        Height:       65.0 in Accession #:    [id number]       Weight:       170.4 lb Date of Birth:  [birth year]         BSA:          1.848 m Patient Age:    83 years         BP:           138/90 mmHg Patient Gender: M                HR:  65 bpm. Exam Location:  Inpatient Procedure: Limited Echo, Cardiac Doppler and Color Doppler (Both Spectral and            Color Flow Doppler were utilized during procedure). Indications:    Elevated Troponin  History:         Patient has prior history of Echocardiogram examinations, most                 recent 11/22/2023. CAD, TIA and Stroke; Risk                 Factors:Hypertension, Diabetes, Sleep Apnea and Dyslipidemia.                 CKD.  Sonographer:    Philomena Daring Referring Phys: [facility] PARCELLS IMPRESSIONS  1. Left ventricular ejection fraction, by estimation, is 60 to 65%. The left ventricle has normal function. The left ventricle has no regional wall motion abnormalities. There is mild concentric left ventricular hypertrophy.  2. Right ventricular systolic function is normal. The right ventricular size is normal.  3. The mitral valve is normal in structure. No evidence of mitral valve regurgitation. No evidence of mitral stenosis.  4. Tricuspid valve regurgitation is mild to moderate.  5. The aortic valve is normal in structure. Aortic valve regurgitation is trivial. No aortic stenosis is present.  6. The inferior vena cava is normal in size with greater than 50% respiratory variability, suggesting right atrial pressure of 3 mmHg. FINDINGS  Left Ventricle: Left ventricular ejection fraction, by estimation, is 60 to 65%. The left ventricle has normal function. The left ventricle has no regional wall motion abnormalities. The left ventricular internal cavity size was normal in size. There is  mild concentric left ventricular hypertrophy. Right Ventricle: The right ventricular size is normal. No increase in right ventricular wall thickness. Right ventricular systolic function is normal. Left Atrium: Left atrial size was normal in size. Right Atrium: Right atrial size was normal in size. Pericardium: There is no evidence of pericardial effusion. Mitral Valve: The mitral valve is normal in structure. No evidence of mitral valve stenosis. Tricuspid Valve: The tricuspid valve is normal in structure. Tricuspid valve regurgitation is mild to moderate. No evidence of tricuspid stenosis. Aortic Valve: The aortic valve is normal in  structure. Aortic valve regurgitation is trivial. No aortic stenosis is present. Pulmonic Valve: The pulmonic valve was normal in structure. Pulmonic valve regurgitation is not visualized. No evidence of pulmonic stenosis. Aorta: The aortic root is normal in size and structure. Venous: The inferior vena cava is normal in size with greater than 50% respiratory variability, suggesting right atrial pressure of 3 mmHg. IAS/Shunts: No atrial level shunt detected by color flow Doppler. Additional Comments: Spectral Doppler performed. Color Doppler performed.  LEFT VENTRICLE PLAX 2D LVIDd:         3.60 cm   Diastology LVIDs:         2.40 cm   LV e' medial:    6.42 cm/s LV PW:         1.20 cm   LV E/e' medial:  8.2 LV IVS:        1.20 cm   LV e' lateral:   6.31 cm/s LVOT diam:     2.00 cm   LV E/e' lateral: 8.3 LV SV:         65 LV SV Index:   35 LVOT Area:     3.14 cm  RIGHT VENTRICLE  IVC RV S prime:     13.20 cm/s  IVC diam: 1.20 cm TAPSE (M-mode): 1.7 cm LEFT ATRIUM         Index LA diam:    2.70 cm 1.46 cm/m  AORTIC VALVE LVOT Vmax:   104.00 cm/s LVOT Vmean:  70.700 cm/s LVOT VTI:    0.207 m  AORTA Ao Root diam: 2.70 cm Ao Asc diam:  3.40 cm MITRAL VALVE               TRICUSPID VALVE MV Area (PHT): 3.42 cm    TR Peak grad:   27.0 mmHg MV Decel Time: 222 msec    TR Vmax:        260.00 cm/s MV E velocity: 52.40 cm/s MV A velocity: 66.60 cm/s  SHUNTS MV E/A ratio:  0.79        Systemic VTI:  0.21 m                            Systemic Diam: 2.00 cm Kardie Tobb DO Electronically signed by Dub Huntsman DO Signature Date/Time: 12/18/2023/3:41:28 PM    Final    CT Head Wo Contrast Result Date: 12/17/2023 EXAM: CT HEAD WITHOUT CONTRAST 12/17/2023 05:47:29 PM TECHNIQUE: CT of the head was performed without the administration of intravenous contrast. Automated exposure control, iterative reconstruction, and/or weight based adjustment of the mA/kV was utilized to reduce the radiation dose to as low as reasonably  achievable. COMPARISON: 11/21/2023 CLINICAL HISTORY: Headache, increasing frequency or severity. FINDINGS: BRAIN AND VENTRICLES: No acute hemorrhage. No evidence of acute infarct. Patchy and confluent decreased attenuation throughout deep and periventricular white matter of cerebral hemispheres bilaterally, compatible with chronic microvascular ischemic disease. Chronic left cerebellar infarction. Cerebral ventricle sizes concordant with degree of cerebral volume loss. No extra-axial collection. No mass effect or midline shift. ORBITS: Bilateral lens replacement. No acute abnormality. SINUSES: Unchanged chronic right sphenoid sinusitis. Complete opacification of the right sphenoid sinus and partial opacification of the left sphenoid sinus. Significant mucosal thickening within the left posterior ethmoid air cells. Additional scattered mucosal thickening in the bilateral ethmoid and maxillary sinuses. SOFT TISSUES AND SKULL: Similar-appearing left frontal scalp lipoma. No acute soft tissue abnormality. No skull fracture. VASCULATURE: Atherosclerosis of the carotid siphons. IMPRESSION: 1. No acute intracranial abnormality. 2. 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 3. Unchanged chronic right sphenoid sinusitis with additional scattered mucosal thickening in the bilateral ethmoid and maxillary sinuses. Electronically signed by: Donnice Mania MD 12/17/2023 06:05 PM EST RP Workstation: [id number]   DG Chest Port 1 View Result Date: 12/17/2023 CLINICAL DATA:  Short of breath EXAM: PORTABLE CHEST 1 VIEW COMPARISON:  11/21/2023 FINDINGS: Single frontal view of the chest demonstrates an unremarkable cardiac silhouette. There is patchy opacity at the left lateral lung base obscuring the costophrenic angle, consistent with consolidation and/or small effusion. Right chest is clear. No pneumothorax. No acute bony abnormalities. IMPRESSION: 1. Patchy left lower lobe consolidation and/or  small left pleural effusion, obscuring the left costophrenic angle. Electronically Signed   By: Ozell Daring M.D.   On: 12/17/2023 17:42    Assessment/Plan  AKI on CKD 3b:  seems to be hemodynamically mediated  - will do renal US  just in case  - will give a small fluid bolus  - hold losartan   - likely will improve with time  - no indication for HD  2.  HTN:  - hold losartan   3.  CAP:  - getting antibiotics  4.  Dispo: pending, possibly tomorrow  GEARLINE NORRIS 12/19/2023, 2:33 PM

## 2023-12-19 NOTE — Progress Notes (Signed)
 Transition of Care [facility]) - Inpatient Brief Assessment   Patient Details  Name: Steve Brown MRN: [id number] Date of Birth: August 12, 1940  Transition of Care [facility]) CM/SW Contact:    Rosaline JONELLE Joe, RN Phone Number: 12/19/2023, 3:49 PM   Clinical Narrative: CM met with the patient at the bedside to discuss IP Care management needs.  The patient lives alone and is currently active with [facility].  Patient admitted to the hospital for acute encephalopathy and pneumonia and is currently on RA in the hospital.  DME at the home includes [employer], [employer] and CPAP machine with oxygen (unknown liter flow per patient along with unknown agnecy).  Patient states that he does not use oxygen during the day - only through his CPAP machine with sleep.  [facility] [place] but does not receive [facility] since he is not 70% service connected.  MD requested to place PT consult - pending at this time.  I called [facility] and patient is active with the agency and I will place orders to be co-signed by the MD once active services are determined to continue home health services.  Patient states that his family will likely provide transportation to home when Medically stable - by car to home.   Transition of Care Asessment: Insurance and Status: (P) Insurance coverage has been reviewed Patient has primary care physician: (P) Yes Home environment has been reviewed: (P) from home alone Prior level of function:: (P) self Prior/Current Home Services: (P) Current home services (Active with [facility]) Social Drivers of Health Review: (P) SDOH reviewed interventions complete Readmission risk has been reviewed: (P) Yes Transition of care needs: (P) transition of care needs identified, TOC will continue to follow

## 2023-12-19 NOTE — Progress Notes (Signed)
 Ok to reduce Lyrica  to 75mg  PO BID due to renal impairment per Dr. Leotis.  Sergio Batch, PharmD, BCIDP, AAHIVP, CPP Infectious Disease Pharmacist 12/19/2023 12:48 PM

## 2023-12-19 NOTE — Progress Notes (Signed)
 PROGRESS NOTE    Steve Brown  FMW:[id number] DOB: 1940/09/29 DOA: 12/17/2023 PCP: [facility]   Brief Narrative:  This is 83 yrs old Male with PMH significant for dementia, A-fib on Eliquis , history of coronary artery disease status post stents in 2019 presented in the ED with shortness of breath , cough and headache. Patient was found to be confused and only oriented to self which was reportedly not at his baseline.  Patient was hemodynamically stable.  Significant labs in the ED troponin 63, BNP 272, UA negative for infection.  CT head no acute findings.  Chest x-ray shows findings consistent with pneumonia.  Patient was admitted for further evaluation and started on antibiotics for possible pneumonia. Cardiology is consulted for elevated troponins.  Assessment & Plan:   Principal Problem:   Encephalopathy Active Problems:   Coronary artery disease involving native coronary artery of native heart   Elevated troponin   Pneumonia due to infectious organism  Acute infectious encephalopathy: Likely community-acquired pneumonia: Patient presented with confusion, headache, cough, shortness of breath. Chest x-ray shows findings consistent with pneumonia. CT head negative for acute findings. Continue empiric antibiotics ceftriaxone  and Zithromax . Continue antitussives. Patient is hemodynamically stable. Mental status is improving.  Elevated troponins: Likely in the setting of demand ischemia due to pneumonia. Troponins 63> 81> 127> 136> 143> 155. Patient denies any chest pain.  Patient has normal LHC in 2019, Normal stress test in 2021. Cardiology consult appreciated recommended limited echo to assess for wall motion abnormalities. No plans for any ischemic evaluation. Echocardiogram shows LVEF 60 to 65%, no RWMA.  Atrial fibrillation: Heart rate is well-controlled.  Continue Eliquis .   Depression: Continue bupropion , Lexapro  , Remeron .   Cognitive  impairment: Continue donepezil ..   BPH: Continue finasteride , Flomax    Type 2 diabetes: Continue Lantus , RISS.   GERD; Continue Protonix .   Hyperlipidemia: Continue Crestor .   HTN: Hold losartan  given AKI.  Acute kidney injury on CKD stage IIIa: Baseline serum creatinine remains between 1.3-1.9. Creatinine jumped to 2.99 unknown reason. Nephrology is consulted.  DVT prophylaxis: SCDs Code Status: Full code Family Communication:  family at bed side. Disposition Plan:   Status is: Inpatient Remains inpatient appropriate because: Admitted for infectious Encephalopathy likely due to pneumonia started on antibiotics.    Consultants:  Cardiology  Procedures: Echo  Antimicrobials: Anti-infectives (From admission, onward)    Start     Dose/Rate Route Frequency Ordered Stop   12/18/23 2300  cefTRIAXone  (ROCEPHIN ) 2 g in sodium chloride  0.9 % 100 mL IVPB        2 g 200 mL/hr over 30 Minutes Intravenous Every 24 hours 12/17/23 2208     12/18/23 2200  azithromycin  (ZITHROMAX ) tablet 500 mg        500 mg Oral Daily 12/17/23 2208     12/17/23 2115  cefTRIAXone  (ROCEPHIN ) 2 g in sodium chloride  0.9 % 100 mL IVPB  Status:  Discontinued        2 g 200 mL/hr over 30 Minutes Intravenous  Once 12/17/23 2109 12/17/23 2219   12/17/23 2115  azithromycin  (ZITHROMAX ) 500 mg in sodium chloride  0.9 % 250 mL IVPB        500 mg 250 mL/hr over 60 Minutes Intravenous  Once 12/17/23 2109 12/17/23 2310      Subjective: Patient was seen and examined at bedside.  Overnight events noted. Patient reports feeling much better, He appears much alert and oriented following commands. Denies any chest pain.  Objective: Vitals:  12/19/23 0004 12/19/23 0443 12/19/23 0808 12/19/23 1245  BP: 115/65 139/79 125/66 124/72  Pulse: (!) 57 (!) 57 (!) 56 (!) 57  Resp: 20 20 18 18   Temp: 98 F (36.7 C) (!) 97.3 F (36.3 C) 98 F (36.7 C) (!) 97.4 F (36.3 C)  TempSrc: Oral Oral  Oral  SpO2: 97%  100% 100% 100%    Intake/Output Summary (Last 24 hours) at 12/19/2023 1410 Last data filed at 12/19/2023 1112 Gross per 24 hour  Intake 480 ml  Output --  Net 480 ml   There were no vitals filed for this visit.  Examination:  General exam: Appears calm and comfortable, not in any acute distress. Respiratory system: CTA bilaterally . Respiratory effort normal. RR  14 Cardiovascular system: S1 & S2 heard, Irregular rhythm. No JVD, murmurs, rubs, gallops or clicks.  Gastrointestinal system: Abdomen is nondistended, soft and nontender. Normal bowel sounds heard. Central nervous system: Alert and oriented X 3. No focal neurological deficits. Extremities: No edema, no cyanosis, no clubbing. Skin: No rashes, lesions or ulcers Psychiatry: Judgement and insight appear normal. Mood & affect appropriate.     Data Reviewed: I have personally reviewed following labs and imaging studies  CBC: Recent Labs  Lab 12/17/23 1758 12/18/23 0125 12/19/23 0339  WBC 4.1 4.0 5.0  HGB 12.4* 12.4* 11.8*  HCT 35.6* 35.9* 34.5*  MCV 90.4 92.1 93.8  PLT 219 168 165   Basic Metabolic Panel: Recent Labs  Lab 12/17/23 1949 12/18/23 0125 12/19/23 0339  NA 137 136 139  K 4.1 4.0 3.7  CL 102 102 105  CO2 30 25 26   GLUCOSE 290* 277* 107*  BUN 18 19 34*  CREATININE 1.54* 1.59* 2.99*  CALCIUM  8.8* 8.5* 8.5*  MG  --   --  1.9  PHOS  --   --  4.1   GFR: CrCl cannot be calculated (Unknown ideal weight.). Liver Function Tests: No results for input(s): AST, ALT, ALKPHOS, BILITOT, PROT, ALBUMIN  in the last 168 hours. No results for input(s): LIPASE, AMYLASE in the last 168 hours. No results for input(s): AMMONIA in the last 168 hours. Coagulation Profile: No results for input(s): INR, PROTIME in the last 168 hours. Cardiac Enzymes: No results for input(s): CKTOTAL, CKMB, CKMBINDEX, TROPONINI in the last 168 hours. BNP (last 3 results) No results for input(s):  PROBNP in the last 8760 hours. HbA1C: No results for input(s): HGBA1C in the last 72 hours. CBG: Recent Labs  Lab 12/18/23 2138 12/19/23 0006 12/19/23 0445 12/19/23 0805 12/19/23 1243  GLUCAP 151* 211* 77 128* 271*   Lipid Profile: No results for input(s): CHOL, HDL, LDLCALC, TRIG, CHOLHDL, LDLDIRECT in the last 72 hours. Thyroid  Function Tests: No results for input(s): TSH, T4TOTAL, FREET4, T3FREE, THYROIDAB in the last 72 hours. Anemia Panel: No results for input(s): VITAMINB12, FOLATE, FERRITIN, TIBC, IRON, RETICCTPCT in the last 72 hours. Sepsis Labs: No results for input(s): PROCALCITON, LATICACIDVEN in the last 168 hours.  Recent Results (from the past 240 hours)  Resp panel by RT-PCR (RSV, Flu A&B, Covid) Anterior Nasal Swab     Status: None   Collection Time: 12/17/23  5:09 PM   Specimen: Anterior Nasal Swab  Result Value Ref Range Status   SARS Coronavirus 2 by RT PCR NEGATIVE NEGATIVE Final   Influenza A by PCR NEGATIVE NEGATIVE Final   Influenza B by PCR NEGATIVE NEGATIVE Final    Comment: (NOTE) The Xpert Xpress SARS-CoV-2/FLU/RSV plus assay is intended as an aid  in the diagnosis of influenza from Nasopharyngeal swab specimens and should not be used as a sole basis for treatment. Nasal washings and aspirates are unacceptable for Xpert Xpress SARS-CoV-2/FLU/RSV testing.  Fact Sheet for Patients: bloggercourse.com  Fact Sheet for Healthcare Providers: seriousbroker.it  This test is not yet approved or cleared by the [place]  FDA and has been authorized for detection and/or diagnosis of SARS-CoV-2 by FDA under an Emergency Use Authorization (EUA). This EUA will remain in effect (meaning this test can be used) for the duration of the COVID-19 declaration under Section 564(b)(1) of the Act, 21 U.S.C. section 360bbb-3(b)(1), unless the authorization is terminated  or revoked.     Resp Syncytial Virus by PCR NEGATIVE NEGATIVE Final    Comment: (NOTE) Fact Sheet for Patients: bloggercourse.com  Fact Sheet for Healthcare Providers: seriousbroker.it  This test is not yet approved or cleared by the [place]  FDA and has been authorized for detection and/or diagnosis of SARS-CoV-2 by FDA under an Emergency Use Authorization (EUA). This EUA will remain in effect (meaning this test can be used) for the duration of the COVID-19 declaration under Section 564(b)(1) of the Act, 21 U.S.C. section 360bbb-3(b)(1), unless the authorization is terminated or revoked.  Performed at [facility], [address], [place], [place] [address]   Blood culture (routine x 2)     Status: None (Preliminary result)   Collection Time: 12/17/23  9:09 PM   Specimen: BLOOD  Result Value Ref Range Status   Specimen Description BLOOD LEFT ANTECUBITAL  Final   Special Requests   Final    BOTTLES DRAWN AEROBIC AND ANAEROBIC Blood Culture adequate volume   Culture   Final    NO GROWTH 2 DAYS Performed at [facility], [address] [place], [place] [address]    Report Status PENDING  Incomplete  Blood culture (routine x 2)     Status: None (Preliminary result)   Collection Time: 12/17/23  9:14 PM   Specimen: BLOOD RIGHT HAND  Result Value Ref Range Status   Specimen Description BLOOD RIGHT HAND  Final   Special Requests   Final    BOTTLES DRAWN AEROBIC AND ANAEROBIC Blood Culture results may not be optimal due to an inadequate volume of blood received in culture bottles   Culture   Final    NO GROWTH 2 DAYS Performed at [facility], [address], [place], [place] [address]    Report Status PENDING  Incomplete    Radiology Studies: ECHOCARDIOGRAM LIMITED Result Date: 12/18/2023    ECHOCARDIOGRAM LIMITED REPORT   Patient Name:   Ayman Brull Date of Exam: 12/18/2023 Medical Rec #:   [id number]        Height:       65.0 in Accession #:    [id number]       Weight:       170.4 lb Date of Birth:  09-03-1940         BSA:          1.848 m Patient Age:    83 years         BP:           138/90 mmHg Patient Gender: M                HR:           65 bpm. Exam Location:  Inpatient Procedure: Limited Echo, Cardiac Doppler and Color Doppler (Both Spectral and  Color Flow Doppler were utilized during procedure). Indications:    Elevated Troponin  History:        Patient has prior history of Echocardiogram examinations, most                 recent 11/22/2023. CAD, TIA and Stroke; Risk                 Factors:Hypertension, Diabetes, Sleep Apnea and Dyslipidemia.                 CKD.  Sonographer:    Philomena Daring Referring Phys: [facility] PARCELLS IMPRESSIONS  1. Left ventricular ejection fraction, by estimation, is 60 to 65%. The left ventricle has normal function. The left ventricle has no regional wall motion abnormalities. There is mild concentric left ventricular hypertrophy.  2. Right ventricular systolic function is normal. The right ventricular size is normal.  3. The mitral valve is normal in structure. No evidence of mitral valve regurgitation. No evidence of mitral stenosis.  4. Tricuspid valve regurgitation is mild to moderate.  5. The aortic valve is normal in structure. Aortic valve regurgitation is trivial. No aortic stenosis is present.  6. The inferior vena cava is normal in size with greater than 50% respiratory variability, suggesting right atrial pressure of 3 mmHg. FINDINGS  Left Ventricle: Left ventricular ejection fraction, by estimation, is 60 to 65%. The left ventricle has normal function. The left ventricle has no regional wall motion abnormalities. The left ventricular internal cavity size was normal in size. There is  mild concentric left ventricular hypertrophy. Right Ventricle: The right ventricular size is normal. No increase in right ventricular wall thickness. Right  ventricular systolic function is normal. Left Atrium: Left atrial size was normal in size. Right Atrium: Right atrial size was normal in size. Pericardium: There is no evidence of pericardial effusion. Mitral Valve: The mitral valve is normal in structure. No evidence of mitral valve stenosis. Tricuspid Valve: The tricuspid valve is normal in structure. Tricuspid valve regurgitation is mild to moderate. No evidence of tricuspid stenosis. Aortic Valve: The aortic valve is normal in structure. Aortic valve regurgitation is trivial. No aortic stenosis is present. Pulmonic Valve: The pulmonic valve was normal in structure. Pulmonic valve regurgitation is not visualized. No evidence of pulmonic stenosis. Aorta: The aortic root is normal in size and structure. Venous: The inferior vena cava is normal in size with greater than 50% respiratory variability, suggesting right atrial pressure of 3 mmHg. IAS/Shunts: No atrial level shunt detected by color flow Doppler. Additional Comments: Spectral Doppler performed. Color Doppler performed.  LEFT VENTRICLE PLAX 2D LVIDd:         3.60 cm   Diastology LVIDs:         2.40 cm   LV e' medial:    6.42 cm/s LV PW:         1.20 cm   LV E/e' medial:  8.2 LV IVS:        1.20 cm   LV e' lateral:   6.31 cm/s LVOT diam:     2.00 cm   LV E/e' lateral: 8.3 LV SV:         65 LV SV Index:   35 LVOT Area:     3.14 cm  RIGHT VENTRICLE             IVC RV S prime:     13.20 cm/s  IVC diam: 1.20 cm TAPSE (M-mode): 1.7 cm LEFT ATRIUM  Index LA diam:    2.70 cm 1.46 cm/m  AORTIC VALVE LVOT Vmax:   104.00 cm/s LVOT Vmean:  70.700 cm/s LVOT VTI:    0.207 m  AORTA Ao Root diam: 2.70 cm Ao Asc diam:  3.40 cm MITRAL VALVE               TRICUSPID VALVE MV Area (PHT): 3.42 cm    TR Peak grad:   27.0 mmHg MV Decel Time: 222 msec    TR Vmax:        260.00 cm/s MV E velocity: 52.40 cm/s MV A velocity: 66.60 cm/s  SHUNTS MV E/A ratio:  0.79        Systemic VTI:  0.21 m                             Systemic Diam: 2.00 cm Kardie Tobb DO Electronically signed by Dub Huntsman DO Signature Date/Time: 12/18/2023/3:41:28 PM    Final    CT Head Wo Contrast Result Date: 12/17/2023 EXAM: CT HEAD WITHOUT CONTRAST 12/17/2023 05:47:29 PM TECHNIQUE: CT of the head was performed without the administration of intravenous contrast. Automated exposure control, iterative reconstruction, and/or weight based adjustment of the mA/kV was utilized to reduce the radiation dose to as low as reasonably achievable. COMPARISON: 11/21/2023 CLINICAL HISTORY: Headache, increasing frequency or severity. FINDINGS: BRAIN AND VENTRICLES: No acute hemorrhage. No evidence of acute infarct. Patchy and confluent decreased attenuation throughout deep and periventricular white matter of cerebral hemispheres bilaterally, compatible with chronic microvascular ischemic disease. Chronic left cerebellar infarction. Cerebral ventricle sizes concordant with degree of cerebral volume loss. No extra-axial collection. No mass effect or midline shift. ORBITS: Bilateral lens replacement. No acute abnormality. SINUSES: Unchanged chronic right sphenoid sinusitis. Complete opacification of the right sphenoid sinus and partial opacification of the left sphenoid sinus. Significant mucosal thickening within the left posterior ethmoid air cells. Additional scattered mucosal thickening in the bilateral ethmoid and maxillary sinuses. SOFT TISSUES AND SKULL: Similar-appearing left frontal scalp lipoma. No acute soft tissue abnormality. No skull fracture. VASCULATURE: Atherosclerosis of the carotid siphons. IMPRESSION: 1. No acute intracranial abnormality. 2. 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 3. Unchanged chronic right sphenoid sinusitis with additional scattered mucosal thickening in the bilateral ethmoid and maxillary sinuses. Electronically signed by: Donnice Mania MD 12/17/2023 06:05 PM EST RP Workstation: [id number]   DG  Chest Port 1 View Result Date: 12/17/2023 CLINICAL DATA:  Short of breath EXAM: PORTABLE CHEST 1 VIEW COMPARISON:  11/21/2023 FINDINGS: Single frontal view of the chest demonstrates an unremarkable cardiac silhouette. There is patchy opacity at the left lateral lung base obscuring the costophrenic angle, consistent with consolidation and/or small effusion. Right chest is clear. No pneumothorax. No acute bony abnormalities. IMPRESSION: 1. Patchy left lower lobe consolidation and/or small left pleural effusion, obscuring the left costophrenic angle. Electronically Signed   By: Ozell Daring M.D.   On: 12/17/2023 17:42   Scheduled Meds:  apixaban   5 mg Oral BID   azithromycin   500 mg Oral Daily   buPROPion   150 mg Oral Daily   donepezil   10 mg Oral QHS   escitalopram   20 mg Oral q AM   finasteride   5 mg Oral Daily   insulin  aspart  0-6 Units Subcutaneous Q4H   pantoprazole   40 mg Oral Daily   pregabalin   75 mg Oral BID   rosuvastatin   40 mg Oral QHS   tamsulosin   0.4 mg Oral QPC supper   Continuous Infusions:  cefTRIAXone  (ROCEPHIN )  IV 2 g (12/18/23 2258)     LOS: 2 days    Time spent: 35 mins    Darcel Dawley, MD [facility]   If 7PM-7AM, please contact night-coverage

## 2023-12-19 NOTE — Plan of Care (Signed)
°  Problem: Education: Goal: Knowledge of General Education information will improve Description: Including pain rating scale, medication(s)/side effects and non-pharmacologic comfort measures Outcome: Progressing   Problem: Health Behavior/Discharge Planning: Goal: Ability to manage health-related needs will improve Outcome: Progressing   Problem: Clinical Measurements: Goal: Ability to maintain clinical measurements within normal limits will improve Outcome: Progressing   Problem: Activity: Goal: Risk for activity intolerance will decrease Outcome: Progressing   Problem: Nutrition: Goal: Adequate nutrition will be maintained Outcome: Progressing   Problem: Safety: Goal: Ability to remain free from injury will improve Outcome: Progressing

## 2023-12-19 NOTE — Progress Notes (Signed)
 Rounding Note   Patient Name: Steve Brown Date of Encounter: 12/19/2023  [facility] Cardiologist: Candyce Reek, MD   Subjective Denies any chest pain or dyspnea  Scheduled Meds:  apixaban   5 mg Oral BID   azithromycin   500 mg Oral Daily   buPROPion   150 mg Oral Daily   donepezil   10 mg Oral QHS   escitalopram   20 mg Oral q AM   finasteride   5 mg Oral Daily   insulin  aspart  0-6 Units Subcutaneous Q4H   pantoprazole   40 mg Oral Daily   pregabalin   300 mg Oral BID   rosuvastatin   40 mg Oral QHS   tamsulosin   0.4 mg Oral QPC supper   Continuous Infusions:  cefTRIAXone  (ROCEPHIN )  IV 2 g (12/18/23 2258)   PRN Meds: acetaminophen  **OR** acetaminophen , dextrose , mirtazapine , ondansetron  **OR** ondansetron  (ZOFRAN ) IV, oxyCODONE    Vital Signs  Vitals:   12/18/23 2102 12/19/23 0004 12/19/23 0443 12/19/23 0808  BP: 98/60 115/65 139/79 125/66  Pulse:  (!) 57 (!) 57 (!) 56  Resp:  20 20 18   Temp:  98 F (36.7 C) (!) 97.3 F (36.3 C) 98 F (36.7 C)  TempSrc:  Oral Oral   SpO2:  97% 100% 100%    Intake/Output Summary (Last 24 hours) at 12/19/2023 1211 Last data filed at 12/19/2023 1112 Gross per 24 hour  Intake 480 ml  Output --  Net 480 ml      11/23/2023    4:13 AM 11/18/2023    2:30 PM 11/10/2023    4:54 PM  Last 3 Weights  Weight (lbs) 170 lb 6.7 oz 184 lb 15.5 oz 184 lb 15.5 oz  Weight (kg) 77.3 kg 83.9 kg 83.9 kg      Telemetry Normal sinus rhythm- Personally Reviewed  ECG  No new ECG- Personally Reviewed  Physical Exam  GEN: No acute distress.   Neck: No JVD Cardiac: RRR, no murmurs, rubs, or gallops.  Respiratory: Clear to auscultation bilaterally. GI: Soft, nontender, non-distended  MS: No edema; No deformity. Neuro:  Nonfocal  Psych: Normal affect   Labs High Sensitivity Troponin:   Recent Labs  Lab 12/17/23 1949 12/18/23 0125 12/18/23 0321 12/18/23 0533 12/18/23 0830  TROPONINIHS 81* 127* 136* 143* 155*      Chemistry Recent Labs  Lab 12/17/23 1949 12/18/23 0125 12/19/23 0339  NA 137 136 139  K 4.1 4.0 3.7  CL 102 102 105  CO2 30 25 26   GLUCOSE 290* 277* 107*  BUN 18 19 34*  CREATININE 1.54* 1.59* 2.99*  CALCIUM  8.8* 8.5* 8.5*  MG  --   --  1.9  GFRNONAA 45* 43* 20*  ANIONGAP 5 9 8     Lipids No results for input(s): CHOL, TRIG, HDL, LABVLDL, LDLCALC, CHOLHDL in the last 168 hours.  Hematology Recent Labs  Lab 12/17/23 1758 12/18/23 0125 12/19/23 0339  WBC 4.1 4.0 5.0  RBC 3.94* 3.90* 3.68*  HGB 12.4* 12.4* 11.8*  HCT 35.6* 35.9* 34.5*  MCV 90.4 92.1 93.8  MCH 31.5 31.8 32.1  MCHC 34.8 34.5 34.2  RDW 13.2 13.2 13.7  PLT 219 168 165   Thyroid  No results for input(s): TSH, FREET4 in the last 168 hours.  BNP Recent Labs  Lab 12/17/23 1758  BNP 172.6*    DDimer No results for input(s): DDIMER in the last 168 hours.   Radiology  ECHOCARDIOGRAM LIMITED Result Date: 12/18/2023    ECHOCARDIOGRAM LIMITED REPORT   Patient Name:  Steve Brown Date of Exam: 12/18/2023 Medical Rec #:  [id number]        Height:       65.0 in Accession #:    [id number]       Weight:       170.4 lb Date of Birth:  Jun 01, 1940         BSA:          1.848 m Patient Age:    83 years         BP:           138/90 mmHg Patient Gender: M                HR:           65 bpm. Exam Location:  Inpatient Procedure: Limited Echo, Cardiac Doppler and Color Doppler (Both Spectral and            Color Flow Doppler were utilized during procedure). Indications:    Elevated Troponin  History:        Patient has prior history of Echocardiogram examinations, most                 recent 11/22/2023. CAD, TIA and Stroke; Risk                 Factors:Hypertension, Diabetes, Sleep Apnea and Dyslipidemia.                 CKD.  Sonographer:    Philomena Daring Referring Phys: WADDELL LABOR PARCELLS IMPRESSIONS  1. Left ventricular ejection fraction, by estimation, is 60 to 65%. The left ventricle has normal function. The  left ventricle has no regional wall motion abnormalities. There is mild concentric left ventricular hypertrophy.  2. Right ventricular systolic function is normal. The right ventricular size is normal.  3. The mitral valve is normal in structure. No evidence of mitral valve regurgitation. No evidence of mitral stenosis.  4. Tricuspid valve regurgitation is mild to moderate.  5. The aortic valve is normal in structure. Aortic valve regurgitation is trivial. No aortic stenosis is present.  6. The inferior vena cava is normal in size with greater than 50% respiratory variability, suggesting right atrial pressure of 3 mmHg. FINDINGS  Left Ventricle: Left ventricular ejection fraction, by estimation, is 60 to 65%. The left ventricle has normal function. The left ventricle has no regional wall motion abnormalities. The left ventricular internal cavity size was normal in size. There is  mild concentric left ventricular hypertrophy. Right Ventricle: The right ventricular size is normal. No increase in right ventricular wall thickness. Right ventricular systolic function is normal. Left Atrium: Left atrial size was normal in size. Right Atrium: Right atrial size was normal in size. Pericardium: There is no evidence of pericardial effusion. Mitral Valve: The mitral valve is normal in structure. No evidence of mitral valve stenosis. Tricuspid Valve: The tricuspid valve is normal in structure. Tricuspid valve regurgitation is mild to moderate. No evidence of tricuspid stenosis. Aortic Valve: The aortic valve is normal in structure. Aortic valve regurgitation is trivial. No aortic stenosis is present. Pulmonic Valve: The pulmonic valve was normal in structure. Pulmonic valve regurgitation is not visualized. No evidence of pulmonic stenosis. Aorta: The aortic root is normal in size and structure. Venous: The inferior vena cava is normal in size with greater than 50% respiratory variability, suggesting right atrial pressure of 3  mmHg. IAS/Shunts: No atrial level shunt detected by color flow Doppler. Additional Comments: Spectral Doppler performed.  Color Doppler performed.  LEFT VENTRICLE PLAX 2D LVIDd:         3.60 cm   Diastology LVIDs:         2.40 cm   LV e' medial:    6.42 cm/s LV PW:         1.20 cm   LV E/e' medial:  8.2 LV IVS:        1.20 cm   LV e' lateral:   6.31 cm/s LVOT diam:     2.00 cm   LV E/e' lateral: 8.3 LV SV:         65 LV SV Index:   35 LVOT Area:     3.14 cm  RIGHT VENTRICLE             IVC RV S prime:     13.20 cm/s  IVC diam: 1.20 cm TAPSE (M-mode): 1.7 cm LEFT ATRIUM         Index LA diam:    2.70 cm 1.46 cm/m  AORTIC VALVE LVOT Vmax:   104.00 cm/s LVOT Vmean:  70.700 cm/s LVOT VTI:    0.207 m  AORTA Ao Root diam: 2.70 cm Ao Asc diam:  3.40 cm MITRAL VALVE               TRICUSPID VALVE MV Area (PHT): 3.42 cm    TR Peak grad:   27.0 mmHg MV Decel Time: 222 msec    TR Vmax:        260.00 cm/s MV E velocity: 52.40 cm/s MV A velocity: 66.60 cm/s  SHUNTS MV E/A ratio:  0.79        Systemic VTI:  0.21 m                            Systemic Diam: 2.00 cm Kardie Tobb DO Electronically signed by Dub Huntsman DO Signature Date/Time: 12/18/2023/3:41:28 PM    Final    CT Head Wo Contrast Result Date: 12/17/2023 EXAM: CT HEAD WITHOUT CONTRAST 12/17/2023 05:47:29 PM TECHNIQUE: CT of the head was performed without the administration of intravenous contrast. Automated exposure control, iterative reconstruction, and/or weight based adjustment of the mA/kV was utilized to reduce the radiation dose to as low as reasonably achievable. COMPARISON: 11/21/2023 CLINICAL HISTORY: Headache, increasing frequency or severity. FINDINGS: BRAIN AND VENTRICLES: No acute hemorrhage. No evidence of acute infarct. Patchy and confluent decreased attenuation throughout deep and periventricular white matter of cerebral hemispheres bilaterally, compatible with chronic microvascular ischemic disease. Chronic left cerebellar infarction. Cerebral  ventricle sizes concordant with degree of cerebral volume loss. No extra-axial collection. No mass effect or midline shift. ORBITS: Bilateral lens replacement. No acute abnormality. SINUSES: Unchanged chronic right sphenoid sinusitis. Complete opacification of the right sphenoid sinus and partial opacification of the left sphenoid sinus. Significant mucosal thickening within the left posterior ethmoid air cells. Additional scattered mucosal thickening in the bilateral ethmoid and maxillary sinuses. SOFT TISSUES AND SKULL: Similar-appearing left frontal scalp lipoma. No acute soft tissue abnormality. No skull fracture. VASCULATURE: Atherosclerosis of the carotid siphons. IMPRESSION: 1. No acute intracranial abnormality. 2. 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 3. Unchanged chronic right sphenoid sinusitis with additional scattered mucosal thickening in the bilateral ethmoid and maxillary sinuses. Electronically signed by: Donnice Mania MD 12/17/2023 06:05 PM EST RP Workstation: [id number]   DG Chest Port 1 View Result Date: 12/17/2023 CLINICAL DATA:  Short of breath EXAM: PORTABLE CHEST 1 VIEW  COMPARISON:  11/21/2023 FINDINGS: Single frontal view of the chest demonstrates an unremarkable cardiac silhouette. There is patchy opacity at the left lateral lung base obscuring the costophrenic angle, consistent with consolidation and/or small effusion. Right chest is clear. No pneumothorax. No acute bony abnormalities. IMPRESSION: 1. Patchy left lower lobe consolidation and/or small left pleural effusion, obscuring the left costophrenic angle. Electronically Signed   By: Ozell Daring M.D.   On: 12/17/2023 17:42    Cardiac Studies   Patient Profile   83 y.o. male with a hx of prostate cancer s/p radiation therapy in 2018, OSA on CPAP, hypertension, hyperlipidemia, CAD s/p DES to LAD, history of DVT and PE on Eliquis , history of TIA, diabetes, cognitive impairment, BPH,  GERD, depression who is being seen 12/18/2023 for the evaluation of elevated troponin levels   Assessment & Plan   Elevated troponin level 63 ? 81 ? 127 ? 136 ? 143 ? 155 Presented with weakness, altered mental status, headache Diagnosed with pneumonia --started on azithromycin , ceftriaxone  LHC 05/2017: Nonobstructive disease, patent mid LAD stent Nuclear stress 08/2019: Normal, low risk study, no ischemia/infarction Echo 11/2023: LVEF 50 to 55%, no RWMA, G1 DD, normal RV systolic function Patient denies any recent or current chest pain Reports no symptoms or limitations with ADLs Denies any anginal symptoms.  Echo shows no wall motion abnormalities.  Suspect demand ischemia in setting of acute illness.  Can plan outpatient ischemic evaluation once recovers from acute illness  AKI on CKD Creatinine 1.5 on admission, appears about baseline.  Significant worsening of creatinine today, up to 2.99.  Unclear etiology, workup per primary team  coronary artery disease s/p DES to mLAD Hyperlipidemia LHC 05/2017: Nonobstructive disease, patent mid LAD stent Nuclear stress 08/2019: Normal, low risk study, no ischemia/infarction Echo 11/2023: LVEF 50 to 55%, no RWMA, G1 DD, normal RV systolic function Continue home Crestor  40 mg daily Not presently on aspirin  due to Eliquis  use for chronic DVT/PE   Hypertension BP well-controlled this admission Hold home losartan  given AKI as above   Per primary Encephalopathy Community-acquired pneumonia Cognitive impairment, dementia, delirium BPH Type 2 diabetes GERD Depression OSA on CPAP History of DVT/PE on Eliquis  History of TIA History of prostate cancer s/p radiation therapy  [facility] will sign off.   Medication Recommendations: No changes Other recommendations (labs, testing, etc): None Follow up as an outpatient: Will schedule  For questions or updates, please contact [facility] Please consult [email or web address] for  contact info under    Signed, Lonni LITTIE Nanas, MD  12/19/2023, 12:11 PM

## 2023-12-20 DIAGNOSIS — G9340 Encephalopathy, unspecified: Secondary | ICD-10-CM | POA: Diagnosis not present

## 2023-12-20 LAB — BASIC METABOLIC PANEL WITH GFR
Anion gap: 10 (ref 5–15)
BUN: 34 mg/dL — ABNORMAL HIGH (ref 8–23)
CO2: 23 mmol/L (ref 22–32)
Calcium: 8.5 mg/dL — ABNORMAL LOW (ref 8.9–10.3)
Chloride: 105 mmol/L (ref 98–111)
Creatinine, Ser: 2.13 mg/dL — ABNORMAL HIGH (ref 0.61–1.24)
GFR, Estimated: 31 mL/min — ABNORMAL LOW (ref >60)
Glucose, Bld: 150 mg/dL — ABNORMAL HIGH (ref 70–99)
Potassium: 4.4 mmol/L (ref 3.5–5.1)
Sodium: 138 mmol/L (ref 135–145)

## 2023-12-20 LAB — GLUCOSE, CAPILLARY
Glucose-Capillary: 100 mg/dL — ABNORMAL HIGH (ref 70–99)
Glucose-Capillary: 207 mg/dL — ABNORMAL HIGH (ref 70–99)
Glucose-Capillary: 231 mg/dL — ABNORMAL HIGH (ref 70–99)
Glucose-Capillary: 250 mg/dL — ABNORMAL HIGH (ref 70–99)
Glucose-Capillary: 296 mg/dL — ABNORMAL HIGH (ref 70–99)
Glucose-Capillary: 92 mg/dL (ref 70–99)

## 2023-12-20 MED ORDER — INSULIN ASPART 100 UNIT/ML IJ SOLN
2.0000 [IU] | Freq: Three times a day (TID) | INTRAMUSCULAR | Status: DC
  Administered 2023-12-20 – 2023-12-21 (×2): 2 [IU] via SUBCUTANEOUS
  Filled 2023-12-20 (×2): qty 2

## 2023-12-20 MED ORDER — INSULIN ASPART 100 UNIT/ML IJ SOLN
0.0000 [IU] | Freq: Three times a day (TID) | INTRAMUSCULAR | Status: DC
  Administered 2023-12-20 – 2023-12-21 (×2): 3 [IU] via SUBCUTANEOUS
  Filled 2023-12-20: qty 6
  Filled 2023-12-20: qty 3

## 2023-12-20 MED ORDER — INSULIN GLARGINE 100 UNIT/ML ~~LOC~~ SOLN
8.0000 [IU] | SUBCUTANEOUS | Status: DC
  Administered 2023-12-20: 8 [IU] via SUBCUTANEOUS
  Filled 2023-12-20 (×2): qty 0.08

## 2023-12-20 NOTE — Inpatient Diabetes Management (Signed)
 Inpatient Diabetes Program Recommendations  AACE/ADA: New Consensus Statement on Inpatient Glycemic Control (2015)  Target Ranges:  Prepandial:   less than 140 mg/dL      Peak postprandial:   less than 180 mg/dL (1-2 hours)      Critically ill patients:  140 - 180 mg/dL   Lab Results  Component Value Date   GLUCAP 231 (H) 12/20/2023   HGBA1C 9.4 (H) 09/29/2023    Review of Glycemic Control  Latest Reference Range & Units 12/19/23 19:19 12/19/23 20:16 12/20/23 00:39 12/20/23 05:20 12/20/23 07:54 12/20/23 11:33  Glucose-Capillary 70 - 99 mg/dL 695 (H) 684 (H) 792 (H) 92 100 (H) 231 (H)   Diabetes history: DM 2 Outpatient Diabetes medications:  Novolog  5-8 units tid with meals Tresiba  26 units daily Current orders for Inpatient glycemic control:  Novolog  0-6 units q 4 hours  Inpatient Diabetes Program Recommendations:    Please consider changing Novolog  correction to tid with meals and HS.  Also consider adding Novolog  meal coverage 2-3 units tid with meals and also consider adding Lantus  8 units daily.    Thanks,  Randall Bullocks, RN, BC-ADM Inpatient Diabetes Coordinator Pager [phone]  (8a-5p)

## 2023-12-20 NOTE — Progress Notes (Signed)
 [facility] Progress Note   Assessment/ Plan:   AKI on CKD 3b:  seems to be hemodynamically mediated             - will do renal US  just in case- hasn't been done yet, don't feel strongly about needing it before d/c as Cr has drasticcally improved             - s/p fliuds             - hold losartan  and hold on d/c             - ok to d/c from renal perspective with followup in our clinic   2.  HTN:             - hold losartan    3.  CAP:             - getting antibiotics   4.  Dispo: ok to d/c today  Subjective:    Seen in room.  Cr down to 2.13.  OK for d/c   Objective:   BP 118/66 (BP Location: Left Arm)   Pulse (!) 56   Temp 98 F (36.7 C)   Resp 18   Wt 78.2 kg   SpO2 100%   BMI 28.71 kg/m   Intake/Output Summary (Last 24 hours) at 12/20/2023 1055 Last data filed at 12/20/2023 9676 Gross per 24 hour  Intake 1430.41 ml  Output 150 ml  Net 1280.41 ml   Weight change:   Physical Exam: Gen:NAD CVS RRR Resp: clear Abd: soft Ext: no LE edema  Imaging: ECHOCARDIOGRAM LIMITED Result Date: 12/18/2023    ECHOCARDIOGRAM LIMITED REPORT   Patient Name:   Steve Brown Date of Exam: 12/18/2023 Medical Rec #:  [id number]        Height:       65.0 in Accession #:    [id number]       Weight:       170.4 lb Date of Birth:  08-30-1940         BSA:          1.848 m Patient Age:    83 years         BP:           138/90 mmHg Patient Gender: M                HR:           65 bpm. Exam Location:  Inpatient Procedure: Limited Echo, Cardiac Doppler and Color Doppler (Both Spectral and            Color Flow Doppler were utilized during procedure). Indications:    Elevated Troponin  History:        Patient has prior history of Echocardiogram examinations, most                 recent 11/22/2023. CAD, TIA and Stroke; Risk                 Factors:Hypertension, Diabetes, Sleep Apnea and Dyslipidemia.                 CKD.  Sonographer:    Philomena Daring Referring Phys: WADDELL LABOR  PARCELLS IMPRESSIONS  1. Left ventricular ejection fraction, by estimation, is 60 to 65%. The left ventricle has normal function. The left ventricle has no regional wall motion abnormalities. There is mild concentric left ventricular hypertrophy.  2. Right ventricular systolic function is normal. The  right ventricular size is normal.  3. The mitral valve is normal in structure. No evidence of mitral valve regurgitation. No evidence of mitral stenosis.  4. Tricuspid valve regurgitation is mild to moderate.  5. The aortic valve is normal in structure. Aortic valve regurgitation is trivial. No aortic stenosis is present.  6. The inferior vena cava is normal in size with greater than 50% respiratory variability, suggesting right atrial pressure of 3 mmHg. FINDINGS  Left Ventricle: Left ventricular ejection fraction, by estimation, is 60 to 65%. The left ventricle has normal function. The left ventricle has no regional wall motion abnormalities. The left ventricular internal cavity size was normal in size. There is  mild concentric left ventricular hypertrophy. Right Ventricle: The right ventricular size is normal. No increase in right ventricular wall thickness. Right ventricular systolic function is normal. Left Atrium: Left atrial size was normal in size. Right Atrium: Right atrial size was normal in size. Pericardium: There is no evidence of pericardial effusion. Mitral Valve: The mitral valve is normal in structure. No evidence of mitral valve stenosis. Tricuspid Valve: The tricuspid valve is normal in structure. Tricuspid valve regurgitation is mild to moderate. No evidence of tricuspid stenosis. Aortic Valve: The aortic valve is normal in structure. Aortic valve regurgitation is trivial. No aortic stenosis is present. Pulmonic Valve: The pulmonic valve was normal in structure. Pulmonic valve regurgitation is not visualized. No evidence of pulmonic stenosis. Aorta: The aortic root is normal in size and structure.  Venous: The inferior vena cava is normal in size with greater than 50% respiratory variability, suggesting right atrial pressure of 3 mmHg. IAS/Shunts: No atrial level shunt detected by color flow Doppler. Additional Comments: Spectral Doppler performed. Color Doppler performed.  LEFT VENTRICLE PLAX 2D LVIDd:         3.60 cm   Diastology LVIDs:         2.40 cm   LV e' medial:    6.42 cm/s LV PW:         1.20 cm   LV E/e' medial:  8.2 LV IVS:        1.20 cm   LV e' lateral:   6.31 cm/s LVOT diam:     2.00 cm   LV E/e' lateral: 8.3 LV SV:         65 LV SV Index:   35 LVOT Area:     3.14 cm  RIGHT VENTRICLE             IVC RV S prime:     13.20 cm/s  IVC diam: 1.20 cm TAPSE (M-mode): 1.7 cm LEFT ATRIUM         Index LA diam:    2.70 cm 1.46 cm/m  AORTIC VALVE LVOT Vmax:   104.00 cm/s LVOT Vmean:  70.700 cm/s LVOT VTI:    0.207 m  AORTA Ao Root diam: 2.70 cm Ao Asc diam:  3.40 cm MITRAL VALVE               TRICUSPID VALVE MV Area (PHT): 3.42 cm    TR Peak grad:   27.0 mmHg MV Decel Time: 222 msec    TR Vmax:        260.00 cm/s MV E velocity: 52.40 cm/s MV A velocity: 66.60 cm/s  SHUNTS MV E/A ratio:  0.79        Systemic VTI:  0.21 m  Systemic Diam: 2.00 cm Kardie Tobb DO Electronically signed by Dub Huntsman DO Signature Date/Time: 12/18/2023/3:41:28 PM    Final     Labs: BMET Recent Labs  Lab 12/17/23 1949 12/18/23 0125 12/19/23 0339 12/20/23 0846  NA 137 136 139 138  K 4.1 4.0 3.7 4.4  CL 102 102 105 105  CO2 30 25 26 23   GLUCOSE 290* 277* 107* 150*  BUN 18 19 34* 34*  CREATININE 1.54* 1.59* 2.99* 2.13*  CALCIUM  8.8* 8.5* 8.5* 8.5*  PHOS  --   --  4.1  --    CBC Recent Labs  Lab 12/17/23 1758 12/18/23 0125 12/19/23 0339  WBC 4.1 4.0 5.0  HGB 12.4* 12.4* 11.8*  HCT 35.6* 35.9* 34.5*  MCV 90.4 92.1 93.8  PLT 219 168 165    Medications:     apixaban   5 mg Oral BID   azithromycin   500 mg Oral Daily   buPROPion   150 mg Oral Daily   donepezil   10 mg Oral QHS    escitalopram   20 mg Oral q AM   finasteride   5 mg Oral Daily   insulin  aspart  0-6 Units Subcutaneous Q4H   pantoprazole   40 mg Oral Daily   pregabalin   75 mg Oral BID   rosuvastatin   40 mg Oral QHS   tamsulosin   0.4 mg Oral QPC supper   Almarie Bonine, MD 12/20/2023, 10:55 AM

## 2023-12-20 NOTE — Evaluation (Signed)
 Physical Therapy Evaluation Patient Details Name: Steve Brown MRN: [id number] DOB: Apr 02, 1940 Today's Date: 12/20/2023  History of Present Illness  83 y.o. male presents to [facility] with SOB, AMS, cough, and h/a. Chest x-ray showed findings consistent with PNA. Also with encephalopathy.  Multiple prior admits. PMHx: CVA, TIA, CAD, T2DM, chronic diastolic HF, anxiety and depression, BPH, prostate cancer s/p TURB and seeding, lumbar radiculopathy, s/p neck fusion, chronic back pain, hereditary and idiopathic neuropathy, OSA, history of PE, chronic sinusitis CKD 3A   Clinical Impression  PTA pt would ambulate primarily with SP cane with occasional use of RW for longer distances. Pt presents close to baseline with CGA for bed mobility and to stand with no AD. Pt required x3 attempts to stand before being able to reach upright position. Improvements noticed when using momentum to rock forwards. Able to then ambulate 214ft with supervision with no AD. Pt will have intermittent assist available upon d/c home. Pt has previously had OP PT and would like for another referral. Acute PT to continue to follow to address deficits.         If plan is discharge home, recommend the following: Assist for transportation;Help with stairs or ramp for entrance   Can travel by private vehicle   Yes     Equipment Recommendations None recommended by PT     Functional Status Assessment Patient has had a recent decline in their functional status and demonstrates the ability to make significant improvements in function in a reasonable and predictable amount of time.     Precautions / Restrictions Precautions Precautions: Fall Recall of Precautions/Restrictions: Intact Restrictions Weight Bearing Restrictions Per Provider Order: No      Mobility  Bed Mobility Overal bed mobility: Needs Assistance Bed Mobility: Supine to Sit    Supine to sit: Contact guard, Used rails    General bed mobility comments: Pt  initially reaching for UE support from PT. Cued to roll and push with UE to raise trunk.    Transfers Overall transfer level: Needs assistance Equipment used: None Transfers: Sit to/from Stand Sit to Stand: Contact guard assist    General transfer comment: x3 attempts with pt able to stand with use of momentum and CGA for safety    Ambulation/Gait Ambulation/Gait assistance: Supervision Gait Distance (Feet): 250 Feet Assistive device: None Gait Pattern/deviations: Step-through pattern, Decreased step length - right, Decreased stance time - right Gait velocity: decr    General Gait Details: increased medial/lateral sway with slightly decreased step length on R LE.    Balance Overall balance assessment: Needs assistance Sitting-balance support: No upper extremity supported, Feet supported Sitting balance-Leahy Scale: Good     Standing balance support: No upper extremity supported Standing balance-Leahy Scale: Fair       Pertinent Vitals/Pain Pain Assessment Pain Assessment: No/denies pain    Home Living Family/patient expects to be discharged to:: Private residence Living Arrangements: Alone Available Help at Discharge: Family;Available PRN/intermittently;Friend(s) Type of Home: Apartment Home Access: Stairs to enter Entrance Stairs-Rails: Right Entrance Stairs-Number of Steps: 16   Home Layout: One level Home Equipment: Agricultural Consultant (2 wheels);Cane - single point;Shower seat      Prior Function Prior Level of Function : Independent/Modified Independent;Driving    Mobility Comments: Ind with occasional use of SP cane. RW for longer distances ADLs Comments: Independent. States he manages own meds, pays bills, drives to dr. margurette.     Extremity/Trunk Assessment   Upper Extremity Assessment Upper Extremity Assessment: Defer to OT evaluation  Lower Extremity Assessment Lower Extremity Assessment: Generalized weakness    Cervical / Trunk  Assessment Cervical / Trunk Assessment: Normal  Communication   Communication Communication: No apparent difficulties    Cognition Arousal: Alert Behavior During Therapy: WFL for tasks assessed/performed   PT - Cognitive impairments: No family/caregiver present to determine baseline, Orientation, Safety/Judgement   Orientation impairments: Time    PT - Cognition Comments: Oriented to month and year Following commands: Intact       Cueing Cueing Techniques: Verbal cues      PT Assessment Patient needs continued PT services  PT Problem List Decreased activity tolerance;Decreased balance;Decreased strength;Decreased mobility       PT Treatment Interventions DME instruction;Gait training;Stair training;Functional mobility training;Therapeutic activities;Therapeutic exercise;Balance training;Neuromuscular re-education;Patient/family education    PT Goals (Current goals can be found in the Care Plan section)  Acute Rehab PT Goals Patient Stated Goal: to go home PT Goal Formulation: With patient Time For Goal Achievement: 01/03/24 Potential to Achieve Goals: Good    Frequency Min 1X/week        AM-PAC PT 6 Clicks Mobility  Outcome Measure Help needed turning from your back to your side while in a flat bed without using bedrails?: None Help needed moving from lying on your back to sitting on the side of a flat bed without using bedrails?: A Little Help needed moving to and from a bed to a chair (including a wheelchair)?: A Little Help needed standing up from a chair using your arms (e.g., wheelchair or bedside chair)?: A Little Help needed to walk in hospital room?: A Little Help needed climbing 3-5 steps with a railing? : A Little 6 Click Score: 19    End of Session Equipment Utilized During Treatment: Gait belt Activity Tolerance: Patient tolerated treatment well Patient left: in chair;with call bell/phone within reach;with chair alarm set Nurse Communication:  Mobility status PT Visit Diagnosis: Other abnormalities of gait and mobility (R26.89);Unsteadiness on feet (R26.81)    Time: 8548-8490 PT Time Calculation (min) (ACUTE ONLY): 18 min   Charges:   PT Evaluation $PT Eval Low Complexity: 1 Low   PT General Charges $$ ACUTE PT VISIT: 1 Visit       Kate ORN, PT, DPT Secure Chat Preferred  Rehab Office [phone]   Kate BRAVO Wendolyn 12/20/2023, 4:02 PM

## 2023-12-20 NOTE — Progress Notes (Signed)
 PROGRESS NOTE    Steve Brown  FMW:[id number] DOB: 17-Jan-1940 DOA: 12/17/2023 PCP: [facility]   Brief Narrative:  This is 83 yrs old Male with PMH significant for dementia, A-fib on Eliquis , history of coronary artery disease status post stents in 2019 presented in the ED with shortness of breath , cough and headache. Patient was found to be confused and only oriented to self which was reportedly not at his baseline.  Patient was hemodynamically stable.  Significant labs in the ED troponin 63, BNP 272, UA negative for infection.  CT head no acute findings.  Chest x-ray shows findings consistent with pneumonia.  Patient was admitted for further evaluation and started on antibiotics for possible pneumonia. Cardiology is consulted for elevated troponins.  Assessment & Plan:   Principal Problem:   Encephalopathy Active Problems:   Coronary artery disease involving native coronary artery of native heart   Elevated troponin   Pneumonia due to infectious organism  Acute infectious encephalopathy: Likely community-acquired pneumonia: Patient presented with confusion, headache, cough, shortness of breath. Chest x-ray shows findings consistent with pneumonia. CT head negative for acute findings.  Blood cultures no growth for 3 days. Continue empiric antibiotics ceftriaxone  and Zithromax . Continue antitussives. Patient is hemodynamically stable. Mental status is improving.  Elevated troponins: Likely in the setting of demand ischemia due to pneumonia. Troponins 63> 81> 127> 136> 143> 155. Patient denies any chest pain.  Patient has normal LHC in 2019, Normal stress test in 2021. Cardiology consult appreciated recommended limited echo to assess for wall motion abnormalities. No plans for any ischemic evaluation. Echocardiogram shows LVEF 60 to 65%, no RWMA.  Atrial fibrillation: Heart rate is well-controlled.  Continue Eliquis .   Depression: Continue bupropion , Lexapro  ,  Remeron .   Cognitive impairment: Continue donepezil ..   BPH: Continue finasteride , Flomax    Type 2 diabetes: Continue Lantus , RISS.   GERD; Continue Protonix .   Hyperlipidemia: Continue Crestor .   HTN: Hold losartan  given AKI.  Acute kidney injury on CKD stage IIIa: Baseline serum creatinine remains between 1.3-1.9. Creatinine jumped to 2.99 unknown reason. Nephrology is consulted.  Seems to be hemodynamically mediated. Renal functions improved.  Hold losartan  and hold on discharge.  DVT prophylaxis: SCDs Code Status: Full code Family Communication:  family at bed side. Disposition Plan:   Status is: Inpatient Remains inpatient appropriate because: Admitted for infectious Encephalopathy likely due to pneumonia started on antibiotics.  Renal functions improving. Anticipated discharge home 12/21/2023.    Consultants:  Cardiology  Procedures: Echo  Antimicrobials: Anti-infectives (From admission, onward)    Start     Dose/Rate Route Frequency Ordered Stop   12/18/23 2300  cefTRIAXone  (ROCEPHIN ) 2 g in sodium chloride  0.9 % 100 mL IVPB        2 g 200 mL/hr over 30 Minutes Intravenous Every 24 hours 12/17/23 2208     12/18/23 2200  azithromycin  (ZITHROMAX ) tablet 500 mg        500 mg Oral Daily 12/17/23 2208     12/17/23 2115  cefTRIAXone  (ROCEPHIN ) 2 g in sodium chloride  0.9 % 100 mL IVPB  Status:  Discontinued        2 g 200 mL/hr over 30 Minutes Intravenous  Once 12/17/23 2109 12/17/23 2219   12/17/23 2115  azithromycin  (ZITHROMAX ) 500 mg in sodium chloride  0.9 % 250 mL IVPB        500 mg 250 mL/hr over 60 Minutes Intravenous  Once 12/17/23 2109 12/17/23 2310      Subjective: Patient  was seen and examined at bedside.  Overnight events noted. Patient reports feeling much better, He appears much alert and oriented,  following commands. Denies any chest pain.  Renal  functions are improving.  Objective: Vitals:   12/20/23 0053 12/20/23 0518 12/20/23 0907  12/20/23 1145  BP: (!) 105/56 117/70 118/66 (!) 149/73  Pulse: 67 62 (!) 56 69  Resp:  18 18 18   Temp:  (!) 97.2 F (36.2 C) 98 F (36.7 C)   TempSrc:      SpO2:  100% 100% 100%  Weight:        Intake/Output Summary (Last 24 hours) at 12/20/2023 1429 Last data filed at 12/20/2023 0323 Gross per 24 hour  Intake 1190.41 ml  Output 150 ml  Net 1040.41 ml   Filed Weights   12/19/23 1400  Weight: 78.2 kg    Examination:  General exam: Appears calm and comfortable, not in any acute distress. Respiratory system: CTA bilaterally. Respiratory effort normal. RR  15 Cardiovascular system: S1 & S2 heard, Irregular rhythm. No JVD, murmurs, rubs, gallops or clicks.  Gastrointestinal system: Abdomen is nondistended, soft and nontender. Normal bowel sounds heard. Central nervous system: Alert and oriented X 3. No focal neurological deficits. Extremities: No edema, no cyanosis, no clubbing. Skin: No rashes, lesions or ulcers Psychiatry: Judgement and insight appear normal. Mood & affect appropriate.     Data Reviewed: I have personally reviewed following labs and imaging studies  CBC: Recent Labs  Lab 12/17/23 1758 12/18/23 0125 12/19/23 0339  WBC 4.1 4.0 5.0  HGB 12.4* 12.4* 11.8*  HCT 35.6* 35.9* 34.5*  MCV 90.4 92.1 93.8  PLT 219 168 165   Basic Metabolic Panel: Recent Labs  Lab 12/17/23 1949 12/18/23 0125 12/19/23 0339 12/20/23 0846  NA 137 136 139 138  K 4.1 4.0 3.7 4.4  CL 102 102 105 105  CO2 30 25 26 23   GLUCOSE 290* 277* 107* 150*  BUN 18 19 34* 34*  CREATININE 1.54* 1.59* 2.99* 2.13*  CALCIUM  8.8* 8.5* 8.5* 8.5*  MG  --   --  1.9  --   PHOS  --   --  4.1  --    GFR: Estimated Creatinine Clearance: 26.2 mL/min (A) (by C-G formula based on SCr of 2.13 mg/dL (H)). Liver Function Tests: No results for input(s): AST, ALT, ALKPHOS, BILITOT, PROT, ALBUMIN  in the last 168 hours. No results for input(s): LIPASE, AMYLASE in the last 168  hours. No results for input(s): AMMONIA in the last 168 hours. Coagulation Profile: No results for input(s): INR, PROTIME in the last 168 hours. Cardiac Enzymes: No results for input(s): CKTOTAL, CKMB, CKMBINDEX, TROPONINI in the last 168 hours. BNP (last 3 results) No results for input(s): PROBNP in the last 8760 hours. HbA1C: No results for input(s): HGBA1C in the last 72 hours. CBG: Recent Labs  Lab 12/19/23 2016 12/20/23 0039 12/20/23 0520 12/20/23 0754 12/20/23 1133  GLUCAP 315* 207* 92 100* 231*   Lipid Profile: No results for input(s): CHOL, HDL, LDLCALC, TRIG, CHOLHDL, LDLDIRECT in the last 72 hours. Thyroid  Function Tests: No results for input(s): TSH, T4TOTAL, FREET4, T3FREE, THYROIDAB in the last 72 hours. Anemia Panel: No results for input(s): VITAMINB12, FOLATE, FERRITIN, TIBC, IRON, RETICCTPCT in the last 72 hours. Sepsis Labs: No results for input(s): PROCALCITON, LATICACIDVEN in the last 168 hours.  Recent Results (from the past 240 hours)  Resp panel by RT-PCR (RSV, Flu A&B, Covid) Anterior Nasal Swab     Status:  None   Collection Time: 12/17/23  5:09 PM   Specimen: Anterior Nasal Swab  Result Value Ref Range Status   SARS Coronavirus 2 by RT PCR NEGATIVE NEGATIVE Final   Influenza A by PCR NEGATIVE NEGATIVE Final   Influenza B by PCR NEGATIVE NEGATIVE Final    Comment: (NOTE) The Xpert Xpress SARS-CoV-2/FLU/RSV plus assay is intended as an aid in the diagnosis of influenza from Nasopharyngeal swab specimens and should not be used as a sole basis for treatment. Nasal washings and aspirates are unacceptable for Xpert Xpress SARS-CoV-2/FLU/RSV testing.  Fact Sheet for Patients: bloggercourse.com  Fact Sheet for Healthcare Providers: seriousbroker.it  This test is not yet approved or cleared by the [place]  FDA and has been authorized for  detection and/or diagnosis of SARS-CoV-2 by FDA under an Emergency Use Authorization (EUA). This EUA will remain in effect (meaning this test can be used) for the duration of the COVID-19 declaration under Section 564(b)(1) of the Act, 21 U.S.C. section 360bbb-3(b)(1), unless the authorization is terminated or revoked.     Resp Syncytial Virus by PCR NEGATIVE NEGATIVE Final    Comment: (NOTE) Fact Sheet for Patients: bloggercourse.com  Fact Sheet for Healthcare Providers: seriousbroker.it  This test is not yet approved or cleared by the [place]  FDA and has been authorized for detection and/or diagnosis of SARS-CoV-2 by FDA under an Emergency Use Authorization (EUA). This EUA will remain in effect (meaning this test can be used) for the duration of the COVID-19 declaration under Section 564(b)(1) of the Act, 21 U.S.C. section 360bbb-3(b)(1), unless the authorization is terminated or revoked.  Performed at [facility], [address], [place], [place] [address]   Blood culture (routine x 2)     Status: None (Preliminary result)   Collection Time: 12/17/23  9:09 PM   Specimen: BLOOD  Result Value Ref Range Status   Specimen Description BLOOD LEFT ANTECUBITAL  Final   Special Requests   Final    BOTTLES DRAWN AEROBIC AND ANAEROBIC Blood Culture adequate volume   Culture   Final    NO GROWTH 3 DAYS Performed at [facility], [address], [place], [place] [address]    Report Status PENDING  Incomplete  Blood culture (routine x 2)     Status: None (Preliminary result)   Collection Time: 12/17/23  9:14 PM   Specimen: BLOOD RIGHT HAND  Result Value Ref Range Status   Specimen Description BLOOD RIGHT HAND  Final   Special Requests   Final    BOTTLES DRAWN AEROBIC AND ANAEROBIC Blood Culture results may not be optimal due to an inadequate volume of blood received in culture bottles   Culture   Final    NO  GROWTH 3 DAYS Performed at [facility], [address] [place], [place] [address]    Report Status PENDING  Incomplete    Radiology Studies: No results found.  Scheduled Meds:  apixaban   5 mg Oral BID   azithromycin   500 mg Oral Daily   buPROPion   150 mg Oral Daily   donepezil   10 mg Oral QHS   escitalopram   20 mg Oral q AM   finasteride   5 mg Oral Daily   insulin  aspart  0-6 Units Subcutaneous TID WC   insulin  aspart  2 Units Subcutaneous TID WC   insulin  glargine  8 Units Subcutaneous Q24H   pantoprazole   40 mg Oral Daily   pregabalin   75 mg Oral BID  rosuvastatin   40 mg Oral QHS   tamsulosin   0.4 mg Oral QPC supper   Continuous Infusions:  cefTRIAXone  (ROCEPHIN )  IV Stopped (12/20/23 0142)     LOS: 3 days    Time spent: 35 mins    Darcel Dawley, MD [facility]   If 7PM-7AM, please contact night-coverage

## 2023-12-20 NOTE — Care Management Important Message (Signed)
 Important Message  Patient Details  Name: Steve Brown MRN: [id number] Date of Birth: 1940/07/18   Important Message Given:  Yes - Medicare IM     Claretta Deed 12/20/2023, 3:02 PM

## 2023-12-21 DIAGNOSIS — G9340 Encephalopathy, unspecified: Secondary | ICD-10-CM | POA: Diagnosis not present

## 2023-12-21 LAB — BASIC METABOLIC PANEL WITH GFR
Anion gap: 4 — ABNORMAL LOW (ref 5–15)
BUN: 29 mg/dL — ABNORMAL HIGH (ref 8–23)
CO2: 29 mmol/L (ref 22–32)
Calcium: 8.3 mg/dL — ABNORMAL LOW (ref 8.9–10.3)
Chloride: 102 mmol/L (ref 98–111)
Creatinine, Ser: 1.67 mg/dL — ABNORMAL HIGH (ref 0.61–1.24)
GFR, Estimated: 41 mL/min — ABNORMAL LOW (ref >60)
Glucose, Bld: 290 mg/dL — ABNORMAL HIGH (ref 70–99)
Potassium: 4.7 mmol/L (ref 3.5–5.1)
Sodium: 135 mmol/L (ref 135–145)

## 2023-12-21 LAB — CBC
HCT: 33.2 % — ABNORMAL LOW (ref 39.0–52.0)
Hemoglobin: 11.4 g/dL — ABNORMAL LOW (ref 13.0–17.0)
MCH: 31.9 pg (ref 26.0–34.0)
MCHC: 34.3 g/dL (ref 30.0–36.0)
MCV: 93 fL (ref 80.0–100.0)
Platelets: 163 K/uL (ref 150–400)
RBC: 3.57 MIL/uL — ABNORMAL LOW (ref 4.22–5.81)
RDW: 13.5 % (ref 11.5–15.5)
WBC: 3.8 K/uL — ABNORMAL LOW (ref 4.0–10.5)
nRBC: 0 % (ref 0.0–0.2)

## 2023-12-21 LAB — PHOSPHORUS: Phosphorus: 3.2 mg/dL (ref 2.5–4.6)

## 2023-12-21 LAB — GLUCOSE, CAPILLARY
Glucose-Capillary: 252 mg/dL — ABNORMAL HIGH (ref 70–99)
Glucose-Capillary: 266 mg/dL — ABNORMAL HIGH (ref 70–99)

## 2023-12-21 LAB — MAGNESIUM: Magnesium: 1.7 mg/dL (ref 1.7–2.4)

## 2023-12-21 MED ORDER — CEFDINIR 300 MG PO CAPS: 300.0000 mg | ORAL_CAPSULE | Freq: Two times a day (BID) | ORAL | 0 refills | Status: AC

## 2023-12-21 NOTE — Plan of Care (Signed)
°  Problem: Clinical Measurements: Goal: Diagnostic test results will improve Outcome: Progressing   Problem: Clinical Measurements: Goal: Will remain free from infection Outcome: Progressing   Problem: Clinical Measurements: Goal: Respiratory complications will improve Outcome: Progressing   Problem: Clinical Measurements: Goal: Cardiovascular complication will be avoided Outcome: Progressing

## 2023-12-21 NOTE — Progress Notes (Signed)
 RN went over AVS with patient. Patient verbalized understanding of all teaching and had no questions. PIV removed. Patient stated his son was on the way to pick him up and that he would like to get cleaned up prior to leaving. RN provided soap and washcloths. Patient instructed to let us  know when his son got here.

## 2023-12-21 NOTE — Discharge Instructions (Signed)
 Advised to follow-up with primary care physician in 1 week. Advised to discontinue losartan  as patient has acute kidney injury in the hospital. Advised to continue Omnicef 300 mg twice daily for 3 days for community-acquired pneumonia.

## 2023-12-21 NOTE — Evaluation (Signed)
 Occupational Therapy Evaluation Patient Details Name: Steve Brown MRN: [id number] DOB: 1940-09-03 Today's Date: 12/21/2023   History of Present Illness   83 y.o. male presents to [facility] with SOB, AMS, cough, and h/a. Chest x-ray showed findings consistent with PNA. Also with encephalopathy.  Multiple prior admits. PMHx: CVA, TIA, CAD, T2DM, chronic diastolic HF, anxiety and depression, BPH, prostate cancer s/p TURB and seeding, lumbar radiculopathy, s/p neck fusion, chronic back pain, hereditary and idiopathic neuropathy, OSA, history of PE, chronic sinusitis CKD 3A     Clinical Impressions Patient admitted for the diagnosis above.  PTA he lives at home alone, but does have some family support.  He is close to his baseline for mobility and ADL completion, with mild unsteadiness.  No significant OT needs exist in the acute setting.  Defer mobility to staff and PT.  No post acute OT indicated.       If plan is discharge home, recommend the following:   Other (comment)     Functional Status Assessment   Patient has had a recent decline in their functional status and demonstrates the ability to make significant improvements in function in a reasonable and predictable amount of time.     Equipment Recommendations   None recommended by OT     Recommendations for Other Services         Precautions/Restrictions   Precautions Precautions: Fall Recall of Precautions/Restrictions: Intact Restrictions Weight Bearing Restrictions Per Provider Order: No     Mobility Bed Mobility Overal bed mobility: Needs Assistance Bed Mobility: Supine to Sit     Supine to sit: Used rails, Supervision          Transfers Overall transfer level: Needs assistance Equipment used: None Transfers: Sit to/from Stand, Bed to chair/wheelchair/BSC Sit to Stand: Supervision     Step pivot transfers: Supervision, Contact guard assist            Balance Overall balance assessment:  Needs assistance Sitting-balance support: Feet supported Sitting balance-Leahy Scale: Good     Standing balance support: No upper extremity supported Standing balance-Leahy Scale: Fair                             ADL either performed or assessed with clinical judgement   ADL Overall ADL's : At baseline                                       General ADL Comments: Mild unsteadiness, uses a cane at baseline     Vision Baseline Vision/History: 1 Wears glasses Patient Visual Report: No change from baseline       Perception Perception: Not tested       Praxis Praxis: Not tested       Pertinent Vitals/Pain Pain Assessment Pain Assessment: No/denies pain     Extremity/Trunk Assessment Upper Extremity Assessment Upper Extremity Assessment: Overall WFL for tasks assessed   Lower Extremity Assessment Lower Extremity Assessment: Defer to PT evaluation   Cervical / Trunk Assessment Cervical / Trunk Assessment: Normal   Communication Communication Communication: No apparent difficulties   Cognition Arousal: Alert Behavior During Therapy: WFL for tasks assessed/performed Cognition: No apparent impairments                               Following commands: Intact  Cueing  General Comments   Cueing Techniques: Verbal cues      Exercises     Shoulder Instructions      Home Living Family/patient expects to be discharged to:: Private residence Living Arrangements: Alone Available Help at Discharge: Family;Available PRN/intermittently;Friend(s) Type of Home: Apartment Home Access: Stairs to enter Entrance Stairs-Number of Steps: 16 Entrance Stairs-Rails: Right Home Layout: One level     Bathroom Shower/Tub: Chief Strategy Officer: Standard Bathroom Accessibility: Yes How Accessible: Accessible via walker Home Equipment: Rolling Walker (2 wheels);Cane - single point;Shower seat          Prior  Functioning/Environment Prior Level of Function : Independent/Modified Independent;Driving             Mobility Comments: Ind with occasional use of SP cane. RW for longer distances ADLs Comments: Independent. States he manages own meds, pays bills, drives to dr. margurette.    OT Problem List: Impaired balance (sitting and/or standing)   OT Treatment/Interventions:        OT Goals(Current goals can be found in the care plan section)   Acute Rehab OT Goals Patient Stated Goal: Return home OT Goal Formulation: With patient Time For Goal Achievement: 12/28/23 Potential to Achieve Goals: Fair   OT Frequency:       Co-evaluation              AM-PAC OT 6 Clicks Daily Activity     Outcome Measure Help from another person eating meals?: None Help from another person taking care of personal grooming?: None Help from another person toileting, which includes using toliet, bedpan, or urinal?: A Little Help from another person bathing (including washing, rinsing, drying)?: A Little Help from another person to put on and taking off regular upper body clothing?: None Help from another person to put on and taking off regular lower body clothing?: A Little 6 Click Score: 21   End of Session Equipment Utilized During Treatment: Gait belt Nurse Communication: Mobility status  Activity Tolerance: Patient tolerated treatment well Patient left: in chair;with call bell/phone within reach;with chair alarm set  OT Visit Diagnosis: Unsteadiness on feet (R26.81)                Time: 9084-9065 OT Time Calculation (min): 19 min Charges:  OT General Charges $OT Visit: 1 Visit OT Evaluation $OT Eval Moderate Complexity: 1 Mod  12/21/2023  RP, OTR/L  Acute Rehabilitation Services  Office:  [phone]   Charlie JONETTA Halsted 12/21/2023, 9:40 AM

## 2023-12-21 NOTE — Discharge Summary (Signed)
 Physician Discharge Summary  Jerred Zaremba FMW:[id number] DOB: 13-Apr-1940 DOA: 12/17/2023  PCP: [facility]  Admit date: 12/17/2023  Discharge date: 12/21/2023  Admitted From: Home  Disposition:  Home  Recommendations for Outpatient Follow-up:  Follow up with PCP in 1-2 weeks. Please obtain BMP/CBC in one week. Advised to discontinue losartan  as patient has acute kidney injury in the hospital. Advised to continue Omnicef 300 mg twice daily for 3 days for community-acquired pneumonia. Advised to follow-up with cardiology.  Home Health: None Equipment/Devices: None  Discharge Condition: Stable CODE STATUS: Full code Diet recommendation: Carb modified diet  Brief Summary / Hospital Course: This 83 yrs old Male with PMH significant for dementia, A-fib on Eliquis , history of coronary artery disease status post stents in 2019 presented in the ED with shortness of breath , cough and headache. Patient was found to be confused and only oriented to self which was reportedly not at his baseline. Patient was hemodynamically stable. Significant labs in the ED troponin 63, BNP 272, UA negative for infection. CT head no acute findings. Chest x-ray shows findings consistent with pneumonia. Patient was admitted for further evaluation and started on antibiotics for possible pneumonia. Cardiology was consulted for elevated troponins.  Patient denied any chest pain.  Patient has normal left heart cath in 2019 and normal stress test in 2021.Cardiology recommended no plans for any ischemic evaluation.  elevated troponin likely due to the demand ischemia in the setting of pneumonia.  Patient feels much better and wants to be discharged home.  Patient is being discharged home on Omnicef 300 mL twice daily for 3 more days.  Nephrology was consulted for AKI.  Which also improved with hydration.  Losartan  discontinued at discharge.  Discharge Diagnoses:  Principal Problem:   Encephalopathy Active  Problems:   Coronary artery disease involving native coronary artery of native heart   Elevated troponin   Pneumonia due to infectious organism  Acute infectious encephalopathy: Likely community-acquired pneumonia: Patient presented with confusion, headache, cough, shortness of breath. Chest x-ray shows findings consistent with pneumonia. CT head negative for acute findings.  Blood cultures no growth for 3 days. Continue empiric antibiotics ceftriaxone  and Zithromax . Continue antitussives. Patient is hemodynamically stable. Mental status is at baseline. Patient discharged on Omnicef 300 mg twice daily for 3 days.   Elevated troponins: Likely in the setting of demand ischemia due to pneumonia. Troponins 63> 81> 127> 136> 143> 155. Patient denies any chest pain.  Patient has normal LHC in 2019, Normal stress test in 2021. Cardiology consult appreciated recommended limited echo to assess for wall motion abnormalities. No plans for any ischemic evaluation. Echocardiogram shows LVEF 60 to 65%, no RWMA.   Atrial fibrillation: Heart rate is well-controlled.  Continue Eliquis .   Depression: Continue bupropion , Lexapro  , Remeron .   Cognitive impairment: Continue donepezil ..   BPH: Continue finasteride , Flomax    Type 2 diabetes: Continue Lantus , RISS.   GERD; Continue Protonix .   Hyperlipidemia: Continue Crestor .   HTN: Hold losartan  given AKI.   Acute kidney injury on CKD stage IIIa: Baseline serum creatinine remains between 1.3-1.9. Creatinine jumped to 2.99 unknown reason. Nephrology is consulted.  Seems to be hemodynamically mediated. Renal functions improved.  Hold losartan  and hold on discharge.  Discharge Instructions  Discharge Instructions     Call MD for:  difficulty breathing, headache or visual disturbances   Complete by: As directed    Call MD for:  persistant dizziness or light-headedness   Complete by: As directed  Call MD for:  persistant nausea  and vomiting   Complete by: As directed    Diet general   Complete by: As directed    Discharge instructions   Complete by: As directed    Advised to follow-up with primary care physician in 1 week. Advised to discontinue losartan  as patient has acute kidney injury in the hospital. Advised to continue Omnicef 300 mg twice daily for 3 days for community-acquired pneumonia.   Increase activity slowly   Complete by: As directed       Allergies as of 12/21/2023       Reactions   Other Other (See Comments)   Per patient- cardiac cath dye-  woke up during procedure hysterical.   Phenergan  [promethazine ] Other (See Comments)   Syncope  Mood changes   Topamax [topiramate] Other (See Comments)   Hallucinations    Ultram  [tramadol ] Other (See Comments)   Greatly increased blood sugar        Medication List     STOP taking these medications    cephALEXin  250 MG capsule Commonly known as: KEFLEX    losartan  25 MG tablet Commonly known as: COZAAR        TAKE these medications    apixaban  5 MG Tabs tablet Commonly known as: ELIQUIS  Take 5 mg by mouth 2 (two) times daily.   buPROPion  150 MG 24 hr tablet Commonly known as: WELLBUTRIN  XL Take 150 mg by mouth daily.   cefdinir 300 MG capsule Commonly known as: OMNICEF Take 1 capsule (300 mg total) by mouth 2 (two) times daily for 3 days.   diclofenac  Sodium 1 % Gel Commonly known as: VOLTAREN  Apply 4 g topically 4 (four) times daily. What changed:  when to take this reasons to take this   donepezil  10 MG tablet Commonly known as: ARICEPT  Take 10 mg by mouth daily.   escitalopram  20 MG tablet Commonly known as: LEXAPRO  Take 20 mg by mouth in the morning.   finasteride  5 MG tablet Commonly known as: PROSCAR  Take 5 mg by mouth daily.   fluticasone  50 MCG/ACT nasal spray Commonly known as: FLONASE  Place 2 sprays into both nostrils daily as needed for allergies or rhinitis.   furosemide  20 MG tablet Commonly  known as: LASIX  Take 1-2 tablets (20-40 mg total) by mouth daily as needed for fluid. What changed:  how much to take when to take this   HYDROcodone -acetaminophen  5-325 MG tablet Commonly known as: NORCO/VICODIN Take 1 tablet by mouth every 6 (six) hours as needed for severe pain (pain score 7-10).   insulin  aspart 100 UNIT/ML FlexPen Commonly known as: NOVOLOG  Inject 5 Units into the skin 3 (three) times daily with meals. What changed:  how much to take when to take this   ketoconazole  2 % cream Commonly known as: NIZORAL  Apply 1 Application topically 2 (two) times daily as needed (skin irritation).   lidocaine  5 % ointment Commonly known as: XYLOCAINE  Apply 1 Application topically every 4 (four) hours as needed (pain). What changed: Another medication with the same name was changed. Make sure you understand how and when to take each.   lidocaine  5 % Commonly known as: Lidoderm  Place 1 patch onto the skin daily. Remove & Discard patch within 12 hours or as directed by MD What changed:  when to take this reasons to take this additional instructions   methocarbamol  500 MG tablet Commonly known as: ROBAXIN  Take 1-2 tablets (500-1,000 mg total) by mouth every 8 (eight) hours as  needed for muscle spasms.   mirtazapine  15 MG tablet Commonly known as: REMERON  Take 7.5 mg by mouth at bedtime as needed (sleep, anxiety, nightmares).   Multivitamin Men 50+ Tabs Take 1 tablet by mouth daily.   pantoprazole  40 MG tablet Commonly known as: PROTONIX  Take 40 mg by mouth daily.   pregabalin  300 MG capsule Commonly known as: LYRICA  Take 300 mg by mouth 2 (two) times daily.   rosuvastatin  40 MG tablet Commonly known as: CRESTOR  Take 40 mg by mouth daily.   tamsulosin  0.4 MG Caps capsule Commonly known as: FLOMAX  Take 1 capsule (0.4 mg total) by mouth daily after supper.   Tresiba  FlexTouch 100 UNIT/ML FlexTouch Pen Generic drug: insulin  degludec Inject 34 Units into the  skin daily. What changed: how much to take   valACYclovir  500 MG tablet Commonly known as: VALTREX  Take 500 mg by mouth daily.   VITAMIN D -3 PO Take 1 tablet by mouth daily.        Follow-up Information     [facility] Follow up in 1 week(s).   Contact information: [address] [facility] [place] [address] [phone]         Kate Lonni CROME, MD Follow up in 1 week(s).   Specialties: Cardiology, Radiology Contact information: [address] [place] [address] [phone]                Allergies[1]  Consultations: Nephrology   Procedures/Studies: ECHOCARDIOGRAM LIMITED Result Date: 12/18/2023    ECHOCARDIOGRAM LIMITED REPORT   Patient Name:   Joevanni Roddey Date of Exam: 12/18/2023 Medical Rec #:  [id number]        Height:       65.0 in Accession #:    [id number]       Weight:       170.4 lb Date of Birth:  1940-08-20         BSA:          1.848 m Patient Age:    83 years         BP:           138/90 mmHg Patient Gender: M                HR:           65 bpm. Exam Location:  Inpatient Procedure: Limited Echo, Cardiac Doppler and Color Doppler (Both Spectral and            Color Flow Doppler were utilized during procedure). Indications:    Elevated Troponin  History:        Patient has prior history of Echocardiogram examinations, most                 recent 11/22/2023. CAD, TIA and Stroke; Risk                 Factors:Hypertension, Diabetes, Sleep Apnea and Dyslipidemia.                 CKD.  Sonographer:    Philomena Daring Referring Phys: WADDELL [employer]OR PARCELLS IMPRESSIONS  1. Left ventricular ejection fraction, by estimation, is 60 to 65%. The left ventricle has normal function. The left ventricle has no regional wall motion abnormalities. There is mild concentric left ventricular hypertrophy.  2. Right ventricular systolic function is normal. The right ventricular size is normal.  3. The mitral valve is normal in structure. No  evidence of mitral valve regurgitation. No evidence of mitral stenosis.  4. Tricuspid valve regurgitation is mild to moderate.  5. The aortic valve is normal in structure. Aortic valve regurgitation is trivial. No aortic stenosis is present.  6. The inferior vena cava is normal in size with greater than 50% respiratory variability, suggesting right atrial pressure of 3 mmHg. FINDINGS  Left Ventricle: Left ventricular ejection fraction, by estimation, is 60 to 65%. The left ventricle has normal function. The left ventricle has no regional wall motion abnormalities. The left ventricular internal cavity size was normal in size. There is  mild concentric left ventricular hypertrophy. Right Ventricle: The right ventricular size is normal. No increase in right ventricular wall thickness. Right ventricular systolic function is normal. Left Atrium: Left atrial size was normal in size. Right Atrium: Right atrial size was normal in size. Pericardium: There is no evidence of pericardial effusion. Mitral Valve: The mitral valve is normal in structure. No evidence of mitral valve stenosis. Tricuspid Valve: The tricuspid valve is normal in structure. Tricuspid valve regurgitation is mild to moderate. No evidence of tricuspid stenosis. Aortic Valve: The aortic valve is normal in structure. Aortic valve regurgitation is trivial. No aortic stenosis is present. Pulmonic Valve: The pulmonic valve was normal in structure. Pulmonic valve regurgitation is not visualized. No evidence of pulmonic stenosis. Aorta: The aortic root is normal in size and structure. Venous: The inferior vena cava is normal in size with greater than 50% respiratory variability, suggesting right atrial pressure of 3 mmHg. IAS/Shunts: No atrial level shunt detected by color flow Doppler. Additional Comments: Spectral Doppler performed. Color Doppler performed.  LEFT VENTRICLE PLAX 2D LVIDd:         3.60 cm   Diastology LVIDs:         2.40 cm   LV e' medial:     6.42 cm/s LV PW:         1.20 cm   LV E/e' medial:  8.2 LV IVS:        1.20 cm   LV e' lateral:   6.31 cm/s LVOT diam:     2.00 cm   LV E/e' lateral: 8.3 LV SV:         65 LV SV Index:   35 LVOT Area:     3.14 cm  RIGHT VENTRICLE             IVC RV S prime:     13.20 cm/s  IVC diam: 1.20 cm TAPSE (M-mode): 1.7 cm LEFT ATRIUM         Index LA diam:    2.70 cm 1.46 cm/m  AORTIC VALVE LVOT Vmax:   104.00 cm/s LVOT Vmean:  70.700 cm/s LVOT VTI:    0.207 m  AORTA Ao Root diam: 2.70 cm Ao Asc diam:  3.40 cm MITRAL VALVE               TRICUSPID VALVE MV Area (PHT): 3.42 cm    TR Peak grad:   27.0 mmHg MV Decel Time: 222 msec    TR Vmax:        260.00 cm/s MV E velocity: 52.40 cm/s MV A velocity: 66.60 cm/s  SHUNTS MV E/A ratio:  0.79        Systemic VTI:  0.21 m                            Systemic Diam: 2.00 cm Kardie Tobb DO Electronically signed by Dub Huntsman DO Signature Date/Time: 12/18/2023/3:41:28  PM    Final    CT Head Wo Contrast Result Date: 12/17/2023 EXAM: CT HEAD WITHOUT CONTRAST 12/17/2023 05:47:29 PM TECHNIQUE: CT of the head was performed without the administration of intravenous contrast. Automated exposure control, iterative reconstruction, and/or weight based adjustment of the mA/kV was utilized to reduce the radiation dose to as low as reasonably achievable. COMPARISON: 11/21/2023 CLINICAL HISTORY: Headache, increasing frequency or severity. FINDINGS: BRAIN AND VENTRICLES: No acute hemorrhage. No evidence of acute infarct. Patchy and confluent decreased attenuation throughout deep and periventricular white matter of cerebral hemispheres bilaterally, compatible with chronic microvascular ischemic disease. Chronic left cerebellar infarction. Cerebral ventricle sizes concordant with degree of cerebral volume loss. No extra-axial collection. No mass effect or midline shift. ORBITS: Bilateral lens replacement. No acute abnormality. SINUSES: Unchanged chronic right sphenoid sinusitis. Complete  opacification of the right sphenoid sinus and partial opacification of the left sphenoid sinus. Significant mucosal thickening within the left posterior ethmoid air cells. Additional scattered mucosal thickening in the bilateral ethmoid and maxillary sinuses. SOFT TISSUES AND SKULL: Similar-appearing left frontal scalp lipoma. No acute soft tissue abnormality. No skull fracture. VASCULATURE: Atherosclerosis of the carotid siphons. IMPRESSION: 1. No acute intracranial abnormality. 2. 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 3. Unchanged chronic right sphenoid sinusitis with additional scattered mucosal thickening in the bilateral ethmoid and maxillary sinuses. Electronically signed by: Donnice Mania MD 12/17/2023 06:05 PM EST RP Workstation: [id number]   DG Chest Port 1 View Result Date: 12/17/2023 CLINICAL DATA:  Short of breath EXAM: PORTABLE CHEST 1 VIEW COMPARISON:  11/21/2023 FINDINGS: Single frontal view of the chest demonstrates an unremarkable cardiac silhouette. There is patchy opacity at the left lateral lung base obscuring the costophrenic angle, consistent with consolidation and/or small effusion. Right chest is clear. No pneumothorax. No acute bony abnormalities. IMPRESSION: 1. Patchy left lower lobe consolidation and/or small left pleural effusion, obscuring the left costophrenic angle. Electronically Signed   By: Ozell Daring M.D.   On: 12/17/2023 17:42   ECHOCARDIOGRAM COMPLETE Result Date: 11/22/2023    ECHOCARDIOGRAM REPORT   Patient Name:   RONIE BARNHART Date of Exam: 11/22/2023 Medical Rec #:  [id number]        Height:       65.0 in Accession #:    [id number]       Weight:       185.0 lb Date of Birth:  [birth year]         BSA:          1.914 m Patient Age:    83 years         BP:           120/62 mmHg Patient Gender: M                HR:           63 bpm. Exam Location:  Inpatient Procedure: 2D Echo, Color Doppler and Cardiac Doppler (Both Spectral  and Color            Flow Doppler were utilized during procedure). Indications:    Elevated Troponin  History:        Patient has prior history of Echocardiogram examinations, most                 recent 08/07/2022. CAD, TIA and Stroke, Signs/Symptoms:Syncope,                 Hypotension, Shortness of Breath, Chest Pain and  Dizziness/Lightheadedness; Risk Factors:Diabetes, Dyslipidemia,                 Hypertension and Sleep Apnea.  Sonographer:    Juliene Rucks Referring Phys: [id number] [facility] POK[facility]REL  Sonographer Comments: Patient is obese. IMPRESSIONS  1. Left ventricular ejection fraction, by estimation, is 50 to 55%. The left ventricle has low normal function. The left ventricle has no regional wall motion abnormalities. There is moderate concentric left ventricular hypertrophy. Left ventricular diastolic parameters are consistent with Grade I diastolic dysfunction (impaired relaxation).  2. Right ventricular systolic function is normal. The right ventricular size is normal.  3. The mitral valve is normal in structure. No evidence of mitral valve regurgitation. No evidence of mitral stenosis.  4. The aortic valve is normal in structure. Aortic valve regurgitation is trivial. Aortic valve sclerosis is present, with no evidence of aortic valve stenosis.  5. The inferior vena cava is normal in size with greater than 50% respiratory variability, suggesting right atrial pressure of 3 mmHg. FINDINGS  Left Ventricle: Left ventricular ejection fraction, by estimation, is 50 to 55%. The left ventricle has low normal function. The left ventricle has no regional wall motion abnormalities. The left ventricular internal cavity size was normal in size. There is moderate concentric left ventricular hypertrophy. Left ventricular diastolic parameters are consistent with Grade I diastolic dysfunction (impaired relaxation). Right Ventricle: The right ventricular size is normal. No increase in right ventricular wall  thickness. Right ventricular systolic function is normal. Left Atrium: Left atrial size was normal in size. Right Atrium: Right atrial size was normal in size. Pericardium: There is no evidence of pericardial effusion. Mitral Valve: The mitral valve is normal in structure. No evidence of mitral valve regurgitation. No evidence of mitral valve stenosis. Tricuspid Valve: The tricuspid valve is normal in structure. Tricuspid valve regurgitation is mild . No evidence of tricuspid stenosis. Aortic Valve: The aortic valve is normal in structure. Aortic valve regurgitation is trivial. Aortic valve sclerosis is present, with no evidence of aortic valve stenosis. Pulmonic Valve: The pulmonic valve was not well visualized. Pulmonic valve regurgitation is trivial. No evidence of pulmonic stenosis. Aorta: The aortic root is normal in size and structure. Venous: The inferior vena cava is normal in size with greater than 50% respiratory variability, suggesting right atrial pressure of 3 mmHg. IAS/Shunts: No atrial level shunt detected by color flow Doppler.  LEFT VENTRICLE PLAX 2D LVIDd:         3.50 cm   Diastology LVIDs:         2.60 cm   LV e' medial:    3.37 cm/s LV PW:         1.30 cm   LV E/e' medial:  16.1 LV IVS:        1.40 cm   LV e' lateral:   3.70 cm/s LVOT diam:     2.00 cm   LV E/e' lateral: 14.6 LV SV:         66 LV SV Index:   35 LVOT Area:     3.14 cm  RIGHT VENTRICLE          IVC RV Basal diam:  2.80 cm  IVC diam: 1.30 cm RV Mid diam:    2.30 cm LEFT ATRIUM           Index        RIGHT ATRIUM           Index LA diam:  3.20 cm 1.67 cm/m   RA Area:     14.10 cm LA Vol (A2C): 54.2 ml 28.32 ml/m  RA Volume:   31.00 ml  16.20 ml/m LA Vol (A4C): 53.4 ml 27.91 ml/m  AORTIC VALVE LVOT Vmax:   101.00 cm/s LVOT Vmean:  64.900 cm/s LVOT VTI:    0.211 m  AORTA Ao Root diam: 3.50 cm MITRAL VALVE               TRICUSPID VALVE MV Area (PHT): 3.42 cm    TR Peak grad:   30.9 mmHg MV Decel Time: 222 msec    TR Vmax:         278.00 cm/s MV E velocity: 54.20 cm/s MV A velocity: 87.40 cm/s  SHUNTS MV E/A ratio:  0.62        Systemic VTI:  0.21 m                            Systemic Diam: 2.00 cm Kardie Tobb DO Electronically signed by Dub Huntsman DO Signature Date/Time: 11/22/2023/12:53:01 PM    Final    VAS US  LOWER EXTREMITY VENOUS (DVT) Result Date: 11/22/2023  Lower Venous DVT Study Patient Name:  JAYR LUPERCIO  Date of Exam:   11/22/2023 Medical Rec #: [id number]         Accession #:    [id number] Date of Birth: 10-10-1940          Patient Gender: M Patient Age:   72 years Exam Location:  [facility] Procedure:      VAS US  LOWER EXTREMITY VENOUS (DVT) Referring Phys: EKTA PATEL --------------------------------------------------------------------------------  Indications: Edema.  Risk Factors: None identified. Comparison Study: No prior studies. Performing Technologist: Cordella Collet RVT  Examination Guidelines: A complete evaluation includes B-mode imaging, spectral Doppler, color Doppler, and power Doppler as needed of all accessible portions of each vessel. Bilateral testing is considered an integral part of a complete examination. Limited examinations for reoccurring indications may be performed as noted. The reflux portion of the exam is performed with the patient in reverse Trendelenburg.  +---------+---------------+---------+-----------+----------+--------------+ RIGHT    CompressibilityPhasicitySpontaneityPropertiesThrombus Aging +---------+---------------+---------+-----------+----------+--------------+ CFV      Full           Yes      Yes                                 +---------+---------------+---------+-----------+----------+--------------+ SFJ      Full                                                        +---------+---------------+---------+-----------+----------+--------------+ FV Prox  Full                                                         +---------+---------------+---------+-----------+----------+--------------+ FV Mid   Full                                                        +---------+---------------+---------+-----------+----------+--------------+  FV DistalFull                                                        +---------+---------------+---------+-----------+----------+--------------+ PFV      Full                                                        +---------+---------------+---------+-----------+----------+--------------+ POP      Full           Yes      Yes                                 +---------+---------------+---------+-----------+----------+--------------+ PTV      Full                                                        +---------+---------------+---------+-----------+----------+--------------+ PERO     Full                                                        +---------+---------------+---------+-----------+----------+--------------+   +---------+---------------+---------+-----------+----------+--------------+ LEFT     CompressibilityPhasicitySpontaneityPropertiesThrombus Aging +---------+---------------+---------+-----------+----------+--------------+ CFV      Full           Yes      Yes                                 +---------+---------------+---------+-----------+----------+--------------+ SFJ      Full                                                        +---------+---------------+---------+-----------+----------+--------------+ FV Prox  Full                                                        +---------+---------------+---------+-----------+----------+--------------+ FV Mid   Full                                                        +---------+---------------+---------+-----------+----------+--------------+ FV DistalFull                                                         +---------+---------------+---------+-----------+----------+--------------+  PFV      None           No       No                   Acute          +---------+---------------+---------+-----------+----------+--------------+ POP      Full           Yes      Yes                                 +---------+---------------+---------+-----------+----------+--------------+ PTV      Full                                                        +---------+---------------+---------+-----------+----------+--------------+ PERO     Full                                                        +---------+---------------+---------+-----------+----------+--------------+     Summary: RIGHT: - There is no evidence of deep vein thrombosis in the lower extremity.  - No cystic structure found in the popliteal fossa.  LEFT: - Findings consistent with acute deep vein thrombosis involving the left proximal profunda vein.  - No cystic structure found in the popliteal fossa.  *See table(s) above for measurements and observations. Electronically signed by Debby Robertson on 11/22/2023 at 12:08:46 PM.    Final    MR SHOULDER LEFT WO CONTRAST Result Date: 11/22/2023 CLINICAL DATA:  Left shoulder pain EXAM: MRI OF THE LEFT SHOULDER WITHOUT CONTRAST TECHNIQUE: Multiplanar, multisequence MR imaging of the shoulder was performed. No intravenous contrast was administered. COMPARISON:  None Available. FINDINGS: Rotator cuff:  Mild supraspinatus and subscapularis tendinopathy. Muscles: Diffuse moderate regional muscular atrophy although with relative sparing of the long head of the triceps and the trapezius muscle. Biceps long head: Moderate tendinopathy of the intra-articular segment. Acromioclavicular Joint: Mild degenerative AC joint spurring. Type II acromion. Physiologic fluid in the subacromial subdeltoid bursa. Glenohumeral Joint: Moderate degenerative glenohumeral arthropathy with chondral thinning and associated  spurring. Small multilobulated concentration of fluid distally along the biceps recess of the joint without overt joint effusion. Labrum:  Unremarkable Bones: No significant extra-articular osseous abnormalities identified. Other: No supplemental non-categorized findings. IMPRESSION: 1. Mild supraspinatus and subscapularis tendinopathy. 2. Moderate tendinopathy of the intra-articular segment of the long head of the biceps. 3. Moderate degenerative glenohumeral arthropathy. 4. Mild degenerative AC joint spurring. 5. Diffuse moderate regional muscular atrophy although with relative sparing of the long head of the triceps and the trapezius muscle. Electronically Signed   By: Ryan Salvage M.D.   On: 11/22/2023 08:29   DG Chest 1 View Result Date: 11/21/2023 EXAM: 1 VIEW(S) XRAY OF THE CHEST 11/21/2023 04:13:00 PM COMPARISON: 02/05/2023 CLINICAL HISTORY: Chest pain FINDINGS: LUNGS AND PLEURA: Low lung volumes with bronchovascular crowding. No focal airspace consolidation. No pleural effusion. No pneumothorax. HEART AND MEDIASTINUM: Mild cardiomegaly. Aortic atherosclerosis. BONES AND SOFT TISSUES: Lower cervical fusion hardware. Cholecystectomy clips. Multilevel thoracic osteophytosis. No acute osseous abnormality. IMPRESSION: 1. Lower lung volumes. No acute  cardiopulmonary abnormality. Electronically signed by: Rogelia Myers MD 11/21/2023 05:08 PM EST RP Workstation: [id number]   DG Shoulder Left Result Date: 11/21/2023 EXAM: 1 VIEW XRAY OF THE LEFT SHOULDER 11/21/2023 04:13:00 PM COMPARISON: None available. CLINICAL HISTORY: Chest pain FINDINGS: BONES AND JOINTS: Osteopenia. Moderate glenohumeral joint osteoarthritis. No acute fracture or dislocation. Mild osteoarthritis of the acromioclavicular joint. SOFT TISSUES: Visualized lung is unremarkable. Aortic atherosclerosis. IMPRESSION: 1. No acute fracture or dislocation. 2. Glenohumeral and AC joint osteoarthritis. Electronically signed by: Rogelia Myers  MD 11/21/2023 05:06 PM EST RP Workstation: [id number]     Subjective: Patient was seen and examined at bedside.  Overnight events noted. Patient reports feeling much better and he wants  to be discharged home.  Discharge Exam: Vitals:   12/21/23 0455 12/21/23 0806  BP: (!) 151/80 117/73  Pulse: 65 81  Resp: 18 18  Temp: 98.1 F (36.7 C)   SpO2: 100%    Vitals:   12/20/23 2104 12/21/23 0045 12/21/23 0455 12/21/23 0806  BP:  (!) 128/55 (!) 151/80 117/73  Pulse:  64 65 81  Resp:  20 18 18   Temp:  98.3 F (36.8 C) 98.1 F (36.7 C)   TempSrc:  Oral Oral   SpO2:  99% 100%   Weight: 78.2 kg     Height: 5' 5 (1.651 m)       General: Pt is alert, awake, not in acute distress Cardiovascular: RRR, S1/S2 +, no rubs, no gallops Respiratory: CTA bilaterally, no wheezing, no rhonchi Abdominal: Soft, NT, ND, bowel sounds + Extremities: no edema, no cyanosis    The results of significant diagnostics from this hospitalization (including imaging, microbiology, ancillary and laboratory) are listed below for reference.     Microbiology: Recent Results (from the past 240 hours)  Resp panel by RT-PCR (RSV, Flu A&B, Covid) Anterior Nasal Swab     Status: None   Collection Time: 12/17/23  5:09 PM   Specimen: Anterior Nasal Swab  Result Value Ref Range Status   SARS Coronavirus 2 by RT PCR NEGATIVE NEGATIVE Final   Influenza A by PCR NEGATIVE NEGATIVE Final   Influenza B by PCR NEGATIVE NEGATIVE Final    Comment: (NOTE) The Xpert Xpress SARS-CoV-2/FLU/RSV plus assay is intended as an aid in the diagnosis of influenza from Nasopharyngeal swab specimens and should not be used as a sole basis for treatment. Nasal washings and aspirates are unacceptable for Xpert Xpress SARS-CoV-2/FLU/RSV testing.  Fact Sheet for Patients: bloggercourse.com  Fact Sheet for Healthcare Providers: seriousbroker.it  This test is not yet approved or  cleared by the [place]  FDA and has been authorized for detection and/or diagnosis of SARS-CoV-2 by FDA under an Emergency Use Authorization (EUA). This EUA will remain in effect (meaning this test can be used) for the duration of the COVID-19 declaration under Section 564(b)(1) of the Act, 21 U.S.C. section 360bbb-3(b)(1), unless the authorization is terminated or revoked.     Resp Syncytial Virus by PCR NEGATIVE NEGATIVE Final    Comment: (NOTE) Fact Sheet for Patients: bloggercourse.com  Fact Sheet for Healthcare Providers: seriousbroker.it  This test is not yet approved or cleared by the [place]  FDA and has been authorized for detection and/or diagnosis of SARS-CoV-2 by FDA under an Emergency Use Authorization (EUA). This EUA will remain in effect (meaning this test can be used) for the duration of the COVID-19 declaration under Section 564(b)(1) of the Act, 21 U.S.C. section 360bbb-3(b)(1), unless the authorization is terminated or revoked.  Performed  at [facility], [address] [place], [place] [address]   Blood culture (routine x 2)     Status: None (Preliminary result)   Collection Time: 12/17/23  9:09 PM   Specimen: BLOOD  Result Value Ref Range Status   Specimen Description BLOOD LEFT ANTECUBITAL  Final   Special Requests   Final    BOTTLES DRAWN AEROBIC AND ANAEROBIC Blood Culture adequate volume   Culture   Final    NO GROWTH 4 DAYS Performed at [facility], [address], [place], [place] [address]    Report Status PENDING  Incomplete  Blood culture (routine x 2)     Status: None (Preliminary result)   Collection Time: 12/17/23  9:14 PM   Specimen: BLOOD RIGHT HAND  Result Value Ref Range Status   Specimen Description BLOOD RIGHT HAND  Final   Special Requests   Final    BOTTLES DRAWN AEROBIC AND ANAEROBIC Blood Culture results may not be optimal due to an inadequate volume of  blood received in culture bottles   Culture   Final    NO GROWTH 4 DAYS Performed at [facility], [address], [place], [place] [address]    Report Status PENDING  Incomplete     Labs: BNP (last 3 results) Recent Labs    11/21/23 2010 12/17/23 1758  BNP 541.3* 172.6*   Basic Metabolic Panel: Recent Labs  Lab 12/17/23 1949 12/18/23 0125 12/19/23 0339 12/20/23 0846 12/21/23 0511  NA 137 136 139 138 135  K 4.1 4.0 3.7 4.4 4.7  CL 102 102 105 105 102  CO2 30 25 26 23 29   GLUCOSE 290* 277* 107* 150* 290*  BUN 18 19 34* 34* 29*  CREATININE 1.54* 1.59* 2.99* 2.13* 1.67*  CALCIUM  8.8* 8.5* 8.5* 8.5* 8.3*  MG  --   --  1.9  --  1.7  PHOS  --   --  4.1  --  3.2   Liver Function Tests: No results for input(s): AST, ALT, ALKPHOS, BILITOT, PROT, ALBUMIN  in the last 168 hours. No results for input(s): LIPASE, AMYLASE in the last 168 hours. No results for input(s): AMMONIA in the last 168 hours. CBC: Recent Labs  Lab 12/17/23 1758 12/18/23 0125 12/19/23 0339 12/21/23 0511  WBC 4.1 4.0 5.0 3.8*  HGB 12.4* 12.4* 11.8* 11.4*  HCT 35.6* 35.9* 34.5* 33.2*  MCV 90.4 92.1 93.8 93.0  PLT 219 168 165 163   Cardiac Enzymes: No results for input(s): CKTOTAL, CKMB, CKMBINDEX, TROPONINI in the last 168 hours. BNP: Invalid input(s): POCBNP CBG: Recent Labs  Lab 12/20/23 1133 12/20/23 1623 12/20/23 1932 12/21/23 0046 12/21/23 0805  GLUCAP 231* 296* 250* 266* 252*   D-Dimer No results for input(s): DDIMER in the last 72 hours. Hgb A1c No results for input(s): HGBA1C in the last 72 hours. Lipid Profile No results for input(s): CHOL, HDL, LDLCALC, TRIG, CHOLHDL, LDLDIRECT in the last 72 hours. Thyroid  function studies No results for input(s): TSH, T4TOTAL, T3FREE, THYROIDAB in the last 72 hours.  Invalid input(s): FREET3 Anemia work up No results for input(s): VITAMINB12, FOLATE, FERRITIN, TIBC,  IRON, RETICCTPCT in the last 72 hours. Urinalysis    Component Value Date/Time   COLORURINE YELLOW 12/17/2023 1856   APPEARANCEUR CLEAR 12/17/2023 1856   LABSPEC 1.013 12/17/2023 1856   LABSPEC 1.015 12/08/2016 1408   PHURINE 7.0 12/17/2023 1856   GLUCOSEU >=500 (A) 12/17/2023 1856   GLUCOSEU Negative 12/08/2016 1408  HGBUR NEGATIVE 12/17/2023 1856   BILIRUBINUR NEGATIVE 12/17/2023 1856   BILIRUBINUR negative 07/05/2020 1611   BILIRUBINUR Negative 12/08/2016 1408   KETONESUR 20 (A) 12/17/2023 1856   PROTEINUR 30 (A) 12/17/2023 1856   UROBILINOGEN 0.2 07/05/2020 1611   UROBILINOGEN 0.2 12/08/2016 1408   NITRITE NEGATIVE 12/17/2023 1856   LEUKOCYTESUR NEGATIVE 12/17/2023 1856   LEUKOCYTESUR Moderate 12/08/2016 1408   Sepsis Labs Recent Labs  Lab 12/17/23 1758 12/18/23 0125 12/19/23 0339 12/21/23 0511  WBC 4.1 4.0 5.0 3.8*   Microbiology Recent Results (from the past 240 hours)  Resp panel by RT-PCR (RSV, Flu A&B, Covid) Anterior Nasal Swab     Status: None   Collection Time: 12/17/23  5:09 PM   Specimen: Anterior Nasal Swab  Result Value Ref Range Status   SARS Coronavirus 2 by RT PCR NEGATIVE NEGATIVE Final   Influenza A by PCR NEGATIVE NEGATIVE Final   Influenza B by PCR NEGATIVE NEGATIVE Final    Comment: (NOTE) The Xpert Xpress SARS-CoV-2/FLU/RSV plus assay is intended as an aid in the diagnosis of influenza from Nasopharyngeal swab specimens and should not be used as a sole basis for treatment. Nasal washings and aspirates are unacceptable for Xpert Xpress SARS-CoV-2/FLU/RSV testing.  Fact Sheet for Patients: bloggercourse.com  Fact Sheet for Healthcare Providers: seriousbroker.it  This test is not yet approved or cleared by the [place]  FDA and has been authorized for detection and/or diagnosis of SARS-CoV-2 by FDA under an Emergency Use Authorization (EUA). This EUA will remain in effect (meaning  this test can be used) for the duration of the COVID-19 declaration under Section 564(b)(1) of the Act, 21 U.S.C. section 360bbb-3(b)(1), unless the authorization is terminated or revoked.     Resp Syncytial Virus by PCR NEGATIVE NEGATIVE Final    Comment: (NOTE) Fact Sheet for Patients: bloggercourse.com  Fact Sheet for Healthcare Providers: seriousbroker.it  This test is not yet approved or cleared by the [place]  FDA and has been authorized for detection and/or diagnosis of SARS-CoV-2 by FDA under an Emergency Use Authorization (EUA). This EUA will remain in effect (meaning this test can be used) for the duration of the COVID-19 declaration under Section 564(b)(1) of the Act, 21 U.S.C. section 360bbb-3(b)(1), unless the authorization is terminated or revoked.  Performed at [facility], [address] [place], [place] [address]   Blood culture (routine x 2)     Status: None (Preliminary result)   Collection Time: 12/17/23  9:09 PM   Specimen: BLOOD  Result Value Ref Range Status   Specimen Description BLOOD LEFT ANTECUBITAL  Final   Special Requests   Final    BOTTLES DRAWN AEROBIC AND ANAEROBIC Blood Culture adequate volume   Culture   Final    NO GROWTH 4 DAYS Performed at [facility], [address], [place], [place] [address]    Report Status PENDING  Incomplete  Blood culture (routine x 2)     Status: None (Preliminary result)   Collection Time: 12/17/23  9:14 PM   Specimen: BLOOD RIGHT HAND  Result Value Ref Range Status   Specimen Description BLOOD RIGHT HAND  Final   Special Requests   Final    BOTTLES DRAWN AEROBIC AND ANAEROBIC Blood Culture results may not be optimal due to an inadequate volume of blood received in culture bottles   Culture   Final    NO GROWTH 4 DAYS Performed at [facility], [address], [place],  [place] [address]    Report Status PENDING  Incomplete      Time coordinating discharge: Over 30 minutes  SIGNED:   Darcel Dawley, MD  [facility] 12/21/2023, 4:10 PM Pager   If 7PM-7AM, please contact night-coverage     [1]  Allergies Allergen Reactions   Other Other (See Comments)    Per patient- cardiac cath dye-  woke up during procedure hysterical.   Phenergan  [Promethazine ] Other (See Comments)    Syncope  Mood changes   Topamax [Topiramate] Other (See Comments)    Hallucinations    Ultram  [Tramadol ] Other (See Comments)    Greatly increased blood sugar

## 2023-12-22 LAB — CULTURE, BLOOD (ROUTINE X 2)
Culture: NO GROWTH
Culture: NO GROWTH
Special Requests: ADEQUATE

## 2023-12-25 NOTE — Patient Outreach (Signed)
 Care Coordination   12/25/2023 Name: Steve Brown MRN: [id number] DOB: October 22, 1940   Care Coordination Outreach Attempts:  An unsuccessful telephone outreach was attempted today to complete CCM follow-up visit after recent hospitalization.  Follow Up Plan:  Additional outreach attempts will be made to complete follow-up visit. Patient previously scheduled for follow-up 12/28/23, will keep appointment unless able to reach patient prior to.   Encounter Outcome:  No Answer. Mailbox full, unable to leave message. Message sent via MyChart requesting return call.   Rosaline Finlay, RN MSN [place]  [facility] RN Care Manager Direct Dial: [phone]  Fax: [phone]

## 2023-12-25 NOTE — Patient Instructions (Addendum)
 Steve Brown - I am sorry I was unable to reach you today. I work with [employer] and am calling to support your healthcare needs. Please contact me at [phone] at your earliest convenience. I look forward to speaking with you soon.   Thank you,  Rosaline Finlay, RN MSN [place]  [facility] RN Care Manager Direct Dial: [phone]  Fax: [phone]

## 2023-12-27 NOTE — Patient Outreach (Signed)
 Care Coordination   12/27/2023 Name: Steve Brown MRN: [id number] DOB: 10-Jul-1940   Care Coordination Outreach Attempts:  A second unsuccessful telephone outreach was attempted today to complete CCM follow-up visit after recent hospitalization.  Follow Up Plan:  Additional outreach attempts will be made to complete follow-up visit. Patient previously scheduled for follow-up 12/28/23, will keep appointment unless able to reach patient prior to.   Encounter Outcome:  No Answer. Mailbox full, unable to leave message. Message sent via MyChart requesting return call.   Rosaline Finlay, RN MSN [place]  [facility] RN Care Manager Direct Dial: [phone]  Fax: [phone]

## 2023-12-27 NOTE — Patient Instructions (Signed)
 Marsa Molly - I am sorry I was unable to reach you today. I work with [employer] and am calling to support your healthcare needs. Please contact me at [phone] at your earliest convenience. I look forward to speaking with you soon.   Thank you,  Rosaline Finlay, RN MSN [place]  [facility] RN Care Manager Direct Dial: [phone]  Fax: [phone]

## 2023-12-28 ENCOUNTER — Telehealth

## 2023-12-28 ENCOUNTER — Other Ambulatory Visit: Payer: Self-pay

## 2023-12-28 ENCOUNTER — Emergency Department (HOSPITAL_COMMUNITY)

## 2023-12-28 ENCOUNTER — Inpatient Hospital Stay (HOSPITAL_COMMUNITY)
Admission: EM | Admit: 2023-12-28 | Discharge: 2024-01-10 | DRG: 193 | Disposition: A | Discharge status: Skilled Nursing Facility | Attending: Internal Medicine | Admitting: Internal Medicine

## 2023-12-28 ENCOUNTER — Encounter (HOSPITAL_COMMUNITY): Payer: Self-pay

## 2023-12-28 DIAGNOSIS — F0393 Unspecified dementia, unspecified severity, with mood disturbance: Secondary | ICD-10-CM | POA: Diagnosis present

## 2023-12-28 DIAGNOSIS — N1832 Chronic kidney disease, stage 3b: Secondary | ICD-10-CM | POA: Diagnosis present

## 2023-12-28 DIAGNOSIS — Z955 Presence of coronary angioplasty implant and graft: Secondary | ICD-10-CM

## 2023-12-28 DIAGNOSIS — J189 Pneumonia, unspecified organism: Principal | ICD-10-CM | POA: Diagnosis present

## 2023-12-28 DIAGNOSIS — I2510 Atherosclerotic heart disease of native coronary artery without angina pectoris: Secondary | ICD-10-CM | POA: Diagnosis present

## 2023-12-28 DIAGNOSIS — Z79899 Other long term (current) drug therapy: Secondary | ICD-10-CM

## 2023-12-28 DIAGNOSIS — E785 Hyperlipidemia, unspecified: Secondary | ICD-10-CM | POA: Diagnosis present

## 2023-12-28 DIAGNOSIS — Z8546 Personal history of malignant neoplasm of prostate: Secondary | ICD-10-CM

## 2023-12-28 DIAGNOSIS — E1165 Type 2 diabetes mellitus with hyperglycemia: Secondary | ICD-10-CM | POA: Diagnosis present

## 2023-12-28 DIAGNOSIS — E1122 Type 2 diabetes mellitus with diabetic chronic kidney disease: Secondary | ICD-10-CM | POA: Diagnosis present

## 2023-12-28 DIAGNOSIS — F0394 Unspecified dementia, unspecified severity, with anxiety: Secondary | ICD-10-CM | POA: Diagnosis present

## 2023-12-28 DIAGNOSIS — Z794 Long term (current) use of insulin: Secondary | ICD-10-CM

## 2023-12-28 DIAGNOSIS — Z9841 Cataract extraction status, right eye: Secondary | ICD-10-CM

## 2023-12-28 DIAGNOSIS — Z86711 Personal history of pulmonary embolism: Secondary | ICD-10-CM | POA: Diagnosis not present

## 2023-12-28 DIAGNOSIS — F32A Depression, unspecified: Secondary | ICD-10-CM | POA: Diagnosis present

## 2023-12-28 DIAGNOSIS — G9340 Encephalopathy, unspecified: Secondary | ICD-10-CM | POA: Diagnosis present

## 2023-12-28 DIAGNOSIS — I480 Paroxysmal atrial fibrillation: Secondary | ICD-10-CM | POA: Diagnosis present

## 2023-12-28 DIAGNOSIS — Z981 Arthrodesis status: Secondary | ICD-10-CM

## 2023-12-28 DIAGNOSIS — Z823 Family history of stroke: Secondary | ICD-10-CM

## 2023-12-28 DIAGNOSIS — N400 Enlarged prostate without lower urinary tract symptoms: Secondary | ICD-10-CM | POA: Diagnosis present

## 2023-12-28 DIAGNOSIS — A6002 Herpesviral infection of other male genital organs: Secondary | ICD-10-CM | POA: Diagnosis present

## 2023-12-28 DIAGNOSIS — Z751 Person awaiting admission to adequate facility elsewhere: Secondary | ICD-10-CM | POA: Diagnosis not present

## 2023-12-28 DIAGNOSIS — I482 Chronic atrial fibrillation, unspecified: Secondary | ICD-10-CM | POA: Diagnosis present

## 2023-12-28 DIAGNOSIS — G894 Chronic pain syndrome: Secondary | ICD-10-CM | POA: Diagnosis present

## 2023-12-28 DIAGNOSIS — Z91128 Patient's intentional underdosing of medication regimen for other reason: Secondary | ICD-10-CM

## 2023-12-28 DIAGNOSIS — M6250 Muscle wasting and atrophy, not elsewhere classified, unspecified site: Secondary | ICD-10-CM | POA: Diagnosis present

## 2023-12-28 DIAGNOSIS — Z7901 Long term (current) use of anticoagulants: Secondary | ICD-10-CM | POA: Diagnosis not present

## 2023-12-28 DIAGNOSIS — I129 Hypertensive chronic kidney disease with stage 1 through stage 4 chronic kidney disease, or unspecified chronic kidney disease: Secondary | ICD-10-CM | POA: Diagnosis present

## 2023-12-28 DIAGNOSIS — R531 Weakness: Secondary | ICD-10-CM

## 2023-12-28 DIAGNOSIS — E1100 Type 2 diabetes mellitus with hyperosmolarity without nonketotic hyperglycemic-hyperosmolar coma (NKHHC): Principal | ICD-10-CM

## 2023-12-28 DIAGNOSIS — Z9842 Cataract extraction status, left eye: Secondary | ICD-10-CM

## 2023-12-28 DIAGNOSIS — K219 Gastro-esophageal reflux disease without esophagitis: Secondary | ICD-10-CM | POA: Diagnosis present

## 2023-12-28 DIAGNOSIS — R627 Adult failure to thrive: Secondary | ICD-10-CM | POA: Diagnosis present

## 2023-12-28 DIAGNOSIS — E860 Dehydration: Secondary | ICD-10-CM | POA: Diagnosis present

## 2023-12-28 DIAGNOSIS — T383X6A Underdosing of insulin and oral hypoglycemic [antidiabetic] drugs, initial encounter: Secondary | ICD-10-CM | POA: Diagnosis present

## 2023-12-28 DIAGNOSIS — Z820 Family history of epilepsy and other diseases of the nervous system: Secondary | ICD-10-CM

## 2023-12-28 DIAGNOSIS — M129 Arthropathy, unspecified: Secondary | ICD-10-CM | POA: Diagnosis present

## 2023-12-28 DIAGNOSIS — G4733 Obstructive sleep apnea (adult) (pediatric): Secondary | ICD-10-CM | POA: Diagnosis present

## 2023-12-28 DIAGNOSIS — Z961 Presence of intraocular lens: Secondary | ICD-10-CM | POA: Diagnosis present

## 2023-12-28 DIAGNOSIS — E118 Type 2 diabetes mellitus with unspecified complications: Secondary | ICD-10-CM | POA: Diagnosis present

## 2023-12-28 DIAGNOSIS — G9341 Metabolic encephalopathy: Secondary | ICD-10-CM | POA: Diagnosis present

## 2023-12-28 DIAGNOSIS — Z8673 Personal history of transient ischemic attack (TIA), and cerebral infarction without residual deficits: Secondary | ICD-10-CM

## 2023-12-28 DIAGNOSIS — Z8701 Personal history of pneumonia (recurrent): Secondary | ICD-10-CM

## 2023-12-28 LAB — ETHANOL: Alcohol, Ethyl (B): <15 mg/dL

## 2023-12-28 LAB — I-STAT CG4 LACTIC ACID, ED
Lactic Acid, Venous: 2.1 mmol/L (ref 0.5–1.9)
Lactic Acid, Venous: 1.6 mmol/L (ref 0.5–1.9)

## 2023-12-28 LAB — CBC WITH DIFFERENTIAL/PLATELET
Abs Immature Granulocytes: 0.01 K/uL (ref 0.00–0.07)
Basophils Absolute: 0 K/uL (ref 0.0–0.1)
Basophils Relative: 1 %
Eosinophils Absolute: 0 K/uL (ref 0.0–0.5)
Eosinophils Relative: 1 %
HCT: 38.9 % — ABNORMAL LOW (ref 39.0–52.0)
Hemoglobin: 13.6 g/dL (ref 13.0–17.0)
Immature Granulocytes: 0 %
Lymphocytes Relative: 27 %
Lymphs Abs: 1.4 K/uL (ref 0.7–4.0)
MCH: 31.9 pg (ref 26.0–34.0)
MCHC: 35 g/dL (ref 30.0–36.0)
MCV: 91.3 fL (ref 80.0–100.0)
Monocytes Absolute: 0.5 K/uL (ref 0.1–1.0)
Monocytes Relative: 10 %
Neutro Abs: 3.2 K/uL (ref 1.7–7.7)
Neutrophils Relative %: 61 %
Platelets: 246 K/uL (ref 150–400)
RBC: 4.26 MIL/uL (ref 4.22–5.81)
RDW: 12.8 % (ref 11.5–15.5)
WBC: 5.2 K/uL (ref 4.0–10.5)
nRBC: 0 % (ref 0.0–0.2)

## 2023-12-28 LAB — COMPREHENSIVE METABOLIC PANEL WITH GFR
ALT: 22 U/L (ref 0–44)
AST: 45 U/L — ABNORMAL HIGH (ref 15–41)
Albumin: 3.9 g/dL (ref 3.5–5.0)
Alkaline Phosphatase: 80 U/L (ref 38–126)
Anion gap: 16 — ABNORMAL HIGH (ref 5–15)
BUN: 28 mg/dL — ABNORMAL HIGH (ref 8–23)
CO2: 24 mmol/L (ref 22–32)
Calcium: 10 mg/dL (ref 8.9–10.3)
Chloride: 98 mmol/L (ref 98–111)
Creatinine, Ser: 1.65 mg/dL — ABNORMAL HIGH (ref 0.61–1.24)
GFR, Estimated: 41 mL/min — ABNORMAL LOW
Glucose, Bld: 417 mg/dL — ABNORMAL HIGH (ref 70–99)
Potassium: 4.5 mmol/L (ref 3.5–5.1)
Sodium: 139 mmol/L (ref 135–145)
Total Bilirubin: 0.8 mg/dL (ref 0.0–1.2)
Total Protein: 7.4 g/dL (ref 6.5–8.1)

## 2023-12-28 LAB — CBG MONITORING, ED
Glucose-Capillary: 166 mg/dL — ABNORMAL HIGH (ref 70–99)
Glucose-Capillary: 198 mg/dL — ABNORMAL HIGH (ref 70–99)
Glucose-Capillary: 204 mg/dL — ABNORMAL HIGH (ref 70–99)
Glucose-Capillary: 317 mg/dL — ABNORMAL HIGH (ref 70–99)
Glucose-Capillary: 340 mg/dL — ABNORMAL HIGH (ref 70–99)
Glucose-Capillary: 387 mg/dL — ABNORMAL HIGH (ref 70–99)

## 2023-12-28 LAB — BETA-HYDROXYBUTYRIC ACID: Beta-Hydroxybutyric Acid: 3.37 mmol/L — ABNORMAL HIGH (ref 0.05–0.27)

## 2023-12-28 LAB — URINE DRUG SCREEN
Amphetamines: NEGATIVE
Barbiturates: NEGATIVE
Benzodiazepines: NEGATIVE
Cocaine: NEGATIVE
Fentanyl: NEGATIVE
Methadone Scn, Ur: NEGATIVE
Opiates: NEGATIVE
Tetrahydrocannabinol: NEGATIVE

## 2023-12-28 LAB — AMMONIA: Ammonia: 17 umol/L (ref 9–35)

## 2023-12-28 LAB — OSMOLALITY: Osmolality: 322 mosm/kg (ref 275–295)

## 2023-12-28 MED ORDER — DEXTROSE IN LACTATED RINGERS 5 % IV SOLN: INTRAVENOUS | Status: DC

## 2023-12-28 MED ORDER — TAMSULOSIN HCL 0.4 MG PO CAPS
0.4000 mg | ORAL_CAPSULE | Freq: Every day | ORAL | Status: DC
  Administered 2023-12-29 – 2024-01-03 (×6): 0.4 mg via ORAL
  Filled 2023-12-28 (×6): qty 1

## 2023-12-28 MED ORDER — ACETAMINOPHEN 500 MG PO TABS
1000.0000 mg | ORAL_TABLET | Freq: Once | ORAL | Status: AC
  Administered 2023-12-28: 1000 mg via ORAL
  Filled 2023-12-28: qty 2

## 2023-12-28 MED ORDER — MIRTAZAPINE 15 MG PO TABS
7.5000 mg | ORAL_TABLET | Freq: Every evening | ORAL | Status: DC | PRN
  Administered 2024-01-02 – 2024-01-09 (×4): 7.5 mg via ORAL
  Filled 2023-12-28 (×2): qty 1

## 2023-12-28 MED ORDER — ESCITALOPRAM OXALATE 20 MG PO TABS
20.0000 mg | ORAL_TABLET | Freq: Every morning | ORAL | Status: DC
  Administered 2023-12-29 – 2024-01-10 (×13): 20 mg via ORAL
  Filled 2023-12-28 (×9): qty 1

## 2023-12-28 MED ORDER — LACTATED RINGERS IV BOLUS
20.0000 mL/kg | Freq: Once | INTRAVENOUS | Status: AC
  Administered 2023-12-28: 1564 mL via INTRAVENOUS

## 2023-12-28 MED ORDER — LIDOCAINE 5 % EX PTCH
1.0000 | MEDICATED_PATCH | CUTANEOUS | Status: DC
  Administered 2023-12-29 – 2024-01-09 (×12): 1 via TRANSDERMAL
  Filled 2023-12-28 (×9): qty 1

## 2023-12-28 MED ORDER — ONDANSETRON HCL 4 MG PO TABS: 4.0000 mg | ORAL_TABLET | Freq: Four times a day (QID) | ORAL | Status: DC | PRN

## 2023-12-28 MED ORDER — ONDANSETRON HCL 4 MG/2ML IJ SOLN: 4.0000 mg | Freq: Four times a day (QID) | INTRAMUSCULAR | Status: DC | PRN

## 2023-12-28 MED ORDER — BISACODYL 5 MG PO TBEC
5.0000 mg | DELAYED_RELEASE_TABLET | Freq: Every day | ORAL | Status: DC | PRN
  Administered 2023-12-31: 5 mg via ORAL
  Filled 2023-12-28: qty 1

## 2023-12-28 MED ORDER — HYDROCODONE-ACETAMINOPHEN 5-325 MG PO TABS
1.0000 | ORAL_TABLET | Freq: Four times a day (QID) | ORAL | Status: DC | PRN
  Administered 2023-12-29 – 2024-01-10 (×16): 1 via ORAL
  Filled 2023-12-28 (×12): qty 1

## 2023-12-28 MED ORDER — SODIUM CHLORIDE 0.9 % IV BOLUS
1000.0000 mL | Freq: Once | INTRAVENOUS | Status: AC
  Administered 2023-12-28: 1000 mL via INTRAVENOUS

## 2023-12-28 MED ORDER — LACTATED RINGERS IV SOLN: INTRAVENOUS | Status: DC

## 2023-12-28 MED ORDER — DONEPEZIL HCL 10 MG PO TABS
10.0000 mg | ORAL_TABLET | Freq: Every day | ORAL | Status: DC
  Administered 2023-12-29 – 2024-01-09 (×13): 10 mg via ORAL
  Filled 2023-12-28 (×3): qty 1
  Filled 2023-12-28: qty 2
  Filled 2023-12-28 (×6): qty 1

## 2023-12-28 MED ORDER — SENNOSIDES-DOCUSATE SODIUM 8.6-50 MG PO TABS: 1.0000 | ORAL_TABLET | Freq: Every evening | ORAL | Status: DC | PRN

## 2023-12-28 MED ORDER — DICLOFENAC SODIUM 1 % EX GEL
4.0000 g | Freq: Four times a day (QID) | CUTANEOUS | Status: DC | PRN
  Administered 2023-12-31 – 2024-01-07 (×2): 4 g via TOPICAL
  Filled 2023-12-28: qty 100

## 2023-12-28 MED ORDER — AZITHROMYCIN 250 MG PO TABS
500.0000 mg | ORAL_TABLET | Freq: Once | ORAL | Status: AC
  Administered 2023-12-28: 500 mg via ORAL
  Filled 2023-12-28: qty 2

## 2023-12-28 MED ORDER — PANTOPRAZOLE SODIUM 40 MG PO TBEC
40.0000 mg | DELAYED_RELEASE_TABLET | Freq: Every day | ORAL | Status: DC
  Administered 2023-12-29 – 2024-01-10 (×13): 40 mg via ORAL
  Filled 2023-12-28 (×9): qty 1

## 2023-12-28 MED ORDER — APIXABAN 5 MG PO TABS
5.0000 mg | ORAL_TABLET | Freq: Two times a day (BID) | ORAL | Status: DC
  Administered 2023-12-29 – 2024-01-10 (×26): 5 mg via ORAL
  Filled 2023-12-28 (×19): qty 1

## 2023-12-28 MED ORDER — DEXTROSE 50 % IV SOLN: 0.0000 mL | INTRAVENOUS | Status: DC | PRN

## 2023-12-28 MED ORDER — POTASSIUM CHLORIDE 10 MEQ/100ML IV SOLN
10.0000 meq | INTRAVENOUS | Status: AC
  Administered 2023-12-28 (×2): 10 meq via INTRAVENOUS
  Filled 2023-12-28 (×2): qty 100

## 2023-12-28 MED ORDER — FINASTERIDE 5 MG PO TABS
5.0000 mg | ORAL_TABLET | Freq: Every day | ORAL | Status: DC
  Administered 2023-12-29 – 2024-01-10 (×13): 5 mg via ORAL
  Filled 2023-12-28 (×9): qty 1

## 2023-12-28 MED ORDER — AZITHROMYCIN 250 MG PO TABS: 500.0000 mg | ORAL_TABLET | Freq: Every day | ORAL | Status: DC

## 2023-12-28 MED ORDER — ACETAMINOPHEN 325 MG PO TABS
650.0000 mg | ORAL_TABLET | Freq: Four times a day (QID) | ORAL | Status: DC | PRN
  Administered 2024-01-01 – 2024-01-03 (×2): 650 mg via ORAL
  Filled 2023-12-28 (×2): qty 2

## 2023-12-28 MED ORDER — SODIUM CHLORIDE 0.9 % IV SOLN
1.0000 g | Freq: Once | INTRAVENOUS | Status: AC
  Administered 2023-12-28: 1 g via INTRAVENOUS
  Filled 2023-12-28: qty 10

## 2023-12-28 MED ORDER — BUPROPION HCL ER (XL) 150 MG PO TB24
150.0000 mg | ORAL_TABLET | Freq: Every day | ORAL | Status: DC
  Administered 2023-12-29 – 2024-01-10 (×13): 150 mg via ORAL
  Filled 2023-12-28 (×9): qty 1

## 2023-12-28 MED ORDER — SODIUM CHLORIDE 0.9 % IV SOLN: 1.0000 g | INTRAVENOUS | Status: DC

## 2023-12-28 MED ORDER — INSULIN REGULAR(HUMAN) IN NACL 100-0.9 UT/100ML-% IV SOLN
INTRAVENOUS | Status: DC
  Administered 2023-12-28: 11.5 [IU]/h via INTRAVENOUS
  Filled 2023-12-28: qty 100

## 2023-12-28 MED ORDER — ACETAMINOPHEN 650 MG RE SUPP: 650.0000 mg | Freq: Four times a day (QID) | RECTAL | Status: DC | PRN

## 2023-12-28 MED ORDER — ROSUVASTATIN CALCIUM 10 MG PO TABS
40.0000 mg | ORAL_TABLET | Freq: Every day | ORAL | Status: DC
  Administered 2023-12-29 – 2024-01-10 (×13): 40 mg via ORAL
  Filled 2023-12-28 (×9): qty 4

## 2023-12-28 MED ORDER — VALACYCLOVIR HCL 500 MG PO TABS
500.0000 mg | ORAL_TABLET | Freq: Every day | ORAL | Status: DC
  Administered 2023-12-29 – 2024-01-06 (×9): 500 mg via ORAL
  Filled 2023-12-28 (×9): qty 1

## 2023-12-28 NOTE — ED Notes (Signed)
Gave pt urinal for urine sample

## 2023-12-28 NOTE — H&P (Signed)
 " History and Physical  Jacquez Sheetz FMW:[id number] DOB: March 26, 1940 DOA: 12/28/2023  PCP: [facility]   Chief Complaint: Altered mental status, generalized weakness  HPI: Klay Sobotka is a 83 y.o. male with medical history significant for dementia, A-fib on Eliquis , BPH, T2DM, GERD, CVA, CAD s/p stenting, DDD, prostate cancer, CKD 3B, anxiety, depression and a recent hospitalization for pneumonia and acute encephalopathy who presented to the ED via EMS for evaluation of altered mental status and generalized weakness.  According to EMS, patient lives alone in an apartment complex in the maintenance noticed patient was weak and altered so he called EMS.  According to daughter, patient has a history of not eating or taking his meds when he is in pain.  Reports that patient has not taken his meds or eating in the last 3 days. Patient reports worsening in his left shoulder pain and a headache but denies any trauma or injury to the shoulder.  He endorses feeling weak but denies any fevers, chills, shortness of breath, chest pain, cough, abdominal pain, nausea, vomiting or dysuria.  He is able to tell me his full name, DOB, identify daughter, current location, current president but not the month or the year.  ED Course: Initial vitals show patient afebrile, but hypertensive with SBP in the 140s to 160s. Initial labs significant for glucose 417, BUN/creatinine 28/1.65, lactic acid 2.1-12.6, normal CBC, ethanol level, ammonia and UDS. CT head with no acute intracranial abnormality. CXR shows improved left lung base region with decreasing opacity. Pt received Tylenol , IV Rocephin , azithromycin , IVF bolus and started on insulin  drip. TRH was consulted for admission.   Review of Systems: Please see HPI for pertinent positives and negatives. A complete 10 system review of systems are otherwise negative.  Past Medical History:  Diagnosis Date   Abnormal prostate biopsy    Anticoagulant long-term  use    currently xarelto    Anxiety    BPH with elevated PSA    CKD (chronic kidney disease), stage II    Complication of anesthesia    limted neck rom limited use of left arm due to cva   Coronary artery disease    CARDIOLOGIST-  DR DANN--  2010-- PCI w/ stenting midLAD   DDD (degenerative disc disease), lumbar    Degeneration of cervical intervertebral disc    Depression    Diabetes mellitus without complication (HCC)    Type 2   Dyspnea on exertion    GERD (gastroesophageal reflux disease)    Hemiparesis due to cerebral infarction    History of cerebrovascular accident (CVA) with residual deficit 2002 and 2003--  hemiparisis both sides   per MRI  anterior left frontal lobe, left para midline pons, and inferior cerebullam bilaterally infarcts   History of pulmonary embolus (PE)    06-30-2012  extensive bilaterally   History of recurrent TIAs    History of syncope    hx multiple pre-syncope and syncopal episodes due to vasovagal, orthostatic hypotension, dehydration   History of TIAs    several since 2002   Hyperlipidemia    Hypertension    Mild atherosclerosis of carotid artery, bilateral    per last duplex 11-04-2014  bilateral ICA 1--39%   Neuropathy    fingers   OSA on CPAP    uses most nights; followed by dr dohmeier--  sev. osa w/ AHI 65.9   Pneumonia    x 1   Prostate cancer (HCC) dx 2018   Renal insufficiency  S/P coronary artery stent placement 2010   stenting to mid LAD   Sensorineural hearing loss, bilateral 04/01/2021   no hearing aids   Simple renal cyst    bilaterally   Stroke [facility])    Trigger finger of both hands 11/17/2013   Type 2 diabetes mellitus (HCC) dx 1986   last one A1c 9.2 on 04-26-2016   Unsteady gait    uses straight cane and occasional walker. Hx prior CVA/TIAs;   Vertebral artery occlusion, left    chronic   Past Surgical History:  Procedure Laterality Date   ANTERIOR CERVICAL DECOMP/DISCECTOMY FUSION  2004   C3 -- C6 limited  rom   ARTERY BIOPSY Right 04/21/2022   Procedure: BIOPSY TEMPORAL ARTERY;  Surgeon: Gretta Lonni PARAS, MD;  Location: [facility] OR;  Service: Vascular;  Laterality: Right;   CARDIAC CATHETERIZATION  06-10-2010   dr dann   wide patent LAD stent, mid lesion at the origin of the septal prior to the previous stent 40-50%/  normal LVF, ef 55%   CARDIOVASCULAR STRESS TEST  10-23-2012  dr dann   normal nuclear perfusion study w/ no ischemia/  normal LV function and wall motion , ef 65%   CARPAL TUNNEL RELEASE Bilateral    CATARACT EXTRACTION W/ INTRAOCULAR LENS  IMPLANT, BILATERAL     CHOLECYSTECTOMY N/A 11/02/2015   Procedure: LAPAROSCOPIC CHOLECYSTECTOMY WITH INTRAOPERATIVE CHOLANGIOGRAM;  Surgeon: Donnice Lima, MD;  Location: [facility] OR;  Service: General;  Laterality: N/A;   COLONOSCOPY     CORONARY ANGIOPLASTY WITH STENT PLACEMENT  02/2008   stenting to mid LAD   GOLD SEED IMPLANT N/A 11/15/2016   Procedure: GOLD SEED IMPLANT TIMES THREE;  Surgeon: Cam Morene ORN, MD;  Location: [facility];  Service: Urology;  Laterality: N/A;   IR ANGIO INTRA EXTRACRAN SEL COM CAROTID INNOMINATE BILAT MOD SED  06/13/2018   IR ANGIO VERTEBRAL SEL VERTEBRAL UNI R MOD SED  06/13/2018   IR US  GUIDE VASC ACCESS RIGHT  06/13/2018   LEFT HEART CATH AND CORONARY ANGIOGRAPHY N/A 05/25/2017   Procedure: LEFT HEART CATH AND CORONARY ANGIOGRAPHY;  Surgeon: Dann Candyce RAMAN, MD;  Location: [facility] INVASIVE CV LAB;  Service: Cardiovascular;  Laterality: N/A;   LEFT HEART CATHETERIZATION WITH CORONARY ANGIOGRAM N/A 04/03/2013   Procedure: LEFT HEART CATHETERIZATION WITH CORONARY ANGIOGRAM;  Surgeon: Candyce RAMAN Dann, MD;  Location: [facility] CATH LAB;  Service: Cardiovascular;  Laterality: N/A;  patent mLAD stent  w/ mild disease in remainder LAD and its branches;  mod. focal lesion midLCFx- FFR of lesion was negative for ischemia/  normal LVSF, ef 50%   lungs  2005   fluid pumped off lungs   NEUROPLASTY /  TRANSPOSITION ULNAR NERVE AT ELBOW Right 2004   PROSTATE BIOPSY N/A 08/31/2016   Procedure: PROSTATE  BIOPSY TRANSRECTAL ULTRASONIC PROSTATE (TUBP);  Surgeon: Cam Morene ORN, MD;  Location: [facility];  Service: Urology;  Laterality: N/A;   SPACE OAR INSTILLATION N/A 11/15/2016   Procedure: SPACE OAR INSTILLATION;  Surgeon: Cam Morene ORN, MD;  Location: [facility];  Service: Urology;  Laterality: N/A;   TRANSTHORACIC ECHOCARDIOGRAM  04/27/2016   severe focal basal LVH, ef 60-65%,  grade 2 diastoilc dysfunction/  mild AR, MR, and TR/  atrial septum lipomatous hypertrophy/  PASP   UMBILICAL HERNIA REPAIR     Social History:  reports that he has never smoked. He has never been exposed to tobacco smoke. He has never used smokeless  tobacco. He reports that he does not currently use alcohol. He reports that he does not use drugs.  Allergies[1]  Family History  Problem Relation Age of Onset   Aneurysm Mother    Cancer Father        unknown either pancreatic or prostate   Dementia Sister    Stroke Brother    Stroke Daughter    Seizures Daughter    Multiple sclerosis Granddaughter    Heart attack Neg Hx    Sleep apnea Neg Hx      Prior to Admission medications  Medication Sig Start Date End Date Taking? Authorizing Provider  apixaban  (ELIQUIS ) 5 MG TABS tablet Take 5 mg by mouth 2 (two) times daily. 08/31/23   [provider]  buPROPion  (WELLBUTRIN  XL) 150 MG 24 hr tablet Take 150 mg by mouth daily.    [provider]  Cholecalciferol  (VITAMIN D -3 PO) Take 1 tablet by mouth daily.    [provider]  diclofenac  Sodium (VOLTAREN ) 1 % GEL Apply 4 g topically 4 (four) times daily. Patient taking differently: Apply 4 g topically 4 (four) times daily as needed. 09/03/19   Floyd, Dan, DO  donepezil  (ARICEPT ) 10 MG tablet Take 10 mg by mouth daily. 11/17/22   [provider]  escitalopram  (LEXAPRO ) 20 MG tablet Take  20 mg by mouth in the morning.    [provider]  finasteride  (PROSCAR ) 5 MG tablet Take 5 mg by mouth daily.    [provider]  fluticasone  (FLONASE ) 50 MCG/ACT nasal spray Place 2 sprays into both nostrils daily as needed for allergies or rhinitis.    [provider]  furosemide  (LASIX ) 20 MG tablet Take 1-2 tablets (20-40 mg total) by mouth daily as needed for fluid. Patient taking differently: Take 20 mg by mouth daily. 06/13/20   Medina-Vargas, Monina C, NP  HYDROcodone -acetaminophen  (NORCO/VICODIN) 5-325 MG tablet Take 1 tablet by mouth every 6 (six) hours as needed for severe pain (pain score 7-10). 11/10/23   Odell Balls, PA-C  insulin  aspart (NOVOLOG ) 100 UNIT/ML FlexPen Inject 5 Units into the skin 3 (three) times daily with meals. Patient taking differently: Inject 5-8 Units into the skin 3 (three) times daily before meals. 06/13/20   Medina-Vargas, Monina C, NP  ketoconazole  (NIZORAL ) 2 % cream Apply 1 Application topically 2 (two) times daily as needed (skin irritation).    [provider]  lidocaine  (LIDODERM ) 5 % Place 1 patch onto the skin daily. Remove & Discard patch within 12 hours or as directed by MD Patient taking differently: Place 1 patch onto the skin daily as needed (pain). 11/08/23   Theotis Peers M, PA-C  lidocaine  (XYLOCAINE ) 5 % ointment Apply 1 Application topically every 4 (four) hours as needed (pain).    [provider]  methocarbamol  (ROBAXIN ) 500 MG tablet Take 1-2 tablets (500-1,000 mg total) by mouth every 8 (eight) hours as needed for muscle spasms. 10/26/23   Trine Raynell Moder, MD  mirtazapine  (REMERON ) 15 MG tablet Take 7.5 mg by mouth at bedtime as needed (sleep, anxiety, nightmares).    [provider]  Multiple Vitamins-Minerals (MULTIVITAMIN MEN 50+) TABS Take 1 tablet by mouth daily.    [provider]  pantoprazole  (PROTONIX ) 40 MG tablet Take 40 mg by mouth daily.    [provider]  pregabalin  (LYRICA ) 300 MG capsule Take 300 mg by mouth 2 (two) times daily. 04/26/20   [provider]  rosuvastatin  (CRESTOR ) 40 MG tablet  Take 40 mg by mouth daily.    [provider]  tamsulosin  (FLOMAX ) 0.4 MG CAPS capsule Take 1 capsule (0.4 mg total) by mouth daily after supper. 09/30/23 09/29/24  Von Bellis, MD  TRESIBA  FLEXTOUCH 100 UNIT/ML FlexTouch Pen Inject 34 Units into the skin daily. Patient taking differently: Inject 26 Units into the skin daily. 06/13/20   Medina-Vargas, Monina C, NP  valACYclovir  (VALTREX ) 500 MG tablet Take 500 mg by mouth daily.    [provider]    Physical Exam: BP (!) 146/104   Pulse 79   Temp 97.7 F (36.5 C) (Oral)   Resp 20   Ht 5' 5 (1.651 m)   Wt 78.2 kg   SpO2 95%   BMI 28.69 kg/m  General: Pleasant, chronically ill and weak appearing laying in bed. No acute distress. HEENT: [place]/AT. Anicteric sclera.  Dry mucous membrane. CV: RRR. No murmurs, rubs, or gallops. No LE edema Pulmonary: Lungs CTAB. Normal effort. No wheezing or rales. Abdominal: Soft, nontender, nondistended. Normal bowel sounds. Extremities: Palpable radial and DP pulses. Normal ROM. Skin: Warm and dry. No obvious rash or lesions. Neuro: Alert and oriented to self, person and place but not to time.  Generalized weakness. Moves all extremities. Normal sensation to light touch. No focal deficit. Psych: Normal mood and affect          Labs on Admission:  Basic Metabolic Panel: Recent Labs  Lab 12/28/23 1637  NA 139  K 4.5  CL 98  CO2 24  GLUCOSE 417*  BUN 28*  CREATININE 1.65*  CALCIUM  10.0   Liver Function Tests: Recent Labs  Lab 12/28/23 1637  AST 45*  ALT 22  ALKPHOS 80  BILITOT 0.8  PROT 7.4  ALBUMIN  3.9   No results for input(s): LIPASE, AMYLASE in the last 168 hours. Recent Labs  Lab 12/28/23 1637  AMMONIA 17   CBC: Recent Labs  Lab 12/28/23 1637  WBC 5.2  NEUTROABS 3.2  HGB 13.6  HCT  38.9*  MCV 91.3  PLT 246   Cardiac Enzymes: No results for input(s): CKTOTAL, CKMB, CKMBINDEX, TROPONINI in the last 168 hours. BNP (last 3 results) Recent Labs    11/21/23 2010 12/17/23 1758  BNP 541.3* 172.6*    ProBNP (last 3 results) No results for input(s): PROBNP in the last 8760 hours.  CBG: Recent Labs  Lab 12/28/23 1635 12/28/23 1930 12/28/23 2032  GLUCAP 387* 340* 317*    Radiological Exams on Admission: DG Chest 2 View Result Date: 12/28/2023 CLINICAL DATA:  Altered.  Lethargy.  Recent pneumonia diagnosis. EXAM: CHEST - 2 VIEW COMPARISON:  Chest radiograph 12/17/2023 FINDINGS: Improved left lung base aeration with decreasing opacity in obscuration of the hemidiaphragm. No new airspace disease. Stable heart size and mediastinal contours. Aortic atherosclerosis. No pulmonary edema. No pneumothorax. Stable osseous structures. IMPRESSION: Improved left lung base aeration with decreasing opacity. No new airspace disease. Electronically Signed   By: Andrea Gasman M.D.   On: 12/28/2023 17:51   CT Head Wo Contrast Result Date: 12/28/2023 CLINICAL DATA:  Lethargy. EXAM: CT HEAD WITHOUT CONTRAST TECHNIQUE: Contiguous axial images were obtained from the base of the skull through the vertex without intravenous contrast. RADIATION DOSE REDUCTION: This exam was performed according to the departmental dose-optimization program which includes automated exposure control, adjustment of the mA and/or kV according to patient size and/or use of iterative reconstruction technique. COMPARISON:  December 17, 2023 FINDINGS: Brain: There is generalized cerebral atrophy with widening of  the extra-axial spaces and ventricular dilatation. There are areas of decreased attenuation within the white matter tracts of the supratentorial brain, consistent with microvascular disease changes. A small chronic left cerebellar infarct is seen. Vascular: Marked severity bilateral cavernous carotid  artery calcification is noted. Skull: Normal. Negative for fracture or focal lesion. Sinuses/Orbits: There is marked severity sphenoid sinus and left ethmoid sinus mucosal thickening. Other: A stable left frontal scalp lipoma is seen. IMPRESSION: 1. Generalized cerebral atrophy with chronic white matter small vessel ischemic changes. 2. Small chronic left cerebellar infarct. 3. Marked severity sphenoid sinus and left ethmoid sinus disease. Electronically Signed   By: Suzen Dials M.D.   On: 12/28/2023 16:49   Assessment/Plan Kenn Rekowski is a 83 y.o. male with medical history significant for dementia, A-fib on Eliquis , BPH, T2DM, GERD, CVA, CAD s/p stenting, DDD, CKD 3B, anxiety and depression who presented to the ED via EMS for evaluation of altered mental status and generalized weakness and admitted for acute encephalopathy  # Acute encephalopathy - Patient with dementia who lives alone now presenting with altered mental status for the second time in less than 2 weeks - Patient quite weak appearing with slowed movement, oriented to self, person and place but not to time - Likely combination of severe hyperglycemia, persistent pneumonia and some progression of his dementia - Delirium precaution  # Severe hyperglycemia # Uncontrolled type 2 diabetes - Patient found to have glucose of 417 on admission AG of 16 but no acidosis - Findings due to patient not taking his insulin  over the last few days - Altered mental status on exam but hemodynamically stable - Continue aggressive IV hydration and insulin  drip - When blood sugar less than 200 x 2, start Semglee  34 units daily with SSI and carb modified diet - Follow-up urinalysis, osmolality and BHB - Last A1c 9.4% 3 months ago, follow-up repeat A1c  # Community-acquired pneumonia - X-ray on admission showed improved opacity - Continue IV Rocephin  and oral azithromycin  - Follow-up procalcitonin in the morning - Trend CBC, fever curve  #  HTN - BP elevated with SBP in the 140s to 160s - Continue losartan   # CKD 3B - Creatinine of 1.65 around recent baseline of 1.5-1.9 - Trend renal function and avoid nephrotoxic agents  # Atrial fibrillation - Rate controlled - Continue Eliquis   # Chronic left shoulder pain - Apply lidocaine  patch and continue as needed Norco  # Dementia - Continued donepezil   # BPH - Continue finasteride  and Flomax   # HLD - Continue rosuvastatin   # Anxiety and depression - Continue bupropion , Lexapro  and Remeron   # Generalized weakness and deconditioning - In the setting of acute illness - PT/OT eval and treat - Fall precautions  # Social determinants of health # Failure to thrive - Daughter is concern about patient's ability to care for self over the last few weeks - Family are looking for available resources for patient ([facility] patient) to assist with ADLs and medication - TOC consulted for assistance  DVT prophylaxis: Eliquis     Code Status: Full Code  Consults called: None  Family Communication: Discussed findings/results and plan for admission with daughter at bedside  Severity of Illness: The appropriate patient status for this patient is INPATIENT. Inpatient status is judged to be reasonable and necessary in order to provide the required intensity of service to ensure the patient's safety. The patient's presenting symptoms, physical exam findings, and initial radiographic and laboratory data in the context of their chronic comorbidities  is felt to place them at high risk for further clinical deterioration. Furthermore, it is not anticipated that the patient will be medically stable for discharge from the hospital within 2 midnights of admission.   * I certify that at the point of admission it is my clinical judgment that the patient will require inpatient hospital care spanning beyond 2 midnights from the point of admission due to high intensity of service, high risk for further  deterioration and high frequency of surveillance required.*  Level of care: Stepdown   I personally spent a total of 75 minutes in the care of the patient today including preparing to see the patient, getting/reviewing separately obtained history, performing a medically appropriate exam/evaluation, placing orders, documenting clinical information in the EHR, and communicating results.   Lou Claretta HERO, MD 12/28/2023, 8:46 PM [facility] Pager: [phone] Isaiah 41:10   If 7PM-7AM, please contact night-coverage [email or web address] Password [email or web address]     [1]  Allergies Allergen Reactions   Other Other (See Comments)    Per patient- cardiac cath dye-  woke up during procedure hysterical.   Phenergan  [Promethazine ] Other (See Comments)    Syncope  Mood changes   Topamax [Topiramate] Other (See Comments)    Hallucinations    Ultram  [Tramadol ] Other (See Comments)    Greatly increased blood sugar   "

## 2023-12-28 NOTE — Patient Outreach (Signed)
 Care Coordination   12/28/2023 Name: Steve Brown MRN: [id number] DOB: 09/20/1940   Care Coordination Outreach Attempts:  A third unsuccessful outreach was attempted today to complete CCM follow-up visit.  Follow Up Plan:  No further outreach attempts will be made at this time. We have been unable to contact the patient to complete follow-up visit.  Encounter Outcome:  No Answer. Mailbox full, unable to leave voicemail. Message sent via MyChart requesting return call.   Rosaline Finlay, RN MSN [place]  [facility] Population Health RN Care Manager Direct Dial: [phone]  Fax: [phone]

## 2023-12-28 NOTE — ED Triage Notes (Signed)
 Pt bib EMS from home. Maintance called because pt seemed lethargic. Dx pneumonia 1 week ago. A&Ox2. Pt lives by self so no known baseline. CBG 431 BP 152/86 HR 80 100% RA 24 RR 18g LAC

## 2023-12-28 NOTE — ED Provider Notes (Signed)
 " [place] EMERGENCY DEPARTMENT AT [facility] Provider Note   CSN: [id number] Arrival date & time: 12/28/23  1452     Patient presents with: Fatigue and Pneumonia   Steve Brown is a 83 y.o. male.   The history is provided by the patient, the EMS personnel and medical records. No language interpreter was used.  Pneumonia     83 year old male with extensive past medical history which includes CAD, prior stroke, on Eliquis  blood thinner medication, diabetes, OSA, CKD, hypertension, prostate cancer brought here via EMS from home for evaluation of fatigue.  Per EMS note, patient living in an apartment complex and the main impression noted that he seems to be weak and altered and EMS was called.  Patient was diagnosed with pneumonia a week ago.  I was unable to obtain much history from patient due to his altered mental status.  He is without any complaint at this time.  Level 5 caveat is due to change in mental status.  Prior to Admission medications  Medication Sig Start Date End Date Taking? Authorizing Provider  apixaban  (ELIQUIS ) 5 MG TABS tablet Take 5 mg by mouth 2 (two) times daily. 08/31/23   [provider]  buPROPion  (WELLBUTRIN  XL) 150 MG 24 hr tablet Take 150 mg by mouth daily.    [provider]  Cholecalciferol  (VITAMIN D -3 PO) Take 1 tablet by mouth daily.    [provider]  diclofenac  Sodium (VOLTAREN ) 1 % GEL Apply 4 g topically 4 (four) times daily. Patient taking differently: Apply 4 g topically 4 (four) times daily as needed. 09/03/19   Floyd, Dan, DO  donepezil  (ARICEPT ) 10 MG tablet Take 10 mg by mouth daily. 11/17/22   [provider]  escitalopram  (LEXAPRO ) 20 MG tablet Take 20 mg by mouth in the morning.    [provider]  finasteride  (PROSCAR ) 5 MG tablet Take 5 mg by mouth daily.    [provider]  fluticasone  (FLONASE ) 50 MCG/ACT nasal spray Place 2 sprays into both nostrils daily as needed  for allergies or rhinitis.    [provider]  furosemide  (LASIX ) 20 MG tablet Take 1-2 tablets (20-40 mg total) by mouth daily as needed for fluid. Patient taking differently: Take 20 mg by mouth daily. 06/13/20   Medina-Vargas, Monina C, NP  HYDROcodone -acetaminophen  (NORCO/VICODIN) 5-325 MG tablet Take 1 tablet by mouth every 6 (six) hours as needed for severe pain (pain score 7-10). 11/10/23   Odell Balls, PA-C  insulin  aspart (NOVOLOG ) 100 UNIT/ML FlexPen Inject 5 Units into the skin 3 (three) times daily with meals. Patient taking differently: Inject 5-8 Units into the skin 3 (three) times daily before meals. 06/13/20   Medina-Vargas, Monina C, NP  ketoconazole  (NIZORAL ) 2 % cream Apply 1 Application topically 2 (two) times daily as needed (skin irritation).    [provider]  lidocaine  (LIDODERM ) 5 % Place 1 patch onto the skin daily. Remove & Discard patch within 12 hours or as directed by MD Patient taking differently: Place 1 patch onto the skin daily as needed (pain). 11/08/23   Theotis Peers M, PA-C  lidocaine  (XYLOCAINE ) 5 % ointment Apply 1 Application topically every 4 (four) hours as needed (pain).    [provider]  methocarbamol  (ROBAXIN ) 500 MG tablet Take 1-2 tablets (500-1,000 mg total) by mouth every 8 (eight) hours as needed for muscle spasms. 10/26/23   Trine Raynell Moder, MD  mirtazapine  (REMERON ) 15 MG tablet Take 7.5 mg by  mouth at bedtime as needed (sleep, anxiety, nightmares).    [provider]  Multiple Vitamins-Minerals (MULTIVITAMIN MEN 50+) TABS Take 1 tablet by mouth daily.    [provider]  pantoprazole  (PROTONIX ) 40 MG tablet Take 40 mg by mouth daily.    [provider]  pregabalin  (LYRICA ) 300 MG capsule Take 300 mg by mouth 2 (two) times daily. 04/26/20   [provider]  rosuvastatin  (CRESTOR ) 40 MG tablet Take 40 mg by mouth daily.    [provider]  tamsulosin  (FLOMAX ) 0.4 MG  CAPS capsule Take 1 capsule (0.4 mg total) by mouth daily after supper. 09/30/23 09/29/24  Von Bellis, MD  TRESIBA  FLEXTOUCH 100 UNIT/ML FlexTouch Pen Inject 34 Units into the skin daily. Patient taking differently: Inject 26 Units into the skin daily. 06/13/20   Medina-Vargas, Monina C, NP  valACYclovir  (VALTREX ) 500 MG tablet Take 500 mg by mouth daily.    [provider]    Allergies: Other, Phenergan  [promethazine ], Topamax [topiramate], and Ultram  [tramadol ]    Review of Systems  Unable to perform ROS: Mental status change    Updated Vital Signs Pulse 70   Temp 97.6 F (36.4 C) (Oral)   Ht 5' 5 (1.651 m)   Wt 78.2 kg   SpO2 95%   BMI 28.69 kg/m   Physical Exam Constitutional:      General: He is not in acute distress.    Appearance: He is well-developed.     Comments: Patient laying in bed, eyes open, slow to response but in no acute discomfort.  HENT:     Head: Normocephalic and atraumatic.  Eyes:     Conjunctiva/sclera: Conjunctivae normal.  Cardiovascular:     Rate and Rhythm: Normal rate and regular rhythm.     Pulses: Normal pulses.     Heart sounds: Normal heart sounds.  Pulmonary:     Effort: Pulmonary effort is normal.     Breath sounds: Normal breath sounds.  Abdominal:     Palpations: Abdomen is soft.     Tenderness: There is no abdominal tenderness.  Musculoskeletal:     Cervical back: Normal range of motion and neck supple.     Comments: Global weakness with equal strength throughout  Skin:    Findings: No rash.  Neurological:     Mental Status: He is alert.     Comments: Alert to self and place but not to time and situation.     (all labs ordered are listed, but only abnormal results are displayed) Labs Reviewed  CBC WITH DIFFERENTIAL/PLATELET - Abnormal; Notable for the following components:      Result Value   HCT 38.9 (*)    All other components within normal limits  COMPREHENSIVE METABOLIC PANEL WITH GFR - Abnormal; Notable for  the following components:   Glucose, Bld 417 (*)    BUN 28 (*)    Creatinine, Ser 1.65 (*)    AST 45 (*)    GFR, Estimated 41 (*)    Anion gap 16 (*)    All other components within normal limits  CBG MONITORING, ED - Abnormal; Notable for the following components:   Glucose-Capillary 387 (*)    All other components within normal limits  I-STAT CG4 LACTIC ACID, ED - Abnormal; Notable for the following components:   Lactic Acid, Venous 2.1 (*)    All other components within normal limits  CBG MONITORING, ED - Abnormal; Notable for the following components:   Glucose-Capillary 340 (*)  All other components within normal limits  AMMONIA  URINE DRUG SCREEN  ETHANOL  URINALYSIS, ROUTINE W REFLEX MICROSCOPIC  OSMOLALITY  BETA-HYDROXYBUTYRIC ACID  BETA-HYDROXYBUTYRIC ACID  I-STAT CG4 LACTIC ACID, ED    EKG: None  Radiology: DG Chest 2 View Result Date: 12/28/2023 CLINICAL DATA:  Altered.  Lethargy.  Recent pneumonia diagnosis. EXAM: CHEST - 2 VIEW COMPARISON:  Chest radiograph 12/17/2023 FINDINGS: Improved left lung base aeration with decreasing opacity in obscuration of the hemidiaphragm. No new airspace disease. Stable heart size and mediastinal contours. Aortic atherosclerosis. No pulmonary edema. No pneumothorax. Stable osseous structures. IMPRESSION: Improved left lung base aeration with decreasing opacity. No new airspace disease. Electronically Signed   By: Andrea Gasman M.D.   On: 12/28/2023 17:51   CT Head Wo Contrast Result Date: 12/28/2023 CLINICAL DATA:  Lethargy. EXAM: CT HEAD WITHOUT CONTRAST TECHNIQUE: Contiguous axial images were obtained from the base of the skull through the vertex without intravenous contrast. RADIATION DOSE REDUCTION: This exam was performed according to the departmental dose-optimization program which includes automated exposure control, adjustment of the mA and/or kV according to patient size and/or use of iterative reconstruction technique.  COMPARISON:  December 17, 2023 FINDINGS: Brain: There is generalized cerebral atrophy with widening of the extra-axial spaces and ventricular dilatation. There are areas of decreased attenuation within the white matter tracts of the supratentorial brain, consistent with microvascular disease changes. A small chronic left cerebellar infarct is seen. Vascular: Marked severity bilateral cavernous carotid artery calcification is noted. Skull: Normal. Negative for fracture or focal lesion. Sinuses/Orbits: There is marked severity sphenoid sinus and left ethmoid sinus mucosal thickening. Other: A stable left frontal scalp lipoma is seen. IMPRESSION: 1. Generalized cerebral atrophy with chronic white matter small vessel ischemic changes. 2. Small chronic left cerebellar infarct. 3. Marked severity sphenoid sinus and left ethmoid sinus disease. Electronically Signed   By: Suzen Dials M.D.   On: 12/28/2023 16:49     .Critical Care  Performed by: Nivia Colon, PA-C Authorized by: Nivia Colon, PA-C   Critical care provider statement:    Critical care time (minutes):  30   Critical care was time spent personally by me on the following activities:  Development of treatment plan with patient or surrogate, discussions with consultants, evaluation of patient's response to treatment, examination of patient, ordering and review of laboratory studies, ordering and review of radiographic studies, ordering and performing treatments and interventions, pulse oximetry, re-evaluation of patient's condition and review of old charts    Medications Ordered in the ED  cefTRIAXone  (ROCEPHIN ) 1 g in sodium chloride  0.9 % 100 mL IVPB (has no administration in time range)  azithromycin  (ZITHROMAX ) tablet 500 mg (has no administration in time range)  sodium chloride  0.9 % bolus 1,000 mL (1,000 mLs Intravenous New Bag/Given 12/28/23 1656)                                    Medical Decision Making Amount and/or Complexity of  Data Reviewed Labs: ordered. Radiology: ordered.  Risk OTC drugs. Prescription drug management.   BP (!) 141/70   Pulse 70   Temp 97.6 F (36.4 C) (Oral)   Resp 16   Ht 5' 5 (1.651 m)   Wt 78.2 kg   SpO2 95%   BMI 28.69 kg/m   79:63 PM 83 year old male with extensive past medical history which includes CAD, prior stroke, on Eliquis  blood thinner  medication, diabetes, OSA, CKD, hypertension, prostate cancer brought here via EMS from home for evaluation of fatigue.  Per EMS note, patient living in an apartment complex and the main impression noted that he seems to be weak and altered and EMS was called.  Patient was diagnosed with pneumonia a week ago.  I was unable to obtain much history from patient due to his altered mental status.  He is without any complaint at this time.  Level 5 caveat is due to change in mental status.  On exam, patient is altered, he is alert to self and place but not to time or situation.  He does not exhibit any facial droop or focal deficit.  He does follows command.  He is globally weak.   -Labs ordered, independently viewed and interpreted by me.  Labs remarkable for mildly elevated lactic acid of 2.1.  IV fluid given.  Normal WBC.  CBG is elevated at 417, patient also has an anion gap of 16.  Given his increased confusion and elevated CBG, he is at risk of HSS.  Endo tool started. -The patient was maintained on a cardiac monitor.  I personally viewed and interpreted the cardiac monitored which showed an underlying rhythm of: Sinus rhythm -Imaging independently viewed and interpreted by me and I agree with radiologist's interpretation.  Result remarkable for chest x-ray shows improvement of his lung opacity from prior pneumonia.  Head CT scan without acute finding. -This patient presents to the ED for concern of fatigue, this involves an extensive number of treatment options, and is a complaint that carries with it a high risk of complications and morbidity.   The differential diagnosis includes delirium, infection, stroke, anemia, metabolic derangement, -Co morbidities that complicate the patient evaluation includes diabetes, stroke, CKD, vascular dementia -Treatment includes Endo tool including IV fluid, insulin , Rocephin  and Zithromax  -Reevaluation of the patient after these medicines showed that the patient improved -PCP office notes or outside notes reviewed -Discussion with specialist [facility] Dr. Lou who agrees to admit pt for altered mental status, likely HHS vs. delirium -Escalation to admission/observation considered: pt to be admitted.       Final diagnoses:  Hyperosmolar hyperglycemic state (HHS) [facility])  Community acquired pneumonia, unspecified laterality    ED Discharge Orders     None          Nivia Colon, PA-C 12/28/23 2028  "

## 2023-12-28 NOTE — Patient Instructions (Signed)
 Steve Brown - I have attempted to call you three times but have been unsuccessful in reaching you. I work with [employer]bird and am calling to support your healthcare needs. If I can be of assistance to you, please contact me at [phone].     Thank you,  Rosaline Finlay, RN MSN [place]  [facility] RN Care Manager Direct Dial: [phone]  Fax: [phone]

## 2023-12-29 DIAGNOSIS — E1100 Type 2 diabetes mellitus with hyperosmolarity without nonketotic hyperglycemic-hyperosmolar coma (NKHHC): Secondary | ICD-10-CM

## 2023-12-29 DIAGNOSIS — E1165 Type 2 diabetes mellitus with hyperglycemia: Secondary | ICD-10-CM | POA: Diagnosis not present

## 2023-12-29 DIAGNOSIS — G9340 Encephalopathy, unspecified: Secondary | ICD-10-CM | POA: Diagnosis not present

## 2023-12-29 DIAGNOSIS — J189 Pneumonia, unspecified organism: Secondary | ICD-10-CM | POA: Diagnosis not present

## 2023-12-29 DIAGNOSIS — I480 Paroxysmal atrial fibrillation: Secondary | ICD-10-CM | POA: Diagnosis not present

## 2023-12-29 LAB — CBC
HCT: 34.9 % — ABNORMAL LOW (ref 39.0–52.0)
Hemoglobin: 12 g/dL — ABNORMAL LOW (ref 13.0–17.0)
MCH: 31.7 pg (ref 26.0–34.0)
MCHC: 34.4 g/dL (ref 30.0–36.0)
MCV: 92.1 fL (ref 80.0–100.0)
Platelets: 197 K/uL (ref 150–400)
RBC: 3.79 MIL/uL — ABNORMAL LOW (ref 4.22–5.81)
RDW: 13 % (ref 11.5–15.5)
WBC: 5.3 K/uL (ref 4.0–10.5)
nRBC: 0 % (ref 0.0–0.2)

## 2023-12-29 LAB — HEMOGLOBIN A1C
Hgb A1c MFr Bld: 9.8 % — ABNORMAL HIGH (ref 4.8–5.6)
Mean Plasma Glucose: 234.56 mg/dL

## 2023-12-29 LAB — CBG MONITORING, ED
Glucose-Capillary: 126 mg/dL — ABNORMAL HIGH (ref 70–99)
Glucose-Capillary: 132 mg/dL — ABNORMAL HIGH (ref 70–99)
Glucose-Capillary: 152 mg/dL — ABNORMAL HIGH (ref 70–99)

## 2023-12-29 LAB — URINALYSIS, ROUTINE W REFLEX MICROSCOPIC
Bacteria, UA: NONE SEEN
Bilirubin Urine: NEGATIVE
Glucose, UA: >=500 mg/dL — AB
Ketones, ur: 20 mg/dL — AB
Leukocytes,Ua: NEGATIVE
Nitrite: NEGATIVE
Protein, ur: 30 mg/dL — AB
Specific Gravity, Urine: 1.018 (ref 1.005–1.030)
pH: 5 (ref 5.0–8.0)

## 2023-12-29 LAB — BETA-HYDROXYBUTYRIC ACID: Beta-Hydroxybutyric Acid: 0.07 mmol/L (ref 0.05–0.27)

## 2023-12-29 LAB — GLUCOSE, CAPILLARY
Glucose-Capillary: 245 mg/dL — ABNORMAL HIGH (ref 70–99)
Glucose-Capillary: 149 mg/dL — ABNORMAL HIGH (ref 70–99)
Glucose-Capillary: 65 mg/dL — ABNORMAL LOW (ref 70–99)
Glucose-Capillary: 71 mg/dL (ref 70–99)
Glucose-Capillary: 189 mg/dL — ABNORMAL HIGH (ref 70–99)

## 2023-12-29 LAB — BASIC METABOLIC PANEL WITH GFR
Anion gap: 8 (ref 5–15)
BUN: 22 mg/dL (ref 8–23)
CO2: 27 mmol/L (ref 22–32)
Calcium: 9 mg/dL (ref 8.9–10.3)
Chloride: 106 mmol/L (ref 98–111)
Creatinine, Ser: 1.43 mg/dL — ABNORMAL HIGH (ref 0.61–1.24)
GFR, Estimated: 49 mL/min — ABNORMAL LOW
Glucose, Bld: 156 mg/dL — ABNORMAL HIGH (ref 70–99)
Potassium: 3.9 mmol/L (ref 3.5–5.1)
Sodium: 141 mmol/L (ref 135–145)

## 2023-12-29 LAB — PROCALCITONIN: Procalcitonin: <0.1 ng/mL

## 2023-12-29 MED ORDER — INSULIN ASPART 100 UNIT/ML IJ SOLN
0.0000 [IU] | Freq: Three times a day (TID) | INTRAMUSCULAR | Status: DC
  Administered 2023-12-29: 3 [IU] via SUBCUTANEOUS
  Administered 2023-12-29: 1 [IU] via SUBCUTANEOUS
  Administered 2023-12-30: 2 [IU] via SUBCUTANEOUS
  Administered 2023-12-30: 3 [IU] via SUBCUTANEOUS
  Administered 2023-12-30: 1 [IU] via SUBCUTANEOUS
  Administered 2023-12-31 – 2024-01-01 (×4): 2 [IU] via SUBCUTANEOUS
  Administered 2024-01-01: 1 [IU] via SUBCUTANEOUS
  Administered 2024-01-01 – 2024-01-02 (×2): 2 [IU] via SUBCUTANEOUS
  Administered 2024-01-02: 3 [IU] via SUBCUTANEOUS
  Administered 2024-01-03: 2 [IU] via SUBCUTANEOUS
  Administered 2024-01-03: 3 [IU] via SUBCUTANEOUS
  Administered 2024-01-04: 1 [IU] via SUBCUTANEOUS
  Administered 2024-01-04: 2 [IU] via SUBCUTANEOUS
  Administered 2024-01-04: 5 [IU] via SUBCUTANEOUS
  Administered 2024-01-05: 2 [IU] via SUBCUTANEOUS
  Administered 2024-01-05: 3 [IU] via SUBCUTANEOUS
  Administered 2024-01-05: 1 [IU] via SUBCUTANEOUS
  Administered 2024-01-06: 9 [IU] via SUBCUTANEOUS
  Administered 2024-01-06: 2 [IU] via SUBCUTANEOUS
  Administered 2024-01-06 – 2024-01-07 (×2): 3 [IU] via SUBCUTANEOUS
  Administered 2024-01-07: 5 [IU] via SUBCUTANEOUS
  Administered 2024-01-07 – 2024-01-08 (×2): 3 [IU] via SUBCUTANEOUS
  Administered 2024-01-08: 2 [IU] via SUBCUTANEOUS
  Administered 2024-01-08: 3 [IU] via SUBCUTANEOUS
  Administered 2024-01-09: 2 [IU] via SUBCUTANEOUS
  Administered 2024-01-09: 7 [IU] via SUBCUTANEOUS
  Administered 2024-01-09: 2 [IU] via SUBCUTANEOUS
  Administered 2024-01-10: 3 [IU] via SUBCUTANEOUS
  Administered 2024-01-10: 1 [IU] via SUBCUTANEOUS
  Filled 2023-12-29: qty 3
  Filled 2023-12-29 (×2): qty 2
  Filled 2023-12-29: qty 1
  Filled 2023-12-29: qty 2
  Filled 2023-12-29: qty 3
  Filled 2023-12-29: qty 2
  Filled 2023-12-29: qty 3
  Filled 2023-12-29: qty 9
  Filled 2023-12-29: qty 3
  Filled 2023-12-29 (×2): qty 2
  Filled 2023-12-29: qty 1
  Filled 2023-12-29: qty 3
  Filled 2023-12-29 (×2): qty 1
  Filled 2023-12-29: qty 5
  Filled 2023-12-29 (×3): qty 2
  Filled 2023-12-29: qty 3

## 2023-12-29 MED ORDER — INSULIN GLARGINE-YFGN 100 UNIT/ML ~~LOC~~ SOLN: 15.0000 [IU] | Freq: Once | SUBCUTANEOUS | Status: DC

## 2023-12-29 MED ORDER — INSULIN ASPART 100 UNIT/ML IJ SOLN: 3.0000 [IU] | Freq: Three times a day (TID) | INTRAMUSCULAR | Status: DC

## 2023-12-29 MED ORDER — INSULIN GLARGINE 100 UNIT/ML ~~LOC~~ SOLN
15.0000 [IU] | Freq: Once | SUBCUTANEOUS | Status: AC
  Administered 2023-12-29: 15 [IU] via SUBCUTANEOUS
  Filled 2023-12-29: qty 0.15

## 2023-12-29 MED ORDER — INSULIN ASPART 100 UNIT/ML IJ SOLN
0.0000 [IU] | Freq: Every day | INTRAMUSCULAR | Status: DC
  Administered 2024-01-05: 2 [IU] via SUBCUTANEOUS
  Administered 2024-01-07: 3 [IU] via SUBCUTANEOUS
  Administered 2024-01-08: 2 [IU] via SUBCUTANEOUS
  Administered 2024-01-10: 1 [IU] via SUBCUTANEOUS
  Filled 2023-12-29: qty 2

## 2023-12-29 MED ORDER — LACTATED RINGERS IV SOLN: INTRAVENOUS | Status: DC

## 2023-12-29 MED ORDER — INSULIN ASPART 100 UNIT/ML IJ SOLN: 0.0000 [IU] | Freq: Three times a day (TID) | INTRAMUSCULAR | Status: DC

## 2023-12-29 MED ORDER — LOSARTAN POTASSIUM 25 MG PO TABS
25.0000 mg | ORAL_TABLET | Freq: Every day | ORAL | Status: DC
  Administered 2023-12-29 – 2024-01-10 (×13): 25 mg via ORAL
  Filled 2023-12-29 (×9): qty 1

## 2023-12-29 MED ORDER — HYDRALAZINE HCL 25 MG PO TABS: 25.0000 mg | ORAL_TABLET | Freq: Four times a day (QID) | ORAL | Status: DC | PRN

## 2023-12-29 MED ORDER — INSULIN ASPART 100 UNIT/ML IJ SOLN: 0.0000 [IU] | INTRAMUSCULAR | Status: DC

## 2023-12-29 MED ORDER — FLUTICASONE PROPIONATE 50 MCG/ACT NA SUSP: 2.0000 | Freq: Every day | NASAL | Status: DC | PRN

## 2023-12-29 MED ORDER — INSULIN GLARGINE-YFGN 100 UNIT/ML ~~LOC~~ SOLN
7.0000 [IU] | Freq: Two times a day (BID) | SUBCUTANEOUS | Status: DC
  Administered 2023-12-29 – 2023-12-30 (×3): 7 [IU] via SUBCUTANEOUS
  Filled 2023-12-29 (×3): qty 0.07

## 2023-12-29 MED ORDER — INSULIN ASPART 100 UNIT/ML IJ SOLN
3.0000 [IU] | Freq: Three times a day (TID) | INTRAMUSCULAR | Status: DC
  Administered 2023-12-29 – 2024-01-02 (×8): 3 [IU] via SUBCUTANEOUS
  Filled 2023-12-29 (×7): qty 3

## 2023-12-29 MED ORDER — VITAMIN D 25 MCG (1000 UNIT) PO TABS
1000.0000 [IU] | ORAL_TABLET | Freq: Every day | ORAL | Status: DC
  Administered 2023-12-29 – 2024-01-10 (×13): 1000 [IU] via ORAL
  Filled 2023-12-29 (×9): qty 1

## 2023-12-29 NOTE — Evaluation (Signed)
 Physical Therapy Evaluation Patient Details Name: Steve Brown MRN: [id number] DOB: 1940-11-27 Today's Date: 12/29/2023  History of Present Illness  Patient is a 83 y.o. male admitted 12/28/2023 with altered mental status and generalized weakness. Recent hospitalization last week for pneumonia and acute encephalopathy. PMH: dementia, A-fib on Eliquis ,/ BPH, T2DM, GERD, CVA, CAD s/p stenting, DDD, prostate cancer, CKD 3B, anxiety, depression.  Clinical Impression  Patient was in bed upon arrival and agreeable to PT session. Patient was oriented to place, but not time, and he took increased time to verbalized his birth year. Required assist with supine to sit transfer with scooting hips towards EOB. Patient verbalized 10/10 head and shoulder pain. PT monitored this throughout session and notified RN. Upon completing stand transfer patient verbalized increased dizziness. BP was taken and a was 124/79. Patient was not able to stand long enough to get a standing blood pressure reading. Patient did not want to seated EOB to complete exercise and requested to lay down. Patient able to lay with min A but required assist of 2 to readjust body in bed. Patient would benefit from continued skilled services to address below impairments.      If plan is discharge home, recommend the following: Help with stairs or ramp for entrance;Assistance with cooking/housework;A lot of help with walking and/or transfers;A lot of help with bathing/dressing/bathroom   Can travel by private vehicle   Yes    Equipment Recommendations None recommended by PT  Recommendations for Other Services       Functional Status Assessment Patient has had a recent decline in their functional status and demonstrates the ability to make significant improvements in function in a reasonable and predictable amount of time.     Precautions / Restrictions Precautions Precautions: Fall Restrictions Weight Bearing Restrictions Per  Provider Order: No      Mobility  Bed Mobility Overal bed mobility: Needs Assistance Bed Mobility: Supine to Sit, Sit to Supine     Supine to sit: Used rails, HOB elevated, Min assist, Mod assist Sit to supine: Min assist, Mod assist, +2 for safety/equipment   General bed mobility comments: Able to initiate supine to sit transfer. Needed PT assist to scoot hips closer to EOB using draw sheet (mod A). Able to perform sit to supien transfer but needed assit for postural adjustment. Assist of 2 to readjust patient in bed using drawer sheet. Patient able to follow commands and verbal cueing.    Transfers Overall transfer level: Needs assistance Equipment used: Rolling walker (2 wheels) Transfers: Sit to/from Stand Sit to Stand: Mod assist           General transfer comment: Patient required mod A with sit to stand transfer x2. Poor anterior trunk lean for momentum. Verbalized increase in head pain upon standing so sat EOB again. Patient was unable to stand long enought for PT to check orthostatics.    Ambulation/Gait               General Gait Details: Patient unable to walk due to dizziness.  Stairs            Wheelchair Mobility     Tilt Bed    Modified Rankin (Stroke Patients Only)       Balance Overall balance assessment: Needs assistance Sitting-balance support: Feet unsupported, Bilateral upper extremity supported Sitting balance-Leahy Scale: Fair Sitting balance - Comments: Slight posterior LOB when feet were not supported and removed one UE support. Patient able to self correct balance   Standing  balance support: Bilateral upper extremity supported, Reliant on assistive device for balance Standing balance-Leahy Scale: Fair Standing balance comment: Flexed posture                             Pertinent Vitals/Pain Pain Assessment Pain Assessment: 0-10 Pain Score: 10-Worst pain ever Pain Location: head and left shoulder Pain  Intervention(s): Limited activity within patient's tolerance, Patient requesting pain meds-RN notified, Monitored during session, Repositioned    Home Living Family/patient expects to be discharged to:: Private residence Living Arrangements: Alone Available Help at Discharge: Friend(s);Available PRN/intermittently Type of Home: Apartment Home Access: Stairs to enter Entrance Stairs-Rails: Right (Patient unable to say, From previous admittion railing is on the right) Entrance Stairs-Number of Steps: 16   Home Layout: One level Home Equipment: Agricultural Consultant (2 wheels);Cane - single point;Shower seat      Prior Function Prior Level of Function : Independent/Modified Independent             Mobility Comments: Patient reports he use what ever assistance device he can get his hands on       Extremity/Trunk Assessment        Lower Extremity Assessment Lower Extremity Assessment: Overall WFL for tasks assessed       Communication   Communication Communication: No apparent difficulties    Cognition Arousal: Alert Behavior During Therapy: WFL for tasks assessed/performed   PT - Cognitive impairments: No family/caregiver present to determine baseline, Memory   Orientation impairments: Time                   PT - Cognition Comments: Oriented to place, able to say DOB but took an extended period of time to remember year; unable to recall current year Following commands: Intact       Cueing Cueing Techniques: Verbal cues     General Comments      Exercises     Assessment/Plan    PT Assessment Patient needs continued PT services  PT Problem List Decreased strength;Decreased activity tolerance;Decreased balance;Decreased mobility       PT Treatment Interventions DME instruction;Therapeutic exercise;Gait training;Balance training;Stair training;Functional mobility training;Therapeutic activities;Patient/family education    PT Goals (Current goals can be  found in the Care Plan section)  Acute Rehab PT Goals Patient Stated Goal: to get stronger PT Goal Formulation: With patient Time For Goal Achievement: 01/11/24 Potential to Achieve Goals: Good    Frequency Min 2X/week     Co-evaluation               AM-PAC PT 6 Clicks Mobility  Outcome Measure Help needed turning from your back to your side while in a flat bed without using bedrails?: A Lot Help needed moving from lying on your back to sitting on the side of a flat bed without using bedrails?: A Lot Help needed moving to and from a bed to a chair (including a wheelchair)?: A Lot Help needed standing up from a chair using your arms (e.g., wheelchair or bedside chair)?: A Lot Help needed to walk in hospital room?: A Lot Help needed climbing 3-5 steps with a railing? : A Lot 6 Click Score: 12    End of Session Equipment Utilized During Treatment: Gait belt Activity Tolerance: Patient tolerated treatment well;Patient limited by pain (limited by head pain and dizziness) Patient left: with call bell/phone within reach;with bed alarm set;in bed Nurse Communication: Mobility status;Patient requests pain meds;Precautions (dizziness upon standing) PT  Visit Diagnosis: Muscle weakness (generalized) (M62.81);Unsteadiness on feet (R26.81);Other abnormalities of gait and mobility (R26.89)    Time: 0925-1000 PT Time Calculation (min) (ACUTE ONLY): 35 min   Charges:   PT Evaluation $PT Eval Moderate Complexity: 1 Mod PT Treatments $Therapeutic Activity: 8-22 mins PT General Charges $$ ACUTE PT VISIT: 1 Visit         Kristeen Sar, PT, DPT 12/29/2023 11:32 AM   Kristeen Sar 12/29/2023, 11:24 AM

## 2023-12-29 NOTE — Progress Notes (Signed)
" @   1636 Noted evening meal time blood sugar of 65, asymptomatic.  Hospital protocol activated, oral intake encouraged.  RN to monitor closely  @ 1654 recheck of blood sugar post treatment, noted 71.  Oral intake encouraged.  RN to continue to monitor closely.  Hospital protocol remain implemented.  Safety measures remain in place, call bell in reach. "

## 2023-12-29 NOTE — Evaluation (Signed)
 Occupational Therapy Evaluation Patient Details Name: Steve Brown MRN: [id number] DOB: January 16, 1940 Today's Date: 12/29/2023   History of Present Illness   Patient is a 83 y.o. male admitted 12/28/2023 with altered mental status and generalized weakness. Recent hospitalization last week for pneumonia and acute encephalopathy. PMH: dementia, A-fib on Eliquis ,/ BPH, T2DM, GERD, CVA, CAD s/p stenting, DDD, prostate cancer, CKD 3B, anxiety, depression.     Clinical Impressions Pt presents with decline in function and safety with ADLs and ADL mobility with impaired strength, balance, endurance and cognition. PTA pt lives alone and reports that he was Ind with ADLs, drives to appts, cooks, uses a cane for mobility, has RW ans shower chair. Pt currently requires mod A to sit EOB with increased time and cues for initiation LEs to toward EOB, mod A STS/mobility using RW, min A with UB ADLs, max A with LB ADLs, max A with toileting and required repeated multimodal cue throughout for initiation and task continuation. OT will follow acutely to maximize level of function and safety      If plan is discharge home, recommend the following:   A lot of help with bathing/dressing/bathroom;A lot of help with walking and/or transfers;Assistance with cooking/housework;Assist for transportation;Direct supervision/assist for financial management;Help with stairs or ramp for entrance;Direct supervision/assist for medications management     Functional Status Assessment   Patient has had a recent decline in their functional status and demonstrates the ability to make significant improvements in function in a reasonable and predictable amount of time.     Equipment Recommendations   Other (comment) (defer)     Recommendations for Other Services         Precautions/Restrictions   Precautions Precautions: Fall Recall of Precautions/Restrictions: Intact Restrictions Weight Bearing Restrictions  Per Provider Order: No     Mobility Bed Mobility Overal bed mobility: Needs Assistance Bed Mobility: Supine to Sit, Sit to Supine     Supine to sit: Used rails, HOB elevated, Min assist, Mod assist     General bed mobility comments: increased time to initiate LEs to EOB and was unable to initiate scooting hips forward to EOB for B feet to arrive to floor as pt seemed to have difficuly processing repeated instructions, OT used padding underneath pt to scoot    Transfers Overall transfer level: Needs assistance Equipment used: Rolling walker (2 wheels) Transfers: Sit to/from Stand Sit to Stand: Mod assist     Step pivot transfers: Mod assist     General transfer comment: pt reports no dizziness      Balance Overall balance assessment: Needs assistance Sitting-balance support: Feet unsupported, Bilateral upper extremity supported Sitting balance-Leahy Scale: Fair     Standing balance support: Bilateral upper extremity supported, Reliant on assistive device for balance Standing balance-Leahy Scale: Poor                             ADL either performed or assessed with clinical judgement   ADL Overall ADL's : Needs assistance/impaired Eating/Feeding: Independent   Grooming: Wash/dry hands;Wash/dry face;Minimal assistance;Standing   Upper Body Bathing: Minimal assistance;Sitting   Lower Body Bathing: Maximal assistance   Upper Body Dressing : Minimal assistance;Sitting   Lower Body Dressing: Maximal assistance   Toilet Transfer: Moderate assistance;Rolling walker (2 wheels);Stand-pivot;Cueing for safety;Cueing for sequencing   Toileting- Clothing Manipulation and Hygiene: Maximal assistance;Sit to/from stand       Functional mobility during ADLs: Moderate assistance;Rolling walker (2 wheels);Cueing for  sequencing;Cueing for safety General ADL Comments: increased time with repeated instructions to initiate tasks, if at all able to complete      Vision Baseline Vision/History: 1 Wears glasses Ability to See in Adequate Light: 0 Adequate Patient Visual Report: No change from baseline       Perception         Praxis         Pertinent Vitals/Pain Pain Assessment Pain Assessment: Faces Faces Pain Scale: Hurts even more Pain Location: head and left shoulder Pain Descriptors / Indicators: Aching, Sore, Discomfort Pain Intervention(s): Limited activity within patient's tolerance, Premedicated before session, Repositioned     Extremity/Trunk Assessment Upper Extremity Assessment Upper Extremity Assessment: Generalized weakness;Right hand dominant   Lower Extremity Assessment Lower Extremity Assessment: Defer to PT evaluation       Communication Communication Communication: No apparent difficulties   Cognition Arousal: Alert Behavior During Therapy: WFL for tasks assessed/performed, Flat affect Cognition: Cognition impaired   Orientation impairments: Time, Situation   Memory impairment (select all impairments): Short-term memory                       Following commands: Impaired Following commands impaired: Follows multi-step commands inconsistently, Follows multi-step commands with increased time     Cueing  General Comments   Cueing Techniques: Verbal cues      Exercises     Shoulder Instructions      Home Living Family/patient expects to be discharged to:: Private residence Living Arrangements: Alone Available Help at Discharge: Friend(s);Available PRN/intermittently Type of Home: Apartment Home Access: Stairs to enter Entrance Stairs-Number of Steps: 16 Entrance Stairs-Rails: Right Home Layout: One level     Bathroom Shower/Tub: Chief Strategy Officer: Standard Bathroom Accessibility: Yes   Home Equipment: Agricultural Consultant (2 wheels);Cane - single point;Shower seat          Prior Functioning/Environment Prior Level of Function : Independent/Modified  Independent             Mobility Comments: reports he uses whatever assistance device he can get his hands on ADLs Comments: Ind with ADLs, reports that he manages his own meds, pays bills, drives to Dr appts    OT Problem List: Impaired balance (sitting and/or standing);Decreased strength;Decreased knowledge of use of DME or AE;Decreased cognition;Decreased activity tolerance;Decreased safety awareness;Pain   OT Treatment/Interventions: Self-care/ADL training;Therapeutic exercise;Patient/family education;Balance training;Therapeutic activities;DME and/or AE instruction      OT Goals(Current goals can be found in the care plan section)   Acute Rehab OT Goals Patient Stated Goal: none stated OT Goal Formulation: With patient Time For Goal Achievement: 01/12/24 Potential to Achieve Goals: Good ADL Goals Pt Will Perform Grooming: with contact guard assist;standing Pt Will Perform Upper Body Bathing: with contact guard assist;with supervision;sitting Pt Will Perform Lower Body Bathing: with mod assist;with min assist;sitting/lateral leans;sit to/from stand Pt Will Perform Upper Body Dressing: with contact guard assist;with supervision;sitting Pt Will Transfer to Toilet: with min assist;with contact guard assist;ambulating Pt Will Perform Toileting - Clothing Manipulation and hygiene: with mod assist;with min assist;sitting/lateral leans;sit to/from stand   OT Frequency:  Min 2X/week    Co-evaluation              AM-PAC OT 6 Clicks Daily Activity     Outcome Measure Help from another person eating meals?: None Help from another person taking care of personal grooming?: A Little Help from another person toileting, which includes using toliet, bedpan, or urinal?: A Lot  Help from another person bathing (including washing, rinsing, drying)?: A Lot Help from another person to put on and taking off regular upper body clothing?: A Little Help from another person to put on and  taking off regular lower body clothing?: A Lot 6 Click Score: 16   End of Session Equipment Utilized During Treatment: Gait belt;Rolling walker (2 wheels) Nurse Communication: Mobility status  Activity Tolerance: Patient tolerated treatment well Patient left: in chair;with call bell/phone within reach;with chair alarm set  OT Visit Diagnosis: Unsteadiness on feet (R26.81);Muscle weakness (generalized) (M62.81);Pain;Other abnormalities of gait and mobility (R26.89) Pain - Right/Left: Left Pain - part of body: Shoulder (and HA)                Time: 8946-8878 OT Time Calculation (min): 28 min Charges:  OT General Charges $OT Visit: 1 Visit OT Evaluation $OT Eval Moderate Complexity: 1 Mod OT Treatments $Therapeutic Activity: 8-22 mins   Jacques Karna Loose 12/29/2023, 12:50 PM

## 2023-12-29 NOTE — Plan of Care (Signed)
   Problem: Education: Goal: Knowledge of General Education information will improve Description: Including pain rating scale, medication(s)/side effects and non-pharmacologic comfort measures Outcome: Progressing   Problem: Clinical Measurements: Goal: Will remain free from infection Outcome: Progressing   Problem: Activity: Goal: Risk for activity intolerance will decrease Outcome: Progressing

## 2023-12-29 NOTE — Progress Notes (Signed)
 [facility] PROGRESS NOTE    Progress Note  Steve Brown  FMW:[id number] DOB: 12-16-1940 DOA: 12/28/2023 PCP: [facility]     Brief Narrative:  Steve Brown is an 83 y.o. male past medical history significant for dementia, chronic atrial fibrillation on Eliquis , BPH, diabetes mellitus type 2, CVA CAD status post stenting prostate cancer, chronic kidney disease stage IIIb recently discharged from the hospital for pneumonia and encephalopathy presents via EMS for confusion and generalized weakness.  Daughter relates he has not been taking his medication over the last several days.  Blood glucose in the ED was 400 BUN and creatinine were elevated lactic acid was unremarkable.  Assessment/Plan:   Hyperglycemic hyperosmolar nonketotic state: He was started on IV insulin  and IV fluids and her blood glucose improved. Will transition to long-acting insulin  and sliding scale.  Hemoglobin A1c was 10. Consult PT OT, anticipate might need skilled nursing facility placement.  Acute metabolic encephalopathy: Likely due to hyperglycemia he was started on IV fluids now improved. He is alert to person and place.  Pneumonia has been ruled out: Discontinue antibiotics, has remained afebrile with no leukocytosis.  Essential hypertension: Resume losartan . Hydralazine  as needed.  Chronic kidney disease stage IIIb: Creatinine appears to be at baseline.  Paroxysmal atrial fibrillation: Rate control continue Eliquis .  Chronic left shoulder pain, Continue lidocaine  patch.  Dementia: Continue benazepril.  BPH: Continue finasteride  and Flomax .  Hyperlipidemia:  Continue statins.  Anxiety and depression: Continue buspirone , Lexapro  and Remeron .  Generalized weakness: Consult PT OT anticipate will need skilled nursing facility placement.  Social determinants of health/failure to thrive: Daughter is concerned about patient being able to care for himself. Will get PT  OT consult.  DVT prophylaxis: Eliquis  Family Communication:Daughter Status is: Inpatient Remains inpatient appropriate because: Hyperglycemic hyperosmolar non ketotic state.    Code Status:     Code Status Orders  (From admission, onward)           Start     Ordered   12/28/23 2045  Full code  Continuous       Question:  By:  Answer:  Consent: discussion documented in EHR   12/28/23 2045           Code Status History     Date Active Date Inactive Code Status Order ID Comments User Context   12/17/2023 2159 12/21/2023 1708 Full Code [id number]  Dena Charleston, MD ED   11/21/2023 2019 11/23/2023 2326 Full Code [id number]  Tobie Mario GAILS, MD ED   09/28/2023 2130 09/30/2023 2206 Full Code [id number]  Lou Claretta HERO, MD Inpatient   08/06/2022 1440 08/08/2022 2033 Full Code [id number]  Kathrin Mignon DASEN, MD ED   05/26/2020 0625 06/01/2020 0034 Full Code [id number]  Alfornia Madison, MD ED   08/12/2019 0209 08/17/2019 1646 Full Code [id number]  Silvester Ales, MD ED   05/25/2017 1320 05/25/2017 2004 Full Code [id number]  Dann Candyce RAMAN, MD Inpatient   05/18/2016 1717 05/19/2016 2204 Full Code [id number]  Vicci Afton CROME, MD Inpatient   04/26/2016 0529 04/27/2016 2001 Full Code [id number]  Charlton Evalene RAMAN, MD Inpatient   11/02/2015 1335 11/03/2015 1747 Full Code [id number]  Belinda Cough, MD Inpatient   11/03/2014 2158 11/05/2014 1638 Full Code [id number]  Starleen Meter, MD Inpatient   07/24/2013 0305 07/24/2013 2242 Full Code [id number]  Mavis Ritchie BROCKS, MD Inpatient   04/03/2013 1655 04/03/2013 2233 Full Code [id number]  Dann Candyce RAMAN, MD Inpatient   03/20/2013 1934 03/21/2013 1818 Full Code [id number]  Maryjo Dorise LELON DEVONNA Inpatient   10/08/2012 2246 10/09/2012 1935 Full Code [id number]  Lonzell Emeline HERO, DO Inpatient   07/01/2012 0235 07/02/2012 1402 Full Code [id number]  Franky Redia SAILOR, MD Inpatient   07/18/2011 2309 07/20/2011 1731 Full Code [id number]  Vergel de Page Nelva HERO, RN  Inpatient      Advance Directive Documentation    Flowsheet Row Most Recent Value  Type of Advance Directive Living will  Pre-existing out of facility DNR order (yellow form or pink MOST form) --  MOST Form in Place? --      IV Access:   Peripheral IV   Procedures and diagnostic studies:   DG Chest 2 View Result Date: 12/28/2023 CLINICAL DATA:  Altered.  Lethargy.  Recent pneumonia diagnosis. EXAM: CHEST - 2 VIEW COMPARISON:  Chest radiograph 12/17/2023 FINDINGS: Improved left lung base aeration with decreasing opacity in obscuration of the hemidiaphragm. No new airspace disease. Stable heart size and mediastinal contours. Aortic atherosclerosis. No pulmonary edema. No pneumothorax. Stable osseous structures. IMPRESSION: Improved left lung base aeration with decreasing opacity. No new airspace disease. Electronically Signed   By: Andrea Gasman M.D.   On: 12/28/2023 17:51   CT Head Wo Contrast Result Date: 12/28/2023 CLINICAL DATA:  Lethargy. EXAM: CT HEAD WITHOUT CONTRAST TECHNIQUE: Contiguous axial images were obtained from the base of the skull through the vertex without intravenous contrast. RADIATION DOSE REDUCTION: This exam was performed according to the departmental dose-optimization program which includes automated exposure control, adjustment of the mA and/or kV according to patient size and/or use of iterative reconstruction technique. COMPARISON:  December 17, 2023 FINDINGS: Brain: There is generalized cerebral atrophy with widening of the extra-axial spaces and ventricular dilatation. There are areas of decreased attenuation within the white matter tracts of the supratentorial brain, consistent with microvascular disease changes. A small chronic left cerebellar infarct is seen. Vascular: Marked severity bilateral cavernous carotid artery calcification is noted. Skull: Normal. Negative for fracture or focal lesion. Sinuses/Orbits: There is marked severity sphenoid sinus and  left ethmoid sinus mucosal thickening. Other: A stable left frontal scalp lipoma is seen. IMPRESSION: 1. Generalized cerebral atrophy with chronic white matter small vessel ischemic changes. 2. Small chronic left cerebellar infarct. 3. Marked severity sphenoid sinus and left ethmoid sinus disease. Electronically Signed   By: Suzen Dials M.D.   On: 12/28/2023 16:49     Medical Consultants:   None.   Subjective:    Steve Brown no complains  Objective:    Vitals:   12/29/23 0200 12/29/23 0400 12/29/23 0416 12/29/23 0553  BP: (!) 143/83 (!) 146/88  (!) 154/91  Pulse: 68 70  64  Resp: 20 15  18   Temp:   97.9 F (36.6 C) 97.8 F (36.6 C)  TempSrc:   Oral Oral  SpO2: 98% 100%    Weight:      Height:       SpO2: 100 %   Intake/Output Summary (Last 24 hours) at 12/29/2023 0720 Last data filed at 12/29/2023 0600 Gross per 24 hour  Intake 839.61 ml  Output --  Net 839.61 ml   Filed Weights   12/28/23 1513  Weight: 78.2 kg    Exam: General exam: In no acute distress. Respiratory system: Good air movement and clear to auscultation. Cardiovascular system: S1 & S2 heard, RRR. No JVD. Gastrointestinal system: Abdomen is nondistended, soft and nontender.  Central nervous system: Alert and oriented x2 . No focal neurological deficits. Extremities: No pedal edema. Skin:  No rashes, lesions or ulcers Psychiatry: No Judgement or insight.   Data Reviewed:    Labs: Basic Metabolic Panel: Recent Labs  Lab 12/28/23 1637 12/29/23 0229  NA 139 141  K 4.5 3.9  CL 98 106  CO2 24 27  GLUCOSE 417* 156*  BUN 28* 22  CREATININE 1.65* 1.43*  CALCIUM  10.0 9.0   GFR Estimated Creatinine Clearance: 39.1 mL/min (A) (by C-G formula based on SCr of 1.43 mg/dL (H)). Liver Function Tests: Recent Labs  Lab 12/28/23 1637  AST 45*  ALT 22  ALKPHOS 80  BILITOT 0.8  PROT 7.4  ALBUMIN  3.9   No results for input(s): LIPASE, AMYLASE in the last 168 hours. Recent  Labs  Lab 12/28/23 1637  AMMONIA 17   Coagulation profile No results for input(s): INR, PROTIME in the last 168 hours. COVID-19 Labs  No results for input(s): DDIMER, FERRITIN, LDH, CRP in the last 72 hours.  Lab Results  Component Value Date   SARSCOV2NAA NEGATIVE 12/17/2023   SARSCOV2NAA NEGATIVE 09/28/2023   SARSCOV2NAA NEGATIVE 02/05/2023   SARSCOV2NAA NEGATIVE 12/05/2021    CBC: Recent Labs  Lab 12/28/23 1637 12/29/23 0229  WBC 5.2 5.3  NEUTROABS 3.2  --   HGB 13.6 12.0*  HCT 38.9* 34.9*  MCV 91.3 92.1  PLT 246 197   Cardiac Enzymes: No results for input(s): CKTOTAL, CKMB, CKMBINDEX, TROPONINI in the last 168 hours. BNP (last 3 results) No results for input(s): PROBNP in the last 8760 hours. CBG: Recent Labs  Lab 12/28/23 2244 12/28/23 2342 12/29/23 0058 12/29/23 0207 12/29/23 0414  GLUCAP 198* 166* 152* 126* 132*   D-Dimer: No results for input(s): DDIMER in the last 72 hours. Hgb A1c: Recent Labs    12/29/23 0229  HGBA1C 9.8*   Lipid Profile: No results for input(s): CHOL, HDL, LDLCALC, TRIG, CHOLHDL, LDLDIRECT in the last 72 hours. Thyroid  function studies: No results for input(s): TSH, T4TOTAL, T3FREE, THYROIDAB in the last 72 hours.  Invalid input(s): FREET3 Anemia work up: No results for input(s): VITAMINB12, FOLATE, FERRITIN, TIBC, IRON, RETICCTPCT in the last 72 hours. Sepsis Labs: Recent Labs  Lab 12/28/23 1637 12/28/23 1643 12/28/23 1928 12/29/23 0229  PROCALCITON  --   --   --  <0.10  WBC 5.2  --   --  5.3  LATICACIDVEN  --  2.1* 1.6  --    Microbiology No results found for this or any previous visit (from the past 240 hours).   Medications:    apixaban   5 mg Oral BID   azithromycin   500 mg Oral Daily   buPROPion   150 mg Oral Daily   donepezil   10 mg Oral Daily   escitalopram   20 mg Oral q AM   finasteride   5 mg Oral Daily   insulin  aspart  0-6 Units  Subcutaneous Q4H   lidocaine   1 patch Transdermal Q24H   pantoprazole   40 mg Oral Daily   rosuvastatin   40 mg Oral Daily   tamsulosin   0.4 mg Oral QPC supper   valACYclovir   500 mg Oral Daily   Continuous Infusions:  cefTRIAXone  (ROCEPHIN )  IV     lactated ringers  75 mL/hr at 12/29/23 0516      LOS: 1 day   Steve Brown  [facility]  12/29/2023, 7:20 AM

## 2023-12-30 DIAGNOSIS — R531 Weakness: Secondary | ICD-10-CM | POA: Diagnosis not present

## 2023-12-30 LAB — GLUCOSE, CAPILLARY
Glucose-Capillary: 147 mg/dL — ABNORMAL HIGH (ref 70–99)
Glucose-Capillary: 212 mg/dL — ABNORMAL HIGH (ref 70–99)
Glucose-Capillary: 162 mg/dL — ABNORMAL HIGH (ref 70–99)
Glucose-Capillary: 80 mg/dL (ref 70–99)
Glucose-Capillary: 75 mg/dL (ref 70–99)

## 2023-12-30 MED ORDER — INSULIN GLARGINE-YFGN 100 UNIT/ML ~~LOC~~ SOLN
7.0000 [IU] | Freq: Every day | SUBCUTANEOUS | Status: DC
  Administered 2023-12-31 – 2024-01-06 (×6): 7 [IU] via SUBCUTANEOUS
  Filled 2023-12-30 (×7): qty 0.07

## 2023-12-30 NOTE — Hospital Course (Addendum)
 Steve Brown is a 83 y.o. male with medical history significant for dementia, A-fib on Eliquis , BPH, T2DM, GERD, CVA, CAD s/p stenting, DDD, prostate cancer, CKD 3B, anxiety, depression and a recent hospitalization for pneumonia and acute encephalopathy who presented to the ED via EMS for evaluation of altered mental status and generalized weakness.  According to EMS, patient lives alone in an apartment complex. The maintenance noticed patient was weak and altered so he called EMS.  According to daughter, patient has a history of not eating or taking his meds when he is in pain.  Reports that patient has not taken his meds or eating in the last 3 days. Patient reports worsening in his left shoulder pain and a headache but denies any trauma or injury to the shoulder.  He endorses feeling weak but denies any fevers, chills, shortness of breath, chest pain, cough, abdominal pain, nausea, vomiting or dysuria.   He is able to tell his full name, DOB, identify daughter, current location, current president but not the month or the year.   ED Course: Initial vitals show patient afebrile, but hypertensive with SBP in the 140s to 160s. Initial labs significant for glucose 417, BUN/creatinine 28/1.65, lactic acid 2.1-12.6, normal CBC, ethanol level, ammonia and UDS. CT head with no acute intracranial abnormality. CXR shows improved left lung base region with decreasing opacity. Pt received Tylenol , IV Rocephin , azithromycin , IVF bolus and started on insulin  drip. [facility] was consulted for admission.    Assessment/Plan:   Generalized weakness and deconditioning - In the setting of acute illness but also contribution from dementia - PT/OT evals; likely in need of SNF and/or LTC - Fall precautions   Social determinants of health Failure to thrive - Daughter is concern about patient's ability to care for self over the last few weeks - Family are looking for available resources for patient ([facility] patient) to assist  with ADLs and medication - TOC consulted for assistance  Acute metabolic encephalopathy - resolved  - Patient with dementia who lives alone now presenting with altered mental status for the second time in less than 2 weeks - Patient quite weak appearing with slowed movement, oriented to self, person and place but not to time - Likely combination of severe hyperglycemia, dehydration, recent hospitalization, and some progression of his dementia - Delirium precautions  Dementia - Continued donepezil    Severe hyperglycemia Uncontrolled type 2 diabetes - Patient found to have glucose of 417 on admission AG of 16 but no acidosis - Findings due to patient not taking his insulin  over the last few days - S/p fluids -Continue Semglee  and SSI.  Modifying as needed - A1c 9.8 - Some hypoglycemia noted yesterday evening.  Will hold prandial coverage for now   CAP - resolved  - X-ray on admission showed improved opacity -Completed antibiotics after last hospitalization -Do not feel he needs ongoing antibiotics given treatment completed -Afebrile and no leukocytosis - Discontinue antibiotics  HTN - BP elevated with SBP in the 140s to 160s - Continue losartan    CKD 3B - Creatinine of 1.65 around recent baseline of 1.5-1.9 - Trend renal function and avoid nephrotoxic agents   Atrial fibrillation - Rate controlled - Continue Eliquis    Chronic left shoulder pain - Apply lidocaine  patch and continue as needed Norco   BPH - Continue finasteride  and Flomax    HLD - Continue rosuvastatin    Anxiety and depression - Continue bupropion , Lexapro  and Remeron 

## 2023-12-30 NOTE — Progress Notes (Signed)
 " Progress Note    Steve Brown   FMW:[id number]  DOB: 12-25-1940  DOA: 12/28/2023     2 PCP: [facility]  Initial CC: Fall, weakness, confusion  Hospital Course: Izik Bingman is a 83 y.o. male with medical history significant for dementia, A-fib on Eliquis , BPH, T2DM, GERD, CVA, CAD s/p stenting, DDD, prostate cancer, CKD 3B, anxiety, depression and a recent hospitalization for pneumonia and acute encephalopathy who presented to the ED via EMS for evaluation of altered mental status and generalized weakness.  According to EMS, patient lives alone in an apartment complex. The maintenance noticed patient was weak and altered so he called EMS.  According to daughter, patient has a history of not eating or taking his meds when he is in pain.  Reports that patient has not taken his meds or eating in the last 3 days. Patient reports worsening in his left shoulder pain and a headache but denies any trauma or injury to the shoulder.  He endorses feeling weak but denies any fevers, chills, shortness of breath, chest pain, cough, abdominal pain, nausea, vomiting or dysuria.   He is able to tell his full name, DOB, identify daughter, current location, current president but not the month or the year.   ED Course: Initial vitals show patient afebrile, but hypertensive with SBP in the 140s to 160s. Initial labs significant for glucose 417, BUN/creatinine 28/1.65, lactic acid 2.1-12.6, normal CBC, ethanol level, ammonia and UDS. CT head with no acute intracranial abnormality. CXR shows improved left lung base region with decreasing opacity. Pt received Tylenol , IV Rocephin , azithromycin , IVF bolus and started on insulin  drip. TRH was consulted for admission.    Assessment/Plan   Generalized weakness and deconditioning - In the setting of acute illness but also contribution from dementia - PT/OT evals; likely in need of SNF and/or LTC - Fall precautions   Social determinants of  health Failure to thrive - Daughter is concern about patient's ability to care for self over the last few weeks - Family are looking for available resources for patient ([facility]) to assist with ADLs and medication - TOC consulted for assistance  Acute metabolic encephalopathy  - Patient with dementia who lives alone now presenting with altered mental status for the second time in less than 2 weeks - Patient quite weak appearing with slowed movement, oriented to self, person and place but not to time - Likely combination of severe hyperglycemia, dehydration, recent hospitalization, and some progression of his dementia - Delirium precautions   Severe hyperglycemia Uncontrolled type 2 diabetes - Patient found to have glucose of 417 on admission AG of 16 but no acidosis - Findings due to patient not taking his insulin  over the last few days - Altered mental status on exam but hemodynamically stable - S/p fluids -Continue Semglee  and SSI.  Modifying as needed - A1c 9.8%   CAP - resolved  - X-ray on admission showed improved opacity -Completed antibiotics after last hospitalization -Do not feel he needs ongoing antibiotics given treatment completed -Afebrile and no leukocytosis - Discontinue antibiotics  HTN - BP elevated with SBP in the 140s to 160s - Continue losartan    CKD 3B - Creatinine of 1.65 around recent baseline of 1.5-1.9 - Trend renal function and avoid nephrotoxic agents   Atrial fibrillation - Rate controlled - Continue Eliquis    Chronic left shoulder pain - Apply lidocaine  patch and continue as needed Norco   Dementia - Continued donepezil    BPH - Continue  finasteride  and Flomax    HLD - Continue rosuvastatin    Anxiety and depression - Continue bupropion , Lexapro  and Remeron   Interval History:  No events overnight.  Resting comfortably in bed.  Continues to have slowed mentation and generalized weakness.    Antimicrobials:   DVT prophylaxis:    apixaban  (ELIQUIS ) tablet 5 mg   Code Status:   Code Status: Full Code  Mobility Assessment (Last 72 Hours)     Mobility Assessment     Row Name 12/30/23 0803 12/29/23 2214 12/29/23 1253 12/29/23 1247 12/29/23 1116   Does the patient have exclusion criteria? No- Perform mobility assessment No- Perform mobility assessment Yes- Hold (Level 0) - Assessment complete -- --   What is the highest level of mobility based on the mobility assessment? Level 3 (Stands with assistance) - Balance while standing  and cannot march in place Level 3 (Stands with assistance) - Balance while standing  and cannot march in place -- Level 3 (Stands with assistance) - Balance while standing  and cannot march in place Level 3 (Stands with assistance) - Balance while standing  and cannot march in place   Is the above level different from baseline mobility prior to current illness? -- Yes - Recommend PT order -- -- --    Row Name 12/29/23 0600           Does the patient have exclusion criteria? No- Perform mobility assessment       What is the highest level of mobility based on the mobility assessment? Level 3 (Stands with assistance) - Balance while standing  and cannot march in place       Is the above level different from baseline mobility prior to current illness? Yes - Recommend PT order          Diet: Diet Orders (From admission, onward)     Start     Ordered   12/29/23 0128  Diet Carb Modified Room service appropriate? Yes  Diet effective now       Question Answer Comment  Diet-HS Snack? Nothing   Calorie Level Medium 1600-2000   Fluid consistency: Thin   Room service appropriate? Yes      12/29/23 0131            Barriers to discharge: None Disposition Plan: Potentially SNF HH orders placed:  Status is: Inpatient  Objective: Blood pressure (!) 159/93, pulse 65, temperature 98.2 F (36.8 C), temperature source Oral, resp. rate 18, height 5' 5 (1.651 m), weight 78.2 kg, SpO2 100%.   Examination:  Physical Exam Constitutional:      General: He is not in acute distress.    Comments: Slowed mentation and generalized confusion.  No distress  HENT:     Head: Normocephalic and atraumatic.     Mouth/Throat:     Mouth: Mucous membranes are moist.  Eyes:     Extraocular Movements: Extraocular movements intact.  Cardiovascular:     Rate and Rhythm: Normal rate and regular rhythm.  Pulmonary:     Effort: Pulmonary effort is normal. No respiratory distress.     Breath sounds: Normal breath sounds. No wheezing.  Abdominal:     General: Bowel sounds are normal. There is no distension.     Palpations: Abdomen is soft.     Tenderness: There is no abdominal tenderness.  Musculoskeletal:        General: Normal range of motion.     Cervical back: Normal range of motion and neck supple.  Skin:    General: Skin is warm and dry.  Neurological:     Mental Status: He is disoriented.     Motor: Weakness (Generalized.  Nonfocal) present.  Psychiatric:        Mood and Affect: Mood normal.        Behavior: Behavior normal.      Consultants:    Procedures:    Data Reviewed: Results for orders placed or performed during the hospital encounter of 12/28/23 (from the past 24 hours)  Glucose, capillary     Status: Abnormal   Collection Time: 12/29/23 11:52 AM  Result Value Ref Range   Glucose-Capillary 149 (H) 70 - 99 mg/dL  Glucose, capillary     Status: Abnormal   Collection Time: 12/29/23  4:36 PM  Result Value Ref Range   Glucose-Capillary 65 (L) 70 - 99 mg/dL  Glucose, capillary     Status: None   Collection Time: 12/29/23  4:54 PM  Result Value Ref Range   Glucose-Capillary 71 70 - 99 mg/dL  Glucose, capillary     Status: Abnormal   Collection Time: 12/29/23  8:52 PM  Result Value Ref Range   Glucose-Capillary 189 (H) 70 - 99 mg/dL  Glucose, capillary     Status: Abnormal   Collection Time: 12/30/23  8:25 AM  Result Value Ref Range   Glucose-Capillary 147 (H)  70 - 99 mg/dL    I have reviewed pertinent nursing notes, vitals, labs, and images as necessary. I have ordered labwork to follow up on as indicated.  I have reviewed the last notes from staff over past 24 hours. I have discussed patient's care plan and test results with nursing staff, CM/SW, and other staff as appropriate.  Old records reviewed in assessment of this patient  Time spent: Greater than 50% of the 55 minute visit was spent in counseling/coordination of care for the patient as laid out in the A&P.   LOS: 2 days   Alm Apo, MD [facility] 12/30/2023, 11:18 AM "

## 2023-12-30 NOTE — Plan of Care (Signed)
   Problem: Health Behavior/Discharge Planning: Goal: Ability to manage health-related needs will improve Outcome: Progressing   Problem: Clinical Measurements: Goal: Ability to maintain clinical measurements within normal limits will improve Outcome: Progressing Goal: Will remain free from infection Outcome: Progressing

## 2023-12-31 DIAGNOSIS — R531 Weakness: Secondary | ICD-10-CM | POA: Diagnosis not present

## 2023-12-31 LAB — GLUCOSE, CAPILLARY
Glucose-Capillary: 191 mg/dL — ABNORMAL HIGH (ref 70–99)
Glucose-Capillary: 181 mg/dL — ABNORMAL HIGH (ref 70–99)
Glucose-Capillary: 191 mg/dL — ABNORMAL HIGH (ref 70–99)
Glucose-Capillary: 166 mg/dL — ABNORMAL HIGH (ref 70–99)

## 2023-12-31 NOTE — Progress Notes (Signed)
 Mobility Specialist Progress Note:   12/31/23 1435  Mobility  Activity Pivoted/transferred to/from [facility]  Level of Assistance Moderate assist, patient does 50-74%  Assistive Device Front wheel walker  Distance Ambulated (ft) 2 ft  Activity Response Tolerated well  Mobility Referral Yes  Mobility visit 1 Mobility  Mobility Specialist Start Time (ACUTE ONLY) 1311  Mobility Specialist Stop Time (ACUTE ONLY) 1319  Mobility Specialist Time Calculation (min) (ACUTE ONLY) 8 min   Pt was received in recliner and requested to use BSC. Mod A sit to stand. Returned to [facility] with all needs met. Left in room with RN.  [employer] - Mobility Specialist

## 2023-12-31 NOTE — Progress Notes (Signed)
 Mobility Specialist Progress Note:   12/31/23 1158  Mobility  Activity Pivoted/transferred from bed to chair  Level of Assistance Moderate assist, patient does 50-74%  Assistive Device Front wheel walker  Distance Ambulated (ft) 2 ft  Activity Response Tolerated fair  Mobility Referral Yes  Mobility visit 1 Mobility  Mobility Specialist Start Time (ACUTE ONLY) 1150  Mobility Specialist Stop Time (ACUTE ONLY) 1158  Mobility Specialist Time Calculation (min) (ACUTE ONLY) 8 min   Pt was received in bed and agreed to mobility, opted for xfer to recliner. Mod A sit to stand. Pt had no complaints during session. Returned to recliner with all needs met. Call bell in reach and chair alarm on.  [employer] - Mobility Specialist

## 2023-12-31 NOTE — Progress Notes (Signed)
 " Progress Note    Steve Brown   FMW:[id number]  DOB: Oct 26, 1940  DOA: 12/28/2023     3 PCP: [facility]  Initial CC: Fall, weakness, confusion  Hospital Course: Steve Brown is a 83 y.o. male with medical history significant for dementia, A-fib on Eliquis , BPH, T2DM, GERD, CVA, CAD s/p stenting, DDD, prostate cancer, CKD 3B, anxiety, depression and a recent hospitalization for pneumonia and acute encephalopathy who presented to the ED via EMS for evaluation of altered mental status and generalized weakness.  According to EMS, patient lives alone in an apartment complex. The maintenance noticed patient was weak and altered so he called EMS.  According to daughter, patient has a history of not eating or taking his meds when he is in pain.  Reports that patient has not taken his meds or eating in the last 3 days. Patient reports worsening in his left shoulder pain and a headache but denies any trauma or injury to the shoulder.  He endorses feeling weak but denies any fevers, chills, shortness of breath, chest pain, cough, abdominal pain, nausea, vomiting or dysuria.   He is able to tell his full name, DOB, identify daughter, current location, current president but not the month or the year.   ED Course: Initial vitals show patient afebrile, but hypertensive with SBP in the 140s to 160s. Initial labs significant for glucose 417, BUN/creatinine 28/1.65, lactic acid 2.1-12.6, normal CBC, ethanol level, ammonia and UDS. CT head with no acute intracranial abnormality. CXR shows improved left lung base region with decreasing opacity. Pt received Tylenol , IV Rocephin , azithromycin , IVF bolus and started on insulin  drip. TRH was consulted for admission.    Assessment/Plan   Generalized weakness and deconditioning - In the setting of acute illness but also contribution from dementia - PT/OT evals; likely in need of SNF and/or LTC - Fall precautions   Social determinants of  health Failure to thrive - Daughter is concern about patient's ability to care for self over the last few weeks - Family are looking for available resources for patient ([facility]) to assist with ADLs and medication - TOC consulted for assistance  Acute metabolic encephalopathy  - Patient with dementia who lives alone now presenting with altered mental status for the second time in less than 2 weeks - Patient quite weak appearing with slowed movement, oriented to self, person and place but not to time - Likely combination of severe hyperglycemia, dehydration, recent hospitalization, and some progression of his dementia - Delirium precautions   Severe hyperglycemia Uncontrolled type 2 diabetes - Patient found to have glucose of 417 on admission AG of 16 but no acidosis - Findings due to patient not taking his insulin  over the last few days - Altered mental status on exam but hemodynamically stable - S/p fluids -Continue Semglee  and SSI.  Modifying as needed - A1c 9.8%   CAP - resolved  - X-ray on admission showed improved opacity -Completed antibiotics after last hospitalization -Do not feel he needs ongoing antibiotics given treatment completed -Afebrile and no leukocytosis - Discontinue antibiotics  HTN - BP elevated with SBP in the 140s to 160s - Continue losartan    CKD 3B - Creatinine of 1.65 around recent baseline of 1.5-1.9 - Trend renal function and avoid nephrotoxic agents   Atrial fibrillation - Rate controlled - Continue Eliquis    Chronic left shoulder pain - Apply lidocaine  patch and continue as needed Norco   Dementia - Continued donepezil    BPH - Continue  finasteride  and Flomax    HLD - Continue rosuvastatin    Anxiety and depression - Continue bupropion , Lexapro  and Remeron   Interval History:  No events overnight.  Resting comfortably in bed.  Continues to have slowed mentation and generalized weakness. Otherwise stable and awaiting rehab  placement. Called and updated daughter, Steve Brown today.    Antimicrobials:   DVT prophylaxis:   apixaban  (ELIQUIS ) tablet 5 mg   Code Status:   Code Status: Full Code  Mobility Assessment (Last 72 Hours)     Mobility Assessment     Row Name 12/31/23 0724 12/30/23 2122 12/30/23 1933 12/30/23 1300 12/30/23 0803   Does the patient have exclusion criteria? No- Perform mobility assessment No- Perform mobility assessment No- Perform mobility assessment No- Perform mobility assessment No- Perform mobility assessment   What is the highest level of mobility based on the mobility assessment? Level 4 (Ambulates with assistance) - Balance while stepping forward/back - Complete Level 3 (Stands with assistance) - Balance while standing  and cannot march in place Level 3 (Stands with assistance) - Balance while standing  and cannot march in place Level 3 (Stands with assistance) - Balance while standing  and cannot march in place Level 3 (Stands with assistance) - Balance while standing  and cannot march in place   Is the above level different from baseline mobility prior to current illness? Yes - Recommend PT order Yes - Recommend PT order Yes - Recommend PT order -- --    Row Name 12/29/23 2214 12/29/23 1253 12/29/23 1247 12/29/23 1116 12/29/23 0600   Does the patient have exclusion criteria? No- Perform mobility assessment Yes- Hold (Level 0) - Assessment complete -- -- No- Perform mobility assessment   What is the highest level of mobility based on the mobility assessment? Level 3 (Stands with assistance) - Balance while standing  and cannot march in place -- Level 3 (Stands with assistance) - Balance while standing  and cannot march in place Level 3 (Stands with assistance) - Balance while standing  and cannot march in place Level 3 (Stands with assistance) - Balance while standing  and cannot march in place   Is the above level different from baseline mobility prior to current illness? Yes -  Recommend PT order -- -- -- Yes - Recommend PT order      Diet: Diet Orders (From admission, onward)     Start     Ordered   12/29/23 0128  Diet Carb Modified Room service appropriate? Yes  Diet effective now       Question Answer Comment  Diet-HS Snack? Nothing   Calorie Level Medium 1600-2000   Fluid consistency: Thin   Room service appropriate? Yes      12/29/23 0131            Barriers to discharge: None Disposition Plan: Potentially SNF HH orders placed:  Status is: Inpatient  Objective: Blood pressure (!) 151/86, pulse 97, temperature 97.8 F (36.6 C), resp. rate 16, height 5' 5 (1.651 m), weight 78.2 kg, SpO2 99%.  Examination:  Physical Exam Constitutional:      General: He is not in acute distress.    Comments: Slowed mentation and generalized confusion.  No distress  HENT:     Head: Normocephalic and atraumatic.     Mouth/Throat:     Mouth: Mucous membranes are moist.  Eyes:     Extraocular Movements: Extraocular movements intact.  Cardiovascular:     Rate and Rhythm: Normal rate and regular rhythm.  Pulmonary:     Effort: Pulmonary effort is normal. No respiratory distress.     Breath sounds: Normal breath sounds. No wheezing.  Abdominal:     General: Bowel sounds are normal. There is no distension.     Palpations: Abdomen is soft.     Tenderness: There is no abdominal tenderness.  Musculoskeletal:        General: Normal range of motion.     Cervical back: Normal range of motion and neck supple.  Skin:    General: Skin is warm and dry.  Neurological:     Mental Status: He is disoriented.     Motor: Weakness (Generalized.  Nonfocal) present.  Psychiatric:        Mood and Affect: Mood normal.        Behavior: Behavior normal.      Consultants:    Procedures:    Data Reviewed: Results for orders placed or performed during the hospital encounter of 12/28/23 (from the past 24 hours)  Glucose, capillary     Status: Abnormal   Collection  Time: 12/30/23  4:21 PM  Result Value Ref Range   Glucose-Capillary 162 (H) 70 - 99 mg/dL  Glucose, capillary     Status: None   Collection Time: 12/30/23  8:20 PM  Result Value Ref Range   Glucose-Capillary 80 70 - 99 mg/dL  Glucose, capillary     Status: None   Collection Time: 12/30/23 10:05 PM  Result Value Ref Range   Glucose-Capillary 75 70 - 99 mg/dL  Glucose, capillary     Status: Abnormal   Collection Time: 12/31/23  7:44 AM  Result Value Ref Range   Glucose-Capillary 191 (H) 70 - 99 mg/dL  Glucose, capillary     Status: Abnormal   Collection Time: 12/31/23 12:00 PM  Result Value Ref Range   Glucose-Capillary 181 (H) 70 - 99 mg/dL    I have reviewed pertinent nursing notes, vitals, labs, and images as necessary. I have ordered labwork to follow up on as indicated.  I have reviewed the last notes from staff over past 24 hours. I have discussed patient's care plan and test results with nursing staff, CM/SW, and other staff as appropriate.  Old records reviewed in assessment of this patient    LOS: 3 days   Alm Apo, MD [facility] 12/31/2023, 3:02 PM "

## 2023-12-31 NOTE — Plan of Care (Signed)
" °  Problem: Education: Goal: Knowledge of General Education information will improve Description: Including pain rating scale, medication(s)/side effects and non-pharmacologic comfort measures Outcome: Progressing   Problem: Clinical Measurements: Goal: Will remain free from infection Outcome: Progressing   Problem: Coping: Goal: Level of anxiety will decrease Outcome: Progressing   Problem: Elimination: Goal: Will not experience complications related to bowel motility Outcome: Progressing Goal: Will not experience complications related to urinary retention Outcome: Progressing   Problem: Pain Managment: Goal: General experience of comfort will improve and/or be controlled Outcome: Progressing   Problem: Safety: Goal: Ability to remain free from injury will improve Outcome: Progressing   Problem: Coping: Goal: Ability to adjust to condition or change in health will improve Outcome: Progressing   "

## 2024-01-01 DIAGNOSIS — R531 Weakness: Secondary | ICD-10-CM | POA: Diagnosis not present

## 2024-01-01 LAB — GLUCOSE, CAPILLARY
Glucose-Capillary: 134 mg/dL — ABNORMAL HIGH (ref 70–99)
Glucose-Capillary: 177 mg/dL — ABNORMAL HIGH (ref 70–99)
Glucose-Capillary: 175 mg/dL — ABNORMAL HIGH (ref 70–99)
Glucose-Capillary: 42 mg/dL — CL (ref 70–99)
Glucose-Capillary: 57 mg/dL — ABNORMAL LOW (ref 70–99)
Glucose-Capillary: 117 mg/dL — ABNORMAL HIGH (ref 70–99)
Glucose-Capillary: 67 mg/dL — ABNORMAL LOW (ref 70–99)

## 2024-01-01 NOTE — Progress Notes (Signed)
 Occupational Therapy Treatment Patient Details Name: Steve Brown MRN: [id number] DOB: 04-07-1940 Today's Date: 01/01/2024   History of present illness Patient is a 83 yr old male admitted 12/28/2023 with altered mental status and generalized weakness. Recent hospitalization last week for pneumonia and acute encephalopathy. PMH: dementia, A-fib on Eliquis ,/ BPH, T2DM, GERD, CVA, CAD s/p stenting, DDD, prostate cancer, CKD 3B, anxiety, depression.   OT comments  The pt was received seated in the bedside chair. He was instructed on then performed BUE and BLE therapeutic exercises for strengthening needed to facilitate progressive ADL performance. He used a resistance band to perform 10 reps and 1 set of bicep curls, lateral pulls, and tricep extensions; he required SBA while seated in the chair. He subsequently required min assist to stand using a RW, then to perform marching in place for ~30 seconds before he requested to sit back down, due to feelings of fatigue and weakness. He then required set-up assist to perform face washing in sitting. Overall, he presented with good effort and participation. He reported having moderate headache and L shoulder pain. Continue OT plan of care. Patient will benefit from continued inpatient follow up therapy, <3 hours/day.      If plan is discharge home, recommend the following:  A lot of help with bathing/dressing/bathroom;Assistance with cooking/housework;Assist for transportation;Direct supervision/assist for financial management;Help with stairs or ramp for entrance;Direct supervision/assist for medications management;Supervision due to cognitive status   Equipment Recommendations  Other (comment) (defer to next setting)    Recommendations for Other Services      Precautions / Restrictions Precautions Precautions: Fall Restrictions Weight Bearing Restrictions Per Provider Order: No       Mobility Bed Mobility      General bed mobility  comments: pt was received seated in the bedside chair    Transfers Overall transfer level: Needs assistance Equipment used: Rolling walker (2 wheels) Transfers: Sit to/from Stand             General transfer comment: The pt required cues to trunk shift forward and to bend knees further back before attempting to stand. Once in standing, he was instructed on marching in place, for which he required min assist     Balance     Sitting balance-Leahy Scale: Fair       Standing balance-Leahy Scale: Poor         ADL either performed or assessed with clinical judgement   ADL Overall ADL's : Needs assistance/impaired Eating/Feeding: Independent;Sitting Eating/Feeding Details (indicate cue type and reason): The pt took his medicine an drank from a cup seated in the bedside chair. Grooming: Set up;Sitting Grooming Details (indicate cue type and reason): The pt performed face washing seated in the bedside chair.                   Communication Communication Communication: No apparent difficulties   Cognition Arousal: Alert Behavior During Therapy: WFL for tasks assessed/performed, Flat affect Cognition: History of cognitive impairments       Memory impairment (select all impairments): Short-term memory   Executive functioning impairment (select all impairments): Organization        Following commands: Impaired Following commands impaired: Follows multi-step commands inconsistently, Follows multi-step commands with increased time      Cueing   Cueing Techniques: Verbal cues             Pertinent Vitals/ Pain       Pain Assessment Pain Assessment: Faces Pain Score: 4  Pain Location:  head and left shoulder Pain Descriptors / Indicators: Aching, Sore, Discomfort Pain Intervention(s): Patient requesting pain meds-RN notified, Monitored during session, Repositioned   Frequency  Min 2X/week        Progress Toward Goals  OT Goals(current goals can now  be found in the care plan section)  Progress towards OT goals: Progressing toward goals  Acute Rehab OT Goals Patient Stated Goal: he did not particulary state OT Goal Formulation: With patient Time For Goal Achievement: 01/12/24 Potential to Achieve Goals: Good  Plan         AM-PAC OT 6 Clicks Daily Activity     Outcome Measure   Help from another person eating meals?: None Help from another person taking care of personal grooming?: A Little Help from another person toileting, which includes using toliet, bedpan, or urinal?: A Lot Help from another person bathing (including washing, rinsing, drying)?: A Lot Help from another person to put on and taking off regular upper body clothing?: A Little Help from another person to put on and taking off regular lower body clothing?: A Lot 6 Click Score: 16    End of Session Equipment Utilized During Treatment: Gait belt;Rolling walker (2 wheels)  OT Visit Diagnosis: Unsteadiness on feet (R26.81);Muscle weakness (generalized) (M62.81);Pain;Other abnormalities of gait and mobility (R26.89) Pain - Right/Left: Left Pain - part of body: Shoulder (and head)   Activity Tolerance Patient tolerated treatment well   Patient Left in chair;with call bell/phone within reach;with chair alarm set   Nurse Communication Mobility status        Time: 8974-8955 OT Time Calculation (min): 19 min  Charges: OT General Charges $OT Visit: 1 Visit    Delanna JINNY Lesches, OTR/L 01/01/2024, 12:17 PM

## 2024-01-01 NOTE — Progress Notes (Signed)
 Mobility Specialist Progress Note:   01/01/24 1018  Mobility  Activity Pivoted/transferred from bed to chair  Level of Assistance Minimal assist, patient does 75% or more  Assistive Device Front wheel walker  Distance Ambulated (ft) 2 ft  Activity Response Tolerated well  Mobility Referral Yes  Mobility visit 1 Mobility  Mobility Specialist Start Time (ACUTE ONLY) 1001  Mobility Specialist Stop Time (ACUTE ONLY) 1014  Mobility Specialist Time Calculation (min) (ACUTE ONLY) 13 min   Pt was received in bed and agreed to mobility. Min A sit to stand. Returned to recliner with all needs met. Call bell in reach and chair alarm on.  [employer] - Mobility Specialist

## 2024-01-01 NOTE — Plan of Care (Signed)

## 2024-01-01 NOTE — Progress Notes (Signed)
 " Progress Note    Steve Brown   FMW:[id number]  DOB: 08-30-1940  DOA: 12/28/2023     4 PCP: [facility]  Initial CC: Fall, weakness, confusion  Hospital Course: Steve Brown is a 83 y.o. male with medical history significant for dementia, A-fib on Eliquis , BPH, T2DM, GERD, CVA, CAD s/p stenting, DDD, prostate cancer, CKD 3B, anxiety, depression and a recent hospitalization for pneumonia and acute encephalopathy who presented to the ED via EMS for evaluation of altered mental status and generalized weakness.  According to EMS, patient lives alone in an apartment complex. The maintenance noticed patient was weak and altered so he called EMS.  According to daughter, patient has a history of not eating or taking his meds when he is in pain.  Reports that patient has not taken his meds or eating in the last 3 days. Patient reports worsening in his left shoulder pain and a headache but denies any trauma or injury to the shoulder.  He endorses feeling weak but denies any fevers, chills, shortness of breath, chest pain, cough, abdominal pain, nausea, vomiting or dysuria.   He is able to tell his full name, DOB, identify daughter, current location, current president but not the month or the year.   ED Course: Initial vitals show patient afebrile, but hypertensive with SBP in the 140s to 160s. Initial labs significant for glucose 417, BUN/creatinine 28/1.65, lactic acid 2.1-12.6, normal CBC, ethanol level, ammonia and UDS. CT head with no acute intracranial abnormality. CXR shows improved left lung base region with decreasing opacity. Pt received Tylenol , IV Rocephin , azithromycin , IVF bolus and started on insulin  drip. TRH was consulted for admission.    Assessment/Plan   Generalized weakness and deconditioning - In the setting of acute illness but also contribution from dementia - PT/OT evals; likely in need of SNF and/or LTC - Fall precautions   Social determinants of  health Failure to thrive - Daughter is concern about patient's ability to care for self over the last few weeks - Family are looking for available resources for patient ([facility] patient) to assist with ADLs and medication - TOC consulted for assistance  Acute metabolic encephalopathy  - Patient with dementia who lives alone now presenting with altered mental status for the second time in less than 2 weeks - Patient quite weak appearing with slowed movement, oriented to self, person and place but not to time - Likely combination of severe hyperglycemia, dehydration, recent hospitalization, and some progression of his dementia - Delirium precautions   Severe hyperglycemia Uncontrolled type 2 diabetes - Patient found to have glucose of 417 on admission AG of 16 but no acidosis - Findings due to patient not taking his insulin  over the last few days - Altered mental status on exam but hemodynamically stable - S/p fluids -Continue Semglee  and SSI.  Modifying as needed - A1c 9.8%   CAP - resolved  - X-ray on admission showed improved opacity -Completed antibiotics after last hospitalization -Do not feel he needs ongoing antibiotics given treatment completed -Afebrile and no leukocytosis - Discontinue antibiotics  HTN - BP elevated with SBP in the 140s to 160s - Continue losartan    CKD 3B - Creatinine of 1.65 around recent baseline of 1.5-1.9 - Trend renal function and avoid nephrotoxic agents   Atrial fibrillation - Rate controlled - Continue Eliquis    Chronic left shoulder pain - Apply lidocaine  patch and continue as needed Norco   Dementia - Continued donepezil    BPH - Continue  finasteride  and Flomax    HLD - Continue rosuvastatin    Anxiety and depression - Continue bupropion , Lexapro  and Remeron   Interval History:  No events overnight.  Resting comfortably in bed.  Continues to have slowed mentation and generalized weakness. Otherwise stable and awaiting rehab  placement. [facility] request to be [facility].    Antimicrobials:   DVT prophylaxis:   apixaban  (ELIQUIS ) tablet 5 mg   Code Status:   Code Status: Full Code  Mobility Assessment (Last 72 Hours)     Mobility Assessment     Row Name 01/01/24 1214 01/01/24 0750 12/31/23 2027 12/31/23 0724 12/30/23 2122   Does the patient have exclusion criteria? -- No- Perform mobility assessment No- Perform mobility assessment No- Perform mobility assessment No- Perform mobility assessment   What is the highest level of mobility based on the mobility assessment? Level 4 (Ambulates with assistance) - Balance while stepping forward/back - Complete Level 4 (Ambulates with assistance) - Balance while stepping forward/back - Complete Level 4 (Ambulates with assistance) - Balance while stepping forward/back - Complete Level 4 (Ambulates with assistance) - Balance while stepping forward/back - Complete Level 3 (Stands with assistance) - Balance while standing  and cannot march in place   Is the above level different from baseline mobility prior to current illness? -- Yes - Recommend PT order Yes - Recommend PT order Yes - Recommend PT order Yes - Recommend PT order    Row Name 12/30/23 1933 12/30/23 1300 12/30/23 0803 12/29/23 2214     Does the patient have exclusion criteria? No- Perform mobility assessment No- Perform mobility assessment No- Perform mobility assessment No- Perform mobility assessment    What is the highest level of mobility based on the mobility assessment? Level 3 (Stands with assistance) - Balance while standing  and cannot march in place Level 3 (Stands with assistance) - Balance while standing  and cannot march in place Level 3 (Stands with assistance) - Balance while standing  and cannot march in place Level 3 (Stands with assistance) - Balance while standing  and cannot march in place    Is the above level different from baseline mobility prior to current illness? Yes - Recommend PT order -- -- Yes -  Recommend PT order       Diet: Diet Orders (From admission, onward)     Start     Ordered   12/29/23 0128  Diet Carb Modified Room service appropriate? Yes  Diet effective now       Question Answer Comment  Diet-HS Snack? Nothing   Calorie Level Medium 1600-2000   Fluid consistency: Thin   Room service appropriate? Yes      12/29/23 0131            Barriers to discharge: None Disposition Plan: Potentially SNF HH orders placed:  Status is: Inpatient  Objective: Blood pressure (!) 160/84, pulse 74, temperature 97.8 F (36.6 C), temperature source Oral, resp. rate 16, height 5' 5 (1.651 m), weight 78.2 kg, SpO2 99%.  Examination:  Physical Exam Constitutional:      General: He is not in acute distress.    Comments: Slowed mentation and generalized confusion.  No distress  HENT:     Head: Normocephalic and atraumatic.     Mouth/Throat:     Mouth: Mucous membranes are moist.  Eyes:     Extraocular Movements: Extraocular movements intact.  Cardiovascular:     Rate and Rhythm: Normal rate and regular rhythm.  Pulmonary:  Effort: Pulmonary effort is normal. No respiratory distress.     Breath sounds: Normal breath sounds. No wheezing.  Abdominal:     General: Bowel sounds are normal. There is no distension.     Palpations: Abdomen is soft.     Tenderness: There is no abdominal tenderness.  Musculoskeletal:        General: Normal range of motion.     Cervical back: Normal range of motion and neck supple.  Skin:    General: Skin is warm and dry.  Neurological:     Mental Status: He is disoriented.     Motor: Weakness (Generalized.  Nonfocal) present.  Psychiatric:        Mood and Affect: Mood normal.        Behavior: Behavior normal.      Consultants:    Procedures:    Data Reviewed: Results for orders placed or performed during the hospital encounter of 12/28/23 (from the past 24 hours)  Glucose, capillary     Status: Abnormal   Collection Time:  12/31/23  4:52 PM  Result Value Ref Range   Glucose-Capillary 191 (H) 70 - 99 mg/dL  Glucose, capillary     Status: Abnormal   Collection Time: 12/31/23  8:38 PM  Result Value Ref Range   Glucose-Capillary 166 (H) 70 - 99 mg/dL  Glucose, capillary     Status: Abnormal   Collection Time: 01/01/24  7:22 AM  Result Value Ref Range   Glucose-Capillary 134 (H) 70 - 99 mg/dL   Comment 1 Notify RN   Glucose, capillary     Status: Abnormal   Collection Time: 01/01/24 11:30 AM  Result Value Ref Range   Glucose-Capillary 177 (H) 70 - 99 mg/dL   Comment 1 Notify RN     I have reviewed pertinent nursing notes, vitals, labs, and images as necessary. I have ordered labwork to follow up on as indicated.  I have reviewed the last notes from staff over past 24 hours. I have discussed patient's care plan and test results with nursing staff, CM/SW, and other staff as appropriate.  Old records reviewed in assessment of this patient    LOS: 4 days   Alm Apo, MD [facility] 01/01/2024, 3:31 PM "

## 2024-01-01 NOTE — Progress Notes (Signed)
 Mobility Specialist Progress Note:   01/01/24 1550  Mobility  Activity Pivoted/transferred from chair to bed  Level of Assistance Minimal assist, patient does 75% or more  Assistive Device Front wheel walker  Distance Ambulated (ft) 2 ft  Activity Response Tolerated well  Mobility Referral Yes  Mobility visit 1 Mobility  Mobility Specialist Start Time (ACUTE ONLY) 1520  Mobility Specialist Stop Time (ACUTE ONLY) 1530  Mobility Specialist Time Calculation (min) (ACUTE ONLY) 10 min   Pt was received in recliner and agreed to mobility. Min A sit to stand. Returned to bed with all needs met. Call bell in reach and bed alarm on.  [employer] - Mobility Specialist

## 2024-01-01 NOTE — TOC Initial Note (Addendum)
 Transition of Care [facility]) - Initial/Assessment Note    Patient Details  Name: Steve Brown MRN: [id number] Date of Birth: [birth year]  Transition of Care [facility] CM/SW Contact:    Heather DELENA Saltness, LCSW Phone Number: 01/01/2024, 2:29 PM  Clinical Narrative:                  ADDENDUM  CSW attempted to fax CLC checklist and referral packet to [facility], at [phone], but busy signal. CSW will attempt to fax referral tomorrow morning.  Pt admitted to the hospital from home due to altered mental status and generalized weakness. Pt lives at home alone. Pt alert and oriented x2 at baseline. CSW spoke with pt's daughter, Dearl Rudden [phone], about PT's recommendation for short-term SNF rehab. Pt's daughter agreeable with recommendation. CSW completed [facility] checklist, awaiting MD's signature. Once form is signed by MD, CSW will fax referral packet to [facility]. TOC will continue to follow.   Expected Discharge Plan: Skilled Nursing Facility Barriers to Discharge: Continued Medical Work up, SNF Pending bed offer   Patient Goals and CMS Choice Patient states their goals for this hospitalization and ongoing recovery are:: To go to SNF rehab CMS Medicare.gov Compare Post Acute Care list provided to:: Patient Represenative (must comment) Choice offered to / list presented to : Adult Children [place] ownership interest in [facility].provided to:: Adult Children    Expected Discharge Plan and Services In-house Referral: Clinical Social Work Discharge Planning Services: NA Post Acute Care Choice: Skilled Nursing Facility Living arrangements for the past 2 months: Apartment                 DME Arranged: N/A DME Agency: NA       HH Arranged: NA HH Agency: NA        Prior Living Arrangements/Services Living arrangements for the past 2 months: Apartment Lives with:: Self Patient language and need for interpreter reviewed:: Yes Do you feel safe going back  to the place where you live?: No   Pt lives alone, confused at baseline  Need for Family Participation in Patient Care: Yes (Comment) Care giver support system in place?: Yes (comment) Current home services: DME Criminal Activity/Legal Involvement Pertinent to Current Situation/Hospitalization: No - Comment as needed  Activities of Daily Living   ADL Screening (condition at time of admission) Independently performs ADLs?: No Does the patient have a NEW difficulty with bathing/dressing/toileting/self-feeding that is expected to last >3 days?: No Does the patient have a NEW difficulty with getting in/out of bed, walking, or climbing stairs that is expected to last >3 days?: No Does the patient have a NEW difficulty with communication that is expected to last >3 days?: No Is the patient deaf or have difficulty hearing?: No Does the patient have difficulty seeing, even when wearing glasses/contacts?: No Does the patient have difficulty concentrating, remembering, or making decisions?: No  Permission Sought/Granted Permission sought to share information with : Family Supports, Case Manager Permission granted to share information with : Yes, Verbal Permission Granted  Share Information with NAME: Shepherd Finnan  Permission granted to share info w AGENCY: [facility]  Permission granted to share info w Relationship: Daughter  Permission granted to share info w Contact Information: [phone]  Emotional Assessment Appearance:: Appears stated age Attitude/Demeanor/Rapport: Unable to Assess Affect (typically observed): Unable to Assess Orientation: : Oriented to Self Alcohol / Substance Use: Not Applicable Psych Involvement: No (comment)  Admission diagnosis:  Acute encephalopathy [G93.40] Community acquired pneumonia,  unspecified laterality [J18.9] Hyperosmolar hyperglycemic state (HHS) (HCC) [E11.00] Patient Active Problem List   Diagnosis Date Noted   Acute encephalopathy  12/28/2023   Failure to thrive in adult 12/28/2023   CKD stage 3b, GFR 30-44 ml/min (HCC) 12/28/2023   Paroxysmal atrial fibrillation (HCC) 12/28/2023   Severe hyperglycemia due to diabetes mellitus (HCC) 12/28/2023   Community acquired pneumonia 12/19/2023   Elevated troponin 12/18/2023   Encephalopathy 12/17/2023   Headache, temporal 11/21/2023   Left facial numbness 09/29/2023   Left arm numbness 09/29/2023   Left sided numbness 09/28/2023   Jaw pain, non-TMJ 03/20/2022   Temporal pain 03/20/2022   Jaw claudication 03/20/2022   Dizziness on standing 05/18/2021   Vision changes 05/18/2021   Chronic ischemic vertebrobasilar artery cerebellar stroke 05/18/2021   Amaurosis fugax, both eyes 01/18/2021   Transient diplopia 01/18/2021   Hereditary and idiopathic neuropathy, unspecified 11/20/2020   Hyperglycemia due to type 2 diabetes mellitus (HCC) 11/20/2020   Obesity 11/20/2020   Pure hypercholesterolemia 11/20/2020   Syncope 05/26/2020   Back pain 05/26/2020   Lumbar spinal stenosis 08/15/2019   Nonspecific chest pain 08/11/2019   Shortness of breath    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 (HCC) 10/21/2018   Nocturnal hypoxemia 10/21/2018   Radiation therapy complication 07/31/2017   Primary prostate cancer (HCC) 07/31/2017   Anemia 07/31/2017   Vertigo due to cerebrovascular disease 07/31/2017   Poor compliance with CPAP treatment 07/31/2017   Absolute anemia 05/16/2017   Avitaminosis D 05/16/2017   Benign essential HTN 05/16/2017   Benign prostatic hyperplasia without urinary obstruction 05/16/2017   Clinical depression 05/16/2017   Constipation 05/16/2017   Current drug use 05/16/2017   Diabetic neuropathy (HCC) 05/16/2017   Genital herpes 05/16/2017   Infarction of lung due to iatrogenic pulmonary embolism (HCC) 05/16/2017   Sciatica associated with disorder of lumbar spine 05/16/2017   Coronary artery disease involving native coronary artery of  native heart 05/16/2017   Artery disease, cerebral 05/16/2017   Apnea, sleep 05/16/2017   Arthralgia of hip or thigh 05/16/2017   Malignant neoplasm of prostate (HCC) 09/28/2016   Vascular dementia in remission (HCC) 09/28/2016   Remote history of stroke 09/28/2016   Acute kidney injury 05/18/2016   Dehydration 05/18/2016   Near syncope 05/18/2016   Orthostatic hypotension 05/18/2016   CKD (chronic kidney disease), stage II 04/26/2016   UTI (urinary tract infection) 04/26/2016   Encounter for counseling on use of CPAP 11/18/2015   Chronic cholecystitis with calculus 11/02/2015   Stroke, 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HCC) 04/22/2015   TIA (transient ischemic attack) 11/03/2014   Obstructive sleep apnea 05/18/2014   White matter disease 08/14/2013   Abnormal x-ray of temporomandibular joint 05/30/2013   Chronic infection of sinus 05/30/2013   Cough 05/30/2013   Generalized weakness 05/30/2013   Unsteady gait 05/19/2013   Combined fat and carbohydrate induced hyperlipemia 04/24/2013   Syncope 03/20/2013   Angina pectoris (HCC) 10/08/2012   Hypertension    Stroke (HCC)    History of TIAs    Lumbago    Other and unspecified hyperlipidemia    History of cardiovascular disorder    Unwitnessed fall    Pain in joint, multiple sites    Degeneration of cervical intervertebral disc    Cardiovascular disease    History of pulmonary embolism: June 2014,  Takes Xarelto  07/01/2012    Class: History of   Hemiparesis (HCC) 07/18/2011   Diabetes mellitus type 2 with complications (HCC) 07/18/2011   CAD in native  artery 07/18/2011   History of recurrent TIAs 07/18/2011   PCP:  [facility] Pharmacy:   [id number] [place], [place] - [address] [place] [address] Phone: [phone] Fax: [phone]  [facility] - [place], [place] - [address] [place] [address] Phone: [phone] Fax:  [phone]  [facility] [address] [place] [address] Phone: [phone] Fax: [phone]   Social Drivers of Health (SDOH) Social History: SDOH Screenings   Food Insecurity: No Food Insecurity (12/29/2023)  Housing: Low Risk (12/29/2023)  Transportation Needs: No Transportation Needs (12/29/2023)  Utilities: Not At Risk (12/29/2023)  Depression (PHQ2-9): Low Risk (12/14/2023)  Social Connections: Moderately Integrated (12/29/2023)  Tobacco Use: Low Risk (12/28/2023)   SDOH Interventions: None     Readmission Risk Interventions    01/01/2024    2:26 PM 12/19/2023    3:48 PM  Readmission Risk Prevention Plan  Transportation Screening Complete Complete  Medication Review (RN Care Manager) Complete Referral to Pharmacy  PCP or Specialist appointment within 3-5 days of discharge Complete Complete  HRI or Home Care Consult Complete Complete  SW Recovery Care/Counseling Consult Complete Complete  Palliative Care Screening Not Applicable Complete  Skilled Nursing Facility Complete Not Applicable    Signed: Heather Saltness, MSW, LCSW Clinical Social Worker Inpatient Care Management 01/01/2024 2:34 PM

## 2024-01-01 NOTE — Progress Notes (Signed)
 Physical Therapy Treatment Patient Details Name: Steve Brown MRN: [id number] DOB: 12-06-1940 Today's Date: 01/01/2024   History of Present Illness Patient is a 83 y.o. male admitted 12/28/2023 with altered mental status and generalized weakness. Recent hospitalization last week for pneumonia and acute encephalopathy. PMH: dementia, A-fib on Eliquis ,/ BPH, T2DM, GERD, CVA, CAD s/p stenting, DDD, prostate cancer, CKD 3B, anxiety, depression.    PT Comments   Pt admitted with above diagnosis.  Pt currently with functional limitations due to the deficits listed below (see PT Problem List). Pt in bed when PT arrived. Pt reported feeling cold, having HA and R shoulder pain. Pt agreeable to therapy intervention. Pt required increased time and CGA with use of hospital bed for supine to sit, CGA and cues for sit to stand  from EOB, gait assessed with RW, CGA and quickly progressing to S and min cues for 120 feet, pt requested to return to bed and required CGA to flat surface and min cues. Pt left in bed all needs in place, nurse present and aware of pt pain report.  Pt will benefit from acute skilled PT to increase their independence and safety with mobility to allow discharge.      If plan is discharge home, recommend the following: Help with stairs or ramp for entrance;Assistance with cooking/housework;A little help with walking and/or transfers;A little help with bathing/dressing/bathroom;Assist for transportation   Can travel by private vehicle     Yes  Equipment Recommendations  None recommended by PT    Recommendations for Other Services       Precautions / Restrictions Precautions Precautions: Fall Recall of Precautions/Restrictions: Intact Restrictions Weight Bearing Restrictions Per Provider Order: No     Mobility  Bed Mobility Overal bed mobility: Needs Assistance Bed Mobility: Supine to Sit, Sit to Supine     Supine to sit: Contact guard, HOB elevated, Used rails Sit to  supine: Contact guard assist, Used rails (bed flat)   General bed mobility comments: min cues, increased time    Transfers Overall transfer level: Needs assistance Equipment used: Rolling walker (2 wheels) Transfers: Sit to/from Stand Sit to Stand: Contact guard assist, From elevated surface           General transfer comment: min cues, push to stand    Ambulation/Gait Ambulation/Gait assistance: Supervision Gait Distance (Feet): 120 Feet Assistive device: Rolling walker (2 wheels) Gait Pattern/deviations: Step-to pattern, Trunk flexed Gait velocity: decreased     General Gait Details: slight trunk flexion with B UE support at RW and step almost through pattern with limited foot clearance, no overt LOB   Stairs             Wheelchair Mobility     Tilt Bed    Modified Rankin (Stroke Patients Only)       Balance Overall balance assessment: Needs assistance Sitting-balance support: Feet supported Sitting balance-Leahy Scale: Good     Standing balance support: Bilateral upper extremity supported, During functional activity, Reliant on assistive device for balance Standing balance-Leahy Scale: Fair Standing balance comment: static standing no UE support                            Communication Communication Communication: No apparent difficulties  Cognition Arousal: Alert Behavior During Therapy: WFL for tasks assessed/performed, Flat affect   PT - Cognitive impairments: No family/caregiver present to determine baseline, Memory  Following commands: Impaired Following commands impaired: Follows multi-step commands inconsistently, Follows multi-step commands with increased time    Cueing Cueing Techniques: Verbal cues  Exercises      General Comments        Pertinent Vitals/Pain Pain Assessment Pain Assessment: Faces Faces Pain Scale: Hurts little more Pain Location: head and R shoulder Pain  Descriptors / Indicators: Aching, Sore, Discomfort, Headache Pain Intervention(s): Limited activity within patient's tolerance, Monitored during session, Repositioned, Patient requesting pain meds-RN notified (meds at end of PT session)    Home Living                          Prior Function            PT Goals (current goals can now be found in the care plan section) Acute Rehab PT Goals Patient Stated Goal: to get stronger PT Goal Formulation: With patient Time For Goal Achievement: 01/11/24 Potential to Achieve Goals: Good Progress towards PT goals: Progressing toward goals    Frequency    Min 2X/week      PT Plan      Co-evaluation              AM-PAC PT 6 Clicks Mobility   Outcome Measure  Help needed turning from your back to your side while in a flat bed without using bedrails?: A Little Help needed moving from lying on your back to sitting on the side of a flat bed without using bedrails?: A Little Help needed moving to and from a bed to a chair (including a wheelchair)?: A Little Help needed standing up from a chair using your arms (e.g., wheelchair or bedside chair)?: A Little Help needed to walk in hospital room?: A Little Help needed climbing 3-5 steps with a railing? : A Lot 6 Click Score: 17    End of Session Equipment Utilized During Treatment: Gait belt Activity Tolerance: Patient tolerated treatment well Patient left: with call bell/phone within reach;with bed alarm set;in bed;with nursing/sitter in room Nurse Communication: Mobility status;Patient requests pain meds PT Visit Diagnosis: Muscle weakness (generalized) (M62.81);Unsteadiness on feet (R26.81);Other abnormalities of gait and mobility (R26.89)     Time: 8287-8267 PT Time Calculation (min) (ACUTE ONLY): 20 min  Charges:    $Gait Training: 8-22 mins PT General Charges $$ ACUTE PT VISIT: 1 Visit                     [place], PT Acute Rehab    [place] VEAR Drone 01/01/2024, 6:36 PM

## 2024-01-02 DIAGNOSIS — R531 Weakness: Secondary | ICD-10-CM | POA: Diagnosis not present

## 2024-01-02 LAB — GLUCOSE, CAPILLARY
Glucose-Capillary: 173 mg/dL — ABNORMAL HIGH (ref 70–99)
Glucose-Capillary: 195 mg/dL — ABNORMAL HIGH (ref 70–99)
Glucose-Capillary: 226 mg/dL — ABNORMAL HIGH (ref 70–99)
Glucose-Capillary: 88 mg/dL (ref 70–99)
Glucose-Capillary: 189 mg/dL — ABNORMAL HIGH (ref 70–99)

## 2024-01-02 MED ORDER — MELATONIN 5 MG PO TABS
5.0000 mg | ORAL_TABLET | Freq: Every evening | ORAL | Status: DC | PRN
  Administered 2024-01-02: 5 mg via ORAL
  Filled 2024-01-02: qty 1

## 2024-01-02 NOTE — Progress Notes (Signed)
 " Progress Note    Steve Brown   FMW:[id number]  DOB: Jun 22, 1940  DOA: 12/28/2023     5 PCP: [facility]  Initial CC: Fall, weakness, confusion  Hospital Course: Steve Brown is a 83 y.o. male with medical history significant for dementia, A-fib on Eliquis , BPH, T2DM, GERD, CVA, CAD s/p stenting, DDD, prostate cancer, CKD 3B, anxiety, depression and a recent hospitalization for pneumonia and acute encephalopathy who presented to the ED via EMS for evaluation of altered mental status and generalized weakness.  According to EMS, patient lives alone in an apartment complex. The maintenance noticed patient was weak and altered so he called EMS.  According to daughter, patient has a history of not eating or taking his meds when he is in pain.  Reports that patient has not taken his meds or eating in the last 3 days. Patient reports worsening in his left shoulder pain and a headache but denies any trauma or injury to the shoulder.  He endorses feeling weak but denies any fevers, chills, shortness of breath, chest pain, cough, abdominal pain, nausea, vomiting or dysuria.   He is able to tell his full name, DOB, identify daughter, current location, current president but not the month or the year.   ED Course: Initial vitals show patient afebrile, but hypertensive with SBP in the 140s to 160s. Initial labs significant for glucose 417, BUN/creatinine 28/1.65, lactic acid 2.1-12.6, normal CBC, ethanol level, ammonia and UDS. CT head with no acute intracranial abnormality. CXR shows improved left lung base region with decreasing opacity. Pt received Tylenol , IV Rocephin , azithromycin , IVF bolus and started on insulin  drip. TRH was consulted for admission.    Assessment/Plan:   Generalized weakness and deconditioning - In the setting of acute illness but also contribution from dementia - PT/OT evals; likely in need of SNF and/or LTC - Fall precautions   Social determinants of  health Failure to thrive - Daughter is concern about patient's ability to care for self over the last few weeks - Family are looking for available resources for patient ([facility] patient) to assist with ADLs and medication - TOC consulted for assistance  Acute metabolic encephalopathy - resolved  - Patient with dementia who lives alone now presenting with altered mental status for the second time in less than 2 weeks - Patient quite weak appearing with slowed movement, oriented to self, person and place but not to time - Likely combination of severe hyperglycemia, dehydration, recent hospitalization, and some progression of his dementia - Delirium precautions  Dementia - Continued donepezil    Severe hyperglycemia Uncontrolled type 2 diabetes - Patient found to have glucose of 417 on admission AG of 16 but no acidosis - Findings due to patient not taking his insulin  over the last few days - S/p fluids -Continue Semglee  and SSI.  Modifying as needed - A1c 9.8 - Some hypoglycemia noted yesterday evening.  Will hold prandial coverage for now   CAP - resolved  - X-ray on admission showed improved opacity -Completed antibiotics after last hospitalization -Do not feel he needs ongoing antibiotics given treatment completed -Afebrile and no leukocytosis - Discontinue antibiotics  HTN - BP elevated with SBP in the 140s to 160s - Continue losartan    CKD 3B - Creatinine of 1.65 around recent baseline of 1.5-1.9 - Trend renal function and avoid nephrotoxic agents   Atrial fibrillation - Rate controlled - Continue Eliquis    Chronic left shoulder pain - Apply lidocaine  patch and continue as needed Norco  BPH - Continue finasteride  and Flomax    HLD - Continue rosuvastatin    Anxiety and depression - Continue bupropion , Lexapro  and Remeron   Interval History:  No events overnight.  Resting comfortably in bed.  Continues to have slowed mentation and generalized weakness. Otherwise  stable and awaiting rehab placement. [facility] request [facility].  Daughter present bedside this morning and updated as well.  Antimicrobials:   DVT prophylaxis:   apixaban  (ELIQUIS ) tablet 5 mg   Code Status:   Code Status: Full Code  Mobility Assessment (Last 72 Hours)     Mobility Assessment     Row Name 01/02/24 0900 01/01/24 2213 01/01/24 1800 01/01/24 1214 01/01/24 0750   Does the patient have exclusion criteria? No- Perform mobility assessment No- Perform mobility assessment -- -- No- Perform mobility assessment   What is the highest level of mobility based on the mobility assessment? Level 4 (Ambulates with assistance) - Balance while stepping forward/back - Complete Level 4 (Ambulates with assistance) - Balance while stepping forward/back - Complete Level 4 (Ambulates with assistance) - Balance while stepping forward/back - Complete Level 4 (Ambulates with assistance) - Balance while stepping forward/back - Complete Level 4 (Ambulates with assistance) - Balance while stepping forward/back - Complete   Is the above level different from baseline mobility prior to current illness? Yes - Recommend PT order Yes - Recommend PT order -- -- Yes - Recommend PT order    Row Name 12/31/23 2027 12/31/23 0724 12/30/23 2122 12/30/23 1933     Does the patient have exclusion criteria? No- Perform mobility assessment No- Perform mobility assessment No- Perform mobility assessment No- Perform mobility assessment    What is the highest level of mobility based on the mobility assessment? Level 4 (Ambulates with assistance) - Balance while stepping forward/back - Complete Level 4 (Ambulates with assistance) - Balance while stepping forward/back - Complete Level 3 (Stands with assistance) - Balance while standing  and cannot march in place Level 3 (Stands with assistance) - Balance while standing  and cannot march in place    Is the above level different from baseline mobility prior to current illness? Yes -  Recommend PT order Yes - Recommend PT order Yes - Recommend PT order Yes - Recommend PT order       Diet: Diet Orders (From admission, onward)     Start     Ordered   12/29/23 0128  Diet Carb Modified Room service appropriate? Yes  Diet effective now       Question Answer Comment  Diet-HS Snack? Nothing   Calorie Level Medium 1600-2000   Fluid consistency: Thin   Room service appropriate? Yes      12/29/23 0131            Barriers to discharge: None Disposition Plan: Potentially SNF HH orders placed:  Status is: Inpatient  Objective: Blood pressure 116/82, pulse 77, temperature 98.3 F (36.8 C), temperature source Oral, resp. rate 17, height 5' 5 (1.651 m), weight 78.2 kg, SpO2 99%.  Examination:  Physical Exam Constitutional:      General: He is not in acute distress.    Comments: Slowed mentation and generalized confusion.  No distress  HENT:     Head: Normocephalic and atraumatic.     Mouth/Throat:     Mouth: Mucous membranes are moist.  Eyes:     Extraocular Movements: Extraocular movements intact.  Cardiovascular:     Rate and Rhythm: Normal rate and regular rhythm.  Pulmonary:  Effort: Pulmonary effort is normal. No respiratory distress.     Breath sounds: Normal breath sounds. No wheezing.  Abdominal:     General: Bowel sounds are normal. There is no distension.     Palpations: Abdomen is soft.     Tenderness: There is no abdominal tenderness.  Musculoskeletal:        General: Normal range of motion.     Cervical back: Normal range of motion and neck supple.  Skin:    General: Skin is warm and dry.  Neurological:     Mental Status: He is disoriented.     Motor: Weakness (Generalized.  Nonfocal) present.  Psychiatric:        Mood and Affect: Mood normal.        Behavior: Behavior normal.      Consultants:    Procedures:    Data Reviewed: Results for orders placed or performed during the hospital encounter of 12/28/23 (from the past 24  hours)  Glucose, capillary     Status: Abnormal   Collection Time: 01/01/24  8:55 PM  Result Value Ref Range   Glucose-Capillary 42 (LL) 70 - 99 mg/dL  Glucose, capillary     Status: Abnormal   Collection Time: 01/01/24  9:20 PM  Result Value Ref Range   Glucose-Capillary 57 (L) 70 - 99 mg/dL  Glucose, capillary     Status: Abnormal   Collection Time: 01/01/24  9:36 PM  Result Value Ref Range   Glucose-Capillary 67 (L) 70 - 99 mg/dL  Glucose, capillary     Status: Abnormal   Collection Time: 01/01/24 10:03 PM  Result Value Ref Range   Glucose-Capillary 117 (H) 70 - 99 mg/dL  Glucose, capillary     Status: Abnormal   Collection Time: 01/02/24  6:31 AM  Result Value Ref Range   Glucose-Capillary 173 (H) 70 - 99 mg/dL  Glucose, capillary     Status: Abnormal   Collection Time: 01/02/24  7:39 AM  Result Value Ref Range   Glucose-Capillary 195 (H) 70 - 99 mg/dL  Glucose, capillary     Status: Abnormal   Collection Time: 01/02/24 12:03 PM  Result Value Ref Range   Glucose-Capillary 226 (H) 70 - 99 mg/dL  Glucose, capillary     Status: None   Collection Time: 01/02/24  4:46 PM  Result Value Ref Range   Glucose-Capillary 88 70 - 99 mg/dL    I have reviewed pertinent nursing notes, vitals, labs, and images as necessary. I have ordered labwork to follow up on as indicated.  I have reviewed the last notes from staff over past 24 hours. I have discussed patient's care plan and test results with nursing staff, CM/SW, and other staff as appropriate.  Old records reviewed in assessment of this patient    LOS: 5 days   Alm Apo, MD [facility] 01/02/2024, 5:38 PM "

## 2024-01-02 NOTE — Plan of Care (Signed)

## 2024-01-02 NOTE — TOC Progression Note (Signed)
 Transition of Care [facility]) - Progression Note    Patient Details  Name: Steve Brown MRN: [id number] Date of Birth: [birth year]  Transition of Care [facility]) CM/SW Contact  Heather DELENA Saltness, LCSW Phone Number: 01/02/2024, 9:49 AM  Clinical Narrative:    CSW sent CLC checklist and referral packet to the [facility], via fax at [phone]. Fax was sent successfully at 9:23 AM. [name] to review for SNF placement. TOC will continue to follow.   Expected Discharge Plan: Skilled Nursing Facility Barriers to Discharge: Continued Medical Work up, SNF Pending bed offer   Expected Discharge Plan and Services In-house Referral: Clinical Social Work Discharge Planning Services: NA Post Acute Care Choice: Skilled Nursing Facility Living arrangements for the past 2 months: Apartment                 DME Arranged: N/A DME Agency: NA       HH Arranged: NA HH Agency: NA         Social Drivers of Health (SDOH) Interventions SDOH Screenings   Food Insecurity: No Food Insecurity (12/29/2023)  Housing: Low Risk (12/29/2023)  Transportation Needs: No Transportation Needs (12/29/2023)  Utilities: Not At Risk (12/29/2023)  Depression (PHQ2-9): Low Risk (12/14/2023)  Social Connections: Moderately Integrated (12/29/2023)  Tobacco Use: Low Risk (12/28/2023)    Readmission Risk Interventions    01/01/2024    2:26 PM 12/19/2023    3:48 PM  Readmission Risk Prevention Plan  Transportation Screening Complete Complete  Medication Review (RN Care Manager) Complete Referral to Pharmacy  PCP or Specialist appointment within 3-5 days of discharge Complete Complete  HRI or Home Care Consult Complete Complete  SW Recovery Care/Counseling Consult Complete Complete  Palliative Care Screening Not Applicable Complete  Skilled Nursing Facility Complete Not Applicable   Signed: Heather Saltness, MSW, LCSW Clinical Social Worker Inpatient Care Management 01/02/2024 9:51 AM

## 2024-01-03 DIAGNOSIS — R531 Weakness: Secondary | ICD-10-CM | POA: Diagnosis not present

## 2024-01-03 LAB — GLUCOSE, CAPILLARY
Glucose-Capillary: 177 mg/dL — ABNORMAL HIGH (ref 70–99)
Glucose-Capillary: 241 mg/dL — ABNORMAL HIGH (ref 70–99)
Glucose-Capillary: 98 mg/dL (ref 70–99)
Glucose-Capillary: 150 mg/dL — ABNORMAL HIGH (ref 70–99)

## 2024-01-03 MED ORDER — MELATONIN 5 MG PO TABS
5.0000 mg | ORAL_TABLET | Freq: Every evening | ORAL | Status: DC | PRN
  Administered 2024-01-04 – 2024-01-07 (×3): 5 mg via ORAL
  Filled 2024-01-03 (×2): qty 1

## 2024-01-03 NOTE — Progress Notes (Signed)
 " Progress Note    Leanord Thibeau   FMW:[id number]  DOB: 21-Jan-1940  DOA: 12/28/2023     6 PCP: [facility]  Initial CC: Fall, weakness, confusion  Hospital Course: Makena Mcgrady is a 83 y.o. male with medical history significant for dementia, A-fib on Eliquis , BPH, T2DM, GERD, CVA, CAD s/p stenting, DDD, prostate cancer, CKD 3B, anxiety, depression and a recent hospitalization for pneumonia and acute encephalopathy who presented to the ED via EMS for evaluation of altered mental status and generalized weakness.  According to EMS, patient lives alone in an apartment complex. The maintenance noticed patient was weak and altered so he called EMS.  According to daughter, patient has a history of not eating or taking his meds when he is in pain.  Reports that patient has not taken his meds or eating in the last 3 days. Patient reports worsening in his left shoulder pain and a headache but denies any trauma or injury to the shoulder.  He endorses feeling weak but denies any fevers, chills, shortness of breath, chest pain, cough, abdominal pain, nausea, vomiting or dysuria.   He is able to tell his full name, DOB, identify daughter, current location, current president but not the month or the year.   ED Course: Initial vitals show patient afebrile, but hypertensive with SBP in the 140s to 160s. Initial labs significant for glucose 417, BUN/creatinine 28/1.65, lactic acid 2.1-12.6, normal CBC, ethanol level, ammonia and UDS. CT head with no acute intracranial abnormality. CXR shows improved left lung base region with decreasing opacity. Pt received Tylenol , IV Rocephin , azithromycin , IVF bolus and started on insulin  drip. TRH was consulted for admission.    Assessment/Plan:   Generalized weakness and deconditioning - In the setting of acute illness but also contribution from dementia - PT/OT evals; likely in need of SNF and/or LTC - Fall precautions   Social determinants of  health Failure to thrive - Daughter is concern about patient's ability to care for self over the last few weeks - Family are looking for available resources for patient ([facility] patient) to assist with ADLs and medication - TOC consulted for assistance  Acute metabolic encephalopathy - resolved  - Patient with dementia who lives alone now presenting with altered mental status for the second time in less than 2 weeks - Patient quite weak appearing with slowed movement, oriented to self, person and place but not to time - Likely combination of severe hyperglycemia, dehydration, recent hospitalization, and some progression of his dementia - Delirium precautions  Dementia - Continued donepezil    Severe hyperglycemia Uncontrolled type 2 diabetes - Patient found to have glucose of 417 on admission AG of 16 but no acidosis - Findings due to patient not taking his insulin  over the last few days - S/p fluids -Continue Semglee  and SSI.  Modifying as needed - A1c 9.8 - Some hypoglycemia noted yesterday evening.  Will hold prandial coverage for now   CAP - resolved  - X-ray on admission showed improved opacity -Completed antibiotics after last hospitalization -Do not feel he needs ongoing antibiotics given treatment completed -Afebrile and no leukocytosis - Discontinue antibiotics  HTN - BP elevated with SBP in the 140s to 160s - Continue losartan    CKD 3B - Creatinine of 1.65 around recent baseline of 1.5-1.9 - Trend renal function and avoid nephrotoxic agents   Atrial fibrillation - Rate controlled - Continue Eliquis    Chronic left shoulder pain - Apply lidocaine  patch and continue as needed Norco  BPH - Continue finasteride  and Flomax    HLD - Continue rosuvastatin    Anxiety and depression - Continue bupropion , Lexapro  and Remeron   Interval History:  No events overnight.  Resting comfortably in bed.  Continues to have slowed mentation and generalized weakness. Otherwise  stable and awaiting rehab placement. [facility].   Antimicrobials:   DVT prophylaxis:   apixaban  (ELIQUIS ) tablet 5 mg   Code Status:   Code Status: Full Code  Mobility Assessment (Last 72 Hours)     Mobility Assessment     Row Name 01/03/24 1327 01/02/24 2042 01/02/24 0900 01/01/24 2213 01/01/24 1800   Does the patient have exclusion criteria? No- Perform mobility assessment No- Perform mobility assessment No- Perform mobility assessment No- Perform mobility assessment --   What is the highest level of mobility based on the mobility assessment? Level 4 (Ambulates with assistance) - Balance while stepping forward/back - Complete Level 4 (Ambulates with assistance) - Balance while stepping forward/back - Complete Level 4 (Ambulates with assistance) - Balance while stepping forward/back - Complete Level 4 (Ambulates with assistance) - Balance while stepping forward/back - Complete Level 4 (Ambulates with assistance) - Balance while stepping forward/back - Complete   Is the above level different from baseline mobility prior to current illness? Yes - Recommend PT order Yes - Recommend PT order Yes - Recommend PT order Yes - Recommend PT order --    Row Name 01/01/24 1214 01/01/24 0750 12/31/23 2027       Does the patient have exclusion criteria? -- No- Perform mobility assessment No- Perform mobility assessment     What is the highest level of mobility based on the mobility assessment? Level 4 (Ambulates with assistance) - Balance while stepping forward/back - Complete Level 4 (Ambulates with assistance) - Balance while stepping forward/back - Complete Level 4 (Ambulates with assistance) - Balance while stepping forward/back - Complete     Is the above level different from baseline mobility prior to current illness? -- Yes - Recommend PT order Yes - Recommend PT order        Diet: Diet Orders (From admission, onward)     Start     Ordered   12/29/23 0128  Diet Carb Modified Room  service appropriate? Yes  Diet effective now       Question Answer Comment  Diet-HS Snack? Nothing   Calorie Level Medium 1600-2000   Fluid consistency: Thin   Room service appropriate? Yes      12/29/23 0131            Barriers to discharge: None Disposition Plan: Potentially SNF HH orders placed:  Status is: Inpatient  Objective: Blood pressure 119/71, pulse 71, temperature 98.1 F (36.7 C), temperature source Oral, resp. rate 16, height 5' 5 (1.651 m), weight 78.2 kg, SpO2 100%.  Examination:  Physical Exam Constitutional:      General: He is not in acute distress.    Comments: Slowed mentation and generalized confusion.  No distress  HENT:     Head: Normocephalic and atraumatic.     Mouth/Throat:     Mouth: Mucous membranes are moist.  Eyes:     Extraocular Movements: Extraocular movements intact.  Cardiovascular:     Rate and Rhythm: Normal rate and regular rhythm.  Pulmonary:     Effort: Pulmonary effort is normal. No respiratory distress.     Breath sounds: Normal breath sounds. No wheezing.  Abdominal:     General: Bowel sounds are normal. There is no  distension.     Palpations: Abdomen is soft.     Tenderness: There is no abdominal tenderness.  Musculoskeletal:        General: Normal range of motion.     Cervical back: Normal range of motion and neck supple.  Skin:    General: Skin is warm and dry.  Neurological:     Mental Status: He is disoriented.     Motor: Weakness (Generalized.  Nonfocal) present.  Psychiatric:        Mood and Affect: Mood normal.        Behavior: Behavior normal.      Consultants:    Procedures:    Data Reviewed: Results for orders placed or performed during the hospital encounter of 12/28/23 (from the past 24 hours)  Glucose, capillary     Status: None   Collection Time: 01/02/24  4:46 PM  Result Value Ref Range   Glucose-Capillary 88 70 - 99 mg/dL  Glucose, capillary     Status: Abnormal   Collection Time:  01/02/24  8:08 PM  Result Value Ref Range   Glucose-Capillary 189 (H) 70 - 99 mg/dL  Glucose, capillary     Status: Abnormal   Collection Time: 01/03/24  7:24 AM  Result Value Ref Range   Glucose-Capillary 177 (H) 70 - 99 mg/dL  Glucose, capillary     Status: Abnormal   Collection Time: 01/03/24 11:40 AM  Result Value Ref Range   Glucose-Capillary 241 (H) 70 - 99 mg/dL    I have reviewed pertinent nursing notes, vitals, labs, and images as necessary. I have ordered labwork to follow up on as indicated.  I have reviewed the last notes from staff over past 24 hours. I have discussed patient's care plan and test results with nursing staff, CM/SW, and other staff as appropriate.  Old records reviewed in assessment of this patient    LOS: 6 days   Alm Apo, MD [facility] 01/03/2024, 3:31 PM "

## 2024-01-03 NOTE — Plan of Care (Signed)

## 2024-01-04 DIAGNOSIS — R531 Weakness: Secondary | ICD-10-CM | POA: Diagnosis not present

## 2024-01-04 LAB — GLUCOSE, CAPILLARY
Glucose-Capillary: 139 mg/dL — ABNORMAL HIGH (ref 70–99)
Glucose-Capillary: 193 mg/dL — ABNORMAL HIGH (ref 70–99)
Glucose-Capillary: 255 mg/dL — ABNORMAL HIGH (ref 70–99)
Glucose-Capillary: 110 mg/dL — ABNORMAL HIGH (ref 70–99)

## 2024-01-04 MED ORDER — TAMSULOSIN HCL 0.4 MG PO CAPS
0.4000 mg | ORAL_CAPSULE | Freq: Every day | ORAL | Status: DC
  Administered 2024-01-04 – 2024-01-09 (×6): 0.4 mg via ORAL
  Filled 2024-01-04 (×3): qty 1

## 2024-01-04 NOTE — Progress Notes (Signed)
 " Progress Note    Steve Brown   FMW:[id number]  DOB: 1940/06/02  DOA: 12/28/2023     7 PCP: [facility]  Initial CC: Fall, weakness, confusion  Hospital Course: Steve Brown is a 83 y.o. male with medical history significant for dementia, A-fib on Eliquis , BPH, T2DM, GERD, CVA, CAD s/p stenting, DDD, prostate cancer, CKD 3B, anxiety, depression and a recent hospitalization for pneumonia and acute encephalopathy who presented to the ED via EMS for evaluation of altered mental status and generalized weakness.  According to EMS, patient lives alone in an apartment complex. The maintenance noticed patient was weak and altered so he called EMS.  According to daughter, patient has a history of not eating or taking his meds when he is in pain.  Reports that patient has not taken his meds or eating in the last 3 days. Patient reports worsening in his left shoulder pain and a headache but denies any trauma or injury to the shoulder.  He endorses feeling weak but denies any fevers, chills, shortness of breath, chest pain, cough, abdominal pain, nausea, vomiting or dysuria.   He is able to tell his full name, DOB, identify daughter, current location, current president but not the month or the year.   ED Course: Initial vitals show patient afebrile, but hypertensive with SBP in the 140s to 160s. Initial labs significant for glucose 417, BUN/creatinine 28/1.65, lactic acid 2.1-12.6, normal CBC, ethanol level, ammonia and UDS. CT head with no acute intracranial abnormality. CXR shows improved left lung base region with decreasing opacity. Pt received Tylenol , IV Rocephin , azithromycin , IVF bolus and started on insulin  drip. TRH was consulted for admission.    Assessment/Plan:   Generalized weakness and deconditioning - In the setting of acute illness but also contribution from dementia - PT/OT evals; likely in need of SNF and/or LTC - Fall precautions   Social determinants of  health Failure to thrive - Daughter is concern about patient's ability to care for self over the last few weeks - Family are looking for available resources for patient ([facility] patient) to assist with ADLs and medication - TOC consulted for assistance  Acute metabolic encephalopathy - resolved  - Patient with dementia who lives alone now presenting with altered mental status for the second time in less than 2 weeks - Patient quite weak appearing with slowed movement, oriented to self, person and place but not to time - Likely combination of severe hyperglycemia, dehydration, recent hospitalization, and some progression of his dementia - Delirium precautions  Dementia - Continued donepezil    Severe hyperglycemia Uncontrolled type 2 diabetes - Patient found to have glucose of 417 on admission AG of 16 but no acidosis - Findings due to patient not taking his insulin  over the last few days - S/p fluids -Continue Semglee  and SSI.  Modifying as needed - A1c 9.8 - Some hypoglycemia noted yesterday evening.  Will hold prandial coverage for now   CAP - resolved  - X-ray on admission showed improved opacity -Completed antibiotics after last hospitalization -Do not feel he needs ongoing antibiotics given treatment completed -Afebrile and no leukocytosis - Discontinue antibiotics  HTN - BP elevated with SBP in the 140s to 160s - Continue losartan    CKD 3B - Creatinine of 1.65 around recent baseline of 1.5-1.9 - Trend renal function and avoid nephrotoxic agents   Atrial fibrillation - Rate controlled - Continue Eliquis    Chronic left shoulder pain - Apply lidocaine  patch and continue as needed Norco  BPH - Continue finasteride  and Flomax    HLD - Continue rosuvastatin    Anxiety and depression - Continue bupropion , Lexapro  and Remeron   Interval History:  No events overnight.  Resting comfortably in bed.  Continues to have slowed mentation and generalized weakness. Otherwise  stable and awaiting rehab placement. [facility] request [facility].   Antimicrobials:   DVT prophylaxis:   apixaban  (ELIQUIS ) tablet 5 mg   Code Status:   Code Status: Full Code  Mobility Assessment (Last 72 Hours)     Mobility Assessment     Row Name 01/04/24 1433 01/04/24 0733 01/03/24 2300 01/03/24 1327 01/02/24 2042   Does the patient have exclusion criteria? -- No- Perform mobility assessment No- Perform mobility assessment No- Perform mobility assessment No- Perform mobility assessment   What is the highest level of mobility based on the mobility assessment? Level 4 (Ambulates with assistance) - Balance while stepping forward/back - Complete Level 4 (Ambulates with assistance) - Balance while stepping forward/back - Complete Level 4 (Ambulates with assistance) - Balance while stepping forward/back - Complete Level 4 (Ambulates with assistance) - Balance while stepping forward/back - Complete Level 4 (Ambulates with assistance) - Balance while stepping forward/back - Complete   Is the above level different from baseline mobility prior to current illness? -- -- Yes - Recommend PT order Yes - Recommend PT order Yes - Recommend PT order    Row Name 01/02/24 0900 01/01/24 2213 01/01/24 1800       Does the patient have exclusion criteria? No- Perform mobility assessment No- Perform mobility assessment --     What is the highest level of mobility based on the mobility assessment? Level 4 (Ambulates with assistance) - Balance while stepping forward/back - Complete Level 4 (Ambulates with assistance) - Balance while stepping forward/back - Complete Level 4 (Ambulates with assistance) - Balance while stepping forward/back - Complete     Is the above level different from baseline mobility prior to current illness? Yes - Recommend PT order Yes - Recommend PT order --        Diet: Diet Orders (From admission, onward)     Start     Ordered   12/29/23 0128  Diet Carb Modified Room service appropriate? Yes   Diet effective now       Question Answer Comment  Diet-HS Snack? Nothing   Calorie Level Medium 1600-2000   Fluid consistency: Thin   Room service appropriate? Yes      12/29/23 0131            Barriers to discharge: None Disposition Plan: Potentially SNF HH orders placed:  Status is: Inpatient  Objective: Blood pressure 125/73, pulse 77, temperature 98.2 F (36.8 C), resp. rate 18, height 5' 5 (1.651 m), weight 78.2 kg, SpO2 97%.  Examination:  Physical Exam Constitutional:      General: He is not in acute distress.    Comments: Slowed mentation and generalized confusion.  No distress  HENT:     Head: Normocephalic and atraumatic.     Mouth/Throat:     Mouth: Mucous membranes are moist.  Eyes:     Extraocular Movements: Extraocular movements intact.  Cardiovascular:     Rate and Rhythm: Normal rate and regular rhythm.  Pulmonary:     Effort: Pulmonary effort is normal. No respiratory distress.     Breath sounds: Normal breath sounds. No wheezing.  Abdominal:     General: Bowel sounds are normal. There is no distension.     Palpations: Abdomen  is soft.     Tenderness: There is no abdominal tenderness.  Musculoskeletal:        General: Normal range of motion.     Cervical back: Normal range of motion and neck supple.  Skin:    General: Skin is warm and dry.  Neurological:     Mental Status: He is disoriented.     Motor: Weakness (Generalized.  Nonfocal) present.  Psychiatric:        Mood and Affect: Mood normal.        Behavior: Behavior normal.      Consultants:    Procedures:    Data Reviewed: Results for orders placed or performed during the hospital encounter of 12/28/23 (from the past 24 hours)  Glucose, capillary     Status: None   Collection Time: 01/03/24  6:16 PM  Result Value Ref Range   Glucose-Capillary 98 70 - 99 mg/dL   Comment 1 Notify RN   Glucose, capillary     Status: Abnormal   Collection Time: 01/03/24  8:40 PM  Result Value  Ref Range   Glucose-Capillary 150 (H) 70 - 99 mg/dL  Glucose, capillary     Status: Abnormal   Collection Time: 01/04/24  7:28 AM  Result Value Ref Range   Glucose-Capillary 139 (H) 70 - 99 mg/dL   Comment 1 Notify RN    Comment 2 Document in Chart   Glucose, capillary     Status: Abnormal   Collection Time: 01/04/24 11:58 AM  Result Value Ref Range   Glucose-Capillary 193 (H) 70 - 99 mg/dL   Comment 1 Notify RN    Comment 2 Document in Chart     I have reviewed pertinent nursing notes, vitals, labs, and images as necessary. I have ordered labwork to follow up on as indicated.  I have reviewed the last notes from staff over past 24 hours. I have discussed patient's care plan and test results with nursing staff, CM/SW, and other staff as appropriate.  Old records reviewed in assessment of this patient    LOS: 7 days   Alm Apo, MD [facility] 01/04/2024, 4:13 PM "

## 2024-01-04 NOTE — Plan of Care (Signed)

## 2024-01-04 NOTE — Progress Notes (Signed)
 Mobility Specialist Progress Note:   01/04/24 1408  Mobility  Activity Pivoted/transferred from chair to bed  Level of Assistance Minimal assist, patient does 75% or more  Assistive Device Front wheel walker  Distance Ambulated (ft) 2 ft  Activity Response Tolerated well  Mobility Referral Yes  Mobility visit 1 Mobility  Mobility Specialist Start Time (ACUTE ONLY) 1351  Mobility Specialist Stop Time (ACUTE ONLY) 1401  Mobility Specialist Time Calculation (min) (ACUTE ONLY) 10 min   Pt was received in recliner and requested to get back in bed. Min A sit to stand. Returned to bed with all needs met. Call bell in reach and bed alarm on.  [employer] - Mobility Specialist

## 2024-01-04 NOTE — Progress Notes (Signed)
 Physical Therapy Treatment Patient Details Name: Steve Brown MRN: [id number] DOB: 1940-04-27 Today's Date: 01/04/2024   History of Present Illness Patient is a 83 y.o. male admitted 12/28/2023 with altered mental status and generalized weakness. Recent hospitalization last week for pneumonia and acute encephalopathy. PMH: dementia, A-fib on Eliquis ,/ BPH, T2DM, GERD, CVA, CAD s/p stenting, DDD, prostate cancer, CKD 3B, anxiety, depression.    PT Comments  Pt is progressing well with mobility, he tolerated increased ambulation distance of 220' with RW, no loss of balance. Supervision for safety due to memory deficits recommended.     If plan is discharge home, recommend the following: Help with stairs or ramp for entrance;Assistance with cooking/housework;A little help with bathing/dressing/bathroom;Assist for transportation;A little help with walking and/or transfers   Can travel by private vehicle     Yes  Equipment Recommendations  None recommended by PT    Recommendations for Other Services       Precautions / Restrictions Precautions Precautions: Fall Recall of Precautions/Restrictions: Intact Restrictions Weight Bearing Restrictions Per Provider Order: No     Mobility  Bed Mobility Overal bed mobility: Needs Assistance Bed Mobility: Supine to Sit     Supine to sit: Min assist     General bed mobility comments: min A to raise trunk    Transfers Overall transfer level: Needs assistance Equipment used: Rolling walker (2 wheels) Transfers: Sit to/from Stand Sit to Stand: Contact guard assist           General transfer comment: VCs hand placement    Ambulation/Gait Ambulation/Gait assistance: Supervision Gait Distance (Feet): 220 Feet Assistive device: Rolling walker (2 wheels) Gait Pattern/deviations: Step-through pattern, Decreased stride length Gait velocity: decreased     General Gait Details: steady, no loss of balance   Stairs              Wheelchair Mobility     Tilt Bed    Modified Rankin (Stroke Patients Only)       Balance Overall balance assessment: Needs assistance Sitting-balance support: Feet supported Sitting balance-Leahy Scale: Good     Standing balance support: Bilateral upper extremity supported, During functional activity, Reliant on assistive device for balance Standing balance-Leahy Scale: Fair Standing balance comment: static standing no UE support                            Communication Communication Communication: No apparent difficulties  Cognition Arousal: Alert Behavior During Therapy: WFL for tasks assessed/performed   PT - Cognitive impairments: No family/caregiver present to determine baseline, Memory                           Following commands impaired: Only follows one step commands consistently    Cueing Cueing Techniques: Verbal cues  Exercises      General Comments        Pertinent Vitals/Pain Pain Assessment Faces Pain Scale: Hurts little more Pain Location: head and R shoulder Pain Descriptors / Indicators: Aching, Sore, Discomfort, Headache Pain Intervention(s): Limited activity within patient's tolerance, Monitored during session, Patient requesting pain meds-RN notified    Home Living                          Prior Function            PT Goals (current goals can now be found in the care plan section) Acute Rehab PT  Goals Patient Stated Goal: to get stronger PT Goal Formulation: With patient Time For Goal Achievement: 01/11/24 Potential to Achieve Goals: Good Progress towards PT goals: Progressing toward goals    Frequency    Min 2X/week      PT Plan      Co-evaluation              AM-PAC PT 6 Clicks Mobility   Outcome Measure  Help needed turning from your back to your side while in a flat bed without using bedrails?: A Little Help needed moving from lying on your back to sitting on the side  of a flat bed without using bedrails?: A Little Help needed moving to and from a bed to a chair (including a wheelchair)?: A Little Help needed standing up from a chair using your arms (e.g., wheelchair or bedside chair)?: A Little Help needed to walk in hospital room?: A Little Help needed climbing 3-5 steps with a railing? : A Little 6 Click Score: 18    End of Session Equipment Utilized During Treatment: Gait belt Activity Tolerance: Patient tolerated treatment well Patient left: in bed;with bed alarm set;with call bell/phone within reach Nurse Communication: Mobility status;Patient requests pain meds PT Visit Diagnosis: Muscle weakness (generalized) (M62.81);Unsteadiness on feet (R26.81);Other abnormalities of gait and mobility (R26.89)     Time: 1400-1415 PT Time Calculation (min) (ACUTE ONLY): 15 min  Charges:    $Gait Training: 8-22 mins PT General Charges $$ ACUTE PT VISIT: 1 Visit                     Sylvan Delon Copp PT 01/04/2024  Acute Rehabilitation Services  Office [phone]

## 2024-01-04 NOTE — Progress Notes (Signed)
 Mobility Specialist Progress Note:   01/04/24 1308  Mobility  Activity Pivoted/transferred from bed to chair  Level of Assistance Minimal assist, patient does 75% or more  Assistive Device Front wheel walker  Distance Ambulated (ft) 2 ft  Activity Response Tolerated well  Mobility Referral Yes  Mobility visit 1 Mobility  Mobility Specialist Start Time (ACUTE ONLY) 1152  Mobility Specialist Stop Time (ACUTE ONLY) 1208  Mobility Specialist Time Calculation (min) (ACUTE ONLY) 16 min   Pt was received in bed and agreed to mobility. Min A sit to stand. Returned to recliner with all needs met. Call bell in reach and chair alarm on.  [employer] - Mobility Specialist

## 2024-01-05 DIAGNOSIS — R531 Weakness: Secondary | ICD-10-CM | POA: Diagnosis not present

## 2024-01-05 LAB — GLUCOSE, CAPILLARY
Glucose-Capillary: 154 mg/dL — ABNORMAL HIGH (ref 70–99)
Glucose-Capillary: 125 mg/dL — ABNORMAL HIGH (ref 70–99)
Glucose-Capillary: 232 mg/dL — ABNORMAL HIGH (ref 70–99)
Glucose-Capillary: 229 mg/dL — ABNORMAL HIGH (ref 70–99)

## 2024-01-05 NOTE — Plan of Care (Signed)

## 2024-01-05 NOTE — Progress Notes (Signed)
 " Progress Note    Steve Brown   FMW:[id number]  DOB: 1940/09/04  DOA: 12/28/2023     8 PCP: [facility]  Initial CC: Fall, weakness, confusion  Hospital Course: Steve Brown is a 83 y.o. male with medical history significant for dementia, A-fib on Eliquis , BPH, T2DM, GERD, CVA, CAD s/p stenting, DDD, prostate cancer, CKD 3B, anxiety, depression and a recent hospitalization for pneumonia and acute encephalopathy who presented to the ED via EMS for evaluation of altered mental status and generalized weakness.  According to EMS, patient lives alone in an apartment complex. The maintenance noticed patient was weak and altered so he called EMS.  According to daughter, patient has a history of not eating or taking his meds when he is in pain.  Reports that patient has not taken his meds or eating in the last 3 days. Patient reports worsening in his left shoulder pain and a headache but denies any trauma or injury to the shoulder.  He endorses feeling weak but denies any fevers, chills, shortness of breath, chest pain, cough, abdominal pain, nausea, vomiting or dysuria.   He is able to tell his full name, DOB, identify daughter, current location, current president but not the month or the year.   ED Course: Initial vitals show patient afebrile, but hypertensive with SBP in the 140s to 160s. Initial labs significant for glucose 417, BUN/creatinine 28/1.65, lactic acid 2.1-12.6, normal CBC, ethanol level, ammonia and UDS. CT head with no acute intracranial abnormality. CXR shows improved left lung base region with decreasing opacity. Pt received Tylenol , IV Rocephin , azithromycin , IVF bolus and started on insulin  drip. TRH was consulted for admission.    Assessment/Plan:   Generalized weakness and deconditioning - In the setting of acute illness but also contribution from dementia - PT/OT evals; likely in need of SNF and/or LTC - Fall precautions   Social determinants of  health Failure to thrive - Daughter is concern about patient's ability to care for self over the last few weeks - Family are looking for available resources for patient ([facility]) to assist with ADLs and medication - TOC consulted for assistance  Acute metabolic encephalopathy - resolved  - Patient with dementia who lives alone now presenting with altered mental status for the second time in less than 2 weeks - Patient quite weak appearing with slowed movement, oriented to self, person and place but not to time - Likely combination of severe hyperglycemia, dehydration, recent hospitalization, and some progression of his dementia - Delirium precautions  Dementia - Continued donepezil    Severe hyperglycemia Uncontrolled type 2 diabetes - Patient found to have glucose of 417 on admission AG of 16 but no acidosis - Findings due to patient not taking his insulin  over the last few days - S/p fluids -Continue Semglee  and SSI.  Modifying as needed - A1c 9.8 - Some hypoglycemia noted yesterday evening.  Will hold prandial coverage for now   CAP - resolved  - X-ray on admission showed improved opacity -Completed antibiotics after last hospitalization -Do not feel he needs ongoing antibiotics given treatment completed -Afebrile and no leukocytosis - Discontinue antibiotics  HTN - BP elevated with SBP in the 140s to 160s - Continue losartan    CKD 3B - Creatinine of 1.65 around recent baseline of 1.5-1.9 - Trend renal function and avoid nephrotoxic agents   Atrial fibrillation - Rate controlled - Continue Eliquis    Chronic left shoulder pain - Apply lidocaine  patch and continue as needed Norco  BPH - Continue finasteride  and Flomax    HLD - Continue rosuvastatin    Anxiety and depression - Continue bupropion , Lexapro  and Remeron   Interval History:  No events overnight.  Resting comfortably in bed.  Continues to have slowed mentation and generalized weakness. Otherwise  stable and awaiting rehab placement. [facility].   Antimicrobials:   DVT prophylaxis:   apixaban  (ELIQUIS ) tablet 5 mg   Code Status:   Code Status: Full Code  Mobility Assessment (Last 72 Hours)     Mobility Assessment     Row Name 01/05/24 0820 01/04/24 2100 01/04/24 1433 01/04/24 0733 01/03/24 2300   Does the patient have exclusion criteria? No- Perform mobility assessment No- Perform mobility assessment -- No- Perform mobility assessment No- Perform mobility assessment   What is the highest level of mobility based on the mobility assessment? Level 4 (Ambulates with assistance) - Balance while stepping forward/back - Complete Level 4 (Ambulates with assistance) - Balance while stepping forward/back - Complete Level 4 (Ambulates with assistance) - Balance while stepping forward/back - Complete Level 4 (Ambulates with assistance) - Balance while stepping forward/back - Complete Level 4 (Ambulates with assistance) - Balance while stepping forward/back - Complete   Is the above level different from baseline mobility prior to current illness? -- No - Consider discontinuing PT/OT -- -- Yes - Recommend PT order    Row Name 01/03/24 1327 01/02/24 2042         Does the patient have exclusion criteria? No- Perform mobility assessment No- Perform mobility assessment      What is the highest level of mobility based on the mobility assessment? Level 4 (Ambulates with assistance) - Balance while stepping forward/back - Complete Level 4 (Ambulates with assistance) - Balance while stepping forward/back - Complete      Is the above level different from baseline mobility prior to current illness? Yes - Recommend PT order Yes - Recommend PT order         Diet: Diet Orders (From admission, onward)     Start     Ordered   12/29/23 0128  Diet Carb Modified Room service appropriate? Yes  Diet effective now       Question Answer Comment  Diet-HS Snack? Nothing   Calorie Level Medium 1600-2000    Fluid consistency: Thin   Room service appropriate? Yes      12/29/23 0131            Barriers to discharge: None Disposition Plan: Potentially SNF HH orders placed:  Status is: Inpatient  Objective: Blood pressure 119/73, pulse 69, temperature 98.9 F (37.2 C), resp. rate 17, height 5' 5 (1.651 m), weight 78.2 kg, SpO2 100%.  Examination:  Physical Exam Constitutional:      General: He is not in acute distress.    Comments: Slowed mentation and generalized confusion.  No distress  HENT:     Head: Normocephalic and atraumatic.     Mouth/Throat:     Mouth: Mucous membranes are moist.  Eyes:     Extraocular Movements: Extraocular movements intact.  Cardiovascular:     Rate and Rhythm: Normal rate and regular rhythm.  Pulmonary:     Effort: Pulmonary effort is normal. No respiratory distress.     Breath sounds: Normal breath sounds. No wheezing.  Abdominal:     General: Bowel sounds are normal. There is no distension.     Palpations: Abdomen is soft.     Tenderness: There is no abdominal tenderness.  Musculoskeletal:  General: Normal range of motion.     Cervical back: Normal range of motion and neck supple.  Skin:    General: Skin is warm and dry.  Neurological:     Mental Status: He is disoriented.     Motor: Weakness (Generalized.  Nonfocal) present.  Psychiatric:        Mood and Affect: Mood normal.        Behavior: Behavior normal.      Consultants:    Procedures:    Data Reviewed: Results for orders placed or performed during the hospital encounter of 12/28/23 (from the past 24 hours)  Glucose, capillary     Status: Abnormal   Collection Time: 01/04/24  4:24 PM  Result Value Ref Range   Glucose-Capillary 255 (H) 70 - 99 mg/dL   Comment 1 Notify RN    Comment 2 Document in Chart   Glucose, capillary     Status: Abnormal   Collection Time: 01/04/24  9:12 PM  Result Value Ref Range   Glucose-Capillary 110 (H) 70 - 99 mg/dL  Glucose,  capillary     Status: Abnormal   Collection Time: 01/05/24  8:06 AM  Result Value Ref Range   Glucose-Capillary 154 (H) 70 - 99 mg/dL  Glucose, capillary     Status: Abnormal   Collection Time: 01/05/24 12:17 PM  Result Value Ref Range   Glucose-Capillary 125 (H) 70 - 99 mg/dL    I have reviewed pertinent nursing notes, vitals, labs, and images as necessary. I have ordered labwork to follow up on as indicated.  I have reviewed the last notes from staff over past 24 hours. I have discussed patient's care plan and test results with nursing staff, CM/SW, and other staff as appropriate.  Old records reviewed in assessment of this patient    LOS: 8 days   Alm Apo, MD [facility] 01/05/2024, 2:27 PM "

## 2024-01-06 DIAGNOSIS — R531 Weakness: Secondary | ICD-10-CM | POA: Diagnosis not present

## 2024-01-06 LAB — GLUCOSE, CAPILLARY
Glucose-Capillary: 188 mg/dL — ABNORMAL HIGH (ref 70–99)
Glucose-Capillary: 356 mg/dL — ABNORMAL HIGH (ref 70–99)
Glucose-Capillary: 203 mg/dL — ABNORMAL HIGH (ref 70–99)
Glucose-Capillary: 143 mg/dL — ABNORMAL HIGH (ref 70–99)

## 2024-01-06 MED ORDER — INSULIN GLARGINE-YFGN 100 UNIT/ML ~~LOC~~ SOLN
10.0000 [IU] | Freq: Every day | SUBCUTANEOUS | Status: DC
  Administered 2024-01-07 – 2024-01-10 (×4): 10 [IU] via SUBCUTANEOUS

## 2024-01-06 NOTE — Progress Notes (Signed)
 " Progress Note    Steve Brown   FMW:[id number]  DOB: 1940/10/01  DOA: 12/28/2023     9 PCP: [facility]  Initial CC: Fall, weakness, confusion  Hospital Course: Steve Brown is a 83 y.o. male with medical history significant for dementia, A-fib on Eliquis , BPH, T2DM, GERD, CVA, CAD s/p stenting, DDD, prostate cancer, CKD 3B, anxiety, depression and a recent hospitalization for pneumonia and acute encephalopathy who presented to the ED via EMS for evaluation of altered mental status and generalized weakness.  According to EMS, patient lives alone in an apartment complex. The maintenance man noticed patient was weak and altered so he called EMS.  According to daughter, patient has a history of not eating or taking his meds when he is in pain.  Reports that patient has not taken his meds or eating in the last 3 days. Patient reports worsening in his left shoulder pain and a headache but denies any trauma or injury to the shoulder.  He endorses feeling weak but denies any fevers, chills, shortness of breath, chest pain, cough, abdominal pain, nausea, vomiting or dysuria.   He is able to tell his full name, DOB, identify daughter, current location, current president but not the month or the year.   ED Course: Initial vitals show patient afebrile, but hypertensive with SBP in the 140s to 160s. Initial labs significant for glucose 417, BUN/creatinine 28/1.65, lactic acid 2.1-12.6, normal CBC, ethanol level, ammonia and UDS. CT head with no acute intracranial abnormality. CXR shows improved left lung base region with decreasing opacity. Pt received Tylenol , IV Rocephin , azithromycin , IVF bolus and started on insulin  drip. TRH was consulted for admission.    Assessment/Plan:   Generalized weakness and deconditioning - In the setting of acute illness but also contribution from dementia - PT/OT evals; primarily recommendations focus on assistance with ADLs IADLs in context of  dementia-likely in need of SNF and/or LTC - Fall precautions   Social determinants of health Failure to thrive - Daughter is concern about patient's ability to care for self over the last few weeks - Family are looking for available resources for patient ([facility]) to assist with ADLs and medication - TOC consulted for assistance  Acute metabolic encephalopathy - resolved  Underlying dementia - Patient with dementia who lives alone now presenting with altered mental status for the second time in less than 2 weeks - Patient quite weak appearing with slowed movement, oriented to self, person and place but not to time -Continue preadmission donepezil  - Likely combination of severe hyperglycemia, dehydration, recent hospitalization, and some progression of his dementia - Delirium precautions   Severe hyperglycemia Uncontrolled type 2 diabetes - Patient found to have glucose of 417 on admission AG of 16 but no acidosis - Findings due to patient not taking his insulin  over the last few days in context of his dementia/memory loss - S/p fluids -Continue low-dose Semglee  7 units daily and sensitive SSI.  Modifying as needed - A1c 9.8 - CBGs over the past 24 hours have ranged between 188 and 356 -Will increase a.m. dose of Semglee  to 10 units-high CBGs have been postprandial with the highest 356 after lunch on 12/28   CAP - resolved  - X-ray on admission showed improved opacity -Completed antibiotics after last hospitalization -Do not feel he needs ongoing antibiotics given treatment completed -Afebrile and no leukocytosis  HTN - BP elevated with SBP in the 140s to 160s - Continue losartan    CKD 3B -  Creatinine of 1.65 around recent baseline of 1.5-1.9 - Trend renal function and avoid nephrotoxic agents   Atrial fibrillation - Rate controlled - Continue Eliquis    Chronic left shoulder pain - Apply lidocaine  patch and continue as needed Norco -MRI left shoulder November 2025  revealed mild supraspinatus and subscap Ilaris tendinopathy with moderate tendinopathy of the intra-articular segment of the long head of the biceps.  There was also moderate degenerative glenohumeral arthropathy and mild degenerative AC joint spurring with associated diffuse moderate regional muscular atrophy. -PT consult to focus on left arm and shoulder issues/strength   BPH - Continue finasteride  and Flomax    HLD - Continue rosuvastatin    Anxiety and depression - Continue bupropion , Lexapro  and Remeron   History of genital herpes Clarified that valacyclovir  is as needed based on recurrence of lesions-Will discontinue valacyclovir  for now  Interval History:  No events overnight.  Resting comfortably in bed.  Reporting left shoulder pain which is chronic Otherwise stable and awaiting rehab placement. [facility].   Antimicrobials: 1 dose of Zithromax  in the ED 1 dose of ceftriaxone    DVT prophylaxis:   apixaban  (ELIQUIS ) tablet 5 mg   Code Status:   Code Status: Full Code  Mobility Assessment (Last 72 Hours)     Mobility Assessment     Row Name 01/06/24 0000 01/05/24 0820 01/04/24 2100 01/04/24 1433 01/04/24 0733   Does the patient have exclusion criteria? No- Perform mobility assessment No- Perform mobility assessment No- Perform mobility assessment -- No- Perform mobility assessment   What is the highest level of mobility based on the mobility assessment? Level 4 (Ambulates with assistance) - Balance while stepping forward/back - Complete Level 4 (Ambulates with assistance) - Balance while stepping forward/back - Complete Level 4 (Ambulates with assistance) - Balance while stepping forward/back - Complete Level 4 (Ambulates with assistance) - Balance while stepping forward/back - Complete Level 4 (Ambulates with assistance) - Balance while stepping forward/back - Complete   Is the above level different from baseline mobility prior to current illness? -- -- No - Consider  discontinuing PT/OT -- --    Row Name 01/03/24 2300 01/03/24 1327         Does the patient have exclusion criteria? No- Perform mobility assessment No- Perform mobility assessment      What is the highest level of mobility based on the mobility assessment? Level 4 (Ambulates with assistance) - Balance while stepping forward/back - Complete Level 4 (Ambulates with assistance) - Balance while stepping forward/back - Complete      Is the above level different from baseline mobility prior to current illness? Yes - Recommend PT order Yes - Recommend PT order         Diet: Diet Orders (From admission, onward)     Start     Ordered   12/29/23 0128  Diet Carb Modified Room service appropriate? Yes  Diet effective now       Question Answer Comment  Diet-HS Snack? Nothing   Calorie Level Medium 1600-2000   Fluid consistency: Thin   Room service appropriate? Yes      12/29/23 0131            Barriers to discharge: None Disposition Plan: Potentially SNF HH orders placed:  Status is: Inpatient  Objective: Blood pressure 130/75, pulse 70, temperature 98.6 F (37 C), temperature source Oral, resp. rate 18, height 5' 5 (1.651 m), weight 78.2 kg, SpO2 97%.   Examination:  Constitutional: NAD, calm, comfortable Respiratory: clear to  auscultation bilaterally, no wheezing, no crackles. Normal respiratory effort. No accessory muscle use.  Cardiovascular: Regular rate and rhythm, no murmurs / rubs / gallops. No extremity edema. 2+ pedal pulses.   Abdomen: no tenderness, no masses palpated. No hepatosplenomegaly. Bowel sounds positive.  Musculoskeletal: no clubbing / cyanosis. No joint deformity upper and lower extremities.  Decreased range of motion left shoulder in context of pain., no contractures. Normal muscle tone.  Skin: no rashes, lesions, ulcers. No induration Neurologic: CN 2-12 grossly intact. Sensation intact, Strength 5/5 x all 4 extremities.  Psychiatric: Normal judgment and  insight. Alert and oriented x name only. Normal mood.     Consultants:  None  Procedures:  None  Data Reviewed: Results for orders placed or performed during the hospital encounter of 12/28/23 (from the past 24 hours)  Glucose, capillary     Status: Abnormal   Collection Time: 01/05/24  8:06 AM  Result Value Ref Range   Glucose-Capillary 154 (H) 70 - 99 mg/dL  Glucose, capillary     Status: Abnormal   Collection Time: 01/05/24 12:17 PM  Result Value Ref Range   Glucose-Capillary 125 (H) 70 - 99 mg/dL  Glucose, capillary     Status: Abnormal   Collection Time: 01/05/24  5:00 PM  Result Value Ref Range   Glucose-Capillary 232 (H) 70 - 99 mg/dL  Glucose, capillary     Status: Abnormal   Collection Time: 01/05/24  9:08 PM  Result Value Ref Range   Glucose-Capillary 229 (H) 70 - 99 mg/dL  Glucose, capillary     Status: Abnormal   Collection Time: 01/06/24  7:42 AM  Result Value Ref Range   Glucose-Capillary 188 (H) 70 - 99 mg/dL   Comment 1 Notify RN     I have reviewed pertinent nursing notes, vitals, labs, and images as necessary. I have ordered labwork to follow up on as indicated.  I have reviewed the last notes from staff over past 24 hours. I have discussed patient's care plan and test results with nursing staff, CM/SW, and other staff as appropriate.  Old records reviewed in assessment of this patient    LOS: 9 days   Isaiah Lever, MD [facility] 01/06/2024, 8:02 AM "

## 2024-01-06 NOTE — Plan of Care (Signed)

## 2024-01-06 NOTE — Plan of Care (Signed)
  Problem: Education: Goal: Knowledge of General Education information will improve Description: Including pain rating scale, medication(s)/side effects and non-pharmacologic comfort measures Outcome: Not Progressing   Problem: Health Behavior/Discharge Planning: Goal: Ability to manage health-related needs will improve Outcome: Not Progressing   Problem: Clinical Measurements: Goal: Ability to maintain clinical measurements within normal limits will improve Outcome: Progressing Goal: Will remain free from infection Outcome: Progressing Goal: Diagnostic test results will improve Outcome: Progressing Goal: Respiratory complications will improve Outcome: Progressing Goal: Cardiovascular complication will be avoided Outcome: Progressing   Problem: Activity: Goal: Risk for activity intolerance will decrease Outcome: Progressing

## 2024-01-07 DIAGNOSIS — R531 Weakness: Secondary | ICD-10-CM | POA: Diagnosis not present

## 2024-01-07 LAB — CBC
HCT: 33 % — ABNORMAL LOW (ref 39.0–52.0)
Hemoglobin: 11.3 g/dL — ABNORMAL LOW (ref 13.0–17.0)
MCH: 32.1 pg (ref 26.0–34.0)
MCHC: 34.2 g/dL (ref 30.0–36.0)
MCV: 93.8 fL (ref 80.0–100.0)
Platelets: 165 K/uL (ref 150–400)
RBC: 3.52 MIL/uL — ABNORMAL LOW (ref 4.22–5.81)
RDW: 13.2 % (ref 11.5–15.5)
WBC: 5.2 K/uL (ref 4.0–10.5)
nRBC: 0 % (ref 0.0–0.2)

## 2024-01-07 LAB — COMPREHENSIVE METABOLIC PANEL WITH GFR
ALT: 14 U/L (ref 0–44)
AST: 21 U/L (ref 15–41)
Albumin: 3.2 g/dL — ABNORMAL LOW (ref 3.5–5.0)
Alkaline Phosphatase: 61 U/L (ref 38–126)
Anion gap: 12 (ref 5–15)
BUN: 26 mg/dL — ABNORMAL HIGH (ref 8–23)
CO2: 26 mmol/L (ref 22–32)
Calcium: 8.7 mg/dL — ABNORMAL LOW (ref 8.9–10.3)
Chloride: 102 mmol/L (ref 98–111)
Creatinine, Ser: 1.86 mg/dL — ABNORMAL HIGH (ref 0.61–1.24)
GFR, Estimated: 36 mL/min — ABNORMAL LOW
Glucose, Bld: 190 mg/dL — ABNORMAL HIGH (ref 70–99)
Potassium: 4.3 mmol/L (ref 3.5–5.1)
Sodium: 139 mmol/L (ref 135–145)
Total Bilirubin: 0.5 mg/dL (ref 0.0–1.2)
Total Protein: 5.8 g/dL — ABNORMAL LOW (ref 6.5–8.1)

## 2024-01-07 LAB — GLUCOSE, CAPILLARY
Glucose-Capillary: 203 mg/dL — ABNORMAL HIGH (ref 70–99)
Glucose-Capillary: 240 mg/dL — ABNORMAL HIGH (ref 70–99)
Glucose-Capillary: 265 mg/dL — ABNORMAL HIGH (ref 70–99)
Glucose-Capillary: 267 mg/dL — ABNORMAL HIGH (ref 70–99)

## 2024-01-07 NOTE — Progress Notes (Signed)
 " Progress Note    Steve Brown   FMW:[id number]  DOB: 05/15/1940  DOA: 12/28/2023     10 PCP: [facility]  Initial CC: Fall, weakness, confusion  Hospital Course: Steve Brown is a 83 y.o. male with medical history significant for dementia, A-fib on Eliquis , BPH, T2DM, GERD, CVA, CAD s/p stenting, DDD, prostate cancer, CKD 3B, anxiety, depression and a recent hospitalization for pneumonia and acute encephalopathy who presented to the ED via EMS for evaluation of altered mental status and generalized weakness.  According to EMS, patient lives alone in an apartment complex. The maintenance man noticed patient was weak and altered so he called EMS.  According to daughter, patient has a history of not eating or taking his meds when he is in pain.  Reports that patient has not taken his meds or eating in the last 3 days. Patient reports worsening in his left shoulder pain and a headache but denies any trauma or injury to the shoulder.  He endorses feeling weak but denies any fevers, chills, shortness of breath, chest pain, cough, abdominal pain, nausea, vomiting or dysuria.   He is able to tell his full name, DOB, identify daughter, current location, current president but not the month or the year.   ED Course: Initial vitals show patient afebrile, but hypertensive with SBP in the 140s to 160s. Initial labs significant for glucose 417, BUN/creatinine 28/1.65, lactic acid 2.1-12.6, normal CBC, ethanol level, ammonia and UDS. CT head with no acute intracranial abnormality. CXR shows improved left lung base region with decreasing opacity. Pt received Tylenol , IV Rocephin , azithromycin , IVF bolus and started on insulin  drip. TRH was consulted for admission.    Assessment/Plan:   Generalized weakness and deconditioning - In the setting of acute illness but also contribution from dementia - PT/OT evals; primarily recommendations focus on assistance with ADLs IADLs in context of  dementia-likely in need of SNF and/or LTC - Fall precautions   Social determinants of health Failure to thrive - Daughter is concern about patient's ability to care for self over the last few weeks - Family are looking for available resources for patient ([facility]) to assist with ADLs and medication - TOC consulted for assistance  Acute metabolic encephalopathy - resolved  Underlying dementia - Patient with dementia who lives alone now presenting with altered mental status for the second time in less than 2 weeks - Patient quite weak appearing with slowed movement, oriented to self, person and place but not to time -Continue preadmission donepezil  - Likely combination of severe hyperglycemia, dehydration, recent hospitalization, and some progression of his dementia - Delirium precautions   Severe hyperglycemia Uncontrolled type 2 diabetes - Patient found to have glucose of 417 on admission AG of 16 but no acidosis - Findings due to patient not taking his insulin  over the last few days in context of his dementia/memory loss - S/p fluids -Continue low-dose Semglee  7 units daily and sensitive SSI.  Modifying as needed - A1c 9.8 - CBGs over the past 24 hours have ranged between 188 and 356 -Will increase a.m. dose of Semglee  to 10 units-high CBGs have been postprandial with the highest 356 after lunch on 12/28   CAP - resolved  - X-ray on admission showed improved opacity -Completed antibiotics after last hospitalization -Do not feel he needs ongoing antibiotics given treatment completed -Afebrile and no leukocytosis  HTN - BP elevated with SBP in the 140s to 160s - Continue losartan    CKD 3B -  Creatinine of 1.65 around recent baseline of 1.5-1.9 - Trend renal function and avoid nephrotoxic agents   Atrial fibrillation - Rate controlled - Continue Eliquis    Chronic left shoulder pain - Apply lidocaine  patch and continue as needed Norco -MRI left shoulder November 2025  revealed mild supraspinatus and subscap Ilaris tendinopathy with moderate tendinopathy of the intra-articular segment of the long head of the biceps.  There was also moderate degenerative glenohumeral arthropathy and mild degenerative AC joint spurring with associated diffuse moderate regional muscular atrophy. -PT consult to focus on left arm and shoulder issues/strength   BPH - Continue finasteride  and Flomax    HLD - Continue rosuvastatin    Anxiety and depression - Continue bupropion , Lexapro  and Remeron   History of genital herpes Clarified that valacyclovir  is as needed based on recurrence of lesions-Will discontinue valacyclovir  for now  Interval History:  No events overnight.  Patient was seen sitting up in a chair eating breakfast.  He has no acute concerns.  He is awaiting placement at the [facility].    Antimicrobials: 1 dose of Zithromax  in the ED 1 dose of ceftriaxone    DVT prophylaxis:   apixaban  (ELIQUIS ) tablet 5 mg   Code Status:   Code Status: Full Code  Mobility Assessment (Last 72 Hours)     Mobility Assessment     Row Name 01/06/24 2112 01/06/24 0838 01/06/24 0000 01/05/24 0820 01/04/24 2100   Does the patient have exclusion criteria? No- Perform mobility assessment No- Perform mobility assessment No- Perform mobility assessment No- Perform mobility assessment No- Perform mobility assessment   What is the highest level of mobility based on the mobility assessment? Level 3 (Stands with assistance) - Balance while standing  and cannot march in place Level 4 (Ambulates with assistance) - Balance while stepping forward/back - Complete Level 4 (Ambulates with assistance) - Balance while stepping forward/back - Complete Level 4 (Ambulates with assistance) - Balance while stepping forward/back - Complete Level 4 (Ambulates with assistance) - Balance while stepping forward/back - Complete   Is the above level different from baseline mobility prior to current illness? Yes -  Recommend PT order -- -- -- No - Consider discontinuing PT/OT    Row Name 01/04/24 1433           What is the highest level of mobility based on the mobility assessment? Level 4 (Ambulates with assistance) - Balance while stepping forward/back - Complete          Diet: Diet Orders (From admission, onward)     Start     Ordered   12/29/23 0128  Diet Carb Modified Room service appropriate? Yes  Diet effective now       Question Answer Comment  Diet-HS Snack? Nothing   Calorie Level Medium 1600-2000   Fluid consistency: Thin   Room service appropriate? Yes      12/29/23 0131            Barriers to discharge: None Disposition Plan: Potentially SNF HH orders placed:  Status is: Inpatient  Objective: Blood pressure (!) 143/82, pulse 70, temperature 98.2 F (36.8 C), resp. rate 18, height 5' 5 (1.651 m), weight 78.2 kg, SpO2 98%.   Examination:  Constitutional: NAD, calm, comfortable Respiratory: clear to auscultation bilaterally, no wheezing, no crackles. Normal respiratory effort. No accessory muscle use.  Cardiovascular: Regular rate and rhythm, no murmurs / rubs / gallops. No extremity edema. 2+ pedal pulses.   Abdomen: no tenderness, no masses palpated. No hepatosplenomegaly. Bowel sounds positive.  Musculoskeletal: no clubbing / cyanosis. No joint deformity upper and lower extremities.  Decreased range of motion left shoulder in context of pain., no contractures. Normal muscle tone.  Skin: no rashes, lesions, ulcers. No induration Neurologic: CN 2-12 grossly intact. Sensation intact, Strength 5/5 x all 4 extremities.  Psychiatric: Normal judgment and insight. Alert and oriented x name only. Normal mood.     Consultants:  None  Procedures:  None  Data Reviewed: Results for orders placed or performed during the hospital encounter of 12/28/23 (from the past 24 hours)  Glucose, capillary     Status: Abnormal   Collection Time: 01/06/24 11:48 AM  Result Value Ref  Range   Glucose-Capillary 356 (H) 70 - 99 mg/dL   Comment 1 Notify RN   Glucose, capillary     Status: Abnormal   Collection Time: 01/06/24  4:46 PM  Result Value Ref Range   Glucose-Capillary 203 (H) 70 - 99 mg/dL   Comment 1 Notify RN   Glucose, capillary     Status: Abnormal   Collection Time: 01/06/24  9:39 PM  Result Value Ref Range   Glucose-Capillary 143 (H) 70 - 99 mg/dL  CBC     Status: Abnormal   Collection Time: 01/07/24  5:37 AM  Result Value Ref Range   WBC 5.2 4.0 - 10.5 K/uL   RBC 3.52 (L) 4.22 - 5.81 MIL/uL   Hemoglobin 11.3 (L) 13.0 - 17.0 g/dL   HCT 66.9 (L) 60.9 - 47.9 %   MCV 93.8 80.0 - 100.0 fL   MCH 32.1 26.0 - 34.0 pg   MCHC 34.2 30.0 - 36.0 g/dL   RDW 86.7 88.4 - 84.4 %   Platelets 165 150 - 400 K/uL   nRBC 0.0 0.0 - 0.2 %  Comprehensive metabolic panel with GFR     Status: Abnormal   Collection Time: 01/07/24  5:37 AM  Result Value Ref Range   Sodium 139 135 - 145 mmol/L   Potassium 4.3 3.5 - 5.1 mmol/L   Chloride 102 98 - 111 mmol/L   CO2 26 22 - 32 mmol/L   Glucose, Bld 190 (H) 70 - 99 mg/dL   BUN 26 (H) 8 - 23 mg/dL   Creatinine, Ser 8.13 (H) 0.61 - 1.24 mg/dL   Calcium  8.7 (L) 8.9 - 10.3 mg/dL   Total Protein 5.8 (L) 6.5 - 8.1 g/dL   Albumin  3.2 (L) 3.5 - 5.0 g/dL   AST 21 15 - 41 U/L   ALT 14 0 - 44 U/L   Alkaline Phosphatase 61 38 - 126 U/L   Total Bilirubin 0.5 0.0 - 1.2 mg/dL   GFR, Estimated 36 (L) >60 mL/min   Anion gap 12 5 - 15  Glucose, capillary     Status: Abnormal   Collection Time: 01/07/24  8:14 AM  Result Value Ref Range   Glucose-Capillary 203 (H) 70 - 99 mg/dL    I have reviewed pertinent nursing notes, vitals, labs, and images as necessary. I have ordered labwork to follow up on as indicated.  I have reviewed the last notes from staff over past 24 hours. I have discussed patient's care plan and test results with nursing staff, CM/SW, and other staff as appropriate.  Old records reviewed in assessment of this  patient    LOS: 10 days   Landon FORBES Baller, MD [facility] 01/07/2024, 8:42 AM "

## 2024-01-07 NOTE — Plan of Care (Signed)
" °  Problem: Activity: Goal: Risk for activity intolerance will decrease Outcome: Progressing   Problem: Elimination: Goal: Will not experience complications related to urinary retention Outcome: Progressing   Problem: Pain Managment: Goal: General experience of comfort will improve and/or be controlled Outcome: Progressing   Problem: Safety: Goal: Ability to remain free from injury will improve Outcome: Progressing   Problem: Skin Integrity: Goal: Risk for impaired skin integrity will decrease Outcome: Progressing   Problem: Skin Integrity: Goal: Risk for impaired skin integrity will decrease Outcome: Progressing   "

## 2024-01-07 NOTE — TOC Progression Note (Signed)
 Transition of Care [facility]) - Progression Note    Patient Details  Name: Steve Brown MRN: [id number] Date of Birth: June 15, 1940  [facility] CM/SW Contact  Heather DELENA Saltness, LCSW Phone Number: 01/07/2024, 11:36 AM  Clinical Narrative:    11:34 AM - CSW attempted to speak with [name] [phone]. [phone], Screening/Admissions coordinator, at [facility], via phone call to discuss insurance authorization for SNF rehab. No answer, voicemail left requesting return phone call. TOC will continue to follow.   Expected Discharge Plan: Skilled Nursing Facility Barriers to Discharge: Continued Medical Work up, SNF Pending bed offer   Expected Discharge Plan and Services In-house Referral: Clinical Social Work Discharge Planning Services: NA Post Acute Care Choice: Skilled Nursing Facility Living arrangements for the past 2 months: Apartment                 DME Arranged: N/A DME Agency: NA       HH Arranged: NA HH Agency: NA         Social Drivers of Health (SDOH) Interventions SDOH Screenings   Food Insecurity: No Food Insecurity (12/29/2023)  Housing: Low Risk (12/29/2023)  Transportation Needs: No Transportation Needs (12/29/2023)  Utilities: Not At Risk (12/29/2023)  Depression (PHQ2-9): Low Risk (12/14/2023)  Social Connections: Moderately Integrated (12/29/2023)  Tobacco Use: Low Risk (12/28/2023)    Readmission Risk Interventions    01/01/2024    2:26 PM 12/19/2023    3:48 PM  Readmission Risk Prevention Plan  Transportation Screening Complete Complete  Medication Review (RN Care Manager) Complete Referral to Pharmacy  PCP or Specialist appointment within 3-5 days of discharge Complete Complete  HRI or Home Care Consult Complete Complete  SW Recovery Care/Counseling Consult Complete Complete  Palliative Care Screening Not Applicable Complete  Skilled Nursing Facility Complete Not Applicable    Signed: Heather Saltness, MSW,  LCSW Clinical Social Worker Inpatient Care Management 01/07/2024 11:38 AM

## 2024-01-07 NOTE — Plan of Care (Signed)
  Problem: Education: Goal: Knowledge of General Education information will improve Description: Including pain rating scale, medication(s)/side effects and non-pharmacologic comfort measures Outcome: Progressing   Problem: Nutrition: Goal: Adequate nutrition will be maintained Outcome: Progressing   Problem: Elimination: Goal: Will not experience complications related to bowel motility Outcome: Progressing Goal: Will not experience complications related to urinary retention Outcome: Progressing   Problem: Pain Managment: Goal: General experience of comfort will improve and/or be controlled Outcome: Progressing   Problem: Safety: Goal: Ability to remain free from injury will improve Outcome: Progressing

## 2024-01-07 NOTE — Progress Notes (Signed)
 Physical Therapy Treatment Patient Details Name: Steve Brown MRN: [id number] DOB: 09-17-1940 Today's Date: 01/07/2024   History of Present Illness Patient is a 83 y.o. male admitted 12/28/2023 with altered mental status and generalized weakness. Recent hospitalization last week for pneumonia and acute encephalopathy. PMH: dementia, A-fib on Eliquis ,/ BPH, T2DM, GERD, CVA, CAD s/p stenting, DDD, prostate cancer, CKD 3B, anxiety, depression.    PT Comments  Pt pleasant, agreeable to therapy. Pt needing min A to mobilize to bedside. Pt powers to stand with multimodal cues for hand placement, powers up from bedside with BUE assisting and BLE braced against bedside. Pt amb with RW in hallway, CGA, easily distracted needing redirection to task, significantly decreased cadence with short steps and minimal bil foot clearance, slows further or stops with external distractions. Continue to recommend 24/7 supv due to cognition and safety concerns.   If plan is discharge home, recommend the following: A little help with walking and/or transfers;A little help with bathing/dressing/bathroom;Assistance with cooking/housework;Assist for transportation;Supervision due to cognitive status;Help with stairs or ramp for entrance   Can travel by private vehicle     Yes  Equipment Recommendations  None recommended by PT    Recommendations for Other Services       Precautions / Restrictions Precautions Precautions: Fall Recall of Precautions/Restrictions: Intact Restrictions Weight Bearing Restrictions Per Provider Order: No     Mobility  Bed Mobility Overal bed mobility: Needs Assistance Bed Mobility: Supine to Sit     Supine to sit: Min assist, Used rails, HOB elevated     General bed mobility comments: min A to raise trunk from sitting and mobilize to bedside, cues for inititation    Transfers Overall transfer level: Needs assistance Equipment used: Rolling walker (2 wheels) Transfers:  Sit to/from Stand Sit to Stand: Contact guard assist           General transfer comment: multimodal cues for hand placement to power to stand, BLE braced against bed    Ambulation/Gait Ambulation/Gait assistance: Contact guard assist Gait Distance (Feet): 200 Feet Assistive device: Rolling walker (2 wheels) Gait Pattern/deviations: Decreased step length - right, Decreased step length - left, Decreased stride length Gait velocity: decreased     General Gait Details: short bil steps, minimal bil foot clearance to near shuffling step progression, significantly decreased cadence, with visual distractions in hallway pt decreases cadence or stops, cues for maintaining body within RW frame, increased time and steps with turns   Comptroller Bed    Modified Rankin (Stroke Patients Only)       Balance Overall balance assessment: Needs assistance Sitting-balance support: Feet supported, Bilateral upper extremity supported Sitting balance-Leahy Scale: Fair Sitting balance - Comments: not challenged   Standing balance support: During functional activity, Bilateral upper extremity supported, Reliant on assistive device for balance Standing balance-Leahy Scale: Poor                              Communication Communication Communication: No apparent difficulties  Cognition Arousal: Alert Behavior During Therapy: WFL for tasks assessed/performed   PT - Cognitive impairments: No family/caregiver present to determine baseline, Memory, Initiation                       PT - Cognition Comments: pt oriented to self and being in hospital, pleasant, unable  to recall current date, decreased initiation with multimodal cues for activity completion Following commands: Impaired Following commands impaired: Only follows one step commands consistently, Follows one step commands with increased time    Cueing Cueing Techniques:  Verbal cues, Gestural cues, Tactile cues, Visual cues  Exercises      General Comments        Pertinent Vitals/Pain Pain Assessment Pain Assessment: Faces Faces Pain Scale: Hurts a little bit Pain Location: IV site Pain Descriptors / Indicators: Discomfort Pain Intervention(s): Limited activity within patient's tolerance, Monitored during session (notified RN)    Home Living                          Prior Function            PT Goals (current goals can now be found in the care plan section) Acute Rehab PT Goals Patient Stated Goal: to get stronger PT Goal Formulation: With patient Time For Goal Achievement: 01/11/24 Potential to Achieve Goals: Good Progress towards PT goals: Progressing toward goals    Frequency    Min 2X/week      PT Plan      Co-evaluation              AM-PAC PT 6 Clicks Mobility   Outcome Measure  Help needed turning from your back to your side while in a flat bed without using bedrails?: A Little Help needed moving from lying on your back to sitting on the side of a flat bed without using bedrails?: A Little Help needed moving to and from a bed to a chair (including a wheelchair)?: A Little Help needed standing up from a chair using your arms (e.g., wheelchair or bedside chair)?: A Little Help needed to walk in hospital room?: A Little Help needed climbing 3-5 steps with a railing? : A Little 6 Click Score: 18    End of Session Equipment Utilized During Treatment: Gait belt Activity Tolerance: Patient tolerated treatment well Patient left: in chair;with call bell/phone within reach;with chair alarm set Nurse Communication: Mobility status;Other (comment) (IV site pain) PT Visit Diagnosis: Muscle weakness (generalized) (M62.81);Unsteadiness on feet (R26.81);Other abnormalities of gait and mobility (R26.89)     Time: 9154-9091 PT Time Calculation (min) (ACUTE ONLY): 23 min  Charges:    $Gait Training: 8-22  mins $Therapeutic Activity: 8-22 mins PT General Charges $$ ACUTE PT VISIT: 1 Visit                     Tori Jessee Mezera PT, DPT 01/07/2024, 10:11 AM

## 2024-01-08 DIAGNOSIS — R531 Weakness: Secondary | ICD-10-CM | POA: Diagnosis not present

## 2024-01-08 LAB — GLUCOSE, CAPILLARY
Glucose-Capillary: 185 mg/dL — ABNORMAL HIGH (ref 70–99)
Glucose-Capillary: 238 mg/dL — ABNORMAL HIGH (ref 70–99)
Glucose-Capillary: 247 mg/dL — ABNORMAL HIGH (ref 70–99)
Glucose-Capillary: 229 mg/dL — ABNORMAL HIGH (ref 70–99)

## 2024-01-08 NOTE — Progress Notes (Signed)
 " Progress Note    Steve Brown   FMW:[id number]  DOB: 1940-08-09  DOA: 12/28/2023     11 PCP: [facility]  Initial CC: Fall, weakness, confusion  Hospital Course: Izic Stfort is a 83 y.o. male with medical history significant for dementia, A-fib on Eliquis , BPH, T2DM, GERD, CVA, CAD s/p stenting, DDD, prostate cancer, CKD 3B, anxiety, depression and a recent hospitalization for pneumonia and acute encephalopathy who presented to the ED via EMS for evaluation of altered mental status and generalized weakness.  According to EMS, patient lives alone in an apartment complex. The maintenance man noticed patient was weak and altered so he called EMS.  According to daughter, patient has a history of not eating or taking his meds when he is in pain.  Reports that patient has not taken his meds or eating in the last 3 days. Patient reports worsening in his left shoulder pain and a headache but denies any trauma or injury to the shoulder.  He endorses feeling weak but denies any fevers, chills, shortness of breath, chest pain, cough, abdominal pain, nausea, vomiting or dysuria.   He is able to tell his full name, DOB, identify daughter, current location, current president but not the month or the year.   ED Course: Initial vitals show patient afebrile, but hypertensive with SBP in the 140s to 160s. Initial labs significant for glucose 417, BUN/creatinine 28/1.65, lactic acid 2.1-12.6, normal CBC, ethanol level, ammonia and UDS. CT head with no acute intracranial abnormality. CXR shows improved left lung base region with decreasing opacity. Pt received Tylenol , IV Rocephin , azithromycin , IVF bolus and started on insulin  drip. TRH was consulted for admission.    Assessment/Plan:   Generalized weakness and deconditioning - In the setting of acute illness but also contribution from dementia - PT/OT evals; primarily recommendations focus on assistance with ADLs IADLs in context of  dementia-patient in need of SNF and/or LTC - Fall precautions   Social determinants of health Failure to thrive - Daughter is concern about patient's ability to care for self over the last few weeks - Family are looking for available resources for patient ([facility] patient) to assist with ADLs and medication - TOC consulted for assistance  Acute metabolic encephalopathy - resolved  Underlying dementia - Patient with dementia who lives alone now presenting with altered mental status for the second time in less than 2 weeks - Patient quite weak appearing with slowed movement, oriented to self, person and place but not to time -Continue preadmission donepezil  - Likely combination of severe hyperglycemia, dehydration, recent hospitalization, and some progression of his dementia - Delirium precautions   Severe hyperglycemia Uncontrolled type 2 diabetes - Patient found to have glucose of 417 on admission AG of 16 but no acidosis - Findings due to patient not taking his insulin  over the last few days in context of his dementia/memory loss - S/p fluids -Continue low-dose Semglee  7 units daily and sensitive SSI.  Modifying as needed - A1c 9.8 - CBGs over the past 24 hours have ranged between 188 and 265 -Will increase a.m. dose of Semglee  to 10 units-  CAP - resolved  - X-ray on admission showed improved opacity -Completed antibiotics after last hospitalization -Do not feel he needs ongoing antibiotics given treatment completed -Afebrile and no leukocytosis  HTN - BP elevated with SBP in the 140s to 160s - Continue losartan    CKD 3B - Creatinine of 1.65 around recent baseline of 1.5-1.9 - Trend renal function and  avoid nephrotoxic agents   Atrial fibrillation - Rate controlled - Continue Eliquis    Chronic left shoulder pain - Apply lidocaine  patch and continue as needed Norco -MRI left shoulder November 2025 revealed mild supraspinatus and subscap Ilaris tendinopathy with moderate  tendinopathy of the intra-articular segment of the long head of the biceps.  There was also moderate degenerative glenohumeral arthropathy and mild degenerative AC joint spurring with associated diffuse moderate regional muscular atrophy. -PT consult to focus on left arm and shoulder issues/strength   BPH - Continue finasteride  and Flomax    HLD - Continue rosuvastatin    Anxiety and depression - Continue bupropion , Lexapro  and Remeron   History of genital herpes Clarified that valacyclovir  is as needed based on recurrence of lesions-Will discontinue valacyclovir  for now  Interval History:  Patient seen at bedside this morning, he was anxious about leaving to Rehab. He has no acute complaints otherwise. He is awaiting placement at the [facility].    Antimicrobials: 1 dose of Zithromax  in the ED 1 dose of ceftriaxone    DVT prophylaxis:   apixaban  (ELIQUIS ) tablet 5 mg   Code Status:   Code Status: Full Code  Mobility Assessment (Last 72 Hours)     Mobility Assessment     Row Name 01/08/24 1100 01/07/24 2240 01/07/24 1010 01/07/24 0720 01/06/24 2112   Does the patient have exclusion criteria? No- Perform mobility assessment No- Perform mobility assessment -- No- Perform mobility assessment No- Perform mobility assessment   What is the highest level of mobility based on the mobility assessment? Level 4 (Ambulates with assistance) - Balance while stepping forward/back - Complete Level 4 (Ambulates with assistance) - Balance while stepping forward/back - Complete Level 4 (Ambulates with assistance) - Balance while stepping forward/back - Complete Level 3 (Stands with assistance) - Balance while standing  and cannot march in place Level 3 (Stands with assistance) - Balance while standing  and cannot march in place   Is the above level different from baseline mobility prior to current illness? No - Consider discontinuing PT/OT No - Consider discontinuing PT/OT -- Yes - Recommend PT order Yes -  Recommend PT order    Row Name 01/06/24 0838 01/06/24 0000         Does the patient have exclusion criteria? No- Perform mobility assessment No- Perform mobility assessment      What is the highest level of mobility based on the mobility assessment? Level 4 (Ambulates with assistance) - Balance while stepping forward/back - Complete Level 4 (Ambulates with assistance) - Balance while stepping forward/back - Complete         Diet: Diet Orders (From admission, onward)     Start     Ordered   12/29/23 0128  Diet Carb Modified Room service appropriate? Yes  Diet effective now       Question Answer Comment  Diet-HS Snack? Nothing   Calorie Level Medium 1600-2000   Fluid consistency: Thin   Room service appropriate? Yes      12/29/23 0131            Barriers to discharge: None Disposition Plan: Potentially SNF HH orders placed:  Status is: Inpatient  Objective: Blood pressure 117/69, pulse 65, temperature 98.5 F (36.9 C), temperature source Oral, resp. rate 18, height 5' 5 (1.651 m), weight 78.2 kg, SpO2 100%.   Examination:  Constitutional: NAD, calm, comfortable Respiratory: clear to auscultation bilaterally, no wheezing, no crackles. Normal respiratory effort. No accessory muscle use.  Cardiovascular: Regular rate and rhythm, no  murmurs / rubs / gallops. No extremity edema. 2+ pedal pulses.   Abdomen: no tenderness, no masses palpated. No hepatosplenomegaly. Bowel sounds positive.  Musculoskeletal: no clubbing / cyanosis. No joint deformity upper and lower extremities.  Decreased range of motion left shoulder in context of pain., no contractures. Normal muscle tone.  Skin: no rashes, lesions, ulcers. No induration Neurologic: CN 2-12 grossly intact. Sensation intact, Strength 5/5 x all 4 extremities.  Psychiatric: Normal judgment and insight. Alert and oriented x name only. Normal mood.     Consultants:  None  Procedures:  None  Data Reviewed: Results for orders  placed or performed during the hospital encounter of 12/28/23 (from the past 24 hours)  Glucose, capillary     Status: Abnormal   Collection Time: 01/07/24  4:34 PM  Result Value Ref Range   Glucose-Capillary 265 (H) 70 - 99 mg/dL   Comment 1 Notify RN    Comment 2 Document in Chart   Glucose, capillary     Status: Abnormal   Collection Time: 01/07/24 10:15 PM  Result Value Ref Range   Glucose-Capillary 267 (H) 70 - 99 mg/dL  Glucose, capillary     Status: Abnormal   Collection Time: 01/08/24  7:17 AM  Result Value Ref Range   Glucose-Capillary 185 (H) 70 - 99 mg/dL  Glucose, capillary     Status: Abnormal   Collection Time: 01/08/24 11:15 AM  Result Value Ref Range   Glucose-Capillary 238 (H) 70 - 99 mg/dL   Comment 1 Notify RN    Comment 2 Document in Chart     I have reviewed pertinent nursing notes, vitals, labs, and images as necessary. I have ordered labwork to follow up on as indicated.  I have reviewed the last notes from staff over past 24 hours. I have discussed patient's care plan and test results with nursing staff, CM/SW, and other staff as appropriate.  Old records reviewed in assessment of this patient    LOS: 11 days   Landon FORBES Baller, MD [facility] 01/08/2024, 12:12 PM "

## 2024-01-08 NOTE — Plan of Care (Signed)
  Problem: Clinical Measurements: Goal: Respiratory complications will improve Outcome: Progressing   Problem: Activity: Goal: Risk for activity intolerance will decrease Outcome: Progressing   Problem: Nutrition: Goal: Adequate nutrition will be maintained Outcome: Progressing   Problem: Elimination: Goal: Will not experience complications related to bowel motility Outcome: Progressing Goal: Will not experience complications related to urinary retention Outcome: Progressing   Problem: Pain Managment: Goal: General experience of comfort will improve and/or be controlled Outcome: Progressing   Problem: Safety: Goal: Ability to remain free from injury will improve Outcome: Progressing   Problem: Skin Integrity: Goal: Risk for impaired skin integrity will decrease Outcome: Progressing

## 2024-01-08 NOTE — Plan of Care (Signed)

## 2024-01-08 NOTE — TOC Progression Note (Signed)
 Transition of Care [facility]) - Progression Note    Patient Details  Name: Steve Brown MRN: [id number] Date of Birth: 05-16-1940  [facility]) CM/SW Contact  Heather DELENA Saltness, LCSW Phone Number: 01/08/2024, 10:13 AM  Clinical Narrative:    CSW received phone call from Asberry Day, child psychotherapist, at [place]. Asberry states that pt is not eligible for VA-supported LTC or short-term SNF rehab. Asberry  recommends pt use his Medicare as primary insurance coverage for SNF rehab. Medicare ID: [id number]. CSW completed FL2 and sent referrals to SNF's in [place] and surrounding areas. Currently pending bed offers. TOC will continue to follow.   Expected Discharge Plan: Skilled Nursing Facility Barriers to Discharge: Continued Medical Work up, SNF Pending bed offer   Expected Discharge Plan and Services In-house Referral: Clinical Social Work Discharge Planning Services: NA Post Acute Care Choice: Skilled Nursing Facility Living arrangements for the past 2 months: Apartment                 DME Arranged: N/A DME Agency: NA       HH Arranged: NA HH Agency: NA         Social Drivers of Health (SDOH) Interventions SDOH Screenings   Food Insecurity: No Food Insecurity (12/29/2023)  Housing: Low Risk (12/29/2023)  Transportation Needs: No Transportation Needs (12/29/2023)  Utilities: Not At Risk (12/29/2023)  Depression (PHQ2-9): Low Risk (12/14/2023)  Social Connections: Moderately Integrated (12/29/2023)  Tobacco Use: Low Risk (12/28/2023)    Readmission Risk Interventions    01/01/2024    2:26 PM 12/19/2023    3:48 PM  Readmission Risk Prevention Plan  Transportation Screening Complete Complete  Medication Review (RN Care Manager) Complete Referral to Pharmacy  PCP or Specialist appointment within 3-5 days of discharge Complete Complete  HRI or Home Care Consult Complete Complete  SW Recovery Care/Counseling Consult Complete Complete  Palliative  Care Screening Not Applicable Complete  Skilled Nursing Facility Complete Not Applicable    Signed: Heather Saltness, MSW, LCSW Clinical Social Worker Inpatient Care Management 01/08/2024 10:17 AM

## 2024-01-08 NOTE — [place] FL2 (Signed)
 " [place]  MEDICAID FL2 LEVEL OF CARE FORM     IDENTIFICATION  Patient Name: Steve Brown Birthdate: [birth year] Sex: male Admission Date (Current Location): 12/28/2023  [facility] [place] Number:  Producer, Television/film/video and Address:  [facility],  [address] [place], [place] [address]      Provider Number: [id number]  Attending Physician Name and Address:  Dibia, Landon BRAVO, MD  Relative Name and Phone Number:  Bolton, Canupp (Daughter)  [phone]    Current Level of Care: Hospital Recommended Level of Care: Skilled Nursing Facility Prior Approval Number:    Date Approved/Denied:   PASRR Number: [id number]  Discharge Plan: SNF    Current Diagnoses: Patient Active Problem List   Diagnosis Date Noted   Acute encephalopathy 12/28/2023   Failure to thrive in adult 12/28/2023   CKD stage 3b, GFR 30-44 ml/min (HCC) 12/28/2023   Paroxysmal atrial fibrillation (HCC) 12/28/2023   Severe hyperglycemia due to diabetes mellitus (HCC) 12/28/2023   Community acquired pneumonia 12/19/2023   Elevated troponin 12/18/2023   Encephalopathy 12/17/2023   Headache, temporal 11/21/2023   Left facial numbness 09/29/2023   Left arm numbness 09/29/2023   Left sided numbness 09/28/2023   Jaw pain, non-TMJ 03/20/2022   Temporal pain 03/20/2022   Jaw claudication 03/20/2022   Dizziness on standing 05/18/2021   Vision changes 05/18/2021   Chronic ischemic vertebrobasilar artery cerebellar stroke 05/18/2021   Amaurosis fugax, both eyes 01/18/2021   Transient diplopia 01/18/2021   Hereditary and idiopathic neuropathy, unspecified 11/20/2020   Hyperglycemia due to type 2 diabetes mellitus (HCC) 11/20/2020   Obesity 11/20/2020   Pure hypercholesterolemia 11/20/2020   Syncope 05/26/2020   Back pain 05/26/2020   Lumbar spinal stenosis 08/15/2019   Nonspecific chest pain 08/11/2019   Shortness of breath    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 (HCC) 10/21/2018    Nocturnal hypoxemia 10/21/2018   Radiation therapy complication 07/31/2017   Primary prostate cancer (HCC) 07/31/2017   Anemia 07/31/2017   Vertigo due to cerebrovascular disease 07/31/2017   Poor compliance with CPAP treatment 07/31/2017   Absolute anemia 05/16/2017   Avitaminosis D 05/16/2017   Benign essential HTN 05/16/2017   Benign prostatic hyperplasia without urinary obstruction 05/16/2017   Clinical depression 05/16/2017   Constipation 05/16/2017   Current drug use 05/16/2017   Diabetic neuropathy (HCC) 05/16/2017   Genital herpes 05/16/2017   Infarction of lung due to iatrogenic pulmonary embolism (HCC) 05/16/2017   Sciatica associated with disorder of lumbar spine 05/16/2017   Coronary artery disease involving native coronary artery of native heart 05/16/2017   Artery disease, cerebral 05/16/2017   Apnea, sleep 05/16/2017   Arthralgia of hip or thigh 05/16/2017   Malignant neoplasm of prostate (HCC) 09/28/2016   Vascular dementia in remission (HCC) 09/28/2016   Remote history of stroke 09/28/2016   Acute kidney injury 05/18/2016   Dehydration 05/18/2016   Near syncope 05/18/2016   Orthostatic hypotension 05/18/2016   CKD (chronic kidney disease), stage II 04/26/2016   UTI (urinary tract infection) 04/26/2016   Encounter for counseling on use of CPAP 11/18/2015   Chronic cholecystitis with calculus 11/02/2015   Stroke, 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HCC) 04/22/2015   TIA (transient ischemic attack) 11/03/2014   Obstructive sleep apnea 05/18/2014   White matter disease 08/14/2013   Abnormal x-ray of temporomandibular joint 05/30/2013   Chronic infection of sinus 05/30/2013   Cough 05/30/2013   Generalized weakness 05/30/2013   Unsteady gait 05/19/2013   Combined fat and carbohydrate induced  hyperlipemia 04/24/2013   Syncope 03/20/2013   Angina pectoris (HCC) 10/08/2012   Hypertension    Stroke [facility])    History of TIAs    Lumbago    Other and unspecified hyperlipidemia     History of cardiovascular disorder    Unwitnessed fall    Pain in joint, multiple sites    Degeneration of cervical intervertebral disc    Cardiovascular disease    History of pulmonary embolism: June 2014,  Takes Xarelto  07/01/2012   Hemiparesis (HCC) 07/18/2011   Diabetes mellitus type 2 with complications (HCC) 07/18/2011   CAD in native artery 07/18/2011   History of recurrent TIAs 07/18/2011    Orientation RESPIRATION BLADDER Height & Weight     Self  Normal Incontinent Weight: 172 lb 6.4 oz (78.2 kg) Height:  5' 5 (165.1 cm)  BEHAVIORAL SYMPTOMS/MOOD NEUROLOGICAL BOWEL NUTRITION STATUS      Continent Diet (Carb Modified)  AMBULATORY STATUS COMMUNICATION OF NEEDS Skin   Supervision Verbally Normal                       Personal Care Assistance Level of Assistance  Bathing, Feeding, Dressing Bathing Assistance: Maximum assistance Feeding assistance: Independent Dressing Assistance: Maximum assistance     Functional Limitations Info  Sight, Hearing, Speech Sight Info: Adequate Hearing Info: Adequate Speech Info: Adequate    SPECIAL CARE FACTORS FREQUENCY  PT (By licensed PT), OT (By licensed OT)     PT Frequency: 5x per week OT Frequency: 5x per week            Contractures Contractures Info: Not present    Additional Factors Info  Code Status, Allergies Code Status Info: FULL Allergies Info: Other, Phenergan  (Promethazine ), Topamax (Topiramate), Ultram  (Tramadol )           Current Medications (01/08/2024):  This is the current hospital active medication list Current Facility-Administered Medications  Medication Dose Route Frequency Provider Last Rate Last Admin   acetaminophen  (TYLENOL ) tablet 650 mg  650 mg Oral Q6H PRN Amponsah, Prosper M, MD   650 mg at 01/03/24 1014   Or   acetaminophen  (TYLENOL ) suppository 650 mg  650 mg Rectal Q6H PRN Lou Claretta HERO, MD       apixaban  (ELIQUIS ) tablet 5 mg  5 mg Oral BID Amponsah, Prosper M, MD    5 mg at 01/08/24 1010   bisacodyl  (DULCOLAX) EC tablet 5 mg  5 mg Oral Daily PRN Amponsah, Prosper M, MD   5 mg at 12/31/23 1216   buPROPion  (WELLBUTRIN  XL) 24 hr tablet 150 mg  150 mg Oral Daily Amponsah, Prosper M, MD   150 mg at 01/08/24 1009   cholecalciferol  (VITAMIN D3) 25 MCG (1000 UNIT) tablet 1,000 Units  1,000 Units Oral Daily Odell Celinda Balo, MD   1,000 Units at 01/08/24 1010   dextrose  50 % solution 0-50 mL  0-50 mL Intravenous PRN Nivia Colon, PA-C       diclofenac  Sodium (VOLTAREN ) 1 % topical gel 4 g  4 g Topical QID PRN Amponsah, Prosper M, MD   4 g at 01/07/24 1204   donepezil  (ARICEPT ) tablet 10 mg  10 mg Oral Daily Amponsah, Prosper M, MD   10 mg at 01/07/24 2242   escitalopram  (LEXAPRO ) tablet 20 mg  20 mg Oral q AM Amponsah, Prosper M, MD   20 mg at 01/08/24 1010   finasteride  (PROSCAR ) tablet 5 mg  5 mg Oral Daily Amponsah, Prosper M, MD  5 mg at 01/08/24 1009   fluticasone  (FLONASE ) 50 MCG/ACT nasal spray 2 spray  2 spray Each Nare Daily PRN Odell Celinda Balo, MD       hydrALAZINE  (APRESOLINE ) tablet 25 mg  25 mg Oral Q6H PRN Odell Celinda Balo, MD       HYDROcodone -acetaminophen  (NORCO/VICODIN) 5-325 MG per tablet 1 tablet  1 tablet Oral Q6H PRN Lou Claretta HERO, MD   1 tablet at 01/07/24 2242   insulin  aspart (novoLOG ) injection 0-5 Units  0-5 Units Subcutaneous QHS Odell Celinda Balo, MD   3 Units at 01/07/24 2242   insulin  aspart (novoLOG ) injection 0-9 Units  0-9 Units Subcutaneous TID WC Mark Bard LABOR, RPH   2 Units at 01/08/24 9149   insulin  glargine-yfgn (SEMGLEE ) injection 10 Units  10 Units Subcutaneous Daily Alto Isaiah CROME, NP   10 Units at 01/08/24 1010   lidocaine  (LIDODERM ) 5 % 1 patch  1 patch Transdermal Q24H Lou Claretta HERO, MD   1 patch at 01/07/24 2251   losartan  (COZAAR ) tablet 25 mg  25 mg Oral Daily Odell Celinda Balo, MD   25 mg at 01/08/24 1009   melatonin tablet 5 mg  5 mg Oral QHS PRN Patsy Lenis, MD   5 mg at 01/07/24  2242   mirtazapine  (REMERON ) tablet 7.5 mg  7.5 mg Oral QHS PRN Amponsah, Prosper M, MD   7.5 mg at 01/04/24 2233   ondansetron  (ZOFRAN ) tablet 4 mg  4 mg Oral Q6H PRN Amponsah, Prosper M, MD       Or   ondansetron  (ZOFRAN ) injection 4 mg  4 mg Intravenous Q6H PRN Amponsah, Prosper M, MD       pantoprazole  (PROTONIX ) EC tablet 40 mg  40 mg Oral Daily Lou Claretta HERO, MD   40 mg at 01/08/24 1009   rosuvastatin  (CRESTOR ) tablet 40 mg  40 mg Oral Daily Amponsah, Prosper M, MD   40 mg at 01/08/24 1009   senna-docusate (Senokot-S) tablet 1 tablet  1 tablet Oral QHS PRN Lou Claretta HERO, MD       tamsulosin  (FLOMAX ) capsule 0.4 mg  0.4 mg Oral QHS Girguis, David, MD   0.4 mg at 01/07/24 2242     Discharge Medications: Please see discharge summary for a list of discharge medications.  Relevant Imaging Results:  Relevant Lab Results:   Additional Information SSN: [id number]  Heather LABOR Juancarlos Crescenzo, LCSW     "

## 2024-01-08 NOTE — Progress Notes (Signed)
 Occupational Therapy Treatment Patient Details Name: Steve Brown MRN: [id number] DOB: Jun 04, 1940 Today's Date: 01/08/2024   History of present illness Patient is a 83 y.o. male admitted 12/28/2023 with altered mental status and generalized weakness. Recent hospitalization last week for pneumonia and acute encephalopathy. PMH: dementia, A-fib on Eliquis ,/ BPH, T2DM, GERD, CVA, CAD s/p stenting, DDD, prostate cancer, CKD 3B, anxiety, depression.   OT comments  Abbreviated OT session to assist pt into chair for lunch with Min-CGA needed using RW. Pt politely deferred further mobility until later. Pt able to complete UB ADLs with Setup Assist seated in recliner and left eating lunch. Patient will benefit from continued inpatient follow up therapy, <3 hours/day at DC.      If plan is discharge home, recommend the following:  A little help with walking and/or transfers;A lot of help with bathing/dressing/bathroom   Equipment Recommendations  Other (comment) (RW)    Recommendations for Other Services      Precautions / Restrictions Precautions Precautions: Fall Restrictions Weight Bearing Restrictions Per Provider Order: No       Mobility Bed Mobility Overal bed mobility: Modified Independent Bed Mobility: Supine to Sit     Supine to sit: Modified independent (Device/Increase time), Used rails, HOB elevated     General bed mobility comments: increased time    Transfers Overall transfer level: Needs assistance Equipment used: Rolling walker (2 wheels) Transfers: Sit to/from Stand, Bed to chair/wheelchair/BSC Sit to Stand: Min assist     Step pivot transfers: Contact guard assist     General transfer comment: Min A to stand from bedside. Once up, able to take steps to recliner using RW with CGA     Balance Overall balance assessment: Needs assistance Sitting-balance support: Feet supported, Bilateral upper extremity supported Sitting balance-Leahy Scale: Fair      Standing balance support: During functional activity, Bilateral upper extremity supported, Reliant on assistive device for balance Standing balance-Leahy Scale: Poor                             ADL either performed or assessed with clinical judgement   ADL Overall ADL's : Needs assistance/impaired Eating/Feeding: Independent;Sitting   Grooming: Set up;Sitting;Wash/dry face;Wash/dry Programmer, [employer] Details (indicate cue type and reason): in prep for lunch                               General ADL Comments: Emphasis on OOB transfer to chair for lunch. Pt politely declined further mobility at this time.    Extremity/Trunk Assessment Upper Extremity Assessment Upper Extremity Assessment: Overall WFL for tasks assessed;Right hand dominant   Lower Extremity Assessment Lower Extremity Assessment: Defer to PT evaluation        Vision   Vision Assessment?: No apparent visual deficits   Perception     Praxis     Communication Communication Communication: No apparent difficulties   Cognition Arousal: Alert Behavior During Therapy: WFL for tasks assessed/performed Cognition: History of cognitive impairments                               Following commands: Impaired Following commands impaired: Only follows one step commands consistently, Follows one step commands with increased time      Cueing   Cueing Techniques: Verbal cues, Gestural cues, Tactile cues, Visual cues  Exercises  Shoulder Instructions       General Comments      Pertinent Vitals/ Pain       Pain Assessment Pain Assessment: No/denies pain  Home Living                                          Prior Functioning/Environment              Frequency  Min 2X/week        Progress Toward Goals  OT Goals(current goals can now be found in the care plan section)  Progress towards OT goals: Progressing toward goals  Acute Rehab OT  Goals Patient Stated Goal: eat some lunch, get to a rehab OT Goal Formulation: With patient Time For Goal Achievement: 01/12/24 Potential to Achieve Goals: Good ADL Goals Pt Will Perform Grooming: with contact guard assist;standing Pt Will Perform Upper Body Bathing: with contact guard assist;with supervision;sitting Pt Will Perform Lower Body Bathing: with mod assist;with min assist;sitting/lateral leans;sit to/from stand Pt Will Perform Upper Body Dressing: with contact guard assist;with supervision;sitting Pt Will Transfer to Toilet: with min assist;with contact guard assist;ambulating Pt Will Perform Toileting - Clothing Manipulation and hygiene: with mod assist;with min assist;sitting/lateral leans;sit to/from stand  Plan      Co-evaluation                 AM-PAC OT 6 Clicks Daily Activity     Outcome Measure   Help from another person eating meals?: None Help from another person taking care of personal grooming?: A Little Help from another person toileting, which includes using toliet, bedpan, or urinal?: A Lot Help from another person bathing (including washing, rinsing, drying)?: A Lot Help from another person to put on and taking off regular upper body clothing?: A Little Help from another person to put on and taking off regular lower body clothing?: A Lot 6 Click Score: 16    End of Session Equipment Utilized During Treatment: Rolling walker (2 wheels)  OT Visit Diagnosis: Unsteadiness on feet (R26.81);Muscle weakness (generalized) (M62.81);Pain;Other abnormalities of gait and mobility (R26.89)   Activity Tolerance Patient tolerated treatment well   Patient Left in chair;with call bell/phone within reach;with chair alarm set   Nurse Communication Other (comment) (NT present)        Time: 8679-8668 OT Time Calculation (min): 11 min  Charges: OT General Charges $OT Visit: 1 Visit OT Treatments $Therapeutic Activity: 8-22 mins  Mliss NOVAK, OTR/L Acute  Rehab Services Office: [phone]   Mliss Fish 01/08/2024, 2:03 PM

## 2024-01-08 NOTE — Progress Notes (Signed)
 Mobility Specialist Progress Note:   01/08/24 1225  Mobility  Activity Dangled on edge of bed (Bed Exercises)  Level of Assistance Independent  Range of Motion/Exercises Active  Activity Response Tolerated fair  Mobility Referral Yes  Mobility visit 1 Mobility  Mobility Specialist Start Time (ACUTE ONLY) 1140  Mobility Specialist Stop Time (ACUTE ONLY) 1150  Mobility Specialist Time Calculation (min) (ACUTE ONLY) 10 min   Pt was received in bed and agreed to mobility. Pt stated too much fatigue for ambulation, opted for bedside exercises: Seated BLE Exercises:   1) Ankle Pumps: 1 x 8  2) Knee Extension: 1 x 8 each leg  3) Marching: 1 x 8 each leg  Pt had no complaints/issues during session, returned to bed with all needs met. Call bell in reach and bed alarm on.   [employer] - Mobility Specialist

## 2024-01-09 DIAGNOSIS — R531 Weakness: Secondary | ICD-10-CM | POA: Diagnosis not present

## 2024-01-09 LAB — GLUCOSE, CAPILLARY
Glucose-Capillary: 171 mg/dL — ABNORMAL HIGH (ref 70–99)
Glucose-Capillary: 197 mg/dL — ABNORMAL HIGH (ref 70–99)
Glucose-Capillary: 318 mg/dL — ABNORMAL HIGH (ref 70–99)
Glucose-Capillary: 169 mg/dL — ABNORMAL HIGH (ref 70–99)

## 2024-01-09 NOTE — Inpatient Diabetes Management (Signed)
 Inpatient Diabetes Program Recommendations  AACE/ADA: New Consensus Statement on Inpatient Glycemic Control (2015)  Target Ranges:  Prepandial:   less than 140 mg/dL      Peak postprandial:   less than 180 mg/dL (1-2 hours)      Critically ill patients:  140 - 180 mg/dL   Lab Results  Component Value Date   GLUCAP 318 (H) 01/09/2024   HGBA1C 9.8 (H) 12/29/2023    Review of Glycemic Control  Diabetes history: DM2 Outpatient Diabetes medications: Tresiba  26 daily, Novolog  5-8 units TID with meals  Current orders for Inpatient glycemic control: Lantus  10 daily, Novolog  0-9 TID with meals and 0-5 HS  Inpatient Diabetes Program Recommendations:    Consider adding Novolog  3-4 units TID with meals if eating > 50%  Continue to follow.  Thank you. Shona Brandy, RD, LDN, CDCES Inpatient Diabetes Coordinator [phone]

## 2024-01-09 NOTE — Plan of Care (Signed)
" °  Problem: Education: Goal: Knowledge of General Education information will improve Description: Including pain rating scale, medication(s)/side effects and non-pharmacologic comfort measures Outcome: Progressing   Problem: Clinical Measurements: Goal: Ability to maintain clinical measurements within normal limits will improve Outcome: Progressing Goal: Will remain free from infection Outcome: Progressing Goal: Diagnostic test results will improve Outcome: Progressing Goal: Respiratory complications will improve Outcome: Progressing Goal: Cardiovascular complication will be avoided Outcome: Progressing   Problem: Activity: Goal: Risk for activity intolerance will decrease Outcome: Progressing   Problem: Nutrition: Goal: Adequate nutrition will be maintained Outcome: Progressing   Problem: Coping: Goal: Level of anxiety will decrease Outcome: Progressing   Problem: Elimination: Goal: Will not experience complications related to bowel motility Outcome: Progressing Goal: Will not experience complications related to urinary retention Outcome: Progressing   Problem: Pain Managment: Goal: General experience of comfort will improve and/or be controlled Outcome: Progressing   Problem: Safety: Goal: Ability to remain free from injury will improve Outcome: Progressing   Problem: Skin Integrity: Goal: Risk for impaired skin integrity will decrease Outcome: Progressing   Problem: Metabolic: Goal: Ability to maintain appropriate glucose levels will improve Outcome: Progressing   Problem: Nutritional: Goal: Maintenance of adequate nutrition will improve Outcome: Progressing   Problem: Skin Integrity: Goal: Risk for impaired skin integrity will decrease Outcome: Progressing   Problem: Tissue Perfusion: Goal: Adequacy of tissue perfusion will improve Outcome: Progressing   "

## 2024-01-09 NOTE — TOC Progression Note (Addendum)
 Transition of Care [facility]) - Progression Note    Patient Details  Name: Steve Brown MRN: [id number] Date of Birth: [birth year]  [facility]) CM/SW Contact  Doneta Glenys DASEN, RN Phone Number: 01/09/2024, 9:06 AM  Clinical Narrative:    5:01 PM Emmalene Hertz updated. No insurance auth required. Accepting patient on 1/1 if discharged CM called [name] (dlt) for [facility]. Choice List given 1511- Molly Marsa       Service Provider Request Status STAR(s) Address Phone Patient Preferred  [facility] Preferred SNF  Accepted 5 [address] [place] [address] [phone]   [facility] Preferred SNF  Accepted 1 [address] [place] [address] [phone]   [facility]  Accepted 2 [address], [place] [address] [phone]   [facility] SNF  Accepted 3 [address] [place], [place] [address] [phone]      Expected Discharge Plan: Skilled Nursing Facility Barriers to Discharge: Continued Medical Work up, SNF Pending bed offer               Expected Discharge Plan and Services In-house Referral: Clinical Social Work Discharge Planning Services: NA Post Acute Care Choice: Skilled Nursing Facility Living arrangements for the past 2 months: Apartment                 DME Arranged: N/A DME Agency: NA       HH Arranged: NA HH Agency: NA         Social Drivers of Health (SDOH) Interventions SDOH Screenings   Food Insecurity: No Food Insecurity (12/29/2023)  Housing: Low Risk (12/29/2023)  Transportation Needs: No Transportation Needs (12/29/2023)  Utilities: Not At Risk (12/29/2023)  Depression (PHQ2-9): Low Risk (12/14/2023)  Social Connections: Moderately Integrated (12/29/2023)  Tobacco Use: Low Risk (12/28/2023)    Readmission Risk Interventions    01/01/2024    2:26 PM 12/19/2023    3:48 PM  Readmission Risk Prevention Plan  Transportation  Screening Complete Complete  Medication Review (RN Care Manager) Complete Referral to Pharmacy  PCP or Specialist appointment within 3-5 days of discharge Complete Complete  HRI or Home Care Consult Complete Complete  SW Recovery Care/Counseling Consult Complete Complete  Palliative Care Screening Not Applicable Complete  Skilled Nursing Facility Complete Not Applicable

## 2024-01-09 NOTE — Progress Notes (Signed)
 Physical Therapy Treatment Patient Details Name: Steve Brown MRN: [id number] DOB: 01/04/1941 Today's Date: 01/09/2024   History of Present Illness Patient is a 83 y.o. male admitted 12/28/2023 with altered mental status and generalized weakness. Recent hospitalization last week for pneumonia and acute encephalopathy. PMH: dementia, A-fib on Eliquis ,/ BPH, T2DM, GERD, CVA, CAD s/p stenting, DDD, prostate cancer, CKD 3B, anxiety, depression.    PT Comments  Pt AxO x 1 pleasant and able to give some back history.  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  Unable to recall past few days and how he got here.  He did state Pneumonia when asked.  Required repeat instructions to complete a three step command task.  Tends to mask his confusion with humor and likes to talk but at times presents with word scramble.  Nursing reports increased confusion at night.  Assisted OOB to amb in hallway presents with balance instability and weakness.  General transfer comment: Min Assist for mild instability using back of legs to support self on bed and recliner.  Use of hands to steady self/catch self. General Gait Details: tolerated a amb 185 feet however required x 2 standing rest breaks and presented with mild dyspnea.  RA avg 97%.  Increased gait instability with increased distance.  Max c/o fatigue after.  Required an extended seated rest break before next activity.  Performed a BERG balance test which Pt scored low 22/56 indicating HIGH FALL RISK.  LPT has rec Pt will need ST Rehab at SNF to address mobility and functional decline prior to safely returning home.    If plan is discharge home, recommend the following: A little help with walking and/or transfers;A little help with bathing/dressing/bathroom;Assistance with cooking/housework;Assist for transportation;Supervision due to cognitive status;Help with stairs or ramp for entrance   Can travel by private vehicle     Yes  Equipment Recommendations  None  recommended by PT    Recommendations for Other Services       Precautions / Restrictions Precautions Precautions: Fall Restrictions Weight Bearing Restrictions Per Provider Order: No     Mobility  Bed Mobility Overal bed mobility: Needs Assistance Bed Mobility: Supine to Sit     Supine to sit: Supervision     General bed mobility comments: self able to transition to EOB but requested to sit a minute with mod c/o weakness.  RA 97%    Transfers Overall transfer level: Needs assistance Equipment used: None Transfers: Sit to/from Stand Sit to Stand: Min assist           General transfer comment: Min Assist for mild instability using back of legs to support self on bed and recliner.  Use of hands to steady self/catch self.    Ambulation/Gait Ambulation/Gait assistance: Contact guard assist, Min assist Gait Distance (Feet): 185 Feet Assistive device: None Gait Pattern/deviations: Decreased step length - right, Decreased step length - left, Decreased stride length Gait velocity: decreased     General Gait Details: tolerated a amb 185 feet however required x 2 standing rest breaks and presented with mild dyspnea.  RA avg 97%.  Increased gait instability with increased distance.  Max c/o fatigue after.  Required an extended seated rest break before next activity.   Stairs Stairs:  (declined to attempt stairs.  Pt stated he uses the elevator to get to his second floor apartment.)           Wheelchair Mobility     Tilt Bed    Modified Rankin (Stroke Patients Only)  Balance Overall balance assessment: Needs assistance           Standing balance-Leahy Scale: Poor                   Standardized Balance Assessment Standardized Balance Assessment : Berg Balance Test Berg Balance Test Sit to Stand: Able to stand  independently using hands Standing Unsupported: Able to stand 30 seconds unsupported Sitting with Back Unsupported but Feet  Supported on Floor or Stool: Able to sit 2 minutes under supervision Stand to Sit: Sits independently, has uncontrolled descent Transfers: Able to transfer with verbal cueing and /or supervision Standing Unsupported with Eyes Closed: Able to stand 3 seconds Standing Ubsupported with Feet Together: Needs help to attain position and unable to hold for 15 seconds From Standing, Reach Forward with Outstretched Arm: Can reach forward >5 cm safely (2) From Standing Position, Pick up Object from Floor: Able to pick up shoe, needs supervision From Standing Position, Turn to Look Behind Over each Shoulder: Looks behind one side only/other side shows less weight shift Turn 360 Degrees: Needs close supervision or verbal cueing Standing Unsupported, Alternately Place Feet on Step/Stool: Needs assistance to keep from falling or unable to try Standing Unsupported, One Foot in Front: Loses balance while stepping or standing Standing on One Leg: Unable to try or needs assist to prevent fall Total Score: 22        Communication    Cognition Arousal: Alert Behavior During Therapy: WFL for tasks assessed/performed   PT - Cognitive impairments: Problem solving, Safety/Judgement, Memory   Orientation impairments: Time, Situation                   PT - Cognition Comments: Pt AxO x 1 pleasant and able to give some back history.  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  Unable to recall past few days and how he got here.  He did state Pneumonia when asked.  Required repeat instructions to complete a three step command task.  Tends to mask his confusion with humor and likes to talk but at times presents with word scramble.  Nursing reports increased confusion at night. Following commands: Impaired Following commands impaired: Only follows one step commands consistently, Follows one step commands with increased time    Cueing Cueing Techniques: Verbal cues, Gestural cues, Tactile cues, Visual cues  Exercises       General Comments        Pertinent Vitals/Pain Pain Assessment Pain Assessment: Faces Faces Pain Scale: Hurts a little bit Pain Location: L UE (edema from now D/C IV) Pain Descriptors / Indicators: Discomfort    Home Living                          Prior Function            PT Goals (current goals can now be found in the care plan section)      Frequency           PT Plan      Co-evaluation              AM-PAC PT 6 Clicks Mobility   Outcome Measure  Help needed turning from your back to your side while in a flat bed without using bedrails?: A Little Help needed moving from lying on your back to sitting on the side of a flat bed without using bedrails?: A Little Help needed moving to and from a bed to  a chair (including a wheelchair)?: A Little Help needed standing up from a chair using your arms (e.g., wheelchair or bedside chair)?: A Little Help needed to walk in hospital room?: A Lot Help needed climbing 3-5 steps with a railing? : A Lot 6 Click Score: 16    End of Session Equipment Utilized During Treatment: Gait belt Activity Tolerance: Patient tolerated treatment well Patient left: in chair;with call bell/phone within reach;with chair alarm set Nurse Communication: Mobility status PT Visit Diagnosis: Muscle weakness (generalized) (M62.81);Unsteadiness on feet (R26.81);Other abnormalities of gait and mobility (R26.89)     Time: 8648-8590 PT Time Calculation (min) (ACUTE ONLY): 18 min  Charges:    $Gait Training: 8-22 mins PT General Charges $$ ACUTE PT VISIT: 1 Visit                    Katheryn Leap  PTA Acute  Rehabilitation Services Office M-F          [phone]

## 2024-01-09 NOTE — Plan of Care (Signed)
  Problem: Education: Goal: Knowledge of General Education information will improve Description: Including pain rating scale, medication(s)/side effects and non-pharmacologic comfort measures Outcome: Progressing   Problem: Clinical Measurements: Goal: Ability to maintain clinical measurements within normal limits will improve Outcome: Progressing   Problem: Clinical Measurements: Goal: Will remain free from infection Outcome: Progressing   Problem: Activity: Goal: Risk for activity intolerance will decrease Outcome: Progressing   

## 2024-01-09 NOTE — Plan of Care (Signed)
" °  Problem: Education: Goal: Knowledge of General Education information will improve Description: Including pain rating scale, medication(s)/side effects and non-pharmacologic comfort measures Outcome: Progressing   Problem: Activity: Goal: Risk for activity intolerance will decrease Outcome: Progressing   Problem: Elimination: Goal: Will not experience complications related to bowel motility Outcome: Progressing Goal: Will not experience complications related to urinary retention Outcome: Progressing   Problem: Pain Managment: Goal: General experience of comfort will improve and/or be controlled Outcome: Progressing   Problem: Safety: Goal: Ability to remain free from injury will improve Outcome: Progressing   Problem: Skin Integrity: Goal: Risk for impaired skin integrity will decrease Outcome: Progressing   Problem: Fluid Volume: Goal: Ability to maintain a balanced intake and output will improve Outcome: Progressing   "

## 2024-01-09 NOTE — Progress Notes (Signed)
 " PROGRESS NOTE    Steve Brown  FMW:[id number] DOB: 30-Dec-1940 DOA: 12/28/2023 PCP: [facility]    Brief Narrative:  83 year old with history of dementia, A-fib on Eliquis , BPH, type 2 diabetes, GERD, history of stroke, CKD stage IIIb, anxiety and depression presented to the ER with altered mental status, generalized weakness.  Lives alone.  He was found to be very weak and confused by maintenance man of the apartment so EMS was called.  In the emergency room, afebrile.  Blood pressure initially elevated.  Blood glucose 417.  Drug screen negative.  CT head negative.  Admitted due to significant symptoms.  Clinically improved.  Waiting to go to SNF.  Subjective: Patient seen and examined.  Denies any complaints.  Denies any new illnesses. Assessment & Plan:   Generalized weakness and deconditioning: Generalized debility, deconditioning due to less mobility.  Work with PT OT.  Referring to SNF for rehab.  Acute metabolic encephalopathy, resolved.  Patient with underlying dementia: Significant symptoms.  Needs assistance, rehab.  Severe hyperglycemia, uncontrolled type 2 diabetes: Presented with blood sugar of 417, anion gap of 16.  Patient was probably not taking insulin .  Started on low-dose Semglee  and sliding scale insulin .  Stable today.  [facility] pneumonia: Present on admission.  Improved.  Completed course of antibiotics.  CKD stage IIIb: At about baseline.  Monitor closely.  Paroxysmal A-fib: Rate controlled.  Therapeutic on Eliquis .  BPH: On finasteride  and Flomax  continue.  Anxiety depression: On bupropion , Lexapro  and Remeron .  Continue.    DVT prophylaxis:  apixaban  (ELIQUIS ) tablet 5 mg   Code Status: Full code Family Communication: None at the bedside Disposition Plan: Status is: Inpatient Remains inpatient appropriate because: Medically stable.  Waiting to go to a SNF.     Consultants:  None  Procedures:  None  Antimicrobials:   Completed     Objective: Vitals:   01/08/24 1113 01/08/24 2102 01/09/24 0536 01/09/24 1222  BP: 117/69 (!) 142/77 126/78 124/64  Pulse: 65 62 68 67  Resp:  18 18 12   Temp: 98.5 F (36.9 C) 97.8 F (36.6 C) 97.8 F (36.6 C) 98.5 F (36.9 C)  TempSrc: Oral Oral    SpO2:  100% 97% 95%  Weight:      Height:        Intake/Output Summary (Last 24 hours) at 01/09/2024 1305 Last data filed at 01/09/2024 0900 Gross per 24 hour  Intake 100 ml  Output --  Net 100 ml   Filed Weights   12/28/23 1513  Weight: 78.2 kg    Examination:  General exam: Appears calm and comfortable.  Chronically sick looking man not in distress.  Respiratory system: Clear to auscultation. Respiratory effort normal. Cardiovascular system: S1 & S2 heard, RRR.  Gastrointestinal system: Soft.  Nontender.  Bowel sound present. Central nervous system: Alert and awake.  Mostly oriented.  Flat affect.   Data Reviewed: I have personally reviewed following labs and imaging studies  CBC: Recent Labs  Lab 01/07/24 0537  WBC 5.2  HGB 11.3*  HCT 33.0*  MCV 93.8  PLT 165   Basic Metabolic Panel: Recent Labs  Lab 01/07/24 0537  NA 139  K 4.3  CL 102  CO2 26  GLUCOSE 190*  BUN 26*  CREATININE 1.86*  CALCIUM  8.7*   GFR: Estimated Creatinine Clearance: 30 mL/min (A) (by C-G formula based on SCr of 1.86 mg/dL (H)). Liver Function Tests: Recent Labs  Lab 01/07/24 0537  AST 21  ALT 14  ALKPHOS 61  BILITOT 0.5  PROT 5.8*  ALBUMIN  3.2*   No results for input(s): LIPASE, AMYLASE in the last 168 hours. No results for input(s): AMMONIA in the last 168 hours. Coagulation Profile: No results for input(s): INR, PROTIME in the last 168 hours. Cardiac Enzymes: No results for input(s): CKTOTAL, CKMB, CKMBINDEX, TROPONINI in the last 168 hours. BNP (last 3 results) No results for input(s): PROBNP in the last 8760 hours. HbA1C: No results for input(s): HGBA1C in the last 72  hours. CBG: Recent Labs  Lab 01/08/24 1115 01/08/24 1626 01/08/24 2112 01/09/24 0715 01/09/24 1126  GLUCAP 238* 247* 229* 171* 197*   Lipid Profile: No results for input(s): CHOL, HDL, LDLCALC, TRIG, CHOLHDL, LDLDIRECT in the last 72 hours. Thyroid  Function Tests: No results for input(s): TSH, T4TOTAL, FREET4, T3FREE, THYROIDAB in the last 72 hours. Anemia Panel: No results for input(s): VITAMINB12, FOLATE, FERRITIN, TIBC, IRON, RETICCTPCT in the last 72 hours. Sepsis Labs: No results for input(s): PROCALCITON, LATICACIDVEN in the last 168 hours.  No results found for this or any previous visit (from the past 240 hours).       Radiology Studies: No results found.      Scheduled Meds:  apixaban   5 mg Oral BID   buPROPion   150 mg Oral Daily   cholecalciferol   1,000 Units Oral Daily   donepezil   10 mg Oral Daily   escitalopram   20 mg Oral q AM   finasteride   5 mg Oral Daily   insulin  aspart  0-5 Units Subcutaneous QHS   insulin  aspart  0-9 Units Subcutaneous TID WC   insulin  glargine-yfgn  10 Units Subcutaneous Daily   lidocaine   1 patch Transdermal Q24H   losartan   25 mg Oral Daily   pantoprazole   40 mg Oral Daily   rosuvastatin   40 mg Oral Daily   tamsulosin   0.4 mg Oral QHS   Continuous Infusions:   LOS: 12 days    Time spent: 40 minutes    Renato Applebaum, MD [facility]   "

## 2024-01-10 DIAGNOSIS — R531 Weakness: Secondary | ICD-10-CM | POA: Diagnosis not present

## 2024-01-10 LAB — GLUCOSE, CAPILLARY
Glucose-Capillary: 134 mg/dL — ABNORMAL HIGH (ref 70–99)
Glucose-Capillary: 201 mg/dL — ABNORMAL HIGH (ref 70–99)

## 2024-01-10 MED ORDER — HYDROCODONE-ACETAMINOPHEN 5-325 MG PO TABS: 1.0000 | ORAL_TABLET | Freq: Four times a day (QID) | ORAL | 0 refills | Status: AC | PRN

## 2024-01-10 MED ORDER — INSULIN GLARGINE-YFGN 100 UNIT/ML ~~LOC~~ SOLN: 10.0000 [IU] | Freq: Every day | SUBCUTANEOUS | Status: AC

## 2024-01-10 MED ORDER — MELATONIN 5 MG PO TABS: 5.0000 mg | ORAL_TABLET | Freq: Every evening | ORAL | Status: AC | PRN

## 2024-01-10 MED ORDER — INSULIN ASPART 100 UNIT/ML FLEXPEN: 0.0000 [IU] | PEN_INJECTOR | Freq: Three times a day (TID) | SUBCUTANEOUS | Status: AC

## 2024-01-10 NOTE — TOC Transition Note (Addendum)
 Transition of Care [facility]) - Discharge Note   Patient Details  Name: Steve Brown MRN: [id number] Date of Birth: 09-Sep-1940  [facility] CM/SW Contact:  Bascom Service, RN Phone Number: 01/10/2024, 1:49 PM   Clinical Narrative:  [name] to [name] rep [name];LVM w/dtr [name] with call back # [name]-d/c summary sent;await rm#,report# prior PTAR. Confirmed patients medicare# in demographics [id number].  -2:10p- Just spoke to [name] dtr she is aware of d/c today to [name].u can call her if u need to [phone].thnx going to rm#[phone] report#[phone]. Calling PTAR.     Final next level of care: Skilled Nursing Facility Barriers to Discharge: No Barriers Identified   Patient Goals and CMS Choice Patient states their goals for this hospitalization and ongoing recovery are:: Rehab CMS Medicare.gov Compare Post Acute Care list provided to:: Patient Represenative (must comment) Choice offered to / list presented to : Patient Steve Brown ownership interest in [facility].provided to:: Patient    Discharge Placement                       Discharge Plan and Services Additional resources added to the After Visit Summary for   In-house Referral: Clinical Social Work Discharge Planning Services: NA Post Acute Care Choice: Skilled Nursing Facility          DME Arranged: N/A DME Agency: NA       HH Arranged: NA HH Agency: NA        Social Drivers of Health (SDOH) Interventions SDOH Screenings   Food Insecurity: No Food Insecurity (12/29/2023)  Housing: Low Risk (12/29/2023)  Transportation Needs: No Transportation Needs (12/29/2023)  Utilities: Not At Risk (12/29/2023)  Depression (PHQ2-9): Low Risk (12/14/2023)  Social Connections: Moderately Integrated (12/29/2023)  Tobacco Use: Low Risk (12/28/2023)     Readmission Risk Interventions    01/01/2024    2:26 PM 12/19/2023    3:48 PM  Readmission Risk Prevention Plan  Transportation  Screening Complete Complete  Medication Review (RN Care Manager) Complete Referral to Pharmacy  PCP or Specialist appointment within 3-5 days of discharge Complete Complete  HRI or Home Care Consult Complete Complete  SW Recovery Care/Counseling Consult Complete Complete  Palliative Care Screening Not Applicable Complete  Skilled Nursing Facility Complete Not Applicable

## 2024-01-10 NOTE — Discharge Summary (Signed)
 Physician Discharge Summary  Steve Brown FMW:[id number] DOB: 02-16-1940 DOA: 12/28/2023  PCP: [facility]  Admit date: 12/28/2023 Discharge date: 01/10/2024  Admitted From: Home Disposition: Skilled nursing facility  Recommendations for Outpatient Follow-up:  Follow up with PCP in 1-2 weeks Please obtain BMP/CBC in one week  Discharge Condition: Stable CODE STATUS: Full code Diet recommendation: Low-carb diet  Discharge summary: 84 year old with history of dementia, A-fib on Eliquis , BPH, type 2 diabetes, GERD, history of stroke, CKD stage IIIb, anxiety and depression presented to the ER with altered mental status, generalized weakness.  Lives alone.  He was found to be very weak and confused by maintenance man of the apartment so EMS was called.  In the emergency room, afebrile.  Blood pressure initially elevated.  Blood glucose 417.  Drug screen negative.  CT head negative.  Admitted due to significant symptoms.  Clinically improved.  Still in the hospital waiting to go to a skilled nursing facility.  Adequately stabilized.   Generalized weakness and deconditioning: Generalized debility, deconditioning due to less mobility.  Work with PT OT.  Referring to SNF for rehab.   Acute metabolic encephalopathy, resolved.  Patient with underlying dementia: Significant symptoms.  Needs assistance, rehab.   Severe hyperglycemia, uncontrolled type 2 diabetes: Presented with blood sugar of 417, anion gap of 16.  Patient was probably not taking insulin .  Started on low-dose Semglee  and sliding scale insulin .  Stable today. Will need further titration of medications from nursing home.   Community-acquired pneumonia: Present on admission.  Improved.  Completed course of antibiotics.   CKD stage IIIb: At about baseline.  Monitor closely.  Recheck in 1 week.   Paroxysmal A-fib: Rate controlled.  Therapeutic on Eliquis .   BPH: On finasteride  and Flomax  continue.   Anxiety depression:  On  Lexapro  and Remeron .  Continue.  Chronic pain syndrome: Verified use of chronic pain medication from outpatient.  He is not on Lyrica .  He will use Voltaren  gel, oral oxycodone  as needed.  Medically stabilized to transition to a skilled level of care.    Discharge Diagnoses:  Principal Problem:   Generalized weakness Active Problems:   Severe hyperglycemia due to diabetes mellitus (HCC)   Diabetes mellitus type 2 with complications (HCC)   Failure to thrive in adult   CKD stage 3b, GFR 30-44 ml/min (HCC)   Paroxysmal atrial fibrillation [facility])    Discharge Instructions  Discharge Instructions     Diet Carb Modified   Complete by: As directed    Increase activity slowly   Complete by: As directed       Allergies as of 01/10/2024       Reactions   Other Other (See Comments)   Per patient- cardiac cath dye-  woke up during procedure hysterical.   Phenergan  [promethazine ] Other (See Comments)   Syncope  Mood changes   Topamax [topiramate] Other (See Comments)   Hallucinations    Ultram  [tramadol ] Other (See Comments)   Greatly increased blood sugar        Medication List     STOP taking these medications    cephALEXin  250 MG capsule Commonly known as: KEFLEX    pregabalin  300 MG capsule Commonly known as: LYRICA    Tresiba  FlexTouch 100 UNIT/ML FlexTouch Pen Generic drug: insulin  degludec   valACYclovir  500 MG tablet Commonly known as: VALTREX        TAKE these medications    apixaban  5 MG Tabs tablet Commonly known as: ELIQUIS  Take 5 mg by mouth  2 (two) times daily.   diclofenac  Sodium 1 % Gel Commonly known as: VOLTAREN  Apply 4 g topically 4 (four) times daily. What changed:  when to take this reasons to take this   donepezil  10 MG tablet Commonly known as: ARICEPT  Take 10 mg by mouth daily.   escitalopram  20 MG tablet Commonly known as: LEXAPRO  Take 20 mg by mouth in the morning.   finasteride  5 MG tablet Commonly known as:  PROSCAR  Take 5 mg by mouth daily.   fluticasone  50 MCG/ACT nasal spray Commonly known as: FLONASE  Place 2 sprays into both nostrils daily as needed for allergies or rhinitis.   furosemide  20 MG tablet Commonly known as: LASIX  Take 1-2 tablets (20-40 mg total) by mouth daily as needed for fluid. What changed:  how much to take reasons to take this   HYDROcodone -acetaminophen  5-325 MG tablet Commonly known as: NORCO/VICODIN Take 1 tablet by mouth every 6 (six) hours as needed for severe pain (pain score 7-10). What changed:  how much to take when to take this   insulin  aspart 100 UNIT/ML FlexPen Commonly known as: NOVOLOG  Inject 0-11 Units into the skin 3 (three) times daily with meals. Check Blood Glucose (BG) and inject per scale: BG <150= 0 unit; BG 150-200= 1 unit; BG 201-250= 3 unit; BG 251-300= 5 unit; BG 301-350= 7 unit; BG 351-400= 9 unit; BG >400= 11 unit and Call Primary Care. What changed:  how much to take additional instructions   insulin  glargine-yfgn 100 UNIT/ML injection Commonly known as: SEMGLEE  Inject 0.1 mLs (10 Units total) into the skin daily. Start taking on: January 11, 2024   ketoconazole  2 % cream Commonly known as: NIZORAL  Apply 1 Application topically 2 (two) times daily as needed (skin irritation).   lidocaine  5 % Commonly known as: Lidoderm  Place 1 patch onto the skin daily. Remove & Discard patch within 12 hours or as directed by MD   losartan  25 MG tablet Commonly known as: COZAAR  Take 25 mg by mouth daily.   melatonin 5 MG Tabs Take 1 tablet (5 mg total) by mouth at bedtime as needed.   methocarbamol  500 MG tablet Commonly known as: ROBAXIN  Take 1-2 tablets (500-1,000 mg total) by mouth every 8 (eight) hours as needed for muscle spasms.   mirtazapine  15 MG tablet Commonly known as: REMERON  Take 7.5 mg by mouth at bedtime as needed (sleep, anxiety, nightmares).   Multivitamin Men 50+ Tabs Take 1 tablet by mouth daily.    pantoprazole  40 MG tablet Commonly known as: PROTONIX  Take 40 mg by mouth daily.   rosuvastatin  40 MG tablet Commonly known as: CRESTOR  Take 40 mg by mouth daily.   tamsulosin  0.4 MG Caps capsule Commonly known as: FLOMAX  Take 1 capsule (0.4 mg total) by mouth daily after supper.   VITAMIN D -3 PO Take 1 tablet by mouth daily.        Contact information for after-discharge care     Destination     [facility] .   Service: Skilled Nursing Contact information: [address] [place]   [address] [phone]                    Allergies[1]  Consultations: None   Procedures/Studies: DG Chest 2 View Result Date: 12/28/2023 CLINICAL DATA:  Altered.  Lethargy.  Recent pneumonia diagnosis. EXAM: CHEST - 2 VIEW COMPARISON:  Chest radiograph 12/17/2023 FINDINGS: Improved left lung base aeration with decreasing opacity in obscuration of the  hemidiaphragm. No new airspace disease. Stable heart size and mediastinal contours. Aortic atherosclerosis. No pulmonary edema. No pneumothorax. Stable osseous structures. IMPRESSION: Improved left lung base aeration with decreasing opacity. No new airspace disease. Electronically Signed   By: Andrea Gasman M.D.   On: 12/28/2023 17:51   CT Head Wo Contrast Result Date: 12/28/2023 CLINICAL DATA:  Lethargy. EXAM: CT HEAD WITHOUT CONTRAST TECHNIQUE: Contiguous axial images were obtained from the base of the skull through the vertex without intravenous contrast. RADIATION DOSE REDUCTION: This exam was performed according to the departmental dose-optimization program which includes automated exposure control, adjustment of the mA and/or kV according to patient size and/or use of iterative reconstruction technique. COMPARISON:  December 17, 2023 FINDINGS: Brain: There is generalized cerebral atrophy with widening of the extra-axial spaces and ventricular dilatation. There are areas of decreased  attenuation within the white matter tracts of the supratentorial brain, consistent with microvascular disease changes. A small chronic left cerebellar infarct is seen. Vascular: Marked severity bilateral cavernous carotid artery calcification is noted. Skull: Normal. Negative for fracture or focal lesion. Sinuses/Orbits: There is marked severity sphenoid sinus and left ethmoid sinus mucosal thickening. Other: A stable left frontal scalp lipoma is seen. IMPRESSION: 1. Generalized cerebral atrophy with chronic white matter small vessel ischemic changes. 2. Small chronic left cerebellar infarct. 3. Marked severity sphenoid sinus and left ethmoid sinus disease. Electronically Signed   By: Suzen Dials M.D.   On: 12/28/2023 16:49   ECHOCARDIOGRAM LIMITED Result Date: 12/18/2023    ECHOCARDIOGRAM LIMITED REPORT   Patient Name:   Steve Brown Date of Exam: 12/18/2023 Medical Rec #:  [id number]        Height:       65.0 in Accession #:    [id number]       Weight:       170.4 lb Date of Birth:  1940/11/07         BSA:          1.848 m Patient Age:    83 years         BP:           138/90 mmHg Patient Gender: M                HR:           65 bpm. Exam Location:  Inpatient Procedure: Limited Echo, Cardiac Doppler and Color Doppler (Both Spectral and            Color Flow Doppler were utilized during procedure). Indications:    Elevated Troponin  History:        Patient has prior history of Echocardiogram examinations, most                 recent 11/22/2023. CAD, TIA and Stroke; Risk                 Factors:Hypertension, Diabetes, Sleep Apnea and Dyslipidemia.                 CKD.  Sonographer:    Philomena Daring Referring Phys: WADDELL [facility]OR PARCELLS IMPRESSIONS  1. Left ventricular ejection fraction, by estimation, is 60 to 65%. The left ventricle has normal function. The left ventricle has no regional wall motion abnormalities. There is mild concentric left ventricular hypertrophy.  2. Right ventricular systolic function  is normal. The right ventricular size is normal.  3. The mitral valve is normal in structure. No evidence of mitral valve regurgitation. No evidence of  mitral stenosis.  4. Tricuspid valve regurgitation is mild to moderate.  5. The aortic valve is normal in structure. Aortic valve regurgitation is trivial. No aortic stenosis is present.  6. The inferior vena cava is normal in size with greater than 50% respiratory variability, suggesting right atrial pressure of 3 mmHg. FINDINGS  Left Ventricle: Left ventricular ejection fraction, by estimation, is 60 to 65%. The left ventricle has normal function. The left ventricle has no regional wall motion abnormalities. The left ventricular internal cavity size was normal in size. There is  mild concentric left ventricular hypertrophy. Right Ventricle: The right ventricular size is normal. No increase in right ventricular wall thickness. Right ventricular systolic function is normal. Left Atrium: Left atrial size was normal in size. Right Atrium: Right atrial size was normal in size. Pericardium: There is no evidence of pericardial effusion. Mitral Valve: The mitral valve is normal in structure. No evidence of mitral valve stenosis. Tricuspid Valve: The tricuspid valve is normal in structure. Tricuspid valve regurgitation is mild to moderate. No evidence of tricuspid stenosis. Aortic Valve: The aortic valve is normal in structure. Aortic valve regurgitation is trivial. No aortic stenosis is present. Pulmonic Valve: The pulmonic valve was normal in structure. Pulmonic valve regurgitation is not visualized. No evidence of pulmonic stenosis. Aorta: The aortic root is normal in size and structure. Venous: The inferior vena cava is normal in size with greater than 50% respiratory variability, suggesting right atrial pressure of 3 mmHg. IAS/Shunts: No atrial level shunt detected by color flow Doppler. Additional Comments: Spectral Doppler performed. Color Doppler performed.  LEFT  VENTRICLE PLAX 2D LVIDd:         3.60 cm   Diastology LVIDs:         2.40 cm   LV e' medial:    6.42 cm/s LV PW:         1.20 cm   LV E/e' medial:  8.2 LV IVS:        1.20 cm   LV e' lateral:   6.31 cm/s LVOT diam:     2.00 cm   LV E/e' lateral: 8.3 LV SV:         65 LV SV Index:   35 LVOT Area:     3.14 cm  RIGHT VENTRICLE             IVC RV S prime:     13.20 cm/s  IVC diam: 1.20 cm TAPSE (M-mode): 1.7 cm LEFT ATRIUM         Index LA diam:    2.70 cm 1.46 cm/m  AORTIC VALVE LVOT Vmax:   104.00 cm/s LVOT Vmean:  70.700 cm/s LVOT VTI:    0.207 m  AORTA Ao Root diam: 2.70 cm Ao Asc diam:  3.40 cm MITRAL VALVE               TRICUSPID VALVE MV Area (PHT): 3.42 cm    TR Peak grad:   27.0 mmHg MV Decel Time: 222 msec    TR Vmax:        260.00 cm/s MV E velocity: 52.40 cm/s MV A velocity: 66.60 cm/s  SHUNTS MV E/A ratio:  0.79        Systemic VTI:  0.21 m                            Systemic Diam: 2.00 cm Kardie Tobb DO Electronically signed by Kardie Tobb DO  Signature Date/Time: 12/18/2023/3:41:28 PM    Final    CT Head Wo Contrast Result Date: 12/17/2023 EXAM: CT HEAD WITHOUT CONTRAST 12/17/2023 05:47:29 PM TECHNIQUE: CT of the head was performed without the administration of intravenous contrast. Automated exposure control, iterative reconstruction, and/or weight based adjustment of the mA/kV was utilized to reduce the radiation dose to as low as reasonably achievable. COMPARISON: 11/21/2023 CLINICAL HISTORY: Headache, increasing frequency or severity. FINDINGS: BRAIN AND VENTRICLES: No acute hemorrhage. No evidence of acute infarct. Patchy and confluent decreased attenuation throughout deep and periventricular white matter of cerebral hemispheres bilaterally, compatible with chronic microvascular ischemic disease. Chronic left cerebellar infarction. Cerebral ventricle sizes concordant with degree of cerebral volume loss. No extra-axial collection. No mass effect or midline shift. ORBITS: Bilateral lens  replacement. No acute abnormality. SINUSES: Unchanged chronic right sphenoid sinusitis. Complete opacification of the right sphenoid sinus and partial opacification of the left sphenoid sinus. Significant mucosal thickening within the left posterior ethmoid air cells. Additional scattered mucosal thickening in the bilateral ethmoid and maxillary sinuses. SOFT TISSUES AND SKULL: Similar-appearing left frontal scalp lipoma. No acute soft tissue abnormality. No skull fracture. VASCULATURE: Atherosclerosis of the carotid siphons. IMPRESSION: 1. No acute intracranial abnormality. 2. 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 3. Unchanged chronic right sphenoid sinusitis with additional scattered mucosal thickening in the bilateral ethmoid and maxillary sinuses. Electronically signed by: Donnice Mania MD 12/17/2023 06:05 PM EST RP Workstation: [id number]   DG Chest Port 1 View Result Date: 12/17/2023 CLINICAL DATA:  Short of breath EXAM: PORTABLE CHEST 1 VIEW COMPARISON:  11/21/2023 FINDINGS: Single frontal view of the chest demonstrates an unremarkable cardiac silhouette. There is patchy opacity at the left lateral lung base obscuring the costophrenic angle, consistent with consolidation and/or small effusion. Right chest is clear. No pneumothorax. No acute bony abnormalities. IMPRESSION: 1. Patchy left lower lobe consolidation and/or small left pleural effusion, obscuring the left costophrenic angle. Electronically Signed   By: Ozell Daring M.D.   On: 12/17/2023 17:42   (Echo, Carotid, EGD, Colonoscopy, ERCP)    Subjective: Patient seen in the morning rounds.  Denies any complaints.  He is  agreeable to go to rehab.   Discharge Exam: Vitals:   01/10/24 0445 01/10/24 1315  BP: 116/85 125/65  Pulse: 64 66  Resp: 16 15  Temp: (!) 97.3 F (36.3 C) 98.3 F (36.8 C)  SpO2: 100% 94%   Vitals:   01/09/24 1222 01/09/24 1942 01/10/24 0445 01/10/24 1315  BP: 124/64  118/66 116/85 125/65  Pulse: 67 64 64 66  Resp: 12 16 16 15   Temp: 98.5 F (36.9 C) 98.4 F (36.9 C) (!) 97.3 F (36.3 C) 98.3 F (36.8 C)  TempSrc:  Oral Oral Oral  SpO2: 95% 100% 100% 94%  Weight:      Height:        General: Pt is alert, awake, not in acute distress Alert awake.  Oriented to himself.  Not oriented to time and situation. He has mostly flat affect. Cardiovascular: RRR, S1/S2 +, no rubs, no gallops Respiratory: CTA bilaterally, no wheezing, no rhonchi Abdominal: Soft, NT, ND, bowel sounds + Extremities: no edema, no cyanosis    The results of significant diagnostics from this hospitalization (including imaging, microbiology, ancillary and laboratory) are listed below for reference.     Microbiology: No results found for this or any previous visit (from the past 240 hours).   Labs: BNP (last 3 results) Recent Labs    11/21/23  2010 12/17/23 1758  BNP 541.3* 172.6*   Basic Metabolic Panel: Recent Labs  Lab 01/07/24 0537  NA 139  K 4.3  CL 102  CO2 26  GLUCOSE 190*  BUN 26*  CREATININE 1.86*  CALCIUM  8.7*   Liver Function Tests: Recent Labs  Lab 01/07/24 0537  AST 21  ALT 14  ALKPHOS 61  BILITOT 0.5  PROT 5.8*  ALBUMIN  3.2*   No results for input(s): LIPASE, AMYLASE in the last 168 hours. No results for input(s): AMMONIA in the last 168 hours. CBC: Recent Labs  Lab 01/07/24 0537  WBC 5.2  HGB 11.3*  HCT 33.0*  MCV 93.8  PLT 165   Cardiac Enzymes: No results for input(s): CKTOTAL, CKMB, CKMBINDEX, TROPONINI in the last 168 hours. BNP: Invalid input(s): POCBNP CBG: Recent Labs  Lab 01/09/24 1126 01/09/24 1638 01/09/24 2121 01/10/24 0755 01/10/24 1157  GLUCAP 197* 318* 169* 134* 201*   D-Dimer No results for input(s): DDIMER in the last 72 hours. Hgb A1c No results for input(s): HGBA1C in the last 72 hours. Lipid Profile No results for input(s): CHOL, HDL, LDLCALC, TRIG, CHOLHDL,  LDLDIRECT in the last 72 hours. Thyroid  function studies No results for input(s): TSH, T4TOTAL, T3FREE, THYROIDAB in the last 72 hours.  Invalid input(s): FREET3 Anemia work up No results for input(s): VITAMINB12, FOLATE, FERRITIN, TIBC, IRON, RETICCTPCT in the last 72 hours. Urinalysis    Component Value Date/Time   COLORURINE YELLOW 12/29/2023 1117   APPEARANCEUR CLEAR 12/29/2023 1117   LABSPEC 1.018 12/29/2023 1117   LABSPEC 1.015 12/08/2016 1408   PHURINE 5.0 12/29/2023 1117   GLUCOSEU >=500 (A) 12/29/2023 1117   GLUCOSEU Negative 12/08/2016 1408   HGBUR SMALL (A) 12/29/2023 1117   BILIRUBINUR NEGATIVE 12/29/2023 1117   BILIRUBINUR negative 07/05/2020 1611   BILIRUBINUR Negative 12/08/2016 1408   KETONESUR 20 (A) 12/29/2023 1117   PROTEINUR 30 (A) 12/29/2023 1117   UROBILINOGEN 0.2 07/05/2020 1611   UROBILINOGEN 0.2 12/08/2016 1408   NITRITE NEGATIVE 12/29/2023 1117   LEUKOCYTESUR NEGATIVE 12/29/2023 1117   LEUKOCYTESUR Moderate 12/08/2016 1408   Sepsis Labs Recent Labs  Lab 01/07/24 0537  WBC 5.2   Microbiology No results found for this or any previous visit (from the past 240 hours).   Time coordinating discharge: 35 minutes  SIGNED:   Renato Applebaum, MD  [facility] 01/10/2024, 1:37 PM     [1]  Allergies Allergen Reactions   Other Other (See Comments)    Per patient- cardiac cath dye-  woke up during procedure hysterical.   Phenergan  [Promethazine ] Other (See Comments)    Syncope  Mood changes   Topamax [Topiramate] Other (See Comments)    Hallucinations    Ultram  [Tramadol ] Other (See Comments)    Greatly increased blood sugar

## 2024-01-10 NOTE — Plan of Care (Signed)

## 2024-01-10 NOTE — Progress Notes (Signed)
 Report called to [employer]. Report given to [place], [place]

## 2024-01-23 ENCOUNTER — Ambulatory Visit: Admitting: Cardiology

## 2024-02-22 ENCOUNTER — Ambulatory Visit: Admitting: Cardiovascular Disease

## 2024-03-06 ENCOUNTER — Encounter: Admitting: Psychology

## 2024-04-28 ENCOUNTER — Ambulatory Visit: Admitting: Adult Health
